# Patient Record
Sex: Male | Born: 1940 | Race: White | Hispanic: No | Marital: Married | Smoking: Former smoker
Health system: Southern US, Community
[De-identification: ages and names within clinical notes are randomized; demographics above are authoritative.]

## PROBLEM LIST (undated history)

## (undated) DIAGNOSIS — I2510 Atherosclerotic heart disease of native coronary artery without angina pectoris: Secondary | ICD-10-CM

## (undated) DIAGNOSIS — D801 Nonfamilial hypogammaglobulinemia: Secondary | ICD-10-CM

## (undated) DIAGNOSIS — Z8679 Personal history of other diseases of the circulatory system: Secondary | ICD-10-CM

## (undated) DIAGNOSIS — E785 Hyperlipidemia, unspecified: Secondary | ICD-10-CM

## (undated) DIAGNOSIS — I10 Essential (primary) hypertension: Secondary | ICD-10-CM

## (undated) DIAGNOSIS — K219 Gastro-esophageal reflux disease without esophagitis: Secondary | ICD-10-CM

## (undated) DIAGNOSIS — I719 Aortic aneurysm of unspecified site, without rupture: Secondary | ICD-10-CM

## (undated) DIAGNOSIS — C61 Malignant neoplasm of prostate: Secondary | ICD-10-CM

## (undated) DIAGNOSIS — I519 Heart disease, unspecified: Secondary | ICD-10-CM

## (undated) DIAGNOSIS — Z8551 Personal history of malignant neoplasm of bladder: Secondary | ICD-10-CM

## (undated) DIAGNOSIS — R0602 Shortness of breath: Secondary | ICD-10-CM

## (undated) DIAGNOSIS — C8590 Non-Hodgkin lymphoma, unspecified, unspecified site: Secondary | ICD-10-CM

## (undated) DIAGNOSIS — I672 Cerebral atherosclerosis: Secondary | ICD-10-CM

## (undated) DIAGNOSIS — N320 Bladder-neck obstruction: Secondary | ICD-10-CM

## (undated) DIAGNOSIS — J449 Chronic obstructive pulmonary disease, unspecified: Secondary | ICD-10-CM

## (undated) DIAGNOSIS — F329 Major depressive disorder, single episode, unspecified: Secondary | ICD-10-CM

## (undated) DIAGNOSIS — I509 Heart failure, unspecified: Secondary | ICD-10-CM

## (undated) DIAGNOSIS — R918 Other nonspecific abnormal finding of lung field: Secondary | ICD-10-CM

## (undated) DIAGNOSIS — Z9889 Other specified postprocedural states: Secondary | ICD-10-CM

## (undated) DIAGNOSIS — M1990 Unspecified osteoarthritis, unspecified site: Secondary | ICD-10-CM

## (undated) DIAGNOSIS — R112 Nausea with vomiting, unspecified: Secondary | ICD-10-CM

## (undated) DIAGNOSIS — T4145XA Adverse effect of unspecified anesthetic, initial encounter: Secondary | ICD-10-CM

## (undated) DIAGNOSIS — F32A Depression, unspecified: Secondary | ICD-10-CM

## (undated) DIAGNOSIS — N200 Calculus of kidney: Secondary | ICD-10-CM

## (undated) DIAGNOSIS — I739 Peripheral vascular disease, unspecified: Secondary | ICD-10-CM

## (undated) DIAGNOSIS — B029 Zoster without complications: Secondary | ICD-10-CM

## (undated) DIAGNOSIS — I219 Acute myocardial infarction, unspecified: Secondary | ICD-10-CM

## (undated) DIAGNOSIS — R011 Cardiac murmur, unspecified: Secondary | ICD-10-CM

## (undated) DIAGNOSIS — C801 Malignant (primary) neoplasm, unspecified: Secondary | ICD-10-CM

## (undated) DIAGNOSIS — D649 Anemia, unspecified: Secondary | ICD-10-CM

## (undated) DIAGNOSIS — N321 Vesicointestinal fistula: Secondary | ICD-10-CM

## (undated) DIAGNOSIS — T8859XA Other complications of anesthesia, initial encounter: Secondary | ICD-10-CM

## (undated) DIAGNOSIS — I480 Paroxysmal atrial fibrillation: Secondary | ICD-10-CM

## (undated) DIAGNOSIS — I639 Cerebral infarction, unspecified: Secondary | ICD-10-CM

## (undated) DIAGNOSIS — G4730 Sleep apnea, unspecified: Secondary | ICD-10-CM

## (undated) HISTORY — PX: COLON SURGERY: SHX602

## (undated) HISTORY — DX: Zoster without complications: B02.9

## (undated) HISTORY — DX: Gastro-esophageal reflux disease without esophagitis: K21.9

## (undated) HISTORY — DX: Chronic obstructive pulmonary disease, unspecified: J44.9

## (undated) HISTORY — DX: Aortic aneurysm of unspecified site, without rupture: I71.9

## (undated) HISTORY — DX: Calculus of kidney: N20.0

## (undated) HISTORY — DX: Paroxysmal atrial fibrillation: I48.0

## (undated) HISTORY — DX: Anemia, unspecified: D64.9

## (undated) HISTORY — DX: Non-Hodgkin lymphoma, unspecified, unspecified site: C85.90

## (undated) HISTORY — DX: Vesicointestinal fistula: N32.1

## (undated) HISTORY — DX: Unspecified osteoarthritis, unspecified site: M19.90

## (undated) HISTORY — DX: Hyperlipidemia, unspecified: E78.5

## (undated) HISTORY — DX: Personal history of other diseases of the circulatory system: Z86.79

## (undated) HISTORY — DX: Acute myocardial infarction, unspecified: I21.9

## (undated) HISTORY — DX: Bladder-neck obstruction: N32.0

## (undated) HISTORY — DX: Cerebral atherosclerosis: I67.2

## (undated) HISTORY — PX: OTHER SURGICAL HISTORY: SHX169

## (undated) HISTORY — DX: Heart failure, unspecified: I50.9

## (undated) HISTORY — PX: PROSTATE SURGERY: SHX751

## (undated) HISTORY — PX: ROTATOR CUFF REPAIR: SHX139

## (undated) HISTORY — DX: Essential (primary) hypertension: I10

## (undated) HISTORY — PX: INSERT / REPLACE / REMOVE PACEMAKER: SUR710

## (undated) HISTORY — DX: Other nonspecific abnormal finding of lung field: R91.8

## (undated) HISTORY — DX: Nonfamilial hypogammaglobulinemia: D80.1

## (undated) HISTORY — PX: WRIST SURGERY: SHX841

## (undated) HISTORY — DX: Peripheral vascular disease, unspecified: I73.9

## (undated) HISTORY — PX: BLADDER SURGERY: SHX569

## (undated) HISTORY — DX: Malignant neoplasm of prostate: C61

## (undated) HISTORY — DX: Heart disease, unspecified: I51.9

## (undated) HISTORY — DX: Cardiac murmur, unspecified: R01.1

## (undated) SURGERY — ECHOCARDIOGRAM, TRANSESOPHAGEAL
Anesthesia: Moderate Sedation

---

## 1997-11-11 HISTORY — PX: OTHER SURGICAL HISTORY: SHX169

## 1998-11-11 DIAGNOSIS — C61 Malignant neoplasm of prostate: Secondary | ICD-10-CM

## 1998-11-11 HISTORY — DX: Malignant neoplasm of prostate: C61

## 2000-06-24 ENCOUNTER — Encounter: Payer: Self-pay | Admitting: *Deleted

## 2000-06-24 ENCOUNTER — Inpatient Hospital Stay (HOSPITAL_COMMUNITY): Admission: RE | Admit: 2000-06-24 | Discharge: 2000-06-26 | Payer: Self-pay | Admitting: Cardiovascular Disease

## 2000-06-24 HISTORY — PX: CORONARY ANGIOPLASTY: SHX604

## 2001-04-23 ENCOUNTER — Other Ambulatory Visit: Admission: RE | Admit: 2001-04-23 | Discharge: 2001-04-23 | Payer: Self-pay | Admitting: Urology

## 2001-04-23 ENCOUNTER — Encounter (INDEPENDENT_AMBULATORY_CARE_PROVIDER_SITE_OTHER): Payer: Self-pay | Admitting: Specialist

## 2001-05-05 ENCOUNTER — Ambulatory Visit: Admission: RE | Admit: 2001-05-05 | Discharge: 2001-08-03 | Payer: Self-pay | Admitting: Radiation Oncology

## 2001-06-17 ENCOUNTER — Encounter: Payer: Self-pay | Admitting: Urology

## 2001-06-18 ENCOUNTER — Encounter: Payer: Self-pay | Admitting: Urology

## 2001-06-18 ENCOUNTER — Ambulatory Visit (HOSPITAL_COMMUNITY): Admission: RE | Admit: 2001-06-18 | Discharge: 2001-06-18 | Payer: Self-pay | Admitting: Urology

## 2001-06-24 ENCOUNTER — Encounter (INDEPENDENT_AMBULATORY_CARE_PROVIDER_SITE_OTHER): Payer: Self-pay | Admitting: Specialist

## 2001-06-24 ENCOUNTER — Inpatient Hospital Stay (HOSPITAL_COMMUNITY): Admission: RE | Admit: 2001-06-24 | Discharge: 2001-06-29 | Payer: Self-pay | Admitting: Urology

## 2003-12-30 ENCOUNTER — Ambulatory Visit (HOSPITAL_COMMUNITY): Admission: RE | Admit: 2003-12-30 | Discharge: 2003-12-30 | Payer: Self-pay | Admitting: Internal Medicine

## 2005-04-30 ENCOUNTER — Ambulatory Visit (HOSPITAL_COMMUNITY): Admission: RE | Admit: 2005-04-30 | Discharge: 2005-04-30 | Payer: Self-pay | Admitting: Family Medicine

## 2005-08-27 ENCOUNTER — Ambulatory Visit (HOSPITAL_COMMUNITY): Admission: RE | Admit: 2005-08-27 | Discharge: 2005-08-27 | Payer: Self-pay | Admitting: Family Medicine

## 2006-04-08 ENCOUNTER — Ambulatory Visit (HOSPITAL_COMMUNITY): Admission: RE | Admit: 2006-04-08 | Discharge: 2006-04-08 | Payer: Self-pay | Admitting: Cardiovascular Disease

## 2006-04-17 ENCOUNTER — Ambulatory Visit (HOSPITAL_COMMUNITY): Payer: Self-pay | Admitting: Oncology

## 2006-04-17 ENCOUNTER — Encounter (HOSPITAL_COMMUNITY): Admission: RE | Admit: 2006-04-17 | Discharge: 2006-05-17 | Payer: Self-pay | Admitting: Oncology

## 2006-04-17 ENCOUNTER — Encounter: Admission: RE | Admit: 2006-04-17 | Discharge: 2006-04-17 | Payer: Self-pay | Admitting: Oncology

## 2006-04-22 ENCOUNTER — Ambulatory Visit (HOSPITAL_COMMUNITY): Admission: RE | Admit: 2006-04-22 | Discharge: 2006-04-22 | Payer: Self-pay | Admitting: Oncology

## 2006-05-15 ENCOUNTER — Encounter (INDEPENDENT_AMBULATORY_CARE_PROVIDER_SITE_OTHER): Payer: Self-pay | Admitting: General Surgery

## 2006-05-15 ENCOUNTER — Ambulatory Visit (HOSPITAL_COMMUNITY): Admission: RE | Admit: 2006-05-15 | Discharge: 2006-05-15 | Payer: Self-pay | Admitting: General Surgery

## 2006-05-15 ENCOUNTER — Encounter (INDEPENDENT_AMBULATORY_CARE_PROVIDER_SITE_OTHER): Payer: Self-pay | Admitting: Specialist

## 2006-05-20 ENCOUNTER — Encounter: Admission: RE | Admit: 2006-05-20 | Discharge: 2006-05-20 | Payer: Self-pay | Admitting: Oncology

## 2006-05-20 ENCOUNTER — Encounter (HOSPITAL_COMMUNITY): Admission: RE | Admit: 2006-05-20 | Discharge: 2006-06-19 | Payer: Self-pay | Admitting: Oncology

## 2006-05-26 ENCOUNTER — Ambulatory Visit (HOSPITAL_COMMUNITY): Admission: RE | Admit: 2006-05-26 | Discharge: 2006-05-26 | Payer: Self-pay | Admitting: Oncology

## 2006-06-16 ENCOUNTER — Ambulatory Visit (HOSPITAL_COMMUNITY): Payer: Self-pay | Admitting: Oncology

## 2006-07-07 ENCOUNTER — Encounter (HOSPITAL_COMMUNITY): Admission: RE | Admit: 2006-07-07 | Discharge: 2006-08-06 | Payer: Self-pay | Admitting: Oncology

## 2006-07-22 ENCOUNTER — Ambulatory Visit (HOSPITAL_COMMUNITY): Admission: RE | Admit: 2006-07-22 | Discharge: 2006-07-22 | Payer: Self-pay | Admitting: Oncology

## 2006-08-18 ENCOUNTER — Encounter: Admission: RE | Admit: 2006-08-18 | Discharge: 2006-08-18 | Payer: Self-pay | Admitting: Oncology

## 2006-08-18 ENCOUNTER — Ambulatory Visit (HOSPITAL_COMMUNITY): Payer: Self-pay | Admitting: Oncology

## 2006-08-18 ENCOUNTER — Encounter (HOSPITAL_COMMUNITY): Admission: RE | Admit: 2006-08-18 | Discharge: 2006-09-17 | Payer: Self-pay | Admitting: Oncology

## 2006-09-22 ENCOUNTER — Ambulatory Visit (HOSPITAL_COMMUNITY): Admission: RE | Admit: 2006-09-22 | Discharge: 2006-09-22 | Payer: Self-pay | Admitting: Oncology

## 2006-09-23 ENCOUNTER — Encounter (HOSPITAL_COMMUNITY): Admission: RE | Admit: 2006-09-23 | Discharge: 2006-10-23 | Payer: Self-pay | Admitting: Oncology

## 2006-09-23 ENCOUNTER — Encounter: Admission: RE | Admit: 2006-09-23 | Discharge: 2006-09-23 | Payer: Self-pay | Admitting: Oncology

## 2006-10-13 ENCOUNTER — Encounter (INDEPENDENT_AMBULATORY_CARE_PROVIDER_SITE_OTHER): Payer: Self-pay | Admitting: *Deleted

## 2006-10-13 ENCOUNTER — Inpatient Hospital Stay (HOSPITAL_COMMUNITY): Admission: RE | Admit: 2006-10-13 | Discharge: 2006-10-24 | Payer: Self-pay | Admitting: General Surgery

## 2006-11-05 ENCOUNTER — Ambulatory Visit (HOSPITAL_COMMUNITY): Payer: Self-pay | Admitting: Oncology

## 2006-12-17 ENCOUNTER — Encounter (HOSPITAL_COMMUNITY): Admission: RE | Admit: 2006-12-17 | Discharge: 2007-01-16 | Payer: Self-pay | Admitting: Oncology

## 2006-12-29 ENCOUNTER — Ambulatory Visit (HOSPITAL_COMMUNITY): Payer: Self-pay | Admitting: Oncology

## 2006-12-29 ENCOUNTER — Ambulatory Visit (HOSPITAL_COMMUNITY): Admission: RE | Admit: 2006-12-29 | Discharge: 2006-12-29 | Payer: Self-pay | Admitting: Oncology

## 2007-03-09 ENCOUNTER — Ambulatory Visit (HOSPITAL_COMMUNITY): Payer: Self-pay | Admitting: Oncology

## 2007-03-09 ENCOUNTER — Encounter (HOSPITAL_COMMUNITY): Admission: RE | Admit: 2007-03-09 | Discharge: 2007-04-08 | Payer: Self-pay | Admitting: Oncology

## 2007-05-12 ENCOUNTER — Ambulatory Visit (HOSPITAL_COMMUNITY): Payer: Self-pay | Admitting: Oncology

## 2007-06-24 ENCOUNTER — Encounter (HOSPITAL_COMMUNITY): Admission: RE | Admit: 2007-06-24 | Discharge: 2007-07-24 | Payer: Self-pay | Admitting: Oncology

## 2007-06-29 ENCOUNTER — Ambulatory Visit (HOSPITAL_COMMUNITY): Payer: Self-pay | Admitting: Oncology

## 2007-07-22 ENCOUNTER — Ambulatory Visit (HOSPITAL_COMMUNITY): Admission: RE | Admit: 2007-07-22 | Discharge: 2007-07-22 | Payer: Self-pay | Admitting: Family Medicine

## 2007-09-10 ENCOUNTER — Ambulatory Visit (HOSPITAL_COMMUNITY): Admission: RE | Admit: 2007-09-10 | Discharge: 2007-09-10 | Payer: Self-pay | Admitting: Oncology

## 2007-09-11 ENCOUNTER — Ambulatory Visit (HOSPITAL_COMMUNITY): Payer: Self-pay | Admitting: Oncology

## 2007-09-11 ENCOUNTER — Encounter (HOSPITAL_COMMUNITY): Admission: RE | Admit: 2007-09-11 | Discharge: 2007-10-11 | Payer: Self-pay | Admitting: Oncology

## 2007-11-13 ENCOUNTER — Ambulatory Visit (HOSPITAL_COMMUNITY): Admission: RE | Admit: 2007-11-13 | Discharge: 2007-11-13 | Payer: Self-pay | Admitting: Family Medicine

## 2007-11-16 ENCOUNTER — Ambulatory Visit (HOSPITAL_COMMUNITY): Payer: Self-pay | Admitting: Oncology

## 2007-11-27 HISTORY — PX: NM MYOCAR PERF WALL MOTION: HXRAD629

## 2008-02-08 ENCOUNTER — Ambulatory Visit (HOSPITAL_COMMUNITY): Payer: Self-pay | Admitting: Oncology

## 2008-02-08 ENCOUNTER — Encounter (HOSPITAL_COMMUNITY): Admission: RE | Admit: 2008-02-08 | Discharge: 2008-03-09 | Payer: Self-pay | Admitting: Oncology

## 2008-03-10 ENCOUNTER — Ambulatory Visit (HOSPITAL_COMMUNITY): Admission: RE | Admit: 2008-03-10 | Discharge: 2008-03-10 | Payer: Self-pay | Admitting: Family Medicine

## 2008-03-28 ENCOUNTER — Ambulatory Visit (HOSPITAL_COMMUNITY): Payer: Self-pay | Admitting: Oncology

## 2008-05-16 ENCOUNTER — Ambulatory Visit (HOSPITAL_COMMUNITY): Payer: Self-pay | Admitting: Oncology

## 2008-08-08 ENCOUNTER — Encounter (HOSPITAL_COMMUNITY): Admission: RE | Admit: 2008-08-08 | Discharge: 2008-09-07 | Payer: Self-pay | Admitting: Oncology

## 2008-08-08 ENCOUNTER — Ambulatory Visit (HOSPITAL_COMMUNITY): Payer: Self-pay | Admitting: Oncology

## 2008-08-22 ENCOUNTER — Ambulatory Visit (HOSPITAL_COMMUNITY): Admission: RE | Admit: 2008-08-22 | Discharge: 2008-08-22 | Payer: Self-pay | Admitting: Oncology

## 2008-09-19 ENCOUNTER — Encounter (HOSPITAL_COMMUNITY): Admission: RE | Admit: 2008-09-19 | Discharge: 2008-10-19 | Payer: Self-pay | Admitting: Oncology

## 2008-10-03 ENCOUNTER — Ambulatory Visit: Payer: Self-pay | Admitting: Thoracic Surgery

## 2008-10-03 ENCOUNTER — Ambulatory Visit (HOSPITAL_COMMUNITY): Admission: RE | Admit: 2008-10-03 | Discharge: 2008-10-03 | Payer: Self-pay | Admitting: Thoracic Surgery

## 2008-10-04 ENCOUNTER — Ambulatory Visit: Payer: Self-pay | Admitting: Thoracic Surgery

## 2008-11-24 ENCOUNTER — Ambulatory Visit (HOSPITAL_COMMUNITY): Admission: RE | Admit: 2008-11-24 | Discharge: 2008-11-24 | Payer: Self-pay | Admitting: Family Medicine

## 2009-02-27 ENCOUNTER — Ambulatory Visit (HOSPITAL_COMMUNITY): Payer: Self-pay | Admitting: Oncology

## 2009-02-27 ENCOUNTER — Encounter (HOSPITAL_COMMUNITY): Admission: RE | Admit: 2009-02-27 | Discharge: 2009-03-29 | Payer: Self-pay | Admitting: Oncology

## 2009-03-07 ENCOUNTER — Encounter (HOSPITAL_COMMUNITY): Payer: Self-pay | Admitting: Oncology

## 2009-03-09 ENCOUNTER — Ambulatory Visit (HOSPITAL_COMMUNITY): Admission: RE | Admit: 2009-03-09 | Discharge: 2009-03-09 | Payer: Self-pay | Admitting: Oncology

## 2009-03-29 ENCOUNTER — Ambulatory Visit (HOSPITAL_COMMUNITY): Admission: RE | Admit: 2009-03-29 | Discharge: 2009-03-29 | Payer: Self-pay | Admitting: Oncology

## 2009-03-31 ENCOUNTER — Encounter (HOSPITAL_COMMUNITY): Admission: RE | Admit: 2009-03-31 | Discharge: 2009-04-30 | Payer: Self-pay | Admitting: Oncology

## 2009-04-17 ENCOUNTER — Ambulatory Visit (HOSPITAL_COMMUNITY): Payer: Self-pay | Admitting: Oncology

## 2009-05-01 ENCOUNTER — Encounter (HOSPITAL_COMMUNITY): Admission: RE | Admit: 2009-05-01 | Discharge: 2009-05-31 | Payer: Self-pay | Admitting: Oncology

## 2009-06-06 ENCOUNTER — Ambulatory Visit (HOSPITAL_COMMUNITY): Payer: Self-pay | Admitting: Oncology

## 2009-06-06 ENCOUNTER — Encounter (HOSPITAL_COMMUNITY): Admission: RE | Admit: 2009-06-06 | Discharge: 2009-07-06 | Payer: Self-pay | Admitting: Oncology

## 2009-07-10 ENCOUNTER — Encounter (HOSPITAL_COMMUNITY): Admission: RE | Admit: 2009-07-10 | Discharge: 2009-08-10 | Payer: Self-pay | Admitting: Oncology

## 2009-07-26 HISTORY — PX: PORTACATH PLACEMENT: SHX2246

## 2009-07-29 ENCOUNTER — Ambulatory Visit (HOSPITAL_COMMUNITY): Payer: Self-pay | Admitting: Oncology

## 2009-08-10 ENCOUNTER — Other Ambulatory Visit (HOSPITAL_COMMUNITY): Payer: Self-pay | Admitting: Oncology

## 2009-09-11 ENCOUNTER — Encounter (HOSPITAL_COMMUNITY): Admission: RE | Admit: 2009-09-11 | Discharge: 2009-10-11 | Payer: Self-pay | Admitting: Oncology

## 2009-09-18 ENCOUNTER — Ambulatory Visit (HOSPITAL_COMMUNITY): Payer: Self-pay | Admitting: Oncology

## 2009-11-11 HISTORY — PX: BONE MARROW TRANSPLANT: SHX200

## 2009-11-24 ENCOUNTER — Encounter (HOSPITAL_COMMUNITY): Admission: RE | Admit: 2009-11-24 | Discharge: 2009-12-24 | Payer: Self-pay | Admitting: Oncology

## 2009-11-24 ENCOUNTER — Ambulatory Visit (HOSPITAL_COMMUNITY): Payer: Self-pay | Admitting: Oncology

## 2009-12-28 ENCOUNTER — Encounter (HOSPITAL_COMMUNITY): Admission: RE | Admit: 2009-12-28 | Discharge: 2010-01-27 | Payer: Self-pay | Admitting: Oncology

## 2010-01-18 ENCOUNTER — Ambulatory Visit (HOSPITAL_COMMUNITY): Payer: Self-pay | Admitting: Oncology

## 2010-01-24 ENCOUNTER — Ambulatory Visit (HOSPITAL_COMMUNITY): Admission: RE | Admit: 2010-01-24 | Discharge: 2010-01-24 | Payer: Self-pay | Admitting: Family Medicine

## 2010-01-25 ENCOUNTER — Encounter (HOSPITAL_COMMUNITY): Admission: RE | Admit: 2010-01-25 | Discharge: 2010-02-24 | Payer: Self-pay | Admitting: Oncology

## 2010-02-28 ENCOUNTER — Encounter (HOSPITAL_COMMUNITY): Admission: RE | Admit: 2010-02-28 | Discharge: 2010-03-30 | Payer: Self-pay | Admitting: Oncology

## 2010-03-14 ENCOUNTER — Encounter: Payer: Self-pay | Admitting: Internal Medicine

## 2010-03-19 ENCOUNTER — Ambulatory Visit (HOSPITAL_COMMUNITY): Admission: RE | Admit: 2010-03-19 | Discharge: 2010-03-19 | Payer: Self-pay | Admitting: Family Medicine

## 2010-03-21 DIAGNOSIS — Z8572 Personal history of non-Hodgkin lymphomas: Secondary | ICD-10-CM | POA: Insufficient documentation

## 2010-03-21 DIAGNOSIS — R972 Elevated prostate specific antigen [PSA]: Secondary | ICD-10-CM | POA: Insufficient documentation

## 2010-03-21 DIAGNOSIS — F411 Generalized anxiety disorder: Secondary | ICD-10-CM

## 2010-03-21 DIAGNOSIS — I10 Essential (primary) hypertension: Secondary | ICD-10-CM | POA: Insufficient documentation

## 2010-03-21 DIAGNOSIS — I219 Acute myocardial infarction, unspecified: Secondary | ICD-10-CM | POA: Insufficient documentation

## 2010-03-21 DIAGNOSIS — Z9889 Other specified postprocedural states: Secondary | ICD-10-CM | POA: Insufficient documentation

## 2010-03-21 DIAGNOSIS — C9001 Multiple myeloma in remission: Secondary | ICD-10-CM

## 2010-03-21 DIAGNOSIS — I2510 Atherosclerotic heart disease of native coronary artery without angina pectoris: Secondary | ICD-10-CM | POA: Insufficient documentation

## 2010-03-21 DIAGNOSIS — N200 Calculus of kidney: Secondary | ICD-10-CM

## 2010-03-22 ENCOUNTER — Ambulatory Visit: Payer: Self-pay | Admitting: Internal Medicine

## 2010-03-22 DIAGNOSIS — R930 Abnormal findings on diagnostic imaging of skull and head, not elsewhere classified: Secondary | ICD-10-CM

## 2010-03-22 DIAGNOSIS — J45909 Unspecified asthma, uncomplicated: Secondary | ICD-10-CM | POA: Insufficient documentation

## 2010-03-23 ENCOUNTER — Ambulatory Visit (HOSPITAL_COMMUNITY): Payer: Self-pay | Admitting: Oncology

## 2010-03-26 ENCOUNTER — Ambulatory Visit (HOSPITAL_COMMUNITY): Admission: RE | Admit: 2010-03-26 | Discharge: 2010-03-26 | Payer: Self-pay | Admitting: Oncology

## 2010-04-03 ENCOUNTER — Encounter (HOSPITAL_COMMUNITY): Admission: RE | Admit: 2010-04-03 | Discharge: 2010-05-03 | Payer: Self-pay | Admitting: Oncology

## 2010-04-03 ENCOUNTER — Ambulatory Visit: Payer: Self-pay | Admitting: Internal Medicine

## 2010-04-03 ENCOUNTER — Encounter: Payer: Self-pay | Admitting: Internal Medicine

## 2010-04-03 ENCOUNTER — Telehealth (INDEPENDENT_AMBULATORY_CARE_PROVIDER_SITE_OTHER): Payer: Self-pay | Admitting: *Deleted

## 2010-04-03 DIAGNOSIS — B370 Candidal stomatitis: Secondary | ICD-10-CM

## 2010-04-10 ENCOUNTER — Ambulatory Visit: Payer: Self-pay | Admitting: Internal Medicine

## 2010-04-18 ENCOUNTER — Telehealth: Payer: Self-pay | Admitting: Adult Health

## 2010-04-19 ENCOUNTER — Ambulatory Visit: Payer: Self-pay | Admitting: Internal Medicine

## 2010-04-20 LAB — CONVERTED CEMR LAB
Pro B Natriuretic peptide (BNP): 119 pg/mL — ABNORMAL HIGH (ref 0.0–100.0)
Sed Rate: 59 mm/hr — ABNORMAL HIGH (ref 0–22)

## 2010-04-26 ENCOUNTER — Ambulatory Visit: Payer: Self-pay | Admitting: Internal Medicine

## 2010-04-26 ENCOUNTER — Ambulatory Visit: Payer: Self-pay | Admitting: Cardiology

## 2010-04-26 DIAGNOSIS — R05 Cough: Secondary | ICD-10-CM

## 2010-05-10 ENCOUNTER — Ambulatory Visit: Payer: Self-pay | Admitting: Internal Medicine

## 2010-05-10 LAB — CONVERTED CEMR LAB
Basophils Absolute: 0 10*3/uL (ref 0.0–0.1)
Eosinophils Absolute: 0 10*3/uL (ref 0.0–0.7)
Hemoglobin: 10.6 g/dL — ABNORMAL LOW (ref 13.0–17.0)
Lymphocytes Relative: 24.3 % (ref 12.0–46.0)
MCHC: 33.5 g/dL (ref 30.0–36.0)
Monocytes Relative: 8.2 % (ref 3.0–12.0)
Neutro Abs: 2.7 10*3/uL (ref 1.4–7.7)
Neutrophils Relative %: 66.5 % (ref 43.0–77.0)
RBC: 3.27 M/uL — ABNORMAL LOW (ref 4.22–5.81)
RDW: 19.2 % — ABNORMAL HIGH (ref 11.5–14.6)

## 2010-05-15 ENCOUNTER — Ambulatory Visit (HOSPITAL_COMMUNITY): Payer: Self-pay | Admitting: Oncology

## 2010-05-15 ENCOUNTER — Encounter (HOSPITAL_COMMUNITY): Admission: RE | Admit: 2010-05-15 | Discharge: 2010-06-14 | Payer: Self-pay | Admitting: Oncology

## 2010-05-15 LAB — CONVERTED CEMR LAB: A-1 Antitrypsin, Ser: 182 mg/dL (ref 83–200)

## 2010-05-17 ENCOUNTER — Encounter: Payer: Self-pay | Admitting: Internal Medicine

## 2010-05-23 ENCOUNTER — Telehealth (INDEPENDENT_AMBULATORY_CARE_PROVIDER_SITE_OTHER): Payer: Self-pay | Admitting: *Deleted

## 2010-05-24 ENCOUNTER — Ambulatory Visit: Payer: Self-pay | Admitting: Internal Medicine

## 2010-05-31 ENCOUNTER — Encounter: Payer: Self-pay | Admitting: Internal Medicine

## 2010-06-18 ENCOUNTER — Ambulatory Visit (HOSPITAL_COMMUNITY): Admission: RE | Admit: 2010-06-18 | Discharge: 2010-06-18 | Payer: Self-pay | Admitting: Family Medicine

## 2010-06-28 ENCOUNTER — Encounter (HOSPITAL_COMMUNITY): Admission: RE | Admit: 2010-06-28 | Discharge: 2010-07-28 | Payer: Self-pay | Admitting: Oncology

## 2010-08-02 ENCOUNTER — Ambulatory Visit (HOSPITAL_COMMUNITY): Payer: Self-pay | Admitting: Hematology and Oncology

## 2010-08-02 ENCOUNTER — Ambulatory Visit (HOSPITAL_COMMUNITY): Payer: Self-pay | Admitting: Oncology

## 2010-08-02 ENCOUNTER — Encounter (HOSPITAL_COMMUNITY): Admission: RE | Admit: 2010-08-02 | Discharge: 2010-08-10 | Payer: Self-pay | Admitting: Oncology

## 2010-08-14 ENCOUNTER — Encounter (HOSPITAL_COMMUNITY): Admission: RE | Admit: 2010-08-14 | Discharge: 2010-09-13 | Payer: Self-pay | Admitting: Oncology

## 2010-08-14 ENCOUNTER — Encounter: Payer: Self-pay | Admitting: Internal Medicine

## 2010-09-25 ENCOUNTER — Encounter (HOSPITAL_COMMUNITY)
Admission: RE | Admit: 2010-09-25 | Discharge: 2010-10-25 | Payer: Self-pay | Source: Home / Self Care | Attending: Oncology | Admitting: Oncology

## 2010-09-25 ENCOUNTER — Ambulatory Visit (HOSPITAL_COMMUNITY): Payer: Self-pay | Admitting: Oncology

## 2010-10-25 ENCOUNTER — Encounter (HOSPITAL_COMMUNITY)
Admission: RE | Admit: 2010-10-25 | Discharge: 2010-11-24 | Payer: Self-pay | Source: Home / Self Care | Attending: Oncology | Admitting: Oncology

## 2010-11-13 ENCOUNTER — Encounter: Payer: Self-pay | Admitting: Internal Medicine

## 2010-11-13 ENCOUNTER — Ambulatory Visit (HOSPITAL_COMMUNITY)
Admission: RE | Admit: 2010-11-13 | Discharge: 2010-12-11 | Payer: Self-pay | Source: Home / Self Care | Attending: Oncology | Admitting: Oncology

## 2010-11-26 LAB — CBC
HCT: 36.4 % — ABNORMAL LOW (ref 39.0–52.0)
Hemoglobin: 12.4 g/dL — ABNORMAL LOW (ref 13.0–17.0)
MCH: 30.7 pg (ref 26.0–34.0)
MCHC: 34.1 g/dL (ref 30.0–36.0)
MCV: 90.1 fL (ref 78.0–100.0)
Platelets: 125 10*3/uL — ABNORMAL LOW (ref 150–400)
RBC: 4.04 MIL/uL — ABNORMAL LOW (ref 4.22–5.81)
RDW: 14.9 % (ref 11.5–15.5)
WBC: 3.8 10*3/uL — ABNORMAL LOW (ref 4.0–10.5)

## 2010-11-26 LAB — DIFFERENTIAL
Basophils Absolute: 0.1 10*3/uL (ref 0.0–0.1)
Basophils Relative: 2 % — ABNORMAL HIGH (ref 0–1)
Eosinophils Absolute: 0.2 10*3/uL (ref 0.0–0.7)
Eosinophils Relative: 6 % — ABNORMAL HIGH (ref 0–5)
Lymphocytes Relative: 49 % — ABNORMAL HIGH (ref 12–46)
Lymphs Abs: 1.9 10*3/uL (ref 0.7–4.0)
Monocytes Absolute: 0.3 10*3/uL (ref 0.1–1.0)
Monocytes Relative: 8 % (ref 3–12)
Neutro Abs: 1.3 10*3/uL — ABNORMAL LOW (ref 1.7–7.7)
Neutrophils Relative %: 34 % — ABNORMAL LOW (ref 43–77)

## 2010-11-26 LAB — COMPREHENSIVE METABOLIC PANEL
ALT: 27 U/L (ref 0–53)
AST: 33 U/L (ref 0–37)
Albumin: 4 g/dL (ref 3.5–5.2)
Alkaline Phosphatase: 49 U/L (ref 39–117)
BUN: 10 mg/dL (ref 6–23)
CO2: 26 mEq/L (ref 19–32)
Calcium: 9.1 mg/dL (ref 8.4–10.5)
Chloride: 103 mEq/L (ref 96–112)
Creatinine, Ser: 1.03 mg/dL (ref 0.4–1.5)
GFR calc Af Amer: >60 mL/min (ref >60)
GFR calc non Af Amer: >60 mL/min (ref >60)
Glucose, Bld: 65 mg/dL — ABNORMAL LOW (ref 70–99)
Potassium: 4.3 mEq/L (ref 3.5–5.1)
Sodium: 135 mEq/L (ref 135–145)
Total Bilirubin: 0.7 mg/dL (ref 0.3–1.2)
Total Protein: 6.1 g/dL (ref 6.0–8.3)

## 2010-11-26 LAB — IMMUNOFIXATION ELECTROPHORESIS
IgA: 75 mg/dL (ref 68–378)
IgG (Immunoglobin G), Serum: 705 mg/dL (ref 694–1618)
IgM, Serum: 25 mg/dL — ABNORMAL LOW (ref 60–263)
Total Protein ELP: 6.6 g/dL (ref 6.0–8.3)

## 2010-11-26 LAB — PROTEIN ELECTROPHORESIS, SERUM
Albumin ELP: 63 % (ref 55.8–66.1)
Alpha-1-Globulin: 5.4 % — ABNORMAL HIGH (ref 2.9–4.9)
Alpha-2-Globulin: 10.2 % (ref 7.1–11.8)
Beta 2: 4.1 % (ref 3.2–6.5)
Beta Globulin: 6.7 % (ref 4.7–7.2)
Gamma Globulin: 10.6 % — ABNORMAL LOW (ref 11.1–18.8)
M-Spike, %: 0.28 g/dL
Total Protein ELP: 6.4 g/dL (ref 6.0–8.3)

## 2010-12-02 ENCOUNTER — Encounter (HOSPITAL_COMMUNITY): Payer: Self-pay | Admitting: Oncology

## 2010-12-13 NOTE — Progress Notes (Signed)
Summary: ? meds  Phone Note Call from Patient Call back at [phone]   Caller: Patient Call For: tammy P Reason for Call: Talk to Nurse Summary of Call: Should pt continue [facility]?  Please call and advise. Initial call taken by: Eugene Gavia,  Apr 03, 2010 1:15 PM  Follow-up for Phone Call        Continue on dulera , brush/rinse and gargle after  use  make sure they read my  pt instruction sheet -it says that on it.  they can stop delsym and use mucinex dm instead  they should be taking mucinex dm two times a day , tessalon three times a day w/ option of hydrocodone as needed   Follow-up by: Rubye Oaks NP,  Apr 03, 2010 1:26 PM  Additional Follow-up for Phone Call Additional follow up Details #1::        called and spoke with pt's wife.  informed pt's wife of the above information/ TP's recs.  also, wife stated she did have pt instruction sheet, which she did review again and  denied any further questions.Wife verbalized understanding and denied any questions.  Arman Filter LPN  Apr 03, 2010 2:24 PM

## 2010-12-13 NOTE — Assessment & Plan Note (Signed)
Summary: Pulmonary/ ext f/u ov rechallenge with cipro x 10 d    Copy to:  Dr. Lubertha South Primary Provider/Referring Provider:  Dr. Lubertha South  CC:  2 wk followup.  Pt still c/o wheezing.  He states that he is coughing less- mainly only has cough in the am and late evening.  Cough is still prod with yellow sputum.Jeremy Parrish  History of Present Illness: 70 yowm quit smoking in 1996 due to MI no resp symptoms at that point. 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  rx with levaquin now 5/10 and pred 5/10 did help fever but not cough.  on advair and albuterol (but did not initially disclose this) no better.  Apr 03, 2010--Presents for follow up and med review. Pt did not bring his meds with him today.  He was seen last visit for initial pulmoanry consult for cough since 3/11. Last visit  does c/o cough that occ produces yellow mucus, wheezing, increased SOB and occ x77months. Cough is mainly during daytime, does not bother him at night. Last visit changed from advair to dulera. and oil based vitamins stopped.  His cough is no better.   He has been taken off of Revlimid for now --by Oncology due to low blood count per pt (no records are available at this time). He has finshed Levaquin and steroid taper. Does feel some better while on abx and steroids but cough does not go entirely away but when finshed his cough and ntermittent fevers return.   Apr 10, 2010--new med calendar- pt brought all meds with him today.  still coughing though is mostly dry now, wheezing and having SOB - states slightly improved since last OV  April 19, 2010-Presents for acute office visit. Complains that cough no better, now  producing clear/yelllow mucus, still wheezing and  having SOB.  states is worse than at last OV. PET 03/2010, w/ no evidence of reoccurence of lymphoma. 4 wk followup with PFT's.    rec prednisone, allegra and chlortrimeton  April 26, 2010 ov cc same since last ov.  He is still wheezing and coughing up yellow sputum.  States that symptoms are no worse, but no better on max rx for AB.  rec trial off dulera  page 2 May 10, 2010 ov says even augmentin did not affect the color the mucus and spends up to an hour and using hc every 4 hours during the day but no noct flare but noisy breathing.  cipro has worked the best on changing mucus from yellow to white.  Pt denies any significant sore throat, dysphagia, itching, sneezing,  nasal congestion or excess secretions,  fever, chills, sweats, unintended wt loss, pleuritic or exertional cp, hempoptysis, change in activity tolerance  orthopnea pnd or leg swelling. Pt also denies any obvious fluctuation in symptoms with weather or environmental change or other alleviating or aggravating factors.       Current Medications (verified): 1)  Centrum Silver  Tabs (Multiple Vitamins-Minerals) .... Take 1 Tablet By Mouth Once A Day 2)  Caltrate 600+d Plus 600-400 Mg-Unit Tabs (Calcium Carbonate-Vit D-Min) .... Take 1 Tablet By Mouth Two Times A Day 3)  Vitamin B-6 Cr 200 Mg  Cr-Tabs (Pyridoxine Hcl) .Jeremy Parrish.. 1 Once Daily 4)  Klor-Con M20 20 Meq Cr-Tabs (Potassium Chloride Crys Cr) .Jeremy Parrish.. 1 Two Times A Day 5)  Simvastatin 20 Mg Tabs (Simvastatin) .Jeremy Parrish.. 1 Once Daily 6)  Plavix 75 Mg Tabs (Clopidogrel Bisulfate) .Jeremy Parrish.. 1 Once Daily 7)  Amlodipine Besylate 10 Mg Tabs (Amlodipine Besylate) .Jeremy Parrish.. 1 Once Daily 8)  Gabapentin 600 Mg Tabs (Gabapentin) .Jeremy Parrish.. 1 Tablet By Mouth Morning, Noon and Evening and 2 By Mouth At Bedtime 9)  Acyclovir 800 Mg Tabs (Acyclovir) .... Take 1 Tablet By Mouth Two Times A Day 10)  Aspirin 81 Mg Tbec (Aspirin) .... Take 1 Tablet By Mouth Once A Day 11)  Ferrous Sulfate 325 (65 Fe) Mg Tabs (Ferrous Sulfate)  .Jeremy Parrish.. 1 Once Daily 12)  Citalopram Hydrobromide 40 Mg Tabs (Citalopram Hydrobromide) .Jeremy Parrish.. 1 Once Daily 13)  Zantac 150 Mg Tabs (Ranitidine Hcl) .... 2 At Bedtime 14)  Spironolactone 50 Mg Tabs (Spironolactone) .Jeremy Parrish.. 1 Once Daily 15)  Chlor-Trimeton 4 Mg Tabs (Chlorpheniramine Maleate) .Jeremy Parrish.. 1 Four Times A Day 16)  Dexilant 60 Mg Cpdr (Dexlansoprazole) .... Take  One 30-60 Min Before First Meal of The Day 17)  Tylenol Extra Strength 500 Mg Tabs (Acetaminophen) .... Per Bottle 18)  Hydrocodone-Acetaminophen 5-325 Mg Tabs (Hydrocodone-Acetaminophen) .Jeremy Parrish.. 1-2  Every 4 Hrs As Needed For Cough 19)  Alprazolam 0.5 Mg Tabs (Alprazolam) .... 1/2 To 1 Tab Two Times A Day As Needed 20)  Zolpidem Tartrate 10 Mg Tabs (Zolpidem Tartrate) .Jeremy Parrish.. 1 At Bedtime As Needed 21)  Flutter Valve .... Use As Directed  Allergies (verified): 1)  ! 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 MULTIPLE  MYELOMA (ICD-203.00) 2009 s/p bone marrow transplant autologous [facility] Oct 2010 New rx March 82956 = Revlimid  Dr Ignacia Bayley  HYPERTENSION (ICD-401.9) ANXIETY (ICD-300.00) CAD (ICD-414.00) Asthma/ Chronic cough  onset  01/2010     - HFA 25-75% Mar 22, 2010 with coaching     - Sinus CT April 26, 2010 mild thickening only, no AF levels     - Allergy profile sent May 10, 2010   Vital Signs:  Patient profile:   70 year old male Weight:      197 pounds O2 Sat:      97 % on Room air Temp:     97.9 degrees F oral Pulse rate:   69 / minute BP sitting:   100 / 58  (left arm)  Vitals Entered By: Vernie Murders (May 10, 2010 10:11 AM)  O2 Flow:  Room air  Physical Exam  Additional Exam:  amb wm nad with classic pseudowheeze improves with purse lip maneuver  wt 183 Mar 22, 2010 >>183 April 19, 2010 >> 189 April 26, 2010 > 197 May 10, 2010  HEENT mild turbinate edema.  Oropharynx -post pharynx w/ few scattered  white patches. No JVD or cervical adenopathy. Mild accessory muscle hypertrophy. Trachea midline, nl thryroid. Chest was hyperinflated by percussion with diminished breath sounds and moderate increased exp time with upper airway insp > exp wheeze. , scattered rhonchi., psuedowheezing  worse on forced exp. Regular rate and rhythm without murmur gallop or rub or increase P2 or edema.  Abd: no hsm, nl excursion. Ext warm without cyanosis or clubbing.     Impression & Recommendations:  Problem # 1:  COUGH (ICD-786.2) The most common causes of chronic cough in immunocompetent adults include:  upper airway cough syndrome (UACS), previously referred to as postnasal drip syndrome,  caused by variety of rhinosinus conditions; (2) asthma; (3) GERD; (4) chronic bronchitis from cigarette smoking or other inhaled environmental irritants; (5) nonasthmatic eosinophilic bronchitis; and (6) bronchiectasis. These conditions, singly or in combination, have accounted for up to 94% of the causes of chronic cough in prospective studies. This fits best with bronchiectasis and given his h/o best response to cipro may have resistant organisms or MAI but try 10 days of cipro then ? fob   Orders: TLB-Alpha-1-Antitrypsin Total ( Home Kit) MISC (04540) T-Allergy Profile Region II-[place], [place], [place], [place], [place] (5484) TLB-CBC Platelet - w/Differential (85025-CBCD) Est. Patient Level IV (98119) Prescription Created Electronically [phone])  Problem # 2:  ASTHMA, UNSPECIFIED (ICD-493.90) doubt asthma since no worse off dulera   Each maintenance medication was reviewed in detail including most importantly the difference between maintenance and as needed and under what circumstances the prns are to be used. This was done in the context of a medication calendar review which provided the patient with a user-friendly unambiguous mechanism for medication administration and reconciliation and provides an action plan for all active problems. It is  critical that this be shown to every doctor  for modification during the office visit if necessary so the patient can use it as a working document.   Medications Added to Medication List This Visit: 1)  Cipro 750 Mg Tabs (Ciprofloxacin hcl) .... One by mouth twice daily 2)  Prednisone 10 Mg Tabs (Prednisone) .... 4 each am x 2days, 2x2days, 1x2days and stop  Patient Instructions: 1)  Bronchiectasis means tubes are scarred and don't bring up mucus the way they should 2)  Stop chlortab 3)  Use delsym first then add Hydrocodone if needed 4)  Use flutter valve as much as possible 5)  Cipro 750 twice daily x 10 days then return in 2 weeks for ov 6)  Prednisone 10 mg  4 each am x 2days, 2x2days, 1x2days and stop  Prescriptions: HYDROCODONE-ACETAMINOPHEN 5-325 MG TABS (HYDROCODONE-ACETAMINOPHEN) 1-2  every 4 hrs as needed for cough  #40 x 0   Entered by:   Vernie Murders   Authorized by:   Nyoka Cowden MD   Signed by:   Vernie Murders on 05/10/2010   Method used:   Print then Give to Patient   RxID:   [id number] PREDNISONE 10 MG  TABS (PREDNISONE) 4 each am x 2days, 2x2days, 1x2days and stop  #14 x 0   Entered and Authorized by:   Nyoka Cowden MD   Signed by:   Nyoka Cowden MD on 05/10/2010   Method used:   Electronically to        [facility] [address] [phone]* (retail)       [address]       [place], [place]  [address]       Ph: [phone] or [phone]       Fax: [phone]   RxID:   [id number] CIPRO 750 MG  TABS (CIPROFLOXACIN HCL) One by mouth twice daily  #20 x 0   Entered and Authorized by:   Nyoka Cowden MD   Signed by:   Nyoka Cowden MD on 05/10/2010   Method used:   [facility] to        [facility] [phone]* (retail)       [address]  [place], [place]  [address]       Ph: [phone] or [phone]       Fax: [phone]   RxID:   [id number]

## 2010-12-13 NOTE — Assessment & Plan Note (Signed)
Summary: NP follow up - med calendar   Copy to:  Dr. Lubertha South Primary Provider/Referring Provider:  Dr. Lubertha South  CC:  new med calendar - pt did not bring his meds with him today.  pt does c/o cough that occ produces yellow mucus, wheezing, and increased SOB and occ x106months.  History of Present Illness: 70 yowm quit smoking in 1996 due to MI no resp symptoms at that point. 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  rx with levaquin now 5/10 and pred 5/10 did help fever but not cough.  on advair and albuterol (but did not initially disclose this) no better.  Apr 03, 2010--Presents for follow up and med review. Pt did not bring his meds with him today.  He was seen last visit for initial pulmoanry consult for cough since 3/11. Last visit  does c/o cough that occ produces yellow mucus, wheezing, increased SOB and occ x69months. Cough is mainly during daytime, does not bother him at night. Last visit changed from advair to dulera. and oil based vitamins stopped.  His cough is no better. Denies chest pain, dyspnea, orthopnea, hemoptysis, fever, n/v/d, edema, headache, discolored mucus. He does have intermittent fevers. He has been taken off of Revlimid for now by Oncology due to low blood count per pt (no records are available at this time). He has finshed Levaquin and steroid taper. Does feel some better while on abx and steroids but cough does not go entirely away but when finshed his cough and ntermittent fevers return.   Medications Prior to Update: 1)  Klor-Con M20 20 Meq Cr-Tabs (Potassium Chloride Crys Cr) .Marland Kitchen.. 1 Two Times A Day 2)  Citalopram Hydrobromide 40 Mg Tabs (Citalopram Hydrobromide) .Marland Kitchen.. 1 Once Daily 3)  Gabapentin 600 Mg  Tabs (Gabapentin) .Marland Kitchen.. 1 Four Times A Day 4)  Caltrate 600+d Plus 600-400 Mg-Unit Tabs (Calcium Carbonate-Vit D-Min) .Marland Kitchen.. 1 Once Daily 5)  Revlimid 10 Mg Caps (Lenalidomide) .Marland Kitchen.. 1 Once Daily 6)  Spironolactone 50 Mg Tabs (Spironolactone) .Marland Kitchen.. 1 Once Daily 7)  Bayer Aspirin 325 Mg Tabs (Aspirin) .Marland Kitchen.. 1 Once Daily 8)  Vitamin B-6 Cr 200 Mg Cr-Tabs (Pyridoxine Hcl) .Marland Kitchen.. 1 Once Daily 9)  Amlodipine Besylate 10 Mg Tabs (Amlodipine Besylate) .Marland Kitchen.. 1 Once Daily 10)  Simvastatin 20 Mg Tabs (Simvastatin) .Marland Kitchen.. 1 Once Daily 11)  Plavix 75 Mg Tabs (Clopidogrel Bisulfate) .Marland Kitchen.. 1 Once Daily 12)  Folic Acid 1 Mg Tabs (Folic Acid) .Marland Kitchen.. 1 Once Daily 13)  Ferrous Sulfate 325 (65 Fe) Mg Tabs (Ferrous Sulfate) .Marland Kitchen.. 1 Once Daily 14)  Dulera 100-5 Mcg/act Aero (Mometasone Furo-Formoterol Fum) .... 2 Puffs First Thing  in Am and 2 Puffs Again in Pm About 12 Hours Later 15)  Hydrocodone-Acetaminophen 5-325 Mg Tabs (Hydrocodone-Acetaminophen) .Marland Kitchen.. 1 Every 4-6 Hrs As Needed 16)  Zolpidem Tartrate 10 Mg Tabs (Zolpidem Tartrate) .Marland Kitchen.. 1 At Bedtime As Needed 17)  Alprazolam 0.5 Mg Tabs (Alprazolam) .... 1/2 To 1 Tab Two Times A Day As Needed 18)  Lorazepam 1 Mg Tabs (Lorazepam) .Marland Kitchen.. 1 Every 4 Hrs As Needed  Current Medications (verified): 1)  Klor-Con M20 20 Meq Cr-Tabs (Potassium Chloride Crys Cr) .Marland Kitchen.. 1 Two Times A  Day 2)  Citalopram Hydrobromide 40 Mg Tabs (Citalopram Hydrobromide) .Marland Kitchen.. 1 Once Daily 3)  Gabapentin 600 Mg Tabs (Gabapentin) .Marland Kitchen.. 1 Four Times A Day 4)  Caltrate 600+d Plus 600-400 Mg-Unit Tabs (Calcium Carbonate-Vit D-Min) .Marland Kitchen.. 1 Once Daily 5)  Revlimid 10 Mg Caps (Lenalidomide) .Marland Kitchen.. 1 Once Daily 6)  Spironolactone 50 Mg Tabs (Spironolactone) .Marland Kitchen.. 1 Once Daily As Needed 7)  Bayer Aspirin 325 Mg Tabs (Aspirin) .Marland Kitchen.. 1 Once Daily 8)  Vitamin B-6 Cr 200 Mg Cr-Tabs (Pyridoxine Hcl) .Marland Kitchen.. 1 Once Daily 9)  Amlodipine Besylate 10 Mg Tabs (Amlodipine Besylate) .Marland Kitchen.. 1 Once Daily 10)  Simvastatin 20 Mg Tabs  (Simvastatin) .Marland Kitchen.. 1 Once Daily 11)  Plavix 75 Mg Tabs (Clopidogrel Bisulfate) .Marland Kitchen.. 1 Once Daily 12)  Ferrous Sulfate 325 (65 Fe) Mg Tabs (Ferrous Sulfate) .Marland Kitchen.. 1 Once Daily 13)  Dulera 100-5 Mcg/act Aero (Mometasone Furo-Formoterol Fum) .... 2 Puffs First Thing  in Am and 2 Puffs Again in Pm About 12 Hours Later 14)  Hydrocodone-Acetaminophen 5-325 Mg Tabs (Hydrocodone-Acetaminophen) .Marland Kitchen.. 1 Every 4-6 Hrs As Needed 15)  Zolpidem Tartrate 10 Mg Tabs (Zolpidem Tartrate) .Marland Kitchen.. 1 At Bedtime As Needed 16)  Alprazolam 0.5 Mg Tabs (Alprazolam) .... 1/2 To 1 Tab Two Times A Day As Needed 17)  Acyclovir 800 Mg Tabs (Acyclovir) .... Take 1 Tablet By Mouth Two Times A Day 18)  Delsym 30 Mg/68ml Lqcr (Dextromethorphan Polistirex) .... Per Bottle  Allergies (verified): 1)  ! 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  Quit in 1996.  Smoked off and on x 30 yrs approx up to 1 1/2 ppd No ETOH Retired [employer] architect  Review of Systems      See HPI  Vital Signs:  Patient profile:   70 year old male Height:      67 inches Weight:      183 pounds BMI:     28.77 O2 Sat:      97 % on Room air Pulse rate:   65 / minute BP sitting:   142 / 80   (left arm) Cuff size:   regular  Vitals Entered By: Boone Master CNA/MA (Apr 03, 2010 9:15 AM)  O2 Flow:  Room air CC: new med calendar - pt did not bring his meds with him today.  pt does c/o cough that occ produces yellow mucus, wheezing, increased SOB and occ x59months Is Patient Diabetic? No Comments Medications reviewed with patient Daytime contact number verified with patient. Boone Master CNA/MA  Apr 03, 2010 9:15 AM  Ambulatory Pulse Oximetry  Resting; HR__70___    02 Sat__95%RA___  Lap1 (185 feet)   HR_70____   02 Sat__96%RA___ Lap2 (185 feet)   HR_77____   02 Sat__95%RA___    Lap3 (185 feet)   HR_79____   02 Sat__96%RA___  ___Test  Completed without Difficulty ___Test Stopped due to:     Physical Exam  Additional Exam:  amb wm nad with classic pseudowheeze resolves with purse lip maneuver  wt 183 Mar 22, 2010  HEENT mild turbinate edema.  Oropharynx -post pharynx w/ few scattered white patches. No JVD or cervical adenopathy. Mild accessory muscle hypertrophy. Trachea midline, nl thryroid. Chest was hyperinflated by percussion with diminished breath sounds and moderate increased exp time with upper airway insp > exp wheeze. , scattered rhonchi. Regular rate and rhythm without murmur gallop or rub or increase P2 or edema.  Abd: no hsm, nl excursion. Ext warm without cyanosis or clubbing.     Impression & Recommendations:  Problem # 1:  ASTHMA, UNSPECIFIED (ICD-493.90)  Asthmatic bronchitic flare w/ upper airway cough syndrome.  pt is somewheat complicated w/ underlying hx of multiple myleoma s/p bone  marrow transplant recently on Revlimid. 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  Hx of smoking-quit in 1996 No known hx of underlying COPD  no desaturations w/ walking today  will aim tx at slow bronchitic cough aggravated by possible GERD/rhinitis  REC:  Prednisone taper over next week.  Mucinex DM two times a day  Tessalon 200mg  three times a day  If  still coughing may use hydrocodone 1 every 4-6 hr as needed for breakthrough coughing.  Add Zantac 300mg  two times a day- 1 in am and 1 at bedtime  (no PPI -he is on plavix s/p multiple stents. ) Add Zyrtec 10mg  at bedtime  Mycelex troches 5x day for thrush Brush/rinse and gargle after inhaler use.  Try to use sugarless candy, ice chips, water to help w/ cough, try to avoid coughing and throat clearing.  NO MINTS  follow up 1 week and as needed , we will check spirometry (lung function test on return) Increase fluids use tylenol as needed  Please contact office for sooner follow up if symptoms do not improve or worsen   Orders: Depo- Medrol 40mg  (J1030) Depo- Medrol 80mg  (J1040) Admin of Therapeutic Inj  intramuscular or subcutaneous (56213) Est. Patient Level IV (08657) Prescription Created Electronically ([id number])  Problem # 2:  CANDIDIASIS, ORAL (ICD-112.0)  mycelex x 7 days rinse gargle brush after inhalers.   Orders: Est. Patient Level IV (29528) Prescription Created Electronically [phone])  Medications Added to Medication List This Visit: 1)  Spironolactone 50 Mg Tabs (Spironolactone) .Marland Kitchen.. 1 once daily as needed 2)  Acyclovir 800 Mg Tabs (Acyclovir) .... Take 1 tablet by mouth two times a day 3)  Delsym 30 Mg/82ml Lqcr (Dextromethorphan polistirex) .... Per bottle 4)  Tessalon 200 Mg Caps (Benzonatate) .Marland Kitchen.. 1 by mouth three times a day as needed for cough 5)  Prednisone 10 Mg Tabs (Prednisone) .... 4 tabs for 2 days, then 3 tabs for 2 days, 2 tabs for 2 days, then 1 tab for 2 days, then stop 6)  Mycelex 10 Mg Troc (Clotrimazole) .Marland Kitchen.. 1 by mouth 5 x day for 7 days for thrush.  Complete Medication List: 1)  Klor-con M20 20 Meq Cr-tabs (Potassium chloride crys cr) .Marland Kitchen.. 1 two times a day 2)  Citalopram Hydrobromide 40 Mg Tabs (Citalopram hydrobromide) .Marland Kitchen.. 1 once daily 3)  Gabapentin 600 Mg Tabs (Gabapentin) .Marland Kitchen.. 1 four times a day 4)  Caltrate 600+d Plus 600-400 Mg-unit Tabs  (Calcium carbonate-vit d-min) .Marland Kitchen.. 1 once daily 5)  Revlimid 10 Mg Caps (Lenalidomide) .Marland Kitchen.. 1 once daily 6)  Spironolactone 50 Mg Tabs (Spironolactone) .Marland Kitchen.. 1 once daily  as needed 7)  Bayer Aspirin 325 Mg Tabs (Aspirin) .Marland Kitchen.. 1 once daily 8)  Vitamin B-6 Cr 200 Mg Cr-tabs (Pyridoxine hcl) .Marland Kitchen.. 1 once daily 9)  Amlodipine Besylate 10 Mg Tabs (Amlodipine besylate) .Marland Kitchen.. 1 once daily 10)  Simvastatin 20 Mg Tabs (Simvastatin) .Marland Kitchen.. 1 once daily 11)  Plavix 75 Mg Tabs (Clopidogrel bisulfate) .Marland Kitchen.. 1 once daily 12)  Ferrous Sulfate 325 (65 Fe) Mg Tabs (Ferrous sulfate) .Marland Kitchen.. 1 once daily 13)  Dulera 100-5 Mcg/act Aero (Mometasone furo-formoterol fum) .... 2 puffs first thing  in am and 2 puffs again in pm about 12 hours later 14)  Hydrocodone-acetaminophen 5-325 Mg Tabs (Hydrocodone-acetaminophen) .Marland Kitchen.. 1 every 4-6 hrs as needed 15)  Zolpidem Tartrate 10 Mg Tabs (Zolpidem tartrate) .Marland Kitchen.. 1 at bedtime as needed 16)  Alprazolam 0.5 Mg Tabs (Alprazolam) .... 1/2 to 1 tab two times a day as needed 17)  Acyclovir 800 Mg Tabs (Acyclovir) .... Take 1 tablet by mouth two times a day 18)  Delsym 30 Mg/26ml Lqcr (Dextromethorphan polistirex) .... Per bottle 19)  Tessalon 200 Mg Caps (Benzonatate) .Marland Kitchen.. 1 by mouth three times a day as needed for cough 20)  Prednisone 10 Mg Tabs (Prednisone) .... 4 tabs for 2 days, then 3 tabs for 2 days, 2 tabs for 2 days, then 1 tab for 2 days, then stop 21)  Mycelex 10 Mg Troc (Clotrimazole) .Marland Kitchen.. 1 by mouth 5 x day for 7 days for thrush.  Patient Instructions: 1)  Prednisone taper over next week.  2)  Mucinex DM two times a day  3)  Tessalon 200mg  three times a day  4)  If still coughing may use hydrocodone 1 every 4-6 hr as needed for breakthrough coughing.  5)  Add Zantac 300mg  two times a day- 1 in am and 1 at bedtime  6)  Add Zyrtec 10mg  at bedtime  7)  Mycelex troches 5x day for thrush 8)  Brush/rinse and gargle after inhaler use.  9)  Try to use sugarless candy, ice chips,  water to help w/ cough, try to avoid coughing and throat clearing.  10)  NO MINTS  11)  follow up 1 week and as needed , we will check spirometry (lung function test on return) 12)  Increase fluids use tylenol as needed  13)  Please contact office for sooner follow up if symptoms do not improve or worsen  Prescriptions: MYCELEX 10 MG TROC (CLOTRIMAZOLE) 1 by mouth 5 x day for 7 days for thrush.  #35 x 0   Entered and Authorized by:   Rubye Oaks NP   Signed by:   Aylene Acoff NP on 04/03/2010   Method used:   Print then Give to Patient   RxID:   [id number] PREDNISONE 10 MG TABS (PREDNISONE) 4 tabs for 2 days, then 3 tabs for 2 days, 2 tabs for 2 days, then 1 tab for 2 days, then stop  #20 x 0   Entered and Authorized by:   Rubye Oaks NP   Signed by:   Arabelle Bollig NP on 04/03/2010   Method used:   Print then Give to Patient   RxID:   [id number] TESSALON 200 MG CAPS (BENZONATATE) 1 by mouth three times a day as needed for cough  #30 x 0   Entered and Authorized by:   Rubye Oaks NP   Signed by:   Socorro Kanitz NP on 04/03/2010   Method used:   Print then Give to Patient  RxID:   [id number]    Immunization History:  Influenza Immunization History:    Influenza:  historical (02/09/2010)  Pneumovax Immunization History:    Pneumovax:  historical (02/09/2010)    Medication Administration  Injection # 1:    Medication: Depo- Medrol 80mg     Diagnosis: ASTHMA, UNSPECIFIED (ICD-493.90)    Route: IM    Site: LUOQ gluteus    Exp Date: 09/11/2012    Lot #: [id number]    Mfr: [employer]    Patient tolerated injection without complications    Given by: Denna Haggard, CMA (Apr 03, 2010 10:31 AM)  Injection # 2:    Medication: Depo- Medrol 40mg     Diagnosis: ASTHMA, UNSPECIFIED (ICD-493.90)    Route: IM    Site: LUOQ gluteus    Exp Date: 09/11/2012    Lot #: [id number]    Mfr: [employer]    Patient tolerated injection without complications    Given by:  Denna Haggard, CMA (Apr 03, 2010 10:32 AM)  Orders Added: 1)  Depo- Medrol 40mg  [J1030] 2)  Depo- Medrol 80mg  [J1040] 3)  Admin of Therapeutic Inj  intramuscular or subcutaneous [96372] 4)  Est. Patient Level IV [30865] 5)  Prescription Created Electronically [phone]

## 2010-12-13 NOTE — Assessment & Plan Note (Signed)
Summary: Acute NP office visit - cough, asthma   Copy to:  Dr. Lubertha South Primary Provider/Referring Provider:  Dr. Lubertha South  CC:  cough no better, no producing clear/yelllow mucus, and still wheezing and having SOB.  states is worse than at last OV.Marland Kitchen  History of Present Illness: 70 yowm quit smoking in 1996 due to MI no resp symptoms at that point. 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  rx with levaquin now 5/10 and pred 5/10 did help fever but not cough.  on advair and albuterol (but did not initially disclose this) no better.  Apr 03, 2010--Presents for follow up and med review. Pt did not bring his meds with him today.  He was seen last visit for initial pulmoanry consult for cough since 3/11. Last visit  does c/o cough that occ produces yellow mucus, wheezing, increased SOB and occ x22months. Cough is mainly during daytime, does not bother him at night. Last visit changed from advair to dulera. and oil based vitamins stopped.  His cough is no better.   He has been taken off of Revlimid for now --by Oncology due to low blood count per pt (no records are available at this time). He has finshed Levaquin and steroid taper. Does feel some better while on abx and steroids but cough does not go entirely away but when finshed his cough and ntermittent fevers return.   Apr 10, 2010--new med calendar- pt brought all meds with him today.  still coughing though is mostly dry now, wheezing and having SOB - states slightly improved since last OV  April 19, 2010-Presents for acute office visit. Complains that cough no better, now  producing clear/yelllow mucus, still wheezing and having SOB.  states is worse than at last OV. PET 03/2010, w/ no  evidence of reoccurence of lymphoma. He is using mucinex dm and tessalon around the clock without stopping cough. Does have nasal drainage/drip despite zyrtec once daily. Mucus is mainly yellow early in am, then clears. Denies chest pain,  orthopnea, hemoptysis, fever, n/v/d, headache.   Medications Prior to Update: 1)  Dulera 100-5 Mcg/act Aero (Mometasone Furo-Formoterol Fum) .... 2 Puffs First Thing  in Am and 2 Puffs Again in Pm About 12 Hours Later 2)  Centrum Silver  Tabs (Multiple Vitamins-Minerals) .... Take 1 Tablet By Mouth Once A Day 3)  Caltrate 600+d Plus 600-400 Mg-Unit Tabs (Calcium Carbonate-Vit D-Min) .... Take 1 Tablet By Mouth Two Times A Day 4)  Vitamin B-6 Cr 200 Mg Cr-Tabs (Pyridoxine Hcl) .Marland Kitchen.. 1 Once Daily 5)  Klor-Con M20 20 Meq Cr-Tabs (Potassium Chloride Crys Cr) .Marland Kitchen.. 1 Two Times A Day 6)  Simvastatin 20 Mg Tabs (Simvastatin) .Marland Kitchen.. 1 Once Daily 7)  Plavix 75 Mg Tabs (Clopidogrel Bisulfate) .Marland Kitchen.. 1 Once Daily 8)  Amlodipine Besylate 10 Mg Tabs (Amlodipine Besylate) .Marland Kitchen.. 1 Once Daily 9)  Gabapentin 600 Mg Tabs (Gabapentin) .Marland Kitchen.. 1 Four Times A Day 10)  Acyclovir 800 Mg Tabs (Acyclovir) .... Take 1 Tablet By Mouth Two Times A Day 11)  Aspirin 81 Mg Tbec (Aspirin) .... Take 1 Tablet By Mouth Once A Day 12)  Ferrous Sulfate 325 (65 Fe) Mg Tabs (Ferrous  Sulfate) .Marland Kitchen.. 1 Once Daily 13)  Citalopram Hydrobromide 40 Mg Tabs (Citalopram Hydrobromide) .Marland Kitchen.. 1 Once Daily 14)  Zantac 150 Mg Tabs (Ranitidine Hcl) .... Take 1 Tablet By Mouth Two Times A Day 15)  Spironolactone 50 Mg Tabs (Spironolactone) .Marland Kitchen.. 1 Once Daily 16)  Mucinex Dm 30-600 Mg Xr12h-Tab (Dextromethorphan-Guaifenesin) .... Take 2 Tablets Every 12 Hours As Needed 17)  Tessalon 200 Mg Caps (Benzonatate) .Marland Kitchen.. 1 By Mouth Every 8 Hours As Needed 18)  Zyrtec Allergy 10 Mg Tabs (Cetirizine Hcl) .... Take 1 Tab By Mouth At Bedtime As Needed 19)  Tylenol Extra Strength 500 Mg Tabs (Acetaminophen) .... Per Bottle 20)   Hydrocodone-Acetaminophen 5-325 Mg Tabs (Hydrocodone-Acetaminophen) .Marland Kitchen.. 1 Every 4 Hrs As Needed 21)  Alprazolam 0.5 Mg Tabs (Alprazolam) .... 1/2 To 1 Tab Two Times A Day As Needed 22)  Zolpidem Tartrate 10 Mg Tabs (Zolpidem Tartrate) .Marland Kitchen.. 1 At Bedtime As Needed  Current Medications (verified): 1)  Dulera 100-5 Mcg/act Aero (Mometasone Furo-Formoterol Fum) .... 2 Puffs First Thing  in Am and 2 Puffs Again in Pm About 12 Hours Later 2)  Centrum Silver  Tabs (Multiple Vitamins-Minerals) .... Take 1 Tablet By Mouth Once A Day 3)  Caltrate 600+d Plus 600-400 Mg-Unit Tabs (Calcium Carbonate-Vit D-Min) .... Take 1 Tablet By Mouth Two Times A Day 4)  Vitamin B-6 Cr 200 Mg Cr-Tabs (Pyridoxine Hcl) .Marland Kitchen.. 1 Once Daily 5)  Klor-Con M20 20 Meq Cr-Tabs (Potassium Chloride Crys Cr) .Marland Kitchen.. 1 Two Times A Day 6)  Simvastatin 20 Mg Tabs (Simvastatin) .Marland Kitchen.. 1 Once Daily 7)  Plavix 75 Mg Tabs (Clopidogrel Bisulfate) .Marland Kitchen.. 1 Once Daily 8)  Amlodipine Besylate 10 Mg Tabs (Amlodipine Besylate) .Marland Kitchen.. 1 Once Daily 9)  Gabapentin 600 Mg Tabs (Gabapentin) .Marland Kitchen.. 1 Tablet By Mouth Morning, Noon and Evening and 2 By Mouth At Bedtime 10)  Acyclovir 800 Mg Tabs (Acyclovir) .... Take 1 Tablet By Mouth Two Times A Day 11)  Aspirin 81 Mg Tbec (Aspirin) .... Take 1 Tablet By Mouth Once A Day 12)  Ferrous Sulfate 325 (65 Fe) Mg Tabs (Ferrous Sulfate) .Marland Kitchen.. 1 Once Daily 13)  Citalopram Hydrobromide 40 Mg Tabs (Citalopram Hydrobromide) .Marland Kitchen.. 1 Once Daily 14)  Zantac 150 Mg Tabs (Ranitidine Hcl) .... Take 1 Tablet By Mouth Two Times A Day 15)  Spironolactone 50 Mg Tabs (Spironolactone) .Marland Kitchen.. 1 Once Daily 16)  Mucinex Dm 30-600 Mg Xr12h-Tab (Dextromethorphan-Guaifenesin) .... Take 2 Tablets Every 12 Hours As Needed 17)  Tessalon 200 Mg Caps (Benzonatate) .Marland Kitchen.. 1 By Mouth Every 8 Hours As Needed 18)  Zyrtec Allergy 10 Mg Tabs (Cetirizine Hcl) .... Take 1 Tab By Mouth At Bedtime As Needed 19)  Tylenol Extra Strength 500 Mg Tabs (Acetaminophen)  .... Per Bottle 20)  Hydrocodone-Acetaminophen 5-325 Mg Tabs (Hydrocodone-Acetaminophen) .Marland Kitchen.. 1 Every 4 Hrs As Needed 21)  Alprazolam 0.5 Mg Tabs (Alprazolam) .... 1/2 To 1 Tab Two Times A Day As Needed 22)  Zolpidem Tartrate 10 Mg Tabs (Zolpidem Tartrate) .Marland Kitchen.. 1 At Bedtime As Needed  Allergies (verified): 1)  ! 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  Quit in 1996.  Smoked off and on x 30 yrs approx up to 1 1/2 ppd No ETOH Retired [employer] architect  Review of Systems      See HPI  Vital Signs:  Patient profile:   70 year old male Height:      67 inches Weight:      184 pounds BMI:     28.92 O2 Sat:      99 % on Room air Temp:     98.2 degrees F oral Pulse rate:   68 / minute BP sitting:   114 / 72  (left arm) Cuff size:   regular  Vitals Entered By: Boone Master CNA/MA (April 19, 2010 11:38 AM)  O2 Flow:  Room air CC: cough no better, no producing clear/yelllow mucus, still wheezing and having SOB.  states is worse than at last OV. Is Patient Diabetic? No Comments Medications reviewed with patient Daytime contact number verified with patient. Boone Master CNA/MA  April 19, 2010 11:38 AM    Physical  Exam  Additional Exam:  amb wm nad with classic pseudowheeze resolves with purse lip maneuver  wt 183 Mar 22, 2010 >>183 April 19, 2010  HEENT mild turbinate edema.  Oropharynx -post pharynx w/ few scattered white patches. No JVD or cervical adenopathy. Mild accessory muscle hypertrophy. Trachea midline, nl thryroid. Chest was hyperinflated by percussion with diminished breath sounds and moderate increased exp time with upper airway insp > exp wheeze. , scattered rhonchi., psuedowheezing on forced exp. Regular rate and rhythm without murmur gallop or rub or increase P2 or edema.  Abd: no hsm, nl excursion. Ext warm without cyanosis or clubbing.     Impression & Recommendations:  Problem # 1:  ASTHMA, UNSPECIFIED (ICD-493.90)  Slow to resolve Asthmatic bronchitic flare w/ upper airway cough syndrome.  pt is somewheat complicated w/ underlying hx of multiple myleoma s/p bone  marrow transplant recently on Revlimid. 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 Pet scan w/ no evidence of reoccurrence.  BC neg. sputum cx few weeks ago showed few candidia and strep pnuem -tx w/ mycelex and levaquin. no more fevers since finishing this.  Hx of smoking-quit in 1996 No known hx of underlying COPD  no desaturations w/ ambuliation .  will aim tx at GERD/rhinitis/cough prevention , can not use PPI d/t on plavix. al will change zyrtec to allegra, may add chlor tab at at bedtime.  also slow predniosne taper. may also use hydrocodone for breakthrough cough.  May need CT sinus in future-will hold for now, he has had alot of scans and finished abx courses that would cover sinusitis REC:  Prednisone taper over next week.  Change zyrtec to Allegra 180mg  once daily  May add chlor tabs 4mg  2 tabs at bedtime as needed drainage/throat clearing.  May add hydrocodone to cough prevention regimen.   Brush/rinse and gargle after inhaler use.  Try to use sugarless candy, ice chips, water to help w/ cough, try to  avoid coughing and throat clearing.  NO MINTS  Follow med calendar closely and bring back to each visit.  follow up Dr. Sherene Sires in 1 week  and as needed Please contact  office for sooner follow up if symptoms do not improve or worsen  will check bnp and esr     Orders: TLB-BNP (B-Natriuretic Peptide) (83880-BNPR) TLB-Sedimentation Rate (ESR) (85652-ESR) Est. Patient Level IV (16109)  Medications Added to Medication List This Visit: 1)  Gabapentin 600 Mg Tabs (Gabapentin) .Marland Kitchen.. 1 tablet by mouth morning, noon and evening and 2 by mouth at bedtime 2)  Prednisone 10 Mg Tabs (Prednisone) .... 4 tabs for 3 days, then 3 tabs for 3 days, 2 tabs for 3 days, then 1 tab for 3 days, then stop  Complete Medication List: 1)  Dulera 100-5 Mcg/act Aero (Mometasone furo-formoterol fum) .... 2 puffs first thing  in am and 2 puffs again in pm about 12 hours later 2)  Centrum Silver Tabs (Multiple vitamins-minerals) .... Take 1 tablet by mouth once a day 3)  Caltrate 600+d Plus 600-400 Mg-unit Tabs (Calcium carbonate-vit d-min) .... Take 1 tablet by mouth two times a day 4)  Vitamin B-6 Cr 200 Mg Cr-tabs (Pyridoxine hcl) .Marland Kitchen.. 1 once daily 5)  Klor-con M20 20 Meq Cr-tabs (Potassium chloride crys cr) .Marland Kitchen.. 1 two times a day 6)  Simvastatin 20 Mg Tabs (Simvastatin) .Marland Kitchen.. 1 once daily 7)  Plavix 75 Mg Tabs (Clopidogrel bisulfate) .Marland Kitchen.. 1 once daily 8)  Amlodipine Besylate 10 Mg Tabs (Amlodipine besylate) .Marland Kitchen.. 1 once daily 9)  Gabapentin 600 Mg Tabs (Gabapentin) .Marland Kitchen.. 1 tablet by mouth morning, noon and evening and 2 by mouth at bedtime 10)  Acyclovir 800 Mg Tabs (Acyclovir) .... Take 1 tablet by mouth two times a day 11)  Aspirin 81 Mg Tbec (Aspirin) .... Take 1 tablet by mouth once a day 12)  Ferrous Sulfate 325 (65 Fe) Mg Tabs (Ferrous sulfate) .Marland Kitchen.. 1 once daily 13)  Citalopram Hydrobromide 40 Mg Tabs (Citalopram hydrobromide) .Marland Kitchen.. 1 once daily 14)  Zantac 150 Mg Tabs (Ranitidine hcl) .... Take 1 tablet by mouth  two times a day 15)  Spironolactone 50 Mg Tabs (Spironolactone) .Marland Kitchen.. 1 once daily 16)  Mucinex Dm 30-600 Mg Xr12h-tab (Dextromethorphan-guaifenesin) .... Take 2 tablets every 12 hours as needed 17)  Tessalon 200 Mg Caps (Benzonatate) .Marland Kitchen.. 1 by mouth every 8 hours as needed 18)  Zyrtec Allergy 10 Mg Tabs (Cetirizine hcl) .... Take 1 tab by mouth at bedtime as needed 19)  Tylenol Extra Strength 500 Mg Tabs (Acetaminophen) .... Per bottle 20)  Hydrocodone-acetaminophen 5-325 Mg Tabs (Hydrocodone-acetaminophen) .Marland Kitchen.. 1 every 4 hrs as needed 21)  Alprazolam 0.5 Mg Tabs (Alprazolam) .... 1/2 to 1 tab two times a day as needed 22)  Zolpidem Tartrate 10 Mg Tabs (Zolpidem tartrate) .Marland Kitchen.. 1 at bedtime as needed 23)  Prednisone 10 Mg Tabs (Prednisone) .... 4 tabs for 3 days, then 3 tabs for 3 days, 2 tabs for 3 days, then 1 tab for 3 days, then stop  Patient Instructions: 1)  Prednisone taper over next week.  2)  Change zyrtec to Allegra 180mg  once daily  3)  May add chlor tabs 4mg  2 tabs at bedtime as needed drainage/throat clearing.  4)  May add hydrocodone to cough prevention regimen.  5)   Brush/rinse and gargle after inhaler use.  6)  Try to use sugarless candy, ice chips, water to help w/ cough, try to avoid coughing and throat clearing.  7)  NO MINTS  8)  Follow med calendar closely and bring back to each visit.  9)  follow up Dr. Sherene Sires in 1 week  and as needed 10)  Please contact office for sooner follow up if symptoms do not improve or worsen  Prescriptions: HYDROCODONE-ACETAMINOPHEN 5-325 MG TABS (HYDROCODONE-ACETAMINOPHEN) 1 every 4 hrs as needed  #45 x 0   Entered and Authorized by:   Rubye Oaks NP   Signed by:   Love Chowning NP on 04/19/2010   Method used:   Print then Give to Patient   RxID:   [id number] PREDNISONE 10 MG TABS (PREDNISONE) 4 tabs for 3 days, then 3 tabs for 3 days, 2 tabs for 3 days, then 1 tab for 3 days, then stop  #30 x 0   Entered and Authorized by:   Rubye Oaks NP   Signed by:   Paizleigh Wilds NP on 04/19/2010   Method used:   Electronically to        [facility] [phone]* (retail)       [address]       [place], [place]  [address]       Ph: [phone] or [phone]       Fax: [phone]   RxID:   [id number]   Appended Document: Acute NP office visit - cough, asthma     Medication Administration  Medication # 1:    Medication: Xopenex 1.25mg     Diagnosis: ASTHMA, UNSPECIFIED (ICD-493.90)    Dose: 1    Route: po    Exp Date: 04/19/2010    Lot #: [id number]    Mfr: [email or web address]    Patient tolerated medication without complications    Given by: Denna Haggard, CMA (April 19, 2010 12:25 PM)  Orders Added: 1)  Xopenex 1.25mg  [Y6063]   Appended Document: Acute NP office visit - cough, asthma Patient xopenex exp date was recorded as 04/19/10...Marland KitchenMarland Kitchenthe actual exp date should read September 2011  Appended Document: med calendar update    Clinical Lists Changes  Medications: Added new medication of ALLEGRA 180 MG TABS (FEXOFENADINE HCL) Take 1 tablet by mouth once a day Changed medication from TESSALON 200 MG CAPS (BENZONATATE) 1 by mouth every 8 hours as needed to BENZONATATE 100 MG CAPS (BENZONATATE) 2 every 8 hours as needed Changed medication from ZYRTEC ALLERGY 10 MG TABS (CETIRIZINE HCL) Take 1 tab by mouth at bedtime as needed to CHLOR-TRIMETON 4 MG TABS (CHLORPHENIRAMINE MALEATE) 2 tabs by mouth at as needed Removed medication of PREDNISONE 10 MG TABS (PREDNISONE) 4 tabs for 3 days, then 3 tabs for 3 days, 2 tabs for 3 days, then 1 tab for 3 days, then stop

## 2010-12-13 NOTE — Assessment & Plan Note (Signed)
Summary: NP follow up - med calendar   Copy to:  Dr. Lubertha South Primary Provider/Referring Provider:  Dr. Lubertha South  CC:  new med calendar- pt brought all meds with him today.  still coughing though is mostly dry now and wheezing and having SOB - states slightly improved since last OV.  History of Present Illness: 70 yowm quit smoking in 1996 due to MI no resp symptoms at that point. 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  rx with levaquin now 5/10 and pred 5/10 did help fever but not cough.  on advair and albuterol (but did not initially disclose this) no better.  Apr 03, 2010--Presents for follow up and med review. Pt did not bring his meds with him today.  He was seen last visit for initial pulmoanry consult for cough since 3/11. Last visit  does c/o cough that occ produces yellow mucus, wheezing, increased SOB and occ x52months. Cough is mainly during daytime, does not bother him at night. Last visit changed from advair to dulera. and oil based vitamins stopped.  His cough is no better.   He has been taken off of Revlimid for now --by Oncology due to low blood count per pt (no records are available at this time). He has finshed Levaquin and steroid taper. Does feel some better while on abx and steroids but cough does not go entirely away but when finshed his cough and ntermittent fevers return.   Apr 10, 2010-- new med calendar- pt brought all meds with him today.  still coughing though is mostly dry now, wheezing and having SOB - states slightly improved since last OV  Medications Prior to Update: 1)  Dulera 100-5 Mcg/act Aero (Mometasone Furo-Formoterol Fum) .... 2 Puffs First Thing  in Am and 2 Puffs Again in Pm About 12 Hours  Later 2)  Caltrate 600+d Plus 600-400 Mg-Unit Tabs (Calcium Carbonate-Vit D-Min) .Marland Kitchen.. 1 Once Daily 3)  Vitamin B-6 Cr 200 Mg Cr-Tabs (Pyridoxine Hcl) .Marland Kitchen.. 1 Once Daily 4)  Klor-Con M20 20 Meq Cr-Tabs (Potassium Chloride Crys Cr) .Marland Kitchen.. 1 Two Times A Day 5)  Simvastatin 20 Mg Tabs (Simvastatin) .Marland Kitchen.. 1 Once Daily 6)  Plavix 75 Mg Tabs (Clopidogrel Bisulfate) .Marland Kitchen.. 1 Once Daily 7)  Amlodipine Besylate 10 Mg Tabs (Amlodipine Besylate) .Marland Kitchen.. 1 Once Daily 8)  Gabapentin 600 Mg Tabs (Gabapentin) .Marland Kitchen.. 1 Four Times A Day 9)  Acyclovir 800 Mg Tabs (Acyclovir) .... Take 1 Tablet By Mouth Two Times A Day 10)  Bayer Aspirin 325 Mg Tabs (Aspirin) .Marland Kitchen.. 1 Once Daily 11)  Ferrous Sulfate 325 (65 Fe) Mg Tabs (Ferrous Sulfate) .Marland Kitchen.. 1 Once Daily 12)  Citalopram Hydrobromide 40 Mg Tabs (Citalopram Hydrobromide) .Marland Kitchen.. 1 Once Daily 13)  Spironolactone 50 Mg Tabs (Spironolactone) .Marland Kitchen.. 1 Once Daily As Needed 14)  Tessalon 200 Mg Caps (Benzonatate) .Marland Kitchen.. 1 By Mouth Three Times A Day As Needed For Cough 15)  Hydrocodone-Acetaminophen 5-325 Mg Tabs (Hydrocodone-Acetaminophen) .Marland Kitchen.. 1 Every 4-6 Hrs As Needed 16)  Alprazolam 0.5 Mg Tabs (Alprazolam) .... 1/2 To 1 Tab Two Times A Day As Needed 17)  Zolpidem Tartrate 10 Mg Tabs (Zolpidem Tartrate) .Marland Kitchen.. 1 At Bedtime As Needed 18)  Revlimid 10 Mg Caps (Lenalidomide) .Marland Kitchen.. 1 Once Daily 19)  Delsym 30 Mg/31ml Lqcr (Dextromethorphan Polistirex) .... Per Bottle 20)  Prednisone 10 Mg Tabs (Prednisone) .... 4 Tabs For 2 Days, Then 3 Tabs For 2 Days, 2 Tabs For 2 Days, Then 1 Tab For 2 Days, Then Stop 21)  Mycelex 10 Mg Troc (Clotrimazole) .Marland Kitchen.. 1 By Mouth 5 X Day For 7 Days For Thrush.  Current Medications (verified): 1)  Dulera 100-5 Mcg/act Aero (Mometasone Furo-Formoterol Fum) .... 2 Puffs First Thing  in Am and 2 Puffs Again in Pm About 12 Hours Later 2)  Centrum Silver  Tabs (Multiple Vitamins-Minerals) .... Take 1 Tablet By Mouth Once A Day 3)  Caltrate 600+d Plus 600-400 Mg-Unit Tabs  (Calcium Carbonate-Vit D-Min) .... Take 1 Tablet By Mouth Two Times A Day 4)  Vitamin B-6 Cr 200 Mg Cr-Tabs (Pyridoxine Hcl) .Marland Kitchen.. 1 Once Daily 5)  Klor-Con M20 20 Meq Cr-Tabs (Potassium Chloride Crys Cr) .Marland Kitchen.. 1 Two Times A Day 6)  Simvastatin 20 Mg Tabs (Simvastatin) .Marland Kitchen.. 1 Once Daily 7)  Plavix 75 Mg Tabs (Clopidogrel Bisulfate) .Marland Kitchen.. 1 Once Daily 8)  Amlodipine Besylate 10 Mg Tabs (Amlodipine Besylate) .Marland Kitchen.. 1 Once Daily 9)  Gabapentin 600 Mg Tabs (Gabapentin) .Marland Kitchen.. 1 Four Times A Day 10)  Acyclovir 800 Mg Tabs (Acyclovir) .... Take 1 Tablet By Mouth Two Times A Day 11)  Aspirin 81 Mg Tbec (Aspirin) .... Take 1 Tablet By Mouth Once A Day 12)  Ferrous Sulfate 325 (65 Fe) Mg Tabs (Ferrous Sulfate) .Marland Kitchen.. 1 Once Daily 13)  Citalopram Hydrobromide 40 Mg Tabs (Citalopram Hydrobromide) .Marland Kitchen.. 1 Once Daily 14)  Zantac 150 Mg Tabs (Ranitidine Hcl) .... Take 1 Tablet By Mouth Two Times A Day 15)  Spironolactone 50 Mg Tabs (Spironolactone) .Marland Kitchen.. 1 Once Daily 16)  Mucinex Dm 30-600 Mg Xr12h-Tab (Dextromethorphan-Guaifenesin) .... Take 2 Tablets Every 12 Hours As Needed 17)  Tessalon 200 Mg Caps (Benzonatate) .Marland Kitchen.. 1 By Mouth Every 8 Hours As Needed 18)  Zyrtec Allergy 10 Mg Tabs (Cetirizine Hcl) .... Take 1 Tab By Mouth At Bedtime As Needed 19)  Tylenol Extra Strength 500 Mg Tabs (Acetaminophen) .... Per Bottle 20)  Hydrocodone-Acetaminophen 5-325 Mg Tabs (Hydrocodone-Acetaminophen) .Marland Kitchen.. 1 Every 4 Hrs As Needed 21)  Alprazolam 0.5 Mg Tabs (Alprazolam) .... 1/2 To 1 Tab Two Times A Day As Needed 22)  Zolpidem Tartrate 10 Mg Tabs (Zolpidem Tartrate) .Marland Kitchen.. 1 At Bedtime As Needed  Allergies (verified): 1)  ! Benadryl  Past History:  Family History: Last updated: 03/22/2010 Heart dz- Father Negative for respiratory diseases or atopy   Social History: Last updated: 03/22/2010 Married Children Former smoker.  Quit in 1996.  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  still coughing though is mostly dry now, wheezing and having SOB -  states slightly improved since last OV Is Patient Diabetic? No Comments Medications reviewed with patient Daytime contact number verified with patient. Boone Master CNA/MA  Apr 10, 2010 2:06 PM     Pulmonary Function Test Date: 04/10/2010 2:12 PM Gender: Male  Pre-Spirometry FVC    Value: 2.88 L/min   % Pred: 71.50 % FEV1    Value: 2.03 L     Pred: 2.98 L     % Pred: 67.90 % FEV1/FVC  Value: 70.33 %     % Pred: 94.80 %  Impression & Recommendations:  Problem # 1:  CANDIDIASIS, ORAL (ICD-112.0)  Medications Added to Medication List This Visit: 1)  Centrum Silver Tabs (Multiple vitamins-minerals) .... Take 1 tablet by mouth once a day 2)  Caltrate 600+d Plus 600-400 Mg-unit Tabs (Calcium carbonate-vit d-min) .... Take 1 tablet by mouth two times a day 3)  Aspirin 81 Mg Tbec (Aspirin) .... Take 1 tablet by mouth once a day 4)  Zantac 150 Mg Tabs (Ranitidine hcl) .... Take 1 tablet by mouth two times a day 5)  Spironolactone 50 Mg Tabs (Spironolactone) .Marland Kitchen.. 1 once daily 6)  Mucinex Dm 30-600 Mg Xr12h-tab (Dextromethorphan-guaifenesin) .... Take 2 tablets every 12 hours as needed 7)  Tessalon 200 Mg Caps (Benzonatate) .Marland Kitchen.. 1 by mouth every 8 hours as needed 8)  Zyrtec Allergy 10 Mg Tabs (Cetirizine hcl) .... Take 1 tab by mouth at bedtime as needed 9)  Tylenol Extra Strength 500 Mg Tabs (Acetaminophen) .... Per bottle 10)  Hydrocodone-acetaminophen 5-325 Mg Tabs (Hydrocodone-acetaminophen) .Marland Kitchen.. 1 every 4 hrs  as needed  Other Orders: Est. Patient Level III (16109)  Patient Instructions: 1)  Finish mycelex.   2)  Brush/rinse and gargle after inhaler use.  3)  Try to use sugarless candy, ice chips, water to help w/ cough, try to avoid coughing and throat clearing.  4)  NO MINTS  5)  Follow med calendar closely and bring back to each visit.  6)  follow up Dr. 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

## 2010-12-13 NOTE — Letter (Signed)
Summary: [facility]   Imported By: Curtis Sites 04/26/2010 11:15:17  _____________________________________________________________________  External Attachment:    Type:   Image     Comment:   External Document

## 2010-12-13 NOTE — Progress Notes (Signed)
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  LMOMTCB  Initial call taken by: Vernie Murders,  May 23, 2010 12:45 PM  Follow-up for Phone Call        d/w pt at appt today Follow-up by: Vernie Murders,  May 24, 2010 11:20 AM

## 2010-12-13 NOTE — Letter (Signed)
Summary: [facility]  [facility]   Imported By: Sherian Rein 09/04/2010 09:55:28  _____________________________________________________________________  External Attachment:    Type:   Image     Comment:   External Document

## 2010-12-13 NOTE — Assessment & Plan Note (Signed)
Summary: Pulmonary/ ext ov - try off dulera, max gerd rx   Copy to:  Dr. Lubertha South Primary Provider/Referring Provider:  Dr. Lubertha South  CC:  1 wk followup.  Pt states that he is doing about the same since last ov.  He is still wheezing and coughing up yellow sputum.  States that symptoms are no worse.  Jeremy Parrish  History of Present Illness: 70 yowm quit smoking in 1996 due to MI no resp symptoms at that point. 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  rx with levaquin now 5/10 and pred 5/10 did help fever but not cough.  on advair and albuterol (but did not initially disclose this) no better.  Apr 03, 2010--Presents for follow up and med review. Pt did not bring his meds with him today.  He was seen last visit for initial pulmoanry consult for cough since 3/11. Last visit  does c/o cough that occ produces yellow mucus, wheezing, increased SOB and occ x16months. Cough is mainly during daytime, does not bother him at night. Last visit changed from advair to dulera. and oil based vitamins stopped.  His cough is no better.   He has been taken off of Revlimid for now --by Oncology due to low blood count per pt (no records are available at this time). He has finshed Levaquin and steroid taper. Does feel some better while on abx and steroids but cough does not go entirely away but when finshed his cough and ntermittent fevers return.   Apr 10, 2010--new med calendar- pt brought all meds with him today.  still coughing though is mostly dry now, wheezing and having SOB - states slightly improved since last OV  April 19, 2010-Presents for acute office visit. Complains that cough no better, now  producing clear/yelllow mucus, still wheezing and having SOB.   states is worse than at last OV. PET 03/2010, w/ no evidence of reoccurence of lymphoma. 4 wk followup with PFT's.    rec prednisone, allegra and chlortrimeton  April 26, 2010 ov cc same since last ov.  He is still wheezing and coughing up yellow sputum.  States that symptoms are no worse, but no better on max rx for AB.  Pt denies any significant sore throat, dysphagia, itching, sneezing  Current Medications (verified): 1)  Dulera 100-5 Mcg/act Aero (Mometasone Furo-Formoterol Fum) .... 2 Puffs First Thing  in Am and 2 Puffs Again in Pm About 12 Hours Later 2)  Centrum Silver  Tabs (Multiple Vitamins-Minerals) .... Take 1 Tablet By Mouth Once A Day 3)  Caltrate 600+d Plus 600-400 Mg-Unit Tabs (Calcium Carbonate-Vit D-Min) .... Take 1 Tablet By Mouth Two Times A Day 4)  Vitamin B-6 Cr 200 Mg Cr-Tabs (Pyridoxine Hcl) .Jeremy Parrish.. 1 Once Daily 5)  Klor-Con M20 20 Meq Cr-Tabs (Potassium Chloride Crys Cr) .Jeremy Parrish.. 1 Two Times A Day 6)  Simvastatin 20 Mg Tabs (Simvastatin) .Jeremy Parrish.. 1 Once Daily 7)  Plavix 75 Mg Tabs (Clopidogrel Bisulfate) .Jeremy Parrish.. 1 Once Daily 8)  Amlodipine Besylate 10 Mg Tabs (Amlodipine Besylate) .Jeremy Parrish.. 1 Once Daily 9)  Gabapentin 600 Mg Tabs (Gabapentin) .Jeremy Parrish.. 1 Tablet By Mouth Morning, Noon and Evening and 2 By Mouth At Bedtime  10)  Acyclovir 800 Mg Tabs (Acyclovir) .... Take 1 Tablet By Mouth Two Times A Day 11)  Aspirin 81 Mg Tbec (Aspirin) .... Take 1 Tablet By Mouth Once A Day 12)  Ferrous Sulfate 325 (65 Fe) Mg Tabs (Ferrous Sulfate) .Jeremy Parrish.. 1 Once Daily 13)  Citalopram Hydrobromide 40 Mg Tabs (Citalopram Hydrobromide) .Jeremy Parrish.. 1 Once Daily 14)  Zantac 150 Mg Tabs (Ranitidine Hcl) .... Take 1 Tablet By Mouth Two Times A Day 15)  Spironolactone 50 Mg Tabs (Spironolactone) .Jeremy Parrish.. 1 Once Daily 16)  Allegra 180 Mg Tabs (Fexofenadine Hcl) .... Take 1 Tablet By Mouth Once A Day 17)  Mucinex Dm 30-600 Mg Xr12h-Tab (Dextromethorphan-Guaifenesin) .... Take 2 Tablets Every 12 Hours As Needed 18)  Benzonatate 100 Mg  Caps (Benzonatate) .... 2 Every 8 Hours As Needed 19)  Chlor-Trimeton 4 Mg Tabs (Chlorpheniramine Maleate) .... 2 Tabs By Mouth At As Needed 20)  Tylenol Extra Strength 500 Mg Tabs (Acetaminophen) .... Per Bottle 21)  Hydrocodone-Acetaminophen 5-325 Mg Tabs (Hydrocodone-Acetaminophen) .Jeremy Parrish.. 1 Every 4 Hrs As Needed 22)  Alprazolam 0.5 Mg Tabs (Alprazolam) .... 1/2 To 1 Tab Two Times A Day As Needed 23)  Zolpidem Tartrate 10 Mg Tabs (Zolpidem Tartrate) .Jeremy Parrish.. 1 At Bedtime As Needed  Allergies (verified): 1)  ! 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 MULTIPLE  MYELOMA (ICD-203.00) 2009 s/p bone marrow transplant autologous [facility] Oct 2010 New rx March 11914 = Revlimid  Dr Ignacia Bayley  HYPERTENSION (ICD-401.9) ANXIETY (ICD-300.00) CAD (ICD-414.00) Asthma/ Chronic cough  onset  01/2010     - HFA 25-75% Mar 22, 2010 with coaching     - Sinus CT April 26, 2010 mild thickening only, no AF levels  Vital Signs:  Patient profile:   70 year old male Weight:      189 pounds O2 Sat:      95 % on Room air Temp:     97.9 degrees F oral Pulse rate:   71 / minute BP sitting:   104 / 62  (left arm)  Vitals Entered By: Vernie Murders (April 26, 2010 10:40 AM)  O2 Flow:  Room air  Physical Exam  Additional Exam:  amb wm nad with classic pseudowheeze resolves with purse lip maneuver  wt 183 Mar 22, 2010 >>183 April 19, 2010 >> 189 April 26, 2010  HEENT mild turbinate edema.  Oropharynx -post pharynx w/ few scattered white patches. No JVD or cervical adenopathy. Mild accessory muscle hypertrophy. Trachea midline, nl thryroid. Chest was hyperinflated by percussion with diminished breath sounds and moderate increased exp time with upper airway insp > exp wheeze. , scattered rhonchi., psuedowheezing on forced exp. Regular rate and rhythm without murmur gallop or rub or increase P2 or edema.  Abd:  no hsm, nl excursion. Ext warm without cyanosis or clubbing.     CXR  Procedure date:  04/26/2010  Findings:      No acute chest disease.  No interval change.  Impression & Recommendations:  Problem # 1:  COUGH (ICD-786.2) The most common causes of chronic cough in immunocompetent adults include: upper airway cough syndrome (UACS), previously referred to as postnasal drip syndrome,  caused by variety of rhinosinus conditions; (2) asthma; (3) GERD; (4) chronic bronchitis from cigarette smoking or other inhaled environmental irritants; (5) nonasthmatic eosinophilic bronchitis; and (6) bronchiectasis. These conditions, singly or in combination, have accounted for up to 94%  of the causes of chronic cough in prospective studies.   Max rx for asthma with dulera and prednisone no better so try off all asthma rx  ? sinusitis though sinus ct very unimpressive, since am sputum esp purulent try 10 days augmentin and f/u in 2 weeks  ? Acid reflux See instructions for specific recommendations   The standardized cough guidelines recently published in Chest are a 14 step process, not a single office visit,  and are intended  to address this problem logically,  with an alogrithm dependent on response to each progressive step  to determine a specific diagnosis with  minimal addtional testing needed. Therefore if compliance is an issue this empiric standardized approach simply won't work. Eye doctor analogy reviwed, seems to be doing better on med reconciliation concepts     Problem # 2:  ABNORMAL LUNG XRAY (ICD-793.1) cxr nl, no evidence of recurrent dz  Medications Added to Medication List This Visit: 1)  Zantac 150 Mg Tabs (Ranitidine hcl) .... 2 at bedtime 2)  Chlor-trimeton 4 Mg Tabs (Chlorpheniramine maleate) .Jeremy Parrish.. 1 four times a day 3)  Dexilant 60 Mg Cpdr (Dexlansoprazole) .... Take  one 30-60 min before first meal of the day 4)  Hydrocodone-acetaminophen 5-325 Mg Tabs (Hydrocodone-acetaminophen)  .Jeremy Parrish.. 1-2  every 4 hrs as needed for cough 5)  Augmentin 875-125 Mg Tabs (Amoxicillin-pot clavulanate) .... One twice daily with glass of water 6)  Flutter Valve  .... Use as directed  Other Orders: Misc. Referral (Misc. Ref) T-2 View CXR (71020TC) Prescription Created Electronically [phone]) Est. Patient Level IV (60454)  Patient Instructions: 1)  Start Dexilant 60 Take  one 30-60 min before first meal of the day and change zantac to 2 at bedtime 2)  augmentin 875 twice daily with meals x 10 days 3)  Change clortrimeton to 4 mg four times daily 4)  Stop dulera, delsym, tessilon 5)  Take delsym two tsp every 12 hours and add hyrodocone  up to 2every 4 hours to suppress the urge to cough. Swallowing water or using ice chips/non mint and menthol containing candies (such as lifesavers or sugarless jolly ranchers) are also effective.  6)  See Patient Care Coordinator before leaving for sinus ct 7)  Please schedule a follow-up appointment in 2 weeks, sooner if needed  8)  FLUTTER VALVE TRAINING Prescriptions: FLUTTER VALVE use as directed  #1 x 0   Entered by:   Vernie Murders   Authorized by:   Nyoka Cowden MD   Signed by:   Vernie Murders on 04/26/2010   Method used:   Print then Give to Patient   RxID:   [id number] AUGMENTIN 875-125 MG  TABS (AMOXICILLIN-POT CLAVULANATE) one twice daily with glass of water  #20 x 0   Entered and Authorized by:   Nyoka Cowden MD   Signed by:   Nyoka Cowden MD on 04/26/2010   Method used:   Electronically to        [facility] [address] [phone]* (retail)       [address]       [place], [place]  [address]       Ph: [phone] or [phone]       Fax: [phone]   RxID:   [id number]

## 2010-12-13 NOTE — Letter (Signed)
Summary: [facility]  [facility]   Imported By: Minna Merritts 05/31/2010 16:19:23  _____________________________________________________________________  External Attachment:    Type:   Image     Comment:   External Document

## 2010-12-13 NOTE — Assessment & Plan Note (Signed)
Summary: Pulmonary/ ext summary final f/u ov   Copy to:  Dr. Lubertha South Primary Provider/Referring Provider:  Dr. Lubertha South  CC:  2 wk followup.  Pt still c/o cough- prod with clear sputum.  Finished cipro on 05/23/10.  Marland Kitchen  History of Present Illness: 70 yowm quit smoking in 1996 due to MI no resp symptoms at that point. 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  rx with levaquin now 5/10 and pred 5/10 did help fever but not cough.  on advair and albuterol (but did not initially disclose this) no better.  Apr 03, 2010--Presents for follow up and med review. Pt did not bring his meds with him today.  He was seen last visit for initial pulmoanry consult for cough since 3/11. Last visit  does c/o cough that occ produces yellow mucus, wheezing, increased SOB and occ x57months. Cough is mainly during daytime, does not bother him at night. Last visit changed from advair to dulera. and oil based vitamins stopped.  His cough is no better.   He has been taken off of Revlimid for now --by Oncology due to low blood count per pt (no records are available at this time). He has finshed Levaquin and steroid taper. Does feel some better while on abx and steroids but cough does not go entirely away but when finshed his cough and ntermittent fevers return.   Apr 10, 2010--new med calendar- pt brought all meds with him today.  still coughing though is mostly dry now, wheezing and having SOB - states slightly improved since last OV  April 19, 2010-Presents for acute office visit. Complains that cough no better, now  producing clear/yelllow mucus, still wheezing and having SOB.  states is worse than at last OV. PET 03/2010,  w/ no evidence of reoccurence of lymphoma. 4 wk followup with PFT's.    rec prednisone, allegra and chlortrimeton  April 26, 2010 ov cc same since last ov.  He is still wheezing and coughing up yellow sputum.  States that symptoms are no worse, but no better on max rx for AB.  rec trial off dulera  page 2 May 10, 2010 ov says even augmentin did not affect the color the mucus and spends up to an hour and using hc every 4 hours during the day but no noct flare but noisy breathing.  cipro has worked the best on changing mucus from yellow to white.  rec Use delsym first then add Hydrocodone if needed Use flutter valve as much as possible Cipro 750 twice daily x 10 days then return in 2 weeks for ov Prednisone 10 mg  4 each am x 2days, 2x2days, 1x2days and stop  May 24, 2010 2 wk followup.  Pt still c/o cough- prod with clear sputum.  Finished cipro on 05/23/10 some better. no worse off dulera or after stopped prednisone.  Pt denies any significant sore throat, dysphagia, itching, sneezing,  nasal congestion or excess secretions,  fever, chills, sweats, unintended wt loss, pleuritic or exertional cp, hempoptysis, change in activity tolerance  orthopnea pnd or leg swelling   Current Medications (verified): 1)  Centrum Silver  Tabs (Multiple Vitamins-Minerals) .Marland KitchenMarland KitchenMarland Kitchen  Take 1 Tablet By Mouth Once A Day 2)  Caltrate 600+d Plus 600-400 Mg-Unit Tabs (Calcium Carbonate-Vit D-Min) .... Take 1 Tablet By Mouth Two Times A Day 3)  Vitamin B-6 Cr 200 Mg Cr-Tabs (Pyridoxine Hcl) .Marland Kitchen.. 1 Once Daily 4)  Klor-Con M20 20 Meq Cr-Tabs (Potassium Chloride Crys Cr) .Marland Kitchen.. 1 Two Times A Day 5)  Simvastatin 20 Mg Tabs (Simvastatin) .Marland Kitchen.. 1 Once Daily 6)  Plavix 75 Mg Tabs (Clopidogrel Bisulfate) .Marland Kitchen.. 1 Once Daily 7)  Amlodipine Besylate 10 Mg Tabs (Amlodipine Besylate) .Marland Kitchen.. 1 Once Daily 8)  Gabapentin 600 Mg Tabs (Gabapentin) .Marland Kitchen.. 1 Tablet By Mouth Morning, Noon and Evening and 2 By Mouth At Bedtime 9)  Acyclovir 800 Mg Tabs  (Acyclovir) .... Take 1 Tablet By Mouth Two Times A Day 10)  Aspirin 81 Mg Tbec (Aspirin) .... Take 1 Tablet By Mouth Once A Day 11)  Citalopram Hydrobromide 40 Mg Tabs (Citalopram Hydrobromide) .Marland Kitchen.. 1 Once Daily 12)  Zantac 150 Mg Tabs (Ranitidine Hcl) .... 2 At Bedtime 13)  Spironolactone 50 Mg Tabs (Spironolactone) .Marland Kitchen.. 1 Once Daily 14)  Dexilant 60 Mg Cpdr (Dexlansoprazole) .... Take  One 30-60 Min Before First Meal of The Day 15)  Tylenol Extra Strength 500 Mg Tabs (Acetaminophen) .... Per Bottle 16)  Hydrocodone-Acetaminophen 5-325 Mg Tabs (Hydrocodone-Acetaminophen) .Marland Kitchen.. 1-2  Every 4 Hrs As Needed For Cough 17)  Alprazolam 0.5 Mg Tabs (Alprazolam) .... 1/2 To 1 Tab Two Times A Day As Needed 18)  Zolpidem Tartrate 10 Mg Tabs (Zolpidem Tartrate) .Marland Kitchen.. 1 At Bedtime As Needed 19)  Flutter Valve .... Use As Directed 20)  Delsym 30 Mg/69ml Lqcr (Dextromethorphan Polistirex) .... Per Bottle Instructions As Needed  Allergies (verified): 1)  ! 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 MULTIPLE  MYELOMA (ICD-203.00) 2009 s/p bone marrow transplant autologous [facility] Oct 2010 New rx March 30865 = Revlimid  Dr Ignacia Bayley  HYPERTENSION (ICD-401.9) ANXIETY (ICD-300.00) CAD (ICD-414.00) Asthma/ Chronic cough  onset  01/2010      - CT chest 03/14/2010 pos bilateral LL bronchiectasis, mild without fibrosis     - HFA 25-75% Mar 22, 2010 with coaching     - Sinus CT April 26, 2010 mild thickening only, no AF levels     - Allergy profile sent May 10, 2010 > neg     - Alpha one screen neg 05/10/10  Vital Signs:  Patient profile:   70 year old male Weight:      201 pounds O2 Sat:      98 % on Room air Temp:     97.4 degrees F oral Pulse rate:   63 / minute BP sitting:   118 / 72  (left arm)  Vitals Entered By: Vernie Murders (May 24, 2010 11:15 AM)  O2 Flow:  Room air  Physical  Exam  Additional Exam:  amb wm nad with classic pseudowheeze improves with purse lip maneuver  wt 183 Mar 22, 2010 >>183 April 19, 2010 >> 189 April 26, 2010 > 197 May 10, 2010 > 201 May 24, 2010  HEENT mild turbinate edema.  Oropharynx -post pharynx w/ few scattered white patches. No JVD or cervical adenopathy. Mild accessory muscle hypertrophy. Trachea midline, nl thryroid. Chest was hyperinflated by percussion with diminished breath sounds and moderate increased exp time with upper airway insp > exp wheeze. , scattered rhonchi., psuedowheezing  worse on  forced exp. Regular rate and rhythm without murmur gallop or rub or increase P2 or edema.  Abd: no hsm, nl excursion. Ext warm without cyanosis or clubbing.     Impression & Recommendations:  Problem # 1:  COUGH (ICD-786.2)  The most common causes of chronic cough in immunocompetent adults include: upper airway cough syndrome (UACS), previously referred to as postnasal drip syndrome,  caused by variety of rhinosinus conditions; (2) asthma; (3) GERD; (4) chronic bronchitis from cigarette smoking or other inhaled environmental irritants; (5) nonasthmatic eosinophilic bronchitis; and (6) bronchiectasis. These conditions, singly or in combination, have accounted for up to 94% of the causes of chronic cough in prospective studies. This fits best with bronchiectasis which he has documented by Ct chest  and given his h/o best response to cipro may have resistant organisms or MAI but try 10 days of cipro cycles and if better to his satisfaction no further w/u, if not need either a good am sputum sample or fob  Other option is see one of the pulmonary docs at [facility] where he underwent his bm [facility]  Will try off ppi, the reverse of  a therapeutic trial, to see if he flares  Orders: Prescription Created Electronically [phone]) Est. Patient Level IV (29562)  Medications Added to Medication List This Visit: 1)  Delsym 30 Mg/72ml Lqcr (Dextromethorphan  polistirex) .... Per bottle instructions as needed 2)  Cipro 750 Mg Tabs (Ciprofloxacin hcl) .... One by mouth twice daily x 10 days  Patient Instructions: 1)   try off dexilant to see if cough worse and if so start back on prilosec in its place 2)  For nasty mucus go ahead and take cipro 750 twice daily and if not better need a mucus  specimen 72 hours off cipro and then consider bronchoscopy Prescriptions: CIPRO 750 MG  TABS (CIPROFLOXACIN HCL) One by mouth twice daily x 10 days  #20 x 11   Entered and Authorized by:   Nyoka Cowden MD   Signed by:   Nyoka Cowden MD on 05/24/2010   Method used:   Electronically to        [facility] [address] [phone]* (retail)       [address]       [place], [place]  [address]       Ph: [phone] or [phone]       Fax: [phone]   RxID:   [id number]

## 2010-12-13 NOTE — Letter (Signed)
Summary: [facility]  [facility]   Imported By: Lennie Odor 12/06/2010 15:20:52  _____________________________________________________________________  External Attachment:    Type:   Image     Comment:   External Document

## 2010-12-13 NOTE — Assessment & Plan Note (Signed)
Summary: Pulmonary new pt eval cough/wheeze > dulera 50%     Visit Type:  Initial Consult Copy to:  Dr. Lubertha South Primary Provider/Referring Provider:  Dr. Lubertha South  CC:  Cough.  History of Present Illness: 70 yowm quit smoking in 1996 due to MI no resp symptoms at that point.  Mar 22, 2010 cc cough onset March 2011 acute onset like uri with sore throat dry cough esp when feet hit the floor and movement makes it worse, also worse  lying down at night assoc with heart burn and nasal congestion and nose  running.  rx with levaquin now 5/10 and pred 5/10 did help fever but not cough.  on advair and albuterol (but did not initially disclose this) no better.  Presently Pt denies any significant sore throat, dysphagia, itching, sneezing,  nasal congestion or excess secretions,  fever, chills, sweats, unintended wt loss, pleuritic or exertional cp, hempoptysis, change in activity tolerance  orthopnea pnd or leg swelling Pt also denies any obvious fluctuation in symptoms with weather or environmental change or other alleviating or aggravating factors.       Current Medications (verified): 1)  Klor-Con M20 20 Meq Cr-Tabs (Potassium Chloride Crys Cr) .Marland Kitchen.. 1 Two Times A Day 2)  Citalopram Hydrobromide 40 Mg Tabs (Citalopram Hydrobromide) .Marland Kitchen.. 1 Once Daily 3)  Gabapentin 600 Mg Tabs (Gabapentin) .Marland Kitchen.. 1 Four Times A Day 4)  Caltrate 600+d Plus 600-400 Mg-Unit Tabs (Calcium Carbonate-Vit D-Min) .Marland Kitchen.. 1 Once Daily 5)  Revlimid 10 Mg Caps (Lenalidomide) .Marland Kitchen.. 1 Once Daily 6)  Spironolactone 50 Mg Tabs (Spironolactone) .Marland Kitchen.. 1 Once Daily 7)  Alprazolam 0.5 Mg Tabs (Alprazolam) .... 1/2 To 1 Tab Two Times A Day As Needed 8)  Lorazepam 1 Mg Tabs (Lorazepam) .Marland Kitchen.. 1 Every 4 Hrs As Needed 9)  Bayer Aspirin 325 Mg Tabs (Aspirin) .Marland Kitchen.. 1 Once Daily 10)  Vitamin B-6 Cr 200 Mg Cr-Tabs (Pyridoxine Hcl) .Marland Kitchen.. 1 Once Daily 11)  Vitamin E 400 Unit Caps (Vitamin E) .Marland Kitchen.. 1 Once Daily 12)  Zolpidem Tartrate 10 Mg  Tabs (Zolpidem Tartrate) .Marland Kitchen.. 1 At Bedtime As Needed 13)  Amlodipine Besylate 10 Mg Tabs (Amlodipine Besylate) .Marland Kitchen.. 1 Once Daily 14)  Simvastatin 20 Mg Tabs (Simvastatin) .Marland Kitchen.. 1 Once Daily 15)  Hydrocodone-Acetaminophen 5-325 Mg Tabs (Hydrocodone-Acetaminophen) .Marland Kitchen.. 1 Every 4-6 Hrs As Needed 16)  Plavix 75 Mg Tabs (Clopidogrel Bisulfate) .Marland Kitchen.. 1 Once Daily 17)  Folic Acid 1 Mg Tabs (Folic Acid) .Marland Kitchen.. 1 Once Daily 18)  Ferrous Sulfate 325 (65 Fe) Mg Tabs (Ferrous Sulfate) .Marland Kitchen.. 1 Once Daily  Allergies (verified): 1)  ! 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 MULTIPLE  MYELOMA (ICD-203.00) 2009 s/p bone marrow transplant autologous [facility] Oct 2010 New rx Mrach 20011 = Revlimid  Dr Ignacia Bayley HYPERTENSION (ICD-401.9) ANXIETY (ICD-300.00) CAD (ICD-414.00) Asthma/ Chronic cough  onset  01/2010     - HFA 25-75% Mar 22, 2010 with coaching  Past Surgical History: ROTATOR CUFF REPAIR, RIGHT, HX OF (ICD-V45.89) NEPHROLITHIASIS (ICD-592.0) TURP Stent x 5 between 1996 and 2007  Family History: Heart dz- Father Negative for respiratory diseases or atopy   Social History: Married Children Former smoker.  Quit in 1996.  Smoked off and on x 30 yrs approx up to 1 1/2 ppd No ETOH Retired [employer] architect  Review of Systems       The patient complains of shortness of  breath with activity, shortness of breath at rest, productive cough, non-productive cough, acid heartburn, indigestion, nasal congestion/difficulty breathing through nose, anxiety, and joint stiffness or pain.  The patient denies coughing up blood, chest pain, irregular heartbeats, loss of appetite, weight change, abdominal pain, difficulty swallowing, sore throat, tooth/dental problems, headaches, sneezing, itching, ear ache, depression, hand/feet swelling, rash, change in color of mucus, and fever.    Vital  Signs:  Patient profile:   70 year old male Height:      67 inches Weight:      183 pounds BMI:     28.77 O2 Sat:      94 % on Room air Temp:     97.3 degrees F oral Pulse rate:   64 / minute BP sitting:   112 / 62  (left arm)  Vitals Entered By: Vernie Murders (Mar 23, 2010 8:47 AM)  O2 Flow:  Room air  Physical Exam  Additional Exam:  amb wm nad with classic pseudowheeze resolves with purse lip maneuver  wt 183 Mar 22, 2010  HEENT mild turbinate edema.  Oropharynx no thrush or excess pnd or cobblestoning.  No JVD or cervical adenopathy. Mild accessory muscle hypertrophy. Trachea midline, nl thryroid. Chest was hyperinflated by percussion with diminished breath sounds and moderate increased exp time with upper airway insp > exp wheeze. Hoover sign positive at mid inspiration. Regular rate and rhythm without murmur gallop or rub or increase P2 or edema.  Abd: no hsm, nl excursion. Ext warm without cyanosis or clubbing.     CT of Chest  Procedure date:  03/13/2010  Findings:      No ild Mild bronchiectasis in bases  Impression & Recommendations:  Problem # 1:  ASTHMA, UNSPECIFIED (ICD-493.90) DDX of  difficult airways managment all start with A and  include Adherence, Ace Inhibitors, Acid Reflux, Active Sinus Disease, Alpha 1 Antitripsin deficiency, Anxiety masquerading as Airways dz,  ABPA,  allergy(esp in young), Aspiration (esp in elderly), Adverse effects of DPI,  Active smokers, plus one B  = Beta blocker use..    Adherence.  Major issue here and starts with failure to do accurate and complete med reconciliation - without this we will not be able to help him as an outpt.   To keep things simple, I have asked the patient to first separate medicines that are perceived as maintenance, that is to be taken daily "no matter what", from those medicines that are taken on only on an as-needed basis and I have given the patient examples of both, and then return to see our NP to generate a   detailed  medication calendar which should be followed until the next physician sees the patient and updates it.   Once we're sure that we're all reading from the same page in terms of medication admiistration, she needs to be scheduled to follow up with me - his wife appeared upset with this recommendation and I believe I hit a raw nerve - they are clearly in over their heads in terms of being able to follow such a complex regimen that may need to be tweaked considerably to help him, but if  we can't assure adherence there won't be anyway to work with them through this clinic.  I spent extra time with the patient today explaining optimal mdi  technique.  This improved from  25-50% but needs more work Try dulera 100 2 puffs first thing  in am and 2 puffs again in pm about  12 hours later  ? Adverse effect of DPI  try off advair ? Acid reflux diet only for now and avoid oil based vits, add ppi next ov if not improving.  Problem # 2:  ABNORMAL LUNG XRAY (ICD-793.1) Pt convinced he's having a rx to his chemo rx but no evidence of ILD by CT and his problem appears to be mostly airway related. No need to change rx from my perspective.  Medications Added to Medication List This Visit: 1)  Klor-con M20 20 Meq Cr-tabs (Potassium chloride crys cr) .Marland Kitchen.. 1 two times a day 2)  Citalopram Hydrobromide 40 Mg Tabs (Citalopram hydrobromide) .Marland Kitchen.. 1 once daily 3)  Gabapentin 600 Mg Tabs (Gabapentin) .Marland Kitchen.. 1 four times a day 4)  Caltrate 600+d Plus 600-400 Mg-unit Tabs (Calcium carbonate-vit d-min) .Marland Kitchen.. 1 once daily 5)  Revlimid 10 Mg Caps (Lenalidomide) .Marland Kitchen.. 1 once daily 6)  Spironolactone 50 Mg Tabs (Spironolactone) .Marland Kitchen.. 1 once daily 7)  Bayer Aspirin 325 Mg Tabs (Aspirin) .Marland Kitchen.. 1 once daily 8)  Vitamin B-6 Cr 200 Mg Cr-tabs (Pyridoxine hcl) .Marland Kitchen.. 1 once daily 9)  Vitamin E 400 Unit Caps (Vitamin e) .Marland Kitchen.. 1 once daily 10)  Amlodipine Besylate 10 Mg Tabs (Amlodipine besylate) .Marland Kitchen.. 1 once daily 11)  Simvastatin 20 Mg  Tabs (Simvastatin) .Marland Kitchen.. 1 once daily 12)  Plavix 75 Mg Tabs (Clopidogrel bisulfate) .Marland Kitchen.. 1 once daily 13)  Folic Acid 1 Mg Tabs (Folic acid) .Marland Kitchen.. 1 once daily 14)  Ferrous Sulfate 325 (65 Fe) Mg Tabs (Ferrous sulfate) .Marland Kitchen.. 1 once daily 15)  Dulera 100-5 Mcg/act Aero (Mometasone furo-formoterol fum) .... 2 puffs first thing  in am and 2 puffs again in pm about 12 hours later 16)  Hydrocodone-acetaminophen 5-325 Mg Tabs (Hydrocodone-acetaminophen) .Marland Kitchen.. 1 every 4-6 hrs as needed 17)  Zolpidem Tartrate 10 Mg Tabs (Zolpidem tartrate) .Marland Kitchen.. 1 at bedtime as needed 18)  Alprazolam 0.5 Mg Tabs (Alprazolam) .... 1/2 to 1 tab two times a day as needed 19)  Lorazepam 1 Mg Tabs (Lorazepam) .Marland Kitchen.. 1 every 4 hrs as needed  Other Orders: New Patient Level V (04540) HFA Instruction [phone])  Patient Instructions: 1)  Dulera 100 2 puffs first thing  in am and 2 puffs again in pm about 12 hours later  2)  Work on inhaler technique:  relax and blow all the way out then take a nice smooth deep breath back in, triggering the inhaler at same time you start breathing in and hold for a few seconds  3)  Stop advair and other inhalers  4)  For cough use delsym 2 tsp every 12 hours as needed supplement  hydrocodone as needed  5)  GERD (REFLUX)  is a common cause of respiratory symptoms. It commonly presents without heartburn and can be treated with medication, but also with lifestyle changes including avoidance of late meals, excessive alcohol, smoking cessation, and avoid fatty foods, chocolate, peppermint, colas, red wine, and acidic juices such as orange juice. NO MINT OR MENTHOL PRODUCTS SO NO COUGH DROPS  6)  USE SUGARLESS CANDY INSTEAD (jolley ranchers)  7)  NO OIL BASED VITAMINS (no vit E)  8)  See Tammy NP w/in 2 weeks with all your medications, even over the counter meds, separated in two separate bags, the ones you take no matter what vs the ones you stop once you feel better and take only as needed.  She will  generate for you a new user friendly medication calendar that will put [place]  all on the same page re: your medication use.  9)  Unlike when you get a prescription for eyeglasses, it's not possible to always walk out of this or any medical office with a perfect prescription that is immediately effective  based on any test that we offer here.  On the contrary, it may take several weeks for the full impact of changes recommened today - hopefully you will respond well.  If not, then we'll adjust your medication on your next visit accordingly, knowing more then than we can possibly know now.      CT of Chest  Procedure date:  03/13/2010  Findings:      No ild Mild bronchiectasis in bases

## 2010-12-13 NOTE — Progress Notes (Signed)
Summary: cough  Phone Note Call from Patient Call back at [phone]   Caller: Patient Call For: Avett Reineck Summary of Call: pt still coughing was told to call if no better Initial call taken by: Rickard Patience,  April 18, 2010 9:13 AM  Follow-up for Phone Call        Pt staets his dry cough is worse then when he saw TP on 04-10-10. he staets he is rinsing and gargling afer inhaler use and following other recs from TP. He had an appt on 04-24-10 to see TP for f/u but since not better moved up appt to tomorrow 04-19-10 at 11:30. pt aware.  Carron Curie CMA  April 18, 2010 9:25 AM

## 2010-12-19 ENCOUNTER — Other Ambulatory Visit (HOSPITAL_COMMUNITY): Payer: Self-pay | Admitting: Oncology

## 2010-12-19 ENCOUNTER — Encounter (HOSPITAL_COMMUNITY): Payer: Medicare Other | Attending: Oncology

## 2010-12-19 ENCOUNTER — Other Ambulatory Visit (HOSPITAL_COMMUNITY): Payer: Medicare Other

## 2010-12-19 DIAGNOSIS — Z79899 Other long term (current) drug therapy: Secondary | ICD-10-CM | POA: Insufficient documentation

## 2010-12-19 DIAGNOSIS — C9000 Multiple myeloma not having achieved remission: Secondary | ICD-10-CM | POA: Insufficient documentation

## 2010-12-19 DIAGNOSIS — C8589 Other specified types of non-Hodgkin lymphoma, extranodal and solid organ sites: Secondary | ICD-10-CM | POA: Insufficient documentation

## 2010-12-19 LAB — CBC
Hemoglobin: 12.8 g/dL — ABNORMAL LOW (ref 13.0–17.0)
Platelets: 119 10*3/uL — ABNORMAL LOW (ref 150–400)
RBC: 4.18 MIL/uL — ABNORMAL LOW (ref 4.22–5.81)
WBC: 5.1 10*3/uL (ref 4.0–10.5)

## 2010-12-19 LAB — COMPREHENSIVE METABOLIC PANEL
Albumin: 3.9 g/dL (ref 3.5–5.2)
CO2: 25 mEq/L (ref 19–32)
Chloride: 108 mEq/L (ref 96–112)
Creatinine, Ser: 0.99 mg/dL (ref 0.4–1.5)
GFR calc Af Amer: >60 mL/min (ref >60)
Potassium: 4.3 mEq/L (ref 3.5–5.1)
Sodium: 139 mEq/L (ref 135–145)

## 2010-12-19 LAB — DIFFERENTIAL
Basophils Absolute: 0.1 10*3/uL (ref 0.0–0.1)
Basophils Relative: 2 % — ABNORMAL HIGH (ref 0–1)
Eosinophils Absolute: 0.2 10*3/uL (ref 0.0–0.7)
Monocytes Relative: 14 % — ABNORMAL HIGH (ref 3–12)
Neutro Abs: 2 10*3/uL (ref 1.7–7.7)
Neutrophils Relative %: 40 % — ABNORMAL LOW (ref 43–77)

## 2010-12-20 ENCOUNTER — Ambulatory Visit (HOSPITAL_COMMUNITY): Payer: Medicare Other

## 2010-12-20 DIAGNOSIS — C9000 Multiple myeloma not having achieved remission: Secondary | ICD-10-CM

## 2010-12-20 DIAGNOSIS — Z87898 Personal history of other specified conditions: Secondary | ICD-10-CM

## 2010-12-21 LAB — IMMUNOFIXATION ELECTROPHORESIS
IgA: 110 mg/dL (ref 68–378)
IgG (Immunoglobin G), Serum: 849 mg/dL (ref 694–1618)
Total Protein ELP: 6.4 g/dL (ref 6.0–8.3)

## 2010-12-21 LAB — PROTEIN ELECTROPHORESIS, SERUM
Albumin ELP: 62.7 % (ref 55.8–66.1)
Alpha-1-Globulin: 5.1 % — ABNORMAL HIGH (ref 2.9–4.9)
Gamma Globulin: 11.9 % (ref 11.1–18.8)

## 2010-12-26 LAB — KAPPA/LAMBDA LIGHT CHAINS: Kappa free light chain: 2.42 mg/dL — ABNORMAL HIGH (ref 0.33–1.94)

## 2011-01-08 ENCOUNTER — Other Ambulatory Visit (HOSPITAL_COMMUNITY): Payer: Self-pay

## 2011-01-16 ENCOUNTER — Other Ambulatory Visit (HOSPITAL_COMMUNITY): Payer: Self-pay | Admitting: Family Medicine

## 2011-01-16 ENCOUNTER — Encounter (HOSPITAL_COMMUNITY): Payer: Medicare Other | Attending: Oncology

## 2011-01-16 ENCOUNTER — Other Ambulatory Visit (HOSPITAL_COMMUNITY): Payer: Medicare Other

## 2011-01-16 ENCOUNTER — Ambulatory Visit (HOSPITAL_COMMUNITY): Payer: Medicare Other | Admitting: Oncology

## 2011-01-16 ENCOUNTER — Ambulatory Visit (HOSPITAL_COMMUNITY)
Admission: RE | Admit: 2011-01-16 | Discharge: 2011-01-16 | Disposition: A | Discharge status: Home / Self Care | Payer: Medicare Other | Source: Ambulatory Visit | Attending: Family Medicine | Admitting: Family Medicine

## 2011-01-16 DIAGNOSIS — R918 Other nonspecific abnormal finding of lung field: Secondary | ICD-10-CM | POA: Insufficient documentation

## 2011-01-16 DIAGNOSIS — M5030 Other cervical disc degeneration, unspecified cervical region: Secondary | ICD-10-CM | POA: Insufficient documentation

## 2011-01-16 DIAGNOSIS — M549 Dorsalgia, unspecified: Secondary | ICD-10-CM

## 2011-01-16 DIAGNOSIS — C8589 Other specified types of non-Hodgkin lymphoma, extranodal and solid organ sites: Secondary | ICD-10-CM | POA: Insufficient documentation

## 2011-01-16 DIAGNOSIS — C9000 Multiple myeloma not having achieved remission: Secondary | ICD-10-CM

## 2011-01-16 DIAGNOSIS — Z79899 Other long term (current) drug therapy: Secondary | ICD-10-CM | POA: Insufficient documentation

## 2011-01-16 DIAGNOSIS — I10 Essential (primary) hypertension: Secondary | ICD-10-CM | POA: Insufficient documentation

## 2011-01-16 DIAGNOSIS — M546 Pain in thoracic spine: Secondary | ICD-10-CM | POA: Insufficient documentation

## 2011-01-16 DIAGNOSIS — M542 Cervicalgia: Secondary | ICD-10-CM | POA: Insufficient documentation

## 2011-01-17 ENCOUNTER — Ambulatory Visit (HOSPITAL_COMMUNITY): Payer: Medicare Other

## 2011-01-21 LAB — DIFFERENTIAL
Basophils Absolute: 0.1 10*3/uL (ref 0.0–0.1)
Basophils Relative: 1 % (ref 0–1)
Lymphocytes Relative: 42 % (ref 12–46)
Neutro Abs: 1.8 10*3/uL (ref 1.7–7.7)
Neutrophils Relative %: 39 % — ABNORMAL LOW (ref 43–77)

## 2011-01-21 LAB — CBC
HCT: 34.7 % — ABNORMAL LOW (ref 39.0–52.0)
MCH: 31 pg (ref 26.0–34.0)
MCV: 90.4 fL (ref 78.0–100.0)
Platelets: 120 10*3/uL — ABNORMAL LOW (ref 150–400)
RBC: 3.84 MIL/uL — ABNORMAL LOW (ref 4.22–5.81)

## 2011-01-21 LAB — PROTEIN ELECTROPH W RFLX QUANT IMMUNOGLOBULINS
Alpha-2-Globulin: 11.2 % (ref 7.1–11.8)
Beta 2: 4.3 % (ref 3.2–6.5)
Beta Globulin: 6.5 % (ref 4.7–7.2)
M-Spike, %: 0.26 g/dL

## 2011-01-21 LAB — COMPREHENSIVE METABOLIC PANEL
Albumin: 3.8 g/dL (ref 3.5–5.2)
BUN: 16 mg/dL (ref 6–23)
CO2: 25 mEq/L (ref 19–32)
Chloride: 105 mEq/L (ref 96–112)
Creatinine, Ser: 0.97 mg/dL (ref 0.4–1.5)
GFR calc non Af Amer: >60 mL/min (ref >60)
Glucose, Bld: 86 mg/dL (ref 70–99)
Total Bilirubin: 0.5 mg/dL (ref 0.3–1.2)

## 2011-01-21 LAB — IMMUNOFIXATION ADD-ON

## 2011-01-21 LAB — IGG, IGA, IGM
IgA: 71 mg/dL (ref 68–378)
IgG (Immunoglobin G), Serum: 677 mg/dL — ABNORMAL LOW (ref 694–1618)
IgM, Serum: 32 mg/dL — ABNORMAL LOW (ref 60–263)

## 2011-01-22 LAB — DIFFERENTIAL
Basophils Relative: 0 % (ref 0–1)
Lymphs Abs: 1.1 10*3/uL (ref 0.7–4.0)
Monocytes Absolute: 0.7 10*3/uL (ref 0.1–1.0)
Monocytes Relative: 10 % (ref 3–12)
Neutro Abs: 5.2 10*3/uL (ref 1.7–7.7)
Neutrophils Relative %: 72 % (ref 43–77)

## 2011-01-22 LAB — COMPREHENSIVE METABOLIC PANEL
ALT: 27 U/L (ref 0–53)
Albumin: 4.1 g/dL (ref 3.5–5.2)
Alkaline Phosphatase: 64 U/L (ref 39–117)
BUN: 12 mg/dL (ref 6–23)
Calcium: 9.6 mg/dL (ref 8.4–10.5)
Glucose, Bld: 52 mg/dL — ABNORMAL LOW (ref 70–99)
Potassium: 4.4 mEq/L (ref 3.5–5.1)
Sodium: 140 mEq/L (ref 135–145)
Total Protein: 5.9 g/dL — ABNORMAL LOW (ref 6.0–8.3)

## 2011-01-22 LAB — CBC
MCHC: 33.6 g/dL (ref 30.0–36.0)
Platelets: 127 10*3/uL — ABNORMAL LOW (ref 150–400)
RDW: 16 % — ABNORMAL HIGH (ref 11.5–15.5)
WBC: 7.2 10*3/uL (ref 4.0–10.5)

## 2011-01-22 LAB — KAPPA/LAMBDA LIGHT CHAINS: Lambda free light chains: <0.47 mg/dL — ABNORMAL LOW (ref 0.57–2.63)

## 2011-01-22 LAB — PROTEIN ELECTROPHORESIS, SERUM
Gamma Globulin: 8.3 % — ABNORMAL LOW (ref 11.1–18.8)
M-Spike, %: 0.12 g/dL
Total Protein ELP: 6.2 g/dL (ref 6.0–8.3)

## 2011-01-22 LAB — IMMUNOFIXATION ELECTROPHORESIS
IgA: 41 mg/dL — ABNORMAL LOW (ref 68–378)
IgG (Immunoglobin G), Serum: 595 mg/dL — ABNORMAL LOW (ref 694–1618)
IgM, Serum: 21 mg/dL — ABNORMAL LOW (ref 60–263)
Total Protein ELP: 6.2 g/dL (ref 6.0–8.3)

## 2011-01-23 LAB — COMPREHENSIVE METABOLIC PANEL
ALT: 25 U/L (ref 0–53)
AST: 28 U/L (ref 0–37)
Alkaline Phosphatase: 65 U/L (ref 39–117)
CO2: 24 mEq/L (ref 19–32)
Calcium: 9.1 mg/dL (ref 8.4–10.5)
Chloride: 110 mEq/L (ref 96–112)
GFR calc non Af Amer: >60 mL/min (ref >60)
Glucose, Bld: 99 mg/dL (ref 70–99)
Potassium: 4.4 mEq/L (ref 3.5–5.1)
Sodium: 139 mEq/L (ref 135–145)
Total Bilirubin: 0.3 mg/dL (ref 0.3–1.2)

## 2011-01-24 ENCOUNTER — Ambulatory Visit (HOSPITAL_COMMUNITY): Payer: Medicare Other

## 2011-01-24 DIAGNOSIS — C9000 Multiple myeloma not having achieved remission: Secondary | ICD-10-CM

## 2011-01-24 LAB — COMPREHENSIVE METABOLIC PANEL
ALT: 22 U/L (ref 0–53)
Albumin: 3.9 g/dL (ref 3.5–5.2)
Alkaline Phosphatase: 59 U/L (ref 39–117)
Calcium: 8.9 mg/dL (ref 8.4–10.5)
GFR calc Af Amer: >60 mL/min (ref >60)
Potassium: 4.1 mEq/L (ref 3.5–5.1)
Sodium: 137 mEq/L (ref 135–145)
Total Protein: 6.4 g/dL (ref 6.0–8.3)

## 2011-01-24 LAB — IGG, IGA, IGM
IgA: 34 mg/dL — ABNORMAL LOW (ref 68–378)
IgG (Immunoglobin G), Serum: 498 mg/dL — ABNORMAL LOW (ref 694–1618)
IgM, Serum: 14 mg/dL — ABNORMAL LOW (ref 60–263)

## 2011-01-24 LAB — CBC
MCHC: 34.1 g/dL (ref 30.0–36.0)
Platelets: 127 10*3/uL — ABNORMAL LOW (ref 150–400)
RDW: 18.1 % — ABNORMAL HIGH (ref 11.5–15.5)
WBC: 5.7 10*3/uL (ref 4.0–10.5)

## 2011-01-24 LAB — PROTEIN ELECTROPH W RFLX QUANT IMMUNOGLOBULINS
Alpha-2-Globulin: 12.9 % — ABNORMAL HIGH (ref 7.1–11.8)
Beta Globulin: 6.8 % (ref 4.7–7.2)
Gamma Globulin: 8.1 % — ABNORMAL LOW (ref 11.1–18.8)
M-Spike, %: 0.15 g/dL
Total Protein ELP: 5.9 g/dL — ABNORMAL LOW (ref 6.0–8.3)

## 2011-01-24 LAB — DIFFERENTIAL
Basophils Relative: 0 % (ref 0–1)
Eosinophils Absolute: 0.1 10*3/uL (ref 0.0–0.7)
Lymphs Abs: 1.4 10*3/uL (ref 0.7–4.0)
Monocytes Absolute: 0.6 10*3/uL (ref 0.1–1.0)
Monocytes Relative: 10 % (ref 3–12)
Neutrophils Relative %: 63 % (ref 43–77)

## 2011-01-24 LAB — KAPPA/LAMBDA LIGHT CHAINS: Lambda free light chains: <0.42 mg/dL — ABNORMAL LOW (ref 0.57–2.63)

## 2011-01-24 LAB — CULTURE, RESPIRATORY W GRAM STAIN

## 2011-01-25 LAB — COMPREHENSIVE METABOLIC PANEL
AST: 33 U/L (ref 0–37)
BUN: 17 mg/dL (ref 6–23)
CO2: 28 mEq/L (ref 19–32)
Calcium: 8.8 mg/dL (ref 8.4–10.5)
Chloride: 104 mEq/L (ref 96–112)
Creatinine, Ser: 0.91 mg/dL (ref 0.4–1.5)
GFR calc non Af Amer: >60 mL/min (ref >60)
Glucose, Bld: 154 mg/dL — ABNORMAL HIGH (ref 70–99)
Total Bilirubin: 0.5 mg/dL (ref 0.3–1.2)

## 2011-01-25 LAB — CBC
HCT: 30.4 % — ABNORMAL LOW (ref 39.0–52.0)
Hemoglobin: 10.3 g/dL — ABNORMAL LOW (ref 13.0–17.0)
MCH: 32.3 pg (ref 26.0–34.0)
MCHC: 33.7 g/dL (ref 30.0–36.0)
MCV: 95.9 fL (ref 78.0–100.0)
RBC: 3.17 MIL/uL — ABNORMAL LOW (ref 4.22–5.81)

## 2011-01-25 LAB — DIFFERENTIAL
Basophils Absolute: 0 10*3/uL (ref 0.0–0.1)
Eosinophils Relative: 2 % (ref 0–5)
Lymphocytes Relative: 16 % (ref 12–46)
Neutro Abs: 2.9 10*3/uL (ref 1.7–7.7)
Neutrophils Relative %: 72 % (ref 43–77)

## 2011-01-27 LAB — DIFFERENTIAL
Basophils Relative: 0 % (ref 0–1)
Basophils Relative: 0 % (ref 0–1)
Eosinophils Absolute: 0 10*3/uL (ref 0.0–0.7)
Eosinophils Absolute: 0.1 10*3/uL (ref 0.0–0.7)
Eosinophils Absolute: 0 10*3/uL (ref 0.0–0.7)
Eosinophils Relative: 0 % (ref 0–5)
Lymphs Abs: 1.6 10*3/uL (ref 0.7–4.0)
Lymphs Abs: 0.8 10*3/uL (ref 0.7–4.0)
Monocytes Absolute: 0.3 10*3/uL (ref 0.1–1.0)
Monocytes Absolute: 0.3 10*3/uL (ref 0.1–1.0)
Monocytes Relative: 8 % (ref 3–12)
Monocytes Relative: 12 % (ref 3–12)
Monocytes Relative: 5 % (ref 3–12)
Neutrophils Relative %: 72 % (ref 43–77)
Neutrophils Relative %: 53 % (ref 43–77)

## 2011-01-27 LAB — COMPREHENSIVE METABOLIC PANEL
ALT: 27 U/L (ref 0–53)
ALT: 18 U/L (ref 0–53)
Albumin: 3.6 g/dL (ref 3.5–5.2)
Alkaline Phosphatase: 55 U/L (ref 39–117)
BUN: 16 mg/dL (ref 6–23)
CO2: 27 mEq/L (ref 19–32)
Calcium: 9.3 mg/dL (ref 8.4–10.5)
Calcium: 9.4 mg/dL (ref 8.4–10.5)
Chloride: 109 mEq/L (ref 96–112)
Creatinine, Ser: 0.99 mg/dL (ref 0.4–1.5)
GFR calc Af Amer: >60 mL/min (ref >60)
GFR calc Af Amer: >60 mL/min (ref >60)
GFR calc non Af Amer: >60 mL/min (ref >60)
Glucose, Bld: 101 mg/dL — ABNORMAL HIGH (ref 70–99)
Glucose, Bld: 92 mg/dL (ref 70–99)
Glucose, Bld: 197 mg/dL — ABNORMAL HIGH (ref 70–99)
Potassium: 3.9 mEq/L (ref 3.5–5.1)
Sodium: 137 mEq/L (ref 135–145)
Sodium: 139 mEq/L (ref 135–145)
Total Protein: 6.1 g/dL (ref 6.0–8.3)
Total Protein: 5.9 g/dL — ABNORMAL LOW (ref 6.0–8.3)

## 2011-01-27 LAB — CBC
HCT: 31.6 % — ABNORMAL LOW (ref 39.0–52.0)
HCT: 34.3 % — ABNORMAL LOW (ref 39.0–52.0)
Hemoglobin: 12 g/dL — ABNORMAL LOW (ref 13.0–17.0)
Hemoglobin: 11.5 g/dL — ABNORMAL LOW (ref 13.0–17.0)
MCHC: 34.3 g/dL (ref 30.0–36.0)
MCHC: 33.5 g/dL (ref 30.0–36.0)
MCV: 95.2 fL (ref 78.0–100.0)
RBC: 3.28 MIL/uL — ABNORMAL LOW (ref 4.22–5.81)
RBC: 3.61 MIL/uL — ABNORMAL LOW (ref 4.22–5.81)
RDW: 19.3 % — ABNORMAL HIGH (ref 11.5–15.5)
RDW: 14.7 % (ref 11.5–15.5)
WBC: 4.2 10*3/uL (ref 4.0–10.5)

## 2011-01-27 LAB — PROTEIN ELECTROPH W RFLX QUANT IMMUNOGLOBULINS
Alpha-1-Globulin: 5.4 % — ABNORMAL HIGH (ref 2.9–4.9)
Alpha-2-Globulin: 11 % (ref 7.1–11.8)
Beta 2: 4.1 % (ref 3.2–6.5)
Gamma Globulin: 14.2 % (ref 11.1–18.8)

## 2011-01-27 LAB — PROTEIN ELECTROPHORESIS, SERUM
Alpha-2-Globulin: 10.6 % (ref 7.1–11.8)
Beta Globulin: 6.3 % (ref 4.7–7.2)
Gamma Globulin: 21.5 % — ABNORMAL HIGH (ref 11.1–18.8)
M-Spike, %: NOT DETECTED g/dL
Total Protein ELP: 6.7 g/dL (ref 6.0–8.3)

## 2011-01-27 LAB — IMMUNOFIXATION ELECTROPHORESIS
IgA: <7 mg/dL — ABNORMAL LOW (ref 68–378)
IgA: <7 mg/dL — ABNORMAL LOW (ref 68–378)
IgG (Immunoglobin G), Serum: 1570 mg/dL (ref 694–1618)
IgM, Serum: <4 mg/dL — ABNORMAL LOW (ref 60–263)
IgM, Serum: 5 mg/dL — ABNORMAL LOW (ref 60–263)

## 2011-01-27 LAB — IMMUNOFIXATION ADD-ON

## 2011-01-27 LAB — IGG, IGA, IGM: IgG (Immunoglobin G), Serum: 866 mg/dL (ref 694–1618)

## 2011-01-28 LAB — CBC
HCT: 32.5 % — ABNORMAL LOW (ref 39.0–52.0)
Hemoglobin: 11.2 g/dL — ABNORMAL LOW (ref 13.0–17.0)
MCHC: 34.4 g/dL (ref 30.0–36.0)
MCV: 96.9 fL (ref 78.0–100.0)
Platelets: 66 10*3/uL — ABNORMAL LOW (ref 150–400)
Platelets: 88 10*3/uL — ABNORMAL LOW (ref 150–400)
RBC: 3.35 MIL/uL — ABNORMAL LOW (ref 4.22–5.81)
RDW: 18.4 % — ABNORMAL HIGH (ref 11.5–15.5)
RDW: 18.5 % — ABNORMAL HIGH (ref 11.5–15.5)
WBC: 2.3 10*3/uL — ABNORMAL LOW (ref 4.0–10.5)
WBC: 2.5 10*3/uL — ABNORMAL LOW (ref 4.0–10.5)

## 2011-01-28 LAB — DIFFERENTIAL
Basophils Absolute: 0 10*3/uL (ref 0.0–0.1)
Basophils Relative: 0 % (ref 0–1)
Eosinophils Absolute: 0 10*3/uL (ref 0.0–0.7)
Lymphocytes Relative: 31 % (ref 12–46)
Lymphs Abs: 0.7 10*3/uL (ref 0.7–4.0)
Monocytes Relative: 11 % (ref 3–12)
Neutro Abs: 1.4 10*3/uL — ABNORMAL LOW (ref 1.7–7.7)
Neutro Abs: 1.5 10*3/uL — ABNORMAL LOW (ref 1.7–7.7)
Neutrophils Relative %: 58 % (ref 43–77)
Neutrophils Relative %: 60 % (ref 43–77)

## 2011-01-28 LAB — CULTURE, BLOOD (ROUTINE X 2)
Culture: NO GROWTH
Report Status: 5292011

## 2011-01-28 LAB — SAMPLE TO BLOOD BANK

## 2011-01-28 LAB — BASIC METABOLIC PANEL
CO2: 22 mEq/L (ref 19–32)
Chloride: 104 mEq/L (ref 96–112)
Creatinine, Ser: 0.92 mg/dL (ref 0.4–1.5)
GFR calc Af Amer: >60 mL/min (ref >60)
Potassium: 4.1 mEq/L (ref 3.5–5.1)
Sodium: 134 mEq/L — ABNORMAL LOW (ref 135–145)

## 2011-01-28 LAB — EXPECTORATED SPUTUM ASSESSMENT W GRAM STAIN, RFLX TO RESP C

## 2011-01-28 LAB — CULTURE, RESPIRATORY W GRAM STAIN

## 2011-01-28 LAB — GLUCOSE, CAPILLARY: Glucose-Capillary: 94 mg/dL (ref 70–99)

## 2011-01-29 LAB — PROTEIN ELECTROPH W RFLX QUANT IMMUNOGLOBULINS
Albumin ELP: 52.6 % — ABNORMAL LOW (ref 55.8–66.1)
Alpha-2-Globulin: 14 % — ABNORMAL HIGH (ref 7.1–11.8)
Beta Globulin: 6.6 % (ref 4.7–7.2)
Total Protein ELP: 5.6 g/dL — ABNORMAL LOW (ref 6.0–8.3)

## 2011-01-29 LAB — DIFFERENTIAL
Basophils Absolute: 0 10*3/uL (ref 0.0–0.1)
Lymphocytes Relative: 31 % (ref 12–46)
Lymphocytes Relative: 29 % (ref 12–46)
Lymphs Abs: 0.3 10*3/uL — ABNORMAL LOW (ref 0.7–4.0)
Monocytes Absolute: 0.6 10*3/uL (ref 0.1–1.0)
Neutro Abs: 1.4 10*3/uL — ABNORMAL LOW (ref 1.7–7.7)
Neutrophils Relative %: 58 % (ref 43–77)
Smear Review: DECREASED

## 2011-01-29 LAB — CBC
Hemoglobin: 10.9 g/dL — ABNORMAL LOW (ref 13.0–17.0)
MCHC: 35.4 g/dL (ref 30.0–36.0)
MCV: 94.9 fL (ref 78.0–100.0)
RBC: 2.5 MIL/uL — ABNORMAL LOW (ref 4.22–5.81)
RBC: 3.22 MIL/uL — ABNORMAL LOW (ref 4.22–5.81)
RDW: 17.5 % — ABNORMAL HIGH (ref 11.5–15.5)
RDW: 18.8 % — ABNORMAL HIGH (ref 11.5–15.5)

## 2011-01-29 LAB — COMPREHENSIVE METABOLIC PANEL
CO2: 29 mEq/L (ref 19–32)
Calcium: 9.2 mg/dL (ref 8.4–10.5)
Creatinine, Ser: 1.05 mg/dL (ref 0.4–1.5)
GFR calc Af Amer: >60 mL/min (ref >60)
GFR calc non Af Amer: >60 mL/min (ref >60)
Glucose, Bld: 118 mg/dL — ABNORMAL HIGH (ref 70–99)

## 2011-01-29 LAB — KAPPA/LAMBDA LIGHT CHAINS
Kappa free light chain: <0.54 mg/dL (ref 0.33–1.94)
Lambda free light chains: <0.42 mg/dL — ABNORMAL LOW (ref 0.57–2.63)

## 2011-01-29 LAB — IMMUNOFIXATION ELECTROPHORESIS
IgM, Serum: <4 mg/dL — ABNORMAL LOW (ref 60–263)
Total Protein ELP: 4.6 g/dL — ABNORMAL LOW (ref 6.0–8.3)

## 2011-01-30 LAB — COMPREHENSIVE METABOLIC PANEL
ALT: 31 U/L (ref 0–53)
AST: 28 U/L (ref 0–37)
Alkaline Phosphatase: 63 U/L (ref 39–117)
CO2: 22 mEq/L (ref 19–32)
Chloride: 107 mEq/L (ref 96–112)
GFR calc Af Amer: >60 mL/min (ref >60)
GFR calc non Af Amer: >60 mL/min (ref >60)
Potassium: 4.9 mEq/L (ref 3.5–5.1)
Sodium: 135 mEq/L (ref 135–145)
Total Bilirubin: 0.5 mg/dL (ref 0.3–1.2)

## 2011-01-30 LAB — DIFFERENTIAL
Basophils Absolute: 0 10*3/uL (ref 0.0–0.1)
Basophils Relative: 0 % (ref 0–1)
Basophils Relative: 0 % (ref 0–1)
Basophils Relative: 2 % — ABNORMAL HIGH (ref 0–1)
Eosinophils Absolute: 0 10*3/uL (ref 0.0–0.7)
Eosinophils Absolute: 0.1 10*3/uL (ref 0.0–0.7)
Eosinophils Absolute: 0.1 10*3/uL (ref 0.0–0.7)
Eosinophils Relative: 2 % (ref 0–5)
Eosinophils Relative: 2 % (ref 0–5)
Eosinophils Relative: 6 % — ABNORMAL HIGH (ref 0–5)
Lymphs Abs: 0.9 10*3/uL (ref 0.7–4.0)
Lymphs Abs: 1.8 10*3/uL (ref 0.7–4.0)
Monocytes Absolute: 0.2 10*3/uL (ref 0.1–1.0)
Monocytes Relative: 5 % (ref 3–12)
Neutro Abs: 0.8 10*3/uL — ABNORMAL LOW (ref 1.7–7.7)
Neutro Abs: 2.5 10*3/uL (ref 1.7–7.7)
Neutrophils Relative %: 56 % (ref 43–77)

## 2011-01-30 LAB — CBC
HCT: 37.9 % — ABNORMAL LOW (ref 39.0–52.0)
Hemoglobin: 12.4 g/dL — ABNORMAL LOW (ref 13.0–17.0)
Hemoglobin: 12.8 g/dL — ABNORMAL LOW (ref 13.0–17.0)
MCHC: 34 g/dL (ref 30.0–36.0)
MCHC: 34.3 g/dL (ref 30.0–36.0)
MCV: 94.9 fL (ref 78.0–100.0)
Platelets: 92 10*3/uL — ABNORMAL LOW (ref 150–400)
RBC: 3.56 MIL/uL — ABNORMAL LOW (ref 4.22–5.81)
RBC: 3.98 MIL/uL — ABNORMAL LOW (ref 4.22–5.81)
RDW: 14.4 % (ref 11.5–15.5)
RDW: 14.6 % (ref 11.5–15.5)
WBC: 1.3 10*3/uL — CL (ref 4.0–10.5)
WBC: 4.5 10*3/uL (ref 4.0–10.5)

## 2011-01-31 ENCOUNTER — Other Ambulatory Visit (HOSPITAL_COMMUNITY): Payer: Self-pay | Admitting: Family Medicine

## 2011-01-31 ENCOUNTER — Ambulatory Visit (HOSPITAL_COMMUNITY)
Admission: RE | Admit: 2011-01-31 | Discharge: 2011-01-31 | Disposition: A | Discharge status: Home / Self Care | Payer: Medicare Other | Source: Ambulatory Visit | Attending: Family Medicine | Admitting: Family Medicine

## 2011-01-31 DIAGNOSIS — R059 Cough, unspecified: Secondary | ICD-10-CM | POA: Insufficient documentation

## 2011-01-31 DIAGNOSIS — R918 Other nonspecific abnormal finding of lung field: Secondary | ICD-10-CM | POA: Insufficient documentation

## 2011-01-31 DIAGNOSIS — J189 Pneumonia, unspecified organism: Secondary | ICD-10-CM | POA: Insufficient documentation

## 2011-01-31 DIAGNOSIS — R05 Cough: Secondary | ICD-10-CM | POA: Insufficient documentation

## 2011-02-03 LAB — COMPREHENSIVE METABOLIC PANEL
ALT: 29 U/L (ref 0–53)
Alkaline Phosphatase: 58 U/L (ref 39–117)
BUN: 18 mg/dL (ref 6–23)
Chloride: 104 mEq/L (ref 96–112)
Glucose, Bld: 108 mg/dL — ABNORMAL HIGH (ref 70–99)
Potassium: 4.4 mEq/L (ref 3.5–5.1)
Total Bilirubin: 0.5 mg/dL (ref 0.3–1.2)

## 2011-02-03 LAB — CBC
HCT: 33.4 % — ABNORMAL LOW (ref 39.0–52.0)
HCT: 34.6 % — ABNORMAL LOW (ref 39.0–52.0)
Hemoglobin: 10.6 g/dL — ABNORMAL LOW (ref 13.0–17.0)
Hemoglobin: 11.8 g/dL — ABNORMAL LOW (ref 13.0–17.0)
Hemoglobin: 12.1 g/dL — ABNORMAL LOW (ref 13.0–17.0)
Hemoglobin: 12.5 g/dL — ABNORMAL LOW (ref 13.0–17.0)
Platelets: 94 10*3/uL — ABNORMAL LOW (ref 150–400)
RBC: 3.25 MIL/uL — ABNORMAL LOW (ref 4.22–5.81)
RBC: 3.63 MIL/uL — ABNORMAL LOW (ref 4.22–5.81)
RBC: 3.67 MIL/uL — ABNORMAL LOW (ref 4.22–5.81)
RDW: 15.9 % — ABNORMAL HIGH (ref 11.5–15.5)
RDW: 16.9 % — ABNORMAL HIGH (ref 11.5–15.5)
RDW: 17.1 % — ABNORMAL HIGH (ref 11.5–15.5)
WBC: 2.4 10*3/uL — ABNORMAL LOW (ref 4.0–10.5)
WBC: 2.5 10*3/uL — ABNORMAL LOW (ref 4.0–10.5)

## 2011-02-03 LAB — IGG, IGA, IGM
IgA: <7 mg/dL — ABNORMAL LOW (ref 68–378)
IgG (Immunoglobin G), Serum: 174 mg/dL — ABNORMAL LOW (ref 694–1618)
IgM, Serum: <4 mg/dL — ABNORMAL LOW (ref 60–263)

## 2011-02-03 LAB — DIFFERENTIAL
Basophils Absolute: 0 10*3/uL (ref 0.0–0.1)
Basophils Absolute: 0 10*3/uL (ref 0.0–0.1)
Basophils Absolute: 0 10*3/uL (ref 0.0–0.1)
Basophils Absolute: 0 10*3/uL (ref 0.0–0.1)
Basophils Relative: 0 % (ref 0–1)
Basophils Relative: 1 % (ref 0–1)
Eosinophils Absolute: 0 10*3/uL (ref 0.0–0.7)
Eosinophils Relative: 10 % — ABNORMAL HIGH (ref 0–5)
Lymphocytes Relative: 32 % (ref 12–46)
Lymphocytes Relative: 35 % (ref 12–46)
Lymphocytes Relative: 41 % (ref 12–46)
Monocytes Absolute: 0.2 10*3/uL (ref 0.1–1.0)
Monocytes Absolute: 0.4 10*3/uL (ref 0.1–1.0)
Monocytes Relative: 11 % (ref 3–12)
Neutro Abs: 0.8 10*3/uL — ABNORMAL LOW (ref 1.7–7.7)
Neutro Abs: 1.3 10*3/uL — ABNORMAL LOW (ref 1.7–7.7)
Neutro Abs: 1.5 10*3/uL — ABNORMAL LOW (ref 1.7–7.7)
Neutrophils Relative %: 37 % — ABNORMAL LOW (ref 43–77)
Neutrophils Relative %: 38 % — ABNORMAL LOW (ref 43–77)
Neutrophils Relative %: 46 % (ref 43–77)
Neutrophils Relative %: 52 % (ref 43–77)

## 2011-02-03 LAB — PROTEIN ELECTROPH W RFLX QUANT IMMUNOGLOBULINS
Beta 2: 4.2 % (ref 3.2–6.5)
Beta Globulin: 6.6 % (ref 4.7–7.2)
Gamma Globulin: 3.5 % — ABNORMAL LOW (ref 11.1–18.8)

## 2011-02-03 LAB — IMMUNOFIXATION ADD-ON

## 2011-02-03 LAB — IMMUNOFIXATION ELECTROPHORESIS
IgG (Immunoglobin G), Serum: 185 mg/dL — ABNORMAL LOW (ref 694–1618)
Total Protein ELP: 5.3 g/dL — ABNORMAL LOW (ref 6.0–8.3)

## 2011-02-13 LAB — BASIC METABOLIC PANEL
BUN: 6 mg/dL (ref 6–23)
BUN: 8 mg/dL (ref 6–23)
CO2: 28 mEq/L (ref 19–32)
Calcium: 9.1 mg/dL (ref 8.4–10.5)
Chloride: 106 mEq/L (ref 96–112)
Chloride: 106 mEq/L (ref 96–112)
Creatinine, Ser: 0.71 mg/dL (ref 0.4–1.5)
GFR calc Af Amer: >60 mL/min (ref >60)
GFR calc Af Amer: >60 mL/min (ref >60)
GFR calc Af Amer: >60 mL/min (ref >60)
GFR calc non Af Amer: 55 mL/min — ABNORMAL LOW (ref >60)
Glucose, Bld: 98 mg/dL (ref 70–99)
Potassium: 3.7 mEq/L (ref 3.5–5.1)
Potassium: 3.9 mEq/L (ref 3.5–5.1)
Sodium: 138 mEq/L (ref 135–145)

## 2011-02-13 LAB — CBC
HCT: 32 % — ABNORMAL LOW (ref 39.0–52.0)
HCT: 33.6 % — ABNORMAL LOW (ref 39.0–52.0)
HCT: 34.7 % — ABNORMAL LOW (ref 39.0–52.0)
Hemoglobin: 12 g/dL — ABNORMAL LOW (ref 13.0–17.0)
MCHC: 34.2 g/dL (ref 30.0–36.0)
MCHC: 34.7 g/dL (ref 30.0–36.0)
MCV: 93.1 fL (ref 78.0–100.0)
MCV: 91.1 fL (ref 78.0–100.0)
MCV: 92.4 fL (ref 78.0–100.0)
Platelets: 164 10*3/uL (ref 150–400)
RBC: 3.77 MIL/uL — ABNORMAL LOW (ref 4.22–5.81)
RBC: 3.46 MIL/uL — ABNORMAL LOW (ref 4.22–5.81)
RBC: 3.67 MIL/uL — ABNORMAL LOW (ref 4.22–5.81)
RBC: 3.81 MIL/uL — ABNORMAL LOW (ref 4.22–5.81)
RDW: 16.9 % — ABNORMAL HIGH (ref 11.5–15.5)
WBC: 6.4 10*3/uL (ref 4.0–10.5)
WBC: 6.7 10*3/uL (ref 4.0–10.5)
WBC: 7.5 10*3/uL (ref 4.0–10.5)

## 2011-02-13 LAB — COMPREHENSIVE METABOLIC PANEL
BUN: 6 mg/dL (ref 6–23)
CO2: 27 mEq/L (ref 19–32)
Calcium: 9.2 mg/dL (ref 8.4–10.5)
Chloride: 99 mEq/L (ref 96–112)
Creatinine, Ser: 0.72 mg/dL (ref 0.4–1.5)
GFR calc non Af Amer: >60 mL/min (ref >60)
Glucose, Bld: 156 mg/dL — ABNORMAL HIGH (ref 70–99)
Total Bilirubin: 0.7 mg/dL (ref 0.3–1.2)

## 2011-02-13 LAB — DIFFERENTIAL
Basophils Absolute: 0 10*3/uL (ref 0.0–0.1)
Basophils Absolute: 0 10*3/uL (ref 0.0–0.1)
Basophils Relative: 1 % (ref 0–1)
Eosinophils Absolute: 0.1 10*3/uL (ref 0.0–0.7)
Eosinophils Absolute: 0.1 10*3/uL (ref 0.0–0.7)
Eosinophils Relative: 0 % (ref 0–5)
Eosinophils Relative: 0 % (ref 0–5)
Eosinophils Relative: 1 % (ref 0–5)
Lymphocytes Relative: 47 % — ABNORMAL HIGH (ref 12–46)
Lymphocytes Relative: 55 % — ABNORMAL HIGH (ref 12–46)
Lymphs Abs: 3.5 10*3/uL (ref 0.7–4.0)
Lymphs Abs: 4.2 10*3/uL — ABNORMAL HIGH (ref 0.7–4.0)
Monocytes Absolute: 0.6 10*3/uL (ref 0.1–1.0)
Monocytes Relative: 10 % (ref 3–12)
Monocytes Relative: 13 % — ABNORMAL HIGH (ref 3–12)
Neutro Abs: 2.4 10*3/uL (ref 1.7–7.7)
Neutro Abs: 2.3 10*3/uL (ref 1.7–7.7)
Neutrophils Relative %: 42 % — ABNORMAL LOW (ref 43–77)
Neutrophils Relative %: 37 % — ABNORMAL LOW (ref 43–77)

## 2011-02-13 LAB — MAGNESIUM
Magnesium: 1.8 mg/dL (ref 1.5–2.5)
Magnesium: 2 mg/dL (ref 1.5–2.5)

## 2011-02-15 LAB — CBC
HCT: 27 % — ABNORMAL LOW (ref 39.0–52.0)
HCT: 29.3 % — ABNORMAL LOW (ref 39.0–52.0)
Hemoglobin: 10.4 g/dL — ABNORMAL LOW (ref 13.0–17.0)
MCHC: 33.5 g/dL (ref 30.0–36.0)
MCHC: 35.4 g/dL (ref 30.0–36.0)
MCV: 93.5 fL (ref 78.0–100.0)
MCV: 94.8 fL (ref 78.0–100.0)
MCV: 95.1 fL (ref 78.0–100.0)
MCV: 96.1 fL (ref 78.0–100.0)
Platelets: 28 10*3/uL — CL (ref 150–400)
Platelets: 399 10*3/uL (ref 150–400)
Platelets: 45 10*3/uL — CL (ref 150–400)
Platelets: 65 10*3/uL — ABNORMAL LOW (ref 150–400)
RBC: 2.84 MIL/uL — ABNORMAL LOW (ref 4.22–5.81)
RBC: 2.86 MIL/uL — ABNORMAL LOW (ref 4.22–5.81)
RDW: 14 % (ref 11.5–15.5)
RDW: 14.1 % (ref 11.5–15.5)
WBC: 0.6 10*3/uL — CL (ref 4.0–10.5)
WBC: 12.1 10*3/uL — ABNORMAL HIGH (ref 4.0–10.5)

## 2011-02-15 LAB — DIFFERENTIAL
Basophils Relative: 1 % (ref 0–1)
Eosinophils Absolute: 0 10*3/uL (ref 0.0–0.7)
Eosinophils Absolute: 0.3 10*3/uL (ref 0.0–0.7)
Eosinophils Relative: 5 % (ref 0–5)
Lymphocytes Relative: 3 % — ABNORMAL LOW (ref 12–46)
Lymphs Abs: 0.4 10*3/uL — ABNORMAL LOW (ref 0.7–4.0)
Monocytes Relative: 10 % (ref 3–12)
Monocytes Relative: 6 % (ref 3–12)
Neutrophils Relative %: 71 % (ref 43–77)
Neutrophils Relative %: 91 % — ABNORMAL HIGH (ref 43–77)
Smear Review: DECREASED
Smear Review: DECREASED

## 2011-02-15 LAB — COMPREHENSIVE METABOLIC PANEL
Albumin: 3.3 g/dL — ABNORMAL LOW (ref 3.5–5.2)
BUN: 13 mg/dL (ref 6–23)
Calcium: 9.4 mg/dL (ref 8.4–10.5)
Creatinine, Ser: 0.71 mg/dL (ref 0.4–1.5)
Glucose, Bld: 105 mg/dL — ABNORMAL HIGH (ref 70–99)
Total Protein: 5.1 g/dL — ABNORMAL LOW (ref 6.0–8.3)

## 2011-02-15 LAB — BASIC METABOLIC PANEL
BUN: 14 mg/dL (ref 6–23)
CO2: 28 mEq/L (ref 19–32)
Chloride: 106 mEq/L (ref 96–112)
Creatinine, Ser: 0.73 mg/dL (ref 0.4–1.5)

## 2011-02-15 LAB — APTT: aPTT: 29 seconds (ref 24–37)

## 2011-02-17 LAB — PROTEIN ELECTROPHORESIS, SERUM
Alpha-2-Globulin: 9.6 % (ref 7.1–11.8)
Alpha-2-Globulin: 13.1 % — ABNORMAL HIGH (ref 7.1–11.8)
Gamma Globulin: 8.6 % — ABNORMAL LOW (ref 11.1–18.8)
M-Spike, %: 0.36 g/dL
M-Spike, %: 0.34 g/dL
Total Protein ELP: 5.6 g/dL — ABNORMAL LOW (ref 6.0–8.3)
Total Protein ELP: 5.4 g/dL — ABNORMAL LOW (ref 6.0–8.3)

## 2011-02-17 LAB — IMMUNOFIXATION ELECTROPHORESIS
IgA: 14 mg/dL — ABNORMAL LOW (ref 68–378)
IgM, Serum: 13 mg/dL — ABNORMAL LOW (ref 60–263)
IgM, Serum: 16 mg/dL — ABNORMAL LOW (ref 60–263)
Total Protein ELP: 5.4 g/dL — ABNORMAL LOW (ref 6.0–8.3)

## 2011-02-17 LAB — DIFFERENTIAL
Basophils Absolute: 0 10*3/uL (ref 0.0–0.1)
Basophils Absolute: 0 10*3/uL (ref 0.0–0.1)
Basophils Relative: 0 % (ref 0–1)
Basophils Relative: 0 % (ref 0–1)
Eosinophils Absolute: 0 10*3/uL (ref 0.0–0.7)
Eosinophils Relative: 1 % (ref 0–5)
Lymphocytes Relative: 12 % (ref 12–46)
Monocytes Absolute: 0.7 10*3/uL (ref 0.1–1.0)
Monocytes Relative: 11 % (ref 3–12)
Neutro Abs: 7 10*3/uL (ref 1.7–7.7)
Neutrophils Relative %: 84 % — ABNORMAL HIGH (ref 43–77)

## 2011-02-17 LAB — COMPREHENSIVE METABOLIC PANEL
ALT: 48 U/L (ref 0–53)
AST: 28 U/L (ref 0–37)
Albumin: 3.3 g/dL — ABNORMAL LOW (ref 3.5–5.2)
Alkaline Phosphatase: 71 U/L (ref 39–117)
Alkaline Phosphatase: 73 U/L (ref 39–117)
BUN: 14 mg/dL (ref 6–23)
CO2: 28 mEq/L (ref 19–32)
Chloride: 106 mEq/L (ref 96–112)
Chloride: 106 mEq/L (ref 96–112)
GFR calc Af Amer: >60 mL/min (ref >60)
GFR calc non Af Amer: >60 mL/min (ref >60)
Glucose, Bld: 114 mg/dL — ABNORMAL HIGH (ref 70–99)
Potassium: 3.8 mEq/L (ref 3.5–5.1)
Potassium: 3.6 mEq/L (ref 3.5–5.1)
Sodium: 139 mEq/L (ref 135–145)
Total Bilirubin: 0.6 mg/dL (ref 0.3–1.2)
Total Bilirubin: 0.4 mg/dL (ref 0.3–1.2)

## 2011-02-17 LAB — CBC
HCT: 32.4 % — ABNORMAL LOW (ref 39.0–52.0)
Hemoglobin: 11.2 g/dL — ABNORMAL LOW (ref 13.0–17.0)
Platelets: 128 10*3/uL — ABNORMAL LOW (ref 150–400)
RBC: 3.44 MIL/uL — ABNORMAL LOW (ref 4.22–5.81)
RDW: 16.1 % — ABNORMAL HIGH (ref 11.5–15.5)
WBC: 5.9 10*3/uL (ref 4.0–10.5)

## 2011-02-18 LAB — DIFFERENTIAL
Basophils Absolute: 0 10*3/uL (ref 0.0–0.1)
Basophils Relative: 0 % (ref 0–1)
Eosinophils Absolute: 0 10*3/uL (ref 0.0–0.7)
Eosinophils Absolute: 0 10*3/uL (ref 0.0–0.7)
Eosinophils Relative: 0 % (ref 0–5)
Lymphs Abs: 0.4 10*3/uL — ABNORMAL LOW (ref 0.7–4.0)
Monocytes Absolute: 0.5 10*3/uL (ref 0.1–1.0)
Monocytes Absolute: 0.5 10*3/uL (ref 0.1–1.0)
Monocytes Relative: 10 % (ref 3–12)
Monocytes Relative: 9 % (ref 3–12)
Neutro Abs: 4.2 10*3/uL (ref 1.7–7.7)
Neutrophils Relative %: 80 % — ABNORMAL HIGH (ref 43–77)
Neutrophils Relative %: 84 % — ABNORMAL HIGH (ref 43–77)

## 2011-02-18 LAB — CBC
Hemoglobin: 12.8 g/dL — ABNORMAL LOW (ref 13.0–17.0)
MCHC: 35 g/dL (ref 30.0–36.0)
MCHC: 35.3 g/dL (ref 30.0–36.0)
MCV: 95.2 fL (ref 78.0–100.0)
RBC: 3.85 MIL/uL — ABNORMAL LOW (ref 4.22–5.81)
RDW: 15.5 % (ref 11.5–15.5)
RDW: 15.5 % (ref 11.5–15.5)

## 2011-02-18 LAB — COMPREHENSIVE METABOLIC PANEL
ALT: 111 U/L — ABNORMAL HIGH (ref 0–53)
AST: 36 U/L (ref 0–37)
Albumin: 2.7 g/dL — ABNORMAL LOW (ref 3.5–5.2)
Alkaline Phosphatase: 64 U/L (ref 39–117)
Calcium: 8.7 mg/dL (ref 8.4–10.5)
GFR calc Af Amer: >60 mL/min (ref >60)
Potassium: 3.5 mEq/L (ref 3.5–5.1)
Sodium: 136 mEq/L (ref 135–145)
Total Protein: 5.2 g/dL — ABNORMAL LOW (ref 6.0–8.3)

## 2011-02-18 LAB — PROTEIN ELECTROPH W RFLX QUANT IMMUNOGLOBULINS
Alpha-1-Globulin: 4.5 % (ref 2.9–4.9)
Gamma Globulin: 17 % (ref 11.1–18.8)
Total Protein ELP: 5.5 g/dL — ABNORMAL LOW (ref 6.0–8.3)

## 2011-02-18 LAB — IGG, IGA, IGM: IgG (Immunoglobin G), Serum: 1030 mg/dL (ref 694–1618)

## 2011-02-19 LAB — IMMUNOFIXATION ELECTROPHORESIS
IgA: 23 mg/dL — ABNORMAL LOW (ref 68–378)
IgG (Immunoglobin G), Serum: 3960 mg/dL — ABNORMAL HIGH (ref 694–1618)
IgM, Serum: 31 mg/dL — ABNORMAL LOW (ref 60–263)
Total Protein ELP: 8.5 g/dL — ABNORMAL HIGH (ref 6.0–8.3)

## 2011-02-19 LAB — CBC
HCT: 34.5 % — ABNORMAL LOW (ref 39.0–52.0)
Hemoglobin: 11.7 g/dL — ABNORMAL LOW (ref 13.0–17.0)
Hemoglobin: 11.5 g/dL — ABNORMAL LOW (ref 13.0–17.0)
MCHC: 34 g/dL (ref 30.0–36.0)
MCHC: 36 g/dL (ref 30.0–36.0)
MCV: 93.3 fL (ref 78.0–100.0)
RBC: 3.64 MIL/uL — ABNORMAL LOW (ref 4.22–5.81)
RBC: 3.44 MIL/uL — ABNORMAL LOW (ref 4.22–5.81)
RDW: 15.4 % (ref 11.5–15.5)

## 2011-02-19 LAB — PROTEIN ELECTROPH W RFLX QUANT IMMUNOGLOBULINS
Albumin ELP: 46.7 % — ABNORMAL LOW (ref 55.8–66.1)
Alpha-2-Globulin: 8 % (ref 7.1–11.8)
Beta Globulin: 5 % (ref 4.7–7.2)
Total Protein ELP: 8.6 g/dL — ABNORMAL HIGH (ref 6.0–8.3)

## 2011-02-19 LAB — COMPREHENSIVE METABOLIC PANEL
CO2: 26 mEq/L (ref 19–32)
Calcium: 9 mg/dL (ref 8.4–10.5)
Creatinine, Ser: 0.85 mg/dL (ref 0.4–1.5)
GFR calc Af Amer: >60 mL/min (ref >60)
GFR calc non Af Amer: >60 mL/min (ref >60)
Glucose, Bld: 96 mg/dL (ref 70–99)

## 2011-02-19 LAB — DIFFERENTIAL
Lymphocytes Relative: 28 % (ref 12–46)
Lymphs Abs: 0.8 10*3/uL (ref 0.7–4.0)
Neutrophils Relative %: 62 % (ref 43–77)

## 2011-02-20 LAB — DIFFERENTIAL
Basophils Absolute: 0 10*3/uL (ref 0.0–0.1)
Basophils Absolute: 0 10*3/uL (ref 0.0–0.1)
Basophils Relative: 0 % (ref 0–1)
Basophils Relative: 1 % (ref 0–1)
Eosinophils Absolute: 0.1 10*3/uL (ref 0.0–0.7)
Eosinophils Absolute: 0.1 10*3/uL (ref 0.0–0.7)
Eosinophils Relative: 2 % (ref 0–5)
Monocytes Absolute: 0.2 10*3/uL (ref 0.1–1.0)
Monocytes Absolute: 0.2 10*3/uL (ref 0.1–1.0)
Monocytes Relative: 8 % (ref 3–12)
Monocytes Relative: 9 % (ref 3–12)
Neutro Abs: 1.5 10*3/uL — ABNORMAL LOW (ref 1.7–7.7)
Neutro Abs: 1.5 10*3/uL — ABNORMAL LOW (ref 1.7–7.7)
Neutrophils Relative %: 52 % (ref 43–77)

## 2011-02-20 LAB — COMPREHENSIVE METABOLIC PANEL
ALT: 28 U/L (ref 0–53)
Albumin: 3.4 g/dL — ABNORMAL LOW (ref 3.5–5.2)
Alkaline Phosphatase: 79 U/L (ref 39–117)
BUN: 8 mg/dL (ref 6–23)
Chloride: 104 mEq/L (ref 96–112)
Potassium: 3.6 mEq/L (ref 3.5–5.1)
Sodium: 134 mEq/L — ABNORMAL LOW (ref 135–145)
Total Bilirubin: 0.4 mg/dL (ref 0.3–1.2)
Total Protein: 9.1 g/dL — ABNORMAL HIGH (ref 6.0–8.3)

## 2011-02-20 LAB — PROTEIN ELECTROPH W RFLX QUANT IMMUNOGLOBULINS
Albumin ELP: 44.5 % — ABNORMAL LOW (ref 55.8–66.1)
Alpha-1-Globulin: 5.4 % — ABNORMAL HIGH (ref 2.9–4.9)
Alpha-2-Globulin: 8.3 % (ref 7.1–11.8)
Gamma Globulin: 35.6 % — ABNORMAL HIGH (ref 11.1–18.8)
Total Protein ELP: 8.8 g/dL — ABNORMAL HIGH (ref 6.0–8.3)

## 2011-02-20 LAB — CBC
HCT: 34.4 % — ABNORMAL LOW (ref 39.0–52.0)
Hemoglobin: 11.9 g/dL — ABNORMAL LOW (ref 13.0–17.0)
MCHC: 34.9 g/dL (ref 30.0–36.0)
MCV: 92.8 fL (ref 78.0–100.0)
Platelets: 172 10*3/uL (ref 150–400)
RBC: 3.4 MIL/uL — ABNORMAL LOW (ref 4.22–5.81)
RDW: 15.1 % (ref 11.5–15.5)
WBC: 2.6 10*3/uL — ABNORMAL LOW (ref 4.0–10.5)

## 2011-02-20 LAB — IMMUNOFIXATION ELECTROPHORESIS
IgA: 24 mg/dL — ABNORMAL LOW (ref 68–378)
IgG (Immunoglobin G), Serum: 4240 mg/dL — ABNORMAL HIGH (ref 694–1618)
IgM, Serum: 32 mg/dL — ABNORMAL LOW (ref 60–263)

## 2011-02-20 LAB — BONE MARROW EXAM

## 2011-02-20 LAB — IGG, IGA, IGM: IgG (Immunoglobin G), Serum: 4120 mg/dL — ABNORMAL HIGH (ref 694–1618)

## 2011-02-20 LAB — CHROMOSOME ANALYSIS, BONE MARROW

## 2011-02-21 ENCOUNTER — Encounter (HOSPITAL_COMMUNITY): Payer: Medicare Other | Attending: Oncology

## 2011-02-21 DIAGNOSIS — C9000 Multiple myeloma not having achieved remission: Secondary | ICD-10-CM

## 2011-02-21 DIAGNOSIS — Z79899 Other long term (current) drug therapy: Secondary | ICD-10-CM | POA: Insufficient documentation

## 2011-02-21 DIAGNOSIS — C8589 Other specified types of non-Hodgkin lymphoma, extranodal and solid organ sites: Secondary | ICD-10-CM | POA: Insufficient documentation

## 2011-02-22 ENCOUNTER — Encounter (HOSPITAL_COMMUNITY): Payer: Medicare Other

## 2011-02-22 DIAGNOSIS — C9000 Multiple myeloma not having achieved remission: Secondary | ICD-10-CM

## 2011-03-20 ENCOUNTER — Other Ambulatory Visit (HOSPITAL_COMMUNITY): Payer: Self-pay | Admitting: Oncology

## 2011-03-20 ENCOUNTER — Encounter (HOSPITAL_COMMUNITY): Payer: Medicare Other | Attending: Oncology

## 2011-03-20 DIAGNOSIS — C9000 Multiple myeloma not having achieved remission: Secondary | ICD-10-CM

## 2011-03-20 DIAGNOSIS — Z79899 Other long term (current) drug therapy: Secondary | ICD-10-CM | POA: Insufficient documentation

## 2011-03-20 DIAGNOSIS — C8589 Other specified types of non-Hodgkin lymphoma, extranodal and solid organ sites: Secondary | ICD-10-CM | POA: Insufficient documentation

## 2011-03-20 LAB — CBC
MCHC: 32.9 g/dL (ref 30.0–36.0)
RDW: 16.4 % — ABNORMAL HIGH (ref 11.5–15.5)
WBC: 3.1 10*3/uL — ABNORMAL LOW (ref 4.0–10.5)

## 2011-03-20 LAB — COMPREHENSIVE METABOLIC PANEL
ALT: 22 U/L (ref 0–53)
AST: 25 U/L (ref 0–37)
Albumin: 3.4 g/dL — ABNORMAL LOW (ref 3.5–5.2)
Calcium: 8.9 mg/dL (ref 8.4–10.5)
GFR calc Af Amer: >60 mL/min (ref >60)
Potassium: 4.2 mEq/L (ref 3.5–5.1)
Sodium: 139 mEq/L (ref 135–145)
Total Protein: 5.9 g/dL — ABNORMAL LOW (ref 6.0–8.3)

## 2011-03-20 LAB — DIFFERENTIAL
Basophils Absolute: 0.1 10*3/uL (ref 0.0–0.1)
Basophils Relative: 2 % — ABNORMAL HIGH (ref 0–1)
Eosinophils Relative: 5 % (ref 0–5)
Monocytes Absolute: 0.4 10*3/uL (ref 0.1–1.0)
Neutro Abs: 0.9 10*3/uL — ABNORMAL LOW (ref 1.7–7.7)

## 2011-03-21 ENCOUNTER — Encounter (HOSPITAL_COMMUNITY): Payer: Medicare Other | Attending: Oncology

## 2011-03-21 DIAGNOSIS — C9000 Multiple myeloma not having achieved remission: Secondary | ICD-10-CM

## 2011-03-25 LAB — MULTIPLE MYELOMA PANEL, SERUM
Albumin ELP: 57.2 % (ref 55.8–66.1)
Alpha-1-Globulin: 5.5 % — ABNORMAL HIGH (ref 2.9–4.9)
Alpha-2-Globulin: 10.1 % (ref 7.1–11.8)
Beta 2: 4.3 % (ref 3.2–6.5)
Beta Globulin: 6.4 % (ref 4.7–7.2)
IgG (Immunoglobin G), Serum: 1050 mg/dL (ref 694–1618)

## 2011-03-26 NOTE — Assessment & Plan Note (Signed)
OFFICE VISIT   Jeremy Parrish, Jeremy Parrish  DOB:  September 10, 1941                                        October 04, 2008  CHART #:  [id number]   The patient returns today.  After his Port-A-Cath removal, his blood  pressure was 145/84, pulse 62, respirations 18, and sats were 96%.  His  incision was well healed and now we will see him back again if he has  any future problems.   Ines Bloomer, M.D.  Electronically Signed   DPB/MEDQ  D:  10/04/2008  T:  10/04/2008  Job:  [id number]

## 2011-03-26 NOTE — Op Note (Signed)
NAMEJAYMES, HANG               ACCOUNT NO.:  [id number]   MEDICAL RECORD NO.:  [id number]          PATIENT TYPE:  AMB   LOCATION:  SDS                          FACILITY:  [facility]   PHYSICIAN:  Ines Bloomer, M.D. DATE OF BIRTH:  July 06, 1941   DATE OF PROCEDURE:  DATE OF DISCHARGE:  10/03/2008                               OPERATIVE REPORT   PREOPERATIVE DIAGNOSIS:  Status post chemotherapy for non-Hodgkin  lymphoma.   POSTOPERATIVE DIAGNOSIS:  Status post chemotherapy for non-Hodgkin  lymphoma.   OPERATION PERFORMED:  Removal of right internal jugular Port-a-Cath.   SURGEON:  Ines Bloomer, MD   ANESTHESIA:  Xylocaine 1% with IV sedation.   DESCRIPTION OF PROCEDURE:  After prepping and draping, the right chest  area was infiltrated with 1% Xylocaine over the previous scar and a  transverse incision was made and dissection was carried down through the  Port-a-Cath.  The capsule of the Port-a-Cath was opened.  The stitch  holding the Port-a-Cath in place was divided with a knife and removed  and then the tubing was dissected out and removed and then the Port-a-  Cath was removed.  The wound was closed with 3-0 Vicryl and Dermabond  for the skin.      Ines Bloomer, M.D.  Electronically Signed     DPB/MEDQ  D:  10/03/2008  T:  10/03/2008  Job:  [id number]

## 2011-03-29 NOTE — Cardiovascular Report (Signed)
[facility]  Patient:    Jeremy Parrish, Jeremy Parrish                      MRN: [id number] Adm. Date:  [id number] Attending:  Berry, Jonathan [place]az[place] CC:         [facility]  Loran Senters, M.D., [facility], [place], [place]  [facility], [address] [place] [place], [place] [address]                        Cardiac Catheterization  PROCEDURE:  Cardiac catheterization/percutaneous transluminal coronary angioplasty and stent placement.  INDICATION:  Jeremy Parrish is a 70 year old male who has a history of circumflex and RCA stenting in 1996 and 1997.  He was re-catheterized last in 1998 and found to have patent stents.  His other problems include hyperlipidemia.  He has been complaining of progressive dyspnea, arm, back and chest pain. Cardiolite stress test revealed inferolateral scar with some moderate amount of superimposed ischemia.  He presents now for diagnostic coronary arteriography.  DESCRIPTION OF PROCEDURE:  Patient was brought to the second floor [facility] in the post-absorptive state.  He was premedicated with p.o. Valium and he received IV Versed and Nubain in the cath lab.  His right groin was shaved and prepped in the usual sterile fashion.  Xylocaine 1% was used for local anesthesia.  A 6-French sheath was inserted into the right femoral artery using standard Seldinger technique.  The 6-French right and left Judkins diagnostic catheters along with a 6-French pigtail catheter were used for selective coronary angiography, left ventriculography and distal abdominal aortography.  Omnipaque dye was used for the entirety of the diagnostic case. Retrograde aortic, left ventricular and pullback pressures were recorded.  HEMODYNAMICS 1. Aortic systolic pressure 124; diastolic pressure 69. 2. Left ventricular systolic pressure 126 and diastolic pressure 22.  SELECTIVE CORONARY  ANGIOGRAPHY 1. Left main:  Normal. 2. LAD:  The LAD had approximately 30-50% segmental proximal stenosis. 3. Left circumflex:  Left circumflex gave off a high marginal branch in the    ramus distribution which was moderate in size and normal.  The    mid-circumflex stents were widely patent, with a focal area in the    midportion which was at most 40-50% stenosed. 4. Right coronary artery:  This is a dominant vessel that is widely patent.    The previously placed proximal overlapping PS-1530 stents had approximately    40-50% in-stent diffuse restenosis.  There was a segmental area just distal    to the stents which was approximately 60% stenosed and hypodense.  LEFT VENTRICULOGRAPHY:  RAO left ventriculograms were performed using 20 cc of Omnipaque dye at 10 cc/sec.  The overall LVEF was estimated at approximately 50%.  DISTAL ABDOMINAL AORTOGRAPHY:  Distal abdominal aortogram was performed using 20 cc of Omnipaque dye at 20 cc/sec placed.  The renal arteries were widely patent.  The infrarenal abdominal aorta and iliac bifurcation appeared free of significant atherosclerotic changes.  IMPRESSION:  Jeremy Parrish has a suspicious area in his mid right coronary artery just distal to the stented segment, with some moderate in-stent restenosis. We will proceed with intravascular ultrasound-guided percutaneous intervention.  DESCRIPTION OF PROCEDURE:  The existing 6-French in the right femoral artery was upgraded to a 7-French sheath.  A 6-French sheath was then inserted into the right femoral vein.  A 7-French JR4 guiding  catheter with sideholes, along with an ______ support wire and a 3.2-French IVUS catheter were used.  Four thousand units of heparin were administered and Hexabrix dye was used for the intervention.  Retrograde aortic pressures were monitored during the case. The patient did become bradycardic and hypotensive, requiring 1.5 mg of atropine intravenously followed by  intravenous fluid resuscitation and dopamine at 5 mcg/kg per minute, which ultimately resulted in improvement in his clinical status.  IVUS catheter revealed a distal reference segment of 3.8 mm, with a culprit segment measuring less than 2 mm and a moderate amount of ______-intimal hyperplasia within the stented segment.  Patient received Integrilin double-bolus infusion.  He had been on aspirin and Plavix.  Pre-dilatation was performed with a 3.0 15 Quantum Ranger.  Stenting was performed with a 3.5 x 15.0 S7 at 12 atmospheres.  PTCA was performed with the Medtronic balloon within the previously stented segment, resulting in reduction of 60% segmental hypodense area and moderate in-stent restenosis to 0% residual.  The patient tolerated the procedure well.  The ending ACT was 260.  OVERALL IMPRESSION:  Successful intravascular ultrasound-guided percutaneous transluminal coronary angioplasty and stenting of dominant mid and proximal right coronary artery.  Sheaths were sewn securely in place.  The guide catheter wires were removed.  The patient left the lab in stable condition. Plans will be discontinue the heparin and remove the sheaths once the activated clotting time falls below 150.  Integrilin will be continued for 18 hours.  Patient will be discharged home in the morning on aspirin and Plavix, if he remains clinically stable overnight.  Dr. Lubertha South in [place] was notified of these results.  The patient left the lab in stable condition. DD:  06/24/00 TD:  06/24/00 Job: [id number]

## 2011-03-29 NOTE — Procedures (Signed)
NAME:  Jeremy Parrish, Jeremy Parrish NO.:  [id number]   MEDICAL RECORD NO.:  [id number]           PATIENT TYPE:  REC   LOCATION:                                FACILITY:  [facility]   PHYSICIAN:  Dani Gobble, MD       DATE OF BIRTH:  [birth year]   DATE OF PROCEDURE:  09/26/2006  DATE OF DISCHARGE:                                  ECHOCARDIOGRAM   REFERRING:  Ladona Horns. Mariel Sleet, MD and Nanetta Batty, M.D.   INDICATIONS:  A 70 year old gentleman is referred for echocardiogram, status  post chemotherapy.   FINDINGS:  1. The aorta measures normal at 3.7 cm.  2. The left atrium is moderate markedly dilated; it measured at 5.4 cm.  3. The intraventricular septum is thickened at 1.4 cm, while the posterior      wall is within normal limits at 1.1 cm.  4. The aortic valve is structurally normal with trace-to-mild aortic      insufficiency.  Doppler interrogation aortic valve is within normal      limits.  5. 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  There is moderate mitral regurgitation noted.  Doppler      interrogation of mitral valve is within normal limits.  6. The pulmonic valve reveals mild to moderate eccentric pulmonic      insufficiency.  7. Tricuspid valve reveals mild-to-moderate tricuspid regurgitation with      an estimated RVSP of approximately 40 mmHg.  8. The left ventricle measures normally with the LVIDD measured at 5.3 cm      and the LVIC measured at 4.1 cm.  Overall ejection fraction is normal.      There is severe posterior hypokinesis to akinesis.  9. The right atrium and right ventricle appear reasonably normal in size      and right ventricular systolic function appears normal.   IMPRESSION:  1. Moderate-to-marked left atrial enlargement.  2. Mild ASH.  3. Trace-to-mild aortic insufficiency.  4. 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  5.  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  6. Mild-to-moderate tricuspid regurgitation with mild pulmonary      hypertension.  7. Normal left ventricular size with normal.  Overall ejection fraction.      There is severe posterior is severe posterior hypokinesis to akinesis      as compared to prior study in October of this year.  The posterior wall      motion abnormality appears to be new, otherwise no significant change.           ______________________________  Dani Gobble, MD     AB/MEDQ  D:  09/30/2006  T:  10/01/2006  Job:  [id number]   cc:   Ladona Horns. Mariel Sleet, MD  Fax: [phone]   Nanetta Batty, M.D.  Fax: [phone]

## 2011-03-29 NOTE — Discharge Summary (Signed)
Jeremy Parrish, Jeremy Parrish NO.:  [id number]   MEDICAL RECORD NO.:  [id number]          PATIENT TYPE:  INP   LOCATION:  1607                         FACILITY:  [facility]   PHYSICIAN:  Sharlet Salina T. Hoxworth, M.D.DATE OF BIRTH:  01-22-1941   DATE OF ADMISSION:  10/13/2006  DATE OF DISCHARGE:  10/24/2006                               DISCHARGE SUMMARY   DISCHARGE DIAGNOSES:  1. Diverticulitis with colovesical fistula.  2. Postoperative ileus versus partial small-bowel obstruction.   OPERATIONS AND PROCEDURES:  Sigmoid colectomy with anastomosis and  repair of colovesical fistula on October 14, 2006 by Dr. Johna Sheriff and  Dr. Aldean Ast.   HISTORY OF PRESENT ILLNESS:  Jeremy Parrish is a 70 year old male followed  by Dr. 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  He is currently undergoing  chemotherapy with good response clinically.  Noted on routine scans in  follow-up for this condition was air in the bladder as well as a  localized area of sigmoid diverticulitis consistent with a colovesical  fistula.  On questioning, the patient has had pneumaturia although no  abdominal pain or change in bowel habits.  The patient has been  evaluated in the office and now is admitted for elective sigmoid  colectomy with repair of his colovesical fistula.   PAST MEDICAL HISTORY:  As above.  Has history of myocardial infarction.  History of prostate cancer.   SOCIAL HISTORY, FAMILY HISTORY, REVIEW OF SYSTEMS:  See detailed H&P.   PERTINENT PHYSICAL EXAMINATION:  Recently healthy-appearing male, normal  weight, no acute distress.  ABDOMEN:  Soft, nontender, no masses or organomegaly.   HOSPITAL COURSE:  Following mechanical antibiotic bowel prep at home,  the patient was admitted on October 13, 2006 and underwent an uneventful  sigmoid colectomy with anastomosis and bladder repair.  Postoperatively,  his initial course was  unremarkable.  He was put on a clear liquid diet  on the first postoperative day with a soft abdomen and no nausea.  By  the second postoperative day, however, he had significant nausea and  some mild abdominal distension.  Diet was restricted to sips of water  only.  He continued to, however, have evidence of ileus over the next  several days with persistent nausea, reflux and abdominal distension.  He did have several bowel movements on the fourth postoperative day and  felt significantly better, but again two days later developed bloating,  nausea and abdominal distension.  KUB at that time was consistent with a  persistent ileus versus partial small bowel obstruction.  More likely  appeared consistent with bowel obstruction.  At this point, NG tube was  placed with resolution of his nausea.  It did have high output.  By the  8th postoperative day, December 11, he was passing flatus, and abdomen  was less distended.  On the 9th postoperative day, KUB had resolved to  essentially normal appearance of his bowel, and he had a bowel movement,  and the NG tube was removed.  He did well from this point with continued  bowel movements.  No further nausea and was advanced to regular diet  without difficulty.  He was discharged on December 14.  At this time,  his Foley was removed just prior to discharge.  Also noted prior to  discharge was some dysuria, and urinalysis culture showed a UTI, and he  was started on Cipro.  This will be continued for one week at home.  Wound is healing primarily without infection.  Follow-up is to be my  office in one week.  Final pathology confirmed diverticulitis and  fistula tract.      Lorne Skeens. Hoxworth, M.D.  Electronically Signed     BTH/MEDQ  D:  11/17/2006  T:  11/17/2006  Job:  [id number]   cc:   Ladona Horns. Mariel Sleet, MD  Fax: [phone]   Courtney Paris, M.D.  Fax: [phone]

## 2011-03-29 NOTE — H&P (Signed)
[facility]  Patient:    Jeremy Parrish, Jeremy Parrish                      MRN: [id number] Adm. 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  He had a biopsy April 23, 2001 that showed 10% cancer on the right 3+3 Gleason score and 20% on the left Gleason 3+4.  He had had three previous biopsies in 1998, 1999, and 2001 all done by Dr. Rito Ehrlich in [place].  All were negative.  He subsequently had a TARGIS procedure July 2001 for obstructive uropathy but because of a persisted elevated PSA of 6. with 10% free PSA he had the biopsy in June.  He has a 20 g prostate.  He understands the increased risk of incontinence, impotence, rectal injury with surgery versus radiation but he auto donated 2 units of blood and had a mechanical and antibiotic bowel prep yesterday.  He had cardiac clearance by Dr. Allyson Sabal and enters now for surgery understanding complications including, but not limited to, incontinence, impotence, blood loss, deep vein thrombosis, pulmonary emboli, and death.  MEDICATIONS: 1. Norvasc 10 mg q.d. 2. Zocor 10 mg q.d. 3. He is off his Plavix and aspirin for over a week.  ALLERGIES:  None.  PROCEDURE:  He has had six cardiac stents three separate times.  He had two after his first MI in January 1996 and two stents after some more angina in August 1997 and two more stents August 2001.  He does not have angina.  He does not smoke.  No GI complaints.  He does have a history of kidney stone over 10 years not recovered or recurrent.  No UTI or prostatitis by history.  SOCIAL HISTORY:  He is married.  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  FAMILY HISTORY:  Strong family history for heart disease.  He had a brother who  had CABG and one sister who died of an MI.  He has another sister with Alzheimers.  He has two daughters and one son.  Negative family history for cancer of the prostate or diabetes.  PHYSICAL EXAMINATION  VITAL SIGNS:  Temperature afebrile, weight 187 pounds, pulse 69, blood pressure 123/75.  GENERAL:  He is a short, nervous white male with brown hair and a moustache.  HEENT:  Clear.  CHEST:  Benign without murmurs, gallops, or rubs.  ABDOMEN:  Soft, benign without masses or tenderness.  Liver, spleen are nonpalpable.  Bladder is not distended.  GENITOURINARY:  Circumcised penis with adequate meatus.  No lesions.  Normal scrotum.  Testes, epididymes are nontender.  RECTAL:  Prostate is about 20 g, slightly firm, but not fixed or indurated. SV is not palpable.  EXTREMITIES:  No edema.  Good distal pulses.  IMPRESSION: 1. T2 A Gleason 3+4 adenocarcinoma of the prostate bilateral. 2. Elevated PSA. 3. Coronary artery disease with myocardial infarction and history of stents x    6. 4. Previous TARGIS procedure (microwave) for obstructive uropathy.  PLAN:  Radical retropubic prostatectomy and bilateral lymph node dissection as discussed. DD:  06/24/01 TD:  06/24/01 Job: [id number]

## 2011-04-09 ENCOUNTER — Ambulatory Visit (HOSPITAL_COMMUNITY): Payer: Medicare Other | Admitting: Oncology

## 2011-04-10 ENCOUNTER — Other Ambulatory Visit (HOSPITAL_COMMUNITY): Payer: Self-pay | Admitting: Oncology

## 2011-04-10 ENCOUNTER — Encounter (HOSPITAL_COMMUNITY): Payer: Medicare Other | Admitting: Oncology

## 2011-04-10 DIAGNOSIS — C8589 Other specified types of non-Hodgkin lymphoma, extranodal and solid organ sites: Secondary | ICD-10-CM

## 2011-04-10 DIAGNOSIS — C9000 Multiple myeloma not having achieved remission: Secondary | ICD-10-CM

## 2011-04-10 LAB — DIFFERENTIAL
Basophils Relative: 1 % (ref 0–1)
Lymphocytes Relative: 53 % — ABNORMAL HIGH (ref 12–46)
Lymphs Abs: 1.9 10*3/uL (ref 0.7–4.0)
Monocytes Relative: 14 % — ABNORMAL HIGH (ref 3–12)
Neutro Abs: 1 10*3/uL — ABNORMAL LOW (ref 1.7–7.7)
Neutrophils Relative %: 29 % — ABNORMAL LOW (ref 43–77)

## 2011-04-10 LAB — COMPREHENSIVE METABOLIC PANEL
ALT: 24 U/L (ref 0–53)
AST: 33 U/L (ref 0–37)
CO2: 24 mEq/L (ref 19–32)
Chloride: 105 mEq/L (ref 96–112)
Creatinine, Ser: 0.89 mg/dL (ref 0.4–1.5)
GFR calc Af Amer: >60 mL/min (ref >60)
GFR calc non Af Amer: >60 mL/min (ref >60)
Total Bilirubin: 0.4 mg/dL (ref 0.3–1.2)

## 2011-04-10 LAB — CBC
Hemoglobin: 11.8 g/dL — ABNORMAL LOW (ref 13.0–17.0)
MCH: 30.3 pg (ref 26.0–34.0)
RBC: 3.9 MIL/uL — ABNORMAL LOW (ref 4.22–5.81)

## 2011-04-17 ENCOUNTER — Other Ambulatory Visit (HOSPITAL_COMMUNITY): Payer: Self-pay | Admitting: Oncology

## 2011-04-17 ENCOUNTER — Encounter (HOSPITAL_COMMUNITY): Payer: Medicare Other | Attending: Oncology

## 2011-04-17 DIAGNOSIS — C9000 Multiple myeloma not having achieved remission: Secondary | ICD-10-CM

## 2011-04-17 DIAGNOSIS — C8589 Other specified types of non-Hodgkin lymphoma, extranodal and solid organ sites: Secondary | ICD-10-CM | POA: Insufficient documentation

## 2011-04-17 DIAGNOSIS — Z79899 Other long term (current) drug therapy: Secondary | ICD-10-CM | POA: Insufficient documentation

## 2011-04-17 LAB — COMPREHENSIVE METABOLIC PANEL
ALT: 21 U/L (ref 0–53)
BUN: 16 mg/dL (ref 6–23)
CO2: 27 mEq/L (ref 19–32)
Calcium: 10.2 mg/dL (ref 8.4–10.5)
Creatinine, Ser: 0.94 mg/dL (ref 0.4–1.5)
GFR calc non Af Amer: >60 mL/min (ref >60)
Glucose, Bld: 79 mg/dL (ref 70–99)
Total Protein: 6.2 g/dL (ref 6.0–8.3)

## 2011-04-17 LAB — DIFFERENTIAL
Basophils Relative: 2 % — ABNORMAL HIGH (ref 0–1)
Eosinophils Absolute: 0.2 10*3/uL (ref 0.0–0.7)
Eosinophils Relative: 6 % — ABNORMAL HIGH (ref 0–5)
Lymphs Abs: 1.5 10*3/uL (ref 0.7–4.0)
Monocytes Absolute: 0.3 10*3/uL (ref 0.1–1.0)
Monocytes Relative: 8 % (ref 3–12)
Neutrophils Relative %: 37 % — ABNORMAL LOW (ref 43–77)

## 2011-04-17 LAB — CBC
HCT: 35.9 % — ABNORMAL LOW (ref 39.0–52.0)
Hemoglobin: 11.7 g/dL — ABNORMAL LOW (ref 13.0–17.0)
MCH: 29.8 pg (ref 26.0–34.0)
MCHC: 32.6 g/dL (ref 30.0–36.0)
MCV: 91.6 fL (ref 78.0–100.0)
RBC: 3.92 MIL/uL — ABNORMAL LOW (ref 4.22–5.81)

## 2011-04-18 ENCOUNTER — Encounter (HOSPITAL_COMMUNITY): Payer: Medicare Other

## 2011-04-18 ENCOUNTER — Other Ambulatory Visit (HOSPITAL_COMMUNITY): Payer: Self-pay | Admitting: Oncology

## 2011-04-18 DIAGNOSIS — C9000 Multiple myeloma not having achieved remission: Secondary | ICD-10-CM

## 2011-04-18 LAB — KAPPA/LAMBDA LIGHT CHAINS
Kappa free light chain: 3.71 mg/dL — ABNORMAL HIGH (ref 0.33–1.94)
Lambda free light chains: 3.11 mg/dL — ABNORMAL HIGH (ref 0.57–2.63)

## 2011-04-19 LAB — MULTIPLE MYELOMA PANEL, SERUM
Albumin ELP: 58.4 % (ref 55.8–66.1)
Beta Globulin: 6.4 % (ref 4.7–7.2)
IgA: 243 mg/dL (ref 68–379)
IgM, Serum: 22 mg/dL — ABNORMAL LOW (ref 41–251)
M-Spike, %: 0.71 g/dL
Total Protein: 6.7 g/dL (ref 6.0–8.3)

## 2011-04-25 ENCOUNTER — Other Ambulatory Visit (HOSPITAL_COMMUNITY): Payer: Self-pay | Admitting: Oncology

## 2011-04-25 ENCOUNTER — Encounter (HOSPITAL_COMMUNITY): Payer: Medicare Other

## 2011-04-25 DIAGNOSIS — C8589 Other specified types of non-Hodgkin lymphoma, extranodal and solid organ sites: Secondary | ICD-10-CM

## 2011-04-25 LAB — CBC
MCHC: 33.2 g/dL (ref 30.0–36.0)
RDW: 15.8 % — ABNORMAL HIGH (ref 11.5–15.5)

## 2011-04-25 LAB — DIFFERENTIAL
Basophils Absolute: 0.1 10*3/uL (ref 0.0–0.1)
Basophils Relative: 2 % — ABNORMAL HIGH (ref 0–1)
Eosinophils Absolute: 0 10*3/uL (ref 0.0–0.7)
Neutro Abs: 2.3 10*3/uL (ref 1.7–7.7)
Neutrophils Relative %: 44 % (ref 43–77)

## 2011-04-29 ENCOUNTER — Encounter (HOSPITAL_COMMUNITY): Payer: Self-pay

## 2011-04-29 ENCOUNTER — Encounter (HOSPITAL_COMMUNITY)
Admission: RE | Admit: 2011-04-29 | Discharge: 2011-04-29 | Disposition: A | Discharge status: Home / Self Care | Payer: Medicare Other | Source: Ambulatory Visit | Attending: Oncology | Admitting: Oncology

## 2011-04-29 DIAGNOSIS — C8589 Other specified types of non-Hodgkin lymphoma, extranodal and solid organ sites: Secondary | ICD-10-CM | POA: Insufficient documentation

## 2011-04-29 DIAGNOSIS — N281 Cyst of kidney, acquired: Secondary | ICD-10-CM | POA: Insufficient documentation

## 2011-04-29 DIAGNOSIS — C8590 Non-Hodgkin lymphoma, unspecified, unspecified site: Secondary | ICD-10-CM

## 2011-04-29 HISTORY — DX: Malignant (primary) neoplasm, unspecified: C80.1

## 2011-04-29 MED ORDER — FLUDEOXYGLUCOSE F - 18 (FDG) INJECTION
16.9000 | Freq: Once | INTRAVENOUS | Status: AC | PRN
  Administered 2011-04-29: 16.9 via INTRAVENOUS

## 2011-05-14 ENCOUNTER — Other Ambulatory Visit (HOSPITAL_COMMUNITY): Payer: Self-pay | Admitting: Oncology

## 2011-05-14 ENCOUNTER — Encounter (HOSPITAL_COMMUNITY): Payer: Medicare Other | Attending: Oncology

## 2011-05-14 DIAGNOSIS — C9000 Multiple myeloma not having achieved remission: Secondary | ICD-10-CM | POA: Insufficient documentation

## 2011-05-14 DIAGNOSIS — C8589 Other specified types of non-Hodgkin lymphoma, extranodal and solid organ sites: Secondary | ICD-10-CM | POA: Insufficient documentation

## 2011-05-14 DIAGNOSIS — Z79899 Other long term (current) drug therapy: Secondary | ICD-10-CM | POA: Insufficient documentation

## 2011-05-14 LAB — DIFFERENTIAL
Basophils Relative: 1 % (ref 0–1)
Eosinophils Absolute: 0.2 10*3/uL (ref 0.0–0.7)
Eosinophils Relative: 4 % (ref 0–5)
Lymphs Abs: 1.7 10*3/uL (ref 0.7–4.0)
Monocytes Absolute: 0.4 10*3/uL (ref 0.1–1.0)
Monocytes Relative: 10 % (ref 3–12)
Neutrophils Relative %: 45 % (ref 43–77)

## 2011-05-14 LAB — COMPREHENSIVE METABOLIC PANEL
AST: 28 U/L (ref 0–37)
CO2: 26 mEq/L (ref 19–32)
Calcium: 9.3 mg/dL (ref 8.4–10.5)
Creatinine, Ser: 1.01 mg/dL (ref 0.50–1.35)
GFR calc Af Amer: >60 mL/min (ref >60)
GFR calc non Af Amer: >60 mL/min (ref >60)
Sodium: 136 mEq/L (ref 135–145)
Total Protein: 7.3 g/dL (ref 6.0–8.3)

## 2011-05-14 LAB — CBC
MCH: 30.7 pg (ref 26.0–34.0)
MCHC: 33.3 g/dL (ref 30.0–36.0)
MCV: 92 fL (ref 78.0–100.0)
Platelets: 127 10*3/uL — ABNORMAL LOW (ref 150–400)
RBC: 4.01 MIL/uL — ABNORMAL LOW (ref 4.22–5.81)
RDW: 16.1 % — ABNORMAL HIGH (ref 11.5–15.5)
WBC: 4.2 10*3/uL (ref 4.0–10.5)

## 2011-05-16 ENCOUNTER — Encounter (HOSPITAL_COMMUNITY): Payer: Medicare Other

## 2011-05-16 DIAGNOSIS — C9000 Multiple myeloma not having achieved remission: Secondary | ICD-10-CM

## 2011-05-16 LAB — KAPPA/LAMBDA LIGHT CHAINS
Kappa, lambda light chain ratio: 1.19 (ref 0.26–1.65)
Lambda free light chains: 3.15 mg/dL — ABNORMAL HIGH (ref 0.57–2.63)

## 2011-05-20 ENCOUNTER — Other Ambulatory Visit (HOSPITAL_COMMUNITY): Payer: Self-pay | Admitting: *Deleted

## 2011-05-20 LAB — MULTIPLE MYELOMA PANEL, SERUM
Beta 2: 4.4 % (ref 3.2–6.5)
Gamma Globulin: 16.3 % (ref 11.1–18.8)
IgG (Immunoglobin G), Serum: 1150 mg/dL (ref 650–1600)
M-Spike, %: 0.71 g/dL

## 2011-05-20 NOTE — Telephone Encounter (Signed)
Need a refill on  Oxycodone/Apap

## 2011-05-24 ENCOUNTER — Other Ambulatory Visit (HOSPITAL_COMMUNITY): Payer: Self-pay | Admitting: Oncology

## 2011-05-24 ENCOUNTER — Encounter (HOSPITAL_BASED_OUTPATIENT_CLINIC_OR_DEPARTMENT_OTHER): Payer: Medicare Other

## 2011-05-24 DIAGNOSIS — C9000 Multiple myeloma not having achieved remission: Secondary | ICD-10-CM

## 2011-05-24 LAB — DIFFERENTIAL
Basophils Absolute: 0 10*3/uL (ref 0.0–0.1)
Basophils Relative: 1 % (ref 0–1)
Eosinophils Relative: 3 % (ref 0–5)
Monocytes Absolute: 0.4 10*3/uL (ref 0.1–1.0)
Monocytes Relative: 7 % (ref 3–12)

## 2011-05-24 LAB — CBC
HCT: 37.2 % — ABNORMAL LOW (ref 39.0–52.0)
Hemoglobin: 12.4 g/dL — ABNORMAL LOW (ref 13.0–17.0)
MCHC: 33.3 g/dL (ref 30.0–36.0)
MCV: 92.1 fL (ref 78.0–100.0)
RDW: 15.9 % — ABNORMAL HIGH (ref 11.5–15.5)

## 2011-05-24 NOTE — Progress Notes (Signed)
Addended by: Sterling Big on: 05/24/2011 05:17 PM   Modules accepted: Level of Service

## 2011-06-10 ENCOUNTER — Encounter (HOSPITAL_COMMUNITY): Payer: Medicare Other

## 2011-06-10 LAB — COMPREHENSIVE METABOLIC PANEL
ALT: 18 U/L (ref 0–53)
AST: 24 U/L (ref 0–37)
Alkaline Phosphatase: 57 U/L (ref 39–117)
CO2: 23 mEq/L (ref 19–32)
Chloride: 108 mEq/L (ref 96–112)
Creatinine, Ser: 0.92 mg/dL (ref 0.50–1.35)
GFR calc non Af Amer: >60 mL/min (ref >60)
Potassium: 4.2 mEq/L (ref 3.5–5.1)
Total Bilirubin: 0.5 mg/dL (ref 0.3–1.2)

## 2011-06-10 LAB — DIFFERENTIAL
Basophils Absolute: 0 10*3/uL (ref 0.0–0.1)
Basophils Relative: 1 % (ref 0–1)
Eosinophils Absolute: 0.1 10*3/uL (ref 0.0–0.7)
Eosinophils Relative: 3 % (ref 0–5)
Monocytes Absolute: 0.4 10*3/uL (ref 0.1–1.0)
Monocytes Relative: 10 % (ref 3–12)

## 2011-06-10 LAB — CBC
HCT: 35.9 % — ABNORMAL LOW (ref 39.0–52.0)
Hemoglobin: 11.6 g/dL — ABNORMAL LOW (ref 13.0–17.0)
MCH: 30.9 pg (ref 26.0–34.0)
MCHC: 32.3 g/dL (ref 30.0–36.0)
RDW: 15.5 % (ref 11.5–15.5)

## 2011-06-10 NOTE — Progress Notes (Signed)
Labs drawn today for cbcdff,cmp,kllc,mm panel

## 2011-06-11 ENCOUNTER — Other Ambulatory Visit (HOSPITAL_COMMUNITY): Payer: Self-pay | Admitting: Oncology

## 2011-06-11 ENCOUNTER — Telehealth (HOSPITAL_COMMUNITY): Payer: Self-pay | Admitting: *Deleted

## 2011-06-11 DIAGNOSIS — R197 Diarrhea, unspecified: Secondary | ICD-10-CM

## 2011-06-11 MED ORDER — DIPHENOXYLATE-ATROPINE 2.5-0.025 MG PO TABS: 1.0000 | ORAL_TABLET | Freq: Four times a day (QID) | ORAL | Status: DC | PRN

## 2011-06-11 NOTE — Telephone Encounter (Signed)
Call from pt. States he has had diarrhea for several days. Reports he was using an OTC to stop diarrhea but it did not help. Found an RX for Lomotil filled back in 2007 and took apx 5 pills yesterday and has used 1 pill before breakfast today. Reports no relief. Reports he is eating and drinking well. Denies any fever,nausea or vomiting. Uses [facility] for RX.

## 2011-06-12 LAB — MULTIPLE MYELOMA PANEL, SERUM
Alpha-1-Globulin: 4.6 % (ref 2.9–4.9)
Alpha-2-Globulin: 9.8 % (ref 7.1–11.8)
Beta Globulin: 6.6 % (ref 4.7–7.2)
Gamma Globulin: 16.1 % (ref 11.1–18.8)
M-Spike, %: 0.74 g/dL

## 2011-06-13 ENCOUNTER — Encounter (HOSPITAL_COMMUNITY): Payer: Medicare Other | Attending: Oncology

## 2011-06-13 VITALS — BP 128/65 | HR 65 | Temp 97.8°F

## 2011-06-13 DIAGNOSIS — C9000 Multiple myeloma not having achieved remission: Secondary | ICD-10-CM

## 2011-06-13 MED ORDER — ZOLEDRONIC ACID 4 MG/5ML IV CONC
4.0000 mg | Freq: Once | INTRAVENOUS | Status: AC
  Administered 2011-06-13: 4 mg via INTRAVENOUS
  Filled 2011-06-13: qty 5

## 2011-06-13 MED ORDER — HEPARIN SOD (PORK) LOCK FLUSH 100 UNIT/ML IV SOLN
INTRAVENOUS | Status: AC
  Filled 2011-06-13: qty 5

## 2011-06-13 NOTE — Progress Notes (Signed)
Tolerated infusion well. 

## 2011-06-19 HISTORY — PX: US ECHOCARDIOGRAPHY: HXRAD669

## 2011-07-08 ENCOUNTER — Ambulatory Visit (HOSPITAL_COMMUNITY): Payer: Medicare Other | Admitting: Oncology

## 2011-07-16 ENCOUNTER — Encounter (HOSPITAL_COMMUNITY): Payer: Self-pay | Admitting: Oncology

## 2011-07-16 ENCOUNTER — Encounter (HOSPITAL_COMMUNITY): Payer: Medicare Other | Attending: Oncology | Admitting: Oncology

## 2011-07-16 ENCOUNTER — Other Ambulatory Visit (HOSPITAL_COMMUNITY): Payer: Medicare Other

## 2011-07-16 VITALS — BP 125/79 | HR 46 | Temp 98.5°F | Wt 190.2 lb

## 2011-07-16 DIAGNOSIS — C9000 Multiple myeloma not having achieved remission: Secondary | ICD-10-CM | POA: Insufficient documentation

## 2011-07-16 DIAGNOSIS — C8299 Follicular lymphoma, unspecified, extranodal and solid organ sites: Secondary | ICD-10-CM

## 2011-07-16 LAB — COMPREHENSIVE METABOLIC PANEL
AST: 21 U/L (ref 0–37)
Albumin: 4 g/dL (ref 3.5–5.2)
Alkaline Phosphatase: 64 U/L (ref 39–117)
BUN: 17 mg/dL (ref 6–23)
Chloride: 101 mEq/L (ref 96–112)
Potassium: 4.4 mEq/L (ref 3.5–5.1)
Sodium: 137 mEq/L (ref 135–145)
Total Bilirubin: 0.5 mg/dL (ref 0.3–1.2)
Total Protein: 6.8 g/dL (ref 6.0–8.3)

## 2011-07-16 LAB — DIFFERENTIAL
Eosinophils Absolute: 0.2 10*3/uL (ref 0.0–0.7)
Lymphs Abs: 1.1 10*3/uL (ref 0.7–4.0)
Monocytes Absolute: 0.7 10*3/uL (ref 0.1–1.0)
Monocytes Relative: 17 % — ABNORMAL HIGH (ref 3–12)
Neutro Abs: 2 10*3/uL (ref 1.7–7.7)
Neutrophils Relative %: 49 % (ref 43–77)

## 2011-07-16 LAB — CBC
HCT: 40.5 % (ref 39.0–52.0)
MCHC: 32.8 g/dL (ref 30.0–36.0)
Platelets: 135 10*3/uL — ABNORMAL LOW (ref 150–400)
RDW: 16.5 % — ABNORMAL HIGH (ref 11.5–15.5)
WBC: 4 10*3/uL (ref 4.0–10.5)

## 2011-07-16 NOTE — Progress Notes (Signed)
CC:   Jaclyn Prime. Greggory Stallion, M.D. [name], MD Charlaine Dalton Sherene Sires, MD, FCCP Katy Fitch. Sypher, M.D.  DIAGNOSES: 1. IgG lambda multiple myeloma, status post 4 cycles of dexamethasone     and Velcade followed by autologous stem cell transplant, thus far     without recurrence.  He is now on Revlimid 15 mg 7 days on and 7     days off. 2. Non-Hodgkin lymphoma stage IV, low grade B-cell type, but he was     symptomatic at the time of presentation in July 2000, treated with     R-CHOP x6 cycles with an excellent response.  His PET scan in June     was negative for any recurrence. 3. Peripheral neuropathy with minimal discomfort in the distal half of     his feet bilaterally, but much improved compared to post-transplant     days. 4. Bronchiectasis, presently on an antibiotic but his lungs are very     clear today.  No wheezing, no rales, no rhonchi, and he feels     better, he states. 5. Chronic obstructive pulmonary disease secondary to longstanding     smoking history, quit in 1995. 6. Prostate cancer, status post radical prostatectomy in 2002 by Dr.     Vic Blackbird. 7. Coronary artery disease, status post 5 stents placed. 8. Sigmoid diverticular perforation with fistula formation of the     bladder with repair by Dr. Londell Moh and Dr. Jaclynn Guarneri. 9. Osteoporosis, on therapy. 10.Bilateral Dupuytren palmar fibromatosis, for which he is seeing Dr.     Laroy Apple.  [name] is looking better and better.  His labs show an IgG kappa protein spike which is stable.  His IgG levels and IgA levels are actually improving.  IgM level still low.  He has not been recently febrile but is on this antibiotic because of some wheezing and sputum production that was purulent last week.  He feels better, actually looks very good today.  His vital signs show he is stable.  His oncologic review of systems indicates that he is going to have a pacemaker placed next week.  His pulse is only about 46-50 today.   He is not aware of any chest pain.  He is not short of breath today.  He states that yesterday was not his as good a day because of the humidity and the heat and that he was wheezing and had to use his inhaler.  He has not used any today.  Bowel function is good.  His peripheral neuropathy is much, much improved compared to post-transplant days.  His medications really have not changed other than he is on the Revlimid 15 mg, and he takes it for 7 days on and then 7 days off, and we will see if Dr. Greggory Stallion wants to change that next time he is seen, which will be in October.  Other than this, he looks great.  PHYSICAL EXAMINATION:  Blood pressure 125/79.  He is afebrile.  His pulse, again, at 46-50 and regular, respirations 16-18 and unlabored today.  He is in no acute distress.  He is afebrile.  Skin:  Warm and dry to the touch.  Lungs:  Clear actually to auscultation and percussion today.  He has no adenopathy.  Heart:  This regular rhythm, slow rate. No S3 gallop or murmur.  Abdomen:  No hepatosplenomegaly or masses. Bowel sounds are normal.  HEENT:  Does show a lesion in the cleavage of the right  ear at the superior portion of the ear that has not healed for several weeks to a couple of months, he states.  He has an appointment with Dr. Reginia Forts assistant tomorrow, but I have asked him to make sure that she sees this as well.  Since this is a nonhealing ulcerated area, it may need a biopsy.  He will do that and will let me know if there is a problem.  At this time he has no arm edema, no leg edema as well.  We will see him back in 4 months.  Since he is going to [facility] in October, we will not do labs then, we will do them in November, and I suspect we can probably start doing labs every other month, I suspect, unless Dr. Greggory Stallion thinks differently.  He is very stable at this time.    ______________________________ Ladona Horns. Mariel Sleet, MD ESN/MEDQ  D:  07/16/2011  T:   07/16/2011  Job:  [id number]

## 2011-07-16 NOTE — Progress Notes (Signed)
This office note has been dictated.

## 2011-07-16 NOTE — Patient Instructions (Addendum)
[facility]  Discharge Instructions  RECOMMENDATIONS MADE BY THE CONSULTANT AND ANY TEST RESULTS WILL BE SENT TO YOUR REFERRING DOCTOR.   EXAM FINDINGS BY MD TODAY AND SIGNS AND SYMPTOMS TO REPORT TO CLINIC OR PRIMARY MD:   Return in November for labs and then to see Dr. Mariel Sleet in 4 months.  I acknowledge that I have been informed and understand all the instructions given to me and received a copy. I do not have any more questions at this time, but understand that I may call the Specialty Clinic at [facility] at [phone] during business hours should I have any further questions or need assistance in obtaining follow-up care.    __________________________________________  _____________  __________ Signature of Patient or Authorized Representative            Date                   Time    __________________________________________ Nurse's Signature

## 2011-07-18 ENCOUNTER — Encounter (HOSPITAL_BASED_OUTPATIENT_CLINIC_OR_DEPARTMENT_OTHER): Payer: Medicare Other

## 2011-07-18 DIAGNOSIS — C8589 Other specified types of non-Hodgkin lymphoma, extranodal and solid organ sites: Secondary | ICD-10-CM

## 2011-07-18 DIAGNOSIS — C9000 Multiple myeloma not having achieved remission: Secondary | ICD-10-CM

## 2011-07-18 MED ORDER — HEPARIN SOD (PORK) LOCK FLUSH 100 UNIT/ML IV SOLN
INTRAVENOUS | Status: AC
  Filled 2011-07-18: qty 5

## 2011-07-18 MED ORDER — SODIUM CHLORIDE 0.9 % IV SOLN
INTRAVENOUS | Status: DC
  Administered 2011-07-18: 500 mL via INTRAVENOUS

## 2011-07-18 MED ORDER — HEPARIN SOD (PORK) LOCK FLUSH 100 UNIT/ML IV SOLN
500.0000 [IU] | Freq: Once | INTRAVENOUS | Status: AC
  Administered 2011-07-18: 500 [IU] via INTRAVENOUS
  Filled 2011-07-18: qty 5

## 2011-07-18 MED ORDER — ZOLEDRONIC ACID 4 MG/5ML IV CONC
4.0000 mg | Freq: Once | INTRAVENOUS | Status: AC
  Administered 2011-07-18: 4 mg via INTRAVENOUS
  Filled 2011-07-18: qty 5

## 2011-07-18 MED ORDER — SODIUM CHLORIDE 0.9 % IJ SOLN
10.0000 mL | Freq: Once | INTRAMUSCULAR | Status: AC
  Administered 2011-07-18: 10 mL via INTRAVENOUS
  Filled 2011-07-18: qty 10

## 2011-07-18 NOTE — Progress Notes (Signed)
Tolerated zometa well. 

## 2011-07-19 ENCOUNTER — Telehealth (HOSPITAL_COMMUNITY): Payer: Self-pay

## 2011-07-19 LAB — MULTIPLE MYELOMA PANEL, SERUM
Albumin ELP: 59.5 % (ref 55.8–66.1)
Alpha-1-Globulin: 4.6 % (ref 2.9–4.9)
Alpha-2-Globulin: 9.9 % (ref 7.1–11.8)
IgG (Immunoglobin G), Serum: 1160 mg/dL (ref 650–1600)
IgM, Serum: 24 mg/dL — ABNORMAL LOW (ref 41–251)

## 2011-07-19 NOTE — Telephone Encounter (Signed)
All lab results from 07/16/11 faxed to Dr. Hezzie Bump office.

## 2011-07-22 ENCOUNTER — Ambulatory Visit (HOSPITAL_COMMUNITY)
Admission: RE | Admit: 2011-07-22 | Discharge: 2011-07-23 | Disposition: A | Discharge status: Home / Self Care | Payer: Medicare Other | Source: Ambulatory Visit | Attending: Cardiovascular Disease | Admitting: Cardiovascular Disease

## 2011-07-22 ENCOUNTER — Ambulatory Visit (HOSPITAL_COMMUNITY): Payer: Medicare Other

## 2011-07-22 DIAGNOSIS — Z9861 Coronary angioplasty status: Secondary | ICD-10-CM | POA: Insufficient documentation

## 2011-07-22 DIAGNOSIS — R0609 Other forms of dyspnea: Secondary | ICD-10-CM | POA: Insufficient documentation

## 2011-07-22 DIAGNOSIS — R197 Diarrhea, unspecified: Secondary | ICD-10-CM | POA: Insufficient documentation

## 2011-07-22 DIAGNOSIS — Z79899 Other long term (current) drug therapy: Secondary | ICD-10-CM | POA: Insufficient documentation

## 2011-07-22 DIAGNOSIS — J449 Chronic obstructive pulmonary disease, unspecified: Secondary | ICD-10-CM | POA: Insufficient documentation

## 2011-07-22 DIAGNOSIS — R5383 Other fatigue: Secondary | ICD-10-CM | POA: Insufficient documentation

## 2011-07-22 DIAGNOSIS — Z8551 Personal history of malignant neoplasm of bladder: Secondary | ICD-10-CM | POA: Insufficient documentation

## 2011-07-22 DIAGNOSIS — J4489 Other specified chronic obstructive pulmonary disease: Secondary | ICD-10-CM | POA: Insufficient documentation

## 2011-07-22 DIAGNOSIS — Z87898 Personal history of other specified conditions: Secondary | ICD-10-CM | POA: Insufficient documentation

## 2011-07-22 DIAGNOSIS — I2510 Atherosclerotic heart disease of native coronary artery without angina pectoris: Secondary | ICD-10-CM | POA: Insufficient documentation

## 2011-07-22 DIAGNOSIS — R5381 Other malaise: Secondary | ICD-10-CM | POA: Insufficient documentation

## 2011-07-22 DIAGNOSIS — R0989 Other specified symptoms and signs involving the circulatory and respiratory systems: Secondary | ICD-10-CM | POA: Insufficient documentation

## 2011-07-22 DIAGNOSIS — I495 Sick sinus syndrome: Secondary | ICD-10-CM | POA: Insufficient documentation

## 2011-07-22 HISTORY — PX: PACEMAKER INSERTION: SHX728

## 2011-07-22 LAB — KAPPA/LAMBDA LIGHT CHAINS
Kappa, lambda light chain ratio: 1.06 (ref 0.26–1.65)
Lambda free light chains: 1.71 mg/dL (ref 0.57–2.63)

## 2011-07-23 ENCOUNTER — Ambulatory Visit (HOSPITAL_COMMUNITY): Payer: Medicare Other

## 2011-07-31 NOTE — H&P (Signed)
NAME:  Jeremy Parrish, SPILLERS NO.:  [id number]  MEDICAL RECORD NO.:  [id number]  LOCATION:  [facility]                         FACILITY:  [facility]  PHYSICIAN:  Thurmon Fair, MD     DATE OF BIRTH:  [birth year]  DATE OF ADMISSION:  07/22/2011 DATE OF DISCHARGE:                             HISTORY & PHYSICAL   ADDENDUM  CHIEF COMPLAINT:  Fatigue secondary to slow heart rate.  HISTORY OF PRESENT ILLNESS:  A 70 year old white male with coronary artery disease, multiple interventions in the past, EF 45% with symptomatic bradycardia with fatigue secondary to chronotropic incompetence for permanent transvenous pacemaker.  He sees Dr. Allyson Sabal for coronary artery disease, but the patient had been having episodes of bradycardia with exertional fatigue, heart rates in the 40s and 50s.  He has a history of bladder cancer as well as lymphoma and myeloma.  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  He has talked to both Dr. Allyson Sabal and Dr. Royann Shivers at length concerning placement of pacemaker.  Dr. Allyson Sabal explained it will not prolong his life, but will give him better quality of life.  Past medical history includes: 1. Coronary artery disease dating back to 1996, with multiple PCIs,     last cath was in 2007.  He had patent stents in the mid right and     circumflex with 30% proximal LAD and EF 45%. 2. The patient has chronic dyspnea. 3. He has COPD on chest x-ray emphysema and bronchitic changes, left     basilar atelectasis.  He uses nebulizers at home.  OUTPATIENT MEDICATIONS: 1. Aspirin 81 mg daily. 2. Simvastatin 20 daily. 3. Citalopram 40 mg daily. 4. Ambien 10 mg at bedtime p.r.n. 5. Plavix 75 mg daily. 6. Amlodipine 10 mg daily. 7. Alprazolam 0.5 mg p.r.n. 8. Gabapentin 60 mg q.i.d. 9. Spironolactone 50 mg daily. 10.Revlimid 10 mg 1 daily for 7 days on all 7 days. 11.Zometa injection IV  monthly. 12.Oxycodone p.r.n. 13.Klor-Con 20 mEq b.i.d.  FAMILY HISTORY:  Not significant to this admission.  SOCIAL HISTORY:  Married, father of three.  REVIEW OF SYSTEMS:  GENERAL:  No colds or fevers.  NEUROLOGIC:  No syncope, just fatigue with his bradycardia.  PULMONARY:  Has cough and uses inhalers or nebulizers at home.  GI:  No blood in his stools or his urine.  GU:  Otherwise, no change in review of systems.  PHYSICAL EXAMINATION:  VITAL SIGNS:  Blood pressure 140/77, pulse 47, respiratory rate is 18, temperature 96.8, oxygen saturation 97%. GENERAL:  Alert and oriented white male in no acute distress, pleasant affect. SKIN:  Warm and dry.  Brisk capillary refill. HEENT:  Normocephalic.  Sclerae are clear. NECK:  Supple.  No JVD, no bruits. LUNGS:  Positive wheezes and rhonchi and cough with distant deep breath. HEART:  S1, S2.  Regular rate and rhythm. ABDOMEN:  Soft, nontender, positive bowel sounds. EXTREMITIES:  Without edema. NEUROLOGIC:  Alert and oriented x3.  LABORATORY DATA:  Stable from July 16, 2011.  The patient will proceed.  IMPRESSION: 1. Symptomatic bradycardia.  Plan for permanent  transvenous pacemaker     today. 2. Coronary artery disease with multiple interventions in the past. 3. History of lymphoma, but recent PET scan cancer free. 4. History of bladder cancer. 5. History of myeloma. 6. Chronic obstructive pulmonary disease.  PLAN:  Permanent transvenous pacemaker electively with Dr. Royann Shivers.     Darcella Gasman. Annie Paras, N.P.   ______________________________ Thurmon Fair, MD    LRI/MEDQ  D:  07/22/2011  T:  07/22/2011  Job:  [id number]  cc:   Nanetta Batty, M.D.  Electronically Signed by Nada Boozer N.P. on 07/22/2011 01:48:53 PM Electronically Signed by Thurmon Fair M.D. on 07/31/2011 11:26:34 AM

## 2011-07-31 NOTE — Op Note (Signed)
Jeremy Parrish, MIA NO.:  [id number]  MEDICAL RECORD NO.:  [id number]  LOCATION:  2504                         FACILITY:  [facility]  PHYSICIAN:  Thurmon Fair, MD     DATE OF BIRTH:  March 03, 1941  DATE OF PROCEDURE:  07/22/2011 DATE OF DISCHARGE:                              OPERATIVE REPORT   PROCEDURE PERFORMED: 1. Implantation of new dual-chamber permanent pacemaker. 2. Fluoroscopy. 3. Light sedation.  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  Jeremy Parrish is a 70 year old gentleman with chronic exertional fatigue and dyspnea and Holter monitor evidence of blunted circadian rhythm variation consistent with chronotropic incompetence.  Procedure performed by Thurmon Fair, MD  ESTIMATED BLOOD LOSS:  Less than 5 mL.  No complications.  MEDICATIONS:  Ancef 1 gram intravenously, Versed 4 mg intravenously, fentanyl 25 mcg intravenously, lidocaine 1% 30 mL locally.  DEVICE DETAILS:  Generator is a Medtronic Revo MRI safe pacemaker, model #RVDR01, serial number [id number].  The ventricular lead is a Medtronic 5086 MRI safe, lead 58 cm, serial number [id number].  The atrial lead is a Medtronic 5086 MRI safe - 52 cm, serial number [id number].  After risks and benefits of the procedure were described, the patient provided informed consent, was brought to the cardiac cath lab in a fasting state.  He was prepped and draped in the usual sterile fashion. Local anesthesia 1% lidocaine was administered to the left infraclavicular area.  A 6-cm horizontal incision was made parallel with the inferior border of the left clavicle, roughly 3 cm caudal to it.  Using electrocautery and blunt dissection, a prepectoral pocket was created down to the level of the pectoralis major fascia and carefully inspected for hemostasis and antibiotic-soaked sponge was placed in the pocket.  Under fluoroscopic guidance and using the  modified Seldinger technique, two separate venipunctures were performed and 2 separate J-tipped guidewires were placed in left subclavian vein.  This was suctioned exchanged for 8-French safe sheath.  Under fluoroscopic guidance, the ventricular lead was advanced to the level of the left ventricle apical septum.  The initial location chosen proved to have excessive variability and sensing and threshold parameters and a poor current of injury.  A second location also on the territory of the apical septum was found, which showed prominent current of injury and more satisfactory stability of electronic parameters.  The safe sheath was then peeled away and the lead was secured in place with 2-0 silk.  Stimulation at maximum device output did not produce any diaphragmatic/phrenic nerve stimulation.  In a similar fashion, the atrial lead was advanced to level of the right atrial appendage and active fixation helix was deployed.  There was prominent current of injury and satisfactory sensing and pacing values. Pacing at maximum device output did not produce any diaphragmatic/phrenic nerve stimulation.  The safe sheath was peeled away and the lead was secured in place using 2-0 silk.  The antibiotic-soaked sponge was removed from the pocket and the pocket was flushed with copious amounts of antibiotic solution, after which was reinspected for hemostasis, which was found to be excellent.  The generator was then attached to the leads.  The  ventricular lead was first attached and appropriate ventricular pacing was seen.  The atrial lead was then attached with appropriate atrial paced ventricular sensed rhythm being noted.  The generator was placed in the pocket with great care being taken that the leads be located deep to the generator.  The pocket was then closed in layers using 2 layers of 2-0 Vicryl and cutaneous staples, after which a sterile pressure dressing was applied.  No immediate  complications occurred.  At the end of the procedure, the following electronic parameters were encountered.  Right atrial lead sensed P-waves 3.6 mV, impedance 903 ohms, threshold 0.4 volts at 0.5 milliseconds pulse width.  Right ventricular lead sensed R-wave 7.8 mV, impedance 1486 ohms, threshold 0.8 volts at 0.5 milliseconds pulse width.     Thurmon Fair, MD     MC/MEDQ  D:  07/22/2011  T:  07/22/2011  Job:  [id number]  Electronically Signed by Thurmon Fair M.D. on 07/31/2011 11:26:38 AM

## 2011-08-04 NOTE — Discharge Summary (Signed)
  Jeremy Parrish, KOERBER NO.:  [id number]  MEDICAL RECORD NO.:  [id number]  LOCATION:  2504                         FACILITY:  [facility]  PHYSICIAN:  Thurmon Fair, MD     DATE OF BIRTH:  11-17-1940  DATE OF ADMISSION:  07/22/2011 DATE OF DISCHARGE:  07/23/2011                              DISCHARGE SUMMARY   DISCHARGE DIAGNOSES: 1. Bradycardia with chronotropic incompetence. 2. Medtronic pacemaker implant on July 22, 2011. 3. Known coronary artery disease with multiple percutaneous coronary     interventions in the past, last catheterization in 2007. 4. Mild left ventricular dysfunction with an ejection fraction of 45%. 5. Chronic obstructive pulmonary disease. 6. History of lymphoma, bladder cancer, and myeloma. 7. Diarrhea, chronic complaint secondary to medications.  HOSPITAL COURSE:  The patient is a 70 year old male with coronary artery disease as described above.  He has symptomatic bradycardia and fatigue. He was admitted electively for pacemaker implant.  Please see Dr. Erin Hearing office note for complete details.  The patient was admitted on July 22, 2011, and underwent implantation of a Medtronic device. He tolerated the procedure well.  Postoperatively, his chest x-ray shows COPD, but no pneumothorax.  During his hospitalization, he did have some diarrhea and a stool for C. difficile was sent.  The patient says that he has chronic diarrhea and he attributes it to his chemotherapy.  We feel he can be discharged later on July 23, 2011.  He will see Dr. Royann Shivers in [place] on July 29, 2011.  LABORATORY DATA:  Stool for C. difficile is pending.  Chest x-ray is as noted.  Telemetry shows paced rhythm.  Please see med rec for complete discharge medications.  DISPOSITION:  The patient will be discharged in stable condition and will follow up with Dr. Royann Shivers.     Abelino Derrick, P.A.   ______________________________ Thurmon Fair, MD    LKK/MEDQ  D:  07/23/2011  T:  07/23/2011  Job:  [id number]  Electronically Signed by Corine Shelter P.A. on 07/31/2011 04:54:09 PM Electronically Signed by Thurmon Fair M.D. on 08/04/2011 09:47:27 AM

## 2011-08-06 LAB — DIFFERENTIAL
Basophils Absolute: 0
Lymphocytes Relative: 11 — ABNORMAL LOW
Lymphs Abs: 0.7
Neutro Abs: 5.2
Neutrophils Relative %: 79 — ABNORMAL HIGH

## 2011-08-06 LAB — CBC
HCT: 30.3 — ABNORMAL LOW
MCHC: 34.4
MCV: 88.9
Platelets: 193
RBC: 3.41 — ABNORMAL LOW
WBC: 6.5

## 2011-08-06 LAB — COMPREHENSIVE METABOLIC PANEL
BUN: 12
CO2: 28
Calcium: 9
Chloride: 106
Creatinine, Ser: 0.76
GFR calc Af Amer: >60
GFR calc non Af Amer: >60
Total Bilirubin: 0.2 — ABNORMAL LOW

## 2011-08-06 LAB — LIPID PANEL
Cholesterol: 123
HDL: 26 — ABNORMAL LOW
LDL Cholesterol: 78
Triglycerides: 97

## 2011-08-06 LAB — LACTATE DEHYDROGENASE: LDH: 119

## 2011-08-12 LAB — COMPREHENSIVE METABOLIC PANEL
Albumin: 3.5
BUN: 11
Calcium: 8.8
Chloride: 106
Creatinine, Ser: 0.93
Total Bilirubin: 0.5

## 2011-08-12 LAB — CBC
HCT: 34.8 — ABNORMAL LOW
MCHC: 34.7
MCV: 92.9
Platelets: 177
RDW: 14.5
WBC: 3.5 — ABNORMAL LOW

## 2011-08-12 LAB — DIFFERENTIAL
Basophils Absolute: 0
Lymphocytes Relative: 27
Lymphs Abs: 0.9
Monocytes Absolute: 0.2
Neutro Abs: 2.2

## 2011-08-12 LAB — LACTATE DEHYDROGENASE: LDH: 127

## 2011-08-13 LAB — COMPREHENSIVE METABOLIC PANEL
ALT: 26
AST: 28
CO2: 26
Chloride: 109
GFR calc Af Amer: >60
GFR calc non Af Amer: >60
Sodium: 138
Total Bilirubin: 0.2 — ABNORMAL LOW

## 2011-08-13 LAB — CBC
MCV: 94.2
RBC: 3.64 — ABNORMAL LOW
WBC: 3.8 — ABNORMAL LOW

## 2011-08-14 ENCOUNTER — Other Ambulatory Visit (HOSPITAL_COMMUNITY): Payer: Self-pay | Admitting: Oncology

## 2011-08-14 ENCOUNTER — Encounter (HOSPITAL_COMMUNITY): Payer: Medicare Other | Attending: Oncology

## 2011-08-14 DIAGNOSIS — C8589 Other specified types of non-Hodgkin lymphoma, extranodal and solid organ sites: Secondary | ICD-10-CM | POA: Insufficient documentation

## 2011-08-14 DIAGNOSIS — C9000 Multiple myeloma not having achieved remission: Secondary | ICD-10-CM

## 2011-08-14 LAB — DIFFERENTIAL
Basophils Absolute: 0 10*3/uL (ref 0.0–0.1)
Basophils Relative: 1 % (ref 0–1)
Eosinophils Relative: 5 % (ref 0–5)
Lymphocytes Relative: 38 % (ref 12–46)
Monocytes Absolute: 0.3 10*3/uL (ref 0.1–1.0)

## 2011-08-14 LAB — COMPREHENSIVE METABOLIC PANEL
ALT: 19 U/L (ref 0–53)
AST: 21 U/L (ref 0–37)
Albumin: 3.9 g/dL (ref 3.5–5.2)
Alkaline Phosphatase: 59 U/L (ref 39–117)
GFR calc Af Amer: 87 mL/min — ABNORMAL LOW (ref >90)
Glucose, Bld: 127 mg/dL — ABNORMAL HIGH (ref 70–99)
Potassium: 3.8 mEq/L (ref 3.5–5.1)
Sodium: 137 mEq/L (ref 135–145)
Total Protein: 6.6 g/dL (ref 6.0–8.3)

## 2011-08-14 LAB — CBC
HCT: 37.9 % — ABNORMAL LOW (ref 39.0–52.0)
MCHC: 33.2 g/dL (ref 30.0–36.0)
MCV: 91.5 fL (ref 78.0–100.0)
RDW: 15.9 % — ABNORMAL HIGH (ref 11.5–15.5)

## 2011-08-14 NOTE — Progress Notes (Signed)
Labs drawn today for cbc,diff,kllc,mm,cmp

## 2011-08-15 ENCOUNTER — Encounter (HOSPITAL_BASED_OUTPATIENT_CLINIC_OR_DEPARTMENT_OTHER): Payer: Medicare Other

## 2011-08-15 DIAGNOSIS — C9000 Multiple myeloma not having achieved remission: Secondary | ICD-10-CM

## 2011-08-15 DIAGNOSIS — C8589 Other specified types of non-Hodgkin lymphoma, extranodal and solid organ sites: Secondary | ICD-10-CM

## 2011-08-15 MED ORDER — LORAZEPAM 2 MG/ML IJ SOLN
INTRAMUSCULAR | Status: AC
  Filled 2011-08-15: qty 1

## 2011-08-15 MED ORDER — HEPARIN SOD (PORK) LOCK FLUSH 100 UNIT/ML IV SOLN
INTRAVENOUS | Status: AC
  Administered 2011-08-15: 500 [IU] via INTRAVENOUS
  Filled 2011-08-15: qty 5

## 2011-08-15 MED ORDER — SODIUM CHLORIDE 0.9 % IV SOLN
INTRAVENOUS | Status: DC
  Administered 2011-08-15: 09:00:00 via INTRAVENOUS

## 2011-08-15 MED ORDER — ZOLEDRONIC ACID 4 MG/5ML IV CONC
4.0000 mg | Freq: Once | INTRAVENOUS | Status: AC
  Administered 2011-08-15: 4 mg via INTRAVENOUS
  Filled 2011-08-15: qty 5

## 2011-08-15 MED ORDER — HEPARIN SOD (PORK) LOCK FLUSH 100 UNIT/ML IV SOLN
500.0000 [IU] | Freq: Once | INTRAVENOUS | Status: AC
  Administered 2011-08-15: 500 [IU] via INTRAVENOUS
  Filled 2011-08-15: qty 5

## 2011-08-19 LAB — MULTIPLE MYELOMA PANEL, SERUM
Alpha-2-Globulin: 9.6 % (ref 7.1–11.8)
Beta Globulin: 6.9 % (ref 4.7–7.2)
IgA: 185 mg/dL (ref 68–379)
M-Spike, %: 0.44 g/dL
Total Protein: 6.7 g/dL (ref 6.0–8.3)

## 2011-08-21 LAB — CBC
HCT: 36.5 — ABNORMAL LOW
MCHC: 34.3
MCV: 89.9
Platelets: 176
RDW: 14.2 — ABNORMAL HIGH

## 2011-08-21 LAB — COMPREHENSIVE METABOLIC PANEL
Albumin: 3.8
BUN: 11
Calcium: 9.2
Creatinine, Ser: 0.86
Total Protein: 7.5

## 2011-08-21 LAB — DIFFERENTIAL
Basophils Relative: 1
Eosinophils Relative: 3
Monocytes Absolute: 0.3
Monocytes Relative: 8
Neutro Abs: 2.3

## 2011-08-26 LAB — COMPREHENSIVE METABOLIC PANEL
AST: 23
CO2: 26
Calcium: 9.1
Creatinine, Ser: 0.77
GFR calc Af Amer: >60
GFR calc non Af Amer: >60
Glucose, Bld: 86
Total Protein: 7.2

## 2011-08-26 LAB — CBC
MCHC: 34.4
MCV: 91
RBC: 4.24
RDW: 14.8 — ABNORMAL HIGH

## 2011-08-26 LAB — LACTATE DEHYDROGENASE: LDH: 134

## 2011-08-26 LAB — DIFFERENTIAL
Lymphocytes Relative: 22
Lymphs Abs: 1
Neutrophils Relative %: 65

## 2011-09-23 ENCOUNTER — Encounter (HOSPITAL_COMMUNITY): Payer: Medicare Other | Attending: Oncology

## 2011-09-23 DIAGNOSIS — C9000 Multiple myeloma not having achieved remission: Secondary | ICD-10-CM

## 2011-09-23 LAB — DIFFERENTIAL
Basophils Absolute: 0 10*3/uL (ref 0.0–0.1)
Lymphocytes Relative: 29 % (ref 12–46)
Lymphs Abs: 1.4 10*3/uL (ref 0.7–4.0)
Monocytes Absolute: 0.5 10*3/uL (ref 0.1–1.0)
Monocytes Relative: 10 % (ref 3–12)
Neutro Abs: 2.7 10*3/uL (ref 1.7–7.7)

## 2011-09-23 LAB — CBC
HCT: 35.4 % — ABNORMAL LOW (ref 39.0–52.0)
Hemoglobin: 11.5 g/dL — ABNORMAL LOW (ref 13.0–17.0)
WBC: 4.7 10*3/uL (ref 4.0–10.5)

## 2011-09-23 LAB — COMPREHENSIVE METABOLIC PANEL
ALT: 22 U/L (ref 0–53)
Calcium: 8.8 mg/dL (ref 8.4–10.5)
GFR calc Af Amer: >90 mL/min (ref >90)
Glucose, Bld: 95 mg/dL (ref 70–99)
Sodium: 137 mEq/L (ref 135–145)
Total Protein: 6.7 g/dL (ref 6.0–8.3)

## 2011-09-23 NOTE — Progress Notes (Signed)
Labs drawn today for mm panel,kllc,cmp,cbc,diff

## 2011-09-24 ENCOUNTER — Encounter (HOSPITAL_BASED_OUTPATIENT_CLINIC_OR_DEPARTMENT_OTHER): Payer: Medicare Other

## 2011-09-24 VITALS — BP 107/69 | HR 73 | Temp 98.3°F

## 2011-09-24 DIAGNOSIS — C9000 Multiple myeloma not having achieved remission: Secondary | ICD-10-CM

## 2011-09-24 LAB — KAPPA/LAMBDA LIGHT CHAINS: Kappa free light chain: 2.9 mg/dL — ABNORMAL HIGH (ref 0.33–1.94)

## 2011-09-24 MED ORDER — SODIUM CHLORIDE 0.9 % IV SOLN
INTRAVENOUS | Status: DC
  Administered 2011-09-24: 11:00:00 via INTRAVENOUS

## 2011-09-24 MED ORDER — HEPARIN SOD (PORK) LOCK FLUSH 100 UNIT/ML IV SOLN
500.0000 [IU] | Freq: Once | INTRAVENOUS | Status: AC
  Administered 2011-09-24: 500 [IU] via INTRAVENOUS
  Filled 2011-09-24: qty 5

## 2011-09-24 MED ORDER — HEPARIN SOD (PORK) LOCK FLUSH 100 UNIT/ML IV SOLN
INTRAVENOUS | Status: AC
  Filled 2011-09-24: qty 5

## 2011-09-24 MED ORDER — ZOLEDRONIC ACID 4 MG/5ML IV CONC
4.0000 mg | Freq: Once | INTRAVENOUS | Status: AC
  Administered 2011-09-24: 4 mg via INTRAVENOUS
  Filled 2011-09-24: qty 5

## 2011-09-26 LAB — MULTIPLE MYELOMA PANEL, SERUM
Alpha-1-Globulin: 6.7 % — ABNORMAL HIGH (ref 2.9–4.9)
Alpha-2-Globulin: 12.4 % — ABNORMAL HIGH (ref 7.1–11.8)
Beta 2: 5.4 % (ref 3.2–6.5)
Gamma Globulin: 13.2 % (ref 11.1–18.8)
IgG (Immunoglobin G), Serum: 829 mg/dL (ref 650–1600)

## 2011-10-07 ENCOUNTER — Ambulatory Visit (HOSPITAL_COMMUNITY)
Admission: RE | Admit: 2011-10-07 | Discharge: 2011-10-07 | Disposition: A | Discharge status: Home / Self Care | Payer: Medicare Other | Source: Ambulatory Visit | Attending: Family Medicine | Admitting: Family Medicine

## 2011-10-07 ENCOUNTER — Other Ambulatory Visit: Payer: Self-pay | Admitting: Family Medicine

## 2011-10-07 DIAGNOSIS — R059 Cough, unspecified: Secondary | ICD-10-CM | POA: Insufficient documentation

## 2011-10-07 DIAGNOSIS — R05 Cough: Secondary | ICD-10-CM | POA: Insufficient documentation

## 2011-10-07 DIAGNOSIS — J4489 Other specified chronic obstructive pulmonary disease: Secondary | ICD-10-CM | POA: Insufficient documentation

## 2011-10-07 DIAGNOSIS — R509 Fever, unspecified: Secondary | ICD-10-CM | POA: Insufficient documentation

## 2011-10-07 DIAGNOSIS — J449 Chronic obstructive pulmonary disease, unspecified: Secondary | ICD-10-CM | POA: Insufficient documentation

## 2011-10-18 ENCOUNTER — Other Ambulatory Visit: Payer: Self-pay | Admitting: Family Medicine

## 2011-10-18 ENCOUNTER — Ambulatory Visit (HOSPITAL_COMMUNITY)
Admission: RE | Admit: 2011-10-18 | Discharge: 2011-10-18 | Disposition: A | Discharge status: Home / Self Care | Payer: Medicare Other | Source: Ambulatory Visit | Attending: Family Medicine | Admitting: Family Medicine

## 2011-10-18 DIAGNOSIS — R059 Cough, unspecified: Secondary | ICD-10-CM | POA: Insufficient documentation

## 2011-10-18 DIAGNOSIS — R0602 Shortness of breath: Secondary | ICD-10-CM | POA: Insufficient documentation

## 2011-10-18 DIAGNOSIS — R05 Cough: Secondary | ICD-10-CM

## 2011-10-21 ENCOUNTER — Encounter (HOSPITAL_COMMUNITY): Payer: Self-pay | Admitting: Oncology

## 2011-10-21 ENCOUNTER — Encounter (HOSPITAL_BASED_OUTPATIENT_CLINIC_OR_DEPARTMENT_OTHER): Payer: Medicare Other

## 2011-10-21 ENCOUNTER — Encounter (HOSPITAL_BASED_OUTPATIENT_CLINIC_OR_DEPARTMENT_OTHER): Payer: Medicare Other | Admitting: Oncology

## 2011-10-21 ENCOUNTER — Encounter (HOSPITAL_COMMUNITY): Payer: Medicare Other | Attending: Oncology

## 2011-10-21 VITALS — BP 125/73 | HR 71 | Temp 98.6°F

## 2011-10-21 DIAGNOSIS — C9000 Multiple myeloma not having achieved remission: Secondary | ICD-10-CM

## 2011-10-21 DIAGNOSIS — J180 Bronchopneumonia, unspecified organism: Secondary | ICD-10-CM | POA: Insufficient documentation

## 2011-10-21 DIAGNOSIS — Z8546 Personal history of malignant neoplasm of prostate: Secondary | ICD-10-CM

## 2011-10-21 DIAGNOSIS — D849 Immunodeficiency, unspecified: Secondary | ICD-10-CM

## 2011-10-21 DIAGNOSIS — C8589 Other specified types of non-Hodgkin lymphoma, extranodal and solid organ sites: Secondary | ICD-10-CM | POA: Insufficient documentation

## 2011-10-21 LAB — DIFFERENTIAL
Basophils Relative: 0 % (ref 0–1)
Eosinophils Absolute: 0.1 10*3/uL (ref 0.0–0.7)
Eosinophils Relative: 3 % (ref 0–5)
Neutrophils Relative %: 65 % (ref 43–77)

## 2011-10-21 LAB — COMPREHENSIVE METABOLIC PANEL
Alkaline Phosphatase: 64 U/L (ref 39–117)
BUN: 10 mg/dL (ref 6–23)
CO2: 24 mEq/L (ref 19–32)
Chloride: 105 mEq/L (ref 96–112)
GFR calc Af Amer: 85 mL/min — ABNORMAL LOW (ref >90)
GFR calc non Af Amer: 73 mL/min — ABNORMAL LOW (ref >90)
Glucose, Bld: 127 mg/dL — ABNORMAL HIGH (ref 70–99)
Potassium: 3.3 mEq/L — ABNORMAL LOW (ref 3.5–5.1)
Total Bilirubin: 0.8 mg/dL (ref 0.3–1.2)

## 2011-10-21 LAB — CBC
HCT: 36 % — ABNORMAL LOW (ref 39.0–52.0)
Hemoglobin: 11.8 g/dL — ABNORMAL LOW (ref 13.0–17.0)
MCHC: 32.8 g/dL (ref 30.0–36.0)
RBC: 3.93 MIL/uL — ABNORMAL LOW (ref 4.22–5.81)
WBC: 2.7 10*3/uL — ABNORMAL LOW (ref 4.0–10.5)

## 2011-10-21 MED ORDER — SODIUM CHLORIDE 0.9 % IJ SOLN
10.0000 mL | INTRAMUSCULAR | Status: DC | PRN
  Filled 2011-10-21: qty 10

## 2011-10-21 MED ORDER — ALTEPLASE 2 MG IJ SOLR
INTRAMUSCULAR | Status: AC
  Filled 2011-10-21: qty 2

## 2011-10-21 MED ORDER — HEPARIN SOD (PORK) LOCK FLUSH 100 UNIT/ML IV SOLN
500.0000 [IU] | Freq: Once | INTRAVENOUS | Status: DC
  Filled 2011-10-21: qty 5

## 2011-10-21 MED ORDER — SODIUM CHLORIDE 0.9 % IV SOLN
INTRAVENOUS | Status: DC
  Administered 2011-10-21: 11:00:00 via INTRAVENOUS

## 2011-10-21 MED ORDER — ALTEPLASE 2 MG IJ SOLR
2.0000 mg | Freq: Once | INTRAMUSCULAR | Status: AC
  Administered 2011-10-21: 2 mg
  Filled 2011-10-21: qty 2

## 2011-10-21 MED ORDER — IMMUNE GLOBULIN (HUMAN) 20 GM/200ML IV SOLN
1.0000 g/kg | INTRAVENOUS | Status: DC
  Administered 2011-10-21: 85 g via INTRAVENOUS
  Filled 2011-10-21 (×2): qty 850

## 2011-10-21 NOTE — Progress Notes (Signed)
Tolerated well

## 2011-10-21 NOTE — Progress Notes (Signed)
This office note has been dictated.

## 2011-10-21 NOTE — Progress Notes (Signed)
Labs drawn today for mm panel,kllc,cmp,cbcdiff

## 2011-10-21 NOTE — Progress Notes (Signed)
Tolerated ivig well,  Alteplase 2mg /ml instilled to port,  right chest.

## 2011-10-21 NOTE — Progress Notes (Signed)
CC:   Jeremy A. Gerda Diss, MD  DIAGNOSES: 1. Bronchopneumonia. 2. 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 3. History of bone marrow transplant. 4. Emphysema secondary to longstanding smoking history. 5. History of recent pacemaker insertion September 2012 by Dr.     Royann Shivers. 6. History of non-Hodgkin lymphoma, stage IV, low-grade B-cell type,     but was symptomatic at the time of presentation in July 2000,     treated with R-CHOP x6 cycles with an excellent response and PET     scan in June of this year was negative for recurrence. 7. 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 8. Bronchiectasis. 9. Coronary artery disease, status post 5 stent placements in the     past. 10.Prostate cancer, status post radical prostatectomy in 2002 by Dr.     Vic Blackbird. 11.Bilateral Dupuytren palmar fibromatosis, for which he sees Dr. Laroy Apple. 12.Osteoporosis on therapy with Zometa, calcium, and vitamin D. 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 Vic Blackbird and Dr. Jaclynn Guarneri. Jeremy Parrish is here today with his wife for blood work and mentioned to the nurse that he has been sick for at least a month.  He is on his 3rd antibiotic according to him and he is running fevers as high as 102 degrees with some chills over the weekend.  He is on Avelox presently, which just got renewed on Friday.  He is not febrile today, no shaking chills this morning, but he looks chronically fatigued.  He is coughing, but not bringing up anything. His lower chest wall muscles and upper abdominal muscles, he states are sore from coughing.  PHYSICAL EXAMINATION:  Skin:  Warm and dry to the touch.  Lymph:  There is no obvious adenopathy in the cervical, supraclavicular, infraclavicular, or axillary areas.  Lungs:  Though show numerous wheezes, rhonchi, and I think a friction rub on the  left mid chest and there are rales at the bases bilaterally.  They do not clear with coughing.  Heart:  Shows a very regular rhythm and rate without an S3 gallop.  The Port-A-Cath is intact in the left upper chest.  Pacemaker is intact.  Extremities:  He has no ankle edema.  Abdomen:  Soft and nontender at this time except just underneath the rib cage, where it is slightly tender just to palpation.  I think his immune deficiency needs correction with IVIG.  He needs to finish the Avelox, push the fluids.  We need to make sure he is on an inhaler as well.  He has his albuterol at home and an Advair inhaler which he needs to use probably twice a day for 2 weeks.  We will see him back next week for the Zometa, but he will come tomorrow also for IVIG 1 g/kg both today and tomorrow.  I will see him in January as planned.    ______________________________ Ladona Horns. Mariel Sleet, MD ESN/MEDQ  D:  10/21/2011  T:  10/21/2011  Job:  [id number]

## 2011-10-22 ENCOUNTER — Encounter (HOSPITAL_BASED_OUTPATIENT_CLINIC_OR_DEPARTMENT_OTHER): Payer: Medicare Other

## 2011-10-22 ENCOUNTER — Other Ambulatory Visit (HOSPITAL_COMMUNITY): Payer: Self-pay

## 2011-10-22 ENCOUNTER — Ambulatory Visit (HOSPITAL_COMMUNITY): Payer: Medicare Other

## 2011-10-22 VITALS — BP 127/75 | HR 60 | Temp 97.6°F | Wt 191.2 lb

## 2011-10-22 DIAGNOSIS — J180 Bronchopneumonia, unspecified organism: Secondary | ICD-10-CM

## 2011-10-22 DIAGNOSIS — D849 Immunodeficiency, unspecified: Secondary | ICD-10-CM

## 2011-10-22 DIAGNOSIS — C9000 Multiple myeloma not having achieved remission: Secondary | ICD-10-CM

## 2011-10-22 DIAGNOSIS — C8589 Other specified types of non-Hodgkin lymphoma, extranodal and solid organ sites: Secondary | ICD-10-CM

## 2011-10-22 LAB — KAPPA/LAMBDA LIGHT CHAINS
Kappa free light chain: 2.04 mg/dL — ABNORMAL HIGH (ref 0.33–1.94)
Lambda free light chains: 2.07 mg/dL (ref 0.57–2.63)

## 2011-10-22 MED ORDER — HEPARIN SOD (PORK) LOCK FLUSH 100 UNIT/ML IV SOLN
INTRAVENOUS | Status: AC
  Filled 2011-10-22: qty 5

## 2011-10-22 MED ORDER — IMMUNE GLOBULIN (HUMAN) 20 GM/200ML IV SOLN
1.0000 g/kg | INTRAVENOUS | Status: DC
  Administered 2011-10-22: 85 g via INTRAVENOUS
  Filled 2011-10-22 (×2): qty 850

## 2011-10-22 MED ORDER — SODIUM CHLORIDE 0.9 % IJ SOLN
INTRAMUSCULAR | Status: AC
  Filled 2011-10-22: qty 10

## 2011-10-22 MED ORDER — SODIUM CHLORIDE 0.9 % IV SOLN
INTRAVENOUS | Status: DC
  Administered 2011-10-22: 14:00:00 via INTRAVENOUS

## 2011-10-22 MED ORDER — SODIUM CHLORIDE 0.9 % IJ SOLN
10.0000 mL | INTRAMUSCULAR | Status: DC | PRN
  Administered 2011-10-22: 10 mL via INTRAVENOUS
  Filled 2011-10-22: qty 10

## 2011-10-22 MED ORDER — HEPARIN SOD (PORK) LOCK FLUSH 100 UNIT/ML IV SOLN
500.0000 [IU] | Freq: Once | INTRAVENOUS | Status: AC
  Administered 2011-10-22: 500 [IU] via INTRAVENOUS
  Filled 2011-10-22: qty 5

## 2011-10-23 LAB — MULTIPLE MYELOMA PANEL, SERUM
Beta 2: 5.6 % (ref 3.2–6.5)
Beta Globulin: 6.2 % (ref 4.7–7.2)
Gamma Globulin: 10.2 % — ABNORMAL LOW (ref 11.1–18.8)

## 2011-10-28 ENCOUNTER — Encounter (HOSPITAL_BASED_OUTPATIENT_CLINIC_OR_DEPARTMENT_OTHER): Payer: Medicare Other

## 2011-10-28 DIAGNOSIS — C9000 Multiple myeloma not having achieved remission: Secondary | ICD-10-CM

## 2011-10-28 DIAGNOSIS — C8589 Other specified types of non-Hodgkin lymphoma, extranodal and solid organ sites: Secondary | ICD-10-CM

## 2011-10-28 MED ORDER — HEPARIN SOD (PORK) LOCK FLUSH 100 UNIT/ML IV SOLN
INTRAVENOUS | Status: AC
  Administered 2011-10-28: 500 [IU]
  Filled 2011-10-28: qty 5

## 2011-10-28 MED ORDER — ZOLEDRONIC ACID 4 MG/5ML IV CONC
4.0000 mg | Freq: Once | INTRAVENOUS | Status: AC
  Administered 2011-10-28: 4 mg via INTRAVENOUS
  Filled 2011-10-28: qty 5

## 2011-10-28 MED ORDER — SODIUM CHLORIDE 0.9 % IV SOLN
Freq: Once | INTRAVENOUS | Status: AC
  Administered 2011-10-28: 500 mL via INTRAVENOUS

## 2011-10-28 MED ORDER — HEPARIN SOD (PORK) LOCK FLUSH 100 UNIT/ML IV SOLN
500.0000 [IU] | Freq: Once | INTRAVENOUS | Status: AC | PRN
  Administered 2011-10-28: 500 [IU]
  Filled 2011-10-28: qty 5

## 2011-10-28 MED ORDER — SODIUM CHLORIDE 0.9 % IJ SOLN
10.0000 mL | INTRAMUSCULAR | Status: DC | PRN
  Administered 2011-10-28: 10 mL
  Filled 2011-10-28: qty 10

## 2011-10-29 ENCOUNTER — Other Ambulatory Visit (HOSPITAL_COMMUNITY): Payer: Self-pay | Admitting: Oncology

## 2011-10-29 DIAGNOSIS — R197 Diarrhea, unspecified: Secondary | ICD-10-CM

## 2011-10-29 MED ORDER — DIPHENOXYLATE-ATROPINE 2.5-0.025 MG PO TABS: 1.0000 | ORAL_TABLET | Freq: Four times a day (QID) | ORAL | Status: DC | PRN

## 2011-11-06 ENCOUNTER — Encounter: Payer: Self-pay | Admitting: Oncology

## 2011-11-12 DIAGNOSIS — I639 Cerebral infarction, unspecified: Secondary | ICD-10-CM

## 2011-11-12 HISTORY — DX: Cerebral infarction, unspecified: I63.9

## 2011-11-15 ENCOUNTER — Encounter (HOSPITAL_COMMUNITY): Payer: Medicare Other | Attending: Oncology | Admitting: Oncology

## 2011-11-15 ENCOUNTER — Other Ambulatory Visit (HOSPITAL_COMMUNITY): Payer: Self-pay | Admitting: Oncology

## 2011-11-15 ENCOUNTER — Encounter (HOSPITAL_COMMUNITY): Payer: Self-pay | Admitting: Oncology

## 2011-11-15 VITALS — BP 138/82 | HR 68 | Temp 97.8°F | Wt 187.2 lb

## 2011-11-15 DIAGNOSIS — C8589 Other specified types of non-Hodgkin lymphoma, extranodal and solid organ sites: Secondary | ICD-10-CM | POA: Diagnosis not present

## 2011-11-15 DIAGNOSIS — C44201 Unspecified malignant neoplasm of skin of unspecified ear and external auricular canal: Secondary | ICD-10-CM

## 2011-11-15 DIAGNOSIS — C9000 Multiple myeloma not having achieved remission: Secondary | ICD-10-CM | POA: Diagnosis not present

## 2011-11-15 DIAGNOSIS — Z8546 Personal history of malignant neoplasm of prostate: Secondary | ICD-10-CM

## 2011-11-15 NOTE — Patient Instructions (Signed)
[facility]  Discharge Instructions Jeremy Parrish  [id number] Oct 13, 1941   RECOMMENDATIONS MADE BY THE CONSULTANT AND ANY TEST RESULTS WILL BE SENT TO YOUR REFERRING DOCTOR.   EXAM FINDINGS BY MD TODAY AND SIGNS AND SYMPTOMS TO REPORT TO CLINIC OR PRIMARY MD: Exam findings good; follow-up in 3 months as discussed - labs to be drawn prior to that.  I acknowledge that I have been informed and understand all the instructions given to me and received a copy. I do not have any more questions at this time, but understand that I may call the Specialty Clinic at [facility] at [phone] during business hours should I have any further questions or need assistance in obtaining follow-up care.    __________________________________________  _____________  __________ Signature of Patient or Authorized Representative            Date                   Time    __________________________________________ Nurse's Signature

## 2011-11-15 NOTE — Progress Notes (Signed)
CC:   Jeremy A. Gerda Diss, MD Jaclyn Prime Greggory Stallion, M.D. Charlaine Dalton. Sherene Sires, MD, FCCP Katy Fitch. Sypher, M.D. Nanetta Batty, M.D.  DIAGNOSES: 1. IgG lambda multiple myeloma, status post 4 cycles of dexamethasone     and Velcade followed by autologous stem cell transplant; thus far,     without recurrence. 2. Non-Hodgkin's lymphoma stage IVB low-grade B-cell type symptomatic     at presentation in July 2002 with R-CHOP x6 cycles with an     excellent response and a PET scan, which was done most recently on     04/29/2011, that shows no evidence for recurrent disease. 3. 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     Park Liter. 4. History of prostate cancer, status post radical prostatectomy in     2002 by Dr. Vic Blackbird. 5. Chronic obstructive pulmonary disease with bronchiectasis, quitting     smoking in 1995 after many years of cigarette usage. 6. Peripheral neuropathy, much improved since his transplant with     minimal symptoms now. 7. Coronary artery disease, status post 5 stents placed in the past. 8. Osteoporosis and he is on therapy. 9. Recent bronchopneumonia in December 2012 requiring IVIG for     resolution due to his immune deficiency status. 10.Bilateral Dupuytren's palmar fibromatosis, for which he has seen     Dr. Laroy Apple.  INTERVAL HISTORY:  Jeremy Parrish is feeling like a different person.  He states no more yellow or green stuff coming up when he coughs.  He has a little bit of clear phlegm only.  No fever.  No chills, etc.  He is not febrile today.  His vital signs are very stable.  His labs are pending for the 17th.  He does not seen Dr. Greggory Stallion, I do not believe, until September or October 2013.  He did have a pulmonary consultation appointment with a doctor at [facility], he states.  He is going to postpone that since he has been ill and is recovering now for another 8 weeks probably.  I have encouraged him to continue that appointment followup since there  maybe things for his bronchiectasis he can help [place] with.  He has already seen Dr. Sherene Sires, I believe, which is how he got to [facility].  His vital signs today are all stable.  He is afebrile.  No complaints of pain, etc.  He is looking quite good.  I will see him in 3 months either way.    ______________________________ Ladona Horns. Mariel Sleet, MD ESN/MEDQ  D:  11/15/2011  T:  11/15/2011  Job:  [id number]

## 2011-11-15 NOTE — Progress Notes (Signed)
This office note has been dictated.

## 2011-11-18 DIAGNOSIS — H5200 Hypermetropia, unspecified eye: Secondary | ICD-10-CM | POA: Diagnosis not present

## 2011-11-18 DIAGNOSIS — H35379 Puckering of macula, unspecified eye: Secondary | ICD-10-CM | POA: Diagnosis not present

## 2011-11-18 DIAGNOSIS — H52229 Regular astigmatism, unspecified eye: Secondary | ICD-10-CM | POA: Diagnosis not present

## 2011-11-18 DIAGNOSIS — H43819 Vitreous degeneration, unspecified eye: Secondary | ICD-10-CM | POA: Diagnosis not present

## 2011-11-28 ENCOUNTER — Encounter (HOSPITAL_BASED_OUTPATIENT_CLINIC_OR_DEPARTMENT_OTHER): Payer: Medicare Other

## 2011-11-28 DIAGNOSIS — C8589 Other specified types of non-Hodgkin lymphoma, extranodal and solid organ sites: Secondary | ICD-10-CM | POA: Diagnosis not present

## 2011-11-28 DIAGNOSIS — C9000 Multiple myeloma not having achieved remission: Secondary | ICD-10-CM

## 2011-11-28 LAB — CBC
HCT: 39.4 % (ref 39.0–52.0)
Hemoglobin: 12.7 g/dL — ABNORMAL LOW (ref 13.0–17.0)
WBC: 3.3 10*3/uL — ABNORMAL LOW (ref 4.0–10.5)

## 2011-11-28 LAB — COMPREHENSIVE METABOLIC PANEL
AST: 31 U/L (ref 0–37)
Alkaline Phosphatase: 68 U/L (ref 39–117)
BUN: 12 mg/dL (ref 6–23)
CO2: 26 mEq/L (ref 19–32)
Chloride: 105 mEq/L (ref 96–112)
Creatinine, Ser: 0.84 mg/dL (ref 0.50–1.35)
GFR calc non Af Amer: 87 mL/min — ABNORMAL LOW (ref >90)
Potassium: 4 mEq/L (ref 3.5–5.1)
Total Bilirubin: 0.5 mg/dL (ref 0.3–1.2)

## 2011-11-28 LAB — DIFFERENTIAL
Basophils Absolute: 0 10*3/uL (ref 0.0–0.1)
Lymphocytes Relative: 45 % (ref 12–46)
Monocytes Absolute: 0.4 10*3/uL (ref 0.1–1.0)
Monocytes Relative: 11 % (ref 3–12)
Neutro Abs: 1.1 10*3/uL — ABNORMAL LOW (ref 1.7–7.7)
Neutrophils Relative %: 35 % — ABNORMAL LOW (ref 43–77)

## 2011-11-28 NOTE — Progress Notes (Signed)
Labs drawn today for cbc,diff,cmp,mm panel,kllc

## 2011-11-29 ENCOUNTER — Encounter (HOSPITAL_BASED_OUTPATIENT_CLINIC_OR_DEPARTMENT_OTHER): Payer: Medicare Other

## 2011-11-29 DIAGNOSIS — C9000 Multiple myeloma not having achieved remission: Secondary | ICD-10-CM | POA: Diagnosis not present

## 2011-11-29 DIAGNOSIS — C8589 Other specified types of non-Hodgkin lymphoma, extranodal and solid organ sites: Secondary | ICD-10-CM

## 2011-11-29 LAB — KAPPA/LAMBDA LIGHT CHAINS: Lambda free light chains: 1.93 mg/dL (ref 0.57–2.63)

## 2011-11-29 MED ORDER — ZOLEDRONIC ACID 4 MG/5ML IV CONC
4.0000 mg | Freq: Once | INTRAVENOUS | Status: AC
  Administered 2011-11-29: 4 mg via INTRAVENOUS
  Filled 2011-11-29: qty 5

## 2011-11-29 MED ORDER — SODIUM CHLORIDE 0.9 % IV SOLN
Freq: Once | INTRAVENOUS | Status: AC
  Administered 2011-11-29: 11:00:00 via INTRAVENOUS

## 2011-11-29 MED ORDER — SODIUM CHLORIDE 0.9 % IJ SOLN
10.0000 mL | INTRAMUSCULAR | Status: DC | PRN
  Administered 2011-11-29: 10 mL
  Filled 2011-11-29: qty 10

## 2011-11-29 MED ORDER — HEPARIN SOD (PORK) LOCK FLUSH 100 UNIT/ML IV SOLN
500.0000 [IU] | Freq: Once | INTRAVENOUS | Status: AC | PRN
  Administered 2011-11-29: 500 [IU]
  Filled 2011-11-29: qty 5

## 2011-11-29 MED ORDER — HEPARIN SOD (PORK) LOCK FLUSH 100 UNIT/ML IV SOLN
INTRAVENOUS | Status: AC
  Filled 2011-11-29: qty 5

## 2011-11-29 NOTE — Progress Notes (Signed)
Tolerated infusion well. 

## 2011-12-03 LAB — MULTIPLE MYELOMA PANEL, SERUM
Alpha-1-Globulin: 4.9 % (ref 2.9–4.9)
Beta 2: 5 % (ref 3.2–6.5)
Beta Globulin: 6.4 % (ref 4.7–7.2)
Gamma Globulin: 15.9 % (ref 11.1–18.8)
IgM, Serum: 24 mg/dL — ABNORMAL LOW (ref 41–251)

## 2011-12-05 DIAGNOSIS — J209 Acute bronchitis, unspecified: Secondary | ICD-10-CM | POA: Diagnosis not present

## 2011-12-17 ENCOUNTER — Telehealth (HOSPITAL_COMMUNITY): Payer: Self-pay

## 2011-12-17 NOTE — Telephone Encounter (Signed)
Message copied by Sterling Big on Tue Dec 17, 2011  1:01 PM ------      Message from: Mariel Sleet, ERIC S      Created: Tue Dec 17, 2011 12:43 PM       Just push fluids today and call us in am      His immunoglobulin levels might need to be checked again      Also who placed him on the sulfa?  Did we?

## 2011-12-17 NOTE — Telephone Encounter (Signed)
Notified to push fluids today and to call back tomorrow with update.  Was place on antibiotic by Dr. Lilyan Punt.

## 2011-12-17 NOTE — Telephone Encounter (Signed)
Call from patient with complaints of shortness of breath, head congestion, runny nose, productive cough (clear to yellowish) and  no fever x 1 month.  Had been on Sulfamethoxazole.  Was suppose to take it for 14 days but stopped after 12 days due to diarrhea.  Last dose was early last week.  Wants to know if needs to take IVIG or do something else?  Call back is [phone].

## 2011-12-20 ENCOUNTER — Other Ambulatory Visit (HOSPITAL_COMMUNITY): Payer: Self-pay | Admitting: Oncology

## 2011-12-20 DIAGNOSIS — Z8546 Personal history of malignant neoplasm of prostate: Secondary | ICD-10-CM | POA: Diagnosis not present

## 2011-12-25 ENCOUNTER — Encounter: Payer: Self-pay | Admitting: Oncology

## 2011-12-25 DIAGNOSIS — K5289 Other specified noninfective gastroenteritis and colitis: Secondary | ICD-10-CM | POA: Diagnosis not present

## 2011-12-26 ENCOUNTER — Other Ambulatory Visit (HOSPITAL_COMMUNITY): Payer: Medicare Other

## 2011-12-27 ENCOUNTER — Ambulatory Visit (HOSPITAL_COMMUNITY): Payer: Medicare Other

## 2011-12-27 DIAGNOSIS — R39198 Other difficulties with micturition: Secondary | ICD-10-CM | POA: Diagnosis not present

## 2011-12-27 DIAGNOSIS — C61 Malignant neoplasm of prostate: Secondary | ICD-10-CM | POA: Diagnosis not present

## 2011-12-27 DIAGNOSIS — N320 Bladder-neck obstruction: Secondary | ICD-10-CM | POA: Diagnosis not present

## 2011-12-30 ENCOUNTER — Encounter (HOSPITAL_COMMUNITY): Payer: Medicare Other | Attending: Oncology

## 2011-12-30 DIAGNOSIS — C8589 Other specified types of non-Hodgkin lymphoma, extranodal and solid organ sites: Secondary | ICD-10-CM

## 2011-12-30 DIAGNOSIS — C9000 Multiple myeloma not having achieved remission: Secondary | ICD-10-CM | POA: Diagnosis not present

## 2011-12-30 LAB — COMPREHENSIVE METABOLIC PANEL
ALT: 22 U/L (ref 0–53)
AST: 22 U/L (ref 0–37)
Alkaline Phosphatase: 82 U/L (ref 39–117)
CO2: 24 mEq/L (ref 19–32)
Chloride: 109 mEq/L (ref 96–112)
Creatinine, Ser: 0.85 mg/dL (ref 0.50–1.35)
GFR calc non Af Amer: 87 mL/min — ABNORMAL LOW (ref >90)
Potassium: 4 mEq/L (ref 3.5–5.1)
Total Bilirubin: 0.3 mg/dL (ref 0.3–1.2)

## 2011-12-30 LAB — CBC
Hemoglobin: 12.3 g/dL — ABNORMAL LOW (ref 13.0–17.0)
MCH: 29.6 pg (ref 26.0–34.0)
RBC: 4.15 MIL/uL — ABNORMAL LOW (ref 4.22–5.81)
WBC: 4.1 10*3/uL (ref 4.0–10.5)

## 2011-12-30 LAB — DIFFERENTIAL
Basophils Absolute: 0.1 10*3/uL (ref 0.0–0.1)
Basophils Relative: 1 % (ref 0–1)
Eosinophils Absolute: 0.2 10*3/uL (ref 0.0–0.7)
Eosinophils Relative: 5 % (ref 0–5)
Monocytes Absolute: 0.5 10*3/uL (ref 0.1–1.0)
Neutro Abs: 1.6 10*3/uL — ABNORMAL LOW (ref 1.7–7.7)

## 2011-12-30 MED ORDER — HYDROCODONE-ACETAMINOPHEN 5-325 MG PO TABS: 1.0000 | ORAL_TABLET | Freq: Three times a day (TID) | ORAL | Status: DC | PRN

## 2011-12-30 NOTE — Progress Notes (Signed)
Labs drawn today for cbc/diff,cmp,mm panel,kllc 

## 2011-12-31 ENCOUNTER — Encounter (HOSPITAL_BASED_OUTPATIENT_CLINIC_OR_DEPARTMENT_OTHER): Payer: Medicare Other

## 2011-12-31 DIAGNOSIS — C8589 Other specified types of non-Hodgkin lymphoma, extranodal and solid organ sites: Secondary | ICD-10-CM

## 2011-12-31 DIAGNOSIS — C9000 Multiple myeloma not having achieved remission: Secondary | ICD-10-CM | POA: Diagnosis not present

## 2011-12-31 MED ORDER — SODIUM CHLORIDE 0.9 % IV SOLN
Freq: Once | INTRAVENOUS | Status: AC
  Administered 2011-12-31: 10:00:00 via INTRAVENOUS

## 2011-12-31 MED ORDER — SODIUM CHLORIDE 0.9 % IJ SOLN
10.0000 mL | INTRAMUSCULAR | Status: DC | PRN
  Filled 2011-12-31: qty 10

## 2011-12-31 MED ORDER — HEPARIN SOD (PORK) LOCK FLUSH 100 UNIT/ML IV SOLN
500.0000 [IU] | Freq: Once | INTRAVENOUS | Status: AC | PRN
  Administered 2011-12-31: 500 [IU]
  Filled 2011-12-31: qty 5

## 2011-12-31 MED ORDER — ZOLEDRONIC ACID 4 MG/5ML IV CONC
4.0000 mg | Freq: Once | INTRAVENOUS | Status: AC
  Administered 2011-12-31: 4 mg via INTRAVENOUS
  Filled 2011-12-31: qty 5

## 2011-12-31 NOTE — Progress Notes (Signed)
[name] tolerated Zometa infusion well and without incident; verbalizes understanding for follow-up.  No distress noted at time of discharge and patient was discharged home with self.

## 2012-01-01 LAB — MULTIPLE MYELOMA PANEL, SERUM
Alpha-1-Globulin: 5.5 % — ABNORMAL HIGH (ref 2.9–4.9)
Alpha-2-Globulin: 11.4 % (ref 7.1–11.8)
Beta Globulin: 6.4 % (ref 4.7–7.2)
Gamma Globulin: 14 % (ref 11.1–18.8)
M-Spike, %: NOT DETECTED g/dL

## 2012-01-02 DIAGNOSIS — Z45018 Encounter for adjustment and management of other part of cardiac pacemaker: Secondary | ICD-10-CM | POA: Diagnosis not present

## 2012-01-02 DIAGNOSIS — I2510 Atherosclerotic heart disease of native coronary artery without angina pectoris: Secondary | ICD-10-CM | POA: Diagnosis not present

## 2012-01-02 DIAGNOSIS — I10 Essential (primary) hypertension: Secondary | ICD-10-CM | POA: Diagnosis not present

## 2012-01-02 DIAGNOSIS — E782 Mixed hyperlipidemia: Secondary | ICD-10-CM | POA: Diagnosis not present

## 2012-01-07 ENCOUNTER — Other Ambulatory Visit (HOSPITAL_COMMUNITY): Payer: Self-pay | Admitting: Oncology

## 2012-01-09 ENCOUNTER — Other Ambulatory Visit (HOSPITAL_COMMUNITY): Payer: Medicare Other

## 2012-01-13 DIAGNOSIS — I723 Aneurysm of iliac artery: Secondary | ICD-10-CM | POA: Diagnosis not present

## 2012-01-13 DIAGNOSIS — I70219 Atherosclerosis of native arteries of extremities with intermittent claudication, unspecified extremity: Secondary | ICD-10-CM | POA: Diagnosis not present

## 2012-01-20 ENCOUNTER — Encounter: Payer: Self-pay | Admitting: Oncology

## 2012-01-22 ENCOUNTER — Other Ambulatory Visit (HOSPITAL_COMMUNITY): Payer: Self-pay | Admitting: Oncology

## 2012-01-22 DIAGNOSIS — C9000 Multiple myeloma not having achieved remission: Secondary | ICD-10-CM

## 2012-01-22 MED ORDER — ALPRAZOLAM 0.5 MG PO TABS: 0.5000 mg | ORAL_TABLET | Freq: Every evening | ORAL | Status: DC | PRN

## 2012-02-03 DIAGNOSIS — J019 Acute sinusitis, unspecified: Secondary | ICD-10-CM | POA: Diagnosis not present

## 2012-02-03 DIAGNOSIS — J40 Bronchitis, not specified as acute or chronic: Secondary | ICD-10-CM | POA: Diagnosis not present

## 2012-02-04 ENCOUNTER — Encounter (HOSPITAL_COMMUNITY): Payer: Medicare Other | Attending: Oncology

## 2012-02-04 DIAGNOSIS — C9000 Multiple myeloma not having achieved remission: Secondary | ICD-10-CM | POA: Diagnosis not present

## 2012-02-04 LAB — COMPREHENSIVE METABOLIC PANEL
ALT: 35 U/L (ref 0–53)
BUN: 13 mg/dL (ref 6–23)
CO2: 22 mEq/L (ref 19–32)
Calcium: 8.9 mg/dL (ref 8.4–10.5)
Creatinine, Ser: 1.02 mg/dL (ref 0.50–1.35)
GFR calc Af Amer: 85 mL/min — ABNORMAL LOW (ref >90)
GFR calc non Af Amer: 73 mL/min — ABNORMAL LOW (ref >90)
Glucose, Bld: 92 mg/dL (ref 70–99)
Sodium: 131 mEq/L — ABNORMAL LOW (ref 135–145)
Total Protein: 7.1 g/dL (ref 6.0–8.3)

## 2012-02-04 LAB — CBC
HCT: 37.3 % — ABNORMAL LOW (ref 39.0–52.0)
MCH: 30.2 pg (ref 26.0–34.0)
MCV: 90.1 fL (ref 78.0–100.0)
Platelets: 85 10*3/uL — ABNORMAL LOW (ref 150–400)
RBC: 4.14 MIL/uL — ABNORMAL LOW (ref 4.22–5.81)

## 2012-02-04 LAB — DIFFERENTIAL
Eosinophils Absolute: 0 10*3/uL (ref 0.0–0.7)
Eosinophils Relative: 1 % (ref 0–5)
Lymphocytes Relative: 41 % (ref 12–46)
Lymphs Abs: 0.7 10*3/uL (ref 0.7–4.0)
Monocytes Absolute: 0.2 10*3/uL (ref 0.1–1.0)

## 2012-02-04 NOTE — Progress Notes (Signed)
[name] presented for labwork. Labs per MD order drawn via Peripheral Line 23 gauge needle inserted in right antecubital.  Good blood return present. Procedure without incident.  Needle removed intact. Patient tolerated procedure well.

## 2012-02-05 ENCOUNTER — Other Ambulatory Visit (HOSPITAL_COMMUNITY): Payer: Self-pay | Admitting: Oncology

## 2012-02-05 ENCOUNTER — Encounter (HOSPITAL_BASED_OUTPATIENT_CLINIC_OR_DEPARTMENT_OTHER): Payer: Medicare Other

## 2012-02-05 VITALS — BP 146/78 | HR 77 | Temp 98.4°F | Wt 191.0 lb

## 2012-02-05 DIAGNOSIS — C9000 Multiple myeloma not having achieved remission: Secondary | ICD-10-CM

## 2012-02-05 DIAGNOSIS — D849 Immunodeficiency, unspecified: Secondary | ICD-10-CM | POA: Diagnosis not present

## 2012-02-05 LAB — KAPPA/LAMBDA LIGHT CHAINS
Kappa free light chain: 3.9 mg/dL — ABNORMAL HIGH (ref 0.33–1.94)
Kappa, lambda light chain ratio: 1.34 (ref 0.26–1.65)
Lambda free light chains: 2.92 mg/dL — ABNORMAL HIGH (ref 0.57–2.63)

## 2012-02-05 MED ORDER — IMMUNE GLOBULIN (HUMAN) 20 GM/200ML IV SOLN
1.0000 g/kg | Freq: Once | INTRAVENOUS | Status: AC
  Administered 2012-02-05: 85 g via INTRAVENOUS
  Filled 2012-02-05: qty 850

## 2012-02-05 MED ORDER — IMMUNE GLOBULIN (HUMAN) 20 GM/200ML IV SOLN: 1.0000 g/kg | INTRAVENOUS | Status: DC

## 2012-02-05 MED ORDER — HEPARIN SOD (PORK) LOCK FLUSH 100 UNIT/ML IV SOLN
500.0000 [IU] | Freq: Once | INTRAVENOUS | Status: AC
  Administered 2012-02-05: 500 [IU] via INTRAVENOUS
  Filled 2012-02-05: qty 5

## 2012-02-05 MED ORDER — DEXTROSE 5 % IV SOLN
INTRAVENOUS | Status: DC
  Administered 2012-02-05: 50 mL via INTRAVENOUS
  Filled 2012-02-05 (×2): qty 1000

## 2012-02-05 MED ORDER — IMMUNE GLOBULIN (HUMAN) 20 GM/200ML IV SOLN
1.0000 g/kg | INTRAVENOUS | Status: DC
  Filled 2012-02-05 (×2): qty 850

## 2012-02-05 NOTE — Progress Notes (Signed)
1340 pt c/o feeling cold. He is SOB after getting up from recliner and going to rest room. States " I had to wrestle with Chair to get up".  IVIG slowed to 100 cc/hr. Vitals stable. O2 sat 97 %.   1400 resting more comfortably but still c/o feeling cold. Extra blankets given. 1500 no further c/o voiced. 1524 tolerated remainder of infusion without problems.

## 2012-02-06 ENCOUNTER — Encounter (HOSPITAL_BASED_OUTPATIENT_CLINIC_OR_DEPARTMENT_OTHER): Payer: Medicare Other

## 2012-02-06 VITALS — BP 118/73 | HR 61 | Temp 97.4°F

## 2012-02-06 DIAGNOSIS — D849 Immunodeficiency, unspecified: Secondary | ICD-10-CM

## 2012-02-06 DIAGNOSIS — C9000 Multiple myeloma not having achieved remission: Secondary | ICD-10-CM | POA: Diagnosis not present

## 2012-02-06 MED ORDER — SODIUM CHLORIDE 0.9 % IJ SOLN
10.0000 mL | INTRAMUSCULAR | Status: DC | PRN
  Administered 2012-02-06: 10 mL via INTRAVENOUS
  Filled 2012-02-06: qty 10

## 2012-02-06 MED ORDER — HEPARIN SOD (PORK) LOCK FLUSH 100 UNIT/ML IV SOLN
INTRAVENOUS | Status: AC
  Filled 2012-02-06: qty 5

## 2012-02-06 MED ORDER — IMMUNE GLOBULIN (HUMAN) 20 GM/200ML IV SOLN
1.0000 g/kg | Freq: Once | INTRAVENOUS | Status: AC
  Administered 2012-02-06: 85 g via INTRAVENOUS
  Filled 2012-02-06: qty 850

## 2012-02-06 MED ORDER — SODIUM CHLORIDE 0.9 % IJ SOLN
INTRAMUSCULAR | Status: AC
  Filled 2012-02-06: qty 10

## 2012-02-06 MED ORDER — HEPARIN SOD (PORK) LOCK FLUSH 100 UNIT/ML IV SOLN
500.0000 [IU] | Freq: Once | INTRAVENOUS | Status: AC
  Administered 2012-02-06: 500 [IU] via INTRAVENOUS
  Filled 2012-02-06: qty 5

## 2012-02-06 MED ORDER — SODIUM CHLORIDE 0.9 % IV SOLN
INTRAVENOUS | Status: DC
  Administered 2012-02-06: 250 mL via INTRAVENOUS

## 2012-02-06 NOTE — Progress Notes (Signed)
Jeremy Parrish tolerated infusion well and without incident, states he feels much better than he did yesterday.  Patient verbalizes understanding for follow-up.  No distress noted at time of discharge and patient was discharged home by himself.

## 2012-02-07 LAB — MULTIPLE MYELOMA PANEL, SERUM
Gamma Globulin: 12.4 % (ref 11.1–18.8)
IgA: 241 mg/dL (ref 68–379)
IgG (Immunoglobin G), Serum: 830 mg/dL (ref 650–1600)
M-Spike, %: NOT DETECTED g/dL
Total Protein: 6.7 g/dL (ref 6.0–8.3)

## 2012-02-11 ENCOUNTER — Other Ambulatory Visit (HOSPITAL_COMMUNITY): Payer: Medicare Other

## 2012-02-12 ENCOUNTER — Encounter (HOSPITAL_COMMUNITY): Payer: Medicare Other | Attending: Oncology

## 2012-02-12 VITALS — BP 132/69 | HR 69 | Temp 97.0°F

## 2012-02-12 DIAGNOSIS — R05 Cough: Secondary | ICD-10-CM | POA: Insufficient documentation

## 2012-02-12 DIAGNOSIS — C9000 Multiple myeloma not having achieved remission: Secondary | ICD-10-CM | POA: Insufficient documentation

## 2012-02-12 DIAGNOSIS — J479 Bronchiectasis, uncomplicated: Secondary | ICD-10-CM | POA: Insufficient documentation

## 2012-02-12 DIAGNOSIS — C8589 Other specified types of non-Hodgkin lymphoma, extranodal and solid organ sites: Secondary | ICD-10-CM | POA: Insufficient documentation

## 2012-02-12 DIAGNOSIS — R059 Cough, unspecified: Secondary | ICD-10-CM | POA: Insufficient documentation

## 2012-02-12 MED ORDER — HEPARIN SOD (PORK) LOCK FLUSH 100 UNIT/ML IV SOLN
INTRAVENOUS | Status: AC
  Filled 2012-02-12: qty 5

## 2012-02-12 MED ORDER — ZOLEDRONIC ACID 4 MG/5ML IV CONC
4.0000 mg | Freq: Once | INTRAVENOUS | Status: AC
  Administered 2012-02-12: 4 mg via INTRAVENOUS
  Filled 2012-02-12: qty 5

## 2012-02-12 MED ORDER — SODIUM CHLORIDE 0.9 % IV SOLN
Freq: Once | INTRAVENOUS | Status: AC
  Administered 2012-02-12: 500 mL via INTRAVENOUS

## 2012-02-12 MED ORDER — SODIUM CHLORIDE 0.9 % IJ SOLN
INTRAMUSCULAR | Status: AC
  Filled 2012-02-12: qty 10

## 2012-02-12 MED ORDER — SODIUM CHLORIDE 0.9 % IJ SOLN
10.0000 mL | INTRAMUSCULAR | Status: DC | PRN
  Administered 2012-02-12: 10 mL
  Filled 2012-02-12: qty 10

## 2012-02-12 MED ORDER — HEPARIN SOD (PORK) LOCK FLUSH 100 UNIT/ML IV SOLN
500.0000 [IU] | Freq: Once | INTRAVENOUS | Status: AC | PRN
  Administered 2012-02-12: 500 [IU]
  Filled 2012-02-12: qty 5

## 2012-02-12 NOTE — Progress Notes (Signed)
Jeremy Parrish tolerated infusion well and without incident; verbalizes understanding for follow-up.  No distress noted at time of discharge and patient was discharged home by himself.  

## 2012-02-14 ENCOUNTER — Encounter (HOSPITAL_BASED_OUTPATIENT_CLINIC_OR_DEPARTMENT_OTHER): Payer: Medicare Other | Admitting: Oncology

## 2012-02-14 VITALS — BP 135/73 | HR 84 | Temp 97.9°F | Wt 190.9 lb

## 2012-02-14 DIAGNOSIS — J479 Bronchiectasis, uncomplicated: Secondary | ICD-10-CM | POA: Diagnosis not present

## 2012-02-14 DIAGNOSIS — C8589 Other specified types of non-Hodgkin lymphoma, extranodal and solid organ sites: Secondary | ICD-10-CM | POA: Diagnosis not present

## 2012-02-14 DIAGNOSIS — C9001 Multiple myeloma in remission: Secondary | ICD-10-CM

## 2012-02-14 DIAGNOSIS — C9000 Multiple myeloma not having achieved remission: Secondary | ICD-10-CM

## 2012-02-14 NOTE — Progress Notes (Signed)
This office note has been dictated.

## 2012-02-14 NOTE — Progress Notes (Signed)
CC:   Jeremy A. Gerda Diss, MD Jeremy Carol, MD Jeremy Parrish. Jeremy Parrish, M.D. Jeremy Parrish, M.D. Jeremy Parrish, M.D.  DIAGNOSES: 1. IgG lambda multiple myeloma, status post 4 cycles of dexamethasone     and Velcade, followed by autologous stem cell transplant, thus far     without recurrence. 2. Non-Hodgkin lymphoma, stage IVB, low-grade B-cell type, symptomatic     at presentation July 2002, treated with R-CHOP x6 cycles with an     excellent response and PET scan in June 2012 was negative. 3. History of prostate cancer, status post radical prostatectomy in     2002 by Dr. Vic Blackbird. 4. Chronic obstructive pulmonary disease with what sounds like     bronchiectasis.  We diagnosed that in 2011.  He quit smoking in     1995, but smoked for many, many years. 5. Peripheral neuropathy, grade 1, much improved now. 6. Coronary artery disease, status post 5 stents placed in the past. 7. Osteoporosis on therapy. 8. Bronchopneumonia in December 2012, requiring intravenous     immunoglobulin, which we also repeated last week in light of this     bronchitis, which has been very difficult for him to get rid of.     We repeated it actually on 03/27 and 02/06/2012 at 1 g/kg of IVIG. 9. History of skin cancer of the right ear, status post resection by     Dr. Park Liter. 10.Bilateral Dupuytren palmar fibromatosis, for which he has seen Dr.     Laroy Apple. Jeremy Parrish can hardly sleep at night because of the coughing.  It is sometimes greenish-yellow.  He does not have fevers now, but he had fevers last week, low-grade.  He is on Avelox and is about to finish that in the next 2 or 3 days.  As I mentioned, we did give him IVIG at the beginning of this issue.  He really does not feel he is a lot better since receiving it.  I actually drew his labs just before starting the IVIG and interestingly his immunoglobulin levels showed an IgG of 830, an IgA level of 241, and his IgM level was 18, so that was  specifically very low at that time. The other 2 were in the normal range.  We had to give him IVIG, however, back in early to mid February as well for a bad infection, complicated by fever and the need for several antibiotics at that time.  PHYSICAL EXAMINATION:  Vital Signs:  He remains again as I mentioned afebrile today.  His weight is 190 pounds, blood pressure is 135/73 in left arm sitting position.  His pulse is 80 and regular, respirations 16 to 18 and unlabored at rest.  He is in no acute distress.  He is afebrile at 97.9 degrees orally.  Lymph Nodes:  Negative throughout. Abdomen:  He has no hepatosplenomegaly.  Lungs:  He has rales at the right lung base, which do not clear with coughing, both laterally and posteriorly.  The left lung base had a few rales which cleared with coughing.  His lungs show rhonchi bilaterally, however, more on the right than the left.  Heart:  Shows no S3 gallop or murmur.  Abdomen: Soft and nontender without hepatosplenomegaly.  Extremities:  He has no leg edema.  Skin:  Warm and dry to the touch.  Neurologic:  He is alert and oriented.  I am concerned that his bronchiectasis may be becoming more and more of an issue.  He is in  remission from both his myeloma and his non-Hodgkin lymphoma, but he does have an inadequate immune system and he has a very longstanding history of COPD.  So I am going to repeat a CT scan of the chest, which has not been done for couple of years to see if his bronchiectasis is worse and then probably let him revisit Dr. Odis Luster at [facility] to see if he has any other thoughts.  In the meantime, I will give him some Tussionex for his cough at night because he is not sleeping well at all.  We will see if that helps.    ______________________________ Ladona Horns. Mariel Sleet, MD ESN/MEDQ  D:  02/14/2012  T:  02/14/2012  Job:  [id number]

## 2012-02-14 NOTE — Patient Instructions (Signed)
Jeremy Parrish  [id number] Aug 23, 1941   [facility]  Discharge Instructions  RECOMMENDATIONS MADE BY THE CONSULTANT AND ANY TEST RESULTS WILL BE SENT TO YOUR REFERRING DOCTOR.   EXAM FINDINGS BY MD TODAY AND SIGNS AND SYMPTOMS TO REPORT TO CLINIC OR PRIMARY MD: MD thinks cough is related to your lungs.  Does not thing you have an infection.  MEDICATIONS PRESCRIBED: Tussionex:  Take 1 teaspoonful at bedtime as needed for cough. May cause drowsiness, do not operate machinery while taking this medication  INSTRUCTIONS GIVEN AND DISCUSSED: Other :  Report fever, chills, night sweats or other problems.  SPECIAL INSTRUCTIONS/FOLLOW-UP: Xray Studies Needed CT of chest next week and need follow-up with Dr. Odis Luster and Return to Clinic as scheduled.   I acknowledge that I have been informed and understand all the instructions given to me and received a copy. I do not have any more questions at this time, but understand that I may call the Specialty Clinic at [facility] at [phone] during business hours should I have any further questions or need assistance in obtaining follow-up care.    __________________________________________  _____________  __________ Signature of Patient or Authorized Representative            Date                   Time    __________________________________________ Nurse's Signature

## 2012-02-17 ENCOUNTER — Encounter (HOSPITAL_COMMUNITY): Payer: Self-pay

## 2012-02-17 ENCOUNTER — Telehealth (HOSPITAL_COMMUNITY): Payer: Self-pay | Admitting: Oncology

## 2012-02-17 ENCOUNTER — Ambulatory Visit (HOSPITAL_COMMUNITY)
Admission: RE | Admit: 2012-02-17 | Discharge: 2012-02-17 | Disposition: A | Discharge status: Home / Self Care | Payer: Medicare Other | Source: Ambulatory Visit | Attending: Oncology | Admitting: Oncology

## 2012-02-17 DIAGNOSIS — R918 Other nonspecific abnormal finding of lung field: Secondary | ICD-10-CM | POA: Insufficient documentation

## 2012-02-17 DIAGNOSIS — C9000 Multiple myeloma not having achieved remission: Secondary | ICD-10-CM | POA: Diagnosis not present

## 2012-02-17 DIAGNOSIS — J479 Bronchiectasis, uncomplicated: Secondary | ICD-10-CM | POA: Insufficient documentation

## 2012-02-17 DIAGNOSIS — C61 Malignant neoplasm of prostate: Secondary | ICD-10-CM | POA: Diagnosis not present

## 2012-02-20 ENCOUNTER — Other Ambulatory Visit (HOSPITAL_COMMUNITY): Payer: Medicare Other

## 2012-02-26 DIAGNOSIS — J479 Bronchiectasis, uncomplicated: Secondary | ICD-10-CM | POA: Diagnosis not present

## 2012-02-27 ENCOUNTER — Other Ambulatory Visit (HOSPITAL_COMMUNITY): Payer: Self-pay | Admitting: Oncology

## 2012-02-27 DIAGNOSIS — C9000 Multiple myeloma not having achieved remission: Secondary | ICD-10-CM

## 2012-02-27 MED ORDER — LENALIDOMIDE 10 MG PO CAPS: ORAL_CAPSULE | ORAL | Status: DC

## 2012-02-28 ENCOUNTER — Telehealth (HOSPITAL_COMMUNITY): Payer: Self-pay | Admitting: Oncology

## 2012-02-28 DIAGNOSIS — R05 Cough: Secondary | ICD-10-CM | POA: Diagnosis not present

## 2012-02-28 DIAGNOSIS — I10 Essential (primary) hypertension: Secondary | ICD-10-CM | POA: Diagnosis not present

## 2012-02-28 DIAGNOSIS — J479 Bronchiectasis, uncomplicated: Secondary | ICD-10-CM | POA: Diagnosis not present

## 2012-02-28 DIAGNOSIS — Z950 Presence of cardiac pacemaker: Secondary | ICD-10-CM | POA: Diagnosis not present

## 2012-02-28 DIAGNOSIS — I2510 Atherosclerotic heart disease of native coronary artery without angina pectoris: Secondary | ICD-10-CM | POA: Diagnosis not present

## 2012-02-28 DIAGNOSIS — R059 Cough, unspecified: Secondary | ICD-10-CM | POA: Diagnosis not present

## 2012-02-28 DIAGNOSIS — J449 Chronic obstructive pulmonary disease, unspecified: Secondary | ICD-10-CM | POA: Diagnosis not present

## 2012-02-28 DIAGNOSIS — E7800 Pure hypercholesterolemia, unspecified: Secondary | ICD-10-CM | POA: Diagnosis not present

## 2012-03-02 ENCOUNTER — Telehealth (HOSPITAL_COMMUNITY): Payer: Self-pay | Admitting: *Deleted

## 2012-03-09 ENCOUNTER — Other Ambulatory Visit (HOSPITAL_COMMUNITY): Payer: Self-pay | Admitting: Oncology

## 2012-03-09 ENCOUNTER — Encounter (HOSPITAL_BASED_OUTPATIENT_CLINIC_OR_DEPARTMENT_OTHER): Payer: Medicare Other

## 2012-03-09 DIAGNOSIS — C8590 Non-Hodgkin lymphoma, unspecified, unspecified site: Secondary | ICD-10-CM

## 2012-03-09 DIAGNOSIS — J479 Bronchiectasis, uncomplicated: Secondary | ICD-10-CM | POA: Diagnosis not present

## 2012-03-09 DIAGNOSIS — C9000 Multiple myeloma not having achieved remission: Secondary | ICD-10-CM

## 2012-03-09 DIAGNOSIS — C8589 Other specified types of non-Hodgkin lymphoma, extranodal and solid organ sites: Secondary | ICD-10-CM | POA: Diagnosis not present

## 2012-03-09 DIAGNOSIS — E876 Hypokalemia: Secondary | ICD-10-CM

## 2012-03-09 DIAGNOSIS — R05 Cough: Secondary | ICD-10-CM

## 2012-03-09 DIAGNOSIS — R059 Cough, unspecified: Secondary | ICD-10-CM | POA: Diagnosis not present

## 2012-03-09 LAB — COMPREHENSIVE METABOLIC PANEL
ALT: 21 U/L (ref 0–53)
Alkaline Phosphatase: 65 U/L (ref 39–117)
CO2: 27 mEq/L (ref 19–32)
Chloride: 104 mEq/L (ref 96–112)
GFR calc Af Amer: >90 mL/min (ref >90)
GFR calc non Af Amer: >90 mL/min (ref >90)
Glucose, Bld: 84 mg/dL (ref 70–99)
Potassium: 3.6 mEq/L (ref 3.5–5.1)
Sodium: 138 mEq/L (ref 135–145)
Total Bilirubin: 0.4 mg/dL (ref 0.3–1.2)

## 2012-03-09 LAB — DIFFERENTIAL
Eosinophils Relative: 7 % — ABNORMAL HIGH (ref 0–5)
Lymphocytes Relative: 35 % (ref 12–46)
Lymphs Abs: 1 10*3/uL (ref 0.7–4.0)
Neutro Abs: 1.4 10*3/uL — ABNORMAL LOW (ref 1.7–7.7)
Neutrophils Relative %: 48 % (ref 43–77)

## 2012-03-09 LAB — CBC
MCV: 90.2 fL (ref 78.0–100.0)
Platelets: 111 10*3/uL — ABNORMAL LOW (ref 150–400)
RBC: 4.19 MIL/uL — ABNORMAL LOW (ref 4.22–5.81)
WBC: 2.9 10*3/uL — ABNORMAL LOW (ref 4.0–10.5)

## 2012-03-09 MED ORDER — ETODOLAC 300 MG PO CAPS: 300.0000 mg | ORAL_CAPSULE | Freq: Three times a day (TID) | ORAL | Status: DC

## 2012-03-09 MED ORDER — POTASSIUM CHLORIDE CRYS ER 20 MEQ PO TBCR: 20.0000 meq | EXTENDED_RELEASE_TABLET | Freq: Two times a day (BID) | ORAL | Status: DC

## 2012-03-09 NOTE — Progress Notes (Signed)
Lab draw

## 2012-03-10 ENCOUNTER — Telehealth (HOSPITAL_COMMUNITY): Payer: Self-pay | Admitting: *Deleted

## 2012-03-10 ENCOUNTER — Telehealth (HOSPITAL_COMMUNITY): Payer: Self-pay | Admitting: Oncology

## 2012-03-10 ENCOUNTER — Other Ambulatory Visit (HOSPITAL_COMMUNITY): Payer: Self-pay | Admitting: Oncology

## 2012-03-10 NOTE — Telephone Encounter (Signed)
Ninety-day supply of Gabapentin 600mg  called in to [facility] for patient per T. Kefalas, PA-C.

## 2012-03-11 ENCOUNTER — Encounter (HOSPITAL_COMMUNITY): Payer: Medicare Other | Attending: Oncology

## 2012-03-11 VITALS — BP 114/74 | HR 61 | Temp 97.4°F

## 2012-03-11 DIAGNOSIS — C9000 Multiple myeloma not having achieved remission: Secondary | ICD-10-CM | POA: Insufficient documentation

## 2012-03-11 DIAGNOSIS — C8589 Other specified types of non-Hodgkin lymphoma, extranodal and solid organ sites: Secondary | ICD-10-CM | POA: Insufficient documentation

## 2012-03-11 LAB — MULTIPLE MYELOMA PANEL, SERUM
Alpha-1-Globulin: 4.5 % (ref 2.9–4.9)
Alpha-2-Globulin: 10.2 % (ref 7.1–11.8)
Beta Globulin: 6.4 % (ref 4.7–7.2)
Gamma Globulin: 19.1 % — ABNORMAL HIGH (ref 11.1–18.8)
M-Spike, %: NOT DETECTED g/dL

## 2012-03-11 MED ORDER — ZOLEDRONIC ACID 4 MG/5ML IV CONC
4.0000 mg | Freq: Once | INTRAVENOUS | Status: AC
  Administered 2012-03-11: 4 mg via INTRAVENOUS
  Filled 2012-03-11: qty 5

## 2012-03-11 MED ORDER — SODIUM CHLORIDE 0.9 % IJ SOLN
INTRAMUSCULAR | Status: AC
  Filled 2012-03-11: qty 10

## 2012-03-11 MED ORDER — SODIUM CHLORIDE 0.9 % IJ SOLN
10.0000 mL | Freq: Once | INTRAMUSCULAR | Status: AC
  Administered 2012-03-11: 10 mL via INTRAVENOUS
  Filled 2012-03-11: qty 10

## 2012-03-11 MED ORDER — HEPARIN SOD (PORK) LOCK FLUSH 100 UNIT/ML IV SOLN
500.0000 [IU] | Freq: Once | INTRAVENOUS | Status: AC | PRN
  Administered 2012-03-11: 500 [IU]
  Filled 2012-03-11: qty 5

## 2012-03-11 MED ORDER — HEPARIN SOD (PORK) LOCK FLUSH 100 UNIT/ML IV SOLN
INTRAVENOUS | Status: AC
  Filled 2012-03-11: qty 5

## 2012-03-11 MED ORDER — SODIUM CHLORIDE 0.9 % IV SOLN
Freq: Once | INTRAVENOUS | Status: AC
  Administered 2012-03-11: 250 mL via INTRAVENOUS

## 2012-03-11 MED ORDER — HYDROCODONE-ACETAMINOPHEN 5-325 MG PO TABS: 1.0000 | ORAL_TABLET | Freq: Three times a day (TID) | ORAL | Status: DC | PRN

## 2012-03-11 NOTE — Progress Notes (Signed)
Jeremy Parrish tolerated infusion well and without incident; verbalizes understanding for follow-up.  No distress noted at time of discharge and patient was discharged home by himself.  

## 2012-03-26 DIAGNOSIS — L570 Actinic keratosis: Secondary | ICD-10-CM | POA: Diagnosis not present

## 2012-03-26 DIAGNOSIS — D1801 Hemangioma of skin and subcutaneous tissue: Secondary | ICD-10-CM | POA: Diagnosis not present

## 2012-03-26 DIAGNOSIS — D485 Neoplasm of uncertain behavior of skin: Secondary | ICD-10-CM | POA: Diagnosis not present

## 2012-03-26 DIAGNOSIS — Z85828 Personal history of other malignant neoplasm of skin: Secondary | ICD-10-CM | POA: Diagnosis not present

## 2012-04-02 ENCOUNTER — Encounter: Payer: Self-pay | Admitting: Oncology

## 2012-04-03 LAB — FUNGUS CULTURE W SMEAR

## 2012-04-07 ENCOUNTER — Encounter (HOSPITAL_COMMUNITY): Payer: Medicare Other

## 2012-04-07 ENCOUNTER — Telehealth (HOSPITAL_COMMUNITY): Payer: Self-pay | Admitting: *Deleted

## 2012-04-07 ENCOUNTER — Encounter (HOSPITAL_COMMUNITY): Payer: Self-pay | Admitting: *Deleted

## 2012-04-07 DIAGNOSIS — C9000 Multiple myeloma not having achieved remission: Secondary | ICD-10-CM

## 2012-04-07 DIAGNOSIS — C8589 Other specified types of non-Hodgkin lymphoma, extranodal and solid organ sites: Secondary | ICD-10-CM | POA: Diagnosis not present

## 2012-04-07 LAB — COMPREHENSIVE METABOLIC PANEL
AST: 26 U/L (ref 0–37)
Albumin: 3.7 g/dL (ref 3.5–5.2)
Alkaline Phosphatase: 66 U/L (ref 39–117)
CO2: 22 mEq/L (ref 19–32)
Chloride: 106 mEq/L (ref 96–112)
Creatinine, Ser: 0.9 mg/dL (ref 0.50–1.35)
GFR calc non Af Amer: 85 mL/min — ABNORMAL LOW (ref >90)
Potassium: 3.8 mEq/L (ref 3.5–5.1)
Total Bilirubin: 0.7 mg/dL (ref 0.3–1.2)

## 2012-04-07 NOTE — Telephone Encounter (Signed)
Pt in to day for labs. States he had fever 100.3 last night. Having labs today. He had bottles with him requesting refill on cipro 750 mg or levaquin and refill on pain pill etodolac 300 mg 1 q 8hrs. States he knows fever/respiratory symptoms are starting again. Has not heard from sputum specimen from a week ago.   

## 2012-04-07 NOTE — Telephone Encounter (Signed)
Pt notified to call me in the morning with temps. He would like refill on his pain medicine (lodine)Take 1 capsule (300 mg total) by mouth every 8 (eight) hours.

## 2012-04-07 NOTE — Progress Notes (Signed)
See telephone encounter.

## 2012-04-07 NOTE — Progress Notes (Signed)
Pt in to day for labs. States he had fever 100.3 last night. Having labs today. He had bottles with him requesting refill on cipro 750 mg or levaquin and refill on pain pill etodolac 300 mg 1 q 8hrs. States he knows fever/respiratory symptoms are starting again. Has not heard from sputum specimen from a week ago.

## 2012-04-08 ENCOUNTER — Telehealth (HOSPITAL_COMMUNITY): Payer: Self-pay | Admitting: *Deleted

## 2012-04-08 ENCOUNTER — Encounter (HOSPITAL_BASED_OUTPATIENT_CLINIC_OR_DEPARTMENT_OTHER): Payer: Medicare Other

## 2012-04-08 ENCOUNTER — Other Ambulatory Visit (HOSPITAL_COMMUNITY): Payer: Self-pay | Admitting: *Deleted

## 2012-04-08 ENCOUNTER — Ambulatory Visit (HOSPITAL_COMMUNITY): Payer: Medicare Other

## 2012-04-08 DIAGNOSIS — C9000 Multiple myeloma not having achieved remission: Secondary | ICD-10-CM | POA: Diagnosis not present

## 2012-04-08 LAB — DIFFERENTIAL
Basophils Relative: 0 % (ref 0–1)
Eosinophils Absolute: 0.1 10*3/uL (ref 0.0–0.7)
Eosinophils Relative: 4 % (ref 0–5)
Lymphs Abs: 0.7 10*3/uL (ref 0.7–4.0)
Monocytes Relative: 6 % (ref 3–12)

## 2012-04-08 LAB — CBC
HCT: 34.5 % — ABNORMAL LOW (ref 39.0–52.0)
Hemoglobin: 11.4 g/dL — ABNORMAL LOW (ref 13.0–17.0)
MCHC: 33 g/dL (ref 30.0–36.0)
RBC: 3.8 MIL/uL — ABNORMAL LOW (ref 4.22–5.81)
WBC: 2.3 10*3/uL — ABNORMAL LOW (ref 4.0–10.5)

## 2012-04-08 NOTE — Telephone Encounter (Signed)
Pt scheduled for IVIG 5/30 and 5/31

## 2012-04-08 NOTE — Progress Notes (Signed)
Avelox 400 mg # 10 [facility] 

## 2012-04-08 NOTE — Progress Notes (Signed)
Labs drawn today for mm panel, kllc, cbc/diff

## 2012-04-09 ENCOUNTER — Ambulatory Visit (HOSPITAL_COMMUNITY): Payer: Medicare Other

## 2012-04-09 ENCOUNTER — Encounter (HOSPITAL_BASED_OUTPATIENT_CLINIC_OR_DEPARTMENT_OTHER): Payer: Medicare Other

## 2012-04-09 VITALS — BP 102/63 | HR 67 | Temp 97.7°F | Wt 193.2 lb

## 2012-04-09 DIAGNOSIS — D849 Immunodeficiency, unspecified: Secondary | ICD-10-CM

## 2012-04-09 DIAGNOSIS — C9000 Multiple myeloma not having achieved remission: Secondary | ICD-10-CM

## 2012-04-09 DIAGNOSIS — C8589 Other specified types of non-Hodgkin lymphoma, extranodal and solid organ sites: Secondary | ICD-10-CM

## 2012-04-09 LAB — KAPPA/LAMBDA LIGHT CHAINS
Kappa, lambda light chain ratio: 1.41 (ref 0.26–1.65)
Lambda free light chains: 1.76 mg/dL (ref 0.57–2.63)

## 2012-04-09 MED ORDER — ZOLEDRONIC ACID 4 MG/5ML IV CONC
4.0000 mg | Freq: Once | INTRAVENOUS | Status: AC
  Administered 2012-04-09: 4 mg via INTRAVENOUS
  Filled 2012-04-09: qty 5

## 2012-04-09 MED ORDER — SODIUM CHLORIDE 0.9 % IJ SOLN
INTRAMUSCULAR | Status: AC
  Filled 2012-04-09: qty 10

## 2012-04-09 MED ORDER — HEPARIN SOD (PORK) LOCK FLUSH 100 UNIT/ML IV SOLN
INTRAVENOUS | Status: AC
  Filled 2012-04-09: qty 5

## 2012-04-09 MED ORDER — SODIUM CHLORIDE 0.9 % IJ SOLN
10.0000 mL | INTRAMUSCULAR | Status: DC | PRN
  Filled 2012-04-09: qty 10

## 2012-04-09 MED ORDER — SODIUM CHLORIDE 0.9 % IV SOLN
Freq: Once | INTRAVENOUS | Status: AC
  Administered 2012-04-09: 500 mL via INTRAVENOUS

## 2012-04-09 MED ORDER — HEPARIN SOD (PORK) LOCK FLUSH 100 UNIT/ML IV SOLN
500.0000 [IU] | Freq: Once | INTRAVENOUS | Status: AC | PRN
  Administered 2012-04-09: 500 [IU]
  Filled 2012-04-09: qty 5

## 2012-04-09 MED ORDER — IMMUNE GLOBULIN (HUMAN) 10 GM/100ML IV SOLN: 1.0000 g/kg | Freq: Once | INTRAVENOUS | Status: DC

## 2012-04-09 MED ORDER — IMMUNE GLOBULIN (HUMAN) 20 GM/200ML IV SOLN
80.0000 g | Freq: Once | INTRAVENOUS | Status: DC
  Filled 2012-04-09: qty 800

## 2012-04-09 MED ORDER — IMMUNE GLOBULIN (HUMAN) 10 GM/100ML IV SOLN
10.0000 g | Freq: Once | INTRAVENOUS | Status: AC
  Administered 2012-04-09: 0.1 g via INTRAVENOUS
  Filled 2012-04-09: qty 100

## 2012-04-10 ENCOUNTER — Encounter (HOSPITAL_BASED_OUTPATIENT_CLINIC_OR_DEPARTMENT_OTHER): Payer: Medicare Other

## 2012-04-10 VITALS — BP 127/66 | HR 64 | Temp 98.0°F | Wt 193.0 lb

## 2012-04-10 DIAGNOSIS — C9000 Multiple myeloma not having achieved remission: Secondary | ICD-10-CM | POA: Diagnosis not present

## 2012-04-10 DIAGNOSIS — D849 Immunodeficiency, unspecified: Secondary | ICD-10-CM | POA: Diagnosis not present

## 2012-04-10 LAB — MULTIPLE MYELOMA PANEL, SERUM
Albumin ELP: 57.2 % (ref 55.8–66.1)
Beta 2: 4.9 % (ref 3.2–6.5)
Beta Globulin: 5.7 % (ref 4.7–7.2)
Gamma Globulin: 14.8 % (ref 11.1–18.8)
IgM, Serum: 18 mg/dL — ABNORMAL LOW (ref 41–251)
M-Spike, %: 0.35 g/dL

## 2012-04-10 MED ORDER — HEPARIN SOD (PORK) LOCK FLUSH 100 UNIT/ML IV SOLN
500.0000 [IU] | Freq: Once | INTRAVENOUS | Status: AC
  Administered 2012-04-10: 500 [IU] via INTRAVENOUS
  Filled 2012-04-10: qty 5

## 2012-04-10 MED ORDER — DEXTROSE 5 % IV SOLN
INTRAVENOUS | Status: DC
  Administered 2012-04-10: 08:00:00 via INTRAVENOUS
  Filled 2012-04-10 (×2): qty 1000

## 2012-04-10 MED ORDER — SODIUM CHLORIDE 0.9 % IJ SOLN
10.0000 mL | Freq: Once | INTRAMUSCULAR | Status: AC
  Administered 2012-04-10: 10 mL via INTRAVENOUS
  Filled 2012-04-10: qty 10

## 2012-04-10 MED ORDER — IMMUNE GLOBULIN (HUMAN) 10 GM/100ML IV SOLN
1.0000 g/kg | Freq: Once | INTRAVENOUS | Status: AC
  Administered 2012-04-10: 90 g via INTRAVENOUS
  Filled 2012-04-10: qty 900

## 2012-04-10 MED ORDER — HEPARIN SOD (PORK) LOCK FLUSH 100 UNIT/ML IV SOLN
INTRAVENOUS | Status: AC
  Filled 2012-04-10: qty 5

## 2012-04-10 NOTE — Progress Notes (Signed)
Tolerated infusion well. 

## 2012-04-13 ENCOUNTER — Telehealth (HOSPITAL_COMMUNITY): Payer: Self-pay | Admitting: *Deleted

## 2012-04-13 ENCOUNTER — Other Ambulatory Visit (HOSPITAL_COMMUNITY): Payer: Self-pay | Admitting: Oncology

## 2012-04-13 DIAGNOSIS — G4700 Insomnia, unspecified: Secondary | ICD-10-CM

## 2012-04-13 DIAGNOSIS — C9000 Multiple myeloma not having achieved remission: Secondary | ICD-10-CM

## 2012-04-13 MED ORDER — LENALIDOMIDE 10 MG PO CAPS: ORAL_CAPSULE | ORAL | Status: DC

## 2012-04-13 MED ORDER — TEMAZEPAM 30 MG PO CAPS: 30.0000 mg | ORAL_CAPSULE | Freq: Every evening | ORAL | Status: DC | PRN

## 2012-04-15 DIAGNOSIS — R0989 Other specified symptoms and signs involving the circulatory and respiratory systems: Secondary | ICD-10-CM | POA: Diagnosis not present

## 2012-04-15 DIAGNOSIS — J479 Bronchiectasis, uncomplicated: Secondary | ICD-10-CM | POA: Diagnosis not present

## 2012-04-17 DIAGNOSIS — R51 Headache: Secondary | ICD-10-CM | POA: Diagnosis not present

## 2012-04-17 DIAGNOSIS — M542 Cervicalgia: Secondary | ICD-10-CM | POA: Diagnosis not present

## 2012-04-21 LAB — AFB CULTURE WITH SMEAR (NOT AT ARMC)

## 2012-04-22 DIAGNOSIS — I10 Essential (primary) hypertension: Secondary | ICD-10-CM | POA: Diagnosis not present

## 2012-04-22 DIAGNOSIS — I2510 Atherosclerotic heart disease of native coronary artery without angina pectoris: Secondary | ICD-10-CM | POA: Diagnosis not present

## 2012-04-22 DIAGNOSIS — E782 Mixed hyperlipidemia: Secondary | ICD-10-CM | POA: Diagnosis not present

## 2012-04-27 DIAGNOSIS — R39198 Other difficulties with micturition: Secondary | ICD-10-CM | POA: Diagnosis not present

## 2012-05-01 DIAGNOSIS — Z09 Encounter for follow-up examination after completed treatment for conditions other than malignant neoplasm: Secondary | ICD-10-CM | POA: Diagnosis not present

## 2012-05-01 DIAGNOSIS — H43819 Vitreous degeneration, unspecified eye: Secondary | ICD-10-CM | POA: Diagnosis not present

## 2012-05-01 DIAGNOSIS — H4310 Vitreous hemorrhage, unspecified eye: Secondary | ICD-10-CM | POA: Diagnosis not present

## 2012-05-01 DIAGNOSIS — H33329 Round hole, unspecified eye: Secondary | ICD-10-CM | POA: Diagnosis not present

## 2012-05-06 ENCOUNTER — Encounter (HOSPITAL_COMMUNITY): Payer: Medicare Other | Attending: Oncology

## 2012-05-06 DIAGNOSIS — C9000 Multiple myeloma not having achieved remission: Secondary | ICD-10-CM | POA: Insufficient documentation

## 2012-05-06 DIAGNOSIS — E782 Mixed hyperlipidemia: Secondary | ICD-10-CM | POA: Diagnosis not present

## 2012-05-06 DIAGNOSIS — Z79899 Other long term (current) drug therapy: Secondary | ICD-10-CM | POA: Diagnosis not present

## 2012-05-06 DIAGNOSIS — C8589 Other specified types of non-Hodgkin lymphoma, extranodal and solid organ sites: Secondary | ICD-10-CM | POA: Diagnosis not present

## 2012-05-06 LAB — COMPREHENSIVE METABOLIC PANEL
Albumin: 3.6 g/dL (ref 3.5–5.2)
Alkaline Phosphatase: 65 U/L (ref 39–117)
BUN: 13 mg/dL (ref 6–23)
Chloride: 103 mEq/L (ref 96–112)
Creatinine, Ser: 0.87 mg/dL (ref 0.50–1.35)
GFR calc Af Amer: >90 mL/min (ref >90)
Glucose, Bld: 104 mg/dL — ABNORMAL HIGH (ref 70–99)
Total Bilirubin: 0.4 mg/dL (ref 0.3–1.2)
Total Protein: 7.7 g/dL (ref 6.0–8.3)

## 2012-05-06 LAB — DIFFERENTIAL
Basophils Absolute: 0 10*3/uL (ref 0.0–0.1)
Lymphocytes Relative: 34 % (ref 12–46)
Lymphs Abs: 0.9 10*3/uL (ref 0.7–4.0)
Neutro Abs: 1.1 10*3/uL — ABNORMAL LOW (ref 1.7–7.7)
Neutrophils Relative %: 43 % (ref 43–77)

## 2012-05-06 LAB — CBC
Platelets: 108 10*3/uL — ABNORMAL LOW (ref 150–400)
RBC: 4.11 MIL/uL — ABNORMAL LOW (ref 4.22–5.81)
WBC: 2.6 10*3/uL — ABNORMAL LOW (ref 4.0–10.5)

## 2012-05-07 ENCOUNTER — Encounter (HOSPITAL_BASED_OUTPATIENT_CLINIC_OR_DEPARTMENT_OTHER): Payer: Medicare Other

## 2012-05-07 VITALS — BP 122/71 | HR 59 | Temp 97.1°F

## 2012-05-07 DIAGNOSIS — R05 Cough: Secondary | ICD-10-CM

## 2012-05-07 DIAGNOSIS — C8589 Other specified types of non-Hodgkin lymphoma, extranodal and solid organ sites: Secondary | ICD-10-CM

## 2012-05-07 DIAGNOSIS — R059 Cough, unspecified: Secondary | ICD-10-CM | POA: Diagnosis not present

## 2012-05-07 DIAGNOSIS — C9000 Multiple myeloma not having achieved remission: Secondary | ICD-10-CM | POA: Diagnosis not present

## 2012-05-07 LAB — KAPPA/LAMBDA LIGHT CHAINS
Kappa free light chain: 3.16 mg/dL — ABNORMAL HIGH (ref 0.33–1.94)
Lambda free light chains: 2.86 mg/dL — ABNORMAL HIGH (ref 0.57–2.63)

## 2012-05-07 MED ORDER — SULFAMETHOXAZOLE-TRIMETHOPRIM 800-160 MG PO TABS: 1.0000 | ORAL_TABLET | Freq: Two times a day (BID) | ORAL | Status: DC

## 2012-05-07 MED ORDER — HEPARIN SOD (PORK) LOCK FLUSH 100 UNIT/ML IV SOLN
INTRAVENOUS | Status: AC
  Filled 2012-05-07: qty 5

## 2012-05-07 MED ORDER — ZOLEDRONIC ACID 4 MG/5ML IV CONC
4.0000 mg | Freq: Once | INTRAVENOUS | Status: AC
  Administered 2012-05-07: 4 mg via INTRAVENOUS
  Filled 2012-05-07: qty 5

## 2012-05-07 MED ORDER — HEPARIN SOD (PORK) LOCK FLUSH 100 UNIT/ML IV SOLN
500.0000 [IU] | Freq: Once | INTRAVENOUS | Status: AC | PRN
  Administered 2012-05-07: 500 [IU]
  Filled 2012-05-07: qty 5

## 2012-05-07 MED ORDER — SODIUM CHLORIDE 0.9 % IV SOLN
Freq: Once | INTRAVENOUS | Status: AC
  Administered 2012-05-07: 09:00:00 via INTRAVENOUS

## 2012-05-07 NOTE — Progress Notes (Signed)
Jeremy Parrish is here for his infusion.  He reports that he feels like he will be getting ill in the next few days.  He reports that he has started with a cough.  At this time it is non-productive.  He denies any fevers.  He explains that this is usually his pattern before he becomes sick and requires IVIg.  His last IVIg was on 5/30 and 04/10/2012.  It has been only 4 weeks since his last infusion.    I think it is reasonable to try an antibiotic prophylactically.  He denies any allergies to antibiotics.  So I will try Septra DS x 5 days and see if we can prevent this potential early infection. Rx for Septra DS #10 tablets was provided today.    [name]

## 2012-05-08 LAB — MULTIPLE MYELOMA PANEL, SERUM
Albumin ELP: 51.7 % — ABNORMAL LOW (ref 55.8–66.1)
IgA: 310 mg/dL (ref 68–379)
IgM, Serum: 22 mg/dL — ABNORMAL LOW (ref 41–251)
M-Spike, %: NOT DETECTED g/dL
Total Protein: 7 g/dL (ref 6.0–8.3)

## 2012-05-15 ENCOUNTER — Encounter (HOSPITAL_COMMUNITY): Payer: Self-pay | Admitting: Oncology

## 2012-05-15 ENCOUNTER — Encounter (HOSPITAL_COMMUNITY): Payer: Medicare Other | Attending: Oncology | Admitting: Oncology

## 2012-05-15 VITALS — BP 111/64 | HR 64 | Temp 97.7°F | Wt 191.8 lb

## 2012-05-15 DIAGNOSIS — C9000 Multiple myeloma not having achieved remission: Secondary | ICD-10-CM | POA: Diagnosis not present

## 2012-05-15 DIAGNOSIS — C8589 Other specified types of non-Hodgkin lymphoma, extranodal and solid organ sites: Secondary | ICD-10-CM

## 2012-05-15 DIAGNOSIS — D801 Nonfamilial hypogammaglobulinemia: Secondary | ICD-10-CM | POA: Diagnosis not present

## 2012-05-15 DIAGNOSIS — M810 Age-related osteoporosis without current pathological fracture: Secondary | ICD-10-CM

## 2012-05-15 NOTE — Patient Instructions (Addendum)
Jeremy Parrish  [id number] September 05, 1941 Dr. Glenford Peers   [facility]  Discharge Instructions  RECOMMENDATIONS MADE BY THE CONSULTANT AND ANY TEST RESULTS WILL BE SENT TO YOUR REFERRING DOCTOR.   EXAM FINDINGS BY MD TODAY AND SIGNS AND SYMPTOMS TO REPORT TO CLINIC OR PRIMARY MD: Exam findings are good as discussed by Dr. Mariel Sleet.  Please continue to return for Zometa infusions as scheduled; you will have labs done every 8 weeks (instead of every 4 weeks).    I acknowledge that I have been informed and understand all the instructions given to me and received a copy. I do not have any more questions at this time, but understand that I may call the Specialty Clinic at [facility] at [phone] during business hours should I have any further questions or need assistance in obtaining follow-up care.    __________________________________________  _____________  __________ Signature of Patient or Authorized Representative            Date                   Time    __________________________________________ Nurse's Signature

## 2012-05-15 NOTE — Progress Notes (Signed)
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 CHOP x6 with an excellent response and PET scan in June 2012 was negative.  Problem #4 peripheral neuropathy grade 1 much improved almost gone.  Problem #5 CAD status post stent placement x5 in the past  Problem #6 osteoporosis on therapy with Zometa calcium and vitamin D  Problem #7 hypogammaglobulinemia requiring IVIG intermittently.  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 Cruzita Lederer finished an antibiotic about 10 days ago. He is not coughing whatsoever he sleeping well during the night no fevers no chills. His lungs today are perfectly clear to auscultation and percussion. His vital signs are stable without any increase in temperature. Pulse 80 and regular his heart shows no S3 gallop or murmur his abdomen is soft and nontender without organomegaly and there is no peripheral lymphadenopathy in any location. He has no leg edema no arm edema.  His labs continue to show him in remission from the myeloma. His hemoglobin recently was 12 g white count was slightly better platelets were 100,000 also slightly better. We will change his labs except for the pre-Zometa labs to every other month. We will give him an appointment for 6 months sooner if need be.

## 2012-05-18 ENCOUNTER — Encounter: Payer: Self-pay | Admitting: Oncology

## 2012-05-19 ENCOUNTER — Encounter (HOSPITAL_COMMUNITY): Payer: Self-pay | Admitting: Oncology

## 2012-06-02 ENCOUNTER — Encounter (HOSPITAL_BASED_OUTPATIENT_CLINIC_OR_DEPARTMENT_OTHER): Payer: Medicare Other

## 2012-06-02 ENCOUNTER — Encounter (HOSPITAL_COMMUNITY): Payer: Self-pay

## 2012-06-02 ENCOUNTER — Telehealth (HOSPITAL_COMMUNITY): Payer: Self-pay

## 2012-06-02 DIAGNOSIS — C8589 Other specified types of non-Hodgkin lymphoma, extranodal and solid organ sites: Secondary | ICD-10-CM | POA: Diagnosis not present

## 2012-06-02 DIAGNOSIS — C9000 Multiple myeloma not having achieved remission: Secondary | ICD-10-CM

## 2012-06-02 LAB — COMPREHENSIVE METABOLIC PANEL
ALT: 18 U/L (ref 0–53)
AST: 24 U/L (ref 0–37)
CO2: 27 mEq/L (ref 19–32)
Calcium: 9.2 mg/dL (ref 8.4–10.5)
Chloride: 105 mEq/L (ref 96–112)
GFR calc non Af Amer: 82 mL/min — ABNORMAL LOW (ref >90)
Potassium: 4 mEq/L (ref 3.5–5.1)
Sodium: 139 mEq/L (ref 135–145)

## 2012-06-02 LAB — CBC
HCT: 36.3 % — ABNORMAL LOW (ref 39.0–52.0)
MCH: 30 pg (ref 26.0–34.0)
MCV: 89.9 fL (ref 78.0–100.0)
RDW: 15.9 % — ABNORMAL HIGH (ref 11.5–15.5)
WBC: 3.3 10*3/uL — ABNORMAL LOW (ref 4.0–10.5)

## 2012-06-02 LAB — DIFFERENTIAL
Eosinophils Relative: 7 % — ABNORMAL HIGH (ref 0–5)
Lymphocytes Relative: 27 % (ref 12–46)
Monocytes Absolute: 0.5 10*3/uL (ref 0.1–1.0)

## 2012-06-02 NOTE — Progress Notes (Signed)
Labs drawn today for cbc/diff,cmp,kllc,mm 

## 2012-06-02 NOTE — Progress Notes (Signed)
Patient in for blood work.  Requests refill for Sulfamethoxazole-TMP DS.  States " last month [facility] PA, gave me a prescription for this antibiotic because I was getting the cough again and he thought it would keep it from getting too bad, which it did.  I'm beginning to cough again and what's coming up is clear, but I'm feeling like I did before and think if I take the antibiotic again it will keep me from getting sick."

## 2012-06-02 NOTE — Telephone Encounter (Signed)
Patient notified that antibiotics would not be prescribed unless he had fever, coughing productively - yellow or greenish mucus.  Verbalized understanding.

## 2012-06-03 LAB — KAPPA/LAMBDA LIGHT CHAINS
Kappa free light chain: 2.74 mg/dL — ABNORMAL HIGH (ref 0.33–1.94)
Kappa, lambda light chain ratio: 1.22 (ref 0.26–1.65)
Lambda free light chains: 2.24 mg/dL (ref 0.57–2.63)

## 2012-06-04 ENCOUNTER — Encounter (HOSPITAL_BASED_OUTPATIENT_CLINIC_OR_DEPARTMENT_OTHER): Payer: Medicare Other

## 2012-06-04 DIAGNOSIS — C9000 Multiple myeloma not having achieved remission: Secondary | ICD-10-CM

## 2012-06-04 DIAGNOSIS — C8589 Other specified types of non-Hodgkin lymphoma, extranodal and solid organ sites: Secondary | ICD-10-CM

## 2012-06-04 LAB — MULTIPLE MYELOMA PANEL, SERUM
Alpha-1-Globulin: 4.8 % (ref 2.9–4.9)
Beta 2: 5.1 % (ref 3.2–6.5)
Gamma Globulin: 16.8 % (ref 11.1–18.8)
IgG (Immunoglobin G), Serum: 1110 mg/dL (ref 650–1600)
M-Spike, %: NOT DETECTED g/dL

## 2012-06-04 MED ORDER — HEPARIN SOD (PORK) LOCK FLUSH 100 UNIT/ML IV SOLN
INTRAVENOUS | Status: AC
  Filled 2012-06-04: qty 5

## 2012-06-04 MED ORDER — HEPARIN SOD (PORK) LOCK FLUSH 100 UNIT/ML IV SOLN
500.0000 [IU] | Freq: Once | INTRAVENOUS | Status: AC | PRN
  Administered 2012-06-04: 500 [IU]
  Filled 2012-06-04: qty 5

## 2012-06-04 MED ORDER — ZOLEDRONIC ACID 4 MG/5ML IV CONC
4.0000 mg | Freq: Once | INTRAVENOUS | Status: AC
  Administered 2012-06-04: 4 mg via INTRAVENOUS
  Filled 2012-06-04: qty 5

## 2012-06-04 MED ORDER — SODIUM CHLORIDE 0.9 % IV SOLN
Freq: Once | INTRAVENOUS | Status: AC
  Administered 2012-06-04: 10:00:00 via INTRAVENOUS

## 2012-06-04 MED ORDER — HYDROCODONE-ACETAMINOPHEN 5-325 MG PO TABS: 1.0000 | ORAL_TABLET | Freq: Three times a day (TID) | ORAL | Status: DC | PRN

## 2012-06-17 DIAGNOSIS — Z45018 Encounter for adjustment and management of other part of cardiac pacemaker: Secondary | ICD-10-CM | POA: Diagnosis not present

## 2012-06-17 DIAGNOSIS — I495 Sick sinus syndrome: Secondary | ICD-10-CM | POA: Diagnosis not present

## 2012-06-24 ENCOUNTER — Encounter: Payer: Self-pay | Admitting: Oncology

## 2012-06-24 DIAGNOSIS — J069 Acute upper respiratory infection, unspecified: Secondary | ICD-10-CM | POA: Diagnosis not present

## 2012-06-29 DIAGNOSIS — M25519 Pain in unspecified shoulder: Secondary | ICD-10-CM | POA: Diagnosis not present

## 2012-07-01 ENCOUNTER — Encounter (HOSPITAL_COMMUNITY): Payer: Medicare Other | Attending: Oncology

## 2012-07-01 DIAGNOSIS — C8589 Other specified types of non-Hodgkin lymphoma, extranodal and solid organ sites: Secondary | ICD-10-CM | POA: Diagnosis not present

## 2012-07-01 DIAGNOSIS — C9000 Multiple myeloma not having achieved remission: Secondary | ICD-10-CM

## 2012-07-01 LAB — COMPREHENSIVE METABOLIC PANEL
ALT: 17 U/L (ref 0–53)
AST: 20 U/L (ref 0–37)
CO2: 27 mEq/L (ref 19–32)
Calcium: 9.4 mg/dL (ref 8.4–10.5)
Creatinine, Ser: 0.88 mg/dL (ref 0.50–1.35)
GFR calc Af Amer: >90 mL/min (ref >90)
GFR calc non Af Amer: 86 mL/min — ABNORMAL LOW (ref >90)
Glucose, Bld: 103 mg/dL — ABNORMAL HIGH (ref 70–99)
Sodium: 139 mEq/L (ref 135–145)
Total Protein: 6.8 g/dL (ref 6.0–8.3)

## 2012-07-01 LAB — CBC
MCH: 30.5 pg (ref 26.0–34.0)
MCHC: 33.7 g/dL (ref 30.0–36.0)
MCV: 90.6 fL (ref 78.0–100.0)
Platelets: 117 10*3/uL — ABNORMAL LOW (ref 150–400)
RBC: 3.93 MIL/uL — ABNORMAL LOW (ref 4.22–5.81)

## 2012-07-01 LAB — DIFFERENTIAL
Basophils Absolute: 0 10*3/uL (ref 0.0–0.1)
Lymphocytes Relative: 14 % (ref 12–46)
Lymphs Abs: 0.6 10*3/uL — ABNORMAL LOW (ref 0.7–4.0)
Neutro Abs: 3.5 10*3/uL (ref 1.7–7.7)

## 2012-07-01 NOTE — Progress Notes (Signed)
Labs drawn today for cbc/diff,cmp,kllc,mm 

## 2012-07-02 ENCOUNTER — Encounter (HOSPITAL_BASED_OUTPATIENT_CLINIC_OR_DEPARTMENT_OTHER): Payer: Medicare Other

## 2012-07-02 VITALS — BP 128/81 | HR 62 | Temp 97.1°F | Resp 20

## 2012-07-02 DIAGNOSIS — C9000 Multiple myeloma not having achieved remission: Secondary | ICD-10-CM

## 2012-07-02 DIAGNOSIS — C8589 Other specified types of non-Hodgkin lymphoma, extranodal and solid organ sites: Secondary | ICD-10-CM

## 2012-07-02 LAB — KAPPA/LAMBDA LIGHT CHAINS
Kappa free light chain: 0.03 mg/dL — ABNORMAL LOW (ref 0.33–1.94)
Kappa, lambda light chain ratio: 0.04 — ABNORMAL LOW (ref 0.26–1.65)
Lambda free light chains: 0.84 mg/dL (ref 0.57–2.63)

## 2012-07-02 MED ORDER — SODIUM CHLORIDE 0.9 % IJ SOLN
10.0000 mL | INTRAMUSCULAR | Status: DC | PRN
  Administered 2012-07-02: 10 mL
  Filled 2012-07-02: qty 10

## 2012-07-02 MED ORDER — SODIUM CHLORIDE 0.9 % IV SOLN
Freq: Once | INTRAVENOUS | Status: AC
  Administered 2012-07-02: 10:00:00 via INTRAVENOUS

## 2012-07-02 MED ORDER — ZOLEDRONIC ACID 4 MG/5ML IV CONC
4.0000 mg | Freq: Once | INTRAVENOUS | Status: AC
  Administered 2012-07-02: 4 mg via INTRAVENOUS
  Filled 2012-07-02: qty 5

## 2012-07-02 MED ORDER — HEPARIN SOD (PORK) LOCK FLUSH 100 UNIT/ML IV SOLN
500.0000 [IU] | Freq: Once | INTRAVENOUS | Status: AC | PRN
  Administered 2012-07-02: 500 [IU]
  Filled 2012-07-02: qty 5

## 2012-07-02 MED ORDER — HEPARIN SOD (PORK) LOCK FLUSH 100 UNIT/ML IV SOLN
INTRAVENOUS | Status: AC
  Filled 2012-07-02: qty 5

## 2012-07-02 NOTE — Progress Notes (Signed)
[name] tolerated infusion well and without incident; verbalizes understanding for follow-up.  No distress noted at time of discharge and patient was discharged home by himself.

## 2012-07-03 LAB — MULTIPLE MYELOMA PANEL, SERUM
Albumin ELP: 60.6 % (ref 55.8–66.1)
IgA: 228 mg/dL (ref 68–379)
IgG (Immunoglobin G), Serum: 908 mg/dL (ref 650–1600)
IgM, Serum: 15 mg/dL — ABNORMAL LOW (ref 41–251)
M-Spike, %: NOT DETECTED g/dL
Total Protein: 6.4 g/dL (ref 6.0–8.3)

## 2012-07-14 DIAGNOSIS — J479 Bronchiectasis, uncomplicated: Secondary | ICD-10-CM | POA: Diagnosis not present

## 2012-07-16 ENCOUNTER — Other Ambulatory Visit (HOSPITAL_COMMUNITY): Payer: Self-pay | Admitting: Oncology

## 2012-07-16 DIAGNOSIS — C9000 Multiple myeloma not having achieved remission: Secondary | ICD-10-CM

## 2012-07-16 MED ORDER — LENALIDOMIDE 10 MG PO CAPS: ORAL_CAPSULE | ORAL | Status: DC

## 2012-07-17 ENCOUNTER — Other Ambulatory Visit (HOSPITAL_COMMUNITY): Payer: Self-pay | Admitting: Oncology

## 2012-07-17 ENCOUNTER — Telehealth (HOSPITAL_COMMUNITY): Payer: Self-pay

## 2012-07-17 ENCOUNTER — Other Ambulatory Visit (HOSPITAL_COMMUNITY): Payer: Self-pay

## 2012-07-17 DIAGNOSIS — C9000 Multiple myeloma not having achieved remission: Secondary | ICD-10-CM

## 2012-07-17 MED ORDER — LENALIDOMIDE 10 MG PO CAPS: ORAL_CAPSULE | ORAL | Status: DC

## 2012-07-17 NOTE — Telephone Encounter (Signed)
Call from [facility] for prescription clarification.  Per patient he is taking revlimid 10 mg bid 7 days on and 7 days off.  New prescription will be sent by Dellis Anes, PA.

## 2012-07-21 DIAGNOSIS — M19019 Primary osteoarthritis, unspecified shoulder: Secondary | ICD-10-CM | POA: Diagnosis not present

## 2012-07-23 ENCOUNTER — Encounter: Payer: Self-pay | Admitting: Oncology

## 2012-07-25 ENCOUNTER — Other Ambulatory Visit (HOSPITAL_COMMUNITY): Payer: Self-pay | Admitting: Oncology

## 2012-07-29 ENCOUNTER — Encounter (HOSPITAL_COMMUNITY): Payer: Medicare Other | Attending: Oncology

## 2012-07-29 DIAGNOSIS — C9000 Multiple myeloma not having achieved remission: Secondary | ICD-10-CM | POA: Diagnosis not present

## 2012-07-29 DIAGNOSIS — C8589 Other specified types of non-Hodgkin lymphoma, extranodal and solid organ sites: Secondary | ICD-10-CM | POA: Diagnosis not present

## 2012-07-29 LAB — CBC
HCT: 37.3 % — ABNORMAL LOW (ref 39.0–52.0)
MCH: 31.7 pg (ref 26.0–34.0)
MCHC: 34.6 g/dL (ref 30.0–36.0)
MCV: 91.6 fL (ref 78.0–100.0)
RDW: 16.3 % — ABNORMAL HIGH (ref 11.5–15.5)

## 2012-07-29 LAB — DIFFERENTIAL
Basophils Relative: 1 % (ref 0–1)
Eosinophils Absolute: 0.2 10*3/uL (ref 0.0–0.7)
Lymphs Abs: 0.7 10*3/uL (ref 0.7–4.0)
Monocytes Relative: 10 % (ref 3–12)
Neutro Abs: 1.5 10*3/uL — ABNORMAL LOW (ref 1.7–7.7)
Neutrophils Relative %: 55 % (ref 43–77)

## 2012-07-29 LAB — COMPREHENSIVE METABOLIC PANEL
Albumin: 3.6 g/dL (ref 3.5–5.2)
BUN: 11 mg/dL (ref 6–23)
Creatinine, Ser: 0.86 mg/dL (ref 0.50–1.35)
Total Bilirubin: 0.6 mg/dL (ref 0.3–1.2)
Total Protein: 6.4 g/dL (ref 6.0–8.3)

## 2012-07-29 NOTE — Progress Notes (Signed)
Labs drawn today for cbc/diff,cmp,mm,kllc,mg

## 2012-07-30 ENCOUNTER — Encounter (HOSPITAL_BASED_OUTPATIENT_CLINIC_OR_DEPARTMENT_OTHER): Payer: Medicare Other

## 2012-07-30 VITALS — BP 110/78 | HR 74 | Wt 191.0 lb

## 2012-07-30 DIAGNOSIS — C9000 Multiple myeloma not having achieved remission: Secondary | ICD-10-CM

## 2012-07-30 DIAGNOSIS — C8589 Other specified types of non-Hodgkin lymphoma, extranodal and solid organ sites: Secondary | ICD-10-CM

## 2012-07-30 LAB — KAPPA/LAMBDA LIGHT CHAINS
Kappa free light chain: 0.49 mg/dL (ref 0.33–1.94)
Kappa, lambda light chain ratio: 0.26 (ref 0.26–1.65)
Lambda free light chains: 1.88 mg/dL (ref 0.57–2.63)

## 2012-07-30 MED ORDER — SODIUM CHLORIDE 0.9 % IJ SOLN
10.0000 mL | INTRAMUSCULAR | Status: DC | PRN
Start: 2012-07-30 — End: 2012-07-30
  Filled 2012-07-30: qty 10

## 2012-07-30 MED ORDER — HEPARIN SOD (PORK) LOCK FLUSH 100 UNIT/ML IV SOLN
500.0000 [IU] | Freq: Once | INTRAVENOUS | Status: AC | PRN
  Administered 2012-07-30: 500 [IU]
  Filled 2012-07-30: qty 5

## 2012-07-30 MED ORDER — ZOLEDRONIC ACID 4 MG/5ML IV CONC
4.0000 mg | Freq: Once | INTRAVENOUS | Status: AC
  Administered 2012-07-30: 4 mg via INTRAVENOUS
  Filled 2012-07-30: qty 5

## 2012-07-30 MED ORDER — SODIUM CHLORIDE 0.9 % IV SOLN
Freq: Once | INTRAVENOUS | Status: AC
  Administered 2012-07-30: 10:00:00 via INTRAVENOUS

## 2012-07-30 MED ORDER — HEPARIN SOD (PORK) LOCK FLUSH 100 UNIT/ML IV SOLN
INTRAVENOUS | Status: AC
  Filled 2012-07-30: qty 5

## 2012-07-31 LAB — MULTIPLE MYELOMA PANEL, SERUM
Albumin ELP: 60.3 % (ref 55.8–66.1)
Beta 2: 5.3 % (ref 3.2–6.5)
Beta Globulin: 6.6 % (ref 4.7–7.2)
IgM, Serum: 17 mg/dL — ABNORMAL LOW (ref 41–251)
M-Spike, %: NOT DETECTED g/dL
Total Protein: 6.1 g/dL (ref 6.0–8.3)

## 2012-08-14 DIAGNOSIS — M1710 Unilateral primary osteoarthritis, unspecified knee: Secondary | ICD-10-CM | POA: Diagnosis not present

## 2012-08-21 ENCOUNTER — Other Ambulatory Visit (HOSPITAL_COMMUNITY): Payer: Self-pay | Admitting: Oncology

## 2012-08-21 DIAGNOSIS — C9000 Multiple myeloma not having achieved remission: Secondary | ICD-10-CM

## 2012-08-21 MED ORDER — LENALIDOMIDE 10 MG PO CAPS: ORAL_CAPSULE | ORAL | Status: DC

## 2012-08-26 ENCOUNTER — Telehealth (HOSPITAL_COMMUNITY): Payer: Self-pay

## 2012-08-26 ENCOUNTER — Encounter (HOSPITAL_COMMUNITY): Payer: Medicare Other | Attending: Oncology

## 2012-08-26 DIAGNOSIS — C9000 Multiple myeloma not having achieved remission: Secondary | ICD-10-CM | POA: Diagnosis not present

## 2012-08-26 DIAGNOSIS — C8589 Other specified types of non-Hodgkin lymphoma, extranodal and solid organ sites: Secondary | ICD-10-CM | POA: Diagnosis not present

## 2012-08-26 DIAGNOSIS — E876 Hypokalemia: Secondary | ICD-10-CM

## 2012-08-26 LAB — COMPREHENSIVE METABOLIC PANEL
ALT: 21 U/L (ref 0–53)
Albumin: 3.5 g/dL (ref 3.5–5.2)
Alkaline Phosphatase: 76 U/L (ref 39–117)
Chloride: 104 mEq/L (ref 96–112)
Glucose, Bld: 108 mg/dL — ABNORMAL HIGH (ref 70–99)
Potassium: 3.4 mEq/L — ABNORMAL LOW (ref 3.5–5.1)
Sodium: 139 mEq/L (ref 135–145)
Total Bilirubin: 0.7 mg/dL (ref 0.3–1.2)
Total Protein: 6.7 g/dL (ref 6.0–8.3)

## 2012-08-26 LAB — DIFFERENTIAL
Basophils Relative: 1 % (ref 0–1)
Eosinophils Absolute: 0.1 10*3/uL (ref 0.0–0.7)
Eosinophils Relative: 3 % (ref 0–5)
Lymphs Abs: 0.8 10*3/uL (ref 0.7–4.0)
Monocytes Relative: 8 % (ref 3–12)

## 2012-08-26 LAB — CBC
Hemoglobin: 13.1 g/dL (ref 13.0–17.0)
MCHC: 32.6 g/dL (ref 30.0–36.0)
RDW: 16.9 % — ABNORMAL HIGH (ref 11.5–15.5)
WBC: 3.9 10*3/uL — ABNORMAL LOW (ref 4.0–10.5)

## 2012-08-26 NOTE — Telephone Encounter (Signed)
Patient is taking Potassium 20 mEq  Twice daily.  Has been having muscle cramps more often recently.

## 2012-08-26 NOTE — Telephone Encounter (Signed)
Message copied by Evelena Leyden on Wed Aug 26, 2012  5:08 PM ------      Message from: Mariel Sleet, ERIC S      Created: Wed Aug 26, 2012 12:31 PM       Increase it to BID for 14 days and repeat K+

## 2012-08-26 NOTE — Progress Notes (Signed)
Labs drawn today for cbc/diff,cmp,kllc,mm panel 

## 2012-08-26 NOTE — Telephone Encounter (Signed)
Patient already taking potassium bid - discussed with MD and patient to take it TID for 14 days then return for repeat K+ level.  Verbalized understanding.

## 2012-08-26 NOTE — Telephone Encounter (Signed)
Message copied by Evelena Leyden on Wed Aug 26, 2012  5:19 PM ------      Message from: Mariel Sleet, ERIC S      Created: Wed Aug 26, 2012 12:31 PM       Increase it to BID for 14 days and repeat K+

## 2012-08-27 ENCOUNTER — Encounter (HOSPITAL_BASED_OUTPATIENT_CLINIC_OR_DEPARTMENT_OTHER): Payer: Medicare Other

## 2012-08-27 VITALS — BP 135/78 | HR 67 | Temp 98.2°F | Resp 18 | Wt 192.8 lb

## 2012-08-27 DIAGNOSIS — C9000 Multiple myeloma not having achieved remission: Secondary | ICD-10-CM | POA: Diagnosis not present

## 2012-08-27 DIAGNOSIS — Z23 Encounter for immunization: Secondary | ICD-10-CM | POA: Diagnosis not present

## 2012-08-27 DIAGNOSIS — C8589 Other specified types of non-Hodgkin lymphoma, extranodal and solid organ sites: Secondary | ICD-10-CM

## 2012-08-27 MED ORDER — HEPARIN SOD (PORK) LOCK FLUSH 100 UNIT/ML IV SOLN
INTRAVENOUS | Status: AC
  Filled 2012-08-27: qty 5

## 2012-08-27 MED ORDER — INFLUENZA VIRUS VACC SPLIT PF IM SUSP
0.5000 mL | INTRAMUSCULAR | Status: AC
  Administered 2012-08-27: 0.5 mL via INTRAMUSCULAR

## 2012-08-27 MED ORDER — SODIUM CHLORIDE 0.9 % IJ SOLN
INTRAMUSCULAR | Status: AC
  Filled 2012-08-27: qty 10

## 2012-08-27 MED ORDER — SODIUM CHLORIDE 0.9 % IJ SOLN
10.0000 mL | INTRAMUSCULAR | Status: DC | PRN
  Filled 2012-08-27: qty 10

## 2012-08-27 MED ORDER — HEPARIN SOD (PORK) LOCK FLUSH 100 UNIT/ML IV SOLN
500.0000 [IU] | Freq: Once | INTRAVENOUS | Status: AC | PRN
  Administered 2012-08-27: 500 [IU]
  Filled 2012-08-27: qty 5

## 2012-08-27 MED ORDER — INFLUENZA VIRUS VACC SPLIT PF IM SUSP
INTRAMUSCULAR | Status: AC
  Filled 2012-08-27: qty 0.5

## 2012-08-27 MED ORDER — SODIUM CHLORIDE 0.9 % IV SOLN
Freq: Once | INTRAVENOUS | Status: AC
  Administered 2012-08-27: 10:00:00 via INTRAVENOUS

## 2012-08-27 MED ORDER — ZOLEDRONIC ACID 4 MG/5ML IV CONC
4.0000 mg | Freq: Once | INTRAVENOUS | Status: AC
  Administered 2012-08-27: 4 mg via INTRAVENOUS
  Filled 2012-08-27: qty 5

## 2012-08-27 NOTE — Progress Notes (Signed)
[name] presents today for injection per MD orders. Flu Vaccine administered IM in left Upper Arm. Administration without incident. Patient tolerated well.

## 2012-08-28 LAB — MULTIPLE MYELOMA PANEL, SERUM
Albumin ELP: 58.1 % (ref 55.8–66.1)
Alpha-1-Globulin: 7.2 % — ABNORMAL HIGH (ref 2.9–4.9)
Alpha-2-Globulin: 13.6 % — ABNORMAL HIGH (ref 7.1–11.8)
Beta 2: 4.7 % (ref 3.2–6.5)
Beta Globulin: 6.9 % (ref 4.7–7.2)
IgM, Serum: 18 mg/dL — ABNORMAL LOW (ref 41–251)

## 2012-09-07 DIAGNOSIS — J479 Bronchiectasis, uncomplicated: Secondary | ICD-10-CM | POA: Diagnosis not present

## 2012-09-07 DIAGNOSIS — C9000 Multiple myeloma not having achieved remission: Secondary | ICD-10-CM | POA: Diagnosis not present

## 2012-09-07 DIAGNOSIS — Z9481 Bone marrow transplant status: Secondary | ICD-10-CM | POA: Diagnosis not present

## 2012-09-07 DIAGNOSIS — Z950 Presence of cardiac pacemaker: Secondary | ICD-10-CM | POA: Diagnosis not present

## 2012-09-07 DIAGNOSIS — I498 Other specified cardiac arrhythmias: Secondary | ICD-10-CM | POA: Diagnosis not present

## 2012-09-09 ENCOUNTER — Encounter (HOSPITAL_BASED_OUTPATIENT_CLINIC_OR_DEPARTMENT_OTHER): Payer: Medicare Other

## 2012-09-09 DIAGNOSIS — E876 Hypokalemia: Secondary | ICD-10-CM

## 2012-09-09 DIAGNOSIS — C8589 Other specified types of non-Hodgkin lymphoma, extranodal and solid organ sites: Secondary | ICD-10-CM | POA: Diagnosis not present

## 2012-09-09 DIAGNOSIS — C9000 Multiple myeloma not having achieved remission: Secondary | ICD-10-CM | POA: Diagnosis not present

## 2012-09-09 LAB — POTASSIUM: Potassium: 3.9 mEq/L (ref 3.5–5.1)

## 2012-09-09 NOTE — Progress Notes (Signed)
Labs drawn today for k+ 

## 2012-09-16 ENCOUNTER — Telehealth (HOSPITAL_COMMUNITY): Payer: Self-pay

## 2012-09-16 NOTE — Telephone Encounter (Signed)
Patient notified that Dr. Mariel Sleet agreed with Dr. Greggory Stallion regarding being off zometa for 2 months prior and 1 month after dental surger.

## 2012-09-22 ENCOUNTER — Encounter: Payer: Self-pay | Admitting: Oncology

## 2012-09-24 ENCOUNTER — Other Ambulatory Visit (HOSPITAL_COMMUNITY): Payer: Self-pay | Admitting: Oncology

## 2012-09-24 DIAGNOSIS — J329 Chronic sinusitis, unspecified: Secondary | ICD-10-CM

## 2012-09-24 DIAGNOSIS — C9000 Multiple myeloma not having achieved remission: Secondary | ICD-10-CM

## 2012-09-24 DIAGNOSIS — C8589 Other specified types of non-Hodgkin lymphoma, extranodal and solid organ sites: Secondary | ICD-10-CM

## 2012-09-24 MED ORDER — LEVOFLOXACIN 500 MG PO TABS: 500.0000 mg | ORAL_TABLET | Freq: Every day | ORAL | Status: AC

## 2012-09-25 ENCOUNTER — Other Ambulatory Visit (HOSPITAL_COMMUNITY): Payer: Self-pay | Admitting: Oncology

## 2012-09-25 DIAGNOSIS — C9000 Multiple myeloma not having achieved remission: Secondary | ICD-10-CM

## 2012-09-25 MED ORDER — LENALIDOMIDE 10 MG PO CAPS: ORAL_CAPSULE | ORAL | Status: DC

## 2012-09-28 ENCOUNTER — Encounter (HOSPITAL_COMMUNITY): Payer: Self-pay | Admitting: Oncology

## 2012-09-28 ENCOUNTER — Other Ambulatory Visit (HOSPITAL_COMMUNITY): Payer: Self-pay | Admitting: Oncology

## 2012-09-28 DIAGNOSIS — D801 Nonfamilial hypogammaglobulinemia: Secondary | ICD-10-CM

## 2012-09-28 HISTORY — DX: Nonfamilial hypogammaglobulinemia: D80.1

## 2012-09-29 ENCOUNTER — Encounter (HOSPITAL_COMMUNITY): Payer: Medicare Other | Attending: Oncology

## 2012-09-29 VITALS — BP 134/73 | HR 71 | Temp 97.9°F | Resp 98

## 2012-09-29 DIAGNOSIS — D801 Nonfamilial hypogammaglobulinemia: Secondary | ICD-10-CM | POA: Diagnosis not present

## 2012-09-29 MED ORDER — DEXTROSE 5 % IV SOLN
INTRAVENOUS | Status: DC
  Administered 2012-09-29: 09:00:00 via INTRAVENOUS

## 2012-09-29 MED ORDER — IMMUNE GLOBULIN (HUMAN) 10 GM/200ML IV SOLN
80.0000 g | Freq: Once | INTRAVENOUS | Status: AC
  Administered 2012-09-29: 80 g via INTRAVENOUS
  Filled 2012-09-29: qty 1600

## 2012-09-29 MED ORDER — HEPARIN SOD (PORK) LOCK FLUSH 100 UNIT/ML IV SOLN
INTRAVENOUS | Status: AC
  Filled 2012-09-29: qty 5

## 2012-09-29 MED ORDER — IMMUNE GLOBULIN (HUMAN) 10 GM/200ML IV SOLN: 1.0000 g/kg | Freq: Once | INTRAVENOUS | Status: DC

## 2012-09-29 MED ORDER — IMMUNE GLOBULIN (HUMAN) 10 GM/200ML IV SOLN
10.0000 g | Freq: Once | INTRAVENOUS | Status: AC
  Administered 2012-09-29: 10 g via INTRAVENOUS
  Filled 2012-09-29: qty 200

## 2012-09-29 NOTE — Progress Notes (Signed)
Tolerated well

## 2012-09-30 ENCOUNTER — Encounter (HOSPITAL_BASED_OUTPATIENT_CLINIC_OR_DEPARTMENT_OTHER): Payer: Medicare Other

## 2012-09-30 VITALS — BP 112/63 | HR 65 | Temp 100.0°F | Resp 20

## 2012-09-30 DIAGNOSIS — D801 Nonfamilial hypogammaglobulinemia: Secondary | ICD-10-CM

## 2012-09-30 MED ORDER — HEPARIN SOD (PORK) LOCK FLUSH 100 UNIT/ML IV SOLN
INTRAVENOUS | Status: AC
  Filled 2012-09-30: qty 5

## 2012-09-30 MED ORDER — IMMUNE GLOBULIN (HUMAN) 10 GM/200ML IV SOLN: 1.0000 g/kg | Freq: Once | INTRAVENOUS | Status: DC

## 2012-09-30 MED ORDER — IMMUNE GLOBULIN (HUMAN) 10 GM/200ML IV SOLN
10.0000 g | Freq: Once | INTRAVENOUS | Status: AC
  Administered 2012-09-30: 10 g via INTRAVENOUS
  Filled 2012-09-30: qty 200

## 2012-09-30 MED ORDER — IMMUNE GLOBULIN (HUMAN) 10 GM/200ML IV SOLN
80.0000 g | Freq: Once | INTRAVENOUS | Status: AC
  Administered 2012-09-30: 80 g via INTRAVENOUS
  Filled 2012-09-30: qty 1600

## 2012-09-30 MED ORDER — SODIUM CHLORIDE 0.9 % IJ SOLN
INTRAMUSCULAR | Status: AC
  Filled 2012-09-30: qty 10

## 2012-09-30 NOTE — Progress Notes (Signed)
Tolerated IVIG well.  Increased temp of 100 at d/c.  Advised to take Tylenol or Advil for fever.

## 2012-10-02 DIAGNOSIS — J45909 Unspecified asthma, uncomplicated: Secondary | ICD-10-CM | POA: Diagnosis not present

## 2012-10-02 DIAGNOSIS — J189 Pneumonia, unspecified organism: Secondary | ICD-10-CM | POA: Diagnosis not present

## 2012-10-02 DIAGNOSIS — J449 Chronic obstructive pulmonary disease, unspecified: Secondary | ICD-10-CM | POA: Diagnosis not present

## 2012-10-06 ENCOUNTER — Emergency Department (HOSPITAL_COMMUNITY): Payer: Medicare Other

## 2012-10-06 ENCOUNTER — Other Ambulatory Visit: Payer: Self-pay

## 2012-10-06 ENCOUNTER — Inpatient Hospital Stay (HOSPITAL_COMMUNITY): Payer: Medicare Other

## 2012-10-06 ENCOUNTER — Inpatient Hospital Stay (HOSPITAL_COMMUNITY)
Admission: EM | Admit: 2012-10-06 | Discharge: 2012-10-08 | DRG: 062 | Disposition: A | Discharge status: Home / Self Care | Payer: Medicare Other | Attending: Neurology | Admitting: Neurology

## 2012-10-06 ENCOUNTER — Encounter (HOSPITAL_COMMUNITY): Payer: Self-pay | Admitting: *Deleted

## 2012-10-06 DIAGNOSIS — C9001 Multiple myeloma in remission: Secondary | ICD-10-CM | POA: Diagnosis present

## 2012-10-06 DIAGNOSIS — G4700 Insomnia, unspecified: Secondary | ICD-10-CM

## 2012-10-06 DIAGNOSIS — I10 Essential (primary) hypertension: Secondary | ICD-10-CM | POA: Diagnosis present

## 2012-10-06 DIAGNOSIS — I639 Cerebral infarction, unspecified: Secondary | ICD-10-CM

## 2012-10-06 DIAGNOSIS — D649 Anemia, unspecified: Secondary | ICD-10-CM | POA: Diagnosis present

## 2012-10-06 DIAGNOSIS — I6350 Cerebral infarction due to unspecified occlusion or stenosis of unspecified cerebral artery: Secondary | ICD-10-CM | POA: Diagnosis not present

## 2012-10-06 DIAGNOSIS — Z87891 Personal history of nicotine dependence: Secondary | ICD-10-CM | POA: Diagnosis not present

## 2012-10-06 DIAGNOSIS — C9000 Multiple myeloma not having achieved remission: Secondary | ICD-10-CM

## 2012-10-06 DIAGNOSIS — Z8551 Personal history of malignant neoplasm of bladder: Secondary | ICD-10-CM

## 2012-10-06 DIAGNOSIS — E876 Hypokalemia: Secondary | ICD-10-CM | POA: Diagnosis present

## 2012-10-06 DIAGNOSIS — Z950 Presence of cardiac pacemaker: Secondary | ICD-10-CM | POA: Diagnosis not present

## 2012-10-06 DIAGNOSIS — R29810 Facial weakness: Secondary | ICD-10-CM | POA: Diagnosis not present

## 2012-10-06 DIAGNOSIS — R4701 Aphasia: Secondary | ICD-10-CM | POA: Diagnosis not present

## 2012-10-06 DIAGNOSIS — G8190 Hemiplegia, unspecified affecting unspecified side: Secondary | ICD-10-CM | POA: Diagnosis present

## 2012-10-06 DIAGNOSIS — I6340 Cerebral infarction due to embolism of unspecified cerebral artery: Principal | ICD-10-CM | POA: Diagnosis present

## 2012-10-06 DIAGNOSIS — I495 Sick sinus syndrome: Secondary | ICD-10-CM | POA: Diagnosis not present

## 2012-10-06 DIAGNOSIS — J329 Chronic sinusitis, unspecified: Secondary | ICD-10-CM

## 2012-10-06 DIAGNOSIS — E785 Hyperlipidemia, unspecified: Secondary | ICD-10-CM | POA: Diagnosis present

## 2012-10-06 DIAGNOSIS — G8191 Hemiplegia, unspecified affecting right dominant side: Secondary | ICD-10-CM | POA: Diagnosis present

## 2012-10-06 DIAGNOSIS — Z8572 Personal history of non-Hodgkin lymphomas: Secondary | ICD-10-CM | POA: Diagnosis present

## 2012-10-06 DIAGNOSIS — I2510 Atherosclerotic heart disease of native coronary artery without angina pectoris: Secondary | ICD-10-CM | POA: Diagnosis present

## 2012-10-06 DIAGNOSIS — I517 Cardiomegaly: Secondary | ICD-10-CM | POA: Diagnosis not present

## 2012-10-06 DIAGNOSIS — R29818 Other symptoms and signs involving the nervous system: Secondary | ICD-10-CM | POA: Diagnosis not present

## 2012-10-06 DIAGNOSIS — R7303 Prediabetes: Secondary | ICD-10-CM | POA: Diagnosis present

## 2012-10-06 DIAGNOSIS — R05 Cough: Secondary | ICD-10-CM | POA: Diagnosis not present

## 2012-10-06 DIAGNOSIS — Z7982 Long term (current) use of aspirin: Secondary | ICD-10-CM

## 2012-10-06 DIAGNOSIS — Z9861 Coronary angioplasty status: Secondary | ICD-10-CM

## 2012-10-06 DIAGNOSIS — R6889 Other general symptoms and signs: Secondary | ICD-10-CM | POA: Diagnosis not present

## 2012-10-06 DIAGNOSIS — I6789 Other cerebrovascular disease: Secondary | ICD-10-CM | POA: Diagnosis not present

## 2012-10-06 DIAGNOSIS — I708 Atherosclerosis of other arteries: Secondary | ICD-10-CM | POA: Diagnosis not present

## 2012-10-06 HISTORY — DX: Personal history of malignant neoplasm of bladder: Z85.51

## 2012-10-06 HISTORY — DX: Atherosclerotic heart disease of native coronary artery without angina pectoris: I25.10

## 2012-10-06 LAB — COMPREHENSIVE METABOLIC PANEL
AST: 42 U/L — ABNORMAL HIGH (ref 0–37)
Albumin: 2.8 g/dL — ABNORMAL LOW (ref 3.5–5.2)
BUN: 13 mg/dL (ref 6–23)
Calcium: 9.2 mg/dL (ref 8.4–10.5)
Creatinine, Ser: 0.82 mg/dL (ref 0.50–1.35)
Total Bilirubin: 0.2 mg/dL — ABNORMAL LOW (ref 0.3–1.2)

## 2012-10-06 LAB — POCT I-STAT TROPONIN I: Troponin i, poc: 0.01 ng/mL (ref 0.00–0.08)

## 2012-10-06 LAB — CBC
Hemoglobin: 11.8 g/dL — ABNORMAL LOW (ref 13.0–17.0)
MCH: 28.6 pg (ref 26.0–34.0)
Platelets: 160 10*3/uL (ref 150–400)
RBC: 4.12 MIL/uL — ABNORMAL LOW (ref 4.22–5.81)
WBC: 4.3 10*3/uL (ref 4.0–10.5)

## 2012-10-06 LAB — TROPONIN I: Troponin I: <0.3 ng/mL (ref <0.30)

## 2012-10-06 LAB — POCT I-STAT, CHEM 8
BUN: 13 mg/dL (ref 6–23)
Chloride: 103 mEq/L (ref 96–112)
Glucose, Bld: 144 mg/dL — ABNORMAL HIGH (ref 70–99)
HCT: 38 % — ABNORMAL LOW (ref 39.0–52.0)
Hemoglobin: 12.9 g/dL — ABNORMAL LOW (ref 13.0–17.0)
Sodium: 142 mEq/L (ref 135–145)

## 2012-10-06 LAB — RAPID URINE DRUG SCREEN, HOSP PERFORMED
Barbiturates: NOT DETECTED
Benzodiazepines: NOT DETECTED
Cocaine: NOT DETECTED

## 2012-10-06 LAB — PROTIME-INR
INR: 0.96 (ref 0.00–1.49)
Prothrombin Time: 12.7 seconds (ref 11.6–15.2)

## 2012-10-06 LAB — DIFFERENTIAL
Eosinophils Absolute: 0 10*3/uL (ref 0.0–0.7)
Lymphocytes Relative: 20 % (ref 12–46)
Lymphs Abs: 0.9 10*3/uL (ref 0.7–4.0)
Neutro Abs: 3.3 10*3/uL (ref 1.7–7.7)
Neutrophils Relative %: 76 % (ref 43–77)

## 2012-10-06 LAB — APTT: aPTT: 24 seconds (ref 24–37)

## 2012-10-06 MED ORDER — PANTOPRAZOLE SODIUM 40 MG IV SOLR
40.0000 mg | Freq: Every day | INTRAVENOUS | Status: DC
  Administered 2012-10-06: 40 mg via INTRAVENOUS
  Filled 2012-10-06 (×2): qty 40

## 2012-10-06 MED ORDER — ALTEPLASE (STROKE) FULL DOSE INFUSION: 0.9000 mg/kg | Freq: Once | INTRAVENOUS | Status: DC

## 2012-10-06 MED ORDER — POTASSIUM CHLORIDE 10 MEQ/100ML IV SOLN
10.0000 meq | INTRAVENOUS | Status: AC
  Administered 2012-10-06 (×2): 10 meq via INTRAVENOUS
  Filled 2012-10-06 (×2): qty 100

## 2012-10-06 MED ORDER — LABETALOL HCL 5 MG/ML IV SOLN: 10.0000 mg | INTRAVENOUS | Status: DC | PRN

## 2012-10-06 MED ORDER — SODIUM CHLORIDE 0.9 % IV SOLN
INTRAVENOUS | Status: DC
  Administered 2012-10-06: 1000 mL via INTRAVENOUS

## 2012-10-06 MED ORDER — ALTEPLASE (STROKE) FULL DOSE INFUSION
78.0000 mg | Freq: Once | INTRAVENOUS | Status: AC
  Administered 2012-10-06: 78 mg via INTRAVENOUS
  Filled 2012-10-06: qty 78

## 2012-10-06 MED ORDER — ACETAMINOPHEN 325 MG PO TABS: 650.0000 mg | ORAL_TABLET | ORAL | Status: DC | PRN

## 2012-10-06 MED ORDER — IOHEXOL 350 MG/ML SOLN
50.0000 mL | Freq: Once | INTRAVENOUS | Status: AC | PRN
  Administered 2012-10-06: 50 mL via INTRAVENOUS

## 2012-10-06 MED ORDER — ACETAMINOPHEN 650 MG RE SUPP: 650.0000 mg | RECTAL | Status: DC | PRN

## 2012-10-06 MED ORDER — SODIUM CHLORIDE 0.9 % IV BOLUS (SEPSIS)
500.0000 mL | Freq: Once | INTRAVENOUS | Status: AC
  Administered 2012-10-06: 17:00:00 via INTRAVENOUS

## 2012-10-06 MED ORDER — ONDANSETRON HCL 4 MG/2ML IJ SOLN: 4.0000 mg | Freq: Four times a day (QID) | INTRAMUSCULAR | Status: DC | PRN

## 2012-10-06 MED ORDER — SENNOSIDES-DOCUSATE SODIUM 8.6-50 MG PO TABS
1.0000 | ORAL_TABLET | Freq: Every evening | ORAL | Status: DC | PRN
  Filled 2012-10-06: qty 1

## 2012-10-06 MED ORDER — SODIUM CHLORIDE 0.9 % IV SOLN: 250.0000 mL | Freq: Once | INTRAVENOUS | Status: DC

## 2012-10-06 NOTE — ED Notes (Signed)
Checked patient cbg it was50 notified RN [facility] of blood sugar

## 2012-10-06 NOTE — ED Notes (Signed)
Silverio Lay, MD handed lab test results for Chem 8; Troponin still processing

## 2012-10-06 NOTE — ED Notes (Signed)
Patient transported to CT scan with stroke nurse.

## 2012-10-06 NOTE — Code Documentation (Signed)
Code Stroke called at 1557 Patient arrival 31 EDP exam 1617 Stroke Team arrival 1615 Last Seen Normal  1410 Pt arrival in CT 1620 Phlebotomist arrival 1614  Full dose TPA given at 1650  Patient arrived as code stroke with EMS.  Wife had seen patient normal before she left the house at 1410, when she returned he was unable to speak.  NIHSS 9  CT head done.  Post TPA initiated CTA head and neck done.  1830 pt transported to 3105 via bed with O2 and heart monitor.

## 2012-10-06 NOTE — ED Notes (Signed)
Dr. Reynolds at bedside.

## 2012-10-06 NOTE — ED Notes (Signed)
Silverio Lay, MD notified of Troponin results

## 2012-10-06 NOTE — ED Notes (Signed)
Code stroke - LSN 1410 by wife. Discovered symptoms at 1520. Pt unable to talk & with right facial droop.

## 2012-10-06 NOTE — H&P (Signed)
Admission H&P    Chief Complaint: Loss of speech HPI: Jeremy Parrish is an 71 y.o. male that was normal today.  His wife left to do some errands.  When she returned the patient was unable to speak.  EMS was called.  They felt he had a right facial droop and difficulty using his right side as well.  He was able to walk to the stretcher.  En route was felt to get his strength back on the right.  Patient was brought in as a code stroke still unable to speak.    LSN: [id number] tPA Given: Yes  Past Medical History  Diagnosis Date  . Hypertension   . Heart disease   . Kidney stones     history  . Lung mass   . Heart murmur   . Cancer   . Prostate cancer   . Multiple myeloma(203.0)   . 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 Prostate surgery   . Heart stents x 5   . Portacath placement   . Wrist surgery     right  . Left ear skin cancer removed   . Bone marrow transplant   . Pacemaker insertion     Family history: Both parents have died.  His father died of a MI.  Mother died after a stroke.  Has a sister that died with Alzheimers.  Another sister died of a MI.  He has a brother that is alive and well.    Social History:  reports that he quit smoking about 17 years ago. He has never used smokeless tobacco. He reports that he does not drink alcohol or use illicit drugs.  Allergies:  Allergies  Allergen Reactions  . Diphenhydramine Hcl     REACTION: "hyper"  . Morphine And Related Other (See Comments)    hallucinations   Medications: Current outpatient prescriptions:ALPRAZolam (XANAX) 0.5 MG tablet, Take 1 tablet (0.5 mg total) by mouth at bedtime as needed., Disp: 30 tablet, Rfl: 1;  amLODipine (NORVASC) 10 MG tablet, Take 10 mg by mouth daily.  , Disp: , Rfl: ;  aspirin 81 MG tablet, Take 81 mg by mouth daily.  , Disp: , Rfl: ;  citalopram (CELEXA) 40 MG tablet, Take 40 mg by  mouth daily.  , Disp: , Rfl:  clopidogrel (PLAVIX) 75 MG tablet, Take 75 mg by mouth daily.  , Disp: , Rfl: ;  diphenoxylate-atropine (LOMOTIL) 2.5-0.025 MG per tablet, Take 1 tablet by mouth 4 (four) times daily as needed for diarrhea or loose stools., Disp: 60 tablet, Rfl: 1;  Fluticasone-Salmeterol (ADVAIR) 250-50 MCG/DOSE AEPB, Inhale 1 puff into the lungs as needed.  , Disp: , Rfl:  gabapentin (NEURONTIN) 600 MG tablet, TAKE 1 TABLET BY MOUTH 3 TIMES A DAY AND TAKE 2 TABLETS AT BEDTIME, Disp: 150 tablet, Rfl: 5;  HYDROcodone-acetaminophen (NORCO/VICODIN) 5-325 MG per tablet, Take 1 tablet by mouth every 8 (eight) hours as needed for pain., Disp: 90 tablet, Rfl: 0;  ibuprofen (ADVIL,MOTRIN) 200 MG tablet, Take 200 mg by mouth every 4 (four) hours as needed.  , Disp: , Rfl:  KLOR-CON M20 20 MEQ tablet, TAKE 1 TABLET BY MOUTH TWICE A DAY, Disp: 60 each, Rfl: 3;  lansoprazole (PREVACID) 30 MG capsule, Take 30 mg by mouth daily.  , Disp: , Rfl: ;  lenalidomide (REVLIMID) 10 MG capsule, Take 1 capsule by mouth daily  for 7 days on followed by 7 days off, Disp: 14 capsule, Rfl: 3;  [EXPIRED] levofloxacin (LEVAQUIN) 500 MG tablet, Take 1 tablet (500 mg total) by mouth daily., Disp: 10 tablet, Rfl: 0 simvastatin (ZOCOR) 20 MG tablet, Take 20 mg by mouth at bedtime.  , Disp: , Rfl: ;  temazepam (RESTORIL) 30 MG capsule, Take 1 capsule (30 mg total) by mouth at bedtime as needed for sleep., Disp: 30 capsule, Rfl: 1;  Zoledronic Acid (ZOMETA) 4 MG/100ML IVPB, Inject 4 mg into the vein every 30 (thirty) days.  , Disp: , Rfl:   ROS: History obtained from wife  General ROS: negative for - chills, fatigue, fever, night sweats, weight gain or weight loss Psychological ROS: negative for - behavioral disorder, hallucinations, memory difficulties, mood swings or suicidal ideation Ophthalmic ROS: negative for - blurry vision, double vision, eye pain or loss of vision ENT ROS: negative for - epistaxis, nasal discharge,  oral lesions, sore throat, tinnitus or vertigo Allergy and Immunology ROS: negative for - hives or itchy/watery eyes Hematological and Lymphatic ROS: negative for - bleeding problems, bruising or swollen lymph nodes Endocrine ROS: negative for - galactorrhea, hair pattern changes, polydipsia/polyuria or temperature intolerance Respiratory ROS: negative for - cough, hemoptysis, shortness of breath or wheezing Cardiovascular ROS: negative for - chest pain, dyspnea on exertion, edema or irregular heartbeat Gastrointestinal ROS: negative for - abdominal pain, diarrhea, hematemesis, nausea/vomiting or stool incontinence Genito-Urinary ROS: negative for - dysuria, hematuria, incontinence or urinary frequency/urgency Musculoskeletal ROS: negative for - joint swelling or muscular weakness Neurological ROS: as noted in HPI Dermatological ROS: negative for rash and skin lesion changes  Physical Examination: BP-131/64                HR- 68                      RR- 15                 SpO2 98.00%.  General Examination: HEENT-  Normocephalic, no lesions, without obvious abnormality.  Normal external eye and conjunctiva.  Normal TM's bilaterally.  Normal auditory canals and external ears. Normal external nose, mucus membranes and septum.  Normal pharynx. Neck supple with no masses, nodes, nodules or enlargement. Cardiovascular - S1, S2 normal Lungs - chest clear, no wheezing, rales, normal symmetric air entry Abdomen - soft, non-tender; bowel sounds normal; no masses,  no organomegaly Extremities - no edema  Neurologic Examination: Mental Status: Alert.  Follows some commands but has difficulty with commands like finger to nose, etc.  Mute. Cranial Nerves: II: Discs flat bilaterally; RHH, pupils equal, round, reactive to light and accommodation III,IV, VI: ptosis not present, extra-ocular motions intact bilaterally V,VII: right facial droop, facial light touch sensation normal bilaterally VIII:  hearing normal bilaterally IX,X: gag reflex present XI: bilateral shoulder shrug XII: tongue deviation to the right Motor: Right : Upper extremity   5/5    Left:     Upper extremity   5/5  Lower extremity   5/5     Lower extremity   5/5 Tone and bulk:normal tone throughout; no atrophy noted Sensory: Pinprick and light touch intact throughout, bilaterally Deep Tendon Reflexes: 1+ and symmetric with absent AJ's bilaterally Plantars: Right: upgoing   Left: downgoing Cerebellar: normal finger-to-nose and normal heel-to-shin test Gait: not tested CV: pulses palpable throughout   Laboratory Studies:   Basic Metabolic Panel:  Lab 10/06/12 1478  NA 142  K 3.3*  CL 103  CO2 --  GLUCOSE 144*  BUN 13  CREATININE 0.90  CALCIUM --  MG --  PHOS --    Liver Function Tests: No results found for this basename: AST:5,ALT:5,ALKPHOS:5,BILITOT:5,PROT:5,ALBUMIN:5 in the last 168 hours No results found for this basename: LIPASE:5,AMYLASE:5 in the last 168 hours No results found for this basename: AMMONIA:3 in the last 168 hours  CBC:  Lab 10/06/12 1630 10/06/12 1620  WBC -- 4.3  NEUTROABS -- 3.3  HGB 12.9* 11.8*  HCT 38.0* 36.9*  MCV -- 89.6  PLT -- 160    Cardiac Enzymes: No results found for this basename: CKTOTAL:5,CKMB:5,CKMBINDEX:5,TROPONINI:5 in the last 168 hours  BNP: No components found with this basename: POCBNP:5  CBG: No results found for this basename: GLUCAP:5 in the last 168 hours  Microbiology: Results for orders placed in visit on 03/09/12  AFB CULTURE WITH SMEAR     Status: Normal   Collection Time   03/09/12 10:51 AM      Component Value Range Status Comment   Specimen Description SPUTUM EXPECTORATED   Final    Special Requests NONE   Final    ACID FAST SMEAR NO ACID FAST BACILLI SEEN   Final    Culture NO ACID FAST BACILLI ISOLATED IN 6 WEEKS   Final    Report Status 04/21/2012 FINAL   Final   FUNGUS CULTURE W SMEAR     Status: Normal   Collection  Time   03/09/12 10:51 AM      Component Value Range Status Comment   Specimen Description SPUTUM EXPECTORATED   Final    Special Requests NONE   Final    Fungal Smear NO YEAST OR FUNGAL ELEMENTS SEEN   Final    Culture YEAST CONSISTENT WITH CANDIDA SPECIES   Final    Report Status 04/03/2012 FINAL   Final     Coagulation Studies: No results found for this basename: LABPROT:5,INR:5 in the last 72 hours  Urinalysis: No results found for this basename: COLORURINE:2,APPERANCEUR:2,LABSPEC:2,PHURINE:2,GLUCOSEU:2,HGBUR:2,BILIRUBINUR:2,KETONESUR:2,PROTEINUR:2,UROBILINOGEN:2,NITRITE:2,LEUKOCYTESUR:2 in the last 168 hours  Lipid Panel:     Component Value Date/Time   CHOL  Value: 123        ATP III CLASSIFICATION:  <200     mg/dL   Desirable  440-102  mg/dL   Borderline High  >=725    mg/dL   High 3/66/4403 4742   TRIG 97 03/04/2008 0918   HDL 26* 03/04/2008 0918   CHOLHDL 4.7 03/04/2008 0918   VLDL 19 03/04/2008 0918   LDLCALC  Value: 78        Total Cholesterol/HDL:CHD Risk Coronary Heart Disease Risk Table                     Men   Women  1/2 Average Risk   3.4   3.3 03/04/2008 0918    HgbA1C:  No results found for this basename: HGBA1C    Urine Drug Screen:   No results found for this basename: labopia, cocainscrnur, labbenz, amphetmu, thcu, labbarb    Alcohol Level: No results found for this basename: ETH:2 in the last 168 hours  Imaging: Ct Head Wo Contrast  10/06/2012  *RADIOLOGY REPORT*  Clinical Data: right sided weakness and facial droop, aphasia, code stroke  CT HEAD WITHOUT CONTRAST  Technique:  Contiguous axial images were obtained from the base of the skull through the vertex without contrast.  Comparison: None.  Findings: There is no abnormal attenuation to suggest hemorrhage, infarct, or mass.  There is no hydrocephalus.  The calvarium is intact.     There is moderate diffuse atrophy, primarily in the frontal regions bilaterally.  IMPRESSION: No acute findings.  I discussed this  report with the patient's physician in the Emergency Department by phone at 1637.   Original Report Authenticated By: Esperanza Heir, M.D.     Assessment: 71 y.o. male presenting with loss of speech, mild difficulty following commands and right facial droop.  Head CT shows no acute findings.  Suspect a dominant hemisphere ischemic event.  Diagnosis discussed with family.  Treatment options discussed as well.  Risks and benefits of tPA discussed.  Verbal consent obtained from son and wife.  tPA administered once exclusion criteria reviewed.  Stroke Risk Factors - hypertension, CAD  Plan: 1. HgbA1c, fasting lipid panel 2. Repeat head CT in 24 hours 3. PT consult, OT consult, Speech consult 4. Echocardiogram 5. Carotid dopplers 6. Prophylactic therapy-None 7. Risk factor modification 8. Telemetry monitoring 9. Frequent neuro checks 10. CTA of the head  This patient is critically ill and at significant risk of neurological worsening, death and care requires constant monitoring of vital signs, hemodynamics,respiratory and cardiac monitoring, neurological assessment, discussion with family, other specialists and medical decision making of high complexity. I spent 90 minutes of neurocritical care time  in the care of  this patient.   Thana Farr, MD [facility] [phone] 10/06/2012, 4:42 PM

## 2012-10-06 NOTE — ED Provider Notes (Signed)
History     CSN: [id number]  Arrival date & time 10/06/12  1613   First MD Initiated Contact with Patient 10/06/12 1617      Chief Complaint  Patient presents with  . Code Stroke    (Consider location/radiation/quality/duration/timing/severity/associated sxs/prior treatment) The history is provided by a caregiver and the spouse. The history is limited by the condition of the patient.  Jeremy Parrish is a 71 y.o. male hx of CAD s/p stents, prostate Ca, HTN here with AMS. Around 2:10pm was his last seen normal. The wife left the house around that time and when she came back at 3:10 pm, he was unable to speak. He was on the couch and was unable to get up. He was moving his arms though. Unable to speak currently but understands commands.   Level V caveat- AMS    Past Medical History  Diagnosis Date  . Hypertension   . Heart disease   . Kidney stones     history  . Lung mass   . Heart murmur   . Cancer   . Prostate cancer   . Multiple myeloma(203.0)   . 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 Prostate surgery   . Heart stents x 5   . Portacath placement   . Wrist surgery     right  . Left ear skin cancer removed   . Bone marrow transplant   . Pacemaker insertion     No family history on file.  History  Substance Use Topics  . Smoking status: Former Smoker    Quit date: 11/14/1994  . Smokeless tobacco: Never Used  . Alcohol Use: No     Comment: previously drank but none for at least 15 years.      Review of Systems  Unable to perform ROS: Mental status change    Allergies  Diphenhydramine hcl and Morphine and related  Home Medications   Current Outpatient Rx  Name  Route  Sig  Dispense  Refill  . ALPRAZOLAM 0.5 MG PO TABS   Oral   Take 1 tablet (0.5 mg total) by mouth at bedtime as needed.   30 tablet   1   . AMLODIPINE BESYLATE 10 MG PO  TABS   Oral   Take 10 mg by mouth daily.           . ASPIRIN 81 MG PO TABS   Oral   Take 81 mg by mouth daily.           Marland Kitchen CITALOPRAM HYDROBROMIDE 40 MG PO TABS   Oral   Take 40 mg by mouth daily.           Marland Kitchen CLOPIDOGREL BISULFATE 75 MG PO TABS   Oral   Take 75 mg by mouth daily.           Marland Kitchen DIPHENOXYLATE-ATROPINE 2.5-0.025 MG PO TABS   Oral   Take 1 tablet by mouth 4 (four) times daily as needed for diarrhea or loose stools.   60 tablet   1   . FLUTICASONE-SALMETEROL 250-50 MCG/DOSE IN AEPB   Inhalation   Inhale 1 puff into the lungs as needed.           Marland Kitchen GABAPENTIN 600 MG PO TABS      TAKE 1 TABLET BY MOUTH 3 TIMES A DAY AND TAKE 2 TABLETS AT BEDTIME  150 tablet   5   . HYDROCODONE-ACETAMINOPHEN 5-325 MG PO TABS   Oral   Take 1 tablet by mouth every 8 (eight) hours as needed for pain.   90 tablet   0   . IBUPROFEN 200 MG PO TABS   Oral   Take 200 mg by mouth every 4 (four) hours as needed.           Marland Kitchen KLOR-CON M20 20 MEQ PO TBCR      TAKE 1 TABLET BY MOUTH TWICE A DAY   60 each   3   . LANSOPRAZOLE 30 MG PO CPDR   Oral   Take 30 mg by mouth daily.           Marland Kitchen LENALIDOMIDE 10 MG PO CAPS      Take 1 capsule by mouth daily for 7 days on followed by 7 days off   14 capsule   3   . LEVOFLOXACIN 500 MG PO TABS   Oral   Take 1 tablet (500 mg total) by mouth daily.   10 tablet   0   . SIMVASTATIN 20 MG PO TABS   Oral   Take 20 mg by mouth at bedtime.           Marland Kitchen TEMAZEPAM 30 MG PO CAPS   Oral   Take 1 capsule (30 mg total) by mouth at bedtime as needed for sleep.   30 capsule   1   . ZOLEDRONIC ACID 4 MG/100ML IV SOLN   Intravenous   Inject 4 mg into the vein every 30 (thirty) days.             BP 129/51  Pulse 63  Resp 19  Ht 5\' 9"  (1.753 m)  Wt 190 lb (86.183 kg)  BMI 28.06 kg/m2  SpO2 95%  Physical Exam  Nursing note and vitals reviewed. Constitutional: He appears well-developed and well-nourished.        Unable to speak   HENT:  Head: Normocephalic.  Mouth/Throat: Oropharynx is clear and moist.  Eyes: Conjunctivae normal are normal. Pupils are equal, round, and reactive to light.  Neck: Normal range of motion. Neck supple.  Cardiovascular: Normal rate, regular rhythm and normal heart sounds.   Pulmonary/Chest: Effort normal and breath sounds normal. No respiratory distress. He has no wheezes. He has no rales.  Abdominal: Soft. Bowel sounds are normal. He exhibits no distension. There is no tenderness. There is no rebound.  Musculoskeletal: Normal range of motion.  Neurological:       Expressive aphasia, unable to speak. + R facial droop. Has some dysmetria on exam. NL strength bilaterally. NL sensation bilaterally.   Skin: Skin is warm and dry.  Psychiatric:       Unable     ED Course  Procedures (including critical care time) CRITICAL CARE Performed by: Silverio Lay, Briyonna Omara   Total critical care time: 30 min   Critical care time was exclusive of separately billable procedures and treating other patients.  Critical care was necessary to treat or prevent imminent or life-threatening deterioration.  Critical care was time spent personally by me on the following activities: development of treatment plan with patient and/or surrogate as well as nursing, discussions with consultants, evaluation of patient's response to treatment, examination of patient, obtaining history from patient or surrogate, ordering and performing treatments and interventions, ordering and review of laboratory studies, ordering and review of radiographic studies, pulse oximetry and re-evaluation of patient's condition.  Labs Reviewed  CBC - Abnormal; Notable for the following:    RBC 4.12 (*)     Hemoglobin 11.8 (*)     HCT 36.9 (*)     All other components within normal limits  POCT I-STAT, CHEM 8 - Abnormal; Notable for the following:    Potassium 3.3 (*)     Glucose, Bld 144 (*)     Hemoglobin 12.9 (*)     HCT  38.0 (*)     All other components within normal limits  GLUCOSE, CAPILLARY - Abnormal; Notable for the following:    Glucose-Capillary 135 (*)     All other components within normal limits  APTT  DIFFERENTIAL  POCT I-STAT TROPONIN I  COMPREHENSIVE METABOLIC PANEL  TROPONIN I  PROTIME-INR  URINE RAPID DRUG SCREEN (HOSP PERFORMED)   Ct Head Wo Contrast  10/06/2012  *RADIOLOGY REPORT*  Clinical Data: right sided weakness and facial droop, aphasia, code stroke  CT HEAD WITHOUT CONTRAST  Technique:  Contiguous axial images were obtained from the base of the skull through the vertex without contrast.  Comparison: None.  Findings: There is no abnormal attenuation to suggest hemorrhage, infarct, or mass.  There is no hydrocephalus.  The calvarium is intact.     There is moderate diffuse atrophy, primarily in the frontal regions bilaterally.  IMPRESSION: No acute findings.  I discussed this report with the patient's physician in the Emergency Department by phone at 1637.   Original Report Authenticated By: Esperanza Heir, M.D.      1. Stroke      Date: 10/06/2012  Rate: 70  Rhythm: atrial paced  QRS Axis: normal  Intervals: normal  ST/T Wave abnormalities: normal  Conduction Disutrbances:none  Narrative Interpretation: + PVC  Old EKG Reviewed: unchanged    MDM  Jeremy Parrish is a 71 y.o. male here with expressive aphasia, R facial droop. Code stroke activated by EMS. Neuro at bedside. CT head showed no bleed. Neuro plans on giving him TPA. Will admit to neuro ICU.   4:53 PM TPA started, will admit to neuro ICU.          Richardean Canal, MD 10/06/12 [phone]

## 2012-10-07 ENCOUNTER — Inpatient Hospital Stay (HOSPITAL_COMMUNITY): Payer: Medicare Other

## 2012-10-07 ENCOUNTER — Encounter (HOSPITAL_COMMUNITY): Payer: Self-pay | Admitting: Cardiology

## 2012-10-07 DIAGNOSIS — Z8551 Personal history of malignant neoplasm of bladder: Secondary | ICD-10-CM

## 2012-10-07 DIAGNOSIS — I517 Cardiomegaly: Secondary | ICD-10-CM

## 2012-10-07 DIAGNOSIS — Z950 Presence of cardiac pacemaker: Secondary | ICD-10-CM

## 2012-10-07 DIAGNOSIS — I6350 Cerebral infarction due to unspecified occlusion or stenosis of unspecified cerebral artery: Secondary | ICD-10-CM

## 2012-10-07 HISTORY — DX: Personal history of malignant neoplasm of bladder: Z85.51

## 2012-10-07 LAB — LIPID PANEL
Cholesterol: 135 mg/dL (ref 0–200)
LDL Cholesterol: 74 mg/dL (ref 0–99)
VLDL: 36 mg/dL (ref 0–40)

## 2012-10-07 LAB — HEMOGLOBIN A1C: Hgb A1c MFr Bld: 5.9 % — ABNORMAL HIGH (ref <5.7)

## 2012-10-07 MED ORDER — IBUPROFEN 200 MG PO TABS
200.0000 mg | ORAL_TABLET | ORAL | Status: DC | PRN
  Filled 2012-10-07: qty 1

## 2012-10-07 MED ORDER — MOMETASONE FURO-FORMOTEROL FUM 100-5 MCG/ACT IN AERO
2.0000 | INHALATION_SPRAY | Freq: Two times a day (BID) | RESPIRATORY_TRACT | Status: DC
  Filled 2012-10-07 (×2): qty 8.8

## 2012-10-07 MED ORDER — AZITHROMYCIN 250 MG PO TABS
250.0000 mg | ORAL_TABLET | Freq: Every day | ORAL | Status: DC
  Administered 2012-10-07 – 2012-10-08 (×2): 250 mg via ORAL
  Filled 2012-10-07 (×2): qty 1

## 2012-10-07 MED ORDER — CLOPIDOGREL BISULFATE 75 MG PO TABS
75.0000 mg | ORAL_TABLET | Freq: Every day | ORAL | Status: DC
  Administered 2012-10-07 – 2012-10-08 (×2): 75 mg via ORAL
  Filled 2012-10-07 (×2): qty 1

## 2012-10-07 MED ORDER — PANTOPRAZOLE SODIUM 40 MG PO TBEC
40.0000 mg | DELAYED_RELEASE_TABLET | Freq: Every day | ORAL | Status: DC
  Administered 2012-10-07 – 2012-10-08 (×2): 40 mg via ORAL
  Filled 2012-10-07 (×2): qty 1

## 2012-10-07 MED ORDER — GABAPENTIN 400 MG PO CAPS
1200.0000 mg | ORAL_CAPSULE | Freq: Every day | ORAL | Status: DC
  Administered 2012-10-07: 1200 mg via ORAL
  Filled 2012-10-07 (×2): qty 3

## 2012-10-07 MED ORDER — SIMVASTATIN 20 MG PO TABS
20.0000 mg | ORAL_TABLET | Freq: Every day | ORAL | Status: DC
  Administered 2012-10-07: 20 mg via ORAL
  Filled 2012-10-07 (×3): qty 1

## 2012-10-07 MED ORDER — NITROGLYCERIN 0.4 MG SL SUBL: 0.4000 mg | SUBLINGUAL_TABLET | SUBLINGUAL | Status: DC | PRN

## 2012-10-07 MED ORDER — PREDNISONE 20 MG PO TABS: 20.0000 mg | ORAL_TABLET | Freq: Every day | ORAL | Status: DC

## 2012-10-07 MED ORDER — PREDNISONE 20 MG PO TABS
40.0000 mg | ORAL_TABLET | Freq: Every day | ORAL | Status: DC
  Administered 2012-10-07: 40 mg via ORAL
  Filled 2012-10-07 (×2): qty 2

## 2012-10-07 MED ORDER — ASPIRIN EC 81 MG PO TBEC
81.0000 mg | DELAYED_RELEASE_TABLET | Freq: Every day | ORAL | Status: DC
  Administered 2012-10-07 – 2012-10-08 (×2): 81 mg via ORAL
  Filled 2012-10-07 (×2): qty 1

## 2012-10-07 MED ORDER — POTASSIUM CHLORIDE CRYS ER 20 MEQ PO TBCR
20.0000 meq | EXTENDED_RELEASE_TABLET | Freq: Two times a day (BID) | ORAL | Status: DC
  Administered 2012-10-07 – 2012-10-08 (×2): 20 meq via ORAL
  Filled 2012-10-07 (×3): qty 1

## 2012-10-07 MED ORDER — GABAPENTIN 600 MG PO TABS: 600.0000 mg | ORAL_TABLET | Freq: Four times a day (QID) | ORAL | Status: DC

## 2012-10-07 MED ORDER — TEMAZEPAM 15 MG PO CAPS: 30.0000 mg | ORAL_CAPSULE | Freq: Every evening | ORAL | Status: DC | PRN

## 2012-10-07 MED ORDER — GABAPENTIN 300 MG PO CAPS
600.0000 mg | ORAL_CAPSULE | ORAL | Status: DC
  Administered 2012-10-08: 600 mg via ORAL
  Filled 2012-10-07 (×4): qty 2

## 2012-10-07 MED ORDER — LEVOFLOXACIN 500 MG PO TABS: 500.0000 mg | ORAL_TABLET | Freq: Every day | ORAL | Status: DC

## 2012-10-07 MED ORDER — LEVOFLOXACIN 750 MG PO TABS
750.0000 mg | ORAL_TABLET | Freq: Every day | ORAL | Status: DC
  Administered 2012-10-07 – 2012-10-08 (×2): 750 mg via ORAL
  Filled 2012-10-07 (×3): qty 1

## 2012-10-07 MED ORDER — CITALOPRAM HYDROBROMIDE 40 MG PO TABS
40.0000 mg | ORAL_TABLET | Freq: Every day | ORAL | Status: DC
  Administered 2012-10-07 – 2012-10-08 (×2): 40 mg via ORAL
  Filled 2012-10-07 (×2): qty 1

## 2012-10-07 MED ORDER — AMLODIPINE BESYLATE 10 MG PO TABS
10.0000 mg | ORAL_TABLET | Freq: Every day | ORAL | Status: DC
  Administered 2012-10-07: 10 mg via ORAL
  Filled 2012-10-07 (×2): qty 1

## 2012-10-07 NOTE — Consult Note (Signed)
Reason for Consult: Embolic CVA poss PAF as cause  Referring Physician:  Dr. Bishop [name] is an 71 y.o. male.    Chief Complaint:  Unable to speak on admit 10/06/12 due to CVA HPI: [name] is an 71 y.o. male that was normal 10/06/12. His wife left to do some errands. When she returned the patient was unable to speak. EMS was called. They felt he had a right facial droop and difficulty using his right side as well. He was able to walk to the stretcher. En route was felt to get his strength back on the right. Patient was brought in as a code stroke still unable to speak.  Pt did receive TPA.    Cardiac history does include mild ischemic CM with EF 45%, CAD with multiple interventions -stents to RCA and LCX.  Last Cath 2007 with patent stents and 30% pox. LAD stenosis.  Last nuc  Persantine 11/2007 was stable, no change from previous.  He has Medtronic PPM MRI compatible.  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  No history of Atrial fib.  Pt did not have chest pain or SOB.  Had been doing quite well until yesterday.  Currently no chest pain, no SOB.  Medically with history of bladder cancer, lymphoma and multiple myeloma and has received a bone marrow transplant.      Past Medical History  Diagnosis Date  . Hypertension   . Heart disease   . Kidney stones     history  . Lung mass   . Heart murmur   . Cancer   . Prostate cancer   . Multiple myeloma(203.0)   . 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 Coronary artery disease   . Hx of bladder cancer 10/07/2012    Past Surgical History  Procedure Date  . Prostate surgery   . Heart stents x 5   . Portacath placement   . Wrist surgery     right  . Left ear skin cancer removed   . Bone marrow transplant   . Pacemaker insertion   . Coronary angioplasty   . Insert / replace / remove pacemaker     History reviewed. No pertinent family history. Social  History:  reports that he quit smoking about 17 years ago. He has never used smokeless tobacco. He reports that he does not drink alcohol or use illicit drugs. married, father of 3   Allergies:  Allergies  Allergen Reactions  . Diphenhydramine Hcl     REACTION: "hyper"  . Morphine And Related Other (See Comments)    hallucinations    Medications Prior to Admission  Medication Sig Dispense Refill  . amLODipine (NORVASC) 10 MG tablet Take 10 mg by mouth daily.        Marland Kitchen aspirin 81 MG tablet Take 81 mg by mouth daily.        Marland Kitchen azithromycin (ZITHROMAX) 250 MG tablet Take 250 mg by mouth daily.      . citalopram (CELEXA) 40 MG tablet Take 40 mg by mouth daily.        . clopidogrel (PLAVIX) 75 MG tablet Take 75 mg by mouth daily.        . Fluticasone-Salmeterol (ADVAIR) 250-50 MCG/DOSE AEPB Inhale 1 puff into the lungs as needed.        . gabapentin (NEURONTIN) 600 MG tablet Take 600-1,200 mg by mouth 4 (four) times daily. Takes 1  tablet three times a day and 2 tablets at bedtime      . ibuprofen (ADVIL,MOTRIN) 200 MG tablet Take 200 mg by mouth every 4 (four) hours as needed.        Marland Kitchen lenalidomide (REVLIMID) 10 MG capsule Take 10 mg by mouth daily. Take 1 capsule by mouth daily for 7 days on followed by 7 days off      . moxifloxacin (AVELOX) 400 MG tablet Take 400 mg by mouth daily.      . nitroGLYCERIN (NITROSTAT) 0.4 MG SL tablet Place 0.4 mg under the tongue every 5 (five) minutes as needed. For chest pain      . omeprazole (PRILOSEC) 40 MG capsule Take 40 mg by mouth daily.      Marland Kitchen oxyCODONE-acetaminophen (PERCOCET/ROXICET) 5-325 MG per tablet Take 1 tablet by mouth every 4 (four) hours as needed. For pain      . potassium chloride SA (K-DUR,KLOR-CON) 20 MEQ tablet Take 20 mEq by mouth 2 (two) times daily.      . predniSONE (DELTASONE) 20 MG tablet Take 20-60 mg by mouth daily. Take 3 tabs daily for 3 days, 2 tabs daily for 3 days, then 1 tab for 2 days.      . simvastatin (ZOCOR) 20 MG  tablet Take 20 mg by mouth at bedtime.        Marland Kitchen zolpidem (AMBIEN) 5 MG tablet Take 5 mg by mouth at bedtime as needed. For sleep      . [DISCONTINUED] lenalidomide (REVLIMID) 10 MG capsule Take 1 capsule by mouth daily for 7 days on followed by 7 days off  14 capsule  3  . [EXPIRED] levofloxacin (LEVAQUIN) 500 MG tablet Take 1 tablet (500 mg total) by mouth daily.  10 tablet  0  . temazepam (RESTORIL) 30 MG capsule Take 1 capsule (30 mg total) by mouth at bedtime as needed for sleep.  30 capsule  1  . Zoledronic Acid (ZOMETA) 4 MG/100ML IVPB Inject 4 mg into the vein every 30 (thirty) days.          Results for orders placed during the hospital encounter of 10/06/12 (from the past 48 hour(s))  TROPONIN I     Status: Normal   Collection Time   10/06/12  4:19 PM      Component Value Range Comment   Troponin I <0.30  <0.30 ng/mL   APTT     Status: Normal   Collection Time   10/06/12  4:20 PM      Component Value Range Comment   aPTT 24  24 - 37 seconds   CBC     Status: Abnormal   Collection Time   10/06/12  4:20 PM      Component Value Range Comment   WBC 4.3  4.0 - 10.5 K/uL    RBC 4.12 (*) 4.22 - 5.81 MIL/uL    Hemoglobin 11.8 (*) 13.0 - 17.0 g/dL    HCT 16.1 (*) 09.6 - 52.0 %    MCV 89.6  78.0 - 100.0 fL    MCH 28.6  26.0 - 34.0 pg    MCHC 32.0  30.0 - 36.0 g/dL    RDW 04.5  40.9 - 81.1 %    Platelets 160  150 - 400 K/uL   DIFFERENTIAL     Status: Normal   Collection Time   10/06/12  4:20 PM      Component Value Range Comment   Neutrophils Relative 76  43 -  77 %    Neutro Abs 3.3  1.7 - 7.7 K/uL    Lymphocytes Relative 20  12 - 46 %    Lymphs Abs 0.9  0.7 - 4.0 K/uL    Monocytes Relative 4  3 - 12 %    Monocytes Absolute 0.2  0.1 - 1.0 K/uL    Eosinophils Relative 0  0 - 5 %    Eosinophils Absolute 0.0  0.0 - 0.7 K/uL    Basophils Relative 0  0 - 1 %    Basophils Absolute 0.0  0.0 - 0.1 K/uL   COMPREHENSIVE METABOLIC PANEL     Status: Abnormal   Collection Time    10/06/12  4:20 PM      Component Value Range Comment   Sodium 137  135 - 145 mEq/L    Potassium 3.1 (*) 3.5 - 5.1 mEq/L    Chloride 100  96 - 112 mEq/L    CO2 28  19 - 32 mEq/L    Glucose, Bld 151 (*) 70 - 99 mg/dL    BUN 13  6 - 23 mg/dL    Creatinine, Ser 4.09  0.50 - 1.35 mg/dL    Calcium 9.2  8.4 - 81.1 mg/dL    Total Protein 7.9  6.0 - 8.3 g/dL    Albumin 2.8 (*) 3.5 - 5.2 g/dL    AST 42 (*) 0 - 37 U/L    ALT 58 (*) 0 - 53 U/L    Alkaline Phosphatase 74  39 - 117 U/L    Total Bilirubin 0.2 (*) 0.3 - 1.2 mg/dL    GFR calc non Af Amer 88 (*) >90 mL/min    GFR calc Af Amer >90  >90 mL/min   PROTIME-INR     Status: Normal   Collection Time   10/06/12  4:20 PM      Component Value Range Comment   Prothrombin Time 12.7  11.6 - 15.2 seconds    INR 0.96  0.00 - 1.49   POCT I-STAT, CHEM 8     Status: Abnormal   Collection Time   10/06/12  4:30 PM      Component Value Range Comment   Sodium 142  135 - 145 mEq/L    Potassium 3.3 (*) 3.5 - 5.1 mEq/L    Chloride 103  96 - 112 mEq/L    BUN 13  6 - 23 mg/dL    Creatinine, Ser 9.14  0.50 - 1.35 mg/dL    Glucose, Bld 782 (*) 70 - 99 mg/dL    Calcium, Ion 9.56  2.13 - 1.30 mmol/L    TCO2 25  0 - 100 mmol/L    Hemoglobin 12.9 (*) 13.0 - 17.0 g/dL    HCT 08.6 (*) 57.8 - 52.0 %   POCT I-STAT TROPONIN I     Status: Normal   Collection Time   10/06/12  4:33 PM      Component Value Range Comment   Troponin i, poc 0.01  0.00 - 0.08 ng/mL    Comment 3            GLUCOSE, CAPILLARY     Status: Abnormal   Collection Time   10/06/12  4:41 PM      Component Value Range Comment   Glucose-Capillary 135 (*) 70 - 99 mg/dL   URINE RAPID DRUG SCREEN (HOSP PERFORMED)     Status: Normal   Collection Time   10/06/12  6:16 PM  Component Value Range Comment   Opiates NONE DETECTED  NONE DETECTED    Cocaine NONE DETECTED  NONE DETECTED    Benzodiazepines NONE DETECTED  NONE DETECTED    Amphetamines NONE DETECTED  NONE DETECTED     Tetrahydrocannabinol NONE DETECTED  NONE DETECTED    Barbiturates NONE DETECTED  NONE DETECTED   MRSA PCR SCREENING     Status: Normal   Collection Time   10/06/12  6:42 PM      Component Value Range Comment   MRSA by PCR NEGATIVE  NEGATIVE   HEMOGLOBIN A1C     Status: Abnormal   Collection Time   10/07/12  4:25 AM      Component Value Range Comment   Hemoglobin A1C 5.9 (*) <5.7 %    Mean Plasma Glucose 123 (*) <117 mg/dL   LIPID PANEL     Status: Abnormal   Collection Time   10/07/12  4:25 AM      Component Value Range Comment   Cholesterol 135  0 - 200 mg/dL    Triglycerides 161 (*) <150 mg/dL    HDL 25 (*) >09 mg/dL    Total CHOL/HDL Ratio 5.4      VLDL 36  0 - 40 mg/dL    LDL Cholesterol 74  0 - 99 mg/dL    Ct Angio Head W/cm &/or Wo Cm  10/06/2012  *RADIOLOGY REPORT*  Clinical Data:  71 year old male with right facial droop.  Last seen normal at 1410 hours.  History of lymphoma. Status post IV t- PA.  CT ANGIOGRAPHY HEAD AND NECK  Technique:  Multidetector CT imaging of the head and neck was performed using the standard protocol during bolus administration of intravenous contrast.  Multiplanar CT image reconstructions including MIPs were obtained to evaluate the vascular anatomy. Carotid stenosis measurements (when applicable) are obtained utilizing NASCET criteria, using the distal internal carotid diameter as the denominator.  Contrast: 50mL OMNIPAQUE IOHEXOL 350 MG/ML SOLN  Comparison:  Head CT without contrast 1619 hours same day.  CTA NECK  Findings:  Left chest cardiac pacemaker.  Dependent pulmonary atelectasis.  Mediastinal lipomatosis.  No superior mediastinal lymphadenopathy.  Sub centimeter left thyroid lobe nodule of doubtful significance.  Right chest Port-A-Cath.  Larynx, pharynx, parapharyngeal spaces, retropharyngeal space, sublingual space, submandibular glands, parotid glands, and orbit soft tissues are within normal limits.  Sphenoid sinus mucoperiosteal thickening  mild bubbly opacity.  Other Visualized paranasal sinuses and mastoids are clear.  No cervical lymphadenopathy.  Multilevel degenerative changes in the cervical spine. No acute osseous abnormality identified.  Vascular Findings: Three-vessel arch configuration.  No brachial cephalic artery origin stenosis.  Tortuous but otherwise normal right common carotid artery origin. Soft and calcified plaque at the right carotid bifurcation with no hemodynamically significant stenosis.  Otherwise normal cervical right ICA.  Calcified plaque at the right subclavian artery origin resulting in less than 50% stenosis.  Tortuous right SCA also with a kinked appearance.  Right vertebral artery origin is normal.  Normal cervical right vertebral artery.  Normal left common carotid artery origin.  Calcified plaque at the left carotid bifurcation with no significant stenosis.  Tortuous cervical left ICA otherwise is within normal limits.  Calcified plaque at the left subclavian artery origin with no significant stenosis.  Calcified plaque at the left vertebral artery origin with mild stenosis.  Calcified plaque in the left V1 segment with up to moderate stenosis.  Venous contrast in the left paravertebral veins.  No other left  vertebral artery plaque or stenosis in the neck.   Review of the MIP images confirms the above findings.  IMPRESSION: 1.  Atherosclerosis in the aortic arch and neck.  Proximal left vertebral artery stenosis is mild to moderate, but otherwise no significant stenosis in the neck. 2.   See intracranial findings are below. 3.  Advanced cervical spine degenerative changes.  CTA HEAD  Findings:  There does appear to be subtle loss of gray-white matter differentiation in the left frontal operculum (series 7 image 20) and extending cephalad along the left periRolandic cortex.  The vertex may be spared.  These changes are new / increased.  No mass effect or associated hemorrhage.  No ventriculomegaly. No abnormal  enhancement identified.  Vascular Findings: Major intracranial venous structures appear normally enhancing.  Mild calcified plaque in the distal left vertebral arteries inside the skull base with no significant stenosis.  Normal left PICA. Dominant right AICA.  Patent vertebrobasilar junction.  No basilar stenosis.  SCA and PCA origins are within normal limits.  Posterior communicating arteries are diminutive or absent.  Bilateral PCA branches are within normal limits.  Right ICA siphon is patent with moderate calcified atherosclerosis. Stenosis is less than 50 % with respect to the distal vessel. Right carotid terminus is patent.  There is a diminutive right posterior communicating artery within normal origin.  Right ophthalmic artery origin is within normal limits.  Left ICA siphon is patent with moderate calcified plaque. Subsequent stenosis less than 50 % with respect to the distal vessel.  Left ophthalmic and diminutive left posterior communicating artery origins are normal.  Patent carotid termini.  MCA and ACA origins are within normal limits.  Anterior communicating artery within normal limits. Tortuous proximal ACA branches.  ACA otherwise within normal limits.  Right MCA M1 segment within normal limits.  Right MCA branches within normal limits.  Left MCA M1 segment is patent and within normal limits.  Left MCA bifurcation is patent.  No left MCA branch stenosis or occlusion identified.   Review of the MIP images confirms the above findings.  IMPRESSION:  1.  Early CT changes the left MCA infarct.  No mass effect or hemorrhage. 2.  No left MCA stenosis or major branch occlusion identified. 3.  ICA siphon and distal vertebral artery atherosclerosis, but no significant intracranial stenosis and otherwise negative intracranial CTA.  Study discussed with Dr. Thana Farr at 1800 hours on 10/06/2012.   Original Report Authenticated By: Erskine Speed, M.D.    Ct Head Wo Contrast  10/06/2012  *RADIOLOGY  REPORT*  Clinical Data: right sided weakness and facial droop, aphasia, code stroke  CT HEAD WITHOUT CONTRAST  Technique:  Contiguous axial images were obtained from the base of the skull through the vertex without contrast.  Comparison: None.  Findings: There is no abnormal attenuation to suggest hemorrhage, infarct, or mass.  There is no hydrocephalus.  The calvarium is intact.     There is moderate diffuse atrophy, primarily in the frontal regions bilaterally.  IMPRESSION: No acute findings.  I discussed this report with the patient's physician in the Emergency Department by phone at 1637.   Original Report Authenticated By: Esperanza Heir, M.D.    Ct Angio Neck W/cm &/or Wo/cm  10/06/2012  *RADIOLOGY REPORT*  Clinical Data:  71 year old male with right facial droop.  Last seen normal at 1410 hours.  History of lymphoma. Status post IV t- PA.  CT ANGIOGRAPHY HEAD AND NECK  Technique:  Multidetector CT imaging  of the head and neck was performed using the standard protocol during bolus administration of intravenous contrast.  Multiplanar CT image reconstructions including MIPs were obtained to evaluate the vascular anatomy. Carotid stenosis measurements (when applicable) are obtained utilizing NASCET criteria, using the distal internal carotid diameter as the denominator.  Contrast: 50mL OMNIPAQUE IOHEXOL 350 MG/ML SOLN  Comparison:  Head CT without contrast 1619 hours same day.  CTA NECK  Findings:  Left chest cardiac pacemaker.  Dependent pulmonary atelectasis.  Mediastinal lipomatosis.  No superior mediastinal lymphadenopathy.  Sub centimeter left thyroid lobe nodule of doubtful significance.  Right chest Port-A-Cath.  Larynx, pharynx, parapharyngeal spaces, retropharyngeal space, sublingual space, submandibular glands, parotid glands, and orbit soft tissues are within normal limits.  Sphenoid sinus mucoperiosteal thickening mild bubbly opacity.  Other Visualized paranasal sinuses and mastoids are clear.  No  cervical lymphadenopathy.  Multilevel degenerative changes in the cervical spine. No acute osseous abnormality identified.  Vascular Findings: Three-vessel arch configuration.  No brachial cephalic artery origin stenosis.  Tortuous but otherwise normal right common carotid artery origin. Soft and calcified plaque at the right carotid bifurcation with no hemodynamically significant stenosis.  Otherwise normal cervical right ICA.  Calcified plaque at the right subclavian artery origin resulting in less than 50% stenosis.  Tortuous right SCA also with a kinked appearance.  Right vertebral artery origin is normal.  Normal cervical right vertebral artery.  Normal left common carotid artery origin.  Calcified plaque at the left carotid bifurcation with no significant stenosis.  Tortuous cervical left ICA otherwise is within normal limits.  Calcified plaque at the left subclavian artery origin with no significant stenosis.  Calcified plaque at the left vertebral artery origin with mild stenosis.  Calcified plaque in the left V1 segment with up to moderate stenosis.  Venous contrast in the left paravertebral veins.  No other left vertebral artery plaque or stenosis in the neck.   Review of the MIP images confirms the above findings.  IMPRESSION: 1.  Atherosclerosis in the aortic arch and neck.  Proximal left vertebral artery stenosis is mild to moderate, but otherwise no significant stenosis in the neck. 2.   See intracranial findings are below. 3.  Advanced cervical spine degenerative changes.  CTA HEAD  Findings:  There does appear to be subtle loss of gray-white matter differentiation in the left frontal operculum (series 7 image 20) and extending cephalad along the left periRolandic cortex.  The vertex may be spared.  These changes are new / increased.  No mass effect or associated hemorrhage.  No ventriculomegaly. No abnormal enhancement identified.  Vascular Findings: Major intracranial venous structures appear  normally enhancing.  Mild calcified plaque in the distal left vertebral arteries inside the skull base with no significant stenosis.  Normal left PICA. Dominant right AICA.  Patent vertebrobasilar junction.  No basilar stenosis.  SCA and PCA origins are within normal limits.  Posterior communicating arteries are diminutive or absent.  Bilateral PCA branches are within normal limits.  Right ICA siphon is patent with moderate calcified atherosclerosis. Stenosis is less than 50 % with respect to the distal vessel. Right carotid terminus is patent.  There is a diminutive right posterior communicating artery within normal origin.  Right ophthalmic artery origin is within normal limits.  Left ICA siphon is patent with moderate calcified plaque. Subsequent stenosis less than 50 % with respect to the distal vessel.  Left ophthalmic and diminutive left posterior communicating artery origins are normal.  Patent carotid termini.  MCA and ACA origins are within normal limits.  Anterior communicating artery within normal limits. Tortuous proximal ACA branches.  ACA otherwise within normal limits.  Right MCA M1 segment within normal limits.  Right MCA branches within normal limits.  Left MCA M1 segment is patent and within normal limits.  Left MCA bifurcation is patent.  No left MCA branch stenosis or occlusion identified.   Review of the MIP images confirms the above findings.  IMPRESSION:  1.  Early CT changes the left MCA infarct.  No mass effect or hemorrhage. 2.  No left MCA stenosis or major branch occlusion identified. 3.  ICA siphon and distal vertebral artery atherosclerosis, but no significant intracranial stenosis and otherwise negative intracranial CTA.  Study discussed with Dr. Thana Farr at 1800 hours on 10/06/2012.   Original Report Authenticated By: Erskine Speed, M.D.    Dg Chest Port 1 View  10/06/2012  *RADIOLOGY REPORT*  Clinical Data: Stroke.  Cough.  PORTABLE CHEST - 1 VIEW  Comparison: 10/18/2011   Findings: Right Port-A-Cath and left pacer remain in place, unchanged.  Studies AP lordotic in positioning, which accentuates heart size and basilar markings.  Suspect basilar scarring or atelectasis.  No confluent opacities otherwise.  No effusions or acute bony abnormality.  IMPRESSION: Bibasilar scarring or atelectasis.   Original Report Authenticated By: Charlett Nose, M.D.     ROS: General:recent coldsrec'd antibiotics, no weight changes Skin:no rashes or ulcers HEENT:no blurred vision, no congestion CV:see HPI PUL:see HPI GI:no diarrhea constipation or melena, no indigestion GU:no hematuria, no dysuria MS:no joint pain, no claudication Neuro:no syncope, no lightheadedness Endo:no diabetes, no thyroid disease   Blood pressure 127/66, pulse 69, temperature 97.7 F (36.5 C), temperature source Oral, resp. rate 20, height 5\' 8"  (1.727 m), weight 88 kg (194 lb 0.1 oz), SpO2 97.00%. PE: General:alert shakes head approp. To answer question.  NAD, unable to speak  Skin:warm and dry, brisk capillary refill HEENT:normocephlaic, sclera clear Neck:suppleno JVD no bruits Heart:S1S2 RRR, no obvious murmur, gallup or rub Lungs:clear without rales or rhonchi Abd:+ BS, soft, non tender Ext:no edema, MAE Neuro:unable to speak, MAE, follows commands    Assessment/Plan Principal Problem:  *Cerebral embolism with cerebral infarction Active Problems:  LYMPHOMA  MULTIPLE  MYELOMA  HYPERTENSION  CAD, dating to 1996 with multiple PCIs, Stents to RCA and LCX, Last cath 2007 patent stents,30% prox LAD lesion, EF 45%  [place]  H/O cardiac pacemaker, Medtronic REVO, MRI conditional device, 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  This is a MRI conditional device with medtronic MRI leads. Pt may undergo MRI but medtronic rep must be called by XRAY prior to beginning to adjust.     Pt rec'd TPA , unable to speak.   Echo pending. Will need  anticoagultion.  MD to see.  Addendum:  Pacemaker interrogated no tachycardia above 170 beats per min. High alarm rate decreased to 150.  1 episode on NSVT of 14 sec on the 21st of Nov.   No A. Fib noted.  Currently pt having long episodes of Junct. Rhythm.  Xhaiden Coombs R 10/07/2012, 12:05 PM

## 2012-10-07 NOTE — Progress Notes (Signed)
Utilization review completed.  P.J. Wes Lezotte,RN,BSN Case Manager [phone]  

## 2012-10-07 NOTE — Progress Notes (Signed)
Patient ID: Jeremy Parrish, male   DOB: 1940-12-07, 71 y.o.   MRN: [id number] [name], PA-C Physician Assistant Cosign Needed Neurology Progress Notes 10/07/2012 8:23 AM  HISTORY This 71 year old male presented to the [facility] emergency department with right-sided weakness and a right facial droop. He was unable to speak. He was last seen normal at 1410 hours. The patient was seen in consultation by Dr. Thad Ranger and received intravenous TPA.   SUBJECTIVE The patient's wife was at the bedside today when seen by Dr. Pearlean Brownie.   His condition appears to have improved significantly; however, he continues to have significant problems with expressive aphasia. The patient has a permanent pacemaker and is followed by Dr. York Ram. The pacemaker is reportedly MRI compatible.   OBJECTIVE Most recent Vital Signs: Filed Vitals:     10/07/12 0600  10/07/12 0700  10/07/12 0800  10/07/12 0811   BP:  141/65  139/79  132/83     Pulse:  64  62  77     Temp:        97.7 F (36.5 C)   TempSrc:        Oral   Resp:  18  17  20      Height:           Weight:           SpO2:  93%  98%  94%      CBG (last 3)  Basename  10/06/12 1641   GLUCAP  135*      IV Fluid Intake:      .  sodium chloride  1,000 mL (10/06/12 1900)      MEDICATIONS     .  sodium chloride   250 mL  Intravenous  Once   .  [COMPLETED] alteplase   78 mg  Intravenous  Once   .  pantoprazole (PROTONIX) IV   40 mg  Intravenous  QHS   .  [COMPLETED] potassium chloride   10 mEq  Intravenous  Q1 Hr x 2   .  [COMPLETED] sodium chloride   500 mL  Intravenous  Once   .  [DISCONTINUED] alteplase   0.9 mg/kg  Intravenous  Once    PRN:  acetaminophen, acetaminophen, [COMPLETED] iohexol, labetalol, ondansetron (ZOFRAN) IV, senna-docusate  Diet:  Cardiac with thin liquids Activity:  Bedrest DVT Prophylaxis:  SCD's   CLINICALLY SIGNIFICANT STUDIES Basic Metabolic Panel:  Lab  10/06/12 1630  10/06/12 1620   NA  142   137   K  3.3*  3.1*   CL  103  100   CO2  --  28   GLUCOSE  144*  151*   BUN  13  13   CREATININE  0.90  0.82   CALCIUM  --  9.2   MG  --  --   PHOS  --  --    Liver Function Tests:  Lab  10/06/12 1620   AST  42*   ALT  58*   ALKPHOS  74   BILITOT  0.2*   PROT  7.9   ALBUMIN  2.8*    CBC:  Lab  10/06/12 1630  10/06/12 1620   WBC  --  4.3   NEUTROABS  --  3.3   HGB  12.9*  11.8*   HCT  38.0*  36.9*   MCV  --  89.6   PLT  --  160    Coagulation:  Lab  10/06/12  1620   LABPROT  12.7   INR  0.96    Cardiac Enzymes:  Lab  10/06/12 1619   CKTOTAL  --   CKMB  --   CKMBINDEX  --   TROPONINI  <0.30    Urinalysis: No results found for this basename: COLORURINE:2,APPERANCEUR:2,LABSPEC:2,PHURINE:2,GLUCOSEU:2,HGBUR:2,BILIRUBINUR:2,KETONESUR:2,PROTEINUR:2,UROBILINOGEN:2,NITRITE:2,LEUKOCYTESUR:2 in the last 168 hours Lipid Panel    Component  Value  Date/Time     CHOL  135  10/07/2012 0425     TRIG  181*  10/07/2012 0425     HDL  25*  10/07/2012 0425     CHOLHDL  5.4  10/07/2012 0425     VLDL  36  10/07/2012 0425     LDLCALC  74  10/07/2012 0425    HgbA1C   No results found for this basename: HGBA1C       Urine Drug Screen:     Component  Value  Date/Time     LABOPIA  NONE DETECTED  10/06/2012 1816     COCAINSCRNUR  NONE DETECTED  10/06/2012 1816     LABBENZ  NONE DETECTED  10/06/2012 1816     AMPHETMU  NONE DETECTED  10/06/2012 1816     THCU  NONE DETECTED  10/06/2012 1816     LABBARB  NONE DETECTED  10/06/2012 1816      Alcohol Level: No results found for this basename: ETH:2 in the last 168 hours   Ct Angio Head W/cm &/or Wo Cm   10/06/2012 CT ANGIOGRAPHY HEAD AND NECK    IMPRESSION: 1.  Atherosclerosis in the aortic arch and neck.  Proximal left vertebral artery stenosis is mild to moderate, but otherwise no significant stenosis in the neck. 2.   See intracranial findings are below. 3.  Advanced cervical spine degenerative changes.  CTA HEAD  Findings:   There does appear to be subtle loss of gray-white matter differentiation in the left frontal operculum (s  IMPRESSION:  1.  Early CT changes the left MCA infarct.  No mass effect or hemorrhage. 2.  No left MCA stenosis or major branch occlusion identified. 3.  ICA siphon and distal vertebral artery atherosclerosis, but no significant intracranial stenosis and otherwise negative intracranial CTA.  Study discussed with Dr. Thana Farr at 1800 hours on 10/06/2012.   Original Report Authenticated By: Erskine Speed, M.D.       10/06/2012 CT HEAD WITHOUT CONTRAST   IMPRESSION: No acute findings.  I discussed this report with the patient's physician in the Emergency Department by phone at 1637.   Original Report Authenticated By: Esperanza Heir, M.D.       10/06/2012   .  CT ANGIOGRAPHY HEAD AND NECK   IMPRESSION:  1.  Early CT changes the left MCA infarct.  No mass effect or hemorrhage. 2.  No left MCA stenosis or major branch occlusion identified. 3.  ICA siphon and distal vertebral artery atherosclerosis, but no significant intracranial stenosis and otherwise negative intracranial CTA.     10/06/2012    PORTABLE CHEST -    IMPRESSION: Bibasilar scarring or atelectasis.       EKG  EKG reveals a paced rhythm at 70 beats per minute.   Therapy Recommendations -evaluation underway.   Physical Exam      ASSESSMENT [name] is a 71 y.o. male presenting with aphasia and right-sided weakness.. Status post IV t-PA  on 10/06/2012 at 1645.. 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 The infarct is believed secondary to  an embolus. Work up underway. Prior to admission the patient was on aspirin 81 mg daily as well as Plavix 75 mg daily. It is believed that he will require Coumadin therapy or perhaps one of the newer agents for secondary stroke prevention. His cardiologist is Dr. Nanetta Parrish and a cardiology consult has been requested in order to determine if the patient  has a history of atrial fibrillation and for assistance with recommendations for anticoagulation. The patient remains significantly aphasic with mild right sided weakness.    Hypertension Coronary artery disease Tobacco history Status post permanent pacemaker believed to be MRI compatible Hyperlipidemia Elevated liver function tests Mild anemia Early diabetes Hypokalemia   Hospital day # 1     TREATMENT/PLAN Continue aspirin and Plavix for secondary stroke prevention. Transfer out of the intensive care unit Continue therapies Cardiology consultation Risk factor modification   Delton See PA-C [facility] Pager [phone] 10/07/2012, 4:49 PM

## 2012-10-07 NOTE — Evaluation (Signed)
Speech Language Pathology Evaluation Patient Details Name: Jeremy Parrish MRN: [id number] DOB: May 17, 1941 Today's Date: 10/07/2012 Time: 4098-1191 SLP Time Calculation (min): 35 min  Problem List:  Patient Active Problem List  Diagnosis  . CANDIDIASIS, ORAL  . LYMPHOMA  . MULTIPLE  MYELOMA  . ANXIETY  . HYPERTENSION  . MYOCARDIAL INFARCTION  . CAD, dating to 1996 with multiple PCIs, Stents to RCA and LCX, Last cath 2007 patent stents,30% prox LAD lesion, EF 45%  . ASTHMA, UNSPECIFIED  . NEPHROLITHIASIS  . COUGH  . ELEVATED PROSTATE SPECIFIC ANTIGEN  . Nonspecific (abnormal) findings on radiological and other examination of body structure  . ROTATOR CUFF REPAIR, RIGHT, HX OF  . ABNORMAL LUNG XRAY  . Hypogammaglobulinemia  . Cerebral embolism with cerebral infarction  . [place]  . 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 Hx of bladder cancer   Past Medical History:  Past Medical History  Diagnosis Date  . Hypertension   . Heart disease   . Kidney stones     history  . Lung mass   . Heart murmur   . Cancer   . Prostate cancer   . Multiple myeloma(203.0)   . 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 Coronary artery disease   . Hx of bladder cancer 10/07/2012   Past Surgical History:  Past Surgical History  Procedure Date  . Prostate surgery   . Heart stents x 5   . Portacath placement   . Wrist surgery     right  . Left ear skin cancer removed   . Bone marrow transplant   . Pacemaker insertion   . Coronary angioplasty   . Insert / replace / remove pacemaker    HPI:  Pt is a 71 y.o. male admitted 10/06/12 after pt was unable to speak with a right facial droop. Patient transported by EMS and admitted as a code stroke and received TPA. Head CT shows no acute findings, however, MD suspects a dominant hemisphere ischemic event.     Assessment / Plan / Recommendation Clinical Impression  Pt presents with severe verbal apraxia, however, but is stimulable for bilabial sounds.  Pt's overall auditory and reading comprehension appeared intact for basic, functional information and commands. Pt able to utilize multimodal communication (gestures and communication board) to make basic wants/needs known. Pt and family educated about current speech diagnosis of apraxia and how to appropriately interact with pt to increase overall functional communication.     SLP Assessment  Patient needs continued Speech Lanaguage Pathology Services    Follow Up Recommendations   (TBD)    Frequency and Duration min 3x week  1 week   Pertinent Vitals/Pain No/Denies Pain   SLP Goals  SLP Goals Potential to Achieve Goals: Good Progress/Goals/Alternative treatment plan discussed with pt/caregiver and they: Agree SLP Goal #1: Pt will demonstrate vowel shaping with Max A multimodal cueing  SLP Goal #2: Pt will utilize augmentative communication to express basic wants/needs with supervision multimodal cueing   SLP Evaluation Prior Functioning  Cognitive/Linguistic Baseline: Within functional limits   Cognition  Arousal/Alertness: Awake/alert Orientation Level: Oriented X4 Attention: Selective Selective Attention: Appears intact ( selected attention with a large amount of family in room) Memory: Appears intact Awareness: Appears intact    Comprehension  Auditory Comprehension Yes/No Questions: Within Functional Limits Basic Biographical Questions: 76-100% accurate Basic Immediate Environment Questions: 75-100% accurate Commands: Within  Functional Limits One Step Basic Commands: 75-100% accurate EffectiveTechniques: Extra processing time Visual Recognition/Discrimination Discrimination: Exceptions to [facility] (wears glasses with mild right inattention ) Reading Comprehension Reading Status: Within funtional limits (at the word and  sentence level)    Expression Expression Primary Mode of Expression: Nonverbal - gestures Verbal Expression Overall Verbal Expression: Impaired Initiation: No impairment Repetition: Impaired Naming: Impairment Responsive: 0-25% accurate Confrontation: Impaired Verbal Errors: Aware of errors Pragmatics: Impairment Impairments: Eye contact (right inattention) Effective Techniques: Written cues (yes/no questions, visual aids) Non-Verbal Means of Communication: Gestures;Communication board Other Verbal Expression Comments: Pt demonstrates severe oral apraxia, however able to utilize gestures and communication board to express basic wants/needs  Written Expression Dominant Hand: Right Written Expression:  (able to write name and self-corrected errors)   Oral / Motor Oral Motor/Sensory Function Overall Oral Motor/Sensory Function: Appears within functional limits for tasks assessed Motor Speech Overall Motor Speech: Impaired Respiration: Within functional limits Phonation: Normal Resonance: Within functional limits Motor Planning: Impaired Level of Impairment: Word Motor Speech Errors: Aware   GO     Jeremy Parrish 10/07/2012, 3:58 PM

## 2012-10-07 NOTE — Progress Notes (Signed)
HISTORY This 71 year old male presented to the [facility] emergency department with right-sided weakness and a right facial droop. He was unable to speak. He was last seen normal at 1410 hours. The patient was seen in consultation by Dr. Thad Ranger and received intravenous TPA.  SUBJECTIVE The patient's wife was at the bedside today when seen by Dr. Pearlean Brownie.   His condition appears to have improved significantly; however, he continues to have significant problems with expressive aphasia. The patient has a permanent pacemaker and is followed by Dr. York Ram. The pacemaker is reportedly MRI compatible.  OBJECTIVE Most recent Vital Signs: Filed Vitals:   10/07/12 0600 10/07/12 0700 10/07/12 0800 10/07/12 0811  BP: 141/65 139/79 132/83   Pulse: 64 62 77   Temp:    97.7 F (36.5 C)  TempSrc:    Oral  Resp: 18 17 20    Height:      Weight:      SpO2: 93% 98% 94%    CBG (last 3)   Basename 10/06/12 1641  GLUCAP 135*    IV Fluid Intake:     . sodium chloride 1,000 mL (10/06/12 1900)    MEDICATIONS    . sodium chloride  250 mL Intravenous Once  . [COMPLETED] alteplase  78 mg Intravenous Once  . pantoprazole (PROTONIX) IV  40 mg Intravenous QHS  . [COMPLETED] potassium chloride  10 mEq Intravenous Q1 Hr x 2  . [COMPLETED] sodium chloride  500 mL Intravenous Once  . [DISCONTINUED] alteplase  0.9 mg/kg Intravenous Once   PRN:  acetaminophen, acetaminophen, [COMPLETED] iohexol, labetalol, ondansetron (ZOFRAN) IV, senna-docusate  Diet:  Cardiac with thin liquids Activity:  Bedrest DVT Prophylaxis:  SCD's  CLINICALLY SIGNIFICANT STUDIES Basic Metabolic Panel:  Lab 10/06/12 8295 10/06/12 1620  NA 142 137  K 3.3* 3.1*  CL 103 100  CO2 -- 28  GLUCOSE 144* 151*  BUN 13 13  CREATININE 0.90 0.82  CALCIUM -- 9.2  MG -- --  PHOS -- --   Liver Function Tests:  Lab 10/06/12 1620  AST 42*  ALT 58*  ALKPHOS 74  BILITOT 0.2*  PROT 7.9  ALBUMIN 2.8*   CBC:  Lab  10/06/12 1630 10/06/12 1620  WBC -- 4.3  NEUTROABS -- 3.3  HGB 12.9* 11.8*  HCT 38.0* 36.9*  MCV -- 89.6  PLT -- 160   Coagulation:  Lab 10/06/12 1620  LABPROT 12.7  INR 0.96   Cardiac Enzymes:  Lab 10/06/12 1619  CKTOTAL --  CKMB --  CKMBINDEX --  TROPONINI <0.30   Urinalysis: No results found for this basename: COLORURINE:2,APPERANCEUR:2,LABSPEC:2,PHURINE:2,GLUCOSEU:2,HGBUR:2,BILIRUBINUR:2,KETONESUR:2,PROTEINUR:2,UROBILINOGEN:2,NITRITE:2,LEUKOCYTESUR:2 in the last 168 hours Lipid Panel    Component Value Date/Time   CHOL 135 10/07/2012 0425   TRIG 181* 10/07/2012 0425   HDL 25* 10/07/2012 0425   CHOLHDL 5.4 10/07/2012 0425   VLDL 36 10/07/2012 0425   LDLCALC 74 10/07/2012 0425   HgbA1C  No results found for this basename: HGBA1C    Urine Drug Screen:     Component Value Date/Time   LABOPIA NONE DETECTED 10/06/2012 1816   COCAINSCRNUR NONE DETECTED 10/06/2012 1816   LABBENZ NONE DETECTED 10/06/2012 1816   AMPHETMU NONE DETECTED 10/06/2012 1816   THCU NONE DETECTED 10/06/2012 1816   LABBARB NONE DETECTED 10/06/2012 1816    Alcohol Level: No results found for this basename: ETH:2 in the last 168 hours  Ct Angio Head W/cm &/or Wo Cm  10/06/2012 CT ANGIOGRAPHY HEAD AND NECK    IMPRESSION: 1.  Atherosclerosis in the aortic arch and neck.  Proximal left vertebral artery stenosis is mild to moderate, but otherwise no significant stenosis in the neck. 2.   See intracranial findings are below. 3.  Advanced cervical spine degenerative changes.  CTA HEAD  Findings:  There does appear to be subtle loss of gray-white matter differentiation in the left frontal operculum (s  IMPRESSION:  1.  Early CT changes the left MCA infarct.  No mass effect or hemorrhage. 2.  No left MCA stenosis or major branch occlusion identified. 3.  ICA siphon and distal vertebral artery atherosclerosis, but no significant intracranial stenosis and otherwise negative intracranial CTA.  Study discussed with  Dr. Thana Farr at 1800 hours on 10/06/2012.   Original Report Authenticated By: Erskine Speed, M.D.     10/06/2012 CT HEAD WITHOUT CONTRAST   IMPRESSION: No acute findings.  I discussed this report with the patient's physician in the Emergency Department by phone at 1637.   Original Report Authenticated By: Esperanza Heir, M.D.     10/06/2012   .  CT ANGIOGRAPHY HEAD AND NECK   IMPRESSION:  1.  Early CT changes the left MCA infarct.  No mass effect or hemorrhage. 2.  No left MCA stenosis or major branch occlusion identified. 3.  ICA siphon and distal vertebral artery atherosclerosis, but no significant intracranial stenosis and otherwise negative intracranial CTA.    10/06/2012    PORTABLE CHEST -    IMPRESSION: Bibasilar scarring or atelectasis.      EKG  EKG reveals a paced rhythm at 70 beats per minute.  Therapy Recommendations -evaluation underway.  Physical Exam   Pleasant elderly Caucasian male currently not in distress.Awake alert. Afebrile. Head is nontraumatic. Neck is supple without bruit. Hearing is normal. Cardiac exam no murmur or gallop. Lungs are clear to auscultation. Distal pulses are well felt.  Neurological exam : Awake  Alert oriented x 3.Mild non-fluent speech with word finding difficulties. No paraphasias. Good comprehension. Difficulty with naming and repetition.[place] movements full without nystagmus. Face asymmetric with mild right lower facial weakness. Tongue midline. Normal strength, tone, reflexes and coordination.diminished fine finger movements on the right. Orbits left over right upper extremity. Right lower extended his strength is 4/5 proximally and ankle dorsiflexors. Normal sensation. Gait deferred.  ASSESSMENT Jeremy Parrish is a 71 y.o. male presenting with aphasia and right-sided weakness.. Status post IV t-PA  on 10/06/2012 at 1645.. 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 The infarct is believed secondary to an embolus. Work  up underway. Prior to admission the patient was on aspirin 81 mg daily as well as Plavix 75 mg daily. It is believed that he will require Coumadin therapy or perhaps one of the newer agents for secondary stroke prevention. His cardiologist is Dr. Nanetta Batty and a cardiology consult has been requested in order to determine if the patient has a history of atrial fibrillation and for assistance with recommendations for anticoagulation. The patient remains significantly aphasic with mild right sided weakness.   Hypertension  Coronary artery disease  Tobacco history  Status post permanent pacemaker believed to be MRI compatible  Hyperlipidemia  Elevated liver function tests  Mild anemia  Early diabetes  Hypokalemia  Hospital day # 1   TREATMENT/PLAN  Continue aspirin and Plavix for secondary stroke prevention.  Transfer out of the intensive care unit  Continue therapies  Cardiology consultation to confirm prior history of atrial fibrillation and need for long-term anticoagulation  Risk  factor modification  Hassel Neth [facility] Pager [phone] 10/07/2012, 4:49 PM I have personally examined this patient, reviewed pertinent data, developed plan of care and agree with above  Delia Heady, MD Medical Director [facility] Pager: [phone] 10/07/2012 5:09 PM

## 2012-10-07 NOTE — Progress Notes (Signed)
  Echocardiogram 2D Echocardiogram has been performed.  Norleen Xie 10/07/2012, 11:21 AM

## 2012-10-07 NOTE — Consult Note (Signed)
I seen and evaluated the patient this PM along with Ms. Ingold NP. I agree with her findings, examination as well as impression recommendations.  Mild ICM with multiple remote stents.  Has had relatively normal evaluations since cath in 2007.   P/w CVA - given [employer].  PPM is MRI compatible with Device rep standing by. No evidence of Afib on device interrogation, but 6 d ago did have NSVT.  PPM was for bradycardia, so baseline junctional rhythm is not unexpected.  Will follow up echo (not ready to read as yet) -- need to reassess EF with recent NSVT.   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  Has had brisk diuresis with no diuretic medication.    Will be available for ?s.   Is on Plavix @ home due to prior CAD.  Will defer +/- Anticoagulation regimen to Neurology.  Marykay Lex, M.D., M.S. [facility] [address] [place], [place]  [address]  [phone] Pager # [phone] 10/07/2012 5:10 PM

## 2012-10-07 NOTE — Progress Notes (Signed)
PT Cancellation Note  Patient Details Name: Jeremy Parrish MRN: [id number] DOB: September 18, 1941   Cancelled Treatment:    Reason Eval/Treat Not Completed: Medical issues which prohibited therapy (Pt currently on strict bedrest).  Will hold PT evaluation.  Please update activity order when appropriate.  Thanks!!   Bilan Tedesco 10/07/2012, 12:52 PM Jake Shark, PT DPT [phone]

## 2012-10-08 DIAGNOSIS — E785 Hyperlipidemia, unspecified: Secondary | ICD-10-CM | POA: Diagnosis present

## 2012-10-08 DIAGNOSIS — R7303 Prediabetes: Secondary | ICD-10-CM | POA: Diagnosis present

## 2012-10-08 DIAGNOSIS — E876 Hypokalemia: Secondary | ICD-10-CM | POA: Diagnosis present

## 2012-10-08 DIAGNOSIS — G8191 Hemiplegia, unspecified affecting right dominant side: Secondary | ICD-10-CM | POA: Diagnosis present

## 2012-10-08 DIAGNOSIS — R4701 Aphasia: Secondary | ICD-10-CM | POA: Diagnosis present

## 2012-10-08 NOTE — Progress Notes (Signed)
Stroke Team Progress Note  HISTORY Jeremy Parrish is an 71 y.o. male that was normal today 11/26/213. His wife left to do some errands. When she returned the patient was unable to speak. EMS was called. They felt he had a right facial droop and difficulty using his right side as well. He was able to walk to the stretcher. En route was felt to get his strength back on the right. Patient was brought in as a code stroke still unable to speak. He was given IV TPA and was admitted to the neuro ICU for further evaluation and treatment.  SUBJECTIVE His wife is at the bedside.  Overall he feels his condition is gradually improving. He still has not been able to say any words. He is getting up to the bathroom independently. His wife does report difficulties with pills and a cough, though the cough was present PTA from recent pneumonia.  OBJECTIVE Most recent Vital Signs: Filed Vitals:   10/07/12 1700 10/07/12 2125 10/08/12 0140 10/08/12 0528  BP: 134/79 129/60 127/66 106/60  Pulse: 55 62 65 65  Temp:  98.1 F (36.7 C) 97.7 F (36.5 C) 97.2 F (36.2 C)  TempSrc:  Oral Oral Oral  Resp: 20 20 20 20   Height:      Weight:      SpO2: 94% 98% 98% 95%   CBG (last 3)   Basename 10/06/12 1641  GLUCAP 135*    IV Fluid Intake:     . [DISCONTINUED] sodium chloride 1,000 mL (10/06/12 1900)    MEDICATIONS    . sodium chloride  250 mL Intravenous Once  . amLODipine  10 mg Oral Daily  . aspirin EC  81 mg Oral Daily  . azithromycin  250 mg Oral Daily  . citalopram  40 mg Oral Daily  . clopidogrel  75 mg Oral Daily  . gabapentin  1,200 mg Oral QHS  . gabapentin  600 mg Oral Custom  . levofloxacin  750 mg Oral Daily  . mometasone-formoterol  2 puff Inhalation BID  . pantoprazole  40 mg Oral Daily  . potassium chloride SA  20 mEq Oral BID  . predniSONE  40 mg Oral Q breakfast   Followed by  . predniSONE  20 mg Oral Q breakfast  . simvastatin  20 mg Oral QHS  . [DISCONTINUED] gabapentin   600-1,200 mg Oral QID  . [DISCONTINUED] levofloxacin  500 mg Oral Daily  . [DISCONTINUED] pantoprazole (PROTONIX) IV  40 mg Intravenous QHS  . [DISCONTINUED] predniSONE  20-60 mg Oral Daily   PRN:  ibuprofen, nitroGLYCERIN, senna-docusate, temazepam, [DISCONTINUED] acetaminophen, [DISCONTINUED] acetaminophen, [DISCONTINUED] labetalol, [DISCONTINUED] ondansetron (ZOFRAN) IV  Diet:  Cardiac thin liquids Activity:  As tolerated DVT Prophylaxis:  SCDs   CLINICALLY SIGNIFICANT STUDIES Basic Metabolic Panel:  Lab 10/06/12 1610 10/06/12 1620  NA 142 137  K 3.3* 3.1*  CL 103 100  CO2 -- 28  GLUCOSE 144* 151*  BUN 13 13  CREATININE 0.90 0.82  CALCIUM -- 9.2  MG -- --  PHOS -- --   Liver Function Tests:  Lab 10/06/12 1620  AST 42*  ALT 58*  ALKPHOS 74  BILITOT 0.2*  PROT 7.9  ALBUMIN 2.8*   CBC:  Lab 10/06/12 1630 10/06/12 1620  WBC -- 4.3  NEUTROABS -- 3.3  HGB 12.9* 11.8*  HCT 38.0* 36.9*  MCV -- 89.6  PLT -- 160   Coagulation:  Lab 10/06/12 1620  LABPROT 12.7  INR 0.96   Cardiac  Enzymes:  Lab 10/06/12 1619  CKTOTAL --  CKMB --  CKMBINDEX --  TROPONINI <0.30   Urinalysis: No results found for this basename: COLORURINE:2,APPERANCEUR:2,LABSPEC:2,PHURINE:2,GLUCOSEU:2,HGBUR:2,BILIRUBINUR:2,KETONESUR:2,PROTEINUR:2,UROBILINOGEN:2,NITRITE:2,LEUKOCYTESUR:2 in the last 168 hours Lipid Panel    Component Value Date/Time   CHOL 135 10/07/2012 0425   TRIG 181* 10/07/2012 0425   HDL 25* 10/07/2012 0425   CHOLHDL 5.4 10/07/2012 0425   VLDL 36 10/07/2012 0425   LDLCALC 74 10/07/2012 0425   HgbA1C  Lab Results  Component Value Date   HGBA1C 5.9* 10/07/2012    Urine Drug Screen:     Component Value Date/Time   LABOPIA NONE DETECTED 10/06/2012 1816   COCAINSCRNUR NONE DETECTED 10/06/2012 1816   LABBENZ NONE DETECTED 10/06/2012 1816   AMPHETMU NONE DETECTED 10/06/2012 1816   THCU NONE DETECTED 10/06/2012 1816   LABBARB NONE DETECTED 10/06/2012 1816    Alcohol  Level: No results found for this basename: ETH:2 in the last 168 hours  CT of the brain   10/07/2012   1.  Mild expansion of the cortical infarction in the territory of the left middle cerebral artery. 2.  No evidence of intracranial hemorrhage or mass effect.    10/06/2012  No acute findings.  CT Angio Neck 10/06/2012   1.  Atherosclerosis in the aortic arch and neck.  Proximal left vertebral artery stenosis is mild to moderate, but otherwise no significant stenosis in the neck. 2.     Advanced cervical spine degenerative changes.   CT Angio Head  10/06/2012  1.  Early CT changes the left MCA infarct.  No mass effect or hemorrhage. 2.  No left MCA stenosis or major branch occlusion identified. 3.  ICA siphon and distal vertebral artery atherosclerosis, but no significant intracranial stenosis and otherwise negative intracranial CTA.   MRI of the brain  pacer  MRA of the brain  pacer  2D Echocardiogram  EF 55-65% with no source of embolus. Moderate LVH  Carotid Doppler  See CTA of the neck  CXR  10/06/2012   Bibasilar scarring or atelectasis.   EKG  Paced rhythm at 70.   Therapy Recommendations pending  The patient is alert and cooperative.  Neurologic exam reveals full extraocular movements, the patient is mute. The tongue deviates to the right with protrusion. Visual fields are full.  Motor testing reveals good strength of all four extremities.  The patient has good finger-nose-finger and heel-to-shin bilaterally. Gait was not tested.  Deep tendon reflexes are symmetric and normal. Toes are down going bilaterally.   ASSESSMENT [name] is a 71 y.o. male presenting with aphasia and right hemiparesis . Status post IV t-PA 10/06/2012  at 1645. 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 Infarct felt to be embolic secondary to atrial fibrillation, however, cardiology has confirmed no history or afib and interrogated pacer does not reveal atrial fibrillation.  Work up  underway. On aspirin 81 mg orally every day and clopidogrel 75 mg orally every day prior to admission. Now on aspirin 81 mg orally every day and clopidogrel 75 mg orally every day for secondary stroke prevention. Patient with resultant right hemiparesis and severe expressive aphaisa.  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  Therapy evals, ST to assess swallow  OP ST language  TEE as OP next week  Discharge home  Annie Main, MSN, RN, ANVP-BC, ANP-BC, [facility] Pager: [phone] 10/08/2012 8:27 AM  Jeremy Parrish  I have personally examined this patient,reviewed pertinent data and developed plan of care. I agree with above

## 2012-10-08 NOTE — Progress Notes (Signed)
[facility]  DAILY PROGRESS NOTE   Subjective:  Dense expressive aphasia, otherwise feels and looks great  Objective:  Temp:  [97.2 F (36.2 C)-98.1 F (36.7 C)] 97.8 F (36.6 C) (11/28 1125) Pulse Rate:  [55-65] 59  (11/28 1125) Resp:  [18-23] 18  (11/28 1125) BP: (106-150)/(60-87) 140/87 mmHg (11/28 1125) SpO2:  [94 %-99 %] 99 % (11/28 1125) Weight change:   Intake/Output from previous day: 11/27 0701 - 11/28 0700 In: 500 [I.V.:500] Out: 1400 [Urine:1400]  Intake/Output from this shift:    Medications: Current Facility-Administered Medications  Medication Dose Route Frequency Provider Last Rate Last Dose  . amLODipine (NORVASC) tablet 10 mg  10 mg Oral Daily David L Rinehuls, PA-C   10 mg at 10/07/12 2022  . aspirin EC tablet 81 mg  81 mg Oral Daily Layne Benton, NP   81 mg at 10/08/12 1036  . azithromycin (ZITHROMAX) tablet 250 mg  250 mg Oral Daily David L Rinehuls, PA-C   250 mg at 10/08/12 1036  . citalopram (CELEXA) tablet 40 mg  40 mg Oral Daily David L Rinehuls, PA-C   40 mg at 10/08/12 1035  . clopidogrel (PLAVIX) tablet 75 mg  75 mg Oral Daily Layne Benton, NP   75 mg at 10/08/12 1036  . gabapentin (NEURONTIN) capsule 1,200 mg  1,200 mg Oral QHS Micki Riley, MD   1,200 mg at 10/07/12 2111  . gabapentin (NEURONTIN) capsule 600 mg  600 mg Oral Custom Micki Riley, MD   600 mg at 10/08/12 1038  . ibuprofen (ADVIL,MOTRIN) tablet 200 mg  200 mg Oral Q4H PRN David L Rinehuls, PA-C      . levofloxacin (LEVAQUIN) tablet 750 mg  750 mg Oral Daily David L Rinehuls, PA-C   750 mg at 10/08/12 1035  . mometasone-formoterol (DULERA) 100-5 MCG/ACT inhaler 2 puff  2 puff Inhalation BID David L Rinehuls, PA-C      . nitroGLYCERIN (NITROSTAT) SL tablet 0.4 mg  0.4 mg Sublingual Q5 min PRN David L Rinehuls, PA-C      . pantoprazole (PROTONIX) EC tablet 40 mg  40 mg Oral Daily David L Rinehuls, PA-C   40 mg at 10/08/12 1039  . potassium chloride SA  (K-DUR,KLOR-CON) CR tablet 20 mEq  20 mEq Oral BID David L Rinehuls, PA-C   20 mEq at 10/08/12 1035  . predniSONE (DELTASONE) tablet 40 mg  40 mg Oral Q breakfast Micki Riley, MD   40 mg at 10/07/12 2022   Followed by  . predniSONE (DELTASONE) tablet 20 mg  20 mg Oral Q breakfast Micki Riley, MD      . senna-docusate (Senokot-S) tablet 1 tablet  1 tablet Oral QHS PRN Thana Farr, MD      . simvastatin (ZOCOR) tablet 20 mg  20 mg Oral QHS David L Rinehuls, PA-C   20 mg at 10/07/12 2111  . temazepam (RESTORIL) capsule 30 mg  30 mg Oral QHS PRN David L Rinehuls, PA-C      . [DISCONTINUED] 0.9 %  sodium chloride infusion  250 mL Intravenous Once Thana Farr, MD      . [DISCONTINUED] 0.9 %  sodium chloride infusion   Intravenous Continuous Thana Farr, MD 50 mL/hr at 10/06/12 1900 1,000 mL at 10/06/12 1900  . [DISCONTINUED] acetaminophen (TYLENOL) suppository 650 mg  650 mg Rectal Q4H PRN Thana Farr, MD      . [DISCONTINUED] acetaminophen (TYLENOL) tablet 650  mg  650 mg Oral Q4H PRN Thana Farr, MD      . [DISCONTINUED] gabapentin (NEURONTIN) tablet 600-1,200 mg  600-1,200 mg Oral QID David L Rinehuls, PA-C      . [DISCONTINUED] labetalol (NORMODYNE,TRANDATE) injection 10 mg  10 mg Intravenous Q10 min PRN Thana Farr, MD      . [DISCONTINUED] levofloxacin [facility]) tablet 500 mg  500 mg Oral Daily David L Rinehuls, PA-C      . [DISCONTINUED] ondansetron (ZOFRAN) injection 4 mg  4 mg Intravenous Q6H PRN Thana Farr, MD      . [DISCONTINUED] pantoprazole (PROTONIX) injection 40 mg  40 mg Intravenous QHS Thana Farr, MD   40 mg at 10/06/12 2222  . [DISCONTINUED] predniSONE (DELTASONE) tablet 20-60 mg  20-60 mg Oral Daily Kinnie Scales Rinehuls, PA-C        Physical Exam: General appearance: alert, cooperative and no distress Neck: no adenopathy, no carotid bruit, no JVD, supple, symmetrical, trachea midline and thyroid not enlarged, symmetric, no  tenderness/mass/nodules Lungs: clear to auscultation bilaterally Heart: regular rate and rhythm, S1, S2 normal, no murmur, click, rub or gallop Abdomen: soft, non-tender; bowel sounds normal; no masses,  no organomegaly Extremities: extremities normal, atraumatic, no cyanosis or edema Pulses: 2+ and symmetric Skin: Skin color, texture, turgor normal. No rashes or lesions Neurologic: Grossly normal except aphasia, hemiparesis seems to have resolved  Lab Results: Results for orders placed during the hospital encounter of 10/06/12 (from the past 48 hour(s))  TROPONIN I     Status: Normal   Collection Time   10/06/12  4:19 PM      Component Value Range Comment   Troponin I <0.30  <0.30 ng/mL   APTT     Status: Normal   Collection Time   10/06/12  4:20 PM      Component Value Range Comment   aPTT 24  24 - 37 seconds   CBC     Status: Abnormal   Collection Time   10/06/12  4:20 PM      Component Value Range Comment   WBC 4.3  4.0 - 10.5 K/uL    RBC 4.12 (*) 4.22 - 5.81 MIL/uL    Hemoglobin 11.8 (*) 13.0 - 17.0 g/dL    HCT 40.9 (*) 81.1 - 52.0 %    MCV 89.6  78.0 - 100.0 fL    MCH 28.6  26.0 - 34.0 pg    MCHC 32.0  30.0 - 36.0 g/dL    RDW 91.4  78.2 - 95.6 %    Platelets 160  150 - 400 K/uL   DIFFERENTIAL     Status: Normal   Collection Time   10/06/12  4:20 PM      Component Value Range Comment   Neutrophils Relative 76  43 - 77 %    Neutro Abs 3.3  1.7 - 7.7 K/uL    Lymphocytes Relative 20  12 - 46 %    Lymphs Abs 0.9  0.7 - 4.0 K/uL    Monocytes Relative 4  3 - 12 %    Monocytes Absolute 0.2  0.1 - 1.0 K/uL    Eosinophils Relative 0  0 - 5 %    Eosinophils Absolute 0.0  0.0 - 0.7 K/uL    Basophils Relative 0  0 - 1 %    Basophils Absolute 0.0  0.0 - 0.1 K/uL   COMPREHENSIVE METABOLIC PANEL     Status: Abnormal   Collection Time   10/06/12  4:20 PM      Component Value Range Comment   Sodium 137  135 - 145 mEq/L    Potassium 3.1 (*) 3.5 - 5.1 mEq/L    Chloride 100  96  - 112 mEq/L    CO2 28  19 - 32 mEq/L    Glucose, Bld 151 (*) 70 - 99 mg/dL    BUN 13  6 - 23 mg/dL    Creatinine, Ser 5.78  0.50 - 1.35 mg/dL    Calcium 9.2  8.4 - 46.9 mg/dL    Total Protein 7.9  6.0 - 8.3 g/dL    Albumin 2.8 (*) 3.5 - 5.2 g/dL    AST 42 (*) 0 - 37 U/L    ALT 58 (*) 0 - 53 U/L    Alkaline Phosphatase 74  39 - 117 U/L    Total Bilirubin 0.2 (*) 0.3 - 1.2 mg/dL    GFR calc non Af Amer 88 (*) >90 mL/min    GFR calc Af Amer >90  >90 mL/min   PROTIME-INR     Status: Normal   Collection Time   10/06/12  4:20 PM      Component Value Range Comment   Prothrombin Time 12.7  11.6 - 15.2 seconds    INR 0.96  0.00 - 1.49   POCT I-STAT, CHEM 8     Status: Abnormal   Collection Time   10/06/12  4:30 PM      Component Value Range Comment   Sodium 142  135 - 145 mEq/L    Potassium 3.3 (*) 3.5 - 5.1 mEq/L    Chloride 103  96 - 112 mEq/L    BUN 13  6 - 23 mg/dL    Creatinine, Ser 6.29  0.50 - 1.35 mg/dL    Glucose, Bld 528 (*) 70 - 99 mg/dL    Calcium, Ion 4.13  2.44 - 1.30 mmol/L    TCO2 25  0 - 100 mmol/L    Hemoglobin 12.9 (*) 13.0 - 17.0 g/dL    HCT 01.0 (*) 27.2 - 52.0 %   POCT I-STAT TROPONIN I     Status: Normal   Collection Time   10/06/12  4:33 PM      Component Value Range Comment   Troponin i, poc 0.01  0.00 - 0.08 ng/mL    Comment 3            GLUCOSE, CAPILLARY     Status: Abnormal   Collection Time   10/06/12  4:41 PM      Component Value Range Comment   Glucose-Capillary 135 (*) 70 - 99 mg/dL   URINE RAPID DRUG SCREEN (HOSP PERFORMED)     Status: Normal   Collection Time   10/06/12  6:16 PM      Component Value Range Comment   Opiates NONE DETECTED  NONE DETECTED    Cocaine NONE DETECTED  NONE DETECTED    Benzodiazepines NONE DETECTED  NONE DETECTED    Amphetamines NONE DETECTED  NONE DETECTED    Tetrahydrocannabinol NONE DETECTED  NONE DETECTED    Barbiturates NONE DETECTED  NONE DETECTED   MRSA PCR SCREENING     Status: Normal   Collection Time    10/06/12  6:42 PM      Component Value Range Comment   MRSA by PCR NEGATIVE  NEGATIVE   HEMOGLOBIN A1C     Status: Abnormal   Collection Time   10/07/12  4:25 AM  Component Value Range Comment   Hemoglobin A1C 5.9 (*) <5.7 %    Mean Plasma Glucose 123 (*) <117 mg/dL   LIPID PANEL     Status: Abnormal   Collection Time   10/07/12  4:25 AM      Component Value Range Comment   Cholesterol 135  0 - 200 mg/dL    Triglycerides 161 (*) <150 mg/dL    HDL 25 (*) >09 mg/dL    Total CHOL/HDL Ratio 5.4      VLDL 36  0 - 40 mg/dL    LDL Cholesterol 74  0 - 99 mg/dL     Imaging: Ct Angio Head W/cm &/or Wo Cm  10/06/2012  *RADIOLOGY REPORT*  Clinical Data:  71 year old male with right facial droop.  Last seen normal at 1410 hours.  History of lymphoma. Status post IV t- PA.  CT ANGIOGRAPHY HEAD AND NECK  Technique:  Multidetector CT imaging of the head and neck was performed using the standard protocol during bolus administration of intravenous contrast.  Multiplanar CT image reconstructions including MIPs were obtained to evaluate the vascular anatomy. Carotid stenosis measurements (when applicable) are obtained utilizing NASCET criteria, using the distal internal carotid diameter as the denominator.  Contrast: 50mL OMNIPAQUE IOHEXOL 350 MG/ML SOLN  Comparison:  Head CT without contrast 1619 hours same day.  CTA NECK  Findings:  Left chest cardiac pacemaker.  Dependent pulmonary atelectasis.  Mediastinal lipomatosis.  No superior mediastinal lymphadenopathy.  Sub centimeter left thyroid lobe nodule of doubtful significance.  Right chest Port-A-Cath.  Larynx, pharynx, parapharyngeal spaces, retropharyngeal space, sublingual space, submandibular glands, parotid glands, and orbit soft tissues are within normal limits.  Sphenoid sinus mucoperiosteal thickening mild bubbly opacity.  Other Visualized paranasal sinuses and mastoids are clear.  No cervical lymphadenopathy.  Multilevel degenerative changes  in the cervical spine. No acute osseous abnormality identified.  Vascular Findings: Three-vessel arch configuration.  No brachial cephalic artery origin stenosis.  Tortuous but otherwise normal right common carotid artery origin. Soft and calcified plaque at the right carotid bifurcation with no hemodynamically significant stenosis.  Otherwise normal cervical right ICA.  Calcified plaque at the right subclavian artery origin resulting in less than 50% stenosis.  Tortuous right SCA also with a kinked appearance.  Right vertebral artery origin is normal.  Normal cervical right vertebral artery.  Normal left common carotid artery origin.  Calcified plaque at the left carotid bifurcation with no significant stenosis.  Tortuous cervical left ICA otherwise is within normal limits.  Calcified plaque at the left subclavian artery origin with no significant stenosis.  Calcified plaque at the left vertebral artery origin with mild stenosis.  Calcified plaque in the left V1 segment with up to moderate stenosis.  Venous contrast in the left paravertebral veins.  No other left vertebral artery plaque or stenosis in the neck.   Review of the MIP images confirms the above findings.  IMPRESSION: 1.  Atherosclerosis in the aortic arch and neck.  Proximal left vertebral artery stenosis is mild to moderate, but otherwise no significant stenosis in the neck. 2.   See intracranial findings are below. 3.  Advanced cervical spine degenerative changes.  CTA HEAD  Findings:  There does appear to be subtle loss of gray-white matter differentiation in the left frontal operculum (series 7 image 20) and extending cephalad along the left periRolandic cortex.  The vertex may be spared.  These changes are new / increased.  No mass effect or associated hemorrhage.  No  ventriculomegaly. No abnormal enhancement identified.  Vascular Findings: Major intracranial venous structures appear normally enhancing.  Mild calcified plaque in the distal left  vertebral arteries inside the skull base with no significant stenosis.  Normal left PICA. Dominant right AICA.  Patent vertebrobasilar junction.  No basilar stenosis.  SCA and PCA origins are within normal limits.  Posterior communicating arteries are diminutive or absent.  Bilateral PCA branches are within normal limits.  Right ICA siphon is patent with moderate calcified atherosclerosis. Stenosis is less than 50 % with respect to the distal vessel. Right carotid terminus is patent.  There is a diminutive right posterior communicating artery within normal origin.  Right ophthalmic artery origin is within normal limits.  Left ICA siphon is patent with moderate calcified plaque. Subsequent stenosis less than 50 % with respect to the distal vessel.  Left ophthalmic and diminutive left posterior communicating artery origins are normal.  Patent carotid termini.  MCA and ACA origins are within normal limits.  Anterior communicating artery within normal limits. Tortuous proximal ACA branches.  ACA otherwise within normal limits.  Right MCA M1 segment within normal limits.  Right MCA branches within normal limits.  Left MCA M1 segment is patent and within normal limits.  Left MCA bifurcation is patent.  No left MCA branch stenosis or occlusion identified.   Review of the MIP images confirms the above findings.  IMPRESSION:  1.  Early CT changes the left MCA infarct.  No mass effect or hemorrhage. 2.  No left MCA stenosis or major branch occlusion identified. 3.  ICA siphon and distal vertebral artery atherosclerosis, but no significant intracranial stenosis and otherwise negative intracranial CTA.  Study discussed with Dr. Thana Farr at 1800 hours on 10/06/2012.   Original Report Authenticated By: Erskine Speed, M.D.    Ct Head Wo Contrast  10/07/2012  *RADIOLOGY REPORT*  Clinical Data: Stroke, post t-PA at  CT HEAD WITHOUT CONTRAST  Technique:  Contiguous axial images were obtained from the base of the skull  through the vertex without contrast.  Comparison: CT 10/06/2012  Findings: There is a subtle cortical hypodensity in the left frontal temporal lobe which has progressed  towards the vertex (images 15-20).  There is no evidence of intracranial hemorrhage. No intraventricular hemorrhage or parenchymal hemorrhage.  The ventricles are normal volume.  There is periventricular and subcortical white matter hypodensities.  IMPRESSION:  1.  Mild expansion of the cortical infarction in the territory of the left middle cerebral artery. 2.  No evidence of intracranial hemorrhage or mass effect.   Original Report Authenticated By: Genevive Bi, M.D.    Ct Head Wo Contrast  10/06/2012  *RADIOLOGY REPORT*  Clinical Data: right sided weakness and facial droop, aphasia, code stroke  CT HEAD WITHOUT CONTRAST  Technique:  Contiguous axial images were obtained from the base of the skull through the vertex without contrast.  Comparison: None.  Findings: There is no abnormal attenuation to suggest hemorrhage, infarct, or mass.  There is no hydrocephalus.  The calvarium is intact.     There is moderate diffuse atrophy, primarily in the frontal regions bilaterally.  IMPRESSION: No acute findings.  I discussed this report with the patient's physician in the Emergency Department by phone at 1637.   Original Report Authenticated By: Esperanza Heir, M.D.    Ct Angio Neck W/cm &/or Wo/cm  10/06/2012  *RADIOLOGY REPORT*  Clinical Data:  71 year old male with right facial droop.  Last seen normal at 1410 hours.  History  of lymphoma. Status post IV t- PA.  CT ANGIOGRAPHY HEAD AND NECK  Technique:  Multidetector CT imaging of the head and neck was performed using the standard protocol during bolus administration of intravenous contrast.  Multiplanar CT image reconstructions including MIPs were obtained to evaluate the vascular anatomy. Carotid stenosis measurements (when applicable) are obtained utilizing NASCET criteria, using the  distal internal carotid diameter as the denominator.  Contrast: 50mL OMNIPAQUE IOHEXOL 350 MG/ML SOLN  Comparison:  Head CT without contrast 1619 hours same day.  CTA NECK  Findings:  Left chest cardiac pacemaker.  Dependent pulmonary atelectasis.  Mediastinal lipomatosis.  No superior mediastinal lymphadenopathy.  Sub centimeter left thyroid lobe nodule of doubtful significance.  Right chest Port-A-Cath.  Larynx, pharynx, parapharyngeal spaces, retropharyngeal space, sublingual space, submandibular glands, parotid glands, and orbit soft tissues are within normal limits.  Sphenoid sinus mucoperiosteal thickening mild bubbly opacity.  Other Visualized paranasal sinuses and mastoids are clear.  No cervical lymphadenopathy.  Multilevel degenerative changes in the cervical spine. No acute osseous abnormality identified.  Vascular Findings: Three-vessel arch configuration.  No brachial cephalic artery origin stenosis.  Tortuous but otherwise normal right common carotid artery origin. Soft and calcified plaque at the right carotid bifurcation with no hemodynamically significant stenosis.  Otherwise normal cervical right ICA.  Calcified plaque at the right subclavian artery origin resulting in less than 50% stenosis.  Tortuous right SCA also with a kinked appearance.  Right vertebral artery origin is normal.  Normal cervical right vertebral artery.  Normal left common carotid artery origin.  Calcified plaque at the left carotid bifurcation with no significant stenosis.  Tortuous cervical left ICA otherwise is within normal limits.  Calcified plaque at the left subclavian artery origin with no significant stenosis.  Calcified plaque at the left vertebral artery origin with mild stenosis.  Calcified plaque in the left V1 segment with up to moderate stenosis.  Venous contrast in the left paravertebral veins.  No other left vertebral artery plaque or stenosis in the neck.   Review of the MIP images confirms the above findings.   IMPRESSION: 1.  Atherosclerosis in the aortic arch and neck.  Proximal left vertebral artery stenosis is mild to moderate, but otherwise no significant stenosis in the neck. 2.   See intracranial findings are below. 3.  Advanced cervical spine degenerative changes.  CTA HEAD  Findings:  There does appear to be subtle loss of gray-white matter differentiation in the left frontal operculum (series 7 image 20) and extending cephalad along the left periRolandic cortex.  The vertex may be spared.  These changes are new / increased.  No mass effect or associated hemorrhage.  No ventriculomegaly. No abnormal enhancement identified.  Vascular Findings: Major intracranial venous structures appear normally enhancing.  Mild calcified plaque in the distal left vertebral arteries inside the skull base with no significant stenosis.  Normal left PICA. Dominant right AICA.  Patent vertebrobasilar junction.  No basilar stenosis.  SCA and PCA origins are within normal limits.  Posterior communicating arteries are diminutive or absent.  Bilateral PCA branches are within normal limits.  Right ICA siphon is patent with moderate calcified atherosclerosis. Stenosis is less than 50 % with respect to the distal vessel. Right carotid terminus is patent.  There is a diminutive right posterior communicating artery within normal origin.  Right ophthalmic artery origin is within normal limits.  Left ICA siphon is patent with moderate calcified plaque. Subsequent stenosis less than 50 % with respect to the  distal vessel.  Left ophthalmic and diminutive left posterior communicating artery origins are normal.  Patent carotid termini.  MCA and ACA origins are within normal limits.  Anterior communicating artery within normal limits. Tortuous proximal ACA branches.  ACA otherwise within normal limits.  Right MCA M1 segment within normal limits.  Right MCA branches within normal limits.  Left MCA M1 segment is patent and within normal limits.  Left  MCA bifurcation is patent.  No left MCA branch stenosis or occlusion identified.   Review of the MIP images confirms the above findings.  IMPRESSION:  1.  Early CT changes the left MCA infarct.  No mass effect or hemorrhage. 2.  No left MCA stenosis or major branch occlusion identified. 3.  ICA siphon and distal vertebral artery atherosclerosis, but no significant intracranial stenosis and otherwise negative intracranial CTA.  Study discussed with Dr. Thana Farr at 1800 hours on 10/06/2012.   Original Report Authenticated By: Erskine Speed, M.D.    Dg Chest Port 1 View  10/06/2012  *RADIOLOGY REPORT*  Clinical Data: Stroke.  Cough.  PORTABLE CHEST - 1 VIEW  Comparison: 10/18/2011  Findings: Right Port-A-Cath and left pacer remain in place, unchanged.  Studies AP lordotic in positioning, which accentuates heart size and basilar markings.  Suspect basilar scarring or atelectasis.  No confluent opacities otherwise.  No effusions or acute bony abnormality.  IMPRESSION: Bibasilar scarring or atelectasis.   Original Report Authenticated By: Charlett Nose, M.D.     Assessment:  1. Principal Problem: 2.  *Cerebral embolism with cerebral infarction 3. Active Problems: 4.  LYMPHOMA 5.  MULTIPLE  MYELOMA 6.  HYPERTENSION 7.  CAD, dating to 1996 with multiple PCIs, Stents to RCA and LCX, Last cath 2007 patent stents,30% prox LAD lesion, EF 45% 8.  [place] 9.  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  Hx of bladder cancer 11.  Hyperlipidemia LDL goal < 100 12.  Prediabetes 13.  Hypokalemia 14.  Expressive aphasia 15.  Hemiplegia affecting right dominant side 16.   Plan:  1. Scheduling for TEE on Tuesday December 3.  Already on statin, ASA and plavix. Also on Revlimid, with increased thrombotic risk.  Time Spent Directly with Patient:  30 minutes  Length of Stay:  LOS: 2 days    Jeremy Parrish 10/08/2012, 12:41 PM

## 2012-10-08 NOTE — Progress Notes (Signed)
Gave all discharge education and discussed about the next appointments. Requested the patient (wife) to call doctors office for the prescription to set up out patient SLP,  Our case manager requested prescription to se up outpatient SLP, paged the doctor but couldn't get in touch with him. No other concerns.

## 2012-10-08 NOTE — Discharge Summary (Signed)
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  Past Medical History  Diagnosis Date  . Hypertension   . Heart disease   . Kidney stones     history  . Lung mass   . Heart murmur   . Cancer   . Prostate cancer   . Multiple myeloma(203.0)   . 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 Coronary artery disease   . Hx of bladder cancer 10/07/2012   Past Surgical History  Procedure Date  . Prostate surgery   . Heart stents x 5   . Portacath placement   . Wrist surgery     right  . Left ear skin cancer removed   . Bone marrow transplant   . Pacemaker insertion   . Coronary angioplasty   . Insert / replace / remove pacemaker       Medication List     As of 10/08/2012 11:03 AM    STOP taking these medications         levofloxacin 500 MG tablet   Commonly known as: LEVAQUIN      TAKE these medications         amLODipine 10 MG tablet   Commonly known as: NORVASC   Take 10 mg by mouth daily.      aspirin 81 MG tablet   Take 81 mg by mouth daily.      azithromycin 250 MG tablet   Commonly known as: ZITHROMAX   Take 250 mg by mouth daily.      citalopram 40 MG tablet   Commonly known as: CELEXA   Take 40 mg by mouth daily.      clopidogrel 75 MG tablet   Commonly known as: PLAVIX   Take 75 mg by mouth daily.      Fluticasone-Salmeterol 250-50 MCG/DOSE Aepb   Commonly known as: ADVAIR   Inhale 1 puff into the lungs as needed.      gabapentin 600 MG tablet   Commonly known as: NEURONTIN   Take 600-1,200 mg by mouth 4 (four) times daily. Takes 1 tablet three times a day and 2 tablets at bedtime      ibuprofen 200 MG tablet   Commonly known as: ADVIL,MOTRIN   Take 200 mg by mouth every 4 (four) hours as needed.      lenalidomide 10 MG  capsule   Commonly known as: REVLIMID   Take 10 mg by mouth daily. Take 1 capsule by mouth daily for 7 days on followed by 7 days off      moxifloxacin 400 MG tablet   Commonly known as: AVELOX   Take 400 mg by mouth daily.      nitroGLYCERIN 0.4 MG SL tablet   Commonly known as: NITROSTAT   Place 0.4 mg under the tongue every 5 (five) minutes as needed. For chest pain      omeprazole 40 MG capsule   Commonly known as: PRILOSEC   Take 40 mg by mouth daily.      oxyCODONE-acetaminophen 5-325 MG per tablet   Commonly known as: PERCOCET/ROXICET   Take 1 tablet by mouth every 4 (four) hours as needed. For pain      potassium chloride SA 20 MEQ tablet   Commonly known as: K-DUR,KLOR-CON   Take 20 mEq by mouth 2 (two) times daily.      predniSONE 20 MG tablet   Commonly known as: DELTASONE   Take 20-60 mg by mouth daily. Take 3 tabs daily for 3 days, 2 tabs daily for 3 days, then 1 tab for 2 days.      simvastatin 20 MG tablet   Commonly known as: ZOCOR   Take 20 mg by mouth  at bedtime.      temazepam 30 MG capsule   Commonly known as: RESTORIL   Take 1 capsule (30 mg total) by mouth at bedtime as needed for sleep.      Zoledronic Acid 4 MG/100ML IVPB   Commonly known as: ZOMETA   Inject 4 mg into the vein every 30 (thirty) days.      zolpidem 5 MG tablet   Commonly known as: AMBIEN   Take 5 mg by mouth at bedtime as needed. For sleep         LABORATORY STUDIES CBC    Component Value Date/Time   WBC 4.3 10/06/2012 1620   RBC 4.12* 10/06/2012 1620   HGB 12.9* 10/06/2012 1630   HCT 38.0* 10/06/2012 1630   PLT 160 10/06/2012 1620   MCV 89.6 10/06/2012 1620   MCH 28.6 10/06/2012 1620   MCHC 32.0 10/06/2012 1620   RDW 15.3 10/06/2012 1620   LYMPHSABS 0.9 10/06/2012 1620   MONOABS 0.2 10/06/2012 1620   EOSABS 0.0 10/06/2012 1620   BASOSABS 0.0 10/06/2012 1620   CMP    Component Value Date/Time   NA 142 10/06/2012 1630   K 3.3* 10/06/2012 1630   CL 103 10/06/2012 1630   CO2 28 10/06/2012 1620   GLUCOSE 144* 10/06/2012 1630   BUN 13 10/06/2012 1630   CREATININE 0.90 10/06/2012 1630   CALCIUM 9.2 10/06/2012 1620   PROT 7.9 10/06/2012 1620   ALBUMIN 2.8* 10/06/2012 1620   AST 42* 10/06/2012 1620   ALT 58* 10/06/2012 1620   ALKPHOS 74 10/06/2012 1620   BILITOT 0.2* 10/06/2012 1620   GFRNONAA 88* 10/06/2012 1620   GFRAA >90 10/06/2012 1620   COAGS Lab Results  Component Value Date   INR 0.96 10/06/2012   INR 1.1 08/02/2009   INR 1.1 09/28/2008   Lipid Panel    Component Value Date/Time   CHOL 135 10/07/2012 0425   TRIG 181* 10/07/2012 0425   HDL 25* 10/07/2012 0425   CHOLHDL 5.4 10/07/2012 0425   VLDL 36 10/07/2012 0425   LDLCALC 74 10/07/2012 0425   HgbA1C  Lab Results  Component  Value Date   HGBA1C 5.9* 10/07/2012   Cardiac Panel (last 3 results)   Basename 10/06/12 1619  CKTOTAL --  CKMB --  TROPONINI <0.30  RELINDX --   Urinalysis No results found for this basename: colorurine,  appearanceur,  labspec,   phurine,  glucoseu,  hgbur,  bilirubinur,  ketonesur,  proteinur,  urobilinogen,  nitrite,  leukocytesur   Urine Drug Screen    Component Value Date/Time   LABOPIA NONE DETECTED 10/06/2012 1816   COCAINSCRNUR NONE DETECTED 10/06/2012 1816   LABBENZ NONE DETECTED 10/06/2012 1816   AMPHETMU NONE DETECTED 10/06/2012 1816   THCU NONE DETECTED 10/06/2012 1816   LABBARB NONE DETECTED 10/06/2012 1816    Alcohol Level No results found for this basename: eth    SIGNIFICANT DIAGNOSTIC STUDIES CT of the brain  10/07/2012 1. Mild expansion of the cortical infarction in the territory of the left middle cerebral artery. 2. No evidence of intracranial hemorrhage or mass effect.  10/06/2012 No acute findings.  CT Angio Neck 10/06/2012 1. Atherosclerosis in the aortic arch and neck. Proximal left vertebral artery stenosis is mild to moderate, but otherwise no significant stenosis in the neck. 2. Advanced cervical spine degenerative changes.  CT Angio Head 10/06/2012 1. Early CT changes the left MCA infarct. No mass effect or hemorrhage. 2. No left MCA stenosis or major branch occlusion identified. 3. ICA siphon and distal vertebral artery atherosclerosis, but no significant intracranial stenosis and otherwise negative intracranial CTA.  MRI of the brain pacer  (though it is MRI compatible)  MRA of the brain see CTA head 2D Echocardiogram EF 55-65% with no source of embolus. Moderate LVH  Carotid Doppler See CTA of the neck  CXR 10/06/2012 Bibasilar scarring or atelectasis.  EKG Paced rhythm at 70.     History of Present Illness Jeremy Parrish is an 71 y.o. male that was normal today 11/26/213. His wife left to do some errands. When she returned the patient was unable to speak. EMS was called. They felt he had a right facial droop and difficulty using his right side as well. He was able to walk to the stretcher. En route was felt to get his strength back on the right. Patient was brought in as a code  stroke still unable to speak. He was given IV TPA and was admitted to the neuro ICU for further evaluation and treatment.  Hospital Course Patient tolerated tPA without complication. Imaging at 24 hours shows no hemorrhage. MRI confirmed ischemic infarct in the left MCA cortical area. Stroke was felt to be embolic secondary to an unknown source. Questionable history of atrial fibrillation was refuted per cardiology history. They also interrogated her pacemaker which did not show atrial fibrillation. Recommend TEE to further evaluate for embolic source. As today is Thanksgiving and unable to arrange, will do as an OP next week. Annie Main, NP will call Monday to make arrangements and let the family know. Also recommend periodic pacemaker interrogation to assess for afib.  He was on aspirin 81 mg orally every day and clopidogrel 75 mg orally every day prior to admission given cardiac history with remote stents. He will be continued on aspirin 81 mg orally every day and clopidogrel 75 mg orally every day for secondary stroke prevention.  Patient with vascular risk factors of Hypertension, Coronary artery disease - Mild ICM with multiple remote stents. Has had relatively normal evaluations since cath in 2007 per cardiology consult, Tobacco history > 1 yr ago, permanent pacemaker,  Hyperlipidemia, LDL 74, on statin PTA, at goal LDL < 100, and pre-diabetes.  Patient with continued stroke symptoms of severe expressive aphasia and very mild right hemiparesis. Outpatient speech therapy recommended and arranged at time of discharge.   Discharge Exam  Blood pressure 106/60, pulse 65, temperature 97.2 F (36.2 C), temperature source Oral, resp. rate 20, height 5\' 8"  (1.727 m), weight 88 kg (194 lb 0.1 oz), SpO2 95.00%. The patient is alert and cooperative. Neurologic exam reveals full extraocular movements, the patient is mute. The tongue deviates to the right with protrusion. Visual fields are full.  Motor testing  reveals good strength of all four extremities. The patient has good finger-nose-finger and heel-to-shin bilaterally. Gait was not tested.  Deep tendon reflexes are symmetric and normal. Toes are down going bilaterally.  Discharge Diet   Cardiac thin liquids  Discharge Plan    Disposition: home with wife  Aspirin 81 and Plavix 75 mg daily for secondary stroke prevention.  Ongoing risk factor control by Primary Care Physician.  Ongoing risk factor control  Follow-up cardiology for period assessment of pacer to look for atrial fibrillation  Outpatient TEE. Annie Main, NP will schedule on Monday  Follow-up LUKING,SCOTT, MD in 1 month.  Follow-up with Dr. Delia Heady in 2 months.  Greater than 30 minutes were spent preparing discharge.  Signed Annie Main, AVNP, ANP-BC,    Stroke Center Nurse Practitioner 10/08/2012, 11:03 AM

## 2012-10-13 ENCOUNTER — Ambulatory Visit (HOSPITAL_COMMUNITY)
Admission: RE | Admit: 2012-10-13 | Discharge: 2012-10-13 | Disposition: A | Discharge status: Home / Self Care | Payer: Medicare Other | Source: Ambulatory Visit | Attending: Cardiovascular Disease | Admitting: Cardiovascular Disease

## 2012-10-13 ENCOUNTER — Encounter (HOSPITAL_COMMUNITY): Payer: Self-pay | Admitting: Gastroenterology

## 2012-10-13 ENCOUNTER — Encounter (HOSPITAL_COMMUNITY)
Admission: RE | Disposition: A | Discharge status: Home / Self Care | Payer: Self-pay | Source: Ambulatory Visit | Attending: Cardiovascular Disease

## 2012-10-13 DIAGNOSIS — I6340 Cerebral infarction due to embolism of unspecified cerebral artery: Secondary | ICD-10-CM | POA: Insufficient documentation

## 2012-10-13 DIAGNOSIS — R4701 Aphasia: Secondary | ICD-10-CM | POA: Insufficient documentation

## 2012-10-13 DIAGNOSIS — I6789 Other cerebrovascular disease: Secondary | ICD-10-CM | POA: Diagnosis not present

## 2012-10-13 HISTORY — DX: Shortness of breath: R06.02

## 2012-10-13 HISTORY — DX: Cerebral infarction, unspecified: I63.9

## 2012-10-13 HISTORY — DX: Major depressive disorder, single episode, unspecified: F32.9

## 2012-10-13 HISTORY — PX: TEE WITHOUT CARDIOVERSION: SHX5443

## 2012-10-13 HISTORY — DX: Depression, unspecified: F32.A

## 2012-10-13 SURGERY — ECHOCARDIOGRAM, TRANSESOPHAGEAL
Anesthesia: Moderate Sedation

## 2012-10-13 MED ORDER — SODIUM CHLORIDE 0.9 % IV SOLN
INTRAVENOUS | Status: DC
  Administered 2012-10-13: 500 mL via INTRAVENOUS

## 2012-10-13 MED ORDER — MIDAZOLAM HCL 10 MG/2ML IJ SOLN
INTRAMUSCULAR | Status: DC | PRN
  Administered 2012-10-13: 3 mg via INTRAVENOUS

## 2012-10-13 MED ORDER — BUTAMBEN-TETRACAINE-BENZOCAINE 2-2-14 % EX AERO
INHALATION_SPRAY | CUTANEOUS | Status: DC | PRN
  Administered 2012-10-13: 2 via TOPICAL

## 2012-10-13 MED ORDER — FENTANYL CITRATE 0.05 MG/ML IJ SOLN
INTRAMUSCULAR | Status: AC
  Filled 2012-10-13: qty 2

## 2012-10-13 MED ORDER — MIDAZOLAM HCL 5 MG/ML IJ SOLN
INTRAMUSCULAR | Status: AC
  Filled 2012-10-13: qty 2

## 2012-10-13 NOTE — Progress Notes (Signed)
  Echocardiogram Echocardiogram Transesophageal has been performed.  Margreta Journey 10/13/2012, 3:26 PM

## 2012-10-13 NOTE — CV Procedure (Signed)
INDICATIONS: Embolic stroke  PROCEDURE:   Informed consent was obtained prior to the procedure. The risks, benefits and alternatives for the procedure were discussed and the patient comprehended these risks.  Risks include, but are not limited to, cough, sore throat, vomiting, nausea, somnolence, esophageal and stomach trauma or perforation, bleeding, low blood pressure, aspiration, pneumonia, infection, trauma to the teeth and death.    After a procedural time-out, the oropharynx was anesthetized with 20% benzocaine spray. The patient was given 3 mg versed for moderate sedation.   The transesophageal probe was inserted in the esophagus and stomach without difficulty and multiple views were obtained.  The patient was kept under observation until the patient left the procedure room.  The patient left the procedure room in stable condition.   Agitated microbubble saline contrast was administered.  COMPLICATIONS:    There were no immediate complications.  FINDINGS:  Normal LV size and function. Mild to moderate MR due to mild bileaflet mitral valve prolapse. Mild aortic insufficiency. Normal left atrium, without clot, atrial septal defect and with excellent appendage emptying velocity. No evidence of atrial shunt by color Doppler or saline contrast even after provocative maneuvers. A tiny amount of saline contrast arrives in the left atrium late, consistent with pulmonary recirculation. Mild atherosclerosis in the aortic arch.  RECOMMENDATIONS:     Continue antiplatelet therapy.  Time Spent Directly with the Patient:  30 minutes   Wellington Winegarden 10/13/2012, 12:18 PM

## 2012-10-13 NOTE — H&P (Signed)
  Date of Initial H&P: 10/06/2012  History reviewed, patient examined, no change in status, stable for surgery. Recent unexplained embolic stroke, here for TEE. This procedure has been fully reviewed with the patient and his family and written informed consent has been obtained (the patient is aphasic).  Thurmon Fair, MD, [facility] and Vascular Center [phone] office [phone] pager

## 2012-10-13 NOTE — Progress Notes (Signed)
   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   (If response is "NO", the following Medicare IM given date fields will be blank) Date Medicare IM given:   Date Additional Medicare IM given:    Discharge Disposition:  HOME/SELF CARE  Per UR Regulation:    If discussed at Long Length of Stay Meetings, dates discussed:    Comments:  10/13/2012 1040 NCM contacted [facility] for SLP. Faxed orders to [phone]. They will call wife to schedule outpt SLP. NCM contacted pt's wife, cell [phone] with contact info for [facility]. Explained they will call and schedule his appt for SLP. Isidoro Donning RN CCM Case Mgmt phone [phone]  10/12/2012 1645 Reviewed d/c summary and orders. No NCM needs identified. Isidoro Donning RN CCM Case Mgmt phone [phone]

## 2012-10-14 ENCOUNTER — Encounter (HOSPITAL_COMMUNITY): Payer: Self-pay | Admitting: Cardiovascular Disease

## 2012-10-15 ENCOUNTER — Ambulatory Visit (HOSPITAL_COMMUNITY)
Admission: RE | Admit: 2012-10-15 | Discharge: 2012-10-15 | Disposition: A | Discharge status: Home / Self Care | Payer: Medicare Other | Source: Ambulatory Visit | Attending: Chiropractic Medicine | Admitting: Chiropractic Medicine

## 2012-10-15 ENCOUNTER — Encounter (HOSPITAL_COMMUNITY): Payer: Medicare Other | Attending: Oncology | Admitting: Oncology

## 2012-10-15 DIAGNOSIS — R599 Enlarged lymph nodes, unspecified: Secondary | ICD-10-CM

## 2012-10-15 DIAGNOSIS — C9000 Multiple myeloma not having achieved remission: Secondary | ICD-10-CM | POA: Diagnosis not present

## 2012-10-15 DIAGNOSIS — C8589 Other specified types of non-Hodgkin lymphoma, extranodal and solid organ sites: Secondary | ICD-10-CM | POA: Insufficient documentation

## 2012-10-15 DIAGNOSIS — IMO0001 Reserved for inherently not codable concepts without codable children: Secondary | ICD-10-CM | POA: Diagnosis not present

## 2012-10-15 DIAGNOSIS — I69920 Aphasia following unspecified cerebrovascular disease: Secondary | ICD-10-CM | POA: Diagnosis not present

## 2012-10-15 NOTE — Progress Notes (Signed)
Patient is seen as a walk-in today.  He reports he was eating breakfast this AM, he left the table to go to the restroom.  When he came back, he noted a large "nodule" on the radial side of his arm just proximal to the wrist.   He is aphasic due to a stroke.    He denies any trauma that he is aware of.  He reports he had an IV in that location recently with his stroke.   With the sudden onset and possibly a compromised vein in that area, I suspect he has a hematoma.  It is not warm.  There is some ecchymotic tissue covering this area so it may be secondary to minor trauma that is unknown to him.  He is not on coumadin, but he is on ASA daily.  I put his mind at ease that this is not likely lymphoma or multiple myeloma related due to its sudden onset of just minutes.  He denies any pain.  I have offered him a visit to the ED and he declines.  I suggested he go home and ice this area.  If it enlarges or becomes painful, he is to report to the ED.  There is no tightness of the skin.  It measures 4-5 cm (proximal-distal) by 3-4 cm (anterior-posterior).  He is to call if it worsens tomorrow or report to ED.  He is accompanied by his daughter.  Shneur [place]itten[place]

## 2012-10-16 NOTE — Evaluation (Signed)
Speech Language Pathology Evaluation Patient Details  Name: Jeremy Parrish MRN: [id number] Date of Birth: 03-07-1941  Today's Date: 10/15/2012 Time: 1300-1345 SLP Time Calculation (min): 45 min  Authorization: Medicare  Authorization Time Period: 10/15/2012-11/13/2011  Authorization Visit#:   of     Past Medical History:  Past Medical History  Diagnosis Date  . Hypertension   . Heart disease   . Kidney stones     history  . Lung mass   . Heart murmur   . Cancer   . Prostate cancer   . Multiple myeloma(203.0)   . 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 Coronary artery disease   . Hx of bladder cancer 10/07/2012  . Stroke   . Depression   . Shortness of breath    Past Surgical History:  Past Surgical History  Procedure Date  . Prostate surgery   . Heart stents x 5   . Portacath placement   . Wrist surgery     right  . Left ear skin cancer removed   . Bone marrow transplant   . Pacemaker insertion   . Coronary angioplasty   . Insert / replace / remove pacemaker   . Tee without cardioversion 10/13/2012    Procedure: TRANSESOPHAGEAL ECHOCARDIOGRAM (TEE);  Surgeon: Thurmon Fair, MD;  Location: [facility] ENDOSCOPY;  Service: Cardiovascular;  Laterality: N/A;  pat/kay/echo notified   HPI:  Symptoms/Limitations Symptoms: Pt with severe expressive aphasia vs. apraxia Special Tests: Portions of WAB and other informal measures Pain Assessment Currently in Pain?: No/denies Pain Score: 0-No pain  Prior Functional Status  Cognitive/Linguistic Baseline: Within functional limits Type of Home: House Lives With: Spouse Available Help at Discharge: Family Vocation: Retired (truck Hospital doctor)  Cognition  Overall Cognitive Status: Appears within functional limits for tasks assessed Arousal/Alertness: Awake/alert Orientation Level: Oriented X4 Selective Attention: Appears intact Memory: Appears intact Awareness: Appears  intact Problem Solving: Appears intact Safety/Judgment: Appears intact [place] [employer] Scales of Cognitive Functioning: Purposeful/appropriate  Comprehension  Auditory Comprehension Overall Auditory Comprehension: Impaired Yes/No Questions: Within Functional Limits Commands: Impaired Complex Commands: 75-100% accurate Interfering Components: Other (comment);Processing speed (TBD) EffectiveTechniques: Extra processing time;Repetition Visual Recognition/Discrimination Discrimination: Within Function Limits (wearing glasses) Reading Comprehension Reading Status: Impaired Word level: Within functional limits Sentence Level: Impaired Paragraph Level: Impaired Functional Environmental (signs, name badge): Within functional limits Effective Techniques: Verbal cueing  Expression  Expression Primary Mode of Expression: Verbal (currently primarily nonverbal) Verbal Expression Overall Verbal Expression: Impaired Initiation: No impairment Repetition: Impaired Level of Impairment: Word level Naming: Impairment Responsive: 0-25% accurate Confrontation: Impaired Convergent: 0-24% accurate Divergent: 0-24% accurate Other Naming Comments: Unable to verbalize answers, but write single words when naming with 80% acc Verbal Errors: Aware of errors Pragmatics: No impairment Effective Techniques: Semantic cues;Sentence completion;Phonemic cues;Written cues;Articulatory cues;Melodic intonation Non-Verbal Means of Communication: Gestures;Writing;Drawing;Communication board Other Verbal Expression Comments: Severe verbal apraxia Written Expression Dominant Hand: Right Written Expression: Exceptions to [facility] Self Formulation Ability: Phrase  Oral/Motor  Motor Speech Overall Motor Speech: Impaired Respiration: Within functional limits Phonation: Normal Resonance: Within functional limits Articulation: Impaired Level of Impairment: Word Intelligibility: Intelligibility reduced Word: 0-24%  accurate Motor Planning: Impaired Level of Impairment: Word Motor Speech Errors: Aware;Groping for words  SLP Goals  Home Exercise SLP Goal: Patient will Perform Home Exercise Program: with supervision, verbal cues required/provided SLP Short Term Goals SLP Short Term Goal 1: Pt will name common objects/pictures with 85% acc via written response and min  cues. SLP Short Term Goal 2: Pt will verbally name common objects/pictures with approximation of word when provided a model 70% of the time and max cues. SLP Short Term Goal 3: Pt will complete verbal single word sentence completion tasks (high frequency/automatic) with 80% acc and mod cues. SLP Short Term Goal 4: Pt will complete reading comprehension tasks (1-2 sentence level) with 80% acc and mod assist via multiple choice response. SLP Short Term Goal 5: Pt will be able to write basic personal/bio information with 100% acc and min cues. Additional SLP Short Term Goals?: Yes SLP Short Term Goal 6: Pt will complete repetitive apraxia drills (phoneme level) with 80% acc and mod/max assist. SLP Long Term Goals SLP Long Term Goal 1: Pt will increase verbal expression skills to [facility] for short phrases with use of compensatory strategies as needed. SLP Long Term Goal 2: Pt will increase reading comprehension skills to [facility] for short paragraph length material with use of strategies.  Assessment/Plan  Patient Active Problem List  Diagnosis  . CANDIDIASIS, ORAL  . LYMPHOMA  . MULTIPLE  MYELOMA  . ANXIETY  . HYPERTENSION  . MYOCARDIAL INFARCTION  . CAD, dating to 1996 with multiple PCIs, Stents to RCA and LCX, Last cath 2007 patent stents,30% prox LAD lesion, EF 45%  . ASTHMA, UNSPECIFIED  . NEPHROLITHIASIS  . COUGH  . ELEVATED PROSTATE SPECIFIC ANTIGEN  . Nonspecific (abnormal) findings on radiological and other examination of body structure  . ROTATOR CUFF REPAIR, RIGHT, HX OF  . ABNORMAL LUNG XRAY  . Hypogammaglobulinemia  . Cerebral  embolism with cerebral infarction  . Mute  . 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 Hx of bladder cancer  . Hyperlipidemia LDL goal < 100  . Prediabetes  . Hypokalemia  . Expressive aphasia  . 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 Expressive aphasia also evident due to difficulties with written tasks. Overall auditory comprehension appears intact for conversation and two -step commands. Reading comprehension is impaired at the sentence level. Family reports that pt has made a lot of progress since day of stroke, however he is primarily communicating via gesture at this time. A few real words were elicited today during the evaluation during automatic sentence completion tasks, but this is the first time family has heard real words spoken. Intensive speech/language therapy is strongly recommended to maximize recovery and increase independence with communication with family/community. Pt has excellent family support and appears motivated. Speech Therapy Frequency: min 3x week Duration: 4 weeks Treatment/Interventions: Patient/family education;SLP instruction and feedback;Compensatory strategies;Oral motor exercises;Multimodal communcation approach;Language facilitation Potential to Achieve Goals: Good Potential Considerations: Severity of impairments  GN Functional Assessment Tool Used: WAB Functional Limitations: Spoken language expressive Spoken Language Expression Current Status [phone]): At least 80 percent but less than 100 percent impaired, limited or restricted Spoken Language Expression Goal Status [phone]): At  least 20 percent but less than 40 percent impaired, limited or restricted  Thank you,  Havery Moros, CCC-SLP [phone]  PORTER,DABNEY 10/15/2012, 12:59 PM  Physician Documentation Your signature is required to indicate approval of the treatment plan as stated above.  Please sign and either send electronically or make a copy of this report for your files  and return this physician signed original.  Please mark one 1.__approve of plan  2. ___approve of plan with the following conditions.  Delia Heady ______________________________                                                          _____3/19/2014________________ Physician Signature                                                                                                             Date

## 2012-10-19 ENCOUNTER — Ambulatory Visit (HOSPITAL_COMMUNITY)
Admission: RE | Admit: 2012-10-19 | Discharge: 2012-10-19 | Disposition: A | Discharge status: Home / Self Care | Payer: Medicare Other | Source: Ambulatory Visit | Attending: Chiropractic Medicine | Admitting: Chiropractic Medicine

## 2012-10-19 NOTE — Progress Notes (Signed)
Speech Language Pathology Treatment Patient Details  Name: JAMEL HOLZMANN MRN: [id number] Date of Birth: 1941-05-05  Today's Date: 10/19/2012 Time: 4098-1191 SLP Time Calculation (min): 50 min  Authorization: Medicare  Authorization Time Period: 10/15/2012-11/13/2011  Authorization Visit#:  2 of 12    HPI:  Symptoms/Limitations Symptoms: "How are you?" -Mr. Pendergrass Pain Assessment Currently in Pain?: No/denies Pain Score: 0-No pain  Assessments Cognition [employer] Scales of Cognitive Functioning: Purposeful/appropriate  Treatment  Aphasia Therapy Oral Motor Exercises Patient/Family Education Home Exercise Program  SLP Goals  Home Exercise SLP Goal: Patient will Perform Home Exercise Program: with supervision, verbal cues required/provided SLP Goal: Perform Home Exercise Program - Progress: Progressing toward goal SLP Short Term Goals SLP Short Term Goal 1: Pt will name common objects/pictures with 85% acc via written response and min cues. SLP Short Term Goal 1 - Progress: Progressing toward goal SLP Short Term Goal 2: Pt will verbally name common objects/pictures with approximation of word when provided a model 70% of the time and max cues. SLP Short Term Goal 2 - Progress: Progressing toward goal SLP Short Term Goal 3: Pt will complete verbal single word sentence completion tasks (high frequency/automatic) with 80% acc and mod cues. SLP Short Term Goal 3 - Progress: Progressing toward goal SLP Short Term Goal 4: Pt will complete reading comprehension tasks (1-2 sentence level) with 80% acc and mod assist via multiple choice response. SLP Short Term Goal 4 - Progress: Progressing toward goal SLP Short Term Goal 5: Pt will be able to write basic personal/bio information with 100% acc and min cues. SLP Short Term Goal 5 - Progress: Progressing toward goal Additional SLP Short Term Goals?: Yes SLP Short Term Goal 6: Pt will complete repetitive apraxia drills (phoneme  level) with 80% acc and mod/max assist. SLP Short Term Goal 6 - Progress: Progressing toward goal SLP Long Term Goals SLP Long Term Goal 1: Pt will increase verbal expression skills to [facility] for short phrases with use of compensatory strategies as needed. SLP Long Term Goal 1 - Progress: Progressing toward goal SLP Long Term Goal 2: Pt will increase reading comprehension skills to [facility] for short paragraph length material with use of strategies. SLP Long Term Goal 2 - Progress: Progressing toward goal  Assessment/Plan  Patient Active Problem List  Diagnosis  . CANDIDIASIS, ORAL  . LYMPHOMA  . MULTIPLE  MYELOMA  . ANXIETY  . HYPERTENSION  . MYOCARDIAL INFARCTION  . CAD, dating to 1996 with multiple PCIs, Stents to RCA and LCX, Last cath 2007 patent stents,30% prox LAD lesion, EF 45%  . ASTHMA, UNSPECIFIED  . NEPHROLITHIASIS  . COUGH  . ELEVATED PROSTATE SPECIFIC ANTIGEN  . Nonspecific (abnormal) findings on radiological and other examination of body structure  . ROTATOR CUFF REPAIR, RIGHT, HX OF  . ABNORMAL LUNG XRAY  . Hypogammaglobulinemia  . Cerebral embolism with cerebral infarction  . Mute  . 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 Hx of bladder cancer  . Hyperlipidemia LDL goal < 100  . Prediabetes  . Hypokalemia  . Expressive aphasia  . Hemiplegia affecting right dominant side   SLP - End of Session Activity Tolerance: Patient tolerated treatment well General Behavior During Session: [facility] for tasks performed Cognition: [facility] for tasks performed  SLP Assessment/Plan Clinical Impression Statement: Mr. Fidalgo made tremendous progress over the week end. He completed all homework and practiced with his daughter for several hours a day.  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 During conversation, he was cued to write  responses if he could not verbalize or if the listener didn't understand. He tried doing that with moderate success. Apraxia drills were introduced and completed. He has more difficulty with "r, sh, l, z, y" sounds.  Speech Therapy Frequency: min 3x week Duration: 4 weeks Treatment/Interventions: Patient/family education;SLP instruction and feedback;Compensatory strategies;Oral motor exercises;Multimodal communcation approach;Language facilitation Potential to Achieve Goals: Good Potential Considerations: Severity of impairments    PORTER,DABNEY 10/19/2012, 2:44 PM

## 2012-10-20 ENCOUNTER — Ambulatory Visit (HOSPITAL_COMMUNITY)
Admission: RE | Admit: 2012-10-20 | Discharge: 2012-10-20 | Disposition: A | Discharge status: Home / Self Care | Payer: Medicare Other | Source: Ambulatory Visit | Attending: Chiropractic Medicine | Admitting: Chiropractic Medicine

## 2012-10-22 ENCOUNTER — Ambulatory Visit (HOSPITAL_COMMUNITY)
Admission: RE | Admit: 2012-10-22 | Discharge: 2012-10-22 | Disposition: A | Discharge status: Home / Self Care | Payer: Medicare Other | Source: Ambulatory Visit | Attending: Chiropractic Medicine | Admitting: Chiropractic Medicine

## 2012-10-22 NOTE — Progress Notes (Signed)
Speech Language Pathology Treatment Patient Details  Name: Jeremy Parrish MRN: [id number] Date of Birth: 29-Mar-1941  Today's Date: 10/22/2012 Time: 1301-1352 SLP Time Calculation (min): 51 min  Authorization: Medicare  Authorization Time Period: 10/15/2012-11/13/2011  Authorization Visit#:  3 of 12    HPI:  Symptoms/Limitations Symptoms: Doing well, accompanied by his wife. Pain Assessment Currently in Pain?: No/denies  Assessments Cognition [employer] Scales of Cognitive Functioning: Purposeful/appropriate  Treatment  Apraxia Therapy Aphasia Therapy Patient/Family Education Home Exercise Program  SLP Goals  Home Exercise SLP Goal: Patient will Perform Home Exercise Program: with supervision, verbal cues required/provided SLP Goal: Perform Home Exercise Program - Progress: Progressing toward goal SLP Short Term Goals SLP Short Term Goal 1: Pt will name common objects/pictures with 85% acc via written response and min cues. SLP Short Term Goal 1 - Progress: Met SLP Short Term Goal 2: Pt will verbally name common objects/pictures with approximation of word when provided a model 70% of the time and max cues. SLP Short Term Goal 2 - Progress: Met SLP Short Term Goal 3: Pt will complete verbal single word sentence completion tasks (high frequency/automatic) with 80% acc and mod cues. SLP Short Term Goal 3 - Progress: Met SLP Short Term Goal 4: Pt will complete reading comprehension tasks (1-2 sentence level) with 80% acc and mod assist via multiple choice response. SLP Short Term Goal 4 - Progress: Progressing toward goal SLP Short Term Goal 5: Pt will be able to write basic personal/bio information with 100% acc and min cues. SLP Short Term Goal 5 - Progress: Progressing toward goal Additional SLP Short Term Goals?: Yes SLP Short Term Goal 6: Pt will complete repetitive apraxia drills (phoneme level) with 80% acc and mod/max assist. SLP Short Term Goal 6 - Progress:  Progressing toward goal SLP Long Term Goals SLP Long Term Goal 1: Pt will increase verbal expression skills to [facility] for short phrases with use of compensatory strategies as needed. SLP Long Term Goal 1 - Progress: Progressing toward goal SLP Long Term Goal 2: Pt will increase reading comprehension skills to [facility] for short paragraph length material with use of strategies. SLP Long Term Goal 2 - Progress: Progressing toward goal  Assessment/Plan  Patient Active Problem List  Diagnosis  . CANDIDIASIS, ORAL  . LYMPHOMA  . MULTIPLE  MYELOMA  . ANXIETY  . HYPERTENSION  . MYOCARDIAL INFARCTION  . CAD, dating to 1996 with multiple PCIs, Stents to RCA and LCX, Last cath 2007 patent stents,30% prox LAD lesion, EF 45%  . ASTHMA, UNSPECIFIED  . NEPHROLITHIASIS  . COUGH  . ELEVATED PROSTATE SPECIFIC ANTIGEN  . Nonspecific (abnormal) findings on radiological and other examination of body structure  . ROTATOR CUFF REPAIR, RIGHT, HX OF  . ABNORMAL LUNG XRAY  . Hypogammaglobulinemia  . Cerebral embolism with cerebral infarction  . Mute  . 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 Hx of bladder cancer  . Hyperlipidemia LDL goal < 100  . Prediabetes  . Hypokalemia  . Expressive aphasia  . Hemiplegia affecting right dominant side   SLP - End of Session Activity Tolerance: Patient tolerated treatment well General Behavior During Session: [facility] for tasks performed Cognition: [facility] for tasks performed  SLP Assessment/Plan Clinical Impression Statement: Jeremy Parrish continues to make progress in verbal expression skills. During oral reading tasks (short sentences), he was cued to "see" one word at a time to prevent apraxic errors. He has difficulty  articulating multisyllabic words, blended sounds, and the plural form of words. He was able to come up with 10 items in a concrete category without assist (just needed extra time). He needed only  min cues to describe object by function (so that his wife could guess the word/object). His wife tends to jump in to help, but was receptive to refrain from cueing him to early.  Speech Therapy Frequency: min 3x week Duration: 4 weeks Treatment/Interventions: Patient/family education;SLP instruction and feedback;Compensatory strategies;Oral motor exercises;Multimodal communcation approach;Language facilitation Potential to Achieve Goals: Good Potential Considerations: Severity of impairments    Jeremy Parrish 10/22/2012, 2:53 PM

## 2012-10-26 ENCOUNTER — Other Ambulatory Visit (HOSPITAL_COMMUNITY): Payer: Self-pay | Admitting: Cardiovascular Disease

## 2012-10-26 ENCOUNTER — Ambulatory Visit (HOSPITAL_COMMUNITY)
Admission: RE | Admit: 2012-10-26 | Discharge: 2012-10-26 | Disposition: A | Discharge status: Home / Self Care | Payer: Medicare Other | Source: Ambulatory Visit | Attending: Chiropractic Medicine | Admitting: Chiropractic Medicine

## 2012-10-26 ENCOUNTER — Encounter (HOSPITAL_COMMUNITY): Payer: Medicare Other

## 2012-10-26 ENCOUNTER — Other Ambulatory Visit (HOSPITAL_COMMUNITY): Payer: Medicare Other

## 2012-10-26 DIAGNOSIS — C8589 Other specified types of non-Hodgkin lymphoma, extranodal and solid organ sites: Secondary | ICD-10-CM | POA: Diagnosis not present

## 2012-10-26 DIAGNOSIS — C9000 Multiple myeloma not having achieved remission: Secondary | ICD-10-CM

## 2012-10-26 DIAGNOSIS — I6529 Occlusion and stenosis of unspecified carotid artery: Secondary | ICD-10-CM

## 2012-10-26 LAB — COMPREHENSIVE METABOLIC PANEL
AST: 26 U/L (ref 0–37)
Albumin: 3.7 g/dL (ref 3.5–5.2)
Calcium: 10.1 mg/dL (ref 8.4–10.5)
Chloride: 103 mEq/L (ref 96–112)
Creatinine, Ser: 1 mg/dL (ref 0.50–1.35)
Total Bilirubin: 0.4 mg/dL (ref 0.3–1.2)

## 2012-10-26 LAB — CBC WITH DIFFERENTIAL/PLATELET
Basophils Absolute: 0.1 10*3/uL (ref 0.0–0.1)
Basophils Relative: 2 % — ABNORMAL HIGH (ref 0–1)
HCT: 39.8 % (ref 39.0–52.0)
MCHC: 33.2 g/dL (ref 30.0–36.0)
Monocytes Absolute: 0.7 10*3/uL (ref 0.1–1.0)
Neutro Abs: 1.7 10*3/uL (ref 1.7–7.7)
RDW: 14.8 % (ref 11.5–15.5)

## 2012-10-26 MED ORDER — VERAPAMIL HCL 2.5 MG/ML IV SOLN
INTRAVENOUS | Status: AC
  Filled 2012-10-26: qty 2

## 2012-10-26 NOTE — Progress Notes (Signed)
Speech Language Pathology Treatment Patient Details  Name: NEVAEH CASILLAS MRN: [id number] Date of Birth: 06/03/1941  Today's Date: 10/26/2012 Time: 4098-1191 SLP Time Calculation (min): 48 min  Authorization: Medicare  Authorization Time Period: 10/15/2012-11/13/2011  Authorization Visit#:  4 of 12    HPI:  Symptoms/Limitations Symptoms: Doing well Pain Assessment Currently in Pain?: No/denies Pain Score: 0-No pain  Assessments Cognition [employer] Scales of Cognitive Functioning: Purposeful/appropriate  Treatment  Aphasia Therapy Apraxia Therapy Patient/Family Education Home Exercise Program  SLP Goals  Home Exercise SLP Goal: Patient will Perform Home Exercise Program: with supervision, verbal cues required/provided SLP Goal: Perform Home Exercise Program - Progress: Progressing toward goal SLP Short Term Goals SLP Short Term Goal 1: Pt will name common objects/pictures with 85% acc via written response and min cues. SLP Short Term Goal 1 - Progress: Met SLP Short Term Goal 2: Pt will verbally name common objects/pictures with approximation of word when provided a model 70% of the time and max cues. SLP Short Term Goal 2 - Progress: Met SLP Short Term Goal 3: Pt will complete verbal single word sentence completion tasks (high frequency/automatic) with 80% acc and mod cues. SLP Short Term Goal 3 - Progress: Met SLP Short Term Goal 4: Pt will complete reading comprehension tasks (1-2 sentence level) with 80% acc and mod assist via multiple choice response. SLP Short Term Goal 4 - Progress: Progressing toward goal SLP Short Term Goal 5: Pt will be able to write basic personal/bio information with 100% acc and min cues. SLP Short Term Goal 5 - Progress: Progressing toward goal Additional SLP Short Term Goals?: Yes SLP Short Term Goal 6: Pt will complete repetitive apraxia drills (phoneme level) with 80% acc and mod/max assist. SLP Short Term Goal 6 - Progress:  Progressing toward goal SLP Long Term Goals SLP Long Term Goal 1: Pt will increase verbal expression skills to [facility] for short phrases with use of compensatory strategies as needed. SLP Long Term Goal 1 - Progress: Progressing toward goal SLP Long Term Goal 2: Pt will increase reading comprehension skills to [facility] for short paragraph length material with use of strategies. SLP Long Term Goal 2 - Progress: Progressing toward goal  Assessment/Plan  Patient Active Problem List  Diagnosis  . CANDIDIASIS, ORAL  . LYMPHOMA  . MULTIPLE  MYELOMA  . ANXIETY  . HYPERTENSION  . MYOCARDIAL INFARCTION  . CAD, dating to 1996 with multiple PCIs, Stents to RCA and LCX, Last cath 2007 patent stents,30% prox LAD lesion, EF 45%  . ASTHMA, UNSPECIFIED  . NEPHROLITHIASIS  . COUGH  . ELEVATED PROSTATE SPECIFIC ANTIGEN  . Nonspecific (abnormal) findings on radiological and other examination of body structure  . ROTATOR CUFF REPAIR, RIGHT, HX OF  . ABNORMAL LUNG XRAY  . Hypogammaglobulinemia  . Cerebral embolism with cerebral infarction  . Mute  . 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 Hx of bladder cancer  . Hyperlipidemia LDL goal < 100  . Prediabetes  . Hypokalemia  . Expressive aphasia  . Hemiplegia affecting right dominant side   SLP - End of Session Activity Tolerance: Patient tolerated treatment well General Behavior During Session: [facility] for tasks performed Cognition: [facility] for tasks performed  SLP Assessment/Plan Clinical Impression Statement: Mr. Yin continues to make good progress and his speech is more intelligible today (even to people in the waiting room). He completed apraxia drills with min assist. He still has the most  difficulty with "l, sh, ch, z, dg" but was more stimulable for these sounds today. Mr. Walther read 3-word sentences with ~75% acc and improved with repetition. He was given minimal pairs to practice for  homework. Speech Therapy Frequency: min 3x week Duration: 4 weeks Treatment/Interventions: Patient/family education;SLP instruction and feedback;Compensatory strategies;Oral motor exercises;Multimodal communcation approach;Language facilitation Potential to Achieve Goals: Good Potential Considerations: Severity of impairments    PORTER,DABNEY 10/26/2012, 3:20 PM

## 2012-10-27 ENCOUNTER — Encounter (HOSPITAL_BASED_OUTPATIENT_CLINIC_OR_DEPARTMENT_OTHER): Payer: Medicare Other

## 2012-10-27 ENCOUNTER — Ambulatory Visit (HOSPITAL_COMMUNITY)
Admission: RE | Admit: 2012-10-27 | Discharge: 2012-10-27 | Disposition: A | Discharge status: Home / Self Care | Payer: Medicare Other | Source: Ambulatory Visit | Attending: Chiropractic Medicine | Admitting: Chiropractic Medicine

## 2012-10-27 VITALS — BP 136/83 | HR 64 | Temp 98.0°F | Resp 16

## 2012-10-27 DIAGNOSIS — C9000 Multiple myeloma not having achieved remission: Secondary | ICD-10-CM | POA: Diagnosis not present

## 2012-10-27 DIAGNOSIS — C8589 Other specified types of non-Hodgkin lymphoma, extranodal and solid organ sites: Secondary | ICD-10-CM

## 2012-10-27 MED ORDER — ZOLEDRONIC ACID 4 MG/5ML IV CONC
4.0000 mg | Freq: Once | INTRAVENOUS | Status: AC
  Administered 2012-10-27: 4 mg via INTRAVENOUS
  Filled 2012-10-27: qty 5

## 2012-10-27 MED ORDER — HEPARIN SOD (PORK) LOCK FLUSH 100 UNIT/ML IV SOLN
INTRAVENOUS | Status: AC
  Filled 2012-10-27: qty 5

## 2012-10-27 MED ORDER — SODIUM CHLORIDE 0.9 % IV SOLN
Freq: Once | INTRAVENOUS | Status: AC
  Administered 2012-10-27: 250 mL via INTRAVENOUS

## 2012-10-27 MED ORDER — SODIUM CHLORIDE 0.9 % IJ SOLN
10.0000 mL | INTRAMUSCULAR | Status: DC | PRN
  Administered 2012-10-27: 10 mL
  Filled 2012-10-27: qty 10

## 2012-10-27 MED ORDER — HEPARIN SOD (PORK) LOCK FLUSH 100 UNIT/ML IV SOLN
500.0000 [IU] | Freq: Once | INTRAVENOUS | Status: AC | PRN
  Administered 2012-10-27: 500 [IU]
  Filled 2012-10-27: qty 5

## 2012-10-27 MED ORDER — HEPARIN SOD (PORK) LOCK FLUSH 100 UNIT/ML IV SOLN
500.0000 [IU] | Freq: Once | INTRAVENOUS | Status: AC
  Administered 2012-10-27: 500 [IU] via INTRAVENOUS
  Filled 2012-10-27: qty 5

## 2012-10-27 NOTE — Progress Notes (Signed)
Tolerated infusion well. 

## 2012-10-27 NOTE — Progress Notes (Signed)
Speech Language Pathology Treatment Patient Details  Name: DOMONIK LEVARIO MRN: [id number] Date of Birth: 12/24/1940  Today's Date: 10/27/2012 Time: 7846-9629 SLP Time Calculation (min): 47 min  Authorization: Medicare  Authorization Time Period: 10/15/2012-11/13/2011  Authorization Visit#:  5 of 12    HPI:  Symptoms/Limitations Symptoms: "Hey Girlfriend!" Pain Assessment Currently in Pain?: No/denies  Assessments Cognition [employer] Scales of Cognitive Functioning: Purposeful/appropriate  Treatment  Aphasia Therapy Apraxia Therapy Patient/Family Education Home Exercise Program  SLP Goals  Home Exercise SLP Goal: Patient will Perform Home Exercise Program: with supervision, verbal cues required/provided SLP Goal: Perform Home Exercise Program - Progress: Progressing toward goal SLP Short Term Goals SLP Short Term Goal 1: Pt will name common objects/pictures with 85% acc via written response and min cues. SLP Short Term Goal 1 - Progress: Met SLP Short Term Goal 2: Pt will verbally name common objects/pictures with approximation of word when provided a model 70% of the time and max cues. SLP Short Term Goal 2 - Progress: Met SLP Short Term Goal 3: Pt will complete verbal single word sentence completion tasks (high frequency/automatic) with 80% acc and mod cues. SLP Short Term Goal 3 - Progress: Met SLP Short Term Goal 4: Pt will complete reading comprehension tasks (1-2 sentence level) with 80% acc and mod assist via multiple choice response. SLP Short Term Goal 4 - Progress: Progressing toward goal SLP Short Term Goal 5: Pt will be able to write basic personal/bio information with 100% acc and min cues. SLP Short Term Goal 5 - Progress: Progressing toward goal Additional SLP Short Term Goals?: Yes SLP Short Term Goal 6: Pt will complete repetitive apraxia drills (phoneme level) with 80% acc and mod/max assist. SLP Short Term Goal 6 - Progress: Met SLP Long Term  Goals SLP Long Term Goal 1: Pt will increase verbal expression skills to [facility] for short phrases with use of compensatory strategies as needed. SLP Long Term Goal 1 - Progress: Progressing toward goal SLP Long Term Goal 2: Pt will increase reading comprehension skills to [facility] for short paragraph length material with use of strategies. SLP Long Term Goal 2 - Progress: Progressing toward goal  Assessment/Plan  Patient Active Problem List  Diagnosis  . CANDIDIASIS, ORAL  . LYMPHOMA  . MULTIPLE  MYELOMA  . ANXIETY  . HYPERTENSION  . MYOCARDIAL INFARCTION  . CAD, dating to 1996 with multiple PCIs, Stents to RCA and LCX, Last cath 2007 patent stents,30% prox LAD lesion, EF 45%  . ASTHMA, UNSPECIFIED  . NEPHROLITHIASIS  . COUGH  . ELEVATED PROSTATE SPECIFIC ANTIGEN  . Nonspecific (abnormal) findings on radiological and other examination of body structure  . ROTATOR CUFF REPAIR, RIGHT, HX OF  . ABNORMAL LUNG XRAY  . Hypogammaglobulinemia  . Cerebral embolism with cerebral infarction  . Mute  . 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 Hx of bladder cancer  . Hyperlipidemia LDL goal < 100  . Prediabetes  . Hypokalemia  . Expressive aphasia  . Hemiplegia affecting right dominant side   SLP - End of Session Activity Tolerance: Patient tolerated treatment well General Behavior During Session: [facility] for tasks performed Cognition: [facility] for tasks performed  SLP Assessment/Plan Clinical Impression Statement: Mr. Simkins was very talkative today and shared his work history and places he traveled for his job. I had to request clarification ~10x over 30 minutes. Many times he visibly groped for the words and attempted to self  correct. Multisyllabic words and words with blends and "z" were the most challenging. He reports that when he practiced at home by himself, he had a hard time. Wife says that when he read aloud the apraxia drill  with her, he did well. During apraxia picture drills for "l" he was 90% intelligible. Speech Therapy Frequency: min 3x week Duration: 4 weeks Treatment/Interventions: Patient/family education;SLP instruction and feedback;Compensatory strategies;Oral motor exercises;Multimodal communcation approach;Language facilitation Potential to Achieve Goals: Good Potential Considerations: Severity of impairments    PORTER,DABNEY 10/27/2012, 3:11 PM

## 2012-10-28 DIAGNOSIS — H02009 Unspecified entropion of unspecified eye, unspecified eyelid: Secondary | ICD-10-CM | POA: Diagnosis not present

## 2012-10-29 ENCOUNTER — Ambulatory Visit (HOSPITAL_COMMUNITY)
Admission: RE | Admit: 2012-10-29 | Discharge: 2012-10-29 | Disposition: A | Discharge status: Home / Self Care | Payer: Medicare Other | Source: Ambulatory Visit | Attending: Neurology | Admitting: Neurology

## 2012-10-29 NOTE — Progress Notes (Signed)
Speech Language Pathology Treatment Patient Details  Name: Jeremy Parrish MRN: [id number] Date of Birth: September 24, 1941  Today's Date: 10/29/2012 Time: 7829-5621 SLP Time Calculation (min): 50 min  Authorization: Medicare  Authorization Time Period: 10/15/2012-11/13/2011  Authorization Visit#:  6 of 12    HPI:  Symptoms/Limitations Symptoms: "I'm doing good!" Pain Assessment Currently in Pain?: No/denies  Assessments Cognition [employer] Scales of Cognitive Functioning: Purposeful/appropriate  Treatment  Aphasia Therapy Apraxia Therapy Patient/Family Education Home Exercise Program  SLP Goals  Home Exercise SLP Goal: Patient will Perform Home Exercise Program: with supervision, verbal cues required/provided SLP Goal: Perform Home Exercise Program - Progress: Progressing toward goal SLP Short Term Goals SLP Short Term Goal 1: Pt will name common objects/pictures with 85% acc via written response and min cues. SLP Short Term Goal 1 - Progress: Met SLP Short Term Goal 2: Pt will verbally name common objects/pictures with approximation of word when provided a model 70% of the time and max cues. SLP Short Term Goal 2 - Progress: Met SLP Short Term Goal 3: Pt will complete verbal single word sentence completion tasks (high frequency/automatic) with 80% acc and mod cues. SLP Short Term Goal 3 - Progress: Met SLP Short Term Goal 4: Pt will complete reading comprehension tasks (1-2 sentence level) with 80% acc and mod assist via multiple choice response. SLP Short Term Goal 4 - Progress: Progressing toward goal SLP Short Term Goal 5: Pt will be able to write basic personal/bio information with 100% acc and min cues. SLP Short Term Goal 5 - Progress: Met Additional SLP Short Term Goals?: Yes SLP Short Term Goal 6: Pt will complete repetitive apraxia drills (phoneme level) with 80% acc and mod/max assist. SLP Short Term Goal 6 - Progress: Met SLP Long Term Goals SLP Long Term  Goal 1: Pt will increase verbal expression skills to [facility] for short phrases with use of compensatory strategies as needed. SLP Long Term Goal 1 - Progress: Progressing toward goal SLP Long Term Goal 2: Pt will increase reading comprehension skills to [facility] for short paragraph length material with use of strategies. SLP Long Term Goal 2 - Progress: Progressing toward goal  Assessment/Plan  Patient Active Problem List  Diagnosis  . CANDIDIASIS, ORAL  . LYMPHOMA  . MULTIPLE  MYELOMA  . ANXIETY  . HYPERTENSION  . MYOCARDIAL INFARCTION  . CAD, dating to 1996 with multiple PCIs, Stents to RCA and LCX, Last cath 2007 patent stents,30% prox LAD lesion, EF 45%  . ASTHMA, UNSPECIFIED  . NEPHROLITHIASIS  . COUGH  . ELEVATED PROSTATE SPECIFIC ANTIGEN  . Nonspecific (abnormal) findings on radiological and other examination of body structure  . ROTATOR CUFF REPAIR, RIGHT, HX OF  . ABNORMAL LUNG XRAY  . Hypogammaglobulinemia  . Cerebral embolism with cerebral infarction  . Mute  . 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 Hx of bladder cancer  . Hyperlipidemia LDL goal < 100  . Prediabetes  . Hypokalemia  . Expressive aphasia  . Hemiplegia affecting right dominant side   SLP - End of Session Activity Tolerance: Patient tolerated treatment well General Behavior During Session: [facility] for tasks performed Cognition: [facility] for tasks performed  SLP Assessment/Plan Clinical Impression Statement: Jeremy Parrish continues to make excellent progress and talks more and more every day (with improved accuracy). He was able to write his name, address, and phone # leaving out only an "e" in his street name. He completed 3-sentence  reading comprehension task with 100% acc when given mi/mod cues for details. He finds it easier to read aloud rather than silently to himself. In conversation, intelligibility decreases when he is excited and speaking  quickly. Blends and /z/ words continue to be more challenging. He was given reading comprehension for homework. Speech Therapy Frequency: min 3x week Duration: 4 weeks Treatment/Interventions: Patient/family education;SLP instruction and feedback;Compensatory strategies;Oral motor exercises;Multimodal communcation approach;Language facilitation Potential to Achieve Goals: Good Potential Considerations: Severity of impairments    PORTER,DABNEY 10/29/2012, 11:12 AM

## 2012-10-30 DIAGNOSIS — H02009 Unspecified entropion of unspecified eye, unspecified eyelid: Secondary | ICD-10-CM | POA: Diagnosis not present

## 2012-11-02 ENCOUNTER — Ambulatory Visit (HOSPITAL_COMMUNITY)
Admission: RE | Admit: 2012-11-02 | Discharge: 2012-11-02 | Disposition: A | Discharge status: Home / Self Care | Payer: Medicare Other | Source: Ambulatory Visit | Attending: Chiropractic Medicine | Admitting: Chiropractic Medicine

## 2012-11-02 ENCOUNTER — Other Ambulatory Visit (HOSPITAL_COMMUNITY): Payer: Self-pay | Admitting: Oncology

## 2012-11-02 ENCOUNTER — Telehealth (HOSPITAL_COMMUNITY): Payer: Self-pay | Admitting: Oncology

## 2012-11-02 ENCOUNTER — Other Ambulatory Visit (HOSPITAL_COMMUNITY): Payer: Self-pay

## 2012-11-02 NOTE — Progress Notes (Signed)
Speech Language Pathology Treatment Patient Details  Name: JIYAN WALKOWSKI MRN: [id number] Date of Birth: 1941-05-02  Today's Date: 11/02/2012 Time: 1301-1400 SLP Time Calculation (min): 59 min  Authorization: Medicare  Authorization Time Period: 10/15/2012-11/13/2011  Authorization Visit#:  7 of 12    HPI:  Symptoms/Limitations Symptoms: Doing great. He had his eyelid repair procedure last week. Pain Assessment Currently in Pain?: No/denies  Assessments Cognition [place]ant Scales of Cognitive Functioning: Purposeful/appropriate  Treatment  Aphasia Therapy Apraxia Therapy Patient/Family Education Home Exercise Program  SLP Goals  Home Exercise SLP Goal: Patient will Perform Home Exercise Program: with supervision, verbal cues required/provided SLP Goal: Perform Home Exercise Program - Progress: Progressing toward goal SLP Short Term Goals SLP Short Term Goal 1: Pt will name common objects/pictures with 85% acc via written response and min cues. SLP Short Term Goal 1 - Progress: Met SLP Short Term Goal 2: Pt will verbally name common objects/pictures with approximation of word when provided a model 70% of the time and max cues. SLP Short Term Goal 2 - Progress: Met SLP Short Term Goal 3: Pt will complete verbal single word sentence completion tasks (high frequency/automatic) with 80% acc and mod cues. SLP Short Term Goal 3 - Progress: Met SLP Short Term Goal 4: Pt will complete reading comprehension tasks (1-2 sentence level) with 80% acc and mod assist via multiple choice response. SLP Short Term Goal 4 - Progress: Progressing toward goal SLP Short Term Goal 5: Pt will be able to write basic personal/bio information with 100% acc and min cues. SLP Short Term Goal 5 - Progress: Met Additional SLP Short Term Goals?: Yes SLP Short Term Goal 6: Pt will complete repetitive apraxia drills (phoneme level) with 80% acc and mod/max assist. SLP Short Term Goal 6 - Progress:  Met SLP Long Term Goals SLP Long Term Goal 1: Pt will increase verbal expression skills to [facility] for short phrases with use of compensatory strategies as needed. SLP Long Term Goal 1 - Progress: Progressing toward goal SLP Long Term Goal 2: Pt will increase reading comprehension skills to [facility] for short paragraph length material with use of strategies. SLP Long Term Goal 2 - Progress: Progressing toward goal  Assessment/Plan  Patient Active Problem List  Diagnosis  . CANDIDIASIS, ORAL  . LYMPHOMA  . MULTIPLE  MYELOMA  . ANXIETY  . HYPERTENSION  . MYOCARDIAL INFARCTION  . CAD, dating to 1996 with multiple PCIs, Stents to RCA and LCX, Last cath 2007 patent stents,30% prox LAD lesion, EF 45%  . ASTHMA, UNSPECIFIED  . NEPHROLITHIASIS  . COUGH  . ELEVATED PROSTATE SPECIFIC ANTIGEN  . Nonspecific (abnormal) findings on radiological and other examination of body structure  . ROTATOR CUFF REPAIR, RIGHT, HX OF  . ABNORMAL LUNG XRAY  . Hypogammaglobulinemia  . Cerebral embolism with cerebral infarction  . Mute  . 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 Hx of bladder cancer  . Hyperlipidemia LDL goal < 100  . Prediabetes  . Hypokalemia  . Expressive aphasia  . Hemiplegia affecting right dominant side   SLP - End of Session Activity Tolerance: Patient tolerated treatment well General Behavior During Session: [facility] for tasks performed Cognition: [facility] for tasks performed  SLP Assessment/Plan Clinical Impression Statement: Mr. Mohamed continues to make great progress. He had a hard time completing reading comprehension homework independently and his wife needed to read the sentences aloud. He was able to complete single word  sentence completion tasks with 100 % acc with multiple choice response. Oral reading is ~88% acc for single sentences. His wife is quick to jump in to help, but given time he can usually come up with  appropriate response. He did a great job orally reading/pronouncing 3-syllable words and needed mod cues for 4-5 syllable words and words with blends. Speech Therapy Frequency: min 3x week Duration: 4 weeks Treatment/Interventions: Patient/family education;SLP instruction and feedback;Compensatory strategies;Oral motor exercises;Multimodal communcation approach;Language facilitation Potential to Achieve Goals: Good Potential Considerations: Severity of impairments    PORTER,DABNEY 11/02/2012, 11:02 PM

## 2012-11-03 ENCOUNTER — Other Ambulatory Visit (HOSPITAL_COMMUNITY): Payer: Self-pay | Admitting: Oncology

## 2012-11-03 ENCOUNTER — Telehealth (HOSPITAL_COMMUNITY): Payer: Self-pay | Admitting: *Deleted

## 2012-11-05 ENCOUNTER — Ambulatory Visit (HOSPITAL_COMMUNITY)
Admission: RE | Admit: 2012-11-05 | Discharge: 2012-11-05 | Disposition: A | Discharge status: Home / Self Care | Payer: Medicare Other | Source: Ambulatory Visit | Attending: Chiropractic Medicine | Admitting: Chiropractic Medicine

## 2012-11-05 DIAGNOSIS — I10 Essential (primary) hypertension: Secondary | ICD-10-CM | POA: Diagnosis not present

## 2012-11-05 DIAGNOSIS — J42 Unspecified chronic bronchitis: Secondary | ICD-10-CM | POA: Diagnosis not present

## 2012-11-05 DIAGNOSIS — E785 Hyperlipidemia, unspecified: Secondary | ICD-10-CM | POA: Diagnosis not present

## 2012-11-05 DIAGNOSIS — I6789 Other cerebrovascular disease: Secondary | ICD-10-CM | POA: Diagnosis not present

## 2012-11-05 NOTE — Progress Notes (Signed)
Speech Language Pathology Treatment Patient Details  Name: BARTH TRELLA MRN: [id number] Date of Birth: 25-May-1941  Today's Date: 11/05/2012 Time: 5284-1324 SLP Time Calculation (min): 60 min  Authorization: Medicare  Authorization Time Period: 10/15/2012-11/13/2011  Authorization Visit#:  8 of 12    HPI:  Symptoms/Limitations Symptoms: Doing well, accompanied by his wife. Pain Assessment Currently in Pain?: No/denies  Assessments Cognition [place]ant Scales of Cognitive Functioning: Purposeful/appropriate  Treatment  Aphasia Therapy Apraxia Therapy Patient/Family Education Home Exercise Program  SLP Goals  Home Exercise SLP Goal: Patient will Perform Home Exercise Program: with supervision, verbal cues required/provided SLP Goal: Perform Home Exercise Program - Progress: Progressing toward goal SLP Short Term Goals SLP Short Term Goal 1: Pt will name common objects/pictures with 85% acc via written response and min cues. SLP Short Term Goal 1 - Progress: Met SLP Short Term Goal 2: Pt will verbally name common objects/pictures with approximation of word when provided a model 70% of the time and max cues. SLP Short Term Goal 2 - Progress: Met SLP Short Term Goal 3: Pt will complete verbal single word sentence completion tasks (high frequency/automatic) with 80% acc and mod cues. SLP Short Term Goal 3 - Progress: Met SLP Short Term Goal 4: Pt will complete reading comprehension tasks (1-2 sentence level) with 80% acc and mod assist via multiple choice response. SLP Short Term Goal 4 - Progress: Met SLP Short Term Goal 5: Pt will be able to write basic personal/bio information with 100% acc and min cues. SLP Short Term Goal 5 - Progress: Met Additional SLP Short Term Goals?: Yes SLP Short Term Goal 6: Pt will complete repetitive apraxia drills (phoneme level) with 80% acc and mod/max assist. SLP Short Term Goal 6 - Progress: Met SLP Long Term Goals SLP Long Term Goal  1: Pt will increase verbal expression skills to [facility] for short phrases with use of compensatory strategies as needed. SLP Long Term Goal 1 - Progress: Progressing toward goal SLP Long Term Goal 2: Pt will increase reading comprehension skills to [facility] for short paragraph length material with use of strategies. SLP Long Term Goal 2 - Progress: Progressing toward goal  Assessment/Plan  Patient Active Problem List  Diagnosis  . CANDIDIASIS, ORAL  . LYMPHOMA  . MULTIPLE  MYELOMA  . ANXIETY  . HYPERTENSION  . MYOCARDIAL INFARCTION  . CAD, dating to 1996 with multiple PCIs, Stents to RCA and LCX, Last cath 2007 patent stents,30% prox LAD lesion, EF 45%  . ASTHMA, UNSPECIFIED  . NEPHROLITHIASIS  . COUGH  . ELEVATED PROSTATE SPECIFIC ANTIGEN  . Nonspecific (abnormal) findings on radiological and other examination of body structure  . ROTATOR CUFF REPAIR, RIGHT, HX OF  . ABNORMAL LUNG XRAY  . Hypogammaglobulinemia  . Cerebral embolism with cerebral infarction  . Mute  . 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 Hx of bladder cancer  . Hyperlipidemia LDL goal < 100  . Prediabetes  . Hypokalemia  . Expressive aphasia  . Hemiplegia affecting right dominant side   SLP - End of Session Activity Tolerance: Patient tolerated treatment well General Behavior During Session: [facility] for tasks performed Cognition: [facility] for tasks performed  SLP Assessment/Plan Clinical Impression Statement: Mr. Quattrone has met all short term goals so goals will be updated on Monday. Intelligibility and accuracy of multisyllabic words improves with second reading of word. He was cued to try to write the word when he has  trouble pronouncing it in conversation, however he has a difficult time spelling it. Reading comprehension improves with verbal mediation. Speech Therapy Frequency: min 3x week Duration: 4 weeks Treatment/Interventions: Patient/family  education;SLP instruction and feedback;Compensatory strategies;Oral motor exercises;Multimodal communcation approach;Language facilitation Potential to Achieve Goals: Good Potential Considerations: Severity of impairments    Abundio Teuscher 11/05/2012, 5:40 PM

## 2012-11-06 ENCOUNTER — Other Ambulatory Visit (HOSPITAL_COMMUNITY): Payer: Self-pay | Admitting: Oncology

## 2012-11-06 DIAGNOSIS — C9000 Multiple myeloma not having achieved remission: Secondary | ICD-10-CM

## 2012-11-06 MED ORDER — LENALIDOMIDE 10 MG PO CAPS: 10.0000 mg | ORAL_CAPSULE | Freq: Every day | ORAL | Status: DC

## 2012-11-09 ENCOUNTER — Ambulatory Visit (HOSPITAL_COMMUNITY)
Admission: RE | Admit: 2012-11-09 | Discharge: 2012-11-09 | Disposition: A | Discharge status: Home / Self Care | Payer: Medicare Other | Source: Ambulatory Visit | Attending: Chiropractic Medicine | Admitting: Chiropractic Medicine

## 2012-11-09 NOTE — Progress Notes (Signed)
Speech Language Pathology Treatment Patient Details  Name: Jeremy Parrish MRN: [id number] Date of Birth: 03-Jun-1941  Today's Date: 11/09/2012 Time: 6962-9528 SLP Time Calculation (min): 50 min  Authorization: Medicare  Authorization Time Period: 10/15/2012-11/13/2011  Authorization Visit#:  9 of 12    HPI:  Symptoms/Limitations Symptoms: Jeremy Parrish is doing well, accompanied by his wife. Pain Assessment Currently in Pain?: No/denies  Assessments Cognition [place] [employer] Scales of Cognitive Functioning: Purposeful/appropriate  Treatment  Aphasia Therapy Apraxia Therapy Patient/Family Education Home Exercise Program  SLP Goals  Home Exercise SLP Goal: Patient will Perform Home Exercise Program: with supervision, verbal cues required/provided SLP Goal: Perform Home Exercise Program - Progress: Progressing toward goal SLP Short Term Goals SLP Short Term Goal 1: Pt will name common objects/pictures with 85% acc via written response and min cues. SLP Short Term Goal 1 - Progress: Met (change to 100% and min cues.) SLP Short Term Goal 2: Pt will verbally name common objects/pictures with approximation of word when provided a model 70% of the time and max cues. SLP Short Term Goal 2 - Progress: Met (change to 100% acc and min cues) SLP Short Term Goal 3: Pt will complete verbal single word sentence completion tasks (high frequency/automatic) with 80% acc and mod cues. SLP Short Term Goal 3 - Progress: Met SLP Short Term Goal 4: Pt will complete reading comprehension tasks (1-2 sentence level) with 80% acc and mod assist via multiple choice response. SLP Short Term Goal 4 - Progress: Met (change to RC of 4+ sentence story with 90% and min assist) SLP Short Term Goal 5: Pt will be able to write basic personal/bio information with 100% acc and min cues. SLP Short Term Goal 5 - Progress: Met (change to writing short grocery lists, phone message to 90% ) Additional SLP Short Term  Goals?: Yes SLP Short Term Goal 6: Pt will complete repetitive apraxia drills (phoneme level) with 80% acc and mod/max assist. SLP Short Term Goal 6 - Progress: Met (Change to apraxia drills with 90% acc and min assist) SLP Short Term Goal 7: NEW GOAL: Pt will complete mod level verbal expression tasks (object description, state function, provide short summary, etc) using short sentences with 90% acc and min a. SLP Long Term Goals SLP Long Term Goal 1: Pt will increase verbal expression skills to [facility] for short phrases with use of compensatory strategies as needed. SLP Long Term Goal 1 - Progress: Progressing toward goal SLP Long Term Goal 2: Pt will increase reading comprehension skills to [facility] for short paragraph length material with use of strategies. SLP Long Term Goal 2 - Progress: Progressing toward goal  Assessment/Plan  Patient Active Problem List  Diagnosis  . CANDIDIASIS, ORAL  . LYMPHOMA  . MULTIPLE  MYELOMA  . ANXIETY  . HYPERTENSION  . MYOCARDIAL INFARCTION  . CAD, dating to 1996 with multiple PCIs, Stents to RCA and LCX, Last cath 2007 patent stents,30% prox LAD lesion, EF 45%  . ASTHMA, UNSPECIFIED  . NEPHROLITHIASIS  . COUGH  . ELEVATED PROSTATE SPECIFIC ANTIGEN  . Nonspecific (abnormal) findings on radiological and other examination of body structure  . ROTATOR CUFF REPAIR, RIGHT, HX OF  . ABNORMAL LUNG XRAY  . Hypogammaglobulinemia  . Cerebral embolism with cerebral infarction  . Mute  . 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 Hx of bladder cancer  . Hyperlipidemia LDL goal < 100  . Prediabetes  . Hypokalemia  .  Expressive aphasia  . Hemiplegia affecting right dominant side   SLP - End of Session Activity Tolerance: Patient tolerated treatment well General Behavior During Session: [facility] for tasks performed Cognition: [facility] for tasks performed  SLP Assessment/Plan Clinical Impression Statement:  Jeremy Parrish has made tremendous progress in his verbal expression abilities. He was unable to say a single word during our evaluation and he is now able to speak at the conversation level with some delays and decreased naturalness in delivery. He is generally able to identify speech errors and attmepts to correct them independently. He of course, has more difficulty with multisyllabic words, but when cued to repeat the word a second time, intelligibility improves. He continues to complete all assignments at home and is very motivated to improve his speech further. Recommend continued speech therapy 2x/week for 4 more weeks with reassessment of need at that time.  Speech Therapy Frequency: min 2x/week Duration: 4 weeks Treatment/Interventions: Patient/family education;SLP instruction and feedback;Compensatory strategies;Multimodal communcation approach;Language facilitation Potential to Achieve Goals: Good  GN Functional Limitations: Spoken language expressive Spoken Language Expression Current Status [phone]): At least 40 percent but less than 60 percent impaired, limited or restricted Spoken Language Expression Goal Status [phone]): At least 1 percent but less than 20 percent impaired, limited or restricted   Thank you,  Havery Moros, CCC-SLP [phone]                                                     Physician Treatment Plan  Your signature is required to indicate approval of the treatment plan/progress as stated above. Please make a copy of this report for your files and return this physician signed original in the self-addressed envelope or fax to [phone]. COMMENTS/CHANGES:__________________________________________________________________________________________________________________________   ____________________________________                      ____________________ PHYSICIAN SIGNATURE                                                    DATE      Anabel Lykins 11/09/2012, 5:19 PM

## 2012-11-10 ENCOUNTER — Inpatient Hospital Stay (HOSPITAL_COMMUNITY): Admission: RE | Admit: 2012-11-10 | Payer: Medicare Other | Source: Ambulatory Visit | Admitting: Speech Pathology

## 2012-11-10 DIAGNOSIS — D485 Neoplasm of uncertain behavior of skin: Secondary | ICD-10-CM | POA: Diagnosis not present

## 2012-11-10 DIAGNOSIS — L570 Actinic keratosis: Secondary | ICD-10-CM | POA: Diagnosis not present

## 2012-11-10 DIAGNOSIS — Z85828 Personal history of other malignant neoplasm of skin: Secondary | ICD-10-CM | POA: Diagnosis not present

## 2012-11-10 DIAGNOSIS — C44621 Squamous cell carcinoma of skin of unspecified upper limb, including shoulder: Secondary | ICD-10-CM | POA: Diagnosis not present

## 2012-11-10 DIAGNOSIS — B078 Other viral warts: Secondary | ICD-10-CM | POA: Diagnosis not present

## 2012-11-12 ENCOUNTER — Ambulatory Visit (HOSPITAL_COMMUNITY)
Admission: RE | Admit: 2012-11-12 | Discharge: 2012-11-12 | Disposition: A | Discharge status: Home / Self Care | Payer: Medicare Other | Source: Ambulatory Visit | Attending: Neurology | Admitting: Neurology

## 2012-11-12 DIAGNOSIS — IMO0001 Reserved for inherently not codable concepts without codable children: Secondary | ICD-10-CM | POA: Diagnosis not present

## 2012-11-12 DIAGNOSIS — I69920 Aphasia following unspecified cerebrovascular disease: Secondary | ICD-10-CM | POA: Insufficient documentation

## 2012-11-12 NOTE — Progress Notes (Signed)
Speech Language Pathology Treatment Patient Details  Name: Jeremy Parrish MRN: [id number] Date of Birth: [birth year]  Today's Date: 11/12/2012 Time: 1345-1430 SLP Time Calculation (min): 45 min  Authorization: medicare  Authorization Time Period: 10/15/2012-11/13/2011   Authorization Visit#:   of     HPI:  Symptoms/Limitations Symptoms: Communicative and enthusiastic.  Wife and grandson present. Pain Assessment Currently in Pain?: No/denies Pain Score: 0-No pain      Treatment lang/speech facilitation for aphasia/apraxia  SLP Goals  Home Exercise SLP Goal: Patient will Perform Home Exercise Program: with supervision, verbal cues required/provided SLP Goal: Perform Home Exercise Program - Progress: Progressing toward goal SLP Short Term Goals SLP Short Term Goal 1: Pt will name common objects/pictures with 100% acc via written response and min cues SLP Short Term Goal 1 - Progress: Progressing toward goal SLP Short Term Goal 2: Pt will verbally name common objects/pictures with approximation of word when provided a model 100% of the time and min cues. SLP Short Term Goal 2 - Progress: Progressing toward goal SLP Short Term Goal 3: Pt will complete mod level verbal expression tasks (object description, state function, provide short summary, etc) using short sentences with 90% acc and min assist  SLP Short Term Goal 3 - Progress: Progressing toward goal SLP Short Term Goal 4: Pt will complete reading comprehension tasks (4+ sentence level) with 90% acc and min assist via multiple choice response SLP Short Term Goal 4 - Progress: Progressing toward goal SLP Short Term Goal 5: Pt will be able to write short grocery lists, phone messages with 90% acc and min cues SLP Short Term Goal 5 - Progress: Progressing toward goal SLP Short Term Goal 6: Pt will complete mod level apraxia drills with 90% acc and min assist SLP Short Term Goal 6 - Progress: Progressing toward goal SLP Short Term  Goal 7: Pt will complete mod level verbal expression tasks (object description, state function, provide short summary, etc) using short sentences with 90% acc and min a. SLP Long Term Goals SLP Long Term Goal 1: Pt will increase verbal expression skills to [facility] for short phrases with use of compensatory strategies as needed SLP Long Term Goal 1 - Progress: Progressing toward goal SLP Long Term Goal 2: Pt will increase reading comprehension skills to [facility] for short paragraph length material with use of strategies SLP Long Term Goal 2 - Progress: Progressing toward goal  Assessment/Plan  Patient Active Problem List  Diagnosis  . CANDIDIASIS, ORAL  . LYMPHOMA  . MULTIPLE  MYELOMA  . ANXIETY  . HYPERTENSION  . MYOCARDIAL INFARCTION  . CAD, dating to 1996 with multiple PCIs, Stents to RCA and LCX, Last cath 2007 patent stents,30% prox LAD lesion, EF 45%  . ASTHMA, UNSPECIFIED  . NEPHROLITHIASIS  . COUGH  . ELEVATED PROSTATE SPECIFIC ANTIGEN  . Nonspecific (abnormal) findings on radiological and other examination of body structure  . ROTATOR CUFF REPAIR, RIGHT, HX OF  . ABNORMAL LUNG XRAY  . Hypogammaglobulinemia  . Cerebral embolism with cerebral infarction  . Mute  . 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 Hx of bladder cancer  . Hyperlipidemia LDL goal < 100  . Prediabetes  . Hypokalemia  . Expressive aphasia  . Hemiplegia affecting right dominant side   SLP - End of Session Activity Tolerance: Patient tolerated treatment well General Behavior During Session: [name] for tasks performed Cognition: [facility] for tasks performed  SLP Assessment/Plan Clinical Impression  Statement: Pt accompanied by wife and grandson.  Communicated course of progress with improved use of supplementary communication (gesture, facial expression) to convey meaning. Demonstrated recognition of apraxic errors with attempts to self-correct.   Required verbal cues to decrease rate and and increase phonemic accuracy; to pause and return to word later when retrieval was difficult; and to to check clinician's understanding.  Oral reading of paragraph information revealed improved articulatory placement and clarity due to pt's slower phrasing and improved deliberation.  Pt appropriately tearful; extremely motivated by progress.      Speech Therapy Frequency: min 2x/week Duration: 4 weeks Treatment/Interventions: Patient/family education;SLP instruction and feedback;Compensatory strategies;Language facilitation Potential to Achieve Goals: Good  GN Functional Limitations: Spoken language expressive Spoken Language Expression Current Status [phone]): At least 40 percent but less than 60 percent impaired, limited or restricted Spoken Language Expression Goal Status [phone]): At least 1 percent but less than 20 percent impaired, limited or restricted  Blenda Mounts Laurice 11/12/2012, 3:08 PM

## 2012-11-16 ENCOUNTER — Encounter (HOSPITAL_COMMUNITY): Payer: Self-pay | Admitting: Oncology

## 2012-11-16 ENCOUNTER — Encounter (HOSPITAL_COMMUNITY): Payer: Medicare Other | Attending: Oncology | Admitting: Oncology

## 2012-11-16 ENCOUNTER — Encounter (HOSPITAL_COMMUNITY): Payer: Medicare Other

## 2012-11-16 VITALS — BP 151/80 | HR 82 | Temp 97.5°F | Resp 18 | Wt 194.3 lb

## 2012-11-16 DIAGNOSIS — Z8673 Personal history of transient ischemic attack (TIA), and cerebral infarction without residual deficits: Secondary | ICD-10-CM

## 2012-11-16 DIAGNOSIS — Z87898 Personal history of other specified conditions: Secondary | ICD-10-CM

## 2012-11-16 DIAGNOSIS — R197 Diarrhea, unspecified: Secondary | ICD-10-CM | POA: Diagnosis not present

## 2012-11-16 DIAGNOSIS — C8589 Other specified types of non-Hodgkin lymphoma, extranodal and solid organ sites: Secondary | ICD-10-CM | POA: Diagnosis not present

## 2012-11-16 DIAGNOSIS — C9000 Multiple myeloma not having achieved remission: Secondary | ICD-10-CM | POA: Insufficient documentation

## 2012-11-16 DIAGNOSIS — M719 Bursopathy, unspecified: Secondary | ICD-10-CM | POA: Diagnosis not present

## 2012-11-16 DIAGNOSIS — M7520 Bicipital tendinitis, unspecified shoulder: Secondary | ICD-10-CM | POA: Diagnosis not present

## 2012-11-16 DIAGNOSIS — M67919 Unspecified disorder of synovium and tendon, unspecified shoulder: Secondary | ICD-10-CM | POA: Diagnosis not present

## 2012-11-16 LAB — COMPREHENSIVE METABOLIC PANEL
BUN: 13 mg/dL (ref 6–23)
Calcium: 9.1 mg/dL (ref 8.4–10.5)
GFR calc Af Amer: >90 mL/min (ref >90)
GFR calc non Af Amer: 89 mL/min — ABNORMAL LOW (ref >90)
Glucose, Bld: 118 mg/dL — ABNORMAL HIGH (ref 70–99)
Total Protein: 7.1 g/dL (ref 6.0–8.3)

## 2012-11-16 LAB — CBC WITH DIFFERENTIAL/PLATELET
Eosinophils Absolute: 0 10*3/uL (ref 0.0–0.7)
Eosinophils Relative: 0 % (ref 0–5)
HCT: 38.8 % — ABNORMAL LOW (ref 39.0–52.0)
Hemoglobin: 12.8 g/dL — ABNORMAL LOW (ref 13.0–17.0)
Lymphs Abs: 0.8 10*3/uL (ref 0.7–4.0)
MCH: 29.8 pg (ref 26.0–34.0)
MCV: 90.2 fL (ref 78.0–100.0)
Monocytes Absolute: 0.2 10*3/uL (ref 0.1–1.0)
Monocytes Relative: 4 % (ref 3–12)
RBC: 4.3 MIL/uL (ref 4.22–5.81)

## 2012-11-16 NOTE — Progress Notes (Signed)
[name] presented for labwork. Labs per MD order drawn via Peripheral Line 23 gauge needle inserted in right AC   Good blood return present. Procedure without incident.  Needle removed intact. Patient tolerated procedure well.

## 2012-11-16 NOTE — Patient Instructions (Addendum)
[facility] Discharge Instructions  RECOMMENDATIONS MADE BY THE CONSULTANT AND ANY TEST RESULTS WILL BE SENT TO YOUR REFERRING PHYSICIAN.  EXAM FINDINGS BY THE PHYSICIAN TODAY AND SIGNS OR SYMPTOMS TO REPORT TO CLINIC OR PRIMARY PHYSICIAN: Exam and discussion by MD.  Your lungs are clear today.  Need to check a stool culture.  Bring it in within 30 minutes of obtaining the sample.  MEDICATIONS PRESCRIBED: none  INSTRUCTIONS GIVEN AND DISCUSSED: Report fevers, chills, recurring infections,etc.  SPECIAL INSTRUCTIONS/FOLLOW-UP: For zometa next week, blood work every 12 weeks and to be seen in follow-up in 3 months.  Thank you for choosing [facility] to provide your oncology and hematology care.  To afford each patient quality time with our providers, please arrive at least 15 minutes before your scheduled appointment time.  With your help, our goal is to use those 15 minutes to complete the necessary work-up to ensure our physicians have the information they need to help with your evaluation and healthcare recommendations.    Effective January 1st, 2014, we ask that you re-schedule your appointment with our physicians should you arrive 10 or more minutes late for your appointment.  We strive to give you quality time with our providers, and arriving late affects you and other patients whose appointments are after yours.    Again, thank you for choosing [facility].  Our hope is that these requests will decrease the amount of time that you wait before being seen by our physicians.       _____________________________________________________________  I acknowledge that I have been informed and understand all the instructions given to me and received a copy. I do not have any more questions at this time but understand that I may call the Cancer Center at [facility] at [phone] during business hours should I have any further questions or need  assistance in obtaining follow-up care.

## 2012-11-16 NOTE — Progress Notes (Signed)
Problem number 1 IgG lambda multiple myeloma status post 4 cycles of dexamethasone and Velcade followed by bone marrow transplant by Dr. 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 He still has very significant dysarthria but he can be understood the past the chart at the time. He has no obvious right leg weakness at this time. His right hand strength maybe slightly diminished. He is alert and oriented. He is accompanied by his wife. Problem #3 non-Hodgkin's lymphoma, stage IVb, low-grade B cell type symptomatic at presentation in July 2002 treated with 6 cycles of R. CHOP with complete response and PET scan in June 2012 showed no evidence recurrence. Problem #4 coronary artery disease status post stent placement x5 in the past Problem #5 COPD with bronchiectasis occasionally requiring antibiotics as well as support with IVIG Problem #6 hypogammaglobulinemia Problem #7 skin cancer the right ear status post surgery by Dr. Park Liter Problem #8 osteoporosis on therapy with zoledronic acid calcium and vitamin D Problem #9 peripheral neuropathy grade 1, much improved Problem #10 ongoing diarrhea and we will consult Dr. 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 Aldean Ast, etc. years ago. Problem #12 history of bladder cancer [name] has survived many many issues most recent is the stroke. His speech is difficult at times but he is doing better. He was totally aphasia get 1 point. His biggest complaint is loose stools. He will go 4-5 times a day and take Imodium or other medications that will stop for couple days only. He does not lose weight and has no blood in his stool he is aware of. He has no B. symptoms presently. His lungs are doing quite well. He is not coughing up yellow or Green phlegm presently.  BP 151/80  Pulse 82  Temp 97.5 F (36.4 C)  (Oral)  Resp 18  Wt 194 lb 4.8 oz (88.134 kg)  He is in no acute distress. He has no lymphadenopathy. Lungs are perfectly clear to auscultation and percussion today. Pacemaker in the left upper chest walls intact Port-A-Cath in the right upper chest wall is intact. Heart shows a regular rhythm at this time. There is no murmur rub or gallop. Abdomen is soft nontender without organomegaly. He has no leg edema. He follows all commands. He does have a scar on his right eye where he had 3 stitches placed because of inversion of the right lower eyelid.  Overall I think he has done great considering everything he has encountered. I hope the Revlimid has not contributed to his stroke but he has been on it for quite some time. He has numerous other risk factors however. So we will continue it for right now. He is time to check his labs. We will see him every 3 months

## 2012-11-17 ENCOUNTER — Ambulatory Visit (HOSPITAL_COMMUNITY)
Admission: RE | Admit: 2012-11-17 | Discharge: 2012-11-17 | Disposition: A | Discharge status: Home / Self Care | Payer: Medicare Other | Source: Ambulatory Visit | Attending: Chiropractic Medicine | Admitting: Chiropractic Medicine

## 2012-11-17 ENCOUNTER — Other Ambulatory Visit (HOSPITAL_COMMUNITY): Payer: Self-pay

## 2012-11-17 DIAGNOSIS — C9000 Multiple myeloma not having achieved remission: Secondary | ICD-10-CM | POA: Diagnosis not present

## 2012-11-17 DIAGNOSIS — C8589 Other specified types of non-Hodgkin lymphoma, extranodal and solid organ sites: Secondary | ICD-10-CM | POA: Diagnosis not present

## 2012-11-17 LAB — KAPPA/LAMBDA LIGHT CHAINS: Kappa free light chain: 1.47 mg/dL (ref 0.33–1.94)

## 2012-11-17 NOTE — Progress Notes (Signed)
Speech Language Pathology Treatment Patient Details  Name: Jeremy Parrish MRN: [id number] Date of Birth: April 16, 1941  Today's Date: 11/17/2012 Time: 1050-1135 SLP Time Calculation (min): 45 min  Authorization: medicare  Authorization Time Period: 11/12/2012-12/10/2012  Authorization Visit#:  11 of     HPI:  Symptoms/Limitations Symptoms: Excited for therapy Pain Assessment Currently in Pain?: No/denies   Treatment  Apraxia Therapy Aphasia Therapy Patient/Family Education Home Exercise Program  SLP Goals  Home Exercise SLP Goal: Patient will Perform Home Exercise Program: with supervision, verbal cues required/provided SLP Goal: Perform Home Exercise Program - Progress: Progressing toward goal SLP Short Term Goals SLP Short Term Goal 1: Pt will name common objects/pictures with 100% acc via written response and min cues SLP Short Term Goal 1 - Progress: Progressing toward goal SLP Short Term Goal 2: Pt will verbally name common objects/pictures with approximation of word when provided a model 100% of the time and min cues. SLP Short Term Goal 2 - Progress: Progressing toward goal SLP Short Term Goal 3: Pt will complete mod level verbal expression tasks (object description, state function, provide short summary, etc) using short sentences with 90% acc and min assist  SLP Short Term Goal 3 - Progress: Progressing toward goal SLP Short Term Goal 4: Pt will complete reading comprehension tasks (4+ sentence level) with 90% acc and min assist via multiple choice response SLP Short Term Goal 4 - Progress: Progressing toward goal SLP Short Term Goal 5: Pt will be able to write short grocery lists, phone messages with 90% acc and min cues SLP Short Term Goal 5 - Progress: Progressing toward goal SLP Short Term Goal 6: Pt will complete mod level apraxia drills with 90% acc and min assist SLP Short Term Goal 6 - Progress: Progressing toward goal SLP Long Term Goals SLP Long Term Goal 1: Pt  will increase verbal expression skills to [facility] for short phrases with use of compensatory strategies as needed SLP Long Term Goal 1 - Progress: Progressing toward goal SLP Long Term Goal 2: Pt will increase reading comprehension skills to [facility] for short paragraph length material with use of strategies SLP Long Term Goal 2 - Progress: Progressing toward goal  Assessment/Plan  Patient Active Problem List  Diagnosis  . CANDIDIASIS, ORAL  . LYMPHOMA  . MULTIPLE  MYELOMA  . ANXIETY  . HYPERTENSION  . MYOCARDIAL INFARCTION  . CAD, dating to 1996 with multiple PCIs, Stents to RCA and LCX, Last cath 2007 patent stents,30% prox LAD lesion, EF 45%  . ASTHMA, UNSPECIFIED  . NEPHROLITHIASIS  . COUGH  . ELEVATED PROSTATE SPECIFIC ANTIGEN  . Nonspecific (abnormal) findings on radiological and other examination of body structure  . ROTATOR CUFF REPAIR, RIGHT, HX OF  . ABNORMAL LUNG XRAY  . Hypogammaglobulinemia  . Cerebral embolism with cerebral infarction  . Mute  . 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 Hx of bladder cancer  . Hyperlipidemia LDL goal < 100  . Prediabetes  . Hypokalemia  . Expressive aphasia  . Hemiplegia affecting right dominant side   SLP - End of Session Activity Tolerance: Patient tolerated treatment well General Behavior During Session: [facility] for tasks performed Cognition: [facility] for tasks performed  SLP Assessment/Plan Clinical Impression Statement: Jeremy Parrish drove himself to therapy today and his wife accompanied him after his appointment. SLP notices subjective improvement in fluency and tone since last week. He identifies and self corrects (as able) apraxic errors in  conversation and practices words that are particularly challenging for him (therapy, football, challenge). Improved accuracy with repetition. His wife says that she only needed to give him min cues to complete reading comprehension  paragraph task. Jeremy Parrish continues to be highly motivated and is eager to have his follow up appointment with Dr. Pearlean Brownie in hopes he will release him for driving. Speech Therapy Frequency: min 2x/week Duration: 4 weeks Treatment/Interventions: Patient/family education;SLP instruction and feedback;Compensatory strategies;Language facilitation Potential to Achieve Goals: Good    Jeremy Parrish 11/17/2012, 6:41 PM

## 2012-11-18 DIAGNOSIS — H43819 Vitreous degeneration, unspecified eye: Secondary | ICD-10-CM | POA: Diagnosis not present

## 2012-11-18 DIAGNOSIS — H52229 Regular astigmatism, unspecified eye: Secondary | ICD-10-CM | POA: Diagnosis not present

## 2012-11-18 DIAGNOSIS — H5200 Hypermetropia, unspecified eye: Secondary | ICD-10-CM | POA: Diagnosis not present

## 2012-11-18 DIAGNOSIS — H524 Presbyopia: Secondary | ICD-10-CM | POA: Diagnosis not present

## 2012-11-19 ENCOUNTER — Ambulatory Visit (HOSPITAL_COMMUNITY)
Admission: RE | Admit: 2012-11-19 | Discharge: 2012-11-19 | Disposition: A | Discharge status: Home / Self Care | Payer: Medicare Other | Source: Ambulatory Visit | Attending: Chiropractic Medicine | Admitting: Chiropractic Medicine

## 2012-11-19 LAB — MULTIPLE MYELOMA PANEL, SERUM
Albumin ELP: 59.6 % (ref 55.8–66.1)
Alpha-1-Globulin: 5.1 % — ABNORMAL HIGH (ref 2.9–4.9)
Alpha-2-Globulin: 10.7 % (ref 7.1–11.8)
Beta 2: 5 % (ref 3.2–6.5)
Beta Globulin: 7 % (ref 4.7–7.2)
Gamma Globulin: 12.6 % (ref 11.1–18.8)
IgA: 214 mg/dL (ref 68–379)
IgG (Immunoglobin G), Serum: 844 mg/dL (ref 650–1600)
IgM, Serum: 23 mg/dL — ABNORMAL LOW (ref 41–251)
M-Spike, %: NOT DETECTED g/dL
Total Protein: 6.5 g/dL (ref 6.0–8.3)

## 2012-11-19 NOTE — Progress Notes (Signed)
Speech Language Pathology Treatment Patient Details  Name: SHAD LEDVINA MRN: [id number] Date of Birth: 26-Jul-1941  Today's Date: 11/19/2012 Time: 2725-3664 SLP Time Calculation (min): 45 min  Authorization: medicare  Authorization Time Period: 11/12/2012-12/10/2012  Authorization Visit#:  12 of     HPI:  Symptoms/Limitations Symptoms: Feeling good, accompanied by wife Sheppard Evens) Pain Assessment Currently in Pain?: No/denies   Treatment  Apraxia Therapy Aphasia Therapy Patient/Family Education Home Exercise Program  SLP Goals  Home Exercise SLP Goal: Patient will Perform Home Exercise Program: with supervision, verbal cues required/provided SLP Goal: Perform Home Exercise Program - Progress: Progressing toward goal SLP Short Term Goals SLP Short Term Goal 1: Pt will name common objects/pictures with 100% acc via written response and min cues SLP Short Term Goal 1 - Progress: Progressing toward goal SLP Short Term Goal 2: Pt will verbally name common objects/pictures with approximation of word when provided a model 100% of the time and min cues. SLP Short Term Goal 2 - Progress: Progressing toward goal SLP Short Term Goal 3: Pt will complete mod level verbal expression tasks (object description, state function, provide short summary, etc) using short sentences with 90% acc and min assist  SLP Short Term Goal 3 - Progress: Progressing toward goal SLP Short Term Goal 4: Pt will complete reading comprehension tasks (4+ sentence level) with 90% acc and min assist via multiple choice response SLP Short Term Goal 4 - Progress: Progressing toward goal SLP Short Term Goal 5: Pt will be able to write short grocery lists, phone messages with 90% acc and min cues SLP Short Term Goal 5 - Progress: Progressing toward goal SLP Short Term Goal 6: Pt will complete mod level apraxia drills with 90% acc and min assist SLP Short Term Goal 6 - Progress: Progressing toward goal SLP Long Term  Goals SLP Long Term Goal 1: Pt will increase verbal expression skills to [facility] for short phrases with use of compensatory strategies as needed SLP Long Term Goal 1 - Progress: Progressing toward goal SLP Long Term Goal 2: Pt will increase reading comprehension skills to [facility] for short paragraph length material with use of strategies SLP Long Term Goal 2 - Progress: Progressing toward goal  Assessment/Plan  Patient Active Problem List  Diagnosis  . CANDIDIASIS, ORAL  . LYMPHOMA  . MULTIPLE  MYELOMA  . ANXIETY  . HYPERTENSION  . MYOCARDIAL INFARCTION  . CAD, dating to 1996 with multiple PCIs, Stents to RCA and LCX, Last cath 2007 patent stents,30% prox LAD lesion, EF 45%  . ASTHMA, UNSPECIFIED  . NEPHROLITHIASIS  . COUGH  . ELEVATED PROSTATE SPECIFIC ANTIGEN  . Nonspecific (abnormal) findings on radiological and other examination of body structure  . ROTATOR CUFF REPAIR, RIGHT, HX OF  . ABNORMAL LUNG XRAY  . Hypogammaglobulinemia  . Cerebral embolism with cerebral infarction  . Mute  . 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 Hx of bladder cancer  . Hyperlipidemia LDL goal < 100  . Prediabetes  . Hypokalemia  . Expressive aphasia  . Hemiplegia affecting right dominant side   SLP - End of Session Activity Tolerance: Patient tolerated treatment well General Behavior During Session: [facility] for tasks performed Cognition: [facility] for tasks performed  SLP Assessment/Plan Clinical Impression Statement: Mr. Kasler completed homework as assigned and completed with min to no assist from his wife. Sequencing the scrambled sentences took him some extra time, but he felt it was good for him  to do. Apraxia drills via alveolar minimal pair words were completed in session and he did a GREAT job. He had a few errors and was cued to write/copy the word and sound it out but needed phonemic and/or SWSC cues to complete.  Speech Therapy  Frequency: min 2x/week Duration: 4 weeks Treatment/Interventions: Patient/family education;SLP instruction and feedback;Compensatory strategies;Language facilitation Potential to Achieve Goals: Good     PORTER,DABNEY 11/19/2012, 12:32 PM

## 2012-11-20 ENCOUNTER — Other Ambulatory Visit (HOSPITAL_COMMUNITY): Payer: Self-pay | Admitting: Oncology

## 2012-11-21 LAB — STOOL CULTURE

## 2012-11-23 ENCOUNTER — Ambulatory Visit (HOSPITAL_COMMUNITY)
Admission: RE | Admit: 2012-11-23 | Discharge: 2012-11-23 | Disposition: A | Discharge status: Home / Self Care | Payer: Medicare Other | Source: Ambulatory Visit | Attending: Chiropractic Medicine | Admitting: Chiropractic Medicine

## 2012-11-23 NOTE — Progress Notes (Signed)
Speech Language Pathology Treatment Patient Details  Name: XZANDER GILHAM MRN: [id number] Date of Birth: 1941/07/01  Today's Date: 11/23/2012 Time: 1115-1215 SLP Time Calculation (min): 60 min  Authorization: medicare  Authorization Time Period: 11/12/2012-12/10/2012  Authorization Visit#:  13 of     HPI:  Symptoms/Limitations Symptoms: "Doing pretty good." Pain Assessment Currently in Pain?: No/denies   Treatment  Apraxia Therapy Aphasia Therapy Patient/Family Education Home Exercise Program  SLP Goals  Home Exercise SLP Goal: Patient will Perform Home Exercise Program: with supervision, verbal cues required/provided SLP Goal: Perform Home Exercise Program - Progress: Progressing toward goal SLP Short Term Goals SLP Short Term Goal 1: Pt will name common objects/pictures with 100% acc via written response and min cues SLP Short Term Goal 1 - Progress: Progressing toward goal SLP Short Term Goal 2: Pt will verbally name common objects/pictures with approximation of word when provided a model 100% of the time and min cues. SLP Short Term Goal 2 - Progress: Progressing toward goal SLP Short Term Goal 3: Pt will complete mod level verbal expression tasks (object description, state function, provide short summary, etc) using short sentences with 90% acc and min assist  SLP Short Term Goal 3 - Progress: Progressing toward goal SLP Short Term Goal 4: Pt will complete reading comprehension tasks (4+ sentence level) with 90% acc and min assist via multiple choice response SLP Short Term Goal 4 - Progress: Progressing toward goal SLP Short Term Goal 5: Pt will be able to write short grocery lists, phone messages with 90% acc and min cues SLP Short Term Goal 5 - Progress: Progressing toward goal SLP Short Term Goal 6: Pt will complete mod level apraxia drills with 90% acc and min assist SLP Short Term Goal 6 - Progress: Progressing toward goal SLP Long Term Goals SLP Long Term Goal 1: Pt  will increase verbal expression skills to [facility] for short phrases with use of compensatory strategies as needed SLP Long Term Goal 1 - Progress: Progressing toward goal SLP Long Term Goal 2: Pt will increase reading comprehension skills to [facility] for short paragraph length material with use of strategies SLP Long Term Goal 2 - Progress: Progressing toward goal  Assessment/Plan  Patient Active Problem List  Diagnosis  . CANDIDIASIS, ORAL  . LYMPHOMA  . MULTIPLE  MYELOMA  . ANXIETY  . HYPERTENSION  . MYOCARDIAL INFARCTION  . CAD, dating to 1996 with multiple PCIs, Stents to RCA and LCX, Last cath 2007 patent stents,30% prox LAD lesion, EF 45%  . ASTHMA, UNSPECIFIED  . NEPHROLITHIASIS  . COUGH  . ELEVATED PROSTATE SPECIFIC ANTIGEN  . Nonspecific (abnormal) findings on radiological and other examination of body structure  . ROTATOR CUFF REPAIR, RIGHT, HX OF  . ABNORMAL LUNG XRAY  . Hypogammaglobulinemia  . Cerebral embolism with cerebral infarction  . Mute  . 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 Hx of bladder cancer  . Hyperlipidemia LDL goal < 100  . Prediabetes  . Hypokalemia  . Expressive aphasia  . Hemiplegia affecting right dominant side   SLP - End of Session Activity Tolerance: Patient tolerated treatment well General Behavior During Session: [facility] for tasks performed Cognition: [facility] for tasks performed  SLP Assessment/Plan Clinical Impression Statement: Mr. Sanchez was accompanied by his wife, Tristen Luce. They are some distressed that they have not been able to get a follow up appointment with Dr. Pearlean Brownie. I sent a message last week and will  send a fax today, but encouraged his wife to keep calling. Mr. Fawcett found homework to be very challenging, but he was persistant and was able to complete most of it with some assist from wife. He was able to complete word matrix deductive naming tasks, but did not  realize that each word needed to beging with a designated letter. Speech was mildly less intelligible today,however he is using a broader and more complex vocabulary. He benefits from decreasing rate of delivery with multisyllabic words. When making a 10-item grocery list I was able to read 90% of the list (not broccoli). He is very concerned with spelling and needs cues to just try to write the word as best as he can. Overall spelling was ~80% accurate. Speech Therapy Frequency: min 2x/week Duration: 4 weeks Treatment/Interventions: Patient/family education;SLP instruction and feedback;Compensatory strategies;Language facilitation Potential to Achieve Goals: Good   Thank you,  Havery Moros, CCC-SLP [phone]  Densel Kronick 11/23/2012, 1:53 PM

## 2012-11-24 ENCOUNTER — Encounter (HOSPITAL_BASED_OUTPATIENT_CLINIC_OR_DEPARTMENT_OTHER): Payer: Medicare Other

## 2012-11-24 VITALS — BP 147/81 | HR 66 | Temp 97.6°F | Resp 18

## 2012-11-24 DIAGNOSIS — C8589 Other specified types of non-Hodgkin lymphoma, extranodal and solid organ sites: Secondary | ICD-10-CM

## 2012-11-24 DIAGNOSIS — C9000 Multiple myeloma not having achieved remission: Secondary | ICD-10-CM | POA: Diagnosis not present

## 2012-11-24 MED ORDER — SODIUM CHLORIDE 0.9 % IV SOLN
Freq: Once | INTRAVENOUS | Status: AC
  Administered 2012-11-24: 14:00:00 via INTRAVENOUS

## 2012-11-24 MED ORDER — HEPARIN SOD (PORK) LOCK FLUSH 100 UNIT/ML IV SOLN
500.0000 [IU] | Freq: Once | INTRAVENOUS | Status: AC | PRN
  Administered 2012-11-24: 500 [IU]
  Filled 2012-11-24: qty 5

## 2012-11-24 MED ORDER — ZOLEDRONIC ACID 4 MG/5ML IV CONC
4.0000 mg | Freq: Once | INTRAVENOUS | Status: AC
  Administered 2012-11-24: 4 mg via INTRAVENOUS
  Filled 2012-11-24: qty 5

## 2012-11-24 MED ORDER — HEPARIN SOD (PORK) LOCK FLUSH 100 UNIT/ML IV SOLN
INTRAVENOUS | Status: AC
  Filled 2012-11-24: qty 5

## 2012-11-24 MED ORDER — SODIUM CHLORIDE 0.9 % IJ SOLN
10.0000 mL | INTRAMUSCULAR | Status: DC | PRN
  Administered 2012-11-24: 10 mL
  Filled 2012-11-24: qty 10

## 2012-11-24 NOTE — Progress Notes (Signed)
Tolerated infusion well. 

## 2012-11-26 ENCOUNTER — Ambulatory Visit (HOSPITAL_COMMUNITY)
Admission: RE | Admit: 2012-11-26 | Discharge: 2012-11-26 | Disposition: A | Discharge status: Home / Self Care | Payer: Medicare Other | Source: Ambulatory Visit | Attending: Neurology | Admitting: Neurology

## 2012-11-26 NOTE — Progress Notes (Signed)
Speech Language Pathology Treatment Patient Details  Name: Jeremy Parrish MRN: [id number] Date of Birth: 24-Jul-1941  Today's Date: 11/26/2012 Time: 1300-1400 SLP Time Calculation (min): 60 min  Authorization: medicare  Authorization Time Period: 11/12/2012-12/10/2012  Authorization Visit#:  14 of     HPI:  Symptoms/Limitations Symptoms: Doing well. Pain Assessment Currently in Pain?: No/denies   Treatment  Apraxia Therapy Aphasia Therapy Patient/Family Education Home Exercise Program  SLP Goals  Home Exercise SLP Goal: Patient will Perform Home Exercise Program: with supervision, verbal cues required/provided SLP Goal: Perform Home Exercise Program - Progress: Progressing toward goal SLP Short Term Goals SLP Short Term Goal 1: Pt will name common objects/pictures with 100% acc via written response and min cues SLP Short Term Goal 1 - Progress: Progressing toward goal SLP Short Term Goal 2: Pt will verbally name common objects/pictures with approximation of word when provided a model 100% of the time and min cues. SLP Short Term Goal 2 - Progress: Progressing toward goal SLP Short Term Goal 3: Pt will complete mod level verbal expression tasks (object description, state function, provide short summary, etc) using short sentences with 90% acc and min assist  SLP Short Term Goal 3 - Progress: Progressing toward goal SLP Short Term Goal 4: Pt will complete reading comprehension tasks (4+ sentence level) with 90% acc and min assist via multiple choice response SLP Short Term Goal 4 - Progress: Progressing toward goal SLP Short Term Goal 5: Pt will be able to write short grocery lists, phone messages with 90% acc and min cues SLP Short Term Goal 5 - Progress: Progressing toward goal SLP Short Term Goal 6: Pt will complete mod level apraxia drills with 90% acc and min assist SLP Short Term Goal 6 - Progress: Progressing toward goal SLP Long Term Goals SLP Long Term Goal 1: Pt will  increase verbal expression skills to [facility] for short phrases with use of compensatory strategies as needed SLP Long Term Goal 1 - Progress: Progressing toward goal SLP Long Term Goal 2: Pt will increase reading comprehension skills to [facility] for short paragraph length material with use of strategies SLP Long Term Goal 2 - Progress: Progressing toward goal  Assessment/Plan  Patient Active Problem List  Diagnosis  . CANDIDIASIS, ORAL  . LYMPHOMA  . MULTIPLE  MYELOMA  . ANXIETY  . HYPERTENSION  . MYOCARDIAL INFARCTION  . CAD, dating to 1996 with multiple PCIs, Stents to RCA and LCX, Last cath 2007 patent stents,30% prox LAD lesion, EF 45%  . ASTHMA, UNSPECIFIED  . NEPHROLITHIASIS  . COUGH  . ELEVATED PROSTATE SPECIFIC ANTIGEN  . Nonspecific (abnormal) findings on radiological and other examination of body structure  . ROTATOR CUFF REPAIR, RIGHT, HX OF  . ABNORMAL LUNG XRAY  . Hypogammaglobulinemia  . Cerebral embolism with cerebral infarction  . Mute  . 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 Hx of bladder cancer  . Hyperlipidemia LDL goal < 100  . Prediabetes  . Hypokalemia  . Expressive aphasia  . Hemiplegia affecting right dominant side   SLP - End of Session Activity Tolerance: Patient tolerated treatment well General Behavior During Session: [facility] for tasks performed Cognition: [facility] for tasks performed  SLP Assessment/Plan Clinical Impression Statement: [name] did a great job with his reading comprehension homework. He read aloud one paragraph over several attempts. He was cued to decrease rate, take a break, or move on when he gets stuck on a  word. He was able to provide an accurate summary of the story during retelling task. He needed moderate cues during moderate level verbal expression task (Taboo) and attempted to augment his speech with gestures. It was fairly difficult for him to describe a word beyond a  general description ("I have these." for headache). Speech Therapy Frequency: min 2x/week Duration: 4 weeks Treatment/Interventions: Patient/family education;SLP instruction and feedback;Compensatory strategies;Language facilitation Potential to Achieve Goals: Good    Jeremy Parrish,Jeremy Parrish 11/26/2012, 2:55 PM

## 2012-11-30 ENCOUNTER — Other Ambulatory Visit (HOSPITAL_COMMUNITY): Payer: Self-pay | Admitting: Oncology

## 2012-11-30 ENCOUNTER — Ambulatory Visit (HOSPITAL_COMMUNITY)
Admission: RE | Admit: 2012-11-30 | Discharge: 2012-11-30 | Disposition: A | Discharge status: Home / Self Care | Payer: Medicare Other | Source: Ambulatory Visit | Attending: Chiropractic Medicine | Admitting: Chiropractic Medicine

## 2012-11-30 DIAGNOSIS — J069 Acute upper respiratory infection, unspecified: Secondary | ICD-10-CM

## 2012-11-30 MED ORDER — SULFAMETHOXAZOLE-TRIMETHOPRIM 800-160 MG PO TABS: 1.0000 | ORAL_TABLET | Freq: Two times a day (BID) | ORAL | Status: DC

## 2012-11-30 NOTE — Progress Notes (Signed)
Speech Language Pathology Treatment Patient Details  Name: TIMOUTHY GILARDI MRN: [id number] Date of Birth: 12-09-1940  Today's Date: 11/30/2012 Time: 1122-1206 SLP Time Calculation (min): 44 min  Authorization: medicare  Authorization Time Period: 11/12/2012-12/10/2012  Authorization Visit#:  15 of     HPI:  Symptoms/Limitations Symptoms: Doing well, accompanied by his wife and grandson. He has a cough. Pain Assessment Currently in Pain?: No/denies   Treatment  Apraxia Therapy Aphasia Therapy Patient/Family Education Home Exercise Program  SLP Goals  Home Exercise SLP Goal: Patient will Perform Home Exercise Program: with supervision, verbal cues required/provided SLP Goal: Perform Home Exercise Program - Progress: Progressing toward goal SLP Short Term Goals SLP Short Term Goal 1: Pt will name common objects/pictures with 100% acc via written response and min cues SLP Short Term Goal 1 - Progress: Progressing toward goal SLP Short Term Goal 2: Pt will verbally name common objects/pictures with approximation of word when provided a model 100% of the time and min cues. SLP Short Term Goal 2 - Progress: Progressing toward goal SLP Short Term Goal 3: Pt will complete mod level verbal expression tasks (object description, state function, provide short summary, etc) using short sentences with 90% acc and min assist  SLP Short Term Goal 3 - Progress: Progressing toward goal SLP Short Term Goal 4: Pt will complete reading comprehension tasks (4+ sentence level) with 90% acc and min assist via multiple choice response SLP Short Term Goal 4 - Progress: Progressing toward goal SLP Short Term Goal 5: Pt will be able to write short grocery lists, phone messages with 90% acc and min cues SLP Short Term Goal 5 - Progress: Progressing toward goal SLP Short Term Goal 6: Pt will complete mod level apraxia drills with 90% acc and min assist SLP Short Term Goal 6 - Progress: Progressing toward  goal SLP Long Term Goals SLP Long Term Goal 1: Pt will increase verbal expression skills to [facility] for short phrases with use of compensatory strategies as needed SLP Long Term Goal 1 - Progress: Progressing toward goal SLP Long Term Goal 2: Pt will increase reading comprehension skills to [facility] for short paragraph length material with use of strategies SLP Long Term Goal 2 - Progress: Progressing toward goal  Assessment/Plan  Patient Active Problem List  Diagnosis  . CANDIDIASIS, ORAL  . LYMPHOMA  . MULTIPLE  MYELOMA  . ANXIETY  . HYPERTENSION  . MYOCARDIAL INFARCTION  . CAD, dating to 1996 with multiple PCIs, Stents to RCA and LCX, Last cath 2007 patent stents,30% prox LAD lesion, EF 45%  . ASTHMA, UNSPECIFIED  . NEPHROLITHIASIS  . COUGH  . ELEVATED PROSTATE SPECIFIC ANTIGEN  . Nonspecific (abnormal) findings on radiological and other examination of body structure  . ROTATOR CUFF REPAIR, RIGHT, HX OF  . ABNORMAL LUNG XRAY  . Hypogammaglobulinemia  . Cerebral embolism with cerebral infarction  . Mute  . 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 Hx of bladder cancer  . Hyperlipidemia LDL goal < 100  . Prediabetes  . Hypokalemia  . Expressive aphasia  . Hemiplegia affecting right dominant side   SLP - End of Session Activity Tolerance: Patient tolerated treatment well General Behavior During Session: [facility] for tasks performed Cognition: [facility] for tasks performed  SLP Assessment/Plan Clinical Impression Statement: Mr. Mazzola completed all homework and only needed min assist on one of the reading comprehension questions. He has a cough/wheeze and was excited in therapy because  his grandson was present. He seemed to be speaking faster and therefore was more unintelligibile with multisyllabic words. During item description tasks (barrier task with grandson), he was cued to state the function of the object rather than a  broad statement ("I have one of these"). He continues to make good progress toward goals. His appointment with Dr. Pearlean Brownie is unfortunately not until April, but he is on the cancellation list.  Speech Therapy Frequency: min 2x/week Duration: 4 weeks Treatment/Interventions: Patient/family education;SLP instruction and feedback;Compensatory strategies;Language facilitation Potential to Achieve Goals: Good    PORTER,DABNEY 11/30/2012, 3:31 PM

## 2012-12-01 ENCOUNTER — Ambulatory Visit (INDEPENDENT_AMBULATORY_CARE_PROVIDER_SITE_OTHER): Payer: Medicare Other | Admitting: Internal Medicine

## 2012-12-01 DIAGNOSIS — D485 Neoplasm of uncertain behavior of skin: Secondary | ICD-10-CM | POA: Diagnosis not present

## 2012-12-01 DIAGNOSIS — C44621 Squamous cell carcinoma of skin of unspecified upper limb, including shoulder: Secondary | ICD-10-CM | POA: Diagnosis not present

## 2012-12-01 DIAGNOSIS — C44320 Squamous cell carcinoma of skin of unspecified parts of face: Secondary | ICD-10-CM | POA: Diagnosis not present

## 2012-12-01 DIAGNOSIS — L905 Scar conditions and fibrosis of skin: Secondary | ICD-10-CM | POA: Diagnosis not present

## 2012-12-02 ENCOUNTER — Other Ambulatory Visit (HOSPITAL_COMMUNITY): Payer: Self-pay | Admitting: Oncology

## 2012-12-02 DIAGNOSIS — C9000 Multiple myeloma not having achieved remission: Secondary | ICD-10-CM

## 2012-12-02 MED ORDER — LENALIDOMIDE 10 MG PO CAPS: 10.0000 mg | ORAL_CAPSULE | Freq: Every day | ORAL | Status: DC

## 2012-12-03 ENCOUNTER — Ambulatory Visit (HOSPITAL_COMMUNITY): Payer: Medicare Other | Admitting: Speech Pathology

## 2012-12-03 ENCOUNTER — Telehealth (HOSPITAL_COMMUNITY): Payer: Self-pay | Admitting: *Deleted

## 2012-12-03 ENCOUNTER — Other Ambulatory Visit (HOSPITAL_COMMUNITY): Payer: Self-pay | Admitting: Oncology

## 2012-12-03 DIAGNOSIS — D801 Nonfamilial hypogammaglobulinemia: Secondary | ICD-10-CM

## 2012-12-03 MED ORDER — MOXIFLOXACIN HCL 400 MG PO TABS: 400.0000 mg | ORAL_TABLET | Freq: Every day | ORAL | Status: DC

## 2012-12-03 NOTE — Telephone Encounter (Signed)
Patient started on Septra the other day for cough.  Patient's wife calls today reporting nausea, vomiting, fever of 102F.   I have stopped the Septra in case this is partly an intolerance to this.  I recommended an ER visit, but the patient declined.  I will switch him to Avelox since he has had this in the past and tolerated well.   Patient was educated to report to ED for deterioration.  Patient IVIg in November 2013.  Zaynab Chipman

## 2012-12-03 NOTE — Telephone Encounter (Signed)
Pt's wife called [name] has fever 102 and chills and also nausea this am. Talked with Tom and he instructed pt to stop septra and start avelox. I told his wife that if he worsens to bring him to ED. She will try to get him in here tomorrow am for labs also.

## 2012-12-04 ENCOUNTER — Encounter (HOSPITAL_BASED_OUTPATIENT_CLINIC_OR_DEPARTMENT_OTHER): Payer: Medicare Other

## 2012-12-04 ENCOUNTER — Encounter: Payer: Self-pay | Admitting: Oncology

## 2012-12-04 DIAGNOSIS — C9000 Multiple myeloma not having achieved remission: Secondary | ICD-10-CM

## 2012-12-04 DIAGNOSIS — C8589 Other specified types of non-Hodgkin lymphoma, extranodal and solid organ sites: Secondary | ICD-10-CM | POA: Diagnosis not present

## 2012-12-04 LAB — CBC WITH DIFFERENTIAL/PLATELET
Eosinophils Relative: 0 % (ref 0–5)
HCT: 41.5 % (ref 39.0–52.0)
Hemoglobin: 13.6 g/dL (ref 13.0–17.0)
Lymphocytes Relative: 23 % (ref 12–46)
Lymphs Abs: 0.9 10*3/uL (ref 0.7–4.0)
MCV: 91.2 fL (ref 78.0–100.0)
Monocytes Absolute: 0.5 10*3/uL (ref 0.1–1.0)
Monocytes Relative: 14 % — ABNORMAL HIGH (ref 3–12)
Neutro Abs: 2.5 10*3/uL (ref 1.7–7.7)
RBC: 4.55 MIL/uL (ref 4.22–5.81)
WBC: 4 10*3/uL (ref 4.0–10.5)

## 2012-12-04 LAB — COMPREHENSIVE METABOLIC PANEL
ALT: 16 U/L (ref 0–53)
BUN: 15 mg/dL (ref 6–23)
CO2: 24 mEq/L (ref 19–32)
Calcium: 10.1 mg/dL (ref 8.4–10.5)
Creatinine, Ser: 1.16 mg/dL (ref 0.50–1.35)
GFR calc Af Amer: 72 mL/min — ABNORMAL LOW (ref >90)
GFR calc non Af Amer: 62 mL/min — ABNORMAL LOW (ref >90)
Glucose, Bld: 120 mg/dL — ABNORMAL HIGH (ref 70–99)

## 2012-12-04 LAB — LACTATE DEHYDROGENASE: LDH: 218 U/L (ref 94–250)

## 2012-12-04 NOTE — Progress Notes (Signed)
Labs drawn today for kllc,mm panel,cbc/diff,cmp,ldh

## 2012-12-07 ENCOUNTER — Telehealth (HOSPITAL_COMMUNITY): Payer: Self-pay | Admitting: *Deleted

## 2012-12-07 ENCOUNTER — Ambulatory Visit (HOSPITAL_COMMUNITY): Payer: Medicare Other | Admitting: Speech Pathology

## 2012-12-07 LAB — KAPPA/LAMBDA LIGHT CHAINS: Kappa free light chain: 1.63 mg/dL (ref 0.33–1.94)

## 2012-12-07 NOTE — Telephone Encounter (Signed)
Pt notified that IVIG levels are ok and will hold on infusion for now. Notified to call us with fever or worsening of condition

## 2012-12-07 NOTE — Progress Notes (Signed)
error 

## 2012-12-07 NOTE — Telephone Encounter (Signed)
Pt continues to have cough and feels "washed out". He has not had fever since Saturday evening. Has greenish sputum in the am, clearer as the day goes on. Igg levels should be back this evening or in am.

## 2012-12-08 ENCOUNTER — Ambulatory Visit (HOSPITAL_COMMUNITY): Payer: Medicare Other

## 2012-12-08 LAB — MULTIPLE MYELOMA PANEL, SERUM
Alpha-2-Globulin: 14.2 % — ABNORMAL HIGH (ref 7.1–11.8)
Beta 2: 5.5 % (ref 3.2–6.5)
Beta Globulin: 6.9 % (ref 4.7–7.2)
Gamma Globulin: 10.1 % — ABNORMAL LOW (ref 11.1–18.8)
IgG (Immunoglobin G), Serum: 747 mg/dL (ref 650–1600)
M-Spike, %: NOT DETECTED g/dL

## 2012-12-09 ENCOUNTER — Ambulatory Visit (HOSPITAL_COMMUNITY): Payer: Medicare Other

## 2012-12-10 ENCOUNTER — Other Ambulatory Visit (HOSPITAL_COMMUNITY): Payer: Self-pay | Admitting: *Deleted

## 2012-12-10 ENCOUNTER — Telehealth (HOSPITAL_COMMUNITY): Payer: Self-pay | Admitting: *Deleted

## 2012-12-10 ENCOUNTER — Ambulatory Visit (HOSPITAL_COMMUNITY)
Admission: RE | Admit: 2012-12-10 | Discharge: 2012-12-10 | Disposition: A | Discharge status: Home / Self Care | Payer: Medicare Other | Source: Ambulatory Visit | Attending: Chiropractic Medicine | Admitting: Chiropractic Medicine

## 2012-12-10 NOTE — Progress Notes (Signed)
tussionex called in to [facility] for Patient's c/o cough.

## 2012-12-10 NOTE — Progress Notes (Signed)
Speech Language Pathology Treatment Patient Details  Name: Jeremy Parrish MRN: [id number] Date of Birth: 17-Sep-1941  Today's Date: 12/10/2012 Time: 4098-1191 SLP Time Calculation (min): 43 min  Authorization: medicare  Authorization Time Period: 11/12/2012-12/10/2012  Authorization Visit#:  16 of     HPI:  Symptoms/Limitations Symptoms: "I was sick for a week!" Pain Assessment Currently in Pain?: No/denies  Treatment  Apraxia Therapy Aphasia Therapy Patient/Family Education Home Exercise Program   SLP Goals  Home Exercise SLP Goal: Patient will Perform Home Exercise Program: with supervision, verbal cues required/provided SLP Goal: Perform Home Exercise Program - Progress: Progressing toward goal SLP Short Term Goals SLP Short Term Goal 1: Pt will name common objects/pictures with 100% acc via written response and min cues SLP Short Term Goal 1 - Progress: Met SLP Short Term Goal 2: Pt will verbally name common objects/pictures with approximation of word when provided a model 100% of the time and min cues. SLP Short Term Goal 2 - Progress: Met SLP Short Term Goal 3: Pt will complete mod level verbal expression tasks (object description, state function, provide short summary, etc) using short sentences with 90% acc and min assist  SLP Short Term Goal 3 - Progress: Progressing toward goal SLP Short Term Goal 4: Pt will complete reading comprehension tasks (4+ sentence level) with 90% acc and min assist via multiple choice response SLP Short Term Goal 4 - Progress: Met SLP Short Term Goal 5: Pt will be able to write short grocery lists, phone messages with 90% acc and min cues SLP Short Term Goal 5 - Progress: Partly met SLP Short Term Goal 6: Pt will complete mod level apraxia drills with 90% acc and min assist SLP Short Term Goal 6 - Progress: Met SLP Long Term Goals SLP Long Term Goal 1: Pt will increase verbal expression skills to [facility] for short phrases with use of  compensatory strategies as needed SLP Long Term Goal 1 - Progress: Progressing toward goal SLP Long Term Goal 2: Pt will increase reading comprehension skills to [facility] for short paragraph length material with use of strategies SLP Long Term Goal 2 - Progress: Progressing toward goal  Assessment/Plan  Patient Active Problem List  Diagnosis  . CANDIDIASIS, ORAL  . LYMPHOMA  . MULTIPLE  MYELOMA  . ANXIETY  . HYPERTENSION  . MYOCARDIAL INFARCTION  . CAD, dating to 1996 with multiple PCIs, Stents to RCA and LCX, Last cath 2007 patent stents,30% prox LAD lesion, EF 45%  . ASTHMA, UNSPECIFIED  . NEPHROLITHIASIS  . COUGH  . ELEVATED PROSTATE SPECIFIC ANTIGEN  . Nonspecific (abnormal) findings on radiological and other examination of body structure  . ROTATOR CUFF REPAIR, RIGHT, HX OF  . ABNORMAL LUNG XRAY  . Hypogammaglobulinemia  . Cerebral embolism with cerebral infarction  . Mute  . 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 Hx of bladder cancer  . Hyperlipidemia LDL goal < 100  . Prediabetes  . Hypokalemia  . Expressive aphasia  . Hemiplegia affecting right dominant side   SLP - End of Session Activity Tolerance: Patient tolerated treatment well General Behavior During Session: [facility] for tasks performed Cognition: [facility] for tasks performed  SLP Assessment/Plan Clinical Impression Statement: Mr. Reckart was sick this past week and is finally well enough to come to therapy. He was accompanied by his wife and grandson. Speech was slightly less intelligible today likely due to fatigue from illness. He completed mod/complex naming to description  tasks with 80% acc and min assist. He was able to identify words missing one vowel with 95% acc and min assist. Overall he continues to make great progress in verbal expression goals and would benefit from another 4 weeks of individualized speech therapy to maximize gains. Goals over the  next four weeks will focus on high level word retrieval and descriptive naming tasks as well as written expression. Speech Therapy Frequency: min 2x/week Duration: 4 weeks Treatment/Interventions: Patient/family education;SLP instruction and feedback;Compensatory strategies;Language facilitation Potential to Achieve Goals: Good   Thank you,  Havery Moros, CCC-SLP [phone]  Candis Kabel 12/10/2012, 3:01 PM                                                     Physician Treatment Plan  Your signature is required to indicate approval of the treatment plan/progress as stated above. Please make a copy of this report for your files and return this physician signed original in the self-addressed envelope or fax to [phone]. COMMENTS/CHANGES:__________________________________________________________________________________________________________________________   ____________________________________                      ____________________ Benjamin Stain                                                    DATE

## 2012-12-10 NOTE — Telephone Encounter (Signed)
Patient is requesting something for cough. He still has no fever, one antibiotic left to take.

## 2012-12-14 ENCOUNTER — Other Ambulatory Visit (INDEPENDENT_AMBULATORY_CARE_PROVIDER_SITE_OTHER): Payer: Self-pay | Admitting: *Deleted

## 2012-12-14 ENCOUNTER — Encounter (INDEPENDENT_AMBULATORY_CARE_PROVIDER_SITE_OTHER): Payer: Self-pay | Admitting: Internal Medicine

## 2012-12-14 ENCOUNTER — Telehealth (INDEPENDENT_AMBULATORY_CARE_PROVIDER_SITE_OTHER): Payer: Self-pay | Admitting: *Deleted

## 2012-12-14 ENCOUNTER — Ambulatory Visit (INDEPENDENT_AMBULATORY_CARE_PROVIDER_SITE_OTHER): Payer: Medicare Other | Admitting: Internal Medicine

## 2012-12-14 VITALS — BP 90/58 | HR 64 | Temp 97.9°F | Ht 68.0 in | Wt 188.0 lb

## 2012-12-14 DIAGNOSIS — R197 Diarrhea, unspecified: Secondary | ICD-10-CM | POA: Insufficient documentation

## 2012-12-14 DIAGNOSIS — Z1211 Encounter for screening for malignant neoplasm of colon: Secondary | ICD-10-CM

## 2012-12-14 NOTE — Patient Instructions (Addendum)
Stool studies  Colonoscopy

## 2012-12-14 NOTE — Telephone Encounter (Signed)
Patient needs movi prep 

## 2012-12-14 NOTE — Progress Notes (Addendum)
Subjective:     Patient ID: Jeremy Parrish, male   DOB: 07/22/1941, 72 y.o.   MRN: [id number]  HPI Referred to our office by Dr. Mariel Sleet for diarrhea.  He has had off and on x 3 yrs. He tells me it is caused by the Revamid (one of the side effects). He tells me in 2008 he had 10 inches of his colon removed because of diverticulitis.  He is having anywhere from 0 stools to up to 10 stools a day.  If he takes the Imodium he will have one a day.  When he has the diarrhea, his stools are very loose. There has been no melena or bright red rectal bleeding. Appetite is good. No weight loss. No abdominal pain.  No dysphagia. Occasionally has acid reflux. Recent CVA in November. Recent antibiotics Avelox. He tells me while he was on the Avelox, his stools were not has loose.  Colonoscopy 2005 Screening: A few small diverticula at sigmoid colon. Two small polyps were ablated by cold biopsy.  One at the sigmoid and the other at the ascending colon Biopsy: Polyps: Mildly inflamed, fragments of colonic mucosa. No tumor seen.   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 Marlaine Hind at [facility] 2011.  Hx of non-Hodgkins lymphoma stage 1V, low grade B cell type. Pet scan in June of 2012 showed no evidence of recurrence. 12/18/20123: H and H 13.2 and 35.8, MCV 88.2, Platelet ct 148. Review of Systems see hpi Current Outpatient Prescriptions  Medication Sig Dispense Refill  . amLODipine (NORVASC) 10 MG tablet Take 10 mg by mouth daily.        Marland Kitchen aspirin 81 MG tablet Take 81 mg by mouth daily.        . citalopram (CELEXA) 40 MG tablet Take 40 mg by mouth daily.        . clopidogrel (PLAVIX) 75 MG tablet Take 75 mg by mouth daily.        . Fluticasone-Salmeterol (ADVAIR) 250-50 MCG/DOSE AEPB Inhale 1 puff into the lungs as needed.        . gabapentin (NEURONTIN) 600 MG tablet Take 600-1,200 mg by mouth 4 (four) times  daily. Takes 1 tablet three times a day      . ibuprofen (ADVIL,MOTRIN) 200 MG tablet Take 200 mg by mouth every 4 (four) hours as needed.        Marland Kitchen KLOR-CON M20 20 MEQ tablet TAKE 1 TABLET BY MOUTH TWICE A DAY  60 tablet  3  . lenalidomide (REVLIMID) 10 MG capsule Take 1 capsule (10 mg total) by mouth daily. Take 1 capsule by mouth daily for 7 days on followed by 7 days off  14 capsule  1  . loperamide (IMODIUM A-D) 2 MG tablet Take 2 mg by mouth 4 (four) times daily as needed.      . nitroGLYCERIN (NITROSTAT) 0.4 MG SL tablet Place 0.4 mg under the tongue every 5 (five) minutes as needed. For chest pain      . omeprazole (PRILOSEC) 40 MG capsule Take 40 mg by mouth daily.      Marland Kitchen oxyCODONE-acetaminophen (PERCOCET/ROXICET) 5-325 MG per tablet Take 1 tablet by mouth every 4 (four) hours as needed. For pain      . potassium chloride SA (KLOR-CON M20) 20 MEQ tablet       . simvastatin (ZOCOR) 20 MG tablet Take 20 mg by mouth  at bedtime.        . Zoledronic Acid (ZOMETA) 4 MG/100ML IVPB Inject 4 mg into the vein every 30 (thirty) days.        Marland Kitchen zolpidem (AMBIEN) 5 MG tablet Take 5 mg by mouth at bedtime as needed. For sleep      . chlorpheniramine-HYDROcodone (TUSSIONEX) 10-8 MG/5ML LQCR Take 5 mLs by mouth every 12 (twelve) hours.      . moxifloxacin (AVELOX) 400 MG tablet Take 1 tablet (400 mg total) by mouth daily.  10 tablet  0  . temazepam (RESTORIL) 30 MG capsule Take 1 capsule (30 mg total) by mouth at bedtime as needed for sleep.  30 capsule  1   Past Medical History  Diagnosis Date  . Hypertension   . Heart disease   . Kidney stones     history  . Lung mass   . Heart murmur   . Cancer   . Prostate cancer   . Multiple myeloma(203.0)   . 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 Coronary artery disease   . Hx of bladder cancer 10/07/2012  . Depression   . Shortness of breath   . Stroke 2013   Past Surgical History   Procedure Date  . Prostate surgery   . Heart stents x 5   . Portacath placement   . Wrist surgery     right  . Left ear skin cancer removed   . Bone marrow transplant   . Pacemaker insertion   . Coronary angioplasty   . Insert / replace / remove pacemaker   . Tee without cardioversion 10/13/2012    Procedure: TRANSESOPHAGEAL ECHOCARDIOGRAM (TEE);  Surgeon: Thurmon Fair, MD;  Location: [facility] ENDOSCOPY;  Service: Cardiovascular;  Laterality: N/A;  pat/kay/echo notified   Allergies  Allergen Reactions  . Diphenhydramine Hcl     REACTION: "hyper"  . Morphine And Related Other (See Comments)    hallucinations         Objective:   Physical Exam  Filed Vitals:   12/14/12 0957  BP: 90/58  Pulse: 64  Temp: 97.9 F (36.6 C)  Height: 5\' 8"  (1.727 m)  Weight: 188 lb (85.276 kg)  Alert and oriented. Skin warm and dry. Oral mucosa is moist.   . Sclera anicteric, conjunctivae is pink. Thyroid not enlarged. No cervical lymphadenopathy. Lungs clear. Heart regular rate and rhythm.  Abdomen is soft. Bowel sounds are positive. No hepatomegaly. No abdominal masses felt. No tenderness.  No edema to lower extremities.       Assessment:    Diarrhea, possible chemo induced. C-diff needs to be ruled out. Colonic neoplasm also needs to be ruled out.  Stool studies negative per patient. He thinks his last colonoscopy was greater than 5 yrs ( Dr Karilyn Cota Recent antibiotic use.      Plan:    Colonoscopy. The risks and benefits such as perforation, bleeding, and infection were reviewed with the patient and is agreeable.  C. Difficile and stool studies.

## 2012-12-15 ENCOUNTER — Ambulatory Visit (HOSPITAL_COMMUNITY)
Admission: RE | Admit: 2012-12-15 | Discharge: 2012-12-15 | Disposition: A | Discharge status: Home / Self Care | Payer: Medicare Other | Source: Ambulatory Visit | Attending: Family Medicine | Admitting: Family Medicine

## 2012-12-15 DIAGNOSIS — I69920 Aphasia following unspecified cerebrovascular disease: Secondary | ICD-10-CM | POA: Insufficient documentation

## 2012-12-15 DIAGNOSIS — IMO0001 Reserved for inherently not codable concepts without codable children: Secondary | ICD-10-CM | POA: Insufficient documentation

## 2012-12-15 MED ORDER — PEG-KCL-NACL-NASULF-NA ASC-C 100 G PO SOLR: 1.0000 | Freq: Once | ORAL | Status: DC

## 2012-12-15 NOTE — Progress Notes (Signed)
Speech Language Pathology Treatment Patient Details  Name: Jeremy Parrish MRN: [id number] Date of Birth: 23-Jul-1941  Today's Date: 12/15/2012 Time: 4098-1191 SLP Time Calculation (min): 52 min  Authorization: medicare  Authorization Time Period: 12/14/2012-01/11/13  Authorization Visit#:  17 of     HPI:  Symptoms/Limitations Symptoms: "I'm still not feeling great." Pain Assessment Currently in Pain?: No/denies   Treatment  Aphasia Therapy Apraxia Therapy Patient/Family Education Home Exercise Program  SLP Goals  Home Exercise SLP Goal: Patient will Perform Home Exercise Program: with supervision, verbal cues required/provided SLP Goal: Perform Home Exercise Program - Progress: Progressing toward goal SLP Short Term Goals SLP Short Term Goal 1: Pt will complete high-level word retrieval tasks with 95% acc and min assist. SLP Short Term Goal 1 - Progress: Progressing toward goal SLP Short Term Goal 2: Pt will implement speech intelligibility strategies during conversational speech (decrease rate, self correct errors, self monitoring, checking to see if listener understands) with min assist. SLP Short Term Goal 2 - Progress: Progressing toward goal SLP Short Term Goal 3: Pt will complete high level verbal expression tasks (object description, state function, provide short summary, etc) using short sentences with 90% acc and min assist  SLP Short Term Goal 3 - Progress: Progressing toward goal SLP Short Term Goal 4: Pt will complete reading comprehension tasks (multiple paragraphsl) with 90% acc and min assist via multiple choice response SLP Short Term Goal 4 - Progress: Progressing toward goal SLP Short Term Goal 5: Pt will be able to write short grocery lists, phone messages with 90% acc and min cues SLP Short Term Goal 5 - Progress: Progressing toward goal SLP Long Term Goals SLP Long Term Goal 1: Pt will increase verbal expression skills to [facility] for short phrases with use of  compensatory strategies as needed SLP Long Term Goal 1 - Progress: Progressing toward goal SLP Long Term Goal 2: Pt will increase reading comprehension skills to [facility] for short paragraph length material with use of strategies SLP Long Term Goal 2 - Progress: Progressing toward goal  Assessment/Plan  Patient Active Problem List  Diagnosis  . CANDIDIASIS, ORAL  . LYMPHOMA  . MULTIPLE  MYELOMA  . ANXIETY  . HYPERTENSION  . MYOCARDIAL INFARCTION  . CAD, dating to 1996 with multiple PCIs, Stents to RCA and LCX, Last cath 2007 patent stents,30% prox LAD lesion, EF 45%  . ASTHMA, UNSPECIFIED  . NEPHROLITHIASIS  . COUGH  . ELEVATED PROSTATE SPECIFIC ANTIGEN  . Nonspecific (abnormal) findings on radiological and other examination of body structure  . ROTATOR CUFF REPAIR, RIGHT, HX OF  . ABNORMAL LUNG XRAY  . Hypogammaglobulinemia  . Cerebral embolism with cerebral infarction  . Mute  . 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 Hx of bladder cancer  . Hyperlipidemia LDL goal < 100  . Prediabetes  . Hypokalemia  . Expressive aphasia  . Hemiplegia affecting right dominant side  . Diarrhea   SLP - End of Session Activity Tolerance: Patient tolerated treatment well General Behavior During Session: [facility] for tasks performed Cognition: [facility] for tasks performed  SLP Assessment/Plan Clinical Impression Statement: Jeremy Parrish is still not feeling completely well, but he is doing better. He is also communicating better than last week, however in conversation he needs cues to decrease rate and self monitor for intelligibility. In object description tasks, he had difficult time coming up with single words that are directly related to function/use. Speech Therapy Frequency:  min 2x/week Duration: 4 weeks Treatment/Interventions: Patient/family education;SLP instruction and feedback;Compensatory strategies;Language  facilitation Potential to Achieve Goals: Good    Jeremy Parrish 12/15/2012, 7:05 PM

## 2012-12-17 ENCOUNTER — Ambulatory Visit (HOSPITAL_COMMUNITY)
Admission: RE | Admit: 2012-12-17 | Discharge: 2012-12-17 | Disposition: A | Discharge status: Home / Self Care | Payer: Medicare Other | Source: Ambulatory Visit | Attending: Neurology | Admitting: Neurology

## 2012-12-17 NOTE — Progress Notes (Signed)
Speech Language Pathology Treatment Patient Details  Name: Jeremy Parrish MRN: [id number] Date of Birth: 31-Jan-1941  Today's Date: 12/17/2012 Time: 1030-1123 SLP Time Calculation (min): 53 min  Authorization: medicare  Authorization Time Period: 12/14/2012-01/11/13  Authorization Visit#:  18 of     HPI:  Symptoms/Limitations Symptoms: "I'm not feeling well today." Pain Assessment Currently in Pain?: No/denies   Treatment  Aphasia Therapy Apraxia Therapy Patient/Family Education Home Exercise Program  SLP Goals  Home Exercise SLP Goal: Patient will Perform Home Exercise Program: with supervision, verbal cues required/provided SLP Goal: Perform Home Exercise Program - Progress: Progressing toward goal SLP Short Term Goals SLP Short Term Goal 1: Pt will complete high-level word retrieval tasks with 95% acc and min assist. SLP Short Term Goal 1 - Progress: Progressing toward goal SLP Short Term Goal 2: Pt will implement speech intelligibility strategies during conversational speech (decrease rate, self correct errors, self monitoring, checking to see if listener understands) with min assist. SLP Short Term Goal 2 - Progress: Progressing toward goal SLP Short Term Goal 3: Pt will complete high level verbal expression tasks (object description, state function, provide short summary, etc) using short sentences with 90% acc and min assist  SLP Short Term Goal 3 - Progress: Progressing toward goal SLP Short Term Goal 4: Pt will complete reading comprehension tasks (multiple paragraphsl) with 90% acc and min assist via multiple choice response SLP Short Term Goal 4 - Progress: Progressing toward goal SLP Short Term Goal 5: Pt will be able to write short grocery lists, phone messages with 90% acc and min cues SLP Short Term Goal 5 - Progress: Progressing toward goal SLP Long Term Goals SLP Long Term Goal 1: Pt will increase verbal expression skills to [facility] for short phrases with use of  compensatory strategies as needed SLP Long Term Goal 1 - Progress: Progressing toward goal SLP Long Term Goal 2: Pt will increase reading comprehension skills to [facility] for short paragraph length material with use of strategies SLP Long Term Goal 2 - Progress: Progressing toward goal  Assessment/Plan  Patient Active Problem List  Diagnosis  . CANDIDIASIS, ORAL  . LYMPHOMA  . MULTIPLE  MYELOMA  . ANXIETY  . HYPERTENSION  . MYOCARDIAL INFARCTION  . CAD, dating to 1996 with multiple PCIs, Stents to RCA and LCX, Last cath 2007 patent stents,30% prox LAD lesion, EF 45%  . ASTHMA, UNSPECIFIED  . NEPHROLITHIASIS  . COUGH  . ELEVATED PROSTATE SPECIFIC ANTIGEN  . Nonspecific (abnormal) findings on radiological and other examination of body structure  . ROTATOR CUFF REPAIR, RIGHT, HX OF  . ABNORMAL LUNG XRAY  . Hypogammaglobulinemia  . Cerebral embolism with cerebral infarction  . Mute  . 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 Hx of bladder cancer  . Hyperlipidemia LDL goal < 100  . Prediabetes  . Hypokalemia  . Expressive aphasia  . Hemiplegia affecting right dominant side  . Diarrhea   SLP - End of Session Activity Tolerance: Patient tolerated treatment well General Behavior During Session: [facility] for tasks performed Cognition: [facility] for tasks performed  SLP Assessment/Plan Clinical Impression Statement: Jeremy Parrish is still short of breath from recent respiratory illness and finds he gets winded when talking. He is aware that he needs to decrease rate and he feels the tension in his larynx and jaw. We discussed strategies for increased fluency (light articulatory touch, controlled breathing, replenishing breath, and not speaking while running out of  air). He was able to complete abdominal breathing with clincian cue of hand on belly. When he gets stuck on a sound (not unlike stuttering), he was cued to drop his jaw/ease  tension rather than plow through it. Speech Therapy Frequency: min 2x/week Duration: 4 weeks Treatment/Interventions: Patient/family education;SLP instruction and feedback;Compensatory strategies;Language facilitation Potential to Achieve Goals: Good     PORTER,DABNEY 12/17/2012, 12:18 PM

## 2012-12-18 ENCOUNTER — Encounter (HOSPITAL_COMMUNITY): Payer: Self-pay | Admitting: Pharmacy Technician

## 2012-12-20 LAB — STOOL CULTURE

## 2012-12-21 ENCOUNTER — Ambulatory Visit (HOSPITAL_COMMUNITY)
Admission: RE | Admit: 2012-12-21 | Discharge: 2012-12-21 | Disposition: A | Discharge status: Home / Self Care | Payer: Medicare Other | Source: Ambulatory Visit | Attending: Neurology | Admitting: Neurology

## 2012-12-21 NOTE — Progress Notes (Signed)
Speech Language Pathology Treatment Patient Details  Name: MAHAMED ZALEWSKI MRN: [id number] Date of Birth: 09/12/1941  Today's Date: 12/21/2012 Time: 1300-1356 SLP Time Calculation (min): 56 min  Authorization: medicare  Authorization Time Period: 12/14/2012-01/11/13  Authorization Visit#:  19 of     HPI:  Symptoms/Limitations Symptoms: Doing well, accompanied by his wife, still has a cough Pain Assessment Currently in Pain?: No/denies   Treatment  Aphasia Therapy Patient/Family Education Home Exercise Program  SLP Goals  Home Exercise SLP Goal: Patient will Perform Home Exercise Program: with supervision, verbal cues required/provided SLP Goal: Perform Home Exercise Program - Progress: Progressing toward goal SLP Short Term Goals SLP Short Term Goal 1: Pt will complete high-level word retrieval tasks with 95% acc and min assist. SLP Short Term Goal 1 - Progress: Progressing toward goal SLP Short Term Goal 2: Pt will implement speech intelligibility strategies during conversational speech (decrease rate, self correct errors, self monitoring, checking to see if listener understands) with min assist. SLP Short Term Goal 2 - Progress: Progressing toward goal SLP Short Term Goal 3: Pt will complete high level verbal expression tasks (object description, state function, provide short summary, etc) using short sentences with 90% acc and min assist  SLP Short Term Goal 3 - Progress: Progressing toward goal SLP Short Term Goal 4: Pt will complete reading comprehension tasks (multiple paragraphsl) with 90% acc and min assist via multiple choice response SLP Short Term Goal 4 - Progress: Progressing toward goal SLP Short Term Goal 5: Pt will be able to write short grocery lists, phone messages with 90% acc and min cues SLP Short Term Goal 5 - Progress: Progressing toward goal SLP Long Term Goals SLP Long Term Goal 1: Pt will increase verbal expression skills to [facility] for short phrases with use  of compensatory strategies as needed SLP Long Term Goal 1 - Progress: Progressing toward goal SLP Long Term Goal 2: Pt will increase reading comprehension skills to [facility] for short paragraph length material with use of strategies SLP Long Term Goal 2 - Progress: Progressing toward goal  Assessment/Plan  Patient Active Problem List  Diagnosis  . CANDIDIASIS, ORAL  . LYMPHOMA  . MULTIPLE  MYELOMA  . ANXIETY  . HYPERTENSION  . MYOCARDIAL INFARCTION  . CAD, dating to 1996 with multiple PCIs, Stents to RCA and LCX, Last cath 2007 patent stents,30% prox LAD lesion, EF 45%  . ASTHMA, UNSPECIFIED  . NEPHROLITHIASIS  . COUGH  . ELEVATED PROSTATE SPECIFIC ANTIGEN  . Nonspecific (abnormal) findings on radiological and other examination of body structure  . ROTATOR CUFF REPAIR, RIGHT, HX OF  . ABNORMAL LUNG XRAY  . Hypogammaglobulinemia  . Cerebral embolism with cerebral infarction  . Mute  . 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 Hx of bladder cancer  . Hyperlipidemia LDL goal < 100  . Prediabetes  . Hypokalemia  . Expressive aphasia  . Hemiplegia affecting right dominant side  . Diarrhea   SLP - End of Session Activity Tolerance: Patient tolerated treatment well General Behavior During Session: [facility] for tasks performed Cognition: [facility] for tasks performed  SLP Assessment/Plan Clinical Impression Statement: Mr. Vantol acknowledges the importance of decreasing rate to increase intelligibility and fluency. He cues himself by squeezing his hands together. He continues to have difficulty with multisyllabic words (4+ syllables) and words with "s, r, l" letters. He was given sheet of multisyllabic words to practice at home as well as something to  practice writing. Speech Therapy Frequency: min 2x/week Duration: Other (comment) (3 weeks) Treatment/Interventions: Patient/family education;SLP instruction and feedback;Compensatory  strategies;Language facilitation Potential to Achieve Goals: Good  GN Functional Limitations: Spoken language expressive Spoken Language Expression Current Status [phone]): At least 20 percent but less than 40 percent impaired, limited or restricted Spoken Language Expression Goal Status [id number]): At least 1 percent but less than 20 percent impaired, limited or restricted  PORTER,DABNEY 12/21/2012, 2:10 PM

## 2012-12-22 ENCOUNTER — Ambulatory Visit (HOSPITAL_BASED_OUTPATIENT_CLINIC_OR_DEPARTMENT_OTHER): Payer: Medicare Other | Admitting: Oncology

## 2012-12-22 ENCOUNTER — Encounter (HOSPITAL_COMMUNITY): Payer: Medicare Other | Attending: Oncology

## 2012-12-22 ENCOUNTER — Telehealth (INDEPENDENT_AMBULATORY_CARE_PROVIDER_SITE_OTHER): Payer: Self-pay | Admitting: Internal Medicine

## 2012-12-22 VITALS — BP 127/70 | HR 63 | Temp 100.1°F | Resp 20

## 2012-12-22 DIAGNOSIS — R05 Cough: Secondary | ICD-10-CM

## 2012-12-22 DIAGNOSIS — R059 Cough, unspecified: Secondary | ICD-10-CM | POA: Diagnosis not present

## 2012-12-22 DIAGNOSIS — J180 Bronchopneumonia, unspecified organism: Secondary | ICD-10-CM

## 2012-12-22 DIAGNOSIS — R509 Fever, unspecified: Secondary | ICD-10-CM | POA: Diagnosis not present

## 2012-12-22 DIAGNOSIS — C9000 Multiple myeloma not having achieved remission: Secondary | ICD-10-CM | POA: Diagnosis not present

## 2012-12-22 DIAGNOSIS — C44320 Squamous cell carcinoma of skin of unspecified parts of face: Secondary | ICD-10-CM | POA: Diagnosis not present

## 2012-12-22 DIAGNOSIS — C8589 Other specified types of non-Hodgkin lymphoma, extranodal and solid organ sites: Secondary | ICD-10-CM | POA: Insufficient documentation

## 2012-12-22 DIAGNOSIS — D801 Nonfamilial hypogammaglobulinemia: Secondary | ICD-10-CM | POA: Diagnosis not present

## 2012-12-22 MED ORDER — SODIUM CHLORIDE 0.9 % IV SOLN
Freq: Once | INTRAVENOUS | Status: AC
  Administered 2012-12-22: 14:00:00 via INTRAVENOUS

## 2012-12-22 MED ORDER — HEPARIN SOD (PORK) LOCK FLUSH 100 UNIT/ML IV SOLN
INTRAVENOUS | Status: AC
  Filled 2012-12-22: qty 5

## 2012-12-22 MED ORDER — ZOLEDRONIC ACID 4 MG/5ML IV CONC
4.0000 mg | Freq: Once | INTRAVENOUS | Status: AC
  Administered 2012-12-22: 4 mg via INTRAVENOUS
  Filled 2012-12-22: qty 5

## 2012-12-22 MED ORDER — IMMUNE GLOBULIN (HUMAN) 20 GM/200ML IV SOLN
75.0000 g | Freq: Once | INTRAVENOUS | Status: DC
  Filled 2012-12-22: qty 750

## 2012-12-22 MED ORDER — IMMUNE GLOBULIN (HUMAN) 5 GM/100ML IV SOLN: 1.0000 g/kg | Freq: Once | INTRAVENOUS | Status: DC

## 2012-12-22 MED ORDER — HEPARIN SOD (PORK) LOCK FLUSH 100 UNIT/ML IV SOLN
500.0000 [IU] | Freq: Once | INTRAVENOUS | Status: AC | PRN
  Administered 2012-12-22: 500 [IU]
  Filled 2012-12-22: qty 5

## 2012-12-22 MED ORDER — SODIUM CHLORIDE 0.9 % IJ SOLN
10.0000 mL | INTRAMUSCULAR | Status: DC | PRN
  Administered 2012-12-22: 10 mL
  Filled 2012-12-22: qty 10

## 2012-12-22 MED ORDER — AMOXICILLIN-POT CLAVULANATE 875-125 MG PO TABS: 1.0000 | ORAL_TABLET | Freq: Two times a day (BID) | ORAL | Status: DC

## 2012-12-22 MED ORDER — IMMUNE GLOBULIN (HUMAN) 10 GM/100ML IV SOLN
10.0000 g | Freq: Once | INTRAVENOUS | Status: DC
  Filled 2012-12-22: qty 100

## 2012-12-22 NOTE — Addendum Note (Signed)
Addended by: Edythe Lynn A on: 12/22/2012 03:34 PM   Modules accepted: Orders

## 2012-12-22 NOTE — Telephone Encounter (Signed)
Results given to patient. Scheduled for a colonoscopy 

## 2012-12-22 NOTE — Progress Notes (Signed)
Discussed patient's continued cough and low grade fever with Dr. Mariel Sleet. Patient examined per Dr. Mariel Sleet and orders received. Pt tolerated infusion well.

## 2012-12-22 NOTE — Progress Notes (Signed)
Number 1 probable bronchopneumonia with productive cough discolored yellow phlegm, low-grade fever today, coughing to the point that he cannot sleep during the night. He just finished his Avelox antibiotic one week ago and is already getting worse. #2 hypogammaglobulinemia #3 IgG lambda multiple myeloma status post 4 cycles of dexamethasone and Velcade followed by bone marrow transplantation by Dr. Greggory Stallion at [facility] October 2011 #4 non-Hodgkin's lymphoma, stage IVb, low-grade B-cell type, symptomatic at presentation in July 2002 to 6 cycles of R. 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 Park Liter #9 recent CVA  He is a work in today because of the cough and yellow phlegm and fever. He just does not feel well. He looks chronically ill and has a bandage over his left lower face where he just had a skin cancer removed. His lungs show rales at both lung bases medially. He is wheezing bilaterally. He has rhonchi bilaterally. He is not in acute distress but looks chronically ill. Heart shows a regular rhythm and rate right around 96.  We will give him IVIG and start Augmentin. He will receive the IVIG tomorrow and Friday. Thursday  is expected to be bad weather.

## 2012-12-23 ENCOUNTER — Encounter (HOSPITAL_BASED_OUTPATIENT_CLINIC_OR_DEPARTMENT_OTHER): Payer: Medicare Other

## 2012-12-23 ENCOUNTER — Encounter (HOSPITAL_COMMUNITY): Payer: Self-pay

## 2012-12-23 VITALS — BP 117/57 | HR 66 | Temp 98.8°F | Resp 18 | Wt 187.8 lb

## 2012-12-23 DIAGNOSIS — D801 Nonfamilial hypogammaglobulinemia: Secondary | ICD-10-CM | POA: Diagnosis not present

## 2012-12-23 LAB — IGG, IGA, IGM
IgA: 214 mg/dL (ref 68–379)
IgG (Immunoglobin G), Serum: 637 mg/dL — ABNORMAL LOW (ref 650–1600)

## 2012-12-23 MED ORDER — DEXTROSE 5 % IV SOLN
INTRAVENOUS | Status: DC
  Administered 2012-12-23: 09:00:00 via INTRAVENOUS

## 2012-12-23 MED ORDER — IMMUNE GLOBULIN (HUMAN) 10 GM/100ML IV SOLN
1.0000 g/kg | Freq: Once | INTRAVENOUS | Status: AC
  Administered 2012-12-23: 85 g via INTRAVENOUS
  Filled 2012-12-23: qty 850

## 2012-12-23 MED ORDER — HEPARIN SOD (PORK) LOCK FLUSH 100 UNIT/ML IV SOLN
INTRAVENOUS | Status: AC
  Filled 2012-12-23: qty 5

## 2012-12-23 NOTE — Progress Notes (Signed)
Tolerated well

## 2012-12-24 ENCOUNTER — Ambulatory Visit (HOSPITAL_COMMUNITY): Payer: Medicare Other | Admitting: Speech Pathology

## 2012-12-25 ENCOUNTER — Other Ambulatory Visit (HOSPITAL_COMMUNITY): Payer: Self-pay | Admitting: Oncology

## 2012-12-25 ENCOUNTER — Encounter (HOSPITAL_BASED_OUTPATIENT_CLINIC_OR_DEPARTMENT_OTHER): Payer: Medicare Other

## 2012-12-25 VITALS — BP 137/72 | HR 66 | Temp 97.4°F | Resp 18 | Wt 187.4 lb

## 2012-12-25 DIAGNOSIS — C9000 Multiple myeloma not having achieved remission: Secondary | ICD-10-CM

## 2012-12-25 DIAGNOSIS — D801 Nonfamilial hypogammaglobulinemia: Secondary | ICD-10-CM

## 2012-12-25 DIAGNOSIS — R05 Cough: Secondary | ICD-10-CM

## 2012-12-25 DIAGNOSIS — C8589 Other specified types of non-Hodgkin lymphoma, extranodal and solid organ sites: Secondary | ICD-10-CM

## 2012-12-25 DIAGNOSIS — R059 Cough, unspecified: Secondary | ICD-10-CM

## 2012-12-25 MED ORDER — IMMUNE GLOBULIN (HUMAN) 10 GM/100ML IV SOLN
1.0000 g/kg | Freq: Once | INTRAVENOUS | Status: AC
  Administered 2012-12-25: 85 g via INTRAVENOUS
  Filled 2012-12-25: qty 850

## 2012-12-25 MED ORDER — DEXTROSE 5 % IV SOLN
INTRAVENOUS | Status: DC
  Administered 2012-12-25: 09:00:00 via INTRAVENOUS

## 2012-12-25 MED ORDER — HEPARIN SOD (PORK) LOCK FLUSH 100 UNIT/ML IV SOLN
INTRAVENOUS | Status: AC
  Filled 2012-12-25: qty 5

## 2012-12-25 MED ORDER — OXYCODONE-ACETAMINOPHEN 5-325 MG PO TABS: 1.0000 | ORAL_TABLET | ORAL | Status: DC | PRN

## 2012-12-25 MED ORDER — LENALIDOMIDE 10 MG PO CAPS: 10.0000 mg | ORAL_CAPSULE | Freq: Every day | ORAL | Status: DC

## 2012-12-25 NOTE — Progress Notes (Signed)
Is somewhat SOB with exertion.  Also has frequent dry cough.  Tolerated IVIG well.

## 2012-12-28 ENCOUNTER — Ambulatory Visit (HOSPITAL_COMMUNITY)
Admission: RE | Admit: 2012-12-28 | Discharge: 2012-12-28 | Disposition: A | Discharge status: Home / Self Care | Payer: Medicare Other | Source: Ambulatory Visit | Attending: Neurology | Admitting: Neurology

## 2012-12-28 NOTE — Progress Notes (Signed)
Speech Language Pathology Treatment Patient Details  Name: HARACE MCCLUNEY MRN: [id number] Date of Birth: Oct 17, 1941  Today's Date: 12/28/2012 Time: 1115-1200 SLP Time Calculation (min): 45 min  Authorization: medicare  Authorization Time Period: 12/14/2012-01/11/13  Authorization Visit#:  20 of     HPI:  Symptoms/Limitations Symptoms: Doing well, feeling a little bit better. Pain Assessment Currently in Pain?: No/denies   Treatment  Aphasia Therapy Apraxia Therapy Patient/Family Education Home Exercise Program  SLP Goals  Home Exercise SLP Goal: Patient will Perform Home Exercise Program: with supervision, verbal cues required/provided SLP Goal: Perform Home Exercise Program - Progress: Progressing toward goal SLP Short Term Goals SLP Short Term Goal 1: Pt will complete high-level word retrieval tasks with 95% acc and min assist. SLP Short Term Goal 1 - Progress: Progressing toward goal SLP Short Term Goal 2: Pt will implement speech intelligibility strategies during conversational speech (decrease rate, self correct errors, self monitoring, checking to see if listener understands) with min assist. SLP Short Term Goal 2 - Progress: Progressing toward goal SLP Short Term Goal 3: Pt will complete high level verbal expression tasks (object description, state function, provide short summary, etc) using short sentences with 90% acc and min assist  SLP Short Term Goal 3 - Progress: Progressing toward goal SLP Short Term Goal 4: Pt will complete reading comprehension tasks (multiple paragraphsl) with 90% acc and min assist via multiple choice response SLP Short Term Goal 4 - Progress: Progressing toward goal SLP Short Term Goal 5: Pt will be able to write short grocery lists, phone messages with 90% acc and min cues SLP Short Term Goal 5 - Progress: Progressing toward goal SLP Long Term Goals SLP Long Term Goal 1: Pt will increase verbal expression skills to [facility] for short phrases with  use of compensatory strategies as needed SLP Long Term Goal 1 - Progress: Progressing toward goal SLP Long Term Goal 2: Pt will increase reading comprehension skills to [facility] for short paragraph length material with use of strategies SLP Long Term Goal 2 - Progress: Progressing toward goal  Assessment/Plan  Patient Active Problem List  Diagnosis  . CANDIDIASIS, ORAL  . LYMPHOMA  . MULTIPLE  MYELOMA  . ANXIETY  . HYPERTENSION  . MYOCARDIAL INFARCTION  . CAD, dating to 1996 with multiple PCIs, Stents to RCA and LCX, Last cath 2007 patent stents,30% prox LAD lesion, EF 45%  . ASTHMA, UNSPECIFIED  . NEPHROLITHIASIS  . COUGH  . ELEVATED PROSTATE SPECIFIC ANTIGEN  . Nonspecific (abnormal) findings on radiological and other examination of body structure  . ROTATOR CUFF REPAIR, RIGHT, HX OF  . ABNORMAL LUNG XRAY  . Hypogammaglobulinemia  . Cerebral embolism with cerebral infarction  . Mute  . 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 Hx of bladder cancer  . Hyperlipidemia LDL goal < 100  . Prediabetes  . Hypokalemia  . Expressive aphasia  . Hemiplegia affecting right dominant side  . Diarrhea   SLP - End of Session Activity Tolerance: Patient tolerated treatment well General Behavior During Session: [facility] for tasks performed Cognition: [facility] for tasks performed  SLP Assessment/Plan Clinical Impression Statement: Mr. Grine is feeling a littel bit better, but not 100%. Today, I recorded him reading short paragraphs aloud and he was surprised how well he sounded. I was able to point out error patterns and strategies to improve fluency. At times, he leave off ending sounds (ed, s, ch) and jumps ahead to next  word before saying the word in front. I emphasized accuracy over speed for now and gave him sheets to practice at home. Speech Therapy Frequency: min 2x/week Duration: 2 weeks (3 weeks) Treatment/Interventions:  Patient/family education;SLP instruction and feedback;Compensatory strategies;Language facilitation Potential to Achieve Goals: Good     PORTER,DABNEY 12/28/2012, 12:16 PM

## 2012-12-29 DIAGNOSIS — R3129 Other microscopic hematuria: Secondary | ICD-10-CM | POA: Diagnosis not present

## 2012-12-31 ENCOUNTER — Encounter (INDEPENDENT_AMBULATORY_CARE_PROVIDER_SITE_OTHER): Payer: Self-pay

## 2012-12-31 ENCOUNTER — Ambulatory Visit (HOSPITAL_COMMUNITY): Payer: Medicare Other | Admitting: Speech Pathology

## 2012-12-31 MED ORDER — SODIUM CHLORIDE 0.45 % IV SOLN
INTRAVENOUS | Status: DC
  Administered 2013-01-01: 10:00:00 via INTRAVENOUS

## 2013-01-01 ENCOUNTER — Encounter (HOSPITAL_COMMUNITY)
Admission: RE | Disposition: A | Discharge status: Home / Self Care | Payer: Self-pay | Source: Ambulatory Visit | Attending: Internal Medicine

## 2013-01-01 ENCOUNTER — Ambulatory Visit (HOSPITAL_COMMUNITY)
Admission: RE | Admit: 2013-01-01 | Discharge: 2013-01-01 | Disposition: A | Discharge status: Home / Self Care | Payer: Medicare Other | Source: Ambulatory Visit | Attending: Internal Medicine | Admitting: Internal Medicine

## 2013-01-01 ENCOUNTER — Encounter (HOSPITAL_COMMUNITY): Payer: Self-pay | Admitting: *Deleted

## 2013-01-01 DIAGNOSIS — K644 Residual hemorrhoidal skin tags: Secondary | ICD-10-CM | POA: Diagnosis not present

## 2013-01-01 DIAGNOSIS — I10 Essential (primary) hypertension: Secondary | ICD-10-CM | POA: Diagnosis not present

## 2013-01-01 DIAGNOSIS — D126 Benign neoplasm of colon, unspecified: Secondary | ICD-10-CM | POA: Diagnosis not present

## 2013-01-01 DIAGNOSIS — K5730 Diverticulosis of large intestine without perforation or abscess without bleeding: Secondary | ICD-10-CM | POA: Insufficient documentation

## 2013-01-01 DIAGNOSIS — R197 Diarrhea, unspecified: Secondary | ICD-10-CM

## 2013-01-01 HISTORY — PX: COLONOSCOPY: SHX5424

## 2013-01-01 SURGERY — COLONOSCOPY
Anesthesia: Moderate Sedation

## 2013-01-01 MED ORDER — MIDAZOLAM HCL 5 MG/5ML IJ SOLN
INTRAMUSCULAR | Status: DC | PRN
  Administered 2013-01-01 (×2): 1 mg via INTRAVENOUS
  Administered 2013-01-01: 2 mg via INTRAVENOUS

## 2013-01-01 MED ORDER — STERILE WATER FOR IRRIGATION IR SOLN
Status: DC | PRN
  Administered 2013-01-01 (×2)

## 2013-01-01 MED ORDER — MEPERIDINE HCL 50 MG/ML IJ SOLN
INTRAMUSCULAR | Status: AC
  Filled 2013-01-01: qty 1

## 2013-01-01 MED ORDER — MIDAZOLAM HCL 5 MG/5ML IJ SOLN
INTRAMUSCULAR | Status: AC
  Filled 2013-01-01: qty 10

## 2013-01-01 MED ORDER — MEPERIDINE HCL 50 MG/ML IJ SOLN
INTRAMUSCULAR | Status: DC | PRN
  Administered 2013-01-01 (×2): 25 mg via INTRAVENOUS

## 2013-01-01 NOTE — H&P (Signed)
Jeremy Parrish is an 72 y.o. male.   Chief Complaint: Patient is here for colonoscopy. HPI: Patient is 72 year old Caucasian male who has chronic diarrhea. Rule studies been negative. He has good appetite and has not lost any weight. When he takes morning and he gets constipated. He denies melena or rectal bleeding. He is  not sure cardia started when he was begun on Revlimid. Since last colonoscopy was in 2005. He had 2 small polyps removed which were nonadenomatous. Family history is negative for colorectal carcinoma.  Past Medical History  Diagnosis Date  . Hypertension   . Heart disease   . Kidney stones     history  . Lung mass   . Heart murmur   . 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 Coronary artery disease   . Depression   . Shortness of breath   . Stroke 2013  . Cancer   . Prostate cancer 2000  . Multiple myeloma(203.0)   . Hx of bladder cancer 10/07/2012    Past Surgical History  Procedure Laterality Date  . Prostate surgery    . Heart stents x 5    . Portacath placement    . Wrist surgery      right  . Left ear skin cancer removed    . Bone marrow transplant    . Pacemaker insertion    . Coronary angioplasty    . Insert / replace / remove pacemaker    . Tee without cardioversion  10/13/2012    Procedure: TRANSESOPHAGEAL ECHOCARDIOGRAM (TEE);  Surgeon: Thurmon Fair, MD;  Location: [facility] ENDOSCOPY;  Service: Cardiovascular;  Laterality: N/A;  pat/kay/echo notified    Family History  Problem Relation Age of Onset  . Colon cancer Neg Hx   . Colon polyps Neg Hx    Social History:  reports that he quit smoking about 18 years ago. His smoking use included Cigarettes. He has a 20 pack-year smoking history. He has never used smokeless tobacco. He reports that he does not drink alcohol or use illicit drugs.  Allergies:  Allergies  Allergen Reactions  . Diphenhydramine Hcl     REACTION: "hyper"  .  Morphine And Related Other (See Comments)    hallucinations    Medications Prior to Admission  Medication Sig Dispense Refill  . amLODipine (NORVASC) 10 MG tablet Take 10 mg by mouth daily.        Marland Kitchen aspirin 81 MG tablet Take 81 mg by mouth daily.        . chlorpheniramine-HYDROcodone (TUSSIONEX) 10-8 MG/5ML LQCR Take 5 mLs by mouth every 12 (twelve) hours.      . citalopram (CELEXA) 40 MG tablet Take 40 mg by mouth daily.        . clopidogrel (PLAVIX) 75 MG tablet Take 75 mg by mouth daily.        . Fluticasone-Salmeterol (ADVAIR) 250-50 MCG/DOSE AEPB Inhale 1 puff into the lungs as needed. Wheezing/shortness of breath.      . gabapentin (NEURONTIN) 600 MG tablet Take 600-1,200 mg by mouth 4 (four) times daily. Takes 1 tablet three times a day      . ibuprofen (ADVIL,MOTRIN) 200 MG tablet Take 200 mg by mouth every 4 (four) hours as needed. Pain.      Marland Kitchen KLOR-CON M20 20 MEQ tablet TAKE 1 TABLET BY MOUTH TWICE A DAY  60 tablet  3  . lenalidomide (REVLIMID) 10 MG capsule Take 1 capsule (  10 mg total) by mouth daily. Take 1 capsule by mouth daily for 7 days on followed by 7 days off  14 capsule  1  . loperamide (IMODIUM A-D) 2 MG tablet Take 2 mg by mouth 4 (four) times daily as needed. Diarrhea.      . moxifloxacin (AVELOX) 400 MG tablet Take 1 tablet (400 mg total) by mouth daily.  10 tablet  0  . omeprazole (PRILOSEC) 40 MG capsule Take 40 mg by mouth daily.      . peg 3350 powder (MOVIPREP) 100 G SOLR Take 1 kit (100 g total) by mouth once.  1 kit  0  . Zoledronic Acid (ZOMETA) 4 MG/100ML IVPB Inject 4 mg into the vein every 30 (thirty) days.        Marland Kitchen zolpidem (AMBIEN) 5 MG tablet Take 5 mg by mouth at bedtime as needed. For sleep      . amoxicillin-clavulanate (AUGMENTIN) 875-125 MG per tablet Take 1 tablet by mouth 2 (two) times daily.  20 tablet  0  . nitroGLYCERIN (NITROSTAT) 0.4 MG SL tablet Place 0.4 mg under the tongue every 5 (five) minutes as needed. For chest pain      .  oxyCODONE-acetaminophen (PERCOCET/ROXICET) 5-325 MG per tablet Take 1 tablet by mouth every 4 (four) hours as needed. For pain  60 tablet  0  . simvastatin (ZOCOR) 20 MG tablet Take 20 mg by mouth at bedtime.        . temazepam (RESTORIL) 30 MG capsule Take 1 capsule (30 mg total) by mouth at bedtime as needed for sleep.  30 capsule  1    No results found for this or any previous visit (from the past 48 hour(s)). No results found.  ROS  Blood pressure 143/89, pulse 64, temperature 97.5 F (36.4 C), temperature source Oral, resp. rate 27, height 5\' 8"  (1.727 m), weight 190 lb (86.183 kg), SpO2 97.00%. Physical Exam  Constitutional: He appears well-developed and well-nourished.  HENT:  Mouth/Throat: Oropharynx is clear and moist.  Eyes: Conjunctivae are normal. No scleral icterus.  Neck: No thyromegaly present.  Cardiovascular: Normal rate, regular rhythm and normal heart sounds.   No murmur heard. Respiratory: Effort normal and breath sounds normal.  GI: He exhibits no distension and no mass. There is no tenderness.  Musculoskeletal: He exhibits no edema.  Lymphadenopathy:    He has no cervical adenopathy.  Neurological: He is alert.  Skin: Skin is warm and dry.     Assessment/Plan Chronic diarrhea negative stool studies. Diagnostic colonoscopy.  Jeremy Parrish U 01/01/2013, 10:10 AM

## 2013-01-01 NOTE — Op Note (Signed)
COLONOSCOPY PROCEDURE REPORT  PATIENT:  Jeremy Parrish  MR#:  [id number] Birthdate:  26-Apr-1941, 72 y.o., male Endoscopist:  Dr. Malissa Hippo, MD Referred By:  Dr. Glenford Peers, MD Procedure Date: 01/01/2013  Procedure:   Colonoscopy with snare polypectomy.  Indications: Patient is 72 year old Caucasian male with multiple medical problems who presents with chronic diarrhea. He has history of complicated sigmoid diverticulitis for which he had surgery in December 2007.  Informed Consent:  The procedure and risks were reviewed with the patient and informed consent was obtained.  Medications:  Demerol 50 mg IV Versed 4 mg IV  Description of procedure:  After a digital rectal exam was performed, that colonoscope was advanced from the anus through the rectum and colon to the area of the cecum, ileocecal valve and appendiceal orifice. The cecum was deeply intubated. These structures were well-seen and photographed for the record. From the level of the cecum and ileocecal valve, the scope was slowly and cautiously withdrawn. The mucosal surfaces were carefully surveyed utilizing scope tip to flexion to facilitate fold flattening as needed. The scope was pulled down into the rectum where a thorough exam including retroflexion was performed.  Findings:   Prep satisfactory. Three polyp snared from cecum measuring 5-7 mm. These polyps were submitted together. Two small polyps coagulated using snare tip. Is her located at ascending and descending colon. Angle diverticula meds cecum and few small ones at descending colon. Wide-open colonic anastomosis at 20 cm from anal margin. Normal rectal mucosa and small hemorrhoids below the dentate line.  Therapeutic/Diagnostic Maneuvers Performed:  See above  Complications:  None  Cecal Withdrawal Time:  27  minutes  Impression:  Examination performed to cecum. Three small polyps snared from cecum and submitted together. Two small polyps were  coagulated as above. Single diverticulum at cecum with few more at descending colon. Open colonic anastomosis at 20 cm from the anal margin. No evidence of endoscopic colitis.  Recommendations:  Standard instructions given. Imodium OTC 2 mg by mouth every morning. Metamucil 3-4 g by mouth daily. I will contact patient with biopsy results and further recommendations.  Anjuli Gemmill U  01/01/2013 10:57 AM  CC: Dr. Lilyan Punt, MD & Dr. Bonnetta Barry ref. provider found

## 2013-01-04 ENCOUNTER — Other Ambulatory Visit (HOSPITAL_COMMUNITY): Payer: Self-pay | Admitting: Oncology

## 2013-01-04 ENCOUNTER — Ambulatory Visit (HOSPITAL_COMMUNITY)
Admission: RE | Admit: 2013-01-04 | Discharge: 2013-01-04 | Disposition: A | Discharge status: Home / Self Care | Payer: Medicare Other | Source: Ambulatory Visit | Attending: Neurology | Admitting: Neurology

## 2013-01-04 ENCOUNTER — Encounter (HOSPITAL_COMMUNITY): Payer: Self-pay | Admitting: Internal Medicine

## 2013-01-04 DIAGNOSIS — C8589 Other specified types of non-Hodgkin lymphoma, extranodal and solid organ sites: Secondary | ICD-10-CM

## 2013-01-04 DIAGNOSIS — R05 Cough: Secondary | ICD-10-CM

## 2013-01-04 DIAGNOSIS — R059 Cough, unspecified: Secondary | ICD-10-CM

## 2013-01-04 DIAGNOSIS — D801 Nonfamilial hypogammaglobulinemia: Secondary | ICD-10-CM

## 2013-01-04 DIAGNOSIS — C9000 Multiple myeloma not having achieved remission: Secondary | ICD-10-CM

## 2013-01-04 MED ORDER — OXYCODONE-ACETAMINOPHEN 5-325 MG PO TABS: 1.0000 | ORAL_TABLET | ORAL | Status: DC | PRN

## 2013-01-04 NOTE — Progress Notes (Signed)
Speech Language Pathology Treatment Patient Details  Name: Jeremy Parrish MRN: [id number] Date of Birth: [birth year]  Today's Date: 01/04/2013 Time: 1114-1200 SLP Time Calculation (min): 46 min  Authorization: medicare  Authorization Time Period: 12/14/2012-01/11/13  Authorization Visit#:  21 of     HPI:  Symptoms/Limitations Symptoms: "I'm doing a whole lot better than I was on Friday." (had colonoscopy) Pain Assessment Currently in Pain?: No/denies   Treatment  Aphasia Therapy Patient/Family Education Home Exercise Program  SLP Goals  Home Exercise SLP Goal: Patient will Perform Home Exercise Program: with supervision, verbal cues required/provided SLP Goal: Perform Home Exercise Program - Progress: Progressing toward goal SLP Short Term Goals SLP Short Term Goal 1: Pt will complete high-level word retrieval tasks with 95% acc and min assist. SLP Short Term Goal 1 - Progress: Progressing toward goal SLP Short Term Goal 2: Pt will implement speech intelligibility strategies during conversational speech (decrease rate, self correct errors, self monitoring, checking to see if listener understands) with min assist. SLP Short Term Goal 2 - Progress: Progressing toward goal SLP Short Term Goal 3: Pt will complete high level verbal expression tasks (object description, state function, provide short summary, etc) using short sentences with 90% acc and min assist  SLP Short Term Goal 3 - Progress: Progressing toward goal SLP Short Term Goal 4: Pt will complete reading comprehension tasks (multiple paragraphsl) with 90% acc and min assist via multiple choice response SLP Short Term Goal 4 - Progress: Progressing toward goal SLP Short Term Goal 5: Pt will be able to write short grocery lists, phone messages with 90% acc and min cues SLP Short Term Goal 5 - Progress: Progressing toward goal SLP Long Term Goals SLP Long Term Goal 1: Pt will increase verbal expression skills to [facility] for short  phrases with use of compensatory strategies as needed SLP Long Term Goal 1 - Progress: Progressing toward goal SLP Long Term Goal 2: Pt will increase reading comprehension skills to [facility] for short paragraph length material with use of strategies SLP Long Term Goal 2 - Progress: Progressing toward goal  Assessment/Plan  Patient Active Problem List  Diagnosis  . CANDIDIASIS, ORAL  . LYMPHOMA  . MULTIPLE  MYELOMA  . ANXIETY  . HYPERTENSION  . MYOCARDIAL INFARCTION  . CAD, dating to 1996 with multiple PCIs, Stents to RCA and LCX, Last cath 2007 patent stents,30% prox LAD lesion, EF 45%  . ASTHMA, UNSPECIFIED  . NEPHROLITHIASIS  . COUGH  . ELEVATED PROSTATE SPECIFIC ANTIGEN  . Nonspecific (abnormal) findings on radiological and other examination of body structure  . ROTATOR CUFF REPAIR, RIGHT, HX OF  . ABNORMAL LUNG XRAY  . Hypogammaglobulinemia  . Cerebral embolism with cerebral infarction  . Mute  . 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 Hx of bladder cancer  . Hyperlipidemia LDL goal < 100  . Prediabetes  . Hypokalemia  . Expressive aphasia  . Hemiplegia affecting right dominant side  . Diarrhea   SLP - End of Session Activity Tolerance: Patient tolerated treatment well General Behavior During Session: [name] for tasks performed Cognition: [facility] for tasks performed  SLP Assessment/Plan Clinical Impression Statement: [name] was very talkative today and was excited to tell me that he made several phone calls this morning and had to write down an address to dictation and everyone complimented him on how well he was speaking. He did a great job with 3-syllable words, but had difficulty with  4-5 syllable words during reading and repetition tasks. He noted that when he slows down during oral reading, he is more fluent. Today he read aloud a 3-paragraph story and had increased difficulty with last paragraph due to  fatigue. He continues to make good progress and remains highly motivated. Speech Therapy Frequency: min 2x/week Duration: 1 week (3 weeks) Treatment/Interventions: Patient/family education;SLP instruction and feedback;Compensatory strategies;Language facilitation Potential to Achieve Goals: Good     Oma Alpert 01/04/2013, 12:30 PM

## 2013-01-05 ENCOUNTER — Encounter (INDEPENDENT_AMBULATORY_CARE_PROVIDER_SITE_OTHER): Payer: Self-pay | Admitting: *Deleted

## 2013-01-05 ENCOUNTER — Telehealth (HOSPITAL_COMMUNITY): Payer: Self-pay | Admitting: Oncology

## 2013-01-05 NOTE — Telephone Encounter (Signed)
He should have refills left of his K+.

## 2013-01-07 ENCOUNTER — Ambulatory Visit (HOSPITAL_COMMUNITY): Payer: Medicare Other | Admitting: Speech Pathology

## 2013-01-07 DIAGNOSIS — N281 Cyst of kidney, acquired: Secondary | ICD-10-CM | POA: Diagnosis not present

## 2013-01-07 DIAGNOSIS — R3129 Other microscopic hematuria: Secondary | ICD-10-CM | POA: Diagnosis not present

## 2013-01-08 ENCOUNTER — Encounter: Payer: Self-pay | Admitting: Vascular Surgery

## 2013-01-08 ENCOUNTER — Telehealth (HOSPITAL_COMMUNITY): Payer: Self-pay | Admitting: Oncology

## 2013-01-08 NOTE — Telephone Encounter (Signed)
Pt said he was told to take 3 potassium a day he will need 90 pills he has refills but only 60 pills

## 2013-01-11 ENCOUNTER — Ambulatory Visit (HOSPITAL_COMMUNITY)
Admission: RE | Admit: 2013-01-11 | Discharge: 2013-01-11 | Disposition: A | Discharge status: Home / Self Care | Payer: Medicare Other | Source: Ambulatory Visit | Attending: Neurology | Admitting: Neurology

## 2013-01-11 DIAGNOSIS — I69920 Aphasia following unspecified cerebrovascular disease: Secondary | ICD-10-CM | POA: Diagnosis not present

## 2013-01-11 DIAGNOSIS — IMO0001 Reserved for inherently not codable concepts without codable children: Secondary | ICD-10-CM | POA: Insufficient documentation

## 2013-01-11 NOTE — Progress Notes (Signed)
Speech Language Pathology Treatment Patient Details  Name: Jeremy Parrish MRN: [id number] Date of Birth: Aug 02, 1941  Today's Date: 01/11/2013 Time: 1115-1200 SLP Time Calculation (min): 45 min  Authorization: medicare  Authorization Time Period: 12/14/2012-01/11/13  Authorization Visit#:  22 of     HPI:  Symptoms/Limitations Symptoms: "How are you doing?" Pain Assessment Currently in Pain?: No/denies  Treatment  Aphasia Therapy Patient/Family Education Home Exercise Program  SLP Goals  Home Exercise SLP Goal: Patient will Perform Home Exercise Program: with supervision, verbal cues required/provided SLP Short Term Goals SLP Short Term Goal 1: Pt will complete high-level word retrieval tasks with 95% acc and min assist. SLP Short Term Goal 1 - Progress: Progressing toward goal SLP Short Term Goal 2: Pt will implement speech intelligibility strategies during conversational speech (decrease rate, self correct errors, self monitoring, checking to see if listener understands) with min assist. SLP Short Term Goal 2 - Progress: Progressing toward goal SLP Short Term Goal 3: Pt will complete high level verbal expression tasks (object description, state function, provide short summary, etc) using short sentences with 90% acc and min assist  SLP Short Term Goal 3 - Progress: Progressing toward goal SLP Short Term Goal 4: Pt will complete reading comprehension tasks (multiple paragraphsl) with 90% acc and min assist via multiple choice response SLP Short Term Goal 4 - Progress: Met SLP Short Term Goal 5: Pt will be able to write short grocery lists, phone messages with 90% acc and min cues SLP Short Term Goal 5 - Progress: Met SLP Long Term Goals SLP Long Term Goal 1: Pt will increase verbal expression skills to [facility] for short phrases with use of compensatory strategies as needed SLP Long Term Goal 1 - Progress: Progressing toward goal SLP Long Term Goal 2: Pt will increase reading  comprehension skills to [facility] for short paragraph length material with use of strategies SLP Long Term Goal 2 - Progress: Met  Assessment/Plan  Patient Active Problem List  Diagnosis  . CANDIDIASIS, ORAL  . LYMPHOMA  . MULTIPLE  MYELOMA  . ANXIETY  . HYPERTENSION  . MYOCARDIAL INFARCTION  . CAD, dating to 1996 with multiple PCIs, Stents to RCA and LCX, Last cath 2007 patent stents,30% prox LAD lesion, EF 45%  . ASTHMA, UNSPECIFIED  . NEPHROLITHIASIS  . COUGH  . ELEVATED PROSTATE SPECIFIC ANTIGEN  . Nonspecific (abnormal) findings on radiological and other examination of body structure  . ROTATOR CUFF REPAIR, RIGHT, HX OF  . ABNORMAL LUNG XRAY  . Hypogammaglobulinemia  . Cerebral embolism with cerebral infarction  . Mute  . 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 Hx of bladder cancer  . Hyperlipidemia LDL goal < 100  . Prediabetes  . Hypokalemia  . Expressive aphasia  . Hemiplegia affecting right dominant side  . Diarrhea   SLP - End of Session Activity Tolerance: Patient tolerated treatment well General Behavior During Session: [facility] for tasks performed Cognition: [facility] for tasks performed  SLP Assessment/Plan Clinical Impression Statement: Jeremy Parrish is pleased with his progress and acknowledges that his fluency improves when he decreases his rate. He tends to get very excited and speaks quickly with tension. He did an excellent job with oral reading task (4+ paragraphs) with ~80% intelligibility and min cues for help with multisyllabic words. We discussed his progress today and plans to discharge from speech therapy after next session. Speech Therapy Frequency: min 2x/week Duration: 1 week (3 weeks) Treatment/Interventions: Patient/family  education;SLP instruction and feedback;Compensatory strategies;Language facilitation Potential to Achieve Goals: Good     PORTER,DABNEY 01/11/2013, 1:20 PM

## 2013-01-12 ENCOUNTER — Other Ambulatory Visit (HOSPITAL_COMMUNITY): Payer: Self-pay | Admitting: Oncology

## 2013-01-12 DIAGNOSIS — E876 Hypokalemia: Secondary | ICD-10-CM

## 2013-01-12 MED ORDER — POTASSIUM CHLORIDE CRYS ER 20 MEQ PO TBCR: 20.0000 meq | EXTENDED_RELEASE_TABLET | Freq: Three times a day (TID) | ORAL | Status: DC

## 2013-01-14 ENCOUNTER — Encounter (HOSPITAL_COMMUNITY): Payer: Medicare Other | Attending: Oncology

## 2013-01-14 ENCOUNTER — Ambulatory Visit (HOSPITAL_COMMUNITY)
Admission: RE | Admit: 2013-01-14 | Discharge: 2013-01-14 | Disposition: A | Discharge status: Home / Self Care | Payer: Medicare Other | Source: Ambulatory Visit | Attending: Neurology | Admitting: Neurology

## 2013-01-14 DIAGNOSIS — C8589 Other specified types of non-Hodgkin lymphoma, extranodal and solid organ sites: Secondary | ICD-10-CM | POA: Insufficient documentation

## 2013-01-14 DIAGNOSIS — C9000 Multiple myeloma not having achieved remission: Secondary | ICD-10-CM | POA: Insufficient documentation

## 2013-01-14 DIAGNOSIS — IMO0001 Reserved for inherently not codable concepts without codable children: Secondary | ICD-10-CM | POA: Diagnosis not present

## 2013-01-14 DIAGNOSIS — I69920 Aphasia following unspecified cerebrovascular disease: Secondary | ICD-10-CM | POA: Diagnosis not present

## 2013-01-14 LAB — CBC WITH DIFFERENTIAL/PLATELET
Basophils Absolute: 0.1 10*3/uL (ref 0.0–0.1)
Basophils Relative: 1 % (ref 0–1)
Eosinophils Relative: 1 % (ref 0–5)
Lymphocytes Relative: 29 % (ref 12–46)
MCHC: 33.6 g/dL (ref 30.0–36.0)
MCV: 90.7 fL (ref 78.0–100.0)
Neutro Abs: 2.2 10*3/uL (ref 1.7–7.7)
Platelets: 155 10*3/uL (ref 150–400)
RDW: 16.2 % — ABNORMAL HIGH (ref 11.5–15.5)
WBC: 4.2 10*3/uL (ref 4.0–10.5)

## 2013-01-14 LAB — LACTATE DEHYDROGENASE: LDH: 276 U/L — ABNORMAL HIGH (ref 94–250)

## 2013-01-14 LAB — COMPREHENSIVE METABOLIC PANEL
ALT: 21 U/L (ref 0–53)
AST: 26 U/L (ref 0–37)
Albumin: 3.7 g/dL (ref 3.5–5.2)
CO2: 24 mEq/L (ref 19–32)
Calcium: 9.3 mg/dL (ref 8.4–10.5)
GFR calc non Af Amer: >90 mL/min (ref >90)
Sodium: 138 mEq/L (ref 135–145)

## 2013-01-14 NOTE — Progress Notes (Signed)
Speech Language Pathology Treatment Patient Details  Name: Jeremy Parrish MRN: [id number] Date of Birth: 13-Sep-1941  Today's Date: 01/14/2013 Time: 1191-4782 SLP Time Calculation (min): 62 min  Authorization: medicare  Authorization Time Period: 12/14/2012-01/11/13  Authorization Visit#:  23 of   23  HPI:  Symptoms/Limitations Symptoms: Accompanied by his wife today for final sesssion. Pain Assessment Currently in Pain?: No/denies    Treatment  Aphasia Therapy Patient/Family Education Home Exercise Program  SLP Goals  Home Exercise SLP Goal: Patient will Perform Home Exercise Program: with supervision, verbal cues required/provided SLP Goal: Perform Home Exercise Program - Progress: Met SLP Short Term Goals SLP Short Term Goal 1: Pt will complete high-level word retrieval tasks with 95% acc and min assist. SLP Short Term Goal 1 - Progress: Met SLP Short Term Goal 2: Pt will implement speech intelligibility strategies during conversational speech (decrease rate, self correct errors, self monitoring, checking to see if listener understands) with min assist. SLP Short Term Goal 2 - Progress: Met SLP Short Term Goal 3: Pt will complete high level verbal expression tasks (object description, state function, provide short summary, etc) using short sentences with 90% acc and min assist  SLP Short Term Goal 3 - Progress: Met SLP Short Term Goal 4: Pt will complete reading comprehension tasks (multiple paragraphsl) with 90% acc and min assist via multiple choice response SLP Short Term Goal 4 - Progress: Met SLP Short Term Goal 5: Pt will be able to write short grocery lists, phone messages with 90% acc and min cues SLP Short Term Goal 5 - Progress: Met SLP Long Term Goals SLP Long Term Goal 1: Pt will increase verbal expression skills to [facility] for short phrases with use of compensatory strategies as needed SLP Long Term Goal 1 - Progress: Met SLP Long Term Goal 2: Pt will increase reading  comprehension skills to [facility] for short paragraph length material with use of strategies SLP Long Term Goal 2 - Progress: Met  Assessment/Plan  Patient Active Problem List  Diagnosis  . CANDIDIASIS, ORAL  . LYMPHOMA  . MULTIPLE  MYELOMA  . ANXIETY  . HYPERTENSION  . MYOCARDIAL INFARCTION  . CAD, dating to 1996 with multiple PCIs, Stents to RCA and LCX, Last cath 2007 patent stents,30% prox LAD lesion, EF 45%  . ASTHMA, UNSPECIFIED  . NEPHROLITHIASIS  . COUGH  . ELEVATED PROSTATE SPECIFIC ANTIGEN  . Nonspecific (abnormal) findings on radiological and other examination of body structure  . ROTATOR CUFF REPAIR, RIGHT, HX OF  . ABNORMAL LUNG XRAY  . Hypogammaglobulinemia  . Cerebral embolism with cerebral infarction  . Mute  . 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 Hx of bladder cancer  . Hyperlipidemia LDL goal < 100  . Prediabetes  . Hypokalemia  . Expressive aphasia  . Hemiplegia affecting right dominant side  . Diarrhea   SLP - End of Session Activity Tolerance: Patient tolerated treatment well General Behavior During Session: [facility] for tasks performed Cognition: [facility] for tasks performed  SLP Assessment/Plan Clinical Impression Statement:  Mr. Eble has made tremendous progress in his speech/language abilities. When he first came to therapy he could not say a word due to aphasia and apraxia. He is now speaking at the conversation level with ~90% intelligibility. At times, he has difficulty with word retrieval, but he generally comes up with the word or a suitable substitute. He acknowledges that when he is excited, his rate increases which negatively  impacts his intelligibility. Mr. Lady is active in the community and he does not let his speech difficulties limit his interaction and communication with friends and family. I am hopeful that his naturalness of speech will continue to improve over time. No further  skilled SLP services indicated at this time.  GN Functional Limitations: Spoken language expressive Spoken Language Expression Current Status [phone]): At least 1 percent but less than 20 percent impaired, limited or restricted Spoken Language Expression Goal Status [phone]): At least 1 percent but less than 20 percent impaired, limited or restricted Spoken Language Expression Discharge Status [phone]): At least 1 percent but less than 20 percent impaired, limited or restricted  Thank you,  Havery Moros, CCC-SLP [phone]  Vasily Fedewa 01/14/2013, 3:26 PM

## 2013-01-14 NOTE — Progress Notes (Signed)
Labs drawn today for cbc/diff,cmp,mm panel,kllc

## 2013-01-15 ENCOUNTER — Other Ambulatory Visit (HOSPITAL_COMMUNITY): Payer: Medicare Other

## 2013-01-15 ENCOUNTER — Ambulatory Visit (HOSPITAL_COMMUNITY): Payer: Medicare Other

## 2013-01-20 ENCOUNTER — Encounter (HOSPITAL_BASED_OUTPATIENT_CLINIC_OR_DEPARTMENT_OTHER): Payer: Medicare Other

## 2013-01-20 DIAGNOSIS — C8589 Other specified types of non-Hodgkin lymphoma, extranodal and solid organ sites: Secondary | ICD-10-CM

## 2013-01-20 DIAGNOSIS — C9000 Multiple myeloma not having achieved remission: Secondary | ICD-10-CM | POA: Diagnosis not present

## 2013-01-20 MED ORDER — SODIUM CHLORIDE 0.9 % IV SOLN
Freq: Once | INTRAVENOUS | Status: AC
  Administered 2013-01-20: 13:00:00 via INTRAVENOUS

## 2013-01-20 MED ORDER — HEPARIN SOD (PORK) LOCK FLUSH 100 UNIT/ML IV SOLN
INTRAVENOUS | Status: AC
  Filled 2013-01-20: qty 5

## 2013-01-20 MED ORDER — HEPARIN SOD (PORK) LOCK FLUSH 100 UNIT/ML IV SOLN
500.0000 [IU] | Freq: Once | INTRAVENOUS | Status: AC | PRN
  Administered 2013-01-20: 500 [IU]
  Filled 2013-01-20: qty 5

## 2013-01-20 MED ORDER — SODIUM CHLORIDE 0.9 % IJ SOLN
10.0000 mL | INTRAMUSCULAR | Status: DC | PRN
  Administered 2013-01-20: 10 mL
  Filled 2013-01-20: qty 10

## 2013-01-20 MED ORDER — ZOLEDRONIC ACID 4 MG/5ML IV CONC
4.0000 mg | Freq: Once | INTRAVENOUS | Status: AC
  Administered 2013-01-20: 4 mg via INTRAVENOUS
  Filled 2013-01-20: qty 5

## 2013-01-20 NOTE — Progress Notes (Signed)
Tolerated well

## 2013-01-25 ENCOUNTER — Encounter: Payer: Self-pay | Admitting: Vascular Surgery

## 2013-01-26 ENCOUNTER — Ambulatory Visit (INDEPENDENT_AMBULATORY_CARE_PROVIDER_SITE_OTHER): Payer: Medicare Other | Admitting: Vascular Surgery

## 2013-01-26 ENCOUNTER — Encounter: Payer: Self-pay | Admitting: Vascular Surgery

## 2013-01-26 VITALS — BP 134/76 | HR 69 | Temp 98.1°F | Ht 68.0 in | Wt 191.1 lb

## 2013-01-26 DIAGNOSIS — I723 Aneurysm of iliac artery: Secondary | ICD-10-CM

## 2013-01-26 LAB — MULTIPLE MYELOMA PANEL, SERUM
Alpha-1-Globulin: 4.7 % (ref 2.9–4.9)
Alpha-2-Globulin: 10.2 % (ref 7.1–11.8)
IgA: 829 mg/dL — ABNORMAL HIGH (ref 68–379)
IgG (Immunoglobin G), Serum: 4940 mg/dL — ABNORMAL HIGH (ref 650–1600)
Total Protein: 9.2 g/dL — ABNORMAL HIGH (ref 6.0–8.3)

## 2013-01-26 NOTE — Progress Notes (Signed)
Subjective:     Patient ID: Jeremy Parrish, male   DOB: Apr 20, 1941, 72 y.o.   MRN: [id number]  HPIthis 72 year old male was referred by Dr. Natalia Leatherwood for evaluation of the left common iliac artery aneurysm. This was discovered on a recent CT scan performed at [facility]. Patient denies any abdominal or back symptoms. Patient does have a history of prostatectomy performed by Dr. Aldean Ast many years ago for prostate cancer. He also has a significant history of coronary artery disease with probably 4 or 5 stents placed in the past by Dr. Nanetta Batty. Patient denies any active cardiac symptoms at this time including chest pain, , PND, or orthopnea.  Past Medical History  Diagnosis Date  . Hypertension   . Heart disease   . Kidney stones     history  . Lung mass   . Heart murmur   . 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 Coronary artery disease   . Depression   . Shortness of breath   . Stroke 2013  . Peripheral arterial disease   . Bladder neck contracture   . Personal history of other diseases of circulatory system   . Aortic aneurysm of unspecified site without mention of rupture   . Arthritis   . Intestinovesical fistula   . Esophageal reflux   . Hyperlipidemia   . Anemia   . CHF (congestive heart failure)   . COPD (chronic obstructive pulmonary disease)   . Myocardial infarction   . Cancer   . Prostate cancer 2000  . Multiple myeloma(203.0)   . Hx of bladder cancer 10/07/2012  . Non Hodgkin's lymphoma     History  Substance Use Topics  . Smoking status: Former Smoker -- 1.00 packs/day for 20 years    Types: Cigarettes    Quit date: 11/14/1994  . Smokeless tobacco: Never Used  . Alcohol Use: No     Comment: previously drank but none for at least 15 years.    Family History  Problem Relation Age of Onset  . Colon cancer Neg Hx   . Colon polyps Neg Hx   . Cancer Father     bladder  .  Heart disease Father     before age 55  . Hypertension Mother   . Cancer Brother   . Heart disease Brother     before age 49  . Heart disease Sister     before age 64  . Hyperlipidemia Sister   . Hypertension Sister   . Heart attack Sister     Allergies  Allergen Reactions  . Diphenhydramine Hcl     REACTION: "hyper"  . Morphine And Related Other (See Comments)    hallucinations    Current outpatient prescriptions:amLODipine (NORVASC) 10 MG tablet, Take 10 mg by mouth daily.  , Disp: , Rfl: ;  aspirin 81 MG tablet, Take 81 mg by mouth daily., Disp: , Rfl: ;  citalopram (CELEXA) 40 MG tablet, Take 40 mg by mouth daily.  , Disp: , Rfl: ;  clopidogrel (PLAVIX) 75 MG tablet, , Disp: , Rfl: ;  gabapentin (NEURONTIN) 600 MG tablet, Take 600-1,200 mg by mouth 4 (four) times daily. Takes 1 tablet three times a day, Disp: , Rfl:  potassium chloride SA (KLOR-CON M20) 20 MEQ tablet, Take 1 tablet (20 mEq total) by mouth 3 (three) times daily., Disp: 90 tablet, Rfl: 1;  simvastatin (ZOCOR) 20 MG tablet, Take 20 mg by  mouth at bedtime.  , Disp: , Rfl: ;  amoxicillin-clavulanate (AUGMENTIN) 875-125 MG per tablet, Take 1 tablet by mouth 2 (two) times daily., Disp: 20 tablet, Rfl: 0 Fluticasone-Salmeterol (ADVAIR) 250-50 MCG/DOSE AEPB, Inhale 1 puff into the lungs as needed. Wheezing/shortness of breath., Disp: , Rfl: ;  ibuprofen (ADVIL,MOTRIN) 200 MG tablet, Take 200 mg by mouth every 4 (four) hours as needed. Pain., Disp: , Rfl: ;  lenalidomide (REVLIMID) 10 MG capsule, Take 40 mg by mouth daily. Take 1 capsule by mouth daily for 7 days on followed by 7 days off, Disp: , Rfl:  loperamide (IMODIUM A-D) 2 MG tablet, Take 2 mg by mouth 4 (four) times daily as needed. Diarrhea., Disp: , Rfl: ;  moxifloxacin (AVELOX) 400 MG tablet, Take 1 tablet (400 mg total) by mouth daily., Disp: 10 tablet, Rfl: 0;  nitroGLYCERIN (NITROSTAT) 0.4 MG SL tablet, Place 0.4 mg under the tongue every 5 (five) minutes as needed.  For chest pain, Disp: , Rfl: ;  omeprazole (PRILOSEC) 40 MG capsule, Take 40 mg by mouth daily., Disp: , Rfl:  oxyCODONE-acetaminophen (PERCOCET/ROXICET) 5-325 MG per tablet, Take 1 tablet by mouth every 4 (four) hours as needed. For pain, Disp: 60 tablet, Rfl: 0;  temazepam (RESTORIL) 30 MG capsule, Take 1 capsule (30 mg total) by mouth at bedtime as needed for sleep., Disp: 30 capsule, Rfl: 1;  Zoledronic Acid (ZOMETA) 4 MG/100ML IVPB, Inject 4 mg into the vein every 30 (thirty) days.  , Disp: , Rfl:  zolpidem (AMBIEN) 5 MG tablet, Take 5 mg by mouth at bedtime as needed. For sleep, Disp: , Rfl:   BP 134/76  Pulse 69  Temp(Src) 98.1 F (36.7 C) (Oral)  Ht 5\' 8"  (1.727 m)  Wt 191 lb 1.6 oz (86.682 kg)  BMI 29.06 kg/m2  SpO2 100%  Body mass index is 29.06 kg/(m^2).           Review of Systems Complains of occasional dyspnea on exertion, leg discomfort with walking, productive cough, wheezing, weakness in arms and legs, and history of stroke in November 2013 with slurred speech.. Also complains of occasional hematuria Patient has history of non-Hodgkin's lymphoma, myeloma which required bone marrow transplant-doing well from that standpoint      Objective:   Physical Exam blood pressure 134/76 heart rate 69 respirations 18 Gen.-alert and oriented x3 in no apparent distress HEENT normal for age Lungs no rhonchi or wheezing Cardiovascular regular rhythm no murmurs carotid pulses 3+ palpable no bruits audible Abdomen soft nontender no palpable masses Musculoskeletal free of  major deformities Skin clear -no rashes Neurologic   Intelligible but slightly slurred speech-good strength in extremities Lower extremities 3+ femoral and dorsalis pedis pulses palpable bilaterally with no edema  Today I reviewed the CT angiogram performed at [facility]. Infrarenal aorta appears relatively normal. No evidence of aortic aneurysm. He does have a focal 2.4 cm left common iliac artery  aneurysm which terminates a few centimeters proximal to the origin of the hypogastric. The right common iliac artery is of normal size.        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

## 2013-01-26 NOTE — Addendum Note (Signed)
Addended by: Adria Dill L on: 01/26/2013 10:59 AM   Modules accepted: Orders

## 2013-02-01 ENCOUNTER — Other Ambulatory Visit (HOSPITAL_COMMUNITY): Payer: Self-pay | Admitting: Oncology

## 2013-02-01 DIAGNOSIS — C9000 Multiple myeloma not having achieved remission: Secondary | ICD-10-CM

## 2013-02-01 MED ORDER — LENALIDOMIDE 10 MG PO CAPS: 10.0000 mg | ORAL_CAPSULE | Freq: Every day | ORAL | Status: DC

## 2013-02-08 ENCOUNTER — Encounter (HOSPITAL_BASED_OUTPATIENT_CLINIC_OR_DEPARTMENT_OTHER): Payer: Medicare Other

## 2013-02-08 DIAGNOSIS — C8589 Other specified types of non-Hodgkin lymphoma, extranodal and solid organ sites: Secondary | ICD-10-CM

## 2013-02-08 DIAGNOSIS — C9000 Multiple myeloma not having achieved remission: Secondary | ICD-10-CM

## 2013-02-08 LAB — LACTATE DEHYDROGENASE: LDH: 197 U/L (ref 94–250)

## 2013-02-08 NOTE — Progress Notes (Signed)
Labs drawn today for ldh, mm panel,kllc

## 2013-02-09 LAB — KAPPA/LAMBDA LIGHT CHAINS: Kappa, lambda light chain ratio: 0.77 (ref 0.26–1.65)

## 2013-02-10 ENCOUNTER — Encounter (HOSPITAL_COMMUNITY): Payer: Medicare Other | Attending: Oncology | Admitting: Oncology

## 2013-02-10 VITALS — BP 133/76 | HR 78 | Temp 97.8°F | Resp 18 | Wt 197.2 lb

## 2013-02-10 DIAGNOSIS — C9000 Multiple myeloma not having achieved remission: Secondary | ICD-10-CM | POA: Insufficient documentation

## 2013-02-10 DIAGNOSIS — C8589 Other specified types of non-Hodgkin lymphoma, extranodal and solid organ sites: Secondary | ICD-10-CM | POA: Insufficient documentation

## 2013-02-10 DIAGNOSIS — M810 Age-related osteoporosis without current pathological fracture: Secondary | ICD-10-CM | POA: Diagnosis not present

## 2013-02-10 DIAGNOSIS — D801 Nonfamilial hypogammaglobulinemia: Secondary | ICD-10-CM | POA: Diagnosis not present

## 2013-02-10 DIAGNOSIS — B009 Herpesviral infection, unspecified: Secondary | ICD-10-CM

## 2013-02-10 LAB — MULTIPLE MYELOMA PANEL, SERUM
Albumin ELP: 58 % (ref 55.8–66.1)
Beta 2: 4.6 % (ref 3.2–6.5)
Gamma Globulin: 13.8 % (ref 11.1–18.8)
M-Spike, %: NOT DETECTED g/dL

## 2013-02-10 LAB — COMPREHENSIVE METABOLIC PANEL
AST: 20 U/L (ref 0–37)
Albumin: 3.8 g/dL (ref 3.5–5.2)
Alkaline Phosphatase: 62 U/L (ref 39–117)
Chloride: 105 mEq/L (ref 96–112)
Potassium: 3.8 mEq/L (ref 3.5–5.1)
Sodium: 138 mEq/L (ref 135–145)
Total Bilirubin: 0.4 mg/dL (ref 0.3–1.2)

## 2013-02-10 MED ORDER — ACYCLOVIR 400 MG PO TABS: 400.0000 mg | ORAL_TABLET | Freq: Three times a day (TID) | ORAL | Status: DC

## 2013-02-10 NOTE — Patient Instructions (Addendum)
[facility] Discharge Instructions  RECOMMENDATIONS MADE BY THE CONSULTANT AND ANY TEST RESULTS WILL BE SENT TO YOUR REFERRING PHYSICIAN.  EXAM FINDINGS BY THE PHYSICIAN TODAY AND SIGNS OR SYMPTOMS TO REPORT TO CLINIC OR PRIMARY PHYSICIAN: Exam and discussion by PA.  You are doing well.  MEDICATIONS PRESCRIBED:  Acyclovir take three times daily for 7 days.  INSTRUCTIONS GIVEN AND DISCUSSED: Report fevers, sweats, or other problems.  SPECIAL INSTRUCTIONS/FOLLOW-UP: Follow-up as scheduled for zometa, labs and to be seen in 3 months.  Thank you for choosing [facility] to provide your oncology and hematology care.  To afford each patient quality time with our providers, please arrive at least 15 minutes before your scheduled appointment time.  With your help, our goal is to use those 15 minutes to complete the necessary work-up to ensure our physicians have the information they need to help with your evaluation and healthcare recommendations.    Effective January 1st, 2014, we ask that you re-schedule your appointment with our physicians should you arrive 10 or more minutes late for your appointment.  We strive to give you quality time with our providers, and arriving late affects you and other patients whose appointments are after yours.    Again, thank you for choosing [facility].  Our hope is that these requests will decrease the amount of time that you wait before being seen by our physicians.       _____________________________________________________________  Should you have questions after your visit to [facility], please contact our office at [phone] between the hours of 8:30 a.m. and 5:00 p.m.  Voicemails left after 4:30 p.m. will not be returned until the following business day.  For prescription refill requests, have your pharmacy contact our office with your prescription refill request.

## 2013-02-10 NOTE — Progress Notes (Signed)
Jeremy Asa, MD [address] [place] [address]  MULTIPLE  MYELOMA  LYMPHOMA  CURRENT THERAPY: Revlimid 10 mg 7 days on and 7 days off  INTERVAL HISTORY: Jeremy Parrish 72 y.o. male returns for  regular  visit for followup of  IgG lambda multiple myeloma status post 4 cycles of dexamethasone and Velcade followed by bone marrow transplantation by Dr. Greggory Stallion at [facility] October 2011.  Now on maintenance Revlimid which he is tolerating well.    Jeremy Parrish is doing very well.    He denies any pulmonary complaints.   He does note a right upper lip cold sore that arose a few days ago.  I can not get him to definitively tell me when it started.  I will treat with Acyclovir 400 mg TID x 7 days.   He also notes a right shoulder pain which he has been following orthopod for.  It is recommended, it sounds like, arthroscopy, but he does not want that does at this time.  I will defer to ortho.   He continues to take Revlimid 10 mg 7 days on and 7 days off maintenance.  He is tolerating well.   Hematologically, she denies any complaints and ROS questioning is negative.   Past Medical History  Diagnosis Date  . Hypertension   . Heart disease   . Kidney stones     history  . Lung mass   . Heart murmur   . 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 Coronary artery disease   . Depression   . Shortness of breath   . Stroke 2013  . Peripheral arterial disease   . Bladder neck contracture   . Personal history of other diseases of circulatory system   . Aortic aneurysm of unspecified site without mention of rupture   . Arthritis   . Intestinovesical fistula   . Esophageal reflux   . Hyperlipidemia   . Anemia   . CHF (congestive heart failure)   . COPD (chronic obstructive pulmonary disease)   . Myocardial infarction   . Cancer   . Prostate cancer 2000  . Multiple myeloma(203.0)   . Hx of bladder cancer 10/07/2012  . Non  Hodgkin's lymphoma     has CANDIDIASIS, ORAL; LYMPHOMA; MULTIPLE  MYELOMA; ANXIETY; HYPERTENSION; MYOCARDIAL INFARCTION; CAD, dating to 1996 with multiple PCIs, Stents to RCA and LCX, Last cath 2007 patent stents,30% prox LAD lesion, EF 45%; ASTHMA, UNSPECIFIED; NEPHROLITHIASIS; COUGH; ELEVATED PROSTATE SPECIFIC ANTIGEN; Nonspecific (abnormal) findings on radiological and other examination of body structure; ROTATOR CUFF REPAIR, RIGHT, HX OF; ABNORMAL LUNG XRAY; Hypogammaglobulinemia; Cerebral embolism with cerebral infarction; [place]; H/O cardiac pacemaker, [employer]tr[employer] REVO, MRI conditional device, 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     is allergic to diphenhydramine hcl and morphine and related.  Mr. Micke had no medications administered during this visit.  Past Surgical History  Procedure Laterality Date  . Prostate surgery    . Heart stents x 5    . Portacath placement    . Wrist surgery      right  . Left ear skin cancer removed    . Bone marrow transplant    . Pacemaker insertion    . Coronary angioplasty    . Insert / replace / remove  pacemaker    . Tee without cardioversion  10/13/2012    Procedure: TRANSESOPHAGEAL ECHOCARDIOGRAM (TEE);  Surgeon: Thurmon Fair, MD;  Location: [facility] ENDOSCOPY;  Service: Cardiovascular;  Laterality: N/A;  pat/kay/echo notified  . Colonoscopy N/A 01/01/2013    Procedure: COLONOSCOPY;  Surgeon: Malissa Hippo, MD;  Location: [facility] ENDO SUITE;  Service: Endoscopy;  Laterality: N/A;  825-moved to 940 Ann notified pt    Denies any headaches, dizziness, double vision, fevers, chills, night sweats, nausea, vomiting, diarrhea, constipation, chest pain, heart palpitations, shortness of breath, blood in stool, black tarry stool, urinary pain, urinary burning,  urinary frequency, hematuria.   PHYSICAL EXAMINATION  ECOG PERFORMANCE STATUS: 1 - Symptomatic but completely ambulatory  Filed Vitals:   02/10/13 1016  BP: 133/76  Pulse: 78  Temp: 97.8 F (36.6 C)  Resp: 18    GENERAL:alert, no distress, well nourished, well developed, comfortable, cooperative and smiling SKIN: skin color, texture, turgor are normal, no rashes or significant lesions HEAD: Normocephalic, No masses, lesions, tenderness or abnormalities.  Erythematous ulcer on right upper lip. EYES: normal, Conjunctiva are pink and non-injected EARS: External ears normal OROPHARYNX:mucous membranes are moist  NECK: supple, no adenopathy, thyroid normal size, non-tender, without nodularity, no stridor, non-tender, trachea midline LYMPH:  no palpable lymphadenopathy BREAST:not examined LUNGS: clear to auscultation and percussion HEART: regular rate & rhythm, no murmurs, no gallops, S1 normal and S2 normal ABDOMEN:abdomen soft, non-tender, obese and normal bowel sounds BACK: Back symmetric, no curvature., No CVA tenderness EXTREMITIES:less then 2 second capillary refill, no joint deformities, effusion, or inflammation, no edema, no skin discoloration, no clubbing, no cyanosis  NEURO: alert & oriented x 3 with dysarthric speech, no focal motor/sensory deficits, gait normal.  Strength is 5+ B/L    LABORATORY DATA: Results for ELLSWORTH, WALDSCHMIDT (MRN [id number]) as of 02/10/2013 10:27  Ref. Range 01/14/2013 10:33 01/14/2013 10:33  Sodium Latest Range: 135-145 mEq/L 138   Potassium Latest Range: 3.5-5.1 mEq/L 4.0   Chloride Latest Range: 96-112 mEq/L 104   CO2 Latest Range: 19-32 mEq/L 24   BUN Latest Range: 6-23 mg/dL 11   Creatinine Latest Range: 0.50-1.35 mg/dL 4.09   Calcium Latest Range: 8.4-10.5 mg/dL 9.3   GFR calc non Af Amer Latest Range: >90 mL/min >90   GFR calc Af Amer Latest Range: >90 mL/min >90   Glucose Latest Range: 70-99 mg/dL 86   Alkaline Phosphatase Latest Range:  39-117 U/L 67   Albumin Latest Range: 3.5-5.2 g/dL 3.7   AST Latest Range: 0-37 U/L 26   ALT Latest Range: 0-53 U/L 21   Total Protein Latest Range: 6.0-8.3 g/dL 7.8 9.2 (H)  Total Bilirubin Latest Range: 0.3-1.2 mg/dL 0.3   LDH Latest Range: 94-250 U/L 276 (H)   Albumin ELP Latest Range: 55.8-66.1 %  52.1 (L)  Alpha-1-Globulin Latest Range: 2.9-4.9 %  4.7  Alpha-2-Globulin Latest Range: 7.1-11.8 %  10.2  Beta Globulin Latest Range: 4.7-7.2 %  7.6 (H)  Beta 2 Latest Range: 3.2-6.5 %  3.6  Gamma Globulin Latest Range: 11.1-18.8 %  21.8 (H)  M-SPIKE, % No range found  NOT DETECTED  SPE Interp. No range found  (NOTE)  Comment No range found  (NOTE)  IgG (Immunoglobin G), Serum Latest Range: 224-657-9580 mg/dL  8119 (H)  IgA Latest Range: 68-379 mg/dL  147 (H)  IgM, Serum Latest Range: 41-251 mg/dL  91  Kappa free light chain Latest Range: 0.33-1.94 mg/dL 8.29   Lamda free light chains Latest Range: 0.57-2.63  mg/dL 1.19   Kappa, lamda light chain ratio Latest Range: 0.26-1.65  0.89   WBC Latest Range: 4.0-10.5 K/uL 4.2   RBC Latest Range: 4.22-5.81 MIL/uL 4.20 (L)   Hemoglobin Latest Range: 13.0-17.0 g/dL 14.7 (L)   HCT Latest Range: 39.0-52.0 % 38.1 (L)   MCV Latest Range: 78.0-100.0 fL 90.7   MCH Latest Range: 26.0-34.0 pg 30.5   MCHC Latest Range: 30.0-36.0 g/dL 82.9   RDW Latest Range: 11.5-15.5 % 16.2 (H)   Platelets Latest Range: 150-400 K/uL 155   Neutrophils Relative Latest Range: 43-77 % 52   Lymphocytes Relative Latest Range: 12-46 % 29   Monocytes Relative Latest Range: 3-12 % 17 (H)   Eosinophils Relative Latest Range: 0-5 % 1   Basophils Relative Latest Range: 0-1 % 1   NEUT# Latest Range: 1.7-7.7 K/uL 2.2   Lymphocytes Absolute Latest Range: 0.7-4.0 K/uL 1.2   Monocytes Absolute Latest Range: 0.1-1.0 K/uL 0.7   Eosinophils Absolute Latest Range: 0.0-0.7 K/uL 0.1   Basophils Absolute Latest Range: 0.0-0.1 K/uL 0.1       ASSESSMENT:  1.  IgG lambda multiple myeloma  status post 4 cycles of dexamethasone and Velcade followed by bone marrow transplantation by Dr. Greggory Stallion at [facility] October 2011.  Now on maintenance Revlimid which he is tolerating well.   2. Non-Hodgkin's lymphoma, stage IVb, low-grade B-cell type, symptomatic at presentation in July 2002 to 6 cycles of R. CHOP with a complete response and PET scan in June 2012 showed no evidence for recurrent disease  3. History of probable bronchopneumonia with productive cough discolored yellow phlegm, low-grade fever today, coughing  4. Hypogammaglobulinemia  5. 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 CAD status post stent placement x5 in the past  7. Colovesical fistula repair years ago  8. Skin cancer on the right ear status post surgery by Dr. Park Liter  9. Recent CVA of left middle cerebral artery, causing expressive aphasia and right hemiparesis.  Both are significantly improved.  10. Osteoporosis on therapy with zoledronic acid calcium and vitamin D 11. 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   PLAN:  1. I personally reviewed and went over laboratory results with the patient. 2. Labs in 3 months: LDH, MM panel CBC diff. 3. Labs monthly: CMET for Zometa 4. Continue with every 4 weeks Zometa. 5. Continue Revlimid maintenance 6. Acyclovir 400 mg TID x 7 days. 7. Return in 3 months for follow-up.   All questions were answered. The patient knows to call the clinic with any problems, questions or concerns. We can certainly see the patient much sooner if necessary.  The patient and plan discussed with Glenford Peers, MD and he is in agreement with the aforementioned.  Mihran Lebarron

## 2013-02-12 ENCOUNTER — Encounter (HOSPITAL_COMMUNITY): Payer: Medicare Other

## 2013-02-16 ENCOUNTER — Ambulatory Visit (HOSPITAL_COMMUNITY)
Admission: RE | Admit: 2013-02-16 | Discharge: 2013-02-16 | Disposition: A | Discharge status: Home / Self Care | Payer: Medicare Other | Source: Ambulatory Visit | Attending: Cardiovascular Disease | Admitting: Cardiovascular Disease

## 2013-02-16 DIAGNOSIS — I6529 Occlusion and stenosis of unspecified carotid artery: Secondary | ICD-10-CM | POA: Diagnosis not present

## 2013-02-16 DIAGNOSIS — R0989 Other specified symptoms and signs involving the circulatory and respiratory systems: Secondary | ICD-10-CM | POA: Diagnosis not present

## 2013-02-16 NOTE — Progress Notes (Signed)
Carotid artery duplex doppler exam was completed. Larene Pickett RVT

## 2013-02-17 ENCOUNTER — Ambulatory Visit (INDEPENDENT_AMBULATORY_CARE_PROVIDER_SITE_OTHER): Payer: Medicare Other | Admitting: Neurology

## 2013-02-17 ENCOUNTER — Encounter: Payer: Self-pay | Admitting: Neurology

## 2013-02-17 ENCOUNTER — Encounter (HOSPITAL_BASED_OUTPATIENT_CLINIC_OR_DEPARTMENT_OTHER): Payer: Medicare Other

## 2013-02-17 VITALS — BP 140/85 | HR 80 | Temp 96.9°F | Resp 18

## 2013-02-17 VITALS — BP 137/84 | HR 72 | Temp 98.1°F | Ht 67.5 in | Wt 197.0 lb

## 2013-02-17 DIAGNOSIS — R4701 Aphasia: Secondary | ICD-10-CM

## 2013-02-17 DIAGNOSIS — C8589 Other specified types of non-Hodgkin lymphoma, extranodal and solid organ sites: Secondary | ICD-10-CM

## 2013-02-17 DIAGNOSIS — C9000 Multiple myeloma not having achieved remission: Secondary | ICD-10-CM

## 2013-02-17 DIAGNOSIS — I6350 Cerebral infarction due to unspecified occlusion or stenosis of unspecified cerebral artery: Secondary | ICD-10-CM

## 2013-02-17 MED ORDER — HEPARIN SOD (PORK) LOCK FLUSH 100 UNIT/ML IV SOLN
500.0000 [IU] | Freq: Once | INTRAVENOUS | Status: AC | PRN
  Administered 2013-02-17: 500 [IU]
  Filled 2013-02-17: qty 5

## 2013-02-17 MED ORDER — SODIUM CHLORIDE 0.9 % IV SOLN
Freq: Once | INTRAVENOUS | Status: AC
  Administered 2013-02-17: 11:00:00 via INTRAVENOUS

## 2013-02-17 MED ORDER — HEPARIN SOD (PORK) LOCK FLUSH 100 UNIT/ML IV SOLN
INTRAVENOUS | Status: AC
  Filled 2013-02-17: qty 5

## 2013-02-17 MED ORDER — ZOLEDRONIC ACID 4 MG/5ML IV CONC
4.0000 mg | Freq: Once | INTRAVENOUS | Status: AC
  Administered 2013-02-17: 4 mg via INTRAVENOUS
  Filled 2013-02-17: qty 5

## 2013-02-17 MED ORDER — SODIUM CHLORIDE 0.9 % IJ SOLN
10.0000 mL | INTRAMUSCULAR | Status: DC | PRN
  Administered 2013-02-17: 10 mL
  Filled 2013-02-17: qty 10

## 2013-02-17 NOTE — Progress Notes (Signed)
[facility] [address] [place]. [place] [address] [phone]       OFFICE FOLLOW-UP NOTE  [name] Date of Birth:  19-Apr-1941 Medical Record Number:  [id number]   HPI: 72 year old male seen for first office followup visit for hospital admission for stroke on 10/06/12 . He presented with sudden onset of inability to speak with right facial droop and right-sided weakness. His symptoms started resolving en route to the hospital. He met criteria for and was given IV TPA uneventfully. 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 Patient had a questionable history of atrial fibrillation. Patient had a pacemaker and cardiology interrogated the pacemaker but did not document atrial fibrillation. TEE was done which did not show any patent foramen ovale or cardiac source of embolism. Transthoraxic echocardiogram showed normal ejection fraction. Lipid profile and HbA1c was normal. CT angiogram of the neck showed only minor atheromatous changes and CT angiogram of the brain showed no large vessel stenosis. Vascular risk factors identified included coronary artery disease, hypertension, hyperlipidemia and pre diabetes. He states his speech is much better he can speak sentences but struggles when he tries to speak fast. He has finished outpatient speech therapy. His right hand strength is completely back to normal. The patient did not have outpatient cardiac telemetry done as planned as he has a pacemaker which was interrogated by his cardiologist and did not show atrial fibrillation. ROS:   14 system review of systems is positive for speech difficulties and snoring only PMH:  Past Medical History  Diagnosis Date  . Hypertension   . Heart disease   . Kidney stones     history  . Lung mass   . Heart murmur   . 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 Coronary artery  disease   . Depression   . Shortness of breath   . Stroke 2013  . Peripheral arterial disease   . Bladder neck contracture   . Personal history of other diseases of circulatory system   . Aortic aneurysm of unspecified site without mention of rupture   . Arthritis   . Intestinovesical fistula   . Esophageal reflux   . Hyperlipidemia   . Anemia   . CHF (congestive heart failure)   . COPD (chronic obstructive pulmonary disease)   . Myocardial infarction   . Cancer   . Prostate cancer 2000  . Multiple myeloma(203.0)   . Hx of bladder cancer 10/07/2012  . Non Hodgkin's lymphoma     Social History:  History   Social History  . Marital Status: Married    Spouse Name: Jeremy Parrish    Number of Children: 3  . Years of Education: N/A   Occupational History  . retired     Social History Main Topics  . Smoking status: Former Smoker -- 1.00 packs/day for 20 years    Types: Cigarettes    Quit date: 11/14/1994  . Smokeless tobacco: Never Used  . Alcohol Use: No     Comment: previously drank but none for at least 15 years.  . Drug Use: No  . Sexually Active: Not on file   Other Topics Concern  . Not on file   Social History Narrative  . No narrative on file    Medications:   Current Outpatient Prescriptions on File Prior to Visit  Medication Sig Dispense Refill  . amLODipine (NORVASC) 10 MG tablet  Take 10 mg by mouth daily.        Marland Kitchen aspirin 81 MG tablet Take 81 mg by mouth daily.      . citalopram (CELEXA) 40 MG tablet Take 40 mg by mouth daily.        . clopidogrel (PLAVIX) 75 MG tablet Take 75 mg by mouth daily.       Marland Kitchen gabapentin (NEURONTIN) 600 MG tablet Take 600-1,200 mg by mouth 4 (four) times daily. Takes 1 tablet three times a day      . lenalidomide (REVLIMID) 10 MG capsule Take 1 capsule (10 mg total) by mouth daily. Take 1 capsule by mouth daily for 7 days on followed by 7 days off  14 capsule  1  . potassium chloride SA (KLOR-CON M20) 20 MEQ tablet Take 1 tablet  (20 mEq total) by mouth 3 (three) times daily.  90 tablet  1  . simvastatin (ZOCOR) 20 MG tablet Take 20 mg by mouth at bedtime.         Current Facility-Administered Medications on File Prior to Visit  Medication Dose Route Frequency Provider Last Rate Last Dose  . sodium chloride 0.9 % injection 10 mL  10 mL Intracatheter PRN Ellouise Newer, PA-C   10 mL at 02/17/13 1039    Allergies:   Allergies  Allergen Reactions  . Diphenhydramine Hcl     REACTION: "hyper"  . Morphine And Related Other (See Comments)    hallucinations    Physical Exam General: well developed, well nourished, seated, in no evident distress Head: head normocephalic and atraumatic. Orohparynx benign Neck: supple with no carotid or supraclavicular bruits Cardiovascular: regular rate and rhythm, no murmurs Musculoskeletal: no deformity Skin:  no rash/petichiae. Right ear lobe deformity from prior skin cancer surgery Vascular:  Normal pulses all extremities  Neurologic Exam Mental Status: Awake and fully alert. Oriented to place and time. Recent and remote memory intact. Attention span, concentration and fund of knowledge appropriate. Mood and affect appropriate. Mild expressive aphasia with nonfluent speech and word finding difficulties. Only occasional paraphasias. Able to name repeat and comprehend well Cranial Nerves: Fundoscopic exam reveals sharp disc margins. Pupils equal, briskly reactive to light. Extraocular movements full without nystagmus. Visual fields full to confrontation. Hearing intact. Facial sensation intact. Face, tongue, palate moves normally and symmetrically.  Motor: Normal bulk and tone. Normal strength in all tested extremity muscles. Sensory.: intact to tough and pinprick and vibratory.  Coordination: Rapid alternating movements normal in all extremities. Finger-to-nose and heel-to-shin performed accurately bilaterally. Gait and Station: Arises from chair without difficulty. Stance is  normal. Gait demonstrates normal stride length and balance . Able to heel, toe and tandem walk without difficulty.  Reflexes: 1+ and symmetric. Toes downgoing.     ASSESSMENT:   72 year old male with embolic left MCA branch infarct in November 2013 treated with IV TPA with mild residual expressive aphasia. Multiple vascular risk factors of hypertension, hyperlipidemia and coronary artery disease.  PLAN: Continue aspirin and Plavix for secondary stroke prevention given history of cardiac stents as well as strict control of hypertension with blood pressure goal below 130/90 and hyperlipidemia with LDL cholesterol goal below 100 mg percent. I encouraged him to speak slowly and deliberately not been a rash to minimize his expressive language difficulties. Return for followup in 4 months with nurse practitioner

## 2013-02-17 NOTE — Progress Notes (Signed)
Received zometa 4 mg iv infusion to port a cath. Tolerated well.

## 2013-02-17 NOTE — Patient Instructions (Signed)
Patient was advised to stay on aspirin and Plavix for stroke prevention given history of cardiac stents and maintain strict control of hypertension with blood pressure goal below 130/90 and lipids with LDL cholesterol goal below 100 mg percent. I advised him to speak slowly and deliberately. Return for followup in 4 months with nurse practitioner.

## 2013-02-22 ENCOUNTER — Other Ambulatory Visit (HOSPITAL_COMMUNITY): Payer: Self-pay | Admitting: Oncology

## 2013-02-22 DIAGNOSIS — C9000 Multiple myeloma not having achieved remission: Secondary | ICD-10-CM

## 2013-02-22 MED ORDER — LENALIDOMIDE 10 MG PO CAPS: 10.0000 mg | ORAL_CAPSULE | Freq: Every day | ORAL | Status: DC

## 2013-02-23 DIAGNOSIS — J479 Bronchiectasis, uncomplicated: Secondary | ICD-10-CM | POA: Diagnosis not present

## 2013-02-23 DIAGNOSIS — D801 Nonfamilial hypogammaglobulinemia: Secondary | ICD-10-CM | POA: Diagnosis not present

## 2013-02-24 ENCOUNTER — Encounter: Payer: Self-pay | Admitting: Oncology

## 2013-02-25 DIAGNOSIS — M719 Bursopathy, unspecified: Secondary | ICD-10-CM | POA: Diagnosis not present

## 2013-02-25 DIAGNOSIS — M7500 Adhesive capsulitis of unspecified shoulder: Secondary | ICD-10-CM | POA: Diagnosis not present

## 2013-03-01 ENCOUNTER — Encounter: Payer: Self-pay | Admitting: Family Medicine

## 2013-03-01 ENCOUNTER — Ambulatory Visit (INDEPENDENT_AMBULATORY_CARE_PROVIDER_SITE_OTHER): Payer: Medicare Other | Admitting: Family Medicine

## 2013-03-01 VITALS — BP 122/72 | Temp 98.8°F | Wt 196.2 lb

## 2013-03-01 DIAGNOSIS — J309 Allergic rhinitis, unspecified: Secondary | ICD-10-CM

## 2013-03-01 NOTE — Patient Instructions (Signed)
Take two aleave twice per day as needed for groin pain. Also, take claritin 10 mg daily for allergies

## 2013-03-01 NOTE — Progress Notes (Signed)
  Subjective:    Patient ID: STRATTON VILLWOCK, male    DOB: 28-May-1941, 72 y.o.   MRN: [id number]  HPI Patient arrives to the office with a couple of concerns. He notes right groin pain. Sharp in nature. Comes and goes. Worse with certain motions. He recalls no sudden injury Sore throat intermittently. Worse in the morning. No associated fever. No significant cough. Significant allergy symptoms with sneezing and drainage   Review of Systems    ROS otherwise negative Objective:   Physical Exam  Alert vitals reviewed. Lungs clear. Heart regular rate and rhythm. HEENT slight nasal congestion. Abdominal exam benign. Inguinal and scrotal exam benign.      Assessment & Plan:   impression 1 allergic rhinitis discussed. #2 groin strain discussed. Plan Claritin when necessary. Advil when necessary. Symptomatic care discussed.

## 2013-03-02 ENCOUNTER — Other Ambulatory Visit (HOSPITAL_COMMUNITY): Payer: Self-pay | Admitting: Oncology

## 2013-03-02 DIAGNOSIS — J302 Other seasonal allergic rhinitis: Secondary | ICD-10-CM

## 2013-03-02 MED ORDER — LORATADINE-PSEUDOEPHEDRINE ER 10-240 MG PO TB24: 1.0000 | ORAL_TABLET | Freq: Every day | ORAL | Status: DC

## 2013-03-08 ENCOUNTER — Other Ambulatory Visit (HOSPITAL_COMMUNITY): Payer: Medicare Other

## 2013-03-08 ENCOUNTER — Telehealth (HOSPITAL_COMMUNITY): Payer: Self-pay | Admitting: *Deleted

## 2013-03-08 DIAGNOSIS — C9000 Multiple myeloma not having achieved remission: Secondary | ICD-10-CM

## 2013-03-08 MED ORDER — AMOXICILLIN-POT CLAVULANATE 875-125 MG PO TABS: 1.0000 | ORAL_TABLET | Freq: Two times a day (BID) | ORAL | Status: DC

## 2013-03-08 NOTE — Telephone Encounter (Signed)
Patient called c/o fever over the weekend of 99.5 to 100.5. He has green/yellow mucous and has been taking claritin for a week. Last labs done 3/31. He is questioning need for IVIG again.

## 2013-03-08 NOTE — Telephone Encounter (Signed)
Patient notified to come in for labs and that augmentin has been e-scribed. He  Will record temp twice a day, if not better by Wednesday will call us.

## 2013-03-09 ENCOUNTER — Encounter (HOSPITAL_BASED_OUTPATIENT_CLINIC_OR_DEPARTMENT_OTHER): Payer: Medicare Other

## 2013-03-09 DIAGNOSIS — C9000 Multiple myeloma not having achieved remission: Secondary | ICD-10-CM | POA: Diagnosis not present

## 2013-03-09 LAB — CBC WITH DIFFERENTIAL/PLATELET
Basophils Absolute: 0 10*3/uL (ref 0.0–0.1)
Basophils Relative: 0 % (ref 0–1)
HCT: 35.3 % — ABNORMAL LOW (ref 39.0–52.0)
Hemoglobin: 11.9 g/dL — ABNORMAL LOW (ref 13.0–17.0)
Lymphocytes Relative: 15 % (ref 12–46)
MCHC: 33.7 g/dL (ref 30.0–36.0)
Monocytes Absolute: 0.5 10*3/uL (ref 0.1–1.0)
Monocytes Relative: 8 % (ref 3–12)
Neutro Abs: 4.2 10*3/uL (ref 1.7–7.7)
Neutrophils Relative %: 74 % (ref 43–77)
RDW: 15 % (ref 11.5–15.5)
WBC: 5.7 10*3/uL (ref 4.0–10.5)

## 2013-03-09 LAB — COMPREHENSIVE METABOLIC PANEL
ALT: 19 U/L (ref 0–53)
AST: 18 U/L (ref 0–37)
Alkaline Phosphatase: 73 U/L (ref 39–117)
CO2: 22 mEq/L (ref 19–32)
GFR calc Af Amer: >90 mL/min (ref >90)
GFR calc non Af Amer: 88 mL/min — ABNORMAL LOW (ref >90)
Glucose, Bld: 105 mg/dL — ABNORMAL HIGH (ref 70–99)
Potassium: 4.1 mEq/L (ref 3.5–5.1)
Sodium: 136 mEq/L (ref 135–145)
Total Protein: 6.5 g/dL (ref 6.0–8.3)

## 2013-03-09 NOTE — Progress Notes (Signed)
Labs drawn today for cbc/diff,cmp,kllc,ldh,mm panel

## 2013-03-10 LAB — KAPPA/LAMBDA LIGHT CHAINS: Kappa, lambda light chain ratio: 0.88 (ref 0.26–1.65)

## 2013-03-11 ENCOUNTER — Other Ambulatory Visit (HOSPITAL_COMMUNITY): Payer: Self-pay | Admitting: Oncology

## 2013-03-11 ENCOUNTER — Telehealth: Payer: Self-pay | Admitting: Family Medicine

## 2013-03-11 DIAGNOSIS — K219 Gastro-esophageal reflux disease without esophagitis: Secondary | ICD-10-CM

## 2013-03-11 LAB — MULTIPLE MYELOMA PANEL, SERUM
IgA: 176 mg/dL (ref 68–379)
IgG (Immunoglobin G), Serum: 698 mg/dL (ref 650–1600)
M-Spike, %: NOT DETECTED g/dL
Total Protein: 5.9 g/dL — ABNORMAL LOW (ref 6.0–8.3)

## 2013-03-11 MED ORDER — OMEPRAZOLE 20 MG PO CPDR: 20.0000 mg | DELAYED_RELEASE_CAPSULE | Freq: Every day | ORAL | Status: DC

## 2013-03-11 NOTE — Telephone Encounter (Signed)
Acid reflux med 

## 2013-03-11 NOTE — Telephone Encounter (Signed)
Patient needs a 90 day supply of acid reflux to [facility] [place]

## 2013-03-15 ENCOUNTER — Other Ambulatory Visit (HOSPITAL_COMMUNITY): Payer: Self-pay | Admitting: Oncology

## 2013-03-15 ENCOUNTER — Encounter (HOSPITAL_COMMUNITY): Payer: Medicare Other | Attending: Oncology

## 2013-03-15 ENCOUNTER — Encounter (HOSPITAL_BASED_OUTPATIENT_CLINIC_OR_DEPARTMENT_OTHER): Payer: Medicare Other | Admitting: Oncology

## 2013-03-15 DIAGNOSIS — C8589 Other specified types of non-Hodgkin lymphoma, extranodal and solid organ sites: Secondary | ICD-10-CM | POA: Diagnosis not present

## 2013-03-15 DIAGNOSIS — J069 Acute upper respiratory infection, unspecified: Secondary | ICD-10-CM

## 2013-03-15 DIAGNOSIS — R05 Cough: Secondary | ICD-10-CM

## 2013-03-15 DIAGNOSIS — D801 Nonfamilial hypogammaglobulinemia: Secondary | ICD-10-CM

## 2013-03-15 DIAGNOSIS — R059 Cough, unspecified: Secondary | ICD-10-CM

## 2013-03-15 DIAGNOSIS — C9000 Multiple myeloma not having achieved remission: Secondary | ICD-10-CM

## 2013-03-15 DIAGNOSIS — B999 Unspecified infectious disease: Secondary | ICD-10-CM

## 2013-03-15 DIAGNOSIS — R093 Abnormal sputum: Secondary | ICD-10-CM | POA: Diagnosis not present

## 2013-03-15 LAB — BASIC METABOLIC PANEL
CO2: 22 mEq/L (ref 19–32)
Chloride: 106 mEq/L (ref 96–112)
Creatinine, Ser: 0.9 mg/dL (ref 0.50–1.35)
GFR calc Af Amer: >90 mL/min (ref >90)
Potassium: 3.8 mEq/L (ref 3.5–5.1)

## 2013-03-15 LAB — CBC WITH DIFFERENTIAL/PLATELET
Basophils Absolute: 0 10*3/uL (ref 0.0–0.1)
Eosinophils Relative: 2 % (ref 0–5)
HCT: 37.1 % — ABNORMAL LOW (ref 39.0–52.0)
Hemoglobin: 12.4 g/dL — ABNORMAL LOW (ref 13.0–17.0)
Lymphocytes Relative: 13 % (ref 12–46)
Lymphs Abs: 1.1 10*3/uL (ref 0.7–4.0)
MCV: 91.2 fL (ref 78.0–100.0)
Monocytes Absolute: 0.5 10*3/uL (ref 0.1–1.0)
Monocytes Relative: 6 % (ref 3–12)
Neutro Abs: 6.2 10*3/uL (ref 1.7–7.7)
RBC: 4.07 MIL/uL — ABNORMAL LOW (ref 4.22–5.81)
WBC: 8 10*3/uL (ref 4.0–10.5)

## 2013-03-15 MED ORDER — OXYCODONE-ACETAMINOPHEN 5-325 MG PO TABS: 1.0000 | ORAL_TABLET | Freq: Four times a day (QID) | ORAL | Status: DC | PRN

## 2013-03-15 MED ORDER — AMOXICILLIN-POT CLAVULANATE 875-125 MG PO TABS: 1.0000 | ORAL_TABLET | Freq: Two times a day (BID) | ORAL | Status: DC

## 2013-03-15 NOTE — Progress Notes (Signed)
Labs drawn today for cbc/diff,bmp 

## 2013-03-16 ENCOUNTER — Encounter (HOSPITAL_BASED_OUTPATIENT_CLINIC_OR_DEPARTMENT_OTHER): Payer: Medicare Other

## 2013-03-16 VITALS — BP 142/79 | HR 66 | Temp 96.8°F | Resp 20 | Wt 191.0 lb

## 2013-03-16 DIAGNOSIS — J069 Acute upper respiratory infection, unspecified: Secondary | ICD-10-CM

## 2013-03-16 DIAGNOSIS — D801 Nonfamilial hypogammaglobulinemia: Secondary | ICD-10-CM

## 2013-03-16 LAB — MAGNESIUM: Magnesium: 1.8 mg/dL (ref 1.5–2.5)

## 2013-03-16 MED ORDER — HEPARIN SOD (PORK) LOCK FLUSH 100 UNIT/ML IV SOLN
500.0000 [IU] | Freq: Once | INTRAVENOUS | Status: AC
  Administered 2013-03-16: 500 [IU] via INTRAVENOUS
  Filled 2013-03-16: qty 5

## 2013-03-16 MED ORDER — HEPARIN SOD (PORK) LOCK FLUSH 100 UNIT/ML IV SOLN
INTRAVENOUS | Status: AC
  Filled 2013-03-16: qty 5

## 2013-03-16 MED ORDER — SODIUM CHLORIDE 0.9 % IV SOLN
Freq: Once | INTRAVENOUS | Status: AC
  Administered 2013-03-16: 09:00:00 via INTRAVENOUS

## 2013-03-16 MED ORDER — IMMUNE GLOBULIN (HUMAN) 10 GM/200ML IV SOLN
80.0000 g | Freq: Once | INTRAVENOUS | Status: AC
  Administered 2013-03-16: 80 g via INTRAVENOUS
  Filled 2013-03-16: qty 1600

## 2013-03-16 MED ORDER — IMMUNE GLOBULIN (HUMAN) 10 GM/200ML IV SOLN
10.0000 g | Freq: Once | INTRAVENOUS | Status: AC
  Administered 2013-03-16: 10 g via INTRAVENOUS
  Filled 2013-03-16: qty 200

## 2013-03-16 MED ORDER — IMMUNE GLOBULIN (HUMAN) 10 GM/200ML IV SOLN: 1.0000 g/kg | Freq: Once | INTRAVENOUS | Status: DC

## 2013-03-16 NOTE — Progress Notes (Signed)
Patient seen as a walk-in.  He reports a continued cough that is productive of yellow sputum.  His cough is well controlled throughout the day with anti-tussives, but at night, he has difficulty sleeping due to the cough.   He is on Augmentin daily and he finishes that on Wednesday,  Despite antibiotic, he reports that he does not feel well.   He denies any fevers or chills.   He is concerned with this infection evolving into a more serious infection.  I have ordered him an additional 4 days worth of antibiotics.  I have also ordered him IVIG 1 g/kg on Day 1 and 2.  He is agreeable to this plan.  We will see him tomorrow for his Day 1 infusion.  Jehad Bisono

## 2013-03-16 NOTE — Progress Notes (Signed)
Tolerated infusion without problems. 

## 2013-03-17 ENCOUNTER — Ambulatory Visit (HOSPITAL_COMMUNITY): Payer: Medicare Other

## 2013-03-17 ENCOUNTER — Encounter (HOSPITAL_COMMUNITY): Payer: Medicare Other

## 2013-03-17 ENCOUNTER — Encounter (HOSPITAL_BASED_OUTPATIENT_CLINIC_OR_DEPARTMENT_OTHER): Payer: Medicare Other

## 2013-03-17 VITALS — BP 149/76 | HR 61 | Temp 98.0°F | Resp 18

## 2013-03-17 DIAGNOSIS — J069 Acute upper respiratory infection, unspecified: Secondary | ICD-10-CM

## 2013-03-17 DIAGNOSIS — D801 Nonfamilial hypogammaglobulinemia: Secondary | ICD-10-CM

## 2013-03-17 DIAGNOSIS — C8589 Other specified types of non-Hodgkin lymphoma, extranodal and solid organ sites: Secondary | ICD-10-CM

## 2013-03-17 DIAGNOSIS — C9000 Multiple myeloma not having achieved remission: Secondary | ICD-10-CM | POA: Diagnosis not present

## 2013-03-17 MED ORDER — IMMUNE GLOBULIN (HUMAN) 10 GM/200ML IV SOLN
80.0000 g | INTRAVENOUS | Status: DC
  Administered 2013-03-17: 80 g via INTRAVENOUS
  Filled 2013-03-17 (×2): qty 1600

## 2013-03-17 MED ORDER — HEPARIN SOD (PORK) LOCK FLUSH 100 UNIT/ML IV SOLN
500.0000 [IU] | Freq: Once | INTRAVENOUS | Status: AC | PRN
  Administered 2013-03-17: 500 [IU]
  Filled 2013-03-17: qty 5

## 2013-03-17 MED ORDER — IMMUNE GLOBULIN (HUMAN) 10 GM/200ML IV SOLN
1.0000 g/kg | Freq: Once | INTRAVENOUS | Status: DC
  Filled 2013-03-17: qty 1800

## 2013-03-17 MED ORDER — HEPARIN SOD (PORK) LOCK FLUSH 100 UNIT/ML IV SOLN
INTRAVENOUS | Status: AC
  Filled 2013-03-17: qty 5

## 2013-03-17 MED ORDER — SODIUM CHLORIDE 0.9 % IJ SOLN
10.0000 mL | INTRAMUSCULAR | Status: DC | PRN
  Filled 2013-03-17: qty 10

## 2013-03-17 MED ORDER — SODIUM CHLORIDE 0.9 % IV SOLN
Freq: Once | INTRAVENOUS | Status: AC
  Administered 2013-03-17: 09:00:00 via INTRAVENOUS

## 2013-03-17 MED ORDER — ZOLEDRONIC ACID 4 MG/5ML IV CONC
4.0000 mg | Freq: Once | INTRAVENOUS | Status: AC
  Administered 2013-03-17: 4 mg via INTRAVENOUS
  Filled 2013-03-17: qty 5

## 2013-03-17 MED ORDER — IMMUNE GLOBULIN (HUMAN) 10 GM/200ML IV SOLN
10.0000 g | Freq: Once | INTRAVENOUS | Status: AC
  Administered 2013-03-17: 10 g via INTRAVENOUS
  Filled 2013-03-17: qty 200

## 2013-03-17 NOTE — Progress Notes (Signed)
Tolerated well. Patient is aware that 4 additional days of augmentin have been [facility].

## 2013-03-18 ENCOUNTER — Encounter (HOSPITAL_COMMUNITY): Payer: Medicare Other

## 2013-03-18 DIAGNOSIS — M19019 Primary osteoarthritis, unspecified shoulder: Secondary | ICD-10-CM | POA: Diagnosis not present

## 2013-03-18 DIAGNOSIS — M25559 Pain in unspecified hip: Secondary | ICD-10-CM | POA: Diagnosis not present

## 2013-03-22 ENCOUNTER — Telehealth: Payer: Self-pay | Admitting: Family Medicine

## 2013-03-22 ENCOUNTER — Other Ambulatory Visit: Payer: Self-pay | Admitting: *Deleted

## 2013-03-22 DIAGNOSIS — K219 Gastro-esophageal reflux disease without esophagitis: Secondary | ICD-10-CM

## 2013-03-22 MED ORDER — OMEPRAZOLE 20 MG PO CPDR: 20.0000 mg | DELAYED_RELEASE_CAPSULE | Freq: Every day | ORAL | Status: DC

## 2013-03-22 NOTE — Telephone Encounter (Signed)
Needs a prescription for Heart Burn 90 day supply.  [facility]  Call patient when ready

## 2013-03-22 NOTE — Telephone Encounter (Signed)
Omeprazole 20mg  #90 one refill sent to [facility]. Pt notified on voicemail

## 2013-03-24 ENCOUNTER — Other Ambulatory Visit (HOSPITAL_COMMUNITY): Payer: Self-pay | Admitting: Oncology

## 2013-03-24 DIAGNOSIS — C9000 Multiple myeloma not having achieved remission: Secondary | ICD-10-CM

## 2013-03-24 MED ORDER — LENALIDOMIDE 10 MG PO CAPS
10.0000 mg | ORAL_CAPSULE | Freq: Every day | ORAL | Status: DC
Start: 2013-03-24 — End: 2013-04-28

## 2013-03-27 ENCOUNTER — Encounter: Payer: Self-pay | Admitting: Oncology

## 2013-04-02 ENCOUNTER — Ambulatory Visit (HOSPITAL_COMMUNITY)
Admission: RE | Admit: 2013-04-02 | Discharge: 2013-04-02 | Disposition: A | Discharge status: Home / Self Care | Payer: Medicare Other | Source: Ambulatory Visit | Attending: Family Medicine | Admitting: Family Medicine

## 2013-04-02 ENCOUNTER — Ambulatory Visit (INDEPENDENT_AMBULATORY_CARE_PROVIDER_SITE_OTHER): Payer: Medicare Other | Admitting: Family Medicine

## 2013-04-02 ENCOUNTER — Encounter: Payer: Self-pay | Admitting: Family Medicine

## 2013-04-02 ENCOUNTER — Other Ambulatory Visit (HOSPITAL_COMMUNITY): Payer: Medicare Other

## 2013-04-02 ENCOUNTER — Other Ambulatory Visit (HOSPITAL_COMMUNITY): Payer: Self-pay | Admitting: Oncology

## 2013-04-02 VITALS — BP 148/90 | HR 70 | Wt 187.0 lb

## 2013-04-02 DIAGNOSIS — J449 Chronic obstructive pulmonary disease, unspecified: Secondary | ICD-10-CM | POA: Insufficient documentation

## 2013-04-02 DIAGNOSIS — J9819 Other pulmonary collapse: Secondary | ICD-10-CM | POA: Diagnosis not present

## 2013-04-02 DIAGNOSIS — E876 Hypokalemia: Secondary | ICD-10-CM

## 2013-04-02 DIAGNOSIS — Z8546 Personal history of malignant neoplasm of prostate: Secondary | ICD-10-CM | POA: Diagnosis not present

## 2013-04-02 DIAGNOSIS — R079 Chest pain, unspecified: Secondary | ICD-10-CM | POA: Insufficient documentation

## 2013-04-02 DIAGNOSIS — R7989 Other specified abnormal findings of blood chemistry: Secondary | ICD-10-CM | POA: Diagnosis not present

## 2013-04-02 DIAGNOSIS — I10 Essential (primary) hypertension: Secondary | ICD-10-CM | POA: Diagnosis not present

## 2013-04-02 DIAGNOSIS — R0602 Shortness of breath: Secondary | ICD-10-CM | POA: Insufficient documentation

## 2013-04-02 DIAGNOSIS — C9000 Multiple myeloma not having achieved remission: Secondary | ICD-10-CM

## 2013-04-02 DIAGNOSIS — J4489 Other specified chronic obstructive pulmonary disease: Secondary | ICD-10-CM | POA: Insufficient documentation

## 2013-04-02 LAB — BASIC METABOLIC PANEL
Chloride: 101 mEq/L (ref 96–112)
Creat: 0.77 mg/dL (ref 0.50–1.35)
Potassium: 4.1 mEq/L (ref 3.5–5.3)

## 2013-04-02 LAB — CBC WITH DIFFERENTIAL/PLATELET
Basophils Absolute: 0 10*3/uL (ref 0.0–0.1)
Lymphocytes Relative: 20 % (ref 12–46)
Neutro Abs: 3.4 10*3/uL (ref 1.7–7.7)
Neutrophils Relative %: 63 % (ref 43–77)
Platelets: 116 10*3/uL — ABNORMAL LOW (ref 150–400)
RDW: 14.9 % (ref 11.5–15.5)
WBC: 5.5 10*3/uL (ref 4.0–10.5)

## 2013-04-02 LAB — TROPONIN I: Troponin I: <0.3 ng/mL (ref <0.30)

## 2013-04-02 LAB — CK TOTAL AND CKMB (NOT AT ARMC): Total CK: 46 U/L (ref 7–232)

## 2013-04-02 MED ORDER — HEPARIN SOD (PORK) LOCK FLUSH 100 UNIT/ML IV SOLN
INTRAVENOUS | Status: AC
  Filled 2013-04-02: qty 5

## 2013-04-02 MED ORDER — POTASSIUM CHLORIDE CRYS ER 20 MEQ PO TBCR: 20.0000 meq | EXTENDED_RELEASE_TABLET | Freq: Three times a day (TID) | ORAL | Status: DC

## 2013-04-02 MED ORDER — CHLORZOXAZONE 500 MG PO TABS: 500.0000 mg | ORAL_TABLET | Freq: Four times a day (QID) | ORAL | Status: DC | PRN

## 2013-04-02 MED ORDER — NITROGLYCERIN 0.4 MG SL SUBL: 0.4000 mg | SUBLINGUAL_TABLET | SUBLINGUAL | Status: DC | PRN

## 2013-04-02 MED ORDER — IOHEXOL 350 MG/ML SOLN
100.0000 mL | Freq: Once | INTRAVENOUS | Status: AC | PRN
  Administered 2013-04-02: 100 mL via INTRAVENOUS

## 2013-04-02 NOTE — Progress Notes (Signed)
  Subjective:    Patient ID: Jeremy Parrish, male    DOB: 1941-04-07, 72 y.o.   MRN: [id number]  Chest Pain  This is a new problem. The current episode started in the past 7 days. The pain is at a severity of 5/10. The quality of the pain is described as sharp. The pain radiates to the mid back. Associated symptoms include back pain. He has tried nitroglycerin for the symptoms. The treatment provided no relief.   Patient with severe sharp pains on left side of his chest hurts with a deep breath he thinks he injured it. He isn't certain denies fever chills he is worried about possibility of heart attack. He does have a history of cancer. Past medical history was reviewed in detail Family history reviewed Social doesn't smoke   Review of Systems  Cardiovascular: Positive for chest pain.  Musculoskeletal: Positive for back pain.       Objective:   Physical Exam There is no obvious sign of any stroke going on the lungs are clear hearts regular pulse normal chest wall mild tenderness left side no rash noted extremities no edema skin warm dry       Assessment & Plan:  #1 chest pain-EKG looks normal. We will order chest x-ray were also reordered some cardiac enzymes I believe these will come back normal. I am concerned about a possibility of a blood clot we'll order a d-dimer 25 minutes spent with the patient not sure much more obese but following up all these labs. He understands all of this and will get it done.  It should be noted that his lab work came back overall looking good. There is no sign of heart attack. CAT scan did not show blood clot. Patient was talked to at length.

## 2013-04-02 NOTE — Progress Notes (Signed)
20G huber needle inserted into right side port per protocol. After procedure 10mL 0.9% NS and 5mL Heparin 100u/mL instilled into port and Huber needle removed pressure dressing applied

## 2013-04-08 ENCOUNTER — Encounter (INDEPENDENT_AMBULATORY_CARE_PROVIDER_SITE_OTHER): Payer: Self-pay | Admitting: General Surgery

## 2013-04-08 ENCOUNTER — Ambulatory Visit (INDEPENDENT_AMBULATORY_CARE_PROVIDER_SITE_OTHER): Payer: Medicare Other | Admitting: General Surgery

## 2013-04-08 VITALS — BP 128/76 | HR 74 | Temp 98.0°F | Resp 18 | Ht 69.0 in | Wt 186.0 lb

## 2013-04-08 DIAGNOSIS — R1031 Right lower quadrant pain: Secondary | ICD-10-CM | POA: Diagnosis not present

## 2013-04-08 NOTE — Progress Notes (Signed)
Subjective:   right groin pain  Patient ID: Jeremy Parrish, male   DOB: 1941/03/31, 72 y.o.   MRN: [id number]  HPI Patient is a very pleasant 72 year old male known to me from previous colectomy about 10 years ago due to diverticulitis with colovesical fistula. He has multiple medical problems as below. He is here today for evaluation for right groin pain and possible hernia. He states that for about one year he has had persistent intermittent pain in his right inguinal area. The pain is related to physical activity such as bending or twisting or lifting and is relieved by rest. It does not occur every day but is fairly frequent and has been persistent for a year. He has no associated GI symptoms. He has not noted a lump or bulge. No urinary symptoms. He has had multiple cardiac catheterizations in the right groin but no history of hernia repair or other surgery.  Past Medical History  Diagnosis Date  . Hypertension   . Heart disease   . Kidney stones     history  . Lung mass   . Heart murmur   . 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 Coronary artery disease   . Depression   . Shortness of breath   . Stroke 2013  . Peripheral arterial disease   . Bladder neck contracture   . Personal history of other diseases of circulatory system   . Aortic aneurysm of unspecified site without mention of rupture   . Arthritis   . Intestinovesical fistula   . Esophageal reflux   . Hyperlipidemia   . Anemia   . CHF (congestive heart failure)   . COPD (chronic obstructive pulmonary disease)   . Myocardial infarction   . Cancer   . Prostate cancer 2000  . Multiple myeloma   . Hx of bladder cancer 10/07/2012  . Non Hodgkin's lymphoma    Past Surgical History  Procedure Laterality Date  . Prostate surgery    . Heart stents x 5    . Portacath placement    . Wrist surgery      right  . Left ear skin cancer removed    . Bone marrow  transplant    . Pacemaker insertion    . Coronary angioplasty    . Insert / replace / remove pacemaker    . Tee without cardioversion  10/13/2012    Procedure: TRANSESOPHAGEAL ECHOCARDIOGRAM (TEE);  Surgeon: Thurmon Fair, MD;  Location: [facility] ENDOSCOPY;  Service: Cardiovascular;  Laterality: N/A;  pat/kay/echo notified  . Colonoscopy N/A 01/01/2013    Procedure: COLONOSCOPY;  Surgeon: Malissa Hippo, MD;  Location: [facility] ENDO SUITE;  Service: Endoscopy;  Laterality: N/A;  825-moved to 940 Ann notified pt   Current Outpatient Prescriptions  Medication Sig Dispense Refill  . amLODipine (NORVASC) 10 MG tablet Take 10 mg by mouth daily.        Marland Kitchen aspirin 81 MG tablet Take 81 mg by mouth daily.      . citalopram (CELEXA) 40 MG tablet Take 40 mg by mouth daily.        . clopidogrel (PLAVIX) 75 MG tablet Take 75 mg by mouth daily.       Marland Kitchen gabapentin (NEURONTIN) 600 MG tablet Take 600-1,200 mg by mouth 4 (four) times daily. Takes 1 tablet three times a day      . lenalidomide (REVLIMID) 10 MG capsule Take 1 capsule (10 mg total) by  mouth daily. Take 1 capsule by mouth daily for 7 days on followed by 7 days off  14 capsule  1  . nitroGLYCERIN (NITROSTAT) 0.4 MG SL tablet Place 1 tablet (0.4 mg total) under the tongue every 5 (five) minutes as needed for chest pain.  25 tablet  12  . omeprazole (PRILOSEC) 20 MG capsule Take 1 capsule (20 mg total) by mouth daily.  90 capsule  1  . oxyCODONE-acetaminophen (PERCOCET/ROXICET) 5-325 MG per tablet Take 1 tablet by mouth every 6 (six) hours as needed for pain.  60 tablet  0  . potassium chloride SA (KLOR-CON M20) 20 MEQ tablet Take 1 tablet (20 mEq total) by mouth 3 (three) times daily.  90 tablet  1  . simvastatin (ZOCOR) 20 MG tablet Take 20 mg by mouth at bedtime.        Marland Kitchen acyclovir (ZOVIRAX) 400 MG tablet       . chlorzoxazone (PARAFON) 500 MG tablet Take 1 tablet (500 mg total) by mouth 4 (four) times daily as needed for muscle spasms.  30 tablet  1  .  loratadine-pseudoephedrine (CLARITIN-D 24 HOUR) 10-240 MG per 24 hr tablet Take 1 tablet by mouth daily.  30 tablet  3   No current facility-administered medications for this visit.   Allergies  Allergen Reactions  . Diphenhydramine Hcl     REACTION: "hyper"  . Morphine And Related Other (See Comments)    hallucinations   History  Substance Use Topics  . Smoking status: Former Smoker -- 1.00 packs/day for 20 years    Types: Cigarettes    Quit date: 11/14/1994  . Smokeless tobacco: Never Used  . Alcohol Use: No     Comment: previously drank but none for at least 15 years.     Review of Systems  Constitutional: Negative for fever and chills.  Gastrointestinal: Negative.   Genitourinary: Negative.        Objective:   Physical Exam BP 128/76  Pulse 74  Temp(Src) 98 F (36.7 C)  Resp 18  Ht 5\' 9"  (1.753 m)  Wt 186 lb (84.369 kg)  BMI 27.45 kg/m2 General: Alert pleasant Caucasian male in no distress Skin: Warm and dry without rash infection Lungs: Clear equal breath sounds without increased work of breathing Cardiac: Regular rate and rhythm without murmurs Abdomen: Well-healed midline incision. Moderately obese. Soft and nontender. No incisional hernias. With very careful palpation of the right groin with the patient coughing and standing and doing Valsalva maneuvers both on the internal and external exam I cannot feel any evidence of an inguinal hernia. GU: Normal male no testicular masses or tenderness Extremities: No edema or deformity Neurologic: Alert and fully oriented. Dysarthric speech. No gross motor or sensory deficits.    Assessment:     Persistent right groin pain related to activity suggestive of a hernia but I cannot demonstrate a hernia on exam. His pain seems possibly a little bit low, down around the pubic tubercle, for typical hernia pain. He has had multiple cardiac catheterizations and I suppose it is possible he could have some scarring causing some  discomfort with activity. I discussed my findings with him. I offered the possibility of a CT scan which could determine some other diagnoses such as adenopathy or might to find a small hernia on able to feel on exam. Due to the chronic nature of the discomfort he is very concerned about I would like to go ahead with this study which I think is  reasonable. I will call him with the results.    Plan:     CT scan to rule out small undetectable hernia or other cause for right groin pain. If this is negative I do not have other recommendations at this time other than watchful waiting.

## 2013-04-12 ENCOUNTER — Encounter (HOSPITAL_COMMUNITY): Payer: Self-pay

## 2013-04-12 ENCOUNTER — Ambulatory Visit (HOSPITAL_COMMUNITY)
Admission: RE | Admit: 2013-04-12 | Discharge: 2013-04-12 | Disposition: A | Discharge status: Home / Self Care | Payer: Medicare Other | Source: Ambulatory Visit | Attending: General Surgery | Admitting: General Surgery

## 2013-04-12 DIAGNOSIS — Z9481 Bone marrow transplant status: Secondary | ICD-10-CM | POA: Diagnosis not present

## 2013-04-12 DIAGNOSIS — R1031 Right lower quadrant pain: Secondary | ICD-10-CM | POA: Diagnosis not present

## 2013-04-12 DIAGNOSIS — K5710 Diverticulosis of small intestine without perforation or abscess without bleeding: Secondary | ICD-10-CM | POA: Diagnosis not present

## 2013-04-12 DIAGNOSIS — C8589 Other specified types of non-Hodgkin lymphoma, extranodal and solid organ sites: Secondary | ICD-10-CM | POA: Insufficient documentation

## 2013-04-12 DIAGNOSIS — N281 Cyst of kidney, acquired: Secondary | ICD-10-CM | POA: Diagnosis not present

## 2013-04-12 DIAGNOSIS — Q619 Cystic kidney disease, unspecified: Secondary | ICD-10-CM | POA: Insufficient documentation

## 2013-04-12 DIAGNOSIS — I10 Essential (primary) hypertension: Secondary | ICD-10-CM | POA: Diagnosis not present

## 2013-04-12 MED ORDER — IOHEXOL 300 MG/ML  SOLN
100.0000 mL | Freq: Once | INTRAMUSCULAR | Status: AC | PRN
  Administered 2013-04-12: 100 mL via INTRAVENOUS

## 2013-04-12 MED ORDER — HEPARIN SOD (PORK) LOCK FLUSH 100 UNIT/ML IV SOLN
INTRAVENOUS | Status: AC
  Filled 2013-04-12: qty 5

## 2013-04-12 NOTE — Progress Notes (Signed)
19G Huber needle inserted into right side chest port       After procedure Normal Saline 0.9% 10mL and Heparin 500u-100u/mL instilled into port

## 2013-04-13 ENCOUNTER — Encounter (HOSPITAL_COMMUNITY): Payer: Medicare Other | Attending: Oncology

## 2013-04-13 DIAGNOSIS — C9000 Multiple myeloma not having achieved remission: Secondary | ICD-10-CM | POA: Diagnosis not present

## 2013-04-13 DIAGNOSIS — C8589 Other specified types of non-Hodgkin lymphoma, extranodal and solid organ sites: Secondary | ICD-10-CM | POA: Diagnosis not present

## 2013-04-13 LAB — COMPREHENSIVE METABOLIC PANEL
ALT: 19 U/L (ref 0–53)
AST: 25 U/L (ref 0–37)
Albumin: 3.4 g/dL — ABNORMAL LOW (ref 3.5–5.2)
CO2: 26 mEq/L (ref 19–32)
Calcium: 9.3 mg/dL (ref 8.4–10.5)
Sodium: 139 mEq/L (ref 135–145)
Total Protein: 7 g/dL (ref 6.0–8.3)

## 2013-04-13 NOTE — Progress Notes (Signed)
Labs drawn today for cmp 

## 2013-04-14 ENCOUNTER — Encounter (HOSPITAL_BASED_OUTPATIENT_CLINIC_OR_DEPARTMENT_OTHER): Payer: Medicare Other

## 2013-04-14 VITALS — BP 125/81 | HR 83 | Temp 97.2°F | Resp 18

## 2013-04-14 DIAGNOSIS — C9000 Multiple myeloma not having achieved remission: Secondary | ICD-10-CM | POA: Diagnosis not present

## 2013-04-14 DIAGNOSIS — C8589 Other specified types of non-Hodgkin lymphoma, extranodal and solid organ sites: Secondary | ICD-10-CM

## 2013-04-14 MED ORDER — ZOLEDRONIC ACID 4 MG/5ML IV CONC
4.0000 mg | Freq: Once | INTRAVENOUS | Status: AC
  Administered 2013-04-14: 4 mg via INTRAVENOUS
  Filled 2013-04-14: qty 5

## 2013-04-14 MED ORDER — HEPARIN SOD (PORK) LOCK FLUSH 100 UNIT/ML IV SOLN
500.0000 [IU] | Freq: Once | INTRAVENOUS | Status: AC | PRN
  Administered 2013-04-14: 500 [IU]
  Filled 2013-04-14: qty 5

## 2013-04-14 MED ORDER — SODIUM CHLORIDE 0.9 % IJ SOLN
10.0000 mL | INTRAMUSCULAR | Status: DC | PRN
  Administered 2013-04-14: 10 mL
  Filled 2013-04-14: qty 10

## 2013-04-14 MED ORDER — SODIUM CHLORIDE 0.9 % IV SOLN
Freq: Once | INTRAVENOUS | Status: AC
  Administered 2013-04-14: 10:00:00 via INTRAVENOUS

## 2013-04-14 MED ORDER — HEPARIN SOD (PORK) LOCK FLUSH 100 UNIT/ML IV SOLN
INTRAVENOUS | Status: AC
  Filled 2013-04-14: qty 5

## 2013-04-14 NOTE — Progress Notes (Signed)
Tolerated well

## 2013-04-19 DIAGNOSIS — M25519 Pain in unspecified shoulder: Secondary | ICD-10-CM | POA: Diagnosis not present

## 2013-04-19 DIAGNOSIS — M67919 Unspecified disorder of synovium and tendon, unspecified shoulder: Secondary | ICD-10-CM | POA: Diagnosis not present

## 2013-04-20 ENCOUNTER — Telehealth: Payer: Self-pay | Admitting: Cardiovascular Disease

## 2013-04-20 NOTE — Telephone Encounter (Signed)
Jeremy Parrish called for Dr. Royann Shivers who had put his pacemaker in. He is going to have a procedure and wants to know if it is okay to have an MRI and CT scan with his pacemaker.  Please call.  ST.

## 2013-04-20 NOTE — Telephone Encounter (Signed)
Jeremy Parrish called for Dr. Croitoru who had put his pacemaker in. He is going to have a procedure and wants to know if it is okay to have an MRI and CT scan with his pacemaker.  Please call.  ST.  °

## 2013-04-21 ENCOUNTER — Telehealth: Payer: Self-pay | Admitting: Cardiovascular Disease

## 2013-04-21 ENCOUNTER — Telehealth: Payer: Self-pay | Admitting: *Deleted

## 2013-04-21 NOTE — Telephone Encounter (Signed)
Wants to know if it is all right  For him to have a CT Scan since he has a pacemaker?

## 2013-04-21 NOTE — Telephone Encounter (Signed)
Jeremy Parrish called and wants to know what kind of pacemaker he has.  He has a procedure coming up and wants to know if he can have an MRI and CT scan.

## 2013-04-21 NOTE — Telephone Encounter (Signed)
Message has already been forwarded to S. Lelon Perla, CMA to review and contact pt.

## 2013-04-21 NOTE — Telephone Encounter (Signed)
Patient was informed that his Revo pacemaker is MRI compatible. Patient stated that he would get the Dr's ofc to contact us if they needed any further information about his device.

## 2013-04-21 NOTE — Telephone Encounter (Signed)
The following message was received by S. Trigloff in Medical Records:   Jeremy Parrish called and wants to know what kind of pacemaker he has. He has a procedure coming up and wants to know if he can have an MRI and CT scan.       Message forwarded to S. Lelon Perla, CMA r/t device/monitor concerns.  Last OV note on triage cart if needed.

## 2013-04-28 ENCOUNTER — Telehealth (INDEPENDENT_AMBULATORY_CARE_PROVIDER_SITE_OTHER): Payer: Self-pay

## 2013-04-28 ENCOUNTER — Other Ambulatory Visit (HOSPITAL_COMMUNITY): Payer: Self-pay | Admitting: Oncology

## 2013-04-28 DIAGNOSIS — C9000 Multiple myeloma not having achieved remission: Secondary | ICD-10-CM

## 2013-04-28 MED ORDER — LENALIDOMIDE 10 MG PO CAPS: 10.0000 mg | ORAL_CAPSULE | Freq: Every day | ORAL | Status: DC

## 2013-04-28 NOTE — Telephone Encounter (Signed)
Called patient with CT results.  Patient scheduled for office visit on 04/30/13 @ 2:15 pm w/Dr. Johna Sheriff.

## 2013-04-28 NOTE — Telephone Encounter (Signed)
Message copied by Maryan Puls on Wed Apr 28, 2013  9:35 AM ------      Message from: Glenna Fellows T      Created: Mon Apr 19, 2013  3:19 PM       Please call patient "CT scan did show a tiny inguinal hernia of questionable significance.  Should get back to the office for re examination and discussion" ------

## 2013-04-30 ENCOUNTER — Ambulatory Visit (INDEPENDENT_AMBULATORY_CARE_PROVIDER_SITE_OTHER): Payer: Medicare Other | Admitting: General Surgery

## 2013-04-30 ENCOUNTER — Encounter (INDEPENDENT_AMBULATORY_CARE_PROVIDER_SITE_OTHER): Payer: Self-pay | Admitting: General Surgery

## 2013-04-30 VITALS — BP 130/80 | HR 64 | Temp 97.9°F | Resp 15 | Ht 68.0 in | Wt 187.8 lb

## 2013-04-30 DIAGNOSIS — R1031 Right lower quadrant pain: Secondary | ICD-10-CM

## 2013-04-30 DIAGNOSIS — K4090 Unilateral inguinal hernia, without obstruction or gangrene, not specified as recurrent: Secondary | ICD-10-CM

## 2013-04-30 NOTE — Progress Notes (Signed)
Chief complaint: Followup right groin pain  History: Patient returns for followup of his right groin pain. We obtained a CT scan to evaluate as I was not able to feel a hernia. I reviewed the scan personally and interestingly this does show a small fat-containing right inguinal hernia. There are no other abnormalities to explain his pain on the CT scan.  Exam: Again examined the patient today with coughing and straining there is some tenderness in the right inguinal canal but I cannot feel a definite hernia  Assessment plan: Small right inguinal hernia by CT scan. I think is quite possibly explains his pain. I discussed this with him. I discussed options with him which include proceeding with repair with I think a pretty good chance of improving his pain or observation. At this point he seems happy to at least have a diagnosis and feels that the pain is only intermittent and not severe enough at this point that he would want to have an operation. As his hernia is very small and only seen by CT scan I think this is very reasonable. I encouraged him to call me at any point if the pain worsens or if he feels that he would like to have this repaired.

## 2013-05-10 ENCOUNTER — Telehealth: Payer: Self-pay

## 2013-05-10 DIAGNOSIS — M19019 Primary osteoarthritis, unspecified shoulder: Secondary | ICD-10-CM | POA: Diagnosis not present

## 2013-05-10 DIAGNOSIS — M75120 Complete rotator cuff tear or rupture of unspecified shoulder, not specified as traumatic: Secondary | ICD-10-CM | POA: Diagnosis not present

## 2013-05-10 NOTE — Telephone Encounter (Signed)
Patient came in to get advice and to be proactive in preventing another stroke. He has felt as though he has "been having small strokes" over the past three weeks. He feels okay right now. He does not have an appointment with Dr. Pearlean Brownie until August and would like to try to prevent another "major stroke." I reviewed his symptoms. He reports having increased slurring and increased difficulty in finding words and enunciation. He has not changes in fine or gross motor skills. He has no changes in his vision, per patient report. I advised him that if he thougt he was having a stroke or someone observed him having stroke like symptoms, he should go to the Emergency Room. I will see if we can get him in with Dr. Pearlean Brownie sooner than August. He does not want to see the Nurse Practitioner. Please advise. Marland Kitchen

## 2013-05-11 DIAGNOSIS — D485 Neoplasm of uncertain behavior of skin: Secondary | ICD-10-CM | POA: Diagnosis not present

## 2013-05-11 DIAGNOSIS — L570 Actinic keratosis: Secondary | ICD-10-CM | POA: Diagnosis not present

## 2013-05-11 DIAGNOSIS — C44621 Squamous cell carcinoma of skin of unspecified upper limb, including shoulder: Secondary | ICD-10-CM | POA: Diagnosis not present

## 2013-05-12 NOTE — Telephone Encounter (Signed)
Agree with sooner work in visit with me

## 2013-05-17 ENCOUNTER — Encounter (HOSPITAL_COMMUNITY): Payer: Medicare Other | Attending: Oncology

## 2013-05-17 DIAGNOSIS — C9000 Multiple myeloma not having achieved remission: Secondary | ICD-10-CM | POA: Insufficient documentation

## 2013-05-17 DIAGNOSIS — C8589 Other specified types of non-Hodgkin lymphoma, extranodal and solid organ sites: Secondary | ICD-10-CM | POA: Diagnosis not present

## 2013-05-17 LAB — CBC WITH DIFFERENTIAL/PLATELET
Basophils Relative: 1 % (ref 0–1)
Eosinophils Absolute: 0.2 10*3/uL (ref 0.0–0.7)
HCT: 37 % — ABNORMAL LOW (ref 39.0–52.0)
Hemoglobin: 12.3 g/dL — ABNORMAL LOW (ref 13.0–17.0)
Lymphs Abs: 1.7 10*3/uL (ref 0.7–4.0)
MCH: 30.6 pg (ref 26.0–34.0)
MCHC: 33.2 g/dL (ref 30.0–36.0)
MCV: 92 fL (ref 78.0–100.0)
Monocytes Absolute: 0.6 10*3/uL (ref 0.1–1.0)
Monocytes Relative: 15 % — ABNORMAL HIGH (ref 3–12)
RBC: 4.02 MIL/uL — ABNORMAL LOW (ref 4.22–5.81)

## 2013-05-17 LAB — COMPREHENSIVE METABOLIC PANEL
ALT: 12 U/L (ref 0–53)
Alkaline Phosphatase: 84 U/L (ref 39–117)
BUN: 10 mg/dL (ref 6–23)
CO2: 26 mEq/L (ref 19–32)
GFR calc Af Amer: >90 mL/min (ref >90)
GFR calc non Af Amer: 88 mL/min — ABNORMAL LOW (ref >90)
Glucose, Bld: 104 mg/dL — ABNORMAL HIGH (ref 70–99)
Potassium: 4.1 mEq/L (ref 3.5–5.1)
Sodium: 138 mEq/L (ref 135–145)

## 2013-05-17 NOTE — Progress Notes (Signed)
Labs drawn today for cbc/diff,cmp,kllc,ldh,mm panel

## 2013-05-18 LAB — KAPPA/LAMBDA LIGHT CHAINS: Kappa free light chain: 2.25 mg/dL — ABNORMAL HIGH (ref 0.33–1.94)

## 2013-05-18 NOTE — Telephone Encounter (Signed)
I had reviewed this with Dr. Marlis Edelson Medical Tech.. She looked for an appointment but said there were none available sooner. I called and notified patient. I let him know we would contact him if an opening occurred. He is to go to the hospital if he is having stroke like symptoms and continue to take his medications as ordered. Patient stated that he understood.

## 2013-05-18 NOTE — Telephone Encounter (Signed)
I called pt and made appt for him with Heide Guile, NP at 06-08-13 at 1400.  He verbalized understanding.

## 2013-05-19 ENCOUNTER — Other Ambulatory Visit (HOSPITAL_COMMUNITY): Payer: Self-pay | Admitting: Oncology

## 2013-05-19 ENCOUNTER — Encounter (HOSPITAL_BASED_OUTPATIENT_CLINIC_OR_DEPARTMENT_OTHER): Payer: Medicare Other

## 2013-05-19 ENCOUNTER — Other Ambulatory Visit (HOSPITAL_COMMUNITY): Payer: Self-pay | Admitting: Orthopedic Surgery

## 2013-05-19 VITALS — BP 139/77 | HR 75 | Temp 97.3°F | Resp 18 | Wt 188.0 lb

## 2013-05-19 DIAGNOSIS — C9000 Multiple myeloma not having achieved remission: Secondary | ICD-10-CM | POA: Diagnosis not present

## 2013-05-19 DIAGNOSIS — C8589 Other specified types of non-Hodgkin lymphoma, extranodal and solid organ sites: Secondary | ICD-10-CM

## 2013-05-19 DIAGNOSIS — M810 Age-related osteoporosis without current pathological fracture: Secondary | ICD-10-CM

## 2013-05-19 DIAGNOSIS — R52 Pain, unspecified: Secondary | ICD-10-CM

## 2013-05-19 DIAGNOSIS — R531 Weakness: Secondary | ICD-10-CM

## 2013-05-19 LAB — MULTIPLE MYELOMA PANEL, SERUM
Alpha-1-Globulin: 5.9 % — ABNORMAL HIGH (ref 2.9–4.9)
Alpha-2-Globulin: 12.3 % — ABNORMAL HIGH (ref 7.1–11.8)
Beta 2: 4.7 % (ref 3.2–6.5)
Beta Globulin: 6.8 % (ref 4.7–7.2)
Gamma Globulin: 11.2 % (ref 11.1–18.8)

## 2013-05-19 MED ORDER — ZOLEDRONIC ACID 4 MG/5ML IV CONC
4.0000 mg | Freq: Once | INTRAVENOUS | Status: AC
  Administered 2013-05-19: 4 mg via INTRAVENOUS
  Filled 2013-05-19: qty 5

## 2013-05-19 MED ORDER — LENALIDOMIDE 10 MG PO CAPS: 10.0000 mg | ORAL_CAPSULE | Freq: Every day | ORAL | Status: DC

## 2013-05-19 MED ORDER — SODIUM CHLORIDE 0.9 % IV SOLN
Freq: Once | INTRAVENOUS | Status: AC
  Administered 2013-05-19: 11:00:00 via INTRAVENOUS

## 2013-05-19 MED ORDER — SODIUM CHLORIDE 0.9 % IJ SOLN
10.0000 mL | INTRAMUSCULAR | Status: DC | PRN
  Filled 2013-05-19: qty 10

## 2013-05-19 MED ORDER — HEPARIN SOD (PORK) LOCK FLUSH 100 UNIT/ML IV SOLN
500.0000 [IU] | Freq: Once | INTRAVENOUS | Status: AC | PRN
  Administered 2013-05-19: 500 [IU]
  Filled 2013-05-19: qty 5

## 2013-05-19 MED ORDER — HEPARIN SOD (PORK) LOCK FLUSH 100 UNIT/ML IV SOLN
INTRAVENOUS | Status: AC
  Filled 2013-05-19: qty 5

## 2013-05-19 NOTE — Patient Instructions (Addendum)
.  [facility] Discharge Instructions  RECOMMENDATIONS MADE BY THE CONSULTANT AND ANY TEST RESULTS WILL BE SENT TO YOUR REFERRING PHYSICIAN.  EXAM FINDINGS BY THE PHYSICIAN TODAY AND SIGNS OR SYMPTOMS TO REPORT TO CLINIC OR PRIMARY PHYSICIAN: Exam good  MEDICATIONS PRESCRIBED:  Continue revalimid as you are doing  INSTRUCTIONS GIVEN AND DISCUSSED: zometa and labs monthly  SPECIAL INSTRUCTIONS/FOLLOW-UP: 3 months to see Tom  Thank you for choosing [facility] to provide your oncology and hematology care.  To afford each patient quality time with our providers, please arrive at least 15 minutes before your scheduled appointment time.  With your help, our goal is to use those 15 minutes to complete the necessary work-up to ensure our physicians have the information they need to help with your evaluation and healthcare recommendations.    Effective January 1st, 2014, we ask that you re-schedule your appointment with our physicians should you arrive 10 or more minutes late for your appointment.  We strive to give you quality time with our providers, and arriving late affects you and other patients whose appointments are after yours.    Again, thank you for choosing [facility].  Our hope is that these requests will decrease the amount of time that you wait before being seen by our physicians.       _____________________________________________________________  Should you have questions after your visit to [facility], please contact our office at [phone] between the hours of 8:30 a.m. and 5:00 p.m.  Voicemails left after 4:30 p.m. will not be returned until the following business day.  For prescription refill requests, have your pharmacy contact our office with your prescription refill request.

## 2013-05-21 NOTE — Progress Notes (Signed)
Jeremy Asa, MD [address] [place] [address]  LYMPHOMA - Plan: SCHEDULING COMMUNICATION, sodium chloride 0.9 % injection 10 mL, heparin lock flush 100 unit/mL, 0.9 %  sodium chloride infusion, zolendronic acid (ZOMETA) 4 mg in sodium chloride 0.9 % 100 mL IVPB, CBC with Differential  MULTIPLE  MYELOMA - Plan: SCHEDULING COMMUNICATION, sodium chloride 0.9 % injection 10 mL, heparin lock flush 100 unit/mL, 0.9 %  sodium chloride infusion, zolendronic acid (ZOMETA) 4 mg in sodium chloride 0.9 % 100 mL IVPB, CBC with Differential  CURRENT THERAPY: Revlimid 10 mg 7 days on and 7 days off  INTERVAL HISTORY: Jeremy Parrish 72 y.o. male returns for  regular  visit for followup of  IgG lambda multiple myeloma status post 4 cycles of dexamethasone and Velcade followed by bone marrow transplantation by Dr. Greggory Parrish at [facility] October 2011.  Now on maintenance Revlimid which he is tolerating well.    Jeremy Parrish is doing quite well. He tells me he has a good appetite, and is able to carry out his activities of daily living without difficulty. He does have some occasional loose stools but has not noticed any blood or other symptoms of concern. He reports that he had a colonoscopy about 2 months ago and was told that everything was good. He presents today for a scheduled followup visit and is due for his Zometa infusion today as well.   He denies any pulmonary complaints.   He continues to take Revlimid 10 mg 7 days on and 7 days off maintenance.  He is tolerating well.   Hematologically, he denies any complaints and ROS questioning is negative.   Past Medical History  Diagnosis Date  . Hypertension   . Heart disease   . Kidney stones     history  . Lung mass   . Heart murmur   . 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 Coronary artery disease   . Depression   . Shortness of breath   . Stroke 2013  . Peripheral arterial  disease   . Bladder neck contracture   . Personal history of other diseases of circulatory system   . Aortic aneurysm of unspecified site without mention of rupture   . Arthritis   . Intestinovesical fistula   . Esophageal reflux   . Hyperlipidemia   . Anemia   . CHF (congestive heart failure)   . COPD (chronic obstructive pulmonary disease)   . Myocardial infarction   . Cancer   . Prostate cancer 2000  . Multiple myeloma   . Hx of bladder cancer 10/07/2012  . Non Hodgkin's lymphoma     has CANDIDIASIS, ORAL; LYMPHOMA; MULTIPLE  MYELOMA; ANXIETY; HYPERTENSION; MYOCARDIAL INFARCTION; CAD, dating to 1996 with multiple PCIs, Stents to RCA and LCX, Last cath 2007 patent stents,30% prox LAD lesion, EF 45%; ASTHMA, UNSPECIFIED; NEPHROLITHIASIS; COUGH; ELEVATED PROSTATE SPECIFIC ANTIGEN; Nonspecific (abnormal) findings on radiological and other examination of body structure; ROTATOR CUFF REPAIR, RIGHT, HX OF; ABNORMAL LUNG XRAY; Hypogammaglobulinemia; Cerebral embolism with cerebral infarction; [place]; H/O cardiac pacemaker, [employer] REVO, MRI conditional device, 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     is allergic to diphenhydramine hcl and morphine and related.  Jeremy Parrish does not currently have medications on file.  Past Surgical History  Procedure Laterality Date  . Prostate surgery    . Heart stents x 5    . Portacath placement    . Wrist surgery      right  . Left ear skin cancer removed    . Bone marrow transplant    . Pacemaker insertion    . Coronary angioplasty    . Insert / replace / remove pacemaker    . Tee without cardioversion  10/13/2012    Procedure: TRANSESOPHAGEAL ECHOCARDIOGRAM (TEE);  Surgeon: Jeremy Fair, MD;  Location: [facility] ENDOSCOPY;  Service: Cardiovascular;   Laterality: N/A;  pat/kay/echo notified  . Colonoscopy N/A 01/01/2013    Procedure: COLONOSCOPY;  Surgeon: Jeremy Hippo, MD;  Location: [facility] ENDO SUITE;  Service: Endoscopy;  Laterality: N/A;  825-moved to 940 Ann notified pt    Denies any headaches, dizziness, double vision, fevers, chills, night sweats, nausea, vomiting, diarrhea, constipation, chest pain, heart palpitations, shortness of breath, blood in stool, black tarry stool, urinary pain, urinary burning, urinary frequency, hematuria.   PHYSICAL EXAMINATION  ECOG PERFORMANCE STATUS: 1 - Symptomatic but completely ambulatory  Filed Vitals:   05/19/13 1028  BP: 139/77  Pulse: 75  Temp: 97.3 F (36.3 C)  Resp: 18    GENERAL:alert, no distress, well nourished, well developed, comfortable, cooperative and smiling SKIN: skin color, texture, turgor are normal, no rashes or significant lesions HEAD: Normocephalic, No masses, lesions, tenderness or abnormalities.  EYES: normal, Conjunctiva are pink and non-injected EARS: External ears normal OROPHARYNX:mucous membranes are moist  NECK: supple, no adenopathy, thyroid normal size, non-tender, without nodularity, no stridor, non-tender, trachea midline LYMPH:  no palpable lymphadenopathy BREAST:not examined LUNGS: clear to auscultation and percussion HEART: regular rate & rhythm, no murmurs, no gallops, S1 normal and S2 normal ABDOMEN:abdomen soft, non-tender, obese and normal bowel sounds BACK: Back symmetric, no curvature., No CVA tenderness EXTREMITIES:less then 2 second capillary refill, no joint deformities, effusion, or inflammation, no edema, no skin discoloration, no clubbing, no cyanosis  NEURO: alert & oriented x 3 with dysarthric speech, no focal motor/sensory deficits, gait normal.  Strength is 5+ B/L    LABORATORY DATA:  Recent Results (from the past 2160 hour(s))  MULTIPLE MYELOMA PANEL, SERUM     Status: Abnormal   Collection Time    03/09/13  9:05 AM       Result Value Range   Total Protein 5.9 (*) 6.0 - 8.3 g/dL   Albumin ELP 84.6 (*) 55.8 - 66.1 %   Alpha-1-Globulin 7.3 (*) 2.9 - 4.9 %   Alpha-2-Globulin 16.2 (*) 7.1 - 11.8 %   Beta Globulin 6.2  4.7 - 7.2 %   Beta 2 6.1  3.2 - 6.5 %   Gamma Globulin 11.3  11.1 - 18.8 %   M-Spike, % NOT DETECTED     SPE Interp. (NOTE)     Comment: The possibility of a faint restricted band(s) cannot be completely     excluded in the gamma region.     Results are consistent with SPE performed on 02/09/13     Reviewed by Nolberto Hanlon C. Mammarappallil MD (Electronic Signature on File)   Comment (NOTE)     Comment: ---------------     Serum protein electrophoresis is a useful screening procedure in the     detection of various pathophysiologic states such as inflammation,     gammopathies, protein loss and other dysproteinemias.  Immunofixation     electrophoresis (IFE) is a more sensitive technique for the  identification of M-proteins found in patients with monoclonal     gammopathy of unknown significance (MGUS), amyloidosis, early or     treated myeloma or macroglobulinemia, solitary plasmacytoma or     extramedullary plasmacytoma.   IgG (Immunoglobin G), Serum 698  650 - 1600 mg/dL   IgA 782  68 - 956 mg/dL   IgM, Serum 18 (*) 41 - 251 mg/dL   Immunofix Electr Int (NOTE)     Comment: Slight area of restriction in LAMBDA lane without IgG, IgA, IgM,     IgD, or IgE heavy chain detected.  Weak monoclonal free Lambda light     chain is not excluded.  Suggest repeat in 6-8 months, if clinically     indicated.     Reviewed by Nehemiah Massed Mammarappallil MD (Electronic Signature on File)  LACTATE DEHYDROGENASE     Status: None   Collection Time    03/09/13  9:05 AM      Result Value Range   LDH 190  94 - 250 U/L  KAPPA/LAMBDA LIGHT CHAINS     Status: None   Collection Time    03/09/13  9:05 AM      Result Value Range   Kappa free light chain 1.76  0.33 - 1.94 mg/dL   Lamda free light chains 1.99  0.57 -  2.63 mg/dL   Kappa, lamda light chain ratio 0.88  0.26 - 1.65  COMPREHENSIVE METABOLIC PANEL     Status: Abnormal   Collection Time    03/09/13  9:05 AM      Result Value Range   Sodium 136  135 - 145 mEq/L   Potassium 4.1  3.5 - 5.1 mEq/L   Chloride 104  96 - 112 mEq/L   CO2 22  19 - 32 mEq/L   Glucose, Bld 105 (*) 70 - 99 mg/dL   BUN 21  6 - 23 mg/dL   Creatinine, Ser 2.13  0.50 - 1.35 mg/dL   Calcium 9.5  8.4 - 08.6 mg/dL   Total Protein 6.5  6.0 - 8.3 g/dL   Albumin 3.0 (*) 3.5 - 5.2 g/dL   AST 18  0 - 37 U/L   ALT 19  0 - 53 U/L   Alkaline Phosphatase 73  39 - 117 U/L   Total Bilirubin 0.4  0.3 - 1.2 mg/dL   GFR calc non Af Amer 88 (*) >90 mL/min   GFR calc Af Amer >90  >90 mL/min   Comment:            The eGFR has been calculated     using the CKD EPI equation.     This calculation has not been     validated in all clinical     situations.     eGFR's persistently     <90 mL/min signify     possible Chronic Kidney Disease.  CBC WITH DIFFERENTIAL     Status: Abnormal   Collection Time    03/09/13  9:05 AM      Result Value Range   WBC 5.7  4.0 - 10.5 K/uL   RBC 3.88 (*) 4.22 - 5.81 MIL/uL   Hemoglobin 11.9 (*) 13.0 - 17.0 g/dL   HCT 57.8 (*) 46.9 - 62.9 %   MCV 91.0  78.0 - 100.0 fL   MCH 30.7  26.0 - 34.0 pg   MCHC 33.7  30.0 - 36.0 g/dL   RDW 52.8  41.3 - 24.4 %   Platelets  114 (*) 150 - 400 K/uL   Neutrophils Relative % 74  43 - 77 %   Neutro Abs 4.2  1.7 - 7.7 K/uL   Lymphocytes Relative 15  12 - 46 %   Lymphs Abs 0.8  0.7 - 4.0 K/uL   Monocytes Relative 8  3 - 12 %   Monocytes Absolute 0.5  0.1 - 1.0 K/uL   Eosinophils Relative 3  0 - 5 %   Eosinophils Absolute 0.1  0.0 - 0.7 K/uL   Basophils Relative 0  0 - 1 %   Basophils Absolute 0.0  0.0 - 0.1 K/uL  BASIC METABOLIC PANEL     Status: Abnormal   Collection Time    03/15/13 11:12 AM      Result Value Range   Sodium 138  135 - 145 mEq/L   Potassium 3.8  3.5 - 5.1 mEq/L   Chloride 106  96 - 112  mEq/L   CO2 22  19 - 32 mEq/L   Glucose, Bld 91  70 - 99 mg/dL   BUN 15  6 - 23 mg/dL   Creatinine, Ser 6.96  0.50 - 1.35 mg/dL   Calcium 9.2  8.4 - 29.5 mg/dL   GFR calc non Af Amer 84 (*) >90 mL/min   GFR calc Af Amer >90  >90 mL/min   Comment:            The eGFR has been calculated     using the CKD EPI equation.     This calculation has not been     validated in all clinical     situations.     eGFR's persistently     <90 mL/min signify     possible Chronic Kidney Disease.  CBC WITH DIFFERENTIAL     Status: Abnormal   Collection Time    03/15/13 11:12 AM      Result Value Range   WBC 8.0  4.0 - 10.5 K/uL   RBC 4.07 (*) 4.22 - 5.81 MIL/uL   Hemoglobin 12.4 (*) 13.0 - 17.0 g/dL   HCT 28.4 (*) 13.2 - 44.0 %   MCV 91.2  78.0 - 100.0 fL   MCH 30.5  26.0 - 34.0 pg   MCHC 33.4  30.0 - 36.0 g/dL   RDW 10.2  72.5 - 36.6 %   Platelets 132 (*) 150 - 400 K/uL   Neutrophils Relative % 78 (*) 43 - 77 %   Neutro Abs 6.2  1.7 - 7.7 K/uL   Lymphocytes Relative 13  12 - 46 %   Lymphs Abs 1.1  0.7 - 4.0 K/uL   Monocytes Relative 6  3 - 12 %   Monocytes Absolute 0.5  0.1 - 1.0 K/uL   Eosinophils Relative 2  0 - 5 %   Eosinophils Absolute 0.2  0.0 - 0.7 K/uL   Basophils Relative 0  0 - 1 %   Basophils Absolute 0.0  0.0 - 0.1 K/uL  MAGNESIUM     Status: None   Collection Time    03/16/13  1:49 PM      Result Value Range   Magnesium 1.8  1.5 - 2.5 mg/dL  CBC WITH DIFFERENTIAL     Status: Abnormal   Collection Time    04/02/13 10:25 AM      Result Value Range   WBC 5.5  4.0 - 10.5 K/uL   RBC 4.16 (*) 4.22 - 5.81 MIL/uL   Hemoglobin 12.9 (*) 13.0 -  17.0 g/dL   HCT 95.6 (*) 21.3 - 08.6 %   MCV 89.2  78.0 - 100.0 fL   MCH 31.0  26.0 - 34.0 pg   MCHC 34.8  30.0 - 36.0 g/dL   RDW 57.8  46.9 - 62.9 %   Platelets 116 (*) 150 - 400 K/uL   Comment: Platelet count confirmed on smear     CHECKED FOR CLOT   Neutrophils Relative % 63  43 - 77 %   Neutro Abs 3.4  1.7 - 7.7 K/uL    Lymphocytes Relative 20  12 - 46 %   Lymphs Abs 1.1  0.7 - 4.0 K/uL   Monocytes Relative 15 (*) 3 - 12 %   Monocytes Absolute 0.8  0.1 - 1.0 K/uL   Eosinophils Relative 0  0 - 5 %   Eosinophils Absolute 0.0  0.0 - 0.7 K/uL   Basophils Relative 0  0 - 1 %   Basophils Absolute 0.0  0.0 - 0.1 K/uL   Smear Review Criteria for review not met    BASIC METABOLIC PANEL     Status: Abnormal   Collection Time    04/02/13 10:25 AM      Result Value Range   Sodium 136  135 - 145 mEq/L   Potassium 4.1  3.5 - 5.3 mEq/L   Chloride 101  96 - 112 mEq/L   CO2 26  19 - 32 mEq/L   Glucose, Bld 107 (*) 70 - 99 mg/dL   BUN 9  6 - 23 mg/dL   Creat 5.28  4.13 - 2.44 mg/dL   Calcium 9.5  8.4 - 01.0 mg/dL  D-DIMER, QUANTITATIVE     Status: Abnormal   Collection Time    04/02/13 10:25 AM      Result Value Range   D-Dimer, Quant 1.04 (*) 0.00 - 0.48 ug/mL-FEU   Comment: At the inhouse established cutoff value of 0.48 ug/mL FEU, this     methology has been documented in the literature to have a sensitivity     and negative predictive value of at least 98-99%.  The test result     should be correlated with an assessment of the clinical probability of     DVT/VTE.  TROPONIN I     Status: None   Collection Time    04/02/13 10:25 AM      Result Value Range   Troponin I <0.30  <0.30 ng/mL   Comment: No indication of myocardial injury.           Due to the release kinetics of cTnI, a negative result within the     first hours of the onset of symptoms does not rule out myocardial     infarction with certainty. If myocardial infarction is still suspected     repeat the test at appropriate intervals.  CK TOTAL AND CKMB     Status: None   Collection Time    04/02/13 10:25 AM      Result Value Range   Total CK 46  7 - 232 U/L   CK, MB 2.5  0.3 - 4.0 ng/mL   Relative Index NOT VALID  0.0 - 2.5   Comment: Relative Index not valid when CK is < 100  COMPREHENSIVE METABOLIC PANEL     Status: Abnormal    Collection Time    04/13/13 10:32 AM      Result Value Range   Sodium 139  135 - 145 mEq/L  Potassium 3.7  3.5 - 5.1 mEq/L   Chloride 104  96 - 112 mEq/L   CO2 26  19 - 32 mEq/L   Glucose, Bld 74  70 - 99 mg/dL   BUN 9  6 - 23 mg/dL   Creatinine, Ser 8.41  0.50 - 1.35 mg/dL   Calcium 9.3  8.4 - 32.4 mg/dL   Total Protein 7.0  6.0 - 8.3 g/dL   Albumin 3.4 (*) 3.5 - 5.2 g/dL   AST 25  0 - 37 U/L   ALT 19  0 - 53 U/L   Alkaline Phosphatase 56  39 - 117 U/L   Total Bilirubin 0.4  0.3 - 1.2 mg/dL   GFR calc non Af Amer 86 (*) >90 mL/min   GFR calc Af Amer >90  >90 mL/min   Comment:            The eGFR has been calculated     using the CKD EPI equation.     This calculation has not been     validated in all clinical     situations.     eGFR's persistently     <90 mL/min signify     possible Chronic Kidney Disease.  CBC WITH DIFFERENTIAL     Status: Abnormal   Collection Time    05/17/13  8:43 AM      Result Value Range   WBC 4.3  4.0 - 10.5 K/uL   RBC 4.02 (*) 4.22 - 5.81 MIL/uL   Hemoglobin 12.3 (*) 13.0 - 17.0 g/dL   HCT 40.1 (*) 02.7 - 25.3 %   MCV 92.0  78.0 - 100.0 fL   MCH 30.6  26.0 - 34.0 pg   MCHC 33.2  30.0 - 36.0 g/dL   RDW 66.4 (*) 40.3 - 47.4 %   Platelets 134 (*) 150 - 400 K/uL   Neutrophils Relative % 40 (*) 43 - 77 %   Neutro Abs 1.7  1.7 - 7.7 K/uL   Lymphocytes Relative 40  12 - 46 %   Lymphs Abs 1.7  0.7 - 4.0 K/uL   Monocytes Relative 15 (*) 3 - 12 %   Monocytes Absolute 0.6  0.1 - 1.0 K/uL   Eosinophils Relative 5  0 - 5 %   Eosinophils Absolute 0.2  0.0 - 0.7 K/uL   Basophils Relative 1  0 - 1 %   Basophils Absolute 0.0  0.0 - 0.1 K/uL  COMPREHENSIVE METABOLIC PANEL     Status: Abnormal   Collection Time    05/17/13  8:43 AM      Result Value Range   Sodium 138  135 - 145 mEq/L   Potassium 4.1  3.5 - 5.1 mEq/L   Chloride 104  96 - 112 mEq/L   CO2 26  19 - 32 mEq/L   Glucose, Bld 104 (*) 70 - 99 mg/dL   BUN 10  6 - 23 mg/dL   Creatinine,  Ser 2.59  0.50 - 1.35 mg/dL   Calcium 9.6  8.4 - 56.3 mg/dL   Total Protein 6.8  6.0 - 8.3 g/dL   Albumin 3.7  3.5 - 5.2 g/dL   AST 19  0 - 37 U/L   ALT 12  0 - 53 U/L   Alkaline Phosphatase 84  39 - 117 U/L   Total Bilirubin 0.5  0.3 - 1.2 mg/dL   GFR calc non Af Amer 88 (*) >90 mL/min   GFR calc Af  Amer >90  >90 mL/min   Comment:            The eGFR has been calculated     using the CKD EPI equation.     This calculation has not been     validated in all clinical     situations.     eGFR's persistently     <90 mL/min signify     possible Chronic Kidney Disease.  KAPPA/LAMBDA LIGHT CHAINS     Status: Abnormal   Collection Time    05/17/13  8:43 AM      Result Value Range   Kappa free light chain 2.25 (*) 0.33 - 1.94 mg/dL   Lamda free light chains 2.41  0.57 - 2.63 mg/dL   Kappa, lamda light chain ratio 0.93  0.26 - 1.65  LACTATE DEHYDROGENASE     Status: None   Collection Time    05/17/13  8:43 AM      Result Value Range   LDH 186  94 - 250 U/L  MULTIPLE MYELOMA PANEL, SERUM     Status: Abnormal   Collection Time    05/17/13  8:43 AM      Result Value Range   Total Protein 6.5  6.0 - 8.3 g/dL   Albumin ELP 16.1  09.6 - 66.1 %   Alpha-1-Globulin 5.9 (*) 2.9 - 4.9 %   Alpha-2-Globulin 12.3 (*) 7.1 - 11.8 %   Beta Globulin 6.8  4.7 - 7.2 %   Beta 2 4.7  3.2 - 6.5 %   Gamma Globulin 11.2  11.1 - 18.8 %   M-Spike, % NOT DETECTED     SPE Interp. (NOTE)     Comment: The possibility of a faint restricted band(s) cannot be completely     excluded in the gamma region.     Results are consistent with SPE performed on 03/10/13     Reviewed by Dallas Breeding, MD, PhD, FCAP (Electronic Signature on     File)   Comment (NOTE)     Comment: ---------------     Serum protein electrophoresis is a useful screening procedure in the     detection of various pathophysiologic states such as inflammation,     gammopathies, protein loss and other dysproteinemias.  Immunofixation      electrophoresis (IFE) is a more sensitive technique for the     identification of M-proteins found in patients with monoclonal     gammopathy of unknown significance (MGUS), amyloidosis, early or     treated myeloma or macroglobulinemia, solitary plasmacytoma or     extramedullary plasmacytoma.   IgG (Immunoglobin G), Serum 708  650 - 1600 mg/dL   IgA 045  68 - 409 mg/dL   IgM, Serum 16 (*) 41 - 251 mg/dL   Immunofix Electr Int (NOTE)     Comment: Area of slightly restricted mobility in the IgG and Kappa lanes.     Suggest repeat in 6-8 months, if clinically indicated.     Slight area of restriction in LAMBDAlane without IgG, IgA, IgM,     IgD, or IgE heavy chain detected.  Weak monoclonal free LAMBDAlight     chain is not excluded.  Suggest repeat in 6-8 months, if clinically     indicated.     Reviewed by Dallas Breeding, MD, PhD, FCAP (Electronic Signature on     File)     ASSESSMENT:  1.  IgG lambda multiple myeloma status post 4 cycles of  dexamethasone and Velcade followed by bone marrow transplantation by Dr. Greggory Parrish at [facility] October 2011.  Now on maintenance Revlimid which he is tolerating well.   2. Non-Hodgkin's lymphoma, stage IVb, low-grade B-cell type, symptomatic at presentation in July 2002 to 6 cycles of R. CHOP with a complete response and PET scan in June 2012 showed no evidence for recurrent disease  3. Hypogammaglobulinemia  4. 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 CAD status post stent placement x5 in the past  6. Colovesical fistula repair years ago  7. Skin cancer on the right ear status post surgery by Dr. Park Liter 8.  8. Recent CVA of left middle cerebral artery, causing expressive aphasia and right hemiparesis.  Both are significantly improved.  9. Osteoporosis on therapy with zoledronic acid calcium and vitamin D    PLAN:  1. I personally reviewed and went over laboratory results with the patient. 2. Labs in 3  months: LDH, MM panel CBC diff. 3. Labs monthly: CMET for Zometa 4. Continue with every 4 weeks Zometa. 5. Continue Revlimid maintenance 6. Return in 3 months for follow-up.   All questions were answered. The patient knows to call the clinic with any problems, questions or concerns. We can certainly see the patient much sooner if necessary.  The patient and plan discussed with Dr. Truett Perna and he is in agreement with the aforementioned.  Laural Benes, Clois Montavon E, PA-C

## 2013-05-24 ENCOUNTER — Ambulatory Visit (HOSPITAL_COMMUNITY)
Admission: RE | Admit: 2013-05-24 | Discharge: 2013-05-24 | Disposition: A | Discharge status: Home / Self Care | Payer: Medicare Other | Source: Ambulatory Visit | Attending: Orthopedic Surgery | Admitting: Orthopedic Surgery

## 2013-05-24 DIAGNOSIS — IMO0002 Reserved for concepts with insufficient information to code with codable children: Secondary | ICD-10-CM | POA: Diagnosis not present

## 2013-05-24 DIAGNOSIS — R52 Pain, unspecified: Secondary | ICD-10-CM

## 2013-05-24 DIAGNOSIS — M67919 Unspecified disorder of synovium and tendon, unspecified shoulder: Secondary | ICD-10-CM | POA: Diagnosis not present

## 2013-05-24 DIAGNOSIS — M19019 Primary osteoarthritis, unspecified shoulder: Secondary | ICD-10-CM | POA: Insufficient documentation

## 2013-05-24 DIAGNOSIS — M719 Bursopathy, unspecified: Secondary | ICD-10-CM | POA: Insufficient documentation

## 2013-05-24 DIAGNOSIS — R531 Weakness: Secondary | ICD-10-CM

## 2013-05-26 ENCOUNTER — Telehealth: Payer: Self-pay | Admitting: *Deleted

## 2013-05-26 NOTE — Telephone Encounter (Signed)
Spoke to [place] with [facility] about Mr. Spanos Revo ppm. Informed Paula Compton that pt's device is MRI compatible.

## 2013-05-27 DIAGNOSIS — M75120 Complete rotator cuff tear or rupture of unspecified shoulder, not specified as traumatic: Secondary | ICD-10-CM | POA: Diagnosis not present

## 2013-06-03 ENCOUNTER — Other Ambulatory Visit (HOSPITAL_COMMUNITY): Payer: Self-pay | Admitting: Oncology

## 2013-06-03 DIAGNOSIS — E876 Hypokalemia: Secondary | ICD-10-CM

## 2013-06-03 MED ORDER — POTASSIUM CHLORIDE CRYS ER 20 MEQ PO TBCR: 20.0000 meq | EXTENDED_RELEASE_TABLET | Freq: Three times a day (TID) | ORAL | Status: DC

## 2013-06-08 ENCOUNTER — Encounter: Payer: Self-pay | Admitting: Nurse Practitioner

## 2013-06-08 ENCOUNTER — Ambulatory Visit (INDEPENDENT_AMBULATORY_CARE_PROVIDER_SITE_OTHER): Payer: Medicare Other | Admitting: Nurse Practitioner

## 2013-06-08 VITALS — BP 121/67 | HR 64 | Ht 67.0 in | Wt 196.0 lb

## 2013-06-08 DIAGNOSIS — R4701 Aphasia: Secondary | ICD-10-CM

## 2013-06-08 DIAGNOSIS — I6350 Cerebral infarction due to unspecified occlusion or stenosis of unspecified cerebral artery: Secondary | ICD-10-CM

## 2013-06-08 NOTE — Patient Instructions (Signed)
  Continue aspirin and Plavix for secondary stroke prevention given history of cardiac stents as well as strict control of hypertension with blood pressure goal below 130/90 and hyperlipidemia with LDL cholesterol goal below 100 mg percent. I encouraged him to speak slowly and deliberately not been a rash to minimize his expressive language difficulties. Return for followup in 6 months.

## 2013-06-08 NOTE — Progress Notes (Signed)
[facility]  PATIENT: Jeremy Parrish DOB: 1941-09-15   HISTORY FROM: patient REASON FOR VISIT: routine stroke follow up  HISTORY OF PRESENT ILLNESS:  UPDATE 06/08/13 (LL):  Patient returns for 8 month follow up after stroke on 10/06/12.  Patient reports that he is doing well, has significant speech difficulty, says some days are better than others.  All other weakness has resolved.  He continues to take Plavix and aspirin daily with no side effects of bleeding or significant bruising.  No new neurovascular complaints.  02/17/13 (PS): 72 year old male seen for first office followup visit for hospital admission for stroke on 10/06/12 . He presented with sudden onset of inability to speak with right facial droop and right-sided weakness. His symptoms started resolving en route to the hospital. He met criteria for and was given IV TPA uneventfully. 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 Patient had a questionable history of atrial fibrillation. Patient had a pacemaker and cardiology interrogated the pacemaker but did not document atrial fibrillation. TEE was done which did not show any patent foramen ovale or cardiac source of embolism. Transthoraxic echocardiogram showed normal ejection fraction. Lipid profile and HbA1c was normal. CT angiogram of the neck showed only minor atheromatous changes and CT angiogram of the brain showed no large vessel stenosis. Vascular risk factors identified included coronary artery disease, hypertension, hyperlipidemia and pre diabetes. He states his speech is much better he can speak sentences but struggles when he tries to speak fast. He has finished outpatient speech therapy. His right hand strength is completely back to normal. The patient did not have outpatient cardiac telemetry done as planned as he has a pacemaker which was interrogated by his cardiologist and did not show atrial  fibrillation.  REVIEW OF SYSTEMS: Full 14 system review of systems performed and notable only for: constitutional: N/A  cardiovascular: N/A respiratory: Short of breath endocrine: N/A  ear/nose/throat: N/A  musculoskeletal: Cramps skin: N/A genitourinary: N/A Gastrointestinal: N/A allergy/immunology: Runny nose neurological: N/A sleep: N/A psychiatric: N/A   ALLERGIES: Allergies  Allergen Reactions  . Diphenhydramine Hcl     REACTION: "hyper"  . Morphine And Related Other (See Comments)    hallucinations    HOME MEDICATIONS: Outpatient Prescriptions Prior to Visit  Medication Sig Dispense Refill  . acyclovir (ZOVIRAX) 400 MG tablet Take 400 mg by mouth.       Marland Kitchen amLODipine (NORVASC) 10 MG tablet Take 10 mg by mouth daily.        Marland Kitchen aspirin 81 MG tablet Take 81 mg by mouth daily.      . chlorzoxazone (PARAFON) 500 MG tablet Take 1 tablet (500 mg total) by mouth 4 (four) times daily as needed for muscle spasms.  30 tablet  1  . citalopram (CELEXA) 40 MG tablet Take 40 mg by mouth daily.        . clopidogrel (PLAVIX) 75 MG tablet Take 75 mg by mouth daily.       Marland Kitchen gabapentin (NEURONTIN) 600 MG tablet Take 600-1,200 mg by mouth 4 (four) times daily. Takes 1 tablet three times a day      . lenalidomide (REVLIMID) 10 MG capsule Take 1 capsule (10 mg total) by mouth daily. Take 1 capsule by mouth daily for 7 days on followed by 7 days off  14 capsule  1  . loratadine-pseudoephedrine (CLARITIN-D 24 HOUR) 10-240 MG per 24 hr tablet Take 1 tablet by mouth daily.  30  tablet  3  . nitroGLYCERIN (NITROSTAT) 0.4 MG SL tablet Place 1 tablet (0.4 mg total) under the tongue every 5 (five) minutes as needed for chest pain.  25 tablet  12  . omeprazole (PRILOSEC) 20 MG capsule Take 1 capsule (20 mg total) by mouth daily.  90 capsule  1  . oxyCODONE-acetaminophen (PERCOCET/ROXICET) 5-325 MG per tablet Take 1 tablet by mouth every 6 (six) hours as needed for pain.  60 tablet  0  . potassium  chloride SA (KLOR-CON M20) 20 MEQ tablet Take 1 tablet (20 mEq total) by mouth 3 (three) times daily.  90 tablet  1  . simvastatin (ZOCOR) 20 MG tablet Take 20 mg by mouth at bedtime.         No facility-administered medications prior to visit.    PAST MEDICAL HISTORY: Past Medical History  Diagnosis Date  . Hypertension   . Heart disease   . Kidney stones     history  . Lung mass   . Heart murmur   . 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 Coronary artery disease   . Depression   . Shortness of breath   . Stroke 2013  . Peripheral arterial disease   . Bladder neck contracture   . Personal history of other diseases of circulatory system   . Aortic aneurysm of unspecified site without mention of rupture   . Arthritis   . Intestinovesical fistula   . Esophageal reflux   . Hyperlipidemia   . Anemia   . CHF (congestive heart failure)   . COPD (chronic obstructive pulmonary disease)   . Myocardial infarction   . Cancer   . Prostate cancer 2000  . Multiple myeloma   . Hx of bladder cancer 10/07/2012  . Non Hodgkin's lymphoma     PAST SURGICAL HISTORY: Past Surgical History  Procedure Laterality Date  . Prostate surgery    . Heart stents x 5    . Portacath placement    . Wrist surgery      right  . Left ear skin cancer removed    . Bone marrow transplant    . Pacemaker insertion    . Coronary angioplasty    . Insert / replace / remove pacemaker    . Tee without cardioversion  10/13/2012    Procedure: TRANSESOPHAGEAL ECHOCARDIOGRAM (TEE);  Surgeon: Thurmon Fair, MD;  Location: [facility] ENDOSCOPY;  Service: Cardiovascular;  Laterality: N/A;  pat/kay/echo notified  . Colonoscopy N/A 01/01/2013    Procedure: COLONOSCOPY;  Surgeon: Malissa Hippo, MD;  Location: [facility] ENDO SUITE;  Service: Endoscopy;  Laterality: N/A;  825-moved to 940 Ann notified pt    FAMILY HISTORY: Family History  Problem Relation Age of  Onset  . Colon cancer Neg Hx   . Colon polyps Neg Hx   . Cancer Father     bladder  . Heart disease Father     before age 6  . Hypertension Mother   . Cancer Brother   . Heart disease Brother     before age 59  . Heart disease Sister     before age 62  . Hyperlipidemia Sister   . Hypertension Sister   . Heart attack Sister     SOCIAL HISTORY: History   Social History  . Marital Status: Married    Spouse Name: Sheppard Evens    Number of Children: 3  . Years of Education: 9th   Occupational  History  . retired     Social History Main Topics  . Smoking status: Former Smoker -- 1.00 packs/day for 20 years    Types: Cigarettes    Quit date: 11/14/1994  . Smokeless tobacco: Never Used  . Alcohol Use: No     Comment: previously drank but none for at least 15 years.  . Drug Use: No  . Sexually Active: Not on file   Other Topics Concern  . Not on file   Social History Narrative   Patient lives at home spouse.   Caffeine Use: Occasionally     PHYSICAL EXAM  Filed Vitals:   06/08/13 1422  BP: 121/67  Pulse: 64  Height: 5\' 7"  (1.702 m)  Weight: 196 lb (88.905 kg)   Body mass index is 30.69 kg/(m^2).  Physical Exam  General: well developed, well nourished, seated, in no evident distress  Head: head normocephalic and atraumatic. Orohparynx benign  Neck: supple with no carotid or supraclavicular bruits  Cardiovascular: regular rate and rhythm, no murmurs  Musculoskeletal: no deformity  Skin: no rash/petichiae. Right ear lobe deformity from prior skin cancer surgery  Vascular: Normal pulses all extremities  Neurologic Exam  Mental Status: Awake and fully alert. Oriented to place and time. Recent and remote memory intact. Attention span, concentration and fund of knowledge appropriate. Mood and affect appropriate. Mild expressive aphasia with nonfluent speech and word finding difficulties. Only occasional paraphasias. Able to name repeat and comprehend well. Cranial  Nerves: Fundoscopic exam reveals sharp disc margins. Pupils equal, briskly reactive to light. Extraocular movements full without nystagmus. Visual fields full to confrontation. Hearing intact. Facial sensation intact. Face, tongue, palate moves normally and symmetrically.  Motor: Normal bulk and tone. Normal strength in all tested extremity muscles.  Sensory.: intact to touch, pinprick and vibratory.  Coordination: Rapid alternating movements normal in all extremities. Finger-to-nose and heel-to-shin performed accurately bilaterally.  Gait and Station: Arises from chair without difficulty. Stance is normal. Gait demonstrates normal stride length and balance . Able to heel, toe and tandem walk without difficulty.  Reflexes: 1+ and symmetric. Toes downgoing.    DIAGNOSTIC DATA (LABS, IMAGING, TESTING) - I reviewed patient records, labs, notes, testing and imaging myself where available.  Lab Results  Component Value Date   WBC 4.3 05/17/2013   HGB 12.3* 05/17/2013   HCT 37.0* 05/17/2013   MCV 92.0 05/17/2013   PLT 134* 05/17/2013      Component Value Date/Time   NA 138 05/17/2013 0843   K 4.1 05/17/2013 0843   CL 104 05/17/2013 0843   CO2 26 05/17/2013 0843   GLUCOSE 104* 05/17/2013 0843   BUN 10 05/17/2013 0843   CREATININE 0.81 05/17/2013 0843   CREATININE 0.77 04/02/2013 1025   CALCIUM 9.6 05/17/2013 0843   PROT 6.8 05/17/2013 0843   PROT 6.5 05/17/2013 0843   ALBUMIN 3.7 05/17/2013 0843   AST 19 05/17/2013 0843   ALT 12 05/17/2013 0843   ALKPHOS 84 05/17/2013 0843   BILITOT 0.5 05/17/2013 0843   GFRNONAA 88* 05/17/2013 0843   GFRAA >90 05/17/2013 0843   Lab Results  Component Value Date   CHOL 135 10/07/2012   HDL 25* 10/07/2012   LDLCALC 74 10/07/2012   TRIG 181* 10/07/2012   CHOLHDL 5.4 10/07/2012   Lab Results  Component Value Date   HGBA1C 5.9* 10/07/2012   No results found for this basename: VITAMINB12   No results found for this basename: TSH    Carotid Doppler study  02/14/2013: Bilateral  proximal ICAs demonstrated a mild amount of plaque without evidence of a significant diameter reduction, dissection or any other vascular abnormality. This is a mildly abnormal carotid duplex Doppler evaluation. It is recommended that this test be repeated in 24 months.  ASSESSMENT AND PLAN 72 year old male with embolic left MCA branch infarct in November 2013 treated with IV TPA with mild residual expressive aphasia. Multiple vascular risk factors of hypertension, hyperlipidemia and coronary artery disease.  PLAN:  Continue aspirin and Plavix for secondary stroke prevention given history of cardiac stents as well as strict control of hypertension with blood pressure goal below 130/90 and hyperlipidemia with LDL cholesterol goal below 100 mg percent. I encouraged him to speak slowly and deliberately not been a rash to minimize his expressive language difficulties.   Check lipid panel. Return for followup in 6 months.   LYNN LAM NP-C 06/08/2013, 2:36 PM  [facility] [address] [place], [place] [address] [phone]  I have personally examined this patient, reviewed pertinent data, developed plan of care and discussed with patient and agree with above.  Delia Heady, MD

## 2013-06-09 DIAGNOSIS — L905 Scar conditions and fibrosis of skin: Secondary | ICD-10-CM | POA: Diagnosis not present

## 2013-06-09 DIAGNOSIS — C44621 Squamous cell carcinoma of skin of unspecified upper limb, including shoulder: Secondary | ICD-10-CM | POA: Diagnosis not present

## 2013-06-10 ENCOUNTER — Other Ambulatory Visit (HOSPITAL_COMMUNITY): Payer: Self-pay | Admitting: Oncology

## 2013-06-10 DIAGNOSIS — C9000 Multiple myeloma not having achieved remission: Secondary | ICD-10-CM

## 2013-06-10 MED ORDER — LENALIDOMIDE 10 MG PO CAPS: 10.0000 mg | ORAL_CAPSULE | Freq: Every day | ORAL | Status: DC

## 2013-06-11 ENCOUNTER — Encounter: Payer: Self-pay | Admitting: Cardiovascular Disease

## 2013-06-11 ENCOUNTER — Ambulatory Visit (INDEPENDENT_AMBULATORY_CARE_PROVIDER_SITE_OTHER): Payer: Medicare Other | Admitting: Cardiovascular Disease

## 2013-06-11 VITALS — BP 118/78 | HR 77 | Ht 68.0 in | Wt 194.0 lb

## 2013-06-11 DIAGNOSIS — R079 Chest pain, unspecified: Secondary | ICD-10-CM

## 2013-06-11 DIAGNOSIS — Z8679 Personal history of other diseases of the circulatory system: Secondary | ICD-10-CM

## 2013-06-11 DIAGNOSIS — R001 Bradycardia, unspecified: Secondary | ICD-10-CM

## 2013-06-11 DIAGNOSIS — Z950 Presence of cardiac pacemaker: Secondary | ICD-10-CM

## 2013-06-11 DIAGNOSIS — E785 Hyperlipidemia, unspecified: Secondary | ICD-10-CM

## 2013-06-11 DIAGNOSIS — I2510 Atherosclerotic heart disease of native coronary artery without angina pectoris: Secondary | ICD-10-CM | POA: Diagnosis not present

## 2013-06-11 DIAGNOSIS — I498 Other specified cardiac arrhythmias: Secondary | ICD-10-CM

## 2013-06-11 DIAGNOSIS — Z01818 Encounter for other preprocedural examination: Secondary | ICD-10-CM

## 2013-06-11 NOTE — Assessment & Plan Note (Signed)
History of myocardial infarction back in 1996. He says stenting of his circumflex and RCA. The left and I placed 06/24/00 was a bare-metal stent to the mid RCA. EF at that time was 40%. He was recathed by myself 04/10/06 revealing patent stents with an EF of 45%. He does have chronic dyspnea but he says that he is more dyspneic on exertion and has occasional chest pain. He scheduled for a rotator cuff surgery in the near future. I am going to get a [employer]  a 2-D echo to further evaluate this.

## 2013-06-11 NOTE — Assessment & Plan Note (Signed)
This was implanted by Dr. Royann Shivers. It was interrogated today and functioning appropriately. He scheduled to see Dr.Croitoru  in 3 months

## 2013-06-11 NOTE — Progress Notes (Signed)
06/11/2013 Jeremy Parrish   1941-08-12  [id number]  Primary Physician Jeremy Asa, MD Primary Cardiologist: Jeremy Gess MD Jeremy Parrish   HPI:  The patient is a delightful 72 year old, mildly overweight, married Caucasian male, father of 3, grandfather to 4 grandchildren who I saw back in August of last year. He has a history of CAD dating back to 32. I intervened on him multiple times and have put stents in his RCA and circumflex coronary arteries. His last cath performed Apr 10, 2006, revealed patent stents with a 30% proximal LAD lesion and an EF of 45%. He does have chronic dyspnea but denies chest pain. His other problems include hypertension and hyperlipidemia. In addition, he does have bladder cancer as well as lymphoma, myeloma. He has had a partial colectomy. 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 Royann Parrish with improvement in his symptoms. He was last seen in the office for interrogation January 02, 2012 I saw him one year ago. He complains of increasing dyspnea and occasional chest pain. His last 2-D echo was performed 2 years ago and he had a hypokinetic lateral wall of his left Myoview stress test was performed 11/27/07. He is scheduled for right rotator cuff surgery in the near future. I'm going to get a 2-D echo and Myoview stress test to risk stratify him.    Current Outpatient Prescriptions  Medication Sig Dispense Refill  . amLODipine (NORVASC) 10 MG tablet Take 10 mg by mouth daily.        Marland Kitchen aspirin 81 MG tablet Take 81 mg by mouth daily.      . citalopram (CELEXA) 40 MG tablet Take 40 mg by mouth daily.        . clopidogrel (PLAVIX) 75 MG tablet Take 75 mg by mouth daily.       Marland Kitchen gabapentin (NEURONTIN) 600 MG tablet Take 600-1,200 mg by mouth 4 (four) times daily. Takes 1 tablet  three times a day      . lenalidomide (REVLIMID) 10 MG capsule Take 1 capsule (10 mg total) by mouth daily. Take 1 capsule by mouth daily for 7 days on followed by 7 days off  14 capsule  0  . loratadine-pseudoephedrine (CLARITIN-D 24 HOUR) 10-240 MG per 24 hr tablet Take 1 tablet by mouth daily.  30 tablet  3  . nitroGLYCERIN (NITROSTAT) 0.4 MG SL tablet Place 1 tablet (0.4 mg total) under the tongue every 5 (five) minutes as needed for chest pain.  25 tablet  12  . omeprazole (PRILOSEC) 20 MG capsule Take 1 capsule (20 mg total) by mouth daily.  90 capsule  1  . oxyCODONE-acetaminophen (PERCOCET/ROXICET) 5-325 MG per tablet Take 1 tablet by mouth every 6 (six) hours as needed for pain.  60 tablet  0  . potassium chloride SA (KLOR-CON M20) 20 MEQ tablet Take 1 tablet (20 mEq total) by mouth 3 (three) times daily.  90 tablet  1  . simvastatin (ZOCOR) 20 MG tablet Take 20 mg by mouth at bedtime.         No current facility-administered medications for this visit.    Allergies  Allergen Reactions  . Diphenhydramine Hcl     REACTION: "hyper"  . Morphine And Related Other (See Comments)    hallucinations    History   Social History  .  Marital Status: Married    Spouse Name: Jeremy Parrish    Number of Children: 3  . Years of Education: 9th   Occupational History  . retired     Social History Main Topics  . Smoking status: Former Smoker -- 1.00 packs/day for 20 years    Types: Cigarettes    Quit date: 11/14/1994  . Smokeless tobacco: Never Used  . Alcohol Use: No     Comment: previously drank but none for at least 15 years.  . Drug Use: No  . Sexually Active: Not on file   Other Topics Concern  . Not on file   Social History Narrative   Patient lives at home spouse.   Caffeine Use: Occasionally     Review of Systems: General: negative for chills, fever, night sweats or weight changes.  Cardiovascular: negative for chest pain, dyspnea on exertion, edema, orthopnea, palpitations,  paroxysmal nocturnal dyspnea or shortness of breath Dermatological: negative for rash Respiratory: negative for cough or wheezing Urologic: negative for hematuria Abdominal: negative for nausea, vomiting, diarrhea, bright red blood per rectum, melena, or hematemesis Neurologic: negative for visual changes, syncope, or dizziness All other systems reviewed and are otherwise negative except as noted above.    Blood pressure 118/78, pulse 77, height 5\' 8"  (1.727 m), weight 194 lb (87.998 kg).  General appearance: alert and no distress Neck: no adenopathy, no carotid bruit, no JVD, supple, symmetrical, trachea midline and thyroid not enlarged, symmetric, no tenderness/mass/nodules Lungs: clear to auscultation bilaterally Heart: regular rate and rhythm, S1, S2 normal, no murmur, click, rub or gallop Extremities: extremities normal, atraumatic, no cyanosis or edema  EKG paced rhythm with occasional PVCs  ASSESSMENT AND PLAN:   CAD, dating to 1996 with multiple PCIs, Stents to RCA and LCX, Last cath 2007 patent stents,30% prox LAD lesion, EF 45% History of myocardial infarction back in 1996. He says stenting of his circumflex and RCA. The left and I placed 06/24/00 was a bare-metal stent to the mid RCA. EF at that time was 40%. He was recathed by myself 04/10/06 revealing patent stents with an EF of 45%. He does have chronic dyspnea but he says that he is more dyspneic on exertion and has occasional chest pain. He scheduled for a rotator cuff surgery in the near future. I am going to get a [employer]  a 2-D echo to further evaluate this.  H/O cardiac pacemaker, Medtronic REVO, MRI conditional device, placed 07/2011 for sympyomatic bradycardia This was implanted by Dr. Royann Parrish. It was interrogated today and functioning appropriately. He scheduled to see Dr.Croitoru  in 3 months      Jeremy Gess MD [facility], [facility] 06/11/2013 5:24 PM

## 2013-06-11 NOTE — Progress Notes (Signed)
In clinic pacemaker interrogation. Normal device function. No changes made this session. 

## 2013-06-11 NOTE — Patient Instructions (Addendum)
Your physician recommends that you schedule a follow-up appointment in: 3 months with Dr.Croitoru    We will see you back in follow up after the tests  Dr Allyson Sabal has ordered a stress test and echocardiogram

## 2013-06-14 ENCOUNTER — Encounter (HOSPITAL_COMMUNITY): Payer: Medicare Other | Attending: Oncology

## 2013-06-14 DIAGNOSIS — C8589 Other specified types of non-Hodgkin lymphoma, extranodal and solid organ sites: Secondary | ICD-10-CM | POA: Diagnosis not present

## 2013-06-14 DIAGNOSIS — C9000 Multiple myeloma not having achieved remission: Secondary | ICD-10-CM | POA: Insufficient documentation

## 2013-06-14 LAB — LIPID PANEL
Cholesterol: 106 mg/dL (ref 0–200)
HDL: 25 mg/dL — ABNORMAL LOW (ref >39)
LDL Cholesterol: 53 mg/dL (ref 0–99)
Total CHOL/HDL Ratio: 4.2 Ratio
Triglycerides: 142 mg/dL (ref <150)
VLDL: 28 mg/dL (ref 0–40)

## 2013-06-14 LAB — COMPREHENSIVE METABOLIC PANEL
AST: 18 U/L (ref 0–37)
Albumin: 3.7 g/dL (ref 3.5–5.2)
BUN: 11 mg/dL (ref 6–23)
Calcium: 9.3 mg/dL (ref 8.4–10.5)
Chloride: 107 mEq/L (ref 96–112)
Creatinine, Ser: 0.88 mg/dL (ref 0.50–1.35)
Total Protein: 6.5 g/dL (ref 6.0–8.3)

## 2013-06-14 NOTE — Progress Notes (Signed)
Labs drawn today for cmp 

## 2013-06-15 ENCOUNTER — Ambulatory Visit (HOSPITAL_COMMUNITY)
Admission: RE | Admit: 2013-06-15 | Discharge: 2013-06-15 | Disposition: A | Discharge status: Home / Self Care | Payer: Medicare Other | Source: Ambulatory Visit | Attending: Cardiovascular Disease | Admitting: Cardiovascular Disease

## 2013-06-15 DIAGNOSIS — I359 Nonrheumatic aortic valve disorder, unspecified: Secondary | ICD-10-CM | POA: Diagnosis not present

## 2013-06-15 DIAGNOSIS — Z01818 Encounter for other preprocedural examination: Secondary | ICD-10-CM

## 2013-06-15 DIAGNOSIS — I059 Rheumatic mitral valve disease, unspecified: Secondary | ICD-10-CM | POA: Diagnosis not present

## 2013-06-15 DIAGNOSIS — I079 Rheumatic tricuspid valve disease, unspecified: Secondary | ICD-10-CM | POA: Insufficient documentation

## 2013-06-15 DIAGNOSIS — Q898 Other specified congenital malformations: Secondary | ICD-10-CM | POA: Insufficient documentation

## 2013-06-15 DIAGNOSIS — Z01810 Encounter for preprocedural cardiovascular examination: Secondary | ICD-10-CM

## 2013-06-15 DIAGNOSIS — I2510 Atherosclerotic heart disease of native coronary artery without angina pectoris: Secondary | ICD-10-CM | POA: Diagnosis not present

## 2013-06-15 NOTE — Progress Notes (Signed)
2D Echo Performed 06/15/2013    Madie Cahn, RCS  

## 2013-06-16 ENCOUNTER — Other Ambulatory Visit (HOSPITAL_COMMUNITY): Payer: Self-pay | Admitting: Oncology

## 2013-06-16 ENCOUNTER — Encounter (HOSPITAL_BASED_OUTPATIENT_CLINIC_OR_DEPARTMENT_OTHER): Payer: Medicare Other

## 2013-06-16 VITALS — BP 130/59 | HR 60 | Temp 98.2°F | Resp 20

## 2013-06-16 DIAGNOSIS — C8589 Other specified types of non-Hodgkin lymphoma, extranodal and solid organ sites: Secondary | ICD-10-CM

## 2013-06-16 DIAGNOSIS — C9000 Multiple myeloma not having achieved remission: Secondary | ICD-10-CM

## 2013-06-16 MED ORDER — HEPARIN SOD (PORK) LOCK FLUSH 100 UNIT/ML IV SOLN
INTRAVENOUS | Status: AC
  Filled 2013-06-16: qty 5

## 2013-06-16 MED ORDER — HYDROCODONE-ACETAMINOPHEN 5-325 MG PO TABS: 1.0000 | ORAL_TABLET | Freq: Four times a day (QID) | ORAL | Status: DC | PRN

## 2013-06-16 MED ORDER — ZOLEDRONIC ACID 4 MG/5ML IV CONC
4.0000 mg | Freq: Once | INTRAVENOUS | Status: AC
  Administered 2013-06-16: 4 mg via INTRAVENOUS
  Filled 2013-06-16: qty 5

## 2013-06-16 MED ORDER — HEPARIN SOD (PORK) LOCK FLUSH 100 UNIT/ML IV SOLN
500.0000 [IU] | Freq: Once | INTRAVENOUS | Status: AC | PRN
  Administered 2013-06-16: 500 [IU]
  Filled 2013-06-16: qty 5

## 2013-06-16 MED ORDER — SODIUM CHLORIDE 0.9 % IV SOLN
Freq: Once | INTRAVENOUS | Status: AC
  Administered 2013-06-16: 14:00:00 via INTRAVENOUS

## 2013-06-17 ENCOUNTER — Encounter: Payer: Self-pay | Admitting: *Deleted

## 2013-06-18 ENCOUNTER — Other Ambulatory Visit: Payer: Self-pay | Admitting: Family Medicine

## 2013-06-22 ENCOUNTER — Ambulatory Visit (HOSPITAL_COMMUNITY)
Admission: RE | Admit: 2013-06-22 | Discharge: 2013-06-22 | Disposition: A | Discharge status: Home / Self Care | Payer: Medicare Other | Source: Ambulatory Visit | Attending: Cardiovascular Disease | Admitting: Cardiovascular Disease

## 2013-06-22 ENCOUNTER — Ambulatory Visit: Payer: Medicare Other | Admitting: Nurse Practitioner

## 2013-06-22 DIAGNOSIS — R079 Chest pain, unspecified: Secondary | ICD-10-CM

## 2013-06-22 DIAGNOSIS — Z01818 Encounter for other preprocedural examination: Secondary | ICD-10-CM | POA: Insufficient documentation

## 2013-06-22 DIAGNOSIS — Z01810 Encounter for preprocedural cardiovascular examination: Secondary | ICD-10-CM

## 2013-06-22 MED ORDER — REGADENOSON 0.4 MG/5ML IV SOLN
0.4000 mg | Freq: Once | INTRAVENOUS | Status: AC
  Administered 2013-06-22: 0.4 mg via INTRAVENOUS

## 2013-06-22 MED ORDER — TECHNETIUM TC 99M SESTAMIBI GENERIC - CARDIOLITE
30.0000 | Freq: Once | INTRAVENOUS | Status: AC | PRN
  Administered 2013-06-22: 30 via INTRAVENOUS

## 2013-06-22 MED ORDER — AMINOPHYLLINE 25 MG/ML IV SOLN
75.0000 mg | Freq: Once | INTRAVENOUS | Status: AC
  Administered 2013-06-22: 75 mg via INTRAVENOUS

## 2013-06-22 MED ORDER — TECHNETIUM TC 99M SESTAMIBI GENERIC - CARDIOLITE
10.0000 | Freq: Once | INTRAVENOUS | Status: AC | PRN
  Administered 2013-06-22: 10 via INTRAVENOUS

## 2013-06-22 NOTE — Procedures (Addendum)
[facility] [address] [place] [address] [phone]  Cardiology Nuclear Med Study  Jeremy Parrish is a 72 y.o. male     MRN : [id number]     DOB: 17-Feb-1941  Procedure Date: 06/22/2013  Nuclear Med Background Indication for Stress Test:  Surgical Clearance and Stent Patency History:  COPD and CAD;MI--1996;STENT/PTCA--1996;PACER Cardiac Risk Factors: CVA, Family History - CAD, History of Smoking, Hypertension, Lipids and Obesity  Symptoms:  Chest Pain, Dizziness, DOE, Fatigue, Light-Headedness and SOB   Nuclear Pre-Procedure Caffeine/Decaff Intake:  1:00am NPO After: 11AM   IV Site: R Forearm  IV 0.9% NS with Angio Cath:  22g  Chest Size (in):  42"  IV Started by: Emmit Pomfret, RN  Height: 5\' 8"  (1.727 m)  Cup Size: n/a  BMI:  Body mass index is 29.5 kg/(m^2). Weight:  194 lb (87.998 kg)   Tech Comments:  PT HAS LEFT CHEST WALL PACER AND R CHEST WALL POWER PORT.     Nuclear Med Study 1 or 2 day study: 1 day  Stress Test Type:  Lexiscan  Order Authorizing Provider:  Nanetta Batty, MD   Resting Radionuclide: Technetium 69m Sestamibi  Resting Radionuclide Dose: 10.9 mCi   Stress Radionuclide:  Technetium 74m Sestamibi  Stress Radionuclide Dose: 32.0 mCi           Stress Protocol Rest HR:73 Stress HR:74  Rest BP: 139/74 Stress BP:147/85  Exercise Time (min): n/a METS: n/a          Dose of Adenosine (mg):  n/a Dose of Lexiscan: 0.4 mg  Dose of Atropine (mg): n/a Dose of Dobutamine: n/a mcg/kg/min (at max HR)  Stress Test Technologist: Ernestene Mention, CCT Nuclear Technologist: Koren Shiver, CNMT   Rest Procedure:  Myocardial perfusion imaging was performed at rest 45 minutes following the intravenous administration of Technetium 54m Sestamibi. Stress Procedure:  The patient received IV Lexiscan 0.4 mg over 15-seconds.  Technetium 42m Sestamibi injected at 30-seconds.  Due to patient's shortness of breath,  light-headedness and drop in blood pressure, he was given IV Aminophylline 75 mg. Symptoms were resolved during recovery. There were no significant changes with Lexiscan.  Quantitative spect images were obtained after a 45 minute delay.  Transient Ischemic Dilatation (Normal <1.22):  0.96 Lung/Heart Ratio (Normal <0.45):  0.41 QGS EDV:  n/a ml QGS ESV:  n/a ml LV Ejection Fraction: Study not gated  Signed by       Rest ECG: Bigiminal rhythm  Stress ECG: No significant change from baseline ECG  QPS Raw Data Images:  Patient motion noted. Stress Images:  Large in size and intensity defect from apex to base involving the inferior to lateral inferior wall Rest Images:  Comparison with the stress images reveals no significant change. Subtraction (SDS):  Large area of scar (exent 32%) with minimal peri-infarction inferior ischemia  Impression Exercise Capacity:  Lexiscan with no exercise. BP Response:  Hypotensive blood pressure response. Clinical Symptoms:  Mild shortness of breath ECG Impression:  Bigeminal rhythm without significant ST segment change suggestive of ischemia. Comparison with Prior Nuclear Study: No significant change from previous study  Overall Impression:  Low risk stress nuclear study demonstrating a large area of inferior to lateral inferior scar from apex to bases with minimal peri-infarct ischemia..  LV Wall Motion:  Not gated due to ectopy.   Lennette Bihari, MD  06/22/2013 7:18 PM

## 2013-07-01 ENCOUNTER — Encounter: Payer: Self-pay | Admitting: Oncology

## 2013-07-01 DIAGNOSIS — N320 Bladder-neck obstruction: Secondary | ICD-10-CM | POA: Diagnosis not present

## 2013-07-01 DIAGNOSIS — R39198 Other difficulties with micturition: Secondary | ICD-10-CM | POA: Diagnosis not present

## 2013-07-07 ENCOUNTER — Encounter: Payer: Self-pay | Admitting: Cardiovascular Disease

## 2013-07-07 ENCOUNTER — Other Ambulatory Visit: Payer: Self-pay | Admitting: Cardiovascular Disease

## 2013-07-07 ENCOUNTER — Ambulatory Visit (INDEPENDENT_AMBULATORY_CARE_PROVIDER_SITE_OTHER): Payer: Medicare Other | Admitting: Cardiovascular Disease

## 2013-07-07 VITALS — BP 108/72 | HR 38 | Ht 68.0 in | Wt 189.5 lb

## 2013-07-07 DIAGNOSIS — Z950 Presence of cardiac pacemaker: Secondary | ICD-10-CM | POA: Diagnosis not present

## 2013-07-07 DIAGNOSIS — R0602 Shortness of breath: Secondary | ICD-10-CM | POA: Diagnosis not present

## 2013-07-07 DIAGNOSIS — I498 Other specified cardiac arrhythmias: Secondary | ICD-10-CM | POA: Diagnosis not present

## 2013-07-07 DIAGNOSIS — Z8679 Personal history of other diseases of the circulatory system: Secondary | ICD-10-CM

## 2013-07-07 DIAGNOSIS — R001 Bradycardia, unspecified: Secondary | ICD-10-CM

## 2013-07-07 LAB — PACEMAKER DEVICE OBSERVATION
AL AMPLITUDE: 2 mv
AL IMPEDENCE PM: 392 Ohm
BATTERY VOLTAGE: 3 V
RV LEAD IMPEDENCE PM: 400 Ohm
RV LEAD THRESHOLD: 1 V
VENTRICULAR PACING PM: 16.6

## 2013-07-07 NOTE — Progress Notes (Signed)
07/07/2013 Jeremy Parrish   [birth year]  [id number]  Primary Physician [name], MD Primary Cardiologist: Runell Gess MD [name]   HPI:  The patient is a delightful 72 year old, mildly overweight, married Caucasian male, father of 3, grandfather to 4 grandchildren who I saw back in August of last year. He has a history of CAD dating back to 61. I intervened on him multiple times and have put stents in his RCA and circumflex coronary arteries. His last cath performed Apr 10, 2006, revealed patent stents with a 30% proximal LAD lesion and an EF of 45%. He does have chronic dyspnea but denies chest pain. His other problems include hypertension and hyperlipidemia. In addition, he does have bladder cancer as well as lymphoma, myeloma. He has had a partial colectomy. 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 Royann Shivers with improvement in his symptoms. He was last seen in the office for interrogation January 02, 2012  I saw him one year ago. He complains of increasing dyspnea and occasional chest pain. His last 2-D echo was performed 2 years ago and he had a hypokinetic lateral wall of his left Myoview stress test was performed 11/27/07. He is scheduled for right rotator cuff surgery in the near future. I obtained a Myoview stress test a 2-D echo revealing inferolateral scar unchanged from prior functional studies and moderately reduced LVEF of 35-40%. He again denies chest pain. I believe his shortness of breath is multifactorial. I'm going to clear him at moderately increased risk for his upcoming rotator cuff surgery given his LV dysfunction..    Current Outpatient Prescriptions  Medication Sig Dispense Refill  . ALPRAZolam (XANAX) 0.5 MG tablet Take 0.5 mg by mouth at bedtime as needed for sleep.      Marland Kitchen  amLODipine (NORVASC) 10 MG tablet Take 10 mg by mouth daily.        Marland Kitchen aspirin 81 MG tablet Take 81 mg by mouth daily.      . citalopram (CELEXA) 40 MG tablet Take 40 mg by mouth daily.        . clopidogrel (PLAVIX) 75 MG tablet Take 75 mg by mouth daily.       Marland Kitchen gabapentin (NEURONTIN) 600 MG tablet Take 600 mg by mouth 3 (three) times daily.       Marland Kitchen HYDROcodone-acetaminophen (NORCO/VICODIN) 5-325 MG per tablet Take 1 tablet by mouth every 6 (six) hours as needed for pain.  60 tablet  0  . lenalidomide (REVLIMID) 10 MG capsule Take 1 capsule (10 mg total) by mouth daily. Take 1 capsule by mouth daily for 7 days on followed by 7 days off  14 capsule  0  . nitroGLYCERIN (NITROSTAT) 0.4 MG SL tablet Place 1 tablet (0.4 mg total) under the tongue every 5 (five) minutes as needed for chest pain.  25 tablet  12  . omeprazole (PRILOSEC) 20 MG capsule Take 1 capsule (20 mg total) by mouth daily.  90 capsule  1  . potassium chloride SA (KLOR-CON M20) 20 MEQ tablet Take 1 tablet (20 mEq total) by mouth 3 (three) times daily.  90 tablet  1  . simvastatin (ZOCOR) 20 MG tablet Take 20 mg by mouth at bedtime.        . Zoledronic Acid (ZOMETA) 4 MG/100ML IVPB Inject 4 mg into the  vein every 30 (thirty) days.       No current facility-administered medications for this visit.    Allergies  Allergen Reactions  . Diphenhydramine Hcl     REACTION: "hyper"  . Morphine And Related Other (See Comments)    hallucinations    History   Social History  . Marital Status: Married    Spouse Name: Sheppard Evens    Number of Children: 3  . Years of Education: 9th   Occupational History  . retired     Social History Main Topics  . Smoking status: Former Smoker -- 1.00 packs/day for 20 years    Types: Cigarettes    Quit date: 11/14/1994  . Smokeless tobacco: Never Used  . Alcohol Use: No     Comment: previously drank but none for at least 15 years.  . Drug Use: No  . Sexual Activity: Not on file   Other Topics  Concern  . Not on file   Social History Narrative   Patient lives at home spouse.   Caffeine Use: Occasionally     Review of Systems: General: negative for chills, fever, night sweats or weight changes.  Cardiovascular: negative for chest pain, dyspnea on exertion, edema, orthopnea, palpitations, paroxysmal nocturnal dyspnea or shortness of breath Dermatological: negative for rash Respiratory: negative for cough or wheezing Urologic: negative for hematuria Abdominal: negative for nausea, vomiting, diarrhea, bright red blood per rectum, melena, or hematemesis Neurologic: negative for visual changes, syncope, or dizziness All other systems reviewed and are otherwise negative except as noted above.    Blood pressure 108/72, pulse 38, height 5\' 8"  (1.727 m), weight 189 lb 8 oz (85.957 kg).  General appearance: alert and no distress Neck: no adenopathy, no carotid bruit, no JVD, supple, symmetrical, trachea midline and thyroid not enlarged, symmetric, no tenderness/mass/nodules Lungs: clear to auscultation bilaterally Heart: regular rate and rhythm, S1, S2 normal, no murmur, click, rub or gallop Extremities: extremities normal, atraumatic, no cyanosis or edema  EKG not performed today  ASSESSMENT AND PLAN:   Shortness of breath The patient was seen in the office on 06/11/13 with rates of increasing shortness of breath. He also has what sounds like paroxysmal nocturnal dyspnea. He does have an history of CAD in the past and stenting of the circumflex and record artery. A Myoview stress test was performed and showed inferolateral scar unchanged from prior functional studies an echo that showed an EF of 35-40%. This is mildly reduced compared to prior assessment of LV function. He denies chest pain I do not think there is enough evidence to warrant proceeding with a more invasive strategy rather given his local comorbidities I prefer to continue to follow him medically. He'll be cleared for his  upcoming rotator cuff off surgical procedure at moderately increased risk because of moderate left ventricular dysfunction.  H/O cardiac pacemaker, Medtronic REVO, MRI conditional device, placed 07/2011 for sympyomatic bradycardia His pacemaker was interrogated today and he is pacing at a rate of 60 bigeminal PVCs.      Runell Gess MD FACP,FACC,FAHA, [facility] 07/07/2013 11:48 AM

## 2013-07-07 NOTE — Patient Instructions (Addendum)
Your physician wants you to follow-up in: 6 months with an extender and 1 year with Dr Berry. You will receive a reminder letter in the mail two months in advance. If you don't receive a letter, please call our office to schedule the follow-up appointment.  

## 2013-07-07 NOTE — Assessment & Plan Note (Signed)
His pacemaker was interrogated today and he is pacing at a rate of 60 bigeminal PVCs.

## 2013-07-07 NOTE — Assessment & Plan Note (Signed)
The patient was seen in the office on 06/11/13 with rates of increasing shortness of breath. He also has what sounds like paroxysmal nocturnal dyspnea. He does have an history of CAD in the past and stenting of the circumflex and record artery. A Myoview stress test was performed and showed inferolateral scar unchanged from prior functional studies an echo that showed an EF of 35-40%. This is mildly reduced compared to prior assessment of LV function. He denies chest pain I do not think there is enough evidence to warrant proceeding with a more invasive strategy rather given his local comorbidities I prefer to continue to follow him medically. He'll be cleared for his upcoming rotator cuff off surgical procedure at moderately increased risk because of moderate left ventricular dysfunction.

## 2013-07-13 ENCOUNTER — Encounter (HOSPITAL_COMMUNITY): Payer: Medicare Other | Attending: Oncology

## 2013-07-13 DIAGNOSIS — C8589 Other specified types of non-Hodgkin lymphoma, extranodal and solid organ sites: Secondary | ICD-10-CM

## 2013-07-13 DIAGNOSIS — C9000 Multiple myeloma not having achieved remission: Secondary | ICD-10-CM | POA: Insufficient documentation

## 2013-07-13 LAB — COMPREHENSIVE METABOLIC PANEL
BUN: 9 mg/dL (ref 6–23)
CO2: 23 mEq/L (ref 19–32)
Chloride: 107 mEq/L (ref 96–112)
Creatinine, Ser: 0.81 mg/dL (ref 0.50–1.35)
GFR calc Af Amer: >90 mL/min (ref >90)
GFR calc non Af Amer: 88 mL/min — ABNORMAL LOW (ref >90)
Glucose, Bld: 102 mg/dL — ABNORMAL HIGH (ref 70–99)
Total Bilirubin: 0.4 mg/dL (ref 0.3–1.2)

## 2013-07-13 NOTE — Progress Notes (Signed)
Jeremy Parrish's reason for visit today are for labs as scheduled per MD orders.  Venipuncture performed with a 23 gauge butterfly needle to R Antecubital.  [name] tolerated venipuncture well and without incident; questions were answered and patient was discharged.

## 2013-07-14 ENCOUNTER — Encounter (HOSPITAL_BASED_OUTPATIENT_CLINIC_OR_DEPARTMENT_OTHER): Payer: Medicare Other

## 2013-07-14 VITALS — BP 141/86 | HR 67 | Temp 97.6°F | Resp 18

## 2013-07-14 DIAGNOSIS — C9000 Multiple myeloma not having achieved remission: Secondary | ICD-10-CM

## 2013-07-14 DIAGNOSIS — C8589 Other specified types of non-Hodgkin lymphoma, extranodal and solid organ sites: Secondary | ICD-10-CM

## 2013-07-14 MED ORDER — ZOLEDRONIC ACID 4 MG/5ML IV CONC
4.0000 mg | Freq: Once | INTRAVENOUS | Status: AC
  Administered 2013-07-14: 4 mg via INTRAVENOUS
  Filled 2013-07-14: qty 5

## 2013-07-14 MED ORDER — SODIUM CHLORIDE 0.9 % IJ SOLN
10.0000 mL | INTRAMUSCULAR | Status: DC | PRN
  Administered 2013-07-14: 10 mL
  Filled 2013-07-14: qty 10

## 2013-07-14 MED ORDER — SODIUM CHLORIDE 0.9 % IV SOLN
Freq: Once | INTRAVENOUS | Status: AC
  Administered 2013-07-14: 14:00:00 via INTRAVENOUS

## 2013-07-14 MED ORDER — HEPARIN SOD (PORK) LOCK FLUSH 100 UNIT/ML IV SOLN
INTRAVENOUS | Status: AC
  Filled 2013-07-14: qty 5

## 2013-07-14 MED ORDER — HEPARIN SOD (PORK) LOCK FLUSH 100 UNIT/ML IV SOLN
500.0000 [IU] | Freq: Once | INTRAVENOUS | Status: AC | PRN
  Administered 2013-07-14: 500 [IU]
  Filled 2013-07-14: qty 5

## 2013-07-14 NOTE — Progress Notes (Signed)
Tolerated well

## 2013-07-16 ENCOUNTER — Other Ambulatory Visit (HOSPITAL_COMMUNITY): Payer: Self-pay | Admitting: Oncology

## 2013-07-16 DIAGNOSIS — C9000 Multiple myeloma not having achieved remission: Secondary | ICD-10-CM

## 2013-07-16 MED ORDER — LENALIDOMIDE 10 MG PO CAPS: 10.0000 mg | ORAL_CAPSULE | Freq: Every day | ORAL | Status: DC

## 2013-07-20 ENCOUNTER — Encounter (HOSPITAL_COMMUNITY): Payer: Self-pay | Admitting: *Deleted

## 2013-07-20 ENCOUNTER — Other Ambulatory Visit (HOSPITAL_COMMUNITY): Payer: Self-pay | Admitting: Oncology

## 2013-07-20 ENCOUNTER — Encounter (HOSPITAL_COMMUNITY): Payer: Self-pay | Admitting: Pharmacy Technician

## 2013-07-20 DIAGNOSIS — C9000 Multiple myeloma not having achieved remission: Secondary | ICD-10-CM

## 2013-07-20 MED ORDER — LENALIDOMIDE 10 MG PO CAPS: 10.0000 mg | ORAL_CAPSULE | Freq: Every day | ORAL | Status: DC

## 2013-07-20 NOTE — Progress Notes (Signed)
07/20/13 1838  OBSTRUCTIVE SLEEP APNEA  Have you ever been diagnosed with sleep apnea through a sleep study? No  Do you snore loudly (loud enough to be heard through closed doors)?  1  Do you often feel tired, fatigued, or sleepy during the daytime? 0  Has anyone observed you stop breathing during your sleep? 1  Do you have, or are you being treated for high blood pressure? 1  BMI more than 35 kg/m2? 0  Age over 71 years old? 1  Neck circumference greater than 40 cm/18 inches? 0 (17.5)  Gender: 1  Obstructive Sleep Apnea Score 5

## 2013-07-21 ENCOUNTER — Encounter (HOSPITAL_COMMUNITY): Payer: Self-pay | Admitting: Anesthesiology

## 2013-07-21 ENCOUNTER — Ambulatory Visit (HOSPITAL_COMMUNITY): Payer: Medicare Other

## 2013-07-21 ENCOUNTER — Ambulatory Visit (HOSPITAL_COMMUNITY)
Admission: RE | Admit: 2013-07-21 | Discharge: 2013-07-21 | Disposition: A | Discharge status: Home / Self Care | Payer: Medicare Other | Source: Ambulatory Visit | Attending: Orthopedic Surgery | Admitting: Orthopedic Surgery

## 2013-07-21 ENCOUNTER — Encounter (HOSPITAL_COMMUNITY)
Admission: RE | Disposition: A | Discharge status: Home / Self Care | Payer: Self-pay | Source: Ambulatory Visit | Attending: Orthopedic Surgery

## 2013-07-21 ENCOUNTER — Ambulatory Visit (HOSPITAL_COMMUNITY): Payer: Medicare Other | Admitting: Anesthesiology

## 2013-07-21 DIAGNOSIS — M25819 Other specified joint disorders, unspecified shoulder: Secondary | ICD-10-CM | POA: Insufficient documentation

## 2013-07-21 DIAGNOSIS — M12811 Other specific arthropathies, not elsewhere classified, right shoulder: Secondary | ICD-10-CM | POA: Diagnosis present

## 2013-07-21 DIAGNOSIS — M24819 Other specific joint derangements of unspecified shoulder, not elsewhere classified: Secondary | ICD-10-CM | POA: Diagnosis not present

## 2013-07-21 DIAGNOSIS — I10 Essential (primary) hypertension: Secondary | ICD-10-CM | POA: Diagnosis not present

## 2013-07-21 DIAGNOSIS — M25519 Pain in unspecified shoulder: Secondary | ICD-10-CM | POA: Diagnosis not present

## 2013-07-21 DIAGNOSIS — M12812 Other specific arthropathies, not elsewhere classified, left shoulder: Secondary | ICD-10-CM | POA: Diagnosis present

## 2013-07-21 DIAGNOSIS — I509 Heart failure, unspecified: Secondary | ICD-10-CM | POA: Diagnosis not present

## 2013-07-21 DIAGNOSIS — M75110 Incomplete rotator cuff tear or rupture of unspecified shoulder, not specified as traumatic: Secondary | ICD-10-CM | POA: Diagnosis not present

## 2013-07-21 DIAGNOSIS — Z01818 Encounter for other preprocedural examination: Secondary | ICD-10-CM | POA: Diagnosis not present

## 2013-07-21 DIAGNOSIS — M67919 Unspecified disorder of synovium and tendon, unspecified shoulder: Secondary | ICD-10-CM | POA: Diagnosis not present

## 2013-07-21 DIAGNOSIS — M719 Bursopathy, unspecified: Secondary | ICD-10-CM | POA: Insufficient documentation

## 2013-07-21 DIAGNOSIS — M795 Residual foreign body in soft tissue: Secondary | ICD-10-CM | POA: Diagnosis not present

## 2013-07-21 HISTORY — DX: Adverse effect of unspecified anesthetic, initial encounter: T41.45XA

## 2013-07-21 HISTORY — PX: SHOULDER ARTHROSCOPY WITH SUBACROMIAL DECOMPRESSION: SHX5684

## 2013-07-21 HISTORY — DX: Other complications of anesthesia, initial encounter: T88.59XA

## 2013-07-21 HISTORY — DX: Other specified postprocedural states: Z98.890

## 2013-07-21 HISTORY — DX: Other specified postprocedural states: R11.2

## 2013-07-21 LAB — BASIC METABOLIC PANEL
Calcium: 9.5 mg/dL (ref 8.4–10.5)
GFR calc Af Amer: >90 mL/min (ref >90)
GFR calc non Af Amer: 85 mL/min — ABNORMAL LOW (ref >90)
Potassium: 4.3 mEq/L (ref 3.5–5.1)
Sodium: 137 mEq/L (ref 135–145)

## 2013-07-21 LAB — CBC
Hemoglobin: 13.3 g/dL (ref 13.0–17.0)
Platelets: 124 10*3/uL — ABNORMAL LOW (ref 150–400)
RBC: 4.47 MIL/uL (ref 4.22–5.81)

## 2013-07-21 SURGERY — SHOULDER ARTHROSCOPY WITH SUBACROMIAL DECOMPRESSION
Anesthesia: General | Site: Shoulder | Laterality: Right | Wound class: Clean

## 2013-07-21 MED ORDER — BUPIVACAINE HCL (PF) 0.25 % IJ SOLN
INTRAMUSCULAR | Status: DC | PRN
  Administered 2013-07-21: 20 mL

## 2013-07-21 MED ORDER — PROPOFOL 10 MG/ML IV BOLUS
INTRAVENOUS | Status: DC | PRN
  Administered 2013-07-21: 160 mg via INTRAVENOUS

## 2013-07-21 MED ORDER — LIDOCAINE HCL (CARDIAC) 20 MG/ML IV SOLN
INTRAVENOUS | Status: DC | PRN
  Administered 2013-07-21: 50 mg via INTRAVENOUS

## 2013-07-21 MED ORDER — METOCLOPRAMIDE HCL 5 MG/ML IJ SOLN: 10.0000 mg | Freq: Once | INTRAMUSCULAR | Status: AC | PRN

## 2013-07-21 MED ORDER — EPINEPHRINE HCL 1 MG/ML IJ SOLN
INTRAMUSCULAR | Status: AC
  Filled 2013-07-21: qty 1

## 2013-07-21 MED ORDER — GLYCOPYRROLATE 0.2 MG/ML IJ SOLN
INTRAMUSCULAR | Status: DC | PRN
  Administered 2013-07-21: 0.6 mg via INTRAVENOUS

## 2013-07-21 MED ORDER — FENTANYL CITRATE 0.05 MG/ML IJ SOLN
INTRAMUSCULAR | Status: DC | PRN
  Administered 2013-07-21 (×5): 50 ug via INTRAVENOUS

## 2013-07-21 MED ORDER — METHYLPREDNISOLONE ACETATE 80 MG/ML IJ SUSP
INTRAMUSCULAR | Status: DC | PRN
  Administered 2013-07-21: 160 mg

## 2013-07-21 MED ORDER — CEFAZOLIN SODIUM-DEXTROSE 2-3 GM-% IV SOLR
2.0000 g | INTRAVENOUS | Status: AC
  Administered 2013-07-21: 2 g via INTRAVENOUS

## 2013-07-21 MED ORDER — LACTATED RINGERS IV SOLN
INTRAVENOUS | Status: DC | PRN
  Administered 2013-07-21 (×2): via INTRAVENOUS

## 2013-07-21 MED ORDER — NEOSTIGMINE METHYLSULFATE 1 MG/ML IJ SOLN
INTRAMUSCULAR | Status: DC | PRN
  Administered 2013-07-21: 4 mg via INTRAVENOUS

## 2013-07-21 MED ORDER — SODIUM CHLORIDE 0.9 % IR SOLN
Status: DC | PRN
  Administered 2013-07-21 (×2): 3000 mL

## 2013-07-21 MED ORDER — LACTATED RINGERS IV SOLN
INTRAVENOUS | Status: DC
  Administered 2013-07-21: 08:00:00 via INTRAVENOUS

## 2013-07-21 MED ORDER — FENTANYL CITRATE 0.05 MG/ML IJ SOLN: 25.0000 ug | INTRAMUSCULAR | Status: DC | PRN

## 2013-07-21 MED ORDER — CEFAZOLIN SODIUM-DEXTROSE 2-3 GM-% IV SOLR
INTRAVENOUS | Status: AC
  Filled 2013-07-21: qty 50

## 2013-07-21 MED ORDER — DEXAMETHASONE SODIUM PHOSPHATE 4 MG/ML IJ SOLN
INTRAMUSCULAR | Status: DC | PRN
  Administered 2013-07-21: 4 mg via INTRAVENOUS

## 2013-07-21 MED ORDER — ROCURONIUM BROMIDE 100 MG/10ML IV SOLN
INTRAVENOUS | Status: DC | PRN
  Administered 2013-07-21: 50 mg via INTRAVENOUS

## 2013-07-21 MED ORDER — EPHEDRINE SULFATE 50 MG/ML IJ SOLN
INTRAMUSCULAR | Status: DC | PRN
  Administered 2013-07-21: 10 mg via INTRAVENOUS
  Administered 2013-07-21: 15 mg via INTRAVENOUS

## 2013-07-21 MED ORDER — BUPIVACAINE HCL (PF) 0.25 % IJ SOLN
INTRAMUSCULAR | Status: AC
  Filled 2013-07-21: qty 30

## 2013-07-21 MED ORDER — CHLORHEXIDINE GLUCONATE 4 % EX LIQD: 60.0000 mL | Freq: Once | CUTANEOUS | Status: DC

## 2013-07-21 MED ORDER — MIDAZOLAM HCL 5 MG/5ML IJ SOLN
INTRAMUSCULAR | Status: DC | PRN
  Administered 2013-07-21: 1 mg via INTRAVENOUS

## 2013-07-21 MED ORDER — EPINEPHRINE HCL 1 MG/ML IJ SOLN
INTRAMUSCULAR | Status: DC | PRN
  Administered 2013-07-21 (×2): 1 mg

## 2013-07-21 MED ORDER — ONDANSETRON HCL 4 MG/2ML IJ SOLN
INTRAMUSCULAR | Status: DC | PRN
  Administered 2013-07-21: 4 mg via INTRAVENOUS

## 2013-07-21 MED ORDER — OXYCODONE-ACETAMINOPHEN 5-325 MG PO TABS: 1.0000 | ORAL_TABLET | Freq: Four times a day (QID) | ORAL | Status: DC | PRN

## 2013-07-21 SURGICAL SUPPLY — 57 items
APL SKNCLS STERI-STRIP NONHPOA (GAUZE/BANDAGES/DRESSINGS) ×1
BENZOIN TINCTURE PRP APPL 2/3 (GAUZE/BANDAGES/DRESSINGS) ×2 IMPLANT
BLADE CUTTER GATOR 3.5 (BLADE) ×1 IMPLANT
BLADE GREAT WHITE 4.2 (BLADE) ×2 IMPLANT
BLADE SURG 11 STRL SS (BLADE) ×2 IMPLANT
BOOTCOVER CLEANROOM LRG (PROTECTIVE WEAR) ×8 IMPLANT
BUR VERTEX HOODED 4.5 (BURR) ×1 IMPLANT
CLOTH BEACON ORANGE TIMEOUT ST (SAFETY) ×2 IMPLANT
COVER SURGICAL LIGHT HANDLE (MISCELLANEOUS) ×2 IMPLANT
DRAPE STERI 35X30 U-POUCH (DRAPES) ×2 IMPLANT
DRAPE SURG 17X23 STRL (DRAPES) ×2 IMPLANT
DRAPE U-SHAPE 47X51 STRL (DRAPES) ×6 IMPLANT
DRSG EMULSION OIL 3X3 NADH (GAUZE/BANDAGES/DRESSINGS) ×1 IMPLANT
DRSG PAD ABDOMINAL 8X10 ST (GAUZE/BANDAGES/DRESSINGS) ×4 IMPLANT
DURAPREP 26ML APPLICATOR (WOUND CARE) ×2 IMPLANT
ELECT MENISCUS 165MM 90D (ELECTRODE) IMPLANT
ELECT REM PT RETURN 9FT ADLT (ELECTROSURGICAL)
ELECTRODE REM PT RTRN 9FT ADLT (ELECTROSURGICAL) IMPLANT
FILTER STRAW FLUID ASPIR (MISCELLANEOUS) ×2 IMPLANT
GAUZE XEROFORM 1X8 LF (GAUZE/BANDAGES/DRESSINGS) ×2 IMPLANT
GLOVE BIOGEL M 7.0 STRL (GLOVE) ×1 IMPLANT
GLOVE BIOGEL M SZ8.5 STRL (GLOVE) ×1 IMPLANT
GLOVE BIOGEL PI IND STRL 6.5 (GLOVE) ×2 IMPLANT
GLOVE BIOGEL PI IND STRL 7.0 (GLOVE) IMPLANT
GLOVE BIOGEL PI IND STRL 8 (GLOVE) ×2 IMPLANT
GLOVE BIOGEL PI INDICATOR 6.5 (GLOVE) ×2
GLOVE BIOGEL PI INDICATOR 7.0 (GLOVE) ×2
GLOVE BIOGEL PI INDICATOR 8 (GLOVE) ×2
GLOVE ECLIPSE 7.5 STRL STRAW (GLOVE) ×4 IMPLANT
GOWN BRE IMP PREV XXLGXLNG (GOWN DISPOSABLE) ×1 IMPLANT
GOWN SRG XL XLNG 56XLVL 4 (GOWN DISPOSABLE) ×1 IMPLANT
GOWN STRL NON-REIN LRG LVL3 (GOWN DISPOSABLE) ×2 IMPLANT
GOWN STRL NON-REIN XL XLG LVL4 (GOWN DISPOSABLE) ×2
KIT BASIN OR (CUSTOM PROCEDURE TRAY) ×2 IMPLANT
KIT ROOM TURNOVER OR (KITS) ×2 IMPLANT
MANIFOLD NEPTUNE II (INSTRUMENTS) ×2 IMPLANT
NDL SPNL 18GX3.5 QUINCKE PK (NEEDLE) ×1 IMPLANT
NEEDLE 18GX1X1/2 (RX/OR ONLY) (NEEDLE) ×2 IMPLANT
NEEDLE 22X1 1/2 (OR ONLY) (NEEDLE) ×2 IMPLANT
NEEDLE SPNL 18GX3.5 QUINCKE PK (NEEDLE) ×2 IMPLANT
PACK SHOULDER (CUSTOM PROCEDURE TRAY) ×2 IMPLANT
PAD ARMBOARD 7.5X6 YLW CONV (MISCELLANEOUS) ×4 IMPLANT
PENCIL BUTTON HOLSTER BLD 10FT (ELECTRODE) IMPLANT
SET ARTHROSCOPY TUBING (MISCELLANEOUS) ×2
SET ARTHROSCOPY TUBING LN (MISCELLANEOUS) ×1 IMPLANT
SPONGE GAUZE 4X4 12PLY (GAUZE/BANDAGES/DRESSINGS) ×2 IMPLANT
SPONGE LAP 4X18 X RAY DECT (DISPOSABLE) ×2 IMPLANT
STRIP CLOSURE SKIN 1/2X4 (GAUZE/BANDAGES/DRESSINGS) ×2 IMPLANT
SUT ETHILON 4 0 PS 2 18 (SUTURE) ×2 IMPLANT
SYR 5ML LL (SYRINGE) ×2 IMPLANT
SYR CONTROL 10ML LL (SYRINGE) ×2 IMPLANT
TAPE CLOTH SURG 4X10 WHT LF (GAUZE/BANDAGES/DRESSINGS) ×1 IMPLANT
TOWEL OR 17X24 6PK STRL BLUE (TOWEL DISPOSABLE) ×2 IMPLANT
TOWEL OR 17X26 10 PK STRL BLUE (TOWEL DISPOSABLE) ×2 IMPLANT
TUBE CONNECTING 12X1/4 (SUCTIONS) ×2 IMPLANT
WAND 90 DEG TURBOVAC W/CORD (SURGICAL WAND) ×2 IMPLANT
WATER STERILE IRR 1000ML POUR (IV SOLUTION) ×2 IMPLANT

## 2013-07-21 NOTE — Preoperative (Signed)
Beta Blockers   Reason not to administer Beta Blockers:Not Applicable 

## 2013-07-21 NOTE — Anesthesia Procedure Notes (Signed)
Procedure Name: Intubation Date/Time: 07/21/2013 11:27 AM Performed by: Gayla Medicus Pre-anesthesia Checklist: Patient identified, Timeout performed, Emergency Drugs available, Suction available and Patient being monitored Patient Re-evaluated:Patient Re-evaluated prior to inductionOxygen Delivery Method: Circle system utilized Preoxygenation: Pre-oxygenation with 100% oxygen Intubation Type: IV induction Ventilation: Mask ventilation without difficulty and Oral airway inserted - appropriate to patient size Laryngoscope Size: Mac and 4 Grade View: Grade I Tube type: Oral Tube size: 7.5 mm Number of attempts: 1 Airway Equipment and Method: Stylet Placement Confirmation: ETT inserted through vocal cords under direct vision,  positive ETCO2 and breath sounds checked- equal and bilateral Secured at: 23 cm Tube secured with: Tape Dental Injury: Teeth and Oropharynx as per pre-operative assessment

## 2013-07-21 NOTE — Anesthesia Preprocedure Evaluation (Signed)
Anesthesia Evaluation  Patient identified by MRN, date of birth, ID band Patient awake    Reviewed: Allergy & Precautions, H&P , NPO status , Patient's Chart, lab work & pertinent test results, reviewed documented beta blocker date and time   History of Anesthesia Complications (+) PONV  Airway Mallampati: II TM Distance: >3 FB Neck ROM: full    Dental   Pulmonary neg pulmonary ROS, shortness of breath and at rest, asthma , COPD breath sounds clear to auscultation        Cardiovascular hypertension, On Medications + CAD, + Past MI, + Peripheral Vascular Disease and +CHF negative cardio ROS  + Valvular Problems/Murmurs Rhythm:regular     Neuro/Psych PSYCHIATRIC DISORDERS CVA, Residual Symptoms    GI/Hepatic Neg liver ROS, GERD-  Medicated and Controlled,  Endo/Other  negative endocrine ROS  Renal/GU negative Renal ROS  negative genitourinary   Musculoskeletal   Abdominal   Peds  Hematology  (+) anemia ,   Anesthesia Other Findings See surgeon's H&P   Reproductive/Obstetrics negative OB ROS                           Anesthesia Physical Anesthesia Plan  ASA: III  Anesthesia Plan: General   Post-op Pain Management:    Induction: Intravenous  Airway Management Planned: Oral ETT  Additional Equipment:   Intra-op Plan:   Post-operative Plan: Extubation in OR  Informed Consent: I have reviewed the patients History and Physical, chart, labs and discussed the procedure including the risks, benefits and alternatives for the proposed anesthesia with the patient or authorized representative who has indicated his/her understanding and acceptance.   Dental Advisory Given  Plan Discussed with: CRNA and Surgeon  Anesthesia Plan Comments:         Anesthesia Quick Evaluation

## 2013-07-21 NOTE — Brief Op Note (Signed)
07/21/2013  12:39 PM  PATIENT:  [name]  72 y.o. male  PRE-OPERATIVE DIAGNOSIS:  RIGHT SHOULDER PAIN WITH ROTATOR CUFF TEAR  POST-OPERATIVE DIAGNOSIS:  RIGHT SHOULDER PAIN WITH ROTATOR CUFF TEAR  PROCEDURE:  Procedure(s): RIGHT SHOULDER ARTHROSCOPY WITH SUBACROMIAL DECOMPRESSION AND DEBRIDEMENT & Injection of Left Shoulder (Right)  SURGEON:  Surgeon(s) and Role:    * Harvie Junior, MD - Primary  PHYSICIAN ASSISTANT:   ASSISTANTS: bethune   ANESTHESIA:   general  EBL:  Total I/O In: 1000 [I.V.:1000] Out: 50 [Blood:50]  BLOOD ADMINISTERED:none  DRAINS: none   LOCAL MEDICATIONS USED:  MARCAINE     SPECIMEN:  No Specimen  DISPOSITION OF SPECIMEN:  N/A  COUNTS:  YES  TOURNIQUET:  * No tourniquets in log *  DICTATION: .Other Dictation: Dictation Number [id number]  PLAN OF CARE: Discharge to home after PACU  PATIENT DISPOSITION:  PACU - hemodynamically stable.   Delay start of Pharmacological VTE agent (>24hrs) due to surgical blood loss or risk of bleeding: no

## 2013-07-21 NOTE — H&P (Signed)
PREOPERATIVE H&P  Chief Complaint: bilat shoulder pain  HPI: Jeremy Parrish is a 72 y.o. male who presents for evaluation of bilat shoulder pain with kmnown rct bilat. It has been present for 6 months and has been worsening. He has failed conservative measures. Pain is rated as severe.  Past Medical History  Diagnosis Date  . Hypertension   . Heart disease   . Kidney stones     history  . Lung mass   . Heart murmur   . 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 Coronary artery disease   . Depression   . Shortness of breath   . Peripheral arterial disease   . Bladder neck contracture   . Personal history of other diseases of circulatory system   . Aortic aneurysm of unspecified site without mention of rupture   . Arthritis   . Intestinovesical fistula   . Esophageal reflux   . Hyperlipidemia   . Anemia   . CHF (congestive heart failure)   . COPD (chronic obstructive pulmonary disease)   . Myocardial infarction   . Cerebral atherosclerosis     Carotid Doppler, 02/16/2013 - Bilateral Proximal ICAs,demonstrate mild plaque w/o evidence of significant diameter reduction, dissection, or any other vascular abnormality  . Complication of anesthesia   . PONV (postoperative nausea and vomiting)   . Stroke 2013    Speech.  . Cancer   . Prostate cancer 2000  . Multiple myeloma   . Hx of bladder cancer 10/07/2012  . Non Hodgkin's lymphoma    Past Surgical History  Procedure Laterality Date  . Prostate surgery    . Heart stents x 5  1999  . Portacath placement  07/26/2009  . Wrist surgery      right  . Left ear skin cancer removed    . Bone marrow transplant  2011  . Pacemaker insertion  2013  . Coronary angioplasty    . Insert / replace / remove pacemaker    . Tee without cardioversion  10/13/2012    Procedure: TRANSESOPHAGEAL ECHOCARDIOGRAM (TEE);  Surgeon: Thurmon Fair, MD;  Location: [facility] ENDOSCOPY;  Service:  Cardiovascular;  Laterality: N/A;  pat/kay/echo notified  . Colonoscopy N/A 01/01/2013    Procedure: COLONOSCOPY;  Surgeon: Malissa Hippo, MD;  Location: [facility] ENDO SUITE;  Service: Endoscopy;  Laterality: N/A;  825-moved to 940 Ann notified pt  . Cardiac catheterization  06/24/2000    RCA stented with a 3.5x15 stent resulting in a reduction of 60% segmental hypodense area with moderate in-stent restenosis to 0% residual  . Rotator    . Rotator cuff repair Right   . Colon surgery      colon resection  . Bladder surgery     History   Social History  . Marital Status: Married    Spouse Name: Sheppard Evens    Number of Children: 3  . Years of Education: 9th   Occupational History  . retired     Social History Main Topics  . Smoking status: Former Smoker -- 1.00 packs/day for 20 years    Types: Cigarettes    Quit date: 11/14/1994  . Smokeless tobacco: Never Used  . Alcohol Use: No     Comment: previously drank but none for at least 15 years.  . Drug Use: No  . Sexual Activity: None   Other Topics Concern  . None   Social History Narrative   Patient lives at home  spouse.   Caffeine Use: Occasionally   Family History  Problem Relation Age of Onset  . Colon cancer Neg Hx   . Colon polyps Neg Hx   . Cancer Father     bladder  . Heart disease Father     before age 70  . Hypertension Mother   . Cancer Brother   . Heart disease Brother     before age 70  . Heart disease Sister     before age 10  . Hyperlipidemia Sister   . Hypertension Sister   . Heart attack Sister    Allergies  Allergen Reactions  . Diphenhydramine Hcl     REACTION: "hyper"  . Morphine And Related Other (See Comments)    hallucinations   Prior to Admission medications   Medication Sig Start Date End Date Taking? Authorizing Provider  ALPRAZolam Prudy Feeler) 0.5 MG tablet Take 0.5 mg by mouth at bedtime as needed for sleep.   Yes Historical Provider, MD  amLODipine (NORVASC) 10 MG tablet Take 10 mg by mouth  daily.     Yes Historical Provider, MD  aspirin 81 MG tablet Take 81 mg by mouth daily.   Yes Historical Provider, MD  citalopram (CELEXA) 40 MG tablet Take 40 mg by mouth daily.    Yes Historical Provider, MD  gabapentin (NEURONTIN) 600 MG tablet Take 600 mg by mouth 3 (three) times daily.    Yes Historical Provider, MD  HYDROcodone-acetaminophen (NORCO/VICODIN) 5-325 MG per tablet Take 1 tablet by mouth every 6 (six) hours as needed for pain. 06/16/13  Yes Maurine Minister Kefalas, PA-C  lenalidomide (REVLIMID) 10 MG capsule Take 1 capsule (10 mg total) by mouth daily. Take 1 capsule by mouth daily for 7 days on followed by 7 days off 07/20/13  Yes Ellouise Newer, PA-C  nitroGLYCERIN (NITROSTAT) 0.4 MG SL tablet Place 1 tablet (0.4 mg total) under the tongue every 5 (five) minutes as needed for chest pain. 04/02/13  Yes Babs Sciara, MD  omeprazole (PRILOSEC) 20 MG capsule Take 1 capsule (20 mg total) by mouth daily. 03/22/13  Yes Merlyn Albert, MD  potassium chloride SA (KLOR-CON M20) 20 MEQ tablet Take 1 tablet (20 mEq total) by mouth 3 (three) times daily. 06/03/13  Yes Maurine Minister Kefalas, PA-C  simvastatin (ZOCOR) 20 MG tablet Take 20 mg by mouth at bedtime.     Yes Historical Provider, MD  Zoledronic Acid (ZOMETA) 4 MG/100ML IVPB Inject 4 mg into the vein every 30 (thirty) days.   Yes Historical Provider, MD  clopidogrel (PLAVIX) 75 MG tablet Take 75 mg by mouth daily.  12/02/12   Historical Provider, MD     Positive ROS: none  All other systems have been reviewed and were otherwise negative with the exception of those mentioned in the HPI and as above.  Physical Exam: Filed Vitals:   07/21/13 0940  BP: 145/79  Pulse: 62  Temp: 97 F (36.1 C)  Resp: 18    General: Alert, no acute distress Cardiovascular: No pedal edema Respiratory: No cyanosis, no use of accessory musculature GI: No organomegaly, abdomen is soft and non-tender Skin: No lesions in the area of chief complaint Neurologic:  Sensation intact distally Psychiatric: Patient is competent for consent with normal mood and affect Lymphatic: No axillary or cervical lymphadenopathy  MUSCULOSKELETAL: weakness and pain all rom bilat Recent Results (from the past 2160 hour(s))  CBC WITH DIFFERENTIAL     Status: Abnormal   Collection Time  05/17/13  8:43 AM      Result Value Range   WBC 4.3  4.0 - 10.5 K/uL   RBC 4.02 (*) 4.22 - 5.81 MIL/uL   Hemoglobin 12.3 (*) 13.0 - 17.0 g/dL   HCT 16.1 (*) 09.6 - 04.5 %   MCV 92.0  78.0 - 100.0 fL   MCH 30.6  26.0 - 34.0 pg   MCHC 33.2  30.0 - 36.0 g/dL   RDW 40.9 (*) 81.1 - 91.4 %   Platelets 134 (*) 150 - 400 K/uL   Neutrophils Relative % 40 (*) 43 - 77 %   Neutro Abs 1.7  1.7 - 7.7 K/uL   Lymphocytes Relative 40  12 - 46 %   Lymphs Abs 1.7  0.7 - 4.0 K/uL   Monocytes Relative 15 (*) 3 - 12 %   Monocytes Absolute 0.6  0.1 - 1.0 K/uL   Eosinophils Relative 5  0 - 5 %   Eosinophils Absolute 0.2  0.0 - 0.7 K/uL   Basophils Relative 1  0 - 1 %   Basophils Absolute 0.0  0.0 - 0.1 K/uL  COMPREHENSIVE METABOLIC PANEL     Status: Abnormal   Collection Time    05/17/13  8:43 AM      Result Value Range   Sodium 138  135 - 145 mEq/L   Potassium 4.1  3.5 - 5.1 mEq/L   Chloride 104  96 - 112 mEq/L   CO2 26  19 - 32 mEq/L   Glucose, Bld 104 (*) 70 - 99 mg/dL   BUN 10  6 - 23 mg/dL   Creatinine, Ser 7.82  0.50 - 1.35 mg/dL   Calcium 9.6  8.4 - 95.6 mg/dL   Total Protein 6.8  6.0 - 8.3 g/dL   Albumin 3.7  3.5 - 5.2 g/dL   AST 19  0 - 37 U/L   ALT 12  0 - 53 U/L   Alkaline Phosphatase 84  39 - 117 U/L   Total Bilirubin 0.5  0.3 - 1.2 mg/dL   GFR calc non Af Amer 88 (*) >90 mL/min   GFR calc Af Amer >90  >90 mL/min   Comment:            The eGFR has been calculated     using the CKD EPI equation.     This calculation has not been     validated in all clinical     situations.     eGFR's persistently     <90 mL/min signify     possible Chronic Kidney Disease.   KAPPA/LAMBDA LIGHT CHAINS     Status: Abnormal   Collection Time    05/17/13  8:43 AM      Result Value Range   Kappa free light chain 2.25 (*) 0.33 - 1.94 mg/dL   Lamda free light chains 2.41  0.57 - 2.63 mg/dL   Kappa, lamda light chain ratio 0.93  0.26 - 1.65  LACTATE DEHYDROGENASE     Status: None   Collection Time    05/17/13  8:43 AM      Result Value Range   LDH 186  94 - 250 U/L  MULTIPLE MYELOMA PANEL, SERUM     Status: Abnormal   Collection Time    05/17/13  8:43 AM      Result Value Range   Total Protein 6.5  6.0 - 8.3 g/dL   Albumin ELP 21.3  08.6 - 66.1 %  Alpha-1-Globulin 5.9 (*) 2.9 - 4.9 %   Alpha-2-Globulin 12.3 (*) 7.1 - 11.8 %   Beta Globulin 6.8  4.7 - 7.2 %   Beta 2 4.7  3.2 - 6.5 %   Gamma Globulin 11.2  11.1 - 18.8 %   M-Spike, % NOT DETECTED     SPE Interp. (NOTE)     Comment: The possibility of a faint restricted band(s) cannot be completely     excluded in the gamma region.     Results are consistent with SPE performed on 03/10/13     Reviewed by Dallas Breeding, MD, PhD, FCAP (Electronic Signature on     File)   Comment (NOTE)     Comment: ---------------     Serum protein electrophoresis is a useful screening procedure in the     detection of various pathophysiologic states such as inflammation,     gammopathies, protein loss and other dysproteinemias.  Immunofixation     electrophoresis (IFE) is a more sensitive technique for the     identification of M-proteins found in patients with monoclonal     gammopathy of unknown significance (MGUS), amyloidosis, early or     treated myeloma or macroglobulinemia, solitary plasmacytoma or     extramedullary plasmacytoma.   IgG (Immunoglobin G), Serum 708  650 - 1600 mg/dL   IgA 409  68 - 811 mg/dL   IgM, Serum 16 (*) 41 - 251 mg/dL   Immunofix Electr Int (NOTE)     Comment: Area of slightly restricted mobility in the IgG and Kappa lanes.     Suggest repeat in 6-8 months, if clinically indicated.      Slight area of restriction in LAMBDAlane without IgG, IgA, IgM,     IgD, or IgE heavy chain detected.  Weak monoclonal free LAMBDAlight     chain is not excluded.  Suggest repeat in 6-8 months, if clinically     indicated.     Reviewed by Dallas Breeding, MD, PhD, FCAP (Electronic Signature on     File)  LIPID PANEL     Status: Abnormal   Collection Time    06/14/13  9:10 AM      Result Value Range   Cholesterol 106  0 - 200 mg/dL   Comment: ATP III Classification:           < 200        mg/dL        Desirable          200 - 239     mg/dL        Borderline High          >= 240        mg/dL        High         Triglycerides 142  <150 mg/dL   HDL 25 (*) >91 mg/dL   Total CHOL/HDL Ratio 4.2     VLDL 28  0 - 40 mg/dL   LDL Cholesterol 53  0 - 99 mg/dL   Comment:       Total Cholesterol/HDL Ratio:CHD Risk                            Coronary Heart Disease Risk Table  Men       Women              1/2 Average Risk              3.4        3.3                  Average Risk              5.0        4.4               2X Average Risk              9.6        7.1               3X Average Risk             23.4       11.0     Use the calculated Patient Ratio above and the CHD Risk table      to determine the patient's CHD Risk.     ATP III Classification (LDL):           < 100        mg/dL         Optimal          100 - 129     mg/dL         Near or Above Optimal          130 - 159     mg/dL         Borderline High          160 - 189     mg/dL         High           > 190        mg/dL         Very High        COMPREHENSIVE METABOLIC PANEL     Status: Abnormal   Collection Time    06/14/13  9:12 AM      Result Value Range   Sodium 140  135 - 145 mEq/L   Potassium 3.8  3.5 - 5.1 mEq/L   Chloride 107  96 - 112 mEq/L   CO2 23  19 - 32 mEq/L   Glucose, Bld 102 (*) 70 - 99 mg/dL   BUN 11  6 - 23 mg/dL   Creatinine, Ser 1.61  0.50 - 1.35 mg/dL    Calcium 9.3  8.4 - 09.6 mg/dL   Total Protein 6.5  6.0 - 8.3 g/dL   Albumin 3.7  3.5 - 5.2 g/dL   AST 18  0 - 37 U/L   ALT 12  0 - 53 U/L   Alkaline Phosphatase 63  39 - 117 U/L   Total Bilirubin 0.7  0.3 - 1.2 mg/dL   GFR calc non Af Amer 85 (*) >90 mL/min   GFR calc Af Amer >90  >90 mL/min   Comment:            The eGFR has been calculated     using the CKD EPI equation.     This calculation has not been     validated in all clinical     situations.     eGFR's persistently     <90 mL/min signify     possible Chronic Kidney Disease.  PACEMAKER DEVICE  OBSERVATION     Status: None   Collection Time    07/07/13  1:36 PM      Result Value Range   DEVICE MODEL PM [id number]     [id number] Croitoru, Mihai   MD     [id number] Croitoru, Mihai   MD     [facility] NOTES PM       Value: Pacemaker check in clinic with Dr.Berry for "low HR". Normal device function. Thresholds, sensing, impedances consistent with previous measurements. Device programmed to maximize longevity. No mode switch or high ventricular rates noted. Patient has      chronic bigeminy , which was found to be the cause of the "low HR" seen on automated monitors. Device programmed at appropriate safety margins. Histogram distribution appropriate for patient activity level. Device programmed to optimize intrinsic      conduction.  Patient will [facility] in November 2014.   ATRIAL PACING PM 97.6     VENTRICULAR PACING PM 16.6     BATTERY VOLTAGE 3.00     AL IMPEDENCE PM 392     RV LEAD IMPEDENCE PM 400     AL AMPLITUDE 2     RV LEAD AMPLITUDE 4.4     AL THRESHOLD 1     RV LEAD THRESHOLD 1     BAMS-0001 400    COMPREHENSIVE METABOLIC PANEL     Status: Abnormal   Collection Time    07/13/13  9:27 AM      Result Value Range   Sodium 138  135 - 145 mEq/L   Potassium 4.1  3.5 - 5.1 mEq/L   Chloride 107  96 - 112 mEq/L   CO2 23  19 - 32 mEq/L   Glucose, Bld 102 (*) 70 - 99 mg/dL   BUN 9  6 - 23 mg/dL    Creatinine, Ser 1.30  0.50 - 1.35 mg/dL   Calcium 9.2  8.4 - 86.5 mg/dL   Total Protein 6.6  6.0 - 8.3 g/dL   Albumin 3.7  3.5 - 5.2 g/dL   AST 20  0 - 37 U/L   ALT 12  0 - 53 U/L   Alkaline Phosphatase 68  39 - 117 U/L   Total Bilirubin 0.4  0.3 - 1.2 mg/dL   GFR calc non Af Amer 88 (*) >90 mL/min   GFR calc Af Amer >90  >90 mL/min   Comment: (NOTE)     The eGFR has been calculated using the CKD EPI equation.     This calculation has not been validated in all clinical situations.     eGFR's persistently <90 mL/min signify possible Chronic Kidney     Disease.  BASIC METABOLIC PANEL     Status: Abnormal   Collection Time    07/21/13  8:08 AM      Result Value Range   Sodium 137  135 - 145 mEq/L   Potassium 4.3  3.5 - 5.1 mEq/L   Chloride 104  96 - 112 mEq/L   CO2 25  19 - 32 mEq/L   Glucose, Bld 96  70 - 99 mg/dL   BUN 10  6 - 23 mg/dL   Creatinine, Ser 7.84  0.50 - 1.35 mg/dL   Calcium 9.5  8.4 - 69.6 mg/dL   GFR calc non Af Amer 85 (*) >90 mL/min   GFR calc Af Amer >90  >90 mL/min   Comment: (NOTE)     The eGFR has  been calculated using the CKD EPI equation.     This calculation has not been validated in all clinical situations.     eGFR's persistently <90 mL/min signify possible Chronic Kidney     Disease.  CBC     Status: Abnormal   Collection Time    07/21/13  8:08 AM      Result Value Range   WBC 4.4  4.0 - 10.5 K/uL   RBC 4.47  4.22 - 5.81 MIL/uL   Hemoglobin 13.3  13.0 - 17.0 g/dL   HCT 16.1  09.6 - 04.5 %   MCV 88.6  78.0 - 100.0 fL   MCH 29.8  26.0 - 34.0 pg   MCHC 33.6  30.0 - 36.0 g/dL   RDW 40.9  81.1 - 91.4 %   Platelets 124 (*) 150 - 400 K/uL   Assessment/Plan: RIGHT SHOULDER PAIN WITH ROTATOR CUFF TEAR left shoulder pain and RCT Plan for Procedure(s): RIGHT SHOULDER ARTHROSCOPY WITH SUBACROMIAL DECOMPRESSION AND DEBRIDEMENT L shoulder injection  The risks benefits and alternatives were discussed with the patient including but not limited to the  risks of nonoperative treatment, versus surgical intervention including infection, bleeding, nerve injury, malunion, nonunion, hardware prominence, hardware failure, need for hardware removal, blood clots, cardiopulmonary complications, morbidity, mortality, among others, and they were willing to proceed.  Predicted outcome is good, although there will be at least a six to nine month expected recovery.  Antavion Bartoszek L, MD 07/21/2013 11:03 AM

## 2013-07-21 NOTE — Progress Notes (Signed)
Orthopedic Tech Progress Note Patient Details:  Jeremy Parrish 1940/12/03 [id number]  Ortho Devices Type of Ortho Device: Arm sling Ortho Device/Splint Interventions: Application   Cammer, Mickie Bail 07/21/2013, 1:24 PM

## 2013-07-21 NOTE — Transfer of Care (Signed)
Immediate Anesthesia Transfer of Care Note  Patient: Jeremy Parrish  Procedure(s) Performed: Procedure(s): RIGHT SHOULDER ARTHROSCOPY WITH SUBACROMIAL DECOMPRESSION AND DEBRIDEMENT & Injection of Left Shoulder (Right)  Patient Location: PACU  Anesthesia Type:General  Level of Consciousness: awake, alert  and oriented  Airway & Oxygen Therapy: Patient Spontanous Breathing and Patient connected to nasal cannula oxygen  Post-op Assessment: Report given to PACU RN and Post -op Vital signs reviewed and stable  Post vital signs: Reviewed and stable  Complications: No apparent anesthesia complications

## 2013-07-21 NOTE — Anesthesia Postprocedure Evaluation (Signed)
Anesthesia Post Note  Patient: Jeremy Parrish  Procedure(s) Performed: Procedure(s) (LRB): RIGHT SHOULDER ARTHROSCOPY WITH SUBACROMIAL DECOMPRESSION AND DEBRIDEMENT & Injection of Left Shoulder (Right)  Anesthesia type: General  Patient location: PACU  Post pain: Pain level controlled  Post assessment: Patient's Cardiovascular Status Stable  Last Vitals:  Filed Vitals:   07/21/13 1400  BP: 126/95  Pulse: 80  Temp:   Resp: 18    Post vital signs: Reviewed and stable  Level of consciousness: alert  Complications: No apparent anesthesia complications Surgeon will arrange consult with cardiology to ensure proper functioning of pacemaker before discharge.

## 2013-07-22 NOTE — Op Note (Signed)
NAME:  [name], [name] NO.:  [id number]  MEDICAL RECORD NO.:  [id number]  LOCATION:  [facility]                         FACILITY:  [facility]  PHYSICIAN:  Harvie Junior, M.D.   DATE OF BIRTH:  1941/03/05  DATE OF PROCEDURE:  07/21/2013 DATE OF DISCHARGE:                              OPERATIVE REPORT   PREOPERATIVE DIAGNOSES: 1. Impingement with known rotator cuff tear. 2. Left shoulder pain.  POSTOPERATIVE DIAGNOSES: 1. Impingement with known rotator cuff tear. 2. Left shoulder pain. 3. Retained foreign body.  PROCEDURE: 1. Subacromial decompression for lateral and posterior compartment. 2. Debridement of rotator cuff tear edges as well as removal of     foreign body. 3. Subacromial injection of left shoulder with 6 mL Marcaine and 2 mL     of 80 mg/mL Depo-Medrol.  BRIEF HISTORY:  [name] is a 72 year old male with a history of having had a previous rotator cuff repair.  He had continued to have pain.  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  We had injected him with good relief, but he continued to have complaints of pain.  We are concerned about the massive nature of the rotator cuff tear and felt that it was reasonable to try arthroscopy given that he was having such severe pain and inability to be controlled with injection therapy and our desire not to do a reverse total shoulder replacement on him, so after failure of conservative care, he was taken to the operating room for right shoulder arthroscopy.  PROCEDURE IN DETAIL:  The patient was taken to the operating room. After adequate anesthesia was achieved with general anesthetic, the patient was placed supine on the operating table.  He was then moved into the beach chair positioner and then sat up in the beach chair position.  Once this was done, his left shoulder was injected in the subacromial space with 6 mL of Marcaine and 2 mL at 80 mg/mL Depo- Medrol.  At this point,  attention was turned to the right shoulder, which was prepped and draped in usual sterile fashion.  Following this, routine arthroscopic examination of the shoulder revealed there was an obvious and massive rotator cuff tear.  There were duplicated edges and thickening over the lateral aspect and this remaining portion of the rotator cuff tear was debrided.  The suture bundles were then identified and then removed with a grasper and then a check was made laterally for any loosened fragment and pieces of sutured tissue, seeing none. Attention was turned towards the subacromial space, were an aggressive debridement of multiply thickened tissues was undertaken and then an acromioplasty was performed.  Once that was completed, attention was turned towards further debridement of the duplicated cuff edges.  The glenohumeral joint did not show significant signs of arthritic issues and at this point, the shoulder was copiously and thoroughly lavaged and suctioned dry.  The arthroscopic portals were closed with a bandage.  Sterile compressive dressing was applied.  The patient was taken to the recovery room and was noted to be in satisfactory condition.  Estimated blood loss for the procedure was minimal.     Mykalah Saari L.  Luiz Blare, M.D.     Ranae Plumber  D:  07/21/2013  T:  07/21/2013  Job:  [id number]

## 2013-07-23 ENCOUNTER — Encounter (HOSPITAL_COMMUNITY): Payer: Self-pay | Admitting: Orthopedic Surgery

## 2013-07-29 ENCOUNTER — Other Ambulatory Visit (HOSPITAL_COMMUNITY): Payer: Self-pay | Admitting: Oncology

## 2013-07-29 DIAGNOSIS — E876 Hypokalemia: Secondary | ICD-10-CM

## 2013-07-29 MED ORDER — POTASSIUM CHLORIDE CRYS ER 20 MEQ PO TBCR: 20.0000 meq | EXTENDED_RELEASE_TABLET | Freq: Three times a day (TID) | ORAL | Status: DC

## 2013-08-04 ENCOUNTER — Ambulatory Visit (HOSPITAL_COMMUNITY)
Admission: RE | Admit: 2013-08-04 | Discharge: 2013-08-04 | Disposition: A | Discharge status: Home / Self Care | Payer: Medicare Other | Source: Ambulatory Visit | Attending: Orthopedic Surgery | Admitting: Orthopedic Surgery

## 2013-08-04 DIAGNOSIS — Z9889 Other specified postprocedural states: Secondary | ICD-10-CM | POA: Insufficient documentation

## 2013-08-04 DIAGNOSIS — I10 Essential (primary) hypertension: Secondary | ICD-10-CM | POA: Insufficient documentation

## 2013-08-04 DIAGNOSIS — IMO0001 Reserved for inherently not codable concepts without codable children: Secondary | ICD-10-CM | POA: Diagnosis not present

## 2013-08-04 DIAGNOSIS — M6281 Muscle weakness (generalized): Secondary | ICD-10-CM | POA: Insufficient documentation

## 2013-08-04 DIAGNOSIS — M25519 Pain in unspecified shoulder: Secondary | ICD-10-CM | POA: Diagnosis not present

## 2013-08-04 NOTE — Evaluation (Signed)
Occupational Therapy Evaluation  Patient Details  Name: Jeremy Parrish MRN: [id number] Date of Birth: 1940/12/31  Today's Date: 08/04/2013 Time: 4098-1191 OT Time Calculation (min): 26 min OT eval [phone] 16' MFR 1010-1020 10'  Visit#: 1 of 12  Re-eval: 09/01/13     Authorization: Medicare  Authorization Time Period: before 10th visit  Authorization Visit#: 1 of 10   Past Medical History:  Past Medical History  Diagnosis Date  . Hypertension   . Heart disease   . Kidney stones     history  . Lung mass   . Heart murmur   . 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 Coronary artery disease   . Depression   . Shortness of breath   . Peripheral arterial disease   . Bladder neck contracture   . Personal history of other diseases of circulatory system   . Aortic aneurysm of unspecified site without mention of rupture   . Arthritis   . Intestinovesical fistula   . Esophageal reflux   . Hyperlipidemia   . Anemia   . CHF (congestive heart failure)   . COPD (chronic obstructive pulmonary disease)   . Myocardial infarction   . Cerebral atherosclerosis     Carotid Doppler, 02/16/2013 - Bilateral Proximal ICAs,demonstrate mild plaque w/o evidence of significant diameter reduction, dissection, or any other vascular abnormality  . Complication of anesthesia   . PONV (postoperative nausea and vomiting)   . Stroke 2013    Speech.  . Cancer   . Prostate cancer 2000  . Multiple myeloma   . Hx of bladder cancer 10/07/2012  . Non Hodgkin's lymphoma    Past Surgical History:  Past Surgical History  Procedure Laterality Date  . Prostate surgery    . Heart stents x 5  1999  . Portacath placement  07/26/2009  . Wrist surgery      right  . Left ear skin cancer removed    . Bone marrow transplant  2011  . Pacemaker insertion  2013  . Coronary angioplasty    . Insert / replace / remove pacemaker    . Tee without  cardioversion  10/13/2012    Procedure: TRANSESOPHAGEAL ECHOCARDIOGRAM (TEE);  Surgeon: Jeremy Fair, MD;  Location: [facility] ENDOSCOPY;  Service: Cardiovascular;  Laterality: N/A;  pat/kay/echo notified  . Colonoscopy N/A 01/01/2013    Procedure: COLONOSCOPY;  Surgeon: Jeremy Hippo, MD;  Location: [facility] ENDO SUITE;  Service: Endoscopy;  Laterality: N/A;  825-moved to 940 Ann notified pt  . Cardiac catheterization  06/24/2000    RCA stented with a 3.5x15 stent resulting in a reduction of 60% segmental hypodense area with moderate in-stent restenosis to 0% residual  . Rotator    . Rotator cuff repair Right   . Colon surgery      colon resection  . Bladder surgery    . Shoulder arthroscopy with subacromial decompression Right 07/21/2013    Procedure: RIGHT SHOULDER ARTHROSCOPY WITH SUBACROMIAL DECOMPRESSION AND DEBRIDEMENT & Injection of Left Shoulder;  Surgeon: Jeremy Junior, MD;  Location: [facility] OR;  Service: Orthopedics;  Laterality: Right;    Subjective Symptoms/Limitations Symptoms: S: I was never 100% before the first surgery.  Pertinent History: [name] is a 72 year old male with a history of having had a previous rotator cuff repair.  He had continued to have pain.  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 Patient underwent a righth shoulder arthroscopy  on 07/21/13.    Dr. Luiz Parrish has referred patient to occupational therapy for evaluation and treatment.  Patient Stated Goals: I use arm without pain.  Pain Assessment Currently in Pain?: Yes Pain Score: 5  Pain Location: Shoulder Pain Orientation: Right Pain Type: Acute pain  Precautions/Restrictions  Precautions Precautions: Shoulder Restrictions Weight Bearing Restrictions: No  Balance Screening Balance Screen Has the patient fallen in the past 6 months: No   Assessment  08/04/13 0900  Assessment  Diagnosis Right shoulder arthroscopy  Surgical Date 07/21/13  Precautions  Precautions Shoulder   Restrictions  Weight Bearing Restrictions No  Balance Screen  Has the patient fallen in the past 6 months No  Prior Function  Level of Independence Independent with basic ADLs;Independent with gait  Able to Take Stairs? Yes  Driving Yes  Vocation Retired  Equities trader (Comment)  Comments Used to like to fish and hunt. Does not do these activities any more. Enjoys attending Jeremy Parrish football games.  ADL  ADL Comments Difficulty getting a gallon of milk from the fridge, raising his arm up to reach into cabinets  Vision - History  Baseline Vision No visual deficits  Cognition  Overall Cognitive Status Within Functional Limits for tasks assessed  Arousal/Alertness Awake/alert  Orientation Level Oriented X4  RUE Assessment  RUE Assessment (Assessed standing.  IR/ER Abducted)  RUE AROM (degrees)  Right Shoulder Flexion 140 Degrees  Right Shoulder ABduction 140 Degrees  Right Shoulder Internal Rotation 25 Degrees  Right Shoulder External Rotation 75 Degrees  RUE Strength  Right Shoulder Flexion 3/5  Right Shoulder ABduction 3/5  Right Shoulder Internal Rotation 3-/5  Right Shoulder External Rotation 3/5  Palpation  Palpation Mod fascial restrictions in right upper arm, trapezius, and scapularis region.     Exercise/Treatments     Manual Therapy Manual Therapy: Myofascial release Myofascial Release: MFR to right upper arm, trapezius, and scapularis region to decrease fascial restrictions and increase joint mobility in a pain free zone.   Occupational Therapy Assessment and Plan OT Assessment and Plan Clinical Impression Statement: A: 72 year old male s/p Right shoulder arthroscopy presents with increased pain and fascial restrictions and decreased A/PROM and strength, causing decreased I with all B/IADLs and leisure activities.  Pt will benefit from skilled therapeutic intervention in order to improve on the following deficits: Increased fascial restricitons;Decreased  range of motion;Pain;Decreased strength Rehab Potential: Excellent OT Frequency: Min 2X/week OT Duration: 6 weeks OT Treatment/Interventions: Self-care/ADL training;Therapeutic activities;Therapeutic exercise;Manual therapy;Modalities;Patient/family education OT Plan:  P: Skilled OT intervention to decrease pain and fascial restrictions and increase pain free mobility in right shoulder in order to return to prior level of independence with all B/IADLs and leisure activities. Treatment Plan: Progress as tolerated. AROM in supine, seated elev, ext, row, ball stretches.   Goals Short Term Goals Time to Complete Short Term Goals: 3 weeks Short Term Goal 1: Patient will be educated on HEP. Short Term Goal 2: Patient will have 4-/5 strength in his RUE for increased ability to participate in daily activities.  Short Term Goal 3: Patient will decrease pain in his right arm to 3/10 when participting in daily activities.  Short Term Goal 4: Patient will have min-mod fascial restrictions in his left arm.  Long Term Goals Time to Complete Long Term Goals: 6 weeks Long Term Goal 1: Patient will return to highest level of independence with all leisure and daily activities.  Long Term Goal 2: Patient will have 4+/5 strength in his RUE for increased  ability to participate in daily activities.  Long Term Goal 3: Patient will decrease pain in his right arm to 1/10 when participting in daily activities. Long Term Goal 4: Patient will have trace-min fascial restrictions in his right arm.  Long Term Goal 5: Patient will increase AROM to WNL in his righ shoulder for increased independence with reaching into overhead cabinets.   Problem List Patient Active Problem List   Diagnosis Date Noted  . Muscle weakness (generalized) 08/04/2013  . Status post arthroscopy of shoulder 08/04/2013  . Pain in joint, shoulder region 08/04/2013  . Rotator cuff tear arthropathy of right shoulder 07/21/2013  . Left rotator cuff  tear arthropathy 07/21/2013  . Shortness of breath 07/07/2013  . 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 Diarrhea 12/14/2012  . Hyperlipidemia LDL goal < 100 10/08/2012  . Prediabetes 10/08/2012  . Hypokalemia 10/08/2012  . Expressive aphasia 10/08/2012  . Hemiplegia affecting right dominant side 10/08/2012  . 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 Hx of bladder cancer 10/07/2012  . Cerebral embolism with cerebral infarction 10/06/2012  . Mute 10/06/2012  . 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 COUGH 04/26/2010  . CANDIDIASIS, ORAL 04/03/2010  . ASTHMA, UNSPECIFIED 03/22/2010  . Nonspecific (abnormal) findings on radiological and other examination of body structure 03/22/2010  . ABNORMAL LUNG XRAY 03/22/2010  . LYMPHOMA 03/21/2010  . MULTIPLE  MYELOMA 03/21/2010  . ANXIETY 03/21/2010  . HYPERTENSION 03/21/2010  . MYOCARDIAL INFARCTION 03/21/2010  . CAD, dating to 1996 with multiple PCIs, Stents to RCA and LCX, Last cath 2007 patent stents,30% prox LAD lesion, EF 45% 03/21/2010  . NEPHROLITHIASIS 03/21/2010  . ELEVATED PROSTATE SPECIFIC ANTIGEN 03/21/2010  . ROTATOR CUFF REPAIR, RIGHT, HX OF 03/21/2010    End of Session Activity Tolerance: Patient tolerated treatment well General Behavior During Therapy: WFL for tasks assessed/performed OT Plan of Care OT Home Exercise Plan: shoulder stretches OT Patient Instructions: handout- scanned Consulted and Agree with Plan of Care: Patient  GO Functional Assessment Tool Used: DASH score of 9 with ideal score of 0 Functional Limitation: Carrying, moving and handling objects Carrying, Moving and Handling Objects Current Status (W0981): At least 1 percent but less than 20 percent impaired, limited or restricted Carrying, Moving and Handling Objects Goal Status [phone]): 0 percent impaired, limited or restricted  Limmie Patricia, OTR/L,CBIS   08/04/2013, 1:27  PM  Physician Documentation Your signature is required to indicate approval of the treatment plan as stated above.  Please sign and either send electronically or make a copy of this report for your files and return this physician signed original.  Please mark one 1.__approve of plan  2. ___approve of plan with the following conditions.   ______________________________                                                          _____________________ Physician Signature  Date  

## 2013-08-05 ENCOUNTER — Other Ambulatory Visit (HOSPITAL_COMMUNITY): Payer: Self-pay

## 2013-08-05 DIAGNOSIS — C8589 Other specified types of non-Hodgkin lymphoma, extranodal and solid organ sites: Secondary | ICD-10-CM

## 2013-08-05 DIAGNOSIS — C9000 Multiple myeloma not having achieved remission: Secondary | ICD-10-CM

## 2013-08-06 ENCOUNTER — Ambulatory Visit (HOSPITAL_COMMUNITY)
Admission: RE | Admit: 2013-08-06 | Discharge: 2013-08-06 | Disposition: A | Discharge status: Home / Self Care | Payer: Medicare Other | Source: Ambulatory Visit | Attending: Family Medicine | Admitting: Family Medicine

## 2013-08-06 DIAGNOSIS — Z9889 Other specified postprocedural states: Secondary | ICD-10-CM

## 2013-08-06 DIAGNOSIS — M6281 Muscle weakness (generalized): Secondary | ICD-10-CM

## 2013-08-06 DIAGNOSIS — M25511 Pain in right shoulder: Secondary | ICD-10-CM

## 2013-08-06 NOTE — Progress Notes (Signed)
Occupational Therapy Treatment Patient Details  Name: Jeremy Parrish MRN: [id number] Date of Birth: September 13, 1941  Today's Date: 08/06/2013 Time: 5784-6962 OT Time Calculation (min): 38 min Manual Therapy 1140-1156 16' Therapeutic exercises [phone] 22' Visit#: 2 of 12  Re-eval: 09/01/13    Authorization: Medicare  Authorization Time Period: before 10th visit  Authorization Visit#: 2 of 10  Subjective  S:  I have been doing my exercises, my incision is draining  a bit and the MD gave me some antibiotics Pain Assessment Currently in Pain?: Yes Pain Score: 3  Pain Location: Shoulder Pain Orientation: Right Pain Type: Acute pain  Precautions/Restrictions   progress as tolerated  Exercise/Treatments Supine Protraction: PROM;AAROM;10 reps Horizontal ABduction: PROM;AAROM;10 reps External Rotation: PROM;AAROM;10 reps Internal Rotation: PROM;AAROM;10 reps Flexion: PROM;AAROM;10 reps ABduction: PROM;AAROM;10 reps Seated Elevation: AROM;15 reps Extension: AROM;15 reps Row: AROM;15 reps Protraction: AAROM;10 reps Horizontal ABduction: AAROM;10 reps External Rotation: AAROM;10 reps Internal Rotation: AAROM;10 reps Flexion: AAROM;10 reps Abduction: AAROM;10 reps Therapy Ball Flexion: 20 reps ABduction: 20 reps Right/Left: 5 reps ROM / Strengthening / Isometric Strengthening Thumb Tacks: 1' Prot/Ret//Elev/Dep: 1'      Manual Therapy Manual Therapy: Myofascial release Myofascial Release: MFR to right upper arm, trapezius, and scapularis region to decrease fascial restrictions and increase joint mobility in a pain free zone.   Occupational Therapy Assessment and Plan OT Assessment and Plan Clinical Impression Statement: A:  Added AAROM in supine and seated, demonstrated full AAROM in both positions, and required vg to slow rate of completion - stressing quality vs quantity. OT Plan: P:  Increase I with prot/ret//elev/dep, and attempt AROM in supine.   Goals Short Term  Goals Short Term Goal 1: Patient will be educated on HEP. Short Term Goal 1 Progress: Progressing toward goal Short Term Goal 2: Patient will have 4-/5 strength in his RUE for increased ability to participate in daily activities.  Short Term Goal 2 Progress: Progressing toward goal Short Term Goal 3: Patient will decrease pain in his right arm to 3/10 when participting in daily activities.  Short Term Goal 3 Progress: Progressing toward goal Short Term Goal 4: Patient will have min-mod fascial restrictions in his left arm.  Short Term Goal 4 Progress: Progressing toward goal Long Term Goals Long Term Goal 1: Patient will return to highest level of independence with all leisure and daily activities.  Long Term Goal 1 Progress: Progressing toward goal Long Term Goal 2: Patient will have 4+/5 strength in his RUE for increased ability to participate in daily activities.  Long Term Goal 2 Progress: Progressing toward goal Long Term Goal 3: Patient will decrease pain in his right arm to 1/10 when participting in daily activities. Long Term Goal 3 Progress: Progressing toward goal Long Term Goal 4: Patient will have trace-min fascial restrictions in his right arm.  Long Term Goal 4 Progress: Progressing toward goal Long Term Goal 5: Patient will increase AROM to WNL in his righ shoulder for increased independence with reaching into overhead cabinets.  Long Term Goal 5 Progress: Progressing toward goal  Problem List Patient Active Problem List   Diagnosis Date Noted  . Muscle weakness (generalized) 08/04/2013  . Status post arthroscopy of shoulder 08/04/2013  . Pain in joint, shoulder region 08/04/2013  . Rotator cuff tear arthropathy of right shoulder 07/21/2013  . Left rotator cuff tear arthropathy 07/21/2013  . Shortness of breath 07/07/2013  . 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 Diarrhea 12/14/2012  . Hyperlipidemia LDL goal < 100  10/08/2012  . Prediabetes 10/08/2012  . Hypokalemia  10/08/2012  . Expressive aphasia 10/08/2012  . Hemiplegia affecting right dominant side 10/08/2012  . 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 Hx of bladder cancer 10/07/2012  . Cerebral embolism with cerebral infarction 10/06/2012  . Mute 10/06/2012  . 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 COUGH 04/26/2010  . CANDIDIASIS, ORAL 04/03/2010  . ASTHMA, UNSPECIFIED 03/22/2010  . Nonspecific (abnormal) findings on radiological and other examination of body structure 03/22/2010  . ABNORMAL LUNG XRAY 03/22/2010  . LYMPHOMA 03/21/2010  . MULTIPLE  MYELOMA 03/21/2010  . ANXIETY 03/21/2010  . HYPERTENSION 03/21/2010  . MYOCARDIAL INFARCTION 03/21/2010  . CAD, dating to 1996 with multiple PCIs, Stents to RCA and LCX, Last cath 2007 patent stents,30% prox LAD lesion, EF 45% 03/21/2010  . NEPHROLITHIASIS 03/21/2010  . ELEVATED PROSTATE SPECIFIC ANTIGEN 03/21/2010  . ROTATOR CUFF REPAIR, RIGHT, HX OF 03/21/2010    End of Session Activity Tolerance: Patient tolerated treatment well General Behavior During Therapy: [facility] for tasks assessed/performed  GO    Shirlean Mylar, OTR/L  08/06/2013, 12:22 PM

## 2013-08-09 ENCOUNTER — Encounter (HOSPITAL_BASED_OUTPATIENT_CLINIC_OR_DEPARTMENT_OTHER): Payer: Medicare Other

## 2013-08-09 ENCOUNTER — Ambulatory Visit (HOSPITAL_COMMUNITY)
Admission: RE | Admit: 2013-08-09 | Discharge: 2013-08-09 | Disposition: A | Discharge status: Home / Self Care | Payer: Medicare Other | Source: Ambulatory Visit | Attending: Orthopedic Surgery | Admitting: Orthopedic Surgery

## 2013-08-09 DIAGNOSIS — C9000 Multiple myeloma not having achieved remission: Secondary | ICD-10-CM | POA: Diagnosis not present

## 2013-08-09 DIAGNOSIS — C8589 Other specified types of non-Hodgkin lymphoma, extranodal and solid organ sites: Secondary | ICD-10-CM

## 2013-08-09 LAB — CBC WITH DIFFERENTIAL/PLATELET
Basophils Relative: 1 % (ref 0–1)
Eosinophils Absolute: 0.2 10*3/uL (ref 0.0–0.7)
Eosinophils Relative: 3 % (ref 0–5)
Hemoglobin: 13 g/dL (ref 13.0–17.0)
Lymphs Abs: 1.4 10*3/uL (ref 0.7–4.0)
MCH: 29.1 pg (ref 26.0–34.0)
MCHC: 32.9 g/dL (ref 30.0–36.0)
MCV: 88.6 fL (ref 78.0–100.0)
Monocytes Absolute: 0.5 10*3/uL (ref 0.1–1.0)
Monocytes Relative: 10 % (ref 3–12)
RBC: 4.46 MIL/uL (ref 4.22–5.81)

## 2013-08-09 LAB — COMPREHENSIVE METABOLIC PANEL
Alkaline Phosphatase: 77 U/L (ref 39–117)
BUN: 12 mg/dL (ref 6–23)
Calcium: 9.6 mg/dL (ref 8.4–10.5)
Creatinine, Ser: 0.84 mg/dL (ref 0.50–1.35)
GFR calc Af Amer: >90 mL/min (ref >90)
Glucose, Bld: 88 mg/dL (ref 70–99)
Total Protein: 6.5 g/dL (ref 6.0–8.3)

## 2013-08-09 LAB — LACTATE DEHYDROGENASE: LDH: 210 U/L (ref 94–250)

## 2013-08-09 NOTE — Progress Notes (Signed)
Occupational Therapy Treatment Patient Details  Name: Jeremy Parrish MRN: [id number] Date of Birth: 1941-10-30  Today's Date: 08/09/2013 Time: 1022-1057 OT Time Calculation (min): 35 min Manual therapy 3086-5784 9' Therapeutic exercises 1031-1057 26' Visit#: 3 of 12  Re-eval: 09/01/13    Authorization: Medicare  Authorization Time Period: before 10th visit  Authorization Visit#: 3 of 10  Subjective S:  Its not draining anymore.  I go back to MD for him to look at the incision tomorrow.  I mowed this weekend, I think it made it a little sore.   Limitations: progress as tolerated  Pain Assessment Currently in Pain?: Yes Pain Score: 5  Pain Location: Shoulder Pain Orientation: Right Pain Type: Acute pain  Precautions/Restrictions   progress as tolerated   Exercise/Treatments Supine Protraction: PROM;10 reps;AAROM;15 reps Horizontal ABduction: PROM;12 reps;AAROM;15 reps External Rotation: PROM;10 reps;AAROM;15 reps Internal Rotation: PROM;10 reps;AAROM;15 reps Flexion: PROM;10 reps;AAROM;15 reps ABduction: PROM;10 reps;AAROM;15 reps Seated Elevation: AROM;15 reps Protraction: AAROM;15 reps Horizontal ABduction: AAROM;15 reps External Rotation: AAROM;15 reps Internal Rotation: AAROM;15 reps Flexion: AAROM;15 reps Abduction: AAROM;15 reps Standing External Rotation: Theraband;10 reps Theraband Level (Shoulder External Rotation): Level 2 (Red) Internal Rotation: Theraband;10 reps Theraband Level (Shoulder Internal Rotation): Level 2 (Red) Extension: Theraband;10 reps Theraband Level (Shoulder Extension): Level 2 (Red) Row: Theraband;10 reps Theraband Level (Shoulder Row): Level 2 (Red) Therapy Ball Flexion: 20 reps ABduction: 20 reps Right/Left: 5 reps ROM / Strengthening / Isometric Strengthening Wall Wash: 2' Thumb Tacks: 1' Prot/Ret//Elev/Dep: 1'      Manual Therapy Manual Therapy: Myofascial release Myofascial Release: MFR to right upper arm, trapezius,  and scapularis region to decrease fascial restrictions and increase joint mobility in a pain free zone.   Occupational Therapy Assessment and Plan OT Assessment and Plan Clinical Impression Statement: A:  Patient with increased pain in his right shoulder possibly due to increased acitivty level ovefr the weekend, therefore completed AAROM vs planned AROM this date.  OT Plan: P:  Attempt AROM in supine if pain level is less than 4/10.   Goals Short Term Goals Short Term Goal 1: Patient will be educated on HEP. Short Term Goal 1 Progress: Progressing toward goal Short Term Goal 2: Patient will have 4-/5 strength in his RUE for increased ability to participate in daily activities.  Short Term Goal 2 Progress: Progressing toward goal Short Term Goal 3: Patient will decrease pain in his right arm to 3/10 when participting in daily activities.  Short Term Goal 3 Progress: Progressing toward goal Short Term Goal 4: Patient will have min-mod fascial restrictions in his left arm.  Short Term Goal 4 Progress: Progressing toward goal Long Term Goals Long Term Goal 1: Patient will return to highest level of independence with all leisure and daily activities.  Long Term Goal 1 Progress: Progressing toward goal Long Term Goal 2: Patient will have 4+/5 strength in his RUE for increased ability to participate in daily activities.  Long Term Goal 2 Progress: Progressing toward goal Long Term Goal 3: Patient will decrease pain in his right arm to 1/10 when participting in daily activities. Long Term Goal 3 Progress: Progressing toward goal Long Term Goal 4: Patient will have trace-min fascial restrictions in his right arm.  Long Term Goal 4 Progress: Progressing toward goal Long Term Goal 5: Patient will increase AROM to WNL in his righ shoulder for increased independence with reaching into overhead cabinets.  Long Term Goal 5 Progress: Progressing toward goal  Problem List Patient Active Problem List  Diagnosis Date Noted  . Muscle weakness (generalized) 08/04/2013  . Status post arthroscopy of shoulder 08/04/2013  . Pain in joint, shoulder region 08/04/2013  . Rotator cuff tear arthropathy of right shoulder 07/21/2013  . Left rotator cuff tear arthropathy 07/21/2013  . Shortness of breath 07/07/2013  . 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 Diarrhea 12/14/2012  . Hyperlipidemia LDL goal < 100 10/08/2012  . Prediabetes 10/08/2012  . Hypokalemia 10/08/2012  . Expressive aphasia 10/08/2012  . Hemiplegia affecting right dominant side 10/08/2012  . 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 Hx of bladder cancer 10/07/2012  . Cerebral embolism with cerebral infarction 10/06/2012  . Mute 10/06/2012  . 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 COUGH 04/26/2010  . CANDIDIASIS, ORAL 04/03/2010  . ASTHMA, UNSPECIFIED 03/22/2010  . Nonspecific (abnormal) findings on radiological and other examination of body structure 03/22/2010  . ABNORMAL LUNG XRAY 03/22/2010  . LYMPHOMA 03/21/2010  . MULTIPLE  MYELOMA 03/21/2010  . ANXIETY 03/21/2010  . HYPERTENSION 03/21/2010  . MYOCARDIAL INFARCTION 03/21/2010  . CAD, dating to 1996 with multiple PCIs, Stents to RCA and LCX, Last cath 2007 patent stents,30% prox LAD lesion, EF 45% 03/21/2010  . NEPHROLITHIASIS 03/21/2010  . ELEVATED PROSTATE SPECIFIC ANTIGEN 03/21/2010  . ROTATOR CUFF REPAIR, RIGHT, HX OF 03/21/2010    End of Session Activity Tolerance: Patient tolerated treatment well General Behavior During Therapy: [facility] for tasks assessed/performed  GO    Shirlean Mylar, OTR/L  08/09/2013, 10:58 AM

## 2013-08-09 NOTE — Progress Notes (Signed)
Labs drawn today for cbc/diff,cmp,ldh,mm panel,kllc

## 2013-08-10 ENCOUNTER — Other Ambulatory Visit (HOSPITAL_COMMUNITY): Payer: Medicare Other

## 2013-08-11 ENCOUNTER — Encounter (HOSPITAL_COMMUNITY): Payer: Medicare Other | Attending: Oncology

## 2013-08-11 ENCOUNTER — Ambulatory Visit (HOSPITAL_COMMUNITY): Payer: Medicare Other | Admitting: Oncology

## 2013-08-11 VITALS — BP 143/74 | HR 90 | Temp 97.9°F | Resp 14

## 2013-08-11 DIAGNOSIS — C8589 Other specified types of non-Hodgkin lymphoma, extranodal and solid organ sites: Secondary | ICD-10-CM | POA: Insufficient documentation

## 2013-08-11 DIAGNOSIS — M12519 Traumatic arthropathy, unspecified shoulder: Secondary | ICD-10-CM | POA: Insufficient documentation

## 2013-08-11 DIAGNOSIS — D801 Nonfamilial hypogammaglobulinemia: Secondary | ICD-10-CM | POA: Insufficient documentation

## 2013-08-11 DIAGNOSIS — C9000 Multiple myeloma not having achieved remission: Secondary | ICD-10-CM | POA: Diagnosis not present

## 2013-08-11 DIAGNOSIS — J069 Acute upper respiratory infection, unspecified: Secondary | ICD-10-CM | POA: Insufficient documentation

## 2013-08-11 DIAGNOSIS — Z0000 Encounter for general adult medical examination without abnormal findings: Secondary | ICD-10-CM | POA: Insufficient documentation

## 2013-08-11 MED ORDER — SODIUM CHLORIDE 0.9 % IV SOLN
Freq: Once | INTRAVENOUS | Status: AC
  Administered 2013-08-11: 12:00:00 via INTRAVENOUS

## 2013-08-11 MED ORDER — SODIUM CHLORIDE 0.9 % IJ SOLN
10.0000 mL | INTRAMUSCULAR | Status: DC | PRN
  Administered 2013-08-11: 10 mL

## 2013-08-11 MED ORDER — HEPARIN SOD (PORK) LOCK FLUSH 100 UNIT/ML IV SOLN
500.0000 [IU] | Freq: Once | INTRAVENOUS | Status: AC | PRN
  Administered 2013-08-11: 500 [IU]
  Filled 2013-08-11: qty 5

## 2013-08-11 MED ORDER — ZOLEDRONIC ACID 4 MG/5ML IV CONC
4.0000 mg | Freq: Once | INTRAVENOUS | Status: AC
  Administered 2013-08-11: 4 mg via INTRAVENOUS
  Filled 2013-08-11: qty 5

## 2013-08-12 ENCOUNTER — Other Ambulatory Visit (HOSPITAL_COMMUNITY): Payer: Self-pay | Admitting: Oncology

## 2013-08-12 ENCOUNTER — Ambulatory Visit (HOSPITAL_COMMUNITY)
Admission: RE | Admit: 2013-08-12 | Discharge: 2013-08-12 | Disposition: A | Discharge status: Home / Self Care | Payer: Medicare Other | Source: Ambulatory Visit | Attending: Family Medicine | Admitting: Family Medicine

## 2013-08-12 DIAGNOSIS — M25519 Pain in unspecified shoulder: Secondary | ICD-10-CM | POA: Diagnosis not present

## 2013-08-12 DIAGNOSIS — I10 Essential (primary) hypertension: Secondary | ICD-10-CM | POA: Insufficient documentation

## 2013-08-12 DIAGNOSIS — IMO0001 Reserved for inherently not codable concepts without codable children: Secondary | ICD-10-CM | POA: Insufficient documentation

## 2013-08-12 DIAGNOSIS — M6281 Muscle weakness (generalized): Secondary | ICD-10-CM | POA: Diagnosis not present

## 2013-08-12 DIAGNOSIS — C9000 Multiple myeloma not having achieved remission: Secondary | ICD-10-CM

## 2013-08-12 LAB — MULTIPLE MYELOMA PANEL, SERUM
Albumin ELP: 62.2 % (ref 55.8–66.1)
Alpha-1-Globulin: 5.3 % — ABNORMAL HIGH (ref 2.9–4.9)
Gamma Globulin: 9.6 % — ABNORMAL LOW (ref 11.1–18.8)
IgA: 174 mg/dL (ref 68–379)
IgG (Immunoglobin G), Serum: 586 mg/dL — ABNORMAL LOW (ref 650–1600)
Total Protein: 6.1 g/dL (ref 6.0–8.3)

## 2013-08-12 MED ORDER — LENALIDOMIDE 10 MG PO CAPS: 10.0000 mg | ORAL_CAPSULE | Freq: Every day | ORAL | Status: DC

## 2013-08-12 NOTE — Progress Notes (Signed)
Occupational Therapy Treatment Patient Details  Name: Jeremy Parrish MRN: [id number] Date of Birth: 01/06/1941  Today's Date: 08/12/2013 Time: 4098-1191 OT Time Calculation (min): 42 min MFR [phone] 9' Therex [phone] 33'  Visit#: 4 of 12  Re-eval: 09/01/13    Authorization: Medicare  Authorization Time Period: before 10th visit  Authorization Visit#: 4 of 10  Subjective Symptoms/Limitations Symptoms: S: I can reach across my body and reach my left shoulder now. It hurts a little bit.   Pain Assessment Currently in Pain?: Yes Pain Score: 4  Pain Location: Shoulder Pain Orientation: Right Pain Type: Acute pain  Precautions/Restrictions  Precautions Precautions: Shoulder  Exercise/Treatments Supine Protraction: PROM;10 reps;AROM;15 reps Horizontal ABduction: PROM;10 reps;AROM;15 reps External Rotation: PROM;10 reps;AROM;15 reps Internal Rotation: PROM;10 reps;AROM;15 reps Flexion: PROM;10 reps;AROM;15 reps ABduction: PROM;10 reps;AROM;15 reps Seated Protraction: AAROM;15 reps Horizontal ABduction: AAROM;15 reps External Rotation: AAROM;15 reps Internal Rotation: AAROM;15 reps Flexion: AAROM;15 reps Abduction: AAROM;15 reps Standing External Rotation: Theraband;10 reps Theraband Level (Shoulder External Rotation): Level 2 (Red) Internal Rotation: Theraband;10 reps Theraband Level (Shoulder Internal Rotation): Level 2 (Red) Extension: Theraband;10 reps Theraband Level (Shoulder Extension): Level 2 (Red) Row: Theraband;10 reps Theraband Level (Shoulder Row): Level 2 (Red) Therapy Ball Flexion: 20 reps ABduction: 20 reps Right/Left: 5 reps ROM / Strengthening / Isometric Strengthening Wall Wash: 2' Proximal Shoulder Strengthening, Supine: 10X        Manual Therapy Manual Therapy: Myofascial release Myofascial Release: MFR to right upper arm, trapezius, and scapularis region to decrease fascial restrictions and increase joint mobility in a pain free zone.    Occupational Therapy Assessment and Plan OT Assessment and Plan Clinical Impression Statement: A: Added AROM supine and proximal shoulder strengthening. Patient tolerated well.  OT Plan: P: Progress to UBE when able.    Goals Short Term Goals Short Term Goal 1: Patient will be educated on HEP. Short Term Goal 2: Patient will have 4-/5 strength in his RUE for increased ability to participate in daily activities.  Short Term Goal 3: Patient will decrease pain in his right arm to 3/10 when participting in daily activities.  Short Term Goal 4: Patient will have min-mod fascial restrictions in his left arm.  Long Term Goals Long Term Goal 1: Patient will return to highest level of independence with all leisure and daily activities.  Long Term Goal 2: Patient will have 4+/5 strength in his RUE for increased ability to participate in daily activities.  Long Term Goal 3: Patient will decrease pain in his right arm to 1/10 when participting in daily activities. Long Term Goal 4: Patient will have trace-min fascial restrictions in his right arm.  Long Term Goal 5: Patient will increase AROM to WNL in his righ shoulder for increased independence with reaching into overhead cabinets.   Problem List Patient Active Problem List   Diagnosis Date Noted  . Muscle weakness (generalized) 08/04/2013  . Status post arthroscopy of shoulder 08/04/2013  . Pain in joint, shoulder region 08/04/2013  . Rotator cuff tear arthropathy of right shoulder 07/21/2013  . Left rotator cuff tear arthropathy 07/21/2013  . Shortness of breath 07/07/2013  . 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 Diarrhea 12/14/2012  . Hyperlipidemia LDL goal < 100 10/08/2012  . Prediabetes 10/08/2012  . Hypokalemia 10/08/2012  . Expressive aphasia 10/08/2012  . Hemiplegia affecting right dominant side 10/08/2012  . 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  Hx of bladder cancer 10/07/2012  .  Cerebral embolism with cerebral infarction 10/06/2012  . Mute 10/06/2012  . 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 COUGH 04/26/2010  . CANDIDIASIS, ORAL 04/03/2010  . ASTHMA, UNSPECIFIED 03/22/2010  . Nonspecific (abnormal) findings on radiological and other examination of body structure 03/22/2010  . ABNORMAL LUNG XRAY 03/22/2010  . LYMPHOMA 03/21/2010  . MULTIPLE  MYELOMA 03/21/2010  . ANXIETY 03/21/2010  . HYPERTENSION 03/21/2010  . MYOCARDIAL INFARCTION 03/21/2010  . CAD, dating to 1996 with multiple PCIs, Stents to RCA and LCX, Last cath 2007 patent stents,30% prox LAD lesion, EF 45% 03/21/2010  . NEPHROLITHIASIS 03/21/2010  . ELEVATED PROSTATE SPECIFIC ANTIGEN 03/21/2010  . ROTATOR CUFF REPAIR, RIGHT, HX OF 03/21/2010    End of Session Activity Tolerance: Patient tolerated treatment well General Behavior During Therapy: [facility] for tasks assessed/performed   Limmie Patricia, OTR/L,CBIS   08/12/2013, 10:19 AM

## 2013-08-16 ENCOUNTER — Ambulatory Visit (HOSPITAL_COMMUNITY)
Admission: RE | Admit: 2013-08-16 | Discharge: 2013-08-16 | Disposition: A | Discharge status: Home / Self Care | Payer: Medicare Other | Source: Ambulatory Visit | Attending: Family Medicine | Admitting: Family Medicine

## 2013-08-16 DIAGNOSIS — I10 Essential (primary) hypertension: Secondary | ICD-10-CM | POA: Diagnosis not present

## 2013-08-16 DIAGNOSIS — IMO0001 Reserved for inherently not codable concepts without codable children: Secondary | ICD-10-CM | POA: Diagnosis not present

## 2013-08-16 DIAGNOSIS — M6281 Muscle weakness (generalized): Secondary | ICD-10-CM | POA: Diagnosis not present

## 2013-08-16 DIAGNOSIS — M25519 Pain in unspecified shoulder: Secondary | ICD-10-CM | POA: Diagnosis not present

## 2013-08-16 NOTE — Progress Notes (Signed)
Occupational Therapy Treatment Patient Details  Name: Jeremy Parrish MRN: [id number] Date of Birth: Nov 14, 1940  Today's Date: 08/16/2013 Time: 8657-8469 OT Time Calculation (min): 44 min Manual Therapy [phone] 14' Therapeutic exercises [phone] 30' Visit#: 5 of 12  Re-eval: 09/01/13    Authorization: Medicare  Authorization Time Period: before 10th visit  Authorization Visit#: 5 of 10  Subjective S:  Its a little sore Im not sure if its the change in weather or not. Limitations: progress as tolerated  Pain Assessment Currently in Pain?: Yes Pain Score: 5  Pain Location: Shoulder Pain Orientation: Right Pain Type: Acute pain  Precautions/Restrictions   progress as tolerated  Exercise/Treatments Supine Protraction: PROM;10 reps;AROM;15 reps Horizontal ABduction: PROM;10 reps;AROM;15 reps External Rotation: PROM;10 reps;AROM;15 reps Internal Rotation: PROM;10 reps;AROM;15 reps Flexion: PROM;10 reps;AROM;15 reps ABduction: PROM;10 reps;AROM;15 reps Seated Protraction: AROM;10 reps Horizontal ABduction: AROM;10 reps External Rotation: AROM;10 reps Internal Rotation: AROM;10 reps Flexion: AROM;10 reps Abduction: AROM;10 reps Standing External Rotation: Theraband;10 reps Theraband Level (Shoulder External Rotation): Level 2 (Red) Internal Rotation: Theraband;10 reps Theraband Level (Shoulder Internal Rotation): Level 2 (Red) Extension: Theraband;10 reps Theraband Level (Shoulder Extension): Level 2 (Red) Row: Theraband;10 reps Theraband Level (Shoulder Row): Level 2 (Red) Therapy Ball Right/Left: 5 reps ROM / Strengthening / Isometric Strengthening Wall Wash: 3' Proximal Shoulder Strengthening, Supine: 12X Proximal Shoulder Strengthening, Seated: 10X       Manual Therapy Manual Therapy: Myofascial release Myofascial Release: MFR to right upper arm, trapezius, and scapularis region to decrease fascial restrictions and increase joint mobility in a pain free  zone.   Occupational Therapy Assessment and Plan OT Assessment and Plan Clinical Impression Statement: A:  Added AROM in seated, patient demonstrated good range with exercises  OT Plan: P:  Add UBE and strengthening in supine.    Goals    Problem List Patient Active Problem List   Diagnosis Date Noted  . Muscle weakness (generalized) 08/04/2013  . Status post arthroscopy of shoulder 08/04/2013  . Pain in joint, shoulder region 08/04/2013  . Rotator cuff tear arthropathy of right shoulder 07/21/2013  . Left rotator cuff tear arthropathy 07/21/2013  . Shortness of breath 07/07/2013  . 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 Diarrhea 12/14/2012  . Hyperlipidemia LDL goal < 100 10/08/2012  . Prediabetes 10/08/2012  . Hypokalemia 10/08/2012  . Expressive aphasia 10/08/2012  . Hemiplegia affecting right dominant side 10/08/2012  . 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 Hx of bladder cancer 10/07/2012  . Cerebral embolism with cerebral infarction 10/06/2012  . Mute 10/06/2012  . 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 COUGH 04/26/2010  . CANDIDIASIS, ORAL 04/03/2010  . ASTHMA, UNSPECIFIED 03/22/2010  . Nonspecific (abnormal) findings on radiological and other examination of body structure 03/22/2010  . ABNORMAL LUNG XRAY 03/22/2010  . LYMPHOMA 03/21/2010  . MULTIPLE  MYELOMA 03/21/2010  . ANXIETY 03/21/2010  . HYPERTENSION 03/21/2010  . MYOCARDIAL INFARCTION 03/21/2010  . CAD, dating to 1996 with multiple PCIs, Stents to RCA and LCX, Last cath 2007 patent stents,30% prox LAD lesion, EF 45% 03/21/2010  . NEPHROLITHIASIS 03/21/2010  . ELEVATED PROSTATE SPECIFIC ANTIGEN 03/21/2010  . ROTATOR CUFF REPAIR, RIGHT, HX OF 03/21/2010    End of Session Activity Tolerance: Patient tolerated treatment well General Behavior During Therapy: [facility] for tasks assessed/performed  GO    Shirlean Mylar,  OTR/L  08/16/2013, 10:46 AM

## 2013-08-19 ENCOUNTER — Ambulatory Visit (HOSPITAL_COMMUNITY)
Admission: RE | Admit: 2013-08-19 | Discharge: 2013-08-19 | Disposition: A | Discharge status: Home / Self Care | Payer: Medicare Other | Source: Ambulatory Visit | Attending: Family Medicine | Admitting: Family Medicine

## 2013-08-19 DIAGNOSIS — IMO0001 Reserved for inherently not codable concepts without codable children: Secondary | ICD-10-CM | POA: Diagnosis not present

## 2013-08-19 DIAGNOSIS — I10 Essential (primary) hypertension: Secondary | ICD-10-CM | POA: Diagnosis not present

## 2013-08-19 DIAGNOSIS — M25519 Pain in unspecified shoulder: Secondary | ICD-10-CM | POA: Diagnosis not present

## 2013-08-19 DIAGNOSIS — M6281 Muscle weakness (generalized): Secondary | ICD-10-CM | POA: Diagnosis not present

## 2013-08-19 NOTE — Progress Notes (Signed)
Occupational Therapy Treatment Patient Details  Name: Jeremy Parrish MRN: [id number] Date of Birth: 01/13/1941  Today's Date: 08/19/2013 Time: 4696-2952 OT Time Calculation (min): 57 min Manual therapy 8413-2440 20 min  Ther Exercise [phone] 27 min  Visit#: 6 of 12  Re-eval: 09/01/13    Authorization: Medicare  Authorization Time Period: before 10th visit  Authorization Visit#: 6 of 10  Subjective Symptoms: S: Its been hurting alot more the last two days and expecially last night  Limitations: progress as tolerated  Pain Assessment Currently in Pain?: Yes Pain Score: 8  Pain Location: Shoulder Pain Orientation: Right Pain Type: Acute pain Multiple Pain Sites: No      Exercise/Treatments Supine Protraction: PROM;10 reps (; AROM 10 reps ) Horizontal ABduction: PROM;10 reps (; AROM 10reps ) External Rotation: PROM;10 reps (:AROM 10 reps ) Internal Rotation: PROM;10 reps (;AROM 10 reps ) Flexion: PROM;10 reps (; AROM 10 reps) ABduction: PROM;10 reps (; AROM 10 reps ) Seated Elevation: AROM;10 reps Protraction: AROM;10 reps Horizontal ABduction: AROM;10 reps External Rotation: AROM;10 reps Internal Rotation: AROM;10 reps Flexion: AROM;10 reps Abduction: AROM;10 reps        Standing External Rotation: 10 reps;Theraband Theraband Level (Shoulder External Rotation): Level 2 (Red) Internal Rotation: 10 reps;Theraband Theraband Level (Shoulder Internal Rotation): Level 2 (Red) Extension: 10 reps;Theraband Theraband Level (Shoulder Extension): Level 2 (Red) Row: 10 reps;Theraband Theraband Level (Shoulder Row): Level 2 (Red) Retraction: 10 reps;Theraband Theraband Level (Shoulder Retraction): Level 2 (Red)    Therapy Ball ABduction: 20 reps Right/Left: 5 reps ROM / Strengthening / Isometric Strengthening UBE (Upper Arm Bike): 3' back, 3' forward Thumb Tacks: 2'      Manual Therapy Manual Therapy: Myofascial release Myofascial Release: MFR to right  upper arm, trapezius, and scapularis region to decrease fascial restrictions and increase joint mobility in a pain free zone.  Occupational Therapy Assessment and Plan OT Assessment and Plan Clinical Impression Statement: A: Added UBE this date in which pt states he feels as though it allows his shoulder to relax.  Pt with c/o pain 5/10 at end of treatment session this date.  OT Plan: P: strengthening in supine   Goals Short Term Goals Short Term Goal 1: Patient will be educated on HEP. Short Term Goal 1 Progress: Progressing toward goal Short Term Goal 2: Patient will have 4-/5 strength in his RUE for increased ability to participate in daily activities.  Short Term Goal 2 Progress: Progressing toward goal Short Term Goal 3: Patient will decrease pain in his right arm to 3/10 when participting in daily activities.  Short Term Goal 3 Progress: Progressing toward goal Short Term Goal 4: Patient will have min-mod fascial restrictions in his left arm.  Short Term Goal 4 Progress: Progressing toward goal Long Term Goals Long Term Goal 1: Patient will return to highest level of independence with all leisure and daily activities.  Long Term Goal 1 Progress: Progressing toward goal Long Term Goal 2: Patient will have 4+/5 strength in his RUE for increased ability to participate in daily activities.  Long Term Goal 2 Progress: Progressing toward goal Long Term Goal 3: Patient will decrease pain in his right arm to 1/10 when participting in daily activities. Long Term Goal 3 Progress: Progressing toward goal Long Term Goal 4: Patient will have trace-min fascial restrictions in his right arm.  Long Term Goal 4 Progress: Progressing toward goal Long Term Goal 5: Patient will increase AROM to WNL in his righ shoulder for increased independence with  reaching into overhead cabinets.  Long Term Goal 5 Progress: Progressing toward goal  Problem List Patient Active Problem List   Diagnosis Date Noted   . Muscle weakness (generalized) 08/04/2013  . Status post arthroscopy of shoulder 08/04/2013  . Pain in joint, shoulder region 08/04/2013  . Rotator cuff tear arthropathy of right shoulder 07/21/2013  . Left rotator cuff tear arthropathy 07/21/2013  . Shortness of breath 07/07/2013  . 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 Diarrhea 12/14/2012  . Hyperlipidemia LDL goal < 100 10/08/2012  . Prediabetes 10/08/2012  . Hypokalemia 10/08/2012  . Expressive aphasia 10/08/2012  . Hemiplegia affecting right dominant side 10/08/2012  . 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 Hx of bladder cancer 10/07/2012  . Cerebral embolism with cerebral infarction 10/06/2012  . Mute 10/06/2012  . 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 COUGH 04/26/2010  . CANDIDIASIS, ORAL 04/03/2010  . ASTHMA, UNSPECIFIED 03/22/2010  . Nonspecific (abnormal) findings on radiological and other examination of body structure 03/22/2010  . ABNORMAL LUNG XRAY 03/22/2010  . LYMPHOMA 03/21/2010  . MULTIPLE  MYELOMA 03/21/2010  . ANXIETY 03/21/2010  . HYPERTENSION 03/21/2010  . MYOCARDIAL INFARCTION 03/21/2010  . CAD, dating to 1996 with multiple PCIs, Stents to RCA and LCX, Last cath 2007 patent stents,30% prox LAD lesion, EF 45% 03/21/2010  . NEPHROLITHIASIS 03/21/2010  . ELEVATED PROSTATE SPECIFIC ANTIGEN 03/21/2010  . ROTATOR CUFF REPAIR, RIGHT, HX OF 03/21/2010    End of Session Activity Tolerance: Patient tolerated treatment well General Behavior During Therapy: [facility] for tasks assessed/performed  GO    Velora Mediate, OTR/L  08/19/2013, 10:54 AM

## 2013-08-20 ENCOUNTER — Encounter (HOSPITAL_BASED_OUTPATIENT_CLINIC_OR_DEPARTMENT_OTHER): Payer: Medicare Other | Admitting: Oncology

## 2013-08-20 ENCOUNTER — Encounter (HOSPITAL_COMMUNITY): Payer: Self-pay | Admitting: Oncology

## 2013-08-20 VITALS — BP 129/69 | HR 64 | Temp 98.0°F | Resp 16 | Wt 191.1 lb

## 2013-08-20 DIAGNOSIS — Z23 Encounter for immunization: Secondary | ICD-10-CM

## 2013-08-20 DIAGNOSIS — M12811 Other specific arthropathies, not elsewhere classified, right shoulder: Secondary | ICD-10-CM

## 2013-08-20 DIAGNOSIS — Z0000 Encounter for general adult medical examination without abnormal findings: Secondary | ICD-10-CM

## 2013-08-20 DIAGNOSIS — Z87898 Personal history of other specified conditions: Secondary | ICD-10-CM

## 2013-08-20 DIAGNOSIS — M810 Age-related osteoporosis without current pathological fracture: Secondary | ICD-10-CM

## 2013-08-20 DIAGNOSIS — M75101 Unspecified rotator cuff tear or rupture of right shoulder, not specified as traumatic: Secondary | ICD-10-CM

## 2013-08-20 DIAGNOSIS — C9000 Multiple myeloma not having achieved remission: Secondary | ICD-10-CM | POA: Diagnosis not present

## 2013-08-20 DIAGNOSIS — C8589 Other specified types of non-Hodgkin lymphoma, extranodal and solid organ sites: Secondary | ICD-10-CM

## 2013-08-20 MED ORDER — OXYCODONE-ACETAMINOPHEN 5-325 MG PO TABS: 1.0000 | ORAL_TABLET | Freq: Four times a day (QID) | ORAL | Status: DC | PRN

## 2013-08-20 MED ORDER — INFLUENZA VAC SPLIT QUAD 0.5 ML IM SUSP
0.5000 mL | Freq: Once | INTRAMUSCULAR | Status: AC
  Administered 2013-08-20: 0.5 mL via INTRAMUSCULAR
  Filled 2013-08-20: qty 0.5

## 2013-08-20 NOTE — Progress Notes (Signed)
Jeremy Parrish presents today for injection per the provider's orders.  Flu vaccine administered administration without incident; see MAR for injection details.  Patient tolerated procedure well and without incident.  No questions or complaints noted at this time.

## 2013-08-20 NOTE — Patient Instructions (Signed)
[facility] Discharge Instructions  RECOMMENDATIONS MADE BY THE CONSULTANT AND ANY TEST RESULTS WILL BE SENT TO YOUR REFERRING PHYSICIAN.  EXAM FINDINGS BY THE PHYSICIAN TODAY AND SIGNS OR SYMPTOMS TO REPORT TO CLINIC OR PRIMARY PHYSICIAN: Exam and findings as discussed by T. Kefalas, PA-C.  INSTRUCTIONS/FOLLOW-UP: 1.  You received your flu vaccine today. 2.  Return every 8 weeks for labs as scheduled. 3.  [place] as scheduled. 4.  Return in 3 months for a follow-up office visit.  Thank you for choosing [facility] to provide your oncology and hematology care.  To afford each patient quality time with our providers, please arrive at least 15 minutes before your scheduled appointment time.  With your help, our goal is to use those 15 minutes to complete the necessary work-up to ensure our physicians have the information they need to help with your evaluation and healthcare recommendations.    Effective January 1st, 2014, we ask that you re-schedule your appointment with our physicians should you arrive 10 or more minutes late for your appointment.  We strive to give you quality time with our providers, and arriving late affects you and other patients whose appointments are after yours.    Again, thank you for choosing [facility].  Our hope is that these requests will decrease the amount of time that you wait before being seen by our physicians.       _____________________________________________________________  Should you have questions after your visit to [facility], please contact our office at [phone] between the hours of 8:30 a.m. and 5:00 p.m.  Voicemails left after 4:30 p.m. will not be returned until the following business day.  For prescription refill requests, have your pharmacy contact our office with your prescription refill request.

## 2013-08-20 NOTE — Progress Notes (Signed)
[name], MD [address] [place] [address]  MULTIPLE  MYELOMA  LYMPHOMA  Preventative health care - Plan: influenza vac split quadrivalent PF (FLUARIX) injection 0.5 mL  Rotator cuff tear arthropathy of right shoulder - Plan: oxyCODONE-acetaminophen (PERCOCET/ROXICET) 5-325 MG per tablet  CURRENT THERAPY: Revlimid 10 mg 7 days on and 7 days off  INTERVAL HISTORY: ARIUS Parrish 72 y.o. 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 Greggory Stallion at [facility] October 2011. Now on maintenance Revlimid which he is tolerating well.   Jeremy Parrish is doing well.  He reports that occasionally, particularly in the AM, he finds he has difficulty with his speech secondary to history of stroke.  Today, I thought he spoke well without any obvious changes outside of his baseline following his stroke.   He reports that he has plenty of stamina but finds he needs to take rests occasionally during difficult activities.  He asks about a flu shot which we will give today.  He was educated on solid hand hygiene practices.   I personally reviewed and went over laboratory results with the patient.  I personally reviewed and went over laboratory results with the patient.  Labs from 08/09/2013 shows a HGB and WBC WNL.  His platelet count is a little low at 107,000 which we will monitor.  His MM panel does not detect an M-Spike and his IgG is 586 with stable lambda and kappa light chain values.   He recently had arthroscopy surgery to his right shoulder and is undergoing physical therapy for this.  He plans on recovering and then proceeding with a similar procedure to his left shoulder.  Hematologically, he denies any complaints and ROS questioning is negative.  Past Medical History  Diagnosis Date  . Hypertension   . Heart disease   . Kidney stones     history  .  Lung mass   . Heart murmur   . 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 Coronary artery disease   . Depression   . Shortness of breath   . Peripheral arterial disease   . Bladder neck contracture   . Personal history of other diseases of circulatory system   . Aortic aneurysm of unspecified site without mention of rupture   . Arthritis   . Intestinovesical fistula   . Esophageal reflux   . Hyperlipidemia   . Anemia   . CHF (congestive heart failure)   . COPD (chronic obstructive pulmonary disease)   . Myocardial infarction   . Cerebral atherosclerosis     Carotid Doppler, 02/16/2013 - Bilateral Proximal ICAs,demonstrate mild plaque w/o evidence of significant diameter reduction, dissection, or any other vascular abnormality  . Complication of anesthesia   . PONV (postoperative nausea and vomiting)   . Stroke 2013    Speech.  . Cancer   . Prostate cancer 2000  . Multiple myeloma   . Hx of bladder cancer 10/07/2012  . Non Hodgkin's lymphoma     has LYMPHOMA; MULTIPLE  MYELOMA; ANXIETY; HYPERTENSION; MYOCARDIAL INFARCTION; CAD, dating to 1996 with multiple PCIs, Stents to RCA and LCX, Last cath 2007 patent stents,30% prox LAD lesion, EF 45%; ASTHMA, UNSPECIFIED; NEPHROLITHIASIS; ELEVATED PROSTATE SPECIFIC ANTIGEN; Nonspecific (abnormal) findings on radiological and other examination of body structure; ROTATOR CUFF REPAIR, RIGHT, HX OF;  Hypogammaglobulinemia; Cerebral embolism with cerebral infarction; H/O cardiac pacemaker, Medtronic REVO, MRI conditional device, placed 07/2011 for sympyomatic bradycardia; Hx of bladder cancer; Hyperlipidemia LDL goal < 100; Prediabetes; Expressive aphasia; Hemiplegia affecting right dominant side; Aneurysm of iliac artery; Shortness of breath; Rotator cuff tear arthropathy of right shoulder; Left rotator cuff tear arthropathy; Muscle weakness (generalized); Status post arthroscopy of shoulder;  and Pain in joint, shoulder region on his problem list.     is allergic to diphenhydramine hcl and morphine and related.  Mr. Redfield had no medications administered during this visit.  Past Surgical History  Procedure Laterality Date  . Prostate surgery    . Heart stents x 5  1999  . Portacath placement  07/26/2009  . Wrist surgery      right  . Left ear skin cancer removed    . Bone marrow transplant  2011  . Pacemaker insertion  2013  . Coronary angioplasty    . Insert / replace / remove pacemaker    . Tee without cardioversion  10/13/2012    Procedure: TRANSESOPHAGEAL ECHOCARDIOGRAM (TEE);  Surgeon: Thurmon Fair, MD;  Location: [facility] ENDOSCOPY;  Service: Cardiovascular;  Laterality: N/A;  pat/kay/echo notified  . Colonoscopy N/A 01/01/2013    Procedure: COLONOSCOPY;  Surgeon: Malissa Hippo, MD;  Location: [facility] ENDO SUITE;  Service: Endoscopy;  Laterality: N/A;  825-moved to 940 Ann notified pt  . Cardiac catheterization  06/24/2000    RCA stented with a 3.5x15 stent resulting in a reduction of 60% segmental hypodense area with moderate in-stent restenosis to 0% residual  . Rotator    . Rotator cuff repair Right   . Colon surgery      colon resection  . Bladder surgery    . Shoulder arthroscopy with subacromial decompression Right 07/21/2013    Procedure: RIGHT SHOULDER ARTHROSCOPY WITH SUBACROMIAL DECOMPRESSION AND DEBRIDEMENT & Injection of Left Shoulder;  Surgeon: Harvie Junior, MD;  Location: [facility] OR;  Service: Orthopedics;  Laterality: Right;    Denies any headaches, dizziness, double vision, fevers, chills, night sweats, nausea, vomiting, diarrhea, constipation, chest pain, heart palpitations, shortness of breath, blood in stool, black tarry stool, urinary pain, urinary burning, urinary frequency, hematuria.   PHYSICAL EXAMINATION  ECOG PERFORMANCE STATUS: 0 - Asymptomatic  Filed Vitals:   08/20/13 1005  BP: 129/69  Pulse: 64  Temp: 98 F (36.7 C)  Resp: 16     GENERAL:alert, no distress, well nourished, well developed, comfortable, cooperative and smiling  SKIN: skin color, texture, turgor are normal, no rashes or significant lesions  HEAD: Normocephalic, No masses, lesions, tenderness or abnormalities. EYES: normal, Conjunctiva are pink and non-injected  EARS: External ears normal  OROPHARYNX:mucous membranes are moist  NECK: supple, no adenopathy, thyroid normal size, non-tender, without nodularity, no stridor, non-tender, trachea midline  LYMPH: no palpable lymphadenopathy  BREAST:not examined  LUNGS: clear to auscultation and percussion  HEART: regular rate & rhythm, no murmurs, no gallops, S1 normal and S2 normal  ABDOMEN:abdomen soft, non-tender, obese and normal bowel sounds  BACK: Back symmetric, no curvature., No CVA tenderness  EXTREMITIES:less then 2 second capillary refill, no joint deformities, effusion, or inflammation, no edema, no skin discoloration, no clubbing, no cyanosis  NEURO: alert & oriented x 3 with dysarthric speech, no focal motor/sensory deficits, gait normal. Strength is 5+ B/L     LABORATORY DATA: CBC    Component Value Date/Time   WBC 4.7 08/09/2013 1104   RBC 4.46 08/09/2013 1104   HGB 13.0  08/09/2013 1104   HCT 39.5 08/09/2013 1104   PLT 107* 08/09/2013 1104   MCV 88.6 08/09/2013 1104   MCH 29.1 08/09/2013 1104   MCHC 32.9 08/09/2013 1104   RDW 15.1 08/09/2013 1104   LYMPHSABS 1.4 08/09/2013 1104   MONOABS 0.5 08/09/2013 1104   EOSABS 0.2 08/09/2013 1104   BASOSABS 0.0 08/09/2013 1104      Chemistry      Component Value Date/Time   NA 136 08/09/2013 1104   K 4.1 08/09/2013 1104   CL 103 08/09/2013 1104   CO2 24 08/09/2013 1104   BUN 12 08/09/2013 1104   CREATININE 0.84 08/09/2013 1104   CREATININE 0.77 04/02/2013 1025      Component Value Date/Time   CALCIUM 9.6 08/09/2013 1104   ALKPHOS 77 08/09/2013 1104   AST 23 08/09/2013 1104   ALT 19 08/09/2013 1104   BILITOT 0.4 08/09/2013 1104      Results for  KEMP, GOMES (MRN [id number]) as of 08/20/2013 09:38  Ref. Range 08/09/2013 11:04  Albumin ELP Latest Range: 55.8-66.1 % 62.2  Alpha-1-Globulin Latest Range: 2.9-4.9 % 5.3 (H)  Alpha-2-Globulin Latest Range: 7.1-11.8 % 11.7  Beta Globulin Latest Range: 4.7-7.2 % 6.9  Beta 2 Latest Range: 3.2-6.5 % 4.3  Gamma Globulin Latest Range: 11.1-18.8 % 9.6 (L)  M-SPIKE, % No range found NOT DETECTED  SPE Interp. No range found (NOTE)  Comment No range found (NOTE)  IgG (Immunoglobin G), Serum Latest Range: [phone] [phone] mg/dL 409 (L)  IgA Latest Range: 68-379 mg/dL 811  IgM, Serum Latest Range: 41-251 mg/dL 17 (L)  Kappa free light chain Latest Range: 0.33-1.94 mg/dL 9.14 (H)  Lamda free light chains Latest Range: 0.57-2.63 mg/dL 7.82  Kappa, lamda light chain ratio Latest Range: 0.26-1.65  0.84     RADIOGRAPHIC STUDIES:  07/21/2013  *RADIOLOGY REPORT*  Clinical Data: Preoperative evaluation for right shoulder surgery,  hypertension  CHEST - 2 VIEW  Comparison: 04/02/2013  Findings: Cardiac shadow is stable. A pacing device is again seen.  A right-sided central venous port is again seen as well. The lungs  are well-aerated. Mild scarring is seen in the left lung base. No  acute bony abnormality is noted.  IMPRESSION:  No acute abnormalities seen. No significant change from prior  exam.  Original Report Authenticated By: Alcide Clever, M.D.    ASSESSMENT:  1. IgG lambda multiple myeloma status post 4 cycles of dexamethasone and Velcade followed by bone marrow transplantation by Dr. Greggory Stallion at [facility] October 2011. Now on maintenance Revlimid which he is tolerating well.  2. Non-Hodgkin's lymphoma, stage IVb, low-grade B-cell type, symptomatic at presentation in July 2002 to 6 cycles of R. CHOP with a complete response and PET scan in June 2012 showed no evidence for recurrent disease  3. History of probable bronchopneumonia with productive cough discolored yellow phlegm, low-grade fever today,  coughing  4. Hypogammaglobulinemia  5. 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 CAD status post stent placement x5 in the past  7. Colovesical fistula repair years ago  8. Skin cancer on the right ear status post surgery by Dr. Park Liter  9. Recent CVA of left middle cerebral artery, causing expressive aphasia and right hemiparesis. Both are significantly improved.  10. Osteoporosis on therapy with zoledronic acid calcium and vitamin D  Patient Active Problem List   Diagnosis Date Noted  . Muscle weakness (generalized) 08/04/2013  . Status post arthroscopy of shoulder 08/04/2013  . Pain in  joint, shoulder region 08/04/2013  . Rotator cuff tear arthropathy of right shoulder 07/21/2013  . Left rotator cuff tear arthropathy 07/21/2013  . Shortness of breath 07/07/2013  . 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 Hyperlipidemia LDL goal < 100 10/08/2012  . Prediabetes 10/08/2012  . Expressive aphasia 10/08/2012  . Hemiplegia affecting right dominant side 10/08/2012  . 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 Hx of bladder cancer 10/07/2012  . Cerebral embolism with cerebral infarction 10/06/2012  . 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 ASTHMA, UNSPECIFIED 03/22/2010  . Nonspecific (abnormal) findings on radiological and other examination of body structure 03/22/2010  . LYMPHOMA 03/21/2010  . MULTIPLE  MYELOMA 03/21/2010  . ANXIETY 03/21/2010  . HYPERTENSION 03/21/2010  . MYOCARDIAL INFARCTION 03/21/2010  . CAD, dating to 1996 with multiple PCIs, Stents to RCA and LCX, Last cath 2007 patent stents,30% prox LAD lesion, EF 45% 03/21/2010  . NEPHROLITHIASIS 03/21/2010  . ELEVATED PROSTATE SPECIFIC ANTIGEN 03/21/2010  . ROTATOR CUFF REPAIR, RIGHT, HX OF 03/21/2010     PLAN:  1. I personally reviewed and went over laboratory results with the patient. 2. I personally  reviewed and went over radiographic studies with the patient. 3. Labs every 8 weeks: CBC diff, CMET, MM Panel, LDH 4. Continue Revlimid maintenance 5. Zometa every 4 weeks, last given on 08/11/2013. 6. Influenza vaccine today 7. Rx refill for Oxycodone 8. Return in 3 months for follow-up    THERAPY PLAN:  We will continue with Zometa supportive care and perform lab work to follow his MM along.  He is to continue with his Revlimid maintenance therapy.  All questions were answered. The patient knows to call the clinic with any problems, questions or concerns. We can certainly see the patient much sooner if necessary.  Patient and plan discussed with Dr. Alla German and he is in agreement with the aforementioned.   Kazmir Oki

## 2013-08-23 ENCOUNTER — Ambulatory Visit (HOSPITAL_COMMUNITY)
Admission: RE | Admit: 2013-08-23 | Discharge: 2013-08-23 | Disposition: A | Discharge status: Home / Self Care | Payer: Medicare Other | Source: Ambulatory Visit | Attending: Family Medicine | Admitting: Family Medicine

## 2013-08-23 DIAGNOSIS — M6281 Muscle weakness (generalized): Secondary | ICD-10-CM | POA: Diagnosis not present

## 2013-08-23 DIAGNOSIS — IMO0001 Reserved for inherently not codable concepts without codable children: Secondary | ICD-10-CM | POA: Diagnosis not present

## 2013-08-23 DIAGNOSIS — M25519 Pain in unspecified shoulder: Secondary | ICD-10-CM | POA: Diagnosis not present

## 2013-08-23 DIAGNOSIS — I10 Essential (primary) hypertension: Secondary | ICD-10-CM | POA: Diagnosis not present

## 2013-08-23 NOTE — Progress Notes (Signed)
Occupational Therapy Treatment Patient Details  Name: Jeremy Parrish MRN: [id number] Date of Birth: [birth year]  Today's Date: 08/23/2013 Time: 7829-5621 OT Time Calculation (min): 44 min Manual Therapy [phone] 11' Therapeutic exercises [id number] 74' Visit#: 7 of 12  Re-eval: 09/01/13    Authorization: Medicare  Authorization Time Period: before 10th visit  Authorization Visit#: 7 of 10  Subjective S:  It hurts on the side of my arm.  Pain Assessment Currently in Pain?: Yes Pain Score: 2  Pain Location: Shoulder Pain Orientation: Right Pain Type: Acute pain  Precautions/Restrictions    Progress as tolerated Exercise/Treatments Supine Protraction: PROM;Strengthening;10 reps Protraction Weight (lbs): 1 Horizontal ABduction: PROM;Strengthening;10 reps Horizontal ABduction Weight (lbs): 1 External Rotation: PROM;Strengthening;10 reps External Rotation Weight (lbs): 1 Internal Rotation: PROM;Strengthening;10 reps Internal Rotation Weight (lbs): 1 Flexion: PROM;Strengthening;10 reps Shoulder Flexion Weight (lbs): 1 ABduction: PROM;Strengthening;10 reps Shoulder ABduction Weight (lbs): 1 Seated Protraction: AROM;10 reps Horizontal ABduction: AROM;10 reps External Rotation: AROM;10 reps Internal Rotation: AROM;10 reps Flexion: AROM;10 reps Abduction: AROM;10 reps Standing External Rotation: Theraband;15 reps Theraband Level (Shoulder External Rotation): Level 2 (Red) Internal Rotation: 15 reps;Theraband Theraband Level (Shoulder Internal Rotation): Level 2 (Red) Extension: 15 reps;Theraband Theraband Level (Shoulder Extension): Level 2 (Red) Row: 15 reps;Theraband Theraband Level (Shoulder Row): Level 2 (Red) Retraction: 15 reps;Theraband Theraband Level (Shoulder Retraction): Level 2 (Red) ROM / Strengthening / Isometric Strengthening UBE (Upper Arm Bike): 3' forward and 3' reverse at 1/5 resistance Wall Wash: 3' "W" Arms: 5X and arm fatigued X to V Arms:  10X Proximal Shoulder Strengthening, Supine: 10X with 1# resistance  Proximal Shoulder Strengthening, Seated: 10X       Manual Therapy Manual Therapy: Myofascial release Myofascial Release: MFR to right upper arm, trapezius, and scapularis region to decrease fascial restrictions and increase joint mobility in a pain free zone  Occupational Therapy Assessment and Plan OT Assessment and Plan Clinical Impression Statement: A:  Added 1# resistance to supine exercises with good form.  Required min tactile cues with flexion for support on initial few repetitions.  OT Plan: P:  Complete all strengthening exercises independently.  Add 1# to wall wash.    Goals Short Term Goals Short Term Goal 1: Patient will be educated on HEP. Short Term Goal 1 Progress: Progressing toward goal Short Term Goal 2: Patient will have 4-/5 strength in his RUE for increased ability to participate in daily activities.  Short Term Goal 2 Progress: Progressing toward goal Short Term Goal 3: Patient will decrease pain in his right arm to 3/10 when participting in daily activities.  Short Term Goal 3 Progress: Progressing toward goal Short Term Goal 4: Patient will have min-mod fascial restrictions in his left arm.  Short Term Goal 4 Progress: Progressing toward goal Long Term Goals Long Term Goal 1: Patient will return to highest level of independence with all leisure and daily activities.  Long Term Goal 1 Progress: Progressing toward goal Long Term Goal 2: Patient will have 4+/5 strength in his RUE for increased ability to participate in daily activities.  Long Term Goal 2 Progress: Progressing toward goal Long Term Goal 3: Patient will decrease pain in his right arm to 1/10 when participting in daily activities. Long Term Goal 3 Progress: Progressing toward goal Long Term Goal 4: Patient will have trace-min fascial restrictions in his right arm.  Long Term Goal 4 Progress: Progressing toward goal Long Term Goal  5: Patient will increase AROM to WNL in his righ shoulder for increased independence with  reaching into overhead cabinets.  Long Term Goal 5 Progress: Progressing toward goal  Problem List Patient Active Problem List   Diagnosis Date Noted  . Muscle weakness (generalized) 08/04/2013  . Status post arthroscopy of shoulder 08/04/2013  . Pain in joint, shoulder region 08/04/2013  . Rotator cuff tear arthropathy of right shoulder 07/21/2013  . Left rotator cuff tear arthropathy 07/21/2013  . Shortness of breath 07/07/2013  . 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 Hyperlipidemia LDL goal < 100 10/08/2012  . Prediabetes 10/08/2012  . Expressive aphasia 10/08/2012  . Hemiplegia affecting right dominant side 10/08/2012  . 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 Hx of bladder cancer 10/07/2012  . Cerebral embolism with cerebral infarction 10/06/2012  . 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 ASTHMA, UNSPECIFIED 03/22/2010  . Nonspecific (abnormal) findings on radiological and other examination of body structure 03/22/2010  . LYMPHOMA 03/21/2010  . MULTIPLE  MYELOMA 03/21/2010  . ANXIETY 03/21/2010  . HYPERTENSION 03/21/2010  . MYOCARDIAL INFARCTION 03/21/2010  . CAD, dating to 1996 with multiple PCIs, Stents to RCA and LCX, Last cath 2007 patent stents,30% prox LAD lesion, EF 45% 03/21/2010  . NEPHROLITHIASIS 03/21/2010  . ELEVATED PROSTATE SPECIFIC ANTIGEN 03/21/2010  . ROTATOR CUFF REPAIR, RIGHT, HX OF 03/21/2010    End of Session Activity Tolerance: Patient tolerated treatment well General Behavior During Therapy: [facility] for tasks assessed/performed  GO    Shirlean Mylar, OTR/L  08/23/2013, 10:18 AM

## 2013-08-25 ENCOUNTER — Encounter: Payer: Self-pay | Admitting: Cardiovascular Disease

## 2013-08-26 ENCOUNTER — Encounter: Payer: Self-pay | Admitting: Cardiovascular Disease

## 2013-08-26 ENCOUNTER — Ambulatory Visit (HOSPITAL_COMMUNITY)
Admission: RE | Admit: 2013-08-26 | Discharge: 2013-08-26 | Disposition: A | Discharge status: Home / Self Care | Payer: Medicare Other | Source: Ambulatory Visit | Attending: Family Medicine | Admitting: Family Medicine

## 2013-08-26 DIAGNOSIS — M6281 Muscle weakness (generalized): Secondary | ICD-10-CM | POA: Diagnosis not present

## 2013-08-26 DIAGNOSIS — IMO0001 Reserved for inherently not codable concepts without codable children: Secondary | ICD-10-CM | POA: Diagnosis not present

## 2013-08-26 DIAGNOSIS — M25519 Pain in unspecified shoulder: Secondary | ICD-10-CM | POA: Diagnosis not present

## 2013-08-26 DIAGNOSIS — I10 Essential (primary) hypertension: Secondary | ICD-10-CM | POA: Diagnosis not present

## 2013-09-01 ENCOUNTER — Ambulatory Visit (HOSPITAL_COMMUNITY): Payer: Medicare Other | Admitting: Occupational Therapy

## 2013-09-01 NOTE — Progress Notes (Signed)
Occupational Therapy Treatment Patient Details  Name: Jeremy Parrish MRN: [id number] Date of Birth: 04/26/1941  Today's Date: 08/26/2013 Time: 4098-1191 OT Time Calculation (min): 45 min Manual 0935-0950 (15') TherExercise 0950-1020 (30')  Visit#: 8 of 12  Re-eval: 09/01/13    Authorization: Medicare  Authorization Time Period: before 10th visit  Authorization Visit#: 8 of 10  Subjective Symptoms/Limitations Symptoms: S: Both my arms hurt me today... I have been exercising them Pain Assessment Currently in Pain?: Yes Pain Score: 5  Pain Location: Arm Pain Orientation: Right;Left Pain Type: Acute pain Pain Frequency: Intermittent Multiple Pain Sites: No       Exercise/Treatments Supine Protraction: PROM;12 reps;AROM;Strengthening;10 reps Protraction Weight (lbs): 1 Horizontal ABduction: PROM;12 reps;AROM;Strengthening;10 reps Horizontal ABduction Weight (lbs): 1 External Rotation: PROM;12 reps;AROM;Strengthening;10 reps External Rotation Weight (lbs): 1 Internal Rotation: PROM;12 reps;AROM;Strengthening;10 reps Internal Rotation Weight (lbs): 1 Flexion: PROM;12 reps;AROM;Strengthening;10 reps Shoulder Flexion Weight (lbs): 1 ABduction: PROM;12 reps;AROM;Strengthening;10 reps Shoulder ABduction Weight (lbs): 1 Seated Elevation: AROM;15 reps Extension: AROM;15 reps Row: AROM;15 reps Protraction: AROM;10 reps Horizontal ABduction: AROM;10 reps External Rotation: AROM;10 reps Internal Rotation: AROM;10 reps Flexion: AROM;10 reps Abduction: AROM;10 reps Prone    Sidelying   Standing External Rotation: Theraband;15 reps Theraband Level (Shoulder External Rotation): Level 2 (Red) Internal Rotation: Theraband;15 reps Theraband Level (Shoulder Internal Rotation): Level 2 (Red) Extension: Theraband;15 reps Theraband Level (Shoulder Extension): Level 2 (Red) Row: Theraband;15 reps Theraband Level (Shoulder Row): Level 2 (Red) Retraction: Theraband;15  reps Theraband Level (Shoulder Retraction): Level 2 (Red)    ROM / Strengthening / Isometric Strengthening UBE (Upper Arm Bike): 3' forward and 3' reverse at 2 resistance Wall Wash: 3' (1# wrist weights)        Manual Therapy Manual Therapy: Myofascial release Myofascial Release: MFR to right upper arm, trapezius, and scapularis region to decrease fascial restrictions and increase joint mobility in a pain free zone  Occupational Therapy Assessment and Plan OT Assessment and Plan Clinical Impression Statement: A: increased resistance on UBE this date with good tolerance.  Good form with weights this date with verbal cues only  OT Plan: P: Complete strengthening exercises independently with decreased c/o pain .   Goals Short Term Goals Short Term Goal 1: Patient will be educated on HEP. Short Term Goal 1 Progress: Progressing toward goal Short Term Goal 2: Patient will have 4-/5 strength in his RUE for increased ability to participate in daily activities.  Short Term Goal 2 Progress: Progressing toward goal Short Term Goal 3: Patient will decrease pain in his right arm to 3/10 when participting in daily activities.  Short Term Goal 3 Progress: Progressing toward goal Short Term Goal 4: Patient will have min-mod fascial restrictions in his left arm.  Short Term Goal 4 Progress: Progressing toward goal Long Term Goals Long Term Goal 1: Patient will return to highest level of independence with all leisure and daily activities.  Long Term Goal 1 Progress: Progressing toward goal Long Term Goal 2: Patient will have 4+/5 strength in his RUE for increased ability to participate in daily activities.  Long Term Goal 2 Progress: Progressing toward goal Long Term Goal 3: Patient will decrease pain in his right arm to 1/10 when participting in daily activities. Long Term Goal 3 Progress: Progressing toward goal Long Term Goal 4: Patient will have trace-min fascial restrictions in his right  arm.  Long Term Goal 4 Progress: Progressing toward goal Long Term Goal 5: Patient will increase AROM to WNL in his righ shoulder  for increased independence with reaching into overhead cabinets.  Long Term Goal 5 Progress: Progressing toward goal  Problem List Patient Active Problem List   Diagnosis Date Noted  . Muscle weakness (generalized) 08/04/2013  . Status post arthroscopy of shoulder 08/04/2013  . Pain in joint, shoulder region 08/04/2013  . Rotator cuff tear arthropathy of right shoulder 07/21/2013  . Left rotator cuff tear arthropathy 07/21/2013  . Shortness of breath 07/07/2013  . 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 Hyperlipidemia LDL goal < 100 10/08/2012  . Prediabetes 10/08/2012  . Expressive aphasia 10/08/2012  . Hemiplegia affecting right dominant side 10/08/2012  . 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 Hx of bladder cancer 10/07/2012  . Cerebral embolism with cerebral infarction 10/06/2012  . 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 ASTHMA, UNSPECIFIED 03/22/2010  . Nonspecific (abnormal) findings on radiological and other examination of body structure 03/22/2010  . LYMPHOMA 03/21/2010  . MULTIPLE  MYELOMA 03/21/2010  . ANXIETY 03/21/2010  . HYPERTENSION 03/21/2010  . MYOCARDIAL INFARCTION 03/21/2010  . CAD, dating to 1996 with multiple PCIs, Stents to RCA and LCX, Last cath 2007 patent stents,30% prox LAD lesion, EF 45% 03/21/2010  . NEPHROLITHIASIS 03/21/2010  . ELEVATED PROSTATE SPECIFIC ANTIGEN 03/21/2010  . ROTATOR CUFF REPAIR, RIGHT, HX OF 03/21/2010    End of Session Activity Tolerance: Patient tolerated treatment well General Behavior During Therapy: [facility] for tasks assessed/performed  GO    Velora Mediate, OTR/L  08/26/2013, 9:47 PM

## 2013-09-03 ENCOUNTER — Ambulatory Visit (HOSPITAL_COMMUNITY)
Admission: RE | Admit: 2013-09-03 | Discharge: 2013-09-03 | Disposition: A | Discharge status: Home / Self Care | Payer: Medicare Other | Source: Ambulatory Visit | Attending: Family Medicine | Admitting: Family Medicine

## 2013-09-03 DIAGNOSIS — M25519 Pain in unspecified shoulder: Secondary | ICD-10-CM | POA: Diagnosis not present

## 2013-09-03 DIAGNOSIS — IMO0001 Reserved for inherently not codable concepts without codable children: Secondary | ICD-10-CM | POA: Diagnosis not present

## 2013-09-03 DIAGNOSIS — I10 Essential (primary) hypertension: Secondary | ICD-10-CM | POA: Diagnosis not present

## 2013-09-03 DIAGNOSIS — M6281 Muscle weakness (generalized): Secondary | ICD-10-CM | POA: Diagnosis not present

## 2013-09-06 ENCOUNTER — Encounter (HOSPITAL_BASED_OUTPATIENT_CLINIC_OR_DEPARTMENT_OTHER): Payer: Medicare Other

## 2013-09-06 DIAGNOSIS — C8589 Other specified types of non-Hodgkin lymphoma, extranodal and solid organ sites: Secondary | ICD-10-CM

## 2013-09-06 DIAGNOSIS — C9000 Multiple myeloma not having achieved remission: Secondary | ICD-10-CM | POA: Diagnosis not present

## 2013-09-06 DIAGNOSIS — D801 Nonfamilial hypogammaglobulinemia: Secondary | ICD-10-CM | POA: Diagnosis not present

## 2013-09-06 DIAGNOSIS — Z0000 Encounter for general adult medical examination without abnormal findings: Secondary | ICD-10-CM | POA: Diagnosis not present

## 2013-09-06 DIAGNOSIS — M12519 Traumatic arthropathy, unspecified shoulder: Secondary | ICD-10-CM | POA: Diagnosis not present

## 2013-09-06 DIAGNOSIS — J069 Acute upper respiratory infection, unspecified: Secondary | ICD-10-CM | POA: Diagnosis not present

## 2013-09-06 LAB — CBC WITH DIFFERENTIAL/PLATELET
Basophils Absolute: 0.1 10*3/uL (ref 0.0–0.1)
Eosinophils Relative: 3 % (ref 0–5)
HCT: 38.8 % — ABNORMAL LOW (ref 39.0–52.0)
Lymphocytes Relative: 36 % (ref 12–46)
Lymphs Abs: 1.4 10*3/uL (ref 0.7–4.0)
MCH: 29.8 pg (ref 26.0–34.0)
MCV: 91.1 fL (ref 78.0–100.0)
Monocytes Absolute: 0.5 10*3/uL (ref 0.1–1.0)
RDW: 16.5 % — ABNORMAL HIGH (ref 11.5–15.5)
WBC: 3.8 10*3/uL — ABNORMAL LOW (ref 4.0–10.5)

## 2013-09-06 LAB — COMPREHENSIVE METABOLIC PANEL
CO2: 27 mEq/L (ref 19–32)
Calcium: 9.4 mg/dL (ref 8.4–10.5)
Creatinine, Ser: 0.97 mg/dL (ref 0.50–1.35)
GFR calc Af Amer: >90 mL/min (ref >90)
GFR calc non Af Amer: 82 mL/min — ABNORMAL LOW (ref >90)
Glucose, Bld: 90 mg/dL (ref 70–99)

## 2013-09-06 NOTE — Progress Notes (Signed)
Labs drawn today for cbc/diff,cmp,ldh,kllc,mm panel

## 2013-09-07 ENCOUNTER — Ambulatory Visit (HOSPITAL_COMMUNITY): Payer: Medicare Other | Admitting: Occupational Therapy

## 2013-09-08 ENCOUNTER — Encounter (HOSPITAL_BASED_OUTPATIENT_CLINIC_OR_DEPARTMENT_OTHER): Payer: Medicare Other | Admitting: Oncology

## 2013-09-08 ENCOUNTER — Encounter (HOSPITAL_BASED_OUTPATIENT_CLINIC_OR_DEPARTMENT_OTHER): Payer: Medicare Other

## 2013-09-08 VITALS — BP 134/78 | HR 68 | Temp 98.0°F | Resp 18

## 2013-09-08 DIAGNOSIS — C8589 Other specified types of non-Hodgkin lymphoma, extranodal and solid organ sites: Secondary | ICD-10-CM

## 2013-09-08 DIAGNOSIS — R05 Cough: Secondary | ICD-10-CM

## 2013-09-08 DIAGNOSIS — J069 Acute upper respiratory infection, unspecified: Secondary | ICD-10-CM | POA: Diagnosis not present

## 2013-09-08 DIAGNOSIS — D801 Nonfamilial hypogammaglobulinemia: Secondary | ICD-10-CM

## 2013-09-08 DIAGNOSIS — C9000 Multiple myeloma not having achieved remission: Secondary | ICD-10-CM | POA: Diagnosis not present

## 2013-09-08 DIAGNOSIS — R058 Other specified cough: Secondary | ICD-10-CM

## 2013-09-08 LAB — MULTIPLE MYELOMA PANEL, SERUM
Beta 2: 5 % (ref 3.2–6.5)
Gamma Globulin: 10 % — ABNORMAL LOW (ref 11.1–18.8)

## 2013-09-08 LAB — KAPPA/LAMBDA LIGHT CHAINS
Kappa free light chain: 2.05 mg/dL — ABNORMAL HIGH (ref 0.33–1.94)
Kappa, lambda light chain ratio: 0.9 (ref 0.26–1.65)
Lambda free light chains: 2.27 mg/dL (ref 0.57–2.63)

## 2013-09-08 MED ORDER — SODIUM CHLORIDE 0.9 % IJ SOLN
10.0000 mL | INTRAMUSCULAR | Status: DC | PRN
  Administered 2013-09-08: 10 mL

## 2013-09-08 MED ORDER — AZITHROMYCIN 250 MG PO TABS: ORAL_TABLET | ORAL | Status: DC

## 2013-09-08 MED ORDER — HEPARIN SOD (PORK) LOCK FLUSH 100 UNIT/ML IV SOLN
500.0000 [IU] | Freq: Once | INTRAVENOUS | Status: AC | PRN
  Administered 2013-09-08: 500 [IU]
  Filled 2013-09-08: qty 5

## 2013-09-08 MED ORDER — ZOLEDRONIC ACID 4 MG/5ML IV CONC
4.0000 mg | Freq: Once | INTRAVENOUS | Status: AC
  Administered 2013-09-08: 4 mg via INTRAVENOUS
  Filled 2013-09-08: qty 5

## 2013-09-08 MED ORDER — SODIUM CHLORIDE 0.9 % IV SOLN
Freq: Once | INTRAVENOUS | Status: AC
  Administered 2013-09-08: 09:00:00 via INTRAVENOUS

## 2013-09-08 NOTE — Progress Notes (Signed)
Jeremy Parrish is here for Zometa infusion.  He asked nurse if I would see him today.  He reports a cough that is productive of "yucky" sputum.  He reports that it is green/yellow.  He denies any fevers or chills, but does feel like he is "coming down with something."    I will give him a Z-Pak to hopefully prevent a situation requiring IVIG.  He will contact us with any issues including fevers or chills.   KEFALAS,THOMAS

## 2013-09-08 NOTE — Progress Notes (Signed)
Tolerated well

## 2013-09-09 ENCOUNTER — Encounter: Payer: Self-pay | Admitting: Family Medicine

## 2013-09-09 ENCOUNTER — Ambulatory Visit (INDEPENDENT_AMBULATORY_CARE_PROVIDER_SITE_OTHER): Payer: Medicare Other | Admitting: Family Medicine

## 2013-09-09 VITALS — BP 118/78 | Temp 98.7°F | Ht 68.0 in | Wt 194.2 lb

## 2013-09-09 DIAGNOSIS — J209 Acute bronchitis, unspecified: Secondary | ICD-10-CM

## 2013-09-09 DIAGNOSIS — R05 Cough: Secondary | ICD-10-CM

## 2013-09-09 DIAGNOSIS — R509 Fever, unspecified: Secondary | ICD-10-CM

## 2013-09-09 MED ORDER — CEFTRIAXONE SODIUM 1 G IJ SOLR
1.0000 g | Freq: Once | INTRAMUSCULAR | Status: AC
  Administered 2013-09-09: 1 g via INTRAMUSCULAR

## 2013-09-09 NOTE — Progress Notes (Signed)
  Subjective:    Patient ID: Jeremy Parrish, male    DOB: [birth year], 72 y.o.   MRN: [id number]  Wheezing  This is a new problem. The current episode started 1 to 4 weeks ago. The problem occurs constantly. The problem has been gradually worsening. Associated symptoms include coughing. Nothing aggravates the symptoms. He has tried nothing for the symptoms. The treatment provided no relief.    Patient with some congestion coughing a little bit of wheezing he states fever yesterday but none today he is a cancer patient going through treatments he had recent blood work couple days ago. States he is eating drinking keep things down. Does not smoke. Has family support.  Review of Systems  Respiratory: Positive for cough and wheezing.    denies chest pressure tightness denies being short of breath     Objective:   Physical Exam Minimal wheeze noted coarse cough noted heart regular eardrums normal abdomen soft       Assessment & Plan:  Bronchitis possible early pneumonia Rocephin shot today followup tomorrow for recheck started on antibiotics that time hold off on x-rays lab work currently. He has albuterol at home he can use.

## 2013-09-09 NOTE — Evaluation (Signed)
Occupational Therapy Re-Evaluation  Patient Details  Name: PINCHOS TOPEL MRN: [id number] Date of Birth: [birth year]  Today's Date: 09/03/2013 Time: 1305-1350 OT Time Calculation (min): 45 min Manual 1305-1317 (12') Reassessment 1317-1332 (15') TherExercises 1332-1350 (18')   Visit#: 9 of 12  Re-eval: 09/29/13  Assessment Diagnosis: Right shoulder arthroscopy  Authorization: Medicare  Authorization Time Period: before 19th visit  Authorization Visit#: 9 of 10   Past Medical History:  Past Medical History  Diagnosis Date  . Hypertension   . Heart disease   . Kidney stones     history  . Lung mass   . Heart murmur   . 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 Coronary artery disease   . Depression   . Shortness of breath   . Peripheral arterial disease   . Bladder neck contracture   . Personal history of other diseases of circulatory system   . Aortic aneurysm of unspecified site without mention of rupture   . Arthritis   . Intestinovesical fistula   . Esophageal reflux   . Hyperlipidemia   . Anemia   . CHF (congestive heart failure)   . COPD (chronic obstructive pulmonary disease)   . Myocardial infarction   . Cerebral atherosclerosis     Carotid Doppler, 02/16/2013 - Bilateral Proximal ICAs,demonstrate mild plaque w/o evidence of significant diameter reduction, dissection, or any other vascular abnormality  . Complication of anesthesia   . PONV (postoperative nausea and vomiting)   . Stroke 2013    Speech.  . Cancer   . Prostate cancer 2000  . Multiple myeloma   . Hx of bladder cancer 10/07/2012  . Non Hodgkin's lymphoma    Past Surgical History:  Past Surgical History  Procedure Laterality Date  . Prostate surgery    . Heart stents x 5  1999  . Portacath placement  07/26/2009  . Wrist surgery      right  . Left ear skin cancer removed    . Bone marrow transplant  2011  . Pacemaker  insertion  2013  . Coronary angioplasty    . Insert / replace / remove pacemaker    . Tee without cardioversion  10/13/2012    Procedure: TRANSESOPHAGEAL ECHOCARDIOGRAM (TEE);  Surgeon: Thurmon Fair, MD;  Location: [facility] ENDOSCOPY;  Service: Cardiovascular;  Laterality: N/A;  pat/kay/echo notified  . Colonoscopy N/A 01/01/2013    Procedure: COLONOSCOPY;  Surgeon: Malissa Hippo, MD;  Location: [facility] ENDO SUITE;  Service: Endoscopy;  Laterality: N/A;  825-moved to 940 Ann notified pt  . Cardiac catheterization  06/24/2000    RCA stented with a 3.5x15 stent resulting in a reduction of 60% segmental hypodense area with moderate in-stent restenosis to 0% residual  . Rotator    . Rotator cuff repair Right   . Colon surgery      colon resection  . Bladder surgery    . Shoulder arthroscopy with subacromial decompression Right 07/21/2013    Procedure: RIGHT SHOULDER ARTHROSCOPY WITH SUBACROMIAL DECOMPRESSION AND DEBRIDEMENT & Injection of Left Shoulder;  Surgeon: Harvie Junior, MD;  Location: [facility] OR;  Service: Orthopedics;  Laterality: Right;    Subjective Symptoms/Limitations Symptoms: S: I washed my camper yesterday and I think I have done too much....  Pain Assessment Currently in Pain?: Yes Pain Score: 6  Pain Location: Arm Pain Orientation: Right;Left Pain Type: Acute pain Multiple Pain Sites: No  Precautions/Restrictions  Precautions Precautions: Shoulder Restrictions Weight Bearing  Restrictions: No  Assessment ADL/Vision/Perception ADL ADL Comments: Reports increasing ease to reach into cabinets for lighter weight items   Additional Assessments RUE AROM (degrees) Right Shoulder Flexion: 148 Degrees Right Shoulder ABduction: 152 Degrees Right Shoulder Internal Rotation: 70 Degrees Right Shoulder External Rotation: 82 Degrees RUE Strength Right Shoulder Flexion:  (4-/5) Right Shoulder ABduction:  (4-/5) Right Shoulder Internal Rotation: 4/5 Right Shoulder External Rotation:  3+/5 Palpation Palpation: Min fascial restrictions in right upper arm, trapezius, and scapularis region.      Exercise/Treatments Supine Horizontal ABduction: PROM;12 reps;AROM;Strengthening;10 reps Horizontal ABduction Weight (lbs): 2 External Rotation: PROM;12 reps;AROM;Strengthening;10 reps External Rotation Weight (lbs): 2 Internal Rotation: PROM;12 reps;AROM;Strengthening;10 reps Internal Rotation Weight (lbs): 2 Flexion: PROM;12 reps;AROM;Strengthening;10 reps Shoulder Flexion Weight (lbs): 2 ABduction: PROM;12 reps;AROM;Strengthening;10 reps Shoulder ABduction Weight (lbs): 2 Seated Extension: AROM;15 reps External Rotation: AROM;10 reps Internal Rotation: AROM;10 reps Flexion: AROM;10 reps Abduction: AROM;10 reps       Manual Therapy Manual Therapy: Myofascial release Myofascial Release: MFR to right upper arm, trapezius, and scapularis region to decrease fascial restrictions and increase joint mobility in a pain free zone  Occupational Therapy Assessment and Plan OT Assessment and Plan Clinical Impression Statement: A: Reassessment completed this date. Patient continue to progress towards established goals however he attempted to wash his campter down and is now with increased tightness and complanit of soreness/pain. Patient with excellent follow through with HEP and reports involuntary incorporation of UE in daily tasks. Patient has met 3/4 STG only missing pain STG and cont to progress towards LTGs . OT Plan: P: Complete strengthening exercises independently with decreased c/o pain . Continue skilled OT services 2x/week for 4 weeks to maximize functional independence/potential.    Goals Short Term Goals Short Term Goal 1: Patient will be educated on HEP. Short Term Goal 1 Progress: Met Short Term Goal 2: Patient will have 4-/5 strength in his RUE for increased ability to participate in daily activities.  Short Term Goal 2 Progress: Met Short Term Goal 3: Patient  will decrease pain in his right arm to 3/10 when participting in daily activities.  Short Term Goal 3 Progress: Progressing toward goal Short Term Goal 4: Patient will have min-mod fascial restrictions in his left arm.  Short Term Goal 4 Progress: Met Long Term Goals Long Term Goal 1: Patient will return to highest level of independence with all leisure and daily activities.  Long Term Goal 1 Progress: Progressing toward goal Long Term Goal 2: Patient will have 4+/5 strength in his RUE for increased ability to participate in daily activities.  Long Term Goal 2 Progress: Progressing toward goal Long Term Goal 3: Patient will decrease pain in his right arm to 1/10 when participting in daily activities. Long Term Goal 3 Progress: Progressing toward goal Long Term Goal 4: Patient will have trace-min fascial restrictions in his right arm.  Long Term Goal 4 Progress: Progressing toward goal Long Term Goal 5: Patient will increase AROM to WNL in his righ shoulder for increased independence with reaching into overhead cabinets.  Long Term Goal 5 Progress: Progressing toward goal  Problem List Patient Active Problem List   Diagnosis Date Noted  . Muscle weakness (generalized) 08/04/2013  . Status post arthroscopy of shoulder 08/04/2013  . Pain in joint, shoulder region 08/04/2013  . Rotator cuff tear arthropathy of right shoulder 07/21/2013  . Left rotator cuff tear arthropathy 07/21/2013  . Shortness of breath 07/07/2013  . 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 Hyperlipidemia  LDL goal < 100 10/08/2012  . Prediabetes 10/08/2012  . Expressive aphasia 10/08/2012  . Hemiplegia affecting right dominant side 10/08/2012  . 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 Hx of bladder cancer 10/07/2012  . Cerebral embolism with cerebral infarction 10/06/2012  . 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 ASTHMA, UNSPECIFIED 03/22/2010   . Nonspecific (abnormal) findings on radiological and other examination of body structure 03/22/2010  . LYMPHOMA 03/21/2010  . MULTIPLE  MYELOMA 03/21/2010  . ANXIETY 03/21/2010  . HYPERTENSION 03/21/2010  . MYOCARDIAL INFARCTION 03/21/2010  . CAD, dating to 1996 with multiple PCIs, Stents to RCA and LCX, Last cath 2007 patent stents,30% prox LAD lesion, EF 45% 03/21/2010  . NEPHROLITHIASIS 03/21/2010  . ELEVATED PROSTATE SPECIFIC ANTIGEN 03/21/2010  . ROTATOR CUFF REPAIR, RIGHT, HX OF 03/21/2010    End of Session Activity Tolerance: Patient tolerated treatment well General Behavior During Therapy: [facility] for tasks assessed/performed  GO Functional Limitation: Carrying, moving and handling objects Carrying, Moving and Handling Objects Current Status ([id number]): At least 1 percent but less than 20 percent impaired, limited or restricted Carrying, Moving and Handling Objects Goal Status [phone]): 0 percent impaired, limited or restricted  Velora Mediate, OTR/L  09/03/2013, 2:27 PM  Physician Documentation Your signature is required to indicate approval of the treatment plan as stated above.  Please sign and either send electronically or make a copy of this report for your files and return this physician signed original.  Please mark one 1.__approve of plan  2. ___approve of plan with the following conditions.   ______________________________                                                          _____________________ Physician Signature                                                                                                             Date

## 2013-09-10 ENCOUNTER — Other Ambulatory Visit: Payer: Self-pay | Admitting: *Deleted

## 2013-09-10 ENCOUNTER — Telehealth (HOSPITAL_COMMUNITY): Payer: Self-pay | Admitting: Hematology and Oncology

## 2013-09-10 ENCOUNTER — Ambulatory Visit (INDEPENDENT_AMBULATORY_CARE_PROVIDER_SITE_OTHER): Payer: Medicare Other | Admitting: Family Medicine

## 2013-09-10 ENCOUNTER — Encounter: Payer: Self-pay | Admitting: Family Medicine

## 2013-09-10 ENCOUNTER — Ambulatory Visit (HOSPITAL_COMMUNITY)
Admission: RE | Admit: 2013-09-10 | Discharge: 2013-09-10 | Disposition: A | Discharge status: Home / Self Care | Payer: Medicare Other | Source: Ambulatory Visit | Attending: Family Medicine | Admitting: Family Medicine

## 2013-09-10 ENCOUNTER — Other Ambulatory Visit: Payer: Self-pay | Admitting: Family Medicine

## 2013-09-10 VITALS — BP 120/70 | Temp 98.0°F | Ht 68.0 in | Wt 189.0 lb

## 2013-09-10 DIAGNOSIS — J209 Acute bronchitis, unspecified: Secondary | ICD-10-CM

## 2013-09-10 DIAGNOSIS — R05 Cough: Secondary | ICD-10-CM | POA: Diagnosis not present

## 2013-09-10 DIAGNOSIS — J438 Other emphysema: Secondary | ICD-10-CM | POA: Insufficient documentation

## 2013-09-10 DIAGNOSIS — R059 Cough, unspecified: Secondary | ICD-10-CM | POA: Insufficient documentation

## 2013-09-10 DIAGNOSIS — R0602 Shortness of breath: Secondary | ICD-10-CM | POA: Insufficient documentation

## 2013-09-10 LAB — CBC WITH DIFFERENTIAL/PLATELET
Basophils Relative: 1 % (ref 0–1)
HCT: 39 % (ref 39.0–52.0)
Hemoglobin: 12.8 g/dL — ABNORMAL LOW (ref 13.0–17.0)
MCH: 29.8 pg (ref 26.0–34.0)
MCHC: 32.8 g/dL (ref 30.0–36.0)
Monocytes Absolute: 0.9 10*3/uL (ref 0.1–1.0)
Monocytes Relative: 15 % — ABNORMAL HIGH (ref 3–12)
Neutro Abs: 3.9 10*3/uL (ref 1.7–7.7)

## 2013-09-10 MED ORDER — AMOXICILLIN-POT CLAVULANATE ER 1000-62.5 MG PO TB12: 2.0000 | ORAL_TABLET | Freq: Two times a day (BID) | ORAL | Status: DC

## 2013-09-10 NOTE — Progress Notes (Signed)
  Subjective:    Patient ID: Jeremy Parrish, male    DOB: May 13, 1941, 72 y.o.   MRN: [id number]  Cough This is a new problem. The current episode started in the past 7 days. The problem has been gradually improving. Treatments tried: antibiotic. The treatment provided moderate relief.  Patient is here for a recheck on cough and wheezing from yesterday's visit.  Low-grade fever last night. Was given a shot of Rocephin yesterday. No other particular problems. PMH benign  Review of Systems  Respiratory: Positive for cough.    low-grade fever yesterday none today not respiratory distress coarse cough noted with increased lung sounds in the base heart regular extremities no edema     Objective:   Physical Exam See physical exam above       Assessment & Plan:  I discussed the case with oncology at hospital. Patient is not on any medicines that would cause severe inability to fight infection. Chest x-ray CBC ordered these came back overall looking fairly good discussion held with the family the importance of going to the hospital if worse. Otherwise Augmentin XR thousand milligrams 2 tablets twice a day 10 days albuterol when necessary followup if problems

## 2013-09-10 NOTE — Telephone Encounter (Signed)
See telephone message

## 2013-09-12 ENCOUNTER — Encounter: Payer: Self-pay | Admitting: *Deleted

## 2013-09-13 ENCOUNTER — Encounter (HOSPITAL_COMMUNITY): Payer: Self-pay | Admitting: Emergency Medicine

## 2013-09-13 ENCOUNTER — Ambulatory Visit (INDEPENDENT_AMBULATORY_CARE_PROVIDER_SITE_OTHER): Payer: Medicare Other | Admitting: Family Medicine

## 2013-09-13 ENCOUNTER — Encounter: Payer: Self-pay | Admitting: Family Medicine

## 2013-09-13 ENCOUNTER — Emergency Department (HOSPITAL_COMMUNITY): Payer: Medicare Other

## 2013-09-13 ENCOUNTER — Inpatient Hospital Stay (HOSPITAL_COMMUNITY)
Admission: EM | Admit: 2013-09-13 | Discharge: 2013-09-16 | DRG: 194 | Disposition: A | Discharge status: Home / Self Care | Payer: Medicare Other | Attending: Internal Medicine | Admitting: Internal Medicine

## 2013-09-13 VITALS — BP 120/72 | Temp 98.5°F | Ht 68.0 in | Wt 188.6 lb

## 2013-09-13 DIAGNOSIS — Z8546 Personal history of malignant neoplasm of prostate: Secondary | ICD-10-CM

## 2013-09-13 DIAGNOSIS — D801 Nonfamilial hypogammaglobulinemia: Secondary | ICD-10-CM | POA: Diagnosis not present

## 2013-09-13 DIAGNOSIS — E785 Hyperlipidemia, unspecified: Secondary | ICD-10-CM | POA: Diagnosis present

## 2013-09-13 DIAGNOSIS — I509 Heart failure, unspecified: Secondary | ICD-10-CM | POA: Diagnosis present

## 2013-09-13 DIAGNOSIS — D61818 Other pancytopenia: Secondary | ICD-10-CM | POA: Diagnosis not present

## 2013-09-13 DIAGNOSIS — Z85828 Personal history of other malignant neoplasm of skin: Secondary | ICD-10-CM

## 2013-09-13 DIAGNOSIS — Z8249 Family history of ischemic heart disease and other diseases of the circulatory system: Secondary | ICD-10-CM | POA: Diagnosis not present

## 2013-09-13 DIAGNOSIS — C9001 Multiple myeloma in remission: Secondary | ICD-10-CM | POA: Diagnosis present

## 2013-09-13 DIAGNOSIS — I5022 Chronic systolic (congestive) heart failure: Secondary | ICD-10-CM | POA: Diagnosis present

## 2013-09-13 DIAGNOSIS — F3289 Other specified depressive episodes: Secondary | ICD-10-CM | POA: Diagnosis present

## 2013-09-13 DIAGNOSIS — Z8701 Personal history of pneumonia (recurrent): Secondary | ICD-10-CM

## 2013-09-13 DIAGNOSIS — J189 Pneumonia, unspecified organism: Secondary | ICD-10-CM

## 2013-09-13 DIAGNOSIS — I10 Essential (primary) hypertension: Secondary | ICD-10-CM | POA: Diagnosis not present

## 2013-09-13 DIAGNOSIS — I2510 Atherosclerotic heart disease of native coronary artery without angina pectoris: Secondary | ICD-10-CM | POA: Diagnosis present

## 2013-09-13 DIAGNOSIS — Z9861 Coronary angioplasty status: Secondary | ICD-10-CM | POA: Diagnosis not present

## 2013-09-13 DIAGNOSIS — Z8551 Personal history of malignant neoplasm of bladder: Secondary | ICD-10-CM | POA: Diagnosis not present

## 2013-09-13 DIAGNOSIS — I739 Peripheral vascular disease, unspecified: Secondary | ICD-10-CM | POA: Diagnosis present

## 2013-09-13 DIAGNOSIS — Z87898 Personal history of other specified conditions: Secondary | ICD-10-CM

## 2013-09-13 DIAGNOSIS — Z87442 Personal history of urinary calculi: Secondary | ICD-10-CM | POA: Diagnosis not present

## 2013-09-13 DIAGNOSIS — F329 Major depressive disorder, single episode, unspecified: Secondary | ICD-10-CM | POA: Diagnosis present

## 2013-09-13 DIAGNOSIS — C9000 Multiple myeloma not having achieved remission: Secondary | ICD-10-CM | POA: Diagnosis not present

## 2013-09-13 DIAGNOSIS — I252 Old myocardial infarction: Secondary | ICD-10-CM | POA: Diagnosis not present

## 2013-09-13 DIAGNOSIS — Z9481 Bone marrow transplant status: Secondary | ICD-10-CM

## 2013-09-13 DIAGNOSIS — J811 Chronic pulmonary edema: Secondary | ICD-10-CM | POA: Diagnosis not present

## 2013-09-13 DIAGNOSIS — J209 Acute bronchitis, unspecified: Secondary | ICD-10-CM | POA: Diagnosis present

## 2013-09-13 DIAGNOSIS — K219 Gastro-esophageal reflux disease without esophagitis: Secondary | ICD-10-CM | POA: Diagnosis present

## 2013-09-13 DIAGNOSIS — Z87891 Personal history of nicotine dependence: Secondary | ICD-10-CM | POA: Diagnosis not present

## 2013-09-13 DIAGNOSIS — J440 Chronic obstructive pulmonary disease with acute lower respiratory infection: Secondary | ICD-10-CM | POA: Diagnosis present

## 2013-09-13 DIAGNOSIS — I69922 Dysarthria following unspecified cerebrovascular disease: Secondary | ICD-10-CM

## 2013-09-13 DIAGNOSIS — M19019 Primary osteoarthritis, unspecified shoulder: Secondary | ICD-10-CM | POA: Diagnosis present

## 2013-09-13 DIAGNOSIS — R509 Fever, unspecified: Secondary | ICD-10-CM

## 2013-09-13 LAB — CBC WITH DIFFERENTIAL/PLATELET
Eosinophils Absolute: 0.1 10*3/uL (ref 0.0–0.7)
Eosinophils Relative: 1 % (ref 0–5)
HCT: 34.6 % — ABNORMAL LOW (ref 39.0–52.0)
Lymphs Abs: 1 10*3/uL (ref 0.7–4.0)
MCH: 29.4 pg (ref 26.0–34.0)
MCV: 90.1 fL (ref 78.0–100.0)
Monocytes Absolute: 0.5 10*3/uL (ref 0.1–1.0)
Monocytes Relative: 14 % — ABNORMAL HIGH (ref 3–12)
Platelets: 114 10*3/uL — ABNORMAL LOW (ref 150–400)
RBC: 3.84 MIL/uL — ABNORMAL LOW (ref 4.22–5.81)

## 2013-09-13 LAB — BASIC METABOLIC PANEL
CO2: 25 mEq/L (ref 19–32)
Calcium: 9.3 mg/dL (ref 8.4–10.5)
GFR calc Af Amer: 84 mL/min — ABNORMAL LOW (ref >90)
Glucose, Bld: 101 mg/dL — ABNORMAL HIGH (ref 70–99)
Sodium: 137 mEq/L (ref 135–145)

## 2013-09-13 MED ORDER — SODIUM CHLORIDE 0.9 % IV SOLN: 250.0000 mL | INTRAVENOUS | Status: DC | PRN

## 2013-09-13 MED ORDER — SODIUM CHLORIDE 0.9 % IJ SOLN: 3.0000 mL | INTRAMUSCULAR | Status: DC | PRN

## 2013-09-13 MED ORDER — OXYCODONE HCL 5 MG PO TABS
5.0000 mg | ORAL_TABLET | Freq: Four times a day (QID) | ORAL | Status: DC | PRN
  Administered 2013-09-13 – 2013-09-14 (×2): 5 mg via ORAL
  Filled 2013-09-13 (×2): qty 1

## 2013-09-13 MED ORDER — ALUM & MAG HYDROXIDE-SIMETH 200-200-20 MG/5ML PO SUSP: 30.0000 mL | Freq: Four times a day (QID) | ORAL | Status: DC | PRN

## 2013-09-13 MED ORDER — ONDANSETRON HCL 4 MG PO TABS: 4.0000 mg | ORAL_TABLET | Freq: Four times a day (QID) | ORAL | Status: DC | PRN

## 2013-09-13 MED ORDER — ACETAMINOPHEN 325 MG PO TABS: 650.0000 mg | ORAL_TABLET | Freq: Four times a day (QID) | ORAL | Status: DC | PRN

## 2013-09-13 MED ORDER — ALBUTEROL SULFATE (5 MG/ML) 0.5% IN NEBU: 2.5000 mg | INHALATION_SOLUTION | RESPIRATORY_TRACT | Status: DC | PRN

## 2013-09-13 MED ORDER — ACETAMINOPHEN 650 MG RE SUPP: 650.0000 mg | Freq: Four times a day (QID) | RECTAL | Status: DC | PRN

## 2013-09-13 MED ORDER — LEVOFLOXACIN IN D5W 750 MG/150ML IV SOLN
750.0000 mg | INTRAVENOUS | Status: DC
  Administered 2013-09-13 – 2013-09-15 (×3): 750 mg via INTRAVENOUS
  Filled 2013-09-13 (×3): qty 150

## 2013-09-13 MED ORDER — TRAMADOL HCL 50 MG PO TABS
50.0000 mg | ORAL_TABLET | Freq: Four times a day (QID) | ORAL | Status: DC | PRN
  Administered 2013-09-13: 50 mg via ORAL
  Filled 2013-09-13: qty 1

## 2013-09-13 MED ORDER — SODIUM CHLORIDE 0.9 % IJ SOLN
3.0000 mL | Freq: Two times a day (BID) | INTRAMUSCULAR | Status: DC
  Administered 2013-09-14 – 2013-09-15 (×3): 3 mL via INTRAVENOUS

## 2013-09-13 MED ORDER — ONDANSETRON HCL 4 MG/2ML IJ SOLN: 4.0000 mg | Freq: Four times a day (QID) | INTRAMUSCULAR | Status: DC | PRN

## 2013-09-13 MED ORDER — IPRATROPIUM BROMIDE 0.02 % IN SOLN
0.5000 mg | Freq: Four times a day (QID) | RESPIRATORY_TRACT | Status: DC
  Administered 2013-09-13 – 2013-09-16 (×12): 0.5 mg via RESPIRATORY_TRACT
  Filled 2013-09-13 (×13): qty 2.5

## 2013-09-13 MED ORDER — HYDROCODONE-ACETAMINOPHEN 5-325 MG PO TABS
1.0000 | ORAL_TABLET | Freq: Once | ORAL | Status: AC
  Administered 2013-09-13: 1 via ORAL
  Filled 2013-09-13: qty 1

## 2013-09-13 MED ORDER — ALBUTEROL SULFATE (5 MG/ML) 0.5% IN NEBU
2.5000 mg | INHALATION_SOLUTION | Freq: Four times a day (QID) | RESPIRATORY_TRACT | Status: DC
  Administered 2013-09-13 – 2013-09-16 (×12): 2.5 mg via RESPIRATORY_TRACT
  Filled 2013-09-13 (×13): qty 0.5

## 2013-09-13 NOTE — Progress Notes (Signed)
Sputum cup in room and pt understand sputum needed and to call nurse when obtained.

## 2013-09-13 NOTE — Progress Notes (Signed)
  Subjective:    Patient ID: Jeremy Parrish, male    DOB: 06/01/1941, 72 y.o.   MRN: [id number]  HPI Patient arrives for follow up on cough and wheezing. progessive symptoms Patient relates over the weekend fever sweats chills felt bad took antibiotics as directed. Had a rough night. Feeling short of breath and moving around wife is concerned. Has a history of multiple myeloma  I spoke with oncology on Friday at that time patient wasn't doing that bad so therefore was not admitted now he is doing worse  Review of Systems  Constitutional: Positive for fever, chills, activity change (low energy) and fatigue.  HENT: Positive for congestion.   Respiratory: Positive for cough, shortness of breath and wheezing.   Cardiovascular: Negative for chest pain.  Gastrointestinal: Negative for vomiting and abdominal pain.       Objective:   Physical Exam  Constitutional: He appears well-developed and well-nourished.  HENT:  Head: Normocephalic.  Right Ear: External ear normal.  Left Ear: External ear normal.  Neck: Normal range of motion.  Cardiovascular: Normal rate, regular rhythm and normal heart sounds.   Pulmonary/Chest: Effort normal and breath sounds normal. No respiratory distress. He exhibits no tenderness.  Abdominal: Soft.  Musculoskeletal: He exhibits edema (trace edema in legs).  Lymphadenopathy:    He has no cervical adenopathy.          Assessment & Plan:  Progressive febrile illness/developing pneumonia History Multiple Myloma- mayt well need IV IG and antibiotics I tried to give a run down to the ER Dr. they were busy I spoke with JJ in the ER and sent the patient over to the ER for further evaluation, admission, treatment Spoke with Oncology Failed outpatient management

## 2013-09-13 NOTE — ED Notes (Signed)
Pt c/o prod cough and fever x 1 week. Seen pcp and finished z pack, had a shot and no better. Denies pain. Yellow sputum with coughing. Nad. No resp distress. Pt has garbled speech which wife states has been the same since stroke, is no worse.

## 2013-09-13 NOTE — H&P (Signed)
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 Because of known immunocompromise state and suspected pneumonia, referred for admission for antibiotics and IVIG on recommendations of oncology.  History obtained from patient and wife at bedside. Symptoms began 10/28 with general malaise and fever up to 102. Because of his history he was started on Zithromax 10/29 by oncology team. Initial chest x-ray was unremarkable. He saw his primary care physician in the office 2 days later he received an injection of Rocephin and was started on amoxicillin. Patient completed Zithromax 11/2 and has been compliant with amoxicillin. Despite this he continues to have daily fever, congestion, cough, rhinorrhea and has failed to improve. He does has history, oncology recommended admission to the hospital for IVIG administration.  In the emergency department noted be afebrile with stable vital signs. No hypoxia. Chest x-ray was unremarkable. CBC consistent with known pancytopenia. Basic metabolic panel is unremarkable.  Review of Systems:  Negative for visual changes, sore throat, rash, new muscle aches, chest pain, SOB, dysuria, bleeding, n/v/abdominal pain.  Past Medical History  Diagnosis Date  . Hypertension   . Heart disease   . Kidney stones     history  . Lung mass   . Heart murmur   . 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 Coronary artery disease   . Depression   . Shortness of breath   . Peripheral arterial disease   . Bladder neck contracture   . Personal history of other diseases  of circulatory system   . Aortic aneurysm of unspecified site without mention of rupture   . Arthritis   . Intestinovesical fistula   . Esophageal reflux   . Hyperlipidemia   . Anemia   . CHF (congestive heart failure)   . COPD (chronic obstructive pulmonary disease)   . Myocardial infarction   . Cerebral atherosclerosis     Carotid Doppler, 02/16/2013 - Bilateral Proximal ICAs,demonstrate mild plaque w/o evidence of significant diameter reduction, dissection, or any other vascular abnormality  . Complication of anesthesia   . PONV (postoperative nausea and vomiting)   . Stroke 2013    Speech.  . Cancer   . Prostate cancer 2000  . Multiple myeloma   . Hx of bladder cancer 10/07/2012  . Non Hodgkin's lymphoma     Past Surgical History  Procedure Laterality Date  . Prostate surgery    . Heart stents x 5  1999  . Portacath placement  07/26/2009  . Wrist surgery      right  . Left ear skin cancer removed    . Bone marrow transplant  2011  . Pacemaker insertion  07/22/2011    Medtronic  . Coronary angioplasty  06/24/2000    PCI and stenting in mid & proximal RCA  . Insert / replace / remove pacemaker    . Tee without cardioversion  10/13/2012    Procedure: TRANSESOPHAGEAL ECHOCARDIOGRAM (TEE);  Surgeon: Thurmon Fair, MD;  Location: [facility] ENDOSCOPY;  Service: Cardiovascular;  Laterality: N/A;  pat/kay/echo notified  . Colonoscopy N/A 01/01/2013    Procedure: COLONOSCOPY;  Surgeon: Malissa Hippo, MD;  Location: [facility] ENDO SUITE;  Service: Endoscopy;  Laterality: N/A;  825-moved to 940 Ann notified pt  . Rotator    . Rotator cuff repair Right   . Colon surgery      colon resection  . Bladder surgery    . Shoulder arthroscopy with subacromial decompression Right 07/21/2013    Procedure: RIGHT SHOULDER ARTHROSCOPY WITH SUBACROMIAL DECOMPRESSION AND DEBRIDEMENT & Injection of Left Shoulder;  Surgeon: Harvie Junior, MD;  Location: [facility] OR;  Service: Orthopedics;  Laterality: Right;  . US  echocardiography  06/19/2011    RV mildly dilated,mild to mod. MR,mild AI,mild PI  . Nm myocar perf wall motion  11/27/2007    inferior scar    Social History:  reports that he quit smoking about 18 years ago. His smoking use included Cigarettes. He has a 20 pack-year smoking history. He has never used smokeless tobacco. He reports that he does not drink alcohol or use illicit drugs.  Allergies  Allergen Reactions  . Diphenhydramine Hcl     REACTION: "hyper"  . Morphine And Related Other (See Comments)    hallucinations    Family History  Problem Relation Age of Onset  . Colon cancer Neg Hx   . Colon polyps Neg Hx   . Cancer Father     bladder  . Heart disease Father     before age 77  . Hypertension Mother   . Cancer Brother   . Heart disease Brother     before age 32  . Heart disease Sister     before age 28  . Hyperlipidemia Sister   . Hypertension Sister   . Heart attack Sister      Prior to Admission medications   Medication Sig Start Date End Date Taking? Authorizing Provider  amLODipine (NORVASC) 10 MG tablet Take 10 mg by mouth daily.     Yes Historical Provider, MD  amoxicillin-clavulanate (AUGMENTIN XR) 1000-62.5 MG per tablet Take 2 tablets by mouth 2 (two) times daily. 09/10/13  Yes Babs Sciara, MD  citalopram (CELEXA) 40 MG tablet Take 40 mg by mouth daily.    Yes Historical Provider, MD  clopidogrel (PLAVIX) 75 MG tablet Take 75 mg by mouth daily.  12/02/12  Yes Historical Provider, MD  gabapentin (NEURONTIN) 600 MG tablet Take 600 mg by mouth 3 (three) times daily.    Yes Historical Provider, MD  lenalidomide (REVLIMID) 10 MG capsule Take 1 capsule (10 mg total) by mouth daily. Take 1 capsule by mouth daily for 7 days on followed by 7 days off 08/12/13  Yes Ellouise Newer, PA-C  omeprazole (PRILOSEC) 20 MG capsule Take 1 capsule (20 mg total) by mouth daily. 03/22/13  Yes Merlyn Albert, MD  oxyCODONE-acetaminophen (PERCOCET/ROXICET) 5-325 MG per tablet  Take 1 tablet by mouth every 6 (six) hours as needed for pain.   Yes Historical Provider, MD  potassium chloride SA (KLOR-CON M20) 20 MEQ tablet Take 1 tablet (20 mEq total) by mouth 3 (three) times daily. 07/29/13  Yes Maurine Minister Kefalas, PA-C  simvastatin (ZOCOR) 20 MG tablet Take 20 mg by mouth at bedtime.     Yes Historical Provider, MD  traMADol (ULTRAM) 50 MG tablet Take 50 mg by mouth every 6 (six) hours as needed. 09/07/13  Yes Historical Provider, MD  nitroGLYCERIN (NITROSTAT) 0.4 MG SL tablet Place 1 tablet (0.4 mg total) under the tongue every 5 (five) minutes as needed for chest pain. 04/02/13   Babs Sciara, MD  Zoledronic Acid (ZOMETA) 4 MG/100ML IVPB  Inject 4 mg into the vein every 30 (thirty) days.    Historical Provider, MD   Physical Exam: Filed Vitals:   09/13/13 1115 09/13/13 1328 09/13/13 1430  BP: 136/71 122/70 119/69  Pulse: 68 70 65  Temp: 97.9 F (36.6 C)  98.9 F (37.2 C)  TempSrc: Oral  Oral  Resp: 24 15 16   Height: 5\' 8"  (1.727 m)    Weight: 83.915 kg (185 lb)    SpO2: 93% 99% 92%   General: Examined in the emergency department. Appears calm and comfortable Eyes: PERRL, normal lids, irises ENT: grossly normal hearing, lips & tongue. Right ear performed. Neck: no LAD, masses or thyromegaly Cardiovascular: RRR, no m/r/g. No LE edema. Respiratory: CTA bilaterally, no w/r/r. Normal respiratory effort. Abdomen: soft, ntnd Skin: no rash or induration seen  Musculoskeletal: grossly normal tone BUE/BLE Psychiatric: grossly normal mood and affect, speech slightly dysarthric, easily understandable. Appropriate. Neurologic: grossly non-focal.  Wt Readings from Last 3 Encounters:  09/13/13 83.915 kg (185 lb)  09/13/13 85.548 kg (188 lb 9.6 oz)  09/10/13 85.73 kg (189 lb)    Labs on Admission:  Basic Metabolic Panel:  Recent Labs Lab 09/13/13 1241  NA 137  K 3.9  CL 102  CO2 25  GLUCOSE 101*  BUN 10  CREATININE 1.02  CALCIUM 9.3    CBC:  Recent  Labs Lab 09/10/13 1200 09/13/13 1241  WBC 6.4 3.5*  NEUTROABS 3.9 1.9  HGB 12.8* 11.3*  HCT 39.0 34.6*  MCV 90.7 90.1  PLT 126* 114*    Radiological Exams on Admission: Dg Chest 2 View  09/13/2013   CLINICAL DATA:  Cough and fever.  EXAM: CHEST  2 VIEW  COMPARISON:  09/10/2013  FINDINGS: Right-sided Port-A-Cath is in place, tip overlying the level of the superior vena cava. Left-sided transvenous pacemaker leads overlying the right atrium and right ventricle.  The heart is enlarged. There are no focal consolidations or pleural effusions. Mild perihilar bronchitic changes are present. Minimal left base atelectasis or scarring is noted.  IMPRESSION: 1. Cardiomegaly without pulmonary edema. 2. Left lower lobe atelectasis or scarring.   Electronically Signed   By: Rosalie Gums M.D.   On: 09/13/2013 14:30     Principal Problem:   Fever Active Problems:   Hypogammaglobulinemia   Pneumonia   Pancytopenia   Assessment/Plan 1. Fever 2. Suspected pneumonia: Acute bronchitis in the differential. 2 x-rays with no acute disease. Failed outpatient therapy including Zithromax.Given immunocompromised state, check blood cultures, empiric antibiotics. Given treatment with amoxicillin Rocephin, start Levaquin. Doubt MRSA, hold off on vancomycin. 3. Hypogammaglobulinemia: Associated with recurrent infections. Consult hematology, consider IVIG. 4. 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 Appears stable. 5. Hypertension: Stable. 6. Coronary artery disease: Stable. 7. Chronic systolic congestive heart failure, LVEF 35-40% by echocardiogram 06/15/2013. Appears well compensated. 8. History of lymphoma, multiple myeloma, prostate cancer, bladder cancer  Code Status: full code  DVT prophylaxis: SCDS Family Communication: discussed with wife at bedside Disposition Plan/Anticipated LOS: admit, 2-4 days  Time spent: 66 minutes  Brendia Sacks, MD  [facility] Pager  [phone] 09/13/2013, 2:59 PM

## 2013-09-13 NOTE — ED Provider Notes (Signed)
CSN: [id number]     Arrival date & time 09/13/13  1103 History  This chart was scribed for Gilda Crease, MD by Bennett Scrape, ED Scribe. This patient was seen in room APA01/APA01 and the patient's care was started at 1:01 PM.   Chief Complaint  Patient presents with  . Cough  . Fever    The history is provided by the patient. No language interpreter was used.    HPI Comments: Jeremy Parrish is a 72 y.o. male who immunocompromised with pancytopenia and multiple myeloma presents to the Emergency Department complaining of persistent cough with associated chest congestion. He was seen on 10/29 for cough and chest congestion, started on Zithromax and give an injection of Rocephin by his PMD.  On 10/30, he was seen again and relative states that the symptoms were worse. The patient was given a second injection of Rocephin. His symptoms have not improved and he states that he developed a fever of 100.6 last night. He states that he has required IVIG treatments when he has infections in the past. He denies using albuterol or nebulizer treatments at home. He denies CP, back pain or abdominal pain. He has a h/o CVA with slow, intermittent garbled speech deficits. Wife denies any changes    PCP is Dr. Ardyth Gal  Past Medical History  Diagnosis Date  . Hypertension   . Heart disease   . Kidney stones     history  . Lung mass   . Heart murmur   . 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 Coronary artery disease   . Depression   . Shortness of breath   . Peripheral arterial disease   . Bladder neck contracture   . Personal history of other diseases of circulatory system   . Aortic aneurysm of unspecified site without mention of rupture   . Arthritis   . Intestinovesical fistula   . Esophageal reflux   . Hyperlipidemia   . Anemia   . CHF (congestive heart failure)   . COPD (chronic obstructive pulmonary disease)    . Myocardial infarction   . Cerebral atherosclerosis     Carotid Doppler, 02/16/2013 - Bilateral Proximal ICAs,demonstrate mild plaque w/o evidence of significant diameter reduction, dissection, or any other vascular abnormality  . Complication of anesthesia   . PONV (postoperative nausea and vomiting)   . Stroke 2013    Speech.  . Cancer   . Prostate cancer 2000  . Multiple myeloma   . Hx of bladder cancer 10/07/2012  . Non Hodgkin's lymphoma    Past Surgical History  Procedure Laterality Date  . Prostate surgery    . Heart stents x 5  1999  . Portacath placement  07/26/2009  . Wrist surgery      right  . Left ear skin cancer removed    . Bone marrow transplant  2011  . Pacemaker insertion  07/22/2011    [employer]  . Coronary angioplasty  06/24/2000    PCI and stenting in mid & proximal RCA  . Insert / replace / remove pacemaker    . Tee without cardioversion  10/13/2012    Procedure: TRANSESOPHAGEAL ECHOCARDIOGRAM (TEE);  Surgeon: Thurmon Fair, MD;  Location: [facility] ENDOSCOPY;  Service: Cardiovascular;  Laterality: N/A;  pat/kay/echo notified  . Colonoscopy N/A 01/01/2013    Procedure: COLONOSCOPY;  Surgeon: Malissa Hippo, MD;  Location: [facility] ENDO SUITE;  Service: Endoscopy;  Laterality: N/A;  825-moved to 940 Ann notified pt  . Rotator    . Rotator cuff repair Right   . Colon surgery      colon resection  . Bladder surgery    . Shoulder arthroscopy with subacromial decompression Right 07/21/2013    Procedure: RIGHT SHOULDER ARTHROSCOPY WITH SUBACROMIAL DECOMPRESSION AND DEBRIDEMENT & Injection of Left Shoulder;  Surgeon: Harvie Junior, MD;  Location: [facility] OR;  Service: Orthopedics;  Laterality: Right;  . US echocardiography  06/19/2011    RV mildly dilated,mild to mod. MR,mild AI,mild PI  . Nm myocar perf wall motion  11/27/2007    inferior scar   Family History  Problem Relation Age of Onset  . Colon cancer Neg Hx   . Colon polyps Neg Hx   . Cancer Father     bladder  . Heart  disease Father     before age 71  . Hypertension Mother   . Cancer Brother   . Heart disease Brother     before age 21  . Heart disease Sister     before age 39  . Hyperlipidemia Sister   . Hypertension Sister   . Heart attack Sister    History  Substance Use Topics  . Smoking status: Former Smoker -- 1.00 packs/day for 20 years    Types: Cigarettes    Quit date: 11/14/1994  . Smokeless tobacco: Never Used  . Alcohol Use: No     Comment: previously drank but none for at least 15 years.    Review of Systems  Constitutional: Negative for fever.  All other systems reviewed and are negative.    Allergies  Diphenhydramine hcl and Morphine and related  Home Medications   Current Outpatient Rx  Name  Route  Sig  Dispense  Refill  . ALPRAZolam (XANAX) 0.5 MG tablet   Oral   Take 0.5 mg by mouth at bedtime as needed for sleep.         Marland Kitchen amLODipine (NORVASC) 10 MG tablet   Oral   Take 10 mg by mouth daily.           Marland Kitchen amoxicillin-clavulanate (AUGMENTIN XR) 1000-62.5 MG per tablet   Oral   Take 2 tablets by mouth 2 (two) times daily.   40 tablet   0   . aspirin 81 MG tablet   Oral   Take 81 mg by mouth daily.         . citalopram (CELEXA) 40 MG tablet   Oral   Take 40 mg by mouth daily.          . clopidogrel (PLAVIX) 75 MG tablet   Oral   Take 75 mg by mouth daily.          Marland Kitchen gabapentin (NEURONTIN) 600 MG tablet   Oral   Take 600 mg by mouth 3 (three) times daily.          Marland Kitchen lenalidomide (REVLIMID) 10 MG capsule   Oral   Take 1 capsule (10 mg total) by mouth daily. Take 1 capsule by mouth daily for 7 days on followed by 7 days off   14 capsule   0   . nitroGLYCERIN (NITROSTAT) 0.4 MG SL tablet   Sublingual   Place 1 tablet (0.4 mg total) under the tongue every 5 (five) minutes as needed for chest pain.   25 tablet   12   . omeprazole (PRILOSEC) 20 MG capsule   Oral   Take 1 capsule (20 mg  total) by mouth daily.   90 capsule   1   .  oxyCODONE-acetaminophen (PERCOCET/ROXICET) 5-325 MG per tablet   Oral   Take 1-2 tablets by mouth every 6 (six) hours as needed for pain.   40 tablet   0   . potassium chloride SA (KLOR-CON M20) 20 MEQ tablet   Oral   Take 1 tablet (20 mEq total) by mouth 3 (three) times daily.   90 tablet   1   . simvastatin (ZOCOR) 20 MG tablet   Oral   Take 20 mg by mouth at bedtime.           . Zoledronic Acid (ZOMETA) 4 MG/100ML IVPB   Intravenous   Inject 4 mg into the vein every 30 (thirty) days.          Triage vitals: BP 136/71  Pulse 68  Temp(Src) 97.9 F (36.6 C) (Oral)  Resp 24  Ht 5\' 8"  (1.727 m)  Wt 185 lb (83.915 kg)  BMI 28.14 kg/m2  SpO2 93%  Physical Exam  Nursing note and vitals reviewed. Constitutional: He is oriented to person, place, and time. He appears well-developed and well-nourished. No distress.  HENT:  Head: Normocephalic and atraumatic.  Eyes: EOM are normal.  Neck: Neck supple. No tracheal deviation present.  Cardiovascular: Normal rate.   Pulmonary/Chest: Effort normal. No respiratory distress. He has rhonchi. He has rales.  Musculoskeletal: Normal range of motion.  Neurological: He is alert and oriented to person, place, and time.  Skin: Skin is warm and dry.  Psychiatric: He has a normal mood and affect. His behavior is normal.    ED Course  Procedures (including critical care time)  DIAGNOSTIC STUDIES: Oxygen Saturation is 93% on room air, adequate by my interpretation.    COORDINATION OF CARE: 1:06 PM-Discussed treatment plan which includes CXR, CBC panel and BMP with pt at bedside and pt agreed to plan.   Labs Review Labs Reviewed  CBC WITH DIFFERENTIAL - Abnormal; Notable for the following:    WBC 3.5 (*)    RBC 3.84 (*)    Hemoglobin 11.3 (*)    HCT 34.6 (*)    RDW 16.9 (*)    Platelets 114 (*)    Monocytes Relative 14 (*)    All other components within normal limits  BASIC METABOLIC PANEL   Imaging Review No results  found.  EKG Interpretation   None       MDM  Diagnosis: 1. COPD 2. Bronchitis 3. Failure of Outpatient therapy 4. Multiple myeloma  Was seen on 10/29 for cough and chest congestion, started on Zithromax. On 10/30, was seen again and symptoms were worse. The patient was given Rocephin injection, followed up and next day with a second injection of Rocephin. Over the weekend, symptoms have not improved, still running fever and no breathing is worse. Please refer to the ER by primary care doctor for likely admission secondary to failed outpatient treatment. Patient is immunocompromised with pancytopenia secondary to his multiple myeloma. He has required IVIG treatments when he has infections in the past, likely will once again. Patient will therefore require hospitalization.   Gilda Crease, MD 09/13/13 [phone]

## 2013-09-14 ENCOUNTER — Ambulatory Visit (HOSPITAL_COMMUNITY): Payer: Medicare Other | Admitting: Specialist

## 2013-09-14 ENCOUNTER — Encounter: Payer: Self-pay | Admitting: Cardiovascular Disease

## 2013-09-14 ENCOUNTER — Ambulatory Visit: Payer: Medicare Other | Admitting: Family Medicine

## 2013-09-14 DIAGNOSIS — I10 Essential (primary) hypertension: Secondary | ICD-10-CM

## 2013-09-14 DIAGNOSIS — J189 Pneumonia, unspecified organism: Secondary | ICD-10-CM | POA: Diagnosis not present

## 2013-09-14 DIAGNOSIS — D801 Nonfamilial hypogammaglobulinemia: Secondary | ICD-10-CM | POA: Diagnosis not present

## 2013-09-14 DIAGNOSIS — D61818 Other pancytopenia: Secondary | ICD-10-CM | POA: Diagnosis not present

## 2013-09-14 DIAGNOSIS — C9001 Multiple myeloma in remission: Secondary | ICD-10-CM

## 2013-09-14 LAB — BASIC METABOLIC PANEL
BUN: 10 mg/dL (ref 6–23)
CO2: 23 mEq/L (ref 19–32)
Glucose, Bld: 121 mg/dL — ABNORMAL HIGH (ref 70–99)
Potassium: 3.4 mEq/L — ABNORMAL LOW (ref 3.5–5.1)
Sodium: 138 mEq/L (ref 135–145)

## 2013-09-14 LAB — CBC
HCT: 33 % — ABNORMAL LOW (ref 39.0–52.0)
Hemoglobin: 11 g/dL — ABNORMAL LOW (ref 13.0–17.0)
MCH: 29.9 pg (ref 26.0–34.0)
MCHC: 33.3 g/dL (ref 30.0–36.0)
MCV: 89.7 fL (ref 78.0–100.0)
RBC: 3.68 MIL/uL — ABNORMAL LOW (ref 4.22–5.81)

## 2013-09-14 LAB — EXPECTORATED SPUTUM ASSESSMENT W GRAM STAIN, RFLX TO RESP C

## 2013-09-14 MED ORDER — TRAMADOL HCL 50 MG PO TABS: 50.0000 mg | ORAL_TABLET | Freq: Four times a day (QID) | ORAL | Status: DC | PRN

## 2013-09-14 MED ORDER — OXYCODONE HCL 5 MG PO TABS
5.0000 mg | ORAL_TABLET | ORAL | Status: DC | PRN
  Administered 2013-09-14: 5 mg via ORAL
  Filled 2013-09-14: qty 1

## 2013-09-14 MED ORDER — SIMVASTATIN 20 MG PO TABS
20.0000 mg | ORAL_TABLET | Freq: Every day | ORAL | Status: DC
  Administered 2013-09-14 – 2013-09-15 (×2): 20 mg via ORAL
  Filled 2013-09-14 (×2): qty 1

## 2013-09-14 MED ORDER — PANTOPRAZOLE SODIUM 40 MG PO TBEC
40.0000 mg | DELAYED_RELEASE_TABLET | Freq: Every day | ORAL | Status: DC
  Administered 2013-09-14 – 2013-09-16 (×3): 40 mg via ORAL
  Filled 2013-09-14 (×4): qty 1

## 2013-09-14 MED ORDER — OXYCODONE-ACETAMINOPHEN 5-325 MG PO TABS
1.0000 | ORAL_TABLET | Freq: Four times a day (QID) | ORAL | Status: DC | PRN
  Administered 2013-09-14 – 2013-09-16 (×5): 1 via ORAL
  Filled 2013-09-14 (×5): qty 1

## 2013-09-14 MED ORDER — CLOPIDOGREL BISULFATE 75 MG PO TABS
75.0000 mg | ORAL_TABLET | Freq: Every day | ORAL | Status: DC
  Administered 2013-09-14 – 2013-09-16 (×3): 75 mg via ORAL
  Filled 2013-09-14 (×4): qty 1

## 2013-09-14 MED ORDER — IMMUNE GLOBULIN (HUMAN) 5 GM/100ML IV SOLN
400.0000 mg/kg | INTRAVENOUS | Status: AC
  Administered 2013-09-14 – 2013-09-16 (×3): 35 g via INTRAVENOUS
  Filled 2013-09-14 (×3): qty 100

## 2013-09-14 MED ORDER — NITROGLYCERIN 0.4 MG SL SUBL: 0.4000 mg | SUBLINGUAL_TABLET | SUBLINGUAL | Status: DC | PRN

## 2013-09-14 MED ORDER — CITALOPRAM HYDROBROMIDE 20 MG PO TABS
40.0000 mg | ORAL_TABLET | Freq: Every day | ORAL | Status: DC
  Administered 2013-09-14 – 2013-09-15 (×2): 40 mg via ORAL
  Filled 2013-09-14 (×2): qty 2

## 2013-09-14 MED ORDER — LOPERAMIDE HCL 2 MG PO CAPS: 2.0000 mg | ORAL_CAPSULE | ORAL | Status: DC | PRN

## 2013-09-14 MED ORDER — POTASSIUM CHLORIDE CRYS ER 20 MEQ PO TBCR
20.0000 meq | EXTENDED_RELEASE_TABLET | Freq: Three times a day (TID) | ORAL | Status: DC
  Administered 2013-09-14 – 2013-09-16 (×6): 20 meq via ORAL
  Filled 2013-09-14 (×7): qty 1

## 2013-09-14 MED ORDER — AMLODIPINE BESYLATE 5 MG PO TABS
10.0000 mg | ORAL_TABLET | Freq: Every day | ORAL | Status: DC
  Administered 2013-09-14 – 2013-09-16 (×3): 10 mg via ORAL
  Filled 2013-09-14 (×4): qty 2

## 2013-09-14 MED ORDER — IMMUNE GLOBULIN (HUMAN) 10 GM/100ML IV SOLN: 0.4000 g/kg | INTRAVENOUS | Status: DC

## 2013-09-14 MED ORDER — GABAPENTIN 300 MG PO CAPS
600.0000 mg | ORAL_CAPSULE | Freq: Three times a day (TID) | ORAL | Status: DC
  Administered 2013-09-14 – 2013-09-16 (×6): 600 mg via ORAL
  Filled 2013-09-14 (×8): qty 2

## 2013-09-14 NOTE — Progress Notes (Signed)
Utilization Review Complete  

## 2013-09-14 NOTE — Consult Note (Signed)
[facility] Consultation Oncology  Name: Jeremy Parrish      MRN: [id number]    Location: W295/A213-08  Date: 09/14/2013 Time:8:44 AM   REFERRING PHYSICIAN:  Brendia Sacks, MD  REASON FOR CONSULT:  Recurrent pneumonia   DIAGNOSIS:  Multiple myleoma, in remission with hypogammaglobulinemia and history of lymphoma  HISTORY OF PRESENT ILLNESS:   Jeremy Parrish is a 72 year old Caucasian man who is well-known to the [facility] where he is treated on a maintenance regimen of Lenalidomide 10 mg every day which is maintaining his remission following .  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   I saw Md as a quick work-in on 09/08/2013 when he was receiving Zometa.  At that time, he was afebrile, denied any fevers or chills, but reported green sputum production.  With his history of infections, he was started on a Z-Pak.  The following day, he was seen by his PCP, Dr. Gerda Diss, who provided him a Subq injection of Rocephin and a chest Xray.  The following day on 09/10/2013, and he was started on Augmentin XR.  On 09/13/2013, Dr. Gerda Diss called the clinic and reported that the patient's lung exam was impressive for worsening auscultation of lungs and deemed outpatient treatment a failure.  I agreed with him and we discussed hospitalization for IV antibiotics and IVIg administration.  This AM, Muadh reports that he is feeling better.  He explains that he is coughing up green sputum still.    [name] reports that he has PT today at 9:30 and asked if I would call and cancel that appointment for him.  Additionally, he requests his pain medication be changed to every 4 hours PRN.    I provided the patient education regarding IVIG infusion which he is familiar with as he has received it in the past before.   Literature review shows a study name "Effect of immunoglobulin therapy on the rate of infections in multiple  myeloma patients undergoing autologous stem cell transplantation or treated with immunomodulatory agents" published in the [place] Journal of Hematology and Infectious Disease.  This study confimrs that severe bacterial infections occur in those treated for multiple myeloma, including those in the ASCT group.  "Lenolidomide is an immunomodulating drug of the IMiD class which has a similar mechanism of action to thalidomide.  Lenolidomide also shows anti-inflammatory effects, with downregulation of TNF-a and pro-inflammatory cytokine production."  A recent safety study demonstrated that  4% of patients sufferred from a severe infection when one Lenolidomide single-agent.  In this patient, there may be a role for maintenance IVIG per this study which demonstrated a reduction in hospital admission, use of IV antibiotics/antivirals after commencement of monthly IVIG (p<0.001).  We will discuss this further with the patient in the outpatient setting.  PAST MEDICAL HISTORY:   Past Medical History  Diagnosis Date  . Hypertension   . Heart disease   . Kidney stones     history  . Lung mass   . Heart murmur   . 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 Coronary artery disease   . Depression   . Shortness of breath   . Peripheral arterial disease   . Bladder neck contracture   . Personal history of other diseases of circulatory system   . Aortic aneurysm of unspecified site without mention of rupture   . Arthritis   .  Intestinovesical fistula   . Esophageal reflux   . Hyperlipidemia   . Anemia   . CHF (congestive heart failure)   . COPD (chronic obstructive pulmonary disease)   . Myocardial infarction   . Cerebral atherosclerosis     Carotid Doppler, 02/16/2013 - Bilateral Proximal ICAs,demonstrate mild plaque w/o evidence of significant diameter reduction, dissection, or any other vascular abnormality  . Complication of  anesthesia   . PONV (postoperative nausea and vomiting)   . Stroke 2013    Speech.  . Cancer   . Prostate cancer 2000  . Multiple myeloma   . Hx of bladder cancer 10/07/2012  . Non Hodgkin's lymphoma     ALLERGIES: Allergies  Allergen Reactions  . Diphenhydramine Hcl     REACTION: "hyper"  . Morphine And Related Other (See Comments)    hallucinations      MEDICATIONS: I have reviewed the patient's current medications.     PAST SURGICAL HISTORY Past Surgical History  Procedure Laterality Date  . Prostate surgery    . Heart stents x 5  1999  . Portacath placement  07/26/2009  . Wrist surgery      right  . Left ear skin cancer removed    . Bone marrow transplant  2011  . Pacemaker insertion  07/22/2011    [employer]  . Coronary angioplasty  06/24/2000    PCI and stenting in mid & proximal RCA  . Insert / replace / remove pacemaker    . Tee without cardioversion  10/13/2012    Procedure: TRANSESOPHAGEAL ECHOCARDIOGRAM (TEE);  Surgeon: Thurmon Fair, MD;  Location: [facility] ENDOSCOPY;  Service: Cardiovascular;  Laterality: N/A;  pat/kay/echo notified  . Colonoscopy N/A 01/01/2013    Procedure: COLONOSCOPY;  Surgeon: Malissa Hippo, MD;  Location: [facility] ENDO SUITE;  Service: Endoscopy;  Laterality: N/A;  825-moved to 940 Ann notified pt  . Rotator    . Rotator cuff repair Right   . Colon surgery      colon resection  . Bladder surgery    . Shoulder arthroscopy with subacromial decompression Right 07/21/2013    Procedure: RIGHT SHOULDER ARTHROSCOPY WITH SUBACROMIAL DECOMPRESSION AND DEBRIDEMENT & Injection of Left Shoulder;  Surgeon: Harvie Junior, MD;  Location: [facility] OR;  Service: Orthopedics;  Laterality: Right;  . US echocardiography  06/19/2011    RV mildly dilated,mild to mod. MR,mild AI,mild PI  . Nm myocar perf wall motion  11/27/2007    inferior scar    FAMILY HISTORY: Family History  Problem Relation Age of Onset  . Colon cancer Neg Hx   . Colon polyps Neg Hx   . Cancer  Father     bladder  . Heart disease Father     before age 46  . Hypertension Mother   . Cancer Brother   . Heart disease Brother     before age 74  . Heart disease Sister     before age 29  . Hyperlipidemia Sister   . Hypertension Sister   . Heart attack Sister     SOCIAL HISTORY:  reports that he quit smoking about 18 years ago. His smoking use included Cigarettes. He has a 20 pack-year smoking history. He has never used smokeless tobacco. He reports that he does not drink alcohol or use illicit drugs.  PERFORMANCE STATUS: The patient's performance status is 1 - Symptomatic but completely ambulatory  PHYSICAL EXAM: Most Recent Vital Signs: Blood pressure 116/70, pulse 72, temperature 98 F (36.7 C), temperature source  Oral, resp. rate 18, height 5\' 8"  (1.727 m), weight 185 lb (83.915 kg), SpO2 92.00%. General appearance: alert, cooperative, appears stated age and no distress Head: Normocephalic, without obvious abnormality, atraumatic Eyes: conjunctivae/corneas clear. PERRL, EOM's intact. Fundi benign. Neck: no adenopathy and supple, symmetrical, trachea midline Back: symmetric, no curvature. ROM normal. No CVA tenderness. Lungs: rhonchi bibasilar Heart: regular rate and rhythm, S1, S2 normal, no murmur, click, rub or gallop Skin: Skin color, texture, turgor normal. No rashes or lesions Neurologic: Grossly normal with stuttered speech secondary to history of stroke.  LABORATORY DATA:  Results for orders placed during the hospital encounter of 09/13/13 (from the past 48 hour(s))  CBC WITH DIFFERENTIAL     Status: Abnormal   Collection Time    09/13/13 12:41 PM      Result Value Range   WBC 3.5 (*) 4.0 - 10.5 K/uL   RBC 3.84 (*) 4.22 - 5.81 MIL/uL   Hemoglobin 11.3 (*) 13.0 - 17.0 g/dL   HCT 21.3 (*) 08.6 - 57.8 %   MCV 90.1  78.0 - 100.0 fL   MCH 29.4  26.0 - 34.0 pg   MCHC 32.7  30.0 - 36.0 g/dL   RDW 46.9 (*) 62.9 - 52.8 %   Platelets 114 (*) 150 - 400 K/uL    Comment: SPECIMEN CHECKED FOR CLOTS   Neutrophils Relative % 55  43 - 77 %   Neutro Abs 1.9  1.7 - 7.7 K/uL   Lymphocytes Relative 29  12 - 46 %   Lymphs Abs 1.0  0.7 - 4.0 K/uL   Monocytes Relative 14 (*) 3 - 12 %   Monocytes Absolute 0.5  0.1 - 1.0 K/uL   Eosinophils Relative 1  0 - 5 %   Eosinophils Absolute 0.1  0.0 - 0.7 K/uL   Basophils Relative 1  0 - 1 %   Basophils Absolute 0.0  0.0 - 0.1 K/uL  BASIC METABOLIC PANEL     Status: Abnormal   Collection Time    09/13/13 12:41 PM      Result Value Range   Sodium 137  135 - 145 mEq/L   Potassium 3.9  3.5 - 5.1 mEq/L   Chloride 102  96 - 112 mEq/L   CO2 25  19 - 32 mEq/L   Glucose, Bld 101 (*) 70 - 99 mg/dL   BUN 10  6 - 23 mg/dL   Creatinine, Ser 4.13  0.50 - 1.35 mg/dL   Calcium 9.3  8.4 - 24.4 mg/dL   GFR calc non Af Amer 72 (*) >90 mL/min   GFR calc Af Amer 84 (*) >90 mL/min   Comment: (NOTE)     The eGFR has been calculated using the CKD EPI equation.     This calculation has not been validated in all clinical situations.     eGFR's persistently <90 mL/min signify possible Chronic Kidney     Disease.  BASIC METABOLIC PANEL     Status: Abnormal   Collection Time    09/14/13  5:56 AM      Result Value Range   Sodium 138  135 - 145 mEq/L   Potassium 3.4 (*) 3.5 - 5.1 mEq/L   Chloride 104  96 - 112 mEq/L   CO2 23  19 - 32 mEq/L   Glucose, Bld 121 (*) 70 - 99 mg/dL   BUN 10  6 - 23 mg/dL   Creatinine, Ser 0.10  0.50 - 1.35 mg/dL  Calcium 9.0  8.4 - 10.5 mg/dL   GFR calc non Af Amer 83 (*) >90 mL/min   GFR calc Af Amer >90  >90 mL/min   Comment: (NOTE)     The eGFR has been calculated using the CKD EPI equation.     This calculation has not been validated in all clinical situations.     eGFR's persistently <90 mL/min signify possible Chronic Kidney     Disease.  CBC     Status: Abnormal   Collection Time    09/14/13  5:56 AM      Result Value Range   WBC 3.9 (*) 4.0 - 10.5 K/uL   RBC 3.68 (*) 4.22 - 5.81 MIL/uL    Hemoglobin 11.0 (*) 13.0 - 17.0 g/dL   HCT 19.1 (*) 47.8 - 29.5 %   MCV 89.7  78.0 - 100.0 fL   MCH 29.9  26.0 - 34.0 pg   MCHC 33.3  30.0 - 36.0 g/dL   RDW 62.1 (*) 30.8 - 65.7 %   Platelets 109 (*) 150 - 400 K/uL   Comment: SPECIMEN CHECKED FOR CLOTS      RADIOGRAPHY: Dg Chest 2 View  09/13/2013   CLINICAL DATA:  Cough and fever.  EXAM: CHEST  2 VIEW  COMPARISON:  09/10/2013  FINDINGS: Right-sided Port-A-Cath is in place, tip overlying the level of the superior vena cava. Left-sided transvenous pacemaker leads overlying the right atrium and right ventricle.  The heart is enlarged. There are no focal consolidations or pleural effusions. Mild perihilar bronchitic changes are present. Minimal left base atelectasis or scarring is noted.  IMPRESSION: 1. Cardiomegaly without pulmonary edema. 2. Left lower lobe atelectasis or scarring.   Electronically Signed   By: Rosalie Gums M.D.   On: 09/13/2013 14:30       ASSESSMENT:  1. Suspected pneumonia with green sputum production and worsening lung physical examination.  Chest Xray demonstrates left lower lobe atelectasis. 2. Hypogammaglobulinemia, secondary to #3 and 4 3. Multiple myeloma, in remission, on maintenance Lenolidomide daily (10 mg) following bone marrow transplant. 4. History of Lymphoma, S/P treatment 5. History of stroke affecting speech 6. 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   PLAN:  1. I personally reviewed and went over laboratory results with the patient. 2. I personally reviewed and went over radiographic studies with the patient. 3. Will order IVIG at 0.4 g/kg x 3 days 4. Will change Oxycodone 5 mg every 4 hours PRN for pain control and as an antitussive 5. Cancelled PT appointment for today and tomorrow. 6. Discussed the potential for maintenance IVIG as an outpatient.   Will discuss further in the future. 7. Will follow along as an inpatient.  All questions were answered. The  patient knows to call the clinic with any problems, questions or concerns. We can certainly see the patient much sooner if necessary.  Patient and plan discussed with Dr. Alla German and he is in agreement with the aforementioned.    Clayburn Weekly

## 2013-09-14 NOTE — Progress Notes (Addendum)
INITIAL NUTRITION ASSESSMENT  DOCUMENTATION CODES Per approved criteria  -Not Applicable   INTERVENTION: Heart Healthy diet per MD  Food preferences will be obtained for meals to maximize po intake  NUTRITION DIAGNOSIS: None at this time  Goal: Pt to meet >/= 90% of their estimated nutrition needs   Monitor:  Po intake, labs and wt trends  Reason for Assessment: Malnutrition Screen Score = 2  72 y.o. male  Admitting Dx: Fever  ASSESSMENT: Pt awake and responding appropriately to questions. His spouse is also present. Hx of multiple medical problems but despite this has been able to maintain weight without significant change. He reports good appetite until a few days ago and refuses oral supplements at this time. He is at risk for malnutrition in light of his PMH and current acute illness.  Height: Ht Readings from Last 1 Encounters:  09/13/13 5\' 8"  (1.727 m)    Weight: Wt Readings from Last 1 Encounters:  09/13/13 185 lb (83.915 kg)    Ideal Body Weight: 154# (70 kg)  % Ideal Body Weight: 120%  Wt Readings from Last 10 Encounters:  09/13/13 185 lb (83.915 kg)  09/13/13 188 lb 9.6 oz (85.548 kg)  09/10/13 189 lb (85.73 kg)  09/09/13 194 lb 4 oz (88.111 kg)  08/20/13 191 lb 1.6 oz (86.682 kg)  07/07/13 189 lb 8 oz (85.957 kg)  06/22/13 194 lb (87.998 kg)  06/11/13 194 lb (87.998 kg)  06/08/13 196 lb (88.905 kg)  05/19/13 188 lb (85.276 kg)    Usual Body Weight: 190-195#  % Usual Body Weight: 97%  BMI:  Body mass index is 28.14 kg/(m^2).overweight  Estimated Nutritional Needs: Kcal: 1800-2100 Protein: 84-91 gr Fluid: 1.8-2.1 liters daily  Skin: intact  Diet Order: Cardiac (po 100% breakfast)  EDUCATION NEEDS: -Education needs addressed   Intake/Output Summary (Last 24 hours) at 09/14/13 1039 Last data filed at 09/14/13 0646  Gross per 24 hour  Intake      0 ml  Output    500 ml  Net   -500 ml    Last BM: PTA  Labs:   Recent Labs Lab  09/13/13 1241 09/14/13 0556  NA 137 138  K 3.9 3.4*  CL 102 104  CO2 25 23  BUN 10 10  CREATININE 1.02 0.92  CALCIUM 9.3 9.0  GLUCOSE 101* 121*    CBG (last 3)  No results found for this basename: GLUCAP,  in the last 72 hours  Scheduled Meds: . ipratropium  0.5 mg Nebulization Q6H   And  . albuterol  2.5 mg Nebulization Q6H  . Immune Globulin 5%  400 mg/kg Intravenous Q24 Hr x 5  . levofloxacin (LEVAQUIN) IV  750 mg Intravenous Q24H  . sodium chloride  3 mL Intravenous Q12H    Continuous Infusions:   Past Medical History  Diagnosis Date  . Hypertension   . Heart disease   . Kidney stones     history  . Lung mass   . Heart murmur   . 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 Coronary artery disease   . Depression   . Shortness of breath   . Peripheral arterial disease   . Bladder neck contracture   . Personal history of other diseases of circulatory system   . Aortic aneurysm of unspecified site without mention of rupture   . Arthritis   . Intestinovesical fistula   . Esophageal reflux   .  Hyperlipidemia   . Anemia   . CHF (congestive heart failure)   . COPD (chronic obstructive pulmonary disease)   . Myocardial infarction   . Cerebral atherosclerosis     Carotid Doppler, 02/16/2013 - Bilateral Proximal ICAs,demonstrate mild plaque w/o evidence of significant diameter reduction, dissection, or any other vascular abnormality  . Complication of anesthesia   . PONV (postoperative nausea and vomiting)   . Stroke 2013    Speech.  . Cancer   . Prostate cancer 2000  . Multiple myeloma   . Hx of bladder cancer 10/07/2012  . Non Hodgkin's lymphoma     Past Surgical History  Procedure Laterality Date  . Prostate surgery    . Heart stents x 5  1999  . Portacath placement  07/26/2009  . Wrist surgery      right  . Left ear skin cancer removed    . Bone marrow transplant  2011  . Pacemaker insertion   07/22/2011    [employer]  . Coronary angioplasty  06/24/2000    PCI and stenting in mid & proximal RCA  . Insert / replace / remove pacemaker    . Tee without cardioversion  10/13/2012    Procedure: TRANSESOPHAGEAL ECHOCARDIOGRAM (TEE);  Surgeon: Thurmon Fair, MD;  Location: [facility] ENDOSCOPY;  Service: Cardiovascular;  Laterality: N/A;  pat/kay/echo notified  . Colonoscopy N/A 01/01/2013    Procedure: COLONOSCOPY;  Surgeon: Malissa Hippo, MD;  Location: [facility] ENDO SUITE;  Service: Endoscopy;  Laterality: N/A;  825-moved to 940 Ann notified pt  . Rotator    . Rotator cuff repair Right   . Colon surgery      colon resection  . Bladder surgery    . Shoulder arthroscopy with subacromial decompression Right 07/21/2013    Procedure: RIGHT SHOULDER ARTHROSCOPY WITH SUBACROMIAL DECOMPRESSION AND DEBRIDEMENT & Injection of Left Shoulder;  Surgeon: Harvie Junior, MD;  Location: [facility] OR;  Service: Orthopedics;  Laterality: Right;  . US echocardiography  06/19/2011    RV mildly dilated,mild to mod. MR,mild AI,mild PI  . Nm myocar perf wall motion  11/27/2007    inferior scar    Royann Shivers MS,RD,CSG,LDN Office: [phone] Pager: [phone]

## 2013-09-14 NOTE — Care Management Note (Unsigned)
    Page 1 of 1   09/14/2013     1:37:45 PM   CARE MANAGEMENT NOTE 09/14/2013  Patient:  Jeremy Parrish, Jeremy Parrish   Account Number:  [id number]  Date Initiated:  09/14/2013  Documentation initiated by:  Rosemary Holms  Subjective/Objective Assessment:   Pt admitted from home where he lives with his wife. Seeing [facility]. No anticipated HH needs per pt and wife who is at bedside     Action/Plan:   return home   Anticipated DC Date:     Anticipated DC Plan:  HOME/SELF CARE      DC Planning Services  CM consult      Choice offered to / List presented to:             Status of service:  Completed, signed off Medicare Important Message given?   (If response is "NO", the following Medicare IM given date fields will be blank) Date Medicare IM given:   Date Additional Medicare IM given:    Discharge Disposition:    Per UR Regulation:    If discussed at Long Length of Stay Meetings, dates discussed:    Comments:  09/14/13 Rosemary Holms RN BSN CM

## 2013-09-14 NOTE — Progress Notes (Signed)
[facility] PROGRESS NOTE  Jeremy Parrish ZOX:[id number] DOB: Jan 07, 1941 DOA: 09/13/2013 PCP: [name], MD  Assessment/Plan: 1. Suspected pneumonia: Acute arthritis in differential. Clinical diagnosis, x-ray without acute findings. Failed outpatient therapy with Zithromax. Because of immunocompromise state, follow blood cultures and continue empiric antibiotics. IVIG per oncology. Doubt MRSA. 2. Hypogammaglobinemia: Associated with recurrent infections. IVIG per oncology. 3. Chronic pancytopenia: Secondary to multiple myeloma, history of lymphoma. 4. Hypertension: Stable. 5. Coronary artery disease: Stable. Continue Plavix. 6. Chronic systolic congestive heart failure, LVEF 35-40% by echocardiogram 06/15/2013. Appears well compensated. 7. History of lymphoma, multiple myeloma, prostate cancer, bladder cancer   Continue empiric antibiotics. IVIG per oncology.  Pending studies:   Legionella antigen, strep pneumonia antigen  Blood cultures  Code Status: full code DVT prophylaxis: SCDs Family Communication: Discussed with wife at bedside Disposition Plan: Home when improved  Brendia Sacks, MD  [facility]  Pager [phone] If 7PM-7AM, please contact night-coverage at [email or web address], password [facility] 09/14/2013, 12:51 PM  LOS: 1 day   Summary: 72 year old male with history of lymphoma, multiple myeloma, pancytopenia, hypogammaglobinemia resulting in recurrent infections presented to the emergency department for persistent fever, cough. Because of known immunocompromise state and suspected pneumonia, referred for admission for antibiotics and IVIG on recommendations of oncology.  Consultants:  Oncology   Procedures:    Antibiotics:  Levaquin 11/3 >>   HPI/Subjective: Still coughing but breathing better. No new issues.   Objective: Filed Vitals:   09/14/13 0117 09/14/13 0123 09/14/13 0645 09/14/13 0715  BP:   116/70   Pulse:   72   Temp:   98 F (36.7 C)    TempSrc:   Oral   Resp:   18   Height:      Weight:      SpO2: 91% 91% 95% 92%    Intake/Output Summary (Last 24 hours) at 09/14/13 1251 Last data filed at 09/14/13 0900  Gross per 24 hour  Intake    240 ml  Output    500 ml  Net   -260 ml     Filed Weights   09/13/13 1115  Weight: 83.915 kg (185 lb)    Exam:   Afebrile. Vitals stable. No hypoxia.  Cardiovascular: Regular rate and rhythm. No murmur, rub or gallop. No lower extremity edema.  Respiratory: Some few wheezes. No rhonchi or rales. Normal respiratory effort. Able to speak in full senses.  General: Appears calm and comfortable. Speech slightly dysarthric.  Data Reviewed:  Potassium 3.4, basic metabolic panel otherwise unremarkable  Pancytopenia without significant change  Scheduled Meds: . ipratropium  0.5 mg Nebulization Q6H   And  . albuterol  2.5 mg Nebulization Q6H  . amLODipine  10 mg Oral Daily  . citalopram  40 mg Oral Daily  . clopidogrel  75 mg Oral Daily  . gabapentin  600 mg Oral TID  . Immune Globulin 5%  400 mg/kg Intravenous Q24 Hr x 5  . levofloxacin (LEVAQUIN) IV  750 mg Intravenous Q24H  . pantoprazole  40 mg Oral Daily  . potassium chloride SA  20 mEq Oral TID  . simvastatin  20 mg Oral QHS  . sodium chloride  3 mL Intravenous Q12H   Continuous Infusions:   Principal Problem:   Fever Active Problems:   Hypogammaglobulinemia   Pneumonia   Pancytopenia   Time spent 20 minutes

## 2013-09-15 ENCOUNTER — Encounter: Payer: Medicare Other | Admitting: Cardiovascular Disease

## 2013-09-15 DIAGNOSIS — R509 Fever, unspecified: Secondary | ICD-10-CM | POA: Diagnosis not present

## 2013-09-15 DIAGNOSIS — J189 Pneumonia, unspecified organism: Secondary | ICD-10-CM | POA: Diagnosis not present

## 2013-09-15 DIAGNOSIS — D61818 Other pancytopenia: Secondary | ICD-10-CM | POA: Diagnosis not present

## 2013-09-15 DIAGNOSIS — I10 Essential (primary) hypertension: Secondary | ICD-10-CM | POA: Diagnosis not present

## 2013-09-15 LAB — LEGIONELLA ANTIGEN, URINE: Legionella Antigen, Urine: NEGATIVE

## 2013-09-15 MED ORDER — FLUCONAZOLE 100 MG PO TABS
100.0000 mg | ORAL_TABLET | Freq: Every day | ORAL | Status: DC
  Administered 2013-09-15 – 2013-09-16 (×2): 100 mg via ORAL
  Filled 2013-09-15 (×3): qty 1

## 2013-09-15 NOTE — Progress Notes (Signed)
[facility] PROGRESS NOTE  Jeremy Parrish GEX:[id number] DOB: 1941/04/30 DOA: 09/13/2013 PCP: [name], MD  Assessment/Plan: 1. Community-acquired pneumonia: Strep pneumonia antigen positive in urine Failed outpatient therapy with Zithromax. Because of immunocompromise state, continue current treatments. IVIG per oncology with last dose scheduled for tomorrow. Doubt MRSA. Anticipate possible discharge home tomorrow if cleared by hematology. 2. Hypogammaglobinemia: Associated with recurrent infections. IVIG per oncology. 3. Chronic pancytopenia: Secondary to multiple myeloma, history of lymphoma. 4. Hypertension: Stable. 5. Coronary artery disease: Stable. Continue Plavix. 6. Chronic systolic congestive heart failure, LVEF 35-40% by echocardiogram 06/15/2013. Appears well compensated. 7. History of lymphoma, multiple myeloma, prostate cancer, bladder cancer  Code Status: full code DVT prophylaxis: SCDs Family Communication: Discussed with wife at bedside Disposition Plan: Home when improved, hopefully tomorrow  Jeremy Blinks, MD  [facility]  Pager [phone] If 7PM-7AM, please contact night-coverage at [email or web address], password [facility] 09/15/2013, 6:51 PM  LOS: 2 days   Summary: 72 year old male with history of lymphoma, multiple myeloma, pancytopenia, hypogammaglobinemia resulting in recurrent infections presented to the emergency department for persistent fever, cough. Because of known immunocompromise state and suspected pneumonia, referred for admission for antibiotics and IVIG on recommendations of oncology.  Consultants:  Oncology   Procedures:    Antibiotics:  Levaquin 11/3 >>   HPI/Subjective: Feels breathing is about the same. Is asking about possibly going home. Continues to cough.   Objective: Filed Vitals:   09/15/13 1610 09/15/13 1641 09/15/13 1710 09/15/13 1732  BP: 151/70 153/76 151/76 160/54  Pulse: 78 71 71 84  Temp: 98.2 F (36.8 C) 98.2 F  (36.8 C) 98.1 F (36.7 C) 99 F (37.2 C)  TempSrc: Oral Oral Oral Oral  Resp: 18 18 18 20   Height:      Weight:      SpO2: 91% 91% 93% 93%    Intake/Output Summary (Last 24 hours) at 09/15/13 1851 Last data filed at 09/15/13 1200  Gross per 24 hour  Intake    960 ml  Output      0 ml  Net    960 ml     Filed Weights   09/13/13 1115  Weight: 83.915 kg (185 lb)    Exam:   Afebrile. Vitals stable. No hypoxia.  Cardiovascular: Regular rate and rhythm. No murmur, rub or gallop. No lower extremity edema.  Respiratory: Some few wheezes. No rhonchi or rales. Normal respiratory effort. Able to speak in full senses.  General: Appears calm and comfortable. Speech slightly dysarthric.  Data Reviewed:  Potassium 3.4, basic metabolic panel otherwise unremarkable  Pancytopenia without significant change  Scheduled Meds: . ipratropium  0.5 mg Nebulization Q6H   And  . albuterol  2.5 mg Nebulization Q6H  . amLODipine  10 mg Oral Daily  . citalopram  40 mg Oral Daily  . clopidogrel  75 mg Oral Daily  . fluconazole  100 mg Oral Daily  . gabapentin  600 mg Oral TID  . Immune Globulin 5%  400 mg/kg Intravenous Q24 Hr x 5  . levofloxacin (LEVAQUIN) IV  750 mg Intravenous Q24H  . pantoprazole  40 mg Oral Daily  . potassium chloride SA  20 mEq Oral TID  . simvastatin  20 mg Oral QHS  . sodium chloride  3 mL Intravenous Q12H   Continuous Infusions:   Principal Problem:   Fever Active Problems:   Hypogammaglobulinemia   Pneumonia   Pancytopenia   Time spent 20 minutes

## 2013-09-15 NOTE — Progress Notes (Signed)
Subjective: Patient seen sitting in bed.    He reports that he feels better.  He slept well last night.  He denies any increased pain.  Still has a cough.  Objective: Vital signs in last 24 hours: Temp:  [97.8 F (36.6 C)-99.7 F (37.6 C)] 99.7 F (37.6 C) (11/05 0546) Pulse Rate:  [67-85] 72 (11/05 0546) Resp:  [20-22] 20 (11/05 0546) BP: (110-160)/(65-80) 135/65 mmHg (11/05 0546) SpO2:  [2 %-98 %] 2 % (11/05 0720)  Intake/Output from previous day: 11/04 0800 - 11/05 0759 In: 1500 [P.O.:1200; IV Piggyback:300] Out: 700 [Urine:700] Intake/Output this shift:    General appearance: alert, appears stated age and speech affected from stroke Resp: rhonchi bibasilar and worse on right  Lab Results:   Recent Labs  09/13/13 1241 09/14/13 0556  WBC 3.5* 3.9*  HGB 11.3* 11.0*  HCT 34.6* 33.0*  PLT 114* 109*   BMET  Recent Labs  09/13/13 1241 09/14/13 0556  NA 137 138  K 3.9 3.4*  CL 102 104  CO2 25 23  GLUCOSE 101* 121*  BUN 10 10  CREATININE 1.02 0.92  CALCIUM 9.3 9.0    Studies/Results: Dg Chest 2 View  09/13/2013   CLINICAL DATA:  Cough and fever.  EXAM: CHEST  2 VIEW  COMPARISON:  09/10/2013  FINDINGS: Right-sided Port-A-Cath is in place, tip overlying the level of the superior vena cava. Left-sided transvenous pacemaker leads overlying the right atrium and right ventricle.  The heart is enlarged. There are no focal consolidations or pleural effusions. Mild perihilar bronchitic changes are present. Minimal left base atelectasis or scarring is noted.  IMPRESSION: 1. Cardiomegaly without pulmonary edema. 2. Left lower lobe atelectasis or scarring.   Electronically Signed   By: Rosalie Gums M.D.   On: 09/13/2013 14:30    Medications: I have reviewed the patient's current medications.  Assessment/Plan: 1. Suspected pneumonia, negative blood cultures.  Positive strep pneumoniae urinary antigen.  On IV antibiotics (Levaquin).  Started IVIG on 09/14/2013 and he will  continue for a total of three days.  Will add diflucan 100 mg PO daily prophylactically.  2. Hypogammaglobulinemia, secondary to #3 and 4  3. Multiple myeloma, in remission, on maintenance Lenolidomide daily (10 mg) following bone marrow transplant. Lenolidomide on hold while on antibiotics. 4. History of Lymphoma, S/P treatment  5. History of stroke affecting speech  6. 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    Patient and plan discussed with Dr. Alla German and he is in agreement with the aforementioned.    LOS: 2 days    Brix Brearley 09/15/2013

## 2013-09-16 ENCOUNTER — Ambulatory Visit (HOSPITAL_COMMUNITY): Payer: Medicare Other | Admitting: Occupational Therapy

## 2013-09-16 DIAGNOSIS — J189 Pneumonia, unspecified organism: Secondary | ICD-10-CM | POA: Diagnosis not present

## 2013-09-16 DIAGNOSIS — D61818 Other pancytopenia: Secondary | ICD-10-CM | POA: Diagnosis not present

## 2013-09-16 DIAGNOSIS — C9000 Multiple myeloma not having achieved remission: Secondary | ICD-10-CM

## 2013-09-16 DIAGNOSIS — J069 Acute upper respiratory infection, unspecified: Secondary | ICD-10-CM

## 2013-09-16 DIAGNOSIS — R509 Fever, unspecified: Secondary | ICD-10-CM | POA: Diagnosis not present

## 2013-09-16 MED ORDER — FLUCONAZOLE 100 MG PO TABS: 100.0000 mg | ORAL_TABLET | Freq: Every day | ORAL | Status: DC

## 2013-09-16 MED ORDER — LEVOFLOXACIN 500 MG PO TABS
500.0000 mg | ORAL_TABLET | Freq: Every day | ORAL | Status: DC
  Administered 2013-09-16: 500 mg via ORAL
  Filled 2013-09-16: qty 1

## 2013-09-16 MED ORDER — LEVOFLOXACIN 500 MG PO TABS: 500.0000 mg | ORAL_TABLET | Freq: Every day | ORAL | Status: DC

## 2013-09-16 MED ORDER — HEPARIN SOD (PORK) LOCK FLUSH 100 UNIT/ML IV SOLN
500.0000 [IU] | INTRAVENOUS | Status: DC | PRN
  Filled 2013-09-16: qty 5

## 2013-09-16 MED ORDER — CITALOPRAM HYDROBROMIDE 20 MG PO TABS
20.0000 mg | ORAL_TABLET | Freq: Every day | ORAL | Status: DC
  Administered 2013-09-16: 20 mg via ORAL
  Filled 2013-09-16 (×2): qty 1

## 2013-09-16 NOTE — Progress Notes (Signed)
Subjective: Still with cough but with less expectoration. Denies hemoptysis. Minimal headache at the beginning of IgG infusions but currently non-. Denies any chest pain, PND, orthopnea, palpitations, lower extremity swelling or redness, worsening peripheral paresthesias, diarrhea, constipation, melena, hematochezia, urinary hesitancy, or incontinence. Tolerating IV levofloxacin and oral Diflucan.  REVIEW OF SYSTEM: Other than that discussed above is noncontributory.  Objective: Vital signs in last 24 hours: Temp:  [98 F (36.7 C)-100.1 F (37.8 C)] 98 F (36.7 C) (11/06 0456) Pulse Rate:  [67-84] 67 (11/06 0456) Resp:  [18-20] 18 (11/06 0456) BP: (113-160)/(54-77) 130/67 mmHg (11/06 0456) SpO2:  [91 %-97 %] 95 % (11/06 0456) FiO2 (%):  [21 %] 21 % (11/05 1435)  PHYSICAL EXAMINATION: No alopecia or sclerotic is. Neck supple without adenopathy. Chest was normal AP diameter with pacemaker in place. Lungs clear consultation is and percussion. Heart regular with no S3. Abdomen soft no distention. Liver and spleen not large. Extremities no edema, cyanosis, clubbing, or dark osseous. Neurologic exam with decreased DTRs in both lower extremities.  Lab Results:   Recent Labs  09/13/13 1241 09/14/13 0556  WBC 3.5* 3.9*  HGB 11.3* 11.0*  HCT 34.6* 33.0*  PLT 114* 109*   BMET  Recent Labs  09/13/13 1241 09/14/13 0556  NA 137 138  K 3.9 3.4*  CL 102 104  CO2 25 23  GLUCOSE 101* 121*  BUN 10 10  CREATININE 1.02 0.92  CALCIUM 9.3 9.0    Studies/Results: No results found.   Assessment/Plan: #1. Acute bronchitis with negative infiltrate on chest x-ray, status post 3 days of intravenous gammaglobulin in conjunction with intravenous Levaquin plus oral Diflucan, improved #2. Multiple myeloma, in remission, currently on maintenance therapy with Revlimid one-week on one-week off, currently treatment being held in lieu of possible infectious complication. #3.  Hypogammaglobulinemia, status post 3 days of IV IgG is 0.4 mg per kilogram. #4. History of stroke with dysarthria. #5. Degenerative joint disease of the shoulder, currently undergoing physical therapy as an outpatient. #6. No contraindication hematologically for discharge. Hold Revlimid until seen next week in oncology clinic. #7. Continue Levaquin orally at 500 mg daily along with Diflucan 100 mg daily until cancer Center followup  Principal Problem:   Fever Active Problems:   Hypogammaglobulinemia   Pneumonia   Pancytopenia    LOS: 3 days   Maurilio Lovely, MD  @TODAY @ 7:32 AM

## 2013-09-16 NOTE — Progress Notes (Signed)
PHARMACIST - PHYSICIAN COMMUNICATION DR:   Kerry Hough CONCERNING: Antibiotic IV to Oral Route Change Policy  RECOMMENDATION: This patient is receiving Levaquin by the intravenous route.  Based on criteria approved by the Pharmacy and Therapeutics Committee, the antibiotic(s) is/are being converted to the equivalent oral dose form(s).   DESCRIPTION: These criteria include:  Patient being treated for a respiratory tract infection, urinary tract infection, cellulitis or clostridium difficile associated diarrhea if on metronidazole  The patient is not neutropenic and does not exhibit a GI malabsorption state  The patient is eating (either orally or via tube) and/or has been taking other orally administered medications for a least 24 hours  The patient is improving clinically and has a Tmax < 100.5  Changed to Levaquin 500mg  po daily per heme recommendations.  If you have questions about this conversion, please contact the Pharmacy Department  [x]   [phone] )  [facility] []   [phone] )  [facility]  []   [phone] )  [facility] []   [phone] )  [facility]   Junita Push, PharmD, BCPS

## 2013-09-16 NOTE — Discharge Summary (Signed)
Physician Discharge Summary  Jeremy Parrish ZOX:[id number] DOB: 1941-08-11 DOA: 09/13/2013  PCP: [name], MD  Admit date: 09/13/2013 Discharge date: 09/16/2013  Time spent:  Recommendations for Outpatient Follow-up:  1. Followup with oncology service next week for resumption of chemotherapy and followup of pneumonia.  Discharge Diagnoses:  Principal Problem:   Community acquired pneumonia Active Problems:   Hypogammaglobulinemia   Pneumonia   Pancytopenia History of lymphoma, multiple myeloma, prostate cancer, bladder cancer History of chronic systolic congestive heart failure, ejection fraction 35-40%. Coronary artery disease Hypertension  Discharge Condition: improved  Diet recommendation: low salt  Filed Weights   09/13/13 1115  Weight: 83.915 kg (185 lb)    History of present illness:  72 year old male with history of lymphoma, multiple myeloma, pancytopenia, hypogammaglobinemia resulting in recurrent infections presented to the emergency department for persistent fever, cough. Because of known immunocompromise state and suspected pneumonia, referred for admission for antibiotics and IVIG on recommendations of oncology.  History obtained from patient and wife at bedside. Symptoms began 10/28 with general malaise and fever up to 102. Because of his history he was started on Zithromax 10/29 by oncology team. Initial chest x-ray was unremarkable. He saw his primary care physician in the office 2 days later he received an injection of Rocephin and was started on amoxicillin. Patient completed Zithromax 11/2 and has been compliant with amoxicillin. Despite this he continues to have daily fever, congestion, cough, rhinorrhea and has failed to improve. He does has history, oncology recommended admission to the hospital for IVIG administration.  In the emergency department noted be afebrile with stable vital signs. No hypoxia. Chest x-ray was unremarkable. CBC consistent  with known pancytopenia. Basic metabolic panel is unremarkable.   Hospital Course:  This patient was admitted to the hospital with fever, concern for pneumonia. He has hypogammaglobulinemia and was admitted for further treatment with antibiotics and IVIG. He was seen and followed by oncology. With antibiotic treatment and IVIG, his fevers resolved. His respiratory status has improved and he is ambulating comfortably on room air. Urine antigen for strep pneumonia was positive. Recommendations are to continue on oral antibiotics as well as oral Diflucan until followup with oncology next week. Recommendations are also to hold Revlimid until followed up by oncology next week. Patient is currently stable for discharge.  Procedures:  none  Consultations:  oncology  Discharge Exam: Filed Vitals:   09/16/13 1451  BP: 137/66  Pulse: 69  Temp: 98.6 F (37 C)  Resp:     General: NAD  Cardiovascular: s1, s2, rrr Respiratory: scattered rhonchi  Discharge Instructions  Discharge Orders   Future Appointments Provider Department Dept Phone   10/04/2013 9:20 AM [facility] [phone]   10/06/2013 10:15 AM [facility] [place] [facility] [phone]   11/19/2013 10:00 AM [facility] Covering Provider [facility] [phone]   12/09/2013 1:30 PM Ronal Fear, NP [facility] [phone]   01/25/2014 11:00 AM [facility] [address] [place] [facility] AT [facility] [phone]   01/25/2014 12:30 PM Pryor Ochoa, MD [facility] [phone]   Future Orders Complete By Expires   Call MD for:  difficulty breathing, headache or visual disturbances  As directed    Call MD for:  temperature >100.4  As directed    Diet - low sodium heart healthy  As directed    Increase activity slowly  As directed        Medication  List    STOP taking these medications       amoxicillin-clavulanate 1000-62.5 MG per  tablet  Commonly known as:  AUGMENTIN XR     lenalidomide 10 MG capsule  Commonly known as:  REVLIMID      TAKE these medications       amLODipine 10 MG tablet  Commonly known as:  NORVASC  Take 10 mg by mouth daily.     citalopram 40 MG tablet  Commonly known as:  CELEXA  Take 40 mg by mouth daily.     clopidogrel 75 MG tablet  Commonly known as:  PLAVIX  Take 75 mg by mouth daily.     fluconazole 100 MG tablet  Commonly known as:  DIFLUCAN  Take 1 tablet (100 mg total) by mouth daily.     gabapentin 600 MG tablet  Commonly known as:  NEURONTIN  Take 600 mg by mouth 3 (three) times daily.     levofloxacin 500 MG tablet  Commonly known as:  LEVAQUIN  Take 1 tablet (500 mg total) by mouth daily.     nitroGLYCERIN 0.4 MG SL tablet  Commonly known as:  NITROSTAT  Place 1 tablet (0.4 mg total) under the tongue every 5 (five) minutes as needed for chest pain.     omeprazole 20 MG capsule  Commonly known as:  PRILOSEC  Take 1 capsule (20 mg total) by mouth daily.     oxyCODONE-acetaminophen 5-325 MG per tablet  Commonly known as:  PERCOCET/ROXICET  Take 1 tablet by mouth every 6 (six) hours as needed for pain.     potassium chloride SA 20 MEQ tablet  Commonly known as:  KLOR-CON M20  Take 1 tablet (20 mEq total) by mouth 3 (three) times daily.     simvastatin 20 MG tablet  Commonly known as:  ZOCOR  Take 20 mg by mouth at bedtime.     traMADol 50 MG tablet  Commonly known as:  ULTRAM  Take 50 mg by mouth every 6 (six) hours as needed.     ZOMETA 4 MG/100ML IVPB  Generic drug:  Zoledronic Acid  Inject 4 mg into the vein every 30 (thirty) days.       Allergies  Allergen Reactions  . Diphenhydramine Hcl     REACTION: "hyper"  . Morphine And Related Other (See Comments)    hallucinations      The results of significant diagnostics from this hospitalization (including imaging, microbiology, ancillary and laboratory) are listed below for reference.     Significant Diagnostic Studies: Dg Chest 2 View  09/13/2013   CLINICAL DATA:  Cough and fever.  EXAM: CHEST  2 VIEW  COMPARISON:  09/10/2013  FINDINGS: Right-sided Port-A-Cath is in place, tip overlying the level of the superior vena cava. Left-sided transvenous pacemaker leads overlying the right atrium and right ventricle.  The heart is enlarged. There are no focal consolidations or pleural effusions. Mild perihilar bronchitic changes are present. Minimal left base atelectasis or scarring is noted.  IMPRESSION: 1. Cardiomegaly without pulmonary edema. 2. Left lower lobe atelectasis or scarring.   Electronically Signed   By: Rosalie Gums M.D.   On: 09/13/2013 14:30   Dg Chest 2 View  09/10/2013   CLINICAL DATA:  Cough and shortness of breath.  EXAM: CHEST  2 VIEW  COMPARISON:  Two-view chest 07/21/2013  FINDINGS: The heart size is normal. A right IJ Port-A-Cath is stable. Mild prominence of the pulmonary arteries is stable. Emphysematous changes  are again noted. No focal airspace disease is evident. The visualized soft tissues and bony thorax are unremarkable.  IMPRESSION: 1. Emphysema. 2. No acute cardiopulmonary disease.   Electronically Signed   By: Gennette Pac M.D.   On: 09/10/2013 12:50    Microbiology: Recent Results (from the past 240 hour(s))  CULTURE, BLOOD (ROUTINE X 2)     Status: None   Collection Time    09/13/13  1:34 PM      Result Value Range Status   Specimen Description BLOOD RIGHT ANTECUBITAL   Final   Special Requests     Final   Value: BOTTLES DRAWN AEROBIC AND ANAEROBIC 12CC  IMMUNE:COMPROMISED   Culture NO GROWTH 3 DAYS   Final   Report Status PENDING   Incomplete  CULTURE, BLOOD (ROUTINE X 2)     Status: None   Collection Time    09/13/13  1:35 PM      Result Value Range Status   Specimen Description BLOOD LEFT ANTECUBITAL   Final   Special Requests     Final   Value: BOTTLES DRAWN AEROBIC AND ANAEROBIC 12CC  IMMUNE:COMPROMISED   Culture NO GROWTH 3 DAYS    Final   Report Status PENDING   Incomplete  CULTURE, EXPECTORATED SPUTUM-ASSESSMENT     Status: None   Collection Time    09/14/13  6:50 PM      Result Value Range Status   Specimen Description SPUTUM EXPECTORATED   Final   Special Requests NONE   Final   Sputum evaluation     Final   Value: THIS SPECIMEN IS ACCEPTABLE. RESPIRATORY CULTURE REPORT TO FOLLOW.     Performed at [facility]   Report Status 09/14/2013 FINAL   Final  CULTURE, RESPIRATORY (NON-EXPECTORATED)     Status: None   Collection Time    09/14/13  6:50 PM      Result Value Range Status   Specimen Description SPUTUM EXPECTORATED   Final   Special Requests NONE   Final   Gram Stain     Final   Value: FEW WBC PRESENT,BOTH PMN AND MONONUCLEAR     FEW SQUAMOUS EPITHELIAL CELLS PRESENT     MODERATE GRAM POSITIVE COCCI     IN PAIRS IN CHAINS FEW GRAM NEGATIVE RODS     Performed at [facility]   Culture     Final   Value: Culture reincubated for better growth     Performed at [facility]   Report Status PENDING   Incomplete     Labs: Basic Metabolic Panel:  Recent Labs Lab 09/13/13 1241 09/14/13 0556  NA 137 138  K 3.9 3.4*  CL 102 104  CO2 25 23  GLUCOSE 101* 121*  BUN 10 10  CREATININE 1.02 0.92  CALCIUM 9.3 9.0   Liver Function Tests: No results found for this basename: AST, ALT, ALKPHOS, BILITOT, PROT, ALBUMIN,  in the last 168 hours No results found for this basename: LIPASE, AMYLASE,  in the last 168 hours No results found for this basename: AMMONIA,  in the last 168 hours CBC:  Recent Labs Lab 09/10/13 1200 09/13/13 1241 09/14/13 0556  WBC 6.4 3.5* 3.9*  NEUTROABS 3.9 1.9  --   HGB 12.8* 11.3* 11.0*  HCT 39.0 34.6* 33.0*  MCV 90.7 90.1 89.7  PLT 126* 114* 109*   Cardiac Enzymes: No results found for this basename: CKTOTAL, CKMB, CKMBINDEX, TROPONINI,  in the last 168 hours BNP: BNP (  last 3 results) No results found for this basename: PROBNP,  in the last 8760  hours CBG: No results found for this basename: GLUCAP,  in the last 168 hours     Signed:  MEMON,JEHANZEB  [facility] 09/16/2013, 4:00 PM

## 2013-09-17 ENCOUNTER — Other Ambulatory Visit (HOSPITAL_COMMUNITY): Payer: Self-pay | Admitting: Oncology

## 2013-09-17 DIAGNOSIS — C9000 Multiple myeloma not having achieved remission: Secondary | ICD-10-CM

## 2013-09-17 MED ORDER — LENALIDOMIDE 10 MG PO CAPS: 10.0000 mg | ORAL_CAPSULE | Freq: Every day | ORAL | Status: DC

## 2013-09-18 LAB — CULTURE, RESPIRATORY W GRAM STAIN

## 2013-09-18 LAB — CULTURE, BLOOD (ROUTINE X 2): Culture: NO GROWTH

## 2013-09-18 LAB — CULTURE, RESPIRATORY

## 2013-09-22 DIAGNOSIS — Z7983 Long term (current) use of bisphosphonates: Secondary | ICD-10-CM | POA: Diagnosis not present

## 2013-09-22 DIAGNOSIS — C9000 Multiple myeloma not having achieved remission: Secondary | ICD-10-CM | POA: Diagnosis not present

## 2013-09-22 DIAGNOSIS — Z9481 Bone marrow transplant status: Secondary | ICD-10-CM | POA: Diagnosis not present

## 2013-09-23 ENCOUNTER — Other Ambulatory Visit (HOSPITAL_COMMUNITY): Payer: Self-pay | Admitting: Oncology

## 2013-09-23 DIAGNOSIS — M12812 Other specific arthropathies, not elsewhere classified, left shoulder: Secondary | ICD-10-CM

## 2013-09-23 DIAGNOSIS — M75101 Unspecified rotator cuff tear or rupture of right shoulder, not specified as traumatic: Secondary | ICD-10-CM

## 2013-09-23 DIAGNOSIS — C9000 Multiple myeloma not having achieved remission: Secondary | ICD-10-CM

## 2013-09-23 DIAGNOSIS — M12811 Other specific arthropathies, not elsewhere classified, right shoulder: Secondary | ICD-10-CM

## 2013-09-23 DIAGNOSIS — M75102 Unspecified rotator cuff tear or rupture of left shoulder, not specified as traumatic: Secondary | ICD-10-CM

## 2013-09-23 DIAGNOSIS — C8589 Other specified types of non-Hodgkin lymphoma, extranodal and solid organ sites: Secondary | ICD-10-CM

## 2013-09-23 MED ORDER — OXYCODONE-ACETAMINOPHEN 5-325 MG PO TABS: 1.0000 | ORAL_TABLET | Freq: Four times a day (QID) | ORAL | Status: DC | PRN

## 2013-09-24 ENCOUNTER — Ambulatory Visit (INDEPENDENT_AMBULATORY_CARE_PROVIDER_SITE_OTHER): Payer: Medicare Other | Admitting: Cardiovascular Disease

## 2013-09-24 ENCOUNTER — Encounter: Payer: Self-pay | Admitting: Cardiovascular Disease

## 2013-09-24 VITALS — BP 128/66 | HR 66 | Ht 68.0 in | Wt 190.0 lb

## 2013-09-24 DIAGNOSIS — I498 Other specified cardiac arrhythmias: Secondary | ICD-10-CM | POA: Diagnosis not present

## 2013-09-24 DIAGNOSIS — Z8679 Personal history of other diseases of the circulatory system: Secondary | ICD-10-CM

## 2013-09-24 DIAGNOSIS — E785 Hyperlipidemia, unspecified: Secondary | ICD-10-CM

## 2013-09-24 DIAGNOSIS — I2510 Atherosclerotic heart disease of native coronary artery without angina pectoris: Secondary | ICD-10-CM | POA: Diagnosis not present

## 2013-09-24 DIAGNOSIS — I723 Aneurysm of iliac artery: Secondary | ICD-10-CM

## 2013-09-24 DIAGNOSIS — R001 Bradycardia, unspecified: Secondary | ICD-10-CM

## 2013-09-24 DIAGNOSIS — Z950 Presence of cardiac pacemaker: Secondary | ICD-10-CM

## 2013-09-24 DIAGNOSIS — I10 Essential (primary) hypertension: Secondary | ICD-10-CM

## 2013-09-24 LAB — MDC_IDC_ENUM_SESS_TYPE_INCLINIC
Battery Voltage: 3 V
Brady Statistic RA Percent Paced: 82.3 %
Brady Statistic RV Percent Paced: 2.2 %
Lead Channel Impedance Value: 384 Ohm
Lead Channel Impedance Value: 384 Ohm
Lead Channel Impedance Value: 392 Ohm
Lead Channel Pacing Threshold Amplitude: 1 V
Lead Channel Pacing Threshold Pulse Width: 0.4 ms
Lead Channel Pacing Threshold Pulse Width: 0.6 ms
Lead Channel Sensing Intrinsic Amplitude: 1.3 mV
Lead Channel Setting Pacing Amplitude: 2 V
Lead Channel Setting Pacing Pulse Width: 0.6 ms

## 2013-09-24 LAB — PACEMAKER DEVICE OBSERVATION

## 2013-09-24 NOTE — Patient Instructions (Signed)
Remote monitoring is used to monitor your pacemaker from home. This monitoring reduces the number of office visits required to check your device to one time per year. It allows us to keep an eye on the functioning of your device to ensure it is working properly. You are scheduled for a device check from home on 12-27-2013. You may send your transmission at any time that day. If you have a wireless device, the transmission will be sent automatically. After your physician reviews your transmission, you will receive a postcard with your next transmission date.  Your physician recommends that you schedule a follow-up appointment in: 12 months     

## 2013-09-27 NOTE — Progress Notes (Signed)
Patient ID: Jeremy Parrish, male   DOB: 09-03-1941, 72 y.o.   MRN: [id number]     Reason for office visit Pacemaker followup, CAD, recent stroke with residual expressive aphasia  Jeremy Parrish was recently hospitalized at [facility] secondary to pneumococcal pneumonia and has just finished taking his course of antibiotics. He is getting better and has only a very mild residual cough. In November 2013, he had ischemic stroke with left cortical infarct treated with intravenous thrombolytics, manifesting primarily with severe expressive aphasia. He has made substantial progress and is able to communicate much better now. He also has a history of coronary artery disease that dates back almost 20 years. He has previously received stents to the right coronary artery and left circumflex coronary artery. He has mild ischemic retinopathy with left ventricular ejection fraction around 45-50%. In 2012 he developed symptomatic bradycardia/chronotropic incompetence and received a dual-chamber permanent pacemaker (Medtronic Revo, MRI conditional). His pacemaker has not recorded any atrial flutter or fibrillation to explain his stroke. 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 He has hypogammaglobulinemia and receives periodic IVIG administration. He has also had bladder cancer. He had rotator cuff surgery in September   Allergies  Allergen Reactions  . Diphenhydramine Hcl     REACTION: "hyper"  . Morphine And Related Other (See Comments)    hallucinations    Current Outpatient Prescriptions  Medication Sig Dispense Refill  . amLODipine (NORVASC) 10 MG tablet Take 10 mg by mouth daily.        . citalopram (CELEXA) 40 MG tablet Take 40 mg by mouth daily.       .  clopidogrel (PLAVIX) 75 MG tablet Take 75 mg by mouth daily.       . fluconazole (DIFLUCAN) 100 MG tablet Take 1 tablet (100 mg total) by mouth daily.  7 tablet  0  . gabapentin (NEURONTIN) 600 MG tablet Take 600 mg by mouth 3 (three) times daily.       Marland Kitchen lenalidomide (REVLIMID) 10 MG capsule Take 1 capsule (10 mg total) by mouth daily. Take 1 capsule by mouth daily for 7 days on followed by 7 days off  14 capsule  0  . nitroGLYCERIN (NITROSTAT) 0.4 MG SL tablet Place 1 tablet (0.4 mg total) under the tongue every 5 (five) minutes as needed for chest pain.  25 tablet  12  . omeprazole (PRILOSEC) 20 MG capsule Take 1 capsule (20 mg total) by mouth daily.  90 capsule  1  . oxyCODONE-acetaminophen (PERCOCET/ROXICET) 5-325 MG per tablet Take 1 tablet by mouth every 6 (six) hours as needed.  60 tablet  0  . potassium chloride SA (KLOR-CON M20) 20 MEQ tablet Take 1 tablet (20 mEq total) by mouth 3 (three) times daily.  90 tablet  1  . simvastatin (ZOCOR) 20 MG tablet Take 20 mg by mouth at bedtime.        . traMADol (ULTRAM) 50 MG tablet Take 50 mg by mouth every 6 (six) hours as needed.      . Zoledronic Acid (ZOMETA) 4 MG/100ML IVPB Inject 4 mg into the vein every 30 (thirty) days.       No current facility-administered medications for this visit.    Past Medical History  Diagnosis Date  . Hypertension   .  Heart disease   . Kidney stones     history  . Lung mass   . Heart murmur   . 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 Coronary artery disease   . Depression   . Shortness of breath   . Peripheral arterial disease   . Bladder neck contracture   . Personal history of other diseases of circulatory system   . Aortic aneurysm of unspecified site without mention of rupture   . Arthritis   . Intestinovesical fistula   . Esophageal reflux   . Hyperlipidemia   . Anemia   . CHF (congestive heart failure)   . COPD (chronic  obstructive pulmonary disease)   . Myocardial infarction   . Cerebral atherosclerosis     Carotid Doppler, 02/16/2013 - Bilateral Proximal ICAs,demonstrate mild plaque w/o evidence of significant diameter reduction, dissection, or any other vascular abnormality  . Complication of anesthesia   . PONV (postoperative nausea and vomiting)   . Stroke 2013    Speech.  . Cancer   . Prostate cancer 2000  . Multiple myeloma   . Hx of bladder cancer 10/07/2012  . Non Hodgkin's lymphoma     Past Surgical History  Procedure Laterality Date  . Prostate surgery    . Heart stents x 5  1999  . Portacath placement  07/26/2009  . Wrist surgery      right  . Left ear skin cancer removed    . Bone marrow transplant  2011  . Pacemaker insertion  07/22/2011    Medtronic  . Coronary angioplasty  06/24/2000    PCI and stenting in mid & proximal RCA  . Insert / replace / remove pacemaker    . Tee without cardioversion  10/13/2012    Procedure: TRANSESOPHAGEAL ECHOCARDIOGRAM (TEE);  Surgeon: Thurmon Fair, MD;  Location: [facility] ENDOSCOPY;  Service: Cardiovascular;  Laterality: N/A;  pat/kay/echo notified  . Colonoscopy N/A 01/01/2013    Procedure: COLONOSCOPY;  Surgeon: Malissa Hippo, MD;  Location: [facility] ENDO SUITE;  Service: Endoscopy;  Laterality: N/A;  825-moved to 940 Ann notified pt  . Rotator    . Rotator cuff repair Right   . Colon surgery      colon resection  . Bladder surgery    . Shoulder arthroscopy with subacromial decompression Right 07/21/2013    Procedure: RIGHT SHOULDER ARTHROSCOPY WITH SUBACROMIAL DECOMPRESSION AND DEBRIDEMENT & Injection of Left Shoulder;  Surgeon: Harvie Junior, MD;  Location: [facility] OR;  Service: Orthopedics;  Laterality: Right;  . US echocardiography  06/19/2011    RV mildly dilated,mild to mod. MR,mild AI,mild PI  . Nm myocar perf wall motion  11/27/2007    inferior scar    Family History  Problem Relation Age of Onset  . Colon cancer Neg Hx   . Colon polyps Neg Hx   .  Cancer Father     bladder  . Heart disease Father     before age 74  . Hypertension Mother   . Cancer Brother   . Heart disease Brother     before age 23  . Heart disease Sister     before age 52  . Hyperlipidemia Sister   . Hypertension Sister   . Heart attack Sister     History   Social History  . Marital Status: Married    Spouse Name: Sheppard Evens    Number of Children: 3  . Years of Education: 9th  Occupational History  . retired     Social History Main Topics  . Smoking status: Former Smoker -- 1.00 packs/day for 20 years    Types: Cigarettes    Quit date: 11/14/1994  . Smokeless tobacco: Never Used  . Alcohol Use: No     Comment: previously drank but none for at least 15 years.  . Drug Use: No  . Sexual Activity: Not on file   Other Topics Concern  . Not on file   Social History Narrative   Patient lives at home spouse.   Caffeine Use: Occasionally    Review of systems: Expressive aphasia. Mild residual cough after pneumonia The patient specifically denies any chest pain at rest or with exertion, dyspnea at rest or with exertion, orthopnea, paroxysmal nocturnal dyspnea, syncope, palpitations, focal neurological deficits, intermittent claudication, lower extremity edema, unexplained weight gain, hemoptysis or wheezing.  The patient also denies abdominal pain, nausea, vomiting, dysphagia, diarrhea, constipation, polyuria, polydipsia, dysuria, hematuria, frequency, urgency, abnormal bleeding or bruising, fever, chills, unexpected weight changes, mood swings, change in skin or hair texture, change in voice quality, auditory or visual problems, allergic reactions or rashes, new musculoskeletal complaints other than usual "aches and pains".   PHYSICAL EXAM BP 128/66  Pulse 66  Ht 5\' 8"  (1.727 m)  Wt 190 lb (86.183 kg)  BMI 28.90 kg/m2  General: Alert, oriented x3, no distress Head: no evidence of trauma, PERRL, EOMI, no exophtalmos or lid lag, no myxedema, no  xanthelasma; normal ears, nose and oropharynx Neck: normal jugular venous pulsations and no hepatojugular reflux; brisk carotid pulses without delay and no carotid bruits Chest: clear to auscultation, no signs of consolidation by percussion or palpation, normal fremitus, symmetrical and full respiratory excursions, subclavian pacemaker site appears healthy Cardiovascular: normal position and quality of the apical impulse, regular rhythm, normal first and second heart sounds, no murmurs, rubs or gallops Abdomen: no tenderness or distention, no masses by palpation, no abnormal pulsatility or arterial bruits, normal bowel sounds, no hepatosplenomegaly Extremities: no clubbing, cyanosis or edema; 2+ radial, ulnar and brachial pulses bilaterally; 2+ right femoral, posterior tibial and dorsalis pedis pulses; 2+ left femoral, posterior tibial and dorsalis pedis pulses; no subclavian or femoral bruits Neurological: grossly nonfocal   EKG: Atrial paced ventricular sensed left anterior fascicular block no acute ischemic changes  Lipid Panel     Component Value Date/Time   CHOL 106 06/14/2013 0910   TRIG 142 06/14/2013 0910   HDL 25* 06/14/2013 0910   CHOLHDL 4.2 06/14/2013 0910   VLDL 28 06/14/2013 0910   LDLCALC 53 06/14/2013 0910    BMET    Component Value Date/Time   NA 138 09/14/2013 0556   K 3.4* 09/14/2013 0556   CL 104 09/14/2013 0556   CO2 23 09/14/2013 0556   GLUCOSE 121* 09/14/2013 0556   BUN 10 09/14/2013 0556   CREATININE 0.92 09/14/2013 0556   CREATININE 0.77 04/02/2013 1025   CALCIUM 9.0 09/14/2013 0556   GFRNONAA 83* 09/14/2013 0556   GFRAA >90 09/14/2013 0556     ASSESSMENT AND PLAN H/O cardiac pacemaker, Medtronic REVO, MRI conditional device, placed 07/2011 for sympyomatic bradycardia Pacemaker check in clinic. Normal device function. Thresholds, sensing, impedances are consistent with previous measurements. Device programmed to maximize longevity. No mode switch or high ventricular rates  noted. Device programmed at appropriate safety margins. Histogram distribution appropriate for patient activity level. Device programmed to optimize intrinsic conduction. RV output was increased from 2V to 2.5V for increased safety  margin. PMOP was enabled. Patient will follow up remotely on 12-27-2013,     CAD, dating to 1996 with multiple PCIs, Stents to RCA and LCX, Last cath 2007 patent stents,30% prox LAD lesion, EF 45% Free of angina. Normal recent nuclear stress test.  Hyperlipidemia LDL goal < 100  Lipid parameters are all within the desirable range.  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 Good ABIs with three-vessel runoff on the left. Note occlusion of the right posterior tibial with good reconstitution via collaterals   Orders Placed This Encounter  Procedures  . Implantable device check  . EKG 12-Lead   Patient Instructions  Remote monitoring is used to monitor your pacemaker from home. This monitoring reduces the number of office visits required to check your device to one time per year. It allows [place] to keep an eye on the functioning of your device to ensure it is working properly. You are scheduled for a device check from home on 12-27-2013. You may send your transmission at any time that day. If you have a wireless device, the transmission will be sent automatically. After your physician reviews your transmission, you will receive a postcard with your next transmission date.  Your physician recommends that you schedule a follow-up appointment in: 12 months        Paisely Brick  Thurmon Fair, MD, [facility] [phone] office [phone] pager

## 2013-09-27 NOTE — Assessment & Plan Note (Signed)
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 Good ABIs with three-vessel runoff on the left. Note occlusion of the right posterior tibial with good reconstitution via collaterals

## 2013-09-27 NOTE — Assessment & Plan Note (Signed)
Free of angina. Normal recent nuclear stress test.

## 2013-09-27 NOTE — Assessment & Plan Note (Signed)
Pacemaker check in clinic. Normal device function. Thresholds, sensing, impedances are consistent with previous measurements. Device programmed to maximize longevity. No mode switch or high ventricular rates noted. Device programmed at appropriate safety margins. Histogram distribution appropriate for patient activity level. Device programmed to optimize intrinsic conduction. RV output was increased from 2V to 2.5V for increased safety margin. PMOP was enabled. Patient will follow up remotely on 12-27-2013,

## 2013-09-27 NOTE — Assessment & Plan Note (Signed)
Blood pressure control at goal

## 2013-09-27 NOTE — Assessment & Plan Note (Signed)
Lipid parameters are all within the desirable range.

## 2013-09-28 ENCOUNTER — Ambulatory Visit (HOSPITAL_COMMUNITY)
Admission: RE | Admit: 2013-09-28 | Discharge: 2013-09-28 | Disposition: A | Discharge status: Home / Self Care | Payer: Medicare Other | Source: Ambulatory Visit | Attending: Family Medicine | Admitting: Family Medicine

## 2013-09-28 DIAGNOSIS — M25519 Pain in unspecified shoulder: Secondary | ICD-10-CM | POA: Insufficient documentation

## 2013-09-28 DIAGNOSIS — IMO0001 Reserved for inherently not codable concepts without codable children: Secondary | ICD-10-CM | POA: Diagnosis not present

## 2013-09-28 DIAGNOSIS — I10 Essential (primary) hypertension: Secondary | ICD-10-CM | POA: Insufficient documentation

## 2013-09-28 DIAGNOSIS — M6281 Muscle weakness (generalized): Secondary | ICD-10-CM | POA: Diagnosis not present

## 2013-09-28 NOTE — Evaluation (Addendum)
Occupational Therapy Evaluation  Patient Details  Name: [facility] MRN: [id number] Date of Birth: [birth year]  Today's Date: 09/28/2013 Time: 8657-8469 OT Time Calculation (min): 44 min Re-evaluation 0803-0833 (30') TherExercise 6295-2841 (14')  Visit#: 10 of 20  Re-eval: 10/27/13  Assessment Diagnosis: s/p Right shoulder arthroscopic debridement of cuff tear;  Left shoulder rotator cuff tear  Surgical Date: 07/21/13 Prior Therapy: recently receiving therapy on the right shoulder now with orders for left shoulder   Authorization: Medicare  Authorization Time Period: before 19th visit  Authorization Visit#: 10 of 19   Past Medical History:  Past Medical History  Diagnosis Date  . Hypertension   . Heart disease   . Kidney stones     history  . Lung mass   . Heart murmur   . 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 Coronary artery disease   . Depression   . Shortness of breath   . Peripheral arterial disease   . Bladder neck contracture   . Personal history of other diseases of circulatory system   . Aortic aneurysm of unspecified site without mention of rupture   . Arthritis   . Intestinovesical fistula   . Esophageal reflux   . Hyperlipidemia   . Anemia   . CHF (congestive heart failure)   . COPD (chronic obstructive pulmonary disease)   . Myocardial infarction   . Cerebral atherosclerosis     Carotid Doppler, 02/16/2013 - Bilateral Proximal ICAs,demonstrate mild plaque w/o evidence of significant diameter reduction, dissection, Parrish any other vascular abnormality  . Complication of anesthesia   . PONV (postoperative nausea and vomiting)   . Stroke 2013    Speech.  . Cancer   . Prostate cancer 2000  . Multiple myeloma   . Hx of bladder cancer 10/07/2012  . Non Hodgkin's lymphoma    Past Surgical History:  Past Surgical History  Procedure Laterality Date  . Prostate surgery    . Heart stents x  5  1999  . Portacath placement  07/26/2009  . Wrist surgery      right  . Left ear skin cancer removed    . Bone marrow transplant  2011  . Pacemaker insertion  07/22/2011    Medtronic  . Coronary angioplasty  06/24/2000    PCI and stenting in mid & proximal RCA  . Insert / replace / remove pacemaker    . Tee without cardioversion  10/13/2012    Procedure: TRANSESOPHAGEAL ECHOCARDIOGRAM (TEE);  Surgeon: Jeremy Fair, MD;  Location: [facility];  Service: Cardiovascular;  Laterality: N/A;  pat/kay/echo notified  . Colonoscopy N/A 01/01/2013    Procedure: COLONOSCOPY;  Surgeon: Jeremy Hippo, MD;  Location: [facility];  Service: Endoscopy;  Laterality: N/A;  825-moved to 940 Ann notified pt  . Rotator    . Rotator cuff repair Right   . Colon surgery      colon resection  . Bladder surgery    . Shoulder arthroscopy with subacromial decompression Right 07/21/2013    Procedure: RIGHT SHOULDER ARTHROSCOPY WITH SUBACROMIAL DECOMPRESSION AND DEBRIDEMENT & Injection of Left Shoulder;  Surgeon: Jeremy Junior, MD;  Location: [facility];  Service: Orthopedics;  Laterality: Right;  . US echocardiography  06/19/2011    RV mildly dilated,mild to mod. MR,mild AI,mild PI  . Nm myocar perf wall motion  11/27/2007    inferior scar    Subjective Symptoms/Limitations Symptoms: S:  I havent had any  therapy for the last 2-3 weeks  Pertinent History: [name] is a 72 year old male with a history of having had a previous rotator cuff repair.  He had continued to have pain.  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 Patient underwent a righth shoulder arthroscopy on 07/21/13.    He returned to MD Dr. Luiz Parrish on 10/28 and received new script for continued therapy on his right shoulder and new order for evaluation and treatment of his left shoulder for rotator cuff tear as well.   Patient with recent hospitalization for pneumonia and returns this date for therapy services.  Limitations: progress as  tolerated  Patient Stated Goals: To use my arms without pain  Pain Assessment Currently in Pain?: Yes Pain Score: 4  Pain Location: Shoulder Pain Orientation: Right;Left Pain Type: Acute pain;Chronic pain Pain Frequency: Constant Multiple Pain Sites: No  Precautions/Restrictions  Restrictions Weight Bearing Restrictions: No   Prior Functioning  Home Living Family/patient expects to be discharged to:: Private residence Living Arrangements: Spouse/significant other Prior Function Level of Independence: Independent with basic ADLs;Independent with gait  Able to Take Stairs?: Yes Driving: Yes Vocation: Retired Leisure: Hobbies-yes (Comment) Comments: Used to like to fish and hunt. Does not do these activities any more. Enjoys attending [facility] football games.  Assessment ADL/Vision/Perception ADL ADL Comments: reports continued limited in attaining milk from fridge; Improvement in attaining cup/light objects from cabinets.  Dominant Hand: Right Vision - History Baseline Vision: Wears glasses all the time  Cognition/Observation Cognition Overall Cognitive Status: Within Functional Limits for tasks assessed Arousal/Alertness: Awake/alert Orientation Level: Oriented X4  Additional Assessments RUE AROM (degrees) Right Shoulder Flexion: 148 Degrees Right Shoulder ABduction: 154 Degrees Right Shoulder Internal Rotation: 68 Degrees Right Shoulder External Rotation: 80 Degrees RUE Strength Right Shoulder Flexion:  (4-/5  (4-/5)) Right Shoulder ABduction:  (4-/5 (4-/5)) Right Shoulder Internal Rotation: 4/5 ((4/5)) Right Shoulder External Rotation: 3+/5 (3+/5) LUE AROM (degrees) Left Shoulder Flexion: 136 Degrees Left Shoulder ABduction: 139 Degrees Left Shoulder Internal Rotation: 73 Degrees Left Shoulder External Rotation: 69 Degrees LUE Strength Left Shoulder Flexion:  (4-/5) Left Shoulder ABduction: 3+/5 Left Shoulder Internal Rotation: 3+/5 Left Shoulder  External Rotation: 3+/5 Palpation Palpation: Min fascial restrictions in bilateral upper arm, trapezius, and scapularis regions     Exercise/Treatments Pulleys Flexion: 2 minutes ABduction: 2 minutes   ROM / Strengthening / Isometric Strengthening UBE (Upper Arm Bike): 3' forward and 3' reverse at 1.0 resistance Wall Wash: 2'      Occupational Therapy Assessment and Plan OT Assessment and Plan Clinical Impression Statement: A:  Patient presents this date s/p recent hospitalization for pneumonia.   Patient continues with pain in bilateral shoulders, decreased strength, decreased range and decreased functional use of bilateral UE.  Patient with decreased activity tolerance/endurance this date wtih increased complaint of fatigue during exericises this date.   Pt will benefit from skilled therapeutic intervention in order to improve on the following deficits: Increased fascial restricitons;Decreased range of motion;Pain;Decreased strength Rehab Potential: Excellent OT Frequency: Min 2X/week OT Duration: 4 weeks OT Treatment/Interventions: Self-care/ADL training;Therapeutic activities;Therapeutic exercise;Manual therapy;Modalities;Patient/family education OT Plan: P: Complete FOTO.  Complete strengthening exercises independently with decreased c/o pain . UE activity tolerance to increased functional use in ADL tasks/activites.   Goals Home Exercise Program Pt/caregiver will Perform Home Exercise Program: For increased ROM;For increased strengthening PT Goal: Perform Home Exercise Program - Progress: Goal set today Short Term Goals Time to Complete Short Term Goals: 2 weeks Short Term  Goal 3: Patient will decrease pain in his bilateral shoulders/arms to </= 2/10 when participting in daily activities.  Short Term Goal 3 Progress: Other (comment) Short Term Goal 4: Patient will have min-mod fascial restrictions in bilateral  arms.  Short Term Goal 4 Progress: Other (comment) Long Term  Goals Time to Complete Long Term Goals: 4 weeks Long Term Goal 1: Patient will return to highest level of independence with all leisure and daily activities.  Long Term Goal 1 Progress: Progressing toward goal Long Term Goal 2: Patient will have 4+/5 strength Bilateral UE  for increased ability to participate in daily activities.  Long Term Goal 3: Patient will decrease pain in his bilateral arms to 1/10 when participting in daily activities. Long Term Goal 4: Patient will have trace-min fascial restrictions in bilateral  arms.  Long Term Goal 5: Patient will increase AROM to WNL in bilateral shoulder for increased independence with reaching into overhead cabinets.   Problem List Patient Active Problem List   Diagnosis Date Noted  . Fever 09/13/2013  . Pneumonia 09/13/2013  . Pancytopenia 09/13/2013  . Muscle weakness (generalized) 08/04/2013  . Status post arthroscopy of shoulder 08/04/2013  . Pain in joint, shoulder region 08/04/2013  . Rotator cuff tear arthropathy of right shoulder 07/21/2013  . Left rotator cuff tear arthropathy 07/21/2013  . Shortness of breath 07/07/2013  . 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 Hyperlipidemia LDL goal < 100 10/08/2012  . Prediabetes 10/08/2012  . Expressive aphasia 10/08/2012  . Hemiplegia affecting right dominant side 10/08/2012  . 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 Hx of bladder cancer 10/07/2012  . Cerebral embolism with cerebral infarction 10/06/2012  . 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 ASTHMA, UNSPECIFIED 03/22/2010  . Nonspecific (abnormal) findings on radiological and other examination of body structure 03/22/2010  . LYMPHOMA 03/21/2010  . MULTIPLE  MYELOMA 03/21/2010  . ANXIETY 03/21/2010  . HYPERTENSION 03/21/2010  . MYOCARDIAL INFARCTION 03/21/2010  . CAD, dating to 1996 with multiple PCIs, Stents to RCA and LCX, Last cath 2007 patent  stents,30% prox LAD lesion, EF 45% 03/21/2010  . NEPHROLITHIASIS 03/21/2010  . ELEVATED PROSTATE SPECIFIC ANTIGEN 03/21/2010  . ROTATOR CUFF REPAIR, RIGHT, HX OF 03/21/2010    End of Session Activity Tolerance: Patient tolerated treatment well General Behavior During Therapy: [facility] for tasks assessed/performed  GO Functional Limitation: Carrying, moving and handling objects Carrying, Moving and handling Objects Current Status (W0981): 1 to 20 Percent CI Carrying, Moving and Handling Objects Goal Status (X9147): 0 percent impaired, limited [facility] restricted  Velora Mediate, OTR/L 09/28/2013, 2:55 PM  Physician Documentation Your signature is required to indicate approval of the treatment plan as stated above.  Please sign and either send electronically [facility] make a copy of this report for your files and return this physician signed original.  Please mark one 1.__approve of plan  2. ___approve of plan with the following conditions.   ______________________________                                                          _____________________ Physician Signature  Date  

## 2013-09-30 ENCOUNTER — Ambulatory Visit (HOSPITAL_COMMUNITY)
Admission: RE | Admit: 2013-09-30 | Discharge: 2013-09-30 | Disposition: A | Discharge status: Home / Self Care | Payer: Medicare Other | Source: Ambulatory Visit | Attending: Family Medicine | Admitting: Family Medicine

## 2013-09-30 DIAGNOSIS — I10 Essential (primary) hypertension: Secondary | ICD-10-CM | POA: Diagnosis not present

## 2013-09-30 DIAGNOSIS — M25519 Pain in unspecified shoulder: Secondary | ICD-10-CM | POA: Diagnosis not present

## 2013-09-30 DIAGNOSIS — IMO0001 Reserved for inherently not codable concepts without codable children: Secondary | ICD-10-CM | POA: Diagnosis not present

## 2013-09-30 DIAGNOSIS — M6281 Muscle weakness (generalized): Secondary | ICD-10-CM | POA: Diagnosis not present

## 2013-09-30 NOTE — Progress Notes (Signed)
Jeremy Asa, MD [address] [place] [address]  MULTIPLE  MYELOMA - Plan: CBC with Differential, Comprehensive metabolic panel, Lactate dehydrogenase, Multiple myeloma panel, serum, Kappa/lambda light chains  LYMPHOMA - Plan: CBC with Differential, Comprehensive metabolic panel, Lactate dehydrogenase, Multiple myeloma panel, serum, Kappa/lambda light chains  Hypogammaglobulinemia  CURRENT THERAPY:Revlimid 10 mg 7 days on and 7 days off and Zometa every 4 weeks.  Also starting IVIG low-dose every 4 weeks as well.  INTERVAL HISTORY: [facility] 72 y.o. 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 Greggory [facility] at [facility] October 2011. Now on maintenance Revlimid which he is tolerating well.   [facility] was admitted to the [facility] on 09/13/2013- 09/16/2013 for failure of outpatient management of pneumonia.  He received IVIG while in the hospital.  He has recovered from that infection without any issues today.  He reports that he is feeling well today.  He denies any fevers and chills.  He does have a non-productive cough that he reports is minimal.  His physical exam is benign today.  He was educated on solid hand hygiene practices.   I personally reviewed and went over laboratory results with the patient.  We will repeat labs today in preparation for Zometa infusion on 11/26.  Literature review shows a study name "Effect of immunoglobulin therapy on the rate of infections in multiple myeloma patients undergoing autologous stem cell transplantation or treated with immunomodulatory agents" published in the [place] Journal of Hematology and Infectious Disease. This study confirms that severe bacterial infections occur in those treated for multiple myeloma, including those in the ASCT group. "Lenolidomide is an immunomodulating drug of the IMiD  class which has a similar mechanism of action to thalidomide. Lenolidomide also shows anti-inflammatory effects, with downregulation of TNF-a and pro-inflammatory cytokine production." A recent safety study demonstrated that 4% of patients sufferred from a severe infection when one Lenolidomide single-agent. In this patient, there may be a role for maintenance IVIG per this study which demonstrated a reduction in hospital admission, use of IV antibiotics/antivirals after commencement of monthly IVIG (p<0.001).  He is agreeable to a maintenance program of IVIG every 4 weeks.  This will hopefully decrease [facility]'s infection rate, particularly as we enter the winter months when he typically gets in trouble with infections.   He recently received a letter in the mail reporting that [facility] will no longer be able to provide him with his Revlimid due to a change in insurance.  [facility] is informed of this and she will research specialty pharmacy coverage for his next Revlimid Rx.  Hematologically, he denies any complaints and ROS questioning is negative.    Past Medical History  Diagnosis Date  . Hypertension   . Heart disease   . Kidney stones     history  . Lung mass   . Heart murmur   . 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 Coronary artery disease   . Depression   . Shortness of breath   . Peripheral arterial disease   . Bladder neck contracture   . Personal history of other diseases of circulatory system   . Aortic aneurysm of unspecified site without mention of rupture   . Arthritis   . Intestinovesical fistula   . Esophageal reflux   .  Hyperlipidemia   . Anemia   . CHF (congestive heart failure)   . COPD (chronic obstructive pulmonary disease)   . Myocardial infarction   . Cerebral atherosclerosis     Carotid Doppler, 02/16/2013 - Bilateral Proximal ICAs,demonstrate mild plaque w/o evidence of significant  diameter reduction, dissection, or any other vascular abnormality  . Complication of anesthesia   . PONV (postoperative nausea and vomiting)   . Stroke 2013    Speech.  . Cancer   . Prostate cancer 2000  . Multiple myeloma   . Hx of bladder cancer 10/07/2012  . Non Hodgkin's lymphoma     has LYMPHOMA; MULTIPLE  MYELOMA; ANXIETY; HYPERTENSION; MYOCARDIAL INFARCTION; CAD, dating to 1996 with multiple PCIs, Stents to RCA and LCX, Last cath 2007 patent stents,30% prox LAD lesion, EF 45%; ASTHMA, UNSPECIFIED; NEPHROLITHIASIS; ELEVATED PROSTATE SPECIFIC ANTIGEN; Nonspecific (abnormal) findings on radiological and other examination of body structure; ROTATOR CUFF REPAIR, RIGHT, HX OF; Hypogammaglobulinemia; Cerebral embolism with cerebral infarction; H/O cardiac pacemaker, Medtronic REVO, MRI conditional device, placed 07/2011 for sympyomatic bradycardia; Hx of bladder cancer; Hyperlipidemia LDL goal < 100; Prediabetes; Expressive aphasia; Hemiplegia affecting right dominant side; Aneurysm of iliac artery; Shortness of breath; Rotator cuff tear arthropathy of right shoulder; Left rotator cuff tear arthropathy; Muscle weakness (generalized); Status post arthroscopy of shoulder; Pain in joint, shoulder region; Fever; Pneumonia; and Pancytopenia on his problem list.     is allergic to diphenhydramine hcl and morphine and related.  [facility] does not currently have medications on file.  Past Surgical History  Procedure Laterality Date  . Prostate surgery    . Heart stents x 5  1999  . Portacath placement  07/26/2009  . Wrist surgery      right  . Left ear skin cancer removed    . Bone marrow transplant  2011  . Pacemaker insertion  07/22/2011    Medtronic  . Coronary angioplasty  06/24/2000    PCI and stenting in mid & proximal RCA  . Insert / replace / remove pacemaker    . Tee without cardioversion  10/13/2012    Procedure: TRANSESOPHAGEAL ECHOCARDIOGRAM (TEE);  Surgeon: Thurmon Fair, MD;   Location[facility] ENDOSCOPY;  Service: Cardiovascular;  Laterality: N/A;  pat/kay/echo notified  . Colonoscopy N/A 01/01/2013    Procedure: COLONOSCOPY;  Surgeon: Malissa Hippo, MD;  Location: [facility] ENDO SUITE;  Service: Endoscopy;  Laterality: N/A;  825-moved to 940 Ann notified pt  . Rotator    . Rotator cuff repair Right   . Colon surgery      colon resection  . Bladder surgery    . Shoulder arthroscopy with subacromial decompression Right 07/21/2013    Procedure: RIGHT SHOULDER ARTHROSCOPY WITH SUBACROMIAL DECOMPRESSION AND DEBRIDEMENT & Injection of Left Shoulder;  Surgeon: Harvie Junior, MD;  Location: [facility] OR;  Service: Orthopedics;  Laterality: Right;  . US echocardiography  06/19/2011    RV mildly dilated,mild to mod. MR,mild AI,mild PI  . Nm myocar perf wall motion  11/27/2007    inferior scar    Denies any headaches, dizziness, double vision, fevers, chills, night sweats, nausea, vomiting, diarrhea, constipation, chest pain, heart palpitations, shortness of breath, blood in stool, black tarry stool, urinary pain, urinary burning, urinary frequency, hematuria.   PHYSICAL EXAMINATION  ECOG PERFORMANCE STATUS: 1 - Symptomatic but completely ambulatory  Filed Vitals:   10/01/13 0927  BP: 108/60  Pulse: 56  Temp: 97.7 F (36.5 C)  Resp: 18    GENERAL:alert, no  distress, well nourished, well developed, comfortable, cooperative and smiling SKIN: skin color, texture, turgor are normal, no rashes or significant lesions HEAD: Normocephalic, No masses, lesions, tenderness or abnormalities EYES: normal, PERRLA, EOMI, Conjunctiva are pink and non-injected EARS: External ears normal OROPHARYNX:mucous membranes are moist  NECK: supple, no adenopathy, thyroid normal size, non-tender, without nodularity, no stridor, non-tender, trachea midline LYMPH:  no palpable lymphadenopathy BREAST:not examined LUNGS: clear to auscultation  HEART: regular rate & rhythm, no murmurs, no gallops, S1 normal and  S2 normal ABDOMEN:abdomen soft, non-tender and normal bowel sounds BACK: Back symmetric, no curvature. EXTREMITIES:less then 2 second capillary refill, no joint deformities, effusion, or inflammation, no skin discoloration  NEURO: alert & oriented x 3 with fluent speech, no focal motor/sensory deficits, gait normal   LABORATORY DATA: CBC    Component Value Date/Time   WBC 3.9* 09/14/2013 0556   RBC 3.68* 09/14/2013 0556   HGB 11.0* 09/14/2013 0556   HCT 33.0* 09/14/2013 0556   PLT 109* 09/14/2013 0556   MCV 89.7 09/14/2013 0556   MCH 29.9 09/14/2013 0556   MCHC 33.3 09/14/2013 0556   RDW 16.4* 09/14/2013 0556   LYMPHSABS 1.0 09/13/2013 1241   MONOABS 0.5 09/13/2013 1241   EOSABS 0.1 09/13/2013 1241   BASOSABS 0.0 09/13/2013 1241      Chemistry      Component Value Date/Time   NA 138 09/14/2013 0556   K 3.4* 09/14/2013 0556   CL 104 09/14/2013 0556   CO2 23 09/14/2013 0556   BUN 10 09/14/2013 0556   CREATININE 0.92 09/14/2013 0556   CREATININE 0.77 04/02/2013 1025      Component Value Date/Time   CALCIUM 9.0 09/14/2013 0556   ALKPHOS 70 09/06/2013 1053   AST 20 09/06/2013 1053   ALT 14 09/06/2013 1053   BILITOT 0.5 09/06/2013 1053       RADIOGRAPHIC STUDIES:  09/13/2013  CLINICAL DATA: Cough and fever.  EXAM:  CHEST 2 VIEW  COMPARISON: 09/10/2013  FINDINGS:  Right-sided Port-A-Cath is in place, tip overlying the level of the  superior vena cava. Left-sided transvenous pacemaker leads overlying  the right atrium and right ventricle.  The heart is enlarged. There are no focal consolidations or pleural  effusions. Mild perihilar bronchitic changes are present. Minimal  left base atelectasis or scarring is noted.  IMPRESSION:  1. Cardiomegaly without pulmonary edema.  2. Left lower lobe atelectasis or scarring.  Electronically Signed  By: Rosalie Gums M.D.  On: 09/13/2013 14:30    ASSESSMENT:  1. IgG lambda multiple myeloma status post 4 cycles of dexamethasone and Velcade  followed by bone marrow transplantation by Dr. Greggory [facility] at [facility] October 2011. Now on maintenance Revlimid which he is tolerating well.  2. Non-Hodgkin's lymphoma, stage IVb, low-grade B-cell type, symptomatic at presentation in July 2002 to 6 cycles of R. CHOP with a complete response and PET scan in June 2012 showed no evidence for recurrent disease  3. History of probable bronchopneumonia with productive cough discolored yellow phlegm, low-grade fever today, coughing  4. Hypogammaglobulinemia  5. 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 CAD status post stent placement x5 in the past  7. Colovesical fistula repair years ago  8. Skin cancer on the right ear status post surgery by Dr. Park Liter  9. Recent CVA of left middle cerebral artery, causing expressive aphasia and right hemiparesis. Both are significantly improved.  10. Osteoporosis on therapy with zoledronic acid calcium and vitamin D 11. Hypogammaglobulinemia, will  try low-dose IVIG every 4 weeks  Patient Active Problem List   Diagnosis Date Noted  . Fever 09/13/2013  . Pneumonia 09/13/2013  . Pancytopenia 09/13/2013  . Muscle weakness (generalized) 08/04/2013  . Status post arthroscopy of shoulder 08/04/2013  . Pain in joint, shoulder region 08/04/2013  . Rotator cuff tear arthropathy of right shoulder 07/21/2013  . Left rotator cuff tear arthropathy 07/21/2013  . Shortness of breath 07/07/2013  . 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 Hyperlipidemia LDL goal < 100 10/08/2012  . Prediabetes 10/08/2012  . Expressive aphasia 10/08/2012  . Hemiplegia affecting right dominant side 10/08/2012  . 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 Hx of bladder cancer 10/07/2012  . Cerebral embolism with cerebral infarction 10/06/2012  . 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 ASTHMA, UNSPECIFIED 03/22/2010  . Nonspecific  (abnormal) findings on radiological and other examination of body structure 03/22/2010  . LYMPHOMA 03/21/2010  . MULTIPLE  MYELOMA 03/21/2010  . ANXIETY 03/21/2010  . HYPERTENSION 03/21/2010  . MYOCARDIAL INFARCTION 03/21/2010  . CAD, dating to 1996 with multiple PCIs, Stents to RCA and LCX, Last cath 2007 patent stents,30% prox LAD lesion, EF 45% 03/21/2010  . NEPHROLITHIASIS 03/21/2010  . ELEVATED PROSTATE SPECIFIC ANTIGEN 03/21/2010  . ROTATOR CUFF REPAIR, RIGHT, HX OF 03/21/2010     PLAN:  1. I personally reviewed and went over laboratory results with the patient. 2. I personally reviewed and went over radiographic studies with the patient. 3. Patient education regarding hand hygiene 4. Zometa as scheduled on 10/06/2013 and every 4 weeks thereafter 5. Labs every 4 weeks: CBC diff, CMET, LDH, MM panel 6. IVIG every 4 weeks supportive therapy plan built.  400 mg/kg every 4 weeks.  7. Copy of recent mailing given to [employer].  This paper reports that [facility] will no longer be able to provide the patient's revlimid due to insurance change.  8. Return in 3 months for follow-up    THERAPY PLAN:  He is doing well with maintenance Revlimid.  We will continue and see him back with labs.  Additionally, we will place him on an maintenance regimen of IVIG.  All questions were answered. The patient knows to call the clinic with any problems, questions or concerns. We can certainly see the patient much sooner if necessary.  Patient and plan discussed with Dr. Alla German and he is in agreement with the aforementioned.   KEFALAS,THOMAS

## 2013-09-30 NOTE — Progress Notes (Signed)
Occupational Therapy Treatment Patient Details  Name: Jeremy Parrish MRN: [id number] Date of Birth: March 02, 1941  Today's Date: 09/30/2013 Time: 0930-1017 OT Time Calculation (min): 47 min Manual 0930-0950 (20') TherExercises 0950-1017 (27')  Visit#: 11 of 20  Re-eval: 10/27/13    Authorization: Medicare  Authorization Time Period: before 19th visit  Authorization Visit#: 11 of 19  Subjective Symptoms/Limitations Symptoms: S:  I dont know what i have done to irritate this arm but it hurts today (referring to left arm) Pain Assessment Currently in Pain?: Yes Pain Score: 4  Pain Location: Shoulder Pain Orientation: Right;Left Pain Type: Acute pain;Chronic pain Multiple Pain Sites: No  Exercise/Treatments Supine Horizontal ABduction: PROM;12 reps;AROM;10 reps (bar weight ) Horizontal ABduction Weight (lbs): 1 External Rotation: PROM;12 reps;AROM;10 reps (bar weight) External Rotation Weight (lbs): 1 Internal Rotation: PROM;12 reps;AROM;10 reps (bar weight) Internal Rotation Weight (lbs): 1 Flexion: PROM;12 reps;AROM;10 reps Shoulder Flexion Weight (lbs): 1 ABduction: PROM;12 reps;AROM;10 reps (bar weight) Shoulder ABduction Weight (lbs): 1 Seated Extension: AROM;10 reps Row: AROM;10 reps Horizontal ABduction: AROM;10 reps External Rotation: AROM;10 reps Internal Rotation: AROM;10 reps Flexion: AROM;10 reps Abduction: AROM;10 reps Pulleys Flexion: 2 minutes ABduction: 2 minutes Therapy Ball   ROM / Strengthening / Isometric Strengthening UBE (Upper Arm Bike): 3' forward and 3' reverse at 1.0 resistance Wall Wash: 2' (with both arms.) Proximal Shoulder Strengthening, Supine: 1'          Occupational Therapy Assessment and Plan OT Assessment and Plan Clinical Impression Statement: A: Patient wtih increased complaint of pain in left shoulder this date more than right.  He tolerated supine exercises and UBE  this date with min complaint of increased fatigue.   demos near progress to when he was attending prior to hospitalization.   OT Plan: P: Complete FOTO.  Complete strengthening exercises independently with decreased c/o pain . UE activity tolerance to increased functional use in ADL tasks/activites.   Goals Short Term Goals Short Term Goal 3: Patient will decrease pain in his bilateral shoulders/arms to </= 2/10 when participting in daily activities.  Short Term Goal 3 Progress: Progressing toward goal Short Term Goal 4: Patient will have min-mod fascial restrictions in bilateral  arms.  Short Term Goal 4 Progress: Progressing toward goal Long Term Goals Long Term Goal 1: Patient will return to highest level of independence with all leisure and daily activities.  Long Term Goal 1 Progress: Progressing toward goal Long Term Goal 2: Patient will have 4+/5 strength Bilateral UE  for increased ability to participate in daily activities.  Long Term Goal 2 Progress: Progressing toward goal Long Term Goal 3: Patient will decrease pain in his bilateral arms to 1/10 when participting in daily activities. Long Term Goal 3 Progress: Progressing toward goal Long Term Goal 4: Patient will have trace-min fascial restrictions in bilateral  arms.  Long Term Goal 4 Progress: Progressing toward goal Long Term Goal 5: Patient will increase AROM to WNL in bilateral shoulder for increased independence with reaching into overhead cabinets.  Long Term Goal 5 Progress: Progressing toward goal  Problem List Patient Active Problem List   Diagnosis Date Noted  . Fever 09/13/2013  . Pneumonia 09/13/2013  . Pancytopenia 09/13/2013  . Muscle weakness (generalized) 08/04/2013  . Status post arthroscopy of shoulder 08/04/2013  . Pain in joint, shoulder region 08/04/2013  . Rotator cuff tear arthropathy of right shoulder 07/21/2013  . Left rotator cuff tear arthropathy 07/21/2013  . Shortness of breath 07/07/2013  . Aneurysm of iliac artery  01/26/2013  .  Hyperlipidemia LDL goal < 100 10/08/2012  . Prediabetes 10/08/2012  . Expressive aphasia 10/08/2012  . Hemiplegia affecting right dominant side 10/08/2012  . 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 Hx of bladder cancer 10/07/2012  . Cerebral embolism with cerebral infarction 10/06/2012  . 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 ASTHMA, UNSPECIFIED 03/22/2010  . Nonspecific (abnormal) findings on radiological and other examination of body structure 03/22/2010  . LYMPHOMA 03/21/2010  . MULTIPLE  MYELOMA 03/21/2010  . ANXIETY 03/21/2010  . HYPERTENSION 03/21/2010  . MYOCARDIAL INFARCTION 03/21/2010  . CAD, dating to 1996 with multiple PCIs, Stents to RCA and LCX, Last cath 2007 patent stents,30% prox LAD lesion, EF 45% 03/21/2010  . NEPHROLITHIASIS 03/21/2010  . ELEVATED PROSTATE SPECIFIC ANTIGEN 03/21/2010  . ROTATOR CUFF REPAIR, RIGHT, HX OF 03/21/2010    End of Session Activity Tolerance: Patient tolerated treatment well General Behavior During Therapy: [facility] for tasks assessed/performed  GO    Velora Mediate, OTR/L  09/30/2013, 1:48 PM

## 2013-10-01 ENCOUNTER — Encounter (HOSPITAL_COMMUNITY): Payer: Self-pay | Admitting: Oncology

## 2013-10-01 ENCOUNTER — Encounter (HOSPITAL_COMMUNITY): Payer: Medicare Other | Attending: Oncology | Admitting: Oncology

## 2013-10-01 VITALS — BP 108/60 | HR 56 | Temp 97.7°F | Resp 18 | Wt 188.6 lb

## 2013-10-01 DIAGNOSIS — Z87898 Personal history of other specified conditions: Secondary | ICD-10-CM | POA: Diagnosis not present

## 2013-10-01 DIAGNOSIS — C9000 Multiple myeloma not having achieved remission: Secondary | ICD-10-CM | POA: Diagnosis not present

## 2013-10-01 DIAGNOSIS — D801 Nonfamilial hypogammaglobulinemia: Secondary | ICD-10-CM | POA: Diagnosis not present

## 2013-10-01 DIAGNOSIS — C8589 Other specified types of non-Hodgkin lymphoma, extranodal and solid organ sites: Secondary | ICD-10-CM | POA: Diagnosis not present

## 2013-10-01 LAB — COMPREHENSIVE METABOLIC PANEL
ALT: 14 U/L (ref 0–53)
AST: 22 U/L (ref 0–37)
Albumin: 3.2 g/dL — ABNORMAL LOW (ref 3.5–5.2)
Alkaline Phosphatase: 74 U/L (ref 39–117)
Calcium: 9.3 mg/dL (ref 8.4–10.5)
Chloride: 103 mEq/L (ref 96–112)
Creatinine, Ser: 0.87 mg/dL (ref 0.50–1.35)
GFR calc non Af Amer: 85 mL/min — ABNORMAL LOW (ref >90)
Sodium: 138 mEq/L (ref 135–145)
Total Bilirubin: 0.3 mg/dL (ref 0.3–1.2)
Total Protein: 6.9 g/dL (ref 6.0–8.3)

## 2013-10-01 LAB — CBC WITH DIFFERENTIAL/PLATELET
Basophils Absolute: 0.1 10*3/uL (ref 0.0–0.1)
Basophils Relative: 2 % — ABNORMAL HIGH (ref 0–1)
Eosinophils Absolute: 0.2 10*3/uL (ref 0.0–0.7)
Eosinophils Relative: 6 % — ABNORMAL HIGH (ref 0–5)
HCT: 37.5 % — ABNORMAL LOW (ref 39.0–52.0)
MCH: 28.6 pg (ref 26.0–34.0)
MCHC: 32 g/dL (ref 30.0–36.0)
MCV: 89.3 fL (ref 78.0–100.0)
Monocytes Absolute: 0.3 10*3/uL (ref 0.1–1.0)
Platelets: 157 10*3/uL (ref 150–400)
RDW: 16.3 % — ABNORMAL HIGH (ref 11.5–15.5)
WBC: 4 10*3/uL (ref 4.0–10.5)

## 2013-10-01 NOTE — Patient Instructions (Signed)
[facility] Discharge Instructions  RECOMMENDATIONS MADE BY THE CONSULTANT AND ANY TEST RESULTS WILL BE SENT TO YOUR REFERRING PHYSICIAN.  EXAM FINDINGS BY THE PHYSICIAN TODAY AND SIGNS OR SYMPTOMS TO REPORT TO CLINIC OR PRIMARY PHYSICIAN: Exam and findings as discussed by Dellis Anes, PA-C.  Will check your blood work today and will start every 4 week IVIG and will continue zometa every 4 weeks.  Report fevers, chills, night sweats, etc.  MEDICATIONS PRESCRIBED:  none  INSTRUCTIONS/FOLLOW-UP: Blood work today and every 4 weeks, IVIG and zometa every 4 weeks and follow-up in 3 months.  Thank you for choosing [facility] to provide your oncology and hematology care.  To afford each patient quality time with our providers, please arrive at least 15 minutes before your scheduled appointment time.  With your help, our goal is to use those 15 minutes to complete the necessary work-up to ensure our physicians have the information they need to help with your evaluation and healthcare recommendations.    Effective January 1st, 2014, we ask that you re-schedule your appointment with our physicians should you arrive 10 or more minutes late for your appointment.  We strive to give you quality time with our providers, and arriving late affects you and other patients whose appointments are after yours.    Again, thank you for choosing [facility].  Our hope is that these requests will decrease the amount of time that you wait before being seen by our physicians.       _____________________________________________________________  Should you have questions after your visit to [facility], please contact our office at [phone] between the hours of 8:30 a.m. and 5:00 p.m.  Voicemails left after 4:30 p.m. will not be returned until the following business day.  For prescription refill requests, have your pharmacy contact our office with your  prescription refill request.

## 2013-10-04 ENCOUNTER — Other Ambulatory Visit (HOSPITAL_COMMUNITY): Payer: Medicare Other

## 2013-10-04 LAB — KAPPA/LAMBDA LIGHT CHAINS
Kappa free light chain: 2.56 mg/dL — ABNORMAL HIGH (ref 0.33–1.94)
Kappa, lambda light chain ratio: 0.86 (ref 0.26–1.65)
Lambda free light chains: 2.97 mg/dL — ABNORMAL HIGH (ref 0.57–2.63)

## 2013-10-05 ENCOUNTER — Other Ambulatory Visit (HOSPITAL_COMMUNITY): Payer: Self-pay | Admitting: Oncology

## 2013-10-05 DIAGNOSIS — E876 Hypokalemia: Secondary | ICD-10-CM

## 2013-10-05 LAB — MULTIPLE MYELOMA PANEL, SERUM
Albumin ELP: 51.7 % — ABNORMAL LOW (ref 55.8–66.1)
Alpha-1-Globulin: 5.4 % — ABNORMAL HIGH (ref 2.9–4.9)
Alpha-2-Globulin: 11.4 % (ref 7.1–11.8)
Beta Globulin: 6.7 % (ref 4.7–7.2)
Gamma Globulin: 19.7 % — ABNORMAL HIGH (ref 11.1–18.8)
IgG (Immunoglobin G), Serum: 1230 mg/dL (ref 650–1600)
M-Spike, %: NOT DETECTED g/dL
Total Protein: 6.6 g/dL (ref 6.0–8.3)

## 2013-10-05 MED ORDER — POTASSIUM CHLORIDE CRYS ER 20 MEQ PO TBCR: 20.0000 meq | EXTENDED_RELEASE_TABLET | Freq: Three times a day (TID) | ORAL | Status: DC

## 2013-10-06 ENCOUNTER — Encounter (HOSPITAL_BASED_OUTPATIENT_CLINIC_OR_DEPARTMENT_OTHER): Payer: Medicare Other | Admitting: Oncology

## 2013-10-06 ENCOUNTER — Encounter (HOSPITAL_BASED_OUTPATIENT_CLINIC_OR_DEPARTMENT_OTHER): Payer: Medicare Other

## 2013-10-06 VITALS — BP 137/75 | HR 67 | Temp 97.5°F | Resp 18

## 2013-10-06 DIAGNOSIS — C9000 Multiple myeloma not having achieved remission: Secondary | ICD-10-CM

## 2013-10-06 DIAGNOSIS — C8589 Other specified types of non-Hodgkin lymphoma, extranodal and solid organ sites: Secondary | ICD-10-CM

## 2013-10-06 DIAGNOSIS — J029 Acute pharyngitis, unspecified: Secondary | ICD-10-CM | POA: Diagnosis not present

## 2013-10-06 DIAGNOSIS — D801 Nonfamilial hypogammaglobulinemia: Secondary | ICD-10-CM | POA: Diagnosis not present

## 2013-10-06 MED ORDER — IMMUNE GLOBULIN (HUMAN) 5 GM/100ML IV SOLN
25.0000 g | Freq: Once | INTRAVENOUS | Status: AC
  Administered 2013-10-06: 12:00:00 25 g via INTRAVENOUS
  Filled 2013-10-06: qty 100

## 2013-10-06 MED ORDER — SODIUM CHLORIDE 0.9 % IV SOLN
Freq: Once | INTRAVENOUS | Status: AC
  Administered 2013-10-06: 14:00:00 via INTRAVENOUS

## 2013-10-06 MED ORDER — HEPARIN SOD (PORK) LOCK FLUSH 100 UNIT/ML IV SOLN
500.0000 [IU] | Freq: Once | INTRAVENOUS | Status: AC | PRN
  Administered 2013-10-06: 500 [IU]
  Filled 2013-10-06: qty 5

## 2013-10-06 MED ORDER — IMMUNE GLOBULIN (HUMAN) 10 GM/200ML IV SOLN
10.0000 g | Freq: Once | INTRAVENOUS | Status: AC
  Administered 2013-10-06: 10 g via INTRAVENOUS
  Filled 2013-10-06: qty 200

## 2013-10-06 MED ORDER — ACETAMINOPHEN 325 MG PO TABS: 650.0000 mg | ORAL_TABLET | Freq: Four times a day (QID) | ORAL | Status: DC | PRN

## 2013-10-06 MED ORDER — IMMUNE GLOBULIN (HUMAN) 10 GM/100ML IV SOLN: 0.4000 g/kg | Freq: Once | INTRAVENOUS | Status: DC

## 2013-10-06 MED ORDER — IMMUNE GLOBULIN (HUMAN) 5 GM/100ML IV SOLN
400.0000 mg/kg | Freq: Once | INTRAVENOUS | Status: DC
  Filled 2013-10-06: qty 100

## 2013-10-06 MED ORDER — DEXTROSE 5 % IV SOLN
INTRAVENOUS | Status: DC
  Administered 2013-10-06: 10:00:00 via INTRAVENOUS

## 2013-10-06 MED ORDER — ZOLEDRONIC ACID 4 MG/5ML IV CONC
4.0000 mg | Freq: Once | INTRAVENOUS | Status: AC
  Administered 2013-10-06: 4 mg via INTRAVENOUS
  Filled 2013-10-06: qty 5

## 2013-10-06 MED ORDER — SODIUM CHLORIDE 0.9 % IJ SOLN
10.0000 mL | INTRAMUSCULAR | Status: DC | PRN
  Administered 2013-10-06: 10 mL

## 2013-10-06 NOTE — Progress Notes (Signed)
Jeremy Parrish is here for maintenance IVIG.  He asked the nurse if I can see him today.  Jeremy Parrish reports that he is having a sore throat and requests another Z-Pak.  He denies any fevers or chills.    I have provided him education regarding the overuse of antibiotics.  Antibiotics are very important during an active infection and we will use antibiotics when indicated.    He reports that he woke up today reporting a "scratchy throat."  He explains that it "just feels different."  Without any focal complaints, I do not think an antibiotic is indicated at this time with a lack of symptoms.   PE: Gen: Well-appearing. NAD Throat: No posterior pharynx erythema.  No halitosis. Neck: No lymphadenopathy Neuro: A and O x 3.  Speech affected from past stroke.  Assessment: 1. Sore throat 2. Hypogammaglobulinemia 3. MM  Plan: 1. Recommend OTC sore throat lozenges 2. Recommend rinsing and gargling with warm salt water 3. Recommend hot tea with honey 4. Patient education regarding antibiotics. 5. IVIG as scheduled today. 6. Update [place] on Monday 7. Return as scheduled.   Patient and plan discussed with Dr. Alla German and he is in agreement with the aforementioned.   More than 50% of the time spent with the patient was utilized for counseling and coordination of care.  Bibi Economos

## 2013-10-06 NOTE — Patient Instructions (Signed)
[facility] Discharge Instructions  RECOMMENDATIONS MADE BY THE CONSULTANT AND ANY TEST RESULTS WILL BE SENT TO YOUR REFERRING PHYSICIAN.  MEDICATIONS PRESCRIBED:  None  INSTRUCTIONS GIVEN AND DISCUSSED: Call Monday if sore throat is worse.  SPECIAL INSTRUCTIONS/FOLLOW-UP: Recommended OTC sore throat Lozenges.  Recommend rinsing mouth and gargling with warm salt water.  Recommend hot tea with honey as a sweetener.  Thank you for choosing [facility] to provide your oncology and hematology care.  To afford each patient quality time with our providers, please arrive at least 15 minutes before your scheduled appointment time.  With your help, our goal is to use those 15 minutes to complete the necessary work-up to ensure our physicians have the information they need to help with your evaluation and healthcare recommendations.    Effective January 1st, 2014, we ask that you re-schedule your appointment with our physicians should you arrive 10 or more minutes late for your appointment.  We strive to give you quality time with our providers, and arriving late affects you and other patients whose appointments are after yours.    Again, thank you for choosing [facility].  Our hope is that these requests will decrease the amount of time that you wait before being seen by our physicians.       _____________________________________________________________  Should you have questions after your visit to [facility], please contact our office at [phone] between the hours of 8:30 a.m. and 5:00 p.m.  Voicemails left after 4:30 p.m. will not be returned until the following business day.  For prescription refill requests, have your pharmacy contact our office with your prescription refill request.

## 2013-10-11 ENCOUNTER — Other Ambulatory Visit (HOSPITAL_COMMUNITY): Payer: Self-pay | Admitting: Oncology

## 2013-10-11 DIAGNOSIS — C9000 Multiple myeloma not having achieved remission: Secondary | ICD-10-CM

## 2013-10-11 MED ORDER — LENALIDOMIDE 10 MG PO CAPS: 10.0000 mg | ORAL_CAPSULE | Freq: Every day | ORAL | Status: DC

## 2013-10-12 ENCOUNTER — Ambulatory Visit (HOSPITAL_COMMUNITY)
Admission: RE | Admit: 2013-10-12 | Discharge: 2013-10-12 | Disposition: A | Discharge status: Home / Self Care | Payer: Medicare Other | Source: Ambulatory Visit | Attending: Family Medicine | Admitting: Family Medicine

## 2013-10-12 DIAGNOSIS — I10 Essential (primary) hypertension: Secondary | ICD-10-CM | POA: Insufficient documentation

## 2013-10-12 DIAGNOSIS — M6281 Muscle weakness (generalized): Secondary | ICD-10-CM | POA: Insufficient documentation

## 2013-10-12 DIAGNOSIS — M25519 Pain in unspecified shoulder: Secondary | ICD-10-CM | POA: Insufficient documentation

## 2013-10-12 DIAGNOSIS — IMO0001 Reserved for inherently not codable concepts without codable children: Secondary | ICD-10-CM | POA: Insufficient documentation

## 2013-10-12 NOTE — Progress Notes (Signed)
Occupational Therapy Treatment Patient Details  Name: Jeremy Parrish MRN: [id number] Date of Birth: 1941-05-13  Today's Date: 10/12/2013 Time: 1020-1108 OT Time Calculation (min): 48 min Manual 1020-1038 (18') TherExercises 1038-1108 (30')  Visit#: 12 of 20  Re-eval: 10/27/13    Authorization: Medicare  Authorization Time Period: before 19th visit  Authorization Visit#: 12 of 19  Subjective Symptoms/Limitations Symptoms: S:  they hurt... both of them . Pain Assessment Currently in Pain?: Yes Pain Score: 3  Pain Location: Shoulder Pain Orientation: Right;Left Pain Type: Chronic pain Multiple Pain Sites: No     Exercise/Treatments Supine Horizontal ABduction: PROM;10 reps;AAROM;15 reps Horizontal ABduction Weight (lbs): 2 External Rotation: PROM;10 reps;AAROM;15 reps External Rotation Weight (lbs): 2 Internal Rotation: PROM;10 reps;AAROM;15 reps Internal Rotation Weight (lbs): 2 Flexion: PROM;10 reps;AAROM;15 reps Shoulder Flexion Weight (lbs): 2 ABduction: PROM;10 reps;AAROM;15 reps Shoulder ABduction Weight (lbs): 2 Seated Row: AAROM;12 reps;Weights Row Weight (lbs): 2 Horizontal ABduction: AAROM;12 reps;Weights Horizontal ABduction Weight (lbs): 2 External Rotation: AAROM;12 reps;Weights External Rotation Weight (lbs): 2 Internal Rotation: AAROM;12 reps;Weights Internal Rotation Weight (lbs): 2 Flexion: AAROM;12 reps;Weights Flexion Weight (lbs): 2 Abduction: AAROM;12 reps;Weights ABduction Weight (lbs): 2  Standing External Rotation: Theraband;15 reps Theraband Level (Shoulder External Rotation): Level 2 (Red) Internal Rotation: Theraband;15 reps Theraband Level (Shoulder Internal Rotation): Level 2 (Red) Extension: Theraband;15 reps Theraband Level (Shoulder Extension): Level 2 (Red) Row: Theraband;15 reps Theraband Level (Shoulder Row): Level 2 (Red)   Therapy Ball Right/Left: 5 reps ROM / Strengthening / Isometric Strengthening UBE (Upper Arm  Bike): 3' forward and 3' reverse at 2.0 resistance Proximal Shoulder Strengthening, Supine: 1'        Manual Therapy Manual Therapy: Myofascial release Myofascial Release: MFR to right upper arm, trapezius, and scapularis region to decrease fascial restrictions and increase joint mobility in a pain free zone  Occupational Therapy Assessment and Plan OT Assessment and Plan Clinical Impression Statement: A: Patient with complaint of increased fatigue following tx this date. He demos good tolerance with weighted bar exercises this date.  Patient with improved AAROM with weighted dowel in sitting.  increased resistance on UBE with good tolerance.  OT Plan: P: Complete strengthening exercises independently with decreased c/o pain . UE activity tolerance to increased functional use in ADL tasks/activites.   Goals Short Term Goals Short Term Goal 1: Patient will be educated on HEP. Short Term Goal 1 Progress: Met Short Term Goal 2: Patient will have 4-/5 strength in his RUE for increased ability to participate in daily activities.  Short Term Goal 2 Progress: Met Short Term Goal 3: Patient will decrease pain in his bilateral shoulders/arms to </= 2/10 when participting in daily activities.  Short Term Goal 3 Progress: Progressing toward goal Short Term Goal 4: Patient will have min-mod fascial restrictions in bilateral  arms.  Short Term Goal 4 Progress: Progressing toward goal Long Term Goals Long Term Goal 1: Patient will return to highest level of independence with all leisure and daily activities.  Long Term Goal 1 Progress: Progressing toward goal Long Term Goal 2: Patient will have 4+/5 strength Bilateral UE  for increased ability to participate in daily activities.  Long Term Goal 3: Patient will decrease pain in his bilateral arms to 1/10 when participting in daily activities. Long Term Goal 3 Progress: Progressing toward goal Long Term Goal 4: Patient will have trace-min fascial  restrictions in bilateral  arms.  Long Term Goal 4 Progress: Progressing toward goal Long Term Goal 5: Patient will increase AROM to  WNL in bilateral shoulder for increased independence with reaching into overhead cabinets.  Long Term Goal 5 Progress: Progressing toward goal  Problem List Patient Active Problem List   Diagnosis Date Noted  . Fever 09/13/2013  . Pneumonia 09/13/2013  . Pancytopenia 09/13/2013  . Muscle weakness (generalized) 08/04/2013  . Status post arthroscopy of shoulder 08/04/2013  . Pain in joint, shoulder region 08/04/2013  . Rotator cuff tear arthropathy of right shoulder 07/21/2013  . Left rotator cuff tear arthropathy 07/21/2013  . Shortness of breath 07/07/2013  . 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 Hyperlipidemia LDL goal < 100 10/08/2012  . Prediabetes 10/08/2012  . Expressive aphasia 10/08/2012  . Hemiplegia affecting right dominant side 10/08/2012  . 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 Hx of bladder cancer 10/07/2012  . Cerebral embolism with cerebral infarction 10/06/2012  . 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 ASTHMA, UNSPECIFIED 03/22/2010  . Nonspecific (abnormal) findings on radiological and other examination of body structure 03/22/2010  . LYMPHOMA 03/21/2010  . MULTIPLE  MYELOMA 03/21/2010  . ANXIETY 03/21/2010  . HYPERTENSION 03/21/2010  . MYOCARDIAL INFARCTION 03/21/2010  . CAD, dating to 1996 with multiple PCIs, Stents to RCA and LCX, Last cath 2007 patent stents,30% prox LAD lesion, EF 45% 03/21/2010  . NEPHROLITHIASIS 03/21/2010  . ELEVATED PROSTATE SPECIFIC ANTIGEN 03/21/2010  . ROTATOR CUFF REPAIR, RIGHT, HX OF 03/21/2010    End of Session Activity Tolerance: Patient tolerated treatment well General Behavior During Therapy: [facility] for tasks assessed/performed  GO Functional Assessment Tool Used: FOTO 65/100  Velora Mediate, OTR/L  10/12/2013,  12:50 PM

## 2013-10-14 ENCOUNTER — Other Ambulatory Visit (HOSPITAL_COMMUNITY): Payer: Medicare Other

## 2013-10-14 ENCOUNTER — Ambulatory Visit (HOSPITAL_COMMUNITY)
Admission: RE | Admit: 2013-10-14 | Discharge: 2013-10-14 | Disposition: A | Discharge status: Home / Self Care | Payer: Medicare Other | Source: Ambulatory Visit | Attending: Family Medicine | Admitting: Family Medicine

## 2013-10-14 ENCOUNTER — Other Ambulatory Visit (HOSPITAL_COMMUNITY): Payer: Self-pay | Admitting: Oncology

## 2013-10-14 DIAGNOSIS — C8589 Other specified types of non-Hodgkin lymphoma, extranodal and solid organ sites: Secondary | ICD-10-CM

## 2013-10-14 DIAGNOSIS — M12812 Other specific arthropathies, not elsewhere classified, left shoulder: Secondary | ICD-10-CM

## 2013-10-14 DIAGNOSIS — M75101 Unspecified rotator cuff tear or rupture of right shoulder, not specified as traumatic: Secondary | ICD-10-CM

## 2013-10-14 DIAGNOSIS — M75102 Unspecified rotator cuff tear or rupture of left shoulder, not specified as traumatic: Secondary | ICD-10-CM

## 2013-10-14 DIAGNOSIS — C9000 Multiple myeloma not having achieved remission: Secondary | ICD-10-CM

## 2013-10-14 DIAGNOSIS — M12811 Other specific arthropathies, not elsewhere classified, right shoulder: Secondary | ICD-10-CM

## 2013-10-14 MED ORDER — OXYCODONE-ACETAMINOPHEN 5-325 MG PO TABS: 1.0000 | ORAL_TABLET | Freq: Four times a day (QID) | ORAL | Status: DC | PRN

## 2013-10-14 NOTE — Progress Notes (Signed)
Occupational Therapy Treatment Patient Details  Name: Jeremy Parrish MRN: [id number] Date of Birth: 04-01-1941  Today's Date: 10/14/2013 Time: 1021-1107 OT Time Calculation (min): 46 min Manual 1021-1033 (12') TherExercises 1033-1107 (34')  Visit#: 13 of 20  Re-eval: 10/27/13    Authorization: Medicare  Authorization Time Period: before 19th visit  Authorization Visit#: 13 of 19  Subjective Symptoms/Limitations Symptoms: S:  I wasnt looking forward to coming today after the other day.   Pain Assessment Pain Score: 2  Pain Location: Shoulder Pain Orientation: Right;Left Pain Type: Acute pain Multiple Pain Sites: No  Exercise/Treatments Supine Horizontal ABduction: AAROM;15 reps Horizontal ABduction Weight (lbs): 2 External Rotation: AAROM;15 reps External Rotation Weight (lbs): 2 Internal Rotation: AAROM;15 reps Internal Rotation Weight (lbs): 2 Flexion: AAROM;15 reps Shoulder Flexion Weight (lbs): 2 ABduction: AAROM;15 reps Shoulder ABduction Weight (lbs): 2 Seated Row: AAROM;12 reps;Weights Row Weight (lbs): 2 Horizontal ABduction: AAROM;12 reps;Weights Horizontal ABduction Weight (lbs): 2 External Rotation: AAROM;12 reps;Weights External Rotation Weight (lbs): 2 Internal Rotation: AAROM;12 reps;Weights Internal Rotation Weight (lbs): 2 Flexion: AAROM;12 reps;Weights Flexion Weight (lbs): 2 Abduction: AAROM;12 reps;Weights ABduction Weight (lbs): 2 Other Seated Exercises: over head press with 2# bar x 12 reps  Other Seated Exercises: power for seated reverse pull down x 12 reps     ROM / Strengthening / Isometric Strengthening Cybex Press: 2 plate ([id number] ) Cybex Row: 2 plate (12 reps ) Over Head Lace: 4:20' (with 2 breaks ) Proximal Shoulder Strengthening, Supine: 1' Other ROM/Strengthening Exercises: NuStep level 3 x         Manual Therapy Manual Therapy: Myofascial release Myofascial Release: MFR to right upper arm, trapezius, and  scapularis region to decrease fascial restrictions and increase joint mobility in a pain free zone  Occupational Therapy Assessment and Plan OT Assessment and Plan Clinical Impression Statement: A:  Patient completed Cybex press/rows this date with no complaints;  increased fatigue with overhead lacing this date however with no increasd complaint of pain.  Increased seated exercises this date with use of 2# bar weight.  Good tolerance with NuStep.  OT Plan: P: Complete strengthening exercises independently with decreased c/o pain . UE activity tolerance to increased functional use in ADL tasks/activites.   Goals Short Term Goals Short Term Goal 1: Patient will be educated on HEP. Short Term Goal 1 Progress: Met Short Term Goal 2: Patient will have 4-/5 strength in his RUE for increased ability to participate in daily activities.  Short Term Goal 2 Progress: Met Short Term Goal 3: Patient will decrease pain in his bilateral shoulders/arms to </= 2/10 when participting in daily activities.  Short Term Goal 3 Progress: Progressing toward goal Short Term Goal 4: Patient will have min-mod fascial restrictions in bilateral  arms.  Short Term Goal 4 Progress: Progressing toward goal Long Term Goals Long Term Goal 1: Patient will return to highest level of independence with all leisure and daily activities.  Long Term Goal 1 Progress: Progressing toward goal Long Term Goal 2: Patient will have 4+/5 strength Bilateral UE  for increased ability to participate in daily activities.  Long Term Goal 2 Progress: Progressing toward goal Long Term Goal 3: Patient will decrease pain in his bilateral arms to 1/10 when participting in daily activities. Long Term Goal 3 Progress: Progressing toward goal Long Term Goal 4: Patient will have trace-min fascial restrictions in bilateral  arms.  Long Term Goal 4 Progress: Progressing toward goal Long Term Goal 5: Patient will increase AROM to  WNL in bilateral  shoulder for increased independence with reaching into overhead cabinets.  Long Term Goal 5 Progress: Progressing toward goal  Problem List Patient Active Problem List   Diagnosis Date Noted  . Fever 09/13/2013  . Pneumonia 09/13/2013  . Pancytopenia 09/13/2013  . Muscle weakness (generalized) 08/04/2013  . Status post arthroscopy of shoulder 08/04/2013  . Pain in joint, shoulder region 08/04/2013  . Rotator cuff tear arthropathy of right shoulder 07/21/2013  . Left rotator cuff tear arthropathy 07/21/2013  . Shortness of breath 07/07/2013  . 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 Hyperlipidemia LDL goal < 100 10/08/2012  . Prediabetes 10/08/2012  . Expressive aphasia 10/08/2012  . Hemiplegia affecting right dominant side 10/08/2012  . 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 Hx of bladder cancer 10/07/2012  . Cerebral embolism with cerebral infarction 10/06/2012  . 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 ASTHMA, UNSPECIFIED 03/22/2010  . Nonspecific (abnormal) findings on radiological and other examination of body structure 03/22/2010  . LYMPHOMA 03/21/2010  . MULTIPLE  MYELOMA 03/21/2010  . ANXIETY 03/21/2010  . HYPERTENSION 03/21/2010  . MYOCARDIAL INFARCTION 03/21/2010  . CAD, dating to 1996 with multiple PCIs, Stents to RCA and LCX, Last cath 2007 patent stents,30% prox LAD lesion, EF 45% 03/21/2010  . NEPHROLITHIASIS 03/21/2010  . ELEVATED PROSTATE SPECIFIC ANTIGEN 03/21/2010  . ROTATOR CUFF REPAIR, RIGHT, HX OF 03/21/2010    End of Session Activity Tolerance: Patient tolerated treatment well General Behavior During Therapy: [facility] for tasks assessed/performed  GO    Velora Mediate, OTR/L  10/14/2013, 11:24 AM

## 2013-10-15 ENCOUNTER — Other Ambulatory Visit: Payer: Self-pay | Admitting: Cardiovascular Disease

## 2013-10-15 ENCOUNTER — Other Ambulatory Visit (HOSPITAL_COMMUNITY): Payer: Medicare Other

## 2013-10-15 NOTE — Telephone Encounter (Signed)
Rx was sent to pharmacy electronically. 

## 2013-10-19 ENCOUNTER — Ambulatory Visit (HOSPITAL_COMMUNITY)
Admission: RE | Admit: 2013-10-19 | Discharge: 2013-10-19 | Disposition: A | Discharge status: Home / Self Care | Payer: Medicare Other | Source: Ambulatory Visit | Attending: Family Medicine | Admitting: Family Medicine

## 2013-10-19 NOTE — Progress Notes (Signed)
Occupational Therapy Treatment Patient Details  Name: Jeremy Parrish MRN: [id number] Date of Birth: 1941/09/16  Today's Date: 10/19/2013 Time: 4098-1191 OT Time Calculation (min): 49 min Manual 4782-9562 (14') TherExercises 0950-1025 (35')   Visit#: 14 of 20  Re-eval: 10/27/13    Authorization: Medicare  Authorization Time Period: before 19th visit  Authorization Visit#: 14 of 19  Subjective Symptoms/Limitations Symptoms: S: I have not been taking my pain meds ... I am trying to come off of them but i am hurting bad.   Pain Assessment Currently in Pain?: Yes Pain Score: 6  Pain Location: Shoulder Pain Orientation: Right;Left Pain Type: Acute pain Multiple Pain Sites: No   Exercise/Treatments Supine Protraction: AAROM;15 reps;Weights Protraction Weight (lbs): 2 Horizontal ABduction: AAROM;15 reps Horizontal ABduction Weight (lbs): 2 External Rotation: AAROM;15 reps External Rotation Weight (lbs): 2 Internal Rotation: AAROM;15 reps Internal Rotation Weight (lbs): 2 Flexion: AAROM;15 reps Shoulder Flexion Weight (lbs): 2 ABduction: AAROM;15 reps Shoulder ABduction Weight (lbs): 2 Seated Retraction: AROM;15 reps Other Seated Exercises: over head press with 2# bar x 12 reps  Standing External Rotation: AAROM;12 reps;Weights;Theraband;15 reps Theraband Level (Shoulder External Rotation): Level 2 (Red) External Rotation Weight (lbs): 2 Internal Rotation: AAROM;12 reps;Weights;Theraband;15 reps Theraband Level (Shoulder Internal Rotation): Level 2 (Red) Internal Rotation Weight (lbs): 2 Flexion: AAROM;12 reps;Weights;Limitations Shoulder Flexion Weight (lbs): 2 Flexion Limitations: to 90 degrees with pa from OTR to maintain positioning ABduction: AAROM;Both;12 reps;Weights;Limitations Shoulder ABduction Weight (lbs): 2 ABduction Limitations: to 90 degrees each side  Extension: AAROM;12 reps;Weights;Theraband;15 reps Theraband Level (Shoulder Extension): Level 2  (Red) Extension Weight (lbs): 2 Row: AAROM;12 reps;Weights;Theraband;15 reps Theraband Level (Shoulder Row): Level 2 (Red) Row Weight (lbs): 2   ROM / Strengthening / Isometric Strengthening UBE (Upper Arm Bike): 3' forward and 3' reverse at 3.0 resistance Cybex Press: 2 plate;15 reps Cybex Row: 2 plate;15 reps Proximal Shoulder Strengthening, Supine: 1' Ball on Wall: 1' Other ROM/Strengthening Exercises: finger walk up ladder holes to top, remove from ladder and hold 10" x 5 reps each arm.       Manual Therapy Manual Therapy: Myofascial release Myofascial Release: MFR to right upper arm, trapezius, and scapularis region to decrease fascial restrictions and increase joint mobility in a pain free zone Weight Bearing Technique Weight Bearing Technique: No  Occupational Therapy Assessment and Plan OT Assessment and Plan Clinical Impression Statement: A: Initiated finger walk on ladder holes with hold in flexed position with good- tolerance this date. instructed patient to work on wall walk at home for increased strengthening, activity endurance.  Patient required mod cues for posture during standing weighted exercises against wall this date and therapist assistance for flexion  OT Plan: P: Complete strengthening exercises independently with decreased c/o pain . UE activity tolerance to increased functional use in ADL tasks/activites.   Goals Short Term Goals Short Term Goal 1: Patient will be educated on HEP. Short Term Goal 1 Progress: Met Short Term Goal 2: Patient will have 4-/5 strength in his RUE for increased ability to participate in daily activities.  Short Term Goal 2 Progress: Met Short Term Goal 3: Patient will decrease pain in his bilateral shoulders/arms to </= 2/10 when participting in daily activities.  Short Term Goal 3 Progress: Progressing toward goal Short Term Goal 4: Patient will have min-mod fascial restrictions in bilateral  arms.  Short Term Goal 4 Progress:  Progressing toward goal Long Term Goals Long Term Goal 1: Patient will return to highest level of independence with all leisure and  daily activities.  Long Term Goal 1 Progress: Progressing toward goal Long Term Goal 2: Patient will have 4+/5 strength Bilateral UE  for increased ability to participate in daily activities.  Long Term Goal 2 Progress: Progressing toward goal Long Term Goal 3: Patient will decrease pain in his bilateral arms to 1/10 when participting in daily activities. Long Term Goal 3 Progress: Progressing toward goal Long Term Goal 4: Patient will have trace-min fascial restrictions in bilateral  arms.  Long Term Goal 4 Progress: Progressing toward goal Long Term Goal 5: Patient will increase AROM to WNL in bilateral shoulder for increased independence with reaching into overhead cabinets.  Long Term Goal 5 Progress: Progressing toward goal  Problem List Patient Active Problem List   Diagnosis Date Noted  . Fever 09/13/2013  . Pneumonia 09/13/2013  . Pancytopenia 09/13/2013  . Muscle weakness (generalized) 08/04/2013  . Status post arthroscopy of shoulder 08/04/2013  . Pain in joint, shoulder region 08/04/2013  . Rotator cuff tear arthropathy of right shoulder 07/21/2013  . Left rotator cuff tear arthropathy 07/21/2013  . Shortness of breath 07/07/2013  . 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 Hyperlipidemia LDL goal < 100 10/08/2012  . Prediabetes 10/08/2012  . Expressive aphasia 10/08/2012  . Hemiplegia affecting right dominant side 10/08/2012  . 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 Hx of bladder cancer 10/07/2012  . Cerebral embolism with cerebral infarction 10/06/2012  . 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 ASTHMA, UNSPECIFIED 03/22/2010  . Nonspecific (abnormal) findings on radiological and other examination of body structure 03/22/2010  . LYMPHOMA 03/21/2010  . MULTIPLE   MYELOMA 03/21/2010  . ANXIETY 03/21/2010  . HYPERTENSION 03/21/2010  . MYOCARDIAL INFARCTION 03/21/2010  . CAD, dating to 1996 with multiple PCIs, Stents to RCA and LCX, Last cath 2007 patent stents,30% prox LAD lesion, EF 45% 03/21/2010  . NEPHROLITHIASIS 03/21/2010  . ELEVATED PROSTATE SPECIFIC ANTIGEN 03/21/2010  . ROTATOR CUFF REPAIR, RIGHT, HX OF 03/21/2010    End of Session Activity Tolerance: Patient tolerated treatment well General Behavior During Therapy: [facility] for tasks assessed/performed  GO    Velora Mediate, OTR/L  10/19/2013, 10:40 AM

## 2013-10-21 ENCOUNTER — Ambulatory Visit (HOSPITAL_COMMUNITY)
Admission: RE | Admit: 2013-10-21 | Discharge: 2013-10-21 | Disposition: A | Discharge status: Home / Self Care | Payer: Medicare Other | Source: Ambulatory Visit | Attending: Family Medicine | Admitting: Family Medicine

## 2013-10-21 NOTE — Progress Notes (Signed)
Occupational Therapy Treatment Patient Details  Name: Jeremy Parrish MRN: [id number] Date of Birth: 1941-07-22  Today's Date: 10/21/2013 Time: 0930-1016 OT Time Calculation (min): 46 min Manual [id number] (16') therExercises 3086-5784 (30')  Visit#: 15 of 20  Re-eval:      Authorization: Medicare  Authorization Time Period: before 19th visit  Authorization Visit#: 15 of 19  Subjective Symptoms/Limitations Symptoms: S:  I have been doing a second set of some of my exercises.   Pain Assessment Currently in Pain?: Yes Pain Score: 4  Pain Location: Shoulder Pain Orientation: Right;Left Pain Type: Acute pain Multiple Pain Sites: No   Exercise/Treatments Supine Protraction: AAROM;15 reps;Weights Protraction Weight (lbs): 3 Horizontal ABduction: AAROM;15 reps Horizontal ABduction Weight (lbs): 3 External Rotation: AAROM;15 reps External Rotation Weight (lbs): 3 Internal Rotation: AAROM;15 reps Internal Rotation Weight (lbs): 3 Flexion: AAROM;15 reps Shoulder Flexion Weight (lbs): 3 ABduction: AAROM;15 reps Shoulder ABduction Weight (lbs): 3 SStanding External Rotation: AROM;12 reps;Weights (standing back against wall ) External Rotation Weight (lbs): 2 (and weight ) Internal Rotation: AROM;12 reps;Weights Internal Rotation Weight (lbs): 2 (hand weight ) Flexion: AROM;12 reps Flexion Limitations:  (to 90 degrees with vc for maintaining elbow extension ) ABduction: AROM;12 reps ABduction Limitations: to 90 degrees with vc for maintaining elbow extension  Extension: AROM;12 reps;Weights Extension Weight (lbs): 2 (hand weights) Row: AROM;12 reps;Weights Row Weight (lbs): 2 (hand weights) Other Standing Exercises: overhead press with 2# hand weights with min difficulty    ROM / Strengthening / Isometric Strengthening UBE (Upper Arm Bike): 3' forward and 3' reverse at 3.0 resistance (increasing UE endurance/strengthening for functional use) Cybex Press: 2 plate;15  reps Cybex Row: 2 plate;15 reps Over Head Lace: 5' with one rest break         Manual Therapy Manual Therapy: Myofascial release Myofascial Release: MFR to right upper arm, trapezius, and scapularis region to decrease fascial restrictions and increase joint mobility in a pain free zone Weight Bearing Technique Weight Bearing Technique: No  Occupational Therapy Assessment and Plan OT Assessment and Plan Clinical Impression Statement: A:  Patient demos increased overhead time with BUE this date with complaint of soreness and fatigue at end of session.  Patient demos increased ability for AROM exercises in standing without physical assistance.  OT Plan: P:  increase AROM exercises in standing and overhead activities for incrased strengthening and endurance to improve functional use of BUE in daily activities    Goals Short Term Goals Short Term Goal 1: Patient will be educated on HEP. Short Term Goal 1 Progress: Met Short Term Goal 2: Patient will have 4-/5 strength in his RUE for increased ability to participate in daily activities.  Short Term Goal 2 Progress: Met Short Term Goal 3: Patient will decrease pain in his bilateral shoulders/arms to </= 2/10 when participting in daily activities.  Short Term Goal 3 Progress: Progressing toward goal Short Term Goal 4: Patient will have min-mod fascial restrictions in bilateral  arms.  Short Term Goal 4 Progress: Progressing toward goal Long Term Goals Long Term Goal 1: Patient will return to highest level of independence with all leisure and daily activities.  Long Term Goal 1 Progress: Progressing toward goal Long Term Goal 2: Patient will have 4+/5 strength Bilateral UE  for increased ability to participate in daily activities.  Long Term Goal 2 Progress: Progressing toward goal Long Term Goal 3: Patient will decrease pain in his bilateral arms to 1/10 when participting in daily activities. Long Term Goal 3 Progress:  Progressing toward  goal Long Term Goal 4: Patient will have trace-min fascial restrictions in bilateral  arms.  Long Term Goal 4 Progress: Progressing toward goal Long Term Goal 5: Patient will increase AROM to WNL in bilateral shoulder for increased independence with reaching into overhead cabinets.  Long Term Goal 5 Progress: Progressing toward goal  Problem List Patient Active Problem List   Diagnosis Date Noted  . Fever 09/13/2013  . Pneumonia 09/13/2013  . Pancytopenia 09/13/2013  . Muscle weakness (generalized) 08/04/2013  . Status post arthroscopy of shoulder 08/04/2013  . Pain in joint, shoulder region 08/04/2013  . Rotator cuff tear arthropathy of right shoulder 07/21/2013  . Left rotator cuff tear arthropathy 07/21/2013  . Shortness of breath 07/07/2013  . 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 Hyperlipidemia LDL goal < 100 10/08/2012  . Prediabetes 10/08/2012  . Expressive aphasia 10/08/2012  . Hemiplegia affecting right dominant side 10/08/2012  . 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 Hx of bladder cancer 10/07/2012  . Cerebral embolism with cerebral infarction 10/06/2012  . 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 ASTHMA, UNSPECIFIED 03/22/2010  . Nonspecific (abnormal) findings on radiological and other examination of body structure 03/22/2010  . LYMPHOMA 03/21/2010  . MULTIPLE  MYELOMA 03/21/2010  . ANXIETY 03/21/2010  . HYPERTENSION 03/21/2010  . MYOCARDIAL INFARCTION 03/21/2010  . CAD, dating to 1996 with multiple PCIs, Stents to RCA and LCX, Last cath 2007 patent stents,30% prox LAD lesion, EF 45% 03/21/2010  . NEPHROLITHIASIS 03/21/2010  . ELEVATED PROSTATE SPECIFIC ANTIGEN 03/21/2010  . ROTATOR CUFF REPAIR, RIGHT, HX OF 03/21/2010    End of Session Activity Tolerance: Patient tolerated treatment well General Behavior During Therapy: [facility] for tasks assessed/performed  GO    Velora Mediate, OTR/L   10/21/2013, 10:20 AM

## 2013-10-26 ENCOUNTER — Other Ambulatory Visit (HOSPITAL_COMMUNITY): Payer: Self-pay | Admitting: Oncology

## 2013-10-26 ENCOUNTER — Ambulatory Visit (HOSPITAL_COMMUNITY)
Admission: RE | Admit: 2013-10-26 | Discharge: 2013-10-26 | Disposition: A | Discharge status: Home / Self Care | Payer: Medicare Other | Source: Ambulatory Visit | Attending: Family Medicine | Admitting: Family Medicine

## 2013-10-26 DIAGNOSIS — G629 Polyneuropathy, unspecified: Secondary | ICD-10-CM

## 2013-10-26 MED ORDER — GABAPENTIN 600 MG PO TABS: 600.0000 mg | ORAL_TABLET | Freq: Three times a day (TID) | ORAL | Status: DC

## 2013-10-26 NOTE — Progress Notes (Signed)
Occupational Therapy Treatment Patient Details  Name: Jeremy Parrish MRN: [id number] Date of Birth: [birth year]  Today's Date: 10/26/2013 Time: 1191-4782 OT Time Calculation (min): 37 min Manual therapy 9562-1308 13' Therapeutic exercises 1204-1228 24'  Visit#: 16 of 20  Re-eval: 10/27/13    Authorization: Medicare  Authorization Time Period: before 19th visit  Authorization Visit#: 16 of 19  Subjective S:  I think it s helping and I still need to work on reaching across my body and overhead.  Pain Assessment Currently in Pain?: Yes Pain Score: 2  Pain Location: Shoulder Pain Orientation: Right;Left Pain Type: Acute pain  Precautions/Restrictions   progress as tolerated  Exercise/Treatments Supine Protraction: AROM;15 reps Horizontal ABduction: AROM;15 reps External Rotation: AROM;15 reps Internal Rotation: AROM;15 reps Flexion: AROM;15 reps ABduction: AROM;15 reps Seated Protraction: AROM;10 reps External Rotation: AROM;10 reps Internal Rotation: AROM;10 reps Flexion: AROM;10 reps Abduction: AROM;10 reps Standing Extension: Theraband;15 reps Theraband Level (Shoulder Extension): Level 2 (Red) Row: Theraband;15 reps Theraband Level (Shoulder Row): Level 3 (Green) Retraction: Theraband;15 reps Theraband Level (Shoulder Retraction): Level 3 (Green) ROM / Strengthening / Isometric Strengthening UBE (Upper Arm Bike): 3' and 3' 3.5  Cybex Press: 2 plate;15 reps Cybex Row: 2 plate;15 reps "W" Arms: standing in front of mirror  X to V Arms: 10 X standing in front of mirror  Proximal Shoulder Strengthening, Supine: 10 X each without resting       Manual Therapy Manual Therapy: Myofascial release Myofascial Release: MFR to bilaterat upper arm, trapezius, and scapularis region to decrease fascial restrictions and increase joint mobility in a pain free zone  MFR to cervical, SCM region to decrease pain and restrictions.   Occupational Therapy Assessment and  Plan OT Assessment and Plan Clinical Impression Statement: A:  Able to complete WFL AROM in seated and supine, grinding noted in right shoulder withtband exercises.  OT Plan: P:  Reassess for monthly progress note.    Goals Short Term Goals Short Term Goal 1: Patient will be educated on HEP. Short Term Goal 2: Patient will have 4-/5 strength in his RUE for increased ability to participate in daily activities.  Short Term Goal 3: Patient will decrease pain in his bilateral shoulders/arms to </= 2/10 when participting in daily activities.  Short Term Goal 4: Patient will have min-mod fascial restrictions in bilateral  arms.  Long Term Goals Long Term Goal 1: Patient will return to highest level of independence with all leisure and daily activities.  Long Term Goal 2: Patient will have 4+/5 strength Bilateral UE  for increased ability to participate in daily activities.  Long Term Goal 3: Patient will decrease pain in his bilateral arms to 1/10 when participting in daily activities. Long Term Goal 4: Patient will have trace-min fascial restrictions in bilateral  arms.  Long Term Goal 5: Patient will increase AROM to WNL in bilateral shoulder for increased independence with reaching into overhead cabinets.   Problem List Patient Active Problem List   Diagnosis Date Noted  . Fever 09/13/2013  . Pneumonia 09/13/2013  . Pancytopenia 09/13/2013  . Muscle weakness (generalized) 08/04/2013  . Status post arthroscopy of shoulder 08/04/2013  . Pain in joint, shoulder region 08/04/2013  . Rotator cuff tear arthropathy of right shoulder 07/21/2013  . Left rotator cuff tear arthropathy 07/21/2013  . Shortness of breath 07/07/2013  . 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 Hyperlipidemia LDL goal < 100 10/08/2012  . Prediabetes 10/08/2012  . Expressive aphasia 10/08/2012  . Hemiplegia affecting  right dominant side 10/08/2012  . H/O cardiac pacemaker, Medtronic REVO, MRI conditional device, placed  07/2011 for sympyomatic bradycardia 10/07/2012  . Hx of bladder cancer 10/07/2012  . Cerebral embolism with cerebral infarction 10/06/2012  . 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 ASTHMA, UNSPECIFIED 03/22/2010  . Nonspecific (abnormal) findings on radiological and other examination of body structure 03/22/2010  . LYMPHOMA 03/21/2010  . MULTIPLE  MYELOMA 03/21/2010  . ANXIETY 03/21/2010  . HYPERTENSION 03/21/2010  . MYOCARDIAL INFARCTION 03/21/2010  . CAD, dating to 1996 with multiple PCIs, Stents to RCA and LCX, Last cath 2007 patent stents,30% prox LAD lesion, EF 45% 03/21/2010  . NEPHROLITHIASIS 03/21/2010  . ELEVATED PROSTATE SPECIFIC ANTIGEN 03/21/2010  . ROTATOR CUFF REPAIR, RIGHT, HX OF 03/21/2010    End of Session Activity Tolerance: Patient tolerated treatment well General Behavior During Therapy: [facility] for tasks assessed/performed  GO    Shirlean Mylar, OTR/L  10/26/2013, 12:25 PM

## 2013-10-26 NOTE — Addendum Note (Signed)
Encounter addended by: Velora Mediate, OT on: 10/26/2013  7:57 AM<BR>     Documentation filed: Flowsheet VN, Clinical Notes

## 2013-10-27 ENCOUNTER — Other Ambulatory Visit (HOSPITAL_COMMUNITY): Payer: Self-pay | Admitting: Oncology

## 2013-10-27 DIAGNOSIS — G629 Polyneuropathy, unspecified: Secondary | ICD-10-CM

## 2013-10-27 MED ORDER — GABAPENTIN 600 MG PO TABS: 600.0000 mg | ORAL_TABLET | Freq: Every day | ORAL | Status: DC

## 2013-10-28 ENCOUNTER — Ambulatory Visit (HOSPITAL_COMMUNITY)
Admission: RE | Admit: 2013-10-28 | Discharge: 2013-10-28 | Disposition: A | Discharge status: Home / Self Care | Payer: Medicare Other | Source: Ambulatory Visit | Attending: Occupational Therapy | Admitting: Occupational Therapy

## 2013-10-28 NOTE — Evaluation (Signed)
Occupational Therapy Discharge  Patient Details  Name: Jeremy Parrish MRN: [id number] Date of Birth: 12/13/1940  Today's Date: 10/28/2013 Time: 0935-1020 OT Time Calculation (min): 45 min Manual 4098-1191 (12') Reassessment 0947-1000 (13') TherExercises 1000-1020 (20')  Visit#: 17 of 20  Re-eval:    Assessment Diagnosis: s/p Right shoulder arthroscopic debridement of cuff tear;  Left shoulder rotator cuff tear  Surgical Date: 07/21/13 Prior Therapy: recently receiving therapy on the right shoulder now with orders for left shoulder   Authorization: Medicare  Authorization Time Period:    Authorization Visit#: 17 of     Past Medical History:  Past Medical History  Diagnosis Date  . Hypertension   . Heart disease   . Kidney stones     history  . Lung mass   . Heart murmur   . 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 Coronary artery disease   . Depression   . Shortness of breath   . Peripheral arterial disease   . Bladder neck contracture   . Personal history of other diseases of circulatory system   . Aortic aneurysm of unspecified site without mention of rupture   . Arthritis   . Intestinovesical fistula   . Esophageal reflux   . Hyperlipidemia   . Anemia   . CHF (congestive heart failure)   . COPD (chronic obstructive pulmonary disease)   . Myocardial infarction   . Cerebral atherosclerosis     Carotid Doppler, 02/16/2013 - Bilateral Proximal ICAs,demonstrate mild plaque w/o evidence of significant diameter reduction, dissection, or any other vascular abnormality  . Complication of anesthesia   . PONV (postoperative nausea and vomiting)   . Stroke 2013    Speech.  . Cancer   . Prostate cancer 2000  . Multiple myeloma   . Hx of bladder cancer 10/07/2012  . Non Hodgkin's lymphoma    Past Surgical History:  Past Surgical History  Procedure Laterality Date  . Prostate surgery    . Heart stents x 5   1999  . Portacath placement  07/26/2009  . Wrist surgery      right  . Left ear skin cancer removed    . Bone marrow transplant  2011  . Pacemaker insertion  07/22/2011    Medtronic  . Coronary angioplasty  06/24/2000    PCI and stenting in mid & proximal RCA  . Insert / replace / remove pacemaker    . Tee without cardioversion  10/13/2012    Procedure: TRANSESOPHAGEAL ECHOCARDIOGRAM (TEE);  Surgeon: Thurmon Fair, MD;  Location: [facility] ENDOSCOPY;  Service: Cardiovascular;  Laterality: N/A;  pat/kay/echo notified  . Colonoscopy N/A 01/01/2013    Procedure: COLONOSCOPY;  Surgeon: Malissa Hippo, MD;  Location: [facility] ENDO SUITE;  Service: Endoscopy;  Laterality: N/A;  825-moved to [address] notified pt  . Rotator    . Rotator cuff repair Right   . Colon surgery      colon resection  . Bladder surgery    . Shoulder arthroscopy with subacromial decompression Right 07/21/2013    Procedure: RIGHT SHOULDER ARTHROSCOPY WITH SUBACROMIAL DECOMPRESSION AND DEBRIDEMENT & Injection of Left Shoulder;  Surgeon: Harvie Junior, MD;  Location: [facility] OR;  Service: Orthopedics;  Laterality: Right;  . US echocardiography  06/19/2011    RV mildly dilated,mild to mod. MR,mild AI,mild PI  . Nm myocar perf wall motion  11/27/2007    inferior scar    Subjective Symptoms/Limitations Symptoms: S:  I think I need a day off but I am doing pretty good.  Pain Assessment Currently in Pain?: Yes Pain Score: 4  Pain Location: Shoulder Pain Orientation: Right;Left Pain Type: Acute pain Multiple Pain Sites: No  Precautions/Restrictions  Precautions Precautions: Shoulder Restrictions Weight Bearing Restrictions: No     Prior Functioning  Home Living Family/patient expects to be discharged to:: Private residence Living Arrangements: Spouse/significant other Prior Function Level of Independence: Independent with basic ADLs;Independent with gait Comments: Used to like to fish and hunt. Does not do these activities any  more. Enjoys attending [place] football games.  Assessment ADL/Vision/Perception ADL ADL Comments: Patient treports getting milk out of refrigerator with two hands.  also reports getting lighter items out with one hand  and returning with two.  Dominant Hand: Right Vision - History Baseline Vision: Wears glasses all the time  Cognition/Observation Cognition Overall Cognitive Status: Within Functional Limits for tasks assessed   Additional Assessments RUE AROM (degrees) Right Shoulder Flexion: 162 Degrees (148) Right Shoulder ABduction: 160 Degrees (154) Right Shoulder Internal Rotation: 80 Degrees (68) Right Shoulder External Rotation: 82 Degrees (80) RUE Strength Right Shoulder Flexion:  (4-/5 (4-/5)) Right Shoulder ABduction:  (4-/5 (4-/5)) Right Shoulder Internal Rotation: 4/5 (4/5) Right Shoulder External Rotation: 3+/5 (3+/5) LUE AROM (degrees) Left Shoulder Flexion: 152 Degrees (136) Left Shoulder ABduction: 158 Degrees (139) Left Shoulder Internal Rotation: 88 Degrees (73) Left Shoulder External Rotation: 70 Degrees (69) LUE Strength Left Shoulder Flexion:  (4-/5 (4-/5)) Left Shoulder ABduction:  (4-/5 (3+/5)) Left Shoulder Internal Rotation:  (4-/5 (3+/5)) Left Shoulder External Rotation: 3+/5 (3+/5 ) Palpation Palpation: trace fascial restrictions in bilateral upper arms, trap and scapular regions.      Exercise/Treatments Supine Protraction: AROM;15 reps Horizontal ABduction: AROM;15 reps External Rotation: AROM;15 reps Internal Rotation: AROM;15 reps Flexion: AROM;15 reps ABduction: AROM;15 reps ROM / Strengthening / Isometric Strengthening UBE (Upper Arm Bike): 3' and 3' 3.5  Cybex Press: 2 plate;15 reps Cybex Row: 2 plate;15 reps      Manual Therapy Manual Therapy: Myofascial release Myofascial Release: MFR to bilaterat upper arm, trapezius, and scapularis region to decrease fascial restrictions and increase joint mobility in a pain free zone  MFR to cervical, SCM region to decrease pain and restrictions Weight Bearing Technique Weight Bearing Technique: No  Occupational Therapy Assessment and Plan OT Assessment and Plan Clinical Impression Statement: A:  Reassessed this date. Patient demos increased ROM throughout BUE however continues with increased complaint of pain in shoulders L>R.  He reports increased functional use in daily tasks and improved overall strength.  3/4 STG met; 3/5 LTG met.  Recommend continue wtih daily HEP for maintaining range and continuing strengthening.  OT Plan: P: Discharge from skilled OT servcises at this time with HEPs    Goals Home Exercise Program PT Goal: Perform Home Exercise Program - Progress: Met Short Term Goals Short Term Goal 1: Patient will be educated on HEP. Short Term Goal 1 Progress: Met Short Term Goal 2: Patient will have 4-/5 strength in his RUE for increased ability to participate in daily activities.  Short Term Goal 2 Progress: Met Short Term Goal 3: Patient will decrease pain in his bilateral shoulders/arms to </= 2/10 when participting in daily activities.  Short Term Goal 3 Progress: Not met Short Term Goal 4: Patient will have min-mod fascial restrictions in bilateral  arms.  Short Term Goal 4 Progress: Met Long Term Goals Long Term Goal 1: Patient will return to highest level of independence  with all leisure and daily activities.  Long Term Goal 1 Progress: Met Long Term Goal 2: Patient will have 4+/5 strength Bilateral UE  for increased ability to participate in daily activities.  Long Term Goal 2 Progress: Not met Long Term Goal 3: Patient will decrease pain in his bilateral arms to 1/10 when participting in daily activities. Long Term Goal 3 Progress: Not met Long Term Goal 4: Patient will have trace-min fascial restrictions in bilateral  arms.  Long Term Goal 4 Progress: Met Long Term Goal 5: Patient will increase AROM to WNL in bilateral shoulder for increased  independence with reaching into overhead cabinets.  Long Term Goal 5 Progress: Met  Problem List Patient Active Problem List   Diagnosis Date Noted  . Fever 09/13/2013  . Pneumonia 09/13/2013  . Pancytopenia 09/13/2013  . Muscle weakness (generalized) 08/04/2013  . Status post arthroscopy of shoulder 08/04/2013  . Pain in joint, shoulder region 08/04/2013  . Rotator cuff tear arthropathy of right shoulder 07/21/2013  . Left rotator cuff tear arthropathy 07/21/2013  . Shortness of breath 07/07/2013  . 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 Hyperlipidemia LDL goal < 100 10/08/2012  . Prediabetes 10/08/2012  . Expressive aphasia 10/08/2012  . Hemiplegia affecting right dominant side 10/08/2012  . 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 Hx of bladder cancer 10/07/2012  . Cerebral embolism with cerebral infarction 10/06/2012  . 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 ASTHMA, UNSPECIFIED 03/22/2010  . Nonspecific (abnormal) findings on radiological and other examination of body structure 03/22/2010  . LYMPHOMA 03/21/2010  . MULTIPLE  MYELOMA 03/21/2010  . ANXIETY 03/21/2010  . HYPERTENSION 03/21/2010  . MYOCARDIAL INFARCTION 03/21/2010  . CAD, dating to 1996 with multiple PCIs, Stents to RCA and LCX, Last cath 2007 patent stents,30% prox LAD lesion, EF 45% 03/21/2010  . NEPHROLITHIASIS 03/21/2010  . ELEVATED PROSTATE SPECIFIC ANTIGEN 03/21/2010  . ROTATOR CUFF REPAIR, RIGHT, HX OF 03/21/2010    End of Session Activity Tolerance: Patient tolerated treatment well General Behavior During Therapy: WFL for tasks assessed/performed  GO Functional Assessment Tool Used: FOTO  55/100  (65/100). clinical observation  Functional Limitation: Carrying, moving and handling objects Carrying, Moving and Handling Objects Goal Status ([id number]): 0 percent impaired, limited or restricted Carrying, Moving and Handling Objects  Discharge Status [phone]): At least 1 percent but less than 20 percent impaired, limited or restricted  Velora Mediate, OTR/L  10/28/2013, 10:45 AM  Physician Documentation Your signature is required to indicate approval of the treatment plan as stated above.  Please sign and either send electronically or make a copy of this report for your files and return this physician signed original.  Please mark one 1.__approve of plan  2. ___approve of plan with the following conditions.   ______________________________                                                          _____________________ Physician Signature  Date  

## 2013-10-29 ENCOUNTER — Encounter (HOSPITAL_COMMUNITY): Payer: Medicare Other | Attending: Oncology

## 2013-10-29 DIAGNOSIS — C8589 Other specified types of non-Hodgkin lymphoma, extranodal and solid organ sites: Secondary | ICD-10-CM | POA: Diagnosis not present

## 2013-10-29 DIAGNOSIS — C9000 Multiple myeloma not having achieved remission: Secondary | ICD-10-CM | POA: Insufficient documentation

## 2013-10-29 LAB — COMPREHENSIVE METABOLIC PANEL
ALT: 14 U/L (ref 0–53)
AST: 22 U/L (ref 0–37)
Alkaline Phosphatase: 73 U/L (ref 39–117)
BUN: 9 mg/dL (ref 6–23)
CO2: 26 mEq/L (ref 19–32)
Calcium: 8.9 mg/dL (ref 8.4–10.5)
Chloride: 104 mEq/L (ref 96–112)
GFR calc Af Amer: >90 mL/min (ref >90)
GFR calc non Af Amer: 84 mL/min — ABNORMAL LOW (ref >90)
Glucose, Bld: 113 mg/dL — ABNORMAL HIGH (ref 70–99)
Potassium: 4.1 mEq/L (ref 3.5–5.1)
Sodium: 139 mEq/L (ref 135–145)
Total Bilirubin: 0.6 mg/dL (ref 0.3–1.2)

## 2013-10-29 LAB — CBC WITH DIFFERENTIAL/PLATELET
Basophils Absolute: 0.1 10*3/uL (ref 0.0–0.1)
Eosinophils Relative: 6 % — ABNORMAL HIGH (ref 0–5)
HCT: 38.2 % — ABNORMAL LOW (ref 39.0–52.0)
Lymphocytes Relative: 36 % (ref 12–46)
Lymphs Abs: 1.2 10*3/uL (ref 0.7–4.0)
MCV: 91 fL (ref 78.0–100.0)
Neutro Abs: 1.5 10*3/uL — ABNORMAL LOW (ref 1.7–7.7)
Platelets: 113 10*3/uL — ABNORMAL LOW (ref 150–400)
RBC: 4.2 MIL/uL — ABNORMAL LOW (ref 4.22–5.81)
WBC: 3.3 10*3/uL — ABNORMAL LOW (ref 4.0–10.5)

## 2013-10-29 LAB — LACTATE DEHYDROGENASE: LDH: 182 U/L (ref 94–250)

## 2013-10-29 NOTE — Progress Notes (Signed)
[name] presented for labwork. Labs per MD order drawn via Peripheral Line 23 gauge needle inserted in RT AC  Good blood return present. Procedure without incident.  Needle removed intact. Patient tolerated procedure well.

## 2013-11-01 ENCOUNTER — Telehealth: Payer: Self-pay | Admitting: Family Medicine

## 2013-11-01 LAB — KAPPA/LAMBDA LIGHT CHAINS
Kappa free light chain: 2.81 mg/dL — ABNORMAL HIGH (ref 0.33–1.94)
Kappa, lambda light chain ratio: 1.11 (ref 0.26–1.65)
Lambda free light chains: 2.53 mg/dL (ref 0.57–2.63)

## 2013-11-01 NOTE — Telephone Encounter (Signed)
Patient needs handicap form filled out

## 2013-11-02 ENCOUNTER — Encounter (HOSPITAL_BASED_OUTPATIENT_CLINIC_OR_DEPARTMENT_OTHER): Payer: Medicare Other

## 2013-11-02 VITALS — BP 149/73 | HR 67 | Temp 97.6°F | Resp 20

## 2013-11-02 DIAGNOSIS — C9000 Multiple myeloma not having achieved remission: Secondary | ICD-10-CM | POA: Diagnosis not present

## 2013-11-02 DIAGNOSIS — D801 Nonfamilial hypogammaglobulinemia: Secondary | ICD-10-CM

## 2013-11-02 DIAGNOSIS — C8589 Other specified types of non-Hodgkin lymphoma, extranodal and solid organ sites: Secondary | ICD-10-CM

## 2013-11-02 LAB — MULTIPLE MYELOMA PANEL, SERUM
Beta 2: 4.2 % (ref 3.2–6.5)
Beta Globulin: 7.3 % — ABNORMAL HIGH (ref 4.7–7.2)
Gamma Globulin: 15.2 % (ref 11.1–18.8)
IgA: 236 mg/dL (ref 68–379)
M-Spike, %: NOT DETECTED g/dL

## 2013-11-02 MED ORDER — SODIUM CHLORIDE 0.9 % IV SOLN
Freq: Once | INTRAVENOUS | Status: AC
  Administered 2013-11-02: 09:00:00 via INTRAVENOUS

## 2013-11-02 MED ORDER — SODIUM CHLORIDE 0.9 % IV SOLN: Freq: Once | INTRAVENOUS | Status: DC

## 2013-11-02 MED ORDER — HEPARIN SOD (PORK) LOCK FLUSH 100 UNIT/ML IV SOLN
500.0000 [IU] | Freq: Once | INTRAVENOUS | Status: AC | PRN
  Administered 2013-11-02: 500 [IU]

## 2013-11-02 MED ORDER — IMMUNE GLOBULIN (HUMAN) 5 GM/100ML IV SOLN
25.0000 g | Freq: Once | INTRAVENOUS | Status: AC
  Administered 2013-11-02: 25 g via INTRAVENOUS
  Filled 2013-11-02: qty 100

## 2013-11-02 MED ORDER — IMMUNE GLOBULIN (HUMAN) 10 GM/200ML IV SOLN
10.0000 g | Freq: Once | INTRAVENOUS | Status: AC
  Administered 2013-11-02: 10 g via INTRAVENOUS
  Filled 2013-11-02: qty 200

## 2013-11-02 MED ORDER — SODIUM CHLORIDE 0.9 % IJ SOLN
10.0000 mL | INTRAMUSCULAR | Status: DC | PRN
  Administered 2013-11-02: 10 mL

## 2013-11-02 MED ORDER — IMMUNE GLOBULIN (HUMAN) 10 GM/100ML IV SOLN: 0.4000 g/kg | Freq: Once | INTRAVENOUS | Status: DC

## 2013-11-02 MED ORDER — ZOLEDRONIC ACID 4 MG/5ML IV CONC
4.0000 mg | Freq: Once | INTRAVENOUS | Status: AC
  Administered 2013-11-02: 4 mg via INTRAVENOUS
  Filled 2013-11-02: qty 5

## 2013-11-02 MED ORDER — ACETAMINOPHEN 325 MG PO TABS
650.0000 mg | ORAL_TABLET | Freq: Four times a day (QID) | ORAL | Status: DC | PRN
  Administered 2013-11-02: 650 mg via ORAL
  Filled 2013-11-02: qty 2

## 2013-11-02 MED ORDER — HEPARIN SOD (PORK) LOCK FLUSH 100 UNIT/ML IV SOLN
INTRAVENOUS | Status: AC
  Filled 2013-11-02: qty 5

## 2013-11-02 NOTE — Progress Notes (Signed)
Tolerated infusion well. 

## 2013-11-02 NOTE — Telephone Encounter (Signed)
Form completed. Patient notified.

## 2013-11-02 NOTE — Progress Notes (Signed)
Tolerated zometa 4 mg infusion and octagam 5% per protocol very well.  Home without c/o discomfort.

## 2013-11-08 ENCOUNTER — Other Ambulatory Visit (HOSPITAL_COMMUNITY): Payer: Self-pay | Admitting: Oncology

## 2013-11-08 DIAGNOSIS — C9000 Multiple myeloma not having achieved remission: Secondary | ICD-10-CM

## 2013-11-08 DIAGNOSIS — M75101 Unspecified rotator cuff tear or rupture of right shoulder, not specified as traumatic: Secondary | ICD-10-CM

## 2013-11-08 DIAGNOSIS — M12811 Other specific arthropathies, not elsewhere classified, right shoulder: Secondary | ICD-10-CM

## 2013-11-08 DIAGNOSIS — M12812 Other specific arthropathies, not elsewhere classified, left shoulder: Secondary | ICD-10-CM

## 2013-11-08 DIAGNOSIS — C8589 Other specified types of non-Hodgkin lymphoma, extranodal and solid organ sites: Secondary | ICD-10-CM

## 2013-11-08 DIAGNOSIS — M75102 Unspecified rotator cuff tear or rupture of left shoulder, not specified as traumatic: Secondary | ICD-10-CM

## 2013-11-08 MED ORDER — OXYCODONE-ACETAMINOPHEN 5-325 MG PO TABS: 1.0000 | ORAL_TABLET | Freq: Four times a day (QID) | ORAL | Status: DC | PRN

## 2013-11-09 ENCOUNTER — Encounter: Payer: Self-pay | Admitting: Oncology

## 2013-11-16 DIAGNOSIS — L820 Inflamed seborrheic keratosis: Secondary | ICD-10-CM | POA: Diagnosis not present

## 2013-11-16 DIAGNOSIS — Z85828 Personal history of other malignant neoplasm of skin: Secondary | ICD-10-CM | POA: Diagnosis not present

## 2013-11-16 DIAGNOSIS — L570 Actinic keratosis: Secondary | ICD-10-CM | POA: Diagnosis not present

## 2013-11-16 DIAGNOSIS — L821 Other seborrheic keratosis: Secondary | ICD-10-CM | POA: Diagnosis not present

## 2013-11-17 ENCOUNTER — Other Ambulatory Visit (HOSPITAL_COMMUNITY): Payer: Self-pay | Admitting: Oncology

## 2013-11-18 ENCOUNTER — Telehealth (HOSPITAL_COMMUNITY): Payer: Self-pay | Admitting: *Deleted

## 2013-11-19 ENCOUNTER — Ambulatory Visit (HOSPITAL_COMMUNITY): Payer: Medicare Other

## 2013-11-19 ENCOUNTER — Encounter (HOSPITAL_COMMUNITY): Payer: Self-pay | Admitting: Oncology

## 2013-11-19 ENCOUNTER — Encounter (HOSPITAL_COMMUNITY): Payer: Medicare Other | Attending: Oncology | Admitting: Oncology

## 2013-11-19 VITALS — BP 155/75 | HR 65 | Temp 97.5°F | Resp 20 | Wt 190.5 lb

## 2013-11-19 DIAGNOSIS — C8589 Other specified types of non-Hodgkin lymphoma, extranodal and solid organ sites: Secondary | ICD-10-CM

## 2013-11-19 DIAGNOSIS — M810 Age-related osteoporosis without current pathological fracture: Secondary | ICD-10-CM

## 2013-11-19 DIAGNOSIS — D801 Nonfamilial hypogammaglobulinemia: Secondary | ICD-10-CM

## 2013-11-19 DIAGNOSIS — M25519 Pain in unspecified shoulder: Secondary | ICD-10-CM

## 2013-11-19 DIAGNOSIS — C9000 Multiple myeloma not having achieved remission: Secondary | ICD-10-CM

## 2013-11-19 DIAGNOSIS — G4700 Insomnia, unspecified: Secondary | ICD-10-CM | POA: Diagnosis not present

## 2013-11-19 MED ORDER — TEMAZEPAM 15 MG PO CAPS: 15.0000 mg | ORAL_CAPSULE | Freq: Every evening | ORAL | Status: DC | PRN

## 2013-11-19 NOTE — Progress Notes (Signed)
Jeremy Battiest, MD [place] [address]  MULTIPLE  MYELOMA - Plan: CBC with Differential, Comprehensive metabolic panel, Lactate dehydrogenase, Beta 2 microglobuline, serum, C-reactive protein, Multiple myeloma panel, serum, Kappa/lambda light chains  Insomnia - Plan: ibuprofen (ADVIL,MOTRIN) 200 MG tablet, temazepam (RESTORIL) 15 MG capsule, DISCONTINUED: temazepam (RESTORIL) 15 MG capsule  LYMPHOMA - Plan: CBC with Differential, Comprehensive metabolic panel, Lactate dehydrogenase, Beta 2 microglobuline, serum, C-reactive protein, Multiple myeloma panel, serum, Kappa/lambda light chains  CURRENT THERAPY: Revlimid 10 mg 7 days on and 7 days off and Zometa every 4 weeks. Also IVIG low-dose every 4 weeks as well.   INTERVAL HISTORY: Jeremy Parrish 73 y.o. male returns for  regular  visit for followup of IgG lambda multiple myeloma status post 4 cycles of dexamethasone and Velcade followed by bone marrow transplantation by Jeremy Parrish at [facility] October 2011. Now on maintenance Revlimid which he is tolerating well.   Jeremy Parrish is doing well from an infection standpoint since we started him on low-dose IVIG every 4 weeks due to his hypogammaglobulinemia.  He has not required antibiotics in a few months.   I personally reviewed and went over laboratory results with the patient.  His lab results follow below and are stable.   Jeremy Parrish complains of B/L muscle discomfort on exertion.  This is no specific.  He denies joint aches.  He is on a statin which he has been on for a long time.  He denies any recent refills of this medication.  He remains on Revlimid which may be a culprit, but he is on Gabapentin to help with that.  I wound be resistant to changing multiple myeloma medications as his disease is extremely well controlled at this time.  This discomfort could be secondary to deconditioning as well.   He is using Ibuprofen to help with his shoulder pain.  His other complaint is  insomnia.  He admits that he has tried Tylenol PM without benefit.  He admits to 2 hours of sleep at night.  We will try 15-30 mg of Restoril at HS to help with sleep.  We will evaluate efficacy of this medication.  Hematologically, he denies any complaints and ROS questioning is negative.    Past Medical History  Diagnosis Date  . Hypertension   . Heart disease   . Kidney stones     history  . Lung mass   . Heart murmur   . 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 Coronary artery disease   . Depression   . Shortness of breath   . Peripheral arterial disease   . Bladder neck contracture   . Personal history of other diseases of circulatory system   . Aortic aneurysm of unspecified site without mention of rupture   . Arthritis   . Intestinovesical fistula   . Esophageal reflux   . Hyperlipidemia   . Anemia   . CHF (congestive heart failure)   . COPD (chronic obstructive pulmonary disease)   . Myocardial infarction   . Cerebral atherosclerosis     Carotid Doppler, 02/16/2013 - Bilateral Proximal ICAs,demonstrate mild plaque w/o evidence of significant diameter reduction, dissection, or any other vascular abnormality  . Complication of anesthesia   . PONV (postoperative nausea and vomiting)   . Stroke 2013    Speech.  . Cancer   . Prostate cancer 2000  . Multiple myeloma   . Hx  of bladder cancer 10/07/2012  . Non Hodgkin's lymphoma     has LYMPHOMA; MULTIPLE  MYELOMA; ANXIETY; HYPERTENSION; MYOCARDIAL INFARCTION; CAD, dating to 1996 with multiple PCIs, Stents to RCA and LCX, Last cath 2007 patent stents,30% prox LAD lesion, EF 45%; ASTHMA, UNSPECIFIED; NEPHROLITHIASIS; ELEVATED PROSTATE SPECIFIC ANTIGEN; Nonspecific (abnormal) findings on radiological and other examination of body structure; ROTATOR CUFF REPAIR, RIGHT, HX OF; Hypogammaglobulinemia; Cerebral embolism with cerebral infarction; H/O cardiac pacemaker,  Medtronic REVO, MRI conditional device, placed 07/2011 for sympyomatic bradycardia; Hx of bladder cancer; Hyperlipidemia LDL goal < 100; Prediabetes; Expressive aphasia; Hemiplegia affecting right dominant side; Aneurysm of iliac artery; Shortness of breath; Rotator cuff tear arthropathy of right shoulder; Left rotator cuff tear arthropathy; Muscle weakness (generalized); Status post arthroscopy of shoulder; Pain in joint, shoulder region; Fever; Pneumonia; and Pancytopenia on his problem list.     is allergic to diphenhydramine hcl and morphine and related.  Jeremy Parrish had no medications administered during this visit.  Past Surgical History  Procedure Laterality Date  . Prostate surgery    . Heart stents x 5  1999  . Portacath placement  07/26/2009  . Wrist surgery      right  . Left ear skin cancer removed    . Bone marrow transplant  2011  . Pacemaker insertion  07/22/2011    Medtronic  . Coronary angioplasty  06/24/2000    PCI and stenting in mid & proximal RCA  . Insert / replace / remove pacemaker    . Tee without cardioversion  10/13/2012    Procedure: TRANSESOPHAGEAL ECHOCARDIOGRAM (TEE);  Surgeon: Jeremy Klein, MD;  Location: [facility] ENDOSCOPY;  Service: Cardiovascular;  Laterality: N/A;  pat/kay/echo notified  . Colonoscopy N/A 01/01/2013    Procedure: COLONOSCOPY;  Surgeon: Jeremy Houston, MD;  Location: [facility] ENDO SUITE;  Service: Endoscopy;  Laterality: N/A;  825-moved to [place] notified pt  . Rotator    . Rotator cuff repair Right   . Colon surgery      colon resection  . Bladder surgery    . Shoulder arthroscopy with subacromial decompression Right 07/21/2013    Procedure: RIGHT SHOULDER ARTHROSCOPY WITH SUBACROMIAL DECOMPRESSION AND DEBRIDEMENT & Injection of Left Shoulder;  Surgeon: Jeremy Corning, MD;  Location: [place];  Service: Orthopedics;  Laterality: Right;  . US echocardiography  06/19/2011    RV mildly dilated,mild to mod. MR,mild AI,mild PI  . Nm myocar perf wall motion   11/27/2007    inferior scar    Denies any headaches, dizziness, double vision, fevers, chills, night sweats, nausea, vomiting, diarrhea, constipation, chest pain, heart palpitations, shortness of breath, blood in stool, black tarry stool, urinary pain, urinary burning, urinary frequency, hematuria.   PHYSICAL EXAMINATION  ECOG PERFORMANCE STATUS: 1 - Symptomatic but completely ambulatory  Filed Vitals:   11/19/13 1503  BP: 155/75  Pulse: 65  Temp: 97.5 F (36.4 C)  Resp: 20    GENERAL:alert, no distress, well nourished, well developed, comfortable, cooperative, obese and smiling SKIN: skin color, texture, turgor are normal, no rashes or significant lesions HEAD: Normocephalic, No masses, lesions, tenderness or abnormalities EYES: normal, PERRLA, EOMI, Conjunctiva are pink and non-injected EARS: External ears normal OROPHARYNX:mucous membranes are moist  NECK: supple, no adenopathy, thyroid normal size, non-tender, without nodularity, no stridor, non-tender, trachea midline LYMPH:  no palpable lymphadenopathy, no hepatosplenomegaly BREAST:not examined LUNGS: clear to auscultation and percussion HEART: regular rate & rhythm, no murmurs, no gallops, S1 normal and S2 normal ABDOMEN:abdomen soft,  non-tender, obese, normal bowel sounds, no masses or organomegaly and no hepatosplenomegaly BACK: Back symmetric, no curvature., No CVA tenderness EXTREMITIES:less then 2 second capillary refill, no joint deformities, effusion, or inflammation, no edema, no skin discoloration, no clubbing, no cyanosis  NEURO: alert & oriented x 3 with fluent speech, no focal motor/sensory deficits, gait normal   LABORATORY DATA: CBC    Component Value Date/Time   WBC 3.3* 10/29/2013 0910   RBC 4.20* 10/29/2013 0910   HGB 12.3* 10/29/2013 0910   HCT 38.2* 10/29/2013 0910   PLT 113* 10/29/2013 0910   MCV 91.0 10/29/2013 0910   MCH 29.3 10/29/2013 0910   MCHC 32.2 10/29/2013 0910   RDW 16.3* 10/29/2013  0910   LYMPHSABS 1.2 10/29/2013 0910   MONOABS 0.3 10/29/2013 0910   EOSABS 0.2 10/29/2013 0910   BASOSABS 0.1 10/29/2013 0910      Chemistry      Component Value Date/Time   NA 139 10/29/2013 0910   K 4.1 10/29/2013 0910   CL 104 10/29/2013 0910   CO2 26 10/29/2013 0910   BUN 9 10/29/2013 0910   CREATININE 0.88 10/29/2013 0910   CREATININE 0.77 04/02/2013 1025      Component Value Date/Time   CALCIUM 8.9 10/29/2013 0910   ALKPHOS 73 10/29/2013 0910   AST 22 10/29/2013 0910   ALT 14 10/29/2013 0910   BILITOT 0.6 10/29/2013 0910     Results for QUARON, DELACRUZ (MRN [id number]) as of 11/19/2013 15:51  Ref. Range 10/29/2013 09:10  Albumin ELP Latest Range: 55.8-66.1 % 56.5  Alpha-1-Globulin Latest Range: 2.9-4.9 % 6.2 (H)  Alpha-2-Globulin Latest Range: 7.1-11.8 % 10.6  Beta Globulin Latest Range: 4.7-7.2 % 7.3 (H)  Beta 2 Latest Range: 3.2-6.5 % 4.2  Gamma Globulin Latest Range: 11.1-18.8 % 15.2  M-SPIKE, % No range found NOT DETECTED  SPE Interp. No range found (NOTE)  Comment No range found (NOTE)  IgG (Immunoglobin G), Serum Latest Range: 319 602 0134 mg/dL 981  IgA Latest Range: 68-379 mg/dL 236  IgM, Serum Latest Range: 41-251 mg/dL 30 (L)  Kappa free light chain Latest Range: 0.33-1.94 mg/dL 2.81 (H)  Lamda free light chains Latest Range: 0.57-2.63 mg/dL 2.53  Kappa, lamda light chain ratio Latest Range: 0.26-1.65  1.11   Results for JAYDAN, MEIDINGER (MRN [id number]) as of 11/19/2013 15:51  Ref. Range 10/29/2013 09:10  LDH Latest Range: 94-250 U/L 182       ASSESSMENT: 1. IgG lambda multiple myeloma status post 4 cycles of dexamethasone and Velcade followed by bone marrow transplantation by Jeremy Parrish at [facility] October 2011. Now on maintenance Revlimid which he is tolerating well.  2. Non-Hodgkin's lymphoma, stage IVb, low-grade B-cell type, symptomatic at presentation in July 2002 to 6 cycles of R. CHOP with a complete response and PET scan in June 2012 showed no  evidence for recurrent disease  3. History of probable bronchopneumonia with productive cough discolored yellow phlegm, low-grade fever today, coughing  4. Hypogammaglobulinemia, on low-dose IVIG every 4 weeks with excellent response. 5. 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 CAD status post stent placement x5 in the past  7. Colovesical fistula repair years ago  81. Skin cancer on the right ear status post surgery by Dr. Link Snuffer  9. Recent CVA of left middle cerebral artery, causing expressive aphasia and right hemiparesis. Both are significantly improved.  10. Osteoporosis on therapy with zoledronic acid calcium and vitamin D  11. Insomina, will try Restoril 15-30  mg at HS 12. B/L LE myalgias with exertion, ?decondition, ?statin-induced, ?lenolidomide-induced  Patient Active Problem List   Diagnosis Date Noted  . Fever 09/13/2013  . Pneumonia 09/13/2013  . Pancytopenia 09/13/2013  . Muscle weakness (generalized) 08/04/2013  . Status post arthroscopy of shoulder 08/04/2013  . Pain in joint, shoulder region 08/04/2013  . Rotator cuff tear arthropathy of right shoulder 07/21/2013  . Left rotator cuff tear arthropathy 07/21/2013  . Shortness of breath 07/07/2013  . 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 Hyperlipidemia LDL goal < 100 10/08/2012  . Prediabetes 10/08/2012  . Expressive aphasia 10/08/2012  . Hemiplegia affecting right dominant side 10/08/2012  . 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 Hx of bladder cancer 10/07/2012  . Cerebral embolism with cerebral infarction 10/06/2012  . 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 ASTHMA, UNSPECIFIED 03/22/2010  . Nonspecific (abnormal) findings on radiological and other examination of body structure 03/22/2010  . LYMPHOMA 03/21/2010  . MULTIPLE  MYELOMA 03/21/2010  . ANXIETY 03/21/2010  . HYPERTENSION 03/21/2010  .  MYOCARDIAL INFARCTION 03/21/2010  . CAD, dating to 1996 with multiple PCIs, Stents to RCA and LCX, Last cath 2007 patent stents,30% prox LAD lesion, EF 45% 03/21/2010  . NEPHROLITHIASIS 03/21/2010  . ELEVATED PROSTATE SPECIFIC ANTIGEN 03/21/2010  . ROTATOR CUFF REPAIR, RIGHT, HX OF 03/21/2010    PLAN:  1. I personally reviewed and went over laboratory results with the patient. 2. Zometa as scheduled on 11/30/2013 and every 4 weeks thereafter 3. IVIG every 4 weeks and scheduled for 11/30/2013 4. Labs every 4 weeks: CBC diff, CMET, LDH, MM panel, CRP 5. Continue with maintenance Revlimid 6. Rx for Restoril 15-30 mg at HS #60 with 0 refills 7. Encouraged continue exercise 8. Return in 3 months for follow-up   THERAPY PLAN:  He is doing well with maintenance Revlimid.  He is receiving monthly Zometa.  Since starting low-dose IVIG supportive therapy plan, he has not required antibiotics and we will continue with this plan.   All questions were answered. The patient knows to call the clinic with any problems, questions or concerns. We can certainly see the patient much sooner if necessary.  Patient and plan discussed with Dr. Farrel Gobble and he is in agreement with the aforementioned.   Jolynne Spurgin

## 2013-11-19 NOTE — Patient Instructions (Signed)
.  [facility] Discharge Instructions  RECOMMENDATIONS MADE BY THE CONSULTANT AND ANY TEST RESULTS WILL BE SENT TO YOUR REFERRING PHYSICIAN.  EXAM FINDINGS BY THE PHYSICIAN TODAY AND SIGNS OR SYMPTOMS TO REPORT TO CLINIC OR PRIMARY PHYSICIAN:  MEDICATIONS PRESCRIBED:  Continue zometa and ivig monthly Labs monthly INSTRUCTIONS/FOLLOW-UP: 3 months  Thank you for choosing [facility] to provide your oncology and hematology care.  To afford each patient quality time with our providers, please arrive at least 15 minutes before your scheduled appointment time.  With your help, our goal is to use those 15 minutes to complete the necessary work-up to ensure our physicians have the information they need to help with your evaluation and healthcare recommendations.    Effective January 1st, 2014, we ask that you re-schedule your appointment with our physicians should you arrive 10 or more minutes late for your appointment.  We strive to give you quality time with our providers, and arriving late affects you and other patients whose appointments are after yours.    Again, thank you for choosing [facility].  Our hope is that these requests will decrease the amount of time that you wait before being seen by our physicians.       _____________________________________________________________  Should you have questions after your visit to [facility], please contact our office at [phone] between the hours of 8:30 a.m. and 5:00 p.m.  Voicemails left after 4:30 p.m. will not be returned until the following business day.  For prescription refill requests, have your pharmacy contact our office with your prescription refill request.

## 2013-11-23 ENCOUNTER — Other Ambulatory Visit (HOSPITAL_COMMUNITY): Payer: Self-pay | Admitting: *Deleted

## 2013-11-23 ENCOUNTER — Other Ambulatory Visit (HOSPITAL_COMMUNITY): Payer: Self-pay | Admitting: Cardiovascular Disease

## 2013-11-23 ENCOUNTER — Ambulatory Visit (HOSPITAL_COMMUNITY)
Admission: RE | Admit: 2013-11-23 | Discharge: 2013-11-23 | Disposition: A | Discharge status: Home / Self Care | Payer: Medicare Other | Source: Ambulatory Visit | Attending: Cardiovascular Disease | Admitting: Cardiovascular Disease

## 2013-11-23 DIAGNOSIS — I714 Abdominal aortic aneurysm, without rupture, unspecified: Secondary | ICD-10-CM

## 2013-11-23 NOTE — Progress Notes (Signed)
Abdominal Aortic Duplex Completed °Brianna L Mazza,RVT °

## 2013-11-24 ENCOUNTER — Other Ambulatory Visit (HOSPITAL_COMMUNITY): Payer: Self-pay | Admitting: Oncology

## 2013-11-24 DIAGNOSIS — C9000 Multiple myeloma not having achieved remission: Secondary | ICD-10-CM

## 2013-11-24 MED ORDER — LENALIDOMIDE 10 MG PO CAPS: 10.0000 mg | ORAL_CAPSULE | Freq: Every day | ORAL | Status: DC

## 2013-11-25 ENCOUNTER — Encounter: Payer: Self-pay | Admitting: Cardiology

## 2013-11-25 ENCOUNTER — Encounter (HOSPITAL_COMMUNITY): Payer: Self-pay | Admitting: *Deleted

## 2013-11-25 ENCOUNTER — Ambulatory Visit (INDEPENDENT_AMBULATORY_CARE_PROVIDER_SITE_OTHER): Payer: Medicare Other | Admitting: Cardiology

## 2013-11-25 VITALS — BP 130/82 | HR 70 | Ht 68.0 in | Wt 195.0 lb

## 2013-11-25 DIAGNOSIS — I6340 Cerebral infarction due to embolism of unspecified cerebral artery: Secondary | ICD-10-CM

## 2013-11-25 DIAGNOSIS — I739 Peripheral vascular disease, unspecified: Secondary | ICD-10-CM | POA: Diagnosis not present

## 2013-11-25 DIAGNOSIS — I2510 Atherosclerotic heart disease of native coronary artery without angina pectoris: Secondary | ICD-10-CM

## 2013-11-25 DIAGNOSIS — R079 Chest pain, unspecified: Secondary | ICD-10-CM | POA: Insufficient documentation

## 2013-11-25 DIAGNOSIS — Z8679 Personal history of other diseases of the circulatory system: Secondary | ICD-10-CM

## 2013-11-25 DIAGNOSIS — Z950 Presence of cardiac pacemaker: Secondary | ICD-10-CM

## 2013-11-25 DIAGNOSIS — C9000 Multiple myeloma not having achieved remission: Secondary | ICD-10-CM

## 2013-11-25 MED ORDER — ISOSORBIDE MONONITRATE ER 30 MG PO TB24: 30.0000 mg | ORAL_TABLET | Freq: Every day | ORAL | Status: DC

## 2013-11-25 NOTE — Patient Instructions (Signed)
  Your physician wants you to follow-up with extender Lurena Joiner) next week                      Your physician has recommended you make the following change in your medication: Start Imdur 30mg  daily   Your physician has ordered the following tests: lexiscan myoview (next week) and lower extremity doppler

## 2013-11-25 NOTE — Assessment & Plan Note (Signed)
He presented today with a history of SSCP with exertion, worrisome for new angina.

## 2013-11-25 NOTE — Assessment & Plan Note (Signed)
Aphasia improved from 2013

## 2013-11-25 NOTE — Assessment & Plan Note (Signed)
Last Myoview 2009

## 2013-11-25 NOTE — Progress Notes (Signed)
11/25/2013 Jeremy Parrish   1940/12/20  [id number]  Primary Physicia [name], MD Primary Cardiologist: Dr Sallyanne Kuster  HPI:  73 y/o with a history of CAD and CVA. He had RCA and CFX stenting in '96 with ISR-PCI CFX in '97. Last cath was '07 and last Myoview was '09. He has done well from a cardiac standpoint. He had a MDT pacemaker implanted in 2012. 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 Other medical problems include multiple myeloma, PVD with LCIA aneurysm,  And DJD, s/p rotator cuff repair Sept 2014.            He is in the office today with complaints of exertional chest pain hat is worrisome for new angina. He has had 3-4 episodes over the past 6 weeks associated with walking, relived with rest. He has not used NTG. He denies associated SOB, nausea, radiation, or diaphoresis.    Current Outpatient Prescriptions  Medication Sig Dispense Refill  . amLODipine (NORVASC) 10 MG tablet Take 10 mg by mouth daily.        Marland Kitchen aspirin 81 MG tablet Take 81 mg by mouth daily.      . citalopram (CELEXA) 40 MG tablet Take 40 mg by mouth daily.       . clopidogrel (PLAVIX) 75 MG tablet TAKE 1 TABLET BY MOUTH EVERY DAY  90 tablet  3  . gabapentin (NEURONTIN) 600 MG tablet Take 600 mg by mouth 3 (three) times daily.      Marland Kitchen ibuprofen (ADVIL,MOTRIN) 200 MG tablet Take 200 mg by mouth every 6 (six) hours as needed.      Marland Kitchen lenalidomide (REVLIMID) 10 MG capsule Take 1 capsule (10 mg total) by mouth daily. Take 1 capsule by mouth daily for 7 days on followed by 7 days off  14 capsule  0  . nitroGLYCERIN (NITROSTAT) 0.4 MG SL tablet Place 1 tablet (0.4 mg total) under the tongue every 5 (five) minutes as needed for chest pain.  25 tablet  12  . omeprazole (PRILOSEC) 20 MG capsule Take 1 capsule (20 mg total) by mouth daily.  90 capsule  1  . oxyCODONE-acetaminophen (PERCOCET/ROXICET) 5-325 MG per tablet Take 1 tablet by mouth every 6 (six) hours as  needed.  60 tablet  0  . potassium chloride SA (KLOR-CON M20) 20 MEQ tablet Take 1 tablet (20 mEq total) by mouth 3 (three) times daily.  90 tablet  1  . simvastatin (ZOCOR) 20 MG tablet Take 20 mg by mouth at bedtime.        . temazepam (RESTORIL) 15 MG capsule Take 1-2 capsules (15-30 mg total) by mouth at bedtime as needed for sleep.  60 capsule  0  . traMADol (ULTRAM) 50 MG tablet Take 50 mg by mouth every 6 (six) hours as needed.      . Zoledronic Acid (ZOMETA) 4 MG/100ML IVPB Inject 4 mg into the vein every 30 (thirty) days.      . isosorbide mononitrate (IMDUR) 30 MG 24 hr tablet Take 1 tablet (30 mg total) by mouth daily.  30 tablet  6   No current facility-administered medications for this visit.    Allergies  Allergen Reactions  . Diphenhydramine Hcl     REACTION: "hyper"  . Morphine And Related Other (See Comments)    hallucinations    History   Social History  . Marital Status: Married    Spouse Name: [name]  Number of Children: 3  . Years of Education: 9th   Occupational History  . retired     Social History Main Topics  . Smoking status: Former Smoker -- 1.00 packs/day for 20 years    Types: Cigarettes    Quit date: 11/14/1994  . Smokeless tobacco: Never Used  . Alcohol Use: No     Comment: previously drank but none for at least 15 years.  . Drug Use: No  . Sexual Activity: Not on file   Other Topics Concern  . Not on file   Social History Narrative   Patient lives at home spouse.   Caffeine Use: Occasionally     Review of Systems: General: negative for chills, fever, night sweats or weight changes.  Cardiovascular: negative for chest pain, dyspnea on exertion, edema, orthopnea, palpitations, paroxysmal nocturnal dyspnea or shortness of breath Dermatological: negative for rash Respiratory: negative for cough or wheezing Urologic: negative for hematuria Abdominal: negative for nausea, vomiting, diarrhea, bright red blood per rectum, melena, or  hematemesis Neurologic: negative for visual changes, syncope, or dizziness He complains of Rt leg pain with walking All other systems reviewed and are otherwise negative except as noted above.    Blood pressure 130/82, pulse 70, height _0  (1.727 m), weight 195 lb (88.451 kg).  General appearance: alert, cooperative and no distress Neck: no carotid bruit and no JVD Lungs: clear to auscultation bilaterally Heart: regular rate and rhythm Extremities: no edema, diminnished pulses  EKG NSR without acute changes.  ASSESSMENT AND PLAN:   Chest pain with moderate risk for cardiac etiology He presented today with a history of SSCP with exertion, worrisome for new angina.  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    PLAN  I added Imdur 30 mg and set him up for a Myoview next week. I also think he need LEA dopplers. He had aortic and iliac dopplers 11/23/13 that were normal but no ABIs  And he has documented popliteal disease on the Rt. We will see him back after his Myoview.   [facility] KPA-C 11/25/2013 4:56 PM

## 2013-11-25 NOTE — Assessment & Plan Note (Signed)
Bone marrow transplant 2011

## 2013-11-26 ENCOUNTER — Encounter (HOSPITAL_BASED_OUTPATIENT_CLINIC_OR_DEPARTMENT_OTHER): Payer: Medicare Other

## 2013-11-26 ENCOUNTER — Ambulatory Visit (HOSPITAL_COMMUNITY)
Admission: RE | Admit: 2013-11-26 | Discharge: 2013-11-26 | Disposition: A | Discharge status: Home / Self Care | Payer: Medicare Other | Source: Ambulatory Visit | Attending: Internal Medicine | Admitting: Internal Medicine

## 2013-11-26 DIAGNOSIS — I739 Peripheral vascular disease, unspecified: Secondary | ICD-10-CM | POA: Diagnosis not present

## 2013-11-26 DIAGNOSIS — I714 Abdominal aortic aneurysm, without rupture, unspecified: Secondary | ICD-10-CM | POA: Diagnosis not present

## 2013-11-26 DIAGNOSIS — C8589 Other specified types of non-Hodgkin lymphoma, extranodal and solid organ sites: Secondary | ICD-10-CM | POA: Diagnosis not present

## 2013-11-26 DIAGNOSIS — C9000 Multiple myeloma not having achieved remission: Secondary | ICD-10-CM | POA: Diagnosis not present

## 2013-11-26 DIAGNOSIS — I70219 Atherosclerosis of native arteries of extremities with intermittent claudication, unspecified extremity: Secondary | ICD-10-CM

## 2013-11-26 DIAGNOSIS — G4700 Insomnia, unspecified: Secondary | ICD-10-CM | POA: Diagnosis not present

## 2013-11-26 LAB — LACTATE DEHYDROGENASE: LDH: 162 U/L (ref 94–250)

## 2013-11-26 LAB — COMPREHENSIVE METABOLIC PANEL
ALK PHOS: 81 U/L (ref 39–117)
ALT: 11 U/L (ref 0–53)
AST: 18 U/L (ref 0–37)
Albumin: 3.6 g/dL (ref 3.5–5.2)
BUN: 19 mg/dL (ref 6–23)
CO2: 25 mEq/L (ref 19–32)
CREATININE: 0.95 mg/dL (ref 0.50–1.35)
Calcium: 9.7 mg/dL (ref 8.4–10.5)
Chloride: 103 mEq/L (ref 96–112)
GFR calc Af Amer: >90 mL/min (ref >90)
GFR calc non Af Amer: 82 mL/min — ABNORMAL LOW (ref >90)
Glucose, Bld: 88 mg/dL (ref 70–99)
POTASSIUM: 4.4 meq/L (ref 3.7–5.3)
Sodium: 140 mEq/L (ref 137–147)
TOTAL PROTEIN: 6.9 g/dL (ref 6.0–8.3)
Total Bilirubin: 0.5 mg/dL (ref 0.3–1.2)

## 2013-11-26 LAB — CBC WITH DIFFERENTIAL/PLATELET
BASOS PCT: 2 % — AB (ref 0–1)
Basophils Absolute: 0.1 10*3/uL (ref 0.0–0.1)
Eosinophils Absolute: 0.3 10*3/uL (ref 0.0–0.7)
Eosinophils Relative: 8 % — ABNORMAL HIGH (ref 0–5)
HCT: 36.9 % — ABNORMAL LOW (ref 39.0–52.0)
HEMOGLOBIN: 12.5 g/dL — AB (ref 13.0–17.0)
Lymphocytes Relative: 46 % (ref 12–46)
Lymphs Abs: 1.7 10*3/uL (ref 0.7–4.0)
MCH: 30.7 pg (ref 26.0–34.0)
MCHC: 33.9 g/dL (ref 30.0–36.0)
MCV: 90.7 fL (ref 78.0–100.0)
MONOS PCT: 11 % (ref 3–12)
Monocytes Absolute: 0.4 10*3/uL (ref 0.1–1.0)
NEUTROS ABS: 1.2 10*3/uL — AB (ref 1.7–7.7)
NEUTROS PCT: 34 % — AB (ref 43–77)
Platelets: 130 10*3/uL — ABNORMAL LOW (ref 150–400)
RBC: 4.07 MIL/uL — ABNORMAL LOW (ref 4.22–5.81)
RDW: 15.5 % (ref 11.5–15.5)
WBC: 3.6 10*3/uL — ABNORMAL LOW (ref 4.0–10.5)

## 2013-11-26 LAB — C-REACTIVE PROTEIN: CRP: <0.5 mg/dL — ABNORMAL LOW (ref <0.60)

## 2013-11-26 NOTE — Progress Notes (Signed)
Arterial Duplex Lower Ext. Completed. Rayshawn Visconti, BS, RDMS, RVT  

## 2013-11-26 NOTE — Progress Notes (Signed)
Labs drawn today for mm,kllc,crp,ldh,b53mic,cbc/diff,cmp

## 2013-11-29 ENCOUNTER — Other Ambulatory Visit (HOSPITAL_COMMUNITY): Payer: Self-pay | Admitting: Oncology

## 2013-11-29 DIAGNOSIS — E876 Hypokalemia: Secondary | ICD-10-CM

## 2013-11-29 LAB — BETA 2 MICROGLOBULIN, SERUM: BETA 2 MICROGLOBULIN: 2.18 mg/L — AB (ref 1.01–1.73)

## 2013-11-29 MED ORDER — POTASSIUM CHLORIDE CRYS ER 20 MEQ PO TBCR: 20.0000 meq | EXTENDED_RELEASE_TABLET | Freq: Three times a day (TID) | ORAL | Status: DC

## 2013-11-30 ENCOUNTER — Encounter: Payer: Self-pay | Admitting: *Deleted

## 2013-11-30 ENCOUNTER — Encounter (HOSPITAL_BASED_OUTPATIENT_CLINIC_OR_DEPARTMENT_OTHER): Payer: Medicare Other

## 2013-11-30 VITALS — BP 122/66 | HR 61 | Temp 98.6°F | Resp 18 | Wt 193.4 lb

## 2013-11-30 DIAGNOSIS — D801 Nonfamilial hypogammaglobulinemia: Secondary | ICD-10-CM | POA: Diagnosis not present

## 2013-11-30 DIAGNOSIS — C8589 Other specified types of non-Hodgkin lymphoma, extranodal and solid organ sites: Secondary | ICD-10-CM

## 2013-11-30 DIAGNOSIS — C9000 Multiple myeloma not having achieved remission: Secondary | ICD-10-CM | POA: Diagnosis not present

## 2013-11-30 LAB — KAPPA/LAMBDA LIGHT CHAINS
Kappa free light chain: 3.53 mg/dL — ABNORMAL HIGH (ref 0.33–1.94)
Kappa, lambda light chain ratio: 1.33 (ref 0.26–1.65)
Lambda free light chains: 2.65 mg/dL — ABNORMAL HIGH (ref 0.57–2.63)

## 2013-11-30 LAB — MULTIPLE MYELOMA PANEL, SERUM
ALPHA-1-GLOBULIN: 4.9 % (ref 2.9–4.9)
Albumin ELP: 58.5 % (ref 55.8–66.1)
Alpha-2-Globulin: 10.4 % (ref 7.1–11.8)
Beta 2: 4.4 % (ref 3.2–6.5)
Beta Globulin: 6.9 % (ref 4.7–7.2)
Gamma Globulin: 14.9 % (ref 11.1–18.8)
IGA: 224 mg/dL (ref 68–379)
IgG (Immunoglobin G), Serum: 925 mg/dL (ref 650–1600)
IgM, Serum: 23 mg/dL — ABNORMAL LOW (ref 41–251)
M-Spike, %: NOT DETECTED g/dL
TOTAL PROTEIN: 6.5 g/dL (ref 6.0–8.3)

## 2013-11-30 MED ORDER — DEXTROSE 5 % IV SOLN
INTRAVENOUS | Status: DC
  Administered 2013-11-30: 11:00:00 via INTRAVENOUS

## 2013-11-30 MED ORDER — ACETAMINOPHEN 325 MG PO TABS: 650.0000 mg | ORAL_TABLET | Freq: Four times a day (QID) | ORAL | Status: DC | PRN

## 2013-11-30 MED ORDER — ZOLEDRONIC ACID 4 MG/5ML IV CONC
4.0000 mg | Freq: Once | INTRAVENOUS | Status: AC
  Administered 2013-11-30: 4 mg via INTRAVENOUS
  Filled 2013-11-30: qty 5

## 2013-11-30 MED ORDER — IMMUNE GLOBULIN (HUMAN) 10 GM/200ML IV SOLN
10.0000 g | Freq: Once | INTRAVENOUS | Status: AC
  Administered 2013-11-30: 10 g via INTRAVENOUS
  Filled 2013-11-30: qty 200

## 2013-11-30 MED ORDER — IMMUNE GLOBULIN (HUMAN) 10 GM/100ML IV SOLN: 0.4000 g/kg | Freq: Once | INTRAVENOUS | Status: DC

## 2013-11-30 MED ORDER — SODIUM CHLORIDE 0.9 % IV SOLN: Freq: Once | INTRAVENOUS | Status: DC

## 2013-11-30 MED ORDER — HEPARIN SOD (PORK) LOCK FLUSH 100 UNIT/ML IV SOLN
500.0000 [IU] | Freq: Once | INTRAVENOUS | Status: AC | PRN
Start: 2013-11-30 — End: 2013-11-30
  Administered 2013-11-30: 500 [IU]
  Filled 2013-11-30: qty 5

## 2013-11-30 MED ORDER — IMMUNE GLOBULIN (HUMAN) 5 GM/100ML IV SOLN
25.0000 g | Freq: Once | INTRAVENOUS | Status: AC
  Administered 2013-11-30: 25 g via INTRAVENOUS
  Filled 2013-11-30: qty 100

## 2013-11-30 MED ORDER — SODIUM CHLORIDE 0.9 % IJ SOLN: 10.0000 mL | INTRAMUSCULAR | Status: DC | PRN

## 2013-11-30 NOTE — Progress Notes (Signed)
Tolerated well

## 2013-12-01 ENCOUNTER — Ambulatory Visit (INDEPENDENT_AMBULATORY_CARE_PROVIDER_SITE_OTHER): Payer: Medicare Other | Admitting: Cardiology

## 2013-12-01 ENCOUNTER — Ambulatory Visit (HOSPITAL_COMMUNITY)
Admission: RE | Admit: 2013-12-01 | Discharge: 2013-12-01 | Disposition: A | Discharge status: Home / Self Care | Payer: Medicare Other | Source: Ambulatory Visit | Attending: Cardiovascular Disease | Admitting: Cardiovascular Disease

## 2013-12-01 ENCOUNTER — Encounter: Payer: Self-pay | Admitting: Cardiology

## 2013-12-01 VITALS — BP 128/72 | HR 72 | Ht 68.0 in | Wt 193.1 lb

## 2013-12-01 DIAGNOSIS — R079 Chest pain, unspecified: Secondary | ICD-10-CM | POA: Diagnosis not present

## 2013-12-01 DIAGNOSIS — I2510 Atherosclerotic heart disease of native coronary artery without angina pectoris: Secondary | ICD-10-CM | POA: Diagnosis not present

## 2013-12-01 DIAGNOSIS — Z950 Presence of cardiac pacemaker: Secondary | ICD-10-CM

## 2013-12-01 DIAGNOSIS — Z8679 Personal history of other diseases of the circulatory system: Secondary | ICD-10-CM

## 2013-12-01 DIAGNOSIS — C9000 Multiple myeloma not having achieved remission: Secondary | ICD-10-CM

## 2013-12-01 DIAGNOSIS — I6340 Cerebral infarction due to embolism of unspecified cerebral artery: Secondary | ICD-10-CM

## 2013-12-01 DIAGNOSIS — I723 Aneurysm of iliac artery: Secondary | ICD-10-CM

## 2013-12-01 MED ORDER — TECHNETIUM TC 99M SESTAMIBI GENERIC - CARDIOLITE
30.0000 | Freq: Once | INTRAVENOUS | Status: AC | PRN
  Administered 2013-12-01: 30 via INTRAVENOUS

## 2013-12-01 MED ORDER — REGADENOSON 0.4 MG/5ML IV SOLN
0.4000 mg | Freq: Once | INTRAVENOUS | Status: AC
Start: 2013-12-01 — End: 2013-12-01
  Administered 2013-12-01: 0.4 mg via INTRAVENOUS

## 2013-12-01 MED ORDER — TECHNETIUM TC 99M SESTAMIBI GENERIC - CARDIOLITE
10.0000 | Freq: Once | INTRAVENOUS | Status: AC | PRN
  Administered 2013-12-01: 10 via INTRAVENOUS

## 2013-12-01 MED ORDER — AMINOPHYLLINE 25 MG/ML IV SOLN
75.0000 mg | Freq: Once | INTRAVENOUS | Status: AC
  Administered 2013-12-01: 75 mg via INTRAVENOUS

## 2013-12-01 NOTE — Assessment & Plan Note (Signed)
This has improved. He is now on Imdur. Myoview appeared unchanged from previous study.

## 2013-12-01 NOTE — Assessment & Plan Note (Signed)
Stage 4 B-cell non- Hodgkins Lymphoma

## 2013-12-01 NOTE — Assessment & Plan Note (Signed)
Left cortical infarct s/p IV tpa

## 2013-12-01 NOTE — Progress Notes (Signed)
12/01/2013 Jeremy Parrish   02-22-1941  [id number]  Primary Physicia [name], MD Primary Cardiologist: Dr Sallyanne Kuster  HPI:  73 y/o with a history of CAD and CVA. He had RCA and CFX stenting in '96 with ISR-PCI CFX in '97. Last cath was '07 and last Myoview was '09. He has done well from a cardiac standpoint. He had a MDT pacemaker implanted in 2012. 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 Other medical problems include multiple myeloma, PVD with a history of a LCIA aneurysm by CT scan in 2010, and DJD, s/p rotator cuff repair Sept 2014.      He was seen in the office 11/25/13 with complaints of exertional chest pain that was worrisome for new angina. He had had 3-4 episodes over the past 6 weeks associated with walking, relived with rest. He has not used NTG. He denied associated SOB, nausea, radiation, or diaphoresis.       I started him on Imdur 30 mg and made sure he had up to date NTG.  Myoview was done this am. It showed no apparent change from 2009 with inferior , lateral scar but no ischemia. He has not had recurrent chest pain. Aortic doppler done 11/23/13 showed no evidence of an iliac aneurysm. He will continue with the current Rx and see Dr Gwenlyn Found in 3 months. He knows to contact us if he has recurrent or unstable chest pain.     Current Outpatient Prescriptions  Medication Sig Dispense Refill  . amLODipine (NORVASC) 10 MG tablet Take 10 mg by mouth daily.        Marland Kitchen aspirin 81 MG tablet Take 81 mg by mouth daily.      . citalopram (CELEXA) 40 MG tablet Take 40 mg by mouth daily.       . clopidogrel (PLAVIX) 75 MG tablet TAKE 1 TABLET BY MOUTH EVERY DAY  90 tablet  3  . gabapentin (NEURONTIN) 600 MG tablet Take 600 mg by mouth 3 (three) times daily.      Marland Kitchen ibuprofen (ADVIL,MOTRIN) 200 MG tablet Take 200 mg by mouth every 6 (six) hours as needed.      . isosorbide mononitrate (IMDUR) 30 MG 24  hr tablet Take 1 tablet (30 mg total) by mouth daily.  30 tablet  6  . lenalidomide (REVLIMID) 10 MG capsule Take 1 capsule (10 mg total) by mouth daily. Take 1 capsule by mouth daily for 7 days on followed by 7 days off  14 capsule  0  . nitroGLYCERIN (NITROSTAT) 0.4 MG SL tablet Place 1 tablet (0.4 mg total) under the tongue every 5 (five) minutes as needed for chest pain.  25 tablet  12  . omeprazole (PRILOSEC) 20 MG capsule Take 1 capsule (20 mg total) by mouth daily.  90 capsule  1  . oxyCODONE-acetaminophen (PERCOCET/ROXICET) 5-325 MG per tablet Take 1 tablet by mouth every 6 (six) hours as needed.  60 tablet  0  . potassium chloride SA (KLOR-CON M20) 20 MEQ tablet Take 1 tablet (20 mEq total) by mouth 3 (three) times daily.  90 tablet  1  . simvastatin (ZOCOR) 20 MG tablet Take 20 mg by mouth at bedtime.        . temazepam (RESTORIL) 15 MG capsule Take 1-2 capsules (15-30 mg total) by mouth at bedtime as needed for sleep.  60 capsule  0  . traMADol (ULTRAM)  50 MG tablet Take 50 mg by mouth every 6 (six) hours as needed.      . Zoledronic Acid (ZOMETA) 4 MG/100ML IVPB Inject 4 mg into the vein every 30 (thirty) days.       No current facility-administered medications for this visit.    Allergies  Allergen Reactions  . Diphenhydramine Hcl     REACTION: "hyper"  . Morphine And Related Other (See Comments)    hallucinations    History   Social History  . Marital Status: Married    Spouse Name: [name]    Number of Children: 3  . Years of Education: 9th   Occupational History  . retired     Social History Main Topics  . Smoking status: Former Smoker -- 1.00 packs/day for 20 years    Types: Cigarettes    Quit date: 11/14/1994  . Smokeless tobacco: Never Used  . Alcohol Use: No     Comment: previously drank but none for at least 15 years.  . Drug Use: No  . Sexual Activity: Not on file   Other Topics Concern  . Not on file   Social History Narrative   Patient lives at  home spouse.   Caffeine Use: Occasionally     Review of Systems: General: negative for chills, fever, night sweats or weight changes.  Cardiovascular: negative for chest pain, dyspnea on exertion, edema, orthopnea, palpitations, paroxysmal nocturnal dyspnea or shortness of breath Dermatological: negative for rash Respiratory: negative for cough or wheezing Urologic: negative for hematuria Abdominal: negative for nausea, vomiting, diarrhea, bright red blood per rectum, melena, or hematemesis Neurologic: negative for visual changes, syncope, or dizziness All other systems reviewed and are otherwise negative except as noted above.    Blood pressure 128/72, pulse 72, height $RemoveBe'5\' 8"'bvwlRAtCB$  (1.727 m), weight 193 lb 1.6 oz (87.59 kg).  General appearance: alert, cooperative, no distress and expressive aphasia Lungs: clear to auscultation bilaterally Heart: regular rate and rhythm    ASSESSMENT AND PLAN:   Chest pain with moderate risk for cardiac etiology This has improved. He is now on Imdur. Myoview appeared unchanged from previous study.  CAD, dating to 1996 with multiple PCIs, Stents to RCA and LCX, Last cath 2007 patent stents,30% prox LAD lesion, EF 45% Myoview 2009 inferior lateral scar without ischemia.  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  MULTIPLE  MYELOMA Stage 4 B-cell non- Hodgkins Lymphoma  Aneurysm of iliac artery Not seen on Abd [place] 11/23/13   PLAN  Follow up with Dr Gwenlyn Found in 3 months, continue Imdur 30 mg.  Nihira Puello KPA-C 12/01/2013 2:36 PM

## 2013-12-01 NOTE — Assessment & Plan Note (Signed)
Myoview 2009 inferior lateral scar without ischemia.

## 2013-12-01 NOTE — Assessment & Plan Note (Signed)
Not seen on Abd [place] 11/23/13

## 2013-12-01 NOTE — Procedures (Addendum)
[facility] [address] [place] [address] [phone]  Cardiology Nuclear Med Study  Jeremy Parrish is a 73 y.o. male     MRN : [id number]     DOB: 03/16/1941  Procedure Date: 12/01/2013  Nuclear Med Background Indication for Stress Test:  Evaluation for Ischemia, Stent Patency and PTCA Patency History:  Asthma, COPD and CAD;MI-1996;STENT X5/PTCA--1996;Pacer;Cerebral Emboli w/infarct;CHf;aneuysm of the iliac artery;bladder CA;Lymphoma Cardiac Risk Factors: CVA, Family History - CAD, History of Smoking, Hypertension, Lipids, Overweight, PVD and AAA  Symptoms:  Chest Pain and SOB   Nuclear Pre-Procedure Caffeine/Decaff Intake:  7:00pm NPO After: 5:00am   IV Site: R Hand  IV 0.9% NS with Angio Cath:  22g  Chest Size (in):  42"  IV Started by: Azucena Cecil, RN  Height: 5\' 8"  (1.727 m)  Cup Size: n/a  BMI:  Body mass index is 29.5 kg/(m^2). Weight:  194 lb (87.998 kg)   Tech Comments:  n/a    Nuclear Med Study 1 or 2 day study: 1 day  Stress Test Type:  [place]  Order Authorizing Provider:  Quay Burow, MD   Resting Radionuclide: Technetium 77m Sestamibi  Resting Radionuclide Dose: 10.3 mCi   Stress Radionuclide:  Technetium 45m Sestamibi  Stress Radionuclide Dose: 30.2 mCi           Stress Protocol Rest HR: 62 Stress HR: 60  Rest BP: 148/93 Stress BP: 146/88  Exercise Time (min): n/a METS: n/a   Predicted Max HR: 149 bpm % Max HR: 41.61 bpm Rate Pressure Product: 9176  Dose of Adenosine (mg):  n/a Dose of Lexiscan: 0.4 mg  Dose of Atropine (mg): n/a Dose of Dobutamine: n/a mcg/kg/min (at max HR)  Stress Test Technologist: Leane Para, CCT Nuclear Technologist: Otho Perl, CNMT   Rest Procedure:  Myocardial perfusion imaging was performed at rest 45 minutes following the intravenous administration of Technetium 20m Sestamibi. Stress Procedure:  The patient received IV Lexiscan 0.4 mg over  15-seconds.  Technetium 23m Sestamibi injected at 30-seconds. The patient experienced Nausea and SOB; 75 mg of IV Aminophylline was administered with resolution of symptoms. There were no significant changes with Lexiscan.  Quantitative spect images were obtained after a 45 minute delay.  Transient Ischemic Dilatation (Normal <1.22):  0.98 Lung/Heart Ratio (Normal <0.45):  0.38 QGS EDV:  163 ml QGS ESV:  93 ml LV Ejection Fraction: 43%     Rest ECG: NSR, old lateral MI  Stress ECG: No significant change from baseline ECG, There are scattered PVCs. and There are scattered PACs.  QPS Raw Data Images:  Normal; no motion artifact; normal heart/lung ratio. Stress Images:  There is a severe and large defect in the entire inferior and inferolateral wall. The septum is spared. Rest Images:  Comparison with the stress images reveals no significant change. Subtraction (SDS):  There is a fixed defect that is most consistent with a previous infarction.  Impression Exercise Capacity:  Lexiscan with no exercise. BP Response:  Normal blood pressure response. Clinical Symptoms:  No significant symptoms noted. ECG Impression:  No significant ECG changes with Lexiscan. Comparison with Prior Nuclear Study: No significant change from previous study  Overall Impression:  Intermediate risk stress nuclear study with a large scar, but no significant reversibile ischemia.  LV Wall Motion:  Non gated study (ectopy)   Jeremy Erhard, MD  12/01/2013 12:19 PM

## 2013-12-01 NOTE — Patient Instructions (Signed)
See Dr Gwenlyn Found in 3 months

## 2013-12-09 ENCOUNTER — Ambulatory Visit (INDEPENDENT_AMBULATORY_CARE_PROVIDER_SITE_OTHER): Payer: Medicare Other | Admitting: Nurse Practitioner

## 2013-12-09 ENCOUNTER — Encounter: Payer: Self-pay | Admitting: Nurse Practitioner

## 2013-12-09 VITALS — BP 116/61 | HR 68 | Ht 68.0 in | Wt 195.0 lb

## 2013-12-09 DIAGNOSIS — I6350 Cerebral infarction due to unspecified occlusion or stenosis of unspecified cerebral artery: Secondary | ICD-10-CM

## 2013-12-09 DIAGNOSIS — R4701 Aphasia: Secondary | ICD-10-CM

## 2013-12-09 NOTE — Patient Instructions (Signed)
PLAN:  Continue aspirin and Plavix for secondary stroke prevention given history of cardiac stents as well as strict control of hypertension with blood pressure goal below 130/90 and hyperlipidemia with LDL cholesterol goal below 100 mg percent. I encouraged him to speak slowly and deliberately not been a rash to minimize his expressive language difficulties.  Return for followup in 6 months.

## 2013-12-09 NOTE — Progress Notes (Signed)
PATIENT: Jeremy Parrish DOB: October 01, 1941   REASON FOR VISIT: follow up for stroke HISTORY FROM: patient  HISTORY OF PRESENT ILLNESS: 02/17/13 (PS): 73 year old male seen for first office followup visit for hospital admission for stroke on 10/06/12 . He presented with sudden onset of inability to speak with right facial droop and right-sided weakness. His symptoms started resolving en route to the hospital. He met criteria for and was given IV TPA uneventfully. 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 Patient had a questionable history of atrial fibrillation. Patient had a pacemaker and cardiology interrogated the pacemaker but did not document atrial fibrillation. TEE was done which did not show any patent foramen ovale or cardiac source of embolism. Transthoraxic echocardiogram showed normal ejection fraction. Lipid profile and HbA1c was normal. CT angiogram of the neck showed only minor atheromatous changes and CT angiogram of the brain showed no large vessel stenosis. Vascular risk factors identified included coronary artery disease, hypertension, hyperlipidemia and pre diabetes. He states his speech is much better he can speak sentences but struggles when he tries to speak fast. He has finished outpatient speech therapy. His right hand strength is completely back to normal. The patient did not have outpatient cardiac telemetry done as planned as he has a pacemaker which was interrogated by his cardiologist and did not show atrial fibrillation.   UPDATE 06/08/13 (LL): Patient returns for 8 month follow up after stroke on 10/06/12. Patient reports that he is doing well, has significant speech difficulty, says some days are better than others. All other weakness has resolved. He continues to take Plavix and aspirin daily with no side effects of bleeding or significant bruising. No new neurovascular complaints.   UPDATE 12/09/13 (LL):   Jeremy Parrish  returns for stroke follow up.  He reports that he is doing well; except he had pneumonia and was hospitalized for 4 days in November and had chest pressure with a negative stress test earlier this month.  His blood pressure and cholesterol are well controlled.  BP in office today is 116/61.  He continues to take Plavix and aspirin daily with no side effects of bleeding or significant bruising. No new neurovascular complaints. He states that his cardiologists rechecks his carotid doppler study annually.  He is getting monthly infusions of Zometa and IVIG by oncology.  REVIEW OF SYSTEMS: Full 14 system review of systems performed and notable only for: drooling, cough, shortness of breath, choking, leg swelling, rectal bleeding, diarrhea, daytime sleepiness, snoring, frequency of urination, urgency, joint pain, aching muscles, speech difficulty, anxiety.   ALLERGIES: Allergies  Allergen Reactions  . Diphenhydramine Hcl     REACTION: "hyper"  . Morphine And Related Other (See Comments)    hallucinations    HOME MEDICATIONS: Outpatient Prescriptions Prior to Visit  Medication Sig Dispense Refill  . amLODipine (NORVASC) 10 MG tablet Take 10 mg by mouth daily.        Marland Kitchen aspirin 81 MG tablet Take 81 mg by mouth daily.      . citalopram (CELEXA) 40 MG tablet Take 40 mg by mouth daily.       . clopidogrel (PLAVIX) 75 MG tablet TAKE 1 TABLET BY MOUTH EVERY DAY  90 tablet  3  . gabapentin (NEURONTIN) 600 MG tablet Take 600 mg by mouth 3 (three) times daily.      Marland Kitchen ibuprofen (ADVIL,MOTRIN) 200 MG tablet Take 200 mg by mouth every 6 (six) hours as  needed.      . isosorbide mononitrate (IMDUR) 30 MG 24 hr tablet Take 1 tablet (30 mg total) by mouth daily.  30 tablet  6  . lenalidomide (REVLIMID) 10 MG capsule Take 1 capsule (10 mg total) by mouth daily. Take 1 capsule by mouth daily for 7 days on followed by 7 days off  14 capsule  0  . nitroGLYCERIN (NITROSTAT) 0.4 MG SL tablet Place 1 tablet (0.4 mg  total) under the tongue every 5 (five) minutes as needed for chest pain.  25 tablet  12  . omeprazole (PRILOSEC) 20 MG capsule Take 1 capsule (20 mg total) by mouth daily.  90 capsule  1  . oxyCODONE-acetaminophen (PERCOCET/ROXICET) 5-325 MG per tablet Take 1 tablet by mouth every 6 (six) hours as needed.  60 tablet  0  . potassium chloride SA (KLOR-CON M20) 20 MEQ tablet Take 1 tablet (20 mEq total) by mouth 3 (three) times daily.  90 tablet  1  . simvastatin (ZOCOR) 20 MG tablet Take 20 mg by mouth at bedtime.        . temazepam (RESTORIL) 15 MG capsule Take 1-2 capsules (15-30 mg total) by mouth at bedtime as needed for sleep.  60 capsule  0  . traMADol (ULTRAM) 50 MG tablet Take 50 mg by mouth every 6 (six) hours as needed.      . Zoledronic Acid (ZOMETA) 4 MG/100ML IVPB Inject 4 mg into the vein every 30 (thirty) days.       No facility-administered medications prior to visit.    PAST MEDICAL HISTORY: Past Medical History  Diagnosis Date  . Hypertension   . Heart disease   . Kidney stones     history  . Lung mass   . Heart murmur   . 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 Coronary artery disease   . Depression   . Shortness of breath   . Peripheral arterial disease   . Bladder neck contracture   . Personal history of other diseases of circulatory system   . Aortic aneurysm of unspecified site without mention of rupture   . Arthritis   . Intestinovesical fistula   . Esophageal reflux   . Hyperlipidemia   . Anemia   . CHF (congestive heart failure)   . COPD (chronic obstructive pulmonary disease)   . Myocardial infarction   . Cerebral atherosclerosis     Carotid Doppler, 02/16/2013 - Bilateral Proximal ICAs,demonstrate mild plaque w/o evidence of significant diameter reduction, dissection, or any other vascular abnormality  . Complication of anesthesia   . PONV (postoperative nausea and vomiting)   . Stroke  2013    Speech.  . Cancer   . Prostate cancer 2000  . Multiple myeloma   . Hx of bladder cancer 10/07/2012  . Non Hodgkin's lymphoma     PAST SURGICAL HISTORY: Past Surgical History  Procedure Laterality Date  . Prostate surgery    . Heart stents x 5  1999  . Portacath placement  07/26/2009  . Wrist surgery      right  . Left ear skin cancer removed    . Bone marrow transplant  2011  . Pacemaker insertion  07/22/2011    [employer]  . Coronary angioplasty  06/24/2000    PCI and stenting in mid & proximal RCA  . Insert / replace / remove pacemaker    . Tee without cardioversion  10/13/2012  Procedure: TRANSESOPHAGEAL ECHOCARDIOGRAM (TEE);  Surgeon: Sanda Klein, MD;  Location[facility] ENDOSCOPY;  Service: Cardiovascular;  Laterality: N/A;  pat/kay/echo notified  . Colonoscopy N/A 01/01/2013    Procedure: COLONOSCOPY;  Surgeon: Rogene Houston, MD;  Location: [facility] ENDO SUITE;  Service: Endoscopy;  Laterality: N/A;  825-moved to [place] notified pt  . Rotator    . Rotator cuff repair Right   . Colon surgery      colon resection  . Bladder surgery    . Shoulder arthroscopy with subacromial decompression Right 07/21/2013    Procedure: RIGHT SHOULDER ARTHROSCOPY WITH SUBACROMIAL DECOMPRESSION AND DEBRIDEMENT & Injection of Left Shoulder;  Surgeon: Alta Corning, MD;  Location: [facility];  Service: Orthopedics;  Laterality: Right;  . US echocardiography  06/19/2011    RV mildly dilated,mild to mod. MR,mild AI,mild PI  . Nm myocar perf wall motion  11/27/2007    inferior scar    FAMILY HISTORY: Family History  Problem Relation Age of Onset  . Colon cancer Neg Hx   . Colon polyps Neg Hx   . Cancer Father     bladder  . Heart disease Father     before age 78  . Hypertension Mother   . Cancer Brother   . Heart disease Brother     before age 9  . Heart disease Sister     before age 36  . Hyperlipidemia Sister   . Hypertension Sister   . Heart attack Sister     SOCIAL HISTORY: History    Social History  . Marital Status: Married    Spouse Name: Ivy     Number of Children: 3  . Years of Education: 9th   Occupational History  . retired     Social History Main Topics  . Smoking status: Former Smoker -- 1.00 packs/day for 20 years    Types: Cigarettes    Quit date: 11/14/1994  . Smokeless tobacco: Never Used  . Alcohol Use: No     Comment: previously drank but none for at least 15 years.  . Drug Use: No  . Sexual Activity: Not on file   Other Topics Concern  . Not on file   Social History Narrative   Patient lives at home spouse.   Caffeine Use: Occasionally     PHYSICAL EXAM  Filed Vitals:   12/09/13 1321  BP: 116/61  Pulse: 68  Height: _0  (1.727 m)  Weight: 195 lb (88.451 kg)   Body mass index is 29.66 kg/(m^2).  Physical Exam  General: well developed, well nourished, seated, in no evident distress  Head: head normocephalic and atraumatic. Orohparynx benign  Neck: supple with no carotid or supraclavicular bruits  Cardiovascular:  regular rate and rhythm, no murmurs  Musculoskeletal: no deformity  Skin: no rash/petichiae. Right ear lobe deformity from prior skin cancer surgery  Vascular: Normal pulses all extremities   Neurologic Exam  Mental Status: Awake and fully alert. Oriented to place and time. Recent and remote memory intact. Attention span, concentration and fund of knowledge appropriate. Mood and affect appropriate. Mild expressive aphasia with nonfluent speech and word finding difficulties. Only occasional paraphasias. Able to name repeat and comprehend well.  Cranial Nerves: Fundoscopic exam reveals sharp disc margins. Pupils equal, briskly reactive to light. Extraocular movements full without nystagmus. Visual fields full to confrontation. Hearing intact. Facial sensation intact. Face, tongue, palate moves normally and symmetrically.  Motor: Normal bulk and tone. Normal strength in all tested extremity muscles.  Sensory.:  intact  to touch, pinprick and vibratory.  Coordination: Rapid alternating movements normal in all extremities. Finger-to-nose and heel-to-shin performed accurately bilaterally.  Gait and Station: Arises from chair without difficulty. Stance is normal. Gait demonstrates normal stride length and balance . Able to heel, toe and tandem walk without difficulty.  Reflexes: 1+ and symmetric. Toes downgoing.   DIAGNOSTIC DATA (LABS, IMAGING, TESTING) - I reviewed patient records, labs, notes, testing and imaging myself where available.  Lab Results  Component Value Date   WBC 3.6* 11/26/2013   HGB 12.5* 11/26/2013   HCT 36.9* 11/26/2013   MCV 90.7 11/26/2013   PLT 130* 11/26/2013      Component Value Date/Time   NA 140 11/26/2013 1029   K 4.4 11/26/2013 1029   CL 103 11/26/2013 1029   CO2 25 11/26/2013 1029   GLUCOSE 88 11/26/2013 1029   BUN 19 11/26/2013 1029   CREATININE 0.95 11/26/2013 1029   CREATININE 0.77 04/02/2013 1025   CALCIUM 9.7 11/26/2013 1029   PROT 6.9 11/26/2013 1029   PROT 6.5 11/26/2013 1029   ALBUMIN 3.6 11/26/2013 1029   AST 18 11/26/2013 1029   ALT 11 11/26/2013 1029   ALKPHOS 81 11/26/2013 1029   BILITOT 0.5 11/26/2013 1029   GFRNONAA 82* 11/26/2013 1029   GFRAA >90 11/26/2013 1029   Carotid Doppler study 02/14/2013: Bilateral proximal ICAs demonstrated a mild amount of plaque without evidence of a significant diameter reduction, dissection or any other vascular abnormality. This is a mildly abnormal carotid duplex Doppler evaluation. It is recommended that this test be repeated in 24 months.  ASSESSMENT AND PLAN  72 year old male with embolic left MCA branch infarct in November 2013 treated with IV TPA with mild residual expressive aphasia. Multiple vascular risk factors of hypertension, hyperlipidemia and coronary artery disease.   PLAN:  Continue aspirin and Plavix for secondary stroke prevention given history of cardiac stents as well as strict control of hypertension with blood  pressure goal below 130/90 and hyperlipidemia with LDL cholesterol goal below 100 mg percent. I encouraged him to speak slowly and deliberately not been a rash to minimize his expressive language difficulties.  Return for followup in 6 months.   Philmore Pali, MSN, NP-C 12/09/2013, 1:30 PM [facility] [address] [place], [place] [address] [phone]  Note: This document was prepared with digital dictation and possible smart phrase technology. Any transcriptional errors that result from this process are unintentional.

## 2013-12-14 ENCOUNTER — Encounter: Payer: Self-pay | Admitting: *Deleted

## 2013-12-17 ENCOUNTER — Other Ambulatory Visit (HOSPITAL_COMMUNITY): Payer: Self-pay | Admitting: Oncology

## 2013-12-17 DIAGNOSIS — C9000 Multiple myeloma not having achieved remission: Secondary | ICD-10-CM

## 2013-12-17 MED ORDER — LENALIDOMIDE 10 MG PO CAPS: 10.0000 mg | ORAL_CAPSULE | Freq: Every day | ORAL | Status: DC

## 2013-12-27 ENCOUNTER — Encounter: Payer: Medicare Other | Admitting: *Deleted

## 2013-12-28 ENCOUNTER — Ambulatory Visit (HOSPITAL_COMMUNITY): Payer: Medicare Other

## 2013-12-29 ENCOUNTER — Encounter (HOSPITAL_COMMUNITY): Payer: Medicare Other | Attending: Oncology

## 2013-12-29 VITALS — BP 128/74 | HR 60 | Temp 97.1°F | Resp 20 | Wt 191.6 lb

## 2013-12-29 DIAGNOSIS — C9000 Multiple myeloma not having achieved remission: Secondary | ICD-10-CM | POA: Diagnosis not present

## 2013-12-29 DIAGNOSIS — C8589 Other specified types of non-Hodgkin lymphoma, extranodal and solid organ sites: Secondary | ICD-10-CM | POA: Diagnosis not present

## 2013-12-29 DIAGNOSIS — D801 Nonfamilial hypogammaglobulinemia: Secondary | ICD-10-CM

## 2013-12-29 LAB — CBC WITH DIFFERENTIAL/PLATELET
Basophils Absolute: 0.1 10*3/uL (ref 0.0–0.1)
Basophils Relative: 1 % (ref 0–1)
Eosinophils Absolute: 0.1 10*3/uL (ref 0.0–0.7)
Eosinophils Relative: 2 % (ref 0–5)
HEMATOCRIT: 38.1 % — AB (ref 39.0–52.0)
HEMOGLOBIN: 12.9 g/dL — AB (ref 13.0–17.0)
LYMPHS PCT: 50 % — AB (ref 12–46)
Lymphs Abs: 3.9 10*3/uL (ref 0.7–4.0)
MCH: 29.9 pg (ref 26.0–34.0)
MCHC: 33.9 g/dL (ref 30.0–36.0)
MCV: 88.4 fL (ref 78.0–100.0)
MONOS PCT: 11 % (ref 3–12)
Monocytes Absolute: 0.9 10*3/uL (ref 0.1–1.0)
NEUTROS ABS: 2.8 10*3/uL (ref 1.7–7.7)
Neutrophils Relative %: 37 % — ABNORMAL LOW (ref 43–77)
Platelets: 146 10*3/uL — ABNORMAL LOW (ref 150–400)
RBC: 4.31 MIL/uL (ref 4.22–5.81)
RDW: 14.9 % (ref 11.5–15.5)
WBC: 7.8 10*3/uL (ref 4.0–10.5)

## 2013-12-29 LAB — COMPREHENSIVE METABOLIC PANEL
ALT: 15 U/L (ref 0–53)
AST: 22 U/L (ref 0–37)
Albumin: 3.8 g/dL (ref 3.5–5.2)
Alkaline Phosphatase: 81 U/L (ref 39–117)
BILIRUBIN TOTAL: 0.5 mg/dL (ref 0.3–1.2)
BUN: 15 mg/dL (ref 6–23)
CALCIUM: 9.2 mg/dL (ref 8.4–10.5)
CHLORIDE: 102 meq/L (ref 96–112)
CO2: 27 meq/L (ref 19–32)
Creatinine, Ser: 1.08 mg/dL (ref 0.50–1.35)
GFR, EST AFRICAN AMERICAN: 78 mL/min — AB (ref >90)
GFR, EST NON AFRICAN AMERICAN: 67 mL/min — AB (ref >90)
Glucose, Bld: 80 mg/dL (ref 70–99)
Potassium: 3.9 mEq/L (ref 3.7–5.3)
Sodium: 139 mEq/L (ref 137–147)
Total Protein: 7.1 g/dL (ref 6.0–8.3)

## 2013-12-29 LAB — C-REACTIVE PROTEIN

## 2013-12-29 LAB — LACTATE DEHYDROGENASE: LDH: 168 U/L (ref 94–250)

## 2013-12-29 MED ORDER — IMMUNE GLOBULIN (HUMAN) 20 GM/200ML IV SOLN
35.0000 g | Freq: Once | INTRAVENOUS | Status: AC
  Administered 2013-12-29: 35 g via INTRAVENOUS
  Filled 2013-12-29: qty 350

## 2013-12-29 MED ORDER — ACETAMINOPHEN 325 MG PO TABS
650.0000 mg | ORAL_TABLET | Freq: Four times a day (QID) | ORAL | Status: DC | PRN
  Administered 2013-12-29: 650 mg via ORAL
  Filled 2013-12-29: qty 2

## 2013-12-29 MED ORDER — ZOLEDRONIC ACID 4 MG/5ML IV CONC
4.0000 mg | Freq: Once | INTRAVENOUS | Status: AC
  Administered 2013-12-29: 4 mg via INTRAVENOUS
  Filled 2013-12-29: qty 5

## 2013-12-29 MED ORDER — HEPARIN SOD (PORK) LOCK FLUSH 100 UNIT/ML IV SOLN
500.0000 [IU] | Freq: Once | INTRAVENOUS | Status: AC | PRN
  Administered 2013-12-29: 500 [IU]
  Filled 2013-12-29: qty 5

## 2013-12-29 MED ORDER — SODIUM CHLORIDE 0.9 % IJ SOLN: 10.0000 mL | INTRAMUSCULAR | Status: DC | PRN

## 2013-12-29 MED ORDER — SODIUM CHLORIDE 0.9 % IV SOLN
Freq: Once | INTRAVENOUS | Status: AC
  Administered 2013-12-29: 12:00:00 via INTRAVENOUS

## 2013-12-29 NOTE — Progress Notes (Signed)
Jeremy Parrish Tolerated IVIG and zometa well today.  Discharged with no complications

## 2013-12-30 LAB — BETA 2 MICROGLOBULIN, SERUM: BETA 2 MICROGLOBULIN: 1.96 mg/L — AB (ref 1.01–1.73)

## 2013-12-31 ENCOUNTER — Telehealth (HOSPITAL_COMMUNITY): Payer: Self-pay

## 2013-12-31 ENCOUNTER — Other Ambulatory Visit (HOSPITAL_COMMUNITY): Payer: Self-pay | Admitting: Oncology

## 2013-12-31 ENCOUNTER — Ambulatory Visit (HOSPITAL_COMMUNITY): Payer: Medicare Other

## 2013-12-31 ENCOUNTER — Telehealth (HOSPITAL_COMMUNITY): Payer: Self-pay | Admitting: Oncology

## 2013-12-31 DIAGNOSIS — M75102 Unspecified rotator cuff tear or rupture of left shoulder, not specified as traumatic: Secondary | ICD-10-CM

## 2013-12-31 DIAGNOSIS — M12811 Other specific arthropathies, not elsewhere classified, right shoulder: Secondary | ICD-10-CM

## 2013-12-31 DIAGNOSIS — M12812 Other specific arthropathies, not elsewhere classified, left shoulder: Secondary | ICD-10-CM

## 2013-12-31 DIAGNOSIS — M75101 Unspecified rotator cuff tear or rupture of right shoulder, not specified as traumatic: Secondary | ICD-10-CM

## 2013-12-31 DIAGNOSIS — C8589 Other specified types of non-Hodgkin lymphoma, extranodal and solid organ sites: Secondary | ICD-10-CM

## 2013-12-31 DIAGNOSIS — C9000 Multiple myeloma not having achieved remission: Secondary | ICD-10-CM

## 2013-12-31 LAB — MULTIPLE MYELOMA PANEL, SERUM
Albumin ELP: 58.6 % (ref 55.8–66.1)
Alpha-1-Globulin: 4.3 % (ref 2.9–4.9)
Alpha-2-Globulin: 10.7 % (ref 7.1–11.8)
BETA 2: 4.4 % (ref 3.2–6.5)
Beta Globulin: 7.5 % — ABNORMAL HIGH (ref 4.7–7.2)
GAMMA GLOBULIN: 14.5 % (ref 11.1–18.8)
IGG (IMMUNOGLOBIN G), SERUM: 952 mg/dL (ref 650–1600)
IgA: 250 mg/dL (ref 68–379)
IgM, Serum: 22 mg/dL — ABNORMAL LOW (ref 41–251)
M-SPIKE, %: NOT DETECTED g/dL
Total Protein: 6.4 g/dL (ref 6.0–8.3)

## 2013-12-31 LAB — KAPPA/LAMBDA LIGHT CHAINS
Kappa free light chain: 1.97 mg/dL — ABNORMAL HIGH (ref 0.33–1.94)
Kappa, lambda light chain ratio: 1.07 (ref 0.26–1.65)
Lambda free light chains: 1.84 mg/dL (ref 0.57–2.63)

## 2013-12-31 MED ORDER — OXYCODONE-ACETAMINOPHEN 5-325 MG PO TABS: 1.0000 | ORAL_TABLET | Freq: Four times a day (QID) | ORAL | Status: DC | PRN

## 2013-12-31 NOTE — Telephone Encounter (Signed)
Message copied by Mellissa Kohut on Fri Dec 31, 2013 11:40 AM ------      Message from: [facility]      Created: Fri Dec 31, 2013 10:53 AM       Rx is ready and in drawer                   Needs a refill on his oxycodone. ------

## 2013-12-31 NOTE — Telephone Encounter (Signed)
Patient notified that Rx was ready for pick up

## 2014-01-03 ENCOUNTER — Encounter: Payer: Self-pay | Admitting: *Deleted

## 2014-01-13 ENCOUNTER — Other Ambulatory Visit (HOSPITAL_COMMUNITY): Payer: Self-pay | Admitting: Oncology

## 2014-01-13 DIAGNOSIS — C9000 Multiple myeloma not having achieved remission: Secondary | ICD-10-CM

## 2014-01-13 MED ORDER — GABAPENTIN 600 MG PO TABS: 600.0000 mg | ORAL_TABLET | Freq: Three times a day (TID) | ORAL | Status: DC

## 2014-01-18 ENCOUNTER — Other Ambulatory Visit (HOSPITAL_COMMUNITY): Payer: Self-pay | Admitting: Oncology

## 2014-01-18 DIAGNOSIS — C9000 Multiple myeloma not having achieved remission: Secondary | ICD-10-CM

## 2014-01-18 MED ORDER — LENALIDOMIDE 10 MG PO CAPS: 10.0000 mg | ORAL_CAPSULE | Freq: Every day | ORAL | Status: DC

## 2014-01-24 DIAGNOSIS — H2510 Age-related nuclear cataract, unspecified eye: Secondary | ICD-10-CM | POA: Diagnosis not present

## 2014-01-24 DIAGNOSIS — H524 Presbyopia: Secondary | ICD-10-CM | POA: Diagnosis not present

## 2014-01-24 DIAGNOSIS — H52229 Regular astigmatism, unspecified eye: Secondary | ICD-10-CM | POA: Diagnosis not present

## 2014-01-24 DIAGNOSIS — H5200 Hypermetropia, unspecified eye: Secondary | ICD-10-CM | POA: Diagnosis not present

## 2014-01-25 ENCOUNTER — Ambulatory Visit: Payer: Medicare Other | Admitting: Vascular Surgery

## 2014-01-25 ENCOUNTER — Other Ambulatory Visit: Payer: Medicare Other

## 2014-01-26 ENCOUNTER — Encounter (HOSPITAL_COMMUNITY): Payer: Medicare Other | Attending: Oncology

## 2014-01-26 ENCOUNTER — Encounter (HOSPITAL_BASED_OUTPATIENT_CLINIC_OR_DEPARTMENT_OTHER): Payer: Medicare Other | Admitting: Oncology

## 2014-01-26 VITALS — BP 134/72 | HR 60 | Temp 97.4°F | Resp 18 | Wt 194.8 lb

## 2014-01-26 DIAGNOSIS — D649 Anemia, unspecified: Secondary | ICD-10-CM

## 2014-01-26 DIAGNOSIS — D801 Nonfamilial hypogammaglobulinemia: Secondary | ICD-10-CM

## 2014-01-26 DIAGNOSIS — R600 Localized edema: Secondary | ICD-10-CM

## 2014-01-26 DIAGNOSIS — C9000 Multiple myeloma not having achieved remission: Secondary | ICD-10-CM

## 2014-01-26 DIAGNOSIS — C8589 Other specified types of non-Hodgkin lymphoma, extranodal and solid organ sites: Secondary | ICD-10-CM | POA: Diagnosis not present

## 2014-01-26 DIAGNOSIS — R609 Edema, unspecified: Secondary | ICD-10-CM | POA: Diagnosis not present

## 2014-01-26 LAB — COMPREHENSIVE METABOLIC PANEL
ALT: 16 U/L (ref 0–53)
AST: 22 U/L (ref 0–37)
Albumin: 3.5 g/dL (ref 3.5–5.2)
Alkaline Phosphatase: 83 U/L (ref 39–117)
BILIRUBIN TOTAL: 0.4 mg/dL (ref 0.3–1.2)
BUN: 14 mg/dL (ref 6–23)
CHLORIDE: 105 meq/L (ref 96–112)
CO2: 27 meq/L (ref 19–32)
CREATININE: 0.9 mg/dL (ref 0.50–1.35)
Calcium: 9.2 mg/dL (ref 8.4–10.5)
GFR calc Af Amer: >90 mL/min (ref >90)
GFR calc non Af Amer: 84 mL/min — ABNORMAL LOW (ref >90)
Glucose, Bld: 104 mg/dL — ABNORMAL HIGH (ref 70–99)
Potassium: 4.2 mEq/L (ref 3.7–5.3)
Sodium: 140 mEq/L (ref 137–147)
Total Protein: 6.8 g/dL (ref 6.0–8.3)

## 2014-01-26 LAB — CBC WITH DIFFERENTIAL/PLATELET
Basophils Absolute: 0.1 10*3/uL (ref 0.0–0.1)
Basophils Relative: 1 % (ref 0–1)
Eosinophils Absolute: 0.1 10*3/uL (ref 0.0–0.7)
Eosinophils Relative: 2 % (ref 0–5)
HEMATOCRIT: 36.3 % — AB (ref 39.0–52.0)
Hemoglobin: 11.9 g/dL — ABNORMAL LOW (ref 13.0–17.0)
LYMPHS ABS: 1.8 10*3/uL (ref 0.7–4.0)
LYMPHS PCT: 39 % (ref 12–46)
MCH: 29.3 pg (ref 26.0–34.0)
MCHC: 32.8 g/dL (ref 30.0–36.0)
MCV: 89.4 fL (ref 78.0–100.0)
MONO ABS: 0.6 10*3/uL (ref 0.1–1.0)
Monocytes Relative: 13 % — ABNORMAL HIGH (ref 3–12)
Neutro Abs: 2 10*3/uL (ref 1.7–7.7)
Neutrophils Relative %: 44 % (ref 43–77)
Platelets: 146 10*3/uL — ABNORMAL LOW (ref 150–400)
RBC: 4.06 MIL/uL — AB (ref 4.22–5.81)
RDW: 15.2 % (ref 11.5–15.5)
WBC: 4.5 10*3/uL (ref 4.0–10.5)

## 2014-01-26 LAB — LACTATE DEHYDROGENASE: LDH: 157 U/L (ref 94–250)

## 2014-01-26 LAB — C-REACTIVE PROTEIN: CRP: <0.5 mg/dL — ABNORMAL LOW (ref <0.60)

## 2014-01-26 MED ORDER — SODIUM CHLORIDE 0.9 % IV SOLN: Freq: Once | INTRAVENOUS | Status: DC

## 2014-01-26 MED ORDER — HEPARIN SOD (PORK) LOCK FLUSH 100 UNIT/ML IV SOLN
500.0000 [IU] | Freq: Once | INTRAVENOUS | Status: AC | PRN
  Administered 2014-01-26: 500 [IU]
  Filled 2014-01-26: qty 5

## 2014-01-26 MED ORDER — IMMUNE GLOBULIN (HUMAN) 20 GM/200ML IV SOLN
35.0000 g | Freq: Once | INTRAVENOUS | Status: AC
  Administered 2014-01-26: 35 g via INTRAVENOUS
  Filled 2014-01-26: qty 200

## 2014-01-26 MED ORDER — IMMUNE GLOBULIN (HUMAN) 20 GM/200ML IV SOLN: 0.4000 g/kg | Freq: Once | INTRAVENOUS | Status: DC

## 2014-01-26 MED ORDER — ZOLEDRONIC ACID 4 MG/5ML IV CONC
4.0000 mg | Freq: Once | INTRAVENOUS | Status: AC
  Administered 2014-01-26: 4 mg via INTRAVENOUS
  Filled 2014-01-26: qty 5

## 2014-01-26 NOTE — Progress Notes (Signed)
Patient is seen as a work-in today.  He is here for IVIG infusion.  He notes B/L LE edema that on exam today are trace to 1 + pitting edema.  He denies any pain.  He notes that they are worse sometime.  I recommended LE elevation when resting.  I think the swelling is too minimal to be aggressive with and conservative treatment should suffice.  It is noted that Gabapentin can cause LE edema and we had a discussion about this.  He wishes to decrease his Gabapentin to 2 pills daily (compared to 3 daily) to see if that helps with his swelling.  I have discouraged stopping the medication abruptly.  [name] asks if he can have his iron level checked.  We have never checked a ferritin on his (at least since 2012 when [facility] was implemented).  His minimally anemic.  Iron deficiency is not on our differential diagnosis list for his anemia, as his multiple myeloma and its treatment, his history of lymphoma and its treatment, including bone marrow transplantation, are most likely the cause of his minimal-mild anemia.  However, we will order a Ferritin to make sure that it at a good level.  Patient and plan discussed with Dr. Farrel Gobble and he is in agreement with the aforementioned.    KEFALAS,THOMAS

## 2014-01-27 LAB — KAPPA/LAMBDA LIGHT CHAINS
KAPPA FREE LGHT CHN: 1.4 mg/dL (ref 0.33–1.94)
Kappa, lambda light chain ratio: 0.9 (ref 0.26–1.65)
LAMDA FREE LIGHT CHAINS: 1.55 mg/dL (ref 0.57–2.63)

## 2014-01-27 LAB — FERRITIN: FERRITIN: 34 ng/mL (ref 22–322)

## 2014-01-28 ENCOUNTER — Other Ambulatory Visit (HOSPITAL_COMMUNITY): Payer: Self-pay | Admitting: Oncology

## 2014-01-28 DIAGNOSIS — E876 Hypokalemia: Secondary | ICD-10-CM

## 2014-01-28 DIAGNOSIS — R790 Abnormal level of blood mineral: Secondary | ICD-10-CM

## 2014-01-28 LAB — MULTIPLE MYELOMA PANEL, SERUM
ALPHA-1-GLOBULIN: 4.8 % (ref 2.9–4.9)
ALPHA-2-GLOBULIN: 10.7 % (ref 7.1–11.8)
Albumin ELP: 56.1 % (ref 55.8–66.1)
Beta 2: 5.5 % (ref 3.2–6.5)
Beta Globulin: 6.9 % (ref 4.7–7.2)
Gamma Globulin: 16 % (ref 11.1–18.8)
IgA: 230 mg/dL (ref 68–379)
IgG (Immunoglobin G), Serum: 913 mg/dL (ref 650–1600)
IgM, Serum: 19 mg/dL — ABNORMAL LOW (ref 41–251)
M-Spike, %: NOT DETECTED g/dL
Total Protein: 6 g/dL (ref 6.0–8.3)

## 2014-01-28 LAB — BETA 2 MICROGLOBULIN, SERUM: Beta-2 Microglobulin: 2.58 mg/L — ABNORMAL HIGH (ref <=2.51)

## 2014-01-28 MED ORDER — POTASSIUM CHLORIDE CRYS ER 20 MEQ PO TBCR: 20.0000 meq | EXTENDED_RELEASE_TABLET | Freq: Three times a day (TID) | ORAL | Status: DC

## 2014-01-29 ENCOUNTER — Other Ambulatory Visit: Payer: Self-pay | Admitting: Family Medicine

## 2014-02-07 ENCOUNTER — Other Ambulatory Visit (HOSPITAL_COMMUNITY): Payer: Self-pay | Admitting: Oncology

## 2014-02-07 DIAGNOSIS — C9000 Multiple myeloma not having achieved remission: Secondary | ICD-10-CM

## 2014-02-07 MED ORDER — GABAPENTIN 600 MG PO TABS: 600.0000 mg | ORAL_TABLET | Freq: Three times a day (TID) | ORAL | Status: DC

## 2014-02-08 ENCOUNTER — Other Ambulatory Visit (HOSPITAL_COMMUNITY): Payer: Self-pay | Admitting: Oncology

## 2014-02-08 DIAGNOSIS — C9000 Multiple myeloma not having achieved remission: Secondary | ICD-10-CM

## 2014-02-08 MED ORDER — GABAPENTIN 600 MG PO TABS: 600.0000 mg | ORAL_TABLET | Freq: Three times a day (TID) | ORAL | Status: DC

## 2014-02-14 ENCOUNTER — Other Ambulatory Visit (HOSPITAL_COMMUNITY): Payer: Self-pay | Admitting: Oncology

## 2014-02-14 DIAGNOSIS — C9000 Multiple myeloma not having achieved remission: Secondary | ICD-10-CM

## 2014-02-14 MED ORDER — LENALIDOMIDE 10 MG PO CAPS: 10.0000 mg | ORAL_CAPSULE | Freq: Every day | ORAL | Status: DC

## 2014-02-15 DIAGNOSIS — M67919 Unspecified disorder of synovium and tendon, unspecified shoulder: Secondary | ICD-10-CM | POA: Diagnosis not present

## 2014-02-17 ENCOUNTER — Telehealth: Payer: Self-pay | Admitting: Cardiovascular Disease

## 2014-02-17 DIAGNOSIS — R351 Nocturia: Secondary | ICD-10-CM | POA: Diagnosis not present

## 2014-02-17 DIAGNOSIS — N320 Bladder-neck obstruction: Secondary | ICD-10-CM | POA: Diagnosis not present

## 2014-02-17 DIAGNOSIS — Z8546 Personal history of malignant neoplasm of prostate: Secondary | ICD-10-CM | POA: Diagnosis not present

## 2014-02-17 NOTE — Telephone Encounter (Signed)
[facility]  In [place] [address] is asking for a new prescription for Jeremy Parrish Isosorbide mononitrate(Imdur) in order to fill his prescription .Marland Kitchen Please call if you have any questions   Thanks

## 2014-02-17 NOTE — Telephone Encounter (Signed)
Returned call and pt verified x 2.  Pt informed message received and asked if he had contacted pharmacy.  Pt stated he did and was told they didn't have any record of the medication.  Pt informed script was sent on 1.15.15 w/ 6 refills and confirmation received.  Pt verbalized understanding and stated he will call them now.

## 2014-02-23 ENCOUNTER — Encounter (HOSPITAL_COMMUNITY): Payer: Self-pay

## 2014-02-23 ENCOUNTER — Encounter (HOSPITAL_BASED_OUTPATIENT_CLINIC_OR_DEPARTMENT_OTHER): Payer: Medicare Other

## 2014-02-23 ENCOUNTER — Encounter (HOSPITAL_COMMUNITY): Payer: Medicare Other | Attending: Oncology

## 2014-02-23 VITALS — BP 122/70 | HR 63 | Temp 97.0°F | Resp 18

## 2014-02-23 VITALS — BP 126/70 | HR 61 | Temp 97.4°F | Resp 18 | Wt 191.9 lb

## 2014-02-23 DIAGNOSIS — R093 Abnormal sputum: Secondary | ICD-10-CM | POA: Diagnosis not present

## 2014-02-23 DIAGNOSIS — G8190 Hemiplegia, unspecified affecting unspecified side: Secondary | ICD-10-CM | POA: Insufficient documentation

## 2014-02-23 DIAGNOSIS — C9001 Multiple myeloma in remission: Secondary | ICD-10-CM | POA: Diagnosis not present

## 2014-02-23 DIAGNOSIS — D801 Nonfamilial hypogammaglobulinemia: Secondary | ICD-10-CM | POA: Diagnosis not present

## 2014-02-23 DIAGNOSIS — Z8551 Personal history of malignant neoplasm of bladder: Secondary | ICD-10-CM | POA: Insufficient documentation

## 2014-02-23 DIAGNOSIS — C8589 Other specified types of non-Hodgkin lymphoma, extranodal and solid organ sites: Secondary | ICD-10-CM

## 2014-02-23 DIAGNOSIS — M810 Age-related osteoporosis without current pathological fracture: Secondary | ICD-10-CM | POA: Diagnosis not present

## 2014-02-23 DIAGNOSIS — R790 Abnormal level of blood mineral: Secondary | ICD-10-CM

## 2014-02-23 DIAGNOSIS — G8191 Hemiplegia, unspecified affecting right dominant side: Secondary | ICD-10-CM

## 2014-02-23 DIAGNOSIS — C9000 Multiple myeloma not having achieved remission: Secondary | ICD-10-CM

## 2014-02-23 LAB — COMPREHENSIVE METABOLIC PANEL
ALK PHOS: 89 U/L (ref 39–117)
ALT: 19 U/L (ref 0–53)
AST: 17 U/L (ref 0–37)
Albumin: 3.4 g/dL — ABNORMAL LOW (ref 3.5–5.2)
BILIRUBIN TOTAL: 0.4 mg/dL (ref 0.3–1.2)
BUN: 11 mg/dL (ref 6–23)
CHLORIDE: 104 meq/L (ref 96–112)
CO2: 28 mEq/L (ref 19–32)
Calcium: 9.5 mg/dL (ref 8.4–10.5)
Creatinine, Ser: 0.85 mg/dL (ref 0.50–1.35)
GFR calc Af Amer: >90 mL/min (ref >90)
GFR calc non Af Amer: 86 mL/min — ABNORMAL LOW (ref >90)
Glucose, Bld: 94 mg/dL (ref 70–99)
Potassium: 4.3 mEq/L (ref 3.7–5.3)
SODIUM: 141 meq/L (ref 137–147)
Total Protein: 6.6 g/dL (ref 6.0–8.3)

## 2014-02-23 LAB — CBC WITH DIFFERENTIAL/PLATELET
Basophils Absolute: 0 10*3/uL (ref 0.0–0.1)
Basophils Relative: 1 % (ref 0–1)
Eosinophils Absolute: 0.2 10*3/uL (ref 0.0–0.7)
Eosinophils Relative: 3 % (ref 0–5)
HCT: 38.4 % — ABNORMAL LOW (ref 39.0–52.0)
HEMOGLOBIN: 12.6 g/dL — AB (ref 13.0–17.0)
LYMPHS ABS: 1.4 10*3/uL (ref 0.7–4.0)
Lymphocytes Relative: 21 % (ref 12–46)
MCH: 29.3 pg (ref 26.0–34.0)
MCHC: 32.8 g/dL (ref 30.0–36.0)
MCV: 89.3 fL (ref 78.0–100.0)
MONOS PCT: 21 % — AB (ref 3–12)
Monocytes Absolute: 1.4 10*3/uL — ABNORMAL HIGH (ref 0.1–1.0)
NEUTROS ABS: 3.6 10*3/uL (ref 1.7–7.7)
NEUTROS PCT: 55 % (ref 43–77)
PLATELETS: 137 10*3/uL — AB (ref 150–400)
RBC: 4.3 MIL/uL (ref 4.22–5.81)
RDW: 15.4 % (ref 11.5–15.5)
WBC: 6.5 10*3/uL (ref 4.0–10.5)

## 2014-02-23 LAB — LACTATE DEHYDROGENASE: LDH: 143 U/L (ref 94–250)

## 2014-02-23 LAB — C-REACTIVE PROTEIN: CRP: <0.5 mg/dL — ABNORMAL LOW (ref <0.60)

## 2014-02-23 MED ORDER — HEPARIN SOD (PORK) LOCK FLUSH 100 UNIT/ML IV SOLN
500.0000 [IU] | Freq: Once | INTRAVENOUS | Status: AC | PRN
  Administered 2014-02-23: 500 [IU]

## 2014-02-23 MED ORDER — ALPRAZOLAM 0.5 MG PO TABS: 0.5000 mg | ORAL_TABLET | Freq: Every evening | ORAL | Status: DC | PRN

## 2014-02-23 MED ORDER — SODIUM CHLORIDE 0.9 % IV SOLN
Freq: Once | INTRAVENOUS | Status: AC
  Administered 2014-02-23: 09:00:00 via INTRAVENOUS

## 2014-02-23 MED ORDER — HEPARIN SOD (PORK) LOCK FLUSH 100 UNIT/ML IV SOLN
INTRAVENOUS | Status: AC
  Filled 2014-02-23: qty 5

## 2014-02-23 MED ORDER — SODIUM CHLORIDE 0.9 % IJ SOLN
10.0000 mL | INTRAMUSCULAR | Status: DC | PRN
  Administered 2014-02-23: 10 mL

## 2014-02-23 MED ORDER — ZOLEDRONIC ACID 4 MG/5ML IV CONC
4.0000 mg | Freq: Once | INTRAVENOUS | Status: AC
  Administered 2014-02-23: 4 mg via INTRAVENOUS
  Filled 2014-02-23: qty 5

## 2014-02-23 MED ORDER — IMMUNE GLOBULIN (HUMAN) 10 GM/100ML IV SOLN: 0.4000 g/kg | Freq: Once | INTRAVENOUS | Status: DC

## 2014-02-23 MED ORDER — IMMUNE GLOBULIN (HUMAN) 20 GM/200ML IV SOLN
35.0000 g | Freq: Once | INTRAVENOUS | Status: AC
  Administered 2014-02-23: 35 g via INTRAVENOUS
  Filled 2014-02-23: qty 200

## 2014-02-23 MED ORDER — ACETAMINOPHEN 325 MG PO TABS: 650.0000 mg | ORAL_TABLET | Freq: Four times a day (QID) | ORAL | Status: DC | PRN

## 2014-02-23 NOTE — Patient Instructions (Signed)
[facility] Discharge Instructions  RECOMMENDATIONS MADE BY THE CONSULTANT AND ANY TEST RESULTS WILL BE SENT TO YOUR REFERRING PHYSICIAN.  EXAM FINDINGS BY THE PHYSICIAN TODAY AND SIGNS OR SYMPTOMS TO REPORT TO CLINIC OR PRIMARY PHYSICIAN: Exam and findings as discussed by Dr. Barnet Glasgow.  Report fevers, chills, unexplained weight loss, and other concerns  MEDICATIONS PRESCRIBED:  Use Flonase daily Xanax - take as prescribed  INSTRUCTIONS/FOLLOW-UP: Labs, IVIG and zometa monthly MD visit in 12 weeks.  Thank you for choosing [facility] to provide your oncology and hematology care.  To afford each patient quality time with our providers, please arrive at least 15 minutes before your scheduled appointment time.  With your help, our goal is to use those 15 minutes to complete the necessary work-up to ensure our physicians have the information they need to help with your evaluation and healthcare recommendations.    Effective January 1st, 2014, we ask that you re-schedule your appointment with our physicians should you arrive 10 or more minutes late for your appointment.  We strive to give you quality time with our providers, and arriving late affects you and other patients whose appointments are after yours.    Again, thank you for choosing [facility].  Our hope is that these requests will decrease the amount of time that you wait before being seen by our physicians.       _____________________________________________________________  Should you have questions after your visit to [facility], please contact our office at [phone] between the hours of 8:30 a.m. and 5:00 p.m.  Voicemails left after 4:30 p.m. will not be returned until the following business day.  For prescription refill requests, have your pharmacy contact our office with your prescription refill request.

## 2014-02-23 NOTE — Progress Notes (Signed)
[place]  OFFICE PROGRESS NOTE  Jeremy Battiest, MD [place] [address]  DIAGNOSIS: Multiple myeloma in remission - Plan: ALPRAZolam (XANAX) 0.5 MG tablet, Soluble transferrin receptor, Soluble transferrin receptor  Hemiplegia affecting right dominant side - Plan: ALPRAZolam (XANAX) 0.5 MG tablet, Soluble transferrin receptor, Soluble transferrin receptor  Hx of bladder cancer - Plan: ALPRAZolam (XANAX) 0.5 MG tablet, Soluble transferrin receptor, Soluble transferrin receptor  LYMPHOMA - Plan: ALPRAZolam (XANAX) 0.5 MG tablet, Soluble transferrin receptor, Soluble transferrin receptor  Chief Complaint  Patient presents with  . 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  INTERVAL HISTORY: Jeremy Parrish 74 y.o. male returns for followup while taking maintenance therapy with Revlimid 10 mg daily for 7 days on and 7 days off after undergoing autologous peripheral blood stem cell transplant for IgG lambda myeloma in October of 2011 at [facility] under the care of Dr. Marcell Parrish. 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 In 2011 he was diagnosed with IgG lambda myeloma and treated with 4 cycles of Velcade and dexamethasone followed by autologous peripheral blood stem cell transplant and currently remains on maintenance Revlimid, and monthly IVIGg. 35 g plus Zometa 4 mg. He is not required antibiotics for the last 3 months. He had nasal drip with no sore throat, earache, cough, or wheezing. He denies any PND, orthopnea, palpitations, does have some residual  peripheral paresthesias but no difficulty in walking without a cane. He denies any fever, night sweats, easy satiety, diarrhea, constipation, melena, hematochezia, hematuria, urinary hesitancy, skin rash, headache, or seizures.  MEDICAL HISTORY: Past Medical History  Diagnosis Date  . Hypertension   . Heart disease   . Kidney stones     history  . Lung mass   . Heart murmur   . 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 Coronary artery disease   . Depression   . Shortness of breath   . Peripheral arterial disease   . Bladder neck contracture   . Personal history of other diseases of circulatory system   . Aortic aneurysm of unspecified site without mention of rupture   . Arthritis   . Intestinovesical fistula   . Esophageal reflux   . Hyperlipidemia   . Anemia   . CHF (congestive heart failure)   . COPD (chronic obstructive pulmonary disease)   . Myocardial infarction   . Cerebral atherosclerosis     Carotid Doppler, 02/16/2013 - Bilateral Proximal ICAs,demonstrate mild plaque w/o evidence of significant diameter reduction, dissection, or any other vascular abnormality  . Complication of anesthesia   . PONV (postoperative nausea and vomiting)   . Stroke 2013    Speech.  . Cancer   . Prostate cancer 2000  . Multiple myeloma   . Hx of bladder cancer 10/07/2012  . 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   IgG lambda multiple myeloma status post 4 cycles of dexamethasone and Velcade followed by bone marrow transplantation by Dr. Marcell Parrish at [facility] October 2011. Now on maintenance Revlimid,.  Jeremy Parrish oncologic history began in 2002 when he was diagnosed with prostate cancer.   He underwent radical prostatectomy shortly thereafter and then continued on cancer surveillance.   In July 2007, he was found to have a new mass lesion in his lung.   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   He received six cycles of R-Parrish with a good response   He once again entered surveillance staging and follow up scans revealed no evidence of disease recurrence.   In September 2009, it was noticed that Jeremy Parrish total protein was elevated at 8.4. This was followed up in 02/2009 with a total protein increased to 9.1 with an albumin of 3.4.   Of note, in October 2009, a PET CT was done, which was unremarkable for any disease recurrence.   In further defining Jeremy Parrish total protein and SPEP and immuno electrophoresis was obtained showing in an M-spike of 3.03.   Quantitative immunoglobulins revealed IgG of 4240 IgA of 24 IgM 32.   A bone survey showed lucent bone lesions in the calvarium proximal left and right humerus and proximal left and right femur.   A bone marrow biopsy and aspiration was then obtained revealing 40% plasma cell  infiltrate with several scattered CD3 positive lymphoid aggregates. Flow cytometry was done which did not show any evidence of abnormal B-cell population or abnormal T-cell phenotype.   TREATMENT HISTORY:   Six cycles of R-Parrish for marginal zone lymphoma with good response.   Four cycles of Velcade, revlimid, and dexamethasone with some peripheral neuropathy noted  Mobilization with Rituxan and Cytoxan (07/2009) followed by peripheral blood stem cell collection.   Autologous peripheral blood stem cell transplant   Preparative regimen: Melphalan 200 mg/m2  Day 0 = 08/24/09. He received 8.86 x 10(6) CD34+ cells/kg   Post transplant maintenance with Revlimid 10 mg 7 days on, 7 days off  Zometa monthly, given locally    ALLERGIES:  is allergic to diphenhydramine hcl; morphine and related; and tape.  MEDICATIONS: has a current medication list which includes the following prescription(s): alprazolam, amlodipine, aspirin, citalopram, clopidogrel, gabapentin, ibuprofen, isosorbide mononitrate, lenalidomide, nitroglycerin, omeprazole, oxycodone-acetaminophen, potassium chloride sa, simvastatin, zoledronic acid, temazepam, and tramadol, and the following Facility-Administered Medications: acetaminophen, heparin lock flush, sodium chloride, and zolendronic acid (ZOMETA) 4 mg in sodium chloride 0.9 % 100 mL IVPB.  SURGICAL HISTORY:  Past Surgical History  Procedure Laterality Date  . Prostate surgery    . Heart stents x 5  1999  . Portacath placement  07/26/2009  . Wrist surgery  right  . Left ear skin cancer removed    . Bone marrow transplant  2011  . Pacemaker insertion  07/22/2011    Medtronic  . Coronary angioplasty  06/24/2000    PCI and stenting in mid & proximal RCA  . Insert / replace / remove pacemaker    . Tee without cardioversion  10/13/2012    Procedure: TRANSESOPHAGEAL ECHOCARDIOGRAM (TEE);  Surgeon: Sanda Klein, MD;  Location: [facility] ENDOSCOPY;  Service: Cardiovascular;   Laterality: N/A;  pat/kay/echo notified  . Colonoscopy N/A 01/01/2013    Procedure: COLONOSCOPY;  Surgeon: Rogene Houston, MD;  Location: [facility] ENDO SUITE;  Service: Endoscopy;  Laterality: N/A;  825-moved to [place] notified pt  . Rotator    . Rotator cuff repair Right   . Colon surgery      colon resection  . Bladder surgery    . Shoulder arthroscopy with subacromial decompression Right 07/21/2013    Procedure: RIGHT SHOULDER ARTHROSCOPY WITH SUBACROMIAL DECOMPRESSION AND DEBRIDEMENT & Injection of Left Shoulder;  Surgeon: Alta Corning, MD;  Location: [place];  Service: Orthopedics;  Laterality: Right;  . US echocardiography  06/19/2011    RV mildly dilated,mild to mod. MR,mild AI,mild PI  . Nm myocar perf wall motion  11/27/2007    inferior scar    FAMILY HISTORY: family history includes Cancer in his brother and father; Heart attack in his sister; Heart disease in his brother, father, and sister; Hyperlipidemia in his sister; Hypertension in his mother and sister. There is no history of Colon cancer or Colon polyps.  SOCIAL HISTORY:  reports that he quit smoking about 19 years ago. His smoking use included Cigarettes. He has a 20 pack-year smoking history. He has never used smokeless tobacco. He reports that he does not drink alcohol or use illicit drugs.  REVIEW OF SYSTEMS:  Other than that discussed above is noncontributory.  PHYSICAL EXAMINATION: ECOG PERFORMANCE STATUS: 1 - Symptomatic but completely ambulatory  Blood pressure 126/70, pulse 61, temperature 97.4 F (36.3 C), temperature source Oral, resp. rate 18, weight 191 lb 14.4 oz (87.045 kg).  GENERAL:alert, no distress and comfortable. Right ear deformity from previous surgery SKIN: skin color, texture, turgor are normal, no rashes or significant lesions EYES: PERLA; Conjunctiva are pink and non-injected, sclera clear SINUSES: No redness or tenderness over maxillary or ethmoid sinuses OROPHARYNX:no exudate, no erythema on lips,  buccal mucosa, or tongue. NECK: supple, thyroid normal size, non-tender, without nodularity. No masses CHEST: Right anterior lifeport in place. Pacemaker in place. Slightly increased AP diameter. LYMPH:  no palpable lymphadenopathy in the cervical, axillary or inguinal LUNGS: clear to auscultation and percussion with normal breathing effort HEART: regular rate & rhythm and no murmurs. ABDOMEN:abdomen soft, non-tender and normal bowel sounds MUSCULOSKELETAL:no cyanosis of digits and no clubbing. Range of motion normal.  NEURO: alert & oriented x 3, a phasic speech with 4/5 strength on the right versus the left.   LABORATORY DATA: Infusion on 02/23/2014  Component Date Value Ref Range Status  . WBC 02/23/2014 6.5  4.0 - 10.5 K/uL Final  . RBC 02/23/2014 4.30  4.22 - 5.81 MIL/uL Final  . Hemoglobin 02/23/2014 12.6* 13.0 - 17.0 g/dL Final  . HCT 02/23/2014 38.4* 39.0 - 52.0 % Final  . MCV 02/23/2014 89.3  78.0 - 100.0 fL Final  . MCH 02/23/2014 29.3  26.0 - 34.0 pg Final  . MCHC 02/23/2014 32.8  30.0 - 36.0 g/dL Final  . RDW 02/23/2014 15.4  11.5 -  15.5 % Final  . Platelets 02/23/2014 137* 150 - 400 K/uL Final  . Neutrophils Relative % 02/23/2014 55  43 - 77 % Final  . Neutro Abs 02/23/2014 3.6  1.7 - 7.7 K/uL Final  . Lymphocytes Relative 02/23/2014 21  12 - 46 % Final  . Lymphs Abs 02/23/2014 1.4  0.7 - 4.0 K/uL Final  . Monocytes Relative 02/23/2014 21* 3 - 12 % Final  . Monocytes Absolute 02/23/2014 1.4* 0.1 - 1.0 K/uL Final  . Eosinophils Relative 02/23/2014 3  0 - 5 % Final  . Eosinophils Absolute 02/23/2014 0.2  0.0 - 0.7 K/uL Final  . Basophils Relative 02/23/2014 1  0 - 1 % Final  . Basophils Absolute 02/23/2014 0.0  0.0 - 0.1 K/uL Final  . Sodium 02/23/2014 141  137 - 147 mEq/L Final  . Potassium 02/23/2014 4.3  3.7 - 5.3 mEq/L Final  . Chloride 02/23/2014 104  96 - 112 mEq/L Final  . CO2 02/23/2014 28  19 - 32 mEq/L Final  . Glucose, Bld 02/23/2014 94  70 - 99 mg/dL Final    . BUN 02/23/2014 11  6 - 23 mg/dL Final  . Creatinine, Ser 02/23/2014 0.85  0.50 - 1.35 mg/dL Final  . Calcium 02/23/2014 9.5  8.4 - 10.5 mg/dL Final  . Total Protein 02/23/2014 6.6  6.0 - 8.3 g/dL Final  . Albumin 02/23/2014 3.4* 3.5 - 5.2 g/dL Final  . AST 02/23/2014 17  0 - 37 U/L Final  . ALT 02/23/2014 19  0 - 53 U/L Final  . Alkaline Phosphatase 02/23/2014 89  39 - 117 U/L Final  . Total Bilirubin 02/23/2014 0.4  0.3 - 1.2 mg/dL Final  . GFR calc non Af Amer 02/23/2014 86* >90 mL/min Final  . GFR calc Af Amer 02/23/2014 >90  >90 mL/min Final   Comment: (NOTE)                          The eGFR has been calculated using the CKD EPI equation.                          This calculation has not been validated in all clinical situations.                          eGFR's persistently <90 mL/min signify possible Chronic Kidney                          Disease.  Marland Kitchen LDH 02/23/2014 143  94 - 250 U/L Final  . Total Protein 02/23/2014 PENDING  6.0 - 8.3 g/dL Incomplete  . Albumin ELP 02/23/2014 PENDING  55.8 - 66.1 % Incomplete  . Alpha-1-Globulin 02/23/2014 PENDING  2.9 - 4.9 % Incomplete  . Alpha-2-Globulin 02/23/2014 PENDING  7.1 - 11.8 % Incomplete  . Beta Globulin 02/23/2014 PENDING  4.7 - 7.2 % Incomplete  . Beta 2 02/23/2014 PENDING  3.2 - 6.5 % Incomplete  . Gamma Globulin 02/23/2014 PENDING  11.1 - 18.8 % Incomplete  . M-Spike, % 02/23/2014 PENDING   Incomplete  . SPE Interp. 02/23/2014 PENDING   Incomplete  . Comment 02/23/2014 PENDING   Incomplete  . IgG (Immunoglobin G), Serum 02/23/2014 PENDING  694 - 1618 mg/dL Incomplete  . IgA 02/23/2014 PENDING  68 - 378 mg/dL Incomplete  . IgM, Serum 02/23/2014 PENDING  60 - 263 mg/dL Incomplete  . Immunofix Electr Int 02/23/2014 PENDING   Incomplete  Infusion on 01/26/2014  Component Date Value Ref Range Status  . WBC 01/26/2014 4.5  4.0 - 10.5 K/uL Final  . RBC 01/26/2014 4.06* 4.22 - 5.81 MIL/uL Final  . Hemoglobin 01/26/2014 11.9*  13.0 - 17.0 g/dL Final  . HCT 01/26/2014 36.3* 39.0 - 52.0 % Final  . MCV 01/26/2014 89.4  78.0 - 100.0 fL Final  . MCH 01/26/2014 29.3  26.0 - 34.0 pg Final  . MCHC 01/26/2014 32.8  30.0 - 36.0 g/dL Final  . RDW 01/26/2014 15.2  11.5 - 15.5 % Final  . Platelets 01/26/2014 146* 150 - 400 K/uL Final  . Neutrophils Relative % 01/26/2014 44  43 - 77 % Final  . Neutro Abs 01/26/2014 2.0  1.7 - 7.7 K/uL Final  . Lymphocytes Relative 01/26/2014 39  12 - 46 % Final  . Lymphs Abs 01/26/2014 1.8  0.7 - 4.0 K/uL Final  . Monocytes Relative 01/26/2014 13* 3 - 12 % Final  . Monocytes Absolute 01/26/2014 0.6  0.1 - 1.0 K/uL Final  . Eosinophils Relative 01/26/2014 2  0 - 5 % Final  . Eosinophils Absolute 01/26/2014 0.1  0.0 - 0.7 K/uL Final  . Basophils Relative 01/26/2014 1  0 - 1 % Final  . Basophils Absolute 01/26/2014 0.1  0.0 - 0.1 K/uL Final  . Sodium 01/26/2014 140  137 - 147 mEq/L Final  . Potassium 01/26/2014 4.2  3.7 - 5.3 mEq/L Final  . Chloride 01/26/2014 105  96 - 112 mEq/L Final  . CO2 01/26/2014 27  19 - 32 mEq/L Final  . Glucose, Bld 01/26/2014 104* 70 - 99 mg/dL Final  . BUN 01/26/2014 14  6 - 23 mg/dL Final  . Creatinine, Ser 01/26/2014 0.90  0.50 - 1.35 mg/dL Final  . Calcium 01/26/2014 9.2  8.4 - 10.5 mg/dL Final  . Total Protein 01/26/2014 6.8  6.0 - 8.3 g/dL Final  . Albumin 01/26/2014 3.5  3.5 - 5.2 g/dL Final  . AST 01/26/2014 22  0 - 37 U/L Final  . ALT 01/26/2014 16  0 - 53 U/L Final  . Alkaline Phosphatase 01/26/2014 83  39 - 117 U/L Final  . Total Bilirubin 01/26/2014 0.4  0.3 - 1.2 mg/dL Final  . GFR calc non Af Amer 01/26/2014 84* >90 mL/min Final  . GFR calc Af Amer 01/26/2014 >90  >90 mL/min Final   Comment: (NOTE)                          The eGFR has been calculated using the CKD EPI equation.                          This calculation has not been validated in all clinical situations.                          eGFR's persistently <90 mL/min signify possible  Chronic Kidney                          Disease.  Marland Kitchen LDH 01/26/2014 157  94 - 250 U/L Final  . Beta-2 Microglobulin 01/26/2014 2.58* <=2.51 mg/L Final   Performed at [facility]  . CRP 01/26/2014 <0.5* <0.60 mg/dL Final   Performed at [facility]  .  Total Protein 01/26/2014 6.0  6.0 - 8.3 g/dL Final  . Albumin ELP 01/26/2014 56.1  55.8 - 66.1 % Final  . Alpha-1-Globulin 01/26/2014 4.8  2.9 - 4.9 % Final  . Alpha-2-Globulin 01/26/2014 10.7  7.1 - 11.8 % Final  . Beta Globulin 01/26/2014 6.9  4.7 - 7.2 % Final  . Beta 2 01/26/2014 5.5  3.2 - 6.5 % Final  . Gamma Globulin 01/26/2014 16.0  11.1 - 18.8 % Final  . M-Spike, % 01/26/2014 NOT DETECTED   Final  . SPE Interp. 01/26/2014 (NOTE)   Final   Comment: The possibility of a faint restricted band(s) cannot be completely                          excluded in the gamma region.                          Results are consistent with SPE performed on 12/30/2013  Reviewed by                          Odis Hollingshead, MD, PhD, FCAP (Electronic Signature on File)  . Comment 01/26/2014 (NOTE)   Final   Comment: ---------------                          Serum protein electrophoresis is a useful screening procedure in the                          detection of various pathophysiologic states such as inflammation,                          gammopathies, protein loss and other dysproteinemias.  Immunofixation                          electrophoresis (IFE) is a more sensitive technique for the                          identification of M-proteins found in patients with monoclonal                          gammopathy of unknown significance (MGUS), amyloidosis, early or                          treated myeloma or macroglobulinemia, solitary plasmacytoma or                          extramedullary plasmacytoma.  . IgG (Immunoglobin G), Serum 01/26/2014 913  650 - 1600 mg/dL Final  . IgA 01/26/2014 230  68 - 379 mg/dL Final  . IgM, Serum  01/26/2014 19* 41 - 251 mg/dL Final  . Immunofix Electr Int 01/26/2014 (NOTE)   Final   Comment: No monoclonal protein identified.                          Reviewed by Odis Hollingshead, MD, PhD, FCAP (Electronic Signature on  File)                          Performed at [facility]  . Kappa free light chain 01/26/2014 1.40  0.33 - 1.94 mg/dL Final  . Lamda free light chains 01/26/2014 1.55  0.57 - 2.63 mg/dL Final  . Kappa, lamda light chain ratio 01/26/2014 0.90  0.26 - 1.65 Final   Performed at [facility]  . Ferritin 01/26/2014 34  22 - 322 ng/mL Final   Performed at [place]: No new pathology.  Urinalysis No results found for this basename: colorurine,  appearanceur,  labspec,  phurine,  glucoseu,  hgbur,  bilirubinur,  ketonesur,  proteinur,  urobilinogen,  nitrite,  leukocytesur    RADIOGRAPHIC STUDIES: No results found.  ASSESSMENT:  #1.IgG lambda multiple myeloma status post 4 cycles of dexamethasone and Velcade followed by bone marrow transplantation by Dr. Marcell Parrish at [facility] October 2011. Now on maintenance Revlimid which he is tolerating well, no evidence of disease..  2. Non-Hodgkin's lymphoma, stage IVb, low-grade B-cell type, symptomatic at presentation in July 2002 to 6 cycles of Jeremy Parrish with a complete response and PET scan in June 2012 showed no evidence for recurrent disease . #3. 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 Left middle cerebral artery thrombosis, manifesting expressive dysphagia and minimal right hemiparesis. #5. Coronary artery disease, status post stent placement x5 in the past, status post pacemaker for heart block. #6. Chronic obstructive pulmonary disease, no longer smoking. #7. Osteoporosis, on treatment with Zometa, calcium, and vitamin D. #8. Cancer involving the right ear, status post excision, no evidence of recurrence, surgery by Dr. Link Snuffer. #9. Episode of cold  vesicular fistula, status post repair with no evidence of recurrence. #10. History of bladder cancer, status post TURBT.    PLAN:  #1. Continue Revlimid 10 mg daily 7 days on and 7 days off. #2. Zometa 4 mg intravenously to be repeated monthly. #3. Intravenous gammaglobulin 35 g monthly. #4. Followup in 3 months with myeloma panel. #5. Followup at [name] with Dr. Marcell Parrish in October 2015.   All questions were answered. The patient knows to call the clinic with any problems, questions or concerns. We can certainly see the patient much sooner if necessary.   I spent 25 minutes counseling the patient face to face. The total time spent in the appointment was 30 minutes.    Farrel Gobble, MD 02/23/2014 10:11 AM

## 2014-02-24 LAB — KAPPA/LAMBDA LIGHT CHAINS
KAPPA FREE LGHT CHN: 1.91 mg/dL (ref 0.33–1.94)
Kappa, lambda light chain ratio: 0.98 (ref 0.26–1.65)
Lambda free light chains: 1.94 mg/dL (ref 0.57–2.63)

## 2014-02-25 LAB — MULTIPLE MYELOMA PANEL, SERUM
ALPHA-2-GLOBULIN: 12 % — AB (ref 7.1–11.8)
Albumin ELP: 58.8 % (ref 55.8–66.1)
Alpha-1-Globulin: 4.7 % (ref 2.9–4.9)
Beta 2: 3.8 % (ref 3.2–6.5)
Beta Globulin: 6.9 % (ref 4.7–7.2)
Gamma Globulin: 13.8 % (ref 11.1–18.8)
IGG (IMMUNOGLOBIN G), SERUM: 878 mg/dL (ref 650–1600)
IgA: 198 mg/dL (ref 68–379)
IgM, Serum: 25 mg/dL — ABNORMAL LOW (ref 41–251)
M-SPIKE, %: NOT DETECTED g/dL
TOTAL PROTEIN: 6.6 g/dL (ref 6.0–8.3)

## 2014-02-25 LAB — BETA 2 MICROGLOBULIN, SERUM: Beta-2 Microglobulin: 2.49 mg/L — ABNORMAL HIGH (ref <=2.51)

## 2014-02-28 ENCOUNTER — Ambulatory Visit (HOSPITAL_COMMUNITY): Payer: Medicare Other

## 2014-03-01 ENCOUNTER — Encounter (HOSPITAL_BASED_OUTPATIENT_CLINIC_OR_DEPARTMENT_OTHER): Payer: Medicare Other | Admitting: Oncology

## 2014-03-01 DIAGNOSIS — R093 Abnormal sputum: Secondary | ICD-10-CM | POA: Diagnosis not present

## 2014-03-01 DIAGNOSIS — R059 Cough, unspecified: Secondary | ICD-10-CM

## 2014-03-01 DIAGNOSIS — R05 Cough: Secondary | ICD-10-CM | POA: Diagnosis not present

## 2014-03-01 MED ORDER — AZITHROMYCIN 250 MG PO TABS: ORAL_TABLET | ORAL | Status: DC

## 2014-03-01 NOTE — Progress Notes (Signed)
Patient is seen as a walk-in  He asks for an antibiotic.  He denies any fevers or chills.  He notes green sputum with coughing however.  Given his sputum description, it is reasonable to give him a quick course of antibiotics.  I will give him a Z-Pak.  He is agreeable.  He looks well.   Patient and plan discussed with Dr. Farrel Gobble and he is in agreement with the aforementioned.   [facility] 03/01/2014

## 2014-03-02 LAB — SOLUBLE TRANSFERRIN RECEPTOR: Transferrin Receptor, Soluble: 1.76 mg/L (ref 0.76–1.76)

## 2014-03-03 ENCOUNTER — Encounter: Payer: Self-pay | Admitting: Cardiovascular Disease

## 2014-03-03 ENCOUNTER — Ambulatory Visit (INDEPENDENT_AMBULATORY_CARE_PROVIDER_SITE_OTHER): Payer: Medicare Other | Admitting: Cardiovascular Disease

## 2014-03-03 VITALS — BP 118/72 | HR 57 | Ht 67.5 in | Wt 192.0 lb

## 2014-03-03 DIAGNOSIS — I2510 Atherosclerotic heart disease of native coronary artery without angina pectoris: Secondary | ICD-10-CM | POA: Diagnosis not present

## 2014-03-03 DIAGNOSIS — R0609 Other forms of dyspnea: Secondary | ICD-10-CM

## 2014-03-03 DIAGNOSIS — R0989 Other specified symptoms and signs involving the circulatory and respiratory systems: Secondary | ICD-10-CM | POA: Diagnosis not present

## 2014-03-03 DIAGNOSIS — Z79899 Other long term (current) drug therapy: Secondary | ICD-10-CM

## 2014-03-03 DIAGNOSIS — E785 Hyperlipidemia, unspecified: Secondary | ICD-10-CM

## 2014-03-03 DIAGNOSIS — R0683 Snoring: Secondary | ICD-10-CM

## 2014-03-03 DIAGNOSIS — I10 Essential (primary) hypertension: Secondary | ICD-10-CM

## 2014-03-03 NOTE — Progress Notes (Addendum)
03/03/2014 Jeremy Parrish   Apr 29, 1941  [id number]  Primary Physician [name], MD Primary Cardiologist: Lorretta Harp MD [name]   HPI:  The patient is a delightful 73 year old, mildly overweight, married Caucasian male, father of 77, grandfather to 4 grandchildren who I saw back in August of last year. He has a history of CAD dating back to 69. I intervened on him multiple times and have put stents in his RCA and circumflex coronary arteries. His last cath performed Apr 10, 2006, revealed patent stents with a 30% proximal LAD lesion and an EF of 45%. He does have chronic dyspnea but denies chest pain. His other problems include hypertension and hyperlipidemia. In addition, he does have bladder cancer as well as lymphoma, myeloma. He has had a partial colectomy. 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 Sallyanne Kuster with improvement in his symptoms. He was last seen in the office for interrogation January 02, 2012  I saw him one year ago. He complains of increasing dyspnea and occasional chest pain. His last 2-D echo was performed 2 years ago and he had a hypokinetic lateral wall of his left Myoview stress test was performed 11/27/07. He is scheduled for right rotator cuff surgery in the near future. I obtained a Myoview stress test a 2-D echo revealing inferolateral scar unchanged from prior functional studies and moderately reduced LVEF of 35-40%.based on the results of his Myoview I cleared him for his rotator cuff surgery At  moderately increased risk. He saw Kerin Ransom back in the office in January complaining of chest pain. Again the Myoview was intermediate risk because of scar with moderate LV dysfunction. He was placed on by mouth Imdur resulting in improvement in his chest pain. Because of  symptoms of obstructive sleep apnea I am going to order a sleep study.  Current Outpatient Prescriptions  Medication Sig Dispense Refill  . ALPRAZolam (XANAX) 0.5 MG tablet Take 1 tablet (0.5 mg total) by mouth at bedtime as needed for anxiety.  30 tablet  5  . amLODipine (NORVASC) 10 MG tablet Take 10 mg by mouth daily.        Marland Kitchen aspirin 81 MG tablet Take 81 mg by mouth daily.      Marland Kitchen azithromycin (ZITHROMAX) 250 MG tablet As directed  6 each  0  . citalopram (CELEXA) 40 MG tablet TAKE 1 TABLET BY MOUTH EVERY DAY  90 tablet  0  . clopidogrel (PLAVIX) 75 MG tablet TAKE 1 TABLET BY MOUTH EVERY DAY  90 tablet  3  . gabapentin (NEURONTIN) 600 MG tablet Take 1 tablet (600 mg total) by mouth 3 (three) times daily.  90 tablet  2  . ibuprofen (ADVIL,MOTRIN) 200 MG tablet Take 200 mg by mouth every 6 (six) hours as needed.      . isosorbide mononitrate (IMDUR) 30 MG 24 hr tablet Take 1 tablet (30 mg total) by mouth daily.  30 tablet  6  . lenalidomide (REVLIMID) 10 MG capsule Take 1 capsule (10 mg total) by mouth daily. Take 1 capsule by mouth daily for 7 days on followed by 7 days off  14 capsule  0  . nitroGLYCERIN (NITROSTAT) 0.4 MG SL tablet Place 1 tablet (0.4 mg total) under the tongue every 5 (five) minutes as needed for chest pain.  25 tablet  12  . omeprazole (PRILOSEC) 20 MG capsule Take 1 capsule (20 mg total) by mouth daily.  90 capsule  1  . oxyCODONE-acetaminophen (PERCOCET/ROXICET) 5-325 MG per tablet Take 1 tablet by mouth every 6 (six) hours as needed.  60 tablet  0  . potassium chloride SA (KLOR-CON M20) 20 MEQ tablet Take 1 tablet (20 mEq total) by mouth 3 (three) times daily.  90 tablet  1  . simvastatin (ZOCOR) 20 MG tablet Take 20 mg by mouth at bedtime.        . temazepam (RESTORIL) 15 MG capsule Take 1-2 capsules (15-30 mg total) by mouth at bedtime as needed for sleep.  60 capsule  0  . traMADol (ULTRAM) 50 MG tablet Take 50 mg by mouth every 6 (six) hours as needed.      .  Zoledronic Acid (ZOMETA) 4 MG/100ML IVPB Inject 4 mg into the vein every 30 (thirty) days.       No current facility-administered medications for this visit.    Allergies  Allergen Reactions  . Diphenhydramine Hcl     REACTION: "hyper"  . Morphine And Related Other (See Comments)    hallucinations  . Tape Rash    Paper tape is ok    History   Social History  . Marital Status: Married    Spouse Name: [name]    Number of Children: 3  . Years of Education: 9th   Occupational History  . retired     Social History Main Topics  . Smoking status: Former Smoker -- 1.00 packs/day for 20 years    Types: Cigarettes    Quit date: 11/14/1994  . Smokeless tobacco: Never Used  . Alcohol Use: No     Comment: previously drank but none for at least 15 years.  . Drug Use: No  . Sexual Activity: Not on file   Other Topics Concern  . Not on file   Social History Narrative   Patient lives at home spouse.   Caffeine Use: Occasionally     Review of Systems: General: negative for chills, fever, night sweats or weight changes.  Cardiovascular: negative for chest pain, dyspnea on exertion, edema, orthopnea, palpitations, paroxysmal nocturnal dyspnea or shortness of breath Dermatological: negative for rash Respiratory: negative for cough or wheezing Urologic: negative for hematuria Abdominal: negative for nausea, vomiting, diarrhea, bright red blood per rectum, melena, or hematemesis Neurologic: negative for visual changes, syncope, or dizziness All other systems reviewed and are otherwise negative except as noted above.    Blood pressure 118/72, pulse 57, height 5' 7.5" (1.715 m), weight 192 lb (87.091 kg).  General appearance: alert and no distress Neck: no adenopathy, no carotid bruit, no JVD, supple, symmetrical, trachea midline and thyroid not enlarged, symmetric, no tenderness/mass/nodules Lungs: clear to auscultation bilaterally Heart: regular rate and rhythm, S1, S2 normal,  no murmur, click, rub or gallop Extremities: extremities normal, atraumatic, no cyanosis or edema  EKG atrially paced rhythm at 68 occasional ventricular paced beats  ASSESSMENT AND PLAN:   CAD, dating to 1996 with multiple PCIs, Stents to RCA and LCX, Last cath 2007 patent stents,30% prox LAD lesion, EF 45% Story of CAD status post stent dating back to 1996. I kept him 06/24/00 demonstrating a patent stent in his AV groove circumflex. He had a new stent placed in his mid dominant RCA with an EF of 50% at that time. He was recathed in 2007 revealing patent stents with an EF of 45%.  He has complained of some chest pain and shortness of breath. His most recent Myoview performed in January showed lateral scar with an EF in the 35-40% range. He was recently seen by Kerin Ransom PAC back in January and was placed on by mouth Imdur,and has only had 2 episodes of chest pain since.  H/O cardiac pacemaker, Medtronic REVO, MRI conditional device, placed 07/2011 for sympyomatic bradycardia Medtronic pacemaker placed 07/2011, followed by Dr. Sallyanne Kuster  HYPERTENSION Controlled on current medications  Hyperlipidemia LDL goal < 100 On statin therapy. We will recheck a lipid and liver profile      Lorretta Harp MD [facility], [facility] 03/03/2014 10:03 AM

## 2014-03-03 NOTE — Assessment & Plan Note (Signed)
On statin therapy. We will recheck a lipid and liver profile 

## 2014-03-03 NOTE — Assessment & Plan Note (Signed)
Controlled on current medications 

## 2014-03-03 NOTE — Patient Instructions (Signed)
  We will see you back in follow up in 6 months with Kerin Ransom and 1 year with Dr Gwenlyn Found.   Dr Gwenlyn Found has ordered : 1. Sleep study 2. Blood work to check your cholesterol

## 2014-03-03 NOTE — Assessment & Plan Note (Signed)
Medtronic pacemaker placed 07/2011, followed by Dr. Sallyanne Kuster

## 2014-03-03 NOTE — Assessment & Plan Note (Signed)
Story of CAD status post stent dating back to 1996. I kept him 06/24/00 demonstrating a patent stent in his AV groove circumflex. He had a new stent placed in his mid dominant RCA with an EF of 50% at that time. He was recathed in 2007 revealing patent stents with an EF of 45%. He has complained of some chest pain and shortness of breath. His most recent Myoview performed in January showed lateral scar with an EF in the 35-40% range. He was recently seen by Kerin Ransom PAC back in January and was placed on by mouth Imdur,and has only had 2 episodes of chest pain since.

## 2014-03-09 ENCOUNTER — Other Ambulatory Visit: Payer: Self-pay | Admitting: *Deleted

## 2014-03-09 DIAGNOSIS — R0683 Snoring: Secondary | ICD-10-CM

## 2014-03-09 DIAGNOSIS — M19019 Primary osteoarthritis, unspecified shoulder: Secondary | ICD-10-CM | POA: Diagnosis not present

## 2014-03-10 ENCOUNTER — Other Ambulatory Visit (HOSPITAL_COMMUNITY): Payer: Self-pay

## 2014-03-10 DIAGNOSIS — G4730 Sleep apnea, unspecified: Secondary | ICD-10-CM

## 2014-03-14 ENCOUNTER — Other Ambulatory Visit (HOSPITAL_COMMUNITY): Payer: Self-pay | Admitting: Oncology

## 2014-03-14 DIAGNOSIS — M12812 Other specific arthropathies, not elsewhere classified, left shoulder: Secondary | ICD-10-CM

## 2014-03-14 DIAGNOSIS — M75101 Unspecified rotator cuff tear or rupture of right shoulder, not specified as traumatic: Secondary | ICD-10-CM

## 2014-03-14 DIAGNOSIS — M12811 Other specific arthropathies, not elsewhere classified, right shoulder: Secondary | ICD-10-CM

## 2014-03-14 DIAGNOSIS — C9000 Multiple myeloma not having achieved remission: Secondary | ICD-10-CM

## 2014-03-14 DIAGNOSIS — M75102 Unspecified rotator cuff tear or rupture of left shoulder, not specified as traumatic: Secondary | ICD-10-CM

## 2014-03-14 DIAGNOSIS — C8589 Other specified types of non-Hodgkin lymphoma, extranodal and solid organ sites: Secondary | ICD-10-CM

## 2014-03-14 MED ORDER — OXYCODONE-ACETAMINOPHEN 5-325 MG PO TABS: 1.0000 | ORAL_TABLET | Freq: Four times a day (QID) | ORAL | Status: DC | PRN

## 2014-03-15 ENCOUNTER — Encounter: Payer: Self-pay | Admitting: Neurology

## 2014-03-15 ENCOUNTER — Ambulatory Visit: Payer: Medicare Other | Attending: Cardiovascular Disease | Admitting: Sleep Medicine

## 2014-03-15 VITALS — Ht 68.5 in | Wt 195.0 lb

## 2014-03-15 DIAGNOSIS — R0683 Snoring: Secondary | ICD-10-CM

## 2014-03-15 DIAGNOSIS — G4733 Obstructive sleep apnea (adult) (pediatric): Secondary | ICD-10-CM | POA: Insufficient documentation

## 2014-03-15 DIAGNOSIS — G4710 Hypersomnia, unspecified: Secondary | ICD-10-CM | POA: Diagnosis not present

## 2014-03-16 ENCOUNTER — Other Ambulatory Visit (HOSPITAL_COMMUNITY): Payer: Self-pay | Admitting: Oncology

## 2014-03-16 DIAGNOSIS — C9000 Multiple myeloma not having achieved remission: Secondary | ICD-10-CM

## 2014-03-16 MED ORDER — LENALIDOMIDE 10 MG PO CAPS: 10.0000 mg | ORAL_CAPSULE | Freq: Every day | ORAL | Status: DC

## 2014-03-16 NOTE — Sleep Study (Signed)
Pt met split night and both CPAP and BiLevel therapy were tried for pt

## 2014-03-23 ENCOUNTER — Encounter (HOSPITAL_BASED_OUTPATIENT_CLINIC_OR_DEPARTMENT_OTHER): Payer: Medicare Other

## 2014-03-23 ENCOUNTER — Encounter (HOSPITAL_COMMUNITY): Payer: Medicare Other | Attending: Oncology

## 2014-03-23 ENCOUNTER — Other Ambulatory Visit (HOSPITAL_COMMUNITY): Payer: Self-pay | Admitting: Oncology

## 2014-03-23 VITALS — BP 134/65 | HR 60 | Temp 98.1°F | Resp 16 | Wt 191.7 lb

## 2014-03-23 DIAGNOSIS — C8589 Other specified types of non-Hodgkin lymphoma, extranodal and solid organ sites: Secondary | ICD-10-CM | POA: Diagnosis not present

## 2014-03-23 DIAGNOSIS — C9000 Multiple myeloma not having achieved remission: Secondary | ICD-10-CM | POA: Diagnosis not present

## 2014-03-23 DIAGNOSIS — E785 Hyperlipidemia, unspecified: Secondary | ICD-10-CM | POA: Diagnosis not present

## 2014-03-23 DIAGNOSIS — D61818 Other pancytopenia: Secondary | ICD-10-CM | POA: Diagnosis not present

## 2014-03-23 DIAGNOSIS — D801 Nonfamilial hypogammaglobulinemia: Secondary | ICD-10-CM | POA: Diagnosis not present

## 2014-03-23 DIAGNOSIS — Z79899 Other long term (current) drug therapy: Secondary | ICD-10-CM | POA: Diagnosis not present

## 2014-03-23 LAB — LACTATE DEHYDROGENASE: LDH: 167 U/L (ref 94–250)

## 2014-03-23 LAB — COMPREHENSIVE METABOLIC PANEL
ALK PHOS: 76 U/L (ref 39–117)
ALT: 17 U/L (ref 0–53)
AST: 23 U/L (ref 0–37)
Albumin: 3.8 g/dL (ref 3.5–5.2)
BILIRUBIN TOTAL: 0.5 mg/dL (ref 0.3–1.2)
BUN: 17 mg/dL (ref 6–23)
CHLORIDE: 105 meq/L (ref 96–112)
CO2: 27 mEq/L (ref 19–32)
Calcium: 9.9 mg/dL (ref 8.4–10.5)
Creatinine, Ser: 0.97 mg/dL (ref 0.50–1.35)
GFR calc Af Amer: >90 mL/min (ref >90)
GFR calc non Af Amer: 81 mL/min — ABNORMAL LOW (ref >90)
Glucose, Bld: 98 mg/dL (ref 70–99)
POTASSIUM: 4.6 meq/L (ref 3.7–5.3)
SODIUM: 141 meq/L (ref 137–147)
TOTAL PROTEIN: 7 g/dL (ref 6.0–8.3)

## 2014-03-23 LAB — HEPATIC FUNCTION PANEL
ALBUMIN: 4.1 g/dL (ref 3.5–5.2)
ALT: 18 U/L (ref 0–53)
AST: 23 U/L (ref 0–37)
Alkaline Phosphatase: 67 U/L (ref 39–117)
BILIRUBIN DIRECT: 0.1 mg/dL (ref 0.0–0.3)
Indirect Bilirubin: 0.5 mg/dL (ref 0.2–1.2)
Total Bilirubin: 0.6 mg/dL (ref 0.2–1.2)
Total Protein: 6.7 g/dL (ref 6.0–8.3)

## 2014-03-23 LAB — CBC WITH DIFFERENTIAL/PLATELET
Basophils Absolute: 0 10*3/uL (ref 0.0–0.1)
Basophils Relative: 1 % (ref 0–1)
EOS ABS: 0.1 10*3/uL (ref 0.0–0.7)
Eosinophils Relative: 2 % (ref 0–5)
HCT: 37.8 % — ABNORMAL LOW (ref 39.0–52.0)
HEMOGLOBIN: 12.5 g/dL — AB (ref 13.0–17.0)
Lymphocytes Relative: 26 % (ref 12–46)
Lymphs Abs: 1.1 10*3/uL (ref 0.7–4.0)
MCH: 29.1 pg (ref 26.0–34.0)
MCHC: 33.1 g/dL (ref 30.0–36.0)
MCV: 87.9 fL (ref 78.0–100.0)
MONOS PCT: 16 % — AB (ref 3–12)
Monocytes Absolute: 0.7 10*3/uL (ref 0.1–1.0)
NEUTROS ABS: 2.3 10*3/uL (ref 1.7–7.7)
NEUTROS PCT: 55 % (ref 43–77)
PLATELETS: 136 10*3/uL — AB (ref 150–400)
RBC: 4.3 MIL/uL (ref 4.22–5.81)
RDW: 16 % — ABNORMAL HIGH (ref 11.5–15.5)
WBC: 4.1 10*3/uL (ref 4.0–10.5)

## 2014-03-23 LAB — LIPID PANEL
CHOL/HDL RATIO: 3.4 ratio
CHOLESTEROL: 113 mg/dL (ref 0–200)
HDL: 33 mg/dL — AB (ref >39)
LDL Cholesterol: 54 mg/dL (ref 0–99)
Triglycerides: 132 mg/dL (ref <150)
VLDL: 26 mg/dL (ref 0–40)

## 2014-03-23 LAB — C-REACTIVE PROTEIN: CRP: <0.5 mg/dL — ABNORMAL LOW (ref <0.60)

## 2014-03-23 LAB — FERRITIN: FERRITIN: 44 ng/mL (ref 22–322)

## 2014-03-23 MED ORDER — SODIUM CHLORIDE 0.9 % IV SOLN
Freq: Once | INTRAVENOUS | Status: AC
  Administered 2014-03-23: 13:00:00 via INTRAVENOUS

## 2014-03-23 MED ORDER — IMMUNE GLOBULIN (HUMAN) 20 GM/200ML IV SOLN
35.0000 g | Freq: Once | INTRAVENOUS | Status: AC
  Administered 2014-03-23: 35 g via INTRAVENOUS
  Filled 2014-03-23: qty 200

## 2014-03-23 MED ORDER — SODIUM CHLORIDE 0.9 % IJ SOLN
10.0000 mL | INTRAMUSCULAR | Status: DC | PRN
  Administered 2014-03-23: 10 mL

## 2014-03-23 MED ORDER — ACETAMINOPHEN 325 MG PO TABS: 650.0000 mg | ORAL_TABLET | Freq: Four times a day (QID) | ORAL | Status: DC | PRN

## 2014-03-23 MED ORDER — HEPARIN SOD (PORK) LOCK FLUSH 100 UNIT/ML IV SOLN
500.0000 [IU] | Freq: Once | INTRAVENOUS | Status: AC | PRN
  Administered 2014-03-23: 500 [IU]
  Filled 2014-03-23: qty 5

## 2014-03-23 MED ORDER — ZOLEDRONIC ACID 4 MG/5ML IV CONC
4.0000 mg | Freq: Once | INTRAVENOUS | Status: AC
  Administered 2014-03-23: 4 mg via INTRAVENOUS
  Filled 2014-03-23: qty 5

## 2014-03-23 NOTE — Progress Notes (Signed)
Tolerated well

## 2014-03-24 LAB — KAPPA/LAMBDA LIGHT CHAINS
KAPPA, LAMDA LIGHT CHAIN RATIO: 0.91 (ref 0.26–1.65)
Kappa free light chain: 1.67 mg/dL (ref 0.33–1.94)
Lambda free light chains: 1.84 mg/dL (ref 0.57–2.63)

## 2014-03-25 ENCOUNTER — Other Ambulatory Visit: Payer: Self-pay | Admitting: Family Medicine

## 2014-03-25 LAB — MULTIPLE MYELOMA PANEL, SERUM
ALBUMIN ELP: 59.2 % (ref 55.8–66.1)
ALPHA-2-GLOBULIN: 10.9 % (ref 7.1–11.8)
Alpha-1-Globulin: 5.2 % — ABNORMAL HIGH (ref 2.9–4.9)
BETA 2: 4 % (ref 3.2–6.5)
BETA GLOBULIN: 7.7 % — AB (ref 4.7–7.2)
Gamma Globulin: 13 % (ref 11.1–18.8)
IGA: 210 mg/dL (ref 68–379)
IGM, SERUM: 24 mg/dL — AB (ref 41–251)
IgG (Immunoglobin G), Serum: 867 mg/dL (ref 650–1600)
M-Spike, %: NOT DETECTED g/dL
TOTAL PROTEIN: 6.6 g/dL (ref 6.0–8.3)

## 2014-03-25 LAB — BETA 2 MICROGLOBULIN, SERUM: Beta-2 Microglobulin: 2.54 mg/L — ABNORMAL HIGH (ref <=2.51)

## 2014-03-25 NOTE — Sleep Study (Signed)
[place] A. Merlene Laughter, MD     [email or web address]        NOCTURNAL POLYSOMNOGRAM    LOCATION: SLEEP LAB FACILITY: [facility]   PHYSICIAN: Laresa Oshiro A. Merlene Laughter, M.D.   DATE OF STUDY: 03/15/2014.   REFERRING PHYSICIAN: Quay Burow.  INDICATIONS: This is a 73 year old man who presents with hypersomnia, snoring and a complaint of arrhythmia.  MEDICATIONS:  Prior to Admission medications   Medication Sig Start Date End Date Taking? Authorizing Provider  ALPRAZolam Duanne Moron) 0.5 MG tablet Take 1 tablet (0.5 mg total) by mouth at bedtime as needed for anxiety. 02/23/14   Farrel Gobble, MD  amLODipine (NORVASC) 10 MG tablet Take 10 mg by mouth daily.      Historical Provider, MD  aspirin 81 MG tablet Take 81 mg by mouth daily.    Historical Provider, MD  azithromycin (ZITHROMAX) 250 MG tablet As directed 03/01/14   Manon Hilding Kefalas, PA-C  citalopram (CELEXA) 40 MG tablet TAKE 1 TABLET BY MOUTH EVERY DAY    Mikey Kirschner, MD  clopidogrel (PLAVIX) 75 MG tablet TAKE 1 TABLET BY MOUTH EVERY DAY 10/15/13   Mihai Croitoru, MD  gabapentin (NEURONTIN) 600 MG tablet Take 1 tablet (600 mg total) by mouth 3 (three) times daily. 02/08/14   Baird Cancer, PA-C  ibuprofen (ADVIL,MOTRIN) 200 MG tablet Take 200 mg by mouth every 6 (six) hours as needed.    Historical Provider, MD  isosorbide mononitrate (IMDUR) 30 MG 24 hr tablet Take 1 tablet (30 mg total) by mouth daily. 11/25/13   Erlene Quan, PA-C  lenalidomide (REVLIMID) 10 MG capsule Take 1 capsule (10 mg total) by mouth daily. Take 1 capsule by mouth daily for 7 days on followed by 7 days off 03/16/14   Baird Cancer, PA-C  nitroGLYCERIN (NITROSTAT) 0.4 MG SL tablet Place 1 tablet (0.4 mg total) under the tongue every 5 (five) minutes as needed for chest pain. 04/02/13   Kathyrn Drown, MD  omeprazole (PRILOSEC) 20 MG capsule Take 1 capsule (20 mg total) by mouth daily. 03/22/13   Mikey Kirschner, MD  oxyCODONE-acetaminophen  (PERCOCET/ROXICET) 5-325 MG per tablet Take 1 tablet by mouth every 6 (six) hours as needed. 03/14/14   Baird Cancer, PA-C  potassium chloride SA (KLOR-CON M20) 20 MEQ tablet Take 1 tablet (20 mEq total) by mouth 3 (three) times daily. 01/28/14   Baird Cancer, PA-C  simvastatin (ZOCOR) 20 MG tablet Take 20 mg by mouth at bedtime.      Historical Provider, MD  temazepam (RESTORIL) 15 MG capsule Take 1-2 capsules (15-30 mg total) by mouth at bedtime as needed for sleep. 11/19/13   Baird Cancer, PA-C  traMADol (ULTRAM) 50 MG tablet Take 50 mg by mouth every 6 (six) hours as needed. 09/07/13   Historical Provider, MD  Zoledronic Acid (ZOMETA) 4 MG/100ML IVPB Inject 4 mg into the vein every 30 (thirty) days.    Historical Provider, MD      EPWORTH SLEEPINESS SCALE: 15.   BMI: 29.   ARCHITECTURAL SUMMARY: This is a split-night recording with initial portion been a diagnostic and the second portion saturation recording. Total recording time was 429 minutes. Sleep efficiency 72 %. Sleep latency 12 minutes. REM latency 188 minutes. Stage NI 16 %, N2 62 % and N3 3 % and REM sleep 18 %.    RESPIRATORY DATA:  Baseline oxygen saturation is 93 %. The lowest saturation is 88 %. The diagnostic  AHI is 16. The patient was placed on positive pressure is starting at 4 and increase to 7. He also was tried on bilevel pressure of 8/4. The optimal pressure is 6 has high pressures resulted in central apneas.  LIMB MOVEMENT SUMMARY: PLM index 2.   ELECTROCARDIOGRAM SUMMARY: Average heart rate is 62 with no significant dysrhythmias observed.   IMPRESSION:  1. Moderate obstructive sleep apnea syndrome which responds well to a CPAP of 6.  Thanks for this referral.  Casandra Dallaire A. Merlene Laughter, M.D. Diplomat, Tax adviser of Sleep Medicine.

## 2014-03-28 ENCOUNTER — Other Ambulatory Visit (HOSPITAL_COMMUNITY): Payer: Self-pay | Admitting: Oncology

## 2014-03-28 DIAGNOSIS — E876 Hypokalemia: Secondary | ICD-10-CM

## 2014-03-28 MED ORDER — POTASSIUM CHLORIDE CRYS ER 20 MEQ PO TBCR: 20.0000 meq | EXTENDED_RELEASE_TABLET | Freq: Three times a day (TID) | ORAL | Status: DC

## 2014-03-29 ENCOUNTER — Encounter: Payer: Self-pay | Admitting: *Deleted

## 2014-04-05 DIAGNOSIS — M19019 Primary osteoarthritis, unspecified shoulder: Secondary | ICD-10-CM | POA: Diagnosis not present

## 2014-04-07 ENCOUNTER — Encounter (INDEPENDENT_AMBULATORY_CARE_PROVIDER_SITE_OTHER): Payer: Self-pay

## 2014-04-07 ENCOUNTER — Ambulatory Visit (INDEPENDENT_AMBULATORY_CARE_PROVIDER_SITE_OTHER): Payer: Medicare Other | Admitting: General Surgery

## 2014-04-07 ENCOUNTER — Encounter (INDEPENDENT_AMBULATORY_CARE_PROVIDER_SITE_OTHER): Payer: Self-pay | Admitting: General Surgery

## 2014-04-07 VITALS — BP 132/80 | HR 90 | Temp 97.5°F | Ht 68.0 in | Wt 192.0 lb

## 2014-04-07 DIAGNOSIS — K4090 Unilateral inguinal hernia, without obstruction or gangrene, not specified as recurrent: Secondary | ICD-10-CM | POA: Diagnosis not present

## 2014-04-07 NOTE — Progress Notes (Signed)
Subjective:   worsening right groin pain  Patient ID: Jeremy Parrish, male   DOB: July 18, 1941, 73 y.o.   MRN: [id number]  HPI Patient returns to the office. I had seen him approximately one year ago some intermittent right groin pain. At that time his physical exam was not really remarkable in terms of an inguinal hernia but we did get a CT scan showing a small fat-containing right inguinal hernia. We felt that likely this was the source of his pain but at that point it was not bad enough that he wanted to go ahead with surgery. However over recent months he has been having increasing pain in his right groin with routine physical activity coughing etc. No GI symptoms. He's not noted any large bulge.  Past Medical History  Diagnosis Date  . Hypertension   . Heart disease   . Kidney stones     history  . Lung mass   . Heart murmur   . 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 Coronary artery disease   . Depression   . Shortness of breath   . Peripheral arterial disease   . Bladder neck contracture   . Personal history of other diseases of circulatory system   . Aortic aneurysm of unspecified site without mention of rupture   . Arthritis   . Intestinovesical fistula   . Esophageal reflux   . Hyperlipidemia   . Anemia   . CHF (congestive heart failure)   . COPD (chronic obstructive pulmonary disease)   . Myocardial infarction   . Cerebral atherosclerosis     Carotid Doppler, 02/16/2013 - Bilateral Proximal ICAs,demonstrate mild plaque w/o evidence of significant diameter reduction, dissection, or any other vascular abnormality  . Complication of anesthesia   . PONV (postoperative nausea and vomiting)   . Stroke 2013    Speech.  . Cancer   . Prostate cancer 2000  . Multiple myeloma   . Hx of bladder cancer 10/07/2012  . Non Hodgkin's lymphoma    Past Surgical History  Procedure Laterality Date  . Prostate surgery    .  Heart stents x 5  1999  . Portacath placement  07/26/2009  . Wrist surgery      right  . Left ear skin cancer removed    . Bone marrow transplant  2011  . Pacemaker insertion  07/22/2011    Medtronic  . Coronary angioplasty  06/24/2000    PCI and stenting in mid & proximal RCA  . Insert / replace / remove pacemaker    . Tee without cardioversion  10/13/2012    Procedure: TRANSESOPHAGEAL ECHOCARDIOGRAM (TEE);  Surgeon: Sanda Klein, MD;  Location: [facility] ENDOSCOPY;  Service: Cardiovascular;  Laterality: N/A;  pat/kay/echo notified  . Colonoscopy N/A 01/01/2013    Procedure: COLONOSCOPY;  Surgeon: Rogene Houston, MD;  Location: [facility];  Service: Endoscopy;  Laterality: N/A;  825-moved to [place] notified pt  . Rotator    . Rotator cuff repair Right   . Colon surgery      colon resection  . Bladder surgery    . Shoulder arthroscopy with subacromial decompression Right 07/21/2013    Procedure: RIGHT SHOULDER ARTHROSCOPY WITH SUBACROMIAL DECOMPRESSION AND DEBRIDEMENT & Injection of Left Shoulder;  Surgeon: Alta Corning, MD;  Location: [facility];  Service: Orthopedics;  Laterality: Right;  . US echocardiography  06/19/2011    RV mildly dilated,mild to mod. MR,mild AI,mild PI  .  Nm myocar perf wall motion  11/27/2007    inferior scar   Current Outpatient Prescriptions  Medication Sig Dispense Refill  . ALPRAZolam (XANAX) 0.5 MG tablet Take 1 tablet (0.5 mg total) by mouth at bedtime as needed for anxiety.  30 tablet  5  . amLODipine (NORVASC) 10 MG tablet Take 10 mg by mouth daily.        Marland Kitchen aspirin 81 MG tablet Take 81 mg by mouth daily.      Marland Kitchen azithromycin (ZITHROMAX) 250 MG tablet As directed  6 each  0  . citalopram (CELEXA) 40 MG tablet TAKE 1 TABLET BY MOUTH EVERY DAY  90 tablet  0  . clopidogrel (PLAVIX) 75 MG tablet TAKE 1 TABLET BY MOUTH EVERY DAY  90 tablet  3  . gabapentin (NEURONTIN) 600 MG tablet Take 1 tablet (600 mg total) by mouth 3 (three) times daily.  90 tablet  2  .  ibuprofen (ADVIL,MOTRIN) 200 MG tablet Take 200 mg by mouth every 6 (six) hours as needed.      . isosorbide mononitrate (IMDUR) 30 MG 24 hr tablet Take 1 tablet (30 mg total) by mouth daily.  30 tablet  6  . lenalidomide (REVLIMID) 10 MG capsule Take 1 capsule (10 mg total) by mouth daily. Take 1 capsule by mouth daily for 7 days on followed by 7 days off  14 capsule  0  . nitroGLYCERIN (NITROSTAT) 0.4 MG SL tablet Place 1 tablet (0.4 mg total) under the tongue every 5 (five) minutes as needed for chest pain.  25 tablet  12  . omeprazole (PRILOSEC) 20 MG capsule TAKE 1 CAPSULE EVERY DAY  90 capsule  0  . oxyCODONE-acetaminophen (PERCOCET/ROXICET) 5-325 MG per tablet Take 1 tablet by mouth every 6 (six) hours as needed.  60 tablet  0  . potassium chloride SA (KLOR-CON M20) 20 MEQ tablet Take 1 tablet (20 mEq total) by mouth 3 (three) times daily.  90 tablet  1  . simvastatin (ZOCOR) 20 MG tablet Take 20 mg by mouth at bedtime.        . temazepam (RESTORIL) 15 MG capsule Take 1-2 capsules (15-30 mg total) by mouth at bedtime as needed for sleep.  60 capsule  0  . traMADol (ULTRAM) 50 MG tablet Take 50 mg by mouth every 6 (six) hours as needed.      . Zoledronic Acid (ZOMETA) 4 MG/100ML IVPB Inject 4 mg into the vein every 30 (thirty) days.       No current facility-administered medications for this visit.   Allergies  Allergen Reactions  . Diphenhydramine Hcl     REACTION: "hyper"  . Morphine And Related Other (See Comments)    hallucinations  . Tape Rash    Paper tape is ok   History  Substance Use Topics  . Smoking status: Former Smoker -- 1.00 packs/day for 20 years    Types: Cigarettes    Quit date: 11/14/1994  . Smokeless tobacco: Never Used  . Alcohol Use: No     Comment: previously drank but none for at least 15 years.     Review of Systems  Constitutional: Negative for fever, chills and activity change.  Respiratory: Positive for shortness of breath. Negative for chest  tightness and wheezing.   Cardiovascular: Negative.   Gastrointestinal: Negative.   Neurological: Positive for speech difficulty.       Objective:   Physical Exam BP 132/80  Pulse 90  Temp(Src) 97.5 F (36.4  C)  Ht _0  (1.727 m)  Wt 192 lb (87.091 kg)  BMI 29.20 kg/m2 General: Alert, well-developed Caucasian male, dysarthric speech, in no distress Skin: Warm and dry without rash or infection. Solar damage on extremities and face HEENT: No palpable masses or thyromegaly. Sclera nonicteric. Pupils equal round and reactive. Oropharynx clear. Lymph nodes: there is moderate bilateral palpable axillary adenopathy. No cervical or inguinal adenopathy noted.. Lungs: Breath sounds clear and equal without increased work of breathing Cardiovascular: Regular rate and rhythm without murmur. No JVD or edema. Peripheral pulses intact. Abdomen: Nondistended. Soft and nontender. No masses palpable. No organomegaly .there is a small but definite and tender right inguinal hernia with the patient coughing or straining Extremities: No edema or joint swelling or deformity. No chronic venous stasis changes. Neurologic: Alert and fully oriented. Dysarthric speech. Gait normal. No focal deficits.     Assessment:     Worsening symptomatic right inguinal hernia. The patient has multiple medical problems but these appear to be relatively stable and he is quite active. The pain is a daily problem for him and interfering with his activity. A long discussion with the patient and his wife regarding pros and cons of repair. He was certainly be at some increased risk due to his multiple medical problems. We would need to obtain cardiac clearance. However he feels that his pain is interfering with his activity and a daily problem for him and therefore think it would be reasonable to proceed with repair of his right inguinal hernia. We discussed the nature of the surgery and expected recovery as well as complications and  anesthetic complications, cardiorespiratory complications, bleeding, infection, recurrence and chronic pain.     Plan:     Scheduled for open repair of right inguinal hernia with overnight hospitalization under general anesthesia after cardiac clearance.

## 2014-04-07 NOTE — Patient Instructions (Signed)
We will call to schedule your surgery after we get cardiac clearance

## 2014-04-08 ENCOUNTER — Other Ambulatory Visit (HOSPITAL_COMMUNITY): Payer: Self-pay | Admitting: Oncology

## 2014-04-08 DIAGNOSIS — C9000 Multiple myeloma not having achieved remission: Secondary | ICD-10-CM

## 2014-04-08 MED ORDER — LENALIDOMIDE 10 MG PO CAPS: 10.0000 mg | ORAL_CAPSULE | Freq: Every day | ORAL | Status: DC

## 2014-04-08 NOTE — Progress Notes (Signed)
Labs drawn

## 2014-04-14 DIAGNOSIS — L570 Actinic keratosis: Secondary | ICD-10-CM | POA: Diagnosis not present

## 2014-04-14 DIAGNOSIS — L819 Disorder of pigmentation, unspecified: Secondary | ICD-10-CM | POA: Diagnosis not present

## 2014-04-14 DIAGNOSIS — K031 Abrasion of teeth: Secondary | ICD-10-CM | POA: Diagnosis not present

## 2014-04-14 DIAGNOSIS — C44621 Squamous cell carcinoma of skin of unspecified upper limb, including shoulder: Secondary | ICD-10-CM | POA: Diagnosis not present

## 2014-04-14 DIAGNOSIS — Z85828 Personal history of other malignant neoplasm of skin: Secondary | ICD-10-CM | POA: Diagnosis not present

## 2014-04-14 DIAGNOSIS — D485 Neoplasm of uncertain behavior of skin: Secondary | ICD-10-CM | POA: Diagnosis not present

## 2014-04-20 ENCOUNTER — Encounter (HOSPITAL_COMMUNITY): Payer: Medicare Other | Admitting: Oncology

## 2014-04-20 ENCOUNTER — Telehealth: Payer: Self-pay | Admitting: Cardiovascular Disease

## 2014-04-20 ENCOUNTER — Encounter (HOSPITAL_COMMUNITY): Payer: Medicare Other | Attending: Oncology

## 2014-04-20 ENCOUNTER — Telehealth: Payer: Self-pay | Admitting: *Deleted

## 2014-04-20 ENCOUNTER — Encounter (HOSPITAL_BASED_OUTPATIENT_CLINIC_OR_DEPARTMENT_OTHER): Payer: Medicare Other

## 2014-04-20 VITALS — BP 135/53 | HR 96 | Temp 98.3°F | Resp 18 | Wt 191.8 lb

## 2014-04-20 DIAGNOSIS — F411 Generalized anxiety disorder: Secondary | ICD-10-CM | POA: Diagnosis not present

## 2014-04-20 DIAGNOSIS — C9000 Multiple myeloma not having achieved remission: Secondary | ICD-10-CM

## 2014-04-20 DIAGNOSIS — C9001 Multiple myeloma in remission: Secondary | ICD-10-CM

## 2014-04-20 DIAGNOSIS — D801 Nonfamilial hypogammaglobulinemia: Secondary | ICD-10-CM

## 2014-04-20 DIAGNOSIS — C8589 Other specified types of non-Hodgkin lymphoma, extranodal and solid organ sites: Secondary | ICD-10-CM

## 2014-04-20 DIAGNOSIS — F419 Anxiety disorder, unspecified: Secondary | ICD-10-CM

## 2014-04-20 DIAGNOSIS — Z8551 Personal history of malignant neoplasm of bladder: Secondary | ICD-10-CM

## 2014-04-20 DIAGNOSIS — G8191 Hemiplegia, unspecified affecting right dominant side: Secondary | ICD-10-CM

## 2014-04-20 LAB — CBC WITH DIFFERENTIAL/PLATELET
Basophils Absolute: 0.1 10*3/uL (ref 0.0–0.1)
Basophils Relative: 1 % (ref 0–1)
Eosinophils Absolute: 0.2 10*3/uL (ref 0.0–0.7)
Eosinophils Relative: 3 % (ref 0–5)
HEMATOCRIT: 39.2 % (ref 39.0–52.0)
HEMOGLOBIN: 13 g/dL (ref 13.0–17.0)
LYMPHS ABS: 1.4 10*3/uL (ref 0.7–4.0)
LYMPHS PCT: 26 % (ref 12–46)
MCH: 29.3 pg (ref 26.0–34.0)
MCHC: 33.2 g/dL (ref 30.0–36.0)
MCV: 88.5 fL (ref 78.0–100.0)
MONOS PCT: 12 % (ref 3–12)
Monocytes Absolute: 0.6 10*3/uL (ref 0.1–1.0)
Neutro Abs: 3.1 10*3/uL (ref 1.7–7.7)
Neutrophils Relative %: 58 % (ref 43–77)
Platelets: 138 10*3/uL — ABNORMAL LOW (ref 150–400)
RBC: 4.43 MIL/uL (ref 4.22–5.81)
RDW: 16.2 % — ABNORMAL HIGH (ref 11.5–15.5)
WBC: 5.3 10*3/uL (ref 4.0–10.5)

## 2014-04-20 LAB — COMPREHENSIVE METABOLIC PANEL
ALT: 17 U/L (ref 0–53)
AST: 21 U/L (ref 0–37)
Albumin: 3.7 g/dL (ref 3.5–5.2)
Alkaline Phosphatase: 77 U/L (ref 39–117)
BUN: 13 mg/dL (ref 6–23)
CALCIUM: 9.4 mg/dL (ref 8.4–10.5)
CO2: 25 meq/L (ref 19–32)
Chloride: 102 mEq/L (ref 96–112)
Creatinine, Ser: 0.96 mg/dL (ref 0.50–1.35)
GFR, EST NON AFRICAN AMERICAN: 81 mL/min — AB (ref >90)
GLUCOSE: 138 mg/dL — AB (ref 70–99)
Potassium: 4 mEq/L (ref 3.7–5.3)
SODIUM: 141 meq/L (ref 137–147)
Total Bilirubin: 0.4 mg/dL (ref 0.3–1.2)
Total Protein: 6.9 g/dL (ref 6.0–8.3)

## 2014-04-20 LAB — IRON AND TIBC
Iron: 99 ug/dL (ref 42–135)
SATURATION RATIOS: 27 % (ref 20–55)
TIBC: 365 ug/dL (ref 215–435)
UIBC: 266 ug/dL (ref 125–400)

## 2014-04-20 LAB — LACTATE DEHYDROGENASE: LDH: 170 U/L (ref 94–250)

## 2014-04-20 LAB — C-REACTIVE PROTEIN: CRP: <0.5 mg/dL — ABNORMAL LOW (ref <0.60)

## 2014-04-20 LAB — FERRITIN: Ferritin: 54 ng/mL (ref 22–322)

## 2014-04-20 MED ORDER — SODIUM CHLORIDE 0.9 % IJ SOLN
10.0000 mL | INTRAMUSCULAR | Status: DC | PRN
  Administered 2014-04-20: 10 mL

## 2014-04-20 MED ORDER — ACETAMINOPHEN 325 MG PO TABS: 650.0000 mg | ORAL_TABLET | Freq: Four times a day (QID) | ORAL | Status: DC | PRN

## 2014-04-20 MED ORDER — HEPARIN SOD (PORK) LOCK FLUSH 100 UNIT/ML IV SOLN: 500.0000 [IU] | Freq: Once | INTRAVENOUS | Status: AC | PRN

## 2014-04-20 MED ORDER — HEPARIN SOD (PORK) LOCK FLUSH 100 UNIT/ML IV SOLN
INTRAVENOUS | Status: AC
  Filled 2014-04-20: qty 5

## 2014-04-20 MED ORDER — SODIUM CHLORIDE 0.9 % IV SOLN
Freq: Once | INTRAVENOUS | Status: AC
  Administered 2014-04-20: 10:00:00 via INTRAVENOUS

## 2014-04-20 MED ORDER — SODIUM CHLORIDE 0.9 % IV SOLN: Freq: Once | INTRAVENOUS | Status: DC

## 2014-04-20 MED ORDER — ZOLEDRONIC ACID 4 MG/5ML IV CONC
4.0000 mg | Freq: Once | INTRAVENOUS | Status: AC
  Administered 2014-04-20: 4 mg via INTRAVENOUS
  Filled 2014-04-20: qty 5

## 2014-04-20 MED ORDER — ALPRAZOLAM 0.5 MG PO TABS: 0.5000 mg | ORAL_TABLET | Freq: Every evening | ORAL | Status: DC | PRN

## 2014-04-20 MED ORDER — IMMUNE GLOBULIN (HUMAN) 10 GM/100ML IV SOLN
0.4000 g/kg | Freq: Once | INTRAVENOUS | Status: AC
  Administered 2014-04-20: 35 g via INTRAVENOUS
  Filled 2014-04-20: qty 300

## 2014-04-20 NOTE — Telephone Encounter (Signed)
Dr Gwenlyn Found reviewed the chart and gave cardiac clearance for right inguinal hernia repair.  Cleared at moderate risk due to LV dysfunction.  Okay to hold plavix prior to procedure.  Letter drafted and faxed to [facility]

## 2014-04-20 NOTE — Telephone Encounter (Signed)
Patient calling to see if he needed to get CPAP supplies as he met criteria for sleep apnea. RN advised him there is a correlation between heart disease/heart problems and sleep apnea so her should get the supplies.   Patient also requested cardiac clearance be sent to Dr. Lear Ng office.   Jeremy Bears, RN aware of clearance.

## 2014-04-20 NOTE — Progress Notes (Signed)
Jeremy Parrish is here for IVIG infusion.  He requested an appointment to see me.  I saw him between clinic patients.  He requested a refill on his "nerve pill."  I reviewed his medication list.  He has Xanax on his med list.  It was filled by Dr. Barnet Glasgow on 4/15 with 5 refills.  I called [facility] and they report that they never received the prescription.  Jeremy Parrish is a very reliable patient and therefore, I suspect he never dropped thee RX off or lost the Rx.  I will call him in a new prescription.  Jeremy Parrish 04/20/2014

## 2014-04-20 NOTE — Progress Notes (Unsigned)
Jeremy Parrish presented for Portacath access and flush.  Proper placement of portacath confirmed by CXR.  Portacath located right chest wall accessed with  H 20 needle.  Good blood return present. Portacath flushed with 53ml NS and 500U/86ml Heparin and needle removed intact.  Procedure tolerated well and without incident.   Pt tolerated treatment well.

## 2014-04-20 NOTE — Telephone Encounter (Signed)
Wanting to know the results of the sleep study and also has some other questions .Jeremy Parrish Please call at either his home or cell number .Jeremy Parrish  Thanks

## 2014-04-20 NOTE — Progress Notes (Signed)
Labs drawn for cbc/diff, cmp, ldh, beta 2 microglobulin, c-reactive protein, multiple myeloma panel, kappa/lambda light chains, iron and tibc, ferr.

## 2014-04-21 ENCOUNTER — Telehealth (INDEPENDENT_AMBULATORY_CARE_PROVIDER_SITE_OTHER): Payer: Self-pay

## 2014-04-21 ENCOUNTER — Encounter (INDEPENDENT_AMBULATORY_CARE_PROVIDER_SITE_OTHER): Payer: Self-pay

## 2014-04-21 LAB — KAPPA/LAMBDA LIGHT CHAINS
Kappa free light chain: 1.3 mg/dL (ref 0.33–1.94)
Kappa, lambda light chain ratio: 0.83 (ref 0.26–1.65)
LAMDA FREE LIGHT CHAINS: 1.57 mg/dL (ref 0.57–2.63)

## 2014-04-21 NOTE — Telephone Encounter (Signed)
Called patient to make aware that we have received from Dr. Kennon Holter office.  Patient made aware that per Dr. Gwenlyn Found he needs to hold his Plavix 5-7 days prior to surgery.  Patient advised our surgery schedulers should contact him next week to schedule his Hernia Repair.  Written orders placed in INBOX for OR scheduling.

## 2014-04-22 LAB — MULTIPLE MYELOMA PANEL, SERUM
Albumin ELP: 58 % (ref 55.8–66.1)
Alpha-1-Globulin: 5.2 % — ABNORMAL HIGH (ref 2.9–4.9)
Alpha-2-Globulin: 11.6 % (ref 7.1–11.8)
BETA GLOBULIN: 7.3 % — AB (ref 4.7–7.2)
Beta 2: 5.1 % (ref 3.2–6.5)
GAMMA GLOBULIN: 12.8 % (ref 11.1–18.8)
IGA: 188 mg/dL (ref 68–379)
IgG (Immunoglobin G), Serum: 864 mg/dL (ref 650–1600)
IgM, Serum: 20 mg/dL — ABNORMAL LOW (ref 41–251)
M-SPIKE, %: NOT DETECTED g/dL
TOTAL PROTEIN: 6.6 g/dL (ref 6.0–8.3)

## 2014-04-22 LAB — BETA 2 MICROGLOBULIN, SERUM: Beta-2 Microglobulin: 3.37 mg/L — ABNORMAL HIGH (ref <=2.51)

## 2014-05-02 ENCOUNTER — Encounter (HOSPITAL_COMMUNITY): Payer: Self-pay | Admitting: Pharmacy Technician

## 2014-05-02 ENCOUNTER — Encounter (INDEPENDENT_AMBULATORY_CARE_PROVIDER_SITE_OTHER): Payer: Self-pay

## 2014-05-02 ENCOUNTER — Encounter (HOSPITAL_COMMUNITY)
Admission: RE | Admit: 2014-05-02 | Discharge: 2014-05-02 | Disposition: A | Discharge status: Home / Self Care | Payer: Medicare Other | Source: Ambulatory Visit | Attending: General Surgery | Admitting: General Surgery

## 2014-05-02 ENCOUNTER — Encounter (HOSPITAL_COMMUNITY): Payer: Self-pay

## 2014-05-02 DIAGNOSIS — Z87891 Personal history of nicotine dependence: Secondary | ICD-10-CM | POA: Diagnosis not present

## 2014-05-02 DIAGNOSIS — I10 Essential (primary) hypertension: Secondary | ICD-10-CM | POA: Diagnosis not present

## 2014-05-02 DIAGNOSIS — I739 Peripheral vascular disease, unspecified: Secondary | ICD-10-CM | POA: Diagnosis not present

## 2014-05-02 DIAGNOSIS — I2510 Atherosclerotic heart disease of native coronary artery without angina pectoris: Secondary | ICD-10-CM | POA: Diagnosis not present

## 2014-05-02 DIAGNOSIS — K4090 Unilateral inguinal hernia, without obstruction or gangrene, not specified as recurrent: Secondary | ICD-10-CM | POA: Diagnosis not present

## 2014-05-02 DIAGNOSIS — D839 Common variable immunodeficiency, unspecified: Secondary | ICD-10-CM | POA: Diagnosis not present

## 2014-05-02 DIAGNOSIS — I252 Old myocardial infarction: Secondary | ICD-10-CM | POA: Diagnosis not present

## 2014-05-02 DIAGNOSIS — E785 Hyperlipidemia, unspecified: Secondary | ICD-10-CM | POA: Diagnosis not present

## 2014-05-02 DIAGNOSIS — Z87898 Personal history of other specified conditions: Secondary | ICD-10-CM | POA: Diagnosis not present

## 2014-05-02 DIAGNOSIS — Z8546 Personal history of malignant neoplasm of prostate: Secondary | ICD-10-CM | POA: Diagnosis not present

## 2014-05-02 DIAGNOSIS — Z9481 Bone marrow transplant status: Secondary | ICD-10-CM | POA: Diagnosis not present

## 2014-05-02 DIAGNOSIS — I079 Rheumatic tricuspid valve disease, unspecified: Secondary | ICD-10-CM | POA: Diagnosis not present

## 2014-05-02 DIAGNOSIS — D176 Benign lipomatous neoplasm of spermatic cord: Secondary | ICD-10-CM | POA: Diagnosis not present

## 2014-05-02 DIAGNOSIS — I379 Nonrheumatic pulmonary valve disorder, unspecified: Secondary | ICD-10-CM | POA: Diagnosis not present

## 2014-05-02 DIAGNOSIS — I69928 Other speech and language deficits following unspecified cerebrovascular disease: Secondary | ICD-10-CM | POA: Diagnosis not present

## 2014-05-02 DIAGNOSIS — Z7902 Long term (current) use of antithrombotics/antiplatelets: Secondary | ICD-10-CM | POA: Diagnosis not present

## 2014-05-02 DIAGNOSIS — Z01812 Encounter for preprocedural laboratory examination: Secondary | ICD-10-CM | POA: Diagnosis not present

## 2014-05-02 DIAGNOSIS — J449 Chronic obstructive pulmonary disease, unspecified: Secondary | ICD-10-CM | POA: Diagnosis not present

## 2014-05-02 DIAGNOSIS — Z79899 Other long term (current) drug therapy: Secondary | ICD-10-CM | POA: Diagnosis not present

## 2014-05-02 DIAGNOSIS — Z950 Presence of cardiac pacemaker: Secondary | ICD-10-CM | POA: Diagnosis not present

## 2014-05-02 DIAGNOSIS — Z7982 Long term (current) use of aspirin: Secondary | ICD-10-CM | POA: Diagnosis not present

## 2014-05-02 HISTORY — DX: Sleep apnea, unspecified: G47.30

## 2014-05-02 NOTE — Patient Instructions (Addendum)
20 Jeremy Parrish  05/02/2014   Your procedure is scheduled on:  6-24 -2015  Enter through [facility] Entrance and follow signs to [facility]. Arrive at     0900   AM.  Call this number if you have problems the morning of surgery: [phone]  Or Presurgical Testing [phone]([place]) For Living Will and/or Health Care Power Attorney Forms: please provide copy for your medical record,may bring AM of surgery(Forms should be already notarized -we do not provide this service).05-02-14 (Yes, but prefers not to provide at this time.)  For Cpap use: Bring mask and tubing only.(if machine received by surgery day )   Do not eat food:After Midnight.    Take these medicines the morning of surgery with A SIP OF WATER: Amlodipine. Gabapentin. Isosorbide. Omeprazole. Use Plavix, Aspirin as per MD instructions.   Do not wear jewelry, make-up or nail polish.  Do not wear lotions, powders, or perfumes. You may wear deodorant.  Do not shave 48 hours(2 days) prior to first CHG shower(legs and under arms).(Shaving face and neck okay.)  Do not bring valuables to the hospital.(Hospital is not responsible for lost valuables).  Contacts, dentures or removable bridgework, body piercing, hair pins may not be worn into surgery.  Leave suitcase in the car. After surgery it may be brought to your room.  For patients admitted to the hospital, checkout time is 11:00 AM the day of discharge.(Restricted visitors-Any Persons displaying flu-like symptoms or illness).    Patients discharged the day of surgery will not be allowed to drive home. Must have responsible person with you x 24 hours once discharged.  Name and phone number of your driver: [name] -spouse [phone] cell  Special Instructions: CHG(Chlorhedine 4%-"Hibiclens","Betasept","Aplicare") Shower Use Special Wash: see special instructions.(avoid face and genitals)        ___________________    [facility] - Preparing for  Surgery Before surgery, you can play an important role.  Because skin is not sterile, your skin needs to be as free of germs as possible.  You can reduce the number of germs on your skin by washing with CHG (chlorahexidine gluconate) soap before surgery.  CHG is an antiseptic cleaner which kills germs and bonds with the skin to continue killing germs even after washing. Please DO NOT use if you have an allergy to CHG or antibacterial soaps.  If your skin becomes reddened/irritated stop using the CHG and inform your nurse when you arrive at Short Stay. Do not shave (including legs and underarms) for at least 48 hours prior to the first CHG shower.  You may shave your face/neck. Please follow these instructions carefully:  1.  Shower with CHG Soap the night before surgery and the  morning of Surgery.  2.  If you choose to wash your hair, wash your hair first as usual with your  normal  shampoo.  3.  After you shampoo, rinse your hair and body thoroughly to remove the  shampoo.                           4.  Use CHG as you would any other liquid soap.  You can apply chg directly  to the skin and wash                       Gently with a scrungie or clean washcloth.  5.  Apply the CHG Soap to your  body ONLY FROM THE NECK DOWN.   Do not use on face/ open                           Wound or open sores. Avoid contact with eyes, ears mouth and genitals (private parts).                       Wash face,  Genitals (private parts) with your normal soap.             6.  Wash thoroughly, paying special attention to the area where your surgery  will be performed.  7.  Thoroughly rinse your body with warm water from the neck down.  8.  DO NOT shower/wash with your normal soap after using and rinsing off  the CHG Soap.                9.  Pat yourself dry with a clean towel.            10.  Wear clean pajamas.            11.  Place clean sheets on your bed the night of your first shower and do not  sleep with pets. Day  of Surgery : Do not apply any lotions/deodorants the morning of surgery.  Please wear clean clothes to the hospital/surgery center.  FAILURE TO FOLLOW THESE INSTRUCTIONS MAY RESULT IN THE CANCELLATION OF YOUR SURGERY PATIENT SIGNATURE_________________________________  NURSE SIGNATURE__________________________________  ________________________________________________________________________

## 2014-05-02 NOTE — Pre-Procedure Instructions (Addendum)
05-02-14 CBC/d,CMP lab results in [facility]. EKG 4'15, Stress 1'15 Epic. CXR 11'14 Epic. 05-03-14 1330- MD orders entered 1008- Cardiac orders now signed with chart.

## 2014-05-02 NOTE — Progress Notes (Signed)
05-02-14 Preop appointment today for PAT -need MD order entry in [place].

## 2014-05-03 ENCOUNTER — Other Ambulatory Visit (INDEPENDENT_AMBULATORY_CARE_PROVIDER_SITE_OTHER): Payer: Self-pay | Admitting: General Surgery

## 2014-05-04 ENCOUNTER — Encounter (HOSPITAL_COMMUNITY): Payer: Self-pay | Admitting: *Deleted

## 2014-05-04 ENCOUNTER — Encounter (HOSPITAL_COMMUNITY)
Admission: RE | Disposition: A | Discharge status: Home / Self Care | Payer: Self-pay | Source: Ambulatory Visit | Attending: General Surgery

## 2014-05-04 ENCOUNTER — Ambulatory Visit (HOSPITAL_COMMUNITY)
Admission: RE | Admit: 2014-05-04 | Discharge: 2014-05-05 | Disposition: A | Discharge status: Home / Self Care | Payer: Medicare Other | Source: Ambulatory Visit | Attending: General Surgery | Admitting: General Surgery

## 2014-05-04 ENCOUNTER — Encounter (HOSPITAL_COMMUNITY): Payer: Medicare Other | Admitting: Anesthesiology

## 2014-05-04 ENCOUNTER — Ambulatory Visit (HOSPITAL_COMMUNITY): Payer: Medicare Other | Admitting: Anesthesiology

## 2014-05-04 DIAGNOSIS — J4489 Other specified chronic obstructive pulmonary disease: Secondary | ICD-10-CM | POA: Insufficient documentation

## 2014-05-04 DIAGNOSIS — Z9481 Bone marrow transplant status: Secondary | ICD-10-CM | POA: Insufficient documentation

## 2014-05-04 DIAGNOSIS — I252 Old myocardial infarction: Secondary | ICD-10-CM | POA: Insufficient documentation

## 2014-05-04 DIAGNOSIS — D176 Benign lipomatous neoplasm of spermatic cord: Secondary | ICD-10-CM | POA: Insufficient documentation

## 2014-05-04 DIAGNOSIS — Z87891 Personal history of nicotine dependence: Secondary | ICD-10-CM | POA: Insufficient documentation

## 2014-05-04 DIAGNOSIS — Z8546 Personal history of malignant neoplasm of prostate: Secondary | ICD-10-CM | POA: Insufficient documentation

## 2014-05-04 DIAGNOSIS — Z950 Presence of cardiac pacemaker: Secondary | ICD-10-CM | POA: Insufficient documentation

## 2014-05-04 DIAGNOSIS — I379 Nonrheumatic pulmonary valve disorder, unspecified: Secondary | ICD-10-CM | POA: Insufficient documentation

## 2014-05-04 DIAGNOSIS — Z7982 Long term (current) use of aspirin: Secondary | ICD-10-CM | POA: Insufficient documentation

## 2014-05-04 DIAGNOSIS — Z7902 Long term (current) use of antithrombotics/antiplatelets: Secondary | ICD-10-CM | POA: Insufficient documentation

## 2014-05-04 DIAGNOSIS — I10 Essential (primary) hypertension: Secondary | ICD-10-CM | POA: Diagnosis not present

## 2014-05-04 DIAGNOSIS — Z87898 Personal history of other specified conditions: Secondary | ICD-10-CM | POA: Insufficient documentation

## 2014-05-04 DIAGNOSIS — K4090 Unilateral inguinal hernia, without obstruction or gangrene, not specified as recurrent: Secondary | ICD-10-CM

## 2014-05-04 DIAGNOSIS — I2510 Atherosclerotic heart disease of native coronary artery without angina pectoris: Secondary | ICD-10-CM | POA: Insufficient documentation

## 2014-05-04 DIAGNOSIS — I69928 Other speech and language deficits following unspecified cerebrovascular disease: Secondary | ICD-10-CM | POA: Insufficient documentation

## 2014-05-04 DIAGNOSIS — I079 Rheumatic tricuspid valve disease, unspecified: Secondary | ICD-10-CM | POA: Insufficient documentation

## 2014-05-04 DIAGNOSIS — Z79899 Other long term (current) drug therapy: Secondary | ICD-10-CM | POA: Insufficient documentation

## 2014-05-04 DIAGNOSIS — E785 Hyperlipidemia, unspecified: Secondary | ICD-10-CM | POA: Insufficient documentation

## 2014-05-04 DIAGNOSIS — J449 Chronic obstructive pulmonary disease, unspecified: Secondary | ICD-10-CM | POA: Insufficient documentation

## 2014-05-04 DIAGNOSIS — D839 Common variable immunodeficiency, unspecified: Secondary | ICD-10-CM | POA: Insufficient documentation

## 2014-05-04 DIAGNOSIS — I739 Peripheral vascular disease, unspecified: Secondary | ICD-10-CM | POA: Insufficient documentation

## 2014-05-04 DIAGNOSIS — Z01812 Encounter for preprocedural laboratory examination: Secondary | ICD-10-CM | POA: Insufficient documentation

## 2014-05-04 HISTORY — PX: INGUINAL HERNIA REPAIR: SHX194

## 2014-05-04 SURGERY — REPAIR, HERNIA, INGUINAL, ADULT
Anesthesia: General | Laterality: Right

## 2014-05-04 MED ORDER — CEFAZOLIN SODIUM-DEXTROSE 2-3 GM-% IV SOLR
2.0000 g | INTRAVENOUS | Status: AC
  Administered 2014-05-04: 2 g via INTRAVENOUS

## 2014-05-04 MED ORDER — CEFAZOLIN SODIUM-DEXTROSE 2-3 GM-% IV SOLR
INTRAVENOUS | Status: AC
  Filled 2014-05-04: qty 50

## 2014-05-04 MED ORDER — NITROGLYCERIN 0.4 MG SL SUBL: 0.4000 mg | SUBLINGUAL_TABLET | SUBLINGUAL | Status: DC | PRN

## 2014-05-04 MED ORDER — DEXAMETHASONE SODIUM PHOSPHATE 10 MG/ML IJ SOLN
INTRAMUSCULAR | Status: DC | PRN
  Administered 2014-05-04: 10 mg via INTRAVENOUS

## 2014-05-04 MED ORDER — HEPARIN SODIUM (PORCINE) 5000 UNIT/ML IJ SOLN
5000.0000 [IU] | Freq: Three times a day (TID) | INTRAMUSCULAR | Status: DC
  Administered 2014-05-04 – 2014-05-05 (×2): 5000 [IU] via SUBCUTANEOUS
  Filled 2014-05-04 (×5): qty 1

## 2014-05-04 MED ORDER — PROPOFOL 10 MG/ML IV BOLUS
INTRAVENOUS | Status: AC
  Filled 2014-05-04: qty 20

## 2014-05-04 MED ORDER — ONDANSETRON HCL 4 MG/2ML IJ SOLN: 4.0000 mg | Freq: Four times a day (QID) | INTRAMUSCULAR | Status: DC | PRN

## 2014-05-04 MED ORDER — BUPIVACAINE-EPINEPHRINE (PF) 0.25% -1:200000 IJ SOLN
INTRAMUSCULAR | Status: AC
  Filled 2014-05-04: qty 30

## 2014-05-04 MED ORDER — ISOSORBIDE MONONITRATE ER 30 MG PO TB24
30.0000 mg | ORAL_TABLET | Freq: Every morning | ORAL | Status: DC
  Administered 2014-05-04: 30 mg via ORAL
  Filled 2014-05-04 (×2): qty 1

## 2014-05-04 MED ORDER — LACTATED RINGERS IV SOLN
INTRAVENOUS | Status: DC
  Administered 2014-05-04: 1000 mL via INTRAVENOUS

## 2014-05-04 MED ORDER — FENTANYL CITRATE 0.05 MG/ML IJ SOLN: 25.0000 ug | INTRAMUSCULAR | Status: DC | PRN

## 2014-05-04 MED ORDER — ONDANSETRON HCL 4 MG PO TABS: 4.0000 mg | ORAL_TABLET | Freq: Four times a day (QID) | ORAL | Status: DC | PRN

## 2014-05-04 MED ORDER — PANTOPRAZOLE SODIUM 40 MG PO TBEC
40.0000 mg | DELAYED_RELEASE_TABLET | Freq: Every day | ORAL | Status: DC
  Administered 2014-05-04: 40 mg via ORAL
  Filled 2014-05-04 (×2): qty 1

## 2014-05-04 MED ORDER — GABAPENTIN 600 MG PO TABS
600.0000 mg | ORAL_TABLET | Freq: Three times a day (TID) | ORAL | Status: DC
  Filled 2014-05-04 (×2): qty 1

## 2014-05-04 MED ORDER — GABAPENTIN 300 MG PO CAPS
600.0000 mg | ORAL_CAPSULE | Freq: Three times a day (TID) | ORAL | Status: DC
  Administered 2014-05-04 (×2): 600 mg via ORAL
  Filled 2014-05-04 (×5): qty 2

## 2014-05-04 MED ORDER — PROPOFOL 10 MG/ML IV BOLUS
INTRAVENOUS | Status: DC | PRN
  Administered 2014-05-04: 130 mg via INTRAVENOUS

## 2014-05-04 MED ORDER — CITALOPRAM HYDROBROMIDE 40 MG PO TABS
40.0000 mg | ORAL_TABLET | Freq: Every day | ORAL | Status: DC
  Administered 2014-05-04: 40 mg via ORAL
  Filled 2014-05-04 (×2): qty 1

## 2014-05-04 MED ORDER — OXYCODONE-ACETAMINOPHEN 5-325 MG PO TABS
1.0000 | ORAL_TABLET | ORAL | Status: DC | PRN
  Administered 2014-05-04 – 2014-05-05 (×3): 1 via ORAL
  Filled 2014-05-04 (×3): qty 1

## 2014-05-04 MED ORDER — AMLODIPINE BESYLATE 10 MG PO TABS
10.0000 mg | ORAL_TABLET | Freq: Every morning | ORAL | Status: DC
  Filled 2014-05-04: qty 1

## 2014-05-04 MED ORDER — LIDOCAINE HCL (CARDIAC) 20 MG/ML IV SOLN
INTRAVENOUS | Status: AC
  Filled 2014-05-04: qty 5

## 2014-05-04 MED ORDER — ALPRAZOLAM 0.5 MG PO TABS: 0.5000 mg | ORAL_TABLET | Freq: Every evening | ORAL | Status: DC | PRN

## 2014-05-04 MED ORDER — ONDANSETRON HCL 4 MG/2ML IJ SOLN
INTRAMUSCULAR | Status: AC
  Filled 2014-05-04: qty 2

## 2014-05-04 MED ORDER — POTASSIUM CHLORIDE CRYS ER 20 MEQ PO TBCR
20.0000 meq | EXTENDED_RELEASE_TABLET | Freq: Three times a day (TID) | ORAL | Status: DC
  Administered 2014-05-04 (×2): 20 meq via ORAL
  Filled 2014-05-04 (×6): qty 1

## 2014-05-04 MED ORDER — TEMAZEPAM 15 MG PO CAPS: 15.0000 mg | ORAL_CAPSULE | Freq: Every evening | ORAL | Status: DC | PRN

## 2014-05-04 MED ORDER — ROCURONIUM BROMIDE 100 MG/10ML IV SOLN
INTRAVENOUS | Status: AC
  Filled 2014-05-04: qty 1

## 2014-05-04 MED ORDER — DEXAMETHASONE SODIUM PHOSPHATE 10 MG/ML IJ SOLN
INTRAMUSCULAR | Status: AC
  Filled 2014-05-04: qty 1

## 2014-05-04 MED ORDER — FENTANYL CITRATE 0.05 MG/ML IJ SOLN
INTRAMUSCULAR | Status: AC
  Filled 2014-05-04: qty 2

## 2014-05-04 MED ORDER — SODIUM CHLORIDE 0.9 % IV SOLN: INTRAVENOUS | Status: DC

## 2014-05-04 MED ORDER — 0.9 % SODIUM CHLORIDE (POUR BTL) OPTIME
TOPICAL | Status: DC | PRN
  Administered 2014-05-04: 1000 mL

## 2014-05-04 MED ORDER — FENTANYL CITRATE 0.05 MG/ML IJ SOLN
INTRAMUSCULAR | Status: DC | PRN
  Administered 2014-05-04 (×4): 25 ug via INTRAVENOUS

## 2014-05-04 MED ORDER — BUPIVACAINE-EPINEPHRINE 0.25% -1:200000 IJ SOLN
INTRAMUSCULAR | Status: DC | PRN
  Administered 2014-05-04: 30 mL

## 2014-05-04 MED ORDER — CHLORHEXIDINE GLUCONATE 4 % EX LIQD: 1.0000 "application " | Freq: Once | CUTANEOUS | Status: DC

## 2014-05-04 MED ORDER — SIMVASTATIN 20 MG PO TABS
20.0000 mg | ORAL_TABLET | Freq: Every day | ORAL | Status: DC
  Administered 2014-05-04: 20 mg via ORAL
  Filled 2014-05-04 (×2): qty 1

## 2014-05-04 MED ORDER — LENALIDOMIDE 10 MG PO CAPS: 10.0000 mg | ORAL_CAPSULE | Freq: Every day | ORAL | Status: DC

## 2014-05-04 MED ORDER — LENALIDOMIDE 10 MG PO CAPS
10.0000 mg | ORAL_CAPSULE | Freq: Every day | ORAL | Status: DC
Start: 2014-05-04 — End: 2014-05-04

## 2014-05-04 MED ORDER — PROMETHAZINE HCL 25 MG/ML IJ SOLN: 6.2500 mg | INTRAMUSCULAR | Status: DC | PRN

## 2014-05-04 MED ORDER — KETOROLAC TROMETHAMINE 30 MG/ML IJ SOLN: 15.0000 mg | Freq: Once | INTRAMUSCULAR | Status: DC | PRN

## 2014-05-04 MED ORDER — TRAMADOL HCL 50 MG PO TABS: 50.0000 mg | ORAL_TABLET | Freq: Four times a day (QID) | ORAL | Status: DC | PRN

## 2014-05-04 SURGICAL SUPPLY — 49 items
ADH SKN CLS APL DERMABOND .7 (GAUZE/BANDAGES/DRESSINGS) ×1
BLADE HEX COATED 2.75 (ELECTRODE) ×3 IMPLANT
BLADE SURG 15 STRL LF DISP TIS (BLADE) ×1 IMPLANT
BLADE SURG 15 STRL SS (BLADE)
BLADE SURG SZ10 CARB STEEL (BLADE) ×3 IMPLANT
CANISTER SUCTION 2500CC (MISCELLANEOUS) ×3 IMPLANT
CHLORAPREP W/TINT 26ML (MISCELLANEOUS) ×2 IMPLANT
CLOSURE WOUND 1/2 X4 (GAUZE/BANDAGES/DRESSINGS)
DECANTER SPIKE VIAL GLASS SM (MISCELLANEOUS) IMPLANT
DERMABOND ADVANCED (GAUZE/BANDAGES/DRESSINGS) ×2
DERMABOND ADVANCED .7 DNX12 (GAUZE/BANDAGES/DRESSINGS) ×1 IMPLANT
DRAIN PENROSE 18X1/2 LTX STRL (DRAIN) ×3 IMPLANT
DRAPE INCISE IOBAN 66X45 STRL (DRAPES) ×3 IMPLANT
DRAPE LAPAROTOMY TRNSV 102X78 (DRAPE) ×3 IMPLANT
ELECT REM PT RETURN 9FT ADLT (ELECTROSURGICAL) ×3
ELECTRODE REM PT RTRN 9FT ADLT (ELECTROSURGICAL) ×1 IMPLANT
GAUZE SPONGE 4X4 12PLY STRL (GAUZE/BANDAGES/DRESSINGS) IMPLANT
GLOVE BIOGEL PI IND STRL 7.0 (GLOVE) ×1 IMPLANT
GLOVE BIOGEL PI IND STRL 7.5 (GLOVE) ×1 IMPLANT
GLOVE BIOGEL PI INDICATOR 7.0 (GLOVE) ×2
GLOVE BIOGEL PI INDICATOR 7.5 (GLOVE) ×2
GLOVE SS BIOGEL STRL SZ 7.5 (GLOVE) ×1 IMPLANT
GLOVE SUPERSENSE BIOGEL SZ 7.5 (GLOVE) ×2
GOWN STRL REUS W/TWL LRG LVL3 (GOWN DISPOSABLE) ×3 IMPLANT
GOWN STRL REUS W/TWL XL LVL3 (GOWN DISPOSABLE) ×8 IMPLANT
KIT BASIN OR (CUSTOM PROCEDURE TRAY) ×3 IMPLANT
MESH PROLENE 3X6 (Mesh General) ×2 IMPLANT
NDL HYPO 25X1 1.5 SAFETY (NEEDLE) ×1 IMPLANT
NEEDLE HYPO 22GX1.5 SAFETY (NEEDLE) IMPLANT
NEEDLE HYPO 25X1 1.5 SAFETY (NEEDLE) ×3 IMPLANT
NS IRRIG 1000ML POUR BTL (IV SOLUTION) ×3 IMPLANT
PACK BASIC VI WITH GOWN DISP (CUSTOM PROCEDURE TRAY) ×3 IMPLANT
PENCIL BUTTON HOLSTER BLD 10FT (ELECTRODE) ×3 IMPLANT
SPONGE LAP 4X18 X RAY DECT (DISPOSABLE) ×3 IMPLANT
STRIP CLOSURE SKIN 1/2X4 (GAUZE/BANDAGES/DRESSINGS) IMPLANT
SUT MNCRL AB 4-0 PS2 18 (SUTURE) ×3 IMPLANT
SUT PROLENE 2 0 CT2 30 (SUTURE) ×6 IMPLANT
SUT SILK 2 0 SH (SUTURE) ×3 IMPLANT
SUT VIC AB 3-0 54XBRD REEL (SUTURE) ×1 IMPLANT
SUT VIC AB 3-0 BRD 54 (SUTURE) ×3
SUT VIC AB 3-0 CT1 27 (SUTURE) ×3
SUT VIC AB 3-0 CT1 TAPERPNT 27 (SUTURE) ×1 IMPLANT
SUT VIC AB 3-0 SH 27 (SUTURE)
SUT VIC AB 3-0 SH 27XBRD (SUTURE) IMPLANT
SYR BULB IRRIGATION 50ML (SYRINGE) ×3 IMPLANT
SYR CONTROL 10ML LL (SYRINGE) ×3 IMPLANT
TOWEL OR 17X26 10 PK STRL BLUE (TOWEL DISPOSABLE) ×3 IMPLANT
TOWEL OR NON WOVEN STRL DISP B (DISPOSABLE) ×2 IMPLANT
YANKAUER SUCT BULB TIP 10FT TU (MISCELLANEOUS) ×1 IMPLANT

## 2014-05-04 NOTE — Anesthesia Postprocedure Evaluation (Signed)
  Anesthesia Post-op Note  Patient: Jeremy Parrish  Procedure(s) Performed: Procedure(s) (LRB): OPEN RIGHT INGUINAL HERNIA REPAIR with mesh (Right)  Patient Location: PACU  Anesthesia Type: General  Level of Consciousness: awake and alert   Airway and Oxygen Therapy: Patient Spontanous Breathing  Post-op Pain: mild  Post-op Assessment: Post-op Vital signs reviewed, Patient's Cardiovascular Status Stable, Respiratory Function Stable, Patent Airway and No signs of Nausea or vomiting  Last Vitals:  Filed Vitals:   05/04/14 1315  BP: 121/70  Pulse:   Temp: 36.7 C  Resp:     Post-op Vital Signs: stable   Complications: No apparent anesthesia complications

## 2014-05-04 NOTE — Interval H&P Note (Signed)
History and Physical Interval Note:  05/04/2014 10:43 AM  Jeremy Parrish  has presented today for surgery, with the diagnosis of right inguinal hernia  The various methods of treatment have been discussed with the patient and family. After consideration of risks, benefits and other options for treatment, the patient has consented to  Procedure(s): OPEN RIGHT INGUINAL HERNIA REPAIR (Right) as a surgical intervention .  The patient's history has been reviewed, patient examined, no change in status, stable for surgery.  I have reviewed the patient's chart and labs.  Questions were answered to the patient's satisfaction.     HOXWORTH,BENJAMIN T

## 2014-05-04 NOTE — Anesthesia Preprocedure Evaluation (Addendum)
Anesthesia Evaluation  Patient identified by MRN, date of birth, ID band Patient awake    Reviewed: Allergy & Precautions, H&P , NPO status , Patient's Chart, lab work & pertinent test results  Airway Mallampati: II TM Distance: >3 FB Neck ROM: Full    Dental no notable dental hx.    Pulmonary COPDformer smoker,  breath sounds clear to auscultation  Pulmonary exam normal       Cardiovascular hypertension, + CAD, + Past MI, + Cardiac Stents, + Peripheral Vascular Disease and +CHF + pacemaker + Valvular Problems/Murmurs Rhythm:Irregular Rate:Normal + Diastolic murmurs- Systolic murmurs Tricuspid valve: Moderate regurgitation. - Pulmonic valve: Moderate regurgitation   Left ventricle: The cavity size was mildly to moderately   dilated. There was mild focal basal hypertrophy of the   septum, with an appearance of severe eccentric   hypertrophy. Systolic function was moderately reduced. The   estimated ejection fraction was in the range of 35% to   40%. The study is not technically sufficient to allow   evaluation of LV diastolic function, due to significant   ectopy.    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 and their treatments    Anesthesia Other Findings   Reproductive/Obstetrics negative OB ROS                        Anesthesia Physical Anesthesia Plan  ASA: III  Anesthesia Plan: General   Post-op Pain Management:    Induction: Intravenous  Airway Management Planned: Oral ETT and LMA  Additional Equipment:   Intra-op Plan:   Post-operative  Plan: Extubation in OR  Informed Consent: I have reviewed the patients History and Physical, chart, labs and discussed the procedure including the risks, benefits and alternatives for the proposed anesthesia with the patient or authorized representative who has indicated his/her understanding and acceptance.   Dental advisory given  Plan Discussed with: CRNA and Surgeon  Anesthesia Plan Comments:         Anesthesia Quick Evaluation

## 2014-05-04 NOTE — H&P (View-Only) (Signed)
Subjective:   worsening right groin pain  Patient ID: Jeremy Parrish, male   DOB: 03/15/1941, 72 y.o.   MRN: [id number]  HPI Patient returns to the office. I had seen him approximately one year ago some intermittent right groin pain. At that time his physical exam was not really remarkable in terms of an inguinal hernia but we did get a CT scan showing a small fat-containing right inguinal hernia. We felt that likely this was the source of his pain but at that point it was not bad enough that he wanted to go ahead with surgery. However over recent months he has been having increasing pain in his right groin with routine physical activity coughing etc. No GI symptoms. He's not noted any large bulge.  Past Medical History  Diagnosis Date  . Hypertension   . Heart disease   . Kidney stones     history  . Lung mass   . Heart murmur   . 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 Coronary artery disease   . Depression   . Shortness of breath   . Peripheral arterial disease   . Bladder neck contracture   . Personal history of other diseases of circulatory system   . Aortic aneurysm of unspecified site without mention of rupture   . Arthritis   . Intestinovesical fistula   . Esophageal reflux   . Hyperlipidemia   . Anemia   . CHF (congestive heart failure)   . COPD (chronic obstructive pulmonary disease)   . Myocardial infarction   . Cerebral atherosclerosis     Carotid Doppler, 02/16/2013 - Bilateral Proximal ICAs,demonstrate mild plaque w/o evidence of significant diameter reduction, dissection, or any other vascular abnormality  . Complication of anesthesia   . PONV (postoperative nausea and vomiting)   . Stroke 2013    Speech.  . Cancer   . Prostate cancer 2000  . Multiple myeloma   . Hx of bladder cancer 10/07/2012  . Non Hodgkin's lymphoma    Past Surgical History  Procedure Laterality Date  . Prostate surgery    .  Heart stents x 5  1999  . Portacath placement  07/26/2009  . Wrist surgery      right  . Left ear skin cancer removed    . Bone marrow transplant  2011  . Pacemaker insertion  07/22/2011    [employer]  . Coronary angioplasty  06/24/2000    PCI and stenting in mid & proximal RCA  . Insert / replace / remove pacemaker    . Tee without cardioversion  10/13/2012    Procedure: TRANSESOPHAGEAL ECHOCARDIOGRAM (TEE);  Surgeon: Mihai Croitoru, MD;  Location: [facility] ENDOSCOPY;  Service: Cardiovascular;  Laterality: N/A;  pat/kay/echo notified  . Colonoscopy N/A 01/01/2013    Procedure: COLONOSCOPY;  Surgeon: Najeeb U [name], MD;  Location: [facility] ENDO SUITE;  Service: Endoscopy;  Laterality: N/A;  825-moved to 940 Ann notified pt  . Rotator    . Rotator cuff repair Right   . Colon surgery      colon resection  . Bladder surgery    . Shoulder arthroscopy with subacromial decompression Right 07/21/2013    Procedure: RIGHT SHOULDER ARTHROSCOPY WITH SUBACROMIAL DECOMPRESSION AND DEBRIDEMENT & Injection of Left Shoulder;  Surgeon: John L Graves, MD;  Location: [facility] OR;  Service: Orthopedics;  Laterality: Right;  . Us echocardiography  06/19/2011    RV mildly dilated,mild to mod. MR,mild AI,mild PI  .   Nm myocar perf wall motion  11/27/2007    inferior scar   Current Outpatient Prescriptions  Medication Sig Dispense Refill  . ALPRAZolam (XANAX) 0.5 MG tablet Take 1 tablet (0.5 mg total) by mouth at bedtime as needed for anxiety.  30 tablet  5  . amLODipine (NORVASC) 10 MG tablet Take 10 mg by mouth daily.        . aspirin 81 MG tablet Take 81 mg by mouth daily.      . azithromycin (ZITHROMAX) 250 MG tablet As directed  6 each  0  . citalopram (CELEXA) 40 MG tablet TAKE 1 TABLET BY MOUTH EVERY DAY  90 tablet  0  . clopidogrel (PLAVIX) 75 MG tablet TAKE 1 TABLET BY MOUTH EVERY DAY  90 tablet  3  . gabapentin (NEURONTIN) 600 MG tablet Take 1 tablet (600 mg total) by mouth 3 (three) times daily.  90 tablet  2  .  ibuprofen (ADVIL,MOTRIN) 200 MG tablet Take 200 mg by mouth every 6 (six) hours as needed.      . isosorbide mononitrate (IMDUR) 30 MG 24 hr tablet Take 1 tablet (30 mg total) by mouth daily.  30 tablet  6  . lenalidomide (REVLIMID) 10 MG capsule Take 1 capsule (10 mg total) by mouth daily. Take 1 capsule by mouth daily for 7 days on followed by 7 days off  14 capsule  0  . nitroGLYCERIN (NITROSTAT) 0.4 MG SL tablet Place 1 tablet (0.4 mg total) under the tongue every 5 (five) minutes as needed for chest pain.  25 tablet  12  . omeprazole (PRILOSEC) 20 MG capsule TAKE 1 CAPSULE EVERY DAY  90 capsule  0  . oxyCODONE-acetaminophen (PERCOCET/ROXICET) 5-325 MG per tablet Take 1 tablet by mouth every 6 (six) hours as needed.  60 tablet  0  . potassium chloride SA (KLOR-CON M20) 20 MEQ tablet Take 1 tablet (20 mEq total) by mouth 3 (three) times daily.  90 tablet  1  . simvastatin (ZOCOR) 20 MG tablet Take 20 mg by mouth at bedtime.        . temazepam (RESTORIL) 15 MG capsule Take 1-2 capsules (15-30 mg total) by mouth at bedtime as needed for sleep.  60 capsule  0  . traMADol (ULTRAM) 50 MG tablet Take 50 mg by mouth every 6 (six) hours as needed.      . Zoledronic Acid (ZOMETA) 4 MG/100ML IVPB Inject 4 mg into the vein every 30 (thirty) days.       No current facility-administered medications for this visit.   Allergies  Allergen Reactions  . Diphenhydramine Hcl     REACTION: "hyper"  . Morphine And Related Other (See Comments)    hallucinations  . Tape Rash    Paper tape is ok   History  Substance Use Topics  . Smoking status: Former Smoker -- 1.00 packs/day for 20 years    Types: Cigarettes    Quit date: 11/14/1994  . Smokeless tobacco: Never Used  . Alcohol Use: No     Comment: previously drank but none for at least 15 years.     Review of Systems  Constitutional: Negative for fever, chills and activity change.  Respiratory: Positive for shortness of breath. Negative for chest  tightness and wheezing.   Cardiovascular: Negative.   Gastrointestinal: Negative.   Neurological: Positive for speech difficulty.       Objective:   Physical Exam BP 132/80  Pulse 90  Temp(Src) 97.5 F (36.4   C)  Ht 5' 8" (1.727 m)  Wt 192 lb (87.091 kg)  BMI 29.20 kg/m2 General: Alert, well-developed Caucasian male, dysarthric speech, in no distress Skin: Warm and dry without rash or infection. Solar damage on extremities and face HEENT: No palpable masses or thyromegaly. Sclera nonicteric. Pupils equal round and reactive. Oropharynx clear. Lymph nodes: there is moderate bilateral palpable axillary adenopathy. No cervical or inguinal adenopathy noted.. Lungs: Breath sounds clear and equal without increased work of breathing Cardiovascular: Regular rate and rhythm without murmur. No JVD or edema. Peripheral pulses intact. Abdomen: Nondistended. Soft and nontender. No masses palpable. No organomegaly .there is a small but definite and tender right inguinal hernia with the patient coughing or straining Extremities: No edema or joint swelling or deformity. No chronic venous stasis changes. Neurologic: Alert and fully oriented. Dysarthric speech. Gait normal. No focal deficits.     Assessment:     Worsening symptomatic right inguinal hernia. The patient has multiple medical problems but these appear to be relatively stable and he is quite active. The pain is a daily problem for him and interfering with his activity. A long discussion with the patient and his wife regarding pros and cons of repair. He was certainly be at some increased risk due to his multiple medical problems. We would need to obtain cardiac clearance. However he feels that his pain is interfering with his activity and a daily problem for him and therefore think it would be reasonable to proceed with repair of his right inguinal hernia. We discussed the nature of the surgery and expected recovery as well as complications and  anesthetic complications, cardiorespiratory complications, bleeding, infection, recurrence and chronic pain.     Plan:     Scheduled for open repair of right inguinal hernia with overnight hospitalization under general anesthesia after cardiac clearance.       

## 2014-05-04 NOTE — Op Note (Signed)
Preoperative Diagnosis: Right inguinal hernia  Postoperative diagnosis: Same  Procedure: Open repair of right inguinal hernia with mesh  Surgeon: Excell Seltzer M.D.  Anesthesia: LMA general  Estimated blood loss: Minimal  Complications: None  Description of procedure: Patient was brought to the operating room, placed in supine position on the operating table. LMA general anesthesia was induced by anesthesiology. The groin was widely sterilely prepped and draped. The patient received preoperative IV antibiotics. Patient timeout was performed and correct procedure verified. An oblique incision was made in the groin and dissection carried down through the subcutaneous tissue using cautery. The external oblique was identified and cleared down to the inguinal ligament. The external oblique was divided along the lines of its fibers through the external ring. The cord was dissected up off the floor the pubic tubercle. Cremasteric fibers were divided bilaterally freeing the cord to the internal ring.  Ileoinguinal nerve was dissected and protected. The floor was intact. The internal ring was dilated. An cord lipoma was identified associated with the cord structures it was dissected completely free from cord structures using blunt and cautery dissection up to the internal ring and excised. No peritoneal sac was identified. A piece of Prolene mesh was chosen and trimmed to size to fit the floor of the inguinal canal with tails to go around the cord at the internal ring. The mesh was sutured initially to the pubic tubercle and then to the iliopubic tract and inguinal ligament working medial to lateral with 2-0 Prolene until the suture was lateral to the internal ring. Medially the mesh was sutured to the rectus sheath with interrupted 2-0 Prolene. The tails were then tacked together lateral to the internal ring creating a new internal ring snug to a fingertip. This provided nice broad coverage of the direct  and indirect spaces. The soft tissue was then infiltrated with local anesthesia. The cord was returned to its anatomic position. There was no bleeding. The external oblique was closed with running 3-0 Vicryl. Scarpa's fascia was closed with running 3-0 Vicryl. The skin was closed with subcuticular Monocryl and Dermabond. Sponge needle and instrument counts were correct. The patient was taken to recovery in good condition.  HOXWORTH,BENJAMIN T  05/04/2014

## 2014-05-04 NOTE — Transfer of Care (Signed)
Immediate Anesthesia Transfer of Care Note  Patient: Jeremy Parrish  Procedure(s) Performed: Procedure(s) (LRB): OPEN RIGHT INGUINAL HERNIA REPAIR with mesh (Right)  Patient Location: PACU  Anesthesia Type: General  Level of Consciousness: sedated, patient cooperative and responds to stimulation  Airway & Oxygen Therapy: Patient Spontanous Breathing and Patient connected to face mask oxgen  Post-op Assessment: Report given to PACU RN and Post -op Vital signs reviewed and stable  Post vital signs: Reviewed and stable  Complications: No apparent anesthesia complications

## 2014-05-05 ENCOUNTER — Encounter (HOSPITAL_COMMUNITY): Payer: Self-pay | Admitting: General Surgery

## 2014-05-05 MED ORDER — OXYCODONE-ACETAMINOPHEN 5-325 MG PO TABS: 1.0000 | ORAL_TABLET | ORAL | Status: DC | PRN

## 2014-05-05 NOTE — Progress Notes (Signed)
Pt d/ced home with wife 

## 2014-05-05 NOTE — Discharge Summary (Signed)
Patient ID: HORRACE HANAK [id number] 72 y.o. 1940/12/15  05/04/2014  Discharge date and time: 05/05/2014   Admitting Physician: Excell Seltzer T  Discharge Physician: Excell Seltzer T  Admission Diagnoses: right inguinal hernia  Discharge Diagnoses: same  Operations: Procedure(s): OPEN RIGHT INGUINAL HERNIA REPAIR with mesh  Admission Condition: good  Discharged Condition: good  Indication for Admission: patient is a 73 year old male with multiple medical problems who presents with a symptomatic right inguinal hernia confirmed on exam. He is electively admitted for overnight observation following repair due to his multiple medical problems.  Hospital Course: on the morning of admission he underwent an uneventful right inguinal hernia repair. Tolerated the procedure without complication. The following morning he has mild pain. Has been able to void. Tolerating a diet. Abdomen is nontender his wound is healing well. He is felt ready for discharge.  Disposition: Home  Patient Instructions:    Medication List         ALPRAZolam 0.5 MG tablet  Commonly known as:  XANAX  Take 1 tablet (0.5 mg total) by mouth at bedtime as needed for anxiety.     amLODipine 10 MG tablet  Commonly known as:  NORVASC  Take 10 mg by mouth every morning.     aspirin EC 81 MG tablet  Take 81 mg by mouth at bedtime.     citalopram 40 MG tablet  Commonly known as:  CELEXA  Take 40 mg by mouth at bedtime.     clopidogrel 75 MG tablet  Commonly known as:  PLAVIX  Take 75 mg by mouth every morning.     gabapentin 600 MG tablet  Commonly known as:  NEURONTIN  Take 1 tablet (600 mg total) by mouth 3 (three) times daily.     ibuprofen 200 MG tablet  Commonly known as:  ADVIL,MOTRIN  Take 200 mg by mouth every 6 (six) hours as needed (Pain).     isosorbide mononitrate 30 MG 24 hr tablet  Commonly known as:  IMDUR  Take 30 mg by mouth every morning.     lenalidomide 10 MG capsule   Commonly known as:  REVLIMID  Take 1 capsule (10 mg total) by mouth daily. Take 1 capsule by mouth daily for 7 days on followed by 7 days off     nitroGLYCERIN 0.4 MG SL tablet  Commonly known as:  NITROSTAT  Place 1 tablet (0.4 mg total) under the tongue every 5 (five) minutes as needed for chest pain.     omeprazole 20 MG capsule  Commonly known as:  PRILOSEC  Take 20 mg by mouth every morning.     oxyCODONE-acetaminophen 5-325 MG per tablet  Commonly known as:  PERCOCET/ROXICET  Take 1 tablet by mouth every 4 (four) hours as needed for severe pain.     potassium chloride SA 20 MEQ tablet  Commonly known as:  KLOR-CON M20  Take 1 tablet (20 mEq total) by mouth 3 (three) times daily.     simvastatin 20 MG tablet  Commonly known as:  ZOCOR  Take 20 mg by mouth at bedtime.     temazepam 15 MG capsule  Commonly known as:  RESTORIL  Take 1-2 capsules (15-30 mg total) by mouth at bedtime as needed for sleep.     traMADol 50 MG tablet  Commonly known as:  ULTRAM  Take 50 mg by mouth every 6 (six) hours as needed (Pain).     Vitamin D3 400 UNITS Caps  Take 400 Units by  mouth daily.     ZOMETA 4 MG/100ML IVPB  Generic drug:  Zoledronic Acid  Inject 4 mg into the vein every 30 (thirty) days.        Activity: no heavy lifting for 4 weeks Diet: regular diet Wound Care: none needed  Follow-up:  With Dr. Excell Seltzer in 3 weeks.  Signed: Edward Jolly MD, FACS  05/05/2014, 7:05 AM

## 2014-05-05 NOTE — Discharge Instructions (Signed)
[facility] _______[facility]  UMBILICAL OR INGUINAL HERNIA REPAIR: POST OP INSTRUCTIONS  Always review your discharge instruction sheet given to you by the facility where your surgery was performed. IF YOU HAVE DISABILITY OR FAMILY LEAVE FORMS, YOU MUST BRING THEM TO THE OFFICE FOR PROCESSING.   DO NOT GIVE THEM TO YOUR DOCTOR.  1. A  prescription for pain medication may be given to you upon discharge.  Take your pain medication as prescribed, if needed.  If narcotic pain medicine is not needed, then you may take acetaminophen (Tylenol) or ibuprofen (Advil) as needed. 2. Take your usually prescribed medications unless otherwise directed. 3. If you need a refill on your pain medication, please contact your pharmacy.  They will contact our office to request authorization. Prescriptions will not be filled after 5 pm or on week-ends. 4. You should follow a light diet the first 24 hours after arrival home, such as soup and crackers, etc.  Be sure to include lots of fluids daily.  Resume your normal diet the day after surgery. 5. Most patients will experience some swelling and bruising around the umbilicus or in the groin and scrotum.  Ice packs and reclining will help.  Swelling and bruising can take several days to resolve.  6. It is common to experience some constipation if taking pain medication after surgery.  Increasing fluid intake and taking a stool softener (such as Colace) will usually help or prevent this problem from occurring.  A mild laxative (Milk of Magnesia or Miralax) should be taken according to package directions if there are no bowel movements after 48 hours. 7. Unless discharge instructions indicate otherwise, you may remove your bandages 24-48 hours after surgery, and you may shower at that time.  You may have steri-strips (small skin tapes) in place directly over the incision.  These strips should be left on the skin for 7-10 days.  If your surgeon used skin glue on the  incision, you may shower in 24 hours.  The glue will flake off over the next 2-3 weeks.  Any sutures or staples will be removed at the office during your follow-up visit. 8. ACTIVITIES:  You may resume regular (light) daily activities beginning the next day--such as daily self-care, walking, climbing stairs--gradually increasing activities as tolerated.  You may have sexual intercourse when it is comfortable.  Refrain from any heavy lifting or straining until approved by your doctor. a. You may drive when you are no longer taking prescription pain medication, you can comfortably wear a seatbelt, and you can safely maneuver your car and apply brakes. b. RETURN TO WORK:  __________________________________________________________ 9. You should see your doctor in the office for a follow-up appointment approximately 2-3 weeks after your surgery.  Make sure that you call for this appointment within a day or two after you arrive home to insure a convenient appointment time. 10. OTHER INSTRUCTIONS:  __________________________________________________________________________________________________________________________________________________________________________________________  WHEN TO CALL YOUR DOCTOR: 1. Fever over 101.0 2. Inability to urinate 3. Nausea and/or vomiting 4. Extreme swelling or bruising 5. Continued bleeding from incision. 6. Increased pain, redness, or drainage from the incision  The clinic staff is available to answer your questions during regular business hours.  Please don't hesitate to call and ask to speak to one of the nurses for clinical concerns.  If you have a medical emergency, go to the nearest emergency room or call 911.  A surgeon from [facility] is always on call at the hospital     [address], [address], [place], [place]  [address] ?  [address] [address], [place], [place]   [address] [phone] ? [phone] ? FAX [phone] Web site:  [email or web address]  

## 2014-05-06 ENCOUNTER — Other Ambulatory Visit: Payer: Self-pay | Admitting: Family Medicine

## 2014-05-12 ENCOUNTER — Other Ambulatory Visit (HOSPITAL_COMMUNITY): Payer: Self-pay | Admitting: Oncology

## 2014-05-12 DIAGNOSIS — C9000 Multiple myeloma not having achieved remission: Secondary | ICD-10-CM

## 2014-05-12 MED ORDER — LENALIDOMIDE 10 MG PO CAPS: 10.0000 mg | ORAL_CAPSULE | Freq: Every day | ORAL | Status: DC

## 2014-05-16 ENCOUNTER — Other Ambulatory Visit: Payer: Self-pay

## 2014-05-16 ENCOUNTER — Other Ambulatory Visit: Payer: Self-pay | Admitting: *Deleted

## 2014-05-16 DIAGNOSIS — C9000 Multiple myeloma not having achieved remission: Secondary | ICD-10-CM

## 2014-05-16 MED ORDER — SIMVASTATIN 20 MG PO TABS: 20.0000 mg | ORAL_TABLET | Freq: Every day | ORAL | Status: DC

## 2014-05-16 NOTE — Telephone Encounter (Signed)
Rx was sent to pharmacy electronically. 

## 2014-05-17 ENCOUNTER — Telehealth: Payer: Self-pay | Admitting: Cardiovascular Disease

## 2014-05-17 NOTE — Telephone Encounter (Signed)
Patient states he had sleep study about 2 months ago Patient states he had study at [facility]. RN informed patient will call back with more details.  CALLED LEFT MESSAGE on voicemail - to call back ([phone] resp/sleep)-awaiting for call back

## 2014-05-17 NOTE — Telephone Encounter (Signed)
Lattie Haw at [facility] called and stated results are in [facility]  "UNDER NOTES TAB 03/25/14" Will discuss with a Dr Kennon Holter nurse and Dr Claiborne Billings 's CMA on the next step.

## 2014-05-17 NOTE — Telephone Encounter (Signed)
Pt wants you to call him,says he still have not heard from his sleep study.

## 2014-05-18 ENCOUNTER — Encounter (HOSPITAL_COMMUNITY): Payer: Self-pay

## 2014-05-18 ENCOUNTER — Encounter (HOSPITAL_BASED_OUTPATIENT_CLINIC_OR_DEPARTMENT_OTHER): Payer: Medicare Other

## 2014-05-18 ENCOUNTER — Encounter (HOSPITAL_COMMUNITY): Payer: Medicare Other | Attending: Oncology

## 2014-05-18 VITALS — BP 117/67 | HR 61 | Temp 97.4°F | Resp 20 | Wt 189.8 lb

## 2014-05-18 DIAGNOSIS — Z85528 Personal history of other malignant neoplasm of kidney: Secondary | ICD-10-CM

## 2014-05-18 DIAGNOSIS — C9000 Multiple myeloma not having achieved remission: Secondary | ICD-10-CM | POA: Insufficient documentation

## 2014-05-18 DIAGNOSIS — M810 Age-related osteoporosis without current pathological fracture: Secondary | ICD-10-CM

## 2014-05-18 DIAGNOSIS — C9001 Multiple myeloma in remission: Secondary | ICD-10-CM

## 2014-05-18 DIAGNOSIS — D801 Nonfamilial hypogammaglobulinemia: Secondary | ICD-10-CM

## 2014-05-18 DIAGNOSIS — K529 Noninfective gastroenteritis and colitis, unspecified: Secondary | ICD-10-CM

## 2014-05-18 DIAGNOSIS — Z85828 Personal history of other malignant neoplasm of skin: Secondary | ICD-10-CM | POA: Diagnosis not present

## 2014-05-18 DIAGNOSIS — C8589 Other specified types of non-Hodgkin lymphoma, extranodal and solid organ sites: Secondary | ICD-10-CM | POA: Diagnosis not present

## 2014-05-18 LAB — CBC WITH DIFFERENTIAL/PLATELET
BASOS ABS: 0 10*3/uL (ref 0.0–0.1)
BASOS PCT: 1 % (ref 0–1)
Eosinophils Absolute: 0.2 10*3/uL (ref 0.0–0.7)
Eosinophils Relative: 6 % — ABNORMAL HIGH (ref 0–5)
HCT: 37 % — ABNORMAL LOW (ref 39.0–52.0)
HEMOGLOBIN: 12.3 g/dL — AB (ref 13.0–17.0)
Lymphocytes Relative: 35 % (ref 12–46)
Lymphs Abs: 1.4 10*3/uL (ref 0.7–4.0)
MCH: 29.1 pg (ref 26.0–34.0)
MCHC: 33.2 g/dL (ref 30.0–36.0)
MCV: 87.7 fL (ref 78.0–100.0)
MONOS PCT: 7 % (ref 3–12)
Monocytes Absolute: 0.3 10*3/uL (ref 0.1–1.0)
NEUTROS PCT: 51 % (ref 43–77)
Neutro Abs: 2 10*3/uL (ref 1.7–7.7)
Platelets: 131 10*3/uL — ABNORMAL LOW (ref 150–400)
RBC: 4.22 MIL/uL (ref 4.22–5.81)
RDW: 15.9 % — AB (ref 11.5–15.5)
WBC: 3.9 10*3/uL — ABNORMAL LOW (ref 4.0–10.5)

## 2014-05-18 LAB — LACTATE DEHYDROGENASE: LDH: 179 U/L (ref 94–250)

## 2014-05-18 LAB — COMPREHENSIVE METABOLIC PANEL
ALBUMIN: 3.6 g/dL (ref 3.5–5.2)
ALK PHOS: 89 U/L (ref 39–117)
ALT: 16 U/L (ref 0–53)
AST: 18 U/L (ref 0–37)
Anion gap: 11 (ref 5–15)
BILIRUBIN TOTAL: 0.5 mg/dL (ref 0.3–1.2)
BUN: 10 mg/dL (ref 6–23)
CHLORIDE: 106 meq/L (ref 96–112)
CO2: 25 mEq/L (ref 19–32)
Calcium: 8.9 mg/dL (ref 8.4–10.5)
Creatinine, Ser: 0.87 mg/dL (ref 0.50–1.35)
GFR calc Af Amer: >90 mL/min (ref >90)
GFR calc non Af Amer: 85 mL/min — ABNORMAL LOW (ref >90)
Glucose, Bld: 118 mg/dL — ABNORMAL HIGH (ref 70–99)
Potassium: 3.9 mEq/L (ref 3.7–5.3)
SODIUM: 142 meq/L (ref 137–147)
Total Protein: 6.9 g/dL (ref 6.0–8.3)

## 2014-05-18 MED ORDER — SODIUM CHLORIDE 0.9 % IV SOLN
Freq: Once | INTRAVENOUS | Status: AC
  Administered 2014-05-18: 10:00:00 via INTRAVENOUS

## 2014-05-18 MED ORDER — ACETAMINOPHEN 325 MG PO TABS
ORAL_TABLET | ORAL | Status: AC
  Filled 2014-05-18: qty 2

## 2014-05-18 MED ORDER — ACETAMINOPHEN 325 MG PO TABS
650.0000 mg | ORAL_TABLET | Freq: Four times a day (QID) | ORAL | Status: DC | PRN
  Administered 2014-05-18: 650 mg via ORAL

## 2014-05-18 MED ORDER — SODIUM CHLORIDE 0.9 % IJ SOLN
10.0000 mL | INTRAMUSCULAR | Status: DC | PRN
  Administered 2014-05-18: 10 mL

## 2014-05-18 MED ORDER — ZOLEDRONIC ACID 4 MG/5ML IV CONC
4.0000 mg | Freq: Once | INTRAVENOUS | Status: AC
  Administered 2014-05-18: 4 mg via INTRAVENOUS
  Filled 2014-05-18: qty 5

## 2014-05-18 MED ORDER — HEPARIN SOD (PORK) LOCK FLUSH 100 UNIT/ML IV SOLN
500.0000 [IU] | Freq: Once | INTRAVENOUS | Status: AC | PRN
  Administered 2014-05-18: 500 [IU]
  Filled 2014-05-18: qty 5

## 2014-05-18 MED ORDER — IMMUNE GLOBULIN (HUMAN) 20 GM/200ML IV SOLN
35.0000 g | Freq: Once | INTRAVENOUS | Status: AC
  Administered 2014-05-18: 35 g via INTRAVENOUS
  Filled 2014-05-18: qty 300

## 2014-05-18 MED ORDER — IMMUNE GLOBULIN (HUMAN) 10 GM/100ML IV SOLN: 0.4000 g/kg | Freq: Once | INTRAVENOUS | Status: DC

## 2014-05-18 NOTE — Patient Instructions (Signed)
..  [facility] Discharge Instructions for Patients Receiving Chemotherapy  Today you received the following agent IVIG.  Return as scheduled for follow-up.   The clinic phone number is [phone]. Office hours are Monday-Friday 8:30am-5:00pm.  BELOW ARE SYMPTOMS THAT SHOULD BE REPORTED IMMEDIATELY:  *FEVER GREATER THAN 101.0 F  *CHILLS WITH OR WITHOUT FEVER  NAUSEA AND VOMITING THAT IS NOT CONTROLLED WITH YOUR NAUSEA MEDICATION  *UNUSUAL SHORTNESS OF BREATH  *UNUSUAL BRUISING OR BLEEDING  TENDERNESS IN MOUTH AND THROAT WITH OR WITHOUT PRESENCE OF ULCERS  *URINARY PROBLEMS  *BOWEL PROBLEMS  UNUSUAL RASH Items with * indicate a potential emergency and should be followed up as soon as possible. If you have an emergency after office hours please contact your primary care physician or go to the nearest emergency department.  Please call the clinic during office hours if you have any questions or concerns.   You may also contact the Patient Navigator at [phone] should you have any questions or need assistance in obtaining follow up care. _____________________________________________________________________ Have you asked about our STAR program?    STAR stands for Survivorship Training and Rehabilitation, and this is a nationally recognized cancer care program that focuses on survivorship and rehabilitation.  Cancer and cancer treatments may cause problems, such as, pain, making you feel tired and keeping you from doing the things that you need or want to do. Cancer rehabilitation can help. Our goal is to reduce these troubling effects and help you have the best quality of life possible.  You may receive a survey from a nurse that asks questions about your current state of health.  Based on the survey results, all eligible patients will be referred to the [facility] program for an evaluation so we can better serve you! A frequently asked questions sheet is available  upon request.

## 2014-05-18 NOTE — Progress Notes (Signed)
[facility]  OFFICE PROGRESS NOTE  [name], MD [address] [place] [address]  DIAGNOSIS: MULTIPLE  MYELOMA - Plan: CBC with Differential, Comprehensive metabolic panel, Lactate dehydrogenase, Beta 2 microglobulin, serum, Immunofixation electrophoresis, Multiple myeloma panel, serum, Kappa/lambda light chains  Gastroenteritis  Chief Complaint  Patient presents with  . Multiple myeloma in remission  . 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  INTERVAL HISTORY: Jeremy Parrish 73 y.o. male returns for followup while taking maintenance therapy with Revlimid 10 mg daily for 7 days on and 7 days off after undergoing autologous peripheral blood stem cell transplant for IgG lambda multiple myeloma in October of 2011 at [facility] under the care of Dr. Marcell Anger. 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 In 2011 he was diagnosed with IgG lambda multiple myeloma and treated with 4 cycles of Velcade and dexamethasone followed by autologous peripheral blood stem cell transplant and currently remains on maintenance Revlimid and also monthly intravenous IgG plus Zometa 4 mg intravenously monthly. Over the past 2 days has loose bowel movements with one episode of vomiting. He denies any fever, melena, hematochezia, or anyone else at home sick. He had undergone right inguinal herniorrhaphy 2 weeks ago with good healing of the wound. He denies any cough, wheezing, sore throat, skin rash, joint pain, headache, or seizures.   MEDICAL HISTORY: Past Medical History  Diagnosis  Date  . Hypertension   . Heart disease   . Kidney stones     history  . Lung mass   . Heart murmur   . 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 Coronary artery disease   . Depression   . Shortness of breath   . Peripheral arterial disease   . Bladder neck contracture   . Personal history of other diseases of circulatory system   . Aortic aneurysm of unspecified site without mention of rupture   . Arthritis   . Intestinovesical fistula   . Esophageal reflux   . Hyperlipidemia   . Anemia   . CHF (congestive heart failure)   . COPD (chronic obstructive pulmonary disease)   . Cerebral atherosclerosis     Carotid Doppler, 02/16/2013 - Bilateral Proximal ICAs,demonstrate mild plaque w/o evidence of significant diameter reduction, dissection, or any other vascular abnormality  . Complication of anesthesia   . PONV (postoperative nausea and vomiting)   . Stroke 2013    Speech.  . Hx of bladder cancer 10/07/2012  . Sleep apnea     05-02-14 cpap , not yet used- suggested settings 5  . Cancer   . Prostate cancer 2000  . Multiple myeloma   . Non Hodgkin's lymphoma   . Myocardial infarction     '96    INTERIM HISTORY: has LYMPHOMA; MULTIPLE  MYELOMA; ANXIETY; HYPERTENSION; MYOCARDIAL INFARCTION; CAD, dating to 1996 with multiple PCIs, Stents to RCA and LCX, Last cath 2007 patent stents,30% prox LAD lesion, EF 45%; ASTHMA, UNSPECIFIED; NEPHROLITHIASIS; ELEVATED PROSTATE SPECIFIC ANTIGEN; Nonspecific (abnormal) findings on radiological  and other examination of body structure; ROTATOR CUFF REPAIR, RIGHT, HX OF; Hypogammaglobulinemia; Lt CVA with expressive aphasia Nov 2013; H/O cardiac pacemaker, Medtronic REVO, MRI conditional device, placed 07/2011 for sympyomatic bradycardia; Hx of bladder cancer; Hyperlipidemia LDL goal < 100; Prediabetes; Expressive aphasia; Hemiplegia affecting right dominant side; Aneurysm of iliac artery;  Shortness of breath; Rotator cuff tear arthropathy of right shoulder; Left rotator cuff tear arthropathy; Muscle weakness (generalized); Status post arthroscopy of shoulder; Pain in joint, shoulder region; Fever; Pneumonia; Pancytopenia; Chest pain with moderate risk for cardiac etiology; and Inguinal hernia on his problem list.    ALLERGIES:  is allergic to diphenhydramine hcl; morphine and related; and tape.  MEDICATIONS: has a current medication list which includes the following prescription(s): alprazolam, amlodipine, aspirin ec, vitamin d3, citalopram, clopidogrel, gabapentin, ibuprofen, isosorbide mononitrate, lenalidomide, nitroglycerin, omeprazole, oxycodone-acetaminophen, potassium chloride sa, simvastatin, temazepam, tramadol, and zoledronic acid, and the following Facility-Administered Medications: sodium chloride, sodium chloride, acetaminophen, heparin lock flush, sodium chloride, sodium chloride, and zolendronic acid (ZOMETA) 4 mg in sodium chloride 0.9 % 100 mL IVPB.  SURGICAL HISTORY:  Past Surgical History  Procedure Laterality Date  . Prostate surgery    . Heart stents x 5  1999  . Portacath placement  07/26/2009    right chest  . Wrist surgery      right  . Left ear skin cancer removed    . Bone marrow transplant  2011  . Pacemaker insertion  07/22/2011    Medtronic  . Coronary angioplasty  06/24/2000    PCI and stenting in mid & proximal RCA  . Insert / replace / remove pacemaker    . Tee without cardioversion  10/13/2012    Procedure: TRANSESOPHAGEAL ECHOCARDIOGRAM (TEE);  Surgeon: Sanda Klein, MD;  Location: [facility] ENDOSCOPY;  Service: Cardiovascular;  Laterality: N/A;  pat/kay/echo notified  . Colonoscopy N/A 01/01/2013    Procedure: COLONOSCOPY;  Surgeon: Rogene Houston, MD;  Location: [facility] ENDO SUITE;  Service: Endoscopy;  Laterality: N/A;  825-moved to [place] notified pt  . Rotator    . Rotator cuff repair Right   . Colon surgery      colon resection  . Bladder  surgery    . Shoulder arthroscopy with subacromial decompression Right 07/21/2013    Procedure: RIGHT SHOULDER ARTHROSCOPY WITH SUBACROMIAL DECOMPRESSION AND DEBRIDEMENT & Injection of Left Shoulder;  Surgeon: Alta Corning, MD;  Location: [place];  Service: Orthopedics;  Laterality: Right;  . US echocardiography  06/19/2011    RV mildly dilated,mild to mod. MR,mild AI,mild PI  . Nm myocar perf wall motion  11/27/2007    inferior scar  . Inguinal hernia repair Right 05/04/2014    Procedure: OPEN RIGHT INGUINAL HERNIA REPAIR with mesh;  Surgeon: Edward Jolly, MD;  Location: [facility] ORS;  Service: General;  Laterality: Right;    FAMILY HISTORY: family history includes Cancer in his brother and father; Heart attack in his sister; Heart disease in his brother, father, and sister; Hyperlipidemia in his sister; Hypertension in his mother and sister. There is no history of Colon cancer or Colon polyps.  SOCIAL HISTORY:  reports that he quit smoking about 19 years ago. His smoking use included Cigarettes. He has a 20 pack-year smoking history. He has never used smokeless tobacco. He reports that he does not drink alcohol or use illicit drugs.  REVIEW OF SYSTEMS:  Other than that discussed above is noncontributory.  PHYSICAL EXAMINATION: ECOG PERFORMANCE STATUS: 1 - Symptomatic but completely ambulatory  There were no vitals taken for this visit.  GENERAL:alert, no distress and comfortable SKIN: skin color, texture, turgor are normal, no rashes or significant lesions. Right partial pinnectomy. EYES: PERLA; Conjunctiva are pink and non-injected, sclera clear SINUSES: No redness or tenderness over maxillary or ethmoid sinuses OROPHARYNX:no exudate, no erythema on lips, buccal mucosa, or tongue. NECK: supple, thyroid normal size, non-tender, without nodularity. No masses CHEST: Increased AP diameter with no breast masses. LYMPH:  no palpable lymphadenopathy in the cervical, axillary or inguinal LUNGS:  clear to auscultation and percussion with normal breathing effort HEART: regular rate & rhythm and no murmurs. ABDOMEN:abdomen soft, non-tender and normal bowel sounds. Right lower quadrant inguinal incision well healed with subcutaneous induration. MUSCULOSKELETAL:no cyanosis of digits and no clubbing. Range of motion normal.  NEURO: alert & oriented x 3 with fluent speech, no focal motor/sensory deficits. Hearing deficit.   LABORATORY DATA: Appointment on 05/18/2014  Component Date Value Ref Range Status  . WBC 05/18/2014 3.9* 4.0 - 10.5 K/uL Final  . RBC 05/18/2014 4.22  4.22 - 5.81 MIL/uL Final  . Hemoglobin 05/18/2014 12.3* 13.0 - 17.0 g/dL Final  . HCT 28/97/9150 37.0* 39.0 - 52.0 % Final  . MCV 05/18/2014 87.7  78.0 - 100.0 fL Final  . MCH 05/18/2014 29.1  26.0 - 34.0 pg Final  . MCHC 05/18/2014 33.2  30.0 - 36.0 g/dL Final  . RDW 41/36/4383 15.9* 11.5 - 15.5 % Final  . Platelets 05/18/2014 131* 150 - 400 K/uL Final  . Neutrophils Relative % 05/18/2014 51  43 - 77 % Final  . Neutro Abs 05/18/2014 2.0  1.7 - 7.7 K/uL Final  . Lymphocytes Relative 05/18/2014 35  12 - 46 % Final  . Lymphs Abs 05/18/2014 1.4  0.7 - 4.0 K/uL Final  . Monocytes Relative 05/18/2014 7  3 - 12 % Final  . Monocytes Absolute 05/18/2014 0.3  0.1 - 1.0 K/uL Final  . Eosinophils Relative 05/18/2014 6* 0 - 5 % Final  . Eosinophils Absolute 05/18/2014 0.2  0.0 - 0.7 K/uL Final  . Basophils Relative 05/18/2014 1  0 - 1 % Final  . Basophils Absolute 05/18/2014 0.0  0.0 - 0.1 K/uL Final  . Sodium 05/18/2014 142  137 - 147 mEq/L Final  . Potassium 05/18/2014 3.9  3.7 - 5.3 mEq/L Final  . Chloride 05/18/2014 106  96 - 112 mEq/L Final  . CO2 05/18/2014 25  19 - 32 mEq/L Final  . Glucose, Bld 05/18/2014 118* 70 - 99 mg/dL Final  . BUN 77/93/9688 10  6 - 23 mg/dL Final  . Creatinine, Ser 05/18/2014 0.87  0.50 - 1.35 mg/dL Final  . Calcium 64/84/7207 8.9  8.4 - 10.5 mg/dL Final  . Total Protein 05/18/2014 6.9  6.0  - 8.3 g/dL Final  . Albumin 21/82/8833 3.6  3.5 - 5.2 g/dL Final  . AST 74/45/1460 18  0 - 37 U/L Final  . ALT 05/18/2014 16  0 - 53 U/L Final  . Alkaline Phosphatase 05/18/2014 89  39 - 117 U/L Final  . Total Bilirubin 05/18/2014 0.5  0.3 - 1.2 mg/dL Final  . GFR calc non Af Amer 05/18/2014 85* >90 mL/min Final  . GFR calc Af Amer 05/18/2014 >90  >90 mL/min Final   Comment: (NOTE)                          The eGFR has been calculated using the CKD EPI equation.  This calculation has not been validated in all clinical situations.                          eGFR's persistently <90 mL/min signify possible Chronic Kidney                          Disease.  . Anion gap 05/18/2014 11  5 - 15 Final  . LDH 05/18/2014 179  94 - 250 U/L Final  . Total Protein 05/18/2014 PENDING  6.0 - 8.3 g/dL Incomplete  . Albumin ELP 05/18/2014 PENDING  55.8 - 66.1 % Incomplete  . Alpha-1-Globulin 05/18/2014 PENDING  2.9 - 4.9 % Incomplete  . Alpha-2-Globulin 05/18/2014 PENDING  7.1 - 11.8 % Incomplete  . Beta Globulin 05/18/2014 PENDING  4.7 - 7.2 % Incomplete  . Beta 2 05/18/2014 PENDING  3.2 - 6.5 % Incomplete  . Gamma Globulin 05/18/2014 PENDING  11.1 - 18.8 % Incomplete  . M-Spike, % 05/18/2014 PENDING   Incomplete  . SPE Interp. 05/18/2014 PENDING   Incomplete  . Comment 05/18/2014 PENDING   Incomplete  . IgG (Immunoglobin G), Serum 05/18/2014 PENDING  694 - 1618 mg/dL Incomplete  . IgA 05/18/2014 PENDING  68 - 378 mg/dL Incomplete  . IgM, Serum 05/18/2014 PENDING  60 - 263 mg/dL Incomplete  . Immunofix Electr Int 05/18/2014 PENDING   Incomplete  Infusion on 04/20/2014  Component Date Value Ref Range Status  . WBC 04/20/2014 5.3  4.0 - 10.5 K/uL Final  . RBC 04/20/2014 4.43  4.22 - 5.81 MIL/uL Final  . Hemoglobin 04/20/2014 13.0  13.0 - 17.0 g/dL Final  . HCT 04/20/2014 39.2  39.0 - 52.0 % Final  . MCV 04/20/2014 88.5  78.0 - 100.0 fL Final  . MCH 04/20/2014 29.3  26.0 - 34.0  pg Final  . MCHC 04/20/2014 33.2  30.0 - 36.0 g/dL Final  . RDW 04/20/2014 16.2* 11.5 - 15.5 % Final  . Platelets 04/20/2014 138* 150 - 400 K/uL Final  . Neutrophils Relative % 04/20/2014 58  43 - 77 % Final  . Neutro Abs 04/20/2014 3.1  1.7 - 7.7 K/uL Final  . Lymphocytes Relative 04/20/2014 26  12 - 46 % Final  . Lymphs Abs 04/20/2014 1.4  0.7 - 4.0 K/uL Final  . Monocytes Relative 04/20/2014 12  3 - 12 % Final  . Monocytes Absolute 04/20/2014 0.6  0.1 - 1.0 K/uL Final  . Eosinophils Relative 04/20/2014 3  0 - 5 % Final  . Eosinophils Absolute 04/20/2014 0.2  0.0 - 0.7 K/uL Final  . Basophils Relative 04/20/2014 1  0 - 1 % Final  . Basophils Absolute 04/20/2014 0.1  0.0 - 0.1 K/uL Final  . Sodium 04/20/2014 141  137 - 147 mEq/L Final  . Potassium 04/20/2014 4.0  3.7 - 5.3 mEq/L Final  . Chloride 04/20/2014 102  96 - 112 mEq/L Final  . CO2 04/20/2014 25  19 - 32 mEq/L Final  . Glucose, Bld 04/20/2014 138* 70 - 99 mg/dL Final  . BUN 04/20/2014 13  6 - 23 mg/dL Final  . Creatinine, Ser 04/20/2014 0.96  0.50 - 1.35 mg/dL Final  . Calcium 04/20/2014 9.4  8.4 - 10.5 mg/dL Final  . Total Protein 04/20/2014 6.9  6.0 - 8.3 g/dL Final  . Albumin 04/20/2014 3.7  3.5 - 5.2 g/dL Final  . AST 04/20/2014 21  0 - 37 U/L Final  .  ALT 04/20/2014 17  0 - 53 U/L Final  . Alkaline Phosphatase 04/20/2014 77  39 - 117 U/L Final  . Total Bilirubin 04/20/2014 0.4  0.3 - 1.2 mg/dL Final  . GFR calc non Af Amer 04/20/2014 81* >90 mL/min Final  . GFR calc Af Amer 04/20/2014 >90  >90 mL/min Final   Comment: (NOTE)                          The eGFR has been calculated using the CKD EPI equation.                          This calculation has not been validated in all clinical situations.                          eGFR's persistently <90 mL/min signify possible Chronic Kidney                          Disease.  Marland Kitchen LDH 04/20/2014 170  94 - 250 U/L Final  . Beta-2 Microglobulin 04/20/2014 3.37* <=2.51 mg/L Final    Performed at [facility]  . CRP 04/20/2014 <0.5* <0.60 mg/dL Final   Performed at [facility]  . Total Protein 04/20/2014 6.6  6.0 - 8.3 g/dL Final  . Albumin ELP 04/20/2014 58.0  55.8 - 66.1 % Final  . Alpha-1-Globulin 04/20/2014 5.2* 2.9 - 4.9 % Final  . Alpha-2-Globulin 04/20/2014 11.6  7.1 - 11.8 % Final  . Beta Globulin 04/20/2014 7.3* 4.7 - 7.2 % Final  . Beta 2 04/20/2014 5.1  3.2 - 6.5 % Final  . Gamma Globulin 04/20/2014 12.8  11.1 - 18.8 % Final  . M-Spike, % 04/20/2014 NOT DETECTED   Final  . SPE Interp. 04/20/2014 (NOTE)   Final   Comment: The possibility of a faint restricted band(s) cannot be completely                          excluded in the gamma region.                          Results are consistent with SPE performed on 03/24/2014  Reviewed by                          Odis Hollingshead, MD, PhD, FCAP (Electronic Signature on File)  . Comment 04/20/2014 (NOTE)   Final   Comment: ---------------                          Serum protein electrophoresis is a useful screening procedure in the                          detection of various pathophysiologic states such as inflammation,                          gammopathies, protein loss and other dysproteinemias.  Immunofixation                          electrophoresis (IFE) is a more sensitive technique for the  identification of M-proteins found in patients with monoclonal                          gammopathy of unknown significance (MGUS), amyloidosis, early or                          treated myeloma or macroglobulinemia, solitary plasmacytoma or                          extramedullary plasmacytoma.  . IgG (Immunoglobin G), Serum 04/20/2014 864  650 - 1600 mg/dL Final  . IgA 04/20/2014 188  68 - 379 mg/dL Final  . IgM, Serum 04/20/2014 20* 41 - 251 mg/dL Final  . Immunofix Electr Int 04/20/2014 (NOTE)   Final   Comment: Area of slightly restricted mobility in the IgG and Kappa  lanes.                          Suggest repeat in 6-8 months, if clinically indicated.                          Reviewed by Odis Hollingshead, MD, PhD, FCAP (Electronic Signature on                          File)                          Performed at [facility]  . Kappa free light chain 04/20/2014 1.30  0.33 - 1.94 mg/dL Final  . Lamda free light chains 04/20/2014 1.57  0.57 - 2.63 mg/dL Final  . Kappa, lamda light chain ratio 04/20/2014 0.83  0.26 - 1.65 Final   Performed at [facility]  . Iron 04/20/2014 99  42 - 135 ug/dL Final  . TIBC 04/20/2014 365  215 - 435 ug/dL Final  . Saturation Ratios 04/20/2014 27  20 - 55 % Final  . UIBC 04/20/2014 266  125 - 400 ug/dL Final   Performed at [facility]  . Ferritin 04/20/2014 54  22 - 322 ng/mL Final   Performed at [facility]: No new pathology.  Urinalysis No results found for this basename: colorurine,  appearanceur,  labspec,  phurine,  glucoseu,  hgbur,  bilirubinur,  ketonesur,  proteinur,  urobilinogen,  nitrite,  leukocytesur    RADIOGRAPHIC STUDIES: No results found.  ASSESSMENT:  #1.IgG lambda multiple myeloma status post 4 cycles of dexamethasone and Velcade followed by bone marrow transplantation by Dr. Marcell Anger at [facility] October 2011. Now on maintenance Revlimid which he is tolerating well, no evidence of disease..  2. Non-Hodgkin's lymphoma, stage IVb, low-grade B-cell type, symptomatic at presentation in July 2002 to 6 cycles of R. CHOP with a complete response and PET scan in June 2012 showed no evidence for recurrent disease .  #3. 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 Left middle cerebral artery thrombosis, manifesting expressive dysphagia and minimal right hemiparesis.  #5. Coronary artery disease, status post stent placement x5 in the past, status post pacemaker for heart block.  #6. Chronic obstructive pulmonary disease, no longer smoking.  #7. Osteoporosis,  on treatment with Zometa, calcium, and vitamin D.  #8. Cancer involving the right ear, status  post excision, no evidence of recurrence, surgery by Dr. Link Snuffer.  #9. Episode of cold vesicular fistula, status post repair with no evidence of recurrence.  #10. History of bladder cancer, status post TURBT.  #11. Probable viral gastroenteritis, improved.        PLAN:  #1. IV IgG 35 g intravenously and continue monthly. #2. Zometa 4 mg intravenously and continue monthly. #3. Continue Revlimid 10 mg daily for 7 days on and 7 days off, currently in a treatment week. #4. Office visit 3 months with myeloma panel. #5. Followup at [facility] with Dr. Marcell Anger in October 2015   All questions were answered. The patient knows to call the clinic with any problems, questions or concerns. We can certainly see the patient much sooner if necessary.   I spent 25 minutes counseling the patient face to face. The total time spent in the appointment was 30 minutes.    Doroteo Bradford, MD 05/18/2014 9:37 AM  DISCLAIMER:  This note was dictated with voice recognition software.  Similar sounding words can inadvertently be transcribed inaccurately and may not be corrected upon review.

## 2014-05-18 NOTE — Progress Notes (Signed)
LABS DRAWN FOR CBCD,CMP,LDH,B2M,SIFX,MM,KLLC,

## 2014-05-18 NOTE — Telephone Encounter (Signed)
Spoke with patient informing him that Dr. Claiborne Billings wasn't previously aware that he had the sleep study since it was done @ [facility].  A message has been sent to him to go into the patient's chart and review and advise. Patient informed that once Dr. Claiborne Billings has given orders the DME company will contact him for set up of his therapy. Patient voiced understanding and will wait for a return call from [place] or DME company.

## 2014-05-19 DIAGNOSIS — C44621 Squamous cell carcinoma of skin of unspecified upper limb, including shoulder: Secondary | ICD-10-CM | POA: Diagnosis not present

## 2014-05-19 DIAGNOSIS — L570 Actinic keratosis: Secondary | ICD-10-CM | POA: Diagnosis not present

## 2014-05-19 LAB — KAPPA/LAMBDA LIGHT CHAINS
Kappa free light chain: 2.12 mg/dL — ABNORMAL HIGH (ref 0.33–1.94)
Kappa, lambda light chain ratio: 1.2 (ref 0.26–1.65)
LAMDA FREE LIGHT CHAINS: 1.77 mg/dL (ref 0.57–2.63)

## 2014-05-20 ENCOUNTER — Ambulatory Visit (HOSPITAL_COMMUNITY): Payer: Medicare Other

## 2014-05-20 LAB — MULTIPLE MYELOMA PANEL, SERUM
Albumin ELP: 56.6 % (ref 55.8–66.1)
Alpha-1-Globulin: 7.9 % — ABNORMAL HIGH (ref 2.9–4.9)
Alpha-2-Globulin: 12.2 % — ABNORMAL HIGH (ref 7.1–11.8)
Beta 2: 4.1 % (ref 3.2–6.5)
Beta Globulin: 7.3 % — ABNORMAL HIGH (ref 4.7–7.2)
GAMMA GLOBULIN: 11.9 % (ref 11.1–18.8)
IGG (IMMUNOGLOBIN G), SERUM: 794 mg/dL (ref 650–1600)
IgA: 221 mg/dL (ref 68–379)
IgM, Serum: 20 mg/dL — ABNORMAL LOW (ref 41–251)
M-SPIKE, %: NOT DETECTED g/dL
Total Protein: 6.5 g/dL (ref 6.0–8.3)

## 2014-05-20 LAB — IMMUNOFIXATION ELECTROPHORESIS
IGA: 222 mg/dL (ref 68–379)
IGG (IMMUNOGLOBIN G), SERUM: 815 mg/dL (ref 650–1600)
IgM, Serum: 18 mg/dL — ABNORMAL LOW (ref 41–251)
Total Protein ELP: 6.4 g/dL (ref 6.0–8.3)

## 2014-05-20 LAB — BETA 2 MICROGLOBULIN, SERUM: Beta-2 Microglobulin: 2.66 mg/L — ABNORMAL HIGH (ref <=2.51)

## 2014-05-23 ENCOUNTER — Other Ambulatory Visit (HOSPITAL_COMMUNITY): Payer: Self-pay | Admitting: Oncology

## 2014-05-23 DIAGNOSIS — E876 Hypokalemia: Secondary | ICD-10-CM

## 2014-05-23 MED ORDER — POTASSIUM CHLORIDE CRYS ER 20 MEQ PO TBCR: 20.0000 meq | EXTENDED_RELEASE_TABLET | Freq: Three times a day (TID) | ORAL | Status: DC

## 2014-05-24 ENCOUNTER — Telehealth (INDEPENDENT_AMBULATORY_CARE_PROVIDER_SITE_OTHER): Payer: Self-pay

## 2014-05-24 NOTE — Telephone Encounter (Signed)
Pt s/p right inguinal hernia repair on 05/04/14. Pt was calling today to let Dr Excell Seltzer know that his incision at times is tender to touch. Pt denies any redness, swelling, fevers or chills. Pt has a f/u appt for 7/22. Advised pt to keep a eye on the incision for increased pain,redness or swelling. Advised pt that if this occurs to give our office a call back, otherwise Dr Excell Seltzer will look at the incision when he comes in for his office visit. Pt verbalized understanding and agrees with POC.

## 2014-05-25 ENCOUNTER — Ambulatory Visit (HOSPITAL_COMMUNITY): Payer: Medicare Other

## 2014-06-01 ENCOUNTER — Encounter (INDEPENDENT_AMBULATORY_CARE_PROVIDER_SITE_OTHER): Payer: Self-pay | Admitting: General Surgery

## 2014-06-01 ENCOUNTER — Ambulatory Visit (INDEPENDENT_AMBULATORY_CARE_PROVIDER_SITE_OTHER): Payer: Medicare Other | Admitting: General Surgery

## 2014-06-01 VITALS — BP 136/74 | HR 66 | Temp 97.0°F | Ht 66.0 in | Wt 193.0 lb

## 2014-06-01 DIAGNOSIS — Z09 Encounter for follow-up examination after completed treatment for conditions other than malignant neoplasm: Secondary | ICD-10-CM

## 2014-06-01 MED ORDER — OXYCODONE-ACETAMINOPHEN 5-325 MG PO TABS: 1.0000 | ORAL_TABLET | ORAL | Status: DC | PRN

## 2014-06-01 NOTE — Progress Notes (Signed)
History: Patient returns for routine followup approximately 3 weeks following open right inguinal hernia repair. He is complaining of a lot of soreness and tenderness at the incision. Particularly tenderness to touch.  Exam: BP 136/74  Pulse 66  Temp(Src) 97 F (36.1 C)  Ht 5\' 6"  (1.676 m)  Wt 193 lb (87.544 kg)  BMI 31.17 kg/m2 General: Appears wel Incision: Is nicely healed without swelling or erythema. The repair feels solid.  Assessment and plan: 3 weeks following right inguinal hernia repair. I do not see any evidence of complication. I think he just has typical postoperative discomfort that should resolve over the next few weeks. I refilled his pain medication. I asked him to call for return appointment in 4 weeks if he is not feeling significantly better. l

## 2014-06-06 ENCOUNTER — Telehealth: Payer: Self-pay | Admitting: Cardiovascular Disease

## 2014-06-06 NOTE — Telephone Encounter (Signed)
Please call, need to talk to somebody about getting his sleep machine,he have had all the tests.

## 2014-06-07 ENCOUNTER — Other Ambulatory Visit: Payer: Self-pay

## 2014-06-07 ENCOUNTER — Telehealth: Payer: Self-pay | Admitting: Cardiovascular Disease

## 2014-06-07 DIAGNOSIS — C9000 Multiple myeloma not having achieved remission: Secondary | ICD-10-CM

## 2014-06-07 MED ORDER — AMLODIPINE BESYLATE 10 MG PO TABS: 10.0000 mg | ORAL_TABLET | Freq: Every morning | ORAL | Status: DC

## 2014-06-07 NOTE — Telephone Encounter (Signed)
Rx was sent to pharmacy electronically. 

## 2014-06-07 NOTE — Telephone Encounter (Signed)
Patient states that he still has not received his CPAP machine.

## 2014-06-08 ENCOUNTER — Ambulatory Visit: Payer: Medicare Other | Admitting: Neurology

## 2014-06-08 NOTE — Telephone Encounter (Signed)
Called patient to inform him that I done his sleep referral to [facility]. They will be contacting him to get set up on his therapy. Apologized for this taking so long to get done.

## 2014-06-08 NOTE — Telephone Encounter (Signed)
Referral done to [facility] to have patient set up for OSA treatment. Patient notified.

## 2014-06-14 ENCOUNTER — Telehealth: Payer: Self-pay | Admitting: *Deleted

## 2014-06-14 DIAGNOSIS — M19019 Primary osteoarthritis, unspecified shoulder: Secondary | ICD-10-CM | POA: Diagnosis not present

## 2014-06-14 DIAGNOSIS — M25539 Pain in unspecified wrist: Secondary | ICD-10-CM | POA: Diagnosis not present

## 2014-06-14 NOTE — Telephone Encounter (Signed)
Anderson Malta called to say that she has been trying to set patient up on his CPAP therapy. He waited 10 min before his appointment to inform her that he needed to reschedule.  He has appointment in [place].She informs me that she was almost to his house in [place] when she received the call so she proceeded to go to his house. Once arrived he was very rude to her saying that she was late. She informs him that she told him she would be there between 11-12 noon. He then asked her how long the set up would take. She told him approx 16min. Patient states he could not stay for her to set him up. She offered to set the patient up the next day. Again he declined stating that he could not do it. Anderson Malta will try to work around the patient's schedule and get him set up on Thursday or Friday.

## 2014-06-16 ENCOUNTER — Encounter (HOSPITAL_COMMUNITY): Payer: Medicare Other | Attending: Oncology

## 2014-06-16 ENCOUNTER — Other Ambulatory Visit (HOSPITAL_COMMUNITY): Payer: Self-pay | Admitting: Oncology

## 2014-06-16 ENCOUNTER — Telehealth: Payer: Self-pay | Admitting: *Deleted

## 2014-06-16 ENCOUNTER — Encounter (HOSPITAL_COMMUNITY): Payer: Medicare Other

## 2014-06-16 VITALS — BP 128/64 | HR 61 | Temp 97.5°F | Resp 20 | Wt 192.0 lb

## 2014-06-16 DIAGNOSIS — C9000 Multiple myeloma not having achieved remission: Secondary | ICD-10-CM | POA: Diagnosis not present

## 2014-06-16 DIAGNOSIS — I6350 Cerebral infarction due to unspecified occlusion or stenosis of unspecified cerebral artery: Secondary | ICD-10-CM | POA: Diagnosis not present

## 2014-06-16 DIAGNOSIS — C8589 Other specified types of non-Hodgkin lymphoma, extranodal and solid organ sites: Secondary | ICD-10-CM | POA: Diagnosis not present

## 2014-06-16 DIAGNOSIS — M810 Age-related osteoporosis without current pathological fracture: Secondary | ICD-10-CM

## 2014-06-16 DIAGNOSIS — D801 Nonfamilial hypogammaglobulinemia: Secondary | ICD-10-CM

## 2014-06-16 LAB — COMPREHENSIVE METABOLIC PANEL
ALK PHOS: 69 U/L (ref 39–117)
ALT: 17 U/L (ref 0–53)
ANION GAP: 11 (ref 5–15)
AST: 19 U/L (ref 0–37)
Albumin: 3.7 g/dL (ref 3.5–5.2)
BUN: 20 mg/dL (ref 6–23)
CHLORIDE: 105 meq/L (ref 96–112)
CO2: 24 mEq/L (ref 19–32)
Calcium: 8.7 mg/dL (ref 8.4–10.5)
Creatinine, Ser: 0.88 mg/dL (ref 0.50–1.35)
GFR calc non Af Amer: 84 mL/min — ABNORMAL LOW (ref >90)
Glucose, Bld: 100 mg/dL — ABNORMAL HIGH (ref 70–99)
Potassium: 4.2 mEq/L (ref 3.7–5.3)
SODIUM: 140 meq/L (ref 137–147)
TOTAL PROTEIN: 6.6 g/dL (ref 6.0–8.3)
Total Bilirubin: 0.3 mg/dL (ref 0.3–1.2)

## 2014-06-16 LAB — CBC WITH DIFFERENTIAL/PLATELET
Basophils Absolute: 0 10*3/uL (ref 0.0–0.1)
Basophils Relative: 0 % (ref 0–1)
EOS ABS: 0 10*3/uL (ref 0.0–0.7)
Eosinophils Relative: 0 % (ref 0–5)
HCT: 35.2 % — ABNORMAL LOW (ref 39.0–52.0)
HEMOGLOBIN: 11.8 g/dL — AB (ref 13.0–17.0)
LYMPHS ABS: 1.2 10*3/uL (ref 0.7–4.0)
Lymphocytes Relative: 18 % (ref 12–46)
MCH: 29.6 pg (ref 26.0–34.0)
MCHC: 33.5 g/dL (ref 30.0–36.0)
MCV: 88.4 fL (ref 78.0–100.0)
MONOS PCT: 17 % — AB (ref 3–12)
Monocytes Absolute: 1.1 10*3/uL — ABNORMAL HIGH (ref 0.1–1.0)
NEUTROS PCT: 65 % (ref 43–77)
Neutro Abs: 4.1 10*3/uL (ref 1.7–7.7)
Platelets: 144 10*3/uL — ABNORMAL LOW (ref 150–400)
RBC: 3.98 MIL/uL — AB (ref 4.22–5.81)
RDW: 15.8 % — ABNORMAL HIGH (ref 11.5–15.5)
WBC: 6.3 10*3/uL (ref 4.0–10.5)

## 2014-06-16 LAB — LACTATE DEHYDROGENASE: LDH: 168 U/L (ref 94–250)

## 2014-06-16 MED ORDER — LENALIDOMIDE 10 MG PO CAPS: 10.0000 mg | ORAL_CAPSULE | Freq: Every day | ORAL | Status: DC

## 2014-06-16 MED ORDER — HEPARIN SOD (PORK) LOCK FLUSH 100 UNIT/ML IV SOLN
500.0000 [IU] | Freq: Once | INTRAVENOUS | Status: AC | PRN
  Administered 2014-06-16: 500 [IU]
  Filled 2014-06-16: qty 5

## 2014-06-16 MED ORDER — ACETAMINOPHEN 325 MG PO TABS: 650.0000 mg | ORAL_TABLET | Freq: Four times a day (QID) | ORAL | Status: DC | PRN

## 2014-06-16 MED ORDER — SODIUM CHLORIDE 0.9 % IV SOLN: Freq: Once | INTRAVENOUS | Status: DC

## 2014-06-16 MED ORDER — IMMUNE GLOBULIN (HUMAN) 20 GM/200ML IV SOLN
35.0000 g | Freq: Once | INTRAVENOUS | Status: AC
  Administered 2014-06-16: 35 g via INTRAVENOUS
  Filled 2014-06-16: qty 200

## 2014-06-16 MED ORDER — DEXTROSE 5 % IV SOLN
INTRAVENOUS | Status: DC
  Administered 2014-06-16: 09:00:00 via INTRAVENOUS

## 2014-06-16 MED ORDER — SODIUM CHLORIDE 0.9 % IV SOLN
4.0000 mg | Freq: Once | INTRAVENOUS | Status: AC
  Administered 2014-06-16: 4 mg via INTRAVENOUS
  Filled 2014-06-16: qty 5

## 2014-06-16 MED ORDER — SODIUM CHLORIDE 0.9 % IV SOLN
Freq: Once | INTRAVENOUS | Status: AC
  Administered 2014-06-16: 12:00:00 via INTRAVENOUS

## 2014-06-16 MED ORDER — SODIUM CHLORIDE 0.9 % IJ SOLN
10.0000 mL | INTRAMUSCULAR | Status: DC | PRN
  Administered 2014-06-16: 10 mL

## 2014-06-16 NOTE — Progress Notes (Signed)
Tolerated well

## 2014-06-16 NOTE — Progress Notes (Signed)
LABS DRAWN FOR CBCD,CMP,LDH  

## 2014-06-16 NOTE — Telephone Encounter (Signed)
Faxed CPAP set-up and supply order to [facility]

## 2014-06-24 ENCOUNTER — Other Ambulatory Visit: Payer: Self-pay | Admitting: Family Medicine

## 2014-06-24 ENCOUNTER — Encounter: Payer: Self-pay | Admitting: Cardiovascular Disease

## 2014-07-12 ENCOUNTER — Encounter: Payer: Self-pay | Admitting: Family Medicine

## 2014-07-12 ENCOUNTER — Ambulatory Visit (INDEPENDENT_AMBULATORY_CARE_PROVIDER_SITE_OTHER): Payer: Medicare Other | Admitting: Family Medicine

## 2014-07-12 VITALS — BP 130/88 | Ht 66.0 in | Wt 190.0 lb

## 2014-07-12 DIAGNOSIS — I2510 Atherosclerotic heart disease of native coronary artery without angina pectoris: Secondary | ICD-10-CM | POA: Diagnosis not present

## 2014-07-12 DIAGNOSIS — M19019 Primary osteoarthritis, unspecified shoulder: Secondary | ICD-10-CM | POA: Diagnosis not present

## 2014-07-12 DIAGNOSIS — J329 Chronic sinusitis, unspecified: Secondary | ICD-10-CM | POA: Diagnosis not present

## 2014-07-12 MED ORDER — CEFTRIAXONE SODIUM 1 G IJ SOLR
500.0000 mg | Freq: Once | INTRAMUSCULAR | Status: AC
  Administered 2014-07-12: 500 mg via INTRAMUSCULAR

## 2014-07-12 MED ORDER — CEFDINIR 300 MG PO CAPS: 300.0000 mg | ORAL_CAPSULE | Freq: Two times a day (BID) | ORAL | Status: DC

## 2014-07-12 NOTE — Progress Notes (Signed)
   Subjective:    Patient ID: Jeremy Parrish, male    DOB: Aug 03, 1941, 73 y.o.   MRN: [id number]  HPI Patient here today with what seems like sinus problems. Patient concerns: says he feel very sluggish & not having any energy.  No high fevers. Cough productive at times.  Some frontal headache.  No wheezing. No shortness of breath. Still good appetite.  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 Review of Systems No headache no chest pain no back pain no abdominal pain no change in bowel habits ROS otherwise negative    Objective:   Physical Exam  Alert no apparent distress. HEENT moderate nasal congestion. Lungs no crackles no wheezes intermittent bronchial cough heart rare in rhythm.      Assessment & Plan:  Impression rhinosinusitis/bronchitis plan antibiotics prescribed. Injection given. Warning signs discussed. WSL

## 2014-07-14 ENCOUNTER — Other Ambulatory Visit (HOSPITAL_COMMUNITY): Payer: Self-pay | Admitting: *Deleted

## 2014-07-14 ENCOUNTER — Telehealth (HOSPITAL_COMMUNITY): Payer: Self-pay | Admitting: *Deleted

## 2014-07-14 NOTE — Telephone Encounter (Signed)
Patient's son called reporting that Jeremy Parrish has had fever, chest tightness and coughing up green phlegm. He was seen by his primary care MD and he started him on antibiotic on 9/1. Jeremy Parrish is scheduled for IVIG 9/8 and all labs again in October. Their questions are 1. Should he try to receive the IVIG Friday(if available) and 2. Does he need his other labs checked again before October.

## 2014-07-15 ENCOUNTER — Encounter (HOSPITAL_BASED_OUTPATIENT_CLINIC_OR_DEPARTMENT_OTHER): Payer: Medicare Other

## 2014-07-15 ENCOUNTER — Encounter (HOSPITAL_COMMUNITY): Payer: Medicare Other | Attending: Oncology

## 2014-07-15 VITALS — BP 130/72 | HR 63 | Temp 97.6°F | Resp 18 | Wt 190.0 lb

## 2014-07-15 DIAGNOSIS — Z9484 Stem cells transplant status: Secondary | ICD-10-CM

## 2014-07-15 DIAGNOSIS — I6350 Cerebral infarction due to unspecified occlusion or stenosis of unspecified cerebral artery: Secondary | ICD-10-CM | POA: Insufficient documentation

## 2014-07-15 DIAGNOSIS — C9000 Multiple myeloma not having achieved remission: Secondary | ICD-10-CM | POA: Insufficient documentation

## 2014-07-15 DIAGNOSIS — M810 Age-related osteoporosis without current pathological fracture: Secondary | ICD-10-CM | POA: Diagnosis not present

## 2014-07-15 DIAGNOSIS — C8589 Other specified types of non-Hodgkin lymphoma, extranodal and solid organ sites: Secondary | ICD-10-CM

## 2014-07-15 DIAGNOSIS — D801 Nonfamilial hypogammaglobulinemia: Secondary | ICD-10-CM | POA: Diagnosis not present

## 2014-07-15 LAB — CBC WITH DIFFERENTIAL/PLATELET
Basophils Absolute: 0.1 10*3/uL (ref 0.0–0.1)
Basophils Relative: 2 % — ABNORMAL HIGH (ref 0–1)
EOS PCT: 9 % — AB (ref 0–5)
Eosinophils Absolute: 0.4 10*3/uL (ref 0.0–0.7)
HCT: 34.7 % — ABNORMAL LOW (ref 39.0–52.0)
HEMOGLOBIN: 11.5 g/dL — AB (ref 13.0–17.0)
LYMPHS ABS: 1 10*3/uL (ref 0.7–4.0)
LYMPHS PCT: 25 % (ref 12–46)
MCH: 29.5 pg (ref 26.0–34.0)
MCHC: 33.1 g/dL (ref 30.0–36.0)
MCV: 89 fL (ref 78.0–100.0)
MONO ABS: 0.6 10*3/uL (ref 0.1–1.0)
MONOS PCT: 16 % — AB (ref 3–12)
NEUTROS ABS: 1.9 10*3/uL (ref 1.7–7.7)
Neutrophils Relative %: 49 % (ref 43–77)
Platelets: 104 10*3/uL — ABNORMAL LOW (ref 150–400)
RBC: 3.9 MIL/uL — AB (ref 4.22–5.81)
RDW: 15.4 % (ref 11.5–15.5)
WBC: 3.9 10*3/uL — AB (ref 4.0–10.5)

## 2014-07-15 LAB — COMPREHENSIVE METABOLIC PANEL
ALT: 19 U/L (ref 0–53)
AST: 24 U/L (ref 0–37)
Albumin: 3.5 g/dL (ref 3.5–5.2)
Alkaline Phosphatase: 70 U/L (ref 39–117)
Anion gap: 12 (ref 5–15)
BUN: 16 mg/dL (ref 6–23)
CHLORIDE: 106 meq/L (ref 96–112)
CO2: 24 meq/L (ref 19–32)
CREATININE: 0.79 mg/dL (ref 0.50–1.35)
Calcium: 8.9 mg/dL (ref 8.4–10.5)
GFR, EST NON AFRICAN AMERICAN: 88 mL/min — AB (ref >90)
GLUCOSE: 122 mg/dL — AB (ref 70–99)
Potassium: 3.9 mEq/L (ref 3.7–5.3)
Sodium: 142 mEq/L (ref 137–147)
Total Bilirubin: 0.3 mg/dL (ref 0.3–1.2)
Total Protein: 6.3 g/dL (ref 6.0–8.3)

## 2014-07-15 LAB — LACTATE DEHYDROGENASE: LDH: 158 U/L (ref 94–250)

## 2014-07-15 MED ORDER — ZOLEDRONIC ACID 4 MG/5ML IV CONC
4.0000 mg | Freq: Once | INTRAVENOUS | Status: AC
  Administered 2014-07-15: 4 mg via INTRAVENOUS
  Filled 2014-07-15: qty 5

## 2014-07-15 MED ORDER — SODIUM CHLORIDE 0.9 % IV SOLN: Freq: Once | INTRAVENOUS | Status: DC

## 2014-07-15 MED ORDER — ACETAMINOPHEN 325 MG PO TABS
650.0000 mg | ORAL_TABLET | Freq: Four times a day (QID) | ORAL | Status: DC | PRN
  Administered 2014-07-15: 650 mg via ORAL
  Filled 2014-07-15: qty 2

## 2014-07-15 MED ORDER — HEPARIN SOD (PORK) LOCK FLUSH 100 UNIT/ML IV SOLN
500.0000 [IU] | Freq: Once | INTRAVENOUS | Status: AC | PRN
  Administered 2014-07-15: 500 [IU]
  Filled 2014-07-15: qty 5

## 2014-07-15 MED ORDER — SODIUM CHLORIDE 0.9 % IJ SOLN
10.0000 mL | INTRAMUSCULAR | Status: DC | PRN
  Administered 2014-07-15: 10 mL

## 2014-07-15 MED ORDER — IMMUNE GLOBULIN (HUMAN) 20 GM/200ML IV SOLN
35.0000 g | Freq: Once | INTRAVENOUS | Status: AC
  Administered 2014-07-15: 35 g via INTRAVENOUS
  Filled 2014-07-15: qty 200

## 2014-07-15 NOTE — Progress Notes (Signed)
Jeremy Parrish presented for labwork. Labs per MD order drawn via Peripheral Line 21 gauge needle inserted in rt ac.  Good blood return present. Procedure without incident.  Needle removed intact. Patient tolerated procedure well.

## 2014-07-19 ENCOUNTER — Other Ambulatory Visit (HOSPITAL_COMMUNITY): Payer: Medicare Other

## 2014-07-19 ENCOUNTER — Ambulatory Visit (HOSPITAL_COMMUNITY): Payer: Medicare Other

## 2014-07-19 ENCOUNTER — Telehealth: Payer: Self-pay | Admitting: Family Medicine

## 2014-07-19 LAB — BETA 2 MICROGLOBULIN, SERUM: Beta-2 Microglobulin: 3.04 mg/L — ABNORMAL HIGH (ref <=2.51)

## 2014-07-19 MED ORDER — AMOXICILLIN-POT CLAVULANATE 875-125 MG PO TABS: 1.0000 | ORAL_TABLET | Freq: Two times a day (BID) | ORAL | Status: DC

## 2014-07-19 NOTE — Telephone Encounter (Signed)
Aug 875 bid ten d 

## 2014-07-19 NOTE — Telephone Encounter (Signed)
Medication sent to pharmacy. Patient was notified.  

## 2014-07-19 NOTE — Telephone Encounter (Signed)
Pt seen 07/12/14  Symptoms, head congestion, productive cough, green mucous, chest congestion are  All still present   [facility] Friday morning to have his IVIG just fyi  [facility]  Call his cell if you can't reach him at home  [phone]

## 2014-07-20 ENCOUNTER — Other Ambulatory Visit (HOSPITAL_COMMUNITY): Payer: Self-pay | Admitting: Oncology

## 2014-07-20 DIAGNOSIS — C9000 Multiple myeloma not having achieved remission: Secondary | ICD-10-CM

## 2014-07-20 LAB — MULTIPLE MYELOMA PANEL, SERUM
Albumin ELP: 57 % (ref 55.8–66.1)
Alpha-1-Globulin: 6 % — ABNORMAL HIGH (ref 2.9–4.9)
Alpha-2-Globulin: 12.3 % — ABNORMAL HIGH (ref 7.1–11.8)
BETA 2: 4.8 % (ref 3.2–6.5)
Beta Globulin: 7.5 % — ABNORMAL HIGH (ref 4.7–7.2)
Gamma Globulin: 12.4 % (ref 11.1–18.8)
IGM, SERUM: 17 mg/dL — AB (ref 41–251)
IgA: 197 mg/dL (ref 68–379)
IgG (Immunoglobin G), Serum: 670 mg/dL — ABNORMAL LOW (ref 650–1600)
M-Spike, %: NOT DETECTED g/dL
Total Protein: 6.1 g/dL (ref 6.0–8.3)

## 2014-07-20 LAB — KAPPA/LAMBDA LIGHT CHAINS
Kappa free light chain: 1.22 mg/dL (ref 0.33–1.94)
Kappa, lambda light chain ratio: 0.96 (ref 0.26–1.65)
LAMDA FREE LIGHT CHAINS: 1.27 mg/dL (ref 0.57–2.63)

## 2014-07-20 LAB — IMMUNOFIXATION ELECTROPHORESIS
IGA: 192 mg/dL (ref 68–379)
IGG (IMMUNOGLOBIN G), SERUM: 673 mg/dL — AB (ref 650–1600)
IgM, Serum: 18 mg/dL — ABNORMAL LOW (ref 41–251)
Total Protein ELP: 5.9 g/dL — ABNORMAL LOW (ref 6.0–8.3)

## 2014-07-20 MED ORDER — LENALIDOMIDE 10 MG PO CAPS: 10.0000 mg | ORAL_CAPSULE | Freq: Every day | ORAL | Status: DC

## 2014-07-22 DIAGNOSIS — M25519 Pain in unspecified shoulder: Secondary | ICD-10-CM | POA: Diagnosis not present

## 2014-08-02 ENCOUNTER — Ambulatory Visit: Payer: Medicare Other | Admitting: Cardiovascular Disease

## 2014-08-03 ENCOUNTER — Ambulatory Visit (INDEPENDENT_AMBULATORY_CARE_PROVIDER_SITE_OTHER): Payer: Medicare Other | Admitting: Cardiovascular Disease

## 2014-08-03 VITALS — BP 132/81 | HR 78 | Ht 68.0 in | Wt 185.8 lb

## 2014-08-03 DIAGNOSIS — Z950 Presence of cardiac pacemaker: Secondary | ICD-10-CM

## 2014-08-03 DIAGNOSIS — I2510 Atherosclerotic heart disease of native coronary artery without angina pectoris: Secondary | ICD-10-CM | POA: Diagnosis not present

## 2014-08-03 DIAGNOSIS — G4733 Obstructive sleep apnea (adult) (pediatric): Secondary | ICD-10-CM | POA: Diagnosis not present

## 2014-08-03 DIAGNOSIS — G4730 Sleep apnea, unspecified: Secondary | ICD-10-CM

## 2014-08-03 DIAGNOSIS — G4731 Primary central sleep apnea: Secondary | ICD-10-CM

## 2014-08-03 DIAGNOSIS — Z8679 Personal history of other diseases of the circulatory system: Secondary | ICD-10-CM | POA: Diagnosis not present

## 2014-08-03 DIAGNOSIS — Z9989 Dependence on other enabling machines and devices: Principal | ICD-10-CM

## 2014-08-03 NOTE — Patient Instructions (Signed)
Your physician has recommended that you have a sleep study. This test records several body functions during sleep, including: brain activity, eye movement, oxygen and carbon dioxide blood levels, heart rate and rhythm, breathing rate and rhythm, the flow of air through your mouth and nose, snoring, body muscle movements, and chest and belly movement.   Your physician recommends that you schedule a follow-up appointment in:3-4 months with Dr. Claiborne Billings for sleep evaluation

## 2014-08-07 ENCOUNTER — Encounter: Payer: Self-pay | Admitting: Cardiovascular Disease

## 2014-08-07 DIAGNOSIS — G4733 Obstructive sleep apnea (adult) (pediatric): Secondary | ICD-10-CM | POA: Insufficient documentation

## 2014-08-07 DIAGNOSIS — G4731 Primary central sleep apnea: Secondary | ICD-10-CM | POA: Insufficient documentation

## 2014-08-07 NOTE — Progress Notes (Signed)
Patient ID: TIGHE GITTO, male   DOB: 12-May-1941, 73 y.o.   MRN: [id number]     HPI: [facility], is a 73 y.o. male who is followed by Dr. Gwenlyn Parrish for his cardiology care.  He is referred to sleep clinic following initiation of CPAP therapy for obstructive sleep apnea.  [facility] has a history of established coronary artery disease, disease, with prior stenting to his RCA, circumflex vessel, and with his most recent Myoview study in January 2015 revealing lateral scar with an ejection fraction of 35-40%.  He is status post a permanent pacemaker and has a history of hypertension, as well as hyperlipidemia.  He was referred for a polysomnogram, which was done in [facility] at [facility].  This was done in a split-night protocol and demonstrated moderate sleep apnea with an AHI of 21.8.  His RDI was 25.8.  On CPAP titration .  He was very sensitive to pressure.  She was only titrated between 4-7 cm.  Due to development of central events.  Apparently, there was a short trial of BiPAP at 8/4 he was quickly changed back to CPAP.  He has an AirSense 10 AutoSet unit and he was set at a minimum pressure of 6 and maximum pressure of 20.  It appears that he has rached amaximum average pressure of 18.8 cm in his 95th percentile pressure was 17.5..  He's been using a [facility] Simplus fullface mask, medium.  A download was obtained from 06/14/2014 through 07/31/2014.  He did meet Medicare compliance standards with usage days at 93%.  There was 70% of the time when usage was greater than 4 hours and he was averaging 5 hours and 30 minutes of use.  His AHI was increased at 18.6.  His apnea index was 16.2, and central apnea index 9.6, with obstructive index of 5.8  He still notes hypersomnolence.  He is unaware of breakthrough snoring.  An Epworth scale was calculated today, and this is compatible with continued excessive daytime sleepiness as noted below   Epworth Sleepiness Scale: Situation   Chance  of Dozing/Sleeping (0 = never , 1 = slight chance , 2 = moderate chance , 3 = high chance )   sitting and reading 3   watching TV 3   sitting inactive in a public place 3   being a passenger in a motor vehicle for an hour or more 2   lying down in the afternoon 3   sitting and talking to someone 0   sitting quietly after lunch (no alcohol) 2   while stopped for a few minutes in traffic as the driver 0   Total Score  16    Past Medical History  Diagnosis Date  . Hypertension   . Heart disease   . Kidney stones     history  . Lung mass   . Heart murmur   . 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 Coronary artery disease   . Depression   . Shortness of breath   . Peripheral arterial disease   . Bladder neck contracture   . Personal history of other diseases of circulatory system   . Aortic aneurysm of unspecified site without mention of rupture   . Arthritis   . Intestinovesical fistula   . Esophageal reflux   . Hyperlipidemia   . Anemia   . CHF (congestive heart failure)   . COPD (chronic obstructive pulmonary  disease)   . Cerebral atherosclerosis     Carotid Doppler, 02/16/2013 - Bilateral Proximal ICAs,demonstrate mild plaque w/o evidence of significant diameter reduction, dissection, or any other vascular abnormality  . Complication of anesthesia   . PONV (postoperative nausea and vomiting)   . Stroke 2013    Speech.  . Hx of bladder cancer 10/07/2012  . Sleep apnea     05-02-14 cpap , not yet used- suggested settings 5  . Cancer   . Prostate cancer 2000  . Multiple myeloma   . Non Hodgkin's lymphoma   . Myocardial infarction     '96    Past Surgical History  Procedure Laterality Date  . Prostate surgery    . Heart stents x 5  1999  . Portacath placement  07/26/2009    right chest  . Wrist surgery      right  . Left ear skin cancer removed    . Bone marrow transplant  2011  . Pacemaker insertion   07/22/2011    [facility]  . Coronary angioplasty  06/24/2000    PCI and stenting in mid & proximal RCA  . Insert / replace / remove pacemaker    . Tee without cardioversion  10/13/2012    Procedure: TRANSESOPHAGEAL ECHOCARDIOGRAM (TEE);  Surgeon: Jeremy Klein, MD;  Location: [facility];  Service: Cardiovascular;  Laterality: N/A;  pat/kay/echo notified  . Colonoscopy N/A 01/01/2013    Procedure: COLONOSCOPY;  Surgeon: Jeremy Houston, MD;  Location: [facility];  Service: Endoscopy;  Laterality: N/A;  825-moved to [facility] notified pt  . Rotator    . Rotator cuff repair Right   . Colon surgery      colon resection  . Bladder surgery    . Shoulder arthroscopy with subacromial decompression Right 07/21/2013    Procedure: RIGHT SHOULDER ARTHROSCOPY WITH SUBACROMIAL DECOMPRESSION AND DEBRIDEMENT & Injection of Left Shoulder;  Surgeon: Jeremy Corning, MD;  Location: [facility];  Service: Orthopedics;  Laterality: Right;  . US echocardiography  06/19/2011    RV mildly dilated,mild to mod. MR,mild AI,mild PI  . Nm myocar perf wall motion  11/27/2007    inferior scar  . Inguinal hernia repair Right 05/04/2014    Procedure: OPEN RIGHT INGUINAL HERNIA REPAIR with mesh;  Surgeon: Jeremy Jolly, MD;  Location: [facility];  Service: General;  Laterality: Right;    Allergies  Allergen Reactions  . Diphenhydramine Hcl     REACTION: "hyper"  . Morphine And Related Other (See Comments)    hallucinations  . Tape Rash    Paper tape is ok    Current Outpatient Prescriptions  Medication Sig Dispense Refill  . ALPRAZolam (XANAX) 0.5 MG tablet Take 1 tablet (0.5 mg total) by mouth at bedtime as needed for anxiety.  30 tablet  5  . amLODipine (NORVASC) 10 MG tablet Take 1 tablet (10 mg total) by mouth every morning.  90 tablet  2  . aspirin EC 81 MG tablet Take 81 mg by mouth at bedtime.      . cefdinir (OMNICEF) 300 MG capsule Take 1 capsule (300 mg total) by mouth 2 (two) times daily. For 10 days  20 capsule   0  . Cholecalciferol (VITAMIN D3) 400 UNITS CAPS Take 400 Units by mouth daily.      . citalopram (CELEXA) 40 MG tablet TAKE 1 TABLET BY MOUTH EVERY DAY  90 tablet  0  . clopidogrel (PLAVIX) 75 MG tablet Take 75 mg by  mouth every morning.      . gabapentin (NEURONTIN) 600 MG tablet Take 1 tablet (600 mg total) by mouth 3 (three) times daily.  90 tablet  2  . ibuprofen (ADVIL,MOTRIN) 200 MG tablet Take 200 mg by mouth every 6 (six) hours as needed (Pain).      . isosorbide mononitrate (IMDUR) 30 MG 24 hr tablet Take 30 mg by mouth every morning.      Marland Kitchen lenalidomide (REVLIMID) 10 MG capsule Take 1 capsule (10 mg total) by mouth daily. Take 1 capsule by mouth daily for 7 days on followed by 7 days off  14 capsule  0  . nitroGLYCERIN (NITROSTAT) 0.4 MG SL tablet Place 1 tablet (0.4 mg total) under the tongue every 5 (five) minutes as needed for chest pain.  25 tablet  12  . omeprazole (PRILOSEC) 20 MG capsule TAKE 1 CAPSULE EVERY DAY  90 capsule  0  . oxyCODONE-acetaminophen (PERCOCET/ROXICET) 5-325 MG per tablet Take 1 tablet by mouth every 4 (four) hours as needed for severe pain.  30 tablet  0  . potassium chloride SA (KLOR-CON M20) 20 MEQ tablet Take 1 tablet (20 mEq total) by mouth 3 (three) times daily.  90 tablet  3  . simvastatin (ZOCOR) 20 MG tablet Take 1 tablet (20 mg total) by mouth at bedtime.  90 tablet  2  . temazepam (RESTORIL) 15 MG capsule Take 1-2 capsules (15-30 mg total) by mouth at bedtime as needed for sleep.  60 capsule  0  . traMADol (ULTRAM) 50 MG tablet Take 50 mg by mouth every 6 (six) hours as needed (Pain).       . Zoledronic Acid (ZOMETA) 4 MG/100ML IVPB Inject 4 mg into the vein every 30 (thirty) days.       No current facility-administered medications for this visit.   Facility-Administered Medications Ordered in Other Visits  Medication Dose Route Frequency Provider Last Rate Last Dose  . 0.9 %  sodium chloride infusion   Intravenous Once Farrel Gobble, MD        . sodium chloride 0.9 % injection 10 mL  10 mL Intracatheter PRN Farrel Gobble, MD   10 mL at 04/20/14 1540    History   Social History  . Marital Status: Married    Spouse Name: [name]    Number of Children: 3  . Years of Education: 9th   Occupational History  . retired     Social History Main Topics  . Smoking status: Former Smoker -- 1.00 packs/day for 20 years    Types: Cigarettes    Quit date: 11/14/1994  . Smokeless tobacco: Never Used  . Alcohol Use: No     Comment: previously drank but none for at least 15 years.  . Drug Use: No  . Sexual Activity: Not on file   Other Topics Concern  . Not on file   Social History Narrative   Patient lives at home spouse.   Caffeine Use: Occasionally     ROS General: Negative; No fevers, chills, or night sweats HEENT: Negative; No changes in vision or hearing, sinus congestion, difficulty swallowing Pulmonary: Negative; No cough, wheezing, shortness of breath, hemoptysis Cardiovascular: Negative; No chest pain, presyncope, syncope, palpatations GI: Positive for GERD; No nausea, vomiting, diarrhea, or abdominal pain GU: Negative; No dysuria, hematuria, or difficulty voiding Musculoskeletal: Negative; no myalgias, joint pain, or weakness Hematologic: Negative; no easy bruising, bleeding Endocrine: Negative; no heat/cold intolerance Neuro: Negative; no changes in balance, headaches  Skin: Negative; No rashes or skin lesions Psychiatric: Negative; No behavioral problems, depression Sleep: Positive for obstructive sleep apnea with daytime sleepiness, hypersomnolence,  nobruxism, restless legs, hypnogognic hallucinations, no cataplexy   Physical Exam BP 132/81  Pulse 78  Ht 5' 8"  (1.727 m)  Wt 185 lb 12.8 oz (84.278 kg)  BMI 28.26 kg/m2  General: Alert, oriented, no distress.  Skin: normal turgor, no rashes HEENT: Normocephalic, atraumatic. Pupils round and reactive; sclera anicteric; extraocular muscles intact; Fundi  with mild arteriolar narrowing without hemorrhages or exudates Nose without nasal septal hypertrophy Mouth/Parynx benign; Mallinpatti scale 3  Neck: No JVD, no carotid briuts Lungs: clear to ausculatation and percussion; no wheezing or rales  Chest wall: No tenderness to palpation Heart: RRR, s1 s2 normal 1/6 systolic murmur.  No S3 gallop.  No diastolic murmur. Abdomen: soft, nontender; no hepatosplenomehaly, BS+; abdominal aorta nontender and not dilated by palpation. Back: No CVA tenderness Pulses 2+ Extremities: no clubbinbg cyanosis or edema, Homan's sign negative  Neurologic: grossly nonfocal; cranial nerves intact. Psychological: Normal affect and mood.   LABS:  BMET    Component Value Date/Time   NA 142 07/15/2014 0910   K 3.9 07/15/2014 0910   CL 106 07/15/2014 0910   CO2 24 07/15/2014 0910   GLUCOSE 122* 07/15/2014 0910   BUN 16 07/15/2014 0910   CREATININE 0.79 07/15/2014 0910   CREATININE 0.77 04/02/2013 1025   CALCIUM 8.9 07/15/2014 0910   GFRNONAA 88* 07/15/2014 0910   GFRAA >90 07/15/2014 0910     Hepatic Function Panel     Component Value Date/Time   PROT 6.3 07/15/2014 0910   PROT 6.1 07/15/2014 0910   ALBUMIN 3.5 07/15/2014 0910   AST 24 07/15/2014 0910   ALT 19 07/15/2014 0910   ALKPHOS 70 07/15/2014 0910   BILITOT 0.3 07/15/2014 0910   BILIDIR 0.1 03/23/2014 1020   IBILI 0.5 03/23/2014 1020     CBC    Component Value Date/Time   WBC 3.9* 07/15/2014 0910   RBC 3.90* 07/15/2014 0910   HGB 11.5* 07/15/2014 0910   HCT 34.7* 07/15/2014 0910   PLT 104* 07/15/2014 0910   MCV 89.0 07/15/2014 0910   MCH 29.5 07/15/2014 0910   MCHC 33.1 07/15/2014 0910   RDW 15.4 07/15/2014 0910   LYMPHSABS 1.0 07/15/2014 0910   MONOABS 0.6 07/15/2014 0910   EOSABS 0.4 07/15/2014 0910   BASOSABS 0.1 07/15/2014 0910     BNP    Component Value Date/Time   PROBNP 119.0* 04/19/2010 1158    Lipid Panel     Component Value Date/Time   CHOL 113 03/23/2014 1020   TRIG 132 03/23/2014 1020   HDL 33* 03/23/2014 1020    CHOLHDL 3.4 03/23/2014 1020   VLDL 26 03/23/2014 1020   LDLCALC 54 03/23/2014 1020     RADIOLOGY: No results [facility].    ASSESSMENT AND PLAN: Jeremy [facility]rish is a 73 year old male with a history of long-standing coronary obstructive disease dating back to 1996, hypertension, hyperlipidemia, and who is status post permanent pacemaker insertion.  A sleep study confirms at least moderate sleep apnea.  He apparently has had both obstructive and central events.  Presently, he has been maintained on a CPAP unit and has required high pressures with a 95th percentile pressure at 17.5.  He is having a central apnea index of 9.6 on therapy.  Presently, I am recommending that he undergo a complete BiPAP titration trial.  If significant central events continued to  develop on BiPAP therapy, adapt servoventilation may be necessary.  I had long discussion with him regarding the importance of treating his sleep apnea with reference to his cardiovascular comorbidities.  In addition, I discussed the importance of increased sleep duration, since he's currently sleeping for 5 hours and 31 minutes.  We discussed increased mortality associated with sleep deprivation, chronically less than 5 hours.  I will see him back in the office and following 8.  His BiPAP titration, and subsequent download and additional recommendations will be made at that time.  I answered all his questions regarding his current therapy.  Time spent: 25 minutes   Troy Sine, MD, [facility]  08/07/2014 9:20 AM

## 2014-08-09 ENCOUNTER — Other Ambulatory Visit (HOSPITAL_COMMUNITY): Payer: Self-pay | Admitting: Oncology

## 2014-08-09 ENCOUNTER — Telehealth: Payer: Self-pay | Admitting: Cardiovascular Disease

## 2014-08-09 NOTE — Telephone Encounter (Signed)
[facility]y [facility] was calling in to talk to [name] about the CPAP machine. He did not specify the issue, he would just like for you to return his call. Please call  Thanks

## 2014-08-11 NOTE — Telephone Encounter (Signed)
Spoke with pt, aware he can get equipment from [employer]

## 2014-08-11 NOTE — Telephone Encounter (Signed)
Spoke with pt, patient wants to know if there is somewhere local in [place] he can get his equipment from.

## 2014-08-15 ENCOUNTER — Telehealth: Payer: Self-pay | Admitting: Cardiovascular Disease

## 2014-08-15 ENCOUNTER — Telehealth: Payer: Self-pay | Admitting: *Deleted

## 2014-08-15 DIAGNOSIS — Z9481 Bone marrow transplant status: Secondary | ICD-10-CM | POA: Diagnosis not present

## 2014-08-15 DIAGNOSIS — R0600 Dyspnea, unspecified: Secondary | ICD-10-CM | POA: Diagnosis not present

## 2014-08-15 DIAGNOSIS — Z23 Encounter for immunization: Secondary | ICD-10-CM | POA: Diagnosis not present

## 2014-08-15 DIAGNOSIS — J479 Bronchiectasis, uncomplicated: Secondary | ICD-10-CM | POA: Diagnosis not present

## 2014-08-15 NOTE — Telephone Encounter (Signed)
Spoke with Terrie to inform her that the order note says CPAP titration.  This should be BIPAP titration please correct note. Terrie states that she will note order change.

## 2014-08-15 NOTE — Telephone Encounter (Signed)
Message sent to Dr.Kelly's nurse Wanda. 

## 2014-08-15 NOTE — Telephone Encounter (Signed)
Please have [name] to call him. He says he wants to talk to Dr Claiborne Billings about his sleep study.

## 2014-08-18 ENCOUNTER — Encounter (HOSPITAL_BASED_OUTPATIENT_CLINIC_OR_DEPARTMENT_OTHER): Payer: Medicare Other

## 2014-08-18 ENCOUNTER — Encounter (HOSPITAL_COMMUNITY): Payer: Self-pay

## 2014-08-18 ENCOUNTER — Encounter (HOSPITAL_COMMUNITY): Payer: Medicare Other | Attending: Oncology

## 2014-08-18 VITALS — BP 144/73 | HR 70 | Temp 98.0°F | Resp 18 | Wt 186.6 lb

## 2014-08-18 DIAGNOSIS — D801 Nonfamilial hypogammaglobulinemia: Secondary | ICD-10-CM

## 2014-08-18 DIAGNOSIS — C9000 Multiple myeloma not having achieved remission: Secondary | ICD-10-CM | POA: Diagnosis not present

## 2014-08-18 DIAGNOSIS — C9001 Multiple myeloma in remission: Secondary | ICD-10-CM

## 2014-08-18 DIAGNOSIS — Z8572 Personal history of non-Hodgkin lymphomas: Secondary | ICD-10-CM

## 2014-08-18 LAB — COMPREHENSIVE METABOLIC PANEL
ALT: 20 U/L (ref 0–53)
ANION GAP: 12 (ref 5–15)
AST: 25 U/L (ref 0–37)
Albumin: 3.8 g/dL (ref 3.5–5.2)
Alkaline Phosphatase: 70 U/L (ref 39–117)
BUN: 10 mg/dL (ref 6–23)
CALCIUM: 9.1 mg/dL (ref 8.4–10.5)
CO2: 27 mEq/L (ref 19–32)
Chloride: 103 mEq/L (ref 96–112)
Creatinine, Ser: 0.91 mg/dL (ref 0.50–1.35)
GFR, EST NON AFRICAN AMERICAN: 83 mL/min — AB (ref >90)
GLUCOSE: 138 mg/dL — AB (ref 70–99)
Potassium: 3 mEq/L — ABNORMAL LOW (ref 3.7–5.3)
Sodium: 142 mEq/L (ref 137–147)
TOTAL PROTEIN: 7.1 g/dL (ref 6.0–8.3)
Total Bilirubin: 0.6 mg/dL (ref 0.3–1.2)

## 2014-08-18 MED ORDER — IMMUNE GLOBULIN (HUMAN) 20 GM/200ML IV SOLN
35.0000 g | Freq: Once | INTRAVENOUS | Status: AC
  Administered 2014-08-18: 35 g via INTRAVENOUS
  Filled 2014-08-18: qty 350

## 2014-08-18 MED ORDER — HEPARIN SOD (PORK) LOCK FLUSH 100 UNIT/ML IV SOLN
INTRAVENOUS | Status: AC
  Filled 2014-08-18: qty 5

## 2014-08-18 MED ORDER — HEPARIN SOD (PORK) LOCK FLUSH 100 UNIT/ML IV SOLN
500.0000 [IU] | Freq: Once | INTRAVENOUS | Status: AC | PRN
  Administered 2014-08-18: 500 [IU]

## 2014-08-18 MED ORDER — ZOLEDRONIC ACID 4 MG/5ML IV CONC
4.0000 mg | Freq: Once | INTRAVENOUS | Status: AC
  Administered 2014-08-18: 4 mg via INTRAVENOUS
  Filled 2014-08-18: qty 5

## 2014-08-18 MED ORDER — SODIUM CHLORIDE 0.9 % IV SOLN
Freq: Once | INTRAVENOUS | Status: AC
  Administered 2014-08-18: 09:00:00 via INTRAVENOUS

## 2014-08-18 MED ORDER — SODIUM CHLORIDE 0.9 % IJ SOLN: 10.0000 mL | INTRAMUSCULAR | Status: DC | PRN

## 2014-08-18 MED ORDER — SODIUM CHLORIDE 0.9 % IV SOLN: Freq: Once | INTRAVENOUS | Status: DC

## 2014-08-18 NOTE — Progress Notes (Signed)
1230:  Tolerated infusions w/o difficulty. A&ox4, in no distress.

## 2014-08-18 NOTE — Progress Notes (Signed)
LABS FOR CMP, ADDED UPE24 RANDOM

## 2014-08-19 ENCOUNTER — Other Ambulatory Visit (HOSPITAL_COMMUNITY): Payer: Self-pay | Admitting: Oncology

## 2014-08-19 DIAGNOSIS — C9000 Multiple myeloma not having achieved remission: Secondary | ICD-10-CM

## 2014-08-19 MED ORDER — LENALIDOMIDE 10 MG PO CAPS: 10.0000 mg | ORAL_CAPSULE | Freq: Every day | ORAL | Status: DC

## 2014-08-21 NOTE — Progress Notes (Signed)
[facility] OFFICE PROGRESS NOTE  PCP [name], MD [place] [address]  DIAGNOSIS: IgG lamda Multiple Myeloma                        Hypogammaglobulinemia                        History of marginal zone lymphoma of the lung  CURRENT THERAPY:  Revlimid 21m. 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 Monthly Zometa and IVIG infusions  INTERVAL HEMATOLOGY/ONCOLOGY HX: [facility] a 73yo man who returns to the clinic for his monthly supportive care with infusion therapy. He denies bone pain, fevers, stomatitis, edema, or cough. He was previously evaluated 3 months ago and had a short term illness with one episode of vomitting and diarrhea which resolved.    MEDICAL HISTORY:  Past Medical History  Diagnosis Date  . Hypertension   . Heart disease   . Kidney stones     history  . Lung mass   . Heart murmur   . 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 Coronary artery disease   . Depression   . Shortness of breath   . Peripheral arterial disease   . Bladder neck contracture   . Personal history of other diseases of circulatory system   . Aortic aneurysm of unspecified site without mention of rupture   . Arthritis   . Intestinovesical fistula   . Esophageal reflux   . Hyperlipidemia   . Anemia   . CHF (congestive heart failure)   . COPD (chronic obstructive pulmonary disease)   . Cerebral atherosclerosis     Carotid Doppler, 02/16/2013 - Bilateral Proximal ICAs,demonstrate mild plaque w/o evidence of significant diameter reduction, dissection, or any other vascular abnormality  . Complication of anesthesia   . PONV (postoperative nausea and vomiting)   . Stroke 2013    Speech.  . Hx of bladder cancer 10/07/2012  . Sleep apnea     05-02-14 cpap , not yet used-  suggested settings 5  . Cancer   . Prostate cancer 2000  . Multiple myeloma   . Non Hodgkin's lymphoma   . 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    ALLERGIES:  is allergic to diphenhydramine hcl; morphine and related; and tape.  MEDICATIONS: has a current medication list which includes the  following prescription(s): alprazolam, amlodipine, aspirin ec, vitamin d3, citalopram, clopidogrel, gabapentin, ibuprofen, isosorbide mononitrate, lenalidomide, nitroglycerin, omeprazole, oxycodone-acetaminophen, potassium chloride sa, simvastatin, temazepam, tramadol, and zoledronic acid, and the following Facility-Administered Medications: sodium chloride and sodium chloride.  FAMILY HISTORY: family history includes Cancer in his brother and father; Heart attack in his sister; Heart disease in his brother, father, and sister;  Hyperlipidemia in his sister; Hypertension in his mother and sister. There is no history of Colon cancer or Colon polyps.  REVIEW OF SYSTEMS:  Recorded by the nurse and charted in EPIC    Other than that discussed above is noncontributory.    PHYSICAL EXAMINATION:  VS. Recorded by the oncology nurse and charted.     GENERAL:alert, no distress and comfortable EYES: PERRL, EOM intact. Conjunctiva are pink and sclera clear OROPHARYNX:no exudate, no erythema and lips, buccal mucosa, and tongue normal  NECK: supple without cervical or supraclavicular adenopathy. CHEST/BREASTS:  Deferred. LUNGS: clear to auscultation and percussion with normal breathing effort HEART: regular rate & rhythm and no murmurs, S3, or S4 ABDOMEN: soft, non-tender, normal bowel sounds;  Liver and spleen are nonpalpable. No inguinal adenopathy.  MUSCULOSKELETAL: Spine without localized tenderness. No peripheral edema   SKIN: no jaundice, bruises, or rashes. NEURO: alert & oriented x 3 with fluent speech.        LABORATORY DATA: Infusion on 08/18/2014  Component Date Value Ref Range Status  . Sodium 08/18/2014 142  137 - 147 mEq/L Final  . Potassium 08/18/2014 3.0* 3.7 - 5.3 mEq/L Final  . Chloride 08/18/2014 103  96 - 112 mEq/L Final  . CO2 08/18/2014 27  19 - 32 mEq/L Final  . Glucose, Bld 08/18/2014 138* 70 - 99 mg/dL Final  . BUN 08/18/2014 10  6 - 23 mg/dL Final  . Creatinine, Ser 08/18/2014 0.91  0.50 - 1.35 mg/dL Final  . Calcium 08/18/2014 9.1  8.4 - 10.5 mg/dL Final  . Total Protein 08/18/2014 7.1  6.0 - 8.3 g/dL Final  . Albumin 08/18/2014 3.8  3.5 - 5.2 g/dL Final  . AST 08/18/2014 25  0 - 37 U/L Final  . ALT 08/18/2014 20  0 - 53 U/L Final  . Alkaline Phosphatase 08/18/2014 70  39 - 117 U/L Final  . Total Bilirubin 08/18/2014 0.6  0.3 - 1.2 mg/dL Final  . GFR calc non Af Amer 08/18/2014 83* >90 mL/min Final  . GFR calc Af Amer 08/18/2014 >90  >90 mL/min Final   Comment: (NOTE)                           The eGFR has been calculated using the CKD EPI equation.                          This calculation has not been validated in all clinical situations.                          eGFR's persistently <90 mL/min signify possible Chronic Kidney                          Disease.  . Anion gap 08/18/2014 12  5 - 15 Final  . Time 08/18/2014 RANDOM   Final  . Volume, Urine 08/18/2014 RANDOM   Final  . Total Protein, Urine 08/18/2014 23  5 - 25 mg/dL Final  No established reference range.  . Total Protein, Urine-Ur/day 08/18/2014 NOT CALC  <150 mg/day Final   Comment: (NOTE)                          Total urinary protein is determined by adding the albumin and Kappa                          and/or Lambda light chains.  This value may not agree with the total                          protein as determined by chemical methods, which characteristically                          underestimate urinary light chains.                          Performed at [facility]  . Albumin, U 08/18/2014 PENDING   Incomplete  . Alpha 1, Urine 08/18/2014 PENDING   Incomplete  . Alpha 2, Urine 08/18/2014 PENDING   Incomplete  . Beta, Urine 08/18/2014 PENDING   Incomplete  . Gamma Globulin, Urine 08/18/2014 PENDING   Incomplete  . Immunofixation, Urine 08/18/2014 PENDING   Incomplete    RADIOGRAPHIC STUDIES: No results found.  ASSESSMENT:  1. IgG lamda multiple myeloma now on maintenance Revlimid which he is tolerating well. No evidence of disease 2. NHL Stage IV, low grade subtype, successfully treated in July 2002 with Rituxan/CHOP 3. 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 Multiple comorbidities     RECOMMENDATIONS/PLAN:  1. IVIG to maintain an IgG level above 730m. The patient has been receiving long term Zometa and would consider discontinuing these infusions    All questions were answered. The patient knows to call the clinic with any problems, questions or concerns.      Jeremy Dears MD 08/21/2014 9:19 PM

## 2014-08-22 LAB — UIFE/LIGHT CHAINS/TP QN, 24-HR UR
Albumin, U: DETECTED
Alpha 1, Urine: DETECTED — AB
Alpha 2, Urine: DETECTED — AB
Beta, Urine: DETECTED — AB
GAMMA UR: DETECTED — AB
TOTAL PROTEIN, URINE-UPE24: 23 mg/dL (ref 5–25)

## 2014-08-22 NOTE — Telephone Encounter (Signed)
Returned a call to patient regarding his sleep study. He requested for me to call the sleep lab to have him placed on the cancellation list. I did call the sleep lab and the receptionist informed me that he is already on the list, however there's 6- people on the list.

## 2014-08-25 DIAGNOSIS — L923 Foreign body granuloma of the skin and subcutaneous tissue: Secondary | ICD-10-CM | POA: Diagnosis not present

## 2014-08-25 DIAGNOSIS — Z85828 Personal history of other malignant neoplasm of skin: Secondary | ICD-10-CM | POA: Diagnosis not present

## 2014-08-25 DIAGNOSIS — L570 Actinic keratosis: Secondary | ICD-10-CM | POA: Diagnosis not present

## 2014-08-25 DIAGNOSIS — C44629 Squamous cell carcinoma of skin of left upper limb, including shoulder: Secondary | ICD-10-CM | POA: Diagnosis not present

## 2014-08-25 DIAGNOSIS — D492 Neoplasm of unspecified behavior of bone, soft tissue, and skin: Secondary | ICD-10-CM | POA: Diagnosis not present

## 2014-08-29 ENCOUNTER — Ambulatory Visit (HOSPITAL_BASED_OUTPATIENT_CLINIC_OR_DEPARTMENT_OTHER): Payer: Medicare Other | Attending: Cardiovascular Disease | Admitting: Radiology

## 2014-08-29 VITALS — Ht 68.0 in | Wt 185.0 lb

## 2014-08-29 DIAGNOSIS — G4733 Obstructive sleep apnea (adult) (pediatric): Secondary | ICD-10-CM | POA: Insufficient documentation

## 2014-08-29 DIAGNOSIS — Z9989 Dependence on other enabling machines and devices: Secondary | ICD-10-CM

## 2014-09-02 DIAGNOSIS — M75121 Complete rotator cuff tear or rupture of right shoulder, not specified as traumatic: Secondary | ICD-10-CM | POA: Diagnosis not present

## 2014-09-02 DIAGNOSIS — M75122 Complete rotator cuff tear or rupture of left shoulder, not specified as traumatic: Secondary | ICD-10-CM | POA: Diagnosis not present

## 2014-09-06 ENCOUNTER — Ambulatory Visit: Payer: Medicare Other | Admitting: Neurology

## 2014-09-09 ENCOUNTER — Other Ambulatory Visit (HOSPITAL_COMMUNITY): Payer: Self-pay | Admitting: Oncology

## 2014-09-09 ENCOUNTER — Other Ambulatory Visit: Payer: Self-pay | Admitting: Nurse Practitioner

## 2014-09-11 DIAGNOSIS — G4733 Obstructive sleep apnea (adult) (pediatric): Secondary | ICD-10-CM | POA: Diagnosis not present

## 2014-09-11 NOTE — Sleep Study (Signed)
NAME: Jeremy Parrish DATE OF BIRTH:  [birth year] MEDICAL RECORD NUMBER [id number]  LOCATION: [facility]  PHYSICIAN: KELLY,THOMAS A  DATE OF STUDY: 08/29/2014  SLEEP STUDY:  BiPAPTitration               REFERRING PHYSICIAN: Troy Sine, MD  INDICATION FOR STUDY: [name] is a 73 year old male with documented moderate obstructive sleep apnea with an AHI of 21.8 and RDI of 25.8.  The patient was initially prescribed CPAP therapy.  He has done poorly with CPAP therapy and a recent download on a CPAP auto unit revealed an AHI of 18.6, an apnea index of 16.2 with a central apnea index of 9.6,and obstructive index of 5.8 per hour. He is now referred for BiPAP titration.  EPWORTH SLEEPINESS SCORE:  16and is consistent with excessivedaytime sleepiness HEIGHT: 5\' 8"  (172.7 cm)  WEIGHT: 185 lb (83.915 kg)    Body mass index is 28.14 kg/(m^2).  NECK SIZE: 16 in.  MEDICATIONS:  Zoledronic Acid (ZOMETA) 4 MG/100ML IVPB 4 mg, Every 30 days  Note: Last dose 04/20/14 (Written 05/02/2014 1312)  traMADol (ULTRAM) 50 MG tablet 50 mg, Every 6 hours PRN temazepam (RESTORIL) 15 MG capsule 15-30 mg, At bedtime PRN sodium chloride 0.9 % injection 10 mL 10 mL, As needed simvastatin (ZOCOR) 20 MG tablet 20 mg, Daily at bedtime potassium chloride SA (KLOR-CON M20) 20 MEQ tablet 20 mEq, 3 times daily oxyCODONE-acetaminophen (PERCOCET/ROXICET) 5-325 MG per tablet 1 tablet, Every 4 hours PRN omeprazole (PRILOSEC) 20 MG capsule nitroGLYCERIN (NITROSTAT) 0.4 MG SL tablet 0.4 mg, Every 5 min PRN lenalidomide (REVLIMID) 10 MG capsule 10 mg, Daily isosorbide mononitrate (IMDUR) 30 MG 24 hr tablet 30 mg, Every morning - 10a ibuprofen (ADVIL,MOTRIN) 200 MG tablet 200 mg, Every 6 hours PRN gabapentin (NEURONTIN) 600 MG tablet 600 mg, 3 times daily  View all gabapentin notes  clopidogrel (PLAVIX) 75 MG tablet 75 mg, Every morning - 10a  Note: Stopped for procedure (Written 05/02/2014 1309)    citalopram (CELEXA) 40 MG tablet Cholecalciferol (VITAMIN D3) 400 UNITS CAPS 400 Units, Daily aspirin EC 81 MG tablet 81 mg, Daily at bedtime amLODipine (NORVASC) 10 MG tablet 10 mg, Every morning - 10a ALPRAZolam (XANAX) 0.5 MG tablet 0.5 mg, At bedtime PRN 0.9 %   SLEEP ARCHITECTURE: The recording began at 21:56:23 and ended at 4:53:53.  Total sleep time was 349.5 minutes.  Sleep onset was recorded at 18.5 minutes.  The patient spent 26.5 minutes in stage I (7.6%), 275 minutes in stage II (78.7%), 0 time in stage III, and 48 minutes REM sleep (13.7%).  Onset to REM sleep was 91.5 minutes.  Sleep architecture was abnormal with absence of stage III sleep.  There was normal time to REM onset.  Sleep duration was 399 minutes with a sleep efficiency of 83.7%.   RESPIRATORY DATA: BiPAP titration was started at 8 over 4 cm water pressure.  This was ultimately titrated to 15/10 cm water pressure.  At pressures above 11/7, there developed significant increased frequency of central apneic events.  The AHI at 11/7 was 2.3 with an oxygen nadir of 91% and with only 1 central apneic event.  At a pressure of 12/8.  There were 24, central events, with an AHI of 48.9, at 14/8.  There were 81 central events with an AHI of 42.2, and at 15/10, there were 18, central events with an AHI of 14.8.  Optimal pressure appears to be 11/7  cm water pressure.  OXYGEN DATA: The average oxygen's saturation was 93.9% with 94.1% during REM sleep and 93.9% with non-REM sleep.  The lowest oxygen saturation with non-REM sleep was 85% and with REM sleep was 87%.  Minutes with oxygen saturation less than 90% was 11.9 (2.9%) minutes less than 88% was 1.8 (0.45).   CARDIAC DATA: the patient has an underlying pacemaker.  His predominant rhythm was sinus rhythm.  There were occasional PVCs.  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  The index was 6.9 per hour with 0.2 per hour to arousal.  IMPRESSION/ RECOMMENDATION:   BiPAP therapy with a Bi-Flex/EPR of 3 at 11/7 cm water pressure   is the initial recommended pressure. At this pressure, the patient had good sleep efficiency without significant central events.  Recommend download in 30 days and sleep clinic follow-up evaluation.  [place], American Board of Sleep Medicine  ELECTRONICALLY SIGNED ON:  09/11/2014, 2:32 PM [place] PH: [phone]   FX: [phone] [place]

## 2014-09-16 ENCOUNTER — Other Ambulatory Visit: Payer: Self-pay | Admitting: Family Medicine

## 2014-09-19 ENCOUNTER — Other Ambulatory Visit (HOSPITAL_COMMUNITY): Payer: Medicare Other

## 2014-09-19 ENCOUNTER — Encounter (HOSPITAL_COMMUNITY): Payer: Medicare Other | Attending: Oncology | Admitting: Oncology

## 2014-09-19 ENCOUNTER — Ambulatory Visit (HOSPITAL_COMMUNITY): Payer: Medicare Other

## 2014-09-19 ENCOUNTER — Other Ambulatory Visit (HOSPITAL_COMMUNITY): Payer: Self-pay | Admitting: Oncology

## 2014-09-19 ENCOUNTER — Encounter (HOSPITAL_COMMUNITY): Payer: Self-pay | Admitting: Oncology

## 2014-09-19 ENCOUNTER — Other Ambulatory Visit: Payer: Self-pay | Admitting: Family Medicine

## 2014-09-19 VITALS — BP 145/78 | HR 79 | Temp 97.7°F | Resp 18 | Wt 186.5 lb

## 2014-09-19 DIAGNOSIS — M12811 Other specific arthropathies, not elsewhere classified, right shoulder: Secondary | ICD-10-CM

## 2014-09-19 DIAGNOSIS — M75102 Unspecified rotator cuff tear or rupture of left shoulder, not specified as traumatic: Secondary | ICD-10-CM

## 2014-09-19 DIAGNOSIS — D801 Nonfamilial hypogammaglobulinemia: Secondary | ICD-10-CM

## 2014-09-19 DIAGNOSIS — C9000 Multiple myeloma not having achieved remission: Secondary | ICD-10-CM | POA: Diagnosis not present

## 2014-09-19 DIAGNOSIS — M75101 Unspecified rotator cuff tear or rupture of right shoulder, not specified as traumatic: Secondary | ICD-10-CM

## 2014-09-19 DIAGNOSIS — F411 Generalized anxiety disorder: Secondary | ICD-10-CM

## 2014-09-19 DIAGNOSIS — F419 Anxiety disorder, unspecified: Secondary | ICD-10-CM

## 2014-09-19 DIAGNOSIS — C9001 Multiple myeloma in remission: Secondary | ICD-10-CM | POA: Insufficient documentation

## 2014-09-19 DIAGNOSIS — M12812 Other specific arthropathies, not elsewhere classified, left shoulder: Secondary | ICD-10-CM

## 2014-09-19 MED ORDER — OXYCODONE-ACETAMINOPHEN 5-325 MG PO TABS: 1.0000 | ORAL_TABLET | Freq: Three times a day (TID) | ORAL | Status: DC | PRN

## 2014-09-19 MED ORDER — ALPRAZOLAM 0.5 MG PO TABS: 0.5000 mg | ORAL_TABLET | Freq: Every evening | ORAL | Status: DC | PRN

## 2014-09-19 MED ORDER — TRAMADOL HCL 50 MG PO TABS: 50.0000 mg | ORAL_TABLET | Freq: Four times a day (QID) | ORAL | Status: DC | PRN

## 2014-09-19 NOTE — Progress Notes (Signed)
Jeremy Parrish is seen as a work-in today.  He had a few questions he wanted to ask me.  Jeremy Parrish was seen by Dr. Bubba Hales (Med Onc) who was a locum tenens helping [place] out at the [facility] for a short time.  His note is reviewed and he made the following recommendations: 1. D/C bisphosphonate therapy after Chriss Czar has been taking this therapy monthly x years 2. Change IVIG to every 8 weeks (instead of every 4 weeks)  His supportive therapy plan was altered to reflect the aforementioned changes.  Jeremy Parrish wanted to know if these changes were appropriate and whether I thought these were good ideas. I think in the setting of his panhypogammaglobulinemia and significant decrease in infections over the past 12 months, a trial of IVIG every 8 weeks is appropriate.  Maybe in the future, a further decrease in frequency can be performed depending on how well he does with every 8 week therapy.   With regards to bisphosphonate therapy (Zometa), there is no good clinical trials available to guide length of treatment.   The length of optimal treatment is not well established, but it is frequently continued for 5 years or more in patients with osteoporosis, but those with multiple myeloma, data is limited. Some trials continued IV bisphosphonate therapy for up to 1 year and then transition to oral bisphosphonate therapy for two years or beyond.  It is unclear if therapy beyond two years provides skeletal benefit.  With a lack of evidence, the common consensus is to administer treatment for 2 years and then the decision to continue therapy can be reassessed.  Some experts discontinue therapy after two years in patient who have attained a complete response, very good partial response, or stable plateau phase with no active bone disease. Bisphosphonate therapy can be discontinued in the absence of active bone disease or progressive multiple myeloma.  However, most experts agree that bisphosphonate therapy should be restarted upon  relapse of multiple myeloma (others reinitiate therapy with bone pain, progressive bone involvement at relapse or new skeletal complication).  Other treatment options regarding Zometa include decreasing frequency of infusions from monthly to every 3 months.  There is no clinical data to support scheduled other than monthly are effective.   I will defer the decision of Zometa to attending oncologist, but for the time being, discontinuation of bisphosphonate therapy is appropriate.   Jeremy Parrish also asks for refills on a few medications: Xanax Percocet Ultram  Jeremy Parrish will continue with IVIG every 8 weeks and hold Zometa for the time being.  He will return as scheduled for follow-up next month.  He reports that he has an upcoming appointment with Dr. Marcell Anger at [facility].  Patient and plan discussed with Dr. Farrel Gobble and he is in agreement with the aforementioned.   Amarianna Abplanalp 09/19/2014

## 2014-09-19 NOTE — Patient Instructions (Signed)
[facility] Discharge Instructions  RECOMMENDATIONS MADE BY THE CONSULTANT AND ANY TEST RESULTS WILL BE SENT TO YOUR REFERRING PHYSICIAN.  You may take an over-the-counter B vitamin.  I recommend a "B Vitamin Complex pill" as directed according to the label.  You may get this at your local pharmacy. You may take an over-the-counter Vitamin C 500 mg daily if you would like for you immune system. Refill on medication provided today: Xanax, Percocet, Ultram Return as planned for infusion and MD appointment  Thank you for choosing [facility] to provide your oncology and hematology care.  To afford each patient quality time with our providers, please arrive at least 15 minutes before your scheduled appointment time.  With your help, our goal is to use those 15 minutes to complete the necessary work-up to ensure our physicians have the information they need to help with your evaluation and healthcare recommendations.    Effective January 1st, 2014, we ask that you re-schedule your appointment with our physicians should you arrive 10 or more minutes late for your appointment.  We strive to give you quality time with our providers, and arriving late affects you and other patients whose appointments are after yours.    Again, thank you for choosing [facility].  Our hope is that these requests will decrease the amount of time that you wait before being seen by our physicians.       _____________________________________________________________  Should you have questions after your visit to [facility], please contact our office at [phone] between the hours of 8:30 a.m. and 5:00 p.m.  Voicemails left after 4:30 p.m. will not be returned until the following business day.  For prescription refill requests, have your pharmacy contact our office with your prescription refill request.

## 2014-09-21 ENCOUNTER — Other Ambulatory Visit (HOSPITAL_COMMUNITY): Payer: Self-pay | Admitting: Oncology

## 2014-09-21 DIAGNOSIS — C9000 Multiple myeloma not having achieved remission: Secondary | ICD-10-CM | POA: Diagnosis not present

## 2014-09-21 DIAGNOSIS — Z9481 Bone marrow transplant status: Secondary | ICD-10-CM | POA: Diagnosis not present

## 2014-09-21 DIAGNOSIS — Z8546 Personal history of malignant neoplasm of prostate: Secondary | ICD-10-CM | POA: Diagnosis not present

## 2014-09-21 DIAGNOSIS — D801 Nonfamilial hypogammaglobulinemia: Secondary | ICD-10-CM | POA: Diagnosis not present

## 2014-09-21 DIAGNOSIS — Z79899 Other long term (current) drug therapy: Secondary | ICD-10-CM | POA: Diagnosis not present

## 2014-09-21 MED ORDER — LENALIDOMIDE 10 MG PO CAPS: 10.0000 mg | ORAL_CAPSULE | Freq: Every day | ORAL | Status: DC

## 2014-10-05 ENCOUNTER — Encounter: Payer: Self-pay | Admitting: *Deleted

## 2014-10-13 DIAGNOSIS — D485 Neoplasm of uncertain behavior of skin: Secondary | ICD-10-CM | POA: Diagnosis not present

## 2014-10-13 DIAGNOSIS — L570 Actinic keratosis: Secondary | ICD-10-CM | POA: Diagnosis not present

## 2014-10-13 DIAGNOSIS — C44229 Squamous cell carcinoma of skin of left ear and external auricular canal: Secondary | ICD-10-CM | POA: Diagnosis not present

## 2014-10-13 DIAGNOSIS — C44629 Squamous cell carcinoma of skin of left upper limb, including shoulder: Secondary | ICD-10-CM | POA: Diagnosis not present

## 2014-10-14 ENCOUNTER — Encounter (HOSPITAL_COMMUNITY): Payer: Medicare Other | Attending: Oncology

## 2014-10-14 ENCOUNTER — Encounter (HOSPITAL_BASED_OUTPATIENT_CLINIC_OR_DEPARTMENT_OTHER): Payer: Medicare Other

## 2014-10-14 ENCOUNTER — Encounter (HOSPITAL_COMMUNITY): Payer: Self-pay

## 2014-10-14 DIAGNOSIS — C9001 Multiple myeloma in remission: Secondary | ICD-10-CM

## 2014-10-14 DIAGNOSIS — Z8551 Personal history of malignant neoplasm of bladder: Secondary | ICD-10-CM | POA: Diagnosis not present

## 2014-10-14 DIAGNOSIS — D801 Nonfamilial hypogammaglobulinemia: Secondary | ICD-10-CM

## 2014-10-14 DIAGNOSIS — Z87891 Personal history of nicotine dependence: Secondary | ICD-10-CM

## 2014-10-14 DIAGNOSIS — I2510 Atherosclerotic heart disease of native coronary artery without angina pectoris: Secondary | ICD-10-CM

## 2014-10-14 DIAGNOSIS — C8590 Non-Hodgkin lymphoma, unspecified, unspecified site: Secondary | ICD-10-CM

## 2014-10-14 DIAGNOSIS — J449 Chronic obstructive pulmonary disease, unspecified: Secondary | ICD-10-CM | POA: Diagnosis not present

## 2014-10-14 DIAGNOSIS — M810 Age-related osteoporosis without current pathological fracture: Secondary | ICD-10-CM | POA: Diagnosis not present

## 2014-10-14 DIAGNOSIS — C9000 Multiple myeloma not having achieved remission: Secondary | ICD-10-CM | POA: Diagnosis not present

## 2014-10-14 LAB — COMPREHENSIVE METABOLIC PANEL
ALT: 12 U/L (ref 0–53)
ANION GAP: 13 (ref 5–15)
AST: 19 U/L (ref 0–37)
Albumin: 3.5 g/dL (ref 3.5–5.2)
Alkaline Phosphatase: 82 U/L (ref 39–117)
BUN: 9 mg/dL (ref 6–23)
CO2: 26 meq/L (ref 19–32)
Calcium: 9.1 mg/dL (ref 8.4–10.5)
Chloride: 103 mEq/L (ref 96–112)
Creatinine, Ser: 0.91 mg/dL (ref 0.50–1.35)
GFR calc Af Amer: >90 mL/min (ref >90)
GFR, EST NON AFRICAN AMERICAN: 83 mL/min — AB (ref >90)
Glucose, Bld: 125 mg/dL — ABNORMAL HIGH (ref 70–99)
Potassium: 2.9 mEq/L — CL (ref 3.7–5.3)
Sodium: 142 mEq/L (ref 137–147)
TOTAL PROTEIN: 6.7 g/dL (ref 6.0–8.3)
Total Bilirubin: 0.4 mg/dL (ref 0.3–1.2)

## 2014-10-14 LAB — CBC WITH DIFFERENTIAL/PLATELET
Basophils Absolute: 0.1 10*3/uL (ref 0.0–0.1)
Basophils Relative: 2 % — ABNORMAL HIGH (ref 0–1)
EOS ABS: 0.2 10*3/uL (ref 0.0–0.7)
EOS PCT: 3 % (ref 0–5)
HCT: 35.4 % — ABNORMAL LOW (ref 39.0–52.0)
Hemoglobin: 12.2 g/dL — ABNORMAL LOW (ref 13.0–17.0)
LYMPHS ABS: 1.3 10*3/uL (ref 0.7–4.0)
LYMPHS PCT: 28 % (ref 12–46)
MCH: 29.7 pg (ref 26.0–34.0)
MCHC: 34.5 g/dL (ref 30.0–36.0)
MCV: 86.1 fL (ref 78.0–100.0)
MONOS PCT: 14 % — AB (ref 3–12)
Monocytes Absolute: 0.7 10*3/uL (ref 0.1–1.0)
Neutro Abs: 2.4 10*3/uL (ref 1.7–7.7)
Neutrophils Relative %: 53 % (ref 43–77)
Platelets: 168 10*3/uL (ref 150–400)
RBC: 4.11 MIL/uL — AB (ref 4.22–5.81)
RDW: 14.9 % (ref 11.5–15.5)
WBC: 4.7 10*3/uL (ref 4.0–10.5)

## 2014-10-14 LAB — LACTATE DEHYDROGENASE: LDH: 155 U/L (ref 94–250)

## 2014-10-14 MED ORDER — SODIUM CHLORIDE 0.9 % IV SOLN
Freq: Once | INTRAVENOUS | Status: AC
  Administered 2014-10-14: 09:00:00 via INTRAVENOUS

## 2014-10-14 MED ORDER — HEPARIN SOD (PORK) LOCK FLUSH 100 UNIT/ML IV SOLN
500.0000 [IU] | Freq: Once | INTRAVENOUS | Status: AC | PRN
  Administered 2014-10-14: 500 [IU]

## 2014-10-14 MED ORDER — ACETAMINOPHEN 325 MG PO TABS
650.0000 mg | ORAL_TABLET | Freq: Four times a day (QID) | ORAL | Status: DC | PRN
  Administered 2014-10-14: 650 mg via ORAL

## 2014-10-14 MED ORDER — SODIUM CHLORIDE 0.9 % IJ SOLN
10.0000 mL | INTRAMUSCULAR | Status: DC | PRN
  Administered 2014-10-14: 10 mL
  Filled 2014-10-14: qty 10

## 2014-10-14 MED ORDER — IMMUNE GLOBULIN (HUMAN) 20 GM/200ML IV SOLN
35.0000 g | Freq: Once | INTRAVENOUS | Status: AC
Start: 2014-10-14 — End: 2014-10-14
  Administered 2014-10-14: 35 g via INTRAVENOUS
  Filled 2014-10-14: qty 50

## 2014-10-14 NOTE — Progress Notes (Signed)
Tolerated IVIG well 

## 2014-10-14 NOTE — Progress Notes (Signed)
[name]  OFFICE PROGRESS NOTE  Jeremy Battiest, MD [place] [address]  DIAGNOSIS: Multiple myeloma in remission - Plan: CBC with Differential, Comprehensive metabolic panel, Lactate dehydrogenase, Immunofixation electrophoresis, Beta 2 microglobuline, serum, Multiple myeloma panel, serum, Kappa/lambda light chains  Hypogammaglobulinemia - Plan: Immunofixation electrophoresis  Chief Complaint  Patient presents with  . Multiple Myeloma  . 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 male returns for followup while taking maintenance therapy with Revlimid 10 mg daily for 7 days on and 7 days off after undergoing autologous peripheral blood stem cell transplant for IgG lambda multiple myeloma in October of 2011 at [facility] under the care of Dr. Marcell Parrish. 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 In 2011 he was diagnosed with IgG lambda multiple myeloma and treated with 4 cycles of Velcade and dexamethasone followed by autologous peripheral blood stem cell transplant and currently remains on maintenance Revlimid and also monthly intravenous IgG plus Zometa 4 mg intravenously monthly, last treatment on 08/18/2014 with Zometa. He had a tumor removed from the pinna of his left ear about a week ago and does have occasional bleeding when he lays on it. He also had cauterized lesions treated on the left upper extremity. Appetite is good with no nausea or vomiting. He denies any diarrhea, constipation, peripheral paresthesias, PND, orthopnea, palpitations, chest  pain, skin rash, headache, or seizures.  MEDICAL HISTORY: Past Medical History  Diagnosis Date  . Hypertension   . Heart disease   . Kidney stones     history  . Lung mass   . Heart murmur   . 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 Coronary artery disease   . Depression   . Shortness of breath   . Peripheral arterial disease   . Bladder neck contracture   . Personal history of other diseases of circulatory system   . Aortic aneurysm of unspecified site without mention of rupture   . Arthritis   . Intestinovesical fistula   . Esophageal reflux   . Hyperlipidemia   . Anemia   . CHF (congestive heart failure)   . COPD (chronic obstructive pulmonary disease)   . Cerebral atherosclerosis     Carotid Doppler, 02/16/2013 - Bilateral Proximal ICAs,demonstrate mild plaque w/o evidence of significant diameter reduction, dissection, or any other vascular abnormality  . Complication of anesthesia   . PONV (postoperative nausea and vomiting)   . Stroke 2013    Speech.  . Hx of bladder cancer 10/07/2012  . Sleep apnea     05-02-14 cpap , not yet used- suggested settings 5  . Cancer   . Prostate cancer 2000  . Multiple myeloma   . Non Hodgkin's lymphoma   . 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; H/O cardiac pacemaker, Medtronic REVO, MRI conditional device, placed 07/2011 for sympyomatic bradycardia; Hx of bladder cancer; Hyperlipidemia LDL goal <  100; Prediabetes; Expressive aphasia; Hemiplegia affecting right dominant side; Aneurysm of iliac artery; Shortness of breath; Rotator cuff tear arthropathy of right shoulder; Left rotator cuff tear arthropathy; Muscle weakness (generalized); Status post arthroscopy of shoulder; Pain in joint, shoulder region; Fever; Pneumonia; Pancytopenia; Chest pain with moderate risk for cardiac etiology; Inguinal hernia; Obstructive sleep apnea; and Central sleep apnea on his problem list.   [facility] note 09/21/2014 (Dr. Marcell Parrish)  Jeremy Parrish oncologic history began in 2002 when he was diagnosed with prostate cancer.   He underwent radical prostatectomy shortly thereafter and then continued on cancer surveillance.   In July 2007, he was found to have a new mass lesion in his lung.   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   He received six cycles of R-CHOP with a good response   He once again entered surveillance staging and follow up scans revealed no evidence of disease recurrence.   In September 2009, it was noticed that Jeremy Parrish total protein was elevated at 8.4. This was followed up in 02/2009 with a total protein increased to 9.1 with an albumin of 3.4.   Of note, in October 2009, a PET CT was done, which was unremarkable for any disease recurrence.   In further defining Jeremy Parrish total protein and SPEP and immuno electrophoresis was obtained showing in an M-spike of 3.03.   Quantitative immunoglobulins revealed IgG of 4240 IgA of 24 IgM 32.   A bone survey showed lucent bone lesions in the calvarium proximal left and right humerus and proximal left and right femur.   A bone marrow biopsy and aspiration was then obtained revealing 40% plasma cell infiltrate with several scattered CD3 positive lymphoid aggregates. Flow cytometry was done which did not show any evidence of abnormal B-cell population or abnormal T-cell phenotype.    TREATMENT HISTORY:   Six cycles of R-CHOP for marginal zone lymphoma with good response.   Four cycles of Velcade, revlimid, and dexamethasone with some peripheral neuropathy noted  Autologous peripheral blood stem cell transplant   Mobilization: Rituxan and cyclophosphamide - September 2010  Preparative regimen: Melphalan 200 mg/m2  Day 0 = 08/24/09. He received 8.86 x 10(6) CD34+ cells/kg   Post transplant maintenance with Revlimid 10 mg 7 days on, 7 days off  Zometa monthly, given locally  POSTTRANSPLANT COMPLICATIONS:  1. Severe mucositis.  2. Clostridium difficile negative diarrhea.  3. Ventricular ectopy thought secondary to hypokalemia.  4. Hypogammaglobulinemia for which he has been receiving IVIg replacement every other month.  POST TRANSPLANT EVALUATIONS: Date SPEP gm/dl IFIX IgG mg/dl IgA mg/dl IgM mg/dl Kappa mg/L Lambda mg/L  09/21/2014 Negative Polyclonal 789 220 32 25.8 23.1  09/22/2013 Negative Polyclonal 1638 257 50 22.6 23.7  09/07/2012 Negative Not Done 514 157 24 18.4 18.8  09/09/2011 0.27 Oligoclonal IgG Kappa & IgG Lambda 746 196 < $Rem'25 31 22  'MQsw$ 08/22/2010 Oligoclonal IgG Lambda x 3 473 33 < 25 7.9 3.0  02/21/2010 Negative Polyclonal 2089 < 6 < 25 3.1 < 1.9  08/22/2009 Pre-Transplant 0.11 IgG Lambda 270 12 < 25 2.3 5.6 Assessment and Plan: (09/21/2014 Dr. Marcell Parrish)   IgG lambda  multiple myeloma   S/p autologous PBSCT (5 years post transplant) His counts are overall stable.   No evidence of disease based on his markers.   At this time, we would recommend continuing maintenance Revlimid 10mg  daily for 7 days on and 7 days off as he has been doing  Bisphosphonate therapy.   No longer receiving Zometa. He has received approximately 5 years of monthly therapy.  Hypogammaglobulinemia  Continues on replacement therapy every other month  Could consider holding the therapy from April - September and give only during October - March (during the cold and flu  season). However should he experience significant summer time infections, then would need to return to schedule year round.   Marginal Zone lymphoma   no evidence of active disease at this time by physical exam or blood work  No indication for doing scans at this time.  Health maintenance.   Mr. Farney received the influenza vaccination on 08/15/2014 in the [facility] Clinic.   Pneumococcal vaccine is current (09/08/2010).  NOT a candidate for the zoster vaccine  ALLERGIES:  is allergic to diphenhydramine hcl; morphine and related; and tape.  MEDICATIONS: has a current medication list which includes the following prescription(s): alprazolam, amlodipine, aspirin ec, vitamin d3, citalopram, clopidogrel, gabapentin, ibuprofen, isosorbide mononitrate, lenalidomide, nitroglycerin, omeprazole, oxycodone-acetaminophen, potassium chloride sa, simvastatin, temazepam, tramadol, and zolendronic acid, and the following Facility-Administered Medications: acetaminophen, heparin lock flush, and sodium chloride.  SURGICAL HISTORY:  Past Surgical History  Procedure Laterality Date  . Prostate surgery    . Heart stents x 5  1999  . Portacath placement  07/26/2009    right chest  . Wrist surgery      right  . Left ear skin cancer removed    . Bone marrow transplant  2011  . Pacemaker insertion  07/22/2011    Medtronic  . Coronary angioplasty  06/24/2000    PCI and stenting in mid & proximal RCA  . Insert / replace / remove pacemaker    . Tee without cardioversion  10/13/2012    Procedure: TRANSESOPHAGEAL ECHOCARDIOGRAM (TEE);  Surgeon: Sanda Klein, MD;  Location: [facility] ENDOSCOPY;  Service: Cardiovascular;  Laterality: N/A;  pat/kay/echo notified  . Colonoscopy N/A 01/01/2013    Procedure: COLONOSCOPY;  Surgeon: Rogene Houston, MD;  Location: [facility] ENDO SUITE;  Service: Endoscopy;  Laterality: N/A;  825-moved to [place] notified pt  . Rotator    . Rotator cuff repair Right   . Colon surgery        colon resection  . Bladder surgery    . Shoulder arthroscopy with subacromial decompression Right 07/21/2013    Procedure: RIGHT SHOULDER ARTHROSCOPY WITH SUBACROMIAL DECOMPRESSION AND DEBRIDEMENT & Injection of Left Shoulder;  Surgeon: Alta Corning, MD;  Location: [facility];  Service: Orthopedics;  Laterality: Right;  . US echocardiography  06/19/2011    RV mildly dilated,mild to mod. MR,mild AI,mild PI  . Nm myocar perf wall motion  11/27/2007    inferior scar  . Inguinal hernia repair Right 05/04/2014    Procedure: OPEN RIGHT INGUINAL HERNIA REPAIR with mesh;  Surgeon: Edward Jolly, MD;  Location: [facility] ORS;  Service: General;  Laterality: Right;    FAMILY HISTORY: family history includes Cancer in his brother and father; Heart attack in his sister; Heart disease in his brother, father, and sister; Hyperlipidemia in his sister; Hypertension in his mother and sister. There is no history of Colon cancer or Colon polyps.  SOCIAL HISTORY:  reports that he quit smoking about 19 years ago. His smoking use included Cigarettes. He has a 20 pack-year smoking history. He has never used smokeless tobacco. He reports that he does not drink alcohol or use illicit drugs.  REVIEW OF SYSTEMS:  Other than that discussed above is noncontributory.  PHYSICAL EXAMINATION: ECOG PERFORMANCE STATUS: 1 - Symptomatic but completely ambulatory  There were no vitals taken for this visit.  GENERAL:alert, no distress and comfortable SKIN: skin color, texture, turgor are normal, no rashes or significant lesions EYES: PERLA; Conjunctiva are pink and non-injected, sclera clear EARS: Left upper pinna is bandaged. SINUSES: No redness or tenderness over maxillary or ethmoid sinuses OROPHARYNX:no exudate, no erythema on lips, buccal mucosa, or tongue. NECK: supple, thyroid normal size, non-tender, without nodularity. No masses CHEST: LifePort in place right anterior chest with no gynecomastia. LYMPH:  no palpable  lymphadenopathy in the cervical, axillary or inguinal LUNGS: clear to auscultation and percussion with normal breathing effort HEART: regular rate & rhythm and no murmurs. ABDOMEN:abdomen soft, non-tender and normal bowel sounds. Liver and spleen not enlarged. No ascites. MUSCULOSKELETAL:no cyanosis of digits and no clubbing. Range of motion normal.  NEURO: alert & oriented x 3 with expressive dysphasia, no focal motor/sensory deficits   LABORATORY DATA:  Results for PRESTON, GARABEDIAN (MRN [id number]) as of 10/14/2014 09:40  Ref. Range 05/18/2014 08:53 05/18/2014 08:54 07/15/2014 09:10 07/15/2014 09:10  IgG (Immunoglobin G), Serum Latest Range: [phone] mg/dL 815 794 670 (L) 673 (L)     Lab on 10/14/2014  Component Date Value Ref Range Status  . WBC 10/14/2014 4.7  4.0 - 10.5 K/uL Final  . RBC 10/14/2014 4.11* 4.22 - 5.81 MIL/uL Final  . Hemoglobin 10/14/2014 12.2* 13.0 - 17.0 g/dL Final  . HCT 10/14/2014 35.4* 39.0 - 52.0 % Final  . MCV 10/14/2014 86.1  78.0 - 100.0 fL Final  . MCH 10/14/2014 29.7  26.0 - 34.0 pg Final  . MCHC 10/14/2014 34.5  30.0 - 36.0 g/dL Final  . RDW 10/14/2014 14.9  11.5 - 15.5 % Final  . Platelets 10/14/2014 168  150 - 400 K/uL Final  . Neutrophils Relative % 10/14/2014 53  43 - 77 % Final  . Neutro Abs 10/14/2014 2.4  1.7 - 7.7 K/uL Final  . Lymphocytes Relative 10/14/2014 28  12 - 46 % Final  . Lymphs Abs 10/14/2014 1.3  0.7 - 4.0 K/uL Final  . Monocytes Relative 10/14/2014 14* 3 - 12 % Final  . Monocytes Absolute 10/14/2014 0.7  0.1 - 1.0 K/uL Final  . Eosinophils Relative 10/14/2014 3  0 - 5 % Final  . Eosinophils Absolute 10/14/2014 0.2  0.0 - 0.7 K/uL Final  . Basophils Relative 10/14/2014 2* 0 - 1 % Final  . Basophils Absolute 10/14/2014 0.1  0.0 - 0.1 K/uL Final  . Sodium 10/14/2014 142  137 - 147 mEq/L Final  . Potassium 10/14/2014 2.9* 3.7 - 5.3 mEq/L Final   Comment: CRITICAL RESULT CALLED TO, READ BACK BY AND VERIFIED WITH: DOYLE,J AT 9:30AM ON  10/14/14 BY FESTERMAN,C   . Chloride 10/14/2014 103  96 - 112 mEq/L Final  . CO2 10/14/2014 26  19 - 32 mEq/L Final  . Glucose, Bld 10/14/2014 125* 70 - 99 mg/dL Final  . BUN 10/14/2014 9  6 - 23 mg/dL Final  . Creatinine, Ser 10/14/2014 0.91  0.50 - 1.35 mg/dL Final  . Calcium 10/14/2014 9.1  8.4 - 10.5 mg/dL Final  . Total Protein 10/14/2014  6.7  6.0 - 8.3 g/dL Final  . Albumin 10/14/2014 3.5  3.5 - 5.2 g/dL Final  . AST 10/14/2014 19  0 - 37 U/L Final  . ALT 10/14/2014 12  0 - 53 U/L Final  . Alkaline Phosphatase 10/14/2014 82  39 - 117 U/L Final  . Total Bilirubin 10/14/2014 0.4  0.3 - 1.2 mg/dL Final  . GFR calc non Af Amer 10/14/2014 83* >90 mL/min Final  . GFR calc Af Amer 10/14/2014 >90  >90 mL/min Final   Comment: (NOTE) The eGFR has been calculated using the CKD EPI equation. This calculation has not been validated in all clinical situations. eGFR's persistently <90 mL/min signify possible Chronic Kidney Disease.   . Anion gap 10/14/2014 13  5 - 15 Final  . LDH 10/14/2014 155  94 - 250 U/L Final  . Total Protein 10/14/2014 PENDING  6.0 - 8.3 g/dL Incomplete  . Albumin ELP 10/14/2014 PENDING  55.8 - 66.1 % Incomplete  . Alpha-1-Globulin 10/14/2014 PENDING  2.9 - 4.9 % Incomplete  . Alpha-2-Globulin 10/14/2014 PENDING  7.1 - 11.8 % Incomplete  . Beta Globulin 10/14/2014 PENDING  4.7 - 7.2 % Incomplete  . Beta 2 10/14/2014 PENDING  3.2 - 6.5 % Incomplete  . Gamma Globulin 10/14/2014 PENDING  11.1 - 18.8 % Incomplete  . M-Spike, % 10/14/2014 PENDING   Incomplete  . SPE Interp. 10/14/2014 PENDING   Incomplete  . Comment 10/14/2014 PENDING   Incomplete  . IgG (Immunoglobin G), Serum 10/14/2014 PENDING  694 - 1618 mg/dL Incomplete  . IgA 10/14/2014 PENDING  68 - 378 mg/dL Incomplete  . IgM, Serum 10/14/2014 PENDING  60 - 263 mg/dL Incomplete  . Immunofix Electr Int 10/14/2014 PENDING   Incomplete    PATHOLOGY: No new pathology.  Urinalysis No results found for:  COLORURINE, APPEARANCEUR, LABSPEC, PHURINE, GLUCOSEU, HGBUR, BILIRUBINUR, KETONESUR, PROTEINUR, UROBILINOGEN, NITRITE, LEUKOCYTESUR  RADIOGRAPHIC STUDIES: No results found.  ASSESSMENT:  #1.IgG lambda multiple myeloma status post 4 cycles of dexamethasone and Velcade followed by bone marrow transplantation by Dr. Marcell Parrish at [facility] October 2011. Now on maintenance Revlimid which he is tolerating well, no evidence of disease..  2. Non-Hodgkin's lymphoma, stage IVb, low-grade B-cell type, symptomatic at presentation in July 2002 to 6 cycles of R. CHOP with a complete response and PET scan in June 2012 showed no evidence for recurrent disease .  #3. Hypogammaglobulinemia, now on bi-monthly IV IgG 35 g  #4. 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  #5. Coronary artery disease, status post stent placement x5 in the past, status post pacemaker for heart block.  #6. Status post left pinna tumor resection.  #6. Chronic obstructive pulmonary disease, no longer smoking.  #7. Osteoporosis, on treatment with Zometa, calcium, and vitamin D.  #8. Cancer involving the right ear, status post excision, no evidence of recurrence, surgery by Dr. Link Snuffer.  #9. Episode of cold vesicular fistula, status post repair with no evidence of recurrence.  #10. History of bladder cancer, status post TURBT.    PLAN:  #1. IV IgG 35 g intravenously. #2. CBC monthly. #3. Revlimid 10 mg daily for 7 days on and 7 days off. #4. Aspirin 81 mg daily. #5. Discontinue Zometa. #6. Follow-up in 2 months for additional IgG at which time CBC, chem profile, immunofixation, myeloma panel, and light chains will be done.   All questions were answered. The patient knows to call the clinic with any problems, questions or concerns. We  can certainly see the patient much sooner if necessary.   I spent 25 minutes counseling the patient face to face. The total time spent  in the appointment was 30 minutes.    Doroteo Bradford, MD 10/14/2014 10:16 AM  DISCLAIMER:  This note was dictated with voice recognition software.  Similar sounding words can inadvertently be transcribed inaccurately and may not be corrected upon review.

## 2014-10-14 NOTE — Progress Notes (Signed)
Labs for sifx,b47m,kllc,ldh,cbcd,cmp,mm

## 2014-10-14 NOTE — Progress Notes (Signed)
CRITICAL VALUE ALERT Critical value received:  Potassium 2.9 Date of notification:  10/14/2014 Time of notification: 0930 Critical value read back:  Yes.   Nurse who received aler:  Madaline Brilliant MD notified (1st page):  [phone] Dr Barnet Glasgow notified

## 2014-10-14 NOTE — Patient Instructions (Signed)
[place] [facility] Discharge Instructions  RECOMMENDATIONS MADE BY THE CONSULTANT AND ANY TEST RESULTS WILL BE SENT TO YOUR REFERRING PHYSICIAN.  EXAM FINDINGS BY THE PHYSICIAN TODAY AND SIGNS OR SYMPTOMS TO REPORT TO CLINIC OR PRIMARY PHYSICIAN: Exam and findings as discussed by Dr.Formanek.Marland Kitchen  MEDICATIONS PRESCRIBED:  IVIG infusion today.  INSTRUCTIONS/FOLLOW-UP: CBC monthly. IVIG infusion every 2 months. MD appointment with all lab work in 2 months as well.  Thank you for choosing [facility] to provide your oncology and hematology care.  To afford each patient quality time with our providers, please arrive at least 15 minutes before your scheduled appointment time.  With your help, our goal is to use those 15 minutes to complete the necessary work-up to ensure our physicians have the information they need to help with your evaluation and healthcare recommendations.    Effective January 1st, 2014, we ask that you re-schedule your appointment with our physicians should you arrive 10 or more minutes late for your appointment.  We strive to give you quality time with our providers, and arriving late affects you and other patients whose appointments are after yours.    Again, thank you for choosing [facility].  Our hope is that these requests will decrease the amount of time that you wait before being seen by our physicians.       _____________________________________________________________  Should you have questions after your visit to [facility], please contact our office at [phone] between the hours of 8:30 a.m. and 4:30 p.m.  Voicemails left after 4:30 p.m. will not be returned until the following business day.  For prescription refill requests, have your pharmacy contact our office with your prescription refill request.    _______________________________________________________________  We hope that we have given you very  good care.  You may receive a patient satisfaction survey in the mail, please complete it and return it as soon as possible.  We value your feedback!  _______________________________________________________________  Have you asked about our STAR program?  STAR stands for Survivorship Training and Rehabilitation, and this is a nationally recognized cancer care program that focuses on survivorship and rehabilitation.  Cancer and cancer treatments may cause problems, such as, pain, making you feel tired and keeping you from doing the things that you need or want to do. Cancer rehabilitation can help. Our goal is to reduce these troubling effects and help you have the best quality of life possible.  You may receive a survey from a nurse that asks questions about your current state of health.  Based on the survey results, all eligible patients will be referred to the [facility] program for an evaluation so we can better serve you!  A frequently asked questions sheet is available upon request.

## 2014-10-17 ENCOUNTER — Telehealth: Payer: Self-pay | Admitting: Nutrition

## 2014-10-17 LAB — KAPPA/LAMBDA LIGHT CHAINS
Kappa free light chain: 1.84 mg/dL (ref 0.33–1.94)
Kappa, lambda light chain ratio: 1.1 (ref 0.26–1.65)
Lambda free light chains: 1.67 mg/dL (ref 0.57–2.63)

## 2014-10-17 LAB — BETA 2 MICROGLOBULIN, SERUM: Beta-2 Microglobulin: 2.3 mg/L — ABNORMAL HIGH (ref <=2.51)

## 2014-10-17 NOTE — Telephone Encounter (Signed)
Patient was positive for a risk for malnutrition on the MST screening tool secondary to weight loss and poor appetite. Contacted patient by phone and left a message for him to call me if I can offer Nutrition assistance. Left my name and phone number for patient.

## 2014-10-18 LAB — MULTIPLE MYELOMA PANEL, SERUM
ALPHA-2-GLOBULIN: 13 % — AB (ref 7.1–11.8)
Albumin ELP: 56.5 % (ref 55.8–66.1)
Alpha-1-Globulin: 5.9 % — ABNORMAL HIGH (ref 2.9–4.9)
Beta 2: 5.6 % (ref 3.2–6.5)
Beta Globulin: 6.5 % (ref 4.7–7.2)
Gamma Globulin: 12.5 % (ref 11.1–18.8)
IGA: 239 mg/dL (ref 68–379)
IGG (IMMUNOGLOBIN G), SERUM: 733 mg/dL (ref 650–1600)
IgM, Serum: 25 mg/dL — ABNORMAL LOW (ref 41–251)
M-SPIKE, %: NOT DETECTED g/dL
TOTAL PROTEIN: 6.6 g/dL (ref 6.0–8.3)

## 2014-10-18 LAB — IMMUNOFIXATION ELECTROPHORESIS
IGM, SERUM: 27 mg/dL — AB (ref 41–251)
IgA: 227 mg/dL (ref 68–379)
IgG (Immunoglobin G), Serum: 688 mg/dL — ABNORMAL LOW (ref 650–1600)
Total Protein ELP: 6.3 g/dL (ref 6.0–8.3)

## 2014-10-19 ENCOUNTER — Encounter (HOSPITAL_BASED_OUTPATIENT_CLINIC_OR_DEPARTMENT_OTHER): Payer: Medicare Other

## 2014-10-19 ENCOUNTER — Other Ambulatory Visit (HOSPITAL_COMMUNITY): Payer: Self-pay | Admitting: Oncology

## 2014-10-19 DIAGNOSIS — C9000 Multiple myeloma not having achieved remission: Secondary | ICD-10-CM

## 2014-10-19 MED ORDER — LENALIDOMIDE 10 MG PO CAPS: 10.0000 mg | ORAL_CAPSULE | Freq: Every day | ORAL | Status: DC

## 2014-10-24 ENCOUNTER — Ambulatory Visit (INDEPENDENT_AMBULATORY_CARE_PROVIDER_SITE_OTHER): Payer: Medicare Other | Admitting: Family Medicine

## 2014-10-24 ENCOUNTER — Telehealth: Payer: Self-pay | Admitting: Cardiovascular Disease

## 2014-10-24 ENCOUNTER — Encounter: Payer: Self-pay | Admitting: Family Medicine

## 2014-10-24 ENCOUNTER — Telehealth: Payer: Self-pay | Admitting: Family Medicine

## 2014-10-24 VITALS — BP 136/80 | Ht 68.0 in | Wt 180.0 lb

## 2014-10-24 DIAGNOSIS — R110 Nausea: Secondary | ICD-10-CM | POA: Diagnosis not present

## 2014-10-24 DIAGNOSIS — I2510 Atherosclerotic heart disease of native coronary artery without angina pectoris: Secondary | ICD-10-CM

## 2014-10-24 MED ORDER — ONDANSETRON 4 MG PO TBDP
4.0000 mg | ORAL_TABLET | Freq: Three times a day (TID) | ORAL | Status: DC | PRN
Start: 2014-10-24 — End: 2014-10-25

## 2014-10-24 NOTE — Telephone Encounter (Signed)
Returned call to patient. He reports he cannot do device transmission from how - that it does not work. He states at his last OV he brought his home transmitter in and was told he could maybe get a new one, but never did. He is scheduled to see Dr. Claiborne Billings tomorrow @ 4pm. I called Tomi Bamberger, [employer] Rep, to see if someone could come check patient, as last device transmission in [facility] was from 09/2013. He will be remote transmitter with him to appmt as well. She will try to have someone come check patient. Will defer to [place] clinic to see if there is any further advice that can be given to patient about how to check his device from home.

## 2014-10-24 NOTE — Telephone Encounter (Signed)
Patient states that medication cost $117 and he can't afford it. Can a cheaper alternative be prescribed?

## 2014-10-24 NOTE — Telephone Encounter (Signed)
i wrote but didn't sign, now done

## 2014-10-24 NOTE — Progress Notes (Signed)
   Subjective:    Patient ID: Jeremy Parrish, male    DOB: 04/18/1941, 73 y.o.   MRN: [id number]  HPInausea off and on for the past couple of weeks. Pt states he has taken a pill for nausea and is unsure of name. He states he had it when he was taking chemo. Pt states the med helped.   Pt wants to know if he needs any bloodwork done.   No major fever no true vomiting. No headache no chest pain Review of Systems    no significant abdominal pain no rash no dysuria no diarrhea Objective:   Physical Exam Alert hydration good. Talkative no acute distress HEENT normal. Lungs clear. Heart regular in rhythm. Abdomen benign       Assessment & Plan:  Impression nausea very nonspecific and this point plan trial of oh dance to try before any further major workup. Rationale discussed with patient and he is willing. WSL recheck if persists

## 2014-10-24 NOTE — Telephone Encounter (Signed)
°

## 2014-10-24 NOTE — Telephone Encounter (Signed)
Patient said he was supposed to have a medication called in this morning, but pharmacy never received it. Please advise.   [facility] [place]

## 2014-10-24 NOTE — Telephone Encounter (Signed)
I called pt to trouble shoot his Carelink monitor. Pt has appt tomorrow, 10/25/14, w/ Dr. Claiborne Billings. Pt prefers to discuss home remotes at appt then. I let pt know it's not an appt w/ Dr. Sallyanne Kuster. Pt prefers to wait until tomorrow to further discuss.

## 2014-10-25 ENCOUNTER — Ambulatory Visit: Payer: Medicare Other | Admitting: *Deleted

## 2014-10-25 ENCOUNTER — Ambulatory Visit (INDEPENDENT_AMBULATORY_CARE_PROVIDER_SITE_OTHER): Payer: Medicare Other | Admitting: Cardiovascular Disease

## 2014-10-25 ENCOUNTER — Encounter: Payer: Self-pay | Admitting: Cardiovascular Disease

## 2014-10-25 VITALS — BP 128/86 | HR 74 | Ht 68.0 in | Wt 182.6 lb

## 2014-10-25 DIAGNOSIS — G4733 Obstructive sleep apnea (adult) (pediatric): Secondary | ICD-10-CM

## 2014-10-25 DIAGNOSIS — I10 Essential (primary) hypertension: Secondary | ICD-10-CM | POA: Diagnosis not present

## 2014-10-25 DIAGNOSIS — I2510 Atherosclerotic heart disease of native coronary artery without angina pectoris: Secondary | ICD-10-CM

## 2014-10-25 DIAGNOSIS — Z950 Presence of cardiac pacemaker: Secondary | ICD-10-CM

## 2014-10-25 DIAGNOSIS — Z8679 Personal history of other diseases of the circulatory system: Secondary | ICD-10-CM | POA: Diagnosis not present

## 2014-10-25 MED ORDER — ONDANSETRON HCL 4 MG PO TABS: 4.0000 mg | ORAL_TABLET | Freq: Three times a day (TID) | ORAL | Status: DC | PRN

## 2014-10-25 NOTE — Patient Instructions (Signed)
Your physician recommends that you schedule a follow-up appointment in:2-3 months in a sleep clinic. 

## 2014-10-25 NOTE — Telephone Encounter (Signed)
Change it to reg tabs, nondissovable

## 2014-10-25 NOTE — Telephone Encounter (Signed)
Left message on voicemail notifying patient that medication has been sent to pharmacy.

## 2014-10-26 ENCOUNTER — Encounter (HOSPITAL_COMMUNITY): Payer: Self-pay | Admitting: *Deleted

## 2014-10-26 ENCOUNTER — Emergency Department (HOSPITAL_COMMUNITY)
Admission: EM | Admit: 2014-10-26 | Discharge: 2014-10-26 | Disposition: A | Discharge status: Home / Self Care | Payer: Medicare Other | Attending: Emergency Medicine | Admitting: Emergency Medicine

## 2014-10-26 DIAGNOSIS — Z87442 Personal history of urinary calculi: Secondary | ICD-10-CM | POA: Insufficient documentation

## 2014-10-26 DIAGNOSIS — Z7902 Long term (current) use of antithrombotics/antiplatelets: Secondary | ICD-10-CM | POA: Insufficient documentation

## 2014-10-26 DIAGNOSIS — R011 Cardiac murmur, unspecified: Secondary | ICD-10-CM | POA: Diagnosis not present

## 2014-10-26 DIAGNOSIS — Z8551 Personal history of malignant neoplasm of bladder: Secondary | ICD-10-CM | POA: Diagnosis not present

## 2014-10-26 DIAGNOSIS — Z862 Personal history of diseases of the blood and blood-forming organs and certain disorders involving the immune mechanism: Secondary | ICD-10-CM | POA: Diagnosis not present

## 2014-10-26 DIAGNOSIS — M436 Torticollis: Secondary | ICD-10-CM | POA: Diagnosis not present

## 2014-10-26 DIAGNOSIS — Z87448 Personal history of other diseases of urinary system: Secondary | ICD-10-CM | POA: Insufficient documentation

## 2014-10-26 DIAGNOSIS — J449 Chronic obstructive pulmonary disease, unspecified: Secondary | ICD-10-CM | POA: Insufficient documentation

## 2014-10-26 DIAGNOSIS — Z87891 Personal history of nicotine dependence: Secondary | ICD-10-CM | POA: Insufficient documentation

## 2014-10-26 DIAGNOSIS — Z8546 Personal history of malignant neoplasm of prostate: Secondary | ICD-10-CM | POA: Insufficient documentation

## 2014-10-26 DIAGNOSIS — Z8572 Personal history of non-Hodgkin lymphomas: Secondary | ICD-10-CM | POA: Diagnosis not present

## 2014-10-26 DIAGNOSIS — K219 Gastro-esophageal reflux disease without esophagitis: Secondary | ICD-10-CM | POA: Insufficient documentation

## 2014-10-26 DIAGNOSIS — I252 Old myocardial infarction: Secondary | ICD-10-CM | POA: Insufficient documentation

## 2014-10-26 DIAGNOSIS — Z79899 Other long term (current) drug therapy: Secondary | ICD-10-CM | POA: Insufficient documentation

## 2014-10-26 DIAGNOSIS — I509 Heart failure, unspecified: Secondary | ICD-10-CM | POA: Diagnosis not present

## 2014-10-26 DIAGNOSIS — I2510 Atherosclerotic heart disease of native coronary artery without angina pectoris: Secondary | ICD-10-CM | POA: Insufficient documentation

## 2014-10-26 DIAGNOSIS — I739 Peripheral vascular disease, unspecified: Secondary | ICD-10-CM | POA: Diagnosis not present

## 2014-10-26 DIAGNOSIS — I10 Essential (primary) hypertension: Secondary | ICD-10-CM | POA: Insufficient documentation

## 2014-10-26 DIAGNOSIS — Z8673 Personal history of transient ischemic attack (TIA), and cerebral infarction without residual deficits: Secondary | ICD-10-CM | POA: Diagnosis not present

## 2014-10-26 DIAGNOSIS — E785 Hyperlipidemia, unspecified: Secondary | ICD-10-CM | POA: Insufficient documentation

## 2014-10-26 DIAGNOSIS — M1990 Unspecified osteoarthritis, unspecified site: Secondary | ICD-10-CM | POA: Diagnosis not present

## 2014-10-26 DIAGNOSIS — F329 Major depressive disorder, single episode, unspecified: Secondary | ICD-10-CM | POA: Insufficient documentation

## 2014-10-26 DIAGNOSIS — M542 Cervicalgia: Secondary | ICD-10-CM | POA: Diagnosis present

## 2014-10-26 DIAGNOSIS — Z7982 Long term (current) use of aspirin: Secondary | ICD-10-CM | POA: Insufficient documentation

## 2014-10-26 DIAGNOSIS — Z8579 Personal history of other malignant neoplasms of lymphoid, hematopoietic and related tissues: Secondary | ICD-10-CM | POA: Diagnosis not present

## 2014-10-26 MED ORDER — IBUPROFEN 800 MG PO TABS: 800.0000 mg | ORAL_TABLET | Freq: Three times a day (TID) | ORAL | Status: AC

## 2014-10-26 MED ORDER — DIAZEPAM 5 MG PO TABS
5.0000 mg | ORAL_TABLET | Freq: Once | ORAL | Status: AC
  Administered 2014-10-26: 5 mg via ORAL
  Filled 2014-10-26: qty 1

## 2014-10-26 MED ORDER — KETOROLAC TROMETHAMINE 30 MG/ML IJ SOLN
30.0000 mg | Freq: Once | INTRAMUSCULAR | Status: AC
  Administered 2014-10-26: 30 mg via INTRAMUSCULAR
  Filled 2014-10-26: qty 1

## 2014-10-26 MED ORDER — DIAZEPAM 5 MG PO TABS: 5.0000 mg | ORAL_TABLET | Freq: Two times a day (BID) | ORAL | Status: AC

## 2014-10-26 MED ORDER — DEXAMETHASONE SODIUM PHOSPHATE 10 MG/ML IJ SOLN
10.0000 mg | Freq: Once | INTRAMUSCULAR | Status: AC
  Administered 2014-10-26: 10 mg via INTRAMUSCULAR
  Filled 2014-10-26: qty 1

## 2014-10-26 NOTE — ED Provider Notes (Signed)
CSN: [id number]     Arrival date & time 10/26/14  1605 History   First MD Initiated Contact with Patient 10/26/14 1619     Chief Complaint  Patient presents with  . Neck Pain     (Consider location/radiation/quality/duration/timing/severity/associated sxs/prior Treatment) HPI Patient presents with neck pain. Patient awoke yesterday, approximately 36 hours ago with pain focally about the right lateral neck base.  Pain is sore, severe, worse with head rotation. No relief with home narcotics. No weakness or dysesthesia in the right arm, no headache, confusion, disorientation, visual loss.  Past Medical History  Diagnosis Date  . Hypertension   . Heart disease   . Kidney stones     history  . Lung mass   . Heart murmur   . 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 Coronary artery disease   . Depression   . Shortness of breath   . Peripheral arterial disease   . Bladder neck contracture   . Personal history of other diseases of circulatory system   . Aortic aneurysm of unspecified site without mention of rupture   . Arthritis   . Intestinovesical fistula   . Esophageal reflux   . Hyperlipidemia   . Anemia   . CHF (congestive heart failure)   . COPD (chronic obstructive pulmonary disease)   . Cerebral atherosclerosis     Carotid Doppler, 02/16/2013 - Bilateral Proximal ICAs,demonstrate mild plaque w/o evidence of significant diameter reduction, dissection, or any other vascular abnormality  . Complication of anesthesia   . PONV (postoperative nausea and vomiting)   . Stroke 2013    Speech.  . Hx of bladder cancer 10/07/2012  . Sleep apnea     05-02-14 cpap , not yet used- suggested settings 5  . Cancer   . Prostate cancer 2000  . Multiple myeloma   . Non Hodgkin's lymphoma   . Myocardial infarction     '96   Past Surgical History  Procedure Laterality Date  . Prostate surgery    . Heart stents x 5  1999    . Portacath placement  07/26/2009    right chest  . Wrist surgery      right  . Left ear skin cancer removed    . Bone marrow transplant  2011  . Pacemaker insertion  07/22/2011    [employer]tr[employer]  . Coronary angioplasty  06/24/2000    PCI and stenting in mid & proximal RCA  . Insert / replace / remove pacemaker    . Tee without cardioversion  10/13/2012    Procedure: TRANSESOPHAGEAL ECHOCARDIOGRAM (TEE);  Surgeon: Sanda Klein, MD;  Location: [facility] ENDOSCOPY;  Service: Cardiovascular;  Laterality: N/A;  pat/kay/echo notified  . Colonoscopy N/A 01/01/2013    Procedure: COLONOSCOPY;  Surgeon: Rogene Houston, MD;  Location: [facility] ENDO SU[facility];  Service: Endoscopy;  Laterality: N/A;  825-moved to [place] notified pt  . Rotator    . Rotator cuff repair Right   . Colon surgery      colon resection  . Bladder surgery    . Shoulder arthroscopy with subacromial decompression Right 07/21/2013    Procedure: RIGHT SHOULDER ARTHROSCOPY WITH SUBACROMIAL DECOMPRESSION AND DEBRIDEMENT & Injection of Left Shoulder;  Surgeon: Alta Corning, MD;  Location: [facility];  Service: Orthopedics;  Laterality: Right;  . US echocardiography  06/19/2011    RV mildly dilated,mild to mod. MR,mild AI,mild PI  . Nm myocar perf wall motion  11/27/2007    inferior scar  . Inguinal hernia repair Right 05/04/2014    Procedure: OPEN RIGHT INGUINAL HERNIA REPAIR with mesh;  Surgeon: Edward Jolly, MD;  Location: [facility] ORS;  Service: General;  Laterality: Right;   Family History  Problem Relation Age of Onset  . Colon cancer Neg Hx   . Colon polyps Neg Hx   . Cancer Father     bladder  . Heart disease Father     before age 32  . Hypertension Mother   . Cancer Brother   . Heart disease Brother     before age 42  . Heart disease Sister     before age 79  . Hyperlipidemia Sister   . Hypertension Sister   . Heart attack Sister    History  Substance Use Topics  . Smoking status: Former Smoker -- 1.00 packs/day for 20 years     Types: Cigarettes    Quit date: 11/14/1994  . Smokeless tobacco: Never Used  . Alcohol Use: No     Comment: previously drank but none for at least 15 years.    Review of Systems  Constitutional:       Per HPI, otherwise negative  HENT:       Per HPI, otherwise negative  Respiratory:       Per HPI, otherwise negative  Cardiovascular:       Per HPI, otherwise negative  Gastrointestinal: Negative for vomiting.  Endocrine:       Negative aside from HPI  Genitourinary:       Neg aside from HPI   Musculoskeletal:       Per HPI, otherwise negative  Skin: Negative.   Neurological: Negative for syncope.      Allergies  Diphenhydramine hcl; Morphine and related; and Tape  Home Medications   Prior to Admission medications   Medication Sig Start Date End Date Taking? Authorizing Provider  ALPRAZolam Duanne Moron) 0.5 MG tablet Take 1 tablet (0.5 mg total) by mouth at bedtime as needed for anxiety. 09/19/14  Yes Baird Cancer, PA-C  amLODipine (NORVASC) 10 MG tablet Take 1 tablet (10 mg total) by mouth every morning. 06/07/14  Yes Lorretta Harp, MD  aspirin EC 81 MG tablet Take 81 mg by mouth at bedtime.   Yes Historical Provider, MD  citalopram (CELEXA) 40 MG tablet TAKE 1 TABLET BY MOUTH EVERY DAY 09/19/14  Yes Mikey Kirschner, MD  clopidogrel (PLAVIX) 75 MG tablet Take 75 mg by mouth every morning.   Yes Historical Provider, MD  omeprazole (PRILOSEC) 20 MG capsule Take 1 capsule (20 mg total) by mouth daily. 09/20/14  Yes Mikey Kirschner, MD  oxyCODONE-acetaminophen (PERCOCET/ROXICET) 5-325 MG per tablet Take 1 tablet by mouth every 8 (eight) hours as needed for severe pain. 09/19/14  Yes Manon Hilding Kefalas, PA-C  simvastatin (ZOCOR) 20 MG tablet Take 1 tablet (20 mg total) by mouth at bedtime. 05/16/14  Yes Lorretta Harp, MD  ibuprofen (ADVIL,MOTRIN) 200 MG tablet Take 200 mg by mouth every 6 (six) hours as needed (Pain).    Historical Provider, MD  lenalidomide (REVLIMID) 10 MG  capsule Take 1 capsule (10 mg total) by mouth daily. Take 1 capsule by mouth daily for 7 days on followed by 7 days off 10/19/14   Baird Cancer, PA-C  nitroGLYCERIN (NITROSTAT) 0.4 MG SL tablet Place 1 tablet (0.4 mg total) under the tongue every 5 (five) minutes as needed for chest pain. 04/02/13  Kathyrn Drown, MD  ondansetron (ZOFRAN) 4 MG tablet Take 1 tablet (4 mg total) by mouth every 8 (eight) hours as needed for nausea or vomiting. 10/25/14   Mikey Kirschner, MD  traMADol (ULTRAM) 50 MG tablet Take 1 tablet by mouth every 8 (eight) hours as needed. 10/14/14   Historical Provider, MD  zolendronic acid (ZOMETA) 4 MG/5ML injection Inject 4 mg into the vein once.    Historical Provider, MD   BP 139/70 mmHg  Pulse 74  Temp(Src) 99.9 F (37.7 C) (Oral)  Resp 20  Ht $R'5\' 8"'pP$  (1.727 m)  Wt 180 lb (81.647 kg)  BMI 27.38 kg/m2  SpO2 95% Physical Exam  Constitutional: He is oriented to person, place, and time. He appears well-developed. No distress.  HENT:  Head: Normocephalic and atraumatic.  Eyes: Conjunctivae and EOM are normal.  Neck:    Cardiovascular: Normal rate and regular rhythm.   Pulmonary/Chest: Effort normal. No stridor. No respiratory distress.  Abdominal: He exhibits no distension.  Musculoskeletal: He exhibits no edema.       Right shoulder: Normal.  Neurological: He is alert and oriented to person, place, and time. He displays no atrophy and no tremor. No sensory deficit. He exhibits normal muscle tone. He displays no seizure activity.  Skin: Skin is warm and dry.  Psychiatric: He has a normal mood and affect.  Nursing note and vitals reviewed.   ED Course  Procedures (including critical care time)  7:09 PM Patient substantially better, sitting upright, standing, moving his arm freely, no neck pain. I discussed all findings, thoughts, return precautions, follow-up instructions with the patient and family members. MDM  Patient presents with new neck pain, has  findings consistent with torticollis, no neurologic deficiency, nor evidence for vascular compromise. Patient presents substantially here with medication cocktail.   Carmin Muskrat, MD 10/26/14 [phone]

## 2014-10-26 NOTE — ED Notes (Addendum)
Pt states he woke up yesterday with severe pain to right side of neck and hurts to turn his neck.

## 2014-10-26 NOTE — Discharge Instructions (Signed)
As discussed, it is important that you follow up as soon as possible with your physician for continued management of your condition. ° °If you develop any new, or concerning changes in your condition, please return to the emergency department immediately. ° °

## 2014-10-27 ENCOUNTER — Encounter: Payer: Self-pay | Admitting: Cardiovascular Disease

## 2014-10-27 NOTE — Progress Notes (Signed)
Patient ID: Jeremy Parrish, male   DOB: 1940-12-14, 73 y.o.   MRN: [id number]     HPI: Jeremy Parrish is a 73 y.o. male who is followed by Dr. Gwenlyn Found for his cardiology care and Dr.Croitoru for his pacemaker.  I had seen him in September 2015 in a sleep clinic following initiation of CPAP therapy.  [name] has a history of established  CAD with prior stenting to his RCA, circumflex vessel, and with his most recent Myoview study in January 2015 revealing lateral scar with an ejection fraction of 35-40%.  He is status post a permanent pacemaker and has a history of hypertension, as well as hyperlipidemia.  He was referred for a polysomnogram, which was done in [place] at [facility]  This was done in a split-night protocol and demonstrated moderate sleep apnea with an AHI of 21.8.  His RDI was 25.8.  On CPAP titration .  He was very sensitive to pressure.  He was only titrated between 4-7 cm due to development of central events.  Apparently, there was a short trial of BiPAP at 8/4 he was quickly changed back to CPAP.  He has an AirSense 10 AutoSet unit and he was set at a minimum pressure of 6 and maximum pressure of 20.  When I saw him in September.  A download revealed a maximum average pressure of 18.8 cm in his 95th percentile pressure was 17.5..  He's been using a Fisher Paykel Simplus fullface mask, medium.  A download was obtained from 06/14/2014 through 07/31/2014.  He did meet Medicare compliance standards with usage days at 93%.  There was 70% of the time when usage was greater than 4 hours and he was averaging 5 hours and 30 minutes of use.  His AHI was increased at 18.6.  His apnea index was 16.2, and central apnea index 9.6, with obstructive index of 5.8  He still notes hypersomnolence.  He is unaware of breakthrough snoring.  An Epworth scale was calculated today, and this is compatible with continued excessive daytime sleepiness as noted below   Epworth Sleepiness Scale: In  September was as below Situation   Chance of Dozing/Sleeping (0 = never , 1 = slight chance , 2 = moderate chance , 3 = high chance )   sitting and reading 3   watching TV 3   sitting inactive in a public place 3   being a passenger in a motor vehicle for an hour or more 2   lying down in the afternoon 3   sitting and talking to someone 0   sitting quietly after lunch (no alcohol) 2   while stopped for a few minutes in traffic as the driver 0   Total Score  16   With CPAP therapy, he was having central apneas with an index of 9.6 on therapy.  As result, I recommended he undergo a BiPAP titration.  His BiP[facility] titration was done [facility] on 08/29/2014.  He ultimately was titrated up to 15/10 water pressure.  At pressures above 11/7 he developed increased frequency of central apneic events.  The AHI at 11/7 was 2.3 with an oxygen nadir of 91% and with only 1 central apneic event.  Central apneas were significantly higher at a 12/8 pressure.  As result, he was recommended to initiate therapy with BiPAP with by Flex/APR 3 at 11/7 water pressure.  He was scheduled to see me for follow-up.  Apparently, the patient has  still not yet received his BiPAP machine from his [employer]  company and is scheduled to receive it this week.  While he was in the office, since he was due for his [employer] pacemaker check this was done electronically.   Past Medical History  Diagnosis Date  . Hypertension   . Heart disease   . Kidney stones     history  . Lung mass   . Heart murmur   . 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 Coronary artery disease   . Depression   . Shortness of breath   . Peripheral arterial disease   . Bladder neck contracture   . Personal history of other diseases of circulatory system   . Aortic aneurysm of unspecified site without mention of rupture   . Arthritis   . Intestinovesical fistula   . Esophageal reflux    . Hyperlipidemia   . Anemia   . CHF (congestive heart failure)   . COPD (chronic obstructive pulmonary disease)   . Cerebral atherosclerosis     Carotid Doppler, 02/16/2013 - Bilateral Proximal ICAs,demonstrate mild plaque w/o evidence of significant diameter reduction, dissection, or any other vascular abnormality  . Complication of anesthesia   . PONV (postoperative nausea and vomiting)   . Stroke 2013    Speech.  . Hx of bladder cancer 10/07/2012  . Sleep apnea     05-02-14 cpap , not yet used- suggested settings 5  . Cancer   . Prostate cancer 2000  . Multiple myeloma   . Non Hodgkin's lymphoma   . Myocardial infarction     '96    Past Surgical History  Procedure Laterality Date  . Prostate surgery    . Heart stents x 5  1999  . Portacath placement  07/26/2009    right chest  . Wrist surgery      right  . Left ear skin cancer removed    . Bone marrow transplant  2011  . Pacemaker insertion  07/22/2011    [employer]  . Coronary angioplasty  06/24/2000    PCI and stenting in mid & proximal RCA  . Insert / replace / remove pacemaker    . Tee without cardioversion  10/13/2012    Procedure: TRANSESOPHAGEAL ECHOCARDIOGRAM (TEE);  Surgeon: Sanda Klein, MD;  Location: [facility] ENDOSCOPY;  Service: Cardiovascular;  Laterality: N/A;  pat/kay/echo notified  . Colonoscopy N/A 01/01/2013    Procedure: COLONOSCOPY;  Surgeon: Rogene Houston, MD;  Location: [facility];  Service: Endoscopy;  Laterality: N/A;  825-moved to [place] notified pt  . Rotator    . Rotator cuff repair Right   . Colon surgery      colon resection  . Bladder surgery    . Shoulder arthroscopy with subacromial decompression Right 07/21/2013    Procedure: RIGHT SHOULDER ARTHROSCOPY WITH SUBACROMIAL DECOMPRESSION AND DEBRIDEMENT & Injection of Left Shoulder;  Surgeon: Alta Corning, MD;  Location: [place];  Service: Orthopedics;  Laterality: Right;  . US echocardiography  06/19/2011    RV mildly dilated,mild to mod.  MR,mild AI,mild PI  . Nm myocar perf wall motion  11/27/2007    inferior scar  . Inguinal hernia repair Right 05/04/2014    Procedure: OPEN RIGHT INGUINAL HERNIA REPAIR with mesh;  Surgeon: Edward Jolly, MD;  Location: [facility] OR[facility];  Service: General;  Laterality: Right;    Allergies  Allergen Reactions  . Diphenhydramine Hcl  REACTION: "hyper"  . Morphine And Related Other (See Comments)    hallucinations  . Tape Rash    Paper tape is ok    Current Outpatient Prescriptions  Medication Sig Dispense Refill  . ALPRAZolam (XANAX) 0.5 MG tablet Take 1 tablet (0.5 mg total) by mouth at bedtime as needed for anxiety. 90 tablet 1  . amLODipine (NORVASC) 10 MG tablet Take 1 tablet (10 mg total) by mouth every morning. 90 tablet 2  . aspirin EC 81 MG tablet Take 81 mg by mouth at bedtime.    . citalopram (CELEXA) 40 MG tablet TAKE 1 TABLET BY MOUTH EVERY DAY 45 tablet 0  . clopidogrel (PLAVIX) 75 MG tablet Take 75 mg by mouth every morning.    Marland Kitchen lenalidomide (REVLIMID) 10 MG capsule Take 1 capsule (10 mg total) by mouth daily. Take 1 capsule by mouth daily for 7 days on followed by 7 days off 14 capsule 0  . nitroGLYCERIN (NITROSTAT) 0.4 MG SL tablet Place 1 tablet (0.4 mg total) under the tongue every 5 (five) minutes as needed for chest pain. 25 tablet 12  . omeprazole (PRILOSEC) 20 MG capsule Take 1 capsule (20 mg total) by mouth daily. 90 capsule 1  . ondansetron (ZOFRAN) 4 MG tablet Take 1 tablet (4 mg total) by mouth every 8 (eight) hours as needed for nausea or vomiting. 30 tablet 2  . oxyCODONE-acetaminophen (PERCOCET/ROXICET) 5-325 MG per tablet Take 1 tablet by mouth every 8 (eight) hours as needed for severe pain. 90 tablet 0  . simvastatin (ZOCOR) 20 MG tablet Take 1 tablet (20 mg total) by mouth at bedtime. 90 tablet 2  . zolendronic acid (ZOMETA) 4 MG/5ML injection Inject 4 mg into the vein once.    . diazepam (VALIUM) 5 MG tablet Take 1 tablet (5 mg total) by mouth 2 (two)  times daily. 7 tablet 0  . ibuprofen (ADVIL,MOTRIN) 800 MG tablet Take 1 tablet (800 mg total) by mouth 3 (three) times daily. 10 tablet 0  . traMADol (ULTRAM) 50 MG tablet Take 1 tablet by mouth every 8 (eight) hours as needed.     No current facility-administered medications for this visit.    History   Social History  . Marital Status: Married    Spouse Name: [name]    Number of Children: 3  . Years of Education: 9th   Occupational History  . retired     Social History Main Topics  . Smoking status: Former Smoker -- 1.00 packs/day for 20 years    Types: Cigarettes    Quit date: 11/14/1994  . Smokeless tobacco: Never Used  . Alcohol Use: No     Comment: previously drank but none for at least 15 years.  . Drug Use: No  . Sexual Activity: Not on file   Other Topics Concern  . Not on file   Social History Narrative   Patient lives at home spouse.   Caffeine Use: Occasionally     ROS General: Negative; No fevers, chills, or night sweats HEENT: Negative; No changes in vision or hearing, sinus congestion, difficulty swallowing Pulmonary: Negative; No cough, wheezing, shortness of breath, hemoptysis Cardiovascular: Negative; No chest pain, presyncope, syncope, palpatations GI: Positive for GERD; No nausea, vomiting, diarrhea, or abdominal pain GU: Negative; No dysuria, hematuria, or difficulty voiding Musculoskeletal: Negative; no myalgias, joint pain, or weakness Hematologic: Negative; no easy bruising, bleeding Endocrine: Negative; no heat/cold intolerance Neuro: Negative; no changes in balance, headaches Skin: Negative; No rashes  or skin lesions Psychiatric: Negative; No behavioral problems, depression Sleep: Positive for obstructive sleep apnea with daytime sleepiness, hypersomnolence,  nobruxism, restless legs, hypnogognic hallucinations, no cataplexy   Physical Exam BP 128/86 mmHg  Pulse 74  Ht $R'5\' 8"'Gi$  (1.727 m)  Wt 182 lb 9.6 oz (82.827 kg)  BMI 27.77 kg/m2    General: Alert, oriented, no distress.  Skin: normal turgor, no rashes HEENT: Normocephalic, atraumatic. Pupils round and reactive; sclera anicteric; extraocular muscles intact; Fundi with mild arteriolar narrowing without hemorrhages or exudates Nose without nasal septal hypertrophy Mouth/Parynx benign; Mallinpatti scale 3  Neck: No JVD, no carotid briuts Lungs: clear to ausculatation and percussion; no wheezing or rales  Chest wall: No tenderness to palpation Heart: RRR, s1 s2 normal 1/6 systolic murmur.  No S3 gallop.  No diastolic murmur. Abdomen: soft, nontender; no hepatosplenomehaly, BS+; abdominal aorta nontender and not dilated by palpation. Back: No CVA tenderness Pulses 2+ Extremities: no clubbinbg cyanosis or edema, Homan's sign negative  Neurologic: grossly nonfocal; cranial nerves intact. Psychological: Normal affect and mood.   LABS:  BMET    Component Value Date/Time   NA 142 10/14/2014 0857   K 2.9* 10/14/2014 0857   CL 103 10/14/2014 0857   CO2 26 10/14/2014 0857   GLUCOSE 125* 10/14/2014 0857   BUN 9 10/14/2014 0857   CREATININE 0.91 10/14/2014 0857   CREATININE 0.77 04/02/2013 1025   CALCIUM 9.1 10/14/2014 0857   GFRNONAA 83* 10/14/2014 0857   GFRAA >90 10/14/2014 0857     Hepatic Function Panel     Component Value Date/Time   PROT 6.7 10/14/2014 0857   PROT 6.6 10/14/2014 0857   ALBUMIN 3.5 10/14/2014 0857   AST 19 10/14/2014 0857   ALT 12 10/14/2014 0857   ALKPHOS 82 10/14/2014 0857   BILITOT 0.4 10/14/2014 0857   BILIDIR 0.1 03/23/2014 1020   IBILI 0.5 03/23/2014 1020     CBC    Component Value Date/Time   WBC 4.7 10/14/2014 0857   RBC 4.11* 10/14/2014 0857   HGB 12.2* 10/14/2014 0857   HCT 35.4* 10/14/2014 0857   PLT 168 10/14/2014 0857   MCV 86.1 10/14/2014 0857   MCH 29.7 10/14/2014 0857   MCHC 34.5 10/14/2014 0857   RDW 14.9 10/14/2014 0857   LYMPHSABS 1.3 10/14/2014 0857   MONOABS 0.7 10/14/2014 0857   EOSABS 0.2  10/14/2014 0857   BASOSABS 0.1 10/14/2014 0857     BNP    Component Value Date/Time   PROBNP 119.0* 04/19/2010 1158    Lipid Panel     Component Value Date/Time   CHOL 113 03/23/2014 1020   TRIG 132 03/23/2014 1020   HDL 33* 03/23/2014 1020   CHOLHDL 3.4 03/23/2014 1020   VLDL 26 03/23/2014 1020   LDLCALC 54 03/23/2014 1020     RADIOLOGY: No results found.    ASSESSMENT AND PLAN: Mr. Takota Cahalan is a 73 year old male with a history of long-standing CAD dating back to 1996, hypertension, hyperlipidemia, and who is status post permanent pacemaker insertion.  His sleep study confirms at least moderate sleep apnea.  He had not tolerated CPAP therapy secondary to his complex sleep apnea including central events.  His most recent BiPAP study was reviewed with the patient in detail.  He will be initiating therapy this week at a pressure of 11/7.  At pressures above this, he again developed central events.  Subsequent, he will need to be seen in a sleep clinic following initiation of BiPAP  therapy to evaluate compliance and efficacy of treatment.  If continued central events develop.  He may require several ventilation.  Presently, he is without chest pain or angina.  His pacemaker was interrogated and his device function was normal.  2 brief episodes that were picked up with reference of nonsustained VT, one episode was in 2014 and on 10/02/2014.  He had a 10 beat run for which he was asymptomatic.  He will follow-up with Drs. Berry and Croitoru.  I will see him in a sleep clinic following initiation of BiPAP therapy.  Time spent: 25 minutes   Troy Sine, MD, [facility]  10/27/2014 7:31 PM

## 2014-11-02 ENCOUNTER — Telehealth: Payer: Self-pay | Admitting: Family Medicine

## 2014-11-02 NOTE — Telephone Encounter (Signed)
Rx prior auth APPROVED for pt's ondansetron (ZOFRAN) 4 MG tablet, auth good 08/03/14-11/01/15 through [employer], faxed approval to [facility] 11/01/14

## 2014-11-03 ENCOUNTER — Other Ambulatory Visit: Payer: Self-pay | Admitting: Cardiovascular Disease

## 2014-11-03 ENCOUNTER — Encounter: Payer: Self-pay | Admitting: Family Medicine

## 2014-11-03 ENCOUNTER — Emergency Department (HOSPITAL_COMMUNITY): Payer: Medicare Other

## 2014-11-03 ENCOUNTER — Observation Stay (HOSPITAL_COMMUNITY)
Admission: EM | Admit: 2014-11-03 | Discharge: 2014-11-04 | Disposition: A | Discharge status: Home / Self Care | Payer: Medicare Other | Attending: Internal Medicine | Admitting: Internal Medicine

## 2014-11-03 ENCOUNTER — Encounter (HOSPITAL_COMMUNITY): Payer: Self-pay | Admitting: Emergency Medicine

## 2014-11-03 ENCOUNTER — Ambulatory Visit (INDEPENDENT_AMBULATORY_CARE_PROVIDER_SITE_OTHER): Payer: Medicare Other | Admitting: Family Medicine

## 2014-11-03 VITALS — Temp 98.9°F | Ht 68.0 in | Wt 182.5 lb

## 2014-11-03 DIAGNOSIS — I509 Heart failure, unspecified: Secondary | ICD-10-CM | POA: Diagnosis not present

## 2014-11-03 DIAGNOSIS — I739 Peripheral vascular disease, unspecified: Secondary | ICD-10-CM | POA: Insufficient documentation

## 2014-11-03 DIAGNOSIS — Z9889 Other specified postprocedural states: Secondary | ICD-10-CM | POA: Insufficient documentation

## 2014-11-03 DIAGNOSIS — E785 Hyperlipidemia, unspecified: Secondary | ICD-10-CM | POA: Diagnosis not present

## 2014-11-03 DIAGNOSIS — I519 Heart disease, unspecified: Secondary | ICD-10-CM | POA: Insufficient documentation

## 2014-11-03 DIAGNOSIS — Z8546 Personal history of malignant neoplasm of prostate: Secondary | ICD-10-CM | POA: Diagnosis not present

## 2014-11-03 DIAGNOSIS — Z9981 Dependence on supplemental oxygen: Secondary | ICD-10-CM | POA: Insufficient documentation

## 2014-11-03 DIAGNOSIS — R531 Weakness: Secondary | ICD-10-CM

## 2014-11-03 DIAGNOSIS — D649 Anemia, unspecified: Secondary | ICD-10-CM | POA: Insufficient documentation

## 2014-11-03 DIAGNOSIS — Z7982 Long term (current) use of aspirin: Secondary | ICD-10-CM | POA: Insufficient documentation

## 2014-11-03 DIAGNOSIS — E876 Hypokalemia: Secondary | ICD-10-CM

## 2014-11-03 DIAGNOSIS — Z79899 Other long term (current) drug therapy: Secondary | ICD-10-CM | POA: Insufficient documentation

## 2014-11-03 DIAGNOSIS — K219 Gastro-esophageal reflux disease without esophagitis: Secondary | ICD-10-CM | POA: Insufficient documentation

## 2014-11-03 DIAGNOSIS — R110 Nausea: Secondary | ICD-10-CM | POA: Diagnosis not present

## 2014-11-03 DIAGNOSIS — F329 Major depressive disorder, single episode, unspecified: Secondary | ICD-10-CM | POA: Insufficient documentation

## 2014-11-03 DIAGNOSIS — J069 Acute upper respiratory infection, unspecified: Secondary | ICD-10-CM

## 2014-11-03 DIAGNOSIS — R42 Dizziness and giddiness: Secondary | ICD-10-CM | POA: Diagnosis not present

## 2014-11-03 DIAGNOSIS — I2510 Atherosclerotic heart disease of native coronary artery without angina pectoris: Secondary | ICD-10-CM | POA: Diagnosis not present

## 2014-11-03 DIAGNOSIS — R739 Hyperglycemia, unspecified: Secondary | ICD-10-CM | POA: Diagnosis not present

## 2014-11-03 DIAGNOSIS — J449 Chronic obstructive pulmonary disease, unspecified: Secondary | ICD-10-CM | POA: Insufficient documentation

## 2014-11-03 DIAGNOSIS — N321 Vesicointestinal fistula: Secondary | ICD-10-CM | POA: Insufficient documentation

## 2014-11-03 DIAGNOSIS — Z87891 Personal history of nicotine dependence: Secondary | ICD-10-CM | POA: Insufficient documentation

## 2014-11-03 DIAGNOSIS — Z8579 Personal history of other malignant neoplasms of lymphoid, hematopoietic and related tissues: Secondary | ICD-10-CM | POA: Diagnosis not present

## 2014-11-03 DIAGNOSIS — I10 Essential (primary) hypertension: Secondary | ICD-10-CM | POA: Insufficient documentation

## 2014-11-03 DIAGNOSIS — I672 Cerebral atherosclerosis: Secondary | ICD-10-CM | POA: Insufficient documentation

## 2014-11-03 DIAGNOSIS — J019 Acute sinusitis, unspecified: Secondary | ICD-10-CM | POA: Diagnosis not present

## 2014-11-03 DIAGNOSIS — N200 Calculus of kidney: Secondary | ICD-10-CM | POA: Diagnosis not present

## 2014-11-03 DIAGNOSIS — I719 Aortic aneurysm of unspecified site, without rupture: Secondary | ICD-10-CM | POA: Insufficient documentation

## 2014-11-03 DIAGNOSIS — R509 Fever, unspecified: Secondary | ICD-10-CM

## 2014-11-03 DIAGNOSIS — M1990 Unspecified osteoarthritis, unspecified site: Secondary | ICD-10-CM | POA: Insufficient documentation

## 2014-11-03 DIAGNOSIS — N320 Bladder-neck obstruction: Secondary | ICD-10-CM | POA: Insufficient documentation

## 2014-11-03 DIAGNOSIS — D801 Nonfamilial hypogammaglobulinemia: Secondary | ICD-10-CM | POA: Diagnosis not present

## 2014-11-03 DIAGNOSIS — R0602 Shortness of breath: Secondary | ICD-10-CM | POA: Diagnosis not present

## 2014-11-03 DIAGNOSIS — Z8673 Personal history of transient ischemic attack (TIA), and cerebral infarction without residual deficits: Secondary | ICD-10-CM | POA: Diagnosis not present

## 2014-11-03 DIAGNOSIS — Z8572 Personal history of non-Hodgkin lymphomas: Secondary | ICD-10-CM | POA: Diagnosis not present

## 2014-11-03 DIAGNOSIS — R011 Cardiac murmur, unspecified: Secondary | ICD-10-CM | POA: Diagnosis not present

## 2014-11-03 DIAGNOSIS — B9689 Other specified bacterial agents as the cause of diseases classified elsewhere: Secondary | ICD-10-CM

## 2014-11-03 DIAGNOSIS — G4730 Sleep apnea, unspecified: Secondary | ICD-10-CM | POA: Diagnosis not present

## 2014-11-03 DIAGNOSIS — Z8551 Personal history of malignant neoplasm of bladder: Secondary | ICD-10-CM | POA: Diagnosis not present

## 2014-11-03 DIAGNOSIS — R7989 Other specified abnormal findings of blood chemistry: Secondary | ICD-10-CM | POA: Diagnosis present

## 2014-11-03 DIAGNOSIS — I252 Old myocardial infarction: Secondary | ICD-10-CM | POA: Insufficient documentation

## 2014-11-03 DIAGNOSIS — Z7902 Long term (current) use of antithrombotics/antiplatelets: Secondary | ICD-10-CM | POA: Diagnosis not present

## 2014-11-03 LAB — CBC WITH DIFFERENTIAL/PLATELET
Basophils Absolute: 0 10*3/uL (ref 0.0–0.1)
Basophils Relative: 1 % (ref 0–1)
EOS ABS: 0.3 10*3/uL (ref 0.0–0.7)
Eosinophils Relative: 5 % (ref 0–5)
HCT: 34 % — ABNORMAL LOW (ref 39.0–52.0)
HEMOGLOBIN: 11.4 g/dL — AB (ref 13.0–17.0)
LYMPHS ABS: 1.4 10*3/uL (ref 0.7–4.0)
Lymphocytes Relative: 25 % (ref 12–46)
MCH: 28.9 pg (ref 26.0–34.0)
MCHC: 33.5 g/dL (ref 30.0–36.0)
MCV: 86.1 fL (ref 78.0–100.0)
MONO ABS: 0.4 10*3/uL (ref 0.1–1.0)
MONOS PCT: 7 % (ref 3–12)
NEUTROS PCT: 62 % (ref 43–77)
Neutro Abs: 3.4 10*3/uL (ref 1.7–7.7)
Platelets: 158 10*3/uL (ref 150–400)
RBC: 3.95 MIL/uL — AB (ref 4.22–5.81)
RDW: 15.6 % — ABNORMAL HIGH (ref 11.5–15.5)
WBC: 5.5 10*3/uL (ref 4.0–10.5)

## 2014-11-03 LAB — HEMOGLOBIN A1C
Hgb A1c MFr Bld: 5.6 % (ref <5.7)
MEAN PLASMA GLUCOSE: 114 mg/dL (ref <117)

## 2014-11-03 LAB — COMPREHENSIVE METABOLIC PANEL
ALT: 16 U/L (ref 0–53)
AST: 18 U/L (ref 0–37)
Albumin: 3.2 g/dL — ABNORMAL LOW (ref 3.5–5.2)
Alkaline Phosphatase: 74 U/L (ref 39–117)
Anion gap: 6 (ref 5–15)
BUN: 7 mg/dL (ref 6–23)
CALCIUM: 8.2 mg/dL — AB (ref 8.4–10.5)
CO2: 31 mmol/L (ref 19–32)
Chloride: 100 mEq/L (ref 96–112)
Creatinine, Ser: 0.89 mg/dL (ref 0.50–1.35)
GFR calc Af Amer: >90 mL/min (ref >90)
GFR calc non Af Amer: 83 mL/min — ABNORMAL LOW (ref >90)
GLUCOSE: 116 mg/dL — AB (ref 70–99)
Potassium: 2.2 mmol/L — CL (ref 3.5–5.1)
Sodium: 137 mmol/L (ref 135–145)
TOTAL PROTEIN: 6.4 g/dL (ref 6.0–8.3)
Total Bilirubin: 0.5 mg/dL (ref 0.3–1.2)

## 2014-11-03 LAB — BASIC METABOLIC PANEL
BUN: 7 mg/dL (ref 6–23)
CALCIUM: 8.7 mg/dL (ref 8.4–10.5)
CHLORIDE: 96 meq/L (ref 96–112)
CO2: 31 mEq/L (ref 19–32)
Creat: 0.91 mg/dL (ref 0.50–1.35)
Glucose, Bld: 83 mg/dL (ref 70–99)
Potassium: 2.6 mEq/L — CL (ref 3.5–5.3)
Sodium: 139 mEq/L (ref 135–145)

## 2014-11-03 LAB — MAGNESIUM
Magnesium: 1.7 mg/dL (ref 1.5–2.5)
Magnesium: 1.5 mg/dL (ref 1.5–2.5)

## 2014-11-03 MED ORDER — CEFPROZIL 500 MG PO TABS: 500.0000 mg | ORAL_TABLET | Freq: Two times a day (BID) | ORAL | Status: DC

## 2014-11-03 MED ORDER — DEXTROSE 5 % IV SOLN
1.0000 g | INTRAVENOUS | Status: DC
  Administered 2014-11-03: 1 g via INTRAVENOUS
  Filled 2014-11-03 (×4): qty 10

## 2014-11-03 MED ORDER — POTASSIUM CHLORIDE IN NACL 40-0.9 MEQ/L-% IV SOLN
INTRAVENOUS | Status: DC
  Administered 2014-11-03 – 2014-11-04 (×2): 75 mL/h via INTRAVENOUS
  Filled 2014-11-03 (×4): qty 1000

## 2014-11-03 MED ORDER — ONDANSETRON HCL 4 MG PO TABS: 4.0000 mg | ORAL_TABLET | Freq: Three times a day (TID) | ORAL | Status: DC | PRN

## 2014-11-03 MED ORDER — ASPIRIN EC 81 MG PO TBEC
81.0000 mg | DELAYED_RELEASE_TABLET | Freq: Every day | ORAL | Status: DC
  Administered 2014-11-03: 81 mg via ORAL
  Filled 2014-11-03 (×2): qty 1

## 2014-11-03 MED ORDER — HEPARIN SODIUM (PORCINE) 5000 UNIT/ML IJ SOLN
5000.0000 [IU] | Freq: Three times a day (TID) | INTRAMUSCULAR | Status: DC
  Administered 2014-11-03 – 2014-11-04 (×3): 5000 [IU] via SUBCUTANEOUS
  Filled 2014-11-03 (×4): qty 1

## 2014-11-03 MED ORDER — ALPRAZOLAM 0.5 MG PO TABS: 0.5000 mg | ORAL_TABLET | Freq: Every evening | ORAL | Status: DC | PRN

## 2014-11-03 MED ORDER — CITALOPRAM HYDROBROMIDE 20 MG PO TABS
40.0000 mg | ORAL_TABLET | Freq: Every day | ORAL | Status: DC
  Administered 2014-11-03 – 2014-11-04 (×2): 40 mg via ORAL
  Filled 2014-11-03: qty 2
  Filled 2014-11-03 (×3): qty 1
  Filled 2014-11-03: qty 2

## 2014-11-03 MED ORDER — AMLODIPINE BESYLATE 5 MG PO TABS
10.0000 mg | ORAL_TABLET | Freq: Every morning | ORAL | Status: DC
  Administered 2014-11-04: 10 mg via ORAL
  Filled 2014-11-03: qty 2

## 2014-11-03 MED ORDER — TRAMADOL HCL 50 MG PO TABS: 50.0000 mg | ORAL_TABLET | Freq: Three times a day (TID) | ORAL | Status: DC | PRN

## 2014-11-03 MED ORDER — OXYCODONE-ACETAMINOPHEN 5-325 MG PO TABS
1.0000 | ORAL_TABLET | Freq: Three times a day (TID) | ORAL | Status: DC | PRN
  Administered 2014-11-04: 1 via ORAL
  Filled 2014-11-03 (×2): qty 1

## 2014-11-03 MED ORDER — SIMVASTATIN 20 MG PO TABS
20.0000 mg | ORAL_TABLET | Freq: Every day | ORAL | Status: DC
  Administered 2014-11-03: 20 mg via ORAL
  Filled 2014-11-03: qty 1
  Filled 2014-11-03: qty 2

## 2014-11-03 MED ORDER — LENALIDOMIDE 10 MG PO CAPS: 10.0000 mg | ORAL_CAPSULE | Freq: Every day | ORAL | Status: DC

## 2014-11-03 MED ORDER — CLOPIDOGREL BISULFATE 75 MG PO TABS
75.0000 mg | ORAL_TABLET | Freq: Every day | ORAL | Status: DC
  Administered 2014-11-03 – 2014-11-04 (×2): 75 mg via ORAL
  Filled 2014-11-03 (×2): qty 1

## 2014-11-03 MED ORDER — POTASSIUM CHLORIDE CRYS ER 20 MEQ PO TBCR
40.0000 meq | EXTENDED_RELEASE_TABLET | Freq: Once | ORAL | Status: AC
  Administered 2014-11-03: 40 meq via ORAL
  Filled 2014-11-03: qty 2

## 2014-11-03 MED ORDER — DEXTROSE 5 % IV SOLN
500.0000 mg | INTRAVENOUS | Status: DC
  Administered 2014-11-03: 500 mg via INTRAVENOUS
  Filled 2014-11-03 (×4): qty 500

## 2014-11-03 MED ORDER — SODIUM CHLORIDE 0.9 % IJ SOLN
3.0000 mL | Freq: Two times a day (BID) | INTRAMUSCULAR | Status: DC
  Administered 2014-11-03 – 2014-11-04 (×2): 3 mL via INTRAVENOUS

## 2014-11-03 MED ORDER — POTASSIUM CHLORIDE 10 MEQ/100ML IV SOLN
10.0000 meq | Freq: Once | INTRAVENOUS | Status: AC
  Administered 2014-11-03: 10 meq via INTRAVENOUS
  Filled 2014-11-03: qty 100

## 2014-11-03 MED ORDER — PANTOPRAZOLE SODIUM 40 MG PO TBEC
40.0000 mg | DELAYED_RELEASE_TABLET | Freq: Every day | ORAL | Status: DC
  Administered 2014-11-03 – 2014-11-04 (×2): 40 mg via ORAL
  Filled 2014-11-03 (×2): qty 1

## 2014-11-03 MED ORDER — NITROGLYCERIN 0.4 MG SL SUBL: 0.4000 mg | SUBLINGUAL_TABLET | SUBLINGUAL | Status: DC | PRN

## 2014-11-03 NOTE — H&P (Signed)
Hospitalist Admission History and Physical  Patient name: Jeremy Parrish Medical record number: [id number] Date of birth: Jul 02, 1941 Age: 73 y.o. Gender: male  Primary Care Provider: [name], MD  Chief Complaint: URI, weakness, hypokalemia   History of Present Illness:This is a 73 y.o. year old male with significant past medical history of CAD, CHF, symptomatic bradycardia s/p pacemaker, HTN, hx/o lymphoma and multiple myeloma in remission on chronic immunosuppression, asthma, CVA w/ chronic dysarthria/expressive aphasia presenting with URI, weakness, hypokalemia. Pt reports 1-2 weeks of URI sxs including rhinorrhea, nasal congestion, cough. Reports subjective fever at home yesterday. Went to his PCP's office today because of sxs. Had some general bloodwork that showed K 2.8. Was redirected to ER for further evaluation. K 2.2 in ER. Afebrile. Hemodynamically stable. Oxygenating well on RA. CXR was negative for PNA. Head CT WNL. Reports some mild wheezing. No nausea or vomiting. No diarrhea. Weakness is diffuse. Denies any worsening of baseline deficits from prior stroke.   Assessment and Plan: Jeremy Parrish is a 73 y.o. year old male presenting with URI, weakness, hypokalemia   Active Problems:   Hypokalemia   URI (upper respiratory infection)   Weakness   1- URI  -likely viral process -check resp virus panel  -will place on CAP coverage as pt is on chronic immunosuppressant medication -blood culture -urine strep and legionella.  -prn home albuterol  -no significant wheezes on exam-defer steroids for now  -follow   2- Weakness -likely multifactorial in setting of above and hypokalemia -chronic weakness s/p CVA  -treat above  -replete K   3- Hypokalemia -replete  -check mag level  -no clear cut offending meds on list -follow -tele bed  4- CAD/HTN/CHF -no active CP  -fairly euvolemic on exam  -cont home regimen  -tele bed   5-hx/o lymphoma/multiple myeloma  -in  remission per pt  -on chronic immunosuppression  -R chest port CDI (family reports this is used periodically) -follow   FEN/GI: heart healthy diet  Prophylaxis: sub q heparin  Disposition: pending further evaluation  Code Status:Full Code    Patient Active Problem List   Diagnosis Date Noted  . Hypokalemia 11/03/2014  . URI (upper respiratory infection) 11/03/2014  . Weakness 11/03/2014  . Obstructive sleep apnea 08/07/2014  . Central sleep apnea 08/07/2014  . Inguinal hernia 05/04/2014  . Chest pain with moderate risk for cardiac etiology 11/25/2013  . Fever 09/13/2013  . Pneumonia 09/13/2013  . Pancytopenia 09/13/2013  . Muscle weakness (generalized) 08/04/2013  . Status post arthroscopy of shoulder 08/04/2013  . Pain in joint, shoulder region 08/04/2013  . Rotator cuff tear arthropathy of right shoulder 07/21/2013  . Left rotator cuff tear arthropathy 07/21/2013  . Shortness of breath 07/07/2013  . 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 Hyperlipidemia LDL goal < 100 10/08/2012  . Prediabetes 10/08/2012  . Expressive aphasia 10/08/2012  . Hemiplegia affecting right dominant side 10/08/2012  . 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 Hx of bladder cancer 10/07/2012  . 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 ASTHMA, UNSPECIFIED 03/22/2010  . Nonspecific (abnormal) findings on radiological and other examination of body structure 03/22/2010  . LYMPHOMA 03/21/2010  . Multiple myeloma in remission 03/21/2010  . Anxiety state 03/21/2010  . Essential hypertension 03/21/2010  . MYOCARDIAL INFARCTION 03/21/2010  . Coronary atherosclerosis 03/21/2010  . NEPHROLITHIASIS 03/21/2010  . ELEVATED PROSTATE SPECIFIC ANTIGEN 03/21/2010  .  ROTATOR CUFF REPAIR, RIGHT, HX OF 03/21/2010   Past Medical History: Past Medical History  Diagnosis Date  .  Hypertension   . Heart disease   . Kidney stones     history  . Lung mass   . Heart murmur   . 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 Coronary artery disease   . Depression   . Shortness of breath   . Peripheral arterial disease   . Bladder neck contracture   . Personal history of other diseases of circulatory system   . Aortic aneurysm of unspecified site without mention of rupture   . Arthritis   . Intestinovesical fistula   . Esophageal reflux   . Hyperlipidemia   . Anemia   . CHF (congestive heart failure)   . COPD (chronic obstructive pulmonary disease)   . Cerebral atherosclerosis     Carotid Doppler, 02/16/2013 - Bilateral Proximal ICAs,demonstrate mild plaque w/o evidence of significant diameter reduction, dissection, or any other vascular abnormality  . Complication of anesthesia   . PONV (postoperative nausea and vomiting)   . Stroke 2013    Speech.  . Hx of bladder cancer 10/07/2012  . Sleep apnea     05-02-14 cpap , not yet used- suggested settings 5  . Cancer   . Prostate cancer 2000  . Multiple myeloma   . Non Hodgkin's lymphoma   . Myocardial infarction     '96    Past Surgical History: Past Surgical History  Procedure Laterality Date  . Prostate surgery    . Heart stents x 5  1999  . Portacath placement  07/26/2009    right chest  . Wrist surgery      right  . Left ear skin cancer removed    . Bone marrow transplant  2011  . Pacemaker insertion  07/22/2011    Medtronic  . Coronary angioplasty  06/24/2000    PCI and stenting in mid & proximal RCA  . Insert / replace / remove pacemaker    . Tee without cardioversion  10/13/2012    Procedure: TRANSESOPHAGEAL ECHOCARDIOGRAM (TEE);  Surgeon: Sanda Klein, MD;  Location: [facility] ENDOSCOPY;  Service: Cardiovascular;  Laterality: N/A;  pat/kay/echo notified  . Colonoscopy N/A 01/01/2013    Procedure: COLONOSCOPY;  Surgeon: Rogene Houston, MD;   Location: [facility] ENDO SUITE;  Service: Endoscopy;  Laterality: N/A;  825-moved to [place] notified pt  . Rotator    . Rotator cuff repair Right   . Colon surgery      colon resection  . Bladder surgery    . Shoulder arthroscopy with subacromial decompression Right 07/21/2013    Procedure: RIGHT SHOULDER ARTHROSCOPY WITH SUBACROMIAL DECOMPRESSION AND DEBRIDEMENT & Injection of Left Shoulder;  Surgeon: Alta Corning, MD;  Location: [place];  Service: Orthopedics;  Laterality: Right;  . US echocardiography  06/19/2011    RV mildly dilated,mild to mod. MR,mild AI,mild PI  . Nm myocar perf wall motion  11/27/2007    inferior scar  . Inguinal hernia repair Right 05/04/2014    Procedure: OPEN RIGHT INGUINAL HERNIA REPAIR with mesh;  Surgeon: Edward Jolly, MD;  Location: [facility] ORS;  Service: General;  Laterality: Right;    Social History: History   Social History  . Marital Status: Married    Spouse Name: [name]    Number of Children: 3  . Years of Education: 9th   Occupational History  . retired  Social History Main Topics  . Smoking status: Former Smoker -- 1.00 packs/day for 20 years    Types: Cigarettes    Quit date: 11/14/1994  . Smokeless tobacco: Never Used  . Alcohol Use: No     Comment: previously drank but none for at least 15 years.  . Drug Use: No  . Sexual Activity: None   Other Topics Concern  . None   Social History Narrative   Patient lives at home spouse.   Caffeine Use: Occasionally    Family History: Family History  Problem Relation Age of Onset  . Colon cancer Neg Hx   . Colon polyps Neg Hx   . Cancer Father     bladder  . Heart disease Father     before age 5  . Hypertension Mother   . Cancer Brother   . Heart disease Brother     before age 63  . Heart disease Sister     before age 58  . Hyperlipidemia Sister   . Hypertension Sister   . Heart attack Sister     Allergies: Allergies  Allergen Reactions  . Diphenhydramine Hcl     REACTION:  "hyper"  . Morphine And Related Other (See Comments)    hallucinations  . Tape Rash    Paper tape is ok    Current Facility-Administered Medications  Medication Dose Route Frequency Provider Last Rate Last Dose  . 0.9 % NaCl with KCl 40 mEq / L  infusion   Intravenous Continuous Shanda Howells, MD      . ALPRAZolam Duanne Moron) tablet 0.5 mg  0.5 mg Oral QHS PRN Shanda Howells, MD      . Derrill Memo ON 11/04/2014] amLODipine (NORVASC) tablet 10 mg  10 mg Oral q morning - 10a Shanda Howells, MD      . aspirin EC tablet 81 mg  81 mg Oral QHS Shanda Howells, MD      . citalopram (CELEXA) tablet 40 mg  40 mg Oral Daily Shanda Howells, MD      . clopidogrel (PLAVIX) tablet 75 mg  75 mg Oral Daily Shanda Howells, MD      . heparin injection 5,000 Units  5,000 Units Subcutaneous 3 times per day Shanda Howells, MD      . lenalidomide (REVLIMID) capsule 10 mg  10 mg Oral Daily Shanda Howells, MD      . nitroGLYCERIN (NITROSTAT) SL tablet 0.4 mg  0.4 mg Sublingual Q5 min PRN Shanda Howells, MD      . ondansetron [facility]) tablet 4 mg  4 mg Oral Q8H PRN Shanda Howells, MD      . oxyCODONE-acetaminophen (PERCOCET/ROXICET) 5-325 MG per tablet 1 tablet  1 tablet Oral Q8H PRN Shanda Howells, MD      . pantoprazole (PROTONIX) EC tablet 40 mg  40 mg Oral Daily Shanda Howells, MD      . simvastatin (ZOCOR) tablet 20 mg  20 mg Oral QHS Shanda Howells, MD      . sodium chloride 0.9 % injection 3 mL  3 mL Intravenous Q12H Shanda Howells, MD       Current Outpatient Prescriptions  Medication Sig Dispense Refill  . ALPRAZolam (XANAX) 0.5 MG tablet Take 1 tablet (0.5 mg total) by mouth at bedtime as needed for anxiety. 90 tablet 1  . amLODipine (NORVASC) 10 MG tablet Take 1 tablet (10 mg total) by mouth every morning. 90 tablet 2  . aspirin EC 81 MG tablet Take 81 mg by mouth  at bedtime.    . cefPROZIL (CEFZIL) 500 MG tablet Take 1 tablet (500 mg total) by mouth 2 (two) times daily. 20 tablet 0  . citalopram (CELEXA) 40 MG tablet TAKE  1 TABLET BY MOUTH EVERY DAY 45 tablet 0  . clopidogrel (PLAVIX) 75 MG tablet TAKE 1 TABLET BY MOUTH EVERY DAY 90 tablet 3  . lenalidomide (REVLIMID) 10 MG capsule Take 1 capsule (10 mg total) by mouth daily. Take 1 capsule by mouth daily for 7 days on followed by 7 days off 14 capsule 0  . omeprazole (PRILOSEC) 20 MG capsule Take 1 capsule (20 mg total) by mouth daily. 90 capsule 1  . oxyCODONE-acetaminophen (PERCOCET/ROXICET) 5-325 MG per tablet Take 1 tablet by mouth every 8 (eight) hours as needed for severe pain. 90 tablet 0  . simvastatin (ZOCOR) 20 MG tablet Take 1 tablet (20 mg total) by mouth at bedtime. 90 tablet 2  . traMADol (ULTRAM) 50 MG tablet Take 1 tablet (50 mg total) by mouth every 8 (eight) hours as needed. (Patient taking differently: Take 50 mg by mouth every 8 (eight) hours as needed (pain). ) 60 tablet 2  . nitroGLYCERIN (NITROSTAT) 0.4 MG SL tablet Place 1 tablet (0.4 mg total) under the tongue every 5 (five) minutes as needed for chest pain. 25 tablet 12  . ondansetron (ZOFRAN) 4 MG tablet Take 1 tablet (4 mg total) by mouth every 8 (eight) hours as needed for nausea or vomiting. 30 tablet 2   Review Of Systems: 12 point ROS negative except as noted above in HPI.  Physical Exam: Filed Vitals:   11/03/14 1930  BP: 115/73  Pulse: 60  Temp:   Resp: 16    General: alert, cooperative and baseline dysarthria-chronic  HEENT: PERRLA and extra ocular movement intact Heart: S1, S2 normal, no murmur, rub or gallop, regular rate and rhythm Lungs: clear to auscultation, no wheezes or rales and unlabored breathing Abdomen: abdomen is soft without significant tenderness, masses, organomegaly or guarding Extremities: extremities normal, atraumatic, no cyanosis or edema Skin:no rashes, no ecchymoses Neurology: mild generalized weakness. Otherwise grossly normal   Labs and Imaging: Lab Results  Component Value Date/Time   NA 137 11/03/2014 04:21 PM   K 2.2* 11/03/2014 04:21  PM   CL 100 11/03/2014 04:21 PM   CO2 31 11/03/2014 04:21 PM   BUN 7 11/03/2014 04:21 PM   CREATININE 0.89 11/03/2014 04:21 PM   CREATININE 0.91 11/03/2014 09:22 AM   GLUCOSE 116* 11/03/2014 04:21 PM   Lab Results  Component Value Date   WBC 5.5 11/03/2014   HGB 11.4* 11/03/2014   HCT 34.0* 11/03/2014   MCV 86.1 11/03/2014   PLT 158 11/03/2014   Urinalysis No results found for: COLORURINE, APPEARANCEUR, LABSPEC, PHURINE, GLUCOSEU, HGBUR, BILIRUBINUR, KETONESUR, PROTEINUR, UROBILINOGEN, NITRITE, LEUKOCYTESUR     Ct Head Wo Contrast  11/03/2014   CLINICAL DATA:  ER patient with ahx of stroke c/o weakness and low potassium levels.  EXAM: CT HEAD WITHOUT CONTRAST  TECHNIQUE: Contiguous axial images were obtained from the base of the skull through the vertex without intravenous contrast.  COMPARISON:  10/07/2012  FINDINGS: Ventricles, cisterns and other CSF spaces are within normal. There is no mass, mass effect, shift of midline structures or acute hemorrhage. There is no evidence of acute infarction. There is an old small left frontal infarct. There is moderate opacification of the maxillary sinuses and ethmoid sinuses compatible with chronic inflammatory disease. Mastoid air cells are clear. Remaining  bony structures are within normal.  IMPRESSION: No acute intracranial findings.  Old small left frontal infarct.  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   Electronically Signed   By: Marin Olp M.D.   On: 11/03/2014 17:28   Dg Chest Portable 1 View  11/03/2014   CLINICAL DATA:  Nausea, lightheadedness, shortness of breath, hypokalemia, history Hodgkin's disease, pacemaker, coronary artery disease post stenting and MI, CHF, COPD, bladder cancer, prostate cancer, multiple myeloma  EXAM: PORTABLE CHEST - 1 VIEW  COMPARISON:  Portable exam 2493 hr compared to 09/13/2013  FINDINGS: LEFT subclavian pacemaker leads project over RIGHT atrium and  RIGHT ventricle.  RIGHT jugular Port-A-Cath tip projects over SVC.  Normal heart size.  Slightly prominent RIGHT pulmonary hilum likely related to prominent pulmonary vascular structures and unchanged.  Atherosclerotic calcification aorta.  Lungs clear.  No pleural effusion or pneumothorax.  IMPRESSION: No acute abnormalities.   Electronically Signed   By: Lavonia Dana M.D.   On: 11/03/2014 17:59           Shanda Howells MD  Pager: [phone]

## 2014-11-03 NOTE — Progress Notes (Signed)
Patient referred to [facility] 09/26/14.

## 2014-11-03 NOTE — Patient Instructions (Signed)
Because you are on Plavix avoid anti inflammatory  May use tramadol and oxycodone as needed  Weakness may be related to low potassium, please get your labs drawn  Start cefzil 500 one 2 times a day for 10 days for pain  I believe you also have a virus that is making you feel bad  If worse follow up

## 2014-11-03 NOTE — ED Provider Notes (Signed)
CSN: [id number]     Arrival date & time 11/03/14  1552 History   First MD Initiated Contact with Patient 11/03/14 1555     Chief Complaint  Patient presents with  . Abnormal Lab     (Consider location/radiation/quality/duration/timing/severity/associated sxs/prior Treatment) Patient is a 73 y.o. male presenting with cough. The history is provided by the patient (the pt complains of cough and weakness).  Cough Cough characteristics:  Non-productive Onset quality:  Gradual Timing:  Constant Chronicity:  New Smoker: no   Context: not animal exposure   Associated symptoms: no chest pain, no eye discharge, no headaches and no rash     Past Medical History  Diagnosis Date  . Hypertension   . Heart disease   . Kidney stones     history  . Lung mass   . Heart murmur   . 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 Coronary artery disease   . Depression   . Shortness of breath   . Peripheral arterial disease   . Bladder neck contracture   . Personal history of other diseases of circulatory system   . Aortic aneurysm of unspecified site without mention of rupture   . Arthritis   . Intestinovesical fistula   . Esophageal reflux   . Hyperlipidemia   . Anemia   . CHF (congestive heart failure)   . COPD (chronic obstructive pulmonary disease)   . Cerebral atherosclerosis     Carotid Doppler, 02/16/2013 - Bilateral Proximal ICAs,demonstrate mild plaque w/o evidence of significant diameter reduction, dissection, or any other vascular abnormality  . Complication of anesthesia   . PONV (postoperative nausea and vomiting)   . Stroke 2013    Speech.  . Hx of bladder cancer 10/07/2012  . Sleep apnea     05-02-14 cpap , not yet used- suggested settings 5  . Cancer   . Prostate cancer 2000  . Multiple myeloma   . Non Hodgkin's lymphoma   . Myocardial infarction     '96   Past Surgical History  Procedure Laterality Date  .  Prostate surgery    . Heart stents x 5  1999  . Portacath placement  07/26/2009    right chest  . Wrist surgery      right  . Left ear skin cancer removed    . Bone marrow transplant  2011  . Pacemaker insertion  07/22/2011    [employer]  . Coronary angioplasty  06/24/2000    PCI and stenting in mid & proximal RCA  . Insert / replace / remove pacemaker    . Tee without cardioversion  10/13/2012    Procedure: TRANSESOPHAGEAL ECHOCARDIOGRAM (TEE);  Surgeon: Sanda Klein, MD;  Location: [facility];  Service: Cardiovascular;  Laterality: N/A;  pat/kay/echo notified  . Colonoscopy N/A 01/01/2013    Procedure: COLONOSCOPY;  Surgeon: Rogene Houston, MD;  Location: [facility] SUITE;  Service: Endoscopy;  Laterality: N/A;  825-moved to [place] notified pt  . Rotator    . Rotator cuff repair Right   . Colon surgery      colon resection  . Bladder surgery    . Shoulder arthroscopy with subacromial decompression Right 07/21/2013    Procedure: RIGHT SHOULDER ARTHROSCOPY WITH SUBACROMIAL DECOMPRESSION AND DEBRIDEMENT & Injection of Left Shoulder;  Surgeon: Alta Corning, MD;  Location: [facility];  Service: Orthopedics;  Laterality: Right;  . US echocardiography  06/19/2011    RV mildly  dilated,mild to mod. MR,mild AI,mild PI  . Nm myocar perf wall motion  11/27/2007    inferior scar  . Inguinal hernia repair Right 05/04/2014    Procedure: OPEN RIGHT INGUINAL HERNIA REPAIR with mesh;  Surgeon: Edward Jolly, MD;  Location: [facility];  Service: General;  Laterality: Right;   Family History  Problem Relation Age of Onset  . Colon cancer Neg Hx   . Colon polyps Neg Hx   . Cancer Father     bladder  . Heart disease Father     before age 60  . Hypertension Mother   . Cancer Brother   . Heart disease Brother     before age 82  . Heart disease Sister     before age 74  . Hyperlipidemia Sister   . Hypertension Sister   . Heart attack Sister    History  Substance Use Topics  . Smoking status: Former  Smoker -- 1.00 packs/day for 20 years    Types: Cigarettes    Quit date: 11/14/1994  . Smokeless tobacco: Never Used  . Alcohol Use: No     Comment: previously drank but none for at least 15 years.    Review of Systems  Constitutional: Negative for appetite change and fatigue.  HENT: Negative for congestion, ear discharge and sinus pressure.   Eyes: Negative for discharge.  Respiratory: Positive for cough.   Cardiovascular: Negative for chest pain.  Gastrointestinal: Negative for abdominal pain and diarrhea.  Genitourinary: Negative for frequency and hematuria.  Musculoskeletal: Negative for back pain.  Skin: Negative for rash.  Neurological: Negative for seizures and headaches.       Slurred speech  Psychiatric/Behavioral: Negative for hallucinations.      Allergies  Diphenhydramine hcl; Morphine and related; and Tape  Home Medications   Prior to Admission medications   Medication Sig Start Date End Date Taking? Authorizing Provider  ALPRAZolam Duanne Moron) 0.5 MG tablet Take 1 tablet (0.5 mg total) by mouth at bedtime as needed for anxiety. 09/19/14  Yes Baird Cancer, PA-C  amLODipine (NORVASC) 10 MG tablet Take 1 tablet (10 mg total) by mouth every morning. 06/07/14  Yes Lorretta Harp, MD  aspirin EC 81 MG tablet Take 81 mg by mouth at bedtime.   Yes Historical Provider, MD  cefPROZIL (CEFZIL) 500 MG tablet Take 1 tablet (500 mg total) by mouth 2 (two) times daily. 11/03/14  Yes Kathyrn Drown, MD  citalopram (CELEXA) 40 MG tablet TAKE 1 TABLET BY MOUTH EVERY DAY 09/19/14  Yes Mikey Kirschner, MD  clopidogrel (PLAVIX) 75 MG tablet TAKE 1 TABLET BY MOUTH EVERY DAY 11/03/14  Yes Troy Sine, MD  lenalidomide (REVLIMID) 10 MG capsule Take 1 capsule (10 mg total) by mouth daily. Take 1 capsule by mouth daily for 7 days on followed by 7 days off 10/19/14  Yes Baird Cancer, PA-C  omeprazole (PRILOSEC) 20 MG capsule Take 1 capsule (20 mg total) by mouth daily. 09/20/14  Yes  Mikey Kirschner, MD  oxyCODONE-acetaminophen (PERCOCET/ROXICET) 5-325 MG per tablet Take 1 tablet by mouth every 8 (eight) hours as needed for severe pain. 09/19/14  Yes Manon Hilding Kefalas, PA-C  simvastatin (ZOCOR) 20 MG tablet Take 1 tablet (20 mg total) by mouth at bedtime. 05/16/14  Yes Lorretta Harp, MD  traMADol (ULTRAM) 50 MG tablet Take 1 tablet (50 mg total) by mouth every 8 (eight) hours as needed. Patient taking differently: Take 50 mg by mouth  every 8 (eight) hours as needed (pain).  11/03/14  Yes Kathyrn Drown, MD  nitroGLYCERIN (NITROSTAT) 0.4 MG SL tablet Place 1 tablet (0.4 mg total) under the tongue every 5 (five) minutes as needed for chest pain. 04/02/13   Kathyrn Drown, MD  ondansetron (ZOFRAN) 4 MG tablet Take 1 tablet (4 mg total) by mouth every 8 (eight) hours as needed for nausea or vomiting. 10/25/14   Mikey Kirschner, MD   BP 117/67 mmHg  Pulse 63  Temp(Src) 98.9 F (37.2 C) (Oral)  Resp 22  Ht $R'5\' 8"'BS$  (1.727 m)  Wt 182 lb (82.555 kg)  BMI 27.68 kg/m2  SpO2 96% Physical Exam  Constitutional: He is oriented to person, place, and time. He appears well-developed.  HENT:  Head: Normocephalic.  Eyes: Conjunctivae and EOM are normal. No scleral icterus.  Neck: Neck supple. No thyromegaly present.  Cardiovascular: Normal rate and regular rhythm.  Exam reveals no gallop and no friction rub.   No murmur heard. Pulmonary/Chest: No stridor. He has no wheezes. He has no rales. He exhibits no tenderness.  Abdominal: He exhibits no distension. There is no tenderness. There is no rebound.  Musculoskeletal: Normal range of motion. He exhibits no edema.  Lymphadenopathy:    He has no cervical adenopathy.  Neurological: He is oriented to person, place, and time. He exhibits normal muscle tone.  Slurred speech  Skin: No rash noted. No erythema.  Psychiatric: He has a normal mood and affect. His behavior is normal.    ED Course  Procedures (including critical care  time) Labs Review Labs Reviewed  CBC WITH DIFFERENTIAL - Abnormal; Notable for the following:    RBC 3.95 (*)    Hemoglobin 11.4 (*)    HCT 34.0 (*)    RDW 15.6 (*)    All other components within normal limits  COMPREHENSIVE METABOLIC PANEL - Abnormal; Notable for the following:    Potassium 2.2 (*)    Glucose, Bld 116 (*)    Calcium 8.2 (*)    Albumin 3.2 (*)    GFR calc non Af Amer 83 (*)    All other components within normal limits    Imaging Review Ct Head Wo Contrast  11/03/2014   CLINICAL DATA:  ER patient with ahx of stroke c/o weakness and low potassium levels.  EXAM: CT HEAD WITHOUT CONTRAST  TECHNIQUE: Contiguous axial images were obtained from the base of the skull through the vertex without intravenous contrast.  COMPARISON:  10/07/2012  FINDINGS: Ventricles, cisterns and other CSF spaces are within normal. There is no mass, mass effect, shift of midline structures or acute hemorrhage. There is no evidence of acute infarction. There is an old small left frontal infarct. There is moderate opacification of the maxillary sinuses and ethmoid sinuses compatible with chronic inflammatory disease. Mastoid air cells are clear. Remaining bony structures are within normal.  IMPRESSION: No acute intracranial findings.  Old small left frontal infarct.  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   Electronically Signed   By: Marin Olp M.D.   On: 11/03/2014 17:28   Dg Chest Portable 1 View  11/03/2014   CLINICAL DATA:  Nausea, lightheadedness, shortness of breath, hypokalemia, history Hodgkin's disease, pacemaker, coronary artery disease post stenting and MI, CHF, COPD, bladder cancer, prostate cancer, multiple myeloma  EXAM: PORTABLE CHEST - 1 VIEW  COMPARISON:  Portable exam 4196 hr compared to 09/13/2013  FINDINGS: LEFT subclavian pacemaker leads project over RIGHT  atrium and RIGHT ventricle.  RIGHT jugular Port-A-Cath tip projects over  SVC.  Normal heart size.  Slightly prominent RIGHT pulmonary hilum likely related to prominent pulmonary vascular structures and unchanged.  Atherosclerotic calcification aorta.  Lungs clear.  No pleural effusion or pneumothorax.  IMPRESSION: No acute abnormalities.   Electronically Signed   By: Lavonia Dana M.D.   On: 11/03/2014 17:59     EKG Interpretation   Date/Time:  Thursday November 03 2014 17:28:25 EST Ventricular Rate:  63 PR Interval:  176 QRS Duration: 131 QT Interval:  463 QTC Calculation: 474 R Axis:   -69 Text Interpretation:  Sinus rhythm Nonspecific IVCD with LAD Confirmed by  Lithzy Bernard  MD, Chason Mciver [id number]) on 11/03/2014 7:32:54 PM      MDM   Final diagnoses:  Weak  SOB (shortness of breath)  Hypokalemia    Admit hypokalemia   Maudry Diego, MD 11/03/14 [phone]

## 2014-11-03 NOTE — ED Notes (Addendum)
Pt reports was sent here from PCP office regarding low potassium. Pt alert. Pt denies any weakness,pain,n/v/d.

## 2014-11-03 NOTE — ED Notes (Signed)
CRITICAL VALUE ALERT  Critical value received: Potassium 2.2  Date of notification:  11/03/14  Time of notification:  1709  Critical value read back:Yes.    Nurse who received alert:  Parthenia Ames  MD notified (1st page):  Dr. Roderic Palau  Time of first page:  1709  MD notified (2nd page):  Time of second page:  Responding MD:  Dr. Roderic Palau  Time MD responded:  [phone]

## 2014-11-03 NOTE — Progress Notes (Signed)
73 yo M started on Rocephin & Zithromax for possible CAP. He has normal renal function.  Estimated CrCl ~ 75-80 ml/min.  Rocephin and Zithromax do not require renal dose adjustments.   Continue antibiotics as ordered by MD.   Pharmacy to sign off.  Please re-consult pharmacy if needed.   Thanks. Netta Cedars, PharmD, BCPS 11/03/2014@11 :18 PM

## 2014-11-03 NOTE — ED Notes (Signed)
Patient transported to CT 

## 2014-11-03 NOTE — Progress Notes (Signed)
   Subjective:    Patient ID: Jeremy Parrish, male    DOB: 1941-03-02, 73 y.o.   MRN: [id number]  Cough This is a new problem. The current episode started yesterday. Associated symptoms include a fever (low) and rhinorrhea. Pertinent negatives include no chest pain, ear pain or wheezing. Associated symptoms comments: Temp 99.4.   Viral like illness 5 days ago Past few days increased cough, sinus pressure,low grade fever Feels worse today Hx heart disease and active cancer treatment  Pt relates increased weakness for 3 weeks Met 7 checked 3 weeks ago with low potassium pt does not recall being put on K   Review of Systems  Constitutional: Positive for fever (low) and fatigue. Negative for activity change.  HENT: Positive for congestion and rhinorrhea. Negative for ear pain.   Eyes: Negative for discharge.  Respiratory: Positive for cough. Negative for wheezing.   Cardiovascular: Negative for chest pain.       Objective:   Physical Exam  Constitutional: He appears well-developed.  HENT:  Head: Normocephalic.  Mouth/Throat: Oropharynx is clear and moist. No oropharyngeal exudate.  Neck: Normal range of motion.  Cardiovascular: Normal rate, regular rhythm and normal heart sounds.   No murmur heard. Pulmonary/Chest: Effort normal and breath sounds normal. He has no wheezes.  Lymphadenopathy:    He has no cervical adenopathy.  Neurological: He exhibits normal muscle tone.  Skin: Skin is warm and dry.  Nursing note and vitals reviewed.         Assessment & Plan:  Viral uri Acute rhinosinusitis- antibiotics Weakness- check labs Warnings discussed  Addendum- met 7 returned critically low potassium sopke with pt and er doc Will need IV via pot a cath, will also need oral K After ER Tx then can follow up Met7 earlier next week

## 2014-11-03 NOTE — Progress Notes (Signed)
Patient/Family oriented to room. Information packet given to patient/family. Admission inpatient armband information verified with patient/family to include name and date of birth and placed on patient arm. Side rails up x 2, fall assessment and education completed with patient/family. Call light within reach. Patient/family able to voice and demonstrate understanding of unit orientation instructions  Daleyza Gadomski D, RN  

## 2014-11-03 NOTE — Telephone Encounter (Signed)
Rx refill sent to patient pharmacy   

## 2014-11-04 DIAGNOSIS — J069 Acute upper respiratory infection, unspecified: Secondary | ICD-10-CM | POA: Diagnosis not present

## 2014-11-04 DIAGNOSIS — E876 Hypokalemia: Secondary | ICD-10-CM

## 2014-11-04 DIAGNOSIS — R0602 Shortness of breath: Secondary | ICD-10-CM | POA: Diagnosis not present

## 2014-11-04 DIAGNOSIS — R531 Weakness: Secondary | ICD-10-CM

## 2014-11-04 LAB — CBC WITH DIFFERENTIAL/PLATELET
Basophils Absolute: 0 10*3/uL (ref 0.0–0.1)
Basophils Relative: 1 % (ref 0–1)
EOS PCT: 6 % — AB (ref 0–5)
Eosinophils Absolute: 0.3 10*3/uL (ref 0.0–0.7)
HEMATOCRIT: 32.1 % — AB (ref 39.0–52.0)
HEMOGLOBIN: 10.4 g/dL — AB (ref 13.0–17.0)
LYMPHS PCT: 20 % (ref 12–46)
Lymphs Abs: 0.9 10*3/uL (ref 0.7–4.0)
MCH: 28.4 pg (ref 26.0–34.0)
MCHC: 32.4 g/dL (ref 30.0–36.0)
MCV: 87.7 fL (ref 78.0–100.0)
MONO ABS: 0.4 10*3/uL (ref 0.1–1.0)
MONOS PCT: 8 % (ref 3–12)
Neutro Abs: 3 10*3/uL (ref 1.7–7.7)
Neutrophils Relative %: 65 % (ref 43–77)
Platelets: 154 10*3/uL (ref 150–400)
RBC: 3.66 MIL/uL — ABNORMAL LOW (ref 4.22–5.81)
RDW: 15.9 % — AB (ref 11.5–15.5)
WBC: 4.6 10*3/uL (ref 4.0–10.5)

## 2014-11-04 LAB — COMPREHENSIVE METABOLIC PANEL
ALT: 16 U/L (ref 0–53)
AST: 16 U/L (ref 0–37)
Albumin: 2.8 g/dL — ABNORMAL LOW (ref 3.5–5.2)
Alkaline Phosphatase: 74 U/L (ref 39–117)
Anion gap: 5 (ref 5–15)
BILIRUBIN TOTAL: 0.4 mg/dL (ref 0.3–1.2)
BUN: 8 mg/dL (ref 6–23)
CHLORIDE: 107 meq/L (ref 96–112)
CO2: 28 mmol/L (ref 19–32)
Calcium: 7.8 mg/dL — ABNORMAL LOW (ref 8.4–10.5)
Creatinine, Ser: 0.79 mg/dL (ref 0.50–1.35)
GFR calc Af Amer: >90 mL/min (ref >90)
GFR calc non Af Amer: 88 mL/min — ABNORMAL LOW (ref >90)
Glucose, Bld: 130 mg/dL — ABNORMAL HIGH (ref 70–99)
Potassium: 2.9 mmol/L — ABNORMAL LOW (ref 3.5–5.1)
Sodium: 140 mmol/L (ref 135–145)
Total Protein: 5.8 g/dL — ABNORMAL LOW (ref 6.0–8.3)

## 2014-11-04 LAB — HIV ANTIBODY (ROUTINE TESTING W REFLEX): HIV: NONREACTIVE

## 2014-11-04 LAB — STREP PNEUMONIAE URINARY ANTIGEN: Strep Pneumo Urinary Antigen: NEGATIVE

## 2014-11-04 LAB — POTASSIUM: POTASSIUM: 3.5 mmol/L (ref 3.5–5.1)

## 2014-11-04 MED ORDER — POTASSIUM CHLORIDE CRYS ER 20 MEQ PO TBCR
40.0000 meq | EXTENDED_RELEASE_TABLET | ORAL | Status: AC
  Administered 2014-11-04 (×3): 40 meq via ORAL
  Filled 2014-11-04 (×3): qty 2

## 2014-11-04 MED ORDER — ALBUTEROL SULFATE HFA 108 (90 BASE) MCG/ACT IN AERS: 2.0000 | INHALATION_SPRAY | Freq: Four times a day (QID) | RESPIRATORY_TRACT | Status: DC | PRN

## 2014-11-04 MED ORDER — LENALIDOMIDE 10 MG PO CAPS
10.0000 mg | ORAL_CAPSULE | Freq: Every day | ORAL | Status: DC
  Administered 2014-11-04: 10 mg via ORAL

## 2014-11-04 MED ORDER — POTASSIUM CHLORIDE ER 20 MEQ PO TBCR: 20.0000 meq | EXTENDED_RELEASE_TABLET | Freq: Every day | ORAL | Status: DC

## 2014-11-04 MED ORDER — LEVOFLOXACIN 500 MG PO TABS: 500.0000 mg | ORAL_TABLET | Freq: Every day | ORAL | Status: DC

## 2014-11-04 MED ORDER — MAGNESIUM SULFATE 4 GM/100ML IV SOLN
4.0000 g | Freq: Once | INTRAVENOUS | Status: AC
  Administered 2014-11-04: 4 g via INTRAVENOUS
  Filled 2014-11-04: qty 100

## 2014-11-04 NOTE — Discharge Summary (Signed)
Physician Discharge Summary  Jeremy Parrish VQM:[id number] DOB: [birth year] DOA: 11/03/2014  PCP: [name], MD  Admit date: 11/03/2014 Discharge date: 11/04/2014  Time spent: 40 minutes  Recommendations for Outpatient Follow-up:  1. Follow up with primary care physician next week for repeat potassium check  Discharge Diagnoses:  Active Problems:   Hypokalemia   URI (upper respiratory infection)   Weakness hypertension Multiple myeloma Anemia of chronic disease  Discharge Condition: improved  Diet recommendation: regular diet  Filed Weights   11/03/14 1602 11/04/14 0530  Weight: 82.555 kg (182 lb) 81.8 kg (180 lb 5.4 oz)    History of present illness:  This 73 year old patient with a history of myeloma, symptomatic bradycardia status post pacemaker presents to the emergency room with complaints of upper respiratory tract infection, generalized weakness and hypokalemia. His potassium in the emergency room was found to be low at 2.2. He felt somewhat short of breath and had subjective fevers at home. He complained of rhinorrhea, nasal congestion and cough. He was admitted to the hospital for further treatments.  Hospital Course:  Patient's potassium was aggressively repeated. It is now back in normal range at 3.5. His generalized weakness has resolved and he is feeling significantly better. He reports being on potassium supplementation the past but has not been on any supplements and several months since he felt that there were causing him to retain fluid. He was started on maintenance potassium supplementation. He will have a repeat potassium check with his primary care physician next week.  History shortness of breath/rhinorrhea/subjective fevers have resolved. He was started on a course of antibiotics and will complete out a course of levofloxacin. He does not have any evidence of pneumonia.  Patient is otherwise back to his baseline level of functioning and is ready for  discharge home.  Procedures:    Consultations:    Discharge Exam: Filed Vitals:   11/04/14 1547  BP: 132/56  Pulse: 82  Temp: 98.1 F (36.7 C)  Resp: 18    General: NAD Cardiovascular: S1, S2 RRR Respiratory: CTA B  Discharge Instructions   Discharge Instructions    Call MD for:  extreme fatigue    Complete by:  As directed      Call MD for:  persistant dizziness or light-headedness    Complete by:  As directed      Diet - low sodium heart healthy    Complete by:  As directed      Increase activity slowly    Complete by:  As directed           Current Discharge Medication List    START taking these medications   Details  albuterol (PROVENTIL HFA;VENTOLIN HFA) 108 (90 BASE) MCG/ACT inhaler Inhale 2 puffs into the lungs every 6 (six) hours as needed for wheezing or shortness of breath. Qty: 1 Inhaler, Refills: 2    levofloxacin (LEVAQUIN) 500 MG tablet Take 1 tablet (500 mg total) by mouth daily. Qty: 5 tablet, Refills: 0    potassium chloride 20 MEQ TBCR Take 20 mEq by mouth daily. Qty: 30 tablet, Refills: 0      CONTINUE these medications which have NOT CHANGED   Details  ALPRAZolam (XANAX) 0.5 MG tablet Take 1 tablet (0.5 mg total) by mouth at bedtime as needed for anxiety. Qty: 90 tablet, Refills: 1   Associated Diagnoses: Anxiety state    amLODipine (NORVASC) 10 MG tablet Take 1 tablet (10 mg total) by mouth every morning. Qty: 90 tablet,  Refills: 2   Associated Diagnoses: Multiple myeloma, without mention of having achieved remission    aspirin EC 81 MG tablet Take 81 mg by mouth at bedtime.    citalopram (CELEXA) 40 MG tablet TAKE 1 TABLET BY MOUTH EVERY DAY Qty: 45 tablet, Refills: 0    clopidogrel (PLAVIX) 75 MG tablet TAKE 1 TABLET BY MOUTH EVERY DAY Qty: 90 tablet, Refills: 3    lenalidomide (REVLIMID) 10 MG capsule Take 1 capsule (10 mg total) by mouth daily. Take 1 capsule by mouth daily for 7 days on followed by 7 days off Qty: 14  capsule, Refills: 0   Associated Diagnoses: Multiple myeloma    omeprazole (PRILOSEC) 20 MG capsule Take 1 capsule (20 mg total) by mouth daily. Qty: 90 capsule, Refills: 1    oxyCODONE-acetaminophen (PERCOCET/ROXICET) 5-325 MG per tablet Take 1 tablet by mouth every 8 (eight) hours as needed for severe pain. Qty: 90 tablet, Refills: 0   Associated Diagnoses: Multiple myeloma in remission; Rotator cuff tear arthropathy of right shoulder; Left rotator cuff tear arthropathy    simvastatin (ZOCOR) 20 MG tablet Take 1 tablet (20 mg total) by mouth at bedtime. Qty: 90 tablet, Refills: 2   Associated Diagnoses: Multiple myeloma, without mention of having achieved remission    traMADol (ULTRAM) 50 MG tablet Take 1 tablet (50 mg total) by mouth every 8 (eight) hours as needed. Qty: 60 tablet, Refills: 2    nitroGLYCERIN (NITROSTAT) 0.4 MG SL tablet Place 1 tablet (0.4 mg total) under the tongue every 5 (five) minutes as needed for chest pain. Qty: 25 tablet, Refills: 12    ondansetron (ZOFRAN) 4 MG tablet Take 1 tablet (4 mg total) by mouth every 8 (eight) hours as needed for nausea or vomiting. Qty: 30 tablet, Refills: 2      STOP taking these medications     cefPROZIL (CEFZIL) 500 MG tablet        Allergies  Allergen Reactions  . Diphenhydramine Hcl     REACTION: "hyper"  . Morphine And Related Other (See Comments)    hallucinations  . Tape Rash    Paper tape is ok   Follow-up Information    Follow up with [name], MD.   Specialty:  Family Medicine   Why:  follow up next week for repeat potassium level   Contact information:   [address] [place] [address] [phone]        The results of significant diagnostics from this hospitalization (including imaging, microbiology, ancillary and laboratory) are listed below for reference.    Significant Diagnostic Studies: Ct Head Wo Contrast  11/03/2014   CLINICAL DATA:  ER patient with ahx of stroke c/o  weakness and low potassium levels.  EXAM: CT HEAD WITHOUT CONTRAST  TECHNIQUE: Contiguous axial images were obtained from the base of the skull through the vertex without intravenous contrast.  COMPARISON:  10/07/2012  FINDINGS: Ventricles, cisterns and other CSF spaces are within normal. There is no mass, mass effect, shift of midline structures or acute hemorrhage. There is no evidence of acute infarction. There is an old small left frontal infarct. There is moderate opacification of the maxillary sinuses and ethmoid sinuses compatible with chronic inflammatory disease. Mastoid air cells are clear. Remaining bony structures are within normal.  IMPRESSION: No acute intracranial findings.  Old small left frontal infarct.  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   Electronically Signed   By: Marin Olp  M.D.   On: 11/03/2014 17:28   Dg Chest Portable 1 View  11/03/2014   CLINICAL DATA:  Nausea, lightheadedness, shortness of breath, hypokalemia, history Hodgkin's disease, pacemaker, coronary artery disease post stenting and MI, CHF, COPD, bladder cancer, prostate cancer, multiple myeloma  EXAM: PORTABLE CHEST - 1 VIEW  COMPARISON:  Portable exam 7425 hr compared to 09/13/2013  FINDINGS: LEFT subclavian pacemaker leads project over RIGHT atrium and RIGHT ventricle.  RIGHT jugular Port-A-Cath tip projects over SVC.  Normal heart size.  Slightly prominent RIGHT pulmonary hilum likely related to prominent pulmonary vascular structures and unchanged.  Atherosclerotic calcification aorta.  Lungs clear.  No pleural effusion or pneumothorax.  IMPRESSION: No acute abnormalities.   Electronically Signed   By: Lavonia Dana M.D.   On: 11/03/2014 17:59    Microbiology: Recent Results (from the past 240 hour(s))  Culture, blood (routine x 2) Call MD if unable to obtain prior to antibiotics being given     Status: None (Preliminary result)   Collection Time: 11/03/14   8:10 PM  Result Value Ref Range Status   Specimen Description BLOOD LEFT ARM  Final   Special Requests BOTTLES DRAWN AEROBIC AND ANAEROBIC [place]  Final   Culture PENDING  Incomplete   Report Status PENDING  Incomplete  Culture, blood (routine x 2) Call MD if unable to obtain prior to antibiotics being given     Status: None (Preliminary result)   Collection Time: 11/03/14  8:15 PM  Result Value Ref Range Status   Specimen Description BLOOD RIGHT ARM  Final   Special Requests BOTTLES DRAWN AEROBIC AND ANAEROBIC [place]  Final   Culture PENDING  Incomplete   Report Status PENDING  Incomplete     Labs: Basic Metabolic Panel:  Recent Labs Lab 11/03/14 0922 11/03/14 1621 11/03/14 1936 11/04/14 0610 11/04/14 1729  NA 139 137  --  140  --   K 2.6* 2.2*  --  2.9* 3.5  CL 96 100  --  107  --   CO2 31 31  --  28  --   GLUCOSE 83 116*  --  130*  --   BUN 7 7  --  8  --   CREATININE 0.91 0.89  --  0.79  --   CALCIUM 8.7 8.2*  --  7.8*  --   MG 1.7  --  1.5  --   --    Liver Function Tests:  Recent Labs Lab 11/03/14 1621 11/04/14 0610  AST 18 16  ALT 16 16  ALKPHOS 74 74  BILITOT 0.5 0.4  PROT 6.4 5.8*  ALBUMIN 3.2* 2.8*   No results for input(s): LIPASE, AMYLASE in the last 168 hours. No results for input(s): AMMONIA in the last 168 hours. CBC:  Recent Labs Lab 11/03/14 1621 11/04/14 0610  WBC 5.5 4.6  NEUTROABS 3.4 3.0  HGB 11.4* 10.4*  HCT 34.0* 32.1*  MCV 86.1 87.7  PLT 158 154   Cardiac Enzymes: No results for input(s): CKTOTAL, CKMB, CKMBINDEX, TROPONINI in the last 168 hours. BNP: BNP (last 3 results) No results for input(s): PROBNP in the last 8760 hours. CBG: No results for input(s): GLUCAP in the last 168 hours.     Signed:  MEMON,JEHANZEB  [facility] 11/04/2014, 6:26 PM

## 2014-11-07 ENCOUNTER — Telehealth: Payer: Self-pay | Admitting: Family Medicine

## 2014-11-07 DIAGNOSIS — E876 Hypokalemia: Secondary | ICD-10-CM | POA: Diagnosis not present

## 2014-11-07 LAB — BASIC METABOLIC PANEL
BUN: 7 mg/dL (ref 6–23)
CO2: 27 meq/L (ref 19–32)
Calcium: 9 mg/dL (ref 8.4–10.5)
Chloride: 104 mEq/L (ref 96–112)
Creat: 0.8 mg/dL (ref 0.50–1.35)
GLUCOSE: 146 mg/dL — AB (ref 70–99)
POTASSIUM: 3.8 meq/L (ref 3.5–5.3)
SODIUM: 141 meq/L (ref 135–145)

## 2014-11-07 NOTE — Telephone Encounter (Signed)
Patient notified that bloodwork has been ordered per Dr. Nicki Reaper. Patient verbalized understanding.

## 2014-11-07 NOTE — Telephone Encounter (Signed)
Pt was told by Dr Nicki Reaper after a stay in the ER over christmas to go to [facility]  Today to get some more blood work done due to his potassium being really low  Please have this sent so the pt can go get it done today   [phone] call this number to tell him its been sent

## 2014-11-08 ENCOUNTER — Encounter: Payer: Self-pay | Admitting: Cardiovascular Disease

## 2014-11-08 ENCOUNTER — Other Ambulatory Visit: Payer: Self-pay

## 2014-11-08 DIAGNOSIS — E876 Hypokalemia: Secondary | ICD-10-CM

## 2014-11-08 LAB — CULTURE, BLOOD (ROUTINE X 2)
Culture: NO GROWTH
Culture: NO GROWTH

## 2014-11-09 LAB — LEGIONELLA ANTIGEN, URINE

## 2014-11-14 ENCOUNTER — Other Ambulatory Visit (HOSPITAL_COMMUNITY): Payer: Medicare Other

## 2014-11-14 ENCOUNTER — Encounter (HOSPITAL_COMMUNITY): Payer: Medicare Other | Attending: Oncology

## 2014-11-14 DIAGNOSIS — C9001 Multiple myeloma in remission: Secondary | ICD-10-CM | POA: Diagnosis not present

## 2014-11-14 DIAGNOSIS — C9000 Multiple myeloma not having achieved remission: Secondary | ICD-10-CM | POA: Diagnosis not present

## 2014-11-14 LAB — CBC WITH DIFFERENTIAL/PLATELET
BASOS PCT: 1 % (ref 0–1)
Basophils Absolute: 0 10*3/uL (ref 0.0–0.1)
Eosinophils Absolute: 0.1 10*3/uL (ref 0.0–0.7)
Eosinophils Relative: 2 % (ref 0–5)
HEMATOCRIT: 34.9 % — AB (ref 39.0–52.0)
Hemoglobin: 11.6 g/dL — ABNORMAL LOW (ref 13.0–17.0)
LYMPHS ABS: 1.6 10*3/uL (ref 0.7–4.0)
Lymphocytes Relative: 30 % (ref 12–46)
MCH: 29.2 pg (ref 26.0–34.0)
MCHC: 33.2 g/dL (ref 30.0–36.0)
MCV: 87.9 fL (ref 78.0–100.0)
MONOS PCT: 11 % (ref 3–12)
Monocytes Absolute: 0.6 10*3/uL (ref 0.1–1.0)
NEUTROS ABS: 3 10*3/uL (ref 1.7–7.7)
NEUTROS PCT: 56 % (ref 43–77)
Platelets: 141 10*3/uL — ABNORMAL LOW (ref 150–400)
RBC: 3.97 MIL/uL — ABNORMAL LOW (ref 4.22–5.81)
RDW: 15.5 % (ref 11.5–15.5)
WBC: 5.3 10*3/uL (ref 4.0–10.5)

## 2014-11-14 NOTE — Progress Notes (Signed)
Labs for cbcd 

## 2014-11-15 ENCOUNTER — Telehealth: Payer: Self-pay | Admitting: *Deleted

## 2014-11-15 NOTE — Telephone Encounter (Signed)
Returned order to [facility] for CPAP machine.

## 2014-11-17 DIAGNOSIS — C44229 Squamous cell carcinoma of skin of left ear and external auricular canal: Secondary | ICD-10-CM | POA: Diagnosis not present

## 2014-11-17 DIAGNOSIS — N3000 Acute cystitis without hematuria: Secondary | ICD-10-CM | POA: Diagnosis not present

## 2014-11-17 DIAGNOSIS — N390 Urinary tract infection, site not specified: Secondary | ICD-10-CM | POA: Diagnosis not present

## 2014-11-17 DIAGNOSIS — J181 Lobar pneumonia, unspecified organism: Secondary | ICD-10-CM | POA: Diagnosis not present

## 2014-11-18 ENCOUNTER — Ambulatory Visit (HOSPITAL_COMMUNITY)
Admission: RE | Admit: 2014-11-18 | Discharge: 2014-11-18 | Disposition: A | Discharge status: Home / Self Care | Payer: Medicare Other | Source: Ambulatory Visit | Attending: Family Medicine | Admitting: Family Medicine

## 2014-11-18 ENCOUNTER — Ambulatory Visit (INDEPENDENT_AMBULATORY_CARE_PROVIDER_SITE_OTHER): Payer: Medicare Other | Admitting: Family Medicine

## 2014-11-18 ENCOUNTER — Encounter: Payer: Self-pay | Admitting: Family Medicine

## 2014-11-18 VITALS — BP 122/80 | Temp 98.5°F | Ht 68.0 in | Wt 178.0 lb

## 2014-11-18 DIAGNOSIS — W19XXXA Unspecified fall, initial encounter: Secondary | ICD-10-CM | POA: Diagnosis not present

## 2014-11-18 DIAGNOSIS — R0602 Shortness of breath: Secondary | ICD-10-CM | POA: Insufficient documentation

## 2014-11-18 DIAGNOSIS — R0781 Pleurodynia: Secondary | ICD-10-CM | POA: Insufficient documentation

## 2014-11-18 DIAGNOSIS — S299XXA Unspecified injury of thorax, initial encounter: Secondary | ICD-10-CM | POA: Diagnosis not present

## 2014-11-18 MED ORDER — OXYCODONE-ACETAMINOPHEN 10-325 MG PO TABS: ORAL_TABLET | ORAL | Status: DC

## 2014-11-18 NOTE — Progress Notes (Signed)
   Subjective:    Patient ID: Jeremy Parrish, male    DOB: August 14, 1941, 74 y.o.   MRN: [id number]  Back Pain This is a new problem. The current episode started in the past 7 days. The problem occurs constantly. The problem is unchanged. The pain is present in the lumbar spine. The pain is moderate. The pain is the same all the time. Stiffness is present all day. Associated symptoms include a fever. He has tried nothing for the symptoms. The treatment provided no relief.   Patient states he has no concerns at this time.  Patient states he took a fall 3 days ago going down some steps his ribs hit the hand rail  Review of Systems  Constitutional: Positive for fever.  Musculoskeletal: Positive for back pain.       Objective:   Physical Exam  Constitutional: He appears well-nourished. No distress.  Cardiovascular: Normal rate, regular rhythm and normal heart sounds.   No murmur heard. Pulmonary/Chest: Effort normal and breath sounds normal. No respiratory distress.  Musculoskeletal: He exhibits no edema.  Lymphadenopathy:    He has no cervical adenopathy.  Neurological: He is alert.  Psychiatric: His behavior is normal.  Vitals reviewed.         Assessment & Plan:  This patient has rib pain on the left side due to a fall that happened 3 days ago. He states pain discomfort with deep breath. X-rays ordered. X-rays were negative. This is straining muscles related to the fall. Patient was given prescription pain medicine he could use over the next few days then he is to resume his usual medicine he denies abusing medicine

## 2014-11-28 ENCOUNTER — Other Ambulatory Visit (HOSPITAL_COMMUNITY): Payer: Self-pay | Admitting: Oncology

## 2014-11-28 DIAGNOSIS — C9000 Multiple myeloma not having achieved remission: Secondary | ICD-10-CM

## 2014-11-28 MED ORDER — LENALIDOMIDE 10 MG PO CAPS: 10.0000 mg | ORAL_CAPSULE | Freq: Every day | ORAL | Status: DC

## 2014-12-01 LAB — MDC_IDC_ENUM_SESS_TYPE_REMOTE
Battery Voltage: 3 V
Brady Statistic AP VS Percent: 90.8 %
Brady Statistic AS VP Percent: 0.06 %
Brady Statistic AS VS Percent: 7.34 %
Brady Statistic RV Percent Paced: 1.85 %
Date Time Interrogation Session: 20151215223655
Lead Channel Impedance Value: 384 Ohm
Lead Channel Impedance Value: 424 Ohm
Lead Channel Sensing Intrinsic Amplitude: 2.5724
Lead Channel Sensing Intrinsic Amplitude: 5.0496
Lead Channel Setting Pacing Amplitude: 2.5 V
Lead Channel Setting Pacing Pulse Width: 0.6 ms
Lead Channel Setting Sensing Sensitivity: 0.9 mV
MDC IDC SET LEADCHNL RA PACING AMPLITUDE: 2 V
MDC IDC STAT BRADY AP VP PERCENT: 1.8 %
MDC IDC STAT BRADY RA PERCENT PACED: 92.6 %
Zone Setting Detection Interval: 150 ms
Zone Setting Detection Interval: 400 ms

## 2014-12-01 NOTE — Progress Notes (Unsigned)
[facility] express transmission w/Dr.Kelly (n/c). Device function reviewed. Impedance and sensing consistent with previous measurements. Histograms appropriate for patient and level of activity. All other diagnostic data reviewed and is appropriate and stable for patient. Real time EGM shows appropriate sensing and capture. No mode switches. 2 NSVT episodes---max dur. 10 bts, Max Avg V 169. Batt voltage 3.00V (ERI 2.81V). Plan to follow up with [facility] in 3 months.

## 2014-12-06 ENCOUNTER — Encounter: Payer: Self-pay | Admitting: Family Medicine

## 2014-12-06 ENCOUNTER — Ambulatory Visit (INDEPENDENT_AMBULATORY_CARE_PROVIDER_SITE_OTHER): Payer: Medicare Other | Admitting: Family Medicine

## 2014-12-06 ENCOUNTER — Telehealth: Payer: Self-pay | Admitting: Cardiology

## 2014-12-06 VITALS — BP 136/80 | Ht 68.0 in | Wt 176.0 lb

## 2014-12-06 DIAGNOSIS — E876 Hypokalemia: Secondary | ICD-10-CM | POA: Diagnosis not present

## 2014-12-06 DIAGNOSIS — G4733 Obstructive sleep apnea (adult) (pediatric): Secondary | ICD-10-CM

## 2014-12-06 DIAGNOSIS — E785 Hyperlipidemia, unspecified: Secondary | ICD-10-CM | POA: Diagnosis not present

## 2014-12-06 DIAGNOSIS — I10 Essential (primary) hypertension: Secondary | ICD-10-CM

## 2014-12-06 MED ORDER — CITALOPRAM HYDROBROMIDE 40 MG PO TABS: 40.0000 mg | ORAL_TABLET | Freq: Every day | ORAL | Status: DC

## 2014-12-06 NOTE — Progress Notes (Signed)
   Subjective:    Patient ID: Jeremy Parrish, male    DOB: 05-04-1941, 74 y.o.   MRN: [id number]  HPI Patient is here today for a check up.  He got BW done by [place] and by the hospital.  He was in the hospital for hypokalemia.   Pt said he feels a lot better now.   Patient claims compliance with his potassium medication. States he has more energy now. Did not get the follow-up blood work because he feels he does not needed.      Review of Systems No headache no chest pain no back pain    Objective:   Physical Exam  Alert blood pressure good. HEENT normal. Lungs clear. Heart regular in rhythm.      Assessment & Plan:  Impression 1 hypokalemia resolved #2 hypertension good control #3 hyperlipidemia discussed #4 depression patient on Celexa. Compliant with medications. Plan diet discussed. Exercise discussed compliance with medicines discussed meds refilled. WSL

## 2014-12-06 NOTE — Telephone Encounter (Signed)
Spoke w/ pt and pt informed me that he was not coming to office on 12-09-14 to upgrade home monitor.

## 2014-12-08 ENCOUNTER — Other Ambulatory Visit (HOSPITAL_COMMUNITY): Payer: Self-pay

## 2014-12-08 DIAGNOSIS — C9001 Multiple myeloma in remission: Secondary | ICD-10-CM

## 2014-12-08 DIAGNOSIS — Z8572 Personal history of non-Hodgkin lymphomas: Secondary | ICD-10-CM

## 2014-12-09 ENCOUNTER — Encounter (HOSPITAL_BASED_OUTPATIENT_CLINIC_OR_DEPARTMENT_OTHER): Payer: Medicare Other

## 2014-12-09 ENCOUNTER — Encounter (HOSPITAL_BASED_OUTPATIENT_CLINIC_OR_DEPARTMENT_OTHER): Payer: Medicare Other | Admitting: Hematology & Oncology

## 2014-12-09 ENCOUNTER — Encounter (HOSPITAL_COMMUNITY): Payer: Self-pay | Admitting: Hematology & Oncology

## 2014-12-09 ENCOUNTER — Other Ambulatory Visit (HOSPITAL_COMMUNITY): Payer: Medicare Other

## 2014-12-09 VITALS — BP 139/75 | HR 74 | Temp 97.9°F | Resp 18 | Wt 180.0 lb

## 2014-12-09 DIAGNOSIS — D801 Nonfamilial hypogammaglobulinemia: Secondary | ICD-10-CM | POA: Diagnosis not present

## 2014-12-09 DIAGNOSIS — C9001 Multiple myeloma in remission: Secondary | ICD-10-CM | POA: Diagnosis not present

## 2014-12-09 DIAGNOSIS — G8929 Other chronic pain: Secondary | ICD-10-CM

## 2014-12-09 DIAGNOSIS — Z859 Personal history of malignant neoplasm, unspecified: Secondary | ICD-10-CM | POA: Diagnosis not present

## 2014-12-09 DIAGNOSIS — Z8572 Personal history of non-Hodgkin lymphomas: Secondary | ICD-10-CM

## 2014-12-09 DIAGNOSIS — M12811 Other specific arthropathies, not elsewhere classified, right shoulder: Secondary | ICD-10-CM

## 2014-12-09 DIAGNOSIS — M75101 Unspecified rotator cuff tear or rupture of right shoulder, not specified as traumatic: Principal | ICD-10-CM

## 2014-12-09 DIAGNOSIS — C9000 Multiple myeloma not having achieved remission: Secondary | ICD-10-CM | POA: Diagnosis not present

## 2014-12-09 LAB — CBC WITH DIFFERENTIAL/PLATELET
BASOS ABS: 0.1 10*3/uL (ref 0.0–0.1)
Basophils Relative: 2 % — ABNORMAL HIGH (ref 0–1)
Eosinophils Absolute: 0.3 10*3/uL (ref 0.0–0.7)
Eosinophils Relative: 12 % — ABNORMAL HIGH (ref 0–5)
HCT: 35.6 % — ABNORMAL LOW (ref 39.0–52.0)
Hemoglobin: 11.2 g/dL — ABNORMAL LOW (ref 13.0–17.0)
LYMPHS ABS: 1.1 10*3/uL (ref 0.7–4.0)
LYMPHS PCT: 45 % (ref 12–46)
MCH: 27.9 pg (ref 26.0–34.0)
MCHC: 31.5 g/dL (ref 30.0–36.0)
MCV: 88.6 fL (ref 78.0–100.0)
Monocytes Absolute: 0.2 10*3/uL (ref 0.1–1.0)
Monocytes Relative: 8 % (ref 3–12)
NEUTROS PCT: 33 % — AB (ref 43–77)
Neutro Abs: 0.8 10*3/uL — ABNORMAL LOW (ref 1.7–7.7)
PLATELETS: 90 10*3/uL — AB (ref 150–400)
RBC: 4.02 MIL/uL — ABNORMAL LOW (ref 4.22–5.81)
RDW: 15.7 % — AB (ref 11.5–15.5)
WBC: 2.5 10*3/uL — ABNORMAL LOW (ref 4.0–10.5)

## 2014-12-09 LAB — COMPREHENSIVE METABOLIC PANEL
ALT: 14 U/L (ref 0–53)
AST: 20 U/L (ref 0–37)
Albumin: 3.6 g/dL (ref 3.5–5.2)
Alkaline Phosphatase: 78 U/L (ref 39–117)
Anion gap: 4 — ABNORMAL LOW (ref 5–15)
BUN: 8 mg/dL (ref 6–23)
CHLORIDE: 109 mmol/L (ref 96–112)
CO2: 25 mmol/L (ref 19–32)
Calcium: 8.7 mg/dL (ref 8.4–10.5)
Creatinine, Ser: 0.89 mg/dL (ref 0.50–1.35)
GFR, EST NON AFRICAN AMERICAN: 83 mL/min — AB (ref >90)
Glucose, Bld: 88 mg/dL (ref 70–99)
POTASSIUM: 3.7 mmol/L (ref 3.5–5.1)
SODIUM: 138 mmol/L (ref 135–145)
Total Bilirubin: 0.5 mg/dL (ref 0.3–1.2)
Total Protein: 6.7 g/dL (ref 6.0–8.3)

## 2014-12-09 MED ORDER — HEPARIN SOD (PORK) LOCK FLUSH 100 UNIT/ML IV SOLN
500.0000 [IU] | Freq: Once | INTRAVENOUS | Status: AC | PRN
  Administered 2014-12-09: 500 [IU]
  Filled 2014-12-09: qty 5

## 2014-12-09 MED ORDER — IMMUNE GLOBULIN (HUMAN) 10 GM/100ML IV SOLN
0.4000 g/kg | Freq: Once | INTRAVENOUS | Status: AC
  Administered 2014-12-09: 35 g via INTRAVENOUS
  Filled 2014-12-09: qty 200

## 2014-12-09 MED ORDER — ACETAMINOPHEN 325 MG PO TABS
ORAL_TABLET | ORAL | Status: AC
  Filled 2014-12-09: qty 2

## 2014-12-09 MED ORDER — SODIUM CHLORIDE 0.9 % IJ SOLN
10.0000 mL | INTRAMUSCULAR | Status: AC | PRN
  Administered 2014-12-09: 10 mL
  Filled 2014-12-09: qty 10

## 2014-12-09 MED ORDER — ACETAMINOPHEN 325 MG PO TABS
650.0000 mg | ORAL_TABLET | Freq: Four times a day (QID) | ORAL | Status: DC | PRN
  Administered 2014-12-09: 650 mg via ORAL

## 2014-12-09 MED ORDER — CELECOXIB 100 MG PO CAPS: 100.0000 mg | ORAL_CAPSULE | Freq: Two times a day (BID) | ORAL | Status: DC

## 2014-12-09 MED ORDER — SODIUM CHLORIDE 0.9 % IV SOLN
Freq: Once | INTRAVENOUS | Status: AC
  Administered 2014-12-09: 09:00:00 via INTRAVENOUS

## 2014-12-09 NOTE — Patient Instructions (Signed)
[place] at [facility]  Discharge Instructions: I would like to see you back in one month since we started a new medication CELEBREX Please call in the interim with any problems or concerns _______________________________________________________________  Thank you for choosing [place] at [facility] to provide your oncology and hematology care.  To afford each patient quality time with our providers, please arrive at least 15 minutes before your scheduled appointment.  You need to re-schedule your appointment if you arrive 10 or more minutes late.  We strive to give you quality time with our providers, and arriving late affects you and other patients whose appointments are after yours.  Also, if you no show three or more times for appointments you may be dismissed from the clinic.  Again, thank you for choosing [place] at [facility] hope is that these requests will allow you access to exceptional care and in a timely manner. _______________________________________________________________  If you have questions after your visit, please contact our office at [phone] between the hours of 8:30 a.m. and 5:00 p.m. Voicemails left after 4:30 p.m. will not be returned until the following business day. _______________________________________________________________  For prescription refill requests, have your pharmacy contact our office. _______________________________________________________________  Recommendations made by the consultant and any test results will be sent to your referring physician. _______________________________________________________________

## 2014-12-09 NOTE — Progress Notes (Signed)
LABS FOR CBCD,CMP,CRP,MM,KLLC,B2M

## 2014-12-09 NOTE — Progress Notes (Signed)
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 Marcell Anger. 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 In 2011 he was diagnosed with IgG lambda multiple myeloma and treated with 4 cycles of Velcade and dexamethasone followed by autologous peripheral blood stem cell transplant and currently remains on maintenance Revlimid and also monthly intravenous IgG plus Zometa 4 mg intravenously monthly, last treatment on 08/18/2014 with Zometa.   DIAGNOSIS: IgG lambda Myeloma   CURRENT THERAPY:  IVIG        Revlimid 10 mg 7 on/7 off        Zometa X 5 years, now discontinued  INTERVAL HISTORY: Jeremy Parrish 74 y.o. male returns for follow-up of his myeloma and hypogammagloulenemia.  His major complaint revolves around his rotator cuffs and chronic pain.  He has oxycodone but takes ibuprofen as well.  He is concerned about mixing medications.    He fell at church and broke a rib a few weeks ago, he did not have an x-ray but states he has broken a rib before and knows how it feels.   MEDICAL HISTORY: Past Medical History  Diagnosis Date  . Hypertension   . Heart disease   . Kidney stones     history  . Lung mass   . Heart murmur   . 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 Coronary artery disease   . Depression   . Shortness of breath   . Peripheral arterial disease   . Bladder neck contracture   . Personal history of other diseases of circulatory system   . Aortic aneurysm of unspecified site without mention of rupture   . Arthritis   . Intestinovesical fistula   . Esophageal reflux   . Hyperlipidemia   .  Anemia   . CHF (congestive heart failure)   . COPD (chronic obstructive pulmonary disease)   . Cerebral atherosclerosis     Carotid Doppler, 02/16/2013 - Bilateral Proximal ICAs,demonstrate mild plaque w/o evidence of significant diameter reduction, dissection, or any other vascular abnormality  . Complication of anesthesia   . PONV (postoperative nausea and vomiting)   . Stroke 2013    Speech.  . Hx of bladder cancer 10/07/2012  . Sleep apnea     05-02-14 cpap , not yet used- suggested settings 5  . Cancer   . Prostate cancer 2000  . Multiple myeloma   . Non Hodgkin's lymphoma   . 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; H/O cardiac pacemaker, Medtronic REVO, MRI conditional device, placed 07/2011 for sympyomatic bradycardia; Hx of bladder cancer; Hyperlipidemia with target LDL less than 100; Prediabetes; Expressive aphasia; Hemiplegia affecting right dominant side; Aneurysm of iliac artery; Shortness of breath; Rotator cuff tear arthropathy of right shoulder; Left rotator cuff tear arthropathy; Muscle weakness (generalized); Status post arthroscopy  of shoulder; Pain in joint, shoulder region; Fever; Pneumonia; Pancytopenia; Chest pain with moderate risk for cardiac etiology; Inguinal hernia; Obstructive sleep apnea; Central sleep apnea; Hypokalemia; URI (upper respiratory infection); and Weakness on his problem list.     is allergic to diphenhydramine hcl; morphine and related; and tape.  Jeremy Parrish does not currently have medications on file.  SURGICAL HISTORY: Past Surgical History  Procedure Laterality  Date  . Prostate surgery    . Heart stents x 5  1999  . Portacath placement  07/26/2009    right chest  . Wrist surgery      right  . Left ear skin cancer removed    . Bone marrow transplant  2011  . Pacemaker insertion  07/22/2011    Medtronic  . Coronary angioplasty  06/24/2000    PCI and stenting in mid & proximal RCA  . Insert / replace / remove pacemaker    . Tee without cardioversion  10/13/2012    Procedure: TRANSESOPHAGEAL ECHOCARDIOGRAM (TEE);  Surgeon: Sanda Klein, MD;  Location: [facility];  Service: Cardiovascular;  Laterality: N/A;  pat/kay/echo notified  . Colonoscopy N/A 01/01/2013    Procedure: COLONOSCOPY;  Surgeon: Rogene Houston, MD;  Location: [facility];  Service: Endoscopy;  Laterality: N/A;  825-moved to [place] notified pt  . Rotator    . Rotator cuff repair Right   . Colon surgery      colon resection  . Bladder surgery    . Shoulder arthroscopy with subacromial decompression Right 07/21/2013    Procedure: RIGHT SHOULDER ARTHROSCOPY WITH SUBACROMIAL DECOMPRESSION AND DEBRIDEMENT & Injection of Left Shoulder;  Surgeon: Alta Corning, MD;  Location: [facility];  Service: Orthopedics;  Laterality: Right;  . US echocardiography  06/19/2011    RV mildly dilated,mild to mod. MR,mild AI,mild PI  . Nm myocar perf wall motion  11/27/2007    inferior scar  . Inguinal hernia repair Right 05/04/2014    Procedure: OPEN RIGHT INGUINAL HERNIA REPAIR with mesh;  Surgeon: Edward Jolly, MD;  Location: [facility];  Service: General;  Laterality: Right;    SOCIAL HISTORY: History   Social History  . Marital Status: Married    Spouse Name: [name]  . Number of Children: 3  . Years of Education: 9th   Occupational History  . retired     Social History Main Topics  . Smoking status: Former Smoker -- 1.00 packs/day for 20 years    Types: Cigarettes    Quit date: 11/14/1994  . Smokeless tobacco: Never Used  . Alcohol Use: No     Comment: previously drank but none for  at least 15 years.  . Drug Use: No  . Sexual Activity: Not on file   Other Topics Concern  . Not on file   Social History Narrative   Patient lives at home spouse.   Caffeine Use: Occasionally    FAMILY HISTORY: Family History  Problem Relation Age of Onset  . Colon cancer Neg Hx   . Colon polyps Neg Hx   . Cancer Father     bladder  . Heart disease Father     before age 40  . Hypertension Mother   . Cancer Brother   . Heart disease Brother     before age 68  . Heart disease Sister     before age 65  . Hyperlipidemia Sister   . Hypertension Sister   . Heart attack Sister     Review  of Systems  Constitutional: Negative.   HENT: Negative.   Eyes: Negative.   Respiratory: Negative.   Cardiovascular: Negative.   Gastrointestinal: Negative.   Genitourinary: Negative.   Musculoskeletal: Positive for joint pain.  Skin: Negative.   Neurological: Positive for speech change. Negative for dizziness, tingling, tremors and sensory change.       History of CVA, with chronic change in his speech  Endo/Heme/Allergies: Negative.   Psychiatric/Behavioral: Negative.     PHYSICAL EXAMINATION  ECOG PERFORMANCE STATUS: 1 - Symptomatic but completely ambulatory  Filed Vitals:   12/09/14 0843  BP: 139/75  Pulse: 74  Temp: 97.9 F (36.6 C)  Resp: 18    Physical Exam  Constitutional: He is oriented to person, place, and time and well-developed, well-nourished, and in no distress.  HENT:  Head: Normocephalic and atraumatic.  Nose: Nose normal.  Mouth/Throat: Oropharynx is clear and moist. No oropharyngeal exudate.  Eyes: Conjunctivae and EOM are normal. Pupils are equal, round, and reactive to light. Right eye exhibits no discharge. Left eye exhibits no discharge. No scleral icterus.  Neck: Normal range of motion. Neck supple. No tracheal deviation present. No thyromegaly present.  Cardiovascular: Normal rate, regular rhythm and normal heart sounds.  Exam reveals no gallop  and no friction rub.   No murmur heard. Pulmonary/Chest: Effort normal and breath sounds normal. He has no wheezes. He has no rales.  Abdominal: Soft. Bowel sounds are normal. He exhibits no distension and no mass. There is no tenderness. There is no rebound and no guarding.  Musculoskeletal: Normal range of motion. He exhibits no edema.  Lymphadenopathy:    He has no cervical adenopathy.  Neurological: He is alert and oriented to person, place, and time. He has normal reflexes. No cranial nerve deficit. Gait normal. Coordination normal.  Skin: Skin is warm and dry. No rash noted.  Psychiatric: Mood, memory, affect and judgment normal.  Nursing note and vitals reviewed.   LABORATORY DATA:  CBC    Component Value Date/Time   WBC 2.5* 12/09/2014 0828   RBC 4.02* 12/09/2014 0828   HGB 11.2* 12/09/2014 0828   HCT 35.6* 12/09/2014 0828   PLT 90* 12/09/2014 0828   MCV 88.6 12/09/2014 0828   MCH 27.9 12/09/2014 0828   MCHC 31.5 12/09/2014 0828   RDW 15.7* 12/09/2014 0828   LYMPHSABS 1.1 12/09/2014 0828   MONOABS 0.2 12/09/2014 0828   EOSABS 0.3 12/09/2014 0828   BASOSABS 0.1 12/09/2014 0828   CMP     Component Value Date/Time   NA 138 12/09/2014 0828   K 3.7 12/09/2014 0828   CL 109 12/09/2014 0828   CO2 25 12/09/2014 0828   GLUCOSE 88 12/09/2014 0828   BUN 8 12/09/2014 0828   CREATININE 0.89 12/09/2014 0828   CREATININE 0.80 11/07/2014 1034   CALCIUM 8.7 12/09/2014 0828   PROT 6.7 12/09/2014 0828   ALBUMIN 3.6 12/09/2014 0828   AST 20 12/09/2014 0828   ALT 14 12/09/2014 0828   ALKPHOS 78 12/09/2014 0828   BILITOT 0.5 12/09/2014 0828   GFRNONAA 83* 12/09/2014 0828   GFRAA >90 12/09/2014 0828      ASSESSMENT and THERAPY PLAN:    Multiple myeloma in remission Present 74 year old male with a history of IgG lambda multiple myeloma status post stem cell transplant in October 2011. He has been on maintenance Revlimid 10 mg for 7 days on and 7 days off. Recent labs have  revealed some concern about potential recurrence, but need to be  monitored. CBC today shows worsening white count and platelet count which may only be secondary to ongoing Revlimid therapy but certainly recurrent disease has to be considered a possibility. We will see where his counts stand and his myeloma studies are at his next visit. If there is additional concern about relapse we will recommend proceeding with bone marrow biopsy if the patient is willing.   Hypogammaglobulinemia He is tolerating IVIG without any difficulty. He has known hypogammaglobulinemia. 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   Hx of lymphoma He has a history of marginal zone lymphoma dating back to 2007 and was treated with 6 cycles of R CHOP. I am currently not concerned about recurrent marginal zone lymphoma. We will continue with ongoing observation.    All questions were answered. The patient knows to call the clinic with any problems, questions or concerns. We can certainly see the patient much sooner if necessary.  Molli Hazard 12/24/2014

## 2014-12-09 NOTE — Progress Notes (Unsigned)
Tolerated IVIG infusion well.

## 2014-12-09 NOTE — Progress Notes (Unsigned)
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 Marcell Anger.  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 (Stage IVb) In 2011 he was diagnosed with IgG lambda multiple myeloma and treated with 4 cycles of Velcade and dexamethasone followed by autologous peripheral blood stem cell transplant and currently remains on maintenance Revlimid and also monthly intravenous IgG plus Zometa 4 mg intravenously monthly, last treatment on 08/18/2014 with Zometa.  No history exists.    CURRENT THERAPY: IVIG  INTERVAL HISTORY: RAJON BISIG 74 y.o. male returns for   MEDICAL HISTORY: Past Medical History  Diagnosis Date  . Hypertension   . Heart disease   . Kidney stones     history  . Lung mass   . Heart murmur   . 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 Coronary artery disease   . Depression   . Shortness of breath   . Peripheral arterial disease   . Bladder neck contracture   . Personal history of other diseases of circulatory system   . Aortic aneurysm of unspecified site without mention of rupture   . Arthritis   . Intestinovesical fistula   . Esophageal reflux   . Hyperlipidemia   . Anemia   . CHF (congestive heart failure)   . COPD (chronic obstructive pulmonary disease)   . Cerebral atherosclerosis     Carotid Doppler, 02/16/2013 - Bilateral Proximal ICAs,demonstrate mild plaque w/o evidence of significant diameter reduction, dissection, or any  other vascular abnormality  . Complication of anesthesia   . PONV (postoperative nausea and vomiting)   . Stroke 2013    Speech.  . Hx of bladder cancer 10/07/2012  . Sleep apnea     05-02-14 cpap , not yet used- suggested settings 5  . Cancer   . Prostate cancer 2000  . Multiple myeloma   . Non Hodgkin's lymphoma   . 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; H/O cardiac pacemaker, Medtronic REVO, MRI conditional device, placed 07/2011 for sympyomatic bradycardia; Hx of bladder cancer; Hyperlipidemia with target LDL less than 100; Prediabetes; Expressive aphasia; Hemiplegia affecting right dominant side; Aneurysm of iliac artery; Shortness of breath; Rotator cuff tear arthropathy of right shoulder; Left rotator cuff tear arthropathy; Muscle weakness (generalized); Status post arthroscopy of shoulder; Pain in joint, shoulder region; Fever; Pneumonia; Pancytopenia; Chest pain with moderate risk for cardiac etiology; Inguinal hernia; Obstructive sleep apnea; Central sleep apnea; Hypokalemia; URI (upper respiratory infection); and Weakness on his problem list.     is allergic to diphenhydramine hcl; morphine and related; and tape.  Mr. Premo does not currently have medications on file.  SURGICAL HISTORY:  Past Surgical History  Procedure Laterality Date  . Prostate surgery    . Heart stents x 5  1999  . Portacath placement  07/26/2009    right chest  . Wrist surgery      right  . Left ear skin cancer removed    . Bone marrow transplant  2011  . Pacemaker insertion  07/22/2011    Medtronic  . Coronary  angioplasty  06/24/2000    PCI and stenting in mid & proximal RCA  . Insert / replace / remove pacemaker    . Tee without cardioversion  10/13/2012    Procedure: TRANSESOPHAGEAL ECHOCARDIOGRAM (TEE);  Surgeon: Sanda Klein, MD;  Location: [facility];  Service: Cardiovascular;  Laterality: N/A;  pat/kay/echo notified  . Colonoscopy N/A 01/01/2013    Procedure: COLONOSCOPY;  Surgeon: Rogene Houston, MD;  [facility];  Service: Endoscopy;  Laterality: N/A;  825-moved to [place] notified pt  . Rotator    . Rotator cuff repair Right   . Colon surgery      colon resection  . Bladder surgery    . Shoulder arthroscopy with subacromial decompression Right 07/21/2013    Procedure: RIGHT SHOULDER ARTHROSCOPY WITH SUBACROMIAL DECOMPRESSION AND DEBRIDEMENT & Injection of Left Shoulder;  Surgeon: Alta Corning, MD;  Location: [facility];  Service: Orthopedics;  Laterality: Right;  . US echocardiography  06/19/2011    RV mildly dilated,mild to mod. MR,mild AI,mild PI  . Nm myocar perf wall motion  11/27/2007    inferior scar  . Inguinal hernia repair Right 05/04/2014    Procedure: OPEN RIGHT INGUINAL HERNIA REPAIR with mesh;  Surgeon: Edward Jolly, MD;  Location: [facility];  Service: General;  Laterality: Right;    SOCIAL HISTORY: History   Social History  . Marital Status: Married    Spouse Name: [name]    Number of Children: 3  . Years of Education: 9th   Occupational History  . retired     Social History Main Topics  . Smoking status: Former Smoker -- 1.00 packs/day for 20 years    Types: Cigarettes    Quit date: 11/14/1994  . Smokeless tobacco: Never Used  . Alcohol Use: No     Comment: previously drank but none for at least 15 years.  . Drug Use: No  . Sexual Activity: Not on file   Other Topics Concern  . Not on file   Social History Narrative   Patient lives at home spouse.   Caffeine Use: Occasionally    FAMILY HISTORY: Family History  Problem Relation Age of  Onset  . Colon cancer Neg Hx   . Colon polyps Neg Hx   . Cancer Father     bladder  . Heart disease Father     before age 62  . Hypertension Mother   . Cancer Brother   . Heart disease Brother     before age 65  . Heart disease Sister     before age 57  . Hyperlipidemia Sister   . Hypertension Sister   . Heart attack Sister     ROS  PHYSICAL EXAMINATION  ECOG PERFORMANCE STATUS: {CHL ONC ECOG ZO:[id number]}  There were no vitals filed for this visit.  Physical Exam  LABORATORY DATA:  CBC    Component Value Date/Time   WBC 5.3 11/14/2014 0854   RBC 3.97* 11/14/2014 0854   HGB 11.6* 11/14/2014 0854   HCT 34.9* 11/14/2014 0854   PLT 141* 11/14/2014  0854   MCV 87.9 11/14/2014 0854   MCH 29.2 11/14/2014 0854   MCHC 33.2 11/14/2014 0854   RDW 15.5 11/14/2014 0854   LYMPHSABS 1.6 11/14/2014 0854   MONOABS 0.6 11/14/2014 0854   EOSABS 0.1 11/14/2014 0854   BASOSABS 0.0 11/14/2014 0854   CMP     Component Value Date/Time   NA 141 11/07/2014 1034   K 3.8 11/07/2014 1034   CL 104 11/07/2014 1034   CO2 27 11/07/2014 1034   GLUCOSE 146* 11/07/2014 1034   BUN 7 11/07/2014 1034   CREATININE 0.80 11/07/2014 1034   CREATININE 0.79 11/04/2014 0610   CALCIUM 9.0 11/07/2014 1034   PROT 5.8* 11/04/2014 0610   ALBUMIN 2.8* 11/04/2014 0610   AST 16 11/04/2014 0610   ALT 16 11/04/2014 0610   ALKPHOS 74 11/04/2014 0610   BILITOT 0.4 11/04/2014 0610   GFRNONAA 88* 11/04/2014 0610   GFRAA >90 11/04/2014 0610     PENDING LABS:   RADIOGRAPHIC STUDIES:  No results found.   PATHOLOGY:     ASSESSMENT and THERAPY PLAN:    No problem-specific assessment & plan notes found for this encounter.   All questions were answered. The patient knows to call the clinic with any problems, questions or concerns. We can certainly see the patient much sooner if necessary.  I spent {CHL ONC TIME VISIT - QZESP:[id number]} counseling the patient face to face. The total time  spent in the appointment was {CHL ONC TIME VISIT - JFHLK:[id number]}.  Molli Hazard 12/09/2014

## 2014-12-09 NOTE — Patient Instructions (Signed)
[place] at [facility] Discharge Instructions  RECOMMENDATIONS MADE BY THE CONSULTANT AND ANY TEST RESULTS WILL BE SENT TO YOUR REFERRING PHYSICIAN.  Today you received an IVIG infusion. Please return as scheduled.  Thank you for choosing [place] at [facility] to provide your oncology and hematology care.  To afford each patient quality time with our provider, please arrive at least 15 minutes before your scheduled appointment time.    You need to re-schedule your appointment should you arrive 10 or more minutes late.  We strive to give you quality time with our providers, and arriving late affects you and other patients whose appointments are after yours.  Also, if you no show three or more times for appointments you may be dismissed from the clinic at the providers discretion.     Again, thank you for choosing [facility].  Our hope is that these requests will decrease the amount of time that you wait before being seen by our physicians.       _____________________________________________________________  Should you have questions after your visit to [facility] please contact our office at [phone] between the hours of 8:30 a.m. and 4:30 p.m.  Voicemails left after 4:30 p.m. will not be returned until the following business day.  For prescription refill requests, have your pharmacy contact our office.

## 2014-12-10 LAB — BETA 2 MICROGLOBULIN, SERUM: Beta-2 Microglobulin: 1.9 mg/L — ABNORMAL HIGH (ref 0.6–2.4)

## 2014-12-10 LAB — C-REACTIVE PROTEIN: CRP: 0.7 mg/dL — ABNORMAL HIGH (ref <0.60)

## 2014-12-13 LAB — KAPPA/LAMBDA LIGHT CHAINS
Kappa free light chain: 43.2 mg/L — ABNORMAL HIGH (ref 3.30–19.40)
Kappa, lambda light chain ratio: 1.46 (ref 0.26–1.65)
LAMDA FREE LIGHT CHAINS: 29.53 mg/L — AB (ref 5.71–26.30)

## 2014-12-14 ENCOUNTER — Encounter: Payer: Self-pay | Admitting: Cardiovascular Disease

## 2014-12-15 DIAGNOSIS — S01302A Unspecified open wound of left ear, initial encounter: Secondary | ICD-10-CM | POA: Diagnosis not present

## 2014-12-21 LAB — MULTIPLE MYELOMA PANEL, SERUM
ALPHA-1-GLOBULIN: 7.2 % — AB (ref 2.9–4.9)
ALPHA-2-GLOBULIN: 11.7 % (ref 7.1–11.8)
Albumin ELP: 54.5 % — ABNORMAL LOW (ref 55.8–66.1)
Beta 2: 5.9 % (ref 3.2–6.5)
Beta Globulin: 6.9 % (ref 4.7–7.2)
GAMMA GLOBULIN: 13.8 % (ref 11.1–18.8)
IGA: 402 mg/dL — AB (ref 68–379)
IgG (Immunoglobin G), Serum: 858 mg/dL (ref 650–1600)
IgM, Serum: 27 mg/dL — ABNORMAL LOW (ref 41–251)
M-SPIKE, %: NOT DETECTED g/dL
Total Protein: 6.2 g/dL (ref 6.0–8.3)

## 2014-12-23 ENCOUNTER — Telehealth: Payer: Self-pay | Admitting: Family Medicine

## 2014-12-23 ENCOUNTER — Other Ambulatory Visit: Payer: Self-pay | Admitting: *Deleted

## 2014-12-23 ENCOUNTER — Telehealth: Payer: Self-pay | Admitting: Cardiovascular Disease

## 2014-12-23 DIAGNOSIS — C9000 Multiple myeloma not having achieved remission: Secondary | ICD-10-CM

## 2014-12-23 MED ORDER — AMLODIPINE BESYLATE 10 MG PO TABS: 10.0000 mg | ORAL_TABLET | Freq: Every morning | ORAL | Status: DC

## 2014-12-23 NOTE — Telephone Encounter (Signed)
Med sent. Pt notified.  

## 2014-12-23 NOTE — Telephone Encounter (Signed)
Pt is requesting a refill on his norvasc pt states that he is completely out.  [facility]

## 2014-12-24 NOTE — Assessment & Plan Note (Signed)
He is tolerating IVIG without any difficulty. He has known hypogammaglobulinemia. 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

## 2014-12-24 NOTE — Assessment & Plan Note (Signed)
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 I am currently not concerned about recurrent marginal zone lymphoma. We will continue with ongoing observation.

## 2014-12-24 NOTE — Assessment & Plan Note (Signed)
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 He has been on maintenance Revlimid 10 mg for 7 days on and 7 days off. Recent labs have revealed some concern about potential recurrence, but need to be monitored. CBC today shows worsening white count and platelet count which may only be secondary to ongoing Revlimid therapy but certainly recurrent disease has to be considered a possibility. We will see where his counts stand and his myeloma studies are at his next visit. If there is additional concern about relapse we will recommend proceeding with bone marrow biopsy if the patient is willing.

## 2014-12-27 ENCOUNTER — Encounter: Payer: Self-pay | Admitting: Cardiovascular Disease

## 2014-12-27 ENCOUNTER — Ambulatory Visit (INDEPENDENT_AMBULATORY_CARE_PROVIDER_SITE_OTHER): Payer: Medicare Other | Admitting: Cardiovascular Disease

## 2014-12-27 VITALS — BP 123/73 | HR 69 | Resp 16 | Ht 68.0 in | Wt 186.8 lb

## 2014-12-27 DIAGNOSIS — Z8679 Personal history of other diseases of the circulatory system: Secondary | ICD-10-CM

## 2014-12-27 DIAGNOSIS — R001 Bradycardia, unspecified: Secondary | ICD-10-CM

## 2014-12-27 DIAGNOSIS — Z79899 Other long term (current) drug therapy: Secondary | ICD-10-CM

## 2014-12-27 DIAGNOSIS — I2510 Atherosclerotic heart disease of native coronary artery without angina pectoris: Secondary | ICD-10-CM | POA: Diagnosis not present

## 2014-12-27 DIAGNOSIS — Z950 Presence of cardiac pacemaker: Secondary | ICD-10-CM

## 2014-12-27 DIAGNOSIS — E785 Hyperlipidemia, unspecified: Secondary | ICD-10-CM | POA: Diagnosis not present

## 2014-12-27 NOTE — Progress Notes (Signed)
Patient ID: Jeremy Parrish, male   DOB: [birth year], 74 y.o.   MRN: [id number]     Reason for office visit Pacemaker follow up, SSS, CAD  He was admitted to the hospital around [name] with severe hypokalemia. He has occasional dizziness, denies syncope or other CV complaints.  Pacemaker interrogation shows normal device function, 91% atrial paced, 2% ventricular paced. Activity > 4 hours/day. Two episodes of nonsustained VT in November and December, each 10-11 beats around 160 bpm (asymptomatic). No atrial fibrillation seen.  In November 2013, he had ischemic stroke with left cortical infarct treated with intravenous thrombolytics, manifesting primarily with severe expressive aphasia. He has made substantial progress and is able to communicate much better now. He also has a history of coronary artery disease that dates back almost 20 years. He has previously received stents to the right coronary artery and left circumflex coronary artery. He has mild ischemic cardiomyopathy with left ventricular ejection fraction around 45-50%. Nuclear study in January 2015 showed a large inferolateral scar without ischemia.  In 2012 he developed symptomatic bradycardia/chronotropic incompetence and received a dual-chamber permanent pacemaker (Medtronic Revo, MRI conditional). His pacemaker has not recorded any atrial flutter or fibrillation to explain his stroke. 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 He has hypogammaglobulinemia and receives periodic IVIG administration. He has also had bladder cancer. He had rotator cuff surgery in September 2014.   Allergies  Allergen Reactions  . Diphenhydramine Hcl     REACTION: "hyper"  . Morphine And Related Other (See  Comments)    hallucinations  . Tape Rash    Paper tape is ok    Current Outpatient Prescriptions  Medication Sig Dispense Refill  . albuterol (PROVENTIL HFA;VENTOLIN HFA) 108 (90 BASE) MCG/ACT inhaler Inhale 2 puffs into the lungs every 6 (six) hours as needed for wheezing or shortness of breath. 1 Inhaler 2  . ALPRAZolam (XANAX) 0.5 MG tablet Take 1 tablet (0.5 mg total) by mouth at bedtime as needed for anxiety. 90 tablet 1  . amLODipine (NORVASC) 10 MG tablet Take 1 tablet (10 mg total) by mouth every morning. 90 tablet 1  . aspirin EC 81 MG tablet Take 81 mg by mouth at bedtime.    . celecoxib (CELEBREX) 100 MG capsule Take 1 capsule (100 mg total) by mouth 2 (two) times daily. 60 capsule 3  . citalopram (CELEXA) 40 MG tablet Take 1 tablet (40 mg total) by mouth daily. 90 tablet 3  . clopidogrel (PLAVIX) 75 MG tablet TAKE 1 TABLET BY MOUTH EVERY DAY 90 tablet 3  . lenalidomide (REVLIMID) 10 MG capsule Take 1 capsule (10 mg total) by mouth daily. Take 1 capsule by mouth daily for 7 days on followed by 7 days off 14 capsule 0  . nitroGLYCERIN (NITROSTAT) 0.4 MG SL tablet Place 1 tablet (0.4 mg total) under the tongue every 5 (five) minutes as needed for chest pain. 25 tablet 12  . omeprazole (PRILOSEC) 20 MG capsule Take 1 capsule (20 mg total) by mouth daily. 90 capsule 1  . ondansetron (ZOFRAN) 4 MG tablet Take 1 tablet (4 mg total) by mouth every 8 (eight) hours as needed for nausea or vomiting. 30 tablet 2  . oxyCODONE-acetaminophen (PERCOCET/ROXICET) 5-325 MG per tablet Take 1 tablet by mouth every 8 (  eight) hours as needed for severe pain. 90 tablet 0  . potassium chloride 20 MEQ TBCR Take 20 mEq by mouth daily. 30 tablet 0  . simvastatin (ZOCOR) 20 MG tablet Take 1 tablet (20 mg total) by mouth at bedtime. 90 tablet 2  . traMADol (ULTRAM) 50 MG tablet Take 1 tablet (50 mg total) by mouth every 8 (eight) hours as needed. (Patient taking differently: Take 50 mg by mouth every 8 (eight)  hours as needed (pain). ) 60 tablet 2   No current facility-administered medications for this visit.   Facility-Administered Medications Ordered in Other Visits  Medication Dose Route Frequency Provider Last Rate Last Dose  . acetaminophen (TYLENOL) tablet 650 mg  650 mg Oral Q6H PRN Baird Cancer, PA-C   650 mg at 12/09/14 0855  . sodium chloride 0.9 % injection 10 mL  10 mL Intracatheter PRN Baird Cancer, PA-C   10 mL at 12/09/14 9937    Past Medical History  Diagnosis Date  . Hypertension   . Heart disease   . Kidney stones     history  . Lung mass   . Heart murmur   . 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 Coronary artery disease   . Depression   . Shortness of breath   . Peripheral arterial disease   . Bladder neck contracture   . Personal history of other diseases of circulatory system   . Aortic aneurysm of unspecified site without mention of rupture   . Arthritis   . Intestinovesical fistula   . Esophageal reflux   . Hyperlipidemia   . Anemia   . CHF (congestive heart failure)   . COPD (chronic obstructive pulmonary disease)   . Cerebral atherosclerosis     Carotid Doppler, 02/16/2013 - Bilateral Proximal ICAs,demonstrate mild plaque w/o evidence of significant diameter reduction, dissection, or any other vascular abnormality  . Complication of anesthesia   . PONV (postoperative nausea and vomiting)   . Stroke 2013    Speech.  . Hx of bladder cancer 10/07/2012  . Sleep apnea     05-02-14 cpap , not yet used- suggested settings 5  . Cancer   . Prostate cancer 2000  . Multiple myeloma   . Non Hodgkin's lymphoma   . Myocardial infarction     '96    Past Surgical History  Procedure Laterality Date  . Prostate surgery    . Heart stents x 5  1999  . Portacath placement  07/26/2009    right chest  . Wrist surgery      right  . Left ear skin cancer removed    . Bone marrow transplant  2011    . Pacemaker insertion  07/22/2011    Medtronic  . Coronary angioplasty  06/24/2000    PCI and stenting in mid & proximal RCA  . Insert / replace / remove pacemaker    . Tee without cardioversion  10/13/2012    Procedure: TRANSESOPHAGEAL ECHOCARDIOGRAM (TEE);  Surgeon: Sanda Klein, MD;  Location: [name] ENDOSCOPY;  Service: Cardiovascular;  Laterality: N/A;  pat/kay/echo notified  . Colonoscopy N/A 01/01/2013    Procedure: COLONOSCOPY;  Surgeon: Rogene Houston, MD;  Location: [facility] ENDO SUITE;  Service: Endoscopy;  Laterality: N/A;  825-moved to [place] notified pt  . Rotator    . Rotator cuff repair Right   . Colon surgery      colon resection  . Bladder  surgery    . Shoulder arthroscopy with subacromial decompression Right 07/21/2013    Procedure: RIGHT SHOULDER ARTHROSCOPY WITH SUBACROMIAL DECOMPRESSION AND DEBRIDEMENT & Injection of Left Shoulder;  Surgeon: Alta Corning, MD;  Location: [facility];  Service: Orthopedics;  Laterality: Right;  . US echocardiography  06/19/2011    RV mildly dilated,mild to mod. MR,mild AI,mild PI  . Nm myocar perf wall motion  11/27/2007    inferior scar  . Inguinal hernia repair Right 05/04/2014    Procedure: OPEN RIGHT INGUINAL HERNIA REPAIR with mesh;  Surgeon: Edward Jolly, MD;  Location: [facility] ORS;  Service: General;  Laterality: Right;    Family History  Problem Relation Age of Onset  . Colon cancer Neg Hx   . Colon polyps Neg Hx   . Cancer Father     bladder  . Heart disease Father     before age 13  . Hypertension Mother   . Cancer Brother   . Heart disease Brother     before age 51  . Heart disease Sister     before age 48  . Hyperlipidemia Sister   . Hypertension Sister   . Heart attack Sister     History   Social History  . Marital Status: Married    Spouse Name: [name]  . Number of Children: 3  . Years of Education: 9th   Occupational History  . retired     Social History Main Topics  . Smoking status: Former Smoker -- 1.00  packs/day for 20 years    Types: Cigarettes    Quit date: 11/14/1994  . Smokeless tobacco: Never Used  . Alcohol Use: No     Comment: previously drank but none for at least 15 years.  . Drug Use: No  . Sexual Activity: Not on file   Other Topics Concern  . Not on file   Social History Narrative   Patient lives at home spouse.   Caffeine Use: Occasionally    Review of systems: The patient specifically denies any chest pain at rest or with exertion, dyspnea at rest or with exertion, orthopnea, paroxysmal nocturnal dyspnea, syncope, palpitations, focal neurological deficits, intermittent claudication, lower extremity edema, unexplained weight gain, cough, hemoptysis or wheezing.  The patient also denies abdominal pain, nausea, vomiting, dysphagia, diarrhea, constipation, polyuria, polydipsia, dysuria, hematuria, frequency, urgency, abnormal bleeding or bruising, fever, chills, unexpected weight changes, mood swings, change in skin or hair texture, change in voice quality, auditory or visual problems, allergic reactions or rashes, new musculoskeletal complaints other than usual "aches and pains".   PHYSICAL EXAM BP 123/73 mmHg  Pulse 69  Ht $R'5\' 8"'qF$  (1.727 m)  Wt 84.732 kg (186 lb 12.8 oz)  BMI 28.41 kg/m2 General: Alert, oriented x3, no distress Head: no evidence of trauma, PERRL, EOMI, no exophtalmos or lid lag, no myxedema, no xanthelasma; normal ears, nose and oropharynx Neck: normal jugular venous pulsations and no hepatojugular reflux; brisk carotid pulses without delay and no carotid bruits Chest: clear to auscultation, no signs of consolidation by percussion or palpation, normal fremitus, symmetrical and full respiratory excursions, subclavian pacemaker site appears healthy Cardiovascular: normal position and quality of the apical impulse, regular rhythm, normal first and second heart sounds, no murmurs, rubs or gallops Abdomen: no tenderness or distention, no masses by palpation,  no abnormal pulsatility or arterial bruits, normal bowel sounds, no hepatosplenomegaly Extremities: no clubbing, cyanosis or edema; 2+ radial, ulnar and brachial pulses bilaterally; 2+ right femoral, posterior tibial and  dorsalis pedis pulses; 2+ left femoral, posterior tibial and dorsalis pedis pulses; no subclavian or femoral bruits Neurological: grossly nonfocal   EKG: Atrial paced ventricular sensed left anterior fascicular block no acute ischemic changes   Lipid Panel     Component Value Date/Time   CHOL 113 03/23/2014 1020   TRIG 132 03/23/2014 1020   HDL 33* 03/23/2014 1020   CHOLHDL 3.4 03/23/2014 1020   VLDL 26 03/23/2014 1020   LDLCALC 54 03/23/2014 1020    BMET    Component Value Date/Time   NA 138 12/09/2014 0828   K 3.7 12/09/2014 0828   CL 109 12/09/2014 0828   CO2 25 12/09/2014 0828   GLUCOSE 88 12/09/2014 0828   BUN 8 12/09/2014 0828   CREATININE 0.89 12/09/2014 0828   CREATININE 0.80 11/07/2014 1034   CALCIUM 8.7 12/09/2014 0828   GFRNONAA 83* 12/09/2014 0828   GFRAA >90 12/09/2014 0828     ASSESSMENT AND PLAN  H/O cardiac pacemaker, Medtronic REVO, MRI conditional device, placed 07/2011 for sympyomatic bradycardia Pacemaker check in clinic. Normal device function.    CAD, dating to 1996 with multiple PCIs, Stents to RCA and LCX, Last cath 2007 patent stents,30% prox LAD lesion, EF 45% Free of angina. No ischemia on recent nuclear stress test.  Hyperlipidemia LDL goal < 100 Lipid parameters are all within the desirable range.  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 Good ABIs with three-vessel runoff on the left. Note occlusion of the right posterior tibial with good reconstitution via collaterals  Ischemic stroke with residual expressive aphasia, improved       Orders Placed This Encounter    Procedures  . Lipid panel  . Hepatic function panel   No orders of the defined types were placed in this encounter.    Holli Humbles, MD, [place] [phone] office [phone] pager

## 2014-12-27 NOTE — Patient Instructions (Addendum)
Your physician recommends that you return for lab work in: FASTING labs at [facility].   Remote monitoring is used to monitor your pacemaker from home. This monitoring reduces the number of office visits required to check your device to one time per year. It allows [place] to keep an eye on the functioning of your device to ensure it is working properly. You are scheduled for a device check from home on 03/28/2015. You may send your transmission at any time that day. If you have a wireless device, the transmission will be sent automatically. After your physician reviews your transmission, you will receive a postcard with your next transmission date.  Your physician recommends that you schedule a follow-up appointment in: 12 months with Dr.Croitoru

## 2014-12-28 LAB — LIPID PANEL
CHOL/HDL RATIO: 3 ratio
Cholesterol: 104 mg/dL (ref 0–200)
HDL: 35 mg/dL — AB (ref >39)
LDL CALC: 49 mg/dL (ref 0–99)
Triglycerides: 101 mg/dL (ref <150)
VLDL: 20 mg/dL (ref 0–40)

## 2015-01-02 NOTE — Telephone Encounter (Signed)
Closed encounter °

## 2015-01-04 ENCOUNTER — Other Ambulatory Visit (HOSPITAL_COMMUNITY): Payer: Self-pay | Admitting: Oncology

## 2015-01-04 DIAGNOSIS — C9000 Multiple myeloma not having achieved remission: Secondary | ICD-10-CM

## 2015-01-04 MED ORDER — LENALIDOMIDE 10 MG PO CAPS: 10.0000 mg | ORAL_CAPSULE | Freq: Every day | ORAL | Status: DC

## 2015-01-05 ENCOUNTER — Other Ambulatory Visit (HOSPITAL_COMMUNITY): Payer: Self-pay

## 2015-01-05 ENCOUNTER — Ambulatory Visit (INDEPENDENT_AMBULATORY_CARE_PROVIDER_SITE_OTHER): Payer: Medicare Other | Admitting: Cardiovascular Disease

## 2015-01-05 VITALS — BP 140/74 | HR 76 | Ht 68.0 in | Wt 189.9 lb

## 2015-01-05 DIAGNOSIS — I2510 Atherosclerotic heart disease of native coronary artery without angina pectoris: Secondary | ICD-10-CM | POA: Diagnosis not present

## 2015-01-05 DIAGNOSIS — G4733 Obstructive sleep apnea (adult) (pediatric): Secondary | ICD-10-CM | POA: Diagnosis not present

## 2015-01-05 DIAGNOSIS — G4731 Primary central sleep apnea: Secondary | ICD-10-CM | POA: Diagnosis not present

## 2015-01-05 MED ORDER — ZOLPIDEM TARTRATE ER 6.25 MG PO TBCR: 6.2500 mg | EXTENDED_RELEASE_TABLET | Freq: Every evening | ORAL | Status: DC | PRN

## 2015-01-05 NOTE — Patient Instructions (Signed)
Your physician has recommended you make the following change in your medication: start new prescription given today for Zolpidem.  Your physician recommends that you schedule a follow-up appointment in: April sleep clinic with Dr. Claiborne Billings.

## 2015-01-06 ENCOUNTER — Ambulatory Visit (HOSPITAL_COMMUNITY): Payer: Medicare Other

## 2015-01-06 ENCOUNTER — Encounter (HOSPITAL_BASED_OUTPATIENT_CLINIC_OR_DEPARTMENT_OTHER): Payer: Medicare Other

## 2015-01-06 ENCOUNTER — Encounter (HOSPITAL_COMMUNITY): Payer: Medicare Other | Attending: Oncology | Admitting: Hematology & Oncology

## 2015-01-06 ENCOUNTER — Encounter (HOSPITAL_COMMUNITY): Payer: Self-pay | Admitting: Hematology & Oncology

## 2015-01-06 ENCOUNTER — Telehealth: Payer: Self-pay | Admitting: *Deleted

## 2015-01-06 VITALS — BP 138/75 | HR 74 | Temp 97.9°F | Resp 18 | Wt 187.2 lb

## 2015-01-06 DIAGNOSIS — Z8572 Personal history of non-Hodgkin lymphomas: Secondary | ICD-10-CM

## 2015-01-06 DIAGNOSIS — G8929 Other chronic pain: Secondary | ICD-10-CM

## 2015-01-06 DIAGNOSIS — D72819 Decreased white blood cell count, unspecified: Secondary | ICD-10-CM | POA: Diagnosis not present

## 2015-01-06 DIAGNOSIS — C9001 Multiple myeloma in remission: Secondary | ICD-10-CM | POA: Diagnosis not present

## 2015-01-06 DIAGNOSIS — C9000 Multiple myeloma not having achieved remission: Secondary | ICD-10-CM | POA: Diagnosis not present

## 2015-01-06 DIAGNOSIS — F329 Major depressive disorder, single episode, unspecified: Secondary | ICD-10-CM

## 2015-01-06 DIAGNOSIS — M12812 Other specific arthropathies, not elsewhere classified, left shoulder: Secondary | ICD-10-CM

## 2015-01-06 DIAGNOSIS — M75102 Unspecified rotator cuff tear or rupture of left shoulder, not specified as traumatic: Secondary | ICD-10-CM

## 2015-01-06 DIAGNOSIS — M75101 Unspecified rotator cuff tear or rupture of right shoulder, not specified as traumatic: Secondary | ICD-10-CM

## 2015-01-06 DIAGNOSIS — D696 Thrombocytopenia, unspecified: Secondary | ICD-10-CM | POA: Diagnosis not present

## 2015-01-06 DIAGNOSIS — M12811 Other specific arthropathies, not elsewhere classified, right shoulder: Secondary | ICD-10-CM

## 2015-01-06 DIAGNOSIS — M2550 Pain in unspecified joint: Secondary | ICD-10-CM | POA: Diagnosis not present

## 2015-01-06 LAB — COMPREHENSIVE METABOLIC PANEL
ALBUMIN: 3.8 g/dL (ref 3.5–5.2)
ALT: 17 U/L (ref 0–53)
AST: 23 U/L (ref 0–37)
Alkaline Phosphatase: 100 U/L (ref 39–117)
Anion gap: 5 (ref 5–15)
BUN: 7 mg/dL (ref 6–23)
CHLORIDE: 108 mmol/L (ref 96–112)
CO2: 28 mmol/L (ref 19–32)
CREATININE: 0.7 mg/dL (ref 0.50–1.35)
Calcium: 8.9 mg/dL (ref 8.4–10.5)
GFR calc Af Amer: >90 mL/min (ref >90)
GFR calc non Af Amer: >90 mL/min (ref >90)
Glucose, Bld: 118 mg/dL — ABNORMAL HIGH (ref 70–99)
POTASSIUM: 3.5 mmol/L (ref 3.5–5.1)
Sodium: 141 mmol/L (ref 135–145)
TOTAL PROTEIN: 6.7 g/dL (ref 6.0–8.3)
Total Bilirubin: 0.7 mg/dL (ref 0.3–1.2)

## 2015-01-06 LAB — CBC WITH DIFFERENTIAL/PLATELET
BASOS ABS: 0 10*3/uL (ref 0.0–0.1)
Basophils Relative: 1 % (ref 0–1)
EOS ABS: 0.1 10*3/uL (ref 0.0–0.7)
EOS PCT: 6 % — AB (ref 0–5)
HCT: 36.9 % — ABNORMAL LOW (ref 39.0–52.0)
Hemoglobin: 12 g/dL — ABNORMAL LOW (ref 13.0–17.0)
LYMPHS ABS: 1 10*3/uL (ref 0.7–4.0)
Lymphocytes Relative: 43 % (ref 12–46)
MCH: 28.6 pg (ref 26.0–34.0)
MCHC: 32.5 g/dL (ref 30.0–36.0)
MCV: 87.9 fL (ref 78.0–100.0)
Monocytes Absolute: 0.2 10*3/uL (ref 0.1–1.0)
Monocytes Relative: 7 % (ref 3–12)
NEUTROS PCT: 43 % (ref 43–77)
Neutro Abs: 1 10*3/uL — ABNORMAL LOW (ref 1.7–7.7)
PLATELETS: 98 10*3/uL — AB (ref 150–400)
RBC: 4.2 MIL/uL — ABNORMAL LOW (ref 4.22–5.81)
RDW: 16.9 % — ABNORMAL HIGH (ref 11.5–15.5)
WBC: 2.4 10*3/uL — ABNORMAL LOW (ref 4.0–10.5)

## 2015-01-06 LAB — C-REACTIVE PROTEIN

## 2015-01-06 MED ORDER — OXYCODONE-ACETAMINOPHEN 5-325 MG PO TABS: 1.0000 | ORAL_TABLET | Freq: Three times a day (TID) | ORAL | Status: DC | PRN

## 2015-01-06 NOTE — Telephone Encounter (Signed)
Called [facility] to get PA for patient's Zolpidem. Was informed that they will inform me via fax of their decision. This could take up to 72 hours. Case # [id number].

## 2015-01-06 NOTE — Patient Instructions (Signed)
[place] at [facility] Discharge Instructions  RECOMMENDATIONS MADE BY THE CONSULTANT AND ANY TEST RESULTS WILL BE SENT TO YOUR REFERRING PHYSICIAN.  Exam and discussion by Dr. Whitney Muse Will switch your IVIG back to monthly and will start next Thursday.  Report fevers, night sweats, unexplained weight loss, etc. IVIG next Thursday Labs and office visit in 4 weeks.  Thank you for choosing [place] at [facility] to provide your oncology and hematology care.  To afford each patient quality time with our provider, please arrive at least 15 minutes before your scheduled appointment time.    You need to re-schedule your appointment should you arrive 10 or more minutes late.  We strive to give you quality time with our providers, and arriving late affects you and other patients whose appointments are after yours.  Also, if you no show three or more times for appointments you may be dismissed from the clinic at the providers discretion.     Again, thank you for choosing [facility].  Our hope is that these requests will decrease the amount of time that you wait before being seen by our physicians.       _____________________________________________________________  Should you have questions after your visit to [facility], please contact our office at [phone] between the hours of 8:30 a.m. and 4:30 p.m.  Voicemails left after 4:30 p.m. will not be returned until the following business day.  For prescription refill requests, have your pharmacy contact our office.

## 2015-01-06 NOTE — Progress Notes (Signed)
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 Marcell Parrish. 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 In 2011 he was diagnosed with IgG lambda multiple myeloma and treated with 4 cycles of Velcade and dexamethasone followed by autologous peripheral blood stem cell transplant and currently remains on maintenance Revlimid and also monthly intravenous IgG plus Zometa 4 mg intravenously monthly, last treatment on 08/18/2014 with Zometa.   DIAGNOSIS: IgG lambda Myeloma   CURRENT THERAPY:  IVIG        Revlimid 10 mg 7 on/7 off        Zometa X 5 years, now         discontinued  INTERVAL HISTORY: Jeremy Parrish 74 y.o. male returns for follow-up of his myeloma and hypogammagloulenemia.  His major complaint revolves around his rotator cuffs and chronic pain.  He has oxycodone but takes ibuprofen as well.  He is concerned about mixing medications.   A good friend just stop dialysis and passed away. That is been very difficult for him. He complains of feeling depressed over this for the past week. He complains of no energy which is chronic.   MEDICAL HISTORY: Past Medical History  Diagnosis Date  . Hypertension   . Heart disease   . Kidney stones     history  . Lung mass   . Heart murmur   . 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 Coronary artery disease   . Depression   . Shortness of breath   . Peripheral arterial disease   . Bladder neck contracture   . Personal history of other diseases of circulatory system   . Aortic aneurysm of unspecified site without mention of rupture   . Arthritis   .  Intestinovesical fistula   . Esophageal reflux   . Hyperlipidemia   . Anemia   . CHF (congestive heart failure)   . COPD (chronic obstructive pulmonary disease)   . Cerebral atherosclerosis     Carotid Doppler, 02/16/2013 - Bilateral Proximal ICAs,demonstrate mild plaque w/o evidence of significant diameter reduction, dissection, or any other vascular abnormality  . Complication of anesthesia   . PONV (postoperative nausea and vomiting)   . Stroke 2013    Speech.  . Hx of bladder cancer 10/07/2012  . Sleep apnea     05-02-14 cpap , not yet used- suggested settings 5  . Cancer   . Prostate cancer 2000  . Multiple myeloma   . Non Hodgkin's lymphoma   . 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; H/O cardiac pacemaker, Medtronic REVO, MRI conditional device, placed 07/2011 for sympyomatic bradycardia; Hx of bladder cancer; Hyperlipidemia with target LDL less than 100; Prediabetes; Expressive aphasia; Hemiplegia affecting right dominant side; Aneurysm of iliac artery; Shortness of breath; Rotator cuff tear arthropathy of right shoulder;  Left rotator cuff tear arthropathy; Muscle weakness (generalized); Status post arthroscopy of shoulder; Pain in joint, shoulder region; Fever; Pneumonia; Pancytopenia; Chest pain with moderate risk for cardiac etiology; Inguinal hernia; Obstructive sleep apnea; Central sleep apnea; Hypokalemia; URI (upper respiratory infection); and Weakness on his problem list.     is allergic to diphenhydramine hcl; morphine and related; and tape.  Jeremy Parrish does not currently have medications on  file.  SURGICAL HISTORY: Past Surgical History  Procedure Laterality Date  . Prostate surgery    . Heart stents x 5  1999  . Portacath placement  07/26/2009    right chest  . Wrist surgery      right  . Left ear skin cancer removed    . Bone marrow transplant  2011  . Pacemaker insertion  07/22/2011    Medtronic  . Coronary angioplasty  06/24/2000    PCI and stenting in mid & proximal RCA  . Insert / replace / remove pacemaker    . Tee without cardioversion  10/13/2012    Procedure: TRANSESOPHAGEAL ECHOCARDIOGRAM (TEE);  Surgeon: Jeremy Klein, MD;  Location: [facility] ENDOSCOPY;  Service: Cardiovascular;  Laterality: N/A;  pat/kay/echo notified  . Colonoscopy N/A 01/01/2013    Procedure: COLONOSCOPY;  Surgeon: Jeremy Houston, MD;  Location: [facility] ENDO SUITE;  Service: Endoscopy;  Laterality: N/A;  825-moved to [place] notified pt  . Rotator    . Rotator cuff repair Right   . Colon surgery      colon resection  . Bladder surgery    . Shoulder arthroscopy with subacromial decompression Right 07/21/2013    Procedure: RIGHT SHOULDER ARTHROSCOPY WITH SUBACROMIAL DECOMPRESSION AND DEBRIDEMENT & Injection of Left Shoulder;  Surgeon: Jeremy Corning, MD;  Location: [facility];  Service: Orthopedics;  Laterality: Right;  . US echocardiography  06/19/2011    RV mildly dilated,mild to mod. MR,mild AI,mild PI  . Nm myocar perf wall motion  11/27/2007    inferior scar  . Inguinal hernia repair Right 05/04/2014    Procedure: OPEN RIGHT INGUINAL HERNIA REPAIR with mesh;  Surgeon: Jeremy Jolly, MD;  Location: [facility] ORS;  Service: General;  Laterality: Right;    SOCIAL HISTORY: History   Social History  . Marital Status: Married    Spouse Name: Jeremy Parrish  . Number of Children: 3  . Years of Education: 9th   Occupational History  . retired     Social History Main Topics  . Smoking status: Former Smoker -- 1.00 packs/day for 20 years    Types: Cigarettes    Quit date: 11/14/1994  . Smokeless tobacco:  Never Used  . Alcohol Use: No     Comment: previously drank but none for at least 15 years.  . Drug Use: No  . Sexual Activity: Not on file   Other Topics Concern  . Not on file   Social History Narrative   Patient lives at home spouse.   Caffeine Use: Occasionally    FAMILY HISTORY: Family History  Problem Relation Age of Onset  . Colon cancer Neg Hx   . Colon polyps Neg Hx   . Cancer Father     bladder  . Heart disease Father     before age 77  . Hypertension Mother   . Cancer Brother   . Heart disease Brother     before age 46  . Heart disease Sister     before age 78  . Hyperlipidemia Sister   . Hypertension Sister   .  Heart attack Sister     Review of Systems  Constitutional: Positive for malaise/fatigue. Negative for fever, chills and weight loss.  HENT: Negative for congestion, hearing loss, nosebleeds, sore throat and tinnitus.   Eyes: Negative for blurred vision, double vision, pain and discharge.  Respiratory: Negative for cough, hemoptysis, sputum production, shortness of breath and wheezing.   Cardiovascular: Negative for chest pain, palpitations, claudication, leg swelling and PND.  Gastrointestinal: Negative for heartburn, nausea, vomiting, abdominal pain, diarrhea, constipation, blood in stool and melena.  Genitourinary: Negative for dysuria, urgency, frequency and hematuria.  Musculoskeletal: Positive for joint pain. Negative for myalgias and falls.  Skin: Negative for itching and rash.  Neurological: Positive for weakness. Negative for dizziness, tingling, tremors, sensory change, speech change, focal weakness, seizures, loss of consciousness and headaches.  Endo/Heme/Allergies: Does not bruise/bleed easily.  Psychiatric/Behavioral: Positive for depression. Negative for suicidal ideas, memory loss and substance abuse. The patient is nervous/anxious. The patient does not have insomnia.     PHYSICAL EXAMINATION  ECOG PERFORMANCE STATUS: 1 -  Symptomatic but completely ambulatory  Filed Vitals:   01/06/15 0900  BP: 138/75  Pulse: 74  Temp: 97.9 F (36.6 C)  Resp: 18    Physical Exam  Constitutional: He is oriented to person, place, and time and well-developed, well-nourished, and in no distress.  HENT:  Head: Normocephalic and atraumatic.  Nose: Nose normal.  Mouth/Throat: Oropharynx is clear and moist. No oropharyngeal exudate.  Eyes: Conjunctivae and EOM are normal. Pupils are equal, round, and reactive to light. Right eye exhibits no discharge. Left eye exhibits no discharge. No scleral icterus.  Neck: Normal range of motion. Neck supple. No tracheal deviation present. No thyromegaly present.  Cardiovascular: Normal rate, regular rhythm and normal heart sounds.  Exam reveals no gallop and no friction rub.   No murmur heard. Pulmonary/Chest: Effort normal and breath sounds normal. He has no wheezes. He has no rales.  Abdominal: Soft. Bowel sounds are normal. He exhibits no distension and no mass. There is no tenderness. There is no rebound and no guarding.  Musculoskeletal: Normal range of motion. He exhibits no edema.  Lymphadenopathy:    He has no cervical adenopathy.  Neurological: He is alert and oriented to person, place, and time. He has normal reflexes. No cranial nerve deficit. Gait normal. Coordination normal.  Skin: Skin is warm and dry. No rash noted.  Psychiatric: Mood, memory, affect and judgment normal.  Nursing note and vitals reviewed.   LABORATORY DATA:  CBC    Component Value Date/Time   WBC 2.4* 01/06/2015 0925   RBC 4.20* 01/06/2015 0925   HGB 12.0* 01/06/2015 0925   HCT 36.9* 01/06/2015 0925   PLT 98* 01/06/2015 0925   MCV 87.9 01/06/2015 0925   MCH 28.6 01/06/2015 0925   MCHC 32.5 01/06/2015 0925   RDW 16.9* 01/06/2015 0925   LYMPHSABS 1.0 01/06/2015 0925   MONOABS 0.2 01/06/2015 0925   EOSABS 0.1 01/06/2015 0925   BASOSABS 0.0 01/06/2015 0925   CMP     Component Value Date/Time    NA 138 12/09/2014 0828   K 3.7 12/09/2014 0828   CL 109 12/09/2014 0828   CO2 25 12/09/2014 0828   GLUCOSE 88 12/09/2014 0828   BUN 8 12/09/2014 0828   CREATININE 0.89 12/09/2014 0828   CREATININE 0.80 11/07/2014 1034   CALCIUM 8.7 12/09/2014 0828   PROT PENDING 01/06/2015 0924   ALBUMIN 3.6 12/09/2014 0828   AST 20 12/09/2014 0828   ALT  14 12/09/2014 0828   ALKPHOS 78 12/09/2014 0828   BILITOT 0.5 12/09/2014 0828   GFRNONAA 83* 12/09/2014 0828   GFRAA >90 12/09/2014 0828      ASSESSMENT and THERAPY PLAN:   IgG lambda multiple myeloma Hypogammaglobulinemia  He will continue his Revlimid. He has mild worsening of leukopenia and thrombocytopenia which may be secondary to his medication. His myeloma labs are otherwise stable. We will continue to monitor this and make additional recommendations moving forward pending his blood counts. Certainly if I see any change in his myeloma studies we may recommend bone marrow biopsy.  He is to continue IVIG for his hypogammaglobulinemia. He has no evidence of recurrent infection and currently is having no difficulty. We will continue with close observation and see him back again in the next month.  All questions were answered. The patient knows to call the clinic with any problems, questions or concerns. We can certainly see the patient much sooner if necessary.  Molli Hazard 01/06/2015

## 2015-01-06 NOTE — Progress Notes (Signed)
Labs for crp,b21m,mm,kllc,cmp,cbcd

## 2015-01-07 ENCOUNTER — Encounter: Payer: Self-pay | Admitting: Cardiovascular Disease

## 2015-01-07 LAB — BETA 2 MICROGLOBULIN, SERUM: Beta-2 Microglobulin: 1.9 mg/L (ref 0.6–2.4)

## 2015-01-07 NOTE — Progress Notes (Signed)
Patient ID: Jeremy Parrish, male   DOB: [birth year], 74 y.o.   MRN: [id number]     HPI: Jeremy Parrish is a 74 y.o. male who is followed by Dr. Gwenlyn Found for his cardiology care and Dr.Croitoru for his pacemaker.  I had seen him in September 2015 in a sleep clinic following initiation of CPAP therapy.  He presents for follow-up evaluation following a recent BiPAP titration.  Jeremy Parrish has a history of  CAD with prior stenting to his RCA, circumflex vessel, and with his most recent Myoview study in January 2015 revealing lateral scar with an ejection fraction of 35-40%.  He is status post a permanent pacemaker and has a history of hypertension, as well as hyperlipidemia.  He was referred for a polysomnogram, which was done in [place] at [facility].  This was done in a split-night protocol and demonstrated moderate sleep apnea with an AHI of 21.8.  His RDI was 25.8.  On CPAP titration . he was very sensitive to pressure.  He was only titrated between 4-7 cm due to development of central events. He has an AirSense 10 AutoSet unit and he was set at a minimum pressure of 6 and maximum pressure of 20.  When I saw him in September 2015 a download from 06/14/2014 through 07/31/2014 revealed a maximum average pressure of 18.8 cm in his 95th percentile pressure was 17.5.. He's been using a [employer] Simplus fullface mask, medium. He met  Medicare compliance standards with usage days at 93%.  There was 70% of the time when usage was greater than 4 hours and he was averaging 5 hours and 30 minutes of use.  His AHI was increased at 18.6.  His apnea index was 16.2, and central apnea index 9.6, with obstructive index of 5.8  I saw for follow-up evaluation he was still complaining ofypersomnolence.  He is unaware of breakthrough snoring.  An Epworth scale was calculated, and this is compatible with continued excessive daytime sleepiness as noted below   Epworth Sleepiness Scale: In September was as  below Situation   Chance of Dozing/Sleeping (0 = never , 1 = slight chance , 2 = moderate chance , 3 = high chance )   sitting and reading 3   watching TV 3   sitting inactive in a public place 3   being a passenger in a motor vehicle for an hour or more 2   lying down in the afternoon 3   sitting and talking to someone 0   sitting quietly after lunch (no alcohol) 2   while stopped for a few minutes in traffic as the driver 0   Total Score  16   With CPAP therapy, he was having central apneas with an index of 9.6 on therapy.  As result, I recommended he undergo a BiPAP titration.  His BiPAP titration was done at [facility] on 08/29/2014.  He ultimately was titrated up to 15/10 water pressure.  At pressures above 11/7 he developed increased frequency of central apneic events.  The AHI at 11/7 his AHI was 2.3 with an oxygen nadir of 91% and with only 1 central apneic event.  Central apneas were significantly higher at a 12/8 pressure.  As result, he was recommended to initiate therapy with BiPAP with Bi- Flex/EPR 3 at 11/7 water pressure.   He received his BiPAP unit since his evaluation in the office in December.  A new download from generally 19 2016 through  12/28/2014 shows 100% compliance with days used.  Usage greater than 4 hours was at 73% of the days.'s averaging 5 hours and 45 minutes.  He has had difficulty with sleep maintenance.  Despite initially falling asleep.  On BiPAP at it.  IPAP pressure of 11, and EPAP pressure of 7 index is 20.6 and I popped the index 2.0, giving him an AHI of 22.6.  Although the ResMed download does not differentiate between central versus obstructive events, my suspicion is that most of these apneic events may very well be central events.  He presents for evaluation.  A new Epworth Sleepiness Scale score was recalculated today in the office this endorsed at 14 which is compatible with excessive daytime sleepiness.  He had a high chance of dozing while  sitting and reading, sitting, inactive in a public place, and had moderate chance of dozing while watching TV, as a passenger in a car for an hour without a break, lying down to rest in the afternoon when circumstances permit, and while sitting quietly after lunch without alcohol.  Past Medical History  Diagnosis Date  . Hypertension   . Heart disease   . Kidney stones     history  . Lung mass   . Heart murmur   . 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 Coronary artery disease   . Depression   . Shortness of breath   . Peripheral arterial disease   . Bladder neck contracture   . Personal history of other diseases of circulatory system   . Aortic aneurysm of unspecified site without mention of rupture   . Arthritis   . Intestinovesical fistula   . Esophageal reflux   . Hyperlipidemia   . Anemia   . CHF (congestive heart failure)   . COPD (chronic obstructive pulmonary disease)   . Cerebral atherosclerosis     Carotid Doppler, 02/16/2013 - Bilateral Proximal ICAs,demonstrate mild plaque w/o evidence of significant diameter reduction, dissection, or any other vascular abnormality  . Complication of anesthesia   . PONV (postoperative nausea and vomiting)   . Stroke 2013    Speech.  . Hx of bladder cancer 10/07/2012  . Sleep apnea     05-02-14 cpap , not yet used- suggested settings 5  . Cancer   . Prostate cancer 2000  . Multiple myeloma   . Non Hodgkin's lymphoma   . Myocardial infarction     '96    Past Surgical History  Procedure Laterality Date  . Prostate surgery    . Heart stents x 5  1999  . Portacath placement  07/26/2009    right chest  . Wrist surgery      right  . Left ear skin cancer removed    . Bone marrow transplant  2011  . Pacemaker insertion  07/22/2011    Medtronic  . Coronary angioplasty  06/24/2000    PCI and stenting in mid & proximal RCA  . Insert / replace / remove pacemaker    . Tee  without cardioversion  10/13/2012    Procedure: TRANSESOPHAGEAL ECHOCARDIOGRAM (TEE);  Surgeon: Sanda Klein, MD;  Location: [facility] ENDOSCOPY;  Service: Cardiovascular;  Laterality: N/A;  pat/kay/echo notified  . Colonoscopy N/A 01/01/2013    Procedure: COLONOSCOPY;  Surgeon: Rogene Houston, MD;  Location: [facility] ENDO SUITE;  Service: Endoscopy;  Laterality: N/A;  825-moved to [place] notified pt  . Rotator    . Rotator cuff  repair Right   . Colon surgery      colon resection  . Bladder surgery    . Shoulder arthroscopy with subacromial decompression Right 07/21/2013    Procedure: RIGHT SHOULDER ARTHROSCOPY WITH SUBACROMIAL DECOMPRESSION AND DEBRIDEMENT & Injection of Left Shoulder;  Surgeon: Alta Corning, MD;  Location: [place];  Service: Orthopedics;  Laterality: Right;  . US echocardiography  06/19/2011    RV mildly dilated,mild to mod. MR,mild AI,mild PI  . Nm myocar perf wall motion  11/27/2007    inferior scar  . Inguinal hernia repair Right 05/04/2014    Procedure: OPEN RIGHT INGUINAL HERNIA REPAIR with mesh;  Surgeon: Edward Jolly, MD;  Location: [facility] ORS;  Service: General;  Laterality: Right;    Allergies  Allergen Reactions  . Diphenhydramine Hcl     REACTION: "hyper"  . Morphine And Related Other (See Comments)    hallucinations  . Tape Rash    Paper tape is ok    Current Outpatient Prescriptions  Medication Sig Dispense Refill  . albuterol (PROVENTIL HFA;VENTOLIN HFA) 108 (90 BASE) MCG/ACT inhaler Inhale 2 puffs into the lungs every 6 (six) hours as needed for wheezing or shortness of breath. 1 Inhaler 2  . ALPRAZolam (XANAX) 0.5 MG tablet Take 1 tablet (0.5 mg total) by mouth at bedtime as needed for anxiety. 90 tablet 1  . amLODipine (NORVASC) 10 MG tablet Take 1 tablet (10 mg total) by mouth every morning. 90 tablet 1  . aspirin EC 81 MG tablet Take 81 mg by mouth at bedtime.    . celecoxib (CELEBREX) 100 MG capsule Take 1 capsule (100 mg total) by mouth 2 (two) times  daily. (Patient not taking: Reported on 01/06/2015) 60 capsule 3  . citalopram (CELEXA) 40 MG tablet Take 1 tablet (40 mg total) by mouth daily. 90 tablet 3  . clopidogrel (PLAVIX) 75 MG tablet TAKE 1 TABLET BY MOUTH EVERY DAY 90 tablet 3  . lenalidomide (REVLIMID) 10 MG capsule Take 1 capsule (10 mg total) by mouth daily. Take 1 capsule by mouth daily for 7 days on followed by 7 days off 14 capsule 0  . nitroGLYCERIN (NITROSTAT) 0.4 MG SL tablet Place 1 tablet (0.4 mg total) under the tongue every 5 (five) minutes as needed for chest pain. 25 tablet 12  . omeprazole (PRILOSEC) 20 MG capsule Take 1 capsule (20 mg total) by mouth daily. 90 capsule 1  . ondansetron (ZOFRAN) 4 MG tablet Take 1 tablet (4 mg total) by mouth every 8 (eight) hours as needed for nausea or vomiting. 30 tablet 2  . potassium chloride 20 MEQ TBCR Take 20 mEq by mouth daily. 30 tablet 0  . simvastatin (ZOCOR) 20 MG tablet Take 1 tablet (20 mg total) by mouth at bedtime. 90 tablet 2  . traMADol (ULTRAM) 50 MG tablet Take 1 tablet (50 mg total) by mouth every 8 (eight) hours as needed. (Patient taking differently: Take 50 mg by mouth every 8 (eight) hours as needed (pain). ) 60 tablet 2  . oxyCODONE-acetaminophen (PERCOCET/ROXICET) 5-325 MG per tablet Take 1 tablet by mouth every 8 (eight) hours as needed for severe pain. 90 tablet 0  . zolpidem (AMBIEN CR) 6.25 MG CR tablet Take 1 tablet (6.25 mg total) by mouth at bedtime as needed for sleep. (Patient not taking: Reported on 01/06/2015) 30 tablet 0   No current facility-administered medications for this visit.   Facility-Administered Medications Ordered in Other Visits  Medication Dose Route Frequency  Provider Last Rate Last Dose  . acetaminophen (TYLENOL) tablet 650 mg  650 mg Oral Q6H PRN Baird Cancer, PA-C   650 mg at 12/09/14 0855  . sodium chloride 0.9 % injection 10 mL  10 mL Intracatheter PRN Baird Cancer, PA-C   10 mL at 12/09/14 7517    History   Social  History  . Marital Status: Married    Spouse Name: [name]  . Number of Children: 3  . Years of Education: 9th   Occupational History  . retired     Social History Main Topics  . Smoking status: Former Smoker -- 1.00 packs/day for 20 years    Types: Cigarettes    Quit date: 11/14/1994  . Smokeless tobacco: Never Used  . Alcohol Use: No     Comment: previously drank but none for at least 15 years.  . Drug Use: No  . Sexual Activity: Not on file   Other Topics Concern  . Not on file   Social History Narrative   Patient lives at home spouse.   Caffeine Use: Occasionally     ROS General: Negative; No fevers, chills, or night sweats HEENT: Negative; No changes in vision or hearing, sinus congestion, difficulty swallowing Pulmonary: Negative; No cough, wheezing, shortness of breath, hemoptysis Cardiovascular: Negative; No chest pain, presyncope, syncope, palpatations GI: Positive for GERD; No nausea, vomiting, diarrhea, or abdominal pain GU: Negative; No dysuria, hematuria, or difficulty voiding Musculoskeletal: Negative; no myalgias, joint pain, or weakness Hematologic: Negative; no easy bruising, bleeding Endocrine: Negative; no heat/cold intolerance Neuro: Negative; no changes in balance, headaches Skin: Negative; No rashes or skin lesions Psychiatric: Negative; No behavioral problems, depression Sleep: Positive for obstructive sleep apnea with daytime sleepiness, hypersomnolence,  nobruxism, restless legs, hypnogognic hallucinations, no cataplexy   Physical Exam BP 140/74 mmHg  Pulse 76  Ht _0  (1.727 m)  Wt 189 lb 14.4 oz (86.138 kg)  BMI 28.88 kg/m2  General: Alert, oriented, no distress.  Skin: normal turgor, no rashes HEENT: Normocephalic, atraumatic. Pupils round and reactive; sclera anicteric; extraocular muscles intact; Fundi with mild arteriolar narrowing without hemorrhages or exudates Nose without nasal septal hypertrophy Mouth/Parynx benign; Mallinpatti  scale 3  Neck: No JVD, no carotid bruits with normal carotid upstroke Lungs: clear to ausculatation and percussion; no wheezing or rales  Chest wall: No tenderness to palpation Heart: RRR, s1 s2 normal 2/6 systolic murmur loudest at the apex;  No S3 gallop.  No diastolic murmur.  No rubs thrills or heaves Abdomen: soft, nontender; no hepatosplenomehaly, BS+; abdominal aorta nontender and not dilated by palpation. Back: No CVA tenderness Pulses 2+ Extremities: no clubbinbg cyanosis or edema, Homan's sign negative  Neurologic: grossly nonfocal; cranial nerves intact. Psychological: Normal affect and mood.   LABS:  BMET  BMP Latest Ref Rng 01/06/2015 12/09/2014 11/07/2014  Glucose 70 - 99 mg/dL 118(H) 88 146(H)  BUN 6 - 23 mg/dL _1 Creatinine 0.50 - 1.35 mg/dL 0.70 0.89 0.80  Sodium 135 - 145 mmol/L 141 138 141  Potassium 3.5 - 5.1 mmol/L 3.5 3.7 3.8  Chloride 96 - 112 mmol/L 108 109 104  CO2 19 - 32 mmol/L _2 Calcium 8.4 - 10.5 mg/dL 8.9 8.7 9.0     Hepatic Function Panel   Hepatic Function Latest Ref Rng 01/06/2015 01/06/2015 12/09/2014  Total Protein 6.0 - 8.3 g/dL 6.7 PENDING 6.7  Albumin 3.5 - 5.2 g/dL 3.8 - 3.6  AST 0 - 37 U/L  23 - 20  ALT 0 - 53 U/L 17 - 14  Alk Phosphatase 39 - 117 U/L 100 - 78  Total Bilirubin 0.3 - 1.2 mg/dL 0.7 - 0.5  Bilirubin, Direct 0.0 - 0.3 mg/dL - - -     CBC  CBC Latest Ref Rng 01/06/2015 12/09/2014 11/14/2014  WBC 4.0 - 10.5 K/uL 2.4(L) 2.5(L) 5.3  Hemoglobin 13.0 - 17.0 g/dL 12.0(L) 11.2(L) 11.6(L)  Hematocrit 39.0 - 52.0 % 36.9(L) 35.6(L) 34.9(L)  Platelets 150 - 400 K/uL 98(L) 90(L) 141(L)     BNP    Component Value Date/Time   PROBNP 119.0* 04/19/2010 1158    Lipid Panel     Component Value Date/Time   CHOL 104 12/27/2014 0844   TRIG 101 12/27/2014 0844   HDL 35* 12/27/2014 0844   CHOLHDL 3.0 12/27/2014 0844   VLDL 20 12/27/2014 0844   LDLCALC 49 12/27/2014 0844     RADIOLOGY: No results  found.    ASSESSMENT AND PLAN: Mr. Alvy Alsop is a 74 year old male who has significant cardiac disease including a long-standing CAD dating back to 1996, hypertension, hyperlipidemia, and  is status post permanent pacemaker insertion.  His sleep study confirms at least moderate sleep apnea.  He had not tolerated CPAP therapy secondary to his complex sleep apnea including central events.  After receiving his new BiPAP unit, he has been using therapy 100% of the nights and is continuing to meet Medicare compliance standards.  His most recent download, however, still shows a high AHI.  I reviewed this with him in detail.  He has been set at an IPAP of 11 cm an EPAP of 7 cm.  AHI is 22.6.  He also has been having difficulty with sleep maintenance.  I am giving him a prescription for zolpidem extended release 6.25 mg, which may help with sleep maintenance.  For the next 30 days, I will give him a trial of changing his BiPAP unit which is currently set at a fixed pressure to a BiPAP auto unit with a pressure support of 4 to see if this can improve his events.  However, if he develops significant additional central events this may not be helpful.  A repeat download will be obtained in 30 days.  He may ultimately require adaptive servo ventilation if he is demonstrated to have a significantly elevated AHI despite auto therapy.   Time spent: 25 minutes   Troy Sine, MD, [facility]  01/07/2015 11:58 AM

## 2015-01-08 LAB — KAPPA/LAMBDA LIGHT CHAINS
KAPPA FREE LGHT CHN: 46.03 mg/L — AB (ref 3.30–19.40)
KAPPA, LAMDA LIGHT CHAIN RATIO: 1.55 (ref 0.26–1.65)
Lambda free light chains: 29.74 mg/L — ABNORMAL HIGH (ref 5.71–26.30)

## 2015-01-09 LAB — MDC_IDC_ENUM_SESS_TYPE_INCLINIC
Battery Voltage: 3 V
Brady Statistic AP VP Percent: 1.71 %
Brady Statistic AS VP Percent: 0.07 %
Brady Statistic AS VS Percent: 8.39 %
Brady Statistic RA Percent Paced: 91.55 %
Brady Statistic RV Percent Paced: 1.78 %
Date Time Interrogation Session: 20160216153124
Lead Channel Impedance Value: 400 Ohm
Lead Channel Pacing Threshold Amplitude: 0.5 V
Lead Channel Pacing Threshold Amplitude: 1 V
Lead Channel Sensing Intrinsic Amplitude: 2.1733
Lead Channel Setting Pacing Amplitude: 2.5 V
Lead Channel Setting Pacing Pulse Width: 0.6 ms
Lead Channel Setting Sensing Sensitivity: 0.9 mV
MDC IDC MSMT LEADCHNL RA PACING THRESHOLD PULSEWIDTH: 0.4 ms
MDC IDC MSMT LEADCHNL RV IMPEDANCE VALUE: 400 Ohm
MDC IDC MSMT LEADCHNL RV PACING THRESHOLD PULSEWIDTH: 0.6 ms
MDC IDC MSMT LEADCHNL RV SENSING INTR AMPL: 5.7229
MDC IDC SET LEADCHNL RA PACING AMPLITUDE: 2 V
MDC IDC SET ZONE DETECTION INTERVAL: 400 ms
MDC IDC STAT BRADY AP VS PERCENT: 89.84 %
Zone Setting Detection Interval: 150 ms

## 2015-01-10 LAB — MULTIPLE MYELOMA PANEL, SERUM
ALPHA-1-GLOBULIN: 5.1 % — AB (ref 2.9–4.9)
Albumin ELP: 56.9 % (ref 55.8–66.1)
Alpha-2-Globulin: 10.4 % (ref 7.1–11.8)
Beta 2: 5.5 % (ref 3.2–6.5)
Beta Globulin: 7.2 % (ref 4.7–7.2)
Gamma Globulin: 14.9 % (ref 11.1–18.8)
IGA: 327 mg/dL (ref 68–379)
IgG (Immunoglobin G), Serum: 950 mg/dL (ref 650–1600)
IgM, Serum: 22 mg/dL — ABNORMAL LOW (ref 41–251)
M-Spike, %: NOT DETECTED g/dL
TOTAL PROTEIN: 6.6 g/dL (ref 6.0–8.3)

## 2015-01-12 ENCOUNTER — Ambulatory Visit (HOSPITAL_COMMUNITY): Payer: Medicare Other

## 2015-01-12 ENCOUNTER — Encounter (HOSPITAL_COMMUNITY): Payer: Self-pay

## 2015-01-12 ENCOUNTER — Encounter (HOSPITAL_COMMUNITY): Payer: Medicare Other | Attending: Oncology

## 2015-01-12 DIAGNOSIS — C9000 Multiple myeloma not having achieved remission: Secondary | ICD-10-CM | POA: Insufficient documentation

## 2015-01-12 DIAGNOSIS — C9001 Multiple myeloma in remission: Secondary | ICD-10-CM | POA: Insufficient documentation

## 2015-01-12 DIAGNOSIS — D801 Nonfamilial hypogammaglobulinemia: Secondary | ICD-10-CM | POA: Diagnosis not present

## 2015-01-12 MED ORDER — ACETAMINOPHEN 325 MG PO TABS
650.0000 mg | ORAL_TABLET | Freq: Four times a day (QID) | ORAL | Status: DC | PRN
  Administered 2015-01-12: 650 mg via ORAL
  Filled 2015-01-12: qty 2

## 2015-01-12 MED ORDER — SODIUM CHLORIDE 0.9 % IV SOLN
Freq: Once | INTRAVENOUS | Status: AC
  Administered 2015-01-12: 09:00:00 via INTRAVENOUS

## 2015-01-12 MED ORDER — HEPARIN SOD (PORK) LOCK FLUSH 100 UNIT/ML IV SOLN
500.0000 [IU] | Freq: Once | INTRAVENOUS | Status: AC | PRN
  Administered 2015-01-12: 500 [IU]
  Filled 2015-01-12: qty 5

## 2015-01-12 MED ORDER — SODIUM CHLORIDE 0.9 % IJ SOLN: 10.0000 mL | INTRAMUSCULAR | Status: DC | PRN

## 2015-01-12 MED ORDER — IMMUNE GLOBULIN (HUMAN) 10 GM/100ML IV SOLN
0.4000 g/kg | Freq: Once | INTRAVENOUS | Status: AC
  Administered 2015-01-12: 35 g via INTRAVENOUS
  Filled 2015-01-12: qty 50

## 2015-01-12 NOTE — Patient Instructions (Signed)
[name] at [facility] Discharge Instructions  RECOMMENDATIONS MADE BY THE CONSULTANT AND ANY TEST RESULTS WILL BE SENT TO YOUR REFERRING PHYSICIAN.  Today you received IVIG.  Return as scheduled for for infusions, lab work, and office visits.  Thank you for choosing [place] at [facility] to provide your oncology and hematology care.  To afford each patient quality time with our provider, please arrive at least 15 minutes before your scheduled appointment time.    You need to re-schedule your appointment should you arrive 10 or more minutes late.  We strive to give you quality time with our providers, and arriving late affects you and other patients whose appointments are after yours.  Also, if you no show three or more times for appointments you may be dismissed from the clinic at the providers discretion.     Again, thank you for choosing [facility].  Our hope is that these requests will decrease the amount of time that you wait before being seen by our physicians.       _____________________________________________________________  Should you have questions after your visit to [facility], please contact our office at [phone] between the hours of 8:30 a.m. and 4:30 p.m.  Voicemails left after 4:30 p.m. will not be returned until the following business day.  For prescription refill requests, have your pharmacy contact our office.

## 2015-01-16 ENCOUNTER — Encounter: Payer: Self-pay | Admitting: Cardiovascular Disease

## 2015-01-16 ENCOUNTER — Telehealth (HOSPITAL_COMMUNITY): Payer: Self-pay | Admitting: *Deleted

## 2015-01-16 NOTE — Telephone Encounter (Signed)
Phone call to Jeremy Parrish and explained to him that labs are stable at present and that we are watching his multiple myeloma labs carefully for further changes and that this may someday initiate the need for another bone marrow biopsy, but not at this time. Explained that we are also monitoring his blood counts for low WBC, platelets, etc.  Changes further in these values could mean needing to hold revlimid to see if the drug is causing low blood counts. He verbalized understanding that there are NO changes we need to make at this time and that we just want him to be aware of these possibilities if thing change further in the future.

## 2015-01-27 ENCOUNTER — Other Ambulatory Visit (HOSPITAL_COMMUNITY): Payer: Self-pay | Admitting: Oncology

## 2015-01-27 ENCOUNTER — Telehealth (HOSPITAL_COMMUNITY): Payer: Self-pay | Admitting: *Deleted

## 2015-01-27 DIAGNOSIS — F411 Generalized anxiety disorder: Secondary | ICD-10-CM

## 2015-01-27 DIAGNOSIS — J069 Acute upper respiratory infection, unspecified: Secondary | ICD-10-CM

## 2015-01-27 MED ORDER — ALPRAZOLAM 0.5 MG PO TABS: 0.5000 mg | ORAL_TABLET | Freq: Every evening | ORAL | Status: DC | PRN

## 2015-01-27 MED ORDER — AZITHROMYCIN 250 MG PO TABS: ORAL_TABLET | ORAL | Status: DC

## 2015-01-30 ENCOUNTER — Ambulatory Visit (INDEPENDENT_AMBULATORY_CARE_PROVIDER_SITE_OTHER): Payer: Medicare Other | Admitting: Family Medicine

## 2015-01-30 ENCOUNTER — Encounter: Payer: Self-pay | Admitting: Family Medicine

## 2015-01-30 VITALS — BP 108/72 | Temp 98.5°F | Ht 68.0 in | Wt 180.0 lb

## 2015-01-30 DIAGNOSIS — I2510 Atherosclerotic heart disease of native coronary artery without angina pectoris: Secondary | ICD-10-CM | POA: Diagnosis not present

## 2015-01-30 DIAGNOSIS — J329 Chronic sinusitis, unspecified: Secondary | ICD-10-CM

## 2015-01-30 MED ORDER — FEXOFENADINE HCL 180 MG PO TABS: 180.0000 mg | ORAL_TABLET | Freq: Every day | ORAL | Status: DC

## 2015-01-30 MED ORDER — FLUTICASONE PROPIONATE 50 MCG/ACT NA SUSP: 2.0000 | Freq: Every day | NASAL | Status: DC

## 2015-01-30 NOTE — Progress Notes (Signed)
   Subjective:    Patient ID: Jeremy Parrish, male    DOB: 21-Jan-1941, 74 y.o.   MRN: [id number]  Cough This is a new problem. Episode onset: 4 days. The cough is productive of sputum. Associated symptoms include nasal congestion and a sore throat. Nothing aggravates the symptoms. He has tried OTC cough suppressant (z pak) for the symptoms. The treatment provided mild relief.   Sig cough and drainage and runny nose  Plenty of sneezing and not feeloing the best  Started on Zithromax through the cancer center  Notes very significant fatigue over time   Review of Systems  HENT: Positive for sore throat.   Respiratory: Positive for cough.    no vomiting no diarrhea no rash     Objective:   Physical Exam  Alert hydration good. HET moderate his congestion no frontal tenderness pharynx normal neck supple lungs mild wheezing is no tachypnea no inspiratory crackles. Heart regular in rhythm.      Assessment & Plan:  Impression viral syndrome versus allergy with potential element of sinusitis/bronchitis plan finish Zithromax. Add Allegra and Flonase rationale discussed. Use albuterol faithfully WSL

## 2015-02-02 ENCOUNTER — Other Ambulatory Visit (HOSPITAL_COMMUNITY): Payer: Self-pay | Admitting: Oncology

## 2015-02-02 ENCOUNTER — Telehealth (HOSPITAL_COMMUNITY): Payer: Self-pay | Admitting: *Deleted

## 2015-02-02 ENCOUNTER — Ambulatory Visit (HOSPITAL_COMMUNITY)
Admission: RE | Admit: 2015-02-02 | Discharge: 2015-02-02 | Disposition: A | Discharge status: Home / Self Care | Payer: Medicare Other | Source: Ambulatory Visit | Attending: Oncology | Admitting: Oncology

## 2015-02-02 DIAGNOSIS — R062 Wheezing: Secondary | ICD-10-CM | POA: Insufficient documentation

## 2015-02-02 DIAGNOSIS — J069 Acute upper respiratory infection, unspecified: Secondary | ICD-10-CM

## 2015-02-02 DIAGNOSIS — R0989 Other specified symptoms and signs involving the circulatory and respiratory systems: Secondary | ICD-10-CM | POA: Diagnosis not present

## 2015-02-02 DIAGNOSIS — C9000 Multiple myeloma not having achieved remission: Secondary | ICD-10-CM

## 2015-02-02 MED ORDER — AMOXICILLIN-POT CLAVULANATE 875-125 MG PO TABS: 1.0000 | ORAL_TABLET | Freq: Two times a day (BID) | ORAL | Status: AC

## 2015-02-02 MED ORDER — LENALIDOMIDE 10 MG PO CAPS: 10.0000 mg | ORAL_CAPSULE | Freq: Every day | ORAL | Status: DC

## 2015-02-02 NOTE — Telephone Encounter (Signed)
Patient's son called and reports congestion and fever most of 3 days after taking z-pac. Discussed with T. Kefalas PA and chest xray ordered and augmentin called in.  Talked with son at 65n and they have been in for x-ray and received augmentin and started that.

## 2015-02-06 ENCOUNTER — Encounter (HOSPITAL_BASED_OUTPATIENT_CLINIC_OR_DEPARTMENT_OTHER): Payer: Medicare Other

## 2015-02-06 DIAGNOSIS — C9000 Multiple myeloma not having achieved remission: Secondary | ICD-10-CM | POA: Diagnosis not present

## 2015-02-06 DIAGNOSIS — Z8572 Personal history of non-Hodgkin lymphomas: Secondary | ICD-10-CM

## 2015-02-06 DIAGNOSIS — C9001 Multiple myeloma in remission: Secondary | ICD-10-CM | POA: Diagnosis not present

## 2015-02-06 LAB — CBC WITH DIFFERENTIAL/PLATELET
BASOS ABS: 0 10*3/uL (ref 0.0–0.1)
Basophils Relative: 1 % (ref 0–1)
EOS PCT: 3 % (ref 0–5)
Eosinophils Absolute: 0.2 10*3/uL (ref 0.0–0.7)
HCT: 36.5 % — ABNORMAL LOW (ref 39.0–52.0)
Hemoglobin: 12 g/dL — ABNORMAL LOW (ref 13.0–17.0)
LYMPHS ABS: 1.4 10*3/uL (ref 0.7–4.0)
Lymphocytes Relative: 27 % (ref 12–46)
MCH: 28.6 pg (ref 26.0–34.0)
MCHC: 32.9 g/dL (ref 30.0–36.0)
MCV: 86.9 fL (ref 78.0–100.0)
Monocytes Absolute: 0.4 10*3/uL (ref 0.1–1.0)
Monocytes Relative: 9 % (ref 3–12)
Neutro Abs: 3.1 10*3/uL (ref 1.7–7.7)
Neutrophils Relative %: 60 % (ref 43–77)
PLATELETS: 136 10*3/uL — AB (ref 150–400)
RBC: 4.2 MIL/uL — ABNORMAL LOW (ref 4.22–5.81)
RDW: 15.7 % — AB (ref 11.5–15.5)
WBC: 5.1 10*3/uL (ref 4.0–10.5)

## 2015-02-06 LAB — COMPREHENSIVE METABOLIC PANEL
ALBUMIN: 3.5 g/dL (ref 3.5–5.2)
ALT: 18 U/L (ref 0–53)
AST: 26 U/L (ref 0–37)
Alkaline Phosphatase: 76 U/L (ref 39–117)
Anion gap: 8 (ref 5–15)
BILIRUBIN TOTAL: 0.6 mg/dL (ref 0.3–1.2)
BUN: 12 mg/dL (ref 6–23)
CHLORIDE: 104 mmol/L (ref 96–112)
CO2: 24 mmol/L (ref 19–32)
CREATININE: 1.02 mg/dL (ref 0.50–1.35)
Calcium: 8.7 mg/dL (ref 8.4–10.5)
GFR calc Af Amer: 83 mL/min — ABNORMAL LOW (ref >90)
GFR calc non Af Amer: 71 mL/min — ABNORMAL LOW (ref >90)
Glucose, Bld: 171 mg/dL — ABNORMAL HIGH (ref 70–99)
Potassium: 3.4 mmol/L — ABNORMAL LOW (ref 3.5–5.1)
Sodium: 136 mmol/L (ref 135–145)
Total Protein: 6.9 g/dL (ref 6.0–8.3)

## 2015-02-06 LAB — C-REACTIVE PROTEIN: CRP: 13.2 mg/dL — AB (ref <0.60)

## 2015-02-06 NOTE — Progress Notes (Signed)
Labs drawn

## 2015-02-07 LAB — MULTIPLE MYELOMA PANEL, SERUM
ALPHA 1: 0.4 g/dL (ref 0.1–0.4)
ALPHA2 GLOB SERPL ELPH-MCNC: 0.9 g/dL (ref 0.4–1.2)
Albumin SerPl Elph-Mcnc: 3.1 g/dL — ABNORMAL LOW (ref 3.2–5.6)
Albumin/Glob SerPl: 1 (ref 0.7–2.0)
B-Globulin SerPl Elph-Mcnc: 0.9 g/dL (ref 0.6–1.3)
Gamma Glob SerPl Elph-Mcnc: 1.1 g/dL (ref 0.5–1.6)
Globulin, Total: 3.3 g/dL (ref 2.0–4.5)
IGG (IMMUNOGLOBIN G), SERUM: 1022 mg/dL (ref 700–1600)
IGM, SERUM: 21 mg/dL (ref 15–143)
IgA: 302 mg/dL (ref 61–437)
Total Protein ELP: 6.4 g/dL (ref 6.0–8.5)

## 2015-02-07 LAB — KAPPA/LAMBDA LIGHT CHAINS
KAPPA FREE LGHT CHN: 53.16 mg/L — AB (ref 3.30–19.40)
KAPPA, LAMDA LIGHT CHAIN RATIO: 1.66 — AB (ref 0.26–1.65)
LAMDA FREE LIGHT CHAINS: 32.1 mg/L — AB (ref 5.71–26.30)

## 2015-02-07 LAB — BETA 2 MICROGLOBULIN, SERUM: BETA 2 MICROGLOBULIN: 2 mg/L (ref 0.6–2.4)

## 2015-02-09 ENCOUNTER — Encounter (HOSPITAL_BASED_OUTPATIENT_CLINIC_OR_DEPARTMENT_OTHER): Payer: Medicare Other

## 2015-02-09 ENCOUNTER — Encounter (HOSPITAL_BASED_OUTPATIENT_CLINIC_OR_DEPARTMENT_OTHER): Payer: Medicare Other | Admitting: Oncology

## 2015-02-09 VITALS — BP 144/80 | HR 65 | Temp 97.6°F | Resp 18 | Wt 178.0 lb

## 2015-02-09 DIAGNOSIS — D801 Nonfamilial hypogammaglobulinemia: Secondary | ICD-10-CM | POA: Diagnosis not present

## 2015-02-09 DIAGNOSIS — M12812 Other specific arthropathies, not elsewhere classified, left shoulder: Secondary | ICD-10-CM

## 2015-02-09 DIAGNOSIS — Z859 Personal history of malignant neoplasm, unspecified: Secondary | ICD-10-CM | POA: Diagnosis not present

## 2015-02-09 DIAGNOSIS — M75102 Unspecified rotator cuff tear or rupture of left shoulder, not specified as traumatic: Secondary | ICD-10-CM

## 2015-02-09 DIAGNOSIS — F411 Generalized anxiety disorder: Secondary | ICD-10-CM

## 2015-02-09 DIAGNOSIS — M75101 Unspecified rotator cuff tear or rupture of right shoulder, not specified as traumatic: Secondary | ICD-10-CM

## 2015-02-09 DIAGNOSIS — C9001 Multiple myeloma in remission: Secondary | ICD-10-CM

## 2015-02-09 DIAGNOSIS — J069 Acute upper respiratory infection, unspecified: Secondary | ICD-10-CM | POA: Diagnosis not present

## 2015-02-09 DIAGNOSIS — M12811 Other specific arthropathies, not elsewhere classified, right shoulder: Secondary | ICD-10-CM

## 2015-02-09 DIAGNOSIS — Z8572 Personal history of non-Hodgkin lymphomas: Secondary | ICD-10-CM

## 2015-02-09 MED ORDER — SODIUM CHLORIDE 0.9 % IJ SOLN: 10.0000 mL | INTRAMUSCULAR | Status: DC | PRN

## 2015-02-09 MED ORDER — ALPRAZOLAM 0.5 MG PO TABS: 0.5000 mg | ORAL_TABLET | Freq: Every evening | ORAL | Status: DC | PRN

## 2015-02-09 MED ORDER — HEPARIN SOD (PORK) LOCK FLUSH 100 UNIT/ML IV SOLN
500.0000 [IU] | Freq: Once | INTRAVENOUS | Status: AC | PRN
  Administered 2015-02-09: 500 [IU]

## 2015-02-09 MED ORDER — OXYCODONE-ACETAMINOPHEN 5-325 MG PO TABS: 1.0000 | ORAL_TABLET | Freq: Three times a day (TID) | ORAL | Status: DC | PRN

## 2015-02-09 MED ORDER — IMMUNE GLOBULIN (HUMAN) 10 GM/100ML IV SOLN
0.4000 g/kg | Freq: Once | INTRAVENOUS | Status: AC
  Administered 2015-02-09: 35 g via INTRAVENOUS
  Filled 2015-02-09: qty 200

## 2015-02-09 MED ORDER — ACETAMINOPHEN 325 MG PO TABS
650.0000 mg | ORAL_TABLET | Freq: Four times a day (QID) | ORAL | Status: DC | PRN
  Administered 2015-02-09: 650 mg via ORAL
  Filled 2015-02-09: qty 2

## 2015-02-09 MED ORDER — SODIUM CHLORIDE 0.9 % IV SOLN
Freq: Once | INTRAVENOUS | Status: AC
  Administered 2015-02-09: 11:00:00 via INTRAVENOUS

## 2015-02-09 NOTE — Progress Notes (Signed)
Tolerated well

## 2015-02-09 NOTE — Assessment & Plan Note (Signed)
Looks well and NAD.  Does not appear ill.    Temperatures in the clinic today during IVIG infusion: 97.6 F at 1006 hours 98.2 F at 1141 hours 97.8 F at 1210 hours.   Without any sign of fever, I will not change or add to the antibiotic regimen.  He is on Augmentin which I gave him on 3/24 for a 10 day supply.  He is to complete that course of antibiotic. Xray of chest on 3/24 was unimpressive as well.  He was seen by his primary care provider, [place], who started Jori Moll on Flonase and Allegra for possible seasonal allergies.   No change at this time as there is is not a clinical reason to do so.

## 2015-02-09 NOTE — Assessment & Plan Note (Signed)
Continue IVIg (low dose) monthly.  Supportive therapy plan reviewed.

## 2015-02-09 NOTE — Assessment & Plan Note (Addendum)
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 He has been on maintenance Revlimid 10 mg for 7 days on and 7 days off.  CBC from 3/28 is stable.   Continue maintenance Revlimid.  Labs in 8 weeks: CBC diff, CMET, CRP, B2M, MM panel.  Return in 4 weeks for follow-up.  He requests an increase in dosage in Percocet, but he is only taking one tablet in AM and PM and sometimes one during the day.  With this low need for pain medication, I decline increasing the dose, but I wrote a new Rx allowing him to take 1-2 tablets as needed for severe pain.  We may run in to problems in the future regarding prescribing pain medication due to future legislation prohibiting treatment by oncology to treat non-cancer related pain.  He also requests treatment for insomnia, but his sleeping issue is related to a poorly fitting CPAP mask.  I have given a new Rx for Xanax 1-2 tablets at HS as needed.

## 2015-02-09 NOTE — Progress Notes (Addendum)
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 under the care of Dr. Marcell Anger. 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 In 2011 he was diagnosed with IgG lambda multiple myeloma and treated with 4 cycles of Velcade and dexamethasone followed by autologous peripheral blood stem cell transplant and currently remains on maintenance Revlimid and also monthly intravenous IgG.  Zometa 4 mg intravenously monthly was discontinued with last treatment on 08/18/2014.  INTERVAL HISTORY: Jeremy Parrish 74 y.o. 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  I personally reviewed and went over laboratory results with the patient.  The results are noted within this dictation.  Jeremy Parrish reports no improvement in URI.  I started him on an antibiotic on 3/24, Augmentin, x 10 days.  He has 3 or so more days of antibiotics.  He was seen last week by his PCP, Dr. Wolfgang Phoenix, who started him on Allegra and Flonase.  Jeremy Parrish  reports fevers at home up to 99 F, but today during his IVIG infusion, his temperature has been WNL and stable.  He reports greens sputum production.  Clinically, he looks good.  As usually, [name] requests an increase in his pain medication.  He notes that he take 1 tablet of Percocet in the AM and PM and sometimes 1 tablet during the day.  With this infrequency of pain medication need, an increase in pain medication potency, as he is requesting 10 mg tablet, is unreasonable.  Additionally, this is not oncology related pain and future legislation will likely cause [place] to halt treating non-oncologic pain.  He also notes some issues with insomnia and he wants medication to help with this.  He reports that around 2 or 3 AM, his CPAP mask starts leaking around his nose and this causes him to wake up and remove the mask.  He then cannot fall asleep and removes the mask altogether.  He has an intolerance to Benadryl.  This sounds like a CPAP issue and not a primary insomnia problem.  Hematologically, he denies any complaints and ROS questioning is negative.  Past Medical History  Diagnosis Date  . Hypertension   . Heart disease   . Kidney stones     history  . Lung mass   . Heart murmur   . 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 Coronary artery disease   . Depression   . Shortness of breath   . Peripheral arterial disease   . Bladder neck contracture   . Personal history of other diseases of circulatory system   .  Aortic aneurysm of unspecified site without mention of rupture   . Arthritis   . Intestinovesical fistula   . Esophageal reflux   . Hyperlipidemia   . Anemia   . CHF (congestive heart failure)   . COPD (chronic obstructive pulmonary disease)   . Cerebral atherosclerosis     Carotid Doppler, 02/16/2013 - Bilateral Proximal ICAs,demonstrate mild plaque w/o evidence of significant diameter reduction, dissection, or any  other vascular abnormality  . Complication of anesthesia   . PONV (postoperative nausea and vomiting)   . Stroke 2013    Speech.  . Hx of bladder cancer 10/07/2012  . Sleep apnea     05-02-14 cpap , not yet used- suggested settings 5  . Cancer   . Prostate cancer 2000  . Multiple myeloma   . Non Hodgkin's lymphoma   . 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; H/O cardiac pacemaker, Medtronic REVO, MRI conditional device, placed 07/2011 for sympyomatic bradycardia; Hx of bladder cancer; Hyperlipidemia with target LDL less than 100; Prediabetes; Expressive aphasia; Hemiplegia affecting right dominant side; Aneurysm of iliac artery; Shortness of breath; Rotator cuff tear arthropathy of right shoulder; Left rotator cuff tear arthropathy; Muscle weakness (generalized); Status post arthroscopy of shoulder; Pain in joint, shoulder region; Fever; Pneumonia; Pancytopenia; Chest pain with moderate risk for cardiac etiology; Inguinal hernia; Obstructive sleep apnea; Central sleep apnea; Hypokalemia; URI (upper respiratory infection); and Weakness on his problem list.     is allergic to diphenhydramine hcl; morphine and related; and tape.  Mr. [name] had no medications administered during this visit.  Past Surgical History  Procedure Laterality Date  . Prostate surgery    . Heart stents x 5  1999  . Portacath placement  07/26/2009    right chest  . Wrist surgery      right  . Left ear skin cancer removed    . Bone marrow transplant  2011  . Pacemaker insertion  07/22/2011    Medtronic  . Coronary angioplasty   06/24/2000    PCI and stenting in mid & proximal RCA  . Insert / replace / remove pacemaker    . Tee without cardioversion  10/13/2012    Procedure: TRANSESOPHAGEAL ECHOCARDIOGRAM (TEE);  Surgeon: Sanda Klein, MD;  Location: [facility] ENDOSCOPY;  Service: Cardiovascular;  Laterality: N/A;  pat/kay/echo notified  . Colonoscopy N/A 01/01/2013    Procedure: COLONOSCOPY;  Surgeon: Rogene Houston, MD;  Location: [facility]O [facility];  Service: Endoscopy;  Laterality: N/A;  825-moved to [place] notified pt  . Rotator    . Rotator cuff repair Right   . Colon surgery      colon resection  . Bladder surgery    . Shoulder arthroscopy with subacromial decompression Right 07/21/2013    Procedure: RIGHT SHOULDER ARTHROSCOPY WITH SUBACROMIAL DECOMPRESSION AND DEBRIDEMENT & Injection of Left Shoulder;  Surgeon: Alta Corning, MD;  Location: [facility];  Service: Orthopedics;  Laterality: Right;  . US echocardiography  06/19/2011    RV mildly dilated,mild to mod. MR,mild AI,mild PI  . Nm myocar perf wall motion  11/27/2007    inferior scar  . Inguinal hernia repair Right 05/04/2014    Procedure: OPEN RIGHT INGUINAL  HERNIA REPAIR with mesh;  Surgeon: Edward Jolly, MD;  Location: [facility] ORS;  Service: General;  Laterality: Right;    Denies any headaches, dizziness, double vision, fevers, chills, night sweats, nausea, vomiting, diarrhea, constipation, chest pain, heart palpitations, shortness of breath, blood in stool, black tarry stool, urinary pain, urinary burning, urinary frequency, hematuria.   PHYSICAL EXAMINATION  ECOG PERFORMANCE STATUS: 1 - Symptomatic but completely ambulatory  Filed Vitals:   02/09/15 1006  BP: 144/80  Pulse: 65  Temp: 97.6 F (36.4 C)  Resp: 18    GENERAL:alert, no distress, well nourished, well developed, comfortable, cooperative, smiling and aphasia SKIN: skin color, texture, turgor are normal, no rashes or significant lesions HEAD: Normocephalic, No masses, lesions, tenderness or  abnormalities EYES: normal, PERRLA, EOMI, Conjunctiva are pink and non-injected EARS: External ears normal OROPHARYNX:mucous membranes are moist  NECK: supple, no adenopathy, trachea midline LYMPH:  not examined BREAST:not examined LUNGS: clear to auscultation  HEART: regular rate & rhythm ABDOMEN:abdomen soft and normal bowel sounds BACK: Back symmetric, no curvature. EXTREMITIES:less then 2 second capillary refill, no joint deformities, effusion, or inflammation, no skin discoloration, no clubbing, no cyanosis  NEURO: alert & oriented x 3 with fluent speech, no focal motor/sensory deficits, gait normal   LABORATORY DATA: CBC    Component Value Date/Time   WBC 5.1 02/06/2015 0953   RBC 4.20* 02/06/2015 0953   HGB 12.0* 02/06/2015 0953   HCT 36.5* 02/06/2015 0953   PLT 136* 02/06/2015 0953   MCV 86.9 02/06/2015 0953   MCH 28.6 02/06/2015 0953   MCHC 32.9 02/06/2015 0953   RDW 15.7* 02/06/2015 0953   LYMPHSABS 1.4 02/06/2015 0953   MONOABS 0.4 02/06/2015 0953   EOSABS 0.2 02/06/2015 0953   BASOSABS 0.0 02/06/2015 0953      Chemistry      Component Value Date/Time   NA 136 02/06/2015 0953   K 3.4* 02/06/2015 0953   CL 104 02/06/2015 0953   CO2 24 02/06/2015 0953   BUN 12 02/06/2015 0953   CREATININE 1.02 02/06/2015 0953   CREATININE 0.80 11/07/2014 1034      Component Value Date/Time   CALCIUM 8.7 02/06/2015 0953   ALKPHOS 76 02/06/2015 0953   AST 26 02/06/2015 0953   ALT 18 02/06/2015 0953   BILITOT 0.6 02/06/2015 0953     Lab Results  Component Value Date   PROT 6.9 02/06/2015   ALBUMINELP 56.9 01/06/2015   A1GS 5.1* 01/06/2015   A2GS 10.4 01/06/2015   BETS 7.2 01/06/2015   BETA2SER 5.5 01/06/2015   GAMS 14.9 01/06/2015   MSPIKE NOT DETECTED 01/06/2015   SPEI (NOTE) 01/06/2015   SPECOM (NOTE) 01/06/2015   IGGSERUM 1022 02/06/2015   IGA 302 02/06/2015   IGMSERUM 21 02/06/2015   IMMELINT (NOTE) 01/06/2015   KPAFRELGTCHN 53.16* 02/06/2015   LAMBDASER  32.10* 02/06/2015   KAPLAMBRATIO 1.66* 02/06/2015   Lab Results  Component Value Date   IRON 99 04/20/2014   TIBC 365 04/20/2014   FERRITIN 54 04/20/2014     RADIOGRAPHIC STUDIES:  Dg Chest 1 View  02/02/2015   CLINICAL DATA:  Wheezing and congestion.  EXAM: CHEST  1 VIEW  COMPARISON:  11/03/2014.  FINDINGS: Power port catheter noted with lead tip projected over right ventricle. Cardiac pacer with lead tips projected over the right atrium and right ventricle. Heart size is stable. Subsegmental atelectasis and/or scarring both lung bases. No pleural effusion or pneumothorax.  IMPRESSION: 1. Stable chest. Basilar pleural parenchymal scarring .  No acute cardiopulmonary disease. 2. Barbara catheter cardiac pacer in stable position.   Electronically Signed   By: Marcello Moores  Register   On: 02/02/2015 12:46      ASSESSMENT AND PLAN:  Multiple myeloma in remission Pleasant 74 year old male with a history of IgG lambda multiple myeloma status post stem cell transplant in October 2011. He has been on maintenance Revlimid 10 mg for 7 days on and 7 days off.  CBC from 3/28 is stable.   Continue maintenance Revlimid.  Labs in 8 weeks: CBC diff, CMET, CRP, B2M, MM panel.  Return in 4 weeks for follow-up.  He requests an increase in dosage in Percocet, but he is only taking one tablet in AM and PM and sometimes one during the day.  With this low need for pain medication, I decline increasing the dose, but I wrote a new Rx allowing him to take 1-2 tablets as needed for severe pain.  We may run in to problems in the future regarding prescribing pain medication due to future legislation prohibiting treatment by oncology to treat non-cancer related pain.  He also requests treatment for insomnia, but his sleeping issue is related to a poorly fitting CPAP mask.  I have given a new Rx for Xanax 1-2 tablets at HS as needed.    Hypogammaglobulinemia Continue IVIg (low dose) monthly.  Supportive therapy plan  reviewed.    Hx of lymphoma NED   URI (upper respiratory infection) Looks well and NAD.  Does not appear ill.    Temperatures in the clinic today during IVIG infusion: 97.6 F at 1006 hours 98.2 F at 1141 hours 97.8 F at 1210 hours.   Without any sign of fever, I will not change or add to the antibiotic regimen.  He is on Augmentin which I gave him on 3/24 for a 10 day supply.  He is to complete that course of antibiotic. Xray of chest on 3/24 was unimpressive as well.  He was seen by his primary care provider, [place], who started Jori Moll on Flonase and Allegra for possible seasonal allergies.   No change at this time as there is is not a clinical reason to do so.    THERAPY PLAN:  Continue with current therapy.  All questions were answered. The patient knows to call the clinic with any problems, questions or concerns. We can certainly see the patient much sooner if necessary.  Patient and plan discussed with Dr. Ancil Linsey and she is in agreement with the aforementioned.   This note is electronically signed by: Robynn Pane 02/09/2015 3:19 PM

## 2015-02-09 NOTE — Assessment & Plan Note (Signed)
NED

## 2015-02-09 NOTE — Patient Instructions (Signed)
[place] at [facility] Discharge Instructions  RECOMMENDATIONS MADE BY THE CONSULTANT AND ANY TEST RESULTS WILL BE SENT TO YOUR REFERRING PHYSICIAN.  Exam and discussion by Robynn Pane, PA-C Changing your pain med so that you can take 1 or 2 pills every 8 hours as needed for pain and your Xanax to 1 or 2 at bedtime as needed for sleep. Report night sweats, fevers, or other concerns  IVIG every 4 weeks Lab work every 8 weeks Office visit in 8 weeks.  Thank you for choosing [place] at [facility] to provide your oncology and hematology care.  To afford each patient quality time with our provider, please arrive at least 15 minutes before your scheduled appointment time.    You need to re-schedule your appointment should you arrive 10 or more minutes late.  We strive to give you quality time with our providers, and arriving late affects you and other patients whose appointments are after yours.  Also, if you no show three or more times for appointments you may be dismissed from the clinic at the providers discretion.     Again, thank you for choosing [facility].  Our hope is that these requests will decrease the amount of time that you wait before being seen by our physicians.       _____________________________________________________________  Should you have questions after your visit to [facility], please contact our office at [phone] between the hours of 8:30 a.m. and 4:30 p.m.  Voicemails left after 4:30 p.m. will not be returned until the following business day.  For prescription refill requests, have your pharmacy contact our office.

## 2015-02-20 DIAGNOSIS — N320 Bladder-neck obstruction: Secondary | ICD-10-CM | POA: Diagnosis not present

## 2015-02-20 DIAGNOSIS — C61 Malignant neoplasm of prostate: Secondary | ICD-10-CM | POA: Diagnosis not present

## 2015-02-21 ENCOUNTER — Telehealth: Payer: Self-pay | Admitting: Cardiovascular Disease

## 2015-02-22 NOTE — Telephone Encounter (Signed)
Closed encounter °

## 2015-02-23 ENCOUNTER — Ambulatory Visit (INDEPENDENT_AMBULATORY_CARE_PROVIDER_SITE_OTHER): Payer: Medicare Other | Admitting: Cardiovascular Disease

## 2015-02-23 ENCOUNTER — Encounter: Payer: Self-pay | Admitting: Cardiovascular Disease

## 2015-02-23 VITALS — BP 123/76 | HR 62 | Ht 74.0 in | Wt 175.9 lb

## 2015-02-23 DIAGNOSIS — I10 Essential (primary) hypertension: Secondary | ICD-10-CM | POA: Diagnosis not present

## 2015-02-23 DIAGNOSIS — Z950 Presence of cardiac pacemaker: Secondary | ICD-10-CM

## 2015-02-23 DIAGNOSIS — G4731 Primary central sleep apnea: Secondary | ICD-10-CM | POA: Diagnosis not present

## 2015-02-23 DIAGNOSIS — Z8679 Personal history of other diseases of the circulatory system: Secondary | ICD-10-CM

## 2015-02-23 DIAGNOSIS — G4733 Obstructive sleep apnea (adult) (pediatric): Secondary | ICD-10-CM

## 2015-02-23 NOTE — Patient Instructions (Signed)
Your physician wants you to follow-up in: 1 year with Dr. Claiborne Billings in sleep clinic. You will receive a reminder letter in the mail two months in advance. If you don't receive a letter, please call our office to schedule the follow-up appointment.

## 2015-02-25 ENCOUNTER — Encounter: Payer: Self-pay | Admitting: Cardiovascular Disease

## 2015-02-25 NOTE — Progress Notes (Signed)
Patient ID: Jeremy Parrish, male   DOB: [birth year], 74 y.o.   MRN: [id number]     HPI: Jeremy Parrish is a 74 y.o. male who is followed by Dr. Gwenlyn Found for his cardiology care and Dr.Croitoru for his pacemaker.  I had seen him in September 2015 in a sleep clinic following initiation of CPAP therapy.  He presents for follow-up evaluation following a recent BiPAP titration.  Jeremy Parrish has a history of  CAD with prior stenting to his RCA, circumflex vessel, and with his most recent Myoview study in January 2015 revealing lateral scar with an ejection fraction of 35-40%.  He is status post a permanent pacemaker and has a history of hypertension, as well as hyperlipidemia.  He was referred for a polysomnogram, which was done in [place] at [facility].  This was done in a split-night protocol and demonstrated moderate sleep apnea with an AHI of 21.8.  His RDI was 25.8.  On CPAP titration . he was very sensitive to pressure.  He was only titrated between 4-7 cm due to development of central events. He has an AirSense 10 AutoSet unit and he was set at a minimum pressure of 6 and maximum pressure of 20.  When I saw him in September 2015 a download from 06/14/2014 through 07/31/2014 revealed a maximum average pressure of 18.8 cm in his 95th percentile pressure was 17.5.. He's been using a Fisher Paykel Simplus fullface mask, medium. He met  Medicare compliance standards with usage days at 93%.  There was 70% of the time when usage was greater than 4 hours and he was averaging 5 hours and 30 minutes of use.  His AHI was increased at 18.6.  His apnea index was 16.2, and central apnea index 9.6, with obstructive index of 5.8  I saw for follow-up evaluation he was still complaining of hypersomnolence.  He is unaware of breakthrough snoring.  An Epworth scale was calculated, and this is compatible with continued excessive daytime sleepiness as noted below   Epworth Sleepiness Scale: In September  2015: Situation   Chance of Dozing/Sleeping (0 = never , 1 = slight chance , 2 = moderate chance , 3 = high chance )   sitting and reading 3   watching TV 3   sitting inactive in a public place 3   being a passenger in a motor vehicle for an hour or more 2   lying down in the afternoon 3   sitting and talking to someone 0   sitting quietly after lunch (no alcohol) 2   while stopped for a few minutes in traffic as the driver 0   Total Score  16   With CPAP therapy, he was having central apneas with an index of 9.6 on therapy.  As result, I recommended he undergo a BiPAP titration.  His BiPAP titration was done at [facility] on 08/29/2014.  He ultimately was titrated up to 15/10 water pressure.  At pressures above 11/7 he developed increased frequency of central apneic events.  The AHI at 11/7 his AHI was 2.3 with an oxygen nadir of 91% and with only 1 central apneic event.  Central apneas were significantly higher at a 12/8 pressure.  As result, he was recommended to initiate therapy with BiPAP with Bi- Flex/EPR 3 at 11/7 water pressure.   He received his BiPAP unit since his evaluation in the office in December 2015.  A download from November 29 2014 through 12/28/2014  shows 100% compliance with days used.  Usage greater than 4 hours was at 73% of the days.'s averaging 5 hours and 45 minutes.  He has had difficulty with sleep maintenance.  Despite initially falling asleep.  On BiPAP at it.  IPAP pressure of 11, and EPAP pressure of 7 index is 20.6 and I popped the index 2.0, giving him an AHI of 22.6.  Although the ResMed download does not differentiate between central versus obstructive events, my suspicion was that most of these apneic events may very well be central events.  When I saw him in February as an 16  anEpworth Sleepiness Scale score was recalculated today in the office this endorsed at 14 which is compatible with excessive daytime sleepiness.  He had a high chance of dozing  while sitting and reading, sitting, inactive in a public place, and had moderate chance of dozing while watching TV, as a passenger in a car for an hour without a break, lying down to rest in the afternoon when circumstances permit, and while sitting quietly after lunch without alcohol.  Since I last saw him, I changed him to a BiPAP auto unit with a pressure support of 4.  A new download was obtained from 01/21/2015 through 02/19/2015.  This was notable in that he had very severe leak which may account for high AHI of 17.6. However, this this is improved from his previous download. He had 4.1 obstructive apneas and 3.1 central apneas.  He feels improved with the BiPAP unit.    A new Epworth Sleepiness Scale today was calculated and this endorsed at 5 with only a slight chance of dozing while sitting, being inactive in a public place and has a passenger in a car for an hour.  He still has a high chance of dozing lying down to rest in the afternoon when circumstances permit.  Past Medical History  Diagnosis Date  . Hypertension   . Heart disease   . Kidney stones     history  . Lung mass   . Heart murmur   . 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 Coronary artery disease   . Depression   . Shortness of breath   . Peripheral arterial disease   . Bladder neck contracture   . Personal history of other diseases of circulatory system   . Aortic aneurysm of unspecified site without mention of rupture   . Arthritis   . Intestinovesical fistula   . Esophageal reflux   . Hyperlipidemia   . Anemia   . CHF (congestive heart failure)   . COPD (chronic obstructive pulmonary disease)   . Cerebral atherosclerosis     Carotid Doppler, 02/16/2013 - Bilateral Proximal ICAs,demonstrate mild plaque w/o evidence of significant diameter reduction, dissection, or any other vascular abnormality  . Complication of anesthesia   . PONV (postoperative  nausea and vomiting)   . Stroke 2013    Speech.  . Hx of bladder cancer 10/07/2012  . Sleep apnea     05-02-14 cpap , not yet used- suggested settings 5  . Cancer   . Prostate cancer 2000  . Multiple myeloma   . Non Hodgkin's lymphoma   . Myocardial infarction     '96    Past Surgical History  Procedure Laterality Date  . Prostate surgery    . Heart stents x 5  1999  . Portacath placement  07/26/2009    right chest  .  Wrist surgery      right  . Left ear skin cancer removed    . Bone marrow transplant  2011  . Pacemaker insertion  07/22/2011    [employer]  . Coronary angioplasty  06/24/2000    PCI and stenting in mid & proximal RCA  . Insert / replace / remove pacemaker    . Tee without cardioversion  10/13/2012    Procedure: TRANSESOPHAGEAL ECHOCARDIOGRAM (TEE);  Surgeon: Sanda Klein, MD;  Location: [facility] ENDOSCOPY;  Service: Cardiovascular;  Laterality: N/A;  pat/kay/echo notified  . Colonoscopy N/A 01/01/2013    Procedure: COLONOSCOPY;  Surgeon: Rogene Houston, MD;  Location: [facility] SUITE;  Service: Endoscopy;  Laterality: N/A;  825-moved to [place] notified pt  . Rotator    . Rotator cuff repair Right   . Colon surgery      colon resection  . Bladder surgery    . Shoulder arthroscopy with subacromial decompression Right 07/21/2013    Procedure: RIGHT SHOULDER ARTHROSCOPY WITH SUBACROMIAL DECOMPRESSION AND DEBRIDEMENT & Injection of Left Shoulder;  Surgeon: Alta Corning, MD;  Location: [facility];  Service: Orthopedics;  Laterality: Right;  . US echocardiography  06/19/2011    RV mildly dilated,mild to mod. MR,mild AI,mild PI  . Nm myocar perf wall motion  11/27/2007    inferior scar  . Inguinal hernia repair Right 05/04/2014    Procedure: OPEN RIGHT INGUINAL HERNIA REPAIR with mesh;  Surgeon: Edward Jolly, MD;  Location: [facility] ORS;  Service: General;  Laterality: Right;    Allergies  Allergen Reactions  . Diphenhydramine Hcl     REACTION: "hyper"  . Morphine And Related  Other (See Comments)    hallucinations  . Tape Rash    Paper tape is ok    Current Outpatient Prescriptions  Medication Sig Dispense Refill  . albuterol (PROVENTIL HFA;VENTOLIN HFA) 108 (90 BASE) MCG/ACT inhaler Inhale 2 puffs into the lungs every 6 (six) hours as needed for wheezing or shortness of breath. 1 Inhaler 2  . ALPRAZolam (XANAX) 0.5 MG tablet Take 1-2 tablets (0.5-1 mg total) by mouth at bedtime as needed for anxiety. 90 tablet 1  . amLODipine (NORVASC) 10 MG tablet Take 1 tablet (10 mg total) by mouth every morning. 90 tablet 1  . aspirin EC 81 MG tablet Take 81 mg by mouth at bedtime.    . celecoxib (CELEBREX) 100 MG capsule Take 1 capsule (100 mg total) by mouth 2 (two) times daily. 60 capsule 3  . citalopram (CELEXA) 40 MG tablet Take 1 tablet (40 mg total) by mouth daily. 90 tablet 3  . clopidogrel (PLAVIX) 75 MG tablet TAKE 1 TABLET BY MOUTH EVERY DAY 90 tablet 3  . fexofenadine (ALLEGRA ALLERGY) 180 MG tablet Take 1 tablet (180 mg total) by mouth daily. 30 tablet 5  . fluticasone (FLONASE) 50 MCG/ACT nasal spray Place 2 sprays into both nostrils daily. 16 g 6  . lenalidomide (REVLIMID) 10 MG capsule Take 1 capsule (10 mg total) by mouth daily. Take 1 capsule by mouth daily for 7 days on followed by 7 days off 14 capsule 0  . nitroGLYCERIN (NITROSTAT) 0.4 MG SL tablet Place 1 tablet (0.4 mg total) under the tongue every 5 (five) minutes as needed for chest pain. 25 tablet 12  . omeprazole (PRILOSEC) 20 MG capsule Take 1 capsule (20 mg total) by mouth daily. 90 capsule 1  . ondansetron (ZOFRAN) 4 MG tablet Take 1 tablet (4 mg total) by mouth  every 8 (eight) hours as needed for nausea or vomiting. 30 tablet 2  . oxyCODONE-acetaminophen (PERCOCET/ROXICET) 5-325 MG per tablet Take 1-2 tablets by mouth every 8 (eight) hours as needed for severe pain. 90 tablet 0  . potassium chloride 20 MEQ TBCR Take 20 mEq by mouth daily. 30 tablet 0  . simvastatin (ZOCOR) 20 MG tablet Take 1  tablet (20 mg total) by mouth at bedtime. 90 tablet 2  . traMADol (ULTRAM) 50 MG tablet Take 1 tablet (50 mg total) by mouth every 8 (eight) hours as needed. (Patient taking differently: Take 50 mg by mouth every 8 (eight) hours as needed (pain). ) 60 tablet 2  . zolpidem (AMBIEN CR) 6.25 MG CR tablet Take 1 tablet (6.25 mg total) by mouth at bedtime as needed for sleep. 30 tablet 0   No current facility-administered medications for this visit.   Facility-Administered Medications Ordered in Other Visits  Medication Dose Route Frequency Provider Last Rate Last Dose  . acetaminophen (TYLENOL) tablet 650 mg  650 mg Oral Q6H PRN Baird Cancer, PA-C   650 mg at 12/09/14 0855  . sodium chloride 0.9 % injection 10 mL  10 mL Intracatheter PRN Baird Cancer, PA-C   10 mL at 12/09/14 3235    History   Social History  . Marital Status: Married    Spouse Name: [name]  . Number of Children: 3  . Years of Education: 9th   Occupational History  . retired     Social History Main Topics  . Smoking status: Former Smoker -- 1.00 packs/day for 20 years    Types: Cigarettes    Quit date: 11/14/1994  . Smokeless tobacco: Never Used  . Alcohol Use: No     Comment: previously drank but none for at least 15 years.  . Drug Use: No  . Sexual Activity: Not on file   Other Topics Concern  . Not on file   Social History Narrative   Patient lives at home spouse.   Caffeine Use: Occasionally     ROS General: Negative; No fevers, chills, or night sweats HEENT: Negative; No changes in vision or hearing, sinus congestion, difficulty swallowing Pulmonary: Negative; No cough, wheezing, shortness of breath, hemoptysis Cardiovascular: Negative; No chest pain, presyncope, syncope, palpatations GI: Positive for GERD; No nausea, vomiting, diarrhea, or abdominal pain GU: Negative; No dysuria, hematuria, or difficulty voiding Musculoskeletal: Negative; no myalgias, joint pain, or weakness Hematologic:  Negative; no easy bruising, bleeding Endocrine: Negative; no heat/cold intolerance Neuro: Negative; no changes in balance, headaches Skin: Negative; No rashes or skin lesions Psychiatric: Negative; No behavioral problems, depression Sleep: Positive for obstructive sleep apnea with daytime sleepiness, hypersomnolence,  nobruxism, restless legs, hypnogognic hallucinations, no cataplexy   Physical Exam BP 123/76 mmHg  Pulse 62  Ht 6' 2"  (1.88 m)  Wt 175 lb 14.4 oz (79.788 kg)  BMI 22.57 kg/m2   Wt Readings from Last 3 Encounters:  02/23/15 175 lb 14.4 oz (79.788 kg)  02/09/15 178 lb (80.74 kg)  01/30/15 180 lb (81.647 kg)   General: Alert, oriented, no distress.  Skin: normal turgor, no rashes HEENT: Normocephalic, atraumatic. Pupils round and reactive; sclera anicteric; extraocular muscles intact; Fundi with mild arteriolar narrowing without hemorrhages or exudates Nose without nasal septal hypertrophy Mouth/Parynx benign; Mallinpatti scale 3  Neck: No JVD, no carotid bruits with normal carotid upstroke Lungs: clear to ausculatation and percussion; no wheezing or rales  Chest wall: No tenderness to palpation Heart: RRR, s1  s2 normal 2/6 systolic murmur loudest at the apex;  No S3 gallop.  No diastolic murmur.  No rubs thrills or heaves Abdomen: soft, nontender; no hepatosplenomehaly, BS+; abdominal aorta nontender and not dilated by palpation. Back: No CVA tenderness Pulses 2+ Extremities: no clubbinbg cyanosis or edema, Homan's sign negative  Neurologic: grossly nonfocal; cranial nerves intact. Psychological: Normal affect and mood.   LABS:  BMP Latest Ref Rng 02/06/2015 01/06/2015 12/09/2014  Glucose 70 - 99 mg/dL 171(H) 118(H) 88  BUN 6 - 23 mg/dL 12 7 8   Creatinine 0.50 - 1.35 mg/dL 1.02 0.70 0.89  Sodium 135 - 145 mmol/L 136 141 138  Potassium 3.5 - 5.1 mmol/L 3.4(L) 3.5 3.7  Chloride 96 - 112 mmol/L 104 108 109  CO2 19 - 32 mmol/L 24 28 25   Calcium 8.4 - 10.5 mg/dL 8.7  8.9 8.7    Hepatic Function Latest Ref Rng 02/06/2015 01/06/2015 01/06/2015  Total Protein 6.0 - 8.3 g/dL 6.9 6.7 6.6  Albumin 3.5 - 5.2 g/dL 3.5 3.8 -  AST 0 - 37 U/L 26 23 -  ALT 0 - 53 U/L 18 17 -  Alk Phosphatase 39 - 117 U/L 76 100 -  Total Bilirubin 0.3 - 1.2 mg/dL 0.6 0.7 -  Bilirubin, Direct 0.0 - 0.3 mg/dL - - -    CBC Latest Ref Rng 02/06/2015 01/06/2015 12/09/2014  WBC 4.0 - 10.5 K/uL 5.1 2.4(L) 2.5(L)  Hemoglobin 13.0 - 17.0 g/dL 12.0(L) 12.0(L) 11.2(L)  Hematocrit 39.0 - 52.0 % 36.5(L) 36.9(L) 35.6(L)  Platelets 150 - 400 K/uL 136(L) 98(L) 90(L)   No results found for: TSH  BNP    Component Value Date/Time   PROBNP 119.0* 04/19/2010 1158    Lipid Panel     Component Value Date/Time   CHOL 104 12/27/2014 0844   TRIG 101 12/27/2014 0844   HDL 35* 12/27/2014 0844   CHOLHDL 3.0 12/27/2014 0844   VLDL 20 12/27/2014 0844   LDLCALC 49 12/27/2014 0844     RADIOLOGY: No results found.    ASSESSMENT AND PLAN: Jeremy Parrish is a 74 year old male who has significant cardiac disease including a long-standing CAD dating back to 1996, hypertension, hyperlipidemia, and  is status post permanent pacemaker insertion.  He has complex sleep apnea and has derived benefit with BiPAP compared to CPAP therapy.  His recent download with BiPAP auto unit is improved, but I suspect a major component of his continued high AHI at 17.6/hr is a 95th percentile leak of 78.3 L/m with a maximum of 103.8.  I suspect he needs a new cushion to his mask, not providing adequate support.  I will also decrease his IPAP maximum pressure from 25-20.  He has lost 5 pounds since his last visit.  His blood pressure today is well controlled on his current therapy.  He is not having palpitations.  He denies dizziness.  A follow-up download will be obtained with his new equipment and further recommendations will be made at that time.   Time spent: 25 minutes   Troy Sine, MD, [facility]  02/25/2015 12:48  PM

## 2015-02-28 DIAGNOSIS — M546 Pain in thoracic spine: Secondary | ICD-10-CM | POA: Diagnosis not present

## 2015-02-28 DIAGNOSIS — M9902 Segmental and somatic dysfunction of thoracic region: Secondary | ICD-10-CM | POA: Diagnosis not present

## 2015-02-28 DIAGNOSIS — M75122 Complete rotator cuff tear or rupture of left shoulder, not specified as traumatic: Secondary | ICD-10-CM | POA: Diagnosis not present

## 2015-02-28 DIAGNOSIS — M9907 Segmental and somatic dysfunction of upper extremity: Secondary | ICD-10-CM | POA: Diagnosis not present

## 2015-03-01 DIAGNOSIS — M9907 Segmental and somatic dysfunction of upper extremity: Secondary | ICD-10-CM | POA: Diagnosis not present

## 2015-03-01 DIAGNOSIS — M9902 Segmental and somatic dysfunction of thoracic region: Secondary | ICD-10-CM | POA: Diagnosis not present

## 2015-03-01 DIAGNOSIS — M75122 Complete rotator cuff tear or rupture of left shoulder, not specified as traumatic: Secondary | ICD-10-CM | POA: Diagnosis not present

## 2015-03-01 DIAGNOSIS — M546 Pain in thoracic spine: Secondary | ICD-10-CM | POA: Diagnosis not present

## 2015-03-02 ENCOUNTER — Other Ambulatory Visit (HOSPITAL_COMMUNITY): Payer: Self-pay | Admitting: Oncology

## 2015-03-02 ENCOUNTER — Ambulatory Visit (HOSPITAL_COMMUNITY): Payer: Medicare Other

## 2015-03-02 DIAGNOSIS — C9000 Multiple myeloma not having achieved remission: Secondary | ICD-10-CM

## 2015-03-02 MED ORDER — LENALIDOMIDE 10 MG PO CAPS: 10.0000 mg | ORAL_CAPSULE | Freq: Every day | ORAL | Status: DC

## 2015-03-03 ENCOUNTER — Ambulatory Visit: Payer: Medicare Other | Admitting: Cardiovascular Disease

## 2015-03-03 DIAGNOSIS — M75122 Complete rotator cuff tear or rupture of left shoulder, not specified as traumatic: Secondary | ICD-10-CM | POA: Diagnosis not present

## 2015-03-03 DIAGNOSIS — M9907 Segmental and somatic dysfunction of upper extremity: Secondary | ICD-10-CM | POA: Diagnosis not present

## 2015-03-03 DIAGNOSIS — M9902 Segmental and somatic dysfunction of thoracic region: Secondary | ICD-10-CM | POA: Diagnosis not present

## 2015-03-03 DIAGNOSIS — M546 Pain in thoracic spine: Secondary | ICD-10-CM | POA: Diagnosis not present

## 2015-03-06 ENCOUNTER — Other Ambulatory Visit (HOSPITAL_COMMUNITY): Payer: Self-pay | Admitting: Oncology

## 2015-03-06 ENCOUNTER — Other Ambulatory Visit (HOSPITAL_COMMUNITY): Payer: Medicare Other

## 2015-03-06 DIAGNOSIS — C9000 Multiple myeloma not having achieved remission: Secondary | ICD-10-CM

## 2015-03-06 MED ORDER — LENALIDOMIDE 10 MG PO CAPS: 10.0000 mg | ORAL_CAPSULE | Freq: Every day | ORAL | Status: DC

## 2015-03-07 DIAGNOSIS — M9902 Segmental and somatic dysfunction of thoracic region: Secondary | ICD-10-CM | POA: Diagnosis not present

## 2015-03-07 DIAGNOSIS — M9907 Segmental and somatic dysfunction of upper extremity: Secondary | ICD-10-CM | POA: Diagnosis not present

## 2015-03-07 DIAGNOSIS — M546 Pain in thoracic spine: Secondary | ICD-10-CM | POA: Diagnosis not present

## 2015-03-07 DIAGNOSIS — M75122 Complete rotator cuff tear or rupture of left shoulder, not specified as traumatic: Secondary | ICD-10-CM | POA: Diagnosis not present

## 2015-03-08 DIAGNOSIS — M9902 Segmental and somatic dysfunction of thoracic region: Secondary | ICD-10-CM | POA: Diagnosis not present

## 2015-03-08 DIAGNOSIS — M9907 Segmental and somatic dysfunction of upper extremity: Secondary | ICD-10-CM | POA: Diagnosis not present

## 2015-03-08 DIAGNOSIS — M75122 Complete rotator cuff tear or rupture of left shoulder, not specified as traumatic: Secondary | ICD-10-CM | POA: Diagnosis not present

## 2015-03-08 DIAGNOSIS — M546 Pain in thoracic spine: Secondary | ICD-10-CM | POA: Diagnosis not present

## 2015-03-09 ENCOUNTER — Encounter (HOSPITAL_COMMUNITY): Payer: Medicare Other | Attending: Oncology

## 2015-03-09 VITALS — BP 120/65 | HR 60 | Temp 97.8°F | Resp 16 | Wt 176.8 lb

## 2015-03-09 DIAGNOSIS — D801 Nonfamilial hypogammaglobulinemia: Secondary | ICD-10-CM

## 2015-03-09 MED ORDER — HEPARIN SOD (PORK) LOCK FLUSH 100 UNIT/ML IV SOLN: 250.0000 [IU] | Freq: Once | INTRAVENOUS | Status: DC | PRN

## 2015-03-09 MED ORDER — SODIUM CHLORIDE 0.9 % IV SOLN
Freq: Once | INTRAVENOUS | Status: AC
  Administered 2015-03-09: 10:00:00 via INTRAVENOUS

## 2015-03-09 MED ORDER — SODIUM CHLORIDE 0.9 % IJ SOLN: 3.0000 mL | Freq: Once | INTRAMUSCULAR | Status: DC | PRN

## 2015-03-09 MED ORDER — ACETAMINOPHEN 325 MG PO TABS
650.0000 mg | ORAL_TABLET | Freq: Four times a day (QID) | ORAL | Status: DC | PRN
  Administered 2015-03-09: 650 mg via ORAL
  Filled 2015-03-09: qty 2

## 2015-03-09 MED ORDER — ALTEPLASE 2 MG IJ SOLR: 2.0000 mg | Freq: Once | INTRAMUSCULAR | Status: DC | PRN

## 2015-03-09 MED ORDER — HEPARIN SOD (PORK) LOCK FLUSH 100 UNIT/ML IV SOLN
500.0000 [IU] | Freq: Once | INTRAVENOUS | Status: AC | PRN
  Administered 2015-03-09: 500 [IU]
  Filled 2015-03-09: qty 5

## 2015-03-09 MED ORDER — IMMUNE GLOBULIN (HUMAN) 10 GM/100ML IV SOLN
0.4000 g/kg | Freq: Once | INTRAVENOUS | Status: AC
  Administered 2015-03-09: 30 g via INTRAVENOUS
  Filled 2015-03-09: qty 200

## 2015-03-09 MED ORDER — SODIUM CHLORIDE 0.9 % IJ SOLN
10.0000 mL | INTRAMUSCULAR | Status: DC | PRN
  Administered 2015-03-09: 10 mL
  Filled 2015-03-09: qty 10

## 2015-03-09 NOTE — Progress Notes (Signed)
Tolerated IVIG infusion without incident. D/C home ambulatory.

## 2015-03-09 NOTE — Patient Instructions (Signed)
[place] at [facility] Discharge Instructions  RECOMMENDATIONS MADE BY THE CONSULTANT AND ANY TEST RESULTS WILL BE SENT TO YOUR REFERRING PHYSICIAN.  IVIG given today as ordered. Return as scheduled.  Thank you for choosing [place] at [facility] to provide your oncology and hematology care.  To afford each patient quality time with our provider, please arrive at least 15 minutes before your scheduled appointment time.    You need to re-schedule your appointment should you arrive 10 or more minutes late.  We strive to give you quality time with our providers, and arriving late affects you and other patients whose appointments are after yours.  Also, if you no show three or more times for appointments you may be dismissed from the clinic at the providers discretion.     Again, thank you for choosing [facility].  Our hope is that these requests will decrease the amount of time that you wait before being seen by our physicians.       _____________________________________________________________  Should you have questions after your visit to [facility], please contact our office at [phone] between the hours of 8:30 a.m. and 4:30 p.m.  Voicemails left after 4:30 p.m. will not be returned until the following business day.  For prescription refill requests, have your pharmacy contact our office.

## 2015-03-13 ENCOUNTER — Encounter: Payer: Self-pay | Admitting: Cardiovascular Disease

## 2015-03-13 ENCOUNTER — Encounter: Payer: Medicare Other | Admitting: Cardiovascular Disease

## 2015-03-13 NOTE — Progress Notes (Signed)
This encounter was created in error - please disregard.

## 2015-03-14 ENCOUNTER — Other Ambulatory Visit: Payer: Self-pay | Admitting: Family Medicine

## 2015-03-14 DIAGNOSIS — M9907 Segmental and somatic dysfunction of upper extremity: Secondary | ICD-10-CM | POA: Diagnosis not present

## 2015-03-14 DIAGNOSIS — M75122 Complete rotator cuff tear or rupture of left shoulder, not specified as traumatic: Secondary | ICD-10-CM | POA: Diagnosis not present

## 2015-03-14 DIAGNOSIS — M546 Pain in thoracic spine: Secondary | ICD-10-CM | POA: Diagnosis not present

## 2015-03-14 DIAGNOSIS — M9902 Segmental and somatic dysfunction of thoracic region: Secondary | ICD-10-CM | POA: Diagnosis not present

## 2015-03-16 ENCOUNTER — Encounter: Payer: Self-pay | Admitting: Family Medicine

## 2015-03-16 ENCOUNTER — Other Ambulatory Visit: Payer: Self-pay | Admitting: Family Medicine

## 2015-03-16 ENCOUNTER — Ambulatory Visit (INDEPENDENT_AMBULATORY_CARE_PROVIDER_SITE_OTHER): Payer: Medicare Other | Admitting: Family Medicine

## 2015-03-16 VITALS — BP 128/70 | Temp 98.5°F | Ht 68.0 in | Wt 176.0 lb

## 2015-03-16 DIAGNOSIS — I2510 Atherosclerotic heart disease of native coronary artery without angina pectoris: Secondary | ICD-10-CM

## 2015-03-16 DIAGNOSIS — R55 Syncope and collapse: Secondary | ICD-10-CM

## 2015-03-16 DIAGNOSIS — M9902 Segmental and somatic dysfunction of thoracic region: Secondary | ICD-10-CM | POA: Diagnosis not present

## 2015-03-16 DIAGNOSIS — M75122 Complete rotator cuff tear or rupture of left shoulder, not specified as traumatic: Secondary | ICD-10-CM | POA: Diagnosis not present

## 2015-03-16 DIAGNOSIS — G4489 Other headache syndrome: Secondary | ICD-10-CM | POA: Diagnosis not present

## 2015-03-16 DIAGNOSIS — M546 Pain in thoracic spine: Secondary | ICD-10-CM | POA: Diagnosis not present

## 2015-03-16 DIAGNOSIS — M9907 Segmental and somatic dysfunction of upper extremity: Secondary | ICD-10-CM | POA: Diagnosis not present

## 2015-03-16 MED ORDER — TRAMADOL HCL 50 MG PO TABS: 50.0000 mg | ORAL_TABLET | Freq: Four times a day (QID) | ORAL | Status: DC | PRN

## 2015-03-16 NOTE — Progress Notes (Signed)
   Subjective:    Patient ID: Jeremy Parrish, male    DOB: 05-22-1941, 75 y.o.   MRN: [id number]  Headache  This is a new problem. Episode onset: 2 -3 days ago. He has tried acetaminophen and NSAIDs for the symptoms.   Pt states he passed out 2 weeks ago. He was sitting in chair when he stood he fainted and he hit his face. On further history patient thinks he blanked out just momentarily. Enough time to lose his balance. [place] forward immediately woke up. No headache at that time no chest pain no palpitations.  Patient does note when he stands up quickly that he becomes lightheaded.  Headache the last several days. Primarily the top of the head. No neck pain. Not particularly severe to segregating. Over-the-counter Tylenol not helping. No throat pain no fever no congestion no drainage no further loss of consciousness. Sleeps well at night   Review of Systems  Neurological: Positive for headaches.   No vomiting no diarrhea    Objective:   Physical Exam Alert pleasant no apparent distress blood pressure 1 3478 on repeat both arms HEENT normal neck supple. Neuro intact lungs clear. Heart regular in rhythm.       Assessment & Plan:  Impression #1 syncopal event on further discussion likely secondary #2 #2 orthostatic hypotension discussed at length #3 headache nonspecific. No accompanying warning signs at this time. Plan tramadol when necessary for headache. 1 signs discussed. Recheck if persists. We'll keep Norvasc same level due to risk factors discussed WSL

## 2015-03-20 DIAGNOSIS — M75122 Complete rotator cuff tear or rupture of left shoulder, not specified as traumatic: Secondary | ICD-10-CM | POA: Diagnosis not present

## 2015-03-20 DIAGNOSIS — M546 Pain in thoracic spine: Secondary | ICD-10-CM | POA: Diagnosis not present

## 2015-03-20 DIAGNOSIS — M9902 Segmental and somatic dysfunction of thoracic region: Secondary | ICD-10-CM | POA: Diagnosis not present

## 2015-03-20 DIAGNOSIS — M9907 Segmental and somatic dysfunction of upper extremity: Secondary | ICD-10-CM | POA: Diagnosis not present

## 2015-03-22 DIAGNOSIS — M9907 Segmental and somatic dysfunction of upper extremity: Secondary | ICD-10-CM | POA: Diagnosis not present

## 2015-03-22 DIAGNOSIS — M75122 Complete rotator cuff tear or rupture of left shoulder, not specified as traumatic: Secondary | ICD-10-CM | POA: Diagnosis not present

## 2015-03-22 DIAGNOSIS — M9902 Segmental and somatic dysfunction of thoracic region: Secondary | ICD-10-CM | POA: Diagnosis not present

## 2015-03-22 DIAGNOSIS — M546 Pain in thoracic spine: Secondary | ICD-10-CM | POA: Diagnosis not present

## 2015-03-28 ENCOUNTER — Telehealth: Payer: Self-pay | Admitting: Cardiovascular Disease

## 2015-03-28 ENCOUNTER — Encounter: Payer: Medicare Other | Admitting: *Deleted

## 2015-03-28 NOTE — Telephone Encounter (Signed)
Informed pt that transmission was received. Patient voiced understanding.

## 2015-03-28 NOTE — Telephone Encounter (Signed)
He wants to know if you received his transmission this morning?

## 2015-03-31 ENCOUNTER — Encounter: Payer: Self-pay | Admitting: Cardiology

## 2015-03-31 ENCOUNTER — Other Ambulatory Visit (HOSPITAL_COMMUNITY): Payer: Self-pay | Admitting: Oncology

## 2015-03-31 DIAGNOSIS — C9000 Multiple myeloma not having achieved remission: Secondary | ICD-10-CM

## 2015-03-31 MED ORDER — LENALIDOMIDE 10 MG PO CAPS: 10.0000 mg | ORAL_CAPSULE | Freq: Every day | ORAL | Status: DC

## 2015-04-03 ENCOUNTER — Encounter (HOSPITAL_COMMUNITY): Payer: Medicare Other | Attending: Oncology

## 2015-04-03 DIAGNOSIS — Z8572 Personal history of non-Hodgkin lymphomas: Secondary | ICD-10-CM

## 2015-04-03 DIAGNOSIS — Z859 Personal history of malignant neoplasm, unspecified: Secondary | ICD-10-CM

## 2015-04-03 DIAGNOSIS — C9000 Multiple myeloma not having achieved remission: Secondary | ICD-10-CM | POA: Insufficient documentation

## 2015-04-03 DIAGNOSIS — C9001 Multiple myeloma in remission: Secondary | ICD-10-CM

## 2015-04-03 LAB — COMPREHENSIVE METABOLIC PANEL
ALBUMIN: 3.9 g/dL (ref 3.5–5.0)
ALK PHOS: 67 U/L (ref 38–126)
ALT: 18 U/L (ref 17–63)
AST: 23 U/L (ref 15–41)
Anion gap: 7 (ref 5–15)
BUN: 10 mg/dL (ref 6–20)
CO2: 27 mmol/L (ref 22–32)
Calcium: 8.9 mg/dL (ref 8.9–10.3)
Chloride: 105 mmol/L (ref 101–111)
Creatinine, Ser: 0.91 mg/dL (ref 0.61–1.24)
GFR calc non Af Amer: >60 mL/min (ref >60)
Glucose, Bld: 93 mg/dL (ref 65–99)
POTASSIUM: 3.8 mmol/L (ref 3.5–5.1)
SODIUM: 139 mmol/L (ref 135–145)
Total Bilirubin: 0.7 mg/dL (ref 0.3–1.2)
Total Protein: 7 g/dL (ref 6.5–8.1)

## 2015-04-03 LAB — CBC WITH DIFFERENTIAL/PLATELET
Basophils Absolute: 0 10*3/uL (ref 0.0–0.1)
Basophils Relative: 1 % (ref 0–1)
EOS PCT: 3 % (ref 0–5)
Eosinophils Absolute: 0.1 10*3/uL (ref 0.0–0.7)
HCT: 38.7 % — ABNORMAL LOW (ref 39.0–52.0)
Hemoglobin: 12.5 g/dL — ABNORMAL LOW (ref 13.0–17.0)
Lymphocytes Relative: 42 % (ref 12–46)
Lymphs Abs: 1.5 10*3/uL (ref 0.7–4.0)
MCH: 28.5 pg (ref 26.0–34.0)
MCHC: 32.3 g/dL (ref 30.0–36.0)
MCV: 88.4 fL (ref 78.0–100.0)
Monocytes Absolute: 0.4 10*3/uL (ref 0.1–1.0)
Monocytes Relative: 12 % (ref 3–12)
NEUTROS ABS: 1.5 10*3/uL — AB (ref 1.7–7.7)
Neutrophils Relative %: 43 % (ref 43–77)
Platelets: 116 10*3/uL — ABNORMAL LOW (ref 150–400)
RBC: 4.38 MIL/uL (ref 4.22–5.81)
RDW: 15 % (ref 11.5–15.5)
WBC: 3.5 10*3/uL — ABNORMAL LOW (ref 4.0–10.5)

## 2015-04-03 LAB — C-REACTIVE PROTEIN: CRP: <0.5 mg/dL (ref <1.0)

## 2015-04-03 NOTE — Progress Notes (Signed)
Labs drawn

## 2015-04-04 ENCOUNTER — Telehealth: Payer: Self-pay | Admitting: Cardiovascular Disease

## 2015-04-04 LAB — MULTIPLE MYELOMA PANEL, SERUM
ALBUMIN SERPL ELPH-MCNC: 3.6 g/dL (ref 3.2–5.6)
Albumin/Glob SerPl: 1.3 (ref 0.7–2.0)
Alpha 1: 0.2 g/dL (ref 0.1–0.4)
Alpha2 Glob SerPl Elph-Mcnc: 0.7 g/dL (ref 0.4–1.2)
B-Globulin SerPl Elph-Mcnc: 1 g/dL (ref 0.6–1.3)
Gamma Glob SerPl Elph-Mcnc: 1.1 g/dL (ref 0.5–1.6)
Globulin, Total: 3 g/dL (ref 2.0–4.5)
IGA: 338 mg/dL (ref 61–437)
IGG (IMMUNOGLOBIN G), SERUM: 1009 mg/dL (ref 700–1600)
IGM, SERUM: 23 mg/dL (ref 15–143)
Total Protein ELP: 6.6 g/dL (ref 6.0–8.5)

## 2015-04-04 LAB — BETA 2 MICROGLOBULIN, SERUM: BETA 2 MICROGLOBULIN: 1.9 mg/L (ref 0.6–2.4)

## 2015-04-04 LAB — KAPPA/LAMBDA LIGHT CHAINS
Kappa free light chain: 34.14 mg/L — ABNORMAL HIGH (ref 3.30–19.40)
Kappa, lambda light chain ratio: 1.26 (ref 0.26–1.65)
LAMDA FREE LIGHT CHAINS: 27.05 mg/L — AB (ref 5.71–26.30)

## 2015-04-04 NOTE — Telephone Encounter (Signed)
Pt called in requesting that a new prescription for his CPAP machine be sent in to [place] . Please f/u with the pt   Thanks

## 2015-04-05 ENCOUNTER — Ambulatory Visit: Payer: Medicare Other | Admitting: Cardiovascular Disease

## 2015-04-05 NOTE — Telephone Encounter (Signed)
Patient is calling again about a new RX for his CPAP machine to be sent to [facility].

## 2015-04-06 ENCOUNTER — Encounter (HOSPITAL_BASED_OUTPATIENT_CLINIC_OR_DEPARTMENT_OTHER): Payer: Medicare Other | Admitting: Hematology & Oncology

## 2015-04-06 ENCOUNTER — Encounter (HOSPITAL_COMMUNITY): Payer: Self-pay | Admitting: Hematology & Oncology

## 2015-04-06 ENCOUNTER — Encounter (HOSPITAL_COMMUNITY): Payer: Medicare Other | Attending: Hematology & Oncology

## 2015-04-06 VITALS — BP 130/75 | HR 61 | Temp 97.7°F | Resp 16

## 2015-04-06 VITALS — BP 114/67 | HR 61 | Temp 98.0°F | Resp 18 | Wt 175.2 lb

## 2015-04-06 DIAGNOSIS — C8590 Non-Hodgkin lymphoma, unspecified, unspecified site: Secondary | ICD-10-CM | POA: Diagnosis not present

## 2015-04-06 DIAGNOSIS — D801 Nonfamilial hypogammaglobulinemia: Secondary | ICD-10-CM | POA: Diagnosis not present

## 2015-04-06 DIAGNOSIS — C9000 Multiple myeloma not having achieved remission: Secondary | ICD-10-CM

## 2015-04-06 MED ORDER — IMMUNE GLOBULIN (HUMAN) 10 GM/100ML IV SOLN
30.0000 g | Freq: Once | INTRAVENOUS | Status: AC
  Administered 2015-04-06: 30 g via INTRAVENOUS
  Filled 2015-04-06: qty 300

## 2015-04-06 MED ORDER — ACETAMINOPHEN 325 MG PO TABS
650.0000 mg | ORAL_TABLET | Freq: Four times a day (QID) | ORAL | Status: DC | PRN
  Administered 2015-04-06: 650 mg via ORAL
  Filled 2015-04-06: qty 2

## 2015-04-06 MED ORDER — SODIUM CHLORIDE 0.9 % IJ SOLN: 10.0000 mL | INTRAMUSCULAR | Status: DC | PRN

## 2015-04-06 MED ORDER — SODIUM CHLORIDE 0.9 % IV SOLN
Freq: Once | INTRAVENOUS | Status: AC
  Administered 2015-04-06: 10:00:00 via INTRAVENOUS

## 2015-04-06 MED ORDER — HEPARIN SOD (PORK) LOCK FLUSH 100 UNIT/ML IV SOLN
500.0000 [IU] | Freq: Once | INTRAVENOUS | Status: DC | PRN
  Filled 2015-04-06: qty 5

## 2015-04-06 NOTE — Patient Instructions (Signed)
[facility] Discharge Instructions for Patients Receiving Chemotherapy  Today you received the following chemotherapy agents IVIG Please follow up as scheduled Call the clinic if you have any questions or concerns  To help prevent nausea and vomiting after your treatment, we encourage you to take your nausea medication .   If you develop nausea and vomiting, or diarrhea that is not controlled by your medication, call the clinic.  The clinic phone number is [phone]. Office hours are Monday-Friday 8:30am-5:00pm.  BELOW ARE SYMPTOMS THAT SHOULD BE REPORTED IMMEDIATELY:  *FEVER GREATER THAN 101.0 F  *CHILLS WITH OR WITHOUT FEVER  NAUSEA AND VOMITING THAT IS NOT CONTROLLED WITH YOUR NAUSEA MEDICATION  *UNUSUAL SHORTNESS OF BREATH  *UNUSUAL BRUISING OR BLEEDING  TENDERNESS IN MOUTH AND THROAT WITH OR WITHOUT PRESENCE OF ULCERS  *URINARY PROBLEMS  *BOWEL PROBLEMS  UNUSUAL RASH Items with * indicate a potential emergency and should be followed up as soon as possible. If you have an emergency after office hours please contact your primary care physician or go to the nearest emergency department.  Please call the clinic during office hours if you have any questions or concerns.   You may also contact the Patient Navigator at [phone] should you have any questions or need assistance in obtaining follow up care. _____________________________________________________________________ Have you asked about our STAR program?    STAR stands for Survivorship Training and Rehabilitation, and this is a nationally recognized cancer care program that focuses on survivorship and rehabilitation.  Cancer and cancer treatments may cause problems, such as, pain, making you feel tired and keeping you from doing the things that you need or want to do. Cancer rehabilitation can help. Our goal is to reduce these troubling effects and help you have the best quality of life  possible.  You may receive a survey from a nurse that asks questions about your current state of health.  Based on the survey results, all eligible patients will be referred to the [facility] program for an evaluation so we can better serve you! A frequently asked questions sheet is available upon request.

## 2015-04-06 NOTE — Progress Notes (Signed)
Jeremy Parrish Tolerated chemotherapy well today Discharged ambulatory

## 2015-04-06 NOTE — Patient Instructions (Addendum)
[place] at [facility] Discharge Instructions  RECOMMENDATIONS MADE BY THE CONSULTANT AND ANY TEST RESULTS WILL BE SENT TO YOUR REFERRING PHYSICIAN.  Exam and discussion by Dr. Whitney Muse No changes with therapy. Call with any issues or concerns.  Follow-up in 4 weeks with labs and office visit.  Thank you for choosing [place] at [facility] to provide your oncology and hematology care.  To afford each patient quality time with our provider, please arrive at least 15 minutes before your scheduled appointment time.    You need to re-schedule your appointment should you arrive 10 or more minutes late.  We strive to give you quality time with our providers, and arriving late affects you and other patients whose appointments are after yours.  Also, if you no show three or more times for appointments you may be dismissed from the clinic at the providers discretion.     Again, thank you for choosing [facility].  Our hope is that these requests will decrease the amount of time that you wait before being seen by our physicians.       _____________________________________________________________  Should you have questions after your visit to [facility], please contact our office at [phone] between the hours of 8:30 a.m. and 4:30 p.m.  Voicemails left after 4:30 p.m. will not be returned until the following business day.  For prescription refill requests, have your pharmacy contact our office.

## 2015-04-06 NOTE — Progress Notes (Signed)
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 Marcell Anger. 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 In 2011 he was diagnosed with IgG lambda multiple myeloma and treated with 4 cycles of Velcade and dexamethasone followed by autologous peripheral blood stem cell transplant and currently remains on maintenance Revlimid and also monthly intravenous IgG.  Zometa 4 mg intravenously monthly was discontinued with last treatment on 08/18/2014.   DIAGNOSIS: IgG lambda Myeloma   CURRENT THERAPY: IVIG  Revlimid 10 mg 7 on/7 off  INTERVAL HISTORY: Jeremy Parrish 74 y.o. 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  Hematologically, he denies any complaints and ROS questioning is negative. //  He says his right thigh feels like it's bruised though there is no color change or rash. He fell about a month ago, when he passed out in his chair and he landed on his left side. He woke up when he fell, he believes it may have been blood pressure related because he was trying to stand up at the time. He denies any other syncopal episodes. He did not see his PCP nor go to the ED after this episode. He denies CP, SOB.   He is having no problems with his Revlimid.  Past Medical History  Diagnosis Date  . Hypertension   . Heart disease   . Kidney stones     history  . Lung mass   . Heart murmur   .  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 Coronary artery disease   . Depression   . Shortness of breath   . Peripheral arterial disease   . Bladder neck contracture   . Personal history of other diseases of circulatory system   . Aortic aneurysm of unspecified site without mention of rupture   . Arthritis   . Intestinovesical fistula   . Esophageal reflux   . Hyperlipidemia   . Anemia   . CHF (congestive heart failure)   . COPD (chronic obstructive pulmonary disease)   . Cerebral atherosclerosis     Carotid Doppler, 02/16/2013 - Bilateral Proximal ICAs,demonstrate mild plaque w/o evidence of significant diameter reduction, dissection, or any other vascular abnormality  . Complication of anesthesia   . PONV (postoperative nausea and vomiting)   . Stroke 2013    Speech.  . Hx of bladder cancer 10/07/2012  . Sleep apnea     05-02-14 cpap , not yet used- suggested settings 5  . Cancer   . Prostate cancer 2000  . Multiple myeloma   . Non Hodgkin's lymphoma   . 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;  H/O cardiac pacemaker, Medtronic REVO, MRI conditional device, placed 07/2011 for sympyomatic bradycardia; Hx of bladder cancer; Hyperlipidemia with target LDL less than 100; Prediabetes; Expressive aphasia; Hemiplegia affecting right dominant side; Aneurysm of iliac artery; Shortness of breath; Rotator cuff tear arthropathy of right shoulder; Left rotator cuff tear arthropathy; Muscle weakness (generalized); Status post arthroscopy of shoulder; Pain  in joint, shoulder region; Fever; Pneumonia; Pancytopenia; Chest pain with moderate risk for cardiac etiology; Inguinal hernia; Obstructive sleep apnea; Central sleep apnea; Hypokalemia; URI (upper respiratory infection); and Weakness on his problem list.     is allergic to diphenhydramine hcl; morphine and related; and tape.  Jeremy Parrish had no medications administered during this visit.  Past Surgical History  Procedure Laterality Date  . Prostate surgery    . Heart stents x 5  1999  . Portacath placement  07/26/2009    right chest  . Wrist surgery      right  . Left ear skin cancer removed    . Bone marrow transplant  2011  . Pacemaker insertion  07/22/2011    Medtronic  . Coronary angioplasty  06/24/2000    PCI and stenting in mid & proximal RCA  . Insert / replace / remove pacemaker    . Tee without cardioversion  10/13/2012    Procedure: TRANSESOPHAGEAL ECHOCARDIOGRAM (TEE);  Surgeon: Thurmon Fair, MD;  Location: [facility] ENDOSCOPY;  Service: Cardiovascular;  Laterality: N/A;  pat/kay/echo notified  . Colonoscopy N/A 01/01/2013    Procedure: COLONOSCOPY;  Surgeon: Malissa Hippo, MD;  Location: [facility] ENDO SUITE;  Service: Endoscopy;  Laterality: N/A;  825-moved to 940 Ann notified pt  . Rotator    . Rotator cuff repair Right   . Colon surgery      colon resection  . Bladder surgery    . Shoulder arthroscopy with subacromial decompression Right 07/21/2013    Procedure: RIGHT SHOULDER ARTHROSCOPY WITH SUBACROMIAL DECOMPRESSION AND DEBRIDEMENT & Injection of Left Shoulder;  Surgeon: Harvie Junior, MD;  Location: [facility] OR;  Service: Orthopedics;  Laterality: Right;  . US echocardiography  06/19/2011    RV mildly dilated,mild to mod. MR,mild AI,mild PI  . Nm myocar perf wall motion  11/27/2007    inferior scar  . Inguinal hernia repair Right 05/04/2014    Procedure: OPEN RIGHT INGUINAL HERNIA REPAIR with mesh;  Surgeon: Mariella Saa, MD;  Location: [facility] ORS;  Service: General;  Laterality:  Right;    Denies any headaches, dizziness, double vision, fevers, chills, night sweats, nausea, vomiting, diarrhea, constipation, chest pain, heart palpitations, shortness of breath, blood in stool, black tarry stool, urinary pain, urinary burning, urinary frequency, hematuria.   PHYSICAL EXAMINATION ECOG PERFORMANCE STATUS: 1 - Symptomatic but completely ambulatory  Filed Vitals:   04/06/15 0956  BP: 114/67  Pulse: 61  Temp:   Resp:     GENERAL:alert, no distress, well nourished, well developed, comfortable, cooperative, smiling and aphasia SKIN: skin color, texture, turgor are normal, no rashes or significant lesions HEAD: Normocephalic, No masses, lesions, tenderness or abnormalities EYES: normal, PERRLA, EOMI, Conjunctiva are pink and non-injected EARS: External ears normal OROPHARYNX:mucous membranes are moist  NECK: supple, no adenopathy, trachea midline LYMPH:  not examined BREAST:not examined LUNGS: clear to auscultation  HEART: regular rate & rhythm ABDOMEN:abdomen soft and normal bowel sounds No rebound, no guarding. No palpable masses BACK: Back symmetric, no curvature. EXTREMITIES:less then 2 second capillary refill, no joint deformities, effusion, or inflammation, no skin discoloration, no clubbing, no cyanosis  NEURO: alert & oriented x 3 , no focal motor/sensory deficits, gait normal   LABORATORY DATA: CBC    Component Value Date/Time   WBC 3.5* 04/03/2015 1012   RBC 4.38 04/03/2015 1012   HGB 12.5* 04/03/2015 1012   HCT 38.7* 04/03/2015 1012   PLT 116* 04/03/2015 1012   MCV 88.4 04/03/2015 1012   MCH 28.5 04/03/2015 1012   MCHC 32.3 04/03/2015 1012   RDW 15.0 04/03/2015 1012   LYMPHSABS 1.5 04/03/2015 1012   MONOABS 0.4 04/03/2015 1012   EOSABS 0.1 04/03/2015 1012   BASOSABS 0.0 04/03/2015 1012      Chemistry      Component Value Date/Time   NA 139 04/03/2015 1012   K 3.8 04/03/2015 1012   CL 105 04/03/2015 1012   CO2 27 04/03/2015 1012   BUN  10 04/03/2015 1012   CREATININE 0.91 04/03/2015 1012   CREATININE 0.80 11/07/2014 1034      Component Value Date/Time   CALCIUM 8.9 04/03/2015 1012   ALKPHOS 67 04/03/2015 1012   AST 23 04/03/2015 1012   ALT 18 04/03/2015 1012   BILITOT 0.7 04/03/2015 1012     Lab Results  Component Value Date   PROT 7.0 04/03/2015   ALBUMINELP 56.9 01/06/2015   A1GS 5.1* 01/06/2015   A2GS 10.4 01/06/2015   BETS 7.2 01/06/2015   BETA2SER 5.5 01/06/2015   GAMS 14.9 01/06/2015   MSPIKE NOT DETECTED 01/06/2015   SPEI (NOTE) 01/06/2015   SPECOM (NOTE) 01/06/2015   IGGSERUM 1009 04/03/2015   IGA 338 04/03/2015   IGMSERUM 23 04/03/2015   IMMELINT (NOTE) 01/06/2015   KPAFRELGTCHN 34.14* 04/03/2015   LAMBDASER 27.05* 04/03/2015   KAPLAMBRATIO 1.26 04/03/2015   Lab Results  Component Value Date   IRON 99 04/20/2014   TIBC 365 04/20/2014   FERRITIN 54 04/20/2014       ASSESSMENT AND PLAN:  IgG lambda myeloma on revlimid.  Marginal zone lymphoma Hypogammaglobulinemia  I reviewed his laboratory studies with him and advised him that things look good. He tolerates his Revlimid without difficulty and therefore we will just continue. He is on IVIG for hypogammaglobulinemia . He tolerates it very well and has had no recent problems with infection. We will continue with monthly therapy.  I advised him if his abdominal discomfort and thigh discomfort do not improve over the next several weeks to let us know. His exam is unremarkable.  I also advised the patient if he has another "syncopal" episodes that he is to go to the emergency department. He really feels it was blood pressure related because he was going from a sitting to standing position. I advised him to be cautious when going from sitting to standing or anything with rapid movement.  All questions were answered. The patient knows to call the clinic with any problems, questions or concerns. We can certainly see the patient much sooner if  necessary.  This document serves as a record of services personally performed by Ancil Linsey, MD. It was created on her behalf by Arlyce Harman, a trained medical scribe. The creation of this record is based on the scribe's personal observations and the provider's statements to them. This document has been checked and approved by the attending provider.  I have reviewed the above documentation for accuracy and completeness, and I agree with the above.    Molli Hazard MD 04/06/2015 2:15 PM

## 2015-04-12 NOTE — Telephone Encounter (Signed)
Pt is still waiting to hear something.

## 2015-04-14 ENCOUNTER — Ambulatory Visit (INDEPENDENT_AMBULATORY_CARE_PROVIDER_SITE_OTHER): Payer: Medicare Other | Admitting: Cardiovascular Disease

## 2015-04-14 ENCOUNTER — Encounter: Payer: Self-pay | Admitting: Cardiovascular Disease

## 2015-04-14 VITALS — BP 102/60 | HR 68 | Ht 67.0 in | Wt 175.4 lb

## 2015-04-14 DIAGNOSIS — I10 Essential (primary) hypertension: Secondary | ICD-10-CM | POA: Diagnosis not present

## 2015-04-14 DIAGNOSIS — E785 Hyperlipidemia, unspecified: Secondary | ICD-10-CM | POA: Diagnosis not present

## 2015-04-14 DIAGNOSIS — G4731 Primary central sleep apnea: Secondary | ICD-10-CM

## 2015-04-14 DIAGNOSIS — I2510 Atherosclerotic heart disease of native coronary artery without angina pectoris: Secondary | ICD-10-CM | POA: Diagnosis not present

## 2015-04-14 NOTE — Progress Notes (Signed)
04/14/2015 Jeremy Parrish   November 10, 1941  [id number]  Primary Physician [name], MD Primary Cardiologist: Lorretta Harp MD [name]   HPI:  The patient is a delightful 74 year old, mildly overweight, married Caucasian male, father of 49, grandfather to 4 grandchildren who I saw 03/02/14.Marland Kitchen He has a history of CAD dating back to 64. I intervened on him multiple times and have put stents in his RCA and circumflex coronary arteries. His last cath performed Apr 10, 2006, revealed patent stents with a 30% proximal LAD lesion and an EF of 45%. He does have chronic dyspnea but denies chest pain. His other problems include hypertension and hyperlipidemia. In addition, he does have bladder cancer as well as lymphoma, myeloma. He has had a partial colectomy. He has also had a bone marrow transplant at [facility] August 24, 2009, and has been cancer-free by PET scan. 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 Sallyanne Kuster with improvement in his symptoms. He was last seen in the office for interrogation January 02, 2012  I saw him one year ago. He complains of increasing dyspnea and occasional chest pain. His last 2-D echo was performed 2 years ago and he had a hypokinetic lateral wall of his left Myoview stress test was performed 11/27/07. He is scheduled for right rotator cuff surgery in the near future. I obtained a Myoview stress test a 2-D echo revealing inferolateral scar unchanged from prior functional studies and moderately reduced LVEF of 35-40%.based on the results of his Myoview I cleared him for his rotator cuff surgeryAt moderately increased risk. He saw Kerin Ransom back in the office in January 2015  complaining of chest pain. Again the Myoview was intermediate risk because of scar with moderate LV dysfunction. He was placed on by mouth Imdur resulting in improvement in his chest pain. Because of symptoms of  obstructive sleep apnea I obtain a sleep study which was abnormal. He now requires C Pap and is followed by Dr. Claiborne Billings.   Current Outpatient Prescriptions  Medication Sig Dispense Refill  . albuterol (PROVENTIL HFA;VENTOLIN HFA) 108 (90 BASE) MCG/ACT inhaler Inhale 2 puffs into the lungs every 6 (six) hours as needed for wheezing or shortness of breath. 1 Inhaler 2  . ALPRAZolam (XANAX) 0.5 MG tablet Take 1-2 tablets (0.5-1 mg total) by mouth at bedtime as needed for anxiety. 90 tablet 1  . amLODipine (NORVASC) 10 MG tablet Take 1 tablet (10 mg total) by mouth every morning. 90 tablet 1  . aspirin EC 81 MG tablet Take 81 mg by mouth at bedtime.    . celecoxib (CELEBREX) 100 MG capsule Take 1 capsule (100 mg total) by mouth 2 (two) times daily. 60 capsule 3  . citalopram (CELEXA) 40 MG tablet Take 1 tablet (40 mg total) by mouth daily. 90 tablet 3  . clopidogrel (PLAVIX) 75 MG tablet TAKE 1 TABLET BY MOUTH EVERY DAY 90 tablet 3  . fexofenadine (ALLEGRA ALLERGY) 180 MG tablet Take 1 tablet (180 mg total) by mouth daily. 30 tablet 5  . fluticasone (FLONASE) 50 MCG/ACT nasal spray Place 2 sprays into both nostrils daily. 16 g 6  . lenalidomide (REVLIMID) 10 MG capsule Take 1 capsule (10 mg total) by mouth daily. Take 1 capsule by mouth daily for 7 days on followed by 7 days off 14 capsule 0  . nitroGLYCERIN (NITROSTAT) 0.4 MG SL tablet Place 1 tablet (0.4 mg total) under the tongue  every 5 (five) minutes as needed for chest pain. 25 tablet 12  . omeprazole (PRILOSEC) 20 MG capsule TAKE 1 CAPSULE (20 MG TOTAL) BY MOUTH DAILY. 90 capsule 1  . ondansetron (ZOFRAN) 4 MG tablet Take 1 tablet (4 mg total) by mouth every 8 (eight) hours as needed for nausea or vomiting. 30 tablet 2  . oxyCODONE-acetaminophen (PERCOCET/ROXICET) 5-325 MG per tablet Take 1-2 tablets by mouth every 8 (eight) hours as needed for severe pain. 90 tablet 0  . potassium chloride 20 MEQ TBCR Take 20 mEq by mouth daily. (Patient taking  differently: Take 20 mEq by mouth 2 (two) times daily. ) 30 tablet 0  . simvastatin (ZOCOR) 20 MG tablet Take 1 tablet (20 mg total) by mouth at bedtime. 90 tablet 2  . traMADol (ULTRAM) 50 MG tablet Take 1 tablet (50 mg total) by mouth every 8 (eight) hours as needed. (Patient taking differently: Take 50 mg by mouth every 8 (eight) hours as needed (pain). ) 60 tablet 2  . zolpidem (AMBIEN CR) 6.25 MG CR tablet Take 1 tablet (6.25 mg total) by mouth at bedtime as needed for sleep. 30 tablet 0   No current facility-administered medications for this visit.   Facility-Administered Medications Ordered in Other Visits  Medication Dose Route Frequency Provider Last Rate Last Dose  . acetaminophen (TYLENOL) tablet 650 mg  650 mg Oral Q6H PRN Baird Cancer, PA-C   650 mg at 12/09/14 0855  . sodium chloride 0.9 % injection 10 mL  10 mL Intracatheter PRN Baird Cancer, PA-C   10 mL at 12/09/14 0856    Allergies  Allergen Reactions  . Diphenhydramine Hcl     REACTION: "hyper"  . Morphine And Related Other (See Comments)    hallucinations  . Tape Rash    Paper tape is ok    History   Social History  . Marital Status: Married    Spouse Name: [name]  . Number of Children: 3  . Years of Education: 9th   Occupational History  . retired     Social History Main Topics  . Smoking status: Former Smoker -- 1.00 packs/day for 20 years    Types: Cigarettes    Quit date: 11/14/1994  . Smokeless tobacco: Never Used  . Alcohol Use: No     Comment: previously drank but none for at least 15 years.  . Drug Use: No  . Sexual Activity: Not on file   Other Topics Concern  . Not on file   Social History Narrative   Patient lives at home spouse.   Caffeine Use: Occasionally     Review of Systems: General: negative for chills, fever, night sweats or weight changes.  Cardiovascular: negative for chest pain, dyspnea on exertion, edema, orthopnea, palpitations, paroxysmal nocturnal dyspnea or  shortness of breath Dermatological: negative for rash Respiratory: negative for cough or wheezing Urologic: negative for hematuria Abdominal: negative for nausea, vomiting, diarrhea, bright red blood per rectum, melena, or hematemesis Neurologic: negative for visual changes, syncope, or dizziness All other systems reviewed and are otherwise negative except as noted above.    Blood pressure 102/60, pulse 68, height 5\' 7"  (1.702 m), weight 175 lb 6.4 oz (79.561 kg).  General appearance: alert and no distress Neck: no adenopathy, no carotid bruit, no JVD, supple, symmetrical, trachea midline and thyroid not enlarged, symmetric, no tenderness/mass/nodules Lungs: clear to auscultation bilaterally Heart: regular rate and rhythm, S1, S2 normal, no murmur, click, rub or gallop Extremities:  extremities normal, atraumatic, no cyanosis or edema  EKG normal sinus rhythm at 66 without ST or T-wave changes. I personally reviewed this EKG  ASSESSMENT AND PLAN:   Central sleep apnea History of sleep apnea on C Pap followed by Dr. Claiborne Billings   Coronary atherosclerosis History of coronary artery disease dating back to 1996. I've intervened on him multiple times between stents in his RCA and circumflex coronary arteries. His last chronic catheterization performed 04/10/06 revealed 8 and stents with an EF of 45%. He had a Myoview stress test performed 12/01/13 revealing moderate scar without ischemia thought to be low risk. He denies chest pain or shortness of breath.   Essential hypertension History of hypertension blood pressure measured at 102/60. He is on amlodipine. Continue current meds at current dosing   Hyperlipidemia with target LDL less than 100 History of hyperlipidemia on some statin 20 mg a day. His most recent lipid profile performed 12/27/14 revealed total cholesterol 104 LDL 49 HDL of 35.       Lorretta Harp MD FACP,FACC,FAHA, [facility] 04/14/2015 11:38 AM

## 2015-04-14 NOTE — Patient Instructions (Signed)
Your physician wants you to follow-up in: 1 year with Dr Berry. You will receive a reminder letter in the mail two months in advance. If you don't receive a letter, please call our office to schedule the follow-up appointment.  

## 2015-04-14 NOTE — Assessment & Plan Note (Signed)
History of hyperlipidemia on some statin 20 mg a day. His most recent lipid profile performed 12/27/14 revealed total cholesterol 104 LDL 49 HDL of 35.

## 2015-04-14 NOTE — Assessment & Plan Note (Signed)
History of hypertension blood pressure measured at 102/60. He is on amlodipine. Continue current meds at current dosing

## 2015-04-14 NOTE — Assessment & Plan Note (Signed)
History of coronary artery disease dating back to 20. I've intervened on him multiple times between stents in his RCA and circumflex coronary arteries. His last chronic catheterization performed 04/10/06 revealed 8 and stents with an EF of 45%. He had a Myoview stress test performed 12/01/13 revealing moderate scar without ischemia thought to be low risk. He denies chest pain or shortness of breath.

## 2015-04-14 NOTE — Assessment & Plan Note (Signed)
History of sleep apnea on C Pap followed by Dr. Claiborne Billings

## 2015-04-14 NOTE — Telephone Encounter (Signed)
Spoke with patient when he was in seeing Dr. Gwenlyn Found. He states that [facility] no longer takes his insurance and requests to be sent to [facility] in [place]. Spoke with Ivin Booty at [facility]. She tells me that they did not win the bid for some of the counties and the patient's county was affected by this. She will contact patient on Monday to see if she can assist him with the transfer of his care.

## 2015-04-19 DIAGNOSIS — L814 Other melanin hyperpigmentation: Secondary | ICD-10-CM | POA: Diagnosis not present

## 2015-04-19 DIAGNOSIS — L570 Actinic keratosis: Secondary | ICD-10-CM | POA: Diagnosis not present

## 2015-04-19 DIAGNOSIS — D1801 Hemangioma of skin and subcutaneous tissue: Secondary | ICD-10-CM | POA: Diagnosis not present

## 2015-04-19 DIAGNOSIS — Z85828 Personal history of other malignant neoplasm of skin: Secondary | ICD-10-CM | POA: Diagnosis not present

## 2015-04-19 DIAGNOSIS — L821 Other seborrheic keratosis: Secondary | ICD-10-CM | POA: Diagnosis not present

## 2015-04-21 ENCOUNTER — Encounter: Payer: Self-pay | Admitting: Cardiovascular Disease

## 2015-04-24 DIAGNOSIS — M1711 Unilateral primary osteoarthritis, right knee: Secondary | ICD-10-CM | POA: Diagnosis not present

## 2015-04-24 DIAGNOSIS — M19012 Primary osteoarthritis, left shoulder: Secondary | ICD-10-CM | POA: Diagnosis not present

## 2015-04-24 DIAGNOSIS — M19011 Primary osteoarthritis, right shoulder: Secondary | ICD-10-CM | POA: Diagnosis not present

## 2015-04-24 DIAGNOSIS — M1712 Unilateral primary osteoarthritis, left knee: Secondary | ICD-10-CM | POA: Diagnosis not present

## 2015-04-25 ENCOUNTER — Other Ambulatory Visit (HOSPITAL_COMMUNITY): Payer: Self-pay | Admitting: Hematology & Oncology

## 2015-04-25 ENCOUNTER — Telehealth (HOSPITAL_COMMUNITY): Payer: Self-pay

## 2015-04-25 DIAGNOSIS — M25551 Pain in right hip: Secondary | ICD-10-CM

## 2015-04-25 NOTE — Telephone Encounter (Signed)
Complains with persistent pain in right thigh, that radiates into right groin and into right hip and lower back.  States "Dr. Whitney Muse said if it didn't get any better to let her know that she may want to get some xrays done. It's no better that it was when I saw her in May."

## 2015-04-25 NOTE — Telephone Encounter (Signed)
Notified that Dr. Whitney Muse has put in orders for xrays of pelvis and hip and that he can go to xray and have done at any time and they will be contacted if there are any concerns after results are received.

## 2015-04-26 ENCOUNTER — Telehealth: Payer: Self-pay | Admitting: Cardiovascular Disease

## 2015-04-26 ENCOUNTER — Telehealth: Payer: Self-pay | Admitting: *Deleted

## 2015-04-26 ENCOUNTER — Ambulatory Visit (HOSPITAL_COMMUNITY)
Admission: RE | Admit: 2015-04-26 | Discharge: 2015-04-26 | Disposition: A | Discharge status: Home / Self Care | Payer: Medicare Other | Source: Ambulatory Visit | Attending: Hematology & Oncology | Admitting: Hematology & Oncology

## 2015-04-26 DIAGNOSIS — M25551 Pain in right hip: Secondary | ICD-10-CM | POA: Diagnosis not present

## 2015-04-26 NOTE — Telephone Encounter (Signed)
Ivin Booty from [facility] called to inform me the patient called them to request for them to pick up his BIPAP machine. Per sharon he states that he is sick and doesn't want to use the machine any longer. They plan to pick up his BIPAP machine tomorrow. Note will be routed to Dr. Claiborne Billings for his review.

## 2015-04-26 NOTE — Telephone Encounter (Signed)
Acknowledged; not recommended, but his perogative

## 2015-04-26 NOTE — Telephone Encounter (Signed)
Please call,pt says to cancel his order for his C Pap.

## 2015-04-26 NOTE — Telephone Encounter (Signed)
Staff message sent to Jeremy Parrish to not order another C-PAP machine

## 2015-05-01 ENCOUNTER — Other Ambulatory Visit (HOSPITAL_COMMUNITY): Payer: Medicare Other

## 2015-05-02 ENCOUNTER — Other Ambulatory Visit (HOSPITAL_COMMUNITY): Payer: Self-pay | Admitting: Oncology

## 2015-05-02 ENCOUNTER — Encounter (HOSPITAL_COMMUNITY): Payer: Medicare Other | Attending: Oncology

## 2015-05-02 DIAGNOSIS — C9000 Multiple myeloma not having achieved remission: Secondary | ICD-10-CM | POA: Insufficient documentation

## 2015-05-02 DIAGNOSIS — C9001 Multiple myeloma in remission: Secondary | ICD-10-CM | POA: Insufficient documentation

## 2015-05-02 DIAGNOSIS — Z8572 Personal history of non-Hodgkin lymphomas: Secondary | ICD-10-CM

## 2015-05-02 LAB — CBC WITH DIFFERENTIAL/PLATELET
Basophils Absolute: 0 10*3/uL (ref 0.0–0.1)
Basophils Relative: 0 % (ref 0–1)
EOS ABS: 0.1 10*3/uL (ref 0.0–0.7)
Eosinophils Relative: 2 % (ref 0–5)
HEMATOCRIT: 40 % (ref 39.0–52.0)
HEMOGLOBIN: 13.2 g/dL (ref 13.0–17.0)
LYMPHS ABS: 1.2 10*3/uL (ref 0.7–4.0)
LYMPHS PCT: 24 % (ref 12–46)
MCH: 29.1 pg (ref 26.0–34.0)
MCHC: 33 g/dL (ref 30.0–36.0)
MCV: 88.1 fL (ref 78.0–100.0)
MONO ABS: 0.7 10*3/uL (ref 0.1–1.0)
MONOS PCT: 14 % — AB (ref 3–12)
NEUTROS ABS: 3.1 10*3/uL (ref 1.7–7.7)
Neutrophils Relative %: 60 % (ref 43–77)
Platelets: 110 10*3/uL — ABNORMAL LOW (ref 150–400)
RBC: 4.54 MIL/uL (ref 4.22–5.81)
RDW: 15.4 % (ref 11.5–15.5)
WBC: 5.2 10*3/uL (ref 4.0–10.5)

## 2015-05-02 LAB — COMPREHENSIVE METABOLIC PANEL
ALK PHOS: 69 U/L (ref 38–126)
ALT: 24 U/L (ref 17–63)
ANION GAP: 7 (ref 5–15)
AST: 23 U/L (ref 15–41)
Albumin: 3.8 g/dL (ref 3.5–5.0)
BUN: 16 mg/dL (ref 6–20)
CO2: 25 mmol/L (ref 22–32)
Calcium: 8.9 mg/dL (ref 8.9–10.3)
Chloride: 105 mmol/L (ref 101–111)
Creatinine, Ser: 0.89 mg/dL (ref 0.61–1.24)
GFR calc non Af Amer: >60 mL/min (ref >60)
GLUCOSE: 95 mg/dL (ref 65–99)
Potassium: 3.7 mmol/L (ref 3.5–5.1)
SODIUM: 137 mmol/L (ref 135–145)
TOTAL PROTEIN: 6.9 g/dL (ref 6.5–8.1)
Total Bilirubin: 0.8 mg/dL (ref 0.3–1.2)

## 2015-05-02 LAB — C-REACTIVE PROTEIN: CRP: <0.5 mg/dL (ref <1.0)

## 2015-05-02 MED ORDER — LENALIDOMIDE 10 MG PO CAPS: 10.0000 mg | ORAL_CAPSULE | Freq: Every day | ORAL | Status: DC

## 2015-05-02 NOTE — Progress Notes (Signed)
LABS DRAWN

## 2015-05-03 ENCOUNTER — Telehealth: Payer: Self-pay | Admitting: *Deleted

## 2015-05-03 LAB — KAPPA/LAMBDA LIGHT CHAINS
KAPPA FREE LGHT CHN: 20.37 mg/L — AB (ref 3.30–19.40)
KAPPA, LAMDA LIGHT CHAIN RATIO: 1.12 (ref 0.26–1.65)
LAMDA FREE LIGHT CHAINS: 18.26 mg/L (ref 5.71–26.30)

## 2015-05-03 LAB — BETA 2 MICROGLOBULIN, SERUM: BETA 2 MICROGLOBULIN: 1.5 mg/L (ref 0.6–2.4)

## 2015-05-03 NOTE — Telephone Encounter (Signed)
[facility] orders faxed

## 2015-05-04 ENCOUNTER — Encounter (HOSPITAL_COMMUNITY): Payer: Self-pay | Admitting: Oncology

## 2015-05-04 ENCOUNTER — Encounter (HOSPITAL_BASED_OUTPATIENT_CLINIC_OR_DEPARTMENT_OTHER): Payer: Medicare Other

## 2015-05-04 ENCOUNTER — Encounter (HOSPITAL_BASED_OUTPATIENT_CLINIC_OR_DEPARTMENT_OTHER): Payer: Medicare Other | Admitting: Oncology

## 2015-05-04 VITALS — BP 126/69 | HR 62 | Temp 97.6°F | Resp 16

## 2015-05-04 VITALS — BP 134/65 | HR 68 | Temp 98.2°F | Resp 20 | Wt 173.6 lb

## 2015-05-04 DIAGNOSIS — Z8572 Personal history of non-Hodgkin lymphomas: Secondary | ICD-10-CM | POA: Diagnosis not present

## 2015-05-04 DIAGNOSIS — D801 Nonfamilial hypogammaglobulinemia: Secondary | ICD-10-CM

## 2015-05-04 DIAGNOSIS — C9001 Multiple myeloma in remission: Secondary | ICD-10-CM

## 2015-05-04 LAB — MULTIPLE MYELOMA PANEL, SERUM
ALBUMIN/GLOB SERPL: 1.6 (ref 0.7–1.7)
Albumin SerPl Elph-Mcnc: 3.8 g/dL (ref 2.9–4.4)
Alpha 1: 0.2 g/dL (ref 0.0–0.4)
Alpha2 Glob SerPl Elph-Mcnc: 0.6 g/dL (ref 0.4–1.0)
B-Globulin SerPl Elph-Mcnc: 0.9 g/dL (ref 0.7–1.3)
Gamma Glob SerPl Elph-Mcnc: 0.8 g/dL (ref 0.4–1.8)
Globulin, Total: 2.4 g/dL (ref 2.2–3.9)
IGG (IMMUNOGLOBIN G), SERUM: 909 mg/dL (ref 700–1600)
IgA: 266 mg/dL (ref 61–437)
IgM, Serum: 24 mg/dL (ref 15–143)
Total Protein ELP: 6.2 g/dL (ref 6.0–8.5)

## 2015-05-04 MED ORDER — IMMUNE GLOBULIN (HUMAN) 10 GM/100ML IV SOLN
30.0000 g | Freq: Once | INTRAVENOUS | Status: AC
  Administered 2015-05-04: 30 g via INTRAVENOUS
  Filled 2015-05-04: qty 300

## 2015-05-04 MED ORDER — ACETAMINOPHEN 325 MG PO TABS
650.0000 mg | ORAL_TABLET | Freq: Four times a day (QID) | ORAL | Status: DC | PRN
  Administered 2015-05-04: 650 mg via ORAL

## 2015-05-04 MED ORDER — ACETAMINOPHEN 325 MG PO TABS
ORAL_TABLET | ORAL | Status: AC
  Filled 2015-05-04: qty 2

## 2015-05-04 MED ORDER — HEPARIN SOD (PORK) LOCK FLUSH 100 UNIT/ML IV SOLN
500.0000 [IU] | Freq: Once | INTRAVENOUS | Status: AC | PRN
  Administered 2015-05-04: 500 [IU]
  Filled 2015-05-04: qty 5

## 2015-05-04 MED ORDER — SODIUM CHLORIDE 0.9 % IJ SOLN
10.0000 mL | INTRAMUSCULAR | Status: DC | PRN
  Administered 2015-05-04: 10 mL
  Filled 2015-05-04: qty 10

## 2015-05-04 MED ORDER — SODIUM CHLORIDE 0.9 % IV SOLN
Freq: Once | INTRAVENOUS | Status: AC
  Administered 2015-05-04: 10:00:00 via INTRAVENOUS

## 2015-05-04 NOTE — Assessment & Plan Note (Signed)
Receiving IVIG every 4 weeks without any recent documented infections.  Tolerating well.

## 2015-05-04 NOTE — Progress Notes (Signed)
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 Marcell Anger. 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 In 2011 he was diagnosed with IgG lambda multiple myeloma and treated with 4 cycles of Velcade and dexamethasone followed by autologous peripheral blood stem cell transplant and currently remains on maintenance Revlimid and also monthly intravenous IgG.  Zometa 4 mg intravenously monthly was discontinued with last treatment on 08/18/2014.  INTERVAL HISTORY: Jeremy Parrish 74 y.o. 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  I personally reviewed and went over laboratory results with the patient.  The results are noted within this dictation.  He denies any recent infections.    He reports a lateral right chest pain that really is located on anterior medial shoulder.  He reports 3 episodes in 1 year.  He denies any correlation with activity.  He has taken Nitro, without benefit during these spells.  He thinks it is an old Rx.  The chest pain lasts 20-30 minutes.  Resolves with rest. Seen by cardiology recently with negative EKG.  He denies any dyspenia, SOB, diaphoresis, or other symptoms associated with the pain.  He asks for a refill on Voltaren Gel.   Past Medical History  Diagnosis Date  . Hypertension   . Heart disease   . Kidney stones     history   . Lung mass   . Heart murmur   . 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 Coronary artery disease   . Depression   . Shortness of breath   . Peripheral arterial disease   . Bladder neck contracture   . Personal history of other diseases of circulatory system   . Aortic aneurysm of unspecified site without mention of rupture   . Arthritis   . Intestinovesical fistula   . Esophageal reflux   . Hyperlipidemia   . Anemia   . CHF (congestive heart failure)   . COPD (chronic obstructive pulmonary disease)   . Cerebral atherosclerosis     Carotid Doppler, 02/16/2013 - Bilateral Proximal ICAs,demonstrate mild plaque w/o evidence of significant diameter reduction, dissection, or any other vascular abnormality  . Complication of anesthesia   . PONV (postoperative nausea and vomiting)   . Stroke 2013    Speech.  . Hx of bladder cancer 10/07/2012  . Sleep apnea     05-02-14 cpap , not yet used- suggested settings 5  . Cancer   . Prostate cancer 2000  . Multiple myeloma   . Non Hodgkin's lymphoma   . 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; Hx of bladder cancer; Hyperlipidemia with target LDL less than 100; Prediabetes; Expressive aphasia; Hemiplegia affecting right dominant side; Aneurysm of iliac artery; Shortness of breath; Rotator cuff tear arthropathy of right shoulder; Left rotator cuff tear arthropathy; Muscle weakness (generalized);  Status post arthroscopy of shoulder; Pain in joint, shoulder region; Fever; Pneumonia; Pancytopenia; Chest pain with moderate risk for cardiac etiology; Inguinal hernia; Obstructive sleep apnea; Central sleep apnea; Hypokalemia; URI (upper respiratory infection); and Weakness on his problem list.     is allergic to diphenhydramine hcl; morphine and related; and tape.  Jeremy Parrish does not currently have medications on file.  Past Surgical History  Procedure Laterality Date  . Prostate surgery    . Heart stents x 5  1999  . Portacath placement  07/26/2009    right chest  . Wrist surgery      right  . Left ear skin cancer removed    . Bone marrow transplant  2011  . Pacemaker insertion  07/22/2011    Medtronic  . Coronary angioplasty  06/24/2000    PCI and stenting in mid & proximal RCA  . Insert / replace / remove pacemaker    . Tee without cardioversion  10/13/2012    Procedure: TRANSESOPHAGEAL ECHOCARDIOGRAM (TEE);  Surgeon: Sanda Klein, MD;  Location: [facility] ENDOSCOPY;  Service: Cardiovascular;  Laterality: N/A;  pat/kay/echo notified  . Colonoscopy N/A 01/01/2013    Procedure: COLONOSCOPY;  Surgeon: Rogene Houston, MD;  Location: [facility] ENDO SUITE;  Service: Endoscopy;  Laterality: N/A;  825-moved to [place] notified pt  . Rotator    . Rotator cuff repair Right   . Colon surgery      colon resection  . Bladder surgery    . Shoulder arthroscopy with subacromial decompression Right 07/21/2013    Procedure: RIGHT SHOULDER ARTHROSCOPY WITH SUBACROMIAL DECOMPRESSION AND DEBRIDEMENT & Injection of Left Shoulder;  Surgeon: Alta Corning, MD;  Location: [place];  Service: Orthopedics;  Laterality: Right;  . US echocardiography  06/19/2011    RV mildly dilated,mild to mod. MR,mild AI,mild PI  . Nm myocar perf wall motion  11/27/2007    inferior scar  . Inguinal hernia repair Right 05/04/2014    Procedure: OPEN RIGHT INGUINAL HERNIA REPAIR with mesh;  Surgeon: Edward Jolly, MD;  Location: [facility] ORS;   Service: General;  Laterality: Right;    Denies any headaches, dizziness, double vision, fevers, chills, night sweats, nausea, vomiting, diarrhea, constipation, chest pain, heart palpitations, shortness of breath, blood in stool, black tarry stool, urinary pain, urinary burning, urinary frequency, hematuria.   PHYSICAL EXAMINATION  ECOG PERFORMANCE STATUS: 1 - Symptomatic but completely ambulatory  Filed Vitals:   05/04/15 0900  BP: 134/65  Pulse: 68  Temp: 98.2 F (36.8 C)  Resp: 20    GENERAL:alert, no distress, well nourished, well developed, comfortable, cooperative, smiling and aphasia SKIN: skin color, texture, turgor are normal, no rashes or significant lesions HEAD: Normocephalic, No masses, lesions, tenderness or abnormalities EYES: normal, PERRLA, EOMI, Conjunctiva are pink and non-injected EARS: External ears normal OROPHARYNX:mucous membranes are moist  NECK: supple, no adenopathy, trachea midline LYMPH:  No adenopathy noted. BREAST:not examined LUNGS: clear to auscultation  HEART: regular rate & rhythm ABDOMEN:abdomen soft and normal bowel sounds BACK: Back symmetric, no curvature. EXTREMITIES:less then 2 second capillary refill, no joint deformities, effusion, or inflammation, no skin discoloration, no clubbing, no  cyanosis  NEURO: alert & oriented x 3 with fluent speech, no focal motor/sensory deficits, gait normal   LABORATORY DATA: CBC    Component Value Date/Time   WBC 5.2 05/02/2015 0940   RBC 4.54 05/02/2015 0940   HGB 13.2 05/02/2015 0940   HCT 40.0 05/02/2015 0940   PLT 110* 05/02/2015 0940   MCV 88.1 05/02/2015 0940   MCH 29.1 05/02/2015 0940   MCHC 33.0 05/02/2015 0940   RDW 15.4 05/02/2015 0940   LYMPHSABS 1.2 05/02/2015 0940   MONOABS 0.7 05/02/2015 0940   EOSABS 0.1 05/02/2015 0940   BASOSABS 0.0 05/02/2015 0940      Chemistry      Component Value Date/Time   NA 137 05/02/2015 0940   K 3.7 05/02/2015 0940   CL 105 05/02/2015 0940     CO2 25 05/02/2015 0940   BUN 16 05/02/2015 0940   CREATININE 0.89 05/02/2015 0940   CREATININE 0.80 11/07/2014 1034      Component Value Date/Time   CALCIUM 8.9 05/02/2015 0940   ALKPHOS 69 05/02/2015 0940   AST 23 05/02/2015 0940   ALT 24 05/02/2015 0940   BILITOT 0.8 05/02/2015 0940     Lab Results  Component Value Date   PROT 6.9 05/02/2015   ALBUMINELP 56.9 01/06/2015   A1GS 5.1* 01/06/2015   A2GS 10.4 01/06/2015   BETS 7.2 01/06/2015   BETA2SER 5.5 01/06/2015   GAMS 14.9 01/06/2015   MSPIKE NOT DETECTED 01/06/2015   SPEI (NOTE) 01/06/2015   SPECOM (NOTE) 01/06/2015   IGGSERUM 1009 04/03/2015   IGA 338 04/03/2015   IGMSERUM 23 04/03/2015   IMMELINT (NOTE) 01/06/2015   KPAFRELGTCHN 20.37* 05/02/2015   LAMBDASER 18.26 05/02/2015   KAPLAMBRATIO 1.12 05/02/2015    Lab Results  Component Value Date   IRON 99 04/20/2014   TIBC 365 04/20/2014   FERRITIN 54 04/20/2014     RADIOGRAPHIC STUDIES:  Dg Hip Unilat With Pelvis 2-3 Views Right  04/26/2015   CLINICAL DATA:  Right hip pain  EXAM: RIGHT HIP (WITH PELVIS) 2-3 VIEWS  COMPARISON:  None.  FINDINGS: There is no evidence of hip fracture or dislocation. There is no evidence of arthropathy or other focal bone abnormality. Surgical clips in the pelvis bilaterally. Mild atherosclerotic calcification.  IMPRESSION: Negative.   Electronically Signed   By: Franchot Gallo M.D.   On: 04/26/2015 09:42      ASSESSMENT AND PLAN:  Multiple myeloma in remission Pleasant 74 year old male with a history of IgG lambda multiple myeloma status post stem cell transplant in October 2011. He has been on maintenance Revlimid 10 mg for 7 days on and 7 days off without any intolerances to date.  Continue maintenance Revlimid.  Labs in 8 weeks: CBC diff, CMET, CRP, B2M, MM panel.  Return in 8 weeks for follow-up.  Will defer Voltaren gel to primary care provider because it is not on our medication list.  I have asked him to restart  his ASA daily since he is on Revlimid.  He is agreeable to this.    His chest pain is atypical.  I have asked him to follow-up his primary care provider and/or his cardiologist regarding this.  He declines an EKG today, "I just had one a month ago with my cardiologist."  He knows to report to ED if needed.  Hypogammaglobulinemia Receiving IVIG every 4 weeks without any recent documented infections.  Tolerating well.   Hx of lymphoma NED   THERAPY PLAN:  Continue  with current therapy.  All questions were answered. The patient knows to call the clinic with any problems, questions or concerns. We can certainly see the patient much sooner if necessary.  Patient and plan discussed with Dr. Ancil Linsey and she is in agreement with the aforementioned.   This note is electronically signed by: Robynn Pane 05/04/2015 10:42 AM

## 2015-05-04 NOTE — Progress Notes (Signed)
Tolerated IVIG infusion without incident.

## 2015-05-04 NOTE — Assessment & Plan Note (Signed)
NED

## 2015-05-04 NOTE — Patient Instructions (Signed)
[place] at [facility] Discharge Instructions  RECOMMENDATIONS MADE BY THE CONSULTANT AND ANY TEST RESULTS WILL BE SENT TO YOUR REFERRING PHYSICIAN.  Exam completed by Kirby Crigler today IVIG today as planned. IVIG in 4 and 8 weeks.   Return in 8 weeks to see the doctor. Restart taking your aspirin. See your PCP about getting Voltaren gel.   Please call the clinic if you have any questions or concerns.  Thank you for choosing [place] at [facility] to provide your oncology and hematology care.  To afford each patient quality time with our provider, please arrive at least 15 minutes before your scheduled appointment time.    You need to re-schedule your appointment should you arrive 10 or more minutes late.  We strive to give you quality time with our providers, and arriving late affects you and other patients whose appointments are after yours.  Also, if you no show three or more times for appointments you may be dismissed from the clinic at the providers discretion.     Again, thank you for choosing [facility].  Our hope is that these requests will decrease the amount of time that you wait before being seen by our physicians.       _____________________________________________________________  Should you have questions after your visit to [facility], please contact our office at [phone] between the hours of 8:30 a.m. and 4:30 p.m.  Voicemails left after 4:30 p.m. will not be returned until the following business day.  For prescription refill requests, have your pharmacy contact our office.

## 2015-05-04 NOTE — Assessment & Plan Note (Addendum)
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 He has been on maintenance Revlimid 10 mg for 7 days on and 7 days off without any intolerances to date.  Continue maintenance Revlimid.  Labs in 8 weeks: CBC diff, CMET, CRP, B2M, MM panel.  Return in 8 weeks for follow-up.  Will defer Voltaren gel to primary care provider because it is not on our medication list.  I have asked him to restart his ASA daily since he is on Revlimid.  He is agreeable to this.    His chest pain is atypical.  I have asked him to follow-up his primary care provider and/or his cardiologist regarding this.  He declines an EKG today, "I just had one a month ago with my cardiologist."  He knows to report to ED if needed.

## 2015-05-04 NOTE — Patient Instructions (Signed)
[place] at [facility] Discharge Instructions  RECOMMENDATIONS MADE BY THE CONSULTANT AND ANY TEST RESULTS WILL BE SENT TO YOUR REFERRING PHYSICIAN.  Today you received IVIG infusion as ordered. Return as scheduled.  Thank you for choosing [place] at [facility] to provide your oncology and hematology care.  To afford each patient quality time with our provider, please arrive at least 15 minutes before your scheduled appointment time.    You need to re-schedule your appointment should you arrive 10 or more minutes late.  We strive to give you quality time with our providers, and arriving late affects you and other patients whose appointments are after yours.  Also, if you no show three or more times for appointments you may be dismissed from the clinic at the providers discretion.     Again, thank you for choosing [facility].  Our hope is that these requests will decrease the amount of time that you wait before being seen by our physicians.       _____________________________________________________________  Should you have questions after your visit to [facility], please contact our office at [phone] between the hours of 8:30 a.m. and 4:30 p.m.  Voicemails left after 4:30 p.m. will not be returned until the following business day.  For prescription refill requests, have your pharmacy contact our office.

## 2015-05-22 DIAGNOSIS — M1711 Unilateral primary osteoarthritis, right knee: Secondary | ICD-10-CM | POA: Diagnosis not present

## 2015-05-22 DIAGNOSIS — M19012 Primary osteoarthritis, left shoulder: Secondary | ICD-10-CM | POA: Diagnosis not present

## 2015-05-22 DIAGNOSIS — M1712 Unilateral primary osteoarthritis, left knee: Secondary | ICD-10-CM | POA: Diagnosis not present

## 2015-05-22 DIAGNOSIS — M19011 Primary osteoarthritis, right shoulder: Secondary | ICD-10-CM | POA: Diagnosis not present

## 2015-05-22 DIAGNOSIS — M67912 Unspecified disorder of synovium and tendon, left shoulder: Secondary | ICD-10-CM | POA: Diagnosis not present

## 2015-05-30 ENCOUNTER — Other Ambulatory Visit: Payer: Self-pay | Admitting: Family Medicine

## 2015-05-31 ENCOUNTER — Other Ambulatory Visit (HOSPITAL_COMMUNITY): Payer: Self-pay | Admitting: Oncology

## 2015-05-31 DIAGNOSIS — C9000 Multiple myeloma not having achieved remission: Secondary | ICD-10-CM

## 2015-05-31 MED ORDER — LENALIDOMIDE 10 MG PO CAPS: 10.0000 mg | ORAL_CAPSULE | Freq: Every day | ORAL | Status: DC

## 2015-06-01 ENCOUNTER — Encounter (HOSPITAL_COMMUNITY): Payer: Medicare Other

## 2015-06-05 ENCOUNTER — Other Ambulatory Visit: Payer: Self-pay | Admitting: *Deleted

## 2015-06-05 DIAGNOSIS — M170 Bilateral primary osteoarthritis of knee: Secondary | ICD-10-CM | POA: Diagnosis not present

## 2015-06-05 DIAGNOSIS — E785 Hyperlipidemia, unspecified: Secondary | ICD-10-CM

## 2015-06-05 MED ORDER — SIMVASTATIN 20 MG PO TABS: 20.0000 mg | ORAL_TABLET | Freq: Every day | ORAL | Status: DC

## 2015-06-05 NOTE — Telephone Encounter (Signed)
Rx(s) sent to pharmacy electronically.  

## 2015-06-06 ENCOUNTER — Encounter (HOSPITAL_COMMUNITY): Payer: Self-pay

## 2015-06-06 ENCOUNTER — Encounter (HOSPITAL_COMMUNITY): Payer: Medicare Other | Attending: Oncology

## 2015-06-06 VITALS — BP 130/64 | HR 60 | Temp 97.9°F | Resp 16 | Wt 177.0 lb

## 2015-06-06 DIAGNOSIS — Z8572 Personal history of non-Hodgkin lymphomas: Secondary | ICD-10-CM

## 2015-06-06 DIAGNOSIS — C9000 Multiple myeloma not having achieved remission: Secondary | ICD-10-CM | POA: Insufficient documentation

## 2015-06-06 DIAGNOSIS — C9001 Multiple myeloma in remission: Secondary | ICD-10-CM | POA: Diagnosis not present

## 2015-06-06 DIAGNOSIS — D801 Nonfamilial hypogammaglobulinemia: Secondary | ICD-10-CM | POA: Diagnosis present

## 2015-06-06 LAB — COMPREHENSIVE METABOLIC PANEL
ALBUMIN: 3.4 g/dL — AB (ref 3.5–5.0)
ALT: 19 U/L (ref 17–63)
AST: 21 U/L (ref 15–41)
Alkaline Phosphatase: 54 U/L (ref 38–126)
Anion gap: 8 (ref 5–15)
BILIRUBIN TOTAL: 0.6 mg/dL (ref 0.3–1.2)
BUN: 10 mg/dL (ref 6–20)
CALCIUM: 8.7 mg/dL — AB (ref 8.9–10.3)
CO2: 25 mmol/L (ref 22–32)
Chloride: 106 mmol/L (ref 101–111)
Creatinine, Ser: 0.93 mg/dL (ref 0.61–1.24)
GFR calc non Af Amer: >60 mL/min (ref >60)
GLUCOSE: 107 mg/dL — AB (ref 65–99)
POTASSIUM: 3.7 mmol/L (ref 3.5–5.1)
Sodium: 139 mmol/L (ref 135–145)
TOTAL PROTEIN: 6 g/dL — AB (ref 6.5–8.1)

## 2015-06-06 LAB — CBC WITH DIFFERENTIAL/PLATELET
Basophils Absolute: 0 10*3/uL (ref 0.0–0.1)
Basophils Relative: 1 % (ref 0–1)
EOS ABS: 0.2 10*3/uL (ref 0.0–0.7)
EOS PCT: 5 % (ref 0–5)
HCT: 34.6 % — ABNORMAL LOW (ref 39.0–52.0)
Hemoglobin: 11.4 g/dL — ABNORMAL LOW (ref 13.0–17.0)
Lymphocytes Relative: 29 % (ref 12–46)
Lymphs Abs: 0.8 10*3/uL (ref 0.7–4.0)
MCH: 29.6 pg (ref 26.0–34.0)
MCHC: 32.9 g/dL (ref 30.0–36.0)
MCV: 89.9 fL (ref 78.0–100.0)
MONOS PCT: 14 % — AB (ref 3–12)
Monocytes Absolute: 0.4 10*3/uL (ref 0.1–1.0)
Neutro Abs: 1.4 10*3/uL — ABNORMAL LOW (ref 1.7–7.7)
Neutrophils Relative %: 51 % (ref 43–77)
Platelets: 95 10*3/uL — ABNORMAL LOW (ref 150–400)
RBC: 3.85 MIL/uL — AB (ref 4.22–5.81)
RDW: 16 % — ABNORMAL HIGH (ref 11.5–15.5)
WBC: 2.8 10*3/uL — ABNORMAL LOW (ref 4.0–10.5)

## 2015-06-06 LAB — C-REACTIVE PROTEIN

## 2015-06-06 MED ORDER — SODIUM CHLORIDE 0.9 % IJ SOLN
10.0000 mL | INTRAMUSCULAR | Status: DC | PRN
  Administered 2015-06-06: 10 mL
  Filled 2015-06-06: qty 10

## 2015-06-06 MED ORDER — HEPARIN SOD (PORK) LOCK FLUSH 100 UNIT/ML IV SOLN
500.0000 [IU] | Freq: Once | INTRAVENOUS | Status: AC | PRN
  Administered 2015-06-06: 500 [IU]
  Filled 2015-06-06: qty 5

## 2015-06-06 MED ORDER — ACETAMINOPHEN 325 MG PO TABS
650.0000 mg | ORAL_TABLET | Freq: Four times a day (QID) | ORAL | Status: DC | PRN
  Administered 2015-06-06: 650 mg via ORAL
  Filled 2015-06-06: qty 2

## 2015-06-06 MED ORDER — IMMUNE GLOBULIN (HUMAN) 10 GM/100ML IV SOLN
30.0000 g | Freq: Once | INTRAVENOUS | Status: AC
  Administered 2015-06-06: 30 g via INTRAVENOUS
  Filled 2015-06-06: qty 300

## 2015-06-06 MED ORDER — SODIUM CHLORIDE 0.9 % IV SOLN
Freq: Once | INTRAVENOUS | Status: AC
  Administered 2015-06-06: 10:00:00 via INTRAVENOUS

## 2015-06-06 NOTE — Progress Notes (Signed)
1200:  Tolerated infusion w/o adverse reaction.  A&Ox4; VSS; discharged ambulatory.

## 2015-06-06 NOTE — Patient Instructions (Signed)
[place] at [facility] Discharge Instructions  RECOMMENDATIONS MADE BY THE CONSULTANT AND ANY TEST RESULTS WILL BE SENT TO YOUR REFERRING PHYSICIAN.  Lab work today. IVIG infusion today. Return as scheduled in 4 weeks for lab work and IVIG infusion.  Thank you for choosing [place] at [facility] to provide your oncology and hematology care.  To afford each patient quality time with our provider, please arrive at least 15 minutes before your scheduled appointment time.    You need to re-schedule your appointment should you arrive 10 or more minutes late.  We strive to give you quality time with our providers, and arriving late affects you and other patients whose appointments are after yours.  Also, if you no show three or more times for appointments you may be dismissed from the clinic at the providers discretion.     Again, thank you for choosing [facility].  Our hope is that these requests will decrease the amount of time that you wait before being seen by our physicians.       _____________________________________________________________  Should you have questions after your visit to [facility], please contact our office at [phone] between the hours of 8:30 a.m. and 4:30 p.m.  Voicemails left after 4:30 p.m. will not be returned until the following business day.  For prescription refill requests, have your pharmacy contact our office.

## 2015-06-07 LAB — KAPPA/LAMBDA LIGHT CHAINS
KAPPA FREE LGHT CHN: 21.07 mg/L — AB (ref 3.30–19.40)
Kappa, lambda light chain ratio: 1.02 (ref 0.26–1.65)
Lambda free light chains: 20.57 mg/L (ref 5.71–26.30)

## 2015-06-07 LAB — BETA 2 MICROGLOBULIN, SERUM: BETA 2 MICROGLOBULIN: 1.7 mg/L (ref 0.6–2.4)

## 2015-06-08 LAB — MULTIPLE MYELOMA PANEL, SERUM
ALBUMIN SERPL ELPH-MCNC: 2.7 g/dL — AB (ref 2.9–4.4)
ALBUMIN/GLOB SERPL: 1 (ref 0.7–1.7)
ALPHA2 GLOB SERPL ELPH-MCNC: 0.8 g/dL (ref 0.4–1.0)
Alpha 1: 0.3 g/dL (ref 0.0–0.4)
B-Globulin SerPl Elph-Mcnc: 1.2 g/dL (ref 0.7–1.3)
Gamma Glob SerPl Elph-Mcnc: 0.7 g/dL (ref 0.4–1.8)
Globulin, Total: 3 g/dL (ref 2.2–3.9)
IGA: 217 mg/dL (ref 61–437)
IGG (IMMUNOGLOBIN G), SERUM: 624 mg/dL — AB (ref 700–1600)
IGM, SERUM: 19 mg/dL (ref 15–143)
Total Protein ELP: 5.7 g/dL — ABNORMAL LOW (ref 6.0–8.5)

## 2015-06-12 DIAGNOSIS — M1712 Unilateral primary osteoarthritis, left knee: Secondary | ICD-10-CM | POA: Diagnosis not present

## 2015-06-12 DIAGNOSIS — M1711 Unilateral primary osteoarthritis, right knee: Secondary | ICD-10-CM | POA: Diagnosis not present

## 2015-06-19 DIAGNOSIS — M1711 Unilateral primary osteoarthritis, right knee: Secondary | ICD-10-CM | POA: Diagnosis not present

## 2015-06-19 DIAGNOSIS — M75121 Complete rotator cuff tear or rupture of right shoulder, not specified as traumatic: Secondary | ICD-10-CM | POA: Diagnosis not present

## 2015-06-19 DIAGNOSIS — M25511 Pain in right shoulder: Secondary | ICD-10-CM | POA: Diagnosis not present

## 2015-06-19 DIAGNOSIS — M1712 Unilateral primary osteoarthritis, left knee: Secondary | ICD-10-CM | POA: Diagnosis not present

## 2015-06-26 DIAGNOSIS — M170 Bilateral primary osteoarthritis of knee: Secondary | ICD-10-CM | POA: Diagnosis not present

## 2015-06-28 ENCOUNTER — Encounter: Payer: Self-pay | Admitting: Family Medicine

## 2015-06-28 ENCOUNTER — Ambulatory Visit (INDEPENDENT_AMBULATORY_CARE_PROVIDER_SITE_OTHER): Payer: Medicare Other | Admitting: Family Medicine

## 2015-06-28 VITALS — BP 120/74 | Temp 97.9°F | Ht 68.0 in | Wt 178.2 lb

## 2015-06-28 DIAGNOSIS — I10 Essential (primary) hypertension: Secondary | ICD-10-CM

## 2015-06-28 DIAGNOSIS — I2510 Atherosclerotic heart disease of native coronary artery without angina pectoris: Secondary | ICD-10-CM

## 2015-06-28 DIAGNOSIS — M25572 Pain in left ankle and joints of left foot: Secondary | ICD-10-CM | POA: Diagnosis not present

## 2015-06-28 DIAGNOSIS — J329 Chronic sinusitis, unspecified: Secondary | ICD-10-CM | POA: Diagnosis not present

## 2015-06-28 DIAGNOSIS — R21 Rash and other nonspecific skin eruption: Secondary | ICD-10-CM

## 2015-06-28 MED ORDER — AMOXICILLIN 500 MG PO CAPS: 500.0000 mg | ORAL_CAPSULE | Freq: Three times a day (TID) | ORAL | Status: DC

## 2015-06-28 NOTE — Progress Notes (Signed)
   Subjective:    Patient ID: Jeremy Parrish, male    DOB: 1941/10/09, 74 y.o.   MRN: [id number]  Ankle Pain  The incident occurred 12 to 24 hours ago. There was no injury mechanism. The pain is present in the left ankle. The quality of the pain is described as stabbing. The pain is at a severity of 5/10. The pain is moderate. He has tried ice for the symptoms. The treatment provided no relief.  Sinusitis This is a recurrent problem. The current episode started more than 1 month ago. The problem is unchanged. There has been no fever. Associated symptoms include sinus pressure. (Runny nose) Treatments tried: Flonase. The treatment provided no relief.  Rash This is a new problem. The current episode started 1 to 4 weeks ago. The problem has been gradually improving since onset. The affected locations include the right lowerleg and left lower leg. It is unknown if there was an exposure to a precipitant. Past treatments include nothing.   Ibuprofen, started yest and limping on it.  Leg rash bilat did not itch   No obvious cough or wheezing with sinus symptoms. Positive frontal headache congestion drainage and discharge. Next  Actually seen a dermatologist in a couple days.  Recalls no injury to ankle but definitely more uncomfortable with movement and motion   Review of Systems  HENT: Positive for sinus pressure.   Skin: Positive for rash.   no headache no chest pain no back pain     Objective:   Physical Exam  Alert vital stable HET moderate his congestion frontal tenderness pharynx normal neck supple lungs clear. Heart regular rhythm. Left ankle good range of motion no point tenderness leg some venous stasis dermatitis      Assessment & Plan:  Impression 1 rhinosinusitis #2 ankle pain nonspecific #3 rash discussed #4 patient also discussed recent intervention with specialist plan 25 minutes spent most in discussion. Symptom care discussed [place]. May use sterile cream pending  visit to dermatologist. Antibiotics prescribed. WSL

## 2015-06-29 ENCOUNTER — Encounter (HOSPITAL_BASED_OUTPATIENT_CLINIC_OR_DEPARTMENT_OTHER): Payer: Medicare Other | Admitting: Oncology

## 2015-06-29 ENCOUNTER — Encounter (HOSPITAL_COMMUNITY): Payer: Medicare Other | Attending: Oncology

## 2015-06-29 ENCOUNTER — Encounter (HOSPITAL_COMMUNITY): Payer: Self-pay | Admitting: Oncology

## 2015-06-29 ENCOUNTER — Telehealth: Payer: Self-pay | Admitting: Family Medicine

## 2015-06-29 VITALS — BP 134/66 | HR 57 | Temp 98.2°F | Resp 18

## 2015-06-29 DIAGNOSIS — C9000 Multiple myeloma not having achieved remission: Secondary | ICD-10-CM | POA: Diagnosis not present

## 2015-06-29 DIAGNOSIS — D801 Nonfamilial hypogammaglobulinemia: Secondary | ICD-10-CM

## 2015-06-29 DIAGNOSIS — C9001 Multiple myeloma in remission: Secondary | ICD-10-CM | POA: Diagnosis not present

## 2015-06-29 DIAGNOSIS — Z8572 Personal history of non-Hodgkin lymphomas: Secondary | ICD-10-CM

## 2015-06-29 LAB — COMPREHENSIVE METABOLIC PANEL WITH GFR
ALT: 19 U/L (ref 17–63)
AST: 21 U/L (ref 15–41)
Albumin: 3.4 g/dL — ABNORMAL LOW (ref 3.5–5.0)
Alkaline Phosphatase: 53 U/L (ref 38–126)
Anion gap: 4 — ABNORMAL LOW (ref 5–15)
BUN: 12 mg/dL (ref 6–20)
CO2: 24 mmol/L (ref 22–32)
Calcium: 8.4 mg/dL — ABNORMAL LOW (ref 8.9–10.3)
Chloride: 109 mmol/L (ref 101–111)
Creatinine, Ser: 0.89 mg/dL (ref 0.61–1.24)
GFR calc Af Amer: >60 mL/min
GFR calc non Af Amer: >60 mL/min
Glucose, Bld: 113 mg/dL — ABNORMAL HIGH (ref 65–99)
Potassium: 3.7 mmol/L (ref 3.5–5.1)
Sodium: 137 mmol/L (ref 135–145)
Total Bilirubin: 0.6 mg/dL (ref 0.3–1.2)
Total Protein: 6 g/dL — ABNORMAL LOW (ref 6.5–8.1)

## 2015-06-29 LAB — CBC WITH DIFFERENTIAL/PLATELET
BASOS ABS: 0.1 10*3/uL (ref 0.0–0.1)
Basophils Relative: 2 % — ABNORMAL HIGH (ref 0–1)
EOS ABS: 0.1 10*3/uL (ref 0.0–0.7)
EOS PCT: 3 % (ref 0–5)
HCT: 34.6 % — ABNORMAL LOW (ref 39.0–52.0)
Hemoglobin: 11.5 g/dL — ABNORMAL LOW (ref 13.0–17.0)
Lymphocytes Relative: 25 % (ref 12–46)
Lymphs Abs: 1 10*3/uL (ref 0.7–4.0)
MCH: 29.9 pg (ref 26.0–34.0)
MCHC: 33.2 g/dL (ref 30.0–36.0)
MCV: 90.1 fL (ref 78.0–100.0)
Monocytes Absolute: 0.5 10*3/uL (ref 0.1–1.0)
Monocytes Relative: 13 % — ABNORMAL HIGH (ref 3–12)
Neutro Abs: 2.3 10*3/uL (ref 1.7–7.7)
Neutrophils Relative %: 58 % (ref 43–77)
PLATELETS: 112 10*3/uL — AB (ref 150–400)
RBC: 3.84 MIL/uL — AB (ref 4.22–5.81)
RDW: 15.7 % — ABNORMAL HIGH (ref 11.5–15.5)
WBC: 4 10*3/uL (ref 4.0–10.5)

## 2015-06-29 LAB — LACTATE DEHYDROGENASE: LDH: 135 U/L (ref 98–192)

## 2015-06-29 LAB — C-REACTIVE PROTEIN: CRP: <0.5 mg/dL (ref <1.0)

## 2015-06-29 LAB — SEDIMENTATION RATE: Sed Rate: 16 mm/h (ref 0–16)

## 2015-06-29 MED ORDER — HEPARIN SOD (PORK) LOCK FLUSH 100 UNIT/ML IV SOLN
INTRAVENOUS | Status: AC
  Filled 2015-06-29: qty 5

## 2015-06-29 MED ORDER — SODIUM CHLORIDE 0.9 % IV SOLN
Freq: Once | INTRAVENOUS | Status: AC
  Administered 2015-06-29: 10:00:00 via INTRAVENOUS

## 2015-06-29 MED ORDER — FUROSEMIDE 20 MG PO TABS: ORAL_TABLET | ORAL | Status: DC

## 2015-06-29 MED ORDER — IMMUNE GLOBULIN (HUMAN) 20 GM/200ML IV SOLN
400.0000 mg/kg | Freq: Once | INTRAVENOUS | Status: AC
  Administered 2015-06-29: 30 g via INTRAVENOUS
  Filled 2015-06-29: qty 100

## 2015-06-29 MED ORDER — IMMUNE GLOBULIN (HUMAN) 10 GM/100ML IV SOLN: 0.4000 g/kg | Freq: Once | INTRAVENOUS | Status: DC

## 2015-06-29 MED ORDER — SODIUM CHLORIDE 0.9 % IJ SOLN: 10.0000 mL | INTRAMUSCULAR | Status: DC | PRN

## 2015-06-29 MED ORDER — HEPARIN SOD (PORK) LOCK FLUSH 100 UNIT/ML IV SOLN
500.0000 [IU] | Freq: Once | INTRAVENOUS | Status: AC | PRN
  Administered 2015-06-29: 500 [IU]

## 2015-06-29 MED ORDER — NITROGLYCERIN 0.4 MG SL SUBL: 0.4000 mg | SUBLINGUAL_TABLET | SUBLINGUAL | Status: DC | PRN

## 2015-06-29 MED ORDER — ACETAMINOPHEN 325 MG PO TABS: 650.0000 mg | ORAL_TABLET | Freq: Four times a day (QID) | ORAL | Status: DC | PRN

## 2015-06-29 NOTE — Telephone Encounter (Signed)
ntg ok sorry need to spk to pt re lasix? Who rxed? When does he use it? Strength?

## 2015-06-29 NOTE — Assessment & Plan Note (Signed)
Receiving IVIG every 4 weeks without any recent documented infections.  Tolerating well.

## 2015-06-29 NOTE — Patient Instructions (Addendum)
[place] Par[place] at [facility] Discharge Instructions  RECOMMENDATIONS MADE BY THE CONSULTANT AND ANY TEST RESULTS WILL BE SENT TO YOUR REFERRING PHYSICIAN.  Exam and discussion by Robynn Pane, PA-C No changes in therapy. Contact Dr. Wolfgang Phoenix for refills for your nitrogylcerine. Call with issues or concerns. IVIG monthly. Labs and office visit in 8 weeks  Thank you for choosing [place] at [facility] to provide your oncology and hematology care.  To afford each patient quality time with our provider, please arrive at least 15 minutes before your scheduled appointment time.    You need to re-schedule your appointment should you arrive 10 or more minutes late.  We strive to give you quality time with our providers, and arriving late affects you and other patients whose appointments are after yours.  Also, if you no show three or more times for appointments you may be dismissed from the clinic at the providers discretion.     Again, thank you for choosing [facility].  Our hope is that these requests will decrease the amount of time that you wait before being seen by our physicians.       _____________________________________________________________  Should you have questions after your visit to [facility], please contact our office at [phone] between the hours of 8:30 a.m. and 4:30 p.m.  Voicemails left after 4:30 p.m. will not be returned until the following business day.  For prescription refill requests, have your pharmacy contact our office.

## 2015-06-29 NOTE — Progress Notes (Signed)
[name], MD [address] [place]  [address]  Multiple myeloma in remission - Plan: CBC with Differential, Comprehensive metabolic panel, Lactate dehydrogenase, Sedimentation rate, C-reactive protein, Beta 2 microglobuline, serum, Multiple myeloma panel, serum, Kappa/lambda light chains  Hypogammaglobulinemia - Plan: Multiple myeloma panel, serum  Hx of lymphoma - Plan: CBC with Differential, Comprehensive metabolic panel, Lactate dehydrogenase, Sedimentation rate, C-reactive protein, Beta 2 microglobuline, serum  CURRENT THERAPY: Revlimid 10 mg daily for 7 days on and 7 days off after undergoing 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 Marcell Anger. 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 In 2011 he was diagnosed with IgG lambda multiple myeloma and treated with 4 cycles of Velcade and dexamethasone followed by autologous peripheral blood stem cell transplant and currently remains on maintenance Revlimid and also monthly intravenous IgG.  Zometa 4 mg intravenously monthly was discontinued with last treatment on 08/18/2014.  INTERVAL HISTORY: Jeremy Parrish 74 y.o. 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  I personally reviewed and went over laboratory results with the patient.  The results are noted within this dictation.  He denies any recent infections.    He reports a right sided "neuropathy" that is at his umbilicus on the right and radiates to his back.  It does radiate distally then.  He denies it crossing the midline.  Absolutely no rash  noted in the area.  I will defer to PCP at this time.  If rash develops he is to let PCP or [place] know immediately.  He denies any falls or difficulty walking.  He denies any interference with ADLs.  This may be a radicular pain and I will defer to primary care provider.  He requests a refill on SL Nitro and I will defer this to his primary care provider.  Past Medical History  Diagnosis Date  . Hypertension   . Heart disease   . Kidney stones     history  . Lung mass   . Heart murmur   . 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 Coronary artery disease   . Depression   . Shortness of breath   . Peripheral arterial disease   . Bladder neck contracture   . Personal history of other diseases of circulatory system   . Aortic aneurysm of unspecified site without mention of rupture   . Arthritis   . Intestinovesical fistula   . Esophageal reflux   . Hyperlipidemia   . Anemia   . CHF (congestive heart failure)   . COPD (chronic obstructive pulmonary disease)   . Cerebral atherosclerosis     Carotid Doppler, 02/16/2013 - Bilateral Proximal ICAs,demonstrate mild plaque w/o evidence of significant diameter reduction, dissection, or any other vascular abnormality  . Complication of anesthesia   . PONV (postoperative nausea and vomiting)   . Stroke 2013    Speech.  . Hx of bladder cancer 10/07/2012  . Sleep apnea     05-02-14 cpap , not yet used- suggested settings 5  . Cancer   . Prostate  cancer 2000  . Multiple myeloma   . Non Hodgkin's lymphoma   . 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; Hx of bladder cancer; Hyperlipidemia with target LDL less than 100; Prediabetes; Expressive Jeremy Parrish; Hemiplegia affecting right dominant side; Aneurysm of iliac artery; Shortness of breath; Rotator cuff tear arthropathy of right shoulder; Left rotator cuff tear arthropathy; Muscle weakness (generalized); Status post arthroscopy of shoulder; Pain in joint, shoulder region; Fever; Pneumonia; Pancytopenia; Chest pain with moderate risk for cardiac etiology; Inguinal hernia; Obstructive sleep apnea; Central sleep apnea; Hypokalemia; URI (upper respiratory infection); and Weakness on his problem list.     is allergic to diphenhydramine hcl; morphine and related; and tape.  Mr. Crombie does not currently have medications on file.  Past Surgical History  Procedure Laterality Date  . Prostate surgery    . Heart stents x 5  1999  . Portacath placement  07/26/2009    right chest  . Wrist surgery      right  . Left ear skin cancer removed    . Bone marrow transplant  2011  . Pacemaker insertion  07/22/2011    Medtronic  . Coronary angioplasty  06/24/2000    PCI and stenting in mid & proximal RCA  . Insert / replace / remove pacemaker    . Tee without cardioversion  10/13/2012    Procedure: TRANSESOPHAGEAL ECHOCARDIOGRAM (TEE);  Surgeon: Sanda Klein, MD;  Location: [facility] ENDOSCOPY;  Service: Cardiovascular;  Laterality: N/A;  pat/kay/echo notified  . Colonoscopy N/A 01/01/2013    Procedure: COLONOSCOPY;  Surgeon: Rogene Houston, MD;  Location: [facility];  Service: Endoscopy;  Laterality: N/A;  825-moved to [place] notified pt  . Rotator    . Rotator cuff repair Right   . Colon surgery      colon resection  . Bladder surgery    . Shoulder arthroscopy with subacromial decompression Right 07/21/2013    Procedure: RIGHT SHOULDER  ARTHROSCOPY WITH SUBACROMIAL DECOMPRESSION AND DEBRIDEMENT & Injection of Left Shoulder;  Surgeon: Alta Corning, MD;  Location: [facility];  Service: Orthopedics;  Laterality: Right;  . US echocardiography  06/19/2011    RV mildly dilated,mild to mod. MR,mild AI,mild PI  . Nm myocar perf wall motion  11/27/2007    inferior scar  . Inguinal hernia repair Right 05/04/2014    Procedure: OPEN RIGHT INGUINAL HERNIA REPAIR with mesh;  Surgeon: Edward Jolly, MD;  Location: [facility] ORS;  Service: General;  Laterality: Right;    Denies any headaches, dizziness, double vision, fevers, chills, night sweats, nausea, vomiting, diarrhea, constipation, chest pain, heart palpitations, shortness of breath, blood in stool, black tarry stool, urinary pain, urinary burning, urinary frequency, hematuria.   PHYSICAL EXAMINATION  ECOG PERFORMANCE STATUS: 1 - Symptomatic but completely ambulatory  There were no vitals filed for this visit.  GENERAL:alert, no distress, well nourished, well developed, comfortable, cooperative, smiling and Jeremy Parrish SKIN: skin color, texture, turgor are normal, no rashes or significant lesions, No rash HEAD: Normocephalic, No masses, lesions, tenderness or abnormalities EYES: normal, PERRLA, EOMI, Conjunctiva are pink and non-injected EARS: External ears normal OROPHARYNX:mucous  membranes are moist  NECK: supple, no adenopathy, trachea midline LYMPH:  No adenopathy noted. BREAST:not examined LUNGS: clear to auscultation  HEART: regular rate & rhythm ABDOMEN:abdomen soft and normal bowel sounds BACK: Back symmetric, no curvature. NO rash. EXTREMITIES:less then 2 second capillary refill, no joint deformities, effusion, or inflammation, no skin discoloration, no clubbing, no cyanosis  NEURO: alert & oriented x 3 with fluent speech, no focal motor/sensory deficits, gait normal   LABORATORY DATA: CBC    Component Value Date/Time   WBC 2.8* 06/06/2015 0929   RBC 3.85* 06/06/2015 0929     HGB 11.4* 06/06/2015 0929   HCT 34.6* 06/06/2015 0929   PLT 95* 06/06/2015 0929   MCV 89.9 06/06/2015 0929   MCH 29.6 06/06/2015 0929   MCHC 32.9 06/06/2015 0929   RDW 16.0* 06/06/2015 0929   LYMPHSABS 0.8 06/06/2015 0929   MONOABS 0.4 06/06/2015 0929   EOSABS 0.2 06/06/2015 0929   BASOSABS 0.0 06/06/2015 0929      Chemistry      Component Value Date/Time   NA 139 06/06/2015 0929   K 3.7 06/06/2015 0929   CL 106 06/06/2015 0929   CO2 25 06/06/2015 0929   BUN 10 06/06/2015 0929   CREATININE 0.93 06/06/2015 0929   CREATININE 0.80 11/07/2014 1034      Component Value Date/Time   CALCIUM 8.7* 06/06/2015 0929   ALKPHOS 54 06/06/2015 0929   AST 21 06/06/2015 0929   ALT 19 06/06/2015 0929   BILITOT 0.6 06/06/2015 0929     Lab Results  Component Value Date   PROT 6.0* 06/06/2015   ALBUMINELP 56.9 01/06/2015   A1GS 5.1* 01/06/2015   A2GS 10.4 01/06/2015   BETS 7.2 01/06/2015   BETA2SER 5.5 01/06/2015   GAMS 14.9 01/06/2015   MSPIKE NOT DETECTED 01/06/2015   SPEI (NOTE) 01/06/2015   SPECOM (NOTE) 01/06/2015   IGGSERUM 624* 06/06/2015   IGA 217 06/06/2015   IGMSERUM 19 06/06/2015   IMMELINT (NOTE) 01/06/2015   KPAFRELGTCHN 21.07* 06/06/2015   LAMBDASER 20.57 06/06/2015   KAPLAMBRATIO 1.02 06/06/2015    Lab Results  Component Value Date   IRON 99 04/20/2014   TIBC 365 04/20/2014   FERRITIN 54 04/20/2014     RADIOGRAPHIC STUDIES:  No results found.    ASSESSMENT AND PLAN:  Multiple myeloma in remission Pleasant 74 year old male with a history of IgG lambda multiple myeloma status post stem cell transplant in October 2011. He has been on maintenance Revlimid 10 mg for 7 days on and 7 days off without any intolerances to date.  Continue maintenance Revlimid.  Labs in 8 weeks: CBC diff, CMET, CRP, B2M, MM panel.  Return in 8 weeks for follow-up.  He is on ASA daily.  I will defer refilling of Nitro to his primary care provider.  His right sided  discomfort described above I will defer to primary care provider.  I do not see a rash and if one develops he is to report to [facility] or [place] immediately.  Otherwise, exam is benign.  This may require further work-up from primary care provider for possible radiculopathy since it is unilateral.  Peripheral neuropathy is less likely since it is unilateral.  Hypogammaglobulinemia Receiving IVIG every 4 weeks without any recent documented infections.  Tolerating well.   Hx of lymphoma NED   THERAPY PLAN:  Continue with current therapy.  All questions were answered. The patient knows to call the clinic with any problems, questions or concerns. We can certainly  see the patient much sooner if necessary.  Patient and plan discussed with Dr. Ancil Linsey and she is in agreement with the aforementioned.   This note is electronically signed by: Robynn Pane 06/29/2015 10:31 AM

## 2015-06-29 NOTE — Telephone Encounter (Signed)
Medications sent to the pharmacy. Patient was notified.

## 2015-06-29 NOTE — Assessment & Plan Note (Addendum)
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 He has been on maintenance Revlimid 10 mg for 7 days on and 7 days off without any intolerances to date.  Continue maintenance Revlimid.  Labs in 8 weeks: CBC diff, CMET, CRP, B2M, MM panel.  Return in 8 weeks for follow-up.  He is on ASA daily.  I will defer refilling of Nitro to his primary care provider.  His right sided discomfort described above I will defer to primary care provider.  I do not see a rash and if one develops he is to report to [facility] or [place] immediately.  Otherwise, exam is benign.  This may require further work-up from primary care provider for possible radiculopathy since it is unilateral.  Peripheral neuropathy is less likely since it is unilateral.

## 2015-06-29 NOTE — Telephone Encounter (Signed)
Lasix 20 mg, twenty one sparingly prn edema no more than one per wk

## 2015-06-29 NOTE — Telephone Encounter (Signed)
Patient states that Lasix was prescribed several years ago at the [facility] for him. He only uses it when needed. Patient states that he lost his old bottle but he knows that it was the lowest dosage that was prescribed.

## 2015-06-29 NOTE — Assessment & Plan Note (Signed)
NED

## 2015-06-29 NOTE — Telephone Encounter (Signed)
Pt is needing a refill on his nitroglycerin and his lasix.  [facility] [place]

## 2015-06-29 NOTE — Patient Instructions (Signed)
[place] at [facility] Discharge Instructions  RECOMMENDATIONS MADE BY THE CONSULTANT AND ANY TEST RESULTS WILL BE SENT TO YOUR REFERRING PHYSICIAN.  IVIG today Follow up as scheduled Please call the clinic if you have any questions or concerns  Thank you for choosing [place] at [facility] to provide your oncology and hematology care.  To afford each patient quality time with our provider, please arrive at least 15 minutes before your scheduled appointment time.    You need to re-schedule your appointment should you arrive 10 or more minutes late.  We strive to give you quality time with our providers, and arriving late affects you and other patients whose appointments are after yours.  Also, if you no show three or more times for appointments you may be dismissed from the clinic at the providers discretion.     Again, thank you for choosing [facility].  Our hope is that these requests will decrease the amount of time that you wait before being seen by our physicians.       _____________________________________________________________  Should you have questions after your visit to [facility], please contact our office at [phone] between the hours of 8:30 a.m. and 4:30 p.m.  Voicemails left after 4:30 p.m. will not be returned until the following business day.  For prescription refill requests, have your pharmacy contact our office.

## 2015-06-29 NOTE — Progress Notes (Signed)
Jeremy Parrish Tolerated IVIG well today Discharged ambulatory

## 2015-06-29 NOTE — Telephone Encounter (Signed)
Lasix is not on med list. Please advise.

## 2015-06-30 DIAGNOSIS — L720 Epidermal cyst: Secondary | ICD-10-CM | POA: Diagnosis not present

## 2015-06-30 DIAGNOSIS — D692 Other nonthrombocytopenic purpura: Secondary | ICD-10-CM | POA: Diagnosis not present

## 2015-06-30 DIAGNOSIS — B078 Other viral warts: Secondary | ICD-10-CM | POA: Diagnosis not present

## 2015-06-30 LAB — KAPPA/LAMBDA LIGHT CHAINS
Kappa free light chain: 16.55 mg/L (ref 3.30–19.40)
Kappa, lambda light chain ratio: 0.94 (ref 0.26–1.65)
Lambda free light chains: 17.62 mg/L (ref 5.71–26.30)

## 2015-06-30 LAB — BETA 2 MICROGLOBULIN, SERUM: BETA 2 MICROGLOBULIN: 1.9 mg/L (ref 0.6–2.4)

## 2015-07-03 LAB — MULTIPLE MYELOMA PANEL, SERUM
ALBUMIN SERPL ELPH-MCNC: 3.3 g/dL (ref 2.9–4.4)
ALPHA 1: 0.2 g/dL (ref 0.0–0.4)
ALPHA2 GLOB SERPL ELPH-MCNC: 0.6 g/dL (ref 0.4–1.0)
Albumin/Glob SerPl: 1.3 (ref 0.7–1.7)
B-GLOBULIN SERPL ELPH-MCNC: 1 g/dL (ref 0.7–1.3)
Gamma Glob SerPl Elph-Mcnc: 0.8 g/dL (ref 0.4–1.8)
Globulin, Total: 2.6 g/dL (ref 2.2–3.9)
IGG (IMMUNOGLOBIN G), SERUM: 707 mg/dL (ref 700–1600)
IgA: 217 mg/dL (ref 61–437)
IgM, Serum: 20 mg/dL (ref 15–143)
TOTAL PROTEIN ELP: 5.9 g/dL — AB (ref 6.0–8.5)

## 2015-07-12 ENCOUNTER — Other Ambulatory Visit (HOSPITAL_COMMUNITY): Payer: Self-pay | Admitting: Oncology

## 2015-07-12 DIAGNOSIS — C9000 Multiple myeloma not having achieved remission: Secondary | ICD-10-CM

## 2015-07-12 MED ORDER — LENALIDOMIDE 10 MG PO CAPS: 10.0000 mg | ORAL_CAPSULE | Freq: Every day | ORAL | Status: DC

## 2015-07-24 DIAGNOSIS — M170 Bilateral primary osteoarthritis of knee: Secondary | ICD-10-CM | POA: Diagnosis not present

## 2015-07-24 DIAGNOSIS — M25571 Pain in right ankle and joints of right foot: Secondary | ICD-10-CM | POA: Diagnosis not present

## 2015-07-24 DIAGNOSIS — M25572 Pain in left ankle and joints of left foot: Secondary | ICD-10-CM | POA: Diagnosis not present

## 2015-07-27 ENCOUNTER — Encounter (HOSPITAL_COMMUNITY): Payer: Self-pay

## 2015-07-27 ENCOUNTER — Encounter (HOSPITAL_COMMUNITY): Payer: Medicare Other | Attending: Oncology

## 2015-07-27 VITALS — BP 139/69 | HR 91 | Temp 98.0°F | Resp 18 | Wt 181.0 lb

## 2015-07-27 DIAGNOSIS — D801 Nonfamilial hypogammaglobulinemia: Secondary | ICD-10-CM

## 2015-07-27 DIAGNOSIS — C9000 Multiple myeloma not having achieved remission: Secondary | ICD-10-CM | POA: Insufficient documentation

## 2015-07-27 DIAGNOSIS — C9001 Multiple myeloma in remission: Secondary | ICD-10-CM | POA: Insufficient documentation

## 2015-07-27 MED ORDER — SODIUM CHLORIDE 0.9 % IJ SOLN: 10.0000 mL | INTRAMUSCULAR | Status: DC | PRN

## 2015-07-27 MED ORDER — SODIUM CHLORIDE 0.9 % IV SOLN
Freq: Once | INTRAVENOUS | Status: AC
  Administered 2015-07-27: 11:00:00 via INTRAVENOUS

## 2015-07-27 MED ORDER — IMMUNE GLOBULIN (HUMAN) 10 GM/100ML IV SOLN: 0.4000 g/kg | Freq: Once | INTRAVENOUS | Status: DC

## 2015-07-27 MED ORDER — IMMUNE GLOBULIN (HUMAN) 20 GM/200ML IV SOLN
30.0000 g | Freq: Once | INTRAVENOUS | Status: AC
  Administered 2015-07-27: 30 g via INTRAVENOUS
  Filled 2015-07-27: qty 100

## 2015-07-27 MED ORDER — HEPARIN SOD (PORK) LOCK FLUSH 100 UNIT/ML IV SOLN
500.0000 [IU] | Freq: Once | INTRAVENOUS | Status: AC | PRN
Start: 2015-07-27 — End: 2015-07-27
  Administered 2015-07-27: 500 [IU]
  Filled 2015-07-27: qty 5

## 2015-07-27 MED ORDER — ACETAMINOPHEN 325 MG PO TABS
650.0000 mg | ORAL_TABLET | Freq: Four times a day (QID) | ORAL | Status: DC | PRN
  Administered 2015-07-27: 650 mg via ORAL
  Filled 2015-07-27: qty 2

## 2015-07-27 NOTE — Patient Instructions (Signed)
[place] at [facility] Discharge Instructions  RECOMMENDATIONS MADE BY THE CONSULTANT AND ANY TEST RESULTS WILL BE SENT TO YOUR REFERRING PHYSICIAN.  IVIG today Return as scheduled in 1 month Please call the clinic if you have any questions   Thank you for choosing [place] at [facility] to provide your oncology and hematology care.  To afford each patient quality time with our provider, please arrive at least 15 minutes before your scheduled appointment time.    You need to re-schedule your appointment should you arrive 10 or more minutes late.  We strive to give you quality time with our providers, and arriving late affects you and other patients whose appointments are after yours.  Also, if you no show three or more times for appointments you may be dismissed from the clinic at the providers discretion.     Again, thank you for choosing [facility].  Our hope is that these requests will decrease the amount of time that you wait before being seen by our physicians.       _____________________________________________________________  Should you have questions after your visit to [facility], please contact our office at [phone] between the hours of 8:30 a.m. and 4:30 p.m.  Voicemails left after 4:30 p.m. will not be returned until the following business day.  For prescription refill requests, have your pharmacy contact our office.

## 2015-07-27 NOTE — Progress Notes (Signed)
Patient tolerated infusion well.  VSS post infusion.   

## 2015-08-03 ENCOUNTER — Encounter: Payer: Self-pay | Admitting: Nurse Practitioner

## 2015-08-03 ENCOUNTER — Ambulatory Visit (INDEPENDENT_AMBULATORY_CARE_PROVIDER_SITE_OTHER): Payer: Medicare Other | Admitting: Nurse Practitioner

## 2015-08-03 VITALS — BP 132/86 | Temp 98.0°F | Ht 68.0 in | Wt 182.4 lb

## 2015-08-03 DIAGNOSIS — B009 Herpesviral infection, unspecified: Secondary | ICD-10-CM

## 2015-08-03 DIAGNOSIS — Z23 Encounter for immunization: Secondary | ICD-10-CM

## 2015-08-03 DIAGNOSIS — I2510 Atherosclerotic heart disease of native coronary artery without angina pectoris: Secondary | ICD-10-CM | POA: Diagnosis not present

## 2015-08-03 DIAGNOSIS — L03211 Cellulitis of face: Secondary | ICD-10-CM

## 2015-08-03 MED ORDER — VALACYCLOVIR HCL 1 G PO TABS: ORAL_TABLET | ORAL | Status: DC

## 2015-08-03 MED ORDER — DOXYCYCLINE HYCLATE 100 MG PO TABS: 100.0000 mg | ORAL_TABLET | Freq: Two times a day (BID) | ORAL | Status: DC

## 2015-08-03 NOTE — Patient Instructions (Signed)
Zolpidem as directed for sleep.

## 2015-08-07 ENCOUNTER — Ambulatory Visit (HOSPITAL_COMMUNITY)
Admission: RE | Admit: 2015-08-07 | Discharge: 2015-08-07 | Disposition: A | Discharge status: Home / Self Care | Payer: Medicare Other | Source: Ambulatory Visit | Attending: Nurse Practitioner | Admitting: Nurse Practitioner

## 2015-08-07 ENCOUNTER — Encounter: Payer: Self-pay | Admitting: Nurse Practitioner

## 2015-08-07 ENCOUNTER — Ambulatory Visit (INDEPENDENT_AMBULATORY_CARE_PROVIDER_SITE_OTHER): Payer: Medicare Other | Admitting: Nurse Practitioner

## 2015-08-07 VITALS — BP 130/82 | Temp 98.3°F | Ht 68.0 in | Wt 180.0 lb

## 2015-08-07 DIAGNOSIS — M5441 Lumbago with sciatica, right side: Secondary | ICD-10-CM

## 2015-08-07 DIAGNOSIS — M4316 Spondylolisthesis, lumbar region: Secondary | ICD-10-CM | POA: Insufficient documentation

## 2015-08-07 DIAGNOSIS — M5136 Other intervertebral disc degeneration, lumbar region: Secondary | ICD-10-CM | POA: Diagnosis not present

## 2015-08-07 DIAGNOSIS — I2510 Atherosclerotic heart disease of native coronary artery without angina pectoris: Secondary | ICD-10-CM

## 2015-08-07 DIAGNOSIS — B029 Zoster without complications: Secondary | ICD-10-CM

## 2015-08-07 DIAGNOSIS — M545 Low back pain: Secondary | ICD-10-CM | POA: Diagnosis not present

## 2015-08-07 MED ORDER — CELECOXIB 100 MG PO CAPS: 100.0000 mg | ORAL_CAPSULE | Freq: Two times a day (BID) | ORAL | Status: DC

## 2015-08-07 MED ORDER — VALACYCLOVIR HCL 1 G PO TABS: 1000.0000 mg | ORAL_TABLET | Freq: Three times a day (TID) | ORAL | Status: DC

## 2015-08-07 NOTE — Patient Instructions (Addendum)
Start Celecoxib for back pain as directed Stop Doxycyline Start Valtrex (sent in) Get back xray After shingles has resolved, may get shingles vaccine   Shingles Shingles (herpes zoster) is an infection that is caused by the same virus that causes chickenpox (varicella). The infection causes a painful skin rash and fluid-filled blisters, which eventually break open, crust over, and heal. It may occur in any area of the body, but it usually affects only one side of the body or face. The pain of shingles usually lasts about 1 month. However, some people with shingles may develop long-term (chronic) pain in the affected area of the body. Shingles often occurs many years after the person had chickenpox. It is more common:  In people older than 50 years.  In people with weakened immune systems, such as those with HIV, AIDS, or cancer.  In people taking medicines that weaken the immune system, such as transplant medicines.  In people under great stress. CAUSES  Shingles is caused by the varicella zoster virus (VZV), which also causes chickenpox. After a person is infected with the virus, it can remain in the person's body for years in an inactive state (dormant). To cause shingles, the virus reactivates and breaks out as an infection in a nerve root. The virus can be spread from person to person (contagious) through contact with open blisters of the shingles rash. It will only spread to people who have not had chickenpox. When these people are exposed to the virus, they may develop chickenpox. They will not develop shingles. Once the blisters scab over, the person is no longer contagious and cannot spread the virus to others. SIGNS AND SYMPTOMS  Shingles shows up in stages. The initial symptoms may be pain, itching, and tingling in an area of the skin. This pain is usually described as burning, stabbing, or throbbing.In a few days or weeks, a painful red rash will appear in the area where the pain,  itching, and tingling were felt. The rash is usually on one side of the body in a band or belt-like pattern. Then, the rash usually turns into fluid-filled blisters. They will scab over and dry up in approximately 2-3 weeks. Flu-like symptoms may also occur with the initial symptoms, the rash, or the blisters. These may include:  Fever.  Chills.  Headache.  Upset stomach. DIAGNOSIS  Your health care provider will perform a skin exam to diagnose shingles. Skin scrapings or fluid samples may also be taken from the blisters. This sample will be examined under a microscope or sent to a lab for further testing. TREATMENT  There is no specific cure for shingles. Your health care provider will likely prescribe medicines to help you manage the pain, recover faster, and avoid long-term problems. This may include antiviral drugs, anti-inflammatory drugs, and pain medicines. HOME CARE INSTRUCTIONS   Take a cool bath or apply cool compresses to the area of the rash or blisters as directed. This may help with the pain and itching.   Take medicines only as directed by your health care provider.   Rest as directed by your health care provider.  Keep your rash and blisters clean with mild soap and cool water or as directed by your health care provider.  Do not pick your blisters or scratch your rash. Apply an anti-itch cream or numbing creams to the affected area as directed by your health care provider.  Keep your shingles rash covered with a loose bandage (dressing).  Avoid skin contact with:  Babies.   Pregnant women.   Children with eczema.   Elderly people with transplants.   People with chronic illnesses, such as leukemia or AIDS.   Wear loose-fitting clothing to help ease the pain of material rubbing against the rash.  Keep all follow-up visits as directed by your health care provider.If the area involved is on your face, you may receive a referral for a specialist, such as  an eye doctor (ophthalmologist) or an ear, nose, and throat (ENT) doctor. Keeping all follow-up visits will help you avoid eye problems, chronic pain, or disability.  SEEK IMMEDIATE MEDICAL CARE IF:   You have facial pain, pain around the eye area, or loss of feeling on one side of your face.  You have ear pain or ringing in your ear.  You have loss of taste.  Your pain is not relieved with prescribed medicines.   Your redness or swelling spreads.   You have more pain and swelling.  Your condition is worsening or has changed.   You have a fever. MAKE SURE YOU:  Understand these instructions.  Will watch your condition.  Will get help right away if you are not doing well or get worse. Document Released: 10/28/2005 Document Revised: 03/14/2014 Document Reviewed: 06/11/2012 [facility] Patient Information 2015 [place], [place]. This information is not intended to replace advice given to you by your health care provider. Make sure you discuss any questions you have with your health care provider.

## 2015-08-08 ENCOUNTER — Other Ambulatory Visit (HOSPITAL_COMMUNITY): Payer: Self-pay

## 2015-08-08 ENCOUNTER — Encounter: Payer: Self-pay | Admitting: Nurse Practitioner

## 2015-08-08 NOTE — Progress Notes (Signed)
Subjective:  Presents for recheck of the sore area on his right upper lip see previous note. Has not improved with current meds, also doxycycline is "messing up his stomach". No fever. No eye pain or visual changes. Also complaints of right low back pain over the past 4-5 months, occurs every day. Begins in the right low back area does down the leg to the foot. No weakness or numbness of the right foot. Has not identified any specific triggers. Has not tried anything to help the pain. No change in bowel or bladder habits.  Objective:   BP 130/82 mmHg  Temp(Src) 98.3 F (36.8 C) (Oral)  Ht 5\' 8"  (1.727 m)  Wt 180 lb (81.647 kg)  BMI 27.38 kg/m2 NAD. Alert, oriented. Continues to have a large moderately erythematous raised area between the margin of the right upper lip to the nose going almost to the corner of the mouth. Does not go past midline. No lesions noted on the end of the nose. Conjunctiva clear. A few faint pink papules are noted at the side of the face in a dermatomal line. Lungs clear. Heart regular rate rhythm. No tenderness noted with palpation of the lumbar area. SLR negative bilateral. Reflexes normal limit lower extremities. Gait normal limit.  Assessment: Herpes zoster  Right-sided low back pain with right-sided sciatica - Plan: DG Lumbar Spine Complete  Plan:  Meds ordered this encounter  Medications  . celecoxib (CELEBREX) 100 MG capsule    Sig: Take 1 capsule (100 mg total) by mouth 2 (two) times daily.    Dispense:  60 capsule    Refill:  2    Order Specific Question:  Supervising Provider    Answer:  Mikey Kirschner [[id number]]  . valACYclovir (VALTREX) 1000 MG tablet    Sig: Take 1 tablet (1,000 mg total) by mouth 3 (three) times daily.    Dispense:  21 tablet    Refill:  0    Order Specific Question:  Supervising Provider    Answer:  Mikey Kirschner [[id number]]   Stop doxycycline. Restart Celebrex as directed for back pain. Ice/heat applications. Back exercises as  tolerated. Call back if persists. Start valacyclovir as directed. Patient understands it may take time for this rash to resolve with possible continued discomfort along the nerve. Once symptoms have resolved, patient given prescription and information on Zostavax. Call back if any further problems.

## 2015-08-08 NOTE — Progress Notes (Signed)
Subjective:  Presents for complaints of a sore area of the right side of the face just above his lip. tender to palpation. No fever. No unusual fatigue. No other rash. Has a history of fever blisters, has been using Abreva. Has had a small rash in the area for a couple weeks which he calls fever blisters, much worse over the past day. No headache or visual changes.  Objective:   BP 132/86 mmHg  Temp(Src) 98 F (36.7 C) (Oral)  Ht 5\' 8"  (1.727 m)  Wt 182 lb 6.4 oz (82.736 kg)  BMI 27.74 kg/m2 NAD. Alert, oriented. Confluent erythematous slightly raised area noted between the right side the nose in the upper lip margin. In the center there is a tiny white pustule. Mildly tender to palpation. No other rash is noted.  Assessment: Herpes simplex type 1 infection  Cellulitis of face  Encounter for immunization  Plan: Meds ordered this encounter  Medications  . DISCONTD: valACYclovir (VALTREX) 1000 MG tablet    Sig: 2 po BID 12 hours apart x 2 doses    Dispense:  4 tablet    Refill:  2    Order Specific Question:  Supervising Provider    Answer:  Mikey Kirschner [[id number]]  . doxycycline (VIBRA-TABS) 100 MG tablet    Sig: Take 1 tablet (100 mg total) by mouth 2 (two) times daily.    Dispense:  14 tablet    Refill:  0    Order Specific Question:  Supervising Provider    Answer:  Mikey Kirschner [[id number]]   Given doxycycline to cover possible secondary infection. Warning signs reviewed. Call back next week if no improvement, sooner if worse.

## 2015-08-14 ENCOUNTER — Encounter (HOSPITAL_COMMUNITY): Payer: Medicare Other | Attending: Oncology

## 2015-08-14 ENCOUNTER — Telehealth: Payer: Self-pay | Admitting: *Deleted

## 2015-08-14 ENCOUNTER — Other Ambulatory Visit: Payer: Self-pay | Admitting: Nurse Practitioner

## 2015-08-14 DIAGNOSIS — C9000 Multiple myeloma not having achieved remission: Secondary | ICD-10-CM | POA: Insufficient documentation

## 2015-08-14 DIAGNOSIS — C9001 Multiple myeloma in remission: Secondary | ICD-10-CM | POA: Diagnosis present

## 2015-08-14 DIAGNOSIS — Z8572 Personal history of non-Hodgkin lymphomas: Secondary | ICD-10-CM

## 2015-08-14 DIAGNOSIS — D801 Nonfamilial hypogammaglobulinemia: Secondary | ICD-10-CM

## 2015-08-14 LAB — CBC WITH DIFFERENTIAL/PLATELET
BASOS ABS: 0 10*3/uL (ref 0.0–0.1)
Basophils Relative: 1 %
EOS PCT: 6 %
Eosinophils Absolute: 0.2 10*3/uL (ref 0.0–0.7)
HCT: 37.1 % — ABNORMAL LOW (ref 39.0–52.0)
HEMOGLOBIN: 12.2 g/dL — AB (ref 13.0–17.0)
LYMPHS PCT: 33 %
Lymphs Abs: 1.3 10*3/uL (ref 0.7–4.0)
MCH: 29.4 pg (ref 26.0–34.0)
MCHC: 32.9 g/dL (ref 30.0–36.0)
MCV: 89.4 fL (ref 78.0–100.0)
Monocytes Absolute: 0.4 10*3/uL (ref 0.1–1.0)
Monocytes Relative: 10 %
NEUTROS ABS: 1.9 10*3/uL (ref 1.7–7.7)
NEUTROS PCT: 50 %
PLATELETS: 133 10*3/uL — AB (ref 150–400)
RBC: 4.15 MIL/uL — AB (ref 4.22–5.81)
RDW: 14.4 % (ref 11.5–15.5)
WBC: 3.9 10*3/uL — AB (ref 4.0–10.5)

## 2015-08-14 LAB — COMPREHENSIVE METABOLIC PANEL
ALT: 19 U/L (ref 17–63)
AST: 24 U/L (ref 15–41)
Albumin: 3.6 g/dL (ref 3.5–5.0)
Alkaline Phosphatase: 85 U/L (ref 38–126)
Anion gap: 6 (ref 5–15)
BUN: 10 mg/dL (ref 6–20)
CHLORIDE: 107 mmol/L (ref 101–111)
CO2: 24 mmol/L (ref 22–32)
CREATININE: 0.77 mg/dL (ref 0.61–1.24)
Calcium: 8.3 mg/dL — ABNORMAL LOW (ref 8.9–10.3)
GFR calc Af Amer: >60 mL/min (ref >60)
Glucose, Bld: 77 mg/dL (ref 65–99)
Potassium: 3.8 mmol/L (ref 3.5–5.1)
Sodium: 137 mmol/L (ref 135–145)
Total Bilirubin: 0.5 mg/dL (ref 0.3–1.2)
Total Protein: 6.7 g/dL (ref 6.5–8.1)

## 2015-08-14 LAB — LACTATE DEHYDROGENASE: LDH: 140 U/L (ref 98–192)

## 2015-08-14 LAB — C-REACTIVE PROTEIN: CRP: <0.5 mg/dL (ref <1.0)

## 2015-08-14 LAB — SEDIMENTATION RATE: Sed Rate: 14 mm/hr (ref 0–16)

## 2015-08-14 MED ORDER — VALACYCLOVIR HCL 1 G PO TABS: 1000.0000 mg | ORAL_TABLET | Freq: Three times a day (TID) | ORAL | Status: DC

## 2015-08-14 NOTE — Telephone Encounter (Signed)
Pt.notified

## 2015-08-14 NOTE — Progress Notes (Signed)
Lab draw

## 2015-08-14 NOTE — Telephone Encounter (Signed)
Pt seen last week for shingles. Completed med. Pt states rash is better but not cleared all the way up. Pt wants to know if he needs another round. [facility] [place].

## 2015-08-14 NOTE — Telephone Encounter (Signed)
Will refill x 1 although it still make take time for the rash and pain to completely resolve

## 2015-08-15 ENCOUNTER — Other Ambulatory Visit (HOSPITAL_COMMUNITY): Payer: Self-pay | Admitting: Oncology

## 2015-08-15 ENCOUNTER — Other Ambulatory Visit (HOSPITAL_COMMUNITY): Payer: Medicare Other

## 2015-08-15 DIAGNOSIS — C9001 Multiple myeloma in remission: Secondary | ICD-10-CM

## 2015-08-15 LAB — KAPPA/LAMBDA LIGHT CHAINS
Kappa free light chain: 23.6 mg/L — ABNORMAL HIGH (ref 3.30–19.40)
Kappa, lambda light chain ratio: 1.02 (ref 0.26–1.65)
Lambda free light chains: 23.1 mg/L (ref 5.71–26.30)

## 2015-08-15 LAB — BETA 2 MICROGLOBULIN, SERUM: BETA 2 MICROGLOBULIN: 1.9 mg/L (ref 0.6–2.4)

## 2015-08-15 LAB — MULTIPLE MYELOMA PANEL, SERUM
Albumin SerPl Elph-Mcnc: 3.6 g/dL (ref 2.9–4.4)
Albumin/Glob SerPl: 1.3 (ref 0.7–1.7)
Alpha 1: 0.2 g/dL (ref 0.0–0.4)
Alpha2 Glob SerPl Elph-Mcnc: 0.7 g/dL (ref 0.4–1.0)
B-Globulin SerPl Elph-Mcnc: 1 g/dL (ref 0.7–1.3)
Gamma Glob SerPl Elph-Mcnc: 0.9 g/dL (ref 0.4–1.8)
Globulin, Total: 2.8 g/dL (ref 2.2–3.9)
IgA: 231 mg/dL (ref 61–437)
IgG (Immunoglobin G), Serum: 854 mg/dL (ref 700–1600)
IgM, Serum: 32 mg/dL (ref 15–143)
Total Protein ELP: 6.4 g/dL (ref 6.0–8.5)

## 2015-08-15 MED ORDER — LENALIDOMIDE 10 MG PO CAPS: 10.0000 mg | ORAL_CAPSULE | Freq: Every day | ORAL | Status: DC

## 2015-08-17 ENCOUNTER — Other Ambulatory Visit (HOSPITAL_COMMUNITY): Payer: Medicare Other

## 2015-08-17 ENCOUNTER — Other Ambulatory Visit (HOSPITAL_COMMUNITY): Payer: Self-pay | Admitting: *Deleted

## 2015-08-17 DIAGNOSIS — E876 Hypokalemia: Secondary | ICD-10-CM

## 2015-08-17 DIAGNOSIS — C9000 Multiple myeloma not having achieved remission: Secondary | ICD-10-CM

## 2015-08-17 MED ORDER — POTASSIUM CHLORIDE ER 20 MEQ PO TBCR: 20.0000 meq | EXTENDED_RELEASE_TABLET | Freq: Two times a day (BID) | ORAL | Status: DC

## 2015-08-17 NOTE — Progress Notes (Signed)
Message left with patient to verify how he is taking potassium

## 2015-08-17 NOTE — Progress Notes (Signed)
Per patient he takes potassium 20 meq twice a day

## 2015-08-21 DIAGNOSIS — J449 Chronic obstructive pulmonary disease, unspecified: Secondary | ICD-10-CM | POA: Diagnosis not present

## 2015-08-21 DIAGNOSIS — Z9481 Bone marrow transplant status: Secondary | ICD-10-CM | POA: Diagnosis not present

## 2015-08-21 DIAGNOSIS — J479 Bronchiectasis, uncomplicated: Secondary | ICD-10-CM | POA: Diagnosis not present

## 2015-08-21 DIAGNOSIS — I10 Essential (primary) hypertension: Secondary | ICD-10-CM | POA: Diagnosis not present

## 2015-08-21 DIAGNOSIS — G629 Polyneuropathy, unspecified: Secondary | ICD-10-CM | POA: Diagnosis not present

## 2015-08-21 DIAGNOSIS — Z87891 Personal history of nicotine dependence: Secondary | ICD-10-CM | POA: Diagnosis not present

## 2015-08-21 DIAGNOSIS — Z79899 Other long term (current) drug therapy: Secondary | ICD-10-CM | POA: Diagnosis not present

## 2015-08-23 ENCOUNTER — Encounter (HOSPITAL_BASED_OUTPATIENT_CLINIC_OR_DEPARTMENT_OTHER): Payer: Medicare Other

## 2015-08-23 ENCOUNTER — Encounter (HOSPITAL_COMMUNITY): Payer: Self-pay | Admitting: Hematology & Oncology

## 2015-08-23 ENCOUNTER — Encounter (HOSPITAL_BASED_OUTPATIENT_CLINIC_OR_DEPARTMENT_OTHER): Payer: Medicare Other | Admitting: Hematology & Oncology

## 2015-08-23 VITALS — BP 126/87 | HR 62 | Temp 97.7°F | Resp 18

## 2015-08-23 VITALS — BP 121/67 | HR 61 | Temp 97.8°F | Resp 16 | Wt 184.4 lb

## 2015-08-23 DIAGNOSIS — D801 Nonfamilial hypogammaglobulinemia: Secondary | ICD-10-CM

## 2015-08-23 DIAGNOSIS — Z8572 Personal history of non-Hodgkin lymphomas: Secondary | ICD-10-CM

## 2015-08-23 DIAGNOSIS — C9000 Multiple myeloma not having achieved remission: Secondary | ICD-10-CM

## 2015-08-23 DIAGNOSIS — I639 Cerebral infarction, unspecified: Secondary | ICD-10-CM

## 2015-08-23 MED ORDER — HEPARIN SOD (PORK) LOCK FLUSH 100 UNIT/ML IV SOLN
500.0000 [IU] | Freq: Once | INTRAVENOUS | Status: DC | PRN
  Filled 2015-08-23: qty 5

## 2015-08-23 MED ORDER — ACETAMINOPHEN 325 MG PO TABS
650.0000 mg | ORAL_TABLET | Freq: Four times a day (QID) | ORAL | Status: DC | PRN
  Administered 2015-08-23: 650 mg via ORAL
  Filled 2015-08-23: qty 2

## 2015-08-23 MED ORDER — SODIUM CHLORIDE 0.9 % IJ SOLN: 10.0000 mL | INTRAMUSCULAR | Status: DC | PRN

## 2015-08-23 MED ORDER — SODIUM CHLORIDE 0.9 % IV SOLN
Freq: Once | INTRAVENOUS | Status: AC
  Administered 2015-08-23: 10:00:00 via INTRAVENOUS

## 2015-08-23 MED ORDER — IMMUNE GLOBULIN (HUMAN) 10 GM/100ML IV SOLN
0.4000 g/kg | Freq: Once | INTRAVENOUS | Status: AC
  Administered 2015-08-23: 35 g via INTRAVENOUS
  Filled 2015-08-23: qty 50

## 2015-08-23 NOTE — Patient Instructions (Signed)
[facility] Discharge Instructions for Patients Receiving Chemotherapy  Today you received the following chemotherapy agents IVIG Referral to PT Return in one month to see the doctor and to have IVIG again.  Please call the clinic if you have any questions or concerns  To help prevent nausea and vomiting after your treatment, we encourage you to take your nausea medication  If you develop nausea and vomiting, or diarrhea that is not controlled by your medication, call the clinic.  The clinic phone number is [phone]. Office hours are Monday-Friday 8:30am-5:00pm.  BELOW ARE SYMPTOMS THAT SHOULD BE REPORTED IMMEDIATELY:  *FEVER GREATER THAN 101.0 F  *CHILLS WITH OR WITHOUT FEVER  NAUSEA AND VOMITING THAT IS NOT CONTROLLED WITH YOUR NAUSEA MEDICATION  *UNUSUAL SHORTNESS OF BREATH  *UNUSUAL BRUISING OR BLEEDING  TENDERNESS IN MOUTH AND THROAT WITH OR WITHOUT PRESENCE OF ULCERS  *URINARY PROBLEMS  *BOWEL PROBLEMS  UNUSUAL RASH Items with * indicate a potential emergency and should be followed up as soon as possible. If you have an emergency after office hours please contact your primary care physician or go to the nearest emergency department.  Please call the clinic during office hours if you have any questions or concerns.   You may also contact the Patient Navigator at [phone] should you have any questions or need assistance in obtaining follow up care. _____________________________________________________________________ Have you asked about our STAR program?    STAR stands for Survivorship Training and Rehabilitation, and this is a nationally recognized cancer care program that focuses on survivorship and rehabilitation.  Cancer and cancer treatments may cause problems, such as, pain, making you feel tired and keeping you from doing the things that you need or want to do. Cancer rehabilitation can help. Our goal is to reduce these troubling effects  and help you have the best quality of life possible.  You may receive a survey from a nurse that asks questions about your current state of health.  Based on the survey results, all eligible patients will be referred to the [facility] program for an evaluation so we can better serve you! A frequently asked questions sheet is available upon request.

## 2015-08-23 NOTE — Progress Notes (Signed)
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 Marcell Anger. 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 In 2011 he was diagnosed with IgG lambda multiple myeloma and treated with 4 cycles of Velcade and dexamethasone followed by autologous peripheral blood stem cell transplant and currently remains on maintenance Revlimid and also monthly intravenous IgG.  Zometa 4 mg intravenously monthly was discontinued with last treatment on 08/18/2014.   DIAGNOSIS: IgG lambda Myeloma   CURRENT THERAPY: IVIG  Revlimid 10 mg 7 on/7 off  INTERVAL HISTORY: Jeremy Parrish 74 y.o. 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  Hematologically, he denies any complaints and ROS questioning is negative.  Jeremy Parrish is here alone today, receiving IVIG. He is reclining in the bed. He recently had shingles, which he treated with Valtrex. When asked about his shingles, he confirms that they are almost gone. He denies any pain, other than just a little bit. He comments that Dr. Lance Sell PA treated him.  He mainly expressed some concerns about being unsteady on his feet, but says that he's trying to be careful.  He has had his flu shot. He confirms that he can afford things on his current insurance.  He wanted to know why he's been getting weaker, but says "I don't know," when asked if he wants physical therapy. He asked  what physical therapy consists of. He eventually stated "I'd like to try it." He confirms that he does not want to fall, and that he wants to be more stable.  He confirms that his heart skips around a bit. He says he goes to [facility] in [place] and has seen a cardiologist there.  He denies any pain in his belly.  Jeremy Parrish also reports that his wife is getting her hip replaced on Monday.   Past Medical History  Diagnosis Date  . Hypertension   . Heart disease   . Kidney stones     history  . Lung mass   . Heart murmur   . 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 Coronary artery disease   . Depression   . Shortness of breath   . Peripheral arterial disease ([place])   . Bladder neck contracture   . Personal history of other diseases of circulatory system   . Aortic aneurysm of unspecified site without mention of rupture   . Arthritis   . Intestinovesical fistula   . Esophageal reflux   . Hyperlipidemia   . Anemia   . CHF (congestive heart failure) ([place])   . COPD (chronic obstructive pulmonary disease) ([place])   . Cerebral atherosclerosis     Carotid Doppler, 02/16/2013 - Bilateral Proximal ICAs,demonstrate mild plaque w/o evidence of significant diameter reduction, dissection, or any other vascular abnormality  . Complication of anesthesia   . PONV (postoperative nausea and vomiting)   . Stroke [facility]) 2013    Speech.  Marland Kitchen  Hx of bladder cancer 10/07/2012  . Sleep apnea     05-02-14 cpap , not yet used- suggested settings 5  . Cancer ([place])   . Prostate cancer ([place]) 2000  . Multiple myeloma   . Non Hodgkin's lymphoma ([place])   . 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; Hx of bladder cancer; Hyperlipidemia with target LDL less than 100; Prediabetes; Expressive aphasia; Hemiplegia affecting right dominant side ([place]); Aneurysm of iliac artery ([place]); Shortness of breath; Rotator cuff tear arthropathy of right shoulder; Left rotator cuff tear arthropathy; Muscle weakness (generalized); Status post arthroscopy of shoulder; Pain in joint, shoulder region; Fever; Pneumonia; Pancytopenia ([place]); Chest pain with moderate risk for cardiac etiology; Inguinal hernia; Obstructive sleep apnea; Central sleep apnea; Hypokalemia; URI (upper respiratory infection); and Weakness on his problem list.     is allergic to diphenhydramine hcl; morphine and related; and tape.  Jeremy Parrish had no medications administered during this visit.  Past Surgical History  Procedure Laterality Date  . Prostate surgery    . Heart stents x 5  1999  . Portacath placement  07/26/2009    right chest  . Wrist surgery      right  . Left ear skin cancer removed    . Bone marrow transplant  2011  . Pacemaker insertion  07/22/2011    Medtronic  . Coronary angioplasty  06/24/2000    PCI and stenting in mid & proximal RCA  . Insert / replace / remove pacemaker    . Tee without cardioversion  10/13/2012    Procedure: TRANSESOPHAGEAL ECHOCARDIOGRAM (TEE);  Surgeon: Sanda Klein, MD;  Location: [facility] ENDOSCOPY;  Service: Cardiovascular;  Laterality: N/A;  pat/kay/echo notified  . Colonoscopy N/A 01/01/2013    Procedure: COLONOSCOPY;  Surgeon: Rogene Houston, MD;  Location: [facility] ENDO SUITE;  Service: Endoscopy;  Laterality: N/A;  825-moved to [place] notified pt  . Rotator    . Rotator cuff repair Right   . Colon surgery      colon resection    . Bladder surgery    . Shoulder arthroscopy with subacromial decompression Right 07/21/2013    Procedure: RIGHT SHOULDER ARTHROSCOPY WITH SUBACROMIAL DECOMPRESSION AND DEBRIDEMENT & Injection of Left Shoulder;  Surgeon: Alta Corning, MD;  Location: [facility];  Service: Orthopedics;  Laterality: Right;  . US echocardiography  06/19/2011    RV mildly dilated,mild to mod. MR,mild AI,mild PI  . Nm myocar perf wall motion  11/27/2007    inferior scar  . Inguinal hernia repair Right 05/04/2014    Procedure: OPEN RIGHT INGUINAL HERNIA REPAIR with mesh;  Surgeon: Edward Jolly, MD;  Location: [facility] ORS;  Service: General;  Laterality: Right;    Denies any headaches, dizziness, double vision, fevers, chills, night sweats, nausea, vomiting, diarrhea, constipation, chest pain, heart palpitations, shortness of breath, blood in stool, black tarry stool, urinary pain, urinary burning, urinary frequency, hematuria.  14 point review of systems was performed and is negative except as detailed under history of present illness and above   PHYSICAL EXAMINATION ECOG PERFORMANCE STATUS: 1 - Symptomatic but completely  ambulatory  Filed Vitals:   08/23/15 0945  BP:   Pulse: 62  Temp:   Resp:     GENERAL:alert, no distress, well nourished, well developed, comfortable, cooperative, smiling and aphasia SKIN: skin color, texture, turgor are normal, no rashes or significant lesions HEAD: Normocephalic, No masses, lesions, tenderness or abnormalities EYES: normal, PERRLA, EOMI, Conjunctiva are pink and non-injected EARS: External ears normal OROPHARYNX:mucous membranes are moist  NECK: supple, no adenopathy, trachea midline LYMPH:  not examined BREAST:not examined LUNGS: clear to auscultation  HEART: regular rate & rhythm ABDOMEN:abdomen soft and normal bowel sounds No rebound, no guarding. No palpable masses BACK: Back symmetric, no curvature. EXTREMITIES:less then 2 second capillary refill, no joint  deformities, effusion, or inflammation, no skin discoloration, no clubbing, no cyanosis  NEURO: alert & oriented x 3 , no focal motor/sensory deficits, gait normal   LABORATORY DATA: I have reviewed the data as listed  CBC    Component Value Date/Time   WBC 3.9* 08/14/2015 1050   RBC 4.15* 08/14/2015 1050   HGB 12.2* 08/14/2015 1050   HCT 37.1* 08/14/2015 1050   PLT 133* 08/14/2015 1050   MCV 89.4 08/14/2015 1050   MCH 29.4 08/14/2015 1050   MCHC 32.9 08/14/2015 1050   RDW 14.4 08/14/2015 1050   LYMPHSABS 1.3 08/14/2015 1050   MONOABS 0.4 08/14/2015 1050   EOSABS 0.2 08/14/2015 1050   BASOSABS 0.0 08/14/2015 1050      Chemistry      Component Value Date/Time   NA 137 08/14/2015 1050   K 3.8 08/14/2015 1050   CL 107 08/14/2015 1050   CO2 24 08/14/2015 1050   BUN 10 08/14/2015 1050   CREATININE 0.77 08/14/2015 1050   CREATININE 0.80 11/07/2014 1034      Component Value Date/Time   CALCIUM 8.3* 08/14/2015 1050   ALKPHOS 85 08/14/2015 1050   AST 24 08/14/2015 1050   ALT 19 08/14/2015 1050   BILITOT 0.5 08/14/2015 1050     Lab Results  Component Value Date   PROT 6.7 08/14/2015   ALBUMINELP 56.9 01/06/2015   A1GS 5.1* 01/06/2015   A2GS 10.4 01/06/2015   BETS 7.2 01/06/2015   BETA2SER 5.5 01/06/2015   GAMS 14.9 01/06/2015   MSPIKE NOT DETECTED 01/06/2015   SPEI (NOTE) 01/06/2015   SPECOM (NOTE) 01/06/2015   IGGSERUM 854 08/14/2015   IGA 231 08/14/2015   IGMSERUM 32 08/14/2015   IMMELINT (NOTE) 01/06/2015   KPAFRELGTCHN 23.60* 08/14/2015   LAMBDASER 23.10 08/14/2015   KAPLAMBRATIO 1.02 08/14/2015   Lab Results  Component Value Date   IRON 99 04/20/2014   TIBC 365 04/20/2014   FERRITIN 54 04/20/2014     ASSESSMENT AND PLAN:  IgG lambda myeloma on revlimid.  Marginal zone lymphoma Hypogammaglobulinemia CVA  I reviewed his laboratory studies with him and advised him that things look good. He tolerates his Revlimid without difficulty and therefore we  will just continue. He is on IVIG for hypogammaglobulinemia . He tolerates it very well and has had no recent problems with infection. We will continue with monthly therapy.  I'm going to refer him to physical therapy I believe this will help improve his balance and strength. He has a cane and I have encouraged him to use it.Marland Kitchen  He will return in one month with labs and ongoing follow-up. He has had his flu vaccination.  All questions were answered. The patient knows to call the clinic with any problems, questions or concerns. We can certainly  see the patient much sooner if necessary.  This document serves as a record of services personally performed by Ancil Linsey, MD. It was created on her behalf by Toni Amend, a trained medical scribe. The creation of this record is based on the scribe's personal observations and the provider's statements to them. This document has been checked and approved by the attending provider.  I have reviewed the above documentation for accuracy and completeness, and I agree with the above.  Molli Hazard, MD MD 08/23/2015 11:13 AM

## 2015-08-23 NOTE — Progress Notes (Signed)
Jeremy Parrish Tolerated IVIG today Discharged ambulatory

## 2015-08-23 NOTE — Patient Instructions (Signed)
..  [place] at [facility] Discharge Instructions  RECOMMENDATIONS MADE BY THE CONSULTANT AND ANY TEST RESULTS WILL BE SENT TO YOUR REFERRING PHYSICIAN.  Referral to Physical therapy for increasing balance issues and leg weakness  Return in 1 month to see Jeremy Parrish    Thank you for choosing [place] at [facility] to provide your oncology and hematology care.  To afford each patient quality time with our provider, please arrive at least 15 minutes before your scheduled appointment time.    You need to re-schedule your appointment should you arrive 10 or more minutes late.  We strive to give you quality time with our providers, and arriving late affects you and other patients whose appointments are after yours.  Also, if you no show three or more times for appointments you may be dismissed from the clinic at the providers discretion.     Again, thank you for choosing [facility].  Our hope is that these requests will decrease the amount of time that you wait before being seen by our physicians.       _____________________________________________________________  Should you have questions after your visit to [facility], please contact our office at [phone] between the hours of 8:30 a.m. and 4:30 p.m.  Voicemails left after 4:30 p.m. will not be returned until the following business day.  For prescription refill requests, have your pharmacy contact our office.

## 2015-08-24 ENCOUNTER — Ambulatory Visit (HOSPITAL_COMMUNITY): Payer: Medicare Other | Admitting: Hematology & Oncology

## 2015-08-24 ENCOUNTER — Encounter (HOSPITAL_COMMUNITY): Payer: Medicare Other

## 2015-09-01 ENCOUNTER — Other Ambulatory Visit: Payer: Self-pay | Admitting: *Deleted

## 2015-09-01 ENCOUNTER — Telehealth: Payer: Self-pay | Admitting: Family Medicine

## 2015-09-01 MED ORDER — VALACYCLOVIR HCL 1 G PO TABS: 1000.0000 mg | ORAL_TABLET | Freq: Three times a day (TID) | ORAL | Status: DC

## 2015-09-01 NOTE — Telephone Encounter (Signed)
ok 

## 2015-09-01 NOTE — Telephone Encounter (Signed)
Pt is needing a refill on the valtrex pt states his singles on his face is starting to come back.   [facility] [place]

## 2015-09-01 NOTE — Telephone Encounter (Signed)
Med sent to pharm. Pt notified.  

## 2015-09-05 ENCOUNTER — Ambulatory Visit (HOSPITAL_COMMUNITY): Payer: Medicare Other | Attending: Hematology & Oncology | Admitting: Physical Therapy

## 2015-09-05 DIAGNOSIS — R262 Difficulty in walking, not elsewhere classified: Secondary | ICD-10-CM | POA: Diagnosis not present

## 2015-09-05 DIAGNOSIS — R2681 Unsteadiness on feet: Secondary | ICD-10-CM | POA: Insufficient documentation

## 2015-09-05 DIAGNOSIS — M6281 Muscle weakness (generalized): Secondary | ICD-10-CM | POA: Diagnosis not present

## 2015-09-05 DIAGNOSIS — R6889 Other general symptoms and signs: Secondary | ICD-10-CM | POA: Insufficient documentation

## 2015-09-05 NOTE — Patient Instructions (Signed)
   HIP ABDUCTION - SIDELYING  While lying on your side, slowly raise up your top leg to the side. Keep your knee straight and maintain your toes pointed forward the entire time.   The bottom leg can be bent to stabilize your body.  Repeat 10 times each leg, twice a day.    BRIDGING  While lying on your back, tighten your lower abdominals, squeeze your buttocks and then raise your buttocks off the floor/bed as creating a "Bridge" with your body.  Repeat 10 times, twice a day.    TANDEM STANCE WITH SUPPORT  Stand in front of a chair, table or counter top for support. Then place the heel of one foot so that it is touching the toes of the other foot. Maintain your balance in this position.   Hold as long as you can, then switch your feet. Repeat 3 times each side, twice a day.

## 2015-09-05 NOTE — Therapy (Signed)
Jeremy Parrish, [place], [address] Phone: [phone]   Fax:  [phone]  Physical Therapy Evaluation  Patient Details  Name: Jeremy Parrish MRN: [id number] Date of Birth: 03/08/1941 Referring Provider: Patrici Ranks   Encounter Date: 09/05/2015      PT End of Session - 09/05/15 1110    Visit Number 1   Number of Visits 12   Date for PT Re-Evaluation 10/03/15   Authorization Type Medicare and BCBS    Authorization Time Period 09/05/15 to 11/05/15   Authorization - Visit Number 1   Authorization - Number of Visits 10   PT Start Time 9024   PT Stop Time 1101   PT Time Calculation (min) 46 min   Activity Tolerance Patient tolerated treatment well   Behavior During Therapy [facility] for tasks assessed/performed      Past Medical History  Diagnosis Date  . Hypertension   . Heart disease   . Kidney stones     history  . Lung mass   . Heart murmur   . 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 Coronary artery disease   . Depression   . Shortness of breath   . Peripheral arterial disease ([place])   . Bladder neck contracture   . Personal history of other diseases of circulatory system   . Aortic aneurysm of unspecified site without mention of rupture   . Arthritis   . Intestinovesical fistula   . Esophageal reflux   . Hyperlipidemia   . Anemia   . CHF (congestive heart failure) ([place])   . COPD (chronic obstructive pulmonary disease) ([place])   . Cerebral atherosclerosis     Carotid Doppler, 02/16/2013 - Bilateral Proximal ICAs,demonstrate mild plaque w/o evidence of significant diameter reduction, dissection, or any other vascular abnormality  . Complication of anesthesia   . PONV (postoperative nausea and vomiting)   . Stroke [facility]) 2013    Speech.  . Hx of bladder cancer 10/07/2012  . Sleep apnea     05-02-14 cpap , not yet used- suggested  settings 5  . Cancer ([place])   . Prostate cancer ([place]) 2000  . Multiple myeloma   . Non Hodgkin's lymphoma ([place])   . Myocardial infarction [facility])     '96    Past Surgical History  Procedure Laterality Date  . Prostate surgery    . Heart stents x 5  1999  . Portacath placement  07/26/2009    right chest  . Wrist surgery      right  . Left ear skin cancer removed    . Bone marrow transplant  2011  . Pacemaker insertion  07/22/2011    Medtronic  . Coronary angioplasty  06/24/2000    PCI and stenting in mid & proximal RCA  . Insert / replace / remove pacemaker    . Tee without cardioversion  10/13/2012    Procedure: TRANSESOPHAGEAL ECHOCARDIOGRAM (TEE);  Surgeon: Sanda Klein, MD;  Location: [facility] ENDOSCOPY;  Service: Cardiovascular;  Laterality: N/A;  pat/kay/echo notified  . Colonoscopy N/A 01/01/2013    Procedure: COLONOSCOPY;  Surgeon: Rogene Houston, MD;  [facility] ENDO [facility];  Service: Endoscopy;  Laterality: N/A;  825-moved to [place] notified pt  . Rotator    . Rotator cuff repair Right   . Colon surgery      colon resection  . Bladder surgery    .  Shoulder arthroscopy with subacromial decompression Right 07/21/2013    Procedure: RIGHT SHOULDER ARTHROSCOPY WITH SUBACROMIAL DECOMPRESSION AND DEBRIDEMENT & Injection of Left Shoulder;  Surgeon: Alta Corning, MD;  Location: [place];  Service: Orthopedics;  Laterality: Right;  . US echocardiography  06/19/2011    RV mildly dilated,mild to mod. MR,mild AI,mild PI  . Nm myocar perf wall motion  11/27/2007    inferior scar  . Inguinal hernia repair Right 05/04/2014    Procedure: OPEN RIGHT INGUINAL HERNIA REPAIR with mesh;  Surgeon: Edward Jolly, MD;  Location: [facility] ORS;  Service: General;  Laterality: Right;    There were no vitals filed for this visit.  Visit Diagnosis:  Difficulty walking - Plan: PT plan of care cert/re-cert  Unsteadiness - Plan: PT plan of care cert/re-cert  Muscle weakness - Plan: PT plan of care  cert/re-cert  Decreased functional activity tolerance - Plan: PT plan of care cert/re-cert      Subjective Assessment - 09/05/15 1019    Subjective Patient reports that he does feel tired and is concerned if it is a medicine he is taking that might be dragging him down. He reports that both of his shoulders still hurt.    Pertinent History Patient had a stroke in 2011-2012; he did receive therapy after this stroke, including PT and Speech. No falls recently, no real close calls. He does have a pacemaker and history of an aortic anuerysm. He states that he feels like there might be something different going on with his body.    Currently in Pain? No/denies            [name] PT Assessment - 09/05/15 0001    Assessment   Medical Diagnosis cva, balance issues, leg weakness    Referring Provider Kelby Fam [place]    Onset Date/Surgical Date --  chronic    Next MD Visit December with Dr. Whitney Muse    Precautions   Precautions Fall;ICD/Pacemaker;Other (comment)   Precaution Comments aortic aneurysm, expressive aphasia    Restrictions   Weight Bearing Restrictions No   Balance Screen   Has the patient fallen in the past 6 months No   Has the patient had a decrease in activity level because of a fear of falling?  Yes   Is the patient reluctant to leave their home because of a fear of falling?  Yes   Prior Function   Level of Independence Independent;Independent with basic ADLs;Independent with gait;Independent with transfers   Vocation Retired   Leisure outside work like gardening    Observation/Other Assessments   Focus on Therapeutic Outcomes (FOTO)  40% limited    Posture/Postural Control   Posture Comments flexed athips, forward head with bilateral IR shoulders, reduced spinal curves, varus knees, bilateral foot pronation    AROM   Right Ankle Dorsiflexion 9   Left Ankle Dorsiflexion 8   Strength   Right Hip Flexion 5/5   Right Hip Extension 4-/5   Right Hip ABduction 4-/5    Left Hip Flexion 5/5   Left Hip Extension 3+/5   Left Hip ABduction 3+/5   Right Knee Flexion 4+/5   Right Knee Extension 4+/5   Left Knee Flexion 4/5   Left Knee Extension 4+/5   Right Ankle Dorsiflexion 4+/5   Left Ankle Dorsiflexion 4+/5   Ambulation/Gait   Gait Comments Fatigue with extended gait, varus knees, reduced rotation of trunk and pelvis, slight flexion at hips   6 minute walk test results  Endurance additional comments 1064f in 6MWT, 0.942m   Berg Balance Test   Sit to Stand Able to stand without using hands and stabilize independently   Standing Unsupported Able to stand safely 2 minutes   Sitting with Back Unsupported but Feet Supported on Floor or Stool Able to sit safely and securely 2 minutes   Stand to Sit Sits safely with minimal use of hands   Transfers Able to transfer safely, minor use of hands   Standing Unsupported with Eyes Closed Able to stand 10 seconds with supervision   Standing Ubsupported with Feet Together Able to place feet together independently and stand 1 minute safely   From Standing, Reach Forward with Outstretched Arm Can reach forward >12 cm safely (5")   From Standing Position, Pick up Object from [place] pick up shoe safely and easily   From Standing Position, Turn to Look Behind Over each Shoulder Looks behind from both sides and weight shifts well   Turn 360 Degrees Able to turn 360 degrees safely in 4 seconds or less   Standing Unsupported, Alternately Place Feet on Step/Stool Able to stand independently and safely and complete 8 steps in 20 seconds   Standing Unsupported, One Foot in Front Able to take small step independently and hold 30 seconds   Standing on One Leg Tries to lift leg/unable to hold 3 seconds but remains standing independently   Total Score 49                           PT Education - 09/05/15 1110    Education provided Yes   Education Details HEP, prognosis, plan of care    Person(s)  Educated Patient   Methods Explanation;Demonstration;Handout   Comprehension Verbalized understanding          PT Short Term Goals - 09/05/15 1131    PT SHORT TERM GOAL #1   Title Patient will improve 6 minute walk to at least 120047fith no rest breaks and no noticeable decrease in speed/increase in fatigue towards latter part of gait distance    Time 3   Period Weeks   Status New   PT SHORT TERM GOAL #2   Title Patient to score at least 52 on BERG balance test to demonstrate reduced fall risk during functional activities    Time 3   Period Weeks   Status New   PT SHORT TERM GOAL #3   Title Patient will be independent in correctly and consistently performing appropriate HEP, to be updated PRN    Time 3   Period Weeks   Status New           PT Long Term Goals - 09/05/15 1151    PT LONG TERM GOAL #1   Title Patient will demonstrate 5/5 strength in all muslces tested in order to promote enhanced balance and functional endurance with reduced fall risk    Time 6   Period Weeks   Status New   PT LONG TERM GOAL #2   Title Patient will score at least a 54 on the BERG balance test in order to demonstrate significantly reduced fall risk during all functional situations    Time 6   Period Weeks   Status New   PT LONG TERM GOAL #3   Title Patient will be able to ambulate at least 1400f33friung the 6 minute walk test in order to demonstrate improved functional actvity tolerance for improved  performance in functional situations    Time 6   Period Weeks   Status New   PT LONG TERM GOAL #4   Title Patient to be performing at least 20 minutes of light-moderate intensity exercise at least 3 days per week in [place] to maintain functional gains and promote overall improved healthy lifestyle    Time 6   Period Weeks   Status New               Plan - 10/04/15 1113    Clinical Impression Statement Patient presents with lower extremity and proximal muscle weakness, balance  impairment as evidenced by reduced score on BERG, and reduced functional activity tolerance as evidenced by reduced gait distance and increased fatigue during 6 minute walk test. The patient also complains of pain inhis shoulders as well as difficulty in raising UEs overhead and general weakness in his arms. He also expresses fear of falling and is a moderate fall risk per BERG balance scale. At this time he will benefit from skilled PT services to improve his overall level of function and reduce overall fall risk.      Pt will benefit from skilled therapeutic intervention in order to improve on the following deficits Abnormal gait;Hypomobility;Cardiopulmonary status limiting activity;Decreased activity tolerance;Decreased strength;Impaired UE functional use;Decreased balance;Decreased mobility;Difficulty walking;Improper body mechanics;Decreased coordination;Impaired flexibility;Postural dysfunction   Rehab Potential Good   PT Frequency 2x / week   PT Duration 6 weeks   PT Treatment/Interventions ADLs/Self Care Home Management;Gait training;Therapeutic activities;Therapeutic exercise;Balance training;Neuromuscular re-education;Patient/family education;Manual techniques;Energy conservation   PT Next Visit Plan review HEP and goals; functional strengthening, gait and balance training, endurance training as tolerated. Follow up on OT referral    PT Home Exercise Plan given    Recommended Other Services OT referral    Consulted and Agree with Plan of Care Patient          G-Codes - 10/04/2015 1155    Functional Assessment Tool Used Based on skilled clinical assessment of gait, balance, functional activity tolerance, strength    Functional Limitation Mobility: Walking and moving around   Mobility: Walking and Moving Around Current Status (U2725) At least 40 percent but less than 60 percent impaired, limited or restricted   Mobility: Walking and Moving Around Goal Status [phone]) At least 20 percent but  less than 40 percent impaired, limited or restricted       Problem List Patient Active Problem List   Diagnosis Date Noted  . Hypokalemia 11/03/2014  . URI (upper respiratory infection) 11/03/2014  . Weakness 11/03/2014  . Obstructive sleep apnea 08/07/2014  . Central sleep apnea 08/07/2014  . Inguinal hernia 05/04/2014  . Chest pain with moderate risk for cardiac etiology 11/25/2013  . Fever 09/13/2013  . Pneumonia 09/13/2013  . Pancytopenia ([place]) 09/13/2013  . Muscle weakness (generalized) 08/04/2013  . Status post arthroscopy of shoulder 08/04/2013  . Pain in joint, shoulder region 08/04/2013  . Rotator cuff tear arthropathy of right shoulder 07/21/2013  . Left rotator cuff tear arthropathy 07/21/2013  . Shortness of breath 07/07/2013  . 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 Hyperlipidemia with target LDL less than 100 10/08/2012  . Prediabetes 10/08/2012  . Expressive aphasia 10/08/2012  . Hemiplegia affecting right dominant side ([place]) 10/08/2012  . 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 Hx of bladder cancer 10/07/2012  . 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  ASTHMA, UNSPECIFIED 03/22/2010  . Nonspecific (abnormal) findings on radiological and other examination of body structure 03/22/2010  . Hx of lymphoma 03/21/2010  . Multiple myeloma in remission ([place]) 03/21/2010  . Anxiety state 03/21/2010  . Essential hypertension 03/21/2010  . MYOCARDIAL INFARCTION 03/21/2010  . Coronary atherosclerosis 03/21/2010  . NEPHROLITHIASIS 03/21/2010  . ELEVATED PROSTATE SPECIFIC ANTIGEN 03/21/2010  . ROTATOR CUFF REPAIR, RIGHT, HX OF 03/21/2010    Deniece Ree PT, DPT [place] [address] [place], [place], [address] Phone: [phone]   Fax:  [phone]  Name: Jeremy Parrish MRN: [id number] Date of Birth: 1941/05/25

## 2015-09-08 ENCOUNTER — Other Ambulatory Visit (HOSPITAL_COMMUNITY): Payer: Self-pay | Admitting: Oncology

## 2015-09-08 ENCOUNTER — Ambulatory Visit (HOSPITAL_COMMUNITY): Payer: Medicare Other | Admitting: Physical Therapy

## 2015-09-08 DIAGNOSIS — C9001 Multiple myeloma in remission: Secondary | ICD-10-CM

## 2015-09-08 DIAGNOSIS — R2681 Unsteadiness on feet: Secondary | ICD-10-CM

## 2015-09-08 DIAGNOSIS — M6281 Muscle weakness (generalized): Secondary | ICD-10-CM | POA: Diagnosis not present

## 2015-09-08 DIAGNOSIS — R262 Difficulty in walking, not elsewhere classified: Secondary | ICD-10-CM | POA: Diagnosis not present

## 2015-09-08 DIAGNOSIS — R6889 Other general symptoms and signs: Secondary | ICD-10-CM | POA: Diagnosis not present

## 2015-09-08 MED ORDER — LENALIDOMIDE 10 MG PO CAPS: 10.0000 mg | ORAL_CAPSULE | Freq: Every day | ORAL | Status: DC

## 2015-09-08 NOTE — Therapy (Signed)
[place], [place], [address] Phone: [phone]   Fax:  [phone]  Physical Therapy Treatment  Patient Details  Name: Jeremy Parrish MRN: [id number] Date of Birth: 05/24/1941 Referring Provider: Patrici Ranks   Encounter Date: 09/08/2015      PT End of Session - 09/08/15 1515    Visit Number 2   Number of Visits 12   Date for PT Re-Evaluation 10/03/15   Authorization Type Medicare and BCBS    Authorization Time Period 09/05/15 to 11/05/15   Authorization - Visit Number 2   Authorization - Number of Visits 10   PT Start Time 1430   PT Stop Time 1519   PT Time Calculation (min) 49 min   Activity Tolerance Patient tolerated treatment well   Behavior During Therapy [facility] for tasks assessed/performed      Past Medical History  Diagnosis Date  . Hypertension   . Heart disease   . Kidney stones     history  . Lung mass   . Heart murmur   . 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 Coronary artery disease   . Depression   . Shortness of breath   . Peripheral arterial disease ([place])   . Bladder neck contracture   . Personal history of other diseases of circulatory system   . Aortic aneurysm of unspecified site without mention of rupture   . Arthritis   . Intestinovesical fistula   . Esophageal reflux   . Hyperlipidemia   . Anemia   . CHF (congestive heart failure) ([place])   . COPD (chronic obstructive pulmonary disease) ([place])   . Cerebral atherosclerosis     Carotid Doppler, 02/16/2013 - Bilateral Proximal ICAs,demonstrate mild plaque w/o evidence of significant diameter reduction, dissection, or any other vascular abnormality  . Complication of anesthesia   . PONV (postoperative nausea and vomiting)   . Stroke [facility]) 2013    Speech.  . Hx of bladder cancer 10/07/2012  . Sleep apnea     05-02-14 cpap , not yet used- suggested  settings 5  . Cancer ([place])   . Prostate cancer ([place]) 2000  . Multiple myeloma   . Non Hodgkin's lymphoma ([place])   . Myocardial infarction [facility])     '96    Past Surgical History  Procedure Laterality Date  . Prostate surgery    . Heart stents x 5  1999  . Portacath placement  07/26/2009    right chest  . Wrist surgery      right  . Left ear skin cancer removed    . Bone marrow transplant  2011  . Pacemaker insertion  07/22/2011    Medtronic  . Coronary angioplasty  06/24/2000    PCI and stenting in mid & proximal RCA  . Insert / replace / remove pacemaker    . Tee without cardioversion  10/13/2012    Procedure: TRANSESOPHAGEAL ECHOCARDIOGRAM (TEE);  Surgeon: Sanda Klein, MD;  Location: [facility] ENDOSCOPY;  Service: Cardiovascular;  Laterality: N/A;  pat/kay/echo notified  . Colonoscopy N/A 01/01/2013    Procedure: COLONOSCOPY;  Surgeon: Rogene Houston, MD;  [facility] ENDO [facility];  Service: Endoscopy;  Laterality: N/A;  825-moved to [place] notified pt  . Rotator    . Rotator cuff repair Right   . Colon surgery      colon resection  . Bladder surgery    .  Shoulder arthroscopy with subacromial decompression Right 07/21/2013    Procedure: RIGHT SHOULDER ARTHROSCOPY WITH SUBACROMIAL DECOMPRESSION AND DEBRIDEMENT & Injection of Left Shoulder;  Surgeon: Alta Corning, MD;  Location: [place];  Service: Orthopedics;  Laterality: Right;  . US echocardiography  06/19/2011    RV mildly dilated,mild to mod. MR,mild AI,mild PI  . Nm myocar perf wall motion  11/27/2007    inferior scar  . Inguinal hernia repair Right 05/04/2014    Procedure: OPEN RIGHT INGUINAL HERNIA REPAIR with mesh;  Surgeon: Edward Jolly, MD;  [facility] ORS;  Service: General;  Laterality: Right;    There were no vitals filed for this visit.  Visit Diagnosis:  Difficulty walking  Unsteadiness  Muscle weakness  Decreased functional activity tolerance      Subjective Assessment - 09/08/15 1436    Subjective Pt  reports that he feels pretty good today, but he feels like he doesn't have much energy and he feels "washed out."   Currently in Pain? No/denies   Pain Score 0-No pain                         [facility] Adult PT Treatment/Exercise - 09/08/15 0001    Exercises   Exercises Knee/Hip   Knee/Hip Exercises: Aerobic   Nustep hills 3 level 2 x 8'   Knee/Hip Exercises: Standing   Heel Raises 15 reps   Heel Raises Limitations heel and toe raises   Hip Abduction Both;15 reps   Rocker Board 2 minutes   Rocker Board Limitations R/L and A/P   Knee/Hip Exercises: Seated   Long Arc Quad Both;15 reps   Long Arc Quad Weight 3 lbs.   Sit to Sand 10 reps;without UE support   Knee/Hip Exercises: Supine   Bridges 10 reps   Straight Leg Raises 10 reps;Both   Knee/Hip Exercises: Sidelying   Hip ABduction Both;10 reps   Knee/Hip Exercises: Prone   Straight Leg Raises Both;10 reps             Balance Exercises - 09/08/15 1507    Balance Exercises: Standing   Tandem Stance 4 reps;30 secs   SLS 2 reps;15 secs;Eyes open;Intermittent upper extremity support   Standing, One Foot on a Step 6 inch;Foam/compliant surface;2 reps;30 secs           PT Education - 09/08/15 1515    Education provided Yes   Education Details HEP and goals reviewed   Person(s) Educated Patient   Methods Explanation   Comprehension Verbalized understanding          PT Short Term Goals - 09/05/15 1131    PT SHORT TERM GOAL #1   Title Patient will improve 6 minute walk to at least 1226ft with no rest breaks and no noticeable decrease in speed/increase in fatigue towards latter part of gait distance    Time 3   Period Weeks   Status New   PT SHORT TERM GOAL #2   Title Patient to score at least 52 on BERG balance test to demonstrate reduced fall risk during functional activities    Time 3   Period Weeks   Status New   PT SHORT TERM GOAL #3   Title Patient will be independent in correctly and  consistently performing appropriate HEP, to be updated PRN    Time 3   Period Weeks   Status New           PT Long Term  Goals - 09/05/15 1151    PT LONG TERM GOAL #1   Title Patient will demonstrate 5/5 strength in all muslces tested in order to promote enhanced balance and functional endurance with reduced fall risk    Time 6   Period Weeks   Status New   PT LONG TERM GOAL #2   Title Patient will score at least a 54 on the BERG balance test in order to demonstrate significantly reduced fall risk during all functional situations    Time 6   Period Weeks   Status New   PT LONG TERM GOAL #3   Title Patient will be able to ambulate at least 1434ft duriung the 6 minute walk test in order to demonstrate improved functional actvity tolerance for improved performance in functional situations    Time 6   Period Weeks   Status New   PT LONG TERM GOAL #4   Title Patient to be performing at least 20 minutes of light-moderate intensity exercise at least 3 days per week in [place] to maintain functional gains and promote overall improved healthy lifestyle    Time 6   Period Weeks   Status New               Plan - 09/08/15 1522    Clinical Impression Statement Treatment session focused on functional strengthening and balance training today. Pt required verbal and tactile cueing for proper form during standing hip abduction and extension to avoid compensation. Pt was able to complete all balance activities with no UE support except for SLS, which required intermittent UE support and CGA from PT. Nustep was added at end of treatment to improve functional activity tolerance. Pt denied any pain post treatment.    PT Next Visit Plan Continue with functional strengthening and balance training        Problem List Patient Active Problem List   Diagnosis Date Noted  . Hypokalemia 11/03/2014  . URI (upper respiratory infection) 11/03/2014  . Weakness 11/03/2014  . Obstructive sleep apnea  08/07/2014  . Central sleep apnea 08/07/2014  . Inguinal hernia 05/04/2014  . Chest pain with moderate risk for cardiac etiology 11/25/2013  . Fever 09/13/2013  . Pneumonia 09/13/2013  . Pancytopenia ([place]) 09/13/2013  . Muscle weakness (generalized) 08/04/2013  . Status post arthroscopy of shoulder 08/04/2013  . Pain in joint, shoulder region 08/04/2013  . Rotator cuff tear arthropathy of right shoulder 07/21/2013  . Left rotator cuff tear arthropathy 07/21/2013  . Shortness of breath 07/07/2013  . 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 Hyperlipidemia with target LDL less than 100 10/08/2012  . Prediabetes 10/08/2012  . Expressive aphasia 10/08/2012  . Hemiplegia affecting right dominant side ([place]) 10/08/2012  . 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 Hx of bladder cancer 10/07/2012  . 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 ASTHMA, UNSPECIFIED 03/22/2010  . Nonspecific (abnormal) findings on radiological and other examination of body structure 03/22/2010  . Hx of lymphoma 03/21/2010  . Multiple myeloma in remission ([place]) 03/21/2010  . Anxiety state 03/21/2010  . Essential hypertension 03/21/2010  . MYOCARDIAL INFARCTION 03/21/2010  . Coronary atherosclerosis 03/21/2010  . NEPHROLITHIASIS 03/21/2010  . ELEVATED PROSTATE SPECIFIC ANTIGEN 03/21/2010  . ROTATOR CUFF REPAIR, RIGHT, HX OF 03/21/2010    Hilma Favors, PT, DPT [phone] 09/08/2015, 3:25 PM  [place]  [place], [place], [address] Phone: [phone]   Fax:  [phone]  Name: Jeremy Parrish MRN: [id number] Date of Birth: 12-21-1940

## 2015-09-12 ENCOUNTER — Other Ambulatory Visit (HOSPITAL_COMMUNITY): Payer: Self-pay

## 2015-09-12 ENCOUNTER — Ambulatory Visit (HOSPITAL_COMMUNITY): Payer: Medicare Other | Attending: Hematology & Oncology | Admitting: Physical Therapy

## 2015-09-12 DIAGNOSIS — M25612 Stiffness of left shoulder, not elsewhere classified: Secondary | ICD-10-CM | POA: Insufficient documentation

## 2015-09-12 DIAGNOSIS — R2681 Unsteadiness on feet: Secondary | ICD-10-CM | POA: Insufficient documentation

## 2015-09-12 DIAGNOSIS — R6889 Other general symptoms and signs: Secondary | ICD-10-CM | POA: Diagnosis not present

## 2015-09-12 DIAGNOSIS — M6281 Muscle weakness (generalized): Secondary | ICD-10-CM | POA: Insufficient documentation

## 2015-09-12 DIAGNOSIS — M629 Disorder of muscle, unspecified: Secondary | ICD-10-CM | POA: Insufficient documentation

## 2015-09-12 DIAGNOSIS — R262 Difficulty in walking, not elsewhere classified: Secondary | ICD-10-CM | POA: Diagnosis not present

## 2015-09-12 DIAGNOSIS — M25511 Pain in right shoulder: Secondary | ICD-10-CM | POA: Diagnosis not present

## 2015-09-12 DIAGNOSIS — M25611 Stiffness of right shoulder, not elsewhere classified: Secondary | ICD-10-CM | POA: Diagnosis not present

## 2015-09-12 DIAGNOSIS — M25512 Pain in left shoulder: Secondary | ICD-10-CM | POA: Insufficient documentation

## 2015-09-12 NOTE — Therapy (Signed)
[place], [place], [address] Phone: [phone]   Fax:  [phone]  Physical Therapy Treatment  Patient Details  Name: Jeremy Parrish MRN: [id number] Date of Birth: [birth year] Referring Provider: Patrici Ranks   Encounter Date: 09/12/2015      PT End of Session - 09/12/15 1556    Visit Number 3   Number of Visits 12   Date for PT Re-Evaluation 10/03/15   Authorization Type Medicare and BCBS    Authorization Time Period 09/05/15 to 11/05/15   Authorization - Visit Number 3   Authorization - Number of Visits 10   PT Start Time 6834   PT Stop Time 1602   PT Time Calculation (min) 47 min   Activity Tolerance Patient tolerated treatment well   Behavior During Therapy [facility] for tasks assessed/performed      Past Medical History  Diagnosis Date  . Hypertension   . Heart disease   . Kidney stones     history  . Lung mass   . Heart murmur   . 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 Coronary artery disease   . Depression   . Shortness of breath   . Peripheral arterial disease ([place])   . Bladder neck contracture   . Personal history of other diseases of circulatory system   . Aortic aneurysm of unspecified site without mention of rupture   . Arthritis   . Intestinovesical fistula   . Esophageal reflux   . Hyperlipidemia   . Anemia   . CHF (congestive heart failure) ([place])   . COPD (chronic obstructive pulmonary disease) ([place])   . Cerebral atherosclerosis     Carotid Doppler, 02/16/2013 - Bilateral Proximal ICAs,demonstrate mild plaque w/o evidence of significant diameter reduction, dissection, or any other vascular abnormality  . Complication of anesthesia   . PONV (postoperative nausea and vomiting)   . Stroke [facility]) 2013    Speech.  . Hx of bladder cancer 10/07/2012  . Sleep apnea     05-02-14 cpap , not yet used- suggested  settings 5  . Cancer ([place])   . Prostate cancer ([place]) 2000  . Multiple myeloma   . Non Hodgkin's lymphoma ([place])   . Myocardial infarction [facility])     '96    Past Surgical History  Procedure Laterality Date  . Prostate surgery    . Heart stents x 5  1999  . Portacath placement  07/26/2009    right chest  . Wrist surgery      right  . Left ear skin cancer removed    . Bone marrow transplant  2011  . Pacemaker insertion  07/22/2011    Medtronic  . Coronary angioplasty  06/24/2000    PCI and stenting in mid & proximal RCA  . Insert / replace / remove pacemaker    . Tee without cardioversion  10/13/2012    Procedure: TRANSESOPHAGEAL ECHOCARDIOGRAM (TEE);  Surgeon: Sanda Klein, MD;  Location: [facility] ENDOSCOPY;  Service: Cardiovascular;  Laterality: N/A;  pat/kay/echo notified  . Colonoscopy N/A 01/01/2013    Procedure: COLONOSCOPY;  Surgeon: Rogene Houston, MD;  [facility]O [facility];  Service: Endoscopy;  Laterality: N/A;  825-moved to [place] notified pt  . Rotator    . Rotator cuff repair Right   . Colon surgery      colon resection  . Bladder surgery    .  Shoulder arthroscopy with subacromial decompression Right 07/21/2013    Procedure: RIGHT SHOULDER ARTHROSCOPY WITH SUBACROMIAL DECOMPRESSION AND DEBRIDEMENT & Injection of Left Shoulder;  Surgeon: Alta Corning, MD;  Location: [place];  Service: Orthopedics;  Laterality: Right;  . US echocardiography  06/19/2011    RV mildly dilated,mild to mod. MR,mild AI,mild PI  . Nm myocar perf wall motion  11/27/2007    inferior scar  . Inguinal hernia repair Right 05/04/2014    Procedure: OPEN RIGHT INGUINAL HERNIA REPAIR with mesh;  Surgeon: Edward Jolly, MD;  [facility] ORS;  Service: General;  Laterality: Right;    There were no vitals filed for this visit.  Visit Diagnosis:  Difficulty walking  Unsteadiness  Muscle weakness  Decreased functional activity tolerance      Subjective Assessment - 09/12/15 1523    Subjective Pt  reports that he feels good today, he tried to do his exercises last night but had a lot of leg cramping.    Currently in Pain? No/denies   Pain Score 0-No pain                [facility] Adult PT Treatment/Exercise - 09/12/15 0001    Knee/Hip Exercises: Aerobic   Nustep hills 3 level 3 x 8'   Knee/Hip Exercises: Standing   Hip Abduction Both;15 reps   Rocker Board 2 minutes   Rocker Board Limitations R/L and A/P   Knee/Hip Exercises: Seated   Long Arc Quad Both;15 reps   Long Arc Quad Weight 4 lbs.   Sit to Sand 15 reps;without UE support   Knee/Hip Exercises: Supine   Bridges 2 sets;15 reps   Straight Leg Raises 15 reps;Both   Knee/Hip Exercises: Sidelying   Hip ABduction Both;15 reps   Knee/Hip Exercises: Prone   Straight Leg Raises Both;10 reps             Balance Exercises - 09/12/15 1600    Balance Exercises: Standing   Tandem Stance 3 reps;30 secs   SLS 2 reps;15 secs;Intermittent upper extremity support   Standing, One Foot on a Step 8 inch;Foam/compliant surface;2 reps;30 secs   Step Ups --  tap ups at 8" step standing on foam             PT Short Term Goals - 09/05/15 1131    PT SHORT TERM GOAL #1   Title Patient will improve 6 minute walk to at least 1268f with no rest breaks and no noticeable decrease in speed/increase in fatigue towards latter part of gait distance    Time 3   Period Weeks   Status New   PT SHORT TERM GOAL #2   Title Patient to score at least 52 on BERG balance test to demonstrate reduced fall risk during functional activities    Time 3   Period Weeks   Status New   PT SHORT TERM GOAL #3   Title Patient will be independent in correctly and consistently performing appropriate HEP, to be updated PRN    Time 3   Period Weeks   Status New           PT Long Term Goals - 09/05/15 1151    PT LONG TERM GOAL #1   Title Patient will demonstrate 5/5 strength in all muslces tested in order to promote enhanced balance and  functional endurance with reduced fall risk    Time 6   Period Weeks   Status New   PT LONG TERM GOAL #  2   Title Patient will score at least a 54 on the BERG balance test in order to demonstrate significantly reduced fall risk during all functional situations    Time 6   Period Weeks   Status New   PT LONG TERM GOAL #3   Title Patient will be able to ambulate at least 1446f duriung the 6 minute walk test in order to demonstrate improved functional actvity tolerance for improved performance in functional situations    Time 6   Period Weeks   Status New   PT LONG TERM GOAL #4   Title Patient to be performing at least 20 minutes of light-moderate intensity exercise at least 3 days Jeremy week in [place] to maintain functional gains and promote overall improved healthy lifestyle    Time 6   Period Weeks   Status New               Plan - 09/12/15 1557    Clinical Impression Statement Added tap ups on foam today to challenge standing balance, pt required verbal cueing for proper form and intermittent UE support, however, was able to complete without LOB. Progressed to 4 pounds with LAQ to improve quad strength and improve functional mobility, pt reported fatigue after therex, but denied any pain.    PT Next Visit Plan Continue with functional strengthening and balance training        Problem List Patient Active Problem List   Diagnosis Date Noted  . Hypokalemia 11/03/2014  . URI (upper respiratory infection) 11/03/2014  . Weakness 11/03/2014  . Obstructive sleep apnea 08/07/2014  . Central sleep apnea 08/07/2014  . Inguinal hernia 05/04/2014  . Chest pain with moderate risk for cardiac etiology 11/25/2013  . Fever 09/13/2013  . Pneumonia 09/13/2013  . Pancytopenia ([place] 09/13/2013  . Muscle weakness (generalized) 08/04/2013  . Status post arthroscopy of shoulder 08/04/2013  . Pain in joint, shoulder region 08/04/2013  . Rotator cuff tear arthropathy of right shoulder  07/21/2013  . Left rotator cuff tear arthropathy 07/21/2013  . Shortness of breath 07/07/2013  . Aneurysm of iliac artery ([place] 01/26/2013  . Hyperlipidemia with target LDL less than 100 10/08/2012  . Prediabetes 10/08/2012  . Expressive aphasia 10/08/2012  . Hemiplegia affecting right dominant side ([place] 10/08/2012  . 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 Hx of bladder cancer 10/07/2012  . 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 Hypogammaglobulinemia ([place] 09/28/2012  . ASTHMA, UNSPECIFIED 03/22/2010  . Nonspecific (abnormal) findings on radiological and other examination of body structure 03/22/2010  . Hx of lymphoma 03/21/2010  . Multiple myeloma in remission ([place] 03/21/2010  . Anxiety state 03/21/2010  . Essential hypertension 03/21/2010  . MYOCARDIAL INFARCTION 03/21/2010  . Coronary atherosclerosis 03/21/2010  . NEPHROLITHIASIS 03/21/2010  . ELEVATED PROSTATE SPECIFIC ANTIGEN 03/21/2010  . ROTATOR CUFF REPAIR, RIGHT, HX OF 03/21/2010    LHilma Favors PT, DPT [id number]/11/2014, 4:00 PM  [id number]SNels[address] [place] [address]Phone: [phone]  Fax:  [phone] Name: RLAMARCO GUDIELMRN: [id number] of Birth: [birth year]

## 2015-09-14 ENCOUNTER — Ambulatory Visit (HOSPITAL_COMMUNITY): Payer: Medicare Other

## 2015-09-14 ENCOUNTER — Encounter (HOSPITAL_COMMUNITY): Payer: Medicare Other | Attending: Oncology

## 2015-09-14 DIAGNOSIS — C9001 Multiple myeloma in remission: Secondary | ICD-10-CM | POA: Insufficient documentation

## 2015-09-14 DIAGNOSIS — R262 Difficulty in walking, not elsewhere classified: Secondary | ICD-10-CM | POA: Diagnosis not present

## 2015-09-14 DIAGNOSIS — R6889 Other general symptoms and signs: Secondary | ICD-10-CM | POA: Diagnosis not present

## 2015-09-14 DIAGNOSIS — R2681 Unsteadiness on feet: Secondary | ICD-10-CM | POA: Diagnosis not present

## 2015-09-14 DIAGNOSIS — M25511 Pain in right shoulder: Secondary | ICD-10-CM | POA: Diagnosis not present

## 2015-09-14 DIAGNOSIS — M25512 Pain in left shoulder: Secondary | ICD-10-CM | POA: Diagnosis not present

## 2015-09-14 DIAGNOSIS — C9000 Multiple myeloma not having achieved remission: Secondary | ICD-10-CM | POA: Insufficient documentation

## 2015-09-14 DIAGNOSIS — D801 Nonfamilial hypogammaglobulinemia: Secondary | ICD-10-CM

## 2015-09-14 DIAGNOSIS — M6281 Muscle weakness (generalized): Secondary | ICD-10-CM | POA: Diagnosis not present

## 2015-09-14 DIAGNOSIS — Z8572 Personal history of non-Hodgkin lymphomas: Secondary | ICD-10-CM

## 2015-09-14 LAB — COMPREHENSIVE METABOLIC PANEL
ALBUMIN: 3.7 g/dL (ref 3.5–5.0)
ALK PHOS: 63 U/L (ref 38–126)
ALT: 19 U/L (ref 17–63)
ANION GAP: 7 (ref 5–15)
AST: 24 U/L (ref 15–41)
BILIRUBIN TOTAL: 0.5 mg/dL (ref 0.3–1.2)
BUN: 9 mg/dL (ref 6–20)
CALCIUM: 8.8 mg/dL — AB (ref 8.9–10.3)
CO2: 24 mmol/L (ref 22–32)
Chloride: 107 mmol/L (ref 101–111)
Creatinine, Ser: 0.83 mg/dL (ref 0.61–1.24)
GFR calc Af Amer: >60 mL/min (ref >60)
GLUCOSE: 122 mg/dL — AB (ref 65–99)
POTASSIUM: 3.4 mmol/L — AB (ref 3.5–5.1)
Sodium: 138 mmol/L (ref 135–145)
TOTAL PROTEIN: 6.6 g/dL (ref 6.5–8.1)

## 2015-09-14 LAB — CBC WITH DIFFERENTIAL/PLATELET
BASOS PCT: 2 %
Basophils Absolute: 0.1 10*3/uL (ref 0.0–0.1)
Eosinophils Absolute: 0.1 10*3/uL (ref 0.0–0.7)
Eosinophils Relative: 2 %
HEMATOCRIT: 37.2 % — AB (ref 39.0–52.0)
HEMOGLOBIN: 12.3 g/dL — AB (ref 13.0–17.0)
LYMPHS ABS: 1.3 10*3/uL (ref 0.7–4.0)
LYMPHS PCT: 35 %
MCH: 29.5 pg (ref 26.0–34.0)
MCHC: 33.1 g/dL (ref 30.0–36.0)
MCV: 89.2 fL (ref 78.0–100.0)
MONO ABS: 0.5 10*3/uL (ref 0.1–1.0)
MONOS PCT: 13 %
NEUTROS ABS: 1.8 10*3/uL (ref 1.7–7.7)
NEUTROS PCT: 48 %
Platelets: 142 10*3/uL — ABNORMAL LOW (ref 150–400)
RBC: 4.17 MIL/uL — ABNORMAL LOW (ref 4.22–5.81)
RDW: 14.9 % (ref 11.5–15.5)
WBC: 3.8 10*3/uL — ABNORMAL LOW (ref 4.0–10.5)

## 2015-09-14 LAB — C-REACTIVE PROTEIN

## 2015-09-14 LAB — SEDIMENTATION RATE: Sed Rate: 8 mm/hr (ref 0–16)

## 2015-09-14 LAB — LACTATE DEHYDROGENASE: LDH: 147 U/L (ref 98–192)

## 2015-09-14 NOTE — Therapy (Signed)
[place], [place], [address] Phone: [phone]   Fax:  [phone]  Physical Therapy Treatment  Patient Details  Name: Jeremy Parrish MRN: [id number] Date of Birth: August 08, 1941 Referring Provider: Patrici Ranks   Encounter Date: 09/14/2015      Jeremy Parrish End of Session - 09/14/15 1149    Visit Number 4   Number of Visits 12   Date for Jeremy Parrish Re-Evaluation 10/03/15   Authorization Type Medicare and BCBS    Authorization Time Period 09/05/15 to 11/05/15   Authorization - Visit Number 4   Authorization - Number of Visits 10   Jeremy Parrish Start Time 6967   Jeremy Parrish Stop Time 1143   Jeremy Parrish Time Calculation (min) 38 min   Equipment Utilized During Treatment Gait belt   Activity Tolerance Patient tolerated treatment well   Behavior During Therapy [facility] for tasks assessed/performed      Past Medical History  Diagnosis Date  . Hypertension   . Heart disease   . Kidney stones     history  . Lung mass   . Heart murmur   . 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 Coronary artery disease   . Depression   . Shortness of breath   . Peripheral arterial disease ([place])   . Bladder neck contracture   . Personal history of other diseases of circulatory system   . Aortic aneurysm of unspecified site without mention of rupture   . Arthritis   . Intestinovesical fistula   . Esophageal reflux   . Hyperlipidemia   . Anemia   . CHF (congestive heart failure) ([place])   . COPD (chronic obstructive pulmonary disease) ([place])   . Cerebral atherosclerosis     Carotid Doppler, 02/16/2013 - Bilateral Proximal ICAs,demonstrate mild plaque w/o evidence of significant diameter reduction, dissection, or any other vascular abnormality  . Complication of anesthesia   . PONV (postoperative nausea and vomiting)   . Stroke [facility]) 2013    Speech.  . Hx of bladder cancer 10/07/2012  . Sleep apnea      05-02-14 cpap , not yet used- suggested settings 5  . Cancer ([place])   . Prostate cancer ([place]) 2000  . Multiple myeloma   . Non Hodgkin's lymphoma ([place])   . Myocardial infarction [facility])     '96    Past Surgical History  Procedure Laterality Date  . Prostate surgery    . Heart stents x 5  1999  . Portacath placement  07/26/2009    right chest  . Wrist surgery      right  . Left ear skin cancer removed    . Bone marrow transplant  2011  . Pacemaker insertion  07/22/2011    Medtronic  . Coronary angioplasty  06/24/2000    PCI and stenting in mid & proximal RCA  . Insert / replace / remove pacemaker    . Tee without cardioversion  10/13/2012    Procedure: TRANSESOPHAGEAL ECHOCARDIOGRAM (TEE);  Surgeon: Sanda Klein, MD;  Location: [facility] ENDOSCOPY;  Service: Cardiovascular;  Laterality: N/A;  pat/kay/echo notified  . Colonoscopy N/A 01/01/2013    Procedure: COLONOSCOPY;  Surgeon: Rogene Houston, MD;  [facility];  Service: Endoscopy;  Laterality: N/A;  825-moved to [place] notified Jeremy Parrish  . Rotator    . Rotator cuff repair Right   . Colon surgery      colon resection  .  Bladder surgery    . Shoulder arthroscopy with subacromial decompression Right 07/21/2013    Procedure: RIGHT SHOULDER ARTHROSCOPY WITH SUBACROMIAL DECOMPRESSION AND DEBRIDEMENT & Injection of Left Shoulder;  Surgeon: Alta Corning, MD;  Location: [place];  Service: Orthopedics;  Laterality: Right;  . US echocardiography  06/19/2011    RV mildly dilated,mild to mod. MR,mild AI,mild PI  . Nm myocar perf wall motion  11/27/2007    inferior scar  . Inguinal hernia repair Right 05/04/2014    Procedure: OPEN RIGHT INGUINAL HERNIA REPAIR with mesh;  Surgeon: Edward Jolly, MD;  [facility];  Service: General;  Laterality: Right;    There were no vitals filed for this visit.  Visit Diagnosis:  Difficulty walking  Unsteadiness  Muscle weakness  Decreased functional activity tolerance      Subjective  Assessment - 09/14/15 1144    Subjective Jeremy Parrish reports he is doing about average today. No additional complaints at this time. Say HEP is going well.    Pertinent History Patient had a stroke in 2011-2012; he did receive therapy after this stroke, including Jeremy Parrish and Speech. No falls recently, no real close calls. He does have a pacemaker and history of an aortic anuerysm. He states that he feels like there might be something different going on with his body.    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    Currently in Pain? No/denies                         [facility] Adult Jeremy Parrish Treatment/Exercise - 09/14/15 0001    Ambulation/Gait   Ambulation Distance (Feet) 1073 Feet   Assistive device None   Gait velocity 1.54m.s   Gait Comments 1/10 chest pain toward end of session;  angina reported as WNL, resolves with rest.    Knee/Hip Exercises: Stretches   Passive Hamstring Stretch 2 reps;30 seconds;Both  stadning 14" block   Knee/Hip Exercises: Aerobic   Nustep hills 3 level 3 x 9'   Other Aerobic walking 16mwt   Knee/Hip Exercises: Standing   Heel Raises 15 reps   Heel Raises Limitations heel raises harrow stance flat  tow raise backwards on slant board Parrish BUE support 2x10   Hip Flexion Both;2 sets;15 reps;Knee bent;AROM  reciprocal    Rocker Board 2 minutes   Rocker Board Limitations R/L and A/P  62m ea direction, no UE support   Knee/Hip Exercises: Seated   Long Arc Quad 2 sets;10 reps;Both;Weights   Long Arc Quad Weight 5 lbs.   Marching Limitations 2x10 bilat  5#              Balance Exercises - 09/14/15 1139    Balance Exercises: Standing   Standing Eyes Opened Narrow base of support (BOS);Foam/compliant surface;5 reps;Other (comment)  Sinlg eUE over head reach; lateral rotation Parrish ball, PNF diag           Jeremy Parrish Education - 09/14/15 1149    Education provided No          Jeremy Parrish Short Term  Goals - 09/05/15 1131    Jeremy Parrish SHORT TERM GOAL #1   Title Patient will improve 6 minute walk to at least 1231ft with no rest breaks and no noticeable decrease in speed/increase in fatigue towards latter part of gait distance    Time 3   Period Weeks   Status New   Jeremy Parrish SHORT  TERM GOAL #2   Title Patient to score at least 52 on BERG balance test to demonstrate reduced fall risk during functional activities    Time 3   Period Weeks   Status New   Jeremy Parrish SHORT TERM GOAL #3   Title Patient will be independent in correctly and consistently performing appropriate HEP, to be updated PRN    Time 3   Period Weeks   Status New           Jeremy Parrish Long Term Goals - 09/05/15 1151    Jeremy Parrish LONG TERM GOAL #1   Title Patient will demonstrate 5/5 strength in all muslces tested in order to promote enhanced balance and functional endurance with reduced fall risk    Time 6   Period Weeks   Status New   Jeremy Parrish LONG TERM GOAL #2   Title Patient will score at least a 54 on the BERG balance test in order to demonstrate significantly reduced fall risk during all functional situations    Time 6   Period Weeks   Status New   Jeremy Parrish LONG TERM GOAL #3   Title Patient will be able to ambulate at least 143ft duriung the 6 minute walk test in order to demonstrate improved functional actvity tolerance for improved performance in functional situations    Time 6   Period Weeks   Status New   Jeremy Parrish LONG TERM GOAL #4   Title Patient to be performing at least 20 minutes of light-moderate intensity exercise at least 3 days per week in [place] to maintain functional gains and promote overall improved healthy lifestyle    Time 6   Period Weeks   Status New               Plan - 09/14/15 1150    Clinical Impression Statement Jeremy Parrish making progress toward goals. Continues to show imporved strength, activity tolerance, balance and fucntional mobility. Should continue to progress tolerance to higher exertion activities such as stairs  climibing and prolonged walking.    Jeremy Parrish will benefit from skilled therapeutic intervention in order to improve on the following deficits Abnormal gait;Hypomobility;Cardiopulmonary status limiting activity;Decreased activity tolerance;Decreased strength;Impaired UE functional use;Decreased balance;Decreased mobility;Difficulty walking;Improper body mechanics;Decreased coordination;Impaired flexibility;Postural dysfunction   Rehab Potential Good   Jeremy Parrish Frequency 2x / week   Jeremy Parrish Duration 6 weeks   Jeremy Parrish Treatment/Interventions ADLs/Self Care Home Management;Gait training;Therapeutic activities;Therapeutic exercise;Balance training;Neuromuscular re-education;Patient/family education;Manual techniques;Energy conservation   Jeremy Parrish Next Visit Plan Continue with functional strengthening and balance training   Jeremy Parrish Home Exercise Plan given    Consulted and Agree with Plan of Care Patient        Problem List Patient Active Problem List   Diagnosis Date Noted  . Hypokalemia 11/03/2014  . URI (upper respiratory infection) 11/03/2014  . Weakness 11/03/2014  . Obstructive sleep apnea 08/07/2014  . Central sleep apnea 08/07/2014  . Inguinal hernia 05/04/2014  . Chest pain with moderate risk for cardiac etiology 11/25/2013  . Fever 09/13/2013  . Pneumonia 09/13/2013  . Pancytopenia ([address]) 09/13/2013  . Muscle weakness (generalized) 08/04/2013  . Status post arthroscopy of shoulder 08/04/2013  . Pain in joint, shoulder region 08/04/2013  . Rotator cuff tear arthropathy of right shoulder 07/21/2013  . Left rotator cuff tear arthropathy 07/21/2013  . Shortness of breath 07/07/2013  . Aneurysm of iliac artery ([place]) 01/26/2013  . Hyperlipidemia with target LDL less than 100 10/08/2012  . Prediabetes 10/08/2012  . Expressive aphasia 10/08/2012  . Hemiplegia affecting  right dominant side ([place]) 10/08/2012  . H/O cardiac pacemaker, Medtronic REVO, MRI conditional device, placed 07/2011 for sympyomatic bradycardia  10/07/2012  . Hx of bladder cancer 10/07/2012  . 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 ASTHMA, UNSPECIFIED 03/22/2010  . Nonspecific (abnormal) findings on radiological and other examination of body structure 03/22/2010  . Hx of lymphoma 03/21/2010  . Multiple myeloma in remission ([place]) 03/21/2010  . Anxiety state 03/21/2010  . Essential hypertension 03/21/2010  . MYOCARDIAL INFARCTION 03/21/2010  . Coronary atherosclerosis 03/21/2010  . NEPHROLITHIASIS 03/21/2010  . ELEVATED PROSTATE SPECIFIC ANTIGEN 03/21/2010  . ROTATOR CUFF REPAIR, RIGHT, HX OF 03/21/2010    Jeremy Parrish 09/14/2015, 11:53 AM  11:54 AM  Jeremy Parrish, Jeremy Parrish, Jeremy Parrish [place] License # [id number]       [facility] [address] [place], [place], [address] Phone: [phone]   Fax:  [phone]  Name: Jeremy Parrish MRN: [id number] Date of Birth: 09-07-1941

## 2015-09-15 LAB — KAPPA/LAMBDA LIGHT CHAINS
KAPPA FREE LGHT CHN: 21.38 mg/L — AB (ref 3.30–19.40)
Kappa, lambda light chain ratio: 1.05 (ref 0.26–1.65)
LAMDA FREE LIGHT CHAINS: 20.4 mg/L (ref 5.71–26.30)

## 2015-09-15 LAB — BETA 2 MICROGLOBULIN, SERUM: BETA 2 MICROGLOBULIN: 1.6 mg/L (ref 0.6–2.4)

## 2015-09-15 NOTE — Progress Notes (Signed)
LABS DRAWN

## 2015-09-18 LAB — MULTIPLE MYELOMA PANEL, SERUM
ALBUMIN/GLOB SERPL: 1.4 (ref 0.7–1.7)
ALPHA2 GLOB SERPL ELPH-MCNC: 0.6 g/dL (ref 0.4–1.0)
Albumin SerPl Elph-Mcnc: 3.6 g/dL (ref 2.9–4.4)
Alpha 1: 0.2 g/dL (ref 0.0–0.4)
B-Globulin SerPl Elph-Mcnc: 0.9 g/dL (ref 0.7–1.3)
Gamma Glob SerPl Elph-Mcnc: 0.9 g/dL (ref 0.4–1.8)
Globulin, Total: 2.7 g/dL (ref 2.2–3.9)
IGG (IMMUNOGLOBIN G), SERUM: 836 mg/dL (ref 700–1600)
IGM, SERUM: 26 mg/dL (ref 15–143)
IgA: 255 mg/dL (ref 61–437)
Total Protein ELP: 6.3 g/dL (ref 6.0–8.5)

## 2015-09-19 ENCOUNTER — Ambulatory Visit (HOSPITAL_COMMUNITY): Payer: Medicare Other | Admitting: Physical Therapy

## 2015-09-19 DIAGNOSIS — R2681 Unsteadiness on feet: Secondary | ICD-10-CM

## 2015-09-19 DIAGNOSIS — R6889 Other general symptoms and signs: Secondary | ICD-10-CM | POA: Diagnosis not present

## 2015-09-19 DIAGNOSIS — R262 Difficulty in walking, not elsewhere classified: Secondary | ICD-10-CM | POA: Diagnosis not present

## 2015-09-19 DIAGNOSIS — M25512 Pain in left shoulder: Secondary | ICD-10-CM | POA: Diagnosis not present

## 2015-09-19 DIAGNOSIS — M6281 Muscle weakness (generalized): Secondary | ICD-10-CM | POA: Diagnosis not present

## 2015-09-19 DIAGNOSIS — M25511 Pain in right shoulder: Secondary | ICD-10-CM | POA: Diagnosis not present

## 2015-09-19 NOTE — Assessment & Plan Note (Addendum)
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 He has been on maintenance Revlimid 10 mg for 7 days on and 7 days off without any intolerances to date.  Continue maintenance Revlimid.  Labs in 4 weeks: CBC diff, CMET  Labs in 8 weeks: CBC diff, CMET, CRP, B2M, MM panel.  Rx refilled for Hydrocodone.  I will defer tx of zoster to primary care provider who has started him on Valtex.  Return in 8 weeks for follow-up.  He is on ASA daily.

## 2015-09-19 NOTE — Progress Notes (Signed)
Jeremy Hillier, MD [place] [address]  Multiple myeloma in remission [facility]) - Plan: CBC with Differential, Comprehensive metabolic panel  Hypogammaglobulinemia ([place]) - Plan: CBC with Differential, Comprehensive metabolic panel  Hx of lymphoma  Pain in joint of right shoulder - Plan: HYDROcodone-acetaminophen (NORCO/VICODIN) 5-325 MG tablet  CURRENT THERAPY: Revlimid 10 mg daily for 7 days on and 7 days off and monthly low-dose IVIG.  INTERVAL HISTORY: Jeremy Parrish 74 y.o. 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 Marcell Anger. 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 In 2011 he was diagnosed with IgG lambda multiple myeloma and treated with 4 cycles of Velcade and dexamethasone followed by autologous peripheral blood stem cell transplant and currently remains on maintenance Revlimid and also monthly intravenous IgG. Zometa 4 mg intravenously monthly was discontinued with last treatment on 08/18/2014.  AND Hypogammaglobulinemia with history of frequent and recurrent infections requiring antibiotics and high dose IVIG until monthly low-dose IVIG was instituted with an excellent response with minimal antibiotic requirements.  I personally reviewed and went over laboratory results with the patient.  The results are noted within this dictation.  He continues with physical therapy and reports that is going well.  "I get sore with them, buddy."  He requests a refill on Hydrocodone.  He reports that his wife is recovering from hip arthroplasty.  He notes that it was very painful recovery for her.  He denies any antibiotic needs and  hospitalizations.    He continues to tolerate Revlimid.  His WBCs historically have never been robust but tend to trend low or low-normal.    Past Medical History  Diagnosis Date  . Hypertension   . Heart disease   . Kidney stones     history  . Lung mass   . Heart murmur   . Hypogammaglobulinemia ([place]) 09/28/2012    Secondary to Lymphoma and Multiple Myeloma and their treatments  . Coronary artery disease   . Depression   . Shortness of breath   . Peripheral arterial disease ([place])   . Bladder neck contracture   . Personal history of other diseases of circulatory system   . Aortic aneurysm of unspecified site without mention of rupture   . Arthritis   . Intestinovesical fistula   . Esophageal reflux   . Hyperlipidemia   . Anemia   . CHF (congestive heart failure) ([place])   . COPD (chronic obstructive pulmonary disease) ([place])   . Cerebral atherosclerosis     Carotid Doppler, 02/16/2013 - Bilateral Proximal ICAs,demonstrate mild plaque w/o evidence of significant diameter reduction, dissection, or any other vascular abnormality  . Complication of anesthesia   . PONV (postoperative nausea and vomiting)   . Stroke [facility]) 2013    Speech.  . Hx of bladder cancer 10/07/2012  . Sleep apnea     05-02-14 cpap , not yet used- suggested settings 5  . Cancer ([place])   . Prostate cancer ([place]) 2000  . Multiple myeloma   . Non Hodgkin's lymphoma ([place])   . 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); Chest pain with moderate risk for cardiac etiology; Inguinal hernia; Obstructive sleep apnea; Central sleep apnea; Hypokalemia; URI (upper respiratory infection); and Weakness on his problem list.     is allergic to diphenhydramine hcl; morphine and related; and tape.  Current Outpatient Prescriptions on File Prior to Visit  Medication Sig Dispense Refill  . albuterol (PROVENTIL HFA;VENTOLIN HFA) 108 (90 BASE) MCG/ACT inhaler Inhale 2 puffs into the lungs every 6 (six) hours as needed for wheezing or shortness of breath. 1 Inhaler 2  . ALPRAZolam (XANAX) 0.5 MG tablet Take 1-2 tablets (0.5-1 mg total) by mouth at bedtime as needed for anxiety. 90 tablet 1  . amLODipine (NORVASC) 10 MG tablet TAKE 1 TABLET (10 MG TOTAL) BY MOUTH EVERY MORNING. 90 tablet 0  . aspirin EC 81 MG tablet Take 81 mg by mouth at bedtime.    . celecoxib (CELEBREX) 100 MG capsule Take 1 capsule (100 mg total) by mouth 2 (two) times daily. 60 capsule 2  . citalopram (CELEXA) 40 MG tablet Take 1 tablet (40 mg total) by mouth daily. 90 tablet 3  . clopidogrel (PLAVIX) 75 MG tablet TAKE 1 TABLET BY MOUTH EVERY DAY 90 tablet 3  . fexofenadine (ALLEGRA ALLERGY) 180 MG tablet Take 1 tablet (180 mg total) by mouth daily. 30 tablet 5  . fluticasone (FLONASE) 50 MCG/ACT nasal spray Place 2 sprays into both nostrils daily. 16 g 6  . furosemide (LASIX) 20 MG tablet Use sparingly prn edema no more than one per week 21 tablet 0  . lenalidomide (REVLIMID) 10 MG capsule Take 1 capsule (10 mg total) by mouth daily.  Take 1 capsule by mouth daily for 7 days on followed by 7 days off 14 capsule 0  . omeprazole (PRILOSEC) 20 MG capsule TAKE 1 CAPSULE (20 MG TOTAL) BY MOUTH DAILY. 90 capsule 1  . Potassium Chloride ER 20 MEQ TBCR Take 20 mEq by mouth 2 (two) times daily. 60 tablet 2  . simvastatin (ZOCOR) 20 MG tablet Take 1 tablet (20 mg total) by mouth at bedtime. 90 tablet 3  . doxycycline (VIBRA-TABS) 100 MG tablet Take 1 tablet (100 mg total) by mouth 2 (two) times daily. (Patient not taking: Reported on 08/23/2015) 14 tablet 0  . nitroGLYCERIN (NITROSTAT) 0.4 MG SL tablet Place 1 tablet (0.4 mg total) under the tongue every 5 (five) minutes as needed for chest pain. (Patient not taking: Reported on 08/23/2015) 25 tablet 0  . ondansetron (ZOFRAN) 4 MG tablet Take 1 tablet (4 mg total) by mouth every 8 (eight) hours as needed for nausea or vomiting. (Patient not taking: Reported on 08/23/2015) 30 tablet 2  . traMADol (ULTRAM) 50 MG tablet Take 1 tablet (50 mg total) by mouth every 8 (eight) hours as needed. (Patient not taking: Reported on 08/23/2015) 60  tablet 2  . valACYclovir (VALTREX) 1000 MG tablet Take 1 tablet (1,000 mg total) by mouth 3 (three) times daily. (Patient not taking: Reported on 09/20/2015) 21 tablet 0  . zolpidem (AMBIEN CR) 6.25 MG CR tablet Take 1 tablet (6.25 mg total) by mouth at bedtime as needed for sleep. (Patient not taking: Reported on 08/23/2015) 30 tablet 0   Current Facility-Administered Medications on File Prior to Visit  Medication Dose Route Frequency Provider Last Rate Last Dose  . acetaminophen (TYLENOL) tablet 650 mg  650 mg Oral Q6H PRN Baird Cancer, PA-C   650 mg at 12/09/14 0855  . sodium chloride 0.9 % injection 10 mL  10 mL Intracatheter PRN Baird Cancer, PA-C   10 mL at 12/09/14 5852    Past Surgical History  Procedure Laterality Date  . Prostate surgery    . Heart stents x 5  1999  . Portacath placement  07/26/2009    right chest  . Wrist surgery       right  . Left ear skin cancer removed    . Bone marrow transplant  2011  . Pacemaker insertion  07/22/2011    Medtronic  . Coronary angioplasty  06/24/2000    PCI and stenting in mid & proximal RCA  . Insert / replace / remove pacemaker    . Tee without cardioversion  10/13/2012    Procedure: TRANSESOPHAGEAL ECHOCARDIOGRAM (TEE);  Surgeon: Sanda Klein, MD;  Location: [facility] ENDOSCOPY;  Service: Cardiovascular;  Laterality: N/A;  pat/kay/echo notified  . Colonoscopy N/A 01/01/2013    Procedure: COLONOSCOPY;  Surgeon: Rogene Houston, MD;  [facility] ENDO SU[facility];  Service: Endoscopy;  Laterality: N/A;  825-moved to [place] notified pt  . Rotator    . Rotator cuff repair Right   . Colon surgery      colon resection  . Bladder surgery    . Shoulder arthroscopy with subacromial decompression Right 07/21/2013    Procedure: RIGHT SHOULDER ARTHROSCOPY WITH SUBACROMIAL DECOMPRESSION AND DEBRIDEMENT & Injection of Left Shoulder;  Surgeon: Alta Corning, MD;  Location: [place];  Service: Orthopedics;  Laterality: Right;  . US echocardiography  06/19/2011    RV mildly dilated,mild to mod. MR,mild AI,mild PI  . Nm myocar perf wall motion  11/27/2007    inferior scar  . Inguinal hernia repair Right 05/04/2014    Procedure: OPEN RIGHT INGUINAL HERNIA REPAIR with mesh;  Surgeon: Edward Jolly, MD;  Location: [facility] ORS;  Service: General;  Laterality: Right;    Denies any headaches, dizziness, double vision, fevers, chills, night sweats, nausea, vomiting, diarrhea, constipation, chest pain, heart palpitations, shortness of breath, blood in stool, black tarry stool, urinary pain, urinary burning, urinary frequency, hematuria.   PHYSICAL EXAMINATION  ECOG PERFORMANCE STATUS: 1 - Symptomatic but completely ambulatory  Filed Vitals:   09/20/15 1015  BP: 136/82  Pulse: 72  Temp: 97.7 F (36.5 C)  Resp: 20    GENERAL:alert, no distress, well nourished, well developed, comfortable, cooperative, smiling and  initially resting in a chemo-recliner, unaccompanied SKIN: skin color, texture, turgor are normal, no rashes or significant lesions HEAD: Normocephalic, No masses, lesions, tenderness or abnormalities EYES: normal, PERRLA, EOMI, Conjunctiva are pink and non-injected EARS: External ears normal OROPHARYNX:lips, buccal mucosa, and tongue normal and mucous membranes are moist  NECK: supple, thyroid normal size, non-tender, without nodularity, trachea midline LYMPH:  no palpable lymphadenopathy BREAST:not examined LUNGS: clear to auscultation and percussion HEART: regular rate & rhythm, no  murmurs, no gallops, S1 normal and S2 normal ABDOMEN:abdomen soft, non-tender and normal bowel sounds BACK: Back symmetric, no curvature., No CVA tenderness EXTREMITIES:less then 2 second capillary refill, no joint deformities, effusion, or inflammation, no skin discoloration, no cyanosis  NEURO: alert & oriented x 3 with fluent speech, no focal motor/sensory deficits, gait normal   LABORATORY DATA: CBC    Component Value Date/Time   WBC 3.8* 09/14/2015 1004   RBC 4.17* 09/14/2015 1004   HGB 12.3* 09/14/2015 1004   HCT 37.2* 09/14/2015 1004   PLT 142* 09/14/2015 1004   MCV 89.2 09/14/2015 1004   MCH 29.5 09/14/2015 1004   MCHC 33.1 09/14/2015 1004   RDW 14.9 09/14/2015 1004   LYMPHSABS 1.3 09/14/2015 1004   MONOABS 0.5 09/14/2015 1004   EOSABS 0.1 09/14/2015 1004   BASOSABS 0.1 09/14/2015 1004      Chemistry      Component Value Date/Time   NA 138 09/14/2015 1004   K 3.4* 09/14/2015 1004   CL 107 09/14/2015 1004   CO2 24 09/14/2015 1004   BUN 9 09/14/2015 1004   CREATININE 0.83 09/14/2015 1004   CREATININE 0.80 11/07/2014 1034      Component Value Date/Time   CALCIUM 8.8* 09/14/2015 1004   ALKPHOS 63 09/14/2015 1004   AST 24 09/14/2015 1004   ALT 19 09/14/2015 1004   BILITOT 0.5 09/14/2015 1004      Lab Results  Component Value Date   PROT 6.6 09/14/2015   ALBUMINELP 56.9  01/06/2015   A1GS 5.1* 01/06/2015   A2GS 10.4 01/06/2015   BETS 7.2 01/06/2015   BETA2SER 5.5 01/06/2015   GAMS 14.9 01/06/2015   MSPIKE NOT DETECTED 01/06/2015   SPEI (NOTE) 01/06/2015   SPECOM (NOTE) 01/06/2015   IGGSERUM 836 09/14/2015   IGA 255 09/14/2015   IGMSERUM 26 09/14/2015   IMMELINT (NOTE) 01/06/2015   KPAFRELGTCHN 21.38* 09/14/2015   LAMBDASER 20.40 09/14/2015   KAPLAMBRATIO 1.05 09/14/2015    PENDING LABS:   RADIOGRAPHIC STUDIES:  No results found.   PATHOLOGY:    ASSESSMENT AND PLAN:  Multiple myeloma in remission Pleasant 74 year old male with a history of IgG lambda multiple myeloma status post stem cell transplant in October 2011. He has been on maintenance Revlimid 10 mg for 7 days on and 7 days off without any intolerances to date.  Continue maintenance Revlimid.  Labs in 4 weeks: CBC diff, CMET  Labs in 8 weeks: CBC diff, CMET, CRP, B2M, MM panel.  Rx refilled for Hydrocodone.  I will defer tx of zoster to primary care provider who has started him on Valtex.  Return in 8 weeks for follow-up.  He is on ASA daily.  Hypogammaglobulinemia Receiving IVIG every 4 weeks without any recent documented infections.  Tolerating well.   IVIG today and repeated every 4 weeks.  Hx of lymphoma NED    THERAPY PLAN:  Continue with current treatment.  Continue monthly IVIG.  Continue Revlimid 7 days on and 7 days off.  We will monitor WBC.  All questions were answered. The patient knows to call the clinic with any problems, questions or concerns. We can certainly see the patient much sooner if necessary.  Patient and plan discussed with Dr. Ancil Linsey and she is in agreement with the aforementioned.   This note is electronically signed by: Robynn Pane, PA-C 09/20/2015 11:22 AM

## 2015-09-19 NOTE — Assessment & Plan Note (Signed)
NED

## 2015-09-19 NOTE — Therapy (Signed)
[place], [place], [address] Phone: [phone]   Fax:  [phone]  Physical Therapy Treatment  Patient Details  Name: Jeremy Parrish MRN: [id number] Date of Birth: Oct 07, 1941 Referring Provider: Patrici Ranks   Encounter Date: 09/19/2015      PT End of Session - 09/19/15 1146    Visit Number 5   Number of Visits 12   Date for PT Re-Evaluation 10/03/15   Authorization Type Medicare and BCBS    Authorization Time Period 09/05/15 to 11/05/15   Authorization - Visit Number 5   Authorization - Number of Visits 10   PT Start Time 1100   PT Stop Time 1154   PT Time Calculation (min) 54 min   Equipment Utilized During Treatment Gait belt   Activity Tolerance Patient tolerated treatment well      Past Medical History  Diagnosis Date  . Hypertension   . Heart disease   . Kidney stones     history  . Lung mass   . Heart murmur   . Hypogammaglobulinemia ([place]) 09/28/2012    Secondary to Lymphoma and Multiple Myeloma and their treatments  . Coronary artery disease   . Depression   . Shortness of breath   . Peripheral arterial disease ([place])   . Bladder neck contracture   . Personal history of other diseases of circulatory system   . Aortic aneurysm of unspecified site without mention of rupture   . Arthritis   . Intestinovesical fistula   . Esophageal reflux   . Hyperlipidemia   . Anemia   . CHF (congestive heart failure) ([place])   . COPD (chronic obstructive pulmonary disease) ([place])   . Cerebral atherosclerosis     Carotid Doppler, 02/16/2013 - Bilateral Proximal ICAs,demonstrate mild plaque w/o evidence of significant diameter reduction, dissection, or any other vascular abnormality  . Complication of anesthesia   . PONV (postoperative nausea and vomiting)   . Stroke [facility]) 2013    Speech.  . Hx of bladder cancer 10/07/2012  . Sleep apnea     05-02-14 cpap , not yet used- suggested settings 5  .  Cancer ([place])   . Prostate cancer ([place]) 2000  . Multiple myeloma   . Non Hodgkin's lymphoma ([place])   . Myocardial infarction [facility])     '96    Past Surgical History  Procedure Laterality Date  . Prostate surgery    . Heart stents x 5  1999  . Portacath placement  07/26/2009    right chest  . Wrist surgery      right  . Left ear skin cancer removed    . Bone marrow transplant  2011  . Pacemaker insertion  07/22/2011    Medtronic  . Coronary angioplasty  06/24/2000    PCI and stenting in mid & proximal RCA  . Insert / replace / remove pacemaker    . Tee without cardioversion  10/13/2012    Procedure: TRANSESOPHAGEAL ECHOCARDIOGRAM (TEE);  Surgeon: Sanda Klein, MD;  Location: [facility] ENDOSCOPY;  Service: Cardiovascular;  Laterality: N/A;  pat/kay/echo notified  . Colonoscopy N/A 01/01/2013    Procedure: COLONOSCOPY;  Surgeon: Rogene Houston, MD;  [facility] ENDO SU[facility];  Service: Endoscopy;  Laterality: N/A;  825-moved to [place] notified pt  . Rotator    . Rotator cuff repair Right   . Colon surgery      colon resection  . Bladder surgery    . Shoulder  arthroscopy with subacromial decompression Right 07/21/2013    Procedure: RIGHT SHOULDER ARTHROSCOPY WITH SUBACROMIAL DECOMPRESSION AND DEBRIDEMENT & Injection of Left Shoulder;  Surgeon: Alta Corning, MD;  Location: [place];  Service: Orthopedics;  Laterality: Right;  . US echocardiography  06/19/2011    RV mildly dilated,mild to mod. MR,mild AI,mild PI  . Nm myocar perf wall motion  11/27/2007    inferior scar  . Inguinal hernia repair Right 05/04/2014    Procedure: OPEN RIGHT INGUINAL HERNIA REPAIR with mesh;  Surgeon: Edward Jolly, MD;  Location: [facility] ORS;  Service: General;  Laterality: Right;    There were no vitals filed for this visit.  Visit Diagnosis:  Difficulty walking  Unsteadiness  Muscle weakness  Decreased functional activity tolerance      Subjective Assessment - 09/19/15 1058    Subjective Pt states that he  feels like he is getting sick.  Pt states that he is sore in his arms from the bike,(states he has had past rotator cuff tear).    Currently in Pain? No/denies                 [facility] Adult PT Treatment/Exercise - 09/19/15 0001    Knee/Hip Exercises: Aerobic   Nustep hills 3; level 2 x 6"   Knee/Hip Exercises: Standing   Heel Raises Both;15 reps   Heel Raises Limitations with functional squats    Forward Lunges Both;10 reps   Side Lunges Both;10 reps   Functional Squat 15 reps   Other Standing Knee Exercises marching x 10 RT;    Other Standing Knee Exercises sidestep with green t-band x 2 RT    Knee/Hip Exercises: Seated   Sit to Sand 20 reps  Rt in back x 10/Lt in back for 10              Balance Exercises - 09/19/15 1137    Balance Exercises: Standing   Standing, One Foot on a Step 8 inch;Foam/compliant surface;2 reps;30 secs             PT Short Term Goals - 09/05/15 1131    PT SHORT TERM GOAL #1   Title Patient will improve 6 minute walk to at least 1245f with no rest breaks and no noticeable decrease in speed/increase in fatigue towards latter part of gait distance    Time 3   Period Weeks   Status New   PT SHORT TERM GOAL #2   Title Patient to score at least 52 on BERG balance test to demonstrate reduced fall risk during functional activities    Time 3   Period Weeks   Status New   PT SHORT TERM GOAL #3   Title Patient will be independent in correctly and consistently performing appropriate HEP, to be updated PRN    Time 3   Period Weeks   Status New           PT Long Term Goals - 09/05/15 1151    PT LONG TERM GOAL #1   Title Patient will demonstrate 5/5 strength in all muslces tested in order to promote enhanced balance and functional endurance with reduced fall risk    Time 6   Period Weeks   Status New   PT LONG TERM GOAL #2   Title Patient will score at least a 54 on the BERG balance test in order to demonstrate significantly reduced  fall risk during all functional situations    Time 6   Period Weeks  Status New   PT LONG TERM GOAL #3   Title Patient will be able to ambulate at least 1486f duriung the 6 minute walk test in order to demonstrate improved functional actvity tolerance for improved performance in functional situations    Time 6   Period Weeks   Status New   PT LONG TERM GOAL #4   Title Patient to be performing at least 20 minutes of light-moderate intensity exercise at least 3 days per week in [place] to maintain functional gains and promote overall improved healthy lifestyle    Time 6   Period Weeks   Status New               Plan - 09/19/15 1147    Clinical Impression Statement Pt progressed in balance and strengthening activity with verbal and tactle cuing for safety.  Pt needed multiple rest breaks between actiities but did not need to rest long.  This might be due to pt not feeling well today.    PT Next Visit Plan add balance beam activities as well as hurdles.         Problem List Patient Active Problem List   Diagnosis Date Noted  . Hypokalemia 11/03/2014  . URI (upper respiratory infection) 11/03/2014  . Weakness 11/03/2014  . Obstructive sleep apnea 08/07/2014  . Central sleep apnea 08/07/2014  . Inguinal hernia 05/04/2014  . Chest pain with moderate risk for cardiac etiology 11/25/2013  . Fever 09/13/2013  . Pneumonia 09/13/2013  . Pancytopenia ([place] 09/13/2013  . Muscle weakness (generalized) 08/04/2013  . Status post arthroscopy of shoulder 08/04/2013  . Pain in joint, shoulder region 08/04/2013  . Rotator cuff tear arthropathy of right shoulder 07/21/2013  . Left rotator cuff tear arthropathy 07/21/2013  . Shortness of breath 07/07/2013  . Aneurysm of iliac artery ([place] 01/26/2013  . Hyperlipidemia with target LDL less than 100 10/08/2012  . Prediabetes 10/08/2012  . Expressive aphasia 10/08/2012  . Hemiplegia affecting right dominant side ([place] 10/08/2012  . H/O  cardiac pacemaker, Medtronic REVO, MRI conditional device, placed 07/2011 for sympyomatic bradycardia 10/07/2012  . Hx of bladder cancer 10/07/2012  . 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 Hypogammaglobulinemia ([place] 09/28/2012  . ASTHMA, UNSPECIFIED 03/22/2010  . Nonspecific (abnormal) findings on radiological and other examination of body structure 03/22/2010  . Hx of lymphoma 03/21/2010  . Multiple myeloma in remission ([place] 03/21/2010  . Anxiety state 03/21/2010  . Essential hypertension 03/21/2010  . MYOCARDIAL INFARCTION 03/21/2010  . Coronary atherosclerosis 03/21/2010  . NEPHROLITHIASIS 03/21/2010  . ELEVATED PROSTATE SPECIFIC ANTIGEN 03/21/2010  . ROTATOR CUFF REPAIR, RIGHT, HX OF 03/21/2010    CRayetta Humphrey PT CLT [phone]/06/2015, 11:49 AM  [place] [address]Phone: [phone]  Fax:  [phone] Name: RDIAR BERKELMRN: [id number] of Birth: [birth year]

## 2015-09-19 NOTE — Assessment & Plan Note (Addendum)
Receiving IVIG every 4 weeks without any recent documented infections.  Tolerating well.   IVIG today and repeated every 4 weeks.

## 2015-09-20 ENCOUNTER — Encounter (HOSPITAL_BASED_OUTPATIENT_CLINIC_OR_DEPARTMENT_OTHER): Payer: Medicare Other

## 2015-09-20 ENCOUNTER — Encounter (HOSPITAL_BASED_OUTPATIENT_CLINIC_OR_DEPARTMENT_OTHER): Payer: Medicare Other | Admitting: Oncology

## 2015-09-20 VITALS — BP 136/82 | HR 72 | Temp 97.7°F | Resp 20 | Wt 187.2 lb

## 2015-09-20 VITALS — BP 129/65 | HR 61 | Temp 97.6°F | Resp 18 | Wt 187.2 lb

## 2015-09-20 DIAGNOSIS — M25511 Pain in right shoulder: Secondary | ICD-10-CM

## 2015-09-20 DIAGNOSIS — Z8572 Personal history of non-Hodgkin lymphomas: Secondary | ICD-10-CM | POA: Diagnosis not present

## 2015-09-20 DIAGNOSIS — D801 Nonfamilial hypogammaglobulinemia: Secondary | ICD-10-CM

## 2015-09-20 DIAGNOSIS — C9001 Multiple myeloma in remission: Secondary | ICD-10-CM | POA: Diagnosis not present

## 2015-09-20 MED ORDER — HEPARIN SOD (PORK) LOCK FLUSH 100 UNIT/ML IV SOLN
250.0000 [IU] | Freq: Once | INTRAVENOUS | Status: DC | PRN
  Filled 2015-09-20: qty 5

## 2015-09-20 MED ORDER — SODIUM CHLORIDE 0.9 % IV SOLN
Freq: Once | INTRAVENOUS | Status: AC
  Administered 2015-09-20: 10:00:00 via INTRAVENOUS

## 2015-09-20 MED ORDER — IMMUNE GLOBULIN (HUMAN) 10 GM/100ML IV SOLN: 0.4000 g/kg | Freq: Once | INTRAVENOUS | Status: DC

## 2015-09-20 MED ORDER — ACETAMINOPHEN 325 MG PO TABS
650.0000 mg | ORAL_TABLET | Freq: Four times a day (QID) | ORAL | Status: DC | PRN
  Administered 2015-09-20: 650 mg via ORAL
  Filled 2015-09-20: qty 2

## 2015-09-20 MED ORDER — SODIUM CHLORIDE 0.9 % IJ SOLN
10.0000 mL | INTRAMUSCULAR | Status: DC | PRN
  Administered 2015-09-20: 10 mL
  Filled 2015-09-20: qty 10

## 2015-09-20 MED ORDER — HYDROCODONE-ACETAMINOPHEN 5-325 MG PO TABS: 1.0000 | ORAL_TABLET | Freq: Two times a day (BID) | ORAL | Status: DC | PRN

## 2015-09-20 MED ORDER — IMMUNE GLOBULIN (HUMAN) 5 GM/50ML IV SOLN
35.0000 g | Freq: Once | INTRAVENOUS | Status: AC
  Administered 2015-09-20: 35 g via INTRAVENOUS
  Filled 2015-09-20: qty 50

## 2015-09-20 MED ORDER — HEPARIN SOD (PORK) LOCK FLUSH 100 UNIT/ML IV SOLN
500.0000 [IU] | Freq: Once | INTRAVENOUS | Status: AC
  Administered 2015-09-20: 500 [IU] via INTRAVENOUS

## 2015-09-20 NOTE — Progress Notes (Signed)
1235:  Tolerated infusion w/o adverse reaction. VSS.  Discharged ambulatory.

## 2015-09-20 NOTE — Patient Instructions (Addendum)
[place] at [facility] Discharge Instructions  RECOMMENDATIONS MADE BY THE CONSULTANT AND ANY TEST RESULTS WILL BE SENT TO YOUR REFERRING PHYSICIAN.  Jeremy Parrish RX for hydrocodone given to patient. Complete the prescription of valtrex you have. Labs every 4 weeks. IVIG every 4 weeks. Continue physical therapy. MD appointment in 2 months. Return as scheduled.  Thank you for choosing [place] at [facility] to provide your oncology and hematology care.  To afford each patient quality time with our provider, please arrive at least 15 minutes before your scheduled appointment time.    You need to re-schedule your appointment should you arrive 10 or more minutes late.  We strive to give you quality time with our providers, and arriving late affects you and other patients whose appointments are after yours.  Also, if you no show three or more times for appointments you may be dismissed from the clinic at the providers discretion.     Again, thank you for choosing [facility].  Our hope is that these requests will decrease the amount of time that you wait before being seen by our physicians.       _____________________________________________________________  Should you have questions after your [facility] to [facility], please contact our office at [phone] between the hours of 8:30 a.m. and 4:30 p.m.  Voicemails left after 4:30 p.m. will not be returned until the following business day.  For prescription refill requests, have your pharmacy contact our office.

## 2015-09-21 ENCOUNTER — Ambulatory Visit (HOSPITAL_COMMUNITY): Payer: Medicare Other

## 2015-09-21 ENCOUNTER — Ambulatory Visit (HOSPITAL_COMMUNITY): Payer: Medicare Other | Admitting: Occupational Therapy

## 2015-09-21 ENCOUNTER — Encounter (HOSPITAL_COMMUNITY): Payer: Self-pay | Admitting: Occupational Therapy

## 2015-09-21 DIAGNOSIS — M25511 Pain in right shoulder: Secondary | ICD-10-CM

## 2015-09-21 DIAGNOSIS — R2681 Unsteadiness on feet: Secondary | ICD-10-CM | POA: Diagnosis not present

## 2015-09-21 DIAGNOSIS — M25611 Stiffness of right shoulder, not elsewhere classified: Secondary | ICD-10-CM

## 2015-09-21 DIAGNOSIS — R262 Difficulty in walking, not elsewhere classified: Secondary | ICD-10-CM

## 2015-09-21 DIAGNOSIS — M25512 Pain in left shoulder: Secondary | ICD-10-CM | POA: Diagnosis not present

## 2015-09-21 DIAGNOSIS — R6889 Other general symptoms and signs: Secondary | ICD-10-CM | POA: Diagnosis not present

## 2015-09-21 DIAGNOSIS — M629 Disorder of muscle, unspecified: Secondary | ICD-10-CM

## 2015-09-21 DIAGNOSIS — M6289 Other specified disorders of muscle: Secondary | ICD-10-CM

## 2015-09-21 DIAGNOSIS — M6281 Muscle weakness (generalized): Secondary | ICD-10-CM | POA: Diagnosis not present

## 2015-09-21 DIAGNOSIS — M25612 Stiffness of left shoulder, not elsewhere classified: Secondary | ICD-10-CM

## 2015-09-21 NOTE — Therapy (Signed)
[place], [place], [address] Phone: [phone]   Fax:  [phone]  Occupational Therapy Evaluation  Patient Details  Name: Jeremy Parrish MRN: [id number] Date of Birth: 08-Mar-1941 Referring Provider: Dr. Whitney Muse  Encounter Date: 09/21/2015      OT End of Session - 09/21/15 1256    Visit Number 1   Number of Visits 12   Date for OT Re-Evaluation 11/20/15  Mini reassessment 10/19/2015   Authorization Type Medicare   Authorization Time Period Before 10th visit   Authorization - Visit Number 1   Authorization - Number of Visits 10   OT Start Time 1102   OT Stop Time 1145   OT Time Calculation (min) 43 min   Activity Tolerance Patient tolerated treatment well   Behavior During Therapy [facility] for tasks assessed/performed      Past Medical History  Diagnosis Date  . Hypertension   . Heart disease   . Kidney stones     history  . Lung mass   . Heart murmur   . 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 Coronary artery disease   . Depression   . Shortness of breath   . Peripheral arterial disease ([place])   . Bladder neck contracture   . Personal history of other diseases of circulatory system   . Aortic aneurysm of unspecified site without mention of rupture   . Arthritis   . Intestinovesical fistula   . Esophageal reflux   . Hyperlipidemia   . Anemia   . CHF (congestive heart failure) ([place])   . COPD (chronic obstructive pulmonary disease) ([place])   . Cerebral atherosclerosis     Carotid Doppler, 02/16/2013 - Bilateral Proximal ICAs,demonstrate mild plaque w/o evidence of significant diameter reduction, dissection, or any other vascular abnormality  . Complication of anesthesia   . PONV (postoperative nausea and vomiting)   . Stroke [facility]) 2013    Speech.  . Hx of bladder cancer 10/07/2012  . Sleep apnea     05-02-14 cpap , not yet used-  suggested settings 5  . Cancer ([place])   . Prostate cancer ([place]) 2000  . Multiple myeloma   . Non Hodgkin's lymphoma ([place])   . Myocardial infarction [facility])     '96    Past Surgical History  Procedure Laterality Date  . Prostate surgery    . Heart stents x 5  1999  . Portacath placement  07/26/2009    right chest  . Wrist surgery      right  . Left ear skin cancer removed    . Bone marrow transplant  2011  . Pacemaker insertion  07/22/2011    Medtronic  . Coronary angioplasty  06/24/2000    PCI and stenting in mid & proximal RCA  . Insert / replace / remove pacemaker    . Tee without cardioversion  10/13/2012    Procedure: TRANSESOPHAGEAL ECHOCARDIOGRAM (TEE);  Surgeon: Sanda Klein, MD;  Location: [facility] ENDOSCOPY;  Service: Cardiovascular;  Laterality: N/A;  pat/kay/echo notified  . Colonoscopy N/A 01/01/2013    Procedure: COLONOSCOPY;  Surgeon: Rogene Houston, MD;  [facility]O [facility];  Service: Endoscopy;  Laterality: N/A;  825-moved to [place] notified pt  . Rotator    . Rotator cuff repair Right   . Colon surgery      colon resection  . Bladder surgery    . Shoulder  arthroscopy with subacromial decompression Right 07/21/2013    Procedure: RIGHT SHOULDER ARTHROSCOPY WITH SUBACROMIAL DECOMPRESSION AND DEBRIDEMENT & Injection of Left Shoulder;  Surgeon: Alta Corning, MD;  Location: [place];  Service: Orthopedics;  Laterality: Right;  . US echocardiography  06/19/2011    RV mildly dilated,mild to mod. MR,mild AI,mild PI  . Nm myocar perf wall motion  11/27/2007    inferior scar  . Inguinal hernia repair Right 05/04/2014    Procedure: OPEN RIGHT INGUINAL HERNIA REPAIR with mesh;  Surgeon: Edward Jolly, MD;  [facility] ORS;  Service: General;  Laterality: Right;    There were no vitals filed for this visit.  Visit Diagnosis:  Pain of both shoulder joints - Plan: Ot plan of care cert/re-cert  Stiffness of shoulder joint, left - Plan: Ot plan of care  cert/re-cert  Stiffness of shoulder joint, right - Plan: Ot plan of care cert/re-cert  Tight fascia - Plan: Ot plan of care cert/re-cert  Muscle weakness (generalized) - Plan: Ot plan of care cert/re-cert      Subjective Assessment - 09/21/15 1250    Subjective  S: My left hurts worse than my right shoulder.    Pertinent History Pt is a 74 y/o male s/p presenting with bilateral shoulder pain, onset after CVA several years ago. The pain in bilateral shoulders is limiting his participation in ADL tasks. Pt reports he uses ice and heat for pain management, ice helps more than heat. Dr. Whitney Muse referred pt to occupational therapy for evaluation and treatment.    Patient Stated Goals To be able to reach into higher cabinets.    Currently in Pain? Yes   Pain Score 4    Pain Location Shoulder   Pain Orientation Right;Left   Pain Descriptors / Indicators Aching   Pain Type Chronic pain           [facility] OT Assessment - 09/21/15 1106    Assessment   Diagnosis Hx of CVA-shoulder pain   Referring Provider Dr. Whitney Muse   Onset Date --  pain in both shoulders for several years ago   Prior Therapy therapy when had right RCR several years ago   Precautions   Precautions None   Balance Screen   Has the patient fallen in the past 6 months No   Has the patient had a decrease in activity level because of a fear of falling?  No   Is the patient reluctant to leave their home because of a fear of falling?  No   Home  Environment   Family/patient expects to be discharged to: Private residence   Prior Function   Level of Independence Independent with basic ADLs   Vocation Retired   Leisure gardening, helping wife, mowing grass   ADL   ADL comments Pt has difficulty with grooming tasks like washing hair, reaching into high cabinets, lifting lightweight objects.    Written Expression   Dominant Hand Right   Cognition   Overall Cognitive Status Within Functional Limits for tasks assessed   ROM /  Strength   AROM / PROM / Strength AROM;PROM;Strength   Palpation   Palpation comment Pt has moderate fascial restrictions in bilateral upper arm, trapezius, and serratus anterior regions.    AROM   Overall AROM Comments Assessed seated, ER/IR adducted    AROM Assessment Site Shoulder   Right/Left Shoulder Right;Left   Right Shoulder Flexion 145 Degrees   Right Shoulder ABduction 135 Degrees   Right Shoulder Internal Rotation  60 Degrees   Right Shoulder External Rotation 53 Degrees   Left Shoulder Flexion 120 Degrees   Left Shoulder ABduction 113 Degrees   Left Shoulder Internal Rotation 69 Degrees   Left Shoulder External Rotation 50 Degrees   PROM   Overall PROM Comments Assessed supine, ER/IR adducted    PROM Assessment Site Shoulder   Right/Left Shoulder Left;Right   Right Shoulder Flexion 149 Degrees   Right Shoulder ABduction 151 Degrees   Right Shoulder Internal Rotation 90 Degrees   Right Shoulder External Rotation 78 Degrees   Left Shoulder Flexion 125 Degrees   Left Shoulder ABduction 116 Degrees   Left Shoulder Internal Rotation 90 Degrees   Left Shoulder External Rotation 65 Degrees   Strength   Overall Strength Comments Assessed seated, ER/IR adducted    Strength Assessment Site Shoulder   Right/Left Shoulder Right;Left   Right Shoulder Flexion 3+/5   Right Shoulder ABduction 3+/5   Right Shoulder Internal Rotation 4/5   Right Shoulder External Rotation 4-/5   Left Shoulder Flexion 3+/5   Left Shoulder ABduction 3+/5   Left Shoulder Internal Rotation 4/5   Left Shoulder External Rotation 4-/5                                                OT Education - 09/21/15 1255    Education provided Yes   Education Details Shoulder stretches   Person(s) Educated Patient   Methods Explanation;Demonstration;Handout   Comprehension Verbalized understanding;Returned demonstration          OT Short Term Goals - 09/21/15 1556    OT SHORT TERM  GOAL #1   Title Pt will be educated on and independent in HEP.    Time 6   Period Weeks   Status New   OT SHORT TERM GOAL #2   Title Pt will decrease fascial restrictions from mod to min amounts in BUE to decrease pain and increase range of motion.   Time 6   Period Weeks   Status New   OT SHORT TERM GOAL #3   Title Pt will decrease pain to 3/10 or less in BUE to increase ability to use during daily tasks.    Time 6   Period Weeks   Status New   OT SHORT TERM GOAL #4   Title Pt will increase A/ROM to [facility] to increase ability to reach into high cabinets during daily tasks.    Time 6   Period Weeks   Status New   OT SHORT TERM GOAL #5   Title Pt will increase strength to 4+/5 to increase ability to complete gardening and household chores using BUE.    Time 6   Period Weeks   Status New                  Plan - 09/21/15 1257    Clinical Impression Statement A: Pt is a 74 y/o male with hx of CVA presenting with bilateral shoulder pain that has been ongoing for several years. Pt has increased fascial restrictions and pain, decreased range of motion and strength in BUE limiting his ability to complete daily tasks. Pt has greater pain and stiffness in LUE than RUE.    Pt will benefit from skilled therapeutic intervention in order to improve on the following deficits (Retired) Decreased strength;Pain;Impaired UE functional use;Increased fascial restricitons;Decreased  range of motion;Impaired flexibility   Rehab Potential Good   OT Frequency 2x / week   OT Duration 6 weeks   OT Treatment/Interventions Self-care/ADL training;Passive range of motion;Patient/family education;Cryotherapy;Electrical Stimulation;Moist Heat;Therapeutic exercise;Manual Therapy;Therapeutic activities   Plan P: Complete FOTO next session. Pt would benefit from skilled OT services to decrease pain and fascial restrictions, increase range of motion and strength, and increase functional use of BUEs. Treatment  plan: myofascial release, manual stretching, P/ROM, A/ROM, general strengthening, scapular stability and strengthening, proximal shoulder strengthening.    OT Home Exercise Plan shoulder stretches   Consulted and Agree with Plan of Care Patient          G-Codes - 10-19-2015 1559    Functional Assessment Tool Used clinical judgement   Functional Limitation Carrying, moving and handling objects   Carrying, Moving and Handling Objects Current Status [phone]) At least 40 percent but less than 60 percent impaired, limited or restricted   Carrying, Moving and Handling Objects Goal Status ([id number]) At least 20 percent but less than 40 percent impaired, limited or restricted      Problem List Patient Active Problem List   Diagnosis Date Noted  . Hypokalemia 11/03/2014  . URI (upper respiratory infection) 11/03/2014  . Weakness 11/03/2014  . Obstructive sleep apnea 08/07/2014  . Central sleep apnea 08/07/2014  . Inguinal hernia 05/04/2014  . Chest pain with moderate risk for cardiac etiology 11/25/2013  . Fever 09/13/2013  . Pneumonia 09/13/2013  . Pancytopenia ([place]) 09/13/2013  . Muscle weakness (generalized) 08/04/2013  . Status post arthroscopy of shoulder 08/04/2013  . Pain in joint, shoulder region 08/04/2013  . Rotator cuff tear arthropathy of right shoulder 07/21/2013  . Left rotator cuff tear arthropathy 07/21/2013  . Shortness of breath 07/07/2013  . 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 Hyperlipidemia with target LDL less than 100 10/08/2012  . Prediabetes 10/08/2012  . Expressive aphasia 10/08/2012  . Hemiplegia affecting right dominant side ([place]) 10/08/2012  . 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 Hx of bladder cancer 10/07/2012  . 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 ASTHMA, UNSPECIFIED 03/22/2010  . Nonspecific (abnormal)  findings on radiological and other examination of body structure 03/22/2010  . Hx of lymphoma 03/21/2010  . Multiple myeloma in remission ([place]) 03/21/2010  . Anxiety state 03/21/2010  . Essential hypertension 03/21/2010  . MYOCARDIAL INFARCTION 03/21/2010  . Coronary atherosclerosis 03/21/2010  . NEPHROLITHIASIS 03/21/2010  . ELEVATED PROSTATE SPECIFIC ANTIGEN 03/21/2010  . ROTATOR CUFF REPAIR, RIGHT, HX OF 03/21/2010    Guadelupe Sabin, OTR/L  [phone]  19-Oct-2015, 4:06 PM  [facility] [address] [place], [place], [address] Phone: [phone]   Fax:  [phone]  Name: Jeremy Parrish MRN: [id number] Date of Birth: 07/12/1941

## 2015-09-21 NOTE — Patient Instructions (Signed)
  1) Flexion Wall Stretch    Face wall, place affected handon wall in front of you. Slide hand up the wall  and lean body in towards the wall. Hold for 5 seconds. Repeat 10 times. 1-2 times/day.     2) Towel Stretch with Internal Rotation   Gently pull up your affected arm  behind your back with the assist of a towel            3) Corner Stretch    Stand at a corner of a wall, place your arms on the walls with elbows bent. Lean into the corner until a stretch is felt along the front of your chest and/or shoulders. Hold for 5 seconds. Repeat 10X, 1-2 times/day.    4) Posterior Capsule Stretch    Bring the involved arm across chest. Grasp elbow and pull toward chest until you feel a stretch in the back of the upper arm and shoulder. Hold 5 seconds. Repeat 10X. Complete 1-2 times/day.    5) Scapular Retraction    Tuck chin back as you pinch shoulder blades together.  Hold 5 seconds. Repeat 10X. Complete 1-2 times/day.   

## 2015-09-21 NOTE — Therapy (Signed)
[place], [place], [address] Phone: [phone]   Fax:  [phone]  Physical Therapy Treatment  Patient Details  Name: Jeremy Parrish MRN: [id number] Date of Birth: 09/11/1941 Referring Provider: Patrici Ranks   Encounter Date: 09/21/2015      PT End of Session - 09/21/15 1056    Visit Number 6   Number of Visits 12   Date for PT Re-Evaluation 10/03/15   Authorization Type Medicare and BCBS    Authorization Time Period 09/05/15 to 11/05/15   Authorization - Visit Number 6   Authorization - Number of Visits 10   PT Start Time 0263   PT Stop Time 1106   PT Time Calculation (min) 48 min   Equipment Utilized During Treatment Gait belt   Activity Tolerance Patient tolerated treatment well;No increased pain;Patient limited by fatigue   Behavior During Therapy [facility] for tasks assessed/performed      Past Medical History  Diagnosis Date  . Hypertension   . Heart disease   . Kidney stones     history  . Lung mass   . Heart murmur   . 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 Coronary artery disease   . Depression   . Shortness of breath   . Peripheral arterial disease ([place])   . Bladder neck contracture   . Personal history of other diseases of circulatory system   . Aortic aneurysm of unspecified site without mention of rupture   . Arthritis   . Intestinovesical fistula   . Esophageal reflux   . Hyperlipidemia   . Anemia   . CHF (congestive heart failure) ([place])   . COPD (chronic obstructive pulmonary disease) ([place])   . Cerebral atherosclerosis     Carotid Doppler, 02/16/2013 - Bilateral Proximal ICAs,demonstrate mild plaque w/o evidence of significant diameter reduction, dissection, or any other vascular abnormality  . Complication of anesthesia   . PONV (postoperative nausea and vomiting)   . Stroke [facility]) 2013    Speech.  . Hx  of bladder cancer 10/07/2012  . Sleep apnea     05-02-14 cpap , not yet used- suggested settings 5  . Cancer ([place])   . Prostate cancer ([place]) 2000  . Multiple myeloma   . Non Hodgkin's lymphoma ([place])   . Myocardial infarction [facility])     '96    Past Surgical History  Procedure Laterality Date  . Prostate surgery    . Heart stents x 5  1999  . Portacath placement  07/26/2009    right chest  . Wrist surgery      right  . Left ear skin cancer removed    . Bone marrow transplant  2011  . Pacemaker insertion  07/22/2011    Medtronic  . Coronary angioplasty  06/24/2000    PCI and stenting in mid & proximal RCA  . Insert / replace / remove pacemaker    . Tee without cardioversion  10/13/2012    Procedure: TRANSESOPHAGEAL ECHOCARDIOGRAM (TEE);  Surgeon: Sanda Klein, MD;  Location: [facility] ENDOSCOPY;  Service: Cardiovascular;  Laterality: N/A;  pat/kay/echo notified  . Colonoscopy N/A 01/01/2013    Procedure: COLONOSCOPY;  Surgeon: Rogene Houston, MD;  [facility]O [facility];  Service: Endoscopy;  Laterality: N/A;  825-moved to [place] notified pt  . Rotator    . Rotator cuff repair Right   . Colon surgery  colon resection  . Bladder surgery    . Shoulder arthroscopy with subacromial decompression Right 07/21/2013    Procedure: RIGHT SHOULDER ARTHROSCOPY WITH SUBACROMIAL DECOMPRESSION AND DEBRIDEMENT & Injection of Left Shoulder;  Surgeon: Alta Corning, MD;  Location: [place];  Service: Orthopedics;  Laterality: Right;  . US echocardiography  06/19/2011    RV mildly dilated,mild to mod. MR,mild AI,mild PI  . Nm myocar perf wall motion  11/27/2007    inferior scar  . Inguinal hernia repair Right 05/04/2014    Procedure: OPEN RIGHT INGUINAL HERNIA REPAIR with mesh;  Surgeon: Edward Jolly, MD;  [facility]S;  Service: General;  Laterality: Right;    There were no vitals filed for this visit.  Visit Diagnosis:  Difficulty walking  Unsteadiness  Muscle weakness  Decreased  functional activity tolerance      Subjective Assessment - 09/21/15 1019    Subjective Pt stated generalized pain.  Pt stated he has not completed a lot of HEP due to pain    Currently in Pain? Yes   Pain Score 5    Pain Location Generalized   Pain Descriptors / Indicators Aching           [facility] Adult PT Treatment/Exercise - 09/21/15 0001    Knee/Hip Exercises: Aerobic   Nustep hills 3; level 3 x 10"   Knee/Hip Exercises: Standing   Heel Raises Both;15 reps   Heel Raises Limitations with functional squats    Forward Lunges Both;15 reps   Side Lunges Both;10 reps   Functional Squat 15 reps   Other Standing Knee Exercises sidestep with green t-band x 2 RT              Balance Exercises - 09/21/15 1051    Balance Exercises: Standing   SLS 3 reps;Time  Rt 10" Lt 9"   Balance Beam Tandem, retro and sidestepping 2RT   Step Over Hurdles / Cones 6 and 12in hurdles 2RT             PT Short Term Goals - 09/05/15 1131    PT SHORT TERM GOAL #1   Title Patient will improve 6 minute walk to at least 1250f with no rest breaks and no noticeable decrease in speed/increase in fatigue towards latter part of gait distance    Time 3   Period Weeks   Status New   PT SHORT TERM GOAL #2   Title Patient to score at least 52 on BERG balance test to demonstrate reduced fall risk during functional activities    Time 3   Period Weeks   Status New   PT SHORT TERM GOAL #3   Title Patient will be independent in correctly and consistently performing appropriate HEP, to be updated PRN    Time 3   Period Weeks   Status New           PT Long Term Goals - 09/05/15 1151    PT LONG TERM GOAL #1   Title Patient will demonstrate 5/5 strength in all muslces tested in order to promote enhanced balance and functional endurance with reduced fall risk    Time 6   Period Weeks   Status New   PT LONG TERM GOAL #2   Title Patient will score at least a 54 on the BERG balance test in order  to demonstrate significantly reduced fall risk during all functional situations    Time 6   Period Weeks   Status New  PT LONG TERM GOAL #3   Title Patient will be able to ambulate at least 146f duriung the 6 minute walk test in order to demonstrate improved functional actvity tolerance for improved performance in functional situations    Time 6   Period Weeks   Status New   PT LONG TERM GOAL #4   Title Patient to be performing at least 20 minutes of light-moderate intensity exercise at least 3 days per week in [place] to maintain functional gains and promote overall improved healthy lifestyle    Time 6   Period Weeks   Status New               Plan - 09/21/15 1057    Clinical Impression Statement Progressed balance and functional strengthening activities this session with verbal and tactile cueing for proper form, technique and min assistance for safety.  Began balance activities on balance beam and hurdles with min assistance for LOB episodes.  Pt required several seated rest breaks due to fatigue, no reports of increased pain through session.     PT Next Visit Plan Continue with current PT POC for functional strenghtening and balance activities with balance beam and hurdles.          Problem List Patient Active Problem List   Diagnosis Date Noted  . Hypokalemia 11/03/2014  . URI (upper respiratory infection) 11/03/2014  . Weakness 11/03/2014  . Obstructive sleep apnea 08/07/2014  . Central sleep apnea 08/07/2014  . Inguinal hernia 05/04/2014  . Chest pain with moderate risk for cardiac etiology 11/25/2013  . Fever 09/13/2013  . Pneumonia 09/13/2013  . Pancytopenia ([place] 09/13/2013  . Muscle weakness (generalized) 08/04/2013  . Status post arthroscopy of shoulder 08/04/2013  . Pain in joint, shoulder region 08/04/2013  . Rotator cuff tear arthropathy of right shoulder 07/21/2013  . Left rotator cuff tear arthropathy 07/21/2013  . Shortness of breath 07/07/2013  .  Aneurysm of iliac artery ([place] 01/26/2013  . Hyperlipidemia with target LDL less than 100 10/08/2012  . Prediabetes 10/08/2012  . Expressive aphasia 10/08/2012  . Hemiplegia affecting right dominant side ([place] 10/08/2012  . 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 Hx of bladder cancer 10/07/2012  . 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 Hypogammaglobulinemia ([place] 09/28/2012  . ASTHMA, UNSPECIFIED 03/22/2010  . Nonspecific (abnormal) findings on radiological and other examination of body structure 03/22/2010  . Hx of lymphoma 03/21/2010  . Multiple myeloma in remission ([place] 03/21/2010  . Anxiety state 03/21/2010  . Essential hypertension 03/21/2010  . MYOCARDIAL INFARCTION 03/21/2010  . Coronary atherosclerosis 03/21/2010  . NEPHROLITHIASIS 03/21/2010  . ELEVATED PROSTATE SPECIFIC ANTIGEN 03/21/2010  . ROTATOR CUFF REPAIR, RIGHT, HX OF 03/21/2010   CIhor Austin LPTA; CBIS [phone] CAldona Lento11/08/2015, 11:03 AM  [place] [address]Phone: [phone]  Fax:  [phone] Name: RKLARK VANDERHOEFMRN: [id number] of Birth: [birth year]

## 2015-09-26 ENCOUNTER — Ambulatory Visit (HOSPITAL_COMMUNITY): Payer: Medicare Other | Admitting: Physical Therapy

## 2015-09-26 DIAGNOSIS — R2681 Unsteadiness on feet: Secondary | ICD-10-CM

## 2015-09-26 DIAGNOSIS — M25512 Pain in left shoulder: Secondary | ICD-10-CM | POA: Diagnosis not present

## 2015-09-26 DIAGNOSIS — R262 Difficulty in walking, not elsewhere classified: Secondary | ICD-10-CM

## 2015-09-26 DIAGNOSIS — R6889 Other general symptoms and signs: Secondary | ICD-10-CM | POA: Diagnosis not present

## 2015-09-26 DIAGNOSIS — M25511 Pain in right shoulder: Secondary | ICD-10-CM | POA: Diagnosis not present

## 2015-09-26 DIAGNOSIS — M6281 Muscle weakness (generalized): Secondary | ICD-10-CM

## 2015-09-26 NOTE — Therapy (Signed)
[facility] [address] [place], [place], [address] Phone: [phone]   Fax:  [phone]  Physical Therapy Treatment  Patient Details  Name: Jeremy Parrish MRN: [id number] Date of Birth: 03/06/1941 Referring Provider: Shannon K Penland   Encounter Date: 09/26/2015      PT End of Session - 09/26/15 1350    Visit Number 7   Number of Visits 12   Date for PT Re-Evaluation 10/03/15   Authorization Type Medicare and BCBS    Authorization Time Period 09/05/15 to 11/05/15   Authorization - Visit Number 7   Authorization - Number of Visits 10   PT Start Time 1016   PT Stop Time 1105   PT Time Calculation (min) 49 min   Equipment Utilized During Treatment Gait belt   Activity Tolerance Patient tolerated treatment well   Behavior During Therapy WFL for tasks assessed/performed      Past Medical History  Diagnosis Date  . Hypertension   . Heart disease   . Kidney stones     history  . Lung mass   . Heart murmur   . 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 Coronary artery disease   . Depression   . Shortness of breath   . Peripheral arterial disease (HCC)   . Bladder neck contracture   . Personal history of other diseases of circulatory system   . Aortic aneurysm of unspecified site without mention of rupture   . Arthritis   . Intestinovesical fistula   . Esophageal reflux   . Hyperlipidemia   . Anemia   . CHF (congestive heart failure) (HCC)   . COPD (chronic obstructive pulmonary disease) (HCC)   . Cerebral atherosclerosis     Carotid Doppler, 02/16/2013 - Bilateral Proximal ICAs,demonstrate mild plaque w/o evidence of significant diameter reduction, dissection, or any other vascular abnormality  . Complication of anesthesia   . PONV (postoperative nausea and vomiting)   . Stroke (HCC) 2013    Speech.  . Hx of bladder cancer 10/07/2012  . Sleep apnea      05-02-14 cpap , not yet used- suggested settings 5  . Cancer (HCC)   . Prostate cancer (HCC) 2000  . Multiple myeloma   . Non Hodgkin's lymphoma (HCC)   . Myocardial infarction (HCC)     '96    Past Surgical History  Procedure Laterality Date  . Prostate surgery    . Heart stents x 5  1999  . Portacath placement  07/26/2009    right chest  . Wrist surgery      right  . Left ear skin cancer removed    . Bone marrow transplant  2011  . Pacemaker insertion  07/22/2011    Medtronic  . Coronary angioplasty  06/24/2000    PCI and stenting in mid & proximal RCA  . Insert / replace / remove pacemaker    . Tee without cardioversion  10/13/2012    Procedure: TRANSESOPHAGEAL ECHOCARDIOGRAM (TEE);  Surgeon: Mihai Croitoru, MD;  [facility];  Service: Cardiovascular;  Laterality: N/A;  pat/kay/echo notified  . Colonoscopy N/A 01/01/2013    Procedure: COLONOSCOPY;  Surgeon: Najeeb U [name], MD;  [facility]: [facility];  Service: Endoscopy;  Laterality: N/A;  825-moved to 940 Ann notified pt  . Rotator    . Rotator cuff repair Right   . Colon surgery      colon resection  .   Bladder surgery    . Shoulder arthroscopy with subacromial decompression Right 07/21/2013    Procedure: RIGHT SHOULDER ARTHROSCOPY WITH SUBACROMIAL DECOMPRESSION AND DEBRIDEMENT & Injection of Left Shoulder;  Surgeon: John L Graves, MD;  Location: [facility] OR;  Service: Orthopedics;  Laterality: Right;  . Us echocardiography  06/19/2011    RV mildly dilated,mild to mod. MR,mild AI,mild PI  . Nm myocar perf wall motion  11/27/2007    inferior scar  . Inguinal hernia repair Right 05/04/2014    Procedure: OPEN RIGHT INGUINAL HERNIA REPAIR with mesh;  Surgeon: Benjamin T Hoxworth, MD;  Location: [facility] ORS;  Service: General;  Laterality: Right;    There were no vitals filed for this visit.  Visit Diagnosis:  Muscle weakness (generalized)  Difficulty walking  Unsteadiness  Decreased functional activity tolerance       Subjective Assessment - 09/26/15 1028    Subjective Pt reports that he is a little sore today, he has been having some trouble going up and down stairs.    Currently in Pain? Yes   Pain Score 4                  [facility] Adult PT Treatment/Exercise - 09/26/15 0001    Knee/Hip Exercises: Aerobic   Nustep hills 3; level 3 x 10"   Knee/Hip Exercises: Standing   Heel Raises Both;15 reps   Forward Lunges Both;15 reps   Forward Lunges Limitations 6" step   Side Lunges Both;10 reps   Side Lunges Limitations 6" step   Functional Squat 15 reps   Knee/Hip Exercises: Sidelying   Hip ABduction Both;15 reps             Balance Exercises - 09/26/15 1043    Balance Exercises: Standing   SLS with Vectors Solid surface;Foam/compliant surface;2 reps;Limitations  cone taps 2x5   Balance Beam Tandem, retro and sidestepping 2RT   Step Over Hurdles / Cones 6 and 12in hurdles 2RT             PT Short Term Goals - 09/05/15 1131    PT SHORT TERM GOAL #1   Title Patient will improve 6 minute walk to at least 1200ft with no rest breaks and no noticeable decrease in speed/increase in fatigue towards latter part of gait distance    Time 3   Period Weeks   Status New   PT SHORT TERM GOAL #2   Title Patient to score at least 52 on BERG balance test to demonstrate reduced fall risk during functional activities    Time 3   Period Weeks   Status New   PT SHORT TERM GOAL #3   Title Patient will be independent in correctly and consistently performing appropriate HEP, to be updated PRN    Time 3   Period Weeks   Status New           PT Long Term Goals - 09/05/15 1151    PT LONG TERM GOAL #1   Title Patient will demonstrate 5/5 strength in all muslces tested in order to promote enhanced balance and functional endurance with reduced fall risk    Time 6   Period Weeks   Status New   PT LONG TERM GOAL #2   Title Patient will score at least a 54 on the BERG balance test in order to  demonstrate significantly reduced fall risk during all functional situations    Time 6   Period Weeks   Status New   PT   LONG TERM GOAL #3   Title Patient will be able to ambulate at least 1400ft duriung the 6 minute walk test in order to demonstrate improved functional actvity tolerance for improved performance in functional situations    Time 6   Period Weeks   Status New   PT LONG TERM GOAL #4   Title Patient to be performing at least 20 minutes of light-moderate intensity exercise at least 3 days per week in roder to maintain functional gains and promote overall improved healthy lifestyle    Time 6   Period Weeks   Status New               Plan - 09/26/15 1432    Clinical Impression Statement Continued with strength and balance training today. Cone taps were added standing on solid surface and standing on airex to challenge dynamic balance, pt required verbal cueing for maintaining proper posture throughout the exercise. Pt required min-mod A when completing balance beam activities in order to prevent LOB, verbal cueing provided for foot position during balance training on beam.    PT Next Visit Plan Continue with balance and strength training, stair training        Problem List Patient Active Problem List   Diagnosis Date Noted  . Hypokalemia 11/03/2014  . URI (upper respiratory infection) 11/03/2014  . Weakness 11/03/2014  . Obstructive sleep apnea 08/07/2014  . Central sleep apnea 08/07/2014  . Inguinal hernia 05/04/2014  . Chest pain with moderate risk for cardiac etiology 11/25/2013  . Fever 09/13/2013  . Pneumonia 09/13/2013  . Pancytopenia (HCC) 09/13/2013  . Muscle weakness (generalized) 08/04/2013  . Status post arthroscopy of shoulder 08/04/2013  . Pain in joint, shoulder region 08/04/2013  . Rotator cuff tear arthropathy of right shoulder 07/21/2013  . Left rotator cuff tear arthropathy 07/21/2013  . Shortness of breath 07/07/2013  . Aneurysm of iliac  artery (HCC) 01/26/2013  . Hyperlipidemia with target LDL less than 100 10/08/2012  . Prediabetes 10/08/2012  . Expressive aphasia 10/08/2012  . Hemiplegia affecting right dominant side (HCC) 10/08/2012  . 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 Hx of bladder cancer 10/07/2012  . 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 ASTHMA, UNSPECIFIED 03/22/2010  . Nonspecific (abnormal) findings on radiological and other examination of body structure 03/22/2010  . Hx of lymphoma 03/21/2010  . Multiple myeloma in remission (HCC) 03/21/2010  . Anxiety state 03/21/2010  . Essential hypertension 03/21/2010  . MYOCARDIAL INFARCTION 03/21/2010  . Coronary atherosclerosis 03/21/2010  . NEPHROLITHIASIS 03/21/2010  . ELEVATED PROSTATE SPECIFIC ANTIGEN 03/21/2010  . ROTATOR CUFF REPAIR, RIGHT, HX OF 03/21/2010     , PT, DPT [phone] 09/26/2015, 2:36 PM  [facility] [address] [place], [place], [address] Phone: [phone]   Fax:  [phone]  Name: Soma R Mesch MRN: [id number] Date of Birth: 10/15/1941     

## 2015-09-27 DIAGNOSIS — Z9481 Bone marrow transplant status: Secondary | ICD-10-CM | POA: Diagnosis not present

## 2015-09-27 DIAGNOSIS — Z888 Allergy status to other drugs, medicaments and biological substances status: Secondary | ICD-10-CM | POA: Diagnosis not present

## 2015-09-27 DIAGNOSIS — Z9079 Acquired absence of other genital organ(s): Secondary | ICD-10-CM | POA: Diagnosis not present

## 2015-09-27 DIAGNOSIS — Z8546 Personal history of malignant neoplasm of prostate: Secondary | ICD-10-CM | POA: Diagnosis not present

## 2015-09-27 DIAGNOSIS — C9000 Multiple myeloma not having achieved remission: Secondary | ICD-10-CM | POA: Diagnosis not present

## 2015-09-27 DIAGNOSIS — D801 Nonfamilial hypogammaglobulinemia: Secondary | ICD-10-CM | POA: Diagnosis not present

## 2015-09-27 DIAGNOSIS — Z885 Allergy status to narcotic agent status: Secondary | ICD-10-CM | POA: Diagnosis not present

## 2015-09-27 DIAGNOSIS — C8590 Non-Hodgkin lymphoma, unspecified, unspecified site: Secondary | ICD-10-CM | POA: Diagnosis not present

## 2015-09-28 ENCOUNTER — Ambulatory Visit (HOSPITAL_COMMUNITY): Payer: Medicare Other | Admitting: Physical Therapy

## 2015-09-28 DIAGNOSIS — R2681 Unsteadiness on feet: Secondary | ICD-10-CM | POA: Diagnosis not present

## 2015-09-28 DIAGNOSIS — R262 Difficulty in walking, not elsewhere classified: Secondary | ICD-10-CM | POA: Diagnosis not present

## 2015-09-28 DIAGNOSIS — M25512 Pain in left shoulder: Secondary | ICD-10-CM

## 2015-09-28 DIAGNOSIS — M25611 Stiffness of right shoulder, not elsewhere classified: Secondary | ICD-10-CM

## 2015-09-28 DIAGNOSIS — M6289 Other specified disorders of muscle: Secondary | ICD-10-CM

## 2015-09-28 DIAGNOSIS — M25612 Stiffness of left shoulder, not elsewhere classified: Secondary | ICD-10-CM

## 2015-09-28 DIAGNOSIS — M629 Disorder of muscle, unspecified: Secondary | ICD-10-CM

## 2015-09-28 DIAGNOSIS — M25511 Pain in right shoulder: Secondary | ICD-10-CM

## 2015-09-28 DIAGNOSIS — M6281 Muscle weakness (generalized): Secondary | ICD-10-CM | POA: Diagnosis not present

## 2015-09-28 DIAGNOSIS — R6889 Other general symptoms and signs: Secondary | ICD-10-CM | POA: Diagnosis not present

## 2015-09-28 NOTE — Therapy (Signed)
[place], [place], [address] Phone: [phone]   Fax:  [phone]  Physical Therapy Treatment  Patient Details  Name: Jeremy Parrish MRN: [id number] Date of Birth: 1941-09-29 Referring Provider: Patrici Ranks   Encounter Date: 09/28/2015      PT End of Session - 09/28/15 1150    Visit Number 8   Number of Visits 12   Date for PT Re-Evaluation 10/03/15   Authorization Type Medicare and BCBS    Authorization Time Period 09/05/15 to 11/05/15   Authorization - Visit Number 8   Authorization - Number of Visits 10   PT Start Time 1102   PT Stop Time 1153   PT Time Calculation (min) 51 min   Equipment Utilized During Treatment Gait belt   Activity Tolerance Patient tolerated treatment well   Behavior During Therapy [facility] for tasks assessed/performed      Past Medical History  Diagnosis Date  . Hypertension   . Heart disease   . Kidney stones     history  . Lung mass   . Heart murmur   . Hypogammaglobulinemia ([place]) 09/28/2012    Secondary to Lymphoma and Multiple Myeloma and their treatments  . Coronary artery disease   . Depression   . Shortness of breath   . Peripheral arterial disease ([place])   . Bladder neck contracture   . Personal history of other diseases of circulatory system   . Aortic aneurysm of unspecified site without mention of rupture   . Arthritis   . Intestinovesical fistula   . Esophageal reflux   . Hyperlipidemia   . Anemia   . CHF (congestive heart failure) ([place])   . COPD (chronic obstructive pulmonary disease) ([place])   . Cerebral atherosclerosis     Carotid Doppler, 02/16/2013 - Bilateral Proximal ICAs,demonstrate mild plaque w/o evidence of significant diameter reduction, dissection, or any other vascular abnormality  . Complication of anesthesia   . PONV (postoperative nausea and vomiting)   . Stroke [facility]) 2013    Speech.  . Hx of bladder cancer 10/07/2012  . Sleep apnea      05-02-14 cpap , not yet used- suggested settings 5  . Cancer ([place])   . Prostate cancer ([place]) 2000  . Multiple myeloma   . Non Hodgkin's lymphoma ([place])   . Myocardial infarction [facility])     '96    Past Surgical History  Procedure Laterality Date  . Prostate surgery    . Heart stents x 5  1999  . Portacath placement  07/26/2009    right chest  . Wrist surgery      right  . Left ear skin cancer removed    . Bone marrow transplant  2011  . Pacemaker insertion  07/22/2011    Medtronic  . Coronary angioplasty  06/24/2000    PCI and stenting in mid & proximal RCA  . Insert / replace / remove pacemaker    . Tee without cardioversion  10/13/2012    Procedure: TRANSESOPHAGEAL ECHOCARDIOGRAM (TEE);  Surgeon: Sanda Klein, MD;  Location: [facility] ENDOSCOPY;  Service: Cardiovascular;  Laterality: N/A;  pat/kay/echo notified  . Colonoscopy N/A 01/01/2013    Procedure: COLONOSCOPY;  Surgeon: Rogene Houston, MD;  [facility];  Service: Endoscopy;  Laterality: N/A;  825-moved to [place] notified pt  . Rotator    . Rotator cuff repair Right   . Colon surgery      colon resection  .  Bladder surgery    . Shoulder arthroscopy with subacromial decompression Right 07/21/2013    Procedure: RIGHT SHOULDER ARTHROSCOPY WITH SUBACROMIAL DECOMPRESSION AND DEBRIDEMENT & Injection of Left Shoulder;  Surgeon: Alta Corning, MD;  Location: [place];  Service: Orthopedics;  Laterality: Right;  . US echocardiography  06/19/2011    RV mildly dilated,mild to mod. MR,mild AI,mild PI  . Nm myocar perf wall motion  11/27/2007    inferior scar  . Inguinal hernia repair Right 05/04/2014    Procedure: OPEN RIGHT INGUINAL HERNIA REPAIR with mesh;  Surgeon: Edward Jolly, MD;  [facility]: [facility];  Service: General;  Laterality: Right;    There were no vitals filed for this visit.  Visit Diagnosis:  Muscle weakness (generalized)  Difficulty walking  Unsteadiness  Decreased functional activity tolerance  Pain  of both shoulder joints  Stiffness of shoulder joint, left  Stiffness of shoulder joint, right  Tight fascia  Muscle weakness      Subjective Assessment - 09/28/15 1104    Subjective Pt states he returned to cancer MD yesterday in [facility] and good report of being cancer free and has been for past 6 years.  STates he did some garden tilling yesterday and is sore today in his shoulders and knees.    Currently in Pain? No/denies                         [facility] Adult PT Treatment/Exercise - 09/28/15 1106    Knee/Hip Exercises: Stretches   Active Hamstring Stretch Both;3 reps;30 seconds   Active Hamstring Stretch Limitations 12" step   Knee/Hip Exercises: Aerobic   Nustep hills 3; level 3 x 6"   Knee/Hip Exercises: Standing   Heel Raises 10 reps   Heel Raises Limitations with functional squats    Forward Lunges Both;15 reps   Forward Lunges Limitations 4" step   Side Lunges Both;10 reps   Side Lunges Limitations 4" step   Hip Abduction Both;10 reps   Hip Extension Both;10 reps   Lateral Step Up Both;10 reps;Step Height: 6";Hand Hold: 1   Lateral Step Up Limitations 6"   Forward Step Up Both;10 reps;Step Height: 6";Hand Hold: 1   Forward Step Up Limitations 6"   Rocker Board 2 minutes   Rocker Board Limitations R/L and A/P   Other Standing Knee Exercises sidestep with green t-band x 2 RT              Balance Exercises - 09/28/15 1138    Balance Exercises: Standing   Balance Beam Tandem, retro and sidestepping 2RT   Step Over Hurdles / Cones 6 and 12in hurdles 2RT             PT Short Term Goals - 09/05/15 1131    PT SHORT TERM GOAL #1   Title Patient will improve 6 minute walk to at least 1242ft with no rest breaks and no noticeable decrease in speed/increase in fatigue towards latter part of gait distance    Time 3   Period Weeks   Status New   PT SHORT TERM GOAL #2   Title Patient to score at least 52 on BERG balance test to  demonstrate reduced fall risk during functional activities    Time 3   Period Weeks   Status New   PT SHORT TERM GOAL #3   Title Patient will be independent in correctly and consistently performing appropriate HEP, to be updated PRN  Time 3   Period Weeks   Status New           PT Long Term Goals - 09/05/15 1151    PT LONG TERM GOAL #1   Title Patient will demonstrate 5/5 strength in all muslces tested in order to promote enhanced balance and functional endurance with reduced fall risk    Time 6   Period Weeks   Status New   PT LONG TERM GOAL #2   Title Patient will score at least a 54 on the BERG balance test in order to demonstrate significantly reduced fall risk during all functional situations    Time 6   Period Weeks   Status New   PT LONG TERM GOAL #3   Title Patient will be able to ambulate at least 1435ft duriung the 6 minute walk test in order to demonstrate improved functional actvity tolerance for improved performance in functional situations    Time 6   Period Weeks   Status New   PT LONG TERM GOAL #4   Title Patient to be performing at least 20 minutes of light-moderate intensity exercise at least 3 days per week in roder to maintain functional gains and promote overall improved healthy lifestyle    Time 6   Period Weeks   Status New               Plan - 09/28/15 1151    Clinical Impression Statement Continued focus on increasing LE strength and balance.  PRogressed to 4" step with lunges and added 6" forward and lateral step ups to plan of care.  PT with improved balance performance today wtih airex and negotiating hurdles.      PT Next Visit Plan Continue with balance and strength training, stair training.  Re-eval X 2 more sessions.        Problem List Patient Active Problem List   Diagnosis Date Noted  . Hypokalemia 11/03/2014  . URI (upper respiratory infection) 11/03/2014  . Weakness 11/03/2014  . Obstructive sleep apnea 08/07/2014  .  Central sleep apnea 08/07/2014  . Inguinal hernia 05/04/2014  . Chest pain with moderate risk for cardiac etiology 11/25/2013  . Fever 09/13/2013  . Pneumonia 09/13/2013  . Pancytopenia ([place]) 09/13/2013  . Muscle weakness (generalized) 08/04/2013  . Status post arthroscopy of shoulder 08/04/2013  . Pain in joint, shoulder region 08/04/2013  . Rotator cuff tear arthropathy of right shoulder 07/21/2013  . Left rotator cuff tear arthropathy 07/21/2013  . Shortness of breath 07/07/2013  . Aneurysm of iliac artery ([place]) 01/26/2013  . Hyperlipidemia with target LDL less than 100 10/08/2012  . Prediabetes 10/08/2012  . Expressive aphasia 10/08/2012  . Hemiplegia affecting right dominant side ([place]) 10/08/2012  . 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 Hx of bladder cancer 10/07/2012  . 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 Hypogammaglobulinemia ([place]) 09/28/2012  . ASTHMA, UNSPECIFIED 03/22/2010  . Nonspecific (abnormal) findings on radiological and other examination of body structure 03/22/2010  . Hx of lymphoma 03/21/2010  . Multiple myeloma in remission ([place]) 03/21/2010  . Anxiety state 03/21/2010  . Essential hypertension 03/21/2010  . MYOCARDIAL INFARCTION 03/21/2010  . Coronary atherosclerosis 03/21/2010  . NEPHROLITHIASIS 03/21/2010  . ELEVATED PROSTATE SPECIFIC ANTIGEN 03/21/2010  . ROTATOR CUFF REPAIR, RIGHT, HX OF 03/21/2010    Teena Irani, PTA/CLT [phone]  09/28/2015, 12:05 PM  [facility] [address] [place], [place], [address] Phone: [phone]   Fax:  [phone]  Name: Jeremy Parrish MRN: [id number] Date of Birth: 1941/09/24

## 2015-10-03 ENCOUNTER — Ambulatory Visit (HOSPITAL_COMMUNITY): Payer: Medicare Other | Admitting: Specialist

## 2015-10-03 ENCOUNTER — Ambulatory Visit (HOSPITAL_COMMUNITY): Payer: Medicare Other | Admitting: Physical Therapy

## 2015-10-03 ENCOUNTER — Encounter (HOSPITAL_COMMUNITY): Payer: Self-pay | Admitting: Occupational Therapy

## 2015-10-03 ENCOUNTER — Ambulatory Visit (HOSPITAL_COMMUNITY): Payer: Medicare Other | Admitting: Occupational Therapy

## 2015-10-03 DIAGNOSIS — M25612 Stiffness of left shoulder, not elsewhere classified: Secondary | ICD-10-CM

## 2015-10-03 DIAGNOSIS — R262 Difficulty in walking, not elsewhere classified: Secondary | ICD-10-CM | POA: Diagnosis not present

## 2015-10-03 DIAGNOSIS — M6281 Muscle weakness (generalized): Secondary | ICD-10-CM

## 2015-10-03 DIAGNOSIS — M25511 Pain in right shoulder: Secondary | ICD-10-CM

## 2015-10-03 DIAGNOSIS — M25512 Pain in left shoulder: Secondary | ICD-10-CM | POA: Diagnosis not present

## 2015-10-03 DIAGNOSIS — R6889 Other general symptoms and signs: Secondary | ICD-10-CM | POA: Diagnosis not present

## 2015-10-03 DIAGNOSIS — R2681 Unsteadiness on feet: Secondary | ICD-10-CM

## 2015-10-03 DIAGNOSIS — M25611 Stiffness of right shoulder, not elsewhere classified: Secondary | ICD-10-CM

## 2015-10-03 DIAGNOSIS — M629 Disorder of muscle, unspecified: Secondary | ICD-10-CM

## 2015-10-03 DIAGNOSIS — M6289 Other specified disorders of muscle: Secondary | ICD-10-CM

## 2015-10-03 NOTE — Therapy (Signed)
[place], [place], [address] Phone: [phone]   Fax:  [phone]  Physical Therapy Treatment (Re-Assessment)  Patient Details  Name: Jeremy Parrish MRN: [id number] Date of Birth: 07/11/1941 Referring Provider: Patrici Ranks   Encounter Date: 10/03/2015      PT End of Session - 10/03/15 1146    Visit Number 9   Number of Visits 13   Date for PT Re-Evaluation 10/31/15   Authorization Type Medicare and BCBS    Authorization Time Period 09/05/15 to 11/05/15   Authorization - Visit Number 9   Authorization - Number of Visits 19   PT Start Time 1100   PT Stop Time 1139   PT Time Calculation (min) 39 min   Activity Tolerance Patient tolerated treatment well   Behavior During Therapy [facility] for tasks assessed/performed      Past Medical History  Diagnosis Date  . Hypertension   . Heart disease   . Kidney stones     history  . Lung mass   . Heart murmur   . 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 Coronary artery disease   . Depression   . Shortness of breath   . Peripheral arterial disease ([place])   . Bladder neck contracture   . Personal history of other diseases of circulatory system   . Aortic aneurysm of unspecified site without mention of rupture   . Arthritis   . Intestinovesical fistula   . Esophageal reflux   . Hyperlipidemia   . Anemia   . CHF (congestive heart failure) ([place])   . COPD (chronic obstructive pulmonary disease) ([place])   . Cerebral atherosclerosis     Carotid Doppler, 02/16/2013 - Bilateral Proximal ICAs,demonstrate mild plaque w/o evidence of significant diameter reduction, dissection, or any other vascular abnormality  . Complication of anesthesia   . PONV (postoperative nausea and vomiting)   . Stroke [facility]) 2013    Speech.  . Hx of bladder cancer 10/07/2012  . Sleep apnea     05-02-14 cpap , not yet used-  suggested settings 5  . Cancer ([place])   . Prostate cancer ([place]) 2000  . Multiple myeloma   . Non Hodgkin's lymphoma ([place])   . Myocardial infarction [facility])     '96    Past Surgical History  Procedure Laterality Date  . Prostate surgery    . Heart stents x 5  1999  . Portacath placement  07/26/2009    right chest  . Wrist surgery      right  . Left ear skin cancer removed    . Bone marrow transplant  2011  . Pacemaker insertion  07/22/2011    Medtronic  . Coronary angioplasty  06/24/2000    PCI and stenting in mid & proximal RCA  . Insert / replace / remove pacemaker    . Tee without cardioversion  10/13/2012    Procedure: TRANSESOPHAGEAL ECHOCARDIOGRAM (TEE);  Surgeon: Sanda Klein, MD;  Location: [facility] ENDOSCOPY;  Service: Cardiovascular;  Laterality: N/A;  pat/kay/echo notified  . Colonoscopy N/A 01/01/2013    Procedure: COLONOSCOPY;  Surgeon: Rogene Houston, MD;  [facility] ENDO [facility];  Service: Endoscopy;  Laterality: N/A;  825-moved to [place] notified pt  . Rotator    . Rotator cuff repair Right   . Colon surgery      colon resection  . Bladder surgery    .  Shoulder arthroscopy with subacromial decompression Right 07/21/2013    Procedure: RIGHT SHOULDER ARTHROSCOPY WITH SUBACROMIAL DECOMPRESSION AND DEBRIDEMENT & Injection of Left Shoulder;  Surgeon: Alta Corning, MD;  Location: [place];  Service: Orthopedics;  Laterality: Right;  . US echocardiography  06/19/2011    RV mildly dilated,mild to mod. MR,mild AI,mild PI  . Nm myocar perf wall motion  11/27/2007    inferior scar  . Inguinal hernia repair Right 05/04/2014    Procedure: OPEN RIGHT INGUINAL HERNIA REPAIR with mesh;  Surgeon: Edward Jolly, MD;  Location: [facility] ORS;  Service: General;  Laterality: Right;    There were no vitals filed for this visit.  Visit Diagnosis:  Muscle weakness (generalized)  Difficulty walking  Unsteadiness  Decreased functional activity tolerance      Subjective Assessment -  10/03/15 1101    Subjective Patient reports that he is doing well, just sore this morning from raking so much   Pertinent History Patient had a stroke in 2011-2012; he did receive therapy after this stroke, including PT and Speech. No falls recently, no real close calls. He does have a pacemaker and history of an aortic anuerysm. He states that he feels like there might be something different going on with his body.    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    Currently in Pain? Yes   Pain Score 8    Pain Location Shoulder   Pain Orientation Right;Left            [facility] PT Assessment - 10/03/15 0001    AROM   Right Ankle Dorsiflexion 14   Left Ankle Dorsiflexion 12   Strength   Right Hip Flexion 5/5   Right Hip Extension 4-/5   Right Hip ABduction 4+/5   Left Hip Flexion 5/5   Left Hip Extension 4-/5   Left Hip ABduction 4-/5   Right Knee Flexion 5/5   Right Knee Extension 5/5   Left Knee Flexion 4+/5   Left Knee Extension 4+/5   Right Ankle Dorsiflexion 5/5   Left Ankle Dorsiflexion 5/5   6 minute walk test results    Aerobic Endurance Distance Walked 4818   Endurance additional comments gait speed 1.69m/s    Berg Balance Test   Sit to Stand Able to stand without using hands and stabilize independently   Standing Unsupported Able to stand safely 2 minutes   Sitting with Back Unsupported but Feet Supported on Floor or Stool Able to sit safely and securely 2 minutes   Stand to Sit Sits safely with minimal use of hands   Transfers Able to transfer safely, minor use of hands   Standing Unsupported with Eyes Closed Able to stand 10 seconds safely   Standing Ubsupported with Feet Together Able to place feet together independently and stand 1 minute safely   From Standing, Reach Forward with Outstretched Arm Can reach confidently >25 cm (10")   From Standing Position, Pick up Object from Floor Able to  pick up shoe safely and easily   From Standing Position, Turn to Look Behind Over each Shoulder Looks behind from both sides and weight shifts well   Turn 360 Degrees Able to turn 360 degrees safely in 4 seconds or less   Standing Unsupported, Alternately Place Feet on Step/Stool Able to stand independently and safely and complete 8 steps in 20 seconds   Standing Unsupported, One Foot in [place] to  plae foot ahead of the other independently and hold 30 seconds   Standing on One Leg Able to lift leg independently and hold equal to or more than 3 seconds   Total Score 53                     [facility] Adult PT Treatment/Exercise - 10/03/15 0001    Knee/Hip Exercises: Stretches   Active Hamstring Stretch Both;2 reps;30 seconds   Active Hamstring Stretch Limitations 12 inch step    Piriformis Stretch Both;2 reps;30 seconds   Piriformis Stretch Limitations seated   Gastroc Stretch 3 reps;30 seconds   Gastroc Stretch Limitations slantboard                 PT Education - 10/03/15 1145    Education provided Yes   Education Details progress with skilled PT services, plan of care moving forward    Person(s) Educated Patient   Methods Explanation   Comprehension Verbalized understanding          PT Short Term Goals - 10/03/15 1134    PT SHORT TERM GOAL #1   Title Patient will improve 6 minute walk to at least 1267ft with no rest breaks and no noticeable decrease in speed/increase in fatigue towards latter part of gait distance    Baseline 11/22- 1223ft with no decrease in speed    Time 3   Period Weeks   Status Achieved   PT SHORT TERM GOAL #2   Title Patient to score at least 52 on BERG balance test to demonstrate reduced fall risk during functional activities    Baseline 11/22- scored 53   Time 3   Period Weeks   Status Achieved   PT SHORT TERM GOAL #3   Title Patient will be independent in correctly and consistently performing appropriate HEP, to be updated PRN     Time 3   Period Weeks   Status Achieved           PT Long Term Goals - 10/03/15 1135    PT LONG TERM GOAL #1   Title Patient will demonstrate 5/5 strength in all muslces tested in order to promote enhanced balance and functional endurance with reduced fall risk    Time 6   Period Weeks   Status On-going   PT LONG TERM GOAL #2   Title Patient will score at least a 54 on the BERG balance test in order to demonstrate significantly reduced fall risk during all functional situations    Time 6   Period Weeks   Status On-going   PT LONG TERM GOAL #3   Title Patient will be able to ambulate at least 1423ft duriung the 6 minute walk test in order to demonstrate improved functional actvity tolerance for improved performance in functional situations    Time 6   Period Weeks   Status On-going   PT LONG TERM GOAL #4   Title Patient to be performing at least 20 minutes of light-moderate intensity exercise at least 3 days per week in [place] to maintain functional gains and promote overall improved healthy lifestyle    Time 6   Period Weeks   Status On-going               Plan - 10/03/15 1147    Clinical Impression Statement Re-assessment performed today. Patient shows improvement in functional balance, strength, and functional activity tolerance as evidenced by improvmeents in objective measures in all these areas. However, patietn  does state that he is concerned with his balance especially on stairs and also feels that his knees are a bit weak especially also on stairs, and would like to finish up his scheduled visits with skilled PT services to address these factors. At this time patient will benefit from a brief continuation of skilled PT services in order to finetune strength and functional balance before DC to advanced HEP.    Pt will benefit from skilled therapeutic intervention in order to improve on the following deficits Abnormal gait;Hypomobility;Cardiopulmonary status  limiting activity;Decreased activity tolerance;Decreased strength;Impaired UE functional use;Decreased balance;Decreased mobility;Difficulty walking;Improper body mechanics;Decreased coordination;Impaired flexibility;Postural dysfunction   Rehab Potential Good   PT Frequency Other (comment)  4 more visits    PT Duration Other (comment)  4 more visits    PT Treatment/Interventions ADLs/Self Care Home Management;Gait training;Therapeutic activities;Therapeutic exercise;Balance training;Neuromuscular re-education;Patient/family education;Manual techniques;Energy conservation   PT Next Visit Plan Focus on knee strength and general steadiness on stairs/uneven surfaces; DC to advanced HEP in 4 more sessions    PT Home Exercise Plan given    Consulted and Agree with Plan of Care Patient          G-Codes - 15-Oct-2015 1151    Functional Assessment Tool Used Based on skilled clinical assessment of gait, balance, functional activity tolerance, strength    Functional Limitation Mobility: Walking and moving around   Mobility: Walking and Moving Around Current Status (E0100) At least 20 percent but less than 40 percent impaired, limited or restricted   Mobility: Walking and Moving Around Goal Status [phone]) At least 20 percent but less than 40 percent impaired, limited or restricted      Problem List Patient Active Problem List   Diagnosis Date Noted  . Hypokalemia 11/03/2014  . URI (upper respiratory infection) 11/03/2014  . Weakness 11/03/2014  . Obstructive sleep apnea 08/07/2014  . Central sleep apnea 08/07/2014  . Inguinal hernia 05/04/2014  . Chest pain with moderate risk for cardiac etiology 11/25/2013  . Fever 09/13/2013  . Pneumonia 09/13/2013  . Pancytopenia ([place]) 09/13/2013  . Muscle weakness (generalized) 08/04/2013  . Status post arthroscopy of shoulder 08/04/2013  . Pain in joint, shoulder region 08/04/2013  . Rotator cuff tear arthropathy of right shoulder 07/21/2013  . Left  rotator cuff tear arthropathy 07/21/2013  . Shortness of breath 07/07/2013  . 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 Hyperlipidemia with target LDL less than 100 10/08/2012  . Prediabetes 10/08/2012  . Expressive aphasia 10/08/2012  . Hemiplegia affecting right dominant side ([place]) 10/08/2012  . 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 Hx of bladder cancer 10/07/2012  . 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 ASTHMA, UNSPECIFIED 03/22/2010  . Nonspecific (abnormal) findings on radiological and other examination of body structure 03/22/2010  . Hx of lymphoma 03/21/2010  . Multiple myeloma in remission ([place]) 03/21/2010  . Anxiety state 03/21/2010  . Essential hypertension 03/21/2010  . MYOCARDIAL INFARCTION 03/21/2010  . Coronary atherosclerosis 03/21/2010  . NEPHROLITHIASIS 03/21/2010  . ELEVATED PROSTATE SPECIFIC ANTIGEN 03/21/2010  . ROTATOR CUFF REPAIR, RIGHT, HX OF 03/21/2010    Physical Therapy Progress Note  Dates of Reporting Period: 09/05/15 to 2015-10-15  Objective Reports of Subjective Statement: see above   Objective Measurements: see above   Goal Update: see above   Plan: see above   Reason Skilled Services are Required: focus on steadiness on uneven  surfaces and stairs, knee strength, advanced HEP before DC in 4 sessions     Deniece Ree PT, DPT [place] [address] [place], [place], [address] Phone: [phone]   Fax:  [phone]  Name: Jeremy Parrish MRN: [id number] Date of Birth: 1941/10/30

## 2015-10-03 NOTE — Therapy (Signed)
[place], [place], [address] Phone: [phone]   Fax:  [phone]  Occupational Therapy Treatment  Patient Details  Name: Jeremy Parrish MRN: [id number] Date of Birth: July 23, 1941 Referring Provider: Dr. Whitney Muse  Encounter Date: 10/03/2015      OT End of Session - 10/03/15 1224    Visit Number 2   Number of Visits 12   Date for OT Re-Evaluation 11/20/15  Mini reassessment 10/19/2015   Authorization Type Medicare   Authorization Time Period Before 10th visit   Authorization - Visit Number 2   Authorization - Number of Visits 10   OT Start Time 1140   OT Stop Time 1221   OT Time Calculation (min) 41 min   Activity Tolerance Patient tolerated treatment well   Behavior During Therapy [facility] for tasks assessed/performed      Past Medical History  Diagnosis Date  . Hypertension   . Heart disease   . Kidney stones     history  . Lung mass   . Heart murmur   . 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 Coronary artery disease   . Depression   . Shortness of breath   . Peripheral arterial disease ([place])   . Bladder neck contracture   . Personal history of other diseases of circulatory system   . Aortic aneurysm of unspecified site without mention of rupture   . Arthritis   . Intestinovesical fistula   . Esophageal reflux   . Hyperlipidemia   . Anemia   . CHF (congestive heart failure) ([place])   . COPD (chronic obstructive pulmonary disease) ([place])   . Cerebral atherosclerosis     Carotid Doppler, 02/16/2013 - Bilateral Proximal ICAs,demonstrate mild plaque w/o evidence of significant diameter reduction, dissection, or any other vascular abnormality  . Complication of anesthesia   . PONV (postoperative nausea and vomiting)   . Stroke [facility]) 2013    Speech.  . Hx of bladder cancer 10/07/2012  . Sleep apnea     05-02-14 cpap , not yet used-  suggested settings 5  . Cancer ([place])   . Prostate cancer ([place]) 2000  . Multiple myeloma   . Non Hodgkin's lymphoma ([place])   . Myocardial infarction [facility])     '96    Past Surgical History  Procedure Laterality Date  . Prostate surgery    . Heart stents x 5  1999  . Portacath placement  07/26/2009    right chest  . Wrist surgery      right  . Left ear skin cancer removed    . Bone marrow transplant  2011  . Pacemaker insertion  07/22/2011    Medtronic  . Coronary angioplasty  06/24/2000    PCI and stenting in mid & proximal RCA  . Insert / replace / remove pacemaker    . Tee without cardioversion  10/13/2012    Procedure: TRANSESOPHAGEAL ECHOCARDIOGRAM (TEE);  Surgeon: Sanda Klein, MD;  Location: [facility] ENDOSCOPY;  Service: Cardiovascular;  Laterality: N/A;  pat/kay/echo notified  . Colonoscopy N/A 01/01/2013    Procedure: COLONOSCOPY;  Surgeon: Rogene Houston, MD;  [facility] ENDO [facility];  Service: Endoscopy;  Laterality: N/A;  825-moved to [place] notified pt  . Rotator    . Rotator cuff repair Right   . Colon surgery      colon resection  . Bladder surgery    . Shoulder  arthroscopy with subacromial decompression Right 07/21/2013    Procedure: RIGHT SHOULDER ARTHROSCOPY WITH SUBACROMIAL DECOMPRESSION AND DEBRIDEMENT & Injection of Left Shoulder;  Surgeon: Alta Corning, MD;  Location: [place];  Service: Orthopedics;  Laterality: Right;  . US echocardiography  06/19/2011    RV mildly dilated,mild to mod. MR,mild AI,mild PI  . Nm myocar perf wall motion  11/27/2007    inferior scar  . Inguinal hernia repair Right 05/04/2014    Procedure: OPEN RIGHT INGUINAL HERNIA REPAIR with mesh;  Surgeon: Edward Jolly, MD;  [facility] ORS;  Service: General;  Laterality: Right;    There were no vitals filed for this visit.  Visit Diagnosis:  Pain of both shoulder joints  Stiffness of shoulder joint, left  Stiffness of shoulder joint, right  Tight fascia  Muscle weakness       Subjective Assessment - 10/03/15 1141    Subjective  S: They've been hurting this morning, I took some Ibuprofen.    Currently in Pain? Yes   Pain Score 5    Pain Location Shoulder   Pain Orientation Right;Left   Pain Descriptors / Indicators Aching   Pain Type Chronic pain            [facility] OT Assessment - 10/03/15 1224    Assessment   Diagnosis Hx of CVA-shoulder pain   Precautions   Precautions None                  OT Treatments/Exercises (OP) - 10/03/15 1141    Exercises   Exercises Shoulder   Shoulder Exercises: Supine   Protraction PROM;AROM;10 reps;Both   Horizontal ABduction PROM;AROM;10 reps;Both   External Rotation PROM;AROM;10 reps;Both   Internal Rotation PROM;AROM;10 reps;Both   Flexion PROM;AROM;10 reps;Both   ABduction PROM;AROM;10 reps;Both   Shoulder Exercises: Standing   Other Standing Exercises PVC sliding exercise, BUE, 10X each, to work on scapular mobility and functional reaching   Shoulder Exercises: Pulleys   Flexion 1 minute   ABduction 1 minute   Shoulder Exercises: ROM/Strengthening   Proximal Shoulder Strengthening, Supine 10X each 1 rest break   Manual Therapy   Manual Therapy Myofascial release   Manual therapy comments Manual therapy completed prior to therapeutic exercises   Myofascial Release Myofascial release to bilateral upper arms, trapezius, and scapularis regions to decrease pain and fascial restrictions, increase joint mobility.                   OT Short Term Goals - 10/03/15 1227    OT SHORT TERM GOAL #1   Title Pt will be educated on and independent in HEP.    Time 6   Period Weeks   Status On-going   OT SHORT TERM GOAL #2   Title Pt will decrease fascial restrictions from mod to min amounts in BUE to decrease pain and increase range of motion.   Time 6   Period Weeks   Status On-going   OT SHORT TERM GOAL #3   Title Pt will decrease pain to 3/10 or less in BUE to increase ability to use during  daily tasks.    Time 6   Period Weeks   Status On-going   OT SHORT TERM GOAL #4   Title Pt will increase A/ROM to [facility] to increase ability to reach into high cabinets during daily tasks.    Time 6   Period Weeks   Status On-going   OT SHORT TERM GOAL #5   Title  Pt will increase strength to 4+/5 to increase ability to complete gardening and household chores using BUE.    Time 6   Period Weeks   Status On-going                  Plan - 10/03/15 1225    Clinical Impression Statement A: Initiated myofascial release, manual stretching, A/ROM exercises, and pulleys this session. Pt reports mild discomfort along bilateral upper arms during abduction. Pt required minimal verbal cuing for form and technique. Rest breaks provided as needed for fatigue.    Plan P: Complete FOTO. Add A/ROM exercises in standing.         Problem List Patient Active Problem List   Diagnosis Date Noted  . Hypokalemia 11/03/2014  . URI (upper respiratory infection) 11/03/2014  . Weakness 11/03/2014  . Obstructive sleep apnea 08/07/2014  . Central sleep apnea 08/07/2014  . Inguinal hernia 05/04/2014  . Chest pain with moderate risk for cardiac etiology 11/25/2013  . Fever 09/13/2013  . Pneumonia 09/13/2013  . Pancytopenia ([place]) 09/13/2013  . Muscle weakness (generalized) 08/04/2013  . Status post arthroscopy of shoulder 08/04/2013  . Pain in joint, shoulder region 08/04/2013  . Rotator cuff tear arthropathy of right shoulder 07/21/2013  . Left rotator cuff tear arthropathy 07/21/2013  . Shortness of breath 07/07/2013  . 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 Hyperlipidemia with target LDL less than 100 10/08/2012  . Prediabetes 10/08/2012  . Expressive aphasia 10/08/2012  . Hemiplegia affecting right dominant side ([place]) 10/08/2012  . 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 Hx of bladder cancer 10/07/2012  .  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 ASTHMA, UNSPECIFIED 03/22/2010  . Nonspecific (abnormal) findings on radiological and other examination of body structure 03/22/2010  . Hx of lymphoma 03/21/2010  . Multiple myeloma in remission ([place]) 03/21/2010  . Anxiety state 03/21/2010  . Essential hypertension 03/21/2010  . MYOCARDIAL INFARCTION 03/21/2010  . Coronary atherosclerosis 03/21/2010  . NEPHROLITHIASIS 03/21/2010  . ELEVATED PROSTATE SPECIFIC ANTIGEN 03/21/2010  . ROTATOR CUFF REPAIR, RIGHT, HX OF 03/21/2010   Guadelupe Sabin, OTR/L  [phone]  10/03/2015, 12:28 PM  [place] [address] [place], [place], [address] Phone: [phone]   Fax:  [phone]  Name: Jeremy Parrish MRN: [id number] Date of Birth: 11/19/1940

## 2015-10-04 ENCOUNTER — Other Ambulatory Visit (HOSPITAL_COMMUNITY): Payer: Self-pay | Admitting: Oncology

## 2015-10-04 DIAGNOSIS — C9001 Multiple myeloma in remission: Secondary | ICD-10-CM

## 2015-10-04 MED ORDER — LENALIDOMIDE 10 MG PO CAPS: 10.0000 mg | ORAL_CAPSULE | Freq: Every day | ORAL | Status: DC

## 2015-10-09 ENCOUNTER — Other Ambulatory Visit: Payer: Self-pay | Admitting: Nurse Practitioner

## 2015-10-09 MED ORDER — VALACYCLOVIR HCL 1 G PO TABS: 1000.0000 mg | ORAL_TABLET | Freq: Three times a day (TID) | ORAL | Status: DC

## 2015-10-11 ENCOUNTER — Ambulatory Visit (HOSPITAL_COMMUNITY): Payer: Medicare Other

## 2015-10-11 ENCOUNTER — Ambulatory Visit (HOSPITAL_COMMUNITY): Payer: Medicare Other | Admitting: Specialist

## 2015-10-11 DIAGNOSIS — M25511 Pain in right shoulder: Secondary | ICD-10-CM | POA: Diagnosis not present

## 2015-10-11 DIAGNOSIS — R6889 Other general symptoms and signs: Secondary | ICD-10-CM

## 2015-10-11 DIAGNOSIS — R262 Difficulty in walking, not elsewhere classified: Secondary | ICD-10-CM

## 2015-10-11 DIAGNOSIS — M6281 Muscle weakness (generalized): Secondary | ICD-10-CM | POA: Diagnosis not present

## 2015-10-11 DIAGNOSIS — M25512 Pain in left shoulder: Secondary | ICD-10-CM | POA: Diagnosis not present

## 2015-10-11 DIAGNOSIS — M25611 Stiffness of right shoulder, not elsewhere classified: Secondary | ICD-10-CM

## 2015-10-11 DIAGNOSIS — R2681 Unsteadiness on feet: Secondary | ICD-10-CM | POA: Diagnosis not present

## 2015-10-11 DIAGNOSIS — M25612 Stiffness of left shoulder, not elsewhere classified: Secondary | ICD-10-CM

## 2015-10-11 DIAGNOSIS — M629 Disorder of muscle, unspecified: Secondary | ICD-10-CM

## 2015-10-11 DIAGNOSIS — M6289 Other specified disorders of muscle: Secondary | ICD-10-CM

## 2015-10-11 NOTE — Therapy (Signed)
[place], [place], [address] Phone: [phone]   Fax:  [phone]  Occupational Therapy Treatment  Patient Details  Name: Jeremy Parrish MRN: [id number] Date of Birth: 06-11-1941 Referring Provider: Dr. Whitney Muse  Encounter Date: 10/11/2015      OT End of Session - 10/11/15 1124    Visit Number 3   Number of Visits 12   Date for OT Re-Evaluation 11/20/15  mini reassessment 10/19/15   Authorization Type Medicare   Authorization Time Period Before 10th visit   Authorization - Visit Number 3   Authorization - Number of Visits 10   OT Start Time 1025   OT Stop Time 1100   OT Time Calculation (min) 35 min   Activity Tolerance Patient tolerated treatment well   Behavior During Therapy [facility] for tasks assessed/performed      Past Medical History  Diagnosis Date  . Hypertension   . Heart disease   . Kidney stones     history  . Lung mass   . Heart murmur   . 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 Coronary artery disease   . Depression   . Shortness of breath   . Peripheral arterial disease ([place])   . Bladder neck contracture   . Personal history of other diseases of circulatory system   . Aortic aneurysm of unspecified site without mention of rupture   . Arthritis   . Intestinovesical fistula   . Esophageal reflux   . Hyperlipidemia   . Anemia   . CHF (congestive heart failure) ([place])   . COPD (chronic obstructive pulmonary disease) ([place])   . Cerebral atherosclerosis     Carotid Doppler, 02/16/2013 - Bilateral Proximal ICAs,demonstrate mild plaque w/o evidence of significant diameter reduction, dissection, or any other vascular abnormality  . Complication of anesthesia   . PONV (postoperative nausea and vomiting)   . Stroke [facility]) 2013    Speech.  . Hx of bladder cancer 10/07/2012  . Sleep apnea     05-02-14 cpap , not yet used-  suggested settings 5  . Cancer ([place])   . Prostate cancer ([place]) 2000  . Multiple myeloma   . Non Hodgkin's lymphoma ([place])   . Myocardial infarction [facility]     '96    Past Surgical History  Procedure Laterality Date  . Prostate surgery    . Heart stents x 5  1999  . Portacath placement  07/26/2009    right chest  . Wrist surgery      right  . Left ear skin cancer removed    . Bone marrow transplant  2011  . Pacemaker insertion  07/22/2011    Medtronic  . Coronary angioplasty  06/24/2000    PCI and stenting in mid & proximal RCA  . Insert / replace / remove pacemaker    . Tee without cardioversion  10/13/2012    Procedure: TRANSESOPHAGEAL ECHOCARDIOGRAM (TEE);  Surgeon: Sanda Klein, MD;  Location: [facility] ENDOSCOPY;  Service: Cardiovascular;  Laterality: N/A;  pat/kay/echo notified  . Colonoscopy N/A 01/01/2013    Procedure: COLONOSCOPY;  Surgeon: Rogene Houston, MD;  [facility]O [facility];  Service: Endoscopy;  Laterality: N/A;  825-moved to [place] notified pt  . Rotator    . Rotator cuff repair Right   . Colon surgery      colon resection  . Bladder surgery    . Shoulder  arthroscopy with subacromial decompression Right 07/21/2013    Procedure: RIGHT SHOULDER ARTHROSCOPY WITH SUBACROMIAL DECOMPRESSION AND DEBRIDEMENT & Injection of Left Shoulder;  Surgeon: Alta Corning, MD;  Location: [facility];  Service: Orthopedics;  Laterality: Right;  . US echocardiography  06/19/2011    RV mildly dilated,mild to mod. MR,mild AI,mild PI  . Nm myocar perf wall motion  11/27/2007    inferior scar  . Inguinal hernia repair Right 05/04/2014    Procedure: OPEN RIGHT INGUINAL HERNIA REPAIR with mesh;  Surgeon: Edward Jolly, MD;  Location: [facility] ORS;  Service: General;  Laterality: Right;    There were no vitals filed for this visit.  Visit Diagnosis:  Pain of both shoulder joints  Stiffness of shoulder joint, left  Stiffness of shoulder joint, right  Tight fascia  Muscle weakness       Subjective Assessment - 10/11/15 1027    Subjective  S:  The shoudlers are a little tired because of riding the bike.   Currently in Pain? Yes   Pain Score 4    Pain Location Shoulder   Pain Orientation Right;Left   Pain Descriptors / Indicators Aching   Pain Type Chronic pain            [facility] OT Assessment - 10/11/15 1027    Assessment   Diagnosis Hx of CVA-shoulder pain   Precautions   Precautions None                  OT Treatments/Exercises (OP) - 10/11/15 1049    Exercises   Exercises Shoulder   Shoulder Exercises: Supine   Protraction PROM;10 reps;AAROM;15 reps;Both   Horizontal ABduction PROM;10 reps;AROM;15 reps;Both   External Rotation 10 reps;PROM;AROM;15 reps;Both   Internal Rotation PROM;10 reps;AROM;15 reps;Both   Flexion PROM;10 reps;AROM;15 reps;Both   ABduction PROM;10 reps;AROM;15 reps;Both   Shoulder Exercises: ROM/Strengthening   Proximal Shoulder Strengthening, Supine 10 X each without resting   Other ROM/Strengthening Exercises flext to 90 holding dowel rod with both hands to 90 hold 10 seconds to 120 hold 10 seconds to 170 hold 10 seconds             Balance Exercises - 10/11/15 1103    Balance Exercises: Standing   SLS with Vectors Solid surface;3 reps  Lt 7", Rt 10" max of 3   Balance Beam Tandem, retro and sidestepping 2RT;  Tandem with 6 and 12in hurdles   Step Over Hurdles / Cones 6 and 12in hurdles 2RT             OT Short Term Goals - 10/03/15 1227    OT SHORT TERM GOAL #1   Title Pt will be educated on and independent in HEP.    Time 6   Period Weeks   Status On-going   OT SHORT TERM GOAL #2   Title Pt will decrease fascial restrictions from mod to min amounts in BUE to decrease pain and increase range of motion.   Time 6   Period Weeks   Status On-going   OT SHORT TERM GOAL #3   Title Pt will decrease pain to 3/10 or less in BUE to increase ability to use during daily tasks.    Time 6   Period Weeks    Status On-going   OT SHORT TERM GOAL #4   Title Pt will increase A/ROM to [facility] to increase ability to reach into high cabinets during daily tasks.    Time 6   Period Weeks  Status On-going   OT SHORT TERM GOAL #5   Title Pt will increase strength to 4+/5 to increase ability to complete gardening and household chores using BUE.    Time 6   Period Weeks   Status On-going                  Plan - 10/11/15 1125    Clinical Impression Statement A:  Patient has difficulty with full range with flexion and abduction above 90, completed A/AROM flexion in seated and then was able to complete 3 repetitions of A/ROM through full range.    Plan P:  complete DASH for quality outcomes, attempt A/ROM of flexion in seated, focus on proximal shoulder strengthening to improve abiltiy to complete overhead activities.         Problem List Patient Active Problem List   Diagnosis Date Noted  . Hypokalemia 11/03/2014  . URI (upper respiratory infection) 11/03/2014  . Weakness 11/03/2014  . Obstructive sleep apnea 08/07/2014  . Central sleep apnea 08/07/2014  . Inguinal hernia 05/04/2014  . Chest pain with moderate risk for cardiac etiology 11/25/2013  . Fever 09/13/2013  . Pneumonia 09/13/2013  . Pancytopenia ([place]) 09/13/2013  . Muscle weakness (generalized) 08/04/2013  . Status post arthroscopy of shoulder 08/04/2013  . Pain in joint, shoulder region 08/04/2013  . Rotator cuff tear arthropathy of right shoulder 07/21/2013  . Left rotator cuff tear arthropathy 07/21/2013  . Shortness of breath 07/07/2013  . 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 Hyperlipidemia with target LDL less than 100 10/08/2012  . Prediabetes 10/08/2012  . Expressive aphasia 10/08/2012  . Hemiplegia affecting right dominant side ([place]) 10/08/2012  . 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 Hx of bladder cancer 10/07/2012  . Lt  CVA with expressive aphasia Nov 2013 10/06/2012  . 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 ASTHMA, UNSPECIFIED 03/22/2010  . Nonspecific (abnormal) findings on radiological and other examination of body structure 03/22/2010  . Hx of lymphoma 03/21/2010  . Multiple myeloma in remission ([place]) 03/21/2010  . Anxiety state 03/21/2010  . Essential hypertension 03/21/2010  . MYOCARDIAL INFARCTION 03/21/2010  . Coronary atherosclerosis 03/21/2010  . NEPHROLITHIASIS 03/21/2010  . ELEVATED PROSTATE SPECIFIC ANTIGEN 03/21/2010  . ROTATOR CUFF REPAIR, RIGHT, HX OF 03/21/2010    Vangie Bicker, OTR/L [phone]  10/11/2015, 11:28 AM  [facility] [address] [place], [place], [address] Phone: [phone]   Fax:  [phone]  Name: Jeremy Parrish MRN: [id number] Date of Birth: [birth year]

## 2015-10-11 NOTE — Therapy (Signed)
[place], [place], [address] Phone: [phone]   Fax:  [phone]  Physical Therapy Treatment  Patient Details  Name: Jeremy Parrish MRN: [id number] Date of Birth: 26-Mar-1941 Referring Provider: Patrici Ranks   Encounter Date: 10/11/2015      PT End of Session - 10/11/15 0935    Visit Number 10   Number of Visits 13   Date for PT Re-Evaluation 10/31/15   Authorization Type Medicare and BCBS    Authorization Time Period 09/05/15 to 11/05/15   Authorization - Visit Number 10   Authorization - Number of Visits 19   PT Start Time 0934   PT Stop Time 1024   PT Time Calculation (min) 50 min   Equipment Utilized During Treatment Gait belt   Activity Tolerance Patient tolerated treatment well   Behavior During Therapy [facility] for tasks assessed/performed      Past Medical History  Diagnosis Date  . Hypertension   . Heart disease   . Kidney stones     history  . Lung mass   . Heart murmur   . 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 Coronary artery disease   . Depression   . Shortness of breath   . Peripheral arterial disease ([place])   . Bladder neck contracture   . Personal history of other diseases of circulatory system   . Aortic aneurysm of unspecified site without mention of rupture   . Arthritis   . Intestinovesical fistula   . Esophageal reflux   . Hyperlipidemia   . Anemia   . CHF (congestive heart failure) ([place])   . COPD (chronic obstructive pulmonary disease) ([place])   . Cerebral atherosclerosis     Carotid Doppler, 02/16/2013 - Bilateral Proximal ICAs,demonstrate mild plaque w/o evidence of significant diameter reduction, dissection, or any other vascular abnormality  . Complication of anesthesia   . PONV (postoperative nausea and vomiting)   . Stroke [facility]) 2013    Speech.  . Hx of bladder cancer 10/07/2012  . Sleep apnea      05-02-14 cpap , not yet used- suggested settings 5  . Cancer ([place])   . Prostate cancer ([place]) 2000  . Multiple myeloma   . Non Hodgkin's lymphoma ([place])   . Myocardial infarction [facility])     '96    Past Surgical History  Procedure Laterality Date  . Prostate surgery    . Heart stents x 5  1999  . Portacath placement  07/26/2009    right chest  . Wrist surgery      right  . Left ear skin cancer removed    . Bone marrow transplant  2011  . Pacemaker insertion  07/22/2011    Medtronic  . Coronary angioplasty  06/24/2000    PCI and stenting in mid & proximal RCA  . Insert / replace / remove pacemaker    . Tee without cardioversion  10/13/2012    Procedure: TRANSESOPHAGEAL ECHOCARDIOGRAM (TEE);  Surgeon: Sanda Klein, MD;  Location: [facility] ENDOSCOPY;  Service: Cardiovascular;  Laterality: N/A;  pat/kay/echo notified  . Colonoscopy N/A 01/01/2013    Procedure: COLONOSCOPY;  Surgeon: Rogene Houston, MD;  [facility]O [facility];  Service: Endoscopy;  Laterality: N/A;  825-moved to [place] notified pt  . Rotator    . Rotator cuff repair Right   . Colon surgery      colon resection  .  Bladder surgery    . Shoulder arthroscopy with subacromial decompression Right 07/21/2013    Procedure: RIGHT SHOULDER ARTHROSCOPY WITH SUBACROMIAL DECOMPRESSION AND DEBRIDEMENT & Injection of Left Shoulder;  Surgeon: Alta Corning, MD;  Location: [place];  Service: Orthopedics;  Laterality: Right;  . US echocardiography  06/19/2011    RV mildly dilated,mild to mod. MR,mild AI,mild PI  . Nm myocar perf wall motion  11/27/2007    inferior scar  . Inguinal hernia repair Right 05/04/2014    Procedure: OPEN RIGHT INGUINAL HERNIA REPAIR with mesh;  Surgeon: Edward Jolly, MD;  [facility] ORS;  Service: General;  Laterality: Right;    There were no vitals filed for this visit.  Visit Diagnosis:  Muscle weakness  Muscle weakness (generalized)  Difficulty walking  Unsteadiness  Decreased functional  activity tolerance      Subjective Assessment - 10/11/15 0924    Subjective Pt stated he is feeling good today, no reports of pain today   Currently in Pain? No/denies           [facility] Adult PT Treatment/Exercise - 10/11/15 0001    Knee/Hip Exercises: Aerobic   Nustep hill 3, level 3x 8'   Knee/Hip Exercises: Standing   Heel Raises 15 reps   Forward Lunges Both;15 reps   Forward Lunges Limitations 4" step   Side Lunges Both;10 reps   Side Lunges Limitations 4" step   Lateral Step Up Both;10 reps;Step Height: 6";Hand Hold: 1   Forward Step Up Both;15 reps;Hand Hold: 1;Step Height: 6"   Step Down Left;5 reps;Right;10 reps;Hand Hold: 1;Step Height: 4"   Step Down Limitations pain with Lt knee step down training   Functional Squat 15 reps   Rocker Board 2 minutes   Rocker Board Limitations R/L and A/P intermittent HHA   Other Standing Knee Exercises sidestep with green t-band x 2 RT              Balance Exercises - 10/11/15 1103    Balance Exercises: Standing   SLS with Vectors Solid surface;3 reps  Lt 7", Rt 10" max of 3   Balance Beam Tandem, retro and sidestepping 2RT;  Tandem with 6 and 12in hurdles   Step Over Hurdles / Cones 6 and 12in hurdles 2RT             PT Short Term Goals - 10/03/15 1134    PT SHORT TERM GOAL #1   Title Patient will improve 6 minute walk to at least 1235ft with no rest breaks and no noticeable decrease in speed/increase in fatigue towards latter part of gait distance    Baseline 11/22- 1264ft with no decrease in speed    Time 3   Period Weeks   Status Achieved   PT SHORT TERM GOAL #2   Title Patient to score at least 52 on BERG balance test to demonstrate reduced fall risk during functional activities    Baseline 11/22- scored 53   Time 3   Period Weeks   Status Achieved   PT SHORT TERM GOAL #3   Title Patient will be independent in correctly and consistently performing appropriate HEP, to be updated PRN    Time 3   Period  Weeks   Status Achieved           PT Long Term Goals - 10/03/15 1135    PT LONG TERM GOAL #1   Title Patient will demonstrate 5/5 strength in all muslces tested in order to promote  enhanced balance and functional endurance with reduced fall risk    Time 6   Period Weeks   Status On-going   PT LONG TERM GOAL #2   Title Patient will score at least a 54 on the BERG balance test in order to demonstrate significantly reduced fall risk during all functional situations    Time 6   Period Weeks   Status On-going   PT LONG TERM GOAL #3   Title Patient will be able to ambulate at least 1448ft duriung the 6 minute walk test in order to demonstrate improved functional actvity tolerance for improved performance in functional situations    Time 6   Period Weeks   Status On-going   PT LONG TERM GOAL #4   Title Patient to be performing at least 20 minutes of light-moderate intensity exercise at least 3 days per week in [place] to maintain functional gains and promote overall improved healthy lifestyle    Time 6   Period Weeks   Status On-going               Plan - 10/11/15 1258    Clinical Impression Statement Session focus on improving functional strengthening and dynamic balance training.  Began step down training for eccentric control, pt c/o pain anterior Lt knee and reps reduced folloiwing compliant.  Pt did require therapist facilitation for proper form and techniques with most exercises.Progressed balance activities with balance beam and hurdles combined, pt able to complete with min assistance.  SLS continues to be balance deficit.  Pt limited by fatigue, no reports of pain at end of session.     PT Next Visit Plan Focus on knee strength and general steadiness on stairs/uneven surfaces; DC to advanced HEP in 3 more sessions         Problem List Patient Active Problem List   Diagnosis Date Noted  . Hypokalemia 11/03/2014  . URI (upper respiratory infection) 11/03/2014  .  Weakness 11/03/2014  . Obstructive sleep apnea 08/07/2014  . Central sleep apnea 08/07/2014  . Inguinal hernia 05/04/2014  . Chest pain with moderate risk for cardiac etiology 11/25/2013  . Fever 09/13/2013  . Pneumonia 09/13/2013  . Pancytopenia ([place]) 09/13/2013  . Muscle weakness (generalized) 08/04/2013  . Status post arthroscopy of shoulder 08/04/2013  . Pain in joint, shoulder region 08/04/2013  . Rotator cuff tear arthropathy of right shoulder 07/21/2013  . Left rotator cuff tear arthropathy 07/21/2013  . Shortness of breath 07/07/2013  . 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 Hyperlipidemia with target LDL less than 100 10/08/2012  . Prediabetes 10/08/2012  . Expressive aphasia 10/08/2012  . Hemiplegia affecting right dominant side ([place]) 10/08/2012  . 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 Hx of bladder cancer 10/07/2012  . 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 ASTHMA, UNSPECIFIED 03/22/2010  . Nonspecific (abnormal) findings on radiological and other examination of body structure 03/22/2010  . Hx of lymphoma 03/21/2010  . Multiple myeloma in remission ([place]) 03/21/2010  . Anxiety state 03/21/2010  . Essential hypertension 03/21/2010  . MYOCARDIAL INFARCTION 03/21/2010  . Coronary atherosclerosis 03/21/2010  . NEPHROLITHIASIS 03/21/2010  . ELEVATED PROSTATE SPECIFIC ANTIGEN 03/21/2010  . ROTATOR CUFF REPAIR, RIGHT, HX OF 03/21/2010   Ihor Austin, LPTA; CBIS [phone]  Aldona Lento 10/11/2015, 6:44 PM  [facility] [address] [place], [place], [address]  Phone: [phone]   Fax:  [phone]  Name: Jeremy Parrish MRN: [id number] Date of Birth: February 11, 1941

## 2015-10-12 ENCOUNTER — Other Ambulatory Visit (HOSPITAL_COMMUNITY): Payer: Self-pay

## 2015-10-13 ENCOUNTER — Ambulatory Visit (HOSPITAL_COMMUNITY): Payer: Medicare Other | Admitting: Physical Therapy

## 2015-10-13 ENCOUNTER — Encounter: Payer: Self-pay | Admitting: Family Medicine

## 2015-10-13 ENCOUNTER — Ambulatory Visit (INDEPENDENT_AMBULATORY_CARE_PROVIDER_SITE_OTHER): Payer: Medicare Other | Admitting: Family Medicine

## 2015-10-13 ENCOUNTER — Encounter (HOSPITAL_COMMUNITY): Payer: Self-pay | Admitting: Occupational Therapy

## 2015-10-13 ENCOUNTER — Ambulatory Visit (HOSPITAL_COMMUNITY): Payer: Medicare Other | Attending: Hematology & Oncology | Admitting: Occupational Therapy

## 2015-10-13 VITALS — BP 122/72 | Temp 98.6°F | Ht 68.0 in | Wt 192.2 lb

## 2015-10-13 DIAGNOSIS — M25612 Stiffness of left shoulder, not elsewhere classified: Secondary | ICD-10-CM

## 2015-10-13 DIAGNOSIS — M25511 Pain in right shoulder: Secondary | ICD-10-CM | POA: Insufficient documentation

## 2015-10-13 DIAGNOSIS — R6889 Other general symptoms and signs: Secondary | ICD-10-CM

## 2015-10-13 DIAGNOSIS — R0600 Dyspnea, unspecified: Secondary | ICD-10-CM | POA: Diagnosis not present

## 2015-10-13 DIAGNOSIS — R262 Difficulty in walking, not elsewhere classified: Secondary | ICD-10-CM | POA: Insufficient documentation

## 2015-10-13 DIAGNOSIS — M6281 Muscle weakness (generalized): Secondary | ICD-10-CM | POA: Diagnosis not present

## 2015-10-13 DIAGNOSIS — I2510 Atherosclerotic heart disease of native coronary artery without angina pectoris: Secondary | ICD-10-CM | POA: Diagnosis not present

## 2015-10-13 DIAGNOSIS — M25512 Pain in left shoulder: Secondary | ICD-10-CM | POA: Insufficient documentation

## 2015-10-13 DIAGNOSIS — M25611 Stiffness of right shoulder, not elsewhere classified: Secondary | ICD-10-CM | POA: Diagnosis not present

## 2015-10-13 DIAGNOSIS — M629 Disorder of muscle, unspecified: Secondary | ICD-10-CM

## 2015-10-13 DIAGNOSIS — J019 Acute sinusitis, unspecified: Secondary | ICD-10-CM | POA: Diagnosis not present

## 2015-10-13 DIAGNOSIS — B9689 Other specified bacterial agents as the cause of diseases classified elsewhere: Secondary | ICD-10-CM

## 2015-10-13 DIAGNOSIS — R062 Wheezing: Secondary | ICD-10-CM

## 2015-10-13 DIAGNOSIS — R2681 Unsteadiness on feet: Secondary | ICD-10-CM | POA: Diagnosis not present

## 2015-10-13 DIAGNOSIS — M6289 Other specified disorders of muscle: Secondary | ICD-10-CM

## 2015-10-13 MED ORDER — CEFPROZIL 500 MG PO TABS: 500.0000 mg | ORAL_TABLET | Freq: Two times a day (BID) | ORAL | Status: DC

## 2015-10-13 MED ORDER — PREDNISONE 20 MG PO TABS: ORAL_TABLET | ORAL | Status: DC

## 2015-10-13 MED ORDER — BENZONATATE 100 MG PO CAPS: 100.0000 mg | ORAL_CAPSULE | Freq: Three times a day (TID) | ORAL | Status: DC | PRN

## 2015-10-13 MED ORDER — CEFTRIAXONE SODIUM 1 G IJ SOLR
500.0000 mg | Freq: Once | INTRAMUSCULAR | Status: AC
  Administered 2015-10-13: 500 mg via INTRAMUSCULAR

## 2015-10-13 MED ORDER — ALBUTEROL SULFATE (2.5 MG/3ML) 0.083% IN NEBU
2.5000 mg | INHALATION_SOLUTION | Freq: Once | RESPIRATORY_TRACT | Status: AC
  Administered 2015-10-13: 2.5 mg via RESPIRATORY_TRACT

## 2015-10-13 NOTE — Therapy (Signed)
[place], [place], [address] Phone: [phone]   Fax:  [phone]  Occupational Therapy Treatment  Patient Details  Name: Jeremy Parrish MRN: [id number] Date of Birth: 06/18/1941 Referring Provider: Dr. Whitney Muse  Encounter Date: 10/13/2015      OT End of Session - 10/13/15 1204    Visit Number 4   Number of Visits 12   Date for OT Re-Evaluation 11/20/15  mini reassessment 10/19/15   Authorization Type Medicare   Authorization Time Period Before 10th visit   Authorization - Visit Number 4   Authorization - Number of Visits 10   OT Start Time 1015   OT Stop Time 1100   OT Time Calculation (min) 45 min   Activity Tolerance Patient tolerated treatment well   Behavior During Therapy [facility] for tasks assessed/performed      Past Medical History  Diagnosis Date  . Hypertension   . Heart disease   . Kidney stones     history  . Lung mass   . Heart murmur   . 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 Coronary artery disease   . Depression   . Shortness of breath   . Peripheral arterial disease ([place])   . Bladder neck contracture   . Personal history of other diseases of circulatory system   . Aortic aneurysm of unspecified site without mention of rupture   . Arthritis   . Intestinovesical fistula   . Esophageal reflux   . Hyperlipidemia   . Anemia   . CHF (congestive heart failure) ([place])   . COPD (chronic obstructive pulmonary disease) ([place])   . Cerebral atherosclerosis     Carotid Doppler, 02/16/2013 - Bilateral Proximal ICAs,demonstrate mild plaque w/o evidence of significant diameter reduction, dissection, or any other vascular abnormality  . Complication of anesthesia   . PONV (postoperative nausea and vomiting)   . Stroke [facility]) 2013    Speech.  . Hx of bladder cancer 10/07/2012  . Sleep apnea     05-02-14 cpap , not yet used-  suggested settings 5  . Cancer ([place])   . Prostate cancer ([place]) 2000  . Multiple myeloma   . Non Hodgkin's lymphoma ([place])   . Myocardial infarction [facility])     '96    Past Surgical History  Procedure Laterality Date  . Prostate surgery    . Heart stents x 5  1999  . Portacath placement  07/26/2009    right chest  . Wrist surgery      right  . Left ear skin cancer removed    . Bone marrow transplant  2011  . Pacemaker insertion  07/22/2011    Medtronic  . Coronary angioplasty  06/24/2000    PCI and stenting in mid & proximal RCA  . Insert / replace / remove pacemaker    . Tee without cardioversion  10/13/2012    Procedure: TRANSESOPHAGEAL ECHOCARDIOGRAM (TEE);  Surgeon: Sanda Klein, MD;  Location[facility] ENDOSCOPY;  Service: Cardiovascular;  Laterality: N/A;  pat/kay/echo notified  . Colonoscopy N/A 01/01/2013    Procedure: COLONOSCOPY;  Surgeon: Rogene Houston, MD;  [facility]O [facility];  Service: Endoscopy;  Laterality: N/A;  825-moved to [place] notified pt  . Rotator    . Rotator cuff repair Right   . Colon surgery      colon resection  . Bladder surgery    . Shoulder  arthroscopy with subacromial decompression Right 07/21/2013    Procedure: RIGHT SHOULDER ARTHROSCOPY WITH SUBACROMIAL DECOMPRESSION AND DEBRIDEMENT & Injection of Left Shoulder;  Surgeon: Alta Corning, MD;  Location: [place];  Service: Orthopedics;  Laterality: Right;  . US echocardiography  06/19/2011    RV mildly dilated,mild to mod. MR,mild AI,mild PI  . Nm myocar perf wall motion  11/27/2007    inferior scar  . Inguinal hernia repair Right 05/04/2014    Procedure: OPEN RIGHT INGUINAL HERNIA REPAIR with mesh;  Surgeon: Edward Jolly, MD;  [facility]: [facility] ORS;  Service: General;  Laterality: Right;    There were no vitals filed for this visit.  Visit Diagnosis:  Muscle weakness  Pain of both shoulder joints  Stiffness of shoulder joint, left  Stiffness of shoulder joint, right  Tight fascia       Subjective Assessment - 10/13/15 1203    Subjective  S: The left shoulder is tighter than the right.    Special Tests DASH: 31.82   Currently in Pain? No/denies            [facility] OT Assessment - 10/13/15 1017    Assessment   Diagnosis Hx of CVA-shoulder pain   Precautions   Precautions None            Quick Dash - 10/13/15 1052    Open a tight or new jar Mild difficulty   Do heavy household chores (wash walls, wash floors) Mild difficulty   Carry a shopping bag or briefcase Mild difficulty   Wash your back Moderate difficulty   Use a knife to cut food No difficulty   Recreational activities in which you take some force or impact through your arm, shoulder, or hand (golf, hammering, tennis) Severe difficulty   During the past week, to what extent has your arm, shoulder or hand problem interfered with your normal social activities with family, friends, neighbors, or groups? Not at all   During the past week, to what extent has your arm, shoulder or hand problem limited your work or other regular daily activities Modererately   Arm, shoulder, or hand pain. Moderate   Tingling (pins and needles) in your arm, shoulder, or hand None   Difficulty Sleeping Moderate difficulty   DASH Score 31.82 %              OT Treatments/Exercises (OP) - 10/13/15 1018    Exercises   Exercises Shoulder   Shoulder Exercises: Supine   Protraction PROM;10 reps;AAROM;15 reps;Both   Horizontal ABduction PROM;10 reps;AAROM;15 reps;Both   External Rotation PROM;10 reps;AAROM;15 reps;Both   Internal Rotation PROM;5 reps;AAROM;15 reps;Both   Flexion PROM;5 reps;AAROM;15 reps;Both   ABduction PROM;5 reps;AAROM;15 reps;Both   Shoulder Exercises: Seated   Protraction Both;AROM;10 reps   Flexion Both;AROM;10 reps   Shoulder Exercises: ROM/Strengthening   Proximal Shoulder Strengthening, Supine 10 X each without resting   Other ROM/Strengthening Exercises flext to 90 holding dowel rod with both  hands to 90 hold 10 seconds to 120 hold 10 seconds to 170 hold 10 seconds   Manual Therapy   Manual Therapy Myofascial release   Manual therapy comments Manual therapy completed prior to therapeutic exercises   Myofascial Release Myofascial release to bilateral upper arms, trapezius, and scapularis regions to decrease pain and fascial restrictions, increase joint mobility.  OT Short Term Goals - 10/03/15 1227    OT SHORT TERM GOAL #1   Title Pt will be educated on and independent in HEP.    Time 6   Period Weeks   Status On-going   OT SHORT TERM GOAL #2   Title Pt will decrease fascial restrictions from mod to min amounts in BUE to decrease pain and increase range of motion.   Time 6   Period Weeks   Status On-going   OT SHORT TERM GOAL #3   Title Pt will decrease pain to 3/10 or less in BUE to increase ability to use during daily tasks.    Time 6   Period Weeks   Status On-going   OT SHORT TERM GOAL #4   Title Pt will increase A/ROM to [facility] to increase ability to reach into high cabinets during daily tasks.    Time 6   Period Weeks   Status On-going   OT SHORT TERM GOAL #5   Title Pt will increase strength to 4+/5 to increase ability to complete gardening and household chores using BUE.    Time 6   Period Weeks   Status On-going                  Plan - 10/13/15 1205    Clinical Impression Statement A: Pt continues to demonstrate difficulty with A/ROM exercises greater than 90 degrees in sitting. Unable to complete all seated exercises due to time constraints, pt required multiple rest breaks throughout session due to muscle fatigue.    Plan P: Resume A/ROM in supine & attempt to continue seated, provide A/ROM HEP for supine exercises.         Problem List Patient Active Problem List   Diagnosis Date Noted  . Hypokalemia 11/03/2014  . URI (upper respiratory infection) 11/03/2014  . Weakness  11/03/2014  . Obstructive sleep apnea 08/07/2014  . Central sleep apnea 08/07/2014  . Inguinal hernia 05/04/2014  . Chest pain with moderate risk for cardiac etiology 11/25/2013  . Fever 09/13/2013  . Pneumonia 09/13/2013  . Pancytopenia ([place]) 09/13/2013  . Muscle weakness (generalized) 08/04/2013  . Status post arthroscopy of shoulder 08/04/2013  . Pain in joint, shoulder region 08/04/2013  . Rotator cuff tear arthropathy of right shoulder 07/21/2013  . Left rotator cuff tear arthropathy 07/21/2013  . Shortness of breath 07/07/2013  . 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 Hyperlipidemia with target LDL less than 100 10/08/2012  . Prediabetes 10/08/2012  . Expressive aphasia 10/08/2012  . Hemiplegia affecting right dominant side ([place]) 10/08/2012  . 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 Hx of bladder cancer 10/07/2012  . 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 ASTHMA, UNSPECIFIED 03/22/2010  . Nonspecific (abnormal) findings on radiological and other examination of body structure 03/22/2010  . Hx of lymphoma 03/21/2010  . Multiple myeloma in remission ([place]) 03/21/2010  . Anxiety state 03/21/2010  . Essential hypertension 03/21/2010  . MYOCARDIAL INFARCTION 03/21/2010  . Coronary atherosclerosis 03/21/2010  . NEPHROLITHIASIS 03/21/2010  . ELEVATED PROSTATE SPECIFIC ANTIGEN 03/21/2010  . ROTATOR CUFF REPAIR, RIGHT, HX OF 03/21/2010    Guadelupe Sabin, OTR/L  [phone]  10/13/2015, 12:08 PM  [place] [address] [place], [place], [address] Phone: [phone]   Fax:  [phone]  Name: Jeremy Parrish MRN: [id number] Date of Birth: 08/14/1941

## 2015-10-13 NOTE — Therapy (Signed)
[place], [place], [address] Phone: [phone]   Fax:  [phone]  Physical Therapy Treatment  Patient Details  Name: Jeremy Parrish MRN: [id number] Date of Birth: Jun 06, 1941 Referring Provider: Patrici Ranks   Encounter Date: 10/13/2015      PT End of Session - 10/13/15 1011    Visit Number 11   Number of Visits 13   Date for PT Re-Evaluation 10/31/15   Authorization Type Medicare and BCBS    Authorization Time Period 09/05/15 to 11/05/15   Authorization - Visit Number 11   Authorization - Number of Visits 13   PT Start Time 0936   PT Stop Time 1015   PT Time Calculation (min) 39 min   Equipment Utilized During Treatment Gait belt   Activity Tolerance Patient tolerated treatment well      Past Medical History  Diagnosis Date  . Hypertension   . Heart disease   . Kidney stones     history  . Lung mass   . Heart murmur   . 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 Coronary artery disease   . Depression   . Shortness of breath   . Peripheral arterial disease ([place])   . Bladder neck contracture   . Personal history of other diseases of circulatory system   . Aortic aneurysm of unspecified site without mention of rupture   . Arthritis   . Intestinovesical fistula   . Esophageal reflux   . Hyperlipidemia   . Anemia   . CHF (congestive heart failure) ([place])   . COPD (chronic obstructive pulmonary disease) ([place])   . Cerebral atherosclerosis     Carotid Doppler, 02/16/2013 - Bilateral Proximal ICAs,demonstrate mild plaque w/o evidence of significant diameter reduction, dissection, or any other vascular abnormality  . Complication of anesthesia   . PONV (postoperative nausea and vomiting)   . Stroke [facility]) 2013    Speech.  . Hx of bladder cancer 10/07/2012  . Sleep apnea     05-02-14 cpap , not yet used- suggested settings 5  .  Cancer ([place])   . Prostate cancer ([place]) 2000  . Multiple myeloma   . Non Hodgkin's lymphoma ([place])   . Myocardial infarction [facility])     '96    Past Surgical History  Procedure Laterality Date  . Prostate surgery    . Heart stents x 5  1999  . Portacath placement  07/26/2009    right chest  . Wrist surgery      right  . Left ear skin cancer removed    . Bone marrow transplant  2011  . Pacemaker insertion  07/22/2011    Medtronic  . Coronary angioplasty  06/24/2000    PCI and stenting in mid & proximal RCA  . Insert / replace / remove pacemaker    . Tee without cardioversion  10/13/2012    Procedure: TRANSESOPHAGEAL ECHOCARDIOGRAM (TEE);  Surgeon: Sanda Klein, MD;  Location: [facility] ENDOSCOPY;  Service: Cardiovascular;  Laterality: N/A;  pat/kay/echo notified  . Colonoscopy N/A 01/01/2013    Procedure: COLONOSCOPY;  Surgeon: Rogene Houston, MD;  [facility];  Service: Endoscopy;  Laterality: N/A;  825-moved to [place] notified pt  . Rotator    . Rotator cuff repair Right   . Colon surgery      colon resection  . Bladder surgery    . Shoulder  arthroscopy with subacromial decompression Right 07/21/2013    Procedure: RIGHT SHOULDER ARTHROSCOPY WITH SUBACROMIAL DECOMPRESSION AND DEBRIDEMENT & Injection of Left Shoulder;  Surgeon: Alta Corning, MD;  Location: [place];  Service: Orthopedics;  Laterality: Right;  . US echocardiography  06/19/2011    RV mildly dilated,mild to mod. MR,mild AI,mild PI  . Nm myocar perf wall motion  11/27/2007    inferior scar  . Inguinal hernia repair Right 05/04/2014    Procedure: OPEN RIGHT INGUINAL HERNIA REPAIR with mesh;  Surgeon: Edward Jolly, MD;  [facility];  Service: General;  Laterality: Right;    There were no vitals filed for this visit.  Visit Diagnosis:  Muscle weakness  Muscle weakness (generalized)  Difficulty walking  Unsteadiness  Decreased functional activity tolerance      Subjective Assessment - 10/13/15 0939     Subjective Pt states he is sore.  Not as bad as yesterday but still sore.  No pain   Currently in Pain? No/denies              [facility] Adult PT Treatment/Exercise - 10/13/15 1004    Knee/Hip Exercises: Machines for Strengthening   Cybex Knee Extension 4 PL B x 10 reps    Cybex Knee Flexion 4 Pl B x 10 reps    Knee/Hip Exercises: Standing   Stairs x 2 RT              Balance Exercises - 10/13/15 0941    Balance Exercises: Standing   Balance Beam side stepping over hurdles x 2 RT    Step Over Hurdles / [place] 6 and 12in hurdles 2RT  on balance beam    [facility] Rotation Foam/compliant surface;Right turn;Left turn   Marching Limitations on foam x 10            PT Education - 10/13/15 1010    Education provided Yes   Education Details To slow down when ccompleting exercises.  The importance of keeping core tight during exercises.    Person(s) Educated Patient   Methods Explanation   Comprehension Verbalized understanding;Returned demonstration          PT Short Term Goals - 10/03/15 1134    PT SHORT TERM GOAL #1   Title Patient will improve 6 minute walk to at least 1235f with no rest breaks and no noticeable decrease in speed/increase in fatigue towards latter part of gait distance    Baseline 11/22- 1245fwith no decrease in speed    Time 3   Period Weeks   Status Achieved   PT SHORT TERM GOAL #2   Title Patient to score at least 52 on BERG balance test to demonstrate reduced fall risk during functional activities    Baseline 11/22- scored 53   Time 3   Period Weeks   Status Achieved   PT SHORT TERM GOAL #3   Title Patient will be independent in correctly and consistently performing appropriate HEP, to be updated PRN    Time 3   Period Weeks   Status Achieved           PT Long Term Goals - 10/03/15 1135    PT LONG TERM GOAL #1   Title Patient will demonstrate 5/5 strength in all muslces tested in order to promote enhanced balance and functional  endurance with reduced fall risk    Time 6   Period Weeks   Status On-going   PT LONG TERM GOAL #2   Title  Patient will score at least a 54 on the BERG balance test in order to demonstrate significantly reduced fall risk during all functional situations    Time 6   Period Weeks   Status On-going   PT LONG TERM GOAL #3   Title Patient will be able to ambulate at least 1453f duriung the 6 minute walk test in order to demonstrate improved functional actvity tolerance for improved performance in functional situations    Time 6   Period Weeks   Status On-going   PT LONG TERM GOAL #4   Title Patient to be performing at least 20 minutes of light-moderate intensity exercise at least 3 days per week in [place] to maintain functional gains and promote overall improved healthy lifestyle    Time 6   Period Weeks   Status On-going               Plan - 10/13/15 1011    Clinical Impression Statement Pt session focused on strengtening knees as well as balance.  Pt needed multiiple verbal cuings to keep core strong with exercises.  Pt had increased SOB today; pt stated that he took a mucinex prior to coming to therapy.  Noted congestion with breathing therapist urged pt to contact MD if SOB persists  Pt needed short rest breaks after each balance activity.    PT Next Visit Plan Focus on knee strength and general steadiness on stairs/uneven surfaces; DC to advanced HEP in 2 more sessions         Problem List Patient Active Problem List   Diagnosis Date Noted  . Hypokalemia 11/03/2014  . URI (upper respiratory infection) 11/03/2014  . Weakness 11/03/2014  . Obstructive sleep apnea 08/07/2014  . Central sleep apnea 08/07/2014  . Inguinal hernia 05/04/2014  . Chest pain with moderate risk for cardiac etiology 11/25/2013  . Fever 09/13/2013  . Pneumonia 09/13/2013  . [place]ancytopenia ([place] 09/13/2013  . Muscle weakness (generalized) 08/04/2013  . Status post arthroscopy of shoulder 08/04/2013   . Pain in joint, shoulder region 08/04/2013  . Rotator cuff tear arthropathy of right shoulder 07/21/2013  . Left rotator cuff tear arthropathy 07/21/2013  . Shortness of breath 07/07/2013  . Aneurysm of iliac artery ([place] 01/26/2013  . Hyperlipidemia with target LDL less than 100 10/08/2012  . Prediabetes 10/08/2012  . Expressive aphasia 10/08/2012  . Hemiplegia affecting right dominant side ([place] 10/08/2012  . 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 Hx of bladder cancer 10/07/2012  . 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 Hypogammaglobulinemia ([place] 09/28/2012  . ASTHMA, UNSPECIFIED 03/22/2010  . Nonspecific (abnormal) findings on radiological and other examination of body structure 03/22/2010  . Hx of lymphoma 03/21/2010  . Multiple myeloma in remission ([place] 03/21/2010  . Anxiety state 03/21/2010  . Essential hypertension 03/21/2010  . MYOCARDIAL INFARCTION 03/21/2010  . Coronary atherosclerosis 03/21/2010  . NEPHROLITHIASIS 03/21/2010  . ELEVATED PROSTATE SPECIFIC ANTIGEN 03/21/2010  . ROTATOR CUFF REPAIR, RIGHT, HX OF 03/21/2010  CRayetta Humphrey PT CLT [phone]/12/2014, 10:16 AM  [place] [address]Phone: [phone]  Fax:  [phone] Name: Jeremy GOOSTREEMRN: [id number] of Birth: 114-May-1942

## 2015-10-13 NOTE — Progress Notes (Signed)
   Subjective:    Patient ID: Jeremy Parrish, male    DOB: Dec 18, 1940, 74 y.o.   MRN: [id number]  Cough This is a new problem. The current episode started in the past 7 days. The problem has been unchanged. The cough is non-productive. Associated symptoms include myalgias and rhinorrhea. Pertinent negatives include no chest pain, ear pain, fever or wheezing. Treatments tried: Elissa Lovett, Mucinex. The treatment provided no relief.   Patient has no other concerns at this time.   patient with significant cough congestion sinus pressure not feeling good symptoms over the past several days  Review of Systems  Constitutional: Negative for fever and activity change.  HENT: Positive for congestion and rhinorrhea. Negative for ear pain.   Eyes: Negative for discharge.  Respiratory: Positive for cough. Negative for wheezing.   Cardiovascular: Negative for chest pain.  Musculoskeletal: Positive for myalgias.       Objective:   Physical Exam  Constitutional: He appears well-developed.  HENT:  Head: Normocephalic.  Mouth/Throat: Oropharynx is clear and moist. No oropharyngeal exudate.  Neck: Normal range of motion.  Cardiovascular: Normal rate, regular rhythm and normal heart sounds.   No murmur heard. Pulmonary/Chest: Effort normal and breath sounds normal. He has no wheezes.  Lymphadenopathy:    He has no cervical adenopathy.  Neurological: He exhibits normal muscle tone.  Skin: Skin is warm and dry.  Nursing note and vitals reviewed.         Assessment & Plan:   bronchitis  minimal reactive airway Sinusitis  patient at high risk of pneumonia I don't feel x-rays lab work indicated currently patient not toxic  Rocephin shot Prednisone taper Albuterol when necessary Nevus nebulizer treatment given with improvement  warning signs discussed follow-up if problems antibiotics prescribed

## 2015-10-16 ENCOUNTER — Encounter (HOSPITAL_COMMUNITY): Payer: Medicare Other | Attending: Oncology

## 2015-10-16 DIAGNOSIS — C9000 Multiple myeloma not having achieved remission: Secondary | ICD-10-CM | POA: Diagnosis not present

## 2015-10-16 DIAGNOSIS — C9001 Multiple myeloma in remission: Secondary | ICD-10-CM

## 2015-10-16 DIAGNOSIS — D801 Nonfamilial hypogammaglobulinemia: Secondary | ICD-10-CM

## 2015-10-16 LAB — CBC WITH DIFFERENTIAL/PLATELET
BASOS ABS: 0.1 10*3/uL (ref 0.0–0.1)
BASOS PCT: 2 %
Eosinophils Absolute: 0.1 10*3/uL (ref 0.0–0.7)
Eosinophils Relative: 3 %
HEMATOCRIT: 37.2 % — AB (ref 39.0–52.0)
HEMOGLOBIN: 12 g/dL — AB (ref 13.0–17.0)
Lymphocytes Relative: 29 %
Lymphs Abs: 1 10*3/uL (ref 0.7–4.0)
MCH: 29.1 pg (ref 26.0–34.0)
MCHC: 32.3 g/dL (ref 30.0–36.0)
MCV: 90.3 fL (ref 78.0–100.0)
Monocytes Absolute: 0.4 10*3/uL (ref 0.1–1.0)
Monocytes Relative: 12 %
NEUTROS ABS: 1.9 10*3/uL (ref 1.7–7.7)
NEUTROS PCT: 54 %
Platelets: 120 10*3/uL — ABNORMAL LOW (ref 150–400)
RBC: 4.12 MIL/uL — AB (ref 4.22–5.81)
RDW: 15.9 % — ABNORMAL HIGH (ref 11.5–15.5)
WBC: 3.4 10*3/uL — AB (ref 4.0–10.5)

## 2015-10-16 LAB — COMPREHENSIVE METABOLIC PANEL
ALBUMIN: 3.6 g/dL (ref 3.5–5.0)
ALK PHOS: 70 U/L (ref 38–126)
ALT: 67 U/L — ABNORMAL HIGH (ref 17–63)
AST: 61 U/L — AB (ref 15–41)
Anion gap: 7 (ref 5–15)
BILIRUBIN TOTAL: 0.6 mg/dL (ref 0.3–1.2)
BUN: 14 mg/dL (ref 6–20)
CALCIUM: 8.6 mg/dL — AB (ref 8.9–10.3)
CO2: 26 mmol/L (ref 22–32)
Chloride: 105 mmol/L (ref 101–111)
Creatinine, Ser: 0.9 mg/dL (ref 0.61–1.24)
GFR calc Af Amer: >60 mL/min (ref >60)
GFR calc non Af Amer: >60 mL/min (ref >60)
GLUCOSE: 113 mg/dL — AB (ref 65–99)
Potassium: 3.3 mmol/L — ABNORMAL LOW (ref 3.5–5.1)
SODIUM: 138 mmol/L (ref 135–145)
TOTAL PROTEIN: 6.8 g/dL (ref 6.5–8.1)

## 2015-10-16 NOTE — Progress Notes (Signed)
Labs drawn

## 2015-10-17 ENCOUNTER — Ambulatory Visit (HOSPITAL_COMMUNITY): Payer: Medicare Other | Admitting: Physical Therapy

## 2015-10-17 ENCOUNTER — Encounter (HOSPITAL_COMMUNITY): Payer: Self-pay | Admitting: Occupational Therapy

## 2015-10-17 ENCOUNTER — Ambulatory Visit (HOSPITAL_COMMUNITY): Payer: Medicare Other | Admitting: Occupational Therapy

## 2015-10-17 DIAGNOSIS — M6289 Other specified disorders of muscle: Secondary | ICD-10-CM

## 2015-10-17 DIAGNOSIS — M25611 Stiffness of right shoulder, not elsewhere classified: Secondary | ICD-10-CM

## 2015-10-17 DIAGNOSIS — M6281 Muscle weakness (generalized): Secondary | ICD-10-CM | POA: Diagnosis not present

## 2015-10-17 DIAGNOSIS — M629 Disorder of muscle, unspecified: Secondary | ICD-10-CM

## 2015-10-17 DIAGNOSIS — M25512 Pain in left shoulder: Secondary | ICD-10-CM | POA: Diagnosis not present

## 2015-10-17 DIAGNOSIS — M25612 Stiffness of left shoulder, not elsewhere classified: Secondary | ICD-10-CM | POA: Diagnosis not present

## 2015-10-17 DIAGNOSIS — M25511 Pain in right shoulder: Secondary | ICD-10-CM

## 2015-10-17 DIAGNOSIS — R262 Difficulty in walking, not elsewhere classified: Secondary | ICD-10-CM

## 2015-10-17 DIAGNOSIS — R2681 Unsteadiness on feet: Secondary | ICD-10-CM

## 2015-10-17 NOTE — Therapy (Signed)
[place], [place], [address] Phone: [phone]   Fax:  [phone]  Occupational Therapy Treatment  Patient Details  Name: Jeremy Parrish MRN: [id number] Date of Birth: 1941-04-17 Referring Provider: Dr. Whitney Muse  Encounter Date: 10/17/2015      OT End of Session - 10/17/15 1104    Visit Number 5   Number of Visits 12   Date for OT Re-Evaluation 11/20/15  mini reassessment 10/19/15   Authorization Type Medicare   Authorization Time Period Before 10th visit   Authorization - Visit Number 5   Authorization - Number of Visits 10   OT Start Time 1019   OT Stop Time 1102   OT Time Calculation (min) 43 min   Activity Tolerance Patient tolerated treatment well   Behavior During Therapy [facility] for tasks assessed/performed      Past Medical History  Diagnosis Date  . Hypertension   . Heart disease   . Kidney stones     history  . Lung mass   . Heart murmur   . 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 Coronary artery disease   . Depression   . Shortness of breath   . Peripheral arterial disease ([place])   . Bladder neck contracture   . Personal history of other diseases of circulatory system   . Aortic aneurysm of unspecified site without mention of rupture   . Arthritis   . Intestinovesical fistula   . Esophageal reflux   . Hyperlipidemia   . Anemia   . CHF (congestive heart failure) ([place])   . COPD (chronic obstructive pulmonary disease) ([place])   . Cerebral atherosclerosis     Carotid Doppler, 02/16/2013 - Bilateral Proximal ICAs,demonstrate mild plaque w/o evidence of significant diameter reduction, dissection, or any other vascular abnormality  . Complication of anesthesia   . PONV (postoperative nausea and vomiting)   . Stroke [facility]) 2013    Speech.  . Hx of bladder cancer 10/07/2012  . Sleep apnea     05-02-14 cpap , not yet used-  suggested settings 5  . Cancer ([place])   . Prostate cancer ([place]) 2000  . Multiple myeloma   . Non Hodgkin's lymphoma ([place])   . Myocardial infarction [facility])     '96    Past Surgical History  Procedure Laterality Date  . Prostate surgery    . Heart stents x 5  1999  . Portacath placement  07/26/2009    right chest  . Wrist surgery      right  . Left ear skin cancer removed    . Bone marrow transplant  2011  . Pacemaker insertion  07/22/2011    Medtronic  . Coronary angioplasty  06/24/2000    PCI and stenting in mid & proximal RCA  . Insert / replace / remove pacemaker    . Tee without cardioversion  10/13/2012    Procedure: TRANSESOPHAGEAL ECHOCARDIOGRAM (TEE);  Surgeon: Sanda Klein, MD;  Location: [facility] ENDOSCOPY;  Service: Cardiovascular;  Laterality: N/A;  pat/kay/echo notified  . Colonoscopy N/A 01/01/2013    Procedure: COLONOSCOPY;  Surgeon: Rogene Houston, MD;  [facility];  Service: Endoscopy;  Laterality: N/A;  825-moved to [place] notified pt  . Rotator    . Rotator cuff repair Right   . Colon surgery      colon resection  . Bladder surgery    . Shoulder  arthroscopy with subacromial decompression Right 07/21/2013    Procedure: RIGHT SHOULDER ARTHROSCOPY WITH SUBACROMIAL DECOMPRESSION AND DEBRIDEMENT & Injection of Left Shoulder;  Surgeon: Alta Corning, MD;  Location: [place];  Service: Orthopedics;  Laterality: Right;  . US echocardiography  06/19/2011    RV mildly dilated,mild to mod. MR,mild AI,mild PI  . Nm myocar perf wall motion  11/27/2007    inferior scar  . Inguinal hernia repair Right 05/04/2014    Procedure: OPEN RIGHT INGUINAL HERNIA REPAIR with mesh;  Surgeon: Edward Jolly, MD;  Location[facility] ORS;  Service: General;  Laterality: Right;    There were no vitals filed for this visit.  Visit Diagnosis:  Pain of both shoulder joints  Stiffness of shoulder joint, left  Stiffness of shoulder joint, right  Tight fascia      Subjective Assessment  - 10/17/15 1020    Subjective  S: My arms get tired when I do those exercises. (proximal shoulder strengthening)   Currently in Pain? Yes   Pain Score 2    Pain Location Shoulder   Pain Orientation Right;Left   Pain Descriptors / Indicators Aching   Pain Type Chronic pain                      OT Treatments/Exercises (OP) - 10/17/15 1022    Exercises   Exercises Shoulder   Shoulder Exercises: Supine   Protraction PROM;5 reps;AROM;15 reps   Horizontal ABduction PROM;5 reps;AROM;15 reps   External Rotation PROM;5 reps;AROM;15 reps   Internal Rotation PROM;5 reps;AROM;15 reps   Flexion PROM;5 reps;AROM;15 reps   ABduction PROM;5 reps;AROM;15 reps   Shoulder Exercises: Seated   Protraction Both;AROM;10 reps   Horizontal ABduction Limitations hold today due to fatigue   External Rotation Both;AROM;10 reps   Internal Rotation Both;AROM;10 reps   Flexion Both;AROM;10 reps   ABduction Limitations hold today due to fatigue   Shoulder Exercises: ROM/Strengthening   Wall Wash 1' each arm   Proximal Shoulder Strengthening, Supine 10 X each without resting   Manual Therapy   Manual Therapy Myofascial release   Manual therapy comments Manual therapy completed prior to therapeutic exercises   Myofascial Release Myofascial release to bilateral upper arms, trapezius, and scapularis regions to decrease pain and fascial restrictions, increase joint mobility.                 OT Education - 10/17/15 1103    Education provided Yes   Education Details A/ROM exercises supine & standing   Person(s) Educated Patient   Methods Explanation;Demonstration;Handout   Comprehension Verbalized understanding;Returned demonstration          OT Short Term Goals - 10/03/15 1227    OT SHORT TERM GOAL #1   Title Pt will be educated on and independent in [place].    Time 6   Period Weeks   Status On-going   OT SHORT TERM GOAL #2   Title Pt will decrease fascial restrictions from mod  to min amounts in BUE to decrease pain and increase range of motion.   Time 6   Period Weeks   Status On-going   OT SHORT TERM GOAL #3   Title Pt will decrease pain to 3/10 or less in BUE to increase ability to use during daily tasks.    Time 6   Period Weeks   Status On-going   OT SHORT TERM GOAL #4   Title Pt will increase A/ROM to [facility] to increase ability to  reach into high cabinets during daily tasks.    Time 6   Period Weeks   Status On-going   OT SHORT TERM GOAL #5   Title Pt will increase strength to 4+/5 to increase ability to complete gardening and household chores using BUE.    Time 6   Period Weeks   Status On-going                  Plan - 10/17/15 1104    Clinical Impression Statement A: Pt completed A/ROM exercises in supine & seated, with the exception of horizontal abduction & abduction in sitting due to muscle fatigue. Pt required cuing for form during exercises. Added wall wash this session, pt performed with minimal fatigue. Provided pt with A/ROM HEP, instructed to complete in supine and attempt seated.    Plan P: Mini-reassessment (pt has only been seen 5x). Continue to work on completing A/ROM in sitting as able to tolerate, focusing on correct form.         Problem List Patient Active Problem List   Diagnosis Date Noted  . Hypokalemia 11/03/2014  . URI (upper respiratory infection) 11/03/2014  . Weakness 11/03/2014  . Obstructive sleep apnea 08/07/2014  . Central sleep apnea 08/07/2014  . Inguinal hernia 05/04/2014  . Chest pain with moderate risk for cardiac etiology 11/25/2013  . Fever 09/13/2013  . Pneumonia 09/13/2013  . Pancytopenia ([place]) 09/13/2013  . Muscle weakness (generalized) 08/04/2013  . Status post arthroscopy of shoulder 08/04/2013  . Pain in joint, shoulder region 08/04/2013  . Rotator cuff tear arthropathy of right shoulder 07/21/2013  . Left rotator cuff tear arthropathy 07/21/2013  . Shortness of breath 07/07/2013  .  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 Hyperlipidemia with target LDL less than 100 10/08/2012  . Prediabetes 10/08/2012  . Expressive aphasia 10/08/2012  . Hemiplegia affecting right dominant side ([place]) 10/08/2012  . 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 Hx of bladder cancer 10/07/2012  . 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 ASTHMA, UNSPECIFIED 03/22/2010  . Nonspecific (abnormal) findings on radiological and other examination of body structure 03/22/2010  . Hx of lymphoma 03/21/2010  . Multiple myeloma in remission ([place]) 03/21/2010  . Anxiety state 03/21/2010  . Essential hypertension 03/21/2010  . MYOCARDIAL INFARCTION 03/21/2010  . Coronary atherosclerosis 03/21/2010  . NEPHROLITHIASIS 03/21/2010  . ELEVATED PROSTATE SPECIFIC ANTIGEN 03/21/2010  . ROTATOR CUFF REPAIR, RIGHT, HX OF 03/21/2010    Guadelupe Sabin, OTR/L  [phone]  10/17/2015, 11:08 AM  [place], [place], [address] Phone: [phone]   Fax:  [phone]  Name: Jeremy Parrish MRN: [id number] Date of Birth: [birth year]

## 2015-10-17 NOTE — Patient Instructions (Signed)
1) Shoulder Protraction    Begin with elbows by your side, slowly "punch" straight out in front of you keeping arms/elbows straight. Repeat __10-15_times, __2__set/day.     2) Shoulder Flexion  Supine:     Standing:         Begin with arms at your side with thumbs pointed up, slowly raise both arms up and forward towards overhead. Repeat_10-15 times, __2_set/day.               3) Horizontal abduction/adduction  Supine:   Standing:           Begin with arms straight out in front of you, bring out to the side in at "T" shape. Keep arms straight entire time. Repeat _10-15 times, 2 sets/day                 4) Internal & External Rotation    *No band* -Stand with elbows at the side and elbows bent 90 degrees. Move your forearms away from your body, then bring back inward toward the body.  Repeat _10-15 times_2_sets/day    5) Shoulder Abduction  Supine:     Standing:       Lying on your back begin with your arms flat on the table next to your side. Slowly move your arms out to the side so that they go overhead, in a jumping jack or snow angel movement. Repeat _10-15__times, _2__sets/day

## 2015-10-17 NOTE — Therapy (Signed)
[place], [place], [address] Phone: [phone]   Fax:  [phone]  Physical Therapy Treatment  Patient Details  Name: Jeremy Parrish MRN: [id number] Date of Birth: 1940/11/22 Referring Provider: Patrici Ranks   Encounter Date: 10/17/2015      PT End of Session - 10/17/15 1505    Visit Number 12   Number of Visits 13   Date for PT Re-Evaluation 10/31/15   Authorization Type Medicare and BCBS    Authorization Time Period 09/05/15 to 11/05/15   Authorization - Visit Number 12   Authorization - Number of Visits 13   PT Start Time 1100   PT Stop Time 1145   PT Time Calculation (min) 45 min   Equipment Utilized During Treatment Gait belt   Activity Tolerance Patient tolerated treatment well   Behavior During Therapy [facility] for tasks assessed/performed      Past Medical History  Diagnosis Date  . Hypertension   . Heart disease   . Kidney stones     history  . Lung mass   . Heart murmur   . 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 Coronary artery disease   . Depression   . Shortness of breath   . Peripheral arterial disease ([place])   . Bladder neck contracture   . Personal history of other diseases of circulatory system   . Aortic aneurysm of unspecified site without mention of rupture   . Arthritis   . Intestinovesical fistula   . Esophageal reflux   . Hyperlipidemia   . Anemia   . CHF (congestive heart failure) ([place])   . COPD (chronic obstructive pulmonary disease) ([place])   . Cerebral atherosclerosis     Carotid Doppler, 02/16/2013 - Bilateral Proximal ICAs,demonstrate mild plaque w/o evidence of significant diameter reduction, dissection, or any other vascular abnormality  . Complication of anesthesia   . PONV (postoperative nausea and vomiting)   . Stroke [facility]) 2013    Speech.  . Hx of bladder cancer 10/07/2012  . Sleep apnea      05-02-14 cpap , not yet used- suggested settings 5  . Cancer ([place])   . Prostate cancer ([place]) 2000  . Multiple myeloma   . Non Hodgkin's lymphoma ([place])   . Myocardial infarction [facility]     '96    Past Surgical History  Procedure Laterality Date  . Prostate surgery    . Heart stents x 5  1999  . Portacath placement  07/26/2009    right chest  . Wrist surgery      right  . Left ear skin cancer removed    . Bone marrow transplant  2011  . Pacemaker insertion  07/22/2011    Medtronic  . Coronary angioplasty  06/24/2000    PCI and stenting in mid & proximal RCA  . Insert / replace / remove pacemaker    . Tee without cardioversion  10/13/2012    Procedure: TRANSESOPHAGEAL ECHOCARDIOGRAM (TEE);  Surgeon: Sanda Klein, MD;  [facility] ENDOSCOPY;  Service: Cardiovascular;  Laterality: N/A;  pat/kay/echo notified  . Colonoscopy N/A 01/01/2013    Procedure: COLONOSCOPY;  Surgeon: Rogene Houston, MD;  [facility];  Service: Endoscopy;  Laterality: N/A;  825-moved to [place] notified pt  . Rotator    . Rotator cuff repair Right   . Colon surgery      colon resection  .  Bladder surgery    . Shoulder arthroscopy with subacromial decompression Right 07/21/2013    Procedure: RIGHT SHOULDER ARTHROSCOPY WITH SUBACROMIAL DECOMPRESSION AND DEBRIDEMENT & Injection of Left Shoulder;  Surgeon: Alta Corning, MD;  Location: [place];  Service: Orthopedics;  Laterality: Right;  . US echocardiography  06/19/2011    RV mildly dilated,mild to mod. MR,mild AI,mild PI  . Nm myocar perf wall motion  11/27/2007    inferior scar  . Inguinal hernia repair Right 05/04/2014    Procedure: OPEN RIGHT INGUINAL HERNIA REPAIR with mesh;  Surgeon: Edward Jolly, MD;  [facility];  Service: General;  Laterality: Right;    There were no vitals filed for this visit.  Visit Diagnosis:  Muscle weakness  Muscle weakness (generalized)  Difficulty walking  Unsteadiness      Subjective Assessment  - 10/17/15 1103    Subjective Pt reports that he feels ok today, but he is having some trouble breathing due to chest congestion.    Currently in Pain? Yes   Pain Score 4    Pain Location Knee   Pain Orientation Right;Left                  [facility] Adult PT Treatment/Exercise - 10/17/15 1108    Knee/Hip Exercises: Standing   Heel Raises 20 reps   Rocker Board 2 minutes   Rocker Board Limitations R/L and A/P intermittent HHA   Other Standing Knee Exercises sidestepping with red tband x 3RT             Balance Exercises - 10/17/15 1131    Balance Exercises: Standing   Balance Beam side stepping over hurdles x 2 RT    Tandem Gait 2 reps   Retro Gait 2 reps   Step Over Hurdles / [place] 6 and 12in hurdles 2RT  on balance beam    [facility] Rotation Foam/compliant surface;Right turn;Left turn   Marching Limitations on foam x 10              PT Short Term Goals - 10/03/15 1134    PT SHORT TERM GOAL #1   Title Patient will improve 6 minute walk to at least 1270ft with no rest breaks and no noticeable decrease in speed/increase in fatigue towards latter part of gait distance    Baseline 11/22- 1246ft with no decrease in speed    Time 3   Period Weeks   Status Achieved   PT SHORT TERM GOAL #2   Title Patient to score at least 52 on BERG balance test to demonstrate reduced fall risk during functional activities    Baseline 11/22- scored 53   Time 3   Period Weeks   Status Achieved   PT SHORT TERM GOAL #3   Title Patient will be independent in correctly and consistently performing appropriate HEP, to be updated PRN    Time 3   Period Weeks   Status Achieved           PT Long Term Goals - 10/03/15 1135    PT LONG TERM GOAL #1   Title Patient will demonstrate 5/5 strength in all muslces tested in order to promote enhanced balance and functional endurance with reduced fall risk    Time 6   Period Weeks   Status On-going   PT LONG TERM GOAL #2   Title Patient  will score at least a 54 on the BERG balance test in order to demonstrate significantly reduced fall risk during  all functional situations    Time 6   Period Weeks   Status On-going   PT LONG TERM GOAL #3   Title Patient will be able to ambulate at least 1456ft duriung the 6 minute walk test in order to demonstrate improved functional actvity tolerance for improved performance in functional situations    Time 6   Period Weeks   Status On-going   PT LONG TERM GOAL #4   Title Patient to be performing at least 20 minutes of light-moderate intensity exercise at least 3 days per week in [place] to maintain functional gains and promote overall improved healthy lifestyle    Time 6   Period Weeks   Status On-going               Plan - 10/17/15 1506    Clinical Impression Statement Pt presented to PT with reports of chest congestion that was causing him some SOB and that he may need more rest breaks today. Treatment session focused on functional strengthening and balance training. Pt had increased difficulty completing stepping over hurdles on balance beam today, requiring min A from PT 3 times to prevent LOB. Pt required frequent seated rest breaks today due to reports of increased fatigue, but was able to complete all therex without increased pain in BLE.    PT Next Visit Plan D/C next session per PT POC        Problem List Patient Active Problem List   Diagnosis Date Noted  . Hypokalemia 11/03/2014  . URI (upper respiratory infection) 11/03/2014  . Weakness 11/03/2014  . Obstructive sleep apnea 08/07/2014  . Central sleep apnea 08/07/2014  . Inguinal hernia 05/04/2014  . Chest pain with moderate risk for cardiac etiology 11/25/2013  . Fever 09/13/2013  . Pneumonia 09/13/2013  . Pancytopenia ([place]) 09/13/2013  . Muscle weakness (generalized) 08/04/2013  . Status post arthroscopy of shoulder 08/04/2013  . Pain in joint, shoulder region 08/04/2013  . Rotator cuff tear arthropathy  of right shoulder 07/21/2013  . Left rotator cuff tear arthropathy 07/21/2013  . Shortness of breath 07/07/2013  . 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 Hyperlipidemia with target LDL less than 100 10/08/2012  . Prediabetes 10/08/2012  . Expressive aphasia 10/08/2012  . Hemiplegia affecting right dominant side ([place]) 10/08/2012  . 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 Hx of bladder cancer 10/07/2012  . 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 ASTHMA, UNSPECIFIED 03/22/2010  . Nonspecific (abnormal) findings on radiological and other examination of body structure 03/22/2010  . Hx of lymphoma 03/21/2010  . Multiple myeloma in remission ([place]) 03/21/2010  . Anxiety state 03/21/2010  . Essential hypertension 03/21/2010  . MYOCARDIAL INFARCTION 03/21/2010  . Coronary atherosclerosis 03/21/2010  . NEPHROLITHIASIS 03/21/2010  . ELEVATED PROSTATE SPECIFIC ANTIGEN 03/21/2010  . ROTATOR CUFF REPAIR, RIGHT, HX OF 03/21/2010    Hilma Favors, PT, DPT [phone] 10/17/2015, 3:14 PM  [place], [place], [address] Phone: [phone]   Fax:  [phone]  Name: SHARIFF LASKY MRN: [id number] Date of Birth: 12/17/1940

## 2015-10-18 ENCOUNTER — Ambulatory Visit (HOSPITAL_COMMUNITY): Payer: Medicare Other

## 2015-10-18 DIAGNOSIS — D485 Neoplasm of uncertain behavior of skin: Secondary | ICD-10-CM | POA: Diagnosis not present

## 2015-10-18 DIAGNOSIS — B001 Herpesviral vesicular dermatitis: Secondary | ICD-10-CM | POA: Diagnosis not present

## 2015-10-18 DIAGNOSIS — L821 Other seborrheic keratosis: Secondary | ICD-10-CM | POA: Diagnosis not present

## 2015-10-18 DIAGNOSIS — D1801 Hemangioma of skin and subcutaneous tissue: Secondary | ICD-10-CM | POA: Diagnosis not present

## 2015-10-18 DIAGNOSIS — L570 Actinic keratosis: Secondary | ICD-10-CM | POA: Diagnosis not present

## 2015-10-18 DIAGNOSIS — Z85828 Personal history of other malignant neoplasm of skin: Secondary | ICD-10-CM | POA: Diagnosis not present

## 2015-10-18 DIAGNOSIS — L98494 Non-pressure chronic ulcer of skin of other sites with necrosis of bone: Secondary | ICD-10-CM | POA: Diagnosis not present

## 2015-10-19 ENCOUNTER — Ambulatory Visit (HOSPITAL_COMMUNITY): Payer: Medicare Other | Admitting: Physical Therapy

## 2015-10-19 ENCOUNTER — Ambulatory Visit (HOSPITAL_COMMUNITY): Payer: Medicare Other | Admitting: Occupational Therapy

## 2015-10-19 ENCOUNTER — Encounter (HOSPITAL_COMMUNITY): Payer: Self-pay | Admitting: Occupational Therapy

## 2015-10-19 DIAGNOSIS — M6281 Muscle weakness (generalized): Secondary | ICD-10-CM

## 2015-10-19 DIAGNOSIS — R2681 Unsteadiness on feet: Secondary | ICD-10-CM

## 2015-10-19 DIAGNOSIS — M25612 Stiffness of left shoulder, not elsewhere classified: Secondary | ICD-10-CM

## 2015-10-19 DIAGNOSIS — M629 Disorder of muscle, unspecified: Secondary | ICD-10-CM

## 2015-10-19 DIAGNOSIS — M25511 Pain in right shoulder: Secondary | ICD-10-CM

## 2015-10-19 DIAGNOSIS — M25512 Pain in left shoulder: Secondary | ICD-10-CM

## 2015-10-19 DIAGNOSIS — R262 Difficulty in walking, not elsewhere classified: Secondary | ICD-10-CM

## 2015-10-19 DIAGNOSIS — M25611 Stiffness of right shoulder, not elsewhere classified: Secondary | ICD-10-CM | POA: Diagnosis not present

## 2015-10-19 DIAGNOSIS — M6289 Other specified disorders of muscle: Secondary | ICD-10-CM

## 2015-10-19 NOTE — Therapy (Signed)
[facility] [address] [place], [place], [address] Phone: [phone]   Fax:  [phone]  Occupational Therapy Reassessment and Treatment  Patient Details  Name: Jeremy Parrish MRN: [id number] Date of Birth: 03-Apr-1941 Referring Provider: Dr. Whitney Muse  Encounter Date: 10/19/2015      OT End of Session - 10/19/15 1218    Visit Number 6   Number of Visits 12   Date for OT Re-Evaluation 11/20/15   Authorization Type Medicare   Authorization Time Period Before 10th visit   Authorization - Visit Number 6   Authorization - Number of Visits 10   OT Start Time 1017   OT Stop Time 1100   OT Time Calculation (min) 43 min   Activity Tolerance Patient tolerated treatment well   Behavior During Therapy [facility] for tasks assessed/performed      Past Medical History  Diagnosis Date  . Hypertension   . Heart disease   . Kidney stones     history  . Lung mass   . Heart murmur   . 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 Coronary artery disease   . Depression   . Shortness of breath   . Peripheral arterial disease ([place])   . Bladder neck contracture   . Personal history of other diseases of circulatory system   . Aortic aneurysm of unspecified site without mention of rupture   . Arthritis   . Intestinovesical fistula   . Esophageal reflux   . Hyperlipidemia   . Anemia   . CHF (congestive heart failure) ([place])   . COPD (chronic obstructive pulmonary disease) ([place])   . Cerebral atherosclerosis     Carotid Doppler, 02/16/2013 - Bilateral Proximal ICAs,demonstrate mild plaque w/o evidence of significant diameter reduction, dissection, or any other vascular abnormality  . Complication of anesthesia   . PONV (postoperative nausea and vomiting)   . Stroke [facility]) 2013    Speech.  . Hx of bladder cancer 10/07/2012  . Sleep apnea     05-02-14 cpap , not yet used- suggested  settings 5  . Cancer ([place])   . Prostate cancer ([place]) 2000  . Multiple myeloma   . Non Hodgkin's lymphoma ([place])   . Myocardial infarction [facility])     '96    Past Surgical History  Procedure Laterality Date  . Prostate surgery    . Heart stents x 5  1999  . Portacath placement  07/26/2009    right chest  . Wrist surgery      right  . Left ear skin cancer removed    . Bone marrow transplant  2011  . Pacemaker insertion  07/22/2011    Medtronic  . Coronary angioplasty  06/24/2000    PCI and stenting in mid & proximal RCA  . Insert / replace / remove pacemaker    . Tee without cardioversion  10/13/2012    Procedure: TRANSESOPHAGEAL ECHOCARDIOGRAM (TEE);  Surgeon: Sanda Klein, MD;  Location: [facility] ENDOSCOPY;  Service: Cardiovascular;  Laterality: N/A;  pat/kay/echo notified  . Colonoscopy N/A 01/01/2013    Procedure: COLONOSCOPY;  Surgeon: Rogene Houston, MD;  [facility];  Service: Endoscopy;  Laterality: N/A;  825-moved to [place] notified pt  . Rotator    . Rotator cuff repair Right   . Colon surgery      colon resection  . Bladder surgery    . Shoulder arthroscopy with  subacromial decompression Right 07/21/2013    Procedure: RIGHT SHOULDER ARTHROSCOPY WITH SUBACROMIAL DECOMPRESSION AND DEBRIDEMENT & Injection of Left Shoulder;  Surgeon: Alta Corning, MD;  Location: [facility];  Service: Orthopedics;  Laterality: Right;  . US echocardiography  06/19/2011    RV mildly dilated,mild to mod. MR,mild AI,mild PI  . Nm myocar perf wall motion  11/27/2007    inferior scar  . Inguinal hernia repair Right 05/04/2014    Procedure: OPEN RIGHT INGUINAL HERNIA REPAIR with mesh;  Surgeon: Edward Jolly, MD;  [facility] ORS;  Service: General;  Laterality: Right;    There were no vitals filed for this visit.  Visit Diagnosis:  Muscle weakness  Pain of both shoulder joints  Stiffness of shoulder joint, left  Stiffness of shoulder joint, right  Tight fascia      Subjective  Assessment - 10/19/15 1021    Subjective  S: My left arm is more sore than my right.    Currently in Pain? Yes   Pain Score 5    Pain Location Shoulder   Pain Orientation Right;Left   Pain Descriptors / Indicators Aching   Pain Type Chronic pain           [facility] OT Assessment - 10/19/15 1021    Assessment   Diagnosis Hx of CVA-shoulder pain   Precautions   Precautions None   AROM   Overall AROM Comments Assessed seated, ER/IR adducted    AROM Assessment Site Shoulder   Right/Left Shoulder Left;Right   Right Shoulder Flexion 150 Degrees  previous 145   Right Shoulder ABduction 160 Degrees  previous 135   Right Shoulder Internal Rotation 75 Degrees  previous 60   Right Shoulder External Rotation 70 Degrees  previous 53   Left Shoulder Flexion 130 Degrees  previous 120   Left Shoulder ABduction 120 Degrees  previous 113   Left Shoulder Internal Rotation 75 Degrees  previous 69   Left Shoulder External Rotation 60 Degrees  previous 50   PROM   Overall PROM Comments Assessed supine, ER/IR adducted    PROM Assessment Site Shoulder   Right/Left Shoulder Left;Right   Right Shoulder Flexion 156 Degrees  previous 149   Right Shoulder ABduction 165 Degrees  previous 151   Right Shoulder Internal Rotation 90 Degrees  same as previous   Right Shoulder External Rotation 85 Degrees  previous 78   Left Shoulder Flexion 155 Degrees  previous 125   Left Shoulder ABduction 151 Degrees  previous 116   Left Shoulder Internal Rotation 90 Degrees  same as previous   Left Shoulder External Rotation 69 Degrees  previous 65   Strength   Overall Strength Comments Assessed seated, ER/IR adducted    Strength Assessment Site Shoulder   Right/Left Shoulder Left;Right   Right Shoulder Flexion 4-/5  previous 3+/5   Right Shoulder ABduction 3+/5  same as previous   Right Shoulder Internal Rotation 4/5  same as previous   Right Shoulder External Rotation 4-/5  same as previous    Left Shoulder Flexion 4-/5  previous 3+/5   Left Shoulder ABduction 3+/5  same as previous   Left Shoulder Internal Rotation 4/5  same as previous   Left Shoulder External Rotation 4-/5  same as previous                  OT Treatments/Exercises (OP) - 10/19/15 1021    Exercises   Exercises Shoulder   Shoulder Exercises: Supine   Protraction  PROM;5 reps   Horizontal ABduction PROM;5 reps   External Rotation PROM;5 reps   Internal Rotation PROM;5 reps   Flexion PROM;5 reps   ABduction PROM;5 reps   Shoulder Exercises: Seated   Protraction Both;AROM;10 reps   Horizontal ABduction Both;AROM;10 reps   Horizontal ABduction Limitations 1 rest break   External Rotation Both;AROM;10 reps   Internal Rotation Both;AROM;10 reps   Flexion Both;AROM;10 reps   Abduction Both;AROM;10 reps   Shoulder Exercises: Pulleys   Flexion 1 minute   ABduction 1 minute   Shoulder Exercises: ROM/Strengthening   Wall Wash 1' each arm   Manual Therapy   Manual Therapy Myofascial release   Manual therapy comments Manual therapy completed prior to therapeutic exercises   Myofascial Release Myofascial release to bilateral upper arms, trapezius, and scapularis regions to decrease pain and fascial restrictions, increase joint mobility.                  OT Short Term Goals - 10/19/15 1218    OT SHORT TERM GOAL #1   Title Pt will be educated on and independent in HEP.    Time 6   Period Weeks   Status On-going   OT SHORT TERM GOAL #2   Title Pt will decrease fascial restrictions from mod to min amounts in BUE to decrease pain and increase range of motion.   Time 6   Period Weeks   Status On-going   OT SHORT TERM GOAL #3   Title Pt will decrease pain to 3/10 or less in BUE to increase ability to use during daily tasks.    Time 6   Period Weeks   Status On-going   OT SHORT TERM GOAL #4   Title Pt will increase A/ROM to [facility] to increase ability to reach into high cabinets during  daily tasks.    Time 6   Period Weeks   Status On-going   OT SHORT TERM GOAL #5   Title Pt will increase strength to 4+/5 to increase ability to complete gardening and household chores using BUE.    Time 6   Period Weeks   Status On-going                  Plan - 10/19/15 1219    Clinical Impression Statement A: Mini-reassessment completed this session, pt has not met any goals however is progressing towards goals. Pt has made improvements in range of motion and strength since beginning OT treatments. Pt has only been seen for 4 treatment sessions before today. Pt able to complete all seated A/ROM exercises with rest breaks provided duet to fatigue.    Plan P: Continue working on correct form and technique during A/ROM exercises, add scapular theraband to work on increased scapular stability during daily tasks.         Problem List Patient Active Problem List   Diagnosis Date Noted  . Hypokalemia 11/03/2014  . URI (upper respiratory infection) 11/03/2014  . Weakness 11/03/2014  . Obstructive sleep apnea 08/07/2014  . Central sleep apnea 08/07/2014  . Inguinal hernia 05/04/2014  . Chest pain with moderate risk for cardiac etiology 11/25/2013  . Fever 09/13/2013  . Pneumonia 09/13/2013  . Pancytopenia ([place]) 09/13/2013  . Muscle weakness (generalized) 08/04/2013  . Status post arthroscopy of shoulder 08/04/2013  . Pain in joint, shoulder region 08/04/2013  . Rotator cuff tear arthropathy of right shoulder 07/21/2013  . Left rotator cuff tear arthropathy 07/21/2013  . Shortness of breath 07/07/2013  .  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 Hyperlipidemia with target LDL less than 100 10/08/2012  . Prediabetes 10/08/2012  . Expressive aphasia 10/08/2012  . Hemiplegia affecting right dominant side ([place]) 10/08/2012  . 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 Hx of bladder cancer  10/07/2012  . 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 ASTHMA, UNSPECIFIED 03/22/2010  . Nonspecific (abnormal) findings on radiological and other examination of body structure 03/22/2010  . Hx of lymphoma 03/21/2010  . Multiple myeloma in remission ([place]) 03/21/2010  . Anxiety state 03/21/2010  . Essential hypertension 03/21/2010  . MYOCARDIAL INFARCTION 03/21/2010  . Coronary atherosclerosis 03/21/2010  . NEPHROLITHIASIS 03/21/2010  . ELEVATED PROSTATE SPECIFIC ANTIGEN 03/21/2010  . ROTATOR CUFF REPAIR, RIGHT, HX OF 03/21/2010    Guadelupe Sabin, OTR/L  [phone]  10/19/2015, 12:21 PM  [facility] [place], [place], [address] Phone: [phone]   Fax:  [phone]  Name: Jeremy Parrish MRN: [id number] Date of Birth: [birth year]

## 2015-10-19 NOTE — Patient Instructions (Signed)
Side Step Abduction With Exercise Band    Feet turned out slightly, squat. Side step ___ feet to each side. Do ___ times per day.  http://ss.exer.us/158   Copyright  VHI. All rights reserved.

## 2015-10-19 NOTE — Therapy (Signed)
[facility] [address] [place], [place], [address] Phone: [phone]   Fax:  [phone]  Physical Therapy Treatment  Patient Details  Name: Jeremy Parrish MRN: [id number] Date of Birth: Apr 10, 1941 Referring Provider: Patrici Ranks   Encounter Date: 10/19/2015      PT End of Session - 10/19/15 1205    Visit Number 13   Number of Visits 13   Authorization Type Medicare and BCBS    Authorization Time Period 09/05/15 to 11/05/15   PT Start Time 1100   PT Stop Time 1142   PT Time Calculation (min) 42 min   Activity Tolerance Patient tolerated treatment well   Behavior During Therapy [facility] for tasks assessed/performed      Past Medical History  Diagnosis Date  . Hypertension   . Heart disease   . Kidney stones     history  . Lung mass   . Heart murmur   . 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 Coronary artery disease   . Depression   . Shortness of breath   . Peripheral arterial disease ([place])   . Bladder neck contracture   . Personal history of other diseases of circulatory system   . Aortic aneurysm of unspecified site without mention of rupture   . Arthritis   . Intestinovesical fistula   . Esophageal reflux   . Hyperlipidemia   . Anemia   . CHF (congestive heart failure) ([place])   . COPD (chronic obstructive pulmonary disease) ([place])   . Cerebral atherosclerosis     Carotid Doppler, 02/16/2013 - Bilateral Proximal ICAs,demonstrate mild plaque w/o evidence of significant diameter reduction, dissection, or any other vascular abnormality  . Complication of anesthesia   . PONV (postoperative nausea and vomiting)   . Stroke [facility]) 2013    Speech.  . Hx of bladder cancer 10/07/2012  . Sleep apnea     05-02-14 cpap , not yet used- suggested settings 5  . Cancer ([place])   . Prostate cancer ([place]) 2000  . Multiple myeloma   . Non Hodgkin's lymphoma ([place])    . Myocardial infarction [facility])     '96    Past Surgical History  Procedure Laterality Date  . Prostate surgery    . Heart stents x 5  1999  . Portacath placement  07/26/2009    right chest  . Wrist surgery      right  . Left ear skin cancer removed    . Bone marrow transplant  2011  . Pacemaker insertion  07/22/2011    Medtronic  . Coronary angioplasty  06/24/2000    PCI and stenting in mid & proximal RCA  . Insert / replace / remove pacemaker    . Tee without cardioversion  10/13/2012    Procedure: TRANSESOPHAGEAL ECHOCARDIOGRAM (TEE);  Surgeon: Sanda Klein, MD;  Location: [facility] ENDOSCOPY;  Service: Cardiovascular;  Laterality: N/A;  pat/kay/echo notified  . Colonoscopy N/A 01/01/2013    Procedure: COLONOSCOPY;  Surgeon: Rogene Houston, MD;  [facility];  Service: Endoscopy;  Laterality: N/A;  825-moved to [place] notified pt  . Rotator    . Rotator cuff repair Right   . Colon surgery      colon resection  . Bladder surgery    . Shoulder arthroscopy with subacromial decompression Right 07/21/2013    Procedure: RIGHT SHOULDER ARTHROSCOPY WITH SUBACROMIAL DECOMPRESSION AND DEBRIDEMENT & Injection of  Left Shoulder;  Surgeon: Alta Corning, MD;  Location: [facility];  Service: Orthopedics;  Laterality: Right;  . US echocardiography  06/19/2011    RV mildly dilated,mild to mod. MR,mild AI,mild PI  . Nm myocar perf wall motion  11/27/2007    inferior scar  . Inguinal hernia repair Right 05/04/2014    Procedure: OPEN RIGHT INGUINAL HERNIA REPAIR with mesh;  Surgeon: Edward Jolly, MD;  [facility];  Service: General;  Laterality: Right;    There were no vitals filed for this visit.  Visit Diagnosis:  Muscle weakness  Difficulty walking  Unsteadiness      Subjective Assessment - 10/19/15 1103    Subjective Pt reports that he has noticed that many things have gotten easier for his since beginning PT. He thinks that his balance is improving, but today he feels a  little off because he is still sick. He reports that he isn't having difficulty with anything in particular.    Currently in Pain? No/denies   Pain Score 0-No pain            [facility] PT Assessment - 10/19/15 1111    Observation/Other Assessments   Focus on Therapeutic Outcomes (FOTO)  31% limited   Functional Tests   Functional tests Single leg stance   Single Leg Stance   Comments LLE 17"   Strength   Right Hip Flexion 5/5   Right Hip Extension 4/5   Right Hip ABduction 5/5   Left Hip Flexion 5/5   Left Hip Extension 4/5   Left Hip ABduction 4+/5   Right Knee Flexion 5/5   Right Knee Extension 5/5   Left Knee Flexion 5/5   Left Knee Extension 5/5   Right Ankle Dorsiflexion 5/5   Left Ankle Dorsiflexion 5/5   6 minute walk test results    Aerobic Endurance Distance Walked 1195   Berg Balance Test   Sit to Stand Able to stand without using hands and stabilize independently   Standing Unsupported Able to stand safely 2 minutes   Sitting with Back Unsupported but Feet Supported on Floor or Stool Able to sit safely and securely 2 minutes   Stand to Sit Sits safely with minimal use of hands   Transfers Able to transfer safely, minor use of hands   Standing Unsupported with Eyes Closed Able to stand 10 seconds safely   Standing Ubsupported with Feet Together Able to place feet together independently and stand 1 minute safely   From Standing, Reach Forward with Outstretched Arm Can reach confidently >25 cm (10")   From Standing Position, Pick up Object from Floor Able to pick up shoe safely and easily   From Standing Position, Turn to Look Behind Over each Shoulder Looks behind from both sides and weight shifts well   Turn 360 Degrees Able to turn 360 degrees safely in 4 seconds or less   Standing Unsupported, Alternately Place Feet on Step/Stool Able to stand independently and safely and complete 8 steps in 20 seconds   Standing Unsupported, One Foot in Front Able to place foot  tandem independently and hold 30 seconds   Standing on One Leg Able to lift leg independently and hold > 10 seconds   Total Score 56                     [facility] Adult PT Treatment/Exercise - 10/19/15 1206    Knee/Hip Exercises: Aerobic   Nustep hill 3, level 3x 8'  Knee/Hip Exercises: Standing   Other Standing Knee Exercises sidestepping with green tband x 3RT                PT Education - 10/19/15 1205    Education provided Yes   Education Details goals and HEP reviewed, HEP updated   Person(s) Educated Patient   Methods Explanation;Handout   Comprehension Verbalized understanding;Returned demonstration          PT Short Term Goals - 10/19/15 1209    PT SHORT TERM GOAL #1   Title Patient will improve 6 minute walk to at least 1250f with no rest breaks and no noticeable decrease in speed/increase in fatigue towards latter part of gait distance    Baseline 11/22- 12466fwith no decrease in speed    Time 3   Period Weeks   Status Achieved   PT SHORT TERM GOAL #2   Title Patient to score at least 52 on BERG balance test to demonstrate reduced fall risk during functional activities    Time 3   Period Weeks   Status Achieved   PT SHORT TERM GOAL #3   Title Patient will be independent in correctly and consistently performing appropriate HEP, to be updated PRN    Time 3   Period Weeks   Status Achieved           PT Long Term Goals - 10/19/15 1209    PT LONG TERM GOAL #1   Title Patient will demonstrate 5/5 strength in all muslces tested in order to promote enhanced balance and functional endurance with reduced fall risk    Time 6   Period Weeks   Status Partially Met   PT LONG TERM GOAL #2   Title Patient will score at least a 54 on the BERG balance test in order to demonstrate significantly reduced fall risk during all functional situations    Time 6   Period Weeks   Status Achieved   PT LONG TERM GOAL #3   Title Patient will be able to  ambulate at least 140037furiung the 6 minute walk test in order to demonstrate improved functional actvity tolerance for improved performance in functional situations    Time 6   Period Weeks   Status Not Met   PT LONG TERM GOAL #4   Title Patient to be performing at least 20 minutes of light-moderate intensity exercise at least 3 days per week in [place] to maintain functional gains and promote overall improved healthy lifestyle    Time 6   Period Weeks   Status Achieved               Plan - 10/19/15 1210    Clinical Impression Statement Reassessment completed today. Pt has made excellent progress in regards to BLE strength, balance, endurance, and functional activity tolerance. Pt did not meet LTG set for 6 minute walk test, but reported that he was having a harder time with increasing his gait speed today due to his chest congestion. Pt has met all other LTGs, and is appropriate for discharge at this time to continue independently with HEP.         Problem List Patient Active Problem List   Diagnosis Date Noted  . Hypokalemia 11/03/2014  . URI (upper respiratory infection) 11/03/2014  . Weakness 11/03/2014  . Obstructive sleep apnea 08/07/2014  . Central sleep apnea 08/07/2014  . Inguinal hernia 05/04/2014  . Chest pain with moderate risk for cardiac etiology 11/25/2013  . Fever  09/13/2013  . Pneumonia 09/13/2013  . Pancytopenia ([place]) 09/13/2013  . Muscle weakness (generalized) 08/04/2013  . Status post arthroscopy of shoulder 08/04/2013  . Pain in joint, shoulder region 08/04/2013  . Rotator cuff tear arthropathy of right shoulder 07/21/2013  . Left rotator cuff tear arthropathy 07/21/2013  . Shortness of breath 07/07/2013  . 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 Hyperlipidemia with target LDL less than 100 10/08/2012  . Prediabetes 10/08/2012  . Expressive aphasia 10/08/2012  . Hemiplegia affecting right dominant side ([place]) 10/08/2012  . H/O cardiac  pacemaker, Medtronic REVO, MRI conditional device, placed 07/2011 for sympyomatic bradycardia 10/07/2012  . Hx of bladder cancer 10/07/2012  . 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 ASTHMA, UNSPECIFIED 03/22/2010  . Nonspecific (abnormal) findings on radiological and other examination of body structure 03/22/2010  . Hx of lymphoma 03/21/2010  . Multiple myeloma in remission ([place]) 03/21/2010  . Anxiety state 03/21/2010  . Essential hypertension 03/21/2010  . MYOCARDIAL INFARCTION 03/21/2010  . Coronary atherosclerosis 03/21/2010  . NEPHROLITHIASIS 03/21/2010  . ELEVATED PROSTATE SPECIFIC ANTIGEN 03/21/2010  . ROTATOR CUFF REPAIR, RIGHT, HX OF 03/21/2010    PHYSICAL THERAPY DISCHARGE SUMMARY  Visits from Start of Care: 13  Current functional level related to goals / functional outcomes: Pt demonstrates improvements in strength, functional activity tolerance, and balance, and has met 3/4 LTGs.    Remaining deficits: Pt continues to demonstrate decreased gait speed evidenced by 6 minute walk test.    Education / Equipment: HEP  Plan: Patient agrees to discharge.  Patient goals were partially met. Patient is being discharged due to being pleased with the current functional level.  ?????       Hilma Favors, PT, DPT [phone] 10/19/2015, 12:12 PM  [facility] [address] [place], [place], [address] Phone: [phone]   Fax:  [phone]  Name: KENDRYCK LACROIX MRN: [id number] Date of Birth: May 04, 1941

## 2015-10-20 ENCOUNTER — Encounter (HOSPITAL_BASED_OUTPATIENT_CLINIC_OR_DEPARTMENT_OTHER): Payer: Medicare Other

## 2015-10-20 ENCOUNTER — Encounter (HOSPITAL_COMMUNITY): Payer: Self-pay

## 2015-10-20 ENCOUNTER — Other Ambulatory Visit (HOSPITAL_COMMUNITY): Payer: Self-pay | Admitting: Hematology & Oncology

## 2015-10-20 ENCOUNTER — Telehealth: Payer: Self-pay | Admitting: Family Medicine

## 2015-10-20 VITALS — BP 136/60 | HR 62 | Temp 97.8°F | Resp 20 | Wt 192.4 lb

## 2015-10-20 DIAGNOSIS — D801 Nonfamilial hypogammaglobulinemia: Secondary | ICD-10-CM | POA: Diagnosis not present

## 2015-10-20 MED ORDER — IMMUNE GLOBULIN (HUMAN) 10 GM/100ML IV SOLN: 0.4000 g/kg | Freq: Once | INTRAVENOUS | Status: DC

## 2015-10-20 MED ORDER — HEPARIN SOD (PORK) LOCK FLUSH 100 UNIT/ML IV SOLN
INTRAVENOUS | Status: AC
  Filled 2015-10-20: qty 5

## 2015-10-20 MED ORDER — HEPARIN SOD (PORK) LOCK FLUSH 100 UNIT/ML IV SOLN: 250.0000 [IU] | Freq: Once | INTRAVENOUS | Status: DC | PRN

## 2015-10-20 MED ORDER — ACETAMINOPHEN 325 MG PO TABS
ORAL_TABLET | ORAL | Status: AC
  Filled 2015-10-20: qty 2

## 2015-10-20 MED ORDER — HEPARIN SOD (PORK) LOCK FLUSH 100 UNIT/ML IV SOLN
500.0000 [IU] | Freq: Once | INTRAVENOUS | Status: AC | PRN
  Administered 2015-10-20: 500 [IU]

## 2015-10-20 MED ORDER — LEVOFLOXACIN 500 MG PO TABS: 500.0000 mg | ORAL_TABLET | Freq: Every day | ORAL | Status: AC

## 2015-10-20 MED ORDER — ACETAMINOPHEN 325 MG PO TABS
650.0000 mg | ORAL_TABLET | Freq: Four times a day (QID) | ORAL | Status: DC | PRN
  Administered 2015-10-20: 650 mg via ORAL

## 2015-10-20 MED ORDER — SODIUM CHLORIDE 0.9 % IJ SOLN
10.0000 mL | INTRAMUSCULAR | Status: DC | PRN
  Administered 2015-10-20: 10 mL
  Filled 2015-10-20: qty 10

## 2015-10-20 MED ORDER — SODIUM CHLORIDE 0.9 % IV SOLN
Freq: Once | INTRAVENOUS | Status: AC
  Administered 2015-10-20: 10:00:00 via INTRAVENOUS

## 2015-10-20 MED ORDER — IMMUNE GLOBULIN (HUMAN) 5 GM/50ML IV SOLN
400.0000 mg/kg | Freq: Once | INTRAVENOUS | Status: AC
  Administered 2015-10-20: 35 g via INTRAVENOUS
  Filled 2015-10-20: qty 50

## 2015-10-20 NOTE — Patient Instructions (Signed)
[place] at [facility] Discharge Instructions  RECOMMENDATIONS MADE BY THE CONSULTANT AND ANY TEST RESULTS WILL BE SENT TO YOUR REFERRING PHYSICIAN.  Today you received IVIG infusion. Return as scheduled for lab work and infusion. Return as scheduled for office visit.   Thank you for choosing [place] at [facility] to provide your oncology and hematology care.  To afford each patient quality time with our provider, please arrive at least 15 minutes before your scheduled appointment time.    You need to re-schedule your appointment should you arrive 10 or more minutes late.  We strive to give you quality time with our providers, and arriving late affects you and other patients whose appointments are after yours.  Also, if you no show three or more times for appointments you may be dismissed from the clinic at the providers discretion.     Again, thank you for choosing [facility].  Our hope is that these requests will decrease the amount of time that you wait before being seen by our physicians.       _____________________________________________________________  Should you have questions after your visit to [facility], please contact our office at [phone] between the hours of 8:30 a.m. and 4:30 p.m.  Voicemails left after 4:30 p.m. will not be returned until the following business day.  For prescription refill requests, have your pharmacy contact our office.

## 2015-10-20 NOTE — Telephone Encounter (Signed)
Pt came in stating that he is not getting any better and wants to either be re seen or something else to be called in.   [facility] [place]

## 2015-10-20 NOTE — Telephone Encounter (Signed)
Rx sent electronically to pharmacy. Patient notified. 

## 2015-10-20 NOTE — Telephone Encounter (Signed)
Lev 500 qd ten d 

## 2015-10-20 NOTE — Telephone Encounter (Signed)
Patient seen 10/13/15- Dx with bronchitis and sinus infection- given rocephin shot with rx of cefzil and prednisone taper

## 2015-10-20 NOTE — Progress Notes (Signed)
Tolerated infusion w/o adverse reaction.  A&Ox4, in no distress.  VSS.

## 2015-10-20 NOTE — Addendum Note (Signed)
Addended by: Dairl Ponder on: 10/20/2015 11:45 AM   Modules accepted: Orders

## 2015-10-23 ENCOUNTER — Other Ambulatory Visit (HOSPITAL_COMMUNITY): Payer: Self-pay | Admitting: Oncology

## 2015-10-23 ENCOUNTER — Ambulatory Visit (HOSPITAL_COMMUNITY): Payer: Medicare Other | Admitting: Specialist

## 2015-10-23 DIAGNOSIS — M6281 Muscle weakness (generalized): Secondary | ICD-10-CM | POA: Diagnosis not present

## 2015-10-23 DIAGNOSIS — M25511 Pain in right shoulder: Secondary | ICD-10-CM | POA: Diagnosis not present

## 2015-10-23 DIAGNOSIS — M25512 Pain in left shoulder: Secondary | ICD-10-CM | POA: Diagnosis not present

## 2015-10-23 DIAGNOSIS — M629 Disorder of muscle, unspecified: Secondary | ICD-10-CM | POA: Diagnosis not present

## 2015-10-23 DIAGNOSIS — M25612 Stiffness of left shoulder, not elsewhere classified: Secondary | ICD-10-CM | POA: Diagnosis not present

## 2015-10-23 DIAGNOSIS — M25611 Stiffness of right shoulder, not elsewhere classified: Secondary | ICD-10-CM | POA: Diagnosis not present

## 2015-10-23 DIAGNOSIS — R062 Wheezing: Secondary | ICD-10-CM

## 2015-10-23 MED ORDER — PREDNISONE 20 MG PO TABS: ORAL_TABLET | ORAL | Status: DC

## 2015-10-23 NOTE — Therapy (Signed)
[place], [place], [address] Phone: [phone]   Fax:  [phone]  Occupational Therapy Treatment  Patient Details  Name: Jeremy Parrish MRN: [id number] Date of Birth: 1941-08-06 Referring Provider: Dr. Whitney Muse  Encounter Date: 10/23/2015      OT End of Session - 10/23/15 1147    Visit Number 7   Number of Visits 12   Date for OT Re-Evaluation 11/20/15   Authorization Type Medicare   Authorization Time Period Before 10th visit   Authorization - Visit Number 7   Authorization - Number of Visits 10   OT Start Time 1106   OT Stop Time 1148   OT Time Calculation (min) 42 min   Activity Tolerance Patient tolerated treatment well   Behavior During Therapy [facility] for tasks assessed/performed      Past Medical History  Diagnosis Date  . Hypertension   . Heart disease   . Kidney stones     history  . Lung mass   . Heart murmur   . 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 Coronary artery disease   . Depression   . Shortness of breath   . Peripheral arterial disease ([place])   . Bladder neck contracture   . Personal history of other diseases of circulatory system   . Aortic aneurysm of unspecified site without mention of rupture   . Arthritis   . Intestinovesical fistula   . Esophageal reflux   . Hyperlipidemia   . Anemia   . CHF (congestive heart failure) ([place])   . COPD (chronic obstructive pulmonary disease) ([place])   . Cerebral atherosclerosis     Carotid Doppler, 02/16/2013 - Bilateral Proximal ICAs,demonstrate mild plaque w/o evidence of significant diameter reduction, dissection, or any other vascular abnormality  . Complication of anesthesia   . PONV (postoperative nausea and vomiting)   . Stroke [facility]) 2013    Speech.  . Hx of bladder cancer 10/07/2012  . Sleep apnea     05-02-14 cpap , not yet used- suggested settings 5  . Cancer  ([place])   . Prostate cancer ([place]) 2000  . Multiple myeloma   . Non Hodgkin's lymphoma ([place])   . Myocardial infarction [facility])     '96    Past Surgical History  Procedure Laterality Date  . Prostate surgery    . Heart stents x 5  1999  . Portacath placement  07/26/2009    right chest  . Wrist surgery      right  . Left ear skin cancer removed    . Bone marrow transplant  2011  . Pacemaker insertion  07/22/2011    Medtronic  . Coronary angioplasty  06/24/2000    PCI and stenting in mid & proximal RCA  . Insert / replace / remove pacemaker    . Tee without cardioversion  10/13/2012    Procedure: TRANSESOPHAGEAL ECHOCARDIOGRAM (TEE);  Surgeon: Sanda Klein, MD;  Location: [facility] ENDOSCOPY;  Service: Cardiovascular;  Laterality: N/A;  pat/kay/echo notified  . Colonoscopy N/A 01/01/2013    Procedure: COLONOSCOPY;  Surgeon: Rogene Houston, MD;  [facility];  Service: Endoscopy;  Laterality: N/A;  825-moved to [place] notified pt  . Rotator    . Rotator cuff repair Right   . Colon surgery      colon resection  . Bladder surgery    . Shoulder arthroscopy with subacromial decompression  Right 07/21/2013    Procedure: RIGHT SHOULDER ARTHROSCOPY WITH SUBACROMIAL DECOMPRESSION AND DEBRIDEMENT & Injection of Left Shoulder;  Surgeon: Alta Corning, MD;  Location: [place];  Service: Orthopedics;  Laterality: Right;  . US echocardiography  06/19/2011    RV mildly dilated,mild to mod. MR,mild AI,mild PI  . Nm myocar perf wall motion  11/27/2007    inferior scar  . Inguinal hernia repair Right 05/04/2014    Procedure: OPEN RIGHT INGUINAL HERNIA REPAIR with mesh;  Surgeon: Edward Jolly, MD;  [facility] ORS;  Service: General;  Laterality: Right;    There were no vitals filed for this visit.  Visit Diagnosis:  Muscle weakness  Pain of both shoulder joints  Stiffness of shoulder joint, left      Subjective Assessment - 10/23/15 1104    Subjective  S:  I think I am doing alot better.    Currently in Pain? No/denies   Pain Score 0-No pain            [facility] OT Assessment - 10/23/15 0001    Assessment   Diagnosis Hx of CVA-shoulder pain   Precautions   Precautions None                  OT Treatments/Exercises (OP) - 10/23/15 0001    Exercises   Exercises Shoulder   Shoulder Exercises: Supine   Protraction PROM;5 reps;Strengthening;10 reps   Protraction Weight (lbs) 1   Horizontal ABduction PROM;5 reps;Strengthening;10 reps   Horizontal ABduction Weight (lbs) 1   External Rotation PROM;5 reps;Strengthening;10 reps   External Rotation Weight (lbs) 1   Internal Rotation PROM;5 reps;Strengthening;10 reps   Internal Rotation Weight (lbs) 1   Flexion PROM;5 reps;Strengthening;10 reps   Shoulder Flexion Weight (lbs) 1   ABduction PROM;5 reps;Strengthening;10 reps   Shoulder ABduction Weight (lbs) 1   Shoulder Exercises: Seated   Protraction Both;AROM;10 reps   Horizontal ABduction Both;AROM;10 reps   Horizontal ABduction Limitations completed one arm at a time with therapist unweightning arm   External Rotation Both;AROM;10 reps   Internal Rotation Both;AROM;10 reps   Flexion Both;AROM;10 reps   Abduction Both;AROM;10 reps   ABduction Limitations completed one arm at a time   Shoulder Exercises: Standing   External Rotation Theraband;15 reps   Theraband Level (Shoulder External Rotation) Level 3 (Green)   Internal Rotation Theraband;15 reps   Theraband Level (Shoulder Internal Rotation) Level 3 (Green)   Extension Theraband;10 reps   Theraband Level (Shoulder Extension) Level 3 (Green)   Row [employer] reps   Theraband Level (Shoulder Row) Level 3 (Green)   Retraction Theraband;15 reps   Theraband Level (Shoulder Retraction) Level 3 (Green)   Shoulder Exercises: ROM/Strengthening   UBE (Upper Arm Bike) 3' forward and 3' reverse at 1.0   Wall Wash 2 minutes each without resting   Proximal Shoulder Strengthening, Supine 10 X each without  resting   Manual Therapy   Manual Therapy Myofascial release   Manual therapy comments Manual therapy completed prior to therapeutic exercises   Myofascial Release Myofascial release to bilateral upper arms, trapezius, and scapularis regions to decrease pain and fascial restrictions, increase joint mobility.                   OT Short Term Goals - 10/19/15 1218    OT SHORT TERM GOAL #1   Title Pt will be educated on and independent in HEP.    Time 6   Period Weeks   Status  On-going   OT SHORT TERM GOAL #2   Title Pt will decrease fascial restrictions from mod to min amounts in BUE to decrease pain and increase range of motion.   Time 6   Period Weeks   Status On-going   OT SHORT TERM GOAL #3   Title Pt will decrease pain to 3/10 or less in BUE to increase ability to use during daily tasks.    Time 6   Period Weeks   Status On-going   OT SHORT TERM GOAL #4   Title Pt will increase A/ROM to [facility] to increase ability to reach into high cabinets during daily tasks.    Time 6   Period Weeks   Status On-going   OT SHORT TERM GOAL #5   Title Pt will increase strength to 4+/5 to increase ability to complete gardening and household chores using BUE.    Time 6   Period Weeks   Status On-going                  Plan - 10/23/15 1147    Clinical Impression Statement A: Patient demonstrating increased A/ROM this date.  Initated strengthening in supine and added theraband in standing for proximal shoulder stablity.   Plan P:  Increase repetitions iwth strengthening, decrease amount of facilitation needed iwth seated A/ROM as proximal shoulder strength improves.         Problem List Patient Active Problem List   Diagnosis Date Noted  . Hypokalemia 11/03/2014  . URI (upper respiratory infection) 11/03/2014  . Weakness 11/03/2014  . Obstructive sleep apnea 08/07/2014  . Central sleep apnea 08/07/2014  . Inguinal hernia 05/04/2014  . Chest pain with moderate risk  for cardiac etiology 11/25/2013  . Fever 09/13/2013  . Pneumonia 09/13/2013  . Pancytopenia ([place]) 09/13/2013  . Muscle weakness (generalized) 08/04/2013  . Status post arthroscopy of shoulder 08/04/2013  . Pain in joint, shoulder region 08/04/2013  . Rotator cuff tear arthropathy of right shoulder 07/21/2013  . Left rotator cuff tear arthropathy 07/21/2013  . Shortness of breath 07/07/2013  . 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 Hyperlipidemia with target LDL less than 100 10/08/2012  . Prediabetes 10/08/2012  . Expressive aphasia 10/08/2012  . Hemiplegia affecting right dominant side ([place]) 10/08/2012  . 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 Hx of bladder cancer 10/07/2012  . 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 ASTHMA, UNSPECIFIED 03/22/2010  . Nonspecific (abnormal) findings on radiological and other examination of body structure 03/22/2010  . Hx of lymphoma 03/21/2010  . Multiple myeloma in remission ([place]) 03/21/2010  . Anxiety state 03/21/2010  . Essential hypertension 03/21/2010  . MYOCARDIAL INFARCTION 03/21/2010  . Coronary atherosclerosis 03/21/2010  . NEPHROLITHIASIS 03/21/2010  . ELEVATED PROSTATE SPECIFIC ANTIGEN 03/21/2010  . ROTATOR CUFF REPAIR, RIGHT, HX OF 03/21/2010    Vangie Bicker, OTR/L [phone]  10/23/2015, 11:50 AM  [facility] [address] [place], [place], [address] Phone: [phone]   Fax:  [phone]  Name: JIBRI SCHRIEFER MRN: [id number] Date of Birth: Jun 05, 1941

## 2015-10-24 ENCOUNTER — Ambulatory Visit (INDEPENDENT_AMBULATORY_CARE_PROVIDER_SITE_OTHER): Payer: Medicare Other | Admitting: Family Medicine

## 2015-10-24 VITALS — Temp 98.7°F | Ht 68.0 in | Wt 186.4 lb

## 2015-10-24 DIAGNOSIS — R062 Wheezing: Secondary | ICD-10-CM | POA: Diagnosis not present

## 2015-10-24 DIAGNOSIS — I2510 Atherosclerotic heart disease of native coronary artery without angina pectoris: Secondary | ICD-10-CM

## 2015-10-24 DIAGNOSIS — J209 Acute bronchitis, unspecified: Secondary | ICD-10-CM | POA: Diagnosis not present

## 2015-10-24 MED ORDER — CEFDINIR 300 MG PO CAPS: 300.0000 mg | ORAL_CAPSULE | Freq: Two times a day (BID) | ORAL | Status: DC

## 2015-10-24 NOTE — Progress Notes (Signed)
   Subjective:    Patient ID: Jeremy Parrish, male    DOB: February 18, 1941, 74 y.o.   MRN: [id number]  Cough This is a new problem. The current episode started 1 to 4 weeks ago. Associated symptoms include nasal congestion. Treatments tried: rocephin, cefzil, levaquin, tessalon    Patient just recently had an immunoglobulin infusion.  Continues to have productive cough.  Wheezes at times  No major fever or chills Review of Systems  Respiratory: Positive for cough.    no vomiting or diarrhea     Objective:   Physical Exam  Alert moderate malaise vital stable HEENT moderate his congestion lungs bilateral wheezes mild positive bronchial sounds no inspiratory crackles heart rare rhythm      Assessment & Plan:  Impression persistent bronchitis with reactive airways and immune system disorder plan antibiotics prescribed. Prednisone taper 1 skin albuterol when necessary 1 signs discussed WSL

## 2015-11-01 ENCOUNTER — Ambulatory Visit (HOSPITAL_COMMUNITY): Payer: Medicare Other

## 2015-11-01 ENCOUNTER — Encounter (HOSPITAL_COMMUNITY): Payer: Self-pay

## 2015-11-01 DIAGNOSIS — M25511 Pain in right shoulder: Secondary | ICD-10-CM | POA: Diagnosis not present

## 2015-11-01 DIAGNOSIS — M25612 Stiffness of left shoulder, not elsewhere classified: Secondary | ICD-10-CM | POA: Diagnosis not present

## 2015-11-01 DIAGNOSIS — M629 Disorder of muscle, unspecified: Secondary | ICD-10-CM | POA: Diagnosis not present

## 2015-11-01 DIAGNOSIS — M6281 Muscle weakness (generalized): Secondary | ICD-10-CM

## 2015-11-01 DIAGNOSIS — M25611 Stiffness of right shoulder, not elsewhere classified: Secondary | ICD-10-CM | POA: Diagnosis not present

## 2015-11-01 DIAGNOSIS — M25512 Pain in left shoulder: Secondary | ICD-10-CM | POA: Diagnosis not present

## 2015-11-01 NOTE — Therapy (Signed)
[place], [place], [address] Phone: [phone]   Fax:  [phone]  Occupational Therapy Treatment  Patient Details  Name: Jeremy Parrish MRN: [id number] Date of Birth: January 16, 1941 Referring Provider: Dr. Whitney Muse  Encounter Date: 11/01/2015      OT End of Session - 11/01/15 1522    Visit Number 8   Number of Visits 12   Date for OT Re-Evaluation 11/20/15   Authorization Type Medicare   Authorization Time Period Before 10th visit   Authorization - Visit Number 8   Authorization - Number of Visits 10   OT Start Time 1350   OT Stop Time 1430   OT Time Calculation (min) 40 min   Activity Tolerance Patient tolerated treatment well   Behavior During Therapy [facility] for tasks assessed/performed      Past Medical History  Diagnosis Date  . Hypertension   . Heart disease   . Kidney stones     history  . Lung mass   . Heart murmur   . 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 Coronary artery disease   . Depression   . Shortness of breath   . Peripheral arterial disease ([place])   . Bladder neck contracture   . Personal history of other diseases of circulatory system   . Aortic aneurysm of unspecified site without mention of rupture   . Arthritis   . Intestinovesical fistula   . Esophageal reflux   . Hyperlipidemia   . Anemia   . CHF (congestive heart failure) ([place])   . COPD (chronic obstructive pulmonary disease) ([place])   . Cerebral atherosclerosis     Carotid Doppler, 02/16/2013 - Bilateral Proximal ICAs,demonstrate mild plaque w/o evidence of significant diameter reduction, dissection, or any other vascular abnormality  . Complication of anesthesia   . PONV (postoperative nausea and vomiting)   . Stroke [facility]) 2013    Speech.  . Hx of bladder cancer 10/07/2012  . Sleep apnea     05-02-14 cpap , not yet used- suggested settings 5  . Cancer  ([place])   . Prostate cancer ([place]) 2000  . Multiple myeloma   . Non Hodgkin's lymphoma ([place])   . Myocardial infarction [facility])     '96    Past Surgical History  Procedure Laterality Date  . Prostate surgery    . Heart stents x 5  1999  . Portacath placement  07/26/2009    right chest  . Wrist surgery      right  . Left ear skin cancer removed    . Bone marrow transplant  2011  . Pacemaker insertion  07/22/2011    Medtronic  . Coronary angioplasty  06/24/2000    PCI and stenting in mid & proximal RCA  . Insert / replace / remove pacemaker    . Tee without cardioversion  10/13/2012    Procedure: TRANSESOPHAGEAL ECHOCARDIOGRAM (TEE);  Surgeon: Sanda Klein, MD;  Location: [facility] ENDOSCOPY;  Service: Cardiovascular;  Laterality: N/A;  pat/kay/echo notified  . Colonoscopy N/A 01/01/2013    Procedure: COLONOSCOPY;  Surgeon: Rogene Houston, MD;  [facility];  Service: Endoscopy;  Laterality: N/A;  825-moved to [place] notified pt  . Rotator    . Rotator cuff repair Right   . Colon surgery      colon resection  . Bladder surgery    . Shoulder arthroscopy with subacromial decompression  Right 07/21/2013    Procedure: RIGHT SHOULDER ARTHROSCOPY WITH SUBACROMIAL DECOMPRESSION AND DEBRIDEMENT & Injection of Left Shoulder;  Surgeon: Alta Corning, MD;  Location: [place];  Service: Orthopedics;  Laterality: Right;  . US echocardiography  06/19/2011    RV mildly dilated,mild to mod. MR,mild AI,mild PI  . Nm myocar perf wall motion  11/27/2007    inferior scar  . Inguinal hernia repair Right 05/04/2014    Procedure: OPEN RIGHT INGUINAL HERNIA REPAIR with mesh;  Surgeon: Edward Jolly, MD;  [facility];  Service: General;  Laterality: Right;    There were no vitals filed for this visit.  Visit Diagnosis:  Muscle weakness  Stiffness of shoulder joint, left      Subjective Assessment - 11/01/15 1412    Subjective  S: My arms feel a little stiff today.   Currently in Pain?  No/denies            [facility] OT Assessment - 11/01/15 1414    Assessment   Diagnosis Hx of CVA-shoulder pain   Precautions   Precautions None                  OT Treatments/Exercises (OP) - 11/01/15 1414    Exercises   Exercises Shoulder   Shoulder Exercises: Supine   Protraction PROM;5 reps;Strengthening;10 reps   Protraction Weight (lbs) 1   Horizontal ABduction PROM;5 reps;Strengthening;10 reps   Horizontal ABduction Weight (lbs) 1   External Rotation PROM;5 reps;Strengthening;10 reps   External Rotation Weight (lbs) 1   Internal Rotation PROM;5 reps;Strengthening;10 reps   Internal Rotation Weight (lbs) 1   Flexion PROM;5 reps;Strengthening;10 reps   Shoulder Flexion Weight (lbs) 1   ABduction PROM;5 reps;Strengthening;10 reps   Shoulder ABduction Weight (lbs) 1   Shoulder Exercises: Standing   Protraction AROM;12 reps   Horizontal ABduction AAROM;12 reps;Limitations  Pt required to break 12X into 2 sets to complete.   External Rotation AROM;12 reps   Internal Rotation AROM;12 reps   Flexion AROM;12 reps   ABduction AROM;12 reps;Limitations  Pt was able to complete 25% range with prior form.    Shoulder Exercises: ROM/Strengthening   UBE (Upper Arm Bike) 3' forward and 3' reverse at 1.0   Wall Wash 2 minutes each without resting   Proximal Shoulder Strengthening, Supine 10 X with 1# without resting   Manual Therapy   Manual Therapy Myofascial release   Manual therapy comments Manual therapy completed prior to therapeutic exercises   Myofascial Release Myofascial release to bilateral upper arms, trapezius, and scapularis regions to decrease pain and fascial restrictions, increase joint mobility.                   OT Short Term Goals - 10/19/15 1218    OT SHORT TERM GOAL #1   Title Pt will be educated on and independent in HEP.    Time 6   Period Weeks   Status On-going   OT SHORT TERM GOAL #2   Title Pt will decrease fascial restrictions  from mod to min amounts in BUE to decrease pain and increase range of motion.   Time 6   Period Weeks   Status On-going   OT SHORT TERM GOAL #3   Title Pt will decrease pain to 3/10 or less in BUE to increase ability to use during daily tasks.    Time 6   Period Weeks   Status On-going   OT SHORT TERM GOAL #4  Title Pt will increase A/ROM to [facility] to increase ability to reach into high cabinets during daily tasks.    Time 6   Period Weeks   Status On-going   OT SHORT TERM GOAL #5   Title Pt will increase strength to 4+/5 to increase ability to complete gardening and household chores using BUE.    Time 6   Period Weeks   Status On-going                  Plan - 11/01/15 1522    Clinical Impression Statement A: Increased repetitions with strengthening to 12X supine. Pt required increased VC for proper form and technique when standing and completing A/ROM. Frequently patient would use substitution movementsin UB to complete exercises and required cueing to stop and focus on form.   Plan P: Decrease amount of cueing needed to complete A/ROM standing exercises. Add scapular theraband exercises and ball on the wall to improve proximal shoulder strength.        Problem List Patient Active Problem List   Diagnosis Date Noted  . Hypokalemia 11/03/2014  . URI (upper respiratory infection) 11/03/2014  . Weakness 11/03/2014  . Obstructive sleep apnea 08/07/2014  . Central sleep apnea 08/07/2014  . Inguinal hernia 05/04/2014  . Chest pain with moderate risk for cardiac etiology 11/25/2013  . Fever 09/13/2013  . Pneumonia 09/13/2013  . Pancytopenia ([place]) 09/13/2013  . Muscle weakness (generalized) 08/04/2013  . Status post arthroscopy of shoulder 08/04/2013  . Pain in joint, shoulder region 08/04/2013  . Rotator cuff tear arthropathy of right shoulder 07/21/2013  . Left rotator cuff tear arthropathy 07/21/2013  . Shortness of breath 07/07/2013  . Aneurysm of iliac artery  ([place]) 01/26/2013  . Hyperlipidemia with target LDL less than 100 10/08/2012  . Prediabetes 10/08/2012  . Expressive aphasia 10/08/2012  . Hemiplegia affecting right dominant side ([place]) 10/08/2012  . 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 Hx of bladder cancer 10/07/2012  . 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 ASTHMA, UNSPECIFIED 03/22/2010  . Nonspecific (abnormal) findings on radiological and other examination of body structure 03/22/2010  . Hx of lymphoma 03/21/2010  . Multiple myeloma in remission ([place]) 03/21/2010  . Anxiety state 03/21/2010  . Essential hypertension 03/21/2010  . MYOCARDIAL INFARCTION 03/21/2010  . Coronary atherosclerosis 03/21/2010  . NEPHROLITHIASIS 03/21/2010  . ELEVATED PROSTATE SPECIFIC ANTIGEN 03/21/2010  . ROTATOR CUFF REPAIR, RIGHT, HX OF 03/21/2010    Ailene Ravel, OTR/L,CBIS  [phone]  11/01/2015, 3:29 PM  [facility] [address] [place], [place], [address] Phone: [phone]   Fax:  [phone]  Name: Jeremy Parrish MRN: [id number] Date of Birth: Oct 06, 1941

## 2015-11-03 ENCOUNTER — Ambulatory Visit (HOSPITAL_COMMUNITY): Payer: Medicare Other | Admitting: Specialist

## 2015-11-03 DIAGNOSIS — M25611 Stiffness of right shoulder, not elsewhere classified: Secondary | ICD-10-CM | POA: Diagnosis not present

## 2015-11-03 DIAGNOSIS — M25512 Pain in left shoulder: Secondary | ICD-10-CM | POA: Diagnosis not present

## 2015-11-03 DIAGNOSIS — M25612 Stiffness of left shoulder, not elsewhere classified: Secondary | ICD-10-CM | POA: Diagnosis not present

## 2015-11-03 DIAGNOSIS — M629 Disorder of muscle, unspecified: Secondary | ICD-10-CM | POA: Diagnosis not present

## 2015-11-03 DIAGNOSIS — M6281 Muscle weakness (generalized): Secondary | ICD-10-CM

## 2015-11-03 DIAGNOSIS — M25511 Pain in right shoulder: Secondary | ICD-10-CM | POA: Diagnosis not present

## 2015-11-03 NOTE — Therapy (Signed)
[place], [place], [address] Phone: [phone]   Fax:  [phone]  Occupational Therapy Treatment  Patient Details  Name: Jeremy Parrish MRN: [id number] Date of Birth: 09/10/1941 Referring Provider: Dr. Whitney Muse  Encounter Date: 11/03/2015      OT End of Session - 11/03/15 1059    Visit Number 9   Number of Visits 12   Date for OT Re-Evaluation 11/20/15   Authorization Type Medicare   Authorization Time Period Before 10th visit   Authorization - Visit Number 9   Authorization - Number of Visits 10   OT Start Time 1023   OT Stop Time 1107   OT Time Calculation (min) 44 min   Activity Tolerance Patient tolerated treatment well   Behavior During Therapy [facility] for tasks assessed/performed      Past Medical History  Diagnosis Date  . Hypertension   . Heart disease   . Kidney stones     history  . Lung mass   . Heart murmur   . 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 Coronary artery disease   . Depression   . Shortness of breath   . Peripheral arterial disease ([place])   . Bladder neck contracture   . Personal history of other diseases of circulatory system   . Aortic aneurysm of unspecified site without mention of rupture   . Arthritis   . Intestinovesical fistula   . Esophageal reflux   . Hyperlipidemia   . Anemia   . CHF (congestive heart failure) ([place])   . COPD (chronic obstructive pulmonary disease) ([place])   . Cerebral atherosclerosis     Carotid Doppler, 02/16/2013 - Bilateral Proximal ICAs,demonstrate mild plaque w/o evidence of significant diameter reduction, dissection, or any other vascular abnormality  . Complication of anesthesia   . PONV (postoperative nausea and vomiting)   . Stroke [facility]) 2013    Speech.  . Hx of bladder cancer 10/07/2012  . Sleep apnea     05-02-14 cpap , not yet used- suggested settings 5  . Cancer  ([place])   . Prostate cancer ([place]) 2000  . Multiple myeloma   . Non Hodgkin's lymphoma ([place])   . Myocardial infarction [facility])     '96    Past Surgical History  Procedure Laterality Date  . Prostate surgery    . Heart stents x 5  1999  . Portacath placement  07/26/2009    right chest  . Wrist surgery      right  . Left ear skin cancer removed    . Bone marrow transplant  2011  . Pacemaker insertion  07/22/2011    Medtronic  . Coronary angioplasty  06/24/2000    PCI and stenting in mid & proximal RCA  . Insert / replace / remove pacemaker    . Tee without cardioversion  10/13/2012    Procedure: TRANSESOPHAGEAL ECHOCARDIOGRAM (TEE);  Surgeon: Sanda Klein, MD;  Location: [facility] ENDOSCOPY;  Service: Cardiovascular;  Laterality: N/A;  pat/kay/echo notified  . Colonoscopy N/A 01/01/2013    Procedure: COLONOSCOPY;  Surgeon: Rogene Houston, MD;  [facility] ENDO [facility];  Service: Endoscopy;  Laterality: N/A;  825-moved to [place] notified pt  . Rotator    . Rotator cuff repair Right   . Colon surgery      colon resection  . Bladder surgery    . Shoulder arthroscopy with subacromial decompression  Right 07/21/2013    Procedure: RIGHT SHOULDER ARTHROSCOPY WITH SUBACROMIAL DECOMPRESSION AND DEBRIDEMENT & Injection of Left Shoulder;  Surgeon: Alta Corning, MD;  Location: [place];  Service: Orthopedics;  Laterality: Right;  . US echocardiography  06/19/2011    RV mildly dilated,mild to mod. MR,mild AI,mild PI  . Nm myocar perf wall motion  11/27/2007    inferior scar  . Inguinal hernia repair Right 05/04/2014    Procedure: OPEN RIGHT INGUINAL HERNIA REPAIR with mesh;  Surgeon: Edward Jolly, MD;  Location: [facility] ORS;  Service: General;  Laterality: Right;    There were no vitals filed for this visit.  Visit Diagnosis:  Muscle weakness  Stiffness of shoulder joint, left  Pain of both shoulder joints      Subjective Assessment - 11/03/15 1035    Subjective  S: I had a hard time with the  exercises the o ther day.    Currently in Pain? Yes   Pain Score 2    Pain Location Shoulder   Pain Orientation Left;Right   Pain Descriptors / Indicators Aching   Pain Type Chronic pain            [facility] OT Assessment - 11/03/15 0001    Assessment   Diagnosis Hx of CVA-shoulder pain   Precautions   Precautions None                  OT Treatments/Exercises (OP) - 11/03/15 0001    Exercises   Exercises Shoulder   Shoulder Exercises: Supine   Protraction PROM;10 reps;AROM;15 reps   Horizontal ABduction PROM;10 reps;AROM;15 reps   External Rotation PROM;10 reps;AROM;15 reps   Internal Rotation PROM;10 reps;AROM;15 reps   Flexion PROM;10 reps;AROM;15 reps   ABduction PROM;10 reps;AROM;15 reps   Other Supine Exercises serratus anterior punch 15 times A/ROM   Shoulder Exercises: Seated   Protraction AAROM;15 reps   Horizontal ABduction AAROM;15 reps   External Rotation AROM;15 reps   Internal Rotation AROM;15 reps   Flexion AAROM;15 reps   Abduction AAROM;15 reps   Shoulder Exercises: ROM/Strengthening   UBE (Upper Arm Bike) 3' forward and 3' reverse at 2.0   Other ROM/Strengthening Exercises graduated pinch tree placed with right arm and removed with left arm   Manual Therapy   Manual Therapy Myofascial release   Manual therapy comments Manual therapy completed prior to therapeutic exercises   Myofascial Release Myofascial release to bilateral upper arms, trapezius, and scapularis regions to decrease pain and fascial restrictions, increase joint mobility.                   OT Short Term Goals - 10/19/15 1218    OT SHORT TERM GOAL #1   Title Pt will be educated on and independent in HEP.    Time 6   Period Weeks   Status On-going   OT SHORT TERM GOAL #2   Title Pt will decrease fascial restrictions from mod to min amounts in BUE to decrease pain and increase range of motion.   Time 6   Period Weeks   Status On-going   OT SHORT TERM GOAL #3    Title Pt will decrease pain to 3/10 or less in BUE to increase ability to use during daily tasks.    Time 6   Period Weeks   Status On-going   OT SHORT TERM GOAL #4   Title Pt will increase A/ROM to [facility] to increase ability to reach into high cabinets during  daily tasks.    Time 6   Period Weeks   Status On-going   OT SHORT TERM GOAL #5   Title Pt will increase strength to 4+/5 to increase ability to complete gardening and household chores using BUE.    Time 6   Period Weeks   Status On-going                  Plan - 11/03/15 1059    Clinical Impression Statement A:  completed A/ROM in supine and AA/ROM in seated for better form vs strengthening and A/ROM.  paitent able to complete all exercises independently with rest breaks, except for horizontal abduction.   Plan P:  Update g code, add scapular theraband and improve ability to reach overhead with pinch tree task.         Problem List Patient Active Problem List   Diagnosis Date Noted  . Hypokalemia 11/03/2014  . URI (upper respiratory infection) 11/03/2014  . Weakness 11/03/2014  . Obstructive sleep apnea 08/07/2014  . Central sleep apnea 08/07/2014  . Inguinal hernia 05/04/2014  . Chest pain with moderate risk for cardiac etiology 11/25/2013  . Fever 09/13/2013  . Pneumonia 09/13/2013  . Pancytopenia ([place]) 09/13/2013  . Muscle weakness (generalized) 08/04/2013  . Status post arthroscopy of shoulder 08/04/2013  . Pain in joint, shoulder region 08/04/2013  . Rotator cuff tear arthropathy of right shoulder 07/21/2013  . Left rotator cuff tear arthropathy 07/21/2013  . Shortness of breath 07/07/2013  . 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 Hyperlipidemia with target LDL less than 100 10/08/2012  . Prediabetes 10/08/2012  . Expressive aphasia 10/08/2012  . Hemiplegia affecting right dominant side ([place]) 10/08/2012  . H/O cardiac pacemaker, Medtronic REVO, MRI conditional device, placed 07/2011 for  sympyomatic bradycardia 10/07/2012  . Hx of bladder cancer 10/07/2012  . 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 ASTHMA, UNSPECIFIED 03/22/2010  . Nonspecific (abnormal) findings on radiological and other examination of body structure 03/22/2010  . Hx of lymphoma 03/21/2010  . Multiple myeloma in remission ([place]) 03/21/2010  . Anxiety state 03/21/2010  . Essential hypertension 03/21/2010  . MYOCARDIAL INFARCTION 03/21/2010  . Coronary atherosclerosis 03/21/2010  . NEPHROLITHIASIS 03/21/2010  . ELEVATED PROSTATE SPECIFIC ANTIGEN 03/21/2010  . ROTATOR CUFF REPAIR, RIGHT, HX OF 03/21/2010    Vangie Bicker, OTR/L [phone]  11/03/2015, 11:03 AM  [place] [address] [place], [place], [address] Phone: [phone]   Fax:  [phone]  Name: Jeremy Parrish MRN: [id number] Date of Birth: 1941-10-16

## 2015-11-08 ENCOUNTER — Ambulatory Visit (HOSPITAL_COMMUNITY): Payer: Medicare Other | Admitting: Occupational Therapy

## 2015-11-08 ENCOUNTER — Encounter (HOSPITAL_COMMUNITY): Payer: Self-pay | Admitting: Occupational Therapy

## 2015-11-08 DIAGNOSIS — M629 Disorder of muscle, unspecified: Secondary | ICD-10-CM

## 2015-11-08 DIAGNOSIS — M25511 Pain in right shoulder: Secondary | ICD-10-CM | POA: Diagnosis not present

## 2015-11-08 DIAGNOSIS — M6281 Muscle weakness (generalized): Secondary | ICD-10-CM | POA: Diagnosis not present

## 2015-11-08 DIAGNOSIS — M25611 Stiffness of right shoulder, not elsewhere classified: Secondary | ICD-10-CM

## 2015-11-08 DIAGNOSIS — M6289 Other specified disorders of muscle: Secondary | ICD-10-CM

## 2015-11-08 DIAGNOSIS — M25512 Pain in left shoulder: Secondary | ICD-10-CM | POA: Diagnosis not present

## 2015-11-08 DIAGNOSIS — M25612 Stiffness of left shoulder, not elsewhere classified: Secondary | ICD-10-CM | POA: Diagnosis not present

## 2015-11-08 NOTE — Therapy (Signed)
[place], [place], [address] Phone: [phone]   Fax:  [phone]  Occupational Therapy Treatment  Patient Details  Name: Jeremy Parrish MRN: [id number] Date of Birth: 09-Sep-1941 Referring Provider: Dr. Whitney Muse  Encounter Date: 11/08/2015      OT End of Session - 11/08/15 1511    Visit Number 10   Number of Visits 12   Date for OT Re-Evaluation 11/20/15   Authorization Type Medicare   Authorization Time Period Before 20th visit   Authorization - Visit Number 10   Authorization - Number of Visits 10   OT Start Time 1432   OT Stop Time 1517   OT Time Calculation (min) 45 min   Activity Tolerance Patient tolerated treatment well   Behavior During Therapy [facility] for tasks assessed/performed      Past Medical History  Diagnosis Date  . Hypertension   . Heart disease   . Kidney stones     history  . Lung mass   . Heart murmur   . 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 Coronary artery disease   . Depression   . Shortness of breath   . Peripheral arterial disease ([place])   . Bladder neck contracture   . Personal history of other diseases of circulatory system   . Aortic aneurysm of unspecified site without mention of rupture   . Arthritis   . Intestinovesical fistula   . Esophageal reflux   . Hyperlipidemia   . Anemia   . CHF (congestive heart failure) ([place])   . COPD (chronic obstructive pulmonary disease) ([place])   . Cerebral atherosclerosis     Carotid Doppler, 02/16/2013 - Bilateral Proximal ICAs,demonstrate mild plaque w/o evidence of significant diameter reduction, dissection, or any other vascular abnormality  . Complication of anesthesia   . PONV (postoperative nausea and vomiting)   . Stroke [facility]) 2013    Speech.  . Hx of bladder cancer 10/07/2012  . Sleep apnea     05-02-14 cpap , not yet used- suggested settings 5  .  Cancer ([place])   . Prostate cancer ([place]) 2000  . Multiple myeloma   . Non Hodgkin's lymphoma ([place])   . Myocardial infarction [facility])     '96    Past Surgical History  Procedure Laterality Date  . Prostate surgery    . Heart stents x 5  1999  . Portacath placement  07/26/2009    right chest  . Wrist surgery      right  . Left ear skin cancer removed    . Bone marrow transplant  2011  . Pacemaker insertion  07/22/2011    Medtronic  . Coronary angioplasty  06/24/2000    PCI and stenting in mid & proximal RCA  . Insert / replace / remove pacemaker    . Tee without cardioversion  10/13/2012    Procedure: TRANSESOPHAGEAL ECHOCARDIOGRAM (TEE);  Surgeon: Sanda Klein, MD;  Location: [facility] ENDOSCOPY;  Service: Cardiovascular;  Laterality: N/A;  pat/kay/echo notified  . Colonoscopy N/A 01/01/2013    Procedure: COLONOSCOPY;  Surgeon: Rogene Houston, MD;  [facility];  Service: Endoscopy;  Laterality: N/A;  825-moved to [place] notified pt  . Rotator    . Rotator cuff repair Right   . Colon surgery      colon resection  . Bladder surgery    . Shoulder arthroscopy with subacromial decompression  Right 07/21/2013    Procedure: RIGHT SHOULDER ARTHROSCOPY WITH SUBACROMIAL DECOMPRESSION AND DEBRIDEMENT & Injection of Left Shoulder;  Surgeon: Alta Corning, MD;  Location: [place];  Service: Orthopedics;  Laterality: Right;  . US echocardiography  06/19/2011    RV mildly dilated,mild to mod. MR,mild AI,mild PI  . Nm myocar perf wall motion  11/27/2007    inferior scar  . Inguinal hernia repair Right 05/04/2014    Procedure: OPEN RIGHT INGUINAL HERNIA REPAIR with mesh;  Surgeon: Edward Jolly, MD;  [facility] ORS;  Service: General;  Laterality: Right;    There were no vitals filed for this visit.  Visit Diagnosis:  Muscle weakness  Stiffness of shoulder joint, left  Pain of both shoulder joints  Tight fascia  Stiffness of shoulder joint, right      Subjective Assessment -  11/08/15 1434    Subjective  S: I'm feeling pretty good today.    Special Tests DASH 18.18   Currently in Pain? Yes   Pain Score 2    Pain Location Shoulder   Pain Orientation Right;Left   Pain Descriptors / Indicators Aching   Pain Type Chronic pain            [facility] OT Assessment - 11/08/15 1431    Assessment   Diagnosis Hx of CVA-shoulder pain   Precautions   Precautions None            Katina Dung - 11/08/15 1515    Open a tight or new jar No difficulty   Do heavy household chores (wash walls, wash floors) Mild difficulty   Carry a shopping bag or briefcase Mild difficulty   Wash your back Mild difficulty   Use a knife to cut food No difficulty   Recreational activities in which you take some force or impact through your arm, shoulder, or hand (golf, hammering, tennis) Moderate difficulty   During the past week, to what extent has your arm, shoulder or hand problem interfered with your normal social activities with family, friends, neighbors, or groups? Not at all   During the past week, to what extent has your arm, shoulder or hand problem limited your work or other regular daily activities Slightly   Arm, shoulder, or hand pain. Mild   Tingling (pins and needles) in your arm, shoulder, or hand None   Difficulty Sleeping Mild difficulty   DASH Score 18.18 %              OT Treatments/Exercises (OP) - 11/08/15 1436    Exercises   Exercises Shoulder   Shoulder Exercises: Supine   Protraction PROM;5 reps   Horizontal ABduction PROM;5 reps   External Rotation PROM;5 reps   Internal Rotation PROM;5 reps   Flexion PROM;5 reps   ABduction PROM;5 reps   Shoulder Exercises: Seated   Protraction AAROM;15 reps   Horizontal ABduction AAROM;15 reps   External Rotation AROM;15 reps   Internal Rotation AROM;15 reps   Flexion AAROM;15 reps   Abduction AAROM;15 reps   Shoulder Exercises: Standing   Extension Theraband;10 reps   Theraband Level (Shoulder  Extension) Level 3 (Green)   Row [employer] reps   Theraband Level (Shoulder Row) Level 3 (Green)   Retraction Theraband;15 reps   Theraband Level (Shoulder Retraction) Level 3 (Green)   Shoulder Exercises: ROM/Strengthening   UBE (Upper Arm Bike) 3' forward and 3' reverse at 2.0  cuing to keep speed at 6.0-7.0   Other ROM/Strengthening Exercises graduated pinch tree placed  with left arm and removed with right arm. Pt able to place pins up to approximately 6 inches from top of vertical pole. Multiple rest breaks due to fatigue                  OT Short Term Goals - 10/19/15 1218    OT SHORT TERM GOAL #1   Title Pt will be educated on and independent in HEP.    Time 6   Period Weeks   Status On-going   OT SHORT TERM GOAL #2   Title Pt will decrease fascial restrictions from mod to min amounts in BUE to decrease pain and increase range of motion.   Time 6   Period Weeks   Status On-going   OT SHORT TERM GOAL #3   Title Pt will decrease pain to 3/10 or less in BUE to increase ability to use during daily tasks.    Time 6   Period Weeks   Status On-going   OT SHORT TERM GOAL #4   Title Pt will increase A/ROM to [facility] to increase ability to reach into high cabinets during daily tasks.    Time 6   Period Weeks   Status On-going   OT SHORT TERM GOAL #5   Title Pt will increase strength to 4+/5 to increase ability to complete gardening and household chores using BUE.    Time 6   Period Weeks   Status On-going                  Plan - 11/24/15 1605    Clinical Impression Statement A: Resumed scaplar theraband this session, pt required cuing for form and technique, completed pinch tree task pt with increased difficulty when using LUE. Pt required intermittent rest breaks during session due to fatigue.    Plan P: Continue working on independence in exercises, add overhead lacing for increased ability to reach overhead.           G-Codes - 11/24/15 1607     Functional Assessment Tool Used clinical judgement   Functional Limitation Carrying, moving and handling objects   Carrying, Moving and Handling Objects Current Status [phone]) At least 20 percent but less than 40 percent impaired, limited or restricted   Carrying, Moving and Handling Objects Goal Status (X1062) At least 1 percent but less than 20 percent impaired, limited or restricted      Problem List Patient Active Problem List   Diagnosis Date Noted  . Hypokalemia 11/03/2014  . URI (upper respiratory infection) 11/03/2014  . Weakness 11/03/2014  . Obstructive sleep apnea 08/07/2014  . Central sleep apnea 08/07/2014  . Inguinal hernia 05/04/2014  . Chest pain with moderate risk for cardiac etiology 11/25/2013  . Fever 09/13/2013  . Pneumonia 09/13/2013  . Pancytopenia ([place]) 09/13/2013  . Muscle weakness (generalized) 08/04/2013  . Status post arthroscopy of shoulder 08/04/2013  . Pain in joint, shoulder region 08/04/2013  . Rotator cuff tear arthropathy of right shoulder 07/21/2013  . Left rotator cuff tear arthropathy 07/21/2013  . Shortness of breath 07/07/2013  . 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 Hyperlipidemia with target LDL less than 100 10/08/2012  . Prediabetes 10/08/2012  . Expressive aphasia 10/08/2012  . Hemiplegia affecting right dominant side ([place]) 10/08/2012  . 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 Hx of bladder cancer 10/07/2012  . 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 Hypogammaglobulinemia ([place])  09/28/2012  . ASTHMA, UNSPECIFIED 03/22/2010  . Nonspecific (abnormal) findings on radiological and other examination of body structure 03/22/2010  . Hx of lymphoma 03/21/2010  . Multiple myeloma in remission ([place]) 03/21/2010  . Anxiety state 03/21/2010  . Essential hypertension 03/21/2010  . MYOCARDIAL INFARCTION 03/21/2010  . Coronary atherosclerosis  03/21/2010  . NEPHROLITHIASIS 03/21/2010  . ELEVATED PROSTATE SPECIFIC ANTIGEN 03/21/2010  . ROTATOR CUFF REPAIR, RIGHT, HX OF 03/21/2010    Guadelupe Sabin, OTR/L  [phone]  11/08/2015, 4:10 PM  [place], [place], [address] Phone: [phone]   Fax:  [phone]  Name: DMONTE MAHER MRN: [id number] Date of Birth: April 15, 1941

## 2015-11-10 ENCOUNTER — Encounter (HOSPITAL_COMMUNITY): Payer: Self-pay | Admitting: Occupational Therapy

## 2015-11-10 ENCOUNTER — Other Ambulatory Visit (HOSPITAL_COMMUNITY): Payer: Self-pay | Admitting: Oncology

## 2015-11-10 ENCOUNTER — Ambulatory Visit (HOSPITAL_COMMUNITY): Payer: Medicare Other | Admitting: Occupational Therapy

## 2015-11-10 DIAGNOSIS — M6289 Other specified disorders of muscle: Secondary | ICD-10-CM

## 2015-11-10 DIAGNOSIS — C9001 Multiple myeloma in remission: Secondary | ICD-10-CM

## 2015-11-10 DIAGNOSIS — M25512 Pain in left shoulder: Secondary | ICD-10-CM | POA: Diagnosis not present

## 2015-11-10 DIAGNOSIS — M629 Disorder of muscle, unspecified: Secondary | ICD-10-CM

## 2015-11-10 DIAGNOSIS — M6281 Muscle weakness (generalized): Secondary | ICD-10-CM

## 2015-11-10 DIAGNOSIS — M25611 Stiffness of right shoulder, not elsewhere classified: Secondary | ICD-10-CM | POA: Diagnosis not present

## 2015-11-10 DIAGNOSIS — M25511 Pain in right shoulder: Secondary | ICD-10-CM

## 2015-11-10 DIAGNOSIS — M25612 Stiffness of left shoulder, not elsewhere classified: Secondary | ICD-10-CM | POA: Diagnosis not present

## 2015-11-10 MED ORDER — LENALIDOMIDE 10 MG PO CAPS: 10.0000 mg | ORAL_CAPSULE | Freq: Every day | ORAL | Status: DC

## 2015-11-10 NOTE — Therapy (Signed)
[facility] [address] [place], [place], [address] Phone: [phone]   Fax:  [phone]  Occupational Therapy Treatment  Patient Details  Name: Jeremy Parrish MRN: [id number] Date of Birth: 09-12-1941 Referring Provider: Dr. Galen Manila  Encounter Date: 11/10/2015      OT End of Session - 11/10/15 1452    Visit Number 11   Number of Visits 12   Date for OT Re-Evaluation 11/20/15   Authorization Type Medicare   Authorization Time Period Before 20th visit   Authorization - Visit Number 11   Authorization - Number of Visits 10   OT Start Time 1346   OT Stop Time 1430   OT Time Calculation (min) 44 min   Activity Tolerance Patient tolerated treatment well   Behavior During Therapy [facility] for tasks assessed/performed      Past Medical History  Diagnosis Date  . Hypertension   . Heart disease   . Kidney stones     history  . Lung mass   . Heart murmur   . 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 Coronary artery disease   . Depression   . Shortness of breath   . Peripheral arterial disease (HCC)   . Bladder neck contracture   . Personal history of other diseases of circulatory system   . Aortic aneurysm of unspecified site without mention of rupture   . Arthritis   . Intestinovesical fistula   . Esophageal reflux   . Hyperlipidemia   . Anemia   . CHF (congestive heart failure) (HCC)   . COPD (chronic obstructive pulmonary disease) (HCC)   . Cerebral atherosclerosis     Carotid Doppler, 02/16/2013 - Bilateral Proximal ICAs,demonstrate mild plaque w/o evidence of significant diameter reduction, dissection, or any other vascular abnormality  . Complication of anesthesia   . PONV (postoperative nausea and vomiting)   . Stroke [facility]) 2013    Speech.  . Hx of bladder cancer 10/07/2012  . Sleep apnea     05-02-14 cpap , not yet used- suggested settings 5  .  Cancer (HCC)   . Prostate cancer (HCC) 2000  . Multiple myeloma   . Non Hodgkin's lymphoma (HCC)   . Myocardial infarction [facility])     '96    Past Surgical History  Procedure Laterality Date  . Prostate surgery    . Heart stents x 5  1999  . Portacath placement  07/26/2009    right chest  . Wrist surgery      right  . Left ear skin cancer removed    . Bone marrow transplant  2011  . Pacemaker insertion  07/22/2011    Medtronic  . Coronary angioplasty  06/24/2000    PCI and stenting in mid & proximal RCA  . Insert / replace / remove pacemaker    . Tee without cardioversion  10/13/2012    Procedure: TRANSESOPHAGEAL ECHOCARDIOGRAM (TEE);  Surgeon: Thurmon Fair, MD;  Location: [facility] ENDOSCOPY;  Service: Cardiovascular;  Laterality: N/A;  pat/kay/echo notified  . Colonoscopy N/A 01/01/2013    Procedure: COLONOSCOPY;  Surgeon: Malissa Hippo, MD;  Location: [facility] ENDO SUITE;  Service: Endoscopy;  Laterality: N/A;  825-moved to 940 Ann notified pt  . Rotator    . Rotator cuff repair Right   . Colon surgery      colon resection  . Bladder surgery    . Shoulder arthroscopy with subacromial decompression  Right 07/21/2013    Procedure: RIGHT SHOULDER ARTHROSCOPY WITH SUBACROMIAL DECOMPRESSION AND DEBRIDEMENT & Injection of Left Shoulder;  Surgeon: Harvie Junior, MD;  Location: [facility] OR;  Service: Orthopedics;  Laterality: Right;  . US echocardiography  06/19/2011    RV mildly dilated,mild to mod. MR,mild AI,mild PI  . Nm myocar perf wall motion  11/27/2007    inferior scar  . Inguinal hernia repair Right 05/04/2014    Procedure: OPEN RIGHT INGUINAL HERNIA REPAIR with mesh;  Surgeon: Mariella Saa, MD;  Location: [facility] ORS;  Service: General;  Laterality: Right;    There were no vitals filed for this visit.  Visit Diagnosis:  Muscle weakness  Stiffness of shoulder joint, left  Pain of both shoulder joints  Tight fascia  Stiffness of shoulder joint, right      Subjective Assessment -  11/10/15 1346    Subjective  S: My shoulders are ok today.    Currently in Pain? Yes   Pain Score 2    Pain Location Shoulder   Pain Orientation Right;Left   Pain Descriptors / Indicators Aching   Pain Type Chronic pain            [facility] OT Assessment - 11/10/15 1344    Assessment   Diagnosis Hx of CVA-shoulder pain   Precautions   Precautions None                  OT Treatments/Exercises (OP) - 11/10/15 1349    Exercises   Exercises Shoulder   Shoulder Exercises: Supine   Protraction PROM;5 reps   Horizontal ABduction PROM;5 reps   External Rotation PROM;5 reps   Internal Rotation PROM;5 reps   Flexion PROM;5 reps   ABduction PROM;5 reps   Shoulder Exercises: Seated   Protraction AAROM;15 reps   Horizontal ABduction AAROM;15 reps   External Rotation AROM;15 reps   Internal Rotation AROM;15 reps   Flexion AAROM;15 reps   Abduction AAROM;15 reps   Shoulder Exercises: Standing   Extension Theraband;15 reps   Theraband Level (Shoulder Extension) Level 3 (Green)   Row [employer] reps   Theraband Level (Shoulder Row) Level 3 (Green)   Retraction Theraband;15 reps   Theraband Level (Shoulder Retraction) Level 3 (Green)   Shoulder Exercises: Therapy Ball   Right/Left 5 reps   Right/Left Limitations each way   Shoulder Exercises: ROM/Strengthening   Wall Wash 1' each arm with 1# wrist weight   Over Head Lace 1' each arm   Manual Therapy   Manual Therapy Myofascial release   Manual therapy comments Manual therapy completed prior to therapeutic exercises   Myofascial Release Myofascial release to bilateral upper arms, trapezius, and scapularis regions to decrease pain and fascial restrictions, increase joint mobility.                   OT Short Term Goals - 10/19/15 1218    OT SHORT TERM GOAL #1   Title Pt will be educated on and independent in HEP.    Time 6   Period Weeks   Status On-going   OT SHORT TERM GOAL #2   Title Pt will decrease  fascial restrictions from mod to min amounts in BUE to decrease pain and increase range of motion.   Time 6   Period Weeks   Status On-going   OT SHORT TERM GOAL #3   Title Pt will decrease pain to 3/10 or less in BUE to increase ability to use during daily tasks.  Time 6   Period Weeks   Status On-going   OT SHORT TERM GOAL #4   Title Pt will increase A/ROM to [facility] to increase ability to reach into high cabinets during daily tasks.    Time 6   Period Weeks   Status On-going   OT SHORT TERM GOAL #5   Title Pt will increase strength to 4+/5 to increase ability to complete gardening and household chores using BUE.    Time 6   Period Weeks   Status On-going                  Plan - 11/10/15 1452    Clinical Impression Statement A: Added 1# wrist weight to wall wash, added overhead lacing and therapy ball circles. Pt required cuing to slow down during exercises, was able to complete with decreased verbal cuing for form. Pt required rest breaks throughout session due to fatigue.    Plan P: Reassess & determine if ready to discharge or needs to continue therapy.         Problem List Patient Active Problem List   Diagnosis Date Noted  . Hypokalemia 11/03/2014  . URI (upper respiratory infection) 11/03/2014  . Weakness 11/03/2014  . Obstructive sleep apnea 08/07/2014  . Central sleep apnea 08/07/2014  . Inguinal hernia 05/04/2014  . Chest pain with moderate risk for cardiac etiology 11/25/2013  . Fever 09/13/2013  . Pneumonia 09/13/2013  . Pancytopenia ([place]) 09/13/2013  . Muscle weakness (generalized) 08/04/2013  . Status post arthroscopy of shoulder 08/04/2013  . Pain in joint, shoulder region 08/04/2013  . Rotator cuff tear arthropathy of right shoulder 07/21/2013  . Left rotator cuff tear arthropathy 07/21/2013  . Shortness of breath 07/07/2013  . 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 Hyperlipidemia with target LDL less than 100 10/08/2012  . Prediabetes  10/08/2012  . Expressive aphasia 10/08/2012  . Hemiplegia affecting right dominant side ([place]) 10/08/2012  . 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 Hx of bladder cancer 10/07/2012  . 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 Hypogammaglobulinemia ([place]) 09/28/2012  . ASTHMA, UNSPECIFIED 03/22/2010  . Nonspecific (abnormal) findings on radiological and other examination of body structure 03/22/2010  . Hx of lymphoma 03/21/2010  . Multiple myeloma in remission ([place]) 03/21/2010  . Anxiety state 03/21/2010  . Essential hypertension 03/21/2010  . MYOCARDIAL INFARCTION 03/21/2010  . Coronary atherosclerosis 03/21/2010  . NEPHROLITHIASIS 03/21/2010  . ELEVATED PROSTATE SPECIFIC ANTIGEN 03/21/2010  . ROTATOR CUFF REPAIR, RIGHT, HX OF 03/21/2010    Guadelupe Sabin, OTR/L  [phone]  11/10/2015, 2:56 PM  [facility] [address] [place], [place], [address] Phone: [phone]   Fax:  [phone]  Name: Jeremy Parrish MRN: [id number] Date of Birth: Feb 09, 1941

## 2015-11-13 NOTE — Assessment & Plan Note (Signed)
NED

## 2015-11-13 NOTE — Assessment & Plan Note (Addendum)
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 He has been on maintenance Revlimid 10 mg for 7 days on and 7 days off without any intolerances to date.  Continue maintenance Revlimid. He is on ASA daily.  His leukopenia is noted and may be from his chronic Revlimid use.  He was seen at [facility] in 09/22/2015 and his provider's there were pleased with his status from a MM perspective.  His last bone marrow bx was in 2011.  He is not opposed to a bone marrow aspiration and biopsy at this time, but he is a little confused as the reasoning for one in light of his November appointment at [facility].  I've prepped him for the possibility of repeat bone marrow aspiration in the near future.  His biggest complaint is continued insomnia.  He has tried Xanax, Ambien, Temazepam.  It sounds like he does not sleep well due to episodes of urination throughout the night which is not truly insomnia.  He notes an upcoming appointment with his urologist and I have asked him to follow-up with urology regarding his frequent bathroom visits throughout the night prior to pursuing medical management of insomnia.  Labs every 8 weeks: CBC diff, CMET, CRP, B2M, SPEP + IFE, and light chain assay.  Return in 8 weeks for follow-up.

## 2015-11-13 NOTE — Assessment & Plan Note (Signed)
Receiving IVIG every 4 weeks without any recent documented infections.  Tolerating well.   IVIG today and repeated every 4 weeks. 

## 2015-11-13 NOTE — Progress Notes (Signed)
[name], MD [name] [address]  Multiple myeloma in remission [facility]) - Plan: Kappa/lambda light chains, IgG, IgA, IgM, Immunofixation electrophoresis, Protein electrophoresis, serum  Hypogammaglobulinemia (HCC)  Hx of lymphoma  CURRENT THERAPY: Revlimid 10 mg daily for 7 days on and 7 days off and monthly low-dose IVIG.  INTERVAL HISTORY: Jeremy Parrish 75 y.o. 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 Marcell Anger. 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 In 2011 he was diagnosed with IgG lambda multiple myeloma and treated with 4 cycles of Velcade and dexamethasone followed by autologous peripheral blood stem cell transplant and currently remains on maintenance Revlimid and also monthly intravenous IgG. Zometa 4 mg intravenously monthly was discontinued with last treatment on 08/18/2014.  AND Hypogammaglobulinemia with history of frequent and recurrent infections requiring antibiotics and high dose IVIG until monthly low-dose IVIG was instituted with an excellent response with minimal antibiotic requirements.  I personally reviewed and went over laboratory results with the patient.  The results are noted within this dictation.  He has a persistent pancytopenia, with a stable anemia and thrombocytopenia.  His WBC continues to decline slowly.  He continues of Revlimid.  I did review the patient's lab work with Dr. Whitney Muse and she recommended a bone marrow aspiration and biopsy.  I discussed this with the patient.  His last bone marrow bx was in 2011 at [facility].  He was seen by Dr. Debbrah Alar at [facility] on 09/22/2015 and I have reviewed the  documentation via [place].  Jeremy Parrish reports that his appointment went very well and they were pleased with his lab work.  Recent bronchitis treated by Dr. Wolfgang Phoenix with Rocephin and Prednisone taper.  This was followed by a 10 day course of Levaquin.  Today, he is due for IVIG.  He complains of insomnia.  He has tried Xanax, Ambien, and Restoril without benefit.  On further questioning, it sounds like he is up multiple times during the night to urinate.  Therefore, his issue with sleeping may be from BPH.  I have reviewed his medications and I do not see that he is on a medication for this.  Jeremy Parrish notes that he has an upcoming appointment with urology.    Past Medical History  Diagnosis Date  . Hypertension   . Heart disease   . Kidney stones     history  . Lung mass   . Heart murmur   . Hypogammaglobulinemia ([place]) 09/28/2012    Secondary to Lymphoma and Multiple Myeloma and their treatments  . Coronary artery disease   . Depression   . Shortness of breath   . Peripheral arterial disease ([place])   . Bladder neck contracture   . Personal history of other diseases of circulatory system   . Aortic aneurysm of unspecified site without mention of rupture   . Arthritis   . Intestinovesical fistula   . Esophageal reflux   . Hyperlipidemia   . Anemia   . CHF (congestive heart failure) ([place])   . COPD (chronic obstructive pulmonary disease) ([place])   . Cerebral atherosclerosis     Carotid Doppler, 02/16/2013 -  Bilateral Proximal ICAs,demonstrate mild plaque w/o evidence of significant diameter reduction, dissection, or any other vascular abnormality  . Complication of anesthesia   . PONV (postoperative nausea and vomiting)   . Stroke [facility]) 2013    Speech.  . Hx of bladder cancer 10/07/2012  . Sleep apnea     05-02-14 cpap , not yet used- suggested settings 5  . Cancer ([place])   . Prostate cancer ([place]) 2000  . Multiple myeloma   . Non Hodgkin's lymphoma ([place])   . Myocardial infarction [facility])       '96  . 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; H/O cardiac pacemaker, Medtronic REVO, MRI conditional device, placed 07/2011 for sympyomatic bradycardia; Hx of bladder cancer; Hyperlipidemia with target LDL less than 100; Prediabetes; Expressive aphasia; Hemiplegia affecting right dominant side ([place]); Aneurysm of iliac artery ([place]); Shortness of breath; Rotator cuff tear arthropathy of right shoulder; Left rotator cuff tear arthropathy; Muscle weakness (generalized); Status post arthroscopy of shoulder; Pain in joint, shoulder region; Fever; Pneumonia; Pancytopenia ([place]); Chest pain with moderate risk for cardiac etiology; Inguinal hernia; Obstructive sleep apnea; Central sleep apnea; Hypokalemia; URI (upper respiratory infection); and Weakness on his problem list.     is allergic to diphenhydramine hcl; morphine and related; and tape.  Current Outpatient Prescriptions on File Prior to Visit  Medication Sig Dispense Refill  . ALPRAZolam (XANAX) 0.5 MG tablet Take 1-2 tablets (0.5-1 mg total) by mouth at bedtime as needed for anxiety. 90 tablet 1  . amLODipine (NORVASC) 10 MG tablet TAKE 1 TABLET (10 MG TOTAL) BY MOUTH EVERY MORNING. 90 tablet 0  . aspirin EC 81 MG tablet Take 81 mg by mouth at bedtime.    . benzonatate (TESSALON PERLES) 100 MG capsule Take 1 capsule (100 mg total) by mouth 3 (three) times daily as needed for cough. 30 capsule 0  . citalopram (CELEXA) 40 MG tablet Take 1 tablet (40 mg total) by mouth daily. 90 tablet 3  . clopidogrel (PLAVIX) 75 MG tablet TAKE 1 TABLET BY MOUTH EVERY DAY 90 tablet 3  .  fluticasone (FLONASE) 50 MCG/ACT nasal spray Place 2 sprays into both nostrils daily. 16 g 6  . furosemide (LASIX) 20 MG tablet Use sparingly prn edema no more than one per week 21 tablet 0  . HYDROcodone-acetaminophen (NORCO/VICODIN) 5-325 MG tablet Take 1 tablet by mouth 2 (two) times daily as needed. 60 tablet 0  . KLOR-CON M20 20 MEQ tablet Take 20 mEq by mouth 2 (two) times daily. Reported on 11/15/2015    . lenalidomide (REVLIMID) 10 MG capsule Take 1 capsule (10 mg total) by mouth daily. Take 1 capsule by mouth daily for 7 days on followed by 7 days off 14 capsule 0  . nitroGLYCERIN (NITROSTAT) 0.4 MG SL tablet Place 1 tablet (0.4 mg total) under the tongue every 5 (five) minutes as needed for chest pain. 25 tablet 0  . omeprazole (PRILOSEC) 20 MG capsule TAKE 1 CAPSULE (20 MG TOTAL) BY MOUTH DAILY. 90 capsule 1  . Potassium Chloride ER 20 MEQ TBCR Take 20 mEq by mouth 2 (two) times daily. 60 tablet 2  . simvastatin (ZOCOR) 20 MG tablet Take 1 tablet (20 mg total)  by mouth at bedtime. 90 tablet 3  . albuterol (PROVENTIL HFA;VENTOLIN HFA) 108 (90 BASE) MCG/ACT inhaler Inhale 2 puffs into the lungs every 6 (six) hours as needed for wheezing or shortness of breath. (Patient not taking: Reported on 11/15/2015) 1 Inhaler 2  . celecoxib (CELEBREX) 100 MG capsule Take 1 capsule (100 mg total) by mouth 2 (two) times daily. (Patient not taking: Reported on 11/15/2015) 60 capsule 2  . fexofenadine (ALLEGRA ALLERGY) 180 MG tablet Take 1 tablet (180 mg total) by mouth daily. (Patient not taking: Reported on 11/15/2015) 30 tablet 5   Current Facility-Administered Medications on File Prior to Visit  Medication Dose Route Frequency Provider Last Rate Last Dose  . acetaminophen (TYLENOL) tablet 650 mg  650 mg Oral Q6H PRN Baird Cancer, PA-C   650 mg at 12/09/14 0855  . acetaminophen (TYLENOL) tablet 650 mg  650 mg Oral Q6H PRN Patrici Ranks, MD   650 mg at 11/15/15 1050  . sodium chloride 0.9 % injection 10  mL  10 mL Intracatheter PRN Baird Cancer, PA-C   10 mL at 12/09/14 0856  . sodium chloride 0.9 % injection 10 mL  10 mL Intracatheter PRN Patrici Ranks, MD   10 mL at 11/15/15 1051    Past Surgical History  Procedure Laterality Date  . Prostate surgery    . Heart stents x 5  1999  . Portacath placement  07/26/2009    right chest  . Wrist surgery      right  . Left ear skin cancer removed    . Bone marrow transplant  2011  . Pacemaker insertion  07/22/2011    Medtronic  . Coronary angioplasty  06/24/2000    PCI and stenting in mid & proximal RCA  . Insert / replace / remove pacemaker    . Tee without cardioversion  10/13/2012    Procedure: TRANSESOPHAGEAL ECHOCARDIOGRAM (TEE);  Surgeon: Sanda Klein, MD;  Location: [facility] ENDOSCOPY;  Service: Cardiovascular;  Laterality: N/A;  pat/kay/echo notified  . Colonoscopy N/A 01/01/2013    Procedure: COLONOSCOPY;  Surgeon: Rogene Houston, MD;  Location: [facility] ENDO SUITE;  Service: Endoscopy;  Laterality: N/A;  825-moved to [place] notified pt  . Rotator    . Rotator cuff repair Right   . Colon surgery      colon resection  . Bladder surgery    . Shoulder arthroscopy with subacromial decompression Right 07/21/2013    Procedure: RIGHT SHOULDER ARTHROSCOPY WITH SUBACROMIAL DECOMPRESSION AND DEBRIDEMENT & Injection of Left Shoulder;  Surgeon: Alta Corning, MD;  Location: [place];  Service: Orthopedics;  Laterality: Right;  . US echocardiography  06/19/2011    RV mildly dilated,mild to mod. MR,mild AI,mild PI  . Nm myocar perf wall motion  11/27/2007    inferior scar  . Inguinal hernia repair Right 05/04/2014    Procedure: OPEN RIGHT INGUINAL HERNIA REPAIR with mesh;  Surgeon: Edward Jolly, MD;  Location: [facility] ORS;  Service: General;  Laterality: Right;    Denies any headaches, dizziness, double vision, fevers, chills, night sweats, nausea, vomiting, diarrhea, constipation, chest pain, heart palpitations, shortness of breath, blood in stool,  black tarry stool, urinary pain, urinary burning, urinary frequency, hematuria.   PHYSICAL EXAMINATION  ECOG PERFORMANCE STATUS: 1 - Symptomatic but completely ambulatory  Filed Vitals:   11/15/15 1156 11/15/15 1327  BP: 121/67 126/73  Pulse: 57 62  Temp: 98.1 F (36.7 C) 97.9 F (36.6 C)  Resp: 17  40    GENERAL:alert, no distress, well nourished, well developed, comfortable, cooperative, smiling, unaccompanied SKIN: skin color, texture, turgor are normal, no rashes or significant lesions HEAD: Normocephalic, No masses, lesions, tenderness or abnormalities EYES: normal, PERRLA, EOMI, Conjunctiva are pink and non-injected EARS: External ears normal OROPHARYNX:lips, buccal mucosa, and tongue normal and mucous membranes are moist  NECK: supple, thyroid normal size, non-tender, without nodularity, trachea midline LYMPH:  no palpable lymphadenopathy BREAST:not examined LUNGS: clear to auscultation and percussion HEART: regular rate & rhythm, no murmurs, no gallops, S1 normal and S2 normal ABDOMEN:abdomen soft, non-tender and normal bowel sounds BACK: Back symmetric, no curvature., No CVA tenderness EXTREMITIES:less then 2 second capillary refill, no joint deformities, effusion, or inflammation, no skin discoloration, no cyanosis  NEURO: alert & oriented x 3 with expressive aphasia (Broca's aphasia), no focal motor/sensory deficits, gait normal   LABORATORY DATA: CBC    Component Value Date/Time   WBC 3.0* 11/14/2015 1114   RBC 4.33 11/14/2015 1114   HGB 12.7* 11/14/2015 1114   HCT 38.7* 11/14/2015 1114   PLT 141* 11/14/2015 1114   MCV 89.4 11/14/2015 1114   MCH 29.3 11/14/2015 1114   MCHC 32.8 11/14/2015 1114   RDW 15.6* 11/14/2015 1114   LYMPHSABS 1.0 11/14/2015 1114   MONOABS 0.3 11/14/2015 1114   EOSABS 0.2 11/14/2015 1114   BASOSABS 0.0 11/14/2015 1114      Chemistry      Component Value Date/Time   NA 139 11/14/2015 1114   K 4.0 11/14/2015 1114   CL 107  11/14/2015 1114   CO2 25 11/14/2015 1114   BUN 12 11/14/2015 1114   CREATININE 0.94 11/14/2015 1114   CREATININE 0.80 11/07/2014 1034      Component Value Date/Time   CALCIUM 9.3 11/14/2015 1114   ALKPHOS 65 11/14/2015 1114   AST 23 11/14/2015 1114   ALT 22 11/14/2015 1114   BILITOT 0.6 11/14/2015 1114      Lab Results  Component Value Date   PROT 6.9 11/14/2015   ALBUMINELP 3.4 11/14/2015   A1GS 0.2 11/14/2015   A2GS 0.7 11/14/2015   BETS 1.0 11/14/2015   BETA2SER 5.5 01/06/2015   GAMS 0.8 11/14/2015   MSPIKE Not Observed 11/14/2015   SPEI Comment 11/14/2015   SPECOM Comment 11/14/2015   IGGSERUM 731 11/14/2015   IGA 245 11/14/2015   IGMSERUM 24 11/14/2015   IMMELINT (NOTE) 01/06/2015   KPAFRELGTCHN 21.38* 09/14/2015   LAMBDASER 20.40 09/14/2015   KAPLAMBRATIO 1.05 09/14/2015    PENDING LABS:   RADIOGRAPHIC STUDIES:  No results found.   PATHOLOGY:    ASSESSMENT AND PLAN:  Multiple myeloma in remission Pleasant 75 year old male with a history of IgG lambda multiple myeloma status post stem cell transplant in October 2011. He has been on maintenance Revlimid 10 mg for 7 days on and 7 days off without any intolerances to date.  Continue maintenance Revlimid. He is on ASA daily.  His leukopenia is noted and may be from his chronic Revlimid use.  He was seen at [facility] in 09/22/2015 and his provider's there were pleased with his status from a MM perspective.  His last bone marrow bx was in 2011.  He is not opposed to a bone marrow aspiration and biopsy at this time, but he is a little confused as the reasoning for one in light of his November appointment at [facility].  I've prepped him for the possibility of repeat bone marrow aspiration in the near future.  His biggest  complaint is continued insomnia.  He has tried Xanax, Ambien, Temazepam.  It sounds like he does not sleep well due to episodes of urination throughout the night which is not truly insomnia.  He notes an  upcoming appointment with his urologist and I have asked him to follow-up with urology regarding his frequent bathroom visits throughout the night prior to pursuing medical management of insomnia.  Labs every 8 weeks: CBC diff, CMET, CRP, B2M, SPEP + IFE, and light chain assay.  Return in 8 weeks for follow-up.  Hypogammaglobulinemia Receiving IVIG every 4 weeks without any recent documented infections.  Tolerating well.   IVIG today and repeated every 4 weeks.  Hx of lymphoma NED    THERAPY PLAN:  Continue with current treatment.  Continue monthly IVIG.  Continue Revlimid 7 days on and 7 days off.  We will monitor WBC.  He may need a bone marrow aspiration and biopsy in the Spring 2017.  All questions were answered. The patient knows to call the clinic with any problems, questions or concerns. We can certainly see the patient much sooner if necessary.  Patient and plan discussed with Dr. Ancil Linsey and she is in agreement with the aforementioned.   This note is electronically signed by: Robynn Pane, PA-C 11/15/2015 4:20 PM

## 2015-11-14 ENCOUNTER — Ambulatory Visit (HOSPITAL_COMMUNITY): Payer: Medicare Other | Attending: Hematology & Oncology

## 2015-11-14 ENCOUNTER — Telehealth (HOSPITAL_COMMUNITY): Payer: Self-pay

## 2015-11-14 ENCOUNTER — Encounter (HOSPITAL_COMMUNITY): Payer: Medicare Other | Attending: Oncology

## 2015-11-14 DIAGNOSIS — M629 Disorder of muscle, unspecified: Secondary | ICD-10-CM | POA: Insufficient documentation

## 2015-11-14 DIAGNOSIS — M6281 Muscle weakness (generalized): Secondary | ICD-10-CM | POA: Insufficient documentation

## 2015-11-14 DIAGNOSIS — M25611 Stiffness of right shoulder, not elsewhere classified: Secondary | ICD-10-CM | POA: Insufficient documentation

## 2015-11-14 DIAGNOSIS — C9001 Multiple myeloma in remission: Secondary | ICD-10-CM | POA: Insufficient documentation

## 2015-11-14 DIAGNOSIS — Z8572 Personal history of non-Hodgkin lymphomas: Secondary | ICD-10-CM

## 2015-11-14 DIAGNOSIS — M25612 Stiffness of left shoulder, not elsewhere classified: Secondary | ICD-10-CM | POA: Insufficient documentation

## 2015-11-14 DIAGNOSIS — C9000 Multiple myeloma not having achieved remission: Secondary | ICD-10-CM | POA: Insufficient documentation

## 2015-11-14 LAB — COMPREHENSIVE METABOLIC PANEL
ALBUMIN: 3.8 g/dL (ref 3.5–5.0)
ALK PHOS: 65 U/L (ref 38–126)
ALT: 22 U/L (ref 17–63)
AST: 23 U/L (ref 15–41)
Anion gap: 7 (ref 5–15)
BUN: 12 mg/dL (ref 6–20)
CALCIUM: 9.3 mg/dL (ref 8.9–10.3)
CO2: 25 mmol/L (ref 22–32)
CREATININE: 0.94 mg/dL (ref 0.61–1.24)
Chloride: 107 mmol/L (ref 101–111)
GFR calc non Af Amer: >60 mL/min (ref >60)
GLUCOSE: 78 mg/dL (ref 65–99)
Potassium: 4 mmol/L (ref 3.5–5.1)
SODIUM: 139 mmol/L (ref 135–145)
TOTAL PROTEIN: 6.9 g/dL (ref 6.5–8.1)
Total Bilirubin: 0.6 mg/dL (ref 0.3–1.2)

## 2015-11-14 LAB — CBC WITH DIFFERENTIAL/PLATELET
BASOS PCT: 1 %
Basophils Absolute: 0 10*3/uL (ref 0.0–0.1)
Eosinophils Absolute: 0.2 10*3/uL (ref 0.0–0.7)
Eosinophils Relative: 6 %
HEMATOCRIT: 38.7 % — AB (ref 39.0–52.0)
Hemoglobin: 12.7 g/dL — ABNORMAL LOW (ref 13.0–17.0)
LYMPHS PCT: 32 %
Lymphs Abs: 1 10*3/uL (ref 0.7–4.0)
MCH: 29.3 pg (ref 26.0–34.0)
MCHC: 32.8 g/dL (ref 30.0–36.0)
MCV: 89.4 fL (ref 78.0–100.0)
MONO ABS: 0.3 10*3/uL (ref 0.1–1.0)
MONOS PCT: 9 %
NEUTROS ABS: 1.6 10*3/uL — AB (ref 1.7–7.7)
Neutrophils Relative %: 52 %
PLATELETS: 141 10*3/uL — AB (ref 150–400)
RBC: 4.33 MIL/uL (ref 4.22–5.81)
RDW: 15.6 % — ABNORMAL HIGH (ref 11.5–15.5)
WBC: 3 10*3/uL — ABNORMAL LOW (ref 4.0–10.5)

## 2015-11-14 LAB — LACTATE DEHYDROGENASE: LDH: 163 U/L (ref 98–192)

## 2015-11-14 LAB — SEDIMENTATION RATE: Sed Rate: 10 mm/hr (ref 0–16)

## 2015-11-14 LAB — C-REACTIVE PROTEIN

## 2015-11-14 NOTE — Telephone Encounter (Signed)
DATE: 11/14/15  Spoke with male patron that answered phone and left message about missed appointment. Reminder given of next scheduled appointment.   Ailene Ravel, OTR/L,CBIS  [phone]

## 2015-11-15 ENCOUNTER — Encounter (HOSPITAL_COMMUNITY): Payer: Medicare Other

## 2015-11-15 ENCOUNTER — Ambulatory Visit (HOSPITAL_COMMUNITY): Payer: Medicare Other | Admitting: Hematology & Oncology

## 2015-11-15 ENCOUNTER — Encounter (HOSPITAL_BASED_OUTPATIENT_CLINIC_OR_DEPARTMENT_OTHER): Payer: Medicare Other | Admitting: Oncology

## 2015-11-15 ENCOUNTER — Encounter (HOSPITAL_COMMUNITY): Payer: Self-pay | Admitting: Oncology

## 2015-11-15 ENCOUNTER — Telehealth (HOSPITAL_COMMUNITY): Payer: Self-pay

## 2015-11-15 VITALS — BP 126/73 | HR 62 | Temp 97.9°F | Resp 17 | Wt 187.2 lb

## 2015-11-15 VITALS — BP 131/66 | HR 61 | Temp 97.8°F | Resp 17

## 2015-11-15 DIAGNOSIS — C9001 Multiple myeloma in remission: Secondary | ICD-10-CM | POA: Diagnosis not present

## 2015-11-15 DIAGNOSIS — D649 Anemia, unspecified: Secondary | ICD-10-CM | POA: Diagnosis not present

## 2015-11-15 DIAGNOSIS — D801 Nonfamilial hypogammaglobulinemia: Secondary | ICD-10-CM

## 2015-11-15 DIAGNOSIS — Z8572 Personal history of non-Hodgkin lymphomas: Secondary | ICD-10-CM

## 2015-11-15 DIAGNOSIS — D696 Thrombocytopenia, unspecified: Secondary | ICD-10-CM | POA: Diagnosis not present

## 2015-11-15 DIAGNOSIS — G4700 Insomnia, unspecified: Secondary | ICD-10-CM | POA: Diagnosis not present

## 2015-11-15 DIAGNOSIS — D61818 Other pancytopenia: Secondary | ICD-10-CM | POA: Diagnosis not present

## 2015-11-15 DIAGNOSIS — D72819 Decreased white blood cell count, unspecified: Secondary | ICD-10-CM | POA: Diagnosis not present

## 2015-11-15 LAB — IGG, IGA, IGM
IgA: 250 mg/dL (ref 61–437)
IgG (Immunoglobin G), Serum: 752 mg/dL (ref 700–1600)
IgM, Serum: 25 mg/dL (ref 15–143)

## 2015-11-15 LAB — IMMUNOFIXATION ELECTROPHORESIS
IGA: 245 mg/dL (ref 61–437)
IGG (IMMUNOGLOBIN G), SERUM: 731 mg/dL (ref 700–1600)
IGM, SERUM: 24 mg/dL (ref 15–143)
Total Protein ELP: 6.3 g/dL (ref 6.0–8.5)

## 2015-11-15 LAB — KAPPA/LAMBDA LIGHT CHAINS
KAPPA FREE LGHT CHN: 24.99 mg/L — AB (ref 3.30–19.40)
Kappa, lambda light chain ratio: 1.1 (ref 0.26–1.65)
LAMDA FREE LIGHT CHAINS: 22.78 mg/L (ref 5.71–26.30)

## 2015-11-15 LAB — PROTEIN ELECTROPHORESIS, SERUM
A/G Ratio: 1.2 (ref 0.7–1.7)
Albumin ELP: 3.4 g/dL (ref 2.9–4.4)
Alpha-1-Globulin: 0.2 g/dL (ref 0.0–0.4)
Alpha-2-Globulin: 0.7 g/dL (ref 0.4–1.0)
Beta Globulin: 1 g/dL (ref 0.7–1.3)
GAMMA GLOBULIN: 0.8 g/dL (ref 0.4–1.8)
GLOBULIN, TOTAL: 2.8 g/dL (ref 2.2–3.9)
TOTAL PROTEIN ELP: 6.2 g/dL (ref 6.0–8.5)

## 2015-11-15 LAB — BETA 2 MICROGLOBULIN, SERUM: BETA 2 MICROGLOBULIN: 2 mg/L (ref 0.6–2.4)

## 2015-11-15 MED ORDER — SODIUM CHLORIDE 0.9 % IV SOLN
Freq: Once | INTRAVENOUS | Status: AC
  Administered 2015-11-15: 11:00:00 via INTRAVENOUS

## 2015-11-15 MED ORDER — HEPARIN SOD (PORK) LOCK FLUSH 100 UNIT/ML IV SOLN
500.0000 [IU] | Freq: Once | INTRAVENOUS | Status: AC | PRN
  Administered 2015-11-15: 500 [IU]
  Filled 2015-11-15: qty 5

## 2015-11-15 MED ORDER — IMMUNE GLOBULIN (HUMAN) 20 GM/200ML IV SOLN
35.0000 g | Freq: Once | INTRAVENOUS | Status: AC
  Administered 2015-11-15: 35 g via INTRAVENOUS
  Filled 2015-11-15: qty 200

## 2015-11-15 MED ORDER — ACETAMINOPHEN 325 MG PO TABS
650.0000 mg | ORAL_TABLET | Freq: Four times a day (QID) | ORAL | Status: DC | PRN
  Administered 2015-11-15: 650 mg via ORAL
  Filled 2015-11-15: qty 2

## 2015-11-15 MED ORDER — SODIUM CHLORIDE 0.9 % IJ SOLN
10.0000 mL | INTRAMUSCULAR | Status: DC | PRN
  Administered 2015-11-15: 10 mL
  Filled 2015-11-15: qty 10

## 2015-11-15 NOTE — Patient Instructions (Signed)
[place] at [facility] Discharge Instructions  RECOMMENDATIONS MADE BY THE CONSULTANT AND ANY TEST RESULTS WILL BE SENT TO YOUR REFERRING PHYSICIAN.  Exam and discussion by Robynn Pane, PA-C Will be in touch regarding treatment options related to inabilty to sleep and will discuss bone marrow as well. Call with fevers or other concerns Continue IVIG every weeks.  Follow-up: Labs and office visit in 8 weeks.  Thank you for choosing [place] at [facility] to provide your oncology and hematology care.  To afford each patient quality time with our provider, please arrive at least 15 minutes before your scheduled appointment time.    You need to re-schedule your appointment should you arrive 10 or more minutes late.  We strive to give you quality time with our providers, and arriving late affects you and other patients whose appointments are after yours.  Also, if you no show three or more times for appointments you may be dismissed from the clinic at the providers discretion.     Again, thank you for choosing [facility].  Our hope is that these requests will decrease the amount of time that you wait before being seen by our physicians.       _____________________________________________________________  Should you have questions after your visit to [facility], please contact our office at [phone] between the hours of 8:30 a.m. and 4:30 p.m.  Voicemails left after 4:30 p.m. will not be returned until the following business day.  For prescription refill requests, have your pharmacy contact our office.

## 2015-11-15 NOTE — Progress Notes (Signed)
Tolerated  IVIG infusion well. 

## 2015-11-15 NOTE — Telephone Encounter (Signed)
-----  Message from Baird Cancer, PA-C sent at 11/15/2015  4:22 PM EST ----- Please call patient.  We will monitor his counts.  If his WBC continues to decline, we will do a bone marrow biopsy.  We won't do one yet.  Regarding his insomnia.  He really is getting up through the night to pee.  He has an appt, he says, with his urologist.  Let's have his urination issue addressed before we start pushing forward with sleeping medication(s).  TK

## 2015-11-15 NOTE — Telephone Encounter (Signed)
Patient notified and verbalized understanding of instructions. 

## 2015-11-16 ENCOUNTER — Ambulatory Visit (HOSPITAL_COMMUNITY): Payer: Medicare Other

## 2015-11-16 ENCOUNTER — Encounter (HOSPITAL_COMMUNITY): Payer: Self-pay

## 2015-11-16 DIAGNOSIS — M629 Disorder of muscle, unspecified: Secondary | ICD-10-CM

## 2015-11-16 DIAGNOSIS — M25611 Stiffness of right shoulder, not elsewhere classified: Secondary | ICD-10-CM | POA: Diagnosis not present

## 2015-11-16 DIAGNOSIS — M6289 Other specified disorders of muscle: Secondary | ICD-10-CM

## 2015-11-16 DIAGNOSIS — M6281 Muscle weakness (generalized): Secondary | ICD-10-CM | POA: Diagnosis not present

## 2015-11-16 DIAGNOSIS — M25612 Stiffness of left shoulder, not elsewhere classified: Secondary | ICD-10-CM

## 2015-11-16 NOTE — Therapy (Signed)
[facility] [place], [place], [address] Phone: [phone]   Fax:  [phone]  Occupational Therapy Treatment  Patient Details  Name: Jeremy Parrish MRN: [id number] Date of Birth: April 26, 1941 Referring Provider: Dr. Whitney Muse  Encounter Date: 11/16/2015      OT End of Session - 11/16/15 1217    Visit Number 12   Number of Visits 12   Authorization Type Medicare   Authorization Time Period Before 20th visit   Authorization - Visit Number 12   Authorization - Number of Visits 20   OT Start Time 0930   OT Stop Time 1025   OT Time Calculation (min) 55 min   Activity Tolerance Patient tolerated treatment well   Behavior During Therapy [facility] for tasks assessed/performed      Past Medical History  Diagnosis Date  . Hypertension   . Heart disease   . Kidney stones     history  . Lung mass   . Heart murmur   . 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 Coronary artery disease   . Depression   . Shortness of breath   . Peripheral arterial disease ([place])   . Bladder neck contracture   . Personal history of other diseases of circulatory system   . Aortic aneurysm of unspecified site without mention of rupture   . Arthritis   . Intestinovesical fistula   . Esophageal reflux   . Hyperlipidemia   . Anemia   . CHF (congestive heart failure) ([place])   . COPD (chronic obstructive pulmonary disease) ([place])   . Cerebral atherosclerosis     Carotid Doppler, 02/16/2013 - Bilateral Proximal ICAs,demonstrate mild plaque w/o evidence of significant diameter reduction, dissection, or any other vascular abnormality  . Complication of anesthesia   . PONV (postoperative nausea and vomiting)   . Stroke [facility]) 2013    Speech.  . Hx of bladder cancer 10/07/2012  . Sleep apnea     05-02-14 cpap , not yet used- suggested settings 5  . Cancer ([place])   . Prostate cancer ([place]) 2000   . Multiple myeloma   . Non Hodgkin's lymphoma ([place])   . Myocardial infarction [facility])     '96  . Shingles     Past Surgical History  Procedure Laterality Date  . Prostate surgery    . Heart stents x 5  1999  . Portacath placement  07/26/2009    right chest  . Wrist surgery      right  . Left ear skin cancer removed    . Bone marrow transplant  2011  . Pacemaker insertion  07/22/2011    Medtronic  . Coronary angioplasty  06/24/2000    PCI and stenting in mid & proximal RCA  . Insert / replace / remove pacemaker    . Tee without cardioversion  10/13/2012    Procedure: TRANSESOPHAGEAL ECHOCARDIOGRAM (TEE);  Surgeon: Sanda Klein, MD;  Location: [facility] ENDOSCOPY;  Service: Cardiovascular;  Laterality: N/A;  pat/kay/echo notified  . Colonoscopy N/A 01/01/2013    Procedure: COLONOSCOPY;  Surgeon: Rogene Houston, MD;  Location: [facility] ENDO SUITE;  Service: Endoscopy;  Laterality: N/A;  825-moved to [place] notified pt  . Rotator    . Rotator cuff repair Right   . Colon surgery      colon resection  . Bladder surgery    . Shoulder arthroscopy with subacromial decompression Right 07/21/2013  Procedure: RIGHT SHOULDER ARTHROSCOPY WITH SUBACROMIAL DECOMPRESSION AND DEBRIDEMENT & Injection of Left Shoulder;  Surgeon: Alta Corning, MD;  Location: [facility];  Service: Orthopedics;  Laterality: Right;  . US echocardiography  06/19/2011    RV mildly dilated,mild to mod. MR,mild AI,mild PI  . Nm myocar perf wall motion  11/27/2007    inferior scar  . Inguinal hernia repair Right 05/04/2014    Procedure: OPEN RIGHT INGUINAL HERNIA REPAIR with mesh;  Surgeon: Edward Jolly, MD;  Location: [facility] ORS;  Service: General;  Laterality: Right;    There were no vitals filed for this visit.  Visit Diagnosis:  Muscle weakness  Stiffness of shoulder joint, left  Tight fascia  Stiffness of shoulder joint, right      Subjective Assessment - 11/16/15 1208    Subjective  S: I have a little pain in my arm but  it's nothing like it was used.    Currently in Pain? No/denies            [facility] OT Assessment - 11/16/15 0934    Assessment   Diagnosis Hx of CVA-shoulder pain   Precautions   Precautions None   AROM   Overall AROM Comments Assessed seated, er/IR adducted    AROM Assessment Site Shoulder   Right/Left Shoulder Right;Left   Right Shoulder Flexion 155 Degrees  previous: 155   Right Shoulder ABduction 165 Degrees  previous: 160   Right Shoulder Internal Rotation 90 Degrees  previous: 75   Right Shoulder External Rotation 90 Degrees  previous: 70   Left Shoulder Flexion 137 Degrees  previous: 130   Left Shoulder ABduction 122 Degrees  previous: 120   Left Shoulder Internal Rotation 90 Degrees  previous: 75   Left Shoulder External Rotation 70 Degrees  previous: 60   PROM   Overall PROM Comments Assessed supine, ER/ir adducted    PROM Assessment Site Shoulder   Right/Left Shoulder Right;Left   Right Shoulder Flexion 170 Degrees  previous: 156   Right Shoulder ABduction 180 Degrees  previous: 165   Right Shoulder Internal Rotation 90 Degrees  previous: 90   Right Shoulder External Rotation 90 Degrees  previous: 85   Left Shoulder Flexion 170 Degrees  previous: 155   Left Shoulder ABduction 180 Degrees  previous: 151   Left Shoulder Internal Rotation 90 Degrees  previous: 90   Left Shoulder External Rotation 88 Degrees  previous: 69   Strength   Overall Strength Comments Assessed seated, er/IR adducted    Strength Assessment Site Shoulder   Right/Left Shoulder Right;Left   Right Shoulder Flexion 3+/5  previous: 4-/5   Right Shoulder ABduction 3+/5  previous: 3+/5   Right Shoulder Internal Rotation 3+/5  previous: 4/5   Right Shoulder External Rotation 5/5  previous: 4-/5   Left Shoulder Flexion 3+/5  previous: 4-/5   Left Shoulder ABduction 4-/5  previous: 3+/5   Left Shoulder Internal Rotation 5/5  previous: 4/5   Left Shoulder External Rotation 3+/5   previous: 4-/5                  OT Treatments/Exercises (OP) - 11/16/15 1209    Exercises   Exercises Shoulder   Shoulder Exercises: Supine   Protraction PROM;5 reps   Horizontal ABduction PROM;5 reps   External Rotation PROM;5 reps   Internal Rotation PROM;5 reps   Flexion PROM;5 reps   ABduction PROM;5 reps   Shoulder Exercises: Standing   External Rotation Theraband;10 reps  Theraband Level (Shoulder External Rotation) Level 2 (Red)   Internal Rotation Theraband;10 reps   Theraband Level (Shoulder Internal Rotation) Level 2 (Red)   Extension Theraband;10 reps   Theraband Level (Shoulder Extension) Level 2 (Red)   Row Theraband;10 reps   Theraband Level (Shoulder Row) Level 2 (Red)   Retraction Theraband;10 reps   Theraband Level (Shoulder Retraction) Level 2 (Red)   Manual Therapy   Manual Therapy Myofascial release   Manual therapy comments Manual therapy completed prior to therapeutic exercises   Myofascial Release Myofascial release to bilateral upper arms, trapezius, and scapularis regions to decrease pain and fascial restrictions, increase joint mobility.                 OT Education - 2015-12-07 1217    Education provided Yes   Education Details Shoulder stretches and red theraband strengthening exercises   Person(s) Educated Patient   Methods Demonstration;Explanation;Handout;Verbal cues   Comprehension Returned demonstration;Verbalized understanding          OT Short Term Goals - 12/07/2015 1003    OT SHORT TERM GOAL #1   Title Pt will be educated on and independent in [place].    Time 6   Period Weeks   Status Achieved   OT SHORT TERM GOAL #2   Title Pt will decrease fascial restrictions from mod to min amounts in BUE to decrease pain and increase range of motion.   Time 6   Period Weeks   Status Achieved   OT SHORT TERM GOAL #3   Title Pt will decrease pain to 3/10 or less in BUE to increase ability to use during daily tasks.    Time 6    Period Weeks   Status Achieved   OT SHORT TERM GOAL #4   Title Pt will increase A/ROM to [facility] to increase ability to reach into high cabinets during daily tasks.    Time 6   Period Weeks   Status Achieved   OT SHORT TERM GOAL #5   Title Pt will increase strength to 4+/5 to increase ability to complete gardening and household chores using BUE.    Time 6   Period Weeks   Status Not Met                  Plan - 12-07-2015 1218    Clinical Impression Statement A: Reassessment completed this date. patient met 4/5 short term goals. Gains made in all areas with the exception of strength in which patient continues to have deficits. Pt reports that the pain he feels periodically in mainly his right arm is nothing like it used to be. patient feels comfident that he can contiue at home with a HEP.    Plan P: D/C from therapy with HEP.      OCCUPATIONAL THERAPY DISCHARGE SUMMARY  Visits from Start of Care: 12  Current functional level related to goals / functional outcomes: See above   Remaining deficits: See above   Education / Equipment: Red theraband; shoulder stretches, A/ROM exercises Plan: Patient agrees to discharge.  Patient goals were met. Patient is being discharged due to meeting the stated rehab goals.  ?????           G-Codes - 12/07/2015 1225    Functional Assessment Tool Used clinical judgement   Functional Limitation Carrying, moving and handling objects   Carrying, Moving and Handling Objects Goal Status (N4709) At least 20 percent but less than 40 percent impaired, limited or restricted  Carrying, Moving and Handling Objects Discharge Status [id number]) At least 20 percent but less than 40 percent impaired, limited or restricted      Problem List Patient Active Problem List   Diagnosis Date Noted  . Hypokalemia 11/03/2014  . URI (upper respiratory infection) 11/03/2014  . Weakness 11/03/2014  . Obstructive sleep apnea 08/07/2014  . Central sleep apnea  08/07/2014  . Inguinal hernia 05/04/2014  . Chest pain with moderate risk for cardiac etiology 11/25/2013  . Fever 09/13/2013  . Pneumonia 09/13/2013  . Pancytopenia ([place]) 09/13/2013  . Muscle weakness (generalized) 08/04/2013  . Status post arthroscopy of shoulder 08/04/2013  . Pain in joint, shoulder region 08/04/2013  . Rotator cuff tear arthropathy of right shoulder 07/21/2013  . Left rotator cuff tear arthropathy 07/21/2013  . Shortness of breath 07/07/2013  . 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 Hyperlipidemia with target LDL less than 100 10/08/2012  . Prediabetes 10/08/2012  . Expressive aphasia 10/08/2012  . Hemiplegia affecting right dominant side ([employer]) 10/08/2012  . 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 Hx of bladder cancer 10/07/2012  . 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 ASTHMA, UNSPECIFIED 03/22/2010  . Nonspecific (abnormal) findings on radiological and other examination of body structure 03/22/2010  . Hx of lymphoma 03/21/2010  . Multiple myeloma in remission ([place]) 03/21/2010  . Anxiety state 03/21/2010  . Essential hypertension 03/21/2010  . MYOCARDIAL INFARCTION 03/21/2010  . Coronary atherosclerosis 03/21/2010  . NEPHROLITHIASIS 03/21/2010  . ELEVATED PROSTATE SPECIFIC ANTIGEN 03/21/2010  . ROTATOR CUFF REPAIR, RIGHT, HX OF 03/21/2010    Ailene Ravel, OTR/L,CBIS  [phone]  11/16/2015, 12:28 PM  [facility] [address] [place], [place], [address] Phone: [phone]   Fax:  [phone]  Name: Jeremy Parrish MRN: [id number] Date of Birth: 04/11/1941

## 2015-11-16 NOTE — Progress Notes (Signed)
LABS DRAWN

## 2015-11-16 NOTE — Patient Instructions (Signed)
1) Flexion Wall Stretch    Face wall, place affected handon wall in front of you. Slide hand up the wall  and lean body in towards the wall. Hold for 10-15 seconds. Repeat 3 times. 1-2 times/day.   3) Corner Stretch    Stand at a corner of a wall, place your arms on the walls with elbows bent. Lean into the corner until a stretch is felt along the front of your chest and/or shoulders. Hold for 10-15 seconds. Repeat 3X, 1-2 times/day.    Internal Rotation Across Back  Grab the end of a towel with your affected side, palm facing backwards. Grab the towel with your unaffected side and pull your affected hand across your back until you feel a stretch in the front of your shoulder. If you feel pain, pull just to the pain, do not pull through the pain. Hold. Return your affected arm to your side. Try to keep your hand/arm close to your body during the entire movement.  Hold 10-15 seconds. Repeat 3 times.     (Home) Extension: Isometric / Bilateral Arm Retraction - Sitting   Facing anchor, hold hands and elbow at shoulder height, with elbow bent.  Pull arms back to squeeze shoulder blades together. Repeat 10-15 times.  Copyright  VHI. All rights reserved.   (Home) Retraction: Row - Bilateral (Anchor)   Facing anchor, arms reaching forward, pull hands toward stomach, keeping elbows bent and at your sides and pinching shoulder blades together. Repeat 10-15 times.  Copyright  VHI. All rights reserved.   (Clinic) Extension / Flexion (Assist)   Face anchor, pull arms back, keeping elbow straight, and squeze shoulder blades together. Repeat 10-15 times.   Copyright  VHI. All rights reserved.   ELASTIC BAND SHOULDER EXTERNAL ROTATION  While holding an elastic band at your side with your elbow bent, start with your hand near your stomach and then pull the band away. Keep your elbow at your side the entire time.  Repeat 10-15 times.

## 2015-11-20 ENCOUNTER — Telehealth (HOSPITAL_COMMUNITY): Payer: Self-pay | Admitting: Hematology & Oncology

## 2015-11-20 NOTE — Telephone Encounter (Signed)
$  10,000.00 [place] 10/17/15-10/15/16 HW ID# [id number]

## 2015-11-21 ENCOUNTER — Ambulatory Visit (HOSPITAL_COMMUNITY): Payer: Medicare Other | Admitting: Occupational Therapy

## 2015-11-23 ENCOUNTER — Encounter (HOSPITAL_COMMUNITY): Payer: Medicare Other

## 2015-11-24 ENCOUNTER — Other Ambulatory Visit: Payer: Self-pay | Admitting: Family Medicine

## 2015-11-27 ENCOUNTER — Encounter: Payer: Self-pay | Admitting: Nurse Practitioner

## 2015-11-27 ENCOUNTER — Ambulatory Visit (INDEPENDENT_AMBULATORY_CARE_PROVIDER_SITE_OTHER): Payer: Medicare Other | Admitting: Nurse Practitioner

## 2015-11-27 VITALS — BP 102/70 | Temp 98.2°F | Ht 68.0 in | Wt 187.0 lb

## 2015-11-27 DIAGNOSIS — Z23 Encounter for immunization: Secondary | ICD-10-CM | POA: Diagnosis not present

## 2015-11-27 DIAGNOSIS — J310 Chronic rhinitis: Secondary | ICD-10-CM

## 2015-11-27 DIAGNOSIS — J329 Chronic sinusitis, unspecified: Secondary | ICD-10-CM | POA: Diagnosis not present

## 2015-11-27 MED ORDER — AMOXICILLIN-POT CLAVULANATE 875-125 MG PO TABS: 1.0000 | ORAL_TABLET | Freq: Two times a day (BID) | ORAL | Status: DC

## 2015-11-27 NOTE — Progress Notes (Signed)
Subjective:  Presents for c/o runny nose, cough and scratchy throat that began 3 days ago. No fever. Deep cough worse at night. Possible wheezing; has not used inhaler. No headache or ear pain.   Objective:   BP 102/70 mmHg  Temp(Src) 98.2 F (36.8 C) (Oral)  Ht 5\' 8"  (1.727 m)  Wt 187 lb (84.823 kg)  BMI 28.44 kg/m2 NAD. Alert, oriented. TMs clear effusion. Pharynx injected with PND noted. Neck supple with mild anterior adenopathy. Lungs clear. Heart RRR.  Assessment: Rhinosinusitis  Need for vaccination - Plan: Pneumococcal conjugate vaccine 13-valent IM  Plan:  Meds ordered this encounter  Medications  . amoxicillin-clavulanate (AUGMENTIN) 875-125 MG tablet    Sig: Take 1 tablet by mouth 2 (two) times daily.    Dispense:  20 tablet    Refill:  0    Order Specific Question:  Supervising Provider    Answer:  Mikey Kirschner [[id number]]   OTC meds as directed. Call back if worsens or persists.

## 2015-12-07 ENCOUNTER — Encounter: Payer: Self-pay | Admitting: Family Medicine

## 2015-12-07 ENCOUNTER — Ambulatory Visit (INDEPENDENT_AMBULATORY_CARE_PROVIDER_SITE_OTHER): Payer: Medicare Other | Admitting: Family Medicine

## 2015-12-07 VITALS — BP 130/80 | Temp 98.2°F | Ht 68.0 in | Wt 188.0 lb

## 2015-12-07 DIAGNOSIS — M5489 Other dorsalgia: Secondary | ICD-10-CM

## 2015-12-07 MED ORDER — CHLORZOXAZONE 500 MG PO TABS: 500.0000 mg | ORAL_TABLET | Freq: Three times a day (TID) | ORAL | Status: DC | PRN

## 2015-12-07 NOTE — Patient Instructions (Signed)
Take three ibuprofen three times per day or aleave three tabs twice per day for the next four or five days and should help the pain

## 2015-12-07 NOTE — Progress Notes (Signed)
   Subjective:    Patient ID: Jeremy Parrish, male    DOB: [birth year], 75 y.o.   MRN: [id number]  Cough This is a new problem. The current episode started in the past 7 days. The problem has been unchanged. The cough is non-productive. Associated symptoms comments: Back pain, diarrhea. Nothing aggravates the symptoms. Treatments tried: pain medication, muscle relaxer. The treatment provided no relief.   Patient has no other concerns at this time.   patient notes chest pain. Sharp. Worse with inspiration. Worse with motion. Primarily right sided. Was wondering about pneumonia. No fever cough generally nonproductive  Do for immunoglobulin infusion next week  Review of Systems  Respiratory: Positive for cough.        Objective:   Physical Exam Alert talkative no acute distress. Vitals stable. Lungs completely clear no coughing exam some pain or thorax tenderness to deep palpation       Assessment & Plan:  Impression probable chest wall pain/muscle skeletal pain plan ibuprofen 3 3 times a day plus chlorzoxazone. Symptom care discussed would try to hold off on further antibiotics rationale discussed

## 2015-12-09 ENCOUNTER — Other Ambulatory Visit: Payer: Self-pay | Admitting: Family Medicine

## 2015-12-11 ENCOUNTER — Encounter (HOSPITAL_BASED_OUTPATIENT_CLINIC_OR_DEPARTMENT_OTHER): Payer: Medicare Other

## 2015-12-11 ENCOUNTER — Encounter (HOSPITAL_COMMUNITY): Payer: Self-pay

## 2015-12-11 VITALS — BP 134/60 | HR 60 | Temp 97.9°F | Resp 18 | Wt 190.0 lb

## 2015-12-11 DIAGNOSIS — D801 Nonfamilial hypogammaglobulinemia: Secondary | ICD-10-CM | POA: Diagnosis present

## 2015-12-11 MED ORDER — IMMUNE GLOBULIN (HUMAN) 10 GM/100ML IV SOLN
0.4000 g/kg | Freq: Once | INTRAVENOUS | Status: AC
  Administered 2015-12-11: 35 g via INTRAVENOUS
  Filled 2015-12-11: qty 100

## 2015-12-11 MED ORDER — SODIUM CHLORIDE 0.9 % IV SOLN
Freq: Once | INTRAVENOUS | Status: AC
  Administered 2015-12-11: 12:00:00 via INTRAVENOUS

## 2015-12-11 MED ORDER — ACETAMINOPHEN 325 MG PO TABS
650.0000 mg | ORAL_TABLET | Freq: Four times a day (QID) | ORAL | Status: DC | PRN
  Administered 2015-12-11: 650 mg via ORAL
  Filled 2015-12-11: qty 2

## 2015-12-11 MED ORDER — HEPARIN SOD (PORK) LOCK FLUSH 100 UNIT/ML IV SOLN
500.0000 [IU] | Freq: Once | INTRAVENOUS | Status: AC | PRN
  Administered 2015-12-11: 500 [IU]
  Filled 2015-12-11: qty 5

## 2015-12-11 MED ORDER — SODIUM CHLORIDE 0.9 % IJ SOLN: 10.0000 mL | INTRAMUSCULAR | Status: DC | PRN

## 2015-12-11 NOTE — Patient Instructions (Signed)
..  [facility] Discharge Instructions for Patients   Beginning January 23rd 2017 lab work for the [name] will be done in the  Main lab at [facility] on 1st floor. If you have a lab appointment with the [facility] please come in thru the  Main Entrance and check in at the main information desk   Today you received the following agents  IVIG  Return as scheduled The clinic phone number is [phone]. Office hours are Monday-Friday 8:30am-5:00pm.  BELOW ARE SYMPTOMS THAT SHOULD BE REPORTED IMMEDIATELY:  *FEVER GREATER THAN 101.0 F  *CHILLS WITH OR WITHOUT FEVER  NAUSEA AND VOMITING THAT IS NOT CONTROLLED WITH YOUR NAUSEA MEDICATION  *UNUSUAL SHORTNESS OF BREATH  *UNUSUAL BRUISING OR BLEEDING  TENDERNESS IN MOUTH AND THROAT WITH OR WITHOUT PRESENCE OF ULCERS  *URINARY PROBLEMS  *BOWEL PROBLEMS  UNUSUAL RASH Items with * indicate a potential emergency and should be followed up as soon as possible. If you have an emergency after office hours please contact your primary care physician or go to the nearest emergency department.  Please call the clinic during office hours if you have any questions or concerns.   You may also contact the Patient Navigator at [phone] should you have any questions or need assistance in obtaining follow up care.

## 2015-12-11 NOTE — Progress Notes (Signed)
Tolerated infusion well. 

## 2015-12-12 ENCOUNTER — Ambulatory Visit (HOSPITAL_COMMUNITY): Payer: Medicare Other

## 2015-12-13 ENCOUNTER — Ambulatory Visit (HOSPITAL_COMMUNITY): Payer: Medicare Other

## 2015-12-21 ENCOUNTER — Other Ambulatory Visit (HOSPITAL_COMMUNITY): Payer: Self-pay | Admitting: Oncology

## 2015-12-21 DIAGNOSIS — C9001 Multiple myeloma in remission: Secondary | ICD-10-CM

## 2015-12-21 MED ORDER — LENALIDOMIDE 10 MG PO CAPS: 10.0000 mg | ORAL_CAPSULE | Freq: Every day | ORAL | Status: DC

## 2016-01-02 ENCOUNTER — Ambulatory Visit (INDEPENDENT_AMBULATORY_CARE_PROVIDER_SITE_OTHER): Payer: Medicare Other | Admitting: Cardiovascular Disease

## 2016-01-02 ENCOUNTER — Encounter: Payer: Self-pay | Admitting: Cardiovascular Disease

## 2016-01-02 VITALS — BP 144/74 | HR 76 | Ht 68.0 in | Wt 189.2 lb

## 2016-01-02 DIAGNOSIS — E785 Hyperlipidemia, unspecified: Secondary | ICD-10-CM

## 2016-01-02 DIAGNOSIS — I480 Paroxysmal atrial fibrillation: Secondary | ICD-10-CM | POA: Diagnosis not present

## 2016-01-02 DIAGNOSIS — R4701 Aphasia: Secondary | ICD-10-CM

## 2016-01-02 DIAGNOSIS — I2510 Atherosclerotic heart disease of native coronary artery without angina pectoris: Secondary | ICD-10-CM | POA: Diagnosis not present

## 2016-01-02 DIAGNOSIS — Z8679 Personal history of other diseases of the circulatory system: Secondary | ICD-10-CM

## 2016-01-02 DIAGNOSIS — I10 Essential (primary) hypertension: Secondary | ICD-10-CM

## 2016-01-02 DIAGNOSIS — Z950 Presence of cardiac pacemaker: Secondary | ICD-10-CM

## 2016-01-02 HISTORY — DX: Paroxysmal atrial fibrillation: I48.0

## 2016-01-02 MED ORDER — APIXABAN 5 MG PO TABS: 5.0000 mg | ORAL_TABLET | Freq: Two times a day (BID) | ORAL | Status: DC

## 2016-01-02 NOTE — Patient Instructions (Signed)
Your physician has recommended you make the following change in your medication:   STOP ASPIRIN AND CELEBREX  START ELIQUIS 5 MG TAKE ONE TABLET TWICE DAILY.    Remote monitoring is used to monitor your Pacemaker from home. This monitoring reduces the number of office visits required to check your device to one time per year. It allows [place] to monitor the functioning of your device to ensure it is working properly. You are scheduled for a device check from home on Apr 01, 2016. You may send your transmission at any time that day. If you have a wireless device, the transmission will be sent automatically. After your physician reviews your transmission, you will receive a postcard with your next transmission date.  Dr. Sallyanne Kuster recommends that you schedule a follow-up appointment in: [place] ([email or web address]).

## 2016-01-02 NOTE — Progress Notes (Signed)
Patient ID: Jeremy Parrish, male   DOB: Jun 28, 1941, 75 y.o.   MRN: [id number]    Cardiology Office Note    Date:  01/02/2016   ID:  Jeremy Parrish, DOB 11-Mar-1941, MRN [id number]  PCP:  Jeremy Hillier, MD  Cardiologist:  Jeremy Parrish, M.D. Jeremy Klein, MD   Chief Complaint  Patient presents with  . Annual Exam    no chest pain, no shortness of breath, has little edema, has cramping in legs at night, occassional lightheadedness or dizziness    History of Present Illness:  Jeremy Parrish is a 75 y.o. male who returns for pacemaker follow-up. 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 He has had a stroke in Nov 4239, complicated by significant residual expressive aphasia. No atrial fibrillation was detected on his pacemaker after his stroke until this evaluation today.  He presents today for a pacemaker check and has no specific cardiovascular complaints. Even though he has substantial expressive aphasia he is generally able to communicate with reasonable precision. He has not been aware of any palpitations. He has not had syncope and denies exertional dyspnea or angina. He has been working in the yard with shears and clippers and has not had any limitations in activity. He denies any bleeding complications. He has not had any new focal neurological complaints.  Pacemaker interrogation shows normal device function but also new documentation of atrial fibrillation. He has had about 13 minutes of sustained paroxysmal atrial fibrillation with controlled ventricular response since his last device check. Otherwise he is 93% atrial paced ventricular sensed rhythm. Pacing in the ventricle occurs only 1.5% of the time. He remains very active, roughly 4 hours a day. Battery voltage is  at 2.99 V (ERI 2.81 V). His device is a Medtronic Revo MRI conditional device.  Past Medical History  Diagnosis Date  . Hypertension   . Heart disease   . Kidney stones     history  . Lung mass   . Heart murmur   . 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 Coronary artery disease   . Depression   . Shortness of breath   . Peripheral arterial disease ([place])   . Bladder neck contracture   . Personal history of other diseases of circulatory system   . Aortic aneurysm of unspecified site without mention of rupture   . Arthritis   . Intestinovesical fistula   . Esophageal reflux   . Hyperlipidemia   . Anemia   . CHF (congestive heart failure) ([place])   . COPD (chronic obstructive pulmonary disease) ([place])   . Cerebral atherosclerosis     Carotid Doppler, 02/16/2013 - Bilateral Proximal ICAs,demonstrate mild plaque w/o evidence of significant diameter reduction, dissection, or any other vascular abnormality  . Complication of anesthesia   . PONV (postoperative nausea and vomiting)   . Stroke [facility]) 2013    Speech.  . Hx of bladder cancer 10/07/2012  . Sleep apnea     05-02-14 cpap , not yet used- suggested settings 5  . Cancer ([place])   . Prostate cancer ([place]) 2000  . Multiple myeloma   . Non Hodgkin's lymphoma ([place])   . Myocardial infarction [facility])     '96  . Shingles     Past  Surgical History  Procedure Laterality Date  . Prostate surgery    . Heart stents x 5  1999  . Portacath placement  07/26/2009    right chest  . Wrist surgery      right  . Left ear skin cancer removed    . Bone marrow transplant  2011  . Pacemaker insertion  07/22/2011    Medtronic  . Coronary angioplasty  06/24/2000    PCI and stenting in mid & proximal RCA  . Insert / replace / remove pacemaker    . Tee without cardioversion  10/13/2012    Procedure: TRANSESOPHAGEAL ECHOCARDIOGRAM (TEE);  Surgeon: Jeremy Klein, MD;  Location: [facility]  ENDOSCOPY;  Service: Cardiovascular;  Laterality: N/A;  pat/kay/echo notified  . Colonoscopy N/A 01/01/2013    Procedure: COLONOSCOPY;  Surgeon: Rogene Houston, MD;  Location: [facility] ENDO SUITE;  Service: Endoscopy;  Laterality: N/A;  825-moved to [place] notified pt  . Rotator    . Rotator cuff repair Right   . Colon surgery      colon resection  . Bladder surgery    . Shoulder arthroscopy with subacromial decompression Right 07/21/2013    Procedure: RIGHT SHOULDER ARTHROSCOPY WITH SUBACROMIAL DECOMPRESSION AND DEBRIDEMENT & Injection of Left Shoulder;  Surgeon: Alta Corning, MD;  Location: [place];  Service: Orthopedics;  Laterality: Right;  . US echocardiography  06/19/2011    RV mildly dilated,mild to mod. MR,mild AI,mild PI  . Nm myocar perf wall motion  11/27/2007    inferior scar  . Inguinal hernia repair Right 05/04/2014    Procedure: OPEN RIGHT INGUINAL HERNIA REPAIR with mesh;  Surgeon: Edward Jolly, MD;  Location: [facility] ORS;  Service: General;  Laterality: Right;    Outpatient Prescriptions Prior to Visit  Medication Sig Dispense Refill  . albuterol (PROVENTIL HFA;VENTOLIN HFA) 108 (90 BASE) MCG/ACT inhaler Inhale 2 puffs into the lungs every 6 (six) hours as needed for wheezing or shortness of breath. 1 Inhaler 2  . ALPRAZolam (XANAX) 0.5 MG tablet Take 1-2 tablets (0.5-1 mg total) by mouth at bedtime as needed for anxiety. 90 tablet 1  . amLODipine (NORVASC) 10 MG tablet TAKE 1 TABLET (10 MG TOTAL) BY MOUTH EVERY MORNING. 90 tablet 1  . aspirin EC 81 MG tablet Take 81 mg by mouth at bedtime.    . celecoxib (CELEBREX) 100 MG capsule Take 1 capsule (100 mg total) by mouth 2 (two) times daily. 60 capsule 2  . chlorzoxazone (PARAFON) 500 MG tablet Take 1 tablet (500 mg total) by mouth 3 (three) times daily as needed for muscle spasms. 60 tablet 2  . citalopram (CELEXA) 40 MG tablet TAKE 1 TABLET (40 MG TOTAL) BY MOUTH DAILY. 90 tablet 0  . clopidogrel (PLAVIX) 75 MG tablet TAKE 1 TABLET  BY MOUTH EVERY DAY 90 tablet 3  . fexofenadine (ALLEGRA ALLERGY) 180 MG tablet Take 1 tablet (180 mg total) by mouth daily. 30 tablet 5  . fluticasone (FLONASE) 50 MCG/ACT nasal spray Place 2 sprays into both nostrils daily. 16 g 6  . furosemide (LASIX) 20 MG tablet Use sparingly prn edema no more than one per week 21 tablet 0  . HYDROcodone-acetaminophen (NORCO/VICODIN) 5-325 MG tablet Take 1 tablet by mouth 2 (two) times daily as needed. 60 tablet 0  . KLOR-CON M20 20 MEQ tablet Take 20 mEq by mouth 2 (two) times daily. Reported on 11/15/2015    . lenalidomide (REVLIMID) 10 MG capsule Take 1 capsule (10 mg  total) by mouth daily. Take 1 capsule by mouth daily for 7 days on followed by 7 days off 14 capsule 0  . nitroGLYCERIN (NITROSTAT) 0.4 MG SL tablet Place 1 tablet (0.4 mg total) under the tongue every 5 (five) minutes as needed for chest pain. 25 tablet 0  . omeprazole (PRILOSEC) 20 MG capsule TAKE 1 CAPSULE (20 MG TOTAL) BY MOUTH DAILY. 90 capsule 1  . Potassium Chloride ER 20 MEQ TBCR Take 20 mEq by mouth 2 (two) times daily. 60 tablet 2  . simvastatin (ZOCOR) 20 MG tablet Take 1 tablet (20 mg total) by mouth at bedtime. 90 tablet 3   Facility-Administered Medications Prior to Visit  Medication Dose Route Frequency Provider Last Rate Last Dose  . acetaminophen (TYLENOL) tablet 650 mg  650 mg Oral Q6H PRN Baird Cancer, PA-C   650 mg at 12/09/14 0855  . sodium chloride 0.9 % injection 10 mL  10 mL Intracatheter PRN Baird Cancer, PA-C   10 mL at 12/09/14 1610     Allergies:   Diphenhydramine hcl; Morphine and related; and Tape   Social History   Social History  . Marital Status: Married    Spouse Name: [name]  . Number of Children: 3  . Years of Education: 9th   Occupational History  . retired     Social History Main Topics  . Smoking status: Former Smoker -- 1.00 packs/day for 20 years    Types: Cigarettes    Quit date: 11/14/1994  . Smokeless tobacco: Never Used  .  Alcohol Use: No     Comment: previously drank but none for at least 15 years.  . Drug Use: No  . Sexual Activity: Not Asked   Other Topics Concern  . None   Social History Narrative   Patient lives at home spouse.   Caffeine Use: Occasionally     Family History:  The patient's family history includes Cancer in his brother and father; Heart attack in his sister; Heart disease in his brother, father, and sister; Hyperlipidemia in his sister; Hypertension in his mother and sister. There is no history of Colon cancer or Colon polyps.   ROS:   Please see the history of present illness.    ROS All other systems reviewed and are negative.   PHYSICAL EXAM:   VS:  BP 144/74 mmHg  Pulse 76  Ht 5' 8"  (1.727 m)  Wt 85.815 kg (189 lb 3 oz)  BMI 28.77 kg/m2   GEN: Well nourished, well developed, in no acute distress HEENT: normal Neck: no JVD, carotid bruits, or masses Cardiac: RRR; no murmurs, rubs, or gallops,no edema , healthy left subclavian pacemaker site Respiratory:  clear to auscultation bilaterally, normal work of breathing GI: soft, nontender, nondistended, + BS MS: no deformity or atrophy Skin: warm and dry, no rash Neuro:  Alert and Oriented x 3, Strength and sensation are intact Psych: euthymic mood, full affect  Wt Readings from Last 3 Encounters:  01/02/16 85.815 kg (189 lb 3 oz)  12/11/15 86.183 kg (190 lb)  12/07/15 85.276 kg (188 lb)      Studies/Labs Reviewed:   EKG:  EKG is ordered today.  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 Borderline QRS at 120 ms. QTC 459 ms  Recent Labs: 11/14/2015: ALT 22; BUN 12; Creatinine, Ser 0.94; Hemoglobin 12.7*;  Platelets 141*; Potassium 4.0; Sodium 139   Lipid Panel    Component Value Date/Time   CHOL 104  12/27/2014 0844   TRIG 101 12/27/2014 0844   HDL 35* 12/27/2014 0844   CHOLHDL 3.0 12/27/2014 0844   VLDL 20 12/27/2014 0844   LDLCALC 49 12/27/2014 0844     ASSESSMENT:    1. Paroxysmal atrial fibrillation (HCC)   2. H/O cardiac pacemaker, Medtronic REVO, MRI conditional device, placed 07/2011 for sympyomatic bradycardia   3. Atherosclerosis of native coronary artery of native heart without angina pectoris   4. Expressive aphasia   5. Hyperlipidemia with target LDL less than 100   6. Essential hypertension      PLAN:  In order of problems listed above:  1. PAF: This was not documented around the time of his stroke in 2013. However it does place him at substantial risk for recurrent stroke.CHADSVasc score 6 (age, CAD, LV dysfunction, HTN, previous CVA). Will start Eliquis, stop aspirin and Celebrex, continue clopidogrel. Specific antiarrhythmic therapy does not appear to be indicated 2. PPM: Normal pacemaker function 3. CAD: asymptomatic, no angina 4. S/P CVA 5. HLP: LDL at target 6. HTN borderline elevated systolic blood pressure, no changes made today.    Medication Adjustments/Labs and Tests Ordered: Current medicines are reviewed at length with the patient today.  Concerns regarding medicines are outlined above.  Medication changes, Labs and Tests ordered today are listed in the Patient Instructions below. There are no Patient Instructions on file for this visit.     Mikael Spray, MD  01/02/2016 9:15 AM    [address], [place], [place]  [address] Phone: [phone]; Fax: [phone]

## 2016-01-03 ENCOUNTER — Telehealth: Payer: Self-pay | Admitting: Cardiovascular Disease

## 2016-01-03 NOTE — Telephone Encounter (Signed)
°*  STAT* If patient is at the pharmacy, call can be transferred to refill team.   1. Which medications need to be refilled? (please list name of each medication and dose if known) Xarelto  2. Which pharmacy/location (including street and city if local pharmacy) is medication to be sent to?[phone] 3. Do they need a 30 day or 90 day supply? 90 and refills

## 2016-01-03 NOTE — Telephone Encounter (Signed)
Spoke with patient, Eliquis requires PA.  Pharmacy states sent PA last night.  Will forward to [place].

## 2016-01-04 ENCOUNTER — Telehealth: Payer: Self-pay | Admitting: *Deleted

## 2016-01-04 NOTE — Telephone Encounter (Signed)
PA for Eliquis 5 mg done approved through 01/03/2019 case # [id number].

## 2016-01-08 ENCOUNTER — Encounter (HOSPITAL_COMMUNITY): Payer: Medicare Other | Attending: Oncology

## 2016-01-08 DIAGNOSIS — Z859 Personal history of malignant neoplasm, unspecified: Secondary | ICD-10-CM | POA: Insufficient documentation

## 2016-01-08 DIAGNOSIS — C9001 Multiple myeloma in remission: Secondary | ICD-10-CM

## 2016-01-08 DIAGNOSIS — Z8572 Personal history of non-Hodgkin lymphomas: Secondary | ICD-10-CM

## 2016-01-08 LAB — CBC WITH DIFFERENTIAL/PLATELET
Basophils Absolute: 0.1 10*3/uL (ref 0.0–0.1)
Basophils Relative: 2 %
EOS PCT: 3 %
Eosinophils Absolute: 0.2 10*3/uL (ref 0.0–0.7)
HEMATOCRIT: 38.7 % — AB (ref 39.0–52.0)
HEMOGLOBIN: 12.8 g/dL — AB (ref 13.0–17.0)
LYMPHS ABS: 1.5 10*3/uL (ref 0.7–4.0)
LYMPHS PCT: 32 %
MCH: 29.5 pg (ref 26.0–34.0)
MCHC: 33.1 g/dL (ref 30.0–36.0)
MCV: 89.2 fL (ref 78.0–100.0)
Monocytes Absolute: 0.7 10*3/uL (ref 0.1–1.0)
Monocytes Relative: 15 %
NEUTROS PCT: 48 %
Neutro Abs: 2.3 10*3/uL (ref 1.7–7.7)
Platelets: 156 10*3/uL (ref 150–400)
RBC: 4.34 MIL/uL (ref 4.22–5.81)
RDW: 15.6 % — ABNORMAL HIGH (ref 11.5–15.5)
WBC: 4.8 10*3/uL (ref 4.0–10.5)

## 2016-01-08 LAB — COMPREHENSIVE METABOLIC PANEL
ALK PHOS: 74 U/L (ref 38–126)
ALT: 17 U/L (ref 17–63)
AST: 22 U/L (ref 15–41)
Albumin: 4 g/dL (ref 3.5–5.0)
Anion gap: 8 (ref 5–15)
BUN: 11 mg/dL (ref 6–20)
CALCIUM: 9.1 mg/dL (ref 8.9–10.3)
CO2: 26 mmol/L (ref 22–32)
CREATININE: 0.98 mg/dL (ref 0.61–1.24)
Chloride: 105 mmol/L (ref 101–111)
Glucose, Bld: 79 mg/dL (ref 65–99)
Potassium: 4 mmol/L (ref 3.5–5.1)
Sodium: 139 mmol/L (ref 135–145)
Total Bilirubin: 0.6 mg/dL (ref 0.3–1.2)
Total Protein: 7 g/dL (ref 6.5–8.1)

## 2016-01-08 LAB — LACTATE DEHYDROGENASE: LDH: 133 U/L (ref 98–192)

## 2016-01-08 LAB — SEDIMENTATION RATE: SED RATE: 6 mm/h (ref 0–16)

## 2016-01-08 LAB — C-REACTIVE PROTEIN: CRP: <0.5 mg/dL (ref <1.0)

## 2016-01-09 LAB — IMMUNOFIXATION ELECTROPHORESIS
IGM, SERUM: 35 mg/dL (ref 15–143)
IgA: 277 mg/dL (ref 61–437)
IgG (Immunoglobin G), Serum: 896 mg/dL (ref 700–1600)
TOTAL PROTEIN ELP: 6.7 g/dL (ref 6.0–8.5)

## 2016-01-09 LAB — PROTEIN ELECTROPHORESIS, SERUM
A/G RATIO SPE: 1.1 (ref 0.7–1.7)
ALBUMIN ELP: 3.5 g/dL (ref 2.9–4.4)
Alpha-1-Globulin: 0.2 g/dL (ref 0.0–0.4)
Alpha-2-Globulin: 0.8 g/dL (ref 0.4–1.0)
Beta Globulin: 1.1 g/dL (ref 0.7–1.3)
GLOBULIN, TOTAL: 3.3 g/dL (ref 2.2–3.9)
Gamma Globulin: 1.2 g/dL (ref 0.4–1.8)
TOTAL PROTEIN ELP: 6.8 g/dL (ref 6.0–8.5)

## 2016-01-09 LAB — IGG, IGA, IGM
IGG (IMMUNOGLOBIN G), SERUM: 934 mg/dL (ref 700–1600)
IgA: 287 mg/dL (ref 61–437)
IgM, Serum: 36 mg/dL (ref 15–143)

## 2016-01-09 LAB — KAPPA/LAMBDA LIGHT CHAINS
KAPPA FREE LGHT CHN: 29.39 mg/L — AB (ref 3.30–19.40)
KAPPA, LAMDA LIGHT CHAIN RATIO: 1.24 (ref 0.26–1.65)
LAMDA FREE LIGHT CHAINS: 23.75 mg/L (ref 5.71–26.30)

## 2016-01-09 LAB — BETA 2 MICROGLOBULIN, SERUM: BETA 2 MICROGLOBULIN: 2 mg/L (ref 0.6–2.4)

## 2016-01-09 NOTE — Telephone Encounter (Signed)
PA authorized through 12/2018

## 2016-01-10 NOTE — Assessment & Plan Note (Signed)
Receiving IVIG every 4 weeks without any recent documented infections.  Tolerating well.   IVIG today and repeated every 4 weeks. 

## 2016-01-10 NOTE — Assessment & Plan Note (Addendum)
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 He has been on maintenance Revlimid 10 mg for 7 days on and 7 days off without any intolerances to date.  Continue maintenance Revlimid. He was on ASA daily, but this was recently discontinued by his cardiologist. He is now on Eliquis for paroxysmal atrial fibrillation. He is continued on Plavix as well.  Medication list is updated accordingly.    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  He was seen at [facility] in 09/22/2015 and his providers there were pleased with his status from a MM perspective.  His last bone marrow bx was in 2011.  He is not opposed to a bone marrow aspiration and biopsy at this time, but he is a little confused as the reasoning for one in light of his November appointment at [facility].  I've prepped him for the possibility of repeat bone marrow aspiration in the future.  Reports a headache, over the past 2 days, it comes and goes. He denies being the worst headache use ever had. He does admit he does not have headaches often. He notes that it is well controlled at home with Tylenol. He has not taken any Tylenol today. He get 650 mg of Tylenol as a premedication for IVIG today. This will be administered accordingly. He notes that he should report to the emergency room if his headache progresses and becomes worse headache use ever experienced. Recently started Eliquis as mentioned above. He has no neurologic deficits.  Labs every 8 weeks: CBC diff, CMET, CRP, B2M, SPEP + IFE, and light chain assay.  Return in 4 months for follow-up.

## 2016-01-10 NOTE — Progress Notes (Signed)
Jeremy Hillier, MD [place] [address]  Multiple myeloma in remission [facility])  Hx of lymphoma  Hypogammaglobulinemia ([place])  CURRENT THERAPY: Revlimid 10 mg daily for 7 days on and 7 days off and monthly low-dose IVIG, on Eliquis for paroxysmal atrial fibrillation (managed by cardiology).  INTERVAL HISTORY: Jeremy Parrish 75 y.o. 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 Marcell Parrish. 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 In 2011 he was diagnosed with IgG lambda multiple myeloma and treated with 4 cycles of Velcade and dexamethasone followed by autologous peripheral blood stem cell transplant and currently remains on maintenance Revlimid and also monthly intravenous IgG. Zometa 4 mg intravenously monthly was discontinued with last treatment on 08/18/2014.  AND Hypogammaglobulinemia with history of frequent and recurrent infections requiring antibiotics and high dose IVIG until monthly low-dose IVIG was instituted with an excellent response with minimal antibiotic requirements.  personally reviewed and went over laboratory results with the patient.  The results are noted within this dictation.  Labs are very stable.  He notes a recent diagnosis of paroxysmal atrial fibrillation that his pacemaker captured. He's been seen by his cardiologist and started on Eliquis. Today, during exam, he is in normal sinus rhythm. He is tolerating Eliquis well. No bleeding.  He admits to an intermittent headache over the past 2 days. He denies a being the worst headache is ever had. He reports that over the past 2 days it's, and gone. He's been using  over-the-counter Tylenol which has been effective for this. He is not taking any today. He is due for IVIG today and he will receive premeds accordingly, including Tylenol.  No hospitalizations appreciated since his last office visit with [place]. No signs or symptoms of bacterial infection. Not on any antibiotic at this point time.      Past Medical History  Diagnosis Date  . Hypertension   . Heart disease   . Kidney stones     history  . Lung mass   . Heart murmur   . Hypogammaglobulinemia ([place]) 09/28/2012    Secondary to Lymphoma and Multiple Myeloma and their treatments  . Coronary artery disease   . Depression   . Shortness of breath   . Peripheral arterial disease ([place])   . Bladder neck contracture   . Personal history of other diseases of circulatory system   . Aortic aneurysm of unspecified site without mention of rupture   . Arthritis   . Intestinovesical fistula   . Esophageal reflux   . Hyperlipidemia   . Anemia   . CHF (congestive heart failure) ([place])   . COPD (chronic obstructive pulmonary disease) ([place])   . Cerebral atherosclerosis     Carotid Doppler, 02/16/2013 - Bilateral Proximal ICAs,demonstrate mild plaque w/o evidence of significant diameter reduction, dissection, or any other vascular abnormality  . Complication of anesthesia   . PONV (postoperative nausea and vomiting)   . Stroke [facility]) 2013    Speech.  . Hx of bladder cancer 10/07/2012  . Sleep apnea     05-02-14 cpap , not yet used- suggested settings 5  . Cancer (  [place])   . Prostate cancer ([place]) 2000  . Multiple myeloma   . Non Hodgkin's lymphoma ([place])   . Myocardial infarction [facility])     '96  . Shingles   . 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; H/O cardiac pacemaker, Medtronic REVO, MRI conditional device, placed 07/2011 for sympyomatic bradycardia; Hx of bladder cancer; Hyperlipidemia with target LDL less than 100; Prediabetes; Expressive aphasia; Hemiplegia affecting right dominant side ([place]); Aneurysm of iliac artery ([place]); Shortness of breath; Rotator cuff tear arthropathy of right shoulder; Left rotator cuff tear arthropathy; Muscle weakness (generalized); Status post arthroscopy of shoulder; Pain in joint, shoulder region; Fever; Pneumonia; Pancytopenia ([place]); Chest pain with moderate risk for cardiac etiology; Inguinal hernia; Obstructive sleep apnea; Central sleep apnea; Hypokalemia; URI (upper respiratory infection); Weakness; and Paroxysmal atrial fibrillation (HCC) on his problem list.     is allergic to diphenhydramine hcl; morphine and related; and tape.  Current Outpatient Prescriptions on File Prior to Visit  Medication Sig Dispense Refill  . amLODipine (NORVASC) 10 MG tablet TAKE 1 TABLET (10 MG TOTAL) BY MOUTH EVERY MORNING. 90 tablet 1  . apixaban (ELIQUIS) 5 MG TABS tablet Take 1 tablet (5 mg total) by mouth 2 (two) times daily. 60 tablet 5  . citalopram (CELEXA) 40 MG tablet TAKE 1 TABLET (40 MG TOTAL) BY MOUTH DAILY. 90 tablet 0  . clopidogrel (PLAVIX) 75 MG tablet TAKE 1 TABLET BY MOUTH EVERY DAY 90 tablet 3  . KLOR-CON M20 20 MEQ tablet Take 20 mEq by mouth 2 (two) times daily. Reported on 11/15/2015    . lenalidomide (REVLIMID) 10 MG capsule Take 1 capsule (10 mg total) by mouth daily. Take 1 capsule by mouth daily for 7 days on followed by 7 days off 14 capsule 0  . omeprazole (PRILOSEC) 20 MG capsule TAKE 1 CAPSULE (20 MG TOTAL) BY MOUTH DAILY. 90 capsule 1  . simvastatin (ZOCOR) 20 MG tablet Take 1 tablet (20 mg total) by mouth at bedtime. 90 tablet 3  .  albuterol (PROVENTIL HFA;VENTOLIN HFA) 108 (90 BASE) MCG/ACT inhaler Inhale 2 puffs into the lungs every 6 (six) hours as needed for wheezing or shortness of breath. (Patient not taking: Reported on 01/11/2016) 1 Inhaler 2  . ALPRAZolam (XANAX) 0.5 MG tablet Take 1-2 tablets (0.5-1 mg total) by mouth at bedtime as needed for anxiety. (Patient not taking: Reported on 01/11/2016) 90 tablet 1  . chlorzoxazone (PARAFON) 500 MG tablet Take 1 tablet (500 mg total) by mouth 3 (three) times daily as needed for muscle spasms. (Patient not taking: Reported on 01/11/2016) 60 tablet 2  . diphenhydrAMINE (BENADRYL) 25 MG tablet Take 25 mg by mouth at bedtime. Reported on 01/11/2016    . fexofenadine (ALLEGRA ALLERGY) 180 MG tablet Take 1 tablet (180 mg total) by mouth daily. (Patient not taking: Reported on 01/11/2016) 30 tablet 5  . fluticasone (FLONASE) 50 MCG/ACT nasal spray Place 2 sprays into both nostrils daily. (Patient not taking: Reported on 01/11/2016) 16 g 6  . furosemide (LASIX) 20 MG tablet Use sparingly prn edema no more than one per week (Patient not taking: Reported on 01/11/2016) 21 tablet 0  . HYDROcodone-acetaminophen (NORCO/VICODIN) 5-325 MG  tablet Take 1 tablet by mouth 2 (two) times daily as needed. (Patient not taking: Reported on 01/11/2016) 60 tablet 0  . nitroGLYCERIN (NITROSTAT) 0.4 MG SL tablet Place 1 tablet (0.4 mg total) under the tongue every 5 (five) minutes as needed for chest pain. (Patient not taking: Reported on 01/11/2016) 25 tablet 0   Current Facility-Administered Medications on File Prior to Visit  Medication Dose Route Frequency Provider Last Rate Last Dose  . 0.9 %  sodium chloride infusion   Intravenous Once Patrici Ranks, MD      . acetaminophen (TYLENOL) tablet 650 mg  650 mg Oral Q6H PRN Baird Cancer, PA-C   650 mg at 12/09/14 0855  . acetaminophen (TYLENOL) tablet 650 mg  650 mg Oral Q6H PRN Patrici Ranks, MD      . heparin lock flush 100 unit/mL  500 Units Intracatheter  Once PRN Patrici Ranks, MD      . Immune Globulin 10% (OCTAGAM) IV infusion 35 g  400 mg/kg Intravenous Once Patrici Ranks, MD      . sodium chloride 0.9 % injection 10 mL  10 mL Intracatheter PRN Baird Cancer, PA-C   10 mL at 12/09/14 0856  . sodium chloride 0.9 % injection 10 mL  10 mL Intracatheter PRN Patrici Ranks, MD        Past Surgical History  Procedure Laterality Date  . Prostate surgery    . Heart stents x 5  1999  . Portacath placement  07/26/2009    right chest  . Wrist surgery      right  . Left ear skin cancer removed    . Bone marrow transplant  2011  . Pacemaker insertion  07/22/2011    Medtronic  . Coronary angioplasty  06/24/2000    PCI and stenting in mid & proximal RCA  . Insert / replace / remove pacemaker    . Tee without cardioversion  10/13/2012    Procedure: TRANSESOPHAGEAL ECHOCARDIOGRAM (TEE);  Surgeon: Sanda Klein, MD;  Location: [facility] ENDOSCOPY;  Service: Cardiovascular;  Laterality: N/A;  pat/kay/echo notified  . Colonoscopy N/A 01/01/2013    Procedure: COLONOSCOPY;  Surgeon: Rogene Houston, MD;  Location: [facility] ENDO SUITE;  Service: Endoscopy;  Laterality: N/A;  825-moved to [place] notified pt  . Rotator    . Rotator cuff repair Right   . Colon surgery      colon resection  . Bladder surgery    . Shoulder arthroscopy with subacromial decompression Right 07/21/2013    Procedure: RIGHT SHOULDER ARTHROSCOPY WITH SUBACROMIAL DECOMPRESSION AND DEBRIDEMENT & Injection of Left Shoulder;  Surgeon: Alta Corning, MD;  Location: [place];  Service: Orthopedics;  Laterality: Right;  . US echocardiography  06/19/2011    RV mildly dilated,mild to mod. MR,mild AI,mild PI  . Nm myocar perf wall motion  11/27/2007    inferior scar  . Inguinal hernia repair Right 05/04/2014    Procedure: OPEN RIGHT INGUINAL HERNIA REPAIR with mesh;  Surgeon: Edward Jolly, MD;  Location: [facility] ORS;  Service: General;  Laterality: Right;    Denies any headaches, dizziness,  double vision, fevers, chills, night sweats, nausea, vomiting, diarrhea, constipation, chest pain, heart palpitations, shortness of breath, blood in stool, black tarry stool, urinary pain, urinary burning, urinary frequency, hematuria.   PHYSICAL EXAMINATION  ECOG PERFORMANCE STATUS: 1 - Symptomatic but completely ambulatory  Filed Vitals:   01/11/16 1020  BP: 140/71  Pulse: 66  Temp:  6 F (36.7 C)  Resp: 18    GENERAL:alert, no distress, well nourished, well developed, comfortable, cooperative, smiling, unaccompanied SKIN: skin color, texture, turgor are normal, no rashes or significant lesions HEAD: Normocephalic, No masses, lesions, tenderness or abnormalities EYES: normal, PERRLA, EOMI, Conjunctiva are pink and non-injected EARS: External ears normal OROPHARYNX:lips, buccal mucosa, and tongue normal and mucous membranes are moist  NECK: supple, thyroid normal size, non-tender, without nodularity, trachea midline LYMPH:  no palpable lymphadenopathy BREAST:not examined LUNGS: clear to auscultation and percussion without wheezes, rales, rhonchi. HEART: regular rate & rhythm, no murmurs, no gallops, S1 normal and S2 normal ABDOMEN:abdomen soft, non-tender and normal bowel sounds BACK: Back symmetric, no curvature., No CVA tenderness EXTREMITIES:less then 2 second capillary refill, no joint deformities, effusion, or inflammation, no skin discoloration, no cyanosis  NEURO: alert & oriented x 3 with expressive aphasia (Broca's aphasia), no focal motor/sensory deficits, gait normal   LABORATORY DATA: CBC    Component Value Date/Time   WBC 4.8 01/08/2016 1012   RBC 4.34 01/08/2016 1012   HGB 12.8* 01/08/2016 1012   HCT 38.7* 01/08/2016 1012   PLT 156 01/08/2016 1012   MCV 89.2 01/08/2016 1012   MCH 29.5 01/08/2016 1012   MCHC 33.1 01/08/2016 1012   RDW 15.6* 01/08/2016 1012   LYMPHSABS 1.5 01/08/2016 1012   MONOABS 0.7 01/08/2016 1012   EOSABS 0.2 01/08/2016 1012    BASOSABS 0.1 01/08/2016 1012      Chemistry      Component Value Date/Time   NA 139 01/08/2016 1012   K 4.0 01/08/2016 1012   CL 105 01/08/2016 1012   CO2 26 01/08/2016 1012   BUN 11 01/08/2016 1012   CREATININE 0.98 01/08/2016 1012   CREATININE 0.80 11/07/2014 1034      Component Value Date/Time   CALCIUM 9.1 01/08/2016 1012   ALKPHOS 74 01/08/2016 1012   AST 22 01/08/2016 1012   ALT 17 01/08/2016 1012   BILITOT 0.6 01/08/2016 1012      Lab Results  Component Value Date   PROT 7.0 01/08/2016   ALBUMINELP 3.5 01/08/2016   A1GS 0.2 01/08/2016   A2GS 0.8 01/08/2016   BETS 1.1 01/08/2016   BETA2SER 5.5 01/06/2015   GAMS 1.2 01/08/2016   MSPIKE Not Observed 01/08/2016   SPEI Comment 01/08/2016   SPECOM Comment 01/08/2016   IGGSERUM 896 01/08/2016   IGA 277 01/08/2016   IGMSERUM 35 01/08/2016   IMMELINT (NOTE) 01/06/2015   KPAFRELGTCHN 29.39* 01/08/2016   LAMBDASER 23.75 01/08/2016   KAPLAMBRATIO 1.24 01/08/2016    PENDING LABS:   RADIOGRAPHIC STUDIES:  No results found.   PATHOLOGY:    ASSESSMENT AND PLAN:  Multiple myeloma in remission Pleasant 75 year old male with a history of IgG lambda multiple myeloma status post stem cell transplant in October 2011. He has been on maintenance Revlimid 10 mg for 7 days on and 7 days off without any intolerances to date.  Continue maintenance Revlimid. He was on ASA daily, but this was recently discontinued by his cardiologist. He is now on Eliquis for paroxysmal atrial fibrillation. He is continued on Plavix as well.  Medication list is updated accordingly.    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  He was seen at [facility] in 09/22/2015 and his providers there were pleased with his status from a MM perspective.  His last bone marrow bx was in 2011.  He is not opposed to a bone marrow aspiration and  biopsy at this time, but he is a little confused as the reasoning for one in light of his November  appointment at [facility].  I've prepped him for the possibility of repeat bone marrow aspiration in the future.  Reports a headache, over the past 2 days, it comes and goes. He denies being the worst headache use ever had. He does admit he does not have headaches often. He notes that it is well controlled at home with Tylenol. He has not taken any Tylenol today. He get 650 mg of Tylenol as a premedication for IVIG today. This will be administered accordingly. He notes that he should report to the emergency room if his headache progresses and becomes worse headache use ever experienced. Recently started Eliquis as mentioned above. He has no neurologic deficits.  Labs every 8 weeks: CBC diff, CMET, CRP, B2M, SPEP + IFE, and light chain assay.  Return in 4 months for follow-up.  Hx of lymphoma NED  Hypogammaglobulinemia Receiving IVIG every 4 weeks without any recent documented infections.  Tolerating well.   IVIG today and repeated every 4 weeks.    THERAPY PLAN:  Continue with current treatment.  Continue monthly IVIG.  Continue Revlimid 7 days on and 7 days off.  We will monitor WBC.  He may need a bone marrow aspiration and biopsy in the future.  All questions were answered. The patient knows to call the clinic with any problems, questions or concerns. We can certainly see the patient much sooner if necessary.  Patient and plan discussed with Dr. Ancil Linsey and she is in agreement with the aforementioned.   This note is electronically signed by: Robynn Pane, PA-C 01/11/2016 11:09 AM

## 2016-01-10 NOTE — Assessment & Plan Note (Signed)
NED

## 2016-01-11 ENCOUNTER — Encounter (HOSPITAL_BASED_OUTPATIENT_CLINIC_OR_DEPARTMENT_OTHER): Payer: Medicare Other | Admitting: Oncology

## 2016-01-11 ENCOUNTER — Encounter (HOSPITAL_COMMUNITY): Payer: Self-pay | Admitting: Oncology

## 2016-01-11 ENCOUNTER — Encounter (HOSPITAL_COMMUNITY): Payer: Self-pay

## 2016-01-11 ENCOUNTER — Encounter (HOSPITAL_COMMUNITY): Payer: Medicare Other | Attending: Oncology

## 2016-01-11 ENCOUNTER — Ambulatory Visit (HOSPITAL_COMMUNITY): Payer: Medicare Other | Admitting: Oncology

## 2016-01-11 VITALS — BP 140/71 | HR 66 | Temp 98.0°F | Resp 18 | Wt 188.0 lb

## 2016-01-11 VITALS — BP 142/71 | HR 60 | Temp 97.9°F | Resp 20

## 2016-01-11 DIAGNOSIS — Z8572 Personal history of non-Hodgkin lymphomas: Secondary | ICD-10-CM

## 2016-01-11 DIAGNOSIS — Z859 Personal history of malignant neoplasm, unspecified: Secondary | ICD-10-CM

## 2016-01-11 DIAGNOSIS — R51 Headache: Secondary | ICD-10-CM

## 2016-01-11 DIAGNOSIS — C9001 Multiple myeloma in remission: Secondary | ICD-10-CM | POA: Insufficient documentation

## 2016-01-11 DIAGNOSIS — D801 Nonfamilial hypogammaglobulinemia: Secondary | ICD-10-CM | POA: Diagnosis not present

## 2016-01-11 DIAGNOSIS — C9000 Multiple myeloma not having achieved remission: Secondary | ICD-10-CM | POA: Insufficient documentation

## 2016-01-11 MED ORDER — ACETAMINOPHEN 325 MG PO TABS
ORAL_TABLET | ORAL | Status: AC
  Filled 2016-01-11: qty 2

## 2016-01-11 MED ORDER — ACETAMINOPHEN 325 MG PO TABS
650.0000 mg | ORAL_TABLET | Freq: Four times a day (QID) | ORAL | Status: DC | PRN
  Administered 2016-01-11: 650 mg via ORAL

## 2016-01-11 MED ORDER — DIPHENHYDRAMINE HCL 25 MG PO CAPS
ORAL_CAPSULE | ORAL | Status: AC
  Filled 2016-01-11: qty 1

## 2016-01-11 MED ORDER — HEPARIN SOD (PORK) LOCK FLUSH 100 UNIT/ML IV SOLN
500.0000 [IU] | Freq: Once | INTRAVENOUS | Status: AC | PRN
  Administered 2016-01-11: 500 [IU]

## 2016-01-11 MED ORDER — IMMUNE GLOBULIN (HUMAN) 5 GM/50ML IV SOLN
400.0000 mg/kg | Freq: Once | INTRAVENOUS | Status: AC
  Administered 2016-01-11: 35 g via INTRAVENOUS
  Filled 2016-01-11: qty 50

## 2016-01-11 MED ORDER — IMMUNE GLOBULIN (HUMAN) 10 GM/100ML IV SOLN: 0.4000 g/kg | Freq: Once | INTRAVENOUS | Status: DC

## 2016-01-11 MED ORDER — SODIUM CHLORIDE 0.9 % IV SOLN
Freq: Once | INTRAVENOUS | Status: AC
  Administered 2016-01-11: 11:00:00 via INTRAVENOUS

## 2016-01-11 MED ORDER — IMMUNE GLOBULIN (HUMAN) 20 GM/200ML IV SOLN
400.0000 mg/kg | INTRAVENOUS | Status: DC
  Filled 2016-01-11 (×2): qty 50

## 2016-01-11 MED ORDER — DIPHENHYDRAMINE HCL 25 MG PO CAPS
25.0000 mg | ORAL_CAPSULE | Freq: Four times a day (QID) | ORAL | Status: DC | PRN
  Administered 2016-01-11: 25 mg via ORAL

## 2016-01-11 MED ORDER — SODIUM CHLORIDE 0.9 % IJ SOLN
10.0000 mL | INTRAMUSCULAR | Status: DC | PRN
  Administered 2016-01-11: 10 mL
  Filled 2016-01-11: qty 10

## 2016-01-11 NOTE — Patient Instructions (Addendum)
[place] at [facility] Discharge Instructions  RECOMMENDATIONS MADE BY THE CONSULTANT AND ANY TEST RESULTS WILL BE SENT TO YOUR REFERRING PHYSICIAN.  Exam and discussion today with Kirby Crigler, PA. IVIG infusion today and every 4 weeks. Lab work every 8 weeks. Office visit in 4 months.   Thank you for choosing [place] at [facility] to provide your oncology and hematology care.  To afford each patient quality time with our provider, please arrive at least 15 minutes before your scheduled appointment time.   Beginning January 23rd 2017 lab work for the [name] will be done in the  Main lab at [facility] on 1st floor. If you have a lab appointment with the [facility] please come in thru the  Main Entrance and check in at the main information desk  You need to re-schedule your appointment should you arrive 10 or more minutes late.  We strive to give you quality time with our providers, and arriving late affects you and other patients whose appointments are after yours.  Also, if you no show three or more times for appointments you may be dismissed from the clinic at the providers discretion.     Again, thank you for choosing [facility].  Our hope is that these requests will decrease the amount of time that you wait before being seen by our physicians.       _____________________________________________________________  Should you have questions after your visit to [facility], please contact our office at [phone] between the hours of 8:30 a.m. and 4:30 p.m.  Voicemails left after 4:30 p.m. will not be returned until the following business day.  For prescription refill requests, have your pharmacy contact our office.         Resources For Cancer Patients and their Caregivers ? American Cancer Society: Can assist with transportation, wigs, general needs, runs Look Good Feel Better.         714-222-1147 ? Cancer Care: Provides financial assistance, online support groups, medication/co-pay assistance.  1-800-813-HOPE 603-400-5278) ? [place] Assists [place] cancer patients and their families through emotional , educational and financial support.  9413298624 ? [place] DSS Where to apply for food stamps, Medicaid and utility assistance. 613-260-2494 ? RCATS: Transportation to medical appointments. 978-434-2978 ? Social Security Administration: May apply for disability if have a Stage IV cancer. 334-202-2805 (520)123-5303 ? [employer], Disability and Transit Services: Assists with nutrition, care and transit needs. 2344357792

## 2016-01-11 NOTE — Patient Instructions (Signed)
[place] at [facility] Discharge Instructions  RECOMMENDATIONS MADE BY THE CONSULTANT AND ANY TEST RESULTS WILL BE SENT TO YOUR REFERRING PHYSICIAN.  You received IVIG today per orders.  Follow up as scheduled.  Thank you for choosing [place] at [facility] to provide your oncology and hematology care.  To afford each patient quality time with our provider, please arrive at least 15 minutes before your scheduled appointment time.   Beginning January 23rd 2017 lab work for the [name] will be done in the  Main lab at [facility] on 1st floor. If you have a lab appointment with the [facility] please come in thru the  Main Entrance and check in at the main information desk  You need to re-schedule your appointment should you arrive 10 or more minutes late.  We strive to give you quality time with our providers, and arriving late affects you and other patients whose appointments are after yours.  Also, if you no show three or more times for appointments you may be dismissed from the clinic at the providers discretion.     Again, thank you for choosing [facility].  Our hope is that these requests will decrease the amount of time that you wait before being seen by our physicians.       _____________________________________________________________  Should you have questions after your visit to [facility], please contact our office at [phone] between the hours of 8:30 a.m. and 4:30 p.m.  Voicemails left after 4:30 p.m. will not be returned until the following business day.  For prescription refill requests, have your pharmacy contact our office.         Resources For Cancer Patients and their Caregivers ? American Cancer Society: Can assist with transportation, wigs, general needs, runs Look Good Feel Better.        580-539-0995 ? Cancer Care: Provides financial assistance, online support groups,  medication/co-pay assistance.  1-800-813-HOPE 323-705-0176) ? [place] Assists [place] cancer patients and their families through emotional , educational and financial support.  210-083-2920 ? [place] DSS Where to apply for food stamps, Medicaid and utility assistance. (928) 476-6166 ? RCATS: Transportation to medical appointments. (203)751-4562 ? Social Security Administration: May apply for disability if have a Stage IV cancer. 630-067-0331 [phone])041-6401 ? [employer], Disability and Transit Services: Assists with nutrition, care and transit needs. 380-638-2015

## 2016-01-11 NOTE — Progress Notes (Signed)
Patient received IVIG today and tolerated it well .

## 2016-01-20 ENCOUNTER — Other Ambulatory Visit: Payer: Self-pay | Admitting: Cardiovascular Disease

## 2016-01-25 ENCOUNTER — Other Ambulatory Visit (HOSPITAL_COMMUNITY): Payer: Self-pay | Admitting: Oncology

## 2016-01-25 DIAGNOSIS — C9001 Multiple myeloma in remission: Secondary | ICD-10-CM

## 2016-01-25 MED ORDER — LENALIDOMIDE 10 MG PO CAPS: 10.0000 mg | ORAL_CAPSULE | Freq: Every day | ORAL | Status: DC

## 2016-02-07 ENCOUNTER — Ambulatory Visit (INDEPENDENT_AMBULATORY_CARE_PROVIDER_SITE_OTHER): Payer: Medicare Other | Admitting: Family Medicine

## 2016-02-07 ENCOUNTER — Encounter: Payer: Self-pay | Admitting: Family Medicine

## 2016-02-07 VITALS — BP 142/80 | Temp 99.1°F | Ht 68.0 in | Wt 190.0 lb

## 2016-02-07 DIAGNOSIS — R1030 Lower abdominal pain, unspecified: Secondary | ICD-10-CM | POA: Diagnosis not present

## 2016-02-07 DIAGNOSIS — J4520 Mild intermittent asthma, uncomplicated: Secondary | ICD-10-CM | POA: Diagnosis not present

## 2016-02-07 DIAGNOSIS — I2510 Atherosclerotic heart disease of native coronary artery without angina pectoris: Secondary | ICD-10-CM

## 2016-02-07 DIAGNOSIS — J019 Acute sinusitis, unspecified: Secondary | ICD-10-CM | POA: Diagnosis not present

## 2016-02-07 DIAGNOSIS — J209 Acute bronchitis, unspecified: Secondary | ICD-10-CM | POA: Diagnosis not present

## 2016-02-07 DIAGNOSIS — R1031 Right lower quadrant pain: Secondary | ICD-10-CM

## 2016-02-07 MED ORDER — ALBUTEROL SULFATE (2.5 MG/3ML) 0.083% IN NEBU
2.5000 mg | INHALATION_SOLUTION | Freq: Once | RESPIRATORY_TRACT | Status: AC
  Administered 2016-02-07: 2.5 mg via RESPIRATORY_TRACT

## 2016-02-07 MED ORDER — AMOXICILLIN-POT CLAVULANATE 875-125 MG PO TABS: 1.0000 | ORAL_TABLET | Freq: Two times a day (BID) | ORAL | Status: DC

## 2016-02-07 NOTE — Progress Notes (Signed)
   Subjective:    Patient ID: Jeremy Parrish, male    DOB: Aug 17, 1941, 75 y.o.   MRN: [id number]  Cough This is a new problem. Episode onset: 2 weeks ago. Associated symptoms include a fever, headaches, nasal congestion, a sore throat and wheezing. Associated symptoms comments: diarrhea. Treatments tried: mucinex, alka seltzer, tylenol, xyzal.   Rash on left arm. Came up a while ago. Not painful or itching.   Low abd pain on right side. Started a few months ago. Hurts when straining or bending over.    Review of Systems  Constitutional: Positive for fever.  HENT: Positive for sore throat.   Respiratory: Positive for cough and wheezing.   Neurological: Positive for headaches.       Objective:   Physical Exam  Scattered wheezes chest congestion noted not respiratory distress O2 sat good heart regular HEENT is benign  No hernia detected in the right lower abdomen. If this area persists he needs a follow-up. No tenderness on abdominal exam. No masses felt.    Assessment & Plan:  Viral syndrome Secondary rhinosinusitis Bronchitis Reactive airway Responded fairly well to albuterol No need for x-rays or lab work If high fever chills or worse follow-up Area of tenderness is in the right groin region but I do not find any evidence of hernia or mass or tenderness

## 2016-02-08 ENCOUNTER — Encounter (HOSPITAL_COMMUNITY): Payer: Self-pay

## 2016-02-08 ENCOUNTER — Encounter (HOSPITAL_BASED_OUTPATIENT_CLINIC_OR_DEPARTMENT_OTHER): Payer: Medicare Other

## 2016-02-08 VITALS — BP 139/87 | HR 63 | Temp 97.8°F | Resp 20 | Wt 193.2 lb

## 2016-02-08 DIAGNOSIS — D801 Nonfamilial hypogammaglobulinemia: Secondary | ICD-10-CM

## 2016-02-08 MED ORDER — ACETAMINOPHEN 325 MG PO TABS
ORAL_TABLET | ORAL | Status: AC
  Filled 2016-02-08: qty 2

## 2016-02-08 MED ORDER — IMMUNE GLOBULIN (HUMAN) 10 GM/100ML IV SOLN: 0.4000 g/kg | Freq: Once | INTRAVENOUS | Status: DC

## 2016-02-08 MED ORDER — SODIUM CHLORIDE 0.9 % IV SOLN
Freq: Once | INTRAVENOUS | Status: AC
  Administered 2016-02-08: 11:00:00 via INTRAVENOUS

## 2016-02-08 MED ORDER — ACETAMINOPHEN 325 MG PO TABS
650.0000 mg | ORAL_TABLET | Freq: Four times a day (QID) | ORAL | Status: DC | PRN
  Administered 2016-02-08: 650 mg via ORAL

## 2016-02-08 MED ORDER — IMMUNE GLOBULIN (HUMAN) 10 GM/100ML IV SOLN
0.4000 g/kg | Freq: Once | INTRAVENOUS | Status: DC
Start: 2016-02-08 — End: 2016-02-08

## 2016-02-08 MED ORDER — IMMUNE GLOBULIN (HUMAN) 5 GM/50ML IV SOLN
400.0000 mg/kg | Freq: Once | INTRAVENOUS | Status: AC
  Administered 2016-02-08: 35 g via INTRAVENOUS
  Filled 2016-02-08: qty 50

## 2016-02-08 MED ORDER — DIPHENHYDRAMINE HCL 25 MG PO CAPS
ORAL_CAPSULE | ORAL | Status: AC
  Filled 2016-02-08: qty 2

## 2016-02-08 MED ORDER — SODIUM CHLORIDE 0.9 % IJ SOLN: 10.0000 mL | INTRAMUSCULAR | Status: DC | PRN

## 2016-02-08 MED ORDER — HEPARIN SOD (PORK) LOCK FLUSH 100 UNIT/ML IV SOLN
500.0000 [IU] | Freq: Once | INTRAVENOUS | Status: AC | PRN
  Administered 2016-02-08: 500 [IU]
  Filled 2016-02-08: qty 5

## 2016-02-08 NOTE — Progress Notes (Signed)
Tolerated infusion well. Ambulatory on discharge home to self. 

## 2016-02-08 NOTE — Patient Instructions (Signed)
[place] at [facility] Discharge Instructions  RECOMMENDATIONS MADE BY THE CONSULTANT AND ANY TEST RESULTS WILL BE SENT TO YOUR REFERRING PHYSICIAN.  IVIG today  Thank you for choosing [place] at [facility] to provide your oncology and hematology care.  To afford each patient quality time with our provider, please arrive at least 15 minutes before your scheduled appointment time.   Beginning January 23rd 2017 lab work for the [name] will be done in the  Main lab at [facility] on 1st floor. If you have a lab appointment with the [facility] please come in thru the  Main Entrance and check in at the main information desk  You need to re-schedule your appointment should you arrive 10 or more minutes late.  We strive to give you quality time with our providers, and arriving late affects you and other patients whose appointments are after yours.  Also, if you no show three or more times for appointments you may be dismissed from the clinic at the providers discretion.     Again, thank you for choosing [facility].  Our hope is that these requests will decrease the amount of time that you wait before being seen by our physicians.       _____________________________________________________________  Should you have questions after your visit to [facility], please contact our office at [phone] between the hours of 8:30 a.m. and 4:30 p.m.  Voicemails left after 4:30 p.m. will not be returned until the following business day.  For prescription refill requests, have your pharmacy contact our office.         Resources For Cancer Patients and their Caregivers ? American Cancer Society: Can assist with transportation, wigs, general needs, runs Look Good Feel Better.        820-173-8979 ? Cancer Care: Provides financial assistance, online support groups, medication/co-pay assistance.  1-800-813-HOPE  917-419-2884) ? [place] Assists [place] cancer patients and their families through emotional , educational and financial support.  4341099268 ? [place] DSS Where to apply for food stamps, Medicaid and utility assistance. (848) 361-4422 ? RCATS: Transportation to medical appointments. 331-634-6228 ? Social Security Administration: May apply for disability if have a Stage IV cancer. 207-149-1324 [phone] ? [employer], Disability and Transit Services: Assists with nutrition, care and transit needs. [phone]

## 2016-02-19 ENCOUNTER — Other Ambulatory Visit: Payer: Self-pay | Admitting: Family Medicine

## 2016-02-19 DIAGNOSIS — C61 Malignant neoplasm of prostate: Secondary | ICD-10-CM | POA: Diagnosis not present

## 2016-02-21 DIAGNOSIS — B350 Tinea barbae and tinea capitis: Secondary | ICD-10-CM | POA: Diagnosis not present

## 2016-02-21 DIAGNOSIS — Z85828 Personal history of other malignant neoplasm of skin: Secondary | ICD-10-CM | POA: Diagnosis not present

## 2016-02-21 DIAGNOSIS — X32XXXD Exposure to sunlight, subsequent encounter: Secondary | ICD-10-CM | POA: Diagnosis not present

## 2016-02-21 DIAGNOSIS — L570 Actinic keratosis: Secondary | ICD-10-CM | POA: Diagnosis not present

## 2016-02-21 DIAGNOSIS — Z08 Encounter for follow-up examination after completed treatment for malignant neoplasm: Secondary | ICD-10-CM | POA: Diagnosis not present

## 2016-02-21 DIAGNOSIS — L308 Other specified dermatitis: Secondary | ICD-10-CM | POA: Diagnosis not present

## 2016-02-22 DIAGNOSIS — N320 Bladder-neck obstruction: Secondary | ICD-10-CM | POA: Diagnosis not present

## 2016-02-22 DIAGNOSIS — Z0000 Encounter for general adult medical examination without abnormal findings: Secondary | ICD-10-CM | POA: Diagnosis not present

## 2016-02-22 DIAGNOSIS — N281 Cyst of kidney, acquired: Secondary | ICD-10-CM | POA: Diagnosis not present

## 2016-02-22 DIAGNOSIS — N5201 Erectile dysfunction due to arterial insufficiency: Secondary | ICD-10-CM | POA: Diagnosis not present

## 2016-02-22 DIAGNOSIS — C61 Malignant neoplasm of prostate: Secondary | ICD-10-CM | POA: Diagnosis not present

## 2016-02-23 ENCOUNTER — Other Ambulatory Visit: Payer: Self-pay | Admitting: Family Medicine

## 2016-02-29 ENCOUNTER — Other Ambulatory Visit (HOSPITAL_COMMUNITY): Payer: Self-pay | Admitting: Oncology

## 2016-02-29 DIAGNOSIS — C9001 Multiple myeloma in remission: Secondary | ICD-10-CM

## 2016-02-29 MED ORDER — LENALIDOMIDE 10 MG PO CAPS: 10.0000 mg | ORAL_CAPSULE | Freq: Every day | ORAL | Status: DC

## 2016-03-04 ENCOUNTER — Encounter (HOSPITAL_COMMUNITY): Payer: Medicare Other | Attending: Oncology

## 2016-03-04 DIAGNOSIS — Z8572 Personal history of non-Hodgkin lymphomas: Secondary | ICD-10-CM

## 2016-03-04 DIAGNOSIS — C9001 Multiple myeloma in remission: Secondary | ICD-10-CM | POA: Diagnosis not present

## 2016-03-04 DIAGNOSIS — C9000 Multiple myeloma not having achieved remission: Secondary | ICD-10-CM | POA: Insufficient documentation

## 2016-03-04 LAB — CBC WITH DIFFERENTIAL/PLATELET
BASOS ABS: 0.1 10*3/uL (ref 0.0–0.1)
Basophils Relative: 2 %
Eosinophils Absolute: 0.2 10*3/uL (ref 0.0–0.7)
Eosinophils Relative: 4 %
HEMATOCRIT: 36.3 % — AB (ref 39.0–52.0)
Hemoglobin: 12.2 g/dL — ABNORMAL LOW (ref 13.0–17.0)
LYMPHS PCT: 41 %
Lymphs Abs: 1.6 10*3/uL (ref 0.7–4.0)
MCH: 29.3 pg (ref 26.0–34.0)
MCHC: 33.6 g/dL (ref 30.0–36.0)
MCV: 87.3 fL (ref 78.0–100.0)
MONO ABS: 0.4 10*3/uL (ref 0.1–1.0)
MONOS PCT: 9 %
Neutro Abs: 1.8 10*3/uL (ref 1.7–7.7)
Neutrophils Relative %: 45 %
PLATELETS: 114 10*3/uL — AB (ref 150–400)
RBC: 4.16 MIL/uL — ABNORMAL LOW (ref 4.22–5.81)
RDW: 15 % (ref 11.5–15.5)
WBC: 3.9 10*3/uL — ABNORMAL LOW (ref 4.0–10.5)

## 2016-03-05 LAB — IGG, IGA, IGM
IGG (IMMUNOGLOBIN G), SERUM: 942 mg/dL (ref 700–1600)
IgA: 337 mg/dL (ref 61–437)
IgM, Serum: 26 mg/dL (ref 15–143)

## 2016-03-05 LAB — KAPPA/LAMBDA LIGHT CHAINS
KAPPA, LAMDA LIGHT CHAIN RATIO: 1.39 (ref 0.26–1.65)
Kappa free light chain: 32.4 mg/L — ABNORMAL HIGH (ref 3.30–19.40)
LAMDA FREE LIGHT CHAINS: 23.35 mg/L (ref 5.71–26.30)

## 2016-03-06 LAB — PROTEIN ELECTROPHORESIS, SERUM
A/G Ratio: 1.3 (ref 0.7–1.7)
ALPHA-1-GLOBULIN: 0.2 g/dL (ref 0.0–0.4)
Albumin ELP: 3.5 g/dL (ref 2.9–4.4)
Alpha-2-Globulin: 0.6 g/dL (ref 0.4–1.0)
BETA GLOBULIN: 1 g/dL (ref 0.7–1.3)
GAMMA GLOBULIN: 0.9 g/dL (ref 0.4–1.8)
Globulin, Total: 2.7 g/dL (ref 2.2–3.9)
TOTAL PROTEIN ELP: 6.2 g/dL (ref 6.0–8.5)

## 2016-03-06 LAB — IMMUNOFIXATION ELECTROPHORESIS
IGA: 286 mg/dL (ref 61–437)
IgG (Immunoglobin G), Serum: 839 mg/dL (ref 700–1600)
IgM, Serum: 24 mg/dL (ref 15–143)
Total Protein ELP: 6.1 g/dL (ref 6.0–8.5)

## 2016-03-07 ENCOUNTER — Encounter (HOSPITAL_BASED_OUTPATIENT_CLINIC_OR_DEPARTMENT_OTHER): Payer: Medicare Other

## 2016-03-07 ENCOUNTER — Encounter (INDEPENDENT_AMBULATORY_CARE_PROVIDER_SITE_OTHER): Payer: Self-pay | Admitting: *Deleted

## 2016-03-07 VITALS — BP 136/68 | HR 60 | Temp 97.7°F | Resp 18 | Wt 189.8 lb

## 2016-03-07 DIAGNOSIS — D801 Nonfamilial hypogammaglobulinemia: Secondary | ICD-10-CM

## 2016-03-07 MED ORDER — HEPARIN SOD (PORK) LOCK FLUSH 100 UNIT/ML IV SOLN
500.0000 [IU] | Freq: Once | INTRAVENOUS | Status: AC | PRN
  Administered 2016-03-07: 500 [IU]
  Filled 2016-03-07: qty 5

## 2016-03-07 MED ORDER — SODIUM CHLORIDE 0.9 % IJ SOLN: 10.0000 mL | INTRAMUSCULAR | Status: DC | PRN

## 2016-03-07 MED ORDER — IMMUNE GLOBULIN (HUMAN) 5 GM/50ML IV SOLN
400.0000 mg/kg | Freq: Once | INTRAVENOUS | Status: AC
  Administered 2016-03-07: 35 g via INTRAVENOUS
  Filled 2016-03-07: qty 50

## 2016-03-07 MED ORDER — SODIUM CHLORIDE 0.9 % IV SOLN
Freq: Once | INTRAVENOUS | Status: AC
  Administered 2016-03-07: 11:00:00 via INTRAVENOUS

## 2016-03-07 MED ORDER — ACETAMINOPHEN 325 MG PO TABS
650.0000 mg | ORAL_TABLET | Freq: Four times a day (QID) | ORAL | Status: DC | PRN
  Administered 2016-03-07: 650 mg via ORAL
  Filled 2016-03-07: qty 2

## 2016-03-07 MED ORDER — IMMUNE GLOBULIN (HUMAN) 10 GM/100ML IV SOLN: 0.4000 g/kg | Freq: Once | INTRAVENOUS | Status: DC

## 2016-03-07 NOTE — Patient Instructions (Signed)
[place] at [facility] Discharge Instructions  RECOMMENDATIONS MADE BY THE CONSULTANT AND ANY TEST RESULTS WILL BE SENT TO YOUR REFERRING PHYSICIAN.  IVIG infusion given today as ordered. Return as scheduled.  Thank you for choosing [place] at [facility] to provide your oncology and hematology care.  To afford each patient quality time with our provider, please arrive at least 15 minutes before your scheduled appointment time.   Beginning January 23rd 2017 lab work for the Jeremy Parrish will be done in the  Main lab at [facility] on 1st floor. If you have a lab appointment with the [facility] please come in thru the  Main Entrance and check in at the main information desk  You need to re-schedule your appointment should you arrive 10 or more minutes late.  We strive to give you quality time with our providers, and arriving late affects you and other patients whose appointments are after yours.  Also, if you no show three or more times for appointments you may be dismissed from the clinic at the providers discretion.     Again, thank you for choosing [facility].  Our hope is that these requests will decrease the amount of time that you wait before being seen by our physicians.       _____________________________________________________________  Should you have questions after your visit to [facility], please contact our office at [phone] between the hours of 8:30 a.m. and 4:30 p.m.  Voicemails left after 4:30 p.m. will not be returned until the following business day.  For prescription refill requests, have your pharmacy contact our office.         Resources For Cancer Patients and their Caregivers ? American Cancer Society: Can assist with transportation, wigs, general needs, runs Look Good Feel Better.        713-399-0260 ? Cancer Care: Provides financial assistance, online support groups,  medication/co-pay assistance.  1-800-813-HOPE (925) 454-3396) ? [place] Assists [place] cancer patients and their families through emotional , educational and financial support.  8203221075 ? [place] DSS Where to apply for food stamps, Medicaid and utility assistance. 7262139941 ? RCATS: Transportation to medical appointments. (828) 340-0843 ? Social Security Administration: May apply for disability if have a Stage IV cancer. 225-788-0715 [phone] ? [employer], Disability and Transit Services: Assists with nutrition, care and transit needs. [phone]

## 2016-03-07 NOTE — Progress Notes (Signed)
Tolerated IVIG infusion well. Ambulatory on discharge home to self.

## 2016-03-08 ENCOUNTER — Other Ambulatory Visit (HOSPITAL_COMMUNITY): Payer: Self-pay | Admitting: Oncology

## 2016-03-08 DIAGNOSIS — M25511 Pain in right shoulder: Secondary | ICD-10-CM

## 2016-03-08 MED ORDER — HYDROCODONE-ACETAMINOPHEN 5-325 MG PO TABS: 1.0000 | ORAL_TABLET | Freq: Two times a day (BID) | ORAL | Status: DC | PRN

## 2016-03-19 ENCOUNTER — Encounter: Payer: Self-pay | Admitting: Family Medicine

## 2016-03-19 ENCOUNTER — Ambulatory Visit (HOSPITAL_COMMUNITY)
Admission: RE | Admit: 2016-03-19 | Discharge: 2016-03-19 | Disposition: A | Discharge status: Home / Self Care | Payer: Medicare Other | Source: Ambulatory Visit | Attending: Family Medicine | Admitting: Family Medicine

## 2016-03-19 ENCOUNTER — Ambulatory Visit (INDEPENDENT_AMBULATORY_CARE_PROVIDER_SITE_OTHER): Payer: Medicare Other | Admitting: Family Medicine

## 2016-03-19 VITALS — BP 130/74 | Temp 98.3°F | Ht 68.0 in | Wt 187.1 lb

## 2016-03-19 DIAGNOSIS — R05 Cough: Secondary | ICD-10-CM

## 2016-03-19 DIAGNOSIS — R059 Cough, unspecified: Secondary | ICD-10-CM

## 2016-03-19 DIAGNOSIS — I2510 Atherosclerotic heart disease of native coronary artery without angina pectoris: Secondary | ICD-10-CM | POA: Diagnosis not present

## 2016-03-19 DIAGNOSIS — R062 Wheezing: Secondary | ICD-10-CM

## 2016-03-19 DIAGNOSIS — J449 Chronic obstructive pulmonary disease, unspecified: Secondary | ICD-10-CM | POA: Insufficient documentation

## 2016-03-19 DIAGNOSIS — J019 Acute sinusitis, unspecified: Secondary | ICD-10-CM | POA: Diagnosis not present

## 2016-03-19 MED ORDER — PREDNISONE 20 MG PO TABS: ORAL_TABLET | ORAL | Status: DC

## 2016-03-19 MED ORDER — DOXYCYCLINE HYCLATE 100 MG PO CAPS: 100.0000 mg | ORAL_CAPSULE | Freq: Two times a day (BID) | ORAL | Status: DC

## 2016-03-19 NOTE — Patient Instructions (Signed)
Take the doxycycline, one 2 times a day for 10 days with a glass of water and a snack  Use the inhaler  Get the chest xray

## 2016-03-19 NOTE — Progress Notes (Signed)
   Subjective:    Patient ID: Jeremy Parrish, male    DOB: 1941-05-26, 75 y.o.   MRN: [id number]  Cough This is a new problem. The current episode started in the past 7 days. The cough is productive of sputum. Associated symptoms include wheezing. Associated symptoms comments: Diarrhea. Treatments tried: Alka Seltzer Plus.  patients had about 5 days of head congestion drainage coughing some chest congestion also some wheezing denies high fever chills nausea vomiting diarrhea.  Patient states no other concerns this visit.  Review of Systems  Respiratory: Positive for cough and wheezing.        Objective:   Physical Exam  Bilateral wheezing noted some chest congestion noted not rest or distress HEENT benign      Assessment & Plan:  Sinusitis possible secondary bronchitis antibiotics prednisone taper albuterol as directed x-rays indicated. Await the results.

## 2016-03-25 ENCOUNTER — Other Ambulatory Visit: Payer: Self-pay | Admitting: *Deleted

## 2016-03-25 ENCOUNTER — Telehealth: Payer: Self-pay | Admitting: Family Medicine

## 2016-03-25 MED ORDER — ALBUTEROL SULFATE HFA 108 (90 BASE) MCG/ACT IN AERS: 2.0000 | INHALATION_SPRAY | Freq: Four times a day (QID) | RESPIRATORY_TRACT | Status: DC | PRN

## 2016-03-25 NOTE — Telephone Encounter (Signed)
Med sent to pharm. Pt notified on voicemail.  

## 2016-03-25 NOTE — Telephone Encounter (Signed)
Pt is needing a refill on his albuterol (PROVENTIL HFA;VENTOLIN HFA) 108 (90 BASE) MCG/ACT inhaler.      [facility] [place]

## 2016-03-25 NOTE — Telephone Encounter (Signed)
Seen 03/19/16 for wheezing. Albuterol inhaler was last prescribed by Dr. Alvina Filbert on 11/04/2014

## 2016-03-25 NOTE — Telephone Encounter (Signed)
Ok plus three ref 

## 2016-03-26 ENCOUNTER — Encounter (INDEPENDENT_AMBULATORY_CARE_PROVIDER_SITE_OTHER): Payer: Self-pay | Admitting: Internal Medicine

## 2016-03-26 ENCOUNTER — Ambulatory Visit (INDEPENDENT_AMBULATORY_CARE_PROVIDER_SITE_OTHER): Payer: Medicare Other | Admitting: Internal Medicine

## 2016-03-26 VITALS — BP 108/50 | HR 64 | Temp 98.3°F | Ht 68.0 in | Wt 189.5 lb

## 2016-03-26 DIAGNOSIS — R197 Diarrhea, unspecified: Secondary | ICD-10-CM

## 2016-03-26 DIAGNOSIS — I2510 Atherosclerotic heart disease of native coronary artery without angina pectoris: Secondary | ICD-10-CM | POA: Diagnosis not present

## 2016-03-26 NOTE — Patient Instructions (Signed)
Imodium twice a day. Fiber 4 gms daily Fiber bars. GI pathogen

## 2016-03-26 NOTE — Progress Notes (Addendum)
Subjective:    Patient ID: Jeremy Parrish, male    DOB: 03/31/1941, 75 y.o.   MRN: [id number]  HPI Presents today with c/o diarrhea. He had diarrhea for years. Had been taking Imodium. He has to take 2-3 Imodium daily.  He has pain in his rectum. He is having 3-5 BMs a day.  Wife states 10 inches of his colon was removed due to diverticulitis. Takes IVIG  once a month to boost his immune system for multiple Myeloma.  Appetite is good. No weight loss.  Presently taking Amoxicillin for chest congeston. 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 Marcell Anger. 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 In 2011 he was diagnosed with IgG lambda multiple myeloma and treated with 4 cycles of Velcade and dexamethasone followed by autologous peripheral blood stem cell transplant and currently remains on maintenance Revlimid and    takes seven days on and 7 days off for his myeloma.   01/01/2013 Colonoscopy with snare polypectomy.  Indications: Patient is 76 year old Caucasian male with multiple medical problems who presents with chronic diarrhea. He has history of complicated sigmoid diverticulitis for which he had surgery in December 2007.  Impression:  Examination performed to cecum. Three small polyps snared from cecum and submitted together. Two small polyps were coagulated as above. Single diverticulum at cecum with few more at descending colon. Open colonic anastomosis at 20 cm from the anal margin. No evidence of endoscopic colitis. Review of Systems Past Medical History  Diagnosis Date  . Hypertension   . Heart disease   . Kidney stones     history  . Lung mass   . Heart murmur   . 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 Coronary artery disease   . Depression   . Shortness of breath   . Peripheral arterial disease ([place])   . Bladder neck contracture   . Personal history of other diseases of circulatory system   . Aortic aneurysm of unspecified site without mention of rupture   . Arthritis   . Intestinovesical fistula   . Esophageal reflux   . Hyperlipidemia   . Anemia   . CHF (congestive heart failure) ([place])   . COPD (chronic obstructive pulmonary disease) ([place])   . Cerebral atherosclerosis     Carotid Doppler, 02/16/2013 - Bilateral Proximal ICAs,demonstrate mild plaque w/o evidence of significant diameter reduction, dissection, or any other vascular abnormality  . Complication of anesthesia   . PONV (postoperative nausea and vomiting)   . Stroke [facility]) 2013    Speech.  . Hx of bladder cancer 10/07/2012  . Sleep apnea     05-02-14 cpap , not yet used- suggested settings 5  . Cancer ([place])   . Prostate cancer ([place]) 2000  . Multiple myeloma   . Non Hodgkin's lymphoma ([place])   . Myocardial infarction [facility])     '96  . Shingles   . Paroxysmal atrial fibrillation ([place]) 01/02/2016    Past Surgical History  Procedure Laterality Date  . Prostate surgery    . Heart stents x 5  1999  . Portacath placement  07/26/2009    right  chest  . Wrist surgery      right  . Left ear skin cancer removed    . Bone marrow transplant  2011  . Pacemaker insertion  07/22/2011    [employer]  . Coronary angioplasty  06/24/2000    PCI and stenting in mid & proximal RCA  . Insert / replace / remove pacemaker    . Tee without cardioversion  10/13/2012    Procedure: TRANSESOPHAGEAL ECHOCARDIOGRAM (TEE);  Surgeon: Sanda Klein, MD;  Location: [facility] ENDOSCOPY;  Service: Cardiovascular;  Laterality: N/A;  pat/kay/echo notified  . Colonoscopy N/A 01/01/2013    Procedure: COLONOSCOPY;  Surgeon: Rogene Houston, MD;  Location: [facility] ENDO SUITE;   Service: Endoscopy;  Laterality: N/A;  825-moved to [place] notified pt  . Rotator    . Rotator cuff repair Right   . Colon surgery      colon resection  . Bladder surgery    . Shoulder arthroscopy with subacromial decompression Right 07/21/2013    Procedure: RIGHT SHOULDER ARTHROSCOPY WITH SUBACROMIAL DECOMPRESSION AND DEBRIDEMENT & Injection of Left Shoulder;  Surgeon: Alta Corning, MD;  Location: [place];  Service: Orthopedics;  Laterality: Right;  . US echocardiography  06/19/2011    RV mildly dilated,mild to mod. MR,mild AI,mild PI  . Nm myocar perf wall motion  11/27/2007    inferior scar  . Inguinal hernia repair Right 05/04/2014    Procedure: OPEN RIGHT INGUINAL HERNIA REPAIR with mesh;  Surgeon: Edward Jolly, MD;  Location: [facility] ORS;  Service: General;  Laterality: Right;    Allergies  Allergen Reactions  . Morphine And Related Other (See Comments)    hallucinations  . Tape Rash    Paper tape is ok    Current Outpatient Prescriptions on File Prior to Visit  Medication Sig Dispense Refill  . albuterol (PROVENTIL HFA;VENTOLIN HFA) 108 (90 Base) MCG/ACT inhaler Inhale 2 puffs into the lungs every 6 (six) hours as needed for wheezing or shortness of breath. 1 Inhaler 3  . ALPRAZolam (XANAX) 0.5 MG tablet Take 1-2 tablets (0.5-1 mg total) by mouth at bedtime as needed for anxiety. 90 tablet 1  . amLODipine (NORVASC) 10 MG tablet TAKE 1 TABLET (10 MG TOTAL) BY MOUTH EVERY MORNING. 90 tablet 1  . apixaban (ELIQUIS) 5 MG TABS tablet Take 1 tablet (5 mg total) by mouth 2 (two) times daily. 60 tablet 5  . chlorzoxazone (PARAFON) 500 MG tablet Take 1 tablet (500 mg total) by mouth 3 (three) times daily as needed for muscle spasms. 60 tablet 2  . citalopram (CELEXA) 40 MG tablet TAKE 1 TABLET (40 MG TOTAL) BY MOUTH DAILY. 90 tablet 0  . clopidogrel (PLAVIX) 75 MG tablet TAKE 1 TABLET BY MOUTH EVERY DAY 90 tablet 3  . HYDROcodone-acetaminophen (NORCO/VICODIN) 5-325 MG tablet Take 1 tablet  by mouth 2 (two) times daily as needed. 60 tablet 0  . KLOR-CON M20 20 MEQ tablet Take 20 mEq by mouth 2 (two) times daily. Reported on 11/15/2015    . nitroGLYCERIN (NITROSTAT) 0.4 MG SL tablet Place 1 tablet (0.4 mg total) under the tongue every 5 (five) minutes as needed for chest pain. 25 tablet 0  . omeprazole (PRILOSEC) 20 MG capsule TAKE 1 CAPSULE (20 MG TOTAL) BY MOUTH DAILY. 90 capsule 1  . simvastatin (ZOCOR) 20 MG tablet Take 1 tablet (20 mg total) by mouth at bedtime. 90 tablet 3   Current Facility-Administered Medications on File Prior to Visit  Medication Dose Route  Frequency Provider Last Rate Last Dose  . acetaminophen (TYLENOL) tablet 650 mg  650 mg Oral Q6H PRN Baird Cancer, PA-C   650 mg at 12/09/14 0855  . sodium chloride 0.9 % injection 10 mL  10 mL Intracatheter PRN Baird Cancer, PA-C   10 mL at 12/09/14 0856        Objective:   Physical ExamBlood pressure 108/50, pulse 64, temperature 98.3 F (36.8 C), height _0  (1.727 m), weight 189 lb 8 oz (85.957 kg). Alert and oriented. Skin warm and dry. Oral mucosa is moist.   . Sclera anicteric, conjunctivae is pink. Thyroid not enlarged. No cervical lymphadenopathy. Bilateral wheezes. Heart regular rate and rhythm.  Abdomen is soft. Bowel sounds are positive. No hepatomegaly. No abdominal masses felt. No tenderness.  No edema to lower extremities.          Assessment & Plan:  Chronic diarrhea. Needs to take Imodium BID. Fiber tabs 4 gms daily. Stool studies.

## 2016-03-29 LAB — GASTROINTESTINAL PATHOGEN PANEL PCR
C. difficile Tox A/B, PCR: NOT DETECTED
CRYPTOSPORIDIUM, PCR: NOT DETECTED
Campylobacter, PCR: NOT DETECTED
E COLI (ETEC) LT/ST, PCR: NOT DETECTED
E COLI 0157, PCR: NOT DETECTED
E coli (STEC) stx1/stx2, PCR: NOT DETECTED
Giardia lamblia, PCR: NOT DETECTED
Norovirus, PCR: NOT DETECTED
Rotavirus A, PCR: NOT DETECTED
SALMONELLA, PCR: NOT DETECTED
Shigella, PCR: NOT DETECTED

## 2016-04-01 ENCOUNTER — Other Ambulatory Visit (HOSPITAL_COMMUNITY): Payer: Self-pay | Admitting: Oncology

## 2016-04-01 ENCOUNTER — Telehealth: Payer: Self-pay | Admitting: Cardiovascular Disease

## 2016-04-01 ENCOUNTER — Ambulatory Visit (INDEPENDENT_AMBULATORY_CARE_PROVIDER_SITE_OTHER): Payer: Medicare Other | Admitting: *Deleted

## 2016-04-01 DIAGNOSIS — R001 Bradycardia, unspecified: Secondary | ICD-10-CM

## 2016-04-01 DIAGNOSIS — C9001 Multiple myeloma in remission: Secondary | ICD-10-CM

## 2016-04-01 MED ORDER — LENALIDOMIDE 10 MG PO CAPS: 10.0000 mg | ORAL_CAPSULE | Freq: Every day | ORAL | Status: DC

## 2016-04-01 NOTE — Telephone Encounter (Signed)
Returned patient's call.  Advised that his transmission was received and that his device function is stable.  No alerts or abnormalities noted.  Advised patient that the next transmission date will be 07/01/16.  He verbalizes understanding and appreciation.  He denies additional questions or concerns at this time.

## 2016-04-01 NOTE — Telephone Encounter (Signed)
New message       1. Has your device fired? no  2. Is you device beeping? no  3. Are you experiencing draining or swelling at device site? no  4. Are you calling to see if we received your device transmission? no  5. Have you passed out? No   Per pt the device will download at times just started having problems with it today.

## 2016-04-01 NOTE — Telephone Encounter (Signed)
PM related inquiry sent to device pool.

## 2016-04-03 ENCOUNTER — Encounter (INDEPENDENT_AMBULATORY_CARE_PROVIDER_SITE_OTHER): Payer: Self-pay | Admitting: Internal Medicine

## 2016-04-03 NOTE — Progress Notes (Signed)
Remote pacemaker transmission.   

## 2016-04-03 NOTE — Progress Notes (Signed)
Patient was given an appointment for 05/28/16 at 3:45pm with Deberah Castle, NP

## 2016-04-04 ENCOUNTER — Encounter (HOSPITAL_COMMUNITY): Payer: Medicare Other | Attending: Oncology

## 2016-04-04 VITALS — BP 148/76 | HR 65 | Temp 97.7°F | Resp 18 | Wt 189.9 lb

## 2016-04-04 DIAGNOSIS — D801 Nonfamilial hypogammaglobulinemia: Secondary | ICD-10-CM | POA: Diagnosis present

## 2016-04-04 DIAGNOSIS — C9001 Multiple myeloma in remission: Secondary | ICD-10-CM | POA: Insufficient documentation

## 2016-04-04 DIAGNOSIS — C9000 Multiple myeloma not having achieved remission: Secondary | ICD-10-CM | POA: Insufficient documentation

## 2016-04-04 MED ORDER — HEPARIN SOD (PORK) LOCK FLUSH 100 UNIT/ML IV SOLN
500.0000 [IU] | Freq: Once | INTRAVENOUS | Status: AC
  Administered 2016-04-04: 500 [IU] via INTRAVENOUS

## 2016-04-04 MED ORDER — SODIUM CHLORIDE 0.9% FLUSH: 10.0000 mL | INTRAVENOUS | Status: DC | PRN

## 2016-04-04 MED ORDER — ACETAMINOPHEN 325 MG PO TABS
ORAL_TABLET | ORAL | Status: AC
  Filled 2016-04-04: qty 2

## 2016-04-04 MED ORDER — IMMUNE GLOBULIN (HUMAN) 5 GM/50ML IV SOLN
400.0000 mg/kg | Freq: Once | INTRAVENOUS | Status: AC
  Administered 2016-04-04: 35 g via INTRAVENOUS
  Filled 2016-04-04: qty 50

## 2016-04-04 MED ORDER — IMMUNE GLOBULIN (HUMAN) 10 GM/100ML IV SOLN
0.4000 g/kg | Freq: Once | INTRAVENOUS | Status: DC
  Filled 2016-04-04: qty 350

## 2016-04-04 MED ORDER — SODIUM CHLORIDE 0.9 % IV SOLN
Freq: Once | INTRAVENOUS | Status: AC
  Administered 2016-04-04: 12:00:00 via INTRAVENOUS

## 2016-04-04 MED ORDER — ACETAMINOPHEN 325 MG PO TABS
650.0000 mg | ORAL_TABLET | Freq: Four times a day (QID) | ORAL | Status: DC | PRN
  Administered 2016-04-04: 650 mg via ORAL

## 2016-04-04 NOTE — Patient Instructions (Signed)
[place] at [facility] Discharge Instructions  RECOMMENDATIONS MADE BY THE CONSULTANT AND ANY TEST RESULTS WILL BE SENT TO YOUR REFERRING PHYSICIAN.  IVIG today.    Thank you for choosing [place] at [facility] to provide your oncology and hematology care.  To afford each patient quality time with our provider, please arrive at least 15 minutes before your scheduled appointment time.   Beginning January 23rd 2017 lab work for the [name] will be done in the  Main lab at [facility] on 1st floor. If you have a lab appointment with the [facility] please come in thru the  Main Entrance and check in at the main information desk  You need to re-schedule your appointment should you arrive 10 or more minutes late.  We strive to give you quality time with our providers, and arriving late affects you and other patients whose appointments are after yours.  Also, if you no show three or more times for appointments you may be dismissed from the clinic at the providers discretion.     Again, thank you for choosing [facility].  Our hope is that these requests will decrease the amount of time that you wait before being seen by our physicians.       _____________________________________________________________  Should you have questions after your visit to [facility], please contact our office at [phone] between the hours of 8:30 a.m. and 4:30 p.m.  Voicemails left after 4:30 p.m. will not be returned until the following business day.  For prescription refill requests, have your pharmacy contact our office.         Resources For Cancer Patients and their Caregivers ? American Cancer Society: Can assist with transportation, wigs, general needs, runs Look Good Feel Better.        7602559319 ? Cancer Care: Provides financial assistance, online support groups, medication/co-pay assistance.  1-800-813-HOPE  978-144-0160) ? [place] Assists [place] cancer patients and their families through emotional , educational and financial support.  8050940946 ? [place] DSS Where to apply for food stamps, Medicaid and utility assistance. 404-789-8980 ? RCATS: Transportation to medical appointments. 4325014180 ? Social Security Administration: May apply for disability if have a Stage IV cancer. [phone] ? [employer], Disability and Transit Services: Assists with nutrition, care and transit needs. [place] Support Programs: @10RELATIVEDAYS @ > Cancer Support Group  2nd Tuesday of the month 1pm-2pm, Journey Room  > Creative Journey  3rd Tuesday of the month 1130am-1pm, Journey Room  > Look Good Feel Better  1st Wednesday of the month 10am-12 noon, Journey Room (Call [facility] to register [phone])

## 2016-04-04 NOTE — Progress Notes (Signed)
Patient tolerated infusion well.  VSS.   

## 2016-04-08 LAB — CUP PACEART INCLINIC DEVICE CHECK
Battery Voltage: 2.99 V
Brady Statistic AP VP Percent: 1.36 %
Brady Statistic RA Percent Paced: 94.51 %
Brady Statistic RV Percent Paced: 1.4 %
Date Time Interrogation Session: 20170221150024
Implantable Lead Location: 753859
Lead Channel Setting Pacing Amplitude: 2 V
Lead Channel Setting Pacing Pulse Width: 0.6 ms
Lead Channel Setting Sensing Sensitivity: 0.9 mV
MDC IDC LEAD IMPLANT DT: 20120910
MDC IDC LEAD IMPLANT DT: 20120910
MDC IDC LEAD LOCATION: 753860
MDC IDC LEAD MODEL: 5086
MDC IDC LEAD MODEL: 5086
MDC IDC MSMT LEADCHNL RA IMPEDANCE VALUE: 408 Ohm
MDC IDC MSMT LEADCHNL RA SENSING INTR AMPL: 2.04 mV
MDC IDC MSMT LEADCHNL RV IMPEDANCE VALUE: 416 Ohm
MDC IDC MSMT LEADCHNL RV SENSING INTR AMPL: 6.396 mV
MDC IDC SET LEADCHNL RV PACING AMPLITUDE: 2.5 V
MDC IDC STAT BRADY AP VS PERCENT: 93.15 %
MDC IDC STAT BRADY AS VP PERCENT: 0.04 %
MDC IDC STAT BRADY AS VS PERCENT: 5.46 %

## 2016-04-10 DIAGNOSIS — Z85828 Personal history of other malignant neoplasm of skin: Secondary | ICD-10-CM | POA: Diagnosis not present

## 2016-04-10 DIAGNOSIS — L570 Actinic keratosis: Secondary | ICD-10-CM | POA: Diagnosis not present

## 2016-04-10 DIAGNOSIS — L812 Freckles: Secondary | ICD-10-CM | POA: Diagnosis not present

## 2016-04-10 DIAGNOSIS — D485 Neoplasm of uncertain behavior of skin: Secondary | ICD-10-CM | POA: Diagnosis not present

## 2016-04-10 DIAGNOSIS — B009 Herpesviral infection, unspecified: Secondary | ICD-10-CM | POA: Diagnosis not present

## 2016-04-10 DIAGNOSIS — B078 Other viral warts: Secondary | ICD-10-CM | POA: Diagnosis not present

## 2016-04-10 DIAGNOSIS — D1801 Hemangioma of skin and subcutaneous tissue: Secondary | ICD-10-CM | POA: Diagnosis not present

## 2016-04-10 DIAGNOSIS — L821 Other seborrheic keratosis: Secondary | ICD-10-CM | POA: Diagnosis not present

## 2016-04-15 ENCOUNTER — Other Ambulatory Visit (HOSPITAL_COMMUNITY): Payer: Self-pay | Admitting: *Deleted

## 2016-04-15 DIAGNOSIS — E876 Hypokalemia: Secondary | ICD-10-CM

## 2016-04-15 MED ORDER — KLOR-CON M20 20 MEQ PO TBCR: 20.0000 meq | EXTENDED_RELEASE_TABLET | Freq: Two times a day (BID) | ORAL | Status: DC

## 2016-04-18 ENCOUNTER — Encounter: Payer: Self-pay | Admitting: Nurse Practitioner

## 2016-04-18 ENCOUNTER — Ambulatory Visit (INDEPENDENT_AMBULATORY_CARE_PROVIDER_SITE_OTHER): Payer: Medicare Other | Admitting: Nurse Practitioner

## 2016-04-18 VITALS — BP 128/80 | Temp 98.4°F | Ht 68.0 in | Wt 190.4 lb

## 2016-04-18 DIAGNOSIS — R062 Wheezing: Secondary | ICD-10-CM

## 2016-04-18 DIAGNOSIS — I2510 Atherosclerotic heart disease of native coronary artery without angina pectoris: Secondary | ICD-10-CM

## 2016-04-18 DIAGNOSIS — J449 Chronic obstructive pulmonary disease, unspecified: Secondary | ICD-10-CM | POA: Insufficient documentation

## 2016-04-18 DIAGNOSIS — J441 Chronic obstructive pulmonary disease with (acute) exacerbation: Secondary | ICD-10-CM | POA: Diagnosis not present

## 2016-04-18 MED ORDER — ALBUTEROL SULFATE (2.5 MG/3ML) 0.083% IN NEBU
INHALATION_SOLUTION | RESPIRATORY_TRACT | Status: DC
Start: 2016-04-18 — End: 2016-04-19

## 2016-04-18 MED ORDER — BUDESONIDE 0.5 MG/2ML IN SUSP
0.5000 mg | Freq: Two times a day (BID) | RESPIRATORY_TRACT | Status: DC
Start: 2016-04-18 — End: 2016-04-19

## 2016-04-18 MED ORDER — ALBUTEROL SULFATE (2.5 MG/3ML) 0.083% IN NEBU
2.5000 mg | INHALATION_SOLUTION | Freq: Once | RESPIRATORY_TRACT | Status: AC
  Administered 2016-04-18: 2.5 mg via RESPIRATORY_TRACT

## 2016-04-19 ENCOUNTER — Other Ambulatory Visit: Payer: Self-pay

## 2016-04-19 MED ORDER — BUDESONIDE 0.5 MG/2ML IN SUSP: 0.5000 mg | Freq: Two times a day (BID) | RESPIRATORY_TRACT | Status: DC

## 2016-04-19 MED ORDER — ALBUTEROL SULFATE (2.5 MG/3ML) 0.083% IN NEBU: INHALATION_SOLUTION | RESPIRATORY_TRACT | Status: DC

## 2016-04-20 ENCOUNTER — Encounter: Payer: Self-pay | Admitting: Nurse Practitioner

## 2016-04-20 NOTE — Progress Notes (Signed)
Subjective:  Presents for complaints of continued wheezing and shortness of breath see previous notes. Patient seen for reactive airways on 3/29 and 5/9. No relief with inhaler. Frequent cough producing clear mucus. Symptoms worse when he lays down at night. Occasional coughing to the point where he has dry heaves. No abdominal pain. Reflux is been stable. No fevers. No nausea. Has an appointment with cardiology for follow-up.  Objective:   BP 128/80 mmHg  Temp(Src) 98.4 F (36.9 C) (Oral)  Ht 5\' 8"  (1.727 m)  Wt 190 lb 6 oz (86.354 kg)  BMI 28.95 kg/m2  SpO2 95% NAD. Alert, oriented. TMs mild clear effusion, no erythema. Pharynx clear. Neck supple with minimal adenopathy. Lungs initially mildly diminished breath sounds with expiratory wheezes noted throughout lung fields. No tachypnea. Normal color. Given albuterol 2.5 mg nebulizer treatment. Airflow improved with occasional faint wheeze anterior only. Also subjective improvement of symptoms. Heart regular rate rhythm.  Assessment:  Problem List Items Addressed This Visit      Respiratory   COPD (chronic obstructive pulmonary disease) (HCC)   Relevant Medications   levocetirizine (XYZAL) 5 MG tablet   albuterol (PROVENTIL) (2.5 MG/3ML) 0.083% nebulizer solution 2.5 mg (Completed)    Other Visit Diagnoses    Wheezing    -  Primary    Relevant Medications    albuterol (PROVENTIL) (2.5 MG/3ML) 0.083% nebulizer solution 2.5 mg (Completed)      Plan:  Meds ordered this encounter  Medications  . levocetirizine (XYZAL) 5 MG tablet    Sig: Take 5 mg by mouth every evening.  Marland Kitchen albuterol (PROVENTIL) (2.5 MG/3ML) 0.083% nebulizer solution 2.5 mg    Sig:   . DISCONTD: albuterol (PROVENTIL) (2.5 MG/3ML) 0.083% nebulizer solution    Sig: Use via neb q 4 hours prn wheezing    Dispense:  75 vial    Refill:  2    Order Specific Question:  Supervising Provider    Answer:  Mikey Kirschner [[id number]]  . DISCONTD: budesonide (PULMICORT) 0.5 MG/2ML  nebulizer solution    Sig: Take 2 mLs (0.5 mg total) by nebulization 2 (two) times daily.    Dispense:  120 mL    Refill:  2    Order Specific Question:  Supervising Provider    Answer:  Mikey Kirschner [[id number]]   Because of recent exacerbations, patient started on nebulizer at home including daily Pulmicort to prevent wheezing. May alternate inhaler with nebulizer as directed. Warning signs reviewed. Call back in 2 weeks if no improvement, sooner if worse.

## 2016-04-21 LAB — CUP PACEART REMOTE DEVICE CHECK
Battery Voltage: 2.99 V
Brady Statistic AP VP Percent: 1.21 %
Brady Statistic AP VS Percent: 90.51 %
Brady Statistic AS VS Percent: 8.25 %
Brady Statistic RV Percent Paced: 1.24 %
Date Time Interrogation Session: 20170522195355
Implantable Lead Location: 753860
Implantable Lead Model: 5086
Lead Channel Impedance Value: 408 Ohm
Lead Channel Sensing Intrinsic Amplitude: 1.996 mV
Lead Channel Setting Pacing Amplitude: 2 V
Lead Channel Setting Pacing Pulse Width: 0.6 ms
MDC IDC LEAD IMPLANT DT: 20120910
MDC IDC LEAD IMPLANT DT: 20120910
MDC IDC LEAD LOCATION: 753859
MDC IDC LEAD MODEL: 5086
MDC IDC MSMT LEADCHNL RV IMPEDANCE VALUE: 368 Ohm
MDC IDC MSMT LEADCHNL RV SENSING INTR AMPL: 4.04 mV
MDC IDC SET LEADCHNL RV PACING AMPLITUDE: 2.5 V
MDC IDC SET LEADCHNL RV SENSING SENSITIVITY: 0.9 mV
MDC IDC STAT BRADY AS VP PERCENT: 0.03 %
MDC IDC STAT BRADY RA PERCENT PACED: 91.72 %

## 2016-04-23 ENCOUNTER — Telehealth: Payer: Self-pay | Admitting: Nurse Practitioner

## 2016-04-23 ENCOUNTER — Other Ambulatory Visit: Payer: Self-pay | Admitting: Nurse Practitioner

## 2016-04-23 MED ORDER — DOXYCYCLINE HYCLATE 100 MG PO TABS: 100.0000 mg | ORAL_TABLET | Freq: Two times a day (BID) | ORAL | Status: DC

## 2016-04-23 NOTE — Telephone Encounter (Signed)
Fever last night. Coughing up green mucus. Using budesonide bid. Albuterol inhaler bid, and neb treatments once to twice a day. Not any better.

## 2016-04-23 NOTE — Telephone Encounter (Signed)
Pt is calling to say he is a little bit better, is using meds as you  Told him to do and wants to know if you feel he is going in the right Direction. Please advise

## 2016-04-23 NOTE — Telephone Encounter (Signed)
E [facility] to get more info seen 04/18/16 for wheezing

## 2016-04-23 NOTE — Telephone Encounter (Signed)
Discussed with pt. Pt verbalized understanding.  °

## 2016-04-23 NOTE — Telephone Encounter (Signed)
Called in an antibiotic. Continue neb treatments. Call back by Friday if no improvement.

## 2016-04-24 ENCOUNTER — Emergency Department (HOSPITAL_COMMUNITY): Payer: Medicare Other

## 2016-04-24 ENCOUNTER — Encounter (HOSPITAL_COMMUNITY): Payer: Self-pay | Admitting: Emergency Medicine

## 2016-04-24 ENCOUNTER — Telehealth: Payer: Self-pay | Admitting: *Deleted

## 2016-04-24 ENCOUNTER — Emergency Department (HOSPITAL_COMMUNITY)
Admission: EM | Admit: 2016-04-24 | Discharge: 2016-04-24 | Disposition: A | Discharge status: Home / Self Care | Payer: Medicare Other | Attending: Emergency Medicine | Admitting: Emergency Medicine

## 2016-04-24 DIAGNOSIS — R509 Fever, unspecified: Secondary | ICD-10-CM | POA: Diagnosis not present

## 2016-04-24 DIAGNOSIS — F329 Major depressive disorder, single episode, unspecified: Secondary | ICD-10-CM | POA: Insufficient documentation

## 2016-04-24 DIAGNOSIS — Z8546 Personal history of malignant neoplasm of prostate: Secondary | ICD-10-CM | POA: Diagnosis not present

## 2016-04-24 DIAGNOSIS — Z8673 Personal history of transient ischemic attack (TIA), and cerebral infarction without residual deficits: Secondary | ICD-10-CM | POA: Diagnosis not present

## 2016-04-24 DIAGNOSIS — R0602 Shortness of breath: Secondary | ICD-10-CM | POA: Diagnosis not present

## 2016-04-24 DIAGNOSIS — Z79899 Other long term (current) drug therapy: Secondary | ICD-10-CM | POA: Diagnosis not present

## 2016-04-24 DIAGNOSIS — J441 Chronic obstructive pulmonary disease with (acute) exacerbation: Secondary | ICD-10-CM

## 2016-04-24 DIAGNOSIS — J449 Chronic obstructive pulmonary disease, unspecified: Secondary | ICD-10-CM | POA: Insufficient documentation

## 2016-04-24 DIAGNOSIS — E785 Hyperlipidemia, unspecified: Secondary | ICD-10-CM | POA: Insufficient documentation

## 2016-04-24 DIAGNOSIS — I2510 Atherosclerotic heart disease of native coronary artery without angina pectoris: Secondary | ICD-10-CM | POA: Insufficient documentation

## 2016-04-24 DIAGNOSIS — R05 Cough: Secondary | ICD-10-CM | POA: Diagnosis not present

## 2016-04-24 DIAGNOSIS — M1990 Unspecified osteoarthritis, unspecified site: Secondary | ICD-10-CM | POA: Insufficient documentation

## 2016-04-24 DIAGNOSIS — M791 Myalgia: Secondary | ICD-10-CM | POA: Insufficient documentation

## 2016-04-24 DIAGNOSIS — I110 Hypertensive heart disease with heart failure: Secondary | ICD-10-CM | POA: Diagnosis not present

## 2016-04-24 DIAGNOSIS — I509 Heart failure, unspecified: Secondary | ICD-10-CM | POA: Diagnosis not present

## 2016-04-24 DIAGNOSIS — Z87891 Personal history of nicotine dependence: Secondary | ICD-10-CM | POA: Diagnosis not present

## 2016-04-24 LAB — CBC WITH DIFFERENTIAL/PLATELET
BASOS PCT: 1 %
Basophils Absolute: 0 10*3/uL (ref 0.0–0.1)
Eosinophils Absolute: 0.1 10*3/uL (ref 0.0–0.7)
Eosinophils Relative: 2 %
HEMATOCRIT: 32.3 % — AB (ref 39.0–52.0)
HEMOGLOBIN: 10.8 g/dL — AB (ref 13.0–17.0)
LYMPHS ABS: 1.6 10*3/uL (ref 0.7–4.0)
LYMPHS PCT: 34 %
MCH: 29.5 pg (ref 26.0–34.0)
MCHC: 33.4 g/dL (ref 30.0–36.0)
MCV: 88.3 fL (ref 78.0–100.0)
MONOS PCT: 10 %
Monocytes Absolute: 0.5 10*3/uL (ref 0.1–1.0)
NEUTROS ABS: 2.5 10*3/uL (ref 1.7–7.7)
NEUTROS PCT: 54 %
Platelets: 102 10*3/uL — ABNORMAL LOW (ref 150–400)
RBC: 3.66 MIL/uL — ABNORMAL LOW (ref 4.22–5.81)
RDW: 16 % — ABNORMAL HIGH (ref 11.5–15.5)
WBC: 4.6 10*3/uL (ref 4.0–10.5)

## 2016-04-24 LAB — BASIC METABOLIC PANEL
Anion gap: 8 (ref 5–15)
BUN: 11 mg/dL (ref 6–20)
CHLORIDE: 106 mmol/L (ref 101–111)
CO2: 20 mmol/L — AB (ref 22–32)
CREATININE: 1.02 mg/dL (ref 0.61–1.24)
Calcium: 8.4 mg/dL — ABNORMAL LOW (ref 8.9–10.3)
GFR calc non Af Amer: >60 mL/min (ref >60)
GLUCOSE: 119 mg/dL — AB (ref 65–99)
Potassium: 3.7 mmol/L (ref 3.5–5.1)
Sodium: 134 mmol/L — ABNORMAL LOW (ref 135–145)

## 2016-04-24 LAB — TROPONIN I: Troponin I: <0.03 ng/mL (ref <0.031)

## 2016-04-24 MED ORDER — HEPARIN SOD (PORK) LOCK FLUSH 100 UNIT/ML IV SOLN
500.0000 [IU] | Freq: Once | INTRAVENOUS | Status: AC
  Administered 2016-04-24: 500 [IU] via INTRAVENOUS
  Filled 2016-04-24: qty 5

## 2016-04-24 MED ORDER — METHYLPREDNISOLONE SODIUM SUCC 125 MG IJ SOLR
80.0000 mg | Freq: Once | INTRAMUSCULAR | Status: AC
  Administered 2016-04-24: 80 mg via INTRAVENOUS
  Filled 2016-04-24: qty 2

## 2016-04-24 MED ORDER — ALBUTEROL (5 MG/ML) CONTINUOUS INHALATION SOLN
10.0000 mg/h | INHALATION_SOLUTION | RESPIRATORY_TRACT | Status: AC
  Administered 2016-04-24: 10 mg/h via RESPIRATORY_TRACT
  Filled 2016-04-24: qty 20

## 2016-04-24 MED ORDER — PREDNISONE 20 MG PO TABS: ORAL_TABLET | ORAL | Status: DC

## 2016-04-24 MED ORDER — IPRATROPIUM BROMIDE 0.02 % IN SOLN
0.5000 mg | Freq: Once | RESPIRATORY_TRACT | Status: AC
  Administered 2016-04-24: 0.5 mg via RESPIRATORY_TRACT
  Filled 2016-04-24: qty 2.5

## 2016-04-24 MED ORDER — SODIUM CHLORIDE 0.9 % IV BOLUS (SEPSIS)
1000.0000 mL | Freq: Once | INTRAVENOUS | Status: AC
  Administered 2016-04-24: 1000 mL via INTRAVENOUS

## 2016-04-24 MED ORDER — IOPAMIDOL (ISOVUE-370) INJECTION 76%
100.0000 mL | Freq: Once | INTRAVENOUS | Status: AC | PRN
  Administered 2016-04-24: 100 mL via INTRAVENOUS

## 2016-04-24 NOTE — ED Notes (Signed)
Pt reports shortness of breath, cough for last several weeks. Fever since last night. Pt reports productive cough and chest discomfort with coughing. nad noted.   Pt wife reports pt was seen at PCP office for same and was prescribed abx on Friday.

## 2016-04-24 NOTE — ED Provider Notes (Signed)
History  By signing my name below, I, Jeremy Parrish, attest that this documentation has been prepared under the direction and in the presence of Jeremy Morrison, MD. Electronically Signed: Bea Parrish, ED Scribe. 04/24/2016. 12:32 PM.  Chief Complaint  Patient presents with  . Shortness of Breath   The history is provided by the patient, medical records and a relative. No language interpreter was used.    HPI Comments:  Jeremy Parrish is a 75 y.o. male with PMHx of HTN, CAD, cancer, HLD, CHF, COPD, MI and PAF who presents to the Emergency Department complaining of SOB that began worsening in the past 5 days. Wife states he has been having difficulty breathing over the last several weeks. Wife reports associated fever (Tmax 100.5 degrees) and cough. Wife states he was seen by his PCP for this 5 days ago and was prescribed an antibiotic. He reports some chest wall soreness secondary to coughing. He has not done anything for treatment of his symptoms. He denies modifying factors. He denies CP, abdominal pain, back pain, nausea, vomiting, diarrhea. He has PMHx of non-Hodgkins lymphoma but is not currently on chemotherapy. Pt is a former smoker quitting 21 years ago. He denies any recent surgeries. He is on anticoagulant therapy of Plavix and Eliquis.  Past Medical History  Diagnosis Date  . Hypertension   . Heart disease   . Kidney stones     history  . Lung mass   . Heart murmur   . 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 Coronary artery disease   . Depression   . Shortness of breath   . Peripheral arterial disease ([place])   . Bladder neck contracture   . Personal history of other diseases of circulatory system   . Aortic aneurysm of unspecified site without mention of rupture   . Arthritis   . Intestinovesical fistula   . Esophageal reflux   . Hyperlipidemia   . Anemia   . CHF (congestive heart failure) ([place])    . COPD (chronic obstructive pulmonary disease) ([place])   . Cerebral atherosclerosis     Carotid Doppler, 02/16/2013 - Bilateral Proximal ICAs,demonstrate mild plaque w/o evidence of significant diameter reduction, dissection, or any other vascular abnormality  . Complication of anesthesia   . PONV (postoperative nausea and vomiting)   . Stroke [facility]) 2013    Speech.  . Hx of bladder cancer 10/07/2012  . Sleep apnea     05-02-14 cpap , not yet used- suggested settings 5  . Cancer ([place])   . Prostate cancer ([place]) 2000  . Multiple myeloma   . Non Hodgkin's lymphoma ([place])   . Myocardial infarction [facility])     '96  . Shingles   . Paroxysmal atrial fibrillation ([place]) 01/02/2016   Past Surgical History  Procedure Laterality Date  . Prostate surgery    . Heart stents x 5  1999  . Portacath placement  07/26/2009    right chest  . Wrist surgery      right  . Left ear skin cancer removed    . Bone marrow transplant  2011  . Pacemaker insertion  07/22/2011    [employer]  . Coronary angioplasty  06/24/2000    PCI and stenting in mid & proximal RCA  . Insert / replace / remove pacemaker    . Tee without cardioversion  10/13/2012    Procedure: TRANSESOPHAGEAL ECHOCARDIOGRAM (TEE);  Surgeon: Sanda Klein, MD;  Location: [facility];  Service: Cardiovascular;  Laterality: N/A;  pat/kay/echo notified  . Colonoscopy N/A 01/01/2013    Procedure: COLONOSCOPY;  Surgeon: Rogene Houston, MD;  Location: [facility];  Service: Endoscopy;  Laterality: N/A;  825-moved to [place] notified pt  . Rotator    . Rotator cuff repair Right   . Colon surgery      colon resection  . Bladder surgery    . Shoulder arthroscopy with subacromial decompression Right 07/21/2013    Procedure: RIGHT SHOULDER ARTHROSCOPY WITH SUBACROMIAL DECOMPRESSION AND DEBRIDEMENT & Injection of Left Shoulder;  Surgeon: Alta Corning, MD;  Location: [place];  Service: Orthopedics;  Laterality: Right;  . US echocardiography  06/19/2011    RV  mildly dilated,mild to mod. MR,mild AI,mild PI  . Nm myocar perf wall motion  11/27/2007    inferior scar  . Inguinal hernia repair Right 05/04/2014    Procedure: OPEN RIGHT INGUINAL HERNIA REPAIR with mesh;  Surgeon: Edward Jolly, MD;  Location: [facility] ORS;  Service: General;  Laterality: Right;   Family History  Problem Relation Age of Onset  . Colon cancer Neg Hx   . Colon polyps Neg Hx   . Cancer Father     bladder  . Heart disease Father     before age 92  . Hypertension Mother   . Cancer Brother   . Heart disease Brother     before age 25  . Heart disease Sister     before age 59  . Hyperlipidemia Sister   . Hypertension Sister   . Heart attack Sister    Social History  Substance Use Topics  . Smoking status: Former Smoker -- 1.00 packs/day for 20 years    Types: Cigarettes    Quit date: 11/14/1994  . Smokeless tobacco: Never Used  . Alcohol Use: No     Comment: previously drank but none for at least 15 years.    Review of Systems  Constitutional: Positive for fever.  Respiratory: Positive for cough and shortness of breath.   Musculoskeletal: Positive for myalgias (chest wall secondary to cough).  All other systems reviewed and are negative.   Allergies  Morphine and related and Tape  Home Medications   Prior to Admission medications   Medication Sig Start Date End Date Taking? Authorizing Provider  albuterol (PROVENTIL HFA;VENTOLIN HFA) 108 (90 Base) MCG/ACT inhaler Inhale 2 puffs into the lungs every 6 (six) hours as needed for wheezing or shortness of breath. 03/25/16  Yes Mikey Kirschner, MD  albuterol (PROVENTIL) (2.5 MG/3ML) 0.083% nebulizer solution Use via neb q 4 hours prn wheezing 04/19/16  Yes Nilda Simmer, NP  ALPRAZolam Duanne Moron) 0.5 MG tablet Take 1-2 tablets (0.5-1 mg total) by mouth at bedtime as needed for anxiety. 02/09/15  Yes Manon Hilding Kefalas, PA-C  amLODipine (NORVASC) 10 MG tablet TAKE 1 TABLET (10 MG TOTAL) BY MOUTH EVERY MORNING.  12/11/15  Yes Mikey Kirschner, MD  apixaban (ELIQUIS) 5 MG TABS tablet Take 1 tablet (5 mg total) by mouth 2 (two) times daily. 01/02/16  Yes Mihai Croitoru, MD  budesonide (PULMICORT) 0.5 MG/2ML nebulizer solution Take 2 mLs (0.5 mg total) by nebulization 2 (two) times daily. 04/19/16  Yes Nilda Simmer, NP  chlorzoxazone (PARAFON) 500 MG tablet Take 1 tablet (500 mg total) by mouth 3 (three) times daily as needed for muscle spasms. 12/07/15  Yes Mikey Kirschner, MD  citalopram (CELEXA) 40 MG tablet TAKE 1  TABLET (40 MG TOTAL) BY MOUTH DAILY. 02/19/16  Yes Mikey Kirschner, MD  clopidogrel (PLAVIX) 75 MG tablet TAKE 1 TABLET BY MOUTH EVERY DAY 01/22/16  Yes Mihai Croitoru, MD  doxycycline (VIBRA-TABS) 100 MG tablet Take 1 tablet (100 mg total) by mouth 2 (two) times daily. 04/23/16  Yes Nilda Simmer, NP  HYDROcodone-acetaminophen (NORCO/VICODIN) 5-325 MG tablet Take 1 tablet by mouth 2 (two) times daily as needed. 03/08/16  Yes Thomas S Kefalas, PA-C  KLOR-CON M20 20 MEQ tablet Take 1 tablet (20 mEq total) by mouth 2 (two) times daily. Reported on 11/15/2015 04/15/16  Yes Manon Hilding Kefalas, PA-C  lenalidomide (REVLIMID) 10 MG capsule Take 1 capsule (10 mg total) by mouth daily. Take 1 capsule by mouth daily for 7 days on followed by 7 days off 04/01/16  Yes Baird Cancer, PA-C  levocetirizine (XYZAL) 5 MG tablet Take 5 mg by mouth every evening.   Yes Historical Provider, MD  nitroGLYCERIN (NITROSTAT) 0.4 MG SL tablet Place 1 tablet (0.4 mg total) under the tongue every 5 (five) minutes as needed for chest pain. 06/29/15  Yes Mikey Kirschner, MD  omeprazole (PRILOSEC) 20 MG capsule TAKE 1 CAPSULE (20 MG TOTAL) BY MOUTH DAILY. 02/26/16  Yes Mikey Kirschner, MD  simvastatin (ZOCOR) 20 MG tablet Take 1 tablet (20 mg total) by mouth at bedtime. 06/05/15  Yes Lorretta Harp, MD  predniSONE (DELTASONE) 20 MG tablet 3 tabs po day one, then 2 tabs daily x 4 days 04/24/16   Jeremy Morrison, MD   Triage Vitals:  BP 128/54 mmHg  Pulse 54  Temp(Src) 98.6 F (37 C) (Oral)  Resp 18  Ht '5\' 8"'$  (1.727 m)  Wt 190 lb (86.183 kg)  BMI 28.90 kg/m2  SpO2 97% Physical Exam  Constitutional: He is oriented to person, place, and time. He appears well-developed and well-nourished.  HENT:  Head: Normocephalic and atraumatic.  Eyes: EOM are normal.  Neck: Normal range of motion.  Cardiovascular: Normal rate, regular rhythm and normal heart sounds.  Exam reveals no gallop and no friction rub.   No murmur heard. Pulmonary/Chest: Effort normal. He has wheezes (anterior expiratory wheezes).  Wheezes expiratory bilaterally.  Abdominal: Soft. He exhibits no distension and no mass. There is no tenderness. There is no rebound and no guarding.  Musculoskeletal: Normal range of motion. He exhibits edema.  Minimal swelling to BLE.  Neurological: He is alert and oriented to person, place, and time.  Skin: Skin is warm and dry.  Psychiatric: He has a normal mood and affect. His behavior is normal.  Nursing note and vitals reviewed.   ED Course  Procedures (including critical care time) DIAGNOSTIC STUDIES: Oxygen Saturation is 97% on RA, normal by my interpretation.   COORDINATION OF CARE: 11:53 AM- Will order CXR and labs. Pt verbalizes understanding and agrees to plan.  Medications  albuterol (PROVENTIL,VENTOLIN) solution continuous neb (0 mg/hr Nebulization Stopped 04/24/16 1715)  ipratropium (ATROVENT) nebulizer solution 0.5 mg (0.5 mg Nebulization Given 04/24/16 1247)  methylPREDNISolone sodium succinate (SOLU-MEDROL) 125 mg/2 mL injection 80 mg (80 mg Intravenous Given 04/24/16 1341)  sodium chloride 0.9 % bolus 1,000 mL (0 mLs Intravenous Stopped 04/24/16 1711)  iopamidol (ISOVUE-370) 76 % injection 100 mL (100 mLs Intravenous Contrast Given 04/24/16 1432)  heparin lock flush 100 unit/mL (500 Units Intravenous Given 04/24/16 1704)    Labs Review Labs Reviewed  CBC WITH DIFFERENTIAL/PLATELET - Abnormal;  Notable for the following:    RBC  3.66 (*)    Hemoglobin 10.8 (*)    HCT 32.3 (*)    RDW 16.0 (*)    Platelets 102 (*)    All other components within normal limits  BASIC METABOLIC PANEL - Abnormal; Notable for the following:    Sodium 134 (*)    CO2 20 (*)    Glucose, Bld 119 (*)    Calcium 8.4 (*)    All other components within normal limits  TROPONIN I    Imaging Review No results found. I have personally reviewed and evaluated these images and lab results as part of my medical decision-making.   EKG Interpretation   Date/Time:  Wednesday April 24 2016 11:04:45 EDT Ventricular Rate:  75 PR Interval:  176 QRS Duration: 172 QT Interval:  460 QTC Calculation: 513 R Axis:   -99 Text Interpretation:  Sinus bradycardia with marked sinus arrhythmia with  frequent AV dual-paced complexes Right bundle branch block Confirmed by  Zalaya Astarita MD, Cordie Beazley [phone]) on 04/24/2016 11:23:11 AM      MDM   Final diagnoses:  Acute exacerbation of chronic obstructive pulmonary disease (COPD) ([place])   Patient with shortness of breath and cough.  Clinically COPD and possible pneumonia.   CT no PE, no pneumonia, pt improved with cont neb and wants to try outpt with steroids.  Results and differential diagnosis were discussed with the patient/parent/guardian. Xrays were independently reviewed by myself.  Close follow up outpatient was discussed, comfortable with the plan.   Medications  albuterol (PROVENTIL,VENTOLIN) solution continuous neb (0 mg/hr Nebulization Stopped 04/24/16 1715)  ipratropium (ATROVENT) nebulizer solution 0.5 mg (0.5 mg Nebulization Given 04/24/16 1247)  methylPREDNISolone sodium succinate (SOLU-MEDROL) 125 mg/2 mL injection 80 mg (80 mg Intravenous Given 04/24/16 1341)  sodium chloride 0.9 % bolus 1,000 mL (0 mLs Intravenous Stopped 04/24/16 1711)  iopamidol (ISOVUE-370) 76 % injection 100 mL (100 mLs Intravenous Contrast Given 04/24/16 1432)  heparin lock flush 100 unit/mL  (500 Units Intravenous Given 04/24/16 1704)    Filed Vitals:   04/24/16 1513 04/24/16 1530 04/24/16 1600 04/24/16 1630  BP: 110/81 126/69 115/59 126/56  Pulse: 69 65 65 66  Temp: 98.5 F (36.9 C)     TempSrc: Oral     Resp: '20 20 17 19  '$ Height:      Weight:      SpO2: 93% 96% 95% 91%    Final diagnoses:  Acute exacerbation of chronic obstructive pulmonary disease (COPD) ([facility])     Jeremy Morrison, MD 04/26/16 1650

## 2016-04-24 NOTE — ED Notes (Signed)
Family at bedside. 

## 2016-04-24 NOTE — Discharge Instructions (Signed)
Use albuterol as needed. Follow up with your doctor for recheck or return to ER for worsening breathing or fevers.  If you were given medicines take as directed.  If you are on coumadin or contraceptives realize their levels and effectiveness is altered by many different medicines.  If you have any reaction (rash, tongues swelling, other) to the medicines stop taking and see a physician.    If your blood pressure was elevated in the ER make sure you follow up for management with a primary doctor or return for chest pain, shortness of breath or stroke symptoms.  Please follow up as directed and return to the ER or see a physician for new or worsening symptoms.  Thank you. Filed Vitals:   04/24/16 1130 04/24/16 1254 04/24/16 1300 04/24/16 1513  BP:   128/79 110/81  Pulse: 68  58 69  Temp:    98.5 F (36.9 C)  TempSrc:    Oral  Resp: 21  26 20   Height:      Weight:      SpO2: 95% 98% 96% 93%

## 2016-04-24 NOTE — Telephone Encounter (Signed)
Pt's wife called states pt is having difficulty breathing, at times gasping for air. Using two inhalers and neb treatments. On antibiotic. Advised to go directly to ED. Wife states she is going to take him to [facility] ED.

## 2016-04-24 NOTE — ED Notes (Signed)
Patient is resting comfortably. 

## 2016-04-25 ENCOUNTER — Encounter: Payer: Self-pay | Admitting: Cardiology

## 2016-04-26 ENCOUNTER — Other Ambulatory Visit (HOSPITAL_COMMUNITY): Payer: Self-pay | Admitting: Oncology

## 2016-04-26 DIAGNOSIS — C9001 Multiple myeloma in remission: Secondary | ICD-10-CM

## 2016-04-26 MED ORDER — LENALIDOMIDE 10 MG PO CAPS: 10.0000 mg | ORAL_CAPSULE | Freq: Every day | ORAL | Status: DC

## 2016-04-28 ENCOUNTER — Other Ambulatory Visit (HOSPITAL_COMMUNITY): Payer: Self-pay | Admitting: *Deleted

## 2016-04-28 DIAGNOSIS — C9001 Multiple myeloma in remission: Secondary | ICD-10-CM

## 2016-04-29 ENCOUNTER — Encounter (HOSPITAL_COMMUNITY): Payer: Medicare Other | Attending: Oncology

## 2016-04-29 DIAGNOSIS — C9000 Multiple myeloma not having achieved remission: Secondary | ICD-10-CM | POA: Insufficient documentation

## 2016-04-29 DIAGNOSIS — C9001 Multiple myeloma in remission: Secondary | ICD-10-CM | POA: Diagnosis not present

## 2016-04-29 DIAGNOSIS — Z8572 Personal history of non-Hodgkin lymphomas: Secondary | ICD-10-CM

## 2016-04-29 LAB — CBC WITH DIFFERENTIAL/PLATELET
BASOS ABS: 0 10*3/uL (ref 0.0–0.1)
BASOS PCT: 0 %
EOS ABS: 0.1 10*3/uL (ref 0.0–0.7)
Eosinophils Relative: 1 %
HEMATOCRIT: 37.4 % — AB (ref 39.0–52.0)
HEMOGLOBIN: 12.4 g/dL — AB (ref 13.0–17.0)
Lymphocytes Relative: 30 %
Lymphs Abs: 1.8 10*3/uL (ref 0.7–4.0)
MCH: 29.5 pg (ref 26.0–34.0)
MCHC: 33.2 g/dL (ref 30.0–36.0)
MCV: 89 fL (ref 78.0–100.0)
MONOS PCT: 14 %
Monocytes Absolute: 0.9 10*3/uL (ref 0.1–1.0)
NEUTROS ABS: 3.4 10*3/uL (ref 1.7–7.7)
NEUTROS PCT: 55 %
Platelets: 158 10*3/uL (ref 150–400)
RBC: 4.2 MIL/uL — ABNORMAL LOW (ref 4.22–5.81)
RDW: 16.2 % — ABNORMAL HIGH (ref 11.5–15.5)
WBC: 6.1 10*3/uL (ref 4.0–10.5)

## 2016-04-29 LAB — COMPREHENSIVE METABOLIC PANEL
ALT: 28 U/L (ref 17–63)
ANION GAP: 6 (ref 5–15)
AST: 23 U/L (ref 15–41)
Albumin: 3.3 g/dL — ABNORMAL LOW (ref 3.5–5.0)
Alkaline Phosphatase: 59 U/L (ref 38–126)
BUN: 14 mg/dL (ref 6–20)
CHLORIDE: 106 mmol/L (ref 101–111)
CO2: 24 mmol/L (ref 22–32)
Calcium: 8.5 mg/dL — ABNORMAL LOW (ref 8.9–10.3)
Creatinine, Ser: 0.98 mg/dL (ref 0.61–1.24)
Glucose, Bld: 138 mg/dL — ABNORMAL HIGH (ref 65–99)
POTASSIUM: 3.3 mmol/L — AB (ref 3.5–5.1)
Sodium: 136 mmol/L (ref 135–145)
TOTAL PROTEIN: 6.3 g/dL — AB (ref 6.5–8.1)
Total Bilirubin: 0.6 mg/dL (ref 0.3–1.2)

## 2016-04-29 LAB — LACTATE DEHYDROGENASE: LDH: 128 U/L (ref 98–192)

## 2016-04-29 LAB — C-REACTIVE PROTEIN: CRP: 0.7 mg/dL (ref <1.0)

## 2016-04-29 LAB — SEDIMENTATION RATE: SED RATE: 21 mm/h — AB (ref 0–16)

## 2016-04-30 LAB — PROTEIN ELECTROPHORESIS, SERUM
A/G Ratio: 1 (ref 0.7–1.7)
ALPHA-2-GLOBULIN: 0.8 g/dL (ref 0.4–1.0)
Albumin ELP: 3 g/dL (ref 2.9–4.4)
Alpha-1-Globulin: 0.2 g/dL (ref 0.0–0.4)
BETA GLOBULIN: 0.9 g/dL (ref 0.7–1.3)
GAMMA GLOBULIN: 1 g/dL (ref 0.4–1.8)
Globulin, Total: 2.9 g/dL (ref 2.2–3.9)
Total Protein ELP: 5.9 g/dL — ABNORMAL LOW (ref 6.0–8.5)

## 2016-04-30 LAB — IMMUNOFIXATION ELECTROPHORESIS
IGA: 287 mg/dL (ref 61–437)
IGG (IMMUNOGLOBIN G), SERUM: 881 mg/dL (ref 700–1600)
IGM, SERUM: 31 mg/dL (ref 15–143)
TOTAL PROTEIN ELP: 6 g/dL (ref 6.0–8.5)

## 2016-04-30 LAB — KAPPA/LAMBDA LIGHT CHAINS
KAPPA, LAMDA LIGHT CHAIN RATIO: 1.17 (ref 0.26–1.65)
Kappa free light chain: 26.2 mg/L — ABNORMAL HIGH (ref 3.3–19.4)
Lambda free light chains: 22.4 mg/L (ref 5.7–26.3)

## 2016-04-30 LAB — BETA 2 MICROGLOBULIN, SERUM: BETA 2 MICROGLOBULIN: 2.2 mg/L (ref 0.6–2.4)

## 2016-05-02 ENCOUNTER — Encounter (HOSPITAL_BASED_OUTPATIENT_CLINIC_OR_DEPARTMENT_OTHER): Payer: Medicare Other

## 2016-05-02 ENCOUNTER — Encounter (HOSPITAL_COMMUNITY): Payer: Self-pay

## 2016-05-02 VITALS — BP 131/87 | HR 59 | Temp 97.5°F | Resp 18 | Wt 188.0 lb

## 2016-05-02 DIAGNOSIS — D801 Nonfamilial hypogammaglobulinemia: Secondary | ICD-10-CM

## 2016-05-02 MED ORDER — SODIUM CHLORIDE 0.9 % IJ SOLN: 10.0000 mL | INTRAMUSCULAR | Status: DC | PRN

## 2016-05-02 MED ORDER — SODIUM CHLORIDE 0.9 % IV SOLN
INTRAVENOUS | Status: DC
  Administered 2016-05-02 (×2): via INTRAVENOUS

## 2016-05-02 MED ORDER — ACETAMINOPHEN 325 MG PO TABS
650.0000 mg | ORAL_TABLET | Freq: Four times a day (QID) | ORAL | Status: DC | PRN
  Administered 2016-05-02: 650 mg via ORAL

## 2016-05-02 MED ORDER — OCTAGAM 10 GM/100ML IV SOLN
400.0000 mg/kg | Freq: Once | INTRAVENOUS | Status: AC
  Administered 2016-05-02: 35 g via INTRAVENOUS
  Filled 2016-05-02: qty 200

## 2016-05-02 MED ORDER — IMMUNE GLOBULIN (HUMAN) 10 GM/100ML IV SOLN: 0.4000 g/kg | Freq: Once | INTRAVENOUS | Status: DC

## 2016-05-02 MED ORDER — HEPARIN SOD (PORK) LOCK FLUSH 100 UNIT/ML IV SOLN
500.0000 [IU] | Freq: Once | INTRAVENOUS | Status: AC | PRN
  Administered 2016-05-02: 500 [IU]

## 2016-05-02 MED ORDER — OCTAGAM 10 GM/100ML IV SOLN: 400.0000 mg/kg | Freq: Once | INTRAVENOUS | Status: DC

## 2016-05-02 NOTE — Patient Instructions (Signed)
[place] at [facility] Discharge Instructions  RECOMMENDATIONS MADE BY THE CONSULTANT AND ANY TEST RESULTS WILL BE SENT TO YOUR REFERRING PHYSICIAN.  IVIG infusion today. Return as scheduled for lab work. Return as scheduled for infusions. Return as scheduled for office visit.   Thank you for choosing [place] at [facility] to provide your oncology and hematology care.  To afford each patient quality time with our provider, please arrive at least 15 minutes before your scheduled appointment time.   Beginning January 23rd 2017 lab work for the [name] will be done in the  Main lab at [facility] on 1st floor. If you have a lab appointment with the [facility] please come in thru the  Main Entrance and check in at the main information desk  You need to re-schedule your appointment should you arrive 10 or more minutes late.  We strive to give you quality time with our providers, and arriving late affects you and other patients whose appointments are after yours.  Also, if you no show three or more times for appointments you may be dismissed from the clinic at the providers discretion.     Again, thank you for choosing [facility].  Our hope is that these requests will decrease the amount of time that you wait before being seen by our physicians.       _____________________________________________________________  Should you have questions after your visit to [facility], please contact our office at [phone] between the hours of 8:30 a.m. and 4:30 p.m.  Voicemails left after 4:30 p.m. will not be returned until the following business day.  For prescription refill requests, have your pharmacy contact our office.         Resources For Cancer Patients and their Caregivers ? American Cancer Society: Can assist with transportation, wigs, general needs, runs Look Good Feel Better.         660-670-3110 ? Cancer Care: Provides financial assistance, online support groups, medication/co-pay assistance.  1-800-813-HOPE 469 053 6050) ? [place] Assists [place] cancer patients and their families through emotional , educational and financial support.  442-713-3685 ? [place] DSS Where to apply for food stamps, Medicaid and utility assistance. 562-054-9187 ? [facility]: Transportation to medical appointments. 336-451-9635 ? Social Security Administration: May apply for disability if have a Stage IV cancer. 8143130475 (339)010-0377 ? [employer], Disability and Transit Services: Assists with nutrition, care and transit needs. [place] Support Programs: @10RELATIVEDAYS @ > Cancer Support Group  2nd Tuesday of the month 1pm-2pm, Journey Room  > Creative Journey  3rd Tuesday of the month 1130am-1pm, Journey Room  > Look Good Feel Better  1st Wednesday of the month 10am-12 noon, Journey Room (Call [place] to register [phone])

## 2016-05-02 NOTE — Progress Notes (Signed)
1315:  Tolerated infusion w/o adverse reaction.  A&Ox4, in no distress.  VSS.  Discharged ambulatory.

## 2016-05-03 ENCOUNTER — Ambulatory Visit (INDEPENDENT_AMBULATORY_CARE_PROVIDER_SITE_OTHER): Payer: Medicare Other | Admitting: Family Medicine

## 2016-05-03 ENCOUNTER — Encounter: Payer: Self-pay | Admitting: Family Medicine

## 2016-05-03 VITALS — BP 136/86 | Ht 68.0 in | Wt 186.6 lb

## 2016-05-03 DIAGNOSIS — I2510 Atherosclerotic heart disease of native coronary artery without angina pectoris: Secondary | ICD-10-CM

## 2016-05-03 DIAGNOSIS — L259 Unspecified contact dermatitis, unspecified cause: Secondary | ICD-10-CM

## 2016-05-03 DIAGNOSIS — B354 Tinea corporis: Secondary | ICD-10-CM

## 2016-05-03 MED ORDER — PREDNISONE 20 MG PO TABS: ORAL_TABLET | ORAL | Status: DC

## 2016-05-03 MED ORDER — KETOCONAZOLE 2 % EX CREA: 1.0000 "application " | TOPICAL_CREAM | Freq: Two times a day (BID) | CUTANEOUS | Status: DC

## 2016-05-03 NOTE — Progress Notes (Signed)
   Subjective:    Patient ID: Jeremy Parrish, male    DOB: [birth year], 75 y.o.   MRN: [id number]  HPI Patient denies any fever chills states she's had a rash over the past couple weeks seemingly worse over the past few days itches a lot not draining denies fever chills or sweats Patietn arrives with c/o of rash on arm. Patient does have history of cancer Review of Systems Denies pain discomfort relates itching is tried OTC measures without much success    Objective:   Physical Exam I see no evidence of infection what appears to be a small tinea on the left elbow region along with what appears to be a contact dermatitis also a couple areas that could be contact dermatitis versus bug bites on the right side of his lower back and also the right arm. No sign of infection       Assessment & Plan:  Tinea corporis use Nizoral cream twice a day over the course of next 2 weeks if doesn't clear up with this may need dermatology referral  Probable contact dermatitis prednisone taper should gradually get better

## 2016-05-16 ENCOUNTER — Other Ambulatory Visit: Payer: Self-pay | Admitting: Family Medicine

## 2016-05-17 ENCOUNTER — Other Ambulatory Visit: Payer: Self-pay

## 2016-05-17 DIAGNOSIS — E785 Hyperlipidemia, unspecified: Secondary | ICD-10-CM

## 2016-05-17 MED ORDER — SIMVASTATIN 20 MG PO TABS: 20.0000 mg | ORAL_TABLET | Freq: Every day | ORAL | Status: DC

## 2016-05-20 DIAGNOSIS — M25572 Pain in left ankle and joints of left foot: Secondary | ICD-10-CM | POA: Diagnosis not present

## 2016-05-20 DIAGNOSIS — M25562 Pain in left knee: Secondary | ICD-10-CM | POA: Diagnosis not present

## 2016-05-22 ENCOUNTER — Other Ambulatory Visit (HOSPITAL_COMMUNITY): Payer: Self-pay | Admitting: Oncology

## 2016-05-22 ENCOUNTER — Ambulatory Visit (INDEPENDENT_AMBULATORY_CARE_PROVIDER_SITE_OTHER): Payer: Medicare Other | Admitting: Internal Medicine

## 2016-05-22 DIAGNOSIS — H35372 Puckering of macula, left eye: Secondary | ICD-10-CM | POA: Diagnosis not present

## 2016-05-22 DIAGNOSIS — H524 Presbyopia: Secondary | ICD-10-CM | POA: Diagnosis not present

## 2016-05-22 DIAGNOSIS — H5212 Myopia, left eye: Secondary | ICD-10-CM | POA: Diagnosis not present

## 2016-05-22 DIAGNOSIS — H52223 Regular astigmatism, bilateral: Secondary | ICD-10-CM | POA: Diagnosis not present

## 2016-05-23 ENCOUNTER — Encounter (INDEPENDENT_AMBULATORY_CARE_PROVIDER_SITE_OTHER): Payer: Self-pay | Admitting: Internal Medicine

## 2016-05-23 ENCOUNTER — Ambulatory Visit (INDEPENDENT_AMBULATORY_CARE_PROVIDER_SITE_OTHER): Payer: Medicare Other | Admitting: Internal Medicine

## 2016-05-23 ENCOUNTER — Encounter: Payer: Self-pay | Admitting: Family Medicine

## 2016-05-23 ENCOUNTER — Ambulatory Visit (INDEPENDENT_AMBULATORY_CARE_PROVIDER_SITE_OTHER): Payer: Medicare Other | Admitting: Family Medicine

## 2016-05-23 VITALS — BP 128/70 | Temp 97.7°F | Ht 68.0 in | Wt 189.2 lb

## 2016-05-23 VITALS — BP 126/76 | HR 84 | Temp 97.5°F | Resp 20 | Ht 68.0 in | Wt 189.0 lb

## 2016-05-23 DIAGNOSIS — B029 Zoster without complications: Secondary | ICD-10-CM

## 2016-05-23 DIAGNOSIS — I2510 Atherosclerotic heart disease of native coronary artery without angina pectoris: Secondary | ICD-10-CM | POA: Diagnosis not present

## 2016-05-23 MED ORDER — VALACYCLOVIR HCL 1 G PO TABS: 1000.0000 mg | ORAL_TABLET | Freq: Three times a day (TID) | ORAL | Status: DC

## 2016-05-23 NOTE — Patient Instructions (Addendum)
OV in 1 year. Continue Imodium and fiber. Advised to follow up with Dr Wolfgang Phoenix.

## 2016-05-23 NOTE — Progress Notes (Signed)
   Subjective:    Patient ID: Jeremy Parrish, male    DOB: [birth year], 75 y.o.   MRN: [id number]  Rash This is a new problem. The current episode started in the past 7 days. The problem is unchanged. The affected locations include the left lower leg. The rash is characterized by redness. It is unknown if there was an exposure to a precipitant. Past treatments include nothing. The treatment provided no relief.    Patient has no other concerns at this time.   Review of Systems  Skin: Positive for rash.   No headache, no major weight loss or weight gain, no chest pain no back pain abdominal pain no change in bowel habits complete ROS otherwise negative     Objective:   Physical Exam Alert vitals stable, NAD. Blood pressure good on repeat. HEENT normal. Lungs clear. Heart regular rate and rhythm.  Rash is uncomfortable. To palpation. Multiple clusters of blisters left anterior leg      Assessment & Plan:  Impression probable shingles discussed plan Valtrex 1 g 3 times a day 7 days. Local measures discussed. Patient asked about Lyrica for neuropathy. Since under pain management treatment for neuropathy via oncologist I encouraged him to ask the oncologist [facility]

## 2016-05-23 NOTE — Progress Notes (Signed)
Subjective:    Patient ID: Jeremy Parrish, male    DOB: 04-Sep-1941, 75 y.o.   MRN: [id number]  HPI Here today for f/u of his chronic diarrhea. He was last seen in May of this year by me. Stool studies were negative. Hx of diverticulitis and has had 10 inches of his colon removed due to diverticulitis.  He was advised at last OV to take Fiber 4gms daily and Imodium twice a day. He does eat fiber bars prn.  He had improved when I called him concerning his GI pathogen which was negative. He tells me he is doing better.  He is taking Imodium in am and in pm.  He is having a BM every day. He has very little diarrhea now. Appetite is good.  No weight loss. He is eating two good meals a day. He is trying to exercise as long as weather permits.  Takes IVIG once a month to boost his immune system for multiple myeloma.  Patient also c/o rash to his left lower leg.    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 Marcell Anger. 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 In 2011 he was diagnosed with IgG lambda multiple myeloma and treated with 4 cycles of Velcade and dexamethasone followed by autologous peripheral blood stem cell transplant and currently remains on maintenance Revlimid and  takes seven days on and 7 days off for his myeloma.   01/01/2013 Colonoscopy with snare polypectomy.  Indications: Patient is 75 year old Caucasian male with multiple medical problems who presents with chronic diarrhea. He has history of complicated sigmoid diverticulitis for which he had surgery in December 2007.  Impression:  Examination performed to cecum. Three small polyps snared from cecum and submitted together. Two small polyps  were coagulated as above. Single diverticulum at cecum with few more at descending colon. Open colonic anastomosis at 20 cm from the anal margin. No evidence of endoscopic colitis.   Review of Systems     Past Medical History  Diagnosis Date  . Hypertension   . Heart disease   . Kidney stones     history  . Lung mass   . Heart murmur   . 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 Coronary artery disease   . Depression   . Shortness of breath   . Peripheral arterial disease ([place])   . Bladder neck contracture   . Personal history of other diseases of circulatory system   . Aortic aneurysm of unspecified site without mention of rupture   . Arthritis   . Intestinovesical fistula   . Esophageal reflux   . Hyperlipidemia   . Anemia   . CHF (congestive heart failure) ([place])   . COPD (chronic obstructive pulmonary disease) ([place])   . Cerebral atherosclerosis     Carotid Doppler, 02/16/2013 - Bilateral Proximal ICAs,demonstrate mild plaque w/o evidence of significant diameter reduction, dissection, or any other vascular abnormality  . Complication of anesthesia   . PONV (postoperative nausea and vomiting)   . Stroke [facility]) 2013    Speech.  . Hx of bladder cancer 10/07/2012  . Sleep apnea     05-02-14  cpap , not yet used- suggested settings 5  . Cancer ([place])   . Prostate cancer ([place]) 2000  . Multiple myeloma   . Non Hodgkin's lymphoma ([place])   . Myocardial infarction [facility])     '96  . Shingles   . Paroxysmal atrial fibrillation ([place]) 01/02/2016    Past Surgical History  Procedure Laterality Date  . Prostate surgery    . Heart stents x 5  1999  . Portacath placement  07/26/2009    right chest  . Wrist surgery      right  . Left ear skin cancer removed    . Bone marrow transplant  2011  . Pacemaker insertion  07/22/2011    Medtronic  . Coronary angioplasty  06/24/2000    PCI and stenting in mid & proximal RCA    . Insert / replace / remove pacemaker    . Tee without cardioversion  10/13/2012    Procedure: TRANSESOPHAGEAL ECHOCARDIOGRAM (TEE);  Surgeon: Sanda Klein, MD;  Location: [facility] ENDOSCOPY;  Service: Cardiovascular;  Laterality: N/A;  pat/kay/echo notified  . Colonoscopy N/A 01/01/2013    Procedure: COLONOSCOPY;  Surgeon: Rogene Houston, MD;  [facility] ENDO [facility];  Service: Endoscopy;  Laterality: N/A;  825-moved to [place] notified pt  . Rotator    . Rotator cuff repair Right   . Colon surgery      colon resection  . Bladder surgery    . Shoulder arthroscopy with subacromial decompression Right 07/21/2013    Procedure: RIGHT SHOULDER ARTHROSCOPY WITH SUBACROMIAL DECOMPRESSION AND DEBRIDEMENT & Injection of Left Shoulder;  Surgeon: Alta Corning, MD;  Location: [place];  Service: Orthopedics;  Laterality: Right;  . US echocardiography  06/19/2011    RV mildly dilated,mild to mod. MR,mild AI,mild PI  . Nm myocar perf wall motion  11/27/2007    inferior scar  . Inguinal hernia repair Right 05/04/2014    Procedure: OPEN RIGHT INGUINAL HERNIA REPAIR with mesh;  Surgeon: Edward Jolly, MD;  Location: [facility] ORS;  Service: General;  Laterality: Right;    Allergies  Allergen Reactions  . Morphine And Related Other (See Comments)    hallucinations  . Tape Rash    Paper tape is ok    Current Outpatient Prescriptions on File Prior to Visit  Medication Sig Dispense Refill  . albuterol (PROVENTIL HFA;VENTOLIN HFA) 108 (90 Base) MCG/ACT inhaler Inhale 2 puffs into the lungs every 6 (six) hours as needed for wheezing or shortness of breath. 1 Inhaler 3  . albuterol (PROVENTIL) (2.5 MG/3ML) 0.083% nebulizer solution Use via neb q 4 hours prn wheezing 75 vial 2  . ALPRAZolam (XANAX) 0.5 MG tablet Take 1-2 tablets (0.5-1 mg total) by mouth at bedtime as needed for anxiety. 90 tablet 1  . amLODipine (NORVASC) 10 MG tablet TAKE 1 TABLET (10 MG TOTAL) BY MOUTH EVERY MORNING. 90 tablet 1  . apixaban  (ELIQUIS) 5 MG TABS tablet Take 1 tablet (5 mg total) by mouth 2 (two) times daily. 60 tablet 5  . budesonide (PULMICORT) 0.5 MG/2ML nebulizer solution Take 2 mLs (0.5 mg total) by nebulization 2 (two) times daily. 120 mL 2  . chlorzoxazone (PARAFON) 500 MG tablet Take 1 tablet (500 mg total) by mouth 3 (three) times daily as needed for muscle spasms. 60 tablet 2  . citalopram (CELEXA) 40 MG tablet TAKE 1 TABLET (40 MG TOTAL) BY MOUTH DAILY. 90 tablet 0  . clopidogrel (PLAVIX) 75 MG tablet TAKE 1 TABLET BY  MOUTH EVERY DAY 90 tablet 3  . doxycycline (VIBRA-TABS) 100 MG tablet Take 1 tablet (100 mg total) by mouth 2 (two) times daily. 20 tablet 0  . HYDROcodone-acetaminophen (NORCO/VICODIN) 5-325 MG tablet Take 1 tablet by mouth 2 (two) times daily as needed. 60 tablet 0  . ketoconazole (NIZORAL) 2 % cream Apply 1 application topically 2 (two) times daily. 30 g 4  . KLOR-CON M20 20 MEQ tablet Take 1 tablet (20 mEq total) by mouth 2 (two) times daily. Reported on 11/15/2015 180 tablet 2  . levocetirizine (XYZAL) 5 MG tablet Take 5 mg by mouth every evening.    . nitroGLYCERIN (NITROSTAT) 0.4 MG SL tablet Place 1 tablet (0.4 mg total) under the tongue every 5 (five) minutes as needed for chest pain. 25 tablet 0  . omeprazole (PRILOSEC) 20 MG capsule TAKE 1 CAPSULE (20 MG TOTAL) BY MOUTH DAILY. 90 capsule 1  . predniSONE (DELTASONE) 20 MG tablet 3 tabs po qd for 2 then 2 tabs daily x 4 days then 1 qd for 2d 16 tablet 0  . REVLIMID 10 MG capsule TAKE 1 CAPSULE DAILY FOR 7 DAYS ON FOLLOWED BY 7 DAYS OFF, REPEAT CYCLE 14 capsule 0  . simvastatin (ZOCOR) 20 MG tablet Take 1 tablet (20 mg total) by mouth at bedtime. 90 tablet 1   Current Facility-Administered Medications on File Prior to Visit  Medication Dose Route Frequency Provider Last Rate Last Dose  . acetaminophen (TYLENOL) tablet 650 mg  650 mg Oral Q6H PRN Baird Cancer, PA-C   650 mg at 12/09/14 0855  . sodium chloride 0.9 % injection 10 mL  10  mL Intracatheter PRN Baird Cancer, PA-C   10 mL at 12/09/14 0856     Objective:   Physical ExamBlood pressure 126/76, pulse 84, temperature 97.5 F (36.4 C), resp. rate 20, height '5\' 8"'$  (1.727 m), weight 189 lb (85.73 kg), SpO2 97 %. Alert and oriented. Skin warm and dry. Oral mucosa is moist.   . Sclera anicteric, conjunctivae is pink. Thyroid not enlarged. No cervical lymphadenopathy. Lungs clear. Heart regular rate and rhythm.  Abdomen is soft. Bowel sounds are positive. No hepatomegaly. No abdominal masses felt. No tenderness.  No edema to lower extremities.  Pustule rash to left lower leg,  Redness noted to rash. ? Shingles.        Assessment & Plan:  Chronic diarrhea which is better now. Continue Imodium and Fiber Bars.  Rash to left lower leg below knee. ? Shingles.Marland Kitchen He was advised to f/u with Dr. Wolfgang Phoenix concerning this rash.  OV in 1 year.

## 2016-05-28 ENCOUNTER — Ambulatory Visit (INDEPENDENT_AMBULATORY_CARE_PROVIDER_SITE_OTHER): Payer: Medicare Other | Admitting: Internal Medicine

## 2016-05-29 NOTE — Progress Notes (Signed)
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 Marcell Anger. 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 In 2011 he was diagnosed with IgG lambda multiple myeloma and treated with 4 cycles of Velcade and dexamethasone followed by autologous peripheral blood stem cell transplant and currently remains on maintenance Revlimid and also monthly intravenous IgG.  Zometa 4 mg intravenously monthly was discontinued with last treatment on 08/18/2014.   DIAGNOSIS: IgG lambda Myeloma   CURRENT THERAPY: IVIG  Revlimid 10 mg 7 on/7 off  INTERVAL HISTORY: Jeremy Parrish 75 y.o. 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  Hematologically, he denies any complaints and ROS questioning is negative.  Jeremy Parrish is here alone today, receiving IVIG. He is reclining in the bed. He recently had shingles, which he treated with Valtrex. When asked about his shingles, he confirms that they are almost gone. He denies any pain, other than just a little bit. He comments that Dr. Lance Sell PA treated him.  He mainly expressed some concerns about being unsteady on his feet, but says that he's trying to be careful.  He has had his flu shot. He confirms that he can afford things on his current insurance.  He wanted to know why he's been getting weaker, but says "I don't know," when asked if he wants physical therapy. He asked  what physical therapy consists of. He eventually stated "I'd like to try it." He confirms that he does not want to fall, and that he wants to be more stable.  He confirms that his heart skips around a bit. He says he continues to follow closely with cardiology.  He denies any pain in his belly. Appetite is good. Energy is unchanged. Jeremy Parrish also reports that his wife is getting her hip replaced on Monday.   Past Medical History:  Diagnosis Date  . Anemia   . Aortic aneurysm of unspecified site without mention of rupture   . Arthritis   . Bladder neck contracture   . Cancer ([place])   . Cerebral atherosclerosis    Carotid Doppler, 02/16/2013 - Bilateral Proximal ICAs,demonstrate mild plaque w/o evidence of significant diameter reduction, dissection, or any other vascular abnormality  . CHF (congestive heart failure) ([place])   . Complication of anesthesia   . COPD (chronic obstructive pulmonary disease) ([place])   . Coronary artery disease   . Depression   . Esophageal reflux   . Heart disease   . Heart murmur   . Hx of bladder cancer 10/07/2012  . Hyperlipidemia   . Hypertension   . 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 Intestinovesical fistula   . Kidney stones    history  . Lung mass   . Multiple myeloma   . Myocardial infarction [facility])    '96  . Non Hodgkin's lymphoma ([place])   . Paroxysmal atrial fibrillation ([place]) 01/02/2016  . Peripheral arterial disease ([place])   .  Personal history of other diseases of circulatory system   . PONV (postoperative nausea and vomiting)   . Prostate cancer ([place]) 2000  . Shingles   . Shortness of breath   . Sleep apnea    05-02-14 cpap , not yet used- suggested settings 5  . Stroke [facility]) 2013   Speech.    has Hx of lymphoma; Multiple myeloma in remission ([place]); Anxiety state; Essential hypertension; MYOCARDIAL INFARCTION; Coronary atherosclerosis; ASTHMA, UNSPECIFIED;  NEPHROLITHIASIS; ELEVATED PROSTATE SPECIFIC ANTIGEN; Nonspecific (abnormal) findings on radiological and other examination of body structure; ROTATOR CUFF REPAIR, RIGHT, HX OF; Hypogammaglobulinemia ([place]); Lt CVA with expressive aphasia Nov 2013; H/O cardiac pacemaker, Medtronic REVO, MRI conditional device, placed 07/2011 for sympyomatic bradycardia; Hx of bladder cancer; Hyperlipidemia with target LDL less than 100; Prediabetes; Expressive aphasia; Hemiplegia affecting right dominant side ([place]); 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) on his problem list.     is allergic to morphine and related and tape.  Mr. Diel had no medications administered during this visit.  Past Surgical History:  Procedure Laterality Date  . BLADDER SURGERY    . BONE MARROW TRANSPLANT  2011  . COLON SURGERY     colon resection  . COLONOSCOPY N/A 01/01/2013   Procedure: COLONOSCOPY;  Surgeon: Rogene Houston, MD;  [facility] ENDO [facility];  Service: Endoscopy;  Laterality: N/A;  825-moved to [place] notified pt  . CORONARY ANGIOPLASTY  06/24/2000   PCI and stenting in mid & proximal RCA  . heart stents x 5  1999  . INGUINAL HERNIA REPAIR Right 05/04/2014   Procedure: OPEN RIGHT INGUINAL HERNIA REPAIR with mesh;  Surgeon: Edward Jolly, MD;  Location: [facility] ORS;  Service: General;  Laterality: Right;  . INSERT / REPLACE / REMOVE PACEMAKER    . left ear skin cancer removed    . NM MYOCAR PERF WALL MOTION  11/27/2007   inferior  scar  . PACEMAKER INSERTION  07/22/2011   Medtronic  . PORTACATH PLACEMENT  07/26/2009   right chest  . PROSTATE SURGERY    . Rotator    . ROTATOR CUFF REPAIR Right   . SHOULDER ARTHROSCOPY WITH SUBACROMIAL DECOMPRESSION Right 07/21/2013   Procedure: RIGHT SHOULDER ARTHROSCOPY WITH SUBACROMIAL DECOMPRESSION AND DEBRIDEMENT & Injection of Left Shoulder;  Surgeon: Alta Corning, MD;  Location: [place];  Service: Orthopedics;  Laterality: Right;  . TEE WITHOUT CARDIOVERSION  10/13/2012   Procedure: TRANSESOPHAGEAL ECHOCARDIOGRAM (TEE);  Surgeon: Sanda Klein, MD;  Location: [facility] ENDOSCOPY;  Service: Cardiovascular;  Laterality: N/A;  pat/kay/echo notified  . US ECHOCARDIOGRAPHY  06/19/2011   RV mildly dilated,mild to mod. MR,mild AI,mild PI  . WRIST SURGERY     right    Denies any headaches, dizziness, double vision, fevers, chills, night sweats, nausea, vomiting, diarrhea, constipation, chest pain, heart palpitations, shortness of breath, blood in stool, black tarry stool, urinary pain, urinary burning, urinary frequency, hematuria.  14 point review of systems was performed and is negative except as detailed under history of present illness and above   PHYSICAL EXAMINATION ECOG PERFORMANCE STATUS: 1 - Symptomatic but completely ambulatory Vitals -  1 value per visit 7/32/2025  SYSTOLIC 427  DIASTOLIC 65  Pulse 62  Temperature 98.1  Respirations 18    GENERAL:alert, no distress, well nourished, well developed, comfortable, cooperative, smiling and aphasia SKIN: skin color, texture, turgor are normal, no rashes or significant lesions HEAD: Normocephalic, No masses, lesions, tenderness or abnormalities EYES: normal, PERRLA, EOMI, Conjunctiva are pink and non-injected EARS: External ears normal OROPHARYNX:mucous membranes are moist  NECK: supple, no adenopathy, trachea midline LYMPH:  not examined BREAST:not examined LUNGS: clear to auscultation  HEART: regular rate &  rhythm ABDOMEN:abdomen soft and normal bowel sounds No rebound, no guarding. No palpable masses BACK: Back symmetric, no curvature. EXTREMITIES:less then 2 second capillary refill, no joint deformities, effusion, or inflammation, no skin discoloration, no clubbing, no cyanosis  NEURO: alert & oriented x 3 , no focal motor/sensory deficits, gait normal   LABORATORY DATA: I have reviewed the data as listed  CBC    Component Value Date/Time   WBC 6.1 04/29/2016 1021   RBC 4.20 (L) 04/29/2016 1021   HGB 12.4 (L) 04/29/2016 1021   HCT 37.4 (L) 04/29/2016 1021   PLT 158 04/29/2016 1021   MCV 89.0 04/29/2016 1021   MCH 29.5 04/29/2016 1021   MCHC 33.2 04/29/2016 1021   RDW 16.2 (H) 04/29/2016 1021   LYMPHSABS 1.8 04/29/2016 1021   MONOABS 0.9 04/29/2016 1021   EOSABS 0.1 04/29/2016 1021   BASOSABS 0.0 04/29/2016 1021      Chemistry      Component Value Date/Time   NA 136 04/29/2016 1021   K 3.3 (L) 04/29/2016 1021   CL 106 04/29/2016 1021   CO2 24 04/29/2016 1021   BUN 14 04/29/2016 1021   CREATININE 0.98 04/29/2016 1021   CREATININE 0.80 11/07/2014 1034      Component Value Date/Time   CALCIUM 8.5 (L) 04/29/2016 1021   ALKPHOS 59 04/29/2016 1021   AST 23 04/29/2016 1021   ALT 28 04/29/2016 1021   BILITOT 0.6 04/29/2016 1021     Lab Results  Component Value Date   PROT 6.3 (L) 04/29/2016   ALBUMINELP 3.0 04/29/2016   A1GS 0.2 04/29/2016   A2GS 0.8 04/29/2016   BETS 0.9 04/29/2016   BETA2SER 5.5 01/06/2015   GAMS 1.0 04/29/2016   MSPIKE Not Observed 04/29/2016   SPEI Comment 04/29/2016   SPECOM Comment 04/29/2016   IGGSERUM 881 04/29/2016   IGA 287 04/29/2016   IGMSERUM 31 04/29/2016   IMMELINT (NOTE) 01/06/2015   KPAFRELGTCHN 26.2 (H) 04/29/2016   LAMBDASER 22.4 04/29/2016   KAPLAMBRATIO 1.17 04/29/2016   Lab Results  Component Value Date   IRON 99 04/20/2014   TIBC 365 04/20/2014   FERRITIN 54 04/20/2014     ASSESSMENT AND PLAN:  IgG lambda myeloma  on maintenance revlimid since 2011. 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 He tolerates his Revlimid without difficulty ; he is clearly beyond the typical 2 year maintenance, but last seen in 09/2015 at [facility] and advised to continue. He is on IVIG for hypogammaglobulinemia . He tolerates it very well and has had no recent problems with infection. We will continue with monthly therapy.  I'm going to refer him to physical therapy I believe this will help improve his balance  and strength. He has a cane and I have encouraged him to use it.Marland Kitchen  He will return in one month with labs and ongoing follow-up. No evidence of recurrent lymphoma.   All questions were answered. The patient knows to call the clinic with any problems, questions or concerns. We can certainly see the patient much sooner if necessary.  This document serves as a record of services personally performed by Ancil Linsey, MD. It was created on her behalf by Arlyce Harman, a trained medical scribe. The creation of this record is based on the scribe's personal observations and the provider's statements to them. This document has been checked and approved by the attending provider.  I have reviewed the above documentation for accuracy and completeness, and I agree with the above.  Molli Hazard, MD 06/25/2016 11:59 AM

## 2016-05-30 ENCOUNTER — Ambulatory Visit (HOSPITAL_COMMUNITY): Payer: Medicare Other

## 2016-05-30 ENCOUNTER — Encounter (HOSPITAL_COMMUNITY): Payer: Medicare Other | Attending: Oncology | Admitting: Hematology & Oncology

## 2016-05-30 ENCOUNTER — Encounter (HOSPITAL_BASED_OUTPATIENT_CLINIC_OR_DEPARTMENT_OTHER): Payer: Medicare Other

## 2016-05-30 VITALS — BP 133/65 | HR 62 | Temp 98.1°F | Resp 18

## 2016-05-30 DIAGNOSIS — I255 Ischemic cardiomyopathy: Secondary | ICD-10-CM

## 2016-05-30 DIAGNOSIS — D801 Nonfamilial hypogammaglobulinemia: Secondary | ICD-10-CM

## 2016-05-30 DIAGNOSIS — C9000 Multiple myeloma not having achieved remission: Secondary | ICD-10-CM | POA: Diagnosis not present

## 2016-05-30 DIAGNOSIS — R4701 Aphasia: Secondary | ICD-10-CM

## 2016-05-30 DIAGNOSIS — I639 Cerebral infarction, unspecified: Secondary | ICD-10-CM

## 2016-05-30 DIAGNOSIS — M25511 Pain in right shoulder: Secondary | ICD-10-CM

## 2016-05-30 DIAGNOSIS — C9001 Multiple myeloma in remission: Secondary | ICD-10-CM | POA: Insufficient documentation

## 2016-05-30 DIAGNOSIS — Z8572 Personal history of non-Hodgkin lymphomas: Secondary | ICD-10-CM

## 2016-05-30 MED ORDER — IMMUNE GLOBULIN (HUMAN) 10 GM/100ML IV SOLN: 0.4000 g/kg | Freq: Once | INTRAVENOUS | Status: DC

## 2016-05-30 MED ORDER — SODIUM CHLORIDE 0.9 % IJ SOLN: 10.0000 mL | INTRAMUSCULAR | Status: DC | PRN

## 2016-05-30 MED ORDER — PREGABALIN 75 MG PO CAPS: ORAL_CAPSULE | ORAL | Status: DC

## 2016-05-30 MED ORDER — HYDROCODONE-ACETAMINOPHEN 5-325 MG PO TABS: 1.0000 | ORAL_TABLET | Freq: Two times a day (BID) | ORAL | Status: DC | PRN

## 2016-05-30 MED ORDER — HEPARIN SOD (PORK) LOCK FLUSH 100 UNIT/ML IV SOLN
500.0000 [IU] | Freq: Once | INTRAVENOUS | Status: AC | PRN
  Administered 2016-05-30: 500 [IU]

## 2016-05-30 MED ORDER — IMMUNE GLOBULIN (HUMAN) 20 GM/200ML IV SOLN
400.0000 mg/kg | Freq: Once | INTRAVENOUS | Status: AC
  Administered 2016-05-30: 35 g via INTRAVENOUS
  Filled 2016-05-30: qty 50

## 2016-05-30 MED ORDER — ACETAMINOPHEN 325 MG PO TABS
650.0000 mg | ORAL_TABLET | Freq: Four times a day (QID) | ORAL | Status: DC | PRN
  Administered 2016-05-30: 650 mg via ORAL

## 2016-05-30 MED ORDER — HYDROCODONE-ACETAMINOPHEN 10-325 MG PO TABS: 1.0000 | ORAL_TABLET | ORAL | Status: DC | PRN

## 2016-05-30 MED ORDER — ACETAMINOPHEN 325 MG PO TABS
ORAL_TABLET | ORAL | Status: AC
  Filled 2016-05-30: qty 2

## 2016-05-30 MED ORDER — DEXTROSE 5 % IV SOLN
INTRAVENOUS | Status: DC
  Administered 2016-05-30: 11:00:00 via INTRAVENOUS

## 2016-05-30 MED ORDER — SODIUM CHLORIDE 0.9 % IV SOLN
Freq: Once | INTRAVENOUS | Status: DC
Start: 2016-05-30 — End: 2016-05-30

## 2016-05-30 NOTE — Patient Instructions (Signed)
Jeremy Parrish at [facility] Discharge Instructions  RECOMMENDATIONS MADE BY THE CONSULTANT AND ANY TEST RESULTS WILL BE SENT TO YOUR REFERRING PHYSICIAN.  IVIG today.    Thank you for choosing [place] at [facility] to provide your oncology and hematology care.  To afford each patient quality time with our provider, please arrive at least 15 minutes before your scheduled appointment time.   Beginning January 23rd 2017 lab work for the [name] will be done in the  Main lab at [facility] on 1st floor. If you have a lab appointment with the [facility] please come in thru the  Main Entrance and check in at the main information desk  You need to re-schedule your appointment should you arrive 10 or more minutes late.  We strive to give you quality time with our providers, and arriving late affects you and other patients whose appointments are after yours.  Also, if you no show three or more times for appointments you may be dismissed from the clinic at the providers discretion.     Again, thank you for choosing [facility].  Our hope is that these requests will decrease the amount of time that you wait before being seen by our physicians.       _____________________________________________________________  Should you have questions after your visit to [facility], please contact our office at [phone] between the hours of 8:30 a.m. and 4:30 p.m.  Voicemails left after 4:30 p.m. will not be returned until the following business day.  For prescription refill requests, have your pharmacy contact our office.         Resources For Cancer Patients and their Caregivers ? American Cancer Society: Can assist with transportation, wigs, general needs, runs Look Good Feel Better.        (681)698-8147 ? Cancer Care: Provides financial assistance, online support groups, medication/co-pay assistance.  1-800-813-HOPE  (614)520-3506) ? [place] Assists [place] cancer patients and their families through emotional , educational and financial support.  409-019-4311 ? [place] DSS Where to apply for food stamps, Medicaid and utility assistance. 7851306665 ? [facility]: Transportation to medical appointments. (713)039-8229 ? Social Security Administration: May apply for disability if have a Stage IV cancer. 971-516-3069 442-662-3982 ? [employer], Disability and Transit Services: Assists with nutrition, care and transit needs. [place] Support Programs: @10RELATIVEDAYS @ > Cancer Support Group  2nd Tuesday of the month 1pm-2pm, Journey Room  > Creative Journey  3rd Tuesday of the month 1130am-1pm, Journey Room  > Look Good Feel Better  1st Wednesday of the month 10am-12 noon, Journey Room (Call Start to register [phone])

## 2016-05-30 NOTE — Progress Notes (Signed)
Patient tolerated infusion well.  VSS throughout.   

## 2016-05-30 NOTE — Patient Instructions (Signed)
[place] at [facility] Discharge Instructions  RECOMMENDATIONS MADE BY THE CONSULTANT AND ANY TEST RESULTS WILL BE SENT TO YOUR REFERRING PHYSICIAN.  You saw Dr. Whitney Muse today. Return to clinic for scheduled IVIG. Return to clinic in 2 months for labs and follow up visit.  Thank you for choosing [place] at [facility] to provide your oncology and hematology care.  To afford each patient quality time with our provider, please arrive at least 15 minutes before your scheduled appointment time.   Beginning January 23rd 2017 lab work for the [name] will be done in the  Main lab at [facility] on 1st floor. If you have a lab appointment with the [facility] please come in thru the  Main Entrance and check in at the main information desk  You need to re-schedule your appointment should you arrive 10 or more minutes late.  We strive to give you quality time with our providers, and arriving late affects you and other patients whose appointments are after yours.  Also, if you no show three or more times for appointments you may be dismissed from the clinic at the providers discretion.     Again, thank you for choosing [facility].  Our hope is that these requests will decrease the amount of time that you wait before being seen by our physicians.       _____________________________________________________________  Should you have questions after your visit to [facility], please contact our office at [phone] between the hours of 8:30 a.m. and 4:30 p.m.  Voicemails left after 4:30 p.m. will not be returned until the following business day.  For prescription refill requests, have your pharmacy contact our office.         Resources For Cancer Patients and their Caregivers ? American Cancer Society: Can assist with transportation, wigs, general needs, runs Look Good Feel Better.        628-256-8597 ? Cancer  Care: Provides financial assistance, online support groups, medication/co-pay assistance.  1-800-813-HOPE 817 251 6228) ? [place] Assists [place] cancer patients and their families through emotional , educational and financial support.  906-544-0922 ? [place] DSS Where to apply for food stamps, Medicaid and utility assistance. 216 042 9565 ? RCATS: Transportation to medical appointments. 631-354-4084 ? Social Security Administration: May apply for disability if have a Stage IV cancer. 410-841-3192 [phone] ? [employer], Disability and Transit Services: Assists with nutrition, care and transit needs. [place] Support Programs: @10RELATIVEDAYS @ > Cancer Support Group  2nd Tuesday of the month 1pm-2pm, Journey Room  > Creative Journey  3rd Tuesday of the month 1130am-1pm, Journey Room  > Look Good Feel Better  1st Wednesday of the month 10am-12 noon, Journey Room (Call [place] to register [phone])

## 2016-06-03 MED ORDER — DIPHENHYDRAMINE HCL 25 MG PO CAPS
ORAL_CAPSULE | ORAL | Status: AC
  Filled 2016-06-03: qty 1

## 2016-06-03 MED ORDER — DIPHENHYDRAMINE HCL 50 MG/ML IJ SOLN
INTRAMUSCULAR | Status: AC
  Filled 2016-06-03: qty 1

## 2016-06-03 MED ORDER — ACETAMINOPHEN 325 MG PO TABS
ORAL_TABLET | ORAL | Status: AC
  Filled 2016-06-03: qty 2

## 2016-06-06 ENCOUNTER — Other Ambulatory Visit: Payer: Self-pay | Admitting: Family Medicine

## 2016-06-10 ENCOUNTER — Ambulatory Visit (INDEPENDENT_AMBULATORY_CARE_PROVIDER_SITE_OTHER): Payer: Medicare Other | Admitting: Family Medicine

## 2016-06-10 ENCOUNTER — Encounter: Payer: Self-pay | Admitting: Family Medicine

## 2016-06-10 VITALS — BP 116/74 | Temp 98.1°F | Ht 68.0 in | Wt 188.0 lb

## 2016-06-10 DIAGNOSIS — H8110 Benign paroxysmal vertigo, unspecified ear: Secondary | ICD-10-CM

## 2016-06-10 DIAGNOSIS — I2510 Atherosclerotic heart disease of native coronary artery without angina pectoris: Secondary | ICD-10-CM | POA: Diagnosis not present

## 2016-06-10 MED ORDER — MECLIZINE HCL 25 MG PO TABS: 25.0000 mg | ORAL_TABLET | Freq: Three times a day (TID) | ORAL | 0 refills | Status: DC | PRN

## 2016-06-10 NOTE — Progress Notes (Signed)
   Subjective:    Patient ID: Jeremy Parrish, male    DOB: [birth year], 75 y.o.   MRN: [id number]  Dizziness  This is a new problem. Episode onset: 3 -4 days ago. The problem occurs intermittently. Associated symptoms include vertigo. Exacerbated by: movement. He has tried nothing for the symptoms.   No sis nuasea, no headache  No major hx  Hitting with change in position   No headache no loss of consciousness. Patient did drive today and feels he is safe to do so. Dizziness transient vertigo-like spinning occurs with sudden change of head and neck position mostly rising Review of Systems  Neurological: Positive for dizziness and vertigo.       Objective:   Physical Exam  Alert vital stable blood pressure good no acute distress baseline speech aphasia HEENT otherwise normal negative cerebellar findings neuro intact      Assessment & Plan:  Impression acute vertigo likely in her ear discussed plan Antivert 25 3 times a day when necessary. Use caution when driving ambulating etc. expect gradual resolution persists call back

## 2016-06-21 ENCOUNTER — Other Ambulatory Visit (HOSPITAL_COMMUNITY): Payer: Self-pay | Admitting: Oncology

## 2016-06-21 DIAGNOSIS — C9001 Multiple myeloma in remission: Secondary | ICD-10-CM

## 2016-06-21 MED ORDER — LENALIDOMIDE 10 MG PO CAPS: ORAL_CAPSULE | ORAL | 2 refills | Status: DC

## 2016-06-23 ENCOUNTER — Other Ambulatory Visit: Payer: Self-pay | Admitting: Cardiovascular Disease

## 2016-06-24 ENCOUNTER — Telehealth: Payer: Self-pay | Admitting: Cardiovascular Disease

## 2016-06-24 ENCOUNTER — Encounter (HOSPITAL_COMMUNITY): Payer: Self-pay | Admitting: Hematology & Oncology

## 2016-06-25 ENCOUNTER — Encounter (HOSPITAL_COMMUNITY): Payer: Self-pay | Admitting: Hematology & Oncology

## 2016-06-26 ENCOUNTER — Ambulatory Visit (INDEPENDENT_AMBULATORY_CARE_PROVIDER_SITE_OTHER): Payer: Medicare Other | Admitting: Cardiovascular Disease

## 2016-06-26 ENCOUNTER — Encounter: Payer: Self-pay | Admitting: Cardiovascular Disease

## 2016-06-26 VITALS — BP 110/50 | HR 62 | Ht 68.0 in | Wt 186.2 lb

## 2016-06-26 DIAGNOSIS — I480 Paroxysmal atrial fibrillation: Secondary | ICD-10-CM

## 2016-06-26 DIAGNOSIS — Z8679 Personal history of other diseases of the circulatory system: Secondary | ICD-10-CM | POA: Diagnosis not present

## 2016-06-26 DIAGNOSIS — Z79899 Other long term (current) drug therapy: Secondary | ICD-10-CM | POA: Diagnosis not present

## 2016-06-26 DIAGNOSIS — Z950 Presence of cardiac pacemaker: Secondary | ICD-10-CM

## 2016-06-26 DIAGNOSIS — I10 Essential (primary) hypertension: Secondary | ICD-10-CM

## 2016-06-26 DIAGNOSIS — E785 Hyperlipidemia, unspecified: Secondary | ICD-10-CM

## 2016-06-26 DIAGNOSIS — I2510 Atherosclerotic heart disease of native coronary artery without angina pectoris: Secondary | ICD-10-CM | POA: Diagnosis not present

## 2016-06-26 NOTE — Assessment & Plan Note (Signed)
History of hypertension blood pressure measured at 110/50. He is on amlodipine. Continue current meds at current dosing

## 2016-06-26 NOTE — Assessment & Plan Note (Signed)
History of hyperlipidemia apparently on simvastatin with most recent lipid profile performed 12/27/14 revealing total cholesterol 104, LDL 49 and HDL of 85. We will repeat a lipid and liver profile

## 2016-06-26 NOTE — Assessment & Plan Note (Signed)
History of coronary artery disease status post RCA and circumflex stenting by myself back in 1996. Performed 04/10/06 revealed patent stents with 30% proximal LAD lesion and an EF of 45%. He has chronic dyspnea on exertion but denies chest pain.

## 2016-06-26 NOTE — Assessment & Plan Note (Signed)
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 Sallyanne Kuster.

## 2016-06-26 NOTE — Patient Instructions (Signed)
Medication Instructions:  Your physician recommends that you continue on your current medications as directed. Please refer to the Current Medication list given to you today.   Labwork: Your physician recommends that you return for lab work at your [place] will need to be fasting. SO DO NOT EAT OR DRINK PAST MIDNIGHT THE MORNING YOU HAVE THE LAB WORK DONE.   Follow-Up: Your physician wants you to follow-up in: [place]. You will receive a reminder letter in the mail two months in advance. If you don't receive a letter, please call our office to schedule the follow-up appointment.  If you need a refill on your cardiac medications before your next appointment, please call your pharmacy.

## 2016-06-26 NOTE — Progress Notes (Signed)
06/26/2016 Jeremy Parrish   [birth year]  [id number]  Primary Physician [name], MD Primary Cardiologist: Lorretta Harp MD [name]  HPI:  The patient is a delightful 75 year old, mildly overweight, married Caucasian male, father of 29, grandfather to 4 grandchildren who I saw 04/14/15... He has a history of CAD dating back to 16. I intervened on him multiple times and have put stents in his RCA and circumflex coronary arteries. His last cath performed Apr 10, 2006, revealed patent stents with a 30% proximal LAD lesion and an EF of 45%. He does have chronic dyspnea but denies chest pain. His other problems include hypertension and hyperlipidemia. In addition, he does have bladder cancer as well as lymphoma, myeloma. He has had a partial colectomy. 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 Sallyanne Kuster with improvement in his symptoms. He was last seen in the office for interrogation January 02, 2012  I saw him one year ago. He complains of increasing dyspnea and occasional chest pain. His last 2-D echo was performed 2 years ago and he had a hypokinetic lateral wall of his left Myoview stress test was performed 11/27/07. He is scheduled for right rotator cuff surgery in the near future. I obtained a Myoview stress test a 2-D echo revealing inferolateral scar unchanged from prior functional studies and moderately reduced LVEF of 35-40%.based on the results of his Myoview I cleared him for his rotator cuff surgeryAt moderately increased risk. He saw Kerin Ransom back in the office in January 2015  complaining of chest pain. Again the Myoview was intermediate risk because of scar with moderate LV dysfunction. He was placed on by mouth Imdur resulting in improvement in his chest pain. Because of symptoms of  obstructive sleep apnea I obtain a sleep study which was abnormal. He was wearing C Pap however currently he has not. His major complaints are of chronic dyspnea which does not seem to have changed in severity and some orthostatic dizziness.   Current Outpatient Prescriptions  Medication Sig Dispense Refill  . albuterol (PROVENTIL HFA;VENTOLIN HFA) 108 (90 Base) MCG/ACT inhaler Inhale 2 puffs into the lungs every 6 (six) hours as needed for wheezing or shortness of breath. 1 Inhaler 3  . albuterol (PROVENTIL) (2.5 MG/3ML) 0.083% nebulizer solution Use via neb q 4 hours prn wheezing 75 vial 2  . ALPRAZolam (XANAX) 0.5 MG tablet Take 1-2 tablets (0.5-1 mg total) by mouth at bedtime as needed for anxiety. 90 tablet 1  . amLODipine (NORVASC) 10 MG tablet TAKE 1 TABLET (10 MG TOTAL) BY MOUTH EVERY MORNING. 90 tablet 0  . budesonide (PULMICORT) 0.5 MG/2ML nebulizer solution Take 2 mLs (0.5 mg total) by nebulization 2 (two) times daily. 120 mL 2  . chlorzoxazone (PARAFON) 500 MG tablet Take 1 tablet (500 mg total) by mouth 3 (three) times daily as needed for muscle spasms. 60 tablet 2  . citalopram (CELEXA) 40 MG tablet TAKE 1 TABLET (40 MG TOTAL) BY MOUTH DAILY. 90 tablet 0  . clopidogrel (PLAVIX) 75 MG tablet TAKE 1 TABLET BY MOUTH EVERY DAY 90 tablet 3  . ELIQUIS 5 MG TABS tablet TAKE 1 TABLET (5 MG TOTAL) BY MOUTH 2 (TWO) TIMES DAILY. 60 tablet 5  . HYDROcodone-acetaminophen (NORCO) 10-325 MG tablet Take 1 tablet by mouth every 4 (four)  hours as needed. 60 tablet 0  . HYDROcodone-acetaminophen (NORCO/VICODIN) 5-325 MG tablet Take 1 tablet by mouth 2 (two) times daily as needed. 60 tablet 0  . ketoconazole (NIZORAL) 2 % cream Apply 1 application topically 2 (two) times daily. 30 g 4  . KLOR-CON M20 20 MEQ tablet Take 1 tablet (20 mEq total) by mouth 2 (two) times daily. Reported on 11/15/2015 180 tablet 2  . lenalidomide (REVLIMID) 10 MG capsule TAKE 1 CAPSULE DAILY FOR 7 DAYS ON FOLLOWED BY 7 DAYS OFF,  REPEAT CYCLE 14 capsule 2  . levocetirizine (XYZAL) 5 MG tablet Take 5 mg by mouth every evening.    . meclizine (ANTIVERT) 25 MG tablet Take 1 tablet (25 mg total) by mouth 3 (three) times daily as needed for dizziness. 30 tablet 0  . nitroGLYCERIN (NITROSTAT) 0.4 MG SL tablet Place 1 tablet (0.4 mg total) under the tongue every 5 (five) minutes as needed for chest pain. 25 tablet 0  . omeprazole (PRILOSEC) 20 MG capsule TAKE 1 CAPSULE (20 MG TOTAL) BY MOUTH DAILY. 90 capsule 1  . simvastatin (ZOCOR) 20 MG tablet Take 1 tablet (20 mg total) by mouth at bedtime. 90 tablet 1   No current facility-administered medications for this visit.    Facility-Administered Medications Ordered in Other Visits  Medication Dose Route Frequency Provider Last Rate Last Dose  . acetaminophen (TYLENOL) tablet 650 mg  650 mg Oral Q6H PRN Baird Cancer, PA-C   650 mg at 12/09/14 0855  . sodium chloride 0.9 % injection 10 mL  10 mL Intracatheter PRN Baird Cancer, PA-C   10 mL at 12/09/14 0856    Allergies  Allergen Reactions  . Morphine And Related Other (See Comments)    hallucinations  . Tape Rash    Paper tape is ok    Social History   Social History  . Marital status: Married    Spouse name: [name]  . Number of children: 3  . Years of education: 9th   Occupational History  . retired     Social History Main Topics  . Smoking status: Former Smoker    Packs/day: 1.00    Years: 20.00    Types: Cigarettes    Quit date: 11/14/1994  . Smokeless tobacco: Never Used  . Alcohol use No     Comment: previously drank but none for at least 15 years.  . Drug use: No  . Sexual activity: Not on file   Other Topics Concern  . Not on file   Social History Narrative   Patient lives at home spouse.   Caffeine Use: Occasionally     Review of Systems: General: negative for chills, fever, night sweats or weight changes.  Cardiovascular: negative for chest pain, dyspnea on exertion, edema,  orthopnea, palpitations, paroxysmal nocturnal dyspnea or shortness of breath Dermatological: negative for rash Respiratory: negative for cough or wheezing Urologic: negative for hematuria Abdominal: negative for nausea, vomiting, diarrhea, bright red blood per rectum, melena, or hematemesis Neurologic: negative for visual changes, syncope, or dizziness All other systems reviewed and are otherwise negative except as noted above.    Blood pressure (!) 110/50, pulse 62, height 5\' 8"  (1.727 m), weight 186 lb 3.2 oz (84.5 kg).  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 Sallyanne Kuster.  Essential hypertension History of hypertension blood pressure measured at 110/50. He is on amlodipine. Continue current meds at current dosing  Coronary atherosclerosis History of coronary artery disease status post RCA and circumflex stenting by myself back in 1996. Performed 04/10/06 revealed patent stents with 30% proximal LAD lesion and an EF of 45%. He has chronic dyspnea on exertion but denies chest pain.  Hyperlipidemia with target LDL less than 100 History of hyperlipidemia apparently on simvastatin with most recent lipid profile performed 12/27/14 revealing total cholesterol 104, LDL 49 and HDL of 85. We will repeat a lipid and liver profile  Paroxysmal atrial fibrillation (HCC) History of proximal atrial fibrillation  status post remote stroke on Eliquis oral anticoagulation.      Lorretta Harp MD FACP,FACC,FAHA, [facility] 06/26/2016 3:23 PM

## 2016-06-26 NOTE — Assessment & Plan Note (Signed)
History of proximal atrial fibrillation status post remote stroke on Eliquis oral anticoagulation.

## 2016-06-27 ENCOUNTER — Encounter (HOSPITAL_COMMUNITY): Payer: Medicare Other | Attending: Oncology

## 2016-06-27 ENCOUNTER — Other Ambulatory Visit (HOSPITAL_COMMUNITY): Payer: Self-pay | Admitting: Oncology

## 2016-06-27 ENCOUNTER — Other Ambulatory Visit (HOSPITAL_COMMUNITY)
Admission: RE | Admit: 2016-06-27 | Discharge: 2016-06-27 | Disposition: A | Discharge status: Home / Self Care | Payer: Medicare Other | Source: Ambulatory Visit | Attending: Cardiovascular Disease | Admitting: Cardiovascular Disease

## 2016-06-27 VITALS — BP 133/70 | HR 63 | Temp 97.6°F | Resp 18 | Wt 186.4 lb

## 2016-06-27 DIAGNOSIS — E785 Hyperlipidemia, unspecified: Secondary | ICD-10-CM | POA: Diagnosis not present

## 2016-06-27 DIAGNOSIS — D801 Nonfamilial hypogammaglobulinemia: Secondary | ICD-10-CM | POA: Diagnosis present

## 2016-06-27 DIAGNOSIS — C9000 Multiple myeloma not having achieved remission: Secondary | ICD-10-CM | POA: Diagnosis not present

## 2016-06-27 DIAGNOSIS — Z79899 Other long term (current) drug therapy: Secondary | ICD-10-CM | POA: Insufficient documentation

## 2016-06-27 DIAGNOSIS — C9001 Multiple myeloma in remission: Secondary | ICD-10-CM | POA: Insufficient documentation

## 2016-06-27 DIAGNOSIS — E876 Hypokalemia: Secondary | ICD-10-CM

## 2016-06-27 LAB — COMPREHENSIVE METABOLIC PANEL
ALK PHOS: 52 U/L (ref 38–126)
ALT: 14 U/L — ABNORMAL LOW (ref 17–63)
ANION GAP: 5 (ref 5–15)
AST: 19 U/L (ref 15–41)
Albumin: 3.7 g/dL (ref 3.5–5.0)
BUN: 9 mg/dL (ref 6–20)
CHLORIDE: 106 mmol/L (ref 101–111)
CO2: 25 mmol/L (ref 22–32)
CREATININE: 1 mg/dL (ref 0.61–1.24)
Calcium: 8.6 mg/dL — ABNORMAL LOW (ref 8.9–10.3)
GFR calc non Af Amer: >60 mL/min (ref >60)
Glucose, Bld: 108 mg/dL — ABNORMAL HIGH (ref 65–99)
Potassium: 3.6 mmol/L (ref 3.5–5.1)
SODIUM: 136 mmol/L (ref 135–145)
TOTAL PROTEIN: 6.6 g/dL (ref 6.5–8.1)
Total Bilirubin: 0.9 mg/dL (ref 0.3–1.2)

## 2016-06-27 LAB — CBC WITH DIFFERENTIAL/PLATELET
Basophils Absolute: 0 10*3/uL (ref 0.0–0.1)
Basophils Relative: 1 %
EOS ABS: 0.2 10*3/uL (ref 0.0–0.7)
EOS PCT: 4 %
HCT: 37 % — ABNORMAL LOW (ref 39.0–52.0)
Hemoglobin: 12.1 g/dL — ABNORMAL LOW (ref 13.0–17.0)
LYMPHS ABS: 1.8 10*3/uL (ref 0.7–4.0)
Lymphocytes Relative: 43 %
MCH: 29.2 pg (ref 26.0–34.0)
MCHC: 32.7 g/dL (ref 30.0–36.0)
MCV: 89.4 fL (ref 78.0–100.0)
MONOS PCT: 10 %
Monocytes Absolute: 0.4 10*3/uL (ref 0.1–1.0)
Neutro Abs: 1.8 10*3/uL (ref 1.7–7.7)
Neutrophils Relative %: 42 %
PLATELETS: 132 10*3/uL — AB (ref 150–400)
RBC: 4.14 MIL/uL — ABNORMAL LOW (ref 4.22–5.81)
RDW: 16.4 % — ABNORMAL HIGH (ref 11.5–15.5)
WBC: 4.2 10*3/uL (ref 4.0–10.5)

## 2016-06-27 LAB — LIPID PANEL
Cholesterol: 93 mg/dL (ref 0–200)
HDL: 26 mg/dL — AB (ref >40)
LDL CALC: 45 mg/dL (ref 0–99)
TRIGLYCERIDES: 109 mg/dL (ref <150)
Total CHOL/HDL Ratio: 3.6 RATIO
VLDL: 22 mg/dL (ref 0–40)

## 2016-06-27 LAB — HEPATIC FUNCTION PANEL
ALBUMIN: 3.8 g/dL (ref 3.5–5.0)
ALT: 15 U/L — ABNORMAL LOW (ref 17–63)
AST: 20 U/L (ref 15–41)
Alkaline Phosphatase: 57 U/L (ref 38–126)
Bilirubin, Direct: 0.1 mg/dL (ref 0.1–0.5)
Indirect Bilirubin: 0.6 mg/dL (ref 0.3–0.9)
Total Bilirubin: 0.7 mg/dL (ref 0.3–1.2)
Total Protein: 6.8 g/dL (ref 6.5–8.1)

## 2016-06-27 MED ORDER — IMMUNE GLOBULIN (HUMAN) 10 GM/100ML IV SOLN
0.4000 g/kg | Freq: Once | INTRAVENOUS | Status: AC
  Administered 2016-06-27: 35 g via INTRAVENOUS
  Filled 2016-06-27: qty 50

## 2016-06-27 MED ORDER — DEXTROSE 5 % IV SOLN
INTRAVENOUS | Status: DC
  Administered 2016-06-27: 10:00:00 via INTRAVENOUS

## 2016-06-27 MED ORDER — HEPARIN SOD (PORK) LOCK FLUSH 100 UNIT/ML IV SOLN
500.0000 [IU] | Freq: Once | INTRAVENOUS | Status: AC | PRN
  Administered 2016-06-27: 500 [IU]
  Filled 2016-06-27: qty 5

## 2016-06-27 MED ORDER — SODIUM CHLORIDE 0.9 % IJ SOLN
10.0000 mL | INTRAMUSCULAR | Status: DC | PRN
  Administered 2016-06-27: 10 mL
  Filled 2016-06-27: qty 10

## 2016-06-27 MED ORDER — ACETAMINOPHEN 325 MG PO TABS
650.0000 mg | ORAL_TABLET | Freq: Four times a day (QID) | ORAL | Status: DC | PRN
  Administered 2016-06-27: 650 mg via ORAL
  Filled 2016-06-27: qty 2

## 2016-06-27 MED ORDER — SODIUM CHLORIDE 0.9 % IV SOLN
Freq: Once | INTRAVENOUS | Status: DC
Start: 2016-06-27 — End: 2016-06-27

## 2016-06-27 MED ORDER — KLOR-CON M20 20 MEQ PO TBCR: 20.0000 meq | EXTENDED_RELEASE_TABLET | Freq: Three times a day (TID) | ORAL | 2 refills | Status: DC

## 2016-06-27 NOTE — Patient Instructions (Signed)
[place] at [facility] Discharge Instructions  RECOMMENDATIONS MADE BY THE CONSULTANT AND ANY TEST RESULTS WILL BE SENT TO YOUR REFERRING PHYSICIAN.  IVIG given today. Follow-up as scheduled. Call clinic for any questions or concerns  Thank you for choosing [place] at [facility] to provide your oncology and hematology care.  To afford each patient quality time with our provider, please arrive at least 15 minutes before your scheduled appointment time.   Beginning January 23rd 2017 lab work for the [name] will be done in the  Main lab at [facility] on 1st floor. If you have a lab appointment with the [place] [facility] please come in thru the  Main Entrance and check in at the main information desk  You need to re-schedule your appointment should you arrive 10 or more minutes late.  We strive to give you quality time with our providers, and arriving late affects you and other patients whose appointments are after yours.  Also, if you no show three or more times for appointments you may be dismissed from the clinic at the providers discretion.     Again, thank you for choosing [facility].  Our hope is that these requests will decrease the amount of time that you wait before being seen by our physicians.       _____________________________________________________________  Should you have questions after your visit to [facility], please contact our office at [phone] between the hours of 8:30 a.m. and 4:30 p.m.  Voicemails left after 4:30 p.m. will not be returned until the following business day.  For prescription refill requests, have your pharmacy contact our office.         Resources For Cancer Patients and their Caregivers ? American Cancer Society: Can assist with transportation, wigs, general needs, runs Look Good Feel Better.        5513814048 ? Cancer Care: Provides financial assistance,  online support groups, medication/co-pay assistance.  1-800-813-HOPE 774 641 9795) ? [place] Assists [place] cancer patients and their families through emotional , educational and financial support.  (754)386-7392 ? [place] DSS Where to apply for food stamps, Medicaid and utility assistance. 548 073 3414 ? RCATS: Transportation to medical appointments. 252-468-4383 ? Social Security Administration: May apply for disability if have a Stage IV cancer. (913)187-9193 409-298-2714 ? [employer], Disability and Transit Services: Assists with nutrition, care and transit needs. [place] Support Programs: @10RELATIVEDAYS @ > Cancer Support Group  2nd Tuesday of the month 1pm-2pm, Journey Room  > Creative Journey  3rd Tuesday of the month 1130am-1pm, Journey Room  > Look Good Feel Better  1st Wednesday of the month 10am-12 noon, Journey Room (Call [place] to register [phone])

## 2016-06-27 NOTE — Progress Notes (Signed)
Jeremy Parrish tolerated IVIG well without complaints. Pt discharged self ambulatory in satisfactory condition. VSS

## 2016-06-28 LAB — KAPPA/LAMBDA LIGHT CHAINS
KAPPA FREE LGHT CHN: 28.7 mg/L — AB (ref 3.3–19.4)
KAPPA, LAMDA LIGHT CHAIN RATIO: 1.18 (ref 0.26–1.65)
LAMDA FREE LIGHT CHAINS: 24.4 mg/L (ref 5.7–26.3)

## 2016-06-28 LAB — IGG, IGA, IGM
IGM, SERUM: 30 mg/dL (ref 15–143)
IgA: 227 mg/dL (ref 61–437)
IgG (Immunoglobin G), Serum: 821 mg/dL (ref 700–1600)

## 2016-07-01 ENCOUNTER — Encounter: Payer: Self-pay | Admitting: Family Medicine

## 2016-07-01 ENCOUNTER — Encounter: Payer: Self-pay | Admitting: *Deleted

## 2016-07-01 ENCOUNTER — Ambulatory Visit (INDEPENDENT_AMBULATORY_CARE_PROVIDER_SITE_OTHER): Payer: Medicare Other | Admitting: Family Medicine

## 2016-07-01 ENCOUNTER — Telehealth: Payer: Self-pay | Admitting: Family Medicine

## 2016-07-01 VITALS — BP 132/86 | Ht 68.0 in | Wt 185.0 lb

## 2016-07-01 DIAGNOSIS — M7022 Olecranon bursitis, left elbow: Secondary | ICD-10-CM

## 2016-07-01 DIAGNOSIS — I2510 Atherosclerotic heart disease of native coronary artery without angina pectoris: Secondary | ICD-10-CM

## 2016-07-01 LAB — IMMUNOFIXATION ELECTROPHORESIS
IgA: 232 mg/dL (ref 61–437)
IgG (Immunoglobin G), Serum: 816 mg/dL (ref 700–1600)
IgM, Serum: 31 mg/dL (ref 15–143)
Total Protein ELP: 6.5 g/dL (ref 6.0–8.5)

## 2016-07-01 LAB — PROTEIN ELECTROPHORESIS, SERUM
A/G Ratio: 1.3 (ref 0.7–1.7)
ALPHA-1-GLOBULIN: 0.2 g/dL (ref 0.0–0.4)
ALPHA-2-GLOBULIN: 0.8 g/dL (ref 0.4–1.0)
Albumin ELP: 3.7 g/dL (ref 2.9–4.4)
BETA GLOBULIN: 0.8 g/dL (ref 0.7–1.3)
GAMMA GLOBULIN: 1 g/dL (ref 0.4–1.8)
GLOBULIN, TOTAL: 2.8 g/dL (ref 2.2–3.9)
M-SPIKE, %: 0.3 g/dL — AB
Total Protein ELP: 6.5 g/dL (ref 6.0–8.5)

## 2016-07-01 NOTE — Telephone Encounter (Signed)
Pt wants to know when can he get his shingles shot. Please advise.

## 2016-07-01 NOTE — Telephone Encounter (Signed)
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

## 2016-07-01 NOTE — Patient Instructions (Signed)
This is olecranon bursitis

## 2016-07-01 NOTE — Progress Notes (Signed)
   Subjective:    Patient ID: Jeremy Parrish, male    DOB: [birth year], 75 y.o.   MRN: [id number]  HPI  Patient arrives with c/o fluid on left elbow for 3-4 months.He recalls no injury. Not painful. No swelling other than at the site. No fever. No discharge. No trauma  Review of Systems No headache, no major weight loss or weight gain, no chest pain no back pain abdominal pain no change in bowel habits complete ROS otherwise negative     Objective:   Physical Exam Left elbow soft Alert vitals stable, NAD. Blood pressure good on repeat. HEENT normal. Lungs clear. Heart regular rate and rhythm.  palpable Jeremy Parrish bursitis       Assessment & Plan:  Impression Jeremy Parrish bursitis discussed at length multiple questions answered plan reassurance no testing no medications expect slow resolution

## 2016-07-02 NOTE — Telephone Encounter (Signed)
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 Patient verbalized understanding.

## 2016-07-03 ENCOUNTER — Ambulatory Visit (INDEPENDENT_AMBULATORY_CARE_PROVIDER_SITE_OTHER): Payer: Medicare Other | Admitting: *Deleted

## 2016-07-03 ENCOUNTER — Telehealth: Payer: Self-pay | Admitting: Cardiology

## 2016-07-03 DIAGNOSIS — R001 Bradycardia, unspecified: Secondary | ICD-10-CM

## 2016-07-03 NOTE — Telephone Encounter (Signed)
Spoke with pt and reminded pt of remote transmission that is due today. Pt verbalized understanding.   

## 2016-07-03 NOTE — Progress Notes (Signed)
Remote pacemaker transmission.   

## 2016-07-04 ENCOUNTER — Emergency Department (HOSPITAL_COMMUNITY): Payer: Medicare Other

## 2016-07-04 ENCOUNTER — Encounter (HOSPITAL_COMMUNITY): Payer: Self-pay | Admitting: Emergency Medicine

## 2016-07-04 ENCOUNTER — Emergency Department (HOSPITAL_COMMUNITY)
Admission: EM | Admit: 2016-07-04 | Discharge: 2016-07-05 | Disposition: A | Discharge status: Other Acute Inpatient Hospital | Payer: Medicare Other | Attending: Emergency Medicine | Admitting: Emergency Medicine

## 2016-07-04 DIAGNOSIS — W01198A Fall on same level from slipping, tripping and stumbling with subsequent striking against other object, initial encounter: Secondary | ICD-10-CM | POA: Insufficient documentation

## 2016-07-04 DIAGNOSIS — Z7901 Long term (current) use of anticoagulants: Secondary | ICD-10-CM | POA: Insufficient documentation

## 2016-07-04 DIAGNOSIS — S2232XA Fracture of one rib, left side, initial encounter for closed fracture: Secondary | ICD-10-CM | POA: Diagnosis not present

## 2016-07-04 DIAGNOSIS — S0232XA Fracture of orbital floor, left side, initial encounter for closed fracture: Secondary | ICD-10-CM | POA: Diagnosis not present

## 2016-07-04 DIAGNOSIS — Y92008 Other place in unspecified non-institutional (private) residence as the place of occurrence of the external cause: Secondary | ICD-10-CM | POA: Diagnosis not present

## 2016-07-04 DIAGNOSIS — S0083XA Contusion of other part of head, initial encounter: Secondary | ICD-10-CM

## 2016-07-04 DIAGNOSIS — S0012XA Contusion of left eyelid and periocular area, initial encounter: Secondary | ICD-10-CM | POA: Insufficient documentation

## 2016-07-04 DIAGNOSIS — S0990XA Unspecified injury of head, initial encounter: Secondary | ICD-10-CM | POA: Diagnosis not present

## 2016-07-04 DIAGNOSIS — Y939 Activity, unspecified: Secondary | ICD-10-CM | POA: Diagnosis not present

## 2016-07-04 DIAGNOSIS — S0230XA Fracture of orbital floor, unspecified side, initial encounter for closed fracture: Secondary | ICD-10-CM

## 2016-07-04 DIAGNOSIS — S299XXA Unspecified injury of thorax, initial encounter: Secondary | ICD-10-CM | POA: Diagnosis present

## 2016-07-04 DIAGNOSIS — I509 Heart failure, unspecified: Secondary | ICD-10-CM | POA: Insufficient documentation

## 2016-07-04 DIAGNOSIS — W19XXXA Unspecified fall, initial encounter: Secondary | ICD-10-CM

## 2016-07-04 DIAGNOSIS — Y998 Other external cause status: Secondary | ICD-10-CM | POA: Diagnosis not present

## 2016-07-04 DIAGNOSIS — I110 Hypertensive heart disease with heart failure: Secondary | ICD-10-CM | POA: Diagnosis not present

## 2016-07-04 DIAGNOSIS — Z87891 Personal history of nicotine dependence: Secondary | ICD-10-CM | POA: Diagnosis not present

## 2016-07-04 DIAGNOSIS — S2231XA Fracture of one rib, right side, initial encounter for closed fracture: Secondary | ICD-10-CM

## 2016-07-04 DIAGNOSIS — J449 Chronic obstructive pulmonary disease, unspecified: Secondary | ICD-10-CM | POA: Diagnosis not present

## 2016-07-04 MED ORDER — HYDROCODONE-ACETAMINOPHEN 5-325 MG PO TABS
1.0000 | ORAL_TABLET | Freq: Once | ORAL | Status: AC
  Administered 2016-07-05: 1 via ORAL
  Filled 2016-07-04: qty 1

## 2016-07-04 NOTE — Discharge Instructions (Signed)
Please continue your hydrocodone at home as prescribed as needed for pain.  You do have a right third rib fracture. Please use your incentive spirometer every 1-2 hours while awake for the next 2 weeks to help prevent development of pneumonia.  [facility]. When you arrive at the front desk you may tell them that you're sent from [facility] emergency department for a CT of your head and face which have already been ordered. Dr. Venora Maples in the emergency department is aware that you are coming to the ED.

## 2016-07-04 NOTE — ED Triage Notes (Signed)
Pt tripped over a shoelace and fell on deck landing on his face. Pt with large black eye to L. Pt with c/o pain to front of body.

## 2016-07-04 NOTE — ED Provider Notes (Addendum)
TIME SEEN: 11:21  CHIEF COMPLAINT: Rib pain  HPI: HPI Comments:  Jeremy Parrish is a 75 y.o. male with history of atrial fibrillation, CHF status post pacemaker placement, aortic aneurysm, hypertension, hyperlipidemia, multiple myeloma who presents to the Emergency Department s/p fall at 12:30pm complaining of ecchymoses to his left face and left ribs. Reports that he tripped over his shoe lace and landed on the deck. He took hydrocodone PTA and states that is helped.  He denies LOC and vision problems.  No numbness or focal weakness. No abdominal pain, neck or back pain. Denies any preceding chest pain or shortness of breath that led to his fall. No recent fevers, cough, vomiting or diarrhea. States that he was at his normal state of health, feeling well before the fall.  ROS: See HPI Constitutional: no fever  Eyes: no drainage  ENT: no runny nose   Cardiovascular:   chest pain  Resp: no SOB  GI: no vomiting GU: no dysuria Integumentary: no rash  Allergy: no hives  Musculoskeletal: Right ribs: positive for arthralgias and myalgia no leg swelling  Neurological: no slurred speech ROS otherwise negative  PAST MEDICAL HISTORY/PAST SURGICAL HISTORY:  Past Medical History:  Diagnosis Date  . Anemia   . Aortic aneurysm of unspecified site without mention of rupture   . Arthritis   . Bladder neck contracture   . Cancer ([place])   . Cerebral atherosclerosis    Carotid Doppler, 02/16/2013 - Bilateral Proximal ICAs,demonstrate mild plaque w/o evidence of significant diameter reduction, dissection, or any other vascular abnormality  . CHF (congestive heart failure) ([place])   . Complication of anesthesia   . COPD (chronic obstructive pulmonary disease) ([place])   . Coronary artery disease   . Depression   . Esophageal reflux   . Heart disease   . Heart murmur   . Hx of bladder cancer 10/07/2012  . Hyperlipidemia   . Hypertension   . 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 Intestinovesical fistula   . Kidney stones    history  . Lung mass   . Multiple myeloma   . Myocardial infarction [facility])    '96  . Non Hodgkin's lymphoma ([place])   . Paroxysmal atrial fibrillation ([place]) 01/02/2016  . Peripheral arterial disease ([place])   . Personal history of other diseases of circulatory system   . PONV (postoperative nausea and vomiting)   . Prostate cancer ([place]) 2000  . Shingles   . Shortness of breath   . Sleep apnea    05-02-14 cpap , not yet used- suggested settings 5  . Stroke [facility]) 2013   Speech.    MEDICATIONS:  Prior to Admission medications   Medication Sig Start Date End Date Taking? Authorizing Provider  albuterol (PROVENTIL HFA;VENTOLIN HFA) 108 (90 Base) MCG/ACT inhaler Inhale 2 puffs into the lungs every 6 (six) hours as needed for wheezing or shortness of breath. 03/25/16   Mikey Kirschner, MD  albuterol (PROVENTIL) (2.5 MG/3ML) 0.083% nebulizer solution Use via neb q 4 hours prn wheezing 04/19/16   Nilda Simmer, NP  ALPRAZolam Duanne Moron) 0.5 MG tablet Take 1-2 tablets (0.5-1 mg total) by mouth at bedtime as needed for anxiety. 02/09/15   Baird Cancer, PA-C  amLODipine (NORVASC) 10 MG tablet TAKE 1 TABLET (10 MG TOTAL) BY MOUTH EVERY MORNING. 06/06/16   Mikey Kirschner, MD  budesonide (PULMICORT) 0.5 MG/2ML nebulizer solution Take 2 mLs (0.5 mg  total) by nebulization 2 (two) times daily. 04/19/16   Nilda Simmer, NP  chlorzoxazone (PARAFON) 500 MG tablet Take 1 tablet (500 mg total) by mouth 3 (three) times daily as needed for muscle spasms. 12/07/15   Mikey Kirschner, MD  citalopram (CELEXA) 40 MG tablet TAKE 1 TABLET (40 MG TOTAL) BY MOUTH DAILY. 05/16/16   Mikey Kirschner, MD  clopidogrel (PLAVIX) 75 MG tablet TAKE 1 TABLET BY MOUTH EVERY DAY 01/22/16   Mihai Croitoru, MD  ELIQUIS 5 MG TABS tablet TAKE 1 TABLET (5 MG TOTAL) BY MOUTH 2 (TWO) TIMES DAILY. 06/24/16   Mihai Croitoru, MD   HYDROcodone-acetaminophen (NORCO) 10-325 MG tablet Take 1 tablet by mouth every 4 (four) hours as needed. 05/30/16   Patrici Ranks, MD  HYDROcodone-acetaminophen (NORCO/VICODIN) 5-325 MG tablet Take 1 tablet by mouth 2 (two) times daily as needed. 05/30/16   Patrici Ranks, MD  ketoconazole (NIZORAL) 2 % cream Apply 1 application topically 2 (two) times daily. 05/03/16   Kathyrn Drown, MD  KLOR-CON M20 20 MEQ tablet Take 1 tablet (20 mEq total) by mouth 3 (three) times daily. Reported on 11/15/2015 06/27/16   Manon Hilding Kefalas, PA-C  lenalidomide (REVLIMID) 10 MG capsule TAKE 1 CAPSULE DAILY FOR 7 DAYS ON FOLLOWED BY 7 DAYS OFF, REPEAT CYCLE 06/21/16   Baird Cancer, PA-C  levocetirizine (XYZAL) 5 MG tablet Take 5 mg by mouth every evening.    Historical Provider, MD  meclizine (ANTIVERT) 25 MG tablet Take 1 tablet (25 mg total) by mouth 3 (three) times daily as needed for dizziness. 06/10/16   Mikey Kirschner, MD  nitroGLYCERIN (NITROSTAT) 0.4 MG SL tablet Place 1 tablet (0.4 mg total) under the tongue every 5 (five) minutes as needed for chest pain. 06/29/15   Mikey Kirschner, MD  omeprazole (PRILOSEC) 20 MG capsule TAKE 1 CAPSULE (20 MG TOTAL) BY MOUTH DAILY. 02/26/16   Mikey Kirschner, MD  simvastatin (ZOCOR) 20 MG tablet Take 1 tablet (20 mg total) by mouth at bedtime. 05/17/16   Lorretta Harp, MD    ALLERGIES:  Allergies  Allergen Reactions  . Morphine And Related Other (See Comments)    hallucinations  . Tape Rash    Paper tape is ok    SOCIAL HISTORY:  Social History  Substance Use Topics  . Smoking status: Former Smoker    Packs/day: 1.00    Years: 20.00    Types: Cigarettes    Quit date: 11/14/1994  . Smokeless tobacco: Never Used  . Alcohol use No     Comment: previously drank but none for at least 15 years.    FAMILY HISTORY: Family History  Problem Relation Age of Onset  . Cancer Father     bladder  . Heart disease Father     before age 38  . Hypertension  Mother   . Cancer Brother   . Heart disease Brother     before age 53  . Heart disease Sister     before age 38  . Hyperlipidemia Sister   . Hypertension Sister   . Heart attack Sister   . Colon cancer Neg Hx   . Colon polyps Neg Hx     EXAM: BP 127/75 (BP Location: Left Arm)   Pulse 73   Temp 98.1 F (36.7 C) (Oral)   Resp 16   Ht 5' 8"  (1.727 m)   Wt 185 lb (83.9 kg)   SpO2 95%  BMI 28.13 kg/m  CONSTITUTIONAL: Alert and oriented x 3 and responds appropriately to questions. Well-appearing; well-nourished; GCS 15, Elderly, slow to answer questions which is baseline for patient HEAD: Normocephalic; no sign of skull fracture EYES: Conjunctivae clear, PERRL, EOMI, left-sided periorbital ecchymosis and swelling, normal visual fields ENT: normal nose; no rhinorrhea; moist mucous membranes; pharynx without lesions noted; no dental injury; no septal hematoma NECK: Supple, no meningismus, no LAD; no midline spinal tenderness, step-off or deformity CARD: RRR; S1 and S2 appreciated; no murmurs, no clicks, no rubs, no gallops RESP: Normal chest excursion without splinting or tachypnea; breath sounds clear and equal bilaterally; no wheezes, no rhonchi, no rales; no hypoxia or respiratory distress CHEST:  chest wall stable, no crepitus or ecchymosis or deformity, nontender to palpation ABD/GI: Normal bowel sounds; non-distended; soft, non-tender, no rebound, no guarding PELVIS:  stable, nontender to palpation BACK:  The back appears normal and is non-tender to palpation, there is no CVA tenderness; no midline spinal tenderness, step-off or deformity EXT: Normal ROM in all joints; non-tender to palpation; no edema; normal capillary refill; no cyanosis, no bony tenderness or bony deformity of patient's extremities, no joint effusion, no ecchymosis or lacerations    SKIN: Normal color for age and race; warm, 2 cm skin tear to the left dorsal hand around the MCP NEURO: Moves all extremities  equally, sensation to light touch intact diffusely, cranial nerves II through XII intact PSYCH: The patient's mood and manner are appropriate. Grooming and personal hygiene are appropriate.  MEDICAL DECISION MAKING: Patient here with mechanical fall. X-ray does show a nondisplaced left third rib fracture with no pneumothorax. He has no hypoxia. Patient has plenty of Vicodin at home per his wife for pain control. States he took one prior to arrival and it helped significantly.  We'll also discharge him with incentive spirometer. Given he is on both Plavix and Eliquis I feel he needs imaging of his head as well as his face given his left-sided periorbital ecchymosis and swelling. At this time unfortunately we are unable to obtain a CT scan at [facility] emergency department therefore patient will be transferred to the [facility] emergency department for these images. He is aware that if they are negative, he will be discharged home. Family would like to take in by private vehicle. I feel this is appropriate given he is at his neurologic baseline, hemodynamically stable and not fall occurred almost 12 hours ago. I have discussed this with Dr. Venora Maples in the [facility] emergency department who agrees 6 of the patient in transfer. Patient and family are aware that they should go straight to the emergency department for these images.  I reviewed all nursing notes, vitals, pertinent old records, EKGs, labs, imaging (as available).   I personally performed the services described in this documentation, which was scribed in my presence. The recorded information has been reviewed and is accurate.    [place], DO 07/05/16 [place], DO 07/05/16 [phone]

## 2016-07-05 ENCOUNTER — Emergency Department (HOSPITAL_COMMUNITY): Payer: Medicare Other

## 2016-07-05 DIAGNOSIS — S0232XA Fracture of orbital floor, left side, initial encounter for closed fracture: Secondary | ICD-10-CM | POA: Diagnosis not present

## 2016-07-05 DIAGNOSIS — S2232XA Fracture of one rib, left side, initial encounter for closed fracture: Secondary | ICD-10-CM | POA: Diagnosis not present

## 2016-07-05 DIAGNOSIS — H353121 Nonexudative age-related macular degeneration, left eye, early dry stage: Secondary | ICD-10-CM | POA: Diagnosis not present

## 2016-07-05 DIAGNOSIS — H353111 Nonexudative age-related macular degeneration, right eye, early dry stage: Secondary | ICD-10-CM | POA: Diagnosis not present

## 2016-07-05 DIAGNOSIS — H02105 Unspecified ectropion of left lower eyelid: Secondary | ICD-10-CM | POA: Diagnosis not present

## 2016-07-05 DIAGNOSIS — D3131 Benign neoplasm of right choroid: Secondary | ICD-10-CM | POA: Diagnosis not present

## 2016-07-08 DIAGNOSIS — S022XXA Fracture of nasal bones, initial encounter for closed fracture: Secondary | ICD-10-CM | POA: Diagnosis not present

## 2016-07-10 ENCOUNTER — Encounter: Payer: Self-pay | Admitting: Cardiology

## 2016-07-10 ENCOUNTER — Other Ambulatory Visit: Payer: Self-pay

## 2016-07-16 LAB — CUP PACEART REMOTE DEVICE CHECK
Brady Statistic AP VS Percent: 89.55 %
Brady Statistic RA Percent Paced: 91.24 %
Brady Statistic RV Percent Paced: 1.8 %
Date Time Interrogation Session: 20170823153701
Implantable Lead Implant Date: 20120910
Implantable Lead Location: 753860
Implantable Lead Model: 5086
Implantable Lead Model: 5086
Lead Channel Impedance Value: 376 Ohm
Lead Channel Setting Pacing Amplitude: 2 V
Lead Channel Setting Pacing Amplitude: 2.5 V
Lead Channel Setting Sensing Sensitivity: 0.9 mV
MDC IDC LEAD IMPLANT DT: 20120910
MDC IDC LEAD LOCATION: 753859
MDC IDC MSMT BATTERY VOLTAGE: 2.97 V
MDC IDC MSMT LEADCHNL RA IMPEDANCE VALUE: 408 Ohm
MDC IDC MSMT LEADCHNL RA SENSING INTR AMPL: 1.996 mV
MDC IDC MSMT LEADCHNL RV SENSING INTR AMPL: 4.376 mV
MDC IDC SET LEADCHNL RV PACING PULSEWIDTH: 0.6 ms
MDC IDC STAT BRADY AP VP PERCENT: 1.69 %
MDC IDC STAT BRADY AS VP PERCENT: 0.11 %
MDC IDC STAT BRADY AS VS PERCENT: 8.65 %

## 2016-07-25 ENCOUNTER — Encounter (HOSPITAL_COMMUNITY): Payer: Medicare Other

## 2016-07-25 ENCOUNTER — Encounter (HOSPITAL_COMMUNITY): Payer: Self-pay | Admitting: Oncology

## 2016-07-25 ENCOUNTER — Encounter (HOSPITAL_COMMUNITY): Payer: Medicare Other | Attending: Hematology & Oncology

## 2016-07-25 ENCOUNTER — Encounter (HOSPITAL_BASED_OUTPATIENT_CLINIC_OR_DEPARTMENT_OTHER): Payer: Medicare Other | Admitting: Oncology

## 2016-07-25 ENCOUNTER — Other Ambulatory Visit (HOSPITAL_COMMUNITY): Payer: Self-pay | Admitting: Oncology

## 2016-07-25 VITALS — BP 126/75 | HR 61 | Temp 97.7°F | Resp 16 | Wt 181.6 lb

## 2016-07-25 DIAGNOSIS — Z8572 Personal history of non-Hodgkin lymphomas: Secondary | ICD-10-CM | POA: Insufficient documentation

## 2016-07-25 DIAGNOSIS — C9001 Multiple myeloma in remission: Secondary | ICD-10-CM

## 2016-07-25 DIAGNOSIS — D801 Nonfamilial hypogammaglobulinemia: Secondary | ICD-10-CM | POA: Diagnosis not present

## 2016-07-25 DIAGNOSIS — Z23 Encounter for immunization: Secondary | ICD-10-CM

## 2016-07-25 LAB — COMPREHENSIVE METABOLIC PANEL
ALK PHOS: 92 U/L (ref 38–126)
ALT: 15 U/L — ABNORMAL LOW (ref 17–63)
ANION GAP: 10 (ref 5–15)
AST: 24 U/L (ref 15–41)
Albumin: 3.7 g/dL (ref 3.5–5.0)
BUN: 9 mg/dL (ref 6–20)
CALCIUM: 8.9 mg/dL (ref 8.9–10.3)
CO2: 22 mmol/L (ref 22–32)
Chloride: 106 mmol/L (ref 101–111)
Creatinine, Ser: 1.04 mg/dL (ref 0.61–1.24)
Glucose, Bld: 131 mg/dL — ABNORMAL HIGH (ref 65–99)
Potassium: 3.8 mmol/L (ref 3.5–5.1)
SODIUM: 138 mmol/L (ref 135–145)
Total Bilirubin: 0.7 mg/dL (ref 0.3–1.2)
Total Protein: 6.6 g/dL (ref 6.5–8.1)

## 2016-07-25 LAB — CBC WITH DIFFERENTIAL/PLATELET
Basophils Absolute: 0.1 10*3/uL (ref 0.0–0.1)
Basophils Relative: 3 %
EOS ABS: 0.2 10*3/uL (ref 0.0–0.7)
EOS PCT: 5 %
HCT: 37.2 % — ABNORMAL LOW (ref 39.0–52.0)
HEMOGLOBIN: 12.1 g/dL — AB (ref 13.0–17.0)
LYMPHS ABS: 1.6 10*3/uL (ref 0.7–4.0)
Lymphocytes Relative: 42 %
MCH: 28.7 pg (ref 26.0–34.0)
MCHC: 32.5 g/dL (ref 30.0–36.0)
MCV: 88.4 fL (ref 78.0–100.0)
MONOS PCT: 8 %
Monocytes Absolute: 0.3 10*3/uL (ref 0.1–1.0)
Neutro Abs: 1.6 10*3/uL — ABNORMAL LOW (ref 1.7–7.7)
Neutrophils Relative %: 42 %
PLATELETS: 126 10*3/uL — AB (ref 150–400)
RBC: 4.21 MIL/uL — ABNORMAL LOW (ref 4.22–5.81)
RDW: 15.5 % (ref 11.5–15.5)
WBC: 3.7 10*3/uL — ABNORMAL LOW (ref 4.0–10.5)

## 2016-07-25 MED ORDER — HYDROCODONE-ACETAMINOPHEN 10-325 MG PO TABS: 1.0000 | ORAL_TABLET | ORAL | 0 refills | Status: DC | PRN

## 2016-07-25 MED ORDER — ACETAMINOPHEN 325 MG PO TABS
ORAL_TABLET | ORAL | Status: AC
  Filled 2016-07-25: qty 2

## 2016-07-25 MED ORDER — DEXTROSE 5 % IV SOLN
INTRAVENOUS | Status: DC
  Administered 2016-07-25: 10:00:00 via INTRAVENOUS

## 2016-07-25 MED ORDER — SODIUM CHLORIDE 0.9 % IJ SOLN
10.0000 mL | INTRAMUSCULAR | Status: DC | PRN
  Administered 2016-07-25: 10 mL
  Filled 2016-07-25: qty 10

## 2016-07-25 MED ORDER — HEPARIN SOD (PORK) LOCK FLUSH 100 UNIT/ML IV SOLN
500.0000 [IU] | Freq: Once | INTRAVENOUS | Status: AC | PRN
  Administered 2016-07-25: 500 [IU]

## 2016-07-25 MED ORDER — IMMUNE GLOBULIN (HUMAN) 10 GM/100ML IV SOLN
0.4000 g/kg | Freq: Once | INTRAVENOUS | Status: AC
  Administered 2016-07-25: 35 g via INTRAVENOUS
  Filled 2016-07-25: qty 50

## 2016-07-25 MED ORDER — ACETAMINOPHEN 325 MG PO TABS
650.0000 mg | ORAL_TABLET | Freq: Four times a day (QID) | ORAL | Status: DC | PRN
  Administered 2016-07-25: 650 mg via ORAL

## 2016-07-25 MED ORDER — INFLUENZA VAC SPLIT QUAD 0.5 ML IM SUSY
0.5000 mL | PREFILLED_SYRINGE | Freq: Once | INTRAMUSCULAR | Status: AC
  Administered 2016-07-25: 0.5 mL via INTRAMUSCULAR

## 2016-07-25 MED ORDER — SODIUM CHLORIDE 0.9 % IV SOLN: Freq: Once | INTRAVENOUS | Status: DC

## 2016-07-25 MED ORDER — INFLUENZA VAC SPLIT QUAD 0.5 ML IM SUSY
PREFILLED_SYRINGE | INTRAMUSCULAR | Status: AC
Start: 2016-07-25 — End: 2016-07-25
  Filled 2016-07-25: qty 0.5

## 2016-07-25 NOTE — Progress Notes (Signed)
Jeremy Parrish presents today for injection per MD orders. Flu vaccination administered IM in right Upper Arm. Administration without incident. Patient tolerated well. Patient tolerated infusion well.  VSS throughout.  Patient ambulatory and stable upon discharge from the clinic.

## 2016-07-25 NOTE — Progress Notes (Signed)
[name], MD [place] [address]  Multiple myeloma in remission [facility]) - Plan: CBC with Differential, Comprehensive metabolic panel, IgG, IgA, IgM, Kappa/lambda light chains, Immunofixation electrophoresis, Protein electrophoresis, serum, HYDROcodone-acetaminophen (NORCO) 10-325 MG tablet, DG Bone Survey Met, dextrose 5 % solution  Hx of lymphoma - Plan: CBC with Differential, Comprehensive metabolic panel, IgG, IgA, IgM, Kappa/lambda light chains, Immunofixation electrophoresis, Protein electrophoresis, serum, HYDROcodone-acetaminophen (NORCO) 10-325 MG tablet, DG Bone Survey Met, dextrose 5 % solution  Hypogammaglobulinemia (HCC) - Plan: CBC with Differential, Comprehensive metabolic panel, IgG, IgA, IgM, Kappa/lambda light chains, Immunofixation electrophoresis, Protein electrophoresis, serum, HYDROcodone-acetaminophen (NORCO) 10-325 MG tablet, DG Bone Survey Met, dextrose 5 % solution  CURRENT THERAPY: Revlimid 10 mg daily for 7 days on and 7 days off and monthly low-dose IVIG, on Eliquis for paroxysmal atrial fibrillation (managed by cardiology).  INTERVAL HISTORY: BRANCH PACITTI 75 y.o. 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 Marcell Anger. 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 In 2011 he was diagnosed with IgG lambda multiple myeloma and treated with 4 cycles of Velcade and dexamethasone followed by autologous peripheral blood stem cell transplant and currently remains on maintenance Revlimid and also monthly intravenous IgG. Zometa 4 mg intravenously monthly was discontinued with last treatment on 08/18/2014.   AND Hypogammaglobulinemia with history of frequent and recurrent infections requiring antibiotics and high dose IVIG until monthly low-dose IVIG was instituted with an excellent response with minimal antibiotic requirements.  Notes a fall after tripping over his shoelaces approximately 3 weeks ago. This resulted in an emergency room visit. Chart is reviewed along with imaging studies as a result of this fall.  He notes left sided thorax pain. This is secondary to fractured rib. He is utilizing pain medication for control of this discomfort. He requests a refill medication.  He otherwise is doing well.  Review of Systems  Constitutional: Negative.  Negative for chills, fever and weight loss.  HENT: Negative.   Eyes: Negative.   Respiratory: Negative.  Negative for cough and sputum production.   Cardiovascular: Positive for chest pain.  Gastrointestinal: Negative.   Genitourinary: Negative.  Negative for dysuria.  Musculoskeletal: Positive for falls.  Skin: Negative.   Neurological: Negative.  Negative for weakness.  Endo/Heme/Allergies: Negative.   Psychiatric/Behavioral: Negative.      Past Medical History:  Diagnosis Date  . Anemia   . Aortic aneurysm of unspecified site without mention of rupture   . Arthritis   . Bladder neck contracture   . Cancer ([place])   . Cerebral atherosclerosis    Carotid Doppler, 02/16/2013 - Bilateral Proximal ICAs,demonstrate mild plaque w/o evidence of significant diameter reduction, dissection, or any other vascular abnormality  . CHF (congestive heart failure) ([place])   . Complication of anesthesia   . COPD (chronic obstructive pulmonary disease) ([place])   . Coronary artery disease   . Depression   . Esophageal reflux   . Heart disease   . Heart murmur   . Hx of bladder cancer 10/07/2012  . Hyperlipidemia   . Hypertension   . Hypogammaglobulinemia ([place]) 09/28/2012   Secondary to Lymphoma  and Multiple Myeloma and their treatments  .  Intestinovesical fistula   . Kidney stones    history  . Lung mass   . Multiple myeloma   . Myocardial infarction [facility])    '96  . Non Hodgkin's lymphoma ([place])   . Paroxysmal atrial fibrillation ([place]) 01/02/2016  . Peripheral arterial disease ([place])   . Personal history of other diseases of circulatory system   . PONV (postoperative nausea and vomiting)   . Prostate cancer ([place]) 2000  . Shingles   . Shortness of breath   . Sleep apnea    05-02-14 cpap , not yet used- suggested settings 5  . Stroke [facility]) 2013   Speech.    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; Hx of bladder cancer; Hyperlipidemia with target LDL less than 100; Prediabetes; Expressive aphasia; Hemiplegia affecting right dominant side ([place]); Aneurysm of iliac artery ([place]); Shortness of breath; Rotator cuff tear arthropathy of right shoulder; Left rotator cuff tear arthropathy; Muscle weakness (generalized); Status post arthroscopy of shoulder; Pain in joint, shoulder region; Fever; Pneumonia; Pancytopenia ([place]); Chest pain with moderate risk for cardiac etiology; Inguinal hernia; Obstructive sleep apnea; Central sleep apnea; Hypokalemia; URI (upper respiratory infection); Weakness; Paroxysmal atrial fibrillation (HCC); and COPD (chronic obstructive pulmonary disease) ([place]) on his problem list.     is allergic to morphine and related and tape.  Current Outpatient Prescriptions on File Prior to Visit  Medication Sig Dispense Refill  . albuterol (PROVENTIL  HFA;VENTOLIN HFA) 108 (90 Base) MCG/ACT inhaler Inhale 2 puffs into the lungs every 6 (six) hours as needed for wheezing or shortness of breath. 1 Inhaler 3  . albuterol (PROVENTIL) (2.5 MG/3ML) 0.083% nebulizer solution Use via neb q 4 hours prn wheezing (Patient taking differently: Take 2.5 mg by nebulization every 4 (four) hours as needed for wheezing or shortness of breath. ) 75 vial 2  . ALPRAZolam (XANAX) 0.5 MG tablet Take 1-2 tablets (0.5-1 mg total) by mouth at bedtime as needed for anxiety. 90 tablet 1  . amLODipine (NORVASC) 10 MG tablet TAKE 1 TABLET (10 MG TOTAL) BY MOUTH EVERY MORNING. 90 tablet 0  . budesonide (PULMICORT) 0.5 MG/2ML nebulizer solution Take 2 mLs (0.5 mg total) by nebulization 2 (two) times daily. (Patient taking differently: Take 0.5 mg by nebulization daily as needed (for shortness of breath). ) 120 mL 2  . chlorzoxazone (PARAFON) 500 MG tablet Take 1 tablet (500 mg total) by mouth 3 (three) times daily as needed for muscle spasms. 60 tablet 2  . citalopram (CELEXA) 40 MG tablet TAKE 1 TABLET (40 MG TOTAL) BY MOUTH DAILY. 90 tablet 0  . clopidogrel (PLAVIX) 75 MG tablet TAKE 1 TABLET BY MOUTH EVERY DAY 90 tablet 3  . ELIQUIS 5 MG TABS tablet TAKE 1 TABLET (5 MG TOTAL) BY MOUTH 2 (TWO) TIMES DAILY. 60 tablet 5  . ketoconazole (NIZORAL) 2 % cream APPLY 1 APPLICATION TOPICALLY 2 (TWO) TIMES DAILY.  4  . KLOR-CON M20 20 MEQ tablet Take 1 tablet (20 mEq total) by mouth 3 (three) times daily. Reported on 11/15/2015 270 tablet 2  . levocetirizine (XYZAL) 5 MG tablet Take 5 mg by  mouth daily as needed for allergies.     Marland Kitchen LYRICA 75 MG capsule     . meclizine (ANTIVERT) 25 MG tablet Take 1 tablet (25 mg total) by mouth 3 (three) times daily as needed for dizziness. 30 tablet 0  . nitroGLYCERIN (NITROSTAT) 0.4 MG SL tablet Place 1 tablet (0.4 mg total) under the tongue every 5 (five) minutes as needed for chest pain. 25 tablet 0  . omeprazole (PRILOSEC) 20 MG capsule TAKE 1  CAPSULE (20 MG TOTAL) BY MOUTH DAILY. 90 capsule 1  . simvastatin (ZOCOR) 20 MG tablet Take 1 tablet (20 mg total) by mouth at bedtime. 90 tablet 1   Current Facility-Administered Medications on File Prior to Visit  Medication Dose Route Frequency Provider Last Rate Last Dose  . acetaminophen (TYLENOL) tablet 650 mg  650 mg Oral Q6H PRN Baird Cancer, PA-C   650 mg at 12/09/14 0855  . sodium chloride 0.9 % injection 10 mL  10 mL Intracatheter PRN Baird Cancer, PA-C   10 mL at 12/09/14 4580    Past Surgical History:  Procedure Laterality Date  . BLADDER SURGERY    . BONE MARROW TRANSPLANT  2011  . COLON SURGERY     colon resection  . COLONOSCOPY N/A 01/01/2013   Procedure: COLONOSCOPY;  Surgeon: Rogene Houston, MD;  Location: [facility];  Service: Endoscopy;  Laterality: N/A;  825-moved to [place] notified pt  . CORONARY ANGIOPLASTY  06/24/2000   PCI and stenting in mid & proximal RCA  . heart stents x 5  1999  . INGUINAL HERNIA REPAIR Right 05/04/2014   Procedure: OPEN RIGHT INGUINAL HERNIA REPAIR with mesh;  Surgeon: Edward Jolly, MD;  Location: [facility] ORS;  Service: General;  Laterality: Right;  . INSERT / REPLACE / REMOVE PACEMAKER    . left ear skin cancer removed    . NM MYOCAR PERF WALL MOTION  11/27/2007   inferior scar  . PACEMAKER INSERTION  07/22/2011   Medtronic  . PORTACATH PLACEMENT  07/26/2009   right chest  . PROSTATE SURGERY    . Rotator    . ROTATOR CUFF REPAIR Right   . SHOULDER ARTHROSCOPY WITH SUBACROMIAL DECOMPRESSION Right 07/21/2013   Procedure: RIGHT SHOULDER ARTHROSCOPY WITH SUBACROMIAL DECOMPRESSION AND DEBRIDEMENT & Injection of Left Shoulder;  Surgeon: Alta Corning, MD;  Location: [place];  Service: Orthopedics;  Laterality: Right;  . TEE WITHOUT CARDIOVERSION  10/13/2012   Procedure: TRANSESOPHAGEAL ECHOCARDIOGRAM (TEE);  Surgeon: Sanda Klein, MD;  Location: [facility] ENDOSCOPY;  Service: Cardiovascular;  Laterality: N/A;  pat/kay/echo notified  . US  ECHOCARDIOGRAPHY  06/19/2011   RV mildly dilated,mild to mod. MR,mild AI,mild PI  . WRIST SURGERY     right    PHYSICAL EXAMINATION  ECOG PERFORMANCE STATUS: 1 - Symptomatic but completely ambulatory  There were no vitals filed for this visit.  BP 128/75 Pulse 64 R 18 T 98.1 O2 99%  GENERAL:alert, no distress, well nourished, well developed, comfortable, cooperative, smiling, unaccompanied SKIN: skin color, texture, turgor are normal, no rashes or significant lesions HEAD: Normocephalic, No masses, lesions, tenderness or abnormalities EYES: normal, PERRLA, EOMI, Conjunctiva are pink and non-injected.  Left inferior orbit/superior left maxilla ecchymosis EARS: External ears normal OROPHARYNX:lips, buccal mucosa, and tongue normal and mucous membranes are moist  NECK: supple, thyroid normal size, non-tender, without nodularity, trachea midline LYMPH:  no palpable lymphadenopathy BREAST:not examined LUNGS: clear to auscultation and percussion without wheezes, rales, rhonchi. HEART: regular  rate & rhythm, no murmurs, no gallops, S1 normal and S2 normal ABDOMEN:abdomen soft, non-tender and normal bowel sounds BACK: Back symmetric, no curvature., No CVA tenderness EXTREMITIES:less then 2 second capillary refill, no joint deformities, effusion, or inflammation, no skin discoloration, no cyanosis  NEURO: alert & oriented x 3 with expressive aphasia (Broca's aphasia), no focal motor/sensory deficits, gait normal   LABORATORY DATA: CBC    Component Value Date/Time   WBC 3.7 (L) 07/25/2016 0845   RBC 4.21 (L) 07/25/2016 0845   HGB 12.1 (L) 07/25/2016 0845   HCT 37.2 (L) 07/25/2016 0845   PLT 126 (L) 07/25/2016 0845   MCV 88.4 07/25/2016 0845   MCH 28.7 07/25/2016 0845   MCHC 32.5 07/25/2016 0845   RDW 15.5 07/25/2016 0845   LYMPHSABS 1.6 07/25/2016 0845   MONOABS 0.3 07/25/2016 0845   EOSABS 0.2 07/25/2016 0845   BASOSABS 0.1 07/25/2016 0845      Chemistry      Component  Value Date/Time   NA 138 07/25/2016 0845   K 3.8 07/25/2016 0845   CL 106 07/25/2016 0845   CO2 22 07/25/2016 0845   BUN 9 07/25/2016 0845   CREATININE 1.04 07/25/2016 0845   CREATININE 0.80 11/07/2014 1034      Component Value Date/Time   CALCIUM 8.9 07/25/2016 0845   ALKPHOS 92 07/25/2016 0845   AST 24 07/25/2016 0845   ALT 15 (L) 07/25/2016 0845   BILITOT 0.7 07/25/2016 0845      Lab Results  Component Value Date   PROT 6.6 07/25/2016   ALBUMINELP 3.7 06/27/2016   A1GS 0.2 06/27/2016   A2GS 0.8 06/27/2016   BETS 0.8 06/27/2016   BETA2SER 5.5 01/06/2015   GAMS 1.0 06/27/2016   MSPIKE 0.3 (H) 06/27/2016   SPEI Comment 06/27/2016   SPECOM Comment 06/27/2016   IGGSERUM 821 06/27/2016   IGGSERUM 816 06/27/2016   IGA 227 06/27/2016   IGA 232 06/27/2016   IGMSERUM 30 06/27/2016   IGMSERUM 31 06/27/2016   IMMELINT (NOTE) 01/06/2015   KPAFRELGTCHN 28.7 (H) 06/27/2016   LAMBDASER 24.4 06/27/2016   KAPLAMBRATIO 1.18 06/27/2016    PENDING LABS:   RADIOGRAPHIC STUDIES:  Dg Ribs Bilateral W/chest  Result Date: 07/04/2016 CLINICAL DATA:  Fall hitting head with right and left rib pain. Initial encounter. EXAM: BILATERAL RIBS AND CHEST - 4+ VIEW COMPARISON:  Chest CT 04/24/2016 FINDINGS: Cardiomegaly. Porta catheter on the right. Stable mediastinal contours. Bibasilar atelectasis. No evidence of hemothorax or pneumothorax. Nondisplaced anterior left third rib fracture. Anterior lower left rib deformities appear chronic and correlate with chest CT comparison. No acute right rib fracture identified. IMPRESSION: 1. Nondisplaced left anterior third rib fracture. 2. Bibasilar atelectasis.  No hemothorax or pneumothorax. Electronically Signed   By: Monte Fantasia M.D.   On: 07/04/2016 22:51   Ct Head Wo Contrast  Result Date: 07/05/2016 CLINICAL DATA:  Fall with bruising around the left eye. EXAM: CT HEAD WITHOUT CONTRAST CT MAXILLOFACIAL WITHOUT CONTRAST TECHNIQUE: Multidetector CT  imaging of the head and maxillofacial structures were performed using the standard protocol without intravenous contrast. Multiplanar CT image reconstructions of the maxillofacial structures were also generated. COMPARISON:  11/03/2014 head CT FINDINGS: CT HEAD FINDINGS Brain: No evidence of acute infarction, hemorrhage, hydrocephalus, or mass lesion/mass effect. Remote moderate-sized posterior left frontal cortically based infarct. Vascular: No hyperdense vessel .  Atherosclerotic calcifications. Skull: Negative for fracture or focal lesion. Sinuses/Orbits: See below CT MAXILLOFACIAL FINDINGS Left orbital floor fracture traversing the infraorbital  canal with mild herniation of orbital fat. Left maxillary hemosinus. No herniation or rounding of the left inferior rectus. No evidence of globe injury or postseptal hematoma. The mandible is located and intact. Upper cervical facet arthropathy with C3-4 anterolisthesis that is fixed by facet ankylosis. IMPRESSION: 1. No evidence of acute intracranial injury. 2. Left orbital floor blowout fracture with small volume orbital fat herniation. 3. Remote left posterior frontal infarct. Electronically Signed   By: Monte Fantasia M.D.   On: 07/05/2016 03:42   Ct Maxillofacial Wo Contrast  Result Date: 07/05/2016 CLINICAL DATA:  Fall with bruising around the left eye. EXAM: CT HEAD WITHOUT CONTRAST CT MAXILLOFACIAL WITHOUT CONTRAST TECHNIQUE: Multidetector CT imaging of the head and maxillofacial structures were performed using the standard protocol without intravenous contrast. Multiplanar CT image reconstructions of the maxillofacial structures were also generated. COMPARISON:  11/03/2014 head CT FINDINGS: CT HEAD FINDINGS Brain: No evidence of acute infarction, hemorrhage, hydrocephalus, or mass lesion/mass effect. Remote moderate-sized posterior left frontal cortically based infarct. Vascular: No hyperdense vessel .  Atherosclerotic calcifications. Skull: Negative for  fracture or focal lesion. Sinuses/Orbits: See below CT MAXILLOFACIAL FINDINGS Left orbital floor fracture traversing the infraorbital canal with mild herniation of orbital fat. Left maxillary hemosinus. No herniation or rounding of the left inferior rectus. No evidence of globe injury or postseptal hematoma. The mandible is located and intact. Upper cervical facet arthropathy with C3-4 anterolisthesis that is fixed by facet ankylosis. IMPRESSION: 1. No evidence of acute intracranial injury. 2. Left orbital floor blowout fracture with small volume orbital fat herniation. 3. Remote left posterior frontal infarct. Electronically Signed   By: Monte Fantasia M.D.   On: 07/05/2016 03:42     PATHOLOGY:    ASSESSMENT AND PLAN:  Multiple myeloma in remission IgG lambda multiple myeloma status post stem cell transplant in October 2011. He has been on maintenance Revlimid 10 mg for 7 days on and 7 days off without any intolerances to date.  He is past the typical 2 years worth of maintenance therapy, but he was seen at [facility] in 2011 and advised to continue Revlimid.  Labs today: CBC diff, CMET, SPEP+IFE, light chain assay.  I personally reviewed and went over laboratory results with the patient.  The results are noted within this dictation.  He will continue maintenance Revlimid. He was on ASA daily, but this was discontinued by his cardiologist. He is now on Eliquis for paroxysmal atrial fibrillation. He is continued on Plavix as well.    Labs in 8 weeks: CBC diff, CMET, SPEP + IFE, and light chain assay.  He fell 3 weeks ago after tripping over his shoe laces.  He reported to the ED and imaging demonstrates a left orbital floor blowout fracture with small volume orbital fat herniation and nondisplaced left anterior third rib fracture.  I will refill his pain medication, Hydrocodone/APAP 10/325 mg.  Order is placed for skeletal survey in ~ 3 months (given recent rib fracture).  Return in 2 months for  follow-up.  Hx of lymphoma NED  Hypogammaglobulinemia Hypogammaglobulinemia, receiving IVIG every 4 weeks without any recent documented infections.  Tolerating well.   IVIG today and repeated every 4 weeks.   THERAPY PLAN:  Continue with current treatment.  Continue monthly IVIG.  Continue Revlimid 7 days on and 7 days off.  We will monitor WBC.    All questions were answered. The patient knows to call the clinic with any problems, questions or concerns. We can certainly see the  patient much sooner if necessary.  Patient and plan discussed with Dr. Ancil Linsey and she is in agreement with the aforementioned.   This note is electronically signed by: Doy Mince 07/25/2016 10:49 PM

## 2016-07-25 NOTE — Patient Instructions (Signed)
[place] at [facility] Discharge Instructions  RECOMMENDATIONS MADE BY THE CONSULTANT AND ANY TEST RESULTS WILL BE SENT TO YOUR REFERRING PHYSICIAN.  IVIG today.    Thank you for choosing [place] at [facility] to provide your oncology and hematology care.  To afford each patient quality time with our provider, please arrive at least 15 minutes before your scheduled appointment time.   Beginning January 23rd 2017 lab work for the [name] will be done in the  Main lab at [facility] on 1st floor. If you have a lab appointment with the [place] please come in thru the  Main Entrance and check in at the main information desk  You need to re-schedule your appointment should you arrive 10 or more minutes late.  We strive to give you quality time with our providers, and arriving late affects you and other patients whose appointments are after yours.  Also, if you no show three or more times for appointments you may be dismissed from the clinic at the providers discretion.     Again, thank you for choosing [facility].  Our hope is that these requests will decrease the amount of time that you wait before being seen by our physicians.       _____________________________________________________________  Should you have questions after your visit to [facility], please contact our office at [phone] between the hours of 8:30 a.m. and 4:30 p.m.  Voicemails left after 4:30 p.m. will not be returned until the following business day.  For prescription refill requests, have your pharmacy contact our office.         Resources For Cancer Patients and their Caregivers ? American Cancer Society: Can assist with transportation, wigs, general needs, runs Look Good Feel Better.        914-090-0565 ? Cancer Care: Provides financial assistance, online support groups, medication/co-pay assistance.  1-800-813-HOPE  445-235-2205) ? [place] Assists [place] cancer patients and their families through emotional , educational and financial support.  934-137-1562 ? [place] DSS Where to apply for food stamps, Medicaid and utility assistance. 5316034609 ? [facility]: Transportation to medical appointments. 8563533647 ? Social Security Administration: May apply for disability if have a Stage IV cancer. 8303908704 (850)266-7098 ? [employer], Disability and Transit Services: Assists with nutrition, care and transit needs. [place] Support Programs: @10RELATIVEDAYS @ > Cancer Support Group  2nd Tuesday of the month 1pm-2pm, Journey Room  > Creative Journey  3rd Tuesday of the month 1130am-1pm, Journey Room  > Look Good Feel Better  1st Wednesday of the month 10am-12 noon, Journey Room (Call [place] to register [phone])

## 2016-07-25 NOTE — Assessment & Plan Note (Signed)
NED

## 2016-07-25 NOTE — Assessment & Plan Note (Signed)
Hypogammaglobulinemia, receiving IVIG every 4 weeks without any recent documented infections.  Tolerating well.   IVIG today and repeated every 4 weeks.

## 2016-07-25 NOTE — Patient Instructions (Signed)
[place] at [facility] Discharge Instructions  RECOMMENDATIONS MADE BY THE CONSULTANT AND ANY TEST RESULTS WILL BE SENT TO YOUR REFERRING PHYSICIAN.  You were seen by Gershon Mussel today Labs in 8 weeks  Bone survey in 3 months Prescription for Hydrocodone given Return in 8 weeks for follow up  Thank you for choosing [place] at [facility] to provide your oncology and hematology care.  To afford each patient quality time with our provider, please arrive at least 15 minutes before your scheduled appointment time.   Beginning January 23rd 2017 lab work for the [name] will be done in the  Main lab at [facility] on 1st floor. If you have a lab appointment with the [facility] please come in thru the  Main Entrance and check in at the main information desk  You need to re-schedule your appointment should you arrive 10 or more minutes late.  We strive to give you quality time with our providers, and arriving late affects you and other patients whose appointments are after yours.  Also, if you no show three or more times for appointments you may be dismissed from the clinic at the providers discretion.     Again, thank you for choosing [facility].  Our hope is that these requests will decrease the amount of time that you wait before being seen by our physicians.       _____________________________________________________________  Should you have questions after your visit to [facility], please contact our office at [phone] between the hours of 8:30 a.m. and 4:30 p.m.  Voicemails left after 4:30 p.m. will not be returned until the following business day.  For prescription refill requests, have your pharmacy contact our office.         Resources For Cancer Patients and their Caregivers ? American Cancer Society: Can assist with transportation, wigs, general needs, runs Look Good Feel Better.         812-559-2277 ? Cancer Care: Provides financial assistance, online support groups, medication/co-pay assistance.  1-800-813-HOPE (319) 667-8034) ? [place] Assists [place] cancer patients and their families through emotional , educational and financial support.  (317) 367-4319 ? [place] DSS Where to apply for food stamps, Medicaid and utility assistance. 308-622-3508 ? RCATS: Transportation to medical appointments. (725)859-2931 ? Social Security Administration: May apply for disability if have a Stage IV cancer. 770-401-1904 [phone] ? [employer], Disability and Transit Services: Assists with nutrition, care and transit needs. [place] Support Programs: @10RELATIVEDAYS @ > Cancer Support Group  2nd Tuesday of the month 1pm-2pm, Journey Room  > Creative Journey  3rd Tuesday of the month 1130am-1pm, Journey Room  > Look Good Feel Better  1st Wednesday of the month 10am-12 noon, Journey Room (Call [place] to register [phone])

## 2016-07-25 NOTE — Assessment & Plan Note (Addendum)
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 He has been on maintenance Revlimid 10 mg for 7 days on and 7 days off without any intolerances to date.  He is past the typical 2 years worth of maintenance therapy, but he was seen at [facility] in 2011 and advised to continue Revlimid.  Labs today: CBC diff, CMET, SPEP+IFE, light chain assay.  I personally reviewed and went over laboratory results with the patient.  The results are noted within this dictation.  He will continue maintenance Revlimid. He was on ASA daily, but this was discontinued by his cardiologist. He is now on Eliquis for paroxysmal atrial fibrillation. He is continued on Plavix as well.    Labs in 8 weeks: CBC diff, CMET, SPEP + IFE, and light chain assay.  He fell 3 weeks ago after tripping over his shoe laces.  He reported to the ED and imaging demonstrates a left orbital floor blowout fracture with small volume orbital fat herniation and nondisplaced left anterior third rib fracture.  I will refill his pain medication, Hydrocodone/APAP 10/325 mg.  Order is placed for skeletal survey in ~ 3 months (given recent rib fracture).  Return in 2 months for follow-up.

## 2016-07-26 LAB — PROTEIN ELECTROPHORESIS, SERUM
A/G RATIO SPE: 1.2 (ref 0.7–1.7)
Albumin ELP: 3.3 g/dL (ref 2.9–4.4)
Alpha-1-Globulin: 0.3 g/dL (ref 0.0–0.4)
Alpha-2-Globulin: 0.7 g/dL (ref 0.4–1.0)
BETA GLOBULIN: 0.9 g/dL (ref 0.7–1.3)
GLOBULIN, TOTAL: 2.8 g/dL (ref 2.2–3.9)
Gamma Globulin: 0.9 g/dL (ref 0.4–1.8)
TOTAL PROTEIN ELP: 6.1 g/dL (ref 6.0–8.5)

## 2016-07-26 LAB — KAPPA/LAMBDA LIGHT CHAINS
KAPPA, LAMDA LIGHT CHAIN RATIO: 1.19 (ref 0.26–1.65)
Kappa free light chain: 34.1 mg/L — ABNORMAL HIGH (ref 3.3–19.4)
LAMDA FREE LIGHT CHAINS: 28.7 mg/L — AB (ref 5.7–26.3)

## 2016-07-26 LAB — IGG, IGA, IGM
IgA: 294 mg/dL (ref 61–437)
IgG (Immunoglobin G), Serum: 818 mg/dL (ref 700–1600)
IgM, Serum: 25 mg/dL (ref 15–143)

## 2016-07-29 LAB — IMMUNOFIXATION ELECTROPHORESIS
IgA: 278 mg/dL (ref 61–437)
IgG (Immunoglobin G), Serum: 842 mg/dL (ref 700–1600)
IgM, Serum: 25 mg/dL (ref 15–143)
TOTAL PROTEIN ELP: 6.2 g/dL (ref 6.0–8.5)

## 2016-07-31 ENCOUNTER — Other Ambulatory Visit (HOSPITAL_COMMUNITY): Payer: Medicare Other

## 2016-07-31 ENCOUNTER — Other Ambulatory Visit (HOSPITAL_COMMUNITY): Payer: Self-pay | Admitting: Pharmacist

## 2016-07-31 ENCOUNTER — Ambulatory Visit (HOSPITAL_COMMUNITY): Payer: Medicare Other | Admitting: Oncology

## 2016-08-11 ENCOUNTER — Other Ambulatory Visit: Payer: Self-pay | Admitting: Family Medicine

## 2016-08-16 DIAGNOSIS — C8300 Small cell B-cell lymphoma, unspecified site: Secondary | ICD-10-CM | POA: Diagnosis not present

## 2016-08-16 DIAGNOSIS — Z87891 Personal history of nicotine dependence: Secondary | ICD-10-CM | POA: Diagnosis not present

## 2016-08-16 DIAGNOSIS — Z9481 Bone marrow transplant status: Secondary | ICD-10-CM | POA: Diagnosis not present

## 2016-08-16 DIAGNOSIS — D801 Nonfamilial hypogammaglobulinemia: Secondary | ICD-10-CM | POA: Diagnosis not present

## 2016-08-16 DIAGNOSIS — C9000 Multiple myeloma not having achieved remission: Secondary | ICD-10-CM | POA: Diagnosis not present

## 2016-08-16 DIAGNOSIS — Z7982 Long term (current) use of aspirin: Secondary | ICD-10-CM | POA: Diagnosis not present

## 2016-08-16 DIAGNOSIS — C8590 Non-Hodgkin lymphoma, unspecified, unspecified site: Secondary | ICD-10-CM | POA: Diagnosis not present

## 2016-08-17 ENCOUNTER — Other Ambulatory Visit: Payer: Self-pay | Admitting: Family Medicine

## 2016-08-19 ENCOUNTER — Other Ambulatory Visit (HOSPITAL_COMMUNITY): Payer: Self-pay | Admitting: Emergency Medicine

## 2016-08-19 DIAGNOSIS — C9001 Multiple myeloma in remission: Secondary | ICD-10-CM

## 2016-08-19 MED ORDER — LENALIDOMIDE 10 MG PO CAPS: ORAL_CAPSULE | ORAL | 0 refills | Status: DC

## 2016-08-19 NOTE — Progress Notes (Signed)
revlimid refilled

## 2016-08-22 ENCOUNTER — Encounter (HOSPITAL_COMMUNITY): Payer: Self-pay

## 2016-08-22 ENCOUNTER — Encounter (HOSPITAL_COMMUNITY): Payer: Medicare Other | Attending: Oncology

## 2016-08-22 ENCOUNTER — Other Ambulatory Visit (HOSPITAL_COMMUNITY): Payer: Self-pay | Admitting: Emergency Medicine

## 2016-08-22 VITALS — BP 135/75 | HR 64 | Temp 98.2°F | Resp 18 | Wt 181.8 lb

## 2016-08-22 DIAGNOSIS — C9000 Multiple myeloma not having achieved remission: Secondary | ICD-10-CM | POA: Insufficient documentation

## 2016-08-22 DIAGNOSIS — D801 Nonfamilial hypogammaglobulinemia: Secondary | ICD-10-CM

## 2016-08-22 DIAGNOSIS — C9001 Multiple myeloma in remission: Secondary | ICD-10-CM | POA: Insufficient documentation

## 2016-08-22 MED ORDER — SODIUM CHLORIDE 0.9 % IV SOLN: Freq: Once | INTRAVENOUS | Status: DC

## 2016-08-22 MED ORDER — LENALIDOMIDE 10 MG PO CAPS: ORAL_CAPSULE | ORAL | 0 refills | Status: DC

## 2016-08-22 MED ORDER — HEPARIN SOD (PORK) LOCK FLUSH 100 UNIT/ML IV SOLN
500.0000 [IU] | Freq: Once | INTRAVENOUS | Status: AC
  Administered 2016-08-22: 500 [IU] via INTRAVENOUS

## 2016-08-22 MED ORDER — IMMUNE GLOBULIN (HUMAN) 10 GM/100ML IV SOLN
0.4000 g/kg | Freq: Once | INTRAVENOUS | Status: AC
  Administered 2016-08-22: 35 g via INTRAVENOUS
  Filled 2016-08-22: qty 350

## 2016-08-22 MED ORDER — DEXTROSE 5 % IV SOLN
INTRAVENOUS | Status: DC
  Administered 2016-08-22: 10:00:00 via INTRAVENOUS

## 2016-08-22 MED ORDER — ACETAMINOPHEN 325 MG PO TABS
650.0000 mg | ORAL_TABLET | Freq: Four times a day (QID) | ORAL | Status: DC | PRN
  Administered 2016-08-22: 650 mg via ORAL

## 2016-08-22 NOTE — Progress Notes (Signed)
Tolerated infusion w/o adverse reaction.  Alert, in no distress.  VSS.  Discharged ambulatory.  

## 2016-08-22 NOTE — Progress Notes (Unsigned)
revlimid refilled

## 2016-08-22 NOTE — Patient Instructions (Signed)
[place] at [facility] Discharge Instructions  RECOMMENDATIONS MADE BY THE CONSULTANT AND ANY TEST RESULTS WILL BE SENT TO YOUR REFERRING PHYSICIAN.  IVIG infusion today. Return as scheduled for lab work and infusions. Return as scheduled for office visit.   Thank you for choosing [place] at [facility] to provide your oncology and hematology care.  To afford each patient quality time with our provider, please arrive at least 15 minutes before your scheduled appointment time.   Beginning January 23rd 2017 lab work for the [name] will be done in the  Main lab at [facility] on 1st floor. If you have a lab appointment with the [facility] please come in thru the  Main Entrance and check in at the main information desk  You need to re-schedule your appointment should you arrive 10 or more minutes late.  We strive to give you quality time with our providers, and arriving late affects you and other patients whose appointments are after yours.  Also, if you no show three or more times for appointments you may be dismissed from the clinic at the providers discretion.     Again, thank you for choosing [facility].  Our hope is that these requests will decrease the amount of time that you wait before being seen by our physicians.       _____________________________________________________________  Should you have questions after your visit to [facility], please contact our office at [phone] between the hours of 8:30 a.m. and 4:30 p.m.  Voicemails left after 4:30 p.m. will not be returned until the following business day.  For prescription refill requests, have your pharmacy contact our office.         Resources For Cancer Patients and their Caregivers ? American Cancer Society: Can assist with transportation, wigs, general needs, runs Look Good Feel Better.        ([phone] ? Cancer  Care: Provides financial assistance, online support groups, medication/co-pay assistance.  1-800-813-HOPE 806 660 6277) ? [place] Assists [place] cancer patients and their families through emotional , educational and financial support.  910-836-1140 ? [place] DSS Where to apply for food stamps, Medicaid and utility assistance. 9010254488 ? RCATS: Transportation to medical appointments. 307-484-7510 ? Social Security Administration: May apply for disability if have a Stage IV cancer. 4132273789 626-347-4117 ? [employer], Disability and Transit Services: Assists with nutrition, care and transit needs. [place] Support Programs: @10RELATIVEDAYS @ > Cancer Support Group  2nd Tuesday of the month 1pm-2pm, Journey Room  > Creative Journey  3rd Tuesday of the month 1130am-1pm, Journey Room  > Look Good Feel Better  1st Wednesday of the month 10am-12 noon, Journey Room (Call [place] to register [phone])

## 2016-09-03 DIAGNOSIS — M1712 Unilateral primary osteoarthritis, left knee: Secondary | ICD-10-CM | POA: Diagnosis not present

## 2016-09-03 DIAGNOSIS — M1711 Unilateral primary osteoarthritis, right knee: Secondary | ICD-10-CM | POA: Diagnosis not present

## 2016-09-12 ENCOUNTER — Other Ambulatory Visit: Payer: Self-pay | Admitting: Pharmacist Clinician (PhC)/ Clinical Pharmacy Specialist

## 2016-09-12 MED ORDER — APIXABAN 5 MG PO TABS: 5.0000 mg | ORAL_TABLET | Freq: Two times a day (BID) | ORAL | Status: DC

## 2016-09-19 ENCOUNTER — Encounter (HOSPITAL_COMMUNITY): Payer: Medicare Other

## 2016-09-19 ENCOUNTER — Encounter (HOSPITAL_COMMUNITY): Payer: Self-pay | Admitting: Hematology & Oncology

## 2016-09-19 ENCOUNTER — Encounter (HOSPITAL_COMMUNITY): Payer: Medicare Other | Attending: Oncology

## 2016-09-19 ENCOUNTER — Other Ambulatory Visit (HOSPITAL_COMMUNITY): Payer: Self-pay | Admitting: Hematology & Oncology

## 2016-09-19 ENCOUNTER — Encounter (HOSPITAL_BASED_OUTPATIENT_CLINIC_OR_DEPARTMENT_OTHER): Payer: Medicare Other | Admitting: Hematology & Oncology

## 2016-09-19 VITALS — BP 150/77 | HR 77 | Temp 98.4°F | Resp 18 | Wt 187.6 lb

## 2016-09-19 VITALS — BP 134/71 | HR 59 | Temp 97.4°F | Resp 18

## 2016-09-19 DIAGNOSIS — D801 Nonfamilial hypogammaglobulinemia: Secondary | ICD-10-CM

## 2016-09-19 DIAGNOSIS — Z8572 Personal history of non-Hodgkin lymphomas: Secondary | ICD-10-CM | POA: Diagnosis not present

## 2016-09-19 DIAGNOSIS — C9001 Multiple myeloma in remission: Secondary | ICD-10-CM | POA: Insufficient documentation

## 2016-09-19 DIAGNOSIS — C9000 Multiple myeloma not having achieved remission: Secondary | ICD-10-CM | POA: Insufficient documentation

## 2016-09-19 LAB — CBC WITH DIFFERENTIAL/PLATELET
BASOS ABS: 0.1 10*3/uL (ref 0.0–0.1)
BASOS PCT: 1 %
EOS ABS: 0.2 10*3/uL (ref 0.0–0.7)
Eosinophils Relative: 6 %
HEMATOCRIT: 37.5 % — AB (ref 39.0–52.0)
HEMOGLOBIN: 12 g/dL — AB (ref 13.0–17.0)
Lymphocytes Relative: 35 %
Lymphs Abs: 1.3 10*3/uL (ref 0.7–4.0)
MCH: 28.8 pg (ref 26.0–34.0)
MCHC: 32 g/dL (ref 30.0–36.0)
MCV: 89.9 fL (ref 78.0–100.0)
MONOS PCT: 8 %
Monocytes Absolute: 0.3 10*3/uL (ref 0.1–1.0)
NEUTROS ABS: 1.9 10*3/uL (ref 1.7–7.7)
NEUTROS PCT: 50 %
Platelets: 105 10*3/uL — ABNORMAL LOW (ref 150–400)
RBC: 4.17 MIL/uL — AB (ref 4.22–5.81)
RDW: 16.1 % — ABNORMAL HIGH (ref 11.5–15.5)
WBC: 3.6 10*3/uL — AB (ref 4.0–10.5)

## 2016-09-19 LAB — COMPREHENSIVE METABOLIC PANEL
ALBUMIN: 3.7 g/dL (ref 3.5–5.0)
ALK PHOS: 65 U/L (ref 38–126)
ALT: 19 U/L (ref 17–63)
ANION GAP: 6 (ref 5–15)
AST: 23 U/L (ref 15–41)
BUN: 11 mg/dL (ref 6–20)
CALCIUM: 9 mg/dL (ref 8.9–10.3)
CO2: 26 mmol/L (ref 22–32)
Chloride: 105 mmol/L (ref 101–111)
Creatinine, Ser: 0.85 mg/dL (ref 0.61–1.24)
GFR calc Af Amer: >60 mL/min (ref >60)
GFR calc non Af Amer: >60 mL/min (ref >60)
GLUCOSE: 84 mg/dL (ref 65–99)
Potassium: 3.8 mmol/L (ref 3.5–5.1)
SODIUM: 137 mmol/L (ref 135–145)
Total Bilirubin: 0.5 mg/dL (ref 0.3–1.2)
Total Protein: 6.7 g/dL (ref 6.5–8.1)

## 2016-09-19 MED ORDER — HEPARIN SOD (PORK) LOCK FLUSH 100 UNIT/ML IV SOLN
500.0000 [IU] | Freq: Once | INTRAVENOUS | Status: AC | PRN
  Administered 2016-09-19: 500 [IU]

## 2016-09-19 MED ORDER — IMMUNE GLOBULIN (HUMAN) 10 GM/100ML IV SOLN
0.4000 g/kg | Freq: Once | INTRAVENOUS | Status: AC
  Administered 2016-09-19: 35 g via INTRAVENOUS
  Filled 2016-09-19: qty 100

## 2016-09-19 MED ORDER — ACETAMINOPHEN 325 MG PO TABS
ORAL_TABLET | ORAL | Status: AC
  Filled 2016-09-19: qty 2

## 2016-09-19 MED ORDER — DEXTROSE 5 % IV SOLN
INTRAVENOUS | Status: DC
  Administered 2016-09-19: 12:00:00 via INTRAVENOUS

## 2016-09-19 MED ORDER — SODIUM CHLORIDE 0.9 % IJ SOLN
10.0000 mL | INTRAMUSCULAR | Status: DC | PRN
  Administered 2016-09-19: 10 mL
  Filled 2016-09-19: qty 10

## 2016-09-19 MED ORDER — HEPARIN SOD (PORK) LOCK FLUSH 100 UNIT/ML IV SOLN
INTRAVENOUS | Status: AC
Start: 2016-09-19 — End: 2016-09-19
  Filled 2016-09-19: qty 5

## 2016-09-19 MED ORDER — SODIUM CHLORIDE 0.9 % IJ SOLN: 3.0000 mL | Freq: Once | INTRAMUSCULAR | Status: DC | PRN

## 2016-09-19 MED ORDER — ACETAMINOPHEN 325 MG PO TABS
650.0000 mg | ORAL_TABLET | Freq: Four times a day (QID) | ORAL | Status: DC | PRN
  Administered 2016-09-19: 650 mg via ORAL

## 2016-09-19 MED ORDER — SODIUM CHLORIDE 0.9 % IV SOLN: Freq: Once | INTRAVENOUS | Status: DC

## 2016-09-19 NOTE — Progress Notes (Signed)
Patient received IVIG today per orders. Patient tolerated well without problems. Vitals stable and discharged from clinic amublatory.

## 2016-09-19 NOTE — Patient Instructions (Signed)
[name] at [name] Discharge Instructions  RECOMMENDATIONS MADE BY THE CONSULTANT AND ANY TEST RESULTS WILL BE SENT TO YOUR REFERRING PHYSICIAN.  IVIG given today per orders.  Follow up as scheduled.  Thank you for choosing [place] at [facility] to provide your oncology and hematology care.  To afford each patient quality time with our provider, please arrive at least 15 minutes before your scheduled appointment time.   Beginning January 23rd 2017 lab work for the [name] will be done in the  Main lab at [facility] on 1st floor. If you have a lab appointment with the [name] please come in thru the  Main Entrance and check in at the main information desk  You need to re-schedule your appointment should you arrive 10 or more minutes late.  We strive to give you quality time with our providers, and arriving late affects you and other patients whose appointments are after yours.  Also, if you no show three or more times for appointments you may be dismissed from the clinic at the providers discretion.     Again, thank you for choosing [name].  Our hope is that these requests will decrease the amount of time that you wait before being seen by our physicians.       _____________________________________________________________  Should you have questions after your visit to [name], please contact our office at [phone] between the hours of 8:30 a.m. and 4:30 p.m.  Voicemails left after 4:30 p.m. will not be returned until the following business day.  For prescription refill requests, have your pharmacy contact our office.         Resources For Cancer Patients and their Caregivers ? American Cancer Society: Can assist with transportation, wigs, general needs, runs Look Good Feel Better.        669-361-4891 ? Cancer Care: Provides financial assistance, online support groups,  medication/co-pay assistance.  1-800-813-HOPE 743-564-6062) ? [place] Assists [place] cancer patients and their families through emotional , educational and financial support.  (713) 102-9808 ? [place] DSS Where to apply for food stamps, Medicaid and utility assistance. (202)661-1977 ? RCATS: Transportation to medical appointments. 570-067-5199 ? Social Security Administration: May apply for disability if have a Stage IV cancer. ([phone] 356-0124 985-463-2243 ? [employer], Disability and Transit Services: Assists with nutrition, care and transit needs. [name] Support Programs: @10RELATIVEDAYS @ > Cancer Support Group  2nd Tuesday of the month 1pm-2pm, Journey Room  > Creative Journey  3rd Tuesday of the month 1130am-1pm, Journey Room  > Look Good Feel Better  1st Wednesday of the month 10am-12 noon, Journey Room (Call [place] to register [phone])

## 2016-09-19 NOTE — Patient Instructions (Signed)
[place] at [facility] Discharge Instructions  RECOMMENDATIONS MADE BY THE CONSULTANT AND ANY TEST RESULTS WILL BE SENT TO YOUR REFERRING PHYSICIAN.  You saw Dr.Penland today. Continue monthly IVIG. Follow up in 3 months with labs. See Amy at checkout for appointments.  Thank you for choosing [place] at [facility] to provide your oncology and hematology care.  To afford each patient quality time with our provider, please arrive at least 15 minutes before your scheduled appointment time.   Beginning January 23rd 2017 lab work for the [name] will be done in the  Main lab at [facility] on 1st floor. If you have a lab appointment with the [facility] please come in thru the  Main Entrance and check in at the main information desk  You need to re-schedule your appointment should you arrive 10 or more minutes late.  We strive to give you quality time with our providers, and arriving late affects you and other patients whose appointments are after yours.  Also, if you no show three or more times for appointments you may be dismissed from the clinic at the providers discretion.     Again, thank you for choosing [facility].  Our hope is that these requests will decrease the amount of time that you wait before being seen by our physicians.       _____________________________________________________________  Should you have questions after your visit to [facility], please contact our office at [phone] between the hours of 8:30 a.m. and 4:30 p.m.  Voicemails left after 4:30 p.m. will not be returned until the following business day.  For prescription refill requests, have your pharmacy contact our office.         Resources For Cancer Patients and their Caregivers ? American Cancer Society: Can assist with transportation, wigs, general needs, runs Look Good Feel Better.        [phone] ? Cancer  Care: Provides financial assistance, online support groups, medication/co-pay assistance.  1-800-813-HOPE 6143502978) ? [place] Assists [place] cancer patients and their families through emotional , educational and financial support.  (508) 044-6816 ? [place] DSS Where to apply for food stamps, Medicaid and utility assistance. [phone] ? RCATS: Transportation to medical appointments. 515-257-1404 ? Social Security Administration: May apply for disability if have a Stage IV cancer. [phone] 239-256-5758 ? [employer], Disability and Transit Services: Assists with nutrition, care and transit needs. [place] Support Programs: @10RELATIVEDAYS @ > Cancer Support Group  2nd Tuesday of the month 1pm-2pm, Journey Room  > Creative Journey  3rd Tuesday of the month 1130am-1pm, Journey Room  > Look Good Feel Better  1st Wednesday of the month 10am-12 noon, Journey Room (Call [place] to register [phone])

## 2016-09-19 NOTE — Progress Notes (Signed)
[name], MD [address] [place]  [address]  THERAPY: Revlimid 10 mg daily for 7 days on and 7 days off after undergoing autologous peripheral blood stem cell transplant for IgG lambda multiple myeloma in October of 2011 at [facility] under the care of Dr. Marcell Anger. 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 In 2011 he was diagnosed with IgG lambda multiple myeloma and treated with 4 cycles of Velcade and dexamethasone followed by autologous peripheral blood stem cell transplant and currently remains on maintenance Revlimid and also monthly intravenous IgG.  Zometa 4 mg intravenously monthly was discontinued with last treatment on 08/18/2014.   DIAGNOSIS: IgG lambda Myeloma   CURRENT THERAPY: IVIG  Revlimid 10 mg 7 on/7 off  INTERVAL HISTORY: Jeremy Parrish 75 y.o. 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  Patient is unaccompanied. He is here for IVIG today.  He is not able to have shingles vaccine, but has not had shingles. He had multiple questions about this today.  He reports normal appetite. No nausea. He continues on his Revlimid. Last seen at [facility] in October.  Patient states blood thinners are expensive and he is in the doughnut hole. He is inquiring about assistance. No fever or chills. No cough. No other major complaints or concerns.    Past Medical History:  Diagnosis Date  . Anemia   . Aortic aneurysm of unspecified site without mention of rupture   . Arthritis   . Bladder neck contracture   . Cancer ([place])   . Cerebral atherosclerosis    Carotid Doppler, 02/16/2013 - Bilateral  Proximal ICAs,demonstrate mild plaque w/o evidence of significant diameter reduction, dissection, or any other vascular abnormality  . CHF (congestive heart failure) ([place])   . Complication of anesthesia   . COPD (chronic obstructive pulmonary disease) ([place])   . Coronary artery disease   . Depression   . Esophageal reflux   . Heart disease   . Heart murmur   . Hx of bladder cancer 10/07/2012  . Hyperlipidemia   . Hypertension   . 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 Intestinovesical fistula   . Kidney stones    history  . Lung mass   . Multiple myeloma   . Myocardial infarction    '96  . Non Hodgkin's lymphoma ([place])   . Paroxysmal atrial fibrillation ([place]) 01/02/2016  . Peripheral arterial disease ([place])   . Personal history of other diseases of circulatory system   . PONV (postoperative nausea and vomiting)   . Prostate cancer ([place]) 2000  . Shingles   . Shortness of breath   . Sleep apnea    05-02-14 cpap , not yet used- suggested settings 5  . Stroke [facility]) 2013   Speech.    has Hx of lymphoma; Multiple myeloma in remission ([place]); Anxiety state; Essential hypertension; MYOCARDIAL INFARCTION; Coronary atherosclerosis; ASTHMA, UNSPECIFIED; NEPHROLITHIASIS; ELEVATED PROSTATE SPECIFIC ANTIGEN; Nonspecific (abnormal) findings on radiological and other examination of body structure; ROTATOR CUFF REPAIR, RIGHT, HX OF; Hypogammaglobulinemia ([place]); Lt CVA with expressive aphasia Nov 2013; H/O cardiac pacemaker, Medtronic REVO, MRI conditional device, placed 07/2011 for sympyomatic  bradycardia; Hx of bladder cancer; Hyperlipidemia with target LDL less than 100; Prediabetes; Expressive aphasia; Hemiplegia affecting right dominant side ([place]); Aneurysm of iliac artery ([place]); Shortness of breath; Rotator cuff tear arthropathy of right shoulder; Left rotator cuff tear arthropathy; Muscle weakness (generalized); Status post  arthroscopy of shoulder; Pain in joint, shoulder region; Fever; Pneumonia; Pancytopenia ([place]); Chest pain with moderate risk for cardiac etiology; Inguinal hernia; Obstructive sleep apnea; Central sleep apnea; Hypokalemia; URI (upper respiratory infection); Weakness; Paroxysmal atrial fibrillation (HCC); and COPD (chronic obstructive pulmonary disease) ([place]) on his problem list.     is allergic to morphine and related and tape.  Mr. Pullara had no medications administered during this visit.  Past Surgical History:  Procedure Laterality Date  . BLADDER SURGERY    . BONE MARROW TRANSPLANT  2011  . COLON SURGERY     colon resection  . COLONOSCOPY N/A 01/01/2013   Procedure: COLONOSCOPY;  Surgeon: Rogene Houston, MD;  Location: [facility];  Service: Endoscopy;  Laterality: N/A;  825-moved to [place] notified pt  . CORONARY ANGIOPLASTY  06/24/2000   PCI and stenting in mid & proximal RCA  . heart stents x 5  1999  . INGUINAL HERNIA REPAIR Right 05/04/2014   Procedure: OPEN RIGHT INGUINAL HERNIA REPAIR with mesh;  Surgeon: Edward Jolly, MD;  Location: [facility] ORS;  Service: General;  Laterality: Right;  . INSERT / REPLACE / REMOVE PACEMAKER    . left ear skin cancer removed    . NM MYOCAR PERF WALL MOTION  11/27/2007   inferior scar  . PACEMAKER INSERTION  07/22/2011   Medtronic  . PORTACATH PLACEMENT  07/26/2009   right chest  . PROSTATE SURGERY    . Rotator    . ROTATOR CUFF REPAIR Right   . SHOULDER ARTHROSCOPY WITH SUBACROMIAL DECOMPRESSION Right 07/21/2013   Procedure: RIGHT SHOULDER ARTHROSCOPY WITH SUBACROMIAL DECOMPRESSION AND DEBRIDEMENT & Injection of Left Shoulder;  Surgeon: Alta Corning, MD;  Location: [place];  Service: Orthopedics;  Laterality: Right;  . TEE WITHOUT CARDIOVERSION  10/13/2012   Procedure: TRANSESOPHAGEAL ECHOCARDIOGRAM (TEE);  Surgeon: Sanda Klein, MD;  Location: [facility] ENDOSCOPY;  Service: Cardiovascular;  Laterality: N/A;  pat/kay/echo notified  . US  ECHOCARDIOGRAPHY  06/19/2011   RV mildly dilated,mild to mod. MR,mild AI,mild PI  . WRIST SURGERY     right    Denies any headaches, dizziness, double vision, fevers, chills, night sweats, nausea, vomiting, diarrhea, constipation, chest pain, heart palpitations, shortness of breath, blood in stool, black tarry stool, urinary pain, urinary burning, urinary frequency, hematuria.  14 point review of systems was performed and is negative except as detailed under history of present illness and above  PHYSICAL EXAMINATION ECOG PERFORMANCE STATUS: 1 - Symptomatic but completely ambulatory  Vitals with BMI 09/19/2016  Height   Weight 187 lbs 10 oz  BMI   Systolic 163  Diastolic 77  Pulse 77  Respirations 18   GENERAL:alert, no distress, well nourished, well developed, comfortable, cooperative, smiling and aphasia SKIN: skin color, texture, turgor are normal, no rashes or significant lesions HEAD: Normocephalic, No masses, lesions, tenderness or abnormalities EYES: normal, PERRLA, EOMI, Conjunctiva are pink and non-injected EARS: External ears normal OROPHARYNX:mucous membranes are moist  NECK: supple, no adenopathy, trachea midline LYMPH:  not examined BREAST:not examined LUNGS: clear to auscultation  HEART: regular rate & rhythm ABDOMEN:abdomen soft and normal bowel sounds No rebound, no guarding. No palpable masses BACK: Back symmetric, no curvature. EXTREMITIES:less then 2  second capillary refill, no joint deformities, effusion, or inflammation, no skin discoloration, no clubbing, no cyanosis  NEURO: alert & oriented x 3 , no focal motor/sensory deficits, gait normal   LABORATORY DATA: I have reviewed the data as listed  Results for KAZDEN, LARGO (MRN [id number])   Ref. Range 09/19/2016 10:08  Sodium Latest Ref Range: 135 - 145 mmol/L 137  Potassium Latest Ref Range: 3.5 - 5.1 mmol/L 3.8  Chloride Latest Ref Range: 101 - 111 mmol/L 105  CO2 Latest Ref Range: 22 - 32 mmol/L 26    BUN Latest Ref Range: 6 - 20 mg/dL 11  Creatinine Latest Ref Range: 0.61 - 1.24 mg/dL 0.85  Calcium Latest Ref Range: 8.9 - 10.3 mg/dL 9.0  EGFR (Non-African Amer.) Latest Ref Range: >60 mL/min >60  EGFR (African American) Latest Ref Range: >60 mL/min >60  Glucose Latest Ref Range: 65 - 99 mg/dL 84  Anion gap Latest Ref Range: 5 - 15  6  Alkaline Phosphatase Latest Ref Range: 38 - 126 U/L 65  Albumin Latest Ref Range: 3.5 - 5.0 g/dL 3.7  AST Latest Ref Range: 15 - 41 U/L 23  ALT Latest Ref Range: 17 - 63 U/L 19  Total Protein Latest Ref Range: 6.5 - 8.1 g/dL 6.7  Total Bilirubin Latest Ref Range: 0.3 - 1.2 mg/dL 0.5  Total Protein ELP Latest Ref Range: 6.0 - 8.5 g/dL   IgG (Immunoglobin G), Serum Latest Ref Range: 700 - 1,600 mg/dL 731  IgA Latest Ref Range: 61 - 437 mg/dL 249  IgM, Serum Latest Ref Range: 15 - 143 mg/dL 26  Kappa free light chain Latest Ref Range: 3.3 - 19.4 mg/L 25.7 (H)  Lamda free light chains Latest Ref Range: 5.7 - 26.3 mg/L 21.8  Kappa, lamda light chain ratio Latest Ref Range: 0.26 - 1.65  1.18  WBC Latest Ref Range: 4.0 - 10.5 K/uL 3.6 (L)  RBC Latest Ref Range: 4.22 - 5.81 MIL/uL 4.17 (L)  Hemoglobin Latest Ref Range: 13.0 - 17.0 g/dL 12.0 (L)  HCT Latest Ref Range: 39.0 - 52.0 % 37.5 (L)  MCV Latest Ref Range: 78.0 - 100.0 fL 89.9  MCH Latest Ref Range: 26.0 - 34.0 pg 28.8  MCHC Latest Ref Range: 30.0 - 36.0 g/dL 32.0  RDW Latest Ref Range: 11.5 - 15.5 % 16.1 (H)  Platelets Latest Ref Range: 150 - 400 K/uL 105 (L)  Neutrophils Latest Units: % 50  Lymphocytes Latest Units: % 35  Monocytes Relative Latest Units: % 8  Eosinophil Latest Units: % 6  Basophil Latest Units: % 1  NEUT# Latest Ref Range: 1.7 - 7.7 K/uL 1.9  Lymphocyte # Latest Ref Range: 0.7 - 4.0 K/uL 1.3  Monocyte # Latest Ref Range: 0.1 - 1.0 K/uL 0.3  Eosinophils Absolute Latest Ref Range: 0.0 - 0.7 K/uL 0.2  Basophils Absolute Latest Ref Range: 0.0 - 0.1 K/uL 0.1   ASSESSMENT AND  PLAN:  IgG lambda myeloma on maintenance revlimid since 2011. 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 He tolerates his Revlimid without difficulty ; he is clearly beyond the typical 2 year maintenance, but last seen in 08/2016 at [facility] and advised to continue. He is on IVIG for hypogammaglobulinemia . He tolerates it very  well and has had no recent problems with infection. We will continue with monthly therapy.  He will return in one month with labs and ongoing follow-up. No evidence of recurrent lymphoma.    I will ask Angie if there is assistance for Eliquis.   All questions were answered. The patient knows to call the clinic with any problems, questions or concerns. We can certainly see the patient much sooner if necessary.  This document serves as a record of services personally performed by Ancil Linsey, MD. It was created on her behalf by Elmyra Ricks, a trained medical scribe. The creation of this record is based on the scribe's personal observations and the provider's statements to them. This document has been checked and approved by the attending provider.  I have reviewed the above documentation for accuracy and completeness, and I agree with the above. Molli Hazard, MD  09/19/2016 9:34 AM

## 2016-09-20 ENCOUNTER — Other Ambulatory Visit (HOSPITAL_COMMUNITY): Payer: Self-pay | Admitting: Emergency Medicine

## 2016-09-20 DIAGNOSIS — C9001 Multiple myeloma in remission: Secondary | ICD-10-CM

## 2016-09-20 LAB — IGG, IGA, IGM
IGA: 249 mg/dL (ref 61–437)
IGG (IMMUNOGLOBIN G), SERUM: 731 mg/dL (ref 700–1600)
IGM, SERUM: 26 mg/dL (ref 15–143)

## 2016-09-20 LAB — PROTEIN ELECTROPHORESIS, SERUM
A/G RATIO SPE: 1.2 (ref 0.7–1.7)
ALBUMIN ELP: 3.4 g/dL (ref 2.9–4.4)
ALPHA-2-GLOBULIN: 0.7 g/dL (ref 0.4–1.0)
Alpha-1-Globulin: 0.2 g/dL (ref 0.0–0.4)
BETA GLOBULIN: 1 g/dL (ref 0.7–1.3)
GAMMA GLOBULIN: 0.9 g/dL (ref 0.4–1.8)
Globulin, Total: 2.8 g/dL (ref 2.2–3.9)
Total Protein ELP: 6.2 g/dL (ref 6.0–8.5)

## 2016-09-20 LAB — KAPPA/LAMBDA LIGHT CHAINS
KAPPA, LAMDA LIGHT CHAIN RATIO: 1.18 (ref 0.26–1.65)
Kappa free light chain: 25.7 mg/L — ABNORMAL HIGH (ref 3.3–19.4)
LAMDA FREE LIGHT CHAINS: 21.8 mg/L (ref 5.7–26.3)

## 2016-09-20 MED ORDER — LENALIDOMIDE 10 MG PO CAPS: ORAL_CAPSULE | ORAL | 0 refills | Status: DC

## 2016-09-20 MED ORDER — ONDANSETRON HCL 4 MG PO TABS
ORAL_TABLET | ORAL | Status: AC
  Filled 2016-09-20: qty 1

## 2016-09-20 NOTE — Progress Notes (Signed)
revilimid refilled  

## 2016-09-21 ENCOUNTER — Other Ambulatory Visit: Payer: Self-pay | Admitting: Family Medicine

## 2016-09-23 ENCOUNTER — Telehealth: Payer: Self-pay | Admitting: Family Medicine

## 2016-09-23 DIAGNOSIS — Z125 Encounter for screening for malignant neoplasm of prostate: Secondary | ICD-10-CM

## 2016-09-23 LAB — IMMUNOFIXATION ELECTROPHORESIS
IGM, SERUM: 32 mg/dL (ref 15–143)
IgA: 246 mg/dL (ref 61–437)
IgG (Immunoglobin G), Serum: 763 mg/dL (ref 700–1600)
Total Protein ELP: 6.3 g/dL (ref 6.0–8.5)

## 2016-09-23 NOTE — Telephone Encounter (Signed)
psa if not done elsewhere everything else looks good

## 2016-09-23 NOTE — Telephone Encounter (Signed)
Spoke with patient and patient stated that he had PSA done 3 months ago with Dr.Byrd.

## 2016-09-23 NOTE — Telephone Encounter (Signed)
Pt is requesting lab orders to be sent over for an upcoming wellness visit. Pt has had labs done through the cancer center recently. Pt is needing any additional tests that the Dr may want sent over.

## 2016-09-23 NOTE — Telephone Encounter (Signed)
Left message return call (PSA ordered in Epic)

## 2016-10-02 ENCOUNTER — Ambulatory Visit (INDEPENDENT_AMBULATORY_CARE_PROVIDER_SITE_OTHER): Payer: Medicare Other | Admitting: *Deleted

## 2016-10-02 DIAGNOSIS — I480 Paroxysmal atrial fibrillation: Secondary | ICD-10-CM

## 2016-10-02 NOTE — Progress Notes (Signed)
Remote pacemaker transmission.   

## 2016-10-10 ENCOUNTER — Encounter: Payer: Self-pay | Admitting: Cardiology

## 2016-10-10 DIAGNOSIS — L814 Other melanin hyperpigmentation: Secondary | ICD-10-CM | POA: Diagnosis not present

## 2016-10-10 DIAGNOSIS — D225 Melanocytic nevi of trunk: Secondary | ICD-10-CM | POA: Diagnosis not present

## 2016-10-10 DIAGNOSIS — L821 Other seborrheic keratosis: Secondary | ICD-10-CM | POA: Diagnosis not present

## 2016-10-10 DIAGNOSIS — L570 Actinic keratosis: Secondary | ICD-10-CM | POA: Diagnosis not present

## 2016-10-10 DIAGNOSIS — D1801 Hemangioma of skin and subcutaneous tissue: Secondary | ICD-10-CM | POA: Diagnosis not present

## 2016-10-10 DIAGNOSIS — Z85828 Personal history of other malignant neoplasm of skin: Secondary | ICD-10-CM | POA: Diagnosis not present

## 2016-10-10 DIAGNOSIS — L400 Psoriasis vulgaris: Secondary | ICD-10-CM | POA: Diagnosis not present

## 2016-10-11 ENCOUNTER — Other Ambulatory Visit: Payer: Self-pay | Admitting: Family Medicine

## 2016-10-17 ENCOUNTER — Ambulatory Visit (HOSPITAL_COMMUNITY): Payer: Medicare Other

## 2016-10-17 ENCOUNTER — Other Ambulatory Visit (HOSPITAL_COMMUNITY): Payer: Self-pay | Admitting: Oncology

## 2016-10-17 DIAGNOSIS — C9001 Multiple myeloma in remission: Secondary | ICD-10-CM

## 2016-10-17 MED ORDER — LENALIDOMIDE 10 MG PO CAPS: ORAL_CAPSULE | ORAL | 0 refills | Status: DC

## 2016-10-18 ENCOUNTER — Other Ambulatory Visit (HOSPITAL_COMMUNITY): Payer: Self-pay | Admitting: Oncology

## 2016-10-18 ENCOUNTER — Encounter (HOSPITAL_COMMUNITY): Payer: Self-pay

## 2016-10-18 ENCOUNTER — Encounter (HOSPITAL_COMMUNITY): Payer: Medicare Other | Attending: Oncology

## 2016-10-18 VITALS — BP 145/76 | HR 61 | Temp 98.2°F | Resp 16 | Wt 189.8 lb

## 2016-10-18 DIAGNOSIS — D801 Nonfamilial hypogammaglobulinemia: Secondary | ICD-10-CM | POA: Diagnosis not present

## 2016-10-18 DIAGNOSIS — C9001 Multiple myeloma in remission: Secondary | ICD-10-CM | POA: Insufficient documentation

## 2016-10-18 DIAGNOSIS — C9000 Multiple myeloma not having achieved remission: Secondary | ICD-10-CM | POA: Insufficient documentation

## 2016-10-18 DIAGNOSIS — Z8572 Personal history of non-Hodgkin lymphomas: Secondary | ICD-10-CM

## 2016-10-18 MED ORDER — HEPARIN SOD (PORK) LOCK FLUSH 100 UNIT/ML IV SOLN
500.0000 [IU] | Freq: Once | INTRAVENOUS | Status: AC | PRN
  Administered 2016-10-18: 500 [IU]

## 2016-10-18 MED ORDER — SODIUM CHLORIDE 0.9 % IJ SOLN
10.0000 mL | INTRAMUSCULAR | Status: DC | PRN
  Administered 2016-10-18: 10 mL
  Filled 2016-10-18: qty 10

## 2016-10-18 MED ORDER — HYDROCODONE-ACETAMINOPHEN 10-325 MG PO TABS: 1.0000 | ORAL_TABLET | ORAL | 0 refills | Status: DC | PRN

## 2016-10-18 MED ORDER — SODIUM CHLORIDE 0.9 % IV SOLN: Freq: Once | INTRAVENOUS | Status: DC

## 2016-10-18 MED ORDER — DEXTROSE 5 % IV SOLN
INTRAVENOUS | Status: DC
  Administered 2016-10-18: 10:00:00 via INTRAVENOUS

## 2016-10-18 MED ORDER — IMMUNE GLOBULIN (HUMAN) 10 GM/100ML IV SOLN
0.4000 g/kg | Freq: Once | INTRAVENOUS | Status: AC
  Administered 2016-10-18: 35 g via INTRAVENOUS
  Filled 2016-10-18: qty 50

## 2016-10-18 MED ORDER — ACETAMINOPHEN 325 MG PO TABS
650.0000 mg | ORAL_TABLET | Freq: Four times a day (QID) | ORAL | Status: DC | PRN
  Administered 2016-10-18: 650 mg via ORAL
  Filled 2016-10-18: qty 2

## 2016-10-18 NOTE — Progress Notes (Signed)
IVIG given today per orders.Patient tolerated it well no problems. Vitals stable and discharged ambulatory from clinic.

## 2016-10-18 NOTE — Patient Instructions (Signed)
[place] at [facility] Discharge Instructions  RECOMMENDATIONS MADE BY THE CONSULTANT AND ANY TEST RESULTS WILL BE SENT TO YOUR REFERRING PHYSICIAN.  IVIG given today. Follow up as scheduled.  Thank you for choosing [place] at [facility] to provide your oncology and hematology care.  To afford each patient quality time with our provider, please arrive at least 15 minutes before your scheduled appointment time.   Beginning January 23rd 2017 lab work for the [name] will be done in the  Main lab at [facility] on 1st floor. If you have a lab appointment with the [facility] please come in thru the  Main Entrance and check in at the main information desk  You need to re-schedule your appointment should you arrive 10 or more minutes late.  We strive to give you quality time with our providers, and arriving late affects you and other patients whose appointments are after yours.  Also, if you no show three or more times for appointments you may be dismissed from the clinic at the providers discretion.     Again, thank you for choosing [facility].  Our hope is that these requests will decrease the amount of time that you wait before being seen by our physicians.       _____________________________________________________________  Should you have questions after your visit to [facility], please contact our office at [phone] between the hours of 8:30 a.m. and 4:30 p.m.  Voicemails left after 4:30 p.m. will not be returned until the following business day.  For prescription refill requests, have your pharmacy contact our office.         Resources For Cancer Patients and their Caregivers ? American Cancer Society: Can assist with transportation, wigs, general needs, runs Look Good Feel Better.        (385)662-6790 ? Cancer Care: Provides financial assistance, online support groups, medication/co-pay  assistance.  1-800-813-HOPE 973 334 5307) ? [facility] [employer] Assists [place] cancer patients and their families through emotional , educational and financial support.  267-289-3882 ? [place] DSS Where to apply for food stamps, Medicaid and utility assistance. (803) 358-6513 ? [facility]: Transportation to medical appointments. 952-760-9969 ? Social Security Administration: May apply for disability if have a Stage IV cancer. (332) 061-0898 (928) 614-9903 ? [employer], Disability and Transit Services: Assists with nutrition, care and transit needs. [place] Support Programs: @10RELATIVEDAYS @ > Cancer Support Group  2nd Tuesday of the month 1pm-2pm, Journey Room  > Creative Journey  3rd Tuesday of the month 1130am-1pm, Journey Room  > Look Good Feel Better  1st Wednesday of the month 10am-12 noon, Journey Room (Call [place] to register [phone])

## 2016-10-19 LAB — CUP PACEART REMOTE DEVICE CHECK
Brady Statistic AP VP Percent: 1.47 %
Brady Statistic AS VP Percent: 0.19 %
Brady Statistic AS VS Percent: 5.94 %
Brady Statistic RA Percent Paced: 93.87 %
Brady Statistic RV Percent Paced: 1.66 %
Date Time Interrogation Session: 20171121172608
Implantable Lead Implant Date: 20120910
Implantable Lead Location: 753860
Implantable Lead Model: 5086
Implantable Pulse Generator Implant Date: 20120910
Lead Channel Impedance Value: 416 Ohm
Lead Channel Sensing Intrinsic Amplitude: 5.386 mV
Lead Channel Setting Pacing Amplitude: 2 V
Lead Channel Setting Pacing Amplitude: 2.5 V
MDC IDC LEAD IMPLANT DT: 20120910
MDC IDC LEAD LOCATION: 753859
MDC IDC LEAD MODEL: 5086
MDC IDC MSMT BATTERY VOLTAGE: 2.97 V
MDC IDC MSMT LEADCHNL RA IMPEDANCE VALUE: 416 Ohm
MDC IDC MSMT LEADCHNL RA SENSING INTR AMPL: 1.996 mV
MDC IDC SET LEADCHNL RV PACING PULSEWIDTH: 0.6 ms
MDC IDC SET LEADCHNL RV SENSING SENSITIVITY: 0.9 mV
MDC IDC STAT BRADY AP VS PERCENT: 92.4 %

## 2016-10-30 ENCOUNTER — Other Ambulatory Visit: Payer: Self-pay | Admitting: Family Medicine

## 2016-10-31 ENCOUNTER — Encounter (HOSPITAL_COMMUNITY): Payer: Self-pay | Admitting: Hematology & Oncology

## 2016-10-31 ENCOUNTER — Encounter: Payer: Medicare Other | Admitting: Family Medicine

## 2016-11-05 ENCOUNTER — Encounter: Payer: Self-pay | Admitting: Family Medicine

## 2016-11-05 ENCOUNTER — Ambulatory Visit (INDEPENDENT_AMBULATORY_CARE_PROVIDER_SITE_OTHER): Payer: Medicare Other | Admitting: Family Medicine

## 2016-11-05 VITALS — BP 110/72 | Ht 67.0 in | Wt 190.2 lb

## 2016-11-05 DIAGNOSIS — Z79899 Other long term (current) drug therapy: Secondary | ICD-10-CM

## 2016-11-05 DIAGNOSIS — Z0000 Encounter for general adult medical examination without abnormal findings: Secondary | ICD-10-CM

## 2016-11-05 DIAGNOSIS — Z125 Encounter for screening for malignant neoplasm of prostate: Secondary | ICD-10-CM

## 2016-11-05 DIAGNOSIS — I2510 Atherosclerotic heart disease of native coronary artery without angina pectoris: Secondary | ICD-10-CM

## 2016-11-05 DIAGNOSIS — E785 Hyperlipidemia, unspecified: Secondary | ICD-10-CM

## 2016-11-05 DIAGNOSIS — I10 Essential (primary) hypertension: Secondary | ICD-10-CM

## 2016-11-05 MED ORDER — OMEPRAZOLE 20 MG PO CPDR: DELAYED_RELEASE_CAPSULE | ORAL | 1 refills | Status: DC

## 2016-11-05 MED ORDER — AMLODIPINE BESYLATE 10 MG PO TABS: ORAL_TABLET | ORAL | 1 refills | Status: DC

## 2016-11-05 MED ORDER — CITALOPRAM HYDROBROMIDE 40 MG PO TABS: ORAL_TABLET | ORAL | 1 refills | Status: DC

## 2016-11-05 NOTE — Progress Notes (Signed)
Subjective:    Patient ID: Jeremy Parrish, male    DOB: 1941-09-13, 75 y.o.   MRN: [id number]  HPI AWV- Annual Wellness Visit  The patient was seen for their annual wellness visit. The patient's past medical history, surgical history, and family history were reviewed. Pertinent vaccines were reviewed ( tetanus, pneumonia, shingles, flu) The patient's medication list was reviewed and updated.  The height and weight were entered. The patient's current BMI is:29.80  Cognitive screening was completed. Outcome of Mini - Cog: passed  Falls within the past 6 months: patient tripped over his shoes laces and fell back in August 2017  Current tobacco usage: non-smoker (All patients who use tobacco were given written and verbal information on quitting)  Recent listing of emergency department/hospitalizations over the past year were reviewed.  current specialist the patient sees on a regular basis:  Cancer doctor once monthly  Medicare annual wellness visit patient questionnaire was reviewed.  A written screening schedule for the patient for the next 5-10 years was given. Appropriate discussion of followup regarding next visit was discussed.  Patient thinks he may have a hernia in his groin area.   Patient continues to take lipid medication regularly. No obvious side effects from it. Generally does not miss a dose. Prior blood work results are reviewed with patient. Patient continues to work on fat intake in diet  Blood pressure medicine and blood pressure levels reviewed today with patient. Compliant with blood pressure medicine. States does not miss a dose. No obvious side effects. Blood pressure generally good when checked elsewhere. Watching salt intake.   Patient notes ongoing compliance with antidepressant medication. No obvious side effects. Reports does not miss a dose. Overall continues to help depression substantially. No thoughts of homicide or suicide. Would like to maintain  medication.  ivg treatments regular    Review of Systems  Constitutional: Negative for activity change, appetite change and fever.  HENT: Negative for congestion and rhinorrhea.   Eyes: Negative for discharge.  Respiratory: Negative for cough and wheezing.   Cardiovascular: Negative for chest pain.  Gastrointestinal: Negative for abdominal pain, blood in stool and vomiting.  Genitourinary: Negative for difficulty urinating and frequency.  Musculoskeletal: Negative for neck pain.  Skin: Negative for rash.  Allergic/Immunologic: Negative for environmental allergies and food allergies.  Neurological: Negative for weakness and headaches.  Psychiatric/Behavioral: Negative for agitation.  All other systems reviewed and are negative.      Objective:   Physical Exam  Constitutional: He appears well-developed and well-nourished.  HENT:  Head: Normocephalic and atraumatic.  Right Ear: External ear normal.  Left Ear: External ear normal.  Nose: Nose normal.  Mouth/Throat: Oropharynx is clear and moist.  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 Pupils are equal, round, and reactive to light.  Neck: Normal range of motion. Neck supple. No thyromegaly present.  Cardiovascular: Normal rate, regular rhythm and normal heart sounds.   No murmur heard. Pulmonary/Chest: Effort normal and breath sounds normal. No respiratory distress. He has no wheezes.  Abdominal: Soft. Bowel sounds are normal. He exhibits no distension and no mass. There is no tenderness.  Genitourinary: Penis normal.  Musculoskeletal: Normal range of motion. He exhibits no edema.  Lymphadenopathy:    He has no cervical adenopathy.  Neurological: He is alert. He exhibits normal muscle tone.  Skin: Skin is warm and dry. No erythema.  Psychiatric: He has a normal mood and affect. His behavior is normal.  Judgment normal.  Vitals reviewed.         Assessment & Plan:  Impression  1 well adult exam 2 hypertension good control discussed maintain same meds #3 depression good control discussed maintain same #4 hyperlipidemia status uncertain plan colonoscopy due in one and a half years. Diet exercise discussed. Up-to-date on immunizations. Chronic medicines refilled. Patient had expressed concern about potential right groin hernia, however none 8 evident on exam follow-up in 6 months appropriate blood work

## 2016-11-08 ENCOUNTER — Encounter: Payer: Self-pay | Admitting: Family Medicine

## 2016-11-08 ENCOUNTER — Ambulatory Visit (INDEPENDENT_AMBULATORY_CARE_PROVIDER_SITE_OTHER): Payer: Medicare Other | Admitting: Family Medicine

## 2016-11-08 VITALS — BP 122/78 | Temp 98.9°F | Ht 67.0 in | Wt 189.6 lb

## 2016-11-08 DIAGNOSIS — I2510 Atherosclerotic heart disease of native coronary artery without angina pectoris: Secondary | ICD-10-CM

## 2016-11-08 DIAGNOSIS — R05 Cough: Secondary | ICD-10-CM

## 2016-11-08 DIAGNOSIS — J181 Lobar pneumonia, unspecified organism: Secondary | ICD-10-CM

## 2016-11-08 DIAGNOSIS — J189 Pneumonia, unspecified organism: Secondary | ICD-10-CM

## 2016-11-08 DIAGNOSIS — J111 Influenza due to unidentified influenza virus with other respiratory manifestations: Secondary | ICD-10-CM | POA: Diagnosis not present

## 2016-11-08 DIAGNOSIS — R059 Cough, unspecified: Secondary | ICD-10-CM

## 2016-11-08 MED ORDER — CEFPROZIL 500 MG PO TABS: 500.0000 mg | ORAL_TABLET | Freq: Two times a day (BID) | ORAL | 0 refills | Status: DC

## 2016-11-08 MED ORDER — ALBUTEROL SULFATE (2.5 MG/3ML) 0.083% IN NEBU: 2.5000 mg | INHALATION_SOLUTION | Freq: Once | RESPIRATORY_TRACT | 1 refills | Status: DC

## 2016-11-08 MED ORDER — OSELTAMIVIR PHOSPHATE 75 MG PO CAPS: 75.0000 mg | ORAL_CAPSULE | Freq: Two times a day (BID) | ORAL | 0 refills | Status: DC

## 2016-11-08 MED ORDER — CEFTRIAXONE SODIUM 1 G IJ SOLR
500.0000 mg | Freq: Once | INTRAMUSCULAR | Status: AC
  Administered 2016-11-08: 500 mg via INTRAMUSCULAR

## 2016-11-08 MED ORDER — ALBUTEROL SULFATE (2.5 MG/3ML) 0.083% IN NEBU
2.5000 mg | INHALATION_SOLUTION | Freq: Once | RESPIRATORY_TRACT | Status: AC
  Administered 2016-11-08: 2.5 mg via RESPIRATORY_TRACT

## 2016-11-08 NOTE — Progress Notes (Signed)
   Subjective:    Patient ID: Jeremy Parrish, male    DOB: 1941-06-06, 75 y.o.   MRN: [id number]  Cough  This is a new problem. The current episode started yesterday. Associated symptoms include a fever, nasal congestion, rhinorrhea, a sore throat and wheezing. Pertinent negatives include no chest pain or ear pain.   Patient states it hit him all the sudden and feels like he has flu-was exposed to flu Patient relates body aches headaches not feeling good chest congestion wheezing denies high fevers today but had high fevers last night  Review of Systems  Constitutional: Positive for fever. Negative for activity change.  HENT: Positive for congestion, rhinorrhea and sore throat. Negative for ear pain.   Eyes: Negative for discharge.  Respiratory: Positive for cough and wheezing.   Cardiovascular: Negative for chest pain.       Objective:   Physical Exam  Constitutional: He appears well-developed.  HENT:  Head: Normocephalic.  Mouth/Throat: Oropharynx is clear and moist. No oropharyngeal exudate.  Neck: Normal range of motion.  Cardiovascular: Normal rate, regular rhythm and normal heart sounds.   No murmur heard. Pulmonary/Chest: Effort normal. He has wheezes.  Rales left base Wheezes cleared after treatment  Lymphadenopathy:    He has no cervical adenopathy.  Neurological: He exhibits normal muscle tone.  Skin: Skin is warm and dry.  Nursing note and vitals reviewed.         Assessment & Plan:  Viral syndrome Probable flu Possible secondary pneumonia Shot of antibiotics orally antibiotics recommended Albuterol treatment given with improvement Warning signs discussed follow-up if progressive troubles Tamiflu twice a day recommended.

## 2016-11-14 ENCOUNTER — Ambulatory Visit (HOSPITAL_COMMUNITY)
Admission: RE | Admit: 2016-11-14 | Discharge: 2016-11-14 | Disposition: A | Discharge status: Home / Self Care | Payer: Medicare Other | Source: Ambulatory Visit | Attending: Oncology | Admitting: Oncology

## 2016-11-14 ENCOUNTER — Encounter (HOSPITAL_BASED_OUTPATIENT_CLINIC_OR_DEPARTMENT_OTHER): Payer: Medicare Other | Admitting: Oncology

## 2016-11-14 ENCOUNTER — Encounter (HOSPITAL_COMMUNITY): Payer: Medicare Other | Attending: Oncology

## 2016-11-14 ENCOUNTER — Encounter (HOSPITAL_COMMUNITY): Payer: Medicare Other

## 2016-11-14 ENCOUNTER — Other Ambulatory Visit (HOSPITAL_COMMUNITY)
Admission: RE | Admit: 2016-11-14 | Discharge: 2016-11-14 | Disposition: A | Discharge status: Home / Self Care | Payer: Medicare Other | Source: Ambulatory Visit | Attending: Family Medicine | Admitting: Family Medicine

## 2016-11-14 VITALS — BP 149/72 | HR 67 | Temp 97.8°F | Resp 18 | Wt 186.0 lb

## 2016-11-14 VITALS — BP 135/65 | HR 67 | Temp 97.9°F | Resp 20

## 2016-11-14 DIAGNOSIS — Z125 Encounter for screening for malignant neoplasm of prostate: Secondary | ICD-10-CM | POA: Diagnosis not present

## 2016-11-14 DIAGNOSIS — C9001 Multiple myeloma in remission: Secondary | ICD-10-CM | POA: Diagnosis not present

## 2016-11-14 DIAGNOSIS — J984 Other disorders of lung: Secondary | ICD-10-CM | POA: Insufficient documentation

## 2016-11-14 DIAGNOSIS — R05 Cough: Secondary | ICD-10-CM

## 2016-11-14 DIAGNOSIS — D801 Nonfamilial hypogammaglobulinemia: Secondary | ICD-10-CM

## 2016-11-14 DIAGNOSIS — E785 Hyperlipidemia, unspecified: Secondary | ICD-10-CM | POA: Insufficient documentation

## 2016-11-14 DIAGNOSIS — Z8572 Personal history of non-Hodgkin lymphomas: Secondary | ICD-10-CM | POA: Insufficient documentation

## 2016-11-14 DIAGNOSIS — Z79899 Other long term (current) drug therapy: Secondary | ICD-10-CM | POA: Insufficient documentation

## 2016-11-14 DIAGNOSIS — C9000 Multiple myeloma not having achieved remission: Secondary | ICD-10-CM | POA: Insufficient documentation

## 2016-11-14 DIAGNOSIS — R059 Cough, unspecified: Secondary | ICD-10-CM

## 2016-11-14 LAB — CBC WITH DIFFERENTIAL/PLATELET
BASOS ABS: 0.1 10*3/uL (ref 0.0–0.1)
BASOS PCT: 2 %
Eosinophils Absolute: 0.1 10*3/uL (ref 0.0–0.7)
Eosinophils Relative: 2 %
HEMATOCRIT: 38.4 % — AB (ref 39.0–52.0)
HEMOGLOBIN: 12.5 g/dL — AB (ref 13.0–17.0)
Lymphocytes Relative: 35 %
Lymphs Abs: 1.6 10*3/uL (ref 0.7–4.0)
MCH: 29.5 pg (ref 26.0–34.0)
MCHC: 32.6 g/dL (ref 30.0–36.0)
MCV: 90.6 fL (ref 78.0–100.0)
MONO ABS: 0.6 10*3/uL (ref 0.1–1.0)
Monocytes Relative: 12 %
NEUTROS ABS: 2.3 10*3/uL (ref 1.7–7.7)
NEUTROS PCT: 49 %
Platelets: 137 10*3/uL — ABNORMAL LOW (ref 150–400)
RBC: 4.24 MIL/uL (ref 4.22–5.81)
RDW: 15.8 % — AB (ref 11.5–15.5)
WBC: 4.7 10*3/uL (ref 4.0–10.5)

## 2016-11-14 LAB — HEPATIC FUNCTION PANEL
ALBUMIN: 3.8 g/dL (ref 3.5–5.0)
ALT: 29 U/L (ref 17–63)
AST: 28 U/L (ref 15–41)
Alkaline Phosphatase: 72 U/L (ref 38–126)
Bilirubin, Direct: <0.1 mg/dL — ABNORMAL LOW (ref 0.1–0.5)
Total Bilirubin: 0.4 mg/dL (ref 0.3–1.2)
Total Protein: 7.3 g/dL (ref 6.5–8.1)

## 2016-11-14 LAB — COMPREHENSIVE METABOLIC PANEL
ALBUMIN: 3.6 g/dL (ref 3.5–5.0)
ALT: 28 U/L (ref 17–63)
AST: 26 U/L (ref 15–41)
Alkaline Phosphatase: 69 U/L (ref 38–126)
Anion gap: 4 — ABNORMAL LOW (ref 5–15)
BILIRUBIN TOTAL: 0.4 mg/dL (ref 0.3–1.2)
BUN: 11 mg/dL (ref 6–20)
CO2: 28 mmol/L (ref 22–32)
Calcium: 9.1 mg/dL (ref 8.9–10.3)
Chloride: 106 mmol/L (ref 101–111)
Creatinine, Ser: 0.99 mg/dL (ref 0.61–1.24)
GFR calc Af Amer: >60 mL/min (ref >60)
GFR calc non Af Amer: >60 mL/min (ref >60)
GLUCOSE: 106 mg/dL — AB (ref 65–99)
POTASSIUM: 4.1 mmol/L (ref 3.5–5.1)
Sodium: 138 mmol/L (ref 135–145)
TOTAL PROTEIN: 7.1 g/dL (ref 6.5–8.1)

## 2016-11-14 LAB — LIPID PANEL
CHOLESTEROL: 102 mg/dL (ref 0–200)
HDL: 27 mg/dL — ABNORMAL LOW (ref >40)
LDL CALC: 51 mg/dL (ref 0–99)
Total CHOL/HDL Ratio: 3.8 RATIO
Triglycerides: 118 mg/dL (ref <150)
VLDL: 24 mg/dL (ref 0–40)

## 2016-11-14 LAB — PSA: PSA: 0.01 ng/mL (ref 0.00–4.00)

## 2016-11-14 MED ORDER — IMMUNE GLOBULIN (HUMAN) 10 GM/100ML IV SOLN
0.4000 g/kg | Freq: Once | INTRAVENOUS | Status: AC
  Administered 2016-11-14: 35 g via INTRAVENOUS
  Filled 2016-11-14: qty 100

## 2016-11-14 MED ORDER — HEPARIN SOD (PORK) LOCK FLUSH 100 UNIT/ML IV SOLN
500.0000 [IU] | Freq: Once | INTRAVENOUS | Status: AC | PRN
  Administered 2016-11-14: 500 [IU]

## 2016-11-14 MED ORDER — DEXTROSE 5 % IV SOLN
INTRAVENOUS | Status: DC
  Administered 2016-11-14: 11:00:00 via INTRAVENOUS

## 2016-11-14 MED ORDER — SODIUM CHLORIDE 0.9 % IJ SOLN: 10.0000 mL | INTRAMUSCULAR | Status: DC | PRN

## 2016-11-14 MED ORDER — BENZONATATE 200 MG PO CAPS: 200.0000 mg | ORAL_CAPSULE | Freq: Three times a day (TID) | ORAL | 0 refills | Status: DC | PRN

## 2016-11-14 MED ORDER — SODIUM CHLORIDE 0.9 % IV SOLN: Freq: Once | INTRAVENOUS | Status: DC

## 2016-11-14 MED ORDER — HYDROCODONE-ACETAMINOPHEN 10-325 MG PO TABS: 1.0000 | ORAL_TABLET | ORAL | 0 refills | Status: DC | PRN

## 2016-11-14 MED ORDER — ACETAMINOPHEN 325 MG PO TABS
ORAL_TABLET | ORAL | Status: AC
  Filled 2016-11-14: qty 2

## 2016-11-14 MED ORDER — ACETAMINOPHEN 325 MG PO TABS
650.0000 mg | ORAL_TABLET | Freq: Four times a day (QID) | ORAL | Status: DC | PRN
  Administered 2016-11-14: 650 mg via ORAL

## 2016-11-14 NOTE — Assessment & Plan Note (Addendum)
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 He has been on maintenance Revlimid 10 mg for 7 days on and 7 days off without any intolerances to date.  He is past the typical 2 years worth of maintenance therapy, but he was seen at [facility] in 2011 and advised to continue Revlimid.  He is also followed annually at [facility] for survivorship, last being seen in 08/2016.    Labs today: CBC diff, CMET, SPEP+IFE, light chain assay.  I personally reviewed and went over laboratory results with the patient.  The results are noted within this dictation.  He will continue maintenance Revlimid. He was on ASA daily, but this was discontinued by his cardiologist. He is now on Eliquis for paroxysmal atrial fibrillation. He is continued on Plavix as well.    Labs in 8 weeks: CBC diff, CMET, SPEP + IFE, and light chain assay.  Order has been previously placed for skeletal survey.  This has not yet been done.  He is to have this completed today following IVIG treatment.  I have refilled his pain medication after reviewing the [facility] Controlled Substance Reporting System.  Return in 2 months for follow-up.  Addendum: Skeletal survey is completed today.  Results are negative for any osseous lesions.  However, there is concern for pulmonary nodule.  Since he is recovering from recent infection, order is placed for xray of chest with nipple markers in 1-2 weeks.

## 2016-11-14 NOTE — Patient Instructions (Addendum)
[place] at [facility] Discharge Instructions  RECOMMENDATIONS MADE BY THE CONSULTANT AND ANY TEST RESULTS WILL BE SENT TO YOUR REFERRING PHYSICIAN.  Prescriptions given for Hydrocodone and Tessalon   Labs today done  Labs in 8 weeks & see physician team in 8 weeks  IVIG today and monthly  Continue Revelimid 7 days on and 7 days off  Skeletal Survey today - get done before you leave     Thank you for choosing [place] at [facility] to provide your oncology and hematology care.  To afford each patient quality time with our provider, please arrive at least 15 minutes before your scheduled appointment time.    If you have a lab appointment with the [facility] please come in thru the  Main Entrance and check in at the main information desk  You need to re-schedule your appointment should you arrive 10 or more minutes late.  We strive to give you quality time with our providers, and arriving late affects you and other patients whose appointments are after yours.  Also, if you no show three or more times for appointments you may be dismissed from the clinic at the providers discretion.     Again, thank you for choosing [facility].  Our hope is that these requests will decrease the amount of time that you wait before being seen by our physicians.       _____________________________________________________________  Should you have questions after your visit to [facility], please contact our office at [phone] between the hours of 8:30 a.m. and 4:30 p.m.  Voicemails left after 4:30 p.m. will not be returned until the following business day.  For prescription refill requests, have your pharmacy contact our office.       Resources For Cancer Patients and their Caregivers ? American Cancer Society: Can assist with transportation, wigs, general needs, runs Look Good Feel Better.         450 636 8771 ? Cancer Care: Provides financial assistance, online support groups, medication/co-pay assistance.  1-800-813-HOPE (508)111-1647) ? [facility] Assists [place] cancer patients and their families through emotional , educational and financial support.  610-517-7297 ? [place] DSS Where to apply for food stamps, Medicaid and utility assistance. 214-636-1666 ? RCATS: Transportation to medical appointments. 601-278-1709 ? Social Security Administration: May apply for disability if have a Stage IV cancer. 825 845 5692 684-700-5290 ? [employer], Disability and Transit Services: Assists with nutrition, care and transit needs. [place] Support Programs: @10RELATIVEDAYS @ > Cancer Support Group  2nd Tuesday of the month 1pm-2pm, Journey Room  > Creative Journey  3rd Tuesday of the month 1130am-1pm, Journey Room  > Look Good Feel Better  1st Wednesday of the month 10am-12 noon, Journey Room (Call [place] to register [phone])

## 2016-11-14 NOTE — Progress Notes (Signed)
Patient tolerated infusion well.  VSS.  Patient ambulatory and stable upon discharge from the clinic.   

## 2016-11-14 NOTE — Assessment & Plan Note (Signed)
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  NED. 

## 2016-11-14 NOTE — Patient Instructions (Signed)
[facility] at [facility] Discharge Instructions  RECOMMENDATIONS MADE BY THE CONSULTANT AND ANY TEST RESULTS WILL BE SENT TO YOUR REFERRING PHYSICIAN.  IVIG today  Thank you for choosing [facility] at [facility] to provide your oncology and hematology care.  To afford each patient quality time with our provider, please arrive at least 15 minutes before your scheduled appointment time.    If you have a lab appointment with the Cancer Center please come in thru the  Main Entrance and check in at the main information desk  You need to re-schedule your appointment should you arrive 10 or more minutes late.  We strive to give you quality time with our providers, and arriving late affects you and other patients whose appointments are after yours.  Also, if you no show three or more times for appointments you may be dismissed from the clinic at the providers discretion.     Again, thank you for choosing [facility].  Our hope is that these requests will decrease the amount of time that you wait before being seen by our physicians.       _____________________________________________________________  Should you have questions after your visit to [facility], please contact our office at [phone] between the hours of 8:30 a.m. and 4:30 p.m.  Voicemails left after 4:30 p.m. will not be returned until the following business day.  For prescription refill requests, have your pharmacy contact our office.       Resources For Cancer Patients and their Caregivers ? American Cancer Society: Can assist with transportation, wigs, general needs, runs Look Good Feel Better.        1-888-227-6333 ? Cancer Care: Provides financial assistance, online support groups, medication/co-pay assistance.  1-800-813-HOPE (4673) ? [facility] Assists [place] cancer patients and their families through emotional ,  educational and financial support.  336-427-4357 ? [place] DSS Where to apply for food stamps, Medicaid and utility assistance. 336-342-1394 ? RCATS: Transportation to medical appointments. 336-347-2287 ? Social Security Administration: May apply for disability if have a Stage IV cancer. 336-342-7796 1-800-772-1213 ? [place] Aging, Disability and Transit Services: Assists with nutrition, care and transit needs. 336-349-2343  Cancer Center Support Programs: @10RELATIVEDAYS@ > Cancer Support Group  2nd Tuesday of the month 1pm-2pm, Journey Room  > Creative Journey  3rd Tuesday of the month 1130am-1pm, Journey Room  > Look Good Feel Better  1st Wednesday of the month 10am-12 noon, Journey Room (Call American Cancer Society to register 1-800-395-5775)    

## 2016-11-14 NOTE — Assessment & Plan Note (Addendum)
Hypogammaglobulinemia, receiving IVIG every 4 weeks without significant decreased in documented infections.  Tolerating well.   IVIG today and repeated every 4 weeks.  Supportive therapy plan reviewed.  Recently diagnosed with influenza and secondary pneumonia.  Managed and treated by Dr. Wolfgang Phoenix.  He continues with a cough and therefore, I will give Rx for Tessalon.

## 2016-11-14 NOTE — Progress Notes (Signed)
[name], MD [place] [address]  Multiple myeloma in remission [facility]) - Plan: CBC with Differential, Comprehensive metabolic panel, Lactate dehydrogenase, Kappa/lambda light chains, IgG, IgA, IgM, Immunofixation electrophoresis, Protein electrophoresis, serum, HYDROcodone-acetaminophen (NORCO) 10-325 MG tablet  Hx of lymphoma - Plan: CBC with Differential, Comprehensive metabolic panel, Lactate dehydrogenase  Hypogammaglobulinemia (HCC) - Plan: CBC with Differential, IgG, IgA, IgM  Cough - Plan: benzonatate (TESSALON) 200 MG capsule, DG Chest 1 View  CURRENT THERAPY: Revlimid 10 mg daily for 7 days on and 7 days off and monthly low-dose IVIG, on Eliquis for paroxysmal atrial fibrillation (managed by cardiology).  INTERVAL HISTORY: Jeremy Parrish 76 y.o. 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 Marcell Anger. Initially treated with Vd x 4 cycles followed by BMT.  S/P bisphosphonate therapy with last treatment on 08/18/2014.  Annual survivorship follow-up at [facility], last seen in 08/2016. 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  AND Hypogammaglobulinemia with history of frequent and recurrent infections requiring antibiotics and high dose IVIG until monthly low-dose IVIG was instituted with an excellent response with minimal antibiotic requirements.  He reports improvement in his recently diagnosed illness that was managed by Dr. Wolfgang Parrish, suspicious for influenza versus viral illness with possible superimposed pneumonia.  He notes diarrhea that has resolved.  He continues with a cough that is not productive.  It is keeping him up at  night.  He feels good.  He denies any new complaints.   He continues with Revlimid without intolerances to date.  IVIG has significantly improved his infection rate.  Review of Systems  Constitutional: Negative.  Negative for chills, fever and weight loss.  HENT: Negative.  Negative for sore throat.   Eyes: Negative.   Respiratory: Positive for cough. Negative for hemoptysis and sputum production.   Cardiovascular: Negative.  Negative for chest pain.  Gastrointestinal: Negative.  Negative for abdominal pain, constipation, diarrhea, nausea and vomiting.  Genitourinary: Negative.  Negative for dysuria.  Skin: Negative.  Negative for rash.  Neurological: Negative.  Negative for weakness.  Endo/Heme/Allergies: Negative.   Psychiatric/Behavioral: Negative.     Past Medical History:  Diagnosis Date  . Anemia   . Aortic aneurysm of unspecified site without mention of rupture   . Arthritis   . Bladder neck contracture   . Cancer ([facility])   . Cerebral atherosclerosis    Carotid Doppler, 02/16/2013 - Bilateral Proximal ICAs,demonstrate mild plaque w/o evidence of significant diameter reduction, dissection, or any other vascular abnormality  . CHF (congestive heart failure) ([place])   . Complication of anesthesia   . COPD (chronic obstructive pulmonary disease) ([place])   . Coronary artery disease   . Depression   . Esophageal reflux   . Heart disease   . Heart murmur   . Hx of bladder cancer 10/07/2012  . Hyperlipidemia   . Hypertension   . Hypogammaglobulinemia ([place]) 09/28/2012   Secondary to Lymphoma and Multiple Myeloma and their treatments  . Intestinovesical fistula   . Kidney stones    history  . Lung mass   . Multiple myeloma   . Myocardial infarction    '  96  . Non Hodgkin's lymphoma ([place])   . Paroxysmal atrial fibrillation ([place]) 01/02/2016  . Peripheral arterial disease ([place])   . Personal history of other diseases of circulatory system   . PONV (postoperative nausea and  vomiting)   . Prostate cancer ([place]) 2000  . Shingles   . Shortness of breath   . Sleep apnea    05-02-14 cpap , not yet used- suggested settings 5  . Stroke [facility]) 2013   Speech.    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; H/O cardiac pacemaker, Medtronic REVO, MRI conditional device, placed 07/2011 for sympyomatic bradycardia; Hx of bladder cancer; Hyperlipidemia with target LDL less than 100; Prediabetes; Expressive aphasia; Hemiplegia affecting right dominant side ([place]); Aneurysm of iliac artery ([place]); Shortness of breath; Rotator cuff tear arthropathy of right shoulder; Left rotator cuff tear arthropathy; Muscle weakness (generalized); Status post arthroscopy of shoulder; Pain in joint, shoulder region; Fever; Pneumonia; Pancytopenia ([place]); Chest pain with moderate risk for cardiac etiology; Inguinal hernia; Obstructive sleep apnea; Central sleep apnea; Hypokalemia; URI (upper respiratory infection); Weakness; Paroxysmal atrial fibrillation (HCC); and COPD (chronic obstructive pulmonary disease) ([place]) on his problem list.     is allergic to morphine and related and tape.  Current Outpatient Prescriptions on File Prior to Visit  Medication Sig Dispense Refill  . albuterol (PROVENTIL HFA;VENTOLIN HFA) 108 (90 Base) MCG/ACT inhaler Inhale 2 puffs into the lungs every 6 (six) hours as needed for wheezing or shortness of breath. 1 Inhaler 3  . albuterol (PROVENTIL) (2.5 MG/3ML) 0.083% nebulizer solution Use via neb q 4 hours prn wheezing (Patient taking differently: Take 2.5 mg by nebulization every 4 (four) hours as needed for wheezing  or shortness of breath. ) 75 vial 2  . ALPRAZolam (XANAX) 0.5 MG tablet Take 1-2 tablets (0.5-1 mg total) by mouth at bedtime as needed for anxiety. 90 tablet 1  . amLODipine (NORVASC) 10 MG tablet TAKE 1 TABLET (10 MG TOTAL) BY MOUTH EVERY MORNING. 90 tablet 1  . apixaban (ELIQUIS) 5 MG TABS tablet Take 1 tablet (5 mg total) by mouth 2 (two) times daily. 180 tablet 01  . budesonide (PULMICORT) 0.5 MG/2ML nebulizer solution Take 2 mLs (0.5 mg total) by nebulization 2 (two) times daily. (Patient taking differently: Take 0.5 mg by nebulization daily as needed (for shortness of breath). ) 120 mL 2  . cefPROZIL (CEFZIL) 500 MG tablet Take 1 tablet (500 mg total) by mouth 2 (two) times daily. 20 tablet 0  . chlorzoxazone (PARAFON) 500 MG tablet Take 1 tablet (500 mg total) by mouth 3 (three) times daily as needed for muscle spasms. 60 tablet 2  . citalopram (CELEXA) 40 MG tablet TAKE 1 TABLET (40 MG TOTAL) BY MOUTH DAILY. 90 tablet 1  . clopidogrel (PLAVIX) 75 MG tablet TAKE 1 TABLET BY MOUTH EVERY DAY 90 tablet 3  . furosemide (LASIX) 20 MG tablet USE SPARINGLY AS NEEDED FOR EDEMA NO MORE THAN ONE PER WEEK 21 tablet 0  . ketoconazole (NIZORAL) 2 % cream APPLY 1 APPLICATION TOPICALLY 2 (TWO) TIMES DAILY.  4  . KLOR-CON M20 20 MEQ tablet Take 1 tablet (20 mEq total) by mouth 3 (three) times daily. Reported on 11/15/2015 270 tablet 2  . lenalidomide (  REVLIMID) 10 MG capsule TAKE 1 CAPSULE BY MOUTH DAILY FOR 7 DAYS ON FOLLOWED BY 7 DAYS OFF, THEN REPEAT CYCLE. 14 capsule 0  . levocetirizine (XYZAL) 5 MG tablet Take 5 mg by mouth daily as needed for allergies.     Marland Kitchen LYRICA 75 MG capsule     . meclizine (ANTIVERT) 25 MG tablet Take 1 tablet (25 mg total) by mouth 3 (three) times daily as needed for dizziness. 30 tablet 0  . nitroGLYCERIN (NITROSTAT) 0.4 MG SL tablet Place 1 tablet (0.4 mg total) under the tongue every 5 (five) minutes as needed for chest pain. 25 tablet 0  . omeprazole (PRILOSEC) 20 MG capsule  TAKE 1 CAPSULE (20 MG TOTAL) BY MOUTH DAILY. 90 capsule 1  . oseltamivir (TAMIFLU) 75 MG capsule Take 1 capsule (75 mg total) by mouth 2 (two) times daily. 10 capsule 0  . simvastatin (ZOCOR) 20 MG tablet Take 1 tablet (20 mg total) by mouth at bedtime. 90 tablet 1   Current Facility-Administered Medications on File Prior to Visit  Medication Dose Route Frequency Provider Last Rate Last Dose  . 0.9 %  sodium chloride infusion   Intravenous Once Patrici Ranks, MD      . acetaminophen (TYLENOL) tablet 650 mg  650 mg Oral Q6H PRN Baird Cancer, PA-C   650 mg at 12/09/14 0855  . acetaminophen (TYLENOL) tablet 650 mg  650 mg Oral Q6H PRN Patrici Ranks, MD   650 mg at 11/14/16 1010  . dextrose 5 % solution   Intravenous Continuous Baird Cancer, PA-C   Stopped at 11/14/16 1334  . sodium chloride 0.9 % injection 10 mL  10 mL Intracatheter PRN Baird Cancer, PA-C   10 mL at 12/09/14 0856  . sodium chloride 0.9 % injection 10 mL  10 mL Intracatheter PRN Patrici Ranks, MD        Past Surgical History:  Procedure Laterality Date  . BLADDER SURGERY    . BONE MARROW TRANSPLANT  2011  . COLON SURGERY     colon resection  . COLONOSCOPY N/A 01/01/2013   Procedure: COLONOSCOPY;  Surgeon: Rogene [place], MD;  Location: [facility];  Service: Endoscopy;  Laterality: N/A;  825-moved to [place] notified pt  . CORONARY ANGIOPLASTY  06/24/2000   PCI and stenting in mid & proximal RCA  . heart stents x 5  1999  . INGUINAL HERNIA REPAIR Right 05/04/2014   Procedure: OPEN RIGHT INGUINAL HERNIA REPAIR with mesh;  Surgeon: Edward Jolly, MD;  Location: [facility] ORS;  Service: General;  Laterality: Right;  . INSERT / REPLACE / REMOVE PACEMAKER    . left ear skin cancer removed    . NM MYOCAR PERF WALL MOTION  11/27/2007   inferior scar  . PACEMAKER INSERTION  07/22/2011   Medtronic  . PORTACATH PLACEMENT  07/26/2009   right chest  . PROSTATE SURGERY    . Rotator    . ROTATOR CUFF REPAIR  Right   . SHOULDER ARTHROSCOPY WITH SUBACROMIAL DECOMPRESSION Right 07/21/2013   Procedure: RIGHT SHOULDER ARTHROSCOPY WITH SUBACROMIAL DECOMPRESSION AND DEBRIDEMENT & Injection of Left Shoulder;  Surgeon: Alta Corning, MD;  Location: [facility];  Service: Orthopedics;  Laterality: Right;  . TEE WITHOUT CARDIOVERSION  10/13/2012   Procedure: TRANSESOPHAGEAL ECHOCARDIOGRAM (TEE);  Surgeon: Sanda Klein, MD;  Location: [facility] ENDOSCOPY;  Service: Cardiovascular;  Laterality: N/A;  pat/kay/echo notified  . US ECHOCARDIOGRAPHY  06/19/2011   RV  mildly dilated,mild to mod. MR,mild AI,mild PI  . WRIST SURGERY     right    PHYSICAL EXAMINATION  ECOG PERFORMANCE STATUS: 1 - Symptomatic but completely ambulatory  Vitals:   11/14/16 1002  BP: (!) 149/72  Pulse: 67  Resp: 18  Temp: 97.8 F (36.6 C)     GENERAL:alert, no distress, well nourished, well developed, comfortable, cooperative, smiling, unaccompanied SKIN: skin color, texture, turgor are normal, no rashes or significant lesions HEAD: Normocephalic, No masses, lesions, tenderness or abnormalities EYES: normal, PERRLA, EOMI, Conjunctiva are pink and non-injected.  EARS: External ears normal OROPHARYNX:lips, buccal mucosa, and tongue normal and mucous membranes are moist  NECK: supple, thyroid normal size, non-tender, without nodularity, trachea midline LYMPH:  no palpable lymphadenopathy BREAST:not examined LUNGS: Bilateral inspiration and expiratory wheezing and rhonchi  HEART: regular rate & rhythm, no murmurs, no gallops, S1 normal and S2 normal ABDOMEN:abdomen soft, non-tender and normal bowel sounds BACK: Back symmetric, no curvature., No CVA tenderness EXTREMITIES:less then 2 second capillary refill, no joint deformities, effusion, or inflammation, no skin discoloration, no cyanosis  NEURO: alert & oriented x 3 with expressive aphasia (Broca's aphasia), no focal motor/sensory deficits, gait normal   LABORATORY DATA: CBC      Component Value Date/Time   WBC 4.7 11/14/2016 0939   RBC 4.24 11/14/2016 0939   HGB 12.5 (L) 11/14/2016 0939   HCT 38.4 (L) 11/14/2016 0939   PLT 137 (L) 11/14/2016 0939   MCV 90.6 11/14/2016 0939   MCH 29.5 11/14/2016 0939   MCHC 32.6 11/14/2016 0939   RDW 15.8 (H) 11/14/2016 0939   LYMPHSABS 1.6 11/14/2016 0939   MONOABS 0.6 11/14/2016 0939   EOSABS 0.1 11/14/2016 0939   BASOSABS 0.1 11/14/2016 0939      Chemistry      Component Value Date/Time   NA 138 11/14/2016 0939   K 4.1 11/14/2016 0939   CL 106 11/14/2016 0939   CO2 28 11/14/2016 0939   BUN 11 11/14/2016 0939   CREATININE 0.99 11/14/2016 0939   CREATININE 0.80 11/07/2014 1034      Component Value Date/Time   CALCIUM 9.1 11/14/2016 0939   ALKPHOS 72 11/14/2016 0945   AST 28 11/14/2016 0945   ALT 29 11/14/2016 0945   BILITOT 0.4 11/14/2016 0945      Lab Results  Component Value Date   PROT 7.3 11/14/2016   ALBUMINELP 3.4 09/19/2016   A1GS 0.2 09/19/2016   A2GS 0.7 09/19/2016   BETS 1.0 09/19/2016   BETA2SER 5.5 01/06/2015   GAMS 0.9 09/19/2016   MSPIKE Not Observed 09/19/2016   SPEI Comment 09/19/2016   SPECOM Comment 09/19/2016   IGGSERUM 731 09/19/2016   IGGSERUM 763 09/19/2016   IGA 249 09/19/2016   IGA 246 09/19/2016   IGMSERUM 26 09/19/2016   IGMSERUM 32 09/19/2016   IMMELINT (NOTE) 01/06/2015   KPAFRELGTCHN 25.7 (H) 09/19/2016   LAMBDASER 21.8 09/19/2016   KAPLAMBRATIO 1.18 09/19/2016    PENDING LABS:   RADIOGRAPHIC STUDIES:  Dg Bone Survey Met  Result Date: 11/14/2016 CLINICAL DATA:  Multiple myeloma, history prostate cancer, COPD, history lymphoma, hypogammaglobulinemia, former smoker, CHF, coronary artery disease post MI, history bladder cancer EXAM: METASTATIC BONE SURVEY COMPARISON:  CT head and face 07/05/2016, CT chest 04/24/2016 FINDINGS: Diffuse osseous demineralization. RIGHT subclavian Port-A-Cath with tip projecting over SVC. RIGHT subclavian pacemaker leads project over  RIGHT atrium and RIGHT ventricle. Normal heart size and mediastinal contours. Slight prominent central pulmonary arteries, cannot exclude pulmonary arterial  hypertension. Question LEFT nipple shadow versus LEFT lower lung nodular density. Atelectasis versus scarring at LEFT base. Aortic atherosclerosis. Question chronic RIGHT rotator cuff tear. Degenerative disc and facet disease changes at cervical, thoracic, and lumbar spine. Grade 1 spondylolisthesis L5-S1 due to BILATERAL spondylolysis L5. 4 mm anterolisthesis C3-C4. Prior ORIF distal RIGHT radius. No lytic bone lesions are identified to suggest multiple myeloma. No definite sclerotic lesions to suggest metastatic prostate or bladder cancer. IMPRESSION: No radiographic evidence of multiple myeloma or osseous metastatic disease. Osseous demineralization with degenerative changes as above. LEFT basilar atelectasis versus scarring with LEFT lung base nodule versus nipple shadow; repeat PA chest radiograph with nipple markers recommended to exclude pulmonary nodule. Cannot exclude pulmonary arterial hypertension. Electronically Signed   By: Lavonia Dana M.D.   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 He has been on maintenance Revlimid 10 mg for 7 days on and 7 days off without any intolerances to date.  He is past the typical 2 years worth of maintenance therapy, but he was seen at [facility] in 2011 and advised to continue Revlimid.  He is also followed [facility] at [facility] for survivorship, last being seen in 08/2016.    Labs today: CBC diff, CMET, SPEP+IFE, light chain assay.  I personally reviewed and went over laboratory results with the patient.  The results are noted within this dictation.  He will continue maintenance Revlimid. He was on ASA daily, but this was discontinued by his cardiologist. He is now on Eliquis for  paroxysmal atrial fibrillation. He is continued on Plavix as well.    Labs in 8 weeks: CBC diff, CMET, SPEP + IFE, and light chain assay.  Order has been previously placed for skeletal survey.  This has not yet been done.  He is to have this completed today following IVIG treatment.  I have refilled his pain medication after reviewing the [facility] Controlled Substance Reporting System.  Return in 2 months for follow-up.  Addendum: Skeletal survey is completed today.  Results are negative for any osseous lesions.  However, there is concern for pulmonary nodule.  Since he is recovering from recent infection, order is placed for xray of chest with nipple markers in 1-2 weeks.  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  NED.  Hypogammaglobulinemia Hypogammaglobulinemia, receiving IVIG every 4 weeks without significant decreased in documented infections.  Tolerating well.   IVIG today and repeated every 4 weeks.  Supportive therapy plan reviewed.  Recently diagnosed with influenza and secondary pneumonia.  Managed and treated by Dr. Wolfgang Parrish.  He continues with a cough and therefore, I will give Rx for Tessalon.   THERAPY PLAN:  Continue with current treatment.  Continue monthly IVIG.  Continue Revlimid 7 days on and 7 days off.  We will monitor WBC.    All questions were answered. The patient knows to call the clinic with any problems, questions or concerns. We can certainly see the patient much sooner if necessary.  Patient and plan discussed with Dr. Ancil Linsey and she is in agreement with the aforementioned.   This note is electronically signed by: Doy Mince 11/14/2016 3:35 PM

## 2016-11-15 ENCOUNTER — Telehealth: Payer: Self-pay | Admitting: Family Medicine

## 2016-11-15 LAB — IGG, IGA, IGM
IgA: 349 mg/dL (ref 61–437)
IgG (Immunoglobin G), Serum: 968 mg/dL (ref 700–1600)
IgM, Serum: 31 mg/dL (ref 15–143)

## 2016-11-15 LAB — PROTEIN ELECTROPHORESIS, SERUM
A/G Ratio: 1.1 (ref 0.7–1.7)
ALBUMIN ELP: 3.5 g/dL (ref 2.9–4.4)
ALPHA-2-GLOBULIN: 0.9 g/dL (ref 0.4–1.0)
Alpha-1-Globulin: 0.2 g/dL (ref 0.0–0.4)
BETA GLOBULIN: 1 g/dL (ref 0.7–1.3)
Gamma Globulin: 1.1 g/dL (ref 0.4–1.8)
Globulin, Total: 3.2 g/dL (ref 2.2–3.9)
PDF SPE: 0
Total Protein ELP: 6.7 g/dL (ref 6.0–8.5)

## 2016-11-15 LAB — KAPPA/LAMBDA LIGHT CHAINS
KAPPA FREE LGHT CHN: 38.7 mg/L — AB (ref 3.3–19.4)
Kappa, lambda light chain ratio: 1.4 (ref 0.26–1.65)
Lambda free light chains: 27.7 mg/L — ABNORMAL HIGH (ref 5.7–26.3)

## 2016-11-15 LAB — IMMUNOFIXATION ELECTROPHORESIS
IGG (IMMUNOGLOBIN G), SERUM: 961 mg/dL (ref 700–1600)
IgA: 346 mg/dL (ref 61–437)
IgM, Serum: 32 mg/dL (ref 15–143)
TOTAL PROTEIN ELP: 6.6 g/dL (ref 6.0–8.5)

## 2016-11-15 NOTE — Telephone Encounter (Signed)
Called [facility]  Explained that they need to bill Medicare Part B for pt's nebulizer solution

## 2016-11-17 ENCOUNTER — Encounter: Payer: Self-pay | Admitting: Family Medicine

## 2016-11-18 ENCOUNTER — Other Ambulatory Visit: Payer: Self-pay | Admitting: Cardiovascular Disease

## 2016-11-18 DIAGNOSIS — E785 Hyperlipidemia, unspecified: Secondary | ICD-10-CM

## 2016-11-18 NOTE — Telephone Encounter (Signed)
Rx has been sent to the pharmacy electronically. ° °

## 2016-11-19 ENCOUNTER — Encounter: Payer: Self-pay | Admitting: Family Medicine

## 2016-11-19 ENCOUNTER — Ambulatory Visit (INDEPENDENT_AMBULATORY_CARE_PROVIDER_SITE_OTHER): Payer: Medicare Other | Admitting: Family Medicine

## 2016-11-19 VITALS — BP 126/82 | Temp 98.0°F | Ht 67.0 in | Wt 187.0 lb

## 2016-11-19 DIAGNOSIS — J209 Acute bronchitis, unspecified: Secondary | ICD-10-CM

## 2016-11-19 DIAGNOSIS — J329 Chronic sinusitis, unspecified: Secondary | ICD-10-CM | POA: Diagnosis not present

## 2016-11-19 MED ORDER — PREDNISONE 10 MG PO TABS: ORAL_TABLET | ORAL | 0 refills | Status: DC

## 2016-11-19 MED ORDER — CEFDINIR 300 MG PO CAPS: 300.0000 mg | ORAL_CAPSULE | Freq: Two times a day (BID) | ORAL | 0 refills | Status: DC

## 2016-11-19 MED ORDER — HYDROCODONE-HOMATROPINE 5-1.5 MG/5ML PO SYRP: 5.0000 mL | ORAL_SOLUTION | Freq: Every evening | ORAL | 0 refills | Status: DC | PRN

## 2016-11-19 NOTE — Progress Notes (Signed)
   Subjective:    Patient ID: Jeremy Parrish, male    DOB: 11-03-1941, 76 y.o.   MRN: [id number]  HPI Patient arrives for a follow up on cough. Patient was seen 11/08/16 and diagnosed with the flu and given a shot of rocephin and tamiflu and cefzil but still has bad cough and wheezing.  persistent coug bad ,  Productive of yell and gunky phlegm  Dim enrgy  Using machine neb rx every six hrs or so Review of Systems No headache, no major weight loss or weight gain, no chest pain no back pain abdominal pain no change in bowel habits complete ROS otherwise negative     Objective:   Physical Exam Alert mild malaise H&T moderate his congestion pharynx normal lungs no tachypnea no wheezes except during cough heart regular rate and rhythm.       Assessment & Plan:  Impression persistent rhinosinusitis/bronchitis with element of reactive airways. Plan antibiotics prescribed. Steroid taper. Encouraged increased nebulizer use. Warning signs discussed expect slow resolution

## 2016-11-20 ENCOUNTER — Other Ambulatory Visit (HOSPITAL_COMMUNITY): Payer: Self-pay | Admitting: *Deleted

## 2016-11-20 DIAGNOSIS — E876 Hypokalemia: Secondary | ICD-10-CM

## 2016-11-20 MED ORDER — POTASSIUM CHLORIDE CRYS ER 20 MEQ PO TBCR: 20.0000 meq | EXTENDED_RELEASE_TABLET | Freq: Three times a day (TID) | ORAL | 4 refills | Status: DC

## 2016-11-20 MED ORDER — KLOR-CON M20 20 MEQ PO TBCR: 20.0000 meq | EXTENDED_RELEASE_TABLET | Freq: Three times a day (TID) | ORAL | 2 refills | Status: DC

## 2016-11-20 NOTE — Telephone Encounter (Signed)
Verbal order given to refill Klor-con. Refill sent to pt's pharmacy.

## 2016-11-25 ENCOUNTER — Other Ambulatory Visit (HOSPITAL_COMMUNITY): Payer: Self-pay | Admitting: Oncology

## 2016-11-25 DIAGNOSIS — C9001 Multiple myeloma in remission: Secondary | ICD-10-CM

## 2016-11-29 ENCOUNTER — Ambulatory Visit (HOSPITAL_COMMUNITY)
Admission: RE | Admit: 2016-11-29 | Discharge: 2016-11-29 | Disposition: A | Discharge status: Home / Self Care | Payer: Medicare Other | Source: Ambulatory Visit | Attending: Oncology | Admitting: Oncology

## 2016-11-29 DIAGNOSIS — R938 Abnormal findings on diagnostic imaging of other specified body structures: Secondary | ICD-10-CM | POA: Diagnosis not present

## 2016-11-29 DIAGNOSIS — J984 Other disorders of lung: Secondary | ICD-10-CM | POA: Diagnosis not present

## 2016-11-29 DIAGNOSIS — R059 Cough, unspecified: Secondary | ICD-10-CM

## 2016-11-29 DIAGNOSIS — R05 Cough: Secondary | ICD-10-CM | POA: Diagnosis not present

## 2016-12-16 ENCOUNTER — Encounter (HOSPITAL_COMMUNITY): Payer: Self-pay

## 2016-12-16 ENCOUNTER — Encounter (HOSPITAL_COMMUNITY): Payer: Medicare Other | Attending: Oncology

## 2016-12-16 VITALS — BP 117/64 | HR 59 | Temp 98.0°F | Resp 18 | Wt 189.4 lb

## 2016-12-16 DIAGNOSIS — D801 Nonfamilial hypogammaglobulinemia: Secondary | ICD-10-CM

## 2016-12-16 DIAGNOSIS — C9000 Multiple myeloma not having achieved remission: Secondary | ICD-10-CM | POA: Insufficient documentation

## 2016-12-16 DIAGNOSIS — C9001 Multiple myeloma in remission: Secondary | ICD-10-CM | POA: Insufficient documentation

## 2016-12-16 MED ORDER — ACETAMINOPHEN 325 MG PO TABS: 650.0000 mg | ORAL_TABLET | Freq: Four times a day (QID) | ORAL | Status: DC | PRN

## 2016-12-16 MED ORDER — IMMUNE GLOBULIN (HUMAN) 10 GM/100ML IV SOLN
0.4000 g/kg | Freq: Once | INTRAVENOUS | Status: DC
Start: 2016-12-16 — End: 2016-12-16

## 2016-12-16 MED ORDER — IMMUNE GLOBULIN (HUMAN) 10 GM/100ML IV SOLN
0.4000 g/kg | Freq: Once | INTRAVENOUS | Status: DC
  Filled 2016-12-16: qty 400

## 2016-12-16 MED ORDER — SODIUM CHLORIDE 0.9 % IJ SOLN: 10.0000 mL | INTRAMUSCULAR | Status: DC | PRN

## 2016-12-16 MED ORDER — DEXTROSE 5 % IV SOLN
INTRAVENOUS | Status: DC
  Administered 2016-12-16: 10:00:00 via INTRAVENOUS

## 2016-12-16 MED ORDER — HEPARIN SOD (PORK) LOCK FLUSH 100 UNIT/ML IV SOLN
500.0000 [IU] | Freq: Once | INTRAVENOUS | Status: AC | PRN
  Administered 2016-12-16: 500 [IU]

## 2016-12-16 MED ORDER — IMMUNE GLOBULIN (HUMAN) 20 GM/200ML IV SOLN
400.0000 mg/kg | Freq: Once | INTRAVENOUS | Status: AC
  Administered 2016-12-16: 35 g via INTRAVENOUS
  Filled 2016-12-16: qty 50

## 2016-12-16 NOTE — Progress Notes (Signed)
IVIG given today per orders. Patient tolerated it well. Vitals stable and discharged home ambulatory from clinic.follow up as scheduled.

## 2016-12-16 NOTE — Patient Instructions (Signed)
[facility] Discharge Instructions for Patients Receiving Chemotherapy   Beginning January 23rd 2017 lab work for the Cancer Center will be done in the  Main lab at [facility] on 1st floor. If you have a lab appointment with the Cancer Center please come in thru the  Main Entrance and check in at the main information desk   Today you received the following chemotherapy agents   To help prevent nausea and vomiting after your treatment, we encourage you to take your nausea medication     If you develop nausea and vomiting, or diarrhea that is not controlled by your medication, call the clinic.  The clinic phone number is [phone]. Office hours are Monday-Friday 8:30am-5:00pm.  BELOW ARE SYMPTOMS THAT SHOULD BE REPORTED IMMEDIATELY:  *FEVER GREATER THAN 101.0 F  *CHILLS WITH OR WITHOUT FEVER  NAUSEA AND VOMITING THAT IS NOT CONTROLLED WITH YOUR NAUSEA MEDICATION  *UNUSUAL SHORTNESS OF BREATH  *UNUSUAL BRUISING OR BLEEDING  TENDERNESS IN MOUTH AND THROAT WITH OR WITHOUT PRESENCE OF ULCERS  *URINARY PROBLEMS  *BOWEL PROBLEMS  UNUSUAL RASH Items with * indicate a potential emergency and should be followed up as soon as possible. If you have an emergency after office hours please contact your primary care physician or go to the nearest emergency department.  Please call the clinic during office hours if you have any questions or concerns.   You may also contact the Patient Navigator at [phone] should you have any questions or need assistance in obtaining follow up care.      Resources For Cancer Patients and their Caregivers ? American Cancer Society: Can assist with transportation, wigs, general needs, runs Look Good Feel Better.        1-888-227-6333 ? Cancer Care: Provides financial assistance, online support groups, medication/co-pay assistance.  1-800-813-HOPE (4673) ? [facility] Assists [place] cancer  patients and their families through emotional , educational and financial support.  336-427-4357 ? [place] DSS Where to apply for food stamps, Medicaid and utility assistance. 336-342-1394 ? [facility]: Transportation to medical appointments. 336-347-2287 ? Social Security Administration: May apply for disability if have a Stage IV cancer. 336-342-7796 1-800-772-1213 ? [place] Aging, Disability and Transit Services: Assists with nutrition, care and transit needs. 336-349-2343         

## 2016-12-19 ENCOUNTER — Other Ambulatory Visit (HOSPITAL_COMMUNITY): Payer: Self-pay | Admitting: Oncology

## 2016-12-19 DIAGNOSIS — C9001 Multiple myeloma in remission: Secondary | ICD-10-CM

## 2016-12-19 MED ORDER — LENALIDOMIDE 10 MG PO CAPS: ORAL_CAPSULE | ORAL | 0 refills | Status: DC

## 2016-12-25 ENCOUNTER — Ambulatory Visit (INDEPENDENT_AMBULATORY_CARE_PROVIDER_SITE_OTHER): Payer: Medicare Other | Admitting: Cardiovascular Disease

## 2016-12-25 ENCOUNTER — Encounter: Payer: Self-pay | Admitting: Cardiovascular Disease

## 2016-12-25 VITALS — BP 122/68 | HR 69 | Ht 67.0 in | Wt 193.8 lb

## 2016-12-25 DIAGNOSIS — I480 Paroxysmal atrial fibrillation: Secondary | ICD-10-CM

## 2016-12-25 DIAGNOSIS — E785 Hyperlipidemia, unspecified: Secondary | ICD-10-CM

## 2016-12-25 DIAGNOSIS — I2510 Atherosclerotic heart disease of native coronary artery without angina pectoris: Secondary | ICD-10-CM | POA: Diagnosis not present

## 2016-12-25 DIAGNOSIS — Z7901 Long term (current) use of anticoagulants: Secondary | ICD-10-CM

## 2016-12-25 DIAGNOSIS — D801 Nonfamilial hypogammaglobulinemia: Secondary | ICD-10-CM | POA: Diagnosis not present

## 2016-12-25 DIAGNOSIS — I10 Essential (primary) hypertension: Secondary | ICD-10-CM | POA: Diagnosis not present

## 2016-12-25 DIAGNOSIS — I495 Sick sinus syndrome: Secondary | ICD-10-CM

## 2016-12-25 DIAGNOSIS — R4701 Aphasia: Secondary | ICD-10-CM | POA: Diagnosis not present

## 2016-12-25 DIAGNOSIS — Z950 Presence of cardiac pacemaker: Secondary | ICD-10-CM

## 2016-12-25 LAB — CUP PACEART INCLINIC DEVICE CHECK
Brady Statistic AP VP Percent: 1.36 %
Brady Statistic AP VS Percent: 91.04 %
Brady Statistic AS VP Percent: 0.09 %
Brady Statistic AS VS Percent: 7.51 %
Brady Statistic RV Percent Paced: 1.44 %
Implantable Lead Implant Date: 20120910
Implantable Lead Implant Date: 20120910
Implantable Lead Location: 753859
Lead Channel Impedance Value: 392 Ohm
Lead Channel Pacing Threshold Amplitude: 1 V
Lead Channel Pacing Threshold Pulse Width: 0.4 ms
Lead Channel Pacing Threshold Pulse Width: 0.6 ms
Lead Channel Sensing Intrinsic Amplitude: 2.439 mV
Lead Channel Sensing Intrinsic Amplitude: 5.05 mV
Lead Channel Setting Pacing Amplitude: 2 V
MDC IDC LEAD LOCATION: 753860
MDC IDC MSMT BATTERY VOLTAGE: 2.97 V
MDC IDC MSMT LEADCHNL RA IMPEDANCE VALUE: 400 Ohm
MDC IDC MSMT LEADCHNL RV PACING THRESHOLD AMPLITUDE: 1 V
MDC IDC PG IMPLANT DT: 20120910
MDC IDC SESS DTM: 20180214154916
MDC IDC SET LEADCHNL RV PACING AMPLITUDE: 2.5 V
MDC IDC SET LEADCHNL RV PACING PULSEWIDTH: 0.6 ms
MDC IDC SET LEADCHNL RV SENSING SENSITIVITY: 0.9 mV
MDC IDC STAT BRADY RA PERCENT PACED: 88.64 %

## 2016-12-25 MED ORDER — CEFPROZIL 500 MG PO TABS: 500.0000 mg | ORAL_TABLET | Freq: Two times a day (BID) | ORAL | 0 refills | Status: AC

## 2016-12-25 NOTE — Patient Instructions (Signed)
Dr Sallyanne Kuster has recommended making the following medication changes: 1. TAKE Cefzil 500 mg twice daily for 7 days  Remote monitoring is used to monitor your Pacemaker of ICD from home. This monitoring reduces the number of office visits required to check your device to one time per year. It allows [place] to keep an eye on the functioning of your device to ensure it is working properly. You are scheduled for a device check from home on Wednesday, May 16th, 2018. You may send your transmission at any time that day. If you have a wireless device, the transmission will be sent automatically. After your physician reviews your transmission, you will receive a postcard with your next transmission date.  Dr Sallyanne Kuster recommends that you schedule a follow-up appointment in 12 months with a pacemaker check. You will receive a reminder letter in the mail two months in advance. If you don't receive a letter, please call our office to schedule the follow-up appointment.  If you need a refill on your cardiac medications before your next appointment, please call your pharmacy.

## 2016-12-25 NOTE — Progress Notes (Signed)
Patient ID: Jeremy Parrish, male   DOB: 1941-11-10, 76 y.o.   MRN: [id number]    Cardiology Office Note    Date:  12/25/2016   ID:  Jeremy Parrish, DOB [birth year], MRN [id number]  PCP:  [name], MD  Cardiologist:  Quay Burow, M.D. Sanda Klein, MD   Chief Complaint  Patient presents with  . Follow-up    History of Present Illness:  Jeremy Parrish is a 76 y.o. male who returns for pacemaker follow-up. 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   He had a stroke in Nov 3559, complicated by significant residual expressive aphasia. He was treated with clopidogrel for many years. No atrial fibrillation was detected on his pacemaker at the time of the stroke, but atrial fibrillation was subsequently detected in 2017 and oral anticoagulants were added to clopidogrel.   Since his last appointment he has not had new medical problems but has had a couple of non-ST falls. Once he tripped over his unbound shoelaces. Another time he tripped on the stairs from his back. One of these events, in August, was associated with head impact, a large black eye and he went to the emergency room. There was no evidence of intracranial bleeding on head CT. He does not appear to have any true balance problems but was simply clumsy on a couple of occasions. He denies loss of consciousness either before or after falling.  He has made improvements with his expressive aphasia and is actually able to speak in fairly lengthy and complex sentences, although often with poorly pronounced words. It is very easy to communicate with him despite the aphasia. Speech deteriorates when he is emotional.  He has not been aware of any palpitations. He has not had syncope and denies exertional dyspnea or  angina. He denies any bleeding complications. He has not had any new focal neurological complaints. 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  He has had a protracted course with rhinosinusitis. He has persistent head congestion and is now having thick yellow secretions. He had similar problems in late December, improved after cephalosporins. He does not have wheezing and denies dyspnea. Over-the-counter medications have not helped. He does have headaches. He requests an antibiotic.  Pacemaker interrogation shows normal device function. The burden of atrial fibrillation is very low. In the last year he has had one episode lasting 30 minutes. Otherwise he is 92% atrial paced ventricular sensed rhythm. Pacing in the ventricle occurs only 1.5% of the time. There are very rare episodes of nonsustained ventricular tachycardia, none since last November. He remains very active. Battery voltage is at 2.97 V (ERI 2.81 V). His device is a Medtronic Revo MRI conditional device.  Past Medical History:  Diagnosis Date  . Anemia   . Aortic aneurysm of unspecified site without mention of rupture   . Arthritis   . Bladder neck contracture   . Cancer ([place])   . Cerebral atherosclerosis    Carotid Doppler, 02/16/2013 - Bilateral Proximal ICAs,demonstrate mild plaque w/o evidence of significant diameter reduction, dissection, or any other vascular abnormality  . CHF (congestive heart failure) ([place])   . Complication of anesthesia   . COPD (chronic obstructive pulmonary disease) ([place])   . Coronary artery disease   .  Depression   . Esophageal reflux   . Heart disease   . Heart murmur   . Hx of bladder cancer 10/07/2012  . Hyperlipidemia   . Hypertension   . 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 Intestinovesical fistula   . Kidney stones    history  . Lung  mass   . Multiple myeloma   . Myocardial infarction    '96  . Non Hodgkin's lymphoma ([place])   . Paroxysmal atrial fibrillation ([place]) 01/02/2016  . Peripheral arterial disease ([place])   . Personal history of other diseases of circulatory system   . PONV (postoperative nausea and vomiting)   . Prostate cancer ([place]) 2000  . Shingles   . Shortness of breath   . Sleep apnea    05-02-14 cpap , not yet used- suggested settings 5  . Stroke [facility]) 2013   Speech.    Past Surgical History:  Procedure Laterality Date  . BLADDER SURGERY    . BONE MARROW TRANSPLANT  2011  . COLON SURGERY     colon resection  . COLONOSCOPY N/A 01/01/2013   Procedure: COLONOSCOPY;  Surgeon: Rogene Houston, MD;  Location: [facility] END[facility]ITE;  Service: Endoscopy;  Laterality: N/A;  825-moved to [place] notified pt  . CORONARY ANGIOPLASTY  06/24/2000   PCI and stenting in mid & proximal RCA  . heart stents x 5  1999  . INGUINAL HERNIA REPAIR Right 05/04/2014   Procedure: OPEN RIGHT INGUINAL HERNIA REPAIR with mesh;  Surgeon: Edward Jolly, MD;  Location: [facility] ORS;  Service: General;  Laterality: Right;  . INSERT / REPLACE / REMOVE PACEMAKER    . left ear skin cancer removed    . NM MYOCAR PERF WALL MOTION  11/27/2007   inferior scar  . PACEMAKER INSERTION  07/22/2011   Medtronic  . PORTACATH PLACEMENT  07/26/2009   right chest  . PROSTATE SURGERY    . Rotator    . ROTATOR CUFF REPAIR Right   . SHOULDER ARTHROSCOPY WITH SUBACROMIAL DECOMPRESSION Right 07/21/2013   Procedure: RIGHT SHOULDER ARTHROSCOPY WITH SUBACROMIAL DECOMPRESSION AND DEBRIDEMENT & Injection of Left Shoulder;  Surgeon: Alta Corning, MD;  Location: [facility];  Service: Orthopedics;  Laterality: Right;  . TEE WITHOUT CARDIOVERSION  10/13/2012   Procedure: TRANSESOPHAGEAL ECHOCARDIOGRAM (TEE);  Surgeon: Sanda Klein, MD;  Location: [facility];  Service: Cardiovascular;  Laterality: N/A;  pat/kay/echo notified  . US ECHOCARDIOGRAPHY  06/19/2011   RV mildly  dilated,mild to mod. MR,mild AI,mild PI  . WRIST SURGERY     right    Outpatient Medications Prior to Visit  Medication Sig Dispense Refill  . albuterol (PROVENTIL HFA;VENTOLIN HFA) 108 (90 Base) MCG/ACT inhaler Inhale 2 puffs into the lungs every 6 (six) hours as needed for wheezing or shortness of breath. 1 Inhaler 3  . albuterol (PROVENTIL) (2.5 MG/3ML) 0.083% nebulizer solution Use via neb q 4 hours prn wheezing (Patient taking differently: Take 2.5 mg by nebulization every 4 (four) hours as needed for wheezing or shortness of breath. ) 75 vial 2  . ALPRAZolam (XANAX) 0.5 MG tablet Take 1-2 tablets (0.5-1 mg total) by mouth at bedtime as needed for anxiety. 90 tablet 1  . amLODipine (NORVASC) 10 MG tablet TAKE 1 TABLET (10 MG TOTAL) BY MOUTH EVERY MORNING. 90 tablet 1  . apixaban (ELIQUIS) 5 MG TABS tablet Take 1 tablet (5 mg total) by mouth 2 (two) times daily.  180 tablet 01  . budesonide (PULMICORT) 0.5 MG/2ML nebulizer solution Take 2 mLs (0.5 mg total) by nebulization 2 (two) times daily. (Patient taking differently: Take 0.5 mg by nebulization daily as needed (for shortness of breath). ) 120 mL 2  . chlorzoxazone (PARAFON) 500 MG tablet Take 1 tablet (500 mg total) by mouth 3 (three) times daily as needed for muscle spasms. 60 tablet 2  . citalopram (CELEXA) 40 MG tablet TAKE 1 TABLET (40 MG TOTAL) BY MOUTH DAILY. 90 tablet 1  . clopidogrel (PLAVIX) 75 MG tablet TAKE 1 TABLET BY MOUTH EVERY DAY 90 tablet 3  . furosemide (LASIX) 20 MG tablet USE SPARINGLY AS NEEDED FOR EDEMA NO MORE THAN ONE PER WEEK 21 tablet 0  . HYDROcodone-acetaminophen (NORCO) 10-325 MG tablet Take 1 tablet by mouth every 4 (four) hours as needed. 60 tablet 0  . HYDROcodone-homatropine (HYCODAN) 5-1.5 MG/5ML syrup Take 5 mLs by mouth at bedtime as needed for cough. 90 mL 0  . ketoconazole (NIZORAL) 2 % cream APPLY 1 APPLICATION TOPICALLY 2 (TWO) TIMES DAILY.  4  . KLOR-CON M20 20 MEQ tablet Take 1 tablet (20 mEq  total) by mouth 3 (three) times daily. Reported on 11/15/2015 270 tablet 2  . lenalidomide (REVLIMID) 10 MG capsule TAKE 1 CAPSULE BY MOUTH DAILY FOR 7 DAYS ON FOLLOWED BY 7 DAYS OFF. THEN REPEAT CYCLE. 14 capsule 0  . levocetirizine (XYZAL) 5 MG tablet Take 5 mg by mouth daily as needed for allergies.     Marland Kitchen LYRICA 75 MG capsule     . meclizine (ANTIVERT) 25 MG tablet Take 1 tablet (25 mg total) by mouth 3 (three) times daily as needed for dizziness. 30 tablet 0  . nitroGLYCERIN (NITROSTAT) 0.4 MG SL tablet Place 1 tablet (0.4 mg total) under the tongue every 5 (five) minutes as needed for chest pain. 25 tablet 0  . omeprazole (PRILOSEC) 20 MG capsule TAKE 1 CAPSULE (20 MG TOTAL) BY MOUTH DAILY. 90 capsule 1  . potassium chloride SA (K-DUR,KLOR-CON) 20 MEQ tablet Take 1 tablet (20 mEq total) by mouth 3 (three) times daily. 270 tablet 4  . predniSONE (DELTASONE) 10 MG tablet 4 tablets daily for 3 days then 3 tablets daily for 3 days 21 tablet 0  . simvastatin (ZOCOR) 20 MG tablet TAKE 1 TABLET (20 MG TOTAL) BY MOUTH AT BEDTIME. 90 tablet 1  . cefdinir (OMNICEF) 300 MG capsule Take 1 capsule (300 mg total) by mouth 2 (two) times daily. 20 capsule 0  . cefPROZIL (CEFZIL) 500 MG tablet Take 1 tablet (500 mg total) by mouth 2 (two) times daily. 20 tablet 0  . oseltamivir (TAMIFLU) 75 MG capsule Take 1 capsule (75 mg total) by mouth 2 (two) times daily. 10 capsule 0  . benzonatate (TESSALON) 100 MG capsule TAKE ONE CAPSULE BY MOUTH 3 TIMES A DAY AS NEEDED FOR COUGH  0  . benzonatate (TESSALON) 200 MG capsule Take 1 capsule (200 mg total) by mouth 3 (three) times daily as needed for cough. (Patient not taking: Reported on 12/25/2016) 30 capsule 0   Facility-Administered Medications Prior to Visit  Medication Dose Route Frequency Provider Last Rate Last Dose  . acetaminophen (TYLENOL) tablet 650 mg  650 mg Oral Q6H PRN Baird Cancer, PA-C   650 mg at 12/09/14 0855  . sodium chloride 0.9 % injection 10 mL   10 mL Intracatheter PRN Baird Cancer, PA-C   10 mL at 12/09/14 [phone]  Allergies:   Morphine and related and Tape   Social History   Social History  . Marital status: Married    Spouse name: Jeremy Parrish  . Number of children: 3  . Years of education: 9th   Occupational History  . retired     Social History Main Topics  . Smoking status: Former Smoker    Packs/day: 1.00    Years: 20.00    Types: Cigarettes    Quit date: 11/14/1994  . Smokeless tobacco: Never Used  . Alcohol use No     Comment: previously drank but none for at least 15 years.  . Drug use: No  . Sexual activity: Not Asked   Other Topics Concern  . None   Social History Narrative   Patient lives at home spouse.   Caffeine Use: Occasionally     Family History:  The patient's family history includes Cancer in his brother and father; Heart attack in his sister; Heart disease in his brother, father, and sister; Hyperlipidemia in his sister; Hypertension in his mother and sister.   ROS:   Please see the history of present illness.    ROS All other systems reviewed and are negative.   PHYSICAL EXAM:   VS:  BP 122/68 (BP Location: Right Arm, Patient Position: Sitting, Cuff Size: Normal)   Pulse 69   Ht 5' 7" (1.702 m)   Wt 87.9 kg (193 lb 12.8 oz)   BMI 30.35 kg/m    GEN: Well nourished, well developed, in no acute distress  HEENT: normal  Neck: no JVD, carotid bruits, or masses Cardiac: RRR; no murmurs, rubs, or gallops,no edema , healthy left subclavian pacemaker site Respiratory:  clear to auscultation bilaterally, normal work of breathing GI: soft, nontender, nondistended, + BS MS: no deformity or atrophy  Skin: warm and dry, no rash Neuro:  Alert and Oriented x 3, Strength and sensation are intact Psych: euthymic mood, full affect  Wt Readings from Last 3 Encounters:  12/25/16 87.9 kg (193 lb 12.8 oz)  12/16/16 85.9 kg (189 lb 6.4 oz)  11/19/16 84.8 kg (187 lb)      Studies/Labs Reviewed:    EKG:  EKG is ordered today and shows atrial paced ventricular sensed rhythm with long AV delay 228 ms, nonspecific intraventricular conduction delay most closely resembling 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 Borderline QRS at 120 ms.  Recent Labs: 11/14/2016: ALT 29; BUN 11; Creatinine, Ser 0.99; Hemoglobin 12.5; Platelets 137; Potassium 4.1; Sodium 138   Lipid Panel    Component Value Date/Time   CHOL 102 11/14/2016 0945   TRIG 118 11/14/2016 0945   HDL 27 (L) 11/14/2016 0945   CHOLHDL 3.8 11/14/2016 0945   VLDL 24 11/14/2016 0945   LDLCALC 51 11/14/2016 0945     ASSESSMENT:    1. Paroxysmal atrial fibrillation (HCC)   2. SSS (sick sinus syndrome) ([place])   3. 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 Long term current use of anticoagulant   5. Atherosclerosis of native coronary artery of native heart without angina pectoris   6. Expressive aphasia   7. Hyperlipidemia with target LDL less than 100   8. Essential hypertension   9. Hypogammaglobulinemia ([place])      PLAN:  In order of problems listed above:  1. PAF: This was not documented around the time of his stroke in 2013. However  it does place him at substantial risk for recurrent stroke. CHADSVasc score 6 (age, CAD, LV dysfunction, HTN, previous CVA). Continue Eliquis and clopidogrel. The burden of atrial fibrillation is extremely low. Specific antiarrhythmic therapy does not appear to be indicated 2. SSS: Underlying rhythm is marked sinus bradycardia. Heart rate histogram distribution suggests appropriate sensor settings on his pacemaker. 3. PPM: Normal pacemaker function. 4. Eliquis: His 2 falls are quite concerning since he is taken combination anticoagulant and antiplatelet agents. They do appear to be a consequence of clumsiness and bad luck, without a clear  pattern of frailty or poor balance. Monitor carefully. We may have to reassess the wisdom of oral anticoagulants if he falls again. 5. CAD: asymptomatic, no angina 6. S/P CVA, improving 7. HLP: LDL at target 8. HTN: excellent systolic blood pressure, no changes made today. 9. Sinusitis: He is immunosuppressed due to his hematological condition and its treatment. I felt compelled to give him another short course of antibiotics.    Medication Adjustments/Labs and Tests Ordered: Current medicines are reviewed at length with the patient today.  Concerns regarding medicines are outlined above.  Medication changes, Labs and Tests ordered today are listed in the Patient Instructions below. Patient Instructions  Dr Sallyanne Kuster has recommended making the following medication changes: 1. TAKE Cefzil 500 mg twice daily for 7 days  Remote monitoring is used to monitor your Pacemaker of ICD from home. This monitoring reduces the number of office visits required to check your device to one time per year. It allows [place] to keep an eye on the functioning of your device to ensure it is working properly. You are scheduled for a device check from home on Wednesday, May 16th, 2018. You may send your transmission at any time that day. If you have a wireless device, the transmission will be sent automatically. After your physician reviews your transmission, you will receive a postcard with your next transmission date.  Dr Sallyanne Kuster recommends that you schedule a follow-up appointment in 12 months with a pacemaker check. You will receive a reminder letter in the mail two months in advance. If you don't receive a letter, please call our office to schedule the follow-up appointment.  If you need a refill on your cardiac medications before your next appointment, please call your pharmacy.      Signed, Sanda Klein, MD  12/25/2016 5:45 PM    [address], [place], [place]   [address] Phone: [phone]; Fax: [phone]

## 2017-01-06 ENCOUNTER — Other Ambulatory Visit: Payer: Self-pay | Admitting: Cardiovascular Disease

## 2017-01-10 ENCOUNTER — Ambulatory Visit (INDEPENDENT_AMBULATORY_CARE_PROVIDER_SITE_OTHER): Payer: Medicare Other | Admitting: Family Medicine

## 2017-01-10 ENCOUNTER — Encounter: Payer: Self-pay | Admitting: Family Medicine

## 2017-01-10 VITALS — BP 122/76 | Temp 98.2°F | Ht 67.0 in | Wt 194.0 lb

## 2017-01-10 DIAGNOSIS — I2510 Atherosclerotic heart disease of native coronary artery without angina pectoris: Secondary | ICD-10-CM | POA: Diagnosis not present

## 2017-01-10 DIAGNOSIS — B356 Tinea cruris: Secondary | ICD-10-CM

## 2017-01-10 MED ORDER — GRISEOFULVIN MICROSIZE 500 MG PO TABS: 500.0000 mg | ORAL_TABLET | Freq: Every day | ORAL | 0 refills | Status: DC

## 2017-01-10 MED ORDER — KETOCONAZOLE 2 % EX CREA: TOPICAL_CREAM | CUTANEOUS | 2 refills | Status: DC

## 2017-01-10 NOTE — Progress Notes (Signed)
   Subjective:    Patient ID: Jeremy Parrish, male    DOB: [birth year], 76 y.o.   MRN: [id number]  Rash  This is a new problem. Episode onset: one week. Location: groin area. Treatments tried: vaseline.  Patient relates a rash been gone on her past couple weeks denies any fever chills sweats denies nausea vomiting diarrhea states overall energy level is good  Rash itches Burns tried Vaseline on it nothing seemed to help  Review of Systems  Skin: Positive for rash.  Please see above     Objective:   Physical Exam lungs are clear hearts regular HEENT benign neck no masses      Assessment & Plan:  Rash-tinea-supportive measures discussed follow-up if progressive troubles follow-up if ongoing issues ketoconazole, griseofulvin for the next 2 weeks

## 2017-01-13 ENCOUNTER — Encounter (HOSPITAL_BASED_OUTPATIENT_CLINIC_OR_DEPARTMENT_OTHER): Payer: Medicare Other

## 2017-01-13 ENCOUNTER — Encounter (HOSPITAL_COMMUNITY): Payer: Medicare Other | Attending: Oncology | Admitting: Oncology

## 2017-01-13 ENCOUNTER — Encounter (HOSPITAL_COMMUNITY): Payer: Self-pay

## 2017-01-13 ENCOUNTER — Encounter (HOSPITAL_COMMUNITY): Payer: Medicare Other

## 2017-01-13 VITALS — BP 138/71 | HR 61 | Temp 98.2°F | Resp 18

## 2017-01-13 VITALS — BP 135/89 | HR 64 | Temp 97.8°F | Resp 18 | Wt 193.5 lb

## 2017-01-13 DIAGNOSIS — I255 Ischemic cardiomyopathy: Secondary | ICD-10-CM

## 2017-01-13 DIAGNOSIS — D801 Nonfamilial hypogammaglobulinemia: Secondary | ICD-10-CM

## 2017-01-13 DIAGNOSIS — C9001 Multiple myeloma in remission: Secondary | ICD-10-CM

## 2017-01-13 DIAGNOSIS — Z8572 Personal history of non-Hodgkin lymphomas: Secondary | ICD-10-CM

## 2017-01-13 DIAGNOSIS — C9000 Multiple myeloma not having achieved remission: Secondary | ICD-10-CM | POA: Insufficient documentation

## 2017-01-13 LAB — CBC WITH DIFFERENTIAL/PLATELET
BASOS ABS: 0 10*3/uL (ref 0.0–0.1)
BASOS PCT: 1 %
Eosinophils Absolute: 0.2 10*3/uL (ref 0.0–0.7)
Eosinophils Relative: 4 %
HEMATOCRIT: 36.4 % — AB (ref 39.0–52.0)
HEMOGLOBIN: 12.3 g/dL — AB (ref 13.0–17.0)
LYMPHS PCT: 36 %
Lymphs Abs: 1.4 10*3/uL (ref 0.7–4.0)
MCH: 29.6 pg (ref 26.0–34.0)
MCHC: 33.8 g/dL (ref 30.0–36.0)
MCV: 87.5 fL (ref 78.0–100.0)
Monocytes Absolute: 0.6 10*3/uL (ref 0.1–1.0)
Monocytes Relative: 15 %
NEUTROS ABS: 1.7 10*3/uL (ref 1.7–7.7)
NEUTROS PCT: 44 %
Platelets: ADEQUATE 10*3/uL (ref 150–400)
RBC: 4.16 MIL/uL — AB (ref 4.22–5.81)
RDW: 16.3 % — ABNORMAL HIGH (ref 11.5–15.5)
WBC: 3.9 10*3/uL — ABNORMAL LOW (ref 4.0–10.5)

## 2017-01-13 LAB — COMPREHENSIVE METABOLIC PANEL
ALT: 13 U/L — ABNORMAL LOW (ref 17–63)
AST: 26 U/L (ref 15–41)
Albumin: 3.5 g/dL (ref 3.5–5.0)
Alkaline Phosphatase: 62 U/L (ref 38–126)
Anion gap: 7 (ref 5–15)
BILIRUBIN TOTAL: 0.4 mg/dL (ref 0.3–1.2)
BUN: 7 mg/dL (ref 6–20)
CO2: 24 mmol/L (ref 22–32)
Calcium: 9 mg/dL (ref 8.9–10.3)
Chloride: 108 mmol/L (ref 101–111)
Creatinine, Ser: 0.91 mg/dL (ref 0.61–1.24)
GFR calc Af Amer: >60 mL/min (ref >60)
Glucose, Bld: 117 mg/dL — ABNORMAL HIGH (ref 65–99)
POTASSIUM: 3.3 mmol/L — AB (ref 3.5–5.1)
Sodium: 139 mmol/L (ref 135–145)
TOTAL PROTEIN: 6.7 g/dL (ref 6.5–8.1)

## 2017-01-13 LAB — VITAMIN B12: VITAMIN B 12: 147 pg/mL — AB (ref 180–914)

## 2017-01-13 LAB — LACTATE DEHYDROGENASE: LDH: 141 U/L (ref 98–192)

## 2017-01-13 MED ORDER — DEXTROSE 5 % IV SOLN
INTRAVENOUS | Status: DC
  Administered 2017-01-13: 10:00:00 via INTRAVENOUS

## 2017-01-13 MED ORDER — ACETAMINOPHEN 325 MG PO TABS
ORAL_TABLET | ORAL | Status: AC
  Filled 2017-01-13: qty 2

## 2017-01-13 MED ORDER — HEPARIN SOD (PORK) LOCK FLUSH 100 UNIT/ML IV SOLN
INTRAVENOUS | Status: AC
  Filled 2017-01-13: qty 5

## 2017-01-13 MED ORDER — ACETAMINOPHEN 325 MG PO TABS
650.0000 mg | ORAL_TABLET | Freq: Once | ORAL | Status: DC
Start: 2017-01-13 — End: 2017-01-13

## 2017-01-13 MED ORDER — IMMUNE GLOBULIN (HUMAN) 20 GM/200ML IV SOLN
400.0000 mg/kg | Freq: Once | INTRAVENOUS | Status: AC
  Administered 2017-01-13: 35 g via INTRAVENOUS
  Filled 2017-01-13: qty 200

## 2017-01-13 MED ORDER — HEPARIN SOD (PORK) LOCK FLUSH 100 UNIT/ML IV SOLN
500.0000 [IU] | Freq: Once | INTRAVENOUS | Status: AC
  Administered 2017-01-13: 500 [IU] via INTRAVENOUS

## 2017-01-13 NOTE — Patient Instructions (Signed)
[place] at [facility] Discharge Instructions  RECOMMENDATIONS MADE BY THE CONSULTANT AND ANY TEST RESULTS WILL BE SENT TO YOUR REFERRING PHYSICIAN.  You were seen today by Dr. Twana First You will get your IVIG infusion today Follow up in 4 week with next infusion and lab work See Amy up front for appointments   Thank you for choosing [place] at [facility] to provide your oncology and hematology care.  To afford each patient quality time with our provider, please arrive at least 15 minutes before your scheduled appointment time.    If you have a lab appointment with the [facility] please come in thru the  Main Entrance and check in at the main information desk  You need to re-schedule your appointment should you arrive 10 or more minutes late.  We strive to give you quality time with our providers, and arriving late affects you and other patients whose appointments are after yours.  Also, if you no show three or more times for appointments you may be dismissed from the clinic at the providers discretion.     Again, thank you for choosing [facility].  Our hope is that these requests will decrease the amount of time that you wait before being seen by our physicians.       _____________________________________________________________  Should you have questions after your visit to [facility], please contact our office at [phone] between the hours of 8:30 a.m. and 4:30 p.m.  Voicemails left after 4:30 p.m. will not be returned until the following business day.  For prescription refill requests, have your pharmacy contact our office.       Resources For Cancer Patients and their Caregivers ? American Cancer Society: Can assist with transportation, wigs, general needs, runs Look Good Feel Better.        ([phone] ? Cancer Care: Provides financial assistance, online support groups,  medication/co-pay assistance.  1-800-813-HOPE 551-327-8229) ? [place] Assists [place] cancer patients and their families through emotional , educational and financial support.  [phone] ? [place] DSS Where to apply for food stamps, Medicaid and utility assistance. (475)034-7863 ? RCATS: Transportation to medical appointments. (905)291-2213 ? Social Security Administration: May apply for disability if have a Stage IV cancer. 351-051-5753 [phone] ? [employer], Disability and Transit Services: Assists with nutrition, care and transit needs. [place] Support Programs: @10RELATIVEDAYS @ > Cancer Support Group  2nd Tuesday of the month 1pm-2pm, Journey Room  > Creative Journey  3rd Tuesday of the month 1130am-1pm, Journey Room  > Look Good Feel Better  1st Wednesday of the month 10am-12 noon, Journey Room (Call [place] to register [phone])

## 2017-01-13 NOTE — Patient Instructions (Signed)
[facility] at [facility] Discharge Instructions  RECOMMENDATIONS MADE BY THE CONSULTANT AND ANY TEST RESULTS WILL BE SENT TO YOUR REFERRING PHYSICIAN.  IVIG today  Thank you for choosing [facility] at [facility] to provide your oncology and hematology care.  To afford each patient quality time with our provider, please arrive at least 15 minutes before your scheduled appointment time.    If you have a lab appointment with the Cancer Center please come in thru the  Main Entrance and check in at the main information desk  You need to re-schedule your appointment should you arrive 10 or more minutes late.  We strive to give you quality time with our providers, and arriving late affects you and other patients whose appointments are after yours.  Also, if you no show three or more times for appointments you may be dismissed from the clinic at the providers discretion.     Again, thank you for choosing [facility].  Our hope is that these requests will decrease the amount of time that you wait before being seen by our physicians.       _____________________________________________________________  Should you have questions after your visit to [facility], please contact our office at [phone] between the hours of 8:30 a.m. and 4:30 p.m.  Voicemails left after 4:30 p.m. will not be returned until the following business day.  For prescription refill requests, have your pharmacy contact our office.       Resources For Cancer Patients and their Caregivers ? American Cancer Society: Can assist with transportation, wigs, general needs, runs Look Good Feel Better.        1-888-227-6333 ? Cancer Care: Provides financial assistance, online support groups, medication/co-pay assistance.  1-800-813-HOPE (4673) ? [facility] Assists [place] cancer patients and their families through emotional ,  educational and financial support.  336-427-4357 ? [place] DSS Where to apply for food stamps, Medicaid and utility assistance. 336-342-1394 ? RCATS: Transportation to medical appointments. 336-347-2287 ? Social Security Administration: May apply for disability if have a Stage IV cancer. 336-342-7796 1-800-772-1213 ? [place] Aging, Disability and Transit Services: Assists with nutrition, care and transit needs. 336-349-2343  Cancer Center Support Programs: @10RELATIVEDAYS@ > Cancer Support Group  2nd Tuesday of the month 1pm-2pm, Journey Room  > Creative Journey  3rd Tuesday of the month 1130am-1pm, Journey Room  > Look Good Feel Better  1st Wednesday of the month 10am-12 noon, Journey Room (Call American Cancer Society to register [phone])    

## 2017-01-13 NOTE — Progress Notes (Signed)
Patient tolerated infusion well.  VSS.  Patient ambulatory and stable upon discharge from clinic.   

## 2017-01-13 NOTE — Progress Notes (Signed)
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 Marcell Anger. 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 In 2011 he was diagnosed with IgG lambda multiple myeloma and treated with 4 cycles of Velcade and dexamethasone followed by autologous peripheral blood stem cell transplant and currently remains on maintenance Revlimid and also monthly intravenous IgG.  Zometa 4 mg intravenously monthly was discontinued with last treatment on 08/18/2014.   DIAGNOSIS: IgG lambda Myeloma   CURRENT THERAPY: IVIG  Revlimid 10 mg 7 on/7 off  INTERVAL HISTORY: [place] 76 y.o. 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  He has been doing well and takes his Revlimid 1 week on, 1 week off. He has some SOB occasionally, as well as some fatigue. Denies fever, chest pain, abdominal pain, loss of appetite, or any other concerns.   Past Medical History:  Diagnosis Date  . Anemia   . Aortic aneurysm of unspecified site without mention of rupture   . Arthritis   . Bladder neck contracture   . Cancer ([place])   . Cerebral atherosclerosis    Carotid Doppler, 02/16/2013 - Bilateral Proximal ICAs,demonstrate mild plaque w/o evidence of significant diameter reduction, dissection, or any other vascular abnormality  . CHF (congestive heart failure) ([place])   . Complication of anesthesia   . COPD (chronic obstructive pulmonary  disease) ([place])   . Coronary artery disease   . Depression   . Esophageal reflux   . Heart disease   . Heart murmur   . Hx of bladder cancer 10/07/2012  . Hyperlipidemia   . Hypertension   . Hypogammaglobulinemia ([place]) 09/28/2012   Secondary to Lymphoma and Multiple Myeloma and their treatments  . Intestinovesical fistula   . Kidney stones    history  . Lung mass   . Multiple myeloma   . Myocardial infarction    '96  . Non Hodgkin's lymphoma ([place])   . Paroxysmal atrial fibrillation ([place]) 01/02/2016  . Peripheral arterial disease ([place])   . Personal history of other diseases of circulatory system   . PONV (postoperative nausea and vomiting)   . Prostate cancer ([place]) 2000  . Shingles   . Shortness of breath   . Sleep apnea    05-02-14 cpap , not yet used- suggested settings 5  . Stroke [facility]) 2013   Speech.    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 2013; H/O cardiac pacemaker, Medtronic REVO, MRI conditional device, placed 07/2011 for sympyomatic bradycardia; Hx of bladder cancer; Hyperlipidemia with target LDL less than 100; Prediabetes; Expressive aphasia; Hemiplegia affecting right dominant side ([place]); Aneurysm of iliac artery ([place]); Shortness of breath; Rotator cuff tear arthropathy of right shoulder; Left rotator cuff tear arthropathy; Muscle  weakness (generalized); Status post arthroscopy of shoulder; Pain in joint, shoulder region; Fever; Pneumonia; Pancytopenia ([place]); Chest pain with moderate risk for cardiac etiology; Inguinal hernia; Obstructive sleep apnea; Central sleep apnea; Hypokalemia; URI (upper respiratory  infection); Weakness; Paroxysmal atrial fibrillation (HCC); COPD (chronic obstructive pulmonary disease) ([place]); and Long term current use of anticoagulant on his problem list.     is allergic to morphine and related and tape.  [name] had no medications administered during this visit.  Past Surgical History:  Procedure Laterality Date  . BLADDER SURGERY    . BONE MARROW TRANSPLANT  2011  . COLON SURGERY     colon resection  . COLONOSCOPY N/A 01/01/2013   Procedure: COLONOSCOPY;  Surgeon: Rogene Houston, MD;  Location[facility];  Service: Endoscopy;  Laterality: N/A;  825-moved to [place] notified pt  . CORONARY ANGIOPLASTY  06/24/2000   PCI and stenting in mid & proximal RCA  . heart stents x 5  1999  . INGUINAL HERNIA REPAIR Right 05/04/2014   Procedure: OPEN RIGHT INGUINAL HERNIA REPAIR with mesh;  Surgeon: Edward Jolly, MD;  Location: [facility] ORS;  Service: General;  Laterality: Right;  . INSERT / REPLACE / REMOVE PACEMAKER    . left ear skin cancer removed    . NM MYOCAR PERF WALL MOTION  11/27/2007   inferior scar  . PACEMAKER INSERTION  07/22/2011   Medtronic  . PORTACATH PLACEMENT  07/26/2009   right chest  . PROSTATE SURGERY    . Rotator    . ROTATOR CUFF REPAIR Right   . SHOULDER ARTHROSCOPY WITH SUBACROMIAL DECOMPRESSION Right 07/21/2013   Procedure: RIGHT SHOULDER ARTHROSCOPY WITH SUBACROMIAL DECOMPRESSION AND DEBRIDEMENT & Injection of Left Shoulder;  Surgeon: Alta Corning, MD;  Location: [facility];  Service: Orthopedics;  Laterality: Right;  . TEE WITHOUT CARDIOVERSION  10/13/2012   Procedure: TRANSESOPHAGEAL ECHOCARDIOGRAM (TEE);  Surgeon: Sanda Klein, MD;  Location: [facility];  Service: Cardiovascular;  Laterality: N/A;  pat/kay/echo notified  . US ECHOCARDIOGRAPHY  06/19/2011   RV mildly dilated,mild to mod. MR,mild AI,mild PI  . WRIST SURGERY     right    Review of Systems  Constitutional: Positive for malaise/fatigue. Negative for fever.       No loss  of appetite   HENT: Negative.   Eyes: Negative.   Respiratory: Positive for shortness of breath.   Cardiovascular: Negative.  Negative for chest pain.  Gastrointestinal: Negative.  Negative for abdominal pain.  Genitourinary: Negative.   Musculoskeletal: Negative.   Skin: Negative.   Neurological: Negative.   Endo/Heme/Allergies: Negative.   Psychiatric/Behavioral: Negative.   All other systems reviewed and are negative. 14 point review of systems was performed and is negative except as detailed under history of present illness and above  PHYSICAL EXAMINATION ECOG PERFORMANCE STATUS: 1 - Symptomatic but completely ambulatory  BP 135/89 (BP Location: Left Arm, Patient Position: Sitting)   Pulse 64   Temp 97.8 F (36.6 C) (Oral)   Resp 18   Wt 193 lb 8 oz (87.8 kg)   SpO2 99%   BMI 30.31 kg/m   Physical Exam  Constitutional: He is oriented to person, place, and time and well-developed, well-nourished, and in no distress.  HENT:  Head: Normocephalic and atraumatic.  Mouth/Throat: Oropharynx is clear and moist.  Eyes: Conjunctivae and EOM are normal. Pupils are equal, round, and reactive to light.  Neck: Normal range of motion. Neck supple.  Cardiovascular: Normal rate, regular rhythm and  normal heart sounds.   Pulmonary/Chest: Effort normal and breath sounds normal.  Abdominal: Soft. Bowel sounds are normal.  Musculoskeletal: Normal range of motion.  Neurological: He is alert and oriented to person, place, and time. Gait normal.  Skin: Skin is warm and dry.  Nursing note and vitals reviewed.  LABORATORY DATA: I have reviewed the data as listed  CBC Latest Ref Rng & Units 11/14/2016 09/19/2016 07/25/2016  WBC 4.0 - 10.5 K/uL 4.7 3.6(L) 3.7(L)  Hemoglobin 13.0 - 17.0 g/dL 12.5(L) 12.0(L) 12.1(L)  Hematocrit 39.0 - 52.0 % 38.4(L) 37.5(L) 37.2(L)  Platelets 150 - 400 K/uL 137(L) 105(L) 126(L)   CMP Latest Ref Rng & Units 11/14/2016 11/14/2016 09/19/2016  Glucose 65 - 99 mg/dL -  106(H) 84  BUN 6 - 20 mg/dL - 11 11  Creatinine 0.61 - 1.24 mg/dL - 0.99 0.85  Sodium 135 - 145 mmol/L - 138 137  Potassium 3.5 - 5.1 mmol/L - 4.1 3.8  Chloride 101 - 111 mmol/L - 106 105  CO2 22 - 32 mmol/L - 28 26  Calcium 8.9 - 10.3 mg/dL - 9.1 9.0  Total Protein 6.5 - 8.1 g/dL 7.3 7.1 6.7  Total Bilirubin 0.3 - 1.2 mg/dL 0.4 0.4 0.5  Alkaline Phos 38 - 126 U/L 72 69 65  AST 15 - 41 U/L _0 ALT 17 - 63 U/L _1 ASSESSMENT AND PLAN:  IgG lambda myeloma on maintenance revlimid since 2011. 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 He tolerates his Revlimid without difficulty ; he is clearly beyond the typical 2 year maintenance, but last seen in 08/2016 at [facility] and advised to continue. He is on IVIG for hypogammaglobulinemia . He tolerates it very well and has had no recent problems with infection. We will continue with monthly therapy.  He will return in one month with labs and ongoing follow-up. No evidence of recurrent lymphoma.    Clinically stable. No evidence of recurrence of his Mm. Continue maintenance revlimid.  Patient was asking about taking B12 for his energy levels. I will add B12 to his labs from this morning. I will call him with these results if he has a vitamin B12 deficiency.   Continue IVIG for hypogammaglobulinemia.   He will return for follow up in 4 weeks with CBC, CMP, free light chains, SPEP..   All questions were answered. The patient knows to call the clinic with any problems, questions or concerns. We can certainly see the patient much sooner if necessary.  This document serves as a record of services personally performed by Twana First, MD. It was  created on her behalf by Martinique Casey, a trained medical scribe. The creation of this record is based on the scribe's personal observations and the provider's statements to them. This document has been checked and approved by the attending provider.  I have reviewed the above documentation for accuracy and completeness, and I agree with the above.  Martinique M Casey  01/13/2017 9:25 AM

## 2017-01-14 ENCOUNTER — Other Ambulatory Visit (HOSPITAL_COMMUNITY): Payer: Self-pay | Admitting: Oncology

## 2017-01-14 DIAGNOSIS — C61 Malignant neoplasm of prostate: Secondary | ICD-10-CM | POA: Diagnosis not present

## 2017-01-14 LAB — KAPPA/LAMBDA LIGHT CHAINS
KAPPA FREE LGHT CHN: 37.8 mg/L — AB (ref 3.3–19.4)
KAPPA, LAMDA LIGHT CHAIN RATIO: 1.43 (ref 0.26–1.65)
LAMDA FREE LIGHT CHAINS: 26.5 mg/L — AB (ref 5.7–26.3)

## 2017-01-14 LAB — IMMUNOFIXATION ELECTROPHORESIS
IGA: 371 mg/dL (ref 61–437)
IGM, SERUM: 29 mg/dL (ref 15–143)
IgG (Immunoglobin G), Serum: 941 mg/dL (ref 700–1600)
Total Protein ELP: 6.2 g/dL (ref 6.0–8.5)

## 2017-01-14 LAB — PROTEIN ELECTROPHORESIS, SERUM
A/G Ratio: 1 (ref 0.7–1.7)
ALPHA-2-GLOBULIN: 0.8 g/dL (ref 0.4–1.0)
Albumin ELP: 3.2 g/dL (ref 2.9–4.4)
Alpha-1-Globulin: 0.3 g/dL (ref 0.0–0.4)
Beta Globulin: 1 g/dL (ref 0.7–1.3)
GLOBULIN, TOTAL: 3.2 g/dL (ref 2.2–3.9)
Gamma Globulin: 1.1 g/dL (ref 0.4–1.8)
Total Protein ELP: 6.4 g/dL (ref 6.0–8.5)

## 2017-01-14 LAB — IGG, IGA, IGM
IGM, SERUM: 32 mg/dL (ref 15–143)
IgA: 373 mg/dL (ref 61–437)
IgG (Immunoglobin G), Serum: 942 mg/dL (ref 700–1600)

## 2017-01-14 NOTE — Progress Notes (Signed)
Please let patient know that his vitamin B12 level is low and i've set him up for monthly vitamin B12 injections.

## 2017-01-20 ENCOUNTER — Encounter (HOSPITAL_BASED_OUTPATIENT_CLINIC_OR_DEPARTMENT_OTHER): Payer: Medicare Other

## 2017-01-20 ENCOUNTER — Encounter (HOSPITAL_COMMUNITY): Payer: Self-pay

## 2017-01-20 VITALS — BP 146/73 | HR 64 | Temp 97.9°F | Resp 20

## 2017-01-20 DIAGNOSIS — D801 Nonfamilial hypogammaglobulinemia: Secondary | ICD-10-CM

## 2017-01-20 DIAGNOSIS — C9001 Multiple myeloma in remission: Secondary | ICD-10-CM | POA: Diagnosis present

## 2017-01-20 MED ORDER — CYANOCOBALAMIN 1000 MCG/ML IJ SOLN
INTRAMUSCULAR | Status: AC
  Filled 2017-01-20: qty 1

## 2017-01-20 MED ORDER — CYANOCOBALAMIN 1000 MCG/ML IJ SOLN
1000.0000 ug | INTRAMUSCULAR | Status: DC
  Administered 2017-01-20: 1000 ug via INTRAMUSCULAR

## 2017-01-20 MED ORDER — HYDROCODONE-ACETAMINOPHEN 10-325 MG PO TABS: 1.0000 | ORAL_TABLET | ORAL | 0 refills | Status: DC | PRN

## 2017-01-20 NOTE — Progress Notes (Signed)
[facility] presents today for injection per MD orders. B12 1000 mcg administered IM in left deltoid. Administration without incident. Patient tolerated well. Patient discharged ambulatory from clinic.

## 2017-01-20 NOTE — Patient Instructions (Signed)
[place] at [facility] Discharge Instructions  RECOMMENDATIONS MADE BY THE CONSULTANT AND ANY TEST RESULTS WILL BE SENT TO YOUR REFERRING PHYSICIAN.  You got your B-12 injection today, you will get it monthly Follow up as previously scheduled  Thank you for choosing [place] at [facility] to provide your oncology and hematology care.  To afford each patient quality time with our provider, please arrive at least 15 minutes before your scheduled appointment time.    If you have a lab appointment with the [place] please come in thru the  Main Entrance and check in at the main information desk  You need to re-schedule your appointment should you arrive 10 or more minutes late.  We strive to give you quality time with our providers, and arriving late affects you and other patients whose appointments are after yours.  Also, if you no show three or more times for appointments you may be dismissed from the clinic at the providers discretion.     Again, thank you for choosing [facility].  Our hope is that these requests will decrease the amount of time that you wait before being seen by our physicians.       _____________________________________________________________  Should you have questions after your visit to [facility], please contact our office at [phone] between the hours of 8:30 a.m. and 4:30 p.m.  Voicemails left after 4:30 p.m. will not be returned until the following business day.  For prescription refill requests, have your pharmacy contact our office.       Resources For Cancer Patients and their Caregivers ? American Cancer Society: Can assist with transportation, wigs, general needs, runs Look Good Feel Better.        401-627-4561 ? Cancer Care: Provides financial assistance, online support groups, medication/co-pay assistance.  1-800-813-HOPE (775)638-9849) ? [place] Assists [place] cancer patients and their families through emotional , educational and financial support.  450-812-5844 ? [place] DSS Where to apply for food stamps, Medicaid and utility assistance. 947-180-4519 ? RCATS: Transportation to medical appointments. 269-264-9862 ? Social Security Administration: May apply for disability if have a Stage IV cancer. 6157299364 501-310-9578 ? [employer], Disability and Transit Services: Assists with nutrition, care and transit needs. [place] Support Programs: @10RELATIVEDAYS @ > Cancer Support Group  2nd Tuesday of the month 1pm-2pm, Journey Room  > Creative Journey  3rd Tuesday of the month 1130am-1pm, Journey Room  > Look Good Feel Better  1st Wednesday of the month 10am-12 noon, Journey Room (Call [place] to register [phone])

## 2017-01-21 ENCOUNTER — Other Ambulatory Visit (HOSPITAL_COMMUNITY): Payer: Self-pay | Admitting: Oncology

## 2017-01-21 ENCOUNTER — Ambulatory Visit (HOSPITAL_COMMUNITY): Payer: Medicare Other

## 2017-01-21 DIAGNOSIS — C9001 Multiple myeloma in remission: Secondary | ICD-10-CM

## 2017-01-21 MED ORDER — LENALIDOMIDE 10 MG PO CAPS: ORAL_CAPSULE | ORAL | 0 refills | Status: DC

## 2017-01-23 DIAGNOSIS — L821 Other seborrheic keratosis: Secondary | ICD-10-CM | POA: Diagnosis not present

## 2017-02-10 ENCOUNTER — Encounter (HOSPITAL_COMMUNITY): Payer: Medicare Other

## 2017-02-10 ENCOUNTER — Encounter (HOSPITAL_COMMUNITY): Payer: Self-pay | Admitting: Oncology

## 2017-02-10 ENCOUNTER — Encounter (HOSPITAL_COMMUNITY): Payer: Medicare Other | Attending: Oncology | Admitting: Oncology

## 2017-02-10 ENCOUNTER — Encounter (HOSPITAL_BASED_OUTPATIENT_CLINIC_OR_DEPARTMENT_OTHER): Payer: Medicare Other

## 2017-02-10 VITALS — BP 121/66 | HR 61 | Temp 97.8°F | Resp 16 | Wt 192.4 lb

## 2017-02-10 DIAGNOSIS — D801 Nonfamilial hypogammaglobulinemia: Secondary | ICD-10-CM | POA: Diagnosis not present

## 2017-02-10 DIAGNOSIS — C9000 Multiple myeloma not having achieved remission: Secondary | ICD-10-CM | POA: Insufficient documentation

## 2017-02-10 DIAGNOSIS — C9001 Multiple myeloma in remission: Secondary | ICD-10-CM | POA: Insufficient documentation

## 2017-02-10 DIAGNOSIS — Z8572 Personal history of non-Hodgkin lymphomas: Secondary | ICD-10-CM

## 2017-02-10 LAB — CBC WITH DIFFERENTIAL/PLATELET
BASOS ABS: 0.1 10*3/uL (ref 0.0–0.1)
BASOS PCT: 1 %
EOS ABS: 0.2 10*3/uL (ref 0.0–0.7)
Eosinophils Relative: 4 %
HCT: 37.4 % — ABNORMAL LOW (ref 39.0–52.0)
HEMOGLOBIN: 12.4 g/dL — AB (ref 13.0–17.0)
Lymphocytes Relative: 34 %
Lymphs Abs: 1.4 10*3/uL (ref 0.7–4.0)
MCH: 29.2 pg (ref 26.0–34.0)
MCHC: 33.2 g/dL (ref 30.0–36.0)
MCV: 88.2 fL (ref 78.0–100.0)
Monocytes Absolute: 0.5 10*3/uL (ref 0.1–1.0)
Monocytes Relative: 12 %
NEUTROS ABS: 2 10*3/uL (ref 1.7–7.7)
NEUTROS PCT: 49 %
Platelets: 112 10*3/uL — ABNORMAL LOW (ref 150–400)
RBC: 4.24 MIL/uL (ref 4.22–5.81)
RDW: 16.1 % — ABNORMAL HIGH (ref 11.5–15.5)
WBC: 4.2 10*3/uL (ref 4.0–10.5)

## 2017-02-10 LAB — COMPREHENSIVE METABOLIC PANEL
ALBUMIN: 3.6 g/dL (ref 3.5–5.0)
ALK PHOS: 73 U/L (ref 38–126)
ALT: 15 U/L — ABNORMAL LOW (ref 17–63)
ANION GAP: 7 (ref 5–15)
AST: 26 U/L (ref 15–41)
BUN: 11 mg/dL (ref 6–20)
CALCIUM: 9.2 mg/dL (ref 8.9–10.3)
CO2: 24 mmol/L (ref 22–32)
Chloride: 106 mmol/L (ref 101–111)
Creatinine, Ser: 1.01 mg/dL (ref 0.61–1.24)
GFR calc Af Amer: >60 mL/min (ref >60)
GFR calc non Af Amer: >60 mL/min (ref >60)
Glucose, Bld: 140 mg/dL — ABNORMAL HIGH (ref 65–99)
Potassium: 4 mmol/L (ref 3.5–5.1)
SODIUM: 137 mmol/L (ref 135–145)
Total Bilirubin: 0.8 mg/dL (ref 0.3–1.2)
Total Protein: 7 g/dL (ref 6.5–8.1)

## 2017-02-10 MED ORDER — SODIUM CHLORIDE 0.9% FLUSH
10.0000 mL | INTRAVENOUS | Status: DC | PRN
  Administered 2017-02-10: 10 mL via INTRAVENOUS
  Filled 2017-02-10: qty 10

## 2017-02-10 MED ORDER — IMMUNE GLOBULIN (HUMAN) 10 GM/100ML IV SOLN
400.0000 mg/kg | Freq: Once | INTRAVENOUS | Status: AC
  Administered 2017-02-10: 35 g via INTRAVENOUS
  Filled 2017-02-10: qty 200

## 2017-02-10 MED ORDER — ACETAMINOPHEN 325 MG PO TABS: 650.0000 mg | ORAL_TABLET | Freq: Once | ORAL | Status: DC

## 2017-02-10 MED ORDER — HEPARIN SOD (PORK) LOCK FLUSH 100 UNIT/ML IV SOLN
500.0000 [IU] | Freq: Once | INTRAVENOUS | Status: AC
  Administered 2017-02-10: 500 [IU] via INTRAVENOUS

## 2017-02-10 MED ORDER — HEPARIN SOD (PORK) LOCK FLUSH 100 UNIT/ML IV SOLN
INTRAVENOUS | Status: AC
  Filled 2017-02-10: qty 5

## 2017-02-10 MED ORDER — DEXTROSE 5 % IV SOLN
INTRAVENOUS | Status: DC
  Administered 2017-02-10: 10:00:00 via INTRAVENOUS

## 2017-02-10 NOTE — Patient Instructions (Signed)
[place] at [facility] Discharge Instructions  RECOMMENDATIONS MADE BY THE CONSULTANT AND ANY TEST RESULTS WILL BE SENT TO YOUR REFERRING PHYSICIAN.  You were seen today by Kirby Crigler PA-C. Continue daily Revlimid (7 days on 7 days off). Continue Eliquis. IVIG in 4 weeks. Return in 8 weeks for labs, follow up and IVIG.   Thank you for choosing [place] at [facility] to provide your oncology and hematology care.  To afford each patient quality time with our provider, please arrive at least 15 minutes before your scheduled appointment time.    If you have a lab appointment with the [place] please come in thru the  Main Entrance and check in at the main information desk  You need to re-schedule your appointment should you arrive 10 or more minutes late.  We strive to give you quality time with our providers, and arriving late affects you and other patients whose appointments are after yours.  Also, if you no show three or more times for appointments you may be dismissed from the clinic at the providers discretion.     Again, thank you for choosing [facility].  Our hope is that these requests will decrease the amount of time that you wait before being seen by our physicians.       _____________________________________________________________  Should you have questions after your visit to [facility], please contact our office at [phone] between the hours of 8:30 a.m. and 4:30 p.m.  Voicemails left after 4:30 p.m. will not be returned until the following business day.  For prescription refill requests, have your pharmacy contact our office.       Resources For Cancer Patients and their Caregivers ? American Cancer Society: Can assist with transportation, wigs, general needs, runs Look Good Feel Better.        ([phone] ? Cancer Care: Provides financial assistance, online support groups,  medication/co-pay assistance.  1-800-813-HOPE 775-715-2439) ? [place] Assists [place] cancer patients and their families through emotional , educational and financial support.  740-445-5232 ? [place] DSS Where to apply for food stamps, Medicaid and utility assistance. 701-072-4869 ? RCATS: Transportation to medical appointments. [phone] ? Social Security Administration: May apply for disability if have a Stage IV cancer. (743)603-8796 (336)216-2790 ? [employer], Disability and Transit Services: Assists with nutrition, care and transit needs. [place] Support Programs: @10RELATIVEDAYS @ > Cancer Support Group  2nd Tuesday of the month 1pm-2pm, Journey Room  > Creative Journey  3rd Tuesday of the month 1130am-1pm, Journey Room  > Look Good Feel Better  1st Wednesday of the month 10am-12 noon, Journey Room (Call [place] to register [phone])

## 2017-02-10 NOTE — Assessment & Plan Note (Signed)
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 He has been on maintenance Revlimid 10 mg for 7 days on and 7 days off without any intolerances to date.  He is past the typical 2 years worth of maintenance therapy, but he was seen at [facility] in 2011 and advised to continue Revlimid.  He is also followed annually at [facility] for survivorship, last being seen in 08/2016.    Labs today: CBC diff, CMET, SPEP+IFE, light chain assay.  I personally reviewed and went over laboratory results with the patient.  The results are noted within this dictation.  He will continue maintenance Revlimid. He was on ASA daily, but this was discontinued by his cardiologist. He is now on Eliquis for paroxysmal atrial fibrillation. He is continued on Plavix as well.    Labs in 8 weeks: CBC diff, CMET, SPEP + IFE, and light chain assay.  Bone density in Jan 2018 was negative for any osseous involvement of multiple myeloma.  He will be due for his next skeletal survey in jan 2019.  Return in 2 months for follow-up. 

## 2017-02-10 NOTE — Patient Instructions (Signed)
[facility] Discharge Instructions  RECOMMENDATIONS MADE BY THE CONSULTANT AND ANY TEST RESULTS WILL BE SENT TO YOUR REFERRING PHYSICIAN.  Received IVIG infusion today. Follow-up as scheduled. Call clinic for any questions or concerns  Thank you for choosing [facility] to provide your oncology and hematology care.  To afford each patient quality time with our provider, please arrive at least 15 minutes before your scheduled appointment time.    If you have a lab appointment with the Cancer Center please come in thru the  Main Entrance and check in at the main information desk  You need to re-schedule your appointment should you arrive 10 or more minutes late.  We strive to give you quality time with our providers, and arriving late affects you and other patients whose appointments are after yours.  Also, if you no show three or more times for appointments you may be dismissed from the clinic at the providers discretion.     Again, thank you for choosing [facility].  Our hope is that these requests will decrease the amount of time that you wait before being seen by our physicians.       _____________________________________________________________  Should you have questions after your visit to [facility], please contact our office at [phone] between the hours of 8:30 a.m. and 4:30 p.m.  Voicemails left after 4:30 p.m. will not be returned until the following business day.  For prescription refill requests, have your pharmacy contact our office.       Resources For Cancer Patients and their Caregivers ? American Cancer Society: Can assist with transportation, wigs, general needs, runs Look Good Feel Better.        1-888-227-6333 ? Cancer Care: Provides financial assistance, online support groups, medication/co-pay assistance.  1-800-813-HOPE (4673) ? [facility] Assists [place] cancer patients and their families through emotional , educational and financial support.  336-427-4357 ? [place] DSS Where to apply for food stamps, Medicaid and utility assistance. 336-342-1394 ? RCATS: Transportation to medical appointments. 336-347-2287 ? Social Security Administration: May apply for disability if have a Stage IV cancer. 336-342-7796 1-800-772-1213 ? [place] Aging, Disability and Transit Services: Assists with nutrition, care and transit needs. 336-349-2343  Cancer Center Support Programs: @10RELATIVEDAYS@ > Cancer Support Group  2nd Tuesday of the month 1pm-2pm, Journey Room  > Creative Journey  3rd Tuesday of the month 1130am-1pm, Journey Room  > Look Good Feel Better  1st Wednesday of the month 10am-12 noon, Journey Room (Call American Cancer Society to register 1-800-395-5775)   

## 2017-02-10 NOTE — Progress Notes (Signed)
Jeremy Hillier, MD [place] [address]  Multiple myeloma in remission [facility])  Hx of lymphoma  Hypogammaglobulinemia ([place])  CURRENT THERAPY: Revlimid 10 mg daily for 7 days on and 7 days off and monthly low-dose IVIG, on Eliquis for paroxysmal atrial fibrillation (managed by cardiology).  INTERVAL HISTORY: Jeremy Parrish 76 y.o. 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 Marcell Anger. Initially treated with Vd x 4 cycles followed by BMT.  S/P bisphosphonate therapy with last treatment on 08/18/2014.  Annual survivorship follow-up at [facility], last seen in 08/2016. 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  AND Hypogammaglobulinemia with history of frequent and recurrent infections requiring antibiotics and high dose IVIG until monthly low-dose IVIG was instituted with an excellent response with minimal antibiotic requirements.  From a hematology standpoint, patient doing well.  He is tolerating his Revlimid.  He reports compliance.  His laboratory counts today satisfy treatment parameters moving forward.  He denies any nausea or vomiting.  From a hypogammaglobulinemia standpoint, he is doing well.  He denies any cough or sputum production.  Denies any signs or symptoms of an upper respiratory tract infection.  Denies any fevers or chills.  His appetite is good.  Weight is stable.  He is expecting his first great grandchild in the near future.  He had a wonderful Easter.  Review of Systems  Constitutional: Negative.  Negative for chills, fever and weight loss.  HENT: Negative.   Eyes: Negative.   Respiratory: Negative.  Negative for  cough.   Cardiovascular: Negative.  Negative for chest pain.  Gastrointestinal: Negative.  Negative for blood in stool, constipation, diarrhea, melena, nausea and vomiting.  Genitourinary: Negative.   Musculoskeletal: Negative.   Skin: Negative.   Neurological: Negative.  Negative for weakness.  Endo/Heme/Allergies: Negative.   Psychiatric/Behavioral: Negative.     Past Medical History:  Diagnosis Date  . Anemia   . Aortic aneurysm of unspecified site without mention of rupture   . Arthritis   . Bladder neck contracture   . Cancer ([place])   . Cerebral atherosclerosis    Carotid Doppler, 02/16/2013 - Bilateral Proximal ICAs,demonstrate mild plaque w/o evidence of significant diameter reduction, dissection, or any other vascular abnormality  . CHF (congestive heart failure) ([place])   . Complication of anesthesia   . COPD (chronic obstructive pulmonary disease) ([place])   . Coronary artery disease   . Depression   . Esophageal reflux   . Heart disease   . Heart murmur   . Hx of bladder cancer 10/07/2012  . Hyperlipidemia   . Hypertension   . Hypogammaglobulinemia ([place]) 09/28/2012   Secondary to Lymphoma and Multiple Myeloma and their treatments  . Intestinovesical fistula   . Kidney stones    history  . Lung mass   . Multiple myeloma   . Myocardial infarction    '96  . Non Hodgkin's lymphoma ([place])   . Paroxysmal atrial fibrillation ([place]) 01/02/2016  . Peripheral arterial disease ([place])   . Personal history of other diseases of circulatory system   . PONV (postoperative nausea and vomiting)   . Prostate cancer (  [place]) 2000  . Shingles   . Shortness of breath   . Sleep apnea    05-02-14 cpap , not yet used- suggested settings 5  . Stroke [facility]) 2013   Speech.    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; H/O cardiac pacemaker, Medtronic REVO, MRI conditional device, placed 07/2011 for sympyomatic bradycardia; Hx of bladder cancer; Hyperlipidemia with target LDL less than 100; Prediabetes; Expressive aphasia; Hemiplegia affecting right dominant side ([place]); Aneurysm of iliac artery ([place]); Shortness of breath; Rotator cuff tear arthropathy of right shoulder; Left rotator cuff tear arthropathy; Muscle weakness (generalized); Status post arthroscopy of shoulder; Pain in joint, shoulder region; Fever; Pneumonia; Pancytopenia ([place]); Chest pain with moderate risk for cardiac etiology; Inguinal hernia; Obstructive sleep apnea; Central sleep apnea; Hypokalemia; URI (upper respiratory infection); Weakness; Paroxysmal atrial fibrillation (HCC); COPD (chronic obstructive pulmonary disease) ([place]); and Long term current use of anticoagulant on his problem list.     is allergic to morphine and related and tape.  Current Outpatient Prescriptions on File Prior to Visit  Medication Sig Dispense Refill  . albuterol (PROVENTIL HFA;VENTOLIN HFA) 108 (90 Base) MCG/ACT inhaler Inhale 2 puffs into the lungs every 6 (six) hours as needed for wheezing or shortness of breath. 1 Inhaler 3  . albuterol (PROVENTIL) (2.5 MG/3ML) 0.083% nebulizer solution Use via neb q 4 hours prn wheezing (Patient taking differently: Take 2.5 mg by nebulization every 4 (four) hours as needed for wheezing or shortness of breath. ) 75 vial 2  . ALPRAZolam (XANAX) 0.5 MG tablet Take 1-2 tablets (0.5-1 mg total) by mouth at bedtime as needed for anxiety. 90 tablet 1  . amLODipine (NORVASC) 10 MG tablet TAKE 1 TABLET (10 MG TOTAL) BY MOUTH EVERY MORNING. 90 tablet 1  . apixaban (ELIQUIS) 5 MG TABS tablet Take 1 tablet (5 mg total) by mouth 2 (two) times daily. 180  tablet 01  . budesonide (PULMICORT) 0.5 MG/2ML nebulizer solution Take 2 mLs (0.5 mg total) by nebulization 2 (two) times daily. (Patient taking differently: Take 0.5 mg by nebulization daily as needed (for shortness of breath). ) 120 mL 2  . chlorzoxazone (PARAFON) 500 MG tablet Take 1 tablet (500 mg total) by mouth 3 (three) times daily as needed for muscle spasms. 60 tablet 2  . citalopram (CELEXA) 40 MG tablet TAKE 1 TABLET (40 MG TOTAL) BY MOUTH DAILY. 90 tablet 1  . clopidogrel (PLAVIX) 75 MG tablet TAKE 1 TABLET BY MOUTH EVERY DAY 90 tablet 3  . furosemide (LASIX) 20 MG tablet USE SPARINGLY AS NEEDED FOR EDEMA NO MORE THAN ONE PER WEEK 21 tablet 0  . griseofulvin (GRIFULVIN V) 500 MG tablet Take 1 tablet (500 mg total) by mouth daily. 14 tablet 0  . HYDROcodone-acetaminophen (NORCO) 10-325 MG tablet Take 1 tablet by mouth every 4 (four) hours as needed. 60 tablet 0  . HYDROcodone-homatropine (HYCODAN) 5-1.5 MG/5ML syrup Take 5 mLs by mouth at bedtime as needed for cough. 90 mL 0  . ketoconazole (NIZORAL) 2 % cream APPLY 1 APPLICATION TOPICALLY 2 (TWO) TIMES DAILY. 30 g 2  . KLOR-CON M20 20 MEQ tablet Take 1 tablet (20 mEq total) by mouth 3 (three) times daily. Reported on 11/15/2015 270 tablet 2  .  lenalidomide (REVLIMID) 10 MG capsule TAKE 1 CAPSULE BY MOUTH DAILY FOR 7 DAYS ON FOLLOWED BY 7 DAYS OFF. THEN REPEAT CYCLE. 14 capsule 0  . levocetirizine (XYZAL) 5 MG tablet Take 5 mg by mouth daily as needed for allergies.     Marland Kitchen LYRICA 75 MG capsule     . nitroGLYCERIN (NITROSTAT) 0.4 MG SL tablet Place 1 tablet (0.4 mg total) under the tongue every 5 (five) minutes as needed for chest pain. 25 tablet 0  . omeprazole (PRILOSEC) 20 MG capsule TAKE 1 CAPSULE (20 MG TOTAL) BY MOUTH DAILY. 90 capsule 1  . potassium chloride SA (K-DUR,KLOR-CON) 20 MEQ tablet Take 1 tablet (20 mEq total) by mouth 3 (three) times daily. 270 tablet 4  . simvastatin (ZOCOR) 20 MG tablet TAKE 1 TABLET (20 MG TOTAL) BY MOUTH  AT BEDTIME. 90 tablet 1   Current Facility-Administered Medications on File Prior to Visit  Medication Dose Route Frequency Provider Last Rate Last Dose  . acetaminophen (TYLENOL) tablet 650 mg  650 mg Oral Q6H PRN Baird Cancer, PA-C   650 mg at 12/09/14 0855  . sodium chloride 0.9 % injection 10 mL  10 mL Intracatheter PRN Baird Cancer, PA-C   10 mL at 12/09/14 2353    Past Surgical History:  Procedure Laterality Date  . BLADDER SURGERY    . BONE MARROW TRANSPLANT  2011  . COLON SURGERY     colon resection  . COLONOSCOPY N/A 01/01/2013   Procedure: COLONOSCOPY;  Surgeon: Rogene Houston, MD;  Location: [facility];  Service: Endoscopy;  Laterality: N/A;  825-moved to [place] notified pt  . CORONARY ANGIOPLASTY  06/24/2000   PCI and stenting in mid & proximal RCA  . heart stents x 5  1999  . INGUINAL HERNIA REPAIR Right 05/04/2014   Procedure: OPEN RIGHT INGUINAL HERNIA REPAIR with mesh;  Surgeon: Edward Jolly, MD;  Location: [facility] ORS;  Service: General;  Laterality: Right;  . INSERT / REPLACE / REMOVE PACEMAKER    . left ear skin cancer removed    . NM MYOCAR PERF WALL MOTION  11/27/2007   inferior scar  . PACEMAKER INSERTION  07/22/2011   Medtronic  . PORTACATH PLACEMENT  07/26/2009   right chest  . PROSTATE SURGERY    . Rotator    . ROTATOR CUFF REPAIR Right   . SHOULDER ARTHROSCOPY WITH SUBACROMIAL DECOMPRESSION Right 07/21/2013   Procedure: RIGHT SHOULDER ARTHROSCOPY WITH SUBACROMIAL DECOMPRESSION AND DEBRIDEMENT & Injection of Left Shoulder;  Surgeon: Alta Corning, MD;  Location: [place];  Service: Orthopedics;  Laterality: Right;  . TEE WITHOUT CARDIOVERSION  10/13/2012   Procedure: TRANSESOPHAGEAL ECHOCARDIOGRAM (TEE);  Surgeon: Sanda Klein, MD;  Location: [facility] ENDOSCOPY;  Service: Cardiovascular;  Laterality: N/A;  pat/kay/echo notified  . US ECHOCARDIOGRAPHY  06/19/2011   RV mildly dilated,mild to mod. MR,mild AI,mild PI  . WRIST SURGERY     right    PHYSICAL  EXAMINATION  ECOG PERFORMANCE STATUS: 1 - Symptomatic but completely ambulatory  Vitals:   02/10/17 0930  BP: (!) 141/61  Pulse: 66  Resp: 20  Temp: 98.2 F (36.8 C)     GENERAL:alert, no distress, well nourished, well developed, comfortable, cooperative, smiling, unaccompanied, in chemo-recliner. SKIN: skin color, texture, turgor are normal, no rashes or significant lesions HEAD: Normocephalic, No masses, lesions, tenderness or abnormalities EYES: normal, PERRLA, EOMI, Conjunctiva are pink and non-injected.  EARS: External ears normal OROPHARYNX:lips, buccal mucosa, and tongue  normal and mucous membranes are moist  NECK: supple, thyroid normal size, non-tender, without nodularity, trachea midline LYMPH:  no palpable lymphadenopathy BREAST:not examined LUNGS: Clear to auscultation bilaterally without wheezes, rales, or rhonchi. HEART: regular rate & rhythm, no murmurs, no gallops, S1 normal and S2 normal ABDOMEN:abdomen soft, non-tender and normal bowel sounds BACK: Back symmetric, no curvature., No CVA tenderness EXTREMITIES:less then 2 second capillary refill, no joint deformities, effusion, or inflammation, no skin discoloration, no cyanosis  NEURO: alert & oriented x 3 with expressive aphasia (Broca's aphasia), no focal motor/sensory deficits, gait normal   LABORATORY DATA: CBC    Component Value Date/Time   WBC 4.2 02/10/2017 0833   RBC 4.24 02/10/2017 0833   HGB 12.4 (L) 02/10/2017 0833   HCT 37.4 (L) 02/10/2017 0833   PLT 112 (L) 02/10/2017 0833   MCV 88.2 02/10/2017 0833   MCH 29.2 02/10/2017 0833   MCHC 33.2 02/10/2017 0833   RDW 16.1 (H) 02/10/2017 0833   LYMPHSABS 1.4 02/10/2017 0833   MONOABS 0.5 02/10/2017 0833   EOSABS 0.2 02/10/2017 0833   BASOSABS 0.1 02/10/2017 0833      Chemistry      Component Value Date/Time   NA 137 02/10/2017 0833   K 4.0 02/10/2017 0833   CL 106 02/10/2017 0833   CO2 24 02/10/2017 0833   BUN 11 02/10/2017 0833    CREATININE 1.01 02/10/2017 0833   CREATININE 0.80 11/07/2014 1034      Component Value Date/Time   CALCIUM 9.2 02/10/2017 0833   ALKPHOS 73 02/10/2017 0833   AST 26 02/10/2017 0833   ALT 15 (L) 02/10/2017 0833   BILITOT 0.8 02/10/2017 0833      Lab Results  Component Value Date   PROT 7.0 02/10/2017   ALBUMINELP 3.2 01/13/2017   A1GS 0.3 01/13/2017   A2GS 0.8 01/13/2017   BETS 1.0 01/13/2017   BETA2SER 5.5 01/06/2015   GAMS 1.1 01/13/2017   MSPIKE Not Observed 01/13/2017   SPEI Comment 01/13/2017   SPECOM Comment 01/13/2017   IGGSERUM 942 01/13/2017   IGGSERUM 941 01/13/2017   IGA 373 01/13/2017   IGA 371 01/13/2017   IGMSERUM 32 01/13/2017   IGMSERUM 29 01/13/2017   IMMELINT (NOTE) 01/06/2015   KPAFRELGTCHN 37.8 (H) 01/13/2017   LAMBDASER 26.5 (H) 01/13/2017   KAPLAMBRATIO 1.43 01/13/2017    PENDING LABS:   RADIOGRAPHIC STUDIES:  No results found.   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 He has been on maintenance Revlimid 10 mg for 7 days on and 7 days off without any intolerances to date.  He is past the typical 2 years worth of maintenance therapy, but he was seen at [facility] in 2011 and advised to continue Revlimid.  He is also followed annually at [facility] for survivorship, last being seen in 08/2016.    Labs today: CBC diff, CMET, SPEP+IFE, light chain assay.  I personally reviewed and went over laboratory results with the patient.  The results are noted within this dictation.  He will continue maintenance Revlimid. He was on ASA daily, but this was discontinued by his cardiologist. He is now on Eliquis for paroxysmal atrial fibrillation. He is continued on Plavix as well.    Labs in 8 weeks: CBC diff, CMET, SPEP + IFE, and light chain assay.  Bone density in Jan 2018 was negative for any osseous involvement of multiple myeloma.  He will be due for his  next skeletal survey in jan 2019.  Return in 2 months for follow-up.  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  NED.  Hypogammaglobulinemia Hypogammaglobulinemia, receiving IVIG every 4 weeks without significant decreased in documented infections.  Tolerating well.   IVIG today and repeated every 4 weeks.  Supportive therapy plan reviewed.   THERAPY PLAN:  Continue with current treatment.  Continue monthly IVIG.  Continue Revlimid 7 days on and 7 days off.  We will monitor WBC.    All questions were answered. The patient knows to call the clinic with any problems, questions or concerns. We can certainly see the patient much sooner if necessary.  Patient and plan discussed with Dr. Twana First and she is in agreement with the aforementioned.   This note is electronically signed by: Doy Mince 02/10/2017 9:39 AM

## 2017-02-10 NOTE — Progress Notes (Signed)
Jeremy Parrish tolerated IVIG infusion well without complaints or incident. Pt also taking his Revlimid as prescribed without any issues.VSS upon discharge. Pt discharged self ambulatory in satisfactory condition

## 2017-02-10 NOTE — Assessment & Plan Note (Signed)
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  NED.

## 2017-02-10 NOTE — Assessment & Plan Note (Signed)
Hypogammaglobulinemia, receiving IVIG every 4 weeks without significant decreased in documented infections.  Tolerating well.   IVIG today and repeated every 4 weeks.  Supportive therapy plan reviewed.

## 2017-02-11 DIAGNOSIS — M25511 Pain in right shoulder: Secondary | ICD-10-CM | POA: Diagnosis not present

## 2017-02-11 DIAGNOSIS — M25512 Pain in left shoulder: Secondary | ICD-10-CM | POA: Diagnosis not present

## 2017-02-11 LAB — KAPPA/LAMBDA LIGHT CHAINS
KAPPA FREE LGHT CHN: 38.1 mg/L — AB (ref 3.3–19.4)
Kappa, lambda light chain ratio: 1.47 (ref 0.26–1.65)
Lambda free light chains: 26 mg/L (ref 5.7–26.3)

## 2017-02-11 LAB — IGG, IGA, IGM
IGG (IMMUNOGLOBIN G), SERUM: 920 mg/dL (ref 700–1600)
IgA: 363 mg/dL (ref 61–437)
IgM, Serum: 26 mg/dL (ref 15–143)

## 2017-02-11 MED ORDER — DARBEPOETIN ALFA 60 MCG/0.3ML IJ SOSY
PREFILLED_SYRINGE | INTRAMUSCULAR | Status: AC
  Filled 2017-02-11: qty 0.3

## 2017-02-12 LAB — PROTEIN ELECTROPHORESIS, SERUM
A/G Ratio: 1.3 (ref 0.7–1.7)
ALBUMIN ELP: 3.7 g/dL (ref 2.9–4.4)
ALPHA-1-GLOBULIN: 0.2 g/dL (ref 0.0–0.4)
Alpha-2-Globulin: 0.8 g/dL (ref 0.4–1.0)
Beta Globulin: 0.8 g/dL (ref 0.7–1.3)
GLOBULIN, TOTAL: 2.8 g/dL (ref 2.2–3.9)
Gamma Globulin: 1 g/dL (ref 0.4–1.8)
TOTAL PROTEIN ELP: 6.5 g/dL (ref 6.0–8.5)

## 2017-02-19 ENCOUNTER — Other Ambulatory Visit (HOSPITAL_COMMUNITY): Payer: Self-pay | Admitting: Oncology

## 2017-02-19 DIAGNOSIS — C9001 Multiple myeloma in remission: Secondary | ICD-10-CM

## 2017-02-19 MED ORDER — LENALIDOMIDE 10 MG PO CAPS: ORAL_CAPSULE | ORAL | 0 refills | Status: DC

## 2017-02-21 ENCOUNTER — Other Ambulatory Visit: Payer: Self-pay | Admitting: *Deleted

## 2017-02-21 ENCOUNTER — Telehealth: Payer: Self-pay | Admitting: Family Medicine

## 2017-02-21 MED ORDER — PREDNISONE 20 MG PO TABS: ORAL_TABLET | ORAL | 0 refills | Status: DC

## 2017-02-21 MED ORDER — HYDROCODONE-HOMATROPINE 5-1.5 MG/5ML PO SYRP: 5.0000 mL | ORAL_SOLUTION | Freq: Four times a day (QID) | ORAL | 0 refills | Status: DC | PRN

## 2017-02-21 NOTE — Telephone Encounter (Signed)
Discussed with pt. Patient may have 90 mL of Hycodan, 1 teaspoon every 6 hours when necessary cough caution drowsiness home use only, continue albuterol in his other medicines if his breathing issues get worse or discolored phlegm he will need to be seen, also prednisone 20 mg 2 daily for 3 days then 1 a day for 3 days should help with the wheezing- patient states that he is not feeling any type or respiratory distress. Pt to go to ED if having any respiratory distress. Pt verbalized understanding. Prednisone sent to pharm. Hycodan ready for pickup.

## 2017-02-21 NOTE — Telephone Encounter (Signed)
Pt requesting some cough syrup  cough been bothering him for a couple weeks   [facility] [place]   Please call pt when done

## 2017-02-21 NOTE — Telephone Encounter (Signed)
Patient may have 90 mL of Hycodan, 1 teaspoon every 6 hours when necessary cough caution drowsiness home use only, continue albuterol in his other medicines if his breathing issues get worse or discolored phlegm he will need to be seen, also prednisone 20 mg 2 daily for 3 days then 1 a day for 3 days should help with the wheezing-nurses please make sure that the patient states that he is not feeling in any type or respiratory distress please document accordingly-if any type or respiratory distress please let me know

## 2017-02-21 NOTE — Telephone Encounter (Signed)
Coughing up clear mucus, wheezing has inhaler helping some, no fever. Wants to get rx for hycodan cough syrup.

## 2017-02-24 DIAGNOSIS — N281 Cyst of kidney, acquired: Secondary | ICD-10-CM | POA: Diagnosis not present

## 2017-02-24 DIAGNOSIS — C61 Malignant neoplasm of prostate: Secondary | ICD-10-CM | POA: Diagnosis not present

## 2017-02-24 DIAGNOSIS — R350 Frequency of micturition: Secondary | ICD-10-CM | POA: Diagnosis not present

## 2017-02-24 DIAGNOSIS — N320 Bladder-neck obstruction: Secondary | ICD-10-CM | POA: Diagnosis not present

## 2017-02-27 DIAGNOSIS — S40012A Contusion of left shoulder, initial encounter: Secondary | ICD-10-CM | POA: Diagnosis not present

## 2017-02-27 DIAGNOSIS — S5002XA Contusion of left elbow, initial encounter: Secondary | ICD-10-CM | POA: Diagnosis not present

## 2017-03-11 ENCOUNTER — Other Ambulatory Visit (HOSPITAL_COMMUNITY): Payer: Self-pay | Admitting: *Deleted

## 2017-03-11 DIAGNOSIS — C9001 Multiple myeloma in remission: Secondary | ICD-10-CM

## 2017-03-12 ENCOUNTER — Other Ambulatory Visit (HOSPITAL_COMMUNITY): Payer: Self-pay | Admitting: Oncology

## 2017-03-12 ENCOUNTER — Encounter (HOSPITAL_COMMUNITY): Payer: Medicare Other

## 2017-03-12 ENCOUNTER — Encounter (HOSPITAL_COMMUNITY): Payer: Medicare Other | Attending: Oncology

## 2017-03-12 VITALS — BP 131/76 | HR 117 | Temp 98.0°F | Resp 18 | Wt 188.2 lb

## 2017-03-12 DIAGNOSIS — C9001 Multiple myeloma in remission: Secondary | ICD-10-CM

## 2017-03-12 DIAGNOSIS — D801 Nonfamilial hypogammaglobulinemia: Secondary | ICD-10-CM

## 2017-03-12 LAB — COMPREHENSIVE METABOLIC PANEL
ALBUMIN: 3.6 g/dL (ref 3.5–5.0)
ALK PHOS: 66 U/L (ref 38–126)
ALT: 18 U/L (ref 17–63)
ANION GAP: 7 (ref 5–15)
AST: 27 U/L (ref 15–41)
BUN: 10 mg/dL (ref 6–20)
CO2: 24 mmol/L (ref 22–32)
Calcium: 9.1 mg/dL (ref 8.9–10.3)
Chloride: 105 mmol/L (ref 101–111)
Creatinine, Ser: 1.1 mg/dL (ref 0.61–1.24)
GFR calc Af Amer: >60 mL/min (ref >60)
GFR calc non Af Amer: >60 mL/min (ref >60)
GLUCOSE: 104 mg/dL — AB (ref 65–99)
POTASSIUM: 3.9 mmol/L (ref 3.5–5.1)
SODIUM: 136 mmol/L (ref 135–145)
TOTAL PROTEIN: 6.6 g/dL (ref 6.5–8.1)
Total Bilirubin: 0.5 mg/dL (ref 0.3–1.2)

## 2017-03-12 LAB — CBC WITH DIFFERENTIAL/PLATELET
BASOS ABS: 0 10*3/uL (ref 0.0–0.1)
BASOS PCT: 1 %
EOS ABS: 0.2 10*3/uL (ref 0.0–0.7)
Eosinophils Relative: 6 %
HEMATOCRIT: 37.1 % — AB (ref 39.0–52.0)
HEMOGLOBIN: 12.3 g/dL — AB (ref 13.0–17.0)
Lymphocytes Relative: 38 %
Lymphs Abs: 1.1 10*3/uL (ref 0.7–4.0)
MCH: 29.4 pg (ref 26.0–34.0)
MCHC: 33.2 g/dL (ref 30.0–36.0)
MCV: 88.5 fL (ref 78.0–100.0)
Monocytes Absolute: 0.5 10*3/uL (ref 0.1–1.0)
Monocytes Relative: 16 %
NEUTROS ABS: 1.2 10*3/uL — AB (ref 1.7–7.7)
NEUTROS PCT: 39 %
Platelets: 108 10*3/uL — ABNORMAL LOW (ref 150–400)
RBC: 4.19 MIL/uL — ABNORMAL LOW (ref 4.22–5.81)
RDW: 16.3 % — AB (ref 11.5–15.5)
WBC: 3 10*3/uL — ABNORMAL LOW (ref 4.0–10.5)

## 2017-03-12 MED ORDER — SODIUM CHLORIDE 0.9 % IV SOLN: Freq: Once | INTRAVENOUS | Status: DC

## 2017-03-12 MED ORDER — CYANOCOBALAMIN 1000 MCG/ML IJ SOLN
1000.0000 ug | Freq: Once | INTRAMUSCULAR | Status: AC
  Administered 2017-03-12: 1000 ug via INTRAMUSCULAR
  Filled 2017-03-12: qty 1

## 2017-03-12 MED ORDER — IMMUNE GLOBULIN (HUMAN) 10 GM/100ML IV SOLN
400.0000 mg/kg | Freq: Once | INTRAVENOUS | Status: AC
  Administered 2017-03-12: 35 g via INTRAVENOUS
  Filled 2017-03-12: qty 100

## 2017-03-12 MED ORDER — ACETAMINOPHEN 325 MG PO TABS: 650.0000 mg | ORAL_TABLET | Freq: Once | ORAL | Status: DC

## 2017-03-12 MED ORDER — HEPARIN SOD (PORK) LOCK FLUSH 100 UNIT/ML IV SOLN
500.0000 [IU] | Freq: Once | INTRAVENOUS | Status: AC | PRN
Start: 2017-03-12 — End: 2017-03-12
  Administered 2017-03-12: 500 [IU]
  Filled 2017-03-12: qty 5

## 2017-03-12 NOTE — Patient Instructions (Signed)
[place] at [facility] Discharge Instructions  RECOMMENDATIONS MADE BY THE CONSULTANT AND ANY TEST RESULTS WILL BE SENT TO YOUR REFERRING PHYSICIAN.  Vitamin B12 1000 mcg injection given as ordered. IVIG infusion given as ordered. Return as scheduled.  Thank you for choosing [place] at [facility] to provide your oncology and hematology care.  To afford each patient quality time with our provider, please arrive at least 15 minutes before your scheduled appointment time.    If you have a lab appointment with the [facility] please come in thru the  Main Entrance and check in at the main information desk  You need to re-schedule your appointment should you arrive 10 or more minutes late.  We strive to give you quality time with our providers, and arriving late affects you and other patients whose appointments are after yours.  Also, if you no show three or more times for appointments you may be dismissed from the clinic at the providers discretion.     Again, thank you for choosing [facility].  Our hope is that these requests will decrease the amount of time that you wait before being seen by our physicians.       _____________________________________________________________  Should you have questions after your visit to [facility], please contact our office at [phone] between the hours of 8:30 a.m. and 4:30 p.m.  Voicemails left after 4:30 p.m. will not be returned until the following business day.  For prescription refill requests, have your pharmacy contact our office.       Resources For Cancer Patients and their Caregivers ? American Cancer Society: Can assist with transportation, wigs, general needs, runs Look Good Feel Better.        910-157-9786 ? Cancer Care: Provides financial assistance, online support groups, medication/co-pay assistance.  1-800-813-HOPE 316-854-5859) ? [place] Assists [place] cancer patients and their families through emotional , educational and financial support.  201-617-4785 ? [place] DSS Where to apply for food stamps, Medicaid and utility assistance. (343)702-1866 ? RCATS: Transportation to medical appointments. 404 661 6915 ? Social Security Administration: May apply for disability if have a Stage IV cancer. (878) 610-9326 (445)356-9815 ? [employer], Disability and Transit Services: Assists with nutrition, care and transit needs. [place] Support Programs: @10RELATIVEDAYS @ > Cancer Support Group  2nd Tuesday of the month 1pm-2pm, Journey Room  > Creative Journey  3rd Tuesday of the month 1130am-1pm, Journey Room  > Look Good Feel Better  1st Wednesday of the month 10am-12 noon, Journey Room (Call [place] to register [phone])

## 2017-03-12 NOTE — Progress Notes (Signed)
Jeremy Parrish presents today for injection per MD orders. B12 1000 mcg administered IM in left Upper Arm. Administration without incident. Patient tolerated well. Tolerated IVIG infusion well. Stable and ambulatory on discharge home to self.

## 2017-03-13 ENCOUNTER — Other Ambulatory Visit: Payer: Self-pay | Admitting: Cardiovascular Disease

## 2017-03-13 LAB — PROTEIN ELECTROPHORESIS, SERUM
A/G Ratio: 1.4 (ref 0.7–1.7)
Albumin ELP: 3.7 g/dL (ref 2.9–4.4)
Alpha-1-Globulin: 0.2 g/dL (ref 0.0–0.4)
Alpha-2-Globulin: 0.7 g/dL (ref 0.4–1.0)
BETA GLOBULIN: 0.9 g/dL (ref 0.7–1.3)
Gamma Globulin: 0.9 g/dL (ref 0.4–1.8)
Globulin, Total: 2.7 g/dL (ref 2.2–3.9)
Total Protein ELP: 6.4 g/dL (ref 6.0–8.5)

## 2017-03-13 LAB — KAPPA/LAMBDA LIGHT CHAINS
KAPPA FREE LGHT CHN: 36.8 mg/L — AB (ref 3.3–19.4)
Kappa, lambda light chain ratio: 1.39 (ref 0.26–1.65)
LAMDA FREE LIGHT CHAINS: 26.5 mg/L — AB (ref 5.7–26.3)

## 2017-03-13 LAB — IGG, IGA, IGM
IGA: 296 mg/dL (ref 61–437)
IGG (IMMUNOGLOBIN G), SERUM: 770 mg/dL (ref 700–1600)
IGM, SERUM: 27 mg/dL (ref 15–143)

## 2017-03-20 ENCOUNTER — Ambulatory Visit (INDEPENDENT_AMBULATORY_CARE_PROVIDER_SITE_OTHER): Payer: Medicare Other | Admitting: Family Medicine

## 2017-03-20 ENCOUNTER — Encounter: Payer: Self-pay | Admitting: Family Medicine

## 2017-03-20 ENCOUNTER — Ambulatory Visit (HOSPITAL_COMMUNITY)
Admission: RE | Admit: 2017-03-20 | Discharge: 2017-03-20 | Disposition: A | Discharge status: Home / Self Care | Payer: Medicare Other | Source: Ambulatory Visit | Attending: Family Medicine | Admitting: Family Medicine

## 2017-03-20 VITALS — BP 132/70 | Temp 98.5°F | Ht 67.0 in | Wt 187.4 lb

## 2017-03-20 DIAGNOSIS — J181 Lobar pneumonia, unspecified organism: Secondary | ICD-10-CM | POA: Diagnosis not present

## 2017-03-20 DIAGNOSIS — J440 Chronic obstructive pulmonary disease with acute lower respiratory infection: Secondary | ICD-10-CM | POA: Insufficient documentation

## 2017-03-20 DIAGNOSIS — I2510 Atherosclerotic heart disease of native coronary artery without angina pectoris: Secondary | ICD-10-CM | POA: Diagnosis not present

## 2017-03-20 DIAGNOSIS — J189 Pneumonia, unspecified organism: Secondary | ICD-10-CM | POA: Diagnosis not present

## 2017-03-20 DIAGNOSIS — R6889 Other general symptoms and signs: Secondary | ICD-10-CM

## 2017-03-20 DIAGNOSIS — R05 Cough: Secondary | ICD-10-CM | POA: Diagnosis not present

## 2017-03-20 MED ORDER — AMOXICILLIN-POT CLAVULANATE 875-125 MG PO TABS: ORAL_TABLET | ORAL | 0 refills | Status: DC

## 2017-03-20 MED ORDER — CEFTRIAXONE SODIUM 500 MG IJ SOLR
500.0000 mg | Freq: Once | INTRAMUSCULAR | Status: AC
  Administered 2017-03-20: 500 mg via INTRAMUSCULAR

## 2017-03-20 NOTE — Progress Notes (Addendum)
   Subjective:    Patient ID: Jeremy Parrish, male    DOB: August 19, 1941, 76 y.o.   MRN: [id number]  Influenza  This is a new problem. The current episode started yesterday. Associated symptoms include congestion, coughing, a fever and myalgias. Associated symptoms comments: Diarrhea, Wheezing . Treatments tried: Alka Seltzer Plus   The patient relates some body aches but he also relates several days of head congestion drainage some coughing denies any wheezing or difficulty breathing. Patient has concerns of dizziness. Onset 1 month ago.  Patient does have history of COPD which puts increased risk of pneumonia Review of Systems  Constitutional: Positive for fever.  HENT: Positive for congestion.   Respiratory: Positive for cough.   Musculoskeletal: Positive for myalgias.       Objective:   Physical Exam  Right lower lobe with some rails not respiratory distress HEENT benign extremities no edema      Assessment & Plan:  Viral like illness for several days Secondary coughing low-grade fever signs consistent with possible early pneumonia right base X-ray order Shot of Rocephin Antibiotics prescribed  If ongoing dizziness he will need a follow-up vital signs good

## 2017-03-26 ENCOUNTER — Telehealth: Payer: Self-pay | Admitting: Family Medicine

## 2017-03-26 ENCOUNTER — Ambulatory Visit (INDEPENDENT_AMBULATORY_CARE_PROVIDER_SITE_OTHER): Payer: Medicare Other | Admitting: *Deleted

## 2017-03-26 ENCOUNTER — Telehealth: Payer: Self-pay | Admitting: Cardiology

## 2017-03-26 DIAGNOSIS — I495 Sick sinus syndrome: Secondary | ICD-10-CM | POA: Diagnosis not present

## 2017-03-26 DIAGNOSIS — I480 Paroxysmal atrial fibrillation: Secondary | ICD-10-CM

## 2017-03-26 MED ORDER — HYDROCODONE-HOMATROPINE 5-1.5 MG/5ML PO SYRP: ORAL_SOLUTION | ORAL | 0 refills | Status: DC

## 2017-03-26 NOTE — Telephone Encounter (Signed)
Spoke with patient and informed him per Dr.Scott Luking- we have a prescription for Hycodan cough syrup. Do not take with Hydrocodone pain medicine. Patient verbalized understanding.

## 2017-03-26 NOTE — Telephone Encounter (Signed)
Spoke with pt and reminded pt of remote transmission that is due today. Pt verbalized understanding.   

## 2017-03-26 NOTE — Telephone Encounter (Signed)
Hycodan cough syrup, 90 mL's, 1 teaspoon every 8 hours when necessary cough, home use only, caution drowsiness, do not take with hydrocodone pain medicine

## 2017-03-26 NOTE — Telephone Encounter (Signed)
Pt is requesting cough syrup to be called in.     [facility]

## 2017-03-31 ENCOUNTER — Other Ambulatory Visit (HOSPITAL_COMMUNITY): Payer: Self-pay | Admitting: Oncology

## 2017-03-31 DIAGNOSIS — C9001 Multiple myeloma in remission: Secondary | ICD-10-CM

## 2017-03-31 MED ORDER — LENALIDOMIDE 10 MG PO CAPS: ORAL_CAPSULE | ORAL | 0 refills | Status: DC

## 2017-04-08 ENCOUNTER — Telehealth: Payer: Self-pay | Admitting: Family Medicine

## 2017-04-08 NOTE — Telephone Encounter (Signed)
Tonight abx oitment and dressing non stick pad if they have it, if not ok just gauze dressong

## 2017-04-08 NOTE — Telephone Encounter (Signed)
Pt fell out the door sat and tore all the skin off of his forearm. Wife is wanting to know what to put on it. Pt has an appt tomorrow with skin specialist. Please advise.

## 2017-04-08 NOTE — Telephone Encounter (Signed)
Left message to return call 

## 2017-04-09 ENCOUNTER — Other Ambulatory Visit (HOSPITAL_COMMUNITY): Payer: Medicare Other

## 2017-04-09 ENCOUNTER — Ambulatory Visit (HOSPITAL_COMMUNITY): Payer: Medicare Other

## 2017-04-09 DIAGNOSIS — L814 Other melanin hyperpigmentation: Secondary | ICD-10-CM | POA: Diagnosis not present

## 2017-04-09 DIAGNOSIS — C44222 Squamous cell carcinoma of skin of right ear and external auricular canal: Secondary | ICD-10-CM | POA: Diagnosis not present

## 2017-04-09 DIAGNOSIS — L570 Actinic keratosis: Secondary | ICD-10-CM | POA: Diagnosis not present

## 2017-04-09 DIAGNOSIS — Z85828 Personal history of other malignant neoplasm of skin: Secondary | ICD-10-CM | POA: Diagnosis not present

## 2017-04-09 DIAGNOSIS — L821 Other seborrheic keratosis: Secondary | ICD-10-CM | POA: Diagnosis not present

## 2017-04-09 DIAGNOSIS — D1801 Hemangioma of skin and subcutaneous tissue: Secondary | ICD-10-CM | POA: Diagnosis not present

## 2017-04-09 DIAGNOSIS — D485 Neoplasm of uncertain behavior of skin: Secondary | ICD-10-CM | POA: Diagnosis not present

## 2017-04-09 NOTE — Telephone Encounter (Signed)
Spoke with patient's wife and informed her per Dr.Steve Luking- Antibiotic oitment and dressing non stick pad if they have it, if not ok just gauze dressing. Patient's wife verbalized understanding.

## 2017-04-09 NOTE — Telephone Encounter (Signed)
Left message return call 04/09/2017

## 2017-04-14 ENCOUNTER — Other Ambulatory Visit (HOSPITAL_COMMUNITY): Payer: Self-pay | Admitting: *Deleted

## 2017-04-14 DIAGNOSIS — C9001 Multiple myeloma in remission: Secondary | ICD-10-CM

## 2017-04-14 NOTE — Progress Notes (Signed)
Remote PPM transmission.

## 2017-04-15 ENCOUNTER — Encounter (HOSPITAL_BASED_OUTPATIENT_CLINIC_OR_DEPARTMENT_OTHER): Payer: Medicare Other

## 2017-04-15 ENCOUNTER — Encounter (HOSPITAL_COMMUNITY): Payer: Self-pay | Admitting: Adult Health

## 2017-04-15 ENCOUNTER — Encounter (HOSPITAL_COMMUNITY): Payer: Medicare Other | Attending: Adult Health | Admitting: Adult Health

## 2017-04-15 ENCOUNTER — Telehealth: Payer: Self-pay | Admitting: Family Medicine

## 2017-04-15 ENCOUNTER — Encounter (HOSPITAL_COMMUNITY): Payer: Medicare Other

## 2017-04-15 VITALS — BP 133/68 | HR 69 | Temp 97.4°F | Resp 18 | Ht 67.0 in | Wt 186.0 lb

## 2017-04-15 VITALS — BP 120/62 | HR 60 | Temp 97.7°F | Resp 18

## 2017-04-15 DIAGNOSIS — E785 Hyperlipidemia, unspecified: Secondary | ICD-10-CM | POA: Diagnosis not present

## 2017-04-15 DIAGNOSIS — D801 Nonfamilial hypogammaglobulinemia: Secondary | ICD-10-CM

## 2017-04-15 DIAGNOSIS — G629 Polyneuropathy, unspecified: Secondary | ICD-10-CM | POA: Diagnosis not present

## 2017-04-15 DIAGNOSIS — Z8546 Personal history of malignant neoplasm of prostate: Secondary | ICD-10-CM | POA: Insufficient documentation

## 2017-04-15 DIAGNOSIS — M791 Myalgia: Secondary | ICD-10-CM | POA: Diagnosis not present

## 2017-04-15 DIAGNOSIS — Z87442 Personal history of urinary calculi: Secondary | ICD-10-CM | POA: Insufficient documentation

## 2017-04-15 DIAGNOSIS — I110 Hypertensive heart disease with heart failure: Secondary | ICD-10-CM | POA: Diagnosis not present

## 2017-04-15 DIAGNOSIS — N320 Bladder-neck obstruction: Secondary | ICD-10-CM | POA: Diagnosis not present

## 2017-04-15 DIAGNOSIS — E538 Deficiency of other specified B group vitamins: Secondary | ICD-10-CM

## 2017-04-15 DIAGNOSIS — I2510 Atherosclerotic heart disease of native coronary artery without angina pectoris: Secondary | ICD-10-CM | POA: Insufficient documentation

## 2017-04-15 DIAGNOSIS — Z800 Family history of malignant neoplasm of digestive organs: Secondary | ICD-10-CM | POA: Insufficient documentation

## 2017-04-15 DIAGNOSIS — M25512 Pain in left shoulder: Secondary | ICD-10-CM | POA: Insufficient documentation

## 2017-04-15 DIAGNOSIS — C9001 Multiple myeloma in remission: Secondary | ICD-10-CM | POA: Diagnosis not present

## 2017-04-15 DIAGNOSIS — I509 Heart failure, unspecified: Secondary | ICD-10-CM | POA: Diagnosis not present

## 2017-04-15 DIAGNOSIS — I252 Old myocardial infarction: Secondary | ICD-10-CM | POA: Diagnosis not present

## 2017-04-15 DIAGNOSIS — F329 Major depressive disorder, single episode, unspecified: Secondary | ICD-10-CM | POA: Diagnosis not present

## 2017-04-15 DIAGNOSIS — M62838 Other muscle spasm: Secondary | ICD-10-CM

## 2017-04-15 DIAGNOSIS — J449 Chronic obstructive pulmonary disease, unspecified: Secondary | ICD-10-CM | POA: Diagnosis not present

## 2017-04-15 DIAGNOSIS — Z8551 Personal history of malignant neoplasm of bladder: Secondary | ICD-10-CM | POA: Diagnosis not present

## 2017-04-15 DIAGNOSIS — Z8371 Family history of colonic polyps: Secondary | ICD-10-CM | POA: Insufficient documentation

## 2017-04-15 DIAGNOSIS — R42 Dizziness and giddiness: Secondary | ICD-10-CM | POA: Insufficient documentation

## 2017-04-15 DIAGNOSIS — Z8052 Family history of malignant neoplasm of bladder: Secondary | ICD-10-CM | POA: Insufficient documentation

## 2017-04-15 DIAGNOSIS — I739 Peripheral vascular disease, unspecified: Secondary | ICD-10-CM | POA: Diagnosis not present

## 2017-04-15 DIAGNOSIS — Z8572 Personal history of non-Hodgkin lymphomas: Secondary | ICD-10-CM

## 2017-04-15 DIAGNOSIS — Z9889 Other specified postprocedural states: Secondary | ICD-10-CM | POA: Insufficient documentation

## 2017-04-15 DIAGNOSIS — N321 Vesicointestinal fistula: Secondary | ICD-10-CM | POA: Diagnosis not present

## 2017-04-15 DIAGNOSIS — Z87891 Personal history of nicotine dependence: Secondary | ICD-10-CM | POA: Insufficient documentation

## 2017-04-15 DIAGNOSIS — K219 Gastro-esophageal reflux disease without esophagitis: Secondary | ICD-10-CM | POA: Insufficient documentation

## 2017-04-15 DIAGNOSIS — Z888 Allergy status to other drugs, medicaments and biological substances status: Secondary | ICD-10-CM | POA: Insufficient documentation

## 2017-04-15 DIAGNOSIS — Z8673 Personal history of transient ischemic attack (TIA), and cerebral infarction without residual deficits: Secondary | ICD-10-CM | POA: Insufficient documentation

## 2017-04-15 DIAGNOSIS — Z79899 Other long term (current) drug therapy: Secondary | ICD-10-CM | POA: Insufficient documentation

## 2017-04-15 DIAGNOSIS — I480 Paroxysmal atrial fibrillation: Secondary | ICD-10-CM | POA: Insufficient documentation

## 2017-04-15 DIAGNOSIS — Z8679 Personal history of other diseases of the circulatory system: Secondary | ICD-10-CM | POA: Insufficient documentation

## 2017-04-15 DIAGNOSIS — G4730 Sleep apnea, unspecified: Secondary | ICD-10-CM | POA: Diagnosis not present

## 2017-04-15 DIAGNOSIS — Z8249 Family history of ischemic heart disease and other diseases of the circulatory system: Secondary | ICD-10-CM | POA: Insufficient documentation

## 2017-04-15 LAB — CBC WITH DIFFERENTIAL/PLATELET
Basophils Absolute: 0 10*3/uL (ref 0.0–0.1)
Basophils Relative: 1 %
Eosinophils Absolute: 0.2 10*3/uL (ref 0.0–0.7)
Eosinophils Relative: 4 %
HEMATOCRIT: 35.4 % — AB (ref 39.0–52.0)
HEMOGLOBIN: 11.6 g/dL — AB (ref 13.0–17.0)
Lymphocytes Relative: 31 %
Lymphs Abs: 1.2 10*3/uL (ref 0.7–4.0)
MCH: 29.2 pg (ref 26.0–34.0)
MCHC: 32.8 g/dL (ref 30.0–36.0)
MCV: 89.2 fL (ref 78.0–100.0)
Monocytes Absolute: 0.6 10*3/uL (ref 0.1–1.0)
Monocytes Relative: 15 %
NEUTROS ABS: 1.9 10*3/uL (ref 1.7–7.7)
NEUTROS PCT: 49 %
Platelets: 119 10*3/uL — ABNORMAL LOW (ref 150–400)
RBC: 3.97 MIL/uL — ABNORMAL LOW (ref 4.22–5.81)
RDW: 15.3 % (ref 11.5–15.5)
WBC: 3.9 10*3/uL — ABNORMAL LOW (ref 4.0–10.5)

## 2017-04-15 LAB — COMPREHENSIVE METABOLIC PANEL
ALBUMIN: 3.6 g/dL (ref 3.5–5.0)
ALK PHOS: 67 U/L (ref 38–126)
ALT: 15 U/L — ABNORMAL LOW (ref 17–63)
ANION GAP: 7 (ref 5–15)
AST: 22 U/L (ref 15–41)
BILIRUBIN TOTAL: 0.5 mg/dL (ref 0.3–1.2)
BUN: 11 mg/dL (ref 6–20)
CALCIUM: 9 mg/dL (ref 8.9–10.3)
CHLORIDE: 106 mmol/L (ref 101–111)
CO2: 24 mmol/L (ref 22–32)
CREATININE: 0.96 mg/dL (ref 0.61–1.24)
Glucose, Bld: 116 mg/dL — ABNORMAL HIGH (ref 65–99)
Potassium: 3.7 mmol/L (ref 3.5–5.1)
Sodium: 137 mmol/L (ref 135–145)
Total Protein: 6.6 g/dL (ref 6.5–8.1)

## 2017-04-15 MED ORDER — CYANOCOBALAMIN 1000 MCG/ML IJ SOLN
1000.0000 ug | Freq: Once | INTRAMUSCULAR | Status: AC
  Administered 2017-04-15: 1000 ug via INTRAMUSCULAR
  Filled 2017-04-15: qty 1

## 2017-04-15 MED ORDER — CHLORZOXAZONE 500 MG PO TABS: 500.0000 mg | ORAL_TABLET | Freq: Three times a day (TID) | ORAL | 2 refills | Status: DC | PRN

## 2017-04-15 MED ORDER — SODIUM CHLORIDE 0.9 % IV SOLN: Freq: Once | INTRAVENOUS | Status: DC

## 2017-04-15 MED ORDER — IMMUNE GLOBULIN (HUMAN) 10 GM/100ML IV SOLN
400.0000 mg/kg | Freq: Once | INTRAVENOUS | Status: AC
  Administered 2017-04-15: 35 g via INTRAVENOUS
  Filled 2017-04-15: qty 50

## 2017-04-15 MED ORDER — HEPARIN SOD (PORK) LOCK FLUSH 100 UNIT/ML IV SOLN
500.0000 [IU] | Freq: Once | INTRAVENOUS | Status: AC | PRN
  Administered 2017-04-15: 500 [IU]
  Filled 2017-04-15: qty 5

## 2017-04-15 MED ORDER — ACETAMINOPHEN 325 MG PO TABS
650.0000 mg | ORAL_TABLET | Freq: Once | ORAL | Status: AC
  Administered 2017-04-15: 650 mg via ORAL
  Filled 2017-04-15: qty 2

## 2017-04-15 NOTE — Patient Instructions (Addendum)
[place] at [facility] Discharge Instructions  RECOMMENDATIONS MADE BY THE CONSULTANT AND ANY TEST RESULTS WILL BE SENT TO YOUR REFERRING PHYSICIAN.  You received IVIG infusion today Return as scheduled   Thank you for choosing [place] at [facility] to provide your oncology and hematology care.  To afford each patient quality time with our provider, please arrive at least 15 minutes before your scheduled appointment time.    If you have a lab appointment with the [facility] please come in thru the  Main Entrance and check in at the main information desk  You need to re-schedule your appointment should you arrive 10 or more minutes late.  We strive to give you quality time with our providers, and arriving late affects you and other patients whose appointments are after yours.  Also, if you no show three or more times for appointments you may be dismissed from the clinic at the providers discretion.     Again, thank you for choosing [facility].  Our hope is that these requests will decrease the amount of time that you wait before being seen by our physicians.       _____________________________________________________________  Should you have questions after your visit to [facility], please contact our office at [phone] between the hours of 8:30 a.m. and 4:30 p.m.  Voicemails left after 4:30 p.m. will not be returned until the following business day.  For prescription refill requests, have your pharmacy contact our office.       Resources For Cancer Patients and their Caregivers ? American Cancer Society: Can assist with transportation, wigs, general needs, runs Look Good Feel Better.        331-809-1584 ? Cancer Care: Provides financial assistance, online support groups, medication/co-pay assistance.  1-800-813-HOPE 410-790-2059) ? [place] Assists [place] cancer  patients and their families through emotional , educational and financial support.  320-711-3091 ? [place] DSS Where to apply for food stamps, Medicaid and utility assistance. (503)763-1779 ? RCATS: Transportation to medical appointments. (315)612-1008 ? Social Security Administration: May apply for disability if have a Stage IV cancer. 854-058-9471 (765) 201-2178 ? [employer], Disability and Transit Services: Assists with nutrition, care and transit needs. [place] Support Programs: @10RELATIVEDAYS @ > Cancer Support Group  2nd Tuesday of the month 1pm-2pm, Journey Room  > Creative Journey  3rd Tuesday of the month 1130am-1pm, Journey Room  > Look Good Feel Better  1st Wednesday of the month 10am-12 noon, Journey Room (Call [place] to register [phone])

## 2017-04-15 NOTE — Telephone Encounter (Signed)
Review dermatopathology report faxed from [facility] in results folder.

## 2017-04-15 NOTE — Patient Instructions (Addendum)
[place] at [facility] Discharge Instructions  RECOMMENDATIONS MADE BY THE CONSULTANT AND ANY TEST RESULTS WILL BE SENT TO YOUR REFERRING PHYSICIAN.  You were seen today by Mike Craze NP. Continue monthly IVIG. Return in 1 months for labs and follow up,   Thank you for choosing [place] at [facility] to provide your oncology and hematology care.  To afford each patient quality time with our provider, please arrive at least 15 minutes before your scheduled appointment time.    If you have a lab appointment with the [facility] please come in thru the  Main Entrance and check in at the main information desk  You need to re-schedule your appointment should you arrive 10 or more minutes late.  We strive to give you quality time with our providers, and arriving late affects you and other patients whose appointments are after yours.  Also, if you no show three or more times for appointments you may be dismissed from the clinic at the providers discretion.     Again, thank you for choosing [facility].  Our hope is that these requests will decrease the amount of time that you wait before being seen by our physicians.       _____________________________________________________________  Should you have questions after your visit to [facility], please contact our office at [phone] between the hours of 8:30 a.m. and 4:30 p.m.  Voicemails left after 4:30 p.m. will not be returned until the following business day.  For prescription refill requests, have your pharmacy contact our office.       Resources For Cancer Patients and their Caregivers ? American Cancer Society: Can assist with transportation, wigs, general needs, runs Look Good Feel Better.        603-453-4729 ? Cancer Care: Provides financial assistance, online support groups, medication/co-pay assistance.  1-800-813-HOPE 712-160-3855) ? [place] Assists [place] cancer patients and their families through emotional , educational and financial support.  (515)522-8795 ? [place] DSS Where to apply for food stamps, Medicaid and utility assistance. 8151947079 ? RCATS: Transportation to medical appointments. 209-390-8029 ? Social Security Administration: May apply for disability if have a Stage IV cancer. 985-721-7157 614-598-9041 ? [employer], Disability and Transit Services: Assists with nutrition, care and transit needs. [place] Support Programs: @10RELATIVEDAYS @ > Cancer Support Group  2nd Tuesday of the month 1pm-2pm, Journey Room  > Creative Journey  3rd Tuesday of the month 1130am-1pm, Journey Room  > Look Good Feel Better  1st Wednesday of the month 10am-12 noon, Journey Room (Call [place] to register [phone])

## 2017-04-15 NOTE — Progress Notes (Signed)
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 Hoh 76 y.o. male returns for follow-up for history of multiple myeloma and lymphoma, as well as hypogammaglobulinemia.   Overall, he tells me he has been feeling "pretty good."  His appetite is 100%; energy levels 75%. Denies any recent infections, fever, or chills. He has chronic sinus/chest congestion with periodic cough and shortness of breath.  Reports some leg swelling "at the end of the day when I've been on my feet, but it's not too bad."    Continues on Revlimid and tolerating well. Denies rash. Endorses occasional diarrhea for which he takes Imodium and relieves his symptoms.  Denies any dysuria or hematuria.  Endorses dizziness, particularly when going from a seated to standing position. He tries to drink plenty of water, but admittedly thinks he can do better.  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   His biggest complaint today is myalgias and recent falls.  States that his hands and inner thighs cramp, particularly first thing in the morning.  He has tried tonic water with quinine, mustard, and vinegar which only minimally improved his symptoms.  His PCP, Dr. Wolfgang Phoenix, gave him a prescription for chlorzoxazone 500 mg TIDprn, which did help his spasms. Mr. Moder is requesting a refill of this medication  today.  He had 1 fall since his last visit to see [place]. Denies syncope; reports that he "lost his balance."  He has a walker and a cane, but does not consistently use either. Endorses (L) shoulder pain with decreased mobility; this has been chronic and is ongoing.   He was recently diagnosed with a skin cancer to his right ear. States that he has an upcoming appointment with his dermatologist "so they can remove more of that area because there is cancer there."    REVIEW OF SYSTEMS:  Review of Systems  Constitutional: Negative.  Negative for chills, fatigue and fever.  HENT:  Negative.  Negative for lump/mass and nosebleeds.   Eyes: Negative.   Respiratory: Positive for cough and shortness of breath.        Chronic congestion   Cardiovascular: Positive for leg swelling. Negative for chest pain.  Gastrointestinal: Positive for diarrhea. Negative for abdominal pain, blood in stool, constipation, nausea and vomiting.  Endocrine: Negative.   Genitourinary: Negative.  Negative for dysuria and hematuria.   Musculoskeletal: Positive for myalgias. Negative for arthralgias.  Skin: Negative.  Negative for rash.  Neurological: Positive for dizziness and numbness. Negative for headaches.  Hematological: Negative.  Negative for adenopathy. Does not bruise/bleed easily.  Psychiatric/Behavioral: Negative.  Negative for depression and sleep disturbance. The patient is not nervous/anxious.      PAST MEDICAL/SURGICAL HISTORY:  Past Medical History:  Diagnosis Date  . Anemia   . Aortic aneurysm of unspecified site without mention of rupture   . Arthritis   .  Bladder neck contracture   . Cancer ([place])   . Cerebral atherosclerosis    Carotid Doppler, 02/16/2013 - Bilateral Proximal ICAs,demonstrate mild plaque w/o evidence of significant diameter reduction, dissection, or any other vascular abnormality  . CHF (congestive heart failure) ([place])   . Complication of anesthesia   . COPD (chronic obstructive  pulmonary disease) ([place])   . Coronary artery disease   . Depression   . Esophageal reflux   . Heart disease   . Heart murmur   . Hx of bladder cancer 10/07/2012  . Hyperlipidemia   . Hypertension   . Hypogammaglobulinemia ([place]) 09/28/2012   Secondary to Lymphoma and Multiple Myeloma and their treatments  . Intestinovesical fistula   . Kidney stones    history  . Lung mass   . Multiple myeloma   . Myocardial infarction [facility])    '96  . Non Hodgkin's lymphoma ([place])   . Paroxysmal atrial fibrillation ([place]) 01/02/2016  . Peripheral arterial disease ([place])   . Personal history of other diseases of circulatory system   . PONV (postoperative nausea and vomiting)   . Prostate cancer ([place]) 2000  . Shingles   . Shortness of breath   . Sleep apnea    05-02-14 cpap , not yet used- suggested settings 5  . Stroke [facility]) 2013   Speech.   Past Surgical History:  Procedure Laterality Date  . BLADDER SURGERY    . BONE MARROW TRANSPLANT  2011  . COLON SURGERY     colon resection  . COLONOSCOPY N/A 01/01/2013   Procedure: COLONOSCOPY;  Surgeon: Rogene Houston, MD;  Location: [facility] END[facility];  Service: Endoscopy;  Laterality: N/A;  825-moved to [place] notified pt  . CORONARY ANGIOPLASTY  06/24/2000   PCI and stenting in mid & proximal RCA  . heart stents x 5  1999  . INGUINAL HERNIA REPAIR Right 05/04/2014   Procedure: OPEN RIGHT INGUINAL HERNIA REPAIR with mesh;  Surgeon: Edward Jolly, MD;  Location: [facility] ORS;  Service: General;  Laterality: Right;  . INSERT / REPLACE / REMOVE PACEMAKER    . left ear skin cancer removed    . NM MYOCAR PERF WALL MOTION  11/27/2007   inferior scar  . PACEMAKER INSERTION  07/22/2011   [employer]  . PORTACATH PLACEMENT  07/26/2009   right chest  . PROSTATE SURGERY    . Rotator    . ROTATOR CUFF REPAIR Right   . SHOULDER ARTHROSCOPY WITH SUBACROMIAL DECOMPRESSION Right 07/21/2013   Procedure: RIGHT SHOULDER ARTHROSCOPY WITH SUBACROMIAL DECOMPRESSION AND  DEBRIDEMENT & Injection of Left Shoulder;  Surgeon: Alta Corning, MD;  Location: [place];  Service: Orthopedics;  Laterality: Right;  . TEE WITHOUT CARDIOVERSION  10/13/2012   Procedure: TRANSESOPHAGEAL ECHOCARDIOGRAM (TEE);  Surgeon: Sanda Klein, MD;  Location: [facility] ENDOSCOPY;  Service: Cardiovascular;  Laterality: N/A;  pat/kay/echo notified  . US ECHOCARDIOGRAPHY  06/19/2011   RV mildly dilated,mild to mod. MR,mild AI,mild PI  . WRIST SURGERY     right     SOCIAL HISTORY:  Social History   Social History  . Marital status: Married    Spouse name: [name]  . Number of children: 3  . Years of education: 9th   Occupational History  . retired     Social History Main Topics  . Smoking status: Former Smoker    Packs/day: 1.00    Years: 20.00    Types: Cigarettes    Quit date: 11/14/1994  . Smokeless tobacco:  Never Used  . Alcohol use No     Comment: previously drank but none for at least 15 years.  . Drug use: No  . Sexual activity: Not on file   Other Topics Concern  . Not on file   Social History Narrative   Patient lives at home spouse.   Caffeine Use: Occasionally    FAMILY HISTORY:  Family History  Problem Relation Age of Onset  . Cancer Father        bladder  . Heart disease Father        before age 97  . Hypertension Mother   . Cancer Brother   . Heart disease Brother        before age 59  . Heart disease Sister        before age 69  . Hyperlipidemia Sister   . Hypertension Sister   . Heart attack Sister   . Colon cancer Neg Hx   . Colon polyps Neg Hx     CURRENT MEDICATIONS:  Outpatient Encounter Prescriptions as of 04/15/2017  Medication Sig Note  . albuterol (PROVENTIL HFA;VENTOLIN HFA) 108 (90 Base) MCG/ACT inhaler Inhale 2 puffs into the lungs every 6 (six) hours as needed for wheezing or shortness of breath.   Marland Kitchen albuterol (PROVENTIL) (2.5 MG/3ML) 0.083% nebulizer solution Use via neb q 4 hours prn wheezing (Patient taking differently: Take 2.5 mg  by nebulization every 4 (four) hours as needed for wheezing or shortness of breath. )   . ALPRAZolam (XANAX) 0.5 MG tablet Take 1-2 tablets (0.5-1 mg total) by mouth at bedtime as needed for anxiety.   Marland Kitchen amLODipine (NORVASC) 10 MG tablet TAKE 1 TABLET (10 MG TOTAL) BY MOUTH EVERY MORNING.   Marland Kitchen amoxicillin-clavulanate (AUGMENTIN) 875-125 MG tablet Take 1 tablet by mouth twice daily for 10 days.   . budesonide (PULMICORT) 0.5 MG/2ML nebulizer solution Take 2 mLs (0.5 mg total) by nebulization 2 (two) times daily. (Patient taking differently: Take 0.5 mg by nebulization daily as needed (for shortness of breath). )   . chlorzoxazone (PARAFON) 500 MG tablet Take 1 tablet (500 mg total) by mouth 3 (three) times daily as needed for muscle spasms.   . citalopram (CELEXA) 40 MG tablet TAKE 1 TABLET (40 MG TOTAL) BY MOUTH DAILY.   Marland Kitchen clopidogrel (PLAVIX) 75 MG tablet TAKE 1 TABLET BY MOUTH EVERY DAY   . ELIQUIS 5 MG TABS tablet TAKE 1 TABLET BY MOUTH TWICE A DAY   . furosemide (LASIX) 20 MG tablet USE SPARINGLY AS NEEDED FOR EDEMA NO MORE THAN ONE PER WEEK   . griseofulvin (GRIFULVIN V) 500 MG tablet Take 1 tablet (500 mg total) by mouth daily.   Marland Kitchen HYDROcodone-acetaminophen (NORCO) 10-325 MG tablet Take 1 tablet by mouth every 4 (four) hours as needed.   Marland Kitchen HYDROcodone-homatropine (HYCODAN) 5-1.5 MG/5ML syrup Take 1 teaspoon by mouth every 8 hours as needed for cough. Caution Drowsiness. Do Not take with Hydrocodone   . ketoconazole (NIZORAL) 2 % cream APPLY 1 APPLICATION TOPICALLY 2 (TWO) TIMES DAILY.   Marland Kitchen KLOR-CON M20 20 MEQ tablet Take 1 tablet (20 mEq total) by mouth 3 (three) times daily. Reported on 11/15/2015   . lenalidomide (REVLIMID) 10 MG capsule TAKE 1 CAPSULE BY MOUTH DAILY FOR 7 DAYS ON FOLLOWED BY 7 DAYS OFF. THEN REPEAT CYCLE.   Marland Kitchen levocetirizine (XYZAL) 5 MG tablet Take 5 mg by mouth daily as needed for allergies.    Marland Kitchen LYRICA 75 MG capsule  07/04/2016: Received from: External Pharmacy  . MYRBETRIQ  25 MG TB24 tablet    . nitroGLYCERIN (NITROSTAT) 0.4 MG SL tablet Place 1 tablet (0.4 mg total) under the tongue every 5 (five) minutes as needed for chest pain.   Marland Kitchen omeprazole (PRILOSEC) 20 MG capsule TAKE 1 CAPSULE (20 MG TOTAL) BY MOUTH DAILY.   Marland Kitchen potassium chloride SA (K-DUR,KLOR-CON) 20 MEQ tablet Take 1 tablet (20 mEq total) by mouth 3 (three) times daily.   . predniSONE (DELTASONE) 20 MG tablet    . simvastatin (ZOCOR) 20 MG tablet TAKE 1 TABLET (20 MG TOTAL) BY MOUTH AT BEDTIME.   . [DISCONTINUED] chlorzoxazone (PARAFON) 500 MG tablet Take 1 tablet (500 mg total) by mouth 3 (three) times daily as needed for muscle spasms.    Facility-Administered Encounter Medications as of 04/15/2017  Medication  . acetaminophen (TYLENOL) tablet 650 mg  . sodium chloride 0.9 % injection 10 mL    ALLERGIES:  Allergies  Allergen Reactions  . Morphine And Related Other (See Comments)    hallucinations  . Tape Rash    Paper tape is ok     PHYSICAL EXAM:  ECOG Performance status: 1-2 - Symptomatic; requires occasional assistance, but is largely independent.   Vitals:   04/15/17 0931  BP: 133/68  Pulse: 69  Resp: 18  Temp: 97.4 F (36.3 C)   Filed Weights   04/15/17 0931  Weight: 186 lb (84.4 kg)    Physical Exam  Constitutional: He is oriented to person, place, and time and well-developed, well-nourished, and in no distress.  HENT:  Head: Normocephalic.  Mouth/Throat: Oropharynx is clear and moist. No oropharyngeal exudate.  (R) ear helix deformity; also scabbing noted to concha area of outer ear.   Eyes: Conjunctivae are normal. Pupils are equal, round, and reactive to light. No scleral icterus.  Neck: Normal range of motion. Neck supple.  Cardiovascular: Normal rate and regular rhythm.   Pulmonary/Chest: Effort normal and breath sounds normal. No respiratory distress. He has no wheezes.  Abdominal: Soft. Bowel sounds are normal. There is no tenderness. There is no rebound and no  guarding.  Musculoskeletal: He exhibits no edema.  -Decreased shoulder mobility bilaterally (left worse than right).  -Mild LE weakness noted (guarding as he got onto exam table).   Lymphadenopathy:    He has no cervical adenopathy.    He has no axillary adenopathy.       Right: No inguinal and no supraclavicular adenopathy present.       Left: No inguinal and no supraclavicular adenopathy present.  Neurological: He is alert and oriented to person, place, and time.  Stuttered and garbled speech at times   Skin: Skin is warm and dry. No rash noted.  Psychiatric: Mood, memory, affect and judgment normal.  Nursing note and vitals reviewed.    LABORATORY DATA:  I have reviewed the labs as listed.  CBC    Component Value Date/Time   WBC 3.9 (L) 04/15/2017 0835   RBC 3.97 (L) 04/15/2017 0835   HGB 11.6 (L) 04/15/2017 0835   HCT 35.4 (L) 04/15/2017 0835   PLT 119 (L) 04/15/2017 0835   MCV 89.2 04/15/2017 0835   MCH 29.2 04/15/2017 0835   MCHC 32.8 04/15/2017 0835   RDW 15.3 04/15/2017 0835   LYMPHSABS 1.2 04/15/2017 0835   MONOABS 0.6 04/15/2017 0835   EOSABS 0.2 04/15/2017 0835   BASOSABS 0.0 04/15/2017 0835   CMP Latest Ref Rng & Units 04/15/2017 03/12/2017 02/10/2017  Glucose 65 - 99 mg/dL 116(H) 104(H) 140(H)  BUN 6 - 20 mg/dL '11 10 11  '$ Creatinine 0.61 - 1.24 mg/dL 0.96 1.10 1.01  Sodium 135 - 145 mmol/L 137 136 137  Potassium 3.5 - 5.1 mmol/L 3.7 3.9 4.0  Chloride 101 - 111 mmol/L 106 105 106  CO2 22 - 32 mmol/L '24 24 24  '$ Calcium 8.9 - 10.3 mg/dL 9.0 9.1 9.2  Total Protein 6.5 - 8.1 g/dL 6.6 6.6 7.0  Total Bilirubin 0.3 - 1.2 mg/dL 0.5 0.5 0.8  Alkaline Phos 38 - 126 U/L 67 66 73  AST 15 - 41 U/L '22 27 26  '$ ALT 17 - 63 U/L 15(L) 18 15(L)    PENDING LABS:  Myeloma labs pending   DIAGNOSTIC IMAGING:    PATHOLOGY:     ASSESSMENT & PLAN:   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 Recommendations from [facility] to continue maintenance Revlimid beyond the typical 2 years of maintenance therapy.  Continues to see transplant team annually.  -Tolerating Revlimid well; continue as directed.  M-spike has remained undetectable, as expected.  Myeloma labs are pending for today. We will contact him with the results when they are available.  -Return to cancer center in 1 month  Marginal zone lymphoma: -Diagnosed in 05/2006 after lung biopsy; s/p R-CHOP chemotherapy x 6 cycles. Has been without evidence of disease since that time.  -No lymphadenopathy to the cervical, supraclavicular, infraclavicular, axillary, or inguinal lymph nodes on physical exam today. No B-symptoms. Will continue to monitor.   Hypogammaglobulinemia: -IVIG every 4 weeks for history of recurrent infections.  -Due for infusion today. Will proceed as scheduled.  -Return monthly for IVIG.   Vitamin B12 deficiency:  -Continue monthly B12 injections. He will get injection today as scheduled.  Myalgias:  -Previously prescribed Chlorzoxazone by his PCP. I am happy to refill this medication for him today, as it is helpful with his myalgias.  -[facility]   Dizziness/Recent falls/Shoulder pain:  -Reinforced falls precautions. Strongly recommended him to start using his walker and/or cane more consistently to help prevent falls.  -Offered to refer him for both OT (for shoulder pain), as well as PT, for likely LE weakness that may be contributing to his falls.  He wants to think about it and will let us know at his next follow-up visit.  -Force non-caffeinated/non-alcoholic fluids, as tolerated. I suspect some of his dizziness may be d/t orthostatic hypotension or dehydration. BP stable today. BUN/CRE also stable today. Encouraged him to follow-up with his PCP if his dizziness does not improve.   Skin lesions:  -Reportedly recently diagnosed with skin cancer to his right ear per patient.   -Continue follow-up with dermatology, as directed.       Dispo:  -Return to cancer center in 1 month for follow-up, labs, and next IVIG infusion.    All questions were answered to patient's stated satisfaction. Encouraged patient to call with any new concerns or questions before his next visit to the cancer center and we can certain see him sooner, if needed.    Plan of care discussed with Dr. Talbert Cage, who agrees with the above aforementioned.    Orders placed this encounter:  Orders Placed This Encounter  Procedures  . CBC with Differential/Platelet  . Comprehensive metabolic panel  . Immunofixation electrophoresis  . IgG, IgA, IgM  . Protein electrophoresis, serum  .  Kappa/lambda light chains  . Lactate dehydrogenase      Mike Craze, NP [place] [phone]

## 2017-04-15 NOTE — Progress Notes (Signed)
Jeremy Parrish presents today for injection per MD orders. B12 1000 mcg administered IM in right upper arm. Administration without incident. Patient tolerated well.  Patient tolerated infusion without adverse reaction Patient discharged from clinic ambulatory home to self.

## 2017-04-16 LAB — KAPPA/LAMBDA LIGHT CHAINS
KAPPA, LAMDA LIGHT CHAIN RATIO: 1.18 (ref 0.26–1.65)
Kappa free light chain: 38.3 mg/L — ABNORMAL HIGH (ref 3.3–19.4)
Lambda free light chains: 32.4 mg/L — ABNORMAL HIGH (ref 5.7–26.3)

## 2017-04-16 LAB — PROTEIN ELECTROPHORESIS, SERUM
A/G Ratio: 1.3 (ref 0.7–1.7)
ALPHA-2-GLOBULIN: 0.7 g/dL (ref 0.4–1.0)
Albumin ELP: 3.4 g/dL (ref 2.9–4.4)
Alpha-1-Globulin: 0.2 g/dL (ref 0.0–0.4)
Beta Globulin: 0.9 g/dL (ref 0.7–1.3)
GAMMA GLOBULIN: 0.9 g/dL (ref 0.4–1.8)
GLOBULIN, TOTAL: 2.7 g/dL (ref 2.2–3.9)
Total Protein ELP: 6.1 g/dL (ref 6.0–8.5)

## 2017-04-16 LAB — IGG, IGA, IGM
IGG (IMMUNOGLOBIN G), SERUM: 806 mg/dL (ref 700–1600)
IgA: 348 mg/dL (ref 61–437)
IgM, Serum: 27 mg/dL (ref 15–143)

## 2017-04-23 ENCOUNTER — Telehealth (HOSPITAL_COMMUNITY): Payer: Self-pay | Admitting: Oncology

## 2017-04-23 NOTE — Telephone Encounter (Signed)
[facility]  ID# [id number] 10/16/16-10/15/17 $10,000. GRANT

## 2017-04-24 ENCOUNTER — Other Ambulatory Visit (HOSPITAL_COMMUNITY): Payer: Self-pay | Admitting: Oncology

## 2017-04-24 DIAGNOSIS — C9001 Multiple myeloma in remission: Secondary | ICD-10-CM

## 2017-04-24 MED ORDER — LENALIDOMIDE 10 MG PO CAPS: ORAL_CAPSULE | ORAL | 0 refills | Status: DC

## 2017-05-01 ENCOUNTER — Encounter (INDEPENDENT_AMBULATORY_CARE_PROVIDER_SITE_OTHER): Payer: Self-pay | Admitting: Internal Medicine

## 2017-05-01 ENCOUNTER — Encounter (INDEPENDENT_AMBULATORY_CARE_PROVIDER_SITE_OTHER): Payer: Self-pay

## 2017-05-06 ENCOUNTER — Telehealth: Payer: Self-pay | Admitting: Family Medicine

## 2017-05-06 ENCOUNTER — Ambulatory Visit (INDEPENDENT_AMBULATORY_CARE_PROVIDER_SITE_OTHER): Payer: Medicare Other | Admitting: Family Medicine

## 2017-05-06 ENCOUNTER — Encounter: Payer: Self-pay | Admitting: Family Medicine

## 2017-05-06 VITALS — BP 122/68 | Ht 67.0 in | Wt 191.8 lb

## 2017-05-06 DIAGNOSIS — R531 Weakness: Secondary | ICD-10-CM | POA: Diagnosis not present

## 2017-05-06 DIAGNOSIS — R2681 Unsteadiness on feet: Secondary | ICD-10-CM

## 2017-05-06 DIAGNOSIS — I2510 Atherosclerotic heart disease of native coronary artery without angina pectoris: Secondary | ICD-10-CM | POA: Diagnosis not present

## 2017-05-06 DIAGNOSIS — E785 Hyperlipidemia, unspecified: Secondary | ICD-10-CM | POA: Diagnosis not present

## 2017-05-06 DIAGNOSIS — I10 Essential (primary) hypertension: Secondary | ICD-10-CM | POA: Diagnosis not present

## 2017-05-06 DIAGNOSIS — R7303 Prediabetes: Secondary | ICD-10-CM

## 2017-05-06 DIAGNOSIS — E876 Hypokalemia: Secondary | ICD-10-CM | POA: Diagnosis not present

## 2017-05-06 MED ORDER — OMEPRAZOLE 20 MG PO CPDR: DELAYED_RELEASE_CAPSULE | ORAL | 1 refills | Status: DC

## 2017-05-06 MED ORDER — ALBUTEROL SULFATE HFA 108 (90 BASE) MCG/ACT IN AERS: 2.0000 | INHALATION_SPRAY | Freq: Four times a day (QID) | RESPIRATORY_TRACT | 3 refills | Status: DC | PRN

## 2017-05-06 MED ORDER — KETOCONAZOLE 2 % EX CREA: TOPICAL_CREAM | CUTANEOUS | 2 refills | Status: DC

## 2017-05-06 MED ORDER — HYDROCODONE-ACETAMINOPHEN 5-325 MG PO TABS: 1.0000 | ORAL_TABLET | Freq: Four times a day (QID) | ORAL | 0 refills | Status: DC | PRN

## 2017-05-06 MED ORDER — AMLODIPINE BESYLATE 10 MG PO TABS: ORAL_TABLET | ORAL | 1 refills | Status: DC

## 2017-05-06 MED ORDER — CITALOPRAM HYDROBROMIDE 40 MG PO TABS: ORAL_TABLET | ORAL | 1 refills | Status: DC

## 2017-05-06 NOTE — Telephone Encounter (Signed)
Pt states pharmacy don't have his refills on the 3 meds that were ordered during [place] he needs 2 others  Pt needs:  Amlodipine, Citalopram, Omeprazole, Ketoconazole cream, & Albuterol Inhaler  [facility]   Please advise & call pt  [phone]

## 2017-05-06 NOTE — Telephone Encounter (Signed)
Spoke with patient and informed him that refills on requested medications [facility] [place]. Patient verbalized understanding.

## 2017-05-06 NOTE — Progress Notes (Signed)
   Subjective:    Patient ID: Jeremy Parrish, male    DOB: Jul 31, 1941, 76 y.o.   MRN: [id number]  Hypertension  This is a chronic problem. The current episode started more than 1 year ago. Risk factors for coronary artery disease include male gender, sedentary lifestyle and dyslipidemia. Treatments tried: norvasc. There are no compliance problems.    Patient having problems with balance and falling a lot, Spoke with his friends in physical therapy and would like to have another round of physical therapy  - Patient would also like refill of hydrocodone for rib pain since falling  Patient notes ongoing compliance with antidepressant medication. No obvious side effects. Reports does not miss a dose. Overall continues to help depression substantially. No thoughts of homicide or suicide. Would like to maintain medication.  Blood pressure medicine and blood pressure levels reviewed today with patient. Compliant with blood pressure medicine. States does not miss a dose. No obvious side effects. Blood pressure generally good when checked elsewhere. Watching salt intake.  Patient still experiencing right groin discomfort , vague hard to describe worse with certain motions concerned about it    Review of Systems No headache, no major weight loss or weight gain, no chest pain no back pain abdominal pain no change in bowel habits complete ROS otherwise negative     Objective:   Physical Exam  Alert and oriented, vitals reviewed and stable, NAD ENT-TM's and ext canals WNL bilat via otoscopic exam Soft palate, tonsils and post pharynx WNL via oropharyngeal exam Neck-symmetric, no masses; thyroid nonpalpable and nontender Pulmonary-no tachypnea or accessory muscle use; Clear without wheezes via auscultation Card--no abnrml murmurs, rhythm reg and rate WNL Carotid pulses symmetric, without bruits Gait a little unsteady particularly with turning or quick movements. Sufficient strength to stand out  of the chair without using arms.  Right groin no obvious masses no      Assessment & Plan:  Impression 1 hypertension good control discussed maintain same meds #2 depression clinically stable per patient will maintain same #3 groin discomfort patient reassured #4 gait abnormality physical therapy referral per patient request

## 2017-05-07 ENCOUNTER — Encounter: Payer: Self-pay | Admitting: Family Medicine

## 2017-05-15 ENCOUNTER — Other Ambulatory Visit (HOSPITAL_COMMUNITY): Payer: Self-pay | Admitting: Oncology

## 2017-05-15 ENCOUNTER — Encounter (HOSPITAL_COMMUNITY): Payer: Medicare Other | Attending: Adult Health

## 2017-05-15 ENCOUNTER — Encounter (HOSPITAL_BASED_OUTPATIENT_CLINIC_OR_DEPARTMENT_OTHER): Payer: Medicare Other

## 2017-05-15 ENCOUNTER — Encounter (HOSPITAL_BASED_OUTPATIENT_CLINIC_OR_DEPARTMENT_OTHER): Payer: Medicare Other | Admitting: Adult Health

## 2017-05-15 ENCOUNTER — Other Ambulatory Visit (HOSPITAL_COMMUNITY)
Admission: RE | Admit: 2017-05-15 | Discharge: 2017-05-15 | Disposition: A | Discharge status: Home / Self Care | Payer: Medicare Other | Source: Ambulatory Visit | Attending: Family Medicine | Admitting: Family Medicine

## 2017-05-15 ENCOUNTER — Encounter (HOSPITAL_COMMUNITY): Payer: Self-pay | Admitting: Adult Health

## 2017-05-15 VITALS — BP 147/80 | HR 60 | Temp 97.7°F | Resp 18 | Wt 192.0 lb

## 2017-05-15 DIAGNOSIS — I10 Essential (primary) hypertension: Secondary | ICD-10-CM | POA: Insufficient documentation

## 2017-05-15 DIAGNOSIS — I110 Hypertensive heart disease with heart failure: Secondary | ICD-10-CM | POA: Insufficient documentation

## 2017-05-15 DIAGNOSIS — D801 Nonfamilial hypogammaglobulinemia: Secondary | ICD-10-CM | POA: Diagnosis not present

## 2017-05-15 DIAGNOSIS — M62838 Other muscle spasm: Secondary | ICD-10-CM

## 2017-05-15 DIAGNOSIS — E785 Hyperlipidemia, unspecified: Secondary | ICD-10-CM | POA: Insufficient documentation

## 2017-05-15 DIAGNOSIS — Z8572 Personal history of non-Hodgkin lymphomas: Secondary | ICD-10-CM | POA: Diagnosis not present

## 2017-05-15 DIAGNOSIS — I2510 Atherosclerotic heart disease of native coronary artery without angina pectoris: Secondary | ICD-10-CM | POA: Diagnosis not present

## 2017-05-15 DIAGNOSIS — I252 Old myocardial infarction: Secondary | ICD-10-CM | POA: Insufficient documentation

## 2017-05-15 DIAGNOSIS — Z8546 Personal history of malignant neoplasm of prostate: Secondary | ICD-10-CM | POA: Insufficient documentation

## 2017-05-15 DIAGNOSIS — G629 Polyneuropathy, unspecified: Secondary | ICD-10-CM | POA: Diagnosis not present

## 2017-05-15 DIAGNOSIS — Z8052 Family history of malignant neoplasm of bladder: Secondary | ICD-10-CM | POA: Insufficient documentation

## 2017-05-15 DIAGNOSIS — Z8673 Personal history of transient ischemic attack (TIA), and cerebral infarction without residual deficits: Secondary | ICD-10-CM | POA: Insufficient documentation

## 2017-05-15 DIAGNOSIS — M25512 Pain in left shoulder: Secondary | ICD-10-CM | POA: Diagnosis not present

## 2017-05-15 DIAGNOSIS — Z800 Family history of malignant neoplasm of digestive organs: Secondary | ICD-10-CM | POA: Insufficient documentation

## 2017-05-15 DIAGNOSIS — C9001 Multiple myeloma in remission: Secondary | ICD-10-CM

## 2017-05-15 DIAGNOSIS — Z87442 Personal history of urinary calculi: Secondary | ICD-10-CM | POA: Diagnosis not present

## 2017-05-15 DIAGNOSIS — M791 Myalgia: Secondary | ICD-10-CM | POA: Diagnosis not present

## 2017-05-15 DIAGNOSIS — J449 Chronic obstructive pulmonary disease, unspecified: Secondary | ICD-10-CM | POA: Insufficient documentation

## 2017-05-15 DIAGNOSIS — N321 Vesicointestinal fistula: Secondary | ICD-10-CM | POA: Diagnosis not present

## 2017-05-15 DIAGNOSIS — F329 Major depressive disorder, single episode, unspecified: Secondary | ICD-10-CM | POA: Diagnosis not present

## 2017-05-15 DIAGNOSIS — I480 Paroxysmal atrial fibrillation: Secondary | ICD-10-CM | POA: Insufficient documentation

## 2017-05-15 DIAGNOSIS — Z8371 Family history of colonic polyps: Secondary | ICD-10-CM | POA: Insufficient documentation

## 2017-05-15 DIAGNOSIS — G4730 Sleep apnea, unspecified: Secondary | ICD-10-CM | POA: Diagnosis not present

## 2017-05-15 DIAGNOSIS — Z87891 Personal history of nicotine dependence: Secondary | ICD-10-CM | POA: Insufficient documentation

## 2017-05-15 DIAGNOSIS — Z9484 Stem cells transplant status: Secondary | ICD-10-CM

## 2017-05-15 DIAGNOSIS — R42 Dizziness and giddiness: Secondary | ICD-10-CM | POA: Diagnosis not present

## 2017-05-15 DIAGNOSIS — M2550 Pain in unspecified joint: Secondary | ICD-10-CM

## 2017-05-15 DIAGNOSIS — I739 Peripheral vascular disease, unspecified: Secondary | ICD-10-CM | POA: Insufficient documentation

## 2017-05-15 DIAGNOSIS — Z8679 Personal history of other diseases of the circulatory system: Secondary | ICD-10-CM | POA: Diagnosis not present

## 2017-05-15 DIAGNOSIS — E538 Deficiency of other specified B group vitamins: Secondary | ICD-10-CM | POA: Insufficient documentation

## 2017-05-15 DIAGNOSIS — Z9889 Other specified postprocedural states: Secondary | ICD-10-CM | POA: Insufficient documentation

## 2017-05-15 DIAGNOSIS — Z79899 Other long term (current) drug therapy: Secondary | ICD-10-CM | POA: Insufficient documentation

## 2017-05-15 DIAGNOSIS — N320 Bladder-neck obstruction: Secondary | ICD-10-CM | POA: Diagnosis not present

## 2017-05-15 DIAGNOSIS — I509 Heart failure, unspecified: Secondary | ICD-10-CM | POA: Insufficient documentation

## 2017-05-15 DIAGNOSIS — Z888 Allergy status to other drugs, medicaments and biological substances status: Secondary | ICD-10-CM | POA: Insufficient documentation

## 2017-05-15 DIAGNOSIS — Z8551 Personal history of malignant neoplasm of bladder: Secondary | ICD-10-CM | POA: Diagnosis not present

## 2017-05-15 DIAGNOSIS — K219 Gastro-esophageal reflux disease without esophagitis: Secondary | ICD-10-CM | POA: Diagnosis not present

## 2017-05-15 DIAGNOSIS — Z8249 Family history of ischemic heart disease and other diseases of the circulatory system: Secondary | ICD-10-CM | POA: Insufficient documentation

## 2017-05-15 LAB — LIPID PANEL
Cholesterol: 102 mg/dL (ref 0–200)
HDL: 33 mg/dL — ABNORMAL LOW (ref >40)
LDL CALC: 47 mg/dL (ref 0–99)
Total CHOL/HDL Ratio: 3.1 RATIO
Triglycerides: 108 mg/dL (ref <150)
VLDL: 22 mg/dL (ref 0–40)

## 2017-05-15 LAB — CBC WITH DIFFERENTIAL/PLATELET
Basophils Absolute: 0 10*3/uL (ref 0.0–0.1)
Basophils Relative: 1 %
Eosinophils Absolute: 0.2 10*3/uL (ref 0.0–0.7)
Eosinophils Relative: 6 %
HEMATOCRIT: 38.1 % — AB (ref 39.0–52.0)
Hemoglobin: 12.2 g/dL — ABNORMAL LOW (ref 13.0–17.0)
LYMPHS ABS: 1.3 10*3/uL (ref 0.7–4.0)
LYMPHS PCT: 39 %
MCH: 28.8 pg (ref 26.0–34.0)
MCHC: 32 g/dL (ref 30.0–36.0)
MCV: 89.9 fL (ref 78.0–100.0)
MONO ABS: 0.3 10*3/uL (ref 0.1–1.0)
MONOS PCT: 9 %
NEUTROS ABS: 1.5 10*3/uL — AB (ref 1.7–7.7)
Neutrophils Relative %: 45 %
Platelets: 105 10*3/uL — ABNORMAL LOW (ref 150–400)
RBC: 4.24 MIL/uL (ref 4.22–5.81)
RDW: 15.9 % — AB (ref 11.5–15.5)
WBC: 3.3 10*3/uL — ABNORMAL LOW (ref 4.0–10.5)

## 2017-05-15 LAB — HEPATIC FUNCTION PANEL
ALK PHOS: 84 U/L (ref 38–126)
ALT: 13 U/L — AB (ref 17–63)
AST: 20 U/L (ref 15–41)
Albumin: 3.9 g/dL (ref 3.5–5.0)
BILIRUBIN DIRECT: 0.1 mg/dL (ref 0.1–0.5)
Indirect Bilirubin: 0.7 mg/dL (ref 0.3–0.9)
TOTAL PROTEIN: 7.2 g/dL (ref 6.5–8.1)
Total Bilirubin: 0.8 mg/dL (ref 0.3–1.2)

## 2017-05-15 LAB — COMPREHENSIVE METABOLIC PANEL
ALT: 14 U/L — ABNORMAL LOW (ref 17–63)
ANION GAP: 7 (ref 5–15)
AST: 20 U/L (ref 15–41)
Albumin: 3.8 g/dL (ref 3.5–5.0)
Alkaline Phosphatase: 85 U/L (ref 38–126)
BILIRUBIN TOTAL: 0.7 mg/dL (ref 0.3–1.2)
BUN: 13 mg/dL (ref 6–20)
CALCIUM: 9.6 mg/dL (ref 8.9–10.3)
CO2: 26 mmol/L (ref 22–32)
Chloride: 103 mmol/L (ref 101–111)
Creatinine, Ser: 0.9 mg/dL (ref 0.61–1.24)
GFR calc Af Amer: >60 mL/min (ref >60)
Glucose, Bld: 115 mg/dL — ABNORMAL HIGH (ref 65–99)
POTASSIUM: 4.3 mmol/L (ref 3.5–5.1)
Sodium: 136 mmol/L (ref 135–145)
TOTAL PROTEIN: 7.1 g/dL (ref 6.5–8.1)

## 2017-05-15 LAB — LACTATE DEHYDROGENASE: LDH: 129 U/L (ref 98–192)

## 2017-05-15 MED ORDER — DEXTROSE 5 % IV SOLN
INTRAVENOUS | Status: DC
  Administered 2017-05-15: 10:00:00 via INTRAVENOUS

## 2017-05-15 MED ORDER — ACETAMINOPHEN 325 MG PO TABS
ORAL_TABLET | ORAL | Status: AC
  Filled 2017-05-15: qty 2

## 2017-05-15 MED ORDER — SODIUM CHLORIDE 0.9 % IV SOLN: Freq: Once | INTRAVENOUS | Status: DC

## 2017-05-15 MED ORDER — SODIUM CHLORIDE 0.9 % IJ SOLN
10.0000 mL | INTRAMUSCULAR | Status: DC | PRN
  Administered 2017-05-15 (×2): 10 mL
  Filled 2017-05-15 (×2): qty 10

## 2017-05-15 MED ORDER — CYANOCOBALAMIN 1000 MCG/ML IJ SOLN
1000.0000 ug | Freq: Once | INTRAMUSCULAR | Status: AC
  Administered 2017-05-15: 1000 ug via INTRAMUSCULAR

## 2017-05-15 MED ORDER — IMMUNE GLOBULIN (HUMAN) 10 GM/100ML IV SOLN
400.0000 mg/kg | Freq: Once | INTRAVENOUS | Status: AC
  Administered 2017-05-15: 35 g via INTRAVENOUS
  Filled 2017-05-15: qty 200

## 2017-05-15 MED ORDER — CYANOCOBALAMIN 1000 MCG/ML IJ SOLN
INTRAMUSCULAR | Status: AC
  Filled 2017-05-15: qty 1

## 2017-05-15 MED ORDER — ACETAMINOPHEN 325 MG PO TABS
650.0000 mg | ORAL_TABLET | Freq: Once | ORAL | Status: AC
  Administered 2017-05-15: 650 mg via ORAL

## 2017-05-15 MED ORDER — HEPARIN SOD (PORK) LOCK FLUSH 100 UNIT/ML IV SOLN
500.0000 [IU] | Freq: Once | INTRAVENOUS | Status: AC | PRN
  Administered 2017-05-15: 500 [IU]

## 2017-05-15 MED ORDER — DICLOFENAC SODIUM 1 % TD GEL: 2.0000 g | Freq: Four times a day (QID) | TRANSDERMAL | 3 refills | Status: DC | PRN

## 2017-05-15 NOTE — Progress Notes (Signed)
Patient tolerated infusion today without incidence. Patient discharged ambulatory and in stable condition from clinic to self. Patient to follow up as scheduled.   

## 2017-05-15 NOTE — Patient Instructions (Signed)
[place] at [facility] Discharge Instructions  RECOMMENDATIONS MADE BY THE CONSULTANT AND ANY TEST RESULTS WILL BE SENT TO YOUR REFERRING PHYSICIAN.  You received your IVIG today. You also received your B-12 shot Follow up in one month as scheduled.  Thank you for choosing [place] at [facility] to provide your oncology and hematology care.  To afford each patient quality time with our provider, please arrive at least 15 minutes before your scheduled appointment time.    If you have a lab appointment with the [facility] please come in thru the  Main Entrance and check in at the main information desk  You need to re-schedule your appointment should you arrive 10 or more minutes late.  We strive to give you quality time with our providers, and arriving late affects you and other patients whose appointments are after yours.  Also, if you no show three or more times for appointments you may be dismissed from the clinic at the providers discretion.     Again, thank you for choosing [facility].  Our hope is that these requests will decrease the amount of time that you wait before being seen by our physicians.       _____________________________________________________________  Should you have questions after your visit to [facility], please contact our office at [phone] between the hours of 8:30 a.m. and 4:30 p.m.  Voicemails left after 4:30 p.m. will not be returned until the following business day.  For prescription refill requests, have your pharmacy contact our office.       Resources For Cancer Patients and their Caregivers ? American Cancer Society: Can assist with transportation, wigs, general needs, runs Look Good Feel Better.        431-363-3608 ? Cancer Care: Provides financial assistance, online support groups, medication/co-pay assistance.  1-800-813-HOPE 416 351 9573) ? [place] Assists [place] cancer patients and their families through emotional , educational and financial support.  905-835-7698 ? [place] DSS Where to apply for food stamps, Medicaid and utility assistance. 539 888 5432 ? [employer]: Transportation to medical appointments. 253-148-7803 ? Social Security Administration: May apply for disability if have a Stage IV cancer. (769)819-3998 (438)479-4334 ? [employer], Disability and Transit Services: Assists with nutrition, care and transit needs. [place] Support Programs: @10RELATIVEDAYS @ > Cancer Support Group  2nd Tuesday of the month 1pm-2pm, Journey Room  > Creative Journey  3rd Tuesday of the month 1130am-1pm, Journey Room  > Look Good Feel Better  1st Wednesday of the month 10am-12 noon, Journey Room (Call [place] to register [phone])

## 2017-05-15 NOTE — Patient Instructions (Addendum)
[place] at [facility] Discharge Instructions  RECOMMENDATIONS MADE BY THE CONSULTANT AND ANY TEST RESULTS WILL BE SENT TO YOUR REFERRING PHYSICIAN.  You were seen today by Mike Craze NP. Continue monthly injection and infusions. Return in 2 months for follow up.   Thank you for choosing [place] at [facility] to provide your oncology and hematology care.  To afford each patient quality time with our provider, please arrive at least 15 minutes before your scheduled appointment time.    If you have a lab appointment with the [facility] please come in thru the  Main Entrance and check in at the main information desk  You need to re-schedule your appointment should you arrive 10 or more minutes late.  We strive to give you quality time with our providers, and arriving late affects you and other patients whose appointments are after yours.  Also, if you no show three or more times for appointments you may be dismissed from the clinic at the providers discretion.     Again, thank you for choosing [facility].  Our hope is that these requests will decrease the amount of time that you wait before being seen by our physicians.       _____________________________________________________________  Should you have questions after your visit to [facility], please contact our office at [phone] between the hours of 8:30 a.m. and 4:30 p.m.  Voicemails left after 4:30 p.m. will not be returned until the following business day.  For prescription refill requests, have your pharmacy contact our office.       Resources For Cancer Patients and their Caregivers ? American Cancer Society: Can assist with transportation, wigs, general needs, runs Look Good Feel Better.        305-185-8663 ? Cancer Care: Provides financial assistance, online support groups, medication/co-pay assistance.  1-800-813-HOPE  (202) 347-6071) ? [place] Assists [place] cancer patients and their families through emotional , educational and financial support.  9185360023 ? [place] DSS Where to apply for food stamps, Medicaid and utility assistance. 9868609380 ? [employer]: Transportation to medical appointments. 870-376-9865 ? Social Security Administration: May apply for disability if have a Stage IV cancer. 2126207086 747-507-1374 ? [employer], Disability and Transit Services: Assists with nutrition, care and transit needs. [place] Support Programs: @10RELATIVEDAYS @ > Cancer Support Group  2nd Tuesday of the month 1pm-2pm, Journey Room  > Creative Journey  3rd Tuesday of the month 1130am-1pm, Journey Room  > Look Good Feel Better  1st Wednesday of the month 10am-12 noon, Journey Room (Call [place] to register [phone])

## 2017-05-15 NOTE — Progress Notes (Signed)
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 Brostrom 75 y.o. male returns for follow-up for history of multiple myeloma and lymphoma, as well as hypogammaglobulinemia.   Overall, he has been feeling "pretty good."  Appetite and energy levels are both 75%.  Many of the symptoms he reports have been chronic and are largely unchanged including: fatigue, shortness of breath, diarrhea, weakness, night sweats, dizziness, peripheral neuropathy, and easy bleeding/bruising.    Night sweats occur 1-2 times/week and generally do not require pajama or linen change. Denies any fever or chills; denies any recent infections.  His weight remains stable.  Reports that he has pain "all over"; he has tried Tylenol which did not help very much.  He used some of his daughter-in-law's Voltaren gel and this did help his pain; he is requesting a prescription for Voltaren gel today.    His lower extremity weakness and decreased shoulder ROM/pain was addressed with his PCP. He tells me that he is set up to see OT/PT starting next week for therapy.    Continues on Revlimid with good tolerance.  He takes it as prescribed.    He is here today for his next dose of IVIG and vitamin B12 injection.      REVIEW OF SYSTEMS:  Review of Systems  Constitutional: Positive for fatigue.  Eyes: Negative.   Respiratory: Positive for shortness of breath. Negative for  cough.   Cardiovascular: Positive for leg swelling.  Gastrointestinal: Positive for diarrhea.  Endocrine: Negative.   Genitourinary: Negative.  Negative for dysuria and hematuria.   Musculoskeletal: Positive for arthralgias.  Skin: Negative for rash.  Neurological: Positive for dizziness, extremity weakness and numbness.  Hematological: Bruises/bleeds easily.  Psychiatric/Behavioral: Negative.      PAST MEDICAL/SURGICAL HISTORY:  Past Medical History:  Diagnosis Date  . Anemia   . Aortic aneurysm of unspecified site without mention of rupture   . Arthritis   . Bladder neck contracture   . Cancer ([place])   . Cerebral atherosclerosis    Carotid Doppler, 02/16/2013 - Bilateral Proximal ICAs,demonstrate mild plaque w/o evidence of significant diameter reduction, dissection, or any other vascular abnormality  . CHF (congestive heart failure) ([place])   . Complication of anesthesia   . COPD (chronic obstructive pulmonary disease) ([place])   . Coronary artery disease   . Depression   . Esophageal reflux   . Heart disease   . Heart murmur   . Hx of bladder cancer 10/07/2012  . Hyperlipidemia   . Hypertension   . Hypogammaglobulinemia ([place]) 09/28/2012   Secondary to Lymphoma and Multiple Myeloma and their treatments  . Intestinovesical fistula   . Kidney stones    history  . Lung mass   . Multiple myeloma   . Myocardial infarction [facility])    '96  . Non Hodgkin's lymphoma ([place])   . Paroxysmal atrial fibrillation ([place]) 01/02/2016  . Peripheral arterial disease ([place])   .  Personal history of other diseases of circulatory system   . PONV (postoperative nausea and vomiting)   . Prostate cancer ([place]) 2000  . Shingles   . Shortness of breath   . Sleep apnea    05-02-14 cpap , not yet used- suggested settings 5  . Stroke [facility]) 2013   Speech.   Past Surgical History:  Procedure Laterality Date  . BLADDER SURGERY    . BONE MARROW TRANSPLANT  2011  . COLON SURGERY     colon resection  .  COLONOSCOPY N/A 01/01/2013   Procedure: COLONOSCOPY;  Surgeon: Rogene Houston, MD;  Location: [facility] ENDO SUITE;  Service: Endoscopy;  Laterality: N/A;  825-moved to [place] notified pt  . CORONARY ANGIOPLASTY  06/24/2000   PCI and stenting in mid & proximal RCA  . heart stents x 5  1999  . INGUINAL HERNIA REPAIR Right 05/04/2014   Procedure: OPEN RIGHT INGUINAL HERNIA REPAIR with mesh;  Surgeon: Edward Jolly, MD;  Location: [facility] ORS;  Service: General;  Laterality: Right;  . INSERT / REPLACE / REMOVE PACEMAKER    . left ear skin cancer removed    . NM MYOCAR PERF WALL MOTION  11/27/2007   inferior scar  . PACEMAKER INSERTION  07/22/2011   [employer]  . PORTACATH PLACEMENT  07/26/2009   right chest  . PROSTATE SURGERY    . Rotator    . ROTATOR CUFF REPAIR Right   . SHOULDER ARTHROSCOPY WITH SUBACROMIAL DECOMPRESSION Right 07/21/2013   Procedure: RIGHT SHOULDER ARTHROSCOPY WITH SUBACROMIAL DECOMPRESSION AND DEBRIDEMENT & Injection of Left Shoulder;  Surgeon: Alta Corning, MD;  Location: [place];  Service: Orthopedics;  Laterality: Right;  . TEE WITHOUT CARDIOVERSION  10/13/2012   Procedure: TRANSESOPHAGEAL ECHOCARDIOGRAM (TEE);  Surgeon: Sanda Klein, MD;  Location: [facility] ENDOSCOPY;  Service: Cardiovascular;  Laterality: N/A;  pat/kay/echo notified  . US ECHOCARDIOGRAPHY  06/19/2011   RV mildly dilated,mild to mod. MR,mild AI,mild PI  . WRIST SURGERY     right     SOCIAL HISTORY:  Social History   Social History  . Marital status: Married    Spouse name: [name]  . Number of children: 3  . Years of education: 9th   Occupational History  . retired     Social History Main Topics  . Smoking status: Former Smoker    Packs/day: 1.00    Years: 20.00    Types: Cigarettes    Quit date: 11/14/1994  . Smokeless tobacco: Never Used  . Alcohol use No     Comment: previously drank but none for at least 15 years.  . Drug use: No  . Sexual activity: Not on file   Other Topics Concern  . Not  on file   Social History Narrative   Patient lives at home spouse.   Caffeine Use: Occasionally    FAMILY HISTORY:  Family History  Problem Relation Age of Onset  . Cancer Father        bladder  . Heart disease Father        before age 6  . Hypertension Mother   . Cancer Brother   . Heart disease Brother        before age 81  . Heart disease Sister        before age 71  . Hyperlipidemia Sister   . Hypertension Sister   . Heart attack Sister   . Colon cancer Neg Hx   . Colon polyps Neg Hx  CURRENT MEDICATIONS:  Outpatient Encounter Prescriptions as of 05/15/2017  Medication Sig Note  . albuterol (PROVENTIL HFA;VENTOLIN HFA) 108 (90 Base) MCG/ACT inhaler Inhale 2 puffs into the lungs every 6 (six) hours as needed for wheezing or shortness of breath.   Marland Kitchen albuterol (PROVENTIL) (2.5 MG/3ML) 0.083% nebulizer solution Use via neb q 4 hours prn wheezing (Patient taking differently: Take 2.5 mg by nebulization every 4 (four) hours as needed for wheezing or shortness of breath. )   . ALPRAZolam (XANAX) 0.5 MG tablet Take 1-2 tablets (0.5-1 mg total) by mouth at bedtime as needed for anxiety.   Marland Kitchen amLODipine (NORVASC) 10 MG tablet TAKE 1 TABLET (10 MG TOTAL) BY MOUTH EVERY MORNING.   . budesonide (PULMICORT) 0.5 MG/2ML nebulizer solution Take 2 mLs (0.5 mg total) by nebulization 2 (two) times daily. (Patient taking differently: Take 0.5 mg by nebulization daily as needed (for shortness of breath). )   . chlorzoxazone (PARAFON) 500 MG tablet Take 1 tablet (500 mg total) by mouth 3 (three) times daily as needed for muscle spasms.   . citalopram (CELEXA) 40 MG tablet TAKE 1 TABLET (40 MG TOTAL) BY MOUTH DAILY.   Marland Kitchen clopidogrel (PLAVIX) 75 MG tablet TAKE 1 TABLET BY MOUTH EVERY DAY   . ELIQUIS 5 MG TABS tablet TAKE 1 TABLET BY MOUTH TWICE A DAY   . furosemide (LASIX) 20 MG tablet USE SPARINGLY AS NEEDED FOR EDEMA NO MORE THAN ONE PER WEEK   . HYDROcodone-acetaminophen (NORCO/VICODIN) 5-325  MG tablet Take 1 tablet by mouth every 6 (six) hours as needed.   Marland Kitchen ketoconazole (NIZORAL) 2 % cream APPLY 1 APPLICATION TOPICALLY 2 (TWO) TIMES DAILY.   Marland Kitchen KLOR-CON M20 20 MEQ tablet Take 1 tablet (20 mEq total) by mouth 3 (three) times daily. Reported on 11/15/2015   . lenalidomide (REVLIMID) 10 MG capsule TAKE 1 CAPSULE BY MOUTH DAILY FOR 7 DAYS ON FOLLOWED BY 7 DAYS OFF. THEN REPEAT CYCLE.   Marland Kitchen levocetirizine (XYZAL) 5 MG tablet Take 5 mg by mouth daily as needed for allergies.    Marland Kitchen LYRICA 75 MG capsule  07/04/2016: Received from: External Pharmacy  . MYRBETRIQ 25 MG TB24 tablet    . nitroGLYCERIN (NITROSTAT) 0.4 MG SL tablet Place 1 tablet (0.4 mg total) under the tongue every 5 (five) minutes as needed for chest pain.   Marland Kitchen omeprazole (PRILOSEC) 20 MG capsule TAKE 1 CAPSULE (20 MG TOTAL) BY MOUTH DAILY.   Marland Kitchen potassium chloride SA (K-DUR,KLOR-CON) 20 MEQ tablet Take 1 tablet (20 mEq total) by mouth 3 (three) times daily.   . simvastatin (ZOCOR) 20 MG tablet TAKE 1 TABLET (20 MG TOTAL) BY MOUTH AT BEDTIME.   Marland Kitchen diclofenac sodium (VOLTAREN) 1 % GEL Apply 2 g topically 4 (four) times daily as needed.    Facility-Administered Encounter Medications as of 05/15/2017  Medication  . acetaminophen (TYLENOL) tablet 650 mg  . sodium chloride 0.9 % injection 10 mL    ALLERGIES:  Allergies  Allergen Reactions  . Morphine And Related Other (See Comments)    hallucinations  . Tape Rash    Paper tape is ok     PHYSICAL EXAM:  ECOG Performance status: 1-2 - Symptomatic; requires occasional assistance, but is largely independent.      Physical Exam  Constitutional: He is oriented to person, place, and time and well-developed, well-nourished, and in no distress.  HENT:  Head: Normocephalic.  Mouth/Throat: Oropharynx is clear and moist.  (R) ear helix deformity  Eyes: Conjunctivae are normal. No scleral icterus.  Neck: Normal range of motion. Neck supple.  Cardiovascular: Normal rate and regular  rhythm.   Pulmonary/Chest: Effort normal. He has wheezes (mild expiratory wheezes noted). He has no rales.  Abdominal: Soft. Bowel sounds are normal. There is no tenderness. There is no rebound and no guarding.  Musculoskeletal: He exhibits edema (Trace LE edema).  LE weakness bilat Decreased shoulder ROM bilat and decreased arm strength as well   Lymphadenopathy:    He has no cervical adenopathy.  Neurological: He is alert and oriented to person, place, and time.  Stuttered/garbled speech at times  Skin: Skin is warm and dry. No rash noted.  Psychiatric: Mood, memory, affect and judgment normal.  Nursing note and vitals reviewed.    LABORATORY DATA:  I have reviewed the labs as listed.  CBC    Component Value Date/Time   WBC 3.3 (L) 05/15/2017 0846   RBC 4.24 05/15/2017 0846   HGB 12.2 (L) 05/15/2017 0846   HCT 38.1 (L) 05/15/2017 0846   PLT 105 (L) 05/15/2017 0846   MCV 89.9 05/15/2017 0846   MCH 28.8 05/15/2017 0846   MCHC 32.0 05/15/2017 0846   RDW 15.9 (H) 05/15/2017 0846   LYMPHSABS 1.3 05/15/2017 0846   MONOABS 0.3 05/15/2017 0846   EOSABS 0.2 05/15/2017 0846   BASOSABS 0.0 05/15/2017 0846   CMP Latest Ref Rng & Units 05/15/2017 05/15/2017 04/15/2017  Glucose 65 - 99 mg/dL - 115(H) 116(H)  BUN 6 - 20 mg/dL - 13 11  Creatinine 0.61 - 1.24 mg/dL - 0.90 0.96  Sodium 135 - 145 mmol/L - 136 137  Potassium 3.5 - 5.1 mmol/L - 4.3 3.7  Chloride 101 - 111 mmol/L - 103 106  CO2 22 - 32 mmol/L - 26 24  Calcium 8.9 - 10.3 mg/dL - 9.6 9.0  Total Protein 6.5 - 8.1 g/dL 7.2 7.1 6.6  Total Bilirubin 0.3 - 1.2 mg/dL 0.8 0.7 0.5  Alkaline Phos 38 - 126 U/L 84 85 67  AST 15 - 41 U/L _0 ALT 17 - 63 U/L 13(L) 14(L) 15(L)   Results for XAINE, SANSOM (MRN [id number])   Ref. Range 04/15/2017 08:36  Total Protein ELP Latest Ref Range: 6.0 - 8.5 g/dL 6.1  Albumin ELP Latest Ref Range: 2.9 - 4.4 g/dL 3.4  Globulin, Total Latest Ref Range: 2.2 - 3.9 g/dL 2.7  A/G Ratio Latest Ref  Range: 0.7 - 1.7  1.3  Alpha-1-Globulin Latest Ref Range: 0.0 - 0.4 g/dL 0.2  Alpha-2-Globulin Latest Ref Range: 0.4 - 1.0 g/dL 0.7  Beta Globulin Latest Ref Range: 0.7 - 1.3 g/dL 0.9  Gamma Globulin Latest Ref Range: 0.4 - 1.8 g/dL 0.9  M-SPIKE, % Latest Ref Range: Not Observed g/dL Not Observed  Results for ALBEN, JEPSEN (MRN [id number])  Ref. Range 04/15/2017 08:35  IgG (Immunoglobin G), Serum Latest Ref Range: 700 - 1,600 mg/dL 806  IgA Latest Ref Range: 61 - 437 mg/dL 348  IgM, Serum Latest Ref Range: 15 - 143 mg/dL 27  Kappa free light chain Latest Ref Range: 3.3 - 19.4 mg/L 38.3 (H)  Lamda free light chains Latest Ref Range: 5.7 - 26.3 mg/L 32.4 (H)  Kappa, lamda light chain ratio Latest Ref Range: 0.26 - 1.65  1.18     PENDING LABS:  Myeloma labs pending   DIAGNOSTIC IMAGING:    PATHOLOGY:     ASSESSMENT & PLAN:   IgG lambda multiple myeloma  s/p stem cell transplant: -s/p stem cell transplant at [facility] in 08/2010. Recommendations from [facility] to continue maintenance Revlimid beyond the typical 2 years of maintenance therapy.  Continues to see transplant team annually.  -Tolerating Revlimid well; continue as directed.  M-spike has remained undetectable, as expected.  Myeloma labs are pending for today. We will contact him with the results when they are available.  -Return to cancer center for follow-up in 2 months with repeat myeloma labs.   Marginal zone lymphoma: -Diagnosed in 05/2006 after lung biopsy; s/p R-CHOP chemotherapy x 6 cycles. Has been without evidence of disease since that time.  -Periodic night sweats sound more like his sleeping environment being too warm than true night sweats.  No lymphadenopathy on physical exam. No reported fever/chills. Weight is stable.   Hypogammaglobulinemia: -IVIG every 4 weeks for history of recurrent infections.  -Due for infusion today. Will proceed as scheduled.  -Return monthly for IVIG.    Vitamin B12 deficiency:  -Continue monthly B12 injections. He will get injection today as scheduled.  Arthralgias/Myalgias/Decreased ROM and LE weakness:  -Taking Chlorzoxazone PRN.  -E-scribed Voltaren 1% gel to his pharmacy.  There are no reported contraindications for Voltaren with IVIG or Revlimid. -He has been referred to outpatient OT/PT, which is excellent.  I am hopeful he will have some clinical improvement in his symptoms with OT/PT.     Dizziness/Gait instability:  -Worse with position changes (from going to seated to standing position). Reports that he does drink a lot of Mountain Dew soda during the day "and I probably don't drink enough water."  Recommended he force non-caffeinated fluids as tolerated, as I suspect some of his dizziness may be d/t orthostatic hypotension/dehydration.  BP stable and kidney function remains stable as well.  -Encouraged him to maintain upcoming OT/PT appointments.  I hope this will provide for gait stability and help improve his leg weakness and decreased arm/shoulder ROM.          Dispo:  -Continue monthly IVIG and vitamin B12 injections.  -Return to cancer center in 2 months for follow-up visit with labs, infusion, and injection.     All questions were answered to patient's stated satisfaction. Encouraged patient to call with any new concerns or questions before his next visit to the cancer center and we can certain see him sooner, if needed.    Plan of care discussed with Dr. Talbert Cage, who agrees with the above aforementioned.    Orders placed this encounter:  Orders Placed This Encounter  Procedures  . CBC with Differential/Platelet  . Comprehensive metabolic panel  . Immunofixation electrophoresis  . IgG, IgA, IgM  . Kappa/lambda light chains  . Lactate dehydrogenase  . Protein electrophoresis, serum      Mike Craze, NP [place] [phone]

## 2017-05-15 NOTE — Progress Notes (Signed)
Jeremy Parrish presents today for injection per MD orders. B12 1000 mcg administered IM in right deltoid. Administration without incident. Patient tolerated well.

## 2017-05-16 LAB — PROTEIN ELECTROPHORESIS, SERUM
A/G RATIO SPE: 1.1 (ref 0.7–1.7)
ALPHA-1-GLOBULIN: 0.3 g/dL (ref 0.0–0.4)
ALPHA-2-GLOBULIN: 0.7 g/dL (ref 0.4–1.0)
Albumin ELP: 3.6 g/dL (ref 2.9–4.4)
BETA GLOBULIN: 1.1 g/dL (ref 0.7–1.3)
Gamma Globulin: 1.1 g/dL (ref 0.4–1.8)
Globulin, Total: 3.2 g/dL (ref 2.2–3.9)
Total Protein ELP: 6.8 g/dL (ref 6.0–8.5)

## 2017-05-16 LAB — IMMUNOFIXATION ELECTROPHORESIS
IGA: 362 mg/dL (ref 61–437)
IGM, SERUM: 28 mg/dL (ref 15–143)
IgG (Immunoglobin G), Serum: 894 mg/dL (ref 700–1600)
Total Protein ELP: 6.6 g/dL (ref 6.0–8.5)

## 2017-05-16 LAB — IGG, IGA, IGM
IGA: 386 mg/dL (ref 61–437)
IgG (Immunoglobin G), Serum: 953 mg/dL (ref 700–1600)
IgM, Serum: 30 mg/dL (ref 15–143)

## 2017-05-16 LAB — KAPPA/LAMBDA LIGHT CHAINS
KAPPA, LAMDA LIGHT CHAIN RATIO: 1.22 (ref 0.26–1.65)
Kappa free light chain: 39.5 mg/L — ABNORMAL HIGH (ref 3.3–19.4)
Lambda free light chains: 32.3 mg/L — ABNORMAL HIGH (ref 5.7–26.3)

## 2017-05-19 ENCOUNTER — Ambulatory Visit (HOSPITAL_COMMUNITY): Payer: Medicare Other | Attending: Family Medicine

## 2017-05-19 ENCOUNTER — Encounter (HOSPITAL_COMMUNITY): Payer: Self-pay

## 2017-05-19 DIAGNOSIS — R262 Difficulty in walking, not elsewhere classified: Secondary | ICD-10-CM | POA: Insufficient documentation

## 2017-05-19 DIAGNOSIS — M6281 Muscle weakness (generalized): Secondary | ICD-10-CM | POA: Insufficient documentation

## 2017-05-19 DIAGNOSIS — R29898 Other symptoms and signs involving the musculoskeletal system: Secondary | ICD-10-CM | POA: Diagnosis not present

## 2017-05-19 DIAGNOSIS — R2681 Unsteadiness on feet: Secondary | ICD-10-CM | POA: Insufficient documentation

## 2017-05-19 NOTE — Therapy (Signed)
[place], [place], [address] Phone: [phone]   Fax:  [phone]  Physical Therapy Evaluation  Patient Details  Name: Jeremy Parrish MRN: [id number] Date of Birth: [birth year] Referring Provider: Baltazar Apo, MD  Encounter Date: 05/19/2017      PT End of Session - 05/19/17 1222    Visit Number 1   Number of Visits 9   Date for PT Re-Evaluation 06/16/17   Authorization Type Medicare Part A and B (Secondary: BCBS Supplemental)   Authorization Time Period 05/19/17 to 06/16/17   Authorization - Visit Number 1   Authorization - Number of Visits 10   PT Start Time 0901   PT Stop Time 0946   PT Time Calculation (min) 45 min   Equipment Utilized During Treatment Gait belt   Activity Tolerance Patient tolerated treatment well;Patient limited by fatigue   Behavior During Therapy [facility] for tasks assessed/performed      Past Medical History:  Diagnosis Date  . Anemia   . Aortic aneurysm of unspecified site without mention of rupture   . Arthritis   . Bladder neck contracture   . Cancer ([place])   . Cerebral atherosclerosis    Carotid Doppler, 02/16/2013 - Bilateral Proximal ICAs,demonstrate mild plaque w/o evidence of significant diameter reduction, dissection, or any other vascular abnormality  . CHF (congestive heart failure) ([place])   . Complication of anesthesia   . COPD (chronic obstructive pulmonary disease) ([place])   . Coronary artery disease   . Depression   . Esophageal reflux   . Heart disease   . Heart murmur   . Hx of bladder cancer 10/07/2012  . Hyperlipidemia   . Hypertension   . 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 Intestinovesical fistula   . Kidney stones    history  . Lung mass   . Multiple myeloma   . Myocardial infarction [facility])    '96  . Non Hodgkin's lymphoma ([place])   . Paroxysmal atrial fibrillation ([place]) 01/02/2016  .  Peripheral arterial disease ([place])   . Personal history of other diseases of circulatory system   . PONV (postoperative nausea and vomiting)   . Prostate cancer ([place]) 2000  . Shingles   . Shortness of breath   . Sleep apnea    05-02-14 cpap , not yet used- suggested settings 5  . Stroke [facility]) 2013   Speech.    Past Surgical History:  Procedure Laterality Date  . BLADDER SURGERY    . BONE MARROW TRANSPLANT  2011  . COLON SURGERY     colon resection  . COLONOSCOPY N/A 01/01/2013   Procedure: COLONOSCOPY;  Surgeon: Rogene Houston, MD;  Location: [facility];  Service: Endoscopy;  Laterality: N/A;  825-moved to [place] notified pt  . CORONARY ANGIOPLASTY  06/24/2000   PCI and stenting in mid & proximal RCA  . heart stents x 5  1999  . INGUINAL HERNIA REPAIR Right 05/04/2014   Procedure: OPEN RIGHT INGUINAL HERNIA REPAIR with mesh;  Surgeon: Edward Jolly, MD;  Location: [facility] ORS;  Service: General;  Laterality: Right;  . INSERT / REPLACE / REMOVE PACEMAKER    . left ear skin cancer removed    . NM MYOCAR PERF WALL MOTION  11/27/2007   inferior scar  . PACEMAKER INSERTION  07/22/2011   Medtronic  . PORTACATH PLACEMENT  07/26/2009   right chest  .  PROSTATE SURGERY    . Rotator    . ROTATOR CUFF REPAIR Right   . SHOULDER ARTHROSCOPY WITH SUBACROMIAL DECOMPRESSION Right 07/21/2013   Procedure: RIGHT SHOULDER ARTHROSCOPY WITH SUBACROMIAL DECOMPRESSION AND DEBRIDEMENT & Injection of Left Shoulder;  Surgeon: Alta Corning, MD;  Location: [place];  Service: Orthopedics;  Laterality: Right;  . TEE WITHOUT CARDIOVERSION  10/13/2012   Procedure: TRANSESOPHAGEAL ECHOCARDIOGRAM (TEE);  Surgeon: Sanda Klein, MD;  Location: [facility] ENDOSCOPY;  Service: Cardiovascular;  Laterality: N/A;  pat/kay/echo notified  . US ECHOCARDIOGRAPHY  06/19/2011   RV mildly dilated,mild to mod. MR,mild AI,mild PI  . WRIST SURGERY     right    There were no vitals filed for this visit.       Subjective Assessment  - 05/19/17 0905    Subjective Pt states that his legs have progressively gotten weaker over the last year. He reports having 4 falls in the last 6 months. He states he fell when he tripped over his feet. The first fall he tripped over his shoe strings, fell and broke his jaw. He reports his last fall happened about 4-5 weeks ago when he turned around fast and fell; he is unsure of if he got dizzy or what because it happened so fast. 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 He states that walking and balance are his main issues which is what brings him to [facility] for therapy. He states that it takes him a while to get going. He denies feeling dizzy when he rolls over in bed, just light headed when he first stands up. He also reports just feeling fatgiued all the time.   Pertinent History COPD, CVA in 2013 (speech deficits)   Limitations Walking;House hold activities   How long can you stand comfortably? about 5 mins   How long can you walk comfortably? 5 mins   Patient Stated Goals get to feeling better, more strength   Currently in Pain? No/denies            [facility] PT Assessment - 05/19/17 0001      Assessment   Medical Diagnosis weakness, unstable gait   Referring Provider Baltazar Apo, MD   Onset Date/Surgical Date --  progressively worsened over the last year   Prior Therapy none for present issue     Precautions   Precautions Fall     Balance Screen   Has the patient fallen in the past 6 months Yes   How many times? 4   Has the patient had a decrease in activity level because of a fear of falling?  Yes   Is the patient reluctant to leave their home because of a fear of falling?  No     Observation/Other Assessments   Focus on Therapeutic Outcomes (FOTO)  49% limited     ROM / Strength   AROM / PROM / Strength Strength     Strength   Right Hip Flexion 5/5   Right Hip Extension 4/5   Right Hip ABduction 4+/5   Left Hip Flexion 5/5   Left Hip  Extension 4/5   Left Hip ABduction 4+/5   Right Knee Flexion 5/5   Right Knee Extension 5/5   Left Knee Flexion 5/5   Left Knee Extension 5/5   Right Ankle Dorsiflexion 5/5   Left Ankle Dorsiflexion 5/5     Ambulation/Gait   Ambulation/Gait Yes   Ambulation Distance (Feet) 904 Feet  6MWT  Assistive device None   Gait Pattern Step-through pattern;Decreased dorsiflexion - left;Trendelenburg  decreased push-off and decreased hip ext BLE     Balance   Balance Assessed Yes     Static Standing Balance   Static Standing - Balance Support No upper extremity supported   Static Standing Balance -  Activities  Single Leg Stance - Right Leg;Single Leg Stance - Left Leg   Static Standing - Comment/# of Minutes R: 8 sec L: 6 sec     Standardized Balance Assessment   Standardized Balance Assessment Five Times Sit to Stand;Dynamic Gait Index   Five times sit to stand comments  10.26 sec from chair, no UE support     Dynamic Gait Index   Level Surface Normal   Change in Gait Speed Normal   Gait with Horizontal Head Turns Mild Impairment   Gait with Vertical Head Turns Mild Impairment   Gait and Pivot Turn Normal   Step Over Obstacle Normal   Step Around Obstacles Normal   Steps Mild Impairment   Total Score 21         Objective measurements completed on examination: See above findings.           PT Short Term Goals - 05/19/17 1227      PT SHORT TERM GOAL #1   Title Pt will be independent and consistently perform HEP to maximize return to PLOF.   Time 2   Period Weeks   Status New     PT SHORT TERM GOAL #2   Title Pt will have improved BLE strength to 5/5 throughout in order to maximize his gait and decrease risk for falls.   Time 2   Period Weeks   Status New     PT SHORT TERM GOAL #3   Title Pt will report being able to was dishes for 20 mins or longer to demonstrate improved balance, endurance, and improved tolerance to standing to maximize function at home.    Time 2   Period Weeks   Status New           PT Long Term Goals - 05/19/17 1229      PT LONG TERM GOAL #1   Title Pt will have improved 6MWT to at least 1232f with no rest breaks during the test to demonstrate improved functional strength and overall endurance in order to maximize community participation.   Time 4   Period Weeks   Status New     PT LONG TERM GOAL #2   Title Pt will have improved SLS to at least 15 seconds on BLE to demonstrate improved balance and maximize gait on uneven ground.   Time 2   Period Weeks   Status New     PT LONG TERM GOAL #3   Title Pt will report participating in a regular walking program at least 3x/week to demonstrate improved enduarance and to maximize return to PLOF.   Time 4   Period Weeks   Status New     PT LONG TERM GOAL #4   Title Pt will have improved BERG balance score to at least 52/56 to demonstrate improved overall balance and decrease risk for falls.   Time 4   Period Weeks   Status New                Plan - 05/19/17 1223    Clinical Impression Statement Pt is pleasant [id number] who presents to OPPT with c/o progressive BLE weakness and  deficits in balance, gait, and endurance. Pt states that he has had 4 falls in last 6 months, mostly due to tripping over his feet. Examination reveals some deficits in MMT as his proximal hip musculature was 4 to 4+/5. Pt scored 21/24 on DGI indicating minimal to no risks for falls, but his SLS was <10 sec BLE and his functional strength is deficient as demo by his gait pattern. Pt also required 2 rest breaks during 6MWT and his gait deficits increased as his fatigue increased. Pt would benefit from skilled PT intervention to address functional weakness, gait, balance, and endurance deficits in order to maximize participation at home and in the community.   History and Personal Factors relevant to plan of care: h/o cancer (presently in remission but still taking meds 7 days on, 7 days off),  COPD, CVA (residual speech deficits, no residual LE or UE deficits), motivated to participate with PT   Clinical Presentation Stable   Clinical Presentation due to: MMT, functional strength, gait deviations, endurance, balance   Clinical Decision Making Low   Rehab Potential Good   PT Frequency 2x / week   PT Duration 4 weeks   PT Treatment/Interventions ADLs/Self Care Home Management;DME Instruction;Gait training;Stair training;Functional mobility training;Therapeutic activities;Therapeutic exercise;Balance training;Neuromuscular re-education;Patient/family education;Manual techniques;Passive range of motion;Energy conservation   PT Next Visit Plan Review goals, BERG, check orthostatic BP, check visual fields, review HEP, initiate functional strengthening and perform sustained gait training to work on endurance   PT Home Exercise Plan 7/9: bridging, STS   Consulted and Agree with Plan of Care Patient      Patient will benefit from skilled therapeutic intervention in order to improve the following deficits and impairments:  Abnormal gait, Cardiopulmonary status limiting activity, Decreased activity tolerance, Decreased balance, Decreased endurance, Decreased strength, Difficulty walking  Visit Diagnosis: Difficulty in walking, not elsewhere classified - Plan: PT plan of care cert/re-cert  Unsteadiness on feet - Plan: PT plan of care cert/re-cert  Muscle weakness (generalized) - Plan: PT plan of care cert/re-cert  Other symptoms and signs involving the musculoskeletal system - Plan: PT plan of care cert/re-cert      G-Codes - 00/93/81 1233    Functional Assessment Tool Used (Outpatient Only) clinical judgement, 3MWT, SLS   Functional Limitation Mobility: Walking and moving around   Mobility: Walking and Moving Around Current Status (W2993) At least 40 percent but less than 60 percent impaired, limited or restricted   Mobility: Walking and Moving Around Goal Status (Z1696) At least 1  percent but less than 20 percent impaired, limited or restricted       Problem List Patient Active Problem List   Diagnosis Date Noted  . Long term current use of anticoagulant 12/25/2016  . COPD (chronic obstructive pulmonary disease) ([place]) 04/18/2016  . Paroxysmal atrial fibrillation ([place]) 01/02/2016  . Hypokalemia 11/03/2014  . URI (upper respiratory infection) 11/03/2014  . Weakness 11/03/2014  . Obstructive sleep apnea 08/07/2014  . Central sleep apnea 08/07/2014  . Inguinal hernia 05/04/2014  . Chest pain with moderate risk for cardiac etiology 11/25/2013  . Fever 09/13/2013  . Pneumonia 09/13/2013  . Pancytopenia ([place]) 09/13/2013  . Muscle weakness (generalized) 08/04/2013  . Status post arthroscopy of shoulder 08/04/2013  . Pain in joint, shoulder region 08/04/2013  . Rotator cuff tear arthropathy of right shoulder 07/21/2013  . Left rotator cuff tear arthropathy 07/21/2013  . Shortness of breath 07/07/2013  . Aneurysm of iliac artery ([place]) 01/26/2013  . Hyperlipidemia with target LDL  less than 100 10/08/2012  . Prediabetes 10/08/2012  . Expressive aphasia 10/08/2012  . Hemiplegia affecting right dominant side ([place]) 10/08/2012  . 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 Hx of bladder cancer 10/07/2012  . 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 ASTHMA, UNSPECIFIED 03/22/2010  . Nonspecific (abnormal) findings on radiological and other examination of body structure 03/22/2010  . Hx of lymphoma 03/21/2010  . Multiple myeloma in remission ([place]) 03/21/2010  . Anxiety state 03/21/2010  . Essential hypertension 03/21/2010  . MYOCARDIAL INFARCTION 03/21/2010  . Coronary atherosclerosis 03/21/2010  . NEPHROLITHIASIS 03/21/2010  . ELEVATED PROSTATE SPECIFIC ANTIGEN 03/21/2010  . ROTATOR CUFF REPAIR, RIGHT, HX OF 03/21/2010     Geraldine Solar PT, DPT  [place] [address] [place], [place], [address] Phone: [phone]   Fax:  [phone]  Name: ARJUNA DOEDEN MRN: [id number] Date of Birth: [birth year]

## 2017-05-21 ENCOUNTER — Ambulatory Visit (HOSPITAL_COMMUNITY): Payer: Medicare Other | Admitting: Physical Therapy

## 2017-05-21 DIAGNOSIS — R2681 Unsteadiness on feet: Secondary | ICD-10-CM

## 2017-05-21 DIAGNOSIS — M6281 Muscle weakness (generalized): Secondary | ICD-10-CM | POA: Diagnosis not present

## 2017-05-21 DIAGNOSIS — R29898 Other symptoms and signs involving the musculoskeletal system: Secondary | ICD-10-CM | POA: Diagnosis not present

## 2017-05-21 DIAGNOSIS — R262 Difficulty in walking, not elsewhere classified: Secondary | ICD-10-CM

## 2017-05-21 NOTE — Therapy (Signed)
[place], [place], [address] Phone: [phone]   Fax:  [phone]  Physical Therapy Treatment  Patient Details  Name: Jeremy Parrish MRN: [id number] Date of Birth: 06/16/1941 Referring Provider: Baltazar Apo, MD  Encounter Date: 05/21/2017      PT End of Session - 05/21/17 1137    Visit Number 2   Number of Visits 9   Date for PT Re-Evaluation 06/16/17   Authorization Type Medicare Part A and B (Secondary: BCBS Supplemental)   Authorization Time Period 05/19/17 to 06/16/17   Authorization - Visit Number 2   Authorization - Number of Visits 10   PT Start Time 1034   PT Stop Time 1125   PT Time Calculation (min) 51 min   Equipment Utilized During Treatment Gait belt   Activity Tolerance Patient tolerated treatment well;Patient limited by fatigue   Behavior During Therapy [facility] for tasks assessed/performed      Past Medical History:  Diagnosis Date  . Anemia   . Aortic aneurysm of unspecified site without mention of rupture   . Arthritis   . Bladder neck contracture   . Cancer ([place])   . Cerebral atherosclerosis    Carotid Doppler, 02/16/2013 - Bilateral Proximal ICAs,demonstrate mild plaque w/o evidence of significant diameter reduction, dissection, or any other vascular abnormality  . CHF (congestive heart failure) ([place])   . Complication of anesthesia   . COPD (chronic obstructive pulmonary disease) ([place])   . Coronary artery disease   . Depression   . Esophageal reflux   . Heart disease   . Heart murmur   . Hx of bladder cancer 10/07/2012  . Hyperlipidemia   . Hypertension   . 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 Intestinovesical fistula   . Kidney stones    history  . Lung mass   . Multiple myeloma   . Myocardial infarction [facility])    '96  . Non Hodgkin's lymphoma ([place])   . Paroxysmal atrial fibrillation ([place]) 01/02/2016  .  Peripheral arterial disease ([place])   . Personal history of other diseases of circulatory system   . PONV (postoperative nausea and vomiting)   . Prostate cancer ([place]) 2000  . Shingles   . Shortness of breath   . Sleep apnea    05-02-14 cpap , not yet used- suggested settings 5  . Stroke [facility]) 2013   Speech.    Past Surgical History:  Procedure Laterality Date  . BLADDER SURGERY    . BONE MARROW TRANSPLANT  2011  . COLON SURGERY     colon resection  . COLONOSCOPY N/A 01/01/2013   Procedure: COLONOSCOPY;  Surgeon: Rogene Houston, MD;  Location: [facility] ENDO [facility]ITE;  Service: Endoscopy;  Laterality: N/A;  825-moved to [place] notified pt  . CORONARY ANGIOPLASTY  06/24/2000   PCI and stenting in mid & proximal RCA  . heart stents x 5  1999  . INGUINAL HERNIA REPAIR Right 05/04/2014   Procedure: OPEN RIGHT INGUINAL HERNIA REPAIR with mesh;  Surgeon: Edward Jolly, MD;  Location: [facility] ORS;  Service: General;  Laterality: Right;  . INSERT / REPLACE / REMOVE PACEMAKER    . left ear skin cancer removed    . NM MYOCAR PERF WALL MOTION  11/27/2007   inferior scar  . PACEMAKER INSERTION  07/22/2011   Medtronic  . PORTACATH PLACEMENT  07/26/2009   right chest  .  PROSTATE SURGERY    . Rotator    . ROTATOR CUFF REPAIR Right   . SHOULDER ARTHROSCOPY WITH SUBACROMIAL DECOMPRESSION Right 07/21/2013   Procedure: RIGHT SHOULDER ARTHROSCOPY WITH SUBACROMIAL DECOMPRESSION AND DEBRIDEMENT & Injection of Left Shoulder;  Surgeon: Alta Corning, MD;  Location: [facility];  Service: Orthopedics;  Laterality: Right;  . TEE WITHOUT CARDIOVERSION  10/13/2012   Procedure: TRANSESOPHAGEAL ECHOCARDIOGRAM (TEE);  Surgeon: Sanda Klein, MD;  Location: [facility] ENDOSCOPY;  Service: Cardiovascular;  Laterality: N/A;  pat/kay/echo notified  . US ECHOCARDIOGRAPHY  06/19/2011   RV mildly dilated,mild to mod. MR,mild AI,mild PI  . WRIST SURGERY     right    There were no vitals filed for this visit.      Subjective Assessment -  05/21/17 1132    Subjective Pt reports compliance with HEP, however states the bridge exercise is giving him cramps in his legs.  currently without pain.   Currently in Pain? No/denies            [facility] PT Assessment - 05/21/17 0001      Assessment   Medical Diagnosis weakness, unstable gait     Observation/Other Assessments   Observations orthostatic BP:  supine:145/80, sitting: 139/75 standing: 132/77     Berg Balance Test   Sit to Stand Able to stand without using hands and stabilize independently   Standing Unsupported Able to stand safely 2 minutes   Sitting with Back Unsupported but Feet Supported on Floor or Stool Able to sit safely and securely 2 minutes   Stand to Sit Sits safely with minimal use of hands   Transfers Able to transfer safely, minor use of hands   Standing Unsupported with Eyes Closed Able to stand 10 seconds safely   Standing Ubsupported with Feet Together Able to place feet together independently and stand 1 minute safely   From Standing, Reach Forward with Outstretched Arm Can reach forward >12 cm safely (5")   From Standing Position, Pick up Object from Floor Able to pick up shoe safely and easily   From Standing Position, Turn to Look Behind Over each Shoulder Turn sideways only but maintains balance   Turn 360 Degrees Able to turn 360 degrees safely but slowly   Standing Unsupported, Alternately Place Feet on Step/Stool Able to stand independently and safely and complete 8 steps in 20 seconds   Standing Unsupported, One Foot in Front Needs help to step but can hold 15 seconds   Standing on One Leg Tries to lift leg/unable to hold 3 seconds but remains standing independently   Total Score 45                     [facility] Adult PT Treatment/Exercise - 05/21/17 0001      Knee/Hip Exercises: Aerobic   Nustep 8 minutes level 2 hills 2 LE's only (not billable)     Knee/Hip Exercises: Standing   SLS 2" max bilaterally   Other Standing Knee  Exercises tandem stance 30" each     Knee/Hip Exercises: Seated   Sit to Sand 10 reps;without UE support     Knee/Hip Exercises: Supine   Bridges 10 reps   Bridges Limitations glute squeeze wtihout active movment     Knee/Hip Exercises: Sidelying   Hip ABduction Both;10 reps     Knee/Hip Exercises: Prone   Hip Extension Both;10 reps                PT Education - 05/21/17  54    Education provided Yes   Education Details reviewed intial evaluation including goals.  Modified bridge to complete isometrically and reviewed additional exercise given for HEP.   Person(s) Educated Patient   Methods Explanation;Demonstration;Tactile cues;Verbal cues;Handout   Comprehension Verbalized understanding;Returned demonstration;Verbal cues required;Tactile cues required          PT Short Term Goals - 05/19/17 1227      PT SHORT TERM GOAL #1   Title Pt will be independent and consistently perform HEP to maximize return to PLOF.   Time 2   Period Weeks   Status New     PT SHORT TERM GOAL #2   Title Pt will have improved BLE strength to 5/5 throughout in order to maximize his gait and decrease risk for falls.   Time 2   Period Weeks   Status New     PT SHORT TERM GOAL #3   Title Pt will report being able to was dishes for 20 mins or longer to demonstrate improved balance, endurance, and improved tolerance to standing to maximize function at home.   Time 2   Period Weeks   Status New           PT Long Term Goals - 05/19/17 1229      PT LONG TERM GOAL #1   Title Pt will have improved 6MWT to at least 1257f with no rest breaks during the test to demonstrate improved functional strength and overall endurance in order to maximize community participation.   Time 4   Period Weeks   Status New     PT LONG TERM GOAL #2   Title Pt will have improved SLS to at least 15 seconds on BLE to demonstrate improved balance and maximize gait on uneven ground.   Time 2   Period Weeks    Status New     PT LONG TERM GOAL #3   Title Pt will report participating in a regular walking program at least 3x/week to demonstrate improved enduarance and to maximize return to PLOF.   Time 4   Period Weeks   Status New     PT LONG TERM GOAL #4   Title Pt will have improved BERG balance score to at least 52/56 to demonstrate improved overall balance and decrease risk for falls.   Time 4   Period Weeks   Status New               Plan - 05/21/17 1137    Clinical Impression Statement Reviewed HEP and goals wtih patient for therapy.  pt with c/o cramping in LE's with bridge so modified to isometric glute contractions with improved tolerance. Completed orthostatic BP readings (in assessment) and reviewed with evaluating therapist.  Little change noted with these measures.  pt did complain of mild dizziness when transitioning to sitting from supine, but no major issue.  BERG completed this session with score of 45/56 resulting.  Added hip strengthening exercises in sidelying and prone but did not add to HEP.  Will need functional strength and balance progression next session.    Rehab Potential Good   PT Frequency 2x / week   PT Duration 4 weeks   PT Treatment/Interventions ADLs/Self Care Home Management;DME Instruction;Gait training;Stair training;Functional mobility training;Therapeutic activities;Therapeutic exercise;Balance training;Neuromuscular re-education;Patient/family education;Manual techniques;Passive range of motion;Energy conservation   PT Next Visit Plan Next session check visual fields.  Initiate functional strengthening and perform sustained gait training to work on endurance   PT Home  Exercise Plan 7/9: bridging, STS   Consulted and Agree with Plan of Care Patient      Patient will benefit from skilled therapeutic intervention in order to improve the following deficits and impairments:  Abnormal gait, Cardiopulmonary status limiting activity, Decreased activity  tolerance, Decreased balance, Decreased endurance, Decreased strength, Difficulty walking  Visit Diagnosis: Difficulty in walking, not elsewhere classified  Unsteadiness on feet  Muscle weakness (generalized)  Other symptoms and signs involving the musculoskeletal system     Problem List Patient Active Problem List   Diagnosis Date Noted  . Long term current use of anticoagulant 12/25/2016  . COPD (chronic obstructive pulmonary disease) ([place]) 04/18/2016  . Paroxysmal atrial fibrillation ([place]) 01/02/2016  . Hypokalemia 11/03/2014  . URI (upper respiratory infection) 11/03/2014  . Weakness 11/03/2014  . Obstructive sleep apnea 08/07/2014  . Central sleep apnea 08/07/2014  . Inguinal hernia 05/04/2014  . Chest pain with moderate risk for cardiac etiology 11/25/2013  . Fever 09/13/2013  . Pneumonia 09/13/2013  . Pancytopenia ([place]) 09/13/2013  . Muscle weakness (generalized) 08/04/2013  . Status post arthroscopy of shoulder 08/04/2013  . Pain in joint, shoulder region 08/04/2013  . Rotator cuff tear arthropathy of right shoulder 07/21/2013  . Left rotator cuff tear arthropathy 07/21/2013  . Shortness of breath 07/07/2013  . 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 Hyperlipidemia with target LDL less than 100 10/08/2012  . Prediabetes 10/08/2012  . Expressive aphasia 10/08/2012  . Hemiplegia affecting right dominant side ([place]) 10/08/2012  . 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 Hx of bladder cancer 10/07/2012  . 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 ASTHMA, UNSPECIFIED 03/22/2010  . Nonspecific (abnormal) findings on radiological and other examination of body structure 03/22/2010  . Hx of lymphoma 03/21/2010  . Multiple myeloma in remission ([place]) 03/21/2010  . Anxiety state 03/21/2010  . Essential hypertension 03/21/2010  .  MYOCARDIAL INFARCTION 03/21/2010  . Coronary atherosclerosis 03/21/2010  . NEPHROLITHIASIS 03/21/2010  . ELEVATED PROSTATE SPECIFIC ANTIGEN 03/21/2010  . ROTATOR CUFF REPAIR, RIGHT, HX OF 03/21/2010   Teena Irani, PTA/CLT [phone]  Teena Irani 05/21/2017, 11:43 AM  [facility] [address] [place], [place], [address] Phone: [phone]   Fax:  [phone]  Name: DENZIL MCEACHRON MRN: [id number] Date of Birth: [birth year]

## 2017-05-23 ENCOUNTER — Encounter (INDEPENDENT_AMBULATORY_CARE_PROVIDER_SITE_OTHER): Payer: Self-pay | Admitting: Internal Medicine

## 2017-05-23 ENCOUNTER — Ambulatory Visit (INDEPENDENT_AMBULATORY_CARE_PROVIDER_SITE_OTHER): Payer: Medicare Other | Admitting: Internal Medicine

## 2017-05-23 VITALS — BP 124/76 | HR 98 | Temp 97.6°F | Ht 68.0 in | Wt 192.5 lb

## 2017-05-23 DIAGNOSIS — I2510 Atherosclerotic heart disease of native coronary artery without angina pectoris: Secondary | ICD-10-CM | POA: Diagnosis not present

## 2017-05-23 DIAGNOSIS — K529 Noninfective gastroenteritis and colitis, unspecified: Secondary | ICD-10-CM | POA: Diagnosis not present

## 2017-05-23 NOTE — Progress Notes (Signed)
   Subjective:    Patient ID: Jeremy Parrish, male    DOB: Jun 04, 1941, 76 y.o.   MRN: [id number]  HPI Here today for f/u.   Last seen in July of 2017. Hx of chronic diarrhea, diverticulitis.   In the past, GI pathogen negative.  His appetite is good. He has gained 3 pounds since his last viisit. Usually has a BM daily. No melena or BRRB. States his stools are formed. Has included fiber in his diet. He exercises at the gym and [facility]     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 Marcell Anger. 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 In 2011 he was diagnosed with IgG lambda multiple myeloma and treated with 4 cycles of Velcade and dexamethasone followed by autologous peripheral blood stem cell transplant and currently remains on maintenance Revlimid and  takes seven days on and 7 days off for his myeloma.   01/01/2013 Colonoscopy with snare polypectomy.  Indications: Patient is 77 year old Caucasian male with multiple medical problems who presents with chronic diarrhea. He has history of complicated sigmoid diverticulitis for which he had surgery in December 2007.  Impression:  Examination performed to cecum. Three small polyps snared from cecum and submitted together. Two small polyps were coagulated as above. Single diverticulum at cecum with few more at descending colon. Open colonic anastomosis at 20 cm from the anal margin. No evidence of endoscopic colitis.     Review of Systems     Objective:   Physical Exam Blood pressure 124/76, pulse 98, temperature 97.6 F (36.4 C), height _0  (1.727 m), weight 192 lb 8 oz (87.3 kg).  Alert and oriented. Skin  warm and dry. Oral mucosa is moist.   . Sclera anicteric, conjunctivae is pink. Thyroid not enlarged. No cervical lymphadenopathy. Lungs clear. Heart regular rate and rhythm.  Abdomen is soft. Bowel sounds are positive. No hepatomegaly. No abdominal masses felt. No tenderness.  No edema to lower extremities. .       Assessment & Plan:  Chronic diarrhea. He is doing much better. Has no GI complaints.

## 2017-05-23 NOTE — Patient Instructions (Signed)
OV in 1 year.  

## 2017-05-25 ENCOUNTER — Encounter: Payer: Self-pay | Admitting: Family Medicine

## 2017-05-27 ENCOUNTER — Ambulatory Visit (HOSPITAL_COMMUNITY): Payer: Medicare Other | Admitting: Physical Therapy

## 2017-05-27 DIAGNOSIS — R29898 Other symptoms and signs involving the musculoskeletal system: Secondary | ICD-10-CM | POA: Diagnosis not present

## 2017-05-27 DIAGNOSIS — M6281 Muscle weakness (generalized): Secondary | ICD-10-CM | POA: Diagnosis not present

## 2017-05-27 DIAGNOSIS — H2513 Age-related nuclear cataract, bilateral: Secondary | ICD-10-CM | POA: Diagnosis not present

## 2017-05-27 DIAGNOSIS — H35372 Puckering of macula, left eye: Secondary | ICD-10-CM | POA: Diagnosis not present

## 2017-05-27 DIAGNOSIS — R2681 Unsteadiness on feet: Secondary | ICD-10-CM

## 2017-05-27 DIAGNOSIS — H43811 Vitreous degeneration, right eye: Secondary | ICD-10-CM | POA: Diagnosis not present

## 2017-05-27 DIAGNOSIS — H18413 Arcus senilis, bilateral: Secondary | ICD-10-CM | POA: Diagnosis not present

## 2017-05-27 DIAGNOSIS — R262 Difficulty in walking, not elsewhere classified: Secondary | ICD-10-CM

## 2017-05-27 NOTE — Therapy (Signed)
[place], [place], [address] Phone: [phone]   Fax:  [phone]  Physical Therapy Treatment  Patient Details  Name: Jeremy Parrish MRN: [id number] Date of Birth: Oct 11, 1941 Referring Provider: Baltazar Apo, MD  Encounter Date: 05/27/2017      PT End of Session - 05/27/17 1340    Visit Number 3   Number of Visits 9   Date for PT Re-Evaluation 06/16/17   Authorization Type Medicare Part A and B (Secondary: BCBS Supplemental)   Authorization Time Period 05/19/17 to 06/16/17   Authorization - Visit Number 3   Authorization - Number of Visits 10   PT Start Time 1301   PT Stop Time 1350   PT Time Calculation (min) 49 min   Equipment Utilized During Treatment Gait belt   Activity Tolerance Patient tolerated treatment well;Patient limited by fatigue   Behavior During Therapy [facility] for tasks assessed/performed      Past Medical History:  Diagnosis Date  . Anemia   . Aortic aneurysm of unspecified site without mention of rupture   . Arthritis   . Bladder neck contracture   . Cancer ([place])   . Cerebral atherosclerosis    Carotid Doppler, 02/16/2013 - Bilateral Proximal ICAs,demonstrate mild plaque w/o evidence of significant diameter reduction, dissection, or any other vascular abnormality  . CHF (congestive heart failure) ([place])   . Complication of anesthesia   . COPD (chronic obstructive pulmonary disease) ([place])   . Coronary artery disease   . Depression   . Esophageal reflux   . Heart disease   . Heart murmur   . Hx of bladder cancer 10/07/2012  . Hyperlipidemia   . Hypertension   . 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 Intestinovesical fistula   . Kidney stones    history  . Lung mass   . Multiple myeloma   . Myocardial infarction [facility])    '96  . Non Hodgkin's lymphoma ([place])   . Paroxysmal atrial fibrillation ([place]) 01/02/2016  .  Peripheral arterial disease ([place])   . Personal history of other diseases of circulatory system   . PONV (postoperative nausea and vomiting)   . Prostate cancer ([place]) 2000  . Shingles   . Shortness of breath   . Sleep apnea    05-02-14 cpap , not yet used- suggested settings 5  . Stroke [facility]) 2013   Speech.    Past Surgical History:  Procedure Laterality Date  . BLADDER SURGERY    . BONE MARROW TRANSPLANT  2011  . COLON SURGERY     colon resection  . COLONOSCOPY N/A 01/01/2013   Procedure: COLONOSCOPY;  Surgeon: Rogene Houston, MD;  [facility];  Service: Endoscopy;  Laterality: N/A;  825-moved to [place] notified pt  . CORONARY ANGIOPLASTY  06/24/2000   PCI and stenting in mid & proximal RCA  . heart stents x 5  1999  . INGUINAL HERNIA REPAIR Right 05/04/2014   Procedure: OPEN RIGHT INGUINAL HERNIA REPAIR with mesh;  Surgeon: Edward Jolly, MD;  Location: [facility];  Service: General;  Laterality: Right;  . INSERT / REPLACE / REMOVE PACEMAKER    . left ear skin cancer removed    . NM MYOCAR PERF WALL MOTION  11/27/2007   inferior scar  . PACEMAKER INSERTION  07/22/2011   [employer]tr[employer]  . PORTACATH PLACEMENT  07/26/2009   right chest  .  PROSTATE SURGERY    . Rotator    . ROTATOR CUFF REPAIR Right   . SHOULDER ARTHROSCOPY WITH SUBACROMIAL DECOMPRESSION Right 07/21/2013   Procedure: RIGHT SHOULDER ARTHROSCOPY WITH SUBACROMIAL DECOMPRESSION AND DEBRIDEMENT & Injection of Left Shoulder;  Surgeon: [place] Corning, MD;  Location: [place];  Service: Orthopedics;  Laterality: Right;  . TEE WITHOUT CARDIOVERSION  10/13/2012   Procedure: TRANSESOPHAGEAL ECHOCARDIOGRAM (TEE);  Surgeon: Sanda Klein, MD;  Location: [facility] ENDOSCOPY;  Service: Cardiovascular;  Laterality: N/A;  pat/kay/echo notified  . US ECHOCARDIOGRAPHY  06/19/2011   RV mildly dilated,mild to mod. MR,mild AI,mild PI  . WRIST SURGERY     right    There were no vitals filed for this visit.      Subjective Assessment -  05/27/17 1305    Subjective Pt reports that things are going well. He continues to work on his exercises.   Currently in Pain? No/denies                         [facility] Adult PT Treatment/Exercise - 05/27/17 0001      Knee/Hip Exercises: Aerobic   Nustep x10 min L1, SpO2 monitored during activity, remaining above 94%.      Knee/Hip Exercises: Standing   Heel Raises Both;3 sets;10 reps   Heel Raises Limitations toe raises 2x15 reps from incline    Forward Step Up Both;1 set;Hand Hold: 0;Step Height: 6";10 reps   Forward Step Up Limitations contralateral knee drive    Wall Squat 2 sets;15 reps             Balance Exercises - 05/27/17 1328      Balance Exercises: Standing   Standing Eyes Opened Narrow base of support (BOS);Other (comment)  trunk rotation Lt/Rt holding weighted blue ball, on foam    Tandem Stance 2 reps;20 secs;Eyes open  x2 reps on foam.   SLS Eyes open;2 reps;20 secs  ~5-7 sec each            PT Education - 05/27/17 1340    Education provided Yes   Education Details technique with therex   Person(s) Educated Patient   Methods Explanation;Verbal cues;Tactile cues   Comprehension Verbalized understanding;Returned demonstration          PT Short Term Goals - 05/19/17 1227      PT SHORT TERM GOAL #1   Title Pt will be independent and consistently perform HEP to maximize return to PLOF.   Time 2   Period Weeks   Status New     PT SHORT TERM GOAL #2   Title Pt will have improved BLE strength to 5/5 throughout in order to maximize his gait and decrease risk for falls.   Time 2   Period Weeks   Status New     PT SHORT TERM GOAL #3   Title Pt will report being able to was dishes for 20 mins or longer to demonstrate improved balance, endurance, and improved tolerance to standing to maximize function at home.   Time 2   Period Weeks   Status New           PT Long Term Goals - 05/19/17 1229      PT LONG TERM GOAL #1    Title Pt will have improved 6MWT to at least 1266f with no rest breaks during the test to demonstrate improved functional strength and overall endurance in order to maximize community participation.   Time 4  Period Weeks   Status New     PT LONG TERM GOAL #2   Title Pt will have improved SLS to at least 15 seconds on BLE to demonstrate improved balance and maximize gait on uneven ground.   Time 2   Period Weeks   Status New     PT LONG TERM GOAL #3   Title Pt will report participating in a regular walking program at least 3x/week to demonstrate improved enduarance and to maximize return to PLOF.   Time 4   Period Weeks   Status New     PT LONG TERM GOAL #4   Title Pt will have improved BERG balance score to at least 52/56 to demonstrate improved overall balance and decrease risk for falls.   Time 4   Period Weeks   Status New               Plan - 05/27/17 1343    Clinical Impression Statement Pt arrived today with minimal complaints and continued HEP adherence. Session focused initially on functional strengthening and exercises to improve ankle endurance. Pt requiring intermittent rest breaks due to fatigue throughout the session. Ended with balance activity, noting pt able to maintain single leg balance on each LE for atleast 5 sec without LOB, which appears to be an improvement from last session. Will continue with current POC.   Rehab Potential Good   PT Frequency 2x / week   PT Duration 4 weeks   PT Treatment/Interventions ADLs/Self Care Home Management;DME Instruction;Gait training;Stair training;Functional mobility training;Therapeutic activities;Therapeutic exercise;Balance training;Neuromuscular re-education;Patient/family education;Manual techniques;Passive range of motion;Energy conservation   PT Next Visit Plan Next session check visual fields. ankle strengthening, static balance unsteady surfaces; dynamic balance rotation/etc.   PT Home Exercise Plan 7/9:  bridging, STS   Consulted and Agree with Plan of Care Patient      Patient will benefit from skilled therapeutic intervention in order to improve the following deficits and impairments:  Abnormal gait, Cardiopulmonary status limiting activity, Decreased activity tolerance, Decreased balance, Decreased endurance, Decreased strength, Difficulty walking  Visit Diagnosis: Difficulty in walking, not elsewhere classified  Unsteadiness on feet  Muscle weakness (generalized)  Other symptoms and signs involving the musculoskeletal system     Problem List Patient Active Problem List   Diagnosis Date Noted  . Long term current use of anticoagulant 12/25/2016  . COPD (chronic obstructive pulmonary disease) ([place]) 04/18/2016  . Paroxysmal atrial fibrillation ([place]) 01/02/2016  . Hypokalemia 11/03/2014  . URI (upper respiratory infection) 11/03/2014  . Weakness 11/03/2014  . Obstructive sleep apnea 08/07/2014  . Central sleep apnea 08/07/2014  . Inguinal hernia 05/04/2014  . Chest pain with moderate risk for cardiac etiology 11/25/2013  . Fever 09/13/2013  . Pneumonia 09/13/2013  . Pancytopenia ([place]) 09/13/2013  . Muscle weakness (generalized) 08/04/2013  . Status post arthroscopy of shoulder 08/04/2013  . Pain in joint, shoulder region 08/04/2013  . Rotator cuff tear arthropathy of right shoulder 07/21/2013  . Left rotator cuff tear arthropathy 07/21/2013  . Shortness of breath 07/07/2013  . 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 Hyperlipidemia with target LDL less than 100 10/08/2012  . Prediabetes 10/08/2012  . Expressive aphasia 10/08/2012  . Hemiplegia affecting right dominant side ([place]) 10/08/2012  . 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 Hx of bladder cancer 10/07/2012  . 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 ASTHMA,  UNSPECIFIED  03/22/2010  . Nonspecific (abnormal) findings on radiological and other examination of body structure 03/22/2010  . Hx of lymphoma 03/21/2010  . Multiple myeloma in remission ([place]) 03/21/2010  . Anxiety state 03/21/2010  . Essential hypertension 03/21/2010  . MYOCARDIAL INFARCTION 03/21/2010  . Coronary atherosclerosis 03/21/2010  . NEPHROLITHIASIS 03/21/2010  . ELEVATED PROSTATE SPECIFIC ANTIGEN 03/21/2010  . ROTATOR CUFF REPAIR, RIGHT, HX OF 03/21/2010    3:37 PM,05/27/17 Elly Modena PT, DPT [facility] [place] [address] [place], [place], [address] Phone: [phone]   Fax:  [phone]  Name: Jeremy Parrish MRN: [id number] Date of Birth: 12/15/1940

## 2017-05-29 ENCOUNTER — Ambulatory Visit (HOSPITAL_COMMUNITY): Payer: Medicare Other | Admitting: Physical Therapy

## 2017-05-29 DIAGNOSIS — R2681 Unsteadiness on feet: Secondary | ICD-10-CM

## 2017-05-29 DIAGNOSIS — R29898 Other symptoms and signs involving the musculoskeletal system: Secondary | ICD-10-CM

## 2017-05-29 DIAGNOSIS — R262 Difficulty in walking, not elsewhere classified: Secondary | ICD-10-CM | POA: Diagnosis not present

## 2017-05-29 DIAGNOSIS — M6281 Muscle weakness (generalized): Secondary | ICD-10-CM

## 2017-05-29 NOTE — Therapy (Signed)
[place], [place], [address] Phone: [phone]   Fax:  [phone]  Physical Therapy Treatment  Patient Details  Name: Jeremy Parrish MRN: [id number] Date of Birth: [birth year] Referring Provider: Baltazar Apo, MD  Encounter Date: 05/29/2017      PT End of Session - 05/29/17 1307    Visit Number 4   Number of Visits 9   Date for PT Re-Evaluation 06/16/17   Authorization Type Medicare Part A and B (Secondary: BCBS Supplemental)   Authorization Time Period 05/19/17 to 06/16/17   Authorization - Visit Number 3   Authorization - Number of Visits 10   PT Start Time 1302   PT Stop Time 1346  5 min untimed    PT Time Calculation (min) 44 min   Equipment Utilized During Treatment Gait belt   Activity Tolerance Patient tolerated treatment well;Patient limited by fatigue   Behavior During Therapy [facility] for tasks assessed/performed      Past Medical History:  Diagnosis Date  . Anemia   . Aortic aneurysm of unspecified site without mention of rupture   . Arthritis   . Bladder neck contracture   . Cancer ([place])   . Cerebral atherosclerosis    Carotid Doppler, 02/16/2013 - Bilateral Proximal ICAs,demonstrate mild plaque w/o evidence of significant diameter reduction, dissection, or any other vascular abnormality  . CHF (congestive heart failure) ([place])   . Complication of anesthesia   . COPD (chronic obstructive pulmonary disease) ([place])   . Coronary artery disease   . Depression   . Esophageal reflux   . Heart disease   . Heart murmur   . Hx of bladder cancer 10/07/2012  . Hyperlipidemia   . Hypertension   . 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 Intestinovesical fistula   . Kidney stones    history  . Lung mass   . Multiple myeloma   . Myocardial infarction [facility])    '96  . Non Hodgkin's lymphoma ([place])   . Paroxysmal atrial fibrillation  ([place]) 01/02/2016  . Peripheral arterial disease ([place])   . Personal history of other diseases of circulatory system   . PONV (postoperative nausea and vomiting)   . Prostate cancer ([place]) 2000  . Shingles   . Shortness of breath   . Sleep apnea    05-02-14 cpap , not yet used- suggested settings 5  . Stroke [facility]) 2013   Speech.    Past Surgical History:  Procedure Laterality Date  . BLADDER SURGERY    . BONE MARROW TRANSPLANT  2011  . COLON SURGERY     colon resection  . COLONOSCOPY N/A 01/01/2013   Procedure: COLONOSCOPY;  Surgeon: Rogene Houston, MD;  [facility];  Service: Endoscopy;  Laterality: N/A;  825-moved to [place] notified pt  . CORONARY ANGIOPLASTY  06/24/2000   PCI and stenting in mid & proximal RCA  . heart stents x 5  1999  . INGUINAL HERNIA REPAIR Right 05/04/2014   Procedure: OPEN RIGHT INGUINAL HERNIA REPAIR with mesh;  Surgeon: Edward Jolly, MD;  [facility];  Service: General;  Laterality: Right;  . INSERT / REPLACE / REMOVE PACEMAKER    . left ear skin cancer removed    . NM MYOCAR PERF WALL MOTION  11/27/2007   inferior scar  . PACEMAKER INSERTION  07/22/2011   [employer]  . PORTACATH PLACEMENT  07/26/2009  right chest  . PROSTATE SURGERY    . Rotator    . ROTATOR CUFF REPAIR Right   . SHOULDER ARTHROSCOPY WITH SUBACROMIAL DECOMPRESSION Right 07/21/2013   Procedure: RIGHT SHOULDER ARTHROSCOPY WITH SUBACROMIAL DECOMPRESSION AND DEBRIDEMENT & Injection of Left Shoulder;  Surgeon: Alta Corning, MD;  Location: [place];  Service: Orthopedics;  Laterality: Right;  . TEE WITHOUT CARDIOVERSION  10/13/2012   Procedure: TRANSESOPHAGEAL ECHOCARDIOGRAM (TEE);  Surgeon: Sanda Klein, MD;  Location: [facility] ENDOSCOPY;  Service: Cardiovascular;  Laterality: N/A;  pat/kay/echo notified  . US ECHOCARDIOGRAPHY  06/19/2011   RV mildly dilated,mild to mod. MR,mild AI,mild PI  . WRIST SURGERY     right    There were no vitals filed for this visit.       Subjective Assessment - 05/29/17 1304    Subjective Pt reports that his arms are a little sore today. He has no other complaints.    Currently in Pain? No/denies                         [facility] Adult PT Treatment/Exercise - 05/29/17 0001      Knee/Hip Exercises: Standing   Heel Raises Both;3 sets;10 reps;Other (comment)  on incline   Heel Raises Limitations toe raises 2x15 reps from incline      Knee/Hip Exercises: Seated   Other Seated Knee/Hip Exercises Rt and Lt Ankle inversion/eversion x15 reps each with blue TB              Balance Exercises - 05/29/17 1333      Balance Exercises: Standing   Standing Eyes Opened Narrow base of support (BOS);Foam/compliant surface;Other (comment)  volleyball toss on trampoline x20 reps    Tandem Stance Eyes open;Foam/compliant surface;Other (comment)  each LE forward with plyotoss x20 reps each    SLS 2 reps;20 secs           PT Education - 05/29/17 1342    Education provided Yes   Education Details technique with therex    Person(s) Educated Patient   Methods Explanation   Comprehension Verbalized understanding;Returned demonstration          PT Short Term Goals - 05/19/17 1227      PT SHORT TERM GOAL #1   Title Pt will be independent and consistently perform HEP to maximize return to PLOF.   Time 2   Period Weeks   Status New     PT SHORT TERM GOAL #2   Title Pt will have improved BLE strength to 5/5 throughout in order to maximize his gait and decrease risk for falls.   Time 2   Period Weeks   Status New     PT SHORT TERM GOAL #3   Title Pt will report being able to was dishes for 20 mins or longer to demonstrate improved balance, endurance, and improved tolerance to standing to maximize function at home.   Time 2   Period Weeks   Status New           PT Long Term Goals - 05/19/17 1229      PT LONG TERM GOAL #1   Title Pt will have improved 6MWT to at least 1275f with no rest breaks  during the test to demonstrate improved functional strength and overall endurance in order to maximize community participation.   Time 4   Period Weeks   Status New     PT LONG TERM GOAL #2   Title  Pt will have improved SLS to at least 15 seconds on BLE to demonstrate improved balance and maximize gait on uneven ground.   Time 2   Period Weeks   Status New     PT LONG TERM GOAL #3   Title Pt will report participating in a regular walking program at least 3x/week to demonstrate improved enduarance and to maximize return to PLOF.   Time 4   Period Weeks   Status New     PT LONG TERM GOAL #4   Title Pt will have improved BERG balance score to at least 52/56 to demonstrate improved overall balance and decrease risk for falls.   Time 4   Period Weeks   Status New               Plan - 05/29/17 1343    Clinical Impression Statement Continued this session with ankle strengthening and balance activity this session. Pt was able to complete static balance activity with minimal difficulty. Pt complaining of dizziness with position changes during today's session. Per evaluating therapist, assessed pt's visual fields with no asymmetries or limitations noted. Also briefly assessed pt's horizontal saccades with over correction noted. Pt was able to complete higher level static balance activities with minimal difficulties, however single leg balance continues to be difficult for him. Will continue with current POC.    Rehab Potential Good   PT Frequency 2x / week   PT Duration 4 weeks   PT Treatment/Interventions ADLs/Self Care Home Management;DME Instruction;Gait training;Stair training;Functional mobility training;Therapeutic activities;Therapeutic exercise;Balance training;Neuromuscular re-education;Patient/family education;Manual techniques;Passive range of motion;Energy conservation   PT Next Visit Plan consider vestibular involement. Continue with ankle strengthening, dynamic balance  activity   PT Home Exercise Plan 7/9: bridging, STS   Consulted and Agree with Plan of Care Patient      Patient will benefit from skilled therapeutic intervention in order to improve the following deficits and impairments:  Abnormal gait, Cardiopulmonary status limiting activity, Decreased activity tolerance, Decreased balance, Decreased endurance, Decreased strength, Difficulty walking  Visit Diagnosis: Difficulty in walking, not elsewhere classified  Unsteadiness on feet  Muscle weakness (generalized)  Other symptoms and signs involving the musculoskeletal system     Problem List Patient Active Problem List   Diagnosis Date Noted  . Long term current use of anticoagulant 12/25/2016  . COPD (chronic obstructive pulmonary disease) ([place]) 04/18/2016  . Paroxysmal atrial fibrillation ([place]) 01/02/2016  . Hypokalemia 11/03/2014  . URI (upper respiratory infection) 11/03/2014  . Weakness 11/03/2014  . Obstructive sleep apnea 08/07/2014  . Central sleep apnea 08/07/2014  . Inguinal hernia 05/04/2014  . Chest pain with moderate risk for cardiac etiology 11/25/2013  . Fever 09/13/2013  . Pneumonia 09/13/2013  . Pancytopenia ([place]) 09/13/2013  . Muscle weakness (generalized) 08/04/2013  . Status post arthroscopy of shoulder 08/04/2013  . Pain in joint, shoulder region 08/04/2013  . Rotator cuff tear arthropathy of right shoulder 07/21/2013  . Left rotator cuff tear arthropathy 07/21/2013  . Shortness of breath 07/07/2013  . Aneurysm of iliac artery ([place]) 01/26/2013  . Hyperlipidemia with target LDL less than 100 10/08/2012  . Prediabetes 10/08/2012  . Expressive aphasia 10/08/2012  . Hemiplegia affecting right dominant side ([place]) 10/08/2012  . 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 Hx of bladder cancer 10/07/2012  . 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 Hypogammaglobulinemia ([place])  09/28/2012  . ASTHMA, UNSPECIFIED 03/22/2010  . Nonspecific (  abnormal) findings on radiological and other examination of body structure 03/22/2010  . Hx of lymphoma 03/21/2010  . Multiple myeloma in remission ([place]) 03/21/2010  . Anxiety state 03/21/2010  . Essential hypertension 03/21/2010  . MYOCARDIAL INFARCTION 03/21/2010  . Coronary atherosclerosis 03/21/2010  . NEPHROLITHIASIS 03/21/2010  . ELEVATED PROSTATE SPECIFIC ANTIGEN 03/21/2010  . ROTATOR CUFF REPAIR, RIGHT, HX OF 03/21/2010    2:15 PM,05/29/17 Elly Modena PT, DPT [facility] [place] [address] [place], [place], [address] Phone: [phone]   Fax:  [phone]  Name: Jeremy Parrish MRN: [id number] Date of Birth: Mar 27, 1941

## 2017-06-02 ENCOUNTER — Ambulatory Visit (HOSPITAL_COMMUNITY): Payer: Medicare Other

## 2017-06-02 ENCOUNTER — Encounter (HOSPITAL_COMMUNITY): Payer: Self-pay

## 2017-06-02 DIAGNOSIS — R2681 Unsteadiness on feet: Secondary | ICD-10-CM

## 2017-06-02 DIAGNOSIS — R29898 Other symptoms and signs involving the musculoskeletal system: Secondary | ICD-10-CM | POA: Diagnosis not present

## 2017-06-02 DIAGNOSIS — M6281 Muscle weakness (generalized): Secondary | ICD-10-CM

## 2017-06-02 DIAGNOSIS — R262 Difficulty in walking, not elsewhere classified: Secondary | ICD-10-CM | POA: Diagnosis not present

## 2017-06-02 NOTE — Therapy (Signed)
[place], [place], [address] Phone: [phone]   Fax:  [phone]  Physical Therapy Treatment  Patient Details  Name: Jeremy Parrish MRN: [id number] Date of Birth: 05-Nov-1941 Referring Provider: Baltazar Apo, MD  Encounter Date: 06/02/2017      PT End of Session - 06/02/17 1038    Visit Number 5   Number of Visits 9   Date for PT Re-Evaluation 06/16/17   Authorization Type Medicare Part A and B (Secondary: BCBS Supplemental)   Authorization Time Period 05/19/17 to 06/16/17   Authorization - Visit Number 5   Authorization - Number of Visits 10   PT Start Time 2119   PT Stop Time 1120   PT Time Calculation (min) 45 min   Equipment Utilized During Treatment Gait belt   Activity Tolerance Patient tolerated treatment well;Patient limited by fatigue   Behavior During Therapy [facility] for tasks assessed/performed      Past Medical History:  Diagnosis Date  . Anemia   . Aortic aneurysm of unspecified site without mention of rupture   . Arthritis   . Bladder neck contracture   . Cancer ([place])   . Cerebral atherosclerosis    Carotid Doppler, 02/16/2013 - Bilateral Proximal ICAs,demonstrate mild plaque w/o evidence of significant diameter reduction, dissection, or any other vascular abnormality  . CHF (congestive heart failure) ([place])   . Complication of anesthesia   . COPD (chronic obstructive pulmonary disease) ([place])   . Coronary artery disease   . Depression   . Esophageal reflux   . Heart disease   . Heart murmur   . Hx of bladder cancer 10/07/2012  . Hyperlipidemia   . Hypertension   . 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 Intestinovesical fistula   . Kidney stones    history  . Lung mass   . Multiple myeloma   . Myocardial infarction [facility])    '96  . Non Hodgkin's lymphoma ([place])   . Paroxysmal atrial fibrillation ([place]) 01/02/2016  .  Peripheral arterial disease ([place])   . Personal history of other diseases of circulatory system   . PONV (postoperative nausea and vomiting)   . Prostate cancer ([place]) 2000  . Shingles   . Shortness of breath   . Sleep apnea    05-02-14 cpap , not yet used- suggested settings 5  . Stroke [facility]) 2013   Speech.    Past Surgical History:  Procedure Laterality Date  . BLADDER SURGERY    . BONE MARROW TRANSPLANT  2011  . COLON SURGERY     colon resection  . COLONOSCOPY N/A 01/01/2013   Procedure: COLONOSCOPY;  Surgeon: Rogene Houston, MD;  [facility];  Service: Endoscopy;  Laterality: N/A;  825-moved to [place] notified pt  . CORONARY ANGIOPLASTY  06/24/2000   PCI and stenting in mid & proximal RCA  . heart stents x 5  1999  . INGUINAL HERNIA REPAIR Right 05/04/2014   Procedure: OPEN RIGHT INGUINAL HERNIA REPAIR with mesh;  Surgeon: Edward Jolly, MD;  [facility];  Service: General;  Laterality: Right;  . INSERT / REPLACE / REMOVE PACEMAKER    . left ear skin cancer removed    . NM MYOCAR PERF WALL MOTION  11/27/2007   inferior scar  . PACEMAKER INSERTION  07/22/2011   Medtronic  . PORTACATH PLACEMENT  07/26/2009   right chest  .  PROSTATE SURGERY    . Rotator    . ROTATOR CUFF REPAIR Right   . SHOULDER ARTHROSCOPY WITH SUBACROMIAL DECOMPRESSION Right 07/21/2013   Procedure: RIGHT SHOULDER ARTHROSCOPY WITH SUBACROMIAL DECOMPRESSION AND DEBRIDEMENT & Injection of Left Shoulder;  Surgeon: Alta Corning, MD;  Location: [place];  Service: Orthopedics;  Laterality: Right;  . TEE WITHOUT CARDIOVERSION  10/13/2012   Procedure: TRANSESOPHAGEAL ECHOCARDIOGRAM (TEE);  Surgeon: Sanda Klein, MD;  Location: [facility] ENDOSCOPY;  Service: Cardiovascular;  Laterality: N/A;  pat/kay/echo notified  . US ECHOCARDIOGRAPHY  06/19/2011   RV mildly dilated,mild to mod. MR,mild AI,mild PI  . WRIST SURGERY     right    There were no vitals filed for this visit.      Subjective Assessment -  06/02/17 1037    Subjective Pt states that he feels good today. He denies any falls or close calls.   Currently in Pain? No/denies                [facility] Adult PT Treatment/Exercise - 06/02/17 0001      Knee/Hip Exercises: Aerobic   Nustep x5 mins at EOS, L2, UE and LE (unbilled)     Knee/Hip Exercises: Standing   Heel Raises Both;2 sets;15 reps   Heel Raises Limitations toe raises 2x15 reps from incline      Knee/Hip Exercises: Seated   Other Seated Knee/Hip Exercises ankle 4-way with BTB x10 reps each (added to HEP)         Balance Exercises - 06/02/17 1051      Balance Exercises: Standing   Standing Eyes Closed Foam/compliant surface;10 secs;2 reps  attempted serial 7s but pt stopped counting   SLS Eyes open;Foam/compliant surface;Intermittent upper extremity support  10 reps x 5 sec BLE   Tandem Gait Forward;Intermittent upper extremity support;5 reps  EC in // bars   Marching Limitations cone taps on foam x 5 reps each; cone taps on foam and dual task thinking of male names A-S (switched legs half way through)              PT Education - 06/02/17 1117    Education provided Yes   Education Details exercise technique, updated HEP   Person(s) Educated Patient   Methods Explanation;Demonstration;Handout   Comprehension Verbalized understanding;Returned demonstration          PT Short Term Goals - 05/19/17 1227      PT SHORT TERM GOAL #1   Title Pt will be independent and consistently perform HEP to maximize return to PLOF.   Time 2   Period Weeks   Status New     PT SHORT TERM GOAL #2   Title Pt will have improved BLE strength to 5/5 throughout in order to maximize his gait and decrease risk for falls.   Time 2   Period Weeks   Status New     PT SHORT TERM GOAL #3   Title Pt will report being able to was dishes for 20 mins or longer to demonstrate improved balance, endurance, and improved tolerance to standing to maximize function at home.    Time 2   Period Weeks   Status New           PT Long Term Goals - 05/19/17 1229      PT LONG TERM GOAL #1   Title Pt will have improved 6MWT to at least 126f with no rest breaks during the test to demonstrate improved functional strength and overall endurance in  order to maximize community participation.   Time 4   Period Weeks   Status New     PT LONG TERM GOAL #2   Title Pt will have improved SLS to at least 15 seconds on BLE to demonstrate improved balance and maximize gait on uneven ground.   Time 2   Period Weeks   Status New     PT LONG TERM GOAL #3   Title Pt will report participating in a regular walking program at least 3x/week to demonstrate improved enduarance and to maximize return to PLOF.   Time 4   Period Weeks   Status New     PT LONG TERM GOAL #4   Title Pt will have improved BERG balance score to at least 52/56 to demonstrate improved overall balance and decrease risk for falls.   Time 4   Period Weeks   Status New               Plan - 06/02/17 1119    Clinical Impression Statement Session focused on challenging balance and introduced pt to exercises that incorporated his vestibular system. Pt did well throughout entire session but he was very challenged by [facility] tandem gait. Also introduced pt to dual task activities which he had some difficulty with. Updated HEP to include 4-way ankle with BTB. Continue POC as planned.   Rehab Potential Good   PT Frequency 2x / week   PT Duration 4 weeks   PT Treatment/Interventions ADLs/Self Care Home Management;DME Instruction;Gait training;Stair training;Functional mobility training;Therapeutic activities;Therapeutic exercise;Balance training;Neuromuscular re-education;Patient/family education;Manual techniques;Passive range of motion;Energy conservation   PT Next Visit Plan Continue with ankle strengthening, progress dynamic balance activity; continue activites with EC to challenge vestibular system   PT Home  Exercise Plan 7/9: bridging, STS; 7/23: 4-way ankle with BTB   Consulted and Agree with Plan of Care Patient      Patient will benefit from skilled therapeutic intervention in order to improve the following deficits and impairments:  Abnormal gait, Cardiopulmonary status limiting activity, Decreased activity tolerance, Decreased balance, Decreased endurance, Decreased strength, Difficulty walking  Visit Diagnosis: Difficulty in walking, not elsewhere classified  Unsteadiness on feet  Muscle weakness (generalized)  Other symptoms and signs involving the musculoskeletal system     Problem List Patient Active Problem List   Diagnosis Date Noted  . Long term current use of anticoagulant 12/25/2016  . COPD (chronic obstructive pulmonary disease) ([place]) 04/18/2016  . Paroxysmal atrial fibrillation ([place]) 01/02/2016  . Hypokalemia 11/03/2014  . URI (upper respiratory infection) 11/03/2014  . Weakness 11/03/2014  . Obstructive sleep apnea 08/07/2014  . Central sleep apnea 08/07/2014  . Inguinal hernia 05/04/2014  . Chest pain with moderate risk for cardiac etiology 11/25/2013  . Fever 09/13/2013  . Pneumonia 09/13/2013  . Pancytopenia ([place]) 09/13/2013  . Muscle weakness (generalized) 08/04/2013  . Status post arthroscopy of shoulder 08/04/2013  . Pain in joint, shoulder region 08/04/2013  . Rotator cuff tear arthropathy of right shoulder 07/21/2013  . Left rotator cuff tear arthropathy 07/21/2013  . Shortness of breath 07/07/2013  . 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 Hyperlipidemia with target LDL less than 100 10/08/2012  . Prediabetes 10/08/2012  . Expressive aphasia 10/08/2012  . Hemiplegia affecting right dominant side ([place]) 10/08/2012  . 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 Hx of bladder cancer 10/07/2012  . 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 Hypogammaglobulinemia  (  [place]) 09/28/2012  . ASTHMA, UNSPECIFIED 03/22/2010  . Nonspecific (abnormal) findings on radiological and other examination of body structure 03/22/2010  . Hx of lymphoma 03/21/2010  . Multiple myeloma in remission ([place]) 03/21/2010  . Anxiety state 03/21/2010  . Essential hypertension 03/21/2010  . MYOCARDIAL INFARCTION 03/21/2010  . Coronary atherosclerosis 03/21/2010  . NEPHROLITHIASIS 03/21/2010  . ELEVATED PROSTATE SPECIFIC ANTIGEN 03/21/2010  . ROTATOR CUFF REPAIR, RIGHT, HX OF 03/21/2010     Geraldine Solar PT, DPT  [facility] [address] [place], [place], [address] Phone: [phone]   Fax:  [phone]  Name: Jeremy Parrish MRN: [id number] Date of Birth: March 08, 1941

## 2017-06-02 NOTE — Patient Instructions (Signed)
  Ankle 4way with TB  All theraband exercise is slow and controlled. Do not let the band "bounce" back. A. Plantarflexion: "gas pedal." Keep knee straight.Band around "ball of foot" and press it away as far as possible and slowly return to neutral. Repeat. B. Dorsiflexion: start in neutral and pull theraband back toward you as far as possible. pause. return slowly. keep knee straight. C.Inversion: start neutral and bring band toward your midline without bending or twisting knee. D. Eversion: start neutral and press band out without bending or twisting knee. 

## 2017-06-03 DIAGNOSIS — C44222 Squamous cell carcinoma of skin of right ear and external auricular canal: Secondary | ICD-10-CM | POA: Diagnosis not present

## 2017-06-03 DIAGNOSIS — S01301A Unspecified open wound of right ear, initial encounter: Secondary | ICD-10-CM | POA: Diagnosis not present

## 2017-06-04 ENCOUNTER — Ambulatory Visit (HOSPITAL_COMMUNITY): Payer: Medicare Other

## 2017-06-04 ENCOUNTER — Encounter (HOSPITAL_COMMUNITY): Payer: Self-pay

## 2017-06-04 DIAGNOSIS — R2681 Unsteadiness on feet: Secondary | ICD-10-CM

## 2017-06-04 DIAGNOSIS — R262 Difficulty in walking, not elsewhere classified: Secondary | ICD-10-CM | POA: Diagnosis not present

## 2017-06-04 DIAGNOSIS — M6281 Muscle weakness (generalized): Secondary | ICD-10-CM | POA: Diagnosis not present

## 2017-06-04 DIAGNOSIS — R29898 Other symptoms and signs involving the musculoskeletal system: Secondary | ICD-10-CM

## 2017-06-04 NOTE — Therapy (Signed)
[place], [place], [address] Phone: [phone]   Fax:  [phone]  Physical Therapy Treatment  Patient Details  Name: Jeremy Parrish MRN: [id number] Date of Birth: Sep 16, 1941 Referring Provider: Baltazar Apo, MD  Encounter Date: 06/04/2017      PT End of Session - 06/04/17 1025    Visit Number 6   Number of Visits 9   Date for PT Re-Evaluation 06/16/17   Authorization Type Medicare Part A and B (Secondary: BCBS Supplemental)   Authorization Time Period 05/19/17 to 06/16/17   Authorization - Visit Number 6   Authorization - Number of Visits 10   PT Start Time 1025   PT Stop Time 1110   PT Time Calculation (min) 45 min   Equipment Utilized During Treatment Gait belt   Activity Tolerance Patient tolerated treatment well;Patient limited by fatigue   Behavior During Therapy [facility] for tasks assessed/performed      Past Medical History:  Diagnosis Date  . Anemia   . Aortic aneurysm of unspecified site without mention of rupture   . Arthritis   . Bladder neck contracture   . Cancer ([place])   . Cerebral atherosclerosis    Carotid Doppler, 02/16/2013 - Bilateral Proximal ICAs,demonstrate mild plaque w/o evidence of significant diameter reduction, dissection, or any other vascular abnormality  . CHF (congestive heart failure) ([place])   . Complication of anesthesia   . COPD (chronic obstructive pulmonary disease) ([place])   . Coronary artery disease   . Depression   . Esophageal reflux   . Heart disease   . Heart murmur   . Hx of bladder cancer 10/07/2012  . Hyperlipidemia   . Hypertension   . 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 Intestinovesical fistula   . Kidney stones    history  . Lung mass   . Multiple myeloma   . Myocardial infarction [facility])    '96  . Non Hodgkin's lymphoma ([place])   . Paroxysmal atrial fibrillation ([place]) 01/02/2016  .  Peripheral arterial disease ([place])   . Personal history of other diseases of circulatory system   . PONV (postoperative nausea and vomiting)   . Prostate cancer ([place]) 2000  . Shingles   . Shortness of breath   . Sleep apnea    05-02-14 cpap , not yet used- suggested settings 5  . Stroke [facility]) 2013   Speech.    Past Surgical History:  Procedure Laterality Date  . BLADDER SURGERY    . BONE MARROW TRANSPLANT  2011  . COLON SURGERY     colon resection  . COLONOSCOPY N/A 01/01/2013   Procedure: COLONOSCOPY;  Surgeon: Rogene Houston, MD;  [facility];  Service: Endoscopy;  Laterality: N/A;  825-moved to [place] notified pt  . CORONARY ANGIOPLASTY  06/24/2000   PCI and stenting in mid & proximal RCA  . heart stents x 5  1999  . INGUINAL HERNIA REPAIR Right 05/04/2014   Procedure: OPEN RIGHT INGUINAL HERNIA REPAIR with mesh;  Surgeon: Edward Jolly, MD;  [facility];  Service: General;  Laterality: Right;  . INSERT / REPLACE / REMOVE PACEMAKER    . left ear skin cancer removed    . NM MYOCAR PERF WALL MOTION  11/27/2007   inferior scar  . PACEMAKER INSERTION  07/22/2011   [employer]  . PORTACATH PLACEMENT  07/26/2009   right chest  .  PROSTATE SURGERY    . Rotator    . ROTATOR CUFF REPAIR Right   . SHOULDER ARTHROSCOPY WITH SUBACROMIAL DECOMPRESSION Right 07/21/2013   Procedure: RIGHT SHOULDER ARTHROSCOPY WITH SUBACROMIAL DECOMPRESSION AND DEBRIDEMENT & Injection of Left Shoulder;  Surgeon: Alta Corning, MD;  Location: [place];  Service: Orthopedics;  Laterality: Right;  . TEE WITHOUT CARDIOVERSION  10/13/2012   Procedure: TRANSESOPHAGEAL ECHOCARDIOGRAM (TEE);  Surgeon: Sanda Klein, MD;  Location: [facility] ENDOSCOPY;  Service: Cardiovascular;  Laterality: N/A;  pat/kay/echo notified  . US ECHOCARDIOGRAPHY  06/19/2011   RV mildly dilated,mild to mod. MR,mild AI,mild PI  . WRIST SURGERY     right    There were no vitals filed for this visit.      Subjective Assessment -  06/04/17 1025    Subjective Pt states that he feels so-so this morning because he didn't sleep well last night. His surgery for the mole on his ear went well and they got all of it.   Currently in Pain? No/denies              [facility] Adult PT Treatment/Exercise - 06/04/17 0001      Knee/Hip Exercises: Aerobic   Nustep x98mns at EOS, L2, UE and LE (unbilled)     Knee/Hip Exercises: Standing   Heel Raises Both;2 sets;15 reps   Heel Raises Limitations on incline for both; toe raises 2x15 reps   Forward Step Up Both;1 set;15 reps;Hand Hold: 0;Step Height: 6"   Forward Step Up Limitations contralateral knee drive          Balance Exercises - 06/04/17 1042      Balance Exercises: Standing   Tandem Stance Eyes open;Intermittent upper extremity support  BLE 2sets x 10 reps each with head turns   SLS Eyes open;Foam/compliant surface;Intermittent upper extremity support;5 reps  5 sec each   Rockerboard Lateral;EC;Other time (comment)  2 x 1 min   Tandem Gait Forward;Retro;Intermittent upper extremity support;5 reps  EC in // bars              PT Education - 06/04/17 1106    Education provided Yes   Education Details exercise technique, proper weight shift and attaining balance before moving   Person(s) Educated Patient   Methods Explanation;Demonstration   Comprehension Verbalized understanding;Returned demonstration          PT Short Term Goals - 05/19/17 1227      PT SHORT TERM GOAL #1   Title Pt will be independent and consistently perform HEP to maximize return to PLOF.   Time 2   Period Weeks   Status New     PT SHORT TERM GOAL #2   Title Pt will have improved BLE strength to 5/5 throughout in order to maximize his gait and decrease risk for falls.   Time 2   Period Weeks   Status New     PT SHORT TERM GOAL #3   Title Pt will report being able to was dishes for 20 mins or longer to demonstrate improved balance, endurance, and improved tolerance to  standing to maximize function at home.   Time 2   Period Weeks   Status New           PT Long Term Goals - 05/19/17 1229      PT LONG TERM GOAL #1   Title Pt will have improved 6MWT to at least 12067fwith no rest breaks during the test to demonstrate improved functional strength and overall  endurance in order to maximize community participation.   Time 4   Period Weeks   Status New     PT LONG TERM GOAL #2   Title Pt will have improved SLS to at least 15 seconds on BLE to demonstrate improved balance and maximize gait on uneven ground.   Time 2   Period Weeks   Status New     PT LONG TERM GOAL #3   Title Pt will report participating in a regular walking program at least 3x/week to demonstrate improved enduarance and to maximize return to PLOF.   Time 4   Period Weeks   Status New     PT LONG TERM GOAL #4   Title Pt will have improved BERG balance score to at least 52/56 to demonstrate improved overall balance and decrease risk for falls.   Time 4   Period Weeks   Status New               Plan - 06/04/17 1108    Clinical Impression Statement Session focused on improving function strength and balance with vestibular involvement. Pt requiring supervision to min A throughout session for steadiness. Pt required rest break in between each set, requiring one long seated rest break after step ups with knee drive. Pt continues to be very challenged during activities with EC. Continue POC as planned.    Rehab Potential Good   PT Frequency 2x / week   PT Duration 4 weeks   PT Treatment/Interventions ADLs/Self Care Home Management;DME Instruction;Gait training;Stair training;Functional mobility training;Therapeutic activities;Therapeutic exercise;Balance training;Neuromuscular re-education;Patient/family education;Manual techniques;Passive range of motion;Energy conservation   PT Next Visit Plan Continue with ankle strengthening, progress dynamic balance activity; continue  activites with EC to challenge vestibular system; add sidestepping on foam with EO/EC/over hurdles, continue dynamic gait, tandem stance on 1/2 foam roll with dynamic movements/EC; tandem gait EO with ball toss to self   PT Home Exercise Plan 7/9: bridging, STS; 7/23: 4-way ankle with BTB   Consulted and Agree with Plan of Care Patient      Patient will benefit from skilled therapeutic intervention in order to improve the following deficits and impairments:  Abnormal gait, Cardiopulmonary status limiting activity, Decreased activity tolerance, Decreased balance, Decreased endurance, Decreased strength, Difficulty walking  Visit Diagnosis: Difficulty in walking, not elsewhere classified  Unsteadiness on feet  Muscle weakness (generalized)  Other symptoms and signs involving the musculoskeletal system     Problem List Patient Active Problem List   Diagnosis Date Noted  . Long term current use of anticoagulant 12/25/2016  . COPD (chronic obstructive pulmonary disease) ([place]) 04/18/2016  . Paroxysmal atrial fibrillation ([place]) 01/02/2016  . Hypokalemia 11/03/2014  . URI (upper respiratory infection) 11/03/2014  . Weakness 11/03/2014  . Obstructive sleep apnea 08/07/2014  . Central sleep apnea 08/07/2014  . Inguinal hernia 05/04/2014  . Chest pain with moderate risk for cardiac etiology 11/25/2013  . Fever 09/13/2013  . Pneumonia 09/13/2013  . Pancytopenia ([place]) 09/13/2013  . Muscle weakness (generalized) 08/04/2013  . Status post arthroscopy of shoulder 08/04/2013  . Pain in joint, shoulder region 08/04/2013  . Rotator cuff tear arthropathy of right shoulder 07/21/2013  . Left rotator cuff tear arthropathy 07/21/2013  . Shortness of breath 07/07/2013  . Aneurysm of iliac artery ([place]) 01/26/2013  . Hyperlipidemia with target LDL less than 100 10/08/2012  . Prediabetes 10/08/2012  . Expressive aphasia 10/08/2012  . Hemiplegia affecting right dominant side ([place]) 10/08/2012  .  H/O cardiac pacemaker,  [employer]tronic REVO, MRI conditional device, placed 07/2011 for sympyomatic bradycardia 10/07/2012  . Hx of bladder cancer 10/07/2012  . 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 ASTHMA, UNSPECIFIED 03/22/2010  . Nonspecific (abnormal) findings on radiological and other examination of body structure 03/22/2010  . Hx of lymphoma 03/21/2010  . Multiple myeloma in remission ([place]) 03/21/2010  . Anxiety state 03/21/2010  . Essential hypertension 03/21/2010  . MYOCARDIAL INFARCTION 03/21/2010  . Coronary atherosclerosis 03/21/2010  . NEPHROLITHIASIS 03/21/2010  . ELEVATED PROSTATE SPECIFIC ANTIGEN 03/21/2010  . ROTATOR CUFF REPAIR, RIGHT, HX OF 03/21/2010      Geraldine Solar PT, DPT   [facility] [address] [place], [place], [address] Phone: [phone]   Fax:  [phone]  Name: Jeremy Parrish MRN: [id number] Date of Birth: Jul 28, 1941

## 2017-06-06 ENCOUNTER — Other Ambulatory Visit (HOSPITAL_COMMUNITY): Payer: Self-pay

## 2017-06-06 DIAGNOSIS — C9001 Multiple myeloma in remission: Secondary | ICD-10-CM

## 2017-06-06 MED ORDER — LENALIDOMIDE 10 MG PO CAPS: ORAL_CAPSULE | ORAL | 0 refills | Status: DC

## 2017-06-06 NOTE — Progress Notes (Signed)
Financial counselor needed prescription for patient for revlimid for speciality pharmacy.

## 2017-06-08 ENCOUNTER — Other Ambulatory Visit: Payer: Self-pay | Admitting: Cardiology

## 2017-06-08 DIAGNOSIS — E785 Hyperlipidemia, unspecified: Secondary | ICD-10-CM

## 2017-06-09 ENCOUNTER — Ambulatory Visit (HOSPITAL_COMMUNITY): Payer: Medicare Other | Admitting: Physical Therapy

## 2017-06-09 DIAGNOSIS — R29898 Other symptoms and signs involving the musculoskeletal system: Secondary | ICD-10-CM | POA: Diagnosis not present

## 2017-06-09 DIAGNOSIS — R262 Difficulty in walking, not elsewhere classified: Secondary | ICD-10-CM

## 2017-06-09 DIAGNOSIS — R2681 Unsteadiness on feet: Secondary | ICD-10-CM | POA: Diagnosis not present

## 2017-06-09 DIAGNOSIS — M6281 Muscle weakness (generalized): Secondary | ICD-10-CM | POA: Diagnosis not present

## 2017-06-09 NOTE — Telephone Encounter (Signed)
Rx has been sent to the pharmacy electronically. ° °

## 2017-06-09 NOTE — Therapy (Signed)
[place], [place], [address] Phone: [phone]   Fax:  [phone]  Physical Therapy Treatment  Patient Details  Name: Jeremy Parrish MRN: [id number] Date of Birth: 21-Apr-1941 Referring Provider: Baltazar Apo, MD  Encounter Date: 06/09/2017      PT End of Session - 06/09/17 1121    Visit Number 7   Number of Visits 9   Date for PT Re-Evaluation 06/16/17   Authorization Type Medicare Part A and B (Secondary: BCBS Supplemental)   Authorization Time Period 05/19/17 to 06/16/17   Authorization - Visit Number 7   Authorization - Number of Visits 10   PT Start Time 1033   PT Stop Time 1122   PT Time Calculation (min) 49 min   Equipment Utilized During Treatment Gait belt   Activity Tolerance Patient tolerated treatment well;Patient limited by fatigue   Behavior During Therapy [facility] for tasks assessed/performed      Past Medical History:  Diagnosis Date  . Anemia   . Aortic aneurysm of unspecified site without mention of rupture   . Arthritis   . Bladder neck contracture   . Cancer ([place])   . Cerebral atherosclerosis    Carotid Doppler, 02/16/2013 - Bilateral Proximal ICAs,demonstrate mild plaque w/o evidence of significant diameter reduction, dissection, or any other vascular abnormality  . CHF (congestive heart failure) ([place])   . Complication of anesthesia   . COPD (chronic obstructive pulmonary disease) ([place])   . Coronary artery disease   . Depression   . Esophageal reflux   . Heart disease   . Heart murmur   . Hx of bladder cancer 10/07/2012  . Hyperlipidemia   . Hypertension   . 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 Intestinovesical fistula   . Kidney stones    history  . Lung mass   . Multiple myeloma   . Myocardial infarction [facility])    '96  . Non Hodgkin's lymphoma ([place])   . Paroxysmal atrial fibrillation ([place]) 01/02/2016  .  Peripheral arterial disease ([place])   . Personal history of other diseases of circulatory system   . PONV (postoperative nausea and vomiting)   . Prostate cancer ([place]) 2000  . Shingles   . Shortness of breath   . Sleep apnea    05-02-14 cpap , not yet used- suggested settings 5  . Stroke [facility]) 2013   Speech.    Past Surgical History:  Procedure Laterality Date  . BLADDER SURGERY    . BONE MARROW TRANSPLANT  2011  . COLON SURGERY     colon resection  . COLONOSCOPY N/A 01/01/2013   Procedure: COLONOSCOPY;  Surgeon: Rogene Houston, MD;  [facility];  Service: Endoscopy;  Laterality: N/A;  825-moved to [place] notified pt  . CORONARY ANGIOPLASTY  06/24/2000   PCI and stenting in mid & proximal RCA  . heart stents x 5  1999  . INGUINAL HERNIA REPAIR Right 05/04/2014   Procedure: OPEN RIGHT INGUINAL HERNIA REPAIR with mesh;  Surgeon: [place] Jolly, MD;  [facility];  Service: General;  Laterality: Right;  . INSERT / REPLACE / REMOVE PACEMAKER    . left ear skin cancer removed    . NM MYOCAR PERF WALL MOTION  11/27/2007   inferior scar  . PACEMAKER INSERTION  07/22/2011   Medtronic  . PORTACATH PLACEMENT  07/26/2009   right chest  .  PROSTATE SURGERY    . Rotator    . ROTATOR CUFF REPAIR Right   . SHOULDER ARTHROSCOPY WITH SUBACROMIAL DECOMPRESSION Right 07/21/2013   Procedure: RIGHT SHOULDER ARTHROSCOPY WITH SUBACROMIAL DECOMPRESSION AND DEBRIDEMENT & Injection of Left Shoulder;  Surgeon: Alta Corning, MD;  Location: [place];  Service: Orthopedics;  Laterality: Right;  . TEE WITHOUT CARDIOVERSION  10/13/2012   Procedure: TRANSESOPHAGEAL ECHOCARDIOGRAM (TEE);  Surgeon: Sanda Klein, MD;  Location: [facility] ENDOSCOPY;  Service: Cardiovascular;  Laterality: N/A;  pat/kay/echo notified  . US ECHOCARDIOGRAPHY  06/19/2011   RV mildly dilated,mild to mod. MR,mild AI,mild PI  . WRIST SURGERY     right    There were no vitals filed for this visit.      Subjective Assessment -  06/09/17 1117    Subjective PT states his feet are sore from doing his HEP.  Otherwise, no real pain or difficulties.     Currently in Pain? No/denies                         [facility] Adult PT Treatment/Exercise - 06/09/17 0001      Knee/Hip Exercises: Aerobic   Nustep x29mns at EOS, L2, UE and LE (unbilled)     Knee/Hip Exercises: Standing   Heel Raises Both;2 sets;15 reps   Heel Raises Limitations on incline for both; toe raises 2x15 reps   Forward Step Up Both;1 set;15 reps;Hand Hold: 0;Step Height: 6"   Forward Step Up Limitations contralateral knee drive              Balance Exercises - 06/09/17 1057      Balance Exercises: Standing   Tandem Stance Eyes open;Foam/compliant surface;2 reps;20 secs   SLS Eyes open;Foam/compliant surface;25 secs   Step Over Hurdles / Cones on balance beam, 6" and 12" forward and side ways 2RT each           PT Education - 06/09/17 1115    Education provided Yes   Education Details energy conservation, pacing self and balance corrections   Person(s) Educated Patient   Methods Explanation;Demonstration;Tactile cues;Verbal cues   Comprehension Verbalized understanding;Returned demonstration          PT Short Term Goals - 05/19/17 1227      PT SHORT TERM GOAL #1   Title Pt will be independent and consistently perform HEP to maximize return to PLOF.   Time 2   Period Weeks   Status New     PT SHORT TERM GOAL #2   Title Pt will have improved BLE strength to 5/5 throughout in order to maximize his gait and decrease risk for falls.   Time 2   Period Weeks   Status New     PT SHORT TERM GOAL #3   Title Pt will report being able to was dishes for 20 mins or longer to demonstrate improved balance, endurance, and improved tolerance to standing to maximize function at home.   Time 2   Period Weeks   Status New           PT Long Term Goals - 05/19/17 1229      PT LONG TERM GOAL #1   Title Pt will have  improved 6MWT to at least 12057fwith no rest breaks during the test to demonstrate improved functional strength and overall endurance in order to maximize community participation.   Time 4   Period Weeks   Status New     PT  LONG TERM GOAL #2   Title Pt will have improved SLS to at least 15 seconds on BLE to demonstrate improved balance and maximize gait on uneven ground.   Time 2   Period Weeks   Status New     PT LONG TERM GOAL #3   Title Pt will report participating in a regular walking program at least 3x/week to demonstrate improved enduarance and to maximize return to PLOF.   Time 4   Period Weeks   Status New     PT LONG TERM GOAL #4   Title Pt will have improved BERG balance score to at least 52/56 to demonstrate improved overall balance and decrease risk for falls.   Time 4   Period Weeks   Status New               Plan - 06/09/17 1117    Clinical Impression Statement continued focus on improving strength and balance.  Pt with noted SOB during session and required cues to manage this.  2 short seated rest breaks taken during session .  Progressed hurdles on balance beam and able to navigate laterally and forward with intermittent use of UE to steady.  worked on complete stops and regaining balance prior to continued movement.  Overall good session.    Rehab Potential Good   PT Frequency 2x / week   PT Duration 4 weeks   PT Treatment/Interventions ADLs/Self Care Home Management;DME Instruction;Gait training;Stair training;Functional mobility training;Therapeutic activities;Therapeutic exercise;Balance training;Neuromuscular re-education;Patient/family education;Manual techniques;Passive range of motion;Energy conservation   PT Next Visit Plan Continue with ankle strengthening, progress dynamic balance activity.  Next session add tandem gait EO with ball toss to self   PT Home Exercise Plan 7/9: bridging, STS; 7/23: 4-way ankle with BTB   Consulted and Agree with Plan  of Care Patient      Patient will benefit from skilled therapeutic intervention in order to improve the following deficits and impairments:  Abnormal gait, Cardiopulmonary status limiting activity, Decreased activity tolerance, Decreased balance, Decreased endurance, Decreased strength, Difficulty walking  Visit Diagnosis: Difficulty in walking, not elsewhere classified  Unsteadiness on feet  Muscle weakness (generalized)  Other symptoms and signs involving the musculoskeletal system     Problem List Patient Active Problem List   Diagnosis Date Noted  . Long term current use of anticoagulant 12/25/2016  . COPD (chronic obstructive pulmonary disease) ([place]) 04/18/2016  . Paroxysmal atrial fibrillation ([place]) 01/02/2016  . Hypokalemia 11/03/2014  . URI (upper respiratory infection) 11/03/2014  . Weakness 11/03/2014  . Obstructive sleep apnea 08/07/2014  . Central sleep apnea 08/07/2014  . Inguinal hernia 05/04/2014  . Chest pain with moderate risk for cardiac etiology 11/25/2013  . Fever 09/13/2013  . Pneumonia 09/13/2013  . Pancytopenia ([place]) 09/13/2013  . Muscle weakness (generalized) 08/04/2013  . Status post arthroscopy of shoulder 08/04/2013  . Pain in joint, shoulder region 08/04/2013  . Rotator cuff tear arthropathy of right shoulder 07/21/2013  . Left rotator cuff tear arthropathy 07/21/2013  . Shortness of breath 07/07/2013  . 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 Hyperlipidemia with target LDL less than 100 10/08/2012  . Prediabetes 10/08/2012  . Expressive aphasia 10/08/2012  . Hemiplegia affecting right dominant side ([place]) 10/08/2012  . 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 Hx of bladder cancer 10/07/2012  . 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 ASTHMA, UNSPECIFIED  03/22/2010  . Nonspecific (abnormal) findings on  radiological and other examination of body structure 03/22/2010  . Hx of lymphoma 03/21/2010  . Multiple myeloma in remission ([place]) 03/21/2010  . Anxiety state 03/21/2010  . Essential hypertension 03/21/2010  . MYOCARDIAL INFARCTION 03/21/2010  . Coronary atherosclerosis 03/21/2010  . NEPHROLITHIASIS 03/21/2010  . ELEVATED PROSTATE SPECIFIC ANTIGEN 03/21/2010  . ROTATOR CUFF REPAIR, RIGHT, HX OF 03/21/2010    [name] B 06/09/2017, 11:21 AM  [facility] [address] [place], [place], [address] Phone: [phone]   Fax:  [phone]  Name: YOSSEF GILKISON MRN: [id number] Date of Birth: December 22, 1940

## 2017-06-11 ENCOUNTER — Other Ambulatory Visit (HOSPITAL_COMMUNITY): Payer: Self-pay | Admitting: *Deleted

## 2017-06-11 ENCOUNTER — Ambulatory Visit (HOSPITAL_COMMUNITY): Payer: Medicare Other | Attending: Family Medicine

## 2017-06-11 ENCOUNTER — Encounter (HOSPITAL_COMMUNITY): Payer: Self-pay

## 2017-06-11 DIAGNOSIS — M6281 Muscle weakness (generalized): Secondary | ICD-10-CM | POA: Insufficient documentation

## 2017-06-11 DIAGNOSIS — R2681 Unsteadiness on feet: Secondary | ICD-10-CM | POA: Diagnosis not present

## 2017-06-11 DIAGNOSIS — R262 Difficulty in walking, not elsewhere classified: Secondary | ICD-10-CM | POA: Diagnosis not present

## 2017-06-11 DIAGNOSIS — R29898 Other symptoms and signs involving the musculoskeletal system: Secondary | ICD-10-CM | POA: Insufficient documentation

## 2017-06-11 DIAGNOSIS — C9001 Multiple myeloma in remission: Secondary | ICD-10-CM

## 2017-06-11 NOTE — Therapy (Signed)
[facility] [address] [place], [place], [address] Phone: [phone]   Fax:  [phone]  Physical Therapy Treatment  Patient Details  Name: Jeremy Parrish MRN: [id number] Date of Birth: [birth year] Referring Provider: Ardyth Gal, MD  Encounter Date: 06/11/2017      PT End of Session - 06/11/17 1038    Visit Number 8   Number of Visits 9   Date for PT Re-Evaluation 06/16/17   Authorization Type Medicare Part A and B (Secondary: BCBS Supplemental)   Authorization Time Period 05/19/17 to 06/16/17   Authorization - Visit Number 8   Authorization - Number of Visits 10   PT Start Time 1035   PT Stop Time 1120   PT Time Calculation (min) 45 min   Equipment Utilized During Treatment Gait belt   Activity Tolerance Patient tolerated treatment well;Patient limited by fatigue   Behavior During Therapy [facility] for tasks assessed/performed      Past Medical History:  Diagnosis Date  . Anemia   . Aortic aneurysm of unspecified site without mention of rupture   . Arthritis   . Bladder neck contracture   . Cancer ([facility])   . Cerebral atherosclerosis    Carotid Doppler, 02/16/2013 - Bilateral Proximal ICAs,demonstrate mild plaque w/o evidence of significant diameter reduction, dissection, or any other vascular abnormality  . CHF (congestive heart failure) ([facility])   . Complication of anesthesia   . COPD (chronic obstructive pulmonary disease) ([facility])   . Coronary artery disease   . Depression   . Esophageal reflux   . Heart disease   . Heart murmur   . Hx of bladder cancer 10/07/2012  . Hyperlipidemia   . Hypertension   . 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 Intestinovesical fistula   . Kidney stones    history  . Lung mass   . Multiple myeloma   . Myocardial infarction [facility])    '96  . Non Hodgkin's lymphoma (H[facility])   . Paroxysmal atrial fibrillation (H[facility]) 01/02/2016  .  Peripheral arterial disease (H[facility])   . Personal history of other diseases of circulatory system   . PONV (postoperative nausea and vomiting)   . Prostate cancer ([facility]) 2000  . Shingles   . Shortness of breath   . Sleep apnea    05-02-14 cpap , not yet used- suggested settings 5  . Stroke [facility]) 2013   Speech.    Past Surgical History:  Procedure Laterality Date  . BLADDER SURGERY    . BONE MARROW TRANSPLANT  2011  . COLON SURGERY     colon resection  . COLONOSCOPY N/A 01/01/2013   Procedure: COLONOSCOPY;  Surgeon: Malissa Hippo, MD;  [facility];  Service: Endoscopy;  Laterality: N/A;  825-moved to [address] [place] notified pt  . CORONARY ANGIOPLASTY  06/24/2000   PCI and stenting in mid & proximal RCA  . heart stents x 5  1999  . INGUINAL HERNIA REPAIR Right 05/04/2014   Procedure: OPEN RIGHT INGUINAL HERNIA REPAIR with mesh;  Surgeon: Mariella Saa, MD;  [facility];  Service: General;  Laterality: Right;  . INSERT / REPLACE / REMOVE PACEMAKER    . left ear skin cancer removed    . NM MYOCAR PERF WALL MOTION  11/27/2007   inferior scar  . PACEMAKER INSERTION  07/22/2011   [employer]tr[employer]  . PORTACATH PLACEMENT  07/26/2009   right chest  .  PROSTATE SURGERY    . Rotator    . ROTATOR CUFF REPAIR Right   . SHOULDER ARTHROSCOPY WITH SUBACROMIAL DECOMPRESSION Right 07/21/2013   Procedure: RIGHT SHOULDER ARTHROSCOPY WITH SUBACROMIAL DECOMPRESSION AND DEBRIDEMENT & Injection of Left Shoulder;  Surgeon: Alta Corning, MD;  Location: [place];  Service: Orthopedics;  Laterality: Right;  . TEE WITHOUT CARDIOVERSION  10/13/2012   Procedure: TRANSESOPHAGEAL ECHOCARDIOGRAM (TEE);  Surgeon: Sanda Klein, MD;  Location: [facility] ENDOSCOPY;  Service: Cardiovascular;  Laterality: N/A;  pat/kay/echo notified  . US ECHOCARDIOGRAPHY  06/19/2011   RV mildly dilated,mild to mod. MR,mild AI,mild PI  . WRIST SURGERY     right    There were no vitals filed for this visit.      Subjective Assessment -  06/11/17 1037    Subjective Pt states that he doesn't feel too good today. He says that his feet are hurting him.   Currently in Pain? No/denies                [facility] Adult PT Treatment/Exercise - 06/11/17 0001      Knee/Hip Exercises: Stretches   Gastroc Stretch Both;3 reps;30 seconds   Gastroc Stretch Limitations on stairs     Knee/Hip Exercises: Aerobic   Nustep x6 mins at EOS, L3, UE and LE (unbilled)     Knee/Hip Exercises: Standing   Heel Raises Both;20 reps   Heel Raises Limitations on incline for both; toe raises x 20 reps   Wall Squat 2 sets;10 reps   Wall Squat Limitations wall slides     Knee/Hip Exercises: Seated   Sit to Sand 2 sets;10 reps;without UE support  bil 5# DB         Balance Exercises - 06/11/17 1054      Balance Exercises: Standing   SLS Eyes closed;Foam/compliant surface;Intermittent upper extremity support;5 reps;10 secs  incr difficulty with RLE, CGA - min A throughout   Tandem Gait Forward;Foam/compliant surface;1 rep  over 6" and 12" hurdles   Sidestepping Foam/compliant support;2 reps  over 6" and 12" hurdles              PT Education - 06/11/17 1115    Education provided Yes   Education Details exercise technique, continue walking, will reassess next session   Person(s) Educated Patient   Methods Explanation;Demonstration   Comprehension Verbalized understanding;Returned demonstration          PT Short Term Goals - 05/19/17 1227      PT SHORT TERM GOAL #1   Title Pt will be independent and consistently perform HEP to maximize return to PLOF.   Time 2   Period Weeks   Status New     PT SHORT TERM GOAL #2   Title Pt will have improved BLE strength to 5/5 throughout in order to maximize his gait and decrease risk for falls.   Time 2   Period Weeks   Status New     PT SHORT TERM GOAL #3   Title Pt will report being able to was dishes for 20 mins or longer to demonstrate improved balance, endurance, and improved  tolerance to standing to maximize function at home.   Time 2   Period Weeks   Status New           PT Long Term Goals - 05/19/17 1229      PT LONG TERM GOAL #1   Title Pt will have improved 6MWT to at least 1248f with no rest breaks during  the test to demonstrate improved functional strength and overall endurance in order to maximize community participation.   Time 4   Period Weeks   Status New     PT LONG TERM GOAL #2   Title Pt will have improved SLS to at least 15 seconds on BLE to demonstrate improved balance and maximize gait on uneven ground.   Time 2   Period Weeks   Status New     PT LONG TERM GOAL #3   Title Pt will report participating in a regular walking program at least 3x/week to demonstrate improved enduarance and to maximize return to PLOF.   Time 4   Period Weeks   Status New     PT LONG TERM GOAL #4   Title Pt will have improved BERG balance score to at least 52/56 to demonstrate improved overall balance and decrease risk for falls.   Time 4   Period Weeks   Status New               Plan - 06/11/17 1115    Clinical Impression Statement Pt presented today with c/o feeling under the weather and asked to take it easy during today's session. PT educated pt that we would exercise to his tolerance today and he verbalized understanding. Pt continues to c/o foot pain so addressed this with calf stretch on steps and he reported feeling better following. Rest of session focused on functional strengthening and dynamic balance. Pt had difficulty with SLS on foam with EC and tandem gait on foam stepping over hurdles. He required CGA - min A throughout NMR for balance. Pt due for reassessment next session.   Rehab Potential Good   PT Frequency 2x / week   PT Duration 4 weeks   PT Treatment/Interventions ADLs/Self Care Home Management;DME Instruction;Gait training;Stair training;Functional mobility training;Therapeutic activities;Therapeutic exercise;Balance  training;Neuromuscular re-education;Patient/family education;Manual techniques;Passive range of motion;Energy conservation   PT Next Visit Plan reassess   PT Home Exercise Plan 7/9: bridging, STS; 7/23: 4-way ankle with BTB   Consulted and Agree with Plan of Care Patient      Patient will benefit from skilled therapeutic intervention in order to improve the following deficits and impairments:  Abnormal gait, Cardiopulmonary status limiting activity, Decreased activity tolerance, Decreased balance, Decreased endurance, Decreased strength, Difficulty walking  Visit Diagnosis: Difficulty in walking, not elsewhere classified  Unsteadiness on feet  Muscle weakness (generalized)  Other symptoms and signs involving the musculoskeletal system     Problem List Patient Active Problem List   Diagnosis Date Noted  . Long term current use of anticoagulant 12/25/2016  . COPD (chronic obstructive pulmonary disease) ([place]) 04/18/2016  . Paroxysmal atrial fibrillation ([place]) 01/02/2016  . Hypokalemia 11/03/2014  . URI (upper respiratory infection) 11/03/2014  . Weakness 11/03/2014  . Obstructive sleep apnea 08/07/2014  . Central sleep apnea 08/07/2014  . Inguinal hernia 05/04/2014  . Chest pain with moderate risk for cardiac etiology 11/25/2013  . Fever 09/13/2013  . Pneumonia 09/13/2013  . Pancytopenia ([place]) 09/13/2013  . Muscle weakness (generalized) 08/04/2013  . Status post arthroscopy of shoulder 08/04/2013  . Pain in joint, shoulder region 08/04/2013  . Rotator cuff tear arthropathy of right shoulder 07/21/2013  . Left rotator cuff tear arthropathy 07/21/2013  . Shortness of breath 07/07/2013  . Aneurysm of iliac artery ([place]) 01/26/2013  . Hyperlipidemia with target LDL less than 100 10/08/2012  . Prediabetes 10/08/2012  . Expressive aphasia 10/08/2012  . Hemiplegia affecting right dominant side ([place])  10/08/2012  . H/O cardiac pacemaker, Medtronic REVO, MRI conditional device,  placed 07/2011 for sympyomatic bradycardia 10/07/2012  . Hx of bladder cancer 10/07/2012  . 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 ASTHMA, UNSPECIFIED 03/22/2010  . Nonspecific (abnormal) findings on radiological and other examination of body structure 03/22/2010  . Hx of lymphoma 03/21/2010  . Multiple myeloma in remission ([place]) 03/21/2010  . Anxiety state 03/21/2010  . Essential hypertension 03/21/2010  . MYOCARDIAL INFARCTION 03/21/2010  . Coronary atherosclerosis 03/21/2010  . NEPHROLITHIASIS 03/21/2010  . ELEVATED PROSTATE SPECIFIC ANTIGEN 03/21/2010  . ROTATOR CUFF REPAIR, RIGHT, HX OF 03/21/2010     Geraldine Solar PT, DPT  [facility] [address] [place], [place], [address] Phone: [phone]   Fax:  [phone]  Name: NAYAN PROCH MRN: [id number] Date of Birth: 02-17-1941

## 2017-06-12 ENCOUNTER — Encounter (HOSPITAL_COMMUNITY): Payer: Medicare Other | Attending: Oncology

## 2017-06-12 ENCOUNTER — Encounter (HOSPITAL_COMMUNITY): Payer: Self-pay

## 2017-06-12 ENCOUNTER — Ambulatory Visit (HOSPITAL_COMMUNITY): Payer: Medicare Other | Admitting: Adult Health

## 2017-06-12 VITALS — BP 131/78 | HR 59 | Temp 97.9°F | Resp 16 | Wt 192.6 lb

## 2017-06-12 DIAGNOSIS — Z85828 Personal history of other malignant neoplasm of skin: Secondary | ICD-10-CM | POA: Insufficient documentation

## 2017-06-12 DIAGNOSIS — E538 Deficiency of other specified B group vitamins: Secondary | ICD-10-CM | POA: Diagnosis not present

## 2017-06-12 DIAGNOSIS — D801 Nonfamilial hypogammaglobulinemia: Secondary | ICD-10-CM | POA: Insufficient documentation

## 2017-06-12 DIAGNOSIS — I480 Paroxysmal atrial fibrillation: Secondary | ICD-10-CM | POA: Insufficient documentation

## 2017-06-12 DIAGNOSIS — F329 Major depressive disorder, single episode, unspecified: Secondary | ICD-10-CM | POA: Insufficient documentation

## 2017-06-12 DIAGNOSIS — Z955 Presence of coronary angioplasty implant and graft: Secondary | ICD-10-CM | POA: Insufficient documentation

## 2017-06-12 DIAGNOSIS — J449 Chronic obstructive pulmonary disease, unspecified: Secondary | ICD-10-CM | POA: Diagnosis not present

## 2017-06-12 DIAGNOSIS — Z8679 Personal history of other diseases of the circulatory system: Secondary | ICD-10-CM | POA: Diagnosis not present

## 2017-06-12 DIAGNOSIS — E785 Hyperlipidemia, unspecified: Secondary | ICD-10-CM | POA: Diagnosis not present

## 2017-06-12 DIAGNOSIS — K219 Gastro-esophageal reflux disease without esophagitis: Secondary | ICD-10-CM | POA: Diagnosis not present

## 2017-06-12 DIAGNOSIS — Z950 Presence of cardiac pacemaker: Secondary | ICD-10-CM | POA: Insufficient documentation

## 2017-06-12 DIAGNOSIS — N321 Vesicointestinal fistula: Secondary | ICD-10-CM | POA: Diagnosis not present

## 2017-06-12 DIAGNOSIS — Z87442 Personal history of urinary calculi: Secondary | ICD-10-CM | POA: Diagnosis not present

## 2017-06-12 DIAGNOSIS — I739 Peripheral vascular disease, unspecified: Secondary | ICD-10-CM | POA: Insufficient documentation

## 2017-06-12 DIAGNOSIS — Z8249 Family history of ischemic heart disease and other diseases of the circulatory system: Secondary | ICD-10-CM | POA: Insufficient documentation

## 2017-06-12 DIAGNOSIS — Z79899 Other long term (current) drug therapy: Secondary | ICD-10-CM | POA: Diagnosis not present

## 2017-06-12 DIAGNOSIS — Z8371 Family history of colonic polyps: Secondary | ICD-10-CM | POA: Insufficient documentation

## 2017-06-12 DIAGNOSIS — Z8551 Personal history of malignant neoplasm of bladder: Secondary | ICD-10-CM | POA: Diagnosis not present

## 2017-06-12 DIAGNOSIS — Z8673 Personal history of transient ischemic attack (TIA), and cerebral infarction without residual deficits: Secondary | ICD-10-CM | POA: Insufficient documentation

## 2017-06-12 DIAGNOSIS — Z8546 Personal history of malignant neoplasm of prostate: Secondary | ICD-10-CM | POA: Diagnosis not present

## 2017-06-12 DIAGNOSIS — I252 Old myocardial infarction: Secondary | ICD-10-CM | POA: Diagnosis not present

## 2017-06-12 DIAGNOSIS — I2510 Atherosclerotic heart disease of native coronary artery without angina pectoris: Secondary | ICD-10-CM | POA: Insufficient documentation

## 2017-06-12 DIAGNOSIS — G4730 Sleep apnea, unspecified: Secondary | ICD-10-CM | POA: Insufficient documentation

## 2017-06-12 DIAGNOSIS — I110 Hypertensive heart disease with heart failure: Secondary | ICD-10-CM | POA: Diagnosis not present

## 2017-06-12 DIAGNOSIS — N320 Bladder-neck obstruction: Secondary | ICD-10-CM | POA: Insufficient documentation

## 2017-06-12 DIAGNOSIS — I509 Heart failure, unspecified: Secondary | ICD-10-CM | POA: Diagnosis not present

## 2017-06-12 DIAGNOSIS — Z8052 Family history of malignant neoplasm of bladder: Secondary | ICD-10-CM | POA: Insufficient documentation

## 2017-06-12 DIAGNOSIS — Z888 Allergy status to other drugs, medicaments and biological substances status: Secondary | ICD-10-CM | POA: Insufficient documentation

## 2017-06-12 DIAGNOSIS — C9001 Multiple myeloma in remission: Secondary | ICD-10-CM

## 2017-06-12 DIAGNOSIS — Z87891 Personal history of nicotine dependence: Secondary | ICD-10-CM | POA: Insufficient documentation

## 2017-06-12 LAB — COMPREHENSIVE METABOLIC PANEL
ALT: 14 U/L — ABNORMAL LOW (ref 17–63)
AST: 21 U/L (ref 15–41)
Albumin: 3.4 g/dL — ABNORMAL LOW (ref 3.5–5.0)
Alkaline Phosphatase: 70 U/L (ref 38–126)
Anion gap: 8 (ref 5–15)
BUN: 10 mg/dL (ref 6–20)
CHLORIDE: 105 mmol/L (ref 101–111)
CO2: 25 mmol/L (ref 22–32)
CREATININE: 1.03 mg/dL (ref 0.61–1.24)
Calcium: 8.9 mg/dL (ref 8.9–10.3)
Glucose, Bld: 81 mg/dL (ref 65–99)
POTASSIUM: 3.9 mmol/L (ref 3.5–5.1)
Sodium: 138 mmol/L (ref 135–145)
TOTAL PROTEIN: 6.6 g/dL (ref 6.5–8.1)
Total Bilirubin: 0.7 mg/dL (ref 0.3–1.2)

## 2017-06-12 LAB — CBC WITH DIFFERENTIAL/PLATELET
Basophils Absolute: 0 10*3/uL (ref 0.0–0.1)
Basophils Relative: 1 %
EOS PCT: 6 %
Eosinophils Absolute: 0.2 10*3/uL (ref 0.0–0.7)
HCT: 35.3 % — ABNORMAL LOW (ref 39.0–52.0)
Hemoglobin: 11.3 g/dL — ABNORMAL LOW (ref 13.0–17.0)
LYMPHS ABS: 1.4 10*3/uL (ref 0.7–4.0)
LYMPHS PCT: 44 %
MCH: 29.1 pg (ref 26.0–34.0)
MCHC: 32 g/dL (ref 30.0–36.0)
MCV: 91 fL (ref 78.0–100.0)
MONO ABS: 0.3 10*3/uL (ref 0.1–1.0)
MONOS PCT: 9 %
Neutro Abs: 1.2 10*3/uL — ABNORMAL LOW (ref 1.7–7.7)
Neutrophils Relative %: 39 %
PLATELETS: 118 10*3/uL — AB (ref 150–400)
RBC: 3.88 MIL/uL — ABNORMAL LOW (ref 4.22–5.81)
RDW: 15.3 % (ref 11.5–15.5)
WBC: 3.2 10*3/uL — ABNORMAL LOW (ref 4.0–10.5)

## 2017-06-12 MED ORDER — CYANOCOBALAMIN 1000 MCG/ML IJ SOLN
INTRAMUSCULAR | Status: AC
  Filled 2017-06-12: qty 1

## 2017-06-12 MED ORDER — CYANOCOBALAMIN 1000 MCG/ML IJ SOLN
1000.0000 ug | Freq: Once | INTRAMUSCULAR | Status: AC
  Administered 2017-06-12: 1000 ug via INTRAMUSCULAR

## 2017-06-12 MED ORDER — IMMUNE GLOBULIN (HUMAN) 5 GM/50ML IV SOLN
400.0000 mg/kg | INTRAVENOUS | Status: DC
  Administered 2017-06-12: 35 g via INTRAVENOUS
  Filled 2017-06-12 (×2): qty 50

## 2017-06-12 MED ORDER — IMMUNE GLOBULIN (HUMAN) 10 GM/100ML IV SOLN
400.0000 mg/kg | Freq: Once | INTRAVENOUS | Status: DC
  Filled 2017-06-12: qty 400

## 2017-06-12 MED ORDER — ACETAMINOPHEN 325 MG PO TABS
ORAL_TABLET | ORAL | Status: AC
  Filled 2017-06-12: qty 2

## 2017-06-12 MED ORDER — DEXTROSE 5 % IV SOLN
INTRAVENOUS | Status: DC
  Administered 2017-06-12: 10:00:00 via INTRAVENOUS

## 2017-06-12 MED ORDER — HEPARIN SOD (PORK) LOCK FLUSH 100 UNIT/ML IV SOLN
500.0000 [IU] | Freq: Once | INTRAVENOUS | Status: AC | PRN
  Administered 2017-06-12: 500 [IU]

## 2017-06-12 MED ORDER — ACETAMINOPHEN 325 MG PO TABS
650.0000 mg | ORAL_TABLET | Freq: Once | ORAL | Status: AC
  Administered 2017-06-12: 650 mg via ORAL

## 2017-06-12 NOTE — Progress Notes (Signed)
Patient tolerated infusion today without incidence. Patient discharged ambulatory and in stable condition from clinic to self. Patient to follow up as scheduled.   

## 2017-06-12 NOTE — Patient Instructions (Signed)
[place] at [facility] Discharge Instructions  RECOMMENDATIONS MADE BY THE CONSULTANT AND ANY TEST RESULTS WILL BE SENT TO YOUR REFERRING PHYSICIAN.  You had your IVIG infusion today. Follow up as scheduled.  Thank you for choosing [place] at [facility] to provide your oncology and hematology care.  To afford each patient quality time with our provider, please arrive at least 15 minutes before your scheduled appointment time.    If you have a lab appointment with the [facility] please come in thru the  Main Entrance and check in at the main information desk  You need to re-schedule your appointment should you arrive 10 or more minutes late.  We strive to give you quality time with our providers, and arriving late affects you and other patients whose appointments are after yours.  Also, if you no show three or more times for appointments you may be dismissed from the clinic at the providers discretion.     Again, thank you for choosing [facility].  Our hope is that these requests will decrease the amount of time that you wait before being seen by our physicians.       _____________________________________________________________  Should you have questions after your visit to [facility], please contact our office at [phone] between the hours of 8:30 a.m. and 4:30 p.m.  Voicemails left after 4:30 p.m. will not be returned until the following business day.  For prescription refill requests, have your pharmacy contact our office.       Resources For Cancer Patients and their Caregivers ? American Cancer Society: Can assist with transportation, wigs, general needs, runs Look Good Feel Better.        740-886-4817 ? Cancer Care: Provides financial assistance, online support groups, medication/co-pay assistance.  1-800-813-HOPE (310) 288-4204) ? [facility] Assists [place] cancer  patients and their families through emotional , educational and financial support.  315-873-9015 ? [place] DSS Where to apply for food stamps, Medicaid and utility assistance. 825-528-5384 ? [facility]: Transportation to medical appointments. 512-040-9374 ? Social Security Administration: May apply for disability if have a Stage IV cancer. (405) 712-8182 929-724-4112 ? [employer], Disability and Transit Services: Assists with nutrition, care and transit needs. [place] Support Programs: @10RELATIVEDAYS @ > Cancer Support Group  2nd Tuesday of the month 1pm-2pm, Journey Room  > Creative Journey  3rd Tuesday of the month 1130am-1pm, Journey Room  > Look Good Feel Better  1st Wednesday of the month 10am-12 noon, Journey Room (Call [place] to register [phone])

## 2017-06-12 NOTE — Progress Notes (Signed)
Patient taking Revlemid as prescribed. Has not missed any doses & reports no side effects.

## 2017-06-16 ENCOUNTER — Ambulatory Visit (HOSPITAL_COMMUNITY): Payer: Medicare Other

## 2017-06-16 ENCOUNTER — Encounter (HOSPITAL_COMMUNITY): Payer: Self-pay

## 2017-06-16 DIAGNOSIS — R2681 Unsteadiness on feet: Secondary | ICD-10-CM

## 2017-06-16 DIAGNOSIS — R29898 Other symptoms and signs involving the musculoskeletal system: Secondary | ICD-10-CM | POA: Diagnosis not present

## 2017-06-16 DIAGNOSIS — R262 Difficulty in walking, not elsewhere classified: Secondary | ICD-10-CM

## 2017-06-16 DIAGNOSIS — M6281 Muscle weakness (generalized): Secondary | ICD-10-CM

## 2017-06-16 NOTE — Therapy (Signed)
[facility] [address] [place], [place], [address] Phone: [phone]   Fax:  [phone]  Physical Therapy Treatment/Discharge Summary  Patient Details  Name: Jeremy Parrish MRN: [id number] Date of Birth: Apr 13, 1941 Referring Provider: Baltazar Apo, MD  Encounter Date: 06/16/2017      PT End of Session - 06/16/17 1301    Visit Number 9   Number of Visits 9   Date for PT Re-Evaluation 06/16/17   Authorization Type Medicare Part A and B (Secondary: BCBS Supplemental)   Authorization Time Period 05/19/17 to 06/16/17   Authorization - Visit Number 9   Authorization - Number of Visits 10   PT Start Time 1301   PT Stop Time 1348   PT Time Calculation (min) 47 min   Equipment Utilized During Treatment Gait belt   Activity Tolerance Patient tolerated treatment well;Patient limited by fatigue   Behavior During Therapy [facility] for tasks assessed/performed      Past Medical History:  Diagnosis Date  . Anemia   . Aortic aneurysm of unspecified site without mention of rupture   . Arthritis   . Bladder neck contracture   . Cancer ([place])   . Cerebral atherosclerosis    Carotid Doppler, 02/16/2013 - Bilateral Proximal ICAs,demonstrate mild plaque w/o evidence of significant diameter reduction, dissection, or any other vascular abnormality  . CHF (congestive heart failure) ([place])   . Complication of anesthesia   . COPD (chronic obstructive pulmonary disease) ([place])   . Coronary artery disease   . Depression   . Esophageal reflux   . Heart disease   . Heart murmur   . Hx of bladder cancer 10/07/2012  . Hyperlipidemia   . Hypertension   . 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 Intestinovesical fistula   . Kidney stones    history  . Lung mass   . Multiple myeloma   . Myocardial infarction [facility])    '96  . Non Hodgkin's lymphoma ([place])   . Paroxysmal atrial fibrillation  ([place]) 01/02/2016  . Peripheral arterial disease ([place])   . Personal history of other diseases of circulatory system   . PONV (postoperative nausea and vomiting)   . Prostate cancer ([place]) 2000  . Shingles   . Shortness of breath   . Sleep apnea    05-02-14 cpap , not yet used- suggested settings 5  . Stroke [facility]) 2013   Speech.    Past Surgical History:  Procedure Laterality Date  . BLADDER SURGERY    . BONE MARROW TRANSPLANT  2011  . COLON SURGERY     colon resection  . COLONOSCOPY N/A 01/01/2013   Procedure: COLONOSCOPY;  Surgeon: Rogene Houston, MD;  Location: [facility] ENDO SUITE;  Service: Endoscopy;  Laterality: N/A;  825-moved to [place] notified pt  . CORONARY ANGIOPLASTY  06/24/2000   PCI and stenting in mid & proximal RCA  . heart stents x 5  1999  . INGUINAL HERNIA REPAIR Right 05/04/2014   Procedure: OPEN RIGHT INGUINAL HERNIA REPAIR with mesh;  Surgeon: Edward Jolly, MD;  Location: [facility] ORS;  Service: General;  Laterality: Right;  . INSERT / REPLACE / REMOVE PACEMAKER    . left ear skin cancer removed    . NM MYOCAR PERF WALL MOTION  11/27/2007   inferior scar  . PACEMAKER INSERTION  07/22/2011   Medtronic  . PORTACATH PLACEMENT  07/26/2009   right chest  .  PROSTATE SURGERY    . Rotator    . ROTATOR CUFF REPAIR Right   . SHOULDER ARTHROSCOPY WITH SUBACROMIAL DECOMPRESSION Right 07/21/2013   Procedure: RIGHT SHOULDER ARTHROSCOPY WITH SUBACROMIAL DECOMPRESSION AND DEBRIDEMENT & Injection of Left Shoulder;  Surgeon: Alta Corning, MD;  Location: [facility];  Service: Orthopedics;  Laterality: Right;  . TEE WITHOUT CARDIOVERSION  10/13/2012   Procedure: TRANSESOPHAGEAL ECHOCARDIOGRAM (TEE);  Surgeon: Sanda Klein, MD;  Location: [facility] ENDOSCOPY;  Service: Cardiovascular;  Laterality: N/A;  pat/kay/echo notified  . US ECHOCARDIOGRAPHY  06/19/2011   RV mildly dilated,mild to mod. MR,mild AI,mild PI  . WRIST SURGERY     right    There were no vitals filed for this visit.       Subjective Assessment - 06/16/17 1301    Subjective Pt states that he is tired today and both of his knees hurt/aches. He denies any pain though.   How long can you stand comfortably? 20 mins   How long can you walk comfortably? 10 mins   Patient Stated Goals get to feeling better, more strength   Currently in Pain? No/denies            [facility] PT Assessment - 06/16/17 0001      Strength   Right Hip Extension 5/5  was 4   Right Hip ABduction 5/5  was 4+   Left Hip Extension 5/5  was 4   Left Hip ABduction 5/5  was 4+     Ambulation/Gait   Ambulation/Gait Yes   Ambulation Distance (Feet) 1130 Feet  6MWT   Assistive device None   Gait Pattern Within Functional Limits  increasing trendelenberg on L as time increased     Balance   Balance Assessed Yes     Static Standing Balance   Static Standing - Balance Support No upper extremity supported   Static Standing Balance -  Activities  Single Leg Stance - Right Leg;Single Leg Stance - Left Leg   Static Standing - Comment/# of Minutes R: 7 and 15 sec, L: 4 and 7 sec     Berg Balance Test   Sit to Stand Able to stand without using hands and stabilize independently   Standing Unsupported Able to stand safely 2 minutes   Sitting with Back Unsupported but Feet Supported on Floor or Stool Able to sit safely and securely 2 minutes   Stand to Sit Sits safely with minimal use of hands   Transfers Able to transfer safely, minor use of hands   Standing Unsupported with Eyes Closed Able to stand 10 seconds safely   Standing Ubsupported with Feet Together Able to place feet together independently and stand 1 minute safely   From Standing, Reach Forward with Outstretched Arm Can reach confidently >25 cm (10")   From Standing Position, Pick up Object from Floor Able to pick up shoe safely and easily   From Standing Position, Turn to Look Behind Over each Shoulder Looks behind from both sides and weight shifts well   Turn 360 Degrees Able  to turn 360 degrees safely but slowly   Standing Unsupported, Alternately Place Feet on Step/Stool Able to stand independently and safely and complete 8 steps in 20 seconds   Standing Unsupported, One Foot in Front Able to plae foot ahead of the other independently and hold 30 seconds   Standing on One Leg Able to lift leg independently and hold 5-10 seconds   Total Score 52  [facility] Adult PT Treatment/Exercise - 06/16/17 0001      Knee/Hip Exercises: Aerobic   Nustep x7 mins at EOS, L4, UE and LE (unbilled)              PT Education - 06/16/17 1341    Education provided Yes   Education Details discharge plans, updated HEP, importance of walking program   Person(s) Educated Patient   Methods Explanation;Demonstration;Handout   Comprehension Verbalized understanding;Returned demonstration          PT Short Term Goals - 06/16/17 1305      PT SHORT TERM GOAL #1   Title Pt will be independent and consistently perform HEP to maximize return to PLOF.   Baseline 8/6: he performs his exercises 2-3x/week   Time 2   Period Weeks   Status Partially Met     PT SHORT TERM GOAL #2   Title Pt will have improved BLE strength to 5/5 throughout in order to maximize his gait and decrease risk for falls.   Time 2   Period Weeks   Status Achieved     PT SHORT TERM GOAL #3   Title Pt will report being able to was dishes for 20 mins or longer to demonstrate improved balance, endurance, and improved tolerance to standing to maximize function at home.   Baseline 8/6: he feels like he could stand for 20 mins   Time 2   Period Weeks   Status Achieved           PT Long Term Goals - 06/16/17 1307      PT LONG TERM GOAL #1   Title Pt will have improved 6MWT to at least 1269f with no rest breaks during the test to demonstrate improved functional strength and overall endurance in order to maximize community participation.   Baseline 8/6: 1130 with 1 seated rest break    Time 4   Period Weeks   Status On-going     PT LONG TERM GOAL #2   Title Pt will have improved SLS to at least 15 seconds on BLE to demonstrate improved balance and maximize gait on uneven ground.   Baseline 8/6: R: 7 and 15 sec, L: 4 and 7 sec   Time 2   Period Weeks   Status Partially Met     PT LONG TERM GOAL #3   Title Pt will report participating in a regular walking program at least 3x/week to demonstrate improved enduarance and to maximize return to PLOF.   Baseline 8/6: he states that he has been aiming to start a walking program with his wife at the senior center but they just haven't done it yet.   Time 4   Period Weeks   Status On-going     PT LONG TERM GOAL #4   Title Pt will have improved BERG balance score to at least 52/56 to demonstrate improved overall balance and decrease risk for falls.   Time 4   Period Weeks   Status Achieved               Plan - 06/16/17 1350    Clinical Impression Statement PT reassessed pt's goals and outcome measures this date. He has met 3, partially met 2, while 2 are still on going. Pt's MMT and balance have significantly improved as demo by his 5/5 MMT and 52/56 score on the BERG. His main limiting factor at this point is his endurance and SLS balance as evidenced by his 6MWT and  SLS times. Pt verbalizes that he feels 65-70% improved since starting therapy, reporting that he can tell his overall function has improved. He states the remaining 30-35% is that he does not devote his time to performing his HEP as instructed/like he should. At this time, pt will be discharged to his HEP due to progress made. He was strongly encouraged to begin a walking program and educated on the benefits of regular exercises. PT provided pt with updated HEP and he verbalized understanding. Pt was educated that if he notices a significant decline in his function then he can return with a referral. Pt verbalized understanding and was agreeable to discharge.    Rehab Potential Good   PT Frequency 2x / week   PT Duration 4 weeks   PT Treatment/Interventions ADLs/Self Care Home Management;DME Instruction;Gait training;Stair training;Functional mobility training;Therapeutic activities;Therapeutic exercise;Balance training;Neuromuscular re-education;Patient/family education;Manual techniques;Passive range of motion;Energy conservation   PT Next Visit Plan reassess   PT Home Exercise Plan 7/9: bridging, STS; 7/23: 4-way ankle with BTB; 07-02-2023: walking program, SLS    Consulted and Agree with Plan of Care Patient      Patient will benefit from skilled therapeutic intervention in order to improve the following deficits and impairments:  Abnormal gait, Cardiopulmonary status limiting activity, Decreased activity tolerance, Decreased balance, Decreased endurance, Decreased strength, Difficulty walking  Visit Diagnosis: Difficulty in walking, not elsewhere classified  Unsteadiness on feet  Muscle weakness (generalized)  Other symptoms and signs involving the musculoskeletal system       G-Codes - 07-01-17 1354    Functional Assessment Tool Used (Outpatient Only) clinical judgement, 3MWT, SLS   Functional Limitation Mobility: Walking and moving around   Mobility: Walking and Moving Around Current Status (R0076) At least 1 percent but less than 20 percent impaired, limited or restricted   Mobility: Walking and Moving Around Discharge Status [phone]) At least 1 percent but less than 20 percent impaired, limited or restricted      Problem List Patient Active Problem List   Diagnosis Date Noted  . Long term current use of anticoagulant 12/25/2016  . COPD (chronic obstructive pulmonary disease) ([place]) 04/18/2016  . Paroxysmal atrial fibrillation ([place]) 01/02/2016  . Hypokalemia 11/03/2014  . URI (upper respiratory infection) 11/03/2014  . Weakness 11/03/2014  . Obstructive sleep apnea 08/07/2014  . Central sleep apnea 08/07/2014  . Inguinal hernia  05/04/2014  . Chest pain with moderate risk for cardiac etiology 11/25/2013  . Fever 09/13/2013  . Pneumonia 09/13/2013  . Pancytopenia ([place]) 09/13/2013  . Muscle weakness (generalized) 08/04/2013  . Status post arthroscopy of shoulder 08/04/2013  . Pain in joint, shoulder region 08/04/2013  . Rotator cuff tear arthropathy of right shoulder 07/21/2013  . Left rotator cuff tear arthropathy 07/21/2013  . Shortness of breath 07/07/2013  . 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 Hyperlipidemia with target LDL less than 100 10/08/2012  . Prediabetes 10/08/2012  . Expressive aphasia 10/08/2012  . Hemiplegia affecting right dominant side ([place]) 10/08/2012  . 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 Hx of bladder cancer 10/07/2012  . 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 ASTHMA, UNSPECIFIED 03/22/2010  . Nonspecific (abnormal) findings on radiological and other examination of body structure 03/22/2010  . Hx of lymphoma 03/21/2010  . Multiple myeloma in remission ([place]) 03/21/2010  . Anxiety state 03/21/2010  . Essential hypertension 03/21/2010  . MYOCARDIAL INFARCTION 03/21/2010  .  Coronary atherosclerosis 03/21/2010  . NEPHROLITHIASIS 03/21/2010  . ELEVATED PROSTATE SPECIFIC ANTIGEN 03/21/2010  . ROTATOR CUFF REPAIR, RIGHT, HX OF 03/21/2010      PHYSICAL THERAPY DISCHARGE SUMMARY  Visits from Start of Care: 9  Current functional level related to goals / functional outcomes: See clinical impression above   Remaining deficits: See clinical impression above   Education / Equipment: Walking program, SLS  Plan: Patient agrees to discharge.  Patient goals were partially met. Patient is being discharged due to meeting the stated rehab goals.  ?????       Geraldine Solar PT, [facility] [address] [place], [place], [address] Phone: [phone]   Fax:  [phone]  Name: Jeremy Parrish MRN: [id number] Date of Birth: [birth year]

## 2017-06-16 NOTE — Patient Instructions (Addendum)
  WALKING  Start a walking program. Just do what you can do to start -- if you can only do 5 minutes at first, then that's fine. Take a break and walk another 5 minutes and repeat that for as long as you feel like you can. Do that for a few days and then increase to trying to walk for 7 or 8 mins, etc. Just listen to your body as you do this. Steadily build up your endurance to where you can walk for 20-30 minutes straight.  Walk at least 3x/week, steadily building up your endurance.    SINGLE LEG STANCE - SLS  Stand on one leg and maintain your balance.  The ultimate goal is to be able to stand on one leg for 30 seconds, but at least get your left leg equal to the right.   Hold for as long as you can each attempt.  Perform this 1x/day, 1-2 sets of 10-15 reps on each leg.

## 2017-06-30 ENCOUNTER — Encounter: Payer: Medicare Other | Admitting: *Deleted

## 2017-06-30 ENCOUNTER — Other Ambulatory Visit (HOSPITAL_COMMUNITY): Payer: Self-pay | Admitting: *Deleted

## 2017-06-30 ENCOUNTER — Telehealth: Payer: Self-pay | Admitting: *Deleted

## 2017-06-30 DIAGNOSIS — C9001 Multiple myeloma in remission: Secondary | ICD-10-CM

## 2017-06-30 MED ORDER — LENALIDOMIDE 10 MG PO CAPS: ORAL_CAPSULE | ORAL | 0 refills | Status: DC

## 2017-06-30 NOTE — Telephone Encounter (Signed)
Following up    Did not get a call back from his message earlier please call

## 2017-06-30 NOTE — Telephone Encounter (Signed)
°  1. Has your device fired? no ° °2. Is you device beeping? no ° °3. Are you experiencing draining or swelling at device site? no ° °4. Are you calling to see if we received your device transmission? yes ° °5. Have you passed out? no ° °

## 2017-06-30 NOTE — Telephone Encounter (Signed)
LMOVM and LMOM (DPR) advising that transmission was not received.  Advised that if patient is still trying to send using landline phone, he will need to switch to his [place] adapter ordered in 11/2014 (per [employer] website).  [facility] phone number for additional assistance.

## 2017-06-30 NOTE — Telephone Encounter (Signed)
Spoke with pt, pt stated that he got cell service at this house, ordered pt a [id number] home monitor. Informed pt that it could take up to 14 business days to receive informed pt to call device clinic when re received the monitor.

## 2017-07-02 IMAGING — DX DG CHEST 2V
2 series · 2 of 2 positions shown · non-contrast
Comparison: March 19, 2016

CLINICAL DATA: Shortness of breath and cough for several weeks.
Fever for 1 day

EXAM:
CHEST  2 VIEW

[chest pa]
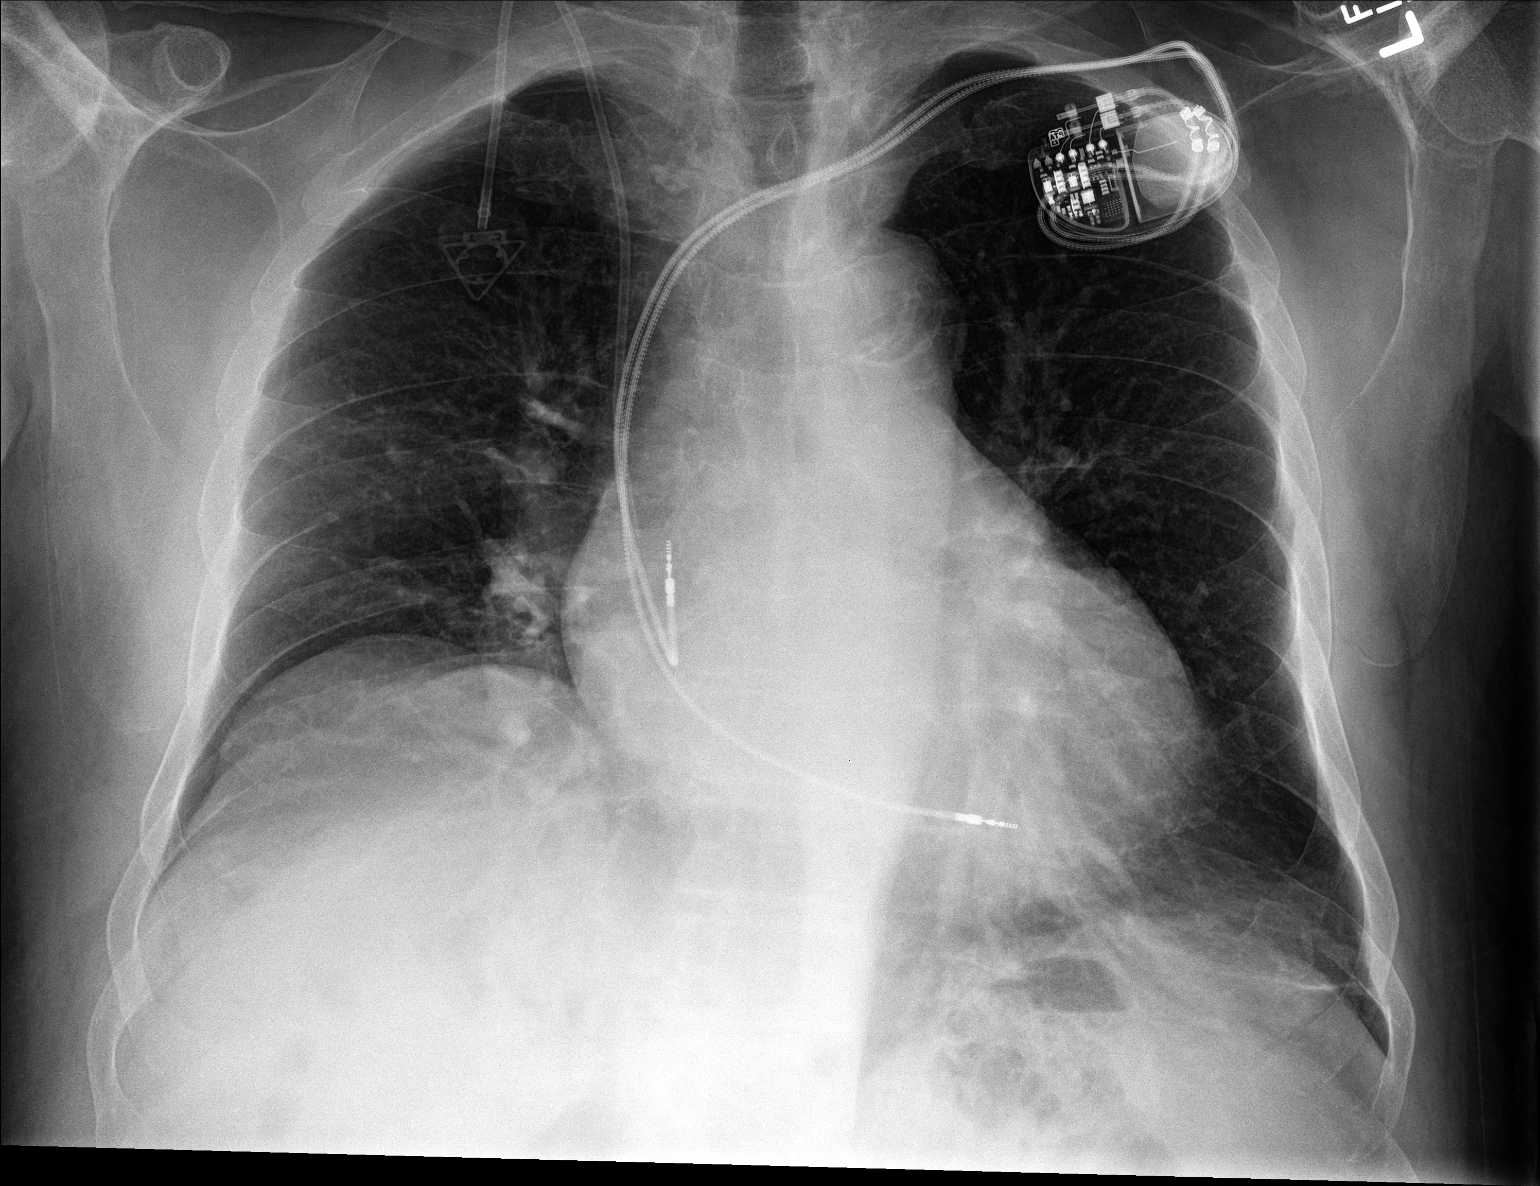

[chest lat]
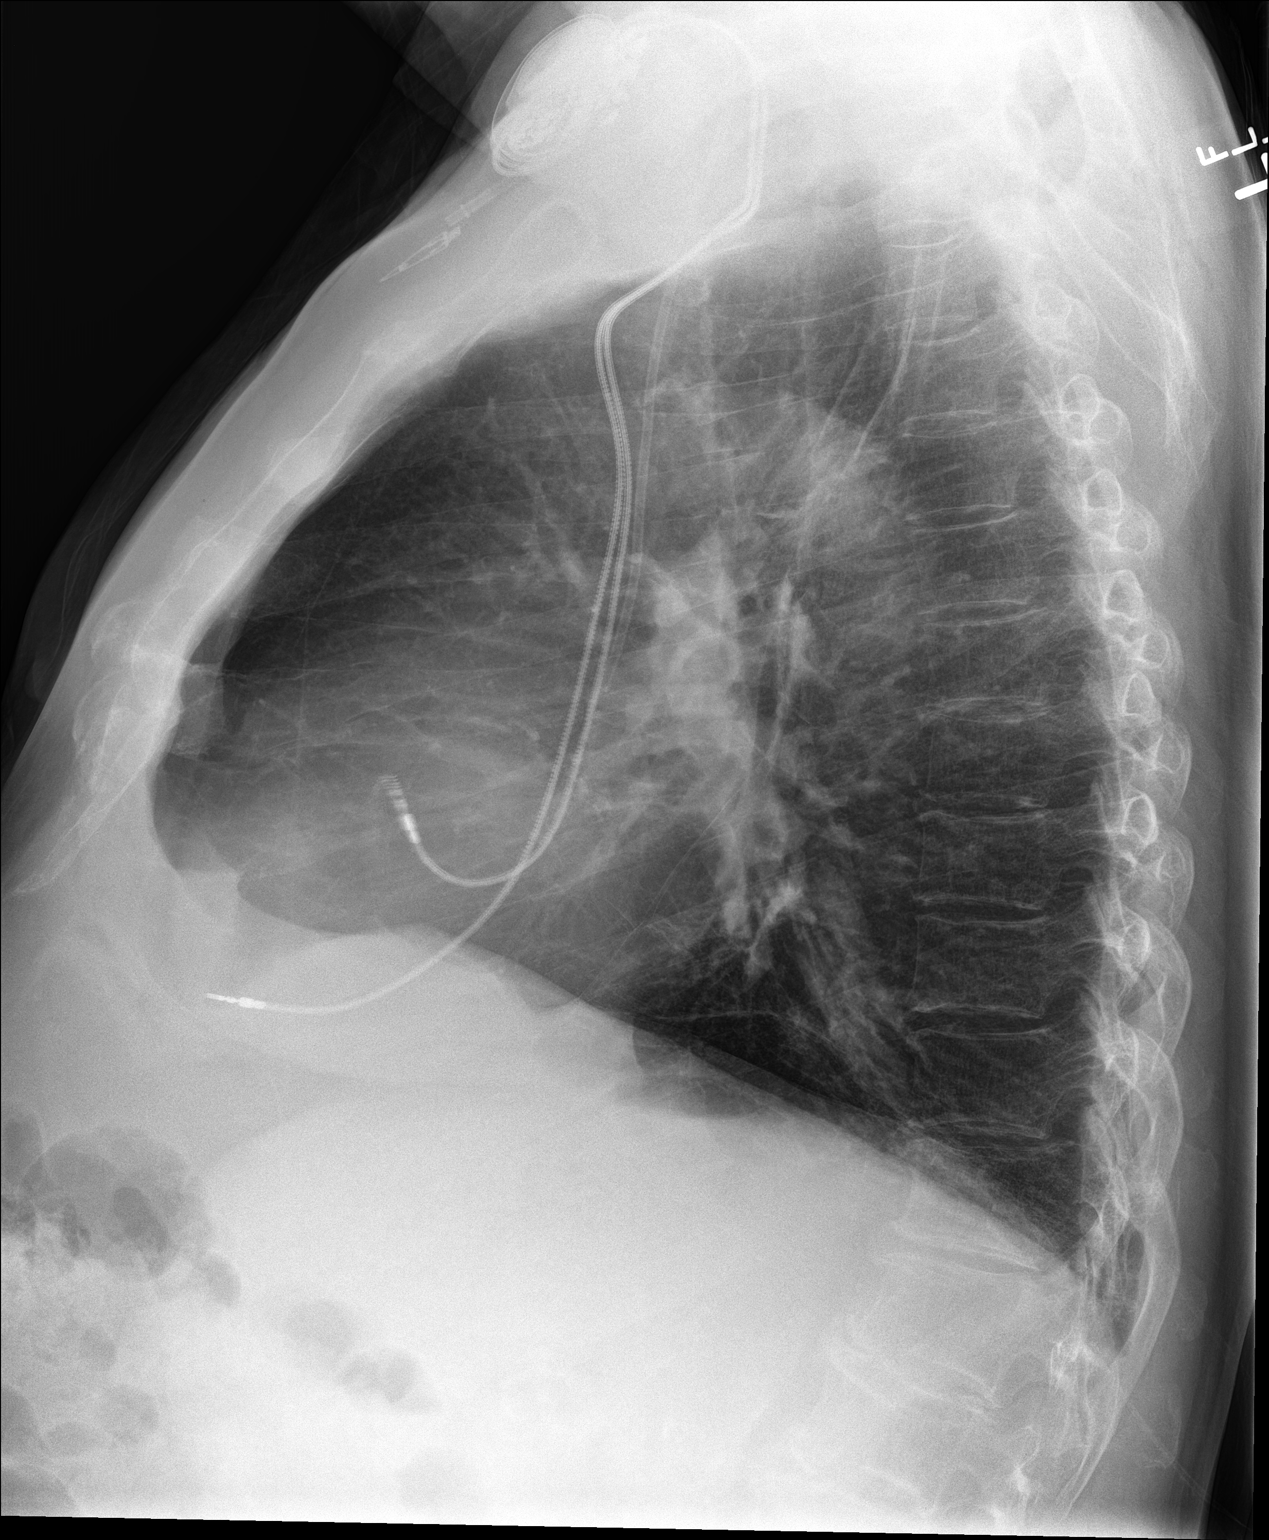

[2 of 2 positions shown; findings below may reference images not displayed]

FINDINGS: There is stable scarring in the left base. There is no edema or
consolidation. There is eventration of the right hemidiaphragm.
Heart is borderline enlarged with pulmonary vascularity within
normal limits. Pacemaker leads are attached to the right atrium and
right ventricle. No adenopathy. Port-A-Cath tip is in the superior
vena cava. No pneumothorax. There is degenerative change in the
lower thoracic and upper lumbar spine regions.
IMPRESSION: Scarring left base. No edema or consolidation. Heart borderline
prominent.

## 2017-07-02 IMAGING — CT CT ANGIO CHEST
2 of 6 series · 18 of 46 positions shown · IV contrast (ISOVUE)
Comparison: Chest radiograph earlier today.

CLINICAL DATA: Low grade fever with shortness of breath. History of
non-Hodgkin's lymphoma.

EXAM:
CT ANGIOGRAPHY CHEST WITH CONTRAST
TECHNIQUE: Multidetector CT imaging of the chest was performed using the
standard protocol during bolus administration of intravenous
contrast. Multiplanar CT image reconstructions and MIPs were
obtained to evaluate the vascular anatomy.
CONTRAST:  100 mL Isovue 370.

[Series 5: pe thins 1.0 · axial · 0.71mm/px · z∈[-258,-16]mm · 15 of 271 slices shown]
[im 14/271  lung]
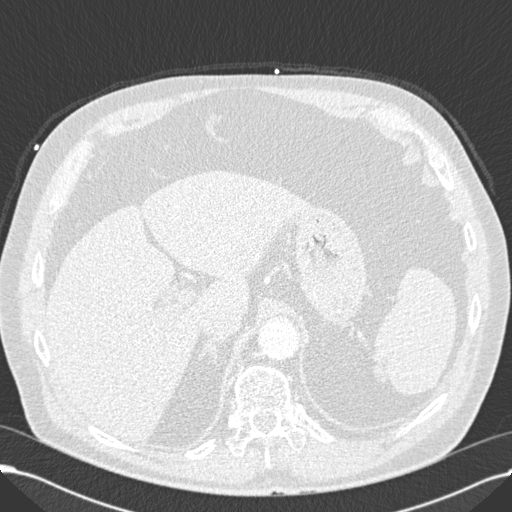
[im 28/271  soft-tissue]
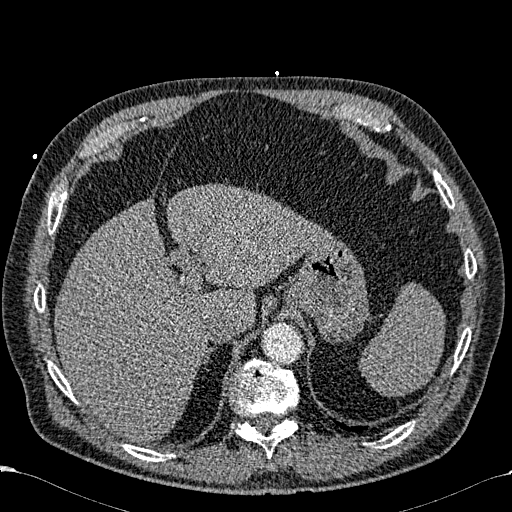
[im 55/271  lung]
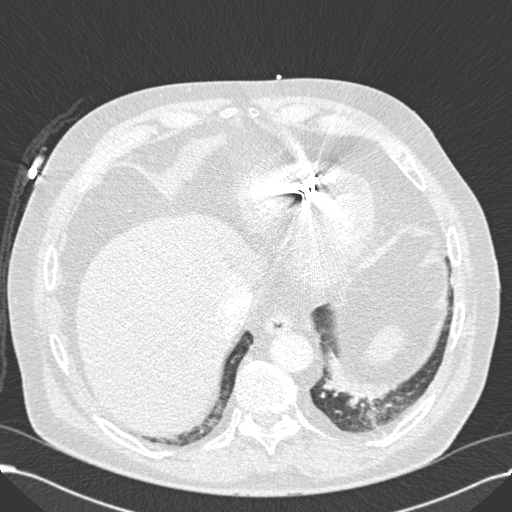
[im 68/271  soft-tissue]
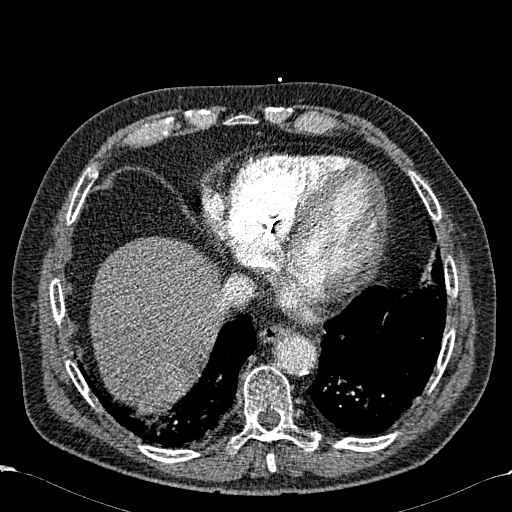
[im 82/271  lung]
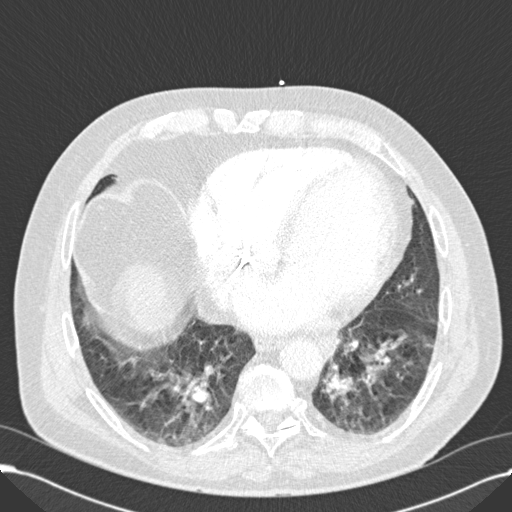
[im 95/271  soft-tissue]
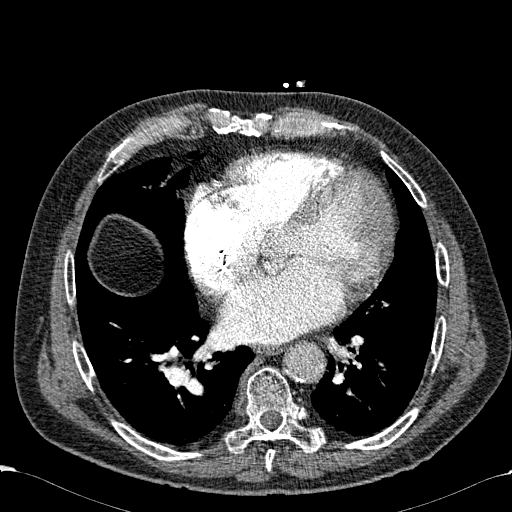
[im 122/271  lung]
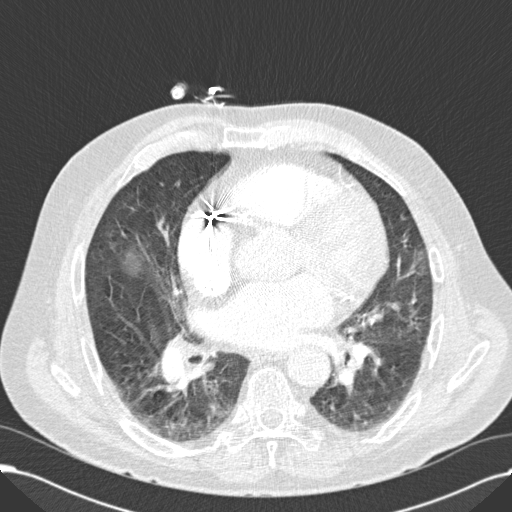
[im 136/271  soft-tissue]
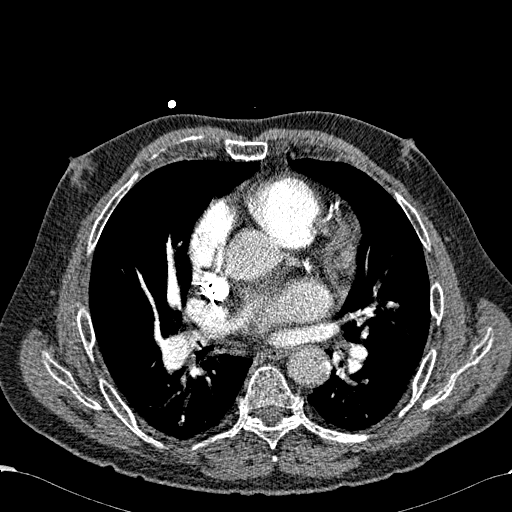
[im 149/271  lung]
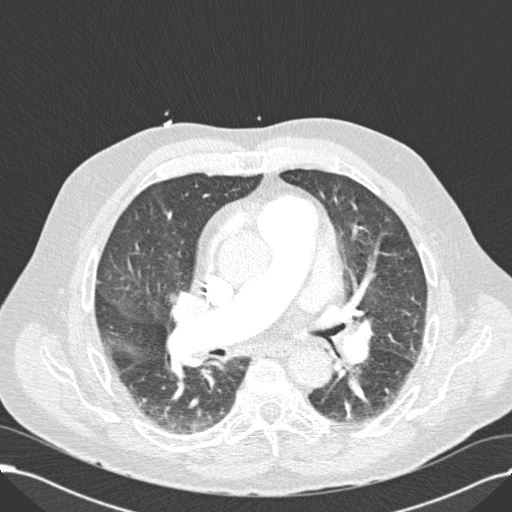
[im 176/271  soft-tissue]
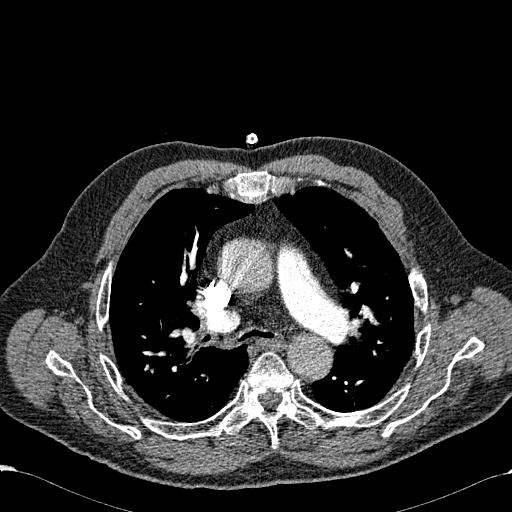
[im 190/271  lung]
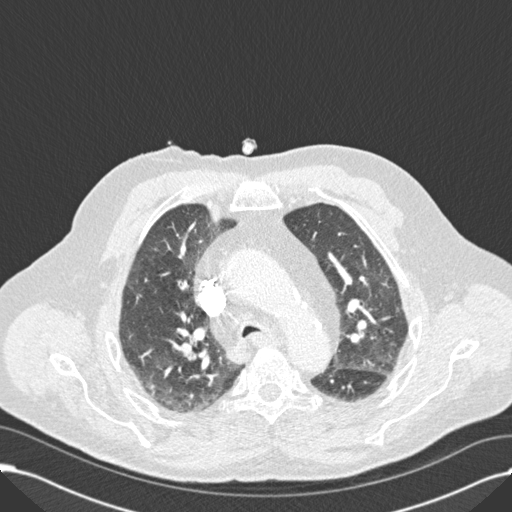
[im 203/271  soft-tissue]
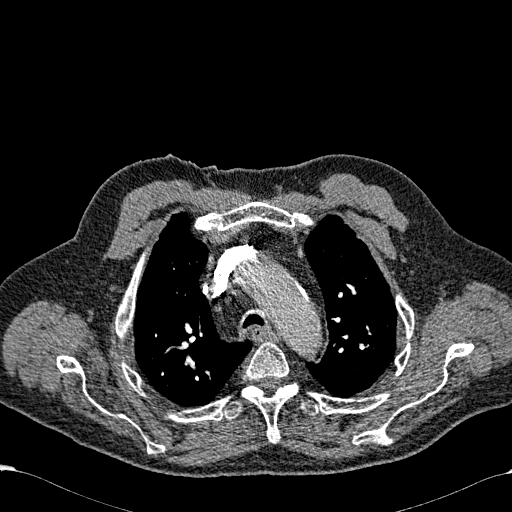
[im 217/271  lung]
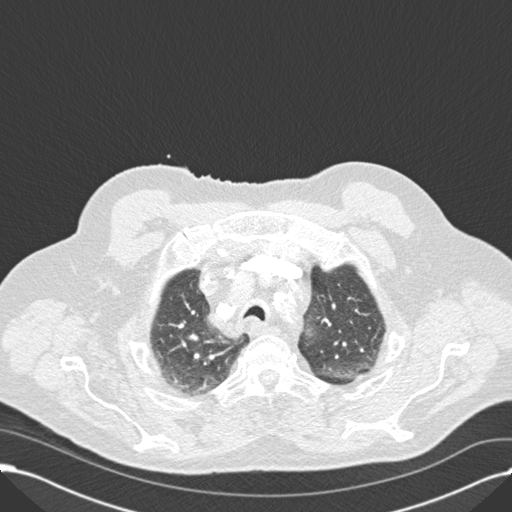
[im 244/271  soft-tissue]
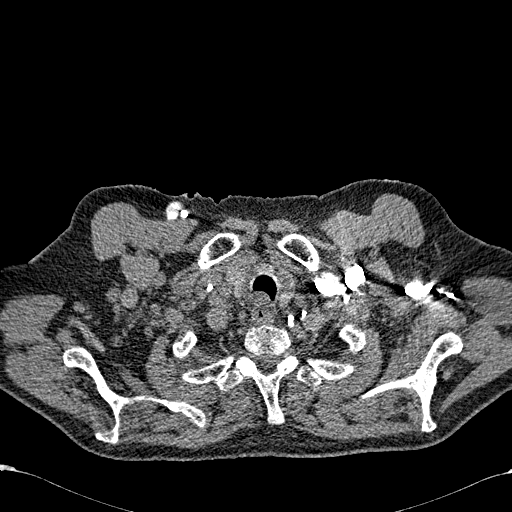
[im 257/271  lung]
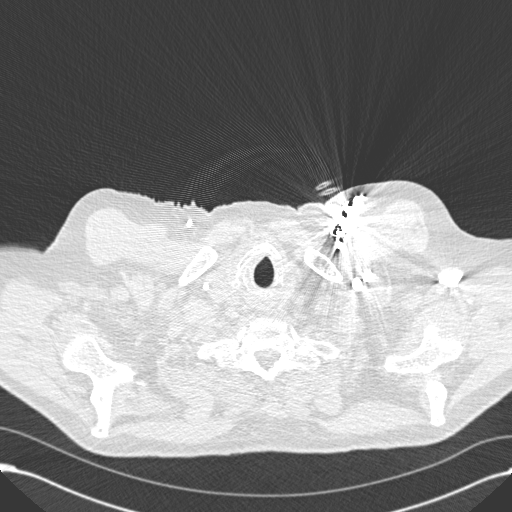

[Series 7: cor mpr 2.0 · coronal · 0.53mm/px · 3 of 157 slices shown]
[im 40/157  soft-tissue]
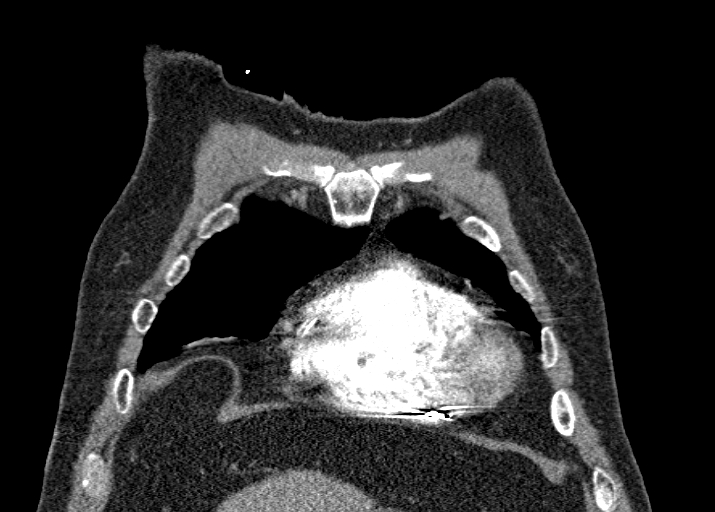
[im 79/157  soft-tissue]
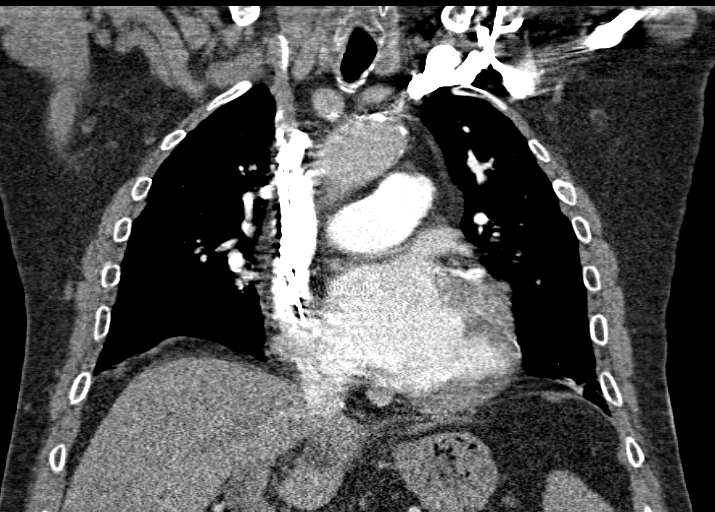
[im 118/157  soft-tissue]
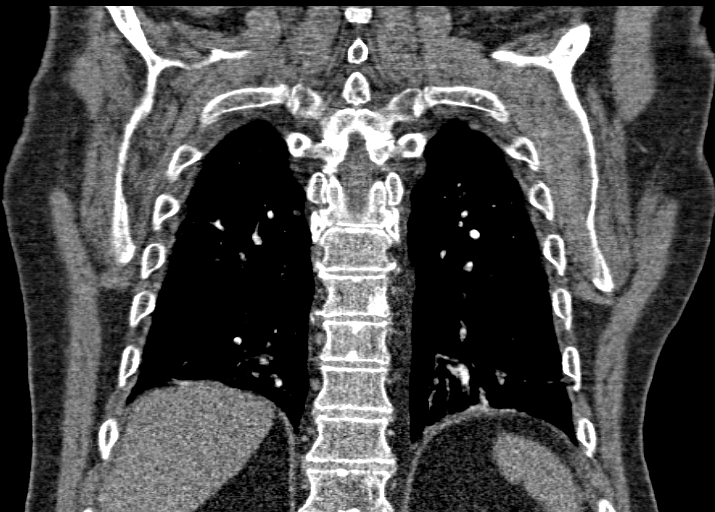

[18 of 46 positions shown; findings below may reference images not displayed]

FINDINGS: Mild degradation due to respiratory artifact.

Mediastinum/Lymph Nodes: No pulmonary emboli or thoracic aortic
dissection identified. No masses or pathologically enlarged lymph
nodes identified. Cardiomegaly. Three vessel coronary artery
calcification. Dual lead pacemaker. Unremarkable appearing
Port-A-Cath. No worrisome mediastinal or hilar adenopathy.

Lungs/Pleura: No pulmonary mass, infiltrate, or effusion. Mild
bibasilar scarring/subsegmental atelectasis.

Upper abdomen: No acute findings.

Musculoskeletal: No chest wall mass or suspicious bone lesions
identified.

Review of the MIP images confirms the above findings.
IMPRESSION: No evidence for pulmonary emboli.

Cardiomegaly with 3 vessel coronary artery calcification.

No pulmonary consolidation or significant effusion.

## 2017-07-03 ENCOUNTER — Encounter: Payer: Self-pay | Admitting: Cardiology

## 2017-07-07 ENCOUNTER — Telehealth: Payer: Self-pay | Admitting: Cardiovascular Disease

## 2017-07-07 ENCOUNTER — Ambulatory Visit (INDEPENDENT_AMBULATORY_CARE_PROVIDER_SITE_OTHER): Payer: Medicare Other | Admitting: *Deleted

## 2017-07-07 DIAGNOSIS — I495 Sick sinus syndrome: Secondary | ICD-10-CM | POA: Diagnosis not present

## 2017-07-07 NOTE — Telephone Encounter (Signed)
Walked pt through sending a manual transmission. Transmission successful.

## 2017-07-07 NOTE — Telephone Encounter (Signed)
F/u message  Pt call requesting to speak with RN. He states he is returning call from 8/20 about his new device he received  in the mail. Please call back to discuss

## 2017-07-08 DIAGNOSIS — S01301A Unspecified open wound of right ear, initial encounter: Secondary | ICD-10-CM | POA: Diagnosis not present

## 2017-07-08 LAB — CUP PACEART REMOTE DEVICE CHECK
Battery Voltage: 2.96 V
Brady Statistic AP VP Percent: 1.42 %
Brady Statistic AP VS Percent: 93.63 %
Brady Statistic AS VP Percent: 0.04 %
Brady Statistic AS VS Percent: 4.91 %
Brady Statistic RV Percent Paced: 1.55 %
Date Time Interrogation Session: 20180827174509
Implantable Lead Implant Date: 20120910
Implantable Lead Implant Date: 20120910
Implantable Lead Location: 753859
Lead Channel Impedance Value: 400 Ohm
Lead Channel Setting Sensing Sensitivity: 0.9 mV
MDC IDC LEAD LOCATION: 753860
MDC IDC MSMT LEADCHNL RA IMPEDANCE VALUE: 400 Ohm
MDC IDC MSMT LEADCHNL RA SENSING INTR AMPL: 1.641 mV
MDC IDC MSMT LEADCHNL RV SENSING INTR AMPL: 4.04 mV
MDC IDC PG IMPLANT DT: 20120910
MDC IDC SET LEADCHNL RA PACING AMPLITUDE: 2 V
MDC IDC SET LEADCHNL RV PACING AMPLITUDE: 2.5 V
MDC IDC SET LEADCHNL RV PACING PULSEWIDTH: 0.6 ms
MDC IDC STAT BRADY RA PERCENT PACED: 90.33 %

## 2017-07-08 NOTE — Progress Notes (Signed)
Remote pacemaker transmission.   

## 2017-07-10 ENCOUNTER — Ambulatory Visit (HOSPITAL_COMMUNITY): Payer: Medicare Other

## 2017-07-10 ENCOUNTER — Encounter (HOSPITAL_COMMUNITY): Payer: Self-pay

## 2017-07-10 ENCOUNTER — Other Ambulatory Visit (HOSPITAL_COMMUNITY): Payer: Medicare Other

## 2017-07-10 ENCOUNTER — Encounter (HOSPITAL_BASED_OUTPATIENT_CLINIC_OR_DEPARTMENT_OTHER): Payer: Medicare Other | Admitting: Adult Health

## 2017-07-10 ENCOUNTER — Encounter (HOSPITAL_BASED_OUTPATIENT_CLINIC_OR_DEPARTMENT_OTHER): Payer: Medicare Other

## 2017-07-10 ENCOUNTER — Encounter (HOSPITAL_COMMUNITY): Payer: Medicare Other

## 2017-07-10 VITALS — BP 126/66 | HR 58 | Temp 97.6°F | Resp 20

## 2017-07-10 VITALS — BP 145/86 | HR 73 | Temp 97.8°F | Resp 18 | Wt 195.4 lb

## 2017-07-10 DIAGNOSIS — E538 Deficiency of other specified B group vitamins: Secondary | ICD-10-CM | POA: Diagnosis not present

## 2017-07-10 DIAGNOSIS — J449 Chronic obstructive pulmonary disease, unspecified: Secondary | ICD-10-CM | POA: Diagnosis not present

## 2017-07-10 DIAGNOSIS — N320 Bladder-neck obstruction: Secondary | ICD-10-CM | POA: Diagnosis not present

## 2017-07-10 DIAGNOSIS — C9001 Multiple myeloma in remission: Secondary | ICD-10-CM | POA: Diagnosis not present

## 2017-07-10 DIAGNOSIS — Z8572 Personal history of non-Hodgkin lymphomas: Secondary | ICD-10-CM

## 2017-07-10 DIAGNOSIS — M2550 Pain in unspecified joint: Secondary | ICD-10-CM

## 2017-07-10 DIAGNOSIS — G629 Polyneuropathy, unspecified: Secondary | ICD-10-CM

## 2017-07-10 DIAGNOSIS — D801 Nonfamilial hypogammaglobulinemia: Secondary | ICD-10-CM | POA: Diagnosis not present

## 2017-07-10 DIAGNOSIS — Z9484 Stem cells transplant status: Secondary | ICD-10-CM

## 2017-07-10 DIAGNOSIS — I110 Hypertensive heart disease with heart failure: Secondary | ICD-10-CM | POA: Diagnosis not present

## 2017-07-10 DIAGNOSIS — I509 Heart failure, unspecified: Secondary | ICD-10-CM | POA: Diagnosis not present

## 2017-07-10 LAB — CBC WITH DIFFERENTIAL/PLATELET
BASOS ABS: 0 10*3/uL (ref 0.0–0.1)
BASOS PCT: 1 %
Eosinophils Absolute: 0.2 10*3/uL (ref 0.0–0.7)
Eosinophils Relative: 4 %
HEMATOCRIT: 35.3 % — AB (ref 39.0–52.0)
HEMOGLOBIN: 11.5 g/dL — AB (ref 13.0–17.0)
LYMPHS PCT: 46 %
Lymphs Abs: 1.7 10*3/uL (ref 0.7–4.0)
MCH: 29.1 pg (ref 26.0–34.0)
MCHC: 32.6 g/dL (ref 30.0–36.0)
MCV: 89.4 fL (ref 78.0–100.0)
MONO ABS: 0.4 10*3/uL (ref 0.1–1.0)
MONOS PCT: 10 %
NEUTROS ABS: 1.4 10*3/uL — AB (ref 1.7–7.7)
NEUTROS PCT: 39 %
Platelets: 111 10*3/uL — ABNORMAL LOW (ref 150–400)
RBC: 3.95 MIL/uL — ABNORMAL LOW (ref 4.22–5.81)
RDW: 15.7 % — AB (ref 11.5–15.5)
WBC: 3.6 10*3/uL — ABNORMAL LOW (ref 4.0–10.5)

## 2017-07-10 LAB — COMPREHENSIVE METABOLIC PANEL
ALBUMIN: 3.5 g/dL (ref 3.5–5.0)
ALK PHOS: 67 U/L (ref 38–126)
ALT: 16 U/L — ABNORMAL LOW (ref 17–63)
AST: 24 U/L (ref 15–41)
Anion gap: 9 (ref 5–15)
BILIRUBIN TOTAL: 0.6 mg/dL (ref 0.3–1.2)
BUN: 9 mg/dL (ref 6–20)
CALCIUM: 9 mg/dL (ref 8.9–10.3)
CO2: 24 mmol/L (ref 22–32)
CREATININE: 1.02 mg/dL (ref 0.61–1.24)
Chloride: 106 mmol/L (ref 101–111)
GFR calc Af Amer: >60 mL/min (ref >60)
GLUCOSE: 117 mg/dL — AB (ref 65–99)
Potassium: 3.9 mmol/L (ref 3.5–5.1)
Sodium: 139 mmol/L (ref 135–145)
TOTAL PROTEIN: 6.5 g/dL (ref 6.5–8.1)

## 2017-07-10 LAB — LACTATE DEHYDROGENASE: LDH: 129 U/L (ref 98–192)

## 2017-07-10 MED ORDER — CYANOCOBALAMIN 1000 MCG/ML IJ SOLN
INTRAMUSCULAR | Status: AC
  Filled 2017-07-10: qty 1

## 2017-07-10 MED ORDER — ACETAMINOPHEN 325 MG PO TABS
650.0000 mg | ORAL_TABLET | Freq: Once | ORAL | Status: AC
  Administered 2017-07-10: 650 mg via ORAL

## 2017-07-10 MED ORDER — ACETAMINOPHEN 325 MG PO TABS
ORAL_TABLET | ORAL | Status: AC
  Filled 2017-07-10: qty 2

## 2017-07-10 MED ORDER — SODIUM CHLORIDE 0.9 % IV SOLN: Freq: Once | INTRAVENOUS | Status: DC

## 2017-07-10 MED ORDER — CYANOCOBALAMIN 1000 MCG/ML IJ SOLN
1000.0000 ug | Freq: Once | INTRAMUSCULAR | Status: AC
  Administered 2017-07-10: 1000 ug via INTRAMUSCULAR

## 2017-07-10 MED ORDER — HYDROCODONE-ACETAMINOPHEN 5-325 MG PO TABS: 1.0000 | ORAL_TABLET | Freq: Four times a day (QID) | ORAL | 0 refills | Status: DC | PRN

## 2017-07-10 MED ORDER — SODIUM CHLORIDE 0.9 % IJ SOLN
10.0000 mL | INTRAMUSCULAR | Status: DC | PRN
  Administered 2017-07-10: 10 mL
  Filled 2017-07-10: qty 10

## 2017-07-10 MED ORDER — HEPARIN SOD (PORK) LOCK FLUSH 100 UNIT/ML IV SOLN
500.0000 [IU] | Freq: Once | INTRAVENOUS | Status: AC | PRN
  Administered 2017-07-10: 500 [IU]

## 2017-07-10 MED ORDER — DEXTROSE 5 % IV SOLN
INTRAVENOUS | Status: DC
Start: 2017-07-10 — End: 2017-07-10
  Administered 2017-07-10: 10:00:00 via INTRAVENOUS

## 2017-07-10 MED ORDER — IMMUNE GLOBULIN (HUMAN) 20 GM/200ML IV SOLN
400.0000 mg/kg | Freq: Once | INTRAVENOUS | Status: AC
  Administered 2017-07-10: 35 g via INTRAVENOUS
  Filled 2017-07-10: qty 200

## 2017-07-10 NOTE — Progress Notes (Signed)
Patient tolerated treatment without incidence. Patient discharged ambulatory and in stable condition from clinic.  Patient to follow up as scheduled. 

## 2017-07-10 NOTE — Patient Instructions (Signed)
[place] at [facility] Discharge Instructions  RECOMMENDATIONS MADE BY THE CONSULTANT AND ANY TEST RESULTS WILL BE SENT TO YOUR REFERRING PHYSICIAN.  You received your IVIG treatment today You also received your B-12 injection today, continue to get it monthly Follow up as scheduled.  Thank you for choosing [facility] at [facility] to provide your oncology and hematology care.  To afford each patient quality time with our provider, please arrive at least 15 minutes before your scheduled appointment time.    If you have a lab appointment with the [facility] please come in thru the  Main Entrance and check in at the main information desk  You need to re-schedule your appointment should you arrive 10 or more minutes late.  We strive to give you quality time with our providers, and arriving late affects you and other patients whose appointments are after yours.  Also, if you no show three or more times for appointments you may be dismissed from the clinic at the providers discretion.     Again, thank you for choosing [facility].  Our hope is that these requests will decrease the amount of time that you wait before being seen by our physicians.       _____________________________________________________________  Should you have questions after your visit to [facility], please contact our office at [phone] between the hours of 8:30 a.m. and 4:30 p.m.  Voicemails left after 4:30 p.m. will not be returned until the following business day.  For prescription refill requests, have your pharmacy contact our office.       Resources For Cancer Patients and their Caregivers ? American Cancer Society: Can assist with transportation, wigs, general needs, runs Look Good Feel Better.        234-783-6388 ? Cancer Care: Provides financial assistance, online support groups, medication/co-pay assistance.  1-800-813-HOPE  (825) 096-3294) ? [facility] Assists [place] cancer patients and their families through emotional , educational and financial support.  520-771-8357 ? [place] DSS Where to apply for food stamps, Medicaid and utility assistance. 9597469891 ? [facility]: Transportation to medical appointments. 808-847-4089 ? Social Security Administration: May apply for disability if have a Stage IV cancer. 209-230-8468 2767911857 ? [employer], Disability and Transit Services: Assists with nutrition, care and transit needs. [place] Support Programs: @10RELATIVEDAYS @ > Cancer Support Group  2nd Tuesday of the month 1pm-2pm, Journey Room  > Creative Journey  3rd Tuesday of the month 1130am-1pm, Journey Room  > Look Good Feel Better  1st Wednesday of the month 10am-12 noon, Journey Room (Call [facility] to register [phone])

## 2017-07-10 NOTE — Progress Notes (Signed)
Jeremy Parrish presents today for injection per MD orders. B12 1096mcg administered IM in left deltoid. Administration without incident. Patient tolerated well.

## 2017-07-10 NOTE — Progress Notes (Signed)
Patient is taking Revlimid and has not missed any doses and reports no side effects at this time.  

## 2017-07-10 NOTE — Progress Notes (Signed)
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 Colavito 76 y.o. male returns for follow-up for history of multiple myeloma and lymphoma, as well as hypogammaglobulinemia.   Overall, he tells me he has been feeling well.  His biggest complaint today is arthralgias, which have been a chronic issue for him. He is requesting a refill of Norco today.  He also reports struggling with numbness/shooting pains to his RLQ abdomen and down the side of his (R) thigh.   Appetite and energy levels have been good per his report; he has occasional fatigue. Denies any fever, chills, or recent infections.  States that his weight is stable "usually between 185-195 lbs."  Denies any night sweats, easy bruising, bleeding episodes, severe fatigue, or unintentional weight loss.  Denies blood in his stools, dark/tarry stools, hematuria, nosebleeds, or gingival bleeding.   Requests refill of his nitroglycerin; denies any recent episodes of chest pain and no current chest pain. Instructed him to call his cardiologist or PCP who prescribes his nitro; he agreed.   He has been working with outpatient PT/OT and feels like he is getting stronger.   He has a new great grandson named Media planner. He is excited to show me some photos of him today.   Continues on Revlimid with good tolerance.  He takes it as prescribed.    He is here today for his next dose of IVIG and  vitamin B12 injection.      REVIEW OF SYSTEMS:  Review of Systems  Constitutional: Positive for fatigue. Negative for chills, fever and unexpected weight change.  HENT:  Negative.  Negative for lump/mass and nosebleeds.   Eyes: Negative.   Respiratory: Negative.  Negative for cough and shortness of breath.   Cardiovascular: Negative.  Negative for chest pain.  Gastrointestinal: Negative.  Negative for blood in stool, constipation, diarrhea, nausea and vomiting.  Endocrine: Negative.   Genitourinary: Negative.  Negative for dysuria and hematuria.   Musculoskeletal: Positive for arthralgias. Negative for back pain.  Skin: Negative.  Negative for rash.  Neurological: Positive for numbness and speech difficulty (chronic expressive aphasia). Negative for dizziness and headaches.  Hematological: Negative.  Negative for adenopathy. Does not bruise/bleed easily.  Psychiatric/Behavioral: Negative.      PAST MEDICAL/SURGICAL HISTORY:  Past Medical History:  Diagnosis Date  . Anemia   . Aortic aneurysm of unspecified site without mention of rupture   . Arthritis   . Bladder neck contracture   . Cancer ([place])   . Cerebral atherosclerosis    Carotid Doppler, 02/16/2013 - Bilateral Proximal ICAs,demonstrate mild plaque w/o evidence of significant diameter reduction, dissection, or any other vascular abnormality  . CHF (congestive heart failure) ([place])   . Complication of anesthesia   . COPD (chronic obstructive pulmonary disease) ([place])   . Coronary artery disease   . Depression   . Esophageal reflux   . Heart disease   . Heart  murmur   . Hx of bladder cancer 10/07/2012  . Hyperlipidemia   . Hypertension   . Hypogammaglobulinemia ([place]) 09/28/2012   Secondary to Lymphoma and Multiple Myeloma and their treatments  . Intestinovesical fistula   . Kidney stones    history  . Lung mass   . Multiple myeloma   . Myocardial infarction [facility])    '96  . Non Hodgkin's lymphoma ([place])   .  Paroxysmal atrial fibrillation ([place]) 01/02/2016  . Peripheral arterial disease ([place])   . Personal history of other diseases of circulatory system   . PONV (postoperative nausea and vomiting)   . Prostate cancer ([place]) 2000  . Shingles   . Shortness of breath   . Sleep apnea    05-02-14 cpap , not yet used- suggested settings 5  . Stroke [facility]) 2013   Speech.   Past Surgical History:  Procedure Laterality Date  . BLADDER SURGERY    . BONE MARROW TRANSPLANT  2011  . COLON SURGERY     colon resection  . COLONOSCOPY N/A 01/01/2013   Procedure: COLONOSCOPY;  Surgeon: Rogene Houston, MD;  Location: [facility] ENDO SUITE;  Service: Endoscopy;  Laterality: N/A;  825-moved to [place] notified pt  . CORONARY ANGIOPLASTY  06/24/2000   PCI and stenting in mid & proximal RCA  . heart stents x 5  1999  . INGUINAL HERNIA REPAIR Right 05/04/2014   Procedure: OPEN RIGHT INGUINAL HERNIA REPAIR with mesh;  Surgeon: Edward Jolly, MD;  Location: [facility] ORS;  Service: General;  Laterality: Right;  . INSERT / REPLACE / REMOVE PACEMAKER    . left ear skin cancer removed    . NM MYOCAR PERF WALL MOTION  11/27/2007   inferior scar  . PACEMAKER INSERTION  07/22/2011   [employer]  . PORTACATH PLACEMENT  07/26/2009   right chest  . PROSTATE SURGERY    . Rotator    . ROTATOR CUFF REPAIR Right   . SHOULDER ARTHROSCOPY WITH SUBACROMIAL DECOMPRESSION Right 07/21/2013   Procedure: RIGHT SHOULDER ARTHROSCOPY WITH SUBACROMIAL DECOMPRESSION AND DEBRIDEMENT & Injection of Left Shoulder;  Surgeon: Alta Corning, MD;  Location: [facility];  Service: Orthopedics;  Laterality: Right;  . TEE WITHOUT CARDIOVERSION  10/13/2012   Procedure: TRANSESOPHAGEAL ECHOCARDIOGRAM (TEE);  Surgeon: Sanda Klein, MD;  Location: [facility] ENDOSCOPY;  Service: Cardiovascular;  Laterality: N/A;  pat/kay/echo notified  . US ECHOCARDIOGRAPHY  06/19/2011   RV mildly dilated,mild to mod. MR,mild AI,mild PI  . WRIST SURGERY     right     SOCIAL HISTORY:  Social  History   Social History  . Marital status: Married    Spouse name: Jeremy Parrish  . Number of children: 3  . Years of education: 9th   Occupational History  . retired     Social History Main Topics  . Smoking status: Former Smoker    Packs/day: 1.00    Years: 20.00    Types: Cigarettes    Quit date: 11/14/1994  . Smokeless tobacco: Never Used  . Alcohol use No     Comment: previously drank but none for at least 15 years.  . Drug use: No  . Sexual activity: Not on file   Other Topics Concern  . Not on file   Social History Narrative   Patient lives at home spouse.   Caffeine Use: Occasionally    FAMILY HISTORY:  Family History  Problem Relation Age of Onset  . Cancer Father  bladder  . Heart disease Father        before age 61  . Hypertension Mother   . Cancer Brother   . Heart disease Brother        before age 15  . Heart disease Sister        before age 32  . Hyperlipidemia Sister   . Hypertension Sister   . Heart attack Sister   . Colon cancer Neg Hx   . Colon polyps Neg Hx     CURRENT MEDICATIONS:  Outpatient Encounter Prescriptions as of 07/10/2017  Medication Sig Note  . albuterol (PROVENTIL HFA;VENTOLIN HFA) 108 (90 Base) MCG/ACT inhaler Inhale 2 puffs into the lungs every 6 (six) hours as needed for wheezing or shortness of breath.   Marland Kitchen albuterol (PROVENTIL) (2.5 MG/3ML) 0.083% nebulizer solution Use via neb q 4 hours prn wheezing (Patient taking differently: Take 2.5 mg by nebulization every 4 (four) hours as needed for wheezing or shortness of breath. )   . ALPRAZolam (XANAX) 0.5 MG tablet Take 1-2 tablets (0.5-1 mg total) by mouth at bedtime as needed for anxiety.   Marland Kitchen amLODipine (NORVASC) 10 MG tablet TAKE 1 TABLET (10 MG TOTAL) BY MOUTH EVERY MORNING.   . budesonide (PULMICORT) 0.5 MG/2ML nebulizer solution Take 2 mLs (0.5 mg total) by nebulization 2 (two) times daily. (Patient taking differently: Take 0.5 mg by nebulization daily as needed (for  shortness of breath). )   . chlorzoxazone (PARAFON) 500 MG tablet Take 1 tablet (500 mg total) by mouth 3 (three) times daily as needed for muscle spasms.   . citalopram (CELEXA) 40 MG tablet TAKE 1 TABLET (40 MG TOTAL) BY MOUTH DAILY.   Marland Kitchen clopidogrel (PLAVIX) 75 MG tablet TAKE 1 TABLET BY MOUTH EVERY DAY   . diclofenac sodium (VOLTAREN) 1 % GEL Apply 2 g topically 4 (four) times daily as needed.   Marland Kitchen ELIQUIS 5 MG TABS tablet TAKE 1 TABLET BY MOUTH TWICE A DAY   . furosemide (LASIX) 20 MG tablet USE SPARINGLY AS NEEDED FOR EDEMA NO MORE THAN ONE PER WEEK   . HYDROcodone-acetaminophen (NORCO/VICODIN) 5-325 MG tablet Take 1 tablet by mouth every 6 (six) hours as needed.   Marland Kitchen ketoconazole (NIZORAL) 2 % cream APPLY 1 APPLICATION TOPICALLY 2 (TWO) TIMES DAILY.   Marland Kitchen KLOR-CON M20 20 MEQ tablet Take 1 tablet (20 mEq total) by mouth 3 (three) times daily. Reported on 11/15/2015   . lenalidomide (REVLIMID) 10 MG capsule TAKE 1 CAPSULE BY MOUTH DAILY FOR 7 DAYS ON FOLLOWED BY 7 DAYS OFF. THEN REPEAT CYCLE.   Marland Kitchen levocetirizine (XYZAL) 5 MG tablet Take 5 mg by mouth daily as needed for allergies.    Marland Kitchen LYRICA 75 MG capsule  07/04/2016: Received from: External Pharmacy  . nitroGLYCERIN (NITROSTAT) 0.4 MG SL tablet Place 1 tablet (0.4 mg total) under the tongue every 5 (five) minutes as needed for chest pain.   Marland Kitchen omeprazole (PRILOSEC) 20 MG capsule TAKE 1 CAPSULE (20 MG TOTAL) BY MOUTH DAILY.   Marland Kitchen potassium chloride SA (K-DUR,KLOR-CON) 20 MEQ tablet Take 1 tablet (20 mEq total) by mouth 3 (three) times daily.   . simvastatin (ZOCOR) 20 MG tablet TAKE 1 TABLET (20 MG TOTAL) BY MOUTH AT BEDTIME.    Facility-Administered Encounter Medications as of 07/10/2017  Medication  . acetaminophen (TYLENOL) tablet 650 mg  . sodium chloride 0.9 % injection 10 mL    ALLERGIES:  Allergies  Allergen Reactions  . Morphine And Related  Other (See Comments)    hallucinations  . Tape Rash    Paper tape is ok     PHYSICAL EXAM:    ECOG Performance status: 1-2 - Symptomatic; requires occasional assistance, but is largely independent.      Physical Exam  Constitutional: He is oriented to person, place, and time and well-developed, well-nourished, and in no distress.  Seen in chemo bed in infusion area   HENT:  Head: Normocephalic.  Mouth/Throat: Oropharynx is clear and moist.  Eyes: Conjunctivae are normal. No scleral icterus.  Neck: Normal range of motion. Neck supple.  Cardiovascular: Normal rate and regular rhythm.   Pulmonary/Chest: He has wheezes (expiratory wheezes).  Abdominal: Soft. Bowel sounds are normal. There is no tenderness. There is no rebound.  Musculoskeletal: He exhibits no edema.  No focal low back pain on palpation  Lymphadenopathy:    He has no cervical adenopathy.  Neurological: He is alert and oriented to person, place, and time. Gait normal.  Stuttered/garbled speech (chronic history of expressive aphasia)  Skin: Skin is warm and dry. No rash noted.  Psychiatric: Mood, memory, affect and judgment normal.  Nursing note and vitals reviewed.    LABORATORY DATA:  I have reviewed the labs as listed.  CBC    Component Value Date/Time   WBC 3.2 (L) 06/12/2017 0902   RBC 3.88 (L) 06/12/2017 0902   HGB 11.3 (L) 06/12/2017 0902   HCT 35.3 (L) 06/12/2017 0902   PLT 118 (L) 06/12/2017 0902   MCV 91.0 06/12/2017 0902   MCH 29.1 06/12/2017 0902   MCHC 32.0 06/12/2017 0902   RDW 15.3 06/12/2017 0902   LYMPHSABS 1.4 06/12/2017 0902   MONOABS 0.3 06/12/2017 0902   EOSABS 0.2 06/12/2017 0902   BASOSABS 0.0 06/12/2017 0902   CMP Latest Ref Rng & Units 06/12/2017 05/15/2017 05/15/2017  Glucose 65 - 99 mg/dL 81 - 115(H)  BUN 6 - 20 mg/dL 10 - 13  Creatinine 0.61 - 1.24 mg/dL 1.03 - 0.90  Sodium 135 - 145 mmol/L 138 - 136  Potassium 3.5 - 5.1 mmol/L 3.9 - 4.3  Chloride 101 - 111 mmol/L 105 - 103  CO2 22 - 32 mmol/L 25 - 26  Calcium 8.9 - 10.3 mg/dL 8.9 - 9.6  Total Protein 6.5 - 8.1 g/dL 6.6  7.2 7.1  Total Bilirubin 0.3 - 1.2 mg/dL 0.7 0.8 0.7  Alkaline Phos 38 - 126 U/L 70 84 85  AST 15 - 41 U/L _0 ALT 17 - 63 U/L 14(L) 13(L) 14(L)   Results for JEAN, SKOW (MRN [id number])   Ref. Range 05/15/2017 08:46  Total Protein ELP Latest Ref Range: 6.0 - 8.5 g/dL 6.8  Albumin ELP Latest Ref Range: 2.9 - 4.4 g/dL 3.6  Globulin, Total Latest Ref Range: 2.2 - 3.9 g/dL 3.2  A/G Ratio Latest Ref Range: 0.7 - 1.7  1.1  Alpha-1-Globulin Latest Ref Range: 0.0 - 0.4 g/dL 0.3  Alpha-2-Globulin Latest Ref Range: 0.4 - 1.0 g/dL 0.7  Beta Globulin Latest Ref Range: 0.7 - 1.3 g/dL 1.1  Gamma Globulin Latest Ref Range: 0.4 - 1.8 g/dL 1.1  M-SPIKE, % Latest Ref Range: Not Observed g/dL Not Observed     Results for OBALOLUWA, DELATTE (MRN [id number])   Ref. 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 Recommendations from [facility] to continue maintenance Revlimid beyond the typical 2 years of maintenance therapy.  Continues to see transplant team annually.  -Tolerating Revlimid well and reports compliance; continue as directed.   -Myeloma labs are pending for today; M-spike has been undetectable as expected.  -Return to cancer center for follow-up in 3 months with repeat myeloma labs.   Marginal zone lymphoma: -Diagnosed in 05/2006 after lung biopsy; s/p R-CHOP chemotherapy x 6 cycles. Has been without evidence of  disease since that time.  -No lymphadenopathy on exam today; no reported B symptoms.  Will continue monitoring with physical exam at regular intervals. No role for imaging unless clinically indicated.   Hypogammaglobulinemia: -IVIG every 4 weeks for history of recurrent infections.  -Due for infusion today. Will proceed as scheduled.  -Return monthly for IVIG.   Vitamin B12 deficiency:  -Continue monthly B12 injections.   Arthralgias/Myalgias:  -Taking Chlorzoxazone PRN.  -[place] Controlled Substance Reporting System reviewed. Received small quantity Norco 5/325 from his PCP on 05/06/17 for rib pain after a fall.  He is requesting a refill of this medication today.  It looks like our office has prescribed Norco for him in the past (in 01/2017).  Refill is appropriate and he was given a paper prescription for Norco today.   -Continue Voltaren gel PRN as well for pain.  -Continue PT/OT as directed.   Neuropathy:  -Reports chronic concerns with neuropathy to his right lower abd and thigh area. No inguinal lymphadenopathy on exam. No pain on palpation.  He has been taking Lyrica 75 mg "for a long time, but I don't think it's doing any good."  States that he has taken gabapentin in the past, but it didn't help either.  He is not sure who has been giving him the prescription for the Lyrica (EPIC review only says "historical provider").   -Discussed referral to neurology for additional evaluation, as I do not think his hypogammaglobulinemia or h/o lymphoma are related to his current complaints.  He declined referral today. We could consider increasing his Lyrica dose, if the original prescription came from the cancer center; encouraged patient to call us after he reviews Rx bottle to let us know who the prescriber has been for the Lyrica.             Dispo:  -Continue monthly IVIG and vitamin B12 injections.  -Return to cancer center in 3 months for follow-up visit with labs, infusion, and  injection.     All questions were answered to patient's stated satisfaction. Encouraged patient to call with any new concerns or questions before his next visit to the cancer center and we can certain see him sooner, if needed.    Plan of care discussed with Dr. Talbert Cage, who agrees with the above aforementioned.    Orders placed this encounter:  No orders of the defined types were placed in this encounter.     Mike Craze, NP [place] [phone]

## 2017-07-10 NOTE — Patient Instructions (Signed)
[facility] at [facility] Discharge Instructions  RECOMMENDATIONS MADE BY THE CONSULTANT AND ANY TEST RESULTS WILL BE SENT TO YOUR REFERRING PHYSICIAN.  You were seen today by Gretchen Dawson, NP  See schedulers up front for appointments   Thank you for choosing [facility] at [facility] to provide your oncology and hematology care.  To afford each patient quality time with our provider, please arrive at least 15 minutes before your scheduled appointment time.    If you have a lab appointment with the Cancer Center please come in thru the  Main Entrance and check in at the main information desk  You need to re-schedule your appointment should you arrive 10 or more minutes late.  We strive to give you quality time with our providers, and arriving late affects you and other patients whose appointments are after yours.  Also, if you no show three or more times for appointments you may be dismissed from the clinic at the providers discretion.     Again, thank you for choosing [facility].  Our hope is that these requests will decrease the amount of time that you wait before being seen by our physicians.       _____________________________________________________________  Should you have questions after your visit to  Cancer Center, please contact our office at [phone] between the hours of 8:30 a.m. and 4:30 p.m.  Voicemails left after 4:30 p.m. will not be returned until the following business day.  For prescription refill requests, have your pharmacy contact our office.       Resources For Cancer Patients and their Caregivers ? American Cancer Society: Can assist with transportation, wigs, general needs, runs Look Good Feel Better.        1-888-227-6333 ? Cancer Care: Provides financial assistance, online support groups, medication/co-pay assistance.  1-800-813-HOPE (4673) ? [facility] Assists [place] cancer patients and their families through emotional , educational and financial support.  336-427-4357 ? [place] DSS Where to apply for food stamps, Medicaid and utility assistance. 336-342-1394 ? [facility]: Transportation to medical appointments. 336-347-2287 ? Social Security Administration: May apply for disability if have a Stage IV cancer. 336-342-7796 1-800-772-1213 ? [place] Aging, Disability and Transit Services: Assists with nutrition, care and transit needs. 336-349-2343  Cancer Center Support Programs: @10RELATIVEDAYS@ > Cancer Support Group  2nd Tuesday of the month 1pm-2pm, Journey Room  > Creative Journey  3rd Tuesday of the month 1130am-1pm, Journey Room  > Look Good Feel Better  1st Wednesday of the month 10am-12 noon, Journey Room (Call American Cancer Society to register 1-800-395-5775)    

## 2017-07-11 LAB — IGG, IGA, IGM
IGA: 377 mg/dL (ref 61–437)
IGG (IMMUNOGLOBIN G), SERUM: 931 mg/dL (ref 700–1600)
IgM (Immunoglobulin M), Srm: 25 mg/dL (ref 15–143)

## 2017-07-11 LAB — PROTEIN ELECTROPHORESIS, SERUM
A/G Ratio: 1.2 (ref 0.7–1.7)
ALBUMIN ELP: 3.3 g/dL (ref 2.9–4.4)
ALPHA-1-GLOBULIN: 0.2 g/dL (ref 0.0–0.4)
ALPHA-2-GLOBULIN: 0.6 g/dL (ref 0.4–1.0)
Beta Globulin: 0.8 g/dL (ref 0.7–1.3)
Gamma Globulin: 1 g/dL (ref 0.4–1.8)
Globulin, Total: 2.7 g/dL (ref 2.2–3.9)
Total Protein ELP: 6 g/dL (ref 6.0–8.5)

## 2017-07-11 LAB — KAPPA/LAMBDA LIGHT CHAINS
KAPPA FREE LGHT CHN: 45.3 mg/L — AB (ref 3.3–19.4)
Kappa, lambda light chain ratio: 1.37 (ref 0.26–1.65)
Lambda free light chains: 33.1 mg/L — ABNORMAL HIGH (ref 5.7–26.3)

## 2017-07-17 LAB — IMMUNOFIXATION ELECTROPHORESIS
IGA: 380 mg/dL (ref 61–437)
IGG (IMMUNOGLOBIN G), SERUM: 1160 mg/dL (ref 700–1600)
IgM (Immunoglobulin M), Srm: 39 mg/dL (ref 15–143)
TOTAL PROTEIN ELP: 6.3 g/dL (ref 6.0–8.5)

## 2017-07-18 ENCOUNTER — Encounter: Payer: Self-pay | Admitting: Cardiology

## 2017-07-24 ENCOUNTER — Other Ambulatory Visit (HOSPITAL_COMMUNITY): Payer: Self-pay | Admitting: Emergency Medicine

## 2017-07-24 DIAGNOSIS — C9001 Multiple myeloma in remission: Secondary | ICD-10-CM

## 2017-07-24 MED ORDER — LENALIDOMIDE 10 MG PO CAPS: ORAL_CAPSULE | ORAL | 0 refills | Status: DC

## 2017-07-24 NOTE — Progress Notes (Signed)
revlimid refilled

## 2017-07-29 ENCOUNTER — Ambulatory Visit (INDEPENDENT_AMBULATORY_CARE_PROVIDER_SITE_OTHER): Payer: Medicare Other | Admitting: Cardiovascular Disease

## 2017-07-29 ENCOUNTER — Encounter: Payer: Self-pay | Admitting: Cardiovascular Disease

## 2017-07-29 VITALS — BP 142/83 | HR 68 | Ht 67.0 in | Wt 197.2 lb

## 2017-07-29 DIAGNOSIS — I480 Paroxysmal atrial fibrillation: Secondary | ICD-10-CM | POA: Diagnosis not present

## 2017-07-29 DIAGNOSIS — E785 Hyperlipidemia, unspecified: Secondary | ICD-10-CM

## 2017-07-29 DIAGNOSIS — I10 Essential (primary) hypertension: Secondary | ICD-10-CM | POA: Diagnosis not present

## 2017-07-29 DIAGNOSIS — I2510 Atherosclerotic heart disease of native coronary artery without angina pectoris: Secondary | ICD-10-CM

## 2017-07-29 MED ORDER — NITROGLYCERIN 0.4 MG SL SUBL
0.4000 mg | SUBLINGUAL_TABLET | SUBLINGUAL | 4 refills | Status: DC | PRN
Start: 2017-07-29 — End: 2019-05-25

## 2017-07-29 MED ORDER — FUROSEMIDE 20 MG PO TABS: ORAL_TABLET | ORAL | 0 refills | Status: AC

## 2017-07-29 NOTE — Assessment & Plan Note (Signed)
History of permanent transvenous pacemaker placed by Dr. Sallyanne Kuster for chronic symptomatically bradycardia and fatigue. He follows remotely orally and with Dr. Sallyanne Kuster on annual basis.

## 2017-07-29 NOTE — Assessment & Plan Note (Signed)
History of paroxysmal atrial fibrillation on oral anticoagulation status post stroke in the past.

## 2017-07-29 NOTE — Assessment & Plan Note (Signed)
History of essential hypertension blood pressure measured at 142/83. He is on amlodipine. Continue current meds at current dosing

## 2017-07-29 NOTE — Progress Notes (Signed)
07/29/2017 Jeremy Parrish   July 22, 1941  [id number]  Primary Physician Mikey Kirschner, MD Primary Cardiologist: Lorretta Harp MD Garret Reddish, [place], [place]  HPI:  Jeremy Parrish is a 76 y.o. male mildly overweight, married Caucasian male, father of 39, grandfather to 4 grandchildren who I saw 06/26/16.Marland Kitchen He is accompanied by his wife today. He has a history of CAD dating back to 21. I intervened on him multiple times and have put stents in his RCA and circumflex coronary arteries. His last cath performed Apr 10, 2006, revealed patent stents with a 30% proximal LAD lesion and an EF of 45%. He does have chronic dyspnea but denies chest pain. His other problems include hypertension and hyperlipidemia. In addition, he does have bladder cancer as well as lymphoma, myeloma. He has had a partial colectomy. 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 Sallyanne Parrish with improvement in his symptoms. He was last seen in the office for interrogation January 02, 2012  I saw him one year ago. He complains of increasing dyspnea and occasional chest pain. His last 2-D echo was performed 2 years ago and he had a hypokinetic lateral wall of his left Myoview stress test was performed 11/27/07. He is scheduled for right rotator cuff surgery in the near future. I obtained a Myoview stress test a 2-D echo revealing inferolateral scar unchanged from prior functional studies and moderately reduced LVEF of 35-40%.based on the results of his Myoview I cleared him for his rotator cuff surgeryAt moderately increased risk. He saw Jeremy Parrish back in the office in January 2015 complaining of chest pain. Again the Myoview was intermediate risk because of scar with moderate LV dysfunction. He was placed on by mouth Imdur resulting in improvement in his chest  pain. Because of symptoms of obstructive sleep apnea I obtain a sleep study which was abnormal. He was wearing C Pap however currently he has not. His major complaints are of chronic dyspnea which does not seem to have changed in severity and some orthostatic dizziness. Since I saw me or go to drink clear liquids stable. He's had one episode of nitroglycerin responsive chest pain but otherwise no chest pain or shortness of breath. He does have mild lower extremity edema which were which he uses when necessary Lasix.  Current Meds  Medication Sig  . albuterol (PROVENTIL HFA;VENTOLIN HFA) 108 (90 Base) MCG/ACT inhaler Inhale 2 puffs into the lungs every 6 (six) hours as needed for wheezing or shortness of breath.  Marland Kitchen albuterol (PROVENTIL) (2.5 MG/3ML) 0.083% nebulizer solution Use via neb q 4 hours prn wheezing (Patient taking differently: Take 2.5 mg by nebulization every 4 (four) hours as needed for wheezing or shortness of breath. )  . ALPRAZolam (XANAX) 0.5 MG tablet Take 1-2 tablets (0.5-1 mg total) by mouth at bedtime as needed for anxiety.  Marland Kitchen amLODipine (NORVASC) 10 MG tablet TAKE 1 TABLET (10 MG TOTAL) BY MOUTH EVERY MORNING.  . budesonide (PULMICORT) 0.5 MG/2ML nebulizer solution Take 2 mLs (0.5 mg total) by nebulization 2 (two) times daily. (Patient taking differently: Take 0.5 mg by nebulization daily as needed (for shortness of breath). )  . chlorzoxazone (PARAFON) 500 MG tablet Take 1 tablet (500 mg total) by mouth 3 (three) times daily as needed for muscle spasms.  . citalopram (CELEXA) 40  MG tablet TAKE 1 TABLET (40 MG TOTAL) BY MOUTH DAILY.  Marland Kitchen clopidogrel (PLAVIX) 75 MG tablet TAKE 1 TABLET BY MOUTH EVERY DAY  . diclofenac sodium (VOLTAREN) 1 % GEL Apply 2 g topically 4 (four) times daily as needed.  Marland Kitchen ELIQUIS 5 MG TABS tablet TAKE 1 TABLET BY MOUTH TWICE A DAY  . furosemide (LASIX) 20 MG tablet Use sparingly as needed for edema. No more than one tablet by mouth per week.  Marland Kitchen  HYDROcodone-acetaminophen (NORCO/VICODIN) 5-325 MG tablet Take 1 tablet by mouth every 6 (six) hours as needed.  Marland Kitchen ketoconazole (NIZORAL) 2 % cream APPLY 1 APPLICATION TOPICALLY 2 (TWO) TIMES DAILY.  Marland Kitchen KLOR-CON M20 20 MEQ tablet Take 1 tablet (20 mEq total) by mouth 3 (three) times daily. Reported on 11/15/2015  . lenalidomide (REVLIMID) 10 MG capsule TAKE 1 CAPSULE BY MOUTH DAILY FOR 7 DAYS ON FOLLOWED BY 7 DAYS OFF. THEN REPEAT CYCLE.  Marland Kitchen levocetirizine (XYZAL) 5 MG tablet Take 5 mg by mouth daily as needed for allergies.   Marland Kitchen LYRICA 75 MG capsule   . nitroGLYCERIN (NITROSTAT) 0.4 MG SL tablet Place 1 tablet (0.4 mg total) under the tongue every 5 (five) minutes as needed for chest pain.  Marland Kitchen omeprazole (PRILOSEC) 20 MG capsule TAKE 1 CAPSULE (20 MG TOTAL) BY MOUTH DAILY.  Marland Kitchen potassium chloride SA (K-DUR,KLOR-CON) 20 MEQ tablet Take 1 tablet (20 mEq total) by mouth 3 (three) times daily.  . simvastatin (ZOCOR) 20 MG tablet TAKE 1 TABLET (20 MG TOTAL) BY MOUTH AT BEDTIME.  . [DISCONTINUED] furosemide (LASIX) 20 MG tablet USE SPARINGLY AS NEEDED FOR EDEMA NO MORE THAN ONE PER WEEK  . [DISCONTINUED] nitroGLYCERIN (NITROSTAT) 0.4 MG SL tablet Place 1 tablet (0.4 mg total) under the tongue every 5 (five) minutes as needed for chest pain.     Allergies  Allergen Reactions  . Morphine And Related Other (See Comments)    hallucinations  . Tape Rash    Paper tape is ok    Social History   Social History  . Marital status: Married    Spouse name: Jeremy Parrish  . Number of children: 3  . Years of education: 9th   Occupational History  . retired     Social History Main Topics  . Smoking status: Former Smoker    Packs/day: 1.00    Years: 20.00    Types: Cigarettes    Quit date: 11/14/1994  . Smokeless tobacco: Never Used  . Alcohol use No     Comment: previously drank but none for at least 15 years.  . Drug use: No  . Sexual activity: Not on file   Other Topics Concern  . Not on file   Social  History Narrative   Patient lives at home spouse.   Caffeine Use: Occasionally     Review of Systems: General: negative for chills, fever, night sweats or weight changes.  Cardiovascular: negative for chest pain, dyspnea on exertion, edema, orthopnea, palpitations, paroxysmal nocturnal dyspnea or shortness of breath Dermatological: negative for rash Respiratory: negative for cough or wheezing Urologic: negative for hematuria Abdominal: negative for nausea, vomiting, diarrhea, bright red blood per rectum, melena, or hematemesis Neurologic: negative for visual changes, syncope, or dizziness All other systems reviewed and are otherwise negative except as noted above.    Blood pressure (!) 142/83, pulse 68, height 5\' 7"  (1.702 m), weight 197 lb 3.2 oz (89.4 kg).  General appearance: alert and no distress Neck: no adenopathy, no carotid  bruit, no JVD, supple, symmetrical, trachea midline and thyroid not enlarged, symmetric, no tenderness/mass/nodules Lungs: clear to auscultation bilaterally Heart: regular rate and rhythm, S1, S2 normal, no murmur, click, rub or gallop Extremities: 1+ lower extremity edema Pulses: 2+ and symmetric Skin: Skin color, texture, turgor normal. No rashes or lesions Neurologic: Alert and oriented X 3, normal strength and tone. Normal symmetric reflexes. Normal coordination and gait  EKG initially paced rhythm at 68 with left anterior fascicular block and early R-wave transition. I personally reviewed this EKG.  ASSESSMENT AND PLAN:   H/O cardiac pacemaker, Medtronic REVO, MRI conditional device, placed 07/2011 for sympyomatic bradycardia History of permanent transvenous pacemaker placed by Dr. Sallyanne Parrish for chronic symptomatically bradycardia and fatigue. He follows remotely orally and with Dr. Sallyanne Parrish on annual basis.  Essential hypertension History of essential hypertension blood pressure measured at 142/83. He is on amlodipine. Continue current meds at  current dosing  Coronary atherosclerosis History of CAD dating back to 1996. I intervened on him multiple times with stents in his RCA and circumflex coronary arteries. His last cath 04/10/06 revealed patent stents with 30% proximal LAD and EF of 45%. His most recent Myoview performed 11/27/07 was low risk.  Hyperlipidemia with target LDL less than 100 History of hyperlipidemia on statin therapy with recent lipid profile performed 05/15/17 revealing a total cholesterol 102, LDL 47 and HDL of 33.  Paroxysmal atrial fibrillation (HCC) History of paroxysmal atrial fibrillation on oral anticoagulation status post stroke in the past.      Lorretta Harp MD [facility], [facility] 07/29/2017 10:44 AM

## 2017-07-29 NOTE — Assessment & Plan Note (Signed)
History of CAD dating back to 36. I intervened on him multiple times with stents in his RCA and circumflex coronary arteries. His last cath 04/10/06 revealed patent stents with 30% proximal LAD and EF of 45%. His most recent Myoview performed 11/27/07 was low risk.

## 2017-07-29 NOTE — Assessment & Plan Note (Signed)
History of hyperlipidemia on statin therapy with recent lipid profile performed 05/15/17 revealing a total cholesterol 102, LDL 47 and HDL of 33.

## 2017-07-29 NOTE — Patient Instructions (Signed)

## 2017-08-08 ENCOUNTER — Other Ambulatory Visit (HOSPITAL_COMMUNITY): Payer: Self-pay | Admitting: *Deleted

## 2017-08-08 DIAGNOSIS — C9001 Multiple myeloma in remission: Secondary | ICD-10-CM

## 2017-08-11 ENCOUNTER — Encounter (HOSPITAL_COMMUNITY): Payer: Self-pay

## 2017-08-11 ENCOUNTER — Encounter (HOSPITAL_COMMUNITY): Payer: Medicare Other | Attending: Oncology

## 2017-08-11 ENCOUNTER — Encounter (HOSPITAL_BASED_OUTPATIENT_CLINIC_OR_DEPARTMENT_OTHER): Payer: Medicare Other

## 2017-08-11 VITALS — BP 135/75 | HR 68 | Temp 98.0°F | Resp 18 | Wt 192.8 lb

## 2017-08-11 DIAGNOSIS — E538 Deficiency of other specified B group vitamins: Secondary | ICD-10-CM

## 2017-08-11 DIAGNOSIS — C9000 Multiple myeloma not having achieved remission: Secondary | ICD-10-CM | POA: Insufficient documentation

## 2017-08-11 DIAGNOSIS — D801 Nonfamilial hypogammaglobulinemia: Secondary | ICD-10-CM | POA: Diagnosis present

## 2017-08-11 DIAGNOSIS — C9001 Multiple myeloma in remission: Secondary | ICD-10-CM

## 2017-08-11 LAB — COMPREHENSIVE METABOLIC PANEL
ALK PHOS: 76 U/L (ref 38–126)
ALT: 15 U/L — AB (ref 17–63)
AST: 26 U/L (ref 15–41)
Albumin: 3.5 g/dL (ref 3.5–5.0)
Anion gap: 9 (ref 5–15)
BUN: 11 mg/dL (ref 6–20)
CALCIUM: 8.7 mg/dL — AB (ref 8.9–10.3)
CHLORIDE: 106 mmol/L (ref 101–111)
CO2: 22 mmol/L (ref 22–32)
CREATININE: 1.04 mg/dL (ref 0.61–1.24)
GFR calc non Af Amer: >60 mL/min (ref >60)
GLUCOSE: 173 mg/dL — AB (ref 65–99)
Potassium: 3.6 mmol/L (ref 3.5–5.1)
SODIUM: 137 mmol/L (ref 135–145)
Total Bilirubin: 0.6 mg/dL (ref 0.3–1.2)
Total Protein: 6.6 g/dL (ref 6.5–8.1)

## 2017-08-11 LAB — CBC WITH DIFFERENTIAL/PLATELET
Basophils Absolute: 0.1 10*3/uL (ref 0.0–0.1)
Basophils Relative: 1 %
Eosinophils Absolute: 0.1 10*3/uL (ref 0.0–0.7)
Eosinophils Relative: 3 %
HEMATOCRIT: 36.9 % — AB (ref 39.0–52.0)
HEMOGLOBIN: 12 g/dL — AB (ref 13.0–17.0)
LYMPHS ABS: 1.7 10*3/uL (ref 0.7–4.0)
LYMPHS PCT: 42 %
MCH: 28.4 pg (ref 26.0–34.0)
MCHC: 32.5 g/dL (ref 30.0–36.0)
MCV: 87.2 fL (ref 78.0–100.0)
MONO ABS: 0.3 10*3/uL (ref 0.1–1.0)
MONOS PCT: 7 %
NEUTROS ABS: 1.8 10*3/uL (ref 1.7–7.7)
NEUTROS PCT: 47 %
PLATELETS: 110 10*3/uL — AB (ref 150–400)
RBC: 4.23 MIL/uL (ref 4.22–5.81)
RDW: 15.1 % (ref 11.5–15.5)
WBC: 3.9 10*3/uL — ABNORMAL LOW (ref 4.0–10.5)

## 2017-08-11 LAB — LACTATE DEHYDROGENASE: LDH: 134 U/L (ref 98–192)

## 2017-08-11 MED ORDER — DEXTROSE 5 % IV SOLN
INTRAVENOUS | Status: DC
  Administered 2017-08-11: 10:00:00 via INTRAVENOUS

## 2017-08-11 MED ORDER — SODIUM CHLORIDE 0.9 % IV SOLN
Freq: Once | INTRAVENOUS | Status: DC
Start: 2017-08-11 — End: 2017-08-11

## 2017-08-11 MED ORDER — IMMUNE GLOBULIN (HUMAN) 20 GM/200ML IV SOLN
400.0000 mg/kg | Freq: Once | INTRAVENOUS | Status: AC
  Administered 2017-08-11: 35 g via INTRAVENOUS
  Filled 2017-08-11: qty 200

## 2017-08-11 MED ORDER — CYANOCOBALAMIN 1000 MCG/ML IJ SOLN
1000.0000 ug | Freq: Once | INTRAMUSCULAR | Status: AC
  Administered 2017-08-11: 1000 ug via INTRAMUSCULAR
  Filled 2017-08-11: qty 1

## 2017-08-11 MED ORDER — HEPARIN SOD (PORK) LOCK FLUSH 100 UNIT/ML IV SOLN
500.0000 [IU] | Freq: Once | INTRAVENOUS | Status: AC | PRN
  Administered 2017-08-11: 500 [IU]
  Filled 2017-08-11 (×2): qty 5

## 2017-08-11 MED ORDER — ACETAMINOPHEN 325 MG PO TABS
ORAL_TABLET | ORAL | Status: AC
  Filled 2017-08-11: qty 2

## 2017-08-11 MED ORDER — ACETAMINOPHEN 325 MG PO TABS
650.0000 mg | ORAL_TABLET | Freq: Once | ORAL | Status: AC
  Administered 2017-08-11: 650 mg via ORAL

## 2017-08-11 NOTE — Progress Notes (Signed)
Tolerated infusion w/o adverse reaction.  Alert, in no distress.  VSS.  Discharged ambulatory.  

## 2017-08-12 LAB — IGG, IGA, IGM
IGG (IMMUNOGLOBIN G), SERUM: 944 mg/dL (ref 700–1600)
IgA: 410 mg/dL (ref 61–437)
IgM (Immunoglobulin M), Srm: 29 mg/dL (ref 15–143)

## 2017-08-12 LAB — PROTEIN ELECTROPHORESIS, SERUM
A/G RATIO SPE: 1 (ref 0.7–1.7)
ALBUMIN ELP: 3.1 g/dL (ref 2.9–4.4)
ALPHA-1-GLOBULIN: 0.2 g/dL (ref 0.0–0.4)
Alpha-2-Globulin: 0.7 g/dL (ref 0.4–1.0)
Beta Globulin: 1 g/dL (ref 0.7–1.3)
GLOBULIN, TOTAL: 3.1 g/dL (ref 2.2–3.9)
Gamma Globulin: 1.1 g/dL (ref 0.4–1.8)
TOTAL PROTEIN ELP: 6.2 g/dL (ref 6.0–8.5)

## 2017-08-12 LAB — KAPPA/LAMBDA LIGHT CHAINS
Kappa free light chain: 40.1 mg/L — ABNORMAL HIGH (ref 3.3–19.4)
Kappa, lambda light chain ratio: 1.39 (ref 0.26–1.65)
Lambda free light chains: 28.8 mg/L — ABNORMAL HIGH (ref 5.7–26.3)

## 2017-08-13 ENCOUNTER — Other Ambulatory Visit (HOSPITAL_COMMUNITY): Payer: Self-pay | Admitting: Adult Health

## 2017-08-13 DIAGNOSIS — E538 Deficiency of other specified B group vitamins: Secondary | ICD-10-CM

## 2017-08-22 ENCOUNTER — Other Ambulatory Visit (HOSPITAL_COMMUNITY): Payer: Self-pay | Admitting: *Deleted

## 2017-08-22 DIAGNOSIS — C9001 Multiple myeloma in remission: Secondary | ICD-10-CM

## 2017-08-22 LAB — IMMUNOFIXATION ELECTROPHORESIS
IGA: 414 mg/dL (ref 61–437)
IGG (IMMUNOGLOBIN G), SERUM: 900 mg/dL (ref 700–1600)
IgM (Immunoglobulin M), Srm: 25 mg/dL (ref 15–143)
Total Protein ELP: 7.4 g/dL (ref 6.0–8.5)

## 2017-08-22 MED ORDER — LENALIDOMIDE 10 MG PO CAPS: ORAL_CAPSULE | ORAL | 0 refills | Status: DC

## 2017-08-27 ENCOUNTER — Encounter (HOSPITAL_COMMUNITY): Payer: Self-pay

## 2017-08-27 ENCOUNTER — Encounter (HOSPITAL_BASED_OUTPATIENT_CLINIC_OR_DEPARTMENT_OTHER): Payer: Medicare Other

## 2017-08-27 DIAGNOSIS — Z23 Encounter for immunization: Secondary | ICD-10-CM | POA: Diagnosis present

## 2017-08-27 MED ORDER — INFLUENZA VAC SPLIT QUAD 0.5 ML IM SUSY
PREFILLED_SYRINGE | INTRAMUSCULAR | Status: AC
  Filled 2017-08-27: qty 0.5

## 2017-08-27 MED ORDER — INFLUENZA VAC SPLIT QUAD 0.5 ML IM SUSY
0.5000 mL | PREFILLED_SYRINGE | Freq: Once | INTRAMUSCULAR | Status: AC
  Administered 2017-08-27: 0.5 mL via INTRAMUSCULAR

## 2017-08-27 NOTE — Patient Instructions (Signed)
[place] at [facility] Discharge Instructions  RECOMMENDATIONS MADE BY THE CONSULTANT AND ANY TEST RESULTS WILL BE SENT TO YOUR REFERRING PHYSICIAN.  You received your Flu shot today Follow up as scheduled.  Thank you for choosing [place] at [facility] to provide your oncology and hematology care.  To afford each patient quality time with our provider, please arrive at least 15 minutes before your scheduled appointment time.    If you have a lab appointment with the [facility] please come in thru the  Main Entrance and check in at the main information desk  You need to re-schedule your appointment should you arrive 10 or more minutes late.  We strive to give you quality time with our providers, and arriving late affects you and other patients whose appointments are after yours.  Also, if you no show three or more times for appointments you may be dismissed from the clinic at the providers discretion.     Again, thank you for choosing [facility].  Our hope is that these requests will decrease the amount of time that you wait before being seen by our physicians.       _____________________________________________________________  Should you have questions after your visit to [facility], please contact our office at [phone] between the hours of 8:30 a.m. and 4:30 p.m.  Voicemails left after 4:30 p.m. will not be returned until the following business day.  For prescription refill requests, have your pharmacy contact our office.       Resources For Cancer Patients and their Caregivers ? American Cancer Society: Can assist with transportation, wigs, general needs, runs Look Good Feel Better.        515 418 9564 ? Cancer Care: Provides financial assistance, online support groups, medication/co-pay assistance.  1-800-813-HOPE (956)349-2847) ? [facility] Assists [place] cancer  patients and their families through emotional , educational and financial support.  6093345321 ? [place] DSS Where to apply for food stamps, Medicaid and utility assistance. 774-444-1247 ? [facility]: Transportation to medical appointments. (715) 420-6378 ? Social Security Administration: May apply for disability if have a Stage IV cancer. 478-656-6340 470-141-0071 ? [employer], Disability and Transit Services: Assists with nutrition, care and transit needs. [place] Support Programs: @10RELATIVEDAYS @ > Cancer Support Group  2nd Tuesday of the month 1pm-2pm, Journey Room  > Creative Journey  3rd Tuesday of the month 1130am-1pm, Journey Room  > Look Good Feel Better  1st Wednesday of the month 10am-12 noon, Journey Room (Call [place] to register [phone])

## 2017-08-27 NOTE — Progress Notes (Signed)
Jeremy Parrish presents today for injection per MD orders. Fluarix administered IM in right deltoid. Administration without incident. Patient tolerated well. Patient tolerated treatment without incidence. Patient discharged ambulatory and in stable condition from clinic. Patient to follow up as scheduled.

## 2017-09-04 ENCOUNTER — Other Ambulatory Visit: Payer: Self-pay | Admitting: Cardiovascular Disease

## 2017-09-10 ENCOUNTER — Other Ambulatory Visit (HOSPITAL_COMMUNITY): Payer: Self-pay | Admitting: *Deleted

## 2017-09-10 DIAGNOSIS — D801 Nonfamilial hypogammaglobulinemia: Secondary | ICD-10-CM

## 2017-09-10 DIAGNOSIS — C9001 Multiple myeloma in remission: Secondary | ICD-10-CM

## 2017-09-11 ENCOUNTER — Encounter (HOSPITAL_COMMUNITY): Payer: Medicare Other | Attending: Oncology

## 2017-09-11 ENCOUNTER — Encounter (HOSPITAL_BASED_OUTPATIENT_CLINIC_OR_DEPARTMENT_OTHER): Payer: Medicare Other

## 2017-09-11 ENCOUNTER — Encounter (HOSPITAL_COMMUNITY): Payer: Self-pay

## 2017-09-11 VITALS — BP 142/77 | HR 60 | Temp 97.6°F | Resp 18 | Wt 193.6 lb

## 2017-09-11 DIAGNOSIS — D801 Nonfamilial hypogammaglobulinemia: Secondary | ICD-10-CM | POA: Diagnosis present

## 2017-09-11 DIAGNOSIS — E538 Deficiency of other specified B group vitamins: Secondary | ICD-10-CM | POA: Diagnosis not present

## 2017-09-11 DIAGNOSIS — C9001 Multiple myeloma in remission: Secondary | ICD-10-CM

## 2017-09-11 LAB — COMPREHENSIVE METABOLIC PANEL
ALK PHOS: 68 U/L (ref 38–126)
ALT: 13 U/L — AB (ref 17–63)
AST: 21 U/L (ref 15–41)
Albumin: 3.7 g/dL (ref 3.5–5.0)
Anion gap: 8 (ref 5–15)
BUN: 10 mg/dL (ref 6–20)
CALCIUM: 9.1 mg/dL (ref 8.9–10.3)
CO2: 26 mmol/L (ref 22–32)
CREATININE: 0.96 mg/dL (ref 0.61–1.24)
Chloride: 103 mmol/L (ref 101–111)
GFR calc non Af Amer: >60 mL/min (ref >60)
Glucose, Bld: 111 mg/dL — ABNORMAL HIGH (ref 65–99)
Potassium: 4.1 mmol/L (ref 3.5–5.1)
SODIUM: 137 mmol/L (ref 135–145)
Total Bilirubin: 0.6 mg/dL (ref 0.3–1.2)
Total Protein: 6.9 g/dL (ref 6.5–8.1)

## 2017-09-11 LAB — CBC WITH DIFFERENTIAL/PLATELET
BASOS ABS: 0.1 10*3/uL (ref 0.0–0.1)
Basophils Relative: 1 %
EOS ABS: 0.1 10*3/uL (ref 0.0–0.7)
Eosinophils Relative: 2 %
HCT: 37.8 % — ABNORMAL LOW (ref 39.0–52.0)
HEMOGLOBIN: 12.1 g/dL — AB (ref 13.0–17.0)
LYMPHS ABS: 1.6 10*3/uL (ref 0.7–4.0)
LYMPHS PCT: 35 %
MCH: 28.1 pg (ref 26.0–34.0)
MCHC: 32 g/dL (ref 30.0–36.0)
MCV: 87.9 fL (ref 78.0–100.0)
Monocytes Absolute: 0.5 10*3/uL (ref 0.1–1.0)
Monocytes Relative: 12 %
NEUTROS PCT: 50 %
Neutro Abs: 2.2 10*3/uL (ref 1.7–7.7)
Platelets: 116 10*3/uL — ABNORMAL LOW (ref 150–400)
RBC: 4.3 MIL/uL (ref 4.22–5.81)
RDW: 15.4 % (ref 11.5–15.5)
WBC: 4.5 10*3/uL (ref 4.0–10.5)

## 2017-09-11 LAB — LACTATE DEHYDROGENASE: LDH: 139 U/L (ref 98–192)

## 2017-09-11 MED ORDER — HEPARIN SOD (PORK) LOCK FLUSH 100 UNIT/ML IV SOLN
INTRAVENOUS | Status: AC
  Filled 2017-09-11: qty 5

## 2017-09-11 MED ORDER — ACETAMINOPHEN 325 MG PO TABS
650.0000 mg | ORAL_TABLET | Freq: Once | ORAL | Status: AC
  Administered 2017-09-11: 650 mg via ORAL

## 2017-09-11 MED ORDER — HEPARIN SOD (PORK) LOCK FLUSH 100 UNIT/ML IV SOLN
500.0000 [IU] | Freq: Once | INTRAVENOUS | Status: AC | PRN
  Administered 2017-09-11: 500 [IU]

## 2017-09-11 MED ORDER — CYANOCOBALAMIN 1000 MCG/ML IJ SOLN
1000.0000 ug | Freq: Once | INTRAMUSCULAR | Status: AC
  Administered 2017-09-11: 1000 ug via INTRAMUSCULAR
  Filled 2017-09-11: qty 1

## 2017-09-11 MED ORDER — SODIUM CHLORIDE 0.9 % IJ SOLN
10.0000 mL | INTRAMUSCULAR | Status: DC | PRN
  Administered 2017-09-11: 10 mL
  Filled 2017-09-11: qty 10

## 2017-09-11 MED ORDER — DEXTROSE 5 % IV SOLN
INTRAVENOUS | Status: DC
  Administered 2017-09-11: 09:00:00 via INTRAVENOUS

## 2017-09-11 MED ORDER — ACETAMINOPHEN 325 MG PO TABS
ORAL_TABLET | ORAL | Status: AC
  Filled 2017-09-11: qty 2

## 2017-09-11 MED ORDER — IMMUNE GLOBULIN (HUMAN) 20 GM/200ML IV SOLN
400.0000 mg/kg | Freq: Once | INTRAVENOUS | Status: AC
  Administered 2017-09-11: 35 g via INTRAVENOUS
  Filled 2017-09-11: qty 100

## 2017-09-11 NOTE — Progress Notes (Signed)
To treatment area for IVIG and monthly B12 shot today.  No complaints voiced today.    Patient tolerated IVIG and B12 shot with no complaints voiced.  Port site clean and dry with no bruising or swelling noted at site.  Band aid applied.  B12 injection site clean and dry with band aid.  VSS with discharge and left ambulatory with no s/s of distress noted.

## 2017-09-11 NOTE — Patient Instructions (Signed)
[place] at [facility]  Discharge Instructions:  You received IVIG today and a Vitamin b12 shot today.  _______________________________________________________________  Thank you for choosing [place] at [facility] to provide your oncology and hematology care.  To afford each patient quality time with our providers, please arrive at least 15 minutes before your scheduled appointment.  You need to re-schedule your appointment if you arrive 10 or more minutes late.  We strive to give you quality time with our providers, and arriving late affects you and other patients whose appointments are after yours.  Also, if you no show three or more times for appointments you may be dismissed from the clinic.  Again, thank you for choosing [place] at [place] hope is that these requests will allow you access to exceptional care and in a timely manner. _______________________________________________________________  If you have questions after your visit, please contact our office at [phone] between the hours of 8:30 a.m. and 5:00 p.m. Voicemails left after 4:30 p.m. will not be returned until the following business day. _______________________________________________________________  For prescription refill requests, have your pharmacy contact our office. _______________________________________________________________  Recommendations made by the consultant and any test results will be sent to your referring physician. _______________________________________________________________

## 2017-09-12 LAB — IGG, IGA, IGM
IGG (IMMUNOGLOBIN G), SERUM: 1026 mg/dL (ref 700–1600)
IgA: 425 mg/dL (ref 61–437)
IgM (Immunoglobulin M), Srm: 35 mg/dL (ref 15–143)

## 2017-09-12 LAB — KAPPA/LAMBDA LIGHT CHAINS
KAPPA, LAMDA LIGHT CHAIN RATIO: 1.34 (ref 0.26–1.65)
Kappa free light chain: 38.9 mg/L — ABNORMAL HIGH (ref 3.3–19.4)
Lambda free light chains: 29 mg/L — ABNORMAL HIGH (ref 5.7–26.3)

## 2017-09-12 LAB — PROTEIN ELECTROPHORESIS, SERUM
A/G Ratio: 1.1 (ref 0.7–1.7)
ALPHA-1-GLOBULIN: 0.2 g/dL (ref 0.0–0.4)
ALPHA-2-GLOBULIN: 0.7 g/dL (ref 0.4–1.0)
Albumin ELP: 3.3 g/dL (ref 2.9–4.4)
Beta Globulin: 1 g/dL (ref 0.7–1.3)
GAMMA GLOBULIN: 1.2 g/dL (ref 0.4–1.8)
Globulin, Total: 3.1 g/dL (ref 2.2–3.9)
Total Protein ELP: 6.4 g/dL (ref 6.0–8.5)

## 2017-09-15 LAB — IMMUNOFIXATION ELECTROPHORESIS
IGG (IMMUNOGLOBIN G), SERUM: 1021 mg/dL (ref 700–1600)
IgA: 405 mg/dL (ref 61–437)
IgM (Immunoglobulin M), Srm: 33 mg/dL (ref 15–143)
TOTAL PROTEIN ELP: 6.6 g/dL (ref 6.0–8.5)

## 2017-09-18 ENCOUNTER — Other Ambulatory Visit (HOSPITAL_COMMUNITY): Payer: Self-pay

## 2017-09-18 DIAGNOSIS — C9001 Multiple myeloma in remission: Secondary | ICD-10-CM

## 2017-09-18 MED ORDER — LENALIDOMIDE 10 MG PO CAPS: ORAL_CAPSULE | ORAL | 0 refills | Status: DC

## 2017-09-18 NOTE — Telephone Encounter (Signed)
Received refill request form financial counselor, who handles the oral chemo drug refills, that patient needed refill on revelimid. Reviewed with provider, prescription printed and given to financial counselor.

## 2017-10-06 ENCOUNTER — Telehealth: Payer: Self-pay | Admitting: Cardiology

## 2017-10-06 ENCOUNTER — Ambulatory Visit (INDEPENDENT_AMBULATORY_CARE_PROVIDER_SITE_OTHER): Payer: Medicare Other | Admitting: *Deleted

## 2017-10-06 DIAGNOSIS — I495 Sick sinus syndrome: Secondary | ICD-10-CM | POA: Diagnosis not present

## 2017-10-06 NOTE — Progress Notes (Signed)
Remote pacemaker transmission.   

## 2017-10-06 NOTE — Telephone Encounter (Signed)
Spoke with pt and reminded pt of remote transmission that is due today. Pt verbalized understanding.   

## 2017-10-08 ENCOUNTER — Other Ambulatory Visit (HOSPITAL_COMMUNITY): Payer: Self-pay | Admitting: Emergency Medicine

## 2017-10-08 DIAGNOSIS — C9001 Multiple myeloma in remission: Secondary | ICD-10-CM

## 2017-10-08 MED ORDER — LENALIDOMIDE 10 MG PO CAPS: ORAL_CAPSULE | ORAL | 0 refills | Status: DC

## 2017-10-09 LAB — CUP PACEART REMOTE DEVICE CHECK
Battery Voltage: 2.96 V
Date Time Interrogation Session: 20181126181359
Implantable Lead Implant Date: 20120910
Implantable Lead Location: 753859
Implantable Pulse Generator Implant Date: 20120910
Lead Channel Impedance Value: 372 Ohm
Lead Channel Sensing Intrinsic Amplitude: 3.366 mV
Lead Channel Setting Pacing Pulse Width: 0.6 ms
MDC IDC LEAD IMPLANT DT: 20120910
MDC IDC LEAD LOCATION: 753860
MDC IDC MSMT LEADCHNL RA IMPEDANCE VALUE: 408 Ohm
MDC IDC MSMT LEADCHNL RA SENSING INTR AMPL: 1.508 mV
MDC IDC SET LEADCHNL RA PACING AMPLITUDE: 2 V
MDC IDC SET LEADCHNL RV PACING AMPLITUDE: 2.5 V
MDC IDC SET LEADCHNL RV SENSING SENSITIVITY: 0.9 mV
MDC IDC STAT BRADY AP VP PERCENT: 0.39 %
MDC IDC STAT BRADY AP VS PERCENT: 94.85 %
MDC IDC STAT BRADY AS VP PERCENT: 0.02 %
MDC IDC STAT BRADY AS VS PERCENT: 4.73 %
MDC IDC STAT BRADY RA PERCENT PACED: 94.21 %
MDC IDC STAT BRADY RV PERCENT PACED: 0.43 %

## 2017-10-10 ENCOUNTER — Encounter: Payer: Self-pay | Admitting: Cardiology

## 2017-10-13 ENCOUNTER — Encounter (HOSPITAL_COMMUNITY): Payer: Medicare Other | Attending: Oncology

## 2017-10-13 ENCOUNTER — Encounter (HOSPITAL_BASED_OUTPATIENT_CLINIC_OR_DEPARTMENT_OTHER): Payer: Medicare Other

## 2017-10-13 ENCOUNTER — Encounter (HOSPITAL_COMMUNITY): Payer: Self-pay | Admitting: Oncology

## 2017-10-13 ENCOUNTER — Encounter (HOSPITAL_COMMUNITY): Payer: Self-pay

## 2017-10-13 ENCOUNTER — Other Ambulatory Visit: Payer: Self-pay

## 2017-10-13 ENCOUNTER — Encounter (HOSPITAL_BASED_OUTPATIENT_CLINIC_OR_DEPARTMENT_OTHER): Payer: Medicare Other | Admitting: Oncology

## 2017-10-13 VITALS — BP 134/65 | HR 58 | Temp 98.0°F | Resp 20 | Wt 193.6 lb

## 2017-10-13 DIAGNOSIS — I255 Ischemic cardiomyopathy: Secondary | ICD-10-CM | POA: Diagnosis not present

## 2017-10-13 DIAGNOSIS — Z8572 Personal history of non-Hodgkin lymphomas: Secondary | ICD-10-CM

## 2017-10-13 DIAGNOSIS — Z8551 Personal history of malignant neoplasm of bladder: Secondary | ICD-10-CM | POA: Insufficient documentation

## 2017-10-13 DIAGNOSIS — D801 Nonfamilial hypogammaglobulinemia: Secondary | ICD-10-CM

## 2017-10-13 DIAGNOSIS — Z79899 Other long term (current) drug therapy: Secondary | ICD-10-CM | POA: Insufficient documentation

## 2017-10-13 DIAGNOSIS — Z950 Presence of cardiac pacemaker: Secondary | ICD-10-CM | POA: Insufficient documentation

## 2017-10-13 DIAGNOSIS — C9001 Multiple myeloma in remission: Secondary | ICD-10-CM | POA: Insufficient documentation

## 2017-10-13 DIAGNOSIS — E538 Deficiency of other specified B group vitamins: Secondary | ICD-10-CM | POA: Diagnosis not present

## 2017-10-13 DIAGNOSIS — J449 Chronic obstructive pulmonary disease, unspecified: Secondary | ICD-10-CM | POA: Diagnosis not present

## 2017-10-13 DIAGNOSIS — Z9484 Stem cells transplant status: Secondary | ICD-10-CM

## 2017-10-13 DIAGNOSIS — M2550 Pain in unspecified joint: Secondary | ICD-10-CM

## 2017-10-13 DIAGNOSIS — I110 Hypertensive heart disease with heart failure: Secondary | ICD-10-CM | POA: Diagnosis not present

## 2017-10-13 DIAGNOSIS — I509 Heart failure, unspecified: Secondary | ICD-10-CM | POA: Diagnosis not present

## 2017-10-13 LAB — COMPREHENSIVE METABOLIC PANEL
ALT: 15 U/L — ABNORMAL LOW (ref 17–63)
ANION GAP: 8 (ref 5–15)
AST: 24 U/L (ref 15–41)
Albumin: 3.6 g/dL (ref 3.5–5.0)
Alkaline Phosphatase: 70 U/L (ref 38–126)
BUN: 9 mg/dL (ref 6–20)
CALCIUM: 8.8 mg/dL — AB (ref 8.9–10.3)
CO2: 22 mmol/L (ref 22–32)
CREATININE: 1.03 mg/dL (ref 0.61–1.24)
Chloride: 106 mmol/L (ref 101–111)
GFR calc non Af Amer: >60 mL/min (ref >60)
GLUCOSE: 123 mg/dL — AB (ref 65–99)
POTASSIUM: 3.7 mmol/L (ref 3.5–5.1)
SODIUM: 136 mmol/L (ref 135–145)
Total Bilirubin: 0.7 mg/dL (ref 0.3–1.2)
Total Protein: 6.6 g/dL (ref 6.5–8.1)

## 2017-10-13 LAB — CBC WITH DIFFERENTIAL/PLATELET
Basophils Absolute: 0 10*3/uL (ref 0.0–0.1)
Basophils Relative: 1 %
EOS ABS: 0.1 10*3/uL (ref 0.0–0.7)
EOS PCT: 4 %
HCT: 36.5 % — ABNORMAL LOW (ref 39.0–52.0)
Hemoglobin: 11.6 g/dL — ABNORMAL LOW (ref 13.0–17.0)
LYMPHS ABS: 1.4 10*3/uL (ref 0.7–4.0)
Lymphocytes Relative: 38 %
MCH: 28.4 pg (ref 26.0–34.0)
MCHC: 31.8 g/dL (ref 30.0–36.0)
MCV: 89.2 fL (ref 78.0–100.0)
MONOS PCT: 12 %
Monocytes Absolute: 0.4 10*3/uL (ref 0.1–1.0)
Neutro Abs: 1.6 10*3/uL — ABNORMAL LOW (ref 1.7–7.7)
Neutrophils Relative %: 45 %
PLATELETS: 104 10*3/uL — AB (ref 150–400)
RBC: 4.09 MIL/uL — ABNORMAL LOW (ref 4.22–5.81)
RDW: 16.1 % — ABNORMAL HIGH (ref 11.5–15.5)
WBC: 3.6 10*3/uL — AB (ref 4.0–10.5)

## 2017-10-13 LAB — LACTATE DEHYDROGENASE: LDH: 138 U/L (ref 98–192)

## 2017-10-13 MED ORDER — HEPARIN SOD (PORK) LOCK FLUSH 100 UNIT/ML IV SOLN
500.0000 [IU] | Freq: Once | INTRAVENOUS | Status: AC | PRN
  Administered 2017-10-13: 500 [IU]
  Filled 2017-10-13: qty 5

## 2017-10-13 MED ORDER — CYANOCOBALAMIN 1000 MCG/ML IJ SOLN
1000.0000 ug | Freq: Once | INTRAMUSCULAR | Status: AC
  Administered 2017-10-13: 1000 ug via INTRAMUSCULAR
  Filled 2017-10-13: qty 1

## 2017-10-13 MED ORDER — DEXTROSE 5 % IV SOLN
INTRAVENOUS | Status: DC
  Administered 2017-10-13: 10:00:00 via INTRAVENOUS

## 2017-10-13 MED ORDER — ACETAMINOPHEN 325 MG PO TABS
650.0000 mg | ORAL_TABLET | Freq: Once | ORAL | Status: AC
  Administered 2017-10-13: 650 mg via ORAL
  Filled 2017-10-13: qty 2

## 2017-10-13 MED ORDER — SODIUM CHLORIDE 0.9 % IJ SOLN
10.0000 mL | INTRAMUSCULAR | Status: DC | PRN
  Administered 2017-10-13: 10 mL
  Filled 2017-10-13: qty 10

## 2017-10-13 MED ORDER — IMMUNE GLOBULIN (HUMAN) 20 GM/200ML IV SOLN
400.0000 mg/kg | Freq: Once | INTRAVENOUS | Status: AC
  Administered 2017-10-13: 35 g via INTRAVENOUS
  Filled 2017-10-13: qty 100

## 2017-10-13 MED ORDER — HYDROCODONE-ACETAMINOPHEN 5-325 MG PO TABS: 1.0000 | ORAL_TABLET | Freq: Four times a day (QID) | ORAL | 0 refills | Status: DC | PRN

## 2017-10-13 NOTE — Progress Notes (Signed)
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 Marcell Parrish. 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 In 2011 he was diagnosed with IgG lambda multiple myeloma and treated with 4 cycles of Velcade and dexamethasone followed by autologous peripheral blood stem cell transplant and currently remains on maintenance Revlimid and also monthly intravenous IgG.  Zometa 4 mg intravenously monthly was discontinued with last treatment on 08/18/2014.   DIAGNOSIS: IgG lambda Myeloma   CURRENT THERAPY: IVIG  Revlimid 10 mg 7 on/7 off  INTERVAL HISTORY: Jeremy Parrish 76 y.o. 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  Overall he has been doing about the same. He continues to takes his Revlimid 1 week on, 1 week off. He has some SOB occasionally, as well as fatigue. Denies fever, chest pain, abdominal pain, loss of appetite. He has pain in his bilateral shoulders. He has neuropathy which is chronic in his fingers and toes. He has occasional leg swelling. He denies any recent infections.  Past Medical History:  Diagnosis Date  . Anemia   . Aortic aneurysm of unspecified site without mention of rupture   . Arthritis   . Bladder neck contracture   . Cancer ([place])   . Cerebral atherosclerosis    Carotid Doppler, 02/16/2013 - Bilateral Proximal ICAs,demonstrate mild plaque w/o evidence of significant diameter  reduction, dissection, or any other vascular abnormality  . CHF (congestive heart failure) ([place])   . Complication of anesthesia   . COPD (chronic obstructive pulmonary disease) ([place])   . Coronary artery disease   . Depression   . Esophageal reflux   . Heart disease   . Heart murmur   . Hx of bladder cancer 10/07/2012  . Hyperlipidemia   . Hypertension   . Hypogammaglobulinemia ([place]) 09/28/2012   Secondary to Lymphoma and Multiple Myeloma and their treatments  . Intestinovesical fistula   . Kidney stones    history  . Lung mass   . Multiple myeloma   . Myocardial infarction [facility])    '96  . Non Hodgkin's lymphoma ([place])   . Paroxysmal atrial fibrillation ([place]) 01/02/2016  . Peripheral arterial disease ([place])   . Personal history of other diseases of circulatory system   . PONV (postoperative nausea and vomiting)   . Prostate cancer ([place]) 2000  . Shingles   . Shortness of breath   . Sleep apnea    05-02-14 cpap , not yet used- suggested settings 5  . Stroke [facility]) 2013   Speech.    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; H/O cardiac pacemaker, Medtronic REVO, MRI conditional device, placed 07/2011 for sympyomatic bradycardia; Hx of bladder cancer; Hyperlipidemia with target LDL less than 100; Prediabetes;  Expressive aphasia; Hemiplegia affecting right dominant side ([place]); Aneurysm of iliac artery ([place]); Shortness of breath; Rotator cuff tear arthropathy of right shoulder; Left rotator cuff tear arthropathy; Muscle weakness (generalized); Status post arthroscopy of shoulder; Pain in joint, shoulder region; Fever; Pneumonia;  Pancytopenia ([place]); Chest pain with moderate risk for cardiac etiology; Inguinal hernia; Obstructive sleep apnea; Central sleep apnea; Hypokalemia; URI (upper respiratory infection); Weakness; Paroxysmal atrial fibrillation (HCC); COPD (chronic obstructive pulmonary disease) ([place]); Long term current use of anticoagulant; and Vitamin B12 deficiency on their problem list.     is allergic to morphine and related and tape.  Jeremy Parrish had no medications administered during this visit.  Past Surgical History:  Procedure Laterality Date  . BLADDER SURGERY    . BONE MARROW TRANSPLANT  2011  . COLON SURGERY     colon resection  . COLONOSCOPY N/A 01/01/2013   Procedure: COLONOSCOPY;  Surgeon: Rogene Houston, MD;  Location[facility];  Service: Endoscopy;  Laterality: N/A;  825-moved to [place] notified pt  . CORONARY ANGIOPLASTY  06/24/2000   PCI and stenting in mid & proximal RCA  . heart stents x 5  1999  . INGUINAL HERNIA REPAIR Right 05/04/2014   Procedure: OPEN RIGHT INGUINAL HERNIA REPAIR with mesh;  Surgeon: Edward Jolly, MD;  Location: [facility] ORS;  Service: General;  Laterality: Right;  . INSERT / REPLACE / REMOVE PACEMAKER    . left ear skin cancer removed    . NM MYOCAR PERF WALL MOTION  11/27/2007   inferior scar  . PACEMAKER INSERTION  07/22/2011   Medtronic  . PORTACATH PLACEMENT  07/26/2009   right chest  . PROSTATE SURGERY    . Rotator    . ROTATOR CUFF REPAIR Right   . SHOULDER ARTHROSCOPY WITH SUBACROMIAL DECOMPRESSION Right 07/21/2013   Procedure: RIGHT SHOULDER ARTHROSCOPY WITH SUBACROMIAL DECOMPRESSION AND DEBRIDEMENT & Injection of Left Shoulder;  Surgeon: Alta Corning, MD;  Location: [facility];  Service: Orthopedics;  Laterality: Right;  . TEE WITHOUT CARDIOVERSION  10/13/2012   Procedure: TRANSESOPHAGEAL ECHOCARDIOGRAM (TEE);  Surgeon: Sanda Klein, MD;  Location: [facility];  Service: Cardiovascular;  Laterality: N/A;  pat/kay/echo notified  . US  ECHOCARDIOGRAPHY  06/19/2011   RV mildly dilated,mild to mod. MR,mild AI,mild PI  . WRIST SURGERY     right    Review of Systems  Constitutional: Positive for malaise/fatigue. Negative for fever.       No loss of appetite   HENT: Negative.   Eyes: Negative.   Respiratory: Positive for shortness of breath.   Cardiovascular: Negative.  Negative for chest pain.  Gastrointestinal: Negative.  Negative for abdominal pain.  Genitourinary: Negative.   Musculoskeletal: Negative.   Skin: Negative.   Neurological: Negative.   Endo/Heme/Allergies: Negative.   Psychiatric/Behavioral: Negative.   All other systems reviewed and are negative. 14 point review of systems was performed and is negative except as detailed under history of present illness and above  PHYSICAL EXAMINATION ECOG PERFORMANCE STATUS: 1 - Symptomatic but completely ambulatory  BP127/86, P 63, RR 18, T 98.1, O2 sat 99%.  Physical Exam  Constitutional: He is oriented to person, place, and time and well-developed, well-nourished, and in no distress.  HENT:  Head: Normocephalic and atraumatic.  Mouth/Throat: Oropharynx is clear and moist.  Eyes: Conjunctivae and EOM are normal. Pupils are equal, round, and reactive to light.  Neck: Normal range of motion. Neck supple.  Cardiovascular: Normal rate, regular rhythm and normal  heart sounds.  Pulmonary/Chest: Effort normal and breath sounds normal.  Abdominal: Soft. Bowel sounds are normal.  Musculoskeletal: Normal range of motion.  Neurological: He is alert and oriented to person, place, and time. Gait normal.  Skin: Skin is warm and dry.  Nursing note and vitals reviewed.  LABORATORY DATA: I have reviewed the data as listed  CBC Latest Ref Rng & Units 10/13/2017 09/11/2017 08/11/2017  WBC 4.0 - 10.5 K/uL 3.6(L) 4.5 3.9(L)  Hemoglobin 13.0 - 17.0 g/dL 11.6(L) 12.1(L) 12.0(L)  Hematocrit 39.0 - 52.0 % 36.5(L) 37.8(L) 36.9(L)  Platelets 150 - 400 K/uL PENDING 116(L) 110(L)    CMP Latest Ref Rng & Units 10/13/2017 09/11/2017 08/11/2017  Glucose 65 - 99 mg/dL 123(H) 111(H) 173(H)  BUN 6 - 20 mg/dL _0 Creatinine 0.61 - 1.24 mg/dL 1.03 0.96 1.04  Sodium 135 - 145 mmol/L 136 137 137  Potassium 3.5 - 5.1 mmol/L 3.7 4.1 3.6  Chloride 101 - 111 mmol/L 106 103 106  CO2 22 - 32 mmol/L _1 Calcium 8.9 - 10.3 mg/dL 8.8(L) 9.1 8.7(L)  Total Protein 6.5 - 8.1 g/dL 6.6 6.9 6.6  Total Bilirubin 0.3 - 1.2 mg/dL 0.7 0.6 0.6  Alkaline Phos 38 - 126 U/L 70 68 76  AST 15 - 41 U/L _2 ALT 17 - 63 U/L 15(L) 13(L) 15(L)   ASSESSMENT AND PLAN:  IgG lambda myeloma on maintenance revlimid since 2011. 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 No evidence of recurrence of his MM. Continue maintenance revlimid. Last SPEP was negative for monoclonal paraprotein on 09/11/17.   Continue monthly low dose IVIG for hypogammaglobulinemia and monthly B12 injections.   He will return for follow up in 3 months for follow up withh CBC, CMP, free light chains, SPEP, B12.    Refilled his pain meds today.  Orders Placed This Encounter  Procedures  . CBC with Differential    Standing Status:   Future    Standing Expiration Date:   10/13/2018  . Comprehensive metabolic panel    Standing Status:   Future    Standing Expiration Date:   10/13/2018  . Multiple Myeloma Panel (SPEP&IFE w/QIG)    Standing Status:   Future    Standing Expiration Date:   10/13/2018  . Kappa/lambda light chains    Standing Status:   Future    Standing Expiration Date:   10/13/2018  . Vitamin B12    Standing Status:   Future    Standing Expiration Date:   10/13/2018    All questions were answered. The patient knows to call the clinic with any problems, questions or  concerns. We can certainly see the patient much sooner if necessary.   Twana First, MD  10/13/2017 9:45 AM

## 2017-10-13 NOTE — Progress Notes (Signed)
Labs reviewed with Dr. Talbert Cage and ok to treat today. Seen by oncologist for follow up visit.    Patient tolerated IVIG with no complaints voiced.  Port site clean and dry with no bruising or swelling noted at site.  Band aid applied.  VSS with discharge and left ambulatory with no s/s of distress noted.

## 2017-10-13 NOTE — Patient Instructions (Signed)
[facility] at [facility] Discharge Instructions  RECOMMENDATIONS MADE BY THE CONSULTANT AND ANY TEST RESULTS WILL BE SENT TO YOUR REFERRING PHYSICIAN.  You were seen today by Dr. Louise Zhou    Thank you for choosing [facility] at [facility] to provide your oncology and hematology care.  To afford each patient quality time with our provider, please arrive at least 15 minutes before your scheduled appointment time.    If you have a lab appointment with the Cancer Center please come in thru the  Main Entrance and check in at the main information desk  You need to re-schedule your appointment should you arrive 10 or more minutes late.  We strive to give you quality time with our providers, and arriving late affects you and other patients whose appointments are after yours.  Also, if you no show three or more times for appointments you may be dismissed from the clinic at the providers discretion.     Again, thank you for choosing [facility].  Our hope is that these requests will decrease the amount of time that you wait before being seen by our physicians.       _____________________________________________________________  Should you have questions after your visit to [facility], please contact our office at [phone] between the hours of 8:30 a.m. and 4:30 p.m.  Voicemails left after 4:30 p.m. will not be returned until the following business day.  For prescription refill requests, have your pharmacy contact our office.       Resources For Cancer Patients and their Caregivers ? American Cancer Society: Can assist with transportation, wigs, general needs, runs Look Good Feel Better.        1-888-227-6333 ? Cancer Care: Provides financial assistance, online support groups, medication/co-pay assistance.  1-800-813-HOPE (4673) ? [facility] Assists [place] cancer patients and their  families through emotional , educational and financial support.  336-427-4357 ? [place] DSS Where to apply for food stamps, Medicaid and utility assistance. 336-342-1394 ? RCATS: Transportation to medical appointments. 336-347-2287 ? Social Security Administration: May apply for disability if have a Stage IV cancer. 336-342-7796 1-800-772-1213 ? [place] Aging, Disability and Transit Services: Assists with nutrition, care and transit needs. 336-349-2343  Cancer Center Support Programs: @10RELATIVEDAYS@ > Cancer Support Group  2nd Tuesday of the month 1pm-2pm, Journey Room  > Creative Journey  3rd Tuesday of the month 1130am-1pm, Journey Room  > Look Good Feel Better  1st Wednesday of the month 10am-12 noon, Journey Room (Call American Cancer [employer] to register 1-800-395-5775)    

## 2017-10-13 NOTE — Patient Instructions (Signed)
[facility] at [facility]  Discharge Instructions:  You received IVIG today and a b12 shot.  _______________________________________________________________  Thank you for choosing [facility] at [facility] to provide your oncology and hematology care.  To afford each patient quality time with our providers, please arrive at least 15 minutes before your scheduled appointment.  You need to re-schedule your appointment if you arrive 10 or more minutes late.  We strive to give you quality time with our providers, and arriving late affects you and other patients whose appointments are after yours.  Also, if you no show three or more times for appointments you may be dismissed from the clinic.  Again, thank you for choosing [facility] at [facility]. Our hope is that these requests will allow you access to exceptional care and in a timely manner. _______________________________________________________________  If you have questions after your visit, please contact our office at [phone] between the hours of 8:30 a.m. and 5:00 p.m. Voicemails left after 4:30 p.m. will not be returned until the following business day. _______________________________________________________________  For prescription refill requests, have your pharmacy contact our office. _______________________________________________________________  Recommendations made by the consultant and any test results will be sent to your referring physician. _______________________________________________________________ 

## 2017-10-14 DIAGNOSIS — D485 Neoplasm of uncertain behavior of skin: Secondary | ICD-10-CM | POA: Diagnosis not present

## 2017-10-14 DIAGNOSIS — L821 Other seborrheic keratosis: Secondary | ICD-10-CM | POA: Diagnosis not present

## 2017-10-14 DIAGNOSIS — C44629 Squamous cell carcinoma of skin of left upper limb, including shoulder: Secondary | ICD-10-CM | POA: Diagnosis not present

## 2017-10-14 DIAGNOSIS — L570 Actinic keratosis: Secondary | ICD-10-CM | POA: Diagnosis not present

## 2017-10-14 DIAGNOSIS — C44319 Basal cell carcinoma of skin of other parts of face: Secondary | ICD-10-CM | POA: Diagnosis not present

## 2017-10-14 DIAGNOSIS — Z85828 Personal history of other malignant neoplasm of skin: Secondary | ICD-10-CM | POA: Diagnosis not present

## 2017-10-14 LAB — PROTEIN ELECTROPHORESIS, SERUM
A/G Ratio: 1.1 (ref 0.7–1.7)
ALBUMIN ELP: 3.2 g/dL (ref 2.9–4.4)
ALPHA-1-GLOBULIN: 0.2 g/dL (ref 0.0–0.4)
Alpha-2-Globulin: 0.6 g/dL (ref 0.4–1.0)
BETA GLOBULIN: 1 g/dL (ref 0.7–1.3)
GAMMA GLOBULIN: 1.1 g/dL (ref 0.4–1.8)
Globulin, Total: 2.9 g/dL (ref 2.2–3.9)
TOTAL PROTEIN ELP: 6.1 g/dL (ref 6.0–8.5)

## 2017-10-14 LAB — KAPPA/LAMBDA LIGHT CHAINS
KAPPA FREE LGHT CHN: 42.6 mg/L — AB (ref 3.3–19.4)
KAPPA, LAMDA LIGHT CHAIN RATIO: 1.39 (ref 0.26–1.65)
LAMDA FREE LIGHT CHAINS: 30.7 mg/L — AB (ref 5.7–26.3)

## 2017-10-14 LAB — IGG, IGA, IGM
IgA: 415 mg/dL (ref 61–437)
IgG (Immunoglobin G), Serum: 954 mg/dL (ref 700–1600)
IgM (Immunoglobulin M), Srm: 29 mg/dL (ref 15–143)

## 2017-10-14 MED ORDER — OCTREOTIDE ACETATE 30 MG IM KIT
PACK | INTRAMUSCULAR | Status: AC
  Filled 2017-10-14: qty 1

## 2017-10-16 LAB — IMMUNOFIXATION ELECTROPHORESIS
IGG (IMMUNOGLOBIN G), SERUM: 948 mg/dL (ref 700–1600)
IgA: 417 mg/dL (ref 61–437)
IgM (Immunoglobulin M), Srm: 30 mg/dL (ref 15–143)
Total Protein ELP: 6.2 g/dL (ref 6.0–8.5)

## 2017-10-17 DIAGNOSIS — C44629 Squamous cell carcinoma of skin of left upper limb, including shoulder: Secondary | ICD-10-CM | POA: Diagnosis not present

## 2017-10-17 DIAGNOSIS — C44319 Basal cell carcinoma of skin of other parts of face: Secondary | ICD-10-CM | POA: Diagnosis not present

## 2017-10-29 ENCOUNTER — Other Ambulatory Visit (HOSPITAL_COMMUNITY): Payer: Self-pay | Admitting: Emergency Medicine

## 2017-10-29 DIAGNOSIS — C9001 Multiple myeloma in remission: Secondary | ICD-10-CM

## 2017-10-29 MED ORDER — LENALIDOMIDE 10 MG PO CAPS: ORAL_CAPSULE | ORAL | 0 refills | Status: DC

## 2017-11-05 ENCOUNTER — Encounter: Payer: Self-pay | Admitting: Family Medicine

## 2017-11-05 ENCOUNTER — Ambulatory Visit (INDEPENDENT_AMBULATORY_CARE_PROVIDER_SITE_OTHER): Payer: Medicare Other | Admitting: Family Medicine

## 2017-11-05 VITALS — BP 128/80 | Ht 67.0 in | Wt 195.0 lb

## 2017-11-05 DIAGNOSIS — I2510 Atherosclerotic heart disease of native coronary artery without angina pectoris: Secondary | ICD-10-CM

## 2017-11-05 DIAGNOSIS — Z0000 Encounter for general adult medical examination without abnormal findings: Secondary | ICD-10-CM

## 2017-11-05 DIAGNOSIS — R1310 Dysphagia, unspecified: Secondary | ICD-10-CM | POA: Diagnosis not present

## 2017-11-05 DIAGNOSIS — D801 Nonfamilial hypogammaglobulinemia: Secondary | ICD-10-CM | POA: Diagnosis not present

## 2017-11-05 DIAGNOSIS — C9001 Multiple myeloma in remission: Secondary | ICD-10-CM | POA: Diagnosis not present

## 2017-11-05 DIAGNOSIS — M2550 Pain in unspecified joint: Secondary | ICD-10-CM

## 2017-11-05 DIAGNOSIS — I10 Essential (primary) hypertension: Secondary | ICD-10-CM

## 2017-11-05 DIAGNOSIS — Z8572 Personal history of non-Hodgkin lymphomas: Secondary | ICD-10-CM

## 2017-11-05 DIAGNOSIS — Z0001 Encounter for general adult medical examination with abnormal findings: Secondary | ICD-10-CM | POA: Diagnosis not present

## 2017-11-05 MED ORDER — AMLODIPINE BESYLATE 10 MG PO TABS: ORAL_TABLET | ORAL | 1 refills | Status: DC

## 2017-11-05 MED ORDER — DICLOFENAC SODIUM 1 % TD GEL: 2.0000 g | Freq: Four times a day (QID) | TRANSDERMAL | 11 refills | Status: DC | PRN

## 2017-11-05 MED ORDER — OMEPRAZOLE 20 MG PO CPDR: DELAYED_RELEASE_CAPSULE | ORAL | 1 refills | Status: DC

## 2017-11-05 MED ORDER — CITALOPRAM HYDROBROMIDE 40 MG PO TABS: ORAL_TABLET | ORAL | 1 refills | Status: DC

## 2017-11-05 MED ORDER — HYDROCODONE-HOMATROPINE 5-1.5 MG/5ML PO SYRP: ORAL_SOLUTION | ORAL | 0 refills | Status: DC

## 2017-11-05 NOTE — Progress Notes (Signed)
Subjective:    Patient ID: Jeremy Parrish, male    DOB: [birth year], 76 y.o.   MRN: [id number] Patient arrives for wellness plus numerous concerns HPI  Patient is here today to follow up on Htn. He is currently on Norvasc 10 mg one daily. He states he eats more than he should eat,and gets some exercise. Would like voltaren refilled. The patient comes in today for a wellness visit.  Blood pressure medicine and blood pressure levels reviewed today with patient. Compliant with blood pressure medicine. States does not miss a dose. No obvious side effects. Blood pressure generally good when checked elsewhere. Watching salt intake.   Recent cough and cong, worse at night time   A review of their health history was completed.  A review of medications was also completed.  Any needed refills; update all meds  Eating habits: goes to gyn two or three times per day   Falls/  MVA accidents in past few months: none  Regular exercise: none  Specialist pt sees on regular basis: none  Preventative health issues were discussed.   Mini cog pass.    patient notes swallowing difficulty.  Dribbles particularly fluids.  This has gone on since his stroke back in 2013.  Patient noticed getting worse.  Blood pressure medicine and blood pressure levels reviewed today with patient. Compliant with blood pressure medicine. States does not miss a dose. No obvious side effects. Blood pressure generally good when checked elsewhere. Watching salt intake.  Patient also notes arthritis pain Review of Systems  Constitutional: Negative for activity change, appetite change and fever.  HENT: Negative for congestion and rhinorrhea.   Eyes: Negative for discharge.  Respiratory: Negative for cough and wheezing.   Cardiovascular: Negative for chest pain.  Gastrointestinal: Negative for abdominal pain, blood in stool and vomiting.  Genitourinary: Negative for difficulty urinating and frequency.  Musculoskeletal:  Negative for neck pain.  Skin: Negative for rash.  Allergic/Immunologic: Negative for environmental allergies and food allergies.  Neurological: Negative for weakness and headaches.  Psychiatric/Behavioral: Negative for agitation.  All other systems reviewed and are negative.      Objective:   Physical Exam  Constitutional: He appears well-developed and well-nourished.  HENT:  Head: Normocephalic and atraumatic.  Right Ear: External ear normal.  Left Ear: External ear normal.  Nose: Nose normal.  Mouth/Throat: Oropharynx is clear and moist.  Eyes: EOM are normal. Pupils are equal, round, and reactive to light.  Neck: Normal range of motion. Neck supple. No thyromegaly present.  Cardiovascular: Normal rate, regular rhythm and normal heart sounds.  No murmur heard. Pulmonary/Chest: Effort normal and breath sounds normal. No respiratory distress. He has no wheezes.  Abdominal: Soft. Bowel sounds are normal. He exhibits no distension and no mass. There is no tenderness.  Genitourinary: Penis normal.  Musculoskeletal: Normal range of motion. He exhibits no edema.  Lymphadenopathy:    He has no cervical adenopathy.  Neurological: He is alert. He exhibits normal muscle tone.  Skin: Skin is warm and dry. No erythema.  Psychiatric: He has a normal mood and affect. His behavior is normal. Judgment normal.  Vitals reviewed.         Assessment & Plan:  Impression wellness exam.  Diet discussed.  Patient still exercising regularly.  Followed by cancer specialists with multiple myeloma  #2 hypertension good control discussed to maintain same meds  3.  Progressive difficulty swallowing.  Related to stroke.  May also be additional elements with progressive worsening.  Discussed at great length.  Patient would like to press on with intervention.  We will do a swallowing study.  Plus a speech referral rationale discussed.  4.  Chronic arthritis with ongoing pain.  I advised patient to  utilize Tylenol.  I am going to hold off on chronic narcotics on this patient.  5.  Recent nighttime cough.  Generally nonproductive.  Patient requests stronger cough medicine

## 2017-11-07 ENCOUNTER — Ambulatory Visit (HOSPITAL_COMMUNITY)
Admission: RE | Admit: 2017-11-07 | Discharge: 2017-11-07 | Disposition: A | Discharge status: Home / Self Care | Payer: Medicare Other | Source: Ambulatory Visit | Attending: Family Medicine | Admitting: Family Medicine

## 2017-11-07 DIAGNOSIS — R1310 Dysphagia, unspecified: Secondary | ICD-10-CM | POA: Diagnosis not present

## 2017-11-07 DIAGNOSIS — K224 Dyskinesia of esophagus: Secondary | ICD-10-CM | POA: Diagnosis not present

## 2017-11-07 DIAGNOSIS — K449 Diaphragmatic hernia without obstruction or gangrene: Secondary | ICD-10-CM | POA: Diagnosis not present

## 2017-11-09 DIAGNOSIS — J441 Chronic obstructive pulmonary disease with (acute) exacerbation: Secondary | ICD-10-CM | POA: Diagnosis not present

## 2017-11-10 ENCOUNTER — Encounter: Payer: Self-pay | Admitting: Family Medicine

## 2017-11-10 NOTE — Addendum Note (Signed)
Addended by: Karle Barr on: 11/10/2017 09:14 AM   Modules accepted: Orders

## 2017-11-12 ENCOUNTER — Other Ambulatory Visit (HOSPITAL_COMMUNITY): Payer: Self-pay | Admitting: Specialist

## 2017-11-12 DIAGNOSIS — R1319 Other dysphagia: Secondary | ICD-10-CM

## 2017-11-13 ENCOUNTER — Inpatient Hospital Stay (HOSPITAL_COMMUNITY): Payer: Medicare Other | Attending: Oncology

## 2017-11-13 ENCOUNTER — Encounter (HOSPITAL_COMMUNITY): Payer: Self-pay

## 2017-11-13 ENCOUNTER — Inpatient Hospital Stay (HOSPITAL_BASED_OUTPATIENT_CLINIC_OR_DEPARTMENT_OTHER): Payer: Medicare Other

## 2017-11-13 ENCOUNTER — Other Ambulatory Visit: Payer: Self-pay

## 2017-11-13 VITALS — BP 132/73 | HR 59 | Temp 97.9°F | Resp 18 | Wt 198.0 lb

## 2017-11-13 DIAGNOSIS — C9001 Multiple myeloma in remission: Secondary | ICD-10-CM

## 2017-11-13 DIAGNOSIS — D801 Nonfamilial hypogammaglobulinemia: Secondary | ICD-10-CM | POA: Diagnosis present

## 2017-11-13 DIAGNOSIS — E538 Deficiency of other specified B group vitamins: Secondary | ICD-10-CM | POA: Insufficient documentation

## 2017-11-13 LAB — CBC WITH DIFFERENTIAL/PLATELET
Basophils Absolute: 0.1 10*3/uL (ref 0.0–0.1)
Basophils Relative: 1 %
Eosinophils Absolute: 0.2 10*3/uL (ref 0.0–0.7)
Eosinophils Relative: 4 %
HCT: 37.8 % — ABNORMAL LOW (ref 39.0–52.0)
HEMOGLOBIN: 11.9 g/dL — AB (ref 13.0–17.0)
LYMPHS ABS: 3.4 10*3/uL (ref 0.7–4.0)
Lymphocytes Relative: 64 %
MCH: 28.3 pg (ref 26.0–34.0)
MCHC: 31.5 g/dL (ref 30.0–36.0)
MCV: 89.8 fL (ref 78.0–100.0)
MONOS PCT: 6 %
Monocytes Absolute: 0.3 10*3/uL (ref 0.1–1.0)
NEUTROS ABS: 1.4 10*3/uL — AB (ref 1.7–7.7)
NEUTROS PCT: 25 %
Platelets: 98 10*3/uL — ABNORMAL LOW (ref 150–400)
RBC: 4.21 MIL/uL — ABNORMAL LOW (ref 4.22–5.81)
RDW: 16.6 % — ABNORMAL HIGH (ref 11.5–15.5)
WBC: 5.4 10*3/uL (ref 4.0–10.5)

## 2017-11-13 LAB — COMPREHENSIVE METABOLIC PANEL
ALK PHOS: 77 U/L (ref 38–126)
ALT: 17 U/L (ref 17–63)
AST: 24 U/L (ref 15–41)
Albumin: 3.7 g/dL (ref 3.5–5.0)
Anion gap: 12 (ref 5–15)
BUN: 9 mg/dL (ref 6–20)
CALCIUM: 8.9 mg/dL (ref 8.9–10.3)
CO2: 22 mmol/L (ref 22–32)
CREATININE: 0.94 mg/dL (ref 0.61–1.24)
Chloride: 105 mmol/L (ref 101–111)
GFR calc non Af Amer: >60 mL/min (ref >60)
Glucose, Bld: 89 mg/dL (ref 65–99)
Potassium: 3.6 mmol/L (ref 3.5–5.1)
SODIUM: 139 mmol/L (ref 135–145)
Total Bilirubin: 0.3 mg/dL (ref 0.3–1.2)
Total Protein: 7 g/dL (ref 6.5–8.1)

## 2017-11-13 LAB — LACTATE DEHYDROGENASE: LDH: 142 U/L (ref 98–192)

## 2017-11-13 MED ORDER — ACETAMINOPHEN 325 MG PO TABS
650.0000 mg | ORAL_TABLET | Freq: Once | ORAL | Status: AC
  Administered 2017-11-13: 650 mg via ORAL

## 2017-11-13 MED ORDER — SODIUM CHLORIDE 0.9 % IJ SOLN
10.0000 mL | INTRAMUSCULAR | Status: DC | PRN
  Administered 2017-11-13: 10 mL
  Filled 2017-11-13: qty 10

## 2017-11-13 MED ORDER — HEPARIN SOD (PORK) LOCK FLUSH 100 UNIT/ML IV SOLN
500.0000 [IU] | Freq: Once | INTRAVENOUS | Status: AC | PRN
  Administered 2017-11-13: 500 [IU]

## 2017-11-13 MED ORDER — IMMUNE GLOBULIN (HUMAN) 20 GM/200ML IV SOLN
400.0000 mg/kg | Freq: Once | INTRAVENOUS | Status: AC
  Administered 2017-11-13: 35 g via INTRAVENOUS
  Filled 2017-11-13: qty 50

## 2017-11-13 MED ORDER — CYANOCOBALAMIN 1000 MCG/ML IJ SOLN
1000.0000 ug | Freq: Once | INTRAMUSCULAR | Status: AC
  Administered 2017-11-13: 1000 ug via INTRAMUSCULAR

## 2017-11-13 MED ORDER — DEXTROSE 5 % IV SOLN
INTRAVENOUS | Status: DC
  Administered 2017-11-13: 10:00:00 via INTRAVENOUS

## 2017-11-13 NOTE — Progress Notes (Signed)
Jeremy Parrish presents today for injection per MD orders. B12 1,000 mcg administered IM  in right Upper Arm. Administration without incident. Patient tolerated well.

## 2017-11-13 NOTE — Progress Notes (Signed)
Labs obtained through port   Treatment given per orders. Patient tolerated it well without problems. Vitals stable and discharged home from clinic ambulatory. Follow up as scheduled.

## 2017-11-13 NOTE — Patient Instructions (Signed)
[facility] at [facility] Discharge Instructions  RECOMMENDATIONS MADE BY THE CONSULTANT AND ANY TEST RESULTS WILL BE SENT TO YOUR REFERRING PHYSICIAN.  IVIG given today. B12  Injection today. Follow up as scheduled.  Thank you for choosing [place] at [facility] to provide your oncology and hematology care.  To afford each patient quality time with our provider, please arrive at least 15 minutes before your scheduled appointment time.    If you have a lab appointment with the [facility] please come in thru the  Main Entrance and check in at the main information desk  You need to re-schedule your appointment should you arrive 10 or more minutes late.  We strive to give you quality time with our providers, and arriving late affects you and other patients whose appointments are after yours.  Also, if you no show three or more times for appointments you may be dismissed from the clinic at the providers discretion.     Again, thank you for choosing [facility].  Our hope is that these requests will decrease the amount of time that you wait before being seen by our physicians.       _____________________________________________________________  Should you have questions after your visit to [facility], please contact our office at [phone] between the hours of 8:30 a.m. and 4:30 p.m.  Voicemails left after 4:30 p.m. will not be returned until the following business day.  For prescription refill requests, have your pharmacy contact our office.       Resources For Cancer Patients and their Caregivers ? American Cancer Society: Can assist with transportation, wigs, general needs, runs Look Good Feel Better.        856-060-9719 ? Cancer Care: Provides financial assistance, online support groups, medication/co-pay assistance.  1-800-813-HOPE (318)838-5684) ? [facility] Assists [place]  cancer patients and their families through emotional , educational and financial support.  (930) 863-6385 ? [place] DSS Where to apply for food stamps, Medicaid and utility assistance. (352) 091-3431 ? RCATS: Transportation to medical appointments. (843)052-8192 ? Social Security Administration: May apply for disability if have a Stage IV cancer. 315-619-2852 805-800-1369 ? [employer], Disability and Transit Services: Assists with nutrition, care and transit needs. [place] Support Programs: @10RELATIVEDAYS @ > Cancer Support Group  2nd Tuesday of the month 1pm-2pm, Journey Room  > Creative Journey  3rd Tuesday of the month 1130am-1pm, Journey Room  > Look Good Feel Better  1st Wednesday of the month 10am-12 noon, Journey Room (Call [place] to register [phone])

## 2017-11-14 ENCOUNTER — Other Ambulatory Visit (HOSPITAL_COMMUNITY): Payer: Self-pay | Admitting: Oncology

## 2017-11-14 DIAGNOSIS — C9001 Multiple myeloma in remission: Secondary | ICD-10-CM

## 2017-11-14 LAB — IMMUNOFIXATION ELECTROPHORESIS
IGM (IMMUNOGLOBULIN M), SRM: 30 mg/dL (ref 15–143)
IgA: 416 mg/dL (ref 61–437)
IgG (Immunoglobin G), Serum: 964 mg/dL (ref 700–1600)
Total Protein ELP: 6.6 g/dL (ref 6.0–8.5)

## 2017-11-14 LAB — KAPPA/LAMBDA LIGHT CHAINS
Kappa free light chain: 39 mg/L — ABNORMAL HIGH (ref 3.3–19.4)
Kappa, lambda light chain ratio: 1.53 (ref 0.26–1.65)
Lambda free light chains: 25.5 mg/L (ref 5.7–26.3)

## 2017-11-14 LAB — PROTEIN ELECTROPHORESIS, SERUM
A/G Ratio: 1 (ref 0.7–1.7)
ALBUMIN ELP: 3.3 g/dL (ref 2.9–4.4)
ALPHA-1-GLOBULIN: 0.2 g/dL (ref 0.0–0.4)
Alpha-2-Globulin: 0.6 g/dL (ref 0.4–1.0)
Beta Globulin: 1 g/dL (ref 0.7–1.3)
GAMMA GLOBULIN: 1.5 g/dL (ref 0.4–1.8)
Globulin, Total: 3.3 g/dL (ref 2.2–3.9)
M-Spike, %: 0.4 g/dL — ABNORMAL HIGH
Total Protein ELP: 6.6 g/dL (ref 6.0–8.5)

## 2017-11-14 LAB — IGG, IGA, IGM
IGA: 400 mg/dL (ref 61–437)
IGM (IMMUNOGLOBULIN M), SRM: 29 mg/dL (ref 15–143)
IgG (Immunoglobin G), Serum: 1005 mg/dL (ref 700–1600)

## 2017-11-14 MED ORDER — LENALIDOMIDE 10 MG PO CAPS: ORAL_CAPSULE | ORAL | 0 refills | Status: DC

## 2017-11-17 ENCOUNTER — Ambulatory Visit (INDEPENDENT_AMBULATORY_CARE_PROVIDER_SITE_OTHER): Payer: Medicare Other | Admitting: Family Medicine

## 2017-11-17 ENCOUNTER — Encounter: Payer: Self-pay | Admitting: Family Medicine

## 2017-11-17 VITALS — BP 110/74 | Temp 98.1°F | Wt 195.0 lb

## 2017-11-17 DIAGNOSIS — R059 Cough, unspecified: Secondary | ICD-10-CM

## 2017-11-17 DIAGNOSIS — J019 Acute sinusitis, unspecified: Secondary | ICD-10-CM | POA: Diagnosis not present

## 2017-11-17 DIAGNOSIS — R05 Cough: Secondary | ICD-10-CM

## 2017-11-17 DIAGNOSIS — R0602 Shortness of breath: Secondary | ICD-10-CM | POA: Diagnosis not present

## 2017-11-17 DIAGNOSIS — J209 Acute bronchitis, unspecified: Secondary | ICD-10-CM

## 2017-11-17 MED ORDER — PREDNISONE 20 MG PO TABS: ORAL_TABLET | ORAL | 0 refills | Status: DC

## 2017-11-17 MED ORDER — ALBUTEROL SULFATE HFA 108 (90 BASE) MCG/ACT IN AERS: 2.0000 | INHALATION_SPRAY | Freq: Four times a day (QID) | RESPIRATORY_TRACT | 3 refills | Status: AC | PRN

## 2017-11-17 MED ORDER — ALBUTEROL SULFATE (2.5 MG/3ML) 0.083% IN NEBU: INHALATION_SOLUTION | RESPIRATORY_TRACT | 2 refills | Status: DC

## 2017-11-17 MED ORDER — AMOXICILLIN-POT CLAVULANATE 875-125 MG PO TABS: ORAL_TABLET | ORAL | 0 refills | Status: DC

## 2017-11-17 MED ORDER — CEFTRIAXONE SODIUM 1 G IJ SOLR
500.0000 mg | Freq: Once | INTRAMUSCULAR | Status: AC
  Administered 2017-11-17: 500 mg via INTRAMUSCULAR

## 2017-11-17 NOTE — Progress Notes (Signed)
   Subjective:    Patient ID: Jeremy Parrish, male    DOB: 06/06/1941, 77 y.o.   MRN: [id number]  Sinusitis  This is a new problem. Episode onset: 4 -5 weeks. (Sob, cough, congestion, diarrhea, headache, light headed) Treatments tried: inhaler.   Needs refill on ventolin inhaler and meds for neb machine.  Cough and congestion, now coughing up green stuff, some headache bi temporal    No fever or chills.  Positive known COPD  Review of Systems No vomiting or    Objective:   Physical Exam Alert active mild malaise HEENT some congestion pharynx normal lungs rare wheeze with deep breath intermittent bronchial cough no inspiratory crackles       Assessment & Plan:  Impression subacute rhinosinusitis/bronchitis with reactive airways patient requests injection will give Rocephin.  Plus symptom care discussed albuterol use to continue

## 2017-11-19 ENCOUNTER — Ambulatory Visit (HOSPITAL_COMMUNITY)
Admission: RE | Admit: 2017-11-19 | Discharge: 2017-11-19 | Disposition: A | Discharge status: Home / Self Care | Payer: Medicare Other | Source: Ambulatory Visit | Attending: Family Medicine | Admitting: Family Medicine

## 2017-11-19 ENCOUNTER — Ambulatory Visit (HOSPITAL_COMMUNITY): Payer: Medicare Other | Attending: Family Medicine | Admitting: Speech Pathology

## 2017-11-19 DIAGNOSIS — I480 Paroxysmal atrial fibrillation: Secondary | ICD-10-CM | POA: Diagnosis not present

## 2017-11-19 DIAGNOSIS — Z6831 Body mass index (BMI) 31.0-31.9, adult: Secondary | ICD-10-CM | POA: Diagnosis not present

## 2017-11-19 DIAGNOSIS — R7303 Prediabetes: Secondary | ICD-10-CM | POA: Diagnosis not present

## 2017-11-19 DIAGNOSIS — E785 Hyperlipidemia, unspecified: Secondary | ICD-10-CM | POA: Insufficient documentation

## 2017-11-19 DIAGNOSIS — F329 Major depressive disorder, single episode, unspecified: Secondary | ICD-10-CM | POA: Diagnosis not present

## 2017-11-19 DIAGNOSIS — T17308A Unspecified foreign body in larynx causing other injury, initial encounter: Secondary | ICD-10-CM | POA: Diagnosis not present

## 2017-11-19 DIAGNOSIS — I2510 Atherosclerotic heart disease of native coronary artery without angina pectoris: Secondary | ICD-10-CM | POA: Insufficient documentation

## 2017-11-19 DIAGNOSIS — J449 Chronic obstructive pulmonary disease, unspecified: Secondary | ICD-10-CM | POA: Insufficient documentation

## 2017-11-19 DIAGNOSIS — I110 Hypertensive heart disease with heart failure: Secondary | ICD-10-CM | POA: Insufficient documentation

## 2017-11-19 DIAGNOSIS — I509 Heart failure, unspecified: Secondary | ICD-10-CM | POA: Insufficient documentation

## 2017-11-19 DIAGNOSIS — Z950 Presence of cardiac pacemaker: Secondary | ICD-10-CM | POA: Diagnosis not present

## 2017-11-19 DIAGNOSIS — R1310 Dysphagia, unspecified: Secondary | ICD-10-CM | POA: Diagnosis not present

## 2017-11-19 DIAGNOSIS — Z8673 Personal history of transient ischemic attack (TIA), and cerebral infarction without residual deficits: Secondary | ICD-10-CM | POA: Insufficient documentation

## 2017-11-19 DIAGNOSIS — R1319 Other dysphagia: Secondary | ICD-10-CM

## 2017-11-19 DIAGNOSIS — I252 Old myocardial infarction: Secondary | ICD-10-CM | POA: Insufficient documentation

## 2017-11-19 DIAGNOSIS — F411 Generalized anxiety disorder: Secondary | ICD-10-CM | POA: Insufficient documentation

## 2017-11-19 DIAGNOSIS — I10 Essential (primary) hypertension: Secondary | ICD-10-CM | POA: Diagnosis not present

## 2017-11-19 DIAGNOSIS — R1312 Dysphagia, oropharyngeal phase: Secondary | ICD-10-CM | POA: Insufficient documentation

## 2017-11-19 DIAGNOSIS — Z7901 Long term (current) use of anticoagulants: Secondary | ICD-10-CM | POA: Diagnosis not present

## 2017-11-19 DIAGNOSIS — G4733 Obstructive sleep apnea (adult) (pediatric): Secondary | ICD-10-CM | POA: Diagnosis not present

## 2017-11-19 DIAGNOSIS — K219 Gastro-esophageal reflux disease without esophagitis: Secondary | ICD-10-CM | POA: Insufficient documentation

## 2017-11-19 DIAGNOSIS — E538 Deficiency of other specified B group vitamins: Secondary | ICD-10-CM | POA: Insufficient documentation

## 2017-11-19 DIAGNOSIS — I739 Peripheral vascular disease, unspecified: Secondary | ICD-10-CM | POA: Diagnosis not present

## 2017-11-19 DIAGNOSIS — E782 Mixed hyperlipidemia: Secondary | ICD-10-CM | POA: Diagnosis not present

## 2017-11-20 ENCOUNTER — Other Ambulatory Visit: Payer: Self-pay

## 2017-11-20 ENCOUNTER — Encounter (HOSPITAL_COMMUNITY): Payer: Self-pay | Admitting: Speech Pathology

## 2017-11-20 NOTE — Therapy (Signed)
[place], [place], [address] Phone: [phone]   Fax:  [phone]  Modified Barium Swallow  Patient Details  Name: Jeremy Parrish MRN: [id number] Date of Birth: 1941-01-31 No Data Recorded  Encounter Date: 11/19/2017  End of Session - 11/19/17 1024    Visit Number  1    Number of Visits  1    Authorization Type  Medicare    SLP Start Time  6734    SLP Stop Time   1420    SLP Time Calculation (min)  36 min    Activity Tolerance  Patient tolerated treatment well       Past Medical History:  Diagnosis Date  . Anemia   . Aortic aneurysm of unspecified site without mention of rupture   . Arthritis   . Bladder neck contracture   . Cancer ([place])   . Cerebral atherosclerosis    Carotid Doppler, 02/16/2013 - Bilateral Proximal ICAs,demonstrate mild plaque w/o evidence of significant diameter reduction, dissection, or any other vascular abnormality  . CHF (congestive heart failure) ([place])   . Complication of anesthesia   . COPD (chronic obstructive pulmonary disease) ([place])   . Coronary artery disease   . Depression   . Esophageal reflux   . Heart disease   . Heart murmur   . Hx of bladder cancer 10/07/2012  . Hyperlipidemia   . Hypertension   . 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 Intestinovesical fistula   . Kidney stones    history  . Lung mass   . Multiple myeloma   . Myocardial infarction [facility])    '96  . Non Hodgkin's lymphoma ([place])   . Paroxysmal atrial fibrillation ([place]) 01/02/2016  . Peripheral arterial disease ([place])   . Personal history of other diseases of circulatory system   . PONV (postoperative nausea and vomiting)   . Prostate cancer ([place]) 2000  . Shingles   . Shortness of breath   . Sleep apnea    05-02-14 cpap , not yet used- suggested settings 5  . Stroke [facility]) 2013   Speech.    Past Surgical History:  Procedure  Laterality Date  . BLADDER SURGERY    . BONE MARROW TRANSPLANT  2011  . COLON SURGERY     colon resection  . COLONOSCOPY N/A 01/01/2013   Procedure: COLONOSCOPY;  Surgeon: Rogene Houston, MD;  Location: [facility];  Service: Endoscopy;  Laterality: N/A;  825-moved to [place] notified Jeremy Parrish  . CORONARY ANGIOPLASTY  06/24/2000   PCI and stenting in mid & proximal RCA  . heart stents x 5  1999  . INGUINAL HERNIA REPAIR Right 05/04/2014   Procedure: OPEN RIGHT INGUINAL HERNIA REPAIR with mesh;  Surgeon: Edward Jolly, MD;  Location: [facility] ORS;  Service: General;  Laterality: Right;  . INSERT / REPLACE / REMOVE PACEMAKER    . left ear skin cancer removed    . NM MYOCAR PERF WALL MOTION  11/27/2007   inferior scar  . PACEMAKER INSERTION  07/22/2011   Medtronic  . PORTACATH PLACEMENT  07/26/2009   right chest  . PROSTATE SURGERY    . Rotator    . ROTATOR CUFF REPAIR Right   . SHOULDER ARTHROSCOPY WITH SUBACROMIAL DECOMPRESSION Right 07/21/2013   Procedure: RIGHT SHOULDER ARTHROSCOPY WITH SUBACROMIAL DECOMPRESSION AND DEBRIDEMENT & Injection of Left Shoulder;  Surgeon: Alta Corning, MD;  Location:  [facility] OR;  Service: Orthopedics;  Laterality: Right;  . TEE WITHOUT CARDIOVERSION  10/13/2012   Procedure: TRANSESOPHAGEAL ECHOCARDIOGRAM (TEE);  Surgeon: Sanda Klein, MD;  Location: [facility] ENDOSCOPY;  Service: Cardiovascular;  Laterality: N/A;  pat/kay/echo notified  . US ECHOCARDIOGRAPHY  06/19/2011   RV mildly dilated,mild to mod. MR,mild AI,mild PI  . WRIST SURGERY     right    There were no vitals filed for this visit.  Subjective Assessment - 11/20/17 1000    Subjective  "I am doing better now, but I was getting choked on pieces of corn."    Special Tests  MBSS    Currently in Pain?  No/denies          General - 11/20/17 1003      General Information   Date of Onset  11/07/17    HPI  [name] is a 77 yo male who was referred for MBSS by Dr. Grace Bushy. Luking due to observed  penetration during barium swallow on 11/06/2017. Jeremy Parrish reported difficulty swallowing small pieces of food (corn). Jeremy Parrish tells SLP that it has improved in the last month since he has started slowing down and taking his time. 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     Type of Study  MBS-Modified Barium Swallow Study    Previous Swallow Assessment  Barium Swallow 11/06/2017: Laryngeal penetration with contrast extending onto the vocal cords. No spontaneous cough reflex identified. Contrast was cleared by instructed coughing. Mild residuals within the vallecula and piriform sinuses.    Diet Prior to this Study  Regular;Thin liquids    Temperature Spikes Noted  No    Respiratory Status  Room air    History of Recent Intubation  No    Behavior/Cognition  Alert;Cooperative;Pleasant mood    Oral Cavity Assessment  Within Functional Limits    Oral Care Completed by SLP  No    Oral Cavity - Dentition  Adequate natural dentition    Vision  Functional for self feeding    Self-Feeding Abilities  Able to feed self    Patient Positioning  Upright in chair    Baseline Vocal Quality  Normal    Volitional Cough  Strong    Volitional Swallow  Able to elicit    Anatomy  Within functional limits    Pharyngeal Secretions  Not observed secondary MBS       Adult Oral Care Protocol - 11/20/17 1020      Oral Assessment (Complete on admission/transfer/change in patient condition)   Does patient have any of the following "high risk" factors?  None of the above    Does patient have any of the following "at risk" factors?  None of the above       Oral Preparation/Oral Phase - 11/20/17 1021      Oral Preparation/Oral Phase   Oral Phase  Impaired      Oral - Solids   Oral - Regular  Decreased bolus cohesion      Electrical stimulation - Oral Phase   Was Electrical Stimulation Used  No       Pharyngeal Phase - 11/20/17  1021      Pharyngeal Phase   Pharyngeal Phase  Impaired      Pharyngeal - Thin   Pharyngeal- Thin Cup  Swallow initiation at vallecula;Swallow initiation at pyriform sinus;Delayed swallow initiation;Pharyngeal residue - valleculae;Pharyngeal residue - pyriform;Compensatory strategies  attempted (with notebox) mild residuals post swallow cleared with cued dry swallow    Pharyngeal- Thin Straw  Swallow initiation at pyriform sinus;Pharyngeal residue - valleculae;Pharyngeal residue - pyriform      Pharyngeal - Solids   Pharyngeal- Puree  Within functional limits;Swallow initiation at vallecula    Pharyngeal- Regular  Within functional limits;Delayed swallow initiation-vallecula starts to spill to pyriforms    Pharyngeal- Pill  Within functional limits      Electrical Stimulation - Pharyngeal Phase   Was Electrical Stimulation Used  No       Cricopharyngeal Phase - 11/20/17 1023      Cervical Esophageal Phase   Cervical Esophageal Phase  Within functional limits       Plan - 11/20/17 1024    Clinical Impression Statement  Jeremy Parrish presents with mild oropharyngeal phase dysphagia characterized by reduced bolus cohesiveness orally with solids resulting in small amounts of bolus spilling to valleculae and lateral channels prior to swallow trigger; swallow trigger is generally at the level of the valleculae across consistencies/textures. Jeremy Parrish had a single episode of flash laryngeal penetration with thins during the swallow when taking barium tablet with straw sips of thin barium. Penetration immediately removed and no aspiration observed. Jeremy Parrish with mild vallecular and pyriform residuals post swallow with liquids and he required verbal cues to repeat/dry swallow to clear (effective). The barium tablet was briefly delayed near GE junction and passed into stomach after Jeremy Parrish presented with a bite of applesauce and liquid wash to clear. No significant backup of bolus/barium noted in esophagus during esophageal  sweep.   Jeremy Parrish subjectively reported that his swallowing "was good today" and he can sense immediately when he is going to have trouble with something. He states that he thinks it is when he is excited, eating quickly, and talking when eating. The MBSS imaging was reviewed with Jeremy Parrish and written strategies were provided which included: swallow two times for every one sip of liquid, take small bites/sips, chew thoroughly, and avoid conversation during meals. Jeremy Parrish appreciative of recommendations and advised to notify MD/SLP if he continues to have trouble. No further SLP services at this time.     Consulted and Agree with Plan of Care  Patient       Patient will benefit from skilled therapeutic intervention in order to improve the following deficits and impairments:   Dysphagia, oropharyngeal phase    Recommendations/Treatment - 11/20/17 1023      Swallow Evaluation Recommendations   SLP Diet Recommendations  Age appropriate regular;Thin    Liquid Administration via  Cup;Straw    Medication Administration  Whole meds with liquid    Supervision  Patient able to self feed    Compensations  Slow rate;Small sips/bites;Multiple dry swallows after each bite/sip    Postural Changes  Seated upright at 90 degrees;Remain upright for at least 30 minutes after feeds/meals       Prognosis - 11/20/17 1023      Prognosis   Prognosis for Safe Diet Advancement  Good      Individuals Consulted   Consulted and Agree with Results and Recommendations  Patient    Report Sent to   Referring physician       Problem List Patient Active Problem List   Diagnosis Date Noted  . Vitamin B12 deficiency   . Long term current use of anticoagulant 12/25/2016  . COPD (chronic obstructive pulmonary disease) ([place]) 04/18/2016  . Paroxysmal atrial fibrillation ([place]) 01/02/2016  . Hypokalemia 11/03/2014  . URI (  upper respiratory infection) 11/03/2014  . Weakness 11/03/2014  . Obstructive sleep apnea 08/07/2014  .  Central sleep apnea 08/07/2014  . Inguinal hernia 05/04/2014  . Chest pain with moderate risk for cardiac etiology 11/25/2013  . Fever 09/13/2013  . Pneumonia 09/13/2013  . Pancytopenia ([place]) 09/13/2013  . Muscle weakness (generalized) 08/04/2013  . Status post arthroscopy of shoulder 08/04/2013  . Pain in joint, shoulder region 08/04/2013  . Rotator cuff tear arthropathy of right shoulder 07/21/2013  . Left rotator cuff tear arthropathy 07/21/2013  . Shortness of breath 07/07/2013  . 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 Hyperlipidemia with target LDL less than 100 10/08/2012  . Prediabetes 10/08/2012  . Expressive aphasia 10/08/2012  . Hemiplegia affecting right dominant side ([place]) 10/08/2012  . 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 Hx of bladder cancer 10/07/2012  . 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 ASTHMA, UNSPECIFIED 03/22/2010  . Nonspecific (abnormal) findings on radiological and other examination of body structure 03/22/2010  . Hx of lymphoma 03/21/2010  . Multiple myeloma in remission ([place]) 03/21/2010  . Anxiety state 03/21/2010  . Essential hypertension 03/21/2010  . MYOCARDIAL INFARCTION 03/21/2010  . Coronary atherosclerosis 03/21/2010  . NEPHROLITHIASIS 03/21/2010  . ELEVATED PROSTATE SPECIFIC ANTIGEN 03/21/2010  . ROTATOR CUFF REPAIR, RIGHT, HX OF 03/21/2010   Thank you,  Genene Churn, [place]  [facility] 11/19/2017, 10:34 PM  [place] [address] [place], [place], [address] Phone: [phone]   Fax:  [phone]  Name: ABE SCHOOLS MRN: [id number] Date of Birth: 03/03/1941

## 2017-12-02 ENCOUNTER — Other Ambulatory Visit (HOSPITAL_COMMUNITY): Payer: Self-pay | Admitting: Emergency Medicine

## 2017-12-02 DIAGNOSIS — C44629 Squamous cell carcinoma of skin of left upper limb, including shoulder: Secondary | ICD-10-CM | POA: Diagnosis not present

## 2017-12-02 DIAGNOSIS — C9001 Multiple myeloma in remission: Secondary | ICD-10-CM

## 2017-12-02 DIAGNOSIS — C44319 Basal cell carcinoma of skin of other parts of face: Secondary | ICD-10-CM | POA: Diagnosis not present

## 2017-12-02 MED ORDER — LENALIDOMIDE 10 MG PO CAPS: ORAL_CAPSULE | ORAL | 0 refills | Status: DC

## 2017-12-02 NOTE — Progress Notes (Signed)
revlimid refilled

## 2017-12-04 ENCOUNTER — Ambulatory Visit (HOSPITAL_COMMUNITY)
Admission: RE | Admit: 2017-12-04 | Discharge: 2017-12-04 | Disposition: A | Discharge status: Home / Self Care | Payer: Medicare Other | Source: Ambulatory Visit | Attending: Internal Medicine | Admitting: Internal Medicine

## 2017-12-04 ENCOUNTER — Other Ambulatory Visit: Payer: Self-pay | Admitting: Internal Medicine

## 2017-12-04 DIAGNOSIS — J984 Other disorders of lung: Secondary | ICD-10-CM | POA: Diagnosis not present

## 2017-12-04 DIAGNOSIS — R1010 Upper abdominal pain, unspecified: Secondary | ICD-10-CM

## 2017-12-04 DIAGNOSIS — W19XXXA Unspecified fall, initial encounter: Secondary | ICD-10-CM

## 2017-12-04 DIAGNOSIS — R1012 Left upper quadrant pain: Secondary | ICD-10-CM | POA: Insufficient documentation

## 2017-12-04 DIAGNOSIS — R52 Pain, unspecified: Secondary | ICD-10-CM | POA: Diagnosis not present

## 2017-12-04 DIAGNOSIS — R0602 Shortness of breath: Secondary | ICD-10-CM | POA: Insufficient documentation

## 2017-12-04 DIAGNOSIS — R161 Splenomegaly, not elsewhere classified: Secondary | ICD-10-CM | POA: Diagnosis not present

## 2017-12-04 DIAGNOSIS — R1030 Lower abdominal pain, unspecified: Secondary | ICD-10-CM | POA: Diagnosis not present

## 2017-12-04 DIAGNOSIS — I517 Cardiomegaly: Secondary | ICD-10-CM | POA: Diagnosis not present

## 2017-12-04 DIAGNOSIS — R935 Abnormal findings on diagnostic imaging of other abdominal regions, including retroperitoneum: Secondary | ICD-10-CM | POA: Diagnosis not present

## 2017-12-04 DIAGNOSIS — R079 Chest pain, unspecified: Secondary | ICD-10-CM | POA: Diagnosis not present

## 2017-12-04 DIAGNOSIS — K760 Fatty (change of) liver, not elsewhere classified: Secondary | ICD-10-CM | POA: Diagnosis not present

## 2017-12-05 ENCOUNTER — Other Ambulatory Visit (HOSPITAL_COMMUNITY): Payer: Self-pay | Admitting: Internal Medicine

## 2017-12-05 ENCOUNTER — Ambulatory Visit (HOSPITAL_COMMUNITY)
Admission: RE | Admit: 2017-12-05 | Discharge: 2017-12-05 | Disposition: A | Discharge status: Home / Self Care | Payer: Medicare Other | Source: Ambulatory Visit | Attending: Internal Medicine | Admitting: Internal Medicine

## 2017-12-05 DIAGNOSIS — R935 Abnormal findings on diagnostic imaging of other abdominal regions, including retroperitoneum: Secondary | ICD-10-CM

## 2017-12-05 DIAGNOSIS — R1012 Left upper quadrant pain: Secondary | ICD-10-CM

## 2017-12-05 DIAGNOSIS — R918 Other nonspecific abnormal finding of lung field: Secondary | ICD-10-CM | POA: Insufficient documentation

## 2017-12-05 DIAGNOSIS — R161 Splenomegaly, not elsewhere classified: Secondary | ICD-10-CM

## 2017-12-05 DIAGNOSIS — M48061 Spinal stenosis, lumbar region without neurogenic claudication: Secondary | ICD-10-CM | POA: Insufficient documentation

## 2017-12-05 DIAGNOSIS — J90 Pleural effusion, not elsewhere classified: Secondary | ICD-10-CM | POA: Diagnosis not present

## 2017-12-05 DIAGNOSIS — I700 Atherosclerosis of aorta: Secondary | ICD-10-CM | POA: Diagnosis not present

## 2017-12-05 LAB — POCT I-STAT CREATININE: CREATININE: 0.7 mg/dL (ref 0.61–1.24)

## 2017-12-05 MED ORDER — IOPAMIDOL (ISOVUE-300) INJECTION 61%
100.0000 mL | Freq: Once | INTRAVENOUS | Status: AC | PRN
  Administered 2017-12-05: 100 mL via INTRAVENOUS

## 2017-12-06 ENCOUNTER — Other Ambulatory Visit: Payer: Self-pay | Admitting: Cardiology

## 2017-12-06 DIAGNOSIS — E785 Hyperlipidemia, unspecified: Secondary | ICD-10-CM

## 2017-12-09 DIAGNOSIS — J90 Pleural effusion, not elsewhere classified: Secondary | ICD-10-CM | POA: Diagnosis not present

## 2017-12-09 DIAGNOSIS — R05 Cough: Secondary | ICD-10-CM | POA: Diagnosis not present

## 2017-12-12 DIAGNOSIS — R0782 Intercostal pain: Secondary | ICD-10-CM | POA: Diagnosis not present

## 2017-12-15 ENCOUNTER — Encounter (HOSPITAL_COMMUNITY): Payer: Self-pay

## 2017-12-15 ENCOUNTER — Inpatient Hospital Stay (HOSPITAL_COMMUNITY): Payer: Medicare Other | Attending: Oncology

## 2017-12-15 ENCOUNTER — Other Ambulatory Visit (HOSPITAL_COMMUNITY): Payer: Medicare Other

## 2017-12-15 ENCOUNTER — Inpatient Hospital Stay (HOSPITAL_COMMUNITY): Payer: Medicare Other

## 2017-12-15 ENCOUNTER — Other Ambulatory Visit (HOSPITAL_COMMUNITY)
Admission: RE | Admit: 2017-12-15 | Discharge: 2017-12-15 | Disposition: A | Discharge status: Home / Self Care | Payer: Medicare Other | Source: Ambulatory Visit | Attending: Internal Medicine | Admitting: Internal Medicine

## 2017-12-15 ENCOUNTER — Ambulatory Visit (HOSPITAL_COMMUNITY): Payer: Medicare Other

## 2017-12-15 VITALS — BP 125/61 | HR 68 | Temp 97.8°F | Resp 18 | Wt 188.8 lb

## 2017-12-15 DIAGNOSIS — C9001 Multiple myeloma in remission: Secondary | ICD-10-CM

## 2017-12-15 DIAGNOSIS — E782 Mixed hyperlipidemia: Secondary | ICD-10-CM | POA: Diagnosis present

## 2017-12-15 DIAGNOSIS — I10 Essential (primary) hypertension: Secondary | ICD-10-CM | POA: Insufficient documentation

## 2017-12-15 DIAGNOSIS — Z6831 Body mass index (BMI) 31.0-31.9, adult: Secondary | ICD-10-CM | POA: Insufficient documentation

## 2017-12-15 DIAGNOSIS — C9000 Multiple myeloma not having achieved remission: Secondary | ICD-10-CM | POA: Insufficient documentation

## 2017-12-15 DIAGNOSIS — D801 Nonfamilial hypogammaglobulinemia: Secondary | ICD-10-CM | POA: Insufficient documentation

## 2017-12-15 DIAGNOSIS — E538 Deficiency of other specified B group vitamins: Secondary | ICD-10-CM

## 2017-12-15 LAB — COMPREHENSIVE METABOLIC PANEL
ALBUMIN: 3.5 g/dL (ref 3.5–5.0)
ALT: 20 U/L (ref 17–63)
ANION GAP: 11 (ref 5–15)
AST: 27 U/L (ref 15–41)
Alkaline Phosphatase: 90 U/L (ref 38–126)
BILIRUBIN TOTAL: 0.6 mg/dL (ref 0.3–1.2)
BUN: 15 mg/dL (ref 6–20)
CHLORIDE: 102 mmol/L (ref 101–111)
CO2: 24 mmol/L (ref 22–32)
Calcium: 9 mg/dL (ref 8.9–10.3)
Creatinine, Ser: 1.15 mg/dL (ref 0.61–1.24)
GFR calc Af Amer: >60 mL/min (ref >60)
GFR calc non Af Amer: >60 mL/min (ref >60)
Glucose, Bld: 107 mg/dL — ABNORMAL HIGH (ref 65–99)
POTASSIUM: 4.4 mmol/L (ref 3.5–5.1)
Sodium: 137 mmol/L (ref 135–145)
Total Protein: 7.1 g/dL (ref 6.5–8.1)

## 2017-12-15 LAB — CBC WITH DIFFERENTIAL/PLATELET
BASOS ABS: 0 10*3/uL (ref 0.0–0.1)
Basophils Relative: 0 %
EOS PCT: 2 %
Eosinophils Absolute: 0.1 10*3/uL (ref 0.0–0.7)
HEMATOCRIT: 41.5 % (ref 39.0–52.0)
Hemoglobin: 12.6 g/dL — ABNORMAL LOW (ref 13.0–17.0)
Lymphocytes Relative: 64 %
Lymphs Abs: 5.3 10*3/uL — ABNORMAL HIGH (ref 0.7–4.0)
MCH: 27.2 pg (ref 26.0–34.0)
MCHC: 30.4 g/dL (ref 30.0–36.0)
MCV: 89.6 fL (ref 78.0–100.0)
MONO ABS: 0.7 10*3/uL (ref 0.1–1.0)
MONOS PCT: 9 %
NEUTROS ABS: 2.1 10*3/uL (ref 1.7–7.7)
Neutrophils Relative %: 25 %
PLATELETS: 136 10*3/uL — AB (ref 150–400)
RBC: 4.63 MIL/uL (ref 4.22–5.81)
RDW: 16.3 % — AB (ref 11.5–15.5)
WBC: 8.3 10*3/uL (ref 4.0–10.5)

## 2017-12-15 LAB — LIPID PANEL
CHOLESTEROL: 108 mg/dL (ref 0–200)
HDL: 27 mg/dL — AB (ref >40)
LDL Cholesterol: 35 mg/dL (ref 0–99)
TRIGLYCERIDES: 231 mg/dL — AB (ref <150)
Total CHOL/HDL Ratio: 4 RATIO
VLDL: 46 mg/dL — ABNORMAL HIGH (ref 0–40)

## 2017-12-15 LAB — HEMOGLOBIN A1C
HEMOGLOBIN A1C: 5.3 % (ref 4.8–5.6)
Mean Plasma Glucose: 105.41 mg/dL

## 2017-12-15 MED ORDER — IMMUNE GLOBULIN (HUMAN) 10 GM/100ML IV SOLN
400.0000 mg/kg | Freq: Once | INTRAVENOUS | Status: AC
  Administered 2017-12-15: 35 g via INTRAVENOUS
  Filled 2017-12-15: qty 100

## 2017-12-15 MED ORDER — ACETAMINOPHEN 325 MG PO TABS
650.0000 mg | ORAL_TABLET | Freq: Once | ORAL | Status: AC
  Administered 2017-12-15: 650 mg via ORAL

## 2017-12-15 MED ORDER — SODIUM CHLORIDE 0.9 % IJ SOLN
10.0000 mL | INTRAMUSCULAR | Status: DC | PRN
  Administered 2017-12-15: 10 mL
  Filled 2017-12-15: qty 10

## 2017-12-15 MED ORDER — ACETAMINOPHEN 325 MG PO TABS
ORAL_TABLET | ORAL | Status: AC
  Filled 2017-12-15: qty 2

## 2017-12-15 MED ORDER — HEPARIN SOD (PORK) LOCK FLUSH 100 UNIT/ML IV SOLN
500.0000 [IU] | Freq: Once | INTRAVENOUS | Status: AC | PRN
  Administered 2017-12-15: 500 [IU]
  Filled 2017-12-15: qty 5

## 2017-12-15 MED ORDER — DEXTROSE 5 % IV SOLN
INTRAVENOUS | Status: DC
  Administered 2017-12-15: 09:00:00 via INTRAVENOUS

## 2017-12-15 MED ORDER — CYANOCOBALAMIN 1000 MCG/ML IJ SOLN
1000.0000 ug | Freq: Once | INTRAMUSCULAR | Status: AC
  Administered 2017-12-15: 1000 ug via INTRAMUSCULAR
  Filled 2017-12-15: qty 1

## 2017-12-15 MED ORDER — CYANOCOBALAMIN 1000 MCG/ML IJ SOLN
INTRAMUSCULAR | Status: AC
  Filled 2017-12-15: qty 1

## 2017-12-15 NOTE — Progress Notes (Signed)
To treatment area for IVIG.  Patient stated fall last Tuesday.  Went to the emergency room for xrays with no broken ribs.  Complains of tenderness, soreness, and dull throbbing.  Using pain medications at home for relief.  Patient stated he has a good appetite and has been trying to lose weight.    Patient tolerated therapy with no complaints voiced.  Port site clean and dry with no bruising or swelling noted at site.  Band aid applied.  VSS with discharge and left ambulatory with no s/s of distress noted.

## 2017-12-15 NOTE — Patient Instructions (Signed)
[place] at [facility]  Discharge Instructions:  You received  IVIG today.  Call for any questions or concerns.  _______________________________________________________________  Thank you for choosing [place] at [facility] to provide your oncology and hematology care.  To afford each patient quality time with our providers, please arrive at least 15 minutes before your scheduled appointment.  You need to re-schedule your appointment if you arrive 10 or more minutes late.  We strive to give you quality time with our providers, and arriving late affects you and other patients whose appointments are after yours.  Also, if you no show three or more times for appointments you may be dismissed from the clinic.  Again, thank you for choosing [place] [facility] hope is that these requests will allow you access to exceptional care and in a timely manner. _______________________________________________________________  If you have questions after your visit, please contact our office at [phone] between the hours of 8:30 a.m. and 5:00 p.m. Voicemails left after 4:30 p.m. will not be returned until the following business day. _______________________________________________________________  For prescription refill requests, have your pharmacy contact our office. _______________________________________________________________  Recommendations made by the consultant and any test results will be sent to your referring physician. _______________________________________________________________

## 2017-12-30 ENCOUNTER — Other Ambulatory Visit (HOSPITAL_COMMUNITY): Payer: Self-pay | Admitting: Oncology

## 2017-12-30 DIAGNOSIS — R0602 Shortness of breath: Secondary | ICD-10-CM | POA: Diagnosis not present

## 2017-12-30 DIAGNOSIS — Z6829 Body mass index (BMI) 29.0-29.9, adult: Secondary | ICD-10-CM | POA: Diagnosis not present

## 2018-01-05 ENCOUNTER — Telehealth: Payer: Self-pay | Admitting: Cardiology

## 2018-01-05 ENCOUNTER — Ambulatory Visit (INDEPENDENT_AMBULATORY_CARE_PROVIDER_SITE_OTHER): Payer: Medicare Other | Admitting: *Deleted

## 2018-01-05 DIAGNOSIS — I495 Sick sinus syndrome: Secondary | ICD-10-CM

## 2018-01-05 NOTE — Telephone Encounter (Signed)
Spoke with pt and reminded pt of remote transmission that is due today. Pt verbalized understanding.   

## 2018-01-05 NOTE — Progress Notes (Signed)
Remote pacemaker transmission.   

## 2018-01-06 ENCOUNTER — Encounter (INDEPENDENT_AMBULATORY_CARE_PROVIDER_SITE_OTHER): Payer: Self-pay | Admitting: *Deleted

## 2018-01-06 ENCOUNTER — Other Ambulatory Visit: Payer: Self-pay | Admitting: Internal Medicine

## 2018-01-06 ENCOUNTER — Ambulatory Visit (HOSPITAL_COMMUNITY)
Admission: RE | Admit: 2018-01-06 | Discharge: 2018-01-06 | Disposition: A | Discharge status: Home / Self Care | Payer: Medicare Other | Source: Ambulatory Visit | Attending: Internal Medicine | Admitting: Internal Medicine

## 2018-01-06 DIAGNOSIS — Z950 Presence of cardiac pacemaker: Secondary | ICD-10-CM | POA: Diagnosis not present

## 2018-01-06 DIAGNOSIS — R0602 Shortness of breath: Secondary | ICD-10-CM

## 2018-01-06 DIAGNOSIS — I517 Cardiomegaly: Secondary | ICD-10-CM | POA: Insufficient documentation

## 2018-01-06 DIAGNOSIS — R079 Chest pain, unspecified: Secondary | ICD-10-CM | POA: Diagnosis not present

## 2018-01-08 ENCOUNTER — Other Ambulatory Visit (HOSPITAL_COMMUNITY): Payer: Self-pay | Admitting: Emergency Medicine

## 2018-01-08 ENCOUNTER — Encounter: Payer: Self-pay | Admitting: Cardiology

## 2018-01-08 DIAGNOSIS — C9001 Multiple myeloma in remission: Secondary | ICD-10-CM

## 2018-01-08 MED ORDER — LENALIDOMIDE 10 MG PO CAPS: ORAL_CAPSULE | ORAL | 0 refills | Status: DC

## 2018-01-08 NOTE — Progress Notes (Signed)
revlimid refilled

## 2018-01-10 ENCOUNTER — Other Ambulatory Visit: Payer: Self-pay | Admitting: Cardiovascular Disease

## 2018-01-12 ENCOUNTER — Inpatient Hospital Stay (HOSPITAL_COMMUNITY): Payer: Medicare Other | Attending: Oncology

## 2018-01-12 ENCOUNTER — Inpatient Hospital Stay (HOSPITAL_COMMUNITY): Payer: Medicare Other

## 2018-01-12 ENCOUNTER — Inpatient Hospital Stay (HOSPITAL_BASED_OUTPATIENT_CLINIC_OR_DEPARTMENT_OTHER): Payer: Medicare Other | Admitting: Internal Medicine

## 2018-01-12 ENCOUNTER — Ambulatory Visit (HOSPITAL_COMMUNITY): Payer: Medicare Other

## 2018-01-12 ENCOUNTER — Encounter (HOSPITAL_COMMUNITY): Payer: Self-pay

## 2018-01-12 ENCOUNTER — Ambulatory Visit (HOSPITAL_COMMUNITY)
Admission: RE | Admit: 2018-01-12 | Discharge: 2018-01-12 | Disposition: A | Discharge status: Home / Self Care | Payer: Medicare Other | Source: Ambulatory Visit | Attending: Internal Medicine | Admitting: Internal Medicine

## 2018-01-12 VITALS — BP 144/73 | HR 63 | Temp 98.4°F | Resp 18 | Wt 189.8 lb

## 2018-01-12 DIAGNOSIS — E538 Deficiency of other specified B group vitamins: Secondary | ICD-10-CM

## 2018-01-12 DIAGNOSIS — D801 Nonfamilial hypogammaglobulinemia: Secondary | ICD-10-CM

## 2018-01-12 DIAGNOSIS — M898X9 Other specified disorders of bone, unspecified site: Secondary | ICD-10-CM | POA: Diagnosis not present

## 2018-01-12 DIAGNOSIS — Z8579 Personal history of other malignant neoplasms of lymphoid, hematopoietic and related tissues: Secondary | ICD-10-CM | POA: Diagnosis not present

## 2018-01-12 DIAGNOSIS — C9001 Multiple myeloma in remission: Secondary | ICD-10-CM

## 2018-01-12 DIAGNOSIS — Z8572 Personal history of non-Hodgkin lymphomas: Secondary | ICD-10-CM

## 2018-01-12 DIAGNOSIS — C9000 Multiple myeloma not having achieved remission: Secondary | ICD-10-CM | POA: Insufficient documentation

## 2018-01-12 LAB — LACTATE DEHYDROGENASE: LDH: 145 U/L (ref 98–192)

## 2018-01-12 LAB — CBC WITH DIFFERENTIAL/PLATELET
BASOS PCT: 0 %
Basophils Absolute: 0 10*3/uL (ref 0.0–0.1)
Eosinophils Absolute: 0.1 10*3/uL (ref 0.0–0.7)
Eosinophils Relative: 2 %
HCT: 37 % — ABNORMAL LOW (ref 39.0–52.0)
HEMOGLOBIN: 11.6 g/dL — AB (ref 13.0–17.0)
Lymphocytes Relative: 64 %
Lymphs Abs: 4.3 10*3/uL — ABNORMAL HIGH (ref 0.7–4.0)
MCH: 27.8 pg (ref 26.0–34.0)
MCHC: 31.4 g/dL (ref 30.0–36.0)
MCV: 88.7 fL (ref 78.0–100.0)
MONO ABS: 0.6 10*3/uL (ref 0.1–1.0)
MONOS PCT: 9 %
NEUTROS PCT: 25 %
Neutro Abs: 1.7 10*3/uL (ref 1.7–7.7)
PLATELETS: 108 10*3/uL — AB (ref 150–400)
RBC: 4.17 MIL/uL — ABNORMAL LOW (ref 4.22–5.81)
RDW: 16.5 % — AB (ref 11.5–15.5)
WBC: 6.8 10*3/uL (ref 4.0–10.5)

## 2018-01-12 LAB — COMPREHENSIVE METABOLIC PANEL
ALBUMIN: 3.4 g/dL — AB (ref 3.5–5.0)
ALT: 12 U/L — ABNORMAL LOW (ref 17–63)
ANION GAP: 9 (ref 5–15)
AST: 23 U/L (ref 15–41)
Alkaline Phosphatase: 84 U/L (ref 38–126)
BUN: 10 mg/dL (ref 6–20)
CHLORIDE: 109 mmol/L (ref 101–111)
CO2: 23 mmol/L (ref 22–32)
Calcium: 9.1 mg/dL (ref 8.9–10.3)
Creatinine, Ser: 0.83 mg/dL (ref 0.61–1.24)
GFR calc non Af Amer: >60 mL/min (ref >60)
GLUCOSE: 102 mg/dL — AB (ref 65–99)
Potassium: 4.2 mmol/L (ref 3.5–5.1)
SODIUM: 141 mmol/L (ref 135–145)
Total Bilirubin: 0.3 mg/dL (ref 0.3–1.2)
Total Protein: 6.7 g/dL (ref 6.5–8.1)

## 2018-01-12 MED ORDER — IMMUNE GLOBULIN (HUMAN) 10 GM/100ML IV SOLN
400.0000 mg/kg | Freq: Once | INTRAVENOUS | Status: AC
  Administered 2018-01-12: 35 g via INTRAVENOUS
  Filled 2018-01-12: qty 100

## 2018-01-12 MED ORDER — HEPARIN SOD (PORK) LOCK FLUSH 100 UNIT/ML IV SOLN
500.0000 [IU] | Freq: Once | INTRAVENOUS | Status: AC | PRN
  Administered 2018-01-12: 500 [IU]

## 2018-01-12 MED ORDER — ACETAMINOPHEN 325 MG PO TABS
ORAL_TABLET | ORAL | Status: AC
  Filled 2018-01-12: qty 2

## 2018-01-12 MED ORDER — ACETAMINOPHEN 325 MG PO TABS
650.0000 mg | ORAL_TABLET | Freq: Once | ORAL | Status: AC
  Administered 2018-01-12: 650 mg via ORAL

## 2018-01-12 MED ORDER — DEXTROSE 5 % IV SOLN
INTRAVENOUS | Status: DC
  Administered 2018-01-12: 10:00:00 via INTRAVENOUS

## 2018-01-12 MED ORDER — CYANOCOBALAMIN 1000 MCG/ML IJ SOLN
1000.0000 ug | Freq: Once | INTRAMUSCULAR | Status: AC
  Administered 2018-01-12: 1000 ug via INTRAMUSCULAR

## 2018-01-12 MED ORDER — SODIUM CHLORIDE 0.9 % IJ SOLN
10.0000 mL | INTRAMUSCULAR | Status: DC | PRN
  Administered 2018-01-12: 10 mL
  Filled 2018-01-12: qty 10

## 2018-01-12 MED ORDER — CYANOCOBALAMIN 1000 MCG/ML IJ SOLN
INTRAMUSCULAR | Status: AC
  Filled 2018-01-12: qty 1

## 2018-01-12 NOTE — Progress Notes (Signed)
1000 Labs reviewed with and pt seen by Dr. Walden Field and pt approved for IVIG infusion per MD                       Jeremy Parrish tolerated IVIIG infusion and Vit B12 injection well without complaints or incident. VSS upon discharge. Pt discharged self ambulatory in satisfactory condition

## 2018-01-12 NOTE — Patient Instructions (Signed)
[facility] at [facility] Discharge Instructions  Received IVIG infusion and Vit B12 injection today. Follow-up as scheduled. Call clinic for any questions or concerns   Thank you for choosing [facility] at [facility] to provide your oncology and hematology care.  To afford each patient quality time with our provider, please arrive at least 15 minutes before your scheduled appointment time.   If you have a lab appointment with the Cancer Center please come in thru the  Main Entrance and check in at the main information desk  You need to re-schedule your appointment should you arrive 10 or more minutes late.  We strive to give you quality time with our providers, and arriving late affects you and other patients whose appointments are after yours.  Also, if you no show three or more times for appointments you may be dismissed from the clinic at the providers discretion.     Again, thank you for choosing [facility].  Our hope is that these requests will decrease the amount of time that you wait before being seen by our physicians.       _____________________________________________________________  Should you have questions after your visit to [facility], please contact our office at [phone] between the hours of 8:30 a.m. and 4:30 p.m.  Voicemails left after 4:30 p.m. will not be returned until the following business day.  For prescription refill requests, have your pharmacy contact our office.       Resources For Cancer Patients and their Caregivers ? American Cancer Society: Can assist with transportation, wigs, general needs, runs Look Good Feel Better.        1-888-227-6333 ? Cancer Care: Provides financial assistance, online support groups, medication/co-pay assistance.  1-800-813-HOPE (4673) ? [facility] Assists [place] cancer patients and their families through emotional ,  educational and financial support.  336-427-4357 ? [place] DSS Where to apply for food stamps, Medicaid and utility assistance. 336-342-1394 ? RCATS: Transportation to medical appointments. 336-347-2287 ? Social Security Administration: May apply for disability if have a Stage IV cancer. 336-342-7796 1-800-772-1213 ? [place] Aging, Disability and Transit Services: Assists with nutrition, care and transit needs. 336-349-2343  Cancer Center Support Programs:   > Cancer Support Group  2nd Tuesday of the month 1pm-2pm, Journey Room   > Creative Journey  3rd Tuesday of the month 1130am-1pm, Journey Room    

## 2018-01-12 NOTE — Patient Instructions (Addendum)
[place] at [facility] Discharge Instructions   You were seen today by Dr. Zoila Shutter You will get your IVIG today and your B-12 injection We will set you up for a bone survey and this will help [place] look at your ribs also. Follow up in April    Thank you for choosing [place] at [facility] to provide your oncology and hematology care.  To afford each patient quality time with our provider, please arrive at least 15 minutes before your scheduled appointment time.    If you have a lab appointment with the [facility] please come in thru the  Main Entrance and check in at the main information desk  You need to re-schedule your appointment should you arrive 10 or more minutes late.  We strive to give you quality time with our providers, and arriving late affects you and other patients whose appointments are after yours.  Also, if you no show three or more times for appointments you may be dismissed from the clinic at the providers discretion.     Again, thank you for choosing [facility].  Our hope is that these requests will decrease the amount of time that you wait before being seen by our physicians.       _____________________________________________________________  Should you have questions after your visit to [facility], please contact our office at [phone] between the hours of 8:30 a.m. and 4:30 p.m.  Voicemails left after 4:30 p.m. will not be returned until the following business day.  For prescription refill requests, have your pharmacy contact our office.       Resources For Cancer Patients and their Caregivers ? American Cancer Society: Can assist with transportation, wigs, general needs, runs Look Good Feel Better.        (931)140-5171 ? Cancer Care: Provides financial assistance, online support groups, medication/co-pay assistance.  1-800-813-HOPE 479-502-0686) ? [facility] Assists [place] cancer patients and their families through emotional , educational and financial support.  360-240-3728 ? [place] DSS Where to apply for food stamps, Medicaid and utility assistance. 559-027-5903 ? RCATS: Transportation to medical appointments. 512 170 4996 ? Social Security Administration: May apply for disability if have a Stage IV cancer. (620) 085-4519 321-871-2076 ? [employer], Disability and Transit Services: Assists with nutrition, care and transit needs. [place] Support Programs:   > Cancer Support Group  2nd Tuesday of the month 1pm-2pm, Journey Room   > Creative Journey  3rd Tuesday of the month 1130am-1pm, Journey Room

## 2018-01-13 LAB — PROTEIN ELECTROPHORESIS, SERUM
A/G RATIO SPE: 1.1 (ref 0.7–1.7)
Albumin ELP: 3.4 g/dL (ref 2.9–4.4)
Alpha-1-Globulin: 0.2 g/dL (ref 0.0–0.4)
Alpha-2-Globulin: 0.7 g/dL (ref 0.4–1.0)
BETA GLOBULIN: 1 g/dL (ref 0.7–1.3)
GLOBULIN, TOTAL: 3 g/dL (ref 2.2–3.9)
Gamma Globulin: 1.1 g/dL (ref 0.4–1.8)
TOTAL PROTEIN ELP: 6.4 g/dL (ref 6.0–8.5)

## 2018-01-13 LAB — KAPPA/LAMBDA LIGHT CHAINS
Kappa free light chain: 56.5 mg/L — ABNORMAL HIGH (ref 3.3–19.4)
Kappa, lambda light chain ratio: 1.45 (ref 0.26–1.65)
Lambda free light chains: 39 mg/L — ABNORMAL HIGH (ref 5.7–26.3)

## 2018-01-13 LAB — IGG, IGA, IGM
IGA: 434 mg/dL (ref 61–437)
IGM (IMMUNOGLOBULIN M), SRM: 45 mg/dL (ref 15–143)
IgG (Immunoglobin G), Serum: 1090 mg/dL (ref 700–1600)

## 2018-01-13 MED ORDER — OCTREOTIDE ACETATE 30 MG IM KIT
PACK | INTRAMUSCULAR | Status: AC
  Filled 2018-01-13: qty 1

## 2018-01-14 LAB — IMMUNOFIXATION ELECTROPHORESIS
IGA: 428 mg/dL (ref 61–437)
IGG (IMMUNOGLOBIN G), SERUM: 1081 mg/dL (ref 700–1600)
IgM (Immunoglobulin M), Srm: 42 mg/dL (ref 15–143)
Total Protein ELP: 6.5 g/dL (ref 6.0–8.5)

## 2018-01-20 LAB — CUP PACEART REMOTE DEVICE CHECK
Brady Statistic AP VS Percent: 89.03 %
Brady Statistic RA Percent Paced: 87.92 %
Date Time Interrogation Session: 20190225193808
Implantable Lead Implant Date: 20120910
Implantable Lead Location: 753859
Lead Channel Impedance Value: 384 Ohm
Lead Channel Sensing Intrinsic Amplitude: 4.376 mV
Lead Channel Setting Sensing Sensitivity: 0.9 mV
MDC IDC LEAD IMPLANT DT: 20120910
MDC IDC LEAD LOCATION: 753860
MDC IDC MSMT BATTERY VOLTAGE: 2.96 V
MDC IDC MSMT LEADCHNL RA SENSING INTR AMPL: 1.863 mV
MDC IDC MSMT LEADCHNL RV IMPEDANCE VALUE: 384 Ohm
MDC IDC PG IMPLANT DT: 20120910
MDC IDC SET LEADCHNL RA PACING AMPLITUDE: 2 V
MDC IDC SET LEADCHNL RV PACING AMPLITUDE: 2.5 V
MDC IDC SET LEADCHNL RV PACING PULSEWIDTH: 0.6 ms
MDC IDC STAT BRADY AP VP PERCENT: 0.8 %
MDC IDC STAT BRADY AS VP PERCENT: 0.02 %
MDC IDC STAT BRADY AS VS PERCENT: 10.15 %
MDC IDC STAT BRADY RV PERCENT PACED: 0.87 %

## 2018-01-28 ENCOUNTER — Encounter: Payer: Self-pay | Admitting: Cardiovascular Disease

## 2018-01-28 ENCOUNTER — Ambulatory Visit (INDEPENDENT_AMBULATORY_CARE_PROVIDER_SITE_OTHER): Payer: Medicare Other | Admitting: Cardiovascular Disease

## 2018-01-28 VITALS — BP 132/74 | HR 67 | Ht 67.0 in | Wt 194.4 lb

## 2018-01-28 DIAGNOSIS — I10 Essential (primary) hypertension: Secondary | ICD-10-CM | POA: Diagnosis not present

## 2018-01-28 DIAGNOSIS — R4701 Aphasia: Secondary | ICD-10-CM | POA: Diagnosis not present

## 2018-01-28 DIAGNOSIS — I2510 Atherosclerotic heart disease of native coronary artery without angina pectoris: Secondary | ICD-10-CM | POA: Diagnosis not present

## 2018-01-28 DIAGNOSIS — E7800 Pure hypercholesterolemia, unspecified: Secondary | ICD-10-CM

## 2018-01-28 DIAGNOSIS — Z7901 Long term (current) use of anticoagulants: Secondary | ICD-10-CM | POA: Diagnosis not present

## 2018-01-28 DIAGNOSIS — K521 Toxic gastroenteritis and colitis: Secondary | ICD-10-CM | POA: Diagnosis not present

## 2018-01-28 DIAGNOSIS — I480 Paroxysmal atrial fibrillation: Secondary | ICD-10-CM

## 2018-01-28 DIAGNOSIS — Z950 Presence of cardiac pacemaker: Secondary | ICD-10-CM | POA: Diagnosis not present

## 2018-01-28 DIAGNOSIS — I495 Sick sinus syndrome: Secondary | ICD-10-CM

## 2018-01-28 NOTE — Patient Instructions (Signed)
Dr Sallyanne Kuster has recommended making the following medication changes: 1. STOP Prilosec  OKAY to take EITHER Ranitidine 150 mg OR famotidine 20 mg for acid reflux  PLEASE ASK YOUR ONCOLOGIST about using Cholestyramine for diarrhea  Remote monitoring is used to monitor your Pacemaker or ICD from home. This monitoring reduces the number of office visits required to check your device to one time per year. It allows [place] to keep an eye on the functioning of your device to ensure it is working properly. You are scheduled for a device check from home on Tuesday, May 28th, 2019. You may send your transmission at any time that day. If you have a wireless device, the transmission will be sent automatically. After your physician reviews your transmission, you will receive a notification with your next transmission date.  Dr Sallyanne Kuster recommends that you schedule a follow-up appointment in 12 months with a pacemaker check. You will receive a reminder letter in the mail two months in advance. If you don't receive a letter, please call our office to schedule the follow-up appointment.  If you need a refill on your cardiac medications before your next appointment, please call your pharmacy.

## 2018-01-28 NOTE — Progress Notes (Signed)
Patient ID: OSMAN CALZADILLA, male   DOB: 11-15-1940, 77 y.o.   MRN: [id number]    Cardiology Office Note    Date:  01/28/2018   ID:  Jeremy Parrish, DOB 03-20-1941, MRN [id number]  PCP:  Celene Squibb, MD  Cardiologist:  Quay Burow, M.D. Sanda Klein, MD   Chief Complaint  Patient presents with  . Atrial Fibrillation  . Pacemaker Check    History of Present Illness:  ZYIR GASSERT is a 77 y.o. male who returns for pacemaker follow-up. 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   He had a stroke in Nov 7253, complicated by significant residual expressive aphasia. He was treated with clopidogrel for many years. No atrial fibrillation was detected on his pacemaker at the time of the stroke, but atrial fibrillation was subsequently detected in 2017 and oral anticoagulants were added to clopidogrel.   He has had a couple more falls.  Occurred when he was throwing a heavy tree limb and lost balance.  Another episode occurred next to the commode, after he had been throwing up he slipped sideways.  Neither episode was associated with severe injury head impact or bleeding problems or even any serious bruising.  Did have some sore ribs after the first fall.  Pacemaker interrogation shows a single significant episode of atrial fibrillation since his last check.  Roughly 5 minutes atrial fibrillation trolled ventricular rate device is also recorded a couple of brief episodes of nonsustained VT that were asymptomatic.  He has gained weight and is less physically active.  His biggest complaint is incessant diarrhea, sometimes 6 times a day.  Always worse on the weeks that he takes Revlimid  Pacemaker interrogation shows normal device function. The burden of atrial fibrillation  is very low. He is 93% atrial paced ventricular sensed rhythm. Pacing in the ventricle occurs only 1.2% of the time.  Battery voltage is at 2.95 V (ERI 2.81 V). His device is a Medtronic Revo MRI conditional device.  Past Medical History:  Diagnosis Date  . Anemia   . Aortic aneurysm of unspecified site without mention of rupture   . Arthritis   . Bladder neck contracture   . Cancer ([place])   . Cerebral atherosclerosis    Carotid Doppler, 02/16/2013 - Bilateral Proximal ICAs,demonstrate mild plaque w/o evidence of significant diameter reduction, dissection, or any other vascular abnormality  . CHF (congestive heart failure) ([place])   . Complication of anesthesia   . COPD (chronic obstructive pulmonary disease) ([place])   . Coronary artery disease   . Depression   . Esophageal reflux   . Heart disease   . Heart murmur   . Hx of bladder cancer 10/07/2012  . Hyperlipidemia   . Hypertension   . 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 Intestinovesical fistula   . Kidney stones    history  . Lung mass   . Multiple myeloma   . Myocardial infarction [employer])    '96  . Non Hodgkin's lymphoma ([place])   . Paroxysmal atrial fibrillation ([place]) 01/02/2016  . Peripheral arterial disease ([place])   . Personal history of other diseases of circulatory system   . PONV (postoperative nausea and vomiting)   . Prostate cancer ([place]) 2000  . Shingles   .  Shortness of breath   . Sleep apnea    05-02-14 cpap , not yet used- suggested settings 5  . Stroke [facility]) 2013   Speech.    Past Surgical History:  Procedure Laterality Date  . BLADDER SURGERY    . BONE MARROW TRANSPLANT  2011  . COLON SURGERY     colon resection  . COLONOSCOPY N/A 01/01/2013   Procedure: COLONOSCOPY;  Surgeon: Rogene Houston, MD;  Location: [facility] END[facility];  Service: Endoscopy;  Laterality: N/A;  825-moved to [place] notified pt  . CORONARY ANGIOPLASTY  06/24/2000   PCI and  stenting in mid & proximal RCA  . heart stents x 5  1999  . INGUINAL HERNIA REPAIR Right 05/04/2014   Procedure: OPEN RIGHT INGUINAL HERNIA REPAIR with mesh;  Surgeon: Edward Jolly, MD;  Location: [facility] ORS;  Service: General;  Laterality: Right;  . INSERT / REPLACE / REMOVE PACEMAKER    . left ear skin cancer removed    . NM MYOCAR PERF WALL MOTION  11/27/2007   inferior scar  . PACEMAKER INSERTION  07/22/2011   Medtronic  . PORTACATH PLACEMENT  07/26/2009   right chest  . PROSTATE SURGERY    . Rotator    . ROTATOR CUFF REPAIR Right   . SHOULDER ARTHROSCOPY WITH SUBACROMIAL DECOMPRESSION Right 07/21/2013   Procedure: RIGHT SHOULDER ARTHROSCOPY WITH SUBACROMIAL DECOMPRESSION AND DEBRIDEMENT & Injection of Left Shoulder;  Surgeon: Alta Corning, MD;  Location: [facility];  Service: Orthopedics;  Laterality: Right;  . TEE WITHOUT CARDIOVERSION  10/13/2012   Procedure: TRANSESOPHAGEAL ECHOCARDIOGRAM (TEE);  Surgeon: Sanda Klein, MD;  Location: [facility] ENDOSCOPY;  Service: Cardiovascular;  Laterality: N/A;  pat/kay/echo notified  . US ECHOCARDIOGRAPHY  06/19/2011   RV mildly dilated,mild to mod. MR,mild AI,mild PI  . WRIST SURGERY     right    Outpatient Medications Prior to Visit  Medication Sig Dispense Refill  . albuterol (PROVENTIL HFA;VENTOLIN HFA) 108 (90 Base) MCG/ACT inhaler Inhale 2 puffs into the lungs every 6 (six) hours as needed for wheezing or shortness of breath. 1 Inhaler 3  . albuterol (PROVENTIL) (2.5 MG/3ML) 0.083% nebulizer solution Use via neb q 4 hours prn wheezing 75 vial 2  . ALPRAZolam (XANAX) 0.5 MG tablet Take 1-2 tablets (0.5-1 mg total) by mouth at bedtime as needed for anxiety. 90 tablet 1  . amLODipine (NORVASC) 10 MG tablet TAKE 1 TABLET (10 MG TOTAL) BY MOUTH EVERY MORNING. 90 tablet 1  . budesonide (PULMICORT) 0.5 MG/2ML nebulizer solution Take 2 mLs (0.5 mg total) by nebulization 2 (two) times daily. (Patient taking differently: Take 0.5 mg by nebulization daily as  needed (for shortness of breath). ) 120 mL 2  . citalopram (CELEXA) 40 MG tablet TAKE 1 TABLET (40 MG TOTAL) BY MOUTH DAILY. 90 tablet 1  . clopidogrel (PLAVIX) 75 MG tablet TAKE 1 TABLET BY MOUTH EVERY DAY 90 tablet 2  . diclofenac sodium (VOLTAREN) 1 % GEL Apply 2 g topically 4 (four) times daily as needed. 1 Tube 11  . ELIQUIS 5 MG TABS tablet TAKE 1 TABLET BY MOUTH TWICE A DAY 180 tablet 1  . furosemide (LASIX) 20 MG tablet Use sparingly as needed for edema. No more than one tablet by mouth per week. 21 tablet 0  . HYDROcodone-acetaminophen (NORCO/VICODIN) 5-325 MG tablet Take 1 tablet by mouth every 6 (six) hours as needed. 60 tablet 0  . HYDROcodone-homatropine (HYCODAN) 5-1.5 MG/5ML syrup Take one tsp qhs prn  cough 90 mL 0  . KLOR-CON M20 20 MEQ tablet TAKE 1 TABLET BY MOUTH 3 TIMES A DAY 270 tablet 3  . lenalidomide (REVLIMID) 10 MG capsule TAKE 1 CAPSULE BY MOUTH DAILY FOR 7 DAYS ON FOLLOWED BY 7 DAYS OFF. THEN REPEAT CYCLE. 14 capsule 0  . levocetirizine (XYZAL) 5 MG tablet Take 5 mg by mouth daily as needed for allergies.     Marland Kitchen LYRICA 75 MG capsule     . nitroGLYCERIN (NITROSTAT) 0.4 MG SL tablet Place 1 tablet (0.4 mg total) under the tongue every 5 (five) minutes as needed for chest pain. 25 tablet 4  . simvastatin (ZOCOR) 20 MG tablet TAKE 1 TABLET BY MOUTH EVERYDAY AT BEDTIME 90 tablet 1  . KLOR-CON M20 20 MEQ tablet Take 1 tablet (20 mEq total) by mouth 3 (three) times daily. Reported on 11/15/2015 270 tablet 2  . omeprazole (PRILOSEC) 20 MG capsule TAKE 1 CAPSULE (20 MG TOTAL) BY MOUTH DAILY. 90 capsule 1  . amoxicillin-clavulanate (AUGMENTIN) 875-125 MG tablet One bid ten d 20 tablet 0  . chlorzoxazone (PARAFON) 500 MG tablet Take 1 tablet (500 mg total) by mouth 3 (three) times daily as needed for muscle spasms. 60 tablet 2  . ketoconazole (NIZORAL) 2 % cream APPLY 1 APPLICATION TOPICALLY 2 (TWO) TIMES DAILY. 30 g 2  . predniSONE (DELTASONE) 10 MG tablet TAKE 4 TABS ON DAY 1, 3  TABS ON DAY 2, TAKE 2 TABS DAY 3, AND 1 TAB DAY 4  0  . predniSONE (DELTASONE) 20 MG tablet Three qd for three d. Two d for three d, one qd for two days 17 tablet 0   Facility-Administered Medications Prior to Visit  Medication Dose Route Frequency Provider Last Rate Last Dose  . acetaminophen (TYLENOL) tablet 650 mg  650 mg Oral Q6H PRN Baird Cancer, PA-C   650 mg at 12/09/14 0855  . sodium chloride 0.9 % injection 10 mL  10 mL Intracatheter PRN Baird Cancer, PA-C   10 mL at 12/09/14 9485     Allergies:   Morphine and related and Tape   Social History   Socioeconomic History  . Marital status: Married    Spouse name: [name]  . Number of children: 3  . Years of education: 9th  . Highest education level: None  Social Needs  . Financial resource strain: None  . Food insecurity - worry: None  . Food insecurity - inability: None  . Transportation needs - medical: None  . Transportation needs - non-medical: None  Occupational History  . Occupation: retired   Tobacco Use  . Smoking status: Former Smoker    Packs/day: 1.00    Years: 20.00    Pack years: 20.00    Types: Cigarettes    Last attempt to quit: 11/14/1994    Years since quitting: 23.2  . Smokeless tobacco: Never Used  Substance and Sexual Activity  . Alcohol use: No    Alcohol/week: 0.0 oz    Comment: previously drank but none for at least 15 years.  . Drug use: No  . Sexual activity: None  Other Topics Concern  . None  Social History Narrative   Patient lives at home spouse.   Caffeine Use: Occasionally     Family History:  The patient's family history includes Cancer in his brother and father; Heart attack in his sister; Heart disease in his brother, father, and sister; Hyperlipidemia in his sister; Hypertension in his mother and sister.  ROS:   Please see the history of present illness.    ROS All other systems reviewed and are negative.   PHYSICAL EXAM:   VS:  BP 132/74   Pulse 67   Ht 5'  7" (1.702 m)   Wt 194 lb 6.4 oz (88.2 kg)   BMI 30.45 kg/m     General: Alert, oriented x3, no distress, mildly obese Head: no evidence of trauma, PERRL, EOMI, no exophtalmos or lid lag, no myxedema, no xanthelasma; normal ears, nose and oropharynx Neck: normal jugular venous pulsations and no hepatojugular reflux; brisk carotid pulses without delay and no carotid bruits Chest: clear to auscultation, no signs of consolidation by percussion or palpation, normal fremitus, symmetrical and full respiratory excursions Cardiovascular: normal position and quality of the apical impulse, regular rhythm, normal first and second heart sounds, no murmurs, rubs or gallops, healthy left subclavian pacemaker site Abdomen: no tenderness or distention, no masses by palpation, no abnormal pulsatility or arterial bruits, normal bowel sounds, no hepatosplenomegaly Extremities: no clubbing, cyanosis or edema; 2+ radial, ulnar and brachial pulses bilaterally; 2+ right femoral, posterior tibial and dorsalis pedis pulses; 2+ left femoral, posterior tibial and dorsalis pedis pulses; no subclavian or femoral bruits Neurological: grossly nonfocal except for expressive a aphasia, which worsens if he becomes emotional Psych: Normal mood and affect   Wt Readings from Last 3 Encounters:  01/28/18 194 lb 6.4 oz (88.2 kg)  01/12/18 189 lb 12.8 oz (86.1 kg)  12/15/17 188 lb 12.8 oz (85.6 kg)      Studies/Labs Reviewed:   EKG:  EKG is ordered today.  Shows atrial paced ventricular sensed rhythm with a long AV delay of 222 ms, right bundle branch block, left anterior fascicular block, QTC 460 ms without ischemic repolarization abnormalities  Recent Labs: 01/12/2018: ALT 12; BUN 10; Creatinine, Ser 0.83; Hemoglobin 11.6; Platelets 108; Potassium 4.2; Sodium 141   Lipid Panel    Component Value Date/Time   CHOL 108 12/15/2017 0757   TRIG 231 (H) 12/15/2017 0757   HDL 27 (L) 12/15/2017 0757   CHOLHDL 4.0 12/15/2017 0757     VLDL 46 (H) 12/15/2017 0757   LDLCALC 35 12/15/2017 0757     ASSESSMENT:    1. Paroxysmal atrial fibrillation (HCC)   2. SSS (sick sinus syndrome) ([place])   3. 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 Long term (current) use of anticoagulants   5. Atherosclerosis of native coronary artery of native heart without angina pectoris   6. Expressive aphasia   7. Hypercholesteremia   8. Essential hypertension   9. Diarrhea due to drug      PLAN:  In order of problems listed above:  1. PAF: Infrequent, asymptomatic when it occurs. CHADSVasc score 6 (age, CAD, LV dysfunction, HTN, previous CVA). On Eliquis and clopidogrel. Specific antiarrhythmic therapy does not appear to be indicated 2. SSS: Underlying rhythm is sinus bradycardia, he is not pacemaker dependent, heart rate histogram distribution suggests appropriate sensor settings.. 3. PPM: Normal pacemaker function.  Remote downloads every 3 months and yearly office visit 4. Eliquis: Remain very concerned about the risk of injury while on anticoagulation, due to his falls.  I am not sure why he is so prone to falling.  May have some more subtle residual deficit from his stroke.  Encouraged him to maintain strong legs and exercise regularly (he used to use a stationary bike on a regular basis).  Risk of  recurrent stroke still appears to be higher than risk of bleeding 5. CAD: asymptomatic, no angina 6. S/P CVA:  residual expressive aphasia 7. HLP: LDL at target 8. HTN: Usually well controlled 9. Diarrhea: I suggested that he stop the omeprazole and try taking an H2 blocker instead.  He should ask his oncologist whether he could take cholestyramine, 12 hours separated from the dose of Revlimid.    Medication Adjustments/Labs and Tests Ordered: Current medicines are reviewed at length with the patient today.  Concerns regarding medicines are outlined above.  Medication  changes, Labs and Tests ordered today are listed in the Patient Instructions below. Patient Instructions  Dr Sallyanne Kuster has recommended making the following medication changes: 1. STOP Prilosec  OKAY to take EITHER Ranitidine 150 mg OR famotidine 20 mg for acid reflux  PLEASE ASK YOUR ONCOLOGIST about using Cholestyramine for diarrhea  Remote monitoring is used to monitor your Pacemaker or ICD from home. This monitoring reduces the number of office visits required to check your device to one time per year. It allows [place] to keep an eye on the functioning of your device to ensure it is working properly. You are scheduled for a device check from home on Tuesday, May 28th, 2019. You may send your transmission at any time that day. If you have a wireless device, the transmission will be sent automatically. After your physician reviews your transmission, you will receive a notification with your next transmission date.  Dr Sallyanne Kuster recommends that you schedule a follow-up appointment in 12 months with a pacemaker check. You will receive a reminder letter in the mail two months in advance. If you don't receive a letter, please call our office to schedule the follow-up appointment.  If you need a refill on your cardiac medications before your next appointment, please call your pharmacy.      Signed, Sanda Klein, MD  01/28/2018 2:46 PM    [address], [place], [place]  [address] Phone: [phone]; Fax: [phone]

## 2018-01-30 DIAGNOSIS — C61 Malignant neoplasm of prostate: Secondary | ICD-10-CM | POA: Diagnosis not present

## 2018-02-01 NOTE — Progress Notes (Signed)
Diagnosis Multiple myeloma, remission status unspecified ([place]) - Plan: CBC with Differential/Platelet, Comprehensive metabolic panel, Lactate dehydrogenase, Protein electrophoresis, serum, DG Bone Survey Met  Staging Cancer Staging No matching staging information was found for the patient.  Assessment and Plan: 1.  IgG lambda myeloma on maintenance revlimid since 2011. S/P autologous transplant at [facility] 08/24/2009.  He will be set up for CBC, CMP, LDH, SPEP, quant IG, Free light chains and Skeletal survey for follow-up.  He will RTC in 02/2018 to go over results.    2.  Hypogammaglobulinemia with recurrent infections.  Pt is here today for  IVIG which he is receiving monthly.    3.  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  Labs done 01/12/2018 WBC 6.8 HB 11.6 plts 106,000.  LDH 145.  CT abdomen done 11/2017 shows no findings of disease, spleen is normal.    4.  CVA with residual expressive aphasia.  Continue to follow-up with PCP.    5.  Ischemic cardiomyopathy/Sinus node dysfunction with dual chamber pacemaker.  Follow-up with PCP as directed.    6.  B12 deficiency.  He is here for monthly b12.  HB 11.6.     Current Status:  Pt is seen today for follow-up prior to IVIG and B12.   Problem List Patient Active Problem List   Diagnosis Date Noted  . Vitamin B12 deficiency [E53.8]   . Long term current use of anticoagulant [Z79.01] 12/25/2016  . COPD (chronic obstructive pulmonary disease) ([place]) [J44.9] 04/18/2016  . Paroxysmal atrial fibrillation ([place]) [I48.0] 01/02/2016  . Hypokalemia [E87.6] 11/03/2014  . URI (upper respiratory infection) [J06.9] 11/03/2014  . Weakness [R53.1] 11/03/2014  . Obstructive sleep apnea [G47.33] 08/07/2014  . Central sleep apnea [G47.31] 08/07/2014  . Inguinal hernia [K40.90] 05/04/2014  . Chest pain with moderate risk for cardiac etiology [R07.9] 11/25/2013  . Fever [R50.9] 09/13/2013  . Pneumonia [J18.9] 09/13/2013  . Pancytopenia ([place])  [E52.778] 09/13/2013  . Muscle weakness (generalized) [M62.81] 08/04/2013  . Status post arthroscopy of shoulder [Z98.890] 08/04/2013  . Pain in joint, shoulder region [M25.519] 08/04/2013  . Rotator cuff tear arthropathy of right shoulder [M75.101, M12.811] 07/21/2013  . Left rotator cuff tear arthropathy [M75.102, M12.812] 07/21/2013  . Shortness of breath [R06.02] 07/07/2013  . 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 Hyperlipidemia with target LDL less than 100 [E78.5] 10/08/2012  . Prediabetes [R73.03] 10/08/2012  . Expressive aphasia [R47.01] 10/08/2012  . Hemiplegia affecting right dominant side ([place]) [G81.91] 10/08/2012  . H/O cardiac pacemaker, Medtronic REVO, MRI conditional device, placed 07/2011 for sympyomatic bradycardia [Z95.0] 10/07/2012  . Hx of bladder cancer [Z85.51] 10/07/2012  . Lt CVA with expressive aphasia Nov 2013 [I63.40] 10/06/2012  . Hypogammaglobulinemia ([place]) [D80.1] 09/28/2012  . ASTHMA, UNSPECIFIED [J45.909] 03/22/2010  . Nonspecific (abnormal) findings on radiological and other examination of body structure [793] 03/22/2010  . Hx of lymphoma [Z85.72] 03/21/2010  . Multiple myeloma in remission ([place]) [C90.01] 03/21/2010  . Anxiety state [F41.1] 03/21/2010  . Essential hypertension [I10] 03/21/2010  . MYOCARDIAL INFARCTION [I21.9] 03/21/2010  . Coronary atherosclerosis [I25.10] 03/21/2010  . NEPHROLITHIASIS [N20.0] 03/21/2010  . ELEVATED PROSTATE SPECIFIC ANTIGEN [R97.20] 03/21/2010  . ROTATOR CUFF REPAIR, RIGHT, HX OF [Z98.89] 03/21/2010    Past Medical History Past Medical History:  Diagnosis Date  . Anemia   . Aortic aneurysm of unspecified site without mention of rupture   . Arthritis   . Bladder neck contracture   . Cancer ([place])   .  Cerebral atherosclerosis    Carotid Doppler, 02/16/2013 - Bilateral Proximal ICAs,demonstrate mild plaque w/o evidence of significant diameter reduction, dissection, or any other vascular abnormality  .  CHF (congestive heart failure) ([place])   . Complication of anesthesia   . COPD (chronic obstructive pulmonary disease) ([place])   . Coronary artery disease   . Depression   . Esophageal reflux   . Heart disease   . Heart murmur   . Hx of bladder cancer 10/07/2012  . Hyperlipidemia   . Hypertension   . Hypogammaglobulinemia ([place]) 09/28/2012   Secondary to Lymphoma and Multiple Myeloma and their treatments  . Intestinovesical fistula   . Kidney stones    history  . Lung mass   . Multiple myeloma   . Myocardial infarction [facility])    '96  . Non Hodgkin's lymphoma ([place])   . Paroxysmal atrial fibrillation ([place]) 01/02/2016  . Peripheral arterial disease ([place])   . Personal history of other diseases of circulatory system   . PONV (postoperative nausea and vomiting)   . Prostate cancer ([place]) 2000  . Shingles   . Shortness of breath   . Sleep apnea    05-02-14 cpap , not yet used- suggested settings 5  . Stroke [facility]) 2013   Speech.    Past Surgical History Past Surgical History:  Procedure Laterality Date  . BLADDER SURGERY    . BONE MARROW TRANSPLANT  2011  . COLON SURGERY     colon resection  . COLONOSCOPY N/A 01/01/2013   Procedure: COLONOSCOPY;  Surgeon: Rogene Houston, MD;  Location: [facility] ENDO SUITE;  Service: Endoscopy;  Laterality: N/A;  825-moved to [place] notified pt  . CORONARY ANGIOPLASTY  06/24/2000   PCI and stenting in mid & proximal RCA  . heart stents x 5  1999  . INGUINAL HERNIA REPAIR Right 05/04/2014   Procedure: OPEN RIGHT INGUINAL HERNIA REPAIR with mesh;  Surgeon: Edward Jolly, MD;  Location: [facility] ORS;  Service: General;  Laterality: Right;  . INSERT / REPLACE / REMOVE PACEMAKER    . left ear skin cancer removed    . NM MYOCAR PERF WALL MOTION  11/27/2007   inferior scar  . PACEMAKER INSERTION  07/22/2011   Medtronic  . PORTACATH PLACEMENT  07/26/2009   right chest  . PROSTATE SURGERY    . Rotator    . ROTATOR CUFF REPAIR Right   . SHOULDER ARTHROSCOPY WITH  SUBACROMIAL DECOMPRESSION Right 07/21/2013   Procedure: RIGHT SHOULDER ARTHROSCOPY WITH SUBACROMIAL DECOMPRESSION AND DEBRIDEMENT & Injection of Left Shoulder;  Surgeon: Alta Corning, MD;  Location: [place];  Service: Orthopedics;  Laterality: Right;  . TEE WITHOUT CARDIOVERSION  10/13/2012   Procedure: TRANSESOPHAGEAL ECHOCARDIOGRAM (TEE);  Surgeon: Sanda Klein, MD;  Location: [facility] ENDOSCOPY;  Service: Cardiovascular;  Laterality: N/A;  pat/kay/echo notified  . US ECHOCARDIOGRAPHY  06/19/2011   RV mildly dilated,mild to mod. MR,mild AI,mild PI  . WRIST SURGERY     right    Family History Family History  Problem Relation Age of Onset  . Cancer Father        bladder  . Heart disease Father        before age 38  . Hypertension Mother   . Cancer Brother   . Heart disease Brother        before age 44  . Heart disease Sister        before age 60  . Hyperlipidemia Sister   . Hypertension Sister   .  Heart attack Sister   . Colon cancer Neg Hx   . Colon polyps Neg Hx      Social History  reports that he quit smoking about 23 years ago. His smoking use included cigarettes. He has a 20.00 pack-year smoking history. He has never used smokeless tobacco. He reports that he does not drink alcohol or use drugs.  Medications  Current Outpatient Medications:  .  albuterol (PROVENTIL HFA;VENTOLIN HFA) 108 (90 Base) MCG/ACT inhaler, Inhale 2 puffs into the lungs every 6 (six) hours as needed for wheezing or shortness of breath., Disp: 1 Inhaler, Rfl: 3 .  albuterol (PROVENTIL) (2.5 MG/3ML) 0.083% nebulizer solution, Use via neb q 4 hours prn wheezing, Disp: 75 vial, Rfl: 2 .  ALPRAZolam (XANAX) 0.5 MG tablet, Take 1-2 tablets (0.5-1 mg total) by mouth at bedtime as needed for anxiety., Disp: 90 tablet, Rfl: 1 .  amLODipine (NORVASC) 10 MG tablet, TAKE 1 TABLET (10 MG TOTAL) BY MOUTH EVERY MORNING., Disp: 90 tablet, Rfl: 1 .  budesonide (PULMICORT) 0.5 MG/2ML nebulizer solution, Take 2 mLs (0.5 mg  total) by nebulization 2 (two) times daily. (Patient taking differently: Take 0.5 mg by nebulization daily as needed (for shortness of breath). ), Disp: 120 mL, Rfl: 2 .  citalopram (CELEXA) 40 MG tablet, TAKE 1 TABLET (40 MG TOTAL) BY MOUTH DAILY., Disp: 90 tablet, Rfl: 1 .  clopidogrel (PLAVIX) 75 MG tablet, TAKE 1 TABLET BY MOUTH EVERY DAY, Disp: 90 tablet, Rfl: 2 .  diclofenac sodium (VOLTAREN) 1 % GEL, Apply 2 g topically 4 (four) times daily as needed., Disp: 1 Tube, Rfl: 11 .  ELIQUIS 5 MG TABS tablet, TAKE 1 TABLET BY MOUTH TWICE A DAY, Disp: 180 tablet, Rfl: 1 .  furosemide (LASIX) 20 MG tablet, Use sparingly as needed for edema. No more than one tablet by mouth per week., Disp: 21 tablet, Rfl: 0 .  HYDROcodone-acetaminophen (NORCO/VICODIN) 5-325 MG tablet, Take 1 tablet by mouth every 6 (six) hours as needed., Disp: 60 tablet, Rfl: 0 .  HYDROcodone-homatropine (HYCODAN) 5-1.5 MG/5ML syrup, Take one tsp qhs prn cough, Disp: 90 mL, Rfl: 0 .  KLOR-CON M20 20 MEQ tablet, TAKE 1 TABLET BY MOUTH 3 TIMES A DAY, Disp: 270 tablet, Rfl: 3 .  lenalidomide (REVLIMID) 10 MG capsule, TAKE 1 CAPSULE BY MOUTH DAILY FOR 7 DAYS ON FOLLOWED BY 7 DAYS OFF. THEN REPEAT CYCLE., Disp: 14 capsule, Rfl: 0 .  levocetirizine (XYZAL) 5 MG tablet, Take 5 mg by mouth daily as needed for allergies. , Disp: , Rfl:  .  LYRICA 75 MG capsule, , Disp: , Rfl:  .  nitroGLYCERIN (NITROSTAT) 0.4 MG SL tablet, Place 1 tablet (0.4 mg total) under the tongue every 5 (five) minutes as needed for chest pain., Disp: 25 tablet, Rfl: 4 .  simvastatin (ZOCOR) 20 MG tablet, TAKE 1 TABLET BY MOUTH EVERYDAY AT BEDTIME, Disp: 90 tablet, Rfl: 1 No current facility-administered medications for this visit.   Facility-Administered Medications Ordered in Other Visits:  .  acetaminophen (TYLENOL) tablet 650 mg, 650 mg, Oral, Q6H PRN, Baird Cancer, PA-C, 650 mg at 12/09/14 0855 .  sodium chloride 0.9 % injection 10 mL, 10 mL, Intracatheter,  PRN, Baird Cancer, PA-C, 10 mL at 12/09/14 0856  Allergies Morphine and related and Tape  Review of Systems Review of Systems - Oncology ROS as per HPI otherwise 12 point ROS is negative.   Physical Exam  Vitals Wt Readings from Last 3 Encounters:  01/28/18 194 lb 6.4 oz (88.2 kg)  01/12/18 189 lb 12.8 oz (86.1 kg)  12/15/17 188 lb 12.8 oz (85.6 kg)   Temp Readings from Last 3 Encounters:  01/12/18 98.4 F (36.9 C) (Oral)  12/15/17 97.8 F (36.6 C) (Oral)  11/17/17 98.1 F (36.7 C) (Oral)   BP Readings from Last 3 Encounters:  01/28/18 132/74  01/12/18 (!) 144/73  12/15/17 125/61   Pulse Readings from Last 3 Encounters:  01/28/18 67  01/12/18 63  12/15/17 68   Constitutional: Well-developed, well-nourished, and in no distress.   HENT: Head: Normocephalic and atraumatic.  Mouth/Throat: No oropharyngeal exudate. Mucosa moist. Eyes: Pupils are equal, round, and reactive to light. Conjunctivae are normal. No scleral icterus.  Neck: Normal range of motion. Neck supple. No JVD present.  Cardiovascular: Normal rate, regular rhythm and normal heart sounds.  Exam reveals no gallop and no friction rub.   No murmur heard. Pulmonary/Chest: Effort normal and breath sounds normal. No respiratory distress. No wheezes.No rales.  Abdominal: Soft. Bowel sounds are normal. No distension. There is no tenderness. There is no guarding.  Musculoskeletal: No edema or tenderness.  Lymphadenopathy: No cervical,axillary or supraclavicular adenopathy.  Neurological: Alert.   Skin: Skin is warm and dry. No rash noted. No erythema. No pallor.  Psychiatric: Affect and judgment normal.   Labs Appointment on 01/12/2018  Component Date Value Ref Range Status  . WBC 01/12/2018 6.8  4.0 - 10.5 K/uL Final  . RBC 01/12/2018 4.17* 4.22 - 5.81 MIL/uL Final  . Hemoglobin 01/12/2018 11.6* 13.0 - 17.0 g/dL Final  . HCT 01/12/2018 37.0* 39.0 - 52.0 % Final  . MCV 01/12/2018 88.7  78.0 - 100.0 fL  Final  . MCH 01/12/2018 27.8  26.0 - 34.0 pg Final  . MCHC 01/12/2018 31.4  30.0 - 36.0 g/dL Final  . RDW 01/12/2018 16.5* 11.5 - 15.5 % Final  . Platelets 01/12/2018 108* 150 - 400 K/uL Final   Comment: PLATELET COUNT CONFIRMED BY SMEAR SPECIMEN CHECKED FOR CLOTS   . Neutrophils Relative % 01/12/2018 25  % Final  . Neutro Abs 01/12/2018 1.7  1.7 - 7.7 K/uL Final  . Lymphocytes Relative 01/12/2018 64  % Final  . Lymphs Abs 01/12/2018 4.3* 0.7 - 4.0 K/uL Final  . Monocytes Relative 01/12/2018 9  % Final  . Monocytes Absolute 01/12/2018 0.6  0.1 - 1.0 K/uL Final  . Eosinophils Relative 01/12/2018 2  % Final  . Eosinophils Absolute 01/12/2018 0.1  0.0 - 0.7 K/uL Final  . Basophils Relative 01/12/2018 0  % Final  . Basophils Absolute 01/12/2018 0.0  0.0 - 0.1 K/uL Final   Performed at [facility], [address] [place], [place] [address]  . Sodium 01/12/2018 141  135 - 145 mmol/L Final  . Potassium 01/12/2018 4.2  3.5 - 5.1 mmol/L Final  . Chloride 01/12/2018 109  101 - 111 mmol/L Final  . CO2 01/12/2018 23  22 - 32 mmol/L Final  . Glucose, Bld 01/12/2018 102* 65 - 99 mg/dL Final  . BUN 01/12/2018 10  6 - 20 mg/dL Final  . Creatinine, Ser 01/12/2018 0.83  0.61 - 1.24 mg/dL Final  . Calcium 01/12/2018 9.1  8.9 - 10.3 mg/dL Final  . Total Protein 01/12/2018 6.7  6.5 - 8.1 g/dL Final  . Albumin 01/12/2018 3.4* 3.5 - 5.0 g/dL Final  . AST 01/12/2018 23  15 - 41 U/L Final  . ALT 01/12/2018 12* 17 - 63 U/L Final  .  Alkaline Phosphatase 01/12/2018 84  38 - 126 U/L Final  . Total Bilirubin 01/12/2018 0.3  0.3 - 1.2 mg/dL Final  . GFR calc non Af Amer 01/12/2018 >60  >60 mL/min Final  . GFR calc Af Amer 01/12/2018 >60  >60 mL/min Final   Comment: (NOTE) The eGFR has been calculated using the CKD EPI equation. This calculation has not been validated in all clinical situations. eGFR's persistently <60 mL/min signify possible Chronic Kidney Disease.   Georgiann Hahn gap 01/12/2018 9  5 - 15 Final    Performed at [facility], [address], [place], [place] [address]  . Kappa free light chain 01/12/2018 56.5* 3.3 - 19.4 mg/L Final  . Lamda free light chains 01/12/2018 39.0* 5.7 - 26.3 mg/L Final  . Kappa, lamda light chain ratio 01/12/2018 1.45  0.26 - 1.65 Final   Comment: (NOTE) Performed At: [facility] [place], [place] [id number] Rush Farmer MD [id number] Performed at [facility], [address], [place], [place] [address]   . IgG (Immunoglobin G), Serum 01/12/2018 1,090  700 - 1,600 mg/dL Final  . IgA 01/12/2018 434  61 - 437 mg/dL Final  . IgM (Immunoglobulin M), Srm 01/12/2018 45  15 - 143 mg/dL Final   Comment: (NOTE) Performed At: [facility] [place], [place] [id number] Rush Farmer MD [id number] Performed at [facility], [address], [place], [place] [address]   . Total Protein ELP 01/12/2018 6.5  6.0 - 8.5 g/dL Final  . IgG (Immunoglobin G), Serum 01/12/2018 1,081  700 - 1,600 mg/dL Final  . IgA 01/12/2018 428  61 - 437 mg/dL Final  . IgM (Immunoglobulin M), Srm 01/12/2018 42  15 - 143 mg/dL Final   Comment: (NOTE) Performed At: [facility] [place], [place] [id number] Rush Farmer MD OF:[id number]   . Immunofixation Result, Serum 01/12/2018 Comment   Corrected   Comment: (NOTE) An apparent polyclonal gammopathy: IgA. Kappa and lambda typing appear increased. Performed at [facility], [address] [place], [place] [address]   . Total Protein ELP 01/12/2018 6.4  6.0 - 8.5 g/dL Final  . Albumin ELP 01/12/2018 3.4  2.9 - 4.4 g/dL Final  . Alpha-1-Globulin 01/12/2018 0.2  0.0 - 0.4 g/dL Final  . Alpha-2-Globulin 01/12/2018 0.7  0.4 - 1.0 g/dL Final  . Beta Globulin 01/12/2018 1.0  0.7 - 1.3 g/dL Final  . Gamma Globulin 01/12/2018 1.1  0.4 - 1.8 g/dL Final  . M-Spike, % 01/12/2018 Not Observed  Not Observed g/dL Final  . SPE Interp. 01/12/2018 Comment   Final   Comment:  (NOTE) The SPE pattern appears essentially unremarkable. Evidence of monoclonal protein is not apparent. Performed At: [facility] [place], [place] [id number] Rush Farmer MD [id number]   . Comment 01/12/2018 Comment   Final   Comment: (NOTE) Protein electrophoresis scan will follow via computer, mail, or courier delivery.   Marland Kitchen GLOBULIN, TOTAL 01/12/2018 3.0  2.2 - 3.9 g/dL Corrected  . A/G Ratio 01/12/2018 1.1  0.7 - 1.7 Corrected   Performed at [facility], [address] [place], [place] [address]  . LDH 01/12/2018 145  98 - 192 U/L Final   Performed at [facility], [address] [place], [place] [address]     Pathology Orders Placed This Encounter  Procedures  . DG Bone Survey Met    Standing Status:   Future    Number of Occurrences:   1  Standing Expiration Date:   03/15/2019    Order Specific Question:   Reason for Exam (SYMPTOM  OR DIAGNOSIS REQUIRED)    Answer:   myeloma, bone pain    Order Specific Question:   Preferred imaging location?    Answer:   [facility]    Order Specific Question:   Radiology Contrast Protocol - do NOT remove file path    Answer:   [email or web address]  . CBC with Differential/Platelet    Standing Status:   Future    Standing Expiration Date:   01/13/2019  . Comprehensive metabolic panel    Standing Status:   Future    Standing Expiration Date:   01/13/2019  . Lactate dehydrogenase    Standing Status:   Future    Standing Expiration Date:   01/13/2019  . Protein electrophoresis, serum    Standing Status:   Future    Standing Expiration Date:   01/13/2019       Zoila Shutter MD

## 2018-02-06 ENCOUNTER — Other Ambulatory Visit (HOSPITAL_COMMUNITY): Payer: Self-pay | Admitting: *Deleted

## 2018-02-06 DIAGNOSIS — C9001 Multiple myeloma in remission: Secondary | ICD-10-CM

## 2018-02-06 MED ORDER — LENALIDOMIDE 10 MG PO CAPS: ORAL_CAPSULE | ORAL | 0 refills | Status: DC

## 2018-02-09 ENCOUNTER — Inpatient Hospital Stay (HOSPITAL_COMMUNITY): Payer: Medicare Other

## 2018-02-09 ENCOUNTER — Inpatient Hospital Stay (HOSPITAL_COMMUNITY): Payer: Medicare Other | Attending: Hematology | Admitting: Hematology

## 2018-02-09 ENCOUNTER — Encounter (HOSPITAL_COMMUNITY): Payer: Self-pay | Admitting: Hematology

## 2018-02-09 ENCOUNTER — Other Ambulatory Visit: Payer: Self-pay

## 2018-02-09 VITALS — BP 138/71 | HR 59 | Temp 97.8°F | Resp 18

## 2018-02-09 VITALS — BP 135/71 | HR 65 | Temp 97.7°F | Resp 20 | Wt 189.0 lb

## 2018-02-09 DIAGNOSIS — E538 Deficiency of other specified B group vitamins: Secondary | ICD-10-CM

## 2018-02-09 DIAGNOSIS — C9001 Multiple myeloma in remission: Secondary | ICD-10-CM | POA: Diagnosis not present

## 2018-02-09 DIAGNOSIS — D801 Nonfamilial hypogammaglobulinemia: Secondary | ICD-10-CM | POA: Insufficient documentation

## 2018-02-09 DIAGNOSIS — Z8572 Personal history of non-Hodgkin lymphomas: Secondary | ICD-10-CM

## 2018-02-09 LAB — COMPREHENSIVE METABOLIC PANEL
ALBUMIN: 3.5 g/dL (ref 3.5–5.0)
ALT: 17 U/L (ref 17–63)
ANION GAP: 7 (ref 5–15)
AST: 26 U/L (ref 15–41)
Alkaline Phosphatase: 80 U/L (ref 38–126)
BUN: 7 mg/dL (ref 6–20)
CHLORIDE: 108 mmol/L (ref 101–111)
CO2: 23 mmol/L (ref 22–32)
Calcium: 9.1 mg/dL (ref 8.9–10.3)
Creatinine, Ser: 0.9 mg/dL (ref 0.61–1.24)
GFR calc Af Amer: >60 mL/min (ref >60)
Glucose, Bld: 100 mg/dL — ABNORMAL HIGH (ref 65–99)
POTASSIUM: 3.9 mmol/L (ref 3.5–5.1)
Sodium: 138 mmol/L (ref 135–145)
TOTAL PROTEIN: 6.7 g/dL (ref 6.5–8.1)
Total Bilirubin: 0.6 mg/dL (ref 0.3–1.2)

## 2018-02-09 LAB — CBC WITH DIFFERENTIAL/PLATELET
BASOS ABS: 0 10*3/uL (ref 0.0–0.1)
BASOS PCT: 0 %
EOS PCT: 1 %
Eosinophils Absolute: 0.1 10*3/uL (ref 0.0–0.7)
HCT: 36.9 % — ABNORMAL LOW (ref 39.0–52.0)
Hemoglobin: 11.6 g/dL — ABNORMAL LOW (ref 13.0–17.0)
Lymphocytes Relative: 68 %
Lymphs Abs: 3.4 10*3/uL (ref 0.7–4.0)
MCH: 27.8 pg (ref 26.0–34.0)
MCHC: 31.4 g/dL (ref 30.0–36.0)
MCV: 88.3 fL (ref 78.0–100.0)
MONOS PCT: 7 %
Monocytes Absolute: 0.4 10*3/uL (ref 0.1–1.0)
Neutro Abs: 1.2 10*3/uL — ABNORMAL LOW (ref 1.7–7.7)
Neutrophils Relative %: 24 %
PLATELETS: 106 10*3/uL — AB (ref 150–400)
RBC: 4.18 MIL/uL — ABNORMAL LOW (ref 4.22–5.81)
RDW: 16 % — AB (ref 11.5–15.5)
WBC: 5 10*3/uL (ref 4.0–10.5)

## 2018-02-09 LAB — LACTATE DEHYDROGENASE: LDH: 142 U/L (ref 98–192)

## 2018-02-09 MED ORDER — SODIUM CHLORIDE 0.9 % IJ SOLN: 3.0000 mL | Freq: Once | INTRAMUSCULAR | Status: DC | PRN

## 2018-02-09 MED ORDER — IMMUNE GLOBULIN (HUMAN) 10 GM/100ML IV SOLN
400.0000 mg/kg | Freq: Once | INTRAVENOUS | Status: AC
  Administered 2018-02-09: 35 g via INTRAVENOUS
  Filled 2018-02-09: qty 200

## 2018-02-09 MED ORDER — SODIUM CHLORIDE 0.9 % IV SOLN
Freq: Once | INTRAVENOUS | Status: DC
Start: 2018-02-09 — End: 2018-02-09

## 2018-02-09 MED ORDER — HEPARIN SOD (PORK) LOCK FLUSH 100 UNIT/ML IV SOLN
500.0000 [IU] | Freq: Once | INTRAVENOUS | Status: AC | PRN
  Administered 2018-02-09: 500 [IU]

## 2018-02-09 MED ORDER — ACETAMINOPHEN 325 MG PO TABS
650.0000 mg | ORAL_TABLET | Freq: Once | ORAL | Status: AC
  Administered 2018-02-09: 650 mg via ORAL

## 2018-02-09 MED ORDER — ACETAMINOPHEN 325 MG PO TABS
ORAL_TABLET | ORAL | Status: AC
  Filled 2018-02-09: qty 2

## 2018-02-09 MED ORDER — ALTEPLASE 2 MG IJ SOLR: 2.0000 mg | Freq: Once | INTRAMUSCULAR | Status: DC | PRN

## 2018-02-09 MED ORDER — DEXTROSE 5 % IV SOLN
INTRAVENOUS | Status: DC
  Administered 2018-02-09: 10:00:00 via INTRAVENOUS

## 2018-02-09 MED ORDER — CYANOCOBALAMIN 1000 MCG/ML IJ SOLN
INTRAMUSCULAR | Status: AC
  Filled 2018-02-09: qty 1

## 2018-02-09 MED ORDER — CYANOCOBALAMIN 1000 MCG/ML IJ SOLN
1000.0000 ug | Freq: Once | INTRAMUSCULAR | Status: AC
  Administered 2018-02-09: 1000 ug via INTRAMUSCULAR

## 2018-02-09 MED ORDER — HEPARIN SOD (PORK) LOCK FLUSH 100 UNIT/ML IV SOLN
250.0000 [IU] | Freq: Once | INTRAVENOUS | Status: DC | PRN
  Filled 2018-02-09: qty 5

## 2018-02-09 MED ORDER — SODIUM CHLORIDE 0.9 % IJ SOLN: 10.0000 mL | INTRAMUSCULAR | Status: DC | PRN

## 2018-02-09 NOTE — Assessment & Plan Note (Signed)
1.  IgG lambda multiple myeloma: He continues to be in remission at this time.  He complains of feeling more tired in the last 1-2 months. -Status post auto stem cell transplant at [facility] on 08/24/2009 -On Revlimid maintenance 10 mg 7 days on/7 days off since 2011, well-tolerated except some diarrhea.  He reports that he is taking up to 6 antidiarrhea pills a day.  We discussed the option of cutting back on Revlimid dose to 5 mg 7 days on 7 days of for his tiredness.  He does not want to cut it back at this time. - His SPEP, immunofixation and light chain ratio are within normal limits, skeletal survey on 01/12/2018 negative for lytic lesions  2.  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  His trough immunoglobulin level was 1081.  He does not have any infections or hospitalizations in the last 6 months.  He will continue monthly infusions.  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  His physical examination did not reveal any adenopathy or splenomegaly.  LDH is 142.  4.  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

## 2018-02-09 NOTE — Progress Notes (Signed)
Treatment given per orders. Patient tolerated it well without problems. Vitals stable and discharged home from clinic ambulatory. Follow up as scheduled.  

## 2018-02-09 NOTE — Progress Notes (Signed)
Jeremy Parrish, [place] [id number]   CLINIC:  Medical Oncology/Hematology  PCP:  Celene Squibb, MD [place] [address] [phone]   REASON FOR VISIT:  Follow-up for hypogammaglobulinemia and multiple myeloma  CURRENT THERAPY: IVIG monthly, Revlimid 7 days on 7 days off   INTERVAL HISTORY:  [name] 77 y.o. male returns for routine follow-up of his hypogammaglobulinemia and multiple myeloma.  He is taking Revlimid 10 mg 7 days on, 7 days off.  He complains of worsening tiredness in the last 1-2 months.  He is also complaining of diarrhea and takes about 6 antidiarrheal pills.  He does not know whether there Imodium or Lomotil.  The diarrhea has been chronic.  The consistency is watery to soft.  The pills are helping.  He denies any fevers or infections or hospitalizations in the last 6 months.  No weight loss was reported.  No peripheral neuropathy symptoms.  No new onset pains were reported.  REVIEW OF SYSTEMS:  Review of Systems  Constitutional: Negative.   HENT:  Negative.   Respiratory: Positive for shortness of breath.   Cardiovascular: Negative.   Gastrointestinal: Positive for diarrhea.  Genitourinary: Negative.    Musculoskeletal: Negative.   Skin: Negative.   Neurological: Negative.   Hematological: Negative.   Psychiatric/Behavioral: Negative.      PAST MEDICAL/SURGICAL HISTORY:  Past Medical History:  Diagnosis Date  . Anemia   . Aortic aneurysm of unspecified site without mention of rupture   . Arthritis   . Bladder neck contracture   . Cancer ([place])   . Cerebral atherosclerosis    Carotid Doppler, 02/16/2013 - Bilateral Proximal ICAs,demonstrate mild plaque w/o evidence of significant diameter reduction, dissection, or any other vascular abnormality  . CHF (congestive heart failure) ([place])   . Complication of anesthesia   . COPD (chronic obstructive pulmonary disease) ([place])   . Coronary artery disease     . Depression   . Esophageal reflux   . Heart disease   . Heart murmur   . Hx of bladder cancer 10/07/2012  . Hyperlipidemia   . Hypertension   . Hypogammaglobulinemia ([place]) 09/28/2012   Secondary to Lymphoma and Multiple Myeloma and their treatments  . Intestinovesical fistula   . Kidney stones    history  . Lung mass   . Multiple myeloma   . Myocardial infarction [facility])    '96  . Non Hodgkin's lymphoma ([place])   . Paroxysmal atrial fibrillation ([place]) 01/02/2016  . Peripheral arterial disease ([place])   . Personal history of other diseases of circulatory system   . PONV (postoperative nausea and vomiting)   . Prostate cancer ([place]) 2000  . Shingles   . Shortness of breath   . Sleep apnea    05-02-14 cpap , not yet used- suggested settings 5  . Stroke [facility]) 2013   Speech.   Past Surgical History:  Procedure Laterality Date  . BLADDER SURGERY    . BONE MARROW TRANSPLANT  2011  . COLON SURGERY     colon resection  . COLONOSCOPY N/A 01/01/2013   Procedure: COLONOSCOPY;  Surgeon: Rogene Houston, MD;  Location: [facility] ENDO SUITE;  Service: Endoscopy;  Laterality: N/A;  825-moved to [place] notified pt  . CORONARY ANGIOPLASTY  06/24/2000   PCI and stenting in mid & proximal RCA  . heart stents x 5  1999  . INGUINAL HERNIA REPAIR Right 05/04/2014   Procedure: OPEN  RIGHT INGUINAL HERNIA REPAIR with mesh;  Surgeon: Edward Jolly, MD;  Location: [facility] ORS;  Service: General;  Laterality: Right;  . INSERT / REPLACE / REMOVE PACEMAKER    . left ear skin cancer removed    . NM MYOCAR PERF WALL MOTION  11/27/2007   inferior scar  . PACEMAKER INSERTION  07/22/2011   [employer]  . PORTACATH PLACEMENT  07/26/2009   right chest  . PROSTATE SURGERY    . Rotator    . ROTATOR CUFF REPAIR Right   . SHOULDER ARTHROSCOPY WITH SUBACROMIAL DECOMPRESSION Right 07/21/2013   Procedure: RIGHT SHOULDER ARTHROSCOPY WITH SUBACROMIAL DECOMPRESSION AND DEBRIDEMENT & Injection of Left Shoulder;  Surgeon: Alta Corning, MD;  Location: [place];  Service: Orthopedics;  Laterality: Right;  . TEE WITHOUT CARDIOVERSION  10/13/2012   Procedure: TRANSESOPHAGEAL ECHOCARDIOGRAM (TEE);  Surgeon: Sanda Klein, MD;  Location: [facility] ENDOSCOPY;  Service: Cardiovascular;  Laterality: N/A;  pat/kay/echo notified  . US ECHOCARDIOGRAPHY  06/19/2011   RV mildly dilated,mild to mod. MR,mild AI,mild PI  . WRIST SURGERY     right     SOCIAL HISTORY:  Social History   Socioeconomic History  . Marital status: Married    Spouse name: [name]  . Number of children: 3  . Years of education: 9th  . Highest education level: Not on file  Occupational History  . Occupation: retired   Scientific laboratory technician  . Financial resource strain: Not on file  . Food insecurity:    Worry: Not on file    Inability: Not on file  . Transportation needs:    Medical: Not on file    Non-medical: Not on file  Tobacco Use  . Smoking status: Former Smoker    Packs/day: 1.00    Years: 20.00    Pack years: 20.00    Types: Cigarettes    Last attempt to quit: 11/14/1994    Years since quitting: 23.2  . Smokeless tobacco: Never Used  Substance and Sexual Activity  . Alcohol use: No    Alcohol/week: 0.0 oz    Comment: previously drank but none for at least 15 years.  . Drug use: No  . Sexual activity: Not on file  Lifestyle  . Physical activity:    Days per week: Not on file    Minutes per session: Not on file  . Stress: Not on file  Relationships  . Social connections:    Talks on phone: Not on file    Gets together: Not on file    Attends religious service: Not on file    Active member of club or organization: Not on file    Attends meetings of clubs or organizations: Not on file    Relationship status: Not on file  . Intimate partner violence:    Fear of current or ex partner: Not on file    Emotionally abused: Not on file    Physically abused: Not on file    Forced sexual activity: Not on file  Other Topics Concern  . Not on file    Social History Narrative   Patient lives at home spouse.   Caffeine Use: Occasionally    FAMILY HISTORY:  Family History  Problem Relation Age of Onset  . Cancer Father        bladder  . Heart disease Father        before age 105  . Hypertension Mother   . Cancer Brother   . Heart disease Brother  before age 37  . Heart disease Sister        before age 24  . Hyperlipidemia Sister   . Hypertension Sister   . Heart attack Sister   . Colon cancer Neg Hx   . Colon polyps Neg Hx     CURRENT MEDICATIONS:  Outpatient Encounter Medications as of 02/09/2018  Medication Sig Note  . albuterol (PROVENTIL HFA;VENTOLIN HFA) 108 (90 Base) MCG/ACT inhaler Inhale 2 puffs into the lungs every 6 (six) hours as needed for wheezing or shortness of breath.   Marland Kitchen albuterol (PROVENTIL) (2.5 MG/3ML) 0.083% nebulizer solution Use via neb q 4 hours prn wheezing   . ALPRAZolam (XANAX) 0.5 MG tablet Take 1-2 tablets (0.5-1 mg total) by mouth at bedtime as needed for anxiety.   Marland Kitchen amLODipine (NORVASC) 10 MG tablet TAKE 1 TABLET (10 MG TOTAL) BY MOUTH EVERY MORNING.   . budesonide (PULMICORT) 0.5 MG/2ML nebulizer solution Take 2 mLs (0.5 mg total) by nebulization 2 (two) times daily. (Patient taking differently: Take 0.5 mg by nebulization daily as needed (for shortness of breath). )   . citalopram (CELEXA) 40 MG tablet TAKE 1 TABLET (40 MG TOTAL) BY MOUTH DAILY.   Marland Kitchen clopidogrel (PLAVIX) 75 MG tablet TAKE 1 TABLET BY MOUTH EVERY DAY   . diclofenac sodium (VOLTAREN) 1 % GEL Apply 2 g topically 4 (four) times daily as needed.   Marland Kitchen ELIQUIS 5 MG TABS tablet TAKE 1 TABLET BY MOUTH TWICE A DAY   . furosemide (LASIX) 20 MG tablet Use sparingly as needed for edema. No more than one tablet by mouth per week.   Marland Kitchen HYDROcodone-acetaminophen (NORCO/VICODIN) 5-325 MG tablet Take 1 tablet by mouth every 6 (six) hours as needed.   Marland Kitchen HYDROcodone-homatropine (HYCODAN) 5-1.5 MG/5ML syrup Take one tsp qhs prn cough   .  KLOR-CON M20 20 MEQ tablet TAKE 1 TABLET BY MOUTH 3 TIMES A DAY   . lenalidomide (REVLIMID) 10 MG capsule TAKE 1 CAPSULE BY MOUTH DAILY FOR 7 DAYS ON FOLLOWED BY 7 DAYS OFF. THEN REPEAT CYCLE.   Marland Kitchen levocetirizine (XYZAL) 5 MG tablet Take 5 mg by mouth daily as needed for allergies.    Marland Kitchen LYRICA 75 MG capsule  07/04/2016: Received from: External Pharmacy  . nitroGLYCERIN (NITROSTAT) 0.4 MG SL tablet Place 1 tablet (0.4 mg total) under the tongue every 5 (five) minutes as needed for chest pain.   . simvastatin (ZOCOR) 20 MG tablet TAKE 1 TABLET BY MOUTH EVERYDAY AT BEDTIME    Facility-Administered Encounter Medications as of 02/09/2018  Medication  . acetaminophen (TYLENOL) tablet 650 mg  . sodium chloride 0.9 % injection 10 mL    ALLERGIES:  Allergies  Allergen Reactions  . Morphine And Related Other (See Comments)    hallucinations  . Tape Rash    Paper tape is ok     PHYSICAL EXAM:  ECOG Performance status: 1  Vitals:   02/09/18 0850  BP: 135/71  Pulse: 65  Resp: 20  Temp: 97.7 F (36.5 C)  SpO2: 99%   Filed Weights   02/09/18 0850  Weight: 189 lb (85.7 kg)    Physical Exam  Constitutional: He is well-developed, well-nourished, and in no distress.  Cardiovascular: Normal rate, regular rhythm and normal heart sounds.  Pulmonary/Chest: Effort normal and breath sounds normal.  Abdominal: Soft. Bowel sounds are normal. He exhibits no mass. There is no tenderness.  Psychiatric: Mood, memory, affect and judgment normal.     LABORATORY DATA:  I have reviewed the labs as listed.  CBC    Component Value Date/Time   WBC 5.0 02/09/2018 0817   RBC 4.18 (L) 02/09/2018 0817   HGB 11.6 (L) 02/09/2018 0817   HCT 36.9 (L) 02/09/2018 0817   PLT 106 (L) 02/09/2018 0817   MCV 88.3 02/09/2018 0817   MCH 27.8 02/09/2018 0817   MCHC 31.4 02/09/2018 0817   RDW 16.0 (H) 02/09/2018 0817   LYMPHSABS 3.4 02/09/2018 0817   MONOABS 0.4 02/09/2018 0817   EOSABS 0.1 02/09/2018 0817    BASOSABS 0.0 02/09/2018 0817   CMP Latest Ref Rng & Units 02/09/2018 01/12/2018 12/15/2017  Glucose 65 - 99 mg/dL 100(H) 102(H) 107(H)  BUN 6 - 20 mg/dL _0 Creatinine 0.61 - 1.24 mg/dL 0.90 0.83 1.15  Sodium 135 - 145 mmol/L 138 141 137  Potassium 3.5 - 5.1 mmol/L 3.9 4.2 4.4  Chloride 101 - 111 mmol/L 108 109 102  CO2 22 - 32 mmol/L _1 Calcium 8.9 - 10.3 mg/dL 9.1 9.1 9.0  Total Protein 6.5 - 8.1 g/dL 6.7 6.7 7.1  Total Bilirubin 0.3 - 1.2 mg/dL 0.6 0.3 0.6  Alkaline Phos 38 - 126 U/L 80 84 90  AST 15 - 41 U/L _2 ALT 17 - 63 U/L 17 12(L) 20       DIAGNOSTIC IMAGING:  I have personally reviewed his metastatic bone survey dated 01/12/2018 which was negative for any lytic lesions.    ASSESSMENT & PLAN:   Multiple myeloma in remission 1.  IgG lambda multiple myeloma: He continues to be in remission at this time.  He complains of feeling more tired in the last 1-2 months. 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  He reports that he is taking up to 6 antidiarrhea pills a day.  We discussed the option of cutting back on Revlimid dose to 5 mg 7 days on 7 days of for his tiredness.  He does not want to cut it back at this time. - His SPEP, immunofixation and light chain ratio are within normal limits, skeletal survey on 01/12/2018 negative for lytic lesions  2.  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  His trough immunoglobulin level was 1081.  He does not have any infections or hospitalizations in the last 6 months.  He will continue monthly infusions.  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  His physical examination did not reveal any adenopathy or splenomegaly.  LDH is 142.  4.  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      Orders placed this encounter:  No orders  of the defined types were placed in this encounter.     Derek Jack, MD [place] [phone]

## 2018-02-10 DIAGNOSIS — J449 Chronic obstructive pulmonary disease, unspecified: Secondary | ICD-10-CM | POA: Diagnosis not present

## 2018-02-10 DIAGNOSIS — Z6829 Body mass index (BMI) 29.0-29.9, adult: Secondary | ICD-10-CM | POA: Diagnosis not present

## 2018-02-10 LAB — PROTEIN ELECTROPHORESIS, SERUM
A/G RATIO SPE: 1.1 (ref 0.7–1.7)
ALBUMIN ELP: 3.3 g/dL (ref 2.9–4.4)
ALPHA-1-GLOBULIN: 0.2 g/dL (ref 0.0–0.4)
ALPHA-2-GLOBULIN: 0.7 g/dL (ref 0.4–1.0)
Beta Globulin: 0.9 g/dL (ref 0.7–1.3)
GLOBULIN, TOTAL: 2.9 g/dL (ref 2.2–3.9)
Gamma Globulin: 1.1 g/dL (ref 0.4–1.8)
Total Protein ELP: 6.2 g/dL (ref 6.0–8.5)

## 2018-02-10 LAB — IGG, IGA, IGM
IGA: 418 mg/dL (ref 61–437)
IGG (IMMUNOGLOBIN G), SERUM: 1085 mg/dL (ref 700–1600)
IGM (IMMUNOGLOBULIN M), SRM: 36 mg/dL (ref 15–143)

## 2018-02-10 LAB — KAPPA/LAMBDA LIGHT CHAINS
KAPPA, LAMDA LIGHT CHAIN RATIO: 1.55 (ref 0.26–1.65)
Kappa free light chain: 56.4 mg/L — ABNORMAL HIGH (ref 3.3–19.4)
Lambda free light chains: 36.3 mg/L — ABNORMAL HIGH (ref 5.7–26.3)

## 2018-02-10 LAB — IMMUNOFIXATION ELECTROPHORESIS
IgA: 428 mg/dL (ref 61–437)
IgG (Immunoglobin G), Serum: 1151 mg/dL (ref 700–1600)
IgM (Immunoglobulin M), Srm: 32 mg/dL (ref 15–143)
Total Protein ELP: 6.5 g/dL (ref 6.0–8.5)

## 2018-02-27 ENCOUNTER — Other Ambulatory Visit: Payer: Self-pay | Admitting: Cardiovascular Disease

## 2018-03-07 DIAGNOSIS — J302 Other seasonal allergic rhinitis: Secondary | ICD-10-CM | POA: Diagnosis not present

## 2018-03-07 DIAGNOSIS — E782 Mixed hyperlipidemia: Secondary | ICD-10-CM | POA: Diagnosis not present

## 2018-03-07 DIAGNOSIS — W19XXXA Unspecified fall, initial encounter: Secondary | ICD-10-CM | POA: Diagnosis not present

## 2018-03-07 DIAGNOSIS — R0602 Shortness of breath: Secondary | ICD-10-CM | POA: Diagnosis not present

## 2018-03-07 DIAGNOSIS — J449 Chronic obstructive pulmonary disease, unspecified: Secondary | ICD-10-CM | POA: Diagnosis not present

## 2018-03-07 DIAGNOSIS — I10 Essential (primary) hypertension: Secondary | ICD-10-CM | POA: Diagnosis not present

## 2018-03-07 DIAGNOSIS — Z8673 Personal history of transient ischemic attack (TIA), and cerebral infarction without residual deficits: Secondary | ICD-10-CM | POA: Diagnosis not present

## 2018-03-07 DIAGNOSIS — Z7901 Long term (current) use of anticoagulants: Secondary | ICD-10-CM | POA: Diagnosis not present

## 2018-03-07 DIAGNOSIS — R0782 Intercostal pain: Secondary | ICD-10-CM | POA: Diagnosis not present

## 2018-03-07 DIAGNOSIS — Z6831 Body mass index (BMI) 31.0-31.9, adult: Secondary | ICD-10-CM | POA: Diagnosis not present

## 2018-03-07 DIAGNOSIS — Z6830 Body mass index (BMI) 30.0-30.9, adult: Secondary | ICD-10-CM | POA: Diagnosis not present

## 2018-03-07 DIAGNOSIS — Z6829 Body mass index (BMI) 29.0-29.9, adult: Secondary | ICD-10-CM | POA: Diagnosis not present

## 2018-03-09 DIAGNOSIS — N281 Cyst of kidney, acquired: Secondary | ICD-10-CM | POA: Diagnosis not present

## 2018-03-09 DIAGNOSIS — C61 Malignant neoplasm of prostate: Secondary | ICD-10-CM | POA: Diagnosis not present

## 2018-03-09 DIAGNOSIS — N320 Bladder-neck obstruction: Secondary | ICD-10-CM | POA: Diagnosis not present

## 2018-03-10 DIAGNOSIS — J441 Chronic obstructive pulmonary disease with (acute) exacerbation: Secondary | ICD-10-CM | POA: Diagnosis not present

## 2018-03-10 DIAGNOSIS — Z6829 Body mass index (BMI) 29.0-29.9, adult: Secondary | ICD-10-CM | POA: Diagnosis not present

## 2018-03-11 ENCOUNTER — Other Ambulatory Visit (HOSPITAL_COMMUNITY): Payer: Self-pay | Admitting: Adult Health Nurse Practitioner

## 2018-03-11 ENCOUNTER — Other Ambulatory Visit: Payer: Self-pay

## 2018-03-11 ENCOUNTER — Emergency Department (HOSPITAL_COMMUNITY): Payer: Medicare Other

## 2018-03-11 ENCOUNTER — Ambulatory Visit (HOSPITAL_COMMUNITY)
Admission: RE | Admit: 2018-03-11 | Discharge: 2018-03-11 | Disposition: A | Discharge status: Home / Self Care | Payer: Medicare Other | Source: Ambulatory Visit | Attending: Adult Health Nurse Practitioner | Admitting: Adult Health Nurse Practitioner

## 2018-03-11 ENCOUNTER — Inpatient Hospital Stay (HOSPITAL_COMMUNITY): Payer: Medicare Other

## 2018-03-11 ENCOUNTER — Encounter (HOSPITAL_COMMUNITY): Payer: Self-pay

## 2018-03-11 ENCOUNTER — Encounter (HOSPITAL_COMMUNITY): Payer: Self-pay | Admitting: Emergency Medicine

## 2018-03-11 ENCOUNTER — Inpatient Hospital Stay (HOSPITAL_COMMUNITY)
Admission: EM | Admit: 2018-03-11 | Discharge: 2018-03-12 | DRG: 194 | Disposition: A | Discharge status: Home / Self Care | Payer: Medicare Other | Attending: Internal Medicine | Admitting: Internal Medicine

## 2018-03-11 ENCOUNTER — Inpatient Hospital Stay (HOSPITAL_COMMUNITY): Payer: Medicare Other | Attending: Hematology

## 2018-03-11 DIAGNOSIS — Z8673 Personal history of transient ischemic attack (TIA), and cerebral infarction without residual deficits: Secondary | ICD-10-CM | POA: Diagnosis not present

## 2018-03-11 DIAGNOSIS — Z8551 Personal history of malignant neoplasm of bladder: Secondary | ICD-10-CM

## 2018-03-11 DIAGNOSIS — F329 Major depressive disorder, single episode, unspecified: Secondary | ICD-10-CM | POA: Diagnosis present

## 2018-03-11 DIAGNOSIS — Z9481 Bone marrow transplant status: Secondary | ICD-10-CM | POA: Diagnosis not present

## 2018-03-11 DIAGNOSIS — D801 Nonfamilial hypogammaglobulinemia: Secondary | ICD-10-CM | POA: Insufficient documentation

## 2018-03-11 DIAGNOSIS — I509 Heart failure, unspecified: Secondary | ICD-10-CM | POA: Diagnosis present

## 2018-03-11 DIAGNOSIS — J441 Chronic obstructive pulmonary disease with (acute) exacerbation: Secondary | ICD-10-CM

## 2018-03-11 DIAGNOSIS — Z8052 Family history of malignant neoplasm of bladder: Secondary | ICD-10-CM

## 2018-03-11 DIAGNOSIS — J440 Chronic obstructive pulmonary disease with acute lower respiratory infection: Secondary | ICD-10-CM | POA: Diagnosis present

## 2018-03-11 DIAGNOSIS — Z7902 Long term (current) use of antithrombotics/antiplatelets: Secondary | ICD-10-CM

## 2018-03-11 DIAGNOSIS — I110 Hypertensive heart disease with heart failure: Secondary | ICD-10-CM | POA: Diagnosis present

## 2018-03-11 DIAGNOSIS — Z950 Presence of cardiac pacemaker: Secondary | ICD-10-CM

## 2018-03-11 DIAGNOSIS — Z8546 Personal history of malignant neoplasm of prostate: Secondary | ICD-10-CM | POA: Diagnosis not present

## 2018-03-11 DIAGNOSIS — Z8249 Family history of ischemic heart disease and other diseases of the circulatory system: Secondary | ICD-10-CM

## 2018-03-11 DIAGNOSIS — Z955 Presence of coronary angioplasty implant and graft: Secondary | ICD-10-CM | POA: Diagnosis not present

## 2018-03-11 DIAGNOSIS — J189 Pneumonia, unspecified organism: Secondary | ICD-10-CM | POA: Diagnosis not present

## 2018-03-11 DIAGNOSIS — Z7901 Long term (current) use of anticoagulants: Secondary | ICD-10-CM | POA: Diagnosis not present

## 2018-03-11 DIAGNOSIS — I252 Old myocardial infarction: Secondary | ICD-10-CM | POA: Diagnosis not present

## 2018-03-11 DIAGNOSIS — Z87891 Personal history of nicotine dependence: Secondary | ICD-10-CM

## 2018-03-11 DIAGNOSIS — C9001 Multiple myeloma in remission: Secondary | ICD-10-CM | POA: Diagnosis present

## 2018-03-11 DIAGNOSIS — Z8679 Personal history of other diseases of the circulatory system: Secondary | ICD-10-CM

## 2018-03-11 DIAGNOSIS — R509 Fever, unspecified: Secondary | ICD-10-CM

## 2018-03-11 DIAGNOSIS — I10 Essential (primary) hypertension: Secondary | ICD-10-CM | POA: Diagnosis present

## 2018-03-11 DIAGNOSIS — Z87442 Personal history of urinary calculi: Secondary | ICD-10-CM

## 2018-03-11 DIAGNOSIS — I495 Sick sinus syndrome: Secondary | ICD-10-CM | POA: Diagnosis present

## 2018-03-11 DIAGNOSIS — K219 Gastro-esophageal reflux disease without esophagitis: Secondary | ICD-10-CM | POA: Diagnosis present

## 2018-03-11 DIAGNOSIS — R0602 Shortness of breath: Secondary | ICD-10-CM | POA: Diagnosis not present

## 2018-03-11 DIAGNOSIS — Z8349 Family history of other endocrine, nutritional and metabolic diseases: Secondary | ICD-10-CM

## 2018-03-11 DIAGNOSIS — I480 Paroxysmal atrial fibrillation: Secondary | ICD-10-CM | POA: Diagnosis present

## 2018-03-11 DIAGNOSIS — Z8572 Personal history of non-Hodgkin lymphomas: Secondary | ICD-10-CM

## 2018-03-11 DIAGNOSIS — I2510 Atherosclerotic heart disease of native coronary artery without angina pectoris: Secondary | ICD-10-CM | POA: Diagnosis present

## 2018-03-11 DIAGNOSIS — E785 Hyperlipidemia, unspecified: Secondary | ICD-10-CM | POA: Diagnosis present

## 2018-03-11 DIAGNOSIS — R05 Cough: Secondary | ICD-10-CM | POA: Diagnosis not present

## 2018-03-11 LAB — CBC WITH DIFFERENTIAL/PLATELET
BASOS ABS: 0 10*3/uL (ref 0.0–0.1)
BASOS PCT: 0 %
Basophils Absolute: 0 10*3/uL (ref 0.0–0.1)
Basophils Relative: 0 %
EOS ABS: 0.1 10*3/uL (ref 0.0–0.7)
EOS PCT: 1 %
Eosinophils Absolute: 0 10*3/uL (ref 0.0–0.7)
Eosinophils Relative: 0 %
HCT: 36.7 % — ABNORMAL LOW (ref 39.0–52.0)
HCT: 36.5 % — ABNORMAL LOW (ref 39.0–52.0)
Hemoglobin: 11.5 g/dL — ABNORMAL LOW (ref 13.0–17.0)
Hemoglobin: 11.7 g/dL — ABNORMAL LOW (ref 13.0–17.0)
Lymphocytes Relative: 48 %
Lymphocytes Relative: 46 %
Lymphs Abs: 4.1 10*3/uL — ABNORMAL HIGH (ref 0.7–4.0)
Lymphs Abs: 4.1 10*3/uL — ABNORMAL HIGH (ref 0.7–4.0)
MCH: 27.4 pg (ref 26.0–34.0)
MCH: 27.9 pg (ref 26.0–34.0)
MCHC: 31.3 g/dL (ref 30.0–36.0)
MCHC: 32.1 g/dL (ref 30.0–36.0)
MCV: 87.4 fL (ref 78.0–100.0)
MCV: 86.9 fL (ref 78.0–100.0)
Monocytes Absolute: 0.7 10*3/uL (ref 0.1–1.0)
Monocytes Absolute: 0.8 10*3/uL (ref 0.1–1.0)
Monocytes Relative: 8 %
Monocytes Relative: 9 %
Neutro Abs: 3.6 10*3/uL (ref 1.7–7.7)
Neutro Abs: 4 10*3/uL (ref 1.7–7.7)
Neutrophils Relative %: 43 %
Neutrophils Relative %: 45 %
PLATELETS: 120 10*3/uL — AB (ref 150–400)
Platelets: 133 10*3/uL — ABNORMAL LOW (ref 150–400)
RBC: 4.2 MIL/uL — ABNORMAL LOW (ref 4.22–5.81)
RBC: 4.2 MIL/uL — ABNORMAL LOW (ref 4.22–5.81)
RDW: 16.2 % — ABNORMAL HIGH (ref 11.5–15.5)
RDW: 16.2 % — ABNORMAL HIGH (ref 11.5–15.5)
WBC: 8.5 10*3/uL (ref 4.0–10.5)
WBC: 8.9 10*3/uL (ref 4.0–10.5)

## 2018-03-11 LAB — BASIC METABOLIC PANEL
Anion gap: 11 (ref 5–15)
BUN: 8 mg/dL (ref 6–20)
CO2: 20 mmol/L — ABNORMAL LOW (ref 22–32)
Calcium: 9.1 mg/dL (ref 8.9–10.3)
Chloride: 104 mmol/L (ref 101–111)
Creatinine, Ser: 1.09 mg/dL (ref 0.61–1.24)
GFR calc Af Amer: >60 mL/min (ref >60)
GFR calc non Af Amer: >60 mL/min (ref >60)
Glucose, Bld: 109 mg/dL — ABNORMAL HIGH (ref 65–99)
Potassium: 4 mmol/L (ref 3.5–5.1)
Sodium: 135 mmol/L (ref 135–145)

## 2018-03-11 LAB — COMPREHENSIVE METABOLIC PANEL
ALBUMIN: 3.4 g/dL — AB (ref 3.5–5.0)
ALT: 19 U/L (ref 17–63)
AST: 32 U/L (ref 15–41)
Alkaline Phosphatase: 82 U/L (ref 38–126)
Anion gap: 11 (ref 5–15)
BUN: 8 mg/dL (ref 6–20)
CHLORIDE: 105 mmol/L (ref 101–111)
CO2: 19 mmol/L — ABNORMAL LOW (ref 22–32)
CREATININE: 1.02 mg/dL (ref 0.61–1.24)
Calcium: 8.9 mg/dL (ref 8.9–10.3)
GFR calc Af Amer: >60 mL/min (ref >60)
GLUCOSE: 151 mg/dL — AB (ref 65–99)
Potassium: 3.7 mmol/L (ref 3.5–5.1)
Sodium: 135 mmol/L (ref 135–145)
Total Bilirubin: 0.6 mg/dL (ref 0.3–1.2)
Total Protein: 7.2 g/dL (ref 6.5–8.1)

## 2018-03-11 LAB — LACTATE DEHYDROGENASE: LDH: 159 U/L (ref 98–192)

## 2018-03-11 LAB — INFLUENZA PANEL BY PCR (TYPE A & B)
INFLBPCR: NEGATIVE
Influenza A By PCR: NEGATIVE

## 2018-03-11 MED ORDER — SODIUM CHLORIDE 0.9% FLUSH
3.0000 mL | Freq: Two times a day (BID) | INTRAVENOUS | Status: DC
  Administered 2018-03-11 – 2018-03-12 (×2): 3 mL via INTRAVENOUS

## 2018-03-11 MED ORDER — AZITHROMYCIN 250 MG PO TABS
500.0000 mg | ORAL_TABLET | Freq: Once | ORAL | Status: AC
  Administered 2018-03-11: 500 mg via ORAL
  Filled 2018-03-11: qty 2

## 2018-03-11 MED ORDER — SODIUM CHLORIDE 0.9 % IV SOLN
1.0000 g | INTRAVENOUS | Status: DC
  Filled 2018-03-11: qty 10

## 2018-03-11 MED ORDER — FUROSEMIDE 20 MG PO TABS
20.0000 mg | ORAL_TABLET | Freq: Every day | ORAL | Status: DC
  Administered 2018-03-12: 20 mg via ORAL
  Filled 2018-03-11: qty 1

## 2018-03-11 MED ORDER — APIXABAN 5 MG PO TABS
5.0000 mg | ORAL_TABLET | Freq: Two times a day (BID) | ORAL | Status: DC
  Administered 2018-03-11 – 2018-03-12 (×2): 5 mg via ORAL
  Filled 2018-03-11 (×2): qty 1

## 2018-03-11 MED ORDER — ALBUTEROL (5 MG/ML) CONTINUOUS INHALATION SOLN
15.0000 mg/h | INHALATION_SOLUTION | RESPIRATORY_TRACT | Status: DC
  Administered 2018-03-11: 15 mg/h via RESPIRATORY_TRACT
  Filled 2018-03-11: qty 20

## 2018-03-11 MED ORDER — IPRATROPIUM-ALBUTEROL 0.5-2.5 (3) MG/3ML IN SOLN
3.0000 mL | Freq: Four times a day (QID) | RESPIRATORY_TRACT | Status: DC
  Administered 2018-03-11 – 2018-03-12 (×3): 3 mL via RESPIRATORY_TRACT
  Filled 2018-03-11 (×3): qty 3

## 2018-03-11 MED ORDER — SODIUM CHLORIDE 0.9 % IV SOLN
1.0000 g | Freq: Once | INTRAVENOUS | Status: AC
  Administered 2018-03-11: 1 g via INTRAVENOUS
  Filled 2018-03-11: qty 10

## 2018-03-11 MED ORDER — IPRATROPIUM BROMIDE 0.02 % IN SOLN
0.5000 mg | Freq: Once | RESPIRATORY_TRACT | Status: AC
  Administered 2018-03-11: 0.5 mg via RESPIRATORY_TRACT
  Filled 2018-03-11: qty 2.5

## 2018-03-11 MED ORDER — ALBUTEROL (5 MG/ML) CONTINUOUS INHALATION SOLN
INHALATION_SOLUTION | RESPIRATORY_TRACT | Status: AC
  Administered 2018-03-11: 17:00:00
  Filled 2018-03-11: qty 20

## 2018-03-11 MED ORDER — IPRATROPIUM BROMIDE 0.02 % IN SOLN
RESPIRATORY_TRACT | Status: AC
  Administered 2018-03-11: 0.5 mg
  Filled 2018-03-11: qty 2.5

## 2018-03-11 MED ORDER — METHYLPREDNISOLONE SODIUM SUCC 40 MG IJ SOLR
80.0000 mg | Freq: Once | INTRAMUSCULAR | Status: AC
  Administered 2018-03-11: 80 mg via INTRAVENOUS
  Filled 2018-03-11: qty 2

## 2018-03-11 MED ORDER — SODIUM CHLORIDE 0.9 % IV SOLN
500.0000 mg | INTRAVENOUS | Status: DC
  Filled 2018-03-11: qty 500

## 2018-03-11 MED ORDER — SODIUM CHLORIDE 0.9% FLUSH: 3.0000 mL | INTRAVENOUS | Status: DC | PRN

## 2018-03-11 MED ORDER — ALPRAZOLAM 0.5 MG PO TABS: 0.5000 mg | ORAL_TABLET | Freq: Every evening | ORAL | Status: DC | PRN

## 2018-03-11 MED ORDER — ALBUTEROL SULFATE (2.5 MG/3ML) 0.083% IN NEBU
5.0000 mg | INHALATION_SOLUTION | Freq: Once | RESPIRATORY_TRACT | Status: AC
  Administered 2018-03-11: 5 mg via RESPIRATORY_TRACT
  Filled 2018-03-11: qty 6

## 2018-03-11 MED ORDER — SODIUM CHLORIDE 0.9 % IV SOLN: 250.0000 mL | INTRAVENOUS | Status: DC | PRN

## 2018-03-11 MED ORDER — METHYLPREDNISOLONE SODIUM SUCC 40 MG IJ SOLR
INTRAMUSCULAR | Status: AC
  Filled 2018-03-11: qty 1

## 2018-03-11 MED ORDER — METHYLPREDNISOLONE SODIUM SUCC 125 MG IJ SOLR
80.0000 mg | Freq: Two times a day (BID) | INTRAMUSCULAR | Status: DC
  Administered 2018-03-12: 80 mg via INTRAVENOUS
  Filled 2018-03-11: qty 2

## 2018-03-11 MED ORDER — CLOPIDOGREL BISULFATE 75 MG PO TABS
75.0000 mg | ORAL_TABLET | Freq: Every day | ORAL | Status: DC
  Administered 2018-03-12: 75 mg via ORAL
  Filled 2018-03-11: qty 1

## 2018-03-11 MED ORDER — ALBUTEROL SULFATE (2.5 MG/3ML) 0.083% IN NEBU: 2.5000 mg | INHALATION_SOLUTION | RESPIRATORY_TRACT | Status: DC | PRN

## 2018-03-11 NOTE — H&P (Signed)
History and Physical    Jeremy Parrish GOT:[id number] DOB: December 20, 1940 DOA: 03/11/2018  PCP: Celene Squibb, MD  Patient coming from: Home  Chief Complaint: Coughing fever and shortness of breath  HPI: Jeremy Parrish is a 77 y.o. male with medical history significant of COPD, ex-smoker, CHF comes in with several days of coughing fever and not feeling well.  Patient went to see his primary care physician yesterday who started him on amoxicillin.  Patient continued to spike fever so came emergency department today.  Patient found to have bilateral pneumonia.  His O2 sats are normal.  He is slightly tachypneic.  Patient referred for admission for pneumonia with COPD exacerbation.  He has been wheezing a lot also over the last several days.  Review of Systems: As per HPI otherwise 10 point review of systems negative.   Past Medical History:  Diagnosis Date  . Anemia   . Aortic aneurysm of unspecified site without mention of rupture   . Arthritis   . Bladder neck contracture   . Cancer ([place])   . Cerebral atherosclerosis    Carotid Doppler, 02/16/2013 - Bilateral Proximal ICAs,demonstrate mild plaque w/o evidence of significant diameter reduction, dissection, or any other vascular abnormality  . CHF (congestive heart failure) ([place])   . Complication of anesthesia   . COPD (chronic obstructive pulmonary disease) ([place])   . Coronary artery disease   . Depression   . Esophageal reflux   . Heart disease   . Heart murmur   . Hx of bladder cancer 10/07/2012  . Hyperlipidemia   . Hypertension   . 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 Intestinovesical fistula   . Kidney stones    history  . Lung mass   . Multiple myeloma   . Myocardial infarction [facility])    '96  . Non Hodgkin's lymphoma ([place])   . Paroxysmal atrial fibrillation ([place]) 01/02/2016  . Peripheral arterial disease ([place])   . Personal history of other diseases of  circulatory system   . PONV (postoperative nausea and vomiting)   . Prostate cancer ([place]) 2000  . Shingles   . Shortness of breath   . Sleep apnea    05-02-14 cpap , not yet used- suggested settings 5  . Stroke [facility]) 2013   Speech.    Past Surgical History:  Procedure Laterality Date  . BLADDER SURGERY    . BONE MARROW TRANSPLANT  2011  . COLON SURGERY     colon resection  . COLONOSCOPY N/A 01/01/2013   Procedure: COLONOSCOPY;  Surgeon: Rogene Houston, MD;  Location: [facility] ENDO SUITE;  Service: Endoscopy;  Laterality: N/A;  825-moved to [place] notified pt  . CORONARY ANGIOPLASTY  06/24/2000   PCI and stenting in mid & proximal RCA  . heart stents x 5  1999  . INGUINAL HERNIA REPAIR Right 05/04/2014   Procedure: OPEN RIGHT INGUINAL HERNIA REPAIR with mesh;  Surgeon: Edward Jolly, MD;  Location: [facility] ORS;  Service: General;  Laterality: Right;  . INSERT / REPLACE / REMOVE PACEMAKER    . left ear skin cancer removed    . NM MYOCAR PERF WALL MOTION  11/27/2007   inferior scar  . PACEMAKER INSERTION  07/22/2011   Medtronic  . PORTACATH PLACEMENT  07/26/2009   right chest  . PROSTATE SURGERY    . Rotator    . ROTATOR CUFF REPAIR Right   . SHOULDER ARTHROSCOPY  WITH SUBACROMIAL DECOMPRESSION Right 07/21/2013   Procedure: RIGHT SHOULDER ARTHROSCOPY WITH SUBACROMIAL DECOMPRESSION AND DEBRIDEMENT & Injection of Left Shoulder;  Surgeon: Alta Corning, MD;  Location: [facility];  Service: Orthopedics;  Laterality: Right;  . TEE WITHOUT CARDIOVERSION  10/13/2012   Procedure: TRANSESOPHAGEAL ECHOCARDIOGRAM (TEE);  Surgeon: Sanda Klein, MD;  Location: [facility] ENDOSCOPY;  Service: Cardiovascular;  Laterality: N/A;  pat/kay/echo notified  . US ECHOCARDIOGRAPHY  06/19/2011   RV mildly dilated,mild to mod. MR,mild AI,mild PI  . WRIST SURGERY     right     reports that he quit smoking about 23 years ago. His smoking use included cigarettes. He has a 20.00 pack-year smoking history. He has never used  smokeless tobacco. He reports that he does not drink alcohol or use drugs.  Allergies  Allergen Reactions  . Morphine And Related Other (See Comments)    hallucinations  . Tape Rash    Paper tape is ok    Family History  Problem Relation Age of Onset  . Cancer Father        bladder  . Heart disease Father        before age 85  . Hypertension Mother   . Cancer Brother   . Heart disease Brother        before age 78  . Heart disease Sister        before age 60  . Hyperlipidemia Sister   . Hypertension Sister   . Heart attack Sister   . Colon cancer Neg Hx   . Colon polyps Neg Hx     Prior to Admission medications   Medication Sig Start Date End Date Taking? Authorizing Provider  albuterol (PROVENTIL HFA;VENTOLIN HFA) 108 (90 Base) MCG/ACT inhaler Inhale 2 puffs into the lungs every 6 (six) hours as needed for wheezing or shortness of breath. 11/17/17  Yes Mikey Kirschner, MD  albuterol (PROVENTIL) (2.5 MG/3ML) 0.083% nebulizer solution Use via neb q 4 hours prn wheezing 11/17/17  Yes Mikey Kirschner, MD  amLODipine (NORVASC) 10 MG tablet TAKE 1 TABLET (10 MG TOTAL) BY MOUTH EVERY MORNING. 11/05/17  Yes Mikey Kirschner, MD  azithromycin (ZITHROMAX) 500 MG tablet Take 500 mg by mouth daily. [place] ON 03/10/2018 03/10/18  Yes [provider]  citalopram (CELEXA) 40 MG tablet TAKE 1 TABLET (40 MG TOTAL) BY MOUTH DAILY. 11/05/17  Yes Mikey Kirschner, MD  clopidogrel (PLAVIX) 75 MG tablet TAKE 1 TABLET BY MOUTH EVERY DAY 01/12/18  Yes Croitoru, Mihai, MD  diclofenac sodium (VOLTAREN) 1 % GEL Apply 2 g topically 4 (four) times daily as needed. 11/05/17  Yes Mikey Kirschner, MD  ELIQUIS 5 MG TABS tablet TAKE 1 TABLET BY MOUTH TWICE A DAY 02/27/18  Yes Croitoru, Mihai, MD  furosemide (LASIX) 20 MG tablet Use sparingly as needed for edema. No more than one tablet by mouth per week. Patient taking differently: Take 20 mg by mouth daily.  07/29/17  Yes Lorretta Harp, MD  HYDROcodone-acetaminophen (NORCO) 7.5-325 MG tablet Take 1 tablet by mouth every 6 (six) hours as needed. for pain 02/10/18  Yes [provider]  KLOR-CON M20 20 MEQ tablet TAKE 1 TABLET BY MOUTH 3 TIMES A DAY 12/31/17  Yes Holley Bouche, NP  levocetirizine (XYZAL) 5 MG tablet Take 5 mg by mouth every evening.    Yes [provider]  mometasone (NASONEX) 50 MCG/ACT nasal spray SPRAY 1 SPRAY INTO EACH NOSTRIL TWICE  A DAY 03/07/18  Yes [provider]  nitroGLYCERIN (NITROSTAT) 0.4 MG SL tablet Place 1 tablet (0.4 mg total) under the tongue every 5 (five) minutes as needed for chest pain. 07/29/17  Yes Lorretta Harp, MD  omeprazole (PRILOSEC) 20 MG capsule Take 20 mg by mouth daily.   Yes [provider]  predniSONE (STERAPRED UNI-PAK 21 TAB) 10 MG (21) TBPK tablet Take 10-60 mg by mouth See admin instructions. TAKE AS DIRECTED PER PACKAGE INSTRUCTIONS (6,5,4,3,2,1) 03/10/18  Yes [provider]  simvastatin (ZOCOR) 20 MG tablet TAKE 1 TABLET BY MOUTH EVERYDAY AT BEDTIME 12/08/17  Yes Hochrein, Jeneen Rinks, MD  ALPRAZolam Duanne Moron) 0.5 MG tablet Take 1-2 tablets (0.5-1 mg total) by mouth at bedtime as needed for anxiety. 02/09/15   Baird Cancer, PA-C  lenalidomide (REVLIMID) 10 MG capsule TAKE 1 CAPSULE BY MOUTH DAILY FOR 7 DAYS ON FOLLOWED BY 7 DAYS OFF. THEN REPEAT CYCLE. 02/06/18   Zoila Shutter, MD    Physical Exam: Vitals:   03/11/18 1627 03/11/18 1647 03/11/18 1740 03/11/18 1834  BP:  (!) 118/95 (!) 118/95 95/60  Pulse: 82 81 98 86  Resp:  (!) 25 (!) 24 (!) 23  Temp:      TempSrc:      SpO2:  97% 96% 93%      Constitutional: NAD, calm, comfortable can speak in full sentences Vitals:   03/11/18 1627 03/11/18 1647 03/11/18 1740 03/11/18 1834  BP:  (!) 118/95 (!) 118/95 95/60  Pulse: 82 81 98 86  Resp:  (!) 25 (!) 24 (!) 23  Temp:      TempSrc:      SpO2:  97% 96% 93%   Eyes: PERRL, lids and conjunctivae normal ENMT: Mucous  membranes are moist. Posterior pharynx clear of any exudate or lesions.Normal dentition.  Neck: normal, supple, no masses, no thyromegaly Respiratory:bilateral wheezing, no crackles. Normal respiratory effort. No accessory muscle use.  Cardiovascular: Regular rate and rhythm, no murmurs / rubs / gallops. No extremity edema. 2+ pedal pulses. No carotid bruits.  Abdomen: no tenderness, no masses palpated. No hepatosplenomegaly. Bowel sounds positive.  Musculoskeletal: no clubbing / cyanosis. No joint deformity upper and lower extremities. Good ROM, no contractures. Normal muscle tone.  Skin: no rashes, lesions, ulcers. No induration Neurologic: CN 2-12 grossly intact. Sensation intact, DTR normal. Strength 5/5 in all 4.  Psychiatric: Normal judgment and insight. Alert and oriented x 3. Normal mood.    Labs on Admission: I have personally reviewed following labs and imaging studies  CBC: Recent Labs  Lab 03/11/18 0903 03/11/18 1534  WBC 8.5 8.9  NEUTROABS 3.6 4.0  HGB 11.5* 11.7*  HCT 36.7* 36.5*  MCV 87.4 86.9  PLT 120* 277*   Basic Metabolic Panel: Recent Labs  Lab 03/11/18 0903 03/11/18 1534  NA 135 135  K 3.7 4.0  CL 105 104  CO2 19* 20*  GLUCOSE 151* 109*  BUN 8 8  CREATININE 1.02 1.09  CALCIUM 8.9 9.1   GFR: Estimated Creatinine Clearance: 60.8 mL/min (by C-G formula based on SCr of 1.09 mg/dL). Liver Function Tests: Recent Labs  Lab 03/11/18 0903  AST 32  ALT 19  ALKPHOS 82  BILITOT 0.6  PROT 7.2  ALBUMIN 3.4*   No results for input(s): LIPASE, AMYLASE in the last 168 hours. No results for input(s): AMMONIA in the last 168 hours. Coagulation Profile: No results for input(s): INR, PROTIME in the last 168 hours. Cardiac Enzymes: No results for input(s):  CKTOTAL, CKMB, CKMBINDEX, TROPONINI in the last 168 hours. BNP (last 3 results) No results for input(s): PROBNP in the last 8760 hours. HbA1C: No results for input(s): HGBA1C in the last 72  hours. CBG: No results for input(s): GLUCAP in the last 168 hours. Lipid Profile: No results for input(s): CHOL, HDL, LDLCALC, TRIG, CHOLHDL, LDLDIRECT in the last 72 hours. Thyroid Function Tests: No results for input(s): TSH, T4TOTAL, FREET4, T3FREE, THYROIDAB in the last 72 hours. Anemia Panel: No results for input(s): VITAMINB12, FOLATE, FERRITIN, TIBC, IRON, RETICCTPCT in the last 72 hours. Urine analysis: No results found for: COLORURINE, APPEARANCEUR, LABSPEC, PHURINE, GLUCOSEU, HGBUR, BILIRUBINUR, KETONESUR, PROTEINUR, UROBILINOGEN, NITRITE, LEUKOCYTESUR Sepsis Labs: !!!!!!!!!!!!!!!!!!!!!!!!!!!!!!!!!!!!!!!!!!!! @LABRCNTIP (procalcitonin:4,lacticidven:4) )No results found for this or any previous visit (from the past 240 hour(s)).   Radiological Exams on Admission: Dg Chest 2 View  Result Date: 03/11/2018 CLINICAL DATA:  Cough, congestion and fever. History of prostate cancer, COPD and congestive heart failure. EXAM: CHEST - 2 VIEW COMPARISON:  Radiographs 01/12/2018 and 01/06/2018.  CT 04/24/2016. FINDINGS: The heart size and mediastinal contours are stable. There is aortic atherosclerosis, a left subclavian AV sequential pacemaker and a right IJ Port-A-Cath, the latter terminating at the superior cavoatrial junction. Coronary artery calcifications are present. There is central airway thickening with new patchy airspace opacities at both lung bases, best seen on the frontal examination. Peripheral densities overlying the left chest on the PA view are largely attributed to old rib fractures. No acute osseous findings or significant pleural effusion. IMPRESSION: New patchy bibasilar airspace opacities suspicious for early pneumonia. Followup PA and lateral chest X-ray is recommended in 3-4 weeks following trial of antibiotic therapy to ensure resolution and exclude underlying malignancy. Electronically Signed   By: Richardean Sale M.D.   On: 03/11/2018 16:16    Old chart reviewed  Chest  x-ray reviewed bilateral patchy infiltrates  Case discussed with EDP  Assessment/Plan 78 year old male with several days of increasing shortness of breath, wheezing, fever, cough found to have community acquired  pneumonia bilaterally  Principal Problem:   CAP (community acquired pneumonia)-placed on IV Rocephin and azithromycin.  Obtain blood cultures and sputum cultures.  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

## 2018-03-11 NOTE — ED Notes (Signed)
Notified Dr. Wilson Singer that patient looked like he was working harder to breathe. MD assessed pt and stated he does, but we are going to keep treating him the same.

## 2018-03-11 NOTE — ED Triage Notes (Signed)
Pt c/o sob x 4 days. Seen at urgent care Saturday and was given meds. Pt has some audible wheezing. Pt able to finished sentences. Sob noted. Denies pain. Clear prod cough

## 2018-03-11 NOTE — ED Notes (Signed)
Have paged respiratory  

## 2018-03-11 NOTE — ED Notes (Signed)
Pt dumped out medication in breathing treatment. Have paged respiratory

## 2018-03-11 NOTE — ED Provider Notes (Signed)
[facility] EMERGENCY DEPARTMENT Provider Note   CSN: [id number] Arrival date & time: 03/11/18  1523     History   Chief Complaint Chief Complaint  Patient presents with  . Shortness of Breath    HPI ABHIRAM CRIADO is a 77 y.o. male.  HPI   77 year old man with shortness of breath.  Symptom onset about 5 days ago.  He was seen in an urgent care facility as prescribed some medications taken without much improvement.  Seen by PCP yesterday prescribed amoxicillin.  Symptoms have progressed and today developed a fever.  Occasional nonproductive cough.  No unusual swelling.  Past Medical History:  Diagnosis Date  . Anemia   . Aortic aneurysm of unspecified site without mention of rupture   . Arthritis   . Bladder neck contracture   . Cancer ([place])   . Cerebral atherosclerosis    Carotid Doppler, 02/16/2013 - Bilateral Proximal ICAs,demonstrate mild plaque w/o evidence of significant diameter reduction, dissection, or any other vascular abnormality  . CHF (congestive heart failure) ([place])   . Complication of anesthesia   . COPD (chronic obstructive pulmonary disease) ([place])   . Coronary artery disease   . Depression   . Esophageal reflux   . Heart disease   . Heart murmur   . Hx of bladder cancer 10/07/2012  . Hyperlipidemia   . Hypertension   . 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 Intestinovesical fistula   . Kidney stones    history  . Lung mass   . Multiple myeloma   . Myocardial infarction [name])    '96  . Non Hodgkin's lymphoma ([place])   . Paroxysmal atrial fibrillation ([place]) 01/02/2016  . Peripheral arterial disease ([place])   . Personal history of other diseases of circulatory system   . PONV (postoperative nausea and vomiting)   . Prostate cancer ([place]) 2000  . Shingles   . Shortness of breath   . Sleep apnea    05-02-14 cpap , not yet used- suggested settings 5  . Stroke [facility]) 2013   Speech.     Patient Active Problem List   Diagnosis Date Noted  . Vitamin B12 deficiency   . Long term current use of anticoagulant 12/25/2016  . COPD (chronic obstructive pulmonary disease) ([place]) 04/18/2016  . Paroxysmal atrial fibrillation ([place]) 01/02/2016  . Hypokalemia 11/03/2014  . URI (upper respiratory infection) 11/03/2014  . Weakness 11/03/2014  . Obstructive sleep apnea 08/07/2014  . Central sleep apnea 08/07/2014  . Inguinal hernia 05/04/2014  . Chest pain with moderate risk for cardiac etiology 11/25/2013  . Fever 09/13/2013  . Pneumonia 09/13/2013  . Pancytopenia ([place]) 09/13/2013  . Muscle weakness (generalized) 08/04/2013  . Status post arthroscopy of shoulder 08/04/2013  . Pain in joint, shoulder region 08/04/2013  . Rotator cuff tear arthropathy of right shoulder 07/21/2013  . Left rotator cuff tear arthropathy 07/21/2013  . Shortness of breath 07/07/2013  . 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 Hyperlipidemia with target LDL less than 100 10/08/2012  . Prediabetes 10/08/2012  . Expressive aphasia 10/08/2012  . Hemiplegia affecting right dominant side ([place]) 10/08/2012  . 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 Hx of bladder cancer 10/07/2012  . 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 ASTHMA, UNSPECIFIED 03/22/2010  . Nonspecific (abnormal) findings on radiological and other examination of  body structure 03/22/2010  . Hx of lymphoma 03/21/2010  . Multiple myeloma in remission ([place]) 03/21/2010  . Anxiety state 03/21/2010  . Essential hypertension 03/21/2010  . MYOCARDIAL INFARCTION 03/21/2010  . Coronary atherosclerosis 03/21/2010  . NEPHROLITHIASIS 03/21/2010  . ELEVATED PROSTATE SPECIFIC ANTIGEN 03/21/2010  . ROTATOR CUFF REPAIR, RIGHT, HX OF 03/21/2010    Past Surgical History:  Procedure Laterality Date  . BLADDER  SURGERY    . BONE MARROW TRANSPLANT  2011  . COLON SURGERY     colon resection  . COLONOSCOPY N/A 01/01/2013   Procedure: COLONOSCOPY;  Surgeon: Rogene Houston, MD;  Location: [facility] ENDO SUITE;  Service: Endoscopy;  Laterality: N/A;  825-moved to [place] notified pt  . CORONARY ANGIOPLASTY  06/24/2000   PCI and stenting in mid & proximal RCA  . heart stents x 5  1999  . INGUINAL HERNIA REPAIR Right 05/04/2014   Procedure: OPEN RIGHT INGUINAL HERNIA REPAIR with mesh;  Surgeon: Edward Jolly, MD;  Location: [facility] ORS;  Service: General;  Laterality: Right;  . INSERT / REPLACE / REMOVE PACEMAKER    . left ear skin cancer removed    . NM MYOCAR PERF WALL MOTION  11/27/2007   inferior scar  . PACEMAKER INSERTION  07/22/2011   Medtronic  . PORTACATH PLACEMENT  07/26/2009   right chest  . PROSTATE SURGERY    . Rotator    . ROTATOR CUFF REPAIR Right   . SHOULDER ARTHROSCOPY WITH SUBACROMIAL DECOMPRESSION Right 07/21/2013   Procedure: RIGHT SHOULDER ARTHROSCOPY WITH SUBACROMIAL DECOMPRESSION AND DEBRIDEMENT & Injection of Left Shoulder;  Surgeon: Alta Corning, MD;  Location: [place];  Service: Orthopedics;  Laterality: Right;  . TEE WITHOUT CARDIOVERSION  10/13/2012   Procedure: TRANSESOPHAGEAL ECHOCARDIOGRAM (TEE);  Surgeon: Sanda Klein, MD;  Location: [facility] ENDOSCOPY;  Service: Cardiovascular;  Laterality: N/A;  pat/kay/echo notified  . US ECHOCARDIOGRAPHY  06/19/2011   RV mildly dilated,mild to mod. MR,mild AI,mild PI  . WRIST SURGERY     right        Home Medications    Prior to Admission medications   Medication Sig Start Date End Date Taking? Authorizing Provider  albuterol (PROVENTIL HFA;VENTOLIN HFA) 108 (90 Base) MCG/ACT inhaler Inhale 2 puffs into the lungs every 6 (six) hours as needed for wheezing or shortness of breath. 11/17/17   Mikey Kirschner, MD  albuterol (PROVENTIL HFA;VENTOLIN HFA) 108 (90 Base) MCG/ACT inhaler Inhale into the lungs. 03/25/16   [provider]   albuterol (PROVENTIL) (2.5 MG/3ML) 0.083% nebulizer solution Use via neb q 4 hours prn wheezing 11/17/17   Mikey Kirschner, MD  ALPRAZolam Duanne Moron) 0.5 MG tablet Take 1-2 tablets (0.5-1 mg total) by mouth at bedtime as needed for anxiety. 02/09/15   Baird Cancer, PA-C  amLODipine (NORVASC) 10 MG tablet TAKE 1 TABLET (10 MG TOTAL) BY MOUTH EVERY MORNING. 11/05/17   Mikey Kirschner, MD  azithromycin (ZITHROMAX) 500 MG tablet  03/10/18   [provider]  budesonide (PULMICORT) 0.5 MG/2ML nebulizer solution Take 2 mLs (0.5 mg total) by nebulization 2 (two) times daily. Patient taking differently: Take 0.5 mg by nebulization daily as needed (for shortness of breath).  04/19/16   Nilda Simmer, NP  citalopram (CELEXA) 40 MG tablet TAKE 1 TABLET (40 MG TOTAL) BY MOUTH DAILY. 11/05/17   Mikey Kirschner, MD  clopidogrel (PLAVIX) 75 MG tablet TAKE 1 TABLET BY MOUTH EVERY DAY 01/12/18   Croitoru, Dani Gobble, MD  diclofenac sodium (VOLTAREN) 1 % GEL Apply 2 g topically 4 (four) times daily as needed. 11/05/17   Mikey Kirschner, MD  ELIQUIS 5 MG TABS tablet TAKE 1 TABLET BY MOUTH TWICE A DAY 02/27/18   Croitoru, Mihai, MD  furosemide (LASIX) 20 MG tablet Use sparingly as needed for edema. No more than one tablet by mouth per week. 07/29/17   Lorretta Harp, MD  HYDROcodone-acetaminophen (NORCO) 7.5-325 MG tablet Take 1 tablet by mouth every 6 (six) hours as needed. for pain 02/10/18   [provider]  HYDROcodone-acetaminophen (NORCO/VICODIN) 5-325 MG tablet Take 1 tablet by mouth every 6 (six) hours as needed. 10/13/17   Twana First, MD  HYDROcodone-homatropine [facility]) 5-1.5 MG/5ML syrup Take one tsp qhs prn cough 11/05/17   Mikey Kirschner, MD  KLOR-CON M20 20 MEQ tablet TAKE 1 TABLET BY MOUTH 3 TIMES A DAY 12/31/17   Holley Bouche, NP  lenalidomide (REVLIMID) 10 MG capsule TAKE 1 CAPSULE BY MOUTH DAILY FOR 7 DAYS ON FOLLOWED BY 7 DAYS OFF. THEN REPEAT CYCLE. 02/06/18   [place],  Mathis Dad, MD  levocetirizine (XYZAL) 5 MG tablet Take 5 mg by mouth daily as needed for allergies.     [provider]  LYRICA 75 MG capsule  05/30/16   [provider]  mometasone (NASONEX) 50 MCG/ACT nasal spray SPRAY 1 SPRAY INTO EACH NOSTRIL TWICE A DAY 03/07/18   [provider]  nitroGLYCERIN (NITROSTAT) 0.4 MG SL tablet Place 1 tablet (0.4 mg total) under the tongue every 5 (five) minutes as needed for chest pain. 07/29/17   Lorretta Harp, MD  predniSONE (STERAPRED UNI-PAK 21 TAB) 10 MG (21) TBPK tablet  03/10/18   [provider]  simvastatin (ZOCOR) 20 MG tablet TAKE 1 TABLET BY MOUTH EVERYDAY AT BEDTIME 12/08/17   Minus Breeding, MD    Family History Family History  Problem Relation Age of Onset  . Cancer Father        bladder  . Heart disease Father        before age 24  . Hypertension Mother   . Cancer Brother   . Heart disease Brother        before age 43  . Heart disease Sister        before age 65  . Hyperlipidemia Sister   . Hypertension Sister   . Heart attack Sister   . Colon cancer Neg Hx   . Colon polyps Neg Hx     Social History Social History   Tobacco Use  . Smoking status: Former Smoker    Packs/day: 1.00    Years: 20.00    Pack years: 20.00    Types: Cigarettes    Last attempt to quit: 11/14/1994    Years since quitting: 23.3  . Smokeless tobacco: Never Used  Substance Use Topics  . Alcohol use: No    Alcohol/week: 0.0 oz    Comment: previously drank but none for at least 15 years.  . Drug use: No     Allergies   Morphine and related and Tape   Review of Systems Review of Systems All systems reviewed and negative, other than as noted in HPI.   Physical Exam Updated Vital Signs BP 134/71   Pulse 85   Temp 98.4 F (36.9 C) (Oral)   Resp (!) 24 Comment: 18-24  SpO2 91%   Physical Exam  Constitutional: He appears well-developed and well-nourished. No distress.  HENT:  Head: Normocephalic  and  atraumatic.  Eyes: Conjunctivae are normal. Right eye exhibits no discharge. Left eye exhibits no discharge.  Neck: Neck supple.  Cardiovascular: Normal rate, regular rhythm and normal heart sounds. Exam reveals no gallop and no friction rub.  No murmur heard. Pulmonary/Chest: He has wheezes.  Mild to moderate respiratory distress.  Respiratory rate is 30s.  Bilateral wheezing  Abdominal: Soft. He exhibits no distension. There is no tenderness.  Musculoskeletal: He exhibits no edema or tenderness.  Neurological: He is alert.  Skin: Skin is warm and dry.  Psychiatric: He has a normal mood and affect. His behavior is normal. Thought content normal.  Nursing note and vitals reviewed.    ED Treatments / Results  Labs (all labs ordered are listed, but only abnormal results are displayed) Labs Reviewed  CBC WITH DIFFERENTIAL/PLATELET - Abnormal; Notable for the following components:      Result Value   RBC 4.20 (*)    Hemoglobin 11.7 (*)    HCT 36.5 (*)    RDW 16.2 (*)    Platelets 133 (*)    Lymphs Abs 4.1 (*)    All other components within normal limits  BASIC METABOLIC PANEL - Abnormal; Notable for the following components:   CO2 20 (*)    Glucose, Bld 109 (*)    All other components within normal limits    EKG EKG Interpretation  Date/Time:  Wednesday Mar 11 2018 15:25:53 EDT Ventricular Rate:  83 PR Interval:    QRS Duration: 112 QT Interval:  400 QTC Calculation: 470 R Axis:   -34 Text Interpretation:  Accelerated Junctional rhythm Left axis deviation Inferior infarct , age undetermined Abnormal ECG No significant change since last tracing Confirmed by Orlie Dakin [phone]) on 03/11/2018 3:33:17 PM   Radiology Dg Chest 2 View  Result Date: 03/11/2018 CLINICAL DATA:  Cough, congestion and fever. History of prostate cancer, COPD and congestive heart failure. EXAM: CHEST - 2 VIEW COMPARISON:  Radiographs 01/12/2018 and 01/06/2018.  CT 04/24/2016. FINDINGS: The heart  size and mediastinal contours are stable. There is aortic atherosclerosis, a left subclavian AV sequential pacemaker and a right IJ Port-A-Cath, the latter terminating at the superior cavoatrial junction. Coronary artery calcifications are present. There is central airway thickening with new patchy airspace opacities at both lung bases, best seen on the frontal examination. Peripheral densities overlying the left chest on the PA view are largely attributed to old rib fractures. No acute osseous findings or significant pleural effusion. IMPRESSION: New patchy bibasilar airspace opacities suspicious for early pneumonia. Followup PA and lateral chest X-ray is recommended in 3-4 weeks following trial of antibiotic therapy to ensure resolution and exclude underlying malignancy. Electronically Signed   By: Richardean Sale M.D.   On: 03/11/2018 16:16    Procedures Procedures (including critical care time)  Medications Ordered in ED Medications  albuterol (PROVENTIL,VENTOLIN) solution continuous neb (has no administration in time range)  ipratropium (ATROVENT) nebulizer solution 0.5 mg (has no administration in time range)  albuterol (PROVENTIL) (2.5 MG/3ML) 0.083% nebulizer solution 5 mg (5 mg Nebulization Given 03/11/18 1542)     Initial Impression / Assessment and Plan / ED Course  I have reviewed the triage vital signs and the nursing notes.  Pertinent labs & imaging results that were available during my care of the patient were reviewed by me and considered in my medical decision making (see chart for details).     77 year old male with worsening dyspnea since Saturday.  Reportedly febrile today although  he is a febrile in the emergency room.  Chest x-ray with possible pneumonia.  Oxygen saturations are adequate but he has no respiratory rate in 30s after several nebs.  Will admit for ongoing treatment.   Final Clinical Impressions(s) / ED Diagnoses   Final diagnoses:  Community acquired  pneumonia, unspecified laterality    ED Discharge Orders    None       Virgel Manifold, MD 03/12/18 2323

## 2018-03-11 NOTE — Progress Notes (Signed)
To treatment room for IVIG.  Patient stated he has been running chills with increased SOB for several days.  Was checked out by his PCP yesterday and was given medications with a CXR to be scheduled for tomorrow.  Reviewed symptoms with Dr. Delton Coombes and low grade temp today with verbal orders to reschedule for next week.  Reviewed with the patient and if his symptoms worsen to report to the emergency room with understanding verbalized.

## 2018-03-12 ENCOUNTER — Other Ambulatory Visit: Payer: Self-pay

## 2018-03-12 ENCOUNTER — Encounter (HOSPITAL_COMMUNITY): Payer: Self-pay | Admitting: Adult Health

## 2018-03-12 DIAGNOSIS — D801 Nonfamilial hypogammaglobulinemia: Secondary | ICD-10-CM | POA: Diagnosis not present

## 2018-03-12 DIAGNOSIS — J189 Pneumonia, unspecified organism: Secondary | ICD-10-CM | POA: Diagnosis not present

## 2018-03-12 DIAGNOSIS — I480 Paroxysmal atrial fibrillation: Secondary | ICD-10-CM | POA: Diagnosis not present

## 2018-03-12 DIAGNOSIS — I10 Essential (primary) hypertension: Secondary | ICD-10-CM

## 2018-03-12 DIAGNOSIS — Z8572 Personal history of non-Hodgkin lymphomas: Secondary | ICD-10-CM | POA: Diagnosis not present

## 2018-03-12 DIAGNOSIS — J441 Chronic obstructive pulmonary disease with (acute) exacerbation: Secondary | ICD-10-CM

## 2018-03-12 DIAGNOSIS — Z9481 Bone marrow transplant status: Secondary | ICD-10-CM | POA: Diagnosis not present

## 2018-03-12 DIAGNOSIS — J440 Chronic obstructive pulmonary disease with acute lower respiratory infection: Secondary | ICD-10-CM | POA: Diagnosis not present

## 2018-03-12 DIAGNOSIS — C9001 Multiple myeloma in remission: Secondary | ICD-10-CM | POA: Diagnosis not present

## 2018-03-12 LAB — PROTEIN ELECTROPHORESIS, SERUM
A/G Ratio: 1 (ref 0.7–1.7)
ALBUMIN ELP: 3.3 g/dL (ref 2.9–4.4)
Alpha-1-Globulin: 0.3 g/dL (ref 0.0–0.4)
Alpha-2-Globulin: 0.9 g/dL (ref 0.4–1.0)
Beta Globulin: 1.1 g/dL (ref 0.7–1.3)
GAMMA GLOBULIN: 1.1 g/dL (ref 0.4–1.8)
Globulin, Total: 3.4 g/dL (ref 2.2–3.9)
Total Protein ELP: 6.7 g/dL (ref 6.0–8.5)

## 2018-03-12 LAB — BASIC METABOLIC PANEL
Anion gap: 10 (ref 5–15)
BUN: 18 mg/dL (ref 6–20)
CHLORIDE: 103 mmol/L (ref 101–111)
CO2: 21 mmol/L — AB (ref 22–32)
Calcium: 8.7 mg/dL — ABNORMAL LOW (ref 8.9–10.3)
Creatinine, Ser: 1.18 mg/dL (ref 0.61–1.24)
GFR calc non Af Amer: 59 mL/min — ABNORMAL LOW (ref >60)
Glucose, Bld: 139 mg/dL — ABNORMAL HIGH (ref 65–99)
POTASSIUM: 4 mmol/L (ref 3.5–5.1)
SODIUM: 134 mmol/L — AB (ref 135–145)

## 2018-03-12 LAB — CBC WITH DIFFERENTIAL/PLATELET
Basophils Absolute: 0 10*3/uL (ref 0.0–0.1)
Basophils Relative: 0 %
Eosinophils Absolute: 0 10*3/uL (ref 0.0–0.7)
Eosinophils Relative: 0 %
HCT: 35.1 % — ABNORMAL LOW (ref 39.0–52.0)
HEMOGLOBIN: 11.2 g/dL — AB (ref 13.0–17.0)
LYMPHS ABS: 2.6 10*3/uL (ref 0.7–4.0)
LYMPHS PCT: 30 %
MCH: 27.9 pg (ref 26.0–34.0)
MCHC: 31.9 g/dL (ref 30.0–36.0)
MCV: 87.3 fL (ref 78.0–100.0)
MONOS PCT: 6 %
Monocytes Absolute: 0.5 10*3/uL (ref 0.1–1.0)
NEUTROS PCT: 64 %
Neutro Abs: 5.3 10*3/uL (ref 1.7–7.7)
Platelets: 140 10*3/uL — ABNORMAL LOW (ref 150–400)
RBC: 4.02 MIL/uL — AB (ref 4.22–5.81)
RDW: 16.2 % — ABNORMAL HIGH (ref 11.5–15.5)
WBC: 8.4 10*3/uL (ref 4.0–10.5)

## 2018-03-12 LAB — KAPPA/LAMBDA LIGHT CHAINS
KAPPA, LAMDA LIGHT CHAIN RATIO: 1.64 (ref 0.26–1.65)
Kappa free light chain: 64.7 mg/L — ABNORMAL HIGH (ref 3.3–19.4)
Lambda free light chains: 39.4 mg/L — ABNORMAL HIGH (ref 5.7–26.3)

## 2018-03-12 LAB — IGG, IGA, IGM
IGA: 445 mg/dL — AB (ref 61–437)
IGG (IMMUNOGLOBIN G), SERUM: 1149 mg/dL (ref 700–1600)
IGM (IMMUNOGLOBULIN M), SRM: 36 mg/dL (ref 15–143)

## 2018-03-12 MED ORDER — FULVESTRANT 250 MG/5ML IM SOLN
INTRAMUSCULAR | Status: AC
  Filled 2018-03-12: qty 5

## 2018-03-12 MED ORDER — AMOXICILLIN-POT CLAVULANATE 875-125 MG PO TABS: 1.0000 | ORAL_TABLET | Freq: Two times a day (BID) | ORAL | 0 refills | Status: DC

## 2018-03-12 MED ORDER — CITALOPRAM HYDROBROMIDE 20 MG PO TABS
40.0000 mg | ORAL_TABLET | Freq: Every day | ORAL | Status: DC
  Administered 2018-03-12: 40 mg via ORAL
  Filled 2018-03-12: qty 2

## 2018-03-12 MED ORDER — BUDESONIDE 0.5 MG/2ML IN SUSP
0.5000 mg | Freq: Two times a day (BID) | RESPIRATORY_TRACT | Status: DC
  Administered 2018-03-12: 0.5 mg via RESPIRATORY_TRACT
  Filled 2018-03-12: qty 2

## 2018-03-12 MED ORDER — AMLODIPINE BESYLATE 5 MG PO TABS
5.0000 mg | ORAL_TABLET | Freq: Every day | ORAL | Status: DC
  Administered 2018-03-12: 5 mg via ORAL
  Filled 2018-03-12: qty 1

## 2018-03-12 MED ORDER — AMOXICILLIN-POT CLAVULANATE 875-125 MG PO TABS: 1.0000 | ORAL_TABLET | Freq: Two times a day (BID) | ORAL | Status: DC

## 2018-03-12 MED ORDER — POTASSIUM CHLORIDE CRYS ER 20 MEQ PO TBCR: 20.0000 meq | EXTENDED_RELEASE_TABLET | Freq: Every day | ORAL | Status: DC

## 2018-03-12 MED ORDER — PREDNISONE 20 MG PO TABS: ORAL_TABLET | ORAL | 0 refills | Status: DC

## 2018-03-12 NOTE — Plan of Care (Signed)
progressing 

## 2018-03-12 NOTE — Discharge Summary (Signed)
Physician Discharge Summary  Jeremy Parrish BZJ:[id number] DOB: 03-22-1941 DOA: 03/11/2018  PCP: Celene Squibb, MD  Admit date: 03/11/2018 Discharge date: 03/12/2018  Time spent: 35 minutes  Recommendations for Outpatient Follow-up:  1. Repeat basic metabolic panel to follow electrolytes and renal function 2. Repeat chest x-ray in 4 to 6 weeks to follow complete resolution of infiltrates.  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  Discharge home with instruction to follow-up with PCP in 10 days.  Patient will also follow-up at oncology office as instructed.  Diet recommendation: Heart healthy diet.  History of present illness:  As per H&P by Dr. Shanon Brow on 03/11/18 77 y.o. male with medical history significant of COPD, ex-smoker, CHF comes in with several days of coughing fever and not feeling well.  Patient went to see his primary care physician yesterday who started him on amoxicillin.  Patient continued to spike fever so came emergency department today.  Patient found to have bilateral pneumonia.  His O2 sats are normal.  He is slightly tachypneic.  Patient referred for admission for pneumonia with COPD exacerbation.  He has been wheezing a lot also over the last several days  Hospital Course:  1-COPD exacerbation and CAP -improved and feeling close to his baseline -still with wheezing and rhonchi on exam -will discharge on steroids tapering and augmentin  -patient instructed to follow up with PCP -no using accessory muscles, with good O2 sat, no fever and with normal WBC's  2-hx of Lymphoma -stable -continue lenalidomide  -continue outpatient follow up with  oncology service -patient due for IVIG infusion; oncology service will arrange visit after discharge  3-attention: -Stable and well-controlled -Continue home antihypertensive regimen -Encouraged to follow heart healthy diet.  4-history of Tachy-bradycardia: -s/p pacemaker implantation -HR stable and well controlled  5-PAF -rate controlled -continue eliquis  6-depression -continue citalopram  7-GERD -continue PPI  Procedures:  See below for x-ray reports   Consultations:  None   Discharge Exam: Vitals:   03/12/18 0421 03/12/18 1022  BP: 117/70   Pulse: 69   Resp:    Temp: (!) 97.5 F (36.4 C)   SpO2: 98% 98%    General: Afebrile, no chest pain, reports his breathing is pretty close to baseline.  Still having some wheezing and shortness of breath, but in no distress. Cardiovascular: S1 and S2, no rubs, no gallops, no JVD Respiratory: No using accessory muscles, good oxygen saturation on room air, mild expiratory wheezing and scattered rhonchi. Abd: soft, no tenderness, no guarding, no distension. Extremities: no edema, no cyanosis, no clubbing   Discharge Instructions   Discharge Instructions    Diet - low sodium heart healthy   Complete by:  As directed    Discharge instructions   Complete by:  As directed    Take medications as prescribed. Keep yourself well-hydrated. Follow-up with PCP and oncology as an outpatient.     Allergies as of 03/12/2018      Reactions   Morphine And Related Other (See Comments)   hallucinations   Tape Rash   Paper tape is ok      Medication List    STOP taking these medications   predniSONE 10 MG (21)  Tbpk tablet Commonly known as:  STERAPRED UNI-PAK 21 TAB Replaced by:  predniSONE 20 MG tablet     TAKE these medications   albuterol 108 (90 Base) MCG/ACT inhaler Commonly known as:  PROVENTIL HFA;VENTOLIN HFA Inhale 2 puffs into the lungs every 6 (six) hours as needed for wheezing or shortness of breath.    albuterol (2.5 MG/3ML) 0.083% nebulizer solution Commonly known as:  PROVENTIL Use via neb q 4 hours prn wheezing   ALPRAZolam 0.5 MG tablet Commonly known as:  XANAX Take 1-2 tablets (0.5-1 mg total) by mouth at bedtime as needed for anxiety.   amLODipine 10 MG tablet Commonly known as:  NORVASC TAKE 1 TABLET (10 MG TOTAL) BY MOUTH EVERY MORNING.   amoxicillin-clavulanate 875-125 MG tablet Commonly known as:  AUGMENTIN Take 1 tablet by mouth every 12 (twelve) hours.   citalopram 40 MG tablet Commonly known as:  CELEXA TAKE 1 TABLET (40 MG TOTAL) BY MOUTH DAILY.   clopidogrel 75 MG tablet Commonly known as:  PLAVIX TAKE 1 TABLET BY MOUTH EVERY DAY   diclofenac sodium 1 % Gel Commonly known as:  VOLTAREN Apply 2 g topically 4 (four) times daily as needed.   ELIQUIS 5 MG Tabs tablet Generic drug:  apixaban TAKE 1 TABLET BY MOUTH TWICE A DAY   furosemide 20 MG tablet Commonly known as:  LASIX Use sparingly as needed for edema. No more than one tablet by mouth per week. What changed:    how much to take  how to take this  when to take this  additional instructions   HYDROcodone-acetaminophen 7.5-325 MG tablet Commonly known as:  NORCO Take 1 tablet by mouth every 6 (six) hours as needed. for pain   KLOR-CON M20 20 MEQ tablet Generic drug:  potassium chloride SA TAKE 1 TABLET BY MOUTH 3 TIMES A DAY   lenalidomide 10 MG capsule Commonly known as:  REVLIMID TAKE 1 CAPSULE BY MOUTH DAILY FOR 7 DAYS ON FOLLOWED BY 7 DAYS OFF. THEN REPEAT CYCLE.   levocetirizine 5 MG tablet Commonly known as:  XYZAL Take 5 mg by mouth every evening.   mometasone 50 MCG/ACT nasal spray Commonly known as:  NASONEX SPRAY 1 SPRAY INTO EACH NOSTRIL TWICE A DAY   nitroGLYCERIN 0.4 MG SL tablet Commonly known as:  NITROSTAT Place 1 tablet (0.4 mg total) under the tongue every 5 (five) minutes as needed for chest pain.   omeprazole 20 MG capsule Commonly known as:  PRILOSEC Take  20 mg by mouth daily.   predniSONE 20 MG tablet Commonly known as:  DELTASONE Take 3 tablets by mouth daily X 1 day; then 2 tablets by mouth daily X 2 days; then 1 tablet by mouth daily X 2 days; then 1/2 tablet by mouth daily x 3 days and then stop prednisone. Replaces:  predniSONE 10 MG (21) Tbpk tablet   simvastatin 20 MG tablet Commonly known as:  ZOCOR TAKE 1 TABLET BY MOUTH EVERYDAY AT BEDTIME      Allergies  Allergen Reactions  . Morphine And Related Other (See Comments)    hallucinations  . Tape Rash    Paper tape is ok   Follow-up Information    Celene Squibb, MD. Schedule an appointment as soon as possible for a visit in 10 day(s).   Specialty:  Internal Medicine Contact information: [place] [address] [phone]           The results of significant diagnostics  from this hospitalization (including imaging, microbiology, ancillary and laboratory) are listed below for reference.    Significant Diagnostic Studies: Dg Chest 2 View  Result Date: 03/11/2018 CLINICAL DATA:  Cough, congestion and fever. History of prostate cancer, COPD and congestive heart failure. EXAM: CHEST - 2 VIEW COMPARISON:  Radiographs 01/12/2018 and 01/06/2018.  CT 04/24/2016. FINDINGS: The heart size and mediastinal contours are stable. There is aortic atherosclerosis, a left subclavian AV sequential pacemaker and a right IJ Port-A-Cath, the latter terminating at the superior cavoatrial junction. Coronary artery calcifications are present. There is central airway thickening with new patchy airspace opacities at both lung bases, best seen on the frontal examination. Peripheral densities overlying the left chest on the PA view are largely attributed to old rib fractures. No acute osseous findings or significant pleural effusion. IMPRESSION: New patchy bibasilar airspace opacities suspicious for early pneumonia. Followup PA and lateral chest X-ray is recommended in 3-4 weeks  following trial of antibiotic therapy to ensure resolution and exclude underlying malignancy. Electronically Signed   By: Richardean Sale M.D.   On: 03/11/2018 16:16    Microbiology: Recent Results (from the past 240 hour(s))  Culture, blood (routine x 2) Call MD if unable to obtain prior to antibiotics being given     Status: None (Preliminary result)   Collection Time: 03/11/18  7:36 PM  Result Value Ref Range Status   Specimen Description RIGHT ANTECUBITAL  Final   Special Requests   Final    BOTTLES DRAWN AEROBIC AND ANAEROBIC Blood Culture adequate volume   Culture   Final    NO GROWTH < 12 HOURS Performed at [facility], [address], [place], [place] [address]    Report Status PENDING  Incomplete  Culture, blood (routine x 2) Call MD if unable to obtain prior to antibiotics being given     Status: None (Preliminary result)   Collection Time: 03/11/18  7:36 PM  Result Value Ref Range Status   Specimen Description BLOOD RIGHT WRIST  Final   Special Requests   Final    BOTTLES DRAWN AEROBIC AND ANAEROBIC Blood Culture adequate volume   Culture   Final    NO GROWTH < 12 HOURS Performed at [facility], [address], [place], [place] [address]    Report Status PENDING  Incomplete     Labs: Basic Metabolic Panel: Recent Labs  Lab 03/11/18 0903 03/11/18 1534 03/12/18 0609  NA 135 135 134*  K 3.7 4.0 4.0  CL 105 104 103  CO2 19* 20* 21*  GLUCOSE 151* 109* 139*  BUN 8 8 18   CREATININE 1.02 1.09 1.18  CALCIUM 8.9 9.1 8.7*   Liver Function Tests: Recent Labs  Lab 03/11/18 0903  AST 32  ALT 19  ALKPHOS 82  BILITOT 0.6  PROT 7.2  ALBUMIN 3.4*   CBC: Recent Labs  Lab 03/11/18 0903 03/11/18 1534 03/12/18 0609  WBC 8.5 8.9 8.4  NEUTROABS 3.6 4.0 5.3  HGB 11.5* 11.7* 11.2*  HCT 36.7* 36.5* 35.1*  MCV 87.4 86.9 87.3  PLT 120* 133* 140*    Signed:  Barton Dubois MD.  [facility] 03/12/2018, 3:05 PM

## 2018-03-12 NOTE — Progress Notes (Signed)
Patient currently hospitalized with COPD exacerbation/pneumonia.  Discussed pt's case with Dr. Dyann Kief, [facility] hospitalist.  Jeremy Parrish expressed concerns to Dr. Dyann Kief re: missing his recent IVIG infusion and when this will be rescheduled.    Dr. Dyann Kief shared with me that he plans to discharge Jeremy Parrish soon with 7 days of oral antibiotics.  Therefore, we will make arrangements for patient to be seen by Dr. Delton Coombes and have his IVIG infusion rescheduled to that time.  Dr. Dyann Kief is in agreement.   Scheduling message request sent to our cancer center schedulers to help make these appts for him before discharge from the hospital so the new appts will show up on his discharge summary.     Mike Craze, NP [place] [phone]

## 2018-03-12 NOTE — Care Management Note (Signed)
Case Management Note  Patient Details  Name: Jeremy Parrish MRN: [id number] Date of Birth: 15-Mar-1941  Subjective/Objective:  Adm with CAP, COPD exacerbation. From home with wife. Ind with ADL's. Walks with cane at times. Has PCP-Dr. Nevada Crane. Still drives. Has neb machine at home. No home health pta. Has insurance with prescription coverage. Get prescriptions filled at [facility].                 Action/Plan: DC home with self care. No CM needs known.    Expected Discharge Date:                  Expected Discharge Plan:  Home/Self Care  In-House Referral:     Discharge planning Services  CM Consult  Post Acute Care Choice:    Choice offered to:     DME Arranged:    DME Agency:     HH Arranged:    [place] Agency:     Status of Service:  Completed, signed off  If discussed at [employer] of Stay Meetings, dates discussed:    Additional Comments:  Edward Trevino, [place]un[place], RN 03/12/2018, 11:34 AM

## 2018-03-12 NOTE — Plan of Care (Addendum)
Patient discharged. Patient alert and oriented. His vitals are stable. Reviewed discharge paper work with patient and his wife. Patient advised about follow-up appointments including calling and scheduling an appointment with Dr. Nevada Crane. Discussed patient medications including the start of Augmentin and Prednisone. Patient and wife verbalized understanding. All questions were answered. Patient's iv was removed. Patient was then escorted out by nursing staff via wheelchair.

## 2018-03-12 NOTE — Progress Notes (Signed)
Removed IV-clean, dry, intact. Sheena reviewed d/c paperwork with pt and wife.  Stable pt was wheeled to main entrance.

## 2018-03-13 LAB — BLOOD CULTURE ID PANEL (REFLEXED)
Acinetobacter baumannii: NOT DETECTED
CANDIDA ALBICANS: NOT DETECTED
Candida glabrata: NOT DETECTED
Candida krusei: NOT DETECTED
Candida parapsilosis: NOT DETECTED
Candida tropicalis: NOT DETECTED
ENTEROBACTER CLOACAE COMPLEX: NOT DETECTED
Enterobacteriaceae species: NOT DETECTED
Enterococcus species: NOT DETECTED
Escherichia coli: NOT DETECTED
HAEMOPHILUS INFLUENZAE: NOT DETECTED
KLEBSIELLA OXYTOCA: NOT DETECTED
Klebsiella pneumoniae: NOT DETECTED
Listeria monocytogenes: NOT DETECTED
METHICILLIN RESISTANCE: DETECTED — AB
NEISSERIA MENINGITIDIS: NOT DETECTED
Proteus species: NOT DETECTED
Pseudomonas aeruginosa: NOT DETECTED
STREPTOCOCCUS PNEUMONIAE: NOT DETECTED
STREPTOCOCCUS PYOGENES: NOT DETECTED
STREPTOCOCCUS SPECIES: NOT DETECTED
Serratia marcescens: NOT DETECTED
Staphylococcus aureus (BCID): NOT DETECTED
Staphylococcus species: DETECTED — AB
Streptococcus agalactiae: NOT DETECTED

## 2018-03-13 NOTE — Progress Notes (Signed)
Positive blood cultures-Gram + cocci in aerobic bottle drawn 03/11/18 @ 1936. Will send to [facility] for susceptibility. Dr. Darrick Meigs notified.

## 2018-03-14 LAB — CULTURE, BLOOD (ROUTINE X 2): SPECIAL REQUESTS: ADEQUATE

## 2018-03-15 LAB — CULTURE, BLOOD (ROUTINE X 2): SPECIAL REQUESTS: ADEQUATE

## 2018-03-16 ENCOUNTER — Other Ambulatory Visit: Payer: Self-pay

## 2018-03-16 ENCOUNTER — Other Ambulatory Visit (HOSPITAL_COMMUNITY): Payer: Self-pay | Admitting: Emergency Medicine

## 2018-03-16 DIAGNOSIS — C9001 Multiple myeloma in remission: Secondary | ICD-10-CM

## 2018-03-16 LAB — IMMUNOFIXATION ELECTROPHORESIS
IGA: 442 mg/dL — AB (ref 61–437)
IGM (IMMUNOGLOBULIN M), SRM: 35 mg/dL (ref 15–143)
IgG (Immunoglobin G), Serum: 1145 mg/dL (ref 700–1600)
Total Protein ELP: 6.6 g/dL (ref 6.0–8.5)

## 2018-03-16 MED ORDER — LENALIDOMIDE 10 MG PO CAPS: ORAL_CAPSULE | ORAL | 0 refills | Status: DC

## 2018-03-16 NOTE — Progress Notes (Signed)
revlimid refilled

## 2018-03-16 NOTE — Patient Outreach (Signed)
[facility]) Care Management  03/16/2018  Jeremy Parrish April 18, 1941 [id number]     EMMI-General Discharge RED ON EMMI ALERT Day # 1 Date: 03/15/18 Red Alert Reason: " Scheduled a follow up appt? No"   Outreach attempt # 1 to patient. Spouse answered the phone and idenntifed herself(ROI on file). She freely voiced that patient is not feeling well today and that they have contacted MD office. Patient has an appt to be seen on Thursday. Spouse advised to seek medical attention if patient's symptoms worsen and/or are unresolved. She voiced understanding.         Plan: RN CM will close case at this time.  Jeremy Montgomery, RN,BSN,CCM [facility] Telephonic Care Management Coordinator Direct Phone: [phone] Toll Free: [phone] Fax: [phone]

## 2018-03-18 ENCOUNTER — Other Ambulatory Visit (HOSPITAL_COMMUNITY): Payer: Self-pay

## 2018-03-18 DIAGNOSIS — B379 Candidiasis, unspecified: Secondary | ICD-10-CM | POA: Diagnosis not present

## 2018-03-18 DIAGNOSIS — C9001 Multiple myeloma in remission: Secondary | ICD-10-CM

## 2018-03-18 DIAGNOSIS — J189 Pneumonia, unspecified organism: Secondary | ICD-10-CM | POA: Diagnosis not present

## 2018-03-18 DIAGNOSIS — J441 Chronic obstructive pulmonary disease with (acute) exacerbation: Secondary | ICD-10-CM | POA: Diagnosis not present

## 2018-03-18 DIAGNOSIS — D801 Nonfamilial hypogammaglobulinemia: Secondary | ICD-10-CM

## 2018-03-18 DIAGNOSIS — Z6829 Body mass index (BMI) 29.0-29.9, adult: Secondary | ICD-10-CM | POA: Diagnosis not present

## 2018-03-19 ENCOUNTER — Inpatient Hospital Stay (HOSPITAL_COMMUNITY): Payer: Medicare Other

## 2018-03-19 VITALS — BP 143/69 | HR 75 | Temp 98.0°F | Resp 20 | Wt 186.2 lb

## 2018-03-19 DIAGNOSIS — C9001 Multiple myeloma in remission: Secondary | ICD-10-CM | POA: Diagnosis not present

## 2018-03-19 DIAGNOSIS — E538 Deficiency of other specified B group vitamins: Secondary | ICD-10-CM

## 2018-03-19 DIAGNOSIS — D801 Nonfamilial hypogammaglobulinemia: Secondary | ICD-10-CM | POA: Diagnosis not present

## 2018-03-19 LAB — COMPREHENSIVE METABOLIC PANEL
ALK PHOS: 79 U/L (ref 38–126)
ALT: 55 U/L (ref 17–63)
AST: 42 U/L — AB (ref 15–41)
Albumin: 3.1 g/dL — ABNORMAL LOW (ref 3.5–5.0)
Anion gap: 9 (ref 5–15)
BUN: 17 mg/dL (ref 6–20)
CO2: 23 mmol/L (ref 22–32)
CREATININE: 0.94 mg/dL (ref 0.61–1.24)
Calcium: 9 mg/dL (ref 8.9–10.3)
Chloride: 106 mmol/L (ref 101–111)
GFR calc Af Amer: >60 mL/min (ref >60)
GFR calc non Af Amer: >60 mL/min (ref >60)
GLUCOSE: 164 mg/dL — AB (ref 65–99)
Potassium: 3.8 mmol/L (ref 3.5–5.1)
Sodium: 138 mmol/L (ref 135–145)
Total Bilirubin: 0.8 mg/dL (ref 0.3–1.2)
Total Protein: 6.7 g/dL (ref 6.5–8.1)

## 2018-03-19 LAB — CBC WITH DIFFERENTIAL/PLATELET
BASOS ABS: 0 10*3/uL (ref 0.0–0.1)
Basophils Relative: 0 %
EOS ABS: 0.2 10*3/uL (ref 0.0–0.7)
EOS PCT: 4 %
HCT: 37.5 % — ABNORMAL LOW (ref 39.0–52.0)
HEMOGLOBIN: 11.8 g/dL — AB (ref 13.0–17.0)
LYMPHS PCT: 37 %
Lymphs Abs: 2.4 10*3/uL (ref 0.7–4.0)
MCH: 27.4 pg (ref 26.0–34.0)
MCHC: 31.5 g/dL (ref 30.0–36.0)
MCV: 87 fL (ref 78.0–100.0)
Monocytes Absolute: 0.5 10*3/uL (ref 0.1–1.0)
Monocytes Relative: 7 %
NEUTROS PCT: 52 %
Neutro Abs: 3.3 10*3/uL (ref 1.7–7.7)
Platelets: 147 10*3/uL — ABNORMAL LOW (ref 150–400)
RBC: 4.31 MIL/uL (ref 4.22–5.81)
RDW: 15.9 % — ABNORMAL HIGH (ref 11.5–15.5)
WBC: 6.4 10*3/uL (ref 4.0–10.5)

## 2018-03-19 MED ORDER — ACETAMINOPHEN 325 MG PO TABS
650.0000 mg | ORAL_TABLET | Freq: Once | ORAL | Status: AC
  Administered 2018-03-19: 650 mg via ORAL

## 2018-03-19 MED ORDER — HEPARIN SOD (PORK) LOCK FLUSH 100 UNIT/ML IV SOLN: 250.0000 [IU] | Freq: Once | INTRAVENOUS | Status: DC | PRN

## 2018-03-19 MED ORDER — ALTEPLASE 2 MG IJ SOLR: 2.0000 mg | Freq: Once | INTRAMUSCULAR | Status: DC | PRN

## 2018-03-19 MED ORDER — SODIUM CHLORIDE 0.9 % IJ SOLN
10.0000 mL | INTRAMUSCULAR | Status: DC | PRN
  Administered 2018-03-19: 10 mL
  Filled 2018-03-19: qty 10

## 2018-03-19 MED ORDER — CYANOCOBALAMIN 1000 MCG/ML IJ SOLN
1000.0000 ug | Freq: Once | INTRAMUSCULAR | Status: AC
  Administered 2018-03-19: 1000 ug via INTRAMUSCULAR
  Filled 2018-03-19: qty 1

## 2018-03-19 MED ORDER — HEPARIN SOD (PORK) LOCK FLUSH 100 UNIT/ML IV SOLN
500.0000 [IU] | Freq: Once | INTRAVENOUS | Status: AC | PRN
  Administered 2018-03-19: 500 [IU]
  Filled 2018-03-19: qty 5

## 2018-03-19 MED ORDER — IMMUNE GLOBULIN (HUMAN) 10 GM/100ML IV SOLN
400.0000 mg/kg | Freq: Once | INTRAVENOUS | Status: AC
  Administered 2018-03-19: 35 g via INTRAVENOUS
  Filled 2018-03-19: qty 200

## 2018-03-19 MED ORDER — SODIUM CHLORIDE 0.9 % IJ SOLN: 3.0000 mL | Freq: Once | INTRAMUSCULAR | Status: DC | PRN

## 2018-03-19 MED ORDER — DEXTROSE 5 % IV SOLN
INTRAVENOUS | Status: DC
  Administered 2018-03-19: 09:00:00 via INTRAVENOUS

## 2018-03-19 MED ORDER — ACETAMINOPHEN 325 MG PO TABS
ORAL_TABLET | ORAL | Status: AC
  Filled 2018-03-19: qty 2

## 2018-03-19 MED ORDER — SODIUM CHLORIDE 0.9 % IV SOLN: Freq: Once | INTRAVENOUS | Status: DC

## 2018-03-19 NOTE — Patient Instructions (Signed)
[place] at [facility]  Discharge Instructions:  You received your vitamin b12 shot today and your Privigen.  _______________________________________________________________  Thank you for choosing [place] at [facility] to provide your oncology and hematology care.  To afford each patient quality time with our providers, please arrive at least 15 minutes before your scheduled appointment.  You need to re-schedule your appointment if you arrive 10 or more minutes late.  We strive to give you quality time with our providers, and arriving late affects you and other patients whose appointments are after yours.  Also, if you no show three or more times for appointments you may be dismissed from the clinic.  Again, thank you for choosing [place] at [place] hope is that these requests will allow you access to exceptional care and in a timely manner. _______________________________________________________________  If you have questions after your visit, please contact our office at [phone] between the hours of 8:30 a.m. and 5:00 p.m. Voicemails left after 4:30 p.m. will not be returned until the following business day. _______________________________________________________________  For prescription refill requests, have your pharmacy contact our office. _______________________________________________________________  Recommendations made by the consultant and any test results will be sent to your referring physician. _______________________________________________________________

## 2018-03-19 NOTE — Progress Notes (Signed)
Patient to treatment room for IVIG.  Patient with recent admission of pneumonia last week.  Now taking two antibiotics and prednisone.  Was seen by PCP yesterday.  Patient denied fevers and chills.  Cough with white phlegm.  No worsening of fatigue or SOB.  Reviewed with Dr. Delton Coombes with ok to treat today.   Patient tolerated IVIG with no complaints voiced.  Port site clean and dry with no bruising or swelling noted at site.  No complaints of pain with port flush.  Band aid applied.  Vitamin b12 injection tolerated with no bruising or swelling noted.  Band aid applied.  VSs with discharge and left ambulatory with no s/s of distress noted.

## 2018-04-07 ENCOUNTER — Ambulatory Visit (INDEPENDENT_AMBULATORY_CARE_PROVIDER_SITE_OTHER): Payer: Medicare Other | Admitting: *Deleted

## 2018-04-07 ENCOUNTER — Telehealth: Payer: Self-pay

## 2018-04-07 ENCOUNTER — Telehealth: Payer: Self-pay | Admitting: Cardiovascular Disease

## 2018-04-07 DIAGNOSIS — I495 Sick sinus syndrome: Secondary | ICD-10-CM

## 2018-04-07 NOTE — Telephone Encounter (Signed)
New Message ° ° ° °1. Has your device fired? no ° °2. Is you device beeping? no ° °3. Are you experiencing draining or swelling at device site? no ° °4. Are you calling to see if we received your device transmission? yes ° °5. Have you passed out? no ° ° ° °Please route to Device Clinic Pool ° °

## 2018-04-07 NOTE — Telephone Encounter (Signed)
Transmission received and reviewed. Normal device function. Pt made aware. Direct number given to [facility] per request.

## 2018-04-07 NOTE — Telephone Encounter (Signed)
I spoke with the patient about this.

## 2018-04-08 DIAGNOSIS — Z6828 Body mass index (BMI) 28.0-28.9, adult: Secondary | ICD-10-CM | POA: Diagnosis not present

## 2018-04-08 DIAGNOSIS — R110 Nausea: Secondary | ICD-10-CM | POA: Diagnosis not present

## 2018-04-08 DIAGNOSIS — Z8572 Personal history of non-Hodgkin lymphomas: Secondary | ICD-10-CM | POA: Diagnosis not present

## 2018-04-08 DIAGNOSIS — J441 Chronic obstructive pulmonary disease with (acute) exacerbation: Secondary | ICD-10-CM | POA: Diagnosis not present

## 2018-04-08 DIAGNOSIS — J189 Pneumonia, unspecified organism: Secondary | ICD-10-CM | POA: Diagnosis not present

## 2018-04-08 NOTE — Progress Notes (Signed)
Remote pacemaker transmission.   

## 2018-04-09 LAB — CUP PACEART REMOTE DEVICE CHECK
Battery Voltage: 2.95 V
Brady Statistic RA Percent Paced: 84.2 %
Brady Statistic RV Percent Paced: 0.75 %
Date Time Interrogation Session: 20190528145022
Implantable Lead Implant Date: 20120910
Implantable Lead Implant Date: 20120910
Implantable Lead Location: 753859
Implantable Lead Location: 753860
Implantable Pulse Generator Implant Date: 20120910
Lead Channel Sensing Intrinsic Amplitude: 5.723 mV
Lead Channel Setting Pacing Amplitude: 2 V
Lead Channel Setting Sensing Sensitivity: 0.9 mV
MDC IDC MSMT LEADCHNL RA IMPEDANCE VALUE: 400 Ohm
MDC IDC MSMT LEADCHNL RA SENSING INTR AMPL: 1.907 mV
MDC IDC MSMT LEADCHNL RV IMPEDANCE VALUE: 408 Ohm
MDC IDC SET LEADCHNL RV PACING AMPLITUDE: 2.5 V
MDC IDC SET LEADCHNL RV PACING PULSEWIDTH: 0.6 ms
MDC IDC STAT BRADY AP VP PERCENT: 0.69 %
MDC IDC STAT BRADY AP VS PERCENT: 84.83 %
MDC IDC STAT BRADY AS VP PERCENT: 0.03 %
MDC IDC STAT BRADY AS VS PERCENT: 14.45 %

## 2018-04-10 ENCOUNTER — Encounter: Payer: Self-pay | Admitting: Cardiology

## 2018-04-13 ENCOUNTER — Ambulatory Visit (HOSPITAL_COMMUNITY): Payer: Medicare Other

## 2018-04-13 ENCOUNTER — Inpatient Hospital Stay (HOSPITAL_COMMUNITY): Payer: Medicare Other | Attending: Hematology

## 2018-04-13 ENCOUNTER — Other Ambulatory Visit (HOSPITAL_COMMUNITY): Payer: Medicare Other

## 2018-04-13 ENCOUNTER — Ambulatory Visit (HOSPITAL_COMMUNITY): Payer: Medicare Other | Admitting: Hematology

## 2018-04-13 DIAGNOSIS — C9001 Multiple myeloma in remission: Secondary | ICD-10-CM

## 2018-04-13 DIAGNOSIS — Z8572 Personal history of non-Hodgkin lymphomas: Secondary | ICD-10-CM | POA: Diagnosis not present

## 2018-04-13 DIAGNOSIS — D801 Nonfamilial hypogammaglobulinemia: Secondary | ICD-10-CM | POA: Diagnosis not present

## 2018-04-13 LAB — LACTATE DEHYDROGENASE: LDH: 148 U/L (ref 98–192)

## 2018-04-14 LAB — IGG, IGA, IGM
IGA: 427 mg/dL (ref 61–437)
IGM (IMMUNOGLOBULIN M), SRM: 30 mg/dL (ref 15–143)
IgG (Immunoglobin G), Serum: 1024 mg/dL (ref 700–1600)

## 2018-04-14 LAB — KAPPA/LAMBDA LIGHT CHAINS
Kappa free light chain: 50.1 mg/L — ABNORMAL HIGH (ref 3.3–19.4)
Kappa, lambda light chain ratio: 1.5 (ref 0.26–1.65)
LAMDA FREE LIGHT CHAINS: 33.3 mg/L — AB (ref 5.7–26.3)

## 2018-04-15 LAB — IMMUNOFIXATION ELECTROPHORESIS
IGG (IMMUNOGLOBIN G), SERUM: 1053 mg/dL (ref 700–1600)
IgA: 435 mg/dL (ref 61–437)
IgM (Immunoglobulin M), Srm: 34 mg/dL (ref 15–143)
Total Protein ELP: 6.3 g/dL (ref 6.0–8.5)

## 2018-04-15 LAB — PROTEIN ELECTROPHORESIS, SERUM
A/G Ratio: 1.1 (ref 0.7–1.7)
ALBUMIN ELP: 3.4 g/dL (ref 2.9–4.4)
Alpha-1-Globulin: 0.2 g/dL (ref 0.0–0.4)
Alpha-2-Globulin: 0.7 g/dL (ref 0.4–1.0)
Beta Globulin: 1 g/dL (ref 0.7–1.3)
GAMMA GLOBULIN: 1 g/dL (ref 0.4–1.8)
Globulin, Total: 3 g/dL (ref 2.2–3.9)
Total Protein ELP: 6.4 g/dL (ref 6.0–8.5)

## 2018-04-16 ENCOUNTER — Other Ambulatory Visit: Payer: Self-pay

## 2018-04-16 ENCOUNTER — Encounter (HOSPITAL_COMMUNITY): Payer: Self-pay

## 2018-04-16 ENCOUNTER — Inpatient Hospital Stay (HOSPITAL_BASED_OUTPATIENT_CLINIC_OR_DEPARTMENT_OTHER): Payer: Medicare Other | Admitting: Hematology

## 2018-04-16 ENCOUNTER — Encounter (HOSPITAL_COMMUNITY): Payer: Self-pay | Admitting: Hematology

## 2018-04-16 ENCOUNTER — Inpatient Hospital Stay (HOSPITAL_COMMUNITY): Payer: Medicare Other

## 2018-04-16 ENCOUNTER — Other Ambulatory Visit (HOSPITAL_COMMUNITY): Payer: Medicare Other

## 2018-04-16 VITALS — BP 130/76 | HR 75 | Temp 98.4°F | Resp 18 | Wt 186.0 lb

## 2018-04-16 VITALS — BP 136/82 | HR 68 | Temp 97.9°F | Resp 20

## 2018-04-16 DIAGNOSIS — Z8572 Personal history of non-Hodgkin lymphomas: Secondary | ICD-10-CM

## 2018-04-16 DIAGNOSIS — D801 Nonfamilial hypogammaglobulinemia: Secondary | ICD-10-CM

## 2018-04-16 DIAGNOSIS — E538 Deficiency of other specified B group vitamins: Secondary | ICD-10-CM

## 2018-04-16 DIAGNOSIS — C9001 Multiple myeloma in remission: Secondary | ICD-10-CM

## 2018-04-16 MED ORDER — CYANOCOBALAMIN 1000 MCG/ML IJ SOLN
1000.0000 ug | Freq: Once | INTRAMUSCULAR | Status: AC
  Administered 2018-04-16: 1000 ug via INTRAMUSCULAR
  Filled 2018-04-16: qty 1

## 2018-04-16 MED ORDER — ACETAMINOPHEN 325 MG PO TABS
650.0000 mg | ORAL_TABLET | Freq: Once | ORAL | Status: AC
  Administered 2018-04-16: 650 mg via ORAL
  Filled 2018-04-16: qty 2

## 2018-04-16 MED ORDER — IMMUNE GLOBULIN (HUMAN) 10 GM/100ML IV SOLN
400.0000 mg/kg | Freq: Once | INTRAVENOUS | Status: AC
  Administered 2018-04-16: 35 g via INTRAVENOUS
  Filled 2018-04-16: qty 100

## 2018-04-16 MED ORDER — DEXTROSE 5 % IV SOLN
INTRAVENOUS | Status: DC
  Administered 2018-04-16: 10:00:00 via INTRAVENOUS

## 2018-04-16 MED ORDER — HEPARIN SOD (PORK) LOCK FLUSH 100 UNIT/ML IV SOLN
500.0000 [IU] | Freq: Once | INTRAVENOUS | Status: AC | PRN
  Administered 2018-04-16: 500 [IU]
  Filled 2018-04-16: qty 5

## 2018-04-16 NOTE — Progress Notes (Signed)
[place] [address], [place] [address]   CLINIC:  Medical Oncology/Hematology  PCP:  Celene Squibb, MD [place] [address] [phone]   REASON FOR VISIT:  Follow-up for multiple myeloma and hypogammaglobulinemia.  CURRENT THERAPY: Revlimid 7 days on/7 days off   INTERVAL HISTORY:  Jeremy Parrish 77 y.o. male returns for follow-up of multiple myeloma and hypogammaglobulinemia.  He had a recent admission for pneumonia on 5/1 through 03/12/2018, discharged on Augmentin.  He no longer has any cough or shortness of breath.  He is taking Revlimid 1 week on, one-week off.  He has diarrhea when he takes Revlimid.  He takes up to 6 diarrhea pills per day.  Denies any new onset pains.  Denies any fevers, night sweats or weight loss.  No other infections were reported.    REVIEW OF SYSTEMS:  Review of Systems  Constitutional: Positive for fatigue.  Neurological: Positive for numbness.  Hematological: Bruises/bleeds easily.  All other systems reviewed and are negative.    PAST MEDICAL/SURGICAL HISTORY:  Past Medical History:  Diagnosis Date  . Anemia   . Aortic aneurysm of unspecified site without mention of rupture   . Arthritis   . Bladder neck contracture   . Cancer ([place])   . Cerebral atherosclerosis    Carotid Doppler, 02/16/2013 - Bilateral Proximal ICAs,demonstrate mild plaque w/o evidence of significant diameter reduction, dissection, or any other vascular abnormality  . CHF (congestive heart failure) ([place])   . Complication of anesthesia   . COPD (chronic obstructive pulmonary disease) ([place])   . Coronary artery disease   . Depression   . Esophageal reflux   . Heart disease   . Heart murmur   . Hx of bladder cancer 10/07/2012  . Hyperlipidemia   . Hypertension   . 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 Intestinovesical fistula   . Kidney stones    history    . Lung mass   . Multiple myeloma   . Myocardial infarction [facility])    '96  . Non Hodgkin's lymphoma ([place])   . Paroxysmal atrial fibrillation ([place]) 01/02/2016  . Peripheral arterial disease ([place])   . Personal history of other diseases of circulatory system   . PONV (postoperative nausea and vomiting)   . Prostate cancer ([place]) 2000  . Shingles   . Shortness of breath   . Sleep apnea    05-02-14 cpap , not yet used- suggested settings 5  . Stroke [facility]) 2013   Speech.   Past Surgical History:  Procedure Laterality Date  . BLADDER SURGERY    . BONE MARROW TRANSPLANT  2011  . COLON SURGERY     colon resection  . COLONOSCOPY N/A 01/01/2013   Procedure: COLONOSCOPY;  Surgeon: Rogene Houston, MD;  Location: [facility] ENDO SUITE;  Service: Endoscopy;  Laterality: N/A;  825-moved to [place] notified pt  . CORONARY ANGIOPLASTY  06/24/2000   PCI and stenting in mid & proximal RCA  . heart stents x 5  1999  . INGUINAL HERNIA REPAIR Right 05/04/2014   Procedure: OPEN RIGHT INGUINAL HERNIA REPAIR with mesh;  Surgeon: Edward Jolly, MD;  Location: [facility] ORS;  Service: General;  Laterality: Right;  . INSERT / REPLACE / REMOVE PACEMAKER    . left ear skin cancer removed    . NM MYOCAR PERF WALL MOTION  11/27/2007   inferior scar  .  PACEMAKER INSERTION  07/22/2011   [employer]  . PORTACATH PLACEMENT  07/26/2009   right chest  . PROSTATE SURGERY    . Rotator    . ROTATOR CUFF REPAIR Right   . SHOULDER ARTHROSCOPY WITH SUBACROMIAL DECOMPRESSION Right 07/21/2013   Procedure: RIGHT SHOULDER ARTHROSCOPY WITH SUBACROMIAL DECOMPRESSION AND DEBRIDEMENT & Injection of Left Shoulder;  Surgeon: Alta Corning, MD;  Location: [facility];  Service: Orthopedics;  Laterality: Right;  . TEE WITHOUT CARDIOVERSION  10/13/2012   Procedure: TRANSESOPHAGEAL ECHOCARDIOGRAM (TEE);  Surgeon: Sanda Klein, MD;  Location: [facility] ENDOSCOPY;  Service: Cardiovascular;  Laterality: N/A;  pat/kay/echo notified  . US ECHOCARDIOGRAPHY  06/19/2011    RV mildly dilated,mild to mod. MR,mild AI,mild PI  . WRIST SURGERY     right     SOCIAL HISTORY:  Social History   Socioeconomic History  . Marital status: Married    Spouse name: [name]  . Number of children: 3  . Years of education: 9th  . Highest education level: Not on file  Occupational History  . Occupation: retired   Scientific laboratory technician  . Financial resource strain: Not on file  . Food insecurity:    Worry: Not on file    Inability: Not on file  . Transportation needs:    Medical: Not on file    Non-medical: Not on file  Tobacco Use  . Smoking status: Former Smoker    Packs/day: 1.00    Years: 20.00    Pack years: 20.00    Types: Cigarettes    Last attempt to quit: 11/14/1994    Years since quitting: 23.4  . Smokeless tobacco: Never Used  Substance and Sexual Activity  . Alcohol use: No    Alcohol/week: 0.0 oz    Comment: previously drank but none for at least 15 years.  . Drug use: No  . Sexual activity: Not on file  Lifestyle  . Physical activity:    Days per week: Not on file    Minutes per session: Not on file  . Stress: Not on file  Relationships  . Social connections:    Talks on phone: Not on file    Gets together: Not on file    Attends religious service: Not on file    Active member of club or organization: Not on file    Attends meetings of clubs or organizations: Not on file    Relationship status: Not on file  . Intimate partner violence:    Fear of current or ex partner: Not on file    Emotionally abused: Not on file    Physically abused: Not on file    Forced sexual activity: Not on file  Other Topics Concern  . Not on file  Social History Narrative   Patient lives at home spouse.   Caffeine Use: Occasionally    FAMILY HISTORY:  Family History  Problem Relation Age of Onset  . Cancer Father        bladder  . Heart disease Father        before age 44  . Hypertension Mother   . Cancer Brother   . Heart disease Brother         before age 15  . Heart disease Sister        before age 27  . Hyperlipidemia Sister   . Hypertension Sister   . Heart attack Sister   . Colon cancer Neg Hx   . Colon polyps Neg Hx  CURRENT MEDICATIONS:  Outpatient Encounter Medications as of 04/16/2018  Medication Sig Note  . albuterol (PROVENTIL HFA;VENTOLIN HFA) 108 (90 Base) MCG/ACT inhaler Inhale 2 puffs into the lungs every 6 (six) hours as needed for wheezing or shortness of breath.   Marland Kitchen albuterol (PROVENTIL) (2.5 MG/3ML) 0.083% nebulizer solution Use via neb q 4 hours prn wheezing   . ALPRAZolam (XANAX) 0.5 MG tablet Take 1-2 tablets (0.5-1 mg total) by mouth at bedtime as needed for anxiety. 03/11/2018: Not provided via pharmacy records  . amLODipine (NORVASC) 10 MG tablet TAKE 1 TABLET (10 MG TOTAL) BY MOUTH EVERY MORNING.   Marland Kitchen amoxicillin-clavulanate (AUGMENTIN) 875-125 MG tablet Take 1 tablet by mouth every 12 (twelve) hours.   . citalopram (CELEXA) 40 MG tablet TAKE 1 TABLET (40 MG TOTAL) BY MOUTH DAILY.   Marland Kitchen clopidogrel (PLAVIX) 75 MG tablet TAKE 1 TABLET BY MOUTH EVERY DAY   . diclofenac sodium (VOLTAREN) 1 % GEL Apply 2 g topically 4 (four) times daily as needed.   . doxycycline (VIBRA-TABS) 100 MG tablet TAKE 1 TABLET BY MOUTH TWICE A DAY FOR 7 DAYS   . doxycycline (VIBRAMYCIN) 100 MG capsule    . ELIQUIS 5 MG TABS tablet TAKE 1 TABLET BY MOUTH TWICE A DAY   . furosemide (LASIX) 20 MG tablet Use sparingly as needed for edema. No more than one tablet by mouth per week. (Patient taking differently: Take 20 mg by mouth daily. )   . HYDROcodone-acetaminophen (NORCO) 7.5-325 MG tablet Take 1 tablet by mouth every 6 (six) hours as needed. for pain   . KLOR-CON M20 20 MEQ tablet TAKE 1 TABLET BY MOUTH 3 TIMES A DAY   . lenalidomide (REVLIMID) 10 MG capsule TAKE 1 CAPSULE BY MOUTH DAILY FOR 7 DAYS ON FOLLOWED BY 7 DAYS OFF. THEN REPEAT CYCLE.   Marland Kitchen levocetirizine (XYZAL) 5 MG tablet Take 5 mg by mouth every evening.    . mometasone  (NASONEX) 50 MCG/ACT nasal spray SPRAY 1 SPRAY INTO EACH NOSTRIL TWICE A DAY   . nitroGLYCERIN (NITROSTAT) 0.4 MG SL tablet Place 1 tablet (0.4 mg total) under the tongue every 5 (five) minutes as needed for chest pain.   Marland Kitchen nystatin cream (MYCOSTATIN)    . omeprazole (PRILOSEC) 20 MG capsule Take 20 mg by mouth daily.   . ondansetron (ZOFRAN) 4 MG tablet TAKE 1 TABLET BY MOUTH EVERY 8 HOURS AS NEEDED FOR SEVERE NAUSEA   . predniSONE (DELTASONE) 20 MG tablet Take 3 tablets by mouth daily X 1 day; then 2 tablets by mouth daily X 2 days; then 1 tablet by mouth daily X 2 days; then 1/2 tablet by mouth daily x 3 days and then stop prednisone.   . simvastatin (ZOCOR) 20 MG tablet TAKE 1 TABLET BY MOUTH EVERYDAY AT BEDTIME    Facility-Administered Encounter Medications as of 04/16/2018  Medication  . acetaminophen (TYLENOL) tablet 650 mg  . sodium chloride 0.9 % injection 10 mL    ALLERGIES:  Allergies  Allergen Reactions  . Morphine And Related Other (See Comments)    hallucinations  . Tape Rash    Paper tape is ok     PHYSICAL EXAM:  ECOG Performance status: 1  Vitals:   04/16/18 0928  BP: 130/76  Pulse: 75  Resp: 18  Temp: 98.4 F (36.9 C)  SpO2: 99%   Filed Weights   04/16/18 0928  Weight: 186 lb (84.4 kg)    Physical Exam Deferred.  LABORATORY DATA:  I have reviewed the labs as listed.  CBC    Component Value Date/Time   WBC 6.4 03/19/2018 0811   RBC 4.31 03/19/2018 0811   HGB 11.8 (L) 03/19/2018 0811   HCT 37.5 (L) 03/19/2018 0811   PLT 147 (L) 03/19/2018 0811   MCV 87.0 03/19/2018 0811   MCH 27.4 03/19/2018 0811   MCHC 31.5 03/19/2018 0811   RDW 15.9 (H) 03/19/2018 0811   LYMPHSABS 2.4 03/19/2018 0811   MONOABS 0.5 03/19/2018 0811   EOSABS 0.2 03/19/2018 0811   BASOSABS 0.0 03/19/2018 0811   CMP Latest Ref Rng & Units 03/19/2018 03/12/2018 03/11/2018  Glucose 65 - 99 mg/dL 164(H) 139(H) 109(H)  BUN 6 - 20 mg/dL '17 18 8  '$ Creatinine 0.61 - 1.24 mg/dL 0.94 1.18  1.09  Sodium 135 - 145 mmol/L 138 134(L) 135  Potassium 3.5 - 5.1 mmol/L 3.8 4.0 4.0  Chloride 101 - 111 mmol/L 106 103 104  CO2 22 - 32 mmol/L 23 21(L) 20(L)  Calcium 8.9 - 10.3 mg/dL 9.0 8.7(L) 9.1  Total Protein 6.5 - 8.1 g/dL 6.7 - -  Total Bilirubin 0.3 - 1.2 mg/dL 0.8 - -  Alkaline Phos 38 - 126 U/L 79 - -  AST 15 - 41 U/L 42(H) - -  ALT 17 - 63 U/L 55 - -        ASSESSMENT & PLAN:   Multiple myeloma in remission 1.  IgG lambda multiple myeloma:  -Status post auto stem cell transplant at [facility] on 08/24/2009 -On Revlimid maintenance 10 mg 7 days on/7 days off since 2011, well-tolerated except some diarrhea.  He is requiring 6 antidiarrhea pills every day.  We discussed the option of cutting back on Revlimid dose to 5 mg 7 days on 7 days of for his tiredness.  He does not want to cut it back at this time. - I discussed the results of SPEP dated 04/13/2018 which did not show M spike.  Immunofixation showed polyclonal gammopathy.  Kappa to lambda ratio is 1.5 with kappa light chains of 50 and lambda light chains of 33.  Skeletal survey on 01/12/2018 negative for lytic lesions.  He will continue Revlimid at the same dose level.  I will see him back in 2 months with repeat blood work.  2.  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  His immunoglobulin IgG trough level was 1053.  He was admitted for 1 day on 03/11/2018 for community-acquired pneumonia and discharged home on Augmentin and steroids.  His breathing is better at this time.  He will continue monthly IVIG.  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  His physical examination did not reveal any adenopathy or splenomegaly.  LDH was normal.  4.  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      Orders placed this encounter:  Orders Placed This Encounter  Procedures  . Protein electrophoresis, serum  . Immunofixation electrophoresis  . Kappa/lambda light  chains  . IgG, IgA, IgM  . Comprehensive metabolic panel  . CBC with Differential  . Lactate dehydrogenase      Derek Jack, MD [place] [phone]

## 2018-04-16 NOTE — Progress Notes (Signed)
Tolerated infusion w/o adverse reaction.  Alert, in no distress.  VSS.  Discharged ambulatory.  

## 2018-04-16 NOTE — Assessment & Plan Note (Signed)
1.  IgG lambda multiple myeloma:  -Status post auto stem cell transplant at [facility] on 08/24/2009 -On Revlimid maintenance 10 mg 7 days on/7 days off since 2011, well-tolerated except some diarrhea.  He is requiring 6 antidiarrhea pills every day.  We discussed the option of cutting back on Revlimid dose to 5 mg 7 days on 7 days of for his tiredness.  He does not want to cut it back at this time. - I discussed the results of SPEP dated 04/13/2018 which did not show M spike.  Immunofixation showed polyclonal gammopathy.  Kappa to lambda ratio is 1.5 with kappa light chains of 50 and lambda light chains of 33.  Skeletal survey on 01/12/2018 negative for lytic lesions.  He will continue Revlimid at the same dose level.  I will see him back in 2 months with repeat blood work.  2.  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  His immunoglobulin IgG trough level was 1053.  He was admitted for 1 day on 03/11/2018 for community-acquired pneumonia and discharged home on Augmentin and steroids.  His breathing is better at this time.  He will continue monthly IVIG.  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  His physical examination did not reveal any adenopathy or splenomegaly.  LDH was normal.  4.  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

## 2018-04-22 LAB — CUP PACEART INCLINIC DEVICE CHECK
Implantable Lead Location: 753860
Lead Channel Setting Pacing Amplitude: 2.5 V
Lead Channel Setting Pacing Pulse Width: 0.6 ms
MDC IDC LEAD IMPLANT DT: 20120910
MDC IDC LEAD IMPLANT DT: 20120910
MDC IDC LEAD LOCATION: 753859
MDC IDC PG IMPLANT DT: 20120910
MDC IDC SESS DTM: 20190612171847
MDC IDC SET LEADCHNL RA PACING AMPLITUDE: 2 V
MDC IDC SET LEADCHNL RV SENSING SENSITIVITY: 0.9 mV

## 2018-04-30 ENCOUNTER — Other Ambulatory Visit (HOSPITAL_COMMUNITY): Payer: Self-pay | Admitting: Respiratory Therapy

## 2018-04-30 DIAGNOSIS — R0602 Shortness of breath: Secondary | ICD-10-CM

## 2018-05-01 ENCOUNTER — Other Ambulatory Visit (HOSPITAL_COMMUNITY): Payer: Self-pay

## 2018-05-01 DIAGNOSIS — C9001 Multiple myeloma in remission: Secondary | ICD-10-CM

## 2018-05-01 MED ORDER — LENALIDOMIDE 10 MG PO CAPS: ORAL_CAPSULE | ORAL | 0 refills | Status: DC

## 2018-05-01 NOTE — Telephone Encounter (Signed)
Received refill request from patients pharmacy for Revlimid. Reviewed with provider, chart checked and refilled.  

## 2018-05-05 ENCOUNTER — Ambulatory Visit: Payer: Medicare Other | Admitting: Family Medicine

## 2018-05-12 ENCOUNTER — Ambulatory Visit (HOSPITAL_COMMUNITY)
Admission: RE | Admit: 2018-05-12 | Discharge: 2018-05-12 | Disposition: A | Discharge status: Home / Self Care | Payer: Medicare Other | Source: Ambulatory Visit | Attending: Internal Medicine | Admitting: Internal Medicine

## 2018-05-12 DIAGNOSIS — Z87891 Personal history of nicotine dependence: Secondary | ICD-10-CM | POA: Insufficient documentation

## 2018-05-12 DIAGNOSIS — R0602 Shortness of breath: Secondary | ICD-10-CM | POA: Diagnosis not present

## 2018-05-12 LAB — PULMONARY FUNCTION TEST
DL/VA % pred: 67 %
DL/VA: 3.01 ml/min/mmHg/L
DLCO unc % pred: 54 %
DLCO unc: 16.23 ml/min/mmHg
FEF 25-75 Pre: 1.37 L/sec
FEF2575-%PRED-PRE: 68 %
FEV1-%PRED-PRE: 77 %
FEV1-Pre: 2.15 L
FEV1FVC-%Pred-Pre: 100 %
FEV6-%Pred-Pre: 81 %
FEV6-PRE: 2.96 L
FEV6FVC-%PRED-PRE: 105 %
FVC-%PRED-PRE: 76 %
FVC-Pre: 2.98 L
Pre FEV1/FVC ratio: 72 %
Pre FEV6/FVC Ratio: 99 %
RV % pred: 101 %
RV: 2.5 L
TLC % PRED: 87 %
TLC: 5.79 L

## 2018-05-15 ENCOUNTER — Ambulatory Visit (HOSPITAL_COMMUNITY): Payer: Medicare Other

## 2018-05-15 ENCOUNTER — Inpatient Hospital Stay (HOSPITAL_COMMUNITY): Payer: Medicare Other | Attending: Hematology

## 2018-05-15 DIAGNOSIS — D801 Nonfamilial hypogammaglobulinemia: Secondary | ICD-10-CM | POA: Diagnosis not present

## 2018-05-15 DIAGNOSIS — C9001 Multiple myeloma in remission: Secondary | ICD-10-CM | POA: Diagnosis not present

## 2018-05-15 LAB — CBC WITH DIFFERENTIAL/PLATELET
Basophils Absolute: 0 10*3/uL (ref 0.0–0.1)
Basophils Relative: 1 %
EOS PCT: 3 %
Eosinophils Absolute: 0.1 10*3/uL (ref 0.0–0.7)
HCT: 37.4 % — ABNORMAL LOW (ref 39.0–52.0)
Hemoglobin: 11.5 g/dL — ABNORMAL LOW (ref 13.0–17.0)
LYMPHS ABS: 2.6 10*3/uL (ref 0.7–4.0)
LYMPHS PCT: 63 %
MCH: 27.3 pg (ref 26.0–34.0)
MCHC: 30.7 g/dL (ref 30.0–36.0)
MCV: 88.6 fL (ref 78.0–100.0)
Monocytes Absolute: 0.3 10*3/uL (ref 0.1–1.0)
Monocytes Relative: 7 %
Neutro Abs: 1.1 10*3/uL — ABNORMAL LOW (ref 1.7–7.7)
Neutrophils Relative %: 26 %
PLATELETS: 86 10*3/uL — AB (ref 150–400)
RBC: 4.22 MIL/uL (ref 4.22–5.81)
RDW: 16.8 % — AB (ref 11.5–15.5)
WBC: 4.1 10*3/uL (ref 4.0–10.5)

## 2018-05-15 LAB — COMPREHENSIVE METABOLIC PANEL
ALT: 16 U/L (ref 0–44)
ANION GAP: 6 (ref 5–15)
AST: 19 U/L (ref 15–41)
Albumin: 3.5 g/dL (ref 3.5–5.0)
Alkaline Phosphatase: 68 U/L (ref 38–126)
BUN: 13 mg/dL (ref 8–23)
CHLORIDE: 108 mmol/L (ref 98–111)
CO2: 26 mmol/L (ref 22–32)
CREATININE: 0.93 mg/dL (ref 0.61–1.24)
Calcium: 8.7 mg/dL — ABNORMAL LOW (ref 8.9–10.3)
Glucose, Bld: 105 mg/dL — ABNORMAL HIGH (ref 70–99)
Potassium: 4.1 mmol/L (ref 3.5–5.1)
SODIUM: 140 mmol/L (ref 135–145)
Total Bilirubin: 0.8 mg/dL (ref 0.3–1.2)
Total Protein: 6.8 g/dL (ref 6.5–8.1)

## 2018-05-15 LAB — LACTATE DEHYDROGENASE: LDH: 124 U/L (ref 98–192)

## 2018-05-16 LAB — IGG, IGA, IGM
IGM (IMMUNOGLOBULIN M), SRM: 29 mg/dL (ref 15–143)
IgA: 439 mg/dL — ABNORMAL HIGH (ref 61–437)
IgG (Immunoglobin G), Serum: 1126 mg/dL (ref 700–1600)

## 2018-05-18 ENCOUNTER — Encounter (HOSPITAL_COMMUNITY): Payer: Self-pay

## 2018-05-18 ENCOUNTER — Inpatient Hospital Stay (HOSPITAL_COMMUNITY): Payer: Medicare Other

## 2018-05-18 VITALS — BP 135/79 | HR 60 | Temp 98.5°F | Resp 18 | Wt 189.7 lb

## 2018-05-18 DIAGNOSIS — E538 Deficiency of other specified B group vitamins: Secondary | ICD-10-CM

## 2018-05-18 DIAGNOSIS — R112 Nausea with vomiting, unspecified: Secondary | ICD-10-CM

## 2018-05-18 DIAGNOSIS — D801 Nonfamilial hypogammaglobulinemia: Secondary | ICD-10-CM

## 2018-05-18 DIAGNOSIS — C9001 Multiple myeloma in remission: Secondary | ICD-10-CM | POA: Diagnosis not present

## 2018-05-18 LAB — IMMUNOFIXATION ELECTROPHORESIS
IGA: 414 mg/dL (ref 61–437)
IGG (IMMUNOGLOBIN G), SERUM: 1069 mg/dL (ref 700–1600)
IGM (IMMUNOGLOBULIN M), SRM: 29 mg/dL (ref 15–143)
Total Protein ELP: 6.4 g/dL (ref 6.0–8.5)

## 2018-05-18 LAB — PROTEIN ELECTROPHORESIS, SERUM
A/G Ratio: 1.1 (ref 0.7–1.7)
Albumin ELP: 3.4 g/dL (ref 2.9–4.4)
Alpha-1-Globulin: 0.2 g/dL (ref 0.0–0.4)
Alpha-2-Globulin: 0.7 g/dL (ref 0.4–1.0)
BETA GLOBULIN: 1 g/dL (ref 0.7–1.3)
GAMMA GLOBULIN: 1.1 g/dL (ref 0.4–1.8)
GLOBULIN, TOTAL: 3 g/dL (ref 2.2–3.9)
Total Protein ELP: 6.4 g/dL (ref 6.0–8.5)

## 2018-05-18 LAB — KAPPA/LAMBDA LIGHT CHAINS
KAPPA, LAMDA LIGHT CHAIN RATIO: 1.76 — AB (ref 0.26–1.65)
Kappa free light chain: 70.5 mg/L — ABNORMAL HIGH (ref 3.3–19.4)
Lambda free light chains: 40 mg/L — ABNORMAL HIGH (ref 5.7–26.3)

## 2018-05-18 MED ORDER — ACETAMINOPHEN 325 MG PO TABS
ORAL_TABLET | ORAL | Status: AC
  Filled 2018-05-18: qty 2

## 2018-05-18 MED ORDER — SODIUM CHLORIDE 0.9 % IJ SOLN
10.0000 mL | INTRAMUSCULAR | Status: DC | PRN
  Administered 2018-05-18: 10 mL
  Filled 2018-05-18: qty 10

## 2018-05-18 MED ORDER — CYANOCOBALAMIN 1000 MCG/ML IJ SOLN
1000.0000 ug | Freq: Once | INTRAMUSCULAR | Status: AC
  Administered 2018-05-18: 1000 ug via INTRAMUSCULAR
  Filled 2018-05-18: qty 1

## 2018-05-18 MED ORDER — ONDANSETRON HCL 4 MG/2ML IJ SOLN
4.0000 mg | Freq: Once | INTRAMUSCULAR | Status: AC
  Administered 2018-05-18: 4 mg via INTRAVENOUS

## 2018-05-18 MED ORDER — ONDANSETRON HCL 4 MG/2ML IJ SOLN
INTRAMUSCULAR | Status: AC
  Filled 2018-05-18: qty 2

## 2018-05-18 MED ORDER — HEPARIN SOD (PORK) LOCK FLUSH 100 UNIT/ML IV SOLN
500.0000 [IU] | Freq: Once | INTRAVENOUS | Status: AC | PRN
  Administered 2018-05-18: 500 [IU]
  Filled 2018-05-18: qty 5

## 2018-05-18 MED ORDER — IMMUNE GLOBULIN (HUMAN) 10 GM/100ML IV SOLN
400.0000 mg/kg | Freq: Once | INTRAVENOUS | Status: AC
Start: 2018-05-18 — End: 2018-05-18
  Administered 2018-05-18: 30 g via INTRAVENOUS
  Filled 2018-05-18: qty 300

## 2018-05-18 MED ORDER — ACETAMINOPHEN 325 MG PO TABS
650.0000 mg | ORAL_TABLET | Freq: Once | ORAL | Status: AC
  Administered 2018-05-18: 650 mg via ORAL

## 2018-05-18 MED ORDER — DEXTROSE 5 % IV SOLN
INTRAVENOUS | Status: DC
  Administered 2018-05-18: 10:00:00 via INTRAVENOUS

## 2018-05-18 MED ORDER — SODIUM CHLORIDE 0.9 % IV SOLN
INTRAVENOUS | Status: DC
  Administered 2018-05-18: 10:00:00 via INTRAVENOUS

## 2018-05-18 NOTE — Patient Instructions (Signed)
[place] at [facility]  Discharge Instructions:  You received a b12 shot today and IVIG today.  _______________________________________________________________  Thank you for choosing [place] at [facility] to provide your oncology and hematology care.  To afford each patient quality time with our providers, please arrive at least 15 minutes before your scheduled appointment.  You need to re-schedule your appointment if you arrive 10 or more minutes late.  We strive to give you quality time with our providers, and arriving late affects you and other patients whose appointments are after yours.  Also, if you no show three or more times for appointments you may be dismissed from the clinic.  Again, thank you for choosing [place] at [place] hope is that these requests will allow you access to exceptional care and in a timely manner. _______________________________________________________________  If you have questions after your visit, please contact our office at [phone] between the hours of 8:30 a.m. and 5:00 p.m. Voicemails left after 4:30 p.m. will not be returned until the following business day. _______________________________________________________________  For prescription refill requests, have your pharmacy contact our office. _______________________________________________________________  Recommendations made by the consultant and any test results will be sent to your referring physician. _______________________________________________________________

## 2018-05-18 NOTE — Progress Notes (Signed)
0945-Patient for IVIG and B12 today. Patient only complains of some dizziness.  Patient warm and dry.  Drove himself to the [facility] for treatment. History of dizziness but does not remember the name of medication he uses at home. Resting comfortable with no s/s of distress noted.   1010-patient sitting up in bed with nausea and vomiting.  Uses Zofran at home for nausea.  Reviewed with Dr. Delton Coombes with verbal order Zofran 4 mg IV for nausea.   1015-patient switched to NACL for Zofran 4 mg IV verbal order Dr. Delton Coombes.  D5W paused.  MAR would not let this reflect for documentation.   1050-Patient stated he felt better and would take his Tylenol premed.  Patient resting with no s/s of distress noted.  Warm and dry.    1121-Patient resting with eyes closed.  Stated he felt better. Warm and dry with no s/s of distress noted.    1124-D5W restarted.  Nacl stopped.    1156-patient stated he was feeling better.  Ambulatory to the bathroom with no complaints voiced.    Patient tolerated IVIG with no complaints voiced.  Stated nausea and dizziness was gone.  Instructed the patient if he started feeling bad again to stop and call for a family member to pick him up with understanding verbalized.  Port site clean and dry with no bruising or swelling noted at site. Good blood return noted before and after administration of therapy.  Band aid applied.  Patient tolerated b12 shot with no complaints voiced.  VSS with discharge and left ambulatory with no s/s of distress noted.

## 2018-05-19 MED ORDER — OCTREOTIDE ACETATE 30 MG IM KIT
PACK | INTRAMUSCULAR | Status: AC
  Filled 2018-05-19: qty 1

## 2018-05-21 DIAGNOSIS — M19011 Primary osteoarthritis, right shoulder: Secondary | ICD-10-CM | POA: Diagnosis not present

## 2018-05-21 DIAGNOSIS — M75121 Complete rotator cuff tear or rupture of right shoulder, not specified as traumatic: Secondary | ICD-10-CM | POA: Diagnosis not present

## 2018-05-21 DIAGNOSIS — M1711 Unilateral primary osteoarthritis, right knee: Secondary | ICD-10-CM | POA: Diagnosis not present

## 2018-05-21 DIAGNOSIS — M1712 Unilateral primary osteoarthritis, left knee: Secondary | ICD-10-CM | POA: Diagnosis not present

## 2018-05-24 DIAGNOSIS — R7301 Impaired fasting glucose: Secondary | ICD-10-CM | POA: Diagnosis not present

## 2018-05-24 DIAGNOSIS — J441 Chronic obstructive pulmonary disease with (acute) exacerbation: Secondary | ICD-10-CM | POA: Diagnosis not present

## 2018-05-24 DIAGNOSIS — Z8572 Personal history of non-Hodgkin lymphomas: Secondary | ICD-10-CM | POA: Diagnosis not present

## 2018-05-24 DIAGNOSIS — Z7901 Long term (current) use of anticoagulants: Secondary | ICD-10-CM | POA: Diagnosis not present

## 2018-05-24 DIAGNOSIS — I10 Essential (primary) hypertension: Secondary | ICD-10-CM | POA: Diagnosis not present

## 2018-05-24 DIAGNOSIS — E782 Mixed hyperlipidemia: Secondary | ICD-10-CM | POA: Diagnosis not present

## 2018-05-25 ENCOUNTER — Encounter (INDEPENDENT_AMBULATORY_CARE_PROVIDER_SITE_OTHER): Payer: Self-pay | Admitting: *Deleted

## 2018-05-25 ENCOUNTER — Encounter (INDEPENDENT_AMBULATORY_CARE_PROVIDER_SITE_OTHER): Payer: Self-pay | Admitting: Internal Medicine

## 2018-05-25 ENCOUNTER — Telehealth (INDEPENDENT_AMBULATORY_CARE_PROVIDER_SITE_OTHER): Payer: Self-pay | Admitting: *Deleted

## 2018-05-25 ENCOUNTER — Ambulatory Visit (INDEPENDENT_AMBULATORY_CARE_PROVIDER_SITE_OTHER): Payer: Medicare Other | Admitting: Internal Medicine

## 2018-05-25 VITALS — BP 150/82 | HR 76 | Temp 97.9°F | Ht 68.0 in | Wt 187.6 lb

## 2018-05-25 DIAGNOSIS — R197 Diarrhea, unspecified: Secondary | ICD-10-CM

## 2018-05-25 DIAGNOSIS — Z860100 Personal history of colon polyps, unspecified: Secondary | ICD-10-CM

## 2018-05-25 DIAGNOSIS — I2510 Atherosclerotic heart disease of native coronary artery without angina pectoris: Secondary | ICD-10-CM | POA: Diagnosis not present

## 2018-05-25 MED ORDER — PEG 3350-KCL-NA BICARB-NACL 420 G PO SOLR: 4000.0000 mL | Freq: Once | ORAL | 0 refills | Status: AC

## 2018-05-25 NOTE — Patient Instructions (Addendum)
Will see back in one year.  The risks of bleeding, perforation and infection were reviewed with patient.

## 2018-05-25 NOTE — Progress Notes (Signed)
Subjective:    Patient ID: Jeremy Parrish, male    DOB: 12-Feb-1941, 77 y.o.   MRN: [id number] Did not bring medications in.  HPI Here today for f/u. Last seen  Last seen in July of 2018. Hx of chronic diarrhea and diverticulitis States he is having 1-2 stools a day. Stools are fool.  Sometimes he has to take a laxative. Appetite is good. Has lost about 5 pounds in a year. (Intentional). No GI complaints. Denies melena or BRRB.  Followed at [facility] once a month.   Hx of atrial fib.  Hx significant for CAD with cardiac stents and maintained on Plavix and Eliquis Hx of CVA and has weakness on rt sided weakness. Walks with a cane. 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 Marcell Anger. 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 In 2011 he was diagnosed with IgG lambda multiple myeloma and treated with 4 cycles of Velcade and dexamethasone followed by autologous peripheral blood stem cell transplant and currently remains on maintenance Revlimid and  takes seven days on and 7 days off for his myeloma.   Hx of colon polyps. Due for colonoscopy    01/01/2013 Colonoscopy with snare polypectomy.  Indications:Patient is 77 year old Caucasian male with multiple medical problems who presents with chronic diarrhea. He has history of complicated sigmoid diverticulitis for which he had surgery in December 2007.  Impression:  Examination performed to cecum. Three small polyps snared from cecum and submitted together. Two small polyps were coagulated as above. Single diverticulum at cecum with few more at descending colon.  Open colonic anastomosis at 20 cm from the anal margin. No evidence of endoscopic colitis.  All 3 cecal polyps are tubular adenomas. 2 other small polyps are  coagulated. Biopsy results reviewed with patient. next colonoscopy in 5 years  Married. 3 children. Retired from [employer].  Review of Systems  Past Medical History:  Diagnosis Date  . Anemia   . Aortic aneurysm of unspecified site without mention of rupture   . Arthritis   . Bladder neck contracture   . Cancer ([place])   . Cerebral atherosclerosis    Carotid Doppler, 02/16/2013 - Bilateral Proximal ICAs,demonstrate mild plaque w/o evidence of significant diameter reduction, dissection, or any other vascular abnormality  . CHF (congestive heart failure) ([place])   . Complication of anesthesia   . COPD (chronic obstructive pulmonary disease) ([place])   . Coronary artery disease   . Depression   . Esophageal reflux   . Heart disease   . Heart murmur   . Hx of bladder cancer 10/07/2012  . Hyperlipidemia   . Hypertension   . 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 Intestinovesical fistula   . Kidney stones    history  . Lung mass   . Multiple myeloma   . Myocardial infarction [facility])    '96  . Non Hodgkin's lymphoma ([place])   . Paroxysmal atrial fibrillation ([place]) 01/02/2016  . Peripheral arterial disease ([place])   . Personal history of other diseases of circulatory system   . PONV (postoperative nausea and vomiting)   . Prostate cancer ([place]) 2000  . Shingles   . Shortness of breath   . Sleep apnea    05-02-14  cpap , not yet used- suggested settings 5  . Stroke [facility]) 2013   Speech.    Past Surgical History:  Procedure Laterality Date  . BLADDER SURGERY    . BONE MARROW TRANSPLANT  2011  . COLON SURGERY     colon resection  . COLONOSCOPY N/A 01/01/2013   Procedure: COLONOSCOPY;  Surgeon: Rogene Houston, MD;  [facility];  Service: Endoscopy;  Laterality: N/A;  825-moved to [place] notified pt  . CORONARY ANGIOPLASTY  06/24/2000   PCI and stenting in mid & proximal RCA  . heart stents x 5  1999   . INGUINAL HERNIA REPAIR Right 05/04/2014   Procedure: OPEN RIGHT INGUINAL HERNIA REPAIR with mesh;  Surgeon: Edward Jolly, MD;  Location: [facility] ORS;  Service: General;  Laterality: Right;  . INSERT / REPLACE / REMOVE PACEMAKER    . left ear skin cancer removed    . NM MYOCAR PERF WALL MOTION  11/27/2007   inferior scar  . PACEMAKER INSERTION  07/22/2011   [employer]tr[employer]  . PORTACATH PLACEMENT  07/26/2009   right chest  . PROSTATE SURGERY    . Rotator    . ROTATOR CUFF REPAIR Right   . SHOULDER ARTHROSCOPY WITH SUBACROMIAL DECOMPRESSION Right 07/21/2013   Procedure: RIGHT SHOULDER ARTHROSCOPY WITH SUBACROMIAL DECOMPRESSION AND DEBRIDEMENT & Injection of Left Shoulder;  Surgeon: Alta [place], MD;  Location: [place];  Service: Orthopedics;  Laterality: Right;  . TEE WITHOUT CARDIOVERSION  10/13/2012   Procedure: TRANSESOPHAGEAL ECHOCARDIOGRAM (TEE);  Surgeon: Sanda Klein, MD;  Location: [facility] ENDOSCOPY;  Service: Cardiovascular;  Laterality: N/A;  pat/kay/echo notified  . US ECHOCARDIOGRAPHY  06/19/2011   RV mildly dilated,mild to mod. MR,mild AI,mild PI  . WRIST SURGERY     right    Allergies  Allergen Reactions  . Morphine And Related Other (See Comments)    hallucinations  . Tape Rash    Paper tape is ok    Current Outpatient Medications on File Prior to Visit  Medication Sig Dispense Refill  . albuterol (PROVENTIL HFA;VENTOLIN HFA) 108 (90 Base) MCG/ACT inhaler Inhale 2 puffs into the lungs every 6 (six) hours as needed for wheezing or shortness of breath. 1 Inhaler 3  . albuterol (PROVENTIL) (2.5 MG/3ML) 0.083% nebulizer solution Use via neb q 4 hours prn wheezing 75 vial 2  . ALPRAZolam (XANAX) 0.5 MG tablet Take 1-2 tablets (0.5-1 mg total) by mouth at bedtime as needed for anxiety. 90 tablet 1  . amLODipine (NORVASC) 10 MG tablet TAKE 1 TABLET (10 MG TOTAL) BY MOUTH EVERY MORNING. 90 tablet 1  . citalopram (CELEXA) 40 MG tablet TAKE 1 TABLET (40 MG TOTAL) BY MOUTH DAILY. 90 tablet  1  . clopidogrel (PLAVIX) 75 MG tablet TAKE 1 TABLET BY MOUTH EVERY DAY 90 tablet 2  . ELIQUIS 5 MG TABS tablet TAKE 1 TABLET BY MOUTH TWICE A DAY (Patient taking differently: TAKE 1 TABLEt daily) 180 tablet 1  . furosemide (LASIX) 20 MG tablet Use sparingly as needed for edema. No more than one tablet by mouth per week. (Patient taking differently: Take 20 mg by mouth as needed. ) 21 tablet 0  . HYDROcodone-acetaminophen (NORCO) 7.5-325 MG tablet Take 1 tablet by mouth every 6 (six) hours as needed. for pain  0  . KLOR-CON M20 20 MEQ tablet TAKE 1 TABLET BY MOUTH 3 TIMES A DAY 270 tablet 3  . lenalidomide (REVLIMID) 10 MG capsule TAKE 1 CAPSULE BY MOUTH DAILY  FOR 7 DAYS ON FOLLOWED BY 7 DAYS OFF. THEN REPEAT CYCLE. 14 capsule 0  . levocetirizine (XYZAL) 5 MG tablet Take 5 mg by mouth every evening.     . nitroGLYCERIN (NITROSTAT) 0.4 MG SL tablet Place 1 tablet (0.4 mg total) under the tongue every 5 (five) minutes as needed for chest pain. 25 tablet 4  . omeprazole (PRILOSEC) 20 MG capsule Take 20 mg by mouth daily.    . ondansetron (ZOFRAN) 4 MG tablet TAKE 1 TABLET BY MOUTH EVERY 8 HOURS AS NEEDED FOR SEVERE NAUSEA  1  . simvastatin (ZOCOR) 20 MG tablet TAKE 1 TABLET BY MOUTH EVERYDAY AT BEDTIME 90 tablet 1   Current Facility-Administered Medications on File Prior to Visit  Medication Dose Route Frequency Provider Last Rate Last Dose  . acetaminophen (TYLENOL) tablet 650 mg  650 mg Oral Q6H PRN Baird Cancer, PA-C   650 mg at 12/09/14 0855  . sodium chloride 0.9 % injection 10 mL  10 mL Intracatheter PRN Baird Cancer, PA-C   10 mL at 12/09/14 0856                Objective:   Physical Exam Blood pressure (!) 150/82, pulse 76, temperature 97.9 F (36.6 C), height 5' 8"  (1.727 m), weight 187 lb 9.6 oz (85.1 kg). Alert and oriented. Skin warm and dry. Oral mucosa is moist.   . Sclera anicteric, conjunctivae is pink. Thyroid not enlarged. No cervical lymphadenopathy. Lungs  clear. Heart regular rate and rhythm.  Abdomen is soft. Bowel sounds are positive. No hepatomegaly. No abdominal masses felt. No tenderness.  No edema to lower extremities.           Assessment & Plan:  Diarrhea. He is doing well. Stools are formed. No GI complaints Hx of colon polyps. Last colonoscopy 2014. Will schedule a colonoscopy. Hx of colon polyps Will see back in one year.

## 2018-05-25 NOTE — Telephone Encounter (Signed)
Patient needs trilyte 

## 2018-05-28 ENCOUNTER — Other Ambulatory Visit: Payer: Self-pay | Admitting: Cardiology

## 2018-05-28 ENCOUNTER — Other Ambulatory Visit: Payer: Self-pay | Admitting: Family Medicine

## 2018-05-28 DIAGNOSIS — E785 Hyperlipidemia, unspecified: Secondary | ICD-10-CM

## 2018-05-28 NOTE — Telephone Encounter (Signed)
Rx sent to pharmacy   

## 2018-06-01 ENCOUNTER — Other Ambulatory Visit (HOSPITAL_COMMUNITY): Payer: Self-pay | Admitting: *Deleted

## 2018-06-01 DIAGNOSIS — C9001 Multiple myeloma in remission: Secondary | ICD-10-CM

## 2018-06-01 MED ORDER — LENALIDOMIDE 10 MG PO CAPS: ORAL_CAPSULE | ORAL | 0 refills | Status: DC

## 2018-06-01 NOTE — Telephone Encounter (Signed)
Chart reviewed, per Dr. Tomie China last office note, revlimid refilled.

## 2018-06-08 ENCOUNTER — Other Ambulatory Visit (HOSPITAL_COMMUNITY): Payer: Medicare Other

## 2018-06-08 DIAGNOSIS — D509 Iron deficiency anemia, unspecified: Secondary | ICD-10-CM | POA: Diagnosis not present

## 2018-06-08 DIAGNOSIS — J441 Chronic obstructive pulmonary disease with (acute) exacerbation: Secondary | ICD-10-CM | POA: Diagnosis not present

## 2018-06-08 DIAGNOSIS — I2510 Atherosclerotic heart disease of native coronary artery without angina pectoris: Secondary | ICD-10-CM | POA: Diagnosis not present

## 2018-06-08 DIAGNOSIS — Z8673 Personal history of transient ischemic attack (TIA), and cerebral infarction without residual deficits: Secondary | ICD-10-CM | POA: Diagnosis not present

## 2018-06-08 DIAGNOSIS — Z950 Presence of cardiac pacemaker: Secondary | ICD-10-CM | POA: Diagnosis not present

## 2018-06-08 DIAGNOSIS — Z8572 Personal history of non-Hodgkin lymphomas: Secondary | ICD-10-CM | POA: Diagnosis not present

## 2018-06-08 DIAGNOSIS — R7301 Impaired fasting glucose: Secondary | ICD-10-CM | POA: Diagnosis not present

## 2018-06-08 DIAGNOSIS — M170 Bilateral primary osteoarthritis of knee: Secondary | ICD-10-CM | POA: Diagnosis not present

## 2018-06-08 DIAGNOSIS — R110 Nausea: Secondary | ICD-10-CM | POA: Diagnosis not present

## 2018-06-08 DIAGNOSIS — E782 Mixed hyperlipidemia: Secondary | ICD-10-CM | POA: Diagnosis not present

## 2018-06-08 DIAGNOSIS — R42 Dizziness and giddiness: Secondary | ICD-10-CM | POA: Diagnosis not present

## 2018-06-08 DIAGNOSIS — M19019 Primary osteoarthritis, unspecified shoulder: Secondary | ICD-10-CM | POA: Diagnosis not present

## 2018-06-12 ENCOUNTER — Other Ambulatory Visit: Payer: Self-pay | Admitting: Family Medicine

## 2018-06-12 ENCOUNTER — Inpatient Hospital Stay (HOSPITAL_COMMUNITY): Payer: Medicare Other | Attending: Hematology

## 2018-06-12 ENCOUNTER — Ambulatory Visit (HOSPITAL_COMMUNITY): Payer: Medicare Other | Admitting: Hematology

## 2018-06-12 ENCOUNTER — Ambulatory Visit (HOSPITAL_COMMUNITY): Payer: Medicare Other

## 2018-06-12 DIAGNOSIS — C9001 Multiple myeloma in remission: Secondary | ICD-10-CM | POA: Insufficient documentation

## 2018-06-12 DIAGNOSIS — D801 Nonfamilial hypogammaglobulinemia: Secondary | ICD-10-CM | POA: Diagnosis not present

## 2018-06-12 DIAGNOSIS — R197 Diarrhea, unspecified: Secondary | ICD-10-CM | POA: Diagnosis not present

## 2018-06-12 LAB — COMPREHENSIVE METABOLIC PANEL
ALBUMIN: 3.6 g/dL (ref 3.5–5.0)
ALT: 16 U/L (ref 0–44)
ANION GAP: 5 (ref 5–15)
AST: 18 U/L (ref 15–41)
Alkaline Phosphatase: 57 U/L (ref 38–126)
BUN: 8 mg/dL (ref 8–23)
CO2: 26 mmol/L (ref 22–32)
Calcium: 8.8 mg/dL — ABNORMAL LOW (ref 8.9–10.3)
Chloride: 107 mmol/L (ref 98–111)
Creatinine, Ser: 1 mg/dL (ref 0.61–1.24)
GFR calc Af Amer: >60 mL/min (ref >60)
GFR calc non Af Amer: >60 mL/min (ref >60)
GLUCOSE: 81 mg/dL (ref 70–99)
Potassium: 3.8 mmol/L (ref 3.5–5.1)
SODIUM: 138 mmol/L (ref 135–145)
TOTAL PROTEIN: 6.7 g/dL (ref 6.5–8.1)
Total Bilirubin: 0.8 mg/dL (ref 0.3–1.2)

## 2018-06-12 LAB — CBC WITH DIFFERENTIAL/PLATELET
BASOS ABS: 0 10*3/uL (ref 0.0–0.1)
Basophils Relative: 0 %
Eosinophils Absolute: 0.1 10*3/uL (ref 0.0–0.7)
Eosinophils Relative: 3 %
HCT: 35.7 % — ABNORMAL LOW (ref 39.0–52.0)
HEMOGLOBIN: 11.3 g/dL — AB (ref 13.0–17.0)
Lymphocytes Relative: 58 %
Lymphs Abs: 2.1 10*3/uL (ref 0.7–4.0)
MCH: 28.1 pg (ref 26.0–34.0)
MCHC: 31.7 g/dL (ref 30.0–36.0)
MCV: 88.8 fL (ref 78.0–100.0)
MONO ABS: 0.3 10*3/uL (ref 0.1–1.0)
MONOS PCT: 7 %
NEUTROS ABS: 1.1 10*3/uL — AB (ref 1.7–7.7)
Neutrophils Relative %: 32 %
Platelets: 93 10*3/uL — ABNORMAL LOW (ref 150–400)
RBC: 4.02 MIL/uL — ABNORMAL LOW (ref 4.22–5.81)
RDW: 17.2 % — AB (ref 11.5–15.5)
WBC: 3.5 10*3/uL — ABNORMAL LOW (ref 4.0–10.5)

## 2018-06-12 LAB — LACTATE DEHYDROGENASE: LDH: 133 U/L (ref 98–192)

## 2018-06-13 LAB — IGG, IGA, IGM
IgA: 349 mg/dL (ref 61–437)
IgG (Immunoglobin G), Serum: 989 mg/dL (ref 700–1600)
IgM (Immunoglobulin M), Srm: 29 mg/dL (ref 15–143)

## 2018-06-15 ENCOUNTER — Encounter (HOSPITAL_COMMUNITY): Payer: Self-pay | Admitting: Hematology

## 2018-06-15 ENCOUNTER — Inpatient Hospital Stay (HOSPITAL_COMMUNITY): Payer: Medicare Other | Attending: Hematology

## 2018-06-15 ENCOUNTER — Inpatient Hospital Stay (HOSPITAL_BASED_OUTPATIENT_CLINIC_OR_DEPARTMENT_OTHER): Payer: Medicare Other | Admitting: Hematology

## 2018-06-15 ENCOUNTER — Other Ambulatory Visit: Payer: Self-pay

## 2018-06-15 VITALS — BP 117/66 | HR 61 | Temp 97.8°F | Resp 18 | Wt 188.0 lb

## 2018-06-15 DIAGNOSIS — I69320 Aphasia following cerebral infarction: Secondary | ICD-10-CM

## 2018-06-15 DIAGNOSIS — D801 Nonfamilial hypogammaglobulinemia: Secondary | ICD-10-CM | POA: Diagnosis not present

## 2018-06-15 DIAGNOSIS — R197 Diarrhea, unspecified: Secondary | ICD-10-CM | POA: Diagnosis not present

## 2018-06-15 DIAGNOSIS — C9000 Multiple myeloma not having achieved remission: Secondary | ICD-10-CM

## 2018-06-15 DIAGNOSIS — Z79899 Other long term (current) drug therapy: Secondary | ICD-10-CM | POA: Diagnosis not present

## 2018-06-15 DIAGNOSIS — E538 Deficiency of other specified B group vitamins: Secondary | ICD-10-CM

## 2018-06-15 DIAGNOSIS — Z8572 Personal history of non-Hodgkin lymphomas: Secondary | ICD-10-CM

## 2018-06-15 DIAGNOSIS — C9001 Multiple myeloma in remission: Secondary | ICD-10-CM

## 2018-06-15 DIAGNOSIS — Z9484 Stem cells transplant status: Secondary | ICD-10-CM | POA: Diagnosis not present

## 2018-06-15 LAB — PROTEIN ELECTROPHORESIS, SERUM
A/G Ratio: 1 (ref 0.7–1.7)
ALPHA-1-GLOBULIN: 0.3 g/dL (ref 0.0–0.4)
ALPHA-2-GLOBULIN: 0.8 g/dL (ref 0.4–1.0)
Albumin ELP: 3.2 g/dL (ref 2.9–4.4)
Beta Globulin: 1.1 g/dL (ref 0.7–1.3)
GAMMA GLOBULIN: 1.1 g/dL (ref 0.4–1.8)
Globulin, Total: 3.3 g/dL (ref 2.2–3.9)
TOTAL PROTEIN ELP: 6.5 g/dL (ref 6.0–8.5)

## 2018-06-15 LAB — IMMUNOFIXATION ELECTROPHORESIS
IgA: 360 mg/dL (ref 61–437)
IgG (Immunoglobin G), Serum: 1015 mg/dL (ref 700–1600)
IgM (Immunoglobulin M), Srm: 27 mg/dL (ref 15–143)
Total Protein ELP: 6.5 g/dL (ref 6.0–8.5)

## 2018-06-15 LAB — KAPPA/LAMBDA LIGHT CHAINS
KAPPA FREE LGHT CHN: 49 mg/L — AB (ref 3.3–19.4)
KAPPA, LAMDA LIGHT CHAIN RATIO: 1.61 (ref 0.26–1.65)
Lambda free light chains: 30.4 mg/L — ABNORMAL HIGH (ref 5.7–26.3)

## 2018-06-15 MED ORDER — ACETAMINOPHEN 325 MG PO TABS
ORAL_TABLET | ORAL | Status: AC
  Filled 2018-06-15: qty 2

## 2018-06-15 MED ORDER — HEPARIN SOD (PORK) LOCK FLUSH 100 UNIT/ML IV SOLN
500.0000 [IU] | Freq: Once | INTRAVENOUS | Status: AC | PRN
  Administered 2018-06-15: 500 [IU]
  Filled 2018-06-15: qty 5

## 2018-06-15 MED ORDER — SODIUM CHLORIDE 0.9 % IJ SOLN
10.0000 mL | INTRAMUSCULAR | Status: DC | PRN
  Administered 2018-06-15: 10 mL
  Filled 2018-06-15: qty 10

## 2018-06-15 MED ORDER — CYANOCOBALAMIN 1000 MCG/ML IJ SOLN
1000.0000 ug | Freq: Once | INTRAMUSCULAR | Status: AC
  Administered 2018-06-15: 1000 ug via INTRAMUSCULAR
  Filled 2018-06-15: qty 1

## 2018-06-15 MED ORDER — IMMUNE GLOBULIN (HUMAN) 10 GM/100ML IV SOLN
400.0000 mg/kg | Freq: Once | INTRAVENOUS | Status: AC
  Administered 2018-06-15: 35 g via INTRAVENOUS
  Filled 2018-06-15: qty 200

## 2018-06-15 MED ORDER — ONDANSETRON 8 MG PO TBDP
8.0000 mg | ORAL_TABLET | Freq: Once | ORAL | Status: AC
  Administered 2018-06-15: 8 mg via ORAL
  Filled 2018-06-15: qty 1

## 2018-06-15 MED ORDER — ACETAMINOPHEN 325 MG PO TABS
650.0000 mg | ORAL_TABLET | Freq: Once | ORAL | Status: AC
  Administered 2018-06-15: 650 mg via ORAL

## 2018-06-15 MED ORDER — DEXTROSE 5 % IV SOLN
INTRAVENOUS | Status: DC
  Administered 2018-06-15: 10:00:00 via INTRAVENOUS

## 2018-06-15 NOTE — Assessment & Plan Note (Signed)
1.  IgG lambda multiple myeloma:  -Status post auto stem cell transplant at [facility] on 08/24/2009 -On Revlimid maintenance 10 mg 7 days on/7 days off since 2011, well-tolerated except some diarrhea.  He is requiring anywhere between 2 to 6 Imodium every day.  - On 05/15/2018 M spike was undetectable.  Free light chain ratio was 1.76.  This was previously 1.5.  Lambda light chains were 40.  I also reviewed his CBC which showed white count of 3.5.  Platelet count was 93 and hemoglobin 11.3.  He will continue Revlimid maintenance 10 mg 7 days on/7 days off.  I will see him back in 2 months with repeat myeloma panel.  2.  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  His immunoglobulin trough levels are satisfactory.  He was admitted for 1 day on 03/11/2018 for community-acquired pneumonia and discharged home on Augmentin and steroids.  He does not report any further infections in the last 2 months. 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  His physical examination did not reveal any adenopathy or splenomegaly.  LDH was normal.  4.  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

## 2018-06-15 NOTE — Patient Instructions (Signed)
[facility] at [facility]  Discharge Instructions:  You received IVIG today.  _______________________________________________________________  Thank you for choosing [facility] at [facility] to provide your oncology and hematology care.  To afford each patient quality time with our providers, please arrive at least 15 minutes before your scheduled appointment.  You need to re-schedule your appointment if you arrive 10 or more minutes late.  We strive to give you quality time with our providers, and arriving late affects you and other patients whose appointments are after yours.  Also, if you no show three or more times for appointments you may be dismissed from the clinic.  Again, thank you for choosing [facility] at [facility]. Our hope is that these requests will allow you access to exceptional care and in a timely manner. _______________________________________________________________  If you have questions after your visit, please contact our office at [phone] between the hours of 8:30 a.m. and 5:00 p.m. Voicemails left after 4:30 p.m. will not be returned until the following business day. _______________________________________________________________  For prescription refill requests, have your pharmacy contact our office. _______________________________________________________________  Recommendations made by the consultant and any test results will be sent to your referring physician. _______________________________________________________________ 

## 2018-06-15 NOTE — Progress Notes (Signed)
[place], [place] [address]   CLINIC:  Medical Oncology/Hematology  PCP:  Celene Squibb, MD [place] [address] [phone]   REASON FOR VISIT:  Follow-up for multiple myeloma and hypogammaglobulinanemia   CURRENT THERAPY: Revlimid 7days on 7 days off   INTERVAL HISTORY:  Jeremy Parrish 77 y.o. male returns for routine follow-up for his multiple myeloma and hypogammaglobulinamenia. Patient is here today with complaints of numbness and tingling in his hands and feet. He states it is stable at this time and mostly only bothers him at night time. Patient states he is having issues with diarrhea and has about 4-5 bowl movements daily. He is taking OTC imodium and it is helping but is needing up to 6 pills a day. He does have leg swelling and has lasix he can use Prn for the swelling. Patient denies any new pains. Denies any SOB. Denies any mouth sores. Denies any fever, night sweats, or chills.     REVIEW OF SYSTEMS:  Review of Systems  Constitutional: Positive for fatigue.  HENT:   Positive for trouble swallowing.   Eyes: Negative.   Respiratory: Negative.   Cardiovascular: Positive for leg swelling.  Gastrointestinal: Positive for diarrhea.  Endocrine: Negative.   Genitourinary: Negative.    Musculoskeletal: Negative.   Skin: Positive for itching.  Neurological: Positive for dizziness and numbness.  Hematological: Bruises/bleeds easily.  Psychiatric/Behavioral: Negative.      PAST MEDICAL/SURGICAL HISTORY:  Past Medical History:  Diagnosis Date  . Anemia   . Aortic aneurysm of unspecified site without mention of rupture   . Arthritis   . Bladder neck contracture   . Cancer ([place])   . Cerebral atherosclerosis    Carotid Doppler, 02/16/2013 - Bilateral Proximal ICAs,demonstrate mild plaque w/o evidence of significant diameter reduction, dissection, or any other vascular abnormality  . CHF (congestive heart failure)  ([place])   . Complication of anesthesia   . COPD (chronic obstructive pulmonary disease) ([place])   . Coronary artery disease   . Depression   . Esophageal reflux   . Heart disease   . Heart murmur   . Hx of bladder cancer 10/07/2012  . Hyperlipidemia   . Hypertension   . 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 Intestinovesical fistula   . Kidney stones    history  . Lung mass   . Multiple myeloma   . Myocardial infarction [facility])    '96  . Non Hodgkin's lymphoma ([place])   . Paroxysmal atrial fibrillation ([place]) 01/02/2016  . Peripheral arterial disease ([place])   . Personal history of other diseases of circulatory system   . PONV (postoperative nausea and vomiting)   . Prostate cancer ([place]) 2000  . Shingles   . Shortness of breath   . Sleep apnea    05-02-14 cpap , not yet used- suggested settings 5  . Stroke [facility]) 2013   Speech.   Past Surgical History:  Procedure Laterality Date  . BLADDER SURGERY    . BONE MARROW TRANSPLANT  2011  . COLON SURGERY     colon resection  . COLONOSCOPY N/A 01/01/2013   Procedure: COLONOSCOPY;  Surgeon: Rogene Houston, MD;  [facility];  Service: Endoscopy;  Laterality: N/A;  825-moved to [place] notified pt  . CORONARY ANGIOPLASTY  06/24/2000   PCI and stenting in mid & proximal RCA  . heart  stents x 5  1999  . INGUINAL HERNIA REPAIR Right 05/04/2014   Procedure: OPEN RIGHT INGUINAL HERNIA REPAIR with mesh;  Surgeon: Edward Jolly, MD;  Location[facility];  Service: General;  Laterality: Right;  . INSERT / REPLACE / REMOVE PACEMAKER    . left ear skin cancer removed    . NM MYOCAR PERF WALL MOTION  11/27/2007   inferior scar  . PACEMAKER INSERTION  07/22/2011   [employer]  . PORTACATH PLACEMENT  07/26/2009   right chest  . PROSTATE SURGERY    . Rotator    . ROTATOR CUFF REPAIR Right   . SHOULDER ARTHROSCOPY WITH SUBACROMIAL DECOMPRESSION Right 07/21/2013   Procedure:  RIGHT SHOULDER ARTHROSCOPY WITH SUBACROMIAL DECOMPRESSION AND DEBRIDEMENT & Injection of Left Shoulder;  Surgeon: Alta [place], MD;  Location: [place];  Service: Orthopedics;  Laterality: Right;  . TEE WITHOUT CARDIOVERSION  10/13/2012   Procedure: TRANSESOPHAGEAL ECHOCARDIOGRAM (TEE);  Surgeon: Sanda Klein, MD;  Location: [facility] ENDOSCOPY;  Service: Cardiovascular;  Laterality: N/A;  pat/kay/echo notified  . US ECHOCARDIOGRAPHY  06/19/2011   RV mildly dilated,mild to mod. MR,mild AI,mild PI  . WRIST SURGERY     right     SOCIAL HISTORY:  Social History   Socioeconomic History  . Marital status: Married    Spouse name: [name]  . Number of children: 3  . Years of education: 9th  . Highest education level: Not on file  Occupational History  . Occupation: retired   Scientific laboratory technician  . Financial resource strain: Not on file  . Food insecurity:    Worry: Not on file    Inability: Not on file  . Transportation needs:    Medical: Not on file    Non-medical: Not on file  Tobacco Use  . Smoking status: Former Smoker    Packs/day: 1.00    Years: 20.00    Pack years: 20.00    Types: Cigarettes    Last attempt to quit: 11/14/1994    Years since quitting: 23.6  . Smokeless tobacco: Never Used  Substance and Sexual Activity  . Alcohol use: No    Alcohol/week: 0.0 oz    Comment: previously drank but none for at least 15 years.  . Drug use: No  . Sexual activity: Not on file  Lifestyle  . Physical activity:    Days per week: Not on file    Minutes per session: Not on file  . Stress: Not on file  Relationships  . Social connections:    Talks on phone: Not on file    Gets together: Not on file    Attends religious service: Not on file    Active member of club or organization: Not on file    Attends meetings of clubs or organizations: Not on file    Relationship status: Not on file  . Intimate partner violence:    Fear of current or ex partner: Not on file    Emotionally abused: Not on  file    Physically abused: Not on file    Forced sexual activity: Not on file  Other Topics Concern  . Not on file  Social History Narrative   Patient lives at home spouse.   Caffeine Use: Occasionally    FAMILY HISTORY:  Family History  Problem Relation Age of Onset  . Cancer Father        bladder  . Heart disease Father        before age 68  .  Hypertension Mother   . Cancer Brother   . Heart disease Brother        before age 39  . Heart disease Sister        before age 61  . Hyperlipidemia Sister   . Hypertension Sister   . Heart attack Sister   . Colon cancer Neg Hx   . Colon polyps Neg Hx     CURRENT MEDICATIONS:  Outpatient Encounter Medications as of 06/15/2018  Medication Sig Note  . albuterol (PROVENTIL HFA;VENTOLIN HFA) 108 (90 Base) MCG/ACT inhaler Inhale 2 puffs into the lungs every 6 (six) hours as needed for wheezing or shortness of breath.   Marland Kitchen albuterol (PROVENTIL) (2.5 MG/3ML) 0.083% nebulizer solution Use via neb q 4 hours prn wheezing   . ALPRAZolam (XANAX) 0.5 MG tablet Take 1-2 tablets (0.5-1 mg total) by mouth at bedtime as needed for anxiety. 03/11/2018: Not provided via pharmacy records  . amLODipine (NORVASC) 10 MG tablet TAKE 1 TABLET BY MOUTH EVERY DAY IN THE MORNING   . ANORO ELLIPTA 62.5-25 MCG/INH AEPB TAKE 1 PUFF BY MOUTH EVERY DAY   . citalopram (CELEXA) 40 MG tablet TAKE 1 TABLET (40 MG TOTAL) BY MOUTH DAILY.   Marland Kitchen clopidogrel (PLAVIX) 75 MG tablet TAKE 1 TABLET BY MOUTH EVERY DAY   . ELIQUIS 5 MG TABS tablet TAKE 1 TABLET BY MOUTH TWICE A DAY (Patient taking differently: TAKE 1 TABLEt daily)   . furosemide (LASIX) 20 MG tablet Use sparingly as needed for edema. No more than one tablet by mouth per week. (Patient taking differently: Take 20 mg by mouth as needed. )   . HYDROcodone-acetaminophen (NORCO/VICODIN) 5-325 MG tablet Take 1 tablet by mouth 2 (two) times daily as needed. for pain   . KLOR-CON M20 20 MEQ tablet TAKE 1 TABLET BY MOUTH 3 TIMES  A DAY   . lenalidomide (REVLIMID) 10 MG capsule TAKE 1 CAPSULE BY MOUTH DAILY FOR 7 DAYS ON FOLLOWED BY 7 DAYS OFF. THEN REPEAT CYCLE.   Marland Kitchen levocetirizine (XYZAL) 5 MG tablet Take 5 mg by mouth every evening.    . mometasone (NASONEX) 50 MCG/ACT nasal spray SPRAY 1 SPRAY INTO EACH NOSTRIL TWICE A DAY   . nitroGLYCERIN (NITROSTAT) 0.4 MG SL tablet Place 1 tablet (0.4 mg total) under the tongue every 5 (five) minutes as needed for chest pain.   Marland Kitchen omeprazole (PRILOSEC) 20 MG capsule Take 20 mg by mouth daily.   . ondansetron (ZOFRAN) 4 MG tablet TAKE 1 TABLET BY MOUTH EVERY 8 HOURS AS NEEDED FOR SEVERE NAUSEA   . simvastatin (ZOCOR) 20 MG tablet TAKE 1 TABLET BY MOUTH EVERYDAY AT BEDTIME   . [DISCONTINUED] HYDROcodone-acetaminophen (NORCO) 7.5-325 MG tablet Take 1 tablet by mouth every 6 (six) hours as needed. for pain    Facility-Administered Encounter Medications as of 06/15/2018  Medication  . acetaminophen (TYLENOL) tablet 650 mg  . sodium chloride 0.9 % injection 10 mL    ALLERGIES:  Allergies  Allergen Reactions  . Morphine And Related Other (See Comments)    hallucinations  . Tape Rash    Paper tape is ok     PHYSICAL EXAM:  ECOG Performance status: 1  VITAL SIGNS: BP: 140/63, P: 72, R:18, TEMP: 97.8, SATS 98%  Physical Exam  Constitutional: He is oriented to person, place, and time.  Cardiovascular: Normal rate, regular rhythm and normal heart sounds.  Pulmonary/Chest: Effort normal and breath sounds normal.  Neurological: He is alert and oriented to  person, place, and time.  Skin: Skin is warm and dry.     LABORATORY DATA:  I have reviewed the labs as listed.  CBC    Component Value Date/Time   WBC 3.5 (L) 06/12/2018 0941   RBC 4.02 (L) 06/12/2018 0941   HGB 11.3 (L) 06/12/2018 0941   HCT 35.7 (L) 06/12/2018 0941   PLT 93 (L) 06/12/2018 0941   MCV 88.8 06/12/2018 0941   MCH 28.1 06/12/2018 0941   MCHC 31.7 06/12/2018 0941   RDW 17.2 (H) 06/12/2018 0941    LYMPHSABS 2.1 06/12/2018 0941   MONOABS 0.3 06/12/2018 0941   EOSABS 0.1 06/12/2018 0941   BASOSABS 0.0 06/12/2018 0941   CMP Latest Ref Rng & Units 06/12/2018 05/15/2018 03/19/2018  Glucose 70 - 99 mg/dL 81 105(H) 164(H)  BUN 8 - 23 mg/dL _0 Creatinine 0.61 - 1.24 mg/dL 1.00 0.93 0.94  Sodium 135 - 145 mmol/L 138 140 138  Potassium 3.5 - 5.1 mmol/L 3.8 4.1 3.8  Chloride 98 - 111 mmol/L 107 108 106  CO2 22 - 32 mmol/L _1 Calcium 8.9 - 10.3 mg/dL 8.8(L) 8.7(L) 9.0  Total Protein 6.5 - 8.1 g/dL 6.7 6.8 6.7  Total Bilirubin 0.3 - 1.2 mg/dL 0.8 0.8 0.8  Alkaline Phos 38 - 126 U/L 57 68 79  AST 15 - 41 U/L 18 19 42(H)  ALT 0 - 44 U/L 16 16 55        ASSESSMENT & PLAN:   Multiple myeloma in remission 1.  IgG lambda multiple myeloma:  -Status post auto stem cell transplant at [facility] on 08/24/2009 -On Revlimid maintenance 10 mg 7 days on/7 days off since 2011, well-tolerated except some diarrhea.  He is requiring anywhere between 2 to 6 Imodium every day.  - On 05/15/2018 M spike was undetectable.  Free light chain ratio was 1.76.  This was previously 1.5.  Lambda light chains were 40.  I also reviewed his CBC which showed white count of 3.5.  Platelet count was 93 and hemoglobin 11.3.  He will continue Revlimid maintenance 10 mg 7 days on/7 days off.  I will see him back in 2 months with repeat myeloma panel.  2.  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  His immunoglobulin trough levels are satisfactory.  He was admitted for 1 day on 03/11/2018 for community-acquired pneumonia and discharged home on Augmentin and steroids.  He does not report any further infections in the last 2 months. 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  His physical examination did not reveal any adenopathy or splenomegaly.  LDH was normal.  4.  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      Orders placed this encounter:    Orders Placed This Encounter  Procedures  . Protein electrophoresis, serum  . Lactate dehydrogenase, isoenzymes  . Immunofixation electrophoresis  . Kappa/lambda light chains  . CBC with Differential/Platelet  . Comprehensive metabolic panel      Derek Jack, MD [place] [phone]

## 2018-06-15 NOTE — Patient Instructions (Addendum)
[place] at [facility] Discharge Instructions  Follow up in 2 months. Please have labs drawn a few days before your visit.    Thank you for choosing [place] at [facility] to provide your oncology and hematology care.  To afford each patient quality time with our provider, please arrive at least 15 minutes before your scheduled appointment time.   If you have a lab appointment with the [facility] please come in thru the  Main Entrance and check in at the main information desk  You need to re-schedule your appointment should you arrive 10 or more minutes late.  We strive to give you quality time with our providers, and arriving late affects you and other patients whose appointments are after yours.  Also, if you no show three or more times for appointments you may be dismissed from the clinic at the providers discretion.     Again, thank you for choosing [facility].  Our hope is that these requests will decrease the amount of time that you wait before being seen by our physicians.       _____________________________________________________________  Should you have questions after your visit to [facility], please contact our office at [phone] between the hours of 8:00 a.m. and 4:30 p.m.  Voicemails left after 4:00 p.m. will not be returned until the following business day.  For prescription refill requests, have your pharmacy contact our office and allow 72 hours.    Cancer Center Support Programs:   > Cancer Support Group  2nd Tuesday of the month 1pm-2pm, Journey Room

## 2018-06-15 NOTE — Progress Notes (Signed)
06/15/18 @ 1025  Patient with nausea during first dose of IVIG, requested order for Zofran ODT 8 mg.  Spoke with Dr Delton Coombes and received telephone order to add Zofran ODT 8 mg to each treatment.  T.O. Dr. Rhys Martini, PharmD

## 2018-06-15 NOTE — Progress Notes (Signed)
Patient to treatment area after oncology follow up visit. Ok to treat today verbal order Dr. Delton Coombes.   Patient tolerated IVIG with no complaints voiced.  Port site clean and dry with no bruising or swelling noted at site.  Good blood return noted before and after administration of therapy.  Band aid applied.  VSS with discharge and left ambulatory with no s/s of distress noted.

## 2018-06-17 ENCOUNTER — Encounter (INDEPENDENT_AMBULATORY_CARE_PROVIDER_SITE_OTHER): Payer: Self-pay | Admitting: *Deleted

## 2018-06-17 NOTE — Telephone Encounter (Signed)
This encounter was created in error - please disregard.

## 2018-06-17 NOTE — Progress Notes (Signed)
Patient aware.

## 2018-06-28 ENCOUNTER — Other Ambulatory Visit: Payer: Self-pay | Admitting: Family Medicine

## 2018-06-30 ENCOUNTER — Other Ambulatory Visit (HOSPITAL_COMMUNITY): Payer: Self-pay | Admitting: *Deleted

## 2018-06-30 DIAGNOSIS — C9001 Multiple myeloma in remission: Secondary | ICD-10-CM

## 2018-06-30 MED ORDER — LENALIDOMIDE 10 MG PO CAPS: ORAL_CAPSULE | ORAL | 0 refills | Status: DC

## 2018-07-03 ENCOUNTER — Other Ambulatory Visit: Payer: Self-pay | Admitting: Family Medicine

## 2018-07-07 ENCOUNTER — Ambulatory Visit (INDEPENDENT_AMBULATORY_CARE_PROVIDER_SITE_OTHER): Payer: Medicare Other | Admitting: *Deleted

## 2018-07-07 DIAGNOSIS — I495 Sick sinus syndrome: Secondary | ICD-10-CM | POA: Diagnosis not present

## 2018-07-07 NOTE — Progress Notes (Signed)
Remote pacemaker transmission.   

## 2018-07-09 ENCOUNTER — Encounter: Payer: Self-pay | Admitting: Cardiology

## 2018-07-10 DIAGNOSIS — G9009 Other idiopathic peripheral autonomic neuropathy: Secondary | ICD-10-CM | POA: Diagnosis not present

## 2018-07-10 DIAGNOSIS — Z6829 Body mass index (BMI) 29.0-29.9, adult: Secondary | ICD-10-CM | POA: Diagnosis not present

## 2018-07-14 ENCOUNTER — Other Ambulatory Visit (HOSPITAL_COMMUNITY): Payer: Medicare Other

## 2018-07-15 ENCOUNTER — Other Ambulatory Visit (HOSPITAL_COMMUNITY): Payer: Self-pay

## 2018-07-15 DIAGNOSIS — D801 Nonfamilial hypogammaglobulinemia: Secondary | ICD-10-CM

## 2018-07-15 DIAGNOSIS — C9000 Multiple myeloma not having achieved remission: Secondary | ICD-10-CM

## 2018-07-16 ENCOUNTER — Ambulatory Visit (HOSPITAL_COMMUNITY)
Admission: RE | Admit: 2018-07-16 | Discharge: 2018-07-16 | Disposition: A | Discharge status: Home / Self Care | Payer: Medicare Other | Source: Ambulatory Visit | Attending: Internal Medicine | Admitting: Internal Medicine

## 2018-07-16 ENCOUNTER — Encounter (HOSPITAL_COMMUNITY): Payer: Self-pay | Admitting: *Deleted

## 2018-07-16 ENCOUNTER — Other Ambulatory Visit: Payer: Self-pay

## 2018-07-16 ENCOUNTER — Encounter (HOSPITAL_COMMUNITY)
Admission: RE | Disposition: A | Discharge status: Home / Self Care | Payer: Self-pay | Source: Ambulatory Visit | Attending: Internal Medicine

## 2018-07-16 DIAGNOSIS — E785 Hyperlipidemia, unspecified: Secondary | ICD-10-CM | POA: Diagnosis not present

## 2018-07-16 DIAGNOSIS — Z860100 Personal history of colon polyps, unspecified: Secondary | ICD-10-CM | POA: Insufficient documentation

## 2018-07-16 DIAGNOSIS — K219 Gastro-esophageal reflux disease without esophagitis: Secondary | ICD-10-CM | POA: Insufficient documentation

## 2018-07-16 DIAGNOSIS — Z7902 Long term (current) use of antithrombotics/antiplatelets: Secondary | ICD-10-CM | POA: Insufficient documentation

## 2018-07-16 DIAGNOSIS — Z8673 Personal history of transient ischemic attack (TIA), and cerebral infarction without residual deficits: Secondary | ICD-10-CM | POA: Insufficient documentation

## 2018-07-16 DIAGNOSIS — I6523 Occlusion and stenosis of bilateral carotid arteries: Secondary | ICD-10-CM | POA: Diagnosis not present

## 2018-07-16 DIAGNOSIS — Z8546 Personal history of malignant neoplasm of prostate: Secondary | ICD-10-CM | POA: Insufficient documentation

## 2018-07-16 DIAGNOSIS — K644 Residual hemorrhoidal skin tags: Secondary | ICD-10-CM | POA: Diagnosis not present

## 2018-07-16 DIAGNOSIS — G4730 Sleep apnea, unspecified: Secondary | ICD-10-CM | POA: Insufficient documentation

## 2018-07-16 DIAGNOSIS — J449 Chronic obstructive pulmonary disease, unspecified: Secondary | ICD-10-CM | POA: Insufficient documentation

## 2018-07-16 DIAGNOSIS — I2510 Atherosclerotic heart disease of native coronary artery without angina pectoris: Secondary | ICD-10-CM | POA: Insufficient documentation

## 2018-07-16 DIAGNOSIS — I252 Old myocardial infarction: Secondary | ICD-10-CM | POA: Insufficient documentation

## 2018-07-16 DIAGNOSIS — Z79899 Other long term (current) drug therapy: Secondary | ICD-10-CM | POA: Insufficient documentation

## 2018-07-16 DIAGNOSIS — D123 Benign neoplasm of transverse colon: Secondary | ICD-10-CM | POA: Insufficient documentation

## 2018-07-16 DIAGNOSIS — I480 Paroxysmal atrial fibrillation: Secondary | ICD-10-CM | POA: Insufficient documentation

## 2018-07-16 DIAGNOSIS — K5730 Diverticulosis of large intestine without perforation or abscess without bleeding: Secondary | ICD-10-CM | POA: Diagnosis not present

## 2018-07-16 DIAGNOSIS — M1990 Unspecified osteoarthritis, unspecified site: Secondary | ICD-10-CM | POA: Diagnosis not present

## 2018-07-16 DIAGNOSIS — Z87891 Personal history of nicotine dependence: Secondary | ICD-10-CM | POA: Insufficient documentation

## 2018-07-16 DIAGNOSIS — Z8551 Personal history of malignant neoplasm of bladder: Secondary | ICD-10-CM | POA: Insufficient documentation

## 2018-07-16 DIAGNOSIS — Z1211 Encounter for screening for malignant neoplasm of colon: Secondary | ICD-10-CM | POA: Insufficient documentation

## 2018-07-16 DIAGNOSIS — Z8249 Family history of ischemic heart disease and other diseases of the circulatory system: Secondary | ICD-10-CM | POA: Insufficient documentation

## 2018-07-16 DIAGNOSIS — R011 Cardiac murmur, unspecified: Secondary | ICD-10-CM | POA: Insufficient documentation

## 2018-07-16 DIAGNOSIS — I110 Hypertensive heart disease with heart failure: Secondary | ICD-10-CM | POA: Diagnosis not present

## 2018-07-16 DIAGNOSIS — Z950 Presence of cardiac pacemaker: Secondary | ICD-10-CM | POA: Insufficient documentation

## 2018-07-16 DIAGNOSIS — I739 Peripheral vascular disease, unspecified: Secondary | ICD-10-CM | POA: Diagnosis not present

## 2018-07-16 DIAGNOSIS — Z980 Intestinal bypass and anastomosis status: Secondary | ICD-10-CM | POA: Insufficient documentation

## 2018-07-16 DIAGNOSIS — Z8572 Personal history of non-Hodgkin lymphomas: Secondary | ICD-10-CM | POA: Insufficient documentation

## 2018-07-16 DIAGNOSIS — Z888 Allergy status to other drugs, medicaments and biological substances status: Secondary | ICD-10-CM | POA: Insufficient documentation

## 2018-07-16 DIAGNOSIS — D125 Benign neoplasm of sigmoid colon: Secondary | ICD-10-CM | POA: Diagnosis not present

## 2018-07-16 DIAGNOSIS — I509 Heart failure, unspecified: Secondary | ICD-10-CM | POA: Insufficient documentation

## 2018-07-16 DIAGNOSIS — Z09 Encounter for follow-up examination after completed treatment for conditions other than malignant neoplasm: Secondary | ICD-10-CM | POA: Diagnosis not present

## 2018-07-16 DIAGNOSIS — Z885 Allergy status to narcotic agent status: Secondary | ICD-10-CM | POA: Insufficient documentation

## 2018-07-16 HISTORY — PX: COLONOSCOPY: SHX5424

## 2018-07-16 HISTORY — PX: POLYPECTOMY: SHX5525

## 2018-07-16 SURGERY — COLONOSCOPY
Anesthesia: Moderate Sedation

## 2018-07-16 MED ORDER — MIDAZOLAM HCL 5 MG/5ML IJ SOLN
INTRAMUSCULAR | Status: AC
  Filled 2018-07-16: qty 10

## 2018-07-16 MED ORDER — MIDAZOLAM HCL 5 MG/5ML IJ SOLN
INTRAMUSCULAR | Status: DC | PRN
  Administered 2018-07-16: 2 mg via INTRAVENOUS
  Administered 2018-07-16: 1 mg via INTRAVENOUS

## 2018-07-16 MED ORDER — SODIUM CHLORIDE 0.9 % IV SOLN
INTRAVENOUS | Status: DC
  Administered 2018-07-16: 12:00:00 via INTRAVENOUS

## 2018-07-16 MED ORDER — STERILE WATER FOR IRRIGATION IR SOLN
Status: DC | PRN
  Administered 2018-07-16: 13:00:00

## 2018-07-16 MED ORDER — MEPERIDINE HCL 50 MG/ML IJ SOLN
INTRAMUSCULAR | Status: DC | PRN
  Administered 2018-07-16 (×2): 25 mg via INTRAVENOUS

## 2018-07-16 MED ORDER — MEPERIDINE HCL 50 MG/ML IJ SOLN
INTRAMUSCULAR | Status: AC
  Filled 2018-07-16: qty 1

## 2018-07-16 NOTE — Discharge Instructions (Signed)
Resume clopidogrel on 07/19/2018 and apixaban on 07/30/2018. Resume other medications as before. Not take aspirin Aleve ibuprofen or similar medications. Resume usual diet. No driving for 24 hours. Physician will call with biopsy results.    Colonoscopy, Adult, Care After This sheet gives you information about how to care for yourself after your procedure. Your doctor may also give you more specific instructions. If you have problems or questions, call your doctor. Follow these instructions at home: General instructions   For the first 24 hours after the procedure: ? Do not drive or use machinery. ? Do not sign important documents. ? Do not drink alcohol. ? Do your daily activities more slowly than normal. ? Eat foods that are soft and easy to digest. ? Rest often.  Take over-the-counter or prescription medicines only as told by your doctor.  It is up to you to get the results of your procedure. Ask your doctor, or the department performing the procedure, when your results will be ready. To help cramping and bloating:  Try walking around.  Put heat on your belly (abdomen) as told by your doctor. Use a heat source that your doctor recommends, such as a moist heat pack or a heating pad. ? Put a towel between your skin and the heat source. ? Leave the heat on for 20-30 minutes. ? Remove the heat if your skin turns bright red. This is especially important if you cannot feel pain, heat, or cold. You can get burned. Eating and drinking  Drink enough fluid to keep your pee (urine) clear or pale yellow.  Return to your normal diet as told by your doctor. Avoid heavy or fried foods that are hard to digest.  Avoid drinking alcohol for as long as told by your doctor. Contact a doctor if:  You have blood in your poop (stool) 2-3 days after the procedure. Get help right away if:  You have more than a small amount of blood in your poop.  You see large clumps of tissue (blood clots) in  your poop.  Your belly is swollen.  You feel sick to your stomach (nauseous).  You throw up (vomit).  You have a fever.  You have belly pain that gets worse, and medicine does not help your pain. This information is not intended to replace advice given to you by your health care provider. Make sure you discuss any questions you have with your health care provider. Document Released: 11/30/2010 Document Revised: 07/22/2016 Document Reviewed: 07/22/2016 Elsevier Interactive Patient Education  2017 [place].    Colon Polyps Polyps are tissue growths inside the body. Polyps can grow in many places, including the large intestine (colon). A polyp may be a round bump or a mushroom-shaped growth. You could have one polyp or several. Most colon polyps are noncancerous (benign). However, some colon polyps can become cancerous over time. What are the causes? The exact cause of colon polyps is not known. What increases the risk? This condition is more likely to develop in people who:  Have a family history of colon cancer or colon polyps.  Are older than 62 or older than 45 if they are African American.  Have inflammatory bowel disease, such as ulcerative colitis or Crohn disease.  Are overweight.  Smoke cigarettes.  Do not get enough exercise.  Drink too much alcohol.  Eat a diet that is: ? High in fat and red meat. ? Low in fiber.  Had childhood cancer that was treated with abdominal radiation.  What are the signs or symptoms? Most polyps do not cause symptoms. If you have symptoms, they may include:  Blood coming from your rectum when having a bowel movement.  Blood in your stool.The stool may look dark red or black.  A change in bowel habits, such as constipation or diarrhea.  How is this diagnosed? This condition is diagnosed with a colonoscopy. This is a procedure that uses a lighted, flexible scope to look at the inside of your colon. How is this  treated? Treatment for this condition involves removing any polyps that are found. Those polyps will then be tested for cancer. If cancer is found, your health care provider will talk to you about options for colon cancer treatment. Follow these instructions at home: Diet  Eat plenty of fiber, such as fruits, vegetables, and whole grains.  Eat foods that are high in calcium and vitamin D, such as milk, cheese, yogurt, eggs, liver, fish, and broccoli.  Limit foods high in fat, red meats, and processed meats, such as hot dogs, sausage, bacon, and lunch meats.  Maintain a healthy weight, or lose weight if recommended by your health care provider. General instructions  Do not smoke cigarettes.  Do not drink alcohol excessively.  Keep all follow-up visits as told by your health care provider. This is important. This includes keeping regularly scheduled colonoscopies. Talk to your health care provider about when you need a colonoscopy.  Exercise every day or as told by your health care provider. Contact a health care provider if:  You have new or worsening bleeding during a bowel movement.  You have new or increased blood in your stool.  You have a change in bowel habits.  You unexpectedly lose weight. This information is not intended to replace advice given to you by your health care provider. Make sure you discuss any questions you have with your health care provider. Document Released: 07/24/2004 Document Revised: 04/04/2016 Document Reviewed: 09/18/2015 Elsevier Interactive Patient Education  [employer].    Diverticulosis Diverticulosis is a condition that develops when small pouches (diverticula) form in the wall of the large intestine (colon). The colon is where water is absorbed and stool is formed. The pouches form when the inside layer of the colon pushes through weak spots in the outer layers of the colon. You may have a few pouches or many of them. What are the  causes? The cause of this condition is not known. What increases the risk? The following factors may make you more likely to develop this condition:  Being older than age 23. Your risk for this condition increases with age. Diverticulosis is rare among people younger than age 2. By age 15, many people have it.  Eating a low-fiber diet.  Having frequent constipation.  Being overweight.  Not getting enough exercise.  Smoking.  Taking over-the-counter pain medicines, like aspirin and ibuprofen.  Having a family history of diverticulosis.  What are the signs or symptoms? In most people, there are no symptoms of this condition. If you do have symptoms, they may include:  Bloating.  Cramps in the abdomen.  Constipation or diarrhea.  Pain in the lower left side of the abdomen.  How is this diagnosed? This condition is most often diagnosed during an exam for other colon problems. Because diverticulosis usually has no symptoms, it often cannot be diagnosed independently. This condition may be diagnosed by:  Using a flexible scope to examine the colon (colonoscopy).  Taking an X-ray of the  colon after dye has been put into the colon (barium enema).  Doing a CT scan.  How is this treated? You may not need treatment for this condition if you have never developed an infection related to diverticulosis. If you have had an infection before, treatment may include:  Eating a high-fiber diet. This may include eating more fruits, vegetables, and grains.  Taking a fiber supplement.  Taking a live bacteria supplement (probiotic).  Taking medicine to relax your colon.  Taking antibiotic medicines.  Follow these instructions at home:  Drink 6-8 glasses of water or more each day to prevent constipation.  Try not to strain when you have a bowel movement.  If you have had an infection before: ? Eat more fiber as directed by your health care provider or your diet and nutrition  specialist (dietitian). ? Take a fiber supplement or probiotic, if your health care provider approves.  Take over-the-counter and prescription medicines only as told by your health care provider.  If you were prescribed an antibiotic, take it as told by your health care provider. Do not stop taking the antibiotic even if you start to feel better.  Keep all follow-up visits as told by your health care provider. This is important. Contact a health care provider if:  You have pain in your abdomen.  You have bloating.  You have cramps.  You have not had a bowel movement in 3 days. Get help right away if:  Your pain gets worse.  Your bloating becomes very bad.  You have a fever or chills, and your symptoms suddenly get worse.  You vomit.  You have bowel movements that are bloody or black.  You have bleeding from your rectum. Summary  Diverticulosis is a condition that develops when small pouches (diverticula) form in the wall of the large intestine (colon).  You may have a few pouches or many of them.  This condition is most often diagnosed during an exam for other colon problems.  If you have had an infection related to diverticulosis, treatment may include increasing the fiber in your diet, taking supplements, or taking medicines. This information is not intended to replace advice given to you by your health care provider. Make sure you discuss any questions you have with your health care provider. Document Released: 07/25/2004 Document Revised: 09/16/2016 Document Reviewed: 09/16/2016 Elsevier Interactive Patient Education  2017 [employer].

## 2018-07-16 NOTE — Op Note (Signed)
[facility] Patient Name: Jeremy Parrish Procedure Date: 07/16/2018 12:01 PM MRN: [id number] Date of Birth: 09/01/1941 Attending MD: Hildred Laser , MD CSN: [id number] Age: 77 Admit Type: Outpatient Procedure:                Colonoscopy Indications:              High risk colon cancer surveillance: Personal                            history of colonic polyps Providers:                Hildred Laser, MD, Otis Peak B. Sharon Seller, RN, Aram Candela Referring MD:             Delphina Cahill, MD Medicines:                Meperidine 50 mg IV, Midazolam 3 mg IV Complications:            No immediate complications. Estimated Blood Loss:     Estimated blood loss was minimal. Procedure:                Pre-Anesthesia Assessment:                           - Prior to the procedure, a History and Physical                            was performed, and patient medications and                            allergies were reviewed. The patient's tolerance of                            previous anesthesia was also reviewed. The risks                            and benefits of the procedure and the sedation                            options and risks were discussed with the patient.                            All questions were answered, and informed consent                            was obtained. Prior Anticoagulants: The patient                            last took Eliquis (apixaban) 3 days and Plavix                            (clopidogrel) 5 days prior to the procedure. ASA  Grade Assessment: III - A patient with severe                            systemic disease. After reviewing the risks and                            benefits, the patient was deemed in satisfactory                            condition to undergo the procedure.                           After obtaining informed consent, the colonoscope                            was passed under direct  vision. Throughout the                            procedure, the patient's blood pressure, pulse, and                            oxygen saturations were monitored continuously. The                            PCF-H190DL ([id number]) scope was introduced through                            the anus and advanced to the the cecum, identified                            by appendiceal orifice and ileocecal valve. The                            colonoscopy was performed without difficulty. The                            patient tolerated the procedure well. The quality                            of the bowel preparation was good. The ileocecal                            valve, appendiceal orifice, and rectum were                            photographed. Scope In: 1:19:53 PM Scope Out: 1:49:12 PM Scope Withdrawal Time: 0 hours 26 minutes 44 seconds  Total Procedure Duration: 0 hours 29 minutes 19 seconds  Findings:      The perianal and digital rectal examinations were normal.      A 12 mm polyp was found in the hepatic flexure. The polyp was flat. The       polyp was removed with a piecemeal technique using a hot snare.       Resection and retrieval were complete. The pathology  specimen was placed       into Bottle Number 1.      Three sessile polyps were found in the sigmoid colon and transverse       colon. The polyps were small in size. These polyps were removed with a       cold snare. Resection and retrieval were complete. The pathology       specimen was placed into Bottle Number 2.      A few small-mouthed diverticula were found in the proximal sigmoid colon.      There was evidence of a prior end-to-end colo-colonic anastomosis at 25       cm proximal to the anus. This was patent.      External hemorrhoids were found during retroflexion. The hemorrhoids       were medium-sized. Impression:               - One 12 mm polyp at the hepatic flexure, removed                            piecemeal  using a hot snare. Resected and retrieved.                           - Three small polyps in the sigmoid colon and in                            the transverse colon, removed with a cold snare.                            Resected and retrieved.                           - Diverticulosis in the proximal sigmoid colon.                           - Patent end-to-end colo-colonic anastomosis.                           - External hemorrhoids. Moderate Sedation:      Moderate (conscious) sedation was administered by the endoscopy nurse       and supervised by the endoscopist. The following parameters were       monitored: oxygen saturation, heart rate, blood pressure, CO2       capnography and response to care. Total physician intraservice time was       36 minutes. Recommendation:           - Patient has a contact number available for                            emergencies. The signs and symptoms of potential                            delayed complications were discussed with the                            patient. Return to normal activities tomorrow.  Written discharge instructions were provided to the                            patient.                           - Resume previous diet today.                           - Continue present medications.                           - Resume Eliquis (apixaban) in 4 days and Plavix                            (clopidogrel) in 3 days at prior doses.                           - Await pathology results.                           - Repeat colonoscopy in 5 years for surveillance. Procedure Code(s):        --- Professional ---                           [phone], Colonoscopy, flexible; with removal of                            tumor(s), polyp(s), or other lesion(s) by snare                            technique                           G0500, Moderate sedation services provided by the                            same physician or  other qualified health care                            professional performing a gastrointestinal                            endoscopic service that sedation supports,                            requiring the presence of an independent trained                            observer to assist in the monitoring of the                            patient's level of consciousness and physiological                            status; initial 15 minutes of  intra-service time;                            patient age 63 years or older (additional time may                            be reported with [phone], as appropriate)                           [phone], Moderate sedation services provided by the                            same physician or other qualified health care                            professional performing the diagnostic or                            therapeutic service that the sedation supports,                            requiring the presence of an independent trained                            observer to assist in the monitoring of the                            patient's level of consciousness and physiological                            status; each additional 15 minutes intraservice                            time (List separately in addition to code for                            primary service) Diagnosis Code(s):        --- Professional ---                           Z86.010, Personal history of colonic polyps                           D12.3, Benign neoplasm of transverse colon (hepatic                            flexure or splenic flexure)                           D12.5, Benign neoplasm of sigmoid colon                           K64.4, Residual hemorrhoidal skin tags                           Z98.0,  Intestinal bypass and anastomosis status                           K57.30, Diverticulosis of large intestine without                            perforation or abscess without  bleeding CPT copyright 2017 [employer]. All rights reserved. The codes documented in this report are preliminary and upon coder review may  be revised to meet current compliance requirements. Hildred Laser, MD Hildred Laser, MD 07/16/2018 2:01:35 PM This report has been signed electronically. Number of Addenda: 0

## 2018-07-16 NOTE — H&P (Signed)
Jeremy Parrish is an 77 y.o. male.   Chief Complaint: Patient is here for colonoscopy. HPI: Patient is 77 year old Caucasian male with multiple medical problems also has history of colonic adenomas and is here for surveillance colonoscopy.  Last exam was in February 2014 with removal of 3 cecal lips and these were tubular adenomas.  Patient denies rectal bleeding.  He has chronic diarrhea for which she takes up to 6 tablets of loperamide daily.  Diarrhea is felt to be due to Revlimid.  He is not losing weight. He has been off Plavix for 5 days and last Eliquis dose was 3 days ago.  Past Medical History:  Diagnosis Date  . Anemia   .    Marland Kitchen Arthritis   . Bladder neck contracture   . Cancer ([place])   . Cerebral atherosclerosis    Carotid Doppler, 02/16/2013 - Bilateral Proximal ICAs,demonstrate mild plaque w/o evidence of significant diameter reduction, dissection, or any other vascular abnormality  . CHF (congestive heart failure) ([place])   . Complication of anesthesia   . COPD (chronic obstructive pulmonary disease) ([place])   . Coronary artery disease   . Depression   . Esophageal reflux   . Heart disease   . Heart murmur   . Hx of bladder cancer 10/07/2012  . Hyperlipidemia   . Hypertension   . 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 Intestinovesical fistula   . Kidney stones    history  . Lung mass   . Multiple myeloma   . Myocardial infarction [facility])    '96  . Non Hodgkin's lymphoma ([place])   . Paroxysmal atrial fibrillation ([place]) 01/02/2016  . Peripheral arterial disease ([place])   . Personal history of other diseases of circulatory system   . PONV (postoperative nausea and vomiting)   . Prostate cancer ([place]) 2000  . Shingles   . Shortness of breath   . Sleep apnea    05-02-14 cpap , not yet used- suggested settings 5  . Stroke [facility]) 2013   Speech.    Past Surgical History:  Procedure Laterality Date  . BLADDER  SURGERY    . BONE MARROW TRANSPLANT  2011  . COLON SURGERY     colon resection  . COLONOSCOPY N/A 01/01/2013   Procedure: COLONOSCOPY;  Surgeon: Rogene Houston, MD;  Location: [facility] ENDO SUITE;  Service: Endoscopy;  Laterality: N/A;  825-moved to [place] notified pt  . CORONARY ANGIOPLASTY  06/24/2000   PCI and stenting in mid & proximal RCA  . heart stents x 5  1999  . INGUINAL HERNIA REPAIR Right 05/04/2014   Procedure: OPEN RIGHT INGUINAL HERNIA REPAIR with mesh;  Surgeon: Edward Jolly, MD;  Location: [facility] ORS;  Service: General;  Laterality: Right;  . INSERT / REPLACE / REMOVE PACEMAKER    . left ear skin cancer removed    . NM MYOCAR PERF WALL MOTION  11/27/2007   inferior scar  . PACEMAKER INSERTION  07/22/2011   [employer]  . PORTACATH PLACEMENT  07/26/2009   right chest  . PROSTATE SURGERY    . Rotator    . ROTATOR CUFF REPAIR Right   . SHOULDER ARTHROSCOPY WITH SUBACROMIAL DECOMPRESSION Right 07/21/2013   Procedure: RIGHT SHOULDER ARTHROSCOPY WITH SUBACROMIAL DECOMPRESSION AND DEBRIDEMENT & Injection of Left Shoulder;  Surgeon: Alta Corning, MD;  Location: [place];  Service: Orthopedics;  Laterality: Right;  . TEE WITHOUT CARDIOVERSION  10/13/2012  Procedure: TRANSESOPHAGEAL ECHOCARDIOGRAM (TEE);  Surgeon: Sanda Klein, MD;  Location: [facility] ENDOSCOPY;  Service: Cardiovascular;  Laterality: N/A;  pat/kay/echo notified  . US ECHOCARDIOGRAPHY  06/19/2011   RV mildly dilated,mild to mod. MR,mild AI,mild PI  . WRIST SURGERY     right    Family History  Problem Relation Age of Onset  . Cancer Father        bladder  . Heart disease Father        before age 35  . Hypertension Mother   . Cancer Brother   . Heart disease Brother        before age 66  . Heart disease Sister        before age 78  . Hyperlipidemia Sister   . Hypertension Sister   . Heart attack Sister   . Colon cancer Neg Hx   . Colon polyps Neg Hx    Social History:  reports that he quit smoking about 23 years  ago. His smoking use included cigarettes. He has a 20.00 pack-year smoking history. He has never used smokeless tobacco. He reports that he does not drink alcohol or use drugs.  Allergies:  Allergies  Allergen Reactions  . Morphine And Related Other (See Comments)    hallucinations  . Tape Rash    Paper tape is ok    Medications Prior to Admission  Medication Sig Dispense Refill  . amLODipine (NORVASC) 10 MG tablet TAKE 1 TABLET BY MOUTH EVERY DAY IN THE MORNING 90 tablet 0  . citalopram (CELEXA) 40 MG tablet TAKE 1 TABLET (40 MG TOTAL) BY MOUTH DAILY. 90 tablet 1  . furosemide (LASIX) 20 MG tablet Use sparingly as needed for edema. No more than one tablet by mouth per week. (Patient taking differently: Take 20 mg by mouth as needed. ) 21 tablet 0  . KLOR-CON M20 20 MEQ tablet TAKE 1 TABLET BY MOUTH 3 TIMES A DAY 270 tablet 3  . lenalidomide (REVLIMID) 10 MG capsule TAKE 1 CAPSULE BY MOUTH DAILY FOR 7 DAYS ON FOLLOWED BY 7 DAYS OFF. THEN REPEAT CYCLE. 14 capsule 0  . levocetirizine (XYZAL) 5 MG tablet Take 5 mg by mouth every evening.     Marland Kitchen omeprazole (PRILOSEC) 20 MG capsule Take 20 mg by mouth daily.    Marland Kitchen omeprazole (PRILOSEC) 20 MG capsule TAKE 1 CAPSULE BY MOUTH EVERY DAY. Needs to have a visit for further refills. 30 capsule 1  . simvastatin (ZOCOR) 20 MG tablet TAKE 1 TABLET BY MOUTH EVERYDAY AT BEDTIME 90 tablet 1  . albuterol (PROVENTIL HFA;VENTOLIN HFA) 108 (90 Base) MCG/ACT inhaler Inhale 2 puffs into the lungs every 6 (six) hours as needed for wheezing or shortness of breath. 1 Inhaler 3  . albuterol (PROVENTIL) (2.5 MG/3ML) 0.083% nebulizer solution Use via neb q 4 hours prn wheezing 75 vial 2  . ALPRAZolam (XANAX) 0.5 MG tablet Take 1-2 tablets (0.5-1 mg total) by mouth at bedtime as needed for anxiety. 90 tablet 1  . ANORO ELLIPTA 62.5-25 MCG/INH AEPB TAKE 1 PUFF BY MOUTH EVERY DAY  5  . clopidogrel (PLAVIX) 75 MG tablet TAKE 1 TABLET BY MOUTH EVERY DAY 90 tablet 2  . ELIQUIS  5 MG TABS tablet TAKE 1 TABLET BY MOUTH TWICE A DAY (Patient taking differently: TAKE 1 TABLEt daily) 180 tablet 1  . HYDROcodone-acetaminophen (NORCO/VICODIN) 5-325 MG tablet Take 1 tablet by mouth 2 (two) times daily as needed. for pain  0  . mometasone (NASONEX) 50  MCG/ACT nasal spray SPRAY 1 SPRAY INTO EACH NOSTRIL TWICE A DAY  1  . nitroGLYCERIN (NITROSTAT) 0.4 MG SL tablet Place 1 tablet (0.4 mg total) under the tongue every 5 (five) minutes as needed for chest pain. 25 tablet 4  . ondansetron (ZOFRAN) 4 MG tablet TAKE 1 TABLET BY MOUTH EVERY 8 HOURS AS NEEDED FOR SEVERE NAUSEA  1    No results found. However, due to the size of the patient record, not all encounters were searched. Please check Results Review for a complete set of results. No results found.  ROS  Blood pressure 133/77, pulse 61, temperature 97.8 F (36.6 C), temperature source Oral, resp. rate 16, height 5' 8"  (1.727 m), weight 85.1 kg, SpO2 98 %. Physical Exam  Constitutional: He appears well-developed and well-nourished.  HENT:  Mouth/Throat: Oropharynx is clear and moist.  Eyes: Conjunctivae are normal. No scleral icterus.  Neck: No thyromegaly present.  Cardiovascular: Normal rate, regular rhythm and normal heart sounds.  No murmur heard. Respiratory: Effort normal and breath sounds normal.  He has PowerPort in the right pectoral region and pacemaker on the left side.  GI:  Abdomen is full with lower midline scar.  Soft and nontender with organomegaly or masses.  Musculoskeletal: He exhibits no edema.  Lymphadenopathy:    He has no cervical adenopathy.  Neurological: He is alert.  Skin: Skin is warm and dry.     Assessment/Plan History of colonic adenomas. Surveillance colonoscopy.  Hildred Laser, MD 07/16/2018, 1:06 PM

## 2018-07-17 ENCOUNTER — Ambulatory Visit (HOSPITAL_COMMUNITY): Payer: Medicare Other

## 2018-07-20 ENCOUNTER — Inpatient Hospital Stay (HOSPITAL_COMMUNITY): Payer: Medicare Other

## 2018-07-20 ENCOUNTER — Inpatient Hospital Stay (HOSPITAL_COMMUNITY): Payer: Medicare Other | Attending: Internal Medicine

## 2018-07-20 VITALS — BP 125/73 | HR 63 | Temp 97.7°F | Resp 18 | Wt 187.0 lb

## 2018-07-20 DIAGNOSIS — D801 Nonfamilial hypogammaglobulinemia: Secondary | ICD-10-CM

## 2018-07-20 DIAGNOSIS — C9002 Multiple myeloma in relapse: Secondary | ICD-10-CM | POA: Diagnosis not present

## 2018-07-20 DIAGNOSIS — E538 Deficiency of other specified B group vitamins: Secondary | ICD-10-CM

## 2018-07-20 DIAGNOSIS — C9000 Multiple myeloma not having achieved remission: Secondary | ICD-10-CM

## 2018-07-20 LAB — CBC WITH DIFFERENTIAL/PLATELET
Basophils Absolute: 0.1 10*3/uL (ref 0.0–0.1)
Basophils Relative: 1 %
Eosinophils Absolute: 0.1 10*3/uL (ref 0.0–0.7)
Eosinophils Relative: 4 %
HEMATOCRIT: 36.6 % — AB (ref 39.0–52.0)
HEMOGLOBIN: 11.7 g/dL — AB (ref 13.0–17.0)
LYMPHS ABS: 2.2 10*3/uL (ref 0.7–4.0)
LYMPHS PCT: 58 %
MCH: 28.3 pg (ref 26.0–34.0)
MCHC: 32 g/dL (ref 30.0–36.0)
MCV: 88.4 fL (ref 78.0–100.0)
Monocytes Absolute: 0.3 10*3/uL (ref 0.1–1.0)
Monocytes Relative: 8 %
NEUTROS ABS: 1.1 10*3/uL — AB (ref 1.7–7.7)
NEUTROS PCT: 29 %
Platelets: 98 10*3/uL — ABNORMAL LOW (ref 150–400)
RBC: 4.14 MIL/uL — AB (ref 4.22–5.81)
RDW: 16 % — ABNORMAL HIGH (ref 11.5–15.5)
WBC: 3.7 10*3/uL — AB (ref 4.0–10.5)

## 2018-07-20 LAB — COMPREHENSIVE METABOLIC PANEL
ALBUMIN: 3.6 g/dL (ref 3.5–5.0)
ALK PHOS: 56 U/L (ref 38–126)
ALT: 15 U/L (ref 0–44)
ANION GAP: 6 (ref 5–15)
AST: 21 U/L (ref 15–41)
BILIRUBIN TOTAL: 0.7 mg/dL (ref 0.3–1.2)
BUN: 13 mg/dL (ref 8–23)
CALCIUM: 9 mg/dL (ref 8.9–10.3)
CO2: 26 mmol/L (ref 22–32)
CREATININE: 1 mg/dL (ref 0.61–1.24)
Chloride: 108 mmol/L (ref 98–111)
GFR calc non Af Amer: >60 mL/min (ref >60)
Glucose, Bld: 125 mg/dL — ABNORMAL HIGH (ref 70–99)
Potassium: 4 mmol/L (ref 3.5–5.1)
SODIUM: 140 mmol/L (ref 135–145)
TOTAL PROTEIN: 6.9 g/dL (ref 6.5–8.1)

## 2018-07-20 LAB — LACTATE DEHYDROGENASE: LDH: 127 U/L (ref 98–192)

## 2018-07-20 MED ORDER — SODIUM CHLORIDE 0.9 % IJ SOLN
10.0000 mL | INTRAMUSCULAR | Status: DC | PRN
  Administered 2018-07-20: 10 mL
  Filled 2018-07-20: qty 10

## 2018-07-20 MED ORDER — SODIUM CHLORIDE 0.9 % IV SOLN: Freq: Once | INTRAVENOUS | Status: DC

## 2018-07-20 MED ORDER — ONDANSETRON 8 MG PO TBDP
8.0000 mg | ORAL_TABLET | Freq: Once | ORAL | Status: DC
  Administered 2018-07-20: 8 mg via ORAL

## 2018-07-20 MED ORDER — CYANOCOBALAMIN 1000 MCG/ML IJ SOLN
1000.0000 ug | Freq: Once | INTRAMUSCULAR | Status: AC
  Administered 2018-07-20: 1000 ug via INTRAMUSCULAR
  Filled 2018-07-20: qty 1

## 2018-07-20 MED ORDER — OCTREOTIDE ACETATE 30 MG IM KIT
PACK | INTRAMUSCULAR | Status: AC
  Filled 2018-07-20: qty 1

## 2018-07-20 MED ORDER — DEXTROSE 5 % IV SOLN
INTRAVENOUS | Status: DC
  Administered 2018-07-20: 09:00:00 via INTRAVENOUS

## 2018-07-20 MED ORDER — ACETAMINOPHEN 325 MG PO TABS
ORAL_TABLET | ORAL | Status: AC
  Filled 2018-07-20: qty 2

## 2018-07-20 MED ORDER — ONDANSETRON 8 MG PO TBDP
ORAL_TABLET | ORAL | Status: AC
  Filled 2018-07-20: qty 1

## 2018-07-20 MED ORDER — ACETAMINOPHEN 325 MG PO TABS
650.0000 mg | ORAL_TABLET | Freq: Once | ORAL | Status: AC
  Administered 2018-07-20: 650 mg via ORAL

## 2018-07-20 MED ORDER — HEPARIN SOD (PORK) LOCK FLUSH 100 UNIT/ML IV SOLN
500.0000 [IU] | Freq: Once | INTRAVENOUS | Status: AC | PRN
  Administered 2018-07-20: 500 [IU]

## 2018-07-20 MED ORDER — IMMUNE GLOBULIN (HUMAN) 10 GM/100ML IV SOLN
400.0000 mg/kg | Freq: Once | INTRAVENOUS | Status: AC
  Administered 2018-07-20: 35 g via INTRAVENOUS
  Filled 2018-07-20: qty 200

## 2018-07-20 NOTE — Progress Notes (Signed)
Jeremy Parrish tolerated IVIG and B12 without incident or complaint. VSS upon completion of treatment. Discharged self ambulatory in satisfactory condition.

## 2018-07-20 NOTE — Patient Instructions (Signed)
[place] at [facility]  Discharge Instructions:  You received IVIG today. Follow up as scheduled. Call clinic for any questions or concerns. _______________________________________________________________  Thank you for choosing [place] at [facility] to provide your oncology and hematology care.  To afford each patient quality time with our providers, please arrive at least 15 minutes before your scheduled appointment.  You need to re-schedule your appointment if you arrive 10 or more minutes late.  We strive to give you quality time with our providers, and arriving late affects you and other patients whose appointments are after yours.  Also, if you no show three or more times for appointments you may be dismissed from the clinic.  Again, thank you for choosing [place] at [place] hope is that these requests will allow you access to exceptional care and in a timely manner. _______________________________________________________________  If you have questions after your visit, please contact our office at [phone] between the hours of 8:30 a.m. and 5:00 p.m. Voicemails left after 4:30 p.m. will not be returned until the following business day. _______________________________________________________________  For prescription refill requests, have your pharmacy contact our office. _______________________________________________________________  Recommendations made by the consultant and any test results will be sent to your referring physician. _______________________________________________________________

## 2018-07-21 ENCOUNTER — Encounter (HOSPITAL_COMMUNITY): Payer: Self-pay | Admitting: Internal Medicine

## 2018-07-21 LAB — PROTEIN ELECTROPHORESIS, SERUM
A/G Ratio: 1.2 (ref 0.7–1.7)
ALBUMIN ELP: 3.4 g/dL (ref 2.9–4.4)
ALPHA-1-GLOBULIN: 0.2 g/dL (ref 0.0–0.4)
ALPHA-2-GLOBULIN: 0.7 g/dL (ref 0.4–1.0)
BETA GLOBULIN: 0.9 g/dL (ref 0.7–1.3)
GAMMA GLOBULIN: 1 g/dL (ref 0.4–1.8)
Globulin, Total: 2.9 g/dL (ref 2.2–3.9)
Total Protein ELP: 6.3 g/dL (ref 6.0–8.5)

## 2018-07-21 LAB — IMMUNOFIXATION ELECTROPHORESIS
IGM (IMMUNOGLOBULIN M), SRM: 26 mg/dL (ref 15–143)
IgA: 380 mg/dL (ref 61–437)
IgG (Immunoglobin G), Serum: 1095 mg/dL (ref 700–1600)
Total Protein ELP: 6.3 g/dL (ref 6.0–8.5)

## 2018-07-22 LAB — KAPPA/LAMBDA LIGHT CHAINS
KAPPA, LAMDA LIGHT CHAIN RATIO: 1.66 — AB (ref 0.26–1.65)
Kappa free light chain: 53.3 mg/L — ABNORMAL HIGH (ref 3.3–19.4)
Lambda free light chains: 32.1 mg/L — ABNORMAL HIGH (ref 5.7–26.3)

## 2018-07-24 DIAGNOSIS — Z6829 Body mass index (BMI) 29.0-29.9, adult: Secondary | ICD-10-CM | POA: Diagnosis not present

## 2018-07-24 DIAGNOSIS — G9009 Other idiopathic peripheral autonomic neuropathy: Secondary | ICD-10-CM | POA: Diagnosis not present

## 2018-07-27 ENCOUNTER — Other Ambulatory Visit (HOSPITAL_COMMUNITY): Payer: Self-pay | Admitting: *Deleted

## 2018-07-27 DIAGNOSIS — C9001 Multiple myeloma in remission: Secondary | ICD-10-CM

## 2018-07-27 MED ORDER — LENALIDOMIDE 10 MG PO CAPS: ORAL_CAPSULE | ORAL | 0 refills | Status: DC

## 2018-07-27 NOTE — Telephone Encounter (Signed)
Chart reviewed, revlimid refilled. 

## 2018-07-28 LAB — CUP PACEART REMOTE DEVICE CHECK
Brady Statistic AP VP Percent: 0.83 %
Brady Statistic AP VS Percent: 94.64 %
Brady Statistic AS VS Percent: 4.52 %
Brady Statistic RA Percent Paced: 93.63 %
Date Time Interrogation Session: 20190827153213
Implantable Lead Implant Date: 20120910
Implantable Lead Location: 753859
Lead Channel Impedance Value: 400 Ohm
Lead Channel Impedance Value: 400 Ohm
Lead Channel Sensing Intrinsic Amplitude: 2.306 mV
Lead Channel Sensing Intrinsic Amplitude: 5.723 mV
Lead Channel Setting Sensing Sensitivity: 0.9 mV
MDC IDC LEAD IMPLANT DT: 20120910
MDC IDC LEAD LOCATION: 753860
MDC IDC MSMT BATTERY VOLTAGE: 2.95 V
MDC IDC PG IMPLANT DT: 20120910
MDC IDC SET LEADCHNL RA PACING AMPLITUDE: 2 V
MDC IDC SET LEADCHNL RV PACING AMPLITUDE: 2.5 V
MDC IDC SET LEADCHNL RV PACING PULSEWIDTH: 0.6 ms
MDC IDC STAT BRADY AS VP PERCENT: 0.01 %
MDC IDC STAT BRADY RV PERCENT PACED: 0.91 %

## 2018-08-12 ENCOUNTER — Inpatient Hospital Stay (HOSPITAL_COMMUNITY): Payer: Medicare Other | Attending: Hematology

## 2018-08-12 DIAGNOSIS — E538 Deficiency of other specified B group vitamins: Secondary | ICD-10-CM | POA: Insufficient documentation

## 2018-08-12 DIAGNOSIS — C9001 Multiple myeloma in remission: Secondary | ICD-10-CM | POA: Diagnosis not present

## 2018-08-12 DIAGNOSIS — D801 Nonfamilial hypogammaglobulinemia: Secondary | ICD-10-CM | POA: Diagnosis not present

## 2018-08-12 DIAGNOSIS — C9000 Multiple myeloma not having achieved remission: Secondary | ICD-10-CM

## 2018-08-12 LAB — COMPREHENSIVE METABOLIC PANEL
ALT: 14 U/L (ref 0–44)
AST: 19 U/L (ref 15–41)
Albumin: 3.5 g/dL (ref 3.5–5.0)
Alkaline Phosphatase: 75 U/L (ref 38–126)
Anion gap: 8 (ref 5–15)
BUN: 10 mg/dL (ref 8–23)
CALCIUM: 9.2 mg/dL (ref 8.9–10.3)
CHLORIDE: 107 mmol/L (ref 98–111)
CO2: 24 mmol/L (ref 22–32)
CREATININE: 1.18 mg/dL (ref 0.61–1.24)
GFR, EST NON AFRICAN AMERICAN: 59 mL/min — AB (ref >60)
Glucose, Bld: 75 mg/dL (ref 70–99)
Potassium: 4.2 mmol/L (ref 3.5–5.1)
Sodium: 139 mmol/L (ref 135–145)
Total Bilirubin: 0.5 mg/dL (ref 0.3–1.2)
Total Protein: 7.2 g/dL (ref 6.5–8.1)

## 2018-08-12 LAB — CBC WITH DIFFERENTIAL/PLATELET
BASOS ABS: 0.1 10*3/uL (ref 0.0–0.1)
BASOS PCT: 1 %
EOS ABS: 0.2 10*3/uL (ref 0.0–0.7)
Eosinophils Relative: 3 %
HEMATOCRIT: 38.2 % — AB (ref 39.0–52.0)
HEMOGLOBIN: 12.2 g/dL — AB (ref 13.0–17.0)
Lymphocytes Relative: 59 %
Lymphs Abs: 3.2 10*3/uL (ref 0.7–4.0)
MCH: 27.9 pg (ref 26.0–34.0)
MCHC: 31.9 g/dL (ref 30.0–36.0)
MCV: 87.4 fL (ref 78.0–100.0)
Monocytes Absolute: 0.5 10*3/uL (ref 0.1–1.0)
Monocytes Relative: 10 %
NEUTROS ABS: 1.4 10*3/uL — AB (ref 1.7–7.7)
Neutrophils Relative %: 27 %
Platelets: 132 10*3/uL — ABNORMAL LOW (ref 150–400)
RBC: 4.37 MIL/uL (ref 4.22–5.81)
RDW: 15.8 % — ABNORMAL HIGH (ref 11.5–15.5)
WBC: 5.3 10*3/uL (ref 4.0–10.5)

## 2018-08-13 LAB — PROTEIN ELECTROPHORESIS, SERUM
A/G Ratio: 1.1 (ref 0.7–1.7)
Albumin ELP: 3.4 g/dL (ref 2.9–4.4)
Alpha-1-Globulin: 0.2 g/dL (ref 0.0–0.4)
Alpha-2-Globulin: 0.8 g/dL (ref 0.4–1.0)
Beta Globulin: 1 g/dL (ref 0.7–1.3)
Gamma Globulin: 1.2 g/dL (ref 0.4–1.8)
Globulin, Total: 3.2 g/dL (ref 2.2–3.9)
Total Protein ELP: 6.6 g/dL (ref 6.0–8.5)

## 2018-08-13 LAB — KAPPA/LAMBDA LIGHT CHAINS
Kappa free light chain: 69.7 mg/L — ABNORMAL HIGH (ref 3.3–19.4)
Kappa, lambda light chain ratio: 1.68 — ABNORMAL HIGH (ref 0.26–1.65)
Lambda free light chains: 41.6 mg/L — ABNORMAL HIGH (ref 5.7–26.3)

## 2018-08-15 LAB — IMMUNOFIXATION ELECTROPHORESIS
IGA: 445 mg/dL — AB (ref 61–437)
IgG (Immunoglobin G), Serum: 1211 mg/dL (ref 700–1600)
IgM (Immunoglobulin M), Srm: 32 mg/dL (ref 15–143)
Total Protein ELP: 6.9 g/dL (ref 6.0–8.5)

## 2018-08-17 ENCOUNTER — Encounter (HOSPITAL_COMMUNITY): Payer: Self-pay | Admitting: Hematology

## 2018-08-17 ENCOUNTER — Inpatient Hospital Stay (HOSPITAL_COMMUNITY): Payer: Medicare Other

## 2018-08-17 ENCOUNTER — Inpatient Hospital Stay (HOSPITAL_BASED_OUTPATIENT_CLINIC_OR_DEPARTMENT_OTHER): Payer: Medicare Other | Admitting: Hematology

## 2018-08-17 VITALS — BP 116/60 | HR 60 | Temp 97.6°F | Resp 16 | Wt 191.6 lb

## 2018-08-17 DIAGNOSIS — D801 Nonfamilial hypogammaglobulinemia: Secondary | ICD-10-CM | POA: Diagnosis not present

## 2018-08-17 DIAGNOSIS — C9001 Multiple myeloma in remission: Secondary | ICD-10-CM | POA: Diagnosis not present

## 2018-08-17 DIAGNOSIS — E538 Deficiency of other specified B group vitamins: Secondary | ICD-10-CM | POA: Diagnosis not present

## 2018-08-17 MED ORDER — HEPARIN SOD (PORK) LOCK FLUSH 100 UNIT/ML IV SOLN
500.0000 [IU] | Freq: Once | INTRAVENOUS | Status: AC | PRN
  Administered 2018-08-17: 500 [IU]

## 2018-08-17 MED ORDER — DEXTROSE 5 % IV SOLN
INTRAVENOUS | Status: DC
  Administered 2018-08-17: 11:00:00 via INTRAVENOUS

## 2018-08-17 MED ORDER — CYANOCOBALAMIN 1000 MCG/ML IJ SOLN
INTRAMUSCULAR | Status: AC
  Filled 2018-08-17: qty 1

## 2018-08-17 MED ORDER — SODIUM CHLORIDE 0.9 % IV SOLN
Freq: Once | INTRAVENOUS | Status: AC
  Administered 2018-08-17: 11:00:00 via INTRAVENOUS
  Filled 2018-08-17: qty 4

## 2018-08-17 MED ORDER — CYANOCOBALAMIN 1000 MCG/ML IJ SOLN
1000.0000 ug | Freq: Once | INTRAMUSCULAR | Status: AC
  Administered 2018-08-17: 1000 ug via INTRAMUSCULAR

## 2018-08-17 MED ORDER — ACETAMINOPHEN 325 MG PO TABS
650.0000 mg | ORAL_TABLET | Freq: Once | ORAL | Status: AC
  Administered 2018-08-17: 650 mg via ORAL

## 2018-08-17 MED ORDER — IMMUNE GLOBULIN (HUMAN) 10 GM/100ML IV SOLN
400.0000 mg/kg | Freq: Once | INTRAVENOUS | Status: AC
  Administered 2018-08-17: 35 g via INTRAVENOUS
  Filled 2018-08-17: qty 50

## 2018-08-17 MED ORDER — ACETAMINOPHEN 325 MG PO TABS
ORAL_TABLET | ORAL | Status: AC
  Filled 2018-08-17: qty 2

## 2018-08-17 MED ORDER — SODIUM CHLORIDE 0.9 % IJ SOLN
10.0000 mL | INTRAMUSCULAR | Status: DC | PRN
  Administered 2018-08-17: 10 mL
  Filled 2018-08-17: qty 10

## 2018-08-17 NOTE — Patient Instructions (Signed)
[facility] at [facility] Discharge Instructions     Thank you for choosing [facility] at [facility] to provide your oncology and hematology care.  To afford each patient quality time with our provider, please arrive at least 15 minutes before your scheduled appointment time.   If you have a lab appointment with the Cancer Center please come in thru the  Main Entrance and check in at the main information desk  You need to re-schedule your appointment should you arrive 10 or more minutes late.  We strive to give you quality time with our providers, and arriving late affects you and other patients whose appointments are after yours.  Also, if you no show three or more times for appointments you may be dismissed from the clinic at the providers discretion.     Again, thank you for choosing [facility].  Our hope is that these requests will decrease the amount of time that you wait before being seen by our physicians.       _____________________________________________________________  Should you have questions after your visit to [facility], please contact our office at [phone] between the hours of 8:00 a.m. and 4:30 p.m.  Voicemails left after 4:00 p.m. will not be returned until the following business day.  For prescription refill requests, have your pharmacy contact our office and allow 72 hours.    Cancer Center Support Programs:   > Cancer Support Group  2nd Tuesday of the month 1pm-2pm, Journey Room    

## 2018-08-17 NOTE — Patient Instructions (Signed)
[facility] Discharge Instructions for Patients Receiving Chemotherapy   Beginning January 23rd 2017 lab work for the [facility] will be done in the  Main lab at [facility] on 1st floor. If you have a lab appointment with the [place] please come in thru the  Main Entrance and check in at the main information desk   Today you received the following: IVIG. Follow up as scheduled. Call clinic for any questions or concerns.   To help prevent nausea and vomiting after your treatment, we encourage you to take your nausea medication   If you develop nausea and vomiting, or diarrhea that is not controlled by your medication, call the clinic.  The clinic phone number is [phone]. Office hours are Monday-Friday 8:30am-5:00pm.  BELOW ARE SYMPTOMS THAT SHOULD BE REPORTED IMMEDIATELY:  *FEVER GREATER THAN 101.0 F  *CHILLS WITH OR WITHOUT FEVER  NAUSEA AND VOMITING THAT IS NOT CONTROLLED WITH YOUR NAUSEA MEDICATION  *UNUSUAL SHORTNESS OF BREATH  *UNUSUAL BRUISING OR BLEEDING  TENDERNESS IN MOUTH AND THROAT WITH OR WITHOUT PRESENCE OF ULCERS  *URINARY PROBLEMS  *BOWEL PROBLEMS  UNUSUAL RASH Items with * indicate a potential emergency and should be followed up as soon as possible. If you have an emergency after office hours please contact your primary care physician or go to the nearest emergency department.  Please call the clinic during office hours if you have any questions or concerns.   You may also contact the Patient Navigator at [phone] should you have any questions or need assistance in obtaining follow up care.      Resources For Cancer Patients and their Caregivers ? American Cancer Society: Can assist with transportation, wigs, general needs, runs Look Good Feel Better.        608-695-5223 ? Cancer Care: Provides financial assistance, online support groups, medication/co-pay assistance.  1-800-813-HOPE 531 644 8664) ? [place] Assists [place] cancer patients and their families through emotional , educational and financial support.  580 393 5525 ? [place] DSS Where to apply for food stamps, Medicaid and utility assistance. 334-550-9387 ? [facility]: Transportation to medical appointments. 807-394-2077 ? Social Security Administration: May apply for disability if have a Stage IV cancer. 6037943127 760-129-5946 ? [employer], Disability and Transit Services: Assists with nutrition, care and transit needs. 979-557-2437

## 2018-08-17 NOTE — Assessment & Plan Note (Signed)
1.  IgG lambda multiple myeloma:  -Status post auto stem cell transplant at [facility] on 08/24/2009 -On Revlimid maintenance 10 mg 7 days on/7 days off since 2011, well-tolerated except some diarrhea.  He is requiring anywhere between 2 to 6 Imodium every day.  - He is tolerating Revlimid very well.  I discussed the results of the SPEP, immunofixation on 08/12/2018 which were negative.  Free light chain ratio is stable at 1.68.  Free lambda light chains are 41.6, kappa light chains are 69.7.  He received Zometa until October 2015. -She will come back in 2 months for follow-up.  2.  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  Immunoglobulin trough levels are satisfactory.  He was last admitted for 1 day on 03/11/2018 for community-acquired pneumonia and discharged home on antibiotics.  He does not report any further infections since then.  I will consider spreading out his IVIG every 6 weeks if he continues to not have infections at next visit.  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  His physical examination did not reveal any adenopathy or splenomegaly.  LDH was normal.  4.  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   5.  Vitamin B12 deficiency: - He is receiving B12 injections monthly.

## 2018-08-17 NOTE — Progress Notes (Signed)
1025 Labs reviewed and patient seen by Dr. Delton Coombes who approved patient for IVIG and B12 today.   Jeremy Parrish tolerated IVIG and B12 without incident or complaint. VSS upon completion of treatment. Discharged self ambulatory in satisfactory condition.

## 2018-08-17 NOTE — Progress Notes (Signed)
[facility], [place] [address]   CLINIC:  Medical Oncology/Hematology  PCP:  Celene Squibb, MD [place] [address] [phone]   REASON FOR VISIT: Follow-up for multiple myeloma and hypogammaglobulinemia   CURRENT THERAPY: Revlimid 7 days on 7 days off   INTERVAL HISTORY:  Jeremy Parrish 77 y.o. male returns for routine follow-up for multiple myeloma and hypogammaglobulinemia. Patient is here doing well with treatment. Patient is experiencing fatigue throughout the day. He has numbness and tingling in his feet that is stable at this time. He reports his appetite at 100% and is maintaining his weight. His energy is 50%. He denies any new pains. Denies any nausea, vomiting or diarrhea. Denies any headaches or visions changes.   REVIEW OF SYSTEMS:  Review of Systems  Constitutional: Positive for fatigue.  Neurological: Positive for numbness.  All other systems reviewed and are negative.    PAST MEDICAL/SURGICAL HISTORY:  Past Medical History:  Diagnosis Date  . Anemia   . Aortic aneurysm of unspecified site without mention of rupture   . Arthritis   . Bladder neck contracture   . Cancer ([place])   . Cerebral atherosclerosis    Carotid Doppler, 02/16/2013 - Bilateral Proximal ICAs,demonstrate mild plaque w/o evidence of significant diameter reduction, dissection, or any other vascular abnormality  . CHF (congestive heart failure) ([place])   . Complication of anesthesia   . COPD (chronic obstructive pulmonary disease) ([place])   . Coronary artery disease   . Depression   . Esophageal reflux   . Heart disease   . Heart murmur   . Hx of bladder cancer 10/07/2012  . Hyperlipidemia   . Hypertension   . 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 Intestinovesical fistula   . Kidney stones    history  . Lung mass   . Multiple myeloma   . Myocardial infarction [facility])     '96  . Non Hodgkin's lymphoma ([place])   . Paroxysmal atrial fibrillation ([place]) 01/02/2016  . Peripheral arterial disease ([place])   . Personal history of other diseases of circulatory system   . PONV (postoperative nausea and vomiting)   . Prostate cancer ([place]) 2000  . Shingles   . Shortness of breath   . Sleep apnea    05-02-14 cpap , not yet used- suggested settings 5  . Stroke [facility]) 2013   Speech.   Past Surgical History:  Procedure Laterality Date  . BLADDER SURGERY    . BONE MARROW TRANSPLANT  2011  . COLON SURGERY     colon resection  . COLONOSCOPY N/A 01/01/2013   Procedure: COLONOSCOPY;  Surgeon: Rogene Houston, MD;  Location: [facility] END[facility];  Service: Endoscopy;  Laterality: N/A;  825-moved to [place] notified pt  . COLONOSCOPY N/A 07/16/2018   Procedure: COLONOSCOPY;  Surgeon: Rogene Houston, MD;  Location: [facility] END[facility] SUITE;  Service: Endoscopy;  Laterality: N/A;  1:25  . CORONARY ANGIOPLASTY  06/24/2000   PCI and stenting in mid & proximal RCA  . heart stents x 5  1999  . INGUINAL HERNIA REPAIR Right 05/04/2014   Procedure: OPEN RIGHT INGUINAL HERNIA REPAIR with mesh;  Surgeon: Edward Jolly, MD;  Location: [facility] ORS;  Service: General;  Laterality: Right;  . INSERT / REPLACE / REMOVE PACEMAKER    . left ear skin cancer removed    . NM MYOCAR PERF  WALL MOTION  11/27/2007   inferior scar  . PACEMAKER INSERTION  07/22/2011   [employer]  . POLYPECTOMY  07/16/2018   Procedure: POLYPECTOMY;  Surgeon: Rogene Houston, MD;  Location: [facility];  Service: Endoscopy;;  colon  . PORTACATH PLACEMENT  07/26/2009   right chest  . PROSTATE SURGERY    . Rotator    . ROTATOR CUFF REPAIR Right   . SHOULDER ARTHROSCOPY WITH SUBACROMIAL DECOMPRESSION Right 07/21/2013   Procedure: RIGHT SHOULDER ARTHROSCOPY WITH SUBACROMIAL DECOMPRESSION AND DEBRIDEMENT & Injection of Left Shoulder;  Surgeon: Alta Corning, MD;  Location: [facility];  Service: Orthopedics;  Laterality: Right;  . TEE WITHOUT  CARDIOVERSION  10/13/2012   Procedure: TRANSESOPHAGEAL ECHOCARDIOGRAM (TEE);  Surgeon: Sanda Klein, MD;  Location: [facility] ENDOSCOPY;  Service: Cardiovascular;  Laterality: N/A;  pat/kay/echo notified  . US ECHOCARDIOGRAPHY  06/19/2011   RV mildly dilated,mild to mod. MR,mild AI,mild PI  . WRIST SURGERY     right     SOCIAL HISTORY:  Social History   Socioeconomic History  . Marital status: Married    Spouse name: [name]  . Number of children: 3  . Years of education: 9th  . Highest education level: Not on file  Occupational History  . Occupation: retired   Scientific laboratory technician  . Financial resource strain: Not on file  . Food insecurity:    Worry: Not on file    Inability: Not on file  . Transportation needs:    Medical: Not on file    Non-medical: Not on file  Tobacco Use  . Smoking status: Former Smoker    Packs/day: 1.00    Years: 20.00    Pack years: 20.00    Types: Cigarettes    Last attempt to quit: 11/14/1994    Years since quitting: 23.7  . Smokeless tobacco: Never Used  Substance and Sexual Activity  . Alcohol use: No    Alcohol/week: 0.0 standard drinks    Comment: previously drank but none for at least 15 years.  . Drug use: No  . Sexual activity: Not on file  Lifestyle  . Physical activity:    Days per week: Not on file    Minutes per session: Not on file  . Stress: Not on file  Relationships  . Social connections:    Talks on phone: Not on file    Gets together: Not on file    Attends religious service: Not on file    Active member of club or organization: Not on file    Attends meetings of clubs or organizations: Not on file    Relationship status: Not on file  . Intimate partner violence:    Fear of current or ex partner: Not on file    Emotionally abused: Not on file    Physically abused: Not on file    Forced sexual activity: Not on file  Other Topics Concern  . Not on file  Social History Narrative   Patient lives at home spouse.   Caffeine Use:  Occasionally    FAMILY HISTORY:  Family History  Problem Relation Age of Onset  . Cancer Father        bladder  . Heart disease Father        before age 1  . Hypertension Mother   . Cancer Brother   . Heart disease Brother        before age 19  . Heart disease Sister  before age 42  . Hyperlipidemia Sister   . Hypertension Sister   . Heart attack Sister   . Colon cancer Neg Hx   . Colon polyps Neg Hx     CURRENT MEDICATIONS:  Outpatient Encounter Medications as of 08/17/2018  Medication Sig Note  . albuterol (PROVENTIL HFA;VENTOLIN HFA) 108 (90 Base) MCG/ACT inhaler Inhale 2 puffs into the lungs every 6 (six) hours as needed for wheezing or shortness of breath.   Marland Kitchen albuterol (PROVENTIL) (2.5 MG/3ML) 0.083% nebulizer solution Use via neb q 4 hours prn wheezing   . amLODipine (NORVASC) 10 MG tablet TAKE 1 TABLET BY MOUTH EVERY DAY IN THE MORNING   . apixaban (ELIQUIS) 5 MG TABS tablet Take by mouth.   . citalopram (CELEXA) 40 MG tablet TAKE 1 TABLET (40 MG TOTAL) BY MOUTH DAILY.   Marland Kitchen clopidogrel (PLAVIX) 75 MG tablet Take 1 tablet (75 mg total) by mouth daily.   . furosemide (LASIX) 20 MG tablet Use sparingly as needed for edema. No more than one tablet by mouth per week. (Patient taking differently: Take 20 mg by mouth as needed. )   . HYDROcodone-acetaminophen (NORCO/VICODIN) 5-325 MG tablet Take 1 tablet by mouth 2 (two) times daily as needed. for pain   . KLOR-CON M20 20 MEQ tablet TAKE 1 TABLET BY MOUTH 3 TIMES A DAY   . lenalidomide (REVLIMID) 10 MG capsule TAKE 1 CAPSULE BY MOUTH DAILY FOR 7 DAYS ON FOLLOWED BY 7 DAYS OFF. THEN REPEAT CYCLE.   Marland Kitchen levocetirizine (XYZAL) 5 MG tablet Take 5 mg by mouth every evening.    . mometasone (NASONEX) 50 MCG/ACT nasal spray SPRAY 1 SPRAY INTO EACH NOSTRIL TWICE A DAY   . nitroGLYCERIN (NITROSTAT) 0.4 MG SL tablet Place 1 tablet (0.4 mg total) under the tongue every 5 (five) minutes as needed for chest pain.   Marland Kitchen omeprazole  (PRILOSEC) 20 MG capsule Take 20 mg by mouth daily.   . ondansetron (ZOFRAN) 4 MG tablet TAKE 1 TABLET BY MOUTH EVERY 8 HOURS AS NEEDED FOR SEVERE NAUSEA   . simvastatin (ZOCOR) 20 MG tablet TAKE 1 TABLET BY MOUTH EVERYDAY AT BEDTIME   . [DISCONTINUED] albuterol (PROVENTIL) (2.5 MG/3ML) 0.083% nebulizer solution Use via neb q 4 hours prn wheezing   . [DISCONTINUED] ALPRAZolam (XANAX) 0.5 MG tablet Take 1-2 tablets (0.5-1 mg total) by mouth at bedtime as needed for anxiety. 03/11/2018: Not provided via pharmacy records  . [DISCONTINUED] ANORO ELLIPTA 62.5-25 MCG/INH AEPB TAKE 1 PUFF BY MOUTH EVERY DAY   . [DISCONTINUED] apixaban (ELIQUIS) 5 MG TABS tablet Take 1 tablet (5 mg total) by mouth 2 (two) times daily.   . [DISCONTINUED] budesonide (PULMICORT) 0.5 MG/2ML nebulizer solution Take 2 mLs (0.5 mg total) by nebulization 2 (two) times daily.   . [DISCONTINUED] celecoxib (CELEBREX) 100 MG capsule Take by mouth.   . [DISCONTINUED] furosemide (LASIX) 20 MG tablet Use sparingly prn edema no more than one per week   . [DISCONTINUED] nitroGLYCERIN (NITROSTAT) 0.4 MG SL tablet Place under the tongue.   . [DISCONTINUED] oxyCODONE-acetaminophen (PERCOCET/ROXICET) 5-325 MG tablet Take by mouth.   . [DISCONTINUED] potassium chloride SA (K-DUR,KLOR-CON) 20 MEQ tablet Take by mouth.    Facility-Administered Encounter Medications as of 08/17/2018  Medication  . acetaminophen (TYLENOL) tablet 650 mg  . sodium chloride 0.9 % injection 10 mL    ALLERGIES:  Allergies  Allergen Reactions  . Morphine And Related Other (See Comments)    hallucinations  . Tape  Rash    Paper tape is ok     PHYSICAL EXAM:  ECOG Performance status: 1  VITAL SIGNS:BP:134/74, P:80, R:16, T:97.9, SATS:98% Weight:191.6  Physical Exam  Constitutional: He is oriented to person, place, and time. He appears well-developed and well-nourished.  Cardiovascular: Normal rate, regular rhythm and normal heart sounds.  Pulmonary/Chest:  Effort normal and breath sounds normal.  Musculoskeletal: Normal range of motion.  Neurological: He is oriented to person, place, and time.  Skin: Skin is warm and dry.  Psychiatric: He has a normal mood and affect. His behavior is normal. Judgment and thought content normal.     LABORATORY DATA:  I have reviewed the labs as listed.  CBC    Component Value Date/Time   WBC 5.3 08/12/2018 1042   RBC 4.37 08/12/2018 1042   HGB 12.2 (L) 08/12/2018 1042   HCT 38.2 (L) 08/12/2018 1042   PLT 132 (L) 08/12/2018 1042   MCV 87.4 08/12/2018 1042   MCH 27.9 08/12/2018 1042   MCHC 31.9 08/12/2018 1042   RDW 15.8 (H) 08/12/2018 1042   LYMPHSABS 3.2 08/12/2018 1042   MONOABS 0.5 08/12/2018 1042   EOSABS 0.2 08/12/2018 1042   BASOSABS 0.1 08/12/2018 1042   CMP Latest Ref Rng & Units 08/12/2018 07/20/2018 06/12/2018  Glucose 70 - 99 mg/dL 75 125(H) 81  BUN 8 - 23 mg/dL 10 13 8   Creatinine 0.61 - 1.24 mg/dL 1.18 1.00 1.00  Sodium 135 - 145 mmol/L 139 140 138  Potassium 3.5 - 5.1 mmol/L 4.2 4.0 3.8  Chloride 98 - 111 mmol/L 107 108 107  CO2 22 - 32 mmol/L 24 26 26   Calcium 8.9 - 10.3 mg/dL 9.2 9.0 8.8(L)  Total Protein 6.5 - 8.1 g/dL 7.2 6.9 6.7  Total Bilirubin 0.3 - 1.2 mg/dL 0.5 0.7 0.8  Alkaline Phos 38 - 126 U/L 75 56 57  AST 15 - 41 U/L 19 21 18   ALT 0 - 44 U/L 14 15 16          ASSESSMENT & PLAN:   Multiple myeloma in remission 1.  IgG lambda multiple myeloma:  -Status post auto stem cell transplant at [facility] on 08/24/2009 -On Revlimid maintenance 10 mg 7 days on/7 days off since 2011, well-tolerated except some diarrhea.  He is requiring anywhere between 2 to 6 Imodium every day.  - He is tolerating Revlimid very well.  I discussed the results of the SPEP, immunofixation on 08/12/2018 which were negative.  Free light chain ratio is stable at 1.68.  Free lambda light chains are 41.6, kappa light chains are 69.7.  He received Zometa until October 2015. -She will  come back in 2 months for follow-up.  2.  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  Immunoglobulin trough levels are satisfactory.  He was last admitted for 1 day on 03/11/2018 for community-acquired pneumonia and discharged home on antibiotics.  He does not report any further infections since then.  I will consider spreading out his IVIG every 6 weeks if he continues to not have infections at next visit.  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  His physical examination did not reveal any adenopathy or splenomegaly.  LDH was normal.  4.  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   5.  Vitamin B12 deficiency: - He is receiving B12 injections monthly.      Orders placed this encounter:  Orders Placed This Encounter  Procedures  . Lactate dehydrogenase  . Protein electrophoresis, serum  . Immunofixation electrophoresis  . Kappa/lambda light chains  . CBC with Differential/Platelet  . Comprehensive metabolic panel  . IgG, IgA, IgM      Derek Jack, MD [place] [phone]

## 2018-08-18 LAB — LACTATE DEHYDROGENASE, ISOENZYMES
LDH 1: 24 % (ref 17–32)
LDH 2: 39 % (ref 25–40)
LDH 3: 24 % (ref 17–27)
LDH 4: 7 % (ref 5–13)
LDH 5: 6 % (ref 4–20)
LDH ISOENZYMES, TOTAL: 167 IU/L (ref 121–224)

## 2018-08-20 DIAGNOSIS — Z23 Encounter for immunization: Secondary | ICD-10-CM | POA: Diagnosis not present

## 2018-08-31 ENCOUNTER — Other Ambulatory Visit: Payer: Self-pay | Admitting: Cardiovascular Disease

## 2018-08-31 ENCOUNTER — Other Ambulatory Visit (HOSPITAL_COMMUNITY): Payer: Self-pay | Admitting: *Deleted

## 2018-08-31 DIAGNOSIS — C9001 Multiple myeloma in remission: Secondary | ICD-10-CM

## 2018-08-31 MED ORDER — LENALIDOMIDE 10 MG PO CAPS: ORAL_CAPSULE | ORAL | 0 refills | Status: DC

## 2018-08-31 NOTE — Telephone Encounter (Signed)
Chart reviewed, revlimid refilled. 

## 2018-09-01 ENCOUNTER — Other Ambulatory Visit: Payer: Self-pay | Admitting: Family Medicine

## 2018-09-08 DIAGNOSIS — Z6829 Body mass index (BMI) 29.0-29.9, adult: Secondary | ICD-10-CM | POA: Diagnosis not present

## 2018-09-08 DIAGNOSIS — J441 Chronic obstructive pulmonary disease with (acute) exacerbation: Secondary | ICD-10-CM | POA: Diagnosis not present

## 2018-09-08 DIAGNOSIS — R531 Weakness: Secondary | ICD-10-CM | POA: Diagnosis not present

## 2018-09-08 DIAGNOSIS — R0602 Shortness of breath: Secondary | ICD-10-CM | POA: Diagnosis not present

## 2018-09-08 DIAGNOSIS — R05 Cough: Secondary | ICD-10-CM | POA: Diagnosis not present

## 2018-09-14 ENCOUNTER — Other Ambulatory Visit (HOSPITAL_COMMUNITY): Payer: Self-pay | Admitting: *Deleted

## 2018-09-14 DIAGNOSIS — C9000 Multiple myeloma not having achieved remission: Secondary | ICD-10-CM

## 2018-09-15 ENCOUNTER — Inpatient Hospital Stay (HOSPITAL_COMMUNITY): Payer: Medicare Other | Attending: Hematology

## 2018-09-15 DIAGNOSIS — J441 Chronic obstructive pulmonary disease with (acute) exacerbation: Secondary | ICD-10-CM | POA: Diagnosis not present

## 2018-09-15 DIAGNOSIS — E538 Deficiency of other specified B group vitamins: Secondary | ICD-10-CM | POA: Insufficient documentation

## 2018-09-15 DIAGNOSIS — C9000 Multiple myeloma not having achieved remission: Secondary | ICD-10-CM

## 2018-09-15 DIAGNOSIS — D801 Nonfamilial hypogammaglobulinemia: Secondary | ICD-10-CM | POA: Diagnosis not present

## 2018-09-15 DIAGNOSIS — R0602 Shortness of breath: Secondary | ICD-10-CM | POA: Diagnosis not present

## 2018-09-15 LAB — COMPREHENSIVE METABOLIC PANEL
ALK PHOS: 67 U/L (ref 38–126)
ALT: 24 U/L (ref 0–44)
ANION GAP: 7 (ref 5–15)
AST: 18 U/L (ref 15–41)
Albumin: 3.4 g/dL — ABNORMAL LOW (ref 3.5–5.0)
BUN: 18 mg/dL (ref 8–23)
CALCIUM: 9 mg/dL (ref 8.9–10.3)
CHLORIDE: 104 mmol/L (ref 98–111)
CO2: 25 mmol/L (ref 22–32)
Creatinine, Ser: 0.96 mg/dL (ref 0.61–1.24)
GFR calc non Af Amer: >60 mL/min (ref >60)
Glucose, Bld: 134 mg/dL — ABNORMAL HIGH (ref 70–99)
Potassium: 4.1 mmol/L (ref 3.5–5.1)
SODIUM: 136 mmol/L (ref 135–145)
Total Bilirubin: 0.6 mg/dL (ref 0.3–1.2)
Total Protein: 6.7 g/dL (ref 6.5–8.1)

## 2018-09-15 LAB — CBC WITH DIFFERENTIAL/PLATELET
Abs Immature Granulocytes: 0.05 10*3/uL (ref 0.00–0.07)
BASOS PCT: 0 %
Basophils Absolute: 0 10*3/uL (ref 0.0–0.1)
EOS ABS: 0.1 10*3/uL (ref 0.0–0.5)
EOS PCT: 1 %
HCT: 39.6 % (ref 39.0–52.0)
Hemoglobin: 12.2 g/dL — ABNORMAL LOW (ref 13.0–17.0)
Immature Granulocytes: 1 %
Lymphocytes Relative: 41 %
Lymphs Abs: 2.5 10*3/uL (ref 0.7–4.0)
MCH: 27.4 pg (ref 26.0–34.0)
MCHC: 30.8 g/dL (ref 30.0–36.0)
MCV: 88.8 fL (ref 80.0–100.0)
MONO ABS: 0.9 10*3/uL (ref 0.1–1.0)
Monocytes Relative: 16 %
Neutro Abs: 2.5 10*3/uL (ref 1.7–7.7)
Neutrophils Relative %: 41 %
PLATELETS: 155 10*3/uL (ref 150–400)
RBC: 4.46 MIL/uL (ref 4.22–5.81)
RDW: 16.7 % — AB (ref 11.5–15.5)
WBC: 6.1 10*3/uL (ref 4.0–10.5)
nRBC: 0 % (ref 0.0–0.2)

## 2018-09-17 ENCOUNTER — Inpatient Hospital Stay (HOSPITAL_COMMUNITY): Payer: Medicare Other

## 2018-09-17 VITALS — BP 128/57 | HR 60 | Temp 97.9°F | Resp 18 | Wt 187.0 lb

## 2018-09-17 DIAGNOSIS — D801 Nonfamilial hypogammaglobulinemia: Secondary | ICD-10-CM | POA: Diagnosis not present

## 2018-09-17 DIAGNOSIS — E538 Deficiency of other specified B group vitamins: Secondary | ICD-10-CM | POA: Diagnosis not present

## 2018-09-17 MED ORDER — ACETAMINOPHEN 325 MG PO TABS
650.0000 mg | ORAL_TABLET | Freq: Once | ORAL | Status: AC
  Administered 2018-09-17: 650 mg via ORAL

## 2018-09-17 MED ORDER — SODIUM CHLORIDE 0.9 % IV SOLN
Freq: Once | INTRAVENOUS | Status: AC
  Administered 2018-09-17: 11:00:00 via INTRAVENOUS
  Filled 2018-09-17: qty 4

## 2018-09-17 MED ORDER — CYANOCOBALAMIN 1000 MCG/ML IJ SOLN
1000.0000 ug | Freq: Once | INTRAMUSCULAR | Status: AC
  Administered 2018-09-17: 1000 ug via INTRAMUSCULAR

## 2018-09-17 MED ORDER — DEXTROSE 5 % IV SOLN
INTRAVENOUS | Status: DC
  Administered 2018-09-17: 10:00:00 via INTRAVENOUS

## 2018-09-17 MED ORDER — HEPARIN SOD (PORK) LOCK FLUSH 100 UNIT/ML IV SOLN
500.0000 [IU] | Freq: Once | INTRAVENOUS | Status: AC | PRN
  Administered 2018-09-17: 500 [IU]

## 2018-09-17 MED ORDER — IMMUNE GLOBULIN (HUMAN) 10 GM/100ML IV SOLN
400.0000 mg/kg | Freq: Once | INTRAVENOUS | Status: AC
  Administered 2018-09-17: 35 g via INTRAVENOUS
  Filled 2018-09-17: qty 50

## 2018-09-17 MED ORDER — SODIUM CHLORIDE 0.9 % IJ SOLN
10.0000 mL | INTRAMUSCULAR | Status: DC | PRN
  Filled 2018-09-17: qty 10

## 2018-09-17 NOTE — Patient Instructions (Signed)
[facility] Discharge Instructions for Patients Receiving Chemotherapy   Beginning January 23rd 2017 lab work for the [facility] will be done in the  Main lab at [facility] on 1st floor. If you have a lab appointment with the [facility] please come in thru the  Main Entrance and check in at the main information desk   Today you received the following chemotherapy agents IVIG and B12  To help prevent nausea and vomiting after your treatment, we encourage you to take your nausea medication   If you develop nausea and vomiting, or diarrhea that is not controlled by your medication, call the clinic.  The clinic phone number is [phone]. Office hours are Monday-Friday 8:30am-5:00pm.  BELOW ARE SYMPTOMS THAT SHOULD BE REPORTED IMMEDIATELY:  *FEVER GREATER THAN 101.0 F  *CHILLS WITH OR WITHOUT FEVER  NAUSEA AND VOMITING THAT IS NOT CONTROLLED WITH YOUR NAUSEA MEDICATION  *UNUSUAL SHORTNESS OF BREATH  *UNUSUAL BRUISING OR BLEEDING  TENDERNESS IN MOUTH AND THROAT WITH OR WITHOUT PRESENCE OF ULCERS  *URINARY PROBLEMS  *BOWEL PROBLEMS  UNUSUAL RASH Items with * indicate a potential emergency and should be followed up as soon as possible. If you have an emergency after office hours please contact your primary care physician or go to the nearest emergency department.  Please call the clinic during office hours if you have any questions or concerns.   You may also contact the Patient Navigator at [phone] should you have any questions or need assistance in obtaining follow up care.      Resources For Cancer Patients and their Caregivers ? American Cancer Society: Can assist with transportation, wigs, general needs, runs Look Good Feel Better.        (519) 539-4215 ? Cancer Care: Provides financial assistance, online support groups, medication/co-pay assistance.  1-800-813-HOPE 650 828 8064) ? [place] Assists [place]  cancer patients and their families through emotional , educational and financial support.  9175802606 ? [place] DSS Where to apply for food stamps, Medicaid and utility assistance. (208)613-3443 ? [employer]: Transportation to medical appointments. (970) 159-1894 ? Social Security Administration: May apply for disability if have a Stage IV cancer. (418) 339-5824 (216) 073-0015 ? [employer], Disability and Transit Services: Assists with nutrition, care and transit needs. 803-422-5683

## 2018-09-17 NOTE — Progress Notes (Signed)
Jeremy Parrish tolereted IVIG and B12 injection without incident or complaint. VSS throughout treatment. Discharged self ambulatory in satisfactory condition.

## 2018-09-28 ENCOUNTER — Other Ambulatory Visit (HOSPITAL_COMMUNITY): Payer: Self-pay | Admitting: *Deleted

## 2018-09-28 DIAGNOSIS — C9001 Multiple myeloma in remission: Secondary | ICD-10-CM

## 2018-09-28 MED ORDER — LENALIDOMIDE 10 MG PO CAPS: ORAL_CAPSULE | ORAL | 0 refills | Status: DC

## 2018-09-28 NOTE — Telephone Encounter (Signed)
Chart reviewed, revlimid refilled. 

## 2018-10-05 DIAGNOSIS — B356 Tinea cruris: Secondary | ICD-10-CM | POA: Diagnosis not present

## 2018-10-05 DIAGNOSIS — J441 Chronic obstructive pulmonary disease with (acute) exacerbation: Secondary | ICD-10-CM | POA: Diagnosis not present

## 2018-10-06 ENCOUNTER — Ambulatory Visit (INDEPENDENT_AMBULATORY_CARE_PROVIDER_SITE_OTHER): Payer: Medicare Other

## 2018-10-06 DIAGNOSIS — R001 Bradycardia, unspecified: Secondary | ICD-10-CM

## 2018-10-06 NOTE — Progress Notes (Signed)
Remote pacemaker transmission.   

## 2018-10-09 ENCOUNTER — Other Ambulatory Visit: Payer: Self-pay | Admitting: Cardiovascular Disease

## 2018-10-12 ENCOUNTER — Other Ambulatory Visit (HOSPITAL_COMMUNITY): Payer: Self-pay | Admitting: Adult Health Nurse Practitioner

## 2018-10-12 ENCOUNTER — Ambulatory Visit (HOSPITAL_COMMUNITY)
Admission: RE | Admit: 2018-10-12 | Discharge: 2018-10-12 | Disposition: A | Discharge status: Home / Self Care | Payer: Medicare Other | Source: Ambulatory Visit | Attending: Adult Health Nurse Practitioner | Admitting: Adult Health Nurse Practitioner

## 2018-10-12 DIAGNOSIS — R05 Cough: Secondary | ICD-10-CM | POA: Insufficient documentation

## 2018-10-12 DIAGNOSIS — R059 Cough, unspecified: Secondary | ICD-10-CM

## 2018-10-12 DIAGNOSIS — R0602 Shortness of breath: Secondary | ICD-10-CM | POA: Insufficient documentation

## 2018-10-16 ENCOUNTER — Inpatient Hospital Stay (HOSPITAL_COMMUNITY): Payer: Medicare Other | Attending: Hematology

## 2018-10-16 DIAGNOSIS — E538 Deficiency of other specified B group vitamins: Secondary | ICD-10-CM | POA: Diagnosis not present

## 2018-10-16 DIAGNOSIS — C9001 Multiple myeloma in remission: Secondary | ICD-10-CM | POA: Insufficient documentation

## 2018-10-16 DIAGNOSIS — I69320 Aphasia following cerebral infarction: Secondary | ICD-10-CM | POA: Insufficient documentation

## 2018-10-16 DIAGNOSIS — D801 Nonfamilial hypogammaglobulinemia: Secondary | ICD-10-CM | POA: Diagnosis not present

## 2018-10-16 DIAGNOSIS — Z8572 Personal history of non-Hodgkin lymphomas: Secondary | ICD-10-CM | POA: Insufficient documentation

## 2018-10-16 LAB — CBC WITH DIFFERENTIAL/PLATELET
Abs Immature Granulocytes: 0.02 10*3/uL (ref 0.00–0.07)
BASOS PCT: 1 %
Basophils Absolute: 0.1 10*3/uL (ref 0.0–0.1)
Eosinophils Absolute: 0.2 10*3/uL (ref 0.0–0.5)
Eosinophils Relative: 3 %
HCT: 36.7 % — ABNORMAL LOW (ref 39.0–52.0)
Hemoglobin: 11.3 g/dL — ABNORMAL LOW (ref 13.0–17.0)
Immature Granulocytes: 0 %
Lymphocytes Relative: 46 %
Lymphs Abs: 2.4 10*3/uL (ref 0.7–4.0)
MCH: 27.8 pg (ref 26.0–34.0)
MCHC: 30.8 g/dL (ref 30.0–36.0)
MCV: 90.2 fL (ref 80.0–100.0)
Monocytes Absolute: 0.6 10*3/uL (ref 0.1–1.0)
Monocytes Relative: 11 %
NEUTROS PCT: 39 %
Neutro Abs: 2.1 10*3/uL (ref 1.7–7.7)
PLATELETS: 121 10*3/uL — AB (ref 150–400)
RBC: 4.07 MIL/uL — ABNORMAL LOW (ref 4.22–5.81)
RDW: 17.2 % — ABNORMAL HIGH (ref 11.5–15.5)
WBC: 5.2 10*3/uL (ref 4.0–10.5)
nRBC: 0 % (ref 0.0–0.2)

## 2018-10-16 LAB — COMPREHENSIVE METABOLIC PANEL
ALT: 15 U/L (ref 0–44)
AST: 18 U/L (ref 15–41)
Albumin: 3.5 g/dL (ref 3.5–5.0)
Alkaline Phosphatase: 60 U/L (ref 38–126)
Anion gap: 4 — ABNORMAL LOW (ref 5–15)
BUN: 11 mg/dL (ref 8–23)
CHLORIDE: 109 mmol/L (ref 98–111)
CO2: 26 mmol/L (ref 22–32)
Calcium: 8.4 mg/dL — ABNORMAL LOW (ref 8.9–10.3)
Creatinine, Ser: 1.08 mg/dL (ref 0.61–1.24)
GFR calc Af Amer: >60 mL/min (ref >60)
GFR calc non Af Amer: >60 mL/min (ref >60)
Glucose, Bld: 90 mg/dL (ref 70–99)
Potassium: 3.9 mmol/L (ref 3.5–5.1)
Sodium: 139 mmol/L (ref 135–145)
Total Bilirubin: 0.4 mg/dL (ref 0.3–1.2)
Total Protein: 6.8 g/dL (ref 6.5–8.1)

## 2018-10-16 LAB — LACTATE DEHYDROGENASE: LDH: 123 U/L (ref 98–192)

## 2018-10-17 LAB — IGG, IGA, IGM
IgA: 430 mg/dL (ref 61–437)
IgG (Immunoglobin G), Serum: 1045 mg/dL (ref 700–1600)
IgM (Immunoglobulin M), Srm: 33 mg/dL (ref 15–143)

## 2018-10-19 ENCOUNTER — Encounter (HOSPITAL_COMMUNITY): Payer: Self-pay | Admitting: Hematology

## 2018-10-19 ENCOUNTER — Inpatient Hospital Stay (HOSPITAL_COMMUNITY): Payer: Medicare Other

## 2018-10-19 ENCOUNTER — Inpatient Hospital Stay (HOSPITAL_BASED_OUTPATIENT_CLINIC_OR_DEPARTMENT_OTHER): Payer: Medicare Other | Admitting: Hematology

## 2018-10-19 ENCOUNTER — Encounter (HOSPITAL_COMMUNITY): Payer: Self-pay

## 2018-10-19 VITALS — BP 134/71 | HR 67 | Temp 97.9°F | Resp 18 | Wt 186.2 lb

## 2018-10-19 VITALS — BP 131/71 | HR 65 | Temp 97.8°F | Resp 18

## 2018-10-19 DIAGNOSIS — C9001 Multiple myeloma in remission: Secondary | ICD-10-CM

## 2018-10-19 DIAGNOSIS — I69320 Aphasia following cerebral infarction: Secondary | ICD-10-CM | POA: Diagnosis not present

## 2018-10-19 DIAGNOSIS — D801 Nonfamilial hypogammaglobulinemia: Secondary | ICD-10-CM

## 2018-10-19 DIAGNOSIS — Z8572 Personal history of non-Hodgkin lymphomas: Secondary | ICD-10-CM

## 2018-10-19 DIAGNOSIS — C9000 Multiple myeloma not having achieved remission: Secondary | ICD-10-CM

## 2018-10-19 DIAGNOSIS — E538 Deficiency of other specified B group vitamins: Secondary | ICD-10-CM

## 2018-10-19 LAB — KAPPA/LAMBDA LIGHT CHAINS
Kappa free light chain: 52.3 mg/L — ABNORMAL HIGH (ref 3.3–19.4)
Kappa, lambda light chain ratio: 1.37 (ref 0.26–1.65)
Lambda free light chains: 38.1 mg/L — ABNORMAL HIGH (ref 5.7–26.3)

## 2018-10-19 LAB — PROTEIN ELECTROPHORESIS, SERUM
A/G Ratio: 1.1 (ref 0.7–1.7)
Albumin ELP: 3.3 g/dL (ref 2.9–4.4)
Alpha-1-Globulin: 0.3 g/dL (ref 0.0–0.4)
Alpha-2-Globulin: 0.7 g/dL (ref 0.4–1.0)
Beta Globulin: 1 g/dL (ref 0.7–1.3)
Gamma Globulin: 1 g/dL (ref 0.4–1.8)
Globulin, Total: 3 g/dL (ref 2.2–3.9)
Total Protein ELP: 6.3 g/dL (ref 6.0–8.5)

## 2018-10-19 MED ORDER — DEXTROSE 5 % IV SOLN
INTRAVENOUS | Status: DC
  Administered 2018-10-19: 10:00:00 via INTRAVENOUS

## 2018-10-19 MED ORDER — ACETAMINOPHEN 325 MG PO TABS
650.0000 mg | ORAL_TABLET | Freq: Once | ORAL | Status: AC
  Administered 2018-10-19: 650 mg via ORAL
  Filled 2018-10-19: qty 2

## 2018-10-19 MED ORDER — HEPARIN SOD (PORK) LOCK FLUSH 100 UNIT/ML IV SOLN
500.0000 [IU] | Freq: Once | INTRAVENOUS | Status: AC | PRN
  Administered 2018-10-19: 500 [IU]

## 2018-10-19 MED ORDER — HYDROMORPHONE HCL 1 MG/ML IJ SOLN
INTRAMUSCULAR | Status: AC
  Filled 2018-10-19: qty 1

## 2018-10-19 MED ORDER — SODIUM CHLORIDE 0.9 % IV SOLN
Freq: Once | INTRAVENOUS | Status: AC
  Administered 2018-10-19: 11:00:00 via INTRAVENOUS
  Filled 2018-10-19: qty 4

## 2018-10-19 MED ORDER — SODIUM CHLORIDE 0.9 % IJ SOLN
10.0000 mL | INTRAMUSCULAR | Status: DC | PRN
  Filled 2018-10-19: qty 10

## 2018-10-19 MED ORDER — CYANOCOBALAMIN 1000 MCG/ML IJ SOLN
1000.0000 ug | Freq: Once | INTRAMUSCULAR | Status: AC
  Administered 2018-10-19: 1000 ug via INTRAMUSCULAR
  Filled 2018-10-19: qty 1

## 2018-10-19 MED ORDER — SODIUM CHLORIDE 0.9% FLUSH
10.0000 mL | INTRAVENOUS | Status: DC | PRN
  Administered 2018-10-19: 10 mL via INTRAVENOUS
  Filled 2018-10-19: qty 10

## 2018-10-19 MED ORDER — IMMUNE GLOBULIN (HUMAN) 10 GM/100ML IV SOLN
400.0000 mg/kg | Freq: Once | INTRAVENOUS | Status: AC
  Administered 2018-10-19: 35 g via INTRAVENOUS
  Filled 2018-10-19: qty 200

## 2018-10-19 MED ORDER — ONDANSETRON HCL 4 MG/2ML IJ SOLN
INTRAMUSCULAR | Status: AC
  Filled 2018-10-19: qty 4

## 2018-10-19 NOTE — Progress Notes (Signed)
1040 Labs reviewed with and pt seen by Dr. Delton Coombes and pt approved for IVIG infusion today per MD                                                        Jeremy Parrish tolerated IVIG infusion and Vit B12 injection well without complaints or incident. VSS upon discharge. Pt discharged self ambulatory in satisfactory condition

## 2018-10-19 NOTE — Patient Instructions (Signed)
[place] at [facility] Discharge Instructions  Follow up with [place] in 6 weeks with labs. Stay on your normal treatment cycle.   Thank you for choosing [place] at [facility] to provide your oncology and hematology care.  To afford each patient quality time with our provider, please arrive at least 15 minutes before your scheduled appointment time.   If you have a lab appointment with the [facility] please come in thru the  Main Entrance and check in at the main information desk  You need to re-schedule your appointment should you arrive 10 or more minutes late.  We strive to give you quality time with our providers, and arriving late affects you and other patients whose appointments are after yours.  Also, if you no show three or more times for appointments you may be dismissed from the clinic at the providers discretion.     Again, thank you for choosing [facility].  Our hope is that these requests will decrease the amount of time that you wait before being seen by our physicians.       _____________________________________________________________  Should you have questions after your visit to [facility], please contact our office at [phone] between the hours of 8:00 a.m. and 4:30 p.m.  Voicemails left after 4:00 p.m. will not be returned until the following business day.  For prescription refill requests, have your pharmacy contact our office and allow 72 hours.    Cancer Center Support Programs:   > Cancer Support Group  2nd Tuesday of the month 1pm-2pm, Journey Room

## 2018-10-19 NOTE — Progress Notes (Signed)
[place], [place] [address]   CLINIC:  Medical Oncology/Hematology  PCP:  Celene Squibb, MD [place] [address] [phone]   REASON FOR VISIT: Follow-up for multiple myeloma and hypogammaglobulinemia  CURRENT THERAPY: Revlimid 7 days on 7 days off   INTERVAL HISTORY:  Mr. Oldaker 77 y.o. male returns for routine follow-up for multiple myeloma and hypogammaglobulinemia. He is here today for treatment and doing well. He is not having any problems taking the revlimid at home. He has mild diarrhea and takes about 4 imodium daily. He has numbness in his right hand. He needs another prescription. He denies any fevers or recent infections. He denies any nausea or vomiting. He reports his appetite is 100%. His energy level is 25%. His is having no issues maintaining his weight.     REVIEW OF SYSTEMS:  Review of Systems  All other systems reviewed and are negative.    PAST MEDICAL/SURGICAL HISTORY:  Past Medical History:  Diagnosis Date  . Anemia   . Aortic aneurysm of unspecified site without mention of rupture   . Arthritis   . Bladder neck contracture   . Cancer ([place])   . Cerebral atherosclerosis    Carotid Doppler, 02/16/2013 - Bilateral Proximal ICAs,demonstrate mild plaque w/o evidence of significant diameter reduction, dissection, or any other vascular abnormality  . CHF (congestive heart failure) ([place])   . Complication of anesthesia   . COPD (chronic obstructive pulmonary disease) ([place])   . Coronary artery disease   . Depression   . Esophageal reflux   . Heart disease   . Heart murmur   . Hx of bladder cancer 10/07/2012  . Hyperlipidemia   . Hypertension   . 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 Intestinovesical fistula   . Kidney stones    history  . Lung mass   . Multiple myeloma   . Myocardial infarction [facility])    '96  . Non  Hodgkin's lymphoma ([place])   . Paroxysmal atrial fibrillation ([place]) 01/02/2016  . Peripheral arterial disease ([place])   . Personal history of other diseases of circulatory system   . PONV (postoperative nausea and vomiting)   . Prostate cancer ([place]) 2000  . Shingles   . Shortness of breath   . Sleep apnea    05-02-14 cpap , not yet used- suggested settings 5  . Stroke [facility]) 2013   Speech.   Past Surgical History:  Procedure Laterality Date  . BLADDER SURGERY    . BONE MARROW TRANSPLANT  2011  . COLON SURGERY     colon resection  . COLONOSCOPY N/A 01/01/2013   Procedure: COLONOSCOPY;  Surgeon: Rogene Houston, MD;  Location: [facility];  Service: Endoscopy;  Laterality: N/A;  825-moved to [place] notified pt  . COLONOSCOPY N/A 07/16/2018   Procedure: COLONOSCOPY;  Surgeon: Rogene Houston, MD;  Location: [facility] SUITE;  Service: Endoscopy;  Laterality: N/A;  1:25  . CORONARY ANGIOPLASTY  06/24/2000   PCI and stenting in mid & proximal RCA  . heart stents x 5  1999  . INGUINAL HERNIA REPAIR Right 05/04/2014   Procedure: OPEN RIGHT INGUINAL HERNIA REPAIR with mesh;  Surgeon: Edward Jolly, MD;  Location: [facility] ORS;  Service: General;  Laterality: Right;  . INSERT / REPLACE / REMOVE PACEMAKER    . left ear skin cancer removed    .  NM MYOCAR PERF WALL MOTION  11/27/2007   inferior scar  . PACEMAKER INSERTION  07/22/2011   [employer]  . POLYPECTOMY  07/16/2018   Procedure: POLYPECTOMY;  Surgeon: Rogene Houston, MD;  Location: [facility];  Service: Endoscopy;;  colon  . PORTACATH PLACEMENT  07/26/2009   right chest  . PROSTATE SURGERY    . Rotator    . ROTATOR CUFF REPAIR Right   . SHOULDER ARTHROSCOPY WITH SUBACROMIAL DECOMPRESSION Right 07/21/2013   Procedure: RIGHT SHOULDER ARTHROSCOPY WITH SUBACROMIAL DECOMPRESSION AND DEBRIDEMENT & Injection of Left Shoulder;  Surgeon: Alta Corning, MD;  Location: [place];  Service: Orthopedics;  Laterality: Right;  . TEE WITHOUT CARDIOVERSION   10/13/2012   Procedure: TRANSESOPHAGEAL ECHOCARDIOGRAM (TEE);  Surgeon: Sanda Klein, MD;  Location: [facility] ENDOSCOPY;  Service: Cardiovascular;  Laterality: N/A;  pat/kay/echo notified  . US ECHOCARDIOGRAPHY  06/19/2011   RV mildly dilated,mild to mod. MR,mild AI,mild PI  . WRIST SURGERY     right     SOCIAL HISTORY:  Social History   Socioeconomic History  . Marital status: Married    Spouse name: [name]  . Number of children: 3  . Years of education: 9th  . Highest education level: Not on file  Occupational History  . Occupation: retired   Scientific laboratory technician  . Financial resource strain: Not on file  . Food insecurity:    Worry: Not on file    Inability: Not on file  . Transportation needs:    Medical: Not on file    Non-medical: Not on file  Tobacco Use  . Smoking status: Former Smoker    Packs/day: 1.00    Years: 20.00    Pack years: 20.00    Types: Cigarettes    Last attempt to quit: 11/14/1994    Years since quitting: 23.9  . Smokeless tobacco: Never Used  Substance and Sexual Activity  . Alcohol use: No    Alcohol/week: 0.0 standard drinks    Comment: previously drank but none for at least 15 years.  . Drug use: No  . Sexual activity: Not on file  Lifestyle  . Physical activity:    Days per week: Not on file    Minutes per session: Not on file  . Stress: Not on file  Relationships  . Social connections:    Talks on phone: Not on file    Gets together: Not on file    Attends religious service: Not on file    Active member of club or organization: Not on file    Attends meetings of clubs or organizations: Not on file    Relationship status: Not on file  . Intimate partner violence:    Fear of current or ex partner: Not on file    Emotionally abused: Not on file    Physically abused: Not on file    Forced sexual activity: Not on file  Other Topics Concern  . Not on file  Social History Narrative   Patient lives at home spouse.   Caffeine Use: Occasionally     FAMILY HISTORY:  Family History  Problem Relation Age of Onset  . Cancer Father        bladder  . Heart disease Father        before age 36  . Hypertension Mother   . Cancer Brother   . Heart disease Brother        before age 59  . Heart disease Sister  before age 69  . Hyperlipidemia Sister   . Hypertension Sister   . Heart attack Sister   . Colon cancer Neg Hx   . Colon polyps Neg Hx     CURRENT MEDICATIONS:  Outpatient Encounter Medications as of 10/19/2018  Medication Sig  . albuterol (PROVENTIL HFA;VENTOLIN HFA) 108 (90 Base) MCG/ACT inhaler Inhale 2 puffs into the lungs every 6 (six) hours as needed for wheezing or shortness of breath.  Marland Kitchen albuterol (PROVENTIL) (2.5 MG/3ML) 0.083% nebulizer solution Use via neb q 4 hours prn wheezing  . amLODipine (NORVASC) 10 MG tablet TAKE 1 TABLET BY MOUTH EVERY DAY IN THE MORNING  . apixaban (ELIQUIS) 5 MG TABS tablet Take by mouth.  . citalopram (CELEXA) 40 MG tablet TAKE 1 TABLET (40 MG TOTAL) BY MOUTH DAILY.  Marland Kitchen clopidogrel (PLAVIX) 75 MG tablet Take 1 tablet (75 mg total) by mouth daily.  . clopidogrel (PLAVIX) 75 MG tablet TAKE 1 TABLET BY MOUTH EVERY DAY  . ELIQUIS 5 MG TABS tablet TAKE 1 TABLET BY MOUTH TWICE A DAY  . furosemide (LASIX) 20 MG tablet Use sparingly as needed for edema. No more than one tablet by mouth per week. (Patient taking differently: Take 20 mg by mouth as needed. )  . HYDROcodone-acetaminophen (NORCO/VICODIN) 5-325 MG tablet Take 1 tablet by mouth 2 (two) times daily as needed. for pain  . HYDROcodone-homatropine (HYCODAN) 5-1.5 MG/5ML syrup TAKE 5 MLS BY MOUTH EVERY 6 HOURS AS NEEDED SEVERE COUGH  . KLOR-CON M20 20 MEQ tablet TAKE 1 TABLET BY MOUTH 3 TIMES A DAY  . lenalidomide (REVLIMID) 10 MG capsule TAKE 1 CAPSULE BY MOUTH DAILY FOR 7 DAYS ON FOLLOWED BY 7 DAYS OFF. THEN REPEAT CYCLE.  Marland Kitchen levocetirizine (XYZAL) 5 MG tablet Take 5 mg by mouth every evening.   . lidocaine (XYLOCAINE) 2 % solution    . mometasone (NASONEX) 50 MCG/ACT nasal spray SPRAY 1 SPRAY INTO EACH NOSTRIL TWICE A DAY  . nitroGLYCERIN (NITROSTAT) 0.4 MG SL tablet Place 1 tablet (0.4 mg total) under the tongue every 5 (five) minutes as needed for chest pain.  . nortriptyline (PAMELOR) 25 MG capsule   . omeprazole (PRILOSEC) 20 MG capsule Take 20 mg by mouth daily.  . ondansetron (ZOFRAN) 4 MG tablet TAKE 1 TABLET BY MOUTH EVERY 8 HOURS AS NEEDED FOR SEVERE NAUSEA  . predniSONE (DELTASONE) 10 MG tablet PLEASE SEE ATTACHED FOR DETAILED DIRECTIONS  . simvastatin (ZOCOR) 20 MG tablet TAKE 1 TABLET BY MOUTH EVERYDAY AT BEDTIME   Facility-Administered Encounter Medications as of 10/19/2018  Medication  . acetaminophen (TYLENOL) tablet 650 mg  . sodium chloride 0.9 % injection 10 mL    ALLERGIES:  Allergies  Allergen Reactions  . Morphine And Related Other (See Comments)    hallucinations  . Tape Rash    Paper tape is ok     PHYSICAL EXAM:  ECOG Performance status: 1  Vitals:   10/19/18 1005  BP: 134/71  Pulse: 67  Resp: 18  Temp: 97.9 F (36.6 C)  SpO2: 96%   Filed Weights   10/19/18 1005  Weight: 186 lb 3.2 oz (84.5 kg)    Physical Exam  Constitutional: He is oriented to person, place, and time. He appears well-developed and well-nourished.  Cardiovascular: Normal rate, regular rhythm and normal heart sounds.  Pulmonary/Chest: Effort normal and breath sounds normal.  Musculoskeletal: Normal range of motion.  Neurological: He is alert and oriented to person, place, and time.  Skin: Skin  is warm and dry.  Psychiatric: He has a normal mood and affect. His behavior is normal. Judgment and thought content normal.     LABORATORY DATA:  I have reviewed the labs as listed.  CBC    Component Value Date/Time   WBC 5.2 10/16/2018 1048   RBC 4.07 (L) 10/16/2018 1048   HGB 11.3 (L) 10/16/2018 1048   HCT 36.7 (L) 10/16/2018 1048   PLT 121 (L) 10/16/2018 1048   MCV 90.2 10/16/2018 1048   MCH 27.8  10/16/2018 1048   MCHC 30.8 10/16/2018 1048   RDW 17.2 (H) 10/16/2018 1048   LYMPHSABS 2.4 10/16/2018 1048   MONOABS 0.6 10/16/2018 1048   EOSABS 0.2 10/16/2018 1048   BASOSABS 0.1 10/16/2018 1048   CMP Latest Ref Rng & Units 10/16/2018 09/15/2018 08/12/2018  Glucose 70 - 99 mg/dL 90 134(H) 75  BUN 8 - 23 mg/dL '11 18 10  '$ Creatinine 0.61 - 1.24 mg/dL 1.08 0.96 1.18  Sodium 135 - 145 mmol/L 139 136 139  Potassium 3.5 - 5.1 mmol/L 3.9 4.1 4.2  Chloride 98 - 111 mmol/L 109 104 107  CO2 22 - 32 mmol/L '26 25 24  '$ Calcium 8.9 - 10.3 mg/dL 8.4(L) 9.0 9.2  Total Protein 6.5 - 8.1 g/dL 6.8 6.7 7.2  Total Bilirubin 0.3 - 1.2 mg/dL 0.4 0.6 0.5  Alkaline Phos 38 - 126 U/L 60 67 75  AST 15 - 41 U/L '18 18 19  '$ ALT 0 - 44 U/L '15 24 14      '$ I have reviewed Francene Finders, NP's note and agree with the documentation.  I personally performed a face-to-face visit, made revisions and my assessment and plan is as follows.       ASSESSMENT & PLAN:   Multiple myeloma in remission 1.  IgG lambda multiple myeloma:  -Status post auto stem cell transplant at [facility] on 08/24/2009 -On Revlimid maintenance 10 mg 7 days on/7 days off since 2011, well-tolerated except some diarrhea.  He is requiring anywhere between 2 to 6 Imodium every day.  On an average he takes 4 tablets. - We talked about the myeloma panel from 08/12/2018.  This was negative.  The panel was sent 3 days ago which is pending. - He will continue Revlimid maintenance, as he is continuing to be in remission. -I will see him back in 12 weeks for follow-up.  We will repeat myeloma panel prior to next visit.  2.  Hypogammaglobulinemia: -He is receiving IVIG monthly at 400 mg/kg.  Last infection was on 03/11/2018 when he had community-acquired pneumonia. - His most recent IgG trough level on 10/16/2018 was 1045.  As he is not having any recurrent infections, I have recommended to spread out his IVIG to every 6 weeks. -I might consider  spreading it out every 8 weeks in the future.  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  His physical examination did not reveal any adenopathy or splenomegaly.  LDH was normal.  4.  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   5.  Vitamin B12 deficiency: - He is receiving B12 injections monthly.      Orders placed this encounter:  Orders Placed This Encounter  Procedures  . IgG, IgA, IgM  . Lactate dehydrogenase  . Protein electrophoresis, serum  . Immunofixation electrophoresis  . Kappa/lambda light chains  . CBC with Differential/Platelet  . Comprehensive metabolic panel      Derek Jack,  MD [place] [phone]

## 2018-10-19 NOTE — Patient Instructions (Signed)
[facility] at [facility] Discharge Instructions  Received IVIG infusion today. Follow-up as scheduled. Call clinic for any questions or concerns   Thank you for choosing [facility] at [facility] to provide your oncology and hematology care.  To afford each patient quality time with our provider, please arrive at least 15 minutes before your scheduled appointment time.   If you have a lab appointment with the Cancer Center please come in thru the  Main Entrance and check in at the main information desk  You need to re-schedule your appointment should you arrive 10 or more minutes late.  We strive to give you quality time with our providers, and arriving late affects you and other patients whose appointments are after yours.  Also, if you no show three or more times for appointments you may be dismissed from the clinic at the providers discretion.     Again, thank you for choosing  Cancer Center.  Our hope is that these requests will decrease the amount of time that you wait before being seen by our physicians.       _____________________________________________________________  Should you have questions after your visit to  Cancer Center, please contact our office at [phone] between the hours of 8:00 a.m. and 4:30 p.m.  Voicemails left after 4:00 p.m. will not be returned until the following business day.  For prescription refill requests, have your pharmacy contact our office and allow 72 hours.    Cancer Center Support Programs:   > Cancer Support Group  2nd Tuesday of the month 1pm-2pm, Journey Room   

## 2018-10-19 NOTE — Assessment & Plan Note (Signed)
1.  IgG lambda multiple myeloma:  -Status post auto stem cell transplant at [facility] on 08/24/2009 -On Revlimid maintenance 10 mg 7 days on/7 days off since 2011, well-tolerated except some diarrhea.  He is requiring anywhere between 2 to 6 Imodium every day.  On an average he takes 4 tablets. - We talked about the myeloma panel from 08/12/2018.  This was negative.  The panel was sent 3 days ago which is pending. - He will continue Revlimid maintenance, as he is continuing to be in remission. -I will see him back in 12 weeks for follow-up.  We will repeat myeloma panel prior to next visit.  2.  Hypogammaglobulinemia: -He is receiving IVIG monthly at 400 mg/kg.  Last infection was on 03/11/2018 when he had community-acquired pneumonia. - His most recent IgG trough level on 10/16/2018 was 1045.  As he is not having any recurrent infections, I have recommended to spread out his IVIG to every 6 weeks. -I might consider spreading it out every 8 weeks in the future.  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  His physical examination did not reveal any adenopathy or splenomegaly.  LDH was normal.  4.  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   5.  Vitamin B12 deficiency: - He is receiving B12 injections monthly.

## 2018-10-20 LAB — IMMUNOFIXATION ELECTROPHORESIS
IGG (IMMUNOGLOBIN G), SERUM: 1081 mg/dL (ref 700–1600)
IgA: 427 mg/dL (ref 61–437)
IgM (Immunoglobulin M), Srm: 39 mg/dL (ref 15–143)
Total Protein ELP: 6.4 g/dL (ref 6.0–8.5)

## 2018-10-23 ENCOUNTER — Other Ambulatory Visit (HOSPITAL_COMMUNITY): Payer: Self-pay | Admitting: *Deleted

## 2018-10-23 DIAGNOSIS — C9001 Multiple myeloma in remission: Secondary | ICD-10-CM

## 2018-10-23 MED ORDER — LENALIDOMIDE 10 MG PO CAPS: ORAL_CAPSULE | ORAL | 0 refills | Status: DC

## 2018-10-23 NOTE — Telephone Encounter (Signed)
Chart reviewed, revlimid refilled. 

## 2018-11-09 ENCOUNTER — Other Ambulatory Visit (HOSPITAL_COMMUNITY): Payer: Self-pay | Admitting: Internal Medicine

## 2018-11-09 DIAGNOSIS — J441 Chronic obstructive pulmonary disease with (acute) exacerbation: Secondary | ICD-10-CM | POA: Diagnosis not present

## 2018-11-10 ENCOUNTER — Other Ambulatory Visit (HOSPITAL_COMMUNITY): Payer: Self-pay | Admitting: Internal Medicine

## 2018-11-10 DIAGNOSIS — J449 Chronic obstructive pulmonary disease, unspecified: Secondary | ICD-10-CM

## 2018-11-10 DIAGNOSIS — R0602 Shortness of breath: Secondary | ICD-10-CM

## 2018-11-13 ENCOUNTER — Ambulatory Visit (HOSPITAL_COMMUNITY)
Admission: RE | Admit: 2018-11-13 | Discharge: 2018-11-13 | Disposition: A | Discharge status: Home / Self Care | Payer: Medicare Other | Source: Ambulatory Visit | Attending: Internal Medicine | Admitting: Internal Medicine

## 2018-11-13 DIAGNOSIS — R0602 Shortness of breath: Secondary | ICD-10-CM | POA: Insufficient documentation

## 2018-11-13 DIAGNOSIS — J441 Chronic obstructive pulmonary disease with (acute) exacerbation: Secondary | ICD-10-CM | POA: Diagnosis not present

## 2018-11-13 NOTE — Progress Notes (Signed)
*  PRELIMINARY RESULTS* Echocardiogram 2D Echocardiogram has been performed.  Jeremy Parrish 11/13/2018, 10:55 AM

## 2018-11-16 ENCOUNTER — Inpatient Hospital Stay (HOSPITAL_COMMUNITY): Payer: Medicare Other | Attending: Hematology

## 2018-11-16 ENCOUNTER — Encounter (HOSPITAL_COMMUNITY): Payer: Self-pay

## 2018-11-16 VITALS — BP 135/77 | HR 79 | Temp 97.5°F | Resp 18

## 2018-11-16 DIAGNOSIS — C9001 Multiple myeloma in remission: Secondary | ICD-10-CM | POA: Diagnosis not present

## 2018-11-16 DIAGNOSIS — E538 Deficiency of other specified B group vitamins: Secondary | ICD-10-CM | POA: Diagnosis not present

## 2018-11-16 DIAGNOSIS — D801 Nonfamilial hypogammaglobulinemia: Secondary | ICD-10-CM

## 2018-11-16 MED ORDER — CYANOCOBALAMIN 1000 MCG/ML IJ SOLN
1000.0000 ug | Freq: Once | INTRAMUSCULAR | Status: AC
  Administered 2018-11-16: 1000 ug via INTRAMUSCULAR

## 2018-11-16 NOTE — Progress Notes (Signed)
Jeremy Parrish tolerated Vit B12 injection well without complaints or incident. VSS Pt discharged self ambulatory in satisfactory condition

## 2018-11-16 NOTE — Patient Instructions (Signed)
[facility] at [facility] Discharge Instructions  Received Vit B12 injection today. Follow-up as scheduled. Call clinic for any questions or concerns   Thank you for choosing [facility] at [facility] to provide your oncology and hematology care.  To afford each patient quality time with our provider, please arrive at least 15 minutes before your scheduled appointment time.   If you have a lab appointment with the Cancer Center please come in thru the  Main Entrance and check in at the main information desk  You need to re-schedule your appointment should you arrive 10 or more minutes late.  We strive to give you quality time with our providers, and arriving late affects you and other patients whose appointments are after yours.  Also, if you no show three or more times for appointments you may be dismissed from the clinic at the providers discretion.     Again, thank you for choosing [facility].  Our hope is that these requests will decrease the amount of time that you wait before being seen by our physicians.       _____________________________________________________________  Should you have questions after your visit to [facility], please contact our office at [phone] between the hours of 8:00 a.m. and 4:30 p.m.  Voicemails left after 4:00 p.m. will not be returned until the following business day.  For prescription refill requests, have your pharmacy contact our office and allow 72 hours.    Cancer Center Support Programs:   > Cancer Support Group  2nd Tuesday of the month 1pm-2pm, Journey Room   

## 2018-11-18 DIAGNOSIS — I5032 Chronic diastolic (congestive) heart failure: Secondary | ICD-10-CM | POA: Diagnosis not present

## 2018-11-18 DIAGNOSIS — J441 Chronic obstructive pulmonary disease with (acute) exacerbation: Secondary | ICD-10-CM | POA: Diagnosis not present

## 2018-11-25 ENCOUNTER — Other Ambulatory Visit: Payer: Self-pay | Admitting: Cardiovascular Disease

## 2018-11-25 DIAGNOSIS — E785 Hyperlipidemia, unspecified: Secondary | ICD-10-CM

## 2018-11-25 NOTE — Telephone Encounter (Signed)
Rx request sent to pharmacy.  

## 2018-11-27 ENCOUNTER — Inpatient Hospital Stay (HOSPITAL_COMMUNITY): Payer: Medicare Other

## 2018-11-27 DIAGNOSIS — D801 Nonfamilial hypogammaglobulinemia: Secondary | ICD-10-CM | POA: Diagnosis not present

## 2018-11-27 DIAGNOSIS — E538 Deficiency of other specified B group vitamins: Secondary | ICD-10-CM | POA: Diagnosis not present

## 2018-11-27 DIAGNOSIS — C9000 Multiple myeloma not having achieved remission: Secondary | ICD-10-CM

## 2018-11-27 DIAGNOSIS — C9001 Multiple myeloma in remission: Secondary | ICD-10-CM | POA: Diagnosis not present

## 2018-11-27 LAB — CUP PACEART REMOTE DEVICE CHECK
Brady Statistic AP VP Percent: 1.87 %
Brady Statistic AP VS Percent: 77.61 %
Brady Statistic AS VP Percent: 0.07 %
Brady Statistic AS VS Percent: 20.44 %
Brady Statistic RA Percent Paced: 75.21 %
Date Time Interrogation Session: 20191126162713
Implantable Lead Implant Date: 20120910
Implantable Lead Location: 753859
Implantable Lead Location: 753860
Lead Channel Impedance Value: 408 Ohm
Lead Channel Impedance Value: 416 Ohm
Lead Channel Sensing Intrinsic Amplitude: 2.927 mV
Lead Channel Sensing Intrinsic Amplitude: 5.723 mV
Lead Channel Setting Pacing Amplitude: 2.5 V
Lead Channel Setting Pacing Pulse Width: 0.6 ms
Lead Channel Setting Sensing Sensitivity: 0.9 mV
MDC IDC LEAD IMPLANT DT: 20120910
MDC IDC MSMT BATTERY VOLTAGE: 2.93 V
MDC IDC PG IMPLANT DT: 20120910
MDC IDC SET LEADCHNL RA PACING AMPLITUDE: 2 V
MDC IDC STAT BRADY RV PERCENT PACED: 1.96 %

## 2018-11-27 LAB — CBC WITH DIFFERENTIAL/PLATELET
Abs Immature Granulocytes: 0.01 10*3/uL (ref 0.00–0.07)
Basophils Absolute: 0.1 10*3/uL (ref 0.0–0.1)
Basophils Relative: 1 %
Eosinophils Absolute: 0.2 10*3/uL (ref 0.0–0.5)
Eosinophils Relative: 3 %
HCT: 39.2 % (ref 39.0–52.0)
Hemoglobin: 12.1 g/dL — ABNORMAL LOW (ref 13.0–17.0)
Immature Granulocytes: 0 %
LYMPHS PCT: 62 %
Lymphs Abs: 2.9 10*3/uL (ref 0.7–4.0)
MCH: 27.9 pg (ref 26.0–34.0)
MCHC: 30.9 g/dL (ref 30.0–36.0)
MCV: 90.3 fL (ref 80.0–100.0)
Monocytes Absolute: 0.4 10*3/uL (ref 0.1–1.0)
Monocytes Relative: 8 %
Neutro Abs: 1.2 10*3/uL — ABNORMAL LOW (ref 1.7–7.7)
Neutrophils Relative %: 26 %
Platelets: 116 10*3/uL — ABNORMAL LOW (ref 150–400)
RBC: 4.34 MIL/uL (ref 4.22–5.81)
RDW: 16.2 % — ABNORMAL HIGH (ref 11.5–15.5)
WBC: 4.7 10*3/uL (ref 4.0–10.5)
nRBC: 0 % (ref 0.0–0.2)

## 2018-11-27 LAB — COMPREHENSIVE METABOLIC PANEL
ALBUMIN: 3.9 g/dL (ref 3.5–5.0)
ALT: 18 U/L (ref 0–44)
AST: 23 U/L (ref 15–41)
Alkaline Phosphatase: 60 U/L (ref 38–126)
Anion gap: 9 (ref 5–15)
BUN: 14 mg/dL (ref 8–23)
CHLORIDE: 105 mmol/L (ref 98–111)
CO2: 26 mmol/L (ref 22–32)
Calcium: 8.5 mg/dL — ABNORMAL LOW (ref 8.9–10.3)
Creatinine, Ser: 1.23 mg/dL (ref 0.61–1.24)
GFR calc Af Amer: >60 mL/min (ref >60)
GFR calc non Af Amer: 57 mL/min — ABNORMAL LOW (ref >60)
Glucose, Bld: 105 mg/dL — ABNORMAL HIGH (ref 70–99)
Potassium: 4.2 mmol/L (ref 3.5–5.1)
SODIUM: 140 mmol/L (ref 135–145)
Total Bilirubin: 0.7 mg/dL (ref 0.3–1.2)
Total Protein: 7.3 g/dL (ref 6.5–8.1)

## 2018-11-30 ENCOUNTER — Inpatient Hospital Stay (HOSPITAL_COMMUNITY): Payer: Medicare Other

## 2018-12-01 ENCOUNTER — Inpatient Hospital Stay (HOSPITAL_COMMUNITY): Payer: Medicare Other

## 2018-12-01 ENCOUNTER — Encounter (HOSPITAL_COMMUNITY): Payer: Self-pay

## 2018-12-01 VITALS — BP 131/67 | HR 62 | Temp 97.9°F | Resp 18 | Wt 186.2 lb

## 2018-12-01 DIAGNOSIS — C9001 Multiple myeloma in remission: Secondary | ICD-10-CM | POA: Diagnosis not present

## 2018-12-01 DIAGNOSIS — E538 Deficiency of other specified B group vitamins: Secondary | ICD-10-CM | POA: Diagnosis not present

## 2018-12-01 DIAGNOSIS — D801 Nonfamilial hypogammaglobulinemia: Secondary | ICD-10-CM

## 2018-12-01 MED ORDER — SODIUM CHLORIDE 0.9 % IJ SOLN
10.0000 mL | INTRAMUSCULAR | Status: DC | PRN
  Filled 2018-12-01: qty 10

## 2018-12-01 MED ORDER — IMMUNE GLOBULIN (HUMAN) 10 GM/100ML IV SOLN
400.0000 mg/kg | Freq: Once | INTRAVENOUS | Status: AC
  Administered 2018-12-01: 35 g via INTRAVENOUS
  Filled 2018-12-01: qty 200

## 2018-12-01 MED ORDER — DEXTROSE 5 % IV SOLN
INTRAVENOUS | Status: DC
  Administered 2018-12-01: 09:00:00 via INTRAVENOUS

## 2018-12-01 MED ORDER — ACETAMINOPHEN 325 MG PO TABS
650.0000 mg | ORAL_TABLET | Freq: Once | ORAL | Status: AC
  Administered 2018-12-01: 650 mg via ORAL
  Filled 2018-12-01: qty 2

## 2018-12-01 MED ORDER — SODIUM CHLORIDE 0.9 % IV SOLN
Freq: Once | INTRAVENOUS | Status: AC
  Administered 2018-12-01: 09:00:00 via INTRAVENOUS
  Filled 2018-12-01: qty 4

## 2018-12-01 MED ORDER — HEPARIN SOD (PORK) LOCK FLUSH 100 UNIT/ML IV SOLN
500.0000 [IU] | Freq: Once | INTRAVENOUS | Status: AC | PRN
  Administered 2018-12-01: 500 [IU]
  Filled 2018-12-01: qty 5

## 2018-12-01 MED ORDER — SODIUM CHLORIDE 0.9% FLUSH
10.0000 mL | INTRAVENOUS | Status: DC | PRN
  Administered 2018-12-01: 10 mL via INTRAVENOUS
  Filled 2018-12-01: qty 10

## 2018-12-01 NOTE — Progress Notes (Signed)
Jeremy Parrish tolerated IVIG infusion well without complaints or incident. VSS upon discharge. Pt discharged self ambulatory in satisfactory condition

## 2018-12-01 NOTE — Patient Instructions (Signed)
[facility] at [facility] Discharge Instructions  Received IVIG infusion today. Follow-up as scheduled. Call clinic for any questions or concerns   Thank you for choosing [facility] at [facility] to provide your oncology and hematology care.  To afford each patient quality time with our provider, please arrive at least 15 minutes before your scheduled appointment time.   If you have a lab appointment with the Cancer Center please come in thru the  Main Entrance and check in at the main information desk  You need to re-schedule your appointment should you arrive 10 or more minutes late.  We strive to give you quality time with our providers, and arriving late affects you and other patients whose appointments are after yours.  Also, if you no show three or more times for appointments you may be dismissed from the clinic at the providers discretion.     Again, thank you for choosing [facility].  Our hope is that these requests will decrease the amount of time that you wait before being seen by our physicians.       _____________________________________________________________  Should you have questions after your visit to [facility], please contact our office at [phone] between the hours of 8:00 a.m. and 4:30 p.m.  Voicemails left after 4:00 p.m. will not be returned until the following business day.  For prescription refill requests, have your pharmacy contact our office and allow 72 hours.    Cancer Center Support Programs:   > Cancer Support Group  2nd Tuesday of the month 1pm-2pm, Journey Room   

## 2018-12-02 ENCOUNTER — Other Ambulatory Visit (HOSPITAL_COMMUNITY): Payer: Self-pay | Admitting: *Deleted

## 2018-12-02 DIAGNOSIS — C9001 Multiple myeloma in remission: Secondary | ICD-10-CM

## 2018-12-02 MED ORDER — LENALIDOMIDE 10 MG PO CAPS: ORAL_CAPSULE | ORAL | 0 refills | Status: DC

## 2018-12-02 NOTE — Telephone Encounter (Signed)
Chart reviewed, revlimid refilled. 

## 2018-12-03 ENCOUNTER — Other Ambulatory Visit: Payer: Self-pay | Admitting: Family Medicine

## 2018-12-10 ENCOUNTER — Other Ambulatory Visit: Payer: Self-pay | Admitting: *Deleted

## 2018-12-14 ENCOUNTER — Inpatient Hospital Stay (HOSPITAL_COMMUNITY): Payer: Medicare Other | Attending: Hematology

## 2018-12-14 ENCOUNTER — Encounter (HOSPITAL_COMMUNITY): Payer: Self-pay

## 2018-12-14 VITALS — BP 135/67 | HR 72 | Temp 98.6°F | Resp 16

## 2018-12-14 DIAGNOSIS — D801 Nonfamilial hypogammaglobulinemia: Secondary | ICD-10-CM

## 2018-12-14 DIAGNOSIS — C9000 Multiple myeloma not having achieved remission: Secondary | ICD-10-CM | POA: Diagnosis not present

## 2018-12-14 DIAGNOSIS — C9001 Multiple myeloma in remission: Secondary | ICD-10-CM | POA: Insufficient documentation

## 2018-12-14 DIAGNOSIS — E538 Deficiency of other specified B group vitamins: Secondary | ICD-10-CM | POA: Diagnosis not present

## 2018-12-14 MED ORDER — CYANOCOBALAMIN 1000 MCG/ML IJ SOLN
1000.0000 ug | Freq: Once | INTRAMUSCULAR | Status: AC
  Administered 2018-12-14: 1000 ug via INTRAMUSCULAR
  Filled 2018-12-14: qty 1

## 2018-12-14 NOTE — Patient Instructions (Signed)
[facility] at [facility]  Discharge Instructions:   _______________________________________________________________  Thank you for choosing [facility] at [facility] to provide your oncology and hematology care.  To afford each patient quality time with our providers, please arrive at least 15 minutes before your scheduled appointment.  You need to re-schedule your appointment if you arrive 10 or more minutes late.  We strive to give you quality time with our providers, and arriving late affects you and other patients whose appointments are after yours.  Also, if you no show three or more times for appointments you may be dismissed from the clinic.  Again, thank you for choosing [facility] at [facility]. Our hope is that these requests will allow you access to exceptional care and in a timely manner. _______________________________________________________________  If you have questions after your visit, please contact our office at [phone] between the hours of 8:30 a.m. and 5:00 p.m. Voicemails left after 4:30 p.m. will not be returned until the following business day. _______________________________________________________________  For prescription refill requests, have your pharmacy contact our office. _______________________________________________________________  Recommendations made by the consultant and any test results will be sent to your referring physician. _______________________________________________________________ 

## 2018-12-14 NOTE — Progress Notes (Signed)
Jeremy Parrish presents today for injection per MD orders. B12 1,000 mcg administered SQ in right Upper Arm. Administration without incident. Patient tolerated well.  Vitals stable and discharged home from clinic ambulatory. Follow up as scheduled.

## 2018-12-18 DIAGNOSIS — I2510 Atherosclerotic heart disease of native coronary artery without angina pectoris: Secondary | ICD-10-CM | POA: Diagnosis not present

## 2018-12-18 DIAGNOSIS — C8590 Non-Hodgkin lymphoma, unspecified, unspecified site: Secondary | ICD-10-CM | POA: Diagnosis not present

## 2018-12-18 DIAGNOSIS — D696 Thrombocytopenia, unspecified: Secondary | ICD-10-CM | POA: Diagnosis not present

## 2018-12-18 DIAGNOSIS — I5032 Chronic diastolic (congestive) heart failure: Secondary | ICD-10-CM | POA: Diagnosis not present

## 2018-12-18 DIAGNOSIS — J449 Chronic obstructive pulmonary disease, unspecified: Secondary | ICD-10-CM | POA: Diagnosis not present

## 2018-12-18 DIAGNOSIS — D631 Anemia in chronic kidney disease: Secondary | ICD-10-CM | POA: Diagnosis not present

## 2018-12-18 DIAGNOSIS — I4891 Unspecified atrial fibrillation: Secondary | ICD-10-CM | POA: Diagnosis not present

## 2018-12-18 DIAGNOSIS — M170 Bilateral primary osteoarthritis of knee: Secondary | ICD-10-CM | POA: Diagnosis not present

## 2018-12-18 DIAGNOSIS — I639 Cerebral infarction, unspecified: Secondary | ICD-10-CM | POA: Diagnosis not present

## 2018-12-18 DIAGNOSIS — R7301 Impaired fasting glucose: Secondary | ICD-10-CM | POA: Diagnosis not present

## 2018-12-18 DIAGNOSIS — I129 Hypertensive chronic kidney disease with stage 1 through stage 4 chronic kidney disease, or unspecified chronic kidney disease: Secondary | ICD-10-CM | POA: Diagnosis not present

## 2018-12-18 DIAGNOSIS — N183 Chronic kidney disease, stage 3 (moderate): Secondary | ICD-10-CM | POA: Diagnosis not present

## 2018-12-24 DIAGNOSIS — J441 Chronic obstructive pulmonary disease with (acute) exacerbation: Secondary | ICD-10-CM | POA: Diagnosis not present

## 2018-12-28 ENCOUNTER — Other Ambulatory Visit (HOSPITAL_COMMUNITY): Payer: Self-pay | Admitting: *Deleted

## 2018-12-28 MED ORDER — POTASSIUM CHLORIDE CRYS ER 20 MEQ PO TBCR: 20.0000 meq | EXTENDED_RELEASE_TABLET | Freq: Three times a day (TID) | ORAL | 3 refills | Status: DC

## 2018-12-29 ENCOUNTER — Other Ambulatory Visit (HOSPITAL_COMMUNITY): Payer: Self-pay | Admitting: *Deleted

## 2018-12-29 DIAGNOSIS — C9001 Multiple myeloma in remission: Secondary | ICD-10-CM

## 2018-12-29 MED ORDER — LENALIDOMIDE 10 MG PO CAPS: ORAL_CAPSULE | ORAL | 0 refills | Status: DC

## 2018-12-29 NOTE — Telephone Encounter (Signed)
Chart reviewed, revlimid refilled. 

## 2018-12-30 ENCOUNTER — Other Ambulatory Visit (HOSPITAL_COMMUNITY): Payer: Self-pay | Admitting: Nurse Practitioner

## 2018-12-30 DIAGNOSIS — J441 Chronic obstructive pulmonary disease with (acute) exacerbation: Secondary | ICD-10-CM | POA: Diagnosis not present

## 2019-01-05 ENCOUNTER — Ambulatory Visit (INDEPENDENT_AMBULATORY_CARE_PROVIDER_SITE_OTHER): Payer: Medicare Other | Admitting: *Deleted

## 2019-01-05 DIAGNOSIS — I495 Sick sinus syndrome: Secondary | ICD-10-CM

## 2019-01-05 LAB — CUP PACEART REMOTE DEVICE CHECK
Battery Voltage: 2.93 V
Brady Statistic AP VP Percent: 1.76 %
Brady Statistic AP VS Percent: 77.43 %
Brady Statistic AS VP Percent: 0.22 %
Brady Statistic RA Percent Paced: 75.13 %
Brady Statistic RV Percent Paced: 1.98 %
Date Time Interrogation Session: 20200224174540
Implantable Lead Implant Date: 20120910
Implantable Lead Implant Date: 20120910
Implantable Lead Location: 753859
Implantable Lead Location: 753860
Implantable Pulse Generator Implant Date: 20120910
Lead Channel Impedance Value: 400 Ohm
Lead Channel Impedance Value: 408 Ohm
Lead Channel Sensing Intrinsic Amplitude: 5.386 mV
Lead Channel Setting Pacing Amplitude: 2 V
Lead Channel Setting Pacing Amplitude: 2.5 V
Lead Channel Setting Pacing Pulse Width: 0.6 ms
Lead Channel Setting Sensing Sensitivity: 0.9 mV
MDC IDC MSMT LEADCHNL RA SENSING INTR AMPL: 2.572 mV
MDC IDC STAT BRADY AS VS PERCENT: 20.58 %

## 2019-01-08 ENCOUNTER — Inpatient Hospital Stay (HOSPITAL_COMMUNITY): Payer: Medicare Other

## 2019-01-08 ENCOUNTER — Other Ambulatory Visit (HOSPITAL_COMMUNITY)
Admission: RE | Admit: 2019-01-08 | Discharge: 2019-01-08 | Disposition: A | Discharge status: Home / Self Care | Payer: Medicare Other | Source: Ambulatory Visit | Attending: Adult Health Nurse Practitioner | Admitting: Adult Health Nurse Practitioner

## 2019-01-08 DIAGNOSIS — R42 Dizziness and giddiness: Secondary | ICD-10-CM | POA: Diagnosis not present

## 2019-01-08 DIAGNOSIS — I2510 Atherosclerotic heart disease of native coronary artery without angina pectoris: Secondary | ICD-10-CM | POA: Insufficient documentation

## 2019-01-08 DIAGNOSIS — M170 Bilateral primary osteoarthritis of knee: Secondary | ICD-10-CM | POA: Diagnosis not present

## 2019-01-08 DIAGNOSIS — J449 Chronic obstructive pulmonary disease, unspecified: Secondary | ICD-10-CM | POA: Insufficient documentation

## 2019-01-08 DIAGNOSIS — I5032 Chronic diastolic (congestive) heart failure: Secondary | ICD-10-CM | POA: Diagnosis not present

## 2019-01-08 DIAGNOSIS — W19XXXA Unspecified fall, initial encounter: Secondary | ICD-10-CM | POA: Diagnosis not present

## 2019-01-08 DIAGNOSIS — R0782 Intercostal pain: Secondary | ICD-10-CM | POA: Diagnosis not present

## 2019-01-08 DIAGNOSIS — R0981 Nasal congestion: Secondary | ICD-10-CM | POA: Insufficient documentation

## 2019-01-08 DIAGNOSIS — Z23 Encounter for immunization: Secondary | ICD-10-CM | POA: Insufficient documentation

## 2019-01-08 DIAGNOSIS — D801 Nonfamilial hypogammaglobulinemia: Secondary | ICD-10-CM | POA: Diagnosis not present

## 2019-01-08 DIAGNOSIS — E782 Mixed hyperlipidemia: Secondary | ICD-10-CM | POA: Insufficient documentation

## 2019-01-08 DIAGNOSIS — J90 Pleural effusion, not elsewhere classified: Secondary | ICD-10-CM | POA: Insufficient documentation

## 2019-01-08 DIAGNOSIS — Z950 Presence of cardiac pacemaker: Secondary | ICD-10-CM | POA: Diagnosis not present

## 2019-01-08 DIAGNOSIS — E538 Deficiency of other specified B group vitamins: Secondary | ICD-10-CM | POA: Diagnosis not present

## 2019-01-08 DIAGNOSIS — Z8673 Personal history of transient ischemic attack (TIA), and cerebral infarction without residual deficits: Secondary | ICD-10-CM | POA: Insufficient documentation

## 2019-01-08 DIAGNOSIS — R7301 Impaired fasting glucose: Secondary | ICD-10-CM | POA: Diagnosis not present

## 2019-01-08 DIAGNOSIS — Z7901 Long term (current) use of anticoagulants: Secondary | ICD-10-CM | POA: Insufficient documentation

## 2019-01-08 DIAGNOSIS — M19019 Primary osteoarthritis, unspecified shoulder: Secondary | ICD-10-CM | POA: Insufficient documentation

## 2019-01-08 DIAGNOSIS — D509 Iron deficiency anemia, unspecified: Secondary | ICD-10-CM | POA: Insufficient documentation

## 2019-01-08 DIAGNOSIS — C9001 Multiple myeloma in remission: Secondary | ICD-10-CM | POA: Diagnosis not present

## 2019-01-08 DIAGNOSIS — J441 Chronic obstructive pulmonary disease with (acute) exacerbation: Secondary | ICD-10-CM | POA: Insufficient documentation

## 2019-01-08 DIAGNOSIS — Z8572 Personal history of non-Hodgkin lymphomas: Secondary | ICD-10-CM | POA: Insufficient documentation

## 2019-01-08 DIAGNOSIS — G9009 Other idiopathic peripheral autonomic neuropathy: Secondary | ICD-10-CM | POA: Diagnosis not present

## 2019-01-08 DIAGNOSIS — J302 Other seasonal allergic rhinitis: Secondary | ICD-10-CM | POA: Insufficient documentation

## 2019-01-08 DIAGNOSIS — B379 Candidiasis, unspecified: Secondary | ICD-10-CM | POA: Insufficient documentation

## 2019-01-08 DIAGNOSIS — R110 Nausea: Secondary | ICD-10-CM | POA: Insufficient documentation

## 2019-01-08 DIAGNOSIS — R1030 Lower abdominal pain, unspecified: Secondary | ICD-10-CM | POA: Diagnosis not present

## 2019-01-08 DIAGNOSIS — J189 Pneumonia, unspecified organism: Secondary | ICD-10-CM | POA: Insufficient documentation

## 2019-01-08 DIAGNOSIS — R0602 Shortness of breath: Secondary | ICD-10-CM | POA: Diagnosis not present

## 2019-01-08 DIAGNOSIS — C9000 Multiple myeloma not having achieved remission: Secondary | ICD-10-CM

## 2019-01-08 DIAGNOSIS — B356 Tinea cruris: Secondary | ICD-10-CM | POA: Insufficient documentation

## 2019-01-08 LAB — LIPID PANEL
Cholesterol: 112 mg/dL (ref 0–200)
HDL: 28 mg/dL — ABNORMAL LOW (ref >40)
LDL Cholesterol: 60 mg/dL (ref 0–99)
Total CHOL/HDL Ratio: 4 RATIO
Triglycerides: 118 mg/dL (ref <150)
VLDL: 24 mg/dL (ref 0–40)

## 2019-01-08 LAB — COMPREHENSIVE METABOLIC PANEL
ALT: 35 U/L (ref 0–44)
AST: 27 U/L (ref 15–41)
Albumin: 3.4 g/dL — ABNORMAL LOW (ref 3.5–5.0)
Alkaline Phosphatase: 88 U/L (ref 38–126)
Anion gap: 8 (ref 5–15)
BUN: 16 mg/dL (ref 8–23)
CO2: 26 mmol/L (ref 22–32)
Calcium: 9 mg/dL (ref 8.9–10.3)
Chloride: 103 mmol/L (ref 98–111)
Creatinine, Ser: 1.09 mg/dL (ref 0.61–1.24)
GFR calc Af Amer: >60 mL/min (ref >60)
GFR calc non Af Amer: >60 mL/min (ref >60)
Glucose, Bld: 108 mg/dL — ABNORMAL HIGH (ref 70–99)
Potassium: 4.3 mmol/L (ref 3.5–5.1)
Sodium: 137 mmol/L (ref 135–145)
Total Bilirubin: 0.5 mg/dL (ref 0.3–1.2)
Total Protein: 7.5 g/dL (ref 6.5–8.1)

## 2019-01-08 LAB — CBC WITH DIFFERENTIAL/PLATELET
Abs Immature Granulocytes: 0.04 10*3/uL (ref 0.00–0.07)
Basophils Absolute: 0.1 10*3/uL (ref 0.0–0.1)
Basophils Relative: 1 %
Eosinophils Absolute: 0.1 10*3/uL (ref 0.0–0.5)
Eosinophils Relative: 1 %
HCT: 38.1 % — ABNORMAL LOW (ref 39.0–52.0)
Hemoglobin: 11.7 g/dL — ABNORMAL LOW (ref 13.0–17.0)
Immature Granulocytes: 1 %
Lymphocytes Relative: 43 %
Lymphs Abs: 1.9 10*3/uL (ref 0.7–4.0)
MCH: 28.4 pg (ref 26.0–34.0)
MCHC: 30.7 g/dL (ref 30.0–36.0)
MCV: 92.5 fL (ref 80.0–100.0)
MONO ABS: 0.4 10*3/uL (ref 0.1–1.0)
MONOS PCT: 8 %
Neutro Abs: 2 10*3/uL (ref 1.7–7.7)
Neutrophils Relative %: 46 %
Platelets: 197 10*3/uL (ref 150–400)
RBC: 4.12 MIL/uL — ABNORMAL LOW (ref 4.22–5.81)
RDW: 15.8 % — ABNORMAL HIGH (ref 11.5–15.5)
WBC: 4.4 10*3/uL (ref 4.0–10.5)
nRBC: 0 % (ref 0.0–0.2)

## 2019-01-08 LAB — LACTATE DEHYDROGENASE: LDH: 125 U/L (ref 98–192)

## 2019-01-08 LAB — PSA: Prostatic Specific Antigen: 0.01 ng/mL (ref 0.00–4.00)

## 2019-01-08 LAB — HEMOGLOBIN A1C
Hgb A1c MFr Bld: 5.6 % (ref 4.8–5.6)
Mean Plasma Glucose: 114.02 mg/dL

## 2019-01-09 LAB — IGG, IGA, IGM
IgA: 541 mg/dL — ABNORMAL HIGH (ref 61–437)
IgG (Immunoglobin G), Serum: 1248 mg/dL (ref 700–1600)
IgM (Immunoglobulin M), Srm: 52 mg/dL (ref 15–143)

## 2019-01-11 ENCOUNTER — Inpatient Hospital Stay (HOSPITAL_COMMUNITY): Payer: Medicare Other | Attending: Internal Medicine

## 2019-01-11 ENCOUNTER — Ambulatory Visit (HOSPITAL_COMMUNITY): Payer: Medicare Other

## 2019-01-11 ENCOUNTER — Inpatient Hospital Stay (HOSPITAL_BASED_OUTPATIENT_CLINIC_OR_DEPARTMENT_OTHER): Payer: Medicare Other | Admitting: Internal Medicine

## 2019-01-11 ENCOUNTER — Other Ambulatory Visit: Payer: Self-pay

## 2019-01-11 ENCOUNTER — Encounter (HOSPITAL_COMMUNITY): Payer: Self-pay | Admitting: Internal Medicine

## 2019-01-11 ENCOUNTER — Ambulatory Visit (HOSPITAL_COMMUNITY): Payer: Medicare Other | Admitting: Hematology

## 2019-01-11 VITALS — BP 116/61 | HR 60 | Temp 98.0°F | Resp 18

## 2019-01-11 VITALS — BP 134/65 | HR 63 | Temp 98.1°F | Resp 18 | Wt 177.5 lb

## 2019-01-11 DIAGNOSIS — I69320 Aphasia following cerebral infarction: Secondary | ICD-10-CM | POA: Insufficient documentation

## 2019-01-11 DIAGNOSIS — D801 Nonfamilial hypogammaglobulinemia: Secondary | ICD-10-CM

## 2019-01-11 DIAGNOSIS — C9001 Multiple myeloma in remission: Secondary | ICD-10-CM

## 2019-01-11 DIAGNOSIS — E538 Deficiency of other specified B group vitamins: Secondary | ICD-10-CM

## 2019-01-11 LAB — IMMUNOFIXATION ELECTROPHORESIS
IgA: 572 mg/dL — ABNORMAL HIGH (ref 61–437)
IgG (Immunoglobin G), Serum: 1341 mg/dL (ref 700–1600)
IgM (Immunoglobulin M), Srm: 49 mg/dL (ref 15–143)
Total Protein ELP: 7 g/dL (ref 6.0–8.5)

## 2019-01-11 LAB — PROTEIN ELECTROPHORESIS, SERUM
A/G Ratio: 0.8 (ref 0.7–1.7)
Albumin ELP: 3 g/dL (ref 2.9–4.4)
Alpha-1-Globulin: 0.3 g/dL (ref 0.0–0.4)
Alpha-2-Globulin: 1.1 g/dL — ABNORMAL HIGH (ref 0.4–1.0)
Beta Globulin: 1.1 g/dL (ref 0.7–1.3)
Gamma Globulin: 1.3 g/dL (ref 0.4–1.8)
Globulin, Total: 3.9 g/dL (ref 2.2–3.9)
Total Protein ELP: 6.9 g/dL (ref 6.0–8.5)

## 2019-01-11 LAB — KAPPA/LAMBDA LIGHT CHAINS
Kappa free light chain: 60.7 mg/L — ABNORMAL HIGH (ref 3.3–19.4)
Kappa, lambda light chain ratio: 1.6 (ref 0.26–1.65)
Lambda free light chains: 38 mg/L — ABNORMAL HIGH (ref 5.7–26.3)

## 2019-01-11 MED ORDER — HEPARIN SOD (PORK) LOCK FLUSH 100 UNIT/ML IV SOLN
500.0000 [IU] | Freq: Once | INTRAVENOUS | Status: AC | PRN
  Administered 2019-01-11: 500 [IU]

## 2019-01-11 MED ORDER — SODIUM CHLORIDE 0.9% FLUSH
10.0000 mL | INTRAVENOUS | Status: DC | PRN
  Administered 2019-01-11: 10 mL via INTRAVENOUS
  Filled 2019-01-11: qty 10

## 2019-01-11 MED ORDER — IMMUNE GLOBULIN (HUMAN) 10 GM/100ML IV SOLN
400.0000 mg/kg | Freq: Once | INTRAVENOUS | Status: AC
  Administered 2019-01-11: 30 g via INTRAVENOUS
  Filled 2019-01-11: qty 100

## 2019-01-11 MED ORDER — SODIUM CHLORIDE 0.9 % IV SOLN
Freq: Once | INTRAVENOUS | Status: AC
  Administered 2019-01-11: 11:00:00 via INTRAVENOUS
  Filled 2019-01-11: qty 4

## 2019-01-11 MED ORDER — SODIUM CHLORIDE 0.9 % IJ SOLN
10.0000 mL | INTRAMUSCULAR | Status: DC | PRN
  Filled 2019-01-11: qty 10

## 2019-01-11 MED ORDER — CYANOCOBALAMIN 1000 MCG/ML IJ SOLN
1000.0000 ug | Freq: Once | INTRAMUSCULAR | Status: AC
  Administered 2019-01-11: 1000 ug via INTRAMUSCULAR
  Filled 2019-01-11: qty 1

## 2019-01-11 MED ORDER — DEXTROSE 5 % IV SOLN
INTRAVENOUS | Status: DC
  Administered 2019-01-11: 10:00:00 via INTRAVENOUS

## 2019-01-11 MED ORDER — ACETAMINOPHEN 325 MG PO TABS
650.0000 mg | ORAL_TABLET | Freq: Once | ORAL | Status: AC
  Administered 2019-01-11: 650 mg via ORAL
  Filled 2019-01-11: qty 2

## 2019-01-11 NOTE — Progress Notes (Signed)
Jeremy Parrish tolerated IVIG infusion and Vit B12 injection well without complaints or incident. VSS upon discharge. Pt discharged self ambulatory in satisfactory condition

## 2019-01-11 NOTE — Progress Notes (Signed)
Diagnosis Hypogammaglobulinemia ([place]) - Plan: CBC with Differential, Comprehensive metabolic panel, Lactate dehydrogenase  Staging Cancer Staging No matching staging information was found for the patient.  Assessment and Plan: 1.  IgG lambda multiple myeloma.  Pt is followed by Dr. Delton Coombes.   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  Pt on imodium.    Labs done 01/08/2019 reviewed and showed WBC 4.4 HB 11.7 plts 197,000.  Chemistries WNL with K+ 4.3 Cr 1 and normal LFTs. SPEP pending.  SPEP in 10/2018 showed no M-spike.    Pt will continue Revlimid maintenance, as he is continuing to be in remission.  Pt will follow-up with Dr. Delton Coombes in 1 month prior to next IVIG.    2.  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  Last infection was on 03/11/2018 when he had community-acquired pneumonia.  Pt was recommended by Dr. Delton Coombes to receive IVIG every 6 weeks.  Pt reports he is concerned about not getting IVIG every month.  IGG level 1248.  Pt will proceed with IVIG today and Will follow-up with Dr. Delton Coombes in 1 month to discuss.    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  His physical examination did not reveal any adenopathy or splenomegaly.  LDH normal at 125.  4.  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   5.  Vitamin B12 deficiency: - He is receiving B12 injections monthly.  25 minutes spent with more than 50% spent in review of records, counseling and coordination of care.     Current Status:  Pt is seen today for follow-up.  He reports he is concerned about not getting IVIG monthly and would like to go back to old schedule.    Problem List Patient Active Problem List   Diagnosis Date Noted  . History of colonic polyps [Z86.010] 05/25/2018  . CAP (community  acquired pneumonia) [J18.9] 03/11/2018  . COPD with acute exacerbation ([place]) [J44.1] 03/11/2018  . Vitamin B12 deficiency [E53.8]   . Long term current use of anticoagulant [Z79.01] 12/25/2016  . COPD (chronic obstructive pulmonary disease) ([place]) [J44.9] 04/18/2016  . Paroxysmal atrial fibrillation ([place]) [I48.0] 01/02/2016  . Hypokalemia [E87.6] 11/03/2014  . URI (upper respiratory infection) [J06.9] 11/03/2014  . Weakness [R53.1] 11/03/2014  . Obstructive sleep apnea [G47.33] 08/07/2014  . Central sleep apnea [G47.31] 08/07/2014  . Inguinal hernia [K40.90] 05/04/2014  . Chest pain with moderate risk for cardiac etiology [R07.9] 11/25/2013  . Fever [R50.9] 09/13/2013  . Pneumonia [J18.9] 09/13/2013  . Pancytopenia ([place]) [L39.030] 09/13/2013  . Muscle weakness (generalized) [M62.81] 08/04/2013  . Status post arthroscopy of shoulder [Z98.890] 08/04/2013  . Pain in joint, shoulder region [M25.519] 08/04/2013  . Rotator cuff tear arthropathy of right shoulder [M75.101, M12.811] 07/21/2013  . Left rotator cuff tear arthropathy [M75.102, M12.812] 07/21/2013  . Shortness of breath [R06.02] 07/07/2013  . 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 Hyperlipidemia with target LDL less than 100 [E78.5] 10/08/2012  . Prediabetes [R73.03] 10/08/2012  . Expressive aphasia [R47.01] 10/08/2012  . Hemiplegia affecting right dominant side ([place]) [G81.91] 10/08/2012  . H/O cardiac pacemaker, Medtronic REVO, MRI conditional device, placed 07/2011 for sympyomatic bradycardia [Z95.0] 10/07/2012  . Hx of bladder cancer [Z85.51] 10/07/2012  . Lt CVA with expressive aphasia Nov 2013 [I63.40] 10/06/2012  . Hypogammaglobulinemia ([place]) [D80.1]  09/28/2012  . ASTHMA, UNSPECIFIED [J45.909] 03/22/2010  . Nonspecific (abnormal) findings on radiological and other examination of body structure [793] 03/22/2010  . Hx of lymphoma [Z85.72] 03/21/2010  . Multiple myeloma in remission ([place]) [C90.01] 03/21/2010  .  Anxiety state [F41.1] 03/21/2010  . Essential hypertension [I10] 03/21/2010  . MYOCARDIAL INFARCTION [I21.9] 03/21/2010  . Coronary atherosclerosis [I25.10] 03/21/2010  . NEPHROLITHIASIS [N20.0] 03/21/2010  . ELEVATED PROSTATE SPECIFIC ANTIGEN [R97.20] 03/21/2010  . ROTATOR CUFF REPAIR, RIGHT, HX OF [Z98.89] 03/21/2010    Past Medical History Past Medical History:  Diagnosis Date  . Anemia   . Aortic aneurysm of unspecified site without mention of rupture   . Arthritis   . Bladder neck contracture   . Cancer ([place])   . Cerebral atherosclerosis    Carotid Doppler, 02/16/2013 - Bilateral Proximal ICAs,demonstrate mild plaque w/o evidence of significant diameter reduction, dissection, or any other vascular abnormality  . CHF (congestive heart failure) ([place])   . Complication of anesthesia   . COPD (chronic obstructive pulmonary disease) ([place])   . Coronary artery disease   . Depression   . Esophageal reflux   . Heart disease   . Heart murmur   . Hx of bladder cancer 10/07/2012  . Hyperlipidemia   . Hypertension   . Hypogammaglobulinemia ([place]) 09/28/2012   Secondary to Lymphoma and Multiple Myeloma and their treatments  . Intestinovesical fistula   . Kidney stones    history  . Lung mass   . Multiple myeloma   . Myocardial infarction [facility])    '96  . Non Hodgkin's lymphoma ([place])   . Paroxysmal atrial fibrillation ([place]) 01/02/2016  . Peripheral arterial disease ([place])   . Personal history of other diseases of circulatory system   . PONV (postoperative nausea and vomiting)   . Prostate cancer ([place]) 2000  . Shingles   . Shortness of breath   . Sleep apnea    05-02-14 cpap , not yet used- suggested settings 5  . Stroke [facility]) 2013   Speech.    Past Surgical History Past Surgical History:  Procedure Laterality Date  . BLADDER SURGERY    . BONE MARROW TRANSPLANT  2011  . COLON SURGERY     colon resection  . COLONOSCOPY N/A 01/01/2013   Procedure: COLONOSCOPY;  Surgeon: Rogene Houston, MD;  [facility];  Service: Endoscopy;  Laterality: N/A;  825-moved to [place] notified pt  . COLONOSCOPY N/A 07/16/2018   Procedure: COLONOSCOPY;  Surgeon: Rogene Houston, MD;  Location: [facility];  Service: Endoscopy;  Laterality: N/A;  1:25  . CORONARY ANGIOPLASTY  06/24/2000   PCI and stenting in mid & proximal RCA  . heart stents x 5  1999  . INGUINAL HERNIA REPAIR Right 05/04/2014   Procedure: OPEN RIGHT INGUINAL HERNIA REPAIR with mesh;  Surgeon: Edward Jolly, MD;  Location: [facility] OR[facility];  Service: General;  Laterality: Right;  . INSERT / REPLACE / REMOVE PACEMAKER    . left ear skin cancer removed    . NM MYOCAR PERF WALL MOTION  11/27/2007   inferior scar  . PACEMAKER INSERTION  07/22/2011   Medtronic  . POLYPECTOMY  07/16/2018   Procedure: POLYPECTOMY;  Surgeon: Rogene Houston, MD;  [facility];  Service: Endoscopy;;  colon  . PORTACATH PLACEMENT  07/26/2009   right chest  . PROSTATE SURGERY    . Rotator    . ROTATOR CUFF REPAIR Right   . SHOULDER  ARTHROSCOPY WITH SUBACROMIAL DECOMPRESSION Right 07/21/2013   Procedure: RIGHT SHOULDER ARTHROSCOPY WITH SUBACROMIAL DECOMPRESSION AND DEBRIDEMENT & Injection of Left Shoulder;  Surgeon: Alta Corning, MD;  Location: [place];  Service: Orthopedics;  Laterality: Right;  . TEE WITHOUT CARDIOVERSION  10/13/2012   Procedure: TRANSESOPHAGEAL ECHOCARDIOGRAM (TEE);  Surgeon: Sanda Klein, MD;  Location: [facility] ENDOSCOPY;  Service: Cardiovascular;  Laterality: N/A;  pat/kay/echo notified  . US ECHOCARDIOGRAPHY  06/19/2011   RV mildly dilated,mild to mod. MR,mild AI,mild PI  . WRIST SURGERY     right    Family History Family History  Problem Relation Age of Onset  . Cancer Father        bladder  . Heart disease Father        before age 75  . Hypertension Mother   . Cancer Brother   . Heart disease Brother        before age 8  . Heart disease Sister        before age 7  . Hyperlipidemia Sister   .  Hypertension Sister   . Heart attack Sister   . Colon cancer Neg Hx   . Colon polyps Neg Hx      Social History  reports that he quit smoking about 24 years ago. His smoking use included cigarettes. He has a 20.00 pack-year smoking history. He has never used smokeless tobacco. He reports that he does not drink alcohol or use drugs.  Medications  Current Outpatient Medications:  .  albuterol (PROVENTIL HFA;VENTOLIN HFA) 108 (90 Base) MCG/ACT inhaler, Inhale 2 puffs into the lungs every 6 (six) hours as needed for wheezing or shortness of breath., Disp: 1 Inhaler, Rfl: 3 .  albuterol (PROVENTIL) (2.5 MG/3ML) 0.083% nebulizer solution, Use via neb q 4 hours prn wheezing, Disp: , Rfl:  .  amLODipine (NORVASC) 10 MG tablet, TAKE 1 TABLET BY MOUTH EVERY DAY IN THE MORNING, Disp: 90 tablet, Rfl: 0 .  apixaban (ELIQUIS) 5 MG TABS tablet, Take by mouth., Disp: , Rfl:  .  citalopram (CELEXA) 40 MG tablet, TAKE 1 TABLET (40 MG TOTAL) BY MOUTH DAILY., Disp: 90 tablet, Rfl: 1 .  clopidogrel (PLAVIX) 75 MG tablet, TAKE 1 TABLET BY MOUTH EVERY DAY, Disp: 90 tablet, Rfl: 2 .  furosemide (LASIX) 20 MG tablet, Use sparingly as needed for edema. No more than one tablet by mouth per week. (Patient taking differently: Take 20 mg by mouth as needed. ), Disp: 21 tablet, Rfl: 0 .  lenalidomide (REVLIMID) 10 MG capsule, TAKE 1 CAPSULE BY MOUTH DAILY FOR 7 DAYS ON FOLLOWED BY 7 DAYS OFF. THEN REPEAT CYCLE., Disp: 14 capsule, Rfl: 0 .  lidocaine (XYLOCAINE) 2 % solution, , Disp: , Rfl:  .  mometasone (NASONEX) 50 MCG/ACT nasal spray, SPRAY 1 SPRAY INTO EACH NOSTRIL TWICE A DAY, Disp: , Rfl: 1 .  omeprazole (PRILOSEC) 20 MG capsule, Take 20 mg by mouth daily., Disp: , Rfl:  .  potassium chloride SA (KLOR-CON M20) 20 MEQ tablet, Take 1 tablet (20 mEq total) by mouth 3 (three) times daily., Disp: 270 tablet, Rfl: 3 .  simvastatin (ZOCOR) 20 MG tablet, TAKE 1 TABLET BY MOUTH EVERYDAY AT BEDTIME, Disp: 90 tablet, Rfl: 1 .   HYDROcodone-acetaminophen (NORCO/VICODIN) 5-325 MG tablet, Take 1 tablet by mouth 2 (two) times daily as needed. for pain, Disp: , Rfl: 0 .  levocetirizine (XYZAL) 5 MG tablet, Take 5 mg by mouth every evening. , Disp: , Rfl:  .  nitroGLYCERIN (NITROSTAT) 0.4 MG SL tablet, Place 1 tablet (0.4 mg total) under the tongue every 5 (five) minutes as needed for chest pain. (Patient not taking: Reported on 01/11/2019), Disp: 25 tablet, Rfl: 4 .  ondansetron (ZOFRAN) 4 MG tablet, TAKE 1 TABLET BY MOUTH EVERY 8 HOURS AS NEEDED FOR SEVERE NAUSEA, Disp: , Rfl: 1 No current facility-administered medications for this visit.   Facility-Administered Medications Ordered in Other Visits:  .  acetaminophen (TYLENOL) tablet 650 mg, 650 mg, Oral, Q6H PRN, Baird Cancer, PA-C, 650 mg at 12/09/14 0855 .  cyanocobalamin ((VITAMIN B-12)) injection 1,000 mcg, 1,000 mcg, Intramuscular, Once, Derek Jack, MD .  dextrose 5 % solution, , Intravenous, Continuous, Derek Jack, MD, Last Rate: 10 mL/hr at 01/11/19 1029 .  heparin lock flush 100 unit/mL, 500 Units, Intracatheter, Once PRN, Derek Jack, MD .  sodium chloride 0.9 % injection 10 mL, 10 mL, Intracatheter, PRN, Baird Cancer, PA-C, 10 mL at 12/09/14 0856 .  sodium chloride 0.9 % injection 10 mL, 10 mL, Intracatheter, PRN, Derek Jack, MD .  sodium chloride flush (NS) 0.9 % injection 10 mL, 10 mL, Intravenous, PRN, Phylliss Strege, MD, 10 mL at 01/11/19 1028  Allergies Morphine and related and Tape  Review of Systems Review of Systems - Oncology ROS negative   Physical Exam  Vitals Wt Readings from Last 3 Encounters:  01/11/19 177 lb 8 oz (80.5 kg)  12/01/18 186 lb 3.2 oz (84.5 kg)  10/19/18 186 lb 3.2 oz (84.5 kg)   Temp Readings from Last 3 Encounters:  01/11/19 98.4 F (36.9 C) (Oral)  01/11/19 98.1 F (36.7 C) (Oral)  12/14/18 98.6 F (37 C) (Oral)   BP Readings from Last 3 Encounters:  01/11/19 (!) 111/59   01/11/19 134/65  12/14/18 135/67   Pulse Readings from Last 3 Encounters:  01/11/19 60  01/11/19 63  12/14/18 72    Constitutional: Well-developed, well-nourished, and in no distress.   HENT: Head: Normocephalic and atraumatic.  Mouth/Throat: No oropharyngeal exudate. Mucosa moist. Eyes: Pupils are equal, round, and reactive to light. Conjunctivae are normal. No scleral icterus.  Neck: Normal range of motion. Neck supple. No JVD present.  Cardiovascular: Normal rate, regular rhythm and normal heart sounds.  Exam reveals no gallop and no friction rub.   No murmur heard. Pulmonary/Chest: Effort normal and breath sounds normal. No respiratory distress. No wheezes.No rales.  Abdominal: Soft. Bowel sounds are normal. No distension. There is no tenderness. There is no guarding.  Musculoskeletal: No edema or tenderness.  Lymphadenopathy: No cervical, axillary or supraclavicular adenopathy.  Neurological: Alert and oriented.  Evidence of prior stroke with expressive aphasia.   Skin: Skin is warm and dry. No rash noted. No erythema. No pallor.  Psychiatric: Affect and judgment normal.   Labs No visits with results within 3 Day(s) from this visit.  Latest known visit with results is:  Hospital Outpatient Visit on 01/08/2019  Component Date Value Ref Range Status  . Hgb A1c MFr Bld 01/08/2019 5.6  4.8 - 5.6 % Final   Comment: (NOTE) Pre diabetes:          5.7%-6.4% Diabetes:              >6.4% Glycemic control for   <7.0% adults with diabetes   . Mean Plasma Glucose 01/08/2019 114.02  mg/dL Final   Performed at [facility] [address], [place], [place] [address]  . Cholesterol 01/08/2019 112  0 - 200 mg/dL  Final  . Triglycerides 01/08/2019 118  <150 mg/dL Final  . HDL 01/08/2019 28* >40 mg/dL Final  . Total CHOL/HDL Ratio 01/08/2019 4.0  RATIO Final  . VLDL 01/08/2019 24  0 - 40 mg/dL Final  . LDL Cholesterol 01/08/2019 60  0 - 99 mg/dL Final   Comment:        Total  Cholesterol/HDL:CHD Risk Coronary Heart Disease Risk Table                     Men   Women  1/2 Average Risk   3.4   3.3  Average Risk       5.0   4.4  2 X Average Risk   9.6   7.1  3 X Average Risk  23.4   11.0        Use the calculated Patient Ratio above and the CHD Risk Table to determine the patient's CHD Risk.        ATP III CLASSIFICATION (LDL):  <100     mg/dL   Optimal  100-129  mg/dL   Near or Above                    Optimal  130-159  mg/dL   Borderline  160-189  mg/dL   High  >190     mg/dL   Very High Performed at [facility], [address], [place], [place] [address]   . Prostatic Specific Antigen 01/08/2019 0.01  0.00 - 4.00 ng/mL Final   Comment: (NOTE) While PSA levels of <=4.0 ng/ml are reported as reference range, some men with levels below 4.0 ng/ml can have prostate cancer and many men with PSA above 4.0 ng/ml do not have prostate cancer.  Other tests such as free PSA, age specific reference ranges, PSA velocity and PSA doubling time may be helpful especially in men less than 1 years old. Performed at [facility], [place] [address], [place], [place] [address]      Pathology Orders Placed This Encounter  Procedures  . CBC with Differential    Standing Status:   Future    Standing Expiration Date:   01/11/2020  . Comprehensive metabolic panel    Standing Status:   Future    Standing Expiration Date:   01/11/2020  . Lactate dehydrogenase    Standing Status:   Future    Standing Expiration Date:   01/11/2020       Zoila Shutter MD

## 2019-01-11 NOTE — Patient Instructions (Addendum)
[place] at [facility] Discharge Instructions  Received IVIG infusion and Vit B12 injection today. Follow-up as scheduled. Call clinic for any questions or concerns   Thank you for choosing [place] at [facility] to provide your oncology and hematology care.  To afford each patient quality time with our provider, please arrive at least 15 minutes before your scheduled appointment time.   If you have a lab appointment with the [facility] please come in thru the  Main Entrance and check in at the main information desk  You need to re-schedule your appointment should you arrive 10 or more minutes late.  We strive to give you quality time with our providers, and arriving late affects you and other patients whose appointments are after yours.  Also, if you no show three or more times for appointments you may be dismissed from the clinic at the providers discretion.     Again, thank you for choosing [facility].  Our hope is that these requests will decrease the amount of time that you wait before being seen by our physicians.       _____________________________________________________________  Should you have questions after your visit to [facility], please contact our office at [phone] between the hours of 8:00 a.m. and 4:30 p.m.  Voicemails left after 4:00 p.m. will not be returned until the following business day.  For prescription refill requests, have your pharmacy contact our office and allow 72 hours.    Cancer Center Support Programs:   > Cancer Support Group  2nd Tuesday of the month 1pm-2pm, Journey Room

## 2019-01-12 ENCOUNTER — Encounter: Payer: Self-pay | Admitting: Cardiology

## 2019-01-12 NOTE — Progress Notes (Signed)
Remote pacemaker transmission.   

## 2019-01-25 ENCOUNTER — Other Ambulatory Visit (HOSPITAL_COMMUNITY): Payer: Self-pay | Admitting: *Deleted

## 2019-01-25 DIAGNOSIS — C9001 Multiple myeloma in remission: Secondary | ICD-10-CM

## 2019-01-25 MED ORDER — LENALIDOMIDE 10 MG PO CAPS: ORAL_CAPSULE | ORAL | 0 refills | Status: DC

## 2019-01-25 NOTE — Telephone Encounter (Signed)
Chart reviewed, per Dr. Walden Field last note, revlimid refilled under Dr. Delton Coombes.

## 2019-01-27 DIAGNOSIS — J449 Chronic obstructive pulmonary disease, unspecified: Secondary | ICD-10-CM | POA: Diagnosis not present

## 2019-01-27 DIAGNOSIS — R040 Epistaxis: Secondary | ICD-10-CM | POA: Diagnosis not present

## 2019-01-27 DIAGNOSIS — G4700 Insomnia, unspecified: Secondary | ICD-10-CM | POA: Diagnosis not present

## 2019-01-27 DIAGNOSIS — R0782 Intercostal pain: Secondary | ICD-10-CM | POA: Diagnosis not present

## 2019-02-05 ENCOUNTER — Other Ambulatory Visit: Payer: Self-pay

## 2019-02-05 ENCOUNTER — Inpatient Hospital Stay (HOSPITAL_COMMUNITY): Payer: Medicare Other

## 2019-02-05 DIAGNOSIS — D801 Nonfamilial hypogammaglobulinemia: Secondary | ICD-10-CM

## 2019-02-05 DIAGNOSIS — C9001 Multiple myeloma in remission: Secondary | ICD-10-CM | POA: Diagnosis not present

## 2019-02-05 DIAGNOSIS — I69320 Aphasia following cerebral infarction: Secondary | ICD-10-CM | POA: Diagnosis not present

## 2019-02-05 DIAGNOSIS — E538 Deficiency of other specified B group vitamins: Secondary | ICD-10-CM | POA: Diagnosis not present

## 2019-02-05 LAB — CBC WITH DIFFERENTIAL/PLATELET
Abs Immature Granulocytes: 0.01 10*3/uL (ref 0.00–0.07)
Basophils Absolute: 0.1 10*3/uL (ref 0.0–0.1)
Basophils Relative: 1 %
Eosinophils Absolute: 0.1 10*3/uL (ref 0.0–0.5)
Eosinophils Relative: 1 %
HCT: 34.8 % — ABNORMAL LOW (ref 39.0–52.0)
Hemoglobin: 10.9 g/dL — ABNORMAL LOW (ref 13.0–17.0)
Immature Granulocytes: 0 %
LYMPHS ABS: 2.2 10*3/uL (ref 0.7–4.0)
Lymphocytes Relative: 41 %
MCH: 28.9 pg (ref 26.0–34.0)
MCHC: 31.3 g/dL (ref 30.0–36.0)
MCV: 92.3 fL (ref 80.0–100.0)
MONO ABS: 0.5 10*3/uL (ref 0.1–1.0)
MONOS PCT: 10 %
Neutro Abs: 2.5 10*3/uL (ref 1.7–7.7)
Neutrophils Relative %: 47 %
Platelets: 122 10*3/uL — ABNORMAL LOW (ref 150–400)
RBC: 3.77 MIL/uL — ABNORMAL LOW (ref 4.22–5.81)
RDW: 16.2 % — ABNORMAL HIGH (ref 11.5–15.5)
WBC: 5.3 10*3/uL (ref 4.0–10.5)
nRBC: 0 % (ref 0.0–0.2)

## 2019-02-05 LAB — COMPREHENSIVE METABOLIC PANEL
ALT: 19 U/L (ref 0–44)
AST: 18 U/L (ref 15–41)
Albumin: 3.4 g/dL — ABNORMAL LOW (ref 3.5–5.0)
Alkaline Phosphatase: 69 U/L (ref 38–126)
Anion gap: 4 — ABNORMAL LOW (ref 5–15)
BUN: 9 mg/dL (ref 8–23)
CO2: 25 mmol/L (ref 22–32)
CREATININE: 0.91 mg/dL (ref 0.61–1.24)
Calcium: 8.6 mg/dL — ABNORMAL LOW (ref 8.9–10.3)
Chloride: 110 mmol/L (ref 98–111)
GFR calc non Af Amer: >60 mL/min (ref >60)
Glucose, Bld: 84 mg/dL (ref 70–99)
Potassium: 3.9 mmol/L (ref 3.5–5.1)
Sodium: 139 mmol/L (ref 135–145)
Total Bilirubin: 0.5 mg/dL (ref 0.3–1.2)
Total Protein: 6.9 g/dL (ref 6.5–8.1)

## 2019-02-05 LAB — LACTATE DEHYDROGENASE: LDH: 117 U/L (ref 98–192)

## 2019-02-08 ENCOUNTER — Other Ambulatory Visit: Payer: Self-pay

## 2019-02-08 ENCOUNTER — Inpatient Hospital Stay (HOSPITAL_COMMUNITY): Payer: Medicare Other

## 2019-02-08 ENCOUNTER — Encounter (HOSPITAL_COMMUNITY): Payer: Self-pay | Admitting: Nurse Practitioner

## 2019-02-08 ENCOUNTER — Inpatient Hospital Stay (HOSPITAL_BASED_OUTPATIENT_CLINIC_OR_DEPARTMENT_OTHER): Payer: Medicare Other | Admitting: Nurse Practitioner

## 2019-02-08 VITALS — BP 122/60 | HR 61 | Temp 97.7°F | Resp 18

## 2019-02-08 VITALS — BP 131/62 | HR 62 | Temp 98.0°F | Resp 17 | Wt 182.7 lb

## 2019-02-08 DIAGNOSIS — D801 Nonfamilial hypogammaglobulinemia: Secondary | ICD-10-CM

## 2019-02-08 DIAGNOSIS — E538 Deficiency of other specified B group vitamins: Secondary | ICD-10-CM

## 2019-02-08 DIAGNOSIS — C9001 Multiple myeloma in remission: Secondary | ICD-10-CM

## 2019-02-08 DIAGNOSIS — I69320 Aphasia following cerebral infarction: Secondary | ICD-10-CM | POA: Diagnosis not present

## 2019-02-08 MED ORDER — SODIUM CHLORIDE 0.9% FLUSH
10.0000 mL | INTRAVENOUS | Status: DC | PRN
  Administered 2019-02-08: 10 mL via INTRAVENOUS
  Filled 2019-02-08 (×2): qty 10

## 2019-02-08 MED ORDER — CYANOCOBALAMIN 1000 MCG/ML IJ SOLN
1000.0000 ug | Freq: Once | INTRAMUSCULAR | Status: AC
  Administered 2019-02-08: 1000 ug via INTRAMUSCULAR
  Filled 2019-02-08: qty 1

## 2019-02-08 MED ORDER — SODIUM CHLORIDE 0.9 % IJ SOLN
10.0000 mL | INTRAMUSCULAR | Status: DC | PRN
  Filled 2019-02-08: qty 10

## 2019-02-08 MED ORDER — DEXTROSE 5 % IV SOLN
INTRAVENOUS | Status: DC
  Administered 2019-02-08: 09:00:00 via INTRAVENOUS

## 2019-02-08 MED ORDER — SODIUM CHLORIDE 0.9 % IV SOLN
Freq: Once | INTRAVENOUS | Status: AC
  Administered 2019-02-08: 10:00:00 via INTRAVENOUS
  Filled 2019-02-08: qty 4

## 2019-02-08 MED ORDER — ACETAMINOPHEN 325 MG PO TABS
650.0000 mg | ORAL_TABLET | Freq: Once | ORAL | Status: AC
  Administered 2019-02-08: 650 mg via ORAL
  Filled 2019-02-08: qty 2

## 2019-02-08 MED ORDER — HEPARIN SOD (PORK) LOCK FLUSH 100 UNIT/ML IV SOLN
500.0000 [IU] | Freq: Once | INTRAVENOUS | Status: AC | PRN
  Administered 2019-02-08: 500 [IU]

## 2019-02-08 MED ORDER — IMMUNE GLOBULIN (HUMAN) 10 GM/100ML IV SOLN
400.0000 mg/kg | Freq: Once | INTRAVENOUS | Status: AC
  Administered 2019-02-08: 35 g via INTRAVENOUS
  Filled 2019-02-08: qty 200

## 2019-02-08 NOTE — Progress Notes (Signed)
[place], [place] [id number]   CLINIC:  Medical Oncology/Hematology  PCP:  Celene Squibb, MD [place] [address] [phone]   REASON FOR VISIT: Follow-up for Multiple myeloma AND hypogammaglobulinemia AND vitamin B12 deficiency  CURRENT THERAPY: Observation AND every 6 weeks IVIG infusions AND monthly B12 injections   INTERVAL HISTORY:  Jeremy Parrish 78 y.o. male returns for routine follow-up multiple myeloma, hypogammaglobulinemia, and vitamin B12 deficiency.  He is doing very well today.  He reports he did have one sinus infection in January of this year and received a round of antibiotics.  He has not had any further infections.  He does complain about some shortness of breath with exertion due to his COPD.  Otherwise he is doing well with no other complaints. Denies any nausea, vomiting, or diarrhea. Denies any new pains. Had not noticed any recent bleeding such as epistaxis, hematuria or hematochezia. Denies recent chest pain on exertion, shortness of breath on minimal exertion, pre-syncopal episodes, or palpitations. Denies any numbness or tingling in hands or feet. Denies any recent fevers, infections, or recent hospitalizations. Patient reports appetite at 100% and energy level at 50%.  He is eating well and maintaining his weight at this time.   REVIEW OF SYSTEMS:  Review of Systems  Respiratory: Positive for shortness of breath (with exertion from COPD).   All other systems reviewed and are negative.    PAST MEDICAL/SURGICAL HISTORY:  Past Medical History:  Diagnosis Date  . Anemia   . Aortic aneurysm of unspecified site without mention of rupture   . Arthritis   . Bladder neck contracture   . Cancer ([place])   . Cerebral atherosclerosis    Carotid Doppler, 02/16/2013 - Bilateral Proximal ICAs,demonstrate mild plaque w/o evidence of significant diameter reduction, dissection, or any other vascular abnormality  . CHF  (congestive heart failure) ([place])   . Complication of anesthesia   . COPD (chronic obstructive pulmonary disease) ([place])   . Coronary artery disease   . Depression   . Esophageal reflux   . Heart disease   . Heart murmur   . Hx of bladder cancer 10/07/2012  . Hyperlipidemia   . Hypertension   . 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 Intestinovesical fistula   . Kidney stones    history  . Lung mass   . Multiple myeloma   . Myocardial infarction [facility])    '96  . Non Hodgkin's lymphoma ([place])   . Paroxysmal atrial fibrillation ([place]) 01/02/2016  . Peripheral arterial disease ([place])   . Personal history of other diseases of circulatory system   . PONV (postoperative nausea and vomiting)   . Prostate cancer ([place]) 2000  . Shingles   . Shortness of breath   . Sleep apnea    05-02-14 cpap , not yet used- suggested settings 5  . Stroke [facility]) 2013   Speech.   Past Surgical History:  Procedure Laterality Date  . BLADDER SURGERY    . BONE MARROW TRANSPLANT  2011  . COLON SURGERY     colon resection  . COLONOSCOPY N/A 01/01/2013   Procedure: COLONOSCOPY;  Surgeon: Rogene Houston, MD;  [facility];  Service: Endoscopy;  Laterality: N/A;  825-moved to [place] notified pt  . COLONOSCOPY N/A 07/16/2018   Procedure: COLONOSCOPY;  Surgeon: Rogene Houston, MD;  Location: [facility] ENDO SUITE;  Service: Endoscopy;  Laterality: N/A;  1:25  . CORONARY ANGIOPLASTY  06/24/2000   PCI and stenting in mid & proximal RCA  . heart stents x 5  1999  . INGUINAL HERNIA REPAIR Right 05/04/2014   Procedure: OPEN RIGHT INGUINAL HERNIA REPAIR with mesh;  Surgeon: Edward Jolly, MD;  Location: [facility] ORS;  Service: General;  Laterality: Right;  . INSERT / REPLACE / REMOVE PACEMAKER    . left ear skin cancer removed    . NM MYOCAR PERF WALL MOTION  11/27/2007   inferior scar  . PACEMAKER INSERTION  07/22/2011   [employer]  . POLYPECTOMY   07/16/2018   Procedure: POLYPECTOMY;  Surgeon: Rogene Houston, MD;  Location: [facility];  Service: Endoscopy;;  colon  . PORTACATH PLACEMENT  07/26/2009   right chest  . PROSTATE SURGERY    . Rotator    . ROTATOR CUFF REPAIR Right   . SHOULDER ARTHROSCOPY WITH SUBACROMIAL DECOMPRESSION Right 07/21/2013   Procedure: RIGHT SHOULDER ARTHROSCOPY WITH SUBACROMIAL DECOMPRESSION AND DEBRIDEMENT & Injection of Left Shoulder;  Surgeon: Alta Corning, MD;  Location: [place];  Service: Orthopedics;  Laterality: Right;  . TEE WITHOUT CARDIOVERSION  10/13/2012   Procedure: TRANSESOPHAGEAL ECHOCARDIOGRAM (TEE);  Surgeon: Sanda Klein, MD;  Location: [facility] ENDOSCOPY;  Service: Cardiovascular;  Laterality: N/A;  pat/kay/echo notified  . US ECHOCARDIOGRAPHY  06/19/2011   RV mildly dilated,mild to mod. MR,mild AI,mild PI  . WRIST SURGERY     right     SOCIAL HISTORY:  Social History   Socioeconomic History  . Marital status: Married    Spouse name: [name]  . Number of children: 3  . Years of education: 9th  . Highest education level: Not on file  Occupational History  . Occupation: retired   Scientific laboratory technician  . Financial resource strain: Not on file  . Food insecurity:    Worry: Not on file    Inability: Not on file  . Transportation needs:    Medical: Not on file    Non-medical: Not on file  Tobacco Use  . Smoking status: Former Smoker    Packs/day: 1.00    Years: 20.00    Pack years: 20.00    Types: Cigarettes    Last attempt to quit: 11/14/1994    Years since quitting: 24.2  . Smokeless tobacco: Never Used  Substance and Sexual Activity  . Alcohol use: No    Alcohol/week: 0.0 standard drinks    Comment: previously drank but none for at least 15 years.  . Drug use: No  . Sexual activity: Not on file  Lifestyle  . Physical activity:    Days per week: Not on file    Minutes per session: Not on file  . Stress: Not on file  Relationships  . Social connections:    Talks on phone: Not on  file    Gets together: Not on file    Attends religious service: Not on file    Active member of club or organization: Not on file    Attends meetings of clubs or organizations: Not on file    Relationship status: Not on file  . Intimate partner violence:    Fear of current or ex partner: Not on file    Emotionally abused: Not on file    Physically abused: Not on file    Forced sexual activity: Not on file  Other Topics Concern  . Not on file  Social History Narrative  Patient lives at home spouse.   Caffeine Use: Occasionally    FAMILY HISTORY:  Family History  Problem Relation Age of Onset  . Cancer Father        bladder  . Heart disease Father        before age 18  . Hypertension Mother   . Cancer Brother   . Heart disease Brother        before age 63  . Heart disease Sister        before age 5  . Hyperlipidemia Sister   . Hypertension Sister   . Heart attack Sister   . Colon cancer Neg Hx   . Colon polyps Neg Hx     CURRENT MEDICATIONS:  Outpatient Encounter Medications as of 02/08/2019  Medication Sig  . amLODipine (NORVASC) 10 MG tablet TAKE 1 TABLET BY MOUTH EVERY DAY IN THE MORNING  . apixaban (ELIQUIS) 5 MG TABS tablet Take by mouth.  . clopidogrel (PLAVIX) 75 MG tablet TAKE 1 TABLET BY MOUTH EVERY DAY  . furosemide (LASIX) 20 MG tablet Use sparingly as needed for edema. No more than one tablet by mouth per week. (Patient taking differently: Take 20 mg by mouth as needed. )  . lenalidomide (REVLIMID) 10 MG capsule TAKE 1 CAPSULE BY MOUTH DAILY FOR 7 DAYS ON FOLLOWED BY 7 DAYS OFF. THEN REPEAT CYCLE.  Marland Kitchen levocetirizine (XYZAL) 5 MG tablet Take 5 mg by mouth every evening.   . lidocaine (XYLOCAINE) 2 % solution   . mometasone (NASONEX) 50 MCG/ACT nasal spray SPRAY 1 SPRAY INTO EACH NOSTRIL TWICE A DAY  . omeprazole (PRILOSEC) 20 MG capsule Take 20 mg by mouth daily.  . potassium chloride SA (KLOR-CON M20) 20 MEQ tablet Take 1 tablet (20 mEq total) by mouth 3  (three) times daily.  . simvastatin (ZOCOR) 20 MG tablet TAKE 1 TABLET BY MOUTH EVERYDAY AT BEDTIME  . albuterol (PROVENTIL HFA;VENTOLIN HFA) 108 (90 Base) MCG/ACT inhaler Inhale 2 puffs into the lungs every 6 (six) hours as needed for wheezing or shortness of breath. (Patient not taking: Reported on 02/08/2019)  . albuterol (PROVENTIL) (2.5 MG/3ML) 0.083% nebulizer solution Use via neb q 4 hours prn wheezing  . citalopram (CELEXA) 40 MG tablet TAKE 1 TABLET (40 MG TOTAL) BY MOUTH DAILY. (Patient not taking: Reported on 02/08/2019)  . HYDROcodone-acetaminophen (NORCO/VICODIN) 5-325 MG tablet Take 1 tablet by mouth 2 (two) times daily as needed. for pain  . nitroGLYCERIN (NITROSTAT) 0.4 MG SL tablet Place 1 tablet (0.4 mg total) under the tongue every 5 (five) minutes as needed for chest pain. (Patient not taking: Reported on 02/08/2019)  . ondansetron (ZOFRAN) 4 MG tablet TAKE 1 TABLET BY MOUTH EVERY 8 HOURS AS NEEDED FOR SEVERE NAUSEA   Facility-Administered Encounter Medications as of 02/08/2019  Medication  . acetaminophen (TYLENOL) tablet 650 mg  . sodium chloride 0.9 % injection 10 mL    ALLERGIES:  Allergies  Allergen Reactions  . Morphine And Related Other (See Comments)    hallucinations  . Tape Rash    Paper tape is ok     PHYSICAL EXAM:  ECOG Performance status: 1  Vitals:   02/08/19 0835  BP: 131/62  Pulse: 62  Resp: 17  Temp: 98 F (36.7 C)  SpO2: 99%   Filed Weights   02/08/19 0835  Weight: 182 lb 11.2 oz (82.9 kg)    Physical Exam Constitutional:      Appearance: Normal appearance. He is normal weight.  Cardiovascular:     Rate and Rhythm: Normal rate and regular rhythm.     Heart sounds: Normal heart sounds.  Pulmonary:     Effort: Pulmonary effort is normal.     Breath sounds: Normal breath sounds.  Abdominal:     General: Bowel sounds are normal.     Palpations: Abdomen is soft.  Musculoskeletal: Normal range of motion.  Skin:    General: Skin is  warm and dry.  Neurological:     Mental Status: He is alert and oriented to person, place, and time. Mental status is at baseline.  Psychiatric:        Mood and Affect: Mood normal.        Behavior: Behavior normal.        Thought Content: Thought content normal.        Judgment: Judgment normal.      LABORATORY DATA:  I have reviewed the labs as listed.  CBC    Component Value Date/Time   WBC 5.3 02/05/2019 1055   RBC 3.77 (L) 02/05/2019 1055   HGB 10.9 (L) 02/05/2019 1055   HCT 34.8 (L) 02/05/2019 1055   PLT 122 (L) 02/05/2019 1055   MCV 92.3 02/05/2019 1055   MCH 28.9 02/05/2019 1055   MCHC 31.3 02/05/2019 1055   RDW 16.2 (H) 02/05/2019 1055   LYMPHSABS 2.2 02/05/2019 1055   MONOABS 0.5 02/05/2019 1055   EOSABS 0.1 02/05/2019 1055   BASOSABS 0.1 02/05/2019 1055   CMP Latest Ref Rng & Units 02/05/2019 01/08/2019 11/27/2018  Glucose 70 - 99 mg/dL 84 108(H) 105(H)  BUN 8 - 23 mg/dL _0 Creatinine 0.61 - 1.24 mg/dL 0.91 1.09 1.23  Sodium 135 - 145 mmol/L 139 137 140  Potassium 3.5 - 5.1 mmol/L 3.9 4.3 4.2  Chloride 98 - 111 mmol/L 110 103 105  CO2 22 - 32 mmol/L _1 Calcium 8.9 - 10.3 mg/dL 8.6(L) 9.0 8.5(L)  Total Protein 6.5 - 8.1 g/dL 6.9 7.5 7.3  Total Bilirubin 0.3 - 1.2 mg/dL 0.5 0.5 0.7  Alkaline Phos 38 - 126 U/L 69 88 60  AST 15 - 41 U/L _2 ALT 0 - 44 U/L 19 35 18     I personally performed a face-to-face visit.    ASSESSMENT & PLAN:   Multiple myeloma in remission 1.  IgG lambda multiple myeloma: - Status post auto stem cell transplant at [place] on 08/24/2009. -On Revlimid maintenance 10 mg 7 days on 7 days off since 2011, well-tolerated except for occasional diarrhea. -Patient will continue Revlimid maintenance, as he is continuing to be in remission. - Follow-up in 3 months with repeat myeloma labs 1 week prior.  2.  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 -His last infection was on  03/11/2018 when he had community-acquired pneumonia. -He was recently changed to every 6 weeks for his IVIG infusions due to very few infections and IgG level normal. - He will continue getting IVIG every 6 weeks. - He will follow-up in 3 months with repeat labs.  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 - Upon physical examination he did not have any adenopathy or splenomegaly. - Labs on 02/05/2019 showed LDH at 117.  4. 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 This is stable at this time.   5.  Vitamin B12 deficiency: - He is receiving  monthly B12 injections. -We will recheck his levels on his next visit. - We will see him back in 3 months with labs.  6.  Maintenance: - Last colonoscopy 07/16/2018, which showed a 12 mm polyp in the hepatic flexure.  Also found 3 polyps in the sigmoid and transverse colon.  All which were removed and came back benign.  He also had diverticulosis in the sigmoid colon.      Orders placed this encounter:  Orders Placed This Encounter  Procedures  . Lactate dehydrogenase  . Protein electrophoresis, serum  . Kappa/lambda light chains  . CBC with Differential/Platelet  . Comprehensive metabolic panel  . Ferritin  . Iron and TIBC  . Vitamin B12  . VITAMIN D 25 Hydroxy (Vit-D Deficiency, Fractures)  . Folate  . IgG, IgA, IgM      Francene Finders FNP-C [facility] [phone]

## 2019-02-08 NOTE — Assessment & Plan Note (Addendum)
1.  IgG lambda multiple myeloma: - Status post auto stem cell transplant at [place] on 08/24/2009. -On Revlimid maintenance 10 mg 7 days on 7 days off since 2011, well-tolerated except for occasional diarrhea. -Patient will continue Revlimid maintenance, as he is continuing to be in remission. - Follow-up in 3 months with repeat myeloma labs 1 week prior.  2.  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 -His last infection was on 03/11/2018 when he had community-acquired pneumonia. -He was recently changed to every 6 weeks for his IVIG infusions due to very few infections and IgG level normal. - He will continue getting IVIG every 6 weeks. - He will follow-up in 3 months with repeat labs.  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 - Upon physical examination he did not have any adenopathy or splenomegaly. - Labs on 02/05/2019 showed LDH at 117.  4. 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 This is stable at this time.   5.  Vitamin B12 deficiency: - He is receiving monthly B12 injections. -We will recheck his levels on his next visit. - We will see him back in 3 months with labs.  6.  Maintenance: - Last colonoscopy 07/16/2018, which showed a 12 mm polyp in the hepatic flexure.  Also found 3 polyps in the sigmoid and transverse colon.  All which were removed and came back benign.  He also had diverticulosis in the sigmoid colon.

## 2019-02-08 NOTE — Progress Notes (Signed)
0915 Labs reviewed with and pt seen by Francene Finders NP today and pt approved for IVIG infusion per NP                                                                               [name] tolerated IVIG infusion and Vit B12 injection well without complaints or incident. VSS upon discharge. Pt discharged self ambulatory in satisfactory condition

## 2019-02-08 NOTE — Patient Instructions (Signed)
[place] at [facility] Discharge Instructions  [place] at [facility] Discharge Instructions  Received IVIG infusion and Vit B12 injection today. Follow-up as scheduled. Call clinic for any questions or concerns   Thank you for choosing [place] at [facility] to provide your oncology and hematology care.  To afford each patient quality time with our provider, please arrive at least 15 minutes before your scheduled appointment time.   If you have a lab appointment with the [facility] please come in thru the  Main Entrance and check in at the main information desk  You need to re-schedule your appointment should you arrive 10 or more minutes late.  We strive to give you quality time with our providers, and arriving late affects you and other patients whose appointments are after yours.  Also, if you no show three or more times for appointments you may be dismissed from the clinic at the providers discretion.     Again, thank you for choosing [facility].  Our hope is that these requests will decrease the amount of time that you wait before being seen by our physicians.       _____________________________________________________________  Should you have questions after your visit to [facility], please contact our office at [phone] between the hours of 8:00 a.m. and 4:30 p.m.  Voicemails left after 4:00 p.m. will not be returned until the following business day.  For prescription refill requests, have your pharmacy contact our office and allow 72 hours.    Cancer Center Support Programs:   > Cancer Support Group  2nd Tuesday of the month 1pm-2pm, Journey Room     Thank you for choosing [place] at [facility] to provide your oncology and hematology care.  To afford each patient quality time with our provider, please arrive at least 15 minutes before your  scheduled appointment time.   If you have a lab appointment with the [facility] please come in thru the  Main Entrance and check in at the main information desk  You need to re-schedule your appointment should you arrive 10 or more minutes late.  We strive to give you quality time with our providers, and arriving late affects you and other patients whose appointments are after yours.  Also, if you no show three or more times for appointments you may be dismissed from the clinic at the providers discretion.     Again, thank you for choosing [facility].  Our hope is that these requests will decrease the amount of time that you wait before being seen by our physicians.       _____________________________________________________________  Should you have questions after your visit to [facility], please contact our office at [phone] between the hours of 8:00 a.m. and 4:30 p.m.  Voicemails left after 4:00 p.m. will not be returned until the following business day.  For prescription refill requests, have your pharmacy contact our office and allow 72 hours.    Cancer Center Support Programs:   > Cancer Support Group  2nd Tuesday of the month 1pm-2pm, Journey Room

## 2019-02-08 NOTE — Patient Instructions (Signed)
[place] at [facility] Discharge Instructions    Follow up in 6 weeks for office visit. Please have labs drawn 1 week prior to your scheduled appointment. Continue your monthly B12 injections. Continue your every 6 week IVIG infusions.     Thank you for choosing [place] at [facility] to provide your oncology and hematology care.  To afford each patient quality time with our provider, please arrive at least 15 minutes before your scheduled appointment time.   If you have a lab appointment with the [facility] please come in thru the  Main Entrance and check in at the main information desk  You need to re-schedule your appointment should you arrive 10 or more minutes late.  We strive to give you quality time with our providers, and arriving late affects you and other patients whose appointments are after yours.  Also, if you no show three or more times for appointments you may be dismissed from the clinic at the providers discretion.     Again, thank you for choosing [facility].  Our hope is that these requests will decrease the amount of time that you wait before being seen by our physicians.       _____________________________________________________________  Should you have questions after your visit to [facility], please contact our office at [phone] between the hours of 8:00 a.m. and 4:30 p.m.  Voicemails left after 4:00 p.m. will not be returned until the following business day.  For prescription refill requests, have your pharmacy contact our office and allow 72 hours.    Cancer Center Support Programs:   > Cancer Support Group  2nd Tuesday of the month 1pm-2pm, Journey Room

## 2019-02-15 ENCOUNTER — Telehealth: Payer: Self-pay

## 2019-02-15 NOTE — Telephone Encounter (Signed)
   Primary Cardiologist:  No primary care provider on file.   Patient contacted.  History reviewed.  No symptoms to suggest any unstable cardiac conditions.  Based on discussion, with current pandemic situation, we will be postponing this appointment for Jeremy Parrish with a plan for f/u in >12 wks or sooner if feasible/necessary.  If symptoms change, he has been instructed to contact our office.   Routing to C19 CANCEL pool for tracking (P CV DIV CV19 CANCEL - reason for visit "other.") and assigning priority (1 = 4-6 wks, 2 = 6-12 wks, 3 = >12 wks).   Jeremy Parrish, [place]  02/15/2019 11:26 AM

## 2019-02-15 NOTE — Telephone Encounter (Signed)
   Cardiac Questionnaire:    Since your last visit or hospitalization:    1. Have you been having new or worsening chest pain? NO   2. Have you been having new or worsening shortness of breath? NO 3. Have you been having new or worsening leg swelling, wt gain, or increase in abdominal girth (pants fitting more tightly)? NO   4. Have you had any passing out spells? NO     

## 2019-02-17 ENCOUNTER — Encounter: Payer: Medicare Other | Admitting: Cardiovascular Disease

## 2019-02-22 ENCOUNTER — Other Ambulatory Visit (HOSPITAL_COMMUNITY): Payer: Self-pay | Admitting: *Deleted

## 2019-02-22 DIAGNOSIS — C9001 Multiple myeloma in remission: Secondary | ICD-10-CM

## 2019-02-22 MED ORDER — LENALIDOMIDE 10 MG PO CAPS: ORAL_CAPSULE | ORAL | 0 refills | Status: DC

## 2019-03-05 ENCOUNTER — Other Ambulatory Visit (HOSPITAL_COMMUNITY): Payer: Medicare Other

## 2019-03-08 ENCOUNTER — Ambulatory Visit (HOSPITAL_COMMUNITY): Payer: Medicare Other

## 2019-03-11 ENCOUNTER — Ambulatory Visit (HOSPITAL_COMMUNITY): Payer: Medicare Other

## 2019-03-12 DIAGNOSIS — D0461 Carcinoma in situ of skin of right upper limb, including shoulder: Secondary | ICD-10-CM | POA: Diagnosis not present

## 2019-03-12 DIAGNOSIS — L820 Inflamed seborrheic keratosis: Secondary | ICD-10-CM | POA: Diagnosis not present

## 2019-03-12 DIAGNOSIS — D485 Neoplasm of uncertain behavior of skin: Secondary | ICD-10-CM | POA: Diagnosis not present

## 2019-03-12 DIAGNOSIS — D692 Other nonthrombocytopenic purpura: Secondary | ICD-10-CM | POA: Diagnosis not present

## 2019-03-12 DIAGNOSIS — C44629 Squamous cell carcinoma of skin of left upper limb, including shoulder: Secondary | ICD-10-CM | POA: Diagnosis not present

## 2019-03-12 DIAGNOSIS — L570 Actinic keratosis: Secondary | ICD-10-CM | POA: Diagnosis not present

## 2019-03-12 DIAGNOSIS — L821 Other seborrheic keratosis: Secondary | ICD-10-CM | POA: Diagnosis not present

## 2019-03-12 DIAGNOSIS — L814 Other melanin hyperpigmentation: Secondary | ICD-10-CM | POA: Diagnosis not present

## 2019-03-15 ENCOUNTER — Other Ambulatory Visit (HOSPITAL_COMMUNITY): Payer: Self-pay | Admitting: *Deleted

## 2019-03-15 DIAGNOSIS — C9001 Multiple myeloma in remission: Secondary | ICD-10-CM

## 2019-03-15 DIAGNOSIS — R350 Frequency of micturition: Secondary | ICD-10-CM | POA: Diagnosis not present

## 2019-03-15 DIAGNOSIS — C61 Malignant neoplasm of prostate: Secondary | ICD-10-CM | POA: Diagnosis not present

## 2019-03-15 DIAGNOSIS — N320 Bladder-neck obstruction: Secondary | ICD-10-CM | POA: Diagnosis not present

## 2019-03-15 MED ORDER — LENALIDOMIDE 10 MG PO CAPS: ORAL_CAPSULE | ORAL | 0 refills | Status: DC

## 2019-03-19 ENCOUNTER — Inpatient Hospital Stay (HOSPITAL_COMMUNITY): Payer: Medicare Other | Attending: Hematology

## 2019-03-19 ENCOUNTER — Other Ambulatory Visit: Payer: Self-pay

## 2019-03-19 DIAGNOSIS — C9001 Multiple myeloma in remission: Secondary | ICD-10-CM | POA: Insufficient documentation

## 2019-03-19 DIAGNOSIS — C9000 Multiple myeloma not having achieved remission: Secondary | ICD-10-CM

## 2019-03-19 DIAGNOSIS — D801 Nonfamilial hypogammaglobulinemia: Secondary | ICD-10-CM | POA: Diagnosis not present

## 2019-03-19 DIAGNOSIS — E538 Deficiency of other specified B group vitamins: Secondary | ICD-10-CM | POA: Insufficient documentation

## 2019-03-19 LAB — CBC WITH DIFFERENTIAL/PLATELET
Abs Immature Granulocytes: 0.01 10*3/uL (ref 0.00–0.07)
Basophils Absolute: 0.1 10*3/uL (ref 0.0–0.1)
Basophils Relative: 2 %
Eosinophils Absolute: 0.1 10*3/uL (ref 0.0–0.5)
Eosinophils Relative: 2 %
HCT: 34.9 % — ABNORMAL LOW (ref 39.0–52.0)
Hemoglobin: 10.9 g/dL — ABNORMAL LOW (ref 13.0–17.0)
Immature Granulocytes: 0 %
Lymphocytes Relative: 51 %
Lymphs Abs: 2.3 10*3/uL (ref 0.7–4.0)
MCH: 29.4 pg (ref 26.0–34.0)
MCHC: 31.2 g/dL (ref 30.0–36.0)
MCV: 94.1 fL (ref 80.0–100.0)
Monocytes Absolute: 0.4 10*3/uL (ref 0.1–1.0)
Monocytes Relative: 9 %
Neutro Abs: 1.7 10*3/uL (ref 1.7–7.7)
Neutrophils Relative %: 36 %
Platelets: 138 10*3/uL — ABNORMAL LOW (ref 150–400)
RBC: 3.71 MIL/uL — ABNORMAL LOW (ref 4.22–5.81)
RDW: 16.5 % — ABNORMAL HIGH (ref 11.5–15.5)
WBC: 4.6 10*3/uL (ref 4.0–10.5)
nRBC: 0 % (ref 0.0–0.2)

## 2019-03-19 LAB — COMPREHENSIVE METABOLIC PANEL
ALT: 13 U/L (ref 0–44)
AST: 20 U/L (ref 15–41)
Albumin: 3.6 g/dL (ref 3.5–5.0)
Alkaline Phosphatase: 66 U/L (ref 38–126)
Anion gap: 7 (ref 5–15)
BUN: 15 mg/dL (ref 8–23)
CO2: 25 mmol/L (ref 22–32)
Calcium: 8.7 mg/dL — ABNORMAL LOW (ref 8.9–10.3)
Chloride: 108 mmol/L (ref 98–111)
Creatinine, Ser: 1.06 mg/dL (ref 0.61–1.24)
GFR calc Af Amer: >60 mL/min (ref >60)
GFR calc non Af Amer: >60 mL/min (ref >60)
Glucose, Bld: 132 mg/dL — ABNORMAL HIGH (ref 70–99)
Potassium: 3.7 mmol/L (ref 3.5–5.1)
Sodium: 140 mmol/L (ref 135–145)
Total Bilirubin: 0.8 mg/dL (ref 0.3–1.2)
Total Protein: 7.1 g/dL (ref 6.5–8.1)

## 2019-03-22 ENCOUNTER — Other Ambulatory Visit: Payer: Self-pay

## 2019-03-22 ENCOUNTER — Encounter (HOSPITAL_COMMUNITY): Payer: Self-pay

## 2019-03-22 ENCOUNTER — Ambulatory Visit (HOSPITAL_COMMUNITY): Payer: Medicare Other

## 2019-03-22 ENCOUNTER — Inpatient Hospital Stay (HOSPITAL_COMMUNITY): Payer: Medicare Other

## 2019-03-22 VITALS — BP 122/67 | HR 66 | Temp 97.4°F | Resp 18 | Wt 184.8 lb

## 2019-03-22 DIAGNOSIS — E538 Deficiency of other specified B group vitamins: Secondary | ICD-10-CM

## 2019-03-22 DIAGNOSIS — D801 Nonfamilial hypogammaglobulinemia: Secondary | ICD-10-CM

## 2019-03-22 DIAGNOSIS — C9001 Multiple myeloma in remission: Secondary | ICD-10-CM | POA: Diagnosis not present

## 2019-03-22 MED ORDER — SODIUM CHLORIDE 0.9 % IJ SOLN: 3.0000 mL | Freq: Once | INTRAMUSCULAR | Status: DC | PRN

## 2019-03-22 MED ORDER — DEXTROSE 5 % IV SOLN
INTRAVENOUS | Status: DC
  Administered 2019-03-22: 09:00:00 via INTRAVENOUS

## 2019-03-22 MED ORDER — ACETAMINOPHEN 325 MG PO TABS
650.0000 mg | ORAL_TABLET | Freq: Once | ORAL | Status: AC
  Administered 2019-03-22: 650 mg via ORAL
  Filled 2019-03-22: qty 2

## 2019-03-22 MED ORDER — ALTEPLASE 2 MG IJ SOLR: 2.0000 mg | Freq: Once | INTRAMUSCULAR | Status: DC | PRN

## 2019-03-22 MED ORDER — OCTREOTIDE ACETATE 30 MG IM KIT
PACK | INTRAMUSCULAR | Status: AC
  Filled 2019-03-22: qty 1

## 2019-03-22 MED ORDER — SODIUM CHLORIDE 0.9 % IJ SOLN: 10.0000 mL | INTRAMUSCULAR | Status: DC | PRN

## 2019-03-22 MED ORDER — SODIUM CHLORIDE 0.9% FLUSH
10.0000 mL | Freq: Once | INTRAVENOUS | Status: AC
  Administered 2019-03-22: 10 mL via INTRAVENOUS

## 2019-03-22 MED ORDER — CYANOCOBALAMIN 1000 MCG/ML IJ SOLN
1000.0000 ug | Freq: Once | INTRAMUSCULAR | Status: AC
  Administered 2019-03-22: 1000 ug via INTRAMUSCULAR
  Filled 2019-03-22: qty 1

## 2019-03-22 MED ORDER — DENOSUMAB 120 MG/1.7ML ~~LOC~~ SOLN
SUBCUTANEOUS | Status: AC
  Filled 2019-03-22: qty 1.7

## 2019-03-22 MED ORDER — FULVESTRANT 250 MG/5ML IM SOLN
INTRAMUSCULAR | Status: AC
  Filled 2019-03-22: qty 5

## 2019-03-22 MED ORDER — DARBEPOETIN ALFA 500 MCG/ML IJ SOSY
PREFILLED_SYRINGE | INTRAMUSCULAR | Status: AC
  Filled 2019-03-22: qty 1

## 2019-03-22 MED ORDER — SODIUM CHLORIDE 0.9 % IV SOLN
Freq: Once | INTRAVENOUS | Status: AC
  Administered 2019-03-22: 09:00:00 via INTRAVENOUS
  Filled 2019-03-22: qty 4

## 2019-03-22 MED ORDER — HEPARIN SOD (PORK) LOCK FLUSH 100 UNIT/ML IV SOLN
500.0000 [IU] | Freq: Once | INTRAVENOUS | Status: AC | PRN
  Administered 2019-03-22: 500 [IU]

## 2019-03-22 MED ORDER — IMMUNE GLOBULIN (HUMAN) 10 GM/100ML IV SOLN
400.0000 mg/kg | Freq: Once | INTRAVENOUS | Status: AC
  Administered 2019-03-22: 10:00:00 35 g via INTRAVENOUS
  Filled 2019-03-22: qty 100

## 2019-03-22 MED ORDER — HEPARIN SOD (PORK) LOCK FLUSH 100 UNIT/ML IV SOLN: 250.0000 [IU] | Freq: Once | INTRAVENOUS | Status: DC | PRN

## 2019-03-22 NOTE — Progress Notes (Signed)
Pt presents today for IVIG and B12 injection. VSS. Pt has no complaints of any pain today. Pt states, " I feel a little dizzy this morning." Pt did not take any medications this morning prior to visit. MAR reviewed and updated.   IVIG and B12 given today per MD orders. Tolerated infusion without adverse affects. Vital signs stable. No complaints at this time. Discharged from clinic ambulatory. F/U with [facility] as scheduled.

## 2019-03-22 NOTE — Patient Instructions (Signed)
[facility] at [facility]  Discharge Instructions:   _______________________________________________________________  Thank you for choosing [facility] at [facility] to provide your oncology and hematology care.  To afford each patient quality time with our providers, please arrive at least 15 minutes before your scheduled appointment.  You need to re-schedule your appointment if you arrive 10 or more minutes late.  We strive to give you quality time with our providers, and arriving late affects you and other patients whose appointments are after yours.  Also, if you no show three or more times for appointments you may be dismissed from the clinic.  Again, thank you for choosing [facility] at [facility]. Our hope is that these requests will allow you access to exceptional care and in a timely manner. _______________________________________________________________  If you have questions after your visit, please contact our office at [phone] between the hours of 8:30 a.m. and 5:00 p.m. Voicemails left after 4:30 p.m. will not be returned until the following business day. _______________________________________________________________  For prescription refill requests, have your pharmacy contact our office. _______________________________________________________________  Recommendations made by the consultant and any test results will be sent to your referring physician. _______________________________________________________________ 

## 2019-03-30 DIAGNOSIS — L905 Scar conditions and fibrosis of skin: Secondary | ICD-10-CM | POA: Diagnosis not present

## 2019-03-30 DIAGNOSIS — D0461 Carcinoma in situ of skin of right upper limb, including shoulder: Secondary | ICD-10-CM | POA: Diagnosis not present

## 2019-03-30 DIAGNOSIS — C44629 Squamous cell carcinoma of skin of left upper limb, including shoulder: Secondary | ICD-10-CM | POA: Diagnosis not present

## 2019-04-06 ENCOUNTER — Ambulatory Visit (INDEPENDENT_AMBULATORY_CARE_PROVIDER_SITE_OTHER): Payer: Medicare Other | Admitting: *Deleted

## 2019-04-06 DIAGNOSIS — I495 Sick sinus syndrome: Secondary | ICD-10-CM

## 2019-04-06 DIAGNOSIS — I480 Paroxysmal atrial fibrillation: Secondary | ICD-10-CM

## 2019-04-06 LAB — CUP PACEART REMOTE DEVICE CHECK
Battery Voltage: 2.92 V
Brady Statistic AP VP Percent: 1 %
Brady Statistic AP VS Percent: 87.87 %
Brady Statistic AS VP Percent: 0.04 %
Brady Statistic AS VS Percent: 11.09 %
Brady Statistic RA Percent Paced: 86.77 %
Brady Statistic RV Percent Paced: 1.07 %
Date Time Interrogation Session: 20200525142333
Implantable Lead Implant Date: 20120910
Implantable Lead Implant Date: 20120910
Implantable Lead Location: 753859
Implantable Lead Location: 753860
Implantable Pulse Generator Implant Date: 20120910
Lead Channel Impedance Value: 376 Ohm
Lead Channel Impedance Value: 392 Ohm
Lead Channel Sensing Intrinsic Amplitude: 1.996 mV
Lead Channel Sensing Intrinsic Amplitude: 3.366 mV
Lead Channel Setting Pacing Amplitude: 2 V
Lead Channel Setting Pacing Amplitude: 2.5 V
Lead Channel Setting Pacing Pulse Width: 0.6 ms
Lead Channel Setting Sensing Sensitivity: 0.9 mV

## 2019-04-08 ENCOUNTER — Other Ambulatory Visit (HOSPITAL_COMMUNITY): Payer: Self-pay | Admitting: *Deleted

## 2019-04-08 DIAGNOSIS — C9001 Multiple myeloma in remission: Secondary | ICD-10-CM

## 2019-04-08 MED ORDER — LENALIDOMIDE 10 MG PO CAPS: ORAL_CAPSULE | ORAL | 0 refills | Status: DC

## 2019-04-08 NOTE — Telephone Encounter (Signed)
Chart reviewed, revlimid refilled. 

## 2019-04-13 ENCOUNTER — Other Ambulatory Visit: Payer: Self-pay | Admitting: Cardiovascular Disease

## 2019-04-14 NOTE — Progress Notes (Signed)
Remote pacemaker transmission.   

## 2019-04-26 ENCOUNTER — Ambulatory Visit (HOSPITAL_COMMUNITY): Payer: Medicare Other

## 2019-04-26 ENCOUNTER — Other Ambulatory Visit: Payer: Self-pay

## 2019-04-26 ENCOUNTER — Other Ambulatory Visit (HOSPITAL_COMMUNITY): Payer: Medicare Other

## 2019-04-27 ENCOUNTER — Inpatient Hospital Stay (HOSPITAL_COMMUNITY): Payer: Medicare Other

## 2019-04-27 ENCOUNTER — Inpatient Hospital Stay (HOSPITAL_COMMUNITY): Payer: Medicare Other | Attending: Hematology

## 2019-04-27 ENCOUNTER — Other Ambulatory Visit: Payer: Self-pay

## 2019-04-27 ENCOUNTER — Inpatient Hospital Stay (HOSPITAL_BASED_OUTPATIENT_CLINIC_OR_DEPARTMENT_OTHER): Payer: Medicare Other | Admitting: Hematology

## 2019-04-27 ENCOUNTER — Encounter (HOSPITAL_COMMUNITY): Payer: Self-pay | Admitting: Hematology

## 2019-04-27 DIAGNOSIS — E538 Deficiency of other specified B group vitamins: Secondary | ICD-10-CM | POA: Diagnosis not present

## 2019-04-27 DIAGNOSIS — D801 Nonfamilial hypogammaglobulinemia: Secondary | ICD-10-CM | POA: Diagnosis not present

## 2019-04-27 DIAGNOSIS — K5730 Diverticulosis of large intestine without perforation or abscess without bleeding: Secondary | ICD-10-CM

## 2019-04-27 DIAGNOSIS — C9001 Multiple myeloma in remission: Secondary | ICD-10-CM | POA: Diagnosis not present

## 2019-04-27 DIAGNOSIS — Z8673 Personal history of transient ischemic attack (TIA), and cerebral infarction without residual deficits: Secondary | ICD-10-CM

## 2019-04-27 LAB — FOLATE: Folate: 12.8 ng/mL (ref >5.9)

## 2019-04-27 LAB — COMPREHENSIVE METABOLIC PANEL
ALT: 16 U/L (ref 0–44)
AST: 19 U/L (ref 15–41)
Albumin: 3.6 g/dL (ref 3.5–5.0)
Alkaline Phosphatase: 72 U/L (ref 38–126)
Anion gap: 9 (ref 5–15)
BUN: 11 mg/dL (ref 8–23)
CO2: 25 mmol/L (ref 22–32)
Calcium: 9 mg/dL (ref 8.9–10.3)
Chloride: 106 mmol/L (ref 98–111)
Creatinine, Ser: 1.11 mg/dL (ref 0.61–1.24)
GFR calc Af Amer: >60 mL/min (ref >60)
GFR calc non Af Amer: >60 mL/min (ref >60)
Glucose, Bld: 86 mg/dL (ref 70–99)
Potassium: 3.4 mmol/L — ABNORMAL LOW (ref 3.5–5.1)
Sodium: 140 mmol/L (ref 135–145)
Total Bilirubin: 0.5 mg/dL (ref 0.3–1.2)
Total Protein: 7.4 g/dL (ref 6.5–8.1)

## 2019-04-27 LAB — CBC WITH DIFFERENTIAL/PLATELET
Abs Immature Granulocytes: 0.02 10*3/uL (ref 0.00–0.07)
Basophils Absolute: 0.1 10*3/uL (ref 0.0–0.1)
Basophils Relative: 1 %
Eosinophils Absolute: 0.3 10*3/uL (ref 0.0–0.5)
Eosinophils Relative: 5 %
HCT: 36.5 % — ABNORMAL LOW (ref 39.0–52.0)
Hemoglobin: 11.4 g/dL — ABNORMAL LOW (ref 13.0–17.0)
Immature Granulocytes: 0 %
Lymphocytes Relative: 61 %
Lymphs Abs: 3 10*3/uL (ref 0.7–4.0)
MCH: 28.5 pg (ref 26.0–34.0)
MCHC: 31.2 g/dL (ref 30.0–36.0)
MCV: 91.3 fL (ref 80.0–100.0)
Monocytes Absolute: 0.5 10*3/uL (ref 0.1–1.0)
Monocytes Relative: 9 %
Neutro Abs: 1.2 10*3/uL — ABNORMAL LOW (ref 1.7–7.7)
Neutrophils Relative %: 24 %
Platelets: 118 10*3/uL — ABNORMAL LOW (ref 150–400)
RBC: 4 MIL/uL — ABNORMAL LOW (ref 4.22–5.81)
RDW: 14.7 % (ref 11.5–15.5)
WBC: 5 10*3/uL (ref 4.0–10.5)
nRBC: 0 % (ref 0.0–0.2)

## 2019-04-27 LAB — IRON AND TIBC
Iron: 65 ug/dL (ref 45–182)
Saturation Ratios: 18 % (ref 17.9–39.5)
TIBC: 356 ug/dL (ref 250–450)
UIBC: 291 ug/dL

## 2019-04-27 LAB — LACTATE DEHYDROGENASE: LDH: 120 U/L (ref 98–192)

## 2019-04-27 LAB — VITAMIN B12: Vitamin B-12: 301 pg/mL (ref 180–914)

## 2019-04-27 LAB — FERRITIN: Ferritin: 44 ng/mL (ref 24–336)

## 2019-04-27 MED ORDER — CYANOCOBALAMIN 1000 MCG/ML IJ SOLN
1000.0000 ug | Freq: Once | INTRAMUSCULAR | Status: AC
  Administered 2019-04-27: 1000 ug via INTRAMUSCULAR
  Filled 2019-04-27: qty 1

## 2019-04-27 NOTE — Progress Notes (Signed)
Patient tolerated injection with no complaints voiced.  Site clean and dry with no bruising or swelling noted at site.  Band aid applied.  Vss with discharge and left ambulatory with no s/s of distress noted.  

## 2019-04-27 NOTE — Assessment & Plan Note (Signed)
1.  IgG lambda multiple myeloma: - Status post auto stem cell transplant at [place] on 08/24/2009. -On Revlimid maintenance 5 mg 7 days on 7 days off since 2011, well-tolerated except for occasional diarrhea. -Patient will continue Revlimid maintenance, as he is continuing to be in remission. MM labs pending today.  - Follow-up in 3 months with repeat myeloma labs 1 week prior.  2.  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 -His last infection was on 03/11/2018 when he had community-acquired pneumonia. -He was recently changed to every 6 weeks for his IVIG infusions due to very few infections and IgG level normal. - He will continue getting IVIG every 6 weeks.   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 - Upon physical examination he did not have any adenopathy or splenomegaly. - LDH : 120 today  4. 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 This is stable at this time.   5.  Vitamin B12 deficiency: - He is receiving monthly B12 injections. Vb12 remains low normal at 301. We will continue with Vb12 injections.    6.  Maintenance: - Last colonoscopy 07/16/2018, which showed a 12 mm polyp in the hepatic flexure.  Also found 3 polyps in the sigmoid and transverse colon.  All which were removed and came back benign.  He also had diverticulosis in the sigmoid colon.

## 2019-04-27 NOTE — Progress Notes (Signed)
Jeremy Parrish, [place] [address]   CLINIC:  Medical Oncology/Hematology  PCP:  Celene Squibb, MD [place] [address] [phone]   REASON FOR VISIT:  Follow-up for Hypogammaglobulinemia  CURRENT THERAPY: IVIG    INTERVAL HISTORY:  Jeremy Parrish 78 y.o. male today for follow-up.  He reports overall doing well.  He denies any recent or recurrent infections.  Denies any fevers, chills, night sweats.  He remains on Revlimid maintenance 5 mg 7 days on 7 days off.  He continues with reports of diarrhea this is controlled with as needed Imodium.  He denies any lymphadenopathy.  Denies any abdominal pain, change in bowel habits, or appetite loss.  Overall is doing well.   REVIEW OF SYSTEMS:  Review of Systems  Constitutional: Negative.   HENT:  Negative.   Eyes: Negative.   Respiratory: Negative.   Cardiovascular: Negative.   Gastrointestinal: Negative.   Endocrine: Negative.   Genitourinary: Negative.    Musculoskeletal: Negative.   Skin: Negative.   Neurological: Negative.   Hematological: Negative.   Psychiatric/Behavioral: Negative.      PAST MEDICAL/SURGICAL HISTORY:  Past Medical History:  Diagnosis Date  . Anemia   . Aortic aneurysm of unspecified site without mention of rupture   . Arthritis   . Bladder neck contracture   . Cancer ([place])   . Cerebral atherosclerosis    Carotid Doppler, 02/16/2013 - Bilateral Proximal ICAs,demonstrate mild plaque w/o evidence of significant diameter reduction, dissection, or any other vascular abnormality  . CHF (congestive heart failure) ([place])   . Complication of anesthesia   . COPD (chronic obstructive pulmonary disease) ([place])   . Coronary artery disease   . Depression   . Esophageal reflux   . Heart disease   . Heart murmur   . Hx of bladder cancer 10/07/2012  . Hyperlipidemia   . Hypertension   . Hypogammaglobulinemia ([place]) 09/28/2012   Secondary to Lymphoma and  Multiple Myeloma and their treatments  . Intestinovesical fistula   . Kidney stones    history  . Lung mass   . Multiple myeloma   . Myocardial infarction [facility])    '96  . Non Hodgkin's lymphoma ([place])   . Paroxysmal atrial fibrillation ([place]) 01/02/2016  . Peripheral arterial disease ([place])   . Personal history of other diseases of circulatory system   . PONV (postoperative nausea and vomiting)   . Prostate cancer ([place]) 2000  . Shingles   . Shortness of breath   . Sleep apnea    05-02-14 cpap , not yet used- suggested settings 5  . Stroke [facility]) 2013   Speech.   Past Surgical History:  Procedure Laterality Date  . BLADDER SURGERY    . BONE MARROW TRANSPLANT  2011  . COLON SURGERY     colon resection  . COLONOSCOPY N/A 01/01/2013   Procedure: COLONOSCOPY;  Surgeon: Rogene Houston, MD;  Location: [facility] ENDO [facility];  Service: Endoscopy;  Laterality: N/A;  825-moved to [place] notified pt  . COLONOSCOPY N/A 07/16/2018   Procedure: COLONOSCOPY;  Surgeon: Rogene Houston, MD;  Location: [facility] ENDO SUITE;  Service: Endoscopy;  Laterality: N/A;  1:25  . CORONARY ANGIOPLASTY  06/24/2000   PCI and stenting in mid & proximal RCA  . heart stents x 5  1999  . INGUINAL HERNIA REPAIR Right 05/04/2014   Procedure: OPEN RIGHT INGUINAL HERNIA REPAIR with mesh;  Surgeon: Edward Jolly, MD;  Location: [facility] ORS;  Service: General;  Laterality: Right;  . INSERT / REPLACE / REMOVE PACEMAKER    . left ear skin cancer removed    . NM MYOCAR PERF WALL MOTION  11/27/2007   inferior scar  . PACEMAKER INSERTION  07/22/2011   [employer]  . POLYPECTOMY  07/16/2018   Procedure: POLYPECTOMY;  Surgeon: Rogene Houston, MD;  Location: [facility];  Service: Endoscopy;;  colon  . PORTACATH PLACEMENT  07/26/2009   right chest  . PROSTATE SURGERY    . Rotator    . ROTATOR CUFF REPAIR Right   . SHOULDER ARTHROSCOPY WITH SUBACROMIAL DECOMPRESSION Right 07/21/2013   Procedure: RIGHT SHOULDER ARTHROSCOPY WITH SUBACROMIAL  DECOMPRESSION AND DEBRIDEMENT & Injection of Left Shoulder;  Surgeon: Alta Corning, MD;  Location: [facility];  Service: Orthopedics;  Laterality: Right;  . TEE WITHOUT CARDIOVERSION  10/13/2012   Procedure: TRANSESOPHAGEAL ECHOCARDIOGRAM (TEE);  Surgeon: Sanda Klein, MD;  Location: [facility] ENDOSCOPY;  Service: Cardiovascular;  Laterality: N/A;  pat/kay/echo notified  . US ECHOCARDIOGRAPHY  06/19/2011   RV mildly dilated,mild to mod. MR,mild AI,mild PI  . WRIST SURGERY     right     SOCIAL HISTORY:  Social History   Socioeconomic History  . Marital status: Married    Spouse name: [name]  . Number of children: 3  . Years of education: 9th  . Highest education level: Not on file  Occupational History  . Occupation: retired   Scientific laboratory technician  . Financial resource strain: Not on file  . Food insecurity    Worry: Not on file    Inability: Not on file  . Transportation needs    Medical: Not on file    Non-medical: Not on file  Tobacco Use  . Smoking status: Former Smoker    Packs/day: 1.00    Years: 20.00    Pack years: 20.00    Types: Cigarettes    Quit date: 11/14/1994    Years since quitting: 24.4  . Smokeless tobacco: Never Used  Substance and Sexual Activity  . Alcohol use: No    Alcohol/week: 0.0 standard drinks    Comment: previously drank but none for at least 15 years.  . Drug use: No  . Sexual activity: Not on file  Lifestyle  . Physical activity    Days per week: Not on file    Minutes per session: Not on file  . Stress: Not on file  Relationships  . Social Herbalist on phone: Not on file    Gets together: Not on file    Attends religious service: Not on file    Active member of club or organization: Not on file    Attends meetings of clubs or organizations: Not on file    Relationship status: Not on file  . Intimate partner violence    Fear of current or ex partner: Not on file    Emotionally abused: Not on file    Physically abused: Not on file     Forced sexual activity: Not on file  Other Topics Concern  . Not on file  Social History Narrative   Patient lives at home spouse.   Caffeine Use: Occasionally    FAMILY HISTORY:  Family History  Problem Relation Age of Onset  . Cancer Father        bladder  . Heart disease Father        before age 77  . Hypertension Mother   .  Cancer Brother   . Heart disease Brother        before age 46  . Heart disease Sister        before age 47  . Hyperlipidemia Sister   . Hypertension Sister   . Heart attack Sister   . Colon cancer Neg Hx   . Colon polyps Neg Hx     CURRENT MEDICATIONS:  Outpatient Encounter Medications as of 04/27/2019  Medication Sig  . albuterol (PROVENTIL HFA;VENTOLIN HFA) 108 (90 Base) MCG/ACT inhaler Inhale 2 puffs into the lungs every 6 (six) hours as needed for wheezing or shortness of breath.  Marland Kitchen albuterol (PROVENTIL) (2.5 MG/3ML) 0.083% nebulizer solution Use via neb q 4 hours prn wheezing  . amLODipine (NORVASC) 10 MG tablet TAKE 1 TABLET BY MOUTH EVERY DAY IN THE MORNING  . clopidogrel (PLAVIX) 75 MG tablet TAKE 1 TABLET BY MOUTH EVERY DAY  . ELIQUIS 5 MG TABS tablet TAKE 1 TABLET BY MOUTH TWICE A DAY  . lenalidomide (REVLIMID) 10 MG capsule TAKE 1 CAPSULE BY MOUTH DAILY FOR 7 DAYS ON FOLLOWED BY 7 DAYS OFF. THEN REPEAT CYCLE.  Marland Kitchen omeprazole (PRILOSEC) 20 MG capsule Take 20 mg by mouth daily.  . potassium chloride SA (KLOR-CON M20) 20 MEQ tablet Take 1 tablet (20 mEq total) by mouth 3 (three) times daily.  . simvastatin (ZOCOR) 20 MG tablet TAKE 1 TABLET BY MOUTH EVERYDAY AT BEDTIME  . furosemide (LASIX) 20 MG tablet Use sparingly as needed for edema. No more than one tablet by mouth per week. (Patient not taking: Reported on 04/27/2019)  . HYDROcodone-acetaminophen (NORCO/VICODIN) 5-325 MG tablet Take 1 tablet by mouth 2 (two) times daily as needed. for pain  . ipratropium-albuterol (DUONEB) 0.5-2.5 (3) MG/3ML SOLN USE 1 VIAL IN NEBULIZER EVERY 6 HOURS AS  NEEDED FOR WHEEZING/SHORTNESS OF BREATH (NOT COVERED)  . levocetirizine (XYZAL) 5 MG tablet Take 5 mg by mouth every evening.   . methocarbamol (ROBAXIN) 750 MG tablet TAKE 1 TO 2 TABLETS BY MOUTH 3 TIMES A DAY AS NEEDED FOR MUSCLE SPASMS  . mometasone (NASONEX) 50 MCG/ACT nasal spray SPRAY 1 SPRAY INTO EACH NOSTRIL TWICE A DAY  . nitroGLYCERIN (NITROSTAT) 0.4 MG SL tablet Place 1 tablet (0.4 mg total) under the tongue every 5 (five) minutes as needed for chest pain. (Patient not taking: Reported on 02/08/2019)  . ondansetron (ZOFRAN) 4 MG tablet TAKE 1 TABLET BY MOUTH EVERY 8 HOURS AS NEEDED FOR SEVERE NAUSEA  . [DISCONTINUED] citalopram (CELEXA) 40 MG tablet TAKE 1 TABLET (40 MG TOTAL) BY MOUTH DAILY. (Patient not taking: Reported on 02/08/2019)  . [DISCONTINUED] lidocaine (XYLOCAINE) 2 % solution    Facility-Administered Encounter Medications as of 04/27/2019  Medication  . acetaminophen (TYLENOL) tablet 650 mg  . [COMPLETED] cyanocobalamin ((VITAMIN B-12)) injection 1,000 mcg  . sodium chloride 0.9 % injection 10 mL    ALLERGIES:  Allergies  Allergen Reactions  . Morphine And Related Other (See Comments)    hallucinations  . Tape Rash    Paper tape is ok     PHYSICAL EXAM:  ECOG Performance status: 2  Vitals:   04/27/19 1300  BP: 136/72  Pulse: 75  Resp: 16  Temp: 98 F (36.7 C)  SpO2: 98%   Filed Weights   04/27/19 1300  Weight: 181 lb 7 oz (82.3 kg)    Physical Exam Constitutional:      Appearance: Normal appearance. He is obese.  HENT:     Head: Normocephalic.  Nose: Nose normal.     Mouth/Throat:     Mouth: Mucous membranes are moist.     Pharynx: Oropharynx is clear.  Eyes:     Extraocular Movements: Extraocular movements intact.     Conjunctiva/sclera: Conjunctivae normal.  Neck:     Musculoskeletal: Normal range of motion.  Cardiovascular:     Rate and Rhythm: Normal rate and regular rhythm.     Pulses: Normal pulses.     Heart sounds: Normal  heart sounds.  Pulmonary:     Effort: Pulmonary effort is normal.     Breath sounds: Normal breath sounds.  Abdominal:     General: Bowel sounds are normal.     Palpations: Abdomen is soft.  Musculoskeletal: Normal range of motion.  Skin:    General: Skin is warm and dry.  Neurological:     General: No focal deficit present.     Mental Status: He is alert and oriented to person, place, and time.  Psychiatric:        Mood and Affect: Mood normal.        Behavior: Behavior normal.        Thought Content: Thought content normal.        Judgment: Judgment normal.      LABORATORY DATA:  I have reviewed the labs as listed.  CBC    Component Value Date/Time   WBC 5.0 04/27/2019 1240   RBC 4.00 (L) 04/27/2019 1240   HGB 11.4 (L) 04/27/2019 1240   HCT 36.5 (L) 04/27/2019 1240   PLT 118 (L) 04/27/2019 1240   MCV 91.3 04/27/2019 1240   MCH 28.5 04/27/2019 1240   MCHC 31.2 04/27/2019 1240   RDW 14.7 04/27/2019 1240   LYMPHSABS 3.0 04/27/2019 1240   MONOABS 0.5 04/27/2019 1240   EOSABS 0.3 04/27/2019 1240   BASOSABS 0.1 04/27/2019 1240   CMP Latest Ref Rng & Units 04/27/2019 03/19/2019 02/05/2019  Glucose 70 - 99 mg/dL 86 132(H) 84  BUN 8 - 23 mg/dL 11 15 9   Creatinine 0.61 - 1.24 mg/dL 1.11 1.06 0.91  Sodium 135 - 145 mmol/L 140 140 139  Potassium 3.5 - 5.1 mmol/L 3.4(L) 3.7 3.9  Chloride 98 - 111 mmol/L 106 108 110  CO2 22 - 32 mmol/L 25 25 25   Calcium 8.9 - 10.3 mg/dL 9.0 8.7(L) 8.6(L)  Total Protein 6.5 - 8.1 g/dL 7.4 7.1 6.9  Total Bilirubin 0.3 - 1.2 mg/dL 0.5 0.8 0.5  Alkaline Phos 38 - 126 U/L 72 66 69  AST 15 - 41 U/L 19 20 18   ALT 0 - 44 U/L 16 13 19        ASSESSMENT & PLAN:   Multiple myeloma in remission 1.  IgG lambda multiple myeloma: - Status post auto stem cell transplant at [place] on 08/24/2009. -On Revlimid maintenance 5 mg 7 days on 7 days off since 2011, well-tolerated except for occasional diarrhea. -Patient will continue Revlimid  maintenance, as he is continuing to be in remission. MM labs pending today.  - Follow-up in 3 months with repeat myeloma labs 1 week prior.  2.  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 -His last infection was on 03/11/2018 when he had community-acquired pneumonia. -He was recently changed to every 6 weeks for his IVIG infusions due to very few infections and IgG level normal. - He will continue getting IVIG every 6 weeks.   3.  Marginal zone lymphoma: - He underwent 6 cycles of  R-CHOP in 2007. - Upon physical examination he did not have any adenopathy or splenomegaly. - LDH : 120 today  4. 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 This is stable at this time.   5.  Vitamin B12 deficiency: - He is receiving monthly B12 injections. Vb12 remains low normal at 301. We will continue with Vb12 injections.    6.  Maintenance: - Last colonoscopy 07/16/2018, which showed a 12 mm polyp in the hepatic flexure.  Also found 3 polyps in the sigmoid and transverse colon.  All which were removed and came back benign.  He also had diverticulosis in the sigmoid colon.      Orders placed this encounter:  Orders Placed This Encounter  Procedures  . CBC with Differential  . Comprehensive metabolic panel  . Lactate dehydrogenase  . Multiple Myeloma Panel (SPEP&IFE w/QIG)  . Kappa/lambda light chains  . IgG, IgA, IgM      Roger Shelter, [place] [phone]

## 2019-04-28 LAB — PROTEIN ELECTROPHORESIS, SERUM
A/G Ratio: 1 (ref 0.7–1.7)
Albumin ELP: 3.3 g/dL (ref 2.9–4.4)
Alpha-1-Globulin: 0.2 g/dL (ref 0.0–0.4)
Alpha-2-Globulin: 0.8 g/dL (ref 0.4–1.0)
Beta Globulin: 1.2 g/dL (ref 0.7–1.3)
Gamma Globulin: 1.2 g/dL (ref 0.4–1.8)
Globulin, Total: 3.4 g/dL (ref 2.2–3.9)
Total Protein ELP: 6.7 g/dL (ref 6.0–8.5)

## 2019-04-28 LAB — IGG, IGA, IGM
IgA: 620 mg/dL — ABNORMAL HIGH (ref 61–437)
IgG (Immunoglobin G), Serum: 1280 mg/dL (ref 603–1613)
IgM (Immunoglobulin M), Srm: 38 mg/dL (ref 15–143)

## 2019-04-28 LAB — KAPPA/LAMBDA LIGHT CHAINS
Kappa free light chain: 108.8 mg/L — ABNORMAL HIGH (ref 3.3–19.4)
Kappa, lambda light chain ratio: 1.69 — ABNORMAL HIGH (ref 0.26–1.65)
Lambda free light chains: 64.3 mg/L — ABNORMAL HIGH (ref 5.7–26.3)

## 2019-04-28 LAB — VITAMIN D 25 HYDROXY (VIT D DEFICIENCY, FRACTURES): Vit D, 25-Hydroxy: 17.1 ng/mL — ABNORMAL LOW (ref 30.0–100.0)

## 2019-04-29 ENCOUNTER — Other Ambulatory Visit (HOSPITAL_COMMUNITY): Payer: Self-pay | Admitting: *Deleted

## 2019-04-29 DIAGNOSIS — C9001 Multiple myeloma in remission: Secondary | ICD-10-CM

## 2019-04-29 MED ORDER — LENALIDOMIDE 10 MG PO CAPS: ORAL_CAPSULE | ORAL | 0 refills | Status: DC

## 2019-05-03 ENCOUNTER — Ambulatory Visit (HOSPITAL_COMMUNITY): Payer: Medicare Other

## 2019-05-03 ENCOUNTER — Ambulatory Visit (HOSPITAL_COMMUNITY): Payer: Medicare Other | Admitting: Hematology

## 2019-05-04 ENCOUNTER — Other Ambulatory Visit: Payer: Self-pay

## 2019-05-04 ENCOUNTER — Inpatient Hospital Stay (HOSPITAL_COMMUNITY): Payer: Medicare Other

## 2019-05-04 VITALS — BP 128/61 | HR 62 | Temp 97.8°F | Resp 18 | Wt 184.8 lb

## 2019-05-04 DIAGNOSIS — R0782 Intercostal pain: Secondary | ICD-10-CM | POA: Diagnosis not present

## 2019-05-04 DIAGNOSIS — D801 Nonfamilial hypogammaglobulinemia: Secondary | ICD-10-CM

## 2019-05-04 DIAGNOSIS — E538 Deficiency of other specified B group vitamins: Secondary | ICD-10-CM | POA: Diagnosis not present

## 2019-05-04 DIAGNOSIS — G629 Polyneuropathy, unspecified: Secondary | ICD-10-CM | POA: Diagnosis not present

## 2019-05-04 DIAGNOSIS — C9001 Multiple myeloma in remission: Secondary | ICD-10-CM | POA: Diagnosis not present

## 2019-05-04 DIAGNOSIS — Z8673 Personal history of transient ischemic attack (TIA), and cerebral infarction without residual deficits: Secondary | ICD-10-CM | POA: Diagnosis not present

## 2019-05-04 DIAGNOSIS — K5730 Diverticulosis of large intestine without perforation or abscess without bleeding: Secondary | ICD-10-CM | POA: Diagnosis not present

## 2019-05-04 MED ORDER — HEPARIN SOD (PORK) LOCK FLUSH 100 UNIT/ML IV SOLN
500.0000 [IU] | Freq: Once | INTRAVENOUS | Status: AC | PRN
  Administered 2019-05-04: 500 [IU]

## 2019-05-04 MED ORDER — SODIUM CHLORIDE 0.9 % IV SOLN
Freq: Once | INTRAVENOUS | Status: AC
  Administered 2019-05-04: 09:00:00 via INTRAVENOUS
  Filled 2019-05-04: qty 4

## 2019-05-04 MED ORDER — SODIUM CHLORIDE 0.9% FLUSH
10.0000 mL | Freq: Once | INTRAVENOUS | Status: AC
  Administered 2019-05-04: 10 mL via INTRAVENOUS

## 2019-05-04 MED ORDER — ACETAMINOPHEN 325 MG PO TABS
650.0000 mg | ORAL_TABLET | Freq: Once | ORAL | Status: AC
  Administered 2019-05-04: 650 mg via ORAL
  Filled 2019-05-04: qty 2

## 2019-05-04 MED ORDER — DEXTROSE 5 % IV SOLN
INTRAVENOUS | Status: DC
  Administered 2019-05-04: 08:00:00 via INTRAVENOUS

## 2019-05-04 MED ORDER — SODIUM CHLORIDE 0.9 % IJ SOLN: 10.0000 mL | INTRAMUSCULAR | Status: DC | PRN

## 2019-05-04 MED ORDER — IMMUNE GLOBULIN (HUMAN) 10 GM/100ML IV SOLN
400.0000 mg/kg | Freq: Once | INTRAVENOUS | Status: AC
  Administered 2019-05-04: 35 g via INTRAVENOUS
  Filled 2019-05-04: qty 200

## 2019-05-04 NOTE — Patient Instructions (Signed)
[facility] _______________________________________________________________  Thank you for choosing [facility] to provide your oncology and hematology care.  To afford each patient quality time with our providers, please arrive at least 15 minutes before your scheduled appointment.  You need to re-schedule your appointment if you arrive 10 or more minutes late.  We strive to give you quality time with our providers, and arriving late affects you and other patients whose appointments are after yours.  Also, if you no show three or more times for appointments you may be dismissed from the clinic.  Again, thank you for choosing [facility]. Our hope is that these requests will allow you access to exceptional care and in a timely manner. _______________________________________________________________  If you have questions after your visit, please contact our office at [phone] between the hours of 8:30 a.m. and 5:00 p.m. Voicemails left after 4:30 p.m. will not be returned until the following business day. _______________________________________________________________  For prescription refill requests, have your pharmacy contact our office. _______________________________________________________________  Recommendations made by the consultant and any test results will be sent to your referring physician. _______________________________________________________________ 

## 2019-05-04 NOTE — Progress Notes (Signed)
4712 lab work reviewed from 6/16 and patient approved for IVIG today.  Jeremy Parrish tolerated IVIG without incident or complaint. VSS upon completion of treatment. Discharged self ambulatory in satisfactory condition.

## 2019-05-05 DIAGNOSIS — M25511 Pain in right shoulder: Secondary | ICD-10-CM | POA: Diagnosis not present

## 2019-05-05 DIAGNOSIS — M25512 Pain in left shoulder: Secondary | ICD-10-CM | POA: Diagnosis not present

## 2019-05-10 ENCOUNTER — Telehealth: Payer: Self-pay | Admitting: Cardiovascular Disease

## 2019-05-10 NOTE — Telephone Encounter (Signed)
Returned call to pt he states that he did a download yesterday and was wondering if he could have the results. I do not see that he was due for download until next week. Will check will check with monitors to see if they have anything and will CB pt.   Message sent to monitor staff

## 2019-05-10 NOTE — Telephone Encounter (Signed)
New Message  Patient calling in with concerns about his heart monitor and wants to speak with Dr. Loletha Grayer about it. Please give patient a call.

## 2019-05-10 NOTE — Telephone Encounter (Signed)
° ° °  Routed to NL Triage °

## 2019-05-11 ENCOUNTER — Telehealth: Payer: Self-pay

## 2019-05-11 NOTE — Telephone Encounter (Signed)
Spoke to pt regarding transmission from 6/26, informed him that there were no events or episodes. Confirmed upcoming appt w/ Dr. Sallyanne Kuster on 05/19/19 @ 0900. Pt had no further questions.

## 2019-05-18 DIAGNOSIS — W57XXXA Bitten or stung by nonvenomous insect and other nonvenomous arthropods, initial encounter: Secondary | ICD-10-CM | POA: Diagnosis not present

## 2019-05-18 DIAGNOSIS — R202 Paresthesia of skin: Secondary | ICD-10-CM | POA: Diagnosis not present

## 2019-05-18 DIAGNOSIS — M79601 Pain in right arm: Secondary | ICD-10-CM | POA: Diagnosis not present

## 2019-05-18 DIAGNOSIS — L2389 Allergic contact dermatitis due to other agents: Secondary | ICD-10-CM | POA: Diagnosis not present

## 2019-05-19 ENCOUNTER — Encounter: Payer: Medicare Other | Admitting: Cardiovascular Disease

## 2019-05-22 DIAGNOSIS — M25511 Pain in right shoulder: Secondary | ICD-10-CM | POA: Diagnosis not present

## 2019-05-24 ENCOUNTER — Telehealth: Payer: Self-pay | Admitting: *Deleted

## 2019-05-24 NOTE — Telephone Encounter (Signed)

## 2019-05-25 ENCOUNTER — Ambulatory Visit (HOSPITAL_COMMUNITY): Payer: Medicare Other

## 2019-05-25 ENCOUNTER — Ambulatory Visit (INDEPENDENT_AMBULATORY_CARE_PROVIDER_SITE_OTHER): Payer: Medicare Other | Admitting: Cardiovascular Disease

## 2019-05-25 ENCOUNTER — Encounter: Payer: Self-pay | Admitting: Cardiovascular Disease

## 2019-05-25 ENCOUNTER — Other Ambulatory Visit: Payer: Self-pay | Admitting: *Deleted

## 2019-05-25 ENCOUNTER — Other Ambulatory Visit: Payer: Self-pay

## 2019-05-25 VITALS — BP 132/74 | HR 87 | Temp 97.9°F | Ht 68.0 in | Wt 181.0 lb

## 2019-05-25 DIAGNOSIS — I480 Paroxysmal atrial fibrillation: Secondary | ICD-10-CM

## 2019-05-25 DIAGNOSIS — Z01818 Encounter for other preprocedural examination: Secondary | ICD-10-CM | POA: Diagnosis not present

## 2019-05-25 MED ORDER — DILTIAZEM HCL ER COATED BEADS 120 MG PO CP24: 120.0000 mg | ORAL_CAPSULE | Freq: Every day | ORAL | 6 refills | Status: DC

## 2019-05-25 MED ORDER — SODIUM CHLORIDE 0.9% FLUSH: 3.0000 mL | Freq: Two times a day (BID) | INTRAVENOUS | Status: DC

## 2019-05-25 MED ORDER — NITROGLYCERIN 0.4 MG SL SUBL: 0.4000 mg | SUBLINGUAL_TABLET | SUBLINGUAL | 4 refills | Status: DC | PRN

## 2019-05-25 NOTE — Progress Notes (Signed)
Patient ID: JEP DYAS, male   DOB: [birth year], 78 y.o.   MRN: [id number]    Cardiology Office Note    Date:  05/25/2019   ID:  Jeremy Parrish, DOB 24-Jul-1941, MRN [id number]  PCP:  Celene Squibb, MD  Cardiologist:  Quay Burow, M.D. Sanda Klein, MD   Chief Complaint  Patient presents with   Fatigue   Shortness of Breath   Chest Pain    History of Present Illness:  Jeremy Parrish is a 78 y.o. male who returns for pacemaker follow-up. 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   He had a stroke in Nov 6144, complicated by significant residual expressive aphasia. He was treated with clopidogrel for many years. No atrial fibrillation was detected on his pacemaker at the time of the stroke, but atrial fibrillation was subsequently detected in 2017 and oral anticoagulants were added to clopidogrel.   He woke up yesterday morning with vague chest discomfort for the first time in years.  He took a couple of sublingual nitroglycerin without relief.  He thinks they were outdated/expired.  He eventually took a "pain pill" and the symptoms resolved.  He has been feeling weak ever since.  Chronically, he has exertional dyspnea NYHA functional class III which he attributes to COPD.  He uses inhalers regularly.  His device is a Medtronic Revo MRI conditional device.  Pacemaker interrogation shows that he has been in atrial fibrillation since yesterday morning at about 7:30 AM.  Although the device describes multiple back-to-back episodes, it seems like the arrhythmic event has been uninterrupted since then, with brief periods of undersensing of the atrial 80s.  The average ventricular  rate and atrial fibrillation seems to be in the 80s-90s, with occasional episodes of rapid ventricular response to the 130s.  Otherwise he has normal pacemaker function.  Battery is 2.90 V (RRT 2.81 V).  When he is not in atrial fibrillation he has over 85% atrial paced rhythm and only paces the ventricle 1.3% of the time.  Lead parameters are normal (cannot check atrial threshold).  Activity has been pretty steady at about 3 hours a day for most of the year with a recent increase last week.  He has not had recent falls or injuries or bleeding complications and is compliant with both clopidogrel and Eliquis.  Continues to have problems with diarrhea, sometimes 6 times a day.  Always worse on the weeks that he takes Revlimid    Past Medical History:  Diagnosis Date   Anemia    Aortic aneurysm of unspecified site without mention of rupture    Arthritis    Bladder neck contracture    Cancer (HCC)    Cerebral atherosclerosis    Carotid Doppler, 02/16/2013 - Bilateral Proximal ICAs,demonstrate mild plaque w/o evidence of significant diameter reduction, dissection, or any other vascular abnormality   CHF (congestive heart failure) (HCC)    Complication of anesthesia    COPD (chronic obstructive pulmonary disease) (HCC)    Coronary artery disease    Depression    Esophageal reflux    Heart disease    Heart murmur    Hx of bladder cancer 10/07/2012   Hyperlipidemia    Hypertension    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    Past Surgical History:  Procedure Laterality Date   BLADDER SURGERY     BONE MARROW TRANSPLANT  2011   COLON SURGERY     colon resection   COLONOSCOPY N/A 01/01/2013   Procedure: COLONOSCOPY;  Surgeon: Rogene Houston, MD;  [facility];  Service: Endoscopy;  Laterality: N/A;  825-moved to [place] notified pt   COLONOSCOPY N/A 07/16/2018   Procedure: COLONOSCOPY;  Surgeon: Rogene Houston, MD;  Location: [facility];  Service: Endoscopy;  Laterality: N/A;  1:25   CORONARY ANGIOPLASTY  06/24/2000   PCI and stenting in mid & proximal RCA   heart stents x 5  1999   INGUINAL HERNIA REPAIR Right 05/04/2014   Procedure: OPEN RIGHT INGUINAL HERNIA REPAIR with mesh;  Surgeon: Edward Jolly, MD;  Location: [facility] ORS;  Service: General;  Laterality: Right;   INSERT / REPLACE / REMOVE PACEMAKER     left ear skin cancer removed     NM MYOCAR PERF WALL MOTION  11/27/2007   inferior scar   PACEMAKER INSERTION  07/22/2011   Medtronic   POLYPECTOMY  07/16/2018   Procedure: POLYPECTOMY;  Surgeon: Rogene Houston, MD;  [facility]: [facility];  Service: Endoscopy;;  colon   PORTACATH PLACEMENT  07/26/2009   right chest   PROSTATE SURGERY     Rotator     ROTATOR CUFF REPAIR Right    SHOULDER ARTHROSCOPY WITH SUBACROMIAL DECOMPRESSION Right 07/21/2013   Procedure: RIGHT SHOULDER ARTHROSCOPY WITH SUBACROMIAL DECOMPRESSION AND DEBRIDEMENT & Injection of Left Shoulder;  Surgeon: Alta Corning, MD;  Location: [place];  Service: Orthopedics;  Laterality: Right;   TEE WITHOUT CARDIOVERSION  10/13/2012   Procedure: TRANSESOPHAGEAL ECHOCARDIOGRAM (TEE);  Surgeon: Sanda Klein, MD;  Location: [facility] ENDOSCOPY;  Service: Cardiovascular;  Laterality: N/A;  pat/kay/echo notified   us ECHOCARDIOGRAPHY  06/19/2011   RV mildly dilated,mild to mod.  MR,mild AI,mild PI   WRIST SURGERY     right    Outpatient Medications Prior to Visit  Medication Sig Dispense Refill   albuterol (PROVENTIL HFA;VENTOLIN HFA) 108 (90 Base) MCG/ACT inhaler Inhale 2 puffs into the lungs every 6 (six) hours as needed for wheezing or shortness of breath. 1 Inhaler 3   albuterol (PROVENTIL) (2.5 MG/3ML) 0.083% nebulizer solution Use via neb q 4 hours prn wheezing     amLODipine (NORVASC) 10 MG tablet TAKE 1 TABLET BY MOUTH EVERY DAY IN THE MORNING 90 tablet 0   clopidogrel (PLAVIX) 75 MG tablet TAKE 1 TABLET BY MOUTH EVERY DAY 90 tablet 2   ELIQUIS 5 MG TABS tablet TAKE 1 TABLET BY MOUTH TWICE A DAY 180 tablet 1   furosemide (LASIX) 20 MG tablet Use sparingly as needed for edema. No more than  one tablet by mouth per week. 21 tablet 0   HYDROcodone-acetaminophen (NORCO/VICODIN) 5-325 MG tablet Take 1 tablet by mouth 2 (two) times daily as needed. for pain  0   ipratropium-albuterol (DUONEB) 0.5-2.5 (3) MG/3ML SOLN USE 1 VIAL IN NEBULIZER EVERY 6 HOURS AS NEEDED FOR WHEEZING/SHORTNESS OF BREATH (NOT COVERED)     lenalidomide (REVLIMID) 10 MG capsule TAKE 1 CAPSULE BY MOUTH DAILY FOR 7 DAYS ON FOLLOWED BY 7 DAYS OFF. THEN REPEAT CYCLE. 14 capsule 0   levocetirizine (XYZAL) 5 MG tablet Take 5 mg by mouth every evening.      methocarbamol (ROBAXIN) 750 MG tablet TAKE 1 TO 2 TABLETS BY MOUTH 3 TIMES A DAY AS NEEDED FOR MUSCLE SPASMS     mometasone (NASONEX) 50 MCG/ACT nasal spray SPRAY 1 SPRAY INTO EACH NOSTRIL TWICE A DAY  1   omeprazole (PRILOSEC) 20 MG capsule Take 20 mg by mouth daily.     ondansetron (ZOFRAN) 4 MG tablet TAKE 1 TABLET BY MOUTH EVERY 8 HOURS AS NEEDED FOR SEVERE NAUSEA  1   potassium chloride SA (KLOR-CON M20) 20 MEQ tablet Take 1 tablet (20 mEq total) by mouth 3 (three) times daily. 270 tablet 3   simvastatin (ZOCOR) 20 MG tablet TAKE 1 TABLET BY MOUTH EVERYDAY AT BEDTIME 90 tablet 1   nitroGLYCERIN (NITROSTAT) 0.4 MG SL tablet  Place 1 tablet (0.4 mg total) under the tongue every 5 (five) minutes as needed for chest pain. 25 tablet 4   Facility-Administered Medications Prior to Visit  Medication Dose Route Frequency Provider Last Rate Last Dose   acetaminophen (TYLENOL) tablet 650 mg  650 mg Oral Q6H PRN Baird Cancer, PA-C   650 mg at 12/09/14 9417   sodium chloride 0.9 % injection 10 mL  10 mL Intracatheter PRN Baird Cancer, PA-C   10 mL at 12/09/14 4081     Allergies:   Morphine and related and Tape   Social History   Socioeconomic History   Marital status: Married    Spouse name: [name]   Number of children: 3   Years of education: 9th   Highest education level: Not on file  Occupational History   Occupation: retired   Scientist, product/process development strain: Not on file   Food insecurity    Worry: Not on file    Inability: Not on Lexicographer needs    Medical: Not on file    Non-medical: Not on file  Tobacco Use   Smoking status: Former Smoker    Packs/day: 1.00    Years: 20.00    Pack years: 20.00    Types: Cigarettes    Quit date: 11/14/1994    Years since quitting: 24.5   Smokeless tobacco: Never Used  Substance and Sexual Activity   Alcohol use: No    Alcohol/week: 0.0 standard drinks    Comment: previously drank but none for at least 15 years.   Drug use: No   Sexual activity: Not on file  Lifestyle   Physical activity    Days per week: Not on file    Minutes per session: Not on file   Stress: Not on file  Relationships   Social connections    Talks on phone: Not on file    Gets together: Not on file    Attends religious service: Not on file    Active member of club or organization: Not on file    Attends meetings of clubs  or organizations: Not on file    Relationship status: Not on file  Other Topics Concern   Not on file  Social History Narrative   Patient lives at home spouse.   Caffeine Use: Occasionally     Family History:   The patient's family history includes Cancer in his brother and father; Heart attack in his sister; Heart disease in his brother, father, and sister; Hyperlipidemia in his sister; Hypertension in his mother and sister.   ROS:   Please see the history of present illness.    ROS All other systems reviewed and are negative.   PHYSICAL EXAM:   VS:  BP 132/74    Pulse 87    Temp 97.9 F (36.6 C) (Temporal)    Ht _0  (1.727 m)    Wt 181 lb (82.1 kg)    SpO2 97%    BMI 27.52 kg/m     General: Alert, oriented x3, no distress, mildly obese Head: no evidence of trauma, PERRL, EOMI, no exophtalmos or lid lag, no myxedema, no xanthelasma; normal ears, nose and oropharynx Neck: normal jugular venous pulsations and no hepatojugular reflux; brisk carotid pulses without delay and no carotid bruits Chest: clear to auscultation but with diminished breath sounds throughout, no signs of consolidation by percussion or palpation, normal fremitus, symmetrical and full respiratory excursions Cardiovascular: normal position and quality of the apical impulse, irregular rhythm, normal first and second heart sounds, no murmurs, rubs or gallops, healthy left subclavian pacemaker site Abdomen: no tenderness or distention, no masses by palpation, no abnormal pulsatility or arterial bruits, normal bowel sounds, no hepatosplenomegaly Extremities: no clubbing, cyanosis or edema; 2+ radial, ulnar and brachial pulses bilaterally; 2+ right femoral, posterior tibial and dorsalis pedis pulses; 2+ left femoral, posterior tibial and dorsalis pedis pulses; no subclavian or femoral bruits Neurological: grossly nonfocal except for expressive a aphasia, which worsens if he becomes emotional Psych: Normal mood and affect   Wt Readings from Last 3 Encounters:  05/25/19 181 lb (82.1 kg)  05/04/19 184 lb 12.8 oz (83.8 kg)  04/27/19 181 lb 7 oz (82.3 kg)      Studies/Labs Reviewed:   EKG:  EKG is ordered today.  Shows atrial paced  ventricular sensed rhythm with a long AV delay of 222 ms, right bundle branch block, left anterior fascicular block, QTC 460 ms without ischemic repolarization abnormalities  Recent Labs: 04/27/2019: ALT 16; BUN 11; Creatinine, Ser 1.11; Hemoglobin 11.4; Platelets 118; Potassium 3.4; Sodium 140   Lipid Panel    Component Value Date/Time   CHOL 112 01/08/2019 1057   TRIG 118 01/08/2019 1057   HDL 28 (L) 01/08/2019 1057   CHOLHDL 4.0 01/08/2019 1057   VLDL 24 01/08/2019 1057   LDLCALC 60 01/08/2019 1057     ASSESSMENT:    1. Paroxysmal atrial fibrillation (HCC)   2. Pre-op testing      PLAN:  In order of problems listed above:  1. PAF: Previous episodes have been asymptomatic, possibly because they were so brief.  He now has weakness and appears to have had some brief angina pectoris.  The episode of atrial fibrillation has been sustained for about 36 hours and may be persistent.  We will tentatively schedule him for cardioversion next week.  We will ask him to do a pacemaker download Sunday night to make sure he is still in atrial fibrillation, then perform the necessary coronavirus testing and other labs on Monday, with plans for cardioversion Wednesday, July 22 at  12:15 PM.  He has been compliant with anticoagulation without interruption.. CHADSVasc score 6 (age, CAD, LV dysfunction, HTN, previous CVA). On Eliquis and clopidogrel. Specific antiarrhythmic therapy does not appear to be indicated at this point, but will have to be considered if he has early recurrence of atrial fibrillation. 2. SSS: Underlying rhythm is sinus bradycardia usually; heart rate histograms show that his sensor settings are appropriate. 3. PPM: We will recheck a pacemaker download about a month after his cardioversion.  Otherwise continue with remote downloads every 3 months thereafter and yearly office visit. 4. Eliquis: Fewer falls than in the past.  No recent serious injuries. 5. CAD: Symptom onset appears  to coincide with atrial fibrillation, possibly due to RVR.  If he still has chest discomfort after cardioversion we will need to initiate ischemic work-up.  Start diltiazem sustained release 120 mg once daily.  Unlikely to tolerate beta-blockers due to COPD.  We will give him a fresh prescription for sublingual nitroglycerin. 6. COPD: Functional class III dyspnea. 7. S/P CVA:  residual expressive aphasia, but today he was able to communicate very well. 8. HLP: LDL at target <70 on labs from February, but HDL remains severely decreased. 9. HTN: Well-controlled  The cardioversion with sedation procedure has been fully reviewed with the patient and his wife written informed consent has been obtained.    Medication Adjustments/Labs and Tests Ordered: Current medicines are reviewed at length with the patient today.  Concerns regarding medicines are outlined above.  Medication changes, Labs and Tests ordered today are listed in the Patient Instructions below. Patient Instructions  Medication Instructions:  START Diltiazem 120 mg once daily  If you need a refill on your cardiac medications before your next appointment, please call your pharmacy.   Testing/Procedures:  Please do a download Sunday night (05/30/2019)  You are scheduled for a  Cardioversion on 06/02/2019 with Dr. Sallyanne Kuster.  Please arrive at the [facility] (Main Entrance A) at [facility]: [address] [place], [place] [address] at 12:15 pm. (1 hour prior to procedure)  DIET: Nothing to eat or drink after midnight except a sip of water with medications (see medication instructions below)  Medication Instructions: Hold Furosemide the morning of the procedure.  Continue your anticoagulant: Eliquis You will need to continue your anticoagulant after your procedure until you  are told by your provider that it is safe to stop   Labs: Your provider would like for you to return on Monday 05/31/2019 to have the following labs  drawn: CBC and BMET. You do not need an appointment for the lab. Once in our office lobby there is a podium where you can sign in and ring the doorbell to alert [place] that you are here. The lab is open from 8:00 am to 4:30 pm; closed for lunch from 12:45pm-1:45pm.  You will need to have the coronavirus test completed prior to your procedure. An appointment has been made at 11:50 on 05/31/2019. This will be completed at [place]. This is a drive thru test only. Please make sure to have all other labs completed before this test because you will need to stay quarantined until your procedure.  You must have a responsible person to drive you home and stay in the waiting area during your procedure. Failure to do so could result in cancellation.  Bring your insurance cards.  *Special Note: Every effort is made to have your procedure done on time. Occasionally there are emergencies that  occur at the hospital that may cause delays. Please be patient if a delay does occur.    Follow-Up: At [facility], you and your health needs are our priority.  As part of our continuing mission to provide you with exceptional heart care, we have created designated Provider Care Teams.  These Care Teams include your primary Cardiologist (physician) and Advanced Practice Providers (APPs -  Physician Assistants and Nurse Practitioners) who all work together to provide you with the care you need, when you need it. You will need a follow up appointment in 6 months.  Please call our office 2 months in advance to schedule this appointment.  You may see Sanda Klein, MD or one of the following Advanced Practice Providers on your designated Care Team: Almyra Deforest, PA-C Fabian Sharp, [place]           Signed, Sanda Klein, MD  05/25/2019 1:53 PM    [facility] [place], [place], [place]  [address] Phone: [phone]; Fax: [phone]

## 2019-05-25 NOTE — Patient Instructions (Addendum)
Medication Instructions:  START Diltiazem 120 mg once daily  If you need a refill on your cardiac medications before your next appointment, please call your pharmacy.   Testing/Procedures:  Please do a download Sunday night (05/30/2019)  You are scheduled for a  Cardioversion on 06/02/2019 with Dr. Sallyanne Kuster.  Please arrive at the [facility] (Main Entrance A) at [facility]: [address] [place], [place] [address] at 11:15 pm. (1 hour prior to procedure)  DIET: Nothing to eat or drink after midnight except a sip of water with medications (see medication instructions below)  Medication Instructions: Hold Furosemide the morning of the procedure.  Continue your anticoagulant: Eliquis You will need to continue your anticoagulant after your procedure until you  are told by your provider that it is safe to stop   Labs: Your provider would like for you to return on Monday 05/31/2019 to have the following labs drawn: CBC and BMET. You do not need an appointment for the lab. Once in our office lobby there is a podium where you can sign in and ring the doorbell to alert [place] that you are here. The lab is open from 8:00 am to 4:30 pm; closed for lunch from 12:45pm-1:45pm.  You will need to have the coronavirus test completed prior to your procedure. An appointment has been made at 11:50 on 05/31/2019. This will be completed at [place]. This is a drive thru test only. Please make sure to have all other labs completed before this test because you will need to stay quarantined until your procedure.  You must have a responsible person to drive you home and stay in the waiting area during your procedure. Failure to do so could result in cancellation.  Bring your insurance cards.  *Special Note: Every effort is made to have your procedure done on time. Occasionally there are emergencies that occur at the hospital that may cause delays. Please be patient if a delay does  occur.    Follow-Up: At [facility], you and your health needs are our priority.  As part of our continuing mission to provide you with exceptional heart care, we have created designated Provider Care Teams.  These Care Teams include your primary Cardiologist (physician) and Advanced Practice Providers (APPs -  Physician Assistants and Nurse Practitioners) who all work together to provide you with the care you need, when you need it. You will need a follow up appointment in 6 months.  Please call our office 2 months in advance to schedule this appointment.  You may see Sanda Klein, MD or one of the following Advanced Practice Providers on your designated Care Team: Almyra Deforest, PA-C Fabian Sharp, [place]

## 2019-05-25 NOTE — H&P (View-Only) (Signed)
Patient ID: Jeremy Parrish, male   DOB: January 03, 1941, 79 y.o.   MRN: [id number]    Cardiology Office Note    Date:  05/25/2019   ID:  Jeremy Parrish, DOB 1941-05-10, MRN [id number]  PCP:  Celene Squibb, MD  Cardiologist:  Quay Burow, M.D. Sanda Klein, MD   Chief Complaint  Patient presents with   Fatigue   Shortness of Breath   Chest Pain    History of Present Illness:  Jeremy Parrish is a 78 y.o. male who returns for pacemaker follow-up. 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   He had a stroke in Nov 4403, complicated by significant residual expressive aphasia. He was treated with clopidogrel for many years. No atrial fibrillation was detected on his pacemaker at the time of the stroke, but atrial fibrillation was subsequently detected in 2017 and oral anticoagulants were added to clopidogrel.   He woke up yesterday morning with vague chest discomfort for the first time in years.  He took a couple of sublingual nitroglycerin without relief.  He thinks they were outdated/expired.  He eventually took a "pain pill" and the symptoms resolved.  He has been feeling weak ever since.  Chronically, he has exertional dyspnea NYHA functional class III which he attributes to COPD.  He uses inhalers regularly.  His device is a Medtronic Revo MRI conditional device.  Pacemaker interrogation shows that he has been in atrial fibrillation since yesterday morning at about 7:30 AM.  Although the device describes multiple back-to-back episodes, it seems like the arrhythmic event has been uninterrupted since then, with brief periods of undersensing of the atrial 80s.  The average ventricular  rate and atrial fibrillation seems to be in the 80s-90s, with occasional episodes of rapid ventricular response to the 130s.  Otherwise he has normal pacemaker function.  Battery is 2.90 V (RRT 2.81 V).  When he is not in atrial fibrillation he has over 85% atrial paced rhythm and only paces the ventricle 1.3% of the time.  Lead parameters are normal (cannot check atrial threshold).  Activity has been pretty steady at about 3 hours a day for most of the year with a recent increase last week.  He has not had recent falls or injuries or bleeding complications and is compliant with both clopidogrel and Eliquis.  Continues to have problems with diarrhea, sometimes 6 times a day.  Always worse on the weeks that he takes Revlimid    Past Medical History:  Diagnosis Date   Anemia    Aortic aneurysm of unspecified site without mention of rupture    Arthritis    Bladder neck contracture    Cancer (HCC)    Cerebral atherosclerosis    Carotid Doppler, 02/16/2013 - Bilateral Proximal ICAs,demonstrate mild plaque w/o evidence of significant diameter reduction, dissection, or any other vascular abnormality   CHF (congestive heart failure) (HCC)    Complication of anesthesia    COPD (chronic obstructive pulmonary disease) (HCC)    Coronary artery disease    Depression    Esophageal reflux    Heart disease    Heart murmur    Hx of bladder cancer 10/07/2012   Hyperlipidemia    Hypertension    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    Past Surgical History:  Procedure Laterality Date   BLADDER SURGERY     BONE MARROW TRANSPLANT  2011   COLON SURGERY     colon resection   COLONOSCOPY N/A 01/01/2013   Procedure: COLONOSCOPY;  Surgeon: Rogene Houston, MD;  Location: [facility];  Service: Endoscopy;  Laterality: N/A;  825-moved to [place] notified pt   COLONOSCOPY N/A 07/16/2018   Procedure: COLONOSCOPY;  Surgeon: Rogene Houston, MD;  Location: [facility];  Service: Endoscopy;  Laterality: N/A;  1:25   CORONARY ANGIOPLASTY  06/24/2000   PCI and stenting in mid & proximal RCA   heart stents x 5  1999   INGUINAL HERNIA REPAIR Right 05/04/2014   Procedure: OPEN RIGHT INGUINAL HERNIA REPAIR with mesh;  Surgeon: Edward Jolly, MD;  Location: [facility] ORS;  Service: General;  Laterality: Right;   INSERT / REPLACE / REMOVE PACEMAKER     left ear skin cancer removed     NM MYOCAR PERF WALL MOTION  11/27/2007   inferior scar   PACEMAKER INSERTION  07/22/2011   Medtronic   POLYPECTOMY  07/16/2018   Procedure: POLYPECTOMY;  Surgeon: Rogene Houston, MD;  [facility]: [facility];  Service: Endoscopy;;  colon   PORTACATH PLACEMENT  07/26/2009   right chest   PROSTATE SURGERY     Rotator     ROTATOR CUFF REPAIR Right    SHOULDER ARTHROSCOPY WITH SUBACROMIAL DECOMPRESSION Right 07/21/2013   Procedure: RIGHT SHOULDER ARTHROSCOPY WITH SUBACROMIAL DECOMPRESSION AND DEBRIDEMENT & Injection of Left Shoulder;  Surgeon: Alta Corning, MD;  Location: [place];  Service: Orthopedics;  Laterality: Right;   TEE WITHOUT CARDIOVERSION  10/13/2012   Procedure: TRANSESOPHAGEAL ECHOCARDIOGRAM (TEE);  Surgeon: Sanda Klein, MD;  Location: [facility] ENDOSCOPY;  Service: Cardiovascular;  Laterality: N/A;  pat/kay/echo notified   us ECHOCARDIOGRAPHY  06/19/2011   RV mildly dilated,mild to mod.  MR,mild AI,mild PI   WRIST SURGERY     right    Outpatient Medications Prior to Visit  Medication Sig Dispense Refill   albuterol (PROVENTIL HFA;VENTOLIN HFA) 108 (90 Base) MCG/ACT inhaler Inhale 2 puffs into the lungs every 6 (six) hours as needed for wheezing or shortness of breath. 1 Inhaler 3   albuterol (PROVENTIL) (2.5 MG/3ML) 0.083% nebulizer solution Use via neb q 4 hours prn wheezing     amLODipine (NORVASC) 10 MG tablet TAKE 1 TABLET BY MOUTH EVERY DAY IN THE MORNING 90 tablet 0   clopidogrel (PLAVIX) 75 MG tablet TAKE 1 TABLET BY MOUTH EVERY DAY 90 tablet 2   ELIQUIS 5 MG TABS tablet TAKE 1 TABLET BY MOUTH TWICE A DAY 180 tablet 1   furosemide (LASIX) 20 MG tablet Use sparingly as needed for edema. No more than  one tablet by mouth per week. 21 tablet 0   HYDROcodone-acetaminophen (NORCO/VICODIN) 5-325 MG tablet Take 1 tablet by mouth 2 (two) times daily as needed. for pain  0   ipratropium-albuterol (DUONEB) 0.5-2.5 (3) MG/3ML SOLN USE 1 VIAL IN NEBULIZER EVERY 6 HOURS AS NEEDED FOR WHEEZING/SHORTNESS OF BREATH (NOT COVERED)     lenalidomide (REVLIMID) 10 MG capsule TAKE 1 CAPSULE BY MOUTH DAILY FOR 7 DAYS ON FOLLOWED BY 7 DAYS OFF. THEN REPEAT CYCLE. 14 capsule 0   levocetirizine (XYZAL) 5 MG tablet Take 5 mg by mouth every evening.      methocarbamol (ROBAXIN) 750 MG tablet TAKE 1 TO 2 TABLETS BY MOUTH 3 TIMES A DAY AS NEEDED FOR MUSCLE SPASMS     mometasone (NASONEX) 50 MCG/ACT nasal spray SPRAY 1 SPRAY INTO EACH NOSTRIL TWICE A DAY  1   omeprazole (PRILOSEC) 20 MG capsule Take 20 mg by mouth daily.     ondansetron (ZOFRAN) 4 MG tablet TAKE 1 TABLET BY MOUTH EVERY 8 HOURS AS NEEDED FOR SEVERE NAUSEA  1   potassium chloride SA (KLOR-CON M20) 20 MEQ tablet Take 1 tablet (20 mEq total) by mouth 3 (three) times daily. 270 tablet 3   simvastatin (ZOCOR) 20 MG tablet TAKE 1 TABLET BY MOUTH EVERYDAY AT BEDTIME 90 tablet 1   nitroGLYCERIN (NITROSTAT) 0.4 MG SL tablet  Place 1 tablet (0.4 mg total) under the tongue every 5 (five) minutes as needed for chest pain. 25 tablet 4   Facility-Administered Medications Prior to Visit  Medication Dose Route Frequency Provider Last Rate Last Dose   acetaminophen (TYLENOL) tablet 650 mg  650 mg Oral Q6H PRN Baird Cancer, PA-C   650 mg at 12/09/14 9417   sodium chloride 0.9 % injection 10 mL  10 mL Intracatheter PRN Baird Cancer, PA-C   10 mL at 12/09/14 4081     Allergies:   Morphine and related and Tape   Social History   Socioeconomic History   Marital status: Married    Spouse name: [name]   Number of children: 3   Years of education: 9th   Highest education level: Not on file  Occupational History   Occupation: retired   Scientist, product/process development strain: Not on file   Food insecurity    Worry: Not on file    Inability: Not on Lexicographer needs    Medical: Not on file    Non-medical: Not on file  Tobacco Use   Smoking status: Former Smoker    Packs/day: 1.00    Years: 20.00    Pack years: 20.00    Types: Cigarettes    Quit date: 11/14/1994    Years since quitting: 24.5   Smokeless tobacco: Never Used  Substance and Sexual Activity   Alcohol use: No    Alcohol/week: 0.0 standard drinks    Comment: previously drank but none for at least 15 years.   Drug use: No   Sexual activity: Not on file  Lifestyle   Physical activity    Days per week: Not on file    Minutes per session: Not on file   Stress: Not on file  Relationships   Social connections    Talks on phone: Not on file    Gets together: Not on file    Attends religious service: Not on file    Active member of club or organization: Not on file    Attends meetings of clubs  or organizations: Not on file    Relationship status: Not on file  Other Topics Concern   Not on file  Social History Narrative   Patient lives at home spouse.   Caffeine Use: Occasionally     Family History:   The patient's family history includes Cancer in his brother and father; Heart attack in his sister; Heart disease in his brother, father, and sister; Hyperlipidemia in his sister; Hypertension in his mother and sister.   ROS:   Please see the history of present illness.    ROS All other systems reviewed and are negative.   PHYSICAL EXAM:   VS:  BP 132/74    Pulse 87    Temp 97.9 F (36.6 C) (Temporal)    Ht _0  (1.727 m)    Wt 181 lb (82.1 kg)    SpO2 97%    BMI 27.52 kg/m     General: Alert, oriented x3, no distress, mildly obese Head: no evidence of trauma, PERRL, EOMI, no exophtalmos or lid lag, no myxedema, no xanthelasma; normal ears, nose and oropharynx Neck: normal jugular venous pulsations and no hepatojugular reflux; brisk carotid pulses without delay and no carotid bruits Chest: clear to auscultation but with diminished breath sounds throughout, no signs of consolidation by percussion or palpation, normal fremitus, symmetrical and full respiratory excursions Cardiovascular: normal position and quality of the apical impulse, irregular rhythm, normal first and second heart sounds, no murmurs, rubs or gallops, healthy left subclavian pacemaker site Abdomen: no tenderness or distention, no masses by palpation, no abnormal pulsatility or arterial bruits, normal bowel sounds, no hepatosplenomegaly Extremities: no clubbing, cyanosis or edema; 2+ radial, ulnar and brachial pulses bilaterally; 2+ right femoral, posterior tibial and dorsalis pedis pulses; 2+ left femoral, posterior tibial and dorsalis pedis pulses; no subclavian or femoral bruits Neurological: grossly nonfocal except for expressive a aphasia, which worsens if he becomes emotional Psych: Normal mood and affect   Wt Readings from Last 3 Encounters:  05/25/19 181 lb (82.1 kg)  05/04/19 184 lb 12.8 oz (83.8 kg)  04/27/19 181 lb 7 oz (82.3 kg)      Studies/Labs Reviewed:   EKG:  EKG is ordered today.  Shows atrial paced  ventricular sensed rhythm with a long AV delay of 222 ms, right bundle branch block, left anterior fascicular block, QTC 460 ms without ischemic repolarization abnormalities  Recent Labs: 04/27/2019: ALT 16; BUN 11; Creatinine, Ser 1.11; Hemoglobin 11.4; Platelets 118; Potassium 3.4; Sodium 140   Lipid Panel    Component Value Date/Time   CHOL 112 01/08/2019 1057   TRIG 118 01/08/2019 1057   HDL 28 (L) 01/08/2019 1057   CHOLHDL 4.0 01/08/2019 1057   VLDL 24 01/08/2019 1057   LDLCALC 60 01/08/2019 1057     ASSESSMENT:    1. Paroxysmal atrial fibrillation (HCC)   2. Pre-op testing      PLAN:  In order of problems listed above:  1. PAF: Previous episodes have been asymptomatic, possibly because they were so brief.  He now has weakness and appears to have had some brief angina pectoris.  The episode of atrial fibrillation has been sustained for about 36 hours and may be persistent.  We will tentatively schedule him for cardioversion next week.  We will ask him to do a pacemaker download Sunday night to make sure he is still in atrial fibrillation, then perform the necessary coronavirus testing and other labs on Monday, with plans for cardioversion Wednesday, July 22 at  12:15 PM.  He has been compliant with anticoagulation without interruption.. CHADSVasc score 6 (age, CAD, LV dysfunction, HTN, previous CVA). On Eliquis and clopidogrel. Specific antiarrhythmic therapy does not appear to be indicated at this point, but will have to be considered if he has early recurrence of atrial fibrillation. 2. SSS: Underlying rhythm is sinus bradycardia usually; heart rate histograms show that his sensor settings are appropriate. 3. PPM: We will recheck a pacemaker download about a month after his cardioversion.  Otherwise continue with remote downloads every 3 months thereafter and yearly office visit. 4. Eliquis: Fewer falls than in the past.  No recent serious injuries. 5. CAD: Symptom onset appears  to coincide with atrial fibrillation, possibly due to RVR.  If he still has chest discomfort after cardioversion we will need to initiate ischemic work-up.  Start diltiazem sustained release 120 mg once daily.  Unlikely to tolerate beta-blockers due to COPD.  We will give him a fresh prescription for sublingual nitroglycerin. 6. COPD: Functional class III dyspnea. 7. S/P CVA:  residual expressive aphasia, but today he was able to communicate very well. 8. HLP: LDL at target <70 on labs from February, but HDL remains severely decreased. 9. HTN: Well-controlled  The cardioversion with sedation procedure has been fully reviewed with the patient and his wife written informed consent has been obtained.    Medication Adjustments/Labs and Tests Ordered: Current medicines are reviewed at length with the patient today.  Concerns regarding medicines are outlined above.  Medication changes, Labs and Tests ordered today are listed in the Patient Instructions below. Patient Instructions  Medication Instructions:  START Diltiazem 120 mg once daily  If you need a refill on your cardiac medications before your next appointment, please call your pharmacy.   Testing/Procedures:  Please do a download Sunday night (05/30/2019)  You are scheduled for a  Cardioversion on 06/02/2019 with Dr. Sallyanne Kuster.  Please arrive at the [facility] (Main Entrance A) at [facility]: [address] [place], [place] [address] at 12:15 pm. (1 hour prior to procedure)  DIET: Nothing to eat or drink after midnight except a sip of water with medications (see medication instructions below)  Medication Instructions: Hold Furosemide the morning of the procedure.  Continue your anticoagulant: Eliquis You will need to continue your anticoagulant after your procedure until you  are told by your provider that it is safe to stop   Labs: Your provider would like for you to return on Monday 05/31/2019 to have the following labs  drawn: CBC and BMET. You do not need an appointment for the lab. Once in our office lobby there is a podium where you can sign in and ring the doorbell to alert [place] that you are here. The lab is open from 8:00 am to 4:30 pm; closed for lunch from 12:45pm-1:45pm.  You will need to have the coronavirus test completed prior to your procedure. An appointment has been made at 11:50 on 05/31/2019. This will be completed at [place]. This is a drive thru test only. Please make sure to have all other labs completed before this test because you will need to stay quarantined until your procedure.  You must have a responsible person to drive you home and stay in the waiting area during your procedure. Failure to do so could result in cancellation.  Bring your insurance cards.  *Special Note: Every effort is made to have your procedure done on time. Occasionally there are emergencies that  occur at the hospital that may cause delays. Please be patient if a delay does occur.    Follow-Up: At [facility], you and your health needs are our priority.  As part of our continuing mission to provide you with exceptional heart care, we have created designated Provider Care Teams.  These Care Teams include your primary Cardiologist (physician) and Advanced Practice Providers (APPs -  Physician Assistants and Nurse Practitioners) who all work together to provide you with the care you need, when you need it. You will need a follow up appointment in 6 months.  Please call our office 2 months in advance to schedule this appointment.  You may see Sanda Klein, MD or one of the following Advanced Practice Providers on your designated Care Team: Almyra Deforest, PA-C Fabian Sharp, [place]           Signed, Sanda Klein, MD  05/25/2019 1:53 PM    [address] [place], [place], [place]  [address] Phone: [phone]; Fax: [phone]

## 2019-05-26 ENCOUNTER — Other Ambulatory Visit: Payer: Self-pay | Admitting: Cardiovascular Disease

## 2019-05-26 DIAGNOSIS — E785 Hyperlipidemia, unspecified: Secondary | ICD-10-CM

## 2019-05-27 ENCOUNTER — Other Ambulatory Visit (HOSPITAL_COMMUNITY): Payer: Self-pay | Admitting: *Deleted

## 2019-05-27 DIAGNOSIS — C9001 Multiple myeloma in remission: Secondary | ICD-10-CM

## 2019-05-27 MED ORDER — LENALIDOMIDE 10 MG PO CAPS: ORAL_CAPSULE | ORAL | 0 refills | Status: DC

## 2019-05-31 ENCOUNTER — Other Ambulatory Visit (HOSPITAL_COMMUNITY)
Admission: RE | Admit: 2019-05-31 | Discharge: 2019-05-31 | Disposition: A | Discharge status: Home / Self Care | Payer: Medicare Other | Source: Ambulatory Visit | Attending: Cardiovascular Disease | Admitting: Cardiovascular Disease

## 2019-05-31 ENCOUNTER — Ambulatory Visit (HOSPITAL_COMMUNITY): Payer: Medicare Other

## 2019-05-31 DIAGNOSIS — Z1159 Encounter for screening for other viral diseases: Secondary | ICD-10-CM | POA: Diagnosis not present

## 2019-05-31 DIAGNOSIS — Z01818 Encounter for other preprocedural examination: Secondary | ICD-10-CM | POA: Diagnosis not present

## 2019-05-31 DIAGNOSIS — I480 Paroxysmal atrial fibrillation: Secondary | ICD-10-CM | POA: Diagnosis not present

## 2019-05-31 LAB — SARS CORONAVIRUS 2 (TAT 6-24 HRS): SARS Coronavirus 2: NEGATIVE

## 2019-05-31 LAB — CBC
Hematocrit: 34.7 % — ABNORMAL LOW (ref 37.5–51.0)
Hemoglobin: 11.2 g/dL — ABNORMAL LOW (ref 13.0–17.7)
MCH: 28.6 pg (ref 26.6–33.0)
MCHC: 32.3 g/dL (ref 31.5–35.7)
MCV: 89 fL (ref 79–97)
Platelets: 123 10*3/uL — ABNORMAL LOW (ref 150–450)
RBC: 3.92 x10E6/uL — ABNORMAL LOW (ref 4.14–5.80)
RDW: 15.3 % (ref 11.6–15.4)
WBC: 6 10*3/uL (ref 3.4–10.8)

## 2019-05-31 LAB — BASIC METABOLIC PANEL
BUN/Creatinine Ratio: 13 (ref 10–24)
BUN: 13 mg/dL (ref 8–27)
CO2: 21 mmol/L (ref 20–29)
Calcium: 9.1 mg/dL (ref 8.6–10.2)
Chloride: 106 mmol/L (ref 96–106)
Creatinine, Ser: 1.01 mg/dL (ref 0.76–1.27)
GFR calc Af Amer: 83 mL/min/{1.73_m2} (ref >59)
GFR calc non Af Amer: 72 mL/min/{1.73_m2} (ref >59)
Glucose: 85 mg/dL (ref 65–99)
Potassium: 4.6 mmol/L (ref 3.5–5.2)
Sodium: 140 mmol/L (ref 134–144)

## 2019-06-01 ENCOUNTER — Ambulatory Visit (HOSPITAL_COMMUNITY): Payer: Medicare Other

## 2019-06-01 NOTE — Progress Notes (Signed)
Attempted to call patient to discuss endo procedure for 7/22, he did not answer and he had an automated voicemail, so I did not leave message.

## 2019-06-02 ENCOUNTER — Ambulatory Visit (HOSPITAL_COMMUNITY): Payer: Medicare Other | Admitting: Certified Registered"

## 2019-06-02 ENCOUNTER — Ambulatory Visit (HOSPITAL_COMMUNITY)
Admission: RE | Admit: 2019-06-02 | Discharge: 2019-06-02 | Disposition: A | Discharge status: Home / Self Care | Payer: Medicare Other | Attending: Cardiovascular Disease | Admitting: Cardiovascular Disease

## 2019-06-02 ENCOUNTER — Encounter (HOSPITAL_COMMUNITY): Payer: Self-pay

## 2019-06-02 ENCOUNTER — Encounter (HOSPITAL_COMMUNITY)
Admission: RE | Disposition: A | Discharge status: Home / Self Care | Payer: Medicare Other | Source: Home / Self Care | Attending: Cardiovascular Disease

## 2019-06-02 ENCOUNTER — Other Ambulatory Visit: Payer: Self-pay

## 2019-06-02 ENCOUNTER — Other Ambulatory Visit: Payer: Self-pay | Admitting: Cardiovascular Disease

## 2019-06-02 DIAGNOSIS — F418 Other specified anxiety disorders: Secondary | ICD-10-CM | POA: Diagnosis not present

## 2019-06-02 DIAGNOSIS — I2510 Atherosclerotic heart disease of native coronary artery without angina pectoris: Secondary | ICD-10-CM | POA: Diagnosis not present

## 2019-06-02 DIAGNOSIS — I4891 Unspecified atrial fibrillation: Secondary | ICD-10-CM | POA: Diagnosis not present

## 2019-06-02 DIAGNOSIS — D801 Nonfamilial hypogammaglobulinemia: Secondary | ICD-10-CM | POA: Insufficient documentation

## 2019-06-02 DIAGNOSIS — Z7901 Long term (current) use of anticoagulants: Secondary | ICD-10-CM | POA: Insufficient documentation

## 2019-06-02 DIAGNOSIS — M1990 Unspecified osteoarthritis, unspecified site: Secondary | ICD-10-CM | POA: Insufficient documentation

## 2019-06-02 DIAGNOSIS — I252 Old myocardial infarction: Secondary | ICD-10-CM | POA: Diagnosis not present

## 2019-06-02 DIAGNOSIS — Z9481 Bone marrow transplant status: Secondary | ICD-10-CM | POA: Diagnosis not present

## 2019-06-02 DIAGNOSIS — I110 Hypertensive heart disease with heart failure: Secondary | ICD-10-CM | POA: Insufficient documentation

## 2019-06-02 DIAGNOSIS — Z79899 Other long term (current) drug therapy: Secondary | ICD-10-CM | POA: Diagnosis not present

## 2019-06-02 DIAGNOSIS — G4730 Sleep apnea, unspecified: Secondary | ICD-10-CM | POA: Insufficient documentation

## 2019-06-02 DIAGNOSIS — I739 Peripheral vascular disease, unspecified: Secondary | ICD-10-CM | POA: Insufficient documentation

## 2019-06-02 DIAGNOSIS — Z7902 Long term (current) use of antithrombotics/antiplatelets: Secondary | ICD-10-CM | POA: Insufficient documentation

## 2019-06-02 DIAGNOSIS — I719 Aortic aneurysm of unspecified site, without rupture: Secondary | ICD-10-CM | POA: Diagnosis not present

## 2019-06-02 DIAGNOSIS — Z955 Presence of coronary angioplasty implant and graft: Secondary | ICD-10-CM | POA: Insufficient documentation

## 2019-06-02 DIAGNOSIS — I480 Paroxysmal atrial fibrillation: Secondary | ICD-10-CM | POA: Diagnosis not present

## 2019-06-02 DIAGNOSIS — Z8249 Family history of ischemic heart disease and other diseases of the circulatory system: Secondary | ICD-10-CM | POA: Diagnosis not present

## 2019-06-02 DIAGNOSIS — Z885 Allergy status to narcotic agent status: Secondary | ICD-10-CM | POA: Insufficient documentation

## 2019-06-02 DIAGNOSIS — I495 Sick sinus syndrome: Secondary | ICD-10-CM | POA: Diagnosis not present

## 2019-06-02 DIAGNOSIS — E785 Hyperlipidemia, unspecified: Secondary | ICD-10-CM | POA: Insufficient documentation

## 2019-06-02 DIAGNOSIS — K219 Gastro-esophageal reflux disease without esophagitis: Secondary | ICD-10-CM | POA: Insufficient documentation

## 2019-06-02 DIAGNOSIS — Z950 Presence of cardiac pacemaker: Secondary | ICD-10-CM | POA: Insufficient documentation

## 2019-06-02 DIAGNOSIS — J449 Chronic obstructive pulmonary disease, unspecified: Secondary | ICD-10-CM | POA: Insufficient documentation

## 2019-06-02 DIAGNOSIS — I255 Ischemic cardiomyopathy: Secondary | ICD-10-CM | POA: Diagnosis not present

## 2019-06-02 DIAGNOSIS — Z87891 Personal history of nicotine dependence: Secondary | ICD-10-CM | POA: Insufficient documentation

## 2019-06-02 DIAGNOSIS — I4819 Other persistent atrial fibrillation: Secondary | ICD-10-CM | POA: Diagnosis not present

## 2019-06-02 DIAGNOSIS — I509 Heart failure, unspecified: Secondary | ICD-10-CM | POA: Diagnosis not present

## 2019-06-02 HISTORY — PX: CARDIOVERSION: SHX1299

## 2019-06-02 SURGERY — CARDIOVERSION
Anesthesia: General

## 2019-06-02 MED ORDER — SODIUM CHLORIDE 0.9% FLUSH: 3.0000 mL | INTRAVENOUS | Status: DC | PRN

## 2019-06-02 MED ORDER — SODIUM CHLORIDE 0.9 % IV SOLN
250.0000 mL | INTRAVENOUS | Status: DC
  Administered 2019-06-02: 250 mL via INTRAVENOUS

## 2019-06-02 MED ORDER — PROPOFOL 10 MG/ML IV BOLUS
INTRAVENOUS | Status: DC | PRN
  Administered 2019-06-02: 50 mg via INTRAVENOUS

## 2019-06-02 MED ORDER — LIDOCAINE 2% (20 MG/ML) 5 ML SYRINGE
INTRAMUSCULAR | Status: DC | PRN
  Administered 2019-06-02: 50 mg via INTRAVENOUS

## 2019-06-02 MED ORDER — SODIUM CHLORIDE 0.9 % IV SOLN
INTRAVENOUS | Status: DC | PRN
  Administered 2019-06-02: 12:00:00 via INTRAVENOUS

## 2019-06-02 NOTE — Interval H&P Note (Signed)
History and Physical Interval Note:  06/02/2019 12:06 PM  STEPHONE GUM  has presented today for surgery, with the diagnosis of afib.  The various methods of treatment have been discussed with the patient and family. After consideration of risks, benefits and other options for treatment, the patient has consented to  Procedure(s): CARDIOVERSION (N/A) as a surgical intervention.  The patient's history has been reviewed, patient examined, no change in status, stable for surgery.  I have reviewed the patient's chart and labs.  Questions were answered to the patient's satisfaction.     Jeremy Parrish

## 2019-06-02 NOTE — Transfer of Care (Signed)
Immediate Anesthesia Transfer of Care Note  Patient: Jeremy Parrish  Procedure(s) Performed: CARDIOVERSION (N/A )  Patient Location: Endoscopy Unit  Anesthesia Type:General  Level of Consciousness: drowsy  Airway & Oxygen Therapy: Patient Spontanous Breathing and Patient connected to face mask oxygen  Post-op Assessment: Report given to RN and Post -op Vital signs reviewed and stable  Post vital signs: Reviewed and stable  Last Vitals:  Vitals Value Taken Time  BP    Temp    Pulse    Resp    SpO2      Last Pain:  Vitals:   06/02/19 1128  TempSrc: Temporal  PainSc: 0-No pain         Complications: No apparent anesthesia complications

## 2019-06-02 NOTE — Anesthesia Procedure Notes (Signed)
Procedure Name: General with mask airway Date/Time: 06/02/2019 12:12 PM Performed by: Orlie Dakin, CRNA Pre-anesthesia Checklist: Patient identified, Suction available, Emergency Drugs available and Patient being monitored Patient Re-evaluated:Patient Re-evaluated prior to induction Oxygen Delivery Method: Ambu bag Preoxygenation: Pre-oxygenation with 100% oxygen Induction Type: IV induction

## 2019-06-02 NOTE — Anesthesia Preprocedure Evaluation (Addendum)
Anesthesia Evaluation  Patient identified by MRN, date of birth, ID band Patient awake    Reviewed: Allergy & Precautions, NPO status , Patient's Chart, lab work & pertinent test results  History of Anesthesia Complications (+) PONV and history of anesthetic complications  Airway Mallampati: I  TM Distance: >3 FB Neck ROM: Full    Dental no notable dental hx. (+) Teeth Intact, Dental Advisory Given   Pulmonary sleep apnea , COPD, former smoker,    Pulmonary exam normal breath sounds clear to auscultation       Cardiovascular hypertension, + CAD, + Past MI, + Peripheral Vascular Disease and +CHF  Normal cardiovascular exam+ dysrhythmias Atrial Fibrillation + pacemaker  Rhythm:Regular Rate:Normal  TTE 2020 EF 45%, mild AI, mild MR  Device check 03/2019 Not pacemaker dependent.  Battery status is good.  Lead measurements are stable.  Heart rate histogram is favorable.  No clinically significant episodes of high ventricular rate or atrial mode switch noted.    Neuro/Psych PSYCHIATRIC DISORDERS Anxiety Depression CVA (expressive aphasia), Residual Symptoms    GI/Hepatic Neg liver ROS, GERD  ,  Endo/Other  negative endocrine ROS  Renal/GU negative Renal ROS  negative genitourinary   Musculoskeletal  (+) Arthritis ,   Abdominal   Peds  Hematology  (+) Blood dyscrasia (on plavix and eliquis), ,   Anesthesia Other Findings   Reproductive/Obstetrics                           Anesthesia Physical Anesthesia Plan  ASA: III  Anesthesia Plan: General   Post-op Pain Management:    Induction: Intravenous  PONV Risk Score and Plan: 3 and Propofol infusion and Treatment may vary due to age or medical condition  Airway Management Planned: Natural Airway and Mask  Additional Equipment:   Intra-op Plan:   Post-operative Plan:   Informed Consent: I have reviewed the patients History and  Physical, chart, labs and discussed the procedure including the risks, benefits and alternatives for the proposed anesthesia with the patient or authorized representative who has indicated his/her understanding and acceptance.     Dental advisory given  Plan Discussed with: CRNA  Anesthesia Plan Comments:         Anesthesia Quick Evaluation

## 2019-06-02 NOTE — Discharge Instructions (Signed)

## 2019-06-02 NOTE — Op Note (Signed)
Procedure: Electrical Cardioversion Indications:  Atrial Fibrillation  Procedure Details:  Consent: Risks of procedure as well as the alternatives and risks of each were explained to the (patient/caregiver).  Consent for procedure obtained.  Time Out: Verified patient identification, verified procedure, site/side was marked, verified correct patient position, special equipment/implants available, medications/allergies/relevent history reviewed, required imaging and test results available.  Performed  Patient placed on cardiac monitor, pulse oximetry, supplemental oxygen as necessary.  Sedation given: propofol 50 mg LV, Dr. Lanetta Inch Pacer pads placed anterior and posterior chest.  Cardioverted 1 time(s).  Cardioversion with synchronized biphasic 120J shock.  Evaluation: Findings: Post procedure EKG shows: NSR Complications: None Patient did tolerate procedure well. Comprehensive pacemaker generator and lead testing after shock was normal.  Time Spent Directly with the Patient:  30 minutes   Samayah Novinger 06/02/2019, 12:33 PM

## 2019-06-03 ENCOUNTER — Encounter (HOSPITAL_COMMUNITY): Payer: Self-pay | Admitting: Cardiovascular Disease

## 2019-06-04 NOTE — Anesthesia Postprocedure Evaluation (Signed)
Anesthesia Post Note  Patient: Jeremy Parrish  Procedure(s) Performed: CARDIOVERSION (N/A )     Patient location during evaluation: Endoscopy Anesthesia Type: General Level of consciousness: awake and alert Pain management: pain level controlled Vital Signs Assessment: post-procedure vital signs reviewed and stable Respiratory status: spontaneous breathing, nonlabored ventilation, respiratory function stable and patient connected to nasal cannula oxygen Cardiovascular status: blood pressure returned to baseline and stable Postop Assessment: no apparent nausea or vomiting Anesthetic complications: no    Last Vitals:  Vitals:   06/02/19 1240 06/02/19 1245  BP: 137/75 134/73  Pulse: 83 86  Resp: (!) 26 16  Temp:    SpO2: 99% 100%    Last Pain:  Vitals:   06/02/19 1245  TempSrc:   PainSc: 0-No pain   Pain Goal:                   Chelsey L Woodrum

## 2019-06-09 DIAGNOSIS — H353131 Nonexudative age-related macular degeneration, bilateral, early dry stage: Secondary | ICD-10-CM | POA: Diagnosis not present

## 2019-06-14 ENCOUNTER — Inpatient Hospital Stay (HOSPITAL_COMMUNITY): Payer: Medicare Other | Attending: Hematology

## 2019-06-14 ENCOUNTER — Other Ambulatory Visit: Payer: Self-pay

## 2019-06-14 DIAGNOSIS — D801 Nonfamilial hypogammaglobulinemia: Secondary | ICD-10-CM | POA: Diagnosis not present

## 2019-06-14 DIAGNOSIS — M25512 Pain in left shoulder: Secondary | ICD-10-CM | POA: Diagnosis not present

## 2019-06-14 DIAGNOSIS — G4700 Insomnia, unspecified: Secondary | ICD-10-CM | POA: Diagnosis not present

## 2019-06-14 DIAGNOSIS — M25511 Pain in right shoulder: Secondary | ICD-10-CM | POA: Diagnosis not present

## 2019-06-14 DIAGNOSIS — E538 Deficiency of other specified B group vitamins: Secondary | ICD-10-CM | POA: Diagnosis not present

## 2019-06-14 DIAGNOSIS — R197 Diarrhea, unspecified: Secondary | ICD-10-CM | POA: Diagnosis not present

## 2019-06-14 DIAGNOSIS — C9001 Multiple myeloma in remission: Secondary | ICD-10-CM

## 2019-06-14 LAB — CBC WITH DIFFERENTIAL/PLATELET
Abs Immature Granulocytes: 0.02 10*3/uL (ref 0.00–0.07)
Basophils Absolute: 0 10*3/uL (ref 0.0–0.1)
Basophils Relative: 1 %
Eosinophils Absolute: 0.2 10*3/uL (ref 0.0–0.5)
Eosinophils Relative: 3 %
HCT: 37.6 % — ABNORMAL LOW (ref 39.0–52.0)
Hemoglobin: 11.4 g/dL — ABNORMAL LOW (ref 13.0–17.0)
Immature Granulocytes: 0 %
Lymphocytes Relative: 43 %
Lymphs Abs: 2.1 10*3/uL (ref 0.7–4.0)
MCH: 28.5 pg (ref 26.0–34.0)
MCHC: 30.3 g/dL (ref 30.0–36.0)
MCV: 94 fL (ref 80.0–100.0)
Monocytes Absolute: 0.6 10*3/uL (ref 0.1–1.0)
Monocytes Relative: 11 %
Neutro Abs: 2.1 10*3/uL (ref 1.7–7.7)
Neutrophils Relative %: 42 %
Platelets: 188 10*3/uL (ref 150–400)
RBC: 4 MIL/uL — ABNORMAL LOW (ref 4.22–5.81)
RDW: 16.3 % — ABNORMAL HIGH (ref 11.5–15.5)
WBC: 5 10*3/uL (ref 4.0–10.5)
nRBC: 0 % (ref 0.0–0.2)

## 2019-06-14 LAB — COMPREHENSIVE METABOLIC PANEL
ALT: 18 U/L (ref 0–44)
AST: 17 U/L (ref 15–41)
Albumin: 3.1 g/dL — ABNORMAL LOW (ref 3.5–5.0)
Alkaline Phosphatase: 69 U/L (ref 38–126)
Anion gap: 9 (ref 5–15)
BUN: 10 mg/dL (ref 8–23)
CO2: 24 mmol/L (ref 22–32)
Calcium: 9 mg/dL (ref 8.9–10.3)
Chloride: 109 mmol/L (ref 98–111)
Creatinine, Ser: 1.13 mg/dL (ref 0.61–1.24)
GFR calc Af Amer: >60 mL/min (ref >60)
GFR calc non Af Amer: >60 mL/min (ref >60)
Glucose, Bld: 96 mg/dL (ref 70–99)
Potassium: 4.6 mmol/L (ref 3.5–5.1)
Sodium: 142 mmol/L (ref 135–145)
Total Bilirubin: 0.7 mg/dL (ref 0.3–1.2)
Total Protein: 6.7 g/dL (ref 6.5–8.1)

## 2019-06-14 LAB — LACTATE DEHYDROGENASE: LDH: 145 U/L (ref 98–192)

## 2019-06-14 MED ORDER — OCTREOTIDE ACETATE 30 MG IM KIT
PACK | INTRAMUSCULAR | Status: AC
  Filled 2019-06-14: qty 1

## 2019-06-15 ENCOUNTER — Inpatient Hospital Stay (HOSPITAL_COMMUNITY): Payer: Medicare Other

## 2019-06-15 VITALS — BP 108/73 | HR 75 | Temp 97.7°F | Resp 18 | Wt 179.0 lb

## 2019-06-15 DIAGNOSIS — E538 Deficiency of other specified B group vitamins: Secondary | ICD-10-CM | POA: Diagnosis not present

## 2019-06-15 DIAGNOSIS — D801 Nonfamilial hypogammaglobulinemia: Secondary | ICD-10-CM | POA: Diagnosis not present

## 2019-06-15 LAB — MULTIPLE MYELOMA PANEL, SERUM
Albumin SerPl Elph-Mcnc: 3 g/dL (ref 2.9–4.4)
Albumin/Glob SerPl: 1 (ref 0.7–1.7)
Alpha 1: 0.3 g/dL (ref 0.0–0.4)
Alpha2 Glob SerPl Elph-Mcnc: 0.9 g/dL (ref 0.4–1.0)
B-Globulin SerPl Elph-Mcnc: 1.1 g/dL (ref 0.7–1.3)
Gamma Glob SerPl Elph-Mcnc: 0.8 g/dL (ref 0.4–1.8)
Globulin, Total: 3.1 g/dL (ref 2.2–3.9)
IgA: 539 mg/dL — ABNORMAL HIGH (ref 61–437)
IgG (Immunoglobin G), Serum: 1026 mg/dL (ref 603–1613)
IgM (Immunoglobulin M), Srm: 34 mg/dL (ref 15–143)
Total Protein ELP: 6.1 g/dL (ref 6.0–8.5)

## 2019-06-15 LAB — IGG, IGA, IGM
IgA: 516 mg/dL — ABNORMAL HIGH (ref 61–437)
IgG (Immunoglobin G), Serum: 981 mg/dL (ref 603–1613)
IgM (Immunoglobulin M), Srm: 36 mg/dL (ref 15–143)

## 2019-06-15 LAB — KAPPA/LAMBDA LIGHT CHAINS
Kappa free light chain: 80.2 mg/L — ABNORMAL HIGH (ref 3.3–19.4)
Kappa, lambda light chain ratio: 1.6 (ref 0.26–1.65)
Lambda free light chains: 50.2 mg/L — ABNORMAL HIGH (ref 5.7–26.3)

## 2019-06-15 MED ORDER — CYANOCOBALAMIN 1000 MCG/ML IJ SOLN
1000.0000 ug | Freq: Once | INTRAMUSCULAR | Status: AC
  Administered 2019-06-15: 1000 ug via INTRAMUSCULAR
  Filled 2019-06-15: qty 1

## 2019-06-15 MED ORDER — SODIUM CHLORIDE 0.9% FLUSH
10.0000 mL | Freq: Once | INTRAVENOUS | Status: AC
  Administered 2019-06-15: 10 mL via INTRAVENOUS

## 2019-06-15 MED ORDER — DEXTROSE 5 % IV SOLN
INTRAVENOUS | Status: DC
  Administered 2019-06-15: 09:00:00 via INTRAVENOUS

## 2019-06-15 MED ORDER — HEPARIN SOD (PORK) LOCK FLUSH 100 UNIT/ML IV SOLN
500.0000 [IU] | Freq: Once | INTRAVENOUS | Status: AC | PRN
  Administered 2019-06-15: 500 [IU]

## 2019-06-15 MED ORDER — SODIUM CHLORIDE 0.9 % IJ SOLN: 10.0000 mL | INTRAMUSCULAR | Status: DC | PRN

## 2019-06-15 MED ORDER — ACETAMINOPHEN 325 MG PO TABS
650.0000 mg | ORAL_TABLET | Freq: Once | ORAL | Status: AC
  Administered 2019-06-15: 650 mg via ORAL
  Filled 2019-06-15: qty 2

## 2019-06-15 MED ORDER — IMMUNE GLOBULIN (HUMAN) 10 GM/100ML IV SOLN
400.0000 mg/kg | Freq: Once | INTRAVENOUS | Status: AC
  Administered 2019-06-15: 30 g via INTRAVENOUS
  Filled 2019-06-15: qty 200

## 2019-06-15 MED ORDER — SODIUM CHLORIDE 0.9 % IV SOLN
Freq: Once | INTRAVENOUS | Status: AC
  Administered 2019-06-15: 09:00:00 via INTRAVENOUS
  Filled 2019-06-15: qty 4

## 2019-06-15 NOTE — Progress Notes (Signed)
Jeremy Parrish tolerated IVIG infusion without incident or complaint. VSS. Discharged self ambulatory in satisfactory condition.

## 2019-06-15 NOTE — Patient Instructions (Signed)
[facility] Discharge Instructions for Patients Receiving Chemotherapy   Beginning January 23rd 2017 lab work for the [facility] will be done in the  Main lab at [facility] on 1st floor. If you have a lab appointment with the [facility] please come in thru the  Main Entrance and check in at the main information desk   Today you received the following chemotherapy agents IVIG  To help prevent nausea and vomiting after your treatment, we encourage you to take your nausea medication   If you develop nausea and vomiting, or diarrhea that is not controlled by your medication, call the clinic.  The clinic phone number is [phone]. Office hours are Monday-Friday 8:30am-5:00pm.  BELOW ARE SYMPTOMS THAT SHOULD BE REPORTED IMMEDIATELY:  *FEVER GREATER THAN 101.0 F  *CHILLS WITH OR WITHOUT FEVER  NAUSEA AND VOMITING THAT IS NOT CONTROLLED WITH YOUR NAUSEA MEDICATION  *UNUSUAL SHORTNESS OF BREATH  *UNUSUAL BRUISING OR BLEEDING  TENDERNESS IN MOUTH AND THROAT WITH OR WITHOUT PRESENCE OF ULCERS  *URINARY PROBLEMS  *BOWEL PROBLEMS  UNUSUAL RASH Items with * indicate a potential emergency and should be followed up as soon as possible. If you have an emergency after office hours please contact your primary care physician or go to the nearest emergency department.  Please call the clinic during office hours if you have any questions or concerns.   You may also contact the Patient Navigator at [phone] should you have any questions or need assistance in obtaining follow up care.      Resources For Cancer Patients and their Caregivers ? American Cancer Society: Can assist with transportation, wigs, general needs, runs Look Good Feel Better.        878-412-0456 ? Cancer Care: Provides financial assistance, online support groups, medication/co-pay assistance.  1-800-813-HOPE 782-004-7533) ? [place] Assists [place] cancer  patients and their families through emotional , educational and financial support.  386-690-3834 ? [place] DSS Where to apply for food stamps, Medicaid and utility assistance. 534 344 8449 ? [facility]: Transportation to medical appointments. 250-320-9803 ? Social Security Administration: May apply for disability if have a Stage IV cancer. 709-251-2319 707-209-0615 ? [employer], Disability and Transit Services: Assists with nutrition, care and transit needs. [phone]

## 2019-06-19 DIAGNOSIS — M25531 Pain in right wrist: Secondary | ICD-10-CM | POA: Diagnosis not present

## 2019-06-19 DIAGNOSIS — M67911 Unspecified disorder of synovium and tendon, right shoulder: Secondary | ICD-10-CM | POA: Diagnosis not present

## 2019-06-22 ENCOUNTER — Other Ambulatory Visit: Payer: Self-pay

## 2019-06-22 ENCOUNTER — Encounter (INDEPENDENT_AMBULATORY_CARE_PROVIDER_SITE_OTHER): Payer: Self-pay | Admitting: Nurse Practitioner

## 2019-06-22 ENCOUNTER — Other Ambulatory Visit (INDEPENDENT_AMBULATORY_CARE_PROVIDER_SITE_OTHER): Payer: Self-pay | Admitting: *Deleted

## 2019-06-22 ENCOUNTER — Ambulatory Visit (HOSPITAL_COMMUNITY): Payer: Medicare Other

## 2019-06-22 ENCOUNTER — Ambulatory Visit (INDEPENDENT_AMBULATORY_CARE_PROVIDER_SITE_OTHER): Payer: Medicare Other | Admitting: Nurse Practitioner

## 2019-06-22 VITALS — BP 117/71 | HR 98 | Temp 98.1°F | Ht 67.0 in | Wt 179.8 lb

## 2019-06-22 DIAGNOSIS — R197 Diarrhea, unspecified: Secondary | ICD-10-CM

## 2019-06-22 DIAGNOSIS — R140 Abdominal distension (gaseous): Secondary | ICD-10-CM

## 2019-06-22 NOTE — Patient Instructions (Signed)
1. Complete the ordered stool test  2. Continue Loperamide 2mg  one tab by mouth twice daily  3. We discussed starting Sandostatin LAR Depot if ok by your oncologist (if your stool culture is negative)  4. Follow up in our office in 4 weeks, call our office if your diarrhea or abdominal bloat worsens

## 2019-06-22 NOTE — Progress Notes (Signed)
Subjective:    Patient ID: Jeremy Parrish, male    DOB: 09-18-1941, 78 y.o.   MRN: [id number]  HPI  Mr. 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  He presents today with complaints of chronic diarrhea for the past 3 years. His wife is present. His diarrhea has increased over the past 6 months with associated abdominal bloat. No abdominal pain. He is having 2 to 5 watery diarrhea episodes daily. No bloody diarrhea. No rectal bleeding or melena. No specific food triggers.He takes Imodium 1 tab 2 to 3 times daily for years. No recent antibiotics. His GERD is well controlled on Omeprazole 21m once daily.  History of multiple myeloma followed by oncologist, Dr. NMilinda Hirschfeld He is on Revlimid 582mx 7 days then off x 7 days since 2011. He receives IVIG every 6 months, last injection was 06/15/2019.  He last saw his cardiologist, Dr. MiDani Gobbleon 05/25/2019. He was started on Diltiazem 12081mnce daily at that time. S/P cardioversion 06/02/2019.  His most recent colonoscopy was 07/16/2018 which identified a 102m30mbular adenomatous polyp at the hepatic flexure, 3 small tubular adenomatous polyps at the sigmoid and transverse colon, diverticulosis to the proximal sigmoid colon, patent end to end colonic anastomosis and external hemorrhoids.  Past Medical History:  Diagnosis Date  . Anemia   . Aortic aneurysm of unspecified site without mention of rupture   . Arthritis   .  Bladder neck contracture   . Cancer (HCC)[place]. Cerebral atherosclerosis    Carotid Doppler, 02/16/2013 - Bilateral Proximal ICAs,demonstrate mild plaque w/o evidence of significant diameter reduction, dissection, or any other vascular abnormality  . CHF (congestive heart failure) (HCC)[place]. Complication of anesthesia   . COPD (chronic obstructive pulmonary disease) (HCC)[place]. Coronary artery disease   . Depression   . Esophageal reflux   . Heart disease   . Heart murmur   . Hx of bladder cancer 10/07/2012  . Hyperlipidemia   . Hypertension   . 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 Intestinovesical fistula   . Kidney stones    history  . Lung mass   . Multiple myeloma   [facility]  . Non Hodgkin's lymphoma (HCC)[place]. Paroxysmal atrial fibrillation (HCC)[place]21/2017  . Peripheral arterial disease (HCC)[place]. Personal history of other diseases of circulatory system   . PONV (postoperative nausea and vomiting)   . Prostate cancer (HCC)[place]00  . Shingles   . Shortness of breath   . Sleep apnea    05-02-14 cpap , not yet used- suggested settings 5  . [facility]   Speech.   Past Surgical History:  Procedure Laterality Date  . BLADDER SURGERY    . BONE MARROW TRANSPLANT  2011  . CARDIOVERSION N/A 06/02/2019   Procedure: CARDIOVERSION;  Surgeon: CroiSanda Klein;  Location: [facility] END[facility];  Service: Cardiovascular;  Laterality: N/A;  .  COLON SURGERY     colon resection  . COLONOSCOPY N/A 01/01/2013   Procedure: COLONOSCOPY;  Surgeon: Rogene Houston, MD;  Location: [facility] END[facility] SUITE;  Service: Endoscopy;  Laterality: N/A;  825-moved to [place] notified pt  . COLONOSCOPY N/A 07/16/2018   Procedure: COLONOSCOPY;  Surgeon: Rogene Houston, MD;  Location: [facility] END[facility] SU[facility];  Service: Endoscopy;  Laterality: N/A;  1:25  . CORONARY ANGIOPLASTY  06/24/2000   PCI and stenting in mid & proximal RCA  .  heart stents x 5  1999  . INGUINAL HERNIA REPAIR Right 05/04/2014   Procedure: OPEN RIGHT INGUINAL HERNIA REPAIR with mesh;  Surgeon: Edward Jolly, MD;  Location: [facility] ORS;  Service: General;  Laterality: Right;  . INSERT / REPLACE / REMOVE PACEMAKER    . left ear skin cancer removed    . NM MYOCAR PERF WALL MOTION  11/27/2007   inferior scar  . PACEMAKER INSERTION  07/22/2011   [employer]  . POLYPECTOMY  07/16/2018   Procedure: POLYPECTOMY;  Surgeon: Rogene Houston, MD;  Location: [facility];  Service: Endoscopy;;  colon  . PORTACATH PLACEMENT  07/26/2009   right chest  . PROSTATE SURGERY    . Rotator    . ROTATOR CUFF REPAIR Right   . SHOULDER ARTHROSCOPY WITH SUBACROMIAL DECOMPRESSION Right 07/21/2013   Procedure: RIGHT SHOULDER ARTHROSCOPY WITH SUBACROMIAL DECOMPRESSION AND DEBRIDEMENT & Injection of Left Shoulder;  Surgeon: Alta Corning, MD;  Location: [facility];  Service: Orthopedics;  Laterality: Right;  . TEE WITHOUT CARDIOVERSION  10/13/2012   Procedure: TRANSESOPHAGEAL ECHOCARDIOGRAM (TEE);  Surgeon: Sanda Klein, MD;  Location: [facility] ENDOSCOPY;  Service: Cardiovascular;  Laterality: N/A;  pat/kay/echo notified  . US ECHOCARDIOGRAPHY  06/19/2011   RV mildly dilated,mild to mod. MR,mild AI,mild PI  . WRIST SURGERY     right   Current Outpatient Medications on File Prior to Visit  Medication Sig Dispense Refill  . albuterol (PROVENTIL HFA;VENTOLIN HFA) 108 (90 Base) MCG/ACT inhaler Inhale 2 puffs into the lungs every 6 (six) hours as needed for wheezing or shortness of breath. 1 Inhaler 3  . albuterol (PROVENTIL) (2.5 MG/3ML) 0.083% nebulizer solution Take 2.5 mg by nebulization every 4 (four) hours as needed for wheezing or shortness of breath.     . ALPRAZolam (XANAX) 0.25 MG tablet     . amLODipine (NORVASC) 5 MG tablet Take 5 mg by mouth daily.    . clopidogrel (PLAVIX) 75 MG tablet TAKE 1 TABLET BY MOUTH EVERY DAY 90 tablet 2  . diltiazem (CARDIZEM CD) 120 MG 24 hr capsule  Take 1 capsule (120 mg total) by mouth daily. 30 capsule 6  . ELIQUIS 5 MG TABS tablet TAKE 1 TABLET BY MOUTH TWICE A DAY 180 tablet 1  . furosemide (LASIX) 20 MG tablet Use sparingly as needed for edema. No more than one tablet by mouth per week. (Patient taking differently: Take 20 mg by mouth daily. ) 21 tablet 0  . gabapentin (NEURONTIN) 100 MG capsule     . HYDROcodone-acetaminophen (NORCO) 7.5-325 MG tablet     . HYDROcodone-acetaminophen (NORCO/VICODIN) 5-325 MG tablet Take 1 tablet by mouth 2 (two) times daily as needed. for pain  0  . ipratropium-albuterol (DUONEB) 0.5-2.5 (3) MG/3ML SOLN Take 3 mLs by nebulization every 6 (six) hours as needed (shortness of breath).     Marland Kitchen lenalidomide (REVLIMID) 10 MG capsule TAKE 1 CAPSULE BY MOUTH DAILY FOR 7 DAYS ON FOLLOWED BY 7 DAYS  OFF. THEN REPEAT CYCLE. 14 capsule 0  . levocetirizine (XYZAL) 5 MG tablet Take 5 mg by mouth every evening.     . methocarbamol (ROBAXIN) 750 MG tablet Take 750 mg by mouth every 8 (eight) hours as needed for muscle spasms.     . mometasone (NASONEX) 50 MCG/ACT nasal spray Place 2 sprays into the nose daily as needed (allergies).   1  . nitroGLYCERIN (NITROSTAT) 0.4 MG SL tablet Place 1 tablet (0.4 mg total) under the tongue every 5 (five) minutes as needed for chest pain. 25 tablet 4  . omeprazole (PRILOSEC) 20 MG capsule Take 20 mg by mouth daily.    . ondansetron (ZOFRAN) 4 MG tablet Take 4 mg by mouth every 8 (eight) hours as needed for nausea or vomiting.   1  . potassium chloride SA (KLOR-CON M20) 20 MEQ tablet Take 1 tablet (20 mEq total) by mouth 3 (three) times daily. 270 tablet 3  . simvastatin (ZOCOR) 20 MG tablet TAKE 1 TABLET BY MOUTH EVERYDAY AT BEDTIME (Patient taking differently: Take 40 mg by mouth at bedtime. ) 90 tablet 1   Current Facility-Administered Medications on File Prior to Visit  Medication Dose Route Frequency Provider Last Rate Last Dose  . acetaminophen (TYLENOL) tablet 650 mg  650 mg Oral  Q6H PRN Baird Cancer, PA-C   650 mg at 12/09/14 0855  . sodium chloride 0.9 % injection 10 mL  10 mL Intracatheter PRN Baird Cancer, PA-C   10 mL at 12/09/14 0856  . sodium chloride flush (NS) 0.9 % injection 3 mL  3 mL Intravenous Q12H Croitoru, Mihai, MD        Allergies  Allergen Reactions  . Morphine And Related Other (See Comments)    hallucinations  . Tape Rash    Paper tape is ok       Chemistry      Component Value Date/Time   NA 142 06/14/2019 1207   NA 140 05/31/2019 1020   K 4.6 06/14/2019 1207   CL 109 06/14/2019 1207   CO2 24 06/14/2019 1207   BUN 10 06/14/2019 1207   BUN 13 05/31/2019 1020   CREATININE 1.13 06/14/2019 1207   CREATININE 0.80 11/07/2014 1034      Component Value Date/Time   CALCIUM 9.0 06/14/2019 1207   ALKPHOS 69 06/14/2019 1207   AST 17 06/14/2019 1207   ALT 18 06/14/2019 1207   BILITOT 0.7 06/14/2019 1207     CBC    Component Value Date/Time   WBC 5.0 06/14/2019 1207   RBC 4.00 (L) 06/14/2019 1207   HGB 11.4 (L) 06/14/2019 1207   HGB 11.2 (L) 05/31/2019 1020   HCT 37.6 (L) 06/14/2019 1207   HCT 34.7 (L) 05/31/2019 1020   PLT 188 06/14/2019 1207   PLT 123 (L) 05/31/2019 1020   MCV 94.0 06/14/2019 1207   MCV 89 05/31/2019 1020   MCH 28.5 06/14/2019 1207   MCHC 30.3 06/14/2019 1207   RDW 16.3 (H) 06/14/2019 1207   RDW 15.3 05/31/2019 1020   LYMPHSABS 2.1 06/14/2019 1207   MONOABS 0.6 06/14/2019 1207   EOSABS 0.2 06/14/2019 1207   BASOSABS 0.0 06/14/2019 1207      Review of Systems  See HPI, all other systems reviewed and are negative     Objective:   Physical Exam  BP 117/71   Pulse 98   Temp 98.1 F (36.7 C)   Ht _0  (1.702 m)   Wt 179  lb 12.8 oz (81.6 kg)   BMI 28.16 kg/m   General: 78 y.o. male with expressive aphasia but communicates effectively in NAD Eyes: Sclera nonicteric, conjunctiva pink Mouth: Dentition intact, mucosa is dry Neck: Supple, no lymphadenopathy or thyromegaly Heart: Regular  rate and rhythm, no murmur Lungs: Clear throughout. Abdomen: Soft, nontender, no masses or organomegaly. Abdominal scar intact. Extremities: No edema. Neuro: Alert and oriented x4, moves all extremities, expressive aphasia secondary to past stroke    Assessment & Plan:  59.  79 year old male with multiple myeloma and hypergammaglobulinemia on Revlimid and IVIG presents with chronic diarrhea which has worsened over the past 6 months. I suspect his diarrhea is secondary to Revlimid, his prior small bowel surgery secondary to having an intestinovesicular fistula is most likely contributing to his chronic diarrhea. Colonoscopy 2019 did not show any evidence of colitis, he did have TA polyps.  -check GI panel including C.diff PCR to rule out infectious etiology  -discussed potential trial with Sandostatin LAR Depot if GI panel negative, I will discuss this treatment further with Dr. Laural Golden and patient's oncologist, Dr. Milinda Hirschfeld. -Loperamide 62m 1 tab 2 to 3 times daily as needed

## 2019-06-22 NOTE — Progress Notes (Signed)
GI

## 2019-06-25 LAB — GASTROINTESTINAL PATHOGEN PANEL PCR
C. difficile Tox A/B, PCR: NOT DETECTED
Campylobacter, PCR: NOT DETECTED
Cryptosporidium, PCR: NOT DETECTED
E coli (ETEC) LT/ST PCR: NOT DETECTED
E coli (STEC) stx1/stx2, PCR: NOT DETECTED
E coli 0157, PCR: NOT DETECTED
Giardia lamblia, PCR: NOT DETECTED
Norovirus, PCR: NOT DETECTED
Rotavirus A, PCR: NOT DETECTED
Salmonella, PCR: NOT DETECTED
Shigella, PCR: NOT DETECTED

## 2019-06-28 ENCOUNTER — Telehealth (INDEPENDENT_AMBULATORY_CARE_PROVIDER_SITE_OTHER): Payer: Self-pay | Admitting: Nurse Practitioner

## 2019-06-28 ENCOUNTER — Other Ambulatory Visit (HOSPITAL_COMMUNITY): Payer: Self-pay | Admitting: *Deleted

## 2019-06-28 ENCOUNTER — Ambulatory Visit (HOSPITAL_COMMUNITY): Payer: Medicare Other

## 2019-06-28 ENCOUNTER — Other Ambulatory Visit (INDEPENDENT_AMBULATORY_CARE_PROVIDER_SITE_OTHER): Payer: Self-pay | Admitting: Nurse Practitioner

## 2019-06-28 DIAGNOSIS — C9001 Multiple myeloma in remission: Secondary | ICD-10-CM

## 2019-06-28 DIAGNOSIS — R197 Diarrhea, unspecified: Secondary | ICD-10-CM

## 2019-06-28 MED ORDER — SANDOSTATIN LAR DEPOT 10 MG IM KIT: 10.0000 mg | PACK | INTRAMUSCULAR | 0 refills | Status: AC

## 2019-06-28 MED ORDER — LENALIDOMIDE 10 MG PO CAPS: ORAL_CAPSULE | ORAL | 0 refills | Status: DC

## 2019-06-28 NOTE — Telephone Encounter (Signed)
I spoke to the patient and wife, discussed GI panel was negative. I consulted with Dr. Laural Golden, he advised checking a pancreatic elastase stool test and celiac panel, ok to initiate Sandostatin LAR Depot 10mg  IM Q month. Wife will pick up new lab order from office within the week.

## 2019-06-28 NOTE — Telephone Encounter (Signed)
Spouse left voice mail message about patients test results - would like a call back at [phone]

## 2019-06-28 NOTE — Telephone Encounter (Signed)
Mitzie, Please have lab order ready for patient to pick up at front desk, I will bring lab order to you.

## 2019-06-30 DIAGNOSIS — R197 Diarrhea, unspecified: Secondary | ICD-10-CM | POA: Diagnosis not present

## 2019-07-06 DIAGNOSIS — D509 Iron deficiency anemia, unspecified: Secondary | ICD-10-CM | POA: Diagnosis not present

## 2019-07-06 DIAGNOSIS — R7301 Impaired fasting glucose: Secondary | ICD-10-CM | POA: Diagnosis not present

## 2019-07-06 DIAGNOSIS — I10 Essential (primary) hypertension: Secondary | ICD-10-CM | POA: Diagnosis not present

## 2019-07-06 DIAGNOSIS — I2510 Atherosclerotic heart disease of native coronary artery without angina pectoris: Secondary | ICD-10-CM | POA: Diagnosis not present

## 2019-07-06 DIAGNOSIS — E782 Mixed hyperlipidemia: Secondary | ICD-10-CM | POA: Diagnosis not present

## 2019-07-06 LAB — CELIAC DISEASE PANEL
(tTG) Ab, IgA: 1 U/mL
(tTG) Ab, IgG: 2 U/mL
Gliadin IgA: 11 Units
Gliadin IgG: 2 Units
Immunoglobulin A: 529 mg/dL — ABNORMAL HIGH (ref 70–320)

## 2019-07-06 LAB — C-REACTIVE PROTEIN: CRP: 7.5 mg/L (ref <8.0)

## 2019-07-06 LAB — PANCREATIC ELASTASE, FECAL: Pancreatic Elastase-1, Stool: 499 mcg/g

## 2019-07-07 ENCOUNTER — Other Ambulatory Visit: Payer: Self-pay | Admitting: Cardiovascular Disease

## 2019-07-07 ENCOUNTER — Encounter: Payer: Medicare Other | Admitting: *Deleted

## 2019-07-14 DIAGNOSIS — C8590 Non-Hodgkin lymphoma, unspecified, unspecified site: Secondary | ICD-10-CM | POA: Diagnosis not present

## 2019-07-14 DIAGNOSIS — R7301 Impaired fasting glucose: Secondary | ICD-10-CM | POA: Diagnosis not present

## 2019-07-14 DIAGNOSIS — I5032 Chronic diastolic (congestive) heart failure: Secondary | ICD-10-CM | POA: Diagnosis not present

## 2019-07-14 DIAGNOSIS — I129 Hypertensive chronic kidney disease with stage 1 through stage 4 chronic kidney disease, or unspecified chronic kidney disease: Secondary | ICD-10-CM | POA: Diagnosis not present

## 2019-07-14 DIAGNOSIS — K219 Gastro-esophageal reflux disease without esophagitis: Secondary | ICD-10-CM | POA: Diagnosis not present

## 2019-07-14 DIAGNOSIS — D631 Anemia in chronic kidney disease: Secondary | ICD-10-CM | POA: Diagnosis not present

## 2019-07-14 DIAGNOSIS — J449 Chronic obstructive pulmonary disease, unspecified: Secondary | ICD-10-CM | POA: Diagnosis not present

## 2019-07-14 DIAGNOSIS — N183 Chronic kidney disease, stage 3 (moderate): Secondary | ICD-10-CM | POA: Diagnosis not present

## 2019-07-14 DIAGNOSIS — M19019 Primary osteoarthritis, unspecified shoulder: Secondary | ICD-10-CM | POA: Diagnosis not present

## 2019-07-14 DIAGNOSIS — D696 Thrombocytopenia, unspecified: Secondary | ICD-10-CM | POA: Diagnosis not present

## 2019-07-14 DIAGNOSIS — I2510 Atherosclerotic heart disease of native coronary artery without angina pectoris: Secondary | ICD-10-CM | POA: Diagnosis not present

## 2019-07-14 DIAGNOSIS — I4891 Unspecified atrial fibrillation: Secondary | ICD-10-CM | POA: Diagnosis not present

## 2019-07-15 ENCOUNTER — Ambulatory Visit (HOSPITAL_COMMUNITY): Payer: Medicare Other

## 2019-07-15 ENCOUNTER — Encounter: Payer: Self-pay | Admitting: Cardiology

## 2019-07-16 ENCOUNTER — Other Ambulatory Visit (HOSPITAL_COMMUNITY): Payer: Self-pay | Admitting: Internal Medicine

## 2019-07-16 DIAGNOSIS — R110 Nausea: Secondary | ICD-10-CM

## 2019-07-16 DIAGNOSIS — R197 Diarrhea, unspecified: Secondary | ICD-10-CM

## 2019-07-17 NOTE — Progress Notes (Signed)
   Subjective:    Patient ID: Jeremy Parrish, male    DOB: 03-Nov-1941, 78 y.o.   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  He was seen in office on 06/22/2019 with increased episodes of diarrhea. GI pathogen was negative. Sandostatin LAR Depot was ordered, awaiting insurance authorization.   He is having diarrhea 2 ot 3 hours after he eats. Some days he has diarrhea 5 to 6 episodes. He has occasional good days describes as having only 4 episodes of diarrhea. He has night night time diarrhea once weekly. No blood diarrhea. No abdominal pain. He occasionally has a soft formed stool that falls apart in the toilet water. No fever. Weight is stable. On Revlimid 28m bid on 7 day, off 7 days, last took one week ago. His wife is present.      Objective:   Physical Exam BP 118/78   Pulse 73   Temp 97.7 F (36.5 C)   Ht _0  (1.727 m)   Wt 176 lb 8 oz (80.1 kg)   BMI 26.84 kg/m   General: 78year old male with mild expressive aphasia ambulates slowly with assistance of a cane in no acute distress Eyes: Sclera nonicteric, conjunctiva pink Heart: Irregular rhythm, no murmurs Lungs: Breath sounds clear throughout Abdomen: Soft, nondistended, no masses or organomegaly, midline lower abdominal scar intact Extremities: No edema, brawny discoloration to distal lower extremities bilaterally Neuro: Alert and oriented x4    Assessment &  Plan:   1.  Chronic diarrhea, most likely due to Revlimid.  GI panel was negative. -Patient to schedule nurse appointment for Sandostatin LAR depot 10 mg IM injection once received from pharmacy -Loperamide 2 mg 2 tabs p.o. twice daily, reduced dose if patient develops solid stool, stop if no bowel movement in 24 hours -Follow-up in office in 3 months and as needed  2.  GERD, stable -Continue omeprazole 20 mg p.o. daily  3. Multiple myeloma and hypergammaglobulinemia on Revlimid and IVIG

## 2019-07-20 ENCOUNTER — Other Ambulatory Visit: Payer: Self-pay

## 2019-07-20 ENCOUNTER — Encounter (INDEPENDENT_AMBULATORY_CARE_PROVIDER_SITE_OTHER): Payer: Self-pay | Admitting: Nurse Practitioner

## 2019-07-20 ENCOUNTER — Telehealth (INDEPENDENT_AMBULATORY_CARE_PROVIDER_SITE_OTHER): Payer: Self-pay | Admitting: Nurse Practitioner

## 2019-07-20 ENCOUNTER — Ambulatory Visit (INDEPENDENT_AMBULATORY_CARE_PROVIDER_SITE_OTHER): Payer: Medicare Other | Admitting: Nurse Practitioner

## 2019-07-20 ENCOUNTER — Ambulatory Visit (HOSPITAL_COMMUNITY): Payer: Medicare Other

## 2019-07-20 VITALS — BP 118/78 | HR 73 | Temp 97.7°F | Ht 68.0 in | Wt 176.5 lb

## 2019-07-20 DIAGNOSIS — R197 Diarrhea, unspecified: Secondary | ICD-10-CM | POA: Diagnosis not present

## 2019-07-20 MED ORDER — LOPERAMIDE HCL 2 MG PO CAPS: 4.0000 mg | ORAL_CAPSULE | Freq: Two times a day (BID) | ORAL | 0 refills | Status: DC | PRN

## 2019-07-20 NOTE — Telephone Encounter (Signed)
Spouse called stated she wants to talk to you about a medication patient is being put on - please call [phone]

## 2019-07-20 NOTE — Patient Instructions (Addendum)
1. Continue Imodium (Loperamide) 2mg  two tabs by mouth twice daily, reduce dose if stool becomes solid and stop if no bowel movement in 24 hours  2. Contact your pharmacy to check on the status of the Sandostatin LAR Depot injection. When received, call our office and schedule a nurse appointment with Tammy who will administer the Sandostatin injection.   3. Call our office if your diarrhea worsens   4. Follow up in office in 3 months

## 2019-07-21 ENCOUNTER — Other Ambulatory Visit (HOSPITAL_COMMUNITY): Payer: Self-pay | Admitting: *Deleted

## 2019-07-21 DIAGNOSIS — C9001 Multiple myeloma in remission: Secondary | ICD-10-CM

## 2019-07-21 MED ORDER — LENALIDOMIDE 10 MG PO CAPS: ORAL_CAPSULE | ORAL | 0 refills | Status: DC

## 2019-07-26 ENCOUNTER — Encounter (HOSPITAL_COMMUNITY): Payer: Self-pay | Admitting: Hematology

## 2019-07-26 ENCOUNTER — Inpatient Hospital Stay (HOSPITAL_COMMUNITY): Payer: Medicare Other | Attending: Hematology

## 2019-07-26 ENCOUNTER — Inpatient Hospital Stay (HOSPITAL_BASED_OUTPATIENT_CLINIC_OR_DEPARTMENT_OTHER): Payer: Medicare Other | Admitting: Hematology

## 2019-07-26 ENCOUNTER — Other Ambulatory Visit: Payer: Self-pay

## 2019-07-26 VITALS — BP 119/83 | HR 96 | Temp 96.9°F | Resp 18 | Wt 178.0 lb

## 2019-07-26 DIAGNOSIS — E538 Deficiency of other specified B group vitamins: Secondary | ICD-10-CM | POA: Diagnosis not present

## 2019-07-26 DIAGNOSIS — Z8572 Personal history of non-Hodgkin lymphomas: Secondary | ICD-10-CM | POA: Insufficient documentation

## 2019-07-26 DIAGNOSIS — C9000 Multiple myeloma not having achieved remission: Secondary | ICD-10-CM

## 2019-07-26 DIAGNOSIS — C9001 Multiple myeloma in remission: Secondary | ICD-10-CM

## 2019-07-26 DIAGNOSIS — R197 Diarrhea, unspecified: Secondary | ICD-10-CM | POA: Diagnosis not present

## 2019-07-26 DIAGNOSIS — D801 Nonfamilial hypogammaglobulinemia: Secondary | ICD-10-CM | POA: Diagnosis not present

## 2019-07-26 LAB — COMPREHENSIVE METABOLIC PANEL
ALT: 11 U/L (ref 0–44)
AST: 17 U/L (ref 15–41)
Albumin: 3.3 g/dL — ABNORMAL LOW (ref 3.5–5.0)
Alkaline Phosphatase: 55 U/L (ref 38–126)
Anion gap: 7 (ref 5–15)
BUN: 8 mg/dL (ref 8–23)
CO2: 22 mmol/L (ref 22–32)
Calcium: 8.5 mg/dL — ABNORMAL LOW (ref 8.9–10.3)
Chloride: 107 mmol/L (ref 98–111)
Creatinine, Ser: 1.07 mg/dL (ref 0.61–1.24)
GFR calc Af Amer: >60 mL/min (ref >60)
GFR calc non Af Amer: >60 mL/min (ref >60)
Glucose, Bld: 131 mg/dL — ABNORMAL HIGH (ref 70–99)
Potassium: 3.3 mmol/L — ABNORMAL LOW (ref 3.5–5.1)
Sodium: 136 mmol/L (ref 135–145)
Total Bilirubin: 0.7 mg/dL (ref 0.3–1.2)
Total Protein: 6.5 g/dL (ref 6.5–8.1)

## 2019-07-26 LAB — CBC WITH DIFFERENTIAL/PLATELET
Abs Immature Granulocytes: 0.01 10*3/uL (ref 0.00–0.07)
Basophils Absolute: 0.1 10*3/uL (ref 0.0–0.1)
Basophils Relative: 2 %
Eosinophils Absolute: 0.1 10*3/uL (ref 0.0–0.5)
Eosinophils Relative: 4 %
HCT: 35.1 % — ABNORMAL LOW (ref 39.0–52.0)
Hemoglobin: 10.9 g/dL — ABNORMAL LOW (ref 13.0–17.0)
Immature Granulocytes: 0 %
Lymphocytes Relative: 59 %
Lymphs Abs: 2.2 10*3/uL (ref 0.7–4.0)
MCH: 28.6 pg (ref 26.0–34.0)
MCHC: 31.1 g/dL (ref 30.0–36.0)
MCV: 92.1 fL (ref 80.0–100.0)
Monocytes Absolute: 0.3 10*3/uL (ref 0.1–1.0)
Monocytes Relative: 8 %
Neutro Abs: 1 10*3/uL — ABNORMAL LOW (ref 1.7–7.7)
Neutrophils Relative %: 27 %
Platelets: 123 10*3/uL — ABNORMAL LOW (ref 150–400)
RBC: 3.81 MIL/uL — ABNORMAL LOW (ref 4.22–5.81)
RDW: 16.5 % — ABNORMAL HIGH (ref 11.5–15.5)
WBC: 3.7 10*3/uL — ABNORMAL LOW (ref 4.0–10.5)
nRBC: 0 % (ref 0.0–0.2)

## 2019-07-26 NOTE — Progress Notes (Signed)
[facility] [address], [place] [address]   CLINIC:  Medical Oncology/Hematology  PCP:  Celene Squibb, MD [place] [address] [phone]   REASON FOR VISIT: Follow-up for multiple myeloma and hypogammaglobulinemia  CURRENT THERAPY: Revlimid 7 days on 7 days off   INTERVAL HISTORY:  Jeremy Parrish 78 y.o. male seen for follow-up of multiple myeloma, hypogammaglobulinemia, B12 deficiency.  He denies any recurrent infections.  He receives IVIG every 6 weeks.  He was started on Xanax 0.25 mg at bedtime for sleep.  He has only noticed mild improvement.  He also reported occasional pain at the port site, sharp in nature lasting few minutes.  Denies any fevers or chills.  Tolerating Revlimid 7 days on 7 days off very well.  Chronic diarrhea is stable.  No ER visits or hospitalizations.  Denies any nausea vomiting or constipation.   REVIEW OF SYSTEMS:  Review of Systems  Gastrointestinal: Positive for diarrhea.  Neurological: Positive for numbness.  Psychiatric/Behavioral: Positive for sleep disturbance.  All other systems reviewed and are negative.    PAST MEDICAL/SURGICAL HISTORY:  Past Medical History:  Diagnosis Date  . Anemia   . Aortic aneurysm of unspecified site without mention of rupture   . Arthritis   . Bladder neck contracture   . Cancer ([place])   . Cerebral atherosclerosis    Carotid Doppler, 02/16/2013 - Bilateral Proximal ICAs,demonstrate mild plaque w/o evidence of significant diameter reduction, dissection, or any other vascular abnormality  . CHF (congestive heart failure) ([place])   . Complication of anesthesia   . COPD (chronic obstructive pulmonary disease) ([place])   . Coronary artery disease   . Depression   . Esophageal reflux   . Heart disease   . Heart murmur   . Hx of bladder cancer 10/07/2012  . Hyperlipidemia   . Hypertension   . 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 Intestinovesical fistula   . Kidney stones    history  . Lung mass   . Multiple myeloma   . Myocardial infarction [facility])    '96  . Non Hodgkin's lymphoma ([place])   . Paroxysmal atrial fibrillation ([place]) 01/02/2016  . Peripheral arterial disease ([place])   . Personal history of other diseases of circulatory system   . PONV (postoperative nausea and vomiting)   . Prostate cancer ([place]) 2000  . Shingles   . Shortness of breath   . Sleep apnea    05-02-14 cpap , not yet used- suggested settings 5  . Stroke [facility]) 2013   Speech.   Past Surgical History:  Procedure Laterality Date  . BLADDER SURGERY    . BONE MARROW TRANSPLANT  2011  . CARDIOVERSION N/A 06/02/2019   Procedure: CARDIOVERSION;  Surgeon: Sanda Klein, MD;  [facility] END[facility];  Service: Cardiovascular;  Laterality: N/A;  . COLON SURGERY     colon resection  . COLONOSCOPY N/A 01/01/2013   Procedure: COLONOSCOPY;  Surgeon: Rogene Houston, MD;  Location: [facility] ENDO SUITE;  Service: Endoscopy;  Laterality: N/A;  825-moved to [place] notified pt  . COLONOSCOPY N/A 07/16/2018   Procedure: COLONOSCOPY;  Surgeon: Rogene Houston, MD;  Location: [facility] ENDO SUITE;  Service: Endoscopy;  Laterality: N/A;  1:25  . CORONARY ANGIOPLASTY  06/24/2000   PCI and stenting in mid & proximal RCA  . heart stents x 5  1999  . INGUINAL HERNIA REPAIR  Right 05/04/2014   Procedure: OPEN RIGHT INGUINAL HERNIA REPAIR with mesh;  Surgeon: Edward Jolly, MD;  Location: [facility] ORS;  Service: General;  Laterality: Right;  . INSERT / REPLACE / REMOVE PACEMAKER    . left ear skin cancer removed    . NM MYOCAR PERF WALL MOTION  11/27/2007   inferior scar  . PACEMAKER INSERTION  07/22/2011   [employer]  . POLYPECTOMY  07/16/2018   Procedure: POLYPECTOMY;  Surgeon: Rogene Houston, MD;  Location: [facility] END[facility] SU[facility];  Service: Endoscopy;;  colon  . PORTACATH PLACEMENT  07/26/2009   right chest  . PROSTATE SURGERY    . Rotator    . ROTATOR  CUFF REPAIR Right   . SHOULDER ARTHROSCOPY WITH SUBACROMIAL DECOMPRESSION Right 07/21/2013   Procedure: RIGHT SHOULDER ARTHROSCOPY WITH SUBACROMIAL DECOMPRESSION AND DEBRIDEMENT & Injection of Left Shoulder;  Surgeon: Alta Corning, MD;  Location: [place];  Service: Orthopedics;  Laterality: Right;  . TEE WITHOUT CARDIOVERSION  10/13/2012   Procedure: TRANSESOPHAGEAL ECHOCARDIOGRAM (TEE);  Surgeon: Sanda Klein, MD;  Location: [facility] ENDOSCOPY;  Service: Cardiovascular;  Laterality: N/A;  pat/kay/echo notified  . US ECHOCARDIOGRAPHY  06/19/2011   RV mildly dilated,mild to mod. MR,mild AI,mild PI  . WRIST SURGERY     right     SOCIAL HISTORY:  Social History   Socioeconomic History  . Marital status: Married    Spouse name: [name]  . Number of children: 3  . Years of education: 9th  . Highest education level: Not on file  Occupational History  . Occupation: retired   Scientific laboratory technician  . Financial resource strain: Not on file  . Food insecurity    Worry: Not on file    Inability: Not on file  . Transportation needs    Medical: Not on file    Non-medical: Not on file  Tobacco Use  . Smoking status: Former Smoker    Packs/day: 1.00    Years: 20.00    Pack years: 20.00    Types: Cigarettes    Quit date: 11/14/1994    Years since quitting: 24.7  . Smokeless tobacco: Never Used  Substance and Sexual Activity  . Alcohol use: No    Alcohol/week: 0.0 standard drinks    Comment: previously drank but none for at least 15 years.  . Drug use: No  . Sexual activity: Not on file  Lifestyle  . Physical activity    Days per week: Not on file    Minutes per session: Not on file  . Stress: Not on file  Relationships  . Social Herbalist on phone: Not on file    Gets together: Not on file    Attends religious service: Not on file    Active member of club or organization: Not on file    Attends meetings of clubs or organizations: Not on file    Relationship status: Not on file  .  Intimate partner violence    Fear of current or ex partner: Not on file    Emotionally abused: Not on file    Physically abused: Not on file    Forced sexual activity: Not on file  Other Topics Concern  . Not on file  Social History Narrative   Patient lives at home spouse.   Caffeine Use: Occasionally    FAMILY HISTORY:  Family History  Problem Relation Age of Onset  . Cancer Father        bladder  .  Heart disease Father        before age 54  . Hypertension Mother   . Cancer Brother   . Heart disease Brother        before age 45  . Heart disease Sister        before age 87  . Hyperlipidemia Sister   . Hypertension Sister   . Heart attack Sister   . Colon cancer Neg Hx   . Colon polyps Neg Hx     CURRENT MEDICATIONS:  Outpatient Encounter Medications as of 07/26/2019  Medication Sig  . amLODipine (NORVASC) 5 MG tablet Take 5 mg by mouth daily.  . clopidogrel (PLAVIX) 75 MG tablet TAKE 1 TABLET BY MOUTH EVERY DAY  . diltiazem (CARDIZEM CD) 120 MG 24 hr capsule Take 1 capsule (120 mg total) by mouth daily.  [place] Kitchen ELIQUIS 5 MG TABS tablet TAKE 1 TABLET BY MOUTH TWICE A DAY  . lenalidomide (REVLIMID) 10 MG capsule TAKE 1 CAPSULE BY MOUTH DAILY FOR 7 DAYS ON FOLLOWED BY 7 DAYS OFF. THEN REPEAT CYCLE.  Mar[place] Kitchen levocetirizine (XYZAL) 5 MG tablet Take 5 mg by mouth every evening.   . loperamide (IMODIUM) 2 MG capsule Take 2 capsules (4 mg total) by mouth 2 (two) times daily as needed for diarrhea or loose stools.  Marland Kitchen octreotide (SANDOSTATIN LAR DEPOT) 10 MG injection Inject 10 mg into the muscle every 28 (twenty-eight) days.  Marland Kitchen omeprazole (PRILOSEC) 20 MG capsule Take 20 mg by mouth daily.  . potassium chloride SA (KLOR-CON M20) 20 MEQ tablet Take 1 tablet (20 mEq total) by mouth 3 (three) times daily.  . simvastatin (ZOCOR) 20 MG tablet TAKE 1 TABLET BY MOUTH EVERYDAY AT BEDTIME (Patient taking differently: Take 40 mg by mouth at bedtime. )  . albuterol (PROVENTIL HFA;VENTOLIN HFA) 108 (90  Base) MCG/ACT inhaler Inhale 2 puffs into the lungs every 6 (six) hours as needed for wheezing or shortness of breath. (Patient not taking: Reported on 07/26/2019)  . albuterol (PROVENTIL) (2.5 MG/3ML) 0.083% nebulizer solution Take 2.5 mg by nebulization every 4 (four) hours as needed for wheezing or shortness of breath.   . ALPRAZolam (XANAX) 0.25 MG tablet   . furosemide (LASIX) 20 MG tablet Use sparingly as needed for edema. No more than one tablet by mouth per week. (Patient not taking: Reported on 07/26/2019)  . HYDROcodone-acetaminophen (NORCO) 7.5-325 MG tablet   . HYDROcodone-acetaminophen (NORCO/VICODIN) 5-325 MG tablet Take 1 tablet by mouth 2 (two) times daily as needed. for pain  . ipratropium-albuterol (DUONEB) 0.5-2.5 (3) MG/3ML SOLN Take 3 mLs by nebulization every 6 (six) hours as needed (shortness of breath).   . methocarbamol (ROBAXIN) 750 MG tablet Take 750 mg by mouth every 8 (eight) hours as needed for muscle spasms.   . mometasone (NASONEX) 50 MCG/ACT nasal spray Place 2 sprays into the nose daily as needed (allergies).   . nitroGLYCERIN (NITROSTAT) 0.4 MG SL tablet Place 1 tablet (0.4 mg total) under the tongue every 5 (five) minutes as needed for chest pain. (Patient not taking: Reported on 07/26/2019)  . ondansetron (ZOFRAN) 4 MG tablet Take 4 mg by mouth every 8 (eight) hours as needed for nausea or vomiting.   . [DISCONTINUED] gabapentin (NEURONTIN) 100 MG capsule    Facility-Administered Encounter Medications as of 07/26/2019  Medication  . acetaminophen (TYLENOL) tablet 650 mg  . sodium chloride 0.9 % injection 10 mL  . sodium chloride flush (NS) 0.9 % injection 3 mL  ALLERGIES:  Allergies  Allergen Reactions  . Morphine And Related Other (See Comments)    hallucinations  . Tape Rash    Paper tape is ok     PHYSICAL EXAM:  ECOG Performance status: 1  Vitals:   07/26/19 1449  BP: 119/83  Pulse: 96  Resp: 18  Temp: (!) 96.9 F (36.1 C)  SpO2: 97%    Filed Weights   07/26/19 1449  Weight: 178 lb (80.7 kg)    Physical Exam Constitutional:      Appearance: He is well-developed.  Cardiovascular:     Rate and Rhythm: Normal rate and regular rhythm.     Heart sounds: Normal heart sounds.  Pulmonary:     Effort: Pulmonary effort is normal.     Breath sounds: Normal breath sounds.  Abdominal:     General: There is no distension.     Palpations: Abdomen is soft. There is no mass.  Musculoskeletal: Normal range of motion.  Lymphadenopathy:     Cervical: No cervical adenopathy.  Skin:    General: Skin is warm.  Neurological:     Mental Status: He is alert and oriented to person, place, and time.  Psychiatric:        Behavior: Behavior normal.        Thought Content: Thought content normal.        Judgment: Judgment normal.      LABORATORY DATA:  I have reviewed the labs as listed.  CBC    Component Value Date/Time   WBC 3.7 (L) 07/26/2019 1409   RBC 3.81 (L) 07/26/2019 1409   HGB 10.9 (L) 07/26/2019 1409   HGB 11.2 (L) 05/31/2019 1020   HCT 35.1 (L) 07/26/2019 1409   HCT 34.7 (L) 05/31/2019 1020   PLT 123 (L) 07/26/2019 1409   PLT 123 (L) 05/31/2019 1020   MCV 92.1 07/26/2019 1409   MCV 89 05/31/2019 1020   MCH 28.6 07/26/2019 1409   MCHC 31.1 07/26/2019 1409   RDW 16.5 (H) 07/26/2019 1409   RDW 15.3 05/31/2019 1020   LYMPHSABS 2.2 07/26/2019 1409   MONOABS 0.3 07/26/2019 1409   EOSABS 0.1 07/26/2019 1409   BASOSABS 0.1 07/26/2019 1409   CMP Latest Ref Rng & Units 07/26/2019 06/14/2019 05/31/2019  Glucose 70 - 99 mg/dL 131(H) 96 85  BUN 8 - 23 mg/dL 8 10 13   Creatinine 0.61 - 1.24 mg/dL 1.07 1.13 1.01  Sodium 135 - 145 mmol/L 136 142 140  Potassium 3.5 - 5.1 mmol/L 3.3(L) 4.6 4.6  Chloride 98 - 111 mmol/L 107 109 106  CO2 22 - 32 mmol/L 22 24 21   Calcium 8.9 - 10.3 mg/dL 8.5(L) 9.0 9.1  Total Protein 6.5 - 8.1 g/dL 6.5 6.7 -  Total Bilirubin 0.3 - 1.2 mg/dL 0.7 0.7 -  Alkaline Phos 38 - 126 U/L 55 69 -  AST  15 - 41 U/L 17 17 -  ALT 0 - 44 U/L 11 18 -    I have independently reviewed the scans and discussed with the patient.     ASSESSMENT & PLAN:   Multiple myeloma in remission 1.  IgG lambda multiple myeloma: -Status post auto stem cell transplant at [facility] on 08/25/2019 -Revlimid maintenance 5 mg 7 days on/7 days off since 2011, well-tolerated except occasional diarrhea - We reviewed myeloma labs from 06/14/2019 which shows no M spike and polyclonal gammopathy on immunofixation.  Free light chain ratio was 1.6.  Kappa light chains were 80.2  and lambda light chains 50.2. - I plan to repeat his myeloma labs in 2 months.  2.  Hypogammaglobulinemia: - He is receiving IVIG at 400 mg/kg every 6 weeks. - IgG trough level on 06/14/2019 was 1026.  He does not have any recurrent infections.  3.  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  Physical exam today did not reveal any adenopathy or splenomegaly.  4.  Sleep problems: - He has used Xanax 0.25 mg at bedtime which helped partially.  I have told him to increase it to 0.5 mg at bedtime.  If it helps we will give a prescription for 0.5 mg.  5.  Vitamin B12 deficiency: -He has been receiving monthly B12 injections for years.  We will discontinue it and check his B12 level next visit.  6.  Port pain: -He reports occasional pain at the port site, sharp stabbing type lasting few minutes. -His port is giving a good blood return.  He does not have any signs of infection over the port today. - If there is any problems, will consider portogram or x-ray.   Total time spent is 25 minutes with more than 50% of the time spent face-to-face discussing lab results, treatment plan, counseling and coordination of care.  Orders placed this encounter:  Orders Placed This Encounter  Procedures  . CBC with Differential/Platelet  . Comprehensive metabolic panel  . Protein electrophoresis, serum  . Kappa/lambda light  chains  . Lactate dehydrogenase  . IgG, IgA, IgM  . Vitamin B12      Derek Jack, [place] [phone]

## 2019-07-26 NOTE — Assessment & Plan Note (Signed)
1.  IgG lambda multiple myeloma: -Status post auto stem cell transplant at [facility] on 08/25/2019 -Revlimid maintenance 5 mg 7 days on/7 days off since 2011, well-tolerated except occasional diarrhea - We reviewed myeloma labs from 06/14/2019 which shows no M spike and polyclonal gammopathy on immunofixation.  Free light chain ratio was 1.6.  Kappa light chains were 80.2 and lambda light chains 50.2. - I plan to repeat his myeloma labs in 2 months.  2.  Hypogammaglobulinemia: - He is receiving IVIG at 400 mg/kg every 6 weeks. - IgG trough level on 06/14/2019 was 1026.  He does not have any recurrent infections.  3.  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  Physical exam today did not reveal any adenopathy or splenomegaly.  4.  Sleep problems: - He has used Xanax 0.25 mg at bedtime which helped partially.  I have told him to increase it to 0.5 mg at bedtime.  If it helps we will give a prescription for 0.5 mg.  5.  Vitamin B12 deficiency: -He has been receiving monthly B12 injections for years.  We will discontinue it and check his B12 level next visit.  6.  Port pain: -He reports occasional pain at the port site, sharp stabbing type lasting few minutes. -His port is giving a good blood return.  He does not have any signs of infection over the port today. - If there is any problems, will consider portogram or x-ray.

## 2019-07-26 NOTE — Patient Instructions (Addendum)
[place] at [facility] Discharge Instructions  You were seen today by Dr. Delton Coombes. He went over your recent lab results. You can stop getting the B12 injections. Caontinue taking your treatments every 6 weeks. He will see you back in 2 months for labs and follow up.  Try taking 2 tablets of Xanax at night to help you sleep. Let us know if that helps.  Thank you for choosing [place] at [facility] to provide your oncology and hematology care.  To afford each patient quality time with our provider, please arrive at least 15 minutes before your scheduled appointment time.   If you have a lab appointment with the [facility] please come in thru the  Main Entrance and check in at the main information desk  You need to re-schedule your appointment should you arrive 10 or more minutes late.  We strive to give you quality time with our providers, and arriving late affects you and other patients whose appointments are after yours.  Also, if you no show three or more times for appointments you may be dismissed from the clinic at the providers discretion.     Again, thank you for choosing [facility].  Our hope is that these requests will decrease the amount of time that you wait before being seen by our physicians.       _____________________________________________________________  Should you have questions after your visit to [facility], please contact our office at [phone] between the hours of 8:00 a.m. and 4:30 p.m.  Voicemails left after 4:00 p.m. will not be returned until the following business day.  For prescription refill requests, have your pharmacy contact our office and allow 72 hours.    Cancer Center Support Programs:   > Cancer Support Group  2nd Tuesday of the month 1pm-2pm, Journey Room

## 2019-07-26 NOTE — Telephone Encounter (Signed)
I called wife, left msg for her to call me with sandostatin update

## 2019-07-27 ENCOUNTER — Encounter (HOSPITAL_COMMUNITY): Payer: Self-pay

## 2019-07-27 ENCOUNTER — Inpatient Hospital Stay (HOSPITAL_COMMUNITY): Payer: Medicare Other

## 2019-07-27 VITALS — BP 128/73 | HR 85 | Temp 98.0°F | Resp 18 | Wt 177.0 lb

## 2019-07-27 DIAGNOSIS — E538 Deficiency of other specified B group vitamins: Secondary | ICD-10-CM

## 2019-07-27 DIAGNOSIS — R197 Diarrhea, unspecified: Secondary | ICD-10-CM | POA: Diagnosis not present

## 2019-07-27 DIAGNOSIS — D801 Nonfamilial hypogammaglobulinemia: Secondary | ICD-10-CM | POA: Diagnosis not present

## 2019-07-27 DIAGNOSIS — Z8572 Personal history of non-Hodgkin lymphomas: Secondary | ICD-10-CM | POA: Diagnosis not present

## 2019-07-27 DIAGNOSIS — C9001 Multiple myeloma in remission: Secondary | ICD-10-CM | POA: Diagnosis not present

## 2019-07-27 MED ORDER — HEPARIN SOD (PORK) LOCK FLUSH 100 UNIT/ML IV SOLN: 500.0000 [IU] | Freq: Once | INTRAVENOUS | Status: DC

## 2019-07-27 MED ORDER — SODIUM CHLORIDE 0.9% FLUSH
10.0000 mL | INTRAVENOUS | Status: DC | PRN
  Administered 2019-07-27: 10 mL via INTRAVENOUS
  Filled 2019-07-27: qty 10

## 2019-07-27 MED ORDER — IMMUNE GLOBULIN (HUMAN) 10 GM/100ML IV SOLN
400.0000 mg/kg | Freq: Once | INTRAVENOUS | Status: AC
  Administered 2019-07-27: 30 g via INTRAVENOUS
  Filled 2019-07-27: qty 100

## 2019-07-27 MED ORDER — SODIUM CHLORIDE 0.9 % IV SOLN
Freq: Once | INTRAVENOUS | Status: AC
  Administered 2019-07-27: 09:00:00 via INTRAVENOUS
  Filled 2019-07-27: qty 4

## 2019-07-27 MED ORDER — ACETAMINOPHEN 325 MG PO TABS
650.0000 mg | ORAL_TABLET | Freq: Once | ORAL | Status: AC
  Administered 2019-07-27: 650 mg via ORAL
  Filled 2019-07-27: qty 2

## 2019-07-27 MED ORDER — SODIUM CHLORIDE 0.9 % IJ SOLN
10.0000 mL | INTRAMUSCULAR | Status: DC | PRN
  Filled 2019-07-27: qty 10

## 2019-07-27 MED ORDER — HEPARIN SOD (PORK) LOCK FLUSH 100 UNIT/ML IV SOLN
500.0000 [IU] | Freq: Once | INTRAVENOUS | Status: AC | PRN
  Administered 2019-07-27: 500 [IU]

## 2019-07-27 MED ORDER — DEXTROSE 5 % IV SOLN
INTRAVENOUS | Status: DC
  Administered 2019-07-27: 08:00:00 via INTRAVENOUS

## 2019-07-27 NOTE — Progress Notes (Signed)
Jeremy Parrish tolerated IVIG infusion well without complaints or incident. VSS upon discharge. Pt discharged self ambulatory using his cane in satisfactory condition

## 2019-07-27 NOTE — Patient Instructions (Signed)
[facility] at [facility] Discharge Instructions Received IVIG infusion today. Follow-up as scheduled. Call clinic for any questions or concerns   Thank you for choosing [facility] at [facility] to provide your oncology and hematology care.  To afford each patient quality time with our provider, please arrive at least 15 minutes before your scheduled appointment time.   If you have a lab appointment with the Cancer Center please come in thru the Main Entrance and check in at the main information desk.  You need to re-schedule your appointment should you arrive 10 or more minutes late.  We strive to give you quality time with our providers, and arriving late affects you and other patients whose appointments are after yours.  Also, if you no show three or more times for appointments you may be dismissed from the clinic at the providers discretion.     Again, thank you for choosing [facility].  Our hope is that these requests will decrease the amount of time that you wait before being seen by our physicians.       _____________________________________________________________  Should you have questions after your visit to [facility], please contact our office at [phone] between the hours of 8:00 a.m. and 4:30 p.m.  Voicemails left after 4:00 p.m. will not be returned until the following business day.  For prescription refill requests, have your pharmacy contact our office and allow 72 hours.    Due to Covid, you will need to wear a mask upon entering the hospital. If you do not have a mask, a mask will be given to you at the Main Entrance upon arrival. For doctor visits, patients may have 1 support person with them. For treatment visits, patients can not have anyone with them due to social distancing guidelines and our immunocompromised population.     

## 2019-07-28 ENCOUNTER — Ambulatory Visit (INDEPENDENT_AMBULATORY_CARE_PROVIDER_SITE_OTHER): Payer: Medicare Other | Admitting: *Deleted

## 2019-07-28 DIAGNOSIS — I495 Sick sinus syndrome: Secondary | ICD-10-CM | POA: Diagnosis not present

## 2019-07-29 ENCOUNTER — Telehealth: Payer: Self-pay | Admitting: *Deleted

## 2019-07-29 LAB — CUP PACEART REMOTE DEVICE CHECK
Battery Voltage: 2.89 V
Brady Statistic AP VP Percent: 0.87 %
Brady Statistic AP VS Percent: 0.16 %
Brady Statistic AS VP Percent: 13.15 %
Brady Statistic AS VS Percent: 85.82 %
Brady Statistic RA Percent Paced: 0.68 %
Brady Statistic RV Percent Paced: 14.27 %
Date Time Interrogation Session: 20200916172657
Implantable Lead Implant Date: 20120910
Implantable Lead Implant Date: 20120910
Implantable Lead Location: 753859
Implantable Lead Location: 753860
Implantable Pulse Generator Implant Date: 20120910
Lead Channel Impedance Value: 364 Ohm
Lead Channel Impedance Value: 372 Ohm
Lead Channel Sensing Intrinsic Amplitude: 0.843 mV
Lead Channel Setting Pacing Amplitude: 2 V
Lead Channel Setting Pacing Amplitude: 2.5 V
Lead Channel Setting Pacing Pulse Width: 0.6 ms
Lead Channel Setting Sensing Sensitivity: 0.9 mV

## 2019-07-29 NOTE — Telephone Encounter (Signed)
LMOM requesting call back to DC. Direct number given.  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 S/P DCCV in 05/2019. Will confirm pt taking Eliquis, discuss any symptoms.

## 2019-08-03 ENCOUNTER — Encounter: Payer: Self-pay | Admitting: Cardiology

## 2019-08-03 NOTE — Progress Notes (Signed)
Remote pacemaker transmission.   

## 2019-08-04 DIAGNOSIS — R42 Dizziness and giddiness: Secondary | ICD-10-CM | POA: Diagnosis not present

## 2019-08-04 DIAGNOSIS — I129 Hypertensive chronic kidney disease with stage 1 through stage 4 chronic kidney disease, or unspecified chronic kidney disease: Secondary | ICD-10-CM | POA: Diagnosis not present

## 2019-08-04 DIAGNOSIS — N183 Chronic kidney disease, stage 3 (moderate): Secondary | ICD-10-CM | POA: Diagnosis not present

## 2019-08-05 DIAGNOSIS — M67911 Unspecified disorder of synovium and tendon, right shoulder: Secondary | ICD-10-CM | POA: Diagnosis not present

## 2019-08-05 DIAGNOSIS — M19011 Primary osteoarthritis, right shoulder: Secondary | ICD-10-CM | POA: Diagnosis not present

## 2019-08-05 NOTE — Telephone Encounter (Signed)
Spoke with patient's wife (DPR). She confirmed patient's compliance with Eliquis. Pt has not reported any worsened symptoms since his DCCV--SOB is at baseline, no palpitations or chest discomfort. Marvina aware to call back if patient develops any symptoms. No questions at this time. Per result note from 07/29/19 PPM transmission, Dr. Sallyanne Kuster is aware of persistent AF.

## 2019-08-06 ENCOUNTER — Telehealth: Payer: Self-pay | Admitting: Cardiovascular Disease

## 2019-08-06 NOTE — Telephone Encounter (Signed)
Sent to MD

## 2019-08-06 NOTE — Telephone Encounter (Signed)
It will not make his heart race.

## 2019-08-06 NOTE — Telephone Encounter (Signed)
Patient called & made aware of MD advice

## 2019-08-06 NOTE — Telephone Encounter (Signed)
New Message     Pt c/o medication issue:  1. Name of Medication: Alprazolam 0.25mg   2. How are you currently taking this medication (dosage and times per day)? n/a  3. Are you having a reaction (difficulty breathing--STAT)? no  4. What is your medication issue?  Patient wants to know if taking 2 Alprazolam's at night will make is heart race.  Please call patient to discuss.

## 2019-08-17 ENCOUNTER — Other Ambulatory Visit (HOSPITAL_COMMUNITY): Payer: Self-pay | Admitting: Hematology

## 2019-08-17 DIAGNOSIS — C9001 Multiple myeloma in remission: Secondary | ICD-10-CM

## 2019-08-18 DIAGNOSIS — R42 Dizziness and giddiness: Secondary | ICD-10-CM | POA: Diagnosis not present

## 2019-08-18 DIAGNOSIS — R519 Headache, unspecified: Secondary | ICD-10-CM | POA: Diagnosis not present

## 2019-08-18 DIAGNOSIS — R634 Abnormal weight loss: Secondary | ICD-10-CM | POA: Diagnosis not present

## 2019-08-24 ENCOUNTER — Encounter (HOSPITAL_COMMUNITY)
Admission: RE | Admit: 2019-08-24 | Discharge: 2019-08-24 | Disposition: A | Discharge status: Home / Self Care | Payer: Medicare Other | Source: Ambulatory Visit | Attending: Internal Medicine | Admitting: Internal Medicine

## 2019-08-24 ENCOUNTER — Encounter (HOSPITAL_COMMUNITY): Payer: Self-pay

## 2019-08-24 ENCOUNTER — Other Ambulatory Visit: Payer: Self-pay

## 2019-08-24 ENCOUNTER — Encounter (INDEPENDENT_AMBULATORY_CARE_PROVIDER_SITE_OTHER): Payer: Self-pay | Admitting: *Deleted

## 2019-08-24 DIAGNOSIS — R110 Nausea: Secondary | ICD-10-CM | POA: Diagnosis not present

## 2019-08-24 DIAGNOSIS — R197 Diarrhea, unspecified: Secondary | ICD-10-CM | POA: Diagnosis not present

## 2019-08-24 DIAGNOSIS — R109 Unspecified abdominal pain: Secondary | ICD-10-CM | POA: Diagnosis not present

## 2019-08-24 MED ORDER — TECHNETIUM TC 99M MEBROFENIN IV KIT
5.0000 | PACK | Freq: Once | INTRAVENOUS | Status: AC | PRN
  Administered 2019-08-24: 5.5 via INTRAVENOUS

## 2019-08-26 ENCOUNTER — Ambulatory Visit (HOSPITAL_COMMUNITY): Payer: Medicare Other

## 2019-08-27 ENCOUNTER — Other Ambulatory Visit: Payer: Self-pay | Admitting: Cardiovascular Disease

## 2019-09-06 ENCOUNTER — Other Ambulatory Visit: Payer: Self-pay

## 2019-09-06 ENCOUNTER — Other Ambulatory Visit (HOSPITAL_COMMUNITY): Payer: Self-pay

## 2019-09-06 DIAGNOSIS — E538 Deficiency of other specified B group vitamins: Secondary | ICD-10-CM

## 2019-09-06 DIAGNOSIS — C9001 Multiple myeloma in remission: Secondary | ICD-10-CM

## 2019-09-06 DIAGNOSIS — D801 Nonfamilial hypogammaglobulinemia: Secondary | ICD-10-CM

## 2019-09-07 ENCOUNTER — Inpatient Hospital Stay (HOSPITAL_COMMUNITY): Payer: Medicare Other

## 2019-09-07 ENCOUNTER — Encounter (HOSPITAL_COMMUNITY): Payer: Self-pay

## 2019-09-07 ENCOUNTER — Inpatient Hospital Stay (HOSPITAL_COMMUNITY): Payer: Medicare Other | Attending: Hematology

## 2019-09-07 VITALS — BP 125/89 | HR 86 | Temp 96.8°F | Resp 18 | Wt 174.6 lb

## 2019-09-07 DIAGNOSIS — D801 Nonfamilial hypogammaglobulinemia: Secondary | ICD-10-CM

## 2019-09-07 DIAGNOSIS — C9001 Multiple myeloma in remission: Secondary | ICD-10-CM

## 2019-09-07 DIAGNOSIS — E538 Deficiency of other specified B group vitamins: Secondary | ICD-10-CM

## 2019-09-07 LAB — COMPREHENSIVE METABOLIC PANEL
ALT: 10 U/L (ref 0–44)
AST: 15 U/L (ref 15–41)
Albumin: 3.4 g/dL — ABNORMAL LOW (ref 3.5–5.0)
Alkaline Phosphatase: 60 U/L (ref 38–126)
Anion gap: 6 (ref 5–15)
BUN: 8 mg/dL (ref 8–23)
CO2: 24 mmol/L (ref 22–32)
Calcium: 8.4 mg/dL — ABNORMAL LOW (ref 8.9–10.3)
Chloride: 111 mmol/L (ref 98–111)
Creatinine, Ser: 1.02 mg/dL (ref 0.61–1.24)
GFR calc Af Amer: >60 mL/min (ref >60)
GFR calc non Af Amer: >60 mL/min (ref >60)
Glucose, Bld: 108 mg/dL — ABNORMAL HIGH (ref 70–99)
Potassium: 4.1 mmol/L (ref 3.5–5.1)
Sodium: 141 mmol/L (ref 135–145)
Total Bilirubin: 0.6 mg/dL (ref 0.3–1.2)
Total Protein: 6.4 g/dL — ABNORMAL LOW (ref 6.5–8.1)

## 2019-09-07 LAB — CBC WITH DIFFERENTIAL/PLATELET
Abs Immature Granulocytes: 0.01 10*3/uL (ref 0.00–0.07)
Basophils Absolute: 0.1 10*3/uL (ref 0.0–0.1)
Basophils Relative: 2 %
Eosinophils Absolute: 0.1 10*3/uL (ref 0.0–0.5)
Eosinophils Relative: 3 %
HCT: 38 % — ABNORMAL LOW (ref 39.0–52.0)
Hemoglobin: 11.3 g/dL — ABNORMAL LOW (ref 13.0–17.0)
Immature Granulocytes: 0 %
Lymphocytes Relative: 51 %
Lymphs Abs: 1.9 10*3/uL (ref 0.7–4.0)
MCH: 28.1 pg (ref 26.0–34.0)
MCHC: 29.7 g/dL — ABNORMAL LOW (ref 30.0–36.0)
MCV: 94.5 fL (ref 80.0–100.0)
Monocytes Absolute: 0.5 10*3/uL (ref 0.1–1.0)
Monocytes Relative: 13 %
Neutro Abs: 1.2 10*3/uL — ABNORMAL LOW (ref 1.7–7.7)
Neutrophils Relative %: 31 %
Platelets: 92 10*3/uL — ABNORMAL LOW (ref 150–400)
RBC: 4.02 MIL/uL — ABNORMAL LOW (ref 4.22–5.81)
RDW: 16.4 % — ABNORMAL HIGH (ref 11.5–15.5)
WBC: 3.8 10*3/uL — ABNORMAL LOW (ref 4.0–10.5)
nRBC: 0 % (ref 0.0–0.2)

## 2019-09-07 MED ORDER — IMMUNE GLOBULIN (HUMAN) 10 GM/100ML IV SOLN
400.0000 mg/kg | Freq: Once | INTRAVENOUS | Status: AC
  Administered 2019-09-07: 30 g via INTRAVENOUS
  Filled 2019-09-07: qty 100

## 2019-09-07 MED ORDER — DEXTROSE 5 % IV SOLN
INTRAVENOUS | Status: DC
  Administered 2019-09-07: 09:00:00 via INTRAVENOUS

## 2019-09-07 MED ORDER — HEPARIN SOD (PORK) LOCK FLUSH 100 UNIT/ML IV SOLN
500.0000 [IU] | Freq: Once | INTRAVENOUS | Status: AC | PRN
  Administered 2019-09-07: 500 [IU]

## 2019-09-07 MED ORDER — SODIUM CHLORIDE 0.9% FLUSH
10.0000 mL | Freq: Once | INTRAVENOUS | Status: AC
  Administered 2019-09-07: 10 mL via INTRAVENOUS

## 2019-09-07 MED ORDER — ACETAMINOPHEN 325 MG PO TABS
650.0000 mg | ORAL_TABLET | Freq: Once | ORAL | Status: AC
  Administered 2019-09-07: 650 mg via ORAL
  Filled 2019-09-07: qty 2

## 2019-09-07 MED ORDER — SODIUM CHLORIDE 0.9 % IV SOLN
Freq: Once | INTRAVENOUS | Status: AC
  Administered 2019-09-07: 09:00:00 via INTRAVENOUS
  Filled 2019-09-07: qty 4

## 2019-09-07 NOTE — Progress Notes (Signed)
Patient tolerated IVIG with no complaints voiced.  Port site clean and dry with no bruising or swelling noted at site.  Good blood return noted before and after administration of therapy.  Band aid applied.  Patient left ambulatory with VSS and no s/s of distress noted.

## 2019-09-09 LAB — IGG, IGA, IGM
IgA: 480 mg/dL — ABNORMAL HIGH (ref 61–437)
IgG (Immunoglobin G), Serum: 934 mg/dL (ref 603–1613)
IgM (Immunoglobulin M), Srm: 31 mg/dL (ref 15–143)

## 2019-09-10 DIAGNOSIS — Z23 Encounter for immunization: Secondary | ICD-10-CM | POA: Diagnosis not present

## 2019-09-15 ENCOUNTER — Other Ambulatory Visit (HOSPITAL_COMMUNITY): Payer: Self-pay | Admitting: Hematology

## 2019-09-15 DIAGNOSIS — C9001 Multiple myeloma in remission: Secondary | ICD-10-CM

## 2019-09-20 ENCOUNTER — Ambulatory Visit (INDEPENDENT_AMBULATORY_CARE_PROVIDER_SITE_OTHER): Payer: Medicare Other | Admitting: Internal Medicine

## 2019-09-27 DIAGNOSIS — R519 Headache, unspecified: Secondary | ICD-10-CM | POA: Diagnosis not present

## 2019-09-27 DIAGNOSIS — F419 Anxiety disorder, unspecified: Secondary | ICD-10-CM | POA: Diagnosis not present

## 2019-09-28 ENCOUNTER — Telehealth: Payer: Self-pay | Admitting: *Deleted

## 2019-09-28 ENCOUNTER — Other Ambulatory Visit: Payer: Self-pay | Admitting: Internal Medicine

## 2019-09-28 ENCOUNTER — Other Ambulatory Visit (HOSPITAL_COMMUNITY): Payer: Self-pay | Admitting: Internal Medicine

## 2019-09-28 DIAGNOSIS — R519 Headache, unspecified: Secondary | ICD-10-CM

## 2019-09-28 DIAGNOSIS — H539 Unspecified visual disturbance: Secondary | ICD-10-CM

## 2019-09-29 DIAGNOSIS — Z85828 Personal history of other malignant neoplasm of skin: Secondary | ICD-10-CM | POA: Diagnosis not present

## 2019-09-29 DIAGNOSIS — L578 Other skin changes due to chronic exposure to nonionizing radiation: Secondary | ICD-10-CM | POA: Diagnosis not present

## 2019-09-29 DIAGNOSIS — D0462 Carcinoma in situ of skin of left upper limb, including shoulder: Secondary | ICD-10-CM | POA: Diagnosis not present

## 2019-09-29 DIAGNOSIS — D485 Neoplasm of uncertain behavior of skin: Secondary | ICD-10-CM | POA: Diagnosis not present

## 2019-09-29 DIAGNOSIS — L570 Actinic keratosis: Secondary | ICD-10-CM | POA: Diagnosis not present

## 2019-10-04 DIAGNOSIS — R197 Diarrhea, unspecified: Secondary | ICD-10-CM | POA: Diagnosis not present

## 2019-10-04 DIAGNOSIS — R110 Nausea: Secondary | ICD-10-CM | POA: Diagnosis not present

## 2019-10-05 ENCOUNTER — Ambulatory Visit (HOSPITAL_COMMUNITY)
Admission: RE | Admit: 2019-10-05 | Discharge: 2019-10-05 | Disposition: A | Discharge status: Home / Self Care | Payer: Medicare Other | Source: Ambulatory Visit | Attending: Internal Medicine | Admitting: Internal Medicine

## 2019-10-05 ENCOUNTER — Other Ambulatory Visit: Payer: Self-pay

## 2019-10-05 DIAGNOSIS — H539 Unspecified visual disturbance: Secondary | ICD-10-CM | POA: Insufficient documentation

## 2019-10-05 DIAGNOSIS — R519 Headache, unspecified: Secondary | ICD-10-CM | POA: Insufficient documentation

## 2019-10-06 ENCOUNTER — Other Ambulatory Visit: Payer: Self-pay

## 2019-10-12 DIAGNOSIS — D0462 Carcinoma in situ of skin of left upper limb, including shoulder: Secondary | ICD-10-CM | POA: Diagnosis not present

## 2019-10-14 ENCOUNTER — Inpatient Hospital Stay (HOSPITAL_COMMUNITY): Payer: Medicare Other | Attending: Hematology

## 2019-10-14 ENCOUNTER — Other Ambulatory Visit: Payer: Self-pay

## 2019-10-14 DIAGNOSIS — Z8572 Personal history of non-Hodgkin lymphomas: Secondary | ICD-10-CM | POA: Insufficient documentation

## 2019-10-14 DIAGNOSIS — D801 Nonfamilial hypogammaglobulinemia: Secondary | ICD-10-CM | POA: Insufficient documentation

## 2019-10-14 DIAGNOSIS — E538 Deficiency of other specified B group vitamins: Secondary | ICD-10-CM | POA: Diagnosis not present

## 2019-10-14 DIAGNOSIS — C9001 Multiple myeloma in remission: Secondary | ICD-10-CM | POA: Insufficient documentation

## 2019-10-14 LAB — CBC WITH DIFFERENTIAL/PLATELET
Abs Immature Granulocytes: 0.01 10*3/uL (ref 0.00–0.07)
Basophils Absolute: 0.1 10*3/uL (ref 0.0–0.1)
Basophils Relative: 2 %
Eosinophils Absolute: 0.1 10*3/uL (ref 0.0–0.5)
Eosinophils Relative: 2 %
HCT: 37.4 % — ABNORMAL LOW (ref 39.0–52.0)
Hemoglobin: 11.2 g/dL — ABNORMAL LOW (ref 13.0–17.0)
Immature Granulocytes: 0 %
Lymphocytes Relative: 49 %
Lymphs Abs: 2.5 10*3/uL (ref 0.7–4.0)
MCH: 27.1 pg (ref 26.0–34.0)
MCHC: 29.9 g/dL — ABNORMAL LOW (ref 30.0–36.0)
MCV: 90.3 fL (ref 80.0–100.0)
Monocytes Absolute: 0.5 10*3/uL (ref 0.1–1.0)
Monocytes Relative: 10 %
Neutro Abs: 1.9 10*3/uL (ref 1.7–7.7)
Neutrophils Relative %: 37 %
Platelets: 107 10*3/uL — ABNORMAL LOW (ref 150–400)
RBC: 4.14 MIL/uL — ABNORMAL LOW (ref 4.22–5.81)
RDW: 16.5 % — ABNORMAL HIGH (ref 11.5–15.5)
WBC: 5.1 10*3/uL (ref 4.0–10.5)
nRBC: 0 % (ref 0.0–0.2)

## 2019-10-14 LAB — COMPREHENSIVE METABOLIC PANEL
ALT: 8 U/L (ref 0–44)
AST: 13 U/L — ABNORMAL LOW (ref 15–41)
Albumin: 3.3 g/dL — ABNORMAL LOW (ref 3.5–5.0)
Alkaline Phosphatase: 65 U/L (ref 38–126)
Anion gap: 7 (ref 5–15)
BUN: 9 mg/dL (ref 8–23)
CO2: 27 mmol/L (ref 22–32)
Calcium: 8.3 mg/dL — ABNORMAL LOW (ref 8.9–10.3)
Chloride: 105 mmol/L (ref 98–111)
Creatinine, Ser: 1.06 mg/dL (ref 0.61–1.24)
GFR calc Af Amer: >60 mL/min (ref >60)
GFR calc non Af Amer: >60 mL/min (ref >60)
Glucose, Bld: 93 mg/dL (ref 70–99)
Potassium: 3.3 mmol/L — ABNORMAL LOW (ref 3.5–5.1)
Sodium: 139 mmol/L (ref 135–145)
Total Bilirubin: 0.7 mg/dL (ref 0.3–1.2)
Total Protein: 6.5 g/dL (ref 6.5–8.1)

## 2019-10-14 LAB — LACTATE DEHYDROGENASE: LDH: 127 U/L (ref 98–192)

## 2019-10-14 LAB — VITAMIN B12: Vitamin B-12: 206 pg/mL (ref 180–914)

## 2019-10-15 ENCOUNTER — Other Ambulatory Visit (HOSPITAL_COMMUNITY): Payer: Self-pay | Admitting: Nurse Practitioner

## 2019-10-15 DIAGNOSIS — E876 Hypokalemia: Secondary | ICD-10-CM

## 2019-10-15 LAB — KAPPA/LAMBDA LIGHT CHAINS
Kappa free light chain: 61.9 mg/L — ABNORMAL HIGH (ref 3.3–19.4)
Kappa, lambda light chain ratio: 1.4 (ref 0.26–1.65)
Lambda free light chains: 44.3 mg/L — ABNORMAL HIGH (ref 5.7–26.3)

## 2019-10-15 LAB — IGG, IGA, IGM
IgA: 532 mg/dL — ABNORMAL HIGH (ref 61–437)
IgG (Immunoglobin G), Serum: 963 mg/dL (ref 603–1613)
IgM (Immunoglobulin M), Srm: 38 mg/dL (ref 15–143)

## 2019-10-15 LAB — PROTEIN ELECTROPHORESIS, SERUM
A/G Ratio: 1.1 (ref 0.7–1.7)
Albumin ELP: 3.3 g/dL (ref 2.9–4.4)
Alpha-1-Globulin: 0.2 g/dL (ref 0.0–0.4)
Alpha-2-Globulin: 0.7 g/dL (ref 0.4–1.0)
Beta Globulin: 1.1 g/dL (ref 0.7–1.3)
Gamma Globulin: 0.9 g/dL (ref 0.4–1.8)
Globulin, Total: 2.9 g/dL (ref 2.2–3.9)
Total Protein ELP: 6.2 g/dL (ref 6.0–8.5)

## 2019-10-15 MED ORDER — POTASSIUM CHLORIDE CRYS ER 20 MEQ PO TBCR: 20.0000 meq | EXTENDED_RELEASE_TABLET | Freq: Three times a day (TID) | ORAL | 3 refills | Status: DC

## 2019-10-15 NOTE — Progress Notes (Signed)
Patient was called to inform him his K was low at 3.3. He reports only taking his potassium 2 times a day instead of three. He was informed to start taking it three times a day again. Also a new prescriptions was called in for him incase he needed it.

## 2019-10-19 ENCOUNTER — Ambulatory Visit (INDEPENDENT_AMBULATORY_CARE_PROVIDER_SITE_OTHER): Payer: Medicare Other | Admitting: Nurse Practitioner

## 2019-10-19 ENCOUNTER — Other Ambulatory Visit (HOSPITAL_COMMUNITY): Payer: Self-pay | Admitting: Nurse Practitioner

## 2019-10-19 DIAGNOSIS — C9001 Multiple myeloma in remission: Secondary | ICD-10-CM

## 2019-10-20 ENCOUNTER — Other Ambulatory Visit: Payer: Self-pay | Admitting: Cardiovascular Disease

## 2019-10-20 ENCOUNTER — Other Ambulatory Visit (HOSPITAL_COMMUNITY): Payer: Self-pay | Admitting: *Deleted

## 2019-10-20 ENCOUNTER — Inpatient Hospital Stay (HOSPITAL_BASED_OUTPATIENT_CLINIC_OR_DEPARTMENT_OTHER): Payer: Medicare Other | Admitting: Hematology

## 2019-10-20 ENCOUNTER — Other Ambulatory Visit: Payer: Self-pay

## 2019-10-20 ENCOUNTER — Inpatient Hospital Stay (HOSPITAL_COMMUNITY): Payer: Medicare Other

## 2019-10-20 VITALS — BP 110/67 | HR 93 | Temp 96.4°F | Resp 18 | Wt 171.6 lb

## 2019-10-20 VITALS — BP 92/67 | HR 60 | Temp 97.0°F | Resp 18

## 2019-10-20 DIAGNOSIS — E538 Deficiency of other specified B group vitamins: Secondary | ICD-10-CM

## 2019-10-20 DIAGNOSIS — D801 Nonfamilial hypogammaglobulinemia: Secondary | ICD-10-CM

## 2019-10-20 DIAGNOSIS — C9001 Multiple myeloma in remission: Secondary | ICD-10-CM

## 2019-10-20 MED ORDER — DIPHENHYDRAMINE HCL 25 MG PO CAPS
ORAL_CAPSULE | ORAL | Status: AC
  Filled 2019-10-20: qty 2

## 2019-10-20 MED ORDER — SODIUM CHLORIDE 0.9 % IJ SOLN: 10.0000 mL | INTRAMUSCULAR | Status: DC | PRN

## 2019-10-20 MED ORDER — HEPARIN SOD (PORK) LOCK FLUSH 100 UNIT/ML IV SOLN
500.0000 [IU] | Freq: Once | INTRAVENOUS | Status: AC | PRN
  Administered 2019-10-20: 500 [IU]

## 2019-10-20 MED ORDER — IMMUNE GLOBULIN (HUMAN) 10 GM/100ML IV SOLN
400.0000 mg/kg | Freq: Once | INTRAVENOUS | Status: AC
  Administered 2019-10-20: 30 g via INTRAVENOUS
  Filled 2019-10-20: qty 100

## 2019-10-20 MED ORDER — DEXTROSE 5 % IV SOLN
INTRAVENOUS | Status: DC
  Administered 2019-10-20: 09:00:00 via INTRAVENOUS

## 2019-10-20 MED ORDER — POTASSIUM CHLORIDE CRYS ER 20 MEQ PO TBCR
20.0000 meq | EXTENDED_RELEASE_TABLET | Freq: Once | ORAL | Status: AC
  Administered 2019-10-20: 20 meq via ORAL
  Filled 2019-10-20: qty 1

## 2019-10-20 MED ORDER — SODIUM CHLORIDE 0.9% FLUSH
10.0000 mL | Freq: Once | INTRAVENOUS | Status: AC
  Administered 2019-10-20: 09:00:00 10 mL via INTRAVENOUS

## 2019-10-20 MED ORDER — LENALIDOMIDE 10 MG PO CAPS: ORAL_CAPSULE | ORAL | 0 refills | Status: DC

## 2019-10-20 MED ORDER — ACETAMINOPHEN 325 MG PO TABS
650.0000 mg | ORAL_TABLET | Freq: Once | ORAL | Status: AC
  Administered 2019-10-20: 650 mg via ORAL

## 2019-10-20 MED ORDER — ACETAMINOPHEN 325 MG PO TABS
ORAL_TABLET | ORAL | Status: AC
  Filled 2019-10-20: qty 2

## 2019-10-20 MED ORDER — SODIUM CHLORIDE 0.9 % IV SOLN
Freq: Once | INTRAVENOUS | Status: AC
  Administered 2019-10-20: 10:00:00 via INTRAVENOUS
  Filled 2019-10-20: qty 4

## 2019-10-20 NOTE — Patient Instructions (Addendum)
[place] at [facility] Discharge Instructions  You were seen today by Dr. Delton Coombes. He went over your recent lab results. He will see you back in 12 weeks for labs, treatment and follow up.   Thank you for choosing [place] at [facility] to provide your oncology and hematology care.  To afford each patient quality time with our provider, please arrive at least 15 minutes before your scheduled appointment time.   If you have a lab appointment with the [facility] please come in thru the  Main Entrance and check in at the main information desk  You need to re-schedule your appointment should you arrive 10 or more minutes late.  We strive to give you quality time with our providers, and arriving late affects you and other patients whose appointments are after yours.  Also, if you no show three or more times for appointments you may be dismissed from the clinic at the providers discretion.     Again, thank you for choosing [facility].  Our hope is that these requests will decrease the amount of time that you wait before being seen by our physicians.       _____________________________________________________________  Should you have questions after your visit to [facility], please contact our office at [phone] between the hours of 8:00 a.m. and 4:30 p.m.  Voicemails left after 4:00 p.m. will not be returned until the following business day.  For prescription refill requests, have your pharmacy contact our office and allow 72 hours.    Cancer Center Support Programs:   > Cancer Support Group  2nd Tuesday of the month 1pm-2pm, Journey Room

## 2019-10-20 NOTE — Progress Notes (Signed)
Jeremy Parrish, [place] [id number]   CLINIC:  Medical Oncology/Hematology  PCP:  Celene Squibb, MD [place] [address] [phone]   REASON FOR VISIT: Follow-up for multiple myeloma and hypogammaglobulinemia  CURRENT THERAPY: Revlimid 7 days on 7 days off   INTERVAL HISTORY:  [name] 78 y.o. male seen for follow-up of multiple myeloma, hypogammaglobulinemia and history of marginal zone lymphoma.  Appetite  is 100%.  Energy levels are 50%.  Denies any fevers, night sweats or weight loss.  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  It is well controlled with Imodium.  Has some sleep problems.  Reported pain slightly lateral to the port site twice in the last 2 months.  Reportedly pain improved after taking acid reflux medication.   REVIEW OF SYSTEMS:  Review of Systems  Gastrointestinal: Positive for diarrhea.  Neurological: Positive for numbness.  Psychiatric/Behavioral: Positive for sleep disturbance.  All other systems reviewed and are negative.    PAST MEDICAL/SURGICAL HISTORY:  Past Medical History:  Diagnosis Date  . Anemia   . Aortic aneurysm of unspecified site without mention of rupture   . Arthritis   . Bladder neck contracture   . Cancer ([place])   . Cerebral atherosclerosis    Carotid Doppler, 02/16/2013 - Bilateral Proximal ICAs,demonstrate mild plaque w/o evidence of significant diameter reduction, dissection, or any other vascular abnormality  . CHF (congestive heart failure) ([place])   . Complication of anesthesia   . COPD (chronic obstructive pulmonary disease) ([place])   . Coronary artery disease   . Depression   . Esophageal reflux   . Heart disease   . Heart murmur   . Hx of bladder cancer 10/07/2012  . Hyperlipidemia   . Hypertension   . 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 Intestinovesical fistula   . Kidney stones    history  . Lung mass   . Multiple myeloma   . Myocardial infarction [facility])    '96  . Non Hodgkin's lymphoma ([place])   . Paroxysmal atrial fibrillation ([place]) 01/02/2016  . Peripheral arterial disease ([place])   . Personal history of other diseases of circulatory system   . PONV (postoperative nausea and vomiting)   . Prostate cancer ([place]) 2000  . Shingles   . Shortness of breath   . Sleep apnea    05-02-14 cpap , not yet used- suggested settings 5  . Stroke [facility]) 2013   Speech.   Past Surgical History:  Procedure Laterality Date  . BLADDER SURGERY    . BONE MARROW TRANSPLANT  2011  . CARDIOVERSION N/A 06/02/2019   Procedure: CARDIOVERSION;  Surgeon: Sanda Klein, MD;  Location: [facility] ENDOSCOPY;  Service: Cardiovascular;  Laterality: N/A;  . COLON SURGERY     colon resection  . COLONOSCOPY N/A 01/01/2013   Procedure: COLONOSCOPY;  Surgeon: Rogene Houston, MD;  Location: [facility] ENDO SUITE;  Service: Endoscopy;  Laterality: N/A;  825-moved to [place] notified pt  . COLONOSCOPY N/A 07/16/2018   Procedure: COLONOSCOPY;  Surgeon: Rogene Houston, MD;  Location: [facility] ENDO SUITE;  Service: Endoscopy;  Laterality: N/A;  1:25  . CORONARY ANGIOPLASTY  06/24/2000   PCI and stenting in mid & proximal RCA  . heart stents x 5  1999  . INGUINAL HERNIA REPAIR Right 05/04/2014   Procedure: OPEN RIGHT INGUINAL HERNIA REPAIR with mesh;  Surgeon: Edward Jolly,  MD;  Location: [facility] ORS;  Service: General;  Laterality: Right;  . INSERT / REPLACE / REMOVE PACEMAKER    . left ear skin cancer removed    . NM MYOCAR PERF WALL MOTION  11/27/2007   inferior scar  . PACEMAKER INSERTION  07/22/2011   [employer]  . POLYPECTOMY  07/16/2018   Procedure: POLYPECTOMY;  Surgeon: Rogene Houston, MD;  Location: [facility] ENDO SUITE;  Service: Endoscopy;;  colon  . PORTACATH PLACEMENT  07/26/2009   right chest  . PROSTATE SURGERY    . Rotator    . ROTATOR CUFF REPAIR Right   . SHOULDER ARTHROSCOPY WITH SUBACROMIAL DECOMPRESSION Right  07/21/2013   Procedure: RIGHT SHOULDER ARTHROSCOPY WITH SUBACROMIAL DECOMPRESSION AND DEBRIDEMENT & Injection of Left Shoulder;  Surgeon: Alta Corning, MD;  Location: [facility];  Service: Orthopedics;  Laterality: Right;  . TEE WITHOUT CARDIOVERSION  10/13/2012   Procedure: TRANSESOPHAGEAL ECHOCARDIOGRAM (TEE);  Surgeon: Sanda Klein, MD;  Location: [facility] ENDOSCOPY;  Service: Cardiovascular;  Laterality: N/A;  pat/kay/echo notified  . US ECHOCARDIOGRAPHY  06/19/2011   RV mildly dilated,mild to mod. MR,mild AI,mild PI  . WRIST SURGERY     right     SOCIAL HISTORY:  Social History   Socioeconomic History  . Marital status: Married    Spouse name: [name]  . Number of children: 3  . Years of education: 9th  . Highest education level: Not on file  Occupational History  . Occupation: retired   Tobacco Use  . Smoking status: Former Smoker    Packs/day: 1.00    Years: 20.00    Pack years: 20.00    Types: Cigarettes    Quit date: 11/14/1994    Years since quitting: 24.9  . Smokeless tobacco: Never Used  Substance and Sexual Activity  . Alcohol use: No    Alcohol/week: 0.0 standard drinks    Comment: previously drank but none for at least 15 years.  . Drug use: No  . Sexual activity: Not on file  Other Topics Concern  . Not on file  Social History Narrative   Patient lives at home spouse.   Caffeine Use: Occasionally   Social Determinants of Health   Financial Resource Strain:   . Difficulty of Paying Living Expenses: Not on file  Food Insecurity:   . Worried About Charity fundraiser in the Last Year: Not on file  . Ran Out of Food in the Last Year: Not on file  Transportation Needs:   . Lack of Transportation (Medical): Not on file  . Lack of Transportation (Non-Medical): Not on file  Physical Activity:   . Days of Exercise per Week: Not on file  . Minutes of Exercise per Session: Not on file  Stress:   . Feeling of Stress : Not on file  Social Connections:   . Frequency of  Communication with Friends and Family: Not on file  . Frequency of Social Gatherings with Friends and Family: Not on file  . Attends Religious Services: Not on file  . Active Member of Clubs or Organizations: Not on file  . Attends Archivist Meetings: Not on file  . Marital Status: Not on file  Intimate Partner Violence:   . Fear of Current or Ex-Partner: Not on file  . Emotionally Abused: Not on file  . Physically Abused: Not on file  . Sexually Abused: Not on file    FAMILY HISTORY:  Family History  Problem Relation Age of Onset  .  Cancer Father        bladder  . Heart disease Father        before age 13  . Hypertension Mother   . Cancer Brother   . Heart disease Brother        before age 4  . Heart disease Sister        before age 68  . Hyperlipidemia Sister   . Hypertension Sister   . Heart attack Sister   . Colon cancer Neg Hx   . Colon polyps Neg Hx     CURRENT MEDICATIONS:  Outpatient Encounter Medications as of 10/20/2019  Medication Sig  . amLODipine (NORVASC) 5 MG tablet Take 5 mg by mouth daily.  . clopidogrel (PLAVIX) 75 MG tablet TAKE 1 TABLET BY MOUTH EVERY DAY  . diltiazem (CARDIZEM CD) 120 MG 24 hr capsule Take 1 capsule (120 mg total) by mouth daily.  Marland Kitchen levocetirizine (XYZAL) 5 MG tablet Take 5 mg by mouth every evening.   Marland Kitchen octreotide (SANDOSTATIN LAR DEPOT) 10 MG injection Inject 10 mg into the muscle every 28 (twenty-eight) days.  Marland Kitchen omeprazole (PRILOSEC) 20 MG capsule Take 20 mg by mouth daily.  . potassium chloride SA (KLOR-CON M20) 20 MEQ tablet Take 1 tablet (20 mEq total) by mouth 3 (three) times daily.  . simvastatin (ZOCOR) 20 MG tablet TAKE 1 TABLET BY MOUTH EVERYDAY AT BEDTIME (Patient taking differently: Take 40 mg by mouth at bedtime. )  . [DISCONTINUED] ELIQUIS 5 MG TABS tablet TAKE 1 TABLET BY MOUTH TWICE A DAY  . [DISCONTINUED] lenalidomide (REVLIMID) 10 MG capsule TAKE 1 CAPSULE BY MOUTH  DAILY FOR 7 DAYS ON THEN 7  DAYS OFF  THEN REPEAT CYCLE  . albuterol (PROVENTIL HFA;VENTOLIN HFA) 108 (90 Base) MCG/ACT inhaler Inhale 2 puffs into the lungs every 6 (six) hours as needed for wheezing or shortness of breath. (Patient not taking: Reported on 10/20/2019)  . albuterol (PROVENTIL) (2.5 MG/3ML) 0.083% nebulizer solution Take 2.5 mg by nebulization every 4 (four) hours as needed for wheezing or shortness of breath.   . ALPRAZolam (XANAX) 0.25 MG tablet Take 0.25 mg by mouth as needed.   . furosemide (LASIX) 20 MG tablet Use sparingly as needed for edema. No more than one tablet by mouth per week. (Patient not taking: Reported on 10/20/2019)  . HYDROcodone-acetaminophen (NORCO/VICODIN) 5-325 MG tablet Take 1 tablet by mouth 2 (two) times daily as needed. for pain  . ipratropium-albuterol (DUONEB) 0.5-2.5 (3) MG/3ML SOLN Take 3 mLs by nebulization every 6 (six) hours as needed (shortness of breath).   . methocarbamol (ROBAXIN) 750 MG tablet Take 750 mg by mouth every 8 (eight) hours as needed for muscle spasms.   . mometasone (NASONEX) 50 MCG/ACT nasal spray Place 2 sprays into the nose daily as needed (allergies).   . nitroGLYCERIN (NITROSTAT) 0.4 MG SL tablet PLACE 1 TABLET UNDER THE TONGUE EVERY 5 MINUTES AS NEEDED FOR CHEST PAIN. (Patient not taking: Reported on 10/20/2019)  . ondansetron (ZOFRAN) 4 MG tablet Take 4 mg by mouth every 8 (eight) hours as needed for nausea or vomiting.   . [DISCONTINUED] HYDROcodone-acetaminophen (NORCO) 7.5-325 MG tablet Take 1 tablet by mouth every 6 (six) hours as needed.   . [DISCONTINUED] loperamide (IMODIUM) 2 MG capsule Take 2 capsules (4 mg total) by mouth 2 (two) times daily as needed for diarrhea or loose stools. (Patient not taking: Reported on 10/20/2019)   Facility-Administered Encounter Medications as of 10/20/2019  Medication  .  acetaminophen (TYLENOL) tablet 650 mg  . sodium chloride 0.9 % injection 10 mL  . sodium chloride flush (NS) 0.9 % injection 3 mL    ALLERGIES:   Allergies  Allergen Reactions  . Morphine And Related Other (See Comments)    hallucinations  . Tape Rash    Paper tape is ok     PHYSICAL EXAM:  ECOG Performance status: 1  Vitals:   10/20/19 0832  BP: 110/67  Pulse: 93  Resp: 18  Temp: (!) 96.4 F (35.8 C)  SpO2: 99%   Filed Weights   10/20/19 0832  Weight: 171 lb 9.6 oz (77.8 kg)    Physical Exam Constitutional:      Appearance: He is well-developed.  Cardiovascular:     Rate and Rhythm: Normal rate and regular rhythm.     Heart sounds: Normal heart sounds.  Pulmonary:     Effort: Pulmonary effort is normal.     Breath sounds: Normal breath sounds.  Abdominal:     General: There is no distension.     Palpations: Abdomen is soft. There is no mass.  Musculoskeletal: Normal range of motion.  Lymphadenopathy:     Cervical: No cervical adenopathy.  Skin:    General: Skin is warm.  Neurological:     Mental Status: He is alert and oriented to person, place, and time.  Psychiatric:        Behavior: Behavior normal.        Thought Content: Thought content normal.        Judgment: Judgment normal.      LABORATORY DATA:  I have reviewed the labs as listed.  CBC    Component Value Date/Time   WBC 5.1 10/14/2019 1227   RBC 4.14 (L) 10/14/2019 1227   HGB 11.2 (L) 10/14/2019 1227   HGB 11.2 (L) 05/31/2019 1020   HCT 37.4 (L) 10/14/2019 1227   HCT 34.7 (L) 05/31/2019 1020   PLT 107 (L) 10/14/2019 1227   PLT 123 (L) 05/31/2019 1020   MCV 90.3 10/14/2019 1227   MCV 89 05/31/2019 1020   MCH 27.1 10/14/2019 1227   MCHC 29.9 (L) 10/14/2019 1227   RDW 16.5 (H) 10/14/2019 1227   RDW 15.3 05/31/2019 1020   LYMPHSABS 2.5 10/14/2019 1227   MONOABS 0.5 10/14/2019 1227   EOSABS 0.1 10/14/2019 1227   BASOSABS 0.1 10/14/2019 1227   CMP Latest Ref Rng & Units 10/14/2019 09/07/2019 07/26/2019  Glucose 70 - 99 mg/dL 93 108(H) 131(H)  BUN 8 - 23 mg/dL '9 8 8  '$ Creatinine 0.61 - 1.24 mg/dL 1.06 1.02 1.07  Sodium 135 -  145 mmol/L 139 141 136  Potassium 3.5 - 5.1 mmol/L 3.3(L) 4.1 3.3(L)  Chloride 98 - 111 mmol/L 105 111 107  CO2 22 - 32 mmol/L '27 24 22  '$ Calcium 8.9 - 10.3 mg/dL 8.3(L) 8.4(L) 8.5(L)  Total Protein 6.5 - 8.1 g/dL 6.5 6.4(L) 6.5  Total Bilirubin 0.3 - 1.2 mg/dL 0.7 0.6 0.7  Alkaline Phos 38 - 126 U/L 65 60 55  AST 15 - 41 U/L 13(L) 15 17  ALT 0 - 44 U/L '8 10 11    '$ I have independently reviewed the scans and discussed with the patient.     ASSESSMENT & PLAN:   Multiple myeloma in remission 1.  IgG lambda multiple myeloma: -Status post auto stem cell transplant at [facility] on 08/25/2019 -Revlimid maintenance 5 mg 7 days on/7 days off since 2011, well-tolerated except occasional diarrhea -  Myeloma labs from 10/14/2019 reviewed by me shows SPEP with no M spike.  Kappa light chains have improved to 61.9, previously 80.2.  Light chain ratio improved to 1.4, previously 1.6.  Lambda light chains were 44.3, previously 50. -He is continuing to tolerate Revlimid maintenance.  He has chronic diarrhea which is controlled with Imodium.  2.  Hypogammaglobulinemia: - He is receiving IVIG at 400 mg/kg every 6 weeks. -He does not report any recurrent infections.  3.  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  Physical exam today did not reveal any adenopathy or splenomegaly.  4.  Sleep problems: - He has used Xanax 0.25 mg at bedtime which helped partially.  I have told him to increase it to 0.5 mg at bedtime.  If it helps we will give a prescription for 0.5 mg.  5.  Vitamin B12 deficiency: -He has been receiving monthly B12 injections for years.  We will discontinue it and check his B12 level next visit.  6.  Port pain: -He reported pain in the port area twice in the last couple of months. -Apparently the pain got better after taking acid reflux pill. -Port site is within normal limits with no erythema or sign of infection.  It is nontender to palpation.     Orders placed this encounter:  Orders Placed This Encounter  Procedures  . CBC with Differential/Platelet  . Comprehensive metabolic panel  . CBC with Differential/Platelet  . Comprehensive metabolic panel  . Protein electrophoresis, serum  . Kappa/lambda light chains  . Lactate dehydrogenase  . Immunofixation electrophoresis      Derek Jack, MD [place] [phone]

## 2019-10-20 NOTE — Progress Notes (Signed)
Labs reviewed today with MD. Proceed with treatment as planned.   Treatment given per orders. Patient tolerated it well without problems. Vitals stable and discharged home from clinic ambulatory. Follow up as scheduled.

## 2019-10-21 ENCOUNTER — Other Ambulatory Visit (HOSPITAL_COMMUNITY): Payer: Self-pay | Admitting: *Deleted

## 2019-10-22 ENCOUNTER — Other Ambulatory Visit (INDEPENDENT_AMBULATORY_CARE_PROVIDER_SITE_OTHER): Payer: Self-pay | Admitting: Nurse Practitioner

## 2019-10-22 DIAGNOSIS — R197 Diarrhea, unspecified: Secondary | ICD-10-CM

## 2019-10-23 ENCOUNTER — Encounter (HOSPITAL_COMMUNITY): Payer: Self-pay | Admitting: Hematology

## 2019-10-23 NOTE — Assessment & Plan Note (Signed)
1.  IgG lambda multiple myeloma: -Status post auto stem cell transplant at [facility] on 08/25/2019 -Revlimid maintenance 5 mg 7 days on/7 days off since 2011, well-tolerated except occasional diarrhea -Myeloma labs from 10/14/2019 reviewed by me shows SPEP with no M spike.  Kappa light chains have improved to 61.9, previously 80.2.  Light chain ratio improved to 1.4, previously 1.6.  Lambda light chains were 44.3, previously 50. -He is continuing to tolerate Revlimid maintenance.  He has chronic diarrhea which is controlled with Imodium.  2.  Hypogammaglobulinemia: - He is receiving IVIG at 400 mg/kg every 6 weeks. -He does not report any recurrent infections.  3.  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  Physical exam today did not reveal any adenopathy or splenomegaly.  4.  Sleep problems: - He has used Xanax 0.25 mg at bedtime which helped partially.  I have told him to increase it to 0.5 mg at bedtime.  If it helps we will give a prescription for 0.5 mg.  5.  Vitamin B12 deficiency: -He has been receiving monthly B12 injections for years.  We will discontinue it and check his B12 level next visit.  6.  Port pain: -He reported pain in the port area twice in the last couple of months. -Apparently the pain got better after taking acid reflux pill. -Port site is within normal limits with no erythema or sign of infection.  It is nontender to palpation.

## 2019-10-27 ENCOUNTER — Ambulatory Visit (INDEPENDENT_AMBULATORY_CARE_PROVIDER_SITE_OTHER): Payer: Medicare Other | Admitting: *Deleted

## 2019-10-27 DIAGNOSIS — Z950 Presence of cardiac pacemaker: Secondary | ICD-10-CM | POA: Diagnosis not present

## 2019-10-27 LAB — CUP PACEART REMOTE DEVICE CHECK
Battery Voltage: 2.88 V
Brady Statistic AP VP Percent: 9.95 %
Brady Statistic AP VS Percent: 2.93 %
Brady Statistic AS VP Percent: 46.73 %
Brady Statistic AS VS Percent: 40.4 %
Brady Statistic RA Percent Paced: 10.74 %
Brady Statistic RV Percent Paced: 54.11 %
Date Time Interrogation Session: 20201216103934
Implantable Lead Implant Date: 20120910
Implantable Lead Implant Date: 20120910
Implantable Lead Location: 753859
Implantable Lead Location: 753860
Implantable Pulse Generator Implant Date: 20120910
Lead Channel Impedance Value: 368 Ohm
Lead Channel Impedance Value: 384 Ohm
Lead Channel Sensing Intrinsic Amplitude: 0.843 mV
Lead Channel Setting Pacing Amplitude: 2 V
Lead Channel Setting Pacing Amplitude: 2.5 V
Lead Channel Setting Pacing Pulse Width: 0.6 ms
Lead Channel Setting Sensing Sensitivity: 0.9 mV

## 2019-10-28 ENCOUNTER — Encounter (INDEPENDENT_AMBULATORY_CARE_PROVIDER_SITE_OTHER): Payer: Self-pay | Admitting: *Deleted

## 2019-10-28 ENCOUNTER — Ambulatory Visit (INDEPENDENT_AMBULATORY_CARE_PROVIDER_SITE_OTHER): Payer: Medicare Other | Admitting: Nurse Practitioner

## 2019-10-28 ENCOUNTER — Other Ambulatory Visit: Payer: Self-pay

## 2019-10-28 ENCOUNTER — Encounter (INDEPENDENT_AMBULATORY_CARE_PROVIDER_SITE_OTHER): Payer: Self-pay | Admitting: Nurse Practitioner

## 2019-10-28 DIAGNOSIS — R1084 Generalized abdominal pain: Secondary | ICD-10-CM

## 2019-10-28 DIAGNOSIS — R197 Diarrhea, unspecified: Secondary | ICD-10-CM

## 2019-10-28 NOTE — Progress Notes (Signed)
Subjective:    Patient ID: Jeremy Parrish, male    DOB: Oct 04, 1941, 78 y.o.   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  He pesents today for follow-up regarding chronic diarrhea presumed to be related to Revlimid.  He was started on Sandostatin LAR Depo 10 mg IM every 28 days x 2 doses.  He stated his PCP stopped it because his diarrhea did not improve.  I am not sure that he truly failed Sandostatin LAR depot as the dose potentially could be increased to 30 mg IM q. 28 days.  He passes diarrhea within 1 to 2 hours after eating.  His appetite has decreased. He eats only 1 meal daily. He complains of generalized abdominal discomfort that is fairly constant.  He has intermittent nausea.  No vomiting. He is taking loperamide 2 mg 2 tabs p.o. twice daily.  Has lost 7 pounds which is concerning.  No fever, sweats or chills.   His most recent colonoscopy was 07/16/2018 which identified a 50m tubular adenomatous polyp at the hepatic flexure, 3 small tubular adenomatous polyps at the sigmoid and transverse colon, diverticulosis to the proximal sigmoid colon, patent end to end colonic anastomosis and external hemorrhoids  CBC Latest Ref Rng & Units 10/14/2019 09/07/2019 07/26/2019  WBC 4.0 - 10.5 K/uL 5.1 3.8(L) 3.7(L)  Hemoglobin 13.0 - 17.0 g/dL 11.2(L) 11.3(L) 10.9(L)  Hematocrit 39.0 - 52.0 % 37.4(L) 38.0(L) 35.1(L)  Platelets 150 - 400 K/uL  107(L) 92(L) 123(L)   CMP Latest Ref Rng & Units 10/14/2019 09/07/2019 07/26/2019  Glucose 70 - 99 mg/dL 93 108(H) 131(H)  BUN 8 - 23 mg/dL '9 8 8  '$ Creatinine 0.61 - 1.24 mg/dL 1.06 1.02 1.07  Sodium 135 - 145 mmol/L 139 141 136  Potassium 3.5 - 5.1 mmol/L 3.3(L) 4.1 3.3(L)  Chloride 98 - 111 mmol/L 105 111 107  CO2 22 - 32 mmol/L '27 24 22  '$ Calcium 8.9 - 10.3 mg/dL 8.3(L) 8.4(L) 8.5(L)  Total Protein 6.5 - 8.1 g/dL 6.5 6.4(L) 6.5  Total Bilirubin 0.3 - 1.2 mg/dL 0.7 0.6 0.7  Alkaline Phos 38 - 126 U/L 65 60 55  AST 15 - 41 U/L 13(L) 15 17  ALT 0 - 44 U/L '8 10 11    '$ Current Outpatient Medications on File Prior to Visit  Medication Sig Dispense Refill  . albuterol (PROVENTIL HFA;VENTOLIN HFA) 108 (90 Base) MCG/ACT inhaler Inhale 2 puffs into the lungs every 6 (six) hours as needed for wheezing or shortness of breath. 1 Inhaler 3  . albuterol (PROVENTIL) (2.5 MG/3ML) 0.083% nebulizer solution Take 2.5 mg by nebulization every 4 (four) hours as needed for wheezing or shortness of breath.     . ALPRAZolam (XANAX) 0.25 MG tablet Take 0.25 mg by mouth as needed.     .Marland KitchenamLODipine (NORVASC) 5 MG tablet Take 5 mg by mouth daily.    . clopidogrel (PLAVIX) 75 MG tablet TAKE 1 TABLET BY MOUTH EVERY DAY 90 tablet 2  . diltiazem (CARDIZEM CD) 120  MG 24 hr capsule Take 1 capsule (120 mg total) by mouth daily. 30 capsule 6  . ELIQUIS 5 MG TABS tablet TAKE 1 TABLET BY MOUTH TWICE A DAY 180 tablet 2  . furosemide (LASIX) 20 MG tablet Use sparingly as needed for edema. No more than one tablet by mouth per week. 21 tablet 0  . HYDROcodone-acetaminophen (NORCO/VICODIN) 5-325 MG tablet Take 1 tablet by mouth 2 (two) times daily as needed. for pain  0  . ipratropium-albuterol (DUONEB) 0.5-2.5 (3) MG/3ML SOLN Take 3 mLs by nebulization every 6 (six) hours as needed (shortness of breath).     Marland Kitchen lenalidomide (REVLIMID) 10 MG capsule TAKE 1 CAPSULE BY MOUTH  DAILY FOR 7 DAYS ON THEN 7  DAYS OFF THEN REPEAT CYCLE 14  capsule 0  . levocetirizine (XYZAL) 5 MG tablet Take 5 mg by mouth every evening.     . methocarbamol (ROBAXIN) 750 MG tablet Take 750 mg by mouth every 8 (eight) hours as needed for muscle spasms.     . mometasone (NASONEX) 50 MCG/ACT nasal spray Place 2 sprays into the nose daily as needed (allergies).   1  . nitroGLYCERIN (NITROSTAT) 0.4 MG SL tablet PLACE 1 TABLET UNDER THE TONGUE EVERY 5 MINUTES AS NEEDED FOR CHEST PAIN. 25 tablet 4  . octreotide (SANDOSTATIN LAR DEPOT) 10 MG injection Inject 10 mg into the muscle every 28 (twenty-eight) days. 1 each 0  . omeprazole (PRILOSEC) 20 MG capsule Take 20 mg by mouth daily.    . ondansetron (ZOFRAN) 4 MG tablet Take 4 mg by mouth every 8 (eight) hours as needed for nausea or vomiting.   1  . potassium chloride SA (KLOR-CON M20) 20 MEQ tablet Take 1 tablet (20 mEq total) by mouth 3 (three) times daily. 270 tablet 3  . simvastatin (ZOCOR) 20 MG tablet TAKE 1 TABLET BY MOUTH EVERYDAY AT BEDTIME (Patient taking differently: Take 40 mg by mouth at bedtime. ) 90 tablet 1  . loperamide (IMODIUM) 2 MG capsule TAKE 2 CAPSULES BY MOUTH 2 TIMES DAILY AS NEEDED FOR DIARRHEA OR LOOSE STOOLS. (Patient not taking: Reported on 10/28/2019) 90 capsule 0   Current Facility-Administered Medications on File Prior to Visit  Medication Dose Route Frequency Provider Last Rate Last Admin  . acetaminophen (TYLENOL) tablet 650 mg  650 mg Oral Q6H PRN Baird Cancer, PA-C   650 mg at 12/09/14 0855  . sodium chloride 0.9 % injection 10 mL  10 mL Intracatheter PRN Baird Cancer, PA-C   10 mL at 12/09/14 0856  . sodium chloride flush (NS) 0.9 % injection 3 mL  3 mL Intravenous Q12H Croitoru, Mihai, MD       Allergies  Allergen Reactions  . Morphine And Related Other (See Comments)    hallucinations  . Tape Rash    Paper tape is ok    Review of Systems the HPI, all other systems reviewed and are negative     Objective:   Physical Exam  BP 109/76 (BP Location:  Right Arm, Patient Position: Sitting, Cuff Size: Normal)   Pulse 82   Temp (!) 97.2 F (36.2 C) (Oral)   Ht '5\' 8"'$  (1.727 m)   Wt 169 lb 6.4 oz (76.8 kg)   BMI 25.76 kg/m  General: 78 year old male in no acute distress Eyes: Sclera nonicteric, conjunctiva pink Neck: Supple Heart: Irregular rhythm, no murmurs Lungs: Breath sounds clear throughout Abdomen: Soft, tenderness at the umbilicus and throughout the lower abdomen >  to the central lower abdomen  Remedies: No edema Neuro: Alert and oriented x 4, expressive aphasia      Assessment & Plan:   1.  Chronic diarrhea -Check pancreatic elastase -Consider higher dose of Sandostatin LAR depot i.e. 30 mg IM q. 28 days -Florastor probiotic 1 p.o. twice daily -Loperamine '2mg'$  two tabs po bid. No Pepto as he is on Eliquis.   2. Central abdominal pain -Abdominal/pelvic CAT scan with oral and IV contrast  2. Weight loss -See plan in #2 -Consider ttrial of Mirtazapine, would appreciate Dr. Gentry Fitz input regarding this medication to stimulate the patient's appetite and to increase his weight  3.  History of multiple myeloma and hypergammaglobulinemia Revlimid and IVIG  4. History of  Colon polyps -Next colonoscopy due 07/2023  5. Afib on Eliquis

## 2019-10-28 NOTE — Patient Instructions (Signed)
1.  Florastor probiotic 1 capsule by mouth twice daily  2.  Continue loperamide 2 tablets by mouth twice daily  3.  I will consult with Dr. Dorien Chihuahua regarding further diarrhea treatment  4.  Proceed with an abdominal/pelvic CAT scan due to having abdominal pain and weight loss

## 2019-11-01 ENCOUNTER — Other Ambulatory Visit (HOSPITAL_COMMUNITY): Payer: Self-pay | Admitting: Nurse Practitioner

## 2019-11-01 DIAGNOSIS — C9001 Multiple myeloma in remission: Secondary | ICD-10-CM

## 2019-11-09 LAB — PANCREATIC ELASTASE, FECAL: Pancreatic Elastase-1, Stool: >500 mcg/g

## 2019-11-17 ENCOUNTER — Other Ambulatory Visit (HOSPITAL_COMMUNITY): Payer: Self-pay | Admitting: Nurse Practitioner

## 2019-11-17 DIAGNOSIS — C9001 Multiple myeloma in remission: Secondary | ICD-10-CM

## 2019-11-17 NOTE — Progress Notes (Signed)
PPM Remote  

## 2019-11-18 ENCOUNTER — Ambulatory Visit (HOSPITAL_COMMUNITY)
Admission: RE | Admit: 2019-11-18 | Discharge: 2019-11-18 | Disposition: A | Discharge status: Home / Self Care | Payer: Medicare Other | Source: Ambulatory Visit | Attending: Nurse Practitioner | Admitting: Nurse Practitioner

## 2019-11-18 ENCOUNTER — Other Ambulatory Visit: Payer: Self-pay

## 2019-11-18 DIAGNOSIS — R1084 Generalized abdominal pain: Secondary | ICD-10-CM | POA: Insufficient documentation

## 2019-11-18 MED ORDER — IOHEXOL 300 MG/ML  SOLN
100.0000 mL | Freq: Once | INTRAMUSCULAR | Status: AC | PRN
  Administered 2019-11-18: 100 mL via INTRAVENOUS

## 2019-11-19 ENCOUNTER — Other Ambulatory Visit (HOSPITAL_COMMUNITY): Payer: Self-pay | Admitting: *Deleted

## 2019-11-19 DIAGNOSIS — C9001 Multiple myeloma in remission: Secondary | ICD-10-CM

## 2019-11-19 MED ORDER — LENALIDOMIDE 10 MG PO CAPS: ORAL_CAPSULE | ORAL | 0 refills | Status: DC

## 2019-11-23 DIAGNOSIS — Z23 Encounter for immunization: Secondary | ICD-10-CM | POA: Diagnosis not present

## 2019-11-24 ENCOUNTER — Other Ambulatory Visit: Payer: Self-pay | Admitting: Cardiovascular Disease

## 2019-11-24 DIAGNOSIS — E785 Hyperlipidemia, unspecified: Secondary | ICD-10-CM

## 2019-11-29 DIAGNOSIS — G4700 Insomnia, unspecified: Secondary | ICD-10-CM | POA: Diagnosis not present

## 2019-11-29 DIAGNOSIS — F321 Major depressive disorder, single episode, moderate: Secondary | ICD-10-CM | POA: Diagnosis not present

## 2019-11-30 ENCOUNTER — Other Ambulatory Visit (HOSPITAL_COMMUNITY): Payer: Self-pay | Admitting: *Deleted

## 2019-11-30 DIAGNOSIS — E538 Deficiency of other specified B group vitamins: Secondary | ICD-10-CM

## 2019-11-30 DIAGNOSIS — D801 Nonfamilial hypogammaglobulinemia: Secondary | ICD-10-CM

## 2019-11-30 DIAGNOSIS — C9001 Multiple myeloma in remission: Secondary | ICD-10-CM

## 2019-12-01 ENCOUNTER — Other Ambulatory Visit: Payer: Self-pay

## 2019-12-01 ENCOUNTER — Inpatient Hospital Stay (HOSPITAL_COMMUNITY): Payer: Medicare Other | Attending: Hematology

## 2019-12-01 ENCOUNTER — Inpatient Hospital Stay (HOSPITAL_COMMUNITY): Payer: Medicare Other

## 2019-12-01 VITALS — BP 141/80 | HR 96 | Temp 97.1°F | Resp 18 | Wt 167.8 lb

## 2019-12-01 DIAGNOSIS — E538 Deficiency of other specified B group vitamins: Secondary | ICD-10-CM

## 2019-12-01 DIAGNOSIS — D801 Nonfamilial hypogammaglobulinemia: Secondary | ICD-10-CM | POA: Diagnosis not present

## 2019-12-01 DIAGNOSIS — C9001 Multiple myeloma in remission: Secondary | ICD-10-CM | POA: Diagnosis not present

## 2019-12-01 LAB — CBC WITH DIFFERENTIAL/PLATELET
Abs Immature Granulocytes: 0.01 10*3/uL (ref 0.00–0.07)
Basophils Absolute: 0.1 10*3/uL (ref 0.0–0.1)
Basophils Relative: 2 %
Eosinophils Absolute: 0.1 10*3/uL (ref 0.0–0.5)
Eosinophils Relative: 2 %
HCT: 38.2 % — ABNORMAL LOW (ref 39.0–52.0)
Hemoglobin: 11.7 g/dL — ABNORMAL LOW (ref 13.0–17.0)
Immature Granulocytes: 0 %
Lymphocytes Relative: 46 %
Lymphs Abs: 2 10*3/uL (ref 0.7–4.0)
MCH: 27.7 pg (ref 26.0–34.0)
MCHC: 30.6 g/dL (ref 30.0–36.0)
MCV: 90.3 fL (ref 80.0–100.0)
Monocytes Absolute: 0.4 10*3/uL (ref 0.1–1.0)
Monocytes Relative: 10 %
Neutro Abs: 1.7 10*3/uL (ref 1.7–7.7)
Neutrophils Relative %: 40 %
Platelets: 112 10*3/uL — ABNORMAL LOW (ref 150–400)
RBC: 4.23 MIL/uL (ref 4.22–5.81)
RDW: 17.5 % — ABNORMAL HIGH (ref 11.5–15.5)
WBC: 4.4 10*3/uL (ref 4.0–10.5)
nRBC: 0 % (ref 0.0–0.2)

## 2019-12-01 LAB — COMPREHENSIVE METABOLIC PANEL
ALT: 12 U/L (ref 0–44)
AST: 15 U/L (ref 15–41)
Albumin: 3.5 g/dL (ref 3.5–5.0)
Alkaline Phosphatase: 82 U/L (ref 38–126)
Anion gap: 11 (ref 5–15)
BUN: 14 mg/dL (ref 8–23)
CO2: 26 mmol/L (ref 22–32)
Calcium: 9.7 mg/dL (ref 8.9–10.3)
Chloride: 106 mmol/L (ref 98–111)
Creatinine, Ser: 1.09 mg/dL (ref 0.61–1.24)
GFR calc Af Amer: >60 mL/min (ref >60)
GFR calc non Af Amer: >60 mL/min (ref >60)
Glucose, Bld: 104 mg/dL — ABNORMAL HIGH (ref 70–99)
Potassium: 3.4 mmol/L — ABNORMAL LOW (ref 3.5–5.1)
Sodium: 143 mmol/L (ref 135–145)
Total Bilirubin: 0.7 mg/dL (ref 0.3–1.2)
Total Protein: 7 g/dL (ref 6.5–8.1)

## 2019-12-01 MED ORDER — SODIUM CHLORIDE 0.9 % IV SOLN
Freq: Once | INTRAVENOUS | Status: AC
  Filled 2019-12-01: qty 4

## 2019-12-01 MED ORDER — DEXTROSE 5 % IV SOLN: INTRAVENOUS | Status: DC

## 2019-12-01 MED ORDER — SODIUM CHLORIDE 0.9% FLUSH
10.0000 mL | Freq: Once | INTRAVENOUS | Status: AC
  Administered 2019-12-01: 09:00:00 10 mL via INTRAVENOUS

## 2019-12-01 MED ORDER — IMMUNE GLOBULIN (HUMAN) 10 GM/100ML IV SOLN
400.0000 mg/kg | Freq: Once | INTRAVENOUS | Status: AC
  Administered 2019-12-01: 30 g via INTRAVENOUS
  Filled 2019-12-01: qty 300

## 2019-12-01 MED ORDER — ACETAMINOPHEN 325 MG PO TABS
650.0000 mg | ORAL_TABLET | Freq: Once | ORAL | Status: AC
  Administered 2019-12-01: 650 mg via ORAL

## 2019-12-01 MED ORDER — SODIUM CHLORIDE 0.9 % IJ SOLN: 10.0000 mL | INTRAMUSCULAR | Status: DC | PRN

## 2019-12-01 MED ORDER — ACETAMINOPHEN 325 MG PO TABS
ORAL_TABLET | ORAL | Status: AC
  Filled 2019-12-01: qty 2

## 2019-12-01 MED ORDER — HEPARIN SOD (PORK) LOCK FLUSH 100 UNIT/ML IV SOLN
500.0000 [IU] | Freq: Once | INTRAVENOUS | Status: AC | PRN
  Administered 2019-12-01: 500 [IU]

## 2019-12-01 NOTE — Progress Notes (Signed)
Patient presents today for IVIG. MAR reviewed and updated. Patient has no complaints of any changes since the last visit.   IVIG given today per MD orders. Tolerated infusion without adverse affects. Vital signs stable. No complaints at this time. Discharged from clinic via wheel chair. F/U with [facility] as scheduled.

## 2019-12-01 NOTE — Patient Instructions (Signed)
[facility] at [facility]  Discharge Instructions:   _______________________________________________________________  Thank you for choosing [facility] at [facility] to provide your oncology and hematology care.  To afford each patient quality time with our providers, please arrive at least 15 minutes before your scheduled appointment.  You need to re-schedule your appointment if you arrive 10 or more minutes late.  We strive to give you quality time with our providers, and arriving late affects you and other patients whose appointments are after yours.  Also, if you no show three or more times for appointments you may be dismissed from the clinic.  Again, thank you for choosing [facility] at [facility]. Our hope is that these requests will allow you access to exceptional care and in a timely manner. _______________________________________________________________  If you have questions after your visit, please contact our office at [phone] between the hours of 8:30 a.m. and 5:00 p.m. Voicemails left after 4:30 p.m. will not be returned until the following business day. _______________________________________________________________  For prescription refill requests, have your pharmacy contact our office. _______________________________________________________________  Recommendations made by the consultant and any test results will be sent to your referring physician. _______________________________________________________________ 

## 2019-12-02 LAB — IGG, IGA, IGM
IgA: 545 mg/dL — ABNORMAL HIGH (ref 61–437)
IgG (Immunoglobin G), Serum: 1033 mg/dL (ref 603–1613)
IgM (Immunoglobulin M), Srm: 38 mg/dL (ref 15–143)

## 2019-12-06 ENCOUNTER — Other Ambulatory Visit: Payer: Self-pay

## 2019-12-06 ENCOUNTER — Other Ambulatory Visit (HOSPITAL_COMMUNITY): Payer: Self-pay | Admitting: Adult Health Nurse Practitioner

## 2019-12-06 ENCOUNTER — Ambulatory Visit (INDEPENDENT_AMBULATORY_CARE_PROVIDER_SITE_OTHER): Payer: Medicare Other | Admitting: Gastroenterology

## 2019-12-06 ENCOUNTER — Ambulatory Visit (HOSPITAL_COMMUNITY)
Admission: RE | Admit: 2019-12-06 | Discharge: 2019-12-06 | Disposition: A | Discharge status: Home / Self Care | Payer: Medicare Other | Source: Ambulatory Visit | Attending: Adult Health Nurse Practitioner | Admitting: Adult Health Nurse Practitioner

## 2019-12-06 DIAGNOSIS — Z20822 Contact with and (suspected) exposure to covid-19: Secondary | ICD-10-CM | POA: Diagnosis not present

## 2019-12-06 DIAGNOSIS — R0602 Shortness of breath: Secondary | ICD-10-CM

## 2019-12-06 DIAGNOSIS — Z515 Encounter for palliative care: Secondary | ICD-10-CM | POA: Diagnosis not present

## 2019-12-06 DIAGNOSIS — I4891 Unspecified atrial fibrillation: Secondary | ICD-10-CM | POA: Diagnosis not present

## 2019-12-06 DIAGNOSIS — R570 Cardiogenic shock: Secondary | ICD-10-CM | POA: Diagnosis not present

## 2019-12-06 DIAGNOSIS — J156 Pneumonia due to other aerobic Gram-negative bacteria: Secondary | ICD-10-CM | POA: Diagnosis not present

## 2019-12-06 DIAGNOSIS — A4159 Other Gram-negative sepsis: Secondary | ICD-10-CM | POA: Diagnosis not present

## 2019-12-06 DIAGNOSIS — R0600 Dyspnea, unspecified: Secondary | ICD-10-CM | POA: Diagnosis not present

## 2019-12-06 DIAGNOSIS — Z66 Do not resuscitate: Secondary | ICD-10-CM | POA: Diagnosis not present

## 2019-12-08 ENCOUNTER — Telehealth: Payer: Self-pay | Admitting: Physician Assistant

## 2019-12-08 ENCOUNTER — Telehealth: Payer: Self-pay | Admitting: Cardiovascular Disease

## 2019-12-08 NOTE — Telephone Encounter (Signed)
Pt c/o of Chest Pain: STAT if CP now or developed within 24 hours  1. Are you having CP right now? no  2. Are you experiencing any other symptoms (ex. SOB, nausea, vomiting, sweating)? When he have chest pains- he have shortness of breath  3. How long have you been experiencing CP? Started last Friday(12-03-19)  4. Is your CP continuous or coming and going?  Coming and going   5. Have you taken Nitroglycerin?  Yes- pt wanted an appt - made appt for 12-13-19 with Roby Lofts- Please call to evaluate?

## 2019-12-08 NOTE — Telephone Encounter (Signed)
LM for pts wife, [name] on [place], to call back.

## 2019-12-08 NOTE — Telephone Encounter (Signed)
Follow up  ° ° °Patients wife is returning call.  °

## 2019-12-08 NOTE — Telephone Encounter (Signed)
Spoke with pt's wife and pt has a list of complaints SOB (just walking around the house ) , no appetite,not sleeping,diarrhea, feet swollen ,and over the last couple of months has taken several ntg. Appt made with Fabian Sharp PA for tomorrow at 8:00 and instructed if gets worsening S/S to go to ED for eval and tx .

## 2019-12-08 NOTE — Telephone Encounter (Signed)
Left detailed message on pt wife number--OK per DPR--informing pt that in the event we have a delayed opening tomorrow morning due to winter weather, Angie would like the pt to come in at 10:30 AM. If we do not have a delay, I will call the pt tomorrow morning before 8 to let him know. Asked pt to call our office back to let us know if he is able to do that.

## 2019-12-09 ENCOUNTER — Inpatient Hospital Stay: Payer: Self-pay

## 2019-12-09 ENCOUNTER — Inpatient Hospital Stay (HOSPITAL_COMMUNITY): Payer: Medicare Other

## 2019-12-09 ENCOUNTER — Other Ambulatory Visit: Payer: Self-pay

## 2019-12-09 ENCOUNTER — Inpatient Hospital Stay (HOSPITAL_COMMUNITY)
Admission: EM | Admit: 2019-12-09 | Discharge: 2019-12-27 | DRG: 870 | Disposition: A | Discharge status: Hospice | Payer: Medicare Other | Attending: Internal Medicine | Admitting: Internal Medicine

## 2019-12-09 ENCOUNTER — Ambulatory Visit: Payer: Medicare Other | Admitting: Physician Assistant

## 2019-12-09 ENCOUNTER — Inpatient Hospital Stay (HOSPITAL_COMMUNITY): Payer: Medicare Other | Admitting: Anesthesiology

## 2019-12-09 ENCOUNTER — Other Ambulatory Visit (HOSPITAL_COMMUNITY): Payer: Self-pay

## 2019-12-09 ENCOUNTER — Encounter (HOSPITAL_COMMUNITY): Payer: Self-pay | Admitting: Emergency Medicine

## 2019-12-09 ENCOUNTER — Emergency Department (HOSPITAL_COMMUNITY): Payer: Medicare Other

## 2019-12-09 DIAGNOSIS — I248 Other forms of acute ischemic heart disease: Secondary | ICD-10-CM | POA: Diagnosis present

## 2019-12-09 DIAGNOSIS — E785 Hyperlipidemia, unspecified: Secondary | ICD-10-CM | POA: Diagnosis not present

## 2019-12-09 DIAGNOSIS — N183 Chronic kidney disease, stage 3 unspecified: Secondary | ICD-10-CM | POA: Diagnosis not present

## 2019-12-09 DIAGNOSIS — J156 Pneumonia due to other aerobic Gram-negative bacteria: Secondary | ICD-10-CM | POA: Diagnosis present

## 2019-12-09 DIAGNOSIS — Z452 Encounter for adjustment and management of vascular access device: Secondary | ICD-10-CM | POA: Diagnosis not present

## 2019-12-09 DIAGNOSIS — L89151 Pressure ulcer of sacral region, stage 1: Secondary | ICD-10-CM | POA: Diagnosis not present

## 2019-12-09 DIAGNOSIS — R6521 Severe sepsis with septic shock: Secondary | ICD-10-CM | POA: Diagnosis present

## 2019-12-09 DIAGNOSIS — Z7189 Other specified counseling: Secondary | ICD-10-CM

## 2019-12-09 DIAGNOSIS — I6310 Cerebral infarction due to embolism of unspecified precerebral artery: Secondary | ICD-10-CM

## 2019-12-09 DIAGNOSIS — D6959 Other secondary thrombocytopenia: Secondary | ICD-10-CM | POA: Diagnosis present

## 2019-12-09 DIAGNOSIS — E1122 Type 2 diabetes mellitus with diabetic chronic kidney disease: Secondary | ICD-10-CM | POA: Diagnosis present

## 2019-12-09 DIAGNOSIS — I4819 Other persistent atrial fibrillation: Secondary | ICD-10-CM | POA: Diagnosis present

## 2019-12-09 DIAGNOSIS — R570 Cardiogenic shock: Secondary | ICD-10-CM | POA: Diagnosis not present

## 2019-12-09 DIAGNOSIS — J9600 Acute respiratory failure, unspecified whether with hypoxia or hypercapnia: Secondary | ICD-10-CM

## 2019-12-09 DIAGNOSIS — G4733 Obstructive sleep apnea (adult) (pediatric): Secondary | ICD-10-CM | POA: Diagnosis present

## 2019-12-09 DIAGNOSIS — M1990 Unspecified osteoarthritis, unspecified site: Secondary | ICD-10-CM | POA: Diagnosis present

## 2019-12-09 DIAGNOSIS — Z85828 Personal history of other malignant neoplasm of skin: Secondary | ICD-10-CM

## 2019-12-09 DIAGNOSIS — Z8679 Personal history of other diseases of the circulatory system: Secondary | ICD-10-CM

## 2019-12-09 DIAGNOSIS — E870 Hyperosmolality and hypernatremia: Secondary | ICD-10-CM | POA: Diagnosis not present

## 2019-12-09 DIAGNOSIS — F411 Generalized anxiety disorder: Secondary | ICD-10-CM | POA: Diagnosis not present

## 2019-12-09 DIAGNOSIS — D801 Nonfamilial hypogammaglobulinemia: Secondary | ICD-10-CM | POA: Diagnosis present

## 2019-12-09 DIAGNOSIS — Z9911 Dependence on respirator [ventilator] status: Secondary | ICD-10-CM | POA: Diagnosis not present

## 2019-12-09 DIAGNOSIS — Z66 Do not resuscitate: Secondary | ICD-10-CM | POA: Diagnosis not present

## 2019-12-09 DIAGNOSIS — K219 Gastro-esophageal reflux disease without esophagitis: Secondary | ICD-10-CM | POA: Diagnosis present

## 2019-12-09 DIAGNOSIS — I25118 Atherosclerotic heart disease of native coronary artery with other forms of angina pectoris: Secondary | ICD-10-CM | POA: Diagnosis not present

## 2019-12-09 DIAGNOSIS — Z9109 Other allergy status, other than to drugs and biological substances: Secondary | ICD-10-CM

## 2019-12-09 DIAGNOSIS — J9690 Respiratory failure, unspecified, unspecified whether with hypoxia or hypercapnia: Secondary | ICD-10-CM

## 2019-12-09 DIAGNOSIS — N1832 Chronic kidney disease, stage 3b: Secondary | ICD-10-CM | POA: Diagnosis present

## 2019-12-09 DIAGNOSIS — I340 Nonrheumatic mitral (valve) insufficiency: Secondary | ICD-10-CM | POA: Diagnosis not present

## 2019-12-09 DIAGNOSIS — E874 Mixed disorder of acid-base balance: Secondary | ICD-10-CM | POA: Diagnosis not present

## 2019-12-09 DIAGNOSIS — Z96649 Presence of unspecified artificial hip joint: Secondary | ICD-10-CM | POA: Diagnosis present

## 2019-12-09 DIAGNOSIS — N170 Acute kidney failure with tubular necrosis: Secondary | ICD-10-CM | POA: Diagnosis not present

## 2019-12-09 DIAGNOSIS — Z4659 Encounter for fitting and adjustment of other gastrointestinal appliance and device: Secondary | ICD-10-CM

## 2019-12-09 DIAGNOSIS — R0600 Dyspnea, unspecified: Secondary | ICD-10-CM | POA: Diagnosis not present

## 2019-12-09 DIAGNOSIS — Z20822 Contact with and (suspected) exposure to covid-19: Secondary | ICD-10-CM | POA: Diagnosis present

## 2019-12-09 DIAGNOSIS — R52 Pain, unspecified: Secondary | ICD-10-CM | POA: Diagnosis not present

## 2019-12-09 DIAGNOSIS — I5023 Acute on chronic systolic (congestive) heart failure: Secondary | ICD-10-CM | POA: Diagnosis present

## 2019-12-09 DIAGNOSIS — Z9481 Bone marrow transplant status: Secondary | ICD-10-CM | POA: Diagnosis not present

## 2019-12-09 DIAGNOSIS — Z8249 Family history of ischemic heart disease and other diseases of the circulatory system: Secondary | ICD-10-CM

## 2019-12-09 DIAGNOSIS — I69320 Aphasia following cerebral infarction: Secondary | ICD-10-CM

## 2019-12-09 DIAGNOSIS — Z955 Presence of coronary angioplasty implant and graft: Secondary | ICD-10-CM

## 2019-12-09 DIAGNOSIS — K529 Noninfective gastroenteritis and colitis, unspecified: Secondary | ICD-10-CM | POA: Diagnosis present

## 2019-12-09 DIAGNOSIS — L89301 Pressure ulcer of unspecified buttock, stage 1: Secondary | ICD-10-CM | POA: Diagnosis not present

## 2019-12-09 DIAGNOSIS — J449 Chronic obstructive pulmonary disease, unspecified: Secondary | ICD-10-CM | POA: Diagnosis not present

## 2019-12-09 DIAGNOSIS — Z7902 Long term (current) use of antithrombotics/antiplatelets: Secondary | ICD-10-CM

## 2019-12-09 DIAGNOSIS — R0602 Shortness of breath: Secondary | ICD-10-CM

## 2019-12-09 DIAGNOSIS — R011 Cardiac murmur, unspecified: Secondary | ICD-10-CM | POA: Diagnosis present

## 2019-12-09 DIAGNOSIS — I480 Paroxysmal atrial fibrillation: Secondary | ICD-10-CM | POA: Diagnosis not present

## 2019-12-09 DIAGNOSIS — I4891 Unspecified atrial fibrillation: Secondary | ICD-10-CM

## 2019-12-09 DIAGNOSIS — I361 Nonrheumatic tricuspid (valve) insufficiency: Secondary | ICD-10-CM

## 2019-12-09 DIAGNOSIS — Z950 Presence of cardiac pacemaker: Secondary | ICD-10-CM | POA: Diagnosis not present

## 2019-12-09 DIAGNOSIS — I429 Cardiomyopathy, unspecified: Secondary | ICD-10-CM | POA: Diagnosis present

## 2019-12-09 DIAGNOSIS — Z87891 Personal history of nicotine dependence: Secondary | ICD-10-CM

## 2019-12-09 DIAGNOSIS — A4159 Other Gram-negative sepsis: Principal | ICD-10-CM | POA: Diagnosis present

## 2019-12-09 DIAGNOSIS — R0603 Acute respiratory distress: Secondary | ICD-10-CM | POA: Diagnosis not present

## 2019-12-09 DIAGNOSIS — I10 Essential (primary) hypertension: Secondary | ICD-10-CM | POA: Diagnosis not present

## 2019-12-09 DIAGNOSIS — Z515 Encounter for palliative care: Secondary | ICD-10-CM | POA: Diagnosis present

## 2019-12-09 DIAGNOSIS — F0390 Unspecified dementia without behavioral disturbance: Secondary | ICD-10-CM | POA: Diagnosis present

## 2019-12-09 DIAGNOSIS — F329 Major depressive disorder, single episode, unspecified: Secondary | ICD-10-CM | POA: Diagnosis present

## 2019-12-09 DIAGNOSIS — I495 Sick sinus syndrome: Secondary | ICD-10-CM | POA: Diagnosis present

## 2019-12-09 DIAGNOSIS — C9001 Multiple myeloma in remission: Secondary | ICD-10-CM | POA: Diagnosis present

## 2019-12-09 DIAGNOSIS — N281 Cyst of kidney, acquired: Secondary | ICD-10-CM | POA: Diagnosis not present

## 2019-12-09 DIAGNOSIS — E46 Unspecified protein-calorie malnutrition: Secondary | ICD-10-CM | POA: Diagnosis present

## 2019-12-09 DIAGNOSIS — I2510 Atherosclerotic heart disease of native coronary artery without angina pectoris: Secondary | ICD-10-CM | POA: Diagnosis not present

## 2019-12-09 DIAGNOSIS — D849 Immunodeficiency, unspecified: Secondary | ICD-10-CM | POA: Diagnosis present

## 2019-12-09 DIAGNOSIS — J9601 Acute respiratory failure with hypoxia: Secondary | ICD-10-CM | POA: Diagnosis not present

## 2019-12-09 DIAGNOSIS — Z8572 Personal history of non-Hodgkin lymphomas: Secondary | ICD-10-CM

## 2019-12-09 DIAGNOSIS — N179 Acute kidney failure, unspecified: Secondary | ICD-10-CM

## 2019-12-09 DIAGNOSIS — Z978 Presence of other specified devices: Secondary | ICD-10-CM

## 2019-12-09 DIAGNOSIS — R7303 Prediabetes: Secondary | ICD-10-CM | POA: Diagnosis present

## 2019-12-09 DIAGNOSIS — E1151 Type 2 diabetes mellitus with diabetic peripheral angiopathy without gangrene: Secondary | ICD-10-CM | POA: Diagnosis present

## 2019-12-09 DIAGNOSIS — E8770 Fluid overload, unspecified: Secondary | ICD-10-CM | POA: Diagnosis not present

## 2019-12-09 DIAGNOSIS — R579 Shock, unspecified: Secondary | ICD-10-CM

## 2019-12-09 DIAGNOSIS — I472 Ventricular tachycardia: Secondary | ICD-10-CM

## 2019-12-09 DIAGNOSIS — I130 Hypertensive heart and chronic kidney disease with heart failure and stage 1 through stage 4 chronic kidney disease, or unspecified chronic kidney disease: Secondary | ICD-10-CM | POA: Diagnosis present

## 2019-12-09 DIAGNOSIS — K7200 Acute and subacute hepatic failure without coma: Secondary | ICD-10-CM | POA: Diagnosis present

## 2019-12-09 DIAGNOSIS — J9811 Atelectasis: Secondary | ICD-10-CM | POA: Diagnosis present

## 2019-12-09 DIAGNOSIS — Z7901 Long term (current) use of anticoagulants: Secondary | ICD-10-CM

## 2019-12-09 DIAGNOSIS — E876 Hypokalemia: Secondary | ICD-10-CM | POA: Diagnosis present

## 2019-12-09 DIAGNOSIS — R4182 Altered mental status, unspecified: Secondary | ICD-10-CM | POA: Diagnosis not present

## 2019-12-09 DIAGNOSIS — Z8551 Personal history of malignant neoplasm of bladder: Secondary | ICD-10-CM

## 2019-12-09 DIAGNOSIS — Z885 Allergy status to narcotic agent status: Secondary | ICD-10-CM

## 2019-12-09 DIAGNOSIS — I088 Other rheumatic multiple valve diseases: Secondary | ICD-10-CM | POA: Diagnosis present

## 2019-12-09 DIAGNOSIS — I719 Aortic aneurysm of unspecified site, without rupture: Secondary | ICD-10-CM | POA: Diagnosis present

## 2019-12-09 DIAGNOSIS — R32 Unspecified urinary incontinence: Secondary | ICD-10-CM | POA: Diagnosis present

## 2019-12-09 DIAGNOSIS — Z4682 Encounter for fitting and adjustment of non-vascular catheter: Secondary | ICD-10-CM | POA: Diagnosis not present

## 2019-12-09 DIAGNOSIS — M2550 Pain in unspecified joint: Secondary | ICD-10-CM | POA: Diagnosis not present

## 2019-12-09 DIAGNOSIS — Z79899 Other long term (current) drug therapy: Secondary | ICD-10-CM

## 2019-12-09 DIAGNOSIS — J441 Chronic obstructive pulmonary disease with (acute) exacerbation: Secondary | ICD-10-CM | POA: Diagnosis not present

## 2019-12-09 DIAGNOSIS — I471 Supraventricular tachycardia: Secondary | ICD-10-CM | POA: Diagnosis present

## 2019-12-09 DIAGNOSIS — G9341 Metabolic encephalopathy: Secondary | ICD-10-CM | POA: Diagnosis present

## 2019-12-09 DIAGNOSIS — L8990 Pressure ulcer of unspecified site, unspecified stage: Secondary | ICD-10-CM | POA: Insufficient documentation

## 2019-12-09 DIAGNOSIS — Z8546 Personal history of malignant neoplasm of prostate: Secondary | ICD-10-CM

## 2019-12-09 DIAGNOSIS — Z79890 Hormone replacement therapy (postmenopausal): Secondary | ICD-10-CM

## 2019-12-09 DIAGNOSIS — Z8619 Personal history of other infectious and parasitic diseases: Secondary | ICD-10-CM

## 2019-12-09 DIAGNOSIS — E875 Hyperkalemia: Secondary | ICD-10-CM | POA: Diagnosis present

## 2019-12-09 DIAGNOSIS — Z6827 Body mass index (BMI) 27.0-27.9, adult: Secondary | ICD-10-CM

## 2019-12-09 DIAGNOSIS — E1165 Type 2 diabetes mellitus with hyperglycemia: Secondary | ICD-10-CM | POA: Diagnosis present

## 2019-12-09 DIAGNOSIS — N189 Chronic kidney disease, unspecified: Secondary | ICD-10-CM | POA: Diagnosis not present

## 2019-12-09 DIAGNOSIS — I6340 Cerebral infarction due to embolism of unspecified cerebral artery: Secondary | ICD-10-CM | POA: Diagnosis present

## 2019-12-09 DIAGNOSIS — G8191 Hemiplegia, unspecified affecting right dominant side: Secondary | ICD-10-CM | POA: Diagnosis present

## 2019-12-09 DIAGNOSIS — Z87442 Personal history of urinary calculi: Secondary | ICD-10-CM

## 2019-12-09 DIAGNOSIS — I509 Heart failure, unspecified: Secondary | ICD-10-CM

## 2019-12-09 DIAGNOSIS — I252 Old myocardial infarction: Secondary | ICD-10-CM

## 2019-12-09 DIAGNOSIS — Z01818 Encounter for other preprocedural examination: Secondary | ICD-10-CM

## 2019-12-09 DIAGNOSIS — J440 Chronic obstructive pulmonary disease with acute lower respiratory infection: Secondary | ICD-10-CM | POA: Diagnosis present

## 2019-12-09 DIAGNOSIS — I519 Heart disease, unspecified: Secondary | ICD-10-CM | POA: Diagnosis not present

## 2019-12-09 DIAGNOSIS — Z7401 Bed confinement status: Secondary | ICD-10-CM | POA: Diagnosis not present

## 2019-12-09 DIAGNOSIS — I5021 Acute systolic (congestive) heart failure: Secondary | ICD-10-CM | POA: Diagnosis not present

## 2019-12-09 DIAGNOSIS — Z8349 Family history of other endocrine, nutritional and metabolic diseases: Secondary | ICD-10-CM

## 2019-12-09 DIAGNOSIS — I672 Cerebral atherosclerosis: Secondary | ICD-10-CM | POA: Diagnosis present

## 2019-12-09 LAB — CBC WITH DIFFERENTIAL/PLATELET
Abs Immature Granulocytes: 0.03 10*3/uL (ref 0.00–0.07)
Basophils Absolute: 0 10*3/uL (ref 0.0–0.1)
Basophils Relative: 0 %
Eosinophils Absolute: 0 10*3/uL (ref 0.0–0.5)
Eosinophils Relative: 0 %
HCT: 39.4 % (ref 39.0–52.0)
Hemoglobin: 11.9 g/dL — ABNORMAL LOW (ref 13.0–17.0)
Immature Granulocytes: 0 %
Lymphocytes Relative: 18 %
Lymphs Abs: 1.3 10*3/uL (ref 0.7–4.0)
MCH: 27.6 pg (ref 26.0–34.0)
MCHC: 30.2 g/dL (ref 30.0–36.0)
MCV: 91.4 fL (ref 80.0–100.0)
Monocytes Absolute: 0.6 10*3/uL (ref 0.1–1.0)
Monocytes Relative: 8 %
Neutro Abs: 5.2 10*3/uL (ref 1.7–7.7)
Neutrophils Relative %: 74 %
Platelets: 133 10*3/uL — ABNORMAL LOW (ref 150–400)
RBC: 4.31 MIL/uL (ref 4.22–5.81)
RDW: 18.7 % — ABNORMAL HIGH (ref 11.5–15.5)
WBC: 7.2 10*3/uL (ref 4.0–10.5)
nRBC: 0.3 % — ABNORMAL HIGH (ref 0.0–0.2)

## 2019-12-09 LAB — COMPREHENSIVE METABOLIC PANEL
ALT: 70 U/L — ABNORMAL HIGH (ref 0–44)
ALT: 694 U/L — ABNORMAL HIGH (ref 0–44)
AST: 78 U/L — ABNORMAL HIGH (ref 15–41)
AST: 796 U/L — ABNORMAL HIGH (ref 15–41)
Albumin: 3.8 g/dL (ref 3.5–5.0)
Albumin: 3.3 g/dL — ABNORMAL LOW (ref 3.5–5.0)
Alkaline Phosphatase: 83 U/L (ref 38–126)
Alkaline Phosphatase: 75 U/L (ref 38–126)
Anion gap: 11 (ref 5–15)
Anion gap: 25 — ABNORMAL HIGH (ref 5–15)
BUN: 36 mg/dL — ABNORMAL HIGH (ref 8–23)
BUN: 40 mg/dL — ABNORMAL HIGH (ref 8–23)
CO2: 22 mmol/L (ref 22–32)
CO2: 10 mmol/L — ABNORMAL LOW (ref 22–32)
Calcium: 9.6 mg/dL (ref 8.9–10.3)
Calcium: 9.2 mg/dL (ref 8.9–10.3)
Chloride: 102 mmol/L (ref 98–111)
Chloride: 101 mmol/L (ref 98–111)
Creatinine, Ser: 1.35 mg/dL — ABNORMAL HIGH (ref 0.61–1.24)
Creatinine, Ser: 2.14 mg/dL — ABNORMAL HIGH (ref 0.61–1.24)
GFR calc Af Amer: 58 mL/min — ABNORMAL LOW (ref >60)
GFR calc Af Amer: 33 mL/min — ABNORMAL LOW (ref >60)
GFR calc non Af Amer: 50 mL/min — ABNORMAL LOW (ref >60)
GFR calc non Af Amer: 29 mL/min — ABNORMAL LOW (ref >60)
Glucose, Bld: 128 mg/dL — ABNORMAL HIGH (ref 70–99)
Glucose, Bld: 115 mg/dL — ABNORMAL HIGH (ref 70–99)
Potassium: 4.4 mmol/L (ref 3.5–5.1)
Potassium: 5.8 mmol/L — ABNORMAL HIGH (ref 3.5–5.1)
Sodium: 135 mmol/L (ref 135–145)
Sodium: 136 mmol/L (ref 135–145)
Total Bilirubin: 0.9 mg/dL (ref 0.3–1.2)
Total Bilirubin: 2.2 mg/dL — ABNORMAL HIGH (ref 0.3–1.2)
Total Protein: 7.6 g/dL (ref 6.5–8.1)
Total Protein: 7 g/dL (ref 6.5–8.1)

## 2019-12-09 LAB — BASIC METABOLIC PANEL
Anion gap: 14 (ref 5–15)
BUN: 38 mg/dL — ABNORMAL HIGH (ref 8–23)
CO2: 19 mmol/L — ABNORMAL LOW (ref 22–32)
Calcium: 9.3 mg/dL (ref 8.9–10.3)
Chloride: 101 mmol/L (ref 98–111)
Creatinine, Ser: 1.48 mg/dL — ABNORMAL HIGH (ref 0.61–1.24)
GFR calc Af Amer: 52 mL/min — ABNORMAL LOW (ref >60)
GFR calc non Af Amer: 45 mL/min — ABNORMAL LOW (ref >60)
Glucose, Bld: 150 mg/dL — ABNORMAL HIGH (ref 70–99)
Potassium: 4.2 mmol/L (ref 3.5–5.1)
Sodium: 134 mmol/L — ABNORMAL LOW (ref 135–145)

## 2019-12-09 LAB — BLOOD GAS, ARTERIAL
Acid-base deficit: 18.5 mmol/L — ABNORMAL HIGH (ref 0.0–2.0)
Bicarbonate: 8.1 mmol/L — ABNORMAL LOW (ref 20.0–28.0)
Drawn by: 560021
FIO2: 50
O2 Saturation: 98.1 %
Patient temperature: 36.9
pCO2 arterial: 21.7 mmHg — ABNORMAL LOW (ref 32.0–48.0)
pH, Arterial: 7.197 — CL (ref 7.350–7.450)
pO2, Arterial: 169 mmHg — ABNORMAL HIGH (ref 83.0–108.0)

## 2019-12-09 LAB — TROPONIN I (HIGH SENSITIVITY)
Troponin I (High Sensitivity): 37 ng/L — ABNORMAL HIGH (ref <18)
Troponin I (High Sensitivity): 30 ng/L — ABNORMAL HIGH (ref <18)
Troponin I (High Sensitivity): 43 ng/L — ABNORMAL HIGH (ref <18)
Troponin I (High Sensitivity): 43 ng/L — ABNORMAL HIGH (ref <18)

## 2019-12-09 LAB — RESPIRATORY PANEL BY RT PCR (FLU A&B, COVID)
Influenza A by PCR: NEGATIVE
Influenza B by PCR: NEGATIVE
SARS Coronavirus 2 by RT PCR: NEGATIVE

## 2019-12-09 LAB — BRAIN NATRIURETIC PEPTIDE: B Natriuretic Peptide: 2803 pg/mL — ABNORMAL HIGH (ref 0.0–100.0)

## 2019-12-09 LAB — APTT: aPTT: 30 seconds (ref 24–36)

## 2019-12-09 LAB — MAGNESIUM: Magnesium: 2.5 mg/dL — ABNORMAL HIGH (ref 1.7–2.4)

## 2019-12-09 LAB — ECHOCARDIOGRAM COMPLETE
Height: 67 in
Weight: 2793.67 oz

## 2019-12-09 LAB — PROTIME-INR
INR: 1.7 — ABNORMAL HIGH (ref 0.8–1.2)
Prothrombin Time: 19.9 seconds — ABNORMAL HIGH (ref 11.4–15.2)

## 2019-12-09 LAB — LACTIC ACID, PLASMA: Lactic Acid, Venous: >11 mmol/L (ref 0.5–1.9)

## 2019-12-09 LAB — HEPARIN LEVEL (UNFRACTIONATED): Heparin Unfractionated: 0.96 IU/mL — ABNORMAL HIGH (ref 0.30–0.70)

## 2019-12-09 MED ORDER — APIXABAN 5 MG PO TABS: 5.0000 mg | ORAL_TABLET | Freq: Two times a day (BID) | ORAL | Status: DC

## 2019-12-09 MED ORDER — MAGNESIUM SULFATE 2 GM/50ML IV SOLN
2.0000 g | Freq: Once | INTRAVENOUS | Status: AC
  Administered 2019-12-09: 2 g via INTRAVENOUS
  Filled 2019-12-09: qty 50

## 2019-12-09 MED ORDER — LORAZEPAM 2 MG/ML IJ SOLN
0.5000 mg | Freq: Once | INTRAMUSCULAR | Status: AC
  Administered 2019-12-09: 0.5 mg via INTRAVENOUS
  Filled 2019-12-09: qty 1

## 2019-12-09 MED ORDER — LENALIDOMIDE 10 MG PO CAPS: 10.0000 mg | ORAL_CAPSULE | Freq: Every day | ORAL | Status: DC

## 2019-12-09 MED ORDER — SIMVASTATIN 10 MG PO TABS: 20.0000 mg | ORAL_TABLET | Freq: Every day | ORAL | Status: DC

## 2019-12-09 MED ORDER — FUROSEMIDE 10 MG/ML IJ SOLN
40.0000 mg | Freq: Once | INTRAMUSCULAR | Status: AC
  Administered 2019-12-09: 40 mg via INTRAVENOUS
  Filled 2019-12-09: qty 4

## 2019-12-09 MED ORDER — NITROGLYCERIN 0.4 MG SL SUBL: 0.4000 mg | SUBLINGUAL_TABLET | SUBLINGUAL | Status: DC | PRN

## 2019-12-09 MED ORDER — ATORVASTATIN CALCIUM 10 MG PO TABS
10.0000 mg | ORAL_TABLET | Freq: Every day | ORAL | Status: DC
  Administered 2019-12-09 – 2019-12-26 (×16): 10 mg via ORAL
  Filled 2019-12-09 (×16): qty 1

## 2019-12-09 MED ORDER — MIDAZOLAM HCL 2 MG/2ML IJ SOLN
INTRAMUSCULAR | Status: AC
  Filled 2019-12-09: qty 2

## 2019-12-09 MED ORDER — SODIUM CHLORIDE 0.9 % IV SOLN
250.0000 mL | INTRAVENOUS | Status: DC | PRN
  Administered 2019-12-10 – 2019-12-19 (×5): 250 mL via INTRAVENOUS

## 2019-12-09 MED ORDER — MILRINONE LACTATE IN DEXTROSE 20-5 MG/100ML-% IV SOLN: 0.1250 ug/kg/min | INTRAVENOUS | Status: DC

## 2019-12-09 MED ORDER — NOREPINEPHRINE 4 MG/250ML-% IV SOLN
2.0000 ug/min | INTRAVENOUS | Status: DC
  Filled 2019-12-09: qty 250

## 2019-12-09 MED ORDER — METHOCARBAMOL 500 MG PO TABS: 750.0000 mg | ORAL_TABLET | Freq: Three times a day (TID) | ORAL | Status: DC | PRN

## 2019-12-09 MED ORDER — PANTOPRAZOLE SODIUM 40 MG PO TBEC
40.0000 mg | DELAYED_RELEASE_TABLET | Freq: Every day | ORAL | Status: DC
  Administered 2019-12-09: 40 mg via ORAL
  Filled 2019-12-09: qty 1

## 2019-12-09 MED ORDER — NOREPINEPHRINE 4 MG/250ML-% IV SOLN
INTRAVENOUS | Status: AC
  Administered 2019-12-09: 2 mg
  Filled 2019-12-09: qty 250

## 2019-12-09 MED ORDER — SODIUM CHLORIDE 0.9% FLUSH: 3.0000 mL | INTRAVENOUS | Status: DC | PRN

## 2019-12-09 MED ORDER — LOPERAMIDE HCL 2 MG PO CAPS
2.0000 mg | ORAL_CAPSULE | ORAL | Status: DC | PRN
  Filled 2019-12-09: qty 1

## 2019-12-09 MED ORDER — AMIODARONE LOAD VIA INFUSION
150.0000 mg | Freq: Once | INTRAVENOUS | Status: AC
  Administered 2019-12-09: 150 mg via INTRAVENOUS
  Filled 2019-12-09: qty 83.34

## 2019-12-09 MED ORDER — CLOPIDOGREL BISULFATE 75 MG PO TABS
75.0000 mg | ORAL_TABLET | Freq: Every day | ORAL | Status: DC
  Administered 2019-12-09: 75 mg via ORAL
  Filled 2019-12-09: qty 1

## 2019-12-09 MED ORDER — NOREPINEPHRINE 4 MG/250ML-% IV SOLN
0.0000 ug/min | INTRAVENOUS | Status: DC
  Administered 2019-12-09: 4 ug/min via INTRAVENOUS
  Administered 2019-12-10 (×3): 40 ug/min via INTRAVENOUS
  Administered 2019-12-10: 30 ug/min via INTRAVENOUS
  Filled 2019-12-09 (×5): qty 250

## 2019-12-09 MED ORDER — POTASSIUM CHLORIDE CRYS ER 20 MEQ PO TBCR
20.0000 meq | EXTENDED_RELEASE_TABLET | Freq: Two times a day (BID) | ORAL | Status: DC
  Administered 2019-12-09: 20 meq via ORAL
  Filled 2019-12-09 (×2): qty 1

## 2019-12-09 MED ORDER — DILTIAZEM LOAD VIA INFUSION
15.0000 mg | Freq: Once | INTRAVENOUS | Status: AC
  Administered 2019-12-09: 15 mg via INTRAVENOUS
  Filled 2019-12-09: qty 15

## 2019-12-09 MED ORDER — ALPRAZOLAM 0.25 MG PO TABS
0.2500 mg | ORAL_TABLET | Freq: Once | ORAL | Status: AC
  Administered 2019-12-09: 0.25 mg via ORAL
  Filled 2019-12-09: qty 1

## 2019-12-09 MED ORDER — DILTIAZEM HCL-DEXTROSE 125-5 MG/125ML-% IV SOLN (PREMIX)
5.0000 mg/h | INTRAVENOUS | Status: DC
  Administered 2019-12-09: 5 mg/h via INTRAVENOUS
  Filled 2019-12-09: qty 125

## 2019-12-09 MED ORDER — FUROSEMIDE 10 MG/ML IJ SOLN
40.0000 mg | Freq: Every day | INTRAMUSCULAR | Status: DC
  Administered 2019-12-09 – 2019-12-10 (×2): 40 mg via INTRAVENOUS
  Filled 2019-12-09 (×2): qty 4

## 2019-12-09 MED ORDER — METHYLPREDNISOLONE SODIUM SUCC 40 MG IJ SOLR
40.0000 mg | Freq: Two times a day (BID) | INTRAMUSCULAR | Status: DC
  Administered 2019-12-09 (×2): 40 mg via INTRAVENOUS
  Filled 2019-12-09 (×2): qty 1

## 2019-12-09 MED ORDER — METOPROLOL TARTRATE 12.5 MG HALF TABLET
12.5000 mg | ORAL_TABLET | Freq: Two times a day (BID) | ORAL | Status: DC
  Administered 2019-12-09: 12.5 mg via ORAL
  Filled 2019-12-09 (×2): qty 1

## 2019-12-09 MED ORDER — FENTANYL CITRATE (PF) 100 MCG/2ML IJ SOLN
INTRAMUSCULAR | Status: AC
  Filled 2019-12-09: qty 2

## 2019-12-09 MED ORDER — ALBUTEROL SULFATE (2.5 MG/3ML) 0.083% IN NEBU
2.5000 mg | INHALATION_SOLUTION | Freq: Four times a day (QID) | RESPIRATORY_TRACT | Status: DC | PRN
  Administered 2019-12-09: 2.5 mg via RESPIRATORY_TRACT
  Filled 2019-12-09: qty 3

## 2019-12-09 MED ORDER — ONDANSETRON HCL 4 MG PO TABS: 4.0000 mg | ORAL_TABLET | Freq: Four times a day (QID) | ORAL | Status: DC | PRN

## 2019-12-09 MED ORDER — NITROGLYCERIN IN D5W 200-5 MCG/ML-% IV SOLN: 5.0000 ug/min | INTRAVENOUS | Status: DC

## 2019-12-09 MED ORDER — FUROSEMIDE 10 MG/ML IJ SOLN
80.0000 mg | Freq: Once | INTRAMUSCULAR | Status: AC
  Administered 2019-12-09: 80 mg via INTRAVENOUS
  Filled 2019-12-09: qty 8

## 2019-12-09 MED ORDER — SIMVASTATIN 20 MG PO TABS: 10.0000 mg | ORAL_TABLET | Freq: Every day | ORAL | Status: DC

## 2019-12-09 MED ORDER — DOBUTAMINE IN D5W 4-5 MG/ML-% IV SOLN
2.5000 ug/kg/min | INTRAVENOUS | Status: DC
  Administered 2019-12-09: 2.5 ug/kg/min via INTRAVENOUS

## 2019-12-09 MED ORDER — BISACODYL 10 MG RE SUPP: 10.0000 mg | Freq: Every day | RECTAL | Status: DC | PRN

## 2019-12-09 MED ORDER — ORAL CARE MOUTH RINSE
15.0000 mL | Freq: Two times a day (BID) | OROMUCOSAL | Status: DC
  Administered 2019-12-10 (×2): 15 mL via OROMUCOSAL

## 2019-12-09 MED ORDER — ALBUTEROL SULFATE HFA 108 (90 BASE) MCG/ACT IN AERS: 2.0000 | INHALATION_SPRAY | Freq: Four times a day (QID) | RESPIRATORY_TRACT | Status: DC | PRN

## 2019-12-09 MED ORDER — ALPRAZOLAM 0.25 MG PO TABS
0.2500 mg | ORAL_TABLET | Freq: Two times a day (BID) | ORAL | Status: DC | PRN
  Administered 2019-12-09: 0.25 mg via ORAL
  Filled 2019-12-09: qty 1

## 2019-12-09 MED ORDER — ONDANSETRON HCL 4 MG/2ML IJ SOLN: 4.0000 mg | Freq: Four times a day (QID) | INTRAMUSCULAR | Status: DC | PRN

## 2019-12-09 MED ORDER — POTASSIUM CHLORIDE CRYS ER 20 MEQ PO TBCR: 20.0000 meq | EXTENDED_RELEASE_TABLET | Freq: Three times a day (TID) | ORAL | Status: DC

## 2019-12-09 MED ORDER — AMIODARONE IV BOLUS ONLY 150 MG/100ML
150.0000 mg | Freq: Once | INTRAVENOUS | Status: AC
  Administered 2019-12-09: 150 mg via INTRAVENOUS
  Filled 2019-12-09: qty 100

## 2019-12-09 MED ORDER — MILRINONE LACTATE IN DEXTROSE 20-5 MG/100ML-% IV SOLN: 0.2500 ug/kg/min | INTRAVENOUS | Status: DC

## 2019-12-09 MED ORDER — CHLORHEXIDINE GLUCONATE CLOTH 2 % EX PADS
6.0000 | MEDICATED_PAD | Freq: Every day | CUTANEOUS | Status: DC
  Administered 2019-12-09 – 2019-12-26 (×16): 6 via TOPICAL

## 2019-12-09 MED ORDER — SODIUM CHLORIDE 0.9% FLUSH
3.0000 mL | Freq: Two times a day (BID) | INTRAVENOUS | Status: DC
  Administered 2019-12-09 – 2019-12-27 (×34): 3 mL via INTRAVENOUS

## 2019-12-09 MED ORDER — FUROSEMIDE 10 MG/ML IJ SOLN
20.0000 mg | Freq: Once | INTRAMUSCULAR | Status: AC
  Administered 2019-12-09: 20 mg via INTRAVENOUS
  Filled 2019-12-09: qty 2

## 2019-12-09 MED ORDER — DOBUTAMINE IN D5W 4-5 MG/ML-% IV SOLN
2.5000 ug/kg/min | INTRAVENOUS | Status: DC
  Filled 2019-12-09: qty 250

## 2019-12-09 MED ORDER — HEPARIN (PORCINE) 25000 UT/250ML-% IV SOLN
1200.0000 [IU]/h | INTRAVENOUS | Status: DC
  Administered 2019-12-09: 1200 [IU]/h via INTRAVENOUS
  Filled 2019-12-09: qty 250

## 2019-12-09 MED ORDER — ACETAMINOPHEN 650 MG RE SUPP: 650.0000 mg | Freq: Four times a day (QID) | RECTAL | Status: DC | PRN

## 2019-12-09 MED ORDER — AMIODARONE LOAD VIA INFUSION
150.0000 mg | Freq: Once | INTRAVENOUS | Status: AC | PRN
  Administered 2019-12-10: 150 mg via INTRAVENOUS
  Filled 2019-12-09: qty 83.34

## 2019-12-09 MED ORDER — AMIODARONE HCL IN DEXTROSE 360-4.14 MG/200ML-% IV SOLN
30.0000 mg/h | INTRAVENOUS | Status: DC
  Administered 2019-12-09 – 2019-12-13 (×9): 30 mg/h via INTRAVENOUS
  Filled 2019-12-09 (×6): qty 200

## 2019-12-09 MED ORDER — ACETAMINOPHEN 325 MG PO TABS
650.0000 mg | ORAL_TABLET | Freq: Four times a day (QID) | ORAL | Status: DC | PRN
  Filled 2019-12-09: qty 2

## 2019-12-09 MED ORDER — TRAMADOL HCL 50 MG PO TABS
50.0000 mg | ORAL_TABLET | Freq: Three times a day (TID) | ORAL | Status: DC | PRN
  Administered 2019-12-09: 50 mg via ORAL
  Filled 2019-12-09: qty 1

## 2019-12-09 MED ORDER — LEVOCETIRIZINE DIHYDROCHLORIDE 5 MG PO TABS: 5.0000 mg | ORAL_TABLET | Freq: Every evening | ORAL | Status: DC

## 2019-12-09 MED ORDER — AMIODARONE HCL IN DEXTROSE 360-4.14 MG/200ML-% IV SOLN
60.0000 mg/h | INTRAVENOUS | Status: AC
  Administered 2019-12-09 (×2): 60 mg/h via INTRAVENOUS
  Filled 2019-12-09 (×2): qty 200

## 2019-12-09 MED ORDER — LENALIDOMIDE 10 MG PO CAPS
10.0000 mg | ORAL_CAPSULE | Freq: Every day | ORAL | Status: DC
  Administered 2019-12-09: 10 mg via ORAL
  Filled 2019-12-09 (×3): qty 1

## 2019-12-09 MED ORDER — POLYETHYLENE GLYCOL 3350 17 G PO PACK: 17.0000 g | PACK | Freq: Every day | ORAL | Status: DC | PRN

## 2019-12-09 MED ORDER — LORATADINE 10 MG PO TABS
10.0000 mg | ORAL_TABLET | Freq: Every day | ORAL | Status: DC
  Administered 2019-12-09: 10 mg via ORAL
  Filled 2019-12-09: qty 1

## 2019-12-09 NOTE — ED Triage Notes (Addendum)
Pt placed on prednisone yesterday for feeling "ill". Per pt's wife, since pt started taking prednisone; he has been agitated, cannot sleep, and appears sob. Per pt's wife; pt received 1st dose of Covid vaccine on the 12th.

## 2019-12-09 NOTE — Progress Notes (Signed)
Patient had a rectal temperature of 95.2 F.  His extremities were cool to the touch and was slightly cyanotic. Hard to get an accurate oxymetry result because of his low temperature. Tried placing warm blankets on the patient but he just threw them off and started becoming agitated.  Informed the on call cardiologist, and they were going to come up and assess him. Will continue to monitor.  Lupita Dawn, RN

## 2019-12-09 NOTE — H&P (Signed)
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  Has been placed on Cardizem drip. -Continue Cardizem infusion with titration for now and transition to amiodarone as below  -Continue Eliquis -Echocardiogram has been ordered -Cardiology consult   Other Active Problems: Shortness of breath, possibly secondary to CHF versus COPD exacerbation Patient has had increased dyspnea on exertion, cough, mild wheezing, bilateral lower extremity swelling.  Thought to have possible COPD exacerbation as outpatient and had been given steroids.  Noted to have significantly elevated BNP as well.  Dyspnea appears to be multifactorial, likely combination of COPD and CHF. -We will place on IV Lasix -Nebulizers and steroids  Elevated troponins Likely demand ischemia in the setting of A. fib with RVR and underlying history of coronary artery disease.  Nonsustained ventricular tachycardia Patient noted to have frequent runs  of V. tach.  Etiology is unclear.   Potassium level is normal.  Magnesium level is pending. -We will empirically give 2 g of IV magnesium -Amiodarone 150 mg IV bolus has been ordered followed by amiodarone infusion. Will transition from cardizem to amiodarone infusion -Repeat echocardiogram in a.m. -Cardiology consulted as above  COPD with possible acute exacerbation: -We will place on nebulizers -Empirically give short course of steroids  Sick sinus syndrome s/p pacemaker placement: -Plan for interrogation of pacemaker with cardiology  Coronary artery disease: History of stent placement x5 as per patient -Continue Plavix  Obstructive sleep apnea: -Continue CPAP at night  Hyperlipidemia: -Continue simvastatin  History of CVA with expressive aphasia Stable, at baseline.  No motor deficits. 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 fib with RVR, shortness of breath.  Noted to have nonsustained V. tach.  Placed on amiodarone infusion.  Cardiology consult in a.m.  Echocardiogram has been ordered. Consults called: N/A Admission status: Inpatient status    HPI:     HPI: Jeremy Parrish is a 79 y.o. male with medical history significant of coronary artery disease, paroxysmal atrial fibrillation, sick sinus syndrome status post pacer placement, multiple myeloma, chronic diarrhea, marginal zone lymphoma, COPD, hypertension, hyperlipidemia who presented to the  ER with worsening shortness of breath.  Patient has been having increased dyspnea  over the last 3 days.  Was placed on steroids as outpatient and as per family has been having increased agitation since then.  Also has had bilateral lower extremity swelling.  Noted orthopnea and dyspnea on exertion.  Reports nonspecific chest discomfort associated with shortness of breath.  No obvious chest pain.  No fever, chills reported.  No loss of sense of taste or smell.  No nausea, vomiting, abdominal pain.  Has history of chronic diarrhea. ED Course:  Vital Signs reviewed on presentation, significant for temperature 97.6, heart rate 122, blood pressure 134/87, saturation 99% on 2 L nasal cannula. Labs reviewed, significant for sodium 135, potassium 4.4, BUN 36, creatinine 1.35, LFTs within normal meds, BNP 2803, troponin 37, WBC count 7.2, hemoglobin 11.8, hematocrit 39, platelets 133.  SARS Covid RT-PCR is negative. Imaging personally Reviewed, chest x-ray shows cardiomegaly, bibasilar scarring or atelectasis. EKG personally reviewed, shows atrial fibrillation, multiple PVCs, left axis deviation,    Review of systems:    Review of Systems: As per HPI otherwise 10 point review of systems negative.  All other review of systems is negative except the ones noted above in the HPI.    Past Medical and Surgical History:  Reviewed by me  Past Medical History:  Diagnosis Date  . Anemia   . Aortic aneurysm of unspecified site without mention of rupture   . Arthritis   . Bladder neck contracture   . Cancer ([place])   . Cerebral atherosclerosis    Carotid Doppler, 02/16/2013 - Bilateral Proximal ICAs,demonstrate mild plaque w/o evidence of significant diameter reduction, dissection, or any other vascular abnormality  . CHF (congestive heart failure) ([place])   . Complication of anesthesia   . COPD (chronic obstructive pulmonary disease) ([place])   . Coronary artery disease   . Depression   . Esophageal reflux   . Heart disease   . Heart murmur   . Hx of bladder cancer 10/07/2012  .  Hyperlipidemia   . Hypertension   . 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 Intestinovesical fistula   . Kidney stones    history  . Lung mass   . Multiple myeloma   . Myocardial infarction [facility])    '96  . Non Hodgkin's lymphoma ([place])   . Paroxysmal atrial fibrillation ([place]) 01/02/2016  . Peripheral arterial disease ([place])   . Personal history of other diseases of circulatory system   . PONV (postoperative nausea and vomiting)   . Prostate cancer ([place]) 2000  . Shingles   . Shortness of breath   . Sleep apnea    05-02-14 cpap , not yet used- suggested settings 5  . Stroke [facility]) 2013   Speech.    Past Surgical History:  Procedure Laterality Date  . BLADDER SURGERY    . BONE MARROW TRANSPLANT  2011  . CARDIOVERSION N/A 06/02/2019   Procedure: CARDIOVERSION;  Surgeon: Sanda Klein, MD;  Location: [facility] ENDOSCOPY;  Service: Cardiovascular;  Laterality: N/A;  . COLON SURGERY     colon resection  . COLONOSCOPY N/A 01/01/2013   Procedure: COLONOSCOPY;  Surgeon: Rogene Houston, MD;  Location: [facility] ENDO SUITE;  Service: Endoscopy;  Laterality: N/A;  825-moved to [place] notified pt  . COLONOSCOPY N/A 07/16/2018   Procedure: COLONOSCOPY;  Surgeon: Rogene Houston, MD;  Location: [facility] ENDO SUITE;  Service: Endoscopy;  Laterality: N/A;  1:25  . CORONARY ANGIOPLASTY  06/24/2000   PCI and stenting in mid & proximal RCA  . heart stents x 5  1999  . INGUINAL HERNIA REPAIR Right 05/04/2014   Procedure: OPEN RIGHT INGUINAL HERNIA REPAIR with mesh;  Surgeon: Edward Jolly, MD;  Location: [facility] ORS;  Service: General;  Laterality: Right;  . INSERT / REPLACE / REMOVE PACEMAKER    . left ear skin cancer removed    . NM MYOCAR PERF WALL MOTION  11/27/2007   inferior scar  . PACEMAKER INSERTION  07/22/2011   Medtronic  . POLYPECTOMY  07/16/2018   Procedure: POLYPECTOMY;  Surgeon: Rogene Houston, MD;  Location: [facility] ENDO [facility];  Service:  Endoscopy;;  colon  . PORTACATH PLACEMENT  07/26/2009   right chest  . PROSTATE SURGERY    . Rotator    . ROTATOR CUFF REPAIR Right   . SHOULDER ARTHROSCOPY WITH SUBACROMIAL DECOMPRESSION Right 07/21/2013   Procedure: RIGHT SHOULDER ARTHROSCOPY WITH SUBACROMIAL DECOMPRESSION AND DEBRIDEMENT & Injection of Left Shoulder;  Surgeon: Alta Corning, MD;  Location: [facility];  Service: Orthopedics;  Laterality: Right;  . TEE WITHOUT CARDIOVERSION  10/13/2012   Procedure: TRANSESOPHAGEAL ECHOCARDIOGRAM (TEE);  Surgeon: Sanda Klein, MD;  Location: [facility] ENDOSCOPY;  Service: Cardiovascular;  Laterality: N/A;  pat/kay/echo notified  . US ECHOCARDIOGRAPHY  06/19/2011   RV mildly dilated,mild to mod. MR,mild AI,mild PI  . WRIST SURGERY     right     Social History:  Reviewed by me   reports that he quit smoking about 25 years ago. His smoking use included cigarettes. He has a 20.00 pack-year smoking history. He has never used smokeless tobacco. He reports that he does not drink alcohol or use drugs.  Allergies:    Allergies  Allergen Reactions  . Morphine And Related Other (See Comments)    hallucinations  . Tape Rash    Paper tape is ok    Family History :   Family History  Problem Relation Age of Onset  . Cancer Father        bladder  . Heart disease Father        before age 7  . Hypertension Mother   . Cancer Brother   . Heart disease Brother        before age 39  . Heart disease Sister        before age 80  . Hyperlipidemia Sister   . Hypertension Sister   . Heart attack Sister   . Colon cancer Neg Hx   . Colon polyps Neg Hx    Family history reviewed, noted as above, not pertinent to current presentation.   Home Medications:    Prior to Admission medications   Medication Sig Start Date End Date Taking? Authorizing Provider  albuterol (PROVENTIL HFA;VENTOLIN HFA) 108 (90 Base) MCG/ACT inhaler Inhale 2 puffs into the lungs every 6 (six) hours as needed for wheezing or  shortness of breath. 11/17/17   Mikey Kirschner, MD  albuterol (PROVENTIL) (2.5 MG/3ML) 0.083% nebulizer solution Take 2.5 mg by nebulization every 4 (four) hours as needed for wheezing or shortness of breath.  04/19/16   [provider]  ALPRAZolam Duanne Moron) 0.25 MG tablet Take 0.25 mg by mouth as needed.  06/14/19   [provider]  amLODipine (NORVASC) 5 MG tablet Take 5 mg by mouth daily.    [provider]  clopidogrel (PLAVIX) 75 MG tablet TAKE 1 TABLET BY MOUTH EVERY DAY 07/07/19  Croitoru, Mihai, MD  diltiazem (CARDIZEM CD) 120 MG 24 hr capsule Take 1 capsule (120 mg total) by mouth daily. 05/25/19   Croitoru, Mihai, MD  ELIQUIS 5 MG TABS tablet TAKE 1 TABLET BY MOUTH TWICE A DAY 10/20/19   Croitoru, Mihai, MD  furosemide (LASIX) 20 MG tablet Use sparingly as needed for edema. No more than one tablet by mouth per week. 07/29/17   Lorretta Harp, MD  HYDROcodone-acetaminophen (NORCO/VICODIN) 5-325 MG tablet Take 1 tablet by mouth 2 (two) times daily as needed. for pain 05/21/18   [provider]  ipratropium-albuterol (DUONEB) 0.5-2.5 (3) MG/3ML SOLN Take 3 mLs by nebulization every 6 (six) hours as needed (shortness of breath).  01/27/19   [provider]  lenalidomide (REVLIMID) 10 MG capsule TAKE 1 CAPSULE BY MOUTH  DAILY FOR 7 DAYS ON THEN 7  DAYS OFF THEN REPEAT CYCLE 11/19/19   Derek Jack, MD  levocetirizine (XYZAL) 5 MG tablet Take 5 mg by mouth every evening.     [provider]  loperamide (IMODIUM) 2 MG capsule TAKE 2 CAPSULES BY MOUTH 2 TIMES DAILY AS NEEDED FOR DIARRHEA OR LOOSE STOOLS. Patient not taking: Reported on 10/28/2019 10/22/19   Noralyn Pick, NP  methocarbamol (ROBAXIN) 750 MG tablet Take 750 mg by mouth every 8 (eight) hours as needed for muscle spasms.  02/06/19   [provider]  mometasone (NASONEX) 50 MCG/ACT nasal spray Place 2 sprays into the nose daily as needed (allergies).  06/10/18    [provider]  nitroGLYCERIN (NITROSTAT) 0.4 MG SL tablet PLACE 1 TABLET UNDER THE TONGUE EVERY 5 MINUTES AS NEEDED FOR CHEST PAIN. 08/27/19   Croitoru, Mihai, MD  octreotide (SANDOSTATIN LAR DEPOT) 10 MG injection Inject 10 mg into the muscle every 28 (twenty-eight) days. 06/28/19   Noralyn Pick, NP  omeprazole (PRILOSEC) 20 MG capsule Take 20 mg by mouth daily.    [provider]  ondansetron (ZOFRAN) 4 MG tablet Take 4 mg by mouth every 8 (eight) hours as needed for nausea or vomiting.  04/08/18   [provider]  potassium chloride SA (KLOR-CON M20) 20 MEQ tablet Take 1 tablet (20 mEq total) by mouth 3 (three) times daily. 10/15/19   Lockamy, Theresia Lo, NP-C  simvastatin (ZOCOR) 20 MG tablet Take 1 tablet (20 mg total) by mouth daily at 6 PM. 11/24/19   Croitoru, Dani Gobble, MD    Physical Exam:    Physical Exam: Vitals:   12/09/19 0051 12/09/19 0052  BP: (!) 136/103   Pulse: (!) 128   Resp: (!) 22   Temp: 97.6 F (36.4 C)   TempSrc: Oral   SpO2: 100%   Weight:  81.6 kg  Height:  '5\' 7"'$  (1.702 m)    Constitutional: NAD, calm, comfortable Vitals:   12/09/19 0051 12/09/19 0052  BP: (!) 136/103   Pulse: (!) 128   Resp: (!) 22   Temp: 97.6 F (36.4 C)   TempSrc: Oral   SpO2: 100%   Weight:  81.6 kg  Height:  '5\' 7"'$  (1.702 m)   Eyes: PERRL, lids and conjunctivae normal ENMT: Mucous membranes are moist. Posterior pharynx clear of any exudate or lesions.Normal dentition.  Neck: normal, supple, no masses, no thyromegaly Respiratory: clear to auscultation bilaterally, no wheezing, scattered bilateral basal crepitations.  Patient appears to be in mild to moderate respiratory distress Cardiovascular: Irregular rhythm, tachycardia, pacemaker in left chest wall, no murmurs / rubs / gallops.  Bilateral 2+  pedal edema. 2+ pedal pulses. No carotid bruits.  Abdomen: no tenderness, no masses palpated. No hepatosplenomegaly. Bowel sounds positive.    Musculoskeletal: no clubbing / cyanosis. No joint deformity upper and lower extremities. Good ROM, no contractures. Normal muscle tone.  Skin: no rashes, lesions, ulcers. No induration Neurologic: CN 2-12 grossly intact. Sensation intact, DTR normal. Strength 5/5 in all 4.  Psychiatric: Normal judgment and insight. Alert and oriented x 3. Normal mood.    Decubitus Ulcers: Not present on admission Catheters and tubes: None  Data Review:    Labs on Admission: I have personally reviewed following labs and imaging studies  CBC: Recent Labs  Lab 12/09/19 0130  WBC 7.2  NEUTROABS 5.2  HGB 11.9*  HCT 39.4  MCV 91.4  PLT 125*   Basic Metabolic Panel: Recent Labs  Lab 12/09/19 0130  NA 135  K 4.4  CL 102  CO2 22  GLUCOSE 128*  BUN 36*  CREATININE 1.35*  CALCIUM 9.6   GFR: Estimated Creatinine Clearance: 46.9 mL/min (A) (by C-G formula based on SCr of 1.35 mg/dL (H)). Liver Function Tests: Recent Labs  Lab 12/09/19 0130  AST 78*  ALT 70*  ALKPHOS 83  BILITOT 0.9  PROT 7.6  ALBUMIN 3.8   No results for input(s): LIPASE, AMYLASE in the last 168 hours. No results for input(s): AMMONIA in the last 168 hours. Coagulation Profile: No results for input(s): INR, PROTIME in the last 168 hours. Cardiac Enzymes: No results for input(s): CKTOTAL, CKMB, CKMBINDEX, TROPONINI in the last 168 hours. BNP (last 3 results) No results for input(s): PROBNP in the last 8760 hours. HbA1C: No results for input(s): HGBA1C in the last 72 hours. CBG: No results for input(s): GLUCAP in the last 168 hours. Lipid Profile: No results for input(s): CHOL, HDL, LDLCALC, TRIG, CHOLHDL, LDLDIRECT in the last 72 hours. Thyroid Function Tests: No results for input(s): TSH, T4TOTAL, FREET4, T3FREE, THYROIDAB in the last 72 hours. Anemia Panel: No results for input(s): VITAMINB12, FOLATE, FERRITIN, TIBC, IRON, RETICCTPCT in the last 72 hours. Urine analysis: No results found for: COLORURINE,  AP[place]ARANCEUR, [place], [place], [place], [place], [place], [place], PROTEINUR, [place], NITRITE, LEUKOCYTESUR   Imaging Results:      Radiological Exams on Admission: DG Chest Portable 1 View  Result Date: 12/09/2019 CLINICAL DATA:  Shortness of breath EXAM: PORTABLE CHEST 1 VIEW COMPARISON:  12/06/2019 FINDINGS: Cardiomegaly. Right Port-A-Cath and left pacer remain in place, unchanged. Bibasilar scarring or atelectasis. No confluent opacities, effusions or edema. IMPRESSION: Cardiomegaly.  Bibasilar scarring or atelectasis. Electronically Signed   By: Rolm Baptise M.D.   On: 12/09/2019 01:57      Kermit Arnette Ginette Otto MD [facility]  If 7PM-7AM, please contact night-coverage   12/09/2019, 4:14 AM

## 2019-12-09 NOTE — Significant Event (Addendum)
Rapid Response Event Note  Overview: Cardiogenic Shock  Initial Focused Assessment: Upon arrival, Dr. Marletta Lor was at the bedside. Jeremy Parrish is a seriously ill man in shock just placed on BIPAP.  ABG obtained by Tiffany RT. [name] is tachypneic with a RR 35-40 and difficult to obtain accurate pulse oximetry due to cyanotic extremities. He was oriented to self but disoriented to place and time. BBS Coarse rhonchi with accessory muscle use. Temp 95.2 F rectal, EKG shows paced rhythm at 63, BP 88/48 (60) in left arm .  Skin is pale, cool and mottled with cyanotic extremities. Cap refill > 3 secs.   ABG 7.19/22/163/8 prior to BIPAP with sats 98%  Interventions: -Stat ABG -BIPAP -Tx to 2H05 for Dobutamine and poss Norepi   Event Summary: While on dept went to see pt ([id number]) Call ended 2300  Madelynn Done

## 2019-12-09 NOTE — Progress Notes (Signed)
Patient having very frequent runs of vtach, only going into afib briefly before returning to [place]. Patient denies chest pain. Heart rate 95-125. MD aware. Orders placed and followed.

## 2019-12-09 NOTE — ED Provider Notes (Signed)
[facility] Provider Note   CSN: [id number] Arrival date & time: 12/09/19  0025     History Chief Complaint  Patient presents with  . Allergic Reaction    Jeremy Parrish is a 79 y.o. male.  HPI     Past Medical History:  Diagnosis Date  . Anemia   . Aortic aneurysm of unspecified site without mention of rupture   . Arthritis   . Bladder neck contracture   . Cancer ([place])   . Cerebral atherosclerosis    Carotid Doppler, 02/16/2013 - Bilateral Proximal ICAs,demonstrate mild plaque w/o evidence of significant diameter reduction, dissection, or any other vascular abnormality  . CHF (congestive heart failure) ([place])   . Complication of anesthesia   . COPD (chronic obstructive pulmonary disease) ([place])   . Coronary artery disease   . Depression   . Esophageal reflux   . Heart disease   . Heart murmur   . Hx of bladder cancer 10/07/2012  . Hyperlipidemia   . Hypertension   . 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 Intestinovesical fistula   . Kidney stones    history  . Lung mass   . Multiple myeloma   . Myocardial infarction [facility])    '96  . Non Hodgkin's lymphoma ([place])   . Paroxysmal atrial fibrillation ([place]) 01/02/2016  . Peripheral arterial disease ([place])   . Personal history of other diseases of circulatory system   . PONV (postoperative nausea and vomiting)   . Prostate cancer ([place]) 2000  . Shingles   . Shortness of breath   . Sleep apnea    05-02-14 cpap , not yet used- suggested settings 5  . Stroke [facility]) 2013   Speech.    Patient Active Problem List   Diagnosis Date Noted  . Acute exacerbation of CHF (congestive heart failure) ([place]) 12/09/2019  . Atrial fibrillation with RVR ([place]) 12/09/2019  . Abdominal bloating 06/22/2019  . Other persistent atrial fibrillation ([place])   . History of colonic polyps 05/25/2018  . CAP (community acquired pneumonia) 03/11/2018  . COPD  with acute exacerbation ([place]) 03/11/2018  . Vitamin B12 deficiency   . Long term current use of anticoagulant 12/25/2016  . COPD (chronic obstructive pulmonary disease) ([place]) 04/18/2016  . Paroxysmal atrial fibrillation ([place]) 01/02/2016  . Hypokalemia 11/03/2014  . URI (upper respiratory infection) 11/03/2014  . Weakness 11/03/2014  . Obstructive sleep apnea 08/07/2014  . Central sleep apnea 08/07/2014  . Inguinal hernia 05/04/2014  . Chest pain with moderate risk for cardiac etiology 11/25/2013  . Fever 09/13/2013  . Pneumonia 09/13/2013  . Pancytopenia ([place]) 09/13/2013  . Muscle weakness (generalized) 08/04/2013  . Status post arthroscopy of shoulder 08/04/2013  . Pain in joint, shoulder region 08/04/2013  . Rotator cuff tear arthropathy of right shoulder 07/21/2013  . Left rotator cuff tear arthropathy 07/21/2013  . Shortness of breath 07/07/2013  . 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 Diarrhea 12/14/2012  . Hyperlipidemia with target LDL less than 100 10/08/2012  . Prediabetes 10/08/2012  . Expressive aphasia 10/08/2012  . Hemiplegia affecting right dominant side ([place]) 10/08/2012  . 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 Hx of bladder cancer 10/07/2012  . 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 ASTHMA, UNSPECIFIED 03/22/2010  . Nonspecific (abnormal) findings on radiological and other examination of body  structure 03/22/2010  . Hx of lymphoma 03/21/2010  . Multiple myeloma in remission ([employer]) 03/21/2010  . Anxiety state 03/21/2010  . Essential hypertension 03/21/2010  . MYOCARDIAL INFARCTION 03/21/2010  . Coronary atherosclerosis 03/21/2010  . NEPHROLITHIASIS 03/21/2010  . ELEVATED PROSTATE SPECIFIC ANTIGEN 03/21/2010  . ROTATOR CUFF REPAIR, RIGHT, HX OF 03/21/2010    Past Surgical History:  Procedure Laterality Date  .  BLADDER SURGERY    . BONE MARROW TRANSPLANT  2011  . CARDIOVERSION N/A 06/02/2019   Procedure: CARDIOVERSION;  Surgeon: Sanda Klein, MD;  Location: [facility] ENDOSCOPY;  Service: Cardiovascular;  Laterality: N/A;  . COLON SURGERY     colon resection  . COLONOSCOPY N/A 01/01/2013   Procedure: COLONOSCOPY;  Surgeon: Rogene Houston, MD;  Location: [facility] ENDO SUITE;  Service: Endoscopy;  Laterality: N/A;  825-moved to [place] notified pt  . COLONOSCOPY N/A 07/16/2018   Procedure: COLONOSCOPY;  Surgeon: Rogene Houston, MD;  Location: [facility] ENDO SUITE;  Service: Endoscopy;  Laterality: N/A;  1:25  . CORONARY ANGIOPLASTY  06/24/2000   PCI and stenting in mid & proximal RCA  . heart stents x 5  1999  . INGUINAL HERNIA REPAIR Right 05/04/2014   Procedure: OPEN RIGHT INGUINAL HERNIA REPAIR with mesh;  Surgeon: Edward Jolly, MD;  Location: [facility] ORS;  Service: General;  Laterality: Right;  . INSERT / REPLACE / REMOVE PACEMAKER    . left ear skin cancer removed    . NM MYOCAR PERF WALL MOTION  11/27/2007   inferior scar  . PACEMAKER INSERTION  07/22/2011   Medtronic  . POLYPECTOMY  07/16/2018   Procedure: POLYPECTOMY;  Surgeon: Rogene Houston, MD;  Location: [facility] ENDO SUITE;  Service: Endoscopy;;  colon  . PORTACATH PLACEMENT  07/26/2009   right chest  . PROSTATE SURGERY    . Rotator    . ROTATOR CUFF REPAIR Right   . SHOULDER ARTHROSCOPY WITH SUBACROMIAL DECOMPRESSION Right 07/21/2013   Procedure: RIGHT SHOULDER ARTHROSCOPY WITH SUBACROMIAL DECOMPRESSION AND DEBRIDEMENT & Injection of Left Shoulder;  Surgeon: Alta Corning, MD;  Location: [facility];  Service: Orthopedics;  Laterality: Right;  . TEE WITHOUT CARDIOVERSION  10/13/2012   Procedure: TRANSESOPHAGEAL ECHOCARDIOGRAM (TEE);  Surgeon: Sanda Klein, MD;  Location: [facility] ENDOSCOPY;  Service: Cardiovascular;  Laterality: N/A;  pat/kay/echo notified  . US ECHOCARDIOGRAPHY  06/19/2011   RV mildly dilated,mild to mod. MR,mild AI,mild PI  . WRIST SURGERY     right        Family History  Problem Relation Age of Onset  . Cancer Father        bladder  . Heart disease Father        before age 70  . Hypertension Mother   . Cancer Brother   . Heart disease Brother        before age 75  . Heart disease Sister        before age 59  . Hyperlipidemia Sister   . Hypertension Sister   . Heart attack Sister   . Colon cancer Neg Hx   . Colon polyps Neg Hx     Social History   Tobacco Use  . Smoking status: Former Smoker    Packs/day: 1.00    Years: 20.00    Pack years: 20.00    Types: Cigarettes    Quit date: 11/14/1994    Years since quitting: 25.0  . Smokeless tobacco: Never Used  Substance Use Topics  . Alcohol use: No  Alcohol/week: 0.0 standard drinks    Comment: previously drank but none for at least 15 years.  . Drug use: No    Home Medications Prior to Admission medications   Medication Sig Start Date End Date Taking? Authorizing Provider  albuterol (PROVENTIL HFA;VENTOLIN HFA) 108 (90 Base) MCG/ACT inhaler Inhale 2 puffs into the lungs every 6 (six) hours as needed for wheezing or shortness of breath. 11/17/17   Mikey Kirschner, MD  albuterol (PROVENTIL) (2.5 MG/3ML) 0.083% nebulizer solution Take 2.5 mg by nebulization every 4 (four) hours as needed for wheezing or shortness of breath.  04/19/16   [provider]  ALPRAZolam Duanne Moron) 0.25 MG tablet Take 0.25 mg by mouth as needed.  06/14/19   [provider]  amLODipine (NORVASC) 5 MG tablet Take 5 mg by mouth daily.    [provider]  clopidogrel (PLAVIX) 75 MG tablet TAKE 1 TABLET BY MOUTH EVERY DAY 07/07/19   Croitoru, Mihai, MD  diltiazem (CARDIZEM CD) 120 MG 24 hr capsule Take 1 capsule (120 mg total) by mouth daily. 05/25/19   Croitoru, Mihai, MD  ELIQUIS 5 MG TABS tablet TAKE 1 TABLET BY MOUTH TWICE A DAY 10/20/19   Croitoru, Mihai, MD  furosemide (LASIX) 20 MG tablet Use sparingly as needed for edema. No more than one tablet by mouth per week. 07/29/17    Lorretta Harp, MD  HYDROcodone-acetaminophen (NORCO/VICODIN) 5-325 MG tablet Take 1 tablet by mouth 2 (two) times daily as needed. for pain 05/21/18   [provider]  ipratropium-albuterol (DUONEB) 0.5-2.5 (3) MG/3ML SOLN Take 3 mLs by nebulization every 6 (six) hours as needed (shortness of breath).  01/27/19   [provider]  lenalidomide (REVLIMID) 10 MG capsule TAKE 1 CAPSULE BY MOUTH  DAILY FOR 7 DAYS ON THEN 7  DAYS OFF THEN REPEAT CYCLE 11/19/19   Derek Jack, MD  levocetirizine (XYZAL) 5 MG tablet Take 5 mg by mouth every evening.     [provider]  loperamide (IMODIUM) 2 MG capsule TAKE 2 CAPSULES BY MOUTH 2 TIMES DAILY AS NEEDED FOR DIARRHEA OR LOOSE STOOLS. Patient not taking: Reported on 10/28/2019 10/22/19   Noralyn Pick, NP  methocarbamol (ROBAXIN) 750 MG tablet Take 750 mg by mouth every 8 (eight) hours as needed for muscle spasms.  02/06/19   [provider]  mometasone (NASONEX) 50 MCG/ACT nasal spray Place 2 sprays into the nose daily as needed (allergies).  06/10/18   [provider]  nitroGLYCERIN (NITROSTAT) 0.4 MG SL tablet PLACE 1 TABLET UNDER THE TONGUE EVERY 5 MINUTES AS NEEDED FOR CHEST PAIN. 08/27/19   Croitoru, Mihai, MD  octreotide (SANDOSTATIN LAR DEPOT) 10 MG injection Inject 10 mg into the muscle every 28 (twenty-eight) days. 06/28/19   Noralyn Pick, NP  omeprazole (PRILOSEC) 20 MG capsule Take 20 mg by mouth daily.    [provider]  ondansetron (ZOFRAN) 4 MG tablet Take 4 mg by mouth every 8 (eight) hours as needed for nausea or vomiting.  04/08/18   [provider]  potassium chloride SA (KLOR-CON M20) 20 MEQ tablet Take 1 tablet (20 mEq total) by mouth 3 (three) times daily. 10/15/19   Lockamy, Randi L, NP-C  simvastatin (ZOCOR) 20 MG tablet Take 1 tablet (20 mg total) by mouth daily at 6 PM. 11/24/19   Croitoru, Mihai, MD    Allergies    Morphine and related and Tape  Creatinine 1.3 in 36 Review of Systems  Review of Systems  Physical Exam Updated Vital Signs BP (!) 148/98   Pulse 99   Temp 98 F (36.7 C) (Oral)   Resp (!) 35   Ht '5\' 7"'$  (1.702 m)   Wt 79.2 kg   SpO2 96%   BMI 27.35 kg/m   Physical Exam  ED Results / Procedures / Treatments   Labs (all labs ordered are listed, but only abnormal results are displayed) Labs Reviewed  COMPREHENSIVE METABOLIC PANEL - Abnormal; Notable for the following components:      Result Value   Glucose, Bld 128 (*)    BUN 36 (*)    Creatinine, Ser 1.35 (*)    AST 78 (*)    ALT 70 (*)    GFR calc non Af Amer 50 (*)    GFR calc Af Amer 58 (*)    All other components within normal limits  BRAIN NATRIURETIC PEPTIDE - Abnormal; Notable for the following components:   B Natriuretic Peptide 2,803.0 (*)    All other components within normal limits  CBC WITH DIFFERENTIAL/PLATELET - Abnormal; Notable for the following components:   Hemoglobin 11.9 (*)    RDW 18.7 (*)    Platelets 133 (*)    nRBC 0.3 (*)    All other components within normal limits  TROPONIN I (HIGH SENSITIVITY) - Abnormal; Notable for the following components:   Troponin I (High Sensitivity) 37 (*)    All other components within normal limits  TROPONIN I (HIGH SENSITIVITY) - Abnormal; Notable for the following components:   Troponin I (High Sensitivity) 30 (*)    All other components within normal limits  RESPIRATORY PANEL BY RT PCR (FLU A&B, COVID)  MRSA PCR SCREENING    EKG None  Radiology DG Chest Portable 1 View  Result Date: 12/09/2019 CLINICAL DATA:  Shortness of breath EXAM: PORTABLE CHEST 1 VIEW COMPARISON:  12/06/2019 FINDINGS: Cardiomegaly. Right Port-A-Cath and left pacer remain in place, unchanged. Bibasilar scarring or atelectasis. No confluent opacities, effusions or edema. IMPRESSION: Cardiomegaly.  Bibasilar scarring or atelectasis. Electronically Signed   By: Rolm Baptise M.D.   On: 12/09/2019 01:57     Procedures .Critical Care Performed by: [name], MD Authorized by: [name], MD   Critical care provider statement:    Critical care time (minutes):  40   Critical care start time:  12/09/2019 1:20 AM   Critical care end time:  12/09/2019 2:00 AM   Critical care time was exclusive of:  Separately billable procedures and treating other patients and teaching time   Critical care was necessary to treat or prevent imminent or life-threatening deterioration of the following conditions:  Cardiac failure   Critical care was time spent personally by me on the following activities:  Discussions with consultants, evaluation of patient's response to treatment, examination of patient, ordering and performing treatments and interventions, ordering and review of laboratory studies, ordering and review of radiographic studies, pulse oximetry, re-evaluation of patient's condition and review of old charts Comments:     afib rvr   (including critical care time)  Medications Ordered in ED Medications  diltiazem (CARDIZEM) 1 mg/mL load via infusion 15 mg (15 mg Intravenous Bolus from Bag 12/09/19 0257)    And  diltiazem (CARDIZEM) 125 mg in dextrose 5% 125 mL (1 mg/mL) infusion (10 mg/hr Intravenous Rate/Dose Change 12/09/19 0331)  nitroGLYCERIN (NITROSTAT) SL tablet 0.4 mg (has no administration in time range)  pantoprazole (PROTONIX) EC tablet 40 mg (has no administration in  time range)  loperamide (IMODIUM) capsule 2 mg (has no administration in time range)  clopidogrel (PLAVIX) tablet 75 mg (has no administration in time range)  apixaban (ELIQUIS) tablet 5 mg (has no administration in time range)  lenalidomide (REVLIMID) capsule 10 mg (has no administration in time range)  methocarbamol (ROBAXIN) tablet 750 mg (has no administration in time range)  sodium chloride flush (NS) 0.9 % injection 3 mL (has no administration in time range)  sodium chloride flush (NS) 0.9 % injection 3 mL (has no  administration in time range)  0.9 %  sodium chloride infusion (has no administration in time range)  acetaminophen (TYLENOL) tablet 650 mg (has no administration in time range)    Or  acetaminophen (TYLENOL) suppository 650 mg (has no administration in time range)  traMADol (ULTRAM) tablet 50 mg (has no administration in time range)  polyethylene glycol (MIRALAX / GLYCOLAX) packet 17 g (has no administration in time range)  bisacodyl (DULCOLAX) suppository 10 mg (has no administration in time range)  ondansetron (ZOFRAN) tablet 4 mg (has no administration in time range)    Or  ondansetron (ZOFRAN) injection 4 mg (has no administration in time range)  simvastatin (ZOCOR) tablet 10 mg (has no administration in time range)  albuterol (PROVENTIL) (2.5 MG/3ML) 0.083% nebulizer solution 2.5 mg (has no administration in time range)  loratadine (CLARITIN) tablet 10 mg (has no administration in time range)  potassium chloride SA (KLOR-CON) CR tablet 20 mEq (has no administration in time range)  amiodarone (NEXTERONE) 1.8 mg/mL load via infusion 150 mg (has no administration in time range)  amiodarone (NEXTERONE PREMIX) 360-4.14 MG/200ML-% (1.8 mg/mL) IV infusion (60 mg/hr Intravenous New Bag/Given 12/09/19 0635)    Followed by  amiodarone (NEXTERONE PREMIX) 360-4.14 MG/200ML-% (1.8 mg/mL) IV infusion (has no administration in time range)  furosemide (LASIX) injection 40 mg (has no administration in time range)  LORazepam (ATIVAN) injection 0.5 mg (0.5 mg Intravenous Given 12/09/19 0253)  furosemide (LASIX) injection 20 mg (20 mg Intravenous Given 12/09/19 0429)  magnesium sulfate IVPB 2 g 50 mL (2 g Intravenous New Bag/Given 12/09/19 0616)  amiodarone (NEXTERONE) 1.8 mg/mL load via infusion 150 mg (150 mg Intravenous Bolus from Bag 12/09/19 5732)    ED Course  I have reviewed the triage vital signs and the nursing notes.  Pertinent labs & imaging results that were available during my care of the  patient were reviewed by me and considered in my medical decision making (see chart for details).    MDM Rules/Calculators/A&P                     Patient recently put on steroids presents with worsening shortness of breath, tachycardia and not feeling well.  Clinically concern for atrial fibrillation RVR as cause of symptoms and possible mild CHF with mild leg edema and history of heart failure.  Recent echo reviewed from January 3 showing ejection fraction 45% and mild regurgitation of aortic and mitral valve.  Last pacemaker check was December 16 and battery was normal.  Plan for blood work, cardiac screen, diltiazem bolus and drip, Covid testing with mild cough and shortness of breath and likely admission to the hospital.  IV Lasix dose ordered.  Blood work reviewed mild elevated troponin, elevated BNP, chest x-ray minimal findings reviewed.  Patient's heart rate improved to low 100s on reassessment with diltiazem drip.  Creatinine elevated 1.3, BUN 36.  With patient's increased work of breathing, clinical congestive heart failure exacerbation,  elevated troponin and atrial fibrillation RVR plan for admission.  CHA2DS2/VAS Stroke Risk Points  Current as of 5 minutes ago     7 >= 2 Points: High Risk  1 - 1.99 Points: Medium Risk  0 Points: Low Risk    The patient's score has not changed in the past year.: No Change     Details    This score determines the patient's risk of having a stroke if the  patient has atrial fibrillation.       Points Metrics  1 Has Congestive Heart Failure:  Yes    Current as of 5 minutes ago  1 Has Vascular Disease:  Yes    Current as of 5 minutes ago  1 Has Hypertension:  Yes    Current as of 5 minutes ago  2 Age:  81    Current as of 5 minutes ago  0 Has Diabetes:  No    Current as of 5 minutes ago  2 Had Stroke:  Yes  Had TIA:  No  Had thromboembolism:  No    Current as of 5 minutes ago  0 Male:  No    Current as of 5 minutes ago     Pt on eliquis.          Final Clinical Impression(s) / ED Diagnoses Final diagnoses:  Acute dyspnea  Atrial fibrillation with RVR ([place])  History of chronic CHF    Rx / DC Orders ED Discharge Orders    None       [name], MD 12/09/19 0740

## 2019-12-09 NOTE — Progress Notes (Signed)
ANTICOAGULATION CONSULT NOTE - Initial Consult  Pharmacy Consult for heparin IV Indication: atrial fibrillation  Allergies  Allergen Reactions  . Morphine And Related Other (See Comments)    hallucinations  . Tape Rash    Paper tape is ok    Patient Measurements: Height: _0  (170.2 cm) Weight: 174 lb 9.7 oz (79.2 kg) IBW/kg (Calculated) : 66.1 Heparin Dosing Weight: 79 kg  Vital Signs: Temp: 98.3 F (36.8 C) (01/28 0742) Temp Source: Oral (01/28 0742) BP: 107/86 (01/28 0730) Pulse Rate: 121 (01/28 0730)  Labs: Recent Labs    12/09/19 0130 12/09/19 0355  HGB 11.9*  --   HCT 39.4  --   PLT 133*  --   CREATININE 1.35*  --   TROPONINIHS 37* 30*    Estimated Creatinine Clearance: 42.8 mL/min (A) (by C-G formula based on SCr of 1.35 mg/dL (H)).   Medical History: Past Medical History:  Diagnosis Date  . Anemia   . Aortic aneurysm of unspecified site without mention of rupture   . Arthritis   . Bladder neck contracture   . Cancer ([place])   . Cerebral atherosclerosis    Carotid Doppler, 02/16/2013 - Bilateral Proximal ICAs,demonstrate mild plaque w/o evidence of significant diameter reduction, dissection, or any other vascular abnormality  . CHF (congestive heart failure) ([place])   . Complication of anesthesia   . COPD (chronic obstructive pulmonary disease) ([place])   . Coronary artery disease   . Depression   . Esophageal reflux   . Heart disease   . Heart murmur   . Hx of bladder cancer 10/07/2012  . Hyperlipidemia   . Hypertension   . 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 Intestinovesical fistula   . Kidney stones    history  . Lung mass   . Multiple myeloma   . Myocardial infarction [facility])    '96  . Non Hodgkin's lymphoma ([place])   . Paroxysmal atrial fibrillation ([place]) 01/02/2016  . Peripheral arterial disease ([place])   . Personal history of other diseases of circulatory system   . PONV  (postoperative nausea and vomiting)   . Prostate cancer ([place]) 2000  . Shingles   . Shortness of breath   . Sleep apnea    05-02-14 cpap , not yet used- suggested settings 5  . Stroke [facility]) 2013   Speech.    Medications:  Facility-Administered Medications Prior to Admission  Medication Dose Route Frequency Provider Last Rate Last Admin  . sodium chloride flush (NS) 0.9 % injection 3 mL  3 mL Intravenous Q12H Croitoru, Mihai, MD       Medications Prior to Admission  Medication Sig Dispense Refill Last Dose  . albuterol (PROVENTIL HFA;VENTOLIN HFA) 108 (90 Base) MCG/ACT inhaler Inhale 2 puffs into the lungs every 6 (six) hours as needed for wheezing or shortness of breath. 1 Inhaler 3   . albuterol (PROVENTIL) (2.5 MG/3ML) 0.083% nebulizer solution Take 2.5 mg by nebulization every 4 (four) hours as needed for wheezing or shortness of breath.      . ALPRAZolam (XANAX) 0.25 MG tablet Take 0.25 mg by mouth as needed.      Marland Kitchen amLODipine (NORVASC) 5 MG tablet Take 5 mg by mouth daily.     . clopidogrel (PLAVIX) 75 MG tablet TAKE 1 TABLET BY MOUTH EVERY DAY 90 tablet 2   . diltiazem (CARDIZEM CD) 120 MG 24 hr capsule Take 1 capsule (120 mg total) by  mouth daily. 30 capsule 6   . ELIQUIS 5 MG TABS tablet TAKE 1 TABLET BY MOUTH TWICE A DAY 180 tablet 2   . furosemide (LASIX) 20 MG tablet Use sparingly as needed for edema. No more than one tablet by mouth per week. 21 tablet 0   . HYDROcodone-acetaminophen (NORCO/VICODIN) 5-325 MG tablet Take 1 tablet by mouth 2 (two) times daily as needed. for pain  0   . ipratropium-albuterol (DUONEB) 0.5-2.5 (3) MG/3ML SOLN Take 3 mLs by nebulization every 6 (six) hours as needed (shortness of breath).      Marland Kitchen lenalidomide (REVLIMID) 10 MG capsule TAKE 1 CAPSULE BY MOUTH  DAILY FOR 7 DAYS ON THEN 7  DAYS OFF THEN REPEAT CYCLE 14 capsule 0   . levocetirizine (XYZAL) 5 MG tablet Take 5 mg by mouth every evening.      . loperamide (IMODIUM) 2 MG capsule TAKE 2  CAPSULES BY MOUTH 2 TIMES DAILY AS NEEDED FOR DIARRHEA OR LOOSE STOOLS. (Patient not taking: Reported on 10/28/2019) 90 capsule 0   . methocarbamol (ROBAXIN) 750 MG tablet Take 750 mg by mouth every 8 (eight) hours as needed for muscle spasms.      . mometasone (NASONEX) 50 MCG/ACT nasal spray Place 2 sprays into the nose daily as needed (allergies).   1   . nitroGLYCERIN (NITROSTAT) 0.4 MG SL tablet PLACE 1 TABLET UNDER THE TONGUE EVERY 5 MINUTES AS NEEDED FOR CHEST PAIN. 25 tablet 4   . octreotide (SANDOSTATIN LAR DEPOT) 10 MG injection Inject 10 mg into the muscle every 28 (twenty-eight) days. 1 each 0   . omeprazole (PRILOSEC) 20 MG capsule Take 20 mg by mouth daily.     . ondansetron (ZOFRAN) 4 MG tablet Take 4 mg by mouth every 8 (eight) hours as needed for nausea or vomiting.   1   . potassium chloride SA (KLOR-CON M20) 20 MEQ tablet Take 1 tablet (20 mEq total) by mouth 3 (three) times daily. 270 tablet 3   . simvastatin (ZOCOR) 20 MG tablet Take 1 tablet (20 mg total) by mouth daily at 6 PM. 90 tablet 1     Assessment: Pharmacy consulted to dose heparin in patient with atrial fibrillation.  Patient is on apixaban prior to admission so will need to monitor based on aPTT until correlation with heparin levels. Med rec not done yet so unsure of last dose.  Goal of Therapy:  Heparin level 0.3-0.7 units/ml aPTT 66-102 seconds Monitor platelets by anticoagulation protocol: Yes   Plan:  Start heparin infusion at 1200 units/hr Check anti-Xa level in 8 hours and daily while on heparin Continue to monitor H&H and platelets  Margot Ables, PharmD Clinical Pharmacist 12/09/2019 8:44 AM

## 2019-12-09 NOTE — Progress Notes (Signed)
Per HPI: Jeremy [place]rish is a 79 y.o. male with medical history significant of coronary artery disease, paroxysmal atrial fibrillation, sick sinus syndrome status post pacer placement, multiple myeloma, chronic diarrhea, marginal zone lymphoma, COPD, hypertension, hyperlipidemia who presented to the ER with worsening shortness of breath.  Patient has been having increased dyspnea over the last 3 days.  Was placed on steroids as outpatient and as per family has been having increased agitation since then.  Also has had bilateral lower extremity swelling.  Noted orthopnea and dyspnea on exertion.  Reports nonspecific chest discomfort associated with shortness of breath.  No obvious chest pain.  No fever, chills reported.  No loss of sense of taste or smell.  No nausea, vomiting, abdominal pain.  Has history of chronic diarrhea. ED Course:  Vital Signs reviewed on presentation, significant for temperature 97.6, heart rate 122, blood pressure 134/87, saturation 99% on 2 L nasal cannula. Labs reviewed, significant for sodium 135, potassium 4.4, BUN 36, creatinine 1.35, LFTs within normal meds, BNP 2803, troponin 37, WBC count 7.2, hemoglobin 11.8, hematocrit 39, platelets 133.  SARS Covid RT-PCR is negative. Imaging personally Reviewed, chest x-ray shows cardiomegaly, bibasilar scarring or atelectasis. EKG personally reviewed, shows atrial fibrillation, multiple PVCs, left axis deviation  Patient was admitted with atrial fibrillation with RVR and was placed on Cardizem and amiodarone infusion.  He was also noted to have CHF exacerbation related to his arrhythmia.  I have seen and evaluated him at bedside and he currently denies any palpitations, chest pain, shortness of breath.  He has been seen by cardiology and has been changed to metoprolol for heart rate control given his low ejection fraction and Cardizem has been discontinued.  He remains on amiodarone infusion with ongoing episodes of V. tach.  He will have  interrogation of his pacemaker by [employer] as well.  Eliquis has been discontinued and IV heparin started for anticipated catheterization in a.m.    Repeat 2D echocardiogram reveals LVEF 30-35%.  Plans are for transfer to [facility] for cardiac catheterization.  He will remain on cardiology service upon transfer.  Total care time: 30 minutes.

## 2019-12-09 NOTE — Progress Notes (Signed)
Pt has increased work of breathing. Pt states he is anxious. Cardiology made aware, new orders received. Will continue to monitor.

## 2019-12-09 NOTE — Progress Notes (Signed)
  Echocardiogram 2D Echocardiogram has been performed.  [name] 12/09/2019, 9:35 AM

## 2019-12-09 NOTE — Progress Notes (Signed)
Report given to [facility] on 4 E. Wife at bedside made aware. Awaiting transport.

## 2019-12-09 NOTE — ED Notes (Signed)
. ED TO INPATIENT HANDOFF REPORT  ED Nurse Name and Phone #: Fabio Neighbors RN [phone]  S Name/Age/Gender Jeremy [place]rish 79 y.o. male Room/Bed: APA03/APA03  Code Status   Code Status: Full Code  Home/SNF/Other Home Patient oriented to: self, place, time and situation Is this baseline? Yes   Triage Complete: Triage complete  Chief Complaint Atrial fibrillation with RVR ([place]) [I48.91]  Triage Note Pt placed on prednisone yesterday for feeling "ill". Per pt's wife, since pt started taking prednisone; he has been agitated, cannot sleep, and appears sob. Per pt's wife; pt received 1st dose of Covid vaccine on the 12th.    Allergies Allergies  Allergen Reactions  . Morphine And Related Other (See Comments)    hallucinations  . Tape Rash    Paper tape is ok    Level of Care/Admitting Diagnosis ED Disposition    ED Disposition Condition [place] [facility] Area: [facility] [[id number]]  Level of Care: Stepdown [14]  Covid Evaluation: Asymptomatic Screening Protocol (No Symptoms)  Diagnosis: Atrial fibrillation with RVR [facility]) [[id number]]  Admitting Physician: Lynetta Mare [[id number]]  Attending Physician: Lynetta Mare [[id number]]  Estimated length of stay: 3 - 4 days  Certification:: I certify this patient will need inpatient services for at least 2 midnights       B Medical/Surgery History Past Medical History:  Diagnosis Date  . Anemia   . Aortic aneurysm of unspecified site without mention of rupture   . Arthritis   . Bladder neck contracture   . Cancer ([place])   . Cerebral atherosclerosis    Carotid Doppler, 02/16/2013 - Bilateral Proximal ICAs,demonstrate mild plaque w/o evidence of significant diameter reduction, dissection, or any other vascular abnormality  . CHF (congestive heart failure) ([place])   . Complication of anesthesia   . COPD (chronic obstructive pulmonary disease) ([place])   . Coronary artery disease   . Depression   . Esophageal  reflux   . Heart disease   . Heart murmur   . Hx of bladder cancer 10/07/2012  . Hyperlipidemia   . Hypertension   . 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 Intestinovesical fistula   . Kidney stones    history  . Lung mass   . Multiple myeloma   . Myocardial infarction [facility])    '96  . Non Hodgkin's lymphoma ([place])   . Paroxysmal atrial fibrillation ([place]) 01/02/2016  . Peripheral arterial disease ([place])   . Personal history of other diseases of circulatory system   . PONV (postoperative nausea and vomiting)   . Prostate cancer ([place]) 2000  . Shingles   . Shortness of breath   . Sleep apnea    05-02-14 cpap , not yet used- suggested settings 5  . Stroke [facility]) 2013   Speech.   Past Surgical History:  Procedure Laterality Date  . BLADDER SURGERY    . BONE MARROW TRANSPLANT  2011  . CARDIOVERSION N/A 06/02/2019   Procedure: CARDIOVERSION;  Surgeon: Sanda Klein, MD;  Location: [facility] ENDOSCOPY;  Service: Cardiovascular;  Laterality: N/A;  . COLON SURGERY     colon resection  . COLONOSCOPY N/A 01/01/2013   Procedure: COLONOSCOPY;  Surgeon: Rogene Houston, MD;  Location: [facility] ENDO SUITE;  Service: Endoscopy;  Laterality: N/A;  825-moved to [place] notified pt  . COLONOSCOPY N/A 07/16/2018   Procedure: COLONOSCOPY;  Surgeon: Rogene Houston, MD;  Location: [facility] ENDO SUITE;  Service: Endoscopy;  Laterality: N/A;  1:25  . CORONARY ANGIOPLASTY  06/24/2000   PCI and stenting in mid & proximal RCA  . heart stents x 5  1999  . INGUINAL HERNIA REPAIR Right 05/04/2014   Procedure: OPEN RIGHT INGUINAL HERNIA REPAIR with mesh;  Surgeon: Edward Jolly, MD;  Location: [facility] ORS;  Service: General;  Laterality: Right;  . INSERT / REPLACE / REMOVE PACEMAKER    . left ear skin cancer removed    . NM MYOCAR PERF WALL MOTION  11/27/2007   inferior scar  . PACEMAKER INSERTION  07/22/2011   [employer]  . POLYPECTOMY  07/16/2018    Procedure: POLYPECTOMY;  Surgeon: Rogene Houston, MD;  Location: [facility] END[facility];  Service: Endoscopy;;  colon  . PORTACATH PLACEMENT  07/26/2009   right chest  . PROSTATE SURGERY    . Rotator    . ROTATOR CUFF REPAIR Right   . SHOULDER ARTHROSCOPY WITH SUBACROMIAL DECOMPRESSION Right 07/21/2013   Procedure: RIGHT SHOULDER ARTHROSCOPY WITH SUBACROMIAL DECOMPRESSION AND DEBRIDEMENT & Injection of Left Shoulder;  Surgeon: Alta Corning, MD;  Location: [place];  Service: Orthopedics;  Laterality: Right;  . TEE WITHOUT CARDIOVERSION  10/13/2012   Procedure: TRANSESOPHAGEAL ECHOCARDIOGRAM (TEE);  Surgeon: Sanda Klein, MD;  Location: [facility] ENDOSCOPY;  Service: Cardiovascular;  Laterality: N/A;  pat/kay/echo notified  . US ECHOCARDIOGRAPHY  06/19/2011   RV mildly dilated,mild to mod. MR,mild AI,mild PI  . WRIST SURGERY     right     A IV Location/Drains/Wounds Patient Lines/Drains/Airways Status   Active Line/Drains/Airways    Name:   Placement date:   Placement time:   Site:   Days:   Implanted Port Right Chest   --    --    Chest      Peripheral IV 12/09/19 Right Antecubital   12/09/19    0144    Antecubital   less than 1   Incision (Closed) 05/04/14 Abdomen Right   05/04/14    1136     2045   Incision (Closed) 05/04/14 Perineum Other (Comment)   05/04/14    1136     2045          Intake/Output Last 24 hours No intake or output data in the 24 hours ending 12/09/19 0542  Labs/Imaging Results for orders placed or performed during the hospital encounter of 12/09/19 (from the past 48 hour(s))  Comprehensive metabolic panel     Status: Abnormal   Collection Time: 12/09/19  1:30 AM  Result Value Ref Range   Sodium 135 135 - 145 mmol/L   Potassium 4.4 3.5 - 5.1 mmol/L   Chloride 102 98 - 111 mmol/L   CO2 22 22 - 32 mmol/L   Glucose, Bld 128 (H) 70 - 99 mg/dL   BUN 36 (H) 8 - 23 mg/dL   Creatinine, Ser 1.35 (H) 0.61 - 1.24 mg/dL   Calcium 9.6 8.9 - 10.3 mg/dL   Total Protein 7.6 6.5 - 8.1  g/dL   Albumin 3.8 3.5 - 5.0 g/dL   AST 78 (H) 15 - 41 U/L   ALT 70 (H) 0 - 44 U/L   Alkaline Phosphatase 83 38 - 126 U/L   Total Bilirubin 0.9 0.3 - 1.2 mg/dL   GFR calc non Af Amer 50 (L) >60 mL/min   GFR calc Af Amer 58 (L) >60 mL/min   Anion gap 11 5 - 15    Comment: Performed at [facility], [place]  [address] [place], [place] [address]  Brain natriuretic peptide     Status: Abnormal   Collection Time: 12/09/19  1:30 AM  Result Value Ref Range   B Natriuretic Peptide 2,803.0 (H) 0.0 - 100.0 pg/mL    Comment: Performed at [facility], [address] [place], [place] [address]  Troponin I (High Sensitivity)     Status: Abnormal   Collection Time: 12/09/19  1:30 AM  Result Value Ref Range   Troponin I (High Sensitivity) 37 (H) <18 ng/L    Comment: (NOTE) Elevated high sensitivity troponin I (hsTnI) values and significant  changes across serial measurements may suggest ACS but many other  chronic and acute conditions are known to elevate hsTnI results.  Refer to the "Links" section for chest pain algorithms and additional  guidance. Performed at [facility], [address] [place], [place] [address]   CBC with Differential     Status: Abnormal   Collection Time: 12/09/19  1:30 AM  Result Value Ref Range   WBC 7.2 4.0 - 10.5 K/uL   RBC 4.31 4.22 - 5.81 MIL/uL   Hemoglobin 11.9 (L) 13.0 - 17.0 g/dL   HCT 39.4 39.0 - 52.0 %   MCV 91.4 80.0 - 100.0 fL   MCH 27.6 26.0 - 34.0 pg   MCHC 30.2 30.0 - 36.0 g/dL   RDW 18.7 (H) 11.5 - 15.5 %   Platelets 133 (L) 150 - 400 K/uL   nRBC 0.3 (H) 0.0 - 0.2 %   Neutrophils Relative % 74 %   Neutro Abs 5.2 1.7 - 7.7 K/uL   Lymphocytes Relative 18 %   Lymphs Abs 1.3 0.7 - 4.0 K/uL   Monocytes Relative 8 %   Monocytes Absolute 0.6 0.1 - 1.0 K/uL   Eosinophils Relative 0 %   Eosinophils Absolute 0.0 0.0 - 0.5 K/uL   Basophils Relative 0 %   Basophils Absolute 0.0 0.0 - 0.1 K/uL   Immature Granulocytes 0 %   Abs Immature Granulocytes 0.03  0.00 - 0.07 K/uL    Comment: Performed at [facility], [address] [place], [place] [address]  Respiratory Panel by RT PCR (Flu A&B, Covid) - Nasopharyngeal Swab     Status: None   Collection Time: 12/09/19  2:45 AM   Specimen: Nasopharyngeal Swab  Result Value Ref Range   SARS Coronavirus 2 by RT PCR NEGATIVE NEGATIVE    Comment: (NOTE) SARS-CoV-2 target nucleic acids are NOT DETECTED. The SARS-CoV-2 RNA is generally detectable in upper respiratoy specimens during the acute phase of infection. The lowest concentration of SARS-CoV-2 viral copies this assay can detect is 131 copies/mL. A negative result does not preclude SARS-Cov-2 infection and should not be used as the sole basis for treatment or other patient management decisions. A negative result may occur with  improper specimen collection/handling, submission of specimen other than nasopharyngeal swab, presence of viral mutation(s) within the areas targeted by this assay, and inadequate number of viral copies (<131 copies/mL). A negative result must be combined with clinical observations, patient history, and epidemiological information. The expected result is Negative. Fact Sheet for Patients:  PinkCheek.be Fact Sheet for Healthcare Providers:  GravelBags.it This test is not yet ap proved or cleared by the [place] FDA and  has been authorized for detection and/or diagnosis of SARS-CoV-2 by FDA under an Emergency Use Authorization (EUA). This EUA will remain  in effect (meaning this test can be used) for the duration of the COVID-19 declaration under Section 564(b)(1)  of the Act, 21 U.S.C. section 360bbb-3(b)(1), unless the authorization is terminated or revoked sooner.    Influenza A by PCR NEGATIVE NEGATIVE   Influenza B by PCR NEGATIVE NEGATIVE    Comment: (NOTE) The Xpert Xpress SARS-CoV-2/FLU/RSV assay is intended as an aid in  the diagnosis of  influenza from Nasopharyngeal swab specimens and  should not be used as a sole basis for treatment. Nasal washings and  aspirates are unacceptable for Xpert Xpress SARS-CoV-2/FLU/RSV  testing. Fact Sheet for Patients: PinkCheek.be Fact Sheet for Healthcare Providers: GravelBags.it This test is not yet approved or cleared by the [place] FDA and  has been authorized for detection and/or diagnosis of SARS-CoV-2 by  FDA under an Emergency Use Authorization (EUA). This EUA will remain  in effect (meaning this test can be used) for the duration of the  Covid-19 declaration under Section 564(b)(1) of the Act, 21  U.S.C. section 360bbb-3(b)(1), unless the authorization is  terminated or revoked. Performed at [facility], [address] [place], [place] [address]   Troponin I (High Sensitivity)     Status: Abnormal   Collection Time: 12/09/19  3:55 AM  Result Value Ref Range   Troponin I (High Sensitivity) 30 (H) <18 ng/L    Comment: (NOTE) Elevated high sensitivity troponin I (hsTnI) values and significant  changes across serial measurements may suggest ACS but many other  chronic and acute conditions are known to elevate hsTnI results.  Refer to the "Links" section for chest pain algorithms and additional  guidance. Performed at [facility], [address], [place], [place] [address]    *Note: Due to a large number of results and/or encounters for the requested time period, some results have not been displayed. A complete set of results can be found in Results Review.   DG Chest Portable 1 View  Result Date: 12/09/2019 CLINICAL DATA:  Shortness of breath EXAM: PORTABLE CHEST 1 VIEW COMPARISON:  12/06/2019 FINDINGS: Cardiomegaly. Right Port-A-Cath and left pacer remain in place, unchanged. Bibasilar scarring or atelectasis. No confluent opacities, effusions or edema. IMPRESSION: Cardiomegaly.  Bibasilar scarring or atelectasis.  Electronically Signed   By: Rolm Baptise M.D.   On: 12/09/2019 01:57    Pending Labs Unresulted Labs (From admission, onward)    Start     Ordered   12/10/19 8416  Basic metabolic panel  Tomorrow morning,   R     12/09/19 0531   12/10/19 0500  CBC  Tomorrow morning,   R     12/09/19 0531          Vitals/Pain Today's Vitals   12/09/19 0052 12/09/19 0300 12/09/19 0330 12/09/19 0500  BP:  134/87 105/80 (!) 148/98  Pulse:  (!) 122 (!) 46 99  Resp:  (!) 35 (!) 32 (!) 35  Temp:      TempSrc:      SpO2:  100% 99% 96%  Weight: 81.6 kg     Height: 5' 7"  (1.702 m)     PainSc: 0-No pain       Isolation Precautions Airborne and Contact precautions  Medications Medications  diltiazem (CARDIZEM) 1 mg/mL load via infusion 15 mg (15 mg Intravenous Bolus from Bag 12/09/19 0257)    And  diltiazem (CARDIZEM) 125 mg in dextrose 5% 125 mL (1 mg/mL) infusion (10 mg/hr Intravenous Rate/Dose Change 12/09/19 0331)  nitroGLYCERIN (NITROSTAT) SL tablet 0.4 mg (has no administration in time range)  pantoprazole (PROTONIX) EC tablet 40 mg (has no administration in time  range)  loperamide (IMODIUM) capsule 2 mg (has no administration in time range)  clopidogrel (PLAVIX) tablet 75 mg (has no administration in time range)  apixaban (ELIQUIS) tablet 5 mg (has no administration in time range)  lenalidomide (REVLIMID) capsule 10 mg (has no administration in time range)  methocarbamol (ROBAXIN) tablet 750 mg (has no administration in time range)  potassium chloride SA (KLOR-CON) CR tablet 20 mEq (has no administration in time range)  albuterol (VENTOLIN HFA) 108 (90 Base) MCG/ACT inhaler 2 puff (has no administration in time range)  levocetirizine (XYZAL) tablet 5 mg (has no administration in time range)  sodium chloride flush (NS) 0.9 % injection 3 mL (has no administration in time range)  sodium chloride flush (NS) 0.9 % injection 3 mL (has no administration in time range)  0.9 %  sodium chloride  infusion (has no administration in time range)  acetaminophen (TYLENOL) tablet 650 mg (has no administration in time range)    Or  acetaminophen (TYLENOL) suppository 650 mg (has no administration in time range)  traMADol (ULTRAM) tablet 50 mg (has no administration in time range)  polyethylene glycol (MIRALAX / GLYCOLAX) packet 17 g (has no administration in time range)  bisacodyl (DULCOLAX) suppository 10 mg (has no administration in time range)  ondansetron (ZOFRAN) tablet 4 mg (has no administration in time range)    Or  ondansetron (ZOFRAN) injection 4 mg (has no administration in time range)  magnesium sulfate IVPB 2 g 50 mL (has no administration in time range)  simvastatin (ZOCOR) tablet 10 mg (has no administration in time range)  LORazepam (ATIVAN) injection 0.5 mg (0.5 mg Intravenous Given 12/09/19 0253)  furosemide (LASIX) injection 20 mg (20 mg Intravenous Given 12/09/19 0429)    Mobility walks with person assist High fall risk   Focused Assessments    R Recommendations: See Admitting Provider Note  Report given to: Nira Conn RN  Additional Notes:

## 2019-12-09 NOTE — Progress Notes (Addendum)
    Repeat echocardiogram shows his EF is now 30-35% and Dr. Bronson Ing recommends proceeding with cardiac catheterization tomorrow. Risks and benefits reviewed with patient's wife and she consents to the procedure. Hospitalist will arrange transfer.   Signed, Erma Heritage, PA-C 12/09/2019, 11:14 AM Pager: [phone]   Made aware by Dr. Bronson Ing the patient will be on the Cardiology service upon transfer. Card Master updated and [facility] Link aware of need for transfer. Orders entered.   Signed, Erma Heritage, PA-C 12/09/2019, 12:06 PM Pager: [phone]

## 2019-12-09 NOTE — Progress Notes (Addendum)
Pt arrived from [facility] PT C/A/Ox4. Pt given chg bath. Telebox applied/ccmd notified. temp of 95.5 rectal R 30 shallow. Pt has audible congestion. Warm blankets applied, Pt oriented to room and call bell within reach.  Will continue to monitor.  Jerald Kief, RN

## 2019-12-09 NOTE — Consult Note (Addendum)
Cardiology Consult    Patient ID: Jeremy Parrish; [id number]; 1941/08/20   Admit date: 12/09/2019 Date of Consult: 12/09/2019  Primary Care Provider: Celene Squibb, MD Primary Cardiologist: Sanda Klein, MD    Patient Profile    Jeremy Parrish is a 79 y.o. male with past medical history of CAD (s/p prior PCI's by review of notes but no details listed in chart), SSS (s/p PPM placement in 2012), paroxysmal atrial fibrillation (s/p DCCV in 05/2019 - recurrence within 24 hours and rate-control strategy pursued), HTN, HLD, multiple myeloma (followed by Oncology at [facility]), dementia and prior CVA who is being seen today for the evaluation of atrial fibrillation with RVR and elevated troponin values at the request of Dr. Scherrie November.   History of Present Illness    Jeremy Parrish was last examined by Dr. Sallyanne Kuster in 05/2019 and reported having woke up the day prior with a vague episode of chest discomfort.  He reported having baseline dyspnea on exertion in the setting of COPD but denied any acute changes in this.  His device was interrogated and showed that he had been in atrial fibrillation since the following day. He ultimately underwent outpatient DCCV on 06/02/2019 with return to normal sinus rhythm. By review of records, he has canceled follow-up visits since. Most recent device interrogation in 10/2019 showed a 31% AF burden and 10 beats NSVT.  He presented to [facility] ED early this morning for evaluation of worsening shortness of breath. The patient reports having shortness of breath over the past month. He does have dementia at baseline and I spoke with his wife over the phone who reports that he has experienced intermittent episodes of chest pain over the past 2 months which is unusual for him. He has utilized sublingual nitroglycerin with improvement in his symptoms. She reports that he had previously been told he was back in atrial fibrillation but did not report any associated  palpitations.  Several weeks ago, he was started on steroids by his PCP for his worsening dyspnea and the patient's wife reports he has experienced worsening confusion with this. No reports of orthopnea, PND or edema.   Initial labs show WBC 7.2, Hgb 11.9, platelets 133, Na+ 135, K+ 4.4 and creatinine 1.35 (baseline 1.0 - 1.1). BNP 2803. Negative for COVID. Initial HS Troponin 37 with repeat of 30. CXR showed cardiomegaly with bibasilar scarring or possible atelectasis. EKG shows atrial fibrillation with RVR, HR 121 with PVC's and nonspecific IVCD.   He was admitted for atrial fibrillation with RVR and was placed on IV Cardizem.  He was having runs of nonsustained VT while in the ED and received an Amiodarone bolus along with being started on an infusion.  By review of telemetry, he has been having frequent episodes of NSVT up to 14 beats.  Underlying rhythm appears to be atrial fibrillation and rate has improved since admission and is currently in the 60's to 70's.  Past Medical History:  Diagnosis Date  . Anemia   . Aortic aneurysm of unspecified site without mention of rupture   . Arthritis   . Bladder neck contracture   . Cancer ([place])   . Cerebral atherosclerosis    Carotid Doppler, 02/16/2013 - Bilateral Proximal ICAs,demonstrate mild plaque w/o evidence of significant diameter reduction, dissection, or any other vascular abnormality  . CHF (congestive heart failure) ([place])   . Complication of anesthesia   . COPD (chronic obstructive pulmonary disease) ([place])   . Coronary artery  disease   . Depression   . Esophageal reflux   . Heart disease   . Heart murmur   . Hx of bladder cancer 10/07/2012  . Hyperlipidemia   . Hypertension   . 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 Intestinovesical fistula   . Kidney stones    history  . Lung mass   . Multiple myeloma   . Myocardial infarction [facility])    '96  . Non Hodgkin's  lymphoma ([place])   . Paroxysmal atrial fibrillation ([place]) 01/02/2016  . Peripheral arterial disease ([place])   . Personal history of other diseases of circulatory system   . PONV (postoperative nausea and vomiting)   . Prostate cancer ([place]) 2000  . Shingles   . Shortness of breath   . Sleep apnea    05-02-14 cpap , not yet used- suggested settings 5  . Stroke [facility]) 2013   Speech.    Past Surgical History:  Procedure Laterality Date  . BLADDER SURGERY    . BONE MARROW TRANSPLANT  2011  . CARDIOVERSION N/A 06/02/2019   Procedure: CARDIOVERSION;  Surgeon: Sanda Klein, MD;  Location: [facility] ENDOSCOPY;  Service: Cardiovascular;  Laterality: N/A;  . COLON SURGERY     colon resection  . COLONOSCOPY N/A 01/01/2013   Procedure: COLONOSCOPY;  Surgeon: Rogene Houston, MD;  Location: [facility] ENDO SUITE;  Service: Endoscopy;  Laterality: N/A;  825-moved to [place] notified pt  . COLONOSCOPY N/A 07/16/2018   Procedure: COLONOSCOPY;  Surgeon: Rogene Houston, MD;  Location: [facility] ENDO SUITE;  Service: Endoscopy;  Laterality: N/A;  1:25  . CORONARY ANGIOPLASTY  06/24/2000   PCI and stenting in mid & proximal RCA  . heart stents x 5  1999  . INGUINAL HERNIA REPAIR Right 05/04/2014   Procedure: OPEN RIGHT INGUINAL HERNIA REPAIR with mesh;  Surgeon: Edward Jolly, MD;  Location: [facility] ORS;  Service: General;  Laterality: Right;  . INSERT / REPLACE / REMOVE PACEMAKER    . left ear skin cancer removed    . NM MYOCAR PERF WALL MOTION  11/27/2007   inferior scar  . PACEMAKER INSERTION  07/22/2011   [employer]  . POLYPECTOMY  07/16/2018   Procedure: POLYPECTOMY;  Surgeon: Rogene Houston, MD;  Location: [facility] ENDO SUITE;  Service: Endoscopy;;  colon  . PORTACATH PLACEMENT  07/26/2009   right chest  . PROSTATE SURGERY    . Rotator    . ROTATOR CUFF REPAIR Right   . SHOULDER ARTHROSCOPY WITH SUBACROMIAL DECOMPRESSION Right 07/21/2013   Procedure: RIGHT SHOULDER ARTHROSCOPY WITH SUBACROMIAL DECOMPRESSION AND DEBRIDEMENT &  Injection of Left Shoulder;  Surgeon: Alta Corning, MD;  Location: [place];  Service: Orthopedics;  Laterality: Right;  . TEE WITHOUT CARDIOVERSION  10/13/2012   Procedure: TRANSESOPHAGEAL ECHOCARDIOGRAM (TEE);  Surgeon: Sanda Klein, MD;  Location: [facility] ENDOSCOPY;  Service: Cardiovascular;  Laterality: N/A;  pat/kay/echo notified  . US ECHOCARDIOGRAPHY  06/19/2011   RV mildly dilated,mild to mod. MR,mild AI,mild PI  . WRIST SURGERY     right     Home Medications:  Prior to Admission medications   Medication Sig Start Date End Date Taking? Authorizing Provider  albuterol (PROVENTIL HFA;VENTOLIN HFA) 108 (90 Base) MCG/ACT inhaler Inhale 2 puffs into the lungs every 6 (six) hours as needed for wheezing or shortness of breath. 11/17/17   Mikey Kirschner, MD  albuterol (PROVENTIL) (2.5 MG/3ML) 0.083% nebulizer solution Take 2.5 mg  by nebulization every 4 (four) hours as needed for wheezing or shortness of breath.  04/19/16   [provider]  ALPRAZolam Duanne Moron) 0.25 MG tablet Take 0.25 mg by mouth as needed.  06/14/19   [provider]  amLODipine (NORVASC) 5 MG tablet Take 5 mg by mouth daily.    [provider]  clopidogrel (PLAVIX) 75 MG tablet TAKE 1 TABLET BY MOUTH EVERY DAY 07/07/19   Croitoru, Mihai, MD  diltiazem (CARDIZEM CD) 120 MG 24 hr capsule Take 1 capsule (120 mg total) by mouth daily. 05/25/19   Croitoru, Mihai, MD  ELIQUIS 5 MG TABS tablet TAKE 1 TABLET BY MOUTH TWICE A DAY 10/20/19   Croitoru, Mihai, MD  furosemide (LASIX) 20 MG tablet Use sparingly as needed for edema. No more than one tablet by mouth per week. 07/29/17   Lorretta Harp, MD  HYDROcodone-acetaminophen (NORCO/VICODIN) 5-325 MG tablet Take 1 tablet by mouth 2 (two) times daily as needed. for pain 05/21/18   [provider]  ipratropium-albuterol (DUONEB) 0.5-2.5 (3) MG/3ML SOLN Take 3 mLs by nebulization every 6 (six) hours as needed (shortness of breath).  01/27/19   [provider]    lenalidomide (REVLIMID) 10 MG capsule TAKE 1 CAPSULE BY MOUTH  DAILY FOR 7 DAYS ON THEN 7  DAYS OFF THEN REPEAT CYCLE 11/19/19   Derek Jack, MD  levocetirizine (XYZAL) 5 MG tablet Take 5 mg by mouth every evening.     [provider]  loperamide (IMODIUM) 2 MG capsule TAKE 2 CAPSULES BY MOUTH 2 TIMES DAILY AS NEEDED FOR DIARRHEA OR LOOSE STOOLS. Patient not taking: Reported on 10/28/2019 10/22/19   Noralyn Pick, NP  methocarbamol (ROBAXIN) 750 MG tablet Take 750 mg by mouth every 8 (eight) hours as needed for muscle spasms.  02/06/19   [provider]  mometasone (NASONEX) 50 MCG/ACT nasal spray Place 2 sprays into the nose daily as needed (allergies).  06/10/18   [provider]  nitroGLYCERIN (NITROSTAT) 0.4 MG SL tablet PLACE 1 TABLET UNDER THE TONGUE EVERY 5 MINUTES AS NEEDED FOR CHEST PAIN. 08/27/19   Croitoru, Mihai, MD  octreotide (SANDOSTATIN LAR DEPOT) 10 MG injection Inject 10 mg into the muscle every 28 (twenty-eight) days. 06/28/19   Noralyn Pick, NP  omeprazole (PRILOSEC) 20 MG capsule Take 20 mg by mouth daily.    [provider]  ondansetron (ZOFRAN) 4 MG tablet Take 4 mg by mouth every 8 (eight) hours as needed for nausea or vomiting.  04/08/18   [provider]  potassium chloride SA (KLOR-CON M20) 20 MEQ tablet Take 1 tablet (20 mEq total) by mouth 3 (three) times daily. 10/15/19   Lockamy, Randi L, NP-C  simvastatin (ZOCOR) 20 MG tablet Take 1 tablet (20 mg total) by mouth daily at 6 PM. 11/24/19   Croitoru, Mihai, MD    Inpatient Medications: Scheduled Meds: . apixaban  5 mg Oral BID  . clopidogrel  75 mg Oral Daily  . furosemide  40 mg Intravenous Daily  . lenalidomide  10 mg Oral Daily  . loratadine  10 mg Oral Daily  . methylPREDNISolone (SOLU-MEDROL) injection  40 mg Intravenous Q12H  . pantoprazole  40 mg Oral Daily  . potassium chloride SA  20 mEq Oral BID  . simvastatin  10 mg Oral q1800  .  sodium chloride flush  3 mL Intravenous Q12H   Continuous Infusions: . sodium chloride    . amiodarone 60 mg/hr (12/09/19 0741)  Followed by  . amiodarone    . diltiazem (CARDIZEM) infusion 15 mg/hr (12/09/19 0741)   PRN Meds: sodium chloride, acetaminophen **OR** acetaminophen, albuterol, amiodarone, bisacodyl, loperamide, methocarbamol, nitroGLYCERIN, ondansetron **OR** ondansetron (ZOFRAN) IV, polyethylene glycol, sodium chloride flush, traMADol  Allergies:    Allergies  Allergen Reactions  . Morphine And Related Other (See Comments)    hallucinations  . Tape Rash    Paper tape is ok    Social History:   Social History   Socioeconomic History  . Marital status: Married    Spouse name: Jeremy Parrish  . Number of children: 3  . Years of education: 9th  . Highest education level: Not on file  Occupational History  . Occupation: retired   Tobacco Use  . Smoking status: Former Smoker    Packs/day: 1.00    Years: 20.00    Pack years: 20.00    Types: Cigarettes    Quit date: 11/14/1994    Years since quitting: 25.0  . Smokeless tobacco: Never Used  Substance and Sexual Activity  . Alcohol use: No    Alcohol/week: 0.0 standard drinks    Comment: previously drank but none for at least 15 years.  . Drug use: No  . Sexual activity: Not on file  Other Topics Concern  . Not on file  Social History Narrative   Patient lives at home spouse.   Caffeine Use: Occasionally   Social Determinants of Health   Financial Resource Strain:   . Difficulty of Paying Living Expenses: Not on file  Food Insecurity:   . Worried About Charity fundraiser in the Last Year: Not on file  . Ran Out of Food in the Last Year: Not on file  Transportation Needs:   . Lack of Transportation (Medical): Not on file  . Lack of Transportation (Non-Medical): Not on file  Physical Activity:   . Days of Exercise per Week: Not on file  . Minutes of Exercise per Session: Not on file  Stress:   . Feeling  of Stress : Not on file  Social Connections:   . Frequency of Communication with Friends and Family: Not on file  . Frequency of Social Gatherings with Friends and Family: Not on file  . Attends Religious Services: Not on file  . Active Member of Clubs or Organizations: Not on file  . Attends Archivist Meetings: Not on file  . Marital Status: Not on file  Intimate Partner Violence:   . Fear of Current or Ex-Partner: Not on file  . Emotionally Abused: Not on file  . Physically Abused: Not on file  . Sexually Abused: Not on file     Family History:    Family History  Problem Relation Age of Onset  . Cancer Father        bladder  . Heart disease Father        before age 67  . Hypertension Mother   . Cancer Brother   . Heart disease Brother        before age 24  . Heart disease Sister        before age 69  . Hyperlipidemia Sister   . Hypertension Sister   . Heart attack Sister   . Colon cancer Neg Hx   . Colon polyps Neg Hx       Review of Systems    General:  No chills, fever, night sweats or weight changes.  Cardiovascular:  No edema, orthopnea, palpitations, paroxysmal nocturnal  dyspnea. Positive for chest pain and dyspnea on exertion.  Dermatological: No rash, lesions/masses Respiratory: No cough, dyspnea Urologic: No hematuria, dysuria Abdominal:   No nausea, vomiting, diarrhea, bright red blood per rectum, melena, or hematemesis Neurologic:  No visual changes, wkns, changes in mental status. All other systems reviewed and are otherwise negative except as noted above.  Physical Exam/Data    Vitals:   12/09/19 0700 12/09/19 0715 12/09/19 0730 12/09/19 0742  BP: 111/76 98/74 107/86   Pulse: 61 68 (!) 121   Resp: (!) 41 (!) 29 (!) 34   Temp:    98.3 F (36.8 C)  TempSrc:    Oral  SpO2: 97% 95% 93%   Weight:      Height:        Intake/Output Summary (Last 24 hours) at 12/09/2019 0747 Last data filed at 12/09/2019 0741 Gross per 24 hour  Intake  240.44 ml  Output --  Net 240.44 ml   Filed Weights   12/09/19 0052 12/09/19 0545  Weight: 81.6 kg 79.2 kg   Body mass index is 27.35 kg/m.   General: Pleasant male appearing in NAD Psych: Normal affect. Neuro: Alert and oriented X 2 (person and place). Moves all extremities spontaneously. HEENT: Normal  Neck: Supple without bruits or JVD. Lungs:  Resp regular and unlabored, CTA without wheezing or rales. Heart: Irregularly irregular. no s3, s4, or murmurs. Abdomen: Soft, non-tender, non-distended, BS + x 4.  Extremities: No clubbing, cyanosis or edema. DP/PT/Radials 2+ and equal bilaterally.   EKG:  The EKG was personally reviewed and demonstrates: Atrial fibrillation with RVR, HR 121 with PVC's and nonspecific IVCD.    Labs/Studies     Relevant CV Studies:  Echocardiogram: 11/2018 Study Conclusions  - Left ventricle: LVEF is approximately 45%. The cavity size was   normal. Wall thickness was increased in a pattern of mild LVH.   Doppler parameters are consistent with abnormal left ventricular   relaxation (grade 1 diastolic dysfunction). - Aortic valve: There was mild regurgitation. - Mitral valve: There was mild regurgitation. - Left atrium: The atrium was moderately dilated. - Pulmonary arteries: PA peak pressure: 45 mm Hg (S).   Laboratory Data:  Chemistry Recent Labs  Lab 12/09/19 0130  NA 135  K 4.4  CL 102  CO2 22  GLUCOSE 128*  BUN 36*  CREATININE 1.35*  CALCIUM 9.6  GFRNONAA 50*  GFRAA 58*  ANIONGAP 11    Recent Labs  Lab 12/09/19 0130  PROT 7.6  ALBUMIN 3.8  AST 78*  ALT 70*  ALKPHOS 83  BILITOT 0.9   Hematology Recent Labs  Lab 12/09/19 0130  WBC 7.2  RBC 4.31  HGB 11.9*  HCT 39.4  MCV 91.4  MCH 27.6  MCHC 30.2  RDW 18.7*  PLT 133*   Cardiac EnzymesNo results for input(s): TROPONINI in the last 168 hours. No results for input(s): TROPIPOC in the last 168 hours.  BNP Recent Labs  Lab 12/09/19 0130  BNP 2,803.0*     DDimer No results for input(s): DDIMER in the last 168 hours.  Radiology/Studies:  DG Chest 2 View  Result Date: 12/06/2019 CLINICAL DATA:  79 year old male with progressive shortness of breath over the past few days. EXAM: CHEST - 2 VIEW COMPARISON:  Prior chest x-ray 10/12/2018 FINDINGS: Right IJ approach single-lumen power injectable port catheter. Catheter tip overlies the superior cavoatrial junction. Left subclavian approach cardiac rhythm maintenance device. Leads remain in unchanged position projecting over the right atrium and  right ventricle. The appearance of cardiomegaly likely exaggerated secondary to frontal technique. Atherosclerotic calcifications are visible in the transverse aorta. Linear atelectasis versus scarring in the lingula. Chronic bronchitic changes remain stable. Mild hyperinflation. No pleural effusion, pneumothorax or focal airspace consolidation. Bilateral glenohumeral joint osteoarthritis. IMPRESSION: 1. Stable chest x-ray without evidence of acute cardiopulmonary process. 2. Well-positioned right IJ approach single-lumen power injectable port catheter. 3. Stable positioning of cardiac rhythm maintenance device. Electronically Signed   By: Jacqulynn Cadet M.D.   On: 12/06/2019 10:13   DG Chest Portable 1 View  Result Date: 12/09/2019 CLINICAL DATA:  Shortness of breath EXAM: PORTABLE CHEST 1 VIEW COMPARISON:  12/06/2019 FINDINGS: Cardiomegaly. Right Port-A-Cath and left pacer remain in place, unchanged. Bibasilar scarring or atelectasis. No confluent opacities, effusions or edema. IMPRESSION: Cardiomegaly.  Bibasilar scarring or atelectasis. Electronically Signed   By: Rolm Baptise M.D.   On: 12/09/2019 01:57     Assessment & Plan    1. Wide-Complex Tachycardia/Acute CHF Exacerbation (echo pending to determine systolic vs diastolic dysfunction) - This has been occurring intermittently by review of telemetry up to 14 beats at a time. He was started on IV Amiodarone  while in the ED with improvement in his ectopy. In reviewing his strips with Dr. Bronson Ing, there are runs of NSVT and also possible runs of atrial fibrillation with aberrancy with intermittent pacing also seen.  Will arrange for device interrogation by [employer] later today. If it is felt to be most consistent with VT, I have discussed a cardiac catheterization with the wife and she is in agreement for him to proceed with this and will need to provide consent given his underlying dementia. Will check STAT BMET and Mg. Keep K+ ~ 4.0 and Mg ~ 2.0. Echocardiogram has been ordered by the admitting team and is pending. Will re-bolus IV Amiodarone and continue infusion.  - BNP was elevated to 2803 on admission and he has been started on IV Lasix 6m daily. Follow I&O's along with daily weights.   2. Atrial Fibrillation with RVR -Rates initially elevated in the 140's but have improved into the 70's to 80's.  He is currently on IV Cardizem and was on Cardizem CD 1267mdaily prior to admission. Will stop Cardizem at this time given improved rate and unknown EF at this time (previously 45% in 11/2018). Will start BB therapy with Lopressor 12.37m72mID (cardioselective given underlying COPD).  - will stop Eliquis incase a cardiac catheterization is pursued later this admission. Will bridge with Heparin. Patient's wife unsure of his last dose of Eliquis (either yesterday morning or the day prior).   3. History of CAD - s/p prior PCI's by review of notes but no details listed in chart. He denies any chest pain at this time but his wife reports he has reported intermittent episodes over the past 2 months. Echocardiogram pending to assess LV function and wall motion. Would anticipate a cardiac catheterization later this admission pending device interrogation. - continue statin and Plavix. Not on ASA given the need for anticoagulation.   4. SSS - s/p PPM placement in 2012. Followed by Dr. CroSallyanne Kuster an outpatient.  Device interrogation in 10/2019 showed normal device function. Repeat interrogation has been requested.   5. HTN - BP soft at 105/80 - 148/98 since admission. Hold PTA Amlodipine. He should not have been on dual CCB therapy prior to admission as he was listed as being on Amlodipine and Cardizem.    For questions or updates, please  contact [place] Please consult [email or web address] for contact info under Cardiology/STEMI.  Signed, Erma Heritage, PA-C 12/09/2019, 7:47 AM Pager: [phone]   The patient was seen and examined, and I agree with the history, physical exam, assessment and plan as documented above, with modifications as noted below. I have also personally reviewed all relevant documentation, old records, labs, and both radiographic and cardiovascular studies. I have also independently interpreted old and new ECG's.  Briefly, this is a 79 year old male with complex cardiac history as detailed above with previous percutaneous coronary mention, sick sinus syndrome with pacemaker, paroxysmal atrial fibrillation, multiple myeloma, prior CVA, and dementia.  EMR review indicates patient underwent direct-current cardioversion on 06/02/2019 for symptomatic atrial fibrillation with complaints of chest discomfort at that time.  He presented to the ED today with worsening shortness of breath over the past month.  He is also had chest pain alleviated with nitroglycerin.  Labs reviewed above with elevated BNP and insignificantly elevated troponins.  ECG which appears review demonstrates rapid atrial fibrillation with PVCs and nonspecific IVCD.  He was started on IV diltiazem by internal medicine.  He has been having runs of nonsustained ventricular tachycardia as well as possible atrial fibrillation with aberrant conduction and intermittent pacing noted.  At the time my evaluation he was in rate controlled atrial fibrillation with heart rate in the 70 bpm range.  An echocardiogram has  been ordered and is pending.  Currently on IV amiodarone.  Given reduced LV systolic function based upon prior cardiogram, will stop and start low-dose beta-blocker with metoprolol tartrate 12.5 mg twice daily.  Given possible need for cardiac catheterization we will stop Eliquis and start IV heparin.  Magnesium and potassium levels are being checked and are pending.  We have requested [employer] to interrogate pacemaker.  Given multiple runs of nonsustained ventricular tachycardia and complaints of chest pain, he may require cardiac catheterization.  Consent has been obtained from his wife who is his power of attorney.   Kate Sable, MD, [facility]  Time spent: 45 minutes  12/09/2019 9:54 AM

## 2019-12-10 ENCOUNTER — Inpatient Hospital Stay (HOSPITAL_COMMUNITY): Payer: Medicare Other

## 2019-12-10 ENCOUNTER — Encounter (HOSPITAL_COMMUNITY)
Admission: EM | Disposition: A | Discharge status: Home / Self Care | Payer: Self-pay | Source: Home / Self Care | Attending: Pulmonary Disease

## 2019-12-10 DIAGNOSIS — R570 Cardiogenic shock: Secondary | ICD-10-CM

## 2019-12-10 DIAGNOSIS — J9601 Acute respiratory failure with hypoxia: Secondary | ICD-10-CM

## 2019-12-10 LAB — POCT I-STAT 7, (LYTES, BLD GAS, ICA,H+H)
Acid-Base Excess: 3 mmol/L — ABNORMAL HIGH (ref 0.0–2.0)
Acid-base deficit: 22 mmol/L — ABNORMAL HIGH (ref 0.0–2.0)
Acid-base deficit: 19 mmol/L — ABNORMAL HIGH (ref 0.0–2.0)
Acid-base deficit: 19 mmol/L — ABNORMAL HIGH (ref 0.0–2.0)
Bicarbonate: 7.4 mmol/L — ABNORMAL LOW (ref 20.0–28.0)
Bicarbonate: 8.2 mmol/L — ABNORMAL LOW (ref 20.0–28.0)
Bicarbonate: 8.2 mmol/L — ABNORMAL LOW (ref 20.0–28.0)
Bicarbonate: 26.8 mmol/L (ref 20.0–28.0)
Calcium, Ion: 1 mmol/L — ABNORMAL LOW (ref 1.15–1.40)
Calcium, Ion: 0.95 mmol/L — ABNORMAL LOW (ref 1.15–1.40)
Calcium, Ion: 0.93 mmol/L — ABNORMAL LOW (ref 1.15–1.40)
Calcium, Ion: 0.91 mmol/L — ABNORMAL LOW (ref 1.15–1.40)
HCT: 38 % — ABNORMAL LOW (ref 39.0–52.0)
HCT: 34 % — ABNORMAL LOW (ref 39.0–52.0)
HCT: 33 % — ABNORMAL LOW (ref 39.0–52.0)
HCT: 35 % — ABNORMAL LOW (ref 39.0–52.0)
Hemoglobin: 12.9 g/dL — ABNORMAL LOW (ref 13.0–17.0)
Hemoglobin: 11.6 g/dL — ABNORMAL LOW (ref 13.0–17.0)
Hemoglobin: 11.2 g/dL — ABNORMAL LOW (ref 13.0–17.0)
Hemoglobin: 11.9 g/dL — ABNORMAL LOW (ref 13.0–17.0)
O2 Saturation: 100 %
O2 Saturation: 100 %
O2 Saturation: 99 %
O2 Saturation: 95 %
Patient temperature: 94
Patient temperature: 93.4
Patient temperature: 35.4
Patient temperature: 37.1
Potassium: 5.2 mmol/L — ABNORMAL HIGH (ref 3.5–5.1)
Potassium: 4.5 mmol/L (ref 3.5–5.1)
Potassium: 3.7 mmol/L (ref 3.5–5.1)
Potassium: 3.4 mmol/L — ABNORMAL LOW (ref 3.5–5.1)
Sodium: 135 mmol/L (ref 135–145)
Sodium: 138 mmol/L (ref 135–145)
Sodium: 140 mmol/L (ref 135–145)
Sodium: 138 mmol/L (ref 135–145)
TCO2: 8 mmol/L — ABNORMAL LOW (ref 22–32)
TCO2: 9 mmol/L — ABNORMAL LOW (ref 22–32)
TCO2: 9 mmol/L — ABNORMAL LOW (ref 22–32)
TCO2: 28 mmol/L (ref 22–32)
pCO2 arterial: 23.1 mmHg — ABNORMAL LOW (ref 32.0–48.0)
pCO2 arterial: 20.6 mmHg — ABNORMAL LOW (ref 32.0–48.0)
pCO2 arterial: 21.7 mmHg — ABNORMAL LOW (ref 32.0–48.0)
pCO2 arterial: 35.7 mmHg (ref 32.0–48.0)
pH, Arterial: 7.094 — CL (ref 7.350–7.450)
pH, Arterial: 7.193 — CL (ref 7.350–7.450)
pH, Arterial: 7.176 — CL (ref 7.350–7.450)
pH, Arterial: 7.485 — ABNORMAL HIGH (ref 7.350–7.450)
pO2, Arterial: 463 mmHg — ABNORMAL HIGH (ref 83.0–108.0)
pO2, Arterial: 240 mmHg — ABNORMAL HIGH (ref 83.0–108.0)
pO2, Arterial: 149 mmHg — ABNORMAL HIGH (ref 83.0–108.0)
pO2, Arterial: 69 mmHg — ABNORMAL LOW (ref 83.0–108.0)

## 2019-12-10 LAB — BASIC METABOLIC PANEL
Anion gap: 34 — ABNORMAL HIGH (ref 5–15)
BUN: 44 mg/dL — ABNORMAL HIGH (ref 8–23)
CO2: 9 mmol/L — ABNORMAL LOW (ref 22–32)
Calcium: 7.7 mg/dL — ABNORMAL LOW (ref 8.9–10.3)
Chloride: 97 mmol/L — ABNORMAL LOW (ref 98–111)
Creatinine, Ser: 2.7 mg/dL — ABNORMAL HIGH (ref 0.61–1.24)
GFR calc Af Amer: 25 mL/min — ABNORMAL LOW (ref >60)
GFR calc non Af Amer: 22 mL/min — ABNORMAL LOW (ref >60)
Glucose, Bld: 206 mg/dL — ABNORMAL HIGH (ref 70–99)
Potassium: 3.9 mmol/L (ref 3.5–5.1)
Sodium: 140 mmol/L (ref 135–145)

## 2019-12-10 LAB — COOXEMETRY PANEL
Carboxyhemoglobin: 0 % — ABNORMAL LOW (ref 0.5–1.5)
Carboxyhemoglobin: 0.4 % — ABNORMAL LOW (ref 0.5–1.5)
Methemoglobin: 0.8 % (ref 0.0–1.5)
Methemoglobin: 0.7 % (ref 0.0–1.5)
O2 Saturation: 78.1 %
O2 Saturation: 87.2 %
Total hemoglobin: 12.1 g/dL (ref 12.0–16.0)
Total hemoglobin: 10.4 g/dL — ABNORMAL LOW (ref 12.0–16.0)

## 2019-12-10 LAB — MRSA PCR SCREENING
MRSA by PCR: NEGATIVE
MRSA by PCR: NEGATIVE

## 2019-12-10 LAB — APTT: aPTT: 43 seconds — ABNORMAL HIGH (ref 24–36)

## 2019-12-10 LAB — URINALYSIS, ROUTINE W REFLEX MICROSCOPIC
Bilirubin Urine: NEGATIVE
Glucose, UA: >=500 mg/dL — AB
Ketones, ur: NEGATIVE mg/dL
Leukocytes,Ua: NEGATIVE
Nitrite: NEGATIVE
Protein, ur: NEGATIVE mg/dL
Specific Gravity, Urine: 1.005 (ref 1.005–1.030)
pH: 8 (ref 5.0–8.0)

## 2019-12-10 LAB — LACTIC ACID, PLASMA
Lactic Acid, Venous: >11 mmol/L (ref 0.5–1.9)
Lactic Acid, Venous: >11 mmol/L (ref 0.5–1.9)
Lactic Acid, Venous: 9.8 mmol/L (ref 0.5–1.9)

## 2019-12-10 LAB — PHOSPHORUS
Phosphorus: 4.7 mg/dL — ABNORMAL HIGH (ref 2.5–4.6)
Phosphorus: 4.5 mg/dL (ref 2.5–4.6)

## 2019-12-10 LAB — MAGNESIUM
Magnesium: 2.2 mg/dL (ref 1.7–2.4)
Magnesium: 2.1 mg/dL (ref 1.7–2.4)
Magnesium: 2.1 mg/dL (ref 1.7–2.4)

## 2019-12-10 LAB — HEPATIC FUNCTION PANEL
ALT: 2140 U/L — ABNORMAL HIGH (ref 0–44)
AST: 3286 U/L — ABNORMAL HIGH (ref 15–41)
Albumin: 2.6 g/dL — ABNORMAL LOW (ref 3.5–5.0)
Alkaline Phosphatase: 77 U/L (ref 38–126)
Bilirubin, Direct: 1.1 mg/dL — ABNORMAL HIGH (ref 0.0–0.2)
Indirect Bilirubin: 1.4 mg/dL — ABNORMAL HIGH (ref 0.3–0.9)
Total Bilirubin: 2.5 mg/dL — ABNORMAL HIGH (ref 0.3–1.2)
Total Protein: 5.2 g/dL — ABNORMAL LOW (ref 6.5–8.1)

## 2019-12-10 LAB — HEMOGLOBIN A1C
Hgb A1c MFr Bld: 5.6 % (ref 4.8–5.6)
Mean Plasma Glucose: 114.02 mg/dL

## 2019-12-10 LAB — HEPARIN LEVEL (UNFRACTIONATED): Heparin Unfractionated: 0.44 IU/mL (ref 0.30–0.70)

## 2019-12-10 LAB — GLUCOSE, CAPILLARY: Glucose-Capillary: 313 mg/dL — ABNORMAL HIGH (ref 70–99)

## 2019-12-10 SURGERY — RIGHT/LEFT HEART CATH AND CORONARY ANGIOGRAPHY
Anesthesia: LOCAL

## 2019-12-10 MED ORDER — FENTANYL BOLUS VIA INFUSION
25.0000 ug | INTRAVENOUS | Status: DC | PRN
  Filled 2019-12-10: qty 25

## 2019-12-10 MED ORDER — MIDAZOLAM 50MG/50ML (1MG/ML) PREMIX INFUSION
0.5000 mg/h | INTRAVENOUS | Status: DC
  Administered 2019-12-10 (×2): 0.5 mg/h via INTRAVENOUS
  Filled 2019-12-10 (×2): qty 50

## 2019-12-10 MED ORDER — AMIODARONE HCL IN DEXTROSE 360-4.14 MG/200ML-% IV SOLN
INTRAVENOUS | Status: AC
  Filled 2019-12-10: qty 200

## 2019-12-10 MED ORDER — SODIUM BICARBONATE-DEXTROSE 150-5 MEQ/L-% IV SOLN
150.0000 meq | INTRAVENOUS | Status: DC
  Administered 2019-12-10 – 2019-12-11 (×5): 150 meq via INTRAVENOUS
  Filled 2019-12-10 (×5): qty 1000

## 2019-12-10 MED ORDER — PANTOPRAZOLE SODIUM 40 MG IV SOLR
40.0000 mg | Freq: Two times a day (BID) | INTRAVENOUS | Status: DC
  Administered 2019-12-10 – 2019-12-14 (×10): 40 mg via INTRAVENOUS
  Filled 2019-12-10 (×10): qty 40

## 2019-12-10 MED ORDER — CALCIUM GLUCONATE-NACL 1-0.675 GM/50ML-% IV SOLN
1.0000 g | Freq: Once | INTRAVENOUS | Status: AC
  Administered 2019-12-10: 1000 mg via INTRAVENOUS
  Filled 2019-12-10: qty 50

## 2019-12-10 MED ORDER — SODIUM BICARBONATE 8.4 % IV SOLN
INTRAVENOUS | Status: AC
  Administered 2019-12-10: 100 meq via INTRAVENOUS
  Filled 2019-12-10: qty 100

## 2019-12-10 MED ORDER — SODIUM CHLORIDE 0.9% FLUSH
10.0000 mL | INTRAVENOUS | Status: DC | PRN
  Administered 2019-12-13: 10 mL

## 2019-12-10 MED ORDER — MIDAZOLAM BOLUS VIA INFUSION
1.0000 mg | INTRAVENOUS | Status: DC | PRN
  Filled 2019-12-10: qty 1

## 2019-12-10 MED ORDER — HEPARIN SOD (PORK) LOCK FLUSH 100 UNIT/ML IV SOLN
500.0000 [IU] | INTRAVENOUS | Status: AC | PRN
  Administered 2019-12-10: 500 [IU]
  Filled 2019-12-10: qty 5

## 2019-12-10 MED ORDER — VANCOMYCIN HCL 1500 MG/300ML IV SOLN
1500.0000 mg | Freq: Once | INTRAVENOUS | Status: AC
  Administered 2019-12-10: 1500 mg via INTRAVENOUS
  Filled 2019-12-10: qty 300

## 2019-12-10 MED ORDER — PANTOPRAZOLE SODIUM 40 MG IV SOLR
40.0000 mg | INTRAVENOUS | Status: DC
  Administered 2019-12-10: 40 mg via INTRAVENOUS
  Filled 2019-12-10: qty 40

## 2019-12-10 MED ORDER — ETOMIDATE 2 MG/ML IV SOLN
INTRAVENOUS | Status: DC | PRN
  Administered 2019-12-10: 8 mg via INTRAVENOUS

## 2019-12-10 MED ORDER — AMIODARONE IV BOLUS ONLY 150 MG/100ML
150.0000 mg | Freq: Once | INTRAVENOUS | Status: AC
  Administered 2019-12-10: 150 mg via INTRAVENOUS
  Filled 2019-12-10: qty 100

## 2019-12-10 MED ORDER — SODIUM BICARBONATE 8.4 % IV SOLN: INTRAVENOUS | Status: DC

## 2019-12-10 MED ORDER — SODIUM CHLORIDE 0.9% FLUSH: 10.0000 mL | Freq: Two times a day (BID) | INTRAVENOUS | Status: DC

## 2019-12-10 MED ORDER — SODIUM CHLORIDE 0.9 % IV SOLN
2.0000 g | INTRAVENOUS | Status: DC
  Administered 2019-12-10 – 2019-12-12 (×3): 2 g via INTRAVENOUS
  Filled 2019-12-10 (×3): qty 2

## 2019-12-10 MED ORDER — SODIUM BICARBONATE 8.4 % IV SOLN
100.0000 meq | Freq: Once | INTRAVENOUS | Status: AC
  Filled 2019-12-10: qty 100

## 2019-12-10 MED ORDER — DEXTROSE 50 % IV SOLN
50.0000 mL | Freq: Once | INTRAVENOUS | Status: DC
  Filled 2019-12-10: qty 50

## 2019-12-10 MED ORDER — CHLORHEXIDINE GLUCONATE 0.12% ORAL RINSE (MEDLINE KIT)
15.0000 mL | Freq: Two times a day (BID) | OROMUCOSAL | Status: DC
  Administered 2019-12-10 – 2019-12-27 (×32): 15 mL via OROMUCOSAL

## 2019-12-10 MED ORDER — VANCOMYCIN VARIABLE DOSE PER UNSTABLE RENAL FUNCTION (PHARMACIST DOSING): Status: DC

## 2019-12-10 MED ORDER — FENTANYL 2500MCG IN NS 250ML (10MCG/ML) PREMIX INFUSION
0.0000 ug/h | INTRAVENOUS | Status: DC
  Administered 2019-12-10: 25 ug/h via INTRAVENOUS
  Filled 2019-12-10 (×2): qty 250

## 2019-12-10 MED ORDER — HYDROCORTISONE NA SUCCINATE PF 100 MG IJ SOLR
50.0000 mg | Freq: Four times a day (QID) | INTRAMUSCULAR | Status: AC
  Administered 2019-12-10 – 2019-12-15 (×20): 50 mg via INTRAVENOUS
  Filled 2019-12-10 (×20): qty 2

## 2019-12-10 MED ORDER — SODIUM BICARBONATE 8.4 % IV SOLN: 100.0000 meq | Freq: Once | INTRAVENOUS | Status: AC

## 2019-12-10 MED ORDER — EPHEDRINE SULFATE 50 MG/ML IJ SOLN
INTRAMUSCULAR | Status: DC | PRN
  Administered 2019-12-10: 5 mg via INTRAVENOUS

## 2019-12-10 MED ORDER — HEPARIN SODIUM (PORCINE) 5000 UNIT/ML IJ SOLN
5000.0000 [IU] | Freq: Three times a day (TID) | INTRAMUSCULAR | Status: DC
  Administered 2019-12-10 – 2019-12-17 (×21): 5000 [IU] via SUBCUTANEOUS
  Filled 2019-12-10 (×21): qty 1

## 2019-12-10 MED ORDER — EPINEPHRINE 1 MG/10ML IJ SOSY: 1.0000 mg | PREFILLED_SYRINGE | Freq: Once | INTRAMUSCULAR | Status: DC

## 2019-12-10 MED ORDER — SODIUM BICARBONATE-DEXTROSE 150-5 MEQ/L-% IV SOLN
150.0000 meq | INTRAVENOUS | Status: DC
  Administered 2019-12-10: 150 meq via INTRAVENOUS
  Filled 2019-12-10: qty 1000

## 2019-12-10 MED ORDER — VASOPRESSIN 20 UNIT/ML IV SOLN
0.0100 [IU]/min | INTRAVENOUS | Status: DC
  Administered 2019-12-10 – 2019-12-11 (×3): 0.04 [IU]/min via INTRAVENOUS
  Filled 2019-12-10 (×5): qty 2

## 2019-12-10 MED ORDER — VITAL AF 1.2 CAL PO LIQD
1000.0000 mL | ORAL | Status: DC
  Administered 2019-12-10 – 2019-12-13 (×3): 1000 mL

## 2019-12-10 MED ORDER — VANCOMYCIN HCL 1500 MG/300ML IV SOLN: 1500.0000 mg | INTRAVENOUS | Status: DC

## 2019-12-10 MED ORDER — NOREPINEPHRINE 16 MG/250ML-% IV SOLN
0.0000 ug/min | INTRAVENOUS | Status: DC
  Administered 2019-12-10: 25 ug/min via INTRAVENOUS
  Administered 2019-12-11: 7 ug/min via INTRAVENOUS
  Filled 2019-12-10 (×3): qty 250

## 2019-12-10 MED ORDER — FENTANYL CITRATE (PF) 100 MCG/2ML IJ SOLN: 25.0000 ug | INTRAMUSCULAR | Status: DC | PRN

## 2019-12-10 MED ORDER — PRO-STAT SUGAR FREE PO LIQD
30.0000 mL | Freq: Two times a day (BID) | ORAL | Status: DC
  Administered 2019-12-10 – 2019-12-16 (×13): 30 mL
  Filled 2019-12-10 (×13): qty 30

## 2019-12-10 MED ORDER — HEPARIN (PORCINE) 25000 UT/250ML-% IV SOLN: 1000.0000 [IU]/h | INTRAVENOUS | Status: DC

## 2019-12-10 MED ORDER — EPINEPHRINE PF 1 MG/ML IJ SOLN
1.0000 ug/min | INTRAVENOUS | Status: DC
  Administered 2019-12-10 (×2): 1 ug/min via INTRAVENOUS
  Filled 2019-12-10 (×2): qty 4

## 2019-12-10 MED ORDER — INSULIN ASPART 100 UNIT/ML ~~LOC~~ SOLN
0.0000 [IU] | SUBCUTANEOUS | Status: DC
  Administered 2019-12-10: 11 [IU] via SUBCUTANEOUS
  Administered 2019-12-11 (×4): 3 [IU] via SUBCUTANEOUS
  Administered 2019-12-11: 5 [IU] via SUBCUTANEOUS
  Administered 2019-12-11 – 2019-12-12 (×3): 3 [IU] via SUBCUTANEOUS
  Administered 2019-12-12: 5 [IU] via SUBCUTANEOUS
  Administered 2019-12-12: 3 [IU] via SUBCUTANEOUS
  Administered 2019-12-12: 2 [IU] via SUBCUTANEOUS
  Administered 2019-12-12: 3 [IU] via SUBCUTANEOUS
  Administered 2019-12-13: 2 [IU] via SUBCUTANEOUS
  Administered 2019-12-13 (×2): 3 [IU] via SUBCUTANEOUS
  Administered 2019-12-13: 04:00:00 2 [IU] via SUBCUTANEOUS
  Administered 2019-12-13 – 2019-12-14 (×2): 3 [IU] via SUBCUTANEOUS
  Administered 2019-12-14: 5 [IU] via SUBCUTANEOUS
  Administered 2019-12-14: 21:00:00 3 [IU] via SUBCUTANEOUS
  Administered 2019-12-14: 01:00:00 5 [IU] via SUBCUTANEOUS
  Administered 2019-12-14 – 2019-12-15 (×6): 3 [IU] via SUBCUTANEOUS
  Administered 2019-12-15: 5 [IU] via SUBCUTANEOUS
  Administered 2019-12-16: 2 [IU] via SUBCUTANEOUS
  Administered 2019-12-16 (×3): 3 [IU] via SUBCUTANEOUS
  Administered 2019-12-17: 04:00:00 2 [IU] via SUBCUTANEOUS
  Administered 2019-12-17: 3 [IU] via SUBCUTANEOUS
  Administered 2019-12-17: 2 [IU] via SUBCUTANEOUS
  Administered 2019-12-17 – 2019-12-18 (×3): 3 [IU] via SUBCUTANEOUS
  Administered 2019-12-18: 15:00:00 8 [IU] via SUBCUTANEOUS
  Administered 2019-12-18: 12:00:00 2 [IU] via SUBCUTANEOUS
  Administered 2019-12-18 – 2019-12-19 (×3): 3 [IU] via SUBCUTANEOUS

## 2019-12-10 MED ORDER — ORAL CARE MOUTH RINSE
15.0000 mL | OROMUCOSAL | Status: DC
  Administered 2019-12-10 – 2019-12-18 (×77): 15 mL via OROMUCOSAL

## 2019-12-10 MED ORDER — SODIUM BICARBONATE 8.4 % IV SOLN
100.0000 meq | Freq: Once | INTRAVENOUS | Status: AC
  Administered 2019-12-10: 100 meq via INTRAVENOUS

## 2019-12-10 MED ORDER — SUCCINYLCHOLINE CHLORIDE 20 MG/ML IJ SOLN
INTRAMUSCULAR | Status: DC | PRN
  Administered 2019-12-10: 120 mg via INTRAVENOUS

## 2019-12-10 NOTE — Progress Notes (Signed)
Pharmacy Antibiotic Note  Jeremy Parrish is a 79 y.o. male admitted on 12/09/2019 with respiratory failure.  Pharmacy has been consulted for Vancomycin/Cefepime dosing for r/o sepsis. WBC ok, renal function worsening.   Plan: Vancomycin 1500 mg IV q48h >>Estimated AUC: 491 Cefepime 2g IV q24h Trend WBC, temp, renal function  F/U infectious work-up Drug levels as indicated   Height: 5\' 7"  (170.2 cm) Weight: 174 lb 9.7 oz (79.2 kg) IBW/kg (Calculated) : 66.1  Temp (24hrs), Avg:96 F (35.6 C), Min:93.2 F (34 C), Max:98.3 F (36.8 C)  Recent Labs  Lab 12/09/19 0130 12/09/19 0930 12/09/19 2150 12/09/19 2151 12/10/19 0103  WBC 7.2  --   --   --   --   CREATININE 1.35* 1.48* 2.14*  --   --   LATICACIDVEN  --   --   --  >11.0* >11.0*    Estimated Creatinine Clearance: 27 mL/min (A) (by C-G formula based on SCr of 2.14 mg/dL (H)).    Allergies  Allergen Reactions  . Morphine And Related Other (See Comments)    hallucinations  . Tape Rash    Paper tape is ok   Narda Bonds, PharmD, [place] Clinical Pharmacist Phone: [phone]

## 2019-12-10 NOTE — Consult Note (Signed)
NAME:  Jeremy Parrish, MRN:  [id number], DOB:  10/29/1941, LOS: 1 ADMISSION DATE:  12/09/2019, CONSULTATION DATE:  12/09/18 REFERRING MD:  Bronson Ing  CHIEF COMPLAINT:  Vent Management   Brief History   Jeremy Parrish is a 79 y.o. male who was transferred from [facility] 1/28 with A.Fib RVR.  Later that night, developed shock and respiratory failure requiring intubation.  Cards has started him on levo and dobutamine.  Planning for cath possibly 1/29 if stabilized.  History of present illness   Pt is encephelopathic; therefore, this HPI is obtained from chart review. MADEX Parrish is a 79 y.o. male who has a PMH including but not limited to CAD, SSS, PAF, HTN, HLD, MM (followed by oncology at [facility]), dementia, prior CVA (see "past medical history" for rest).  He presented to [facility] on 1/28 with dyspnea and  A.fib RVR.  He was started on cardizem as well as amio bolus and infusion due to runs of NSVT.  He had echo which showed new EF of 30-35%.  Cardiology was consulted and recommended transfer to [facility] for cardiac cath the following day.  After arrival to [facility], he unfortunately deteriorated and developed cardiogenic shock and respiratory failure.  He was intubated by anesthesia.  He was started on dobutamine and levophed by cards and PCCM was asked to see in consultation for vent management.   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; Hx of bladder cancer; Hyperlipidemia with target LDL less than 100; Prediabetes;  Expressive aphasia; Hemiplegia affecting right dominant side ([place]); Diarrhea; Aneurysm of iliac artery ([place]); Shortness of breath; Rotator cuff tear arthropathy of right shoulder; Left rotator cuff tear arthropathy; Muscle weakness (generalized); Status post arthroscopy of shoulder; Pain in joint, shoulder region; Fever; Pneumonia; Pancytopenia ([place]); Chest pain with moderate risk for cardiac etiology; Inguinal hernia; Obstructive sleep apnea; Central sleep apnea; Hypokalemia; URI (upper respiratory infection); Weakness; Paroxysmal atrial fibrillation (HCC); COPD (chronic obstructive pulmonary disease) ([place]); Long term current use of anticoagulant; Vitamin B12 deficiency; CAP (community acquired pneumonia); COPD with acute exacerbation ([place]); History of colonic polyps; Other persistent atrial fibrillation ([place]); Abdominal bloating; Acute exacerbation of CHF (congestive heart failure) ([place]); and Atrial fibrillation with RVR ([place]) on their problem list.  Significant Hospital Events   1/28 > admit. 1/29 > intubated, cardiogenic shock.  Consults:  PCCM.  Procedures:  ETT 1/29 >  R radial art line 1/29 >  CVL pending 1/29 >   Significant Diagnostic Tests:  Echo 1/28 > EF 30-35%, mild LVH, mildly reduced RV systolic function.  Severely dilated LA, mod dilated RA.  Micro Data:  SARS CoV2 1/28 > neg. Flu 1/28 > neg.  Antimicrobials:  None.   Interim history/subjective:  Just intubated by anesthesia.  Objective:  Blood pressure 92/76, pulse (!) 44, temperature (!) 95.2 F (35.1 C), temperature source Rectal, resp. rate (!) 23, height 5' 7" (1.702 m), weight 79.2 kg, SpO2 (!) 75 %.        Intake/Output Summary (Last 24 hours) at 12/10/2019 0004 Last data filed at 12/09/2019 1700 Gross per 24 hour  Intake 623.94 ml  Output 500 ml  Net 123.94  ml   Filed Weights   12/09/19 0052 12/09/19 0545  Weight: 81.6 kg 79.2 kg    Examination: General: Adult male, critically ill. Neuro: Sedated,  not responsive. HEENT: [place]/AT. Sclerae anicteric.  ETT in place. Cardiovascular: IRIR, no M/R/G.  Lungs: Respirations even and unlabored.  CTA bilaterally, No W/R/R.  Abdomen: BS x 4, soft, NT/ND.  Musculoskeletal: No gross deformities, no edema.  Skin:Mottled LE's, skin cool.  Assessment & Plan:   Respiratory insufficieny - s/p intubation by anesthesia 1/29. - Full vent support. - Assess ABG and wean as able. - Bronchial hygiene. - Follow CXR.  Cardiogenic shock - Echo with new EF 30-35%. - Continue levophed and dobutamine per cards. - Follow Co-ox, CVP's.  A.fib with RVR (on eliquis at baseline). NSVT. CHF exacerbation. Hx CAD, SSS (s/p PPM 2012), HTN. - Continue amiodarone, heparin. - Cards following / managing - planning for cardiac cath possibly 1/29.  Metabolic acidosis. Mild hyperkalemia - presumed 2/2 acidosis. - 2 amps HCO3.  - Follow BMP.  Transaminitis - presumed 2/2 shock. - Trend LFT's.  Hx MM - followed at [facility]. - Follow up with [facility].  Best Practice:  Diet: NPO. Pain/Anxiety/Delirium protocol (if indicated): fentanyl gtt / midazolam gtt.  RASS goal -1. VAP protocol (if indicated): In place. DVT prophylaxis: SCD's / Heparin gtt. GI prophylaxis: PPI. Glucose control: SSI if glucose consistently > 180. Mobility: Bedrest. Code Status: Full. Family Communication: Per primary. Disposition: ICU.  Labs   CBC: Recent Labs  Lab 12/09/19 0130  WBC 7.2  NEUTROABS 5.2  HGB 11.9*  HCT 39.4  MCV 91.4  PLT 678*   Basic Metabolic Panel: Recent Labs  Lab 12/09/19 0130 12/09/19 0930 12/09/19 2150  NA 135 134* 136  K 4.4 4.2 5.8*  CL 102 101 101  CO2 22 19* 10*  GLUCOSE 128* 150* 115*  BUN 36* 38* 40*  CREATININE 1.35* 1.48* 2.14*  CALCIUM 9.6 9.3 9.2  MG  --  2.5*  --    GFR: Estimated Creatinine Clearance: 27 mL/min (A) (by C-G formula based on SCr of 2.14 mg/dL (H)). Recent Labs  Lab 12/09/19 0130 12/09/19 2151  WBC 7.2  --     LATICACIDVEN  --  >11.0*   Liver Function Tests: Recent Labs  Lab 12/09/19 0130 12/09/19 2150  AST 78* 796*  ALT 70* 694*  ALKPHOS 83 75  BILITOT 0.9 2.2*  PROT 7.6 7.0  ALBUMIN 3.8 3.3*   No results for input(s): LIPASE, AMYLASE in the last 168 hours. No results for input(s): AMMONIA in the last 168 hours. ABG    Component Value Date/Time   PHART 7.197 (LL) 12/09/2019 2203   PCO2ART 21.7 (L) 12/09/2019 2203   PO2ART 169 (H) 12/09/2019 2203   HCO3 8.1 (L) 12/09/2019 2203   TCO2 25 10/06/2012 1630   ACIDBASEDEF 18.5 (H) 12/09/2019 2203   O2SAT 98.1 12/09/2019 2203    Coagulation Profile: Recent Labs  Lab 12/09/19 0930  INR 1.7*   Cardiac Enzymes: No results for input(s): CKTOTAL, CKMB, CKMBINDEX, TROPONINI in the last 168 hours. HbA1C: Hgb A1c MFr Bld  Date/Time Value Ref Range Status  01/08/2019 10:57 AM 5.6 4.8 - 5.6 % Final    Comment:    (NOTE) Pre diabetes:          5.7%-6.4% Diabetes:              >6.4% Glycemic control for   <7.0% adults with diabetes   12/15/2017 07:57 AM 5.3 4.8 -  5.6 % Final    Comment:    (NOTE) Pre diabetes:          5.7%-6.4% Diabetes:              >6.4% Glycemic control for   <7.0% adults with diabetes    CBG: No results for input(s): GLUCAP in the last 168 hours.  Review of Systems:   Unable to obtain as pt is encephalopathic.  Past medical history  He,  has a past medical history of Anemia, Aortic aneurysm of unspecified site without mention of rupture, Arthritis, Bladder neck contracture, Cancer ([place]), Cerebral atherosclerosis, CHF (congestive heart failure) ([place]), Complication of anesthesia, COPD (chronic obstructive pulmonary disease) ([place]), Coronary artery disease, Depression, Esophageal reflux, Heart disease, Heart murmur, bladder cancer (10/07/2012), Hyperlipidemia, Hypertension, Hypogammaglobulinemia ([place]) (09/28/2012), Intestinovesical fistula, Kidney stones, Lung mass, Multiple myeloma, Myocardial infarction  ([place]), Non Hodgkin's lymphoma ([place]), Paroxysmal atrial fibrillation ([place]) (01/02/2016), Peripheral arterial disease ([place]), Personal history of other diseases of circulatory system, PONV (postoperative nausea and vomiting), Prostate cancer ([place]) (2000), Shingles, Shortness of breath, Sleep apnea, and Stroke ([place]) (2013).   Surgical History    Past Surgical History:  Procedure Laterality Date  . BLADDER SURGERY    . BONE MARROW TRANSPLANT  2011  . CARDIOVERSION N/A 06/02/2019   Procedure: CARDIOVERSION;  Surgeon: Sanda Klein, MD;  Location: [facility] ENDOSCOPY;  Service: Cardiovascular;  Laterality: N/A;  . COLON SURGERY     colon resection  . COLONOSCOPY N/A 01/01/2013   Procedure: COLONOSCOPY;  Surgeon: Rogene Houston, MD;  Location: [facility] ENDO SUITE;  Service: Endoscopy;  Laterality: N/A;  825-moved to [place] notified pt  . COLONOSCOPY N/A 07/16/2018   Procedure: COLONOSCOPY;  Surgeon: Rogene Houston, MD;  Location: [facility] ENDO SUITE;  Service: Endoscopy;  Laterality: N/A;  1:25  . CORONARY ANGIOPLASTY  06/24/2000   PCI and stenting in mid & proximal RCA  . heart stents x 5  1999  . INGUINAL HERNIA REPAIR Right 05/04/2014   Procedure: OPEN RIGHT INGUINAL HERNIA REPAIR with mesh;  Surgeon: Edward Jolly, MD;  Location: [facility] ORS;  Service: General;  Laterality: Right;  . INSERT / REPLACE / REMOVE PACEMAKER    . left ear skin cancer removed    . NM MYOCAR PERF WALL MOTION  11/27/2007   inferior scar  . PACEMAKER INSERTION  07/22/2011   Medtronic  . POLYPECTOMY  07/16/2018   Procedure: POLYPECTOMY;  Surgeon: Rogene Houston, MD;  Location: [facility];  Service: Endoscopy;;  colon  . PORTACATH PLACEMENT  07/26/2009   right chest  . PROSTATE SURGERY    . Rotator    . ROTATOR CUFF REPAIR Right   . SHOULDER ARTHROSCOPY WITH SUBACROMIAL DECOMPRESSION Right 07/21/2013   Procedure: RIGHT SHOULDER ARTHROSCOPY WITH SUBACROMIAL DECOMPRESSION AND DEBRIDEMENT & Injection of Left Shoulder;  Surgeon: Alta Corning, MD;  Location: [place];  Service: Orthopedics;  Laterality: Right;  . TEE WITHOUT CARDIOVERSION  10/13/2012   Procedure: TRANSESOPHAGEAL ECHOCARDIOGRAM (TEE);  Surgeon: Sanda Klein, MD;  Location: [facility] ENDOSCOPY;  Service: Cardiovascular;  Laterality: N/A;  pat/kay/echo notified  . US ECHOCARDIOGRAPHY  06/19/2011   RV mildly dilated,mild to mod. MR,mild AI,mild PI  . WRIST SURGERY     right     Social History   reports that he quit smoking about 25 years ago. His smoking use included cigarettes. He has a 20.00 pack-year smoking history. He has never used smokeless tobacco. He reports that he does  not drink alcohol or use drugs.   Family history   His family history includes Cancer in his brother and father; Heart attack in his sister; Heart disease in his brother, father, and sister; Hyperlipidemia in his sister; Hypertension in his mother and sister. There is no history of Colon cancer or Colon polyps.   Allergies Allergies  Allergen Reactions  . Morphine And Related Other (See Comments)    hallucinations  . Tape Rash    Paper tape is ok     Home meds  Prior to Admission medications   Medication Sig Start Date End Date Taking? Authorizing Provider  albuterol (PROVENTIL HFA;VENTOLIN HFA) 108 (90 Base) MCG/ACT inhaler Inhale 2 puffs into the lungs every 6 (six) hours as needed for wheezing or shortness of breath. 11/17/17  Yes Mikey Kirschner, MD  albuterol (PROVENTIL) (2.5 MG/3ML) 0.083% nebulizer solution Take 2.5 mg by nebulization every 4 (four) hours as needed for wheezing or shortness of breath.  04/19/16  Yes [provider]  ALPRAZolam (XANAX) 0.25 MG tablet Take 0.25 mg by mouth as needed.  06/14/19  Yes [provider]  amLODipine (NORVASC) 5 MG tablet Take 10 mg by mouth daily.    Yes [provider]  clopidogrel (PLAVIX) 75 MG tablet TAKE 1 TABLET BY MOUTH EVERY DAY 07/07/19  Yes Croitoru, Mihai, MD  ELIQUIS 5 MG TABS tablet TAKE 1 TABLET BY MOUTH  TWICE A DAY 10/20/19  Yes Croitoru, Mihai, MD  furosemide (LASIX) 20 MG tablet Use sparingly as needed for edema. No more than one tablet by mouth per week. 07/29/17  Yes Lorretta Harp, MD  HYDROcodone-acetaminophen (NORCO/VICODIN) 5-325 MG tablet Take 1 tablet by mouth 2 (two) times daily as needed. for pain 05/21/18  Yes [provider]  ipratropium-albuterol (DUONEB) 0.5-2.5 (3) MG/3ML SOLN Take 3 mLs by nebulization every 6 (six) hours as needed (shortness of breath).  01/27/19  Yes [provider]  lenalidomide (REVLIMID) 10 MG capsule TAKE 1 CAPSULE BY MOUTH  DAILY FOR 7 DAYS ON THEN 7  DAYS OFF THEN REPEAT CYCLE 11/19/19  Yes Derek Jack, MD  levocetirizine (XYZAL) 5 MG tablet Take 5 mg by mouth every evening.    Yes [provider]  methocarbamol (ROBAXIN) 750 MG tablet Take 750 mg by mouth every 8 (eight) hours as needed for muscle spasms.  02/06/19  Yes [provider]  mometasone (NASONEX) 50 MCG/ACT nasal spray Place 2 sprays into the nose daily as needed (allergies).  06/10/18  Yes [provider]  nitroGLYCERIN (NITROSTAT) 0.4 MG SL tablet PLACE 1 TABLET UNDER THE TONGUE EVERY 5 MINUTES AS NEEDED FOR CHEST PAIN. 08/27/19  Yes Croitoru, Mihai, MD  ondansetron (ZOFRAN) 4 MG tablet Take 4 mg by mouth every 8 (eight) hours as needed for nausea or vomiting.  04/08/18  Yes [provider]  pantoprazole (PROTONIX) 40 MG tablet Take 40 mg by mouth daily.   Yes [provider]  potassium chloride SA (KLOR-CON M20) 20 MEQ tablet Take 1 tablet (20 mEq total) by mouth 3 (three) times daily. 10/15/19  Yes Lockamy, Randi L, NP-C  simvastatin (ZOCOR) 20 MG tablet Take 1 tablet (20 mg total) by mouth daily at 6 PM. 11/24/19  Yes Croitoru, Mihai, MD  diltiazem (CARDIZEM CD) 120 MG 24 hr capsule Take 1 capsule (120 mg total) by mouth daily. Patient not taking: Reported on 12/09/2019 05/25/19   Croitoru, Mihai, MD  loperamide (IMODIUM) 2 MG  capsule TAKE 2  CAPSULES BY MOUTH 2 TIMES DAILY AS NEEDED FOR DIARRHEA OR LOOSE STOOLS. Patient not taking: Reported on 10/28/2019 10/22/19   Noralyn Pick, NP  octreotide (SANDOSTATIN LAR DEPOT) 10 MG injection Inject 10 mg into the muscle every 28 (twenty-eight) days. 06/28/19   Noralyn Pick, NP  omeprazole (PRILOSEC) 20 MG capsule Take 20 mg by mouth daily.    [provider]    Critical care time: 40 min.    Montey Hora, [facility] 12/10/2019, 12:04 AM

## 2019-12-10 NOTE — Progress Notes (Signed)
 Initial Nutrition Assessment  DOCUMENTATION CODES:   Not applicable  INTERVENTION:   Tube Feeding:  Initiate trickle TF today per MD Start Vital AF 1.2 at 20 ml/hr, goal rate of 55 ml/hr Pro-Stat 30 mL BID Goal rate provides 129 g of protein, 1784 kcals, 1069 mL of free water Meets 100% estimated protein and calorie needs  NUTRITION DIAGNOSIS:   Inadequate oral intake related to acute illness as evidenced by NPO status.  GOAL:   Patient will meet greater than or equal to 90% of their needs  MONITOR:   TF tolerance, Vent status, Labs, Weight trends  REASON FOR ASSESSMENT:   Consult, Ventilator Enteral/tube feeding initiation and management  ASSESSMENT:   79 yo male admitted with a/fib with RVR and developed shock with multiple organ failure, acute respiratory failure requiring intubation. PMH includes CHF, COPD, HTN, CAD, HLD, hx of multiple myeloma   RD working remotely.  Patient is currently intubated on ventilator support, fentanyl and versed for sedation; requiring levophed, vasopressin; off dobutatmine MV: 10.9 L/min Temp (24hrs), Avg:96 F (35.6 C), Min:91.6 F (33.1 C), Max:98.8 F (37.1 C)  Discussed poc with CCM team; plan to start trickle TF today  Per chest xray, OG tube enters the stomach  Unable to obtain diet and weight history from patient at this time  Admit wt 79.2 kg; current wt 82.8 kg  Labs: Creatinine 2.20, BUN 44 Meds: lasix, sodium bicarb drip  Diet Order:   Diet Order            Diet NPO time specified  Diet effective now              EDUCATION NEEDS:   Not appropriate for education at this time  Skin:  Skin Assessment: Reviewed RN Assessment  Last BM:  no documented BM  Height:   Ht Readings from Last 1 Encounters:  12/09/19 '5\' 7"'$  (1.702 m)    Weight:   Wt Readings from Last 1 Encounters:  12/10/19 82.8 kg    BMI:  Body mass index is 28.59 kg/m.  Estimated Nutritional Needs:   Kcal:  1727  kcals  Protein:  115-145 g  Fluid:  >/= 1.7 L     Luma Clopper MS, RDN, LDN, CNSC [phone] Pager  [phone] Weekend/On-Call Pager

## 2019-12-10 NOTE — Progress Notes (Signed)
Advanced Heart Failure Rounding Note   Subjective:    Remains intubated. On NE 39, DBA 2.5. SBP now > 100. Co-ox 87%  LFTs and creatinine up. Lactate > 11. Had VT overnight and again this am broke spontaneously   Last ABG 7.17/22/149/99%  Objective:   Weight Range:  Vital Signs:   Temp:  [91.6 F (33.1 C)-98.1 F (36.7 C)] 97.2 F (36.2 C) (01/29 0747) Pulse Rate:  [27-124] 84 (01/29 0747) Resp:  [14-39] 26 (01/29 0747) BP: (85-223)/(36-184) 102/59 (01/29 0747) SpO2:  [70 %-100 %] 100 % (01/29 0747) FiO2 (%):  [50 %-100 %] 50 % (01/29 0747) Weight:  [82.8 kg] 82.8 kg (01/29 0630) Last BM Date: (PTA)  Weight change: Filed Weights   12/09/19 0052 12/09/19 0545 12/10/19 0630  Weight: 81.6 kg 79.2 kg 82.8 kg    Intake/Output:   Intake/Output Summary (Last 24 hours) at 12/10/2019 0829 Last data filed at 12/10/2019 0630 Gross per 24 hour  Intake 3581.23 ml  Output 1550 ml  Net 2031.23 ml     Physical Exam: General:  Intubated. Sedated  HEENT: normal Neck: supple. JVP . Carotids 2+ bilat; no bruits. No lymphadenopathy or thryomegaly appreciated. Cor: PMI nondisplaced. Regular rate & rhythm. No rubs, gallops or murmurs. Lungs: clear Abdomen: soft, nontender, nondistended. No hepatosplenomegaly. No bruits or masses. Good bowel sounds. Extremities: no cyanosis, clubbing, rash, edema Neuro: alert & orientedx3, cranial nerves grossly intact. moves all 4 extremities w/o difficulty. Affect pleasant  Telemetry: AF with vpacing + VT  Personally reviewed   Labs: Basic Metabolic Panel: Recent Labs  Lab 12/09/19 0130 12/09/19 0130 12/09/19 0930 12/09/19 0930 12/09/19 2150 12/10/19 0058 12/10/19 0139 12/10/19 0419 12/10/19 0457  NA 135   < > 134*   < > 136 135 138 140 140  K 4.4   < > 4.2   < > 5.8* 5.2* 4.5 3.7 3.9  CL 102  --  101  --  101  --   --   --  97*  CO2 22  --  19*  --  10*  --   --   --  9*  GLUCOSE 128*  --  150*  --  115*  --   --   --  206*    BUN 36*  --  38*  --  40*  --   --   --  44*  CREATININE 1.35*  --  1.48*  --  2.14*  --   --   --  2.70*  CALCIUM 9.6   < > 9.3  --  9.2  --   --   --  7.7*  MG  --   --  2.5*  --   --   --   --   --  2.2   < > = values in this interval not displayed.    Liver Function Tests: Recent Labs  Lab 12/09/19 0130 12/09/19 2150 12/10/19 0457  AST 78* 796* 3,286*  ALT 70* 694* 2,140*  ALKPHOS 83 75 77  BILITOT 0.9 2.2* 2.5*  PROT 7.6 7.0 5.2*  ALBUMIN 3.8 3.3* 2.6*   No results for input(s): LIPASE, AMYLASE in the last 168 hours. No results for input(s): AMMONIA in the last 168 hours.  CBC: Recent Labs  Lab 12/09/19 0130 12/10/19 0058 12/10/19 0139 12/10/19 0419  WBC 7.2  --   --   --   NEUTROABS 5.2  --   --   --  HGB 11.9* 12.9* 11.6* 11.2*  HCT 39.4 38.0* 34.0* 33.0*  MCV 91.4  --   --   --   PLT 133*  --   --   --     Cardiac Enzymes: No results for input(s): CKTOTAL, CKMB, CKMBINDEX, TROPONINI in the last 168 hours.  BNP: BNP (last 3 results) Recent Labs    12/09/19 0130  BNP 2,803.0*    ProBNP (last 3 results) No results for input(s): PROBNP in the last 8760 hours.    Other results:  Imaging: Portable Chest xray  Result Date: 12/10/2019 CLINICAL DATA:  Respiratory failure EXAM: PORTABLE CHEST 1 VIEW COMPARISON:  Earlier today FINDINGS: Endotracheal tube tip is just below the clavicular heads. The enteric tube reaches the stomach. Right IJ line and right porta catheter with tips at the upper cavoatrial junction. Dual-chamber pacer leads from the left. Hazy and streaky density at the lung bases. No Kerley lines or pneumothorax. Mild cardiomegaly is stable. IMPRESSION: 1. Stable hardware positioning. 2. Lower lobe atelectasis or infiltrates. Electronically Signed   By: Monte Fantasia M.D.   On: 12/10/2019 05:34   DG CHEST PORT 1 VIEW  Result Date: 12/10/2019 CLINICAL DATA:  Central line, OG placement EXAM: PORTABLE CHEST 1 VIEW COMPARISON:  12/09/2019  FINDINGS: Endotracheal tube is 5 cm above the carina. Right Port-A-Cath is unchanged. Interval placement of right central line with the tip in the SVC. No pneumothorax. NG tube is in the stomach. Cardiomegaly with vascular congestion. No confluent opacities or edema. No acute bony abnormality. IMPRESSION: Support devices in expected position as above. Cardiomegaly, vascular congestion. Electronically Signed   By: Rolm Baptise M.D.   On: 12/10/2019 01:18   DG CHEST PORT 1 VIEW  Result Date: 12/09/2019 CLINICAL DATA:  Shortness of breath EXAM: PORTABLE CHEST 1 VIEW COMPARISON:  Chest radiograph dated 12/09/2019 FINDINGS: A left subclavian approach cardiac device is redemonstrated. A right internal jugular central venous port catheter is unchanged in position, however the tip is obscured. The heart remains enlarged. Vascular calcifications are seen in the aortic arch. Mild bibasilar atelectasis is unchanged. No pleural effusion or pneumothorax. IMPRESSION: Unchanged cardiomegaly and mild bibasilar atelectasis. Electronically Signed   By: Zerita Boers M.D.   On: 12/09/2019 21:33   DG Chest Portable 1 View  Result Date: 12/09/2019 CLINICAL DATA:  Shortness of breath EXAM: PORTABLE CHEST 1 VIEW COMPARISON:  12/06/2019 FINDINGS: Cardiomegaly. Right Port-A-Cath and left pacer remain in place, unchanged. Bibasilar scarring or atelectasis. No confluent opacities, effusions or edema. IMPRESSION: Cardiomegaly.  Bibasilar scarring or atelectasis. Electronically Signed   By: Rolm Baptise M.D.   On: 12/09/2019 01:57   ECHOCARDIOGRAM COMPLETE  Result Date: 12/09/2019   ECHOCARDIOGRAM REPORT   Patient Name:   RAYSHARD SCHIRTZINGER Date of Exam: 12/09/2019 Medical Rec #:  [id number]       Height:       67.0 in Accession #:    [id number]      Weight:       174.6 lb Date of Birth:  [birth year]       BSA:          1.91 m Patient Age:    79 years        BP:           120/79 mmHg Patient Gender: M               HR:           98 bpm.  Exam Location:  [facility] Procedure: 2D Echo, Cardiac Doppler and Color Doppler Indications:    I48.0 Paroxysmal atrial fibrillation  History:        Patient has prior history of Echocardiogram examinations, most                 recent 11/13/2018. CHF, CAD and Previous Myocardial Infarction,                 Stroke and COPD, Arrythmias:Atrial Fibrillation,                 Signs/Symptoms:Dyspnea and Murmur; Risk Factors:Hypertension,                 Dyslipidemia and Sleep Apnea. Aortic Aneurysm.  Sonographer:    Tiffany Dance Referring Phys: [id number] [place]  1. Left ventricular ejection fraction, by visual estimation, is 30 to 35%. The left ventricle has severely decreased function. There is mildly increased left ventricular hypertrophy.  2. Left ventricular diastolic function could not be evaluated.  3. The left ventricle demonstrates global hypokinesis.  4. Global right ventricle has mildly reduced systolic function.The right ventricular size is moderately enlarged. No increase in right ventricular wall thickness.  5. Left atrial size was severely dilated.  6. Right atrial size was moderately dilated.  7. The mitral valve is grossly normal. Moderate mitral valve regurgitation.  8. The tricuspid valve is grossly normal.  9. The tricuspid valve is grossly normal. Tricuspid valve regurgitation is moderate. 10. The aortic valve is tricuspid. Aortic valve regurgitation is mild. No evidence of aortic valve sclerosis or stenosis. 11. Pulmonic regurgitation is mild. 12. The pulmonic valve was grossly normal. Pulmonic valve regurgitation is mild. 13. Mildly elevated pulmonary artery systolic pressure. 14. A pacer wire is visualized. 15. The inferior vena cava is dilated in size with <50% respiratory variability, suggesting right atrial pressure of 15 mmHg. 16. The interatrial septum was not well visualized. FINDINGS  Left Ventricle: Left ventricular ejection fraction, by visual estimation, is 30 to 35%.  The left ventricle has severely decreased function. The left ventricle demonstrates global hypokinesis. There is mildly increased left ventricular hypertrophy. Septal left ventricular hypertrophy. The left ventricular diastology could not be evaluated due to atrial fibrillation. Left ventricular diastolic function could not be evaluated. Right Ventricle: The right ventricular size is moderately enlarged. No increase in right ventricular wall thickness. Global RV systolic function is has mildly reduced systolic function. The tricuspid regurgitant velocity is 2.49 m/s, and with an assumed right atrial pressure of 15 mmHg, the estimated right ventricular systolic pressure is mildly elevated at 39.7 mmHg. Left Atrium: Left atrial size was severely dilated. Right Atrium: Right atrial size was moderately dilated Pericardium: There is no evidence of pericardial effusion. Mitral Valve: The mitral valve is grossly normal. There is mild thickening of the mitral valve leaflet(s). Moderate mitral valve regurgitation. Tricuspid Valve: The tricuspid valve is grossly normal. Tricuspid valve regurgitation is moderate. Aortic Valve: The aortic valve is tricuspid. Aortic valve regurgitation is mild. The aortic valve is structurally normal, with no evidence of sclerosis or stenosis. Mild aortic valve annular calcification. Pulmonic Valve: The pulmonic valve was grossly normal. Pulmonic valve regurgitation is mild. Pulmonic regurgitation is mild. Aorta: The aortic root is normal in size and structure. Venous: The inferior vena cava is dilated in size with less than 50% respiratory variability, suggesting right atrial pressure of 15 mmHg. IAS/Shunts: The interatrial septum was not well visualized. Additional Comments: A pacer wire is  visualized.  LEFT VENTRICLE PLAX 2D LVIDd:         7.22 cm LVIDs:         6.14 cm LV PW:         0.99 cm LV IVS:        1.11 cm LVOT diam:     2.30 cm LV SV:         84 ml LV SV Index:   43.10 LVOT Area:      4.15 cm  RIGHT VENTRICLE            IVC RV Basal diam:  4.13 cm    IVC diam: 2.53 cm RV Mid diam:    2.20 cm RV S prime:     9.79 cm/s TAPSE (M-mode): 2.1 cm LEFT ATRIUM              Index       RIGHT ATRIUM           Index LA diam:        6.40 cm  3.35 cm/m  RA Area:     32.90 cm LA Vol (A2C):   170.0 ml 89.06 ml/m RA Volume:   121.50 ml 63.65 ml/m LA Vol (A4C):   134.0 ml 70.20 ml/m LA Biplane Vol: 152.0 ml 79.63 ml/m  AORTIC VALVE LVOT Vmax:   46.70 cm/s LVOT Vmean:  28.100 cm/s LVOT VTI:    0.063 m  AORTA Ao Root diam: 3.60 cm Ao Asc diam:  3.50 cm MITRAL VALVE                       TRICUSPID VALVE MV Area (PHT): 3.91 cm            TR Peak grad:   24.7 mmHg MV PHT:        56.26 msec          TR Vmax:        257.00 cm/s MV Decel Time: 194 msec MV E velocity: 66.45 cm/s 103 cm/s SHUNTS                                    Systemic VTI:  0.06 m                                    Systemic Diam: 2.30 cm  Kate Sable MD Electronically signed by Kate Sable MD Signature Date/Time: 12/09/2019/10:32:42 AM    Final    [place] EKG SITE RITE  Result Date: 12/09/2019 If Site Rite image not attached, placement could not be confirmed due to current cardiac rhythm.     Medications:     Scheduled Medications: . atorvastatin  10 mg Oral q1800  . Chlorhexidine Gluconate Cloth  6 each Topical Daily  . dextrose  50 mL Intravenous Once  . furosemide  40 mg Intravenous Daily  . lenalidomide  10 mg Oral Daily  . [START ON 12/22/2019] lenalidomide  10 mg Oral Daily  . mouth rinse  15 mL Mouth Rinse BID  . pantoprazole (PROTONIX) IV  40 mg Intravenous Q12H  . sodium chloride flush  10-40 mL Intracatheter Q12H  . sodium chloride flush  3 mL Intravenous Q12H     Infusions: . sodium chloride Stopped (12/10/19 0549)  . amiodarone 30 mg/hr (12/10/19 0630)  .  amiodarone    . ceFEPime (MAXIPIME) IV Stopped (12/10/19 0437)  . DOBUTamine 5 mcg/kg/min (12/10/19 0630)  . epinephrine 2.5 mcg/min (12/10/19  0630)  . fentaNYL infusion INTRAVENOUS 25 mcg/hr (12/10/19 0630)  . heparin Stopped (12/10/19 0421)  . heparin    . midazolam 3 mg/hr (12/10/19 0630)  . norepinephrine (LEVOPHED) Adult infusion 40 mcg/min (12/10/19 0630)  . sodium bicarbonate 150 mEq in dextrose 5% 1000 mL 150 mL/hr at 12/10/19 0630  . [START ON 12/12/2019] vancomycin       PRN Medications:  sodium chloride, acetaminophen **OR** acetaminophen, albuterol, amiodarone, bisacodyl, fentaNYL, fentaNYL (SUBLIMAZE) injection, loperamide, midazolam, [DISCONTINUED] ondansetron **OR** ondansetron (ZOFRAN) IV, sodium chloride flush, sodium chloride flush   Assessment/Plan:   1. Shock - Initially felt to be cardiogenic but with low SVR and high co-ox likely septic  - Now with MSOF  - BCx pending. Cefipime and vanc started early this am - He is immunocompromised in setting of myeloma/chemo. Gets routine IVIG.  - Lactate persistently  > 11 - Continue NE and epi. Add vasopressin. Stop DBA - May need stress dose steroids.  - Continue bicarb gtt - Prognosis extremely guarded. He is now limited code. Would not want CPR or defib (cardioversion fo VT or AF ok). No CVVHD - Will keep pushing for 48 hours and see if he can turn the corner - CCM will assume primary  2. Acute systolic HF - EF 21-11% by echo (newly down). In setting of sepsis - continue hemodynamic support  3. Acute hypoxic respiratory failure - intubated - CCM on board   4. CAD - s/p previous PCIs - hs trop 30 -> 43 -> 43. Not c/w ACS  5. AKI - Creatinine 1.3-> 2.1 -> 2.7 - Due to ATN from shock - Suspect will continue to get worse for a bit - Continue hemodynamic support - Would not want CVVHD  6. Shock liver - supportive care  7. PAF/VT - IV amio. - heparin - Interrogate MDT pacer  8. Multiple myeloma - [facility] at [facility]  9. Dementia - unclear how severe this is  10. Limited code  - Prognosis extremely guarded. He is now limited code.  Would not want CPR or defib (cardioversion fo VT or AF ok). No CVVHD - Will keep pushing for 48 hours and see if he can turn the corner  CRITICAL CARE Performed by: Glori Bickers  Total critical care time: 45 minutes  Critical care time was exclusive of separately billable procedures and treating other patients.  Critical care was necessary to treat or prevent imminent or life-threatening deterioration.  Critical care was time spent personally by me (independent of midlevel providers or residents) on the following activities: development of treatment plan with patient and/or surrogate as well as nursing, discussions with consultants, evaluation of patient's response to treatment, examination of patient, obtaining history from patient or surrogate, ordering and performing treatments and interventions, ordering and review of laboratory studies, ordering and review of radiographic studies, pulse oximetry and re-evaluation of patient's condition.    Length of Stay: 1   Glori Bickers 12/10/2019, 8:29 AM  Advanced Heart Failure Team Pager [phone] (M-F; 7a - 4p)  Please contact [facility] for night-coverage after hours (4p -7a ) and weekends on [email or web address]

## 2019-12-10 NOTE — Progress Notes (Signed)
NAME:  Jeremy Parrish, MRN:  [id number], DOB:  11/01/1941, LOS: 1 ADMISSION DATE:  12/09/2019, CONSULTATION DATE:  12/09/18 REFERRING MD:  Bronson Ing  CHIEF COMPLAINT:  Vent Management   Brief History   Jeremy Parrish is a 79 y/o male, with a PMH of aortic aneurysm, depression, bladder cancer, multiple myeloma, who presented from [facility] on 1/28 with A. Fib with RVR. On 1/29 he developed shock and was intubated and placed levophed and dobutamine. ICU was consulted for vent management. Past Medical History   Past Medical History:  Diagnosis Date  . Anemia   . Aortic aneurysm of unspecified site without mention of rupture   . Arthritis   . Bladder neck contracture   . Cancer ([place])   . Cerebral atherosclerosis    Carotid Doppler, 02/16/2013 - Bilateral Proximal ICAs,demonstrate mild plaque w/o evidence of significant diameter reduction, dissection, or any other vascular abnormality  . CHF (congestive heart failure) ([place])   . Complication of anesthesia   . COPD (chronic obstructive pulmonary disease) ([place])   . Coronary artery disease   . Depression   . Esophageal reflux   . Heart disease   . Heart murmur   . Hx of bladder cancer 10/07/2012  . Hyperlipidemia   . Hypertension   . 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 Intestinovesical fistula   . Kidney stones    history  . Lung mass   . Multiple myeloma   . Myocardial infarction [facility])    '96  . Non Hodgkin's lymphoma ([place])   . Paroxysmal atrial fibrillation ([place]) 01/02/2016  . Peripheral arterial disease ([place])   . Personal history of other diseases of circulatory system   . PONV (postoperative nausea and vomiting)   . Prostate cancer ([place]) 2000  . Shingles   . Shortness of breath   . Sleep apnea    05-02-14 cpap , not yet used- suggested settings 5  . Stroke [facility]) 2013   Speech.     Significant Hospital Events   1/28 > Admission 1/29 > O/N Intubated 2/2 to  cardiac shock. Pressures stabilizing.   Consults:  PCCM.  Procedures:  ETT 1/29 >  R radial art line 1/29 >  CVL pending 1/29 >   Significant Diagnostic Tests:  Echo 1/28 > EF 30-35%, mild LVH, mildly reduced RV systolic function.  Severely dilated LA, mod dilated RA.  Micro Data:  SARS CoV2 1/28 > neg. Flu 1/28 > neg.  Antimicrobials:  None.   Interim history/subjective:  O/N: Intubated and sedated.  S: Patient sedated and intubated.  Objective:  Blood pressure (!) 106/53, pulse 83, temperature (!) 97.3 F (36.3 C), resp. rate (!) 28, height _0  (1.702 m), weight 82.8 kg, SpO2 100 %. CVP:  [14 mmHg-18 mmHg] 15 mmHg  Vent Mode: PRVC FiO2 (%):  [50 %-100 %] 50 % Set Rate:  [20 bmp] 20 bmp Vt Set:  [520 mL] 520 mL PEEP:  [8 cmH20-10 cmH20] 10 cmH20 Plateau Pressure:  [17 cmH20] 17 cmH20   Intake/Output Summary (Last 24 hours) at 12/10/2019 0718 Last data filed at 12/10/2019 0630 Gross per 24 hour  Intake 3821.67 ml  Output 1550 ml  Net 2271.67 ml   Filed Weights   12/09/19 0052 12/09/19 0545 12/10/19 0630  Weight: 81.6 kg 79.2 kg 82.8 kg    Examination: General: intubated, sedated, ill appearing, diaphoretic  Neuro: intubated and sedated HEENT: normocephalic, atraumatic Cardiovascular: RRR, Parrish  murmurs, rubs, gallops Lungs: CTAB, Parrish wheezes, rales, rhonchi Abdomen: non distended, BS +, non tender Musculoskeletal: Parrish pitting edema in the lower extremities bilaterally Skin: well perfused, dry andcool  Assessment & Plan:   Acute HypoxicRespiratory Failure:  Patient with newfound LVEF of 30-35% with RV dysfunction from 40-45% (11/2018), and 2/2 to cardiogenic shock requires full vent support. - Continue mechanical ventilation.  - Continue to monitor ABGs - ABG 7.17/21/O2 149/bicarb 8.2  Shock with Multi Organ Failure:  Patient with new LVEF of 30-35% with RV dysfunction. Upon arrival he was intubated and required pressors and ventilation. Patient with  hypothermia and tachypneic, Patient with shock liver and decreased renal function. Underlying etiology is cardiogenic vs. Septic shock. - Continuing dobutamine per HF - Continuing epinephrine per HF - Continue Levophed.  - Continue monitoring CVPs co-ox - Transaminitis likely to poor perfusion ast/alt 3000/2000<796/694<70/83 - BC: Pending - Continue vanc and cefepime  - Tracheal aspirate ordered.   A. Fib with RVR: Patient with PMH of A Fib - Holding home dose of Eliquis  - Continue IV amio  - Continue Heparin  - Cardiology continuing to manage with potential for R/L Heart Cath 1/29.   AG Metabolic Acidosis:  - Explained by Lactic Acid Level of >11 2/2 to poor perfusion from cardiogenic shock. Has received multiple sodium bicarbonate injections.  - Currently on sodium bicarbonate drip. - Continue to maximize vent settings  Multiple Myeloma:  - Continuing lenalidomide   Acute Kidney Injury:  Patient with increasing Cr. On admission Cr of 1.35 to 2.14, likely prerenal in etiology secondary to poor perfusion from cardiogenic shock.  - Continue to monitor BMPs   Best Practice:  Diet: NPO. Pain/Anxiety/Delirium protocol (if indicated): fentanyl gtt / midazolam gtt.  RASS goal -1. VAP protocol (if indicated): In place. DVT prophylaxis: SCD's / Heparin gtt. GI prophylaxis: PPI. Glucose control: SSI if glucose consistently > 180. Mobility: Bedrest. Code Status: Full. Family Communication: Per primary. Disposition: ICU.  Labs   CBC: Recent Labs  Lab 12/09/19 0130 12/10/19 0058 12/10/19 0139 12/10/19 0419  WBC 7.2  --   --   --   NEUTROABS 5.2  --   --   --   HGB 11.9* 12.9* 11.6* 11.2*  HCT 39.4 38.0* 34.0* 33.0*  MCV 91.4  --   --   --   PLT 133*  --   --   --    Basic Metabolic Panel: Recent Labs  Lab 12/09/19 0130 12/09/19 0130 12/09/19 0930 12/09/19 0930 12/09/19 2150 12/10/19 0058 12/10/19 0139 12/10/19 0419 12/10/19 0457  NA 135   < > 134*   < > 136  135 138 140 140  K 4.4   < > 4.2   < > 5.8* 5.2* 4.5 3.7 3.9  CL 102  --  101  --  101  --   --   --  97*  CO2 22  --  19*  --  10*  --   --   --  9*  GLUCOSE 128*  --  150*  --  115*  --   --   --  206*  BUN 36*  --  38*  --  40*  --   --   --  44*  CREATININE 1.35*  --  1.48*  --  2.14*  --   --   --  2.70*  CALCIUM 9.6  --  9.3  --  9.2  --   --   --  7.7*  MG  --   --  2.5*  --   --   --   --   --   --    < > = values in this interval not displayed.   GFR: Estimated Creatinine Clearance: 23.6 mL/min (A) (by C-G formula based on SCr of 2.7 mg/dL (H)). Recent Labs  Lab 12/09/19 0130 12/09/19 2151 12/10/19 0103 12/10/19 0457  WBC 7.2  --   --   --   LATICACIDVEN  --  >11.0* >11.0* >11.0*   Liver Function Tests: Recent Labs  Lab 12/09/19 0130 12/09/19 2150 12/10/19 0457  AST 78* 796* 3,286*  ALT 70* 694* 2,140*  ALKPHOS 83 75 77  BILITOT 0.9 2.2* 2.5*  PROT 7.6 7.0 5.2*  ALBUMIN 3.8 3.3* 2.6*   Parrish results for input(s): LIPASE, AMYLASE in the last 168 hours. Parrish results for input(s): AMMONIA in the last 168 hours. ABG    Component Value Date/Time   PHART 7.176 (LL) 12/10/2019 0419   PCO2ART 21.7 (L) 12/10/2019 0419   PO2ART 149.0 (H) 12/10/2019 0419   HCO3 8.2 (L) 12/10/2019 0419   TCO2 9 (L) 12/10/2019 0419   ACIDBASEDEF 19.0 (H) 12/10/2019 0419   O2SAT 87.2 12/10/2019 0505    Coagulation Profile: Recent Labs  Lab 12/09/19 0930  INR 1.7*   Cardiac Enzymes: Parrish results for input(s): CKTOTAL, CKMB, CKMBINDEX, TROPONINI in the last 168 hours. HbA1C: Hgb A1c MFr Bld  Date/Time Value Ref Range Status  01/08/2019 10:57 AM 5.6 4.8 - 5.6 % Final    Comment:    (NOTE) Pre diabetes:          5.7%-6.4% Diabetes:              >6.4% Glycemic control for   <7.0% adults with diabetes   12/15/2017 07:57 AM 5.3 4.8 - 5.6 % Final    Comment:    (NOTE) Pre diabetes:          5.7%-6.4% Diabetes:              >6.4% Glycemic control for   <7.0% adults with diabetes      CBG: Parrish results for input(s): GLUCAP in the last 168 hours.  Review of Systems:   Unable to obtain as pt is encephalopathic.  Surgical History    Past Surgical History:  Procedure Laterality Date  . BLADDER SURGERY    . BONE MARROW TRANSPLANT  2011  . CARDIOVERSION N/A 06/02/2019   Procedure: CARDIOVERSION;  Surgeon: Sanda Klein, MD;  Location: [facility] ENDOSCOPY;  Service: Cardiovascular;  Laterality: N/A;  . COLON SURGERY     colon resection  . COLONOSCOPY N/A 01/01/2013   Procedure: COLONOSCOPY;  Surgeon: Rogene Houston, MD;  Location: [facility] ENDO SUITE;  Service: Endoscopy;  Laterality: N/A;  825-moved to [place] notified pt  . COLONOSCOPY N/A 07/16/2018   Procedure: COLONOSCOPY;  Surgeon: Rogene Houston, MD;  Location: [facility] ENDO SUITE;  Service: Endoscopy;  Laterality: N/A;  1:25  . CORONARY ANGIOPLASTY  06/24/2000   PCI and stenting in mid & proximal RCA  . heart stents x 5  1999  . INGUINAL HERNIA REPAIR Right 05/04/2014   Procedure: OPEN RIGHT INGUINAL HERNIA REPAIR with mesh;  Surgeon: Edward Jolly, MD;  Location: [facility] ORS;  Service: General;  Laterality: Right;  . INSERT / REPLACE / REMOVE PACEMAKER    . left ear skin cancer removed    . NM MYOCAR PERF WALL MOTION  11/27/2007  inferior scar  . PACEMAKER INSERTION  07/22/2011   [employer]  . POLYPECTOMY  07/16/2018   Procedure: POLYPECTOMY;  Surgeon: Rogene Houston, MD;  Location: [facility] ENDO SUITE;  Service: Endoscopy;;  colon  . PORTACATH PLACEMENT  07/26/2009   right chest  . PROSTATE SURGERY    . Rotator    . ROTATOR CUFF REPAIR Right   . SHOULDER ARTHROSCOPY WITH SUBACROMIAL DECOMPRESSION Right 07/21/2013   Procedure: RIGHT SHOULDER ARTHROSCOPY WITH SUBACROMIAL DECOMPRESSION AND DEBRIDEMENT & Injection of Left Shoulder;  Surgeon: Alta Corning, MD;  Location: [facility];  Service: Orthopedics;  Laterality: Right;  . TEE WITHOUT CARDIOVERSION  10/13/2012   Procedure: TRANSESOPHAGEAL ECHOCARDIOGRAM (TEE);  Surgeon: Sanda Klein, MD;  Location: [facility] ENDOSCOPY;  Service: Cardiovascular;  Laterality: N/A;  pat/kay/echo notified  . US ECHOCARDIOGRAPHY  06/19/2011   RV mildly dilated,mild to mod. MR,mild AI,mild PI  . WRIST SURGERY     right    Allergies Allergies  Allergen Reactions  . Morphine And Related Other (See Comments)    hallucinations  . Tape Rash    Paper tape is ok     Home meds  Prior to Admission medications   Medication Sig Start Date End Date Taking? Authorizing Provider  albuterol (PROVENTIL HFA;VENTOLIN HFA) 108 (90 Base) MCG/ACT inhaler Inhale 2 puffs into the lungs every 6 (six) hours as needed for wheezing or shortness of breath. 11/17/17  Yes Mikey Kirschner, MD  albuterol (PROVENTIL) (2.5 MG/3ML) 0.083% nebulizer solution Take 2.5 mg by nebulization every 4 (four) hours as needed for wheezing or shortness of breath.  04/19/16  Yes [provider]  ALPRAZolam (XANAX) 0.25 MG tablet Take 0.25 mg by mouth as needed.  06/14/19  Yes [provider]  amLODipine (NORVASC) 5 MG tablet Take 10 mg by mouth daily.    Yes [provider]  clopidogrel (PLAVIX) 75 MG tablet TAKE 1 TABLET BY MOUTH EVERY DAY 07/07/19  Yes Croitoru, Mihai, MD  ELIQUIS 5 MG TABS tablet TAKE 1 TABLET BY MOUTH TWICE A DAY 10/20/19  Yes Croitoru, Mihai, MD  furosemide (LASIX) 20 MG tablet Use sparingly as needed for edema. Parrish more than one tablet by mouth per week. 07/29/17  Yes Lorretta Harp, MD  HYDROcodone-acetaminophen (NORCO/VICODIN) 5-325 MG tablet Take 1 tablet by mouth 2 (two) times daily as needed. for pain 05/21/18  Yes [provider]  ipratropium-albuterol (DUONEB) 0.5-2.5 (3) MG/3ML SOLN Take 3 mLs by nebulization every 6 (six) hours as needed (shortness of breath).  01/27/19  Yes [provider]  lenalidomide (REVLIMID) 10 MG capsule TAKE 1 CAPSULE BY MOUTH  DAILY FOR 7 DAYS ON THEN 7  DAYS OFF THEN REPEAT CYCLE 11/19/19  Yes Derek Jack, MD  levocetirizine (XYZAL)  5 MG tablet Take 5 mg by mouth every evening.    Yes [provider]  methocarbamol (ROBAXIN) 750 MG tablet Take 750 mg by mouth every 8 (eight) hours as needed for muscle spasms.  02/06/19  Yes [provider]  mometasone (NASONEX) 50 MCG/ACT nasal spray Place 2 sprays into the nose daily as needed (allergies).  06/10/18  Yes [provider]  nitroGLYCERIN (NITROSTAT) 0.4 MG SL tablet PLACE 1 TABLET UNDER THE TONGUE EVERY 5 MINUTES AS NEEDED FOR CHEST PAIN. 08/27/19  Yes Croitoru, Mihai, MD  ondansetron (ZOFRAN) 4 MG tablet Take 4 mg by mouth every 8 (eight) hours as needed for nausea or vomiting.  04/08/18  Yes [provider]  pantoprazole (PROTONIX) 40 MG tablet Take 40 mg by mouth daily.   Yes [provider]  potassium chloride SA (KLOR-CON M20) 20 MEQ tablet Take 1 tablet (20 mEq total) by mouth 3 (three) times daily. 10/15/19  Yes Lockamy, Randi L, NP-C  simvastatin (ZOCOR) 20 MG tablet Take 1 tablet (20 mg total) by mouth daily at 6 PM. 11/24/19  Yes Croitoru, Mihai, MD  diltiazem (CARDIZEM CD) 120 MG 24 hr capsule Take 1 capsule (120 mg total) by mouth daily. Patient not taking: Reported on 12/09/2019 05/25/19   Croitoru, Mihai, MD  loperamide (IMODIUM) 2 MG capsule TAKE 2 CAPSULES BY MOUTH 2 TIMES DAILY AS NEEDED FOR DIARRHEA OR LOOSE STOOLS. Patient not taking: Reported on 10/28/2019 10/22/19   Noralyn Pick, NP  octreotide (SANDOSTATIN LAR DEPOT) 10 MG injection Inject 10 mg into the muscle every 28 (twenty-eight) days. 06/28/19   Noralyn Pick, NP  omeprazole (PRILOSEC) 20 MG capsule Take 20 mg by mouth daily.    [provider]    Critical care time: 40 min.    Maudie Mercury, MD IMTS, PGY-1 Pager: [phone] 12/10/2019,7:18 AM

## 2019-12-10 NOTE — CV Procedure (Signed)
Central Venous Catheter Insertion Procedure Note Jeremy Parrish OE:[id number] 1941-09-23    Procedure: Insertion of Central Venous Catheter Indications: Drug and/or fluid administration   Procedure Details Consent: Risks of procedure as well as the alternatives and risks of each were explained to the (patient/caregiver).  Consent for procedure obtained. Time Out: Verified patient identification, verified procedure, site/side was marked, verified correct patient position, special equipment/implants available, medications/allergies/relevent history reviewed, required imaging and test results available.  Performed   Maximum sterile technique was used including antiseptics, cap, gloves, gown, hand hygiene, mask and sheet. Skin prep: Chlorhexidine; local anesthetic administered A antimicrobial bonded/coated triple lumen catheter was placed in the right internal jugular vein using the Seldinger technique and u/s guidance.   Evaluation Blood flow good Complications: No apparent complications Patient did tolerate procedure well. Chest X-ray ordered to verify placement.  CXR: ok   Glori Bickers, MD  1:15 AM

## 2019-12-10 NOTE — CV Procedure (Signed)
Radial arterial line placement.    Emergent consent assumed. The right wrist was prepped and draped in the routine sterile fashion a single lumen radial arterial catheter was placed in the right radial artery using a modified Seldinger technique and u/s guidance. Good blood flow and wave forms. A dressing was placed.     Glori Bickers, MD  1:14 AM

## 2019-12-10 NOTE — Progress Notes (Addendum)
Advanced Heart Failure Team Consult Note   Primary Physician: Celene Squibb, MD PCP-Cardiologist:  Sanda Klein, MD   Requesting: Dr. Jacinta Shoe   Reason for Consultation: cardiogenic shock  HPI:    Jeremy Parrish is a 79 y.o. male with past medical history of CAD (s/p prior PCI's by review of notes but no details listed in chart), SSS (s/p PPM placement in 2012), paroxysmal atrial fibrillation (s/p DCCV in 05/2019 - recurrence within 24 hours and rate-control strategy pursued), HTN, HLD, multiple myeloma (followed by Oncology at [facility]), dementia and prior CVA   Last seen by Dr. Sallyanne Kuster in 05/2019 and reported having woke up the day prior with a vague episode of chest discomfort.  He reported having baseline dyspnea on exertion in the setting of COPD but denied any  acute changes in this.  His device was interrogated and showed that he had been in atrial fibrillation since the following day. He ultimately underwent outpatient DCCV on 06/02/2019 with return to normal sinus rhythm. By review of records, he has canceled follow-up visits since. Most recent device interrogation in 10/2019 showed a 31% AF burden and 10 beats NSVT.  Presented to [facility] today  With progressive dyspnea and episodes of CP. In ER, found to have AF with RVR at 121. Creatinine 1.35 (baseline 1.0 - 1.1). BNP 2803. Negative for COVID. Initial HS Troponin 37 with repeat of 30. CXR showed cardiomegaly with bibasilar scarring or possible atelectasis. EKG shows atrial fibrillation with RVR, HR 121 with PVC's and nonspecific IVCD.   Treated initially with IV diltiazem and dropped BP. Echo showed EF 30-35% (newly down) and RV dysfunction. Switched to IV amio.   Transferred to [facility]. Developed profound shock and respiratory distress with hypothermia. Moved to ICU. Lactate > 11. ABG 7.19/22/169/98%  On my arrival he was on BIPAP breathing 50x per minute and was mottled with AMS. NE and dobutamine started.   Anesthesia  consulted and he was emergently intubated.     Review of Systems: Not available due to shock     Home Medications Prior to Admission medications   Medication Sig Start Date End Date Taking? Authorizing Provider  albuterol (PROVENTIL HFA;VENTOLIN HFA) 108 (90 Base) MCG/ACT inhaler Inhale 2 puffs into the lungs every 6 (six) hours as needed for wheezing or shortness of breath. 11/17/17  Yes Mikey Kirschner, MD  albuterol (PROVENTIL) (2.5 MG/3ML) 0.083% nebulizer solution Take 2.5 mg by nebulization every 4 (four) hours as needed for wheezing or shortness of breath.  04/19/16  Yes [provider]  ALPRAZolam (XANAX) 0.25 MG tablet Take 0.25 mg by mouth as needed.  06/14/19  Yes [provider]  amLODipine (NORVASC) 5 MG tablet Take 10 mg by mouth daily.    Yes [provider]  clopidogrel (PLAVIX) 75 MG tablet TAKE 1 TABLET BY MOUTH EVERY DAY 07/07/19  Yes Croitoru, Mihai, MD  ELIQUIS 5 MG TABS tablet TAKE 1 TABLET BY MOUTH TWICE A DAY 10/20/19  Yes Croitoru, Mihai, MD  furosemide (LASIX) 20 MG tablet Use sparingly as needed for edema. No more than one tablet by mouth per week. 07/29/17  Yes Lorretta Harp, MD  HYDROcodone-acetaminophen (NORCO/VICODIN) 5-325 MG tablet Take 1 tablet by mouth 2 (two) times daily as needed. for pain 05/21/18  Yes [provider]  ipratropium-albuterol (DUONEB) 0.5-2.5 (3) MG/3ML SOLN Take 3 mLs by nebulization every 6 (six) hours as needed (shortness of breath).  01/27/19  Yes [provider]  lenalidomide (REVLIMID) 10 MG capsule TAKE 1 CAPSULE BY MOUTH  DAILY FOR 7 DAYS ON THEN 7  DAYS OFF THEN REPEAT CYCLE 11/19/19  Yes Derek Jack, MD  levocetirizine (XYZAL) 5 MG tablet Take 5 mg by mouth every evening.    Yes [provider]  methocarbamol (ROBAXIN) 750 MG tablet Take 750 mg by mouth every 8 (eight) hours as needed for muscle spasms.  02/06/19  Yes [provider]  mometasone (NASONEX) 50 MCG/ACT  nasal spray Place 2 sprays into the nose daily as needed (allergies).  06/10/18  Yes [provider]  nitroGLYCERIN (NITROSTAT) 0.4 MG SL tablet PLACE 1 TABLET UNDER THE TONGUE EVERY 5 MINUTES AS NEEDED FOR CHEST PAIN. 08/27/19  Yes Croitoru, Mihai, MD  ondansetron (ZOFRAN) 4 MG tablet Take 4 mg by mouth every 8 (eight) hours as needed for nausea or vomiting.  04/08/18  Yes [provider]  pantoprazole (PROTONIX) 40 MG tablet Take 40 mg by mouth daily.   Yes [provider]  potassium chloride SA (KLOR-CON M20) 20 MEQ tablet Take 1 tablet (20 mEq total) by mouth 3 (three) times daily. 10/15/19  Yes Lockamy, Randi L, NP-C  simvastatin (ZOCOR) 20 MG tablet Take 1 tablet (20 mg total) by mouth daily at 6 PM. 11/24/19  Yes Croitoru, Mihai, MD  diltiazem (CARDIZEM CD) 120 MG 24 hr capsule Take 1 capsule (120 mg total) by mouth daily. Patient not taking: Reported on 12/09/2019 05/25/19   Croitoru, Mihai, MD  loperamide (IMODIUM) 2 MG capsule TAKE 2 CAPSULES BY MOUTH 2 TIMES DAILY AS NEEDED FOR DIARRHEA OR LOOSE STOOLS. Patient not taking: Reported on 10/28/2019 10/22/19   Noralyn Pick, NP  octreotide (SANDOSTATIN LAR DEPOT) 10 MG injection Inject 10 mg into the muscle every 28 (twenty-eight) days. 06/28/19   Noralyn Pick, NP  omeprazole (PRILOSEC) 20 MG capsule Take 20 mg by mouth daily.    [provider]    Past Medical History: Past Medical History:  Diagnosis Date  . Anemia   . Aortic aneurysm of unspecified site without mention of rupture   . Arthritis   . Bladder neck contracture   . Cancer ([place])   . Cerebral atherosclerosis    Carotid Doppler, 02/16/2013 - Bilateral Proximal ICAs,demonstrate mild plaque w/o evidence of significant diameter reduction, dissection, or any other vascular abnormality  . CHF (congestive heart failure) ([place])   . Complication of anesthesia   . COPD (chronic obstructive pulmonary disease) ([place])   . Coronary  artery disease   . Depression   . Esophageal reflux   . Heart disease   . Heart murmur   . Hx of bladder cancer 10/07/2012  . Hyperlipidemia   . Hypertension   . 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 Intestinovesical fistula   . Kidney stones    history  . Lung mass   . Multiple myeloma   . Myocardial infarction [facility])    '96  . Non Hodgkin's lymphoma ([place])   . Paroxysmal atrial fibrillation ([place]) 01/02/2016  . Peripheral arterial disease ([place])   . Personal history of other diseases of circulatory system   . PONV (postoperative nausea and vomiting)   . Prostate cancer ([place]) 2000  . Shingles   . Shortness of breath   . Sleep apnea    05-02-14 cpap , not yet used- suggested settings 5  . Stroke [facility]) 2013   Speech.  Past Surgical History: Past Surgical History:  Procedure Laterality Date  . BLADDER SURGERY    . BONE MARROW TRANSPLANT  2011  . CARDIOVERSION N/A 06/02/2019   Procedure: CARDIOVERSION;  Surgeon: Sanda Klein, MD;  Location: [facility] ENDOSCOPY;  Service: Cardiovascular;  Laterality: N/A;  . COLON SURGERY     colon resection  . COLONOSCOPY N/A 01/01/2013   Procedure: COLONOSCOPY;  Surgeon: Rogene Houston, MD;  Location: [facility] ENDO SUITE;  Service: Endoscopy;  Laterality: N/A;  825-moved to [place] notified pt  . COLONOSCOPY N/A 07/16/2018   Procedure: COLONOSCOPY;  Surgeon: Rogene Houston, MD;  Location: [facility] ENDO SUITE;  Service: Endoscopy;  Laterality: N/A;  1:25  . CORONARY ANGIOPLASTY  06/24/2000   PCI and stenting in mid & proximal RCA  . heart stents x 5  1999  . INGUINAL HERNIA REPAIR Right 05/04/2014   Procedure: OPEN RIGHT INGUINAL HERNIA REPAIR with mesh;  Surgeon: Edward Jolly, MD;  Location: [facility] ORS;  Service: General;  Laterality: Right;  . INSERT / REPLACE / REMOVE PACEMAKER    . left ear skin cancer removed    . NM MYOCAR PERF WALL MOTION  11/27/2007   inferior scar  . PACEMAKER  INSERTION  07/22/2011   [employer]  . POLYPECTOMY  07/16/2018   Procedure: POLYPECTOMY;  Surgeon: Rogene Houston, MD;  Location: [facility] ENDO SUITE;  Service: Endoscopy;;  colon  . PORTACATH PLACEMENT  07/26/2009   right chest  . PROSTATE SURGERY    . Rotator    . ROTATOR CUFF REPAIR Right   . SHOULDER ARTHROSCOPY WITH SUBACROMIAL DECOMPRESSION Right 07/21/2013   Procedure: RIGHT SHOULDER ARTHROSCOPY WITH SUBACROMIAL DECOMPRESSION AND DEBRIDEMENT & Injection of Left Shoulder;  Surgeon: Alta Corning, MD;  Location: [place];  Service: Orthopedics;  Laterality: Right;  . TEE WITHOUT CARDIOVERSION  10/13/2012   Procedure: TRANSESOPHAGEAL ECHOCARDIOGRAM (TEE);  Surgeon: Sanda Klein, MD;  Location: [facility] ENDOSCOPY;  Service: Cardiovascular;  Laterality: N/A;  pat/kay/echo notified  . US ECHOCARDIOGRAPHY  06/19/2011   RV mildly dilated,mild to mod. MR,mild AI,mild PI  . WRIST SURGERY     right    Family History: Family History  Problem Relation Age of Onset  . Cancer Father        bladder  . Heart disease Father        before age 18  . Hypertension Mother   . Cancer Brother   . Heart disease Brother        before age 63  . Heart disease Sister        before age 64  . Hyperlipidemia Sister   . Hypertension Sister   . Heart attack Sister   . Colon cancer Neg Hx   . Colon polyps Neg Hx     Social History: Social History   Socioeconomic History  . Marital status: Married    Spouse name: [name]  . Number of children: 3  . Years of education: 9th  . Highest education level: Not on file  Occupational History  . Occupation: retired   Tobacco Use  . Smoking status: Former Smoker    Packs/day: 1.00    Years: 20.00    Pack years: 20.00    Types: Cigarettes    Quit date: 11/14/1994    Years since quitting: 25.0  . Smokeless tobacco: Never Used  Substance and Sexual Activity  . Alcohol use: No    Alcohol/week: 0.0 standard drinks    Comment: previously drank but  none for at least 15  years.  . Drug use: No  . Sexual activity: Not on file  Other Topics Concern  . Not on file  Social History Narrative   Patient lives at home spouse.   Caffeine Use: Occasionally   Social Determinants of Health   Financial Resource Strain:   . Difficulty of Paying Living Expenses: Not on file  Food Insecurity:   . Worried About Charity fundraiser in the Last Year: Not on file  . Ran Out of Food in the Last Year: Not on file  Transportation Needs:   . Lack of Transportation (Medical): Not on file  . Lack of Transportation (Non-Medical): Not on file  Physical Activity:   . Days of Exercise per Week: Not on file  . Minutes of Exercise per Session: Not on file  Stress:   . Feeling of Stress : Not on file  Social Connections:   . Frequency of Communication with Friends and Family: Not on file  . Frequency of Social Gatherings with Friends and Family: Not on file  . Attends Religious Services: Not on file  . Active Member of Clubs or Organizations: Not on file  . Attends Archivist Meetings: Not on file  . Marital Status: Not on file    Allergies:  Allergies  Allergen Reactions  . Morphine And Related Other (See Comments)    hallucinations  . Tape Rash    Paper tape is ok    Objective:    Vital Signs:   Temp:  [95.2 F (35.1 C)-98.3 F (36.8 C)] 95.2 F (35.1 C) (01/28 2100) Pulse Rate:  [27-124] 44 (01/28 2220) Resp:  [14-41] 23 (01/28 2220) BP: (88-148)/(48-113) 92/76 (01/28 2220) SpO2:  [72 %-100 %] 75 % (01/28 2220) FiO2 (%):  [60 %-100 %] 60 % (01/29 0101) Weight:  [79.2 kg] 79.2 kg (01/28 0545) Last BM Date: (PTA)  Weight change: Filed Weights   12/09/19 0052 12/09/19 0545  Weight: 81.6 kg 79.2 kg    Intake/Output:   Intake/Output Summary (Last 24 hours) at 12/10/2019 0101 Last data filed at 12/09/2019 1700 Gross per 24 hour  Intake 623.94 ml  Output 500 ml  Net 123.94 ml      Physical Exam    General:  Critically-ill appearing and  mottled. On vent HEENT: normal + ETT Neck: supple. JVP to jaw . Carotids 2+ bilat; no bruits. No lymphadenopathy or thyromegaly appreciated. Cor: PMI laterally displaced. Regular rate & rhythm. +s3 Lungs: tachypneic + crackles Abdomen: soft, nontender, mildly distended. No hepatosplenomegaly. No bruits or masses. Good bowel sounds. Extremities: mottled. Cyanotic. Cold trace edema Neuro: intubated sedated   Telemetry   AF with v-pacing 70s Personally reviewed   EKG    AF with RVR 121 Personally reviewed   Labs   Basic Metabolic Panel: Recent Labs  Lab 12/09/19 0130 12/09/19 0930 12/09/19 2150 12/10/19 0058  NA 135 134* 136 135  K 4.4 4.2 5.8* 5.2*  CL 102 101 101  --   CO2 22 19* 10*  --   GLUCOSE 128* 150* 115*  --   BUN 36* 38* 40*  --   CREATININE 1.35* 1.48* 2.14*  --   CALCIUM 9.6 9.3 9.2  --   MG  --  2.5*  --   --     Liver Function Tests: Recent Labs  Lab 12/09/19 0130 12/09/19 2150  AST 78* 796*  ALT 70* 694*  ALKPHOS 83 75  BILITOT 0.9  2.2*  PROT 7.6 7.0  ALBUMIN 3.8 3.3*   No results for input(s): LIPASE, AMYLASE in the last 168 hours. No results for input(s): AMMONIA in the last 168 hours.  CBC: Recent Labs  Lab 12/09/19 0130 12/10/19 0058  WBC 7.2  --   NEUTROABS 5.2  --   HGB 11.9* 12.9*  HCT 39.4 38.0*  MCV 91.4  --   PLT 133*  --     Cardiac Enzymes: No results for input(s): CKTOTAL, CKMB, CKMBINDEX, TROPONINI in the last 168 hours.  BNP: BNP (last 3 results) Recent Labs    12/09/19 0130  BNP 2,803.0*    ProBNP (last 3 results) No results for input(s): PROBNP in the last 8760 hours.   CBG: No results for input(s): GLUCAP in the last 168 hours.  Coagulation Studies: Recent Labs    12/09/19 0930  LABPROT 19.9*  INR 1.7*     Imaging   DG CHEST PORT 1 VIEW  Result Date: 12/09/2019 CLINICAL DATA:  Shortness of breath EXAM: PORTABLE CHEST 1 VIEW COMPARISON:  Chest radiograph dated 12/09/2019 FINDINGS: A left  subclavian approach cardiac device is redemonstrated. A right internal jugular central venous port catheter is unchanged in position, however the tip is obscured. The heart remains enlarged. Vascular calcifications are seen in the aortic arch. Mild bibasilar atelectasis is unchanged. No pleural effusion or pneumothorax. IMPRESSION: Unchanged cardiomegaly and mild bibasilar atelectasis. Electronically Signed   By: Zerita Boers M.D.   On: 12/09/2019 21:33   DG Chest Portable 1 View  Result Date: 12/09/2019 CLINICAL DATA:  Shortness of breath EXAM: PORTABLE CHEST 1 VIEW COMPARISON:  12/06/2019 FINDINGS: Cardiomegaly. Right Port-A-Cath and left pacer remain in place, unchanged. Bibasilar scarring or atelectasis. No confluent opacities, effusions or edema. IMPRESSION: Cardiomegaly.  Bibasilar scarring or atelectasis. Electronically Signed   By: Rolm Baptise M.D.   On: 12/09/2019 01:57   ECHOCARDIOGRAM COMPLETE  Result Date: 12/09/2019   ECHOCARDIOGRAM REPORT   Patient Name:   Jeremy Parrish Date of Exam: 12/09/2019 Medical Rec #:  [id number]       Height:       67.0 in Accession #:    [id number]      Weight:       174.6 lb Date of Birth:  Dec 14, 1940       BSA:          1.91 m Patient Age:    82 years        BP:           120/79 mmHg Patient Gender: M               HR:           98 bpm. Exam Location:  [facility] Procedure: 2D Echo, Cardiac Doppler and Color Doppler Indications:    I48.0 Paroxysmal atrial fibrillation  History:        Patient has prior history of Echocardiogram examinations, most                 recent 11/13/2018. CHF, CAD and Previous Myocardial Infarction,                 Stroke and COPD, Arrythmias:Atrial Fibrillation,                 Signs/Symptoms:Dyspnea and Murmur; Risk Factors:Hypertension,                 Dyslipidemia and Sleep Apnea. Aortic Aneurysm.  Sonographer:  [place] Referring Phys: [id number] [place]  1. Left ventricular ejection fraction, by visual  estimation, is 30 to 35%. The left ventricle has severely decreased function. There is mildly increased left ventricular hypertrophy.  2. Left ventricular diastolic function could not be evaluated.  3. The left ventricle demonstrates global hypokinesis.  4. Global right ventricle has mildly reduced systolic function.The right ventricular size is moderately enlarged. No increase in right ventricular wall thickness.  5. Left atrial size was severely dilated.  6. Right atrial size was moderately dilated.  7. The mitral valve is grossly normal. Moderate mitral valve regurgitation.  8. The tricuspid valve is grossly normal.  9. The tricuspid valve is grossly normal. Tricuspid valve regurgitation is moderate. 10. The aortic valve is tricuspid. Aortic valve regurgitation is mild. No evidence of aortic valve sclerosis or stenosis. 11. Pulmonic regurgitation is mild. 12. The pulmonic valve was grossly normal. Pulmonic valve regurgitation is mild. 13. Mildly elevated pulmonary artery systolic pressure. 14. A pacer wire is visualized. 15. The inferior vena cava is dilated in size with <50% respiratory variability, suggesting right atrial pressure of 15 mmHg. 16. The interatrial septum was not well visualized. FINDINGS  Left Ventricle: Left ventricular ejection fraction, by visual estimation, is 30 to 35%. The left ventricle has severely decreased function. The left ventricle demonstrates global hypokinesis. There is mildly increased left ventricular hypertrophy. Septal left ventricular hypertrophy. The left ventricular diastology could not be evaluated due to atrial fibrillation. Left ventricular diastolic function could not be evaluated. Right Ventricle: The right ventricular size is moderately enlarged. No increase in right ventricular wall thickness. Global RV systolic function is has mildly reduced systolic function. The tricuspid regurgitant velocity is 2.49 m/s, and with an assumed right atrial pressure of 15 mmHg, the  estimated right ventricular systolic pressure is mildly elevated at 39.7 mmHg. Left Atrium: Left atrial size was severely dilated. Right Atrium: Right atrial size was moderately dilated Pericardium: There is no evidence of pericardial effusion. Mitral Valve: The mitral valve is grossly normal. There is mild thickening of the mitral valve leaflet(s). Moderate mitral valve regurgitation. Tricuspid Valve: The tricuspid valve is grossly normal. Tricuspid valve regurgitation is moderate. Aortic Valve: The aortic valve is tricuspid. Aortic valve regurgitation is mild. The aortic valve is structurally normal, with no evidence of sclerosis or stenosis. Mild aortic valve annular calcification. Pulmonic Valve: The pulmonic valve was grossly normal. Pulmonic valve regurgitation is mild. Pulmonic regurgitation is mild. Aorta: The aortic root is normal in size and structure. Venous: The inferior vena cava is dilated in size with less than 50% respiratory variability, suggesting right atrial pressure of 15 mmHg. IAS/Shunts: The interatrial septum was not well visualized. Additional Comments: A pacer wire is visualized.  LEFT VENTRICLE PLAX 2D LVIDd:         7.22 cm LVIDs:         6.14 cm LV PW:         0.99 cm LV IVS:        1.11 cm LVOT diam:     2.30 cm LV SV:         84 ml LV SV Index:   43.10 LVOT Area:     4.15 cm  RIGHT VENTRICLE            IVC RV Basal diam:  4.13 cm    IVC diam: 2.53 cm RV Mid diam:    2.20 cm RV S prime:     9.79 cm/s  TAPSE (M-mode): 2.1 cm LEFT ATRIUM              Index       RIGHT ATRIUM           Index LA diam:        6.40 cm  3.35 cm/m  RA Area:     32.90 cm LA Vol (A2C):   170.0 ml 89.06 ml/m RA Volume:   121.50 ml 63.65 ml/m LA Vol (A4C):   134.0 ml 70.20 ml/m LA Biplane Vol: 152.0 ml 79.63 ml/m  AORTIC VALVE LVOT Vmax:   46.70 cm/s LVOT Vmean:  28.100 cm/s LVOT VTI:    0.063 m  AORTA Ao Root diam: 3.60 cm Ao Asc diam:  3.50 cm MITRAL VALVE                       TRICUSPID VALVE MV Area  (PHT): 3.91 cm            TR Peak grad:   24.7 mmHg MV PHT:        56.26 msec          TR Vmax:        257.00 cm/s MV Decel Time: 194 msec MV E velocity: 66.45 cm/s 103 cm/s SHUNTS                                    Systemic VTI:  0.06 m                                    Systemic Diam: 2.30 cm  Kate Sable MD Electronically signed by Kate Sable MD Signature Date/Time: 12/09/2019/10:32:42 AM    Final    [place] EKG SITE RITE  Result Date: 12/09/2019 If Site Rite image not attached, placement could not be confirmed due to current cardiac rhythm.     Medications:     Current Medications: . atorvastatin  10 mg Oral q1800  . Chlorhexidine Gluconate Cloth  6 each Topical Daily  . clopidogrel  75 mg Oral Daily  . dextrose  50 mL Intravenous Once  . EPINEPHrine  1 mg Intravenous Once  . fentaNYL      . furosemide  40 mg Intravenous Daily  . lenalidomide  10 mg Oral Daily  . [START ON 12/22/2019] lenalidomide  10 mg Oral Daily  . mouth rinse  15 mL Mouth Rinse BID  . metoprolol tartrate  12.5 mg Oral BID  . midazolam      . pantoprazole (PROTONIX) IV  40 mg Intravenous Q24H  . sodium bicarbonate  100 mEq Intravenous Once  . sodium bicarbonate  100 mEq Intravenous Once  . sodium chloride flush  3 mL Intravenous Q12H     Infusions: . sodium chloride    . amiodarone 30 mg/hr (12/09/19 2031)  . DOBUTamine 2.5 mcg/kg/min (12/09/19 2330)  . epinephrine    . fentaNYL infusion INTRAVENOUS    . heparin 1,200 Units/hr (12/09/19 1700)  . midazolam    . norepinephrine (LEVOPHED) Adult infusion 4 mcg/min (12/09/19 2323)  .  sodium bicarbonate  infusion 1000 mL       Assessment/Plan   1. Cardiogenic shock - EF 30-35% by echo (newly down) - Unclear etiology ? AF vs ischemia.  Compounded by IV diltiazem  - Lactate > 11 -  Co-ox now improved on NE & DBA. Will continue - Follow co-ox and CVPs - start bicarb gtt - Prognosis guarded  2. Acute systolic HF - management as above - if  recovers will need R/L cath  3. Acute hypoxic respiratory failure - intubated - CCM on board   4. CAD - s/p previous PCIs - hs trop 30 -> 43 -> 43. Not c/w ACS  5. AKI - Creatinine 1.3-> 2.1 - Due to ATN from shock - Suspect will continue to get worse for a bit - Continue hemodynamic support  6. PAF - IV amio. - heparin - Interrogate MDT pacer in am   7. Multiple myeloma - [facility] at [facility]  8. Dementia - unclear how severe this is   Full Code for now. Family understands gravity of situation and he would not want prolonged support.   Addendum: Discussed with family again. Will make limited code. No CPR or defibrillation. (Cardioversion for AF or VT ok).   CRITICAL CARE Performed by: Glori Bickers  Total critical care time: 75 minutes  Critical care time was exclusive of separately billable procedures and treating other patients.  Critical care was necessary to treat or prevent imminent or life-threatening deterioration.  Critical care was time spent personally by me (independent of midlevel providers or residents) on the following activities: development of treatment plan with patient and/or surrogate as well as nursing, discussions with consultants, evaluation of patient's response to treatment, examination of patient, obtaining history from patient or surrogate, ordering and performing treatments and interventions, ordering and review of laboratory studies, ordering and review of radiographic studies, pulse oximetry and re-evaluation of patient's condition.    Length of Stay: 1  Glori Bickers, MD  12/10/2019, 1:01 AM  Advanced Heart Failure Team Pager [phone] (M-F; 7a - 4p)  Please contact [facility] for night-coverage after hours (4p -7a ) and weekends on [email or web address]

## 2019-12-10 NOTE — Anesthesia Procedure Notes (Signed)
Procedure Name: Intubation Date/Time: 12/10/2019 12:08 AM Performed by: Clovis Cao, CRNA Pre-anesthesia Checklist: Patient identified, Emergency Drugs available, Suction available, Patient being monitored and Timeout performed Patient Re-evaluated:Patient Re-evaluated prior to induction Oxygen Delivery Method: Ambu bag Preoxygenation: Pre-oxygenation with 100% oxygen Induction Type: IV induction, Rapid sequence and Cricoid Pressure applied Laryngoscope Size: Glidescope Grade View: Grade I Tube type: Oral Tube size: 8.0 mm Number of attempts: 1 Airway Equipment and Method: Stylet and Video-laryngoscopy Placement Confirmation: ETT inserted through vocal cords under direct vision,  CO2 detector and breath sounds checked- equal and bilateral Secured at: 23 cm Tube secured with: Tape Dental Injury: Teeth and Oropharynx as per pre-operative assessment

## 2019-12-10 NOTE — Transfer of Care (Signed)
Immediate Anesthesia Transfer of Care Note  Patient: Jeremy Parrish  Procedure(s) Performed: AN AD [place]  Patient Location: ICU  Anesthesia Type:General  Level of Consciousness: sedated and Patient remains intubated per anesthesia plan  Airway & Oxygen Therapy: Patient remains intubated per anesthesia plan and Patient placed on Ventilator (see vital sign flow sheet for setting)  Post-op Assessment: Report given to RN and Post -op Vital signs reviewed and stable  Post vital signs: Reviewed and stable  Last Vitals:  Vitals Value Taken Time  BP 95/70 12/10/19 0010  Temp    Pulse    Resp 25 12/10/19 0015  SpO2    Vitals shown include unvalidated device data.  Last Pain:  Vitals:   12/09/19 2100  TempSrc: Rectal  PainSc:          Complications: No apparent anesthesia complications

## 2019-12-10 NOTE — Progress Notes (Signed)
[facility] Progress Note Patient Name: Jeremy Parrish DOB: Mar 28, 1941 MRN: [id number]   Date of Service  12/10/2019  HPI/Events of Note  Glucose 200s-300s, no diagnosis of DM but on systemic steroids and tolerating tube feeding. Anion gap metabolic acidosis which could be explained by significant lactic acidosis an renal insufficiency.  eICU Interventions  Will start moderate dose SSI and check HBA1C. Also getting UA for presence of ketones     Intervention Category Major Interventions: Hyperglycemia - active titration of insulin therapy;Acid-Base disturbance - evaluation and management  Judd Lien 12/10/2019, 8:50 PM

## 2019-12-10 NOTE — Progress Notes (Signed)
At bedside to reaccess PAC.  Pt converted to VTach while in the room, Dr Ashley Mariner and [place] at bedside. Wife states with confidence that PAC was accessed 12/09/19 in the ED at [facility].  Will date reaccess to be a week from yesterday.

## 2019-12-10 NOTE — Significant Event (Signed)
Called by RN around 9:30 for hypothermia.   Arrived to bedside to find patient tachypneic to 40s with audible rhonchi.  BP initially 110s/80.  Extremities noted to be mottled and cool. Patient agitated - pulling at lines, though somewhat redirectable.  Ordered STAT CXR notable for cardiomegaly and some interval improvement in pulmonary edema.  ABG performed and notable for mixed metabolic acidosis and respiratory alkalosis (7.1/20). Bicarb 8.  Bipap placed for work of breathing and tachypnea.  Labs including lactate and CMP ordered.   Given newly reduced EF, ordered dobutamine and norepinephrine (RRT nurse at bedside) for inotropic support.  Patient transferred to [facility] given worsening respiratory status and invasive blood pressure monitoring for initiation of inodilator therapies.    Upon arrival, intubated for airway protection.  Lactate returned at 11 with significant worsening of AKI and ALI all consistent with SCAI D to E cardiogenic shock.   Dr. Haroldine Laws at bedside to place a-line and obtain central access.   Starla Deller K. Marletta Lor, MD

## 2019-12-11 ENCOUNTER — Other Ambulatory Visit: Payer: Self-pay

## 2019-12-11 DIAGNOSIS — J9601 Acute respiratory failure with hypoxia: Secondary | ICD-10-CM

## 2019-12-11 DIAGNOSIS — R579 Shock, unspecified: Secondary | ICD-10-CM

## 2019-12-11 LAB — BASIC METABOLIC PANEL
Anion gap: 16 — ABNORMAL HIGH (ref 5–15)
BUN: 65 mg/dL — ABNORMAL HIGH (ref 8–23)
CO2: 33 mmol/L — ABNORMAL HIGH (ref 22–32)
Calcium: 6.7 mg/dL — ABNORMAL LOW (ref 8.9–10.3)
Chloride: 88 mmol/L — ABNORMAL LOW (ref 98–111)
Creatinine, Ser: 3.01 mg/dL — ABNORMAL HIGH (ref 0.61–1.24)
GFR calc Af Amer: 22 mL/min — ABNORMAL LOW (ref >60)
GFR calc non Af Amer: 19 mL/min — ABNORMAL LOW (ref >60)
Glucose, Bld: 204 mg/dL — ABNORMAL HIGH (ref 70–99)
Potassium: 3.2 mmol/L — ABNORMAL LOW (ref 3.5–5.1)
Sodium: 137 mmol/L (ref 135–145)

## 2019-12-11 LAB — POCT I-STAT 7, (LYTES, BLD GAS, ICA,H+H)
Acid-Base Excess: 14 mmol/L — ABNORMAL HIGH (ref 0.0–2.0)
Bicarbonate: 36.1 mmol/L — ABNORMAL HIGH (ref 20.0–28.0)
Calcium, Ion: 0.81 mmol/L — CL (ref 1.15–1.40)
HCT: 34 % — ABNORMAL LOW (ref 39.0–52.0)
Hemoglobin: 11.6 g/dL — ABNORMAL LOW (ref 13.0–17.0)
O2 Saturation: 96 %
Patient temperature: 36.8
Potassium: 3.2 mmol/L — ABNORMAL LOW (ref 3.5–5.1)
Sodium: 140 mmol/L (ref 135–145)
TCO2: 37 mmol/L — ABNORMAL HIGH (ref 22–32)
pCO2 arterial: 34.5 mmHg (ref 32.0–48.0)
pH, Arterial: 7.628 (ref 7.350–7.450)
pO2, Arterial: 68 mmHg — ABNORMAL LOW (ref 83.0–108.0)

## 2019-12-11 LAB — CBC
HCT: 34.1 % — ABNORMAL LOW (ref 39.0–52.0)
HCT: 33.7 % — ABNORMAL LOW (ref 39.0–52.0)
Hemoglobin: 10.7 g/dL — ABNORMAL LOW (ref 13.0–17.0)
Hemoglobin: 10.7 g/dL — ABNORMAL LOW (ref 13.0–17.0)
MCH: 27.5 pg (ref 26.0–34.0)
MCH: 28.2 pg (ref 26.0–34.0)
MCHC: 31.4 g/dL (ref 30.0–36.0)
MCHC: 31.8 g/dL (ref 30.0–36.0)
MCV: 87.7 fL (ref 80.0–100.0)
MCV: 88.7 fL (ref 80.0–100.0)
Platelets: 72 10*3/uL — ABNORMAL LOW (ref 150–400)
Platelets: 61 10*3/uL — ABNORMAL LOW (ref 150–400)
RBC: 3.89 MIL/uL — ABNORMAL LOW (ref 4.22–5.81)
RBC: 3.8 MIL/uL — ABNORMAL LOW (ref 4.22–5.81)
RDW: 19.2 % — ABNORMAL HIGH (ref 11.5–15.5)
RDW: 19.1 % — ABNORMAL HIGH (ref 11.5–15.5)
WBC: 9 10*3/uL (ref 4.0–10.5)
WBC: 8 10*3/uL (ref 4.0–10.5)
nRBC: 1.8 % — ABNORMAL HIGH (ref 0.0–0.2)
nRBC: 1 % — ABNORMAL HIGH (ref 0.0–0.2)

## 2019-12-11 LAB — HEPATIC FUNCTION PANEL
ALT: 3197 U/L — ABNORMAL HIGH (ref 0–44)
AST: 3995 U/L — ABNORMAL HIGH (ref 15–41)
Albumin: 2.5 g/dL — ABNORMAL LOW (ref 3.5–5.0)
Alkaline Phosphatase: 70 U/L (ref 38–126)
Bilirubin, Direct: 1 mg/dL — ABNORMAL HIGH (ref 0.0–0.2)
Indirect Bilirubin: 2.7 mg/dL — ABNORMAL HIGH (ref 0.3–0.9)
Total Bilirubin: 3.7 mg/dL — ABNORMAL HIGH (ref 0.3–1.2)
Total Protein: 5.4 g/dL — ABNORMAL LOW (ref 6.5–8.1)

## 2019-12-11 LAB — GLUCOSE, CAPILLARY
Glucose-Capillary: 164 mg/dL — ABNORMAL HIGH (ref 70–99)
Glucose-Capillary: 168 mg/dL — ABNORMAL HIGH (ref 70–99)
Glucose-Capillary: 184 mg/dL — ABNORMAL HIGH (ref 70–99)
Glucose-Capillary: 185 mg/dL — ABNORMAL HIGH (ref 70–99)
Glucose-Capillary: 189 mg/dL — ABNORMAL HIGH (ref 70–99)
Glucose-Capillary: 239 mg/dL — ABNORMAL HIGH (ref 70–99)

## 2019-12-11 LAB — POTASSIUM: Potassium: 3.9 mmol/L (ref 3.5–5.1)

## 2019-12-11 LAB — MAGNESIUM
Magnesium: 1.9 mg/dL (ref 1.7–2.4)
Magnesium: 2.2 mg/dL (ref 1.7–2.4)

## 2019-12-11 LAB — COOXEMETRY PANEL
Carboxyhemoglobin: 1.1 % (ref 0.5–1.5)
Methemoglobin: 0.6 % (ref 0.0–1.5)
O2 Saturation: 60.4 %
Total hemoglobin: 11 g/dL — ABNORMAL LOW (ref 12.0–16.0)

## 2019-12-11 LAB — LACTIC ACID, PLASMA
Lactic Acid, Venous: 3.2 mmol/L (ref 0.5–1.9)
Lactic Acid, Venous: 3.1 mmol/L (ref 0.5–1.9)

## 2019-12-11 LAB — VANCOMYCIN, RANDOM: Vancomycin Rm: 17

## 2019-12-11 LAB — PHOSPHORUS
Phosphorus: 4.2 mg/dL (ref 2.5–4.6)
Phosphorus: 4.4 mg/dL (ref 2.5–4.6)

## 2019-12-11 MED ORDER — MAGNESIUM SULFATE 2 GM/50ML IV SOLN
2.0000 g | Freq: Once | INTRAVENOUS | Status: AC
  Administered 2019-12-11: 2 g via INTRAVENOUS
  Filled 2019-12-11: qty 50

## 2019-12-11 MED ORDER — POTASSIUM CHLORIDE 20 MEQ PO PACK
40.0000 meq | PACK | Freq: Once | ORAL | Status: AC
  Administered 2019-12-11: 40 meq
  Filled 2019-12-11: qty 2

## 2019-12-11 MED ORDER — VANCOMYCIN HCL 1250 MG/250ML IV SOLN
1250.0000 mg | Freq: Once | INTRAVENOUS | Status: AC
  Administered 2019-12-11: 1250 mg via INTRAVENOUS
  Filled 2019-12-11: qty 250

## 2019-12-11 MED ORDER — POTASSIUM CHLORIDE 20 MEQ/15ML (10%) PO SOLN
20.0000 meq | Freq: Once | ORAL | Status: AC
  Administered 2019-12-11: 20 meq
  Filled 2019-12-11: qty 15

## 2019-12-11 NOTE — Progress Notes (Signed)
[place] Progress Note Patient Name: Jeremy Parrish DOB: [birth year] MRN: [id number]   Date of Service  12/11/2019  HPI/Events of Note  Notified of K 3.2, Mg 1.9, creatinine 3 CO2 33 on bicarb drip  eICU Interventions   Discontinued bicarb drip as already alkalotic and will aggravate hypokalemia  Responded significantly to diureses. Consider holding am dose of furosemide  Ordered K 20 meqs     Intervention Category Intermediate Interventions: Electrolyte abnormality - evaluation and management  Judd Lien 12/11/2019, 6:09 AM

## 2019-12-11 NOTE — Progress Notes (Signed)
NAME:  Jeremy Parrish, MRN:  [id number], DOB:  [birth year], LOS: 2 ADMISSION DATE:  12/09/2019, CONSULTATION DATE:  12/09/18 REFERRING MD:  Bronson Ing  CHIEF COMPLAINT:  Vent Management   Brief History   Jeremy Parrish is a 79 y/o male, with a PMH of aortic aneurysm, depression, bladder cancer, multiple myeloma, who presented from [facility] on 1/28 with A. Fib with RVR. On 1/29 he developed shock and was intubated and placed levophed and dobutamine. ICU was consulted for vent management.  Past Medical History   Past Medical History:  Diagnosis Date  . Anemia   . Aortic aneurysm of unspecified site without mention of rupture   . Arthritis   . Bladder neck contracture   . Cancer ([place])   . Cerebral atherosclerosis    Carotid Doppler, 02/16/2013 - Bilateral Proximal ICAs,demonstrate mild plaque w/o evidence of significant diameter reduction, dissection, or any other vascular abnormality  . CHF (congestive heart failure) ([place])   . Complication of anesthesia   . COPD (chronic obstructive pulmonary disease) ([place])   . Coronary artery disease   . Depression   . Esophageal reflux   . Heart disease   . Heart murmur   . Hx of bladder cancer 10/07/2012  . Hyperlipidemia   . Hypertension   . 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 Intestinovesical fistula   . Kidney stones    history  . Lung mass   . Multiple myeloma   . Myocardial infarction [facility])    '96  . Non Hodgkin's lymphoma ([place])   . Paroxysmal atrial fibrillation ([place]) 01/02/2016  . Peripheral arterial disease ([place])   . Personal history of other diseases of circulatory system   . PONV (postoperative nausea and vomiting)   . Prostate cancer ([place]) 2000  . Shingles   . Shortness of breath   . Sleep apnea    05-02-14 cpap , not yet used- suggested settings 5  . Stroke [facility]) 2013   Speech.     Significant Hospital Events   1/28 > Admission 1/29 > O/N Intubated 2/2 to  cardiac shock. Pressures stabilizing.   Consults:  PCCM.  Procedures:  ETT 1/29 >  R radial art line 1/29 >  R IJ CVC 1/29 >   Significant Diagnostic Tests:  Echo 1/28 > EF 30-35%, mild LVH, mildly reduced RV systolic function.  Severely dilated LA, mod dilated RA.  Micro Data:  SARS CoV2 1/28 > neg. Flu 1/28 > neg. Blood 129 >> Respiratory 1/29 >> CBC, GPR, GNC >>   Antimicrobials:  Cefepime 1/29 >>  vanco 1/29 >>   Interim history/subjective:  Dobutamine stopped 1/29 Epinephrine 1 Norepinephrine 7 Vasopressin 0.04 Amiodarone infusing Fentanyl and Versed Current vent settings 0.40, PEEP 8 Broad-spectrum antibiotics added on 1/29 as above I/O+ 3.5 L total  Bicarbonate drip stopped overnight Sliding-scale insulin started overnight Morning Lasix held SvO2 60.4% this am (off dobuta)  Objective:  Blood pressure 112/76, pulse 80, temperature 97.9 F (36.6 C), resp. rate 20, height _0  (1.702 m), weight 83.3 kg, SpO2 95 %. CVP:  [9 mmHg-14 mmHg] 10 mmHg  Vent Mode: PRVC FiO2 (%):  [40 %-50 %] 40 % Set Rate:  [20 bmp] 20 bmp Vt Set:  [520 mL] 520 mL PEEP:  [8 cmH20-10 cmH20] 8 cmH20 Plateau Pressure:  [19 cmH20-21 cmH20] 21 cmH20   Intake/Output Summary (Last 24 hours) at 12/11/2019 0707 Last data filed at 12/11/2019 0600 Gross  per 24 hour  Intake 5887.62 ml  Output 4605 ml  Net 1282.62 ml   Filed Weights   12/09/19 0545 12/10/19 0630 12/11/19 0500  Weight: 79.2 kg 82.8 kg 83.3 kg    Examination: General: intubated, sedated, ill appearing, diaphoretic  Neuro: intubated and sedated HEENT: normocephalic, atraumatic Cardiovascular: RRR, no murmurs, rubs, gallops Lungs: CTAB, no wheezes, rales, rhonchi Abdomen: non distended, BS +, non tender Musculoskeletal: No pitting edema in the lower extremities bilaterally Skin: well perfused, dry andcool  Chest x-ray from 1/29 reviewed by me, hardware in good position, left basilar atelectasis versus infiltrate  noted  Assessment & Plan:   Acute Hypoxic Respiratory Failure also due to shock, lactic acidosis:  Patient with newfound LVEF of 30-35% with RV dysfunction from 40-45% (11/2018) Continue current mechanical ventilation, PRVC 8 cc/kg Adjust minute ventilation given mixed acidosis, lactic acid Follow ABG, chest x-ray VAP prevention order set  Multifactorial shock, cardiogenic and septic with associated Multi Organ Failure: Initial presentation consistent with cardiogenic shock, A. fib with RVR.  On therapy (including dobutamine) note SVR low, cardiac output improved >> probable septic component.  No source identified Dobutamine discontinued on 1/29, note significant decrease SvO2.  Question whether there may be a role to restart depending on our success with weaning other pressors Continue wean pressors, epinephrine first, then norepinephrine.  Continue vasopressin at stable shock dose Stress dose steroids added 1/29, continue Follow CVP, SVO2 Continue empiric antibiotics and tailor to culture data as available.  No current source Hold diuresis 1/30  A. Fib with RVR: Patient with PMH of A Fib IV amiodarone Not currently on systemic anticoagulation, consider restart as stabilizes Ultimately would benefit from right and left heart catheterization, repeat echocardiogram to assess for improvement in LV function (question septic component to his cardiomyopathy)  AG Metabolic Acidosis, due to lactic acidosis, acute renal failure.  Follow lactate for clearance Sodium bicarbonate discontinued 1/29 morning With hyperglycemia watch for any evolving contribution DKA  Multiple Myeloma:  Lenalidomide held, on IVIG infusions outpatient Follow CBC Question whether his port could be a septic source  Acute renal failure due to shock, ATN, hypoperfusion state:  Follow urine output, BMP Renally dose medications, avoid nephrotoxins Maintain adequate renal perfusion Notes and discussions reviewed from  [place], cardiology.  Patient would not want escalation or CVVHD  Hypomagnesemia, hypocalcemia, hypokalemia Follow BMP Replace electrolytes as indicated  Transaminitis Follow LFT, coags  Thrombocytopenia.  Platelets have been up and down but significant drop over last 24 hours Continue to follow CBC, if further drop then would stop his subcutaneous heparin, possibly substitute with alternative anticoagulation, check DIC and HITT panel   Best Practice:  Diet: NPO. Pain/Anxiety/Delirium protocol (if indicated): fentanyl gtt / midazolam gtt.  RASS goal -1. VAP protocol (if indicated): In place. DVT prophylaxis: SCD's /heparin subcutaneous GI prophylaxis: PPI. Glucose control: Sliding-scale insulin moderate scale Mobility: Bedrest. Code Status: Limited.  No CPR, no escalation.  Cardioversion or defib for acute arrhythmia acceptable Family Communication: discussed status and plans with his wife by phone 1/30.  Disposition: ICU.  Labs   CBC: Recent Labs  Lab 12/09/19 0130 12/09/19 0130 12/10/19 0058 12/10/19 0139 12/10/19 0419 12/10/19 1458 12/11/19 0419  WBC 7.2  --   --   --   --   --  9.0  NEUTROABS 5.2  --   --   --   --   --   --   HGB 11.9*   < > 12.9* 11.6* 11.2* 11.9*  10.7*  HCT 39.4   < > 38.0* 34.0* 33.0* 35.0* 34.1*  MCV 91.4  --   --   --   --   --  87.7  PLT 133*  --   --   --   --   --  72*   < > = values in this interval not displayed.   Basic Metabolic Panel: Recent Labs  Lab 12/09/19 0130 12/09/19 0130 12/09/19 0930 12/09/19 0930 12/09/19 2150 12/10/19 0058 12/10/19 0139 12/10/19 0419 12/10/19 0457 12/10/19 1453 12/10/19 1458 12/10/19 1733 12/11/19 0419  NA 135   < > 134*   < > 136   < > 138 140 140  --  138  --  137  K 4.4   < > 4.2   < > 5.8*   < > 4.5 3.7 3.9  --  3.4*  --  3.2*  CL 102  --  101  --  101  --   --   --  97*  --   --   --  88*  CO2 22  --  19*  --  10*  --   --   --  9*  --   --   --  33*  GLUCOSE 128*  --  150*  --  115*  --    --   --  206*  --   --   --  204*  BUN 36*  --  38*  --  40*  --   --   --  44*  --   --   --  65*  CREATININE 1.35*  --  1.48*  --  2.14*  --   --   --  2.70*  --   --   --  3.01*  CALCIUM 9.6  --  9.3  --  9.2  --   --   --  7.7*  --   --   --  6.7*  MG  --   --  2.5*  --   --   --   --   --  2.2 2.1  --  2.1 1.9  PHOS  --   --   --   --   --   --   --   --   --  4.7*  --  4.5 4.2   < > = values in this interval not displayed.   GFR: Estimated Creatinine Clearance: 21.2 mL/min (A) (by C-G formula based on SCr of 3.01 mg/dL (H)). Recent Labs  Lab 12/09/19 0130 12/09/19 2151 12/10/19 0103 12/10/19 0457 12/10/19 1128 12/11/19 0419  WBC 7.2  --   --   --   --  9.0  LATICACIDVEN  --  >11.0* >11.0* >11.0* 9.8*  --    Liver Function Tests: Recent Labs  Lab 12/09/19 0130 12/09/19 2150 12/10/19 0457  AST 78* 796* 3,286*  ALT 70* 694* 2,140*  ALKPHOS 83 75 77  BILITOT 0.9 2.2* 2.5*  PROT 7.6 7.0 5.2*  ALBUMIN 3.8 3.3* 2.6*   No results for input(s): LIPASE, AMYLASE in the last 168 hours. No results for input(s): AMMONIA in the last 168 hours. ABG    Component Value Date/Time   PHART 7.485 (H) 12/10/2019 1458   PCO2ART 35.7 12/10/2019 1458   PO2ART 69.0 (L) 12/10/2019 1458   HCO3 26.8 12/10/2019 1458   TCO2 28 12/10/2019 1458   ACIDBASEDEF 19.0 (H) 12/10/2019 0419   O2SAT 60.4 12/11/2019 0419  Coagulation Profile: Recent Labs  Lab 12/09/19 0930  INR 1.7*   Cardiac Enzymes: No results for input(s): CKTOTAL, CKMB, CKMBINDEX, TROPONINI in the last 168 hours. HbA1C: Hgb A1c MFr Bld  Date/Time Value Ref Range Status  12/10/2019 09:07 PM 5.6 4.8 - 5.6 % Final    Comment:    (NOTE) Pre diabetes:          5.7%-6.4% Diabetes:              >6.4% Glycemic control for   <7.0% adults with diabetes   01/08/2019 10:57 AM 5.6 4.8 - 5.6 % Final    Comment:    (NOTE) Pre diabetes:          5.7%-6.4% Diabetes:              >6.4% Glycemic control for   <7.0% adults with  diabetes    CBG: Recent Labs  Lab 12/10/19 1945 12/10/19 2357 12/11/19 0342  GLUCAP 313* 239* 185*      Critical care time: 45 min.    Baltazar Apo, MD, PhD 12/11/2019, 7:39 AM [facility] [phone] or if no answer [phone]

## 2019-12-11 NOTE — Progress Notes (Signed)
Chaplain responded to call from nurse.  Patient's wife requested prayer and support.  Today is their wedding anniversary, married 41 years.  Doristine Bosworth of the [employer] in [place] has been supporting them via phone.  Chaplain offered words of scripture and prayer and presence.  Will refer to unit chaplain.  Rev. Tamsen Snider Pager [phone]

## 2019-12-11 NOTE — Progress Notes (Signed)
Pharmacy Antibiotic Note  Jeremy Parrish is a 79 y.o. male admitted on 12/09/2019 with respiratory failure.  Pharmacy has been consulted for Vancomycin/Cefepime dosing for r/o sepsis. WBC ok but on chemo/IVIG, renal function worsening. Tm 99.3  Vancomycin random level this AM = 17 - drawn about 24 hrs after initial dose of 1500 mg x 1.  Plan: Redose vancomycin 1250 mg x 1 now. Cefepime 2g IV q24h Will recheck vancomycin random level tomorrow AM given changing renal function. F/u cultures.   Height: 5\' 7"  (170.2 cm) Weight: 183 lb 10.3 oz (83.3 kg) IBW/kg (Calculated) : 66.1  Temp (24hrs), Avg:98.3 F (36.8 C), Min:97.2 F (36.2 C), Max:99.3 F (37.4 C)  Recent Labs  Lab 12/09/19 0130 12/09/19 0930 12/09/19 2150 12/09/19 2151 12/10/19 0103 12/10/19 0457 12/10/19 1128 12/11/19 0419 12/11/19 0708  WBC 7.2  --   --   --   --   --   --  9.0  --   CREATININE 1.35* 1.48* 2.14*  --   --  2.70*  --  3.01*  --   LATICACIDVEN  --   --   --  >11.0* >11.0* >11.0* 9.8*  --  3.2*  VANCORANDOM  --   --   --   --   --   --   --  17  --     Estimated Creatinine Clearance: 21.2 mL/min (A) (by C-G formula based on SCr of 3.01 mg/dL (H)).    Allergies  Allergen Reactions  . Morphine And Related Other (See Comments)    hallucinations  . Tape Rash    Paper tape is ok   Antimicrobials this admission:  Vancoymcin 1/29 > Cefepime 1/29 >   Dose adjustments this admission:  1/30 VR = 17 (about 24 hrs after 1500 mg dose)  Microbiology results:  1/29  BCx: ngtd 1/28 MRSA PCR neg 1/28 COVID/Flu neg 1/29 TA - pending  Marguerite Olea, [place] Clinical Pharmacist Phone [phone]  12/11/2019 8:20 AM

## 2019-12-12 ENCOUNTER — Inpatient Hospital Stay (HOSPITAL_COMMUNITY): Payer: Medicare Other

## 2019-12-12 DIAGNOSIS — N170 Acute kidney failure with tubular necrosis: Secondary | ICD-10-CM

## 2019-12-12 LAB — COMPREHENSIVE METABOLIC PANEL
ALT: 1927 U/L — ABNORMAL HIGH (ref 0–44)
AST: 1047 U/L — ABNORMAL HIGH (ref 15–41)
Albumin: 2.2 g/dL — ABNORMAL LOW (ref 3.5–5.0)
Alkaline Phosphatase: 74 U/L (ref 38–126)
Anion gap: 16 — ABNORMAL HIGH (ref 5–15)
BUN: 98 mg/dL — ABNORMAL HIGH (ref 8–23)
CO2: 32 mmol/L (ref 22–32)
Calcium: 7.2 mg/dL — ABNORMAL LOW (ref 8.9–10.3)
Chloride: 94 mmol/L — ABNORMAL LOW (ref 98–111)
Creatinine, Ser: 3.61 mg/dL — ABNORMAL HIGH (ref 0.61–1.24)
GFR calc Af Amer: 18 mL/min — ABNORMAL LOW (ref >60)
GFR calc non Af Amer: 15 mL/min — ABNORMAL LOW (ref >60)
Glucose, Bld: 199 mg/dL — ABNORMAL HIGH (ref 70–99)
Potassium: 3.8 mmol/L (ref 3.5–5.1)
Sodium: 142 mmol/L (ref 135–145)
Total Bilirubin: 2.9 mg/dL — ABNORMAL HIGH (ref 0.3–1.2)
Total Protein: 4.9 g/dL — ABNORMAL LOW (ref 6.5–8.1)

## 2019-12-12 LAB — COOXEMETRY PANEL
Carboxyhemoglobin: 1.5 % (ref 0.5–1.5)
Methemoglobin: 1.2 % (ref 0.0–1.5)
O2 Saturation: 59.5 %
Total hemoglobin: 10.7 g/dL — ABNORMAL LOW (ref 12.0–16.0)

## 2019-12-12 LAB — GLUCOSE, CAPILLARY
Glucose-Capillary: 106 mg/dL — ABNORMAL HIGH (ref 70–99)
Glucose-Capillary: 147 mg/dL — ABNORMAL HIGH (ref 70–99)
Glucose-Capillary: 161 mg/dL — ABNORMAL HIGH (ref 70–99)
Glucose-Capillary: 161 mg/dL — ABNORMAL HIGH (ref 70–99)
Glucose-Capillary: 177 mg/dL — ABNORMAL HIGH (ref 70–99)
Glucose-Capillary: 184 mg/dL — ABNORMAL HIGH (ref 70–99)
Glucose-Capillary: 205 mg/dL — ABNORMAL HIGH (ref 70–99)

## 2019-12-12 LAB — CBC
HCT: 33 % — ABNORMAL LOW (ref 39.0–52.0)
Hemoglobin: 10.2 g/dL — ABNORMAL LOW (ref 13.0–17.0)
MCH: 27.6 pg (ref 26.0–34.0)
MCHC: 30.9 g/dL (ref 30.0–36.0)
MCV: 89.2 fL (ref 80.0–100.0)
Platelets: 59 10*3/uL — ABNORMAL LOW (ref 150–400)
RBC: 3.7 MIL/uL — ABNORMAL LOW (ref 4.22–5.81)
RDW: 19.3 % — ABNORMAL HIGH (ref 11.5–15.5)
WBC: 7.5 10*3/uL (ref 4.0–10.5)
nRBC: 0.5 % — ABNORMAL HIGH (ref 0.0–0.2)

## 2019-12-12 LAB — CULTURE, RESPIRATORY W GRAM STAIN

## 2019-12-12 LAB — VANCOMYCIN, RANDOM: Vancomycin Rm: 25

## 2019-12-12 MED ORDER — SODIUM CHLORIDE 0.9 % IV SOLN
1.0000 g | Freq: Two times a day (BID) | INTRAVENOUS | Status: DC
  Administered 2019-12-12 – 2019-12-14 (×5): 1 g via INTRAVENOUS
  Filled 2019-12-12 (×6): qty 1

## 2019-12-12 NOTE — Progress Notes (Signed)
NAME:  Jeremy Parrish, MRN:  [id number], DOB:  11/04/1941, LOS: 3 ADMISSION DATE:  12/09/2019, CONSULTATION DATE:  12/09/18 REFERRING MD:  Bronson Ing  CHIEF COMPLAINT:  Vent Management   Brief History   Jeremy Parrish is a 79 y/o male, with a PMH of aortic aneurysm, depression, bladder cancer, multiple myeloma, who presented from [facility] on 1/28 with A. Fib with RVR. On 1/29 he developed shock and was intubated and placed levophed and dobutamine. ICU was consulted for vent management.  Past Medical History   Past Medical History:  Diagnosis Date  . Anemia   . Aortic aneurysm of unspecified site without mention of rupture   . Arthritis   . Bladder neck contracture   . Cancer ([place])   . Cerebral atherosclerosis    Carotid Doppler, 02/16/2013 - Bilateral Proximal ICAs,demonstrate mild plaque w/o evidence of significant diameter reduction, dissection, or any other vascular abnormality  . CHF (congestive heart failure) ([place])   . Complication of anesthesia   . COPD (chronic obstructive pulmonary disease) ([place])   . Coronary artery disease   . Depression   . Esophageal reflux   . Heart disease   . Heart murmur   . Hx of bladder cancer 10/07/2012  . Hyperlipidemia   . Hypertension   . 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 Intestinovesical fistula   . Kidney stones    history  . Lung mass   . Multiple myeloma   . Myocardial infarction [facility])    '96  . Non Hodgkin's lymphoma ([place])   . Paroxysmal atrial fibrillation ([place]) 01/02/2016  . Peripheral arterial disease ([place])   . Personal history of other diseases of circulatory system   . PONV (postoperative nausea and vomiting)   . Prostate cancer ([place]) 2000  . Shingles   . Shortness of breath   . Sleep apnea    05-02-14 cpap , not yet used- suggested settings 5  . Stroke [facility]) 2013   Speech.     Significant Hospital Events   1/28 > Admission 1/29 > O/N Intubated 2/2 to  cardiac shock. Pressures stabilizing.   Consults:  PCCM.  Procedures:  ETT 1/29 >  R radial art line 1/29 >  R IJ CVC 1/29 >   Significant Diagnostic Tests:  Echo 1/28 > EF 30-35%, mild LVH, mildly reduced RV systolic function.  Severely dilated LA, mod dilated RA.  Micro Data:  SARS CoV2 1/28 > neg. Flu 1/28 > neg. Blood 129 >> Respiratory 1/29 >> Moraxella catarrhalis, beta-lactamase positive >>   Antimicrobials:  Cefepime 1/29 >> 1/31 vanco 1/29 >> 1/31 Meropenem 1/31 >>   Interim history/subjective:  Epinephrine drip weaned to off, norepinephrine at 2.  Remains on amiodarone Vasopressin remains at 0.04 Fentanyl 25, Versed 0.5 FiO2 0.40, PEEP 5 I/O+ 2.6 L total SVO2 59.5%   Objective:  Blood pressure 107/68, pulse 89, temperature 98.6 F (37 C), resp. rate 18, height 5' 7"  (1.702 m), weight 81.8 kg, SpO2 97 %. CVP:  [5 mmHg-7 mmHg] 5 mmHg  Vent Mode: PRVC FiO2 (%):  [40 %] 40 % Set Rate:  [12 bmp] 12 bmp Vt Set:  [520 mL] 520 mL PEEP:  [5 cmH20] 5 cmH20 Plateau Pressure:  [14 cmH20-17 cmH20] 15 cmH20   Intake/Output Summary (Last 24 hours) at 12/12/2019 0755 Last data filed at 12/12/2019 0700 Gross per 24 hour  Intake 2911.92 ml  Output 3375 ml  Net -463.08 ml  Filed Weights   12/10/19 0630 12/11/19 0500 12/12/19 0500  Weight: 82.8 kg 83.3 kg 81.8 kg    Examination: General: Intubated, comfortable respiratory pattern Neuro: Sedated, slight eye flickering grimace to pain but otherwise unresponsive HEENT: ET tube in good position, pupils are equal no oral lesions noted Cardiovascular: Regular, distant, no murmur Lungs: Clear bilaterally, decreased at both bases, no wheeze, no crackles Abdomen: Nondistended, positive bowel sounds Musculoskeletal: No deformities, no edema Skin: Somewhat cool, no rash  Chest x-ray from 1/31 reviewed by me, persistent bibasilar atelectasis, left pleural effusion  Assessment & Plan:   Acute Hypoxic Respiratory Failure  also due to shock, lactic acidosis:  Patient with newfound LVEF of 30-35% with RV dysfunction from 40-45% (11/2018) Continue current mechanical ventilation, PRVC 8 cc/kg As he improves hemodynamically approaching the point where we can start to try SBT's. Currently not awake enough Follow ABG, chest x-ray VAP prevention order set  Small left pleural effusion, could be parapneumonic versus transudate of due to fluid shifts Follow chest x-ray, too small to sample at this time  Multifactorial shock, cardiogenic and septic with associated Multi Organ Failure: Initial presentation consistent with cardiogenic shock, A. fib with RVR.  On therapy (including dobutamine) note SVR was low, cardiac output improved >> probable septic component.  Question hepatic source Continue wean norepinephrine as able.  I will stop vasopressin now Continue stress dose steroids Follow CVP.  Note SvO2 60%, may be some role for reinitiation inotropes going forward depending on hemodynamics and renal function, overall volume status Note respiratory cultures, Moraxella, beta-lactamase positive.  Change cefepime to meropenem.  Should be okay to stop vancomycin at this time 1/31  A. Fib with RVR: Patient with PMH of A Fib Continue IV amiodarone Anticoagulation currently on hold Ultimately will likely require right and left heart catheterization, repeat echocardiogram to assess LV function improvement (from ?  Septic component cardiomyopathy)  AG Metabolic Acidosis, due to lactic acidosis, acute renal failure.  Lactate clearing, now off sodium bicarbonate  Multiple Myeloma:  Lenalidomide held, on IVIG as an outpatient Following CBC Port less likely to be septic source now that we have identified positive respiratory culture  Acute renal failure due to shock, ATN, hypoperfusion state, increase SCr on 1/31.  Nonoliguric, 3650 cc last 24 hours Continue to follow urine output, BMP with restoration of adequate perfusion  pressures. Renally dose medications Patient would not want escalation of his care or initiation of CVVHD.  Support this decision  Hypomagnesemia, hypocalcemia, hypokalemia Continue to follow BMP, replace electrolytes as indicated  Transaminitis, improving Continue to follow LFT intermittently Follow coags  Thrombocytopenia.  Platelets have been up and down but significant drop over last 48 hours.  Suspect due to sepsis Okay to continue subcutaneous heparin for now.  Consider discontinuation heparin, DIC panel, HITT panel if drops further   Best Practice:  Diet: NPO. Pain/Anxiety/Delirium protocol (if indicated): fentanyl gtt / midazolam gtt.  RASS goal -1. VAP protocol (if indicated): In place. DVT prophylaxis: SCD's /heparin subcutaneous GI prophylaxis: PPI. Glucose control: Sliding-scale insulin moderate scale Mobility: Bedrest. Code Status: Limited.  No CPR, no escalation.  Cardioversion or defib for acute arrhythmia acceptable Family Communication: discussed status and plans with his wife by phone 1/31.  Disposition: ICU.  Labs   CBC: Recent Labs  Lab 12/09/19 0130 12/10/19 0058 12/10/19 1458 12/11/19 0419 12/11/19 0707 12/11/19 1935 12/12/19 0427  WBC 7.2  --   --  9.0  --  8.0 7.5  NEUTROABS 5.2  --   --   --   --   --   --  HGB 11.9*   < > 11.9* 10.7* 11.6* 10.7* 10.2*  HCT 39.4   < > 35.0* 34.1* 34.0* 33.7* 33.0*  MCV 91.4  --   --  87.7  --  88.7 89.2  PLT 133*  --   --  72*  --  61* 59*   < > = values in this interval not displayed.   Basic Metabolic Panel: Recent Labs  Lab 12/09/19 0930 12/09/19 0930 12/09/19 2150 12/10/19 0058 12/10/19 0457 12/10/19 0457 12/10/19 1453 12/10/19 1458 12/10/19 1733 12/11/19 0419 12/11/19 0707 12/11/19 1622 12/12/19 0427  NA 134*   < > 136   < > 140  --   --  138  --  137 140  --  142  K 4.2   < > 5.8*   < > 3.9   < >  --  3.4*  --  3.2* 3.2* 3.9 3.8  CL 101  --  101  --  97*  --   --   --   --  88*  --   --   94*  CO2 19*  --  10*  --  9*  --   --   --   --  33*  --   --  32  GLUCOSE 150*  --  115*  --  206*  --   --   --   --  204*  --   --  199*  BUN 38*  --  40*  --  44*  --   --   --   --  65*  --   --  98*  CREATININE 1.48*  --  2.14*  --  2.70*  --   --   --   --  3.01*  --   --  3.61*  CALCIUM 9.3  --  9.2  --  7.7*  --   --   --   --  6.7*  --   --  7.2*  MG 2.5*   < >  --   --  2.2  --  2.1  --  2.1 1.9  --  2.2  --   PHOS  --   --   --   --   --   --  4.7*  --  4.5 4.2  --  4.4  --    < > = values in this interval not displayed.   GFR: Estimated Creatinine Clearance: 17.5 mL/min (A) (by C-G formula based on SCr of 3.61 mg/dL (H)). Recent Labs  Lab 12/09/19 0130 12/09/19 2151 12/10/19 0457 12/10/19 1128 12/11/19 0419 12/11/19 0708 12/11/19 1023 12/11/19 1935 12/12/19 0427  WBC 7.2  --   --   --  9.0  --   --  8.0 7.5  LATICACIDVEN  --    < > >11.0* 9.8*  --  3.2* 3.1*  --   --    < > = values in this interval not displayed.   Liver Function Tests: Recent Labs  Lab 12/09/19 0130 12/09/19 2150 12/10/19 0457 12/11/19 0708 12/12/19 0427  AST 78* 796* 3,286* 3,995* 1,047*  ALT 70* 694* 2,140* 3,197* 1,927*  ALKPHOS 83 75 77 70 74  BILITOT 0.9 2.2* 2.5* 3.7* 2.9*  PROT 7.6 7.0 5.2* 5.4* 4.9*  ALBUMIN 3.8 3.3* 2.6* 2.5* 2.2*   No results for input(s): LIPASE, AMYLASE in the last 168 hours. No results for input(s): AMMONIA in the last 168 hours. ABG  Component Value Date/Time   PHART 7.628 (HH) 12/11/2019 0707   PCO2ART 34.5 12/11/2019 0707   PO2ART 68.0 (L) 12/11/2019 0707   HCO3 36.1 (H) 12/11/2019 0707   TCO2 37 (H) 12/11/2019 0707   ACIDBASEDEF 19.0 (H) 12/10/2019 0419   O2SAT 59.5 12/12/2019 0427    Coagulation Profile: Recent Labs  Lab 12/09/19 0930  INR 1.7*   Cardiac Enzymes: No results for input(s): CKTOTAL, CKMB, CKMBINDEX, TROPONINI in the last 168 hours. HbA1C: Hgb A1c MFr Bld  Date/Time Value Ref Range Status  12/10/2019 09:07 PM 5.6 4.8 -  5.6 % Final    Comment:    (NOTE) Pre diabetes:          5.7%-6.4% Diabetes:              >6.4% Glycemic control for   <7.0% adults with diabetes   01/08/2019 10:57 AM 5.6 4.8 - 5.6 % Final    Comment:    (NOTE) Pre diabetes:          5.7%-6.4% Diabetes:              >6.4% Glycemic control for   <7.0% adults with diabetes    CBG: Recent Labs  Lab 12/11/19 1536 12/11/19 1937 12/12/19 0012 12/12/19 0426 12/12/19 0748  GLUCAP 184* 168* 161* 184* 177*      Critical care time: 35 min.    Baltazar Apo, MD, PhD 12/12/2019, 7:55 AM [facility] [phone] or if no answer [phone]

## 2019-12-12 NOTE — Progress Notes (Signed)
Pharmacy Antibiotic Note  Jeremy Parrish is a 79 y.o. male admitted on 12/09/2019 with respiratory failure.  Pharmacy has been consulted initially for Vancomycin/Cefepime dosing for r/o sepsis. WBC ok but on chemo/IVIG, renal function worsening. Now afebrile.  Resp culture now growing abundant moraxella catarrhalis - beta lactamase positive.  Plan: Stop vancomycin/cefepime. Start meropenem 1g IV q 12 hrs. F/u renal function closely.   Height: 5\' 7"  (170.2 cm) Weight: 180 lb 5.4 oz (81.8 kg) IBW/kg (Calculated) : 66.1  Temp (24hrs), Avg:98.4 F (36.9 C), Min:97.5 F (36.4 C), Max:99.1 F (37.3 C)  Recent Labs  Lab 12/09/19 0130 12/09/19 0130 12/09/19 0930 12/09/19 2150 12/09/19 2151 12/10/19 0103 12/10/19 0457 12/10/19 1128 12/11/19 0419 12/11/19 0708 12/11/19 1023 12/11/19 1935 12/12/19 0427  WBC 7.2  --   --   --   --   --   --   --  9.0  --   --  8.0 7.5  CREATININE 1.35*   < > 1.48* 2.14*  --   --  2.70*  --  3.01*  --   --   --  3.61*  LATICACIDVEN  --   --   --   --    < > >11.0* >11.0* 9.8*  --  3.2* 3.1*  --   --   VANCORANDOM  --   --   --   --   --   --   --   --  17  --   --   --  25   < > = values in this interval not displayed.    Estimated Creatinine Clearance: 17.5 mL/min (A) (by C-G formula based on SCr of 3.61 mg/dL (H)).    Allergies  Allergen Reactions  . Morphine And Related Other (See Comments)    hallucinations  . Tape Rash    Paper tape is ok   Antimicrobials this admission:  Vancoymcin 1/29 > 1/31 Cefepime 1/29 > 1/31 Meropenem 1/31 >   Dose adjustments this admission:  1/30 VR = 17 (about 24 hrs after 1500 mg dose) 1/31 VR = 25 (about 17 hrs after 1250 mg dose)  Microbiology results:  1/29  BCx: ngtd 1/28 MRSA PCR neg 1/28 COVID/Flu neg 1/29 TA - abundant moraxella catarrhalis - beta lactamase +  Marguerite Olea, [facility] Clinical Pharmacist Phone [phone]  12/12/2019 8:44 AM

## 2019-12-13 ENCOUNTER — Ambulatory Visit: Payer: Medicare Other | Admitting: Medical

## 2019-12-13 LAB — COOXEMETRY PANEL
Carboxyhemoglobin: 1.5 % (ref 0.5–1.5)
Methemoglobin: 1 % (ref 0.0–1.5)
O2 Saturation: 59.8 %
Total hemoglobin: 10.8 g/dL — ABNORMAL LOW (ref 12.0–16.0)

## 2019-12-13 LAB — GLUCOSE, CAPILLARY
Glucose-Capillary: 147 mg/dL — ABNORMAL HIGH (ref 70–99)
Glucose-Capillary: 158 mg/dL — ABNORMAL HIGH (ref 70–99)
Glucose-Capillary: 159 mg/dL — ABNORMAL HIGH (ref 70–99)
Glucose-Capillary: 162 mg/dL — ABNORMAL HIGH (ref 70–99)
Glucose-Capillary: 186 mg/dL — ABNORMAL HIGH (ref 70–99)
Glucose-Capillary: 203 mg/dL — ABNORMAL HIGH (ref 70–99)

## 2019-12-13 LAB — COMPREHENSIVE METABOLIC PANEL
ALT: 1175 U/L — ABNORMAL HIGH (ref 0–44)
AST: 223 U/L — ABNORMAL HIGH (ref 15–41)
Albumin: 2.1 g/dL — ABNORMAL LOW (ref 3.5–5.0)
Alkaline Phosphatase: 87 U/L (ref 38–126)
Anion gap: 16 — ABNORMAL HIGH (ref 5–15)
BUN: 127 mg/dL — ABNORMAL HIGH (ref 8–23)
CO2: 30 mmol/L (ref 22–32)
Calcium: 8.2 mg/dL — ABNORMAL LOW (ref 8.9–10.3)
Chloride: 99 mmol/L (ref 98–111)
Creatinine, Ser: 3.77 mg/dL — ABNORMAL HIGH (ref 0.61–1.24)
GFR calc Af Amer: 17 mL/min — ABNORMAL LOW (ref >60)
GFR calc non Af Amer: 15 mL/min — ABNORMAL LOW (ref >60)
Glucose, Bld: 187 mg/dL — ABNORMAL HIGH (ref 70–99)
Potassium: 3.5 mmol/L (ref 3.5–5.1)
Sodium: 145 mmol/L (ref 135–145)
Total Bilirubin: 2 mg/dL — ABNORMAL HIGH (ref 0.3–1.2)
Total Protein: 4.8 g/dL — ABNORMAL LOW (ref 6.5–8.1)

## 2019-12-13 LAB — CBC
HCT: 33 % — ABNORMAL LOW (ref 39.0–52.0)
Hemoglobin: 10.4 g/dL — ABNORMAL LOW (ref 13.0–17.0)
MCH: 27.7 pg (ref 26.0–34.0)
MCHC: 31.5 g/dL (ref 30.0–36.0)
MCV: 88 fL (ref 80.0–100.0)
Platelets: 54 10*3/uL — ABNORMAL LOW (ref 150–400)
RBC: 3.75 MIL/uL — ABNORMAL LOW (ref 4.22–5.81)
RDW: 19.7 % — ABNORMAL HIGH (ref 11.5–15.5)
WBC: 7.8 10*3/uL (ref 4.0–10.5)
nRBC: 0.4 % — ABNORMAL HIGH (ref 0.0–0.2)

## 2019-12-13 MED ORDER — AMIODARONE HCL 200 MG PO TABS
200.0000 mg | ORAL_TABLET | Freq: Every day | ORAL | Status: DC
  Administered 2019-12-13 – 2019-12-15 (×3): 200 mg via ORAL
  Filled 2019-12-13 (×3): qty 1

## 2019-12-13 MED ORDER — POTASSIUM CHLORIDE 20 MEQ PO PACK
20.0000 meq | PACK | Freq: Once | ORAL | Status: AC
  Administered 2019-12-13: 12:00:00 20 meq via ORAL
  Filled 2019-12-13: qty 1

## 2019-12-13 NOTE — Progress Notes (Signed)
Chaplain engaged in initial visit with Jeremy Parrish and his wife Jeremy Parrish.  Chaplain and Jeremy Parrish prayed over [place].  Chaplain explained the support she offers.   Chaplain will continue to follow-up.

## 2019-12-13 NOTE — Progress Notes (Addendum)
Advanced Heart Failure Team Consult Note   Primary Physician: Celene Squibb, MD PCP-Cardiologist:  Sanda Klein, MD   Requesting: Dr. Jacinta Shoe   Reason for Consultation:  Shock (septic/cardiogenic)  HPI:    Remains intubated. FIO2 40% Over the weekend pressors weaned off. CO-OX 60%  Respiratory Cx- morcella catarrhhalis--> on meropenum   Creatinine trending up 3.0>3.6>33.7. Making > 2.5 liters urine.   On meropenum for PNA   Flow Track  SVR 831 CO 5.1 CI 2.2   Past Medical History: Past Medical History:  Diagnosis Date  . Anemia   . Aortic aneurysm of unspecified site without mention of rupture   . Arthritis   . Bladder neck contracture   . Cancer ([place])   . Cerebral atherosclerosis    Carotid Doppler, 02/16/2013 - Bilateral Proximal ICAs,demonstrate mild plaque w/o evidence of significant diameter reduction, dissection, or any other vascular abnormality  . CHF (congestive heart failure) ([place])   . Complication of anesthesia   . COPD (chronic obstructive pulmonary disease) ([place])   . Coronary artery disease   . Depression   . Esophageal reflux   . Heart disease   . Heart murmur   . Hx of bladder cancer 10/07/2012  . Hyperlipidemia   . Hypertension   . 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 Intestinovesical fistula   . Kidney stones    history  . Lung mass   . Multiple myeloma   . Myocardial infarction [facility])    '96  . Non Hodgkin's lymphoma ([place])   . Paroxysmal atrial fibrillation ([place]) 01/02/2016  . Peripheral arterial disease ([place])   . Personal history of other diseases of circulatory system   . PONV (postoperative nausea and vomiting)   . Prostate cancer ([place]) 2000  . Shingles   . Shortness of breath   . Sleep apnea    05-02-14 cpap , not yet used- suggested settings 5  . Stroke [facility]) 2013   Speech.    Past Surgical History: Past Surgical History:  Procedure Laterality Date    . BLADDER SURGERY    . BONE MARROW TRANSPLANT  2011  . CARDIOVERSION N/A 06/02/2019   Procedure: CARDIOVERSION;  Surgeon: Sanda Klein, MD;  Location: [facility] ENDOSCOPY;  Service: Cardiovascular;  Laterality: N/A;  . COLON SURGERY     colon resection  . COLONOSCOPY N/A 01/01/2013   Procedure: COLONOSCOPY;  Surgeon: Rogene Houston, MD;  Location: [facility] ENDO SUITE;  Service: Endoscopy;  Laterality: N/A;  825-moved to [place] notified pt  . COLONOSCOPY N/A 07/16/2018   Procedure: COLONOSCOPY;  Surgeon: Rogene Houston, MD;  Location: [facility] ENDO SUITE;  Service: Endoscopy;  Laterality: N/A;  1:25  . CORONARY ANGIOPLASTY  06/24/2000   PCI and stenting in mid & proximal RCA  . heart stents x 5  1999  . INGUINAL HERNIA REPAIR Right 05/04/2014   Procedure: OPEN RIGHT INGUINAL HERNIA REPAIR with mesh;  Surgeon: Edward Jolly, MD;  Location: [facility] ORS;  Service: General;  Laterality: Right;  . INSERT / REPLACE / REMOVE PACEMAKER    . left ear skin cancer removed    . NM MYOCAR PERF WALL MOTION  11/27/2007   inferior scar  . PACEMAKER INSERTION  07/22/2011   [employer]  . POLYPECTOMY  07/16/2018   Procedure: POLYPECTOMY;  Surgeon: Rogene Houston, MD;  Location: [facility];  Service: Endoscopy;;  colon  . PORTACATH PLACEMENT  07/26/2009  right chest  . PROSTATE SURGERY    . Rotator    . ROTATOR CUFF REPAIR Right   . SHOULDER ARTHROSCOPY WITH SUBACROMIAL DECOMPRESSION Right 07/21/2013   Procedure: RIGHT SHOULDER ARTHROSCOPY WITH SUBACROMIAL DECOMPRESSION AND DEBRIDEMENT & Injection of Left Shoulder;  Surgeon: Alta Corning, MD;  Location: [place];  Service: Orthopedics;  Laterality: Right;  . TEE WITHOUT CARDIOVERSION  10/13/2012   Procedure: TRANSESOPHAGEAL ECHOCARDIOGRAM (TEE);  Surgeon: Sanda Klein, MD;  Location: [facility] ENDOSCOPY;  Service: Cardiovascular;  Laterality: N/A;  pat/kay/echo notified  . US ECHOCARDIOGRAPHY  06/19/2011   RV mildly dilated,mild to mod. MR,mild AI,mild PI  . WRIST SURGERY      right    Family History: Family History  Problem Relation Age of Onset  . Cancer Father        bladder  . Heart disease Father        before age 95  . Hypertension Mother   . Cancer Brother   . Heart disease Brother        before age 56  . Heart disease Sister        before age 59  . Hyperlipidemia Sister   . Hypertension Sister   . Heart attack Sister   . Colon cancer Neg Hx   . Colon polyps Neg Hx     Social History: Social History   Socioeconomic History  . Marital status: Married    Spouse name: [name]  . Number of children: 3  . Years of education: 9th  . Highest education level: Not on file  Occupational History  . Occupation: retired   Tobacco Use  . Smoking status: Former Smoker    Packs/day: 1.00    Years: 20.00    Pack years: 20.00    Types: Cigarettes    Quit date: 11/14/1994    Years since quitting: 25.0  . Smokeless tobacco: Never Used  Substance and Sexual Activity  . Alcohol use: No    Alcohol/week: 0.0 standard drinks    Comment: previously drank but none for at least 15 years.  . Drug use: No  . Sexual activity: Not on file  Other Topics Concern  . Not on file  Social History Narrative   Patient lives at home spouse.   Caffeine Use: Occasionally   Social Determinants of Health   Financial Resource Strain:   . Difficulty of Paying Living Expenses: Not on file  Food Insecurity:   . Worried About Charity fundraiser in the Last Year: Not on file  . Ran Out of Food in the Last Year: Not on file  Transportation Needs:   . Lack of Transportation (Medical): Not on file  . Lack of Transportation (Non-Medical): Not on file  Physical Activity:   . Days of Exercise per Week: Not on file  . Minutes of Exercise per Session: Not on file  Stress:   . Feeling of Stress : Not on file  Social Connections:   . Frequency of Communication with Friends and Family: Not on file  . Frequency of Social Gatherings with Friends and Family: Not on file  .  Attends Religious Services: Not on file  . Active Member of Clubs or Organizations: Not on file  . Attends Archivist Meetings: Not on file  . Marital Status: Not on file    Allergies:  Allergies  Allergen Reactions  . Morphine And Related Other (See Comments)    hallucinations  . Tape Rash  Paper tape is ok    Objective:    Vital Signs:   Temp:  [97.9 F (36.6 C)-99.3 F (37.4 C)] 99.1 F (37.3 C) (02/01 0715) Pulse Rate:  [71-93] 79 (02/01 0730) Resp:  [15-50] 19 (02/01 0730) BP: (90-119)/(48-78) 117/56 (02/01 0730) SpO2:  [92 %-98 %] 94 % (02/01 0730) FiO2 (%):  [40 %] 40 % (02/01 0730) Weight:  [79.9 kg] 79.9 kg (02/01 0500) Last BM Date: (PTA)  Weight change: Filed Weights   12/11/19 0500 12/12/19 0500 12/13/19 0500  Weight: 83.3 kg 81.8 kg 79.9 kg    Intake/Output:   Intake/Output Summary (Last 24 hours) at 12/13/2019 0738 Last data filed at 12/13/2019 0700 Gross per 24 hour  Intake 2275.45 ml  Output 2885 ml  Net -609.55 ml      Physical Exam   CVP 5  General:  Intubated  HEENT: ETT Neck: supple. JVP 5-6 . Carotids 2+ bilat; no bruits. No lymphadenopathy or thryomegaly appreciated. Cor: PMI nondisplaced. Irregular rate & rhythm. No rubs, gallops or murmurs. Lungs: clear Abdomen: soft, nontender, nondistended. No hepatosplenomegaly. No bruits or masses. Good bowel sounds. Extremities: no cyanosis, clubbing, rash, R and LLE SCDs  edema Neuro:Intubated/sedated  Telemetry    AF V- pacing 70 with PVCs.    EKG    AF with RVR 121 Personally reviewed   Labs   Basic Metabolic Panel: Recent Labs  Lab 12/09/19 2150 12/10/19 0058 12/10/19 0457 12/10/19 0457 12/10/19 1453 12/10/19 1458 12/10/19 1733 12/11/19 0419 12/11/19 0707 12/11/19 1622 12/12/19 0427 12/13/19 0419  NA 136   < > 140   < >  --  138  --  137 140  --  142 145  K 5.8*   < > 3.9   < >  --  3.4*  --  3.2* 3.2* 3.9 3.8 3.5  CL 101  --  97*  --   --   --   --  88*   --   --  94* 99  CO2 10*  --  9*  --   --   --   --  33*  --   --  32 30  GLUCOSE 115*  --  206*  --   --   --   --  204*  --   --  199* 187*  BUN 40*  --  44*  --   --   --   --  65*  --   --  98* 127*  CREATININE 2.14*  --  2.70*  --   --   --   --  3.01*  --   --  3.61* 3.77*  CALCIUM 9.2   < > 7.7*   < >  --   --   --  6.7*  --   --  7.2* 8.2*  MG  --   --  2.2  --  2.1  --  2.1 1.9  --  2.2  --   --   PHOS  --   --   --   --  4.7*  --  4.5 4.2  --  4.4  --   --    < > = values in this interval not displayed.    Liver Function Tests: Recent Labs  Lab 12/09/19 2150 12/10/19 0457 12/11/19 0708 12/12/19 0427 12/13/19 0419  AST 796* 3,286* 3,995* 1,047* 223*  ALT 694* 2,140* 3,197* 1,927* 1,175*  ALKPHOS 75 77 70 74 87  BILITOT  2.2* 2.5* 3.7* 2.9* 2.0*  PROT 7.0 5.2* 5.4* 4.9* 4.8*  ALBUMIN 3.3* 2.6* 2.5* 2.2* 2.1*   No results for input(s): LIPASE, AMYLASE in the last 168 hours. No results for input(s): AMMONIA in the last 168 hours.  CBC: Recent Labs  Lab 12/09/19 0130 12/10/19 0058 12/11/19 0419 12/11/19 0707 12/11/19 1935 12/12/19 0427 12/13/19 0419  WBC 7.2  --  9.0  --  8.0 7.5 7.8  NEUTROABS 5.2  --   --   --   --   --   --   HGB 11.9*   < > 10.7* 11.6* 10.7* 10.2* 10.4*  HCT 39.4   < > 34.1* 34.0* 33.7* 33.0* 33.0*  MCV 91.4  --  87.7  --  88.7 89.2 88.0  PLT 133*  --  72*  --  61* 59* 54*   < > = values in this interval not displayed.    Cardiac Enzymes: No results for input(s): CKTOTAL, CKMB, CKMBINDEX, TROPONINI in the last 168 hours.  BNP: BNP (last 3 results) Recent Labs    12/09/19 0130  BNP 2,803.0*    ProBNP (last 3 results) No results for input(s): PROBNP in the last 8760 hours.   CBG: Recent Labs  Lab 12/12/19 1147 12/12/19 1534 12/12/19 1952 12/12/19 2328 12/13/19 0355  GLUCAP 106* 147* 205* 161* 147*    Coagulation Studies: No results for input(s): LABPROT, INR in the last 72 hours.   Imaging   No results  found.   Medications:     Current Medications: . atorvastatin  10 mg Oral q1800  . chlorhexidine gluconate (MEDLINE KIT)  15 mL Mouth Rinse BID  . Chlorhexidine Gluconate Cloth  6 each Topical Daily  . dextrose  50 mL Intravenous Once  . feeding supplement (PRO-STAT SUGAR FREE 64)  30 mL Per Tube BID  . heparin injection (subcutaneous)  5,000 Units Subcutaneous Q8H  . hydrocortisone sod succinate (SOLU-CORTEF) inj  50 mg Intravenous Q6H  . insulin aspart  0-15 Units Subcutaneous Q4H  . mouth rinse  15 mL Mouth Rinse 10 times per day  . pantoprazole (PROTONIX) IV  40 mg Intravenous Q12H  . sodium chloride flush  3 mL Intravenous Q12H    Infusions: . sodium chloride 10 mL/hr at 12/13/19 0700  . amiodarone 30 mg/hr (12/13/19 0700)  . DOBUTamine Stopped (12/10/19 0845)  . epinephrine Stopped (12/11/19 0748)  . feeding supplement (VITAL AF 1.2 CAL) 1,000 mL (12/12/19 1451)  . fentaNYL infusion INTRAVENOUS 25 mcg/hr (12/13/19 0700)  . meropenem (MERREM) IV Stopped (12/12/19 2203)  . midazolam 0.5 mg/hr (12/13/19 0700)  . norepinephrine (LEVOPHED) Adult infusion Stopped (12/13/19 0131)    Assessment/Plan   1. Cardiogenic shock/ Septic Shock - EF 30-35% by echo (newly down) - Unclear etiology ? AF vs ischemia.  Compounded by IV diltiazem  - Lactate > 11>9.8>3.1  - LFTs coming down.  - Respiratory Cx- moraxella - on meropenum - CO-OX 60% . Off pressors.   2. Acute systolic HF - management as above - CVP 5. No diuretics fo rnow.  - if recovers will need R/L cath  3. Acute hypoxic respiratory failure - intubated - CCM on board   4. CAD - s/p previous PCIs - hs trop 30 -> 43 -> 43. Not c/w ACS  5. AKI - Creatinine 1.3-> 2.1->3.77 - BUN trending up - Due to ATN from shock - Suspect will continue to get worse for a bit - Continue to make > 2  liters urine.   6. Chronic A fib  - IV amio for rate control and to so suppress PVCs.  - On subcutaneous heparin.  - Platelets  trending down. Check HIT panel.  - Interrogate MDT pacer in am   7. Multiple myeloma - treated at [facility]  8. Dementia - unclear how severe this is  9. Thrombocytopenia Platelets 133 on admit.  Platelets trending down to 54   Length of Stay: 4  Amy Clegg, NP  12/13/2019, 7:38 AM  Advanced Heart Failure Team Pager [phone] (M-F; 7a - 4p)  Please contact [facility] for night-coverage after hours (4p -7a ) and weekends on [email or web address]  Agree with above.  Remains on vent. Now off pressors. Co-ox 59%. Remains on amio gtt. Frequent PVCs. No further VT. Has chronic AF.   Creatinine continues to trend up but non-oliguirc. BUN 98 -> 127. LFTs coming down. Platelets 54K. No bleeding   General:  Intubated sedated HEENT: normal + ETT Neck: supple. RIJ TLC Carotids 2+ bilat; no bruits. No lymphadenopathy or thryomegaly appreciated. Cor: PMI nondisplaced. Irregular rate & rhythm. No rubs, gallops or murmurs. Lungs: clear Abdomen: soft, nontender, nondistended. No hepatosplenomegaly. No bruits or masses. Good bowel sounds. Extremities: no cyanosis, clubbing, rash, edema Neuro: intubated sedated   Suspect main issue was septic shock due to PNA with smaller contribution of cardiogenic shock. Now off pressors. Creatinine up but non-oliguric. Hopefully turns the corner today. Continue abx and amio. No need for inotropes now. Suspect thrombocytopenia from sepsis. Would continue DVT dose heparin for now. Hopefully we can wean vent soon.   CRITICAL CARE Performed by: Glori Bickers  Total critical care time: 35 minutes  Critical care time was exclusive of separately billable procedures and treating other patients.  Critical care was necessary to treat or prevent imminent or life-threatening deterioration.  Critical care was time spent personally by me (independent of midlevel providers or residents) on the following activities: development of treatment plan with patient and/or surrogate  as well as nursing, discussions with consultants, evaluation of patient's response to treatment, examination of patient, obtaining history from patient or surrogate, ordering and performing treatments and interventions, ordering and review of laboratory studies, ordering and review of radiographic studies, pulse oximetry and re-evaluation of patient's condition.  Glori Bickers, MD  8:12 AM

## 2019-12-13 NOTE — Progress Notes (Signed)
Late Entry:  Wasted 73mL of Versed and 26mL of Fentanyl with Lujean Rave, RN at 1000 after gtts D/C

## 2019-12-13 NOTE — Progress Notes (Addendum)
NAME:  Jeremy Parrish, MRN:  [id number], DOB:  [birth year], LOS: 4 ADMISSION DATE:  12/09/2019, CONSULTATION DATE:  12/09/18 REFERRING MD:  Bronson Ing  CHIEF COMPLAINT: Septic shock.  Brief History   Vasco Chong is a 79 y/o male, with a PMH of aortic aneurysm, depression, bladder cancer, multiple myeloma, who presented from [facility] on 1/28 with A. Fib with RVR. On 1/29 he developed shock and was intubated and placed levophed and dobutamine. ICU was consulted for vent management.  Past Medical History   Past Medical History:  Diagnosis Date  . Anemia   . Aortic aneurysm of unspecified site without mention of rupture   . Arthritis   . Bladder neck contracture   . Cancer ([place])   . Cerebral atherosclerosis    Carotid Doppler, 02/16/2013 - Bilateral Proximal ICAs,demonstrate mild plaque w/o evidence of significant diameter reduction, dissection, or any other vascular abnormality  . CHF (congestive heart failure) ([place])   . Complication of anesthesia   . COPD (chronic obstructive pulmonary disease) ([place])   . Coronary artery disease   . Depression   . Esophageal reflux   . Heart disease   . Heart murmur   . Hx of bladder cancer 10/07/2012  . Hyperlipidemia   . Hypertension   . 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 Intestinovesical fistula   . Kidney stones    history  . Lung mass   . Multiple myeloma   . Myocardial infarction [facility])    '96  . Non Hodgkin's lymphoma ([place])   . Paroxysmal atrial fibrillation ([place]) 01/02/2016  . Peripheral arterial disease ([place])   . Personal history of other diseases of circulatory system   . PONV (postoperative nausea and vomiting)   . Prostate cancer ([place]) 2000  . Shingles   . Shortness of breath   . Sleep apnea    05-02-14 cpap , not yet used- suggested settings 5  . Stroke [facility]) 2013   Speech.     Significant Hospital Events   1/28 > Admission 1/29 > O/N Intubated 2/2 to  cardiac shock. Pressures stabilizing.   Consults:  PCCM.  Procedures:  ETT 1/29 >  R radial art line 1/29 >  R IJ CVC 1/29 >   Significant Diagnostic Tests:  Echo 1/28 > EF 30-35%, mild LVH, mildly reduced RV systolic function.  Severely dilated LA, mod dilated RA.  Micro Data:  SARS CoV2 1/28 > neg. Flu 1/28 > neg. Blood 129 >> no growth at 2 days Respiratory 1/29 >> Moraxella catarrhalis, beta-lactamase positive >>   Antimicrobials:  Cefepime 1/29 >> 1/31 vanco 1/29 >> 1/31 Meropenem 1/31 >>   Interim history/subjective:   Successfully weaned off all vasopressors. Tolerated SBT yesterday but remains too sedated.  Objective:  Blood pressure 104/63, pulse 81, temperature 99 F (37.2 C), resp. rate 17, height '5\' 7"'$  (1.702 m), weight 79.9 kg, SpO2 95 %. CVP:  [4 mmHg-6 mmHg] 6 mmHg  Vent Mode: CPAP;PSV FiO2 (%):  [40 %] 40 % Set Rate:  [12 bmp] 12 bmp Vt Set:  [520 mL-5250 mL] 520 mL PEEP:  [5 cmH20] 5 cmH20 Pressure Support:  [10 cmH20] 10 cmH20 Plateau Pressure:  [13 cmH20-17 cmH20] 17 cmH20   Intake/Output Summary (Last 24 hours) at 12/13/2019 0951 Last data filed at 12/13/2019 0900 Gross per 24 hour  Intake 2245.29 ml  Output 2635 ml  Net -389.71 ml   Filed Weights   12/11/19  0500 12/12/19 0500 12/13/19 0500  Weight: 83.3 kg 81.8 kg 79.9 kg    Examination: General: Intubated, comfortable respiratory pattern Neuro: Sedated, slight eye flickering grimace to pain but otherwise unresponsive HEENT: ET tube in good position, pupils are equal no oral lesions noted Cardiovascular: Regular, distant, no murmur Lungs: Clear bilaterally, decreased at both bases, no wheeze, no crackles Abdomen: Nondistended, positive bowel sounds Musculoskeletal: No deformities, no edema Skin: Somewhat cool, no rash  Chest x-ray from 1/31 reviewed by me, persistent bibasilar atelectasis, left pleural effusion  Assessment & Plan:   Critically ill due to acute Hypoxic Respiratory  Failure also due to shock, lactic acidosis:  Patient with newfound LVEF of 30-35% with RV dysfunction from 40-45% (11/2018) Continue current mechanical ventilation, PRVC 8 cc/kg As he improves hemodynamically approaching the point where we can start to try SBT's. Currently not awake enough. - Stop sedation, allow to wake up and extubate.  Small left pleural effusion, could be parapneumonic versus transudate of due to fluid shifts Follow chest x-ray, too small to sample at this time  Multifactorial shock, cardiogenic and septic with associated Multi Organ Failure due to Moraxella pneumonia. Now off vasopressors.   A. Fib with RVR: Patient with PMH of A Fib Continue IV amiodarone Anticoagulation currently on hold Ultimately will likely require right and left heart catheterization, repeat echocardiogram to assess LV function improvement (from ?  Septic component cardiomyopathy)  AG Metabolic Acidosis, due to lactic acidosis, acute renal failure.  Lactate clearing, now off sodium bicarbonate  Multiple Myeloma:  Lenalidomide held, on IVIG as an outpatient Following CBC Port less likely to be septic source now that we have identified positive respiratory culture  Acute renal failure due to shock, ATN, hypoperfusion state, increase SCr on 1/31.  Nonoliguric, 3650 cc last 24 hours Continue to follow urine output, BMP with restoration of adequate perfusion pressures. Renally dose medications Patient would not want escalation of his care or initiation of CVVHD.  Support this decision  Hypomagnesemia, hypocalcemia, hypokalemia Continue to follow BMP, replace electrolytes as indicated  Transaminitis, improving Continue to follow LFT intermittently Follow coags  Thrombocytopenia.  Platelets have been up and down but significant drop over last 48 hours.  Suspect due to sepsis Okay to continue subcutaneous heparin for now.  Consider discontinuation heparin, DIC panel, HITT panel if drops  further   Best Practice:  Diet: NPO. Pain/Anxiety/Delirium protocol (if indicated): fentanyl gtt / midazolam gtt.  RASS goal -1. VAP protocol (if indicated): In place. DVT prophylaxis: SCD's /heparin subcutaneous GI prophylaxis: PPI. Glucose control: Sliding-scale insulin moderate scale Mobility: Bedrest. Code Status: Limited.  No CPR, no escalation.  Cardioversion or defib for acute arrhythmia acceptable Family Communication: discussed status and plans with his wife by phone 1/31.  Disposition: ICU.  Labs   CBC: Recent Labs  Lab 12/09/19 0130 12/10/19 0058 12/11/19 0419 12/11/19 0707 12/11/19 1935 12/12/19 0427 12/13/19 0419  WBC 7.2  --  9.0  --  8.0 7.5 7.8  NEUTROABS 5.2  --   --   --   --   --   --   HGB 11.9*   < > 10.7* 11.6* 10.7* 10.2* 10.4*  HCT 39.4   < > 34.1* 34.0* 33.7* 33.0* 33.0*  MCV 91.4  --  87.7  --  88.7 89.2 88.0  PLT 133*  --  72*  --  61* 59* 54*   < > = values in this interval not displayed.   Basic Metabolic Panel: Recent  Labs  Lab 12/09/19 2150 12/10/19 0058 12/10/19 0457 12/10/19 0457 12/10/19 1453 12/10/19 1458 12/10/19 1458 12/10/19 1733 12/11/19 0419 12/11/19 0707 12/11/19 1622 12/12/19 0427 12/13/19 0419  NA 136   < > 140   < >  --  138  --   --  137 140  --  142 145  K 5.8*   < > 3.9   < >  --  3.4*   < >  --  3.2* 3.2* 3.9 3.8 3.5  CL 101  --  97*  --   --   --   --   --  88*  --   --  94* 99  CO2 10*  --  9*  --   --   --   --   --  33*  --   --  32 30  GLUCOSE 115*  --  206*  --   --   --   --   --  204*  --   --  199* 187*  BUN 40*  --  44*  --   --   --   --   --  65*  --   --  98* 127*  CREATININE 2.14*  --  2.70*  --   --   --   --   --  3.01*  --   --  3.61* 3.77*  CALCIUM 9.2  --  7.7*  --   --   --   --   --  6.7*  --   --  7.2* 8.2*  MG  --   --  2.2  --  2.1  --   --  2.1 1.9  --  2.2  --   --   PHOS  --   --   --   --  4.7*  --   --  4.5 4.2  --  4.4  --   --    < > = values in this interval not displayed.    GFR: Estimated Creatinine Clearance: 16.6 mL/min (A) (by C-G formula based on SCr of 3.77 mg/dL (H)). Recent Labs  Lab 12/09/19 0130 12/10/19 0457 12/10/19 1128 12/11/19 0419 12/11/19 0708 12/11/19 1023 12/11/19 1935 12/12/19 0427 12/13/19 0419  WBC   < >  --   --  9.0  --   --  8.0 7.5 7.8  LATICACIDVEN  --  >11.0* 9.8*  --  3.2* 3.1*  --   --   --    < > = values in this interval not displayed.   Liver Function Tests: Recent Labs  Lab 12/09/19 2150 12/10/19 0457 12/11/19 0708 12/12/19 0427 12/13/19 0419  AST 796* 3,286* 3,995* 1,047* 223*  ALT 694* 2,140* 3,197* 1,927* 1,175*  ALKPHOS 75 77 70 74 87  BILITOT 2.2* 2.5* 3.7* 2.9* 2.0*  PROT 7.0 5.2* 5.4* 4.9* 4.8*  ALBUMIN 3.3* 2.6* 2.5* 2.2* 2.1*   No results for input(s): LIPASE, AMYLASE in the last 168 hours. No results for input(s): AMMONIA in the last 168 hours. ABG    Component Value Date/Time   PHART 7.628 (HH) 12/11/2019 0707   PCO2ART 34.5 12/11/2019 0707   PO2ART 68.0 (L) 12/11/2019 0707   HCO3 36.1 (H) 12/11/2019 0707   TCO2 37 (H) 12/11/2019 0707   ACIDBASEDEF 19.0 (H) 12/10/2019 0419   O2SAT 59.8 12/13/2019 0419    Coagulation Profile: Recent Labs  Lab 12/09/19 0930  INR 1.7*   Cardiac Enzymes: No  results for input(s): CKTOTAL, CKMB, CKMBINDEX, TROPONINI in the last 168 hours. HbA1C: Hgb A1c MFr Bld  Date/Time Value Ref Range Status  12/10/2019 09:07 PM 5.6 4.8 - 5.6 % Final    Comment:    (NOTE) Pre diabetes:          5.7%-6.4% Diabetes:              >6.4% Glycemic control for   <7.0% adults with diabetes   01/08/2019 10:57 AM 5.6 4.8 - 5.6 % Final    Comment:    (NOTE) Pre diabetes:          5.7%-6.4% Diabetes:              >6.4% Glycemic control for   <7.0% adults with diabetes    CBG: Recent Labs  Lab 12/12/19 1534 12/12/19 1952 12/12/19 2328 12/13/19 0355 12/13/19 0756  GLUCAP 147* 205* 161* 147* 159*    CRITICAL CARE Performed by: Kipp Brood   Total  critical care time: 40 minutes  Critical care time was exclusive of separately billable procedures and treating other patients.  Critical care was necessary to treat or prevent imminent or life-threatening deterioration.  Critical care was time spent personally by me on the following activities: development of treatment plan with patient and/or surrogate as well as nursing, discussions with consultants, evaluation of patient's response to treatment, examination of patient, obtaining history from patient or surrogate, ordering and performing treatments and interventions, ordering and review of laboratory studies, ordering and review of radiographic studies, pulse oximetry, re-evaluation of patient's condition and participation in multidisciplinary rounds.  Kipp Brood, MD [facility] ICU Physician [place]  Pager: [phone] Mobile: [phone] After hours: [phone].

## 2019-12-14 DIAGNOSIS — L8990 Pressure ulcer of unspecified site, unspecified stage: Secondary | ICD-10-CM | POA: Insufficient documentation

## 2019-12-14 LAB — BASIC METABOLIC PANEL
Anion gap: 14 (ref 5–15)
Anion gap: 17 — ABNORMAL HIGH (ref 5–15)
BUN: 129 mg/dL — ABNORMAL HIGH (ref 8–23)
BUN: 155 mg/dL — ABNORMAL HIGH (ref 8–23)
CO2: 25 mmol/L (ref 22–32)
CO2: 27 mmol/L (ref 22–32)
Calcium: 7.3 mg/dL — ABNORMAL LOW (ref 8.9–10.3)
Calcium: 8.9 mg/dL (ref 8.9–10.3)
Chloride: 110 mmol/L (ref 98–111)
Chloride: 108 mmol/L (ref 98–111)
Creatinine, Ser: 3.44 mg/dL — ABNORMAL HIGH (ref 0.61–1.24)
Creatinine, Ser: 3.99 mg/dL — ABNORMAL HIGH (ref 0.61–1.24)
GFR calc Af Amer: 19 mL/min — ABNORMAL LOW (ref >60)
GFR calc Af Amer: 16 mL/min — ABNORMAL LOW (ref >60)
GFR calc non Af Amer: 16 mL/min — ABNORMAL LOW (ref >60)
GFR calc non Af Amer: 14 mL/min — ABNORMAL LOW (ref >60)
Glucose, Bld: 191 mg/dL — ABNORMAL HIGH (ref 70–99)
Glucose, Bld: 223 mg/dL — ABNORMAL HIGH (ref 70–99)
Potassium: 2.7 mmol/L — CL (ref 3.5–5.1)
Potassium: 3.8 mmol/L (ref 3.5–5.1)
Sodium: 149 mmol/L — ABNORMAL HIGH (ref 135–145)
Sodium: 152 mmol/L — ABNORMAL HIGH (ref 135–145)

## 2019-12-14 LAB — GLUCOSE, CAPILLARY
Glucose-Capillary: 157 mg/dL — ABNORMAL HIGH (ref 70–99)
Glucose-Capillary: 167 mg/dL — ABNORMAL HIGH (ref 70–99)
Glucose-Capillary: 187 mg/dL — ABNORMAL HIGH (ref 70–99)
Glucose-Capillary: 188 mg/dL — ABNORMAL HIGH (ref 70–99)
Glucose-Capillary: 208 mg/dL — ABNORMAL HIGH (ref 70–99)
Glucose-Capillary: 87 mg/dL (ref 70–99)

## 2019-12-14 LAB — HEPARIN INDUCED PLATELET AB (HIT ANTIBODY): Heparin Induced Plt Ab: 0.085 OD (ref 0.000–0.400)

## 2019-12-14 LAB — CBC
HCT: 35.6 % — ABNORMAL LOW (ref 39.0–52.0)
Hemoglobin: 11.2 g/dL — ABNORMAL LOW (ref 13.0–17.0)
MCH: 27.3 pg (ref 26.0–34.0)
MCHC: 31.5 g/dL (ref 30.0–36.0)
MCV: 86.8 fL (ref 80.0–100.0)
Platelets: 51 10*3/uL — ABNORMAL LOW (ref 150–400)
RBC: 4.1 MIL/uL — ABNORMAL LOW (ref 4.22–5.81)
RDW: 20.3 % — ABNORMAL HIGH (ref 11.5–15.5)
WBC: 9.9 10*3/uL (ref 4.0–10.5)
nRBC: 0 % (ref 0.0–0.2)

## 2019-12-14 LAB — MAGNESIUM: Magnesium: 2.1 mg/dL (ref 1.7–2.4)

## 2019-12-14 MED ORDER — ACETAMINOPHEN 325 MG PO TABS
650.0000 mg | ORAL_TABLET | Freq: Four times a day (QID) | ORAL | Status: DC | PRN
  Administered 2019-12-14 – 2019-12-26 (×3): 650 mg
  Filled 2019-12-14 (×2): qty 2

## 2019-12-14 MED ORDER — ACETAMINOPHEN 650 MG RE SUPP: 650.0000 mg | Freq: Four times a day (QID) | RECTAL | Status: DC | PRN

## 2019-12-14 MED ORDER — CEFUROXIME SODIUM 1.5 G IV SOLR
1.5000 g | Freq: Two times a day (BID) | INTRAVENOUS | Status: DC
  Filled 2019-12-14: qty 1.5

## 2019-12-14 MED ORDER — POTASSIUM CHLORIDE 10 MEQ/50ML IV SOLN
10.0000 meq | INTRAVENOUS | Status: AC
  Administered 2019-12-14 (×4): 10 meq via INTRAVENOUS
  Filled 2019-12-14 (×4): qty 50

## 2019-12-14 MED ORDER — POTASSIUM CHLORIDE 20 MEQ PO PACK: 40.0000 meq | PACK | Freq: Once | ORAL | Status: DC

## 2019-12-14 MED ORDER — FREE WATER
200.0000 mL | Freq: Three times a day (TID) | Status: DC
  Administered 2019-12-14 – 2019-12-15 (×3): 200 mL

## 2019-12-14 MED ORDER — POTASSIUM CHLORIDE 20 MEQ/15ML (10%) PO SOLN
40.0000 meq | Freq: Once | ORAL | Status: AC
  Administered 2019-12-14: 14:00:00 40 meq
  Filled 2019-12-14: qty 30

## 2019-12-14 MED ORDER — SODIUM CHLORIDE 0.9 % IV SOLN
1.5000 g | Freq: Two times a day (BID) | INTRAVENOUS | Status: DC
  Administered 2019-12-14 – 2019-12-17 (×7): 1.5 g via INTRAVENOUS
  Filled 2019-12-14 (×9): qty 1.5

## 2019-12-14 MED ORDER — VITAL AF 1.2 CAL PO LIQD
1000.0000 mL | ORAL | Status: DC
  Administered 2019-12-14 – 2019-12-16 (×4): 1000 mL

## 2019-12-14 MED ORDER — FENTANYL CITRATE (PF) 100 MCG/2ML IJ SOLN
25.0000 ug | INTRAMUSCULAR | Status: DC | PRN
  Administered 2019-12-14 – 2019-12-15 (×4): 25 ug via INTRAVENOUS
  Filled 2019-12-14 (×4): qty 2

## 2019-12-14 NOTE — Progress Notes (Addendum)
Advanced Heart Failure Team Consult Note   Primary Physician: Celene Squibb, MD PCP-Cardiologist:  Sanda Klein, MD   Requesting: Dr. Jacinta Shoe   Reason for Consultation:  Shock (septic/cardiogenic)  HPI:    Remains intubated. FIO2 40%  Respiratory Cx- morcella catarrhhalis--> on meropenum   Creatinine trending up 3.0>3.6>3.7>3.4 . Making > 1.1 liters urine.   On meropenum for PNA   Past Medical History: Past Medical History:  Diagnosis Date  . Anemia   . Aortic aneurysm of unspecified site without mention of rupture   . Arthritis   . Bladder neck contracture   . Cancer ([place])   . Cerebral atherosclerosis    Carotid Doppler, 02/16/2013 - Bilateral Proximal ICAs,demonstrate mild plaque w/o evidence of significant diameter reduction, dissection, or any other vascular abnormality  . CHF (congestive heart failure) ([place])   . Complication of anesthesia   . COPD (chronic obstructive pulmonary disease) ([place])   . Coronary artery disease   . Depression   . Esophageal reflux   . Heart disease   . Heart murmur   . Hx of bladder cancer 10/07/2012  . Hyperlipidemia   . Hypertension   . 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 Intestinovesical fistula   . Kidney stones    history  . Lung mass   . Multiple myeloma   . Myocardial infarction [facility])    '96  . Non Hodgkin's lymphoma ([place])   . Paroxysmal atrial fibrillation ([place]) 01/02/2016  . Peripheral arterial disease ([place])   . Personal history of other diseases of circulatory system   . PONV (postoperative nausea and vomiting)   . Prostate cancer ([place]) 2000  . Shingles   . Shortness of breath   . Sleep apnea    05-02-14 cpap , not yet used- suggested settings 5  . Stroke [facility]) 2013   Speech.    Past Surgical History: Past Surgical History:  Procedure Laterality Date  . BLADDER SURGERY    . BONE MARROW TRANSPLANT  2011  . CARDIOVERSION N/A 06/02/2019    Procedure: CARDIOVERSION;  Surgeon: Sanda Klein, MD;  Location: [facility] ENDOSCOPY;  Service: Cardiovascular;  Laterality: N/A;  . COLON SURGERY     colon resection  . COLONOSCOPY N/A 01/01/2013   Procedure: COLONOSCOPY;  Surgeon: Rogene Houston, MD;  Location: [facility] ENDO SUITE;  Service: Endoscopy;  Laterality: N/A;  825-moved to [place] notified pt  . COLONOSCOPY N/A 07/16/2018   Procedure: COLONOSCOPY;  Surgeon: Rogene Houston, MD;  Location: [facility] ENDO SUITE;  Service: Endoscopy;  Laterality: N/A;  1:25  . CORONARY ANGIOPLASTY  06/24/2000   PCI and stenting in mid & proximal RCA  . heart stents x 5  1999  . INGUINAL HERNIA REPAIR Right 05/04/2014   Procedure: OPEN RIGHT INGUINAL HERNIA REPAIR with mesh;  Surgeon: Edward Jolly, MD;  Location: [facility] ORS;  Service: General;  Laterality: Right;  . INSERT / REPLACE / REMOVE PACEMAKER    . left ear skin cancer removed    . NM MYOCAR PERF WALL MOTION  11/27/2007   inferior scar  . PACEMAKER INSERTION  07/22/2011   [employer]  . POLYPECTOMY  07/16/2018   Procedure: POLYPECTOMY;  Surgeon: Rogene Houston, MD;  Location: [facility] END[facility];  Service: Endoscopy;;  colon  . PORTACATH PLACEMENT  07/26/2009   right chest  . PROSTATE SURGERY    . Rotator    . ROTATOR CUFF REPAIR  Right   . SHOULDER ARTHROSCOPY WITH SUBACROMIAL DECOMPRESSION Right 07/21/2013   Procedure: RIGHT SHOULDER ARTHROSCOPY WITH SUBACROMIAL DECOMPRESSION AND DEBRIDEMENT & Injection of Left Shoulder;  Surgeon: Alta Corning, MD;  Location: [address];  Service: Orthopedics;  Laterality: Right;  . TEE WITHOUT CARDIOVERSION  10/13/2012   Procedure: TRANSESOPHAGEAL ECHOCARDIOGRAM (TEE);  Surgeon: Sanda Klein, MD;  Location: [facility] ENDOSCOPY;  Service: Cardiovascular;  Laterality: N/A;  pat/kay/echo notified  . US ECHOCARDIOGRAPHY  06/19/2011   RV mildly dilated,mild to mod. MR,mild AI,mild PI  . WRIST SURGERY     right    Family History: Family History  Problem Relation Age of Onset  . Cancer  Father        bladder  . Heart disease Father        before age 68  . Hypertension Mother   . Cancer Brother   . Heart disease Brother        before age 57  . Heart disease Sister        before age 59  . Hyperlipidemia Sister   . Hypertension Sister   . Heart attack Sister   . Colon cancer Neg Hx   . Colon polyps Neg Hx     Social History: Social History   Socioeconomic History  . Marital status: Married    Spouse name: [name]  . Number of children: 3  . Years of education: 9th  . Highest education level: Not on file  Occupational History  . Occupation: retired   Tobacco Use  . Smoking status: Former Smoker    Packs/day: 1.00    Years: 20.00    Pack years: 20.00    Types: Cigarettes    Quit date: 11/14/1994    Years since quitting: 25.0  . Smokeless tobacco: Never Used  Substance and Sexual Activity  . Alcohol use: No    Alcohol/week: 0.0 standard drinks    Comment: previously drank but none for at least 15 years.  . Drug use: No  . Sexual activity: Not on file  Other Topics Concern  . Not on file  Social History Narrative   Patient lives at home spouse.   Caffeine Use: Occasionally   Social Determinants of Health   Financial Resource Strain:   . Difficulty of Paying Living Expenses: Not on file  Food Insecurity:   . Worried About Charity fundraiser in the Last Year: Not on file  . Ran Out of Food in the Last Year: Not on file  Transportation Needs:   . Lack of Transportation (Medical): Not on file  . Lack of Transportation (Non-Medical): Not on file  Physical Activity:   . Days of Exercise per Week: Not on file  . Minutes of Exercise per Session: Not on file  Stress:   . Feeling of Stress : Not on file  Social Connections:   . Frequency of Communication with Friends and Family: Not on file  . Frequency of Social Gatherings with Friends and Family: Not on file  . Attends Religious Services: Not on file  . Active Member of Clubs or Organizations: Not  on file  . Attends Archivist Meetings: Not on file  . Marital Status: Not on file    Allergies:  Allergies  Allergen Reactions  . Morphine And Related Other (See Comments)    hallucinations  . Tape Rash    Paper tape is ok    Objective:    Vital Signs:   Temp:  [97.3  F (36.3 C)-99.3 F (37.4 C)] 98.6 F (37 C) (02/02 0800) Pulse Rate:  [73-106] 98 (02/02 0800) Resp:  [15-31] 21 (02/02 0800) BP: (101-148)/(55-91) 148/91 (02/02 0800) SpO2:  [93 %-96 %] 95 % (02/02 0800) FiO2 (%):  [40 %] 40 % (02/02 0800) Weight:  [78.9 kg] 78.9 kg (02/02 0500) Last BM Date: (PTA)  Weight change: Filed Weights   12/12/19 0500 12/13/19 0500 12/14/19 0500  Weight: 81.8 kg 79.9 kg 78.9 kg    Intake/Output:   Intake/Output Summary (Last 24 hours) at 12/14/2019 0830 Last data filed at 12/14/2019 0800 Gross per 24 hour  Intake 1430.26 ml  Output 2510 ml  Net -1079.74 ml      Physical Exam   General:  Intubated HEENT: ETT Neck: supple. Difficult to assess. Carotids 2+ bilat; no bruits. No lymphadenopathy or thryomegaly appreciated. Cor: PMI nondisplaced. Irregular rate & rhythm. No rubs, gallops or murmurs. Lungs: clear Abdomen: soft, nontender, nondistended. No hepatosplenomegaly. No bruits or masses. Good bowel sounds. Extremities: no cyanosis, clubbing, rash, R and LLE edema Neuro: Intubated sedated.     Telemetry   A fib with PVCs V pacing 90-100s    EKG    AF with RVR 121 Personally reviewed   Labs   Basic Metabolic Panel: Recent Labs  Lab 12/10/19 0457 12/10/19 0457 12/10/19 1453 12/10/19 1458 12/10/19 1733 12/11/19 0419 12/11/19 0419 12/11/19 0707 12/11/19 1622 12/12/19 0427 12/13/19 0419 12/14/19 0405  NA 140   < >  --    < >  --  137  --  140  --  142 145 149*  K 3.9   < >  --    < >  --  3.2*   < > 3.2* 3.9 3.8 3.5 2.7*  CL 97*  --   --   --   --  88*  --   --   --  94* 99 110  CO2 9*  --   --   --   --  33*  --   --   --  32 30 25    GLUCOSE 206*  --   --   --   --  204*  --   --   --  199* 187* 191*  BUN 44*  --   --   --   --  65*  --   --   --  98* 127* 129*  CREATININE 2.70*  --   --   --   --  3.01*  --   --   --  3.61* 3.77* 3.44*  CALCIUM 7.7*   < >  --   --   --  6.7*   < >  --   --  7.2* 8.2* 7.3*  MG 2.2   < > 2.1  --  2.1 1.9  --   --  2.2  --   --  2.1  PHOS  --   --  4.7*  --  4.5 4.2  --   --  4.4  --   --   --    < > = values in this interval not displayed.    Liver Function Tests: Recent Labs  Lab 12/09/19 2150 12/10/19 0457 12/11/19 0708 12/12/19 0427 12/13/19 0419  AST 796* 3,286* 3,995* 1,047* 223*  ALT 694* 2,140* 3,197* 1,927* 1,175*  ALKPHOS 75 77 70 74 87  BILITOT 2.2* 2.5* 3.7* 2.9* 2.0*  PROT 7.0 5.2* 5.4* 4.9* 4.8*  ALBUMIN  3.3* 2.6* 2.5* 2.2* 2.1*   No results for input(s): LIPASE, AMYLASE in the last 168 hours. No results for input(s): AMMONIA in the last 168 hours.  CBC: Recent Labs  Lab 12/09/19 0130 12/10/19 0058 12/11/19 0419 12/11/19 0419 12/11/19 0707 12/11/19 1935 12/12/19 0427 12/13/19 0419 12/14/19 0405  WBC 7.2  --  9.0  --   --  8.0 7.5 7.8 9.9  NEUTROABS 5.2  --   --   --   --   --   --   --   --   HGB 11.9*   < > 10.7*   < > 11.6* 10.7* 10.2* 10.4* 11.2*  HCT 39.4   < > 34.1*   < > 34.0* 33.7* 33.0* 33.0* 35.6*  MCV 91.4  --  87.7  --   --  88.7 89.2 88.0 86.8  PLT 133*  --  72*  --   --  61* 59* 54* 51*   < > = values in this interval not displayed.    Cardiac Enzymes: No results for input(s): CKTOTAL, CKMB, CKMBINDEX, TROPONINI in the last 168 hours.  BNP: BNP (last 3 results) Recent Labs    12/09/19 0130  BNP 2,803.0*    ProBNP (last 3 results) No results for input(s): PROBNP in the last 8760 hours.   CBG: Recent Labs  Lab 12/13/19 1513 12/13/19 2008 12/13/19 2356 12/14/19 0420 12/14/19 0744  GLUCAP 162* 186* 203* 187* 87    Coagulation Studies: No results for input(s): LABPROT, INR in the last 72 hours.   Imaging   No  results found.   Medications:     Current Medications: . amiodarone  200 mg Oral Daily  . atorvastatin  10 mg Oral q1800  . chlorhexidine gluconate (MEDLINE KIT)  15 mL Mouth Rinse BID  . Chlorhexidine Gluconate Cloth  6 each Topical Daily  . dextrose  50 mL Intravenous Once  . feeding supplement (PRO-STAT SUGAR FREE 64)  30 mL Per Tube BID  . heparin injection (subcutaneous)  5,000 Units Subcutaneous Q8H  . hydrocortisone sod succinate (SOLU-CORTEF) inj  50 mg Intravenous Q6H  . insulin aspart  0-15 Units Subcutaneous Q4H  . mouth rinse  15 mL Mouth Rinse 10 times per day  . pantoprazole (PROTONIX) IV  40 mg Intravenous Q12H  . sodium chloride flush  3 mL Intravenous Q12H    Infusions: . sodium chloride Stopped (12/14/19 0754)  . feeding supplement (VITAL AF 1.2 CAL) 20 mL/hr at 12/14/19 0800  . meropenem (MERREM) IV 1 g (12/13/19 2209)  . potassium chloride 10 mEq (12/14/19 0754)    Assessment/Plan   1. Cardiogenic shock/ Septic Shock - EF 30-35% by echo (newly down) - Unclear etiology ? AF vs ischemia.  Compounded by IV diltiazem  - Lactate > 11>9.8>3.1  - LFTs coming down.  - Respiratory Cx- moraxella - on meropenum - Off pressors.  - CO-OX in am.   2. Acute systolic HF - management as above - ECHO 30-35%.  - Hold off on bb, arb, spiro for now. Creatinine 3.44. - No diuretics for now.  - if recovers will need R/L cath  3. Acute hypoxic respiratory failure -Remains intubated - CCM on board   4. CAD - s/p previous PCIs - hs trop 30 -> 43 -> 43. Not c/w ACS  5. AKI - Creatinine 1.3-> 2.1->3.77->3.44 - BUN trending up - Due to ATN from shock - Suspect will continue to get worse for a bit -  Continue to make > 2 liters urine.   6. Chronic A fib  - ON amio 200 mg daily and to suppress PVCs.  - On subcutaneous heparin.  - Platelets trending down.  - Interrogate MDT pacer in am   7. Multiple myeloma - treated at [facility]  8. Dementia - unclear how  severe this is  9. Thrombocytopenia Platelets 133 on admit.  Platelets trending down to 54>51   Length of Stay: 5  Amy Clegg, NP  12/14/2019, 8:30 AM  Advanced Heart Failure Team Pager [phone] (M-F; [place])  Please contact [facility] for night-coverage after hours (4p -7a ) and weekends on [email or web address]  Agree with above.   Remains intubated. Weaning from vent currently. Will awaken some. Now off pressors. Rhythms stable with chronic AF. Creatinine finally starting to improve. Co-ox not drawn today. Was 60% yesterday. Hypernatremia worse.   General:  On vent ill-appearing HEENT: normal + ETT Neck: supple. no JVD. RIJ TLC Carotids 2+ bilat; no bruits. No lymphadenopathy or thryomegaly appreciated. Cor: PMI nondisplaced. Irregular rate & rhythm. No rubs, gallops or murmurs. Lungs: clear Abdomen: soft, nontender, nondistended. No hepatosplenomegaly. No bruits or masses. Good bowel sounds. Extremities: no cyanosis, clubbing, rash, edema Neuro: will arouse on vent   Remains critically ill but now weaning from vent. Hemodynamics improved. Creatinine starting to improve. Continue abx for PNA. Hopefully can wean vent soon. Rhythm stable with chronic HF. D/w Dr. Bonne Dolores (his primery cardiologist) who said he has some aphasia at baseline but not severe dementia.   Given AKI will not plan cath in the near future unless clinical scenario changes.    Start free water.   Wife updated at bedside.   CRITICAL CARE Performed by: Glori Bickers  Total critical care time: 35 minutes  Critical care time was exclusive of separately billable procedures and treating other patients.  Critical care was necessary to treat or prevent imminent or life-threatening deterioration.  Critical care was time spent personally by me (independent of midlevel providers or residents) on the following activities: development of treatment plan with patient and/or surrogate as well as nursing, discussions  with consultants, evaluation of patient's response to treatment, examination of patient, obtaining history from patient or surrogate, ordering and performing treatments and interventions, ordering and review of laboratory studies, ordering and review of radiographic studies, pulse oximetry and re-evaluation of patient's condition.  Glori Bickers, MD  4:55 PM

## 2019-12-14 NOTE — Progress Notes (Signed)
[facility] Progress Note Patient Name: Jeremy Parrish DOB: [birth year] MRN: [id number]   Date of Service  12/14/2019  HPI/Events of Note  K+ = 2.7 and Creatinine = 3.44. Mg++ level not done.   eICU Interventions  Will order: 1. Replace K+. 2. Add Mg++ level to blood work in lab.      Intervention Category Major Interventions: Electrolyte abnormality - evaluation and management  Khrystina Bonnes [place] 12/14/2019, 5:28 AM

## 2019-12-14 NOTE — Progress Notes (Signed)
 NAME:  Jeremy Parrish, MRN:  [id number], DOB:  11/09/1941, LOS: 5 ADMISSION DATE:  12/09/2019, CONSULTATION DATE:  12/09/18 REFERRING MD:  Koneswaran  CHIEF COMPLAINT: Septic shock.  Brief History   Jeremy Parrish is a 78 y/o male, with a PMH of aortic aneurysm, depression, bladder cancer, multiple myeloma, who presented from [facility] on 1/28 with A. Fib with RVR. On 1/29 he developed shock and was intubated and placed levophed and dobutamine. He has been successfully weaned off vasopressors and sedation, but patient is still intubated due to sedation at this time.  Past Medical History   Past Medical History:  Diagnosis Date  . Anemia   . Aortic aneurysm of unspecified site without mention of rupture   . Arthritis   . Bladder neck contracture   . Cancer (HCC)   . Cerebral atherosclerosis    Carotid Doppler, 02/16/2013 - Bilateral Proximal ICAs,demonstrate mild plaque w/o evidence of significant diameter reduction, dissection, or any other vascular abnormality  . CHF (congestive heart failure) (HCC)   . Complication of anesthesia   . COPD (chronic obstructive pulmonary disease) (HCC)   . Coronary artery disease   . Depression   . Esophageal reflux   . Heart disease   . Heart murmur   . Hx of bladder cancer 10/07/2012  . Hyperlipidemia   . Hypertension   . 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 Intestinovesical fistula   . Kidney stones    history  . Lung mass   . Multiple myeloma   . Myocardial infarction (HCC)    '96  . Non Hodgkin's lymphoma (HCC)   . Paroxysmal atrial fibrillation (HCC) 01/02/2016  . Peripheral arterial disease (HCC)   . Personal history of other diseases of circulatory system   . PONV (postoperative nausea and vomiting)   . Prostate cancer (HCC) 2000  . Shingles   . Shortness of breath   . Sleep apnea    05-02-14 cpap , not yet used- suggested settings 5  . Stroke (HCC) 2013   Speech.      Significant Hospital Events   1/28 > Admission 1/29 > O/N Intubated 2/2 to cardiac shock. Pressures stabilizing.  2/1 > Successfully weaned off all vasopressors and sedation  Consults:  PCCM.  Procedures:  ETT 1/29 >  R radial art line 1/29 >  R IJ CVC 1/29 >   Significant Diagnostic Tests:  Echo 1/28 > EF 30-35%, mild LVH, mildly reduced RV systolic function.  Severely dilated LA, mod dilated RA.  Micro Data:  SARS CoV2 1/28 > neg. Flu 1/28 > neg. Blood 129 >> no growth at 2 days Respiratory 1/29 >> Moraxella catarrhalis, beta-lactamase positive >>   Antimicrobials:  Cefepime 1/29 >> 1/31 vanco 1/29 >> 1/31 Meropenem 1/31 >> 2/2 Cefuroxime 2/2 >  Interim history/subjective:  Remains too sedated to extubate.  Overnight increased ectopy and had one episode of sustained VT that lasted about 20s.  Labs showed hypokalemia, so KCl was ordered.  Increased ectopy remains, but there were no further continued or sustained runs of VT.  Objective:  Blood pressure (!) 148/91, pulse 98, temperature 98.6 F (37 C), resp. rate (!) 21, height 5' 7" (1.702 m), weight 78.9 kg, SpO2 95 %. CVP:  [5 mmHg] 5 mmHg  Vent Mode: PSV;CPAP FiO2 (%):  [40 %] 40 % Set Rate:  [12 bmp] 12 bmp Vt Set:  [520 mL] 520 mL PEEP:  [5 cmH20]   5 cmH20 Pressure Support:  [10 cmH20] 10 cmH20 Plateau Pressure:  [14 cmH20-17 cmH20] 16 cmH20   Intake/Output Summary (Last 24 hours) at 12/14/2019 0927 Last data filed at 12/14/2019 0800 Gross per 24 hour  Intake 1345.66 ml  Output 2510 ml  Net -1164.34 ml   Filed Weights   12/12/19 0500 12/13/19 0500 12/14/19 0500  Weight: 81.8 kg 79.9 kg 78.9 kg    Examination: General: Intubated, comfortable respiratory pattern Neuro: Sedated, responds to voice HEENT: ET tube in good position Cardiovascular: Irregular rate and rhythm Lungs: Minimal crackling at right lung base  Abdomen: Nondistended, soft Musculoskeletal: No deformities, no edema Skin: no  rashes  Assessment & Plan:   Hypoxic Respiratory Failure:  Patient with newfound LVEF of 30-35% with RV dysfunction from 40-45% (11/2018) Continue current mechanical ventilation, PRVC 8 cc/kg - Sedation stopped 2/1, patient still sedated, but passing SBTs > allow to wake up > extubate  Moraxella pneumonia -d/c meropenem -start cefuroxime  Symptomatic Hypokalemia: Pt had sustained VT for about 20s overnight and labs revealed hypokalemia (K+ of 2.7).  KCl was ordered (10mEq/hr over 4 hours). -KCl 40 meQ oral  -f/u BMP  A. Fib with RVR: Patient with PMH of A Fib -Continue IV amiodarone -Anticoagulation currently on hold -Ultimately will likely require right and left heart catheterization, repeat echocardiogram to assess LV function improvement (from ?  Septic component cardiomyopathy)  Multifactorial shock, cardiogenic and septic with associated Multi Organ Failure due to Moraxella pneumonia. -resolved  AG Metabolic Acidosis, due to lactic acidosis, acute renal failure.  -resolved  Multiple Myeloma:  Lenalidomide held, on IVIG as an outpatient Following CBC Port less likely to be septic source now that we have identified positive respiratory culture  Acute renal failure due to shock, ATN, hypoperfusion state.  Urine output of 2.5 L over last 24 hours.  Creatinine down-trending to 3.44 today (peak 3.77 on 2/1). -Continue to follow urine output, BMP -Renally dose medications -Patient would not want escalation of his care or initiation of CVVHD.  Support this decision  Transaminitis, improving -Continue to follow LFT intermittently -Follow coags  Thrombocytopenia.  Platelets decreased from 133 on admission (1/28) to 51 today.  Appears to be relatively stable.  Suspect due to sepsis and multiple myeloma. -f/u HIT panel results (still pending) -continue subcutaneous heparin for now   Best Practice:  Diet: NPO. Pain/Anxiety/Delirium protocol (if indicated): fentanyl gtt /  midazolam gtt.  RASS goal -1. VAP protocol (if indicated): In place. DVT prophylaxis: SCD's /heparin subcutaneous GI prophylaxis: PPI. Glucose control: Sliding-scale insulin moderate scale Mobility: Bedrest. Code Status: Limited.  No CPR, no escalation.  Cardioversion or defib for acute arrhythmia acceptable Family Communication: discussed status and plans with his wife by phone 1/31.  Disposition: ICU.  Labs   CBC: Recent Labs  Lab 12/09/19 0130 12/10/19 0058 12/11/19 0419 12/11/19 0419 12/11/19 0707 12/11/19 1935 12/12/19 0427 12/13/19 0419 12/14/19 0405  WBC 7.2  --  9.0  --   --  8.0 7.5 7.8 9.9  NEUTROABS 5.2  --   --   --   --   --   --   --   --   HGB 11.9*   < > 10.7*   < > 11.6* 10.7* 10.2* 10.4* 11.2*  HCT 39.4   < > 34.1*   < > 34.0* 33.7* 33.0* 33.0* 35.6*  MCV 91.4  --  87.7  --   --  88.7 89.2 88.0 86.8  PLT 133*  --    72*  --   --  61* 59* 54* 51*   < > = values in this interval not displayed.   Basic Metabolic Panel: Recent Labs  Lab 12/10/19 0457 12/10/19 0457 12/10/19 1453 12/10/19 1458 12/10/19 1733 12/11/19 0419 12/11/19 0419 12/11/19 0707 12/11/19 1622 12/12/19 0427 12/13/19 0419 12/14/19 0405  NA 140  --   --    < >  --  137  --  140  --  142 145 149*  K 3.9  --   --    < >  --  3.2*   < > 3.2* 3.9 3.8 3.5 2.7*  CL 97*  --   --   --   --  88*  --   --   --  94* 99 110  CO2 9*  --   --   --   --  33*  --   --   --  32 30 25  GLUCOSE 206*  --   --   --   --  204*  --   --   --  199* 187* 191*  BUN 44*  --   --   --   --  65*  --   --   --  98* 127* 129*  CREATININE 2.70*  --   --   --   --  3.01*  --   --   --  3.61* 3.77* 3.44*  CALCIUM 7.7*  --   --   --   --  6.7*  --   --   --  7.2* 8.2* 7.3*  MG 2.2   < > 2.1  --  2.1 1.9  --   --  2.2  --   --  2.1  PHOS  --   --  4.7*  --  4.5 4.2  --   --  4.4  --   --   --    < > = values in this interval not displayed.   GFR: Estimated Creatinine Clearance: 16.8 mL/min (A) (by C-G formula based  on SCr of 3.44 mg/dL (H)). Recent Labs  Lab 12/10/19 0457 12/10/19 1128 12/11/19 0419 12/11/19 0708 12/11/19 1023 12/11/19 1935 12/12/19 0427 12/13/19 0419 12/14/19 0405  WBC  --   --    < >  --   --  8.0 7.5 7.8 9.9  LATICACIDVEN >11.0* 9.8*  --  3.2* 3.1*  --   --   --   --    < > = values in this interval not displayed.   Liver Function Tests: Recent Labs  Lab 12/09/19 2150 12/10/19 0457 12/11/19 0708 12/12/19 0427 12/13/19 0419  AST 796* 3,286* 3,995* 1,047* 223*  ALT 694* 2,140* 3,197* 1,927* 1,175*  ALKPHOS 75 77 70 74 87  BILITOT 2.2* 2.5* 3.7* 2.9* 2.0*  PROT 7.0 5.2* 5.4* 4.9* 4.8*  ALBUMIN 3.3* 2.6* 2.5* 2.2* 2.1*   No results for input(s): LIPASE, AMYLASE in the last 168 hours. No results for input(s): AMMONIA in the last 168 hours. ABG    Component Value Date/Time   PHART 7.628 (HH) 12/11/2019 0707   PCO2ART 34.5 12/11/2019 0707   PO2ART 68.0 (L) 12/11/2019 0707   HCO3 36.1 (H) 12/11/2019 0707   TCO2 37 (H) 12/11/2019 0707   ACIDBASEDEF 19.0 (H) 12/10/2019 0419   O2SAT 59.8 12/13/2019 0419    Coagulation Profile: Recent Labs  Lab 12/09/19 0930  INR 1.7*   Cardiac Enzymes: No results for input(s):  CKTOTAL, CKMB, CKMBINDEX, TROPONINI in the last 168 hours. HbA1C: Hgb A1c MFr Bld  Date/Time Value Ref Range Status  12/10/2019 09:07 PM 5.6 4.8 - 5.6 % Final    Comment:    (NOTE) Pre diabetes:          5.7%-6.4% Diabetes:              >6.4% Glycemic control for   <7.0% adults with diabetes   01/08/2019 10:57 AM 5.6 4.8 - 5.6 % Final    Comment:    (NOTE) Pre diabetes:          5.7%-6.4% Diabetes:              >6.4% Glycemic control for   <7.0% adults with diabetes    CBG: Recent Labs  Lab 12/13/19 1513 12/13/19 2008 12/13/19 2356 12/14/19 0420 12/14/19 0744  GLUCAP 162* 186* 203* 187* 87    CRITICAL CARE Performed by:  ,  MS4   Total critical care time: 40 minutes  Critical care time was exclusive of  separately billable procedures and treating other patients.  Critical care was necessary to treat or prevent imminent or life-threatening deterioration.  Critical care was time spent personally by me on the following activities: development of treatment plan with patient and/or surrogate as well as nursing, discussions with consultants, evaluation of patient's response to treatment, examination of patient, obtaining history from patient or surrogate, ordering and performing treatments and interventions, ordering and review of laboratory studies, ordering and review of radiographic studies, pulse oximetry, re-evaluation of patient's condition and participation in multidisciplinary rounds.  

## 2019-12-14 NOTE — Plan of Care (Signed)
Jeremy Parrish is intubated. He responds to voice and will turn his head to make eye contact when requested. Currently he seems to weak to follow commands and use his extremities. UO wnl. Ectopy noted this shift, but a sustained run of VT occurred for about 20+seconds. [facility] contacted the RN while in another patient's room. When arriving to the bedside he had already broke out of the VT rhythm. Labs were obtained at that time per E-Link RN. Potassium lab result came back low and is now being replaced per order. Increased ectopy remains, but no continuous or sustained runs of VT have been noted since the event. Will continue to monitor.    Problem: Education: Goal: Knowledge of disease or condition will improve Outcome: Not Progressing Goal: Understanding of medication regimen will improve Outcome: Not Progressing   Problem: Activity: Goal: Ability to tolerate increased activity will improve Outcome: Not Progressing

## 2019-12-15 LAB — CULTURE, BLOOD (ROUTINE X 2)
Culture: NO GROWTH
Culture: NO GROWTH
Special Requests: ADEQUATE

## 2019-12-15 LAB — COOXEMETRY PANEL
Carboxyhemoglobin: 1 % (ref 0.5–1.5)
Methemoglobin: 0.6 % (ref 0.0–1.5)
O2 Saturation: 67.8 %
Total hemoglobin: 12.6 g/dL (ref 12.0–16.0)

## 2019-12-15 LAB — HEPATIC FUNCTION PANEL
ALT: 551 U/L — ABNORMAL HIGH (ref 0–44)
AST: 71 U/L — ABNORMAL HIGH (ref 15–41)
Albumin: 2.1 g/dL — ABNORMAL LOW (ref 3.5–5.0)
Alkaline Phosphatase: 115 U/L (ref 38–126)
Bilirubin, Direct: 0.6 mg/dL — ABNORMAL HIGH (ref 0.0–0.2)
Indirect Bilirubin: 0.9 mg/dL (ref 0.3–0.9)
Total Bilirubin: 1.5 mg/dL — ABNORMAL HIGH (ref 0.3–1.2)
Total Protein: 5.3 g/dL — ABNORMAL LOW (ref 6.5–8.1)

## 2019-12-15 LAB — GLUCOSE, CAPILLARY
Glucose-Capillary: 155 mg/dL — ABNORMAL HIGH (ref 70–99)
Glucose-Capillary: 161 mg/dL — ABNORMAL HIGH (ref 70–99)
Glucose-Capillary: 175 mg/dL — ABNORMAL HIGH (ref 70–99)
Glucose-Capillary: 188 mg/dL — ABNORMAL HIGH (ref 70–99)
Glucose-Capillary: 189 mg/dL — ABNORMAL HIGH (ref 70–99)
Glucose-Capillary: 204 mg/dL — ABNORMAL HIGH (ref 70–99)

## 2019-12-15 LAB — CBC
HCT: 39.3 % (ref 39.0–52.0)
Hemoglobin: 12.3 g/dL — ABNORMAL LOW (ref 13.0–17.0)
MCH: 27.5 pg (ref 26.0–34.0)
MCHC: 31.3 g/dL (ref 30.0–36.0)
MCV: 87.7 fL (ref 80.0–100.0)
Platelets: 50 10*3/uL — ABNORMAL LOW (ref 150–400)
RBC: 4.48 MIL/uL (ref 4.22–5.81)
RDW: 21.2 % — ABNORMAL HIGH (ref 11.5–15.5)
WBC: 10.2 10*3/uL (ref 4.0–10.5)
nRBC: 0.2 % (ref 0.0–0.2)

## 2019-12-15 LAB — BASIC METABOLIC PANEL
Anion gap: 16 — ABNORMAL HIGH (ref 5–15)
Anion gap: 13 (ref 5–15)
BUN: 165 mg/dL — ABNORMAL HIGH (ref 8–23)
BUN: 175 mg/dL — ABNORMAL HIGH (ref 8–23)
CO2: 27 mmol/L (ref 22–32)
CO2: 28 mmol/L (ref 22–32)
Calcium: 8.9 mg/dL (ref 8.9–10.3)
Calcium: 8.5 mg/dL — ABNORMAL LOW (ref 8.9–10.3)
Chloride: 111 mmol/L (ref 98–111)
Chloride: 114 mmol/L — ABNORMAL HIGH (ref 98–111)
Creatinine, Ser: 4.05 mg/dL — ABNORMAL HIGH (ref 0.61–1.24)
Creatinine, Ser: 3.82 mg/dL — ABNORMAL HIGH (ref 0.61–1.24)
GFR calc Af Amer: 15 mL/min — ABNORMAL LOW (ref >60)
GFR calc Af Amer: 17 mL/min — ABNORMAL LOW (ref >60)
GFR calc non Af Amer: 13 mL/min — ABNORMAL LOW (ref >60)
GFR calc non Af Amer: 14 mL/min — ABNORMAL LOW (ref >60)
Glucose, Bld: 211 mg/dL — ABNORMAL HIGH (ref 70–99)
Glucose, Bld: 198 mg/dL — ABNORMAL HIGH (ref 70–99)
Potassium: 3.6 mmol/L (ref 3.5–5.1)
Potassium: 3.2 mmol/L — ABNORMAL LOW (ref 3.5–5.1)
Sodium: 154 mmol/L — ABNORMAL HIGH (ref 135–145)
Sodium: 155 mmol/L — ABNORMAL HIGH (ref 135–145)

## 2019-12-15 LAB — MAGNESIUM: Magnesium: 2.7 mg/dL — ABNORMAL HIGH (ref 1.7–2.4)

## 2019-12-15 MED ORDER — DEXTROSE 5 % IV SOLN
INTRAVENOUS | Status: AC
  Administered 2019-12-15 (×2): 500 mL via INTRAVENOUS

## 2019-12-15 MED ORDER — FREE WATER
400.0000 mL | Freq: Three times a day (TID) | Status: DC
  Administered 2019-12-15 (×2): 400 mL

## 2019-12-15 MED ORDER — OXYCODONE HCL 5 MG PO TABS
5.0000 mg | ORAL_TABLET | Freq: Four times a day (QID) | ORAL | Status: DC | PRN
  Administered 2019-12-15 – 2019-12-19 (×5): 5 mg via ORAL
  Filled 2019-12-15 (×5): qty 1

## 2019-12-15 MED ORDER — PANTOPRAZOLE SODIUM 40 MG PO TBEC: 40.0000 mg | DELAYED_RELEASE_TABLET | Freq: Two times a day (BID) | ORAL | Status: DC

## 2019-12-15 MED ORDER — FREE WATER
400.0000 mL | Freq: Four times a day (QID) | Status: DC
  Administered 2019-12-15 – 2019-12-16 (×3): 400 mL

## 2019-12-15 MED ORDER — CARVEDILOL 6.25 MG PO TABS
6.2500 mg | ORAL_TABLET | Freq: Two times a day (BID) | ORAL | Status: DC
  Administered 2019-12-15 – 2019-12-16 (×2): 6.25 mg
  Filled 2019-12-15 (×2): qty 1

## 2019-12-15 MED ORDER — PANTOPRAZOLE SODIUM 40 MG IV SOLR
40.0000 mg | Freq: Two times a day (BID) | INTRAVENOUS | Status: DC
  Administered 2019-12-15 – 2019-12-19 (×9): 40 mg via INTRAVENOUS
  Filled 2019-12-15 (×9): qty 40

## 2019-12-15 NOTE — Progress Notes (Signed)
Dr. Lynetta Mare requested that lights and tv be on during the day to promote daytime wakefulness.

## 2019-12-15 NOTE — Progress Notes (Addendum)
Advanced Heart Failure Team Rounding Note   Primary Physician: Celene Squibb, MD PCP-Cardiologist:  Sanda Klein, MD   Requesting: Dr. Jacinta Shoe    HPI:    Remains intubated. FIO2 40%. Off sedation.   Respiratory Cx- morcella catarrhhalis--> on meropenum   Creatinine trending up 3.0>3.6>3.7>3.4 > 4.05 . Making > 2.7 liters of urine.   On meropenum for PNA     Past Medical History: Past Medical History:  Diagnosis Date  . Anemia   . Aortic aneurysm of unspecified site without mention of rupture   . Arthritis   . Bladder neck contracture   . Cancer ([place])   . Cerebral atherosclerosis    Carotid Doppler, 02/16/2013 - Bilateral Proximal ICAs,demonstrate mild plaque w/o evidence of significant diameter reduction, dissection, or any other vascular abnormality  . CHF (congestive heart failure) ([place])   . Complication of anesthesia   . COPD (chronic obstructive pulmonary disease) ([place])   . Coronary artery disease   . Depression   . Esophageal reflux   . Heart disease   . Heart murmur   . Hx of bladder cancer 10/07/2012  . Hyperlipidemia   . Hypertension   . 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 Intestinovesical fistula   . Kidney stones    history  . Lung mass   . Multiple myeloma   . Myocardial infarction [facility])    '96  . Non Hodgkin's lymphoma ([place])   . Paroxysmal atrial fibrillation ([place]) 01/02/2016  . Peripheral arterial disease ([place])   . Personal history of other diseases of circulatory system   . PONV (postoperative nausea and vomiting)   . Prostate cancer ([place]) 2000  . Shingles   . Shortness of breath   . Sleep apnea    05-02-14 cpap , not yet used- suggested settings 5  . Stroke [facility]) 2013   Speech.    Past Surgical History: Past Surgical History:  Procedure Laterality Date  . BLADDER SURGERY    . BONE MARROW TRANSPLANT  2011  . CARDIOVERSION N/A 06/02/2019   Procedure:  CARDIOVERSION;  Surgeon: Sanda Klein, MD;  Location: [facility] ENDOSCOPY;  Service: Cardiovascular;  Laterality: N/A;  . COLON SURGERY     colon resection  . COLONOSCOPY N/A 01/01/2013   Procedure: COLONOSCOPY;  Surgeon: Rogene Houston, MD;  Location: [facility] ENDO SUITE;  Service: Endoscopy;  Laterality: N/A;  825-moved to [place] notified pt  . COLONOSCOPY N/A 07/16/2018   Procedure: COLONOSCOPY;  Surgeon: Rogene Houston, MD;  Location: [facility] ENDO SUITE;  Service: Endoscopy;  Laterality: N/A;  1:25  . CORONARY ANGIOPLASTY  06/24/2000   PCI and stenting in mid & proximal RCA  . heart stents x 5  1999  . INGUINAL HERNIA REPAIR Right 05/04/2014   Procedure: OPEN RIGHT INGUINAL HERNIA REPAIR with mesh;  Surgeon: Edward Jolly, MD;  Location: [facility] ORS;  Service: General;  Laterality: Right;  . INSERT / REPLACE / REMOVE PACEMAKER    . left ear skin cancer removed    . NM MYOCAR PERF WALL MOTION  11/27/2007   inferior scar  . PACEMAKER INSERTION  07/22/2011   [employer]  . POLYPECTOMY  07/16/2018   Procedure: POLYPECTOMY;  Surgeon: Rogene Houston, MD;  Location: [facility] END[facility];  Service: Endoscopy;;  colon  . PORTACATH PLACEMENT  07/26/2009   right chest  . PROSTATE SURGERY    . Rotator    . ROTATOR  CUFF REPAIR Right   . SHOULDER ARTHROSCOPY WITH SUBACROMIAL DECOMPRESSION Right 07/21/2013   Procedure: RIGHT SHOULDER ARTHROSCOPY WITH SUBACROMIAL DECOMPRESSION AND DEBRIDEMENT & Injection of Left Shoulder;  Surgeon: Alta Corning, MD;  Location: [place];  Service: Orthopedics;  Laterality: Right;  . TEE WITHOUT CARDIOVERSION  10/13/2012   Procedure: TRANSESOPHAGEAL ECHOCARDIOGRAM (TEE);  Surgeon: Sanda Klein, MD;  Location: [facility] ENDOSCOPY;  Service: Cardiovascular;  Laterality: N/A;  pat/kay/echo notified  . US ECHOCARDIOGRAPHY  06/19/2011   RV mildly dilated,mild to mod. MR,mild AI,mild PI  . WRIST SURGERY     right    Family History: Family History  Problem Relation Age of Onset  . Cancer Father         bladder  . Heart disease Father        before age 46  . Hypertension Mother   . Cancer Brother   . Heart disease Brother        before age 21  . Heart disease Sister        before age 41  . Hyperlipidemia Sister   . Hypertension Sister   . Heart attack Sister   . Colon cancer Neg Hx   . Colon polyps Neg Hx     Social History: Social History   Socioeconomic History  . Marital status: Married    Spouse name: [name]  . Number of children: 3  . Years of education: 9th  . Highest education level: Not on file  Occupational History  . Occupation: retired   Tobacco Use  . Smoking status: Former Smoker    Packs/day: 1.00    Years: 20.00    Pack years: 20.00    Types: Cigarettes    Quit date: 11/14/1994    Years since quitting: 25.1  . Smokeless tobacco: Never Used  Substance and Sexual Activity  . Alcohol use: No    Alcohol/week: 0.0 standard drinks    Comment: previously drank but none for at least 15 years.  . Drug use: No  . Sexual activity: Not on file  Other Topics Concern  . Not on file  Social History Narrative   Patient lives at home spouse.   Caffeine Use: Occasionally   Social Determinants of Health   Financial Resource Strain:   . Difficulty of Paying Living Expenses: Not on file  Food Insecurity:   . Worried About Charity fundraiser in the Last Year: Not on file  . Ran Out of Food in the Last Year: Not on file  Transportation Needs:   . Lack of Transportation (Medical): Not on file  . Lack of Transportation (Non-Medical): Not on file  Physical Activity:   . Days of Exercise per Week: Not on file  . Minutes of Exercise per Session: Not on file  Stress:   . Feeling of Stress : Not on file  Social Connections:   . Frequency of Communication with Friends and Family: Not on file  . Frequency of Social Gatherings with Friends and Family: Not on file  . Attends Religious Services: Not on file  . Active Member of Clubs or Organizations: Not on file  .  Attends Archivist Meetings: Not on file  . Marital Status: Not on file    Allergies:  Allergies  Allergen Reactions  . Morphine And Related Other (See Comments)    hallucinations  . Tape Rash    Paper tape is ok    Objective:    Vital Signs:   Temp:  [  91.4 F (33 C)-99 F (37.2 C)] 99 F (37.2 C) (02/03 0600) Pulse Rate:  [82-121] 91 (02/03 0600) Resp:  [20-38] 34 (02/03 0600) BP: (105-161)/(59-100) 144/71 (02/03 0500) SpO2:  [90 %-99 %] 95 % (02/03 0600) FiO2 (%):  [40 %] 40 % (02/03 0439) Weight:  [77.7 kg] 77.7 kg (02/03 0500) Last BM Date: (PTA)  Weight change: Filed Weights   12/13/19 0500 12/14/19 0500 12/15/19 0500  Weight: 79.9 kg 78.9 kg 77.7 kg    Intake/Output:   Intake/Output Summary (Last 24 hours) at 12/15/2019 0655 Last data filed at 12/15/2019 0600 Gross per 24 hour  Intake 2078.75 ml  Output 2900 ml  Net -821.25 ml      Physical Exam  CVP 4 personally checked General:  Intubated.  HEENT: ETT Neck: supple. difficult to assess no JVD. Carotids 2+ bilat; no bruits. No lymphadenopathy or thryomegaly appreciated. Cor: PMI nondisplaced. Regular rate & rhythm. No rubs, gallops or murmurs. Lungs: clear Abdomen: soft, nontender, nondistended. No hepatosplenomegaly. No bruits or masses. Good bowel sounds. Extremities: no cyanosis, clubbing, rash, edema Neuro: Intubated   Telemetry   A fib V paced with PVCs. 110s    EKG  N/A   Labs   Basic Metabolic Panel: Recent Labs  Lab 12/10/19 0457 12/10/19 1453 12/10/19 1458 12/10/19 1733 12/11/19 0419 12/11/19 0419 12/11/19 0707 12/11/19 1622 12/12/19 0427 12/12/19 0427 12/13/19 0419 12/13/19 0419 12/14/19 0405 12/14/19 1256 12/15/19 0133  NA   < >  --    < >  --  137   < >   < >  --  142  --  145  --  149* 152* 154*  K   < >  --    < >  --  3.2*   < >   < > 3.9 3.8  --  3.5  --  2.7* 3.8 3.6  CL   < >  --   --   --  88*   < >  --   --  94*  --  99  --  110 108 111  CO2   < >   --   --   --  33*   < >  --   --  32  --  30  --  _0 GLUCOSE   < >  --   --   --  204*   < >  --   --  199*  --  187*  --  191* 223* 211*  BUN   < >  --   --   --  65*   < >  --   --  98*  --  127*  --  129* 155* 165*  CREATININE   < >  --   --   --  3.01*   < >  --   --  3.61*  --  3.77*  --  3.44* 3.99* 4.05*  CALCIUM   < >  --   --   --  6.7*   < >  --   --  7.2*   < > 8.2*   < > 7.3* 8.9 8.9  MG  --  2.1   < > 2.1 1.9  --   --  2.2  --   --   --   --  2.1  --  2.7*  PHOS  --  4.7*  --  4.5 4.2  --   --  4.4  --   --   --   --   --   --   --    < > = values in this interval not displayed.    Liver Function Tests: Recent Labs  Lab 12/10/19 0457 12/11/19 0708 12/12/19 0427 12/13/19 0419 12/15/19 0133  AST 3,286* 3,995* 1,047* 223* 71*  ALT 2,140* 3,197* 1,927* 1,175* 551*  ALKPHOS 77 70 74 87 115  BILITOT 2.5* 3.7* 2.9* 2.0* 1.5*  PROT 5.2* 5.4* 4.9* 4.8* 5.3*  ALBUMIN 2.6* 2.5* 2.2* 2.1* 2.1*   No results for input(s): LIPASE, AMYLASE in the last 168 hours. No results for input(s): AMMONIA in the last 168 hours.  CBC: Recent Labs  Lab 12/09/19 0130 12/10/19 0058 12/11/19 0419 12/11/19 0419 12/11/19 0707 12/11/19 1935 12/12/19 0427 12/13/19 0419 12/14/19 0405  WBC 7.2  --  9.0  --   --  8.0 7.5 7.8 9.9  NEUTROABS 5.2  --   --   --   --   --   --   --   --   HGB 11.9*   < > 10.7*   < > 11.6* 10.7* 10.2* 10.4* 11.2*  HCT 39.4   < > 34.1*   < > 34.0* 33.7* 33.0* 33.0* 35.6*  MCV 91.4  --  87.7  --   --  88.7 89.2 88.0 86.8  PLT 133*  --  72*  --   --  61* 59* 54* 51*   < > = values in this interval not displayed.    Cardiac Enzymes: No results for input(s): CKTOTAL, CKMB, CKMBINDEX, TROPONINI in the last 168 hours.  BNP: BNP (last 3 results) Recent Labs    12/09/19 0130  BNP 2,803.0*    ProBNP (last 3 results) No results for input(s): PROBNP in the last 8760 hours.   CBG: Recent Labs  Lab 12/14/19 1143 12/14/19 1641 12/14/19 1945  12/14/19 2338 12/15/19 0403  GLUCAP 208* 188* 167* 157* 188*    Coagulation Studies: No results for input(s): LABPROT, INR in the last 72 hours.   Imaging   No results found.   Medications:     Current Medications: . amiodarone  200 mg Oral Daily  . atorvastatin  10 mg Oral q1800  . chlorhexidine gluconate (MEDLINE KIT)  15 mL Mouth Rinse BID  . Chlorhexidine Gluconate Cloth  6 each Topical Daily  . dextrose  50 mL Intravenous Once  . feeding supplement (PRO-STAT SUGAR FREE 64)  30 mL Per Tube BID  . free water  200 mL Per Tube Q8H  . heparin injection (subcutaneous)  5,000 Units Subcutaneous Q8H  . insulin aspart  0-15 Units Subcutaneous Q4H  . mouth rinse  15 mL Mouth Rinse 10 times per day  . pantoprazole (PROTONIX) IV  40 mg Intravenous Q12H  . sodium chloride flush  3 mL Intravenous Q12H    Infusions: . sodium chloride 10 mL/hr at 12/15/19 0600  . cefUROXime (ZINACEF)  IV Stopped (12/14/19 2214)  . feeding supplement (VITAL AF 1.2 CAL) 55 mL/hr at 12/15/19 0300    Assessment/Plan   1. Cardiogenic shock/ Septic Shock - EF 30-35% by echo (newly down) - Unclear etiology ? AF vs ischemia.  Compounded by IV diltiazem  - Lactate > 11>9.8>3.1  - CBC pending.  - LFTs coming down.  - Blood CX 1/29 - No growth  - Respiratory Cx 1/29- moraxella - on meropenum - Off pressors.  - CO-OX 68%   2.  Acute systolic HF - management as above - ECHO 30-35%.  - Hold off on bb, arb, spiro for now. Creatinine continues to rise. - CVP low. No diuretics for now.  - if recovers will need R/L cath  3. Acute hypoxic respiratory failure -Remains intubated - CCM on board   4. CAD - s/p previous PCIs - hs trop 30 -> 43 -> 43. Not c/w ACS  5. AKI - Creatinine 1.3-> 2.1->3.77->3.44>4.05 - BUN continues to rise.  - Due to ATN from shock - Suspect will continue to get worse for a bit - Continue to make > 2 liters urine.   6. Chronic A fib  - ON amio 200 mg daily and to  suppress PVCs.  - On subcutaneous heparin.  - Platelets trending down.  - Interrogate MDT pacer in am   7. Multiple myeloma - treated at [facility]  8. Dementia - unclear how severe this is  9. Thrombocytopenia Platelets 133 on admit.  Platelets trending down to 54>51>pending   10. Hypernatremia Sodium trending up 149>152>154  - Receiving free water    Length of Stay: Gayle Mill, NP  12/15/2019, 6:55 AM  Advanced Heart Failure Team Pager [phone] (M-F; 7a - 4p)  Please contact [facility] for night-coverage after hours (4p -7a ) and weekends on [email or web address]  Agree with above  Remains intubated. Has been weaning on pressure support. Of pressors. Remains on meropenum for Moraxcella PNA and sepsis. Renal function improving slowly. Volume status ok. Co-ox 68%. Has chronic AF. Having frequent PVCs. (on po amio). Sodium trending up. Free water added yesterday. Creatinine now going back up   General:  On vent. Ill-appearing HEENT: normal Neck: supple. RIJ TLC. Carotids 2+ bilat; no bruits. No lymphadenopathy or thryomegaly appreciated. Cor: PMI nondisplaced. Irregular rate & rhythm. No rubs, gallops or murmurs. Lungs: coarse Abdomen: soft, nontender, nondistended. No hepatosplenomegaly. No bruits or masses. Good bowel sounds. Extremities: no cyanosis, clubbing, rash, edema Neuro: on vent arousable  Shock resolving. Suspect primarily sepsis with secondary component of cardiogenic shock. Hopefully can wean today but uremia may be limiting. Renal function getting worse again with BUN 165(!). Hypernatremia worsening as well. Will add some D5 to see if this will help  Although improving still has a long way to go and hopefully will have the reserve to recover in setting of advanced age and myeloma.   Will obviously defer cath for the near future given multiple issues. Follow PVCs. Keep K> 4.) Mg > 2.0. May need to consider mexilitene on top of amio.   CRITICAL CARE Performed by:  Glori Bickers  Total critical care time: 35 minutes  Critical care time was exclusive of separately billable procedures and treating other patients.  Critical care was necessary to treat or prevent imminent or life-threatening deterioration.  Critical care was time spent personally by me (independent of midlevel providers or residents) on the following activities: development of treatment plan with patient and/or surrogate as well as nursing, discussions with consultants, evaluation of patient's response to treatment, examination of patient, obtaining history from patient or surrogate, ordering and performing treatments and interventions, ordering and review of laboratory studies, ordering and review of radiographic studies, pulse oximetry and re-evaluation of patient's condition.  Glori Bickers, MD  8:09 AM

## 2019-12-15 NOTE — Progress Notes (Signed)
NAME:  Jeremy Parrish, MRN:  [id number], DOB:  06/21/1941, LOS: 6 ADMISSION DATE:  12/09/2019, CONSULTATION DATE:  12/09/18 REFERRING MD:  Bronson Ing  CHIEF COMPLAINT: Septic shock.  Brief History   Jeremy Parrish is a 79 y/o male, with a PMH of aortic aneurysm, depression, bladder cancer, multiple myeloma, who presented from [facility] on 1/28 with A. Fib with RVR. On 1/29 he developed shock and was intubated and placed levophed and dobutamine. He has been successfully weaned off vasopressors and sedation, but patient is still intubated due to sedation at this time.    Past Medical History   Past Medical History:  Diagnosis Date  . Anemia   . Aortic aneurysm of unspecified site without mention of rupture   . Arthritis   . Bladder neck contracture   . Cancer ([place])   . Cerebral atherosclerosis    Carotid Doppler, 02/16/2013 - Bilateral Proximal ICAs,demonstrate mild plaque w/o evidence of significant diameter reduction, dissection, or any other vascular abnormality  . CHF (congestive heart failure) ([place])   . Complication of anesthesia   . COPD (chronic obstructive pulmonary disease) ([place])   . Coronary artery disease   . Depression   . Esophageal reflux   . Heart disease   . Heart murmur   . Hx of bladder cancer 10/07/2012  . Hyperlipidemia   . Hypertension   . 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 Intestinovesical fistula   . Kidney stones    history  . Lung mass   . Multiple myeloma   . Myocardial infarction [facility])    '96  . Non Hodgkin's lymphoma ([place])   . Paroxysmal atrial fibrillation ([place]) 01/02/2016  . Peripheral arterial disease ([place])   . Personal history of other diseases of circulatory system   . PONV (postoperative nausea and vomiting)   . Prostate cancer ([place]) 2000  . Shingles   . Shortness of breath   . Sleep apnea    05-02-14 cpap , not yet used- suggested settings 5  . Stroke [facility]) 2013   Speech.      Significant Hospital Events   1/28 > Admission 1/29 > O/N Intubated 2/2 to cardiac shock. Pressures stabilizing.  2/1 > Successfully weaned off all vasopressors and sedation  Consults:  PCCM.  Procedures:  ETT 1/29 >  R radial art line 1/29 >  R IJ CVC 1/29 >   Significant Diagnostic Tests:  Echo 1/28 > EF 30-35%, mild LVH, mildly reduced RV systolic function.  Severely dilated LA, mod dilated RA.  Micro Data:  SARS CoV2 1/28 > neg. Flu 1/28 > neg. Blood 129 >> no growth at 2 days Respiratory 1/29 >> Moraxella catarrhalis, beta-lactamase positive >>   Antimicrobials:  Cefepime 1/29 >> 1/31 vanco 1/29 >> 1/31 Meropenem 1/31 >> 2/2 Cefuroxime 2/2 > 2/6 (full 7 day course)  Interim history/subjective:  Remains too sedated to extubate.  Overnight frequent PVCs.  Stat labs showed normal K+ and mildly elevated Mg.  BP got up to 170/119 at one point and did not improve with 19m of hydralizine.  Also became restless overnight and was given fentanyl.  Per nurse, patient was able to respond to commands for a short time last night.  Objective:  Blood pressure (!) 146/80, pulse (!) 112, temperature 99.1 F (37.3 C), resp. rate (!) 32, height 5' 7" (1.702 m), weight 77.7 kg, SpO2 95 %. CVP:  [2 mmHg-10 mmHg] 6 mmHg  Vent  Mode: PSV;CPAP FiO2 (%):  [40 %] 40 % Set Rate:  [12 bmp] 12 bmp Vt Set:  [520 mL] 520 mL PEEP:  [5 cmH20] 5 cmH20 Pressure Support:  [10 cmH20] 10 cmH20 Plateau Pressure:  [15 cmH20-19 cmH20] 15 cmH20   Intake/Output Summary (Last 24 hours) at 12/15/2019 1112 Last data filed at 12/15/2019 1000 Gross per 24 hour  Intake 1677.13 ml  Output 2915 ml  Net -1237.87 ml   Filed Weights   12/13/19 0500 12/14/19 0500 12/15/19 0500  Weight: 79.9 kg 78.9 kg 77.7 kg    Examination: General: Intubated, appears uncomfortable at times Neuro: Sedated, responds to voice intermittently HEENT: ET tube in good position, dry mucous membranes  Cardiovascular: RRR, no  murmurs, rubs appreciated Lungs: CTAB  Abdomen: Nondistended, soft Musculoskeletal: No deformities, no edema Skin: no rashes  Assessment & Plan:   Continued Sedation / Hypoactive delirium: Sedation stopped 2/1, patient still sedated, but passing SBTs.  Concern for sundowning as patient tends to get more agitated and restless at night. -delirium precautions - lights and TV on during the day, sit patient up in bed -d/c fentanyl -oxycodone 81m prn for severe pain -allow to wake up > extubate  Acute renal failure due to shock, ATN, hypoperfusion state.  Urine output remains good.  Creatinine 4.05 today (2/3), suspect that it is increasing 2/2 dehydration. -renal consult -D5 711mhr -free water 40071m8 hours -continue to follow urine output, BMP -renally dose medications -patient would not want escalation of his care or initiation of CVVHD.  Support this decision  HTN: Patient is having BPs in the 150s, with one episode up to 170/119. -add carvedilol 6.37m91moraxella pneumonia -cont cefuroxime  A. Fib with RVR: Patient with PMH of A Fib.  Found to have LVEF of 30-35%, with a RV dysfunction from 40-45% (11/2018) -Continue IV amiodarone -Anticoagulation currently on hold -Ultimately will likely require right and left heart catheterization, repeat echocardiogram to assess LV function improvement (from ?  Septic component cardiomyopathy)  Multiple Myeloma: Lenalidomide held, on IVIG as an outpatient -Following CBC  Transaminitis, improving -Continue to follow LFT intermittently -Follow coags  Thrombocytopenia.  Platelets decreased from 133 on admission (1/28) to 50 today.  Appears to be relatively stable.  Suspect due to sepsis and multiple myeloma.  HIT panel reassuring. -continue subcutaneous heparin for now  Multifactorial shock, cardiogenic and septic with associated Multi Organ Failure due to Moraxella pneumonia. -resolved  AG Metabolic Acidosis, due to lactic acidosis,  acute renal failure.  -resolved  Best Practice:  Diet: NPO. Pain/Anxiety/Delirium protocol (if indicated): fentanyl gtt / midazolam gtt.  RASS goal -1. VAP protocol (if indicated): In place. DVT prophylaxis: SCD's /heparin subcutaneous GI prophylaxis: PPI. Glucose control: Sliding-scale insulin moderate scale Mobility: Bedrest. Code Status: Limited.  No CPR, no escalation.  Cardioversion or defib for acute arrhythmia acceptable Family Communication: discussed status and plans with his wife by phone 1/31.  Disposition: ICU.  Labs   CBC: Recent Labs  Lab 12/09/19 0130 12/10/19 0058 12/11/19 1935 12/12/19 0427 12/13/19 0419 12/14/19 0405 12/15/19 0857  WBC 7.2   < > 8.0 7.5 7.8 9.9 10.2  NEUTROABS 5.2  --   --   --   --   --   --   HGB 11.9*   < > 10.7* 10.2* 10.4* 11.2* 12.3*  HCT 39.4   < > 33.7* 33.0* 33.0* 35.6* 39.3  MCV 91.4   < > 88.7 89.2 88.0 86.8 87.7  PLT 133*   < >  61* 59* 54* 51* 50*   < > = values in this interval not displayed.   Basic Metabolic Panel: Recent Labs  Lab 12/10/19 0457 12/10/19 1453 12/10/19 1458 12/10/19 1733 12/11/19 0419 12/11/19 0419 12/11/19 0707 12/11/19 1622 12/12/19 0427 12/13/19 0419 12/14/19 0405 12/14/19 1256 12/15/19 0133  NA   < >  --    < >  --  137   < >   < >  --  142 145 149* 152* 154*  K   < >  --    < >  --  3.2*   < >   < > 3.9 3.8 3.5 2.7* 3.8 3.6  CL   < >  --   --   --  88*   < >  --   --  94* 99 110 108 111  CO2   < >  --   --   --  33*   < >  --   --  32 _0 GLUCOSE   < >  --   --   --  204*   < >  --   --  199* 187* 191* 223* 211*  BUN   < >  --   --   --  65*   < >  --   --  98* 127* 129* 155* 165*  CREATININE   < >  --   --   --  3.01*   < >  --   --  3.61* 3.77* 3.44* 3.99* 4.05*  CALCIUM   < >  --   --   --  6.7*   < >  --   --  7.2* 8.2* 7.3* 8.9 8.9  MG  --  2.1   < > 2.1 1.9  --   --  2.2  --   --  2.1  --  2.7*  PHOS  --  4.7*  --  4.5 4.2  --   --  4.4  --   --   --   --   --    < > =  values in this interval not displayed.   GFR: Estimated Creatinine Clearance: 14.3 mL/min (A) (by C-G formula based on SCr of 4.05 mg/dL (H)). Recent Labs  Lab 12/10/19 0457 12/10/19 1128 12/11/19 0419 12/11/19 0708 12/11/19 1023 12/11/19 1935 12/12/19 0427 12/13/19 0419 12/14/19 0405 12/15/19 0857  WBC  --   --    < >  --   --    < > 7.5 7.8 9.9 10.2  LATICACIDVEN >11.0* 9.8*  --  3.2* 3.1*  --   --   --   --   --    < > = values in this interval not displayed.   Liver Function Tests: Recent Labs  Lab 12/10/19 0457 12/11/19 0708 12/12/19 0427 12/13/19 0419 12/15/19 0133  AST 3,286* 3,995* 1,047* 223* 71*  ALT 2,140* 3,197* 1,927* 1,175* 551*  ALKPHOS 77 70 74 87 115  BILITOT 2.5* 3.7* 2.9* 2.0* 1.5*  PROT 5.2* 5.4* 4.9* 4.8* 5.3*  ALBUMIN 2.6* 2.5* 2.2* 2.1* 2.1*   No results for input(s): LIPASE, AMYLASE in the last 168 hours. No results for input(s): AMMONIA in the last 168 hours. ABG    Component Value Date/Time   PHART 7.628 ([place]) 12/11/2019 0707   PCO2ART 34.5 12/11/2019 0707   PO2ART 68.0 (L) 12/11/2019 0707   HCO3 36.1 (H) 12/11/2019 2878  TCO2 37 (H) 12/11/2019 0707   ACIDBASEDEF 19.0 (H) 12/10/2019 0419   O2SAT 67.8 12/15/2019 0137    Coagulation Profile: Recent Labs  Lab 12/09/19 0930  INR 1.7*   Cardiac Enzymes: No results for input(s): CKTOTAL, CKMB, CKMBINDEX, TROPONINI in the last 168 hours. HbA1C: Hgb A1c MFr Bld  Date/Time Value Ref Range Status  12/10/2019 09:07 PM 5.6 4.8 - 5.6 % Final    Comment:    (NOTE) Pre diabetes:          5.7%-6.4% Diabetes:              >6.4% Glycemic control for   <7.0% adults with diabetes   01/08/2019 10:57 AM 5.6 4.8 - 5.6 % Final    Comment:    (NOTE) Pre diabetes:          5.7%-6.4% Diabetes:              >6.4% Glycemic control for   <7.0% adults with diabetes    CBG: Recent Labs  Lab 12/14/19 1641 12/14/19 1945 12/14/19 2338 12/15/19 0403 12/15/19 0828  GLUCAP 188* 167* 157* 188*  189*    CRITICAL CARE Performed by: Mallie Darting,  MS4   Total critical care time: 40 minutes  Critical care time was exclusive of separately billable procedures and treating other patients.  Critical care was necessary to treat or prevent imminent or life-threatening deterioration.  Critical care was time spent personally by me on the following activities: development of treatment plan with patient and/or surrogate as well as nursing, discussions with consultants, evaluation of patient's response to treatment, examination of patient, obtaining history from patient or surrogate, ordering and performing treatments and interventions, ordering and review of laboratory studies, ordering and review of radiographic studies, pulse oximetry, re-evaluation of patient's condition and participation in multidisciplinary rounds.

## 2019-12-15 NOTE — Progress Notes (Signed)
[facility] Progress Note Patient Name: Jeremy Parrish DOB: 22-Apr-1941 MRN: [id number]   Date of Service  12/15/2019  HPI/Events of Note  Frequent PVC's.  eICU Interventions  Will order: 1. BMP and Mg++ level STAT.     Intervention Category Major Interventions: Arrhythmia - evaluation and management  [name] 12/15/2019, 1:32 AM

## 2019-12-15 NOTE — Consult Note (Addendum)
[facility]  HISTORY AND PHYSICAL  Jeremy Parrish is an 79 y.o. male.    Chief Complaint: SOB  HPI: Pt is a 85M with a PMH sig for HTN, HLD, h/o lymphoma and MM, COPD, Afib, h/o prostate cancer, and CAD who is now seen in consultation at the request of Dr. Lynetta Mare for evaluation and recommendations surrounding AKI, azotemia, and hypernatremia.  Pt is intubated and history comes from chart and wife who is in the room.  Briefly, pt presented to the ED on 1/28 with 3 day history of worsening SOB.  Was placed on steroids for possible COPD exacerbation.  He was also noting LE swelling.  Ultimately was found to be in Afib with RVR and having NSVT.  He was initially diuresed; however on 1/29 he developed shock and was intubated, placed on pressors, and transferred to ICU.  Thought to be a mixed picture of cardiogenic and septic shock.  Respiratory culture from 1/29 with Moraxella catarralis. Was initially on vanc/ cefepime which has been changed to meropenem and then cefuroxime.  He is receiving Vital 1.2 TF.  Na is climbing to 154 and creatinine from 1.35 on admission--> 4.1 today with BUN up to 165.  Having adequate UOP.  On free water and D5W.    Minimal vent settings.  In this setting we are asked to see.    PMH: Past Medical History:  Diagnosis Date  . Anemia   . Aortic aneurysm of unspecified site without mention of rupture   . Arthritis   . Bladder neck contracture   . Cancer ([place])   . Cerebral atherosclerosis    Carotid Doppler, 02/16/2013 - Bilateral Proximal ICAs,demonstrate mild plaque w/o evidence of significant diameter reduction, dissection, or any other vascular abnormality  . CHF (congestive heart failure) ([place])   . Complication of anesthesia   . COPD (chronic obstructive pulmonary disease) ([place])   . Coronary artery disease   . Depression   . Esophageal reflux   . Heart disease   . Heart murmur   . Hx of bladder cancer 10/07/2012  . Hyperlipidemia   .  Hypertension   . 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 Intestinovesical fistula   . Kidney stones    history  . Lung mass   . Multiple myeloma   . Myocardial infarction [facility])    '96  . Non Hodgkin's lymphoma ([place])   . Paroxysmal atrial fibrillation ([place]) 01/02/2016  . Peripheral arterial disease ([place])   . Personal history of other diseases of circulatory system   . PONV (postoperative nausea and vomiting)   . Prostate cancer ([place]) 2000  . Shingles   . Shortness of breath   . Sleep apnea    05-02-14 cpap , not yet used- suggested settings 5  . Stroke [facility]) 2013   Speech.   PSH: Past Surgical History:  Procedure Laterality Date  . BLADDER SURGERY    . BONE MARROW TRANSPLANT  2011  . CARDIOVERSION N/A 06/02/2019   Procedure: CARDIOVERSION;  Surgeon: Sanda Klein, MD;  Location: [facility] ENDOSCOPY;  Service: Cardiovascular;  Laterality: N/A;  . COLON SURGERY     colon resection  . COLONOSCOPY N/A 01/01/2013   Procedure: COLONOSCOPY;  Surgeon: Rogene Houston, MD;  Location: [facility] ENDO SUITE;  Service: Endoscopy;  Laterality: N/A;  825-moved to [place] notified pt  . COLONOSCOPY N/A 07/16/2018   Procedure: COLONOSCOPY;  Surgeon: Rogene Houston, MD;  Location: [facility] ENDO SUITE;  Service: Endoscopy;  Laterality: N/A;  1:25  . CORONARY ANGIOPLASTY  06/24/2000   PCI and stenting in mid & proximal RCA  . heart stents x 5  1999  . INGUINAL HERNIA REPAIR Right 05/04/2014   Procedure: OPEN RIGHT INGUINAL HERNIA REPAIR with mesh;  Surgeon: Edward Jolly, MD;  Location: [facility] ORS;  Service: General;  Laterality: Right;  . INSERT / REPLACE / REMOVE PACEMAKER    . left ear skin cancer removed    . NM MYOCAR PERF WALL MOTION  11/27/2007   inferior scar  . PACEMAKER INSERTION  07/22/2011   [employer]  . POLYPECTOMY  07/16/2018   Procedure: POLYPECTOMY;  Surgeon: Rogene Houston, MD;  Location: [facility] ENDO SUITE;  Service: Endoscopy;;   colon  . PORTACATH PLACEMENT  07/26/2009   right chest  . PROSTATE SURGERY    . Rotator    . ROTATOR CUFF REPAIR Right   . SHOULDER ARTHROSCOPY WITH SUBACROMIAL DECOMPRESSION Right 07/21/2013   Procedure: RIGHT SHOULDER ARTHROSCOPY WITH SUBACROMIAL DECOMPRESSION AND DEBRIDEMENT & Injection of Left Shoulder;  Surgeon: Alta Corning, MD;  Location: [place];  Service: Orthopedics;  Laterality: Right;  . TEE WITHOUT CARDIOVERSION  10/13/2012   Procedure: TRANSESOPHAGEAL ECHOCARDIOGRAM (TEE);  Surgeon: Sanda Klein, MD;  Location: [facility] ENDOSCOPY;  Service: Cardiovascular;  Laterality: N/A;  pat/kay/echo notified  . US ECHOCARDIOGRAPHY  06/19/2011   RV mildly dilated,mild to mod. MR,mild AI,mild PI  . WRIST SURGERY     right    Past Medical History:  Diagnosis Date  . Anemia   . Aortic aneurysm of unspecified site without mention of rupture   . Arthritis   . Bladder neck contracture   . Cancer ([place])   . Cerebral atherosclerosis    Carotid Doppler, 02/16/2013 - Bilateral Proximal ICAs,demonstrate mild plaque w/o evidence of significant diameter reduction, dissection, or any other vascular abnormality  . CHF (congestive heart failure) ([place])   . Complication of anesthesia   . COPD (chronic obstructive pulmonary disease) ([place])   . Coronary artery disease   . Depression   . Esophageal reflux   . Heart disease   . Heart murmur   . Hx of bladder cancer 10/07/2012  . Hyperlipidemia   . Hypertension   . 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 Intestinovesical fistula   . Kidney stones    history  . Lung mass   . Multiple myeloma   . Myocardial infarction [facility])    '96  . Non Hodgkin's lymphoma ([place])   . Paroxysmal atrial fibrillation ([place]) 01/02/2016  . Peripheral arterial disease ([place])   . Personal history of other diseases of circulatory system   . PONV (postoperative nausea and vomiting)   . Prostate cancer ([place]) 2000  .  Shingles   . Shortness of breath   . Sleep apnea    05-02-14 cpap , not yet used- suggested settings 5  . Stroke [facility]) 2013   Speech.    Medications:   Scheduled: . amiodarone  200 mg Oral Daily  . atorvastatin  10 mg Oral q1800  . carvedilol  6.25 mg Per Tube BID WC  . chlorhexidine gluconate (MEDLINE KIT)  15 mL Mouth Rinse BID  . Chlorhexidine Gluconate Cloth  6 each Topical Daily  . dextrose  50 mL Intravenous Once  . feeding supplement (PRO-STAT SUGAR FREE 64)  30 mL Per Tube BID  . free  water  400 mL Per Tube Q8H  . heparin injection (subcutaneous)  5,000 Units Subcutaneous Q8H  . insulin aspart  0-15 Units Subcutaneous Q4H  . mouth rinse  15 mL Mouth Rinse 10 times per day  . pantoprazole (PROTONIX) IV  40 mg Intravenous Q12H  . sodium chloride flush  3 mL Intravenous Q12H    Facility-Administered Medications Prior to Admission  Medication Dose Route Frequency Provider Last Rate Last Admin  . sodium chloride flush (NS) 0.9 % injection 3 mL  3 mL Intravenous Q12H Croitoru, Mihai, MD       Medications Prior to Admission  Medication Sig Dispense Refill  . albuterol (PROVENTIL HFA;VENTOLIN HFA) 108 (90 Base) MCG/ACT inhaler Inhale 2 puffs into the lungs every 6 (six) hours as needed for wheezing or shortness of breath. 1 Inhaler 3  . albuterol (PROVENTIL) (2.5 MG/3ML) 0.083% nebulizer solution Take 2.5 mg by nebulization every 4 (four) hours as needed for wheezing or shortness of breath.     . ALPRAZolam (XANAX) 0.25 MG tablet Take 0.25 mg by mouth as needed.     Marland Kitchen amLODipine (NORVASC) 5 MG tablet Take 10 mg by mouth daily.     . clopidogrel (PLAVIX) 75 MG tablet TAKE 1 TABLET BY MOUTH EVERY DAY 90 tablet 2  . ELIQUIS 5 MG TABS tablet TAKE 1 TABLET BY MOUTH TWICE A DAY 180 tablet 2  . furosemide (LASIX) 20 MG tablet Use sparingly as needed for edema. No more than one tablet by mouth per week. 21 tablet 0  . HYDROcodone-acetaminophen (NORCO/VICODIN) 5-325 MG tablet Take 1  tablet by mouth 2 (two) times daily as needed. for pain  0  . ipratropium-albuterol (DUONEB) 0.5-2.5 (3) MG/3ML SOLN Take 3 mLs by nebulization every 6 (six) hours as needed (shortness of breath).     Marland Kitchen lenalidomide (REVLIMID) 10 MG capsule TAKE 1 CAPSULE BY MOUTH  DAILY FOR 7 DAYS ON THEN 7  DAYS OFF THEN REPEAT CYCLE 14 capsule 0  . levocetirizine (XYZAL) 5 MG tablet Take 5 mg by mouth every evening.     . methocarbamol (ROBAXIN) 750 MG tablet Take 750 mg by mouth every 8 (eight) hours as needed for muscle spasms.     . mometasone (NASONEX) 50 MCG/ACT nasal spray Place 2 sprays into the nose daily as needed (allergies).   1  . nitroGLYCERIN (NITROSTAT) 0.4 MG SL tablet PLACE 1 TABLET UNDER THE TONGUE EVERY 5 MINUTES AS NEEDED FOR CHEST PAIN. 25 tablet 4  . ondansetron (ZOFRAN) 4 MG tablet Take 4 mg by mouth every 8 (eight) hours as needed for nausea or vomiting.   1  . pantoprazole (PROTONIX) 40 MG tablet Take 40 mg by mouth daily.    . potassium chloride SA (KLOR-CON M20) 20 MEQ tablet Take 1 tablet (20 mEq total) by mouth 3 (three) times daily. 270 tablet 3  . simvastatin (ZOCOR) 20 MG tablet Take 1 tablet (20 mg total) by mouth daily at 6 PM. 90 tablet 1  . diltiazem (CARDIZEM CD) 120 MG 24 hr capsule Take 1 capsule (120 mg total) by mouth daily. (Patient not taking: Reported on 12/09/2019) 30 capsule 6  . loperamide (IMODIUM) 2 MG capsule TAKE 2 CAPSULES BY MOUTH 2 TIMES DAILY AS NEEDED FOR DIARRHEA OR LOOSE STOOLS. (Patient not taking: Reported on 10/28/2019) 90 capsule 0  . octreotide (SANDOSTATIN LAR DEPOT) 10 MG injection Inject 10 mg into the muscle every 28 (twenty-eight) days. 1 each 0  . omeprazole (  PRILOSEC) 20 MG capsule Take 20 mg by mouth daily.      ALLERGIES:   Allergies  Allergen Reactions  . Morphine And Related Other (See Comments)    hallucinations  . Tape Rash    Paper tape is ok    FAM HX: Family History  Problem Relation Age of Onset  . Cancer Father         bladder  . Heart disease Father        before age 21  . Hypertension Mother   . Cancer Brother   . Heart disease Brother        before age 79  . Heart disease Sister        before age 43  . Hyperlipidemia Sister   . Hypertension Sister   . Heart attack Sister   . Colon cancer Neg Hx   . Colon polyps Neg Hx     Social History:   reports that he quit smoking about 25 years ago. His smoking use included cigarettes. He has a 20.00 pack-year smoking history. He has never used smokeless tobacco. He reports that he does not drink alcohol or use drugs.  ROS: ROS: unobtainable d/t intubation  Blood pressure (!) 155/74, pulse (!) 115, temperature 99.3 F (37.4 C), resp. rate (!) 26, height '5\' 7"'$  (1.702 m), weight 77.7 kg, SpO2 96 %. PHYSICAL EXAM: Physical Exam  GEN: NAD, intubated, sedated HEENT ETT in place NECK no JVD PULM mechanical BS bilaterally CV tachycardic no m/r/g, loud S2 ABD  Mildly distended but soft and nontender EXT no LE edema NEURO intubated and sedated SKIN no rashes   Results for orders placed or performed during the hospital encounter of 12/09/19 (from the past 48 hour(s))  Glucose, capillary     Status: Abnormal   Collection Time: 12/13/19  8:08 PM  Result Value Ref Range   Glucose-Capillary 186 (H) 70 - 99 mg/dL  Glucose, capillary     Status: Abnormal   Collection Time: 12/13/19 11:56 PM  Result Value Ref Range   Glucose-Capillary 203 (H) 70 - 99 mg/dL  CBC     Status: Abnormal   Collection Time: 12/14/19  4:05 AM  Result Value Ref Range   WBC 9.9 4.0 - 10.5 K/uL   RBC 4.10 (L) 4.22 - 5.81 MIL/uL   Hemoglobin 11.2 (L) 13.0 - 17.0 g/dL   HCT 35.6 (L) 39.0 - 52.0 %   MCV 86.8 80.0 - 100.0 fL   MCH 27.3 26.0 - 34.0 pg   MCHC 31.5 30.0 - 36.0 g/dL   RDW 20.3 (H) 11.5 - 15.5 %   Platelets 51 (L) 150 - 400 K/uL    Comment: REPEATED TO VERIFY PLATELET COUNT CONFIRMED BY SMEAR Immature Platelet Fraction may be clinically indicated, consider ordering  this additional test [id number] CONSISTENT WITH PREVIOUS RESULT    nRBC 0.0 0.0 - 0.2 %    Comment: Performed at [facility], [address] [place],  [address]  Basic metabolic panel     Status: Abnormal   Collection Time: 12/14/19  4:05 AM  Result Value Ref Range   Sodium 149 (H) 135 - 145 mmol/L   Potassium 2.7 (LL) 3.5 - 5.1 mmol/L    Comment: CRITICAL RESULT CALLED TO, READ BACK BY AND VERIFIED WITH: CASON J,RN 12/14/19 0509 WAYK    Chloride 110 98 - 111 mmol/L   CO2 25 22 - 32 mmol/L   Glucose, Bld 191 (H) 70 - 99  mg/dL   BUN 129 (H) 8 - 23 mg/dL   Creatinine, Ser 3.44 (H) 0.61 - 1.24 mg/dL   Calcium 7.3 (L) 8.9 - 10.3 mg/dL   GFR calc non Af Amer 16 (L) >60 mL/min   GFR calc Af Amer 19 (L) >60 mL/min   Anion gap 14 5 - 15    Comment: Performed at [facility] [address] [place], [place] [address]  Magnesium     Status: None   Collection Time: 12/14/19  4:05 AM  Result Value Ref Range   Magnesium 2.1 1.7 - 2.4 mg/dL    Comment: Performed at [facility] [address], [place], [place] [address]  Glucose, capillary     Status: Abnormal   Collection Time: 12/14/19  4:20 AM  Result Value Ref Range   Glucose-Capillary 187 (H) 70 - 99 mg/dL   Comment 1 Document in Chart   Glucose, capillary     Status: None   Collection Time: 12/14/19  7:44 AM  Result Value Ref Range   Glucose-Capillary 87 70 - 99 mg/dL  Glucose, capillary     Status: Abnormal   Collection Time: 12/14/19 11:43 AM  Result Value Ref Range   Glucose-Capillary 208 (H) 70 - 99 mg/dL  Basic metabolic panel     Status: Abnormal   Collection Time: 12/14/19 12:56 PM  Result Value Ref Range   Sodium 152 (H) 135 - 145 mmol/L   Potassium 3.8 3.5 - 5.1 mmol/L   Chloride 108 98 - 111 mmol/L   CO2 27 22 - 32 mmol/L   Glucose, Bld 223 (H) 70 - 99 mg/dL   BUN 155 (H) 8 - 23 mg/dL   Creatinine, Ser 3.99 (H) 0.61 - 1.24 mg/dL   Calcium 8.9 8.9 - 10.3 mg/dL   GFR calc non Af Amer 14  (L) >60 mL/min   GFR calc Af Amer 16 (L) >60 mL/min   Anion gap 17 (H) 5 - 15    Comment: Performed at [facility], [place] [address] [place], [place] [address]  Glucose, capillary     Status: Abnormal   Collection Time: 12/14/19  4:41 PM  Result Value Ref Range   Glucose-Capillary 188 (H) 70 - 99 mg/dL  Glucose, capillary     Status: Abnormal   Collection Time: 12/14/19  7:45 PM  Result Value Ref Range   Glucose-Capillary 167 (H) 70 - 99 mg/dL  Glucose, capillary     Status: Abnormal   Collection Time: 12/14/19 11:38 PM  Result Value Ref Range   Glucose-Capillary 157 (H) 70 - 99 mg/dL  Hepatic function panel     Status: Abnormal   Collection Time: 12/15/19  1:33 AM  Result Value Ref Range   Total Protein 5.3 (L) 6.5 - 8.1 g/dL   Albumin 2.1 (L) 3.5 - 5.0 g/dL   AST 71 (H) 15 - 41 U/L   ALT 551 (H) 0 - 44 U/L   Alkaline Phosphatase 115 38 - 126 U/L   Total Bilirubin 1.5 (H) 0.3 - 1.2 mg/dL   Bilirubin, Direct 0.6 (H) 0.0 - 0.2 mg/dL   Indirect Bilirubin 0.9 0.3 - 0.9 mg/dL    Comment: Performed at [facility], [place] [address], [place], [place] [address]  Basic metabolic panel     Status: Abnormal   Collection Time: 12/15/19  1:33 AM  Result Value Ref Range   Sodium 154 (H) 135 - 145 mmol/L  Potassium 3.6 3.5 - 5.1 mmol/L   Chloride 111 98 - 111 mmol/L   CO2 27 22 - 32 mmol/L   Glucose, Bld 211 (H) 70 - 99 mg/dL   BUN 165 (H) 8 - 23 mg/dL   Creatinine, Ser 4.05 (H) 0.61 - 1.24 mg/dL   Calcium 8.9 8.9 - 10.3 mg/dL   GFR calc non Af Amer 13 (L) >60 mL/min   GFR calc Af Amer 15 (L) >60 mL/min   Anion gap 16 (H) 5 - 15    Comment: Performed at [facility] [address] [place], [place] [address]  Magnesium     Status: Abnormal   Collection Time: 12/15/19  1:33 AM  Result Value Ref Range   Magnesium 2.7 (H) 1.7 - 2.4 mg/dL    Comment: Performed at [facility] [address] [place], [place] [address]  Cooxemetry Panel (carboxy, met, total hgb,  O2 sat)     Status: None   Collection Time: 12/15/19  1:37 AM  Result Value Ref Range   Total hemoglobin 12.6 12.0 - 16.0 g/dL   O2 Saturation 67.8 %   Carboxyhemoglobin 1.0 0.5 - 1.5 %   Methemoglobin 0.6 0.0 - 1.5 %    Comment: Performed at [facility], [place] [address], [place], [place] [address]  Glucose, capillary     Status: Abnormal   Collection Time: 12/15/19  4:03 AM  Result Value Ref Range   Glucose-Capillary 188 (H) 70 - 99 mg/dL  Glucose, capillary     Status: Abnormal   Collection Time: 12/15/19  8:28 AM  Result Value Ref Range   Glucose-Capillary 189 (H) 70 - 99 mg/dL  CBC     Status: Abnormal   Collection Time: 12/15/19  8:57 AM  Result Value Ref Range   WBC 10.2 4.0 - 10.5 K/uL   RBC 4.48 4.22 - 5.81 MIL/uL   Hemoglobin 12.3 (L) 13.0 - 17.0 g/dL   HCT 39.3 39.0 - 52.0 %   MCV 87.7 80.0 - 100.0 fL   MCH 27.5 26.0 - 34.0 pg   MCHC 31.3 30.0 - 36.0 g/dL   RDW 21.2 (H) 11.5 - 15.5 %   Platelets 50 (L) 150 - 400 K/uL    Comment: REPEATED TO VERIFY Immature Platelet Fraction may be clinically indicated, consider ordering this additional test [id number] CONSISTENT WITH PREVIOUS RESULT    nRBC 0.2 0.0 - 0.2 %    Comment: Performed at [facility], [place] [address] [place], [place] [address]  Glucose, capillary     Status: Abnormal   Collection Time: 12/15/19 11:51 AM  Result Value Ref Range   Glucose-Capillary 204 (H) 70 - 99 mg/dL   *Note: Due to a large number of results and/or encounters for the requested time period, some results have not been displayed. A complete set of results can be found in Results Review.    No results found.  Assessment/Plan  1.  Acute kidney injury on CKD 3: baseline Cr 1.35 --> 4.1 today with multiple insults of shock, Afib, and multiple potentially nephrotoxic meds.  Will Send UA and UP/C.  He is making adequate urine which is reassuring.  I am hoping that if we add back some free water the hypernatremia and therefore AKI  will improve, but if the interventions made today don't help turn things around, he may need dialysis; I would favor CRRT given such a high BUN at this time.  Azotemia  likely a mixed picture from steroids, catabolic state, TF.  Discussed this with his wife.    2.  Hypernatremia:  It looks like he is making adequate urine; my suspicion is that he's catabolic from shock and has increased free water needs, combined with the osms of the tube feeds, he may be unable to excrete the sodium load needed to avoid hypernatremia.  FWF was increased to 400 q 8 today, will increase further to q 6 and will leave D5W the same for now.  PM labs pending.    3. Shock: mixed septic and cardiogenic picture, off pressors at this time.  AHF following  4.  Afib with RVR/ NSVT: on amio and coreg  5.  Moraxella pneumonia: on cefuroxime  6.  Dispo: remains in ICU   Madelon Lips 12/15/2019, 3:13 PM

## 2019-12-16 ENCOUNTER — Inpatient Hospital Stay (HOSPITAL_COMMUNITY): Payer: Medicare Other

## 2019-12-16 DIAGNOSIS — N179 Acute kidney failure, unspecified: Secondary | ICD-10-CM

## 2019-12-16 DIAGNOSIS — N189 Chronic kidney disease, unspecified: Secondary | ICD-10-CM

## 2019-12-16 DIAGNOSIS — Z452 Encounter for adjustment and management of vascular access device: Secondary | ICD-10-CM

## 2019-12-16 DIAGNOSIS — I5021 Acute systolic (congestive) heart failure: Secondary | ICD-10-CM

## 2019-12-16 LAB — RENAL FUNCTION PANEL
Albumin: 1.4 g/dL — ABNORMAL LOW (ref 3.5–5.0)
Albumin: 1.8 g/dL — ABNORMAL LOW (ref 3.5–5.0)
Albumin: 1.9 g/dL — ABNORMAL LOW (ref 3.5–5.0)
Anion gap: 12 (ref 5–15)
Anion gap: 13 (ref 5–15)
Anion gap: 13 (ref 5–15)
BUN: 144 mg/dL — ABNORMAL HIGH (ref 8–23)
BUN: 158 mg/dL — ABNORMAL HIGH (ref 8–23)
BUN: 140 mg/dL — ABNORMAL HIGH (ref 8–23)
CO2: 22 mmol/L (ref 22–32)
CO2: 27 mmol/L (ref 22–32)
CO2: 26 mmol/L (ref 22–32)
Calcium: 6.8 mg/dL — ABNORMAL LOW (ref 8.9–10.3)
Calcium: 7.7 mg/dL — ABNORMAL LOW (ref 8.9–10.3)
Calcium: 7.8 mg/dL — ABNORMAL LOW (ref 8.9–10.3)
Chloride: 119 mmol/L — ABNORMAL HIGH (ref 98–111)
Chloride: 113 mmol/L — ABNORMAL HIGH (ref 98–111)
Chloride: 112 mmol/L — ABNORMAL HIGH (ref 98–111)
Creatinine, Ser: 2.83 mg/dL — ABNORMAL HIGH (ref 0.61–1.24)
Creatinine, Ser: 3.08 mg/dL — ABNORMAL HIGH (ref 0.61–1.24)
Creatinine, Ser: 2.72 mg/dL — ABNORMAL HIGH (ref 0.61–1.24)
GFR calc Af Amer: 24 mL/min — ABNORMAL LOW (ref >60)
GFR calc Af Amer: 21 mL/min — ABNORMAL LOW (ref >60)
GFR calc Af Amer: 25 mL/min — ABNORMAL LOW (ref >60)
GFR calc non Af Amer: 21 mL/min — ABNORMAL LOW (ref >60)
GFR calc non Af Amer: 19 mL/min — ABNORMAL LOW (ref >60)
GFR calc non Af Amer: 22 mL/min — ABNORMAL LOW (ref >60)
Glucose, Bld: 162 mg/dL — ABNORMAL HIGH (ref 70–99)
Glucose, Bld: 195 mg/dL — ABNORMAL HIGH (ref 70–99)
Glucose, Bld: 177 mg/dL — ABNORMAL HIGH (ref 70–99)
Phosphorus: 4.3 mg/dL (ref 2.5–4.6)
Phosphorus: 4.9 mg/dL — ABNORMAL HIGH (ref 2.5–4.6)
Phosphorus: 4.5 mg/dL (ref 2.5–4.6)
Potassium: 2.9 mmol/L — ABNORMAL LOW (ref 3.5–5.1)
Potassium: 3.3 mmol/L — ABNORMAL LOW (ref 3.5–5.1)
Potassium: 3.5 mmol/L (ref 3.5–5.1)
Sodium: 153 mmol/L — ABNORMAL HIGH (ref 135–145)
Sodium: 153 mmol/L — ABNORMAL HIGH (ref 135–145)
Sodium: 151 mmol/L — ABNORMAL HIGH (ref 135–145)

## 2019-12-16 LAB — CBC
HCT: 37.4 % — ABNORMAL LOW (ref 39.0–52.0)
Hemoglobin: 11.6 g/dL — ABNORMAL LOW (ref 13.0–17.0)
MCH: 27.4 pg (ref 26.0–34.0)
MCHC: 31 g/dL (ref 30.0–36.0)
MCV: 88.4 fL (ref 80.0–100.0)
Platelets: 45 10*3/uL — ABNORMAL LOW (ref 150–400)
RBC: 4.23 MIL/uL (ref 4.22–5.81)
RDW: 21.5 % — ABNORMAL HIGH (ref 11.5–15.5)
WBC: 8 10*3/uL (ref 4.0–10.5)
nRBC: 0 % (ref 0.0–0.2)

## 2019-12-16 LAB — GLUCOSE, CAPILLARY
Glucose-Capillary: 118 mg/dL — ABNORMAL HIGH (ref 70–99)
Glucose-Capillary: 143 mg/dL — ABNORMAL HIGH (ref 70–99)
Glucose-Capillary: 151 mg/dL — ABNORMAL HIGH (ref 70–99)
Glucose-Capillary: 153 mg/dL — ABNORMAL HIGH (ref 70–99)
Glucose-Capillary: 162 mg/dL — ABNORMAL HIGH (ref 70–99)
Glucose-Capillary: 162 mg/dL — ABNORMAL HIGH (ref 70–99)
Glucose-Capillary: 166 mg/dL — ABNORMAL HIGH (ref 70–99)

## 2019-12-16 LAB — BASIC METABOLIC PANEL
Anion gap: 16 — ABNORMAL HIGH (ref 5–15)
Anion gap: 11 (ref 5–15)
BUN: 177 mg/dL — ABNORMAL HIGH (ref 8–23)
BUN: 173 mg/dL — ABNORMAL HIGH (ref 8–23)
CO2: 27 mmol/L (ref 22–32)
CO2: 29 mmol/L (ref 22–32)
Calcium: 8.1 mg/dL — ABNORMAL LOW (ref 8.9–10.3)
Calcium: 8.2 mg/dL — ABNORMAL LOW (ref 8.9–10.3)
Chloride: 112 mmol/L — ABNORMAL HIGH (ref 98–111)
Chloride: 117 mmol/L — ABNORMAL HIGH (ref 98–111)
Creatinine, Ser: 3.7 mg/dL — ABNORMAL HIGH (ref 0.61–1.24)
Creatinine, Ser: 3.7 mg/dL — ABNORMAL HIGH (ref 0.61–1.24)
GFR calc Af Amer: 17 mL/min — ABNORMAL LOW (ref >60)
GFR calc Af Amer: 17 mL/min — ABNORMAL LOW (ref >60)
GFR calc non Af Amer: 15 mL/min — ABNORMAL LOW (ref >60)
GFR calc non Af Amer: 15 mL/min — ABNORMAL LOW (ref >60)
Glucose, Bld: 175 mg/dL — ABNORMAL HIGH (ref 70–99)
Glucose, Bld: 186 mg/dL — ABNORMAL HIGH (ref 70–99)
Potassium: 3.1 mmol/L — ABNORMAL LOW (ref 3.5–5.1)
Potassium: 4.4 mmol/L (ref 3.5–5.1)
Sodium: 155 mmol/L — ABNORMAL HIGH (ref 135–145)
Sodium: 157 mmol/L — ABNORMAL HIGH (ref 135–145)

## 2019-12-16 LAB — COOXEMETRY PANEL
Carboxyhemoglobin: 1.4 % (ref 0.5–1.5)
Methemoglobin: 1 % (ref 0.0–1.5)
O2 Saturation: 55.3 %
Total hemoglobin: 12.1 g/dL (ref 12.0–16.0)

## 2019-12-16 LAB — URINALYSIS, ROUTINE W REFLEX MICROSCOPIC
Bilirubin Urine: NEGATIVE
Glucose, UA: 50 mg/dL — AB
Ketones, ur: NEGATIVE mg/dL
Leukocytes,Ua: NEGATIVE
Nitrite: NEGATIVE
Protein, ur: 30 mg/dL — AB
RBC / HPF: >50 RBC/hpf — ABNORMAL HIGH (ref 0–5)
Specific Gravity, Urine: 1.014 (ref 1.005–1.030)
pH: 5 (ref 5.0–8.0)

## 2019-12-16 LAB — DIC (DISSEMINATED INTRAVASCULAR COAGULATION)PANEL
D-Dimer, Quant: 2.67 ug/mL-FEU — ABNORMAL HIGH (ref 0.00–0.50)
Fibrinogen: 250 mg/dL (ref 210–475)
INR: 1.7 — ABNORMAL HIGH (ref 0.8–1.2)
Platelets: 40 10*3/uL — ABNORMAL LOW (ref 150–400)
Prothrombin Time: 19.5 seconds — ABNORMAL HIGH (ref 11.4–15.2)
Smear Review: NONE SEEN
aPTT: 80 seconds — ABNORMAL HIGH (ref 24–36)

## 2019-12-16 LAB — MAGNESIUM: Magnesium: 2.5 mg/dL — ABNORMAL HIGH (ref 1.7–2.4)

## 2019-12-16 MED ORDER — PRISMASOL BGK 4/2.5 32-4-2.5 MEQ/L REPLACEMENT SOLN: Status: DC

## 2019-12-16 MED ORDER — AMIODARONE HCL IN DEXTROSE 360-4.14 MG/200ML-% IV SOLN
60.0000 mg/h | INTRAVENOUS | Status: AC
  Administered 2019-12-16: 60 mg/h via INTRAVENOUS
  Filled 2019-12-16 (×2): qty 200

## 2019-12-16 MED ORDER — PRO-STAT SUGAR FREE PO LIQD
30.0000 mL | Freq: Every day | ORAL | Status: DC
  Administered 2019-12-17: 09:00:00 30 mL
  Filled 2019-12-16: qty 30

## 2019-12-16 MED ORDER — FREE WATER
400.0000 mL | Status: DC
  Administered 2019-12-16 – 2019-12-17 (×6): 400 mL

## 2019-12-16 MED ORDER — B COMPLEX-C PO TABS
1.0000 | ORAL_TABLET | Freq: Every day | ORAL | Status: DC
  Administered 2019-12-16 – 2019-12-27 (×11): 1 via ORAL
  Filled 2019-12-16 (×12): qty 1

## 2019-12-16 MED ORDER — AMIODARONE LOAD VIA INFUSION
150.0000 mg | Freq: Once | INTRAVENOUS | Status: AC
  Administered 2019-12-16: 150 mg via INTRAVENOUS
  Filled 2019-12-16: qty 83.34

## 2019-12-16 MED ORDER — POTASSIUM CHLORIDE 20 MEQ/15ML (10%) PO SOLN
20.0000 meq | Freq: Once | ORAL | Status: AC
  Administered 2019-12-16: 20 meq
  Filled 2019-12-16: qty 15

## 2019-12-16 MED ORDER — POTASSIUM CHLORIDE 10 MEQ/50ML IV SOLN
INTRAVENOUS | Status: AC
  Administered 2019-12-16: 10 meq via INTRAVENOUS
  Filled 2019-12-16: qty 150

## 2019-12-16 MED ORDER — AMIODARONE HCL IN DEXTROSE 360-4.14 MG/200ML-% IV SOLN
30.0000 mg/h | INTRAVENOUS | Status: DC
  Administered 2019-12-16 – 2019-12-19 (×6): 30 mg/h via INTRAVENOUS
  Filled 2019-12-16 (×6): qty 200

## 2019-12-16 MED ORDER — PRISMASOL BGK 4/2.5 32-4-2.5 MEQ/L IV SOLN: INTRAVENOUS | Status: DC

## 2019-12-16 MED ORDER — MIDAZOLAM HCL 2 MG/2ML IJ SOLN
1.0000 mg | INTRAMUSCULAR | Status: DC | PRN
  Administered 2019-12-16 – 2019-12-17 (×2): 1 mg via INTRAVENOUS
  Filled 2019-12-16 (×3): qty 2

## 2019-12-16 MED ORDER — LACTATED RINGERS IV BOLUS
500.0000 mL | Freq: Once | INTRAVENOUS | Status: AC
  Administered 2019-12-16: 500 mL via INTRAVENOUS

## 2019-12-16 MED ORDER — HEPARIN SODIUM (PORCINE) 1000 UNIT/ML DIALYSIS
1000.0000 [IU] | INTRAMUSCULAR | Status: DC | PRN
  Administered 2019-12-16: 3200 [IU] via INTRAVENOUS_CENTRAL
  Filled 2019-12-16 (×2): qty 6
  Filled 2019-12-16: qty 1
  Filled 2019-12-16: qty 3

## 2019-12-16 MED ORDER — VITAL AF 1.2 CAL PO LIQD
1000.0000 mL | ORAL | Status: DC
  Administered 2019-12-17: 1000 mL

## 2019-12-16 MED ORDER — POTASSIUM CHLORIDE 10 MEQ/50ML IV SOLN
10.0000 meq | INTRAVENOUS | Status: AC
  Administered 2019-12-16 (×3): 10 meq via INTRAVENOUS
  Filled 2019-12-16: qty 50

## 2019-12-16 NOTE — Progress Notes (Signed)
[facility] Progress Note Patient Name: Jeremy Parrish DOB: 1941-09-05 MRN: [id number]   Date of Service  12/16/2019  HPI/Events of Note  1. RN called on CRRT with good UOP w/ condom cath recently decreased . Bladder scan shows 600cc urine . Requests Foley .   2. K+ =3.5    eICU Interventions  1. Place Foley cath . Monitor strict I/O .  2. K Dur 20 meq x 1 VT . Renal panel pend for am.      Intervention Category Minor Interventions: Other:  Brenten Janney 12/16/2019, 9:08 PM

## 2019-12-16 NOTE — Progress Notes (Addendum)
Advanced Heart Failure Team Rounding Note   Primary Physician: Celene Squibb, MD PCP-Cardiologist:  Sanda Klein, MD   Requesting: Dr. Jacinta Shoe    HPI:    Remains intubated. FIO2 40%. Off sedation.   Respiratory Cx- morcella catarrhhalis--> was on meropenum but on  2/3 switched to zinacef.   Creatinine trending up 3.0>3.6>3.7>3.4 > 4.05>3.8>3.7 . Making > 2 liters of urine.   Nephrology consulted with plans to start CVVHD.   Past Medical History: Past Medical History:  Diagnosis Date  . Anemia   . Aortic aneurysm of unspecified site without mention of rupture   . Arthritis   . Bladder neck contracture   . Cancer ([place])   . Cerebral atherosclerosis    Carotid Doppler, 02/16/2013 - Bilateral Proximal ICAs,demonstrate mild plaque w/o evidence of significant diameter reduction, dissection, or any other vascular abnormality  . CHF (congestive heart failure) ([place])   . Complication of anesthesia   . COPD (chronic obstructive pulmonary disease) ([place])   . Coronary artery disease   . Depression   . Esophageal reflux   . Heart disease   . Heart murmur   . Hx of bladder cancer 10/07/2012  . Hyperlipidemia   . Hypertension   . 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 Intestinovesical fistula   . Kidney stones    history  . Lung mass   . Multiple myeloma   . Myocardial infarction [facility])    '96  . Non Hodgkin's lymphoma ([place])   . Paroxysmal atrial fibrillation ([place]) 01/02/2016  . Peripheral arterial disease ([place])   . Personal history of other diseases of circulatory system   . PONV (postoperative nausea and vomiting)   . Prostate cancer ([place]) 2000  . Shingles   . Shortness of breath   . Sleep apnea    05-02-14 cpap , not yet used- suggested settings 5  . Stroke [facility]) 2013   Speech.    Past Surgical History: Past Surgical History:  Procedure Laterality Date  . BLADDER SURGERY    . BONE MARROW  TRANSPLANT  2011  . CARDIOVERSION N/A 06/02/2019   Procedure: CARDIOVERSION;  Surgeon: Sanda Klein, MD;  Location: [facility] ENDOSCOPY;  Service: Cardiovascular;  Laterality: N/A;  . COLON SURGERY     colon resection  . COLONOSCOPY N/A 01/01/2013   Procedure: COLONOSCOPY;  Surgeon: Rogene Houston, MD;  Location: [facility] ENDO SUITE;  Service: Endoscopy;  Laterality: N/A;  825-moved to [place] notified pt  . COLONOSCOPY N/A 07/16/2018   Procedure: COLONOSCOPY;  Surgeon: Rogene Houston, MD;  Location: [facility] ENDO SUITE;  Service: Endoscopy;  Laterality: N/A;  1:25  . CORONARY ANGIOPLASTY  06/24/2000   PCI and stenting in mid & proximal RCA  . heart stents x 5  1999  . INGUINAL HERNIA REPAIR Right 05/04/2014   Procedure: OPEN RIGHT INGUINAL HERNIA REPAIR with mesh;  Surgeon: Edward Jolly, MD;  Location: [facility] ORS;  Service: General;  Laterality: Right;  . INSERT / REPLACE / REMOVE PACEMAKER    . left ear skin cancer removed    . NM MYOCAR PERF WALL MOTION  11/27/2007   inferior scar  . PACEMAKER INSERTION  07/22/2011   [employer]  . POLYPECTOMY  07/16/2018   Procedure: POLYPECTOMY;  Surgeon: Rogene Houston, MD;  Location: [facility] END[facility];  Service: Endoscopy;;  colon  . PORTACATH PLACEMENT  07/26/2009   right chest  . PROSTATE SURGERY    .  Rotator    . ROTATOR CUFF REPAIR Right   . SHOULDER ARTHROSCOPY WITH SUBACROMIAL DECOMPRESSION Right 07/21/2013   Procedure: RIGHT SHOULDER ARTHROSCOPY WITH SUBACROMIAL DECOMPRESSION AND DEBRIDEMENT & Injection of Left Shoulder;  Surgeon: Alta Corning, MD;  Location: [place];  Service: Orthopedics;  Laterality: Right;  . TEE WITHOUT CARDIOVERSION  10/13/2012   Procedure: TRANSESOPHAGEAL ECHOCARDIOGRAM (TEE);  Surgeon: Sanda Klein, MD;  Location: [facility] ENDOSCOPY;  Service: Cardiovascular;  Laterality: N/A;  pat/kay/echo notified  . US ECHOCARDIOGRAPHY  06/19/2011   RV mildly dilated,mild to mod. MR,mild AI,mild PI  . WRIST SURGERY     right    Family History: Family  History  Problem Relation Age of Onset  . Cancer Father        bladder  . Heart disease Father        before age 46  . Hypertension Mother   . Cancer Brother   . Heart disease Brother        before age 80  . Heart disease Sister        before age 49  . Hyperlipidemia Sister   . Hypertension Sister   . Heart attack Sister   . Colon cancer Neg Hx   . Colon polyps Neg Hx     Social History: Social History   Socioeconomic History  . Marital status: Married    Spouse name: [name]  . Number of children: 3  . Years of education: 9th  . Highest education level: Not on file  Occupational History  . Occupation: retired   Tobacco Use  . Smoking status: Former Smoker    Packs/day: 1.00    Years: 20.00    Pack years: 20.00    Types: Cigarettes    Quit date: 11/14/1994    Years since quitting: 25.1  . Smokeless tobacco: Never Used  Substance and Sexual Activity  . Alcohol use: No    Alcohol/week: 0.0 standard drinks    Comment: previously drank but none for at least 15 years.  . Drug use: No  . Sexual activity: Not on file  Other Topics Concern  . Not on file  Social History Narrative   Patient lives at home spouse.   Caffeine Use: Occasionally   Social Determinants of Health   Financial Resource Strain:   . Difficulty of Paying Living Expenses: Not on file  Food Insecurity:   . Worried About Charity fundraiser in the Last Year: Not on file  . Ran Out of Food in the Last Year: Not on file  Transportation Needs:   . Lack of Transportation (Medical): Not on file  . Lack of Transportation (Non-Medical): Not on file  Physical Activity:   . Days of Exercise per Week: Not on file  . Minutes of Exercise per Session: Not on file  Stress:   . Feeling of Stress : Not on file  Social Connections:   . Frequency of Communication with Friends and Family: Not on file  . Frequency of Social Gatherings with Friends and Family: Not on file  . Attends Religious Services: Not on  file  . Active Member of Clubs or Organizations: Not on file  . Attends Archivist Meetings: Not on file  . Marital Status: Not on file    Allergies:  Allergies  Allergen Reactions  . Morphine And Related Other (See Comments)    hallucinations  . Tape Rash    Paper tape is ok    Objective:  Vital Signs:   Temp:  [96.3 F (35.7 C)-99.5 F (37.5 C)] 96.3 F (35.7 C) (02/04 0700) Pulse Rate:  [79-119] 88 (02/04 0700) Resp:  [20-35] 31 (02/04 0700) BP: (93-156)/(57-106) 109/57 (02/04 0600) SpO2:  [81 %-99 %] 96 % (02/04 0700) FiO2 (%):  [40 %] 40 % (02/04 0400) Weight:  [78.2 kg] 78.2 kg (02/04 0500) Last BM Date: 12/15/19  Weight change: Filed Weights   12/14/19 0500 12/15/19 0500 12/16/19 0500  Weight: 78.9 kg 77.7 kg 78.2 kg    Intake/Output:   Intake/Output Summary (Last 24 hours) at 12/16/2019 0744 Last data filed at 12/16/2019 0600 Gross per 24 hour  Intake 1791.21 ml  Output 2475 ml  Net -683.79 ml      Physical Exam  CVP 3-4  General:Intubated. Opens eyes HEENT: normal Neck: supple. no JVD. Carotids 2+ bilat; no bruits. No lymphadenopathy or thryomegaly appreciated. Cor: PMI nondisplaced. Regular rate & rhythm. No rubs, gallops or murmurs. Lungs: clear Abdomen: soft, nontender, nondistended. No hepatosplenomegaly. No bruits or masses. Good bowel sounds. Extremities: no cyanosis, clubbing, rash, edema Neuro: intubated opens eyes does not follow commands  Telemetry   A fib with frequent PVCs. 2 episodes of NSVT 90-100s   EKG  N/A   Labs   Basic Metabolic Panel: Recent Labs  Lab 12/10/19 0457 12/10/19 1453 12/10/19 1458 12/10/19 1733 12/11/19 0419 12/11/19 0707 12/11/19 1622 12/12/19 0427 12/14/19 0405 12/14/19 0405 12/14/19 1256 12/14/19 1256 12/15/19 0133 12/15/19 0133 12/15/19 1447 12/16/19 0036 12/16/19 0444  NA   < >  --    < >  --  137   < >  --    < > 149*   < > 152*  --  154*  --  155* 155* 157*  K   < >  --     < >  --  3.2*   < > 3.9   < > 2.7*   < > 3.8  --  3.6  --  3.2* 3.1* 4.4  CL   < >  --   --   --  88*  --   --    < > 110   < > 108  --  111  --  114* 112* 117*  CO2   < >  --   --   --  33*  --   --    < > 25   < > 27  --  27  --  _0 GLUCOSE   < >  --   --   --  204*  --   --    < > 191*   < > 223*  --  211*  --  198* 175* 186*  BUN   < >  --   --   --  65*  --   --    < > 129*   < > 155*  --  165*  --  175* 177* 173*  CREATININE   < >  --   --   --  3.01*  --   --    < > 3.44*   < > 3.99*  --  4.05*  --  3.82* 3.70* 3.70*  CALCIUM   < >  --   --   --  6.7*  --   --    < > 7.3*   < > 8.9   < > 8.9   < > 8.5* 8.1*  8.2*  MG   < > 2.1   < > 2.1 1.9  --  2.2  --  2.1  --   --   --  2.7*  --   --  2.5*  --   PHOS  --  4.7*  --  4.5 4.2  --  4.4  --   --   --   --   --   --   --   --   --   --    < > = values in this interval not displayed.    Liver Function Tests: Recent Labs  Lab 12/10/19 0457 12/11/19 0708 12/12/19 0427 12/13/19 0419 12/15/19 0133  AST 3,286* 3,995* 1,047* 223* 71*  ALT 2,140* 3,197* 1,927* 1,175* 551*  ALKPHOS 77 70 74 87 115  BILITOT 2.5* 3.7* 2.9* 2.0* 1.5*  PROT 5.2* 5.4* 4.9* 4.8* 5.3*  ALBUMIN 2.6* 2.5* 2.2* 2.1* 2.1*   No results for input(s): LIPASE, AMYLASE in the last 168 hours. No results for input(s): AMMONIA in the last 168 hours.  CBC: Recent Labs  Lab 12/12/19 0427 12/13/19 0419 12/14/19 0405 12/15/19 0857 12/16/19 0444  WBC 7.5 7.8 9.9 10.2 8.0  HGB 10.2* 10.4* 11.2* 12.3* 11.6*  HCT 33.0* 33.0* 35.6* 39.3 37.4*  MCV 89.2 88.0 86.8 87.7 88.4  PLT 59* 54* 51* 50* 45*    Cardiac Enzymes: No results for input(s): CKTOTAL, CKMB, CKMBINDEX, TROPONINI in the last 168 hours.  BNP: BNP (last 3 results) Recent Labs    12/09/19 0130  BNP 2,803.0*    ProBNP (last 3 results) No results for input(s): PROBNP in the last 8760 hours.   CBG: Recent Labs  Lab 12/15/19 1151 12/15/19 1550 12/15/19 1957 12/15/19 2347 12/16/19 0348   GLUCAP 204* 175* 161* 155* 162*    Coagulation Studies: No results for input(s): LABPROT, INR in the last 72 hours.   Imaging   No results found.   Medications:     Current Medications: . amiodarone  150 mg Intravenous Once  . atorvastatin  10 mg Oral q1800  . carvedilol  6.25 mg Per Tube BID WC  . chlorhexidine gluconate (MEDLINE KIT)  15 mL Mouth Rinse BID  . Chlorhexidine Gluconate Cloth  6 each Topical Daily  . dextrose  50 mL Intravenous Once  . feeding supplement (PRO-STAT SUGAR FREE 64)  30 mL Per Tube BID  . free water  400 mL Per Tube Q6H  . heparin injection (subcutaneous)  5,000 Units Subcutaneous Q8H  . insulin aspart  0-15 Units Subcutaneous Q4H  . mouth rinse  15 mL Mouth Rinse 10 times per day  . pantoprazole (PROTONIX) IV  40 mg Intravenous Q12H  . sodium chloride flush  3 mL Intravenous Q12H    Infusions: . sodium chloride 10 mL/hr at 12/16/19 0200  . amiodarone     Followed by  . amiodarone    . cefUROXime (ZINACEF)  IV Stopped (12/15/19 2257)  . feeding supplement (VITAL AF 1.2 CAL) 55 mL/hr at 12/15/19 1800    Assessment/Plan   1. Cardiogenic shock/ Septic Shock - EF 30-35% by echo (newly down) - Unclear etiology ? AF vs ischemia.  Compounded by IV diltiazem  - Lactate > 11>9.8>3.1  - LFTs coming down.  - Blood CX 1/29 - No growth  - Respiratory Cx 1/29- moraxella - was on meropenum stopped 2/3 and switched to zinacef.  - Off pressors.  - CO-OX pending  2. Acute systolic HF -  management as above - ECHO 30-35%.  - Hold off on bb, arb, spiro for now.  - Creatinine down from 4.0>3.7  - . No diuretics for now.  - if recovers will need R/L cath  3. Acute hypoxic respiratory failure -Remains intubated - CCM on board   4. CAD - s/p previous PCIs - hs trop 30 -> 43 -> 43. Not c/w ACS  5. AKI - Creatinine 1.3-> 2.1->3.77->3.44>4.05>>3.8>3.7  - BUN 173  - Due to ATN from shock - Suspect will continue to get worse for a bit -  Continues to make > 2 liters of urine.  - Plans for CVVHD today. Discussed with Dr Hollie Salk at that bedside.    6. Chronic A fib  - Stop po amio start back on amio drip with NSVT and frequent PVCs  - On subcutaneous heparin.  - Platelets trending down.   7. Multiple myeloma - treated at [facility]  8. Dementia - unclear how severe this is  9. Thrombocytopenia Platelets 133 on admit.  Platelets trending down to 54>51>50>45   10. Hypernatremia Sodium trending up 149>152>154 >157 - Receiving free water   11. NSVT Frequent PVCs and NSVT overnight.  - K was 3.1  And received runs of K  - Stop po amio. Restart amio drip    Length of Stay: [place], NP  12/16/2019, 7:43 AM  Advanced Heart Failure Team Pager [phone] (M-F; 7a - 4p)  Please contact [facility] for night-coverage after hours (4p -7a ) and weekends on [email or web address]  Agree with above.  Remains intubated. Not waking up well. On D5 drip and FW flushes. BUN/creatinine rising. Co-ox and volume status ok. Have more ventricualr ectopy.   General:  Elderly ill appearing. On vent HEENT: normal Neck: supple. RIJ central line  Carotids 2+ bilat; no bruits. No lymphadenopathy or thryomegaly appreciated. Cor: PMI nondisplaced. Irregular rate & rhythm. No rubs, gallops or murmurs. Lungs: coarse Abdomen: soft, nontender, nondistended. No hepatosplenomegaly. No bruits or masses. Good bowel sounds. Extremities: no cyanosis, clubbing, rash, edema Neuro: on vent arousable but won't follow commands   D/w Renal. Plan for CRRT today. From cardiac perspective - output is stable off inotropes but having much more complex ectopy. Will switch amio to IV. Keep K> 4.0 MG > 2.0. He remains very tenuous.   CRITICAL CARE Performed by: Glori Bickers  Total critical care time: 35 minutes  Critical care time was exclusive of separately billable procedures and treating other patients.  Critical care was necessary to treat or prevent  imminent or life-threatening deterioration.  Critical care was time spent personally by me (independent of midlevel providers or residents) on the following activities: development of treatment plan with patient and/or surrogate as well as nursing, discussions with consultants, evaluation of patient's response to treatment, examination of patient, obtaining history from patient or surrogate, ordering and performing treatments and interventions, ordering and review of laboratory studies, ordering and review of radiographic studies, pulse oximetry and re-evaluation of patient's condition.  Glori Bickers, MD  8:19 AM

## 2019-12-16 NOTE — Progress Notes (Signed)
PCCM interval note  I spoke with the patient's wife at bedside.  Reviewed his status, plan of care.  She notes that he is waking up and indeed his mental status is improved this afternoon even from this morning.  She is concerned that he may need some sedation I think it is reasonable to start some low-dose intermittent sedation.  I do not want to negatively impact his ability to do spontaneous breathing in the next few days.  I explained that we should try to optimize his mental status, respiratory status and work towards an extubation in the next 24 to 48 hours.  She agrees.  She further indicates that she would not want him reintubated should he fail.  I support this philosophy and would plan for a one-way extubation soon, transition to comfort care if he were to fail.  I also clarified CODE STATUS with her.  He had been a limited code and I will change him to a full DNR if he were to arrest.  All medical care and current interventions are still appropriate.  Baltazar Apo, MD, PhD 12/16/2019, 5:40 PM [facility] [phone] or if no answer [phone]

## 2019-12-16 NOTE — Procedures (Signed)
Hemodialysis Catheter Insertion Procedure Note Jeremy Parrish [id number] Oct 03, 1941  Procedure: Insertion of Hemodialysis Catheter Indications: Dialysis Access   Procedure Details Consent: Risks of procedure as well as the alternatives and risks of each were explained to the (patient/caregiver).  Consent for procedure obtained. Time Out: Verified patient identification, verified procedure, site/side was marked, verified correct patient position, special equipment/implants available, medications/allergies/relevent history reviewed, required imaging and test results available.  Performed Real time [place] used to ID and cannulate vessel Maximum sterile technique was used including antiseptics, cap, gloves, gown, hand hygiene, mask and sheet. Skin prep: Chlorhexidine; local anesthetic administered Triple lumen hemodialysis catheter was inserted into right femoral vein due to patient being a dialysis patient using the Seldinger technique.  Evaluation Blood flow good Complications: No apparent complications Patient did tolerate procedure well. Chest X-ray ordered to verify placement.  CXR: not indicated .   Clementeen Graham 12/16/2019 Erick Colace ACNP-BC [place] Pager # [phone] OR # [phone] if no answer

## 2019-12-16 NOTE — Progress Notes (Signed)
[facility] Progress Note Patient Name: Jeremy Parrish DOB: November 28, 1940 MRN: [id number]   Date of Service  12/16/2019  HPI/Events of Note  K+ = 3.1, Mg++ = 2.5 and Creatinine = 3.70. Episodes of NSVT earlier.   eICU Interventions  Will replace K+.     Intervention Category Major Interventions: Electrolyte abnormality - evaluation and management  Jeremy Parrish 12/16/2019, 1:57 AM

## 2019-12-16 NOTE — Progress Notes (Signed)
NAME:  Jeremy Parrish, MRN:  [id number], DOB:  06-Dec-1940, LOS: 7 ADMISSION DATE:  12/09/2019, CONSULTATION DATE:  12/09/18 REFERRING MD:  Jeremy Parrish  CHIEF COMPLAINT: Septic shock.  Brief History   Jeremy Parrish is a 79 y/o male, with a PMH of aortic aneurysm, depression, bladder cancer, multiple myeloma, who presented from [facility] on 1/28 with A. Fib with RVR. On 1/29 he developed shock and was intubated and placed levophed and dobutamine. He has been successfully weaned off vasopressors and sedation, but patient is still intubated due to encephalopathy at this time.  Patient to start CRRT today to see if that improves his mental status and helps him liberate from the vent.  Past Medical History   Past Medical History:  Diagnosis Date  . Anemia   . Aortic aneurysm of unspecified site without mention of rupture   . Arthritis   . Bladder neck contracture   . Cancer ([place])   . Cerebral atherosclerosis    Carotid Doppler, 02/16/2013 - Bilateral Proximal ICAs,demonstrate mild plaque w/o evidence of significant diameter reduction, dissection, or any other vascular abnormality  . CHF (congestive heart failure) ([place])   . Complication of anesthesia   . COPD (chronic obstructive pulmonary disease) ([place])   . Coronary artery disease   . Depression   . Esophageal reflux   . Heart disease   . Heart murmur   . Hx of bladder cancer 10/07/2012  . Hyperlipidemia   . Hypertension   . 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 Intestinovesical fistula   . Kidney stones    history  . Lung mass   . Multiple myeloma   . Myocardial infarction [facility])    '96  . Non Hodgkin's lymphoma ([place])   . Paroxysmal atrial fibrillation ([place]) 01/02/2016  . Peripheral arterial disease ([place])   . Personal history of other diseases of circulatory system   . PONV (postoperative nausea and vomiting)   . Prostate cancer ([place]) 2000  . Shingles   . Shortness of  breath   . Sleep apnea    05-02-14 cpap , not yet used- suggested settings 5  . Stroke [facility]) 2013   Speech.     Significant Hospital Events   1/28 > Admission 1/29 > O/N Intubated 2/2 to cardiac shock. Pressures stabilizing.  2/1 > Successfully weaned off all vasopressors and sedation  Consults:  PCCM Nephrology HF   Procedures:  ETT 1/29 >  R radial art line 1/29 >  R IJ CVC 1/29 >   Significant Diagnostic Tests:  Echo 1/28 > EF 30-35%, mild LVH, mildly reduced RV systolic function.  Severely dilated LA, mod dilated RA.  Micro Data:  SARS CoV2 1/28 > neg. Flu 1/28 > neg. Blood 129 >> no growth at 2 days Respiratory 1/29 >> Moraxella catarrhalis, beta-lactamase positive >>   Antimicrobials:  Cefepime 1/29 >> 1/31 vanco 1/29 >> 1/31 Meropenem 1/31 >> 2/2 Cefuroxime 2/2 > 2/6 (full 7 day course)  Interim history/subjective:  Remains too sedated to extubate.  Overnight had NSVT.    Objective:  Blood pressure (!) 117/95, pulse 87, temperature (!) 96.3 F (35.7 C), resp. rate (!) 21, height 5' 7"  (1.702 m), weight 78.2 kg, SpO2 91 %. CVP:  [0 mmHg-6 mmHg] 2 mmHg  Vent Mode: PRVC FiO2 (%):  [40 %] 40 % Set Rate:  [12 bmp] 12 bmp Vt Set:  [520 mL] 520 mL PEEP:  [5 cmH20] 5  cmH20 Pressure Support:  [8 cmH20-10 cmH20] 8 cmH20 Plateau Pressure:  [10 cmH20-16 cmH20] 12 cmH20   Intake/Output Summary (Last 24 hours) at 12/16/2019 4431 Last data filed at 12/16/2019 0600 Gross per 24 hour  Intake 1703.68 ml  Output 2350 ml  Net -646.32 ml   Filed Weights   12/14/19 0500 12/15/19 0500 12/16/19 0500  Weight: 78.9 kg 77.7 kg 78.2 kg    Examination: General: Intubated, appears uncomfortable at times, slightly more awake Neuro: Sedated, responds to voice/commands intermittently HEENT: ET tube in good position, dry mucous membranes  Cardiovascular: RRR, no murmurs, rubs appreciated Lungs: CTAB  Abdomen: Nondistended, soft Musculoskeletal: No deformities, cyanosis, or  edema Skin: no rashes  Assessment & Plan:   Metabolic Encephalopathy / Hypoactive delirium: Sedation stopped 2/1, patient still encephalopathic.  Concern for sundowning as patient tends to get more agitated and restless at night. -see renal notes below -delirium precautions - lights and TV on during the day, sit patient up in bed -oxycodone 33m prn for severe pain -allow to wake up > extubate  Acute renal failure due to shock, ATN, hypoperfusion state.  Urine output remains good.  Creatinine 3.7 today from 4 yesterday.  I spoke with Jeremy Parrish the phone this morning (2/4).  She said that she spoke with nephrology yesterday (2/3) about the use of temporary, time-limited trial of CRRT (~48 hrs) to see if that improves his mental status and helps him liberate from the vent, and she is open to temporary CRRT at this time.  We do not believe that he is a good candidate for regular HD. -renal consult, appreciate recs -UA, Urine P/C ratio per nephrology -follow fluid recs per nephrology (FWF 400 q4, d/c D5) -renal function panel q4 hours -continue to follow urine output, BMP -renally dose medications  HTN: Improving -cont carvedilol 6.253m Moraxella pneumonia -cont cefuroxime for full 7 day course  A. Fib with RVR: Patient with PMH of A Fib.  Found to have LVEF of 30-35%, with a RV dysfunction from 40-45% (11/2018).  Some NSVT overnight (2/3-2/4).   -IV amiodarone -keep K+ >4, Mg+ >2 per cardiology -Anticoagulation currently on hold -Ultimately will likely require right and left heart catheterization, repeat echocardiogram to assess LV function improvement (from ?  Septic component cardiomyopathy)  Multiple Myeloma: Lenalidomide held, on IVIG as an outpatient -Following CBC  Transaminitis, improving -Continue to follow LFT intermittently -Follow coags  Thrombocytopenia.  Platelets decreased from 133 on admission (1/28) to 45 today (2/4).  Suspect due to sepsis and multiple myeloma.   HIT panel reassuring. -continue subcutaneous heparin for now -DIC panel  Multifactorial shock, cardiogenic and septic with associated Multi Organ Failure due to Moraxella pneumonia. -resolved  AG Metabolic Acidosis, due to lactic acidosis, acute renal failure.  -resolved  Best Practice:  Diet: NPO. Pain/Anxiety/Delirium protocol (if indicated): fentanyl gtt / midazolam gtt.  RASS goal -1. VAP protocol (if indicated): In place. DVT prophylaxis: SCD's /heparin subcutaneous GI prophylaxis: PPI. Glucose control: Sliding-scale insulin moderate scale Mobility: Bedrest. Code Status: Limited.  No CPR, no escalation.  Cardioversion or defib for acute arrhythmia acceptable Family Communication: discussed status and plans with his wife by phone 1/31.  Disposition: ICU.  Labs   CBC: Recent Labs  Lab 12/12/19 0427 12/13/19 0419 12/14/19 0405 12/15/19 0857 12/16/19 0444  WBC 7.5 7.8 9.9 10.2 8.0  HGB 10.2* 10.4* 11.2* 12.3* 11.6*  HCT 33.0* 33.0* 35.6* 39.3 37.4*  MCV 89.2 88.0 86.8 87.7 88.4  PLT  59* 54* 51* 50* 45*   Basic Metabolic Panel: Recent Labs  Lab 12/10/19 0457 12/10/19 1453 12/10/19 1458 12/10/19 1733 12/11/19 0419 12/11/19 0707 12/11/19 1622 12/12/19 0427 12/14/19 0405 12/14/19 0405 12/14/19 1256 12/15/19 0133 12/15/19 1447 12/16/19 0036 12/16/19 0444  NA   < >  --    < >  --  137   < >  --    < > 149*   < > 152* 154* 155* 155* 157*  K   < >  --    < >  --  3.2*   < > 3.9   < > 2.7*   < > 3.8 3.6 3.2* 3.1* 4.4  CL   < >  --   --   --  88*  --   --    < > 110   < > 108 111 114* 112* 117*  CO2   < >  --   --   --  33*  --   --    < > 25   < > 27 27 28 27 29   GLUCOSE   < >  --   --   --  204*  --   --    < > 191*   < > 223* 211* 198* 175* 186*  BUN   < >  --   --   --  65*  --   --    < > 129*   < > 155* 165* 175* 177* 173*  CREATININE   < >  --   --   --  3.01*  --   --    < > 3.44*   < > 3.99* 4.05* 3.82* 3.70* 3.70*  CALCIUM   < >  --   --   --  6.7*  --    --    < > 7.3*   < > 8.9 8.9 8.5* 8.1* 8.2*  MG   < > 2.1   < > 2.1 1.9  --  2.2  --  2.1  --   --  2.7*  --  2.5*  --   PHOS  --  4.7*  --  4.5 4.2  --  4.4  --   --   --   --   --   --   --   --    < > = values in this interval not displayed.   GFR: Estimated Creatinine Clearance: 15.6 mL/min (A) (by C-G formula based on SCr of 3.7 mg/dL (H)). Recent Labs  Lab 12/10/19 0457 12/10/19 1128 12/11/19 0419 12/11/19 0708 12/11/19 1023 12/11/19 1935 12/13/19 0419 12/14/19 0405 12/15/19 0857 12/16/19 0444  WBC  --   --    < >  --   --    < > 7.8 9.9 10.2 8.0  LATICACIDVEN >11.0* 9.8*  --  3.2* 3.1*  --   --   --   --   --    < > = values in this interval not displayed.   Liver Function Tests: Recent Labs  Lab 12/10/19 0457 12/11/19 0708 12/12/19 0427 12/13/19 0419 12/15/19 0133  AST 3,286* 3,995* 1,047* 223* 71*  ALT 2,140* 3,197* 1,927* 1,175* 551*  ALKPHOS 77 70 74 87 115  BILITOT 2.5* 3.7* 2.9* 2.0* 1.5*  PROT 5.2* 5.4* 4.9* 4.8* 5.3*  ALBUMIN 2.6* 2.5* 2.2* 2.1* 2.1*   No results for input(s): LIPASE, AMYLASE in the last 168 hours. No results  for input(s): AMMONIA in the last 168 hours. ABG    Component Value Date/Time   PHART 7.628 (HH) 12/11/2019 0707   PCO2ART 34.5 12/11/2019 0707   PO2ART 68.0 (L) 12/11/2019 0707   HCO3 36.1 (H) 12/11/2019 0707   TCO2 37 (H) 12/11/2019 0707   ACIDBASEDEF 19.0 (H) 12/10/2019 0419   O2SAT 67.8 12/15/2019 0137    Coagulation Profile: Recent Labs  Lab 12/09/19 0930  INR 1.7*   Cardiac Enzymes: No results for input(s): CKTOTAL, CKMB, CKMBINDEX, TROPONINI in the last 168 hours. HbA1C: Hgb A1c MFr Bld  Date/Time Value Ref Range Status  12/10/2019 09:07 PM 5.6 4.8 - 5.6 % Final    Comment:    (NOTE) Pre diabetes:          5.7%-6.4% Diabetes:              >6.4% Glycemic control for   <7.0% adults with diabetes   01/08/2019 10:57 AM 5.6 4.8 - 5.6 % Final    Comment:    (NOTE) Pre diabetes:          5.7%-6.4% Diabetes:               >6.4% Glycemic control for   <7.0% adults with diabetes    CBG: Recent Labs  Lab 12/15/19 1550 12/15/19 1957 12/15/19 2347 12/16/19 0348 12/16/19 0749  GLUCAP 175* 161* 155* 162* 162*    CRITICAL CARE Performed by: Mallie Darting,  MS4   Total critical care time: 40 minutes  Critical care time was exclusive of separately billable procedures and treating other patients.  Critical care was necessary to treat or prevent imminent or life-threatening deterioration.  Critical care was time spent personally by me on the following activities: development of treatment plan with patient and/or surrogate as well as nursing, discussions with consultants, evaluation of patient's response to treatment, examination of patient, obtaining history from patient or surrogate, ordering and performing treatments and interventions, ordering and review of laboratory studies, ordering and review of radiographic studies, pulse oximetry, re-evaluation of patient's condition and participation in multidisciplinary rounds.

## 2019-12-16 NOTE — Progress Notes (Signed)
Nutrition Follow-up  DOCUMENTATION CODES:   Not applicable  INTERVENTION:   Tube Feeding:  Vital AF 1.2 at 65 ml/hr Pro-Stat 30 mL daily Provides 132 g of protein, 1972 kcals and 1264 mL of free water  Total free water from TF and free water flushes of 400 mL q 4 hours: 3664 mL of free water  Current TF formula is low in sodium (1500 mg/day) and is composed of 81% free water  Add B-complex with C while on CRRT  NUTRITION DIAGNOSIS:   Inadequate oral intake related to acute illness as evidenced by NPO status.  Being addressed via TF   GOAL:   Patient will meet greater than or equal to 90% of their needs  Progressing  MONITOR:   TF tolerance, Vent status, Labs, Weight trends  REASON FOR ASSESSMENT:   Consult, Ventilator Enteral/tube feeding initiation and management  ASSESSMENT:   79 yo male admitted with a/fib with RVR and developed shock with multiple organ failure, acute respiratory failure requiring intubation. PMH includes CHF, COPD, HTN, CAD, HLD, hx of multiple myeloma  1/28 Admit, intubated 1/29 Trickle TF ordered 1/30 Titration of TF to goal rate 2/04 CRRT initiated  Patient is currently intubated on ventilator support, off pressors MV: 15.6 L/min Temp (24hrs), Avg:96.6 F (35.9 C), Min:93.9 F (34.4 C), Max:99.3 F (37.4 C)  Noted free water 400 mL q 4 hours ordered today. Remains hypernatremic, making>2 L of urine. Noted pt with diarrhea as well  Vital AF 1.2 at 55 ml/hr, Pro-Stat 30 mL BID per tube. Current formula is low in sodium (1500 mg/day) and 81% water  Current wt 78 kg; admission weight 79.2 kg  Labs: sodium 153 (H), potassium 3.3 (L), BUN 158, Creatinine 3.08 Meds: ss novolog  Diet Order:   Diet Order            Diet NPO time specified  Diet effective now              EDUCATION NEEDS:   Not appropriate for education at this time  Skin:  Skin Assessment: Skin Integrity Issues: Skin Integrity Issues:: Stage I, Other  (Comment) Stage I: coccyx Other: MASD: abdomen, groin, perineum  Last BM:  rectal tube  Height:   Ht Readings from Last 1 Encounters:  12/09/19 '5\' 7"'$  (1.702 m)    Weight:   Wt Readings from Last 1 Encounters:  12/16/19 78.2 kg    Ideal Body Weight:     BMI:  Body mass index is 27 kg/m.  Estimated Nutritional Needs:   Kcal:  1727 kcals  Protein:  115-145 g  Fluid:  >/= 1.7 L   Kerman Passey MS, RDN, LDN, CNSC RD Pager Number and Weekend/On-Call After Hours Pager Located in [place]

## 2019-12-16 NOTE — Progress Notes (Signed)
[facility] Progress Note    Assessment/ Plan:   1.  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  Will Send UA and UP/C.  He is making adequate urine but BUN rising; I think it's reasonable for a time- limited trial of CRRT (~48 hrs) to see if that improves mental status and helps him liberate from the vent.  Orders have been placed.  Effluent dose underdialyzed so as not to overcorrect hypernatremia.   2.  Hypernatremia:  This will improve with CRRT just because of the nature of dialysis- this will not fix the free water deficit.  To not overcorrect, have stopped D5 and modified FWF to 400 q 4.  Will need serial labs, order RFP q 4.   Would also recommend changing TF to lowest possible osms, would ask RD.  I doubt DI as he does not have polyuria.    3. Shock: mixed septic and cardiogenic picture, off pressors at this time.  AHF following  4.  Afib with RVR/ NSVT: on amio and coreg  5.  Moraxella pneumonia: on cefuroxime  6.  Dispo: remains in ICU   Subjective:    Intubated.  Although good UOP, BUN continues to rise.  Free water has been increased.     Objective:   BP (!) 109/57   Pulse 88   Temp (!) 96.3 F (35.7 C)   Resp (!) 31   Ht 5' 7" (1.702 m)   Wt 78.2 kg   SpO2 96%   BMI 27.00 kg/m   Intake/Output Summary (Last 24 hours) at 12/16/2019 0851 Last data filed at 12/16/2019 0600 Gross per 24 hour  Intake 1703.68 ml  Output 2350 ml  Net -646.32 ml   Weight change: 0.5 kg  Physical Exam: GEN: NAD, intubated, just shakes his head "no" HEENT ETT in place NECK no JVD PULM mechanical BS bilaterally CV tachycardic no m/r/g, loud S2 ABD  Mildly distended but soft and nontender EXT no LE edema NEURO intubated  SKIN no rashes  Imaging: No results found.  Labs: BMET Recent Labs  Lab 12/10/19 0457 12/10/19 1453 12/10/19 1458 12/10/19 1733  12/11/19 0419 12/11/19 0707 12/11/19 1622 12/12/19 0427 12/13/19 0419 12/14/19 0405 12/14/19 1256 12/15/19 0133 12/15/19 1447 12/16/19 0036 12/16/19 0444  NA   < >  --    < >  --  137   < >  --    < > 145 149* 152* 154* 155* 155* 157*  K   < >  --    < >  --  3.2*   < > 3.9   < > 3.5 2.7* 3.8 3.6 3.2* 3.1* 4.4  CL   < >  --   --   --  88*  --   --    < > 99 110 108 111 114* 112* 117*  CO2   < >  --   --   --  33*  --   --    < > _0 GLUCOSE   < >  --   --   --  204*  --   --    < > 187* 191* 223* 211* 198* 175* 186*  BUN   < >  --   --   --  65*  --   --    < > 127*  129* 155* 165* 175* 177* 173*  CREATININE   < >  --   --   --  3.01*  --   --    < > 3.77* 3.44* 3.99* 4.05* 3.82* 3.70* 3.70*  CALCIUM   < >  --   --   --  6.7*  --   --    < > 8.2* 7.3* 8.9 8.9 8.5* 8.1* 8.2*  PHOS  --  4.7*  --  4.5 4.2  --  4.4  --   --   --   --   --   --   --   --    < > = values in this interval not displayed.   CBC Recent Labs  Lab 12/13/19 0419 12/14/19 0405 12/15/19 0857 12/16/19 0444  WBC 7.8 9.9 10.2 8.0  HGB 10.4* 11.2* 12.3* 11.6*  HCT 33.0* 35.6* 39.3 37.4*  MCV 88.0 86.8 87.7 88.4  PLT 54* 51* 50* 45*    Medications:    . amiodarone  150 mg Intravenous Once  . atorvastatin  10 mg Oral q1800  . carvedilol  6.25 mg Per Tube BID WC  . chlorhexidine gluconate (MEDLINE KIT)  15 mL Mouth Rinse BID  . Chlorhexidine Gluconate Cloth  6 each Topical Daily  . dextrose  50 mL Intravenous Once  . feeding supplement (PRO-STAT SUGAR FREE 64)  30 mL Per Tube BID  . free water  400 mL Per Tube Q6H  . heparin injection (subcutaneous)  5,000 Units Subcutaneous Q8H  . insulin aspart  0-15 Units Subcutaneous Q4H  . mouth rinse  15 mL Mouth Rinse 10 times per day  . pantoprazole (PROTONIX) IV  40 mg Intravenous Q12H  . sodium chloride flush  3 mL Intravenous Q12H       , MD 12/16/2019, 8:51 AM  

## 2019-12-16 NOTE — Progress Notes (Signed)
[facility] Progress Note Patient Name: ZACHERI DALAL DOB: [birth year] MRN: [id number]   Date of Service  12/16/2019  HPI/Events of Note  Several episodes of NSVT - Now NSR with rate = 87.   eICU Interventions  Will order:  1. BMP and Mg++ level STAT.     Intervention Category Major Interventions: Arrhythmia - evaluation and management  Neriah Brott [place] 12/16/2019, 12:41 AM

## 2019-12-17 ENCOUNTER — Other Ambulatory Visit (HOSPITAL_COMMUNITY): Payer: Self-pay | Admitting: Hematology

## 2019-12-17 DIAGNOSIS — C9001 Multiple myeloma in remission: Secondary | ICD-10-CM

## 2019-12-17 LAB — CBC
HCT: 36.7 % — ABNORMAL LOW (ref 39.0–52.0)
Hemoglobin: 11.4 g/dL — ABNORMAL LOW (ref 13.0–17.0)
MCH: 27.3 pg (ref 26.0–34.0)
MCHC: 31.1 g/dL (ref 30.0–36.0)
MCV: 87.8 fL (ref 80.0–100.0)
Platelets: 43 10*3/uL — ABNORMAL LOW (ref 150–400)
RBC: 4.18 MIL/uL — ABNORMAL LOW (ref 4.22–5.81)
RDW: 21.5 % — ABNORMAL HIGH (ref 11.5–15.5)
WBC: 7.7 10*3/uL (ref 4.0–10.5)
nRBC: 0 % (ref 0.0–0.2)

## 2019-12-17 LAB — RENAL FUNCTION PANEL
Albumin: 1.8 g/dL — ABNORMAL LOW (ref 3.5–5.0)
Albumin: 1.9 g/dL — ABNORMAL LOW (ref 3.5–5.0)
Anion gap: 11 (ref 5–15)
Anion gap: 11 (ref 5–15)
BUN: 114 mg/dL — ABNORMAL HIGH (ref 8–23)
BUN: 105 mg/dL — ABNORMAL HIGH (ref 8–23)
CO2: 26 mmol/L (ref 22–32)
CO2: 25 mmol/L (ref 22–32)
Calcium: 7.8 mg/dL — ABNORMAL LOW (ref 8.9–10.3)
Calcium: 7.8 mg/dL — ABNORMAL LOW (ref 8.9–10.3)
Chloride: 111 mmol/L (ref 98–111)
Chloride: 107 mmol/L (ref 98–111)
Creatinine, Ser: 2.15 mg/dL — ABNORMAL HIGH (ref 0.61–1.24)
Creatinine, Ser: 2.13 mg/dL — ABNORMAL HIGH (ref 0.61–1.24)
GFR calc Af Amer: 33 mL/min — ABNORMAL LOW (ref >60)
GFR calc Af Amer: 34 mL/min — ABNORMAL LOW (ref >60)
GFR calc non Af Amer: 29 mL/min — ABNORMAL LOW (ref >60)
GFR calc non Af Amer: 29 mL/min — ABNORMAL LOW (ref >60)
Glucose, Bld: 154 mg/dL — ABNORMAL HIGH (ref 70–99)
Glucose, Bld: 130 mg/dL — ABNORMAL HIGH (ref 70–99)
Phosphorus: 4.3 mg/dL (ref 2.5–4.6)
Phosphorus: 5.4 mg/dL — ABNORMAL HIGH (ref 2.5–4.6)
Potassium: 4.1 mmol/L (ref 3.5–5.1)
Potassium: 4.1 mmol/L (ref 3.5–5.1)
Sodium: 148 mmol/L — ABNORMAL HIGH (ref 135–145)
Sodium: 143 mmol/L (ref 135–145)

## 2019-12-17 LAB — GLUCOSE, CAPILLARY
Glucose-Capillary: 132 mg/dL — ABNORMAL HIGH (ref 70–99)
Glucose-Capillary: 131 mg/dL — ABNORMAL HIGH (ref 70–99)
Glucose-Capillary: 156 mg/dL — ABNORMAL HIGH (ref 70–99)
Glucose-Capillary: 108 mg/dL — ABNORMAL HIGH (ref 70–99)
Glucose-Capillary: 165 mg/dL — ABNORMAL HIGH (ref 70–99)

## 2019-12-17 LAB — COOXEMETRY PANEL
Carboxyhemoglobin: 1.1 % (ref 0.5–1.5)
Methemoglobin: 0.9 % (ref 0.0–1.5)
O2 Saturation: 50.7 %
Total hemoglobin: 12.2 g/dL (ref 12.0–16.0)

## 2019-12-17 LAB — MAGNESIUM: Magnesium: 2.1 mg/dL (ref 1.7–2.4)

## 2019-12-17 LAB — PROTEIN / CREATININE RATIO, URINE
Creatinine, Urine: 34.72 mg/dL
Protein Creatinine Ratio: 1.44 mg/mg{Cre} — ABNORMAL HIGH (ref 0.00–0.15)
Total Protein, Urine: 50 mg/dL

## 2019-12-17 MED ORDER — FENTANYL CITRATE (PF) 100 MCG/2ML IJ SOLN
25.0000 ug | INTRAMUSCULAR | Status: DC | PRN
  Administered 2019-12-17: 50 ug via INTRAVENOUS
  Filled 2019-12-17: qty 2

## 2019-12-17 MED ORDER — FENTANYL CITRATE (PF) 100 MCG/2ML IJ SOLN
25.0000 ug | INTRAMUSCULAR | Status: DC | PRN
  Administered 2019-12-17 – 2019-12-18 (×3): 50 ug via INTRAVENOUS
  Administered 2019-12-18: 25 ug via INTRAVENOUS
  Administered 2019-12-18 – 2019-12-19 (×4): 50 ug via INTRAVENOUS
  Filled 2019-12-17 (×8): qty 2

## 2019-12-17 MED ORDER — ARTIFICIAL TEARS OPHTHALMIC OINT: TOPICAL_OINTMENT | OPHTHALMIC | Status: DC | PRN

## 2019-12-17 MED ORDER — DEXAMETHASONE SODIUM PHOSPHATE 10 MG/ML IJ SOLN
10.0000 mg | Freq: Once | INTRAMUSCULAR | Status: AC
  Administered 2019-12-17: 15:00:00 10 mg via INTRAVENOUS
  Filled 2019-12-17 (×2): qty 1

## 2019-12-17 MED ORDER — HEPARIN SODIUM (PORCINE) 1000 UNIT/ML DIALYSIS
1000.0000 [IU] | INTRAMUSCULAR | Status: DC | PRN
  Administered 2019-12-17: 12:00:00 3200 [IU] via INTRAVENOUS_CENTRAL
  Filled 2019-12-17 (×2): qty 6

## 2019-12-17 MED ORDER — NOREPINEPHRINE 16 MG/250ML-% IV SOLN
0.0000 ug/min | INTRAVENOUS | Status: DC
  Administered 2019-12-17: 09:00:00 2 ug/min via INTRAVENOUS
  Filled 2019-12-17: qty 250

## 2019-12-17 MED ORDER — PHENYLEPHRINE HCL-NACL 10-0.9 MG/250ML-% IV SOLN
0.0000 ug/min | INTRAVENOUS | Status: DC
  Administered 2019-12-17: 20 ug/min via INTRAVENOUS
  Filled 2019-12-17 (×2): qty 250

## 2019-12-17 MED ORDER — NOREPINEPHRINE 4 MG/250ML-% IV SOLN: 0.0000 ug/min | INTRAVENOUS | Status: DC

## 2019-12-17 MED ORDER — HEPARIN SODIUM (PORCINE) 1000 UNIT/ML DIALYSIS: 1000.0000 [IU] | INTRAMUSCULAR | Status: DC | PRN

## 2019-12-17 NOTE — Progress Notes (Signed)
4pm renal panel not resulted yet. Called Dr. Carolin Sicks and discussed plan of care with him. He stated they would not restart CRRT tonight unless K >6, but to page on call MD, Dr. Johnney Ou, if labs were grossly abnormal. Will continue to monitor.  Joellen Jersey, RN

## 2019-12-17 NOTE — Progress Notes (Signed)
Advanced Heart Failure Team Rounding Note   Primary Physician: Celene Squibb, MD PCP-Cardiologist:  Sanda Klein, MD   Requesting: Dr. Jacinta Shoe    HPI:    Remains intubated. Awake. Weaning on PS/CPAP at 50%. Having periods of apnea.   On CVVHD keeping even. Had urinary retention yesterday and Foley palced with 700 cc out.   Now on Neo 5.   Respiratory Cx- morcella catarrhhalis--> was on meropenum but on  2/3 switched to zinacef.   PVCs/NSVT improved with IV amio. Co-ox 51%  Past Medical History: Past Medical History:  Diagnosis Date  . Anemia   . Aortic aneurysm of unspecified site without mention of rupture   . Arthritis   . Bladder neck contracture   . Cancer ([place])   . Cerebral atherosclerosis    Carotid Doppler, 02/16/2013 - Bilateral Proximal ICAs,demonstrate mild plaque w/o evidence of significant diameter reduction, dissection, or any other vascular abnormality  . CHF (congestive heart failure) ([place])   . Complication of anesthesia   . COPD (chronic obstructive pulmonary disease) ([place])   . Coronary artery disease   . Depression   . Esophageal reflux   . Heart disease   . Heart murmur   . Hx of bladder cancer 10/07/2012  . Hyperlipidemia   . Hypertension   . 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 Intestinovesical fistula   . Kidney stones    history  . Lung mass   . Multiple myeloma   . Myocardial infarction [facility])    '96  . Non Hodgkin's lymphoma ([place])   . Paroxysmal atrial fibrillation ([place]) 01/02/2016  . Peripheral arterial disease ([place])   . Personal history of other diseases of circulatory system   . PONV (postoperative nausea and vomiting)   . Prostate cancer ([place]) 2000  . Shingles   . Shortness of breath   . Sleep apnea    05-02-14 cpap , not yet used- suggested settings 5  . Stroke [facility]) 2013   Speech.    Past Surgical History: Past Surgical History:  Procedure  Laterality Date  . BLADDER SURGERY    . BONE MARROW TRANSPLANT  2011  . CARDIOVERSION N/A 06/02/2019   Procedure: CARDIOVERSION;  Surgeon: Sanda Klein, MD;  Location: [facility] ENDOSCOPY;  Service: Cardiovascular;  Laterality: N/A;  . COLON SURGERY     colon resection  . COLONOSCOPY N/A 01/01/2013   Procedure: COLONOSCOPY;  Surgeon: Rogene Houston, MD;  Location: [facility] ENDO SUITE;  Service: Endoscopy;  Laterality: N/A;  825-moved to [place] notified pt  . COLONOSCOPY N/A 07/16/2018   Procedure: COLONOSCOPY;  Surgeon: Rogene Houston, MD;  Location: [facility] ENDO SUITE;  Service: Endoscopy;  Laterality: N/A;  1:25  . CORONARY ANGIOPLASTY  06/24/2000   PCI and stenting in mid & proximal RCA  . heart stents x 5  1999  . INGUINAL HERNIA REPAIR Right 05/04/2014   Procedure: OPEN RIGHT INGUINAL HERNIA REPAIR with mesh;  Surgeon: Edward Jolly, MD;  Location: [facility] ORS;  Service: General;  Laterality: Right;  . INSERT / REPLACE / REMOVE PACEMAKER    . left ear skin cancer removed    . NM MYOCAR PERF WALL MOTION  11/27/2007   inferior scar  . PACEMAKER INSERTION  07/22/2011   [employer]  . POLYPECTOMY  07/16/2018   Procedure: POLYPECTOMY;  Surgeon: Rogene Houston, MD;  Location: [facility] ENDO SUITE;  Service: Endoscopy;;  colon  .  PORTACATH PLACEMENT  07/26/2009   right chest  . PROSTATE SURGERY    . Rotator    . ROTATOR CUFF REPAIR Right   . SHOULDER ARTHROSCOPY WITH SUBACROMIAL DECOMPRESSION Right 07/21/2013   Procedure: RIGHT SHOULDER ARTHROSCOPY WITH SUBACROMIAL DECOMPRESSION AND DEBRIDEMENT & Injection of Left Shoulder;  Surgeon: Alta Corning, MD;  Location: [place];  Service: Orthopedics;  Laterality: Right;  . TEE WITHOUT CARDIOVERSION  10/13/2012   Procedure: TRANSESOPHAGEAL ECHOCARDIOGRAM (TEE);  Surgeon: Sanda Klein, MD;  Location: [facility] ENDOSCOPY;  Service: Cardiovascular;  Laterality: N/A;  pat/kay/echo notified  . US ECHOCARDIOGRAPHY  06/19/2011   RV mildly dilated,mild to mod. MR,mild AI,mild PI  .  WRIST SURGERY     right    Family History: Family History  Problem Relation Age of Onset  . Cancer Father        bladder  . Heart disease Father        before age 37  . Hypertension Mother   . Cancer Brother   . Heart disease Brother        before age 13  . Heart disease Sister        before age 25  . Hyperlipidemia Sister   . Hypertension Sister   . Heart attack Sister   . Colon cancer Neg Hx   . Colon polyps Neg Hx     Social History: Social History   Socioeconomic History  . Marital status: Married    Spouse name: [name]  . Number of children: 3  . Years of education: 9th  . Highest education level: Not on file  Occupational History  . Occupation: retired   Tobacco Use  . Smoking status: Former Smoker    Packs/day: 1.00    Years: 20.00    Pack years: 20.00    Types: Cigarettes    Quit date: 11/14/1994    Years since quitting: 25.1  . Smokeless tobacco: Never Used  Substance and Sexual Activity  . Alcohol use: No    Alcohol/week: 0.0 standard drinks    Comment: previously drank but none for at least 15 years.  . Drug use: No  . Sexual activity: Not on file  Other Topics Concern  . Not on file  Social History Narrative   Patient lives at home spouse.   Caffeine Use: Occasionally   Social Determinants of Health   Financial Resource Strain:   . Difficulty of Paying Living Expenses: Not on file  Food Insecurity:   . Worried About Charity fundraiser in the Last Year: Not on file  . Ran Out of Food in the Last Year: Not on file  Transportation Needs:   . Lack of Transportation (Medical): Not on file  . Lack of Transportation (Non-Medical): Not on file  Physical Activity:   . Days of Exercise per Week: Not on file  . Minutes of Exercise per Session: Not on file  Stress:   . Feeling of Stress : Not on file  Social Connections:   . Frequency of Communication with Friends and Family: Not on file  . Frequency of Social Gatherings with Friends and  Family: Not on file  . Attends Religious Services: Not on file  . Active Member of Clubs or Organizations: Not on file  . Attends Archivist Meetings: Not on file  . Marital Status: Not on file    Allergies:  Allergies  Allergen Reactions  . Morphine And Related Other (See Comments)    hallucinations  .  Tape Rash    Paper tape is ok    Objective:    Vital Signs:   Temp:  [91.8 F (33.2 C)-96.7 F (35.9 C)] 96.7 F (35.9 C) (02/05 0745) Pulse Rate:  [59-88] 65 (02/05 0900) Resp:  [14-45] 29 (02/05 0900) BP: (67-118)/(47-87) 101/71 (02/05 0900) SpO2:  [85 %-98 %] 91 % (02/05 0900) FiO2 (%):  [40 %-50 %] 50 % (02/05 0740) Weight:  [80.1 kg] 80.1 kg (02/05 0500) Last BM Date: 12/15/19  Weight change: Filed Weights   12/15/19 0500 12/16/19 0500 12/17/19 0500  Weight: 77.7 kg 78.2 kg 80.1 kg    Intake/Output:   Intake/Output Summary (Last 24 hours) at 12/17/2019 0903 Last data filed at 12/17/2019 0900 Gross per 24 hour  Intake 6966.69 ml  Output 1792 ml  Net 5174.69 ml      Physical Exam  CVP 6 General:  Intubated. Awake on vent HEENT: normal Neck: supple. RIJ sedation. Carotids 2+ bilat; no bruits. No lymphadenopathy or thryomegaly appreciated. Cor: PMI nondisplaced. Mildly irregular  Lungs: coarse Abdomen: soft, nontender, nondistended. No hepatosplenomegaly. No bruits or masses. Good bowel sounds. Extremities: no cyanosis, clubbing, rash, edema + boots Neuro: awake on vent  Telemetry   A fib with PVCs  60-70s Personally reviewed   Labs   Basic Metabolic Panel: Recent Labs  Lab 12/11/19 1622 12/12/19 0427 12/14/19 0405 12/14/19 1256 12/15/19 0133 12/15/19 1447 12/16/19 0036 12/16/19 0036 12/16/19 0444 12/16/19 0444 12/16/19 1332 12/16/19 1332 12/16/19 1530 12/16/19 2017 12/17/19 0343 12/17/19 0344  NA  --    < > 149*   < > 154*   < > 155*   < > 157*  --  153*  --  153* 151* 148*  --   K 3.9   < > 2.7*   < > 3.6   < > 3.1*   < >  4.4  --  2.9*  --  3.3* 3.5 4.1  --   CL  --    < > 110   < > 111   < > 112*   < > 117*  --  119*  --  113* 112* 111  --   CO2  --    < > 25   < > 27   < > 27   < > 29  --  22  --  27 26 26   --   GLUCOSE  --    < > 191*   < > 211*   < > 175*   < > 186*  --  162*  --  195* 177* 154*  --   BUN  --    < > 129*   < > 165*   < > 177*   < > 173*  --  144*  --  158* 140* 114*  --   CREATININE  --    < > 3.44*   < > 4.05*   < > 3.70*   < > 3.70*  --  2.83*  --  3.08* 2.72* 2.15*  --   CALCIUM  --    < > 7.3*   < > 8.9   < > 8.1*   < > 8.2*   < > 6.8*   < > 7.7* 7.8* 7.8*  --   MG 2.2  --  2.1  --  2.7*  --  2.5*  --   --   --   --   --   --   --   --  2.1  PHOS 4.4  --   --   --   --   --   --   --   --   --  4.3  --  4.9* 4.5 4.3  --    < > = values in this interval not displayed.    Liver Function Tests: Recent Labs  Lab 12/11/19 0708 12/11/19 0708 12/12/19 0427 12/12/19 0427 12/13/19 0419 12/13/19 0419 12/15/19 0133 12/16/19 1332 12/16/19 1530 12/16/19 2017 12/17/19 0343  AST 3,995*  --  1,047*  --  223*  --  71*  --   --   --   --   ALT 3,197*  --  1,927*  --  1,175*  --  551*  --   --   --   --   ALKPHOS 70  --  74  --  87  --  115  --   --   --   --   BILITOT 3.7*  --  2.9*  --  2.0*  --  1.5*  --   --   --   --   PROT 5.4*  --  4.9*  --  4.8*  --  5.3*  --   --   --   --   ALBUMIN 2.5*   < > 2.2*   < > 2.1*   < > 2.1* 1.4* 1.8* 1.9* 1.8*   < > = values in this interval not displayed.   No results for input(s): LIPASE, AMYLASE in the last 168 hours. No results for input(s): AMMONIA in the last 168 hours.  CBC: Recent Labs  Lab 12/13/19 0419 12/13/19 0419 12/14/19 0405 12/15/19 0857 12/16/19 0444 12/16/19 1530 12/17/19 0344  WBC 7.8  --  9.9 10.2 8.0  --  7.7  HGB 10.4*  --  11.2* 12.3* 11.6*  --  11.4*  HCT 33.0*  --  35.6* 39.3 37.4*  --  36.7*  MCV 88.0  --  86.8 87.7 88.4  --  87.8  PLT 54*   < > 51* 50* 45* 40* 43*   < > = values in this interval not displayed.     Cardiac Enzymes: No results for input(s): CKTOTAL, CKMB, CKMBINDEX, TROPONINI in the last 168 hours.  BNP: BNP (last 3 results) Recent Labs    12/09/19 0130  BNP 2,803.0*    ProBNP (last 3 results) No results for input(s): PROBNP in the last 8760 hours.   CBG: Recent Labs  Lab 12/16/19 1956 12/16/19 2022 12/16/19 2335 12/17/19 0318 12/17/19 0743  GLUCAP 153* 143* 151* 132* 131*    Coagulation Studies: Recent Labs    12/16/19 1530  LABPROT 19.5*  INR 1.7*     Imaging   US RENAL  Result Date: 12/16/2019 CLINICAL DATA:  79 year old male with acute renal insufficiency. EXAM: RENAL / URINARY TRACT ULTRASOUND COMPLETE COMPARISON:  Abdominal ultrasound dated 12/04/2017 and CT abdomen pelvis dated 11/18/2019. FINDINGS: Right Kidney: Renal measurements: 10.1 x 5.1 x 3.0 cm = volume: 80 mL. Mild parenchyma atrophy. Normal echogenicity. No hydronephrosis or shadowing stone. Left Kidney: Renal measurements: 11.1 x 5.0 x 4.1 cm = volume: 120 mL. Mild parenchyma atrophy. Normal echogenicity. No hydronephrosis or shadowing stone. There is a 3.8 x 4.2 x 3.5 cm left renal inferior pole cyst. Bladder: Appears normal for degree of bladder distention. Other: Fatty infiltration of the visualized liver. Trace perihepatic fluid (image 17/52 and 4/52). IMPRESSION: 1. Mild parenchyma atrophy.  No hydronephrosis or shadowing  stone. 2. Left renal inferior pole cyst. 3. Fatty liver 4. Probable trace perihepatic fluid. Electronically Signed   By: Anner Crete M.D.   On: 12/16/2019 21:14     Medications:     Current Medications: . atorvastatin  10 mg Oral q1800  . B-complex with vitamin C  1 tablet Oral Daily  . chlorhexidine gluconate (MEDLINE KIT)  15 mL Mouth Rinse BID  . Chlorhexidine Gluconate Cloth  6 each Topical Daily  . dextrose  50 mL Intravenous Once  . feeding supplement (PRO-STAT SUGAR FREE 64)  30 mL Per Tube Daily  . free water  400 mL Per Tube Q4H  . heparin injection  (subcutaneous)  5,000 Units Subcutaneous Q8H  . insulin aspart  0-15 Units Subcutaneous Q4H  . mouth rinse  15 mL Mouth Rinse 10 times per day  . pantoprazole (PROTONIX) IV  40 mg Intravenous Q12H  . sodium chloride flush  3 mL Intravenous Q12H    Infusions: .  prismasol BGK 4/2.5 400 mL/hr at 12/16/19 1255  .  prismasol BGK 4/2.5 200 mL/hr at 12/16/19 1254  . sodium chloride 10 mL/hr at 12/17/19 0900  . amiodarone 30 mg/hr (12/17/19 0900)  . cefUROXime (ZINACEF)  IV Stopped (12/16/19 2154)  . feeding supplement (VITAL AF 1.2 CAL)    . norepinephrine (LEVOPHED) Adult infusion    . prismasol BGK 4/2.5 1,000 mL/hr at 12/17/19 0804    Assessment/Plan   1. Cardiogenic shock/ Septic Shock - EF 30-35% by echo (newly down) - Unclear etiology ? AF vs ischemia.  Compounded by IV diltiazem  - Blood CX 1/29 - No growth  - Respiratory Cx 1/29- moraxella - was on meropenum stopped 2/3 and switched to zinacef.  - Back on pressors. Co-ox low at 51% - Will switch neo to NE. Follow co-ox.    2. Acute systolic HF - management as above - ECHO 30-35%.  - Hold off on bb, arb, spiro for now.  - Back on pressors. Co-ox low at 52% - Will switch neo to NE. Follow co-ox.   - if recovers will need R/L cath but given AKI likely defer   3. Acute hypoxic respiratory failure -Remains intubated - CCM on board - Attempting to wean today  4. CAD - s/p previous PCIs - hs trop 30 -> 43 -> 43. Not c/w ACS. No change  5. AKI - Creatinine 1.3-> 2.1->3.77->3.44>4.05>>3.8>3.7  - BUN 173  - Due to ATN from shock - CVVHD started 2/4 - Renal following    6. Chronic A fib  - On on amio drip with NSVT and frequent PVCs  - On subcutaneous heparin and not IV heparin  - Platelets trending down to 40k - If platelets recover can start heparin   7. Multiple myeloma - [facility] at [facility]  8. Dementia - unclear how severe this is  9. Thrombocytopenia - Platelets 133 on admit.  - Platelets trending down to  54>51>50>45-> 43 - HIT negative  10. Hypernatremia - Improving with CVVHD - Receiving free water   11. NSVT - Frequent PVCs and NSVT overnight.  - Improved with IV amio. Continue for now.  - Keep K>4.0 Mg > 2.0   CRITICAL CARE Performed by: Glori Bickers  Total critical care time: 45 minutes  Critical care time was exclusive of separately billable procedures and treating other patients.  Critical care was necessary to treat or prevent imminent or life-threatening deterioration.  Critical care was time spent personally by me (independent  of midlevel providers or residents) on the following activities: development of treatment plan with patient and/or surrogate as well as nursing, discussions with consultants, evaluation of patient's response to treatment, examination of patient, obtaining history from patient or surrogate, ordering and performing treatments and interventions, ordering and review of laboratory studies, ordering and review of radiographic studies, pulse oximetry and re-evaluation of patient's condition.   Length of Stay: [place], MD  12/17/2019, 9:03 AM  Advanced Heart Failure Team Pager [phone] (M-F; 7a - 4p)  Please contact [facility] for night-coverage after hours (4p -7a ) and weekends on [email or web address]

## 2019-12-17 NOTE — Plan of Care (Signed)
  Problem: Nutrition: Goal: Adequate nutrition will be maintained Outcome: Progressing   Problem: Clinical Measurements: Goal: Respiratory complications will improve Outcome: Not Progressing   Problem: Coping: Goal: Level of anxiety will decrease Outcome: Not Progressing   Problem: Elimination: Goal: Will not experience complications related to urinary retention Outcome: Not Progressing

## 2019-12-17 NOTE — Progress Notes (Signed)
[facility] Progress Note Patient Name: Jeremy Parrish DOB: [birth year] MRN: [id number]   Date of Service  12/17/2019  HPI/Events of Note  Hypotension - BP = 81/63 with MAP = 63. Related to sedation with Versed and +/- CRRT. Hx AFIB.  eICU Interventions  Will order: 1. Phenylephrine IV infusion. Titrate to MAP >= 65.      Intervention Category Major Interventions: Hypotension - evaluation and management  Jehu Mccauslin [place] 12/17/2019, 2:17 AM

## 2019-12-17 NOTE — Procedures (Signed)
Extubation Procedure Note  Patient Details:   Name: Jeremy Parrish DOB: 06-07-1941 MRN: [id number]   Airway Documentation:    Vent end date: 12/17/19 Vent end time: 1045   Evaluation  O2 sats: stable throughout Complications: No apparent complications Patient did tolerate procedure well. Bilateral Breath Sounds: Clear   Yes   Patient extubated to 6L [place] per MD order.  Positive cuff leak noted.  No evidence of stridor.  Patient able to speak post extubation.  Sats currently 95%.  Vitals are stable.  No complications noted at this time.  Will continue to monitor.   Judith Part 12/17/2019, 11:00 AM

## 2019-12-17 NOTE — Progress Notes (Signed)
Chaplain provided support to Jeremy Parrish, his wife and daughter.  Chaplain will follow-up.

## 2019-12-17 NOTE — Progress Notes (Signed)
Jeremy Parrish  Assessment/ Plan: Pt is a 79 y.o. yo male with cardiogenic shock, sepsis, respiratory failure with AKI and uremia. CRRT since 2/4.  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 UA with some RBC and spot UPC 1.44. He is nonoliguric however became uremic therefore he started on time- limited trial of CRRT (~48 hrs) to see if that improves mental status and helps him liberate from the vent.    Monitor sodium level.  No net UF.  #Hypernatremia:  With free water deficit.  Currently on CRRT and also receiving free water. Would also recommend changing TF to lowest possible osms.  No DI as patient is not having polyuria.  #Shock: mixed septic and cardiogenic picture, requiring phenylephrine on and off. HF following  #Afib with RVR/ NSVT: on amio and coreg  #Moraxella pneumonia: on cefuroxime  # Dispo: remains in ICU.  He is DNR.  Subjective: Seen and examined in ICU.  Tolerating CRRT well.  His wife at bedside.  Patient opens eyes.  No new event. Objective Vital signs in last 24 hours: Vitals:   12/17/19 0418 12/17/19 0500 12/17/19 0600 12/17/19 0700  BP:  95/73  96/74  Pulse:  64 75 81  Resp:  (!) 34 (!) 28 (!) 26  Temp:  (!) 95.2 F (35.1 C)    TempSrc:      SpO2: 98% (!) 88% 96% 98%  Weight:  80.1 kg    Height:       Weight change: 1.9 kg  Intake/Output Summary (Last 24 hours) at 12/17/2019 0731 Last data filed at 12/17/2019 0700 Gross per 24 hour  Intake 6478.87 ml  Output 1404 ml  Net 5074.87 ml       Labs: Basic Metabolic Panel: Recent Labs  Lab 12/16/19 1530 12/16/19 2017 12/17/19 0343  NA 153* 151* 148*  K 3.3* 3.5 4.1  CL 113* 112* 111  CO2 '27 26 26  '$ GLUCOSE 195* 177* 154*  BUN 158* 140* 114*  CREATININE 3.08* 2.72* 2.15*  CALCIUM 7.7* 7.8* 7.8*  PHOS 4.9* 4.5 4.3   Liver Function Tests: Recent  Labs  Lab 12/12/19 0427 12/12/19 0427 12/13/19 0419 12/13/19 0419 12/15/19 0133 12/16/19 1332 12/16/19 1530 12/16/19 2017 12/17/19 0343  AST 1,047*  --  223*  --  71*  --   --   --   --   ALT 1,927*  --  1,175*  --  551*  --   --   --   --   ALKPHOS 74  --  87  --  115  --   --   --   --   BILITOT 2.9*  --  2.0*  --  1.5*  --   --   --   --   PROT 4.9*  --  4.8*  --  5.3*  --   --   --   --   ALBUMIN 2.2*   < > 2.1*   < > 2.1*   < > 1.8* 1.9* 1.8*   < > = values in this interval not displayed.   No results for input(s): LIPASE, AMYLASE in the last 168 hours. No results for input(s): AMMONIA in the last 168 hours. CBC: Recent Labs  Lab 12/13/19 0419 12/13/19 0419 12/14/19 0405 12/14/19 0405 12/15/19 0857 12/15/19 0857 12/16/19 0444 12/16/19 1530 12/17/19 0344  WBC 7.8   < >  9.9   < > 10.2  --  8.0  --  7.7  HGB 10.4*   < > 11.2*   < > 12.3*  --  11.6*  --  11.4*  HCT 33.0*   < > 35.6*   < > 39.3  --  37.4*  --  36.7*  MCV 88.0  --  86.8  --  87.7  --  88.4  --  87.8  PLT 54*   < > 51*   < > 50*   < > 45* 40* 43*   < > = values in this interval not displayed.   Cardiac Enzymes: No results for input(s): CKTOTAL, CKMB, CKMBINDEX, TROPONINI in the last 168 hours. CBG: Recent Labs  Lab 12/16/19 1539 12/16/19 1956 12/16/19 2022 12/16/19 2335 12/17/19 0318  GLUCAP 166* 153* 143* 151* 132*    Iron Studies: No results for input(s): IRON, TIBC, TRANSFERRIN, FERRITIN in the last 72 hours. Studies/Results: US RENAL  Result Date: 12/16/2019 CLINICAL DATA:  79 year old male with acute renal insufficiency. EXAM: RENAL / URINARY TRACT ULTRASOUND COMPLETE COMPARISON:  Abdominal ultrasound dated 12/04/2017 and CT abdomen pelvis dated 11/18/2019. FINDINGS: Right Kidney: Renal measurements: 10.1 x 5.1 x 3.0 cm = volume: 80 mL. Mild parenchyma atrophy. Normal echogenicity. No hydronephrosis or shadowing stone. Left Kidney: Renal measurements: 11.1 x 5.0 x 4.1 cm = volume: 120 mL.  Mild parenchyma atrophy. Normal echogenicity. No hydronephrosis or shadowing stone. There is a 3.8 x 4.2 x 3.5 cm left renal inferior pole cyst. Bladder: Appears normal for degree of bladder distention. Other: Fatty infiltration of the visualized liver. Trace perihepatic fluid (image 17/52 and 4/52). IMPRESSION: 1. Mild parenchyma atrophy.  No hydronephrosis or shadowing stone. 2. Left renal inferior pole cyst. 3. Fatty liver 4. Probable trace perihepatic fluid. Electronically Signed   By: Anner Crete M.D.   On: 12/16/2019 21:14    Medications: Infusions: .  prismasol BGK 4/2.5 400 mL/hr at 12/16/19 1255  .  prismasol BGK 4/2.5 200 mL/hr at 12/16/19 1254  . sodium chloride 10 mL/hr at 12/17/19 0700  . amiodarone 30 mg/hr (12/17/19 0600)  . cefUROXime (ZINACEF)  IV Stopped (12/16/19 2154)  . feeding supplement (VITAL AF 1.2 CAL)    . phenylephrine (NEO-SYNEPHRINE) Adult infusion 5 mcg/min (12/17/19 0700)  . prismasol BGK 4/2.5 1,000 mL/hr at 12/16/19 2256    Scheduled Medications: . atorvastatin  10 mg Oral q1800  . B-complex with vitamin C  1 tablet Oral Daily  . chlorhexidine gluconate (MEDLINE KIT)  15 mL Mouth Rinse BID  . Chlorhexidine Gluconate Cloth  6 each Topical Daily  . dextrose  50 mL Intravenous Once  . feeding supplement (PRO-STAT SUGAR FREE 64)  30 mL Per Tube Daily  . free water  400 mL Per Tube Q4H  . heparin injection (subcutaneous)  5,000 Units Subcutaneous Q8H  . insulin aspart  0-15 Units Subcutaneous Q4H  . mouth rinse  15 mL Mouth Rinse 10 times per day  . pantoprazole (PROTONIX) IV  40 mg Intravenous Q12H  . sodium chloride flush  3 mL Intravenous Q12H    have reviewed scheduled and prn medications.  Physical Exam: General: Intubated, opens eyes. Heart:RRR, s1s2 nl Lungs: Coarse breath sound bilateral, Abdomen:soft, Non-tender, non-distended Extremities:No edema Dialysis Access: IJ temporary catheter.  Jeremy Parrish Jeremy Parrish 12/17/2019,7:31 AM  LOS: 8  days  Pager: [phone]

## 2019-12-17 NOTE — Progress Notes (Signed)
NAME:  Jeremy Parrish, MRN:  [id number], DOB:  [birth year], LOS: 8 ADMISSION DATE:  12/09/2019, CONSULTATION DATE:  12/09/18 REFERRING MD:  Bronson Ing  CHIEF COMPLAINT: Septic shock.  Brief History   Jeremy Parrish is a 79 y/o male, with a PMH of aortic aneurysm, depression, bladder cancer, multiple myeloma, who presented from [facility] on 1/28 with A. Fib with RVR. On 1/29 he developed shock and was intubated and placed levophed and dobutamine. He has been successfully weaned off vasopressors and sedation, but patient is still intubated due to encephalopathy at this time.  Patient started CRRT yesteray (2/4) to see if that improves his mental status and helps him liberate from the vent.  Ms. Guyette does not want him reinutbated if he fails extubation.  She also clarified CODE STATUS as full DNR. (see note from 2/4)  Heparin is currently held 2/2 thrombocytopenia - will need to restart if platelets increase.  Past Medical History   Past Medical History:  Diagnosis Date  . Anemia   . Aortic aneurysm of unspecified site without mention of rupture   . Arthritis   . Bladder neck contracture   . Cancer ([place] [place])   . Cerebral atherosclerosis    Carotid Doppler, 02/16/2013 - Bilateral Proximal ICAs,demonstrate mild plaque w/o evidence of significant diameter reduction, dissection, or any other vascular abnormality  . CHF (congestive heart failure) ([place])   . Complication of anesthesia   . COPD (chronic obstructive pulmonary disease) ([place])   . Coronary artery disease   . Depression   . Esophageal reflux   . Heart disease   . Heart murmur   . Hx of bladder cancer 10/07/2012  . Hyperlipidemia   . Hypertension   . 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 Intestinovesical fistula   . Kidney stones    history  . Lung mass   . Multiple myeloma   . Myocardial infarction [facility])    '96  . Non Hodgkin's lymphoma ([place])   . Paroxysmal atrial  fibrillation ([place]) 01/02/2016  . Peripheral arterial disease ([place])   . Personal history of other diseases of circulatory system   . PONV (postoperative nausea and vomiting)   . Prostate cancer ([place]) 2000  . Shingles   . Shortness of breath   . Sleep apnea    05-02-14 cpap , not yet used- suggested settings 5  . Stroke [facility]) 2013   Speech.     Significant Hospital Events   1/28 > Admission 1/29 > O/N Intubated 2/2 to cardiac shock. Pressures stabilizing.  2/1 > Successfully weaned off all vasopressors and sedation  Consults:  PCCM Nephrology HF   Procedures:  ETT 1/29 >  R radial art line 1/29 >  R IJ CVC 1/29 >   Significant Diagnostic Tests:  Echo 1/28 > EF 30-35%, mild LVH, mildly reduced RV systolic function.  Severely dilated LA, mod dilated RA.  Micro Data:  SARS CoV2 1/28 > neg. Flu 1/28 > neg. Blood 129 >> no growth at 2 days Respiratory 1/29 >> Moraxella catarrhalis, beta-lactamase positive >>   Antimicrobials:  Cefepime 1/29 >> 1/31 vanco 1/29 >> 1/31 Meropenem 1/31 >> 2/2 Cefuroxime 2/2 > 2/6 (full 7 day course)  Interim history/subjective:  Remains confused/sedated.  Overnight became hypotensive to 81/63, likely due to increased sedation (versed) for discomfort.  Phenylephrine was started, but switched to norepi this morning (titrated to MAP>65).  Currently restricted to low dose intermittent sedation.  Foley placed 2/2 urinary retention.  Objective:  Blood pressure 112/79, pulse 96, temperature (!) 96.7 F (35.9 C), temperature source Axillary, resp. rate (!) 36, height _0  (1.702 m), weight 80.1 kg, SpO2 92 %. CVP:  [0 mmHg-9 mmHg] 6 mmHg  Vent Mode: PSV;CPAP FiO2 (%):  [40 %-50 %] 50 % Set Rate:  [12 bmp] 12 bmp Vt Set:  [520 mL] 520 mL PEEP:  [5 cmH20] 5 cmH20 Pressure Support:  [5 cmH20] 5 cmH20 Plateau Pressure:  [11 cmH20-16 cmH20] 16 cmH20   Intake/Output Summary (Last 24 hours) at 12/17/2019 1002 Last data filed at 12/17/2019 1000 Gross per  24 hour  Intake 7006.46 ml  Output 1692 ml  Net 5314.46 ml   Filed Weights   12/15/19 0500 12/16/19 0500 12/17/19 0500  Weight: 77.7 kg 78.2 kg 80.1 kg    Examination: General: Intubated, awake Neuro: Responds to voice/commands intermittently, still confused HEENT: ET tube in good position Cardiovascular: regular rate, mildly irregular rhythm, no murmurs, rubs appreciated Lungs: ronchi bilaterally Abdomen: Nondistended, soft Musculoskeletal: No deformities, cyanosis, or edema Skin: no rashes  Assessment & Plan:   Metabolic Encephalopathy / Hypoactive delirium: Sedation stopped 2/1, patient still encephalopathic.  Concern for sundowning as patient tends to get more agitated and restless at night.   -see renal notes below -delirium precautions - lights and TV on during the day, sit patient up in bed -extubate  Acute renal failure due to shock, ATN, hypoperfusion state.  Patient is currently on time-limited trial of CRRT (~48 hrs) to see if that improves his mental status and helps him liberate from the vent.  Ms. Pindell does not want him reinutbated if he fails extubation.  She also clarified CODE STATUS as full DNR. (see note from 2/4) -renal consult, appreciate recs -follow fluid recs per nephrology (FWF 400 q4) -renal function panel q4 hours -continue to follow urine output, BMP -renally dose medications  Hypotension -norepinephrine titrated to MAP > 65  Moraxella pneumonia -cont cefuroxime for full 7 day course (last dose on 2/6)  A. Fib with RVR: Patient with PMH of A Fib.  Found to have LVEF of 30-35%, with a RV dysfunction from 40-45% (11/2018).  Some NSVT overnight (2/3-2/4).   -IV amiodarone -keep K+ >4, Mg+ >2 per cardiology -Anticoagulation currently on hold -Ultimately will likely require right and left heart catheterization, repeat echocardiogram to assess LV function improvement (from ?  Septic component cardiomyopathy)  Thrombocytopenia.  Platelets decreased  from 133 on admission (1/28) to 43 today (2/5).  Suspect due to sepsis and multiple myeloma.  HIT panel reassuring.  DIC panel 2/4 showed INR of 1.7, PTT of 80, nL fibrinogen, D-dimer of 2.67, and no schistocytes on smear. -d/c heparin ppx for now  Multiple Myeloma: Lenalidomide held, on IVIG as an outpatient -Following CBC  Transaminitis, improving -Continue to follow LFT intermittently  Multifactorial shock, cardiogenic and septic with associated Multi Organ Failure due to Moraxella pneumonia. -resolved  AG Metabolic Acidosis, due to lactic acidosis, acute renal failure.  -resolved  Best Practice:  Diet: NPO. Pain/Anxiety/Delirium protocol (if indicated): fentanyl gtt / midazolam gtt.  RASS goal -1. VAP protocol (if indicated): In place. DVT prophylaxis: SCD's /heparin subcutaneous GI prophylaxis: PPI. Glucose control: Sliding-scale insulin moderate scale Mobility: Bedrest. Code Status: Limited.  No CPR, no escalation.  Cardioversion or defib for acute arrhythmia acceptable Family Communication: discussed status and plans with his wife by phone 1/31.  Disposition: ICU.  Labs   CBC: Recent Labs  Lab 12/13/19 0419 12/13/19 0419 12/14/19 0405 12/15/19 0857 12/16/19 0444 12/16/19 1530 12/17/19 0344  WBC 7.8  --  9.9 10.2 8.0  --  7.7  HGB 10.4*  --  11.2* 12.3* 11.6*  --  11.4*  HCT 33.0*  --  35.6* 39.3 37.4*  --  36.7*  MCV 88.0  --  86.8 87.7 88.4  --  87.8  PLT 54*   < > 51* 50* 45* 40* 43*   < > = values in this interval not displayed.   Basic Metabolic Panel: Recent Labs  Lab 12/11/19 1622 12/12/19 0427 12/14/19 0405 12/14/19 1256 12/15/19 0133 12/15/19 1447 12/16/19 0036 12/16/19 0036 12/16/19 0444 12/16/19 1332 12/16/19 1530 12/16/19 2017 12/17/19 0343 12/17/19 0344  NA  --    < > 149*   < > 154*   < > 155*   < > 157* 153* 153* 151* 148*  --   K 3.9   < > 2.7*   < > 3.6   < > 3.1*   < > 4.4 2.9* 3.3* 3.5 4.1  --   CL  --    < > 110   < > 111   < >  112*   < > 117* 119* 113* 112* 111  --   CO2  --    < > 25   < > 27   < > 27   < > _0 --   GLUCOSE  --    < > 191*   < > 211*   < > 175*   < > 186* 162* 195* 177* 154*  --   BUN  --    < > 129*   < > 165*   < > 177*   < > 173* 144* 158* 140* 114*  --   CREATININE  --    < > 3.44*   < > 4.05*   < > 3.70*   < > 3.70* 2.83* 3.08* 2.72* 2.15*  --   CALCIUM  --    < > 7.3*   < > 8.9   < > 8.1*   < > 8.2* 6.8* 7.7* 7.8* 7.8*  --   MG 2.2  --  2.1  --  2.7*  --  2.5*  --   --   --   --   --   --  2.1  PHOS 4.4  --   --   --   --   --   --   --   --  4.3 4.9* 4.5 4.3  --    < > = values in this interval not displayed.   GFR: Estimated Creatinine Clearance: 29.2 mL/min (A) (by C-G formula based on SCr of 2.15 mg/dL (H)). Recent Labs  Lab 12/10/19 1128 12/11/19 0419 12/11/19 0708 12/11/19 1023 12/11/19 1935 12/14/19 0405 12/15/19 0857 12/16/19 0444 12/17/19 0344  WBC  --    < >  --   --    < > 9.9 10.2 8.0 7.7  LATICACIDVEN 9.8*  --  3.2* 3.1*  --   --   --   --   --    < > = values in this interval not displayed.   Liver Function Tests: Recent Labs  Lab 12/11/19 0708 12/11/19 0708 12/12/19 2263 12/12/19 0427 12/13/19 0419 12/13/19 0419 12/15/19 0133 12/16/19 1332 12/16/19 1530 12/16/19 2017 12/17/19 0343  AST 3,995*  --  1,047*  --  223*  --  71*  --   --   --   --   ALT 3,197*  --  1,927*  --  1,175*  --  551*  --   --   --   --   ALKPHOS 70  --  74  --  87  --  115  --   --   --   --   BILITOT 3.7*  --  2.9*  --  2.0*  --  1.5*  --   --   --   --   PROT 5.4*  --  4.9*  --  4.8*  --  5.3*  --   --   --   --   ALBUMIN 2.5*   < > 2.2*   < > 2.1*   < > 2.1* 1.4* 1.8* 1.9* 1.8*   < > = values in this interval not displayed.   No results for input(s): LIPASE, AMYLASE in the last 168 hours. No results for input(s): AMMONIA in the last 168 hours. ABG    Component Value Date/Time   PHART 7.628 (HH) 12/11/2019 0707   PCO2ART 34.5 12/11/2019 0707   PO2ART 68.0 (L)  12/11/2019 0707   HCO3 36.1 (H) 12/11/2019 0707   TCO2 37 (H) 12/11/2019 0707   ACIDBASEDEF 19.0 (H) 12/10/2019 0419   O2SAT 50.7 12/17/2019 0627    Coagulation Profile: Recent Labs  Lab 12/16/19 1530  INR 1.7*   Cardiac Enzymes: No results for input(s): CKTOTAL, CKMB, CKMBINDEX, TROPONINI in the last 168 hours. HbA1C: Hgb A1c MFr Bld  Date/Time Value Ref Range Status  12/10/2019 09:07 PM 5.6 4.8 - 5.6 % Final    Comment:    (NOTE) Pre diabetes:          5.7%-6.4% Diabetes:              >6.4% Glycemic control for   <7.0% adults with diabetes   01/08/2019 10:57 AM 5.6 4.8 - 5.6 % Final    Comment:    (NOTE) Pre diabetes:          5.7%-6.4% Diabetes:              >6.4% Glycemic control for   <7.0% adults with diabetes    CBG: Recent Labs  Lab 12/16/19 1956 12/16/19 2022 12/16/19 2335 12/17/19 0318 12/17/19 0743  GLUCAP 153* 143* 151* 132* 131*    CRITICAL CARE Performed by: Mallie Darting,  MS4   Total critical care time: 40 minutes  Critical care time was exclusive of separately billable procedures and treating other patients.  Critical care was necessary to treat or prevent imminent or life-threatening deterioration.  Critical care was time spent personally by me on the following activities: development of treatment plan with patient and/or surrogate as well as nursing, discussions with consultants, evaluation of patient's response to treatment, examination of patient, obtaining history from patient or surrogate, ordering and performing treatments and interventions, ordering and review of laboratory studies, ordering and review of radiographic studies, pulse oximetry, re-evaluation of patient's condition and participation in multidisciplinary rounds.

## 2019-12-17 NOTE — Progress Notes (Signed)
CRRT machine filter pressures drastically increased from 170's at start of shift to 380's now. CRRT stopped and blood returned to patient. Dr. Carolin Sicks notified who stated it was ok to stop for now and will reevaluate after 4pm labs.   Joellen Jersey, RN

## 2019-12-18 LAB — GLUCOSE, CAPILLARY
Glucose-Capillary: 137 mg/dL — ABNORMAL HIGH (ref 70–99)
Glucose-Capillary: 156 mg/dL — ABNORMAL HIGH (ref 70–99)
Glucose-Capillary: 160 mg/dL — ABNORMAL HIGH (ref 70–99)
Glucose-Capillary: 162 mg/dL — ABNORMAL HIGH (ref 70–99)
Glucose-Capillary: 163 mg/dL — ABNORMAL HIGH (ref 70–99)
Glucose-Capillary: 171 mg/dL — ABNORMAL HIGH (ref 70–99)
Glucose-Capillary: 220 mg/dL — ABNORMAL HIGH (ref 70–99)
Glucose-Capillary: 256 mg/dL — ABNORMAL HIGH (ref 70–99)

## 2019-12-18 LAB — CBC
HCT: 39.9 % (ref 39.0–52.0)
Hemoglobin: 12.6 g/dL — ABNORMAL LOW (ref 13.0–17.0)
MCH: 27.1 pg (ref 26.0–34.0)
MCHC: 31.6 g/dL (ref 30.0–36.0)
MCV: 85.8 fL (ref 80.0–100.0)
Platelets: 48 10*3/uL — ABNORMAL LOW (ref 150–400)
RBC: 4.65 MIL/uL (ref 4.22–5.81)
RDW: 21.2 % — ABNORMAL HIGH (ref 11.5–15.5)
WBC: 8.7 10*3/uL (ref 4.0–10.5)
nRBC: 0 % (ref 0.0–0.2)

## 2019-12-18 LAB — RENAL FUNCTION PANEL
Albumin: 1.9 g/dL — ABNORMAL LOW (ref 3.5–5.0)
Albumin: 1.8 g/dL — ABNORMAL LOW (ref 3.5–5.0)
Anion gap: 15 (ref 5–15)
Anion gap: 16 — ABNORMAL HIGH (ref 5–15)
BUN: 121 mg/dL — ABNORMAL HIGH (ref 8–23)
BUN: 131 mg/dL — ABNORMAL HIGH (ref 8–23)
CO2: 24 mmol/L (ref 22–32)
CO2: 21 mmol/L — ABNORMAL LOW (ref 22–32)
Calcium: 8.2 mg/dL — ABNORMAL LOW (ref 8.9–10.3)
Calcium: 8 mg/dL — ABNORMAL LOW (ref 8.9–10.3)
Chloride: 108 mmol/L (ref 98–111)
Chloride: 106 mmol/L (ref 98–111)
Creatinine, Ser: 2.52 mg/dL — ABNORMAL HIGH (ref 0.61–1.24)
Creatinine, Ser: 2.76 mg/dL — ABNORMAL HIGH (ref 0.61–1.24)
GFR calc Af Amer: 27 mL/min — ABNORMAL LOW (ref >60)
GFR calc Af Amer: 25 mL/min — ABNORMAL LOW (ref >60)
GFR calc non Af Amer: 24 mL/min — ABNORMAL LOW (ref >60)
GFR calc non Af Amer: 21 mL/min — ABNORMAL LOW (ref >60)
Glucose, Bld: 201 mg/dL — ABNORMAL HIGH (ref 70–99)
Glucose, Bld: 284 mg/dL — ABNORMAL HIGH (ref 70–99)
Phosphorus: 6.9 mg/dL — ABNORMAL HIGH (ref 2.5–4.6)
Phosphorus: 7.3 mg/dL — ABNORMAL HIGH (ref 2.5–4.6)
Potassium: 4.5 mmol/L (ref 3.5–5.1)
Potassium: 4 mmol/L (ref 3.5–5.1)
Sodium: 147 mmol/L — ABNORMAL HIGH (ref 135–145)
Sodium: 143 mmol/L (ref 135–145)

## 2019-12-18 LAB — BASIC METABOLIC PANEL
Anion gap: 13 (ref 5–15)
BUN: 117 mg/dL — ABNORMAL HIGH (ref 8–23)
CO2: 24 mmol/L (ref 22–32)
Calcium: 7.8 mg/dL — ABNORMAL LOW (ref 8.9–10.3)
Chloride: 111 mmol/L (ref 98–111)
Creatinine, Ser: 2.43 mg/dL — ABNORMAL HIGH (ref 0.61–1.24)
GFR calc Af Amer: 29 mL/min — ABNORMAL LOW (ref >60)
GFR calc non Af Amer: 25 mL/min — ABNORMAL LOW (ref >60)
Glucose, Bld: 194 mg/dL — ABNORMAL HIGH (ref 70–99)
Potassium: 4.5 mmol/L (ref 3.5–5.1)
Sodium: 148 mmol/L — ABNORMAL HIGH (ref 135–145)

## 2019-12-18 LAB — COOXEMETRY PANEL
Carboxyhemoglobin: 1.2 % (ref 0.5–1.5)
Methemoglobin: 0.9 % (ref 0.0–1.5)
O2 Saturation: 50.3 %
Total hemoglobin: 12.9 g/dL (ref 12.0–16.0)

## 2019-12-18 LAB — MAGNESIUM: Magnesium: 2.4 mg/dL (ref 1.7–2.4)

## 2019-12-18 MED ORDER — SODIUM CHLORIDE 0.9 % IV SOLN
1.5000 g | Freq: Three times a day (TID) | INTRAVENOUS | Status: DC
  Administered 2019-12-18 – 2019-12-19 (×3): 1.5 g via INTRAVENOUS
  Filled 2019-12-18 (×5): qty 1.5

## 2019-12-18 NOTE — Progress Notes (Signed)
Advanced Heart Failure Team Rounding Note   Primary Physician: Celene Squibb, MD PCP-Cardiologist:  Sanda Klein, MD   Requesting: Dr. Jacinta Shoe    HPI:    Extubated 2/5.   Awake but agitated. Says he hurts all over. Hard to communicate due to h/o severe apahsia.   Off CVVHD currently. Making urine. Creatinine stable 2.5 -> 2.4 BUN still high (117) Had urinary retention and Foley remains in place   In NE 1. Co-ox 50%  Respiratory Cx- morcella catarrhhalis--> was on meropenum but on  2/3 switched to zinacef.   PVCs/NSVT improved with IV amio.  [place] discussions ongoing  Past Medical History: Past Medical History:  Diagnosis Date  . Anemia   . Aortic aneurysm of unspecified site without mention of rupture   . Arthritis   . Bladder neck contracture   . Cancer ([place])   . Cerebral atherosclerosis    Carotid Doppler, 02/16/2013 - Bilateral Proximal ICAs,demonstrate mild plaque w/o evidence of significant diameter reduction, dissection, or any other vascular abnormality  . CHF (congestive heart failure) ([place])   . Complication of anesthesia   . COPD (chronic obstructive pulmonary disease) ([place])   . Coronary artery disease   . Depression   . Esophageal reflux   . Heart disease   . Heart murmur   . Hx of bladder cancer 10/07/2012  . Hyperlipidemia   . Hypertension   . 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 Intestinovesical fistula   . Kidney stones    history  . Lung mass   . Multiple myeloma   . Myocardial infarction [facility])    '96  . Non Hodgkin's lymphoma ([place])   . Paroxysmal atrial fibrillation ([place]) 01/02/2016  . Peripheral arterial disease ([place])   . Personal history of other diseases of circulatory system   . PONV (postoperative nausea and vomiting)   . Prostate cancer ([place]) 2000  . Shingles   . Shortness of breath   . Sleep apnea    05-02-14 cpap , not yet used- suggested settings 5  .  Stroke [facility]) 2013   Speech.    Past Surgical History: Past Surgical History:  Procedure Laterality Date  . BLADDER SURGERY    . BONE MARROW TRANSPLANT  2011  . CARDIOVERSION N/A 06/02/2019   Procedure: CARDIOVERSION;  Surgeon: Sanda Klein, MD;  [facility];  Service: Cardiovascular;  Laterality: N/A;  . COLON SURGERY     colon resection  . COLONOSCOPY N/A 01/01/2013   Procedure: COLONOSCOPY;  Surgeon: Rogene Houston, MD;  [facility] ENDO [facility];  Service: Endoscopy;  Laterality: N/A;  825-moved to [place] notified pt  . COLONOSCOPY N/A 07/16/2018   Procedure: COLONOSCOPY;  Surgeon: Rogene Houston, MD;  Location[facility] END[facility];  Service: Endoscopy;  Laterality: N/A;  1:25  . CORONARY ANGIOPLASTY  06/24/2000   PCI and stenting in mid & proximal RCA  . heart stents x 5  1999  . INGUINAL HERNIA REPAIR Right 05/04/2014   Procedure: OPEN RIGHT INGUINAL HERNIA REPAIR with mesh;  Surgeon: Edward Jolly, MD;  Location: [facility] ORS;  Service: General;  Laterality: Right;  . INSERT / REPLACE / REMOVE PACEMAKER    . left ear skin cancer removed    . NM MYOCAR PERF WALL MOTION  11/27/2007   inferior scar  . PACEMAKER INSERTION  07/22/2011   [employer]  . POLYPECTOMY  07/16/2018   Procedure: POLYPECTOMY;  Surgeon: Rogene Houston, MD;  [facility];  Service: Endoscopy;;  colon  . PORTACATH PLACEMENT  07/26/2009   right chest  . PROSTATE SURGERY    . Rotator    . ROTATOR CUFF REPAIR Right   . SHOULDER ARTHROSCOPY WITH SUBACROMIAL DECOMPRESSION Right 07/21/2013   Procedure: RIGHT SHOULDER ARTHROSCOPY WITH SUBACROMIAL DECOMPRESSION AND DEBRIDEMENT & Injection of Left Shoulder;  Surgeon: Alta Corning, MD;  Location: [place];  Service: Orthopedics;  Laterality: Right;  . TEE WITHOUT CARDIOVERSION  10/13/2012   Procedure: TRANSESOPHAGEAL ECHOCARDIOGRAM (TEE);  Surgeon: Sanda Klein, MD;  Location: [facility] ENDOSCOPY;  Service: Cardiovascular;  Laterality: N/A;  pat/kay/echo notified   . US ECHOCARDIOGRAPHY  06/19/2011   RV mildly dilated,mild to mod. MR,mild AI,mild PI  . WRIST SURGERY     right    Family History: Family History  Problem Relation Age of Onset  . Cancer Father        bladder  . Heart disease Father        before age 54  . Hypertension Mother   . Cancer Brother   . Heart disease Brother        before age 53  . Heart disease Sister        before age 13  . Hyperlipidemia Sister   . Hypertension Sister   . Heart attack Sister   . Colon cancer Neg Hx   . Colon polyps Neg Hx     Social History: Social History   Socioeconomic History  . Marital status: Married    Spouse name: [name]  . Number of children: 3  . Years of education: 9th  . Highest education level: Not on file  Occupational History  . Occupation: retired   Tobacco Use  . Smoking status: Former Smoker    Packs/day: 1.00    Years: 20.00    Pack years: 20.00    Types: Cigarettes    Quit date: 11/14/1994    Years since quitting: 25.1  . Smokeless tobacco: Never Used  Substance and Sexual Activity  . Alcohol use: No    Alcohol/week: 0.0 standard drinks    Comment: previously drank but none for at least 15 years.  . Drug use: No  . Sexual activity: Not on file  Other Topics Concern  . Not on file  Social History Narrative   Patient lives at home spouse.   Caffeine Use: Occasionally   Social Determinants of Health   Financial Resource Strain:   . Difficulty of Paying Living Expenses: Not on file  Food Insecurity:   . Worried About Charity fundraiser in the Last Year: Not on file  . Ran Out of Food in the Last Year: Not on file  Transportation Needs:   . Lack of Transportation (Medical): Not on file  . Lack of Transportation (Non-Medical): Not on file  Physical Activity:   . Days of Exercise per Week: Not on file  . Minutes of Exercise per Session: Not on file  Stress:   . Feeling of Stress : Not on file  Social Connections:   . Frequency of Communication with  Friends and Family: Not on file  . Frequency of Social Gatherings with Friends and Family: Not on file  . Attends Religious Services: Not on file  . Active Member of Clubs or Organizations: Not on file  . Attends Archivist Meetings: Not on file  . Marital Status: Not on file    Allergies:  Allergies  Allergen Reactions  . Morphine And Related Other (See Comments)    hallucinations  . Tape Rash    Paper tape is ok    Objective:    Vital Signs:   Temp:  [96 F (35.6 C)-97.8 F (36.6 C)] 97.6 F (36.4 C) (02/06 0800) Pulse Rate:  [65-96] 81 (02/06 0800) Resp:  [17-66] 27 (02/06 0800) BP: (90-123)/(62-98) 99/78 (02/06 0800) SpO2:  [89 %-100 %] 97 % (02/06 0800) Weight:  [79 kg] 79 kg (02/06 0500) Last BM Date: 12/18/19  Weight change: Filed Weights   12/16/19 0500 12/17/19 0500 12/18/19 0500  Weight: 78.2 kg 80.1 kg 79 kg    Intake/Output:   Intake/Output Summary (Last 24 hours) at 12/18/2019 0903 Last data filed at 12/18/2019 0800 Gross per 24 hour  Intake 890.94 ml  Output 1435 ml  Net -544.06 ml      Physical Exam   General:  Awake agitated Can communicate some Appears uncomfortable  HEENT: normal Neck: supple. no JVD. Carotids 2+ bilat; no bruits. No lymphadenopathy or thryomegaly appreciated. Cor: PMI nondisplaced. Irregular rate & rhythm. No rubs, gallops or murmurs. Lungs: clear Abdomen: soft, nontender, nondistended. No hepatosplenomegaly. No bruits or masses. Good bowel sounds. Extremities: no cyanosis, clubbing, rash, edema + right femoral HD cath Neuro: awake agitate. Moves all 4  Has expressive aphasia  Telemetry   A fib with PVCs  80s Personally reviewed   Labs   Basic Metabolic Panel: Recent Labs  Lab 12/14/19 0405 12/14/19 1256 12/15/19 0133 12/15/19 1447 12/16/19 0036 12/16/19 0444 12/16/19 1530 12/16/19 1530 12/16/19 2017 12/16/19 2017 12/17/19 0343 12/17/19 0344 12/17/19 1617 12/18/19 0406  NA 149*   < > 154*   <  > 155*   < > 153*  --  151*  --  148*  --  143 148*  147*  K 2.7*   < > 3.6   < > 3.1*   < > 3.3*  --  3.5  --  4.1  --  4.1 4.5  4.5  CL 110   < > 111   < > 112*   < > 113*  --  112*  --  111  --  107 111  108  CO2 25   < > 27   < > 27   < > 27  --  26  --  26  --  25 24  24   GLUCOSE 191*   < > 211*   < > 175*   < > 195*  --  177*  --  154*  --  130* 194*  201*  BUN 129*   < > 165*   < > 177*   < > 158*  --  140*  --  114*  --  105* 117*  121*  CREATININE 3.44*   < > 4.05*   < > 3.70*   < > 3.08*  --  2.72*  --  2.15*  --  2.13* 2.43*  2.52*  CALCIUM 7.3*   < > 8.9   < > 8.1*   < > 7.7*   < > 7.8*   < > 7.8*  --  7.8* 7.8*  8.2*  MG 2.1  --  2.7*  --  2.5*  --   --   --   --   --   --  2.1  --  2.4  PHOS  --   --   --   --   --    < >  4.9*  --  4.5  --  4.3  --  5.4* 6.9*   < > = values in this interval not displayed.    Liver Function Tests: Recent Labs  Lab 12/12/19 0427 12/12/19 0427 12/13/19 0419 12/13/19 0419 12/15/19 0133 12/16/19 1332 12/16/19 1530 12/16/19 2017 12/17/19 0343 12/17/19 1617 12/18/19 0406  AST 1,047*  --  223*  --  71*  --   --   --   --   --   --   ALT 1,927*  --  1,175*  --  551*  --   --   --   --   --   --   ALKPHOS 74  --  87  --  115  --   --   --   --   --   --   BILITOT 2.9*  --  2.0*  --  1.5*  --   --   --   --   --   --   PROT 4.9*  --  4.8*  --  5.3*  --   --   --   --   --   --   ALBUMIN 2.2*   < > 2.1*   < > 2.1*   < > 1.8* 1.9* 1.8* 1.9* 1.9*   < > = values in this interval not displayed.   No results for input(s): LIPASE, AMYLASE in the last 168 hours. No results for input(s): AMMONIA in the last 168 hours.  CBC: Recent Labs  Lab 12/14/19 0405 12/14/19 0405 12/15/19 0857 12/16/19 0444 12/16/19 1530 12/17/19 0344 12/18/19 0406  WBC 9.9  --  10.2 8.0  --  7.7 8.7  HGB 11.2*  --  12.3* 11.6*  --  11.4* 12.6*  HCT 35.6*  --  39.3 37.4*  --  36.7* 39.9  MCV 86.8  --  87.7 88.4  --  87.8 85.8  PLT 51*   < > 50* 45* 40* 43*  48*   < > = values in this interval not displayed.    Cardiac Enzymes: No results for input(s): CKTOTAL, CKMB, CKMBINDEX, TROPONINI in the last 168 hours.  BNP: BNP (last 3 results) Recent Labs    12/09/19 0130  BNP 2,803.0*    ProBNP (last 3 results) No results for input(s): PROBNP in the last 8760 hours.   CBG: Recent Labs  Lab 12/17/19 1614 12/17/19 2038 12/18/19 0036 12/18/19 0358 12/18/19 0740  GLUCAP 108* 165* 171* 156* 163*    Coagulation Studies: Recent Labs    12/16/19 1530  LABPROT 19.5*  INR 1.7*     Imaging   No results found.   Medications:     Current Medications: . atorvastatin  10 mg Oral q1800  . B-complex with vitamin C  1 tablet Oral Daily  . chlorhexidine gluconate (MEDLINE KIT)  15 mL Mouth Rinse BID  . Chlorhexidine Gluconate Cloth  6 each Topical Daily  . dextrose  50 mL Intravenous Once  . feeding supplement (PRO-STAT SUGAR FREE 64)  30 mL Per Tube Daily  . free water  400 mL Per Tube Q4H  . insulin aspart  0-15 Units Subcutaneous Q4H  . mouth rinse  15 mL Mouth Rinse 10 times per day  . pantoprazole (PROTONIX) IV  40 mg Intravenous Q12H  . sodium chloride flush  3 mL Intravenous Q12H    Infusions: .  prismasol BGK 4/2.5 400 mL/hr at 12/16/19 1255  .  prismasol BGK 4/2.5  200 mL/hr at 12/16/19 1254  . sodium chloride Stopped (12/18/19 0031)  . amiodarone 30 mg/hr (12/18/19 0800)  . cefUROXime (ZINACEF)  IV    . feeding supplement (VITAL AF 1.2 CAL) Stopped (12/17/19 1045)  . norepinephrine (LEVOPHED) Adult infusion 1 mcg/min (12/18/19 0800)  . prismasol BGK 4/2.5 1,000 mL/hr at 12/17/19 0804    Assessment/Plan   1. Cardiogenic shock/ Septic Shock - EF 30-35% by echo (newly down) - Unclear etiology ? AF vs ischemia.  Compounded by IV diltiazem  - Blood CX 1/29 - No growth  - Respiratory Cx 1/29- moraxella - was on meropenum stopped 2/3 and switched to zinacef.  - Remains on low-dose NE. Co-ox low at 50%. C/w with low  output  2. Acute systolic HF - management as above - ECHO 30-35%.  - Hold off on bb, arb, spiro for now.  - Back on pressors. Co-ox low at 50% - On low-dose NE. Can increase as needed - Volume status ok  - if recovers will need R/L cath but given AKI likely defer  -Has multi-system organ failure in setting of underlying multiple myeloma. Shock is mixed picture but cardiac output remains low with newly reduced EF. Remains agitated but assessment complicated by underlying severe expressive aphasia. Family meeting pending to determine how aggressive to be with care. Can consider increasing inotropic support but not sure that this will greatly affect short-term prognosis. Not candidate for advanced HF therapies   3. Acute hypoxic respiratory failure - Extubated 2/5 - CCM on board  4. CAD - s/p previous PCIs - hs trop 30 -> 43 -> 43. Not c/w ACS. No change  5. AKI - Creatinine 1.3-> 2.1->3.77->3.44>4.05>>3.8>3.7 -> CVVHD -> 2.4 - BUN 173 -> 121 -> 117 - Due to ATN from shock - CVVHD started 2/4. On hold now, Making urine - Renal following    6. Chronic A fib  - On on amio drip with NSVT and frequent PVCs  - On subcutaneous heparin and not IV heparin  - Platelets trending down to 40k - If platelets recover can start heparin   7. Multiple myeloma - treated at [facility]  8. Dementia - unclear how severe this is  9. Thrombocytopenia - Platelets 133 on admit.  - Platelets trending down to 54>51>50>45-> 43 -> 48 - HIT negative  10. Hypernatremia - Improving with CVVHD - Receiving free water   11. NSVT - Frequent PVCs and NSVT overnight.  - Improved with IV amio. Continue for now.  - Keep K>4.0 Mg > 2.0   Has multi-system organ failure in setting of underlying multiple myeloma. Shock is mixed picture but cardiac output remains low with newly reduced EF. Remains agitated but assessment complicated by underlying severe expressive aphasia. Family meeting pending to determine how  aggressive to be with care. Can consider increasing inotropic support but not sure that this will greatly affect short-term prognosis.   CRITICAL CARE Performed by: Glori Bickers  Total critical care time: 35 minutes  Critical care time was exclusive of separately billable procedures and treating other patients.  Critical care was necessary to treat or prevent imminent or life-threatening deterioration.  Critical care was time spent personally by me (independent of midlevel providers or residents) on the following activities: development of treatment plan with patient and/or surrogate as well as nursing, discussions with consultants, evaluation of patient's response to treatment, examination of patient, obtaining history from patient or surrogate, ordering and performing treatments and interventions, ordering and review of laboratory  studies, ordering and review of radiographic studies, pulse oximetry and re-evaluation of patient's condition.   Length of Stay: 9  Glori Bickers, MD  12/18/2019, 9:03 AM  Advanced Heart Failure Team Pager [phone] (M-F; 7a - 4p)  Please contact [facility] for night-coverage after hours (4p -7a ) and weekends on [email or web address]

## 2019-12-18 NOTE — Evaluation (Signed)
Clinical/Bedside Swallow Evaluation Patient Details  Name: Jeremy Parrish MRN: [id number] Date of Birth: 06-12-1941  Today's Date: 12/18/2019 Time: SLP Start Time (ACUTE ONLY): 5498 SLP Stop Time (ACUTE ONLY): 1230 SLP Time Calculation (min) (ACUTE ONLY): 25 min  Past Medical History:  Past Medical History:  Diagnosis Date  . Anemia   . Aortic aneurysm of unspecified site without mention of rupture   . Arthritis   . Bladder neck contracture   . Cancer ([place])   . Cerebral atherosclerosis    Carotid Doppler, 02/16/2013 - Bilateral Proximal ICAs,demonstrate mild plaque w/o evidence of significant diameter reduction, dissection, or any other vascular abnormality  . CHF (congestive heart failure) ([place])   . Complication of anesthesia   . COPD (chronic obstructive pulmonary disease) ([place])   . Coronary artery disease   . Depression   . Esophageal reflux   . Heart disease   . Heart murmur   . Hx of bladder cancer 10/07/2012  . Hyperlipidemia   . Hypertension   . 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 Intestinovesical fistula   . Kidney stones    history  . Lung mass   . Multiple myeloma   . Myocardial infarction [facility])    '96  . Non Hodgkin's lymphoma ([place])   . Paroxysmal atrial fibrillation ([place]) 01/02/2016  . Peripheral arterial disease ([place])   . Personal history of other diseases of circulatory system   . PONV (postoperative nausea and vomiting)   . Prostate cancer ([place]) 2000  . Shingles   . Shortness of breath   . Sleep apnea    05-02-14 cpap , not yet used- suggested settings 5  . Stroke [facility]) 2013   Speech.   Past Surgical History:  Past Surgical History:  Procedure Laterality Date  . BLADDER SURGERY    . BONE MARROW TRANSPLANT  2011  . CARDIOVERSION N/A 06/02/2019   Procedure: CARDIOVERSION;  Surgeon: Sanda Klein, MD;  [facility];  Service: Cardiovascular;  Laterality: N/A;  . COLON  SURGERY     colon resection  . COLONOSCOPY N/A 01/01/2013   Procedure: COLONOSCOPY;  Surgeon: Rogene Houston, MD;  [facility];  Service: Endoscopy;  Laterality: N/A;  825-moved to [place] notified pt  . COLONOSCOPY N/A 07/16/2018   Procedure: COLONOSCOPY;  Surgeon: Rogene Houston, MD;  [facility];  Service: Endoscopy;  Laterality: N/A;  1:25  . CORONARY ANGIOPLASTY  06/24/2000   PCI and stenting in mid & proximal RCA  . heart stents x 5  1999  . INGUINAL HERNIA REPAIR Right 05/04/2014   Procedure: OPEN RIGHT INGUINAL HERNIA REPAIR with mesh;  Surgeon: Edward Jolly, MD;  [facility];  Service: General;  Laterality: Right;  . INSERT / REPLACE / REMOVE PACEMAKER    . left ear skin cancer removed    . NM MYOCAR PERF WALL MOTION  11/27/2007   inferior scar  . PACEMAKER INSERTION  07/22/2011   [employer]  . POLYPECTOMY  07/16/2018   Procedure: POLYPECTOMY;  Surgeon: Rogene Houston, MD;  [facility];  Service: Endoscopy;;  colon  . PORTACATH PLACEMENT  07/26/2009   right chest  . PROSTATE SURGERY    . Rotator    . ROTATOR CUFF REPAIR Right   . SHOULDER ARTHROSCOPY WITH SUBACROMIAL DECOMPRESSION Right 07/21/2013   Procedure: RIGHT SHOULDER ARTHROSCOPY WITH SUBACROMIAL DECOMPRESSION AND DEBRIDEMENT & Injection of Left Shoulder;  Surgeon: Alta Corning, MD;  Location: [place];  Service: Orthopedics;  Laterality: Right;  . TEE WITHOUT CARDIOVERSION  10/13/2012   Procedure: TRANSESOPHAGEAL ECHOCARDIOGRAM (TEE);  Surgeon: Sanda Klein, MD;  Location: [facility] ENDOSCOPY;  Service: Cardiovascular;  Laterality: N/A;  pat/kay/echo notified  . US ECHOCARDIOGRAPHY  06/19/2011   RV mildly dilated,mild to mod. MR,mild AI,mild PI  . WRIST SURGERY     right   HPI:  79 y/o male, with a PMH of aortic aneurysm, depression, bladder cancer, multiple myeloma, 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 Pt intubated  1/29-2/5. CRRT 2/4-2/5.   Assessment / Plan / Recommendation Clinical Impression  Patient presents with a mild oropharyngeal dysphagia characterized by delayed oral manipulation and transit of solids (puree and regular), delayed swallow initiation with coughing after thin liquids. Nectar thick liquids did not result in overt s/s of aspiration or penetration. SLP Visit Diagnosis: Dysphagia, unspecified (R13.10)    Aspiration Risk  Mild aspiration risk    Diet Recommendation Dysphagia 2 (Fine chop);Nectar-thick liquid   Liquid Administration via: Cup;Straw Medication Administration: Whole meds with puree Supervision: Staff to assist with self feeding;Full supervision/cueing for compensatory strategies Compensations: Minimize environmental distractions;Slow rate;Small sips/bites Postural Changes: Seated upright at 90 degrees    Other  Recommendations Oral Care Recommendations: Oral care BID Other Recommendations: Prohibited food (jello, ice cream, thin soups);Remove water pitcher;Have oral suction available;Clarify dietary restrictions;Order thickener from pharmacy   Follow up Recommendations Skilled Nursing facility;Home health SLP      Frequency and Duration min 2x/week  1 week       Prognosis Prognosis for Safe Diet Advancement: Good      Swallow Study   General Date of Onset: 12/09/19 HPI: 79 y/o male, with a PMH of aortic aneurysm, depression, bladder cancer, multiple myeloma, 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 Pt intubated 1/29-2/5. CRRT 2/4-2/5. Type of Study: Bedside Swallow Evaluation Previous Swallow Assessment: MBS in 2019 Diet Prior to this Study: NPO Temperature Spikes Noted: No History of Recent Intubation: Yes Length of Intubations (days): 8 days Date extubated: 12/17/19 Behavior/Cognition: Alert;Cooperative;Confused;Requires cueing Oral Cavity Assessment: Within Functional Limits Oral Care  Completed by SLP: Yes Oral Cavity - Dentition: Adequate natural dentition Self-Feeding Abilities: Needs assist;Needs set up Patient Positioning: Upright in bed;Postural control adequate for testing Baseline Vocal Quality: Low vocal intensity;Normal Volitional Cough: Cognitively unable to elicit Volitional Swallow: Unable to elicit    Oral/Motor/Sensory Function Overall Oral Motor/Sensory Function: Within functional limits   Ice Chips Ice chips: Impaired Oral Phase Impairments: Poor awareness of bolus;Reduced lingual movement/coordination   Thin Liquid Thin Liquid: Impaired Presentation: Straw;Cup Pharyngeal  Phase Impairments: Suspected delayed Swallow;Cough - Delayed;Throat Clearing - Delayed Other Comments: No overt s/s of aspiration or penetration observed    Nectar Thick Nectar Thick Liquid: Within functional limits Presentation: Straw;Cup   Honey Thick     Puree Puree: Within functional limits   Solid     Solid: Impaired Oral Phase Impairments: Impaired mastication      Dannial Monarch 12/18/2019,5:56 PM    Sonia Baller, MA, CCC-SLP Speech Therapy [facility] Acute Rehab Pager: [phone]

## 2019-12-18 NOTE — Evaluation (Signed)
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 Pt intubated 1/29-2/5. CRRT 2/4-2/5.    Clinical Impression  Pt admitted with above diagnosis. PTA pt lived at home with his wife, independent. On eval, he required max assist bed mobility. He sat EOB x 6 minutes min/mod assist. He presents with deficits in strength, balance, and activity tolerance. Pt on 6L O2 with SpO2 > 90% during mobility. Pt with h/o aphasia making communication difficult. He is following simple commands consistently.  Pt currently with functional limitations due to the deficits listed below (see PT Problem List). Pt will benefit from skilled PT to increase their independence and safety with mobility to allow discharge to the venue listed below.       Follow Up Recommendations CIR    Equipment Recommendations  Other (comment)(defer to next venue)    Recommendations for Other Services       Precautions / Restrictions Precautions Precautions: Fall;Other (comment) Precaution Comments: central line, rectal tube      Mobility  Bed Mobility Overal bed mobility: Needs Assistance Bed Mobility: Supine to Sit;Sit to Supine     Supine to sit: Max assist;HOB elevated Sit to supine: Max assist;HOB elevated   General bed mobility comments: assist with BLE off bed and to elevate trunk, cues for sequencing. +2 total assist to scoot up in bed using bed pad.  Transfers                 General transfer comment: unable to progress beyond EOB  Ambulation/Gait                Stairs            Wheelchair Mobility    Modified Rankin (Stroke Patients Only)       Balance Overall balance assessment: Needs  assistance Sitting-balance support: Feet unsupported;Single extremity supported Sitting balance-Leahy Scale: Poor Sitting balance - Comments: min to mod assist to maintain EOB sitting balance x 6 minutes                                     Pertinent Vitals/Pain Pain Assessment: Faces Faces Pain Scale: Hurts a little bit Pain Location: generalized Pain Descriptors / Indicators: Discomfort;Grimacing Pain Intervention(s): Monitored during session;Repositioned    Home Living Family/patient expects to be discharged to:: Private residence Living Arrangements: Spouse/significant other Available Help at Discharge: Family;Available 24 hours/day Type of Home: House Home Access: Stairs to enter Entrance Stairs-Rails: Right;Left;Can reach both Entrance Stairs-Number of Steps: 5 Home Layout: One level Home Equipment: Cane - single point;Walker - 2 wheels;Shower seat;Grab bars - tub/shower;Hand held shower head      Prior Function Level of Independence: Independent               Hand Dominance   Dominant Hand: Right    Extremity/Trunk Assessment   Upper Extremity Assessment Upper Extremity Assessment: Generalized weakness    Lower Extremity Assessment Lower Extremity Assessment: Generalized weakness       Communication   Communication: Expressive difficulties  Cognition Arousal/Alertness: Awake/alert Behavior During Therapy: Anxious Overall Cognitive Status: Difficult to assess  General Comments: Difficult to communicate due to aphasia. Perseverative speech. Repeatedly saying "have a good bowel movement." Pt following simple commands. Pleasant and cooperative.      General Comments General comments (skin integrity, edema, etc.): Pt on 6L O2 with SpO2 ranging 91-100%. Wife present at end of session.    Exercises     Assessment/Plan    PT Assessment Patient needs continued PT services  PT Problem List  Decreased strength;Decreased mobility;Decreased safety awareness;Decreased coordination;Decreased knowledge of precautions;Decreased activity tolerance;Decreased cognition;Pain;Decreased balance       PT Treatment Interventions DME instruction;Therapeutic activities;Cognitive remediation;Gait training;Therapeutic exercise;Patient/family education;Balance training;Functional mobility training    PT Goals (Current goals can be found in the Care Plan section)  Acute Rehab PT Goals Patient Stated Goal: home per wife PT Goal Formulation: With patient/family Time For Goal Achievement: 01/01/20 Potential to Achieve Goals: Fair    Frequency Min 3X/week   Barriers to discharge        Co-evaluation               AM-PAC PT "6 Clicks" Mobility  Outcome Measure Help needed turning from your back to your side while in a flat bed without using bedrails?: A Lot Help needed moving from lying on your back to sitting on the side of a flat bed without using bedrails?: A Lot Help needed moving to and from a bed to a chair (including a wheelchair)?: Total Help needed standing up from a chair using your arms (e.g., wheelchair or bedside chair)?: Total Help needed to walk in hospital room?: Total Help needed climbing 3-5 steps with a railing? : Total 6 Click Score: 8    End of Session Equipment Utilized During Treatment: Oxygen Activity Tolerance: Patient tolerated treatment well Patient left: in bed;with call bell/phone within reach;with family/visitor present Nurse Communication: Mobility status PT Visit Diagnosis: Other abnormalities of gait and mobility (R26.89);Muscle weakness (generalized) (M62.81)    Time: 1914-7829 PT Time Calculation (min) (ACUTE ONLY): 24 min   Charges:   PT Evaluation $PT Eval Moderate Complexity: 1 Mod PT Treatments $Therapeutic Activity: 8-22 mins        Lorrin Goodell, PT  Office # [phone] Pager [phone]   Lorriane Shire 12/18/2019, 3:33  PM

## 2019-12-18 NOTE — Progress Notes (Addendum)
NAME:  KINSEY COWSERT, MRN:  [id number], DOB:  1941/10/23, LOS: 9 ADMISSION DATE:  12/09/2019, CONSULTATION DATE:  12/09/18 REFERRING MD:  Bronson Ing  CHIEF COMPLAINT: Septic shock.  Brief History   Devereaux Grayson is a 79 y/o male, with a PMH of aortic aneurysm, depression, bladder cancer, multiple myeloma, who presented from [facility] on 1/28 with septic shock due to Moraxella PNA.   On 1/29 he developed shock and was intubated and placed levophed and dobutamine. He has been successfully weaned off vasopressors and sedation, but remained intubated due to encephalopathy.  Started CRRT (2/4) to see if that improves his mental status and helps him liberate from the vent.  Heparin is currently held 2/2 thrombocytopenia - will need to restart if platelets increase.  Past Medical History   Past Medical History:  Diagnosis Date  . Anemia   . Aortic aneurysm of unspecified site without mention of rupture   . Arthritis   . Bladder neck contracture   . Cancer ([place])   . Cerebral atherosclerosis    Carotid Doppler, 02/16/2013 - Bilateral Proximal ICAs,demonstrate mild plaque w/o evidence of significant diameter reduction, dissection, or any other vascular abnormality  . CHF (congestive heart failure) ([place])   . Complication of anesthesia   . COPD (chronic obstructive pulmonary disease) ([place])   . Coronary artery disease   . Depression   . Esophageal reflux   . Heart disease   . Heart murmur   . Hx of bladder cancer 10/07/2012  . Hyperlipidemia   . Hypertension   . 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 Intestinovesical fistula   . Kidney stones    history  . Lung mass   . Multiple myeloma   . Myocardial infarction [facility])    '96  . Non Hodgkin's lymphoma ([place])   . Paroxysmal atrial fibrillation ([place]) 01/02/2016  . Peripheral arterial disease ([place])   . Personal history of other diseases of circulatory system   . PONV  (postoperative nausea and vomiting)   . Prostate cancer ([place]) 2000  . Shingles   . Shortness of breath   . Sleep apnea    05-02-14 cpap , not yet used- suggested settings 5  . Stroke [facility]) 2013   Speech.     Significant Hospital Events   1/28 > Admission 1/29 > O/N Intubated 2/2 to cardiac shock. Pressures stabilizing.  2/1 > Successfully weaned off all vasopressors and sedation  Consults:  PCCM Nephrology HF   Procedures:  ETT 1/29 > 2/5 R radial art line 1/29 >  R IJ CVC 1/29 >   Significant Diagnostic Tests:  Echo 1/28 > EF 30-35%, mild LVH, mildly reduced RV systolic function.  Severely dilated LA, mod dilated RA.  Micro Data:  SARS CoV2 1/28 > neg. Flu 1/28 > neg. Blood 129 >> no growth at 2 days Respiratory 1/29 >> Moraxella catarrhalis, beta-lactamase positive >>   Antimicrobials:  Cefepime 1/29 >> 1/31 vanco 1/29 >> 1/31 Meropenem 1/31 >> 2/2 Cefuroxime 2/2 > 2/6 (full 7 day course)  Interim history/subjective:  CRRT held yesterday -282 ml I/O, Net + 4.2 L this admission Afebrile 6 L [place] Encephalopathic on exam.  Will follow commands, but unable to answer orientation questions. Will only say "I Died". No distress.   Objective:  Blood pressure 99/78, pulse 81, temperature 97.6 F (36.4 C), temperature source Axillary, resp. rate (!) 27, height 5' 7"  (1.702 m), weight 79 kg, SpO2  97 %. CVP:  [5 mmHg-6 mmHg] 5 mmHg      Intake/Output Summary (Last 24 hours) at 12/18/2019 0855 Last data filed at 12/18/2019 0800 Gross per 24 hour  Intake 989.6 ml  Output 1617 ml  Net -627.4 ml   Filed Weights   12/16/19 0500 12/17/19 0500 12/18/19 0500  Weight: 78.2 kg 80.1 kg 79 kg    Examination: General: Adult male, awake, encephalopathic Neuro: Responds to voice. Will follow commands, unable to answer orientation questions.  HEENT: AT/[place] Cardiovascular: irregular rhythm, no murmurs, rubs appreciated. Warm and well perfused Lungs: Scattered ronchi  bilaterally Abdomen: Nondistended, non tender, soft Musculoskeletal: No deformities, cyanosis, or edema Skin:  no rashes  Assessment & Plan:   Metabolic Encephalopathy / Hypoactive delirium:  -Sedation stopped 2/1 -Remains encephalopathic this am -Delirium prevention measures  Acute renal failure due to shock, ATN, hypoperfusion state.   -CRRT 2/4-2/5 -Monitor UOP today, continue supportive care  Hypernatremia -No access for FWF, pending GOC discussions today may need DHT  Multifactorial shock, cardiogenic and septic with associated Multi Organ Failure due to Moraxella pneumonia. -norepinephrine titrated to MAP > 65, currently on 1 mcg/min -Coox 50%  Moraxella pneumonia -Cefuroxime for full 7 day course, complete today -Continue to wean FiO2 for goal SpO2 > 92%  A. Fib with RVR:  Acute systolic heart failure CAD -Known history of  A Fib -LVEF of 30-35%, with a RV dysfunction from 40-45% (11/2018).     -keep K+ >4, Mg+ >2 per cardiology -Holding home Antihypertensives and diuretics -Anticoagulation currently on hold due tho thrombocytopenia -Ultimately will likely require right and left heart catheterization, repeat echocardiogram to assess LV function improvement  -IV Amio, unable to convert to PO at this time.   Thrombocytopenia secondary to sepsis/multiple myeloma.   -Platelets decreased from 133 on admission (1/28) -HIT panel negative.  DIC panel 2/4 showed INR of 1.7, PTT of 80, nL fibrinogen, D-dimer of 2.67, and no schistocytes on smear. -holding anticoagulation  Multiple Myeloma:  -Lenalidomide held, on IVIG as an outpatient  Transaminitis, improving -Continue to follow LFT intermittently  AG Metabolic Acidosis, due to lactic acidosis, acute renal failure.  -resolved  Best Practice:  Diet: NPO. SLP Eval pending Pain/Anxiety/Delirium protocol (if indicated): NA VAP protocol (if indicated): In place. DVT prophylaxis: SCD's  GI prophylaxis: PPI. Glucose  control: Sliding-scale insulin moderate scale Mobility: Bedrest. Code Status: Limited.  No CPR, no escalation.  Cardioversion or defib for acute arrhythmia acceptable Family Communication: Plan for family meeting with palliative care to determine on going [place] today. Disposition: ICU.  Update: Goals of care discussion with patient's wife. She understands her husband has been through a lot and does not want to cause any suffering. He remains on intermittent vasopressors and we are monitoring his renal recovery.  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  She believes that his goal would be to get home, if he could not obtain this goal he would not want life prolonging measures. He is pending a SLP eval, we talked about if he were to fail SLP eval she does not want him to have a DHT/NGT.  She also does not believe that ongoing dialysis would not be in align with his wishes.  She does think that he is tired and does not want to cause any suffering, she would like to go home and talk to their kids about focusing on quality of life and not  quantity.  She suspects they may have questions and would want to meet tomorrow.    Labs   CBC: Recent Labs  Lab 12/14/19 0405 12/14/19 0405 12/15/19 0857 12/16/19 0444 12/16/19 1530 12/17/19 0344 12/18/19 0406  WBC 9.9  --  10.2 8.0  --  7.7 8.7  HGB 11.2*  --  12.3* 11.6*  --  11.4* 12.6*  HCT 35.6*  --  39.3 37.4*  --  36.7* 39.9  MCV 86.8  --  87.7 88.4  --  87.8 85.8  PLT 51*   < > 50* 45* 40* 43* 48*   < > = values in this interval not displayed.   Basic Metabolic Panel: Recent Labs  Lab 12/14/19 0405 12/14/19 1256 12/15/19 0133 12/15/19 1447 12/16/19 0036 12/16/19 0444 12/16/19 1530 12/16/19 2017 12/17/19 0343 12/17/19 0344 12/17/19 1617 12/18/19 0406  NA 149*   < > 154*   < > 155*   < > 153* 151* 148*  --  143 148*  147*  K 2.7*   < > 3.6   < > 3.1*   < > 3.3* 3.5 4.1  --   4.1 4.5  4.5  CL 110   < > 111   < > 112*   < > 113* 112* 111  --  107 111  108  CO2 25   < > 27   < > 27   < > 27 26 26   --  25 24  24   GLUCOSE 191*   < > 211*   < > 175*   < > 195* 177* 154*  --  130* 194*  201*  BUN 129*   < > 165*   < > 177*   < > 158* 140* 114*  --  105* 117*  121*  CREATININE 3.44*   < > 4.05*   < > 3.70*   < > 3.08* 2.72* 2.15*  --  2.13* 2.43*  2.52*  CALCIUM 7.3*   < > 8.9   < > 8.1*   < > 7.7* 7.8* 7.8*  --  7.8* 7.8*  8.2*  MG 2.1  --  2.7*  --  2.5*  --   --   --   --  2.1  --  2.4  PHOS  --   --   --   --   --    < > 4.9* 4.5 4.3  --  5.4* 6.9*   < > = values in this interval not displayed.   GFR: Estimated Creatinine Clearance: 23.8 mL/min (A) (by C-G formula based on SCr of 2.43 mg/dL (H)). Recent Labs  Lab 12/11/19 1023 12/11/19 1935 12/15/19 0857 12/16/19 0444 12/17/19 0344 12/18/19 0406  WBC  --    < > 10.2 8.0 7.7 8.7  LATICACIDVEN 3.1*  --   --   --   --   --    < > = values in this interval not displayed.   Liver Function Tests: Recent Labs  Lab 12/12/19 0427 12/12/19 0427 12/13/19 0419 12/13/19 0419 12/15/19 0133 12/16/19 1332 12/16/19 1530 12/16/19 2017 12/17/19 0343 12/17/19 1617 12/18/19 0406  AST 1,047*  --  223*  --  71*  --   --   --   --   --   --   ALT 1,927*  --  1,175*  --  551*  --   --   --   --   --   --   Arabella Merles  74  --  87  --  115  --   --   --   --   --   --   BILITOT 2.9*  --  2.0*  --  1.5*  --   --   --   --   --   --   PROT 4.9*  --  4.8*  --  5.3*  --   --   --   --   --   --   ALBUMIN 2.2*   < > 2.1*   < > 2.1*   < > 1.8* 1.9* 1.8* 1.9* 1.9*   < > = values in this interval not displayed.   No results for input(s): LIPASE, AMYLASE in the last 168 hours. No results for input(s): AMMONIA in the last 168 hours. ABG    Component Value Date/Time   PHART 7.628 (HH) 12/11/2019 0707   PCO2ART 34.5 12/11/2019 0707   PO2ART 68.0 (L) 12/11/2019 0707   HCO3 36.1 (H) 12/11/2019 0707   TCO2 37 (H) 12/11/2019  0707   ACIDBASEDEF 19.0 (H) 12/10/2019 0419   O2SAT 50.3 12/18/2019 0410    Coagulation Profile: Recent Labs  Lab 12/16/19 1530  INR 1.7*   Cardiac Enzymes: No results for input(s): CKTOTAL, CKMB, CKMBINDEX, TROPONINI in the last 168 hours. HbA1C: Hgb A1c MFr Bld  Date/Time Value Ref Range Status  12/10/2019 09:07 PM 5.6 4.8 - 5.6 % Final    Comment:    (NOTE) Pre diabetes:          5.7%-6.4% Diabetes:              >6.4% Glycemic control for   <7.0% adults with diabetes   01/08/2019 10:57 AM 5.6 4.8 - 5.6 % Final    Comment:    (NOTE) Pre diabetes:          5.7%-6.4% Diabetes:              >6.4% Glycemic control for   <7.0% adults with diabetes    CBG: Recent Labs  Lab 12/17/19 1614 12/17/19 2038 12/18/19 0036 12/18/19 0358 12/18/19 0740  GLUCAP 108* 165* 171* 156* 163*   CCT: 33 Paulita Fujita, ACNP [facility]  After hours pager: [phone]

## 2019-12-18 NOTE — Progress Notes (Signed)
[facility] NEPHROLOGY PROGRESS NOTE  Assessment/ Plan: Pt is a 79 y.o. yo male with cardiogenic shock, sepsis, respiratory failure with AKI and uremia. CRRT since 2/4.  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 UA with some RBC and spot UPC 1.44. -He was a started on CRRT for uremia 2/4-2/5.  The filter clotted.  The CRRT was held yesterday as he is making uremic and his mental status seems improving.   -BUN and creatinine level is stable.  No plan for CRRT today.  Noticed palliative care was consulted and he is extubated. -Keep dialysis catheter until final goals of care discussion with palliative care.  #Hypernatremia:  With free water deficit.  Continue free water.  Would also recommend changing TF to lowest possible osms.  No DI as patient is not having polyuria.  #Shock: mixed septic and cardiogenic picture, requiring phenylephrine on and off. HF following  #Afib with RVR/ NSVT: on amio and coreg  #Moraxella pneumonia: on cefuroxime  # Dispo: remains in ICU.  He is DNR.  Subjective: Seen and examined in ICU.  He had already discontinued yesterday.  He is extubated.  Confused.  Denies nausea vomiting however review of system limited. Urine output of 1255 cc.  Objective Vital signs in last 24 hours: Vitals:   12/18/19 0401 12/18/19 0500 12/18/19 0600 12/18/19 0700  BP:  109/70 107/68 (!) 120/98  Pulse:  73 71 71  Resp:  (!) 30 (!) 26 (!) 27  Temp: 97.8 F (36.6 C)     TempSrc: Axillary     SpO2:  92% 92% 94%  Weight:  79 kg    Height:       Weight change: -1.1 kg  Intake/Output Summary (Last 24 hours) at 12/18/2019 0742 Last data filed at 12/18/2019 0700 Gross per 24 hour  Intake 1470.34 ml  Output 1753 ml  Net -282.66 ml       Labs: Basic Metabolic Panel: Recent Labs  Lab 12/17/19 0343 12/17/19 1617 12/18/19 0406  NA 148* 143 148*   147*  K 4.1 4.1 4.5  4.5  CL 111 107 111  108  CO2 '26 25 24  24  '$ GLUCOSE 154* 130* 194*  201*  BUN 114* 105* 117*  121*  CREATININE 2.15* 2.13* 2.43*  2.52*  CALCIUM 7.8* 7.8* 7.8*  8.2*  PHOS 4.3 5.4* 6.9*   Liver Function Tests: Recent Labs  Lab 12/12/19 0427 12/12/19 0427 12/13/19 0419 12/13/19 0419 12/15/19 0133 12/16/19 1332 12/17/19 0343 12/17/19 1617 12/18/19 0406  AST 1,047*  --  223*  --  71*  --   --   --   --   ALT 1,927*  --  1,175*  --  551*  --   --   --   --   ALKPHOS 74  --  87  --  115  --   --   --   --   BILITOT 2.9*  --  2.0*  --  1.5*  --   --   --   --   PROT 4.9*  --  4.8*  --  5.3*  --   --   --   --   ALBUMIN 2.2*   < > 2.1*   < > 2.1*   < > 1.8* 1.9* 1.9*   < > = values in this interval not displayed.   No results for  input(s): LIPASE, AMYLASE in the last 168 hours. No results for input(s): AMMONIA in the last 168 hours. CBC: Recent Labs  Lab 12/14/19 0405 12/14/19 0405 12/15/19 0857 12/15/19 0857 12/16/19 0444 12/16/19 0444 12/16/19 1530 12/17/19 0344 12/18/19 0406  WBC 9.9   < > 10.2   < > 8.0  --   --  7.7 8.7  HGB 11.2*   < > 12.3*   < > 11.6*  --   --  11.4* 12.6*  HCT 35.6*   < > 39.3   < > 37.4*  --   --  36.7* 39.9  MCV 86.8  --  87.7  --  88.4  --   --  87.8 85.8  PLT 51*   < > 50*   < > 45*   < > 40* 43* 48*   < > = values in this interval not displayed.   Cardiac Enzymes: No results for input(s): CKTOTAL, CKMB, CKMBINDEX, TROPONINI in the last 168 hours. CBG: Recent Labs  Lab 12/17/19 1116 12/17/19 1614 12/17/19 2038 12/18/19 0036 12/18/19 0358  GLUCAP 156* 108* 165* 171* 156*    Iron Studies: No results for input(s): IRON, TIBC, TRANSFERRIN, FERRITIN in the last 72 hours. Studies/Results: US RENAL  Result Date: 12/16/2019 CLINICAL DATA:  79 year old male with acute renal insufficiency. EXAM: RENAL / URINARY TRACT ULTRASOUND COMPLETE COMPARISON:  Abdominal ultrasound dated 12/04/2017 and CT abdomen pelvis  dated 11/18/2019. FINDINGS: Right Kidney: Renal measurements: 10.1 x 5.1 x 3.0 cm = volume: 80 mL. Mild parenchyma atrophy. Normal echogenicity. No hydronephrosis or shadowing stone. Left Kidney: Renal measurements: 11.1 x 5.0 x 4.1 cm = volume: 120 mL. Mild parenchyma atrophy. Normal echogenicity. No hydronephrosis or shadowing stone. There is a 3.8 x 4.2 x 3.5 cm left renal inferior pole cyst. Bladder: Appears normal for degree of bladder distention. Other: Fatty infiltration of the visualized liver. Trace perihepatic fluid (image 17/52 and 4/52). IMPRESSION: 1. Mild parenchyma atrophy.  No hydronephrosis or shadowing stone. 2. Left renal inferior pole cyst. 3. Fatty liver 4. Probable trace perihepatic fluid. Electronically Signed   By: Anner Crete M.D.   On: 12/16/2019 21:14    Medications: Infusions: .  prismasol BGK 4/2.5 400 mL/hr at 12/16/19 1255  .  prismasol BGK 4/2.5 200 mL/hr at 12/16/19 1254  . sodium chloride Stopped (12/18/19 0031)  . amiodarone 30 mg/hr (12/18/19 0700)  . cefUROXime (ZINACEF)  IV Stopped (12/17/19 2310)  . feeding supplement (VITAL AF 1.2 CAL) Stopped (12/17/19 1045)  . norepinephrine (LEVOPHED) Adult infusion 2 mcg/min (12/18/19 0700)  . prismasol BGK 4/2.5 1,000 mL/hr at 12/17/19 0804    Scheduled Medications: . atorvastatin  10 mg Oral q1800  . B-complex with vitamin C  1 tablet Oral Daily  . chlorhexidine gluconate (MEDLINE KIT)  15 mL Mouth Rinse BID  . Chlorhexidine Gluconate Cloth  6 each Topical Daily  . dextrose  50 mL Intravenous Once  . feeding supplement (PRO-STAT SUGAR FREE 64)  30 mL Per Tube Daily  . free water  400 mL Per Tube Q4H  . insulin aspart  0-15 Units Subcutaneous Q4H  . mouth rinse  15 mL Mouth Rinse 10 times per day  . pantoprazole (PROTONIX) IV  40 mg Intravenous Q12H  . sodium chloride flush  3 mL Intravenous Q12H    have reviewed scheduled and prn medications.  Physical Exam: General: Extubated, following commands,  confused Heart:RRR, s1s2 nl Lungs: Coarse breath sound bilateral, Abdomen:soft, Non-tender, non-distended Extremities:No  edema Dialysis Access: IJ temporary catheter.  Cyndra Feinberg Prasad Cullin Dishman 12/18/2019,7:42 AM  LOS: 9 days  Pager: [phone]

## 2019-12-19 DIAGNOSIS — Z7189 Other specified counseling: Secondary | ICD-10-CM

## 2019-12-19 DIAGNOSIS — Z515 Encounter for palliative care: Secondary | ICD-10-CM

## 2019-12-19 LAB — RENAL FUNCTION PANEL
Albumin: 1.5 g/dL — ABNORMAL LOW (ref 3.5–5.0)
Anion gap: 13 (ref 5–15)
BUN: 121 mg/dL — ABNORMAL HIGH (ref 8–23)
CO2: 21 mmol/L — ABNORMAL LOW (ref 22–32)
Calcium: 7.1 mg/dL — ABNORMAL LOW (ref 8.9–10.3)
Chloride: 109 mmol/L (ref 98–111)
Creatinine, Ser: 2.48 mg/dL — ABNORMAL HIGH (ref 0.61–1.24)
GFR calc Af Amer: 28 mL/min — ABNORMAL LOW (ref >60)
GFR calc non Af Amer: 24 mL/min — ABNORMAL LOW (ref >60)
Glucose, Bld: 118 mg/dL — ABNORMAL HIGH (ref 70–99)
Phosphorus: 6.2 mg/dL — ABNORMAL HIGH (ref 2.5–4.6)
Potassium: 3.5 mmol/L (ref 3.5–5.1)
Sodium: 143 mmol/L (ref 135–145)

## 2019-12-19 LAB — COOXEMETRY PANEL
Carboxyhemoglobin: 0.8 % (ref 0.5–1.5)
Methemoglobin: 0.6 % (ref 0.0–1.5)
O2 Saturation: 51.2 %
Total hemoglobin: 10.1 g/dL — ABNORMAL LOW (ref 12.0–16.0)

## 2019-12-19 LAB — CBC
HCT: 34.3 % — ABNORMAL LOW (ref 39.0–52.0)
Hemoglobin: 11.2 g/dL — ABNORMAL LOW (ref 13.0–17.0)
MCH: 27 pg (ref 26.0–34.0)
MCHC: 32.7 g/dL (ref 30.0–36.0)
MCV: 82.7 fL (ref 80.0–100.0)
Platelets: 48 10*3/uL — ABNORMAL LOW (ref 150–400)
RBC: 4.15 MIL/uL — ABNORMAL LOW (ref 4.22–5.81)
RDW: 21.1 % — ABNORMAL HIGH (ref 11.5–15.5)
WBC: 9.7 10*3/uL (ref 4.0–10.5)
nRBC: 0 % (ref 0.0–0.2)

## 2019-12-19 LAB — GLUCOSE, CAPILLARY
Glucose-Capillary: 115 mg/dL — ABNORMAL HIGH (ref 70–99)
Glucose-Capillary: 114 mg/dL — ABNORMAL HIGH (ref 70–99)
Glucose-Capillary: 172 mg/dL — ABNORMAL HIGH (ref 70–99)
Glucose-Capillary: 147 mg/dL — ABNORMAL HIGH (ref 70–99)

## 2019-12-19 LAB — MAGNESIUM: Magnesium: 2.1 mg/dL (ref 1.7–2.4)

## 2019-12-19 MED ORDER — CEFDINIR 300 MG PO CAPS
300.0000 mg | ORAL_CAPSULE | Freq: Two times a day (BID) | ORAL | Status: AC
  Administered 2019-12-19 – 2019-12-23 (×9): 300 mg via ORAL
  Filled 2019-12-19 (×10): qty 1

## 2019-12-19 MED ORDER — GERHARDT'S BUTT CREAM
TOPICAL_CREAM | CUTANEOUS | Status: DC | PRN
  Administered 2019-12-19: 1 via TOPICAL
  Filled 2019-12-19 (×2): qty 1

## 2019-12-19 MED ORDER — INSULIN ASPART 100 UNIT/ML ~~LOC~~ SOLN
0.0000 [IU] | Freq: Three times a day (TID) | SUBCUTANEOUS | Status: DC
  Administered 2019-12-19: 12:00:00 3 [IU] via SUBCUTANEOUS
  Administered 2019-12-19: 2 [IU] via SUBCUTANEOUS
  Administered 2019-12-20: 3 [IU] via SUBCUTANEOUS
  Administered 2019-12-20 – 2019-12-21 (×3): 2 [IU] via SUBCUTANEOUS

## 2019-12-19 MED ORDER — RESOURCE THICKENUP CLEAR PO POWD
ORAL | Status: DC | PRN
  Filled 2019-12-19: qty 125

## 2019-12-19 MED ORDER — AMIODARONE HCL 200 MG PO TABS
200.0000 mg | ORAL_TABLET | Freq: Two times a day (BID) | ORAL | Status: DC
  Administered 2019-12-19 – 2019-12-27 (×16): 200 mg via ORAL
  Filled 2019-12-19 (×17): qty 1

## 2019-12-19 NOTE — Consult Note (Signed)
Consultation Note Date: 12/19/2019   Patient Name: Jeremy Parrish  DOB: 16-Dec-1940  MRN: [id number]  Age / Sex: 79 y.o., male  PCP: Celene Squibb, MD Referring Physician: Kipp Brood, MD  Reason for Consultation: Establishing goals of care  HPI/Patient Profile:  Per intensivist note--> [name] is a 79 y/o male, with a PMH of aortic aneurysm, depression, bladder cancer, multiple myeloma, who presented from [facility] on 1/28 with septic shock due to Moraxella PNA.   Palliative care was consulted on 2/5 after patient was extubated in the setting of septic shock from moraxella pneumonia to review goals of care.   Clinical Assessment and Goals of Care: I have reviewed medical records including EPIC notes, labs and imaging. Per discussion with bedside RN, Danae Chen who had [name] yesterday he has been stable. He was able to eat some yesterday.She shared that the family would like to presently stay the course. Assessed patient who was at the time sleeping, did not appear to exemplify any nonverbal signs of distress.    I called Jeremy Parrish (patients wife) to further discuss diagnosis prognosis, GOC, EOL wishes, disposition and options.   I introduced Palliative Medicine as specialized medical care for people living with serious illness. It focuses on providing relief from the symptoms and stress of a serious illness. The goal is to improve quality of life for both the patient and the family.  Jeremy Parrish stated that she has been on a "roller coaster" since last Wednesday. She said that her emotional state has been quite tenuous associated with the ups and downs of [name] hospital stay. We completed a brief review of his social and functional history. Mr. Enke and she have been married for 51 seven years, their anniversary was last Saturday. They share three children, six grandchildren, and two great grandchildren. 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 As a family they most enjoyed being outside, going camping, and being members of their church. Over the last year due to the pandemic they have not been as active in the church though. Jeremy Parrish said irregardless [name] has firm beliefs and strong faith. [name] use to smoke and drink but quite many years ago. Prior to hospitalization the patient was able to perform bADLs he utilized a cane and rollator walker to assist in mobility as of two months ago.   Since 2002 Mr. Mancillas has dealt with a multitude of health ailments per Jeremy Parrish. She said that his heart attacks, stroke, cancer, and hip replacement have been difficult on him. She said that they have not had an easy life, but it has been a good life.  I asked Jeremy Parrish if she understands his current state in the hospital. She was able to vocally review the events that have occurred since he first came in ten days ago. She said that she was convinced her was going to die a few days ago after they extubated him. She reaffirmed  that the patient should be DNR/DNI, no feeding tubes, no aggressive heroic measures. She sates that the patient himself has said I want to meet the Reita Cliche."   I asked if Jeremy Parrish and her family had discussed hospice at all. Explained that hospice is a service for all eligible patients  with a terminal diagnosis with <6 month prognosis. She stated that her mother died of brain cancer in 42 on hospice and that this was a service she had familiarity with. We discussed more about [name] and how focusing on his time left and the quality of that time truly aligns with hospice values. Jeremy Parrish said that she would be interested in getting more information about hospice. We ended by agreeing to continue the current course of care to identify if patient may improve or decline. Jeremy Parrish is realistic that if he should decline our focus at that point  would shift.   Discussed the importance of continued conversation with family and their medical providers regarding overall plan of care and treatment options, ensuring decisions are within the context of the patients values and GOCs.  Question answered.  Decision Maker: Jeremy Parrish (Spouse) [phone]  SUMMARY OF RECOMMENDATIONS   DNR/DNI, no heroic measures  Currently family would like to continue current course of treatment  TOC --> Hospice consult for informational purposes  Code Status/Advance Care Planning:  DNR  Palliative Prophylaxis:   Aspiration, Bowel Regimen, Delirium Protocol, Eye Care, Frequent Pain Assessment, Oral Care, Palliative Wound Care and Turn Reposition  Additional Recommendations (Limitations, Scope, Preferences):  Continue current scope of treatment  Psycho-social/Spiritual:   Desire for further Chaplaincy support: YES  Additional Recommendations: Caregiving  Support/Resources and Education on Hospice  Prognosis:   < 6 weeks  Discharge Planning: To Be Determined, will depend on goals. PT recommends CIR.     Primary Diagnoses: Present on Admission: . 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 Multiple myeloma in remission ([place]) . Anxiety state . Essential hypertension . Coronary atherosclerosis . Hyperlipidemia with target LDL less than 100 . Prediabetes . Obstructive sleep apnea . COPD (chronic obstructive pulmonary disease) ([place])  I have reviewed the medical record, interviewed the patient and family, and examined the patient. The following aspects are pertinent.  Past Medical History:  Diagnosis Date  . Anemia   . Aortic aneurysm of unspecified site without mention of rupture   . Arthritis   . Bladder neck contracture   . Cancer ([place])   . Cerebral atherosclerosis    Carotid Doppler, 02/16/2013 - Bilateral Proximal ICAs,demonstrate mild plaque w/o evidence of significant diameter reduction, dissection, or any other vascular  abnormality  . CHF (congestive heart failure) ([place])   . Complication of anesthesia   . COPD (chronic obstructive pulmonary disease) ([place])   . Coronary artery disease   . Depression   . Esophageal reflux   . Heart disease   . Heart murmur   . Hx of bladder cancer 10/07/2012  . Hyperlipidemia   . Hypertension   . 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 Intestinovesical fistula   . Kidney stones    history  . Lung mass   . Multiple myeloma   . Myocardial infarction [facility])    '96  . Non Hodgkin's lymphoma ([place])   . Paroxysmal atrial fibrillation ([place]) 01/02/2016  . Peripheral arterial disease ([place])   . Personal history of other diseases of circulatory system   . PONV (postoperative nausea and  vomiting)   . Prostate cancer ([place]) 2000  . Shingles   . Shortness of breath   . Sleep apnea    05-02-14 cpap , not yet used- suggested settings 5  . Stroke [facility]) 2013   Speech.   Social History   Socioeconomic History  . Marital status: Married    Spouse name: Jeremy Parrish  . Number of children: 3  . Years of education: 9th  . Highest education level: Not on file  Occupational History  . Occupation: retired   Tobacco Use  . Smoking status: Former Smoker    Packs/day: 1.00    Years: 20.00    Pack years: 20.00    Types: Cigarettes    Quit date: 11/14/1994    Years since quitting: 25.1  . Smokeless tobacco: Never Used  Substance and Sexual Activity  . Alcohol use: No    Alcohol/week: 0.0 standard drinks    Comment: previously drank but none for at least 15 years.  . Drug use: No  . Sexual activity: Not on file  Other Topics Concern  . Not on file  Social History Narrative   Patient lives at home spouse.   Caffeine Use: Occasionally   Social Determinants of Health   Financial Resource Strain:   . Difficulty of Paying Living Expenses: Not on file  Food Insecurity:   . Worried About Charity fundraiser in the  Last Year: Not on file  . Ran Out of Food in the Last Year: Not on file  Transportation Needs:   . Lack of Transportation (Medical): Not on file  . Lack of Transportation (Non-Medical): Not on file  Physical Activity:   . Days of Exercise per Week: Not on file  . Minutes of Exercise per Session: Not on file  Stress:   . Feeling of Stress : Not on file  Social Connections:   . Frequency of Communication with Friends and Family: Not on file  . Frequency of Social Gatherings with Friends and Family: Not on file  . Attends Religious Services: Not on file  . Active Member of Clubs or Organizations: Not on file  . Attends Archivist Meetings: Not on file  . Marital Status: Not on file   Family History  Problem Relation Age of Onset  . Cancer Father        bladder  . Heart disease Father        before age 79  . Hypertension Mother   . Cancer Brother   . Heart disease Brother        before age 84  . Heart disease Sister        before age 5  . Hyperlipidemia Sister   . Hypertension Sister   . Heart attack Sister   . Colon cancer Neg Hx   . Colon polyps Neg Hx    Scheduled Meds: . atorvastatin  10 mg Oral q1800  . B-complex with vitamin C  1 tablet Oral Daily  . chlorhexidine gluconate (MEDLINE KIT)  15 mL Mouth Rinse BID  . Chlorhexidine Gluconate Cloth  6 each Topical Daily  . dextrose  50 mL Intravenous Once  . insulin aspart  0-15 Units Subcutaneous Q4H  . pantoprazole (PROTONIX) IV  40 mg Intravenous Q12H  . sodium chloride flush  3 mL Intravenous Q12H   Continuous Infusions: .  prismasol BGK 4/2.5 400 mL/hr at 12/16/19 1255  .  prismasol BGK 4/2.5 200 mL/hr at 12/16/19 1254  . sodium chloride  250 mL (12/19/19 0644)  . amiodarone 30 mg/hr (12/19/19 0536)  . cefUROXime (ZINACEF)  IV 1.5 g (12/19/19 0534)  . norepinephrine (LEVOPHED) Adult infusion Stopped (12/18/19 1229)  . prismasol BGK 4/2.5 1,000 mL/hr at 12/17/19 0804   PRN Meds:.sodium chloride,  acetaminophen **OR** acetaminophen, albuterol, artificial tears, bisacodyl, fentaNYL (SUBLIMAZE) injection, Gerhardt's butt cream, heparin, loperamide, midazolam, [DISCONTINUED] ondansetron **OR** ondansetron (ZOFRAN) IV, oxyCODONE, Resource ThickenUp Clear, sodium chloride flush, sodium chloride flush Medications Prior to Admission:  Prior to Admission medications   Medication Sig Start Date End Date Taking? Authorizing Provider  albuterol (PROVENTIL HFA;VENTOLIN HFA) 108 (90 Base) MCG/ACT inhaler Inhale 2 puffs into the lungs every 6 (six) hours as needed for wheezing or shortness of breath. 11/17/17  Yes Mikey Kirschner, MD  albuterol (PROVENTIL) (2.5 MG/3ML) 0.083% nebulizer solution Take 2.5 mg by nebulization every 4 (four) hours as needed for wheezing or shortness of breath.  04/19/16  Yes [provider]  ALPRAZolam (XANAX) 0.25 MG tablet Take 0.25 mg by mouth as needed.  06/14/19  Yes [provider]  amLODipine (NORVASC) 5 MG tablet Take 10 mg by mouth daily.    Yes [provider]  clopidogrel (PLAVIX) 75 MG tablet TAKE 1 TABLET BY MOUTH EVERY DAY 07/07/19  Yes Croitoru, Mihai, MD  ELIQUIS 5 MG TABS tablet TAKE 1 TABLET BY MOUTH TWICE A DAY 10/20/19  Yes Croitoru, Mihai, MD  furosemide (LASIX) 20 MG tablet Use sparingly as needed for edema. No more than one tablet by mouth per week. 07/29/17  Yes Lorretta Harp, MD  HYDROcodone-acetaminophen (NORCO/VICODIN) 5-325 MG tablet Take 1 tablet by mouth 2 (two) times daily as needed. for pain 05/21/18  Yes [provider]  ipratropium-albuterol (DUONEB) 0.5-2.5 (3) MG/3ML SOLN Take 3 mLs by nebulization every 6 (six) hours as needed (shortness of breath).  01/27/19  Yes [provider]  lenalidomide (REVLIMID) 10 MG capsule TAKE 1 CAPSULE BY MOUTH  DAILY FOR 7 DAYS ON THEN 7  DAYS OFF THEN REPEAT CYCLE 11/19/19  Yes Derek Jack, MD  levocetirizine (XYZAL) 5 MG tablet Take 5 mg by mouth every evening.     Yes [provider]  methocarbamol (ROBAXIN) 750 MG tablet Take 750 mg by mouth every 8 (eight) hours as needed for muscle spasms.  02/06/19  Yes [provider]  mometasone (NASONEX) 50 MCG/ACT nasal spray Place 2 sprays into the nose daily as needed (allergies).  06/10/18  Yes [provider]  nitroGLYCERIN (NITROSTAT) 0.4 MG SL tablet PLACE 1 TABLET UNDER THE TONGUE EVERY 5 MINUTES AS NEEDED FOR CHEST PAIN. 08/27/19  Yes Croitoru, Mihai, MD  ondansetron (ZOFRAN) 4 MG tablet Take 4 mg by mouth every 8 (eight) hours as needed for nausea or vomiting.  04/08/18  Yes [provider]  pantoprazole (PROTONIX) 40 MG tablet Take 40 mg by mouth daily.   Yes [provider]  potassium chloride SA (KLOR-CON M20) 20 MEQ tablet Take 1 tablet (20 mEq total) by mouth 3 (three) times daily. 10/15/19  Yes Lockamy, Randi L, NP-C  simvastatin (ZOCOR) 20 MG tablet Take 1 tablet (20 mg total) by mouth daily at 6 PM. 11/24/19  Yes Croitoru, Mihai, MD  diltiazem (CARDIZEM CD) 120 MG 24 hr capsule Take 1 capsule (120 mg total) by mouth daily. Patient not taking: Reported on 12/09/2019 05/25/19   Croitoru, Mihai, MD  loperamide (IMODIUM) 2 MG capsule TAKE 2 CAPSULES BY MOUTH 2 TIMES DAILY AS NEEDED FOR DIARRHEA  OR LOOSE STOOLS. Patient not taking: Reported on 10/28/2019 10/22/19   Noralyn Pick, NP  octreotide (SANDOSTATIN LAR DEPOT) 10 MG injection Inject 10 mg into the muscle every 28 (twenty-eight) days. 06/28/19   Noralyn Pick, NP  omeprazole (PRILOSEC) 20 MG capsule Take 20 mg by mouth daily.    [provider]   Allergies  Allergen Reactions  . Morphine And Related Other (See Comments)    hallucinations  . Tape Rash    Paper tape is ok   Review of Systems  Unable to perform ROS  Physical Exam Vitals and nursing note reviewed.  HENT:     Head: Normocephalic.     Nose: Nose normal.     Mouth/Throat:     Mouth: Mucous membranes are dry.    Eyes:     Pupils: Pupils are equal, round, and reactive to light.  Cardiovascular:     Rate and Rhythm: Rhythm irregular.  Pulmonary:     Effort: Pulmonary effort is normal.  Abdominal:     Palpations: Abdomen is soft.  Skin:    General: Skin is warm and dry.     Capillary Refill: Capillary refill takes less than 2 seconds.    Vital Signs: BP 111/78 (BP Location: Right Arm)   Pulse 63   Temp (!) 94.2 F (34.6 C) (Axillary)   Resp 18   Ht '5\' 7"'$  (1.702 m)   Wt 79.6 kg   SpO2 96%   BMI 27.49 kg/m  Pain Scale: CPOT POSS *See Group Information*: 2-Acceptable,Slightly drowsy, easily aroused Pain Score: Asleep  SpO2: SpO2: 96 % O2 Device:SpO2: 96 % O2 Flow Rate: .O2 Flow Rate (L/min): 6 L/min  IO: Intake/output summary:   Intake/Output Summary (Last 24 hours) at 12/19/2019 0648 Last data filed at 12/19/2019 0400 Gross per 24 hour  Intake 827.85 ml  Output 1640 ml  Net -812.15 ml   LBM: Last BM Date: 12/19/19 Baseline Weight: Weight: 81.6 kg Most recent weight: Weight: 79.6 kg     Palliative Assessment/Data:30%   Time In: 0750 Time Out: 0900 Time Total: 70 Greater than 50%  of this time was spent counseling and coordinating care related to the above assessment and plan.  Signed by: Rosezella Rumpf, NP   Please contact Palliative Medicine Team phone at [phone] for questions and concerns.  For individual provider: See Shea Evans

## 2019-12-19 NOTE — Progress Notes (Signed)
[facility] PROGRESS NOTE  Assessment/ Plan: Pt is a 79 y.o. yo male with cardiogenic shock, sepsis, respiratory failure with AKI and uremia. CRRT since 2/4.  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 UA with some RBC and spot UPC 1.44. -He was a started on CRRT for uremia 2/4-2/5.   -He has increased urine output and creatinine level trending down today.  No plan for CRRT today.  If his renal function continue to improve by tomorrow then we can discontinue the dialysis catheter. -Noted palliative care was consulted and he is extubated.  #Hypernatremia:  Sodium level improved.  Would also recommend changing TF to lowest possible osms.  No DI as patient is not having polyuria.  #Shock: mixed septic and cardiogenic picture, requiring phenylephrine on and off. HF following  #Afib with RVR/ NSVT: on amio and coreg  #Moraxella pneumonia: on cefuroxime  # Dispo: remains in ICU.  He is DNR.  Subjective: Seen and examined in ICU.  Urine output of 2000 cc.  Patient has intermittent delirium.  No new event. Objective Vital signs in last 24 hours: Vitals:   12/19/19 0500 12/19/19 0600 12/19/19 0700 12/19/19 0731  BP: 101/81 112/76 113/69   Pulse: 76 62    Resp: (!) 29 (!) 21 (!) 42   Temp:    (!) 96.6 F (35.9 C)  TempSrc:    Oral  SpO2: 96% 95% 92%   Weight:      Height:       Weight change: 0.6 kg  Intake/Output Summary (Last 24 hours) at 12/19/2019 0851 Last data filed at 12/19/2019 0800 Gross per 24 hour  Intake 1196.9 ml  Output 2310 ml  Net -1113.1 ml       Labs: Basic Metabolic Panel: Recent Labs  Lab 12/18/19 0406 12/18/19 1522 12/19/19 0425  NA 148*  147* 143 143  K 4.5  4.5 4.0 3.5  CL 111  108 106 109  CO2 24  24 21* 21*  GLUCOSE 194*  201* 284* 118*  BUN 117*  121* 131* 121*  CREATININE 2.43*  2.52* 2.76* 2.48*   CALCIUM 7.8*  8.2* 8.0* 7.1*  PHOS 6.9* 7.3* 6.2*   Liver Function Tests: Recent Labs  Lab 12/13/19 0419 12/13/19 0419 12/15/19 0133 12/16/19 1332 12/18/19 0406 12/18/19 1522 12/19/19 0425  AST 223*  --  71*  --   --   --   --   ALT 1,175*  --  551*  --   --   --   --   ALKPHOS 87  --  115  --   --   --   --   BILITOT 2.0*  --  1.5*  --   --   --   --   PROT 4.8*  --  5.3*  --   --   --   --   ALBUMIN 2.1*   < > 2.1*   < > 1.9* 1.8* 1.5*   < > = values in this interval not displayed.   No results for input(s): LIPASE, AMYLASE in the last 168 hours. No results for input(s): AMMONIA in the last 168 hours. CBC: Recent Labs  Lab 12/15/19 0857 12/15/19 0857 12/16/19 0444 12/16/19 1530 12/17/19 0344 12/18/19 0406 12/19/19 0425  WBC 10.2   < > 8.0  --  7.7 8.7 9.7  HGB 12.3*   < > 11.6*  --  11.4* 12.6* 11.2*  HCT 39.3   < > 37.4*  --  36.7* 39.9 34.3*  MCV 87.7  --  88.4  --  87.8 85.8 82.7  PLT 50*   < > 45*   < > 43* 48* 48*   < > = values in this interval not displayed.   Cardiac Enzymes: No results for input(s): CKTOTAL, CKMB, CKMBINDEX, TROPONINI in the last 168 hours. CBG: Recent Labs  Lab 12/18/19 1921 12/18/19 2114 12/18/19 2319 12/19/19 0325 12/19/19 0727  GLUCAP 220* 160* 162* 115* 114*    Iron Studies: No results for input(s): IRON, TIBC, TRANSFERRIN, FERRITIN in the last 72 hours. Studies/Results: No results found.  Medications: Infusions: .  prismasol BGK 4/2.5 400 mL/hr at 12/16/19 1255  .  prismasol BGK 4/2.5 200 mL/hr at 12/16/19 1254  . sodium chloride 5 mL/hr at 12/19/19 0800  . amiodarone 30 mg/hr (12/19/19 0800)  . cefUROXime (ZINACEF)  IV Stopped (12/19/19 0604)  . norepinephrine (LEVOPHED) Adult infusion Stopped (12/18/19 1229)  . prismasol BGK 4/2.5 1,000 mL/hr at 12/17/19 0804    Scheduled Medications: . atorvastatin  10 mg Oral q1800  . B-complex with vitamin C  1 tablet Oral Daily  . chlorhexidine gluconate (MEDLINE KIT)   15 mL Mouth Rinse BID  . Chlorhexidine Gluconate Cloth  6 each Topical Daily  . dextrose  50 mL Intravenous Once  . insulin aspart  0-15 Units Subcutaneous Q4H  . pantoprazole (PROTONIX) IV  40 mg Intravenous Q12H  . sodium chloride flush  3 mL Intravenous Q12H    have reviewed scheduled and prn medications.  Physical Exam: General: Confused male lying in bed, not in distress Heart:RRR, s1s2 nl Lungs: Coarse breath sound bilateral, Abdomen:soft, Non-tender, non-distended Extremities:No edema Dialysis Access: IJ temporary catheter.  Tazia Illescas Prasad Geetika Laborde 12/19/2019,8:51 AM  LOS: 10 days  Pager: [phone]

## 2019-12-19 NOTE — Plan of Care (Signed)
Jeremy Parrish has been alert this shift. Expressive aphasia noted, but still able to answer questions and converse. BP wnl. Increased stool output around the flexiseal. Flexiseal assessed, irrigated, and volumed checked. Gerdhardt butt cream applied to skin along with barrier cream to help prevent skin breakdown. Family updated on POC. Day shift RN given report and updated on POC.   Problem: Education: Goal: Knowledge of disease or condition will improve Outcome: Progressing   Problem: Activity: Goal: Ability to tolerate increased activity will improve Outcome: Progressing   Problem: Cardiac: Goal: Ability to achieve and maintain adequate cardiopulmonary perfusion will improve Outcome: Progressing   Problem: Clinical Measurements: Goal: Ability to maintain clinical measurements within normal limits will improve Outcome: Progressing

## 2019-12-19 NOTE — Progress Notes (Signed)
Report called and patient transferred to 2W06 via bed. VSS. Patient in no visible distress. Wife accompanied. Chart and patient meds given to RN.

## 2019-12-19 NOTE — Progress Notes (Signed)
Advanced Heart Failure Team Rounding Note   Primary Physician: Celene Squibb, MD PCP-Cardiologist:  Sanda Klein, MD   Requesting: Dr. Jacinta Shoe    HPI:    Extubated 2/5.   Much more calm today. Comfortable. Off pressors. Denies CP or SOB. No co-ox drawn.   Creatinine improving. K ok  CVP 5.    Past Medical History: Past Medical History:  Diagnosis Date  . Anemia   . Aortic aneurysm of unspecified site without mention of rupture   . Arthritis   . Bladder neck contracture   . Cancer ([place])   . Cerebral atherosclerosis    Carotid Doppler, 02/16/2013 - Bilateral Proximal ICAs,demonstrate mild plaque w/o evidence of significant diameter reduction, dissection, or any other vascular abnormality  . CHF (congestive heart failure) ([place])   . Complication of anesthesia   . COPD (chronic obstructive pulmonary disease) ([place])   . Coronary artery disease   . Depression   . Esophageal reflux   . Heart disease   . Heart murmur   . Hx of bladder cancer 10/07/2012  . Hyperlipidemia   . Hypertension   . 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 Intestinovesical fistula   . Kidney stones    history  . Lung mass   . Multiple myeloma   . Myocardial infarction [facility])    '96  . Non Hodgkin's lymphoma ([place])   . Paroxysmal atrial fibrillation ([place]) 01/02/2016  . Peripheral arterial disease ([place])   . Personal history of other diseases of circulatory system   . PONV (postoperative nausea and vomiting)   . Prostate cancer ([place]) 2000  . Shingles   . Shortness of breath   . Sleep apnea    05-02-14 cpap , not yet used- suggested settings 5  . Stroke [facility]) 2013   Speech.    Past Surgical History: Past Surgical History:  Procedure Laterality Date  . BLADDER SURGERY    . BONE MARROW TRANSPLANT  2011  . CARDIOVERSION N/A 06/02/2019   Procedure: CARDIOVERSION;  Surgeon: Sanda Klein, MD;  Location: [facility] ENDOSCOPY;   Service: Cardiovascular;  Laterality: N/A;  . COLON SURGERY     colon resection  . COLONOSCOPY N/A 01/01/2013   Procedure: COLONOSCOPY;  Surgeon: Rogene Houston, MD;  Location: [facility] ENDO SUITE;  Service: Endoscopy;  Laterality: N/A;  825-moved to [place] notified pt  . COLONOSCOPY N/A 07/16/2018   Procedure: COLONOSCOPY;  Surgeon: Rogene Houston, MD;  Location: [facility] ENDO SUITE;  Service: Endoscopy;  Laterality: N/A;  1:25  . CORONARY ANGIOPLASTY  06/24/2000   PCI and stenting in mid & proximal RCA  . heart stents x 5  1999  . INGUINAL HERNIA REPAIR Right 05/04/2014   Procedure: OPEN RIGHT INGUINAL HERNIA REPAIR with mesh;  Surgeon: Edward Jolly, MD;  Location: [facility] ORS;  Service: General;  Laterality: Right;  . INSERT / REPLACE / REMOVE PACEMAKER    . left ear skin cancer removed    . NM MYOCAR PERF WALL MOTION  11/27/2007   inferior scar  . PACEMAKER INSERTION  07/22/2011   [employer]  . POLYPECTOMY  07/16/2018   Procedure: POLYPECTOMY;  Surgeon: Rogene Houston, MD;  Location: [facility] ENDO SUITE;  Service: Endoscopy;;  colon  . PORTACATH PLACEMENT  07/26/2009   right chest  . PROSTATE SURGERY    . Rotator    . ROTATOR CUFF REPAIR Right   . SHOULDER ARTHROSCOPY WITH  SUBACROMIAL DECOMPRESSION Right 07/21/2013   Procedure: RIGHT SHOULDER ARTHROSCOPY WITH SUBACROMIAL DECOMPRESSION AND DEBRIDEMENT & Injection of Left Shoulder;  Surgeon: Alta Corning, MD;  Location: [facility];  Service: Orthopedics;  Laterality: Right;  . TEE WITHOUT CARDIOVERSION  10/13/2012   Procedure: TRANSESOPHAGEAL ECHOCARDIOGRAM (TEE);  Surgeon: Sanda Klein, MD;  Location: [facility] ENDOSCOPY;  Service: Cardiovascular;  Laterality: N/A;  pat/kay/echo notified  . US ECHOCARDIOGRAPHY  06/19/2011   RV mildly dilated,mild to mod. MR,mild AI,mild PI  . WRIST SURGERY     right    Family History: Family History  Problem Relation Age of Onset  . Cancer Father        bladder  . Heart disease Father        before age 47  . Hypertension  Mother   . Cancer Brother   . Heart disease Brother        before age 63  . Heart disease Sister        before age 66  . Hyperlipidemia Sister   . Hypertension Sister   . Heart attack Sister   . Colon cancer Neg Hx   . Colon polyps Neg Hx     Social History: Social History   Socioeconomic History  . Marital status: Married    Spouse name: [name]  . Number of children: 3  . Years of education: 9th  . Highest education level: Not on file  Occupational History  . Occupation: retired   Tobacco Use  . Smoking status: Former Smoker    Packs/day: 1.00    Years: 20.00    Pack years: 20.00    Types: Cigarettes    Quit date: 11/14/1994    Years since quitting: 25.1  . Smokeless tobacco: Never Used  Substance and Sexual Activity  . Alcohol use: No    Alcohol/week: 0.0 standard drinks    Comment: previously drank but none for at least 15 years.  . Drug use: No  . Sexual activity: Not on file  Other Topics Concern  . Not on file  Social History Narrative   Patient lives at home spouse.   Caffeine Use: Occasionally   Social Determinants of Health   Financial Resource Strain:   . Difficulty of Paying Living Expenses: Not on file  Food Insecurity:   . Worried About Charity fundraiser in the Last Year: Not on file  . Ran Out of Food in the Last Year: Not on file  Transportation Needs:   . Lack of Transportation (Medical): Not on file  . Lack of Transportation (Non-Medical): Not on file  Physical Activity:   . Days of Exercise per Week: Not on file  . Minutes of Exercise per Session: Not on file  Stress:   . Feeling of Stress : Not on file  Social Connections:   . Frequency of Communication with Friends and Family: Not on file  . Frequency of Social Gatherings with Friends and Family: Not on file  . Attends Religious Services: Not on file  . Active Member of Clubs or Organizations: Not on file  . Attends Archivist Meetings: Not on file  . Marital Status: Not  on file    Allergies:  Allergies  Allergen Reactions  . Morphine And Related Other (See Comments)    hallucinations  . Tape Rash    Paper tape is ok    Objective:    Vital Signs:   Temp:  [94.2 F (34.6 C)-97.2 F (36.2 C)] 96.6  F (35.9 C) (02/07 0731) Pulse Rate:  [62-82] 62 (02/07 0600) Resp:  [18-42] 42 (02/07 0700) BP: (97-127)/(63-88) 113/69 (02/07 0700) SpO2:  [91 %-100 %] 92 % (02/07 0700) Weight:  [79.6 kg] 79.6 kg (02/07 0422) Last BM Date: 12/19/19  Weight change: Filed Weights   12/18/19 0500 12/19/19 0040 12/19/19 0422  Weight: 79 kg 79.6 kg 79.6 kg    Intake/Output:   Intake/Output Summary (Last 24 hours) at 12/19/2019 0902 Last data filed at 12/19/2019 0800 Gross per 24 hour  Intake 1196.9 ml  Output 2310 ml  Net -1113.1 ml      Physical Exam   General:  Elderly male. Weak appearing. Sitting up in bed . No resp difficulty HEENT: normal Neck: supple. no JVD. RIJ TLC Carotids 2+ bilat; no bruits. No lymphadenopathy or thryomegaly appreciated. Cor: PMI nondisplaced. Irregular rate & rhythm. No rubs, gallops or murmurs. + ports Lungs: clear Abdomen: soft, nontender, nondistended. No hepatosplenomegaly. No bruits or masses. Good bowel sounds. Extremities: no cyanosis, clubbing, rash, edema RFV Trialysis Neuro: alert & orientedx3, cranial nerves grossly intact. moves all 4 extremities w/o difficulty. Affect pleasant + expressive aphasia   Telemetry   A fib with PVCs  60s Personally reviewed   Labs   Basic Metabolic Panel: Recent Labs  Lab 12/15/19 0133 12/15/19 1447 12/16/19 0036 12/16/19 0444 12/17/19 0343 12/17/19 0343 12/17/19 0344 12/17/19 1617 12/17/19 1617 12/18/19 0406 12/18/19 1522 12/19/19 0425  NA 154*   < > 155*   < > 148*  --   --  143  --  148*  147* 143 143  K 3.6   < > 3.1*   < > 4.1  --   --  4.1  --  4.5  4.5 4.0 3.5  CL 111   < > 112*   < > 111  --   --  107  --  111  108 106 109  CO2 27   < > 27   < > 26  --    --  25  --  24  24 21* 21*  GLUCOSE 211*   < > 175*   < > 154*  --   --  130*  --  194*  201* 284* 118*  BUN 165*   < > 177*   < > 114*  --   --  105*  --  117*  121* 131* 121*  CREATININE 4.05*   < > 3.70*   < > 2.15*  --   --  2.13*  --  2.43*  2.52* 2.76* 2.48*  CALCIUM 8.9   < > 8.1*   < > 7.8*   < >  --  7.8*   < > 7.8*  8.2* 8.0* 7.1*  MG 2.7*  --  2.5*  --   --   --  2.1  --   --  2.4  --  2.1  PHOS  --   --   --    < > 4.3  --   --  5.4*  --  6.9* 7.3* 6.2*   < > = values in this interval not displayed.    Liver Function Tests: Recent Labs  Lab 12/13/19 0419 12/13/19 0419 12/15/19 0133 12/16/19 1332 12/17/19 0343 12/17/19 1617 12/18/19 0406 12/18/19 1522 12/19/19 0425  AST 223*  --  71*  --   --   --   --   --   --   ALT 1,175*  --  551*  --   --   --   --   --   --   ALKPHOS 87  --  115  --   --   --   --   --   --   BILITOT 2.0*  --  1.5*  --   --   --   --   --   --   PROT 4.8*  --  5.3*  --   --   --   --   --   --   ALBUMIN 2.1*   < > 2.1*   < > 1.8* 1.9* 1.9* 1.8* 1.5*   < > = values in this interval not displayed.   No results for input(s): LIPASE, AMYLASE in the last 168 hours. No results for input(s): AMMONIA in the last 168 hours.  CBC: Recent Labs  Lab 12/15/19 0857 12/15/19 0857 12/16/19 0444 12/16/19 1530 12/17/19 0344 12/18/19 0406 12/19/19 0425  WBC 10.2  --  8.0  --  7.7 8.7 9.7  HGB 12.3*  --  11.6*  --  11.4* 12.6* 11.2*  HCT 39.3  --  37.4*  --  36.7* 39.9 34.3*  MCV 87.7  --  88.4  --  87.8 85.8 82.7  PLT 50*   < > 45* 40* 43* 48* 48*   < > = values in this interval not displayed.    Cardiac Enzymes: No results for input(s): CKTOTAL, CKMB, CKMBINDEX, TROPONINI in the last 168 hours.  BNP: BNP (last 3 results) Recent Labs    12/09/19 0130  BNP 2,803.0*    ProBNP (last 3 results) No results for input(s): PROBNP in the last 8760 hours.   CBG: Recent Labs  Lab 12/18/19 1921 12/18/19 2114 12/18/19 2319 12/19/19 0325  12/19/19 0727  GLUCAP 220* 160* 162* 115* 114*    Coagulation Studies: Recent Labs    12/16/19 1530  LABPROT 19.5*  INR 1.7*     Imaging   No results found.   Medications:     Current Medications: . atorvastatin  10 mg Oral q1800  . B-complex with vitamin C  1 tablet Oral Daily  . chlorhexidine gluconate (MEDLINE KIT)  15 mL Mouth Rinse BID  . Chlorhexidine Gluconate Cloth  6 each Topical Daily  . dextrose  50 mL Intravenous Once  . insulin aspart  0-15 Units Subcutaneous Q4H  . pantoprazole (PROTONIX) IV  40 mg Intravenous Q12H  . sodium chloride flush  3 mL Intravenous Q12H    Infusions: . sodium chloride 5 mL/hr at 12/19/19 0800  . amiodarone 30 mg/hr (12/19/19 0800)  . cefUROXime (ZINACEF)  IV Stopped (12/19/19 0604)  . norepinephrine (LEVOPHED) Adult infusion Stopped (12/18/19 1229)    Assessment/Plan   1. Cardiogenic shock/ Septic Shock - EF 30-35% by echo (newly down) - Unclear etiology ? AF vs ischemia.  Compounded by IV diltiazem  - Blood CX 1/29 - No growth  - Respiratory Cx 1/29- moraxella - was on meropenum stopped 2/3 and switched to zinacef.  - Improved today. Off pressors. No co-ox drawn. Will recheck.  -  2. Acute systolic HF - management as above - ECHO 30-35%.  - Hold off on bb, arb, spiro for now.  - Off pressors. No co-ox drawn Will check - Volume status ok  - if recovers will need R/L cath but given AKI likely defer  -Has multi-system organ failure in setting of underlying multiple myeloma. Shock is mixed picture but cardiac output  remains low with newly reduced EF. Recheck  Co-ox. Not candidate for advanced HF therapies   3. Acute hypoxic respiratory failure - Extubated 2/5 - CCM on board - much improved  4. CAD - s/p previous PCIs - hs trop 30 -> 43 -> 43. Not c/w ACS. No change  5. AKI - Creatinine 1.3-> 2.1->3.77->3.44>4.05>>3.8>3.7 -> CVVHD -> 2.4 -> 2.4 - BUN 173 -> 121 -> 117 ->-> 121 - Due to ATN from shock - CVVHD  started 2/4. On hold now, Making urine - Renal following  - No current indication for further HD   6. Chronic A fib  - On on amio drip with NSVT and frequent PVCs  - On subcutaneous heparin and not IV heparin  - Platelets no nadired at 48k.  - If platelets recover can start heparin   7. Multiple myeloma - treated at [facility]  8. Dementia - unclear how severe this is  9. Thrombocytopenia - Platelets 133 on admit.  - Platelets trending down to 54>51>50>45-> 43 -> 48-> 48k - HIT negative  10. Hypernatremia - Improved with CVVHD - Receiving free water. Na 143 today  11. NSVT - Frequent PVCs and NSVT overnight.  - Improved with IV amio. Switch to 200 po bid  - Keep K>4.0 Mg > 2.0   12. DNR/DNI   Length of Stay: [place], MD  12/19/2019, 9:02 AM  Advanced Heart Failure Team Pager [phone] (M-F; 7a - 4p)  Please contact [facility] for night-coverage after hours (4p -7a ) and weekends on [email or web address]

## 2019-12-19 NOTE — Progress Notes (Signed)
NAME:  ADLEY MAZUROWSKI, MRN:  [id number], DOB:  02-01-1941, LOS: 10 ADMISSION DATE:  12/09/2019, CONSULTATION DATE:  12/09/18 REFERRING MD:  Bronson Ing  CHIEF COMPLAINT: Septic shock.  Brief History   Sisto Granillo is a 79 y/o male, with a PMH of aortic aneurysm, depression, bladder cancer, multiple myeloma, who presented from [facility] 1/28 with septic shock due to Moraxella PNA.   On 1/29 he developed shock and was intubated and placed levophed and dobutamine. He has been successfully weaned off vasopressors and sedation, but remained intubated due to encephalopathy.  Started CRRT (2/4) to see if that improves his mental status and helps him liberate from the vent.  Heparin is currently held 2/2 thrombocytopenia - will need to restart if platelets increase.  Past Medical History   Past Medical History:  Diagnosis Date  . Anemia   . Aortic aneurysm of unspecified site without mention of rupture   . Arthritis   . Bladder neck contracture   . Cancer ([place])   . Cerebral atherosclerosis    Carotid Doppler, 02/16/2013 - Bilateral Proximal ICAs,demonstrate mild plaque w/o evidence of significant diameter reduction, dissection, or any other vascular abnormality  . CHF (congestive heart failure) ([place])   . Complication of anesthesia   . COPD (chronic obstructive pulmonary disease) ([place])   . Coronary artery disease   . Depression   . Esophageal reflux   . Heart disease   . Heart murmur   . Hx of bladder cancer 10/07/2012  . Hyperlipidemia   . Hypertension   . 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 Intestinovesical fistula   . Kidney stones    history  . Lung mass   . Multiple myeloma   . Myocardial infarction [facility])    '96  . Non Hodgkin's lymphoma ([place])   . Paroxysmal atrial fibrillation ([place]) 01/02/2016  . Peripheral arterial disease ([place])   . Personal history of other diseases of circulatory system   . PONV  (postoperative nausea and vomiting)   . Prostate cancer ([place]) 2000  . Shingles   . Shortness of breath   . Sleep apnea    05-02-14 cpap , not yet used- suggested settings 5  . Stroke [facility]) 2013   Speech.     Significant Hospital Events   1/28 > Admission 1/29 > O/N Intubated 2/2 to cardiac shock. Pressures stabilizing.  2/1 > Successfully weaned off all vasopressors and sedation  Consults:  PCCM Nephrology HF   Procedures:  ETT 1/29 > 2/5 R radial art line 1/29 >  R IJ CVC 1/29 >   Significant Diagnostic Tests:  Echo 1/28 > EF 30-35%, mild LVH, mildly reduced RV systolic function.  Severely dilated LA, mod dilated RA.  Micro Data:  SARS CoV2 1/28 > neg. Flu 1/28 > neg. Blood 129 >> no growth at 2 days Respiratory 1/29 >> Moraxella catarrhalis, beta-lactamase positive >>   Antimicrobials:  Cefepime 1/29 >> 1/31 vanco 1/29 >> 1/31 Meropenem 1/31 >> 2/2 Cefuroxime 2/2 > 2/6 (full 7 day course)  Interim history/subjective:  Slow improvement since extubation.  Passed swallowing evaluation.  Denies chest pain or shortness of breath. Wife is requested no further dialysis or reintubation but is still awaiting transition to full comfort care.  Objective:  Blood pressure 104/66, pulse 63, temperature (!) 96.4 F (35.8 C), temperature source Axillary, resp. rate (!) 37, height '5\' 7"'$  (1.702 m), weight 79.6 kg, SpO2 97 %. CVP:  [  3 mmHg-7 mmHg] 5 mmHg      Intake/Output Summary (Last 24 hours) at 12/19/2019 1211 Last data filed at 12/19/2019 1100 Gross per 24 hour  Intake 1191.41 ml  Output 2435 ml  Net -1243.59 ml   Filed Weights   12/18/19 0500 12/19/19 0040 12/19/19 0422  Weight: 79 kg 79.6 kg 79.6 kg    Examination: General: Adult male, awake, encephalopathic Neuro: Responds to voice. Will follow commands, unable to answer orientation questions.  Perseverates on same topic HEENT: AT/[place] Cardiovascular: irregular rhythm, no murmurs, rubs appreciated. Warm and well  perfused Lungs: Clear with no signs of consolidation. Abdomen: Nondistended, non tender, soft Musculoskeletal: No deformities, cyanosis, or edema Skin:  no rashes  Assessment & Plan:   Metabolic Encephalopathy / Hypoactive delirium:  -Sedation stopped 2/1 -Remains encephalopathic this am -Delirium prevention measures -Delirium improved significantly once transferred off of the ICU  Acute renal failure due to shock, ATN, hypoperfusion state.   -No further plans for dialysis. -Monitor UOP today, continue supportive care   Was in multifactorial shock, cardiogenic and septic with associated Multi Organ Failure due to Moraxella pneumonia. -Now resolved  Moraxella pneumonia -Cefuroxime for full 7 day course, complete today -Continue to wean FiO2 for goal SpO2 > 92%  A. Fib with RVR:  Acute systolic heart failure CAD -Known history of  A Fib -converted to oral amiodarone  Thrombocytopenia secondary to sepsis/multiple myeloma.   -holding thromboprophylaxis  Multiple Myeloma:  -Lenalidomide held, on IVIG as an outpatient    Best Practice:  Diet: NPO. SLP recommends dysphagia 2 diet.  Family does not want feeding tube. Pain/Anxiety/Delirium protocol (if indicated): NA VAP protocol (if indicated): In place. DVT prophylaxis: SCD's  GI prophylaxis: PPI. Glucose control: Sliding-scale insulin moderate scale Mobility: Bedrest. Code Status: Limited.  No CPR, no escalation.  Cardioversion or defib for acute arrhythmia acceptable Family Communication: Plan for family meeting with palliative care to determine on going [facility] today. Disposition: ICU.  Update: Goals of care discussion with patient's wife. She understands her husband has been through a lot and does not want to cause any suffering. He remains on intermittent vasopressors and we are monitoring his renal recovery.  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  She  believes that his goal would be to get home, if he could not obtain this goal he would not want life prolonging measures. He is pending a SLP eval, we talked about if he were to fail SLP eval she does not want him to have a DHT/NGT.  She also does not believe that ongoing dialysis would not be in align with his wishes.  She does think that he is tired and does not want to cause any suffering, she would like to go home and talk to their kids about focusing on quality of life and not quantity.  She suspects they may have questions and would want to meet tomorrow.    Patient is ready for transfer.  Orders reconciled.  Hospital service contacted.  Labs   CBC: Recent Labs  Lab 12/15/19 0857 12/15/19 0857 12/16/19 0444 12/16/19 1530 12/17/19 0344 12/18/19 0406 12/19/19 0425  WBC 10.2  --  8.0  --  7.7 8.7 9.7  HGB 12.3*  --  11.6*  --  11.4* 12.6* 11.2*  HCT 39.3  --  37.4*  --  36.7* 39.9 34.3*  MCV 87.7  --  88.4  --  87.8 85.8  82.7  PLT 50*   < > 45* 40* 43* 48* 48*   < > = values in this interval not displayed.   Basic Metabolic Panel: Recent Labs  Lab 12/15/19 0133 12/15/19 1447 12/16/19 0036 12/16/19 0444 12/17/19 0343 12/17/19 0344 12/17/19 1617 12/18/19 0406 12/18/19 1522 12/19/19 0425  NA 154*   < > 155*   < > 148*  --  143 148*  147* 143 143  K 3.6   < > 3.1*   < > 4.1  --  4.1 4.5  4.5 4.0 3.5  CL 111   < > 112*   < > 111  --  107 111  108 106 109  CO2 27   < > 27   < > 26  --  '25 24  24 '$ 21* 21*  GLUCOSE 211*   < > 175*   < > 154*  --  130* 194*  201* 284* 118*  BUN 165*   < > 177*   < > 114*  --  105* 117*  121* 131* 121*  CREATININE 4.05*   < > 3.70*   < > 2.15*  --  2.13* 2.43*  2.52* 2.76* 2.48*  CALCIUM 8.9   < > 8.1*   < > 7.8*  --  7.8* 7.8*  8.2* 8.0* 7.1*  MG 2.7*  --  2.5*  --   --  2.1  --  2.4  --  2.1  PHOS  --   --   --    < > 4.3  --  5.4* 6.9* 7.3* 6.2*   < > = values in this interval not displayed.   GFR: Estimated Creatinine Clearance:  25.2 mL/min (A) (by C-G formula based on SCr of 2.48 mg/dL (H)). Recent Labs  Lab 12/16/19 0444 12/17/19 0344 12/18/19 0406 12/19/19 0425  WBC 8.0 7.7 8.7 9.7   Liver Function Tests: Recent Labs  Lab 12/13/19 0419 12/13/19 0419 12/15/19 0133 12/16/19 1332 12/17/19 0343 12/17/19 1617 12/18/19 0406 12/18/19 1522 12/19/19 0425  AST 223*  --  71*  --   --   --   --   --   --   ALT 1,175*  --  551*  --   --   --   --   --   --   ALKPHOS 87  --  115  --   --   --   --   --   --   BILITOT 2.0*  --  1.5*  --   --   --   --   --   --   PROT 4.8*  --  5.3*  --   --   --   --   --   --   ALBUMIN 2.1*   < > 2.1*   < > 1.8* 1.9* 1.9* 1.8* 1.5*   < > = values in this interval not displayed.   No results for input(s): LIPASE, AMYLASE in the last 168 hours. No results for input(s): AMMONIA in the last 168 hours. ABG    Component Value Date/Time   PHART 7.628 (HH) 12/11/2019 0707   PCO2ART 34.5 12/11/2019 0707   PO2ART 68.0 (L) 12/11/2019 0707   HCO3 36.1 (H) 12/11/2019 0707   TCO2 37 (H) 12/11/2019 0707   ACIDBASEDEF 19.0 (H) 12/10/2019 0419   O2SAT 51.2 12/19/2019 0934    Coagulation Profile: Recent Labs  Lab 12/16/19 1530  INR 1.7*   Cardiac Enzymes: No results  for input(s): CKTOTAL, CKMB, CKMBINDEX, TROPONINI in the last 168 hours. HbA1C: Hgb A1c MFr Bld  Date/Time Value Ref Range Status  12/10/2019 09:07 PM 5.6 4.8 - 5.6 % Final    Comment:    (NOTE) Pre diabetes:          5.7%-6.4% Diabetes:              >6.4% Glycemic control for   <7.0% adults with diabetes   01/08/2019 10:57 AM 5.6 4.8 - 5.6 % Final    Comment:    (NOTE) Pre diabetes:          5.7%-6.4% Diabetes:              >6.4% Glycemic control for   <7.0% adults with diabetes    CBG: Recent Labs  Lab 12/18/19 2114 12/18/19 2319 12/19/19 0325 12/19/19 0727 12/19/19 1124  GLUCAP 160* 162* 115* 114* 172*   35 min with greater than 50% of time in counseling and coordination of care.  Kipp Brood, MD [facility] ICU Physician [place]  Pager: [phone] Mobile: [phone] After hours: [phone].  12/19/2019, 12:15 PM

## 2019-12-20 DIAGNOSIS — L89301 Pressure ulcer of unspecified buttock, stage 1: Secondary | ICD-10-CM

## 2019-12-20 DIAGNOSIS — I5023 Acute on chronic systolic (congestive) heart failure: Secondary | ICD-10-CM

## 2019-12-20 DIAGNOSIS — Z8572 Personal history of non-Hodgkin lymphomas: Secondary | ICD-10-CM

## 2019-12-20 DIAGNOSIS — Z7189 Other specified counseling: Secondary | ICD-10-CM

## 2019-12-20 DIAGNOSIS — R7303 Prediabetes: Secondary | ICD-10-CM

## 2019-12-20 DIAGNOSIS — Z515 Encounter for palliative care: Secondary | ICD-10-CM

## 2019-12-20 DIAGNOSIS — C9001 Multiple myeloma in remission: Secondary | ICD-10-CM

## 2019-12-20 DIAGNOSIS — N1832 Chronic kidney disease, stage 3b: Secondary | ICD-10-CM

## 2019-12-20 LAB — CBC
HCT: 35.1 % — ABNORMAL LOW (ref 39.0–52.0)
Hemoglobin: 11.7 g/dL — ABNORMAL LOW (ref 13.0–17.0)
MCH: 27.3 pg (ref 26.0–34.0)
MCHC: 33.3 g/dL (ref 30.0–36.0)
MCV: 81.8 fL (ref 80.0–100.0)
Platelets: 57 10*3/uL — ABNORMAL LOW (ref 150–400)
RBC: 4.29 MIL/uL (ref 4.22–5.81)
RDW: 20.7 % — ABNORMAL HIGH (ref 11.5–15.5)
WBC: 8.5 10*3/uL (ref 4.0–10.5)
nRBC: 0 % (ref 0.0–0.2)

## 2019-12-20 LAB — BASIC METABOLIC PANEL
Anion gap: 19 — ABNORMAL HIGH (ref 5–15)
BUN: 125 mg/dL — ABNORMAL HIGH (ref 8–23)
CO2: 21 mmol/L — ABNORMAL LOW (ref 22–32)
Calcium: 8.1 mg/dL — ABNORMAL LOW (ref 8.9–10.3)
Chloride: 103 mmol/L (ref 98–111)
Creatinine, Ser: 2.73 mg/dL — ABNORMAL HIGH (ref 0.61–1.24)
GFR calc Af Amer: 25 mL/min — ABNORMAL LOW (ref >60)
GFR calc non Af Amer: 21 mL/min — ABNORMAL LOW (ref >60)
Glucose, Bld: 130 mg/dL — ABNORMAL HIGH (ref 70–99)
Potassium: 4 mmol/L (ref 3.5–5.1)
Sodium: 143 mmol/L (ref 135–145)

## 2019-12-20 LAB — GLUCOSE, CAPILLARY
Glucose-Capillary: 101 mg/dL — ABNORMAL HIGH (ref 70–99)
Glucose-Capillary: 130 mg/dL — ABNORMAL HIGH (ref 70–99)
Glucose-Capillary: 163 mg/dL — ABNORMAL HIGH (ref 70–99)
Glucose-Capillary: 166 mg/dL — ABNORMAL HIGH (ref 70–99)

## 2019-12-20 LAB — MAGNESIUM: Magnesium: 2.1 mg/dL (ref 1.7–2.4)

## 2019-12-20 MED ORDER — FENTANYL CITRATE (PF) 100 MCG/2ML IJ SOLN
25.0000 ug | INTRAMUSCULAR | Status: DC | PRN
  Administered 2019-12-20: 25 ug via INTRAVENOUS
  Filled 2019-12-20: qty 2

## 2019-12-20 NOTE — Progress Notes (Signed)
I responded to consult for pastoral support for Jeremy Parrish. He momentarily had his eyes open and closed them. He did not respond to me a looked like he was tired. I was present to offer him support with ministry of presence, silent prayer and words of comfort. I will follow up at a later time.   Palliative Care Resident Chaplain  Fidel Levy MA [phone]

## 2019-12-20 NOTE — Progress Notes (Signed)
Patient ID: Jeremy Parrish, male   DOB: 1941/07/27, 79 y.o.   MRN: [id number] [facility] Progress Note   Assessment/ Plan:   1. Acute kidney Injury on chronic kidney disease stage III (baseline creatinine 1.3-1.5): Appears to have suffered multifactorial acute kidney injury with cardiogenic shock/nephrotoxin exposure and transiently on CRRT for uremia between 2/4-2/5.  Nonoliguric overnight with some worsening of BUN/creatinine; no acute indications for dialysis today and temporary right IJ dialysis catheter removed yesterday.  We will continue to follow to decide on need for additional dialysis. 2.  Mixed septic/cardiogenic shock: Improving status post antibiotic therapy with transient pressor requirements. 3.  Moraxella pneumonia: Previous antibiotic therapy spectrum now narrowed down to oral cefdinir.  He remains afebrile and hemodynamically stable. 4.  Acute systolic heart failure: Appears to be euvolemic and with good response to CRRT/improving urine output with CVP of 5 noted yesterday.  Continue to follow off of diuretics at this time.  Subjective:   Complains of feeling cold and would like some warm blankets.   Objective:   BP 115/65   Pulse (!) 37   Temp (!) 96.4 F (35.8 C) (Axillary)   Resp (!) 29   Ht '5\' 7"'$  (1.702 m)   Wt 79.6 kg   SpO2 99%   BMI 27.49 kg/m   Intake/Output Summary (Last 24 hours) at 12/20/2019 0815 Last data filed at 12/19/2019 1300 Gross per 24 hour  Intake 308.84 ml  Output 325 ml  Net -16.16 ml   Weight change:   Physical Exam: Gen: Appears fatigued resting in bed, easy to awaken and engage in conversation CVS: Pulse irregular bradycardia, S1 and S2 normal Resp: Distant breath sounds bilaterally, no distinct rales/rhonchi.  Intact dressing over right IJ dialysis catheter removal site. Abd: Soft, flat, nontender Ext: No lower extremity edema.  Imaging: No results found.  Labs: BMET Recent Labs  Lab 12/16/19 1530 12/16/19 1530  12/16/19 2017 12/17/19 0343 12/17/19 1617 12/18/19 0406 12/18/19 1522 12/19/19 0425 12/20/19 0256  NA 153*   < > 151* 148* 143 148*  147* 143 143 143  K 3.3*   < > 3.5 4.1 4.1 4.5  4.5 4.0 3.5 4.0  CL 113*   < > 112* 111 107 111  108 106 109 103  CO2 27   < > '26 26 25 24  24 '$ 21* 21* 21*  GLUCOSE 195*   < > 177* 154* 130* 194*  201* 284* 118* 130*  BUN 158*   < > 140* 114* 105* 117*  121* 131* 121* 125*  CREATININE 3.08*   < > 2.72* 2.15* 2.13* 2.43*  2.52* 2.76* 2.48* 2.73*  CALCIUM 7.7*   < > 7.8* 7.8* 7.8* 7.8*  8.2* 8.0* 7.1* 8.1*  PHOS 4.9*  --  4.5 4.3 5.4* 6.9* 7.3* 6.2*  --    < > = values in this interval not displayed.   CBC Recent Labs  Lab 12/16/19 0444 12/16/19 0444 12/16/19 1530 12/17/19 0344 12/18/19 0406 12/19/19 0425  WBC 8.0  --   --  7.7 8.7 9.7  HGB 11.6*  --   --  11.4* 12.6* 11.2*  HCT 37.4*  --   --  36.7* 39.9 34.3*  MCV 88.4  --   --  87.8 85.8 82.7  PLT 45*   < > 40* 43* 48* 48*   < > = values in this interval not displayed.    Medications:    . amiodarone  200 mg Oral  BID  . atorvastatin  10 mg Oral q1800  . B-complex with vitamin C  1 tablet Oral Daily  . cefdinir  300 mg Oral Q12H  . chlorhexidine gluconate (MEDLINE KIT)  15 mL Mouth Rinse BID  . Chlorhexidine Gluconate Cloth  6 each Topical Daily  . dextrose  50 mL Intravenous Once  . insulin aspart  0-15 Units Subcutaneous TID WC  . sodium chloride flush  3 mL Intravenous Q12H      Elmarie Shiley, MD 12/20/2019, 8:15 AM

## 2019-12-20 NOTE — Plan of Care (Signed)

## 2019-12-20 NOTE — Progress Notes (Signed)
Palliative:  HPI: 79 yo male with PMH significant for aortic aneurysm, CAD, sick sinus syndrome, atrial fibrillation, COPD, depression, bladder cancer, multiple myeloma admitted from [facility] 12/09/19 with septic shock due to Moraxella pneumonia requiring transient intubation and CRRT. EF 30-35%.   I met today at Jeremy Parrish and his wife at bedside. He appears fatigued and short of breath. He does not participate in conversation but he does nod head appropriately when asked a question. We discussed that he has a slight worsening of renal function but having good urine output so far. She reports that he has been able to eat and drink well with assistance.   Clarified goals of care to continue with current therapies and they are hopeful for some improvement. They understand that he is high risk for further decline and they would NOT want escalation of care to ICU or aggressive measures (including dialysis) but would opt for comfort care. The focus of care is for comfort and quality of life.   I think he would be an ideal candidate for home hospice where he can be surrounded by all of his family. Wife shows me pictures of their great-grandchildren and they have a close and tight knot family in [place]. I believe they want to ensure that they have given Jeremy Parrish every opportunity to optimize his health before taking this step.   I will continue to follow and discuss.   Exam: Fatigued but alert/arouses to voice. HR atrial fibrillation 80s. Short of breath with labored breathing at rest. Abd flat. Generalized weakness.   Plan: - Continue with current level of care.  - Comfort focused care if declines.   U[place], NP Palliative Medicine Team Pager [phone] (Please see [email or web address] for schedule) Team Phone [phone]    Greater than 50%  of this time was spent counseling and coordinating care related to the above assessment and plan

## 2019-12-20 NOTE — TOC Initial Note (Signed)
Transition of Care [facility]) - Initial/Assessment Note    Patient Details  Name: Jeremy Parrish MRN: [id number] Date of Birth: 10-22-1941  Transition of [facility] CM/SW Contact:    Pollie Friar, RN Phone Number: 12/20/2019, 12:59 PM  Clinical Narrative:                 CM consulted to speak with family about having hospice consult them for questions they have. I called and spoke to patients spouse and answered a couple of her questions. She currently is not sure she is ready for hospice and wants to see how he does with therapies the next couple of days.  TOC following for either rehab vs hospice depending on progress.  Expected Discharge Plan: Skilled Nursing Facility Barriers to Discharge: Continued Medical Work up   Patient Goals and CMS Choice   CMS Medicare.gov Compare Post Acute Care list provided to:: Patient Represenative (must comment) Choice offered to / list presented to : Spouse  Expected Discharge Plan and Services Expected Discharge Plan: [place]   Discharge Planning Services: CM Consult   Living arrangements for the past 2 months: Single Family Home                                      Prior Living Arrangements/Services Living arrangements for the past 2 months: Single Family Home Lives with:: Spouse Patient language and need for interpreter reviewed:: Yes        Need for Family Participation in Patient Care: Yes (Comment) Care giver support system in place?: Yes (comment)   Criminal Activity/Legal Involvement Pertinent to Current Situation/Hospitalization: No - Comment as needed  Activities of Daily Living Home Assistive Devices/Equipment: None ADL Screening (condition at time of admission) Patient's cognitive ability adequate to safely complete daily activities?: Yes Is the patient deaf or have difficulty hearing?: No Does the patient have difficulty seeing, even when wearing glasses/contacts?: No Does the patient have  difficulty concentrating, remembering, or making decisions?: No Patient able to express need for assistance with ADLs?: Yes Does the patient have difficulty dressing or bathing?: No Independently performs ADLs?: Yes (appropriate for developmental age) Does the patient have difficulty walking or climbing stairs?: No Weakness of Legs: None Weakness of Arms/Hands: None  Permission Sought/Granted                  Emotional Assessment           Psych Involvement: No (comment)  Admission diagnosis:  Atrial fibrillation with RVR ([place]) [I48.91] History of chronic CHF [Z86.79] Acute dyspnea [R06.00] Patient Active Problem List   Diagnosis Date Noted  . Palliative care by specialist   . Goals of care, counseling/discussion   . Encounter for central line placement   . Acute renal failure superimposed on chronic kidney disease ([place])   . Pressure injury of skin 12/14/2019  . Shock ([place])   . Acute respiratory failure with hypoxia ([place])   . Cardiogenic shock ([place])   . Acute exacerbation of CHF (congestive heart failure) ([place]) 12/09/2019  . Atrial fibrillation with RVR ([place]) 12/09/2019  . Abdominal bloating 06/22/2019  . Other persistent atrial fibrillation ([place])   . History of colonic polyps 05/25/2018  . CAP (community acquired pneumonia) 03/11/2018  . COPD with acute exacerbation ([place]) 03/11/2018  . Vitamin B12 deficiency   . Long term current use of anticoagulant 12/25/2016  . COPD (chronic  obstructive pulmonary disease) ([place]) 04/18/2016  . Paroxysmal atrial fibrillation ([place]) 01/02/2016  . Hypokalemia 11/03/2014  . URI (upper respiratory infection) 11/03/2014  . Weakness 11/03/2014  . Obstructive sleep apnea 08/07/2014  . Central sleep apnea 08/07/2014  . Inguinal hernia 05/04/2014  . Chest pain with moderate risk for cardiac etiology 11/25/2013  . Fever 09/13/2013  . Pneumonia 09/13/2013  . Pancytopenia ([place]) 09/13/2013  . Muscle weakness (generalized) 08/04/2013  .  Status post arthroscopy of shoulder 08/04/2013  . Pain in joint, shoulder region 08/04/2013  . Rotator cuff tear arthropathy of right shoulder 07/21/2013  . Left rotator cuff tear arthropathy 07/21/2013  . Shortness of breath 07/07/2013  . 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 Diarrhea 12/14/2012  . Hyperlipidemia with target LDL less than 100 10/08/2012  . Prediabetes 10/08/2012  . Expressive aphasia 10/08/2012  . Hemiplegia affecting right dominant side ([place]) 10/08/2012  . 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 Hx of bladder cancer 10/07/2012  . 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 ASTHMA, UNSPECIFIED 03/22/2010  . Nonspecific (abnormal) findings on radiological and other examination of body structure 03/22/2010  . Hx of lymphoma 03/21/2010  . Multiple myeloma in remission ([place]) 03/21/2010  . Anxiety state 03/21/2010  . Essential hypertension 03/21/2010  . MYOCARDIAL INFARCTION 03/21/2010  . Coronary atherosclerosis 03/21/2010  . NEPHROLITHIASIS 03/21/2010  . ELEVATED PROSTATE SPECIFIC ANTIGEN 03/21/2010  . ROTATOR CUFF REPAIR, RIGHT, HX OF 03/21/2010   PCP:  Celene Squibb, MD Pharmacy:   [id number] [place] [address]: [phone]: [address] [place]Phone: [phone]: [phone] [facility] [address]Phone: [phone]: [phone]    Social Determinants of Health (SDOH) Interventions    Readmission Risk Interventions No flowsheet data found.

## 2019-12-20 NOTE — Progress Notes (Signed)
Pt's wife refuses to keep pt's boots on; heels floated on pillow - pt's wife also refused to leave. She left after discussion.

## 2019-12-20 NOTE — Progress Notes (Signed)
Advanced Heart Failure Team Rounding Note   Primary Physician: Celene Squibb, MD PCP-Cardiologist:  Sanda Klein, MD   Requesting: Dr. Jacinta Shoe    HPI:    Extubated 2/5. On [place] 6L/min. PCCM signed off.    Nonoliguric but UOP appears to be slowing. Only 55cc in UOP yesterday. Scr/BUN rising, 2.48>>2.73, 121>>125.      No complaints. Resting comfortably. Denies pain.   Past Medical History: Past Medical History:  Diagnosis Date  . Anemia   . Aortic aneurysm of unspecified site without mention of rupture   . Arthritis   . Bladder neck contracture   . Cancer ([place])   . Cerebral atherosclerosis    Carotid Doppler, 02/16/2013 - Bilateral Proximal ICAs,demonstrate mild plaque w/o evidence of significant diameter reduction, dissection, or any other vascular abnormality  . CHF (congestive heart failure) ([place])   . Complication of anesthesia   . COPD (chronic obstructive pulmonary disease) ([place])   . Coronary artery disease   . Depression   . Esophageal reflux   . Heart disease   . Heart murmur   . Hx of bladder cancer 10/07/2012  . Hyperlipidemia   . Hypertension   . 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 Intestinovesical fistula   . Kidney stones    history  . Lung mass   . Multiple myeloma   . Myocardial infarction [facility])    '96  . Non Hodgkin's lymphoma ([place])   . Paroxysmal atrial fibrillation ([place]) 01/02/2016  . Peripheral arterial disease ([place])   . Personal history of other diseases of circulatory system   . PONV (postoperative nausea and vomiting)   . Prostate cancer ([place]) 2000  . Shingles   . Shortness of breath   . Sleep apnea    05-02-14 cpap , not yet used- suggested settings 5  . Stroke [facility]) 2013   Speech.    Past Surgical History: Past Surgical History:  Procedure Laterality Date  . BLADDER SURGERY    . BONE MARROW TRANSPLANT  2011  . CARDIOVERSION N/A 06/02/2019   Procedure:  CARDIOVERSION;  Surgeon: Sanda Klein, MD;  Location: [facility] ENDOSCOPY;  Service: Cardiovascular;  Laterality: N/A;  . COLON SURGERY     colon resection  . COLONOSCOPY N/A 01/01/2013   Procedure: COLONOSCOPY;  Surgeon: Rogene Houston, MD;  Location: [facility]O SUITE;  Service: Endoscopy;  Laterality: N/A;  825-moved to [place] notified pt  . COLONOSCOPY N/A 07/16/2018   Procedure: COLONOSCOPY;  Surgeon: Rogene Houston, MD;  Location: [facility] ENDO SUITE;  Service: Endoscopy;  Laterality: N/A;  1:25  . CORONARY ANGIOPLASTY  06/24/2000   PCI and stenting in mid & proximal RCA  . heart stents x 5  1999  . INGUINAL HERNIA REPAIR Right 05/04/2014   Procedure: OPEN RIGHT INGUINAL HERNIA REPAIR with mesh;  Surgeon: Edward Jolly, MD;  Location: [facility] ORS;  Service: General;  Laterality: Right;  . INSERT / REPLACE / REMOVE PACEMAKER    . left ear skin cancer removed    . NM MYOCAR PERF WALL MOTION  11/27/2007   inferior scar  . PACEMAKER INSERTION  07/22/2011   Medtronic  . POLYPECTOMY  07/16/2018   Procedure: POLYPECTOMY;  Surgeon: Rogene Houston, MD;  Location: [facility];  Service: Endoscopy;;  colon  . PORTACATH PLACEMENT  07/26/2009   right chest  . PROSTATE SURGERY    . Rotator    .  ROTATOR CUFF REPAIR Right   . SHOULDER ARTHROSCOPY WITH SUBACROMIAL DECOMPRESSION Right 07/21/2013   Procedure: RIGHT SHOULDER ARTHROSCOPY WITH SUBACROMIAL DECOMPRESSION AND DEBRIDEMENT & Injection of Left Shoulder;  Surgeon: Alta Corning, MD;  Location: [place];  Service: Orthopedics;  Laterality: Right;  . TEE WITHOUT CARDIOVERSION  10/13/2012   Procedure: TRANSESOPHAGEAL ECHOCARDIOGRAM (TEE);  Surgeon: Sanda Klein, MD;  Location: [name] ENDOSCOPY;  Service: Cardiovascular;  Laterality: N/A;  pat/kay/echo notified  . US ECHOCARDIOGRAPHY  06/19/2011   RV mildly dilated,mild to mod. MR,mild AI,mild PI  . WRIST SURGERY     right    Family History: Family History  Problem Relation Age of Onset  . Cancer Father         bladder  . Heart disease Father        before age 65  . Hypertension Mother   . Cancer Brother   . Heart disease Brother        before age 60  . Heart disease Sister        before age 66  . Hyperlipidemia Sister   . Hypertension Sister   . Heart attack Sister   . Colon cancer Neg Hx   . Colon polyps Neg Hx     Social History: Social History   Socioeconomic History  . Marital status: Married    Spouse name: [name]  . Number of children: 3  . Years of education: 9th  . Highest education level: Not on file  Occupational History  . Occupation: retired   Tobacco Use  . Smoking status: Former Smoker    Packs/day: 1.00    Years: 20.00    Pack years: 20.00    Types: Cigarettes    Quit date: 11/14/1994    Years since quitting: 25.1  . Smokeless tobacco: Never Used  Substance and Sexual Activity  . Alcohol use: No    Alcohol/week: 0.0 standard drinks    Comment: previously drank but none for at least 15 years.  . Drug use: No  . Sexual activity: Not on file  Other Topics Concern  . Not on file  Social History Narrative   Patient lives at home spouse.   Caffeine Use: Occasionally   Social Determinants of Health   Financial Resource Strain:   . Difficulty of Paying Living Expenses: Not on file  Food Insecurity:   . Worried About Charity fundraiser in the Last Year: Not on file  . Ran Out of Food in the Last Year: Not on file  Transportation Needs:   . Lack of Transportation (Medical): Not on file  . Lack of Transportation (Non-Medical): Not on file  Physical Activity:   . Days of Exercise per Week: Not on file  . Minutes of Exercise per Session: Not on file  Stress:   . Feeling of Stress : Not on file  Social Connections:   . Frequency of Communication with Friends and Family: Not on file  . Frequency of Social Gatherings with Friends and Family: Not on file  . Attends Religious Services: Not on file  . Active Member of Clubs or Organizations: Not on file  .  Attends Archivist Meetings: Not on file  . Marital Status: Not on file    Allergies:  Allergies  Allergen Reactions  . Morphine And Related Other (See Comments)    hallucinations  . Tape Rash    Paper tape is ok    Objective:    Vital Signs:  Temp:  [96.4 F (35.8 C)-97.5 F (36.4 C)] 97.5 F (36.4 C) (02/08 0817) Pulse Rate:  [37-71] 62 (02/08 0817) Resp:  [18-37] 20 (02/08 0817) BP: (101-127)/(62-76) 127/76 (02/08 0817) SpO2:  [91 %-99 %] 96 % (02/08 0817) Last BM Date: 12/19/19  Weight change: Filed Weights   12/18/19 0500 12/19/19 0040 12/19/19 0422  Weight: 79 kg 79.6 kg 79.6 kg    Intake/Output:   Intake/Output Summary (Last 24 hours) at 12/20/2019 1056 Last data filed at 12/20/2019 2706 Gross per 24 hour  Intake 265.59 ml  Output 700 ml  Net -434.41 ml     Physical Exam    PHYSICAL EXAM: General:  Chronically ill/ weak appearing WM using [place] No respiratory difficulty HEENT: normal Neck: supple. Rt IJ CVC removed and site bandaged, no JVD. Carotids 2+ bilat; no bruits. No lymphadenopathy or thyromegaly appreciated. Cor: PMI nondisplaced. Irregular rhythm, regular rate. No rubs, gallops or murmurs. + rt sided chest port Lungs: diffuse rhonchi R>L, no rales or wheezing  Abdomen: soft, nontender, nondistended. No hepatosplenomegaly. No bruits or masses. Good bowel sounds. Extremities: no cyanosis, clubbing, rash, edema, bilateral SCDs Neuro: alert & oriented x 3, cranial nerves grossly intact. moves all 4 extremities w/o difficulty. Affect pleasant.   Telemetry   N/A  Labs   Basic Metabolic Panel: Recent Labs  Lab 12/16/19 0036 12/16/19 0444 12/17/19 0343 12/17/19 0343 12/17/19 0344 12/17/19 1617 12/17/19 1617 12/18/19 0406 12/18/19 0406 12/18/19 1522 12/19/19 0425 12/20/19 0256  NA 155*   < > 148*   < >  --  143  --  148*  147*  --  143 143 143  K 3.1*   < > 4.1   < >  --  4.1  --  4.5  4.5  --  4.0 3.5 4.0  CL 112*   < > 111    < >  --  107  --  111  108  --  106 109 103  CO2 27   < > 26   < >  --  25  --  24  24  --  21* 21* 21*  GLUCOSE 175*   < > 154*   < >  --  130*  --  194*  201*  --  284* 118* 130*  BUN 177*   < > 114*   < >  --  105*  --  117*  121*  --  131* 121* 125*  CREATININE 3.70*   < > 2.15*   < >  --  2.13*  --  2.43*  2.52*  --  2.76* 2.48* 2.73*  CALCIUM 8.1*   < > 7.8*   < >  --  7.8*   < > 7.8*  8.2*   < > 8.0* 7.1* 8.1*  MG 2.5*  --   --   --  2.1  --   --  2.4  --   --  2.1 2.1  PHOS  --    < > 4.3  --   --  5.4*  --  6.9*  --  7.3* 6.2*  --    < > = values in this interval not displayed.    Liver Function Tests: Recent Labs  Lab 12/15/19 0133 12/16/19 1332 12/17/19 0343 12/17/19 1617 12/18/19 0406 12/18/19 1522 12/19/19 0425  AST 71*  --   --   --   --   --   --   ALT 551*  --   --   --   --   --   --  ALKPHOS 115  --   --   --   --   --   --   BILITOT 1.5*  --   --   --   --   --   --   PROT 5.3*  --   --   --   --   --   --   ALBUMIN 2.1*   < > 1.8* 1.9* 1.9* 1.8* 1.5*   < > = values in this interval not displayed.   No results for input(s): LIPASE, AMYLASE in the last 168 hours. No results for input(s): AMMONIA in the last 168 hours.  CBC: Recent Labs  Lab 12/15/19 0857 12/15/19 0857 12/16/19 0444 12/16/19 1530 12/17/19 0344 12/18/19 0406 12/19/19 0425  WBC 10.2  --  8.0  --  7.7 8.7 9.7  HGB 12.3*  --  11.6*  --  11.4* 12.6* 11.2*  HCT 39.3  --  37.4*  --  36.7* 39.9 34.3*  MCV 87.7  --  88.4  --  87.8 85.8 82.7  PLT 50*   < > 45* 40* 43* 48* 48*   < > = values in this interval not displayed.    Cardiac Enzymes: No results for input(s): CKTOTAL, CKMB, CKMBINDEX, TROPONINI in the last 168 hours.  BNP: BNP (last 3 results) Recent Labs    12/09/19 0130  BNP 2,803.0*    ProBNP (last 3 results) No results for input(s): PROBNP in the last 8760 hours.   CBG: Recent Labs  Lab 12/19/19 0325 12/19/19 0727 12/19/19 1124 12/19/19 1657  12/20/19 0813  GLUCAP 115* 114* 172* 147* 101*    Coagulation Studies: No results for input(s): LABPROT, INR in the last 72 hours.   Imaging   No results found.   Medications:     Current Medications: . amiodarone  200 mg Oral BID  . atorvastatin  10 mg Oral q1800  . B-complex with vitamin C  1 tablet Oral Daily  . cefdinir  300 mg Oral Q12H  . chlorhexidine gluconate (MEDLINE KIT)  15 mL Mouth Rinse BID  . Chlorhexidine Gluconate Cloth  6 each Topical Daily  . dextrose  50 mL Intravenous Once  . insulin aspart  0-15 Units Subcutaneous TID WC  . sodium chloride flush  3 mL Intravenous Q12H    Infusions: . sodium chloride Stopped (12/19/19 1112)    Assessment/Plan   1. Cardiogenic shock/ Septic Shock - EF 30-35% by echo (newly down) - Unclear etiology ? AF vs ischemia.  Compounded by IV diltiazem  - Blood CX 1/29 - No growth  - Respiratory Cx 1/29- moraxella - was on meropenum stopped 2/3 and switched to zinacef. Completed 7 day course. Now on cefdinir, day 2/5.  - Improved. Off pressors w/ co-ox remaining stable x 72 hrs at 51% (last checked 2/7).    2. Acute systolic HF - management as above - ECHO 30-35%.  - Hold off on bb, arb, spiro for now due to recent shock + AKI - hold off on hydralazine + nitrate to allow more BP for renal perfusion   - Off pressors. Co-ox 51% yesterday - Volume status ok. Will check wt again today and follow daily.   - if recovers will need R/L cath but given AKI likely defer  -Has multi-system organ failure in setting of underlying multiple myeloma. Shock is mixed picture but cardiac output remains low with newly reduced EF. Not candidate for advanced HF therapies   3. Acute hypoxic respiratory  failure - Extubated 2/5, much improved - CCM has signed of  4. CAD - s/p previous PCIs - hs trop 30 -> 43 -> 43. Not c/w ACS. No change  5. AKI - Creatinine 1.3-> 2.1->3.77->3.44>4.05>>3.8>3.7 -> CVVHD -> 2.4 -> 2.4->2.73 - BUN 173 ->  121 -> 117 ->-> 121-->125 - Due to ATN from shock - CVVHD started 2/4. UOP slowing, only 555 cc out yesterday  - Nephrology following   - No current indication for further HD.  - Per nephrology, will continue today and will decide on need for additional dialysis   6. Chronic A fib  - continue PO amiodarone 200 bid  - a/c currently on hold  - Platelets nadired at 48k yesterday.  - Will repeat CBC. If platelets recover can start heparin   7. Multiple myeloma - [facility] at [facility]  8. Dementia - unclear how severe this is  9. Thrombocytopenia - Platelets 133 on admit.  - Platelets trending down to 54>51>50>45-> 43 -> 48-> 48k - HIT negative - repeat CBC today   10. Hypernatremia - Improved with CVVHD - Receiving free water. Na 143 today  11. NSVT - Has had frequent PVCs and NSVT this admit. Tele not ordered on transfer out of icu.  - will order telemetry for monitoring - continue amiodarone 200 po bid  - Keep K>4.0 Mg > 2.0   12. DNR/DNI   Length of Stay: [address], PA-C  12/20/2019, 10:56 AM  Advanced Heart Failure Team Pager [phone] (M-F; 7a - 4p)  Please contact [facility] for night-coverage after hours (4p -7a ) and weekends on [email or web address]

## 2019-12-20 NOTE — Progress Notes (Addendum)
  Speech Language Pathology Treatment: Dysphagia  Patient Details Name: Jeremy Parrish MRN: [id number] DOB: 11-Oct-1941 Today's Date: 12/20/2019 Time: 9390-3009 SLP Time Calculation (min) (ACUTE ONLY): 31 min  Assessment / Plan / Recommendation Clinical Impression  Patient received in bed, HOB raised, receiving oxygen via nasal cannula. Wife present throughout session and able to provide pertinent history. Patient with extended recent intubation. Voicing noted. However, patient hoarse, dysphonic with low vocal volume. Wife reports patient tolerating regular/thin liquid diet PTA.   Patient confused, able to follow approximately 50% of simple verbal commands. Pt seen with ice chips: prolonged oral phase, spontaneous double swallow, no overt s/sx aspiration. Pt seen with small sips of thin liquid (water), ?delayed swallow initiation, immediate congested coughing x1, delayed (weak) cough x3. Pt seen with cup sips of NTL: no overt s/sx aspiration. With puree solids (applesauce), pt with minimally prolonged oral phase, adequate swallow initiation and no overt s/sx aspiration. With dysphagia 2 solids (eggs), patient with prolonged mastication prior to initiation of the swallow; mild diffuse residue that cleared with subsequent (dry) swallow. No overt s/sx aspiration.   Recommend dysphagia 2 solids/NTL: sit upright for all PO intake, alternate solids/liquids, small bites/sips, frequent oral care. Meds crushed in puree. 1-2 ice chips okay following thorough oral care if patient upright/alert. Recommend oral suction be set up at bedside.  MBSS recommended in next day or so to objectively assess swallow function. Wife educated and agreeable.    Wife and RN educated re: compensatory strategies and aspiration precautions. RN and wife verbalized understanding and was was observed giving patient small cup sips of NTL adequately.   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 Pt intubated 1/29-2/5. CRRT 2/4-2/5.      SLP Plan  Continue with current plan of care;MBS       Recommendations  Diet recommendations: Dysphagia 2 (fine chop);Nectar-thick liquid Liquids provided via: Cup;Teaspoon Medication Administration: Crushed with puree Supervision: Full supervision/cueing for compensatory strategies Compensations: Minimize environmental distractions;Slow rate;Small sips/bites;Multiple dry swallows after each bite/sip Postural Changes and/or Swallow Maneuvers: Seated upright 90 degrees;Upright 30-60 min after meal                Oral Care Recommendations: Oral care QID;Oral care prior to ice chip/H20 Follow up Recommendations: Skilled Nursing facility;Home health SLP SLP Visit Diagnosis: Dysphagia, oropharyngeal phase (R13.12) Plan: Continue with current plan of care;MBS       [place], M.Ed., CCC-SLP Speech Therapy Acute Rehabilitation [phone]: Acute Rehab office [phone] - pager    Jeremy Parrish 12/20/2019, 10:46 AM

## 2019-12-20 NOTE — Progress Notes (Signed)
Advanced Heart Failure Team Rounding Note   Primary Physician: Celene Squibb, MD PCP-Cardiologist:  Sanda Klein, MD   Requesting: Dr. Jacinta Shoe    HPI:    Extubated 2/5. On [place] 6L/min. PCCM signed off.    Nonoliguric but UOP appears to be slowing. Only 55cc in UOP yesterday. Scr/BUN rising, 2.48>>2.73, 121>>125.      No complaints. Resting comfortably. Denies pain.   Past Medical History: Past Medical History:  Diagnosis Date  . Anemia   . Aortic aneurysm of unspecified site without mention of rupture   . Arthritis   . Bladder neck contracture   . Cancer ([place])   . Cerebral atherosclerosis    Carotid Doppler, 02/16/2013 - Bilateral Proximal ICAs,demonstrate mild plaque w/o evidence of significant diameter reduction, dissection, or any other vascular abnormality  . CHF (congestive heart failure) ([place])   . Complication of anesthesia   . COPD (chronic obstructive pulmonary disease) ([place])   . Coronary artery disease   . Depression   . Esophageal reflux   . Heart disease   . Heart murmur   . Hx of bladder cancer 10/07/2012  . Hyperlipidemia   . Hypertension   . 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 Intestinovesical fistula   . Kidney stones    history  . Lung mass   . Multiple myeloma   . Myocardial infarction [facility])    '96  . Non Hodgkin's lymphoma ([place])   . Paroxysmal atrial fibrillation ([place]) 01/02/2016  . Peripheral arterial disease ([place])   . Personal history of other diseases of circulatory system   . PONV (postoperative nausea and vomiting)   . Prostate cancer ([place]) 2000  . Shingles   . Shortness of breath   . Sleep apnea    05-02-14 cpap , not yet used- suggested settings 5  . Stroke [facility]) 2013   Speech.    Past Surgical History: Past Surgical History:  Procedure Laterality Date  . BLADDER SURGERY    . BONE MARROW TRANSPLANT  2011  . CARDIOVERSION N/A 06/02/2019   Procedure:  CARDIOVERSION;  Surgeon: Sanda Klein, MD;  Location: [facility] ENDOS[facility];  Service: Cardiovascular;  Laterality: N/A;  . COLON SURGERY     colon resection  . COLONOSCOPY N/A 01/01/2013   Procedure: COLONOSCOPY;  Surgeon: Rogene Houston, MD;  Location: [facility] ENDO SUITE;  Service: Endoscopy;  Laterality: N/A;  825-moved to [place] notified pt  . COLONOSCOPY N/A 07/16/2018   Procedure: COLONOSCOPY;  Surgeon: Rogene Houston, MD;  Location: [facility] END[facility];  Service: Endoscopy;  Laterality: N/A;  1:25  . CORONARY ANGIOPLASTY  06/24/2000   PCI and stenting in mid & proximal RCA  . heart stents x 5  1999  . INGUINAL HERNIA REPAIR Right 05/04/2014   Procedure: OPEN RIGHT INGUINAL HERNIA REPAIR with mesh;  Surgeon: Edward Jolly, MD;  Location: [facility] ORS;  Service: General;  Laterality: Right;  . INSERT / REPLACE / REMOVE PACEMAKER    . left ear skin cancer removed    . NM MYOCAR PERF WALL MOTION  11/27/2007   inferior scar  . PACEMAKER INSERTION  07/22/2011   [employer]  . POLYPECTOMY  07/16/2018   Procedure: POLYPECTOMY;  Surgeon: Rogene Houston, MD;  Location: [facility] END[facility];  Service: Endoscopy;;  colon  . PORTACATH PLACEMENT  07/26/2009   right chest  . PROSTATE SURGERY    . Rotator    .  ROTATOR CUFF REPAIR Right   . SHOULDER ARTHROSCOPY WITH SUBACROMIAL DECOMPRESSION Right 07/21/2013   Procedure: RIGHT SHOULDER ARTHROSCOPY WITH SUBACROMIAL DECOMPRESSION AND DEBRIDEMENT & Injection of Left Shoulder;  Surgeon: Alta Corning, MD;  Location: [place];  Service: Orthopedics;  Laterality: Right;  . TEE WITHOUT CARDIOVERSION  10/13/2012   Procedure: TRANSESOPHAGEAL ECHOCARDIOGRAM (TEE);  Surgeon: Sanda Klein, MD;  Location: [name] ENDOSCOPY;  Service: Cardiovascular;  Laterality: N/A;  pat/kay/echo notified  . US ECHOCARDIOGRAPHY  06/19/2011   RV mildly dilated,mild to mod. MR,mild AI,mild PI  . WRIST SURGERY     right    Family History: Family History  Problem Relation Age of Onset  . Cancer Father         bladder  . Heart disease Father        before age 65  . Hypertension Mother   . Cancer Brother   . Heart disease Brother        before age 60  . Heart disease Sister        before age 66  . Hyperlipidemia Sister   . Hypertension Sister   . Heart attack Sister   . Colon cancer Neg Hx   . Colon polyps Neg Hx     Social History: Social History   Socioeconomic History  . Marital status: Married    Spouse name: [name]  . Number of children: 3  . Years of education: 9th  . Highest education level: Not on file  Occupational History  . Occupation: retired   Tobacco Use  . Smoking status: Former Smoker    Packs/day: 1.00    Years: 20.00    Pack years: 20.00    Types: Cigarettes    Quit date: 11/14/1994    Years since quitting: 25.1  . Smokeless tobacco: Never Used  Substance and Sexual Activity  . Alcohol use: No    Alcohol/week: 0.0 standard drinks    Comment: previously drank but none for at least 15 years.  . Drug use: No  . Sexual activity: Not on file  Other Topics Concern  . Not on file  Social History Narrative   Patient lives at home spouse.   Caffeine Use: Occasionally   Social Determinants of Health   Financial Resource Strain:   . Difficulty of Paying Living Expenses: Not on file  Food Insecurity:   . Worried About Charity fundraiser in the Last Year: Not on file  . Ran Out of Food in the Last Year: Not on file  Transportation Needs:   . Lack of Transportation (Medical): Not on file  . Lack of Transportation (Non-Medical): Not on file  Physical Activity:   . Days of Exercise per Week: Not on file  . Minutes of Exercise per Session: Not on file  Stress:   . Feeling of Stress : Not on file  Social Connections:   . Frequency of Communication with Friends and Family: Not on file  . Frequency of Social Gatherings with Friends and Family: Not on file  . Attends Religious Services: Not on file  . Active Member of Clubs or Organizations: Not on file  .  Attends Archivist Meetings: Not on file  . Marital Status: Not on file    Allergies:  Allergies  Allergen Reactions  . Morphine And Related Other (See Comments)    hallucinations  . Tape Rash    Paper tape is ok    Objective:    Vital Signs:  Temp:  [97.5 F (36.4 C)] 97.5 F (36.4 C) (02/08 0817) Pulse Rate:  [37-63] 62 (02/08 0817) Resp:  [20-29] 20 (02/08 0817) BP: (115-127)/(65-76) 127/76 (02/08 0817) SpO2:  [96 %-99 %] 96 % (02/08 0817) Last BM Date: 12/19/19  Weight change: Filed Weights   12/18/19 0500 12/19/19 0040 12/19/19 0422  Weight: 79 kg 79.6 kg 79.6 kg    Intake/Output:   Intake/Output Summary (Last 24 hours) at 12/20/2019 1442 Last data filed at 12/20/2019 1440 Gross per 24 hour  Intake --  Output 1250 ml  Net -1250 ml     Physical Exam    PHYSICAL EXAM: General:  Chronically ill/ weak appearing WM using [place] No respiratory difficulty HEENT: normal Neck: supple. Rt IJ CVC removed and site bandaged, no JVD. Carotids 2+ bilat; no bruits. No lymphadenopathy or thyromegaly appreciated. Cor: PMI nondisplaced. Irregular rhythm, regular rate. No rubs, gallops or murmurs. + rt sided chest port Lungs: diffuse rhonchi R>L, no rales or wheezing  Abdomen: soft, nontender, nondistended. No hepatosplenomegaly. No bruits or masses. Good bowel sounds. Extremities: no cyanosis, clubbing, rash, edema, bilateral SCDs Neuro: alert & oriented x 3, cranial nerves grossly intact. moves all 4 extremities w/o difficulty. Affect pleasant.   Telemetry   N/A  Labs   Basic Metabolic Panel: Recent Labs  Lab 12/16/19 0036 12/16/19 0444 12/17/19 0343 12/17/19 0343 12/17/19 0344 12/17/19 1617 12/17/19 1617 12/18/19 0406 12/18/19 0406 12/18/19 1522 12/19/19 0425 12/20/19 0256  NA 155*   < > 148*   < >  --  143  --  148*  147*  --  143 143 143  K 3.1*   < > 4.1   < >  --  4.1  --  4.5  4.5  --  4.0 3.5 4.0  CL 112*   < > 111   < >  --  107  --  111   108  --  106 109 103  CO2 27   < > 26   < >  --  25  --  24  24  --  21* 21* 21*  GLUCOSE 175*   < > 154*   < >  --  130*  --  194*  201*  --  284* 118* 130*  BUN 177*   < > 114*   < >  --  105*  --  117*  121*  --  131* 121* 125*  CREATININE 3.70*   < > 2.15*   < >  --  2.13*  --  2.43*  2.52*  --  2.76* 2.48* 2.73*  CALCIUM 8.1*   < > 7.8*   < >  --  7.8*   < > 7.8*  8.2*   < > 8.0* 7.1* 8.1*  MG 2.5*  --   --   --  2.1  --   --  2.4  --   --  2.1 2.1  PHOS  --    < > 4.3  --   --  5.4*  --  6.9*  --  7.3* 6.2*  --    < > = values in this interval not displayed.    Liver Function Tests: Recent Labs  Lab 12/15/19 0133 12/16/19 1332 12/17/19 0343 12/17/19 1617 12/18/19 0406 12/18/19 1522 12/19/19 0425  AST 71*  --   --   --   --   --   --   ALT 551*  --   --   --   --   --   --  ALKPHOS 115  --   --   --   --   --   --   BILITOT 1.5*  --   --   --   --   --   --   PROT 5.3*  --   --   --   --   --   --   ALBUMIN 2.1*   < > 1.8* 1.9* 1.9* 1.8* 1.5*   < > = values in this interval not displayed.   No results for input(s): LIPASE, AMYLASE in the last 168 hours. No results for input(s): AMMONIA in the last 168 hours.  CBC: Recent Labs  Lab 12/16/19 0444 12/16/19 0444 12/16/19 1530 12/17/19 0344 12/18/19 0406 12/19/19 0425 12/20/19 1302  WBC 8.0  --   --  7.7 8.7 9.7 8.5  HGB 11.6*  --   --  11.4* 12.6* 11.2* 11.7*  HCT 37.4*  --   --  36.7* 39.9 34.3* 35.1*  MCV 88.4  --   --  87.8 85.8 82.7 81.8  PLT 45*   < > 40* 43* 48* 48* 57*   < > = values in this interval not displayed.    Cardiac Enzymes: No results for input(s): CKTOTAL, CKMB, CKMBINDEX, TROPONINI in the last 168 hours.  BNP: BNP (last 3 results) Recent Labs    12/09/19 0130  BNP 2,803.0*    ProBNP (last 3 results) No results for input(s): PROBNP in the last 8760 hours.   CBG: Recent Labs  Lab 12/19/19 0727 12/19/19 1124 12/19/19 1657 12/20/19 0813 12/20/19 1144  GLUCAP 114* 172*  147* 101* 130*    Coagulation Studies: No results for input(s): LABPROT, INR in the last 72 hours.   Imaging   No results found.   Medications:     Current Medications: . amiodarone  200 mg Oral BID  . atorvastatin  10 mg Oral q1800  . B-complex with vitamin C  1 tablet Oral Daily  . cefdinir  300 mg Oral Q12H  . chlorhexidine gluconate (MEDLINE KIT)  15 mL Mouth Rinse BID  . Chlorhexidine Gluconate Cloth  6 each Topical Daily  . dextrose  50 mL Intravenous Once  . insulin aspart  0-15 Units Subcutaneous TID WC  . sodium chloride flush  3 mL Intravenous Q12H    Infusions: . sodium chloride Stopped (12/19/19 1112)    Assessment/Plan   1. Cardiogenic shock/ Septic Shock - EF 30-35% by echo (newly down) - Unclear etiology ? AF vs ischemia.  Compounded by IV diltiazem  - Blood CX 1/29 - No growth  - Respiratory Cx 1/29- moraxella - was on meropenum stopped 2/3 and switched to zinacef. Completed 7 day course. Now on cefdinir, day 2/5.  - Improved. Off pressors w/ co-ox remaining stable x 72 hrs at 51% (last checked 2/7).    2. Acute systolic HF - management as above - ECHO 30-35%.  - Hold off on bb, arb, spiro for now due to recent shock + AKI - hold off on hydralazine + nitrate to allow more BP for renal perfusion   - Off pressors. Co-ox 51% yesterday - Volume status ok. Will check wt again today and follow daily.   - if recovers will need R/L cath but given AKI likely defer  -Has multi-system organ failure in setting of underlying multiple myeloma. Shock is mixed picture but cardiac output remains low with newly reduced EF. Not candidate for advanced HF therapies   3. Acute hypoxic respiratory  failure - Extubated 2/5, much improved - CCM has signed of  4. CAD - s/p previous PCIs - hs trop 30 -> 43 -> 43. Not c/w ACS. No change  5. AKI - Creatinine 1.3-> 2.1->3.77->3.44>4.05>>3.8>3.7 -> CVVHD -> 2.4 -> 2.4->2.73 - BUN 173 -> 121 -> 117 ->-> 121-->125 - Due to  ATN from shock - CVVHD started 2/4. UOP slowing, only 555 cc out yesterday  - Nephrology following   - No current indication for further HD.  - Per nephrology, will continue today and will decide on need for additional dialysis   6. Chronic A fib  - continue PO amiodarone 200 bid  - a/c currently on hold  - Platelets nadired at 48k yesterday.  - Will repeat CBC. If platelets recover can start heparin   7. Multiple myeloma - treated at [facility]  8. Dementia - unclear how severe this is  9. Thrombocytopenia - Platelets 133 on admit.  - Platelets trending down to 54>51>50>45-> 43 -> 48-> 48k - HIT negative - repeat CBC today   10. Hypernatremia - Improved with CVVHD - Receiving free water. Na 143 today  11. NSVT - Has had frequent PVCs and NSVT this admit. Tele not ordered on transfer out of icu.  - will order telemetry for monitoring - continue amiodarone 200 po bid  - Keep K>4.0 Mg > 2.0   12. DNR/DNI   Length of Stay: [place], MD  12/20/2019, 2:42 PM  Advanced Heart Failure Team Pager [phone] (M-F; [place])  Please contact [facility] for night-coverage after hours (4p -7a ) and weekends on [email or web address]   Patient seen and examined with the above-signed Advanced Practice Provider and/or Housestaff. I personally reviewed laboratory data, imaging studies and relevant notes. I independently examined the patient and formulated the important aspects of the plan. I have edited the note to reflect any of my changes or salient points. I have personally discussed the plan with the patient and/or family.  He remains very weak/lethargic. Denies CP or SOB. Co-ox remains low. Renal function starting to get worse again with BUN/cr 125/2.73. Volume doesn't look too bad. PVCs relatively suppressed on po amio.   Not sure there is much else to offer him at this point. He is not candidate for advanced therapies. We wil follow from a distance. Palliative discussions ongoing  and are very appropriate.   Glori Bickers, MD  2:44 PM

## 2019-12-20 NOTE — Progress Notes (Signed)
  D.w [facility] MD - Dr Cathlean Sauer - [facility] will call CCM back if needed . CCM can sign off    SIGNATURE    Dr. Brand Males, M.D., F.C.C.P,  Pulmonary and Critical Care Medicine Staff Physician, [place] Director - Interstitial Lung Disease  Program  [facility] [place], [place], [address]  Pager: [phone], If no answer or between  15:00h - 7:00h: call [phone] Telephone: [phone]  9:45 AM 12/20/2019

## 2019-12-20 NOTE — Progress Notes (Addendum)
PROGRESS NOTE    Jeremy Parrish  FUX:[id number] DOB: 04/08/1941 DOA: 12/09/2019 PCP: Celene Squibb, MD    Brief Narrative:  78 year old male who presented to the hospital with worsening dyspnea.  He does have significant past medical history for coronary artery disease, paroxysmal atrial fibrillation, sick sinus syndrome status post pacemaker, multiple myeloma, marginal zone lymphoma, COPD, hypertension and dyslipidemia.  Patient was noted to have progressive dyspnea for 3 days, associated with agitation, bilateral extremity edema, and orthopnea.  Patient symptoms were refractory to outpatient management with steroids.  On his initial physical examination his heart rate was 122, temperature 97.6, blood pressure 134/87, oxygen saturation 99% on 2 L per nasal cannula.  His lungs are clear to auscultation bilaterally, heart S1-S2, present, irregularly irregular, abdomen soft, 2+ bilateral lower extremity edema.  Sodium 135, potassium 4.4, chloride 102, bicarb 22, glucose 128, BUN 36, creatinine 1.35.  Chest radiograph with cardiomegaly and bibasilar atelectasis.  EKG 121 bpm, left axis deviation, underlying rhythm atrial fibrillation with intermittent ventricular pacing no significant ST segment or T wave changes.  Patient was admitted to the hospital working diagnosis of atrial fibrillation with rapid ventricular response.  Patient developed cardiogenic shock, further work-up revealed newly depressed ejection fraction 30 to 35%.  Patient was placed on vasopressors and mechanical ventilation.  Patient was sent for diagnosed with pneumonia due to Moraxella catarrhalis with sepsis.  Received broad-spectrum antibiotic therapy.  He suffered from acute tubular necrosis and underwent renal replacement therapy.  Regained hemodynamic stability but remained on mechanical ventilation due to encephalopathy.  Patient was successfully extubated February 5.   Assessment & Plan:   Principal Problem:   Atrial  fibrillation with RVR (HCC) Active Problems:   Hx of lymphoma   Multiple myeloma in remission 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 Metabolic encephalopathy with hypoactive delirium. Patient is calm and cooperating this am, able to answer simple questions and follow simple commands. Will continue supportive medical therapy, avoid sedatives and continue to encourage nutrition and out of bed with physical therapy.   2. AKI on CKD stage 3b, ATN due to shock, present on admission. Hyponatremia Patient clinically not hypervolemic, renal function with serum cr at 2,73 from 2,48 with K at 4,0 and serum bicarbonate at 21. Documented urine output over last 24 H 550 ml. Temporal HD catheter removed 02/07.   3. Shock cardiogenic and septic (moraxella pneumonia present on admission). Blood pressure stable 127/76 mmHg, wbc at 8,5. Will continue with antibiotic therapy with cefdinir #2/5.    LV EF 30 to 35%, not candidate for advanced therapies. Clinically euvolemic.   Patient continue to be at high risk for hemodynamic decompensation.   4. Acute on chronic systolic heart failure in the setting of atrial  fibrillation with RVR/ NSVT. No signs of volume overload today, will continue rate control with  amiodarone. Holding anticoagulation due to high bleeding risk in the setting of thrombocytopenia.   5. T2DM. Fasting glucose this am 130, continue glucose cover and monitoring with insulin sliding scale.   6. Stage 1 coccyx pressure ulcer, not present on admission. Continue with local wound care and frequent turning.   DVT prophylaxis: scd  Code Status:  dnr  Family Communication: no family at the bedside  Disposition Plan/ discharge barriers: patient continue critically ill.    Nutrition Status:   Skin Documentation: Pressure Injury 12/14/19 Coccyx Mid Stage 1 -  Intact skin with non-blanchable redness of a localized area usually over a bony prominence. (Active)  12/14/19 0800  Location: Coccyx  Location Orientation: Mid  Staging: Stage 1 -  Intact skin with non-blanchable redness of a localized area usually over a bony prominence.  Wound Description (Comments):   Present on Admission: No     Consultants:   Pulmonary   Cardiology   Palliative Care  Procedures:   R IJ CVC 01/29  Antimicrobials:       Subjective: Patient is more awake and reactive today, no nausea or vomiting, dyspnea is improving, no chest pain. Complains of feeling cold.   Objective: Vitals:   12/19/19 1400 12/19/19 1500 12/19/19 1604 12/20/19 0817  BP: 107/62 120/68 115/65 127/76  Pulse: 62 63 (!) 37 62  Resp: 18 (!) 29  20  Temp:    (!) 97.5 F (36.4 C)  TempSrc:      SpO2: 98% 96% 99% 96%  Weight:      Height:        Intake/Output Summary (Last 24 hours) at 12/20/2019 0835 Last data filed at 12/20/2019 7858 Gross per 24 hour  Intake 308.84 ml  Output 925 ml  Net -616.16 ml   Filed Weights   12/18/19 0500 12/19/19 0040 12/19/19 0422  Weight: 79 kg 79.6 kg 79.6 kg    Examination:   General: Not in pain or dyspnea, deconditioned  Neurology: Awake and alert, non focal, able to follow commands and answer simple questions.   E ENT: no pallor, no icterus, oral mucosa  moist Cardiovascular: No JVD. S1-S2 present, rhythmic, no gallops, rubs, or murmurs. No lower extremity edema. Pulmonary: poasitive breath sounds bilaterally, decreased air movement, no wheezing, rhonchi or rales. Poor inspiratory effort.  Gastrointestinal. Abdomen with no organomegaly, non tender, no rebound or guarding Skin. No rashes Musculoskeletal: no joint deformities/ bilateral boots.      Data Reviewed: I have personally reviewed following labs and imaging studies  CBC: Recent Labs  Lab 12/15/19 0857 12/15/19 0857 12/16/19 0444 12/16/19 1530 12/17/19 0344 12/18/19 0406 12/19/19 0425  WBC 10.2  --  8.0  --  7.7 8.7 9.7  HGB 12.3*  --  11.6*  --  11.4* 12.6* 11.2*  HCT 39.3  --  37.4*  --  36.7* 39.9 34.3*  MCV 87.7  --  88.4  --  87.8 85.8 82.7  PLT 50*   < > 45* 40* 43* 48* 48*   < > = values in this interval not displayed.   Basic Metabolic Panel: Recent Labs  Lab 12/16/19 0036 12/16/19 0444 12/17/19 0343 12/17/19 0343 12/17/19 0344 12/17/19 1617 12/18/19 0406 12/18/19 1522 12/19/19 0425 12/20/19 0256  NA 155*   < > 148*   < >  --  143 148*  147* 143 143 143  K 3.1*   < >  4.1   < >  --  4.1 4.5  4.5 4.0 3.5 4.0  CL 112*   < > 111   < >  --  107 111  108 106 109 103  CO2 27   < > 26   < >  --  _0 21* 21* 21*  GLUCOSE 175*   < > 154*   < >  --  130* 194*  201* 284* 118* 130*  BUN 177*   < > 114*   < >  --  105* 117*  121* 131* 121* 125*  CREATININE 3.70*   < > 2.15*   < >  --  2.13* 2.43*  2.52* 2.76* 2.48* 2.73*  CALCIUM 8.1*   < > 7.8*   < >  --  7.8* 7.8*  8.2* 8.0* 7.1* 8.1*  MG 2.5*  --   --   --  2.1  --  2.4  --  2.1 2.1  PHOS  --    < > 4.3  --   --  5.4* 6.9* 7.3* 6.2*  --    < > = values in this interval not displayed.   GFR: Estimated Creatinine Clearance: 22.9 mL/min (A) (by C-G formula based on SCr of 2.73 mg/dL (H)). Liver Function Tests: Recent Labs  Lab 12/15/19 0133 12/16/19 1332 12/17/19 0343 12/17/19 1617  12/18/19 0406 12/18/19 1522 12/19/19 0425  AST 71*  --   --   --   --   --   --   ALT 551*  --   --   --   --   --   --   ALKPHOS 115  --   --   --   --   --   --   BILITOT 1.5*  --   --   --   --   --   --   PROT 5.3*  --   --   --   --   --   --   ALBUMIN 2.1*   < > 1.8* 1.9* 1.9* 1.8* 1.5*   < > = values in this interval not displayed.   No results for input(s): LIPASE, AMYLASE in the last 168 hours. No results for input(s): AMMONIA in the last 168 hours. Coagulation Profile: Recent Labs  Lab 12/16/19 1530  INR 1.7*   Cardiac Enzymes: No results for input(s): CKTOTAL, CKMB, CKMBINDEX, TROPONINI in the last 168 hours. BNP (last 3 results) No results for input(s): PROBNP in the last 8760 hours. HbA1C: No results for input(s): HGBA1C in the last 72 hours. CBG: Recent Labs  Lab 12/19/19 0325 12/19/19 0727 12/19/19 1124 12/19/19 1657 12/20/19 0813  GLUCAP 115* 114* 172* 147* 101*   Lipid Profile: No results for input(s): CHOL, HDL, LDLCALC, TRIG, CHOLHDL, LDLDIRECT in the last 72 hours. Thyroid Function Tests: No results for input(s): TSH, T4TOTAL, FREET4, T3FREE, THYROIDAB in the last 72 hours. Anemia Panel: No results for input(s): VITAMINB12, FOLATE, FERRITIN, TIBC, IRON, RETICCTPCT in the last 72 hours.    Radiology Studies: I have reviewed all of the imaging during this hospital visit personally     Scheduled Meds: . amiodarone  200 mg Oral BID  . atorvastatin  10 mg Oral q1800  . B-complex with vitamin C  1 tablet Oral Daily  . cefdinir  300 mg Oral Q12H  . chlorhexidine gluconate (MEDLINE KIT)  15 mL Mouth Rinse BID  . Chlorhexidine Gluconate Cloth  6 each  Topical Daily  . dextrose  50 mL Intravenous Once  . insulin aspart  0-15 Units Subcutaneous TID WC  . sodium chloride flush  3 mL Intravenous Q12H   Continuous Infusions: . sodium chloride Stopped (12/19/19 1112)     LOS: 11 days        Jeremy Parrish Gerome Apley, MD

## 2019-12-20 NOTE — Progress Notes (Signed)
Patient Alert and oriented to self upon shift report. Denied pain. Patient flexi seal D/C per MD order. Patient tolerated meals well with wife by side. Patient resting comfortably

## 2019-12-21 LAB — GLUCOSE, CAPILLARY
Glucose-Capillary: 121 mg/dL — ABNORMAL HIGH (ref 70–99)
Glucose-Capillary: 140 mg/dL — ABNORMAL HIGH (ref 70–99)
Glucose-Capillary: 181 mg/dL — ABNORMAL HIGH (ref 70–99)
Glucose-Capillary: 169 mg/dL — ABNORMAL HIGH (ref 70–99)

## 2019-12-21 LAB — BASIC METABOLIC PANEL
Anion gap: 13 (ref 5–15)
BUN: 112 mg/dL — ABNORMAL HIGH (ref 8–23)
CO2: 22 mmol/L (ref 22–32)
Calcium: 7.9 mg/dL — ABNORMAL LOW (ref 8.9–10.3)
Chloride: 107 mmol/L (ref 98–111)
Creatinine, Ser: 2.7 mg/dL — ABNORMAL HIGH (ref 0.61–1.24)
GFR calc Af Amer: 25 mL/min — ABNORMAL LOW (ref >60)
GFR calc non Af Amer: 22 mL/min — ABNORMAL LOW (ref >60)
Glucose, Bld: 111 mg/dL — ABNORMAL HIGH (ref 70–99)
Potassium: 3.4 mmol/L — ABNORMAL LOW (ref 3.5–5.1)
Sodium: 142 mmol/L (ref 135–145)

## 2019-12-21 NOTE — Progress Notes (Signed)
PROGRESS NOTE    Jeremy Parrish  LXB:[id number] DOB: 05/31/1941 DOA: 12/09/2019 PCP: Celene Squibb, MD    Brief Narrative:   Patient initially admitted for decompensated atrial fibrillation with rapid ventricular response, complicated by cardiogenic and septic shock (pneumonia). He had prolonged hospital stay, requiring invasive mechanical ventilation and renal replacement therapy.   79 year old male who presented to the hospital with worsening dyspnea.  He does have significant past medical history for coronary artery disease, paroxysmal atrial fibrillation, sick sinus syndrome status post pacemaker, multiple myeloma, marginal zone lymphoma, COPD, hypertension and dyslipidemia.  Patient was noted to have progressive dyspnea for 3 days, associated with agitation, bilateral extremity edema, and orthopnea.  Patient symptoms were refractory to outpatient management with steroids.  On his initial physical examination his heart rate was 122, temperature 97.6, blood pressure 134/87, oxygen saturation 99% on 2 L per nasal cannula.  His lungs are clear to auscultation bilaterally, heart S1-S2, present, irregularly irregular, abdomen soft, 2+ bilateral lower extremity edema.  Sodium 135, potassium 4.4, chloride 102, bicarb 22, glucose 128, BUN 36, creatinine 1.35.  Chest radiograph with cardiomegaly and bibasilar atelectasis.  EKG 121 bpm, left axis deviation, underlying rhythm atrial fibrillation with intermittent ventricular pacing no significant ST segment or T wave changes.  Patient was admitted to the hospital working diagnosis of atrial fibrillation with rapid ventricular response.  Patient developed cardiogenic shock, further work-up revealed newly depressed ejection fraction 30 to 35%.  Patient was placed on vasopressors and mechanical ventilation.  Patient was further diagnosed with pneumonia due to Moraxella catarrhalis with sepsis (present on admission).  Received broad-spectrum antibiotic  therapy.  He suffered from acute tubular necrosis and underwent renal replacement therapy.  Regained hemodynamic stability but remained on mechanical ventilation due to encephalopathy.  Patient was successfully extubated February 5.   Transferred to [facility] on 12/20/19. Patient continue to be very weak and deconditioned. Has remained stable from the cardiovascular and renal perspective.    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 Metabolic encephalopathy with hypoactive delirium. This am patient is out of bed to chair, continue to answer to simple questions and follow simple commands. No significant confusion or agitation. Continue neuro checks per unit protocol and aspiration precautions. Dysphagia 2 diet. Will dc fentanyl for now and continue as needed oxycodone for pain control.   2. AKI on CKD stage 3b, ATN due to shock, present on admission. Hyponatremia. Temporal HD catheter removed 02/07.  Stable renal function with serum cr at 2,70 with K at 3,4 and serum bicarbonate at 22. Urine output over last 24 H  1,250 ml. Will continue to follow up on  renal function and electrolytes, avoid hypotension and nephrotoxic medications. Today with no indications for renal replacement therapy.   3. Shock cardiogenic and septic (moraxella pneumonia present on admission). LV EF 30 to 35%, not candidate for advanced therapies.  His blood pressure has remained stable 135/80 mmHg. Tolerating well antibiotic therapy with cefdinir #3/5.  No further cardiac work up at this point in time. Continue with telemetry monitoring for now.   4. Acute on chronic systolic heart failure in the setting of atrial fibrillation with RVR/ NSVT. Tolerating well amiodarone. Continue holding anticoagulation due to elevated bleeding risk and thrombocytopenia.    5. T2DM (Hgb A1c 5,6)/ dyslipidemia. Fasting glucose this am 111, capillary 166 and 140. Will hold on further insulin coverage for now. Continue with atorvastatin.   6. Stage 1 coccyx pressure ulcer, not present on admission. On local wound care, out of bed to chair tid with meals.   7. Protein calorie malnutrition. Unspecified severity. Continue nutritional supplements.   DVT prophylaxis: scd  Code Status:  dnr  Family Communication: I spoke with patient's wife at the bedside, we talked in detail about patient's condition, plan of care and prognosis and all questions were addressed. Disposition Plan/ discharge barriers: patient will need SNF in 48 to 72H if continue to be stable from the cardiovascular and renal perspective.    Nutrition Status: Nutrition Problem: Inadequate oral intake Etiology: acute illness Signs/Symptoms: NPO status Interventions: Tube feeding     Skin Documentation: Pressure Injury 12/14/19 Coccyx Mid Stage 1 -  Intact skin with non-blanchable redness of a localized area usually over a bony prominence. (Active)  12/14/19 0800  Location: Coccyx  Location Orientation: Mid  Staging: Stage 1 -  Intact skin with non-blanchable redness of a localized area usually over a bony prominence.    Wound Description (Comments):   Present on Admission: No     Consultants:   Nephrology   Cardiology   Pulmonary   Palliative care  Procedures:   Right IJ CVC 01/29     Subjective: Patient is out of bed to chair, continue to be very weak and deconditioned, no nausea or vomiting, positive dyspnea at rest, no chest pain.   Objective: Vitals:   12/20/19 0817 12/20/19 1648 12/20/19 2309 12/21/19 0753  BP: 127/76 121/70 124/68 135/80  Pulse: 62 71 71 74  Resp: _0 Temp: (!) 97.5 F (36.4 C) 97.6 F (36.4 C) 98.1 F (36.7 C) (!) 97.5 F (36.4 C)  TempSrc:      SpO2: 96% 96% 99% 96%  Weight:      Height:        Intake/Output Summary (Last 24 hours) at 12/21/2019 0929 Last data filed at 12/20/2019 1905 Gross per 24 hour  Intake --  Output 650 ml  Net -650 ml   Filed Weights   12/18/19 0500 12/19/19 0040 12/19/19 0422  Weight: 79 kg 79.6 kg 79.6 kg    Examination:   General: Not in pain or dyspnea, deconditioned  Neurology: Awake and alert, non focal  E ENT: mild pallor, no icterus, oral mucosa moist Cardiovascular: No JVD. S1-S2 present, rhythmic, no gallops, rubs, or murmurs. No lower extremity edema. Pulmonary: positive breath sounds bilaterally, decreased air movement, with poor inspiratory effort, no wheezing, rhonchi or rales. Gastrointestinal. Abdomen with no organomegaly, non tender, no rebound or guarding Skin. No rashes Musculoskeletal: no joint deformities     Data Reviewed: I have  personally reviewed following labs and imaging studies  CBC: Recent Labs  Lab 12/16/19 0444 12/16/19 0444 12/16/19 1530 12/17/19 0344 12/18/19 0406 12/19/19 0425 12/20/19 1302  WBC 8.0  --   --  7.7 8.7 9.7 8.5  HGB 11.6*  --   --  11.4* 12.6* 11.2* 11.7*  HCT 37.4*  --   --  36.7* 39.9 34.3* 35.1*  MCV 88.4  --   --  87.8 85.8 82.7 81.8  PLT 45*   < > 40* 43* 48* 48* 57*   < > = values in this interval not displayed.   Basic Metabolic  Panel: Recent Labs  Lab 12/16/19 0036 12/16/19 0444 12/17/19 0343 12/17/19 0343 12/17/19 0344 12/17/19 1617 12/17/19 1617 12/18/19 0406 12/18/19 1522 12/19/19 0425 12/20/19 0256 12/21/19 0205  NA 155*   < > 148*   < >  --  143   < > 148*  147* 143 143 143 142  K 3.1*   < > 4.1   < >  --  4.1   < > 4.5  4.5 4.0 3.5 4.0 3.4*  CL 112*   < > 111   < >  --  107   < > 111  108 106 109 103 107  CO2 27   < > 26   < >  --  25   < > 24  24 21* 21* 21* 22  GLUCOSE 175*   < > 154*   < >  --  130*   < > 194*  201* 284* 118* 130* 111*  BUN 177*   < > 114*   < >  --  105*   < > 117*  121* 131* 121* 125* 112*  CREATININE 3.70*   < > 2.15*   < >  --  2.13*   < > 2.43*  2.52* 2.76* 2.48* 2.73* 2.70*  CALCIUM 8.1*   < > 7.8*   < >  --  7.8*   < > 7.8*  8.2* 8.0* 7.1* 8.1* 7.9*  MG 2.5*  --   --   --  2.1  --   --  2.4  --  2.1 2.1  --   PHOS  --    < > 4.3  --   --  5.4*  --  6.9* 7.3* 6.2*  --   --    < > = values in this interval not displayed.   GFR: Estimated Creatinine Clearance: 23.2 mL/min (A) (by C-G formula based on SCr of 2.7 mg/dL (H)). Liver Function Tests: Recent Labs  Lab 12/15/19 0133 12/16/19 1332 12/17/19 0343 12/17/19 1617 12/18/19 0406 12/18/19 1522 12/19/19 0425  AST 71*  --   --   --   --   --   --   ALT 551*  --   --   --   --   --   --   ALKPHOS 115  --   --   --   --   --   --   BILITOT 1.5*  --   --   --   --   --   --   PROT 5.3*  --   --   --   --   --   --   ALBUMIN 2.1*   < > 1.8* 1.9* 1.9* 1.8* 1.5*   < > = values in this interval not displayed.   No results for input(s): LIPASE, AMYLASE in the last 168  hours. No results for input(s): AMMONIA in the last 168 hours. Coagulation Profile: Recent Labs  Lab 12/16/19 1530  INR 1.7*   Cardiac Enzymes: No results for input(s): CKTOTAL, CKMB, CKMBINDEX, TROPONINI in the last 168 hours. BNP (last 3 results) No results for input(s): PROBNP in the last 8760 hours. HbA1C: No results for input(s):  HGBA1C in the last 72 hours. CBG: Recent Labs  Lab 12/20/19 0813 12/20/19 1144 12/20/19 1643 12/20/19 2042 12/21/19 0749  GLUCAP 101* 130* 163* 166* 121*   Lipid Profile: No results for input(s): CHOL, HDL, LDLCALC, TRIG, CHOLHDL, LDLDIRECT in the last 72 hours. Thyroid Function Tests: No results for input(s): TSH, T4TOTAL, FREET4, T3FREE, THYROIDAB in the last 72 hours. Anemia Panel: No results for input(s): VITAMINB12, FOLATE, FERRITIN, TIBC, IRON, RETICCTPCT in the last 72 hours.    Radiology Studies: I have reviewed all of the imaging during this hospital visit personally     Scheduled Meds: . amiodarone  200 mg Oral BID  . atorvastatin  10 mg Oral q1800  . B-complex with vitamin C  1 tablet Oral Daily  . cefdinir  300 mg Oral Q12H  . chlorhexidine gluconate (MEDLINE KIT)  15 mL Mouth Rinse BID  . Chlorhexidine Gluconate Cloth  6 each Topical Daily  . dextrose  50 mL Intravenous Once  . insulin aspart  0-15 Units Subcutaneous TID WC  . sodium chloride flush  3 mL Intravenous Q12H   Continuous Infusions: . sodium chloride Stopped (12/19/19 1112)     LOS: 12 days        Harace Mccluney Gerome Apley, MD

## 2019-12-21 NOTE — Evaluation (Signed)
Occupational Therapy Evaluation Patient Details Name: Jeremy Parrish MRN: [id number] DOB: [birth year] Today's Date: 12/21/2019    History of Present Illness Pt is a 79 y.o. male admitted 12/09/19 with cardiogenic and septic shock of unclear etiology; AF vs ischemia; complicated by worsening renal function. ETT 1/29-2/5. Required CRRT 2/4-2/5. PMH includes CVA w/ resultant aphasia (2013), multiple myeloma, bladder CA, prostate CA, COPD, HTN, R rotator cuff repair.   Clinical Impression   PTA patient reports completing ADLs, mobility independently.  Admitted for above and limited by problem list below, including generalized weakness, decreased activity tolerance, impaired balance, impaired cognition (initation, sequencing, processing). Known history of expressive aphasia, but able to respond to yes/no questions fairly accurately. He currently requires max-total assist +2 for all ADLs, max assist +2 for sit to partial stand, and mod -max assist for static and dynamic sitting balance with poor tolerance and R lateral/posterior lean.  Requested soft call bell for patient to increase safety. He will benefit from intensive CIR level rehab to optimize return to PLOF with ADLs, mobility and decrease burden of care.  Will follow.     Follow Up Recommendations  CIR;Supervision/Assistance - 24 hour    Equipment Recommendations  3 in 1 bedside commode    Recommendations for Other Services Rehab consult     Precautions / Restrictions Precautions Precautions: Fall;Other (comment) Precaution Comments: H/o expressive aphasia; bowel incontinence Restrictions Weight Bearing Restrictions: No      Mobility Bed Mobility Overal bed mobility: Needs Assistance             General bed mobility comments: Received sitting in recliner  Transfers Overall transfer level: Needs assistance Equipment used: 2 person hand held assist Transfers: Sit to/from Stand Sit to Stand: Max assist;+2 physical  assistance         General transfer comment: max assist +2 to power up and steady in partial stand with B knees blocked and use of gait/belt pad to transition     Balance Overall balance assessment: Needs assistance Sitting-balance support: Bilateral upper extremity supported;Feet supported Sitting balance-Leahy Scale: Poor Sitting balance - Comments: sitting unsupported in recliner with cueing and mod-max support to initate anterior trunk translation; limited tolerance but able to maintain with B UE propped; some R lateral lean  Postural control: Posterior lean;Right lateral lean   Standing balance-Leahy Scale: Zero                             ADL either performed or assessed with clinical judgement   ADL Overall ADL's : Needs assistance/impaired     Grooming: Maximal assistance;Sitting Grooming Details (indicate cue type and reason): hand over hand support to complete facewashing                      Toileting- Clothing Manipulation and Hygiene: Total assistance;+2 for physical assistance       Functional mobility during ADLs: +2 for physical assistance;+2 for safety/equipment;Maximal assistance General ADL Comments: total assist +2 for all self care at this time      Vision         Perception     Praxis      Pertinent Vitals/Pain Pain Assessment: No/denies pain     Hand Dominance Right   Extremity/Trunk Assessment Upper Extremity Assessment Upper Extremity Assessment: Generalized weakness(PROM WFL, AROM <30* FF )   Lower Extremity Assessment Lower Extremity Assessment: Defer to PT evaluation  Communication Communication Communication: Expressive difficulties   Cognition Arousal/Alertness: Awake/alert Behavior During Therapy: Flat affect Overall Cognitive Status: Difficult to assess                                 General Comments: h/o expressive aphasia, following multiple commands with increased time and  decreased initation to task; improved during session, fairly consistent with yes/no questions    General Comments  SpO2 maintained on 6LO2, 97% at rest and down to 93% with minimal activity     Exercises Other Exercises Other Exercises: reaching for items to promote balance and functional reach with max assist    Shoulder Instructions      Home Living Family/patient expects to be discharged to:: Private residence Living Arrangements: Spouse/significant other Available Help at Discharge: Family;Available 24 hours/day Type of Home: House Home Access: Stairs to enter [employer] of Steps: 5 Entrance Stairs-Rails: Right;Left;Can reach both Home Layout: One level     Bathroom Shower/Tub: Occupational psychologist: Standard     Home Equipment: Cane - single point;Walker - 2 wheels;Shower seat;Grab bars - tub/shower;Hand held shower head          Prior Functioning/Environment Level of Independence: Independent                 OT Problem List: Decreased strength;Decreased range of motion;Decreased activity tolerance;Impaired balance (sitting and/or standing);Decreased coordination;Decreased cognition;Decreased safety awareness;Decreased knowledge of use of DME or AE;Decreased knowledge of precautions;Cardiopulmonary status limiting activity;Obesity;Impaired UE functional use;Increased edema      OT Treatment/Interventions: Self-care/ADL training;DME and/or AE instruction;Therapeutic exercise;Therapeutic activities;Balance training;Patient/family education;Cognitive remediation/compensation    OT Goals(Current goals can be found in the care plan section) Acute Rehab OT Goals Patient Stated Goal: home per wife OT Goal Formulation: With patient Time For Goal Achievement: 01/04/20 Potential to Achieve Goals: Good  OT Frequency: Min 3X/week   Barriers to D/C:            Co-evaluation PT/OT/SLP Co-Evaluation/Treatment: Yes Reason for Co-Treatment:  Necessary to address cognition/behavior during functional activity;For patient/therapist safety;To address functional/ADL transfers   OT goals addressed during session: ADL's and self-care      AM-PAC OT "6 Clicks" Daily Activity     Outcome Measure Help from another person eating meals?: A Lot Help from another person taking care of personal grooming?: A Lot Help from another person toileting, which includes using toliet, bedpan, or urinal?: Total Help from another person bathing (including washing, rinsing, drying)?: A Lot Help from another person to put on and taking off regular upper body clothing?: Total Help from another person to put on and taking off regular lower body clothing?: Total 6 Click Score: 9   End of Session Equipment Utilized During Treatment: Gait belt;Oxygen(6L) Nurse Communication: Mobility status;Other (comment)(soft call bell, needs assist for meals )  Activity Tolerance: Patient tolerated treatment well Patient left: in chair;with call bell/phone within reach;with chair alarm set  OT Visit Diagnosis: Other abnormalities of gait and mobility (R26.89);Muscle weakness (generalized) (M62.81);Other symptoms and signs involving cognitive function;Cognitive communication deficit (R41.841) Symptoms and signs involving cognitive functions: Cerebral infarction(old)                Time: 3710-6269 OT Time Calculation (min): 29 min Charges:  OT General Charges $OT Visit: 1 Visit OT Evaluation $OT Eval Moderate Complexity: [place] B, OT Acute Rehabilitation Services Pager [phone] Office [phone]    Adonis Brook  S Kanika Bungert 12/21/2019, 12:09 PM

## 2019-12-21 NOTE — Progress Notes (Signed)
Patient ID: Jeremy Parrish, male   DOB: 1941/06/05, 79 y.o.   MRN: [id number] [facility] Progress Note   Assessment/ Plan:   1. Acute kidney Injury on chronic kidney disease stage III (baseline creatinine 1.3-1.5): Appears to have suffered multifactorial acute kidney injury with cardiogenic shock/nephrotoxin exposure and transiently on CRRT for uremia between 2/4-2/5.  BUN and creatinine essentially unchanged overnight with fair urine output without any diuretics administered; plan to hold these off for another 24 hours and reevaluate treatment.  I fear that he would be a very poor candidate for chronic/maintenance hemodialysis with tenuous cardiac status and deconditioning-long-term dialysis unlikely to add much value to his quality of life. 2.  Mixed septic/cardiogenic shock: Improving status post antibiotic therapy with transient pressor requirements. 3.  Moraxella pneumonia: Previous antibiotic therapy spectrum now narrowed down to oral cefdinir.  He remains afebrile and hemodynamically stable. 4.  Acute systolic heart failure: With fair urine output overnight 1.2 L and unchanged BUN/creatinine; without indications for dialysis and will hold diuretics for another 24 hours.  Subjective:   Asking for help to change his clothes, denies any chest pain or shortness of breath.   Objective:   BP 124/68 (BP Location: Right Arm)   Pulse 71   Temp 98.1 F (36.7 C)   Resp 18   Ht '5\' 7"'$  (1.702 m)   Wt 79.6 kg   SpO2 99%   BMI 27.49 kg/m   Intake/Output Summary (Last 24 hours) at 12/21/2019 2952 Last data filed at 12/20/2019 1905 Gross per 24 hour  Intake --  Output 1250 ml  Net -1250 ml   Weight change:   Physical Exam: Gen: Appears somewhat confused tugging at his gown and visibly fatigued. CVS: Pulse irregularly irregular, S1 and S2 without murmur Resp: Coarse/transmitted breath sounds bilaterally without rales or rhonchi.  Right IJ site dressing. Abd: Soft, flat,  nontender Ext: No lower extremity edema.  Imaging: No results found.  Labs: BMET Recent Labs  Lab 12/16/19 1530 12/16/19 1530 12/16/19 2017 12/16/19 2017 12/17/19 0343 12/17/19 1617 12/18/19 0406 12/18/19 1522 12/19/19 0425 12/20/19 0256 12/21/19 0205  NA 153*   < > 151*   < > 148* 143 148*  147* 143 143 143 142  K 3.3*   < > 3.5   < > 4.1 4.1 4.5  4.5 4.0 3.5 4.0 3.4*  CL 113*   < > 112*   < > 111 107 111  108 106 109 103 107  CO2 27   < > 26   < > '26 25 24  24 '$ 21* 21* 21* 22  GLUCOSE 195*   < > 177*   < > 154* 130* 194*  201* 284* 118* 130* 111*  BUN 158*   < > 140*   < > 114* 105* 117*  121* 131* 121* 125* 112*  CREATININE 3.08*   < > 2.72*   < > 2.15* 2.13* 2.43*  2.52* 2.76* 2.48* 2.73* 2.70*  CALCIUM 7.7*   < > 7.8*   < > 7.8* 7.8* 7.8*  8.2* 8.0* 7.1* 8.1* 7.9*  PHOS 4.9*  --  4.5  --  4.3 5.4* 6.9* 7.3* 6.2*  --   --    < > = values in this interval not displayed.   CBC Recent Labs  Lab 12/17/19 0344 12/18/19 0406 12/19/19 0425 12/20/19 1302  WBC 7.7 8.7 9.7 8.5  HGB 11.4* 12.6* 11.2* 11.7*  HCT 36.7* 39.9 34.3* 35.1*  MCV 87.8  85.8 82.7 81.8  PLT 43* 48* 48* 57*    Medications:    . amiodarone  200 mg Oral BID  . atorvastatin  10 mg Oral q1800  . B-complex with vitamin C  1 tablet Oral Daily  . cefdinir  300 mg Oral Q12H  . chlorhexidine gluconate (MEDLINE KIT)  15 mL Mouth Rinse BID  . Chlorhexidine Gluconate Cloth  6 each Topical Daily  . dextrose  50 mL Intravenous Once  . insulin aspart  0-15 Units Subcutaneous TID WC  . sodium chloride flush  3 mL Intravenous Q12H   Elmarie Shiley, MD 12/21/2019, 7:42 AM

## 2019-12-21 NOTE — Progress Notes (Signed)
Palliative:  HPI: 78 yo male with PMH significant for aortic aneurysm, CAD, sick sinus syndrome, atrial fibrillation, COPD, depression, bladder cancer, multiple myeloma admitted from [place] 12/09/19 with septic shock due to Moraxella pneumonia requiring transient intubation and CRRT. EF 30-35%.   I met today with Mr. Mallick and his wife at bedside. He did sit up in recliner and had PT/OT evaluation this morning. He is eating lunch well with wife. He is very weak and fatigued. I discussed with Mrs. Brum the plan moving forward. She is struggling with giving him every chance to improve but also wants to ensure his quality of life and comfort and make sure that she is able to be with him. She would consider CIR but knows that he may not have the stamina to participate in intensive therapy. We discussed pursing rehab vs taking him home with hospice. She tells me that family is meeting this evening to discuss the plan moving forward. I encouraged them to consider these options and that my fear is he is so fragile I worry he could decline in a rehab and I would be want him to spend the time he has left with his family.   I also called and spoke with son, Dallis Jr. He had many good questions and we discussed cardiac and renal decline and the difficult balance of managing fluid status and function. We discussed expectations moving forward and rehab vs home with hospice. He had many good questions. He would like to hear from Dr. Croitoru as he is familiar with patient and family and family trust his opinion (I discussed with Dr. Croitoru and he plans to reach out to wife). I have also offered to meet together with family as needed to discuss plan moving forward. I will reach out to Zedrick Jr. in the morning to assess their desire to meet and follow up on any further questions and the outcome of their discussion this evening.   I will continue to follow and discuss. All questions/concerns addressed. Emotional  support provided.   Exam: Alert, confused at times. No distress. Extreme fatigue. Breathing less labored at rest. Abd flat. Generalized weakness.   Plan: - Family discussing next steps and considering home with hospice option.   60 min   , NP Palliative Medicine Team Pager [phone] (Please see [email or web address] for schedule) Team Phone [phone]    Greater than 50%  of this time was spent counseling and coordinating care related to the above assessment and plan  

## 2019-12-21 NOTE — Plan of Care (Signed)

## 2019-12-21 NOTE — Progress Notes (Signed)
Physical Therapy Treatment Patient Details Name: NAMARI BRETON MRN: [id number] DOB: 02-02-1941 Today's Date: 12/21/2019    History of Present Illness Pt is a 79 y.o. male admitted 12/09/19 with cardiogenic and septic shock of unclear etiology; AF vs ischemia; complicated by worsening renal function. ETT 1/29-2/5. Required CRRT 2/4-2/5. PMH includes CVA w/ resultant aphasia (2013), multiple myeloma, bladder CA, prostate CA, COPD, HTN, R rotator cuff repair.   PT Comments    Pt received sitting in recliner; per RN, was able to perform transfer with maxA+2. Today's session focused on seated balance and trunk control activities, pt requiring mod-maxA for static and dynamic sitting balance, movement initiation improving by end of session. Performed standing trial with maxA+2, pt initiating anterior trunk translation and trunk/hip extension with max, multimodal cues; difficulty achieving fully upright posture. Less perseveration noted this session, pt following majority of simple commands with increased time. Continue to recommend post-acute rehab to maximize functional mobility and decrease caregiver burden. Pt pleasant and motivated to participate.  SpO2 93-97% on 6L O2   Follow Up Recommendations  CIR;Supervision for mobility/OOB     Equipment Recommendations  (defer)    Recommendations for Other Services       Precautions / Restrictions Precautions Precautions: Fall;Other (comment) Precaution Comments: H/o expressive aphasia; bowel incontinence Restrictions Weight Bearing Restrictions: No    Mobility  Bed Mobility               General bed mobility comments: Received sitting in recliner  Transfers Overall transfer level: Needs assistance Equipment used: 2 person hand held assist Transfers: Sit to/from Stand Sit to Stand: Max assist;+2 physical assistance         General transfer comment: Pt able to assist by initiating forward trunk translation, maxA+2 to assist trunk  elevation with gait belt and bed pad; pt able to initiate trunk/hip extension with max verbal/tactile cues; difficulty achieving fully upright posture; bilateral knees blocked for instability  Ambulation/Gait                 Stairs             Wheelchair Mobility    Modified Rankin (Stroke Patients Only)       Balance Overall balance assessment: Needs assistance Sitting-balance support: Bilateral upper extremity supported;No upper extremity supported;Feet supported Sitting balance-Leahy Scale: Poor Sitting balance - Comments: Prolonged time working on seated balance at edge of recliner; pt initiating anterior trunk translation with mod-maxA and assist for UE positioning; able to maintain upright posture with bilat elbows propped     Standing balance-Leahy Scale: Zero                              Cognition Arousal/Alertness: Awake/alert Behavior During Therapy: Flat affect Overall Cognitive Status: Difficult to assess                                 General Comments: H/o expressive aphasia. Following majority of simple commands, although limited by decreased initiation, this improved throughout session. Less perseveration this session, answering yes/no and majority of questions correctly. Pleasant and cooperative. noted a couple tears during session and pt reports sad regarding current status      Exercises Other Exercises Other Exercises: Assist for forward reaching and gripping objects with BUEs    General Comments General comments (skin integrity, edema, etc.): SpO2 97% on 6L O2,  down to 93% with seated/standing activity. Pt easily fatigued requiring prolonged seated rest between activity bouts      Pertinent Vitals/Pain Pain Assessment: No/denies pain    Home Living                      Prior Function            PT Goals (current goals can now be found in the care plan section) Acute Rehab PT Goals Patient Stated  Goal: home per wife PT Goal Formulation: With patient/family Time For Goal Achievement: 01/01/20 Potential to Achieve Goals: Fair Progress towards PT goals: Progressing toward goals    Frequency    Min 3X/week      PT Plan Current plan remains appropriate    Co-evaluation PT/OT/SLP Co-Evaluation/Treatment: Yes Reason for Co-Treatment: Necessary to address cognition/behavior during functional activity;For patient/therapist safety;To address functional/ADL transfers;Complexity of the patient's impairments (multi-system involvement)          AM-PAC PT "6 Clicks" Mobility   Outcome Measure  Help needed turning from your back to your side while in a flat bed without using bedrails?: A Lot Help needed moving from lying on your back to sitting on the side of a flat bed without using bedrails?: A Lot Help needed moving to and from a bed to a chair (including a wheelchair)?: Total Help needed standing up from a chair using your arms (e.g., wheelchair or bedside chair)?: Total Help needed to walk in hospital room?: Total Help needed climbing 3-5 steps with a railing? : Total 6 Click Score: 8    End of Session Equipment Utilized During Treatment: Oxygen;Gait belt Activity Tolerance: Patient tolerated treatment well Patient left: in chair;with call bell/phone within reach;with chair alarm set Nurse Communication: Mobility status PT Visit Diagnosis: Other abnormalities of gait and mobility (R26.89);Muscle weakness (generalized) (M62.81)     Time: 5797-2820 PT Time Calculation (min) (ACUTE ONLY): 24 min  Charges:  $Therapeutic Activity: 8-22 mins                    Mabeline Caras, PT, DPT Acute Rehabilitation Services  Pager [phone] Office [place] 12/21/2019, 10:45 AM

## 2019-12-22 ENCOUNTER — Inpatient Hospital Stay (HOSPITAL_COMMUNITY): Payer: Medicare Other

## 2019-12-22 DIAGNOSIS — J441 Chronic obstructive pulmonary disease with (acute) exacerbation: Secondary | ICD-10-CM

## 2019-12-22 DIAGNOSIS — I2510 Atherosclerotic heart disease of native coronary artery without angina pectoris: Secondary | ICD-10-CM

## 2019-12-22 DIAGNOSIS — F411 Generalized anxiety disorder: Secondary | ICD-10-CM

## 2019-12-22 LAB — BASIC METABOLIC PANEL
Anion gap: 12 (ref 5–15)
BUN: 93 mg/dL — ABNORMAL HIGH (ref 8–23)
CO2: 22 mmol/L (ref 22–32)
Calcium: 8 mg/dL — ABNORMAL LOW (ref 8.9–10.3)
Chloride: 110 mmol/L (ref 98–111)
Creatinine, Ser: 2.65 mg/dL — ABNORMAL HIGH (ref 0.61–1.24)
GFR calc Af Amer: 26 mL/min — ABNORMAL LOW (ref >60)
GFR calc non Af Amer: 22 mL/min — ABNORMAL LOW (ref >60)
Glucose, Bld: 183 mg/dL — ABNORMAL HIGH (ref 70–99)
Potassium: 3.1 mmol/L — ABNORMAL LOW (ref 3.5–5.1)
Sodium: 144 mmol/L (ref 135–145)

## 2019-12-22 LAB — GLUCOSE, CAPILLARY
Glucose-Capillary: 100 mg/dL — ABNORMAL HIGH (ref 70–99)
Glucose-Capillary: 121 mg/dL — ABNORMAL HIGH (ref 70–99)
Glucose-Capillary: 90 mg/dL (ref 70–99)
Glucose-Capillary: 196 mg/dL — ABNORMAL HIGH (ref 70–99)

## 2019-12-22 MED ORDER — ENSURE ENLIVE PO LIQD
237.0000 mL | Freq: Three times a day (TID) | ORAL | Status: DC
  Administered 2019-12-22 – 2019-12-27 (×13): 237 mL via ORAL

## 2019-12-22 MED ORDER — POTASSIUM CHLORIDE CRYS ER 20 MEQ PO TBCR
40.0000 meq | EXTENDED_RELEASE_TABLET | Freq: Once | ORAL | Status: AC
  Administered 2019-12-22: 40 meq via ORAL
  Filled 2019-12-22: qty 2

## 2019-12-22 NOTE — Consult Note (Signed)
   [facility] CM Inpatient Consult   12/22/2019  Jeremy Parrish [birth year] XO:[id number]    Patientscreened for long length of stay (LLOS) and 25% high risk score for unplanned readmission and hospitalization; and to check for potential [facility]) care management needs under his Medicare/ [employer] plan.  Patient had been outreached by [facility] RN care management coordinator for EMMI follow-up call in the distant past.   Review of medical record shows MDbrief narrativeas: Patient was initially admitted for decompensated atrial fibrillation with rapid ventricular response, complicated by cardiogenic and septic shock (pneumonia). He had prolonged hospital stay, requiring invasive mechanical ventilation and renal replacement therapy.   Primary Cable with Wende Neighbors MD office.  PT/ OTnotes reviewed, currentlyrecommending CIR (Comprehensive Inpatient Rehab). Transition of care CM note showing discharge plan for Rehab vs. Hospice depending on progress.  Will follow progress and disposition recommendations with inpatient transition of care team.  Please refer to [facility]) care management if there are needs identified for follow-up as appropriate.   For questions oradditional information,please contact:  Cola Highfill A. Almer Bushey, BSN, RN-BC [facility] Liaison Cell: [phone]

## 2019-12-22 NOTE — Plan of Care (Signed)
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 Palliative NP made aware

## 2019-12-22 NOTE — Progress Notes (Signed)
Nutrition Follow-up   RD working remotely.  DOCUMENTATION CODES:   Not applicable  INTERVENTION:  Provide Ensure Enlive po TID thickened as appropriate/tolerated, each supplement provides 350 kcal and 20 grams of protein.  NUTRITION DIAGNOSIS:   Inadequate oral intake related to acute illness as evidenced by NPO status; diet advanced, progressing  GOAL:   Patient will meet greater than or equal to 90% of their needs; progresing  MONITOR:   TF tolerance, Vent status, Labs, Weight trends  REASON FOR ASSESSMENT:   Consult, Ventilator Enteral/tube feeding initiation and management  ASSESSMENT:   79 yo male admitted with a/fib with RVR and developed shock with multiple organ failure, acute respiratory failure requiring intubation. PMH includes CHF, COPD, HTN, CAD, HLD, hx of multiple myeloma CRRT 2/4-2.5. MD not recommending chronic dialysis.  Extubated 2/5.  Pt is currently on a dysphagia 2 diet with nectar thick liquids. Pt fatigued, lethargic, and weak. RD to order nutritional supplements to aid in caloric and protein needs. Per Palliative, family has decided for home with hospice upon discharge.     Labs and medications reviewed.   Diet Order:   Diet Order            DIET DYS 2 Room service appropriate? Yes with Assist; Fluid consistency: Nectar Thick  Diet effective now              EDUCATION NEEDS:   Not appropriate for education at this time  Skin:  Skin Assessment: Skin Integrity Issues: Skin Integrity Issues:: Stage I Stage I: coccyx Other: MASD: abdomen, groin, perineum  Last BM:  2/10  Height:   Ht Readings from Last 1 Encounters:  12/09/19 5' 7"  (1.702 m)    Weight:   Wt Readings from Last 1 Encounters:  12/19/19 79.6 kg    Ideal Body Weight:     BMI:  Body mass index is 27.49 kg/m.  Estimated Nutritional Needs:   Kcal:  2000-2200  Protein:  100-115 grams  Fluid:  >/= 2 L/day  Corrin Parker, MS, RD, LDN RD pager number/after  hours weekend pager number on Amion.

## 2019-12-22 NOTE — Progress Notes (Signed)
Advanced Heart Failure Team Rounding Note   Primary Physician: Celene Squibb, MD PCP-Cardiologist:  Sanda Klein, MD   Requesting: Dr. Jacinta Shoe    HPI:    Remains very weak. Hospice discussions ongoing.  Currently sleeping but will arouse to voice. No CP or SOB.   Creatinine getting slighly better.    Past Medical History: Past Medical History:  Diagnosis Date  . Anemia   . Aortic aneurysm of unspecified site without mention of rupture   . Arthritis   . Bladder neck contracture   . Cancer ([place])   . Cerebral atherosclerosis    Carotid Doppler, 02/16/2013 - Bilateral Proximal ICAs,demonstrate mild plaque w/o evidence of significant diameter reduction, dissection, or any other vascular abnormality  . CHF (congestive heart failure) ([place])   . Complication of anesthesia   . COPD (chronic obstructive pulmonary disease) ([place])   . Coronary artery disease   . Depression   . Esophageal reflux   . Heart disease   . Heart murmur   . Hx of bladder cancer 10/07/2012  . Hyperlipidemia   . Hypertension   . 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 Intestinovesical fistula   . Kidney stones    history  . Lung mass   . Multiple myeloma   . Myocardial infarction [facility])    '96  . Non Hodgkin's lymphoma ([place])   . Paroxysmal atrial fibrillation ([place]) 01/02/2016  . Peripheral arterial disease ([place])   . Personal history of other diseases of circulatory system   . PONV (postoperative nausea and vomiting)   . Prostate cancer ([place]) 2000  . Shingles   . Shortness of breath   . Sleep apnea    05-02-14 cpap , not yet used- suggested settings 5  . Stroke [facility]) 2013   Speech.    Past Surgical History: Past Surgical History:  Procedure Laterality Date  . BLADDER SURGERY    . BONE MARROW TRANSPLANT  2011  . CARDIOVERSION N/A 06/02/2019   Procedure: CARDIOVERSION;  Surgeon: Sanda Klein, MD;  Location: [facility] ENDOSCOPY;   Service: Cardiovascular;  Laterality: N/A;  . COLON SURGERY     colon resection  . COLONOSCOPY N/A 01/01/2013   Procedure: COLONOSCOPY;  Surgeon: Rogene Houston, MD;  Location: [facility] ENDO SUITE;  Service: Endoscopy;  Laterality: N/A;  825-moved to [place] notified pt  . COLONOSCOPY N/A 07/16/2018   Procedure: COLONOSCOPY;  Surgeon: Rogene Houston, MD;  Location: [facility] ENDO SUITE;  Service: Endoscopy;  Laterality: N/A;  1:25  . CORONARY ANGIOPLASTY  06/24/2000   PCI and stenting in mid & proximal RCA  . heart stents x 5  1999  . INGUINAL HERNIA REPAIR Right 05/04/2014   Procedure: OPEN RIGHT INGUINAL HERNIA REPAIR with mesh;  Surgeon: Edward Jolly, MD;  Location: [facility] ORS;  Service: General;  Laterality: Right;  . INSERT / REPLACE / REMOVE PACEMAKER    . left ear skin cancer removed    . NM MYOCAR PERF WALL MOTION  11/27/2007   inferior scar  . PACEMAKER INSERTION  07/22/2011   [employer]  . POLYPECTOMY  07/16/2018   Procedure: POLYPECTOMY;  Surgeon: Rogene Houston, MD;  Location: [facility] ENDO SUITE;  Service: Endoscopy;;  colon  . PORTACATH PLACEMENT  07/26/2009   right chest  . PROSTATE SURGERY    . Rotator    . ROTATOR CUFF REPAIR Right   . SHOULDER ARTHROSCOPY WITH SUBACROMIAL DECOMPRESSION Right  07/21/2013   Procedure: RIGHT SHOULDER ARTHROSCOPY WITH SUBACROMIAL DECOMPRESSION AND DEBRIDEMENT & Injection of Left Shoulder;  Surgeon: Alta Corning, MD;  Location: [place];  Service: Orthopedics;  Laterality: Right;  . TEE WITHOUT CARDIOVERSION  10/13/2012   Procedure: TRANSESOPHAGEAL ECHOCARDIOGRAM (TEE);  Surgeon: Sanda Klein, MD;  Location: [facility] ENDOSCOPY;  Service: Cardiovascular;  Laterality: N/A;  pat/kay/echo notified  . US ECHOCARDIOGRAPHY  06/19/2011   RV mildly dilated,mild to mod. MR,mild AI,mild PI  . WRIST SURGERY     right    Family History: Family History  Problem Relation Age of Onset  . Cancer Father        bladder  . Heart disease Father        before age 40  . Hypertension  Mother   . Cancer Brother   . Heart disease Brother        before age 83  . Heart disease Sister        before age 91  . Hyperlipidemia Sister   . Hypertension Sister   . Heart attack Sister   . Colon cancer Neg Hx   . Colon polyps Neg Hx     Social History: Social History   Socioeconomic History  . Marital status: Married    Spouse name: [name]  . Number of children: 3  . Years of education: 9th  . Highest education level: Not on file  Occupational History  . Occupation: retired   Tobacco Use  . Smoking status: Former Smoker    Packs/day: 1.00    Years: 20.00    Pack years: 20.00    Types: Cigarettes    Quit date: 11/14/1994    Years since quitting: 25.1  . Smokeless tobacco: Never Used  Substance and Sexual Activity  . Alcohol use: No    Alcohol/week: 0.0 standard drinks    Comment: previously drank but none for at least 15 years.  . Drug use: No  . Sexual activity: Not on file  Other Topics Concern  . Not on file  Social History Narrative   Patient lives at home spouse.   Caffeine Use: Occasionally   Social Determinants of Health   Financial Resource Strain:   . Difficulty of Paying Living Expenses: Not on file  Food Insecurity:   . Worried About Charity fundraiser in the Last Year: Not on file  . Ran Out of Food in the Last Year: Not on file  Transportation Needs:   . Lack of Transportation (Medical): Not on file  . Lack of Transportation (Non-Medical): Not on file  Physical Activity:   . Days of Exercise per Week: Not on file  . Minutes of Exercise per Session: Not on file  Stress:   . Feeling of Stress : Not on file  Social Connections:   . Frequency of Communication with Friends and Family: Not on file  . Frequency of Social Gatherings with Friends and Family: Not on file  . Attends Religious Services: Not on file  . Active Member of Clubs or Organizations: Not on file  . Attends Archivist Meetings: Not on file  . Marital Status: Not  on file    Allergies:  Allergies  Allergen Reactions  . Morphine And Related Other (See Comments)    hallucinations  . Tape Rash    Paper tape is ok    Objective:    Vital Signs:   Temp:  [97.9 F (36.6 C)-98 F (36.7 C)] 98 F (36.7 C) (  02/10 0821) Pulse Rate:  [40-80] 67 (02/10 0821) Resp:  [16-20] 16 (02/10 0821) BP: (121-132)/(72-80) 132/80 (02/10 0821) SpO2:  [97 %-100 %] 100 % (02/10 0821) Last BM Date: 12/22/19  Weight change: Filed Weights   12/18/19 0500 12/19/19 0040 12/19/19 0422  Weight: 79 kg 79.6 kg 79.6 kg    Intake/Output:   Intake/Output Summary (Last 24 hours) at 12/22/2019 1502 Last data filed at 12/22/2019 0800 Gross per 24 hour  Intake --  Output 1000 ml  Net -1000 ml     Physical Exam    PHYSICAL EXAM: General:  Chronically ill/ weak appearing No respiratory difficulty HEENT: normal Neck: supple. no JVD. Carotids 2+ bilat; no bruits. No lymphadenopathy or thryomegaly appreciated. Cor: PMI nondisplaced. irregular rate & rhythm. No rubs, gallops or murmurs. + port Lungs: clear Abdomen: soft, nontender, nondistended. No hepatosplenomegaly. No bruits or masses. Good bowel sounds. Extremities: no cyanosis, clubbing, rash, edema Neuro: weak but arousable and follows command. + dysaarthria   Labs   Basic Metabolic Panel: Recent Labs  Lab 12/16/19 0036 12/16/19 0444 12/17/19 0343 12/17/19 0343 12/17/19 0344 12/17/19 1617 12/17/19 1617 12/18/19 0406 12/18/19 0406 12/18/19 1522 12/18/19 1522 12/19/19 0425 12/19/19 0425 12/20/19 0256 12/21/19 0205 12/22/19 0245  NA 155*   < > 148*   < >  --  143   < > 148*  147*   < > 143  --  143  --  143 142 144  K 3.1*   < > 4.1   < >  --  4.1   < > 4.5  4.5   < > 4.0  --  3.5  --  4.0 3.4* 3.1*  CL 112*   < > 111   < >  --  107   < > 111  108   < > 106  --  109  --  103 107 110  CO2 27   < > 26   < >  --  25   < > 24  24   < > 21*  --  21*  --  21* 22 22  GLUCOSE 175*   < > 154*   < >   --  130*   < > 194*  201*   < > 284*  --  118*  --  130* 111* 183*  BUN 177*   < > 114*   < >  --  105*   < > 117*  121*   < > 131*  --  121*  --  125* 112* 93*  CREATININE 3.70*   < > 2.15*   < >  --  2.13*   < > 2.43*  2.52*   < > 2.76*  --  2.48*  --  2.73* 2.70* 2.65*  CALCIUM 8.1*   < > 7.8*   < >  --  7.8*   < > 7.8*  8.2*   < > 8.0*   < > 7.1*   < > 8.1* 7.9* 8.0*  MG 2.5*  --   --   --  2.1  --   --  2.4  --   --   --  2.1  --  2.1  --   --   PHOS  --    < > 4.3  --   --  5.4*  --  6.9*  --  7.3*  --  6.2*  --   --   --   --    < > =  values in this interval not displayed.    Liver Function Tests: Recent Labs  Lab 12/17/19 0343 12/17/19 1617 12/18/19 0406 12/18/19 1522 12/19/19 0425  ALBUMIN 1.8* 1.9* 1.9* 1.8* 1.5*   No results for input(s): LIPASE, AMYLASE in the last 168 hours. No results for input(s): AMMONIA in the last 168 hours.  CBC: Recent Labs  Lab 12/16/19 0444 12/16/19 0444 12/16/19 1530 12/17/19 0344 12/18/19 0406 12/19/19 0425 12/20/19 1302  WBC 8.0  --   --  7.7 8.7 9.7 8.5  HGB 11.6*  --   --  11.4* 12.6* 11.2* 11.7*  HCT 37.4*  --   --  36.7* 39.9 34.3* 35.1*  MCV 88.4  --   --  87.8 85.8 82.7 81.8  PLT 45*   < > 40* 43* 48* 48* 57*   < > = values in this interval not displayed.    Cardiac Enzymes: No results for input(s): CKTOTAL, CKMB, CKMBINDEX, TROPONINI in the last 168 hours.  BNP: BNP (last 3 results) Recent Labs    12/09/19 0130  BNP 2,803.0*    ProBNP (last 3 results) No results for input(s): PROBNP in the last 8760 hours.   CBG: Recent Labs  Lab 12/21/19 1200 12/21/19 1648 12/21/19 2030 12/22/19 0822 12/22/19 1140  GLUCAP 140* 181* 169* 100* 121*    Coagulation Studies: No results for input(s): LABPROT, INR in the last 72 hours.   Imaging   No results found.   Medications:     Current Medications: . amiodarone  200 mg Oral BID  . atorvastatin  10 mg Oral q1800  . B-complex with vitamin C  1 tablet  Oral Daily  . cefdinir  300 mg Oral Q12H  . chlorhexidine gluconate (MEDLINE KIT)  15 mL Mouth Rinse BID  . Chlorhexidine Gluconate Cloth  6 each Topical Daily  . dextrose  50 mL Intravenous Once  . sodium chloride flush  3 mL Intravenous Q12H    Infusions: . sodium chloride Stopped (12/19/19 1112)    Assessment/Plan   1. Cardiogenic shock/ Septic Shock - EF 30-35% by echo (newly down) - Unclear etiology ? AF vs ischemia.   - Blood CX 1/29 - No growth  - Suspect major issue was PNA but did have several low co-ox suggestive of component of cardiogenic shock   2. Acute systolic HF - ECHO 09-60%. (previosuly 45%) - EF likely worse in setting of AF/shock - Not candidate for cath with AKI  - HF therapy limited by AKI and low BP  - Can add hydral/NTG as needed. - Volume status ok   3. Acute hypoxic respiratory failure - Extubated 2/5, much improved - CCM has signed of  4. CAD - s/p previous PCIs - hs trop 30 -> 43 -> 43. Not c/w ACS. No change   5. AKI - Due to ATN from shock - Creatinine stabilized at 2/7  6. Chronic A fib  - continue PO amiodarone 200 bid  - a/c currently on hold  - Platelets 57k  - Will repeat CBC. If platelets recover can start heparin   7. Multiple myeloma - [facility]  8. Dementia - unclear how severe this is  9. DNR/DNI   Overall improved. Doubt his cardiac function is major limiting issue in his functional capacity. Would continue supportive care. Add in HF meds slowly as tolerated.   Given multiple myeloma and comorbidities agree that Hospice care is liekly most appropriate.   Length of Stay: [place]  , MD  12/22/2019, 3:02 PM  Advanced Heart Failure Team Pager [phone] (M-F; 7a - 4p)  Please contact [facility] for night-coverage after hours (4p -7a ) and weekends on [email or web address]

## 2019-12-22 NOTE — Progress Notes (Signed)
Patient ID: Jeremy Parrish, male   DOB: 27-May-1941, 79 y.o.   MRN: [id number] [facility] Progress Note   Assessment/ Plan:   1. Acute kidney Injury on chronic kidney disease stage III (baseline creatinine 1.3-1.5): Appears to have suffered multifactorial acute kidney injury with cardiogenic shock/nephrotoxin exposure and transiently on CRRT for uremia between 2/4-2/5.  Fair urine output with slight improvement of creatinine/downtrending BUN indicative of renal recovery.  With his current clinical status, would not recommend chronic hemodialysis. 2.  Mixed septic/cardiogenic shock: Improving status post antibiotic therapy with transient pressor requirements.  Appreciate discussions between palliative care and family to decide on next steps including home with hospice. 3.  Moraxella pneumonia: Previous antibiotic therapy spectrum now narrowed down to oral cefdinir.  He remains afebrile and hemodynamically stable. 4.  Acute systolic heart failure: With fair urine output overnight 1.2 L and unchanged BUN/creatinine; without indications for dialysis and will hold diuretics for another 24 hours.  Subjective:   He states "okay" when asked how he is doing and then begins incorrigible mumbling.   Objective:   BP 121/72 (BP Location: Left Arm)   Pulse (!) 40   Temp 98 F (36.7 C)   Resp 20   Ht '5\' 7"'$  (1.702 m)   Wt 79.6 kg   SpO2 98%   BMI 27.49 kg/m  No intake or output data in the 24 hours ending 12/22/19 0725 Weight change:   Physical Exam: Gen: Appears confused, resting in bed.  Mumbling. CVS: Pulse irregularly irregular, S1 and S2 without murmur Resp: Coarse/transmitted breath sounds bilaterally without rales or rhonchi. Abd: Soft, flat, nontender Ext: No lower extremity edema.  Imaging: No results found.  Labs: BMET Recent Labs  Lab 12/16/19 1530 12/16/19 1530 12/16/19 2017 12/16/19 2017 12/17/19 0343 12/17/19 0343 12/17/19 1617 12/18/19 0406 12/18/19 1522  12/19/19 0425 12/20/19 0256 12/21/19 0205 12/22/19 0245  NA 153*   < > 151*   < > 148*   < > 143 148*  147* 143 143 143 142 144  K 3.3*   < > 3.5   < > 4.1   < > 4.1 4.5  4.5 4.0 3.5 4.0 3.4* 3.1*  CL 113*   < > 112*   < > 111   < > 107 111  108 106 109 103 107 110  CO2 27   < > 26   < > 26   < > '25 24  24 '$ 21* 21* 21* 22 22  GLUCOSE 195*   < > 177*   < > 154*   < > 130* 194*  201* 284* 118* 130* 111* 183*  BUN 158*   < > 140*   < > 114*   < > 105* 117*  121* 131* 121* 125* 112* 93*  CREATININE 3.08*   < > 2.72*   < > 2.15*   < > 2.13* 2.43*  2.52* 2.76* 2.48* 2.73* 2.70* 2.65*  CALCIUM 7.7*   < > 7.8*   < > 7.8*   < > 7.8* 7.8*  8.2* 8.0* 7.1* 8.1* 7.9* 8.0*  PHOS 4.9*  --  4.5  --  4.3  --  5.4* 6.9* 7.3* 6.2*  --   --   --    < > = values in this interval not displayed.   CBC Recent Labs  Lab 12/17/19 0344 12/18/19 0406 12/19/19 0425 12/20/19 1302  WBC 7.7 8.7 9.7 8.5  HGB 11.4* 12.6* 11.2* 11.7*  HCT 36.7*  39.9 34.3* 35.1*  MCV 87.8 85.8 82.7 81.8  PLT 43* 48* 48* 57*    Medications:    . amiodarone  200 mg Oral BID  . atorvastatin  10 mg Oral q1800  . B-complex with vitamin C  1 tablet Oral Daily  . cefdinir  300 mg Oral Q12H  . chlorhexidine gluconate (MEDLINE KIT)  15 mL Mouth Rinse BID  . Chlorhexidine Gluconate Cloth  6 each Topical Daily  . dextrose  50 mL Intravenous Once  . potassium chloride  40 mEq Oral Once  . sodium chloride flush  3 mL Intravenous Q12H   Elmarie Shiley, MD 12/22/2019, 7:25 AM

## 2019-12-22 NOTE — Plan of Care (Signed)

## 2019-12-22 NOTE — Progress Notes (Signed)
Palliative:  HPI: 79 yo male with PMH significant for aortic aneurysm, CAD, sick sinus syndrome, atrial fibrillation, COPD, depression, bladder cancer, multiple myeloma admitted from [facility] 12/09/19 with septic shock due to Moraxella pneumonia requiring transient intubation and CRRT. EF 30-35%.   I reached out to son, Jeremy Parrish, as I told him I would. He indicated desire to pursue placement for Jeremy Parrish where Jeremy Parrish could visit and be with him. He understands that these options may be limited. I told him that I would speak with Jeremy Parrish further about this.   I met at Jeremy Parrish bedside with Jeremy Parrish and Dr. Bonner Puna. Discussed with Parrish about our options and she does confirm their desire to focus on Jeremy comfort and that they would not want him to go anywhere where she would not be allowed to be with him. She also agrees that he is unlikely to benefit or participate in rehab program. Unfortunately he likely does not seem quite eligible for hospice facility although this could change quickly. I offered to discuss further with hospice to see if they would be willing to take him but Parrish has decided that she would rather have him at home. I offered family meeting if desired but Parrish feels that this is not necessary and that home is the best option.   We discussed further about how to manage him at home and how hospice can assist. I do feel that they will be much more comfortable and relaxed at home. He is likely to be resting in bed and will only need assistance when needing to be cleaned or assisted with bowel movement. Catheter may be placed to assist with urine and keep him clean. She does feel like this is the best of their options. We discussed potential from home to hospice facility if home becomes too much. We also discussed the benefits of having him at home and surrounded by family.   Parrish requested defibrillator to be deactivated but RN assisted to verify with [employer] that he has only  pacemaker and no AICD function.   I attempted to call and speak with daughter, Jeremy Parrish, per request. We were initially disconnected at the beginning of our conversation. I was unable to connect with her on future attempt later in the day.   All questions/concerns addressed. Emotional support provided.   Exam: Resting comfortably. No distress. Breathing regular, unlabored. Abd flat. Generalized weakness.   Plan: - Working towards home with hospice potentially Friday.  - Will follow up tomorrow.   [place], NP Palliative Medicine Team Pager [phone] (Please see [email or web address] for schedule) Team Phone [phone]    Greater than 50%  of this time was spent counseling and coordinating care related to the above assessment and plan

## 2019-12-22 NOTE — Progress Notes (Signed)
Rehab Admissions Coordinator Note:  Per PT and OT recommendation, this patient was screened by Raechel Ache for appropriateness for an Inpatient Acute Rehab Consult. Noted pt's wife has chosen home with hospice care at [facility]. [facility] will not pursue rehab consult order at this time.    Raechel Ache 12/22/2019, 2:55 PM  I can be reached at [phone].

## 2019-12-22 NOTE — Progress Notes (Signed)
PROGRESS NOTE  Jeremy Parrish  DSK:[id number] DOB: 07-14-1941 DOA: 12/09/2019 PCP: Celene Squibb, MD   Brief Narrative: Patient initially admitted for decompensated atrial fibrillation with rapid ventricular response, complicated by cardiogenic and septic shock (pneumonia). He had prolonged hospital stay, requiring invasive mechanical ventilation and renal replacement therapy.   79 year old male who presented to the hospital with worsening dyspnea. He does have significant past medical history for coronary artery disease, paroxysmal atrial fibrillation, sick sinus syndrome status post pacemaker, multiple myeloma, marginal zone lymphoma, COPD, hypertension and dyslipidemia. Patient was noted to have progressive dyspnea for 3 days, associated with agitation, bilateral extremity edema, and orthopnea. Patient symptoms were refractory to outpatient management with steroids. On his initial physical examination his heart rate was 122, temperature 97.6, blood pressure 134/87, oxygen saturation 99% on 2 L per nasal cannula. His lungs are clear to auscultation bilaterally, heart S1-S2, present, irregularly irregular, abdomen soft, 2+ bilateral lower extremity edema. Sodium 135, potassium 4.4, chloride 102, bicarb 22, glucose 128, BUN 36, creatinine 1.35.Chest radiograph with cardiomegaly and bibasilar atelectasis. EKG 121 bpm, left axis deviation, underlying rhythm atrial fibrillation with intermittent ventricular pacing no significant ST segment or T wave changes.  Patient was admitted to the hospital working diagnosis of atrial fibrillation with rapid ventricular response.  Patient developed cardiogenic shock, further work-up revealed newly depressed ejection fraction 30 to 35%.Patient was placed on vasopressors and mechanical ventilation.  Patient was further diagnosed with pneumonia due to Moraxella catarrhalis with sepsis (present on admission). Received broad-spectrum antibiotic  therapy.He suffered from acute tubular necrosis and underwent renal replacement therapy. Regained hemodynamic stability but remained on mechanical ventilation due to encephalopathy.  Patient was successfully extubated February 5.   Transferred to [facility] on 12/20/19. Patient continue to be very weak and deconditioned. Has remained stable from the cardiovascular and renal perspective, though due to persistent encephalopathy and profound weakness palliative care was consulted. It is not felt to be likely that he will be able to participate in rehabilitation efforts based on his current level of participation. His wife's primary desire is to be with her husband which is not permitted at facilities, so she opts to take him home with hospice services following.   Assessment & Plan: Principal Problem:   Atrial fibrillation with RVR (HCC) Active Problems:   Hx of lymphoma   Multiple myeloma in remission [facility])   Anxiety state   Essential hypertension   Coronary atherosclerosis   Lt CVA with expressive aphasia Nov 2013   H/O cardiac pacemaker, Medtronic REVO, MRI conditional device, placed 07/2011 for sympyomatic bradycardia   Hx of bladder cancer   Hyperlipidemia with target LDL less than 100   Prediabetes   Obstructive sleep apnea   COPD (chronic obstructive pulmonary disease) (HCC)   Acute exacerbation of CHF (congestive heart failure) (HCC)   Acute respiratory failure with hypoxia (HCC)   Cardiogenic shock (HCC)   Shock ([place])   Pressure injury of skin   Encounter for central line placement   Acute renal failure superimposed on chronic kidney disease ([place])   Palliative care by specialist   Goals of care, counseling/discussion  Acute metabolic encephalopathy with hypoactive delirium.  - Continue neuro checks per unit protocol and aspiration precautions.  - Dysphagia 2 diet.  - Continue as needed oxycodone for pain control.   Acute hypoxic respiratory failure: Resolved. PCCM signed  off.  CAD s/p PCI previously and demand ischemia: Troponin elevation not consistent with ACS. Not a candidiate for LHC with  renal dysfunction.  - Continue current plan of care per cardiology.   AKI on CKD stage 3b, ATN due to shock, present on admission. Hyponatremia. Temporary HD catheter removed 02/07.  - Continue monitor renal function which seems to be stabilizing. UOP picking up.  - Appreciate nephrology recommendations. Hemodialysis is not recommended long term with his clinical status.   Shock cardiogenic and septic (moraxella pneumonia present on admission). LV EF 30 to 35%, not candidate for advanced therapies. Resolved shock.  - Continue cefdinir.   Acute systolic heart failure in the setting of chronic atrial fibrillation with RVR/ NSVT.  - Tolerating well amiodarone.  - Continue holding anticoagulation due to elevated bleeding risk and thrombocytopenia.    Multiple myeloma:  - Treated at [facility]  T2DM: Well-controlled with Hgb A1c 5.6%.  - Glucose at control.   Hyperlipidemia:  - Continue statin  Stage 1 coccyx pressure ulcer, not present on admission. - Continue offloading as able, local wound care   Protein calorie malnutrition. Unspecified severity.  - Continue nutritional supplements.   DVT prophylaxis: SCDs Code Status: DNR Family Communication: Wife at bedside Disposition Plan: Home with hospice depending on clinical progress over next 24 hours. SNF was recommended though the patient's wife would not be allowed to visit in setting of covid-19 pandemic precautions, so she opts to take him home.  Consultants:   Cardiology  Nephrology  PCCM  Palliative care team  Procedures:   Right IJ CVC 01/29  Subjective: The patient is still eating and drinking a fair amount, just so weak he's not participating in rehabilitation efforts. Making urine, incontinent. Denies any pain. His cardiologist called and spoke with his wife about the contraindications to  catheterization and poor prognosis.  Objective: Vitals:   12/21/19 0753 12/21/19 1652 12/21/19 2323 12/22/19 0821  BP: 135/80 128/74 121/72 132/80  Pulse: 74 80 (!) 40 67  Resp: _0 Temp: (!) 97.5 F (36.4 C) 97.9 F (36.6 C) 98 F (36.7 C) 98 F (36.7 C)  TempSrc: Oral Axillary    SpO2: 96% 97% 98% 100%  Weight:      Height:        Intake/Output Summary (Last 24 hours) at 12/22/2019 1638 Last data filed at 12/22/2019 0800 Gross per 24 hour  Intake --  Output 1000 ml  Net -1000 ml   Filed Weights   12/18/19 0500 12/19/19 0040 12/19/19 0422  Weight: 79 kg 79.6 kg 79.6 kg   Gen: Acutely and chronically ill-appearing male laying peacefully Pulm: Non-labored breathing. Clear to auscultation bilaterally.  CV: Regular rate and rhythm. No murmur, rub, or gallop. No JVD, no pitting pedal edema. GI: Abdomen soft, non-tender, non-distended, with normoactive bowel sounds. No organomegaly or masses felt. Ext: Warm, no deformities Skin: No rashes, lesions or ulcers Neuro: Alert, follows commands.  Psych: Mood & affect appropriate.   Data Reviewed: I have personally reviewed following labs and imaging studies  CBC: Recent Labs  Lab 12/16/19 0444 12/16/19 0444 12/16/19 1530 12/17/19 0344 12/18/19 0406 12/19/19 0425 12/20/19 1302  WBC 8.0  --   --  7.7 8.7 9.7 8.5  HGB 11.6*  --   --  11.4* 12.6* 11.2* 11.7*  HCT 37.4*  --   --  36.7* 39.9 34.3* 35.1*  MCV 88.4  --   --  87.8 85.8 82.7 81.8  PLT 45*   < > 40* 43* 48* 48* 57*   < > = values in this interval not displayed.  Basic Metabolic Panel: Recent Labs  Lab 12/16/19 0036 12/16/19 0444 12/17/19 0343 12/17/19 0343 12/17/19 0344 12/17/19 1617 12/17/19 1617 12/18/19 0406 12/18/19 0406 12/18/19 1522 12/19/19 0425 12/20/19 0256 12/21/19 0205 12/22/19 0245  NA 155*   < > 148*   < >  --  143   < > 148*  147*   < > 143 143 143 142 144  K 3.1*   < > 4.1   < >  --  4.1   < > 4.5  4.5   < > 4.0 3.5 4.0  3.4* 3.1*  CL 112*   < > 111   < >  --  107   < > 111  108   < > 106 109 103 107 110  CO2 27   < > 26   < >  --  25   < > 24  24   < > 21* 21* 21* 22 22  GLUCOSE 175*   < > 154*   < >  --  130*   < > 194*  201*   < > 284* 118* 130* 111* 183*  BUN 177*   < > 114*   < >  --  105*   < > 117*  121*   < > 131* 121* 125* 112* 93*  CREATININE 3.70*   < > 2.15*   < >  --  2.13*   < > 2.43*  2.52*   < > 2.76* 2.48* 2.73* 2.70* 2.65*  CALCIUM 8.1*   < > 7.8*   < >  --  7.8*   < > 7.8*  8.2*   < > 8.0* 7.1* 8.1* 7.9* 8.0*  MG 2.5*  --   --   --  2.1  --   --  2.4  --   --  2.1 2.1  --   --   PHOS  --    < > 4.3  --   --  5.4*  --  6.9*  --  7.3* 6.2*  --   --   --    < > = values in this interval not displayed.   GFR: Estimated Creatinine Clearance: 23.6 mL/min (A) (by C-G formula based on SCr of 2.65 mg/dL (H)). Liver Function Tests: Recent Labs  Lab 12/17/19 0343 12/17/19 1617 12/18/19 0406 12/18/19 1522 12/19/19 0425  ALBUMIN 1.8* 1.9* 1.9* 1.8* 1.5*   Coagulation Profile: Recent Labs  Lab 12/16/19 1530  INR 1.7*   CBG: Recent Labs  Lab 12/21/19 1200 12/21/19 1648 12/21/19 2030 12/22/19 0822 12/22/19 1140  GLUCAP 140* 181* 169* 100* 121*   Urine analysis:    Component Value Date/Time   COLORURINE YELLOW 12/16/2019 1845   APPEARANCEUR HAZY (A) 12/16/2019 1845   LABSPEC 1.014 12/16/2019 1845   PHURINE 5.0 12/16/2019 1845   GLUCOSEU 50 (A) 12/16/2019 1845   HGBUR LARGE (A) 12/16/2019 1845   BILIRUBINUR NEGATIVE 12/16/2019 1845   KETONESUR NEGATIVE 12/16/2019 1845   PROTEINUR 30 (A) 12/16/2019 1845   NITRITE NEGATIVE 12/16/2019 1845   LEUKOCYTESUR NEGATIVE 12/16/2019 1845   Radiology Studies: No results found.  Scheduled Meds: . amiodarone  200 mg Oral BID  . atorvastatin  10 mg Oral q1800  . B-complex with vitamin C  1 tablet Oral Daily  . cefdinir  300 mg Oral Q12H  . chlorhexidine gluconate (MEDLINE KIT)  15 mL Mouth Rinse BID  . Chlorhexidine Gluconate  Cloth  6 each Topical Daily  .  dextrose  50 mL Intravenous Once  . feeding supplement (ENSURE ENLIVE)  237 mL Oral TID BM  . sodium chloride flush  3 mL Intravenous Q12H   Continuous Infusions: . sodium chloride Stopped (12/19/19 1112)     LOS: 13 days   Total time spent 45 minutes in seeing, examining patient, coordinating patient care, greater than 50% of which was spent on the floor and/or at the bedside.   Patrecia Pour, MD [facility] [email or web address] 12/22/2019, 4:38 PM

## 2019-12-22 NOTE — Progress Notes (Signed)
SLP Cancellation Note  Patient Details Name: CERGIO LANDAU MRN: [id number] DOB: 19-Jan-1941   Cancelled treatment:       Reason Eval/Treat Not Completed: Fatigue/lethargy limiting ability to participate.  RN reported that pt was too lethargic to participate in an MBS today.  Will re-attempt efforts tomorrow if pt is appropriate.     Colin Mulders., M.S., [place] Acute Rehabilitation Services Office: [phone]  [place] 12/22/2019, 1:31 PM

## 2019-12-22 NOTE — TOC Initial Note (Signed)
Transition of Care [facility]) - Initial/Assessment Note    Patient Details  Name: Jeremy Parrish MRN: [id number] Date of Birth: June 30, 1941  Transition of Care [facility]) CM/SW Contact:    Carles Collet, RN Phone Number: 12/22/2019, 2:48 PM  Clinical Narrative:       Jeremy Parrish w wife at bedside. She would like patient o go home w [place]. She states she may be interested in [facility] (their residential facility) in the future as patient declines.    Spoke w Cassandra at [facility] [phone]) who accepted referral. She will order oxygen, hospital bed and table. Wife understands that they [facility]) will be calling to set up delivery of items. She states that her 3 children who live in the area all have a key and will be able to help facilitate setting up the house. Will need [facility] for transport.            Expected Discharge Plan: Home w Hospice Care Barriers to Discharge: Continued Medical Work up   Patient Goals and CMS Choice Patient states their goals for this hospitalization and ongoing recovery are:: to g home w home hospice CMS Medicare.gov Compare Post Acute Care list provided to:: Other (Comment Required) Choice offered to / list presented to : Spouse  Expected Discharge Plan and Services Expected Discharge Plan: [place]   Discharge Planning Services: CM Consult   Living arrangements for the past 2 months: [place] Agency: [facility] Date [facility] Agency Contacted: 12/22/19 Time [place]: [phone] Representative spoke with at [place]: [place] Arrangements/Services Living arrangements for the past 2 months: [place] with:: Spouse Patient language and need for interpreter reviewed:: Yes        Need for Family Participation in Patient Care: Yes (Comment) Care giver support system in place?: Yes (comment)   Criminal  Activity/Legal Involvement Pertinent to Current Situation/Hospitalization: No - Comment as needed  Activities of Daily Living Home Assistive Devices/Equipment: None ADL Screening (condition at time of admission) Patient's cognitive ability adequate to safely complete daily activities?: Yes Is the patient deaf or have difficulty hearing?: No Does the patient have difficulty seeing, even when wearing glasses/contacts?: No Does the patient have difficulty concentrating, remembering, or making decisions?: No Patient able to express need for assistance with ADLs?: Yes Does the patient have difficulty dressing or bathing?: No Independently performs ADLs?: Yes (appropriate for developmental age) Does the patient have difficulty walking or climbing stairs?: No Weakness of Legs: None Weakness of Arms/Hands: None  Permission Sought/Granted                  Emotional Assessment           Psych Involvement: No (comment)  Admission diagnosis:  Atrial fibrillation with RVR ([place]) [I48.91] History of chronic CHF [Z86.79] Acute dyspnea [R06.00] Patient Active Problem List   Diagnosis Date Noted  . Palliative care by specialist   . Goals of care, counseling/discussion   . Encounter for central line placement   . Acute renal failure superimposed on chronic kidney disease ([place])   . Pressure injury of skin 12/14/2019  . Shock ([place])   . Acute respiratory failure with hypoxia ([place])   . Cardiogenic shock ([place])   . Acute exacerbation of  CHF (congestive heart failure) ([place]) 12/09/2019  . Atrial fibrillation with RVR ([place]) 12/09/2019  . Abdominal bloating 06/22/2019  . Other persistent atrial fibrillation ([place])   . History of colonic polyps 05/25/2018  . CAP (community acquired pneumonia) 03/11/2018  . COPD with acute exacerbation ([place]) 03/11/2018  . Vitamin B12 deficiency   . Long term current use of anticoagulant 12/25/2016  . COPD (chronic obstructive pulmonary disease) ([place]) 04/18/2016   . Paroxysmal atrial fibrillation ([place]) 01/02/2016  . Hypokalemia 11/03/2014  . URI (upper respiratory infection) 11/03/2014  . Weakness 11/03/2014  . Obstructive sleep apnea 08/07/2014  . Central sleep apnea 08/07/2014  . Inguinal hernia 05/04/2014  . Chest pain with moderate risk for cardiac etiology 11/25/2013  . Fever 09/13/2013  . Pneumonia 09/13/2013  . Pancytopenia ([place]) 09/13/2013  . Muscle weakness (generalized) 08/04/2013  . Status post arthroscopy of shoulder 08/04/2013  . Pain in joint, shoulder region 08/04/2013  . Rotator cuff tear arthropathy of right shoulder 07/21/2013  . Left rotator cuff tear arthropathy 07/21/2013  . Shortness of breath 07/07/2013  . 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 Diarrhea 12/14/2012  . Hyperlipidemia with target LDL less than 100 10/08/2012  . Prediabetes 10/08/2012  . Expressive aphasia 10/08/2012  . Hemiplegia affecting right dominant side ([place]) 10/08/2012  . 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 Hx of bladder cancer 10/07/2012  . 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 ASTHMA, UNSPECIFIED 03/22/2010  . Nonspecific (abnormal) findings on radiological and other examination of body structure 03/22/2010  . Hx of lymphoma 03/21/2010  . Multiple myeloma in remission ([place]) 03/21/2010  . Anxiety state 03/21/2010  . Essential hypertension 03/21/2010  . MYOCARDIAL INFARCTION 03/21/2010  . Coronary atherosclerosis 03/21/2010  . NEPHROLITHIASIS 03/21/2010  . ELEVATED PROSTATE SPECIFIC ANTIGEN 03/21/2010  . ROTATOR CUFF REPAIR, RIGHT, HX OF 03/21/2010   PCP:  Celene Squibb, MD Pharmacy:   [id number] [place] [address]Phone: [phone]: [address] [place]Phone: [phone]: [phone] [facility] [address] [place]Phone: [phone]: [phone]    Social Determinants of Health (SDOH) Interventions    Readmission Risk Interventions No flowsheet data found.

## 2019-12-23 ENCOUNTER — Inpatient Hospital Stay (HOSPITAL_COMMUNITY): Payer: Medicare Other

## 2019-12-23 LAB — MAGNESIUM: Magnesium: 1.5 mg/dL — ABNORMAL LOW (ref 1.7–2.4)

## 2019-12-23 LAB — CBC
HCT: 33 % — ABNORMAL LOW (ref 39.0–52.0)
Hemoglobin: 10.4 g/dL — ABNORMAL LOW (ref 13.0–17.0)
MCH: 27.2 pg (ref 26.0–34.0)
MCHC: 31.5 g/dL (ref 30.0–36.0)
MCV: 86.4 fL (ref 80.0–100.0)
Platelets: 66 10*3/uL — ABNORMAL LOW (ref 150–400)
RBC: 3.82 MIL/uL — ABNORMAL LOW (ref 4.22–5.81)
RDW: 20.5 % — ABNORMAL HIGH (ref 11.5–15.5)
WBC: 5.4 10*3/uL (ref 4.0–10.5)
nRBC: 0 % (ref 0.0–0.2)

## 2019-12-23 LAB — GLUCOSE, CAPILLARY
Glucose-Capillary: 95 mg/dL (ref 70–99)
Glucose-Capillary: 180 mg/dL — ABNORMAL HIGH (ref 70–99)
Glucose-Capillary: 253 mg/dL — ABNORMAL HIGH (ref 70–99)

## 2019-12-23 LAB — BASIC METABOLIC PANEL
Anion gap: 10 (ref 5–15)
BUN: 73 mg/dL — ABNORMAL HIGH (ref 8–23)
CO2: 25 mmol/L (ref 22–32)
Calcium: 7.7 mg/dL — ABNORMAL LOW (ref 8.9–10.3)
Chloride: 111 mmol/L (ref 98–111)
Creatinine, Ser: 2.12 mg/dL — ABNORMAL HIGH (ref 0.61–1.24)
GFR calc Af Amer: 34 mL/min — ABNORMAL LOW (ref >60)
GFR calc non Af Amer: 29 mL/min — ABNORMAL LOW (ref >60)
Glucose, Bld: 123 mg/dL — ABNORMAL HIGH (ref 70–99)
Potassium: 3.1 mmol/L — ABNORMAL LOW (ref 3.5–5.1)
Sodium: 146 mmol/L — ABNORMAL HIGH (ref 135–145)

## 2019-12-23 MED ORDER — LORAZEPAM 2 MG/ML IJ SOLN
0.5000 mg | Freq: Once | INTRAMUSCULAR | Status: AC
  Administered 2019-12-23: 0.5 mg via INTRAVENOUS
  Filled 2019-12-23: qty 1

## 2019-12-23 MED ORDER — POTASSIUM CHLORIDE CRYS ER 20 MEQ PO TBCR
20.0000 meq | EXTENDED_RELEASE_TABLET | Freq: Two times a day (BID) | ORAL | Status: AC
  Administered 2019-12-23 – 2019-12-24 (×3): 20 meq via ORAL
  Filled 2019-12-23 (×3): qty 1

## 2019-12-23 NOTE — Progress Notes (Signed)
Inpatient Rehabilitation-Admissions Coordinator   Met with pt and his wife at the bedside for IP Rehab assessment. Pt expressed a desire for rehab and wants the opportunity to get stronger and go home. Based on bedside assessment, feel pt is would be a good candidate for rehab in a few days. Wife initially hesitant to accept rehab but after discussion and presentation of program, is more on board with this recommendation. Discussed case with PM&R MD, Dr. Dagoberto Ligas who did bedside assessment as well.   Will follow up with pt and his wife tomorrow to see if they have any further questions or concerns about the program and as long as pt continues to show progress with therapies will continue to pursue IP Rehab admission in the next few days.   Raechel Ache, OTR/L  Rehab Admissions Coordinator  [phone] 12/23/2019 4:05 PM

## 2019-12-23 NOTE — Progress Notes (Signed)
PROGRESS NOTE  Jeremy Parrish  ION:[id number] DOB: [birth year] DOA: 12/09/2019 PCP: Celene Squibb, MD   Brief Narrative: Patient initially admitted for decompensated atrial fibrillation with rapid ventricular response, complicated by cardiogenic and septic shock (pneumonia). He had prolonged hospital stay, requiring invasive mechanical ventilation and renal replacement therapy.   79 year old male who presented to the hospital with worsening dyspnea. He does have significant past medical history for coronary artery disease, paroxysmal atrial fibrillation, sick sinus syndrome status post pacemaker, multiple myeloma, marginal zone lymphoma, COPD, hypertension and dyslipidemia. Patient was noted to have progressive dyspnea for 3 days, associated with agitation, bilateral extremity edema, and orthopnea. Patient symptoms were refractory to outpatient management with steroids. On his initial physical examination his heart rate was 122, temperature 97.6, blood pressure 134/87, oxygen saturation 99% on 2 L per nasal cannula. His lungs are clear to auscultation bilaterally, heart S1-S2, present, irregularly irregular, abdomen soft, 2+ bilateral lower extremity edema. Sodium 135, potassium 4.4, chloride 102, bicarb 22, glucose 128, BUN 36, creatinine 1.35.Chest radiograph with cardiomegaly and bibasilar atelectasis. EKG 121 bpm, left axis deviation, underlying rhythm atrial fibrillation with intermittent ventricular pacing no significant ST segment or T wave changes.  Patient was admitted to the hospital working diagnosis of atrial fibrillation with rapid ventricular response.  Patient developed cardiogenic shock, further work-up revealed newly depressed ejection fraction 30 to 35%.Patient was placed on vasopressors and mechanical ventilation.  Patient was further diagnosed with pneumonia due to Moraxella catarrhalis with sepsis (present on admission). Received broad-spectrum antibiotic  therapy.He suffered from acute tubular necrosis and underwent renal replacement therapy. Regained hemodynamic stability but remained on mechanical ventilation due to encephalopathy.  Patient was successfully extubated February 5.   Transferred to [facility] on 12/20/19. Patient continue to be very weak and deconditioned. Has remained stable from the cardiovascular and renal perspective, though due to persistent encephalopathy and profound weakness palliative care was consulted. It is not felt to be likely that he will be able to participate in rehabilitation efforts based on his current level of participation. His wife's primary desire is to be with her husband which is not permitted at facilities, so she opts to take him home with hospice services following.   Assessment & Plan: Principal Problem:   Atrial fibrillation with RVR (HCC) Active Problems:   Hx of lymphoma   Multiple myeloma in remission [facility])   Anxiety state   Essential hypertension   Coronary atherosclerosis   Lt CVA with expressive aphasia Nov 2013   H/O cardiac pacemaker, [employer] REVO, MRI conditional device, placed 07/2011 for sympyomatic bradycardia   Hx of bladder cancer   Hyperlipidemia with target LDL less than 100   Prediabetes   Obstructive sleep apnea   COPD (chronic obstructive pulmonary disease) (HCC)   Acute exacerbation of CHF (congestive heart failure) (HCC)   Acute respiratory failure with hypoxia (HCC)   Cardiogenic shock (HCC)   Shock ([place])   Pressure injury of skin   Encounter for central line placement   Acute renal failure superimposed on chronic kidney disease ([place])   Palliative care by specialist   Goals of care, counseling/discussion  Acute metabolic encephalopathy with hypoactive delirium: Improving, possibly due to improving renal function, presence of family, recovery from severe infection. - Dysphagia 2 diet.  - Continue as needed oxycodone for pain control.   Acute hypoxic respiratory  failure: Resolved. PCCM signed off.  CAD s/p PCI previously and demand ischemia: Troponin elevation not consistent with ACS. Not a candidiate for  LHC with renal dysfunction.  - Continue current plan of care per cardiology.   AKI on CKD stage 3b, ATN due to shock, present on admission. Hyponatremia. Temporary HD catheter removed 02/07.  - Continue monitor renal function which continues to improve. UOP picking up.  - Appreciate nephrology recommendations. Hemodialysis is not recommended long term with his clinical status.   Shock cardiogenic and septic (moraxella pneumonia present on admission). LV EF 30 to 35%, not candidate for advanced therapies. Resolved shock.  - Continue cefdinir.   Acute systolic heart failure in the setting of chronic atrial fibrillation with RVR/ NSVT.  - Tolerating well amiodarone.  - Continue holding anticoagulation due to elevated bleeding risk and thrombocytopenia.    Multiple myeloma:  - Treated at [facility]  T2DM: Well-controlled with Hgb A1c 5.6%.  - Glucose at control.   Hyperlipidemia:  - Continue statin  Stage 1 coccyx pressure ulcer, not present on admission. - Continue offloading as able, local wound care   Protein calorie malnutrition. Unspecified severity. Taking better po however. - Continue nutritional supplements.   DVT prophylaxis: SCDs Code Status: DNR Family Communication: Wife at bedside Disposition Plan: CIR has been recommended, though with decreased participation previously was not felt to be a great candidate and since patient's wife did not want him to go to SNF because she would be unable to visit him, we were going to discharge home. Today his clinical picture appears to represent a more promising trajectory, and it appears the patient would be amenable and benefitted from PT/OT in CIR. We will consult PM&R formally for evaluation.   Consultants:   Cardiology  Nephrology  PCCM  Palliative care team  Procedures:    Right IJ CVC 01/29  Subjective: He continues to eat without issues, drinking well, urine output improving, labs looking generally better. Denies pain, is more engaging than yesterday. He states he does feel he could participate in rehabilitation.   Objective: Vitals:   12/22/19 1640 12/22/19 2318 12/23/19 0520 12/23/19 0803  BP: 101/84 121/67  128/63  Pulse: 77 66  75  Resp: (!) 24   17  Temp: 98.8 F (37.1 C) 98 F (36.7 C)  98.6 F (37 C)  TempSrc:      SpO2: 98% 100%  100%  Weight:   74.8 kg   Height:        Intake/Output Summary (Last 24 hours) at 12/23/2019 1524 Last data filed at 12/23/2019 0515 Gross per 24 hour  Intake 0 ml  Output 1450 ml  Net -1450 ml   Filed Weights   12/19/19 0040 12/19/19 0422 12/23/19 0520  Weight: 79.6 kg 79.6 kg 74.8 kg   Gen: Chronically ill-appearing male in no distress Pulm: Nonlabored breathing room air. Clearing mostly upper airway crackles bilaterally without wheezes. CV: Irreg irreg. No murmur, rub, or gallop. No JVD, no dependent edema. GI: Abdomen soft, non-tender, non-distended, with normoactive bowel sounds.  Ext: Warm, no deformities Skin: No rashes, lesions or ulcers on visualized skin. Neuro: Alert, more verbal than he has been in a long while. No focal neurological deficits. Psych: Mood euthymic & affect congruent. Behavior is appropriate.    Data Reviewed: I have personally reviewed following labs and imaging studies  CBC: Recent Labs  Lab 12/17/19 0344 12/18/19 0406 12/19/19 0425 12/20/19 1302 12/23/19 0226  WBC 7.7 8.7 9.7 8.5 5.4  HGB 11.4* 12.6* 11.2* 11.7* 10.4*  HCT 36.7* 39.9 34.3* 35.1* 33.0*  MCV 87.8 85.8 82.7 81.8 86.4  PLT 43*  48* 48* 57* 66*   Basic Metabolic Panel: Recent Labs  Lab 12/17/19 0343 12/17/19 0343 12/17/19 0344 12/17/19 1617 12/17/19 1617 12/18/19 0406 12/18/19 0406 12/18/19 1522 12/18/19 1522 12/19/19 0425 12/20/19 0256 12/21/19 0205 12/22/19 0245 12/23/19 0226  12/23/19 0858  NA 148*   < >  --  143   < > 148*  147*   < > 143   < > 143 143 142 144 146*  --   K 4.1   < >  --  4.1   < > 4.5  4.5   < > 4.0   < > 3.5 4.0 3.4* 3.1* 3.1*  --   CL 111   < >  --  107   < > 111  108   < > 106   < > 109 103 107 110 111  --   CO2 26   < >  --  25   < > 24  24   < > 21*   < > 21* 21* '22 22 25  '$ --   GLUCOSE 154*   < >  --  130*   < > 194*  201*   < > 284*   < > 118* 130* 111* 183* 123*  --   BUN 114*   < >  --  105*   < > 117*  121*   < > 131*   < > 121* 125* 112* 93* 73*  --   CREATININE 2.15*   < >  --  2.13*   < > 2.43*  2.52*   < > 2.76*   < > 2.48* 2.73* 2.70* 2.65* 2.12*  --   CALCIUM 7.8*   < >  --  7.8*   < > 7.8*  8.2*   < > 8.0*   < > 7.1* 8.1* 7.9* 8.0* 7.7*  --   MG  --   --  2.1  --   --  2.4  --   --   --  2.1 2.1  --   --   --  1.5*  PHOS 4.3  --   --  5.4*  --  6.9*  --  7.3*  --  6.2*  --   --   --   --   --    < > = values in this interval not displayed.   GFR: Estimated Creatinine Clearance: 27.3 mL/min (A) (by C-G formula based on SCr of 2.12 mg/dL (H)). Liver Function Tests: Recent Labs  Lab 12/17/19 0343 12/17/19 1617 12/18/19 0406 12/18/19 1522 12/19/19 0425  ALBUMIN 1.8* 1.9* 1.9* 1.8* 1.5*   Coagulation Profile: Recent Labs  Lab 12/16/19 1530  INR 1.7*   CBG: Recent Labs  Lab 12/22/19 1140 12/22/19 1638 12/22/19 2315 12/23/19 0805 12/23/19 1232  GLUCAP 121* 90 196* 95 180*   Urine analysis:    Component Value Date/Time   COLORURINE YELLOW 12/16/2019 1845   APPEARANCEUR HAZY (A) 12/16/2019 1845   LABSPEC 1.014 12/16/2019 1845   PHURINE 5.0 12/16/2019 1845   GLUCOSEU 50 (A) 12/16/2019 1845   HGBUR LARGE (A) 12/16/2019 1845   BILIRUBINUR NEGATIVE 12/16/2019 1845   KETONESUR NEGATIVE 12/16/2019 1845   PROTEINUR 30 (A) 12/16/2019 1845   NITRITE NEGATIVE 12/16/2019 1845   LEUKOCYTESUR NEGATIVE 12/16/2019 1845   Radiology Studies: No results found.  Scheduled Meds: . amiodarone  200 mg Oral BID  .  atorvastatin  10 mg Oral q1800  . B-complex with vitamin  C  1 tablet Oral Daily  . cefdinir  300 mg Oral Q12H  . chlorhexidine gluconate (MEDLINE KIT)  15 mL Mouth Rinse BID  . Chlorhexidine Gluconate Cloth  6 each Topical Daily  . dextrose  50 mL Intravenous Once  . feeding supplement (ENSURE ENLIVE)  237 mL Oral TID BM  . potassium chloride  20 mEq Oral BID  . sodium chloride flush  3 mL Intravenous Q12H   Continuous Infusions: . sodium chloride Stopped (12/19/19 1112)     LOS: 14 days   Time spent: 35 minutes.   Patrecia Pour, MD [facility] [email or web address] 12/23/2019, 3:24 PM

## 2019-12-23 NOTE — Progress Notes (Signed)
Occupational Therapy Treatment Patient Details Name: Jeremy Parrish MRN: [id number] DOB: 1941-08-08 Today's Date: 12/23/2019    History of present illness Pt is a 79 y.o. male admitted 12/09/19 with cardiogenic and septic shock of unclear etiology; AF vs ischemia; complicated by worsening renal function. ETT 1/29-2/5. Required CRRT 2/4-2/5. PMH includes CVA w/ resultant aphasia (2013), multiple myeloma, bladder CA, prostate CA, COPD, HTN, R rotator cuff repair.   OT comments  Patient seen for OT/PT session.  Completing bed mobility and transfers with mod assist +2 today. LB ADLs with total assist +2, drinking (nectar thick) with mod assist for proximal support of R UE. Patient demonstrating increased functional ROM of BUEs today, improved initiation and sequencing, and balance (min guard at EOB). He fatigues easily but is highly motivated.  Continue to recommend post acute rehab to optimize strength, endurance and decrease burden of care prior to dc home.    Follow Up Recommendations  CIR;Supervision/Assistance - 24 hour    Equipment Recommendations  3 in 1 bedside commode    Recommendations for Other Services Rehab consult    Precautions / Restrictions Precautions Precautions: Fall;Other (comment) Precaution Comments: H/o expressive aphasia; bowel incontinence Restrictions Weight Bearing Restrictions: No       Mobility Bed Mobility Overal bed mobility: Needs Assistance Bed Mobility: Supine to Sit     Supine to sit: Mod assist;+2 for physical assistance;+2 for safety/equipment     General bed mobility comments: assist for BLEs towards EOB, elevate trunk and scoot hips; pt able to initate but requires assist to complete movement due to weakness   Transfers Overall transfer level: Needs assistance Equipment used: 2 person hand held assist Transfers: Sit to/from [employer] Sit to Stand: Mod assist;+2 physical assistance;+2 safety/equipment   Squat pivot  transfers: Mod assist;+2 physical assistance;+2 safety/equipment     General transfer comment: mod assist +2 to power up and steady, pivot to recliner; limited tolerance    Balance Overall balance assessment: Needs assistance Sitting-balance support: Bilateral upper extremity supported;Feet supported Sitting balance-Leahy Scale: Poor Sitting balance - Comments: seated EOB with min guard, pt preference to UE support    Standing balance support: Bilateral upper extremity supported;During functional activity Standing balance-Leahy Scale: Poor Standing balance comment: relaint on external support                           ADL either performed or assessed with clinical judgement   ADL Overall ADL's : Needs assistance/impaired Eating/Feeding: Moderate assistance;Sitting Eating/Feeding Details (indicate cue type and reason): assist to bring hand to mouth due to proximal weakness when drinking nectar thick liquids, able to hold drink with out assist (R hand)                 Lower Body Dressing: Sit to/from stand;Total assistance;+2 for physical assistance Lower Body Dressing Details (indicate cue type and reason): assist to don socks, mod assist +2 sit to stand  Toilet Transfer: Moderate assistance;+2 for physical assistance;+2 for safety/equipment;Squat-pivot Toilet Transfer Details (indicate cue type and reason): simulated to recliner          Functional mobility during ADLs: +2 for physical assistance;+2 for safety/equipment;Moderate assistance General ADL Comments: limited by weakness, decreased activity tolerance      Vision       Perception     Praxis      Cognition Arousal/Alertness: Awake/alert Behavior During Therapy: Flat affect Overall Cognitive Status: Difficult to assess  General Comments: hx of expressive aphasia but improved verbalizations today; following commands with increased time and improved  initation in tasks today        Exercises Exercises: General Upper Extremity General Exercises - Upper Extremity Shoulder Flexion: AAROM;5 reps;Both;Supine   Shoulder Instructions       General Comments 6L Crane with VSS; spouse present during session     Pertinent Vitals/ Pain       Pain Assessment: No/denies pain  Home Living                                          Prior Functioning/Environment              Frequency  Min 3X/week        Progress Toward Goals  OT Goals(current goals can now be found in the care plan section)  Progress towards OT goals: Progressing toward goals  Acute Rehab OT Goals Patient Stated Goal: home  OT Goal Formulation: With patient  Plan Discharge plan remains appropriate;Frequency remains appropriate    Co-evaluation    PT/OT/SLP Co-Evaluation/Treatment: Yes Reason for Co-Treatment: For patient/therapist safety;To address functional/ADL transfers   OT goals addressed during session: ADL's and self-care;Strengthening/ROM      AM-PAC OT "6 Clicks" Daily Activity     Outcome Measure   Help from another person eating meals?: A Lot Help from another person taking care of personal grooming?: A Lot Help from another person toileting, which includes using toliet, bedpan, or urinal?: Total Help from another person bathing (including washing, rinsing, drying)?: A Lot Help from another person to put on and taking off regular upper body clothing?: A Lot Help from another person to put on and taking off regular lower body clothing?: Total 6 Click Score: 10    End of Session Equipment Utilized During Treatment: Oxygen(6L)  OT Visit Diagnosis: Other abnormalities of gait and mobility (R26.89);Muscle weakness (generalized) (M62.81);Other symptoms and signs involving cognitive function;Cognitive communication deficit (R41.841) Symptoms and signs involving cognitive functions: Cerebral infarction(old)   Activity  Tolerance Patient tolerated treatment well   Patient Left in chair;with call bell/phone within reach;with chair alarm set   Nurse Communication Mobility status        Time: 4144-3601 OT Time Calculation (min): 24 min  Charges: OT General Charges $OT Visit: 1 Visit OT Treatments $Self Care/Home Management : 8-22 mins  [place] Pager [phone] Office [phone]    Delight Stare 12/23/2019, 1:52 PM

## 2019-12-23 NOTE — Progress Notes (Signed)
MD asking for CIR to resee and evaluate the patient. CM has reached out to [place] with CIR and they will meet with patient and wife today.  TOC following.

## 2019-12-23 NOTE — Progress Notes (Signed)
SLP Cancellation Note  Patient Details Name: Jeremy Parrish MRN: [id number] DOB: 05/09/1941   Cancelled treatment:       Reason Eval/Treat Not Completed: Other (comment). Given plan to d/c home with hospice and ongoing lethargy, will defer any further SLP work up and plan for MBS. Will sign off at this time.    Leander Tout, Katherene Ponto 12/23/2019, 8:12 AM

## 2019-12-23 NOTE — Progress Notes (Signed)
Palliative:  HPI: 79 yo male with PMH significant for aortic aneurysm, CAD, sick sinus syndrome, atrial fibrillation, COPD, depression, bladder cancer, multiple myeloma admitted from [facility] 12/09/19 with septic shock due to Moraxella pneumonia requiring transient intubation and CRRT. EF 30-35%.   I met today at Jeremy Parrish bedside with he and his wife. She tells me that now they are looking more into CIR and she is a little frustrated with conflicting recommendations. We spent some time discussing this and the fact that Jeremy Parrish has progressed better than expected but that we know this could all change but we do not know when. He is high risk for decline but this could be tomorrow, next week, or next month and difficult to know. However, we discussed that CIR (if found to be a candidate) could be a good idea to see how much he can improve. I can continue to follow and support and help to get him home at any stage if this is their desire or if rehab is not benefiting him as expected. We also discussed that home with hospice would be a secondary plan as we both agree he would not thrive if her were in an environment where she was unable to be with him. We discussed that this will work out how it is meant to be and either way we have a plan that will support the both of them. Wife seemed more relaxed after our conversation.   I called son, Jeremy Parrish, and explained the above. He believes CIR would be a good plan if this works out. I explained that this is still under evaluation and has not been officially approved and offered. He understands and agrees with above plan as discussed with his mother.   All questions/concerns addressed. Emotional support provided. Discussed with Dr. Bonner Puna and [facility] [name].   Exam: Alert, seems more oriented (although fluctuating orientation). No distress. Breathing regular, unlabored. Generalized weakness.   Plan: - Hopeful for CIR. I will continue to follow up on Monday  12/26/19. Please call [phone] for acute palliative needs over the weekend.  - If CIR is not an option family would opt for home with hospice.   [place], NP Palliative Medicine Team Pager [phone] (Please see [email or web address] for schedule) Team Phone [phone]    Greater than 50%  of this time was spent counseling and coordinating care related to the above assessment and plan

## 2019-12-23 NOTE — Progress Notes (Addendum)
On call paged re: pt not able to be reoriented, not sure where he is, continuously yelling "mama", trying to get out of bed  0030: Pt was yelling - when asked what he saw in the room besides [place], he pointed to the ceiling and began whispering, looking intensely at the ceiling  0036: On call repaged - pt yelling, smacking bed relentlessly, trying to get out of bed. Previous one time 0.5mg  Ativan via IV ineffective at relieving pt's anxiety.

## 2019-12-23 NOTE — Progress Notes (Signed)
Patient ID: Jeremy Parrish, male   DOB: 07-09-1941, 79 y.o.   MRN: [id number] [facility] Progress Note   Assessment/ Plan:   1. Acute kidney Injury on chronic kidney disease stage III (baseline creatinine 1.3-1.5): Appears to have suffered multifactorial acute kidney injury with cardiogenic shock/nephrotoxin exposure and transiently on CRRT for uremia between 2/4-2/5.  Excellent urine output overnight with continued downtrending creatinine; no indications for dialysis.  Given his overall picture, would not recommend chronic hemodialysis as it would likely hasten his decline. 2.  Mixed septic/cardiogenic shock: Improving status post antibiotic therapy with transient pressor requirements.  Appreciate discussions between palliative care and family to decide on next steps including home with hospice. 3.  Moraxella pneumonia: Previous antibiotic therapy spectrum now narrowed down to oral cefdinir.  He remains afebrile and hemodynamically stable. 4.  Acute systolic heart failure: Improving urine output with no significant edema on exam, appears prudent to continue to hold diuretics at this point. 5.  Hypokalemia: We will replace via oral route.  Subjective:   Nodding "no" when asked whether he is in pain or having any shortness of breath.  No acute events noted overnight.   Objective:   BP 121/67 (BP Location: Right Arm)   Pulse 66   Temp 98 F (36.7 C)   Resp (!) 24   Ht '5\' 7"'$  (1.702 m)   Wt 74.8 kg   SpO2 100%   BMI 25.83 kg/m   Intake/Output Summary (Last 24 hours) at 12/23/2019 1505 Last data filed at 12/23/2019 0515 Gross per 24 hour  Intake 0 ml  Output 2450 ml  Net -2450 ml   Weight change:   Physical Exam: Gen: Awake, alert, more communicative than I have seen him in the past 2 days CVS: Pulse irregularly irregular, S1 and S2 without murmur Resp: Coarse/transmitted breath sounds bilaterally without rales or rhonchi. Abd: Soft, flat, nontender Ext: No lower  extremity edema.  Imaging: No results found.  Labs: BMET Recent Labs  Lab 12/16/19 1530 12/16/19 1530 12/16/19 2017 12/16/19 2017 12/17/19 0343 12/17/19 0343 12/17/19 1617 12/17/19 1617 12/18/19 0406 12/18/19 1522 12/19/19 0425 12/20/19 0256 12/21/19 0205 12/22/19 0245 12/23/19 0226  NA 153*   < > 151*   < > 148*   < > 143   < > 148*  147* 143 143 143 142 144 146*  K 3.3*   < > 3.5   < > 4.1   < > 4.1   < > 4.5  4.5 4.0 3.5 4.0 3.4* 3.1* 3.1*  CL 113*   < > 112*   < > 111   < > 107   < > 111  108 106 109 103 107 110 111  CO2 27   < > 26   < > 26   < > 25   < > 24  24 21* 21* 21* '22 22 25  '$ GLUCOSE 195*   < > 177*   < > 154*   < > 130*   < > 194*  201* 284* 118* 130* 111* 183* 123*  BUN 158*   < > 140*   < > 114*   < > 105*   < > 117*  121* 131* 121* 125* 112* 93* 73*  CREATININE 3.08*   < > 2.72*   < > 2.15*   < > 2.13*   < > 2.43*  2.52* 2.76* 2.48* 2.73* 2.70* 2.65* 2.12*  CALCIUM 7.7*   < > 7.8*   < >  7.8*   < > 7.8*   < > 7.8*  8.2* 8.0* 7.1* 8.1* 7.9* 8.0* 7.7*  PHOS 4.9*  --  4.5  --  4.3  --  5.4*  --  6.9* 7.3* 6.2*  --   --   --   --    < > = values in this interval not displayed.   CBC Recent Labs  Lab 12/18/19 0406 12/19/19 0425 12/20/19 1302 12/23/19 0226  WBC 8.7 9.7 8.5 5.4  HGB 12.6* 11.2* 11.7* 10.4*  HCT 39.9 34.3* 35.1* 33.0*  MCV 85.8 82.7 81.8 86.4  PLT 48* 48* 57* 66*    Medications:    . amiodarone  200 mg Oral BID  . atorvastatin  10 mg Oral q1800  . B-complex with vitamin C  1 tablet Oral Daily  . cefdinir  300 mg Oral Q12H  . chlorhexidine gluconate (MEDLINE KIT)  15 mL Mouth Rinse BID  . Chlorhexidine Gluconate Cloth  6 each Topical Daily  . dextrose  50 mL Intravenous Once  . feeding supplement (ENSURE ENLIVE)  237 mL Oral TID BM  . sodium chloride flush  3 mL Intravenous Q12H   Elmarie Shiley, MD 12/23/2019, 7:17 AM

## 2019-12-23 NOTE — Progress Notes (Signed)
Physical Therapy Treatment Patient Details Name: Jeremy Parrish MRN: [id number] DOB: [birth year] Today's Date: 12/23/2019    History of Present Illness Pt is a 79 y.o. male admitted 12/09/19 with cardiogenic and septic shock of unclear etiology; AF vs ischemia; complicated by worsening renal function. ETT 1/29-2/5. Required CRRT 2/4-2/5. PMH includes CVA w/ resultant aphasia (2013), multiple myeloma, bladder CA, prostate CA, COPD, HTN, R rotator cuff repair.    PT Comments    Pt was seen for mobility to side of bed then to pivot transfer to chair.  O2 sats were stable over 90% but on 6L O2 to attain this.  Had ROM to arms and legs in the bed, and with his tolerance of the effort could still get to side of bed and chair with help from PT and OT.  Follow acutely as planned to work on his ability to assist with standing, to increase standing tolerance and increase safety in movement to ease his transition to home.  Wife wishes to consider rehab and pt wants to get home.   Follow Up Recommendations  CIR;Supervision for mobility/OOB     Equipment Recommendations  Other (comment)    Recommendations for Other Services       Precautions / Restrictions Precautions Precautions: Fall;Other (comment) Precaution Comments: expressive aphasia Restrictions Weight Bearing Restrictions: No    Mobility  Bed Mobility Overal bed mobility: Needs Assistance Bed Mobility: Sidelying to Sit;Rolling;Supine to Sit Rolling: Mod assist;+2 for physical assistance;+2 for safety/equipment Sidelying to sit: Mod assist;+2 for physical assistance;+2 for safety/equipment Supine to sit: Mod assist;+2 for physical assistance;+2 for safety/equipment     General bed mobility comments: assisting legs then trunk with bedpad  Transfers Overall transfer level: Needs assistance Equipment used: 2 person hand held assist Transfers: Sit to/from [employer] Transfers Sit to Stand: Mod assist;+2 physical assistance;+2  safety/equipment   Squat pivot transfers: Mod assist;+2 physical assistance;+2 safety/equipment     General transfer comment: pt is unable to take a step to chair but can be pivoted from standing power up  Ambulation/Gait             General Gait Details: unable   Stairs             Wheelchair Mobility    Modified Rankin (Stroke Patients Only)       Balance Overall balance assessment: Needs assistance Sitting-balance support: Bilateral upper extremity supported;Feet supported Sitting balance-Leahy Scale: Poor Sitting balance - Comments: EOB with balance support but can shift hips to help scoot to EOB Postural control: Posterior lean;Right lateral lean Standing balance support: Bilateral upper extremity supported;During functional activity Standing balance-Leahy Scale: Poor Standing balance comment: relaint on external support                            Cognition Arousal/Alertness: Awake/alert Behavior During Therapy: Flat affect Overall Cognitive Status: Difficult to assess                                 General Comments: verbalization is understandable with greater volume when encouraged      Exercises General Exercises - Upper Extremity Shoulder Flexion: AAROM;5 reps General Exercises - Lower Extremity Ankle Circles/Pumps: AROM;AAROM;Both;10 reps Heel Slides: AAROM;Both;10 reps Hip ABduction/ADduction: AAROM;Both;10 reps    General Comments General comments (skin integrity, edema, etc.): 6L O2      Pertinent Vitals/Pain Pain Assessment: No/denies pain  Home Living                      Prior Function            PT Goals (current goals can now be found in the care plan section) Acute Rehab PT Goals Patient Stated Goal: home  Progress towards PT goals: Progressing toward goals    Frequency    Min 3X/week      PT Plan Current plan remains appropriate    Co-evaluation PT/OT/SLP  Co-Evaluation/Treatment: Yes Reason for Co-Treatment: Complexity of the patient's impairments (multi-system involvement);For patient/therapist safety;To address functional/ADL transfers PT goals addressed during session: Mobility/safety with mobility;Balance OT goals addressed during session: ADL's and self-care;Strengthening/ROM      AM-PAC PT "6 Clicks" Mobility   Outcome Measure  Help needed turning from your back to your side while in a flat bed without using bedrails?: A Lot Help needed moving from lying on your back to sitting on the side of a flat bed without using bedrails?: A Lot Help needed moving to and from a bed to a chair (including a wheelchair)?: A Lot Help needed standing up from a chair using your arms (e.g., wheelchair or bedside chair)?: A Lot Help needed to walk in hospital room?: Total Help needed climbing 3-5 steps with a railing? : Total 6 Click Score: 10    End of Session Equipment Utilized During Treatment: Oxygen;Gait belt Activity Tolerance: Patient tolerated treatment well;Patient limited by fatigue Patient left: in chair;with call bell/phone within reach;with chair alarm set;with family/visitor present Nurse Communication: Mobility status PT Visit Diagnosis: Other abnormalities of gait and mobility (R26.89);Muscle weakness (generalized) (M62.81)     Time: 3710-6269 PT Time Calculation (min) (ACUTE ONLY): 25 min  Charges:  $Therapeutic Exercise: 8-22 mins                    Ramond Dial 12/23/2019, 3:57 PM   Mee Hives, PT MS Acute Rehab Dept. Number: [place] [facility] [place]

## 2019-12-23 NOTE — Progress Notes (Signed)
SLP Cancellation Note  Patient Details Name: Jeremy Parrish MRN: [id number] DOB: [birth year]   Cancelled treatment:       Reason Eval/Treat Not Completed: Other (comment). Discussed pt with Dr. Bonner Puna who feels there is some potential for improvement today. He requests SLP f/u in the next few days to determine if progress has been made in comparison with prior assessment last weekend. Will plan to f/u in 2-4 days for clinical reassessment. No need for instrumental testing at this time.    Jeremy Parrish, Katherene Ponto 12/23/2019, 10:45 AM

## 2019-12-23 NOTE — TOC Progression Note (Signed)
Transition of Care [facility]) - Progression Note    Patient Details  Name: Jeremy Parrish MRN: [id number] Date of Birth: 01-03-1941  Transition of Care [facility]) CM/SW Contact  Pollie Friar, RN Phone Number: 12/23/2019, 4:19 PM  Clinical Narrative:    CIR feels patient will benefit from [place] but currently not medically ready. MD updated.  CM has updated [facility] of change in plans. Will reconsult hospice if needed.  TOC following.   Expected Discharge Plan: Home w Hospice Care Barriers to Discharge: Continued Medical Work up  Expected Discharge Plan and Services Expected Discharge Plan: [place]   Discharge Planning Services: CM Consult   Living arrangements for the past 2 months: [place] Date [facility] Agency Contacted: 12/22/19 Time [place]: 1448 Representative spoke with at [place]: cassandra   Social Determinants of Health ([place]) Interventions    Readmission Risk Interventions No flowsheet data found.

## 2019-12-24 ENCOUNTER — Inpatient Hospital Stay (HOSPITAL_COMMUNITY): Payer: Medicare Other

## 2019-12-24 LAB — CBC
HCT: 32.1 % — ABNORMAL LOW (ref 39.0–52.0)
Hemoglobin: 10.1 g/dL — ABNORMAL LOW (ref 13.0–17.0)
MCH: 27.2 pg (ref 26.0–34.0)
MCHC: 31.5 g/dL (ref 30.0–36.0)
MCV: 86.5 fL (ref 80.0–100.0)
Platelets: 68 10*3/uL — ABNORMAL LOW (ref 150–400)
RBC: 3.71 MIL/uL — ABNORMAL LOW (ref 4.22–5.81)
RDW: 20.2 % — ABNORMAL HIGH (ref 11.5–15.5)
WBC: 5.4 10*3/uL (ref 4.0–10.5)
nRBC: 0 % (ref 0.0–0.2)

## 2019-12-24 LAB — BASIC METABOLIC PANEL
Anion gap: 10 (ref 5–15)
BUN: 63 mg/dL — ABNORMAL HIGH (ref 8–23)
CO2: 26 mmol/L (ref 22–32)
Calcium: 7.6 mg/dL — ABNORMAL LOW (ref 8.9–10.3)
Chloride: 106 mmol/L (ref 98–111)
Creatinine, Ser: 1.99 mg/dL — ABNORMAL HIGH (ref 0.61–1.24)
GFR calc Af Amer: 36 mL/min — ABNORMAL LOW (ref >60)
GFR calc non Af Amer: 31 mL/min — ABNORMAL LOW (ref >60)
Glucose, Bld: 193 mg/dL — ABNORMAL HIGH (ref 70–99)
Potassium: 3.8 mmol/L (ref 3.5–5.1)
Sodium: 142 mmol/L (ref 135–145)

## 2019-12-24 LAB — GLUCOSE, CAPILLARY
Glucose-Capillary: 109 mg/dL — ABNORMAL HIGH (ref 70–99)
Glucose-Capillary: 147 mg/dL — ABNORMAL HIGH (ref 70–99)
Glucose-Capillary: 94 mg/dL (ref 70–99)
Glucose-Capillary: 148 mg/dL — ABNORMAL HIGH (ref 70–99)

## 2019-12-24 MED ORDER — SACUBITRIL-VALSARTAN 24-26 MG PO TABS
1.0000 | ORAL_TABLET | Freq: Two times a day (BID) | ORAL | Status: DC
  Administered 2019-12-24 – 2019-12-27 (×6): 1 via ORAL
  Filled 2019-12-24 (×8): qty 1

## 2019-12-24 MED ORDER — FUROSEMIDE 20 MG PO TABS
20.0000 mg | ORAL_TABLET | Freq: Every day | ORAL | Status: DC
  Filled 2019-12-24: qty 1

## 2019-12-24 MED ORDER — OLANZAPINE 2.5 MG PO TABS
2.5000 mg | ORAL_TABLET | Freq: Every day | ORAL | Status: DC
  Administered 2019-12-24 – 2019-12-26 (×3): 2.5 mg via ORAL
  Filled 2019-12-24 (×4): qty 1

## 2019-12-24 MED ORDER — TRAZODONE HCL 50 MG PO TABS
50.0000 mg | ORAL_TABLET | Freq: Once | ORAL | Status: AC
  Administered 2019-12-24: 50 mg via ORAL
  Filled 2019-12-24: qty 1

## 2019-12-24 MED ORDER — HYOSCYAMINE SULFATE ER 0.375 MG PO TB12
0.3750 mg | ORAL_TABLET | Freq: Two times a day (BID) | ORAL | Status: DC | PRN
  Administered 2019-12-24: 0.375 mg via ORAL
  Filled 2019-12-24 (×2): qty 1

## 2019-12-24 MED ORDER — FUROSEMIDE 10 MG/ML IJ SOLN
40.0000 mg | Freq: Once | INTRAMUSCULAR | Status: AC
  Administered 2019-12-24: 40 mg via INTRAVENOUS
  Filled 2019-12-24: qty 4

## 2019-12-24 NOTE — Evaluation (Signed)
Clinical/Bedside Swallow Evaluation Patient Details  Name: Jeremy Parrish MRN: [id number] Date of Birth: 01-14-1941  Today's Date: 12/24/2019 Time: SLP Start Time (ACUTE ONLY): 0909 SLP Stop Time (ACUTE ONLY): 0934 SLP Time Calculation (min) (ACUTE ONLY): 25 min  Past Medical History:  Past Medical History:  Diagnosis Date  . Anemia   . Aortic aneurysm of unspecified site without mention of rupture   . Arthritis   . Bladder neck contracture   . Cancer ([place])   . Cerebral atherosclerosis    Carotid Doppler, 02/16/2013 - Bilateral Proximal ICAs,demonstrate mild plaque w/o evidence of significant diameter reduction, dissection, or any other vascular abnormality  . CHF (congestive heart failure) ([place])   . Complication of anesthesia   . COPD (chronic obstructive pulmonary disease) ([place])   . Coronary artery disease   . Depression   . Esophageal reflux   . Heart disease   . Heart murmur   . Hx of bladder cancer 10/07/2012  . Hyperlipidemia   . Hypertension   . 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 Intestinovesical fistula   . Kidney stones    history  . Lung mass   . Multiple myeloma   . Myocardial infarction [facility])    '96  . Non Hodgkin's lymphoma ([place])   . Paroxysmal atrial fibrillation ([place]) 01/02/2016  . Peripheral arterial disease ([place])   . Personal history of other diseases of circulatory system   . PONV (postoperative nausea and vomiting)   . Prostate cancer ([place]) 2000  . Shingles   . Shortness of breath   . Sleep apnea    05-02-14 cpap , not yet used- suggested settings 5  . Stroke [facility]) 2013   Speech.   Past Surgical History:  Past Surgical History:  Procedure Laterality Date  . BLADDER SURGERY    . BONE MARROW TRANSPLANT  2011  . CARDIOVERSION N/A 06/02/2019   Procedure: CARDIOVERSION;  Surgeon: Sanda Klein, MD;  Location: [facility] ENDOSCOPY;  Service: Cardiovascular;  Laterality: N/A;  . COLON  SURGERY     colon resection  . COLONOSCOPY N/A 01/01/2013   Procedure: COLONOSCOPY;  Surgeon: Rogene Houston, MD;  Location: [facility] ENDO SUITE;  Service: Endoscopy;  Laterality: N/A;  825-moved to [place] notified pt  . COLONOSCOPY N/A 07/16/2018   Procedure: COLONOSCOPY;  Surgeon: Rogene Houston, MD;  Location: [facility] ENDO SUITE;  Service: Endoscopy;  Laterality: N/A;  1:25  . CORONARY ANGIOPLASTY  06/24/2000   PCI and stenting in mid & proximal RCA  . heart stents x 5  1999  . INGUINAL HERNIA REPAIR Right 05/04/2014   Procedure: OPEN RIGHT INGUINAL HERNIA REPAIR with mesh;  Surgeon: Edward Jolly, MD;  Location: [facility] ORS;  Service: General;  Laterality: Right;  . INSERT / REPLACE / REMOVE PACEMAKER    . left ear skin cancer removed    . NM MYOCAR PERF WALL MOTION  11/27/2007   inferior scar  . PACEMAKER INSERTION  07/22/2011   [employer]  . POLYPECTOMY  07/16/2018   Procedure: POLYPECTOMY;  Surgeon: Rogene Houston, MD;  Location: [facility] ENDO [facility];  Service: Endoscopy;;  colon  . PORTACATH PLACEMENT  07/26/2009   right chest  . PROSTATE SURGERY    . Rotator    . ROTATOR CUFF REPAIR Right   . SHOULDER ARTHROSCOPY WITH SUBACROMIAL DECOMPRESSION Right 07/21/2013   Procedure: RIGHT SHOULDER ARTHROSCOPY WITH SUBACROMIAL DECOMPRESSION AND DEBRIDEMENT & Injection of Left Shoulder;  Surgeon: Alta Corning, MD;  Location: [place];  Service: Orthopedics;  Laterality: Right;  . TEE WITHOUT CARDIOVERSION  10/13/2012   Procedure: TRANSESOPHAGEAL ECHOCARDIOGRAM (TEE);  Surgeon: Sanda Klein, MD;  Location: [facility] ENDOSCOPY;  Service: Cardiovascular;  Laterality: N/A;  pat/kay/echo notified  . US ECHOCARDIOGRAPHY  06/19/2011   RV mildly dilated,mild to mod. MR,mild AI,mild PI  . WRIST SURGERY     right   HPI:  79 y/o male, with a PMH of aortic aneurysm, depression, bladder cancer, multiple myeloma, 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 Pt intubated  1/29-2/5. CRRT 2/4-2/5. ST working with pt, on D2, nectar, declined with plans to discharge with hospice, ST signed off then pt status improved somewhat and MD reordered ST.     Assessment / Plan / Recommendation Clinical Impression  This was this SLP's first time meeting pt. He was alert, conversive, relaying thoughts and needs with significant dysarthria. He independently and periodically increased volume to assist with intelligibility. RN reported he did not want to eat breakfast but drank nectar thick oj. With therapist, pt politely declined (Dys 2 texture) eggs, pototoes and grits but consumed 4 oz applesauce and 2 oz of additional container. No s/s aspiration with honey thick. Pt allowed to have ice after oral care and requesting water. Allowed and observed thin water sips with SLP only and given he may be discharging home with hospice services. Coughed on 70% trials which was to be expected. Discussed pt's desire for puree texture this am with applesauce and offered to modify his order for puree for potential ease of manipulation but he understandably wished to remain on Dys 2 (minced-ground). Feel that sips thin water in between meals after oral care with known risks if reasonable if desired. If pt discharging home with hospice he can choose to lift all restrictions to food/liquid if he/family prefers if he is on comfort care. If goal is to more aggressive treatment he potentially should remain on restrictions. ST will follow up briefly next week if here.    SLP Visit Diagnosis: Dysphagia, oropharyngeal phase (R13.12)    Aspiration Risk  Moderate aspiration risk    Diet Recommendation Dysphagia 2 (Fine chop);Nectar-thick liquid   Liquid Administration via: Cup Medication Administration: Crushed with puree Supervision: Staff to assist with self feeding;Full supervision/cueing for compensatory strategies Compensations: Minimize environmental distractions;Slow rate;Small sips/bites;Multiple dry  swallows after each bite/sip Postural Changes: Seated upright at 90 degrees    Other  Recommendations Oral Care Recommendations: Oral care QID;Oral care prior to ice chip/H20   Follow up Recommendations 24 hour supervision/assistance      Frequency and Duration min 2x/week  2 weeks       Prognosis Prognosis for Safe Diet Advancement: (fair-good) Barriers to Reach Goals: Cognitive deficits      Swallow Study   General HPI: 79 y/o male, with a PMH of aortic aneurysm, depression, bladder cancer, multiple myeloma, 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 Pt intubated 1/29-2/5. CRRT 2/4-2/5. ST working with pt, on D2, nectar, declined with plans to discharge with hospice, ST signed off then pt status improved somewhat and MD reordered ST.   Type of Study: Bedside Swallow Evaluation Previous Swallow Assessment: (see HPI) Diet Prior to this Study: Dysphagia 2 (chopped);Nectar-thick liquids Temperature Spikes Noted: No Respiratory Status: Nasal cannula History of Recent Intubation: Yes Length of Intubations (days): 8  days Date extubated: 12/17/19 Behavior/Cognition: Alert;Cooperative;Requires cueing;Distractible;Pleasant mood Oral Cavity Assessment: Within Functional Limits Oral Care Completed by SLP: Yes Oral Cavity - Dentition: Adequate natural dentition Vision: Functional for self-feeding Self-Feeding Abilities: Needs assist Patient Positioning: Upright in bed Baseline Vocal Quality: Low vocal intensity Volitional Cough: Weak Volitional Swallow: Able to elicit    Oral/Motor/Sensory Function Overall Oral Motor/Sensory Function: Mild impairment(uniltateral weakness)   Ice Chips Ice chips: Not tested   Thin Liquid Thin Liquid: Impaired Presentation: Cup Oral Phase Impairments: Reduced labial seal Oral Phase Functional Implications: Left anterior spillage;Right anterior spillage Pharyngeal  Phase Impairments: Cough -  Immediate    Nectar Thick Nectar Thick Liquid: Within functional limits   Honey Thick Honey Thick Liquid: Not tested   Puree Puree: Within functional limits   Solid     Solid: Not tested      [place] Siren 12/24/2019,10:04 AM    Orbie Pyo Colvin Caroli.Ed Risk analyst [phone] Office [phone]

## 2019-12-24 NOTE — Progress Notes (Signed)
Inpatient Rehabilitation-Admissions Coordinator   Met with pt multiple times at the bedside today. He states he is not having a good day. His appears to have increased RR and looks much more fatigued today compared to assessment yesterday. Spent time with the patient asking him about his preferences for rehab. Pt confirmed multiple times today that he wants to go home and does not want to pursue rehab. Wife in the room and respectful of pt's wishes. AC has communicated with TOC team regarding pt's preferences.   AC will sign off.   Raechel Ache, OTR/L  Rehab Admissions Coordinator  [phone] 12/24/2019 5:46 PM

## 2019-12-24 NOTE — Plan of Care (Signed)
  Problem: Activity: Goal: Ability to tolerate increased activity will improve Outcome: Progressing   Problem: Cardiac: Goal: Ability to achieve and maintain adequate cardiopulmonary perfusion will improve Outcome: Progressing   Problem: Clinical Measurements: Goal: Ability to maintain clinical measurements within normal limits will improve Outcome: Progressing Goal: Will remain free from infection Outcome: Progressing Goal: Diagnostic test results will improve Outcome: Progressing Goal: Respiratory complications will improve Outcome: Progressing Goal: Cardiovascular complication will be avoided Outcome: Progressing   Problem: Nutrition: Goal: Adequate nutrition will be maintained Outcome: Progressing   Problem: Education: Goal: Knowledge of disease or condition will improve Outcome: Not Progressing Goal: Understanding of medication regimen will improve Outcome: Not Progressing   Problem: Education: Goal: Knowledge of General Education information will improve Description: Including pain rating scale, medication(s)/side effects and non-pharmacologic comfort measures Outcome: Not Progressing

## 2019-12-24 NOTE — Plan of Care (Signed)
  Problem: Education: Goal: Knowledge of disease or condition will improve Outcome: Progressing Goal: Understanding of medication regimen will improve Outcome: Progressing Goal: Individualized Educational Video(s) Outcome: Progressing   Problem: Activity: Goal: Ability to tolerate increased activity will improve Outcome: Progressing   Problem: Cardiac: Goal: Ability to achieve and maintain adequate cardiopulmonary perfusion will improve Outcome: Progressing   Problem: Health Behavior/Discharge Planning: Goal: Ability to safely manage health-related needs after discharge will improve Outcome: Progressing   Problem: Education: Goal: Knowledge of General Education information will improve Description: Including pain rating scale, medication(s)/side effects and non-pharmacologic comfort measures Outcome: Progressing   Problem: Health Behavior/Discharge Planning: Goal: Ability to manage health-related needs will improve Outcome: Progressing   Problem: Clinical Measurements: Goal: Ability to maintain clinical measurements within normal limits will improve Outcome: Progressing Goal: Will remain free from infection Outcome: Progressing Goal: Diagnostic test results will improve Outcome: Progressing Goal: Respiratory complications will improve Outcome: Progressing Goal: Cardiovascular complication will be avoided Outcome: Progressing   Problem: Activity: Goal: Risk for activity intolerance will decrease Outcome: Progressing   Problem: Nutrition: Goal: Adequate nutrition will be maintained Outcome: Progressing   Problem: Coping: Goal: Level of anxiety will decrease Outcome: Progressing

## 2019-12-24 NOTE — Progress Notes (Signed)
PROGRESS NOTE  Jeremy Parrish  JSH:[id number] DOB: 01-Jan-1941 DOA: 12/09/2019 PCP: Celene Squibb, MD   Brief Narrative: Patient initially admitted for decompensated atrial fibrillation with rapid ventricular response, complicated by cardiogenic and septic shock (pneumonia). He had prolonged hospital stay, requiring invasive mechanical ventilation and renal replacement therapy.   79 year old male who presented to the hospital with worsening dyspnea. He does have significant past medical history for coronary artery disease, paroxysmal atrial fibrillation, sick sinus syndrome status post pacemaker, multiple myeloma, marginal zone lymphoma, COPD, hypertension and dyslipidemia. Patient was noted to have progressive dyspnea for 3 days, associated with agitation, bilateral extremity edema, and orthopnea. Patient symptoms were refractory to outpatient management with steroids. On his initial physical examination his heart rate was 122, temperature 97.6, blood pressure 134/87, oxygen saturation 99% on 2 L per nasal cannula. His lungs are clear to auscultation bilaterally, heart S1-S2, present, irregularly irregular, abdomen soft, 2+ bilateral lower extremity edema. Sodium 135, potassium 4.4, chloride 102, bicarb 22, glucose 128, BUN 36, creatinine 1.35.Chest radiograph with cardiomegaly and bibasilar atelectasis. EKG 121 bpm, left axis deviation, underlying rhythm atrial fibrillation with intermittent ventricular pacing no significant ST segment or T wave changes.  Patient was admitted to the hospital working diagnosis of atrial fibrillation with rapid ventricular response.  Patient developed cardiogenic shock, further work-up revealed newly depressed ejection fraction 30 to 35%.Patient was placed on vasopressors and mechanical ventilation.  Patient was further diagnosed with pneumonia due to Moraxella catarrhalis with sepsis (present on admission). Received broad-spectrum antibiotic  therapy.He suffered from acute tubular necrosis and underwent renal replacement therapy. Regained hemodynamic stability but remained on mechanical ventilation due to encephalopathy.  Patient was successfully extubated February 5.   Transferred to [facility] on 12/20/19. Patient continue to be very weak and deconditioned. Has actually continued to improve from the cardiovascular and renal perspectives, though remains severely deconditioned. Palliative care was consulted and discharge home with hospice was considered, as the patient is sure not to do well in a facility without his wife at bedside. CIR was recommended by PT/OT and is the currently planned disposition.  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   H/O cardiac pacemaker, Medtronic REVO, MRI conditional device, placed 07/2011 for sympyomatic bradycardia   Hx of bladder cancer   Hyperlipidemia with target LDL less than 100   Prediabetes   Obstructive sleep apnea   COPD (chronic obstructive pulmonary disease) (HCC)   Acute exacerbation of CHF (congestive heart failure) (HCC)   Acute respiratory failure with hypoxia (HCC)   Cardiogenic shock (HCC)   Shock ([place])   Pressure injury of skin   Encounter for central line placement   Acute renal failure superimposed on chronic kidney disease ([place])   Palliative care by specialist   Goals of care, counseling/discussion  Acute metabolic encephalopathy with hypoactive delirium: Waxing/waning, possibly due to improving renal function, presence of family, recovery from severe infection. - Dysphagia 2 diet.  - Delirium precautions reviewed with patient, wife, and Therapist, sports. Will try to get up to chair today again. - Continue as needed oxycodone for pain control.   Acute hypoxic  respiratory failure: Improved. PCCM signed off. - Patient reporting significant shortness of breath today. Will check CXR.  - Continue supplemental oxygen as needed to  maintain normal respiratory effort and SpO2 >90%.   CAD s/p PCI previously and demand ischemia: Troponin elevation not consistent with ACS. Not a candidiate for LHC with renal dysfunction.  - Continue current plan of care per cardiology.   AKI on CKD stage 3b, ATN due to shock, present on admission. Hyponatremia. Temporary HD catheter removed 02/07.  - Continue monitor renal function which continues to improve. UOP is also significantly better. ~2L in foley bag this AM accounting for seemingly less UOP currently charted. - Nephrology has signed off. Hemodialysis is not recommended long term with his clinical status.   Shock cardiogenic and septic (moraxella pneumonia present on admission). LV EF 30 to 35%, not candidate for advanced therapies. Resolved shock.  - Continue cefdinir.   Acute systolic heart failure in the setting of chronic atrial fibrillation with RVR/ NSVT.  - Tolerating well amiodarone.  - Continue holding anticoagulation due to elevated bleeding risk and thrombocytopenia.    Multiple myeloma:  - Treated at [facility]  T2DM: Well-controlled with Hgb A1c 5.6%.  - Glucose under reasonable control.   Hyperlipidemia:  - Continue statin  Stage 1 coccyx pressure ulcer, not present on admission. - Continue offloading as able, local wound care   Protein calorie malnutrition. Unspecified severity. Taking better po however. - Continue nutritional supplements.   DVT prophylaxis: SCDs Code Status: DNR Family Communication: Wife at bedside Disposition Plan: CIR has been recommended and is currently planned pending medical stability over next few days. If decompensating at all, would DC home with hospice.  Consultants:   Cardiology  Nephrology  PCCM  Palliative care team  Procedures:   Right IJ CVC  01/29  Subjective: Patient had hallucinations and disorientation severely last night, has no recollection, reports his voice is very weak, he feels very tired, short of breath moderately at rest, has back pain and wants to get up to chair. He did eat breakfast.   Objective: Vitals:   12/23/19 1539 12/23/19 2100 12/24/19 0500 12/24/19 0810  BP: 139/77 (!) 143/76  133/85  Pulse: 73 93  89  Resp: (!) 21   16  Temp: 99.1 F (37.3 C) 98.1 F (36.7 C)  98.9 F (37.2 C)  TempSrc:  Oral  Oral  SpO2: 99% 100%  100%  Weight:   74.8 kg   Height:        Intake/Output Summary (Last 24 hours) at 12/24/2019 1142 Last data filed at 12/24/2019 1100 Gross per 24 hour  Intake 2250 ml  Output 400 ml  Net 1850 ml   Filed Weights   12/19/19 0422 12/23/19 0520 12/24/19 0500  Weight: 79.6 kg 74.8 kg 74.8 kg   Gen: Frail chronically ill-appearing male in no distress Pulm: Nonlabored breathing supplemental oxygen, clear and diminished laterally CV: Irreg irreg without murmur, rub, or gallop. No JVD, trace dependent edema. GI: Abdomen soft, non-tender, non-distended, with normoactive bowel sounds.  Ext: Warm, no deformities Skin: No new rashes, lesions or ulcers on visualized skin. Neuro: Alert, weakly verbal, moves all extremities but is diffusely very weak  Psych: Mood euthymic & affect congruent. Attention intact. Behavior is appropriate.    Data Reviewed: I have personally reviewed following labs and imaging studies  CBC: Recent Labs  Lab 12/18/19 0406 12/19/19 0425 12/20/19 1302 12/23/19 0226 12/24/19 0103  WBC 8.7 9.7 8.5 5.4 5.4  HGB 12.6* 11.2* 11.7* 10.4* 10.1*  HCT 39.9 34.3* 35.1* 33.0* 32.1*  MCV 85.8 82.7 81.8 86.4 86.5  PLT 48* 48* 57* 66*  68*   Basic Metabolic Panel: Recent Labs  Lab 12/17/19 1617 12/17/19 1617 12/18/19 0406 12/18/19 0406 12/18/19 1522 12/18/19 1522 12/19/19 0425 12/19/19 0425 12/20/19 0256 12/21/19 0205 12/22/19 0245 12/23/19 0226  12/23/19 0858 12/24/19 0103  NA 143   < > 148*  147*   < > 143   < > 143   < > 143 142 144 146*  --  142  K 4.1   < > 4.5  4.5   < > 4.0   < > 3.5   < > 4.0 3.4* 3.1* 3.1*  --  3.8  CL 107   < > 111  108   < > 106   < > 109   < > 103 107 110 111  --  106  CO2 25   < > 24  24   < > 21*   < > 21*   < > 21* '22 22 25  '$ --  26  GLUCOSE 130*   < > 194*  201*   < > 284*   < > 118*   < > 130* 111* 183* 123*  --  193*  BUN 105*   < > 117*  121*   < > 131*   < > 121*   < > 125* 112* 93* 73*  --  63*  CREATININE 2.13*   < > 2.43*  2.52*   < > 2.76*   < > 2.48*   < > 2.73* 2.70* 2.65* 2.12*  --  1.99*  CALCIUM 7.8*   < > 7.8*  8.2*   < > 8.0*   < > 7.1*   < > 8.1* 7.9* 8.0* 7.7*  --  7.6*  MG  --   --  2.4  --   --   --  2.1  --  2.1  --   --   --  1.5*  --   PHOS 5.4*  --  6.9*  --  7.3*  --  6.2*  --   --   --   --   --   --   --    < > = values in this interval not displayed.   GFR: Estimated Creatinine Clearance: 29.1 mL/min (A) (by C-G formula based on SCr of 1.99 mg/dL (H)). Liver Function Tests: Recent Labs  Lab 12/17/19 1617 12/18/19 0406 12/18/19 1522 12/19/19 0425  ALBUMIN 1.9* 1.9* 1.8* 1.5*   Coagulation Profile: No results for input(s): INR, PROTIME in the last 168 hours. CBG: Recent Labs  Lab 12/23/19 0805 12/23/19 1232 12/23/19 1538 12/24/19 0807 12/24/19 1137  GLUCAP 95 180* 253* 109* 147*   Urine analysis:    Component Value Date/Time   COLORURINE YELLOW 12/16/2019 1845   APPEARANCEUR HAZY (A) 12/16/2019 1845   LABSPEC 1.014 12/16/2019 1845   PHURINE 5.0 12/16/2019 1845   GLUCOSEU 50 (A) 12/16/2019 1845   HGBUR LARGE (A) 12/16/2019 1845   BILIRUBINUR NEGATIVE 12/16/2019 [place] 12/16/2019 1845   PROTEINUR 30 (A) 12/16/2019 1845   NITRITE NEGATIVE 12/16/2019 1845   LEUKOCYTESUR NEGATIVE 12/16/2019 1845   Radiology Studies: No results found.  Scheduled Meds: . amiodarone  200 mg Oral BID  . atorvastatin  10 mg Oral q1800  .  B-complex with vitamin C  1 tablet Oral Daily  . chlorhexidine gluconate (MEDLINE KIT)  15 mL Mouth Rinse BID  . Chlorhexidine Gluconate Cloth  6 each Topical Daily  . dextrose  50 mL  Intravenous Once  . feeding supplement (ENSURE ENLIVE)  237 mL Oral TID BM  . OLANZapine  2.5 mg Oral QHS  . sodium chloride flush  3 mL Intravenous Q12H   Continuous Infusions: . sodium chloride Stopped (12/19/19 1112)     LOS: 15 days   Time spent: 35 minutes.   Patrecia Pour, MD [facility] [email or web address] 12/24/2019, 11:42 AM

## 2019-12-24 NOTE — TOC Progression Note (Signed)
Transition of Care [facility]) - Progression Note    Patient Details  Name: Jeremy Parrish MRN: [id number] Date of Birth: 04/12/1941  Transition of Care [facility]) CM/SW Contact  Pollie Friar, RN Phone Number: 12/24/2019, 4:18 PM  Clinical Narrative:    Claiborne Billings with CIR met with them a couple times today and the patient told her he wants to d/c home. CM spoke with pt and wife and they want to d/c home tomorrow with hospice through [facility]. MD updated.  CM will call [facility] and update them on new plan.  Pt will require PTAR home.    Expected Discharge Plan: Home w Hospice Care Barriers to Discharge: Continued Medical Work up  Expected Discharge Plan and Services Expected Discharge Plan: [place]   Discharge Planning Services: CM Consult   Living arrangements for the past 2 months: [place] Date [facility] Agency Contacted: 12/22/19 Time [place]: 1448 Representative spoke with at [place]: cassandra   Social Determinants of Health ([place]) Interventions    Readmission Risk Interventions No flowsheet data found.

## 2019-12-24 NOTE — Progress Notes (Addendum)
Advanced Heart Failure Team Rounding Note   Primary Physician: Celene Squibb, MD PCP-Cardiologist:  Sanda Klein, MD   Subjective:   Has not been on lasix in a few since 1/30  His wife says he is not having a good day. Has been short of breath today.   Past Medical History: Past Medical History:  Diagnosis Date  . Anemia   . Aortic aneurysm of unspecified site without mention of rupture   . Arthritis   . Bladder neck contracture   . Cancer ([place])   . Cerebral atherosclerosis    Carotid Doppler, 02/16/2013 - Bilateral Proximal ICAs,demonstrate mild plaque w/o evidence of significant diameter reduction, dissection, or any other vascular abnormality  . CHF (congestive heart failure) ([place])   . Complication of anesthesia   . COPD (chronic obstructive pulmonary disease) ([place])   . Coronary artery disease   . Depression   . Esophageal reflux   . Heart disease   . Heart murmur   . Hx of bladder cancer 10/07/2012  . Hyperlipidemia   . Hypertension   . 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 Intestinovesical fistula   . Kidney stones    history  . Lung mass   . Multiple myeloma   . Myocardial infarction [facility])    '96  . Non Hodgkin's lymphoma ([place])   . Paroxysmal atrial fibrillation ([place]) 01/02/2016  . Peripheral arterial disease ([place])   . Personal history of other diseases of circulatory system   . PONV (postoperative nausea and vomiting)   . Prostate cancer ([place]) 2000  . Shingles   . Shortness of breath   . Sleep apnea    05-02-14 cpap , not yet used- suggested settings 5  . Stroke [facility]) 2013   Speech.    Past Surgical History: Past Surgical History:  Procedure Laterality Date  . BLADDER SURGERY    . BONE MARROW TRANSPLANT  2011  . CARDIOVERSION N/A 06/02/2019   Procedure: CARDIOVERSION;  Surgeon: Sanda Klein, MD;  Location: [facility] ENDOSCOPY;  Service: Cardiovascular;  Laterality: N/A;  . COLON  SURGERY     colon resection  . COLONOSCOPY N/A 01/01/2013   Procedure: COLONOSCOPY;  Surgeon: Rogene Houston, MD;  Location: [facility] ENDO SUITE;  Service: Endoscopy;  Laterality: N/A;  825-moved to [place] notified pt  . COLONOSCOPY N/A 07/16/2018   Procedure: COLONOSCOPY;  Surgeon: Rogene Houston, MD;  Location: [facility] ENDO SUITE;  Service: Endoscopy;  Laterality: N/A;  1:25  . CORONARY ANGIOPLASTY  06/24/2000   PCI and stenting in mid & proximal RCA  . heart stents x 5  1999  . INGUINAL HERNIA REPAIR Right 05/04/2014   Procedure: OPEN RIGHT INGUINAL HERNIA REPAIR with mesh;  Surgeon: Edward Jolly, MD;  Location: [facility] ORS;  Service: General;  Laterality: Right;  . INSERT / REPLACE / REMOVE PACEMAKER    . left ear skin cancer removed    . NM MYOCAR PERF WALL MOTION  11/27/2007   inferior scar  . PACEMAKER INSERTION  07/22/2011   Medtronic  . POLYPECTOMY  07/16/2018   Procedure: POLYPECTOMY;  Surgeon: Rogene Houston, MD;  Location: [facility] ENDO SUITE;  Service: Endoscopy;;  colon  . PORTACATH PLACEMENT  07/26/2009   right chest  . PROSTATE SURGERY    . Rotator    . ROTATOR CUFF REPAIR Right   . SHOULDER ARTHROSCOPY WITH SUBACROMIAL DECOMPRESSION Right 07/21/2013   Procedure: RIGHT  SHOULDER ARTHROSCOPY WITH SUBACROMIAL DECOMPRESSION AND DEBRIDEMENT & Injection of Left Shoulder;  Surgeon: Alta Corning, MD;  Location: [place];  Service: Orthopedics;  Laterality: Right;  . TEE WITHOUT CARDIOVERSION  10/13/2012   Procedure: TRANSESOPHAGEAL ECHOCARDIOGRAM (TEE);  Surgeon: Sanda Klein, MD;  Location: [facility] ENDOSCOPY;  Service: Cardiovascular;  Laterality: N/A;  pat/kay/echo notified  . US ECHOCARDIOGRAPHY  06/19/2011   RV mildly dilated,mild to mod. MR,mild AI,mild PI  . WRIST SURGERY     right    Family History: Family History  Problem Relation Age of Onset  . Cancer Father        bladder  . Heart disease Father        before age 37  . Hypertension Mother   . Cancer Brother   . Heart disease Brother         before age 65  . Heart disease Sister        before age 46  . Hyperlipidemia Sister   . Hypertension Sister   . Heart attack Sister   . Colon cancer Neg Hx   . Colon polyps Neg Hx     Social History: Social History   Socioeconomic History  . Marital status: Married    Spouse name: [name]  . Number of children: 3  . Years of education: 9th  . Highest education level: Not on file  Occupational History  . Occupation: retired   Tobacco Use  . Smoking status: Former Smoker    Packs/day: 1.00    Years: 20.00    Pack years: 20.00    Types: Cigarettes    Quit date: 11/14/1994    Years since quitting: 25.1  . Smokeless tobacco: Never Used  Substance and Sexual Activity  . Alcohol use: No    Alcohol/week: 0.0 standard drinks    Comment: previously drank but none for at least 15 years.  . Drug use: No  . Sexual activity: Not on file  Other Topics Concern  . Not on file  Social History Narrative   Patient lives at home spouse.   Caffeine Use: Occasionally   Social Determinants of Health   Financial Resource Strain:   . Difficulty of Paying Living Expenses: Not on file  Food Insecurity:   . Worried About Charity fundraiser in the Last Year: Not on file  . Ran Out of Food in the Last Year: Not on file  Transportation Needs:   . Lack of Transportation (Medical): Not on file  . Lack of Transportation (Non-Medical): Not on file  Physical Activity:   . Days of Exercise per Week: Not on file  . Minutes of Exercise per Session: Not on file  Stress:   . Feeling of Stress : Not on file  Social Connections:   . Frequency of Communication with Friends and Family: Not on file  . Frequency of Social Gatherings with Friends and Family: Not on file  . Attends Religious Services: Not on file  . Active Member of Clubs or Organizations: Not on file  . Attends Archivist Meetings: Not on file  . Marital Status: Not on file    Allergies:  Allergies  Allergen  Reactions  . Morphine And Related Other (See Comments)    hallucinations  . Tape Rash    Paper tape is ok    Objective:    Vital Signs:   Temp:  [98.1 F (36.7 C)-99.1 F (37.3 C)] 98.9 F (37.2 C) (02/12 0810) Pulse Rate:  [  73-93] 89 (02/12 0810) Resp:  [16-21] 16 (02/12 0810) BP: (133-143)/(76-85) 133/85 (02/12 0810) SpO2:  [99 %-100 %] 100 % (02/12 0810) Weight:  [74.8 kg] 74.8 kg (02/12 0500) Last BM Date: 12/22/19  Weight change: Filed Weights   12/19/19 0422 12/23/19 0520 12/24/19 0500  Weight: 79.6 kg 74.8 kg 74.8 kg    Intake/Output:   Intake/Output Summary (Last 24 hours) at 12/24/2019 1207 Last data filed at 12/24/2019 1100 Gross per 24 hour  Intake 1950 ml  Output 400 ml  Net 1550 ml     Physical Exam   General:  Appears chronically. No resp difficulty HEENT: normal Neck: supple. no JVD. Carotids 2+ bilat; no bruits. No lymphadenopathy or thryomegaly appreciated. Cor: PMI nondisplaced. Regular rate & rhythm. No rubs, gallops or murmurs. Lungs: Rhonchi throughout  Abdomen: soft, nontender, nondistended. No hepatosplenomegaly. No bruits or masses. Good bowel sounds. Extremities: no cyanosis, clubbing, rash, edema Neuro: alert & orientedx3, cranial nerves grossly intact. moves all 4 extremities w/o difficulty. Affect pleasant  Labs   Basic Metabolic Panel: Recent Labs  Lab 12/17/19 1617 12/17/19 1617 12/18/19 0406 12/18/19 0406 12/18/19 1522 12/18/19 1522 12/19/19 0425 12/19/19 0425 12/20/19 0256 12/20/19 0256 12/21/19 0205 12/21/19 0205 12/22/19 0245 12/23/19 0226 12/23/19 0858 12/24/19 0103  NA 143   < > 148*  147*   < > 143   < > 143   < > 143  --  142  --  144 146*  --  142  K 4.1   < > 4.5  4.5   < > 4.0   < > 3.5   < > 4.0  --  3.4*  --  3.1* 3.1*  --  3.8  CL 107   < > 111  108   < > 106   < > 109   < > 103  --  107  --  110 111  --  106  CO2 25   < > 24  24   < > 21*   < > 21*   < > 21*  --  22  --  22 25  --  26  GLUCOSE 130*    < > 194*  201*   < > 284*   < > 118*   < > 130*  --  111*  --  183* 123*  --  193*  BUN 105*   < > 117*  121*   < > 131*   < > 121*   < > 125*  --  112*  --  93* 73*  --  63*  CREATININE 2.13*   < > 2.43*  2.52*   < > 2.76*   < > 2.48*   < > 2.73*  --  2.70*  --  2.65* 2.12*  --  1.99*  CALCIUM 7.8*   < > 7.8*  8.2*   < > 8.0*   < > 7.1*   < > 8.1*   < > 7.9*   < > 8.0* 7.7*  --  7.6*  MG  --   --  2.4  --   --   --  2.1  --  2.1  --   --   --   --   --  1.5*  --   PHOS 5.4*  --  6.9*  --  7.3*  --  6.2*  --   --   --   --   --   --   --   --   --    < > =  values in this interval not displayed.    Liver Function Tests: Recent Labs  Lab 12/17/19 1617 12/18/19 0406 12/18/19 1522 12/19/19 0425  ALBUMIN 1.9* 1.9* 1.8* 1.5*   No results for input(s): LIPASE, AMYLASE in the last 168 hours. No results for input(s): AMMONIA in the last 168 hours.  CBC: Recent Labs  Lab 12/18/19 0406 12/19/19 0425 12/20/19 1302 12/23/19 0226 12/24/19 0103  WBC 8.7 9.7 8.5 5.4 5.4  HGB 12.6* 11.2* 11.7* 10.4* 10.1*  HCT 39.9 34.3* 35.1* 33.0* 32.1*  MCV 85.8 82.7 81.8 86.4 86.5  PLT 48* 48* 57* 66* 68*    Cardiac Enzymes: No results for input(s): CKTOTAL, CKMB, CKMBINDEX, TROPONINI in the last 168 hours.  BNP: BNP (last 3 results) Recent Labs    12/09/19 0130  BNP 2,803.0*    ProBNP (last 3 results) No results for input(s): PROBNP in the last 8760 hours.   CBG: Recent Labs  Lab 12/23/19 0805 12/23/19 1232 12/23/19 1538 12/24/19 0807 12/24/19 1137  GLUCAP 95 180* 253* 109* 147*    Coagulation Studies: No results for input(s): LABPROT, INR in the last 72 hours.   Imaging   No results found.   Medications:     Current Medications: . amiodarone  200 mg Oral BID  . atorvastatin  10 mg Oral q1800  . B-complex with vitamin C  1 tablet Oral Daily  . chlorhexidine gluconate (MEDLINE KIT)  15 mL Mouth Rinse BID  . Chlorhexidine Gluconate Cloth  6 each Topical Daily  .  dextrose  50 mL Intravenous Once  . feeding supplement (ENSURE ENLIVE)  237 mL Oral TID BM  . OLANZapine  2.5 mg Oral QHS  . sodium chloride flush  3 mL Intravenous Q12H    Infusions: . sodium chloride Stopped (12/19/19 1112)    Assessment/Plan   1. Cardiogenic shock/ Septic Shock - EF 30-35% by echo (newly down) - Unclear etiology ? AF vs ischemia.   - Blood CX 1/29 - No growth  - Suspect major issue was PNA but did have several low co-ox suggestive of component of cardiogenic shock   2. Acute systolic HF - ECHO 92-01%. (previosuly 45%) - EF likely worse in setting of AF/shock - Not candidate for cath with AKI  - HF therapy limited by AKI.  - SOB today. He does not look volume overloaded.  - Could give some po lasix as needed.   3. Acute hypoxic respiratory failure - Extubated 2/5, much improved - CCM has signed off  4. CAD - s/p previous PCIs - hs trop 30 -> 43 -> 43. Not c/w ACS. No change   5. AKI - Due to ATN from shock - Creatinine stabilized at 2/7 - Nephrology signed off.   6. Chronic A fib  - continue PO amiodarone 200 bid  - Rate controlled.  - a/c currently on hold  - Platelets 68   7. Multiple myeloma - treated at [facility]  8. Dementia - unclear how severe this is  9. DNR/DNI   Per Primary Team. Possible CIR versus home with Hospice. Not sure he could tolerate 3 hours of therapy. Does not seem to be making much progress.    Length of Stay: [place], NP  12/24/2019, 12:07 PM  Advanced Heart Failure Team Pager [phone] (M-F; 7a - 4p)  Please contact [facility] for night-coverage after hours (4p -7a ) and weekends on [email or web address]   Patient seen and examined with the above-signed Advanced Practice Provider  and/or Housestaff. I personally reviewed laboratory data, imaging studies and relevant notes. I independently examined the patient and formulated the important aspects of the plan. I have edited the note to reflect any of my changes or  salient points. I have personally discussed the plan with the patient and/or family.  He remains weak. More SOB today. Appears volume overloaded. Will give a dose of lasix 40 IV and start lasix 20 daily. Will start low-dose Entresto as BP tolerates.   I agree that he will likely not qualify for CIR. Home with Hospice likely best option.   Glori Bickers, MD  3:45 PM

## 2019-12-24 NOTE — Progress Notes (Signed)
Patient ID: Jeremy Parrish, male   DOB: Mar 06, 1941, 79 y.o.   MRN: [id number] [facility] Progress Note   Assessment/ Plan:   1. Acute kidney Injury on chronic kidney disease stage III (baseline creatinine 1.3-1.5): Appears to have suffered multifactorial acute kidney injury with cardiogenic shock/nephrotoxin exposure and transiently on CRRT for uremia between 2/4-2/5.  Creatinine gradually trending down with decreased urine output charted (unclear if truly low or charting error).  Given his inconsistent oral intake and current fluid status, do not feel that scheduled diuretic use indicated at this point.  Given his overall picture, would not recommend chronic hemodialysis as it would likely hasten his decline. 2.  Mixed septic/cardiogenic shock: Improving status post antibiotic therapy with transient pressor requirements.  Appreciate discussions between palliative care and family to decide on next steps including home with hospice. 3.  Moraxella pneumonia: Previous antibiotic therapy spectrum now narrowed down to oral cefdinir.  He remains afebrile and hemodynamically stable. 4.  Acute systolic heart failure: Appears to be fairly compensated with trace ankle edema.  Renal service will sign off at this time with trajectory appearing to be towards comfort/palliative care.  Please call with questions or concerns.  Subjective:   Noted to have had hallucinations overnight with disorientation/communication difficulties.   Objective:   BP (!) 143/76   Pulse 93   Temp 98.1 F (36.7 C) (Oral)   Resp (!) 21   Ht _0  (1.702 m)   Wt 74.8 kg   SpO2 100%   BMI 25.83 kg/m   Intake/Output Summary (Last 24 hours) at 12/24/2019 0742 Last data filed at 12/23/2019 1827 Gross per 24 hour  Intake 500 ml  Output 400 ml  Net 100 ml   Weight change: 0 kg  Physical Exam: Gen: Appears to be comfortable resting in bed, intermittently whispering CVS: Pulse irregularly irregular, S1 and S2  without murmur Resp: Coarse/transmitted breath sounds bilaterally without rales or rhonchi. Abd: Soft, flat, nontender Ext: Trace ankle extremity edema.  Imaging: No results found.  Labs: BMET Recent Labs  Lab 12/17/19 1617 12/17/19 1617 12/18/19 0406 12/18/19 0406 12/18/19 1522 12/19/19 0425 12/20/19 0256 12/21/19 0205 12/22/19 0245 12/23/19 0226 12/24/19 0103  NA 143   < > 148*  147*   < > 143 143 143 142 144 146* 142  K 4.1   < > 4.5  4.5   < > 4.0 3.5 4.0 3.4* 3.1* 3.1* 3.8  CL 107   < > 111  108   < > 106 109 103 107 110 111 106  CO2 25   < > 24  24   < > 21* 21* 21* _1 GLUCOSE 130*   < > 194*  201*   < > 284* 118* 130* 111* 183* 123* 193*  BUN 105*   < > 117*  121*   < > 131* 121* 125* 112* 93* 73* 63*  CREATININE 2.13*   < > 2.43*  2.52*   < > 2.76* 2.48* 2.73* 2.70* 2.65* 2.12* 1.99*  CALCIUM 7.8*   < > 7.8*  8.2*   < > 8.0* 7.1* 8.1* 7.9* 8.0* 7.7* 7.6*  PHOS 5.4*  --  6.9*  --  7.3* 6.2*  --   --   --   --   --    < > = values in this interval not displayed.   CBC Recent Labs  Lab 12/19/19 0425 12/20/19 1302 12/23/19 0226 12/24/19 0103  WBC 9.7 8.5 5.4 5.4  HGB 11.2* 11.7* 10.4* 10.1*  HCT 34.3* 35.1* 33.0* 32.1*  MCV 82.7 81.8 86.4 86.5  PLT 48* 57* 66* 68*    Medications:    . amiodarone  200 mg Oral BID  . atorvastatin  10 mg Oral q1800  . B-complex with vitamin C  1 tablet Oral Daily  . cefdinir  300 mg Oral Q12H  . chlorhexidine gluconate (MEDLINE KIT)  15 mL Mouth Rinse BID  . Chlorhexidine Gluconate Cloth  6 each Topical Daily  . dextrose  50 mL Intravenous Once  . feeding supplement (ENSURE ENLIVE)  237 mL Oral TID BM  . potassium chloride  20 mEq Oral BID  . sodium chloride flush  3 mL Intravenous Q12H   Elmarie Shiley, MD 12/24/2019, 7:42 AM

## 2019-12-25 LAB — BASIC METABOLIC PANEL
Anion gap: 9 (ref 5–15)
BUN: 52 mg/dL — ABNORMAL HIGH (ref 8–23)
CO2: 25 mmol/L (ref 22–32)
Calcium: 7.7 mg/dL — ABNORMAL LOW (ref 8.9–10.3)
Chloride: 110 mmol/L (ref 98–111)
Creatinine, Ser: 1.85 mg/dL — ABNORMAL HIGH (ref 0.61–1.24)
GFR calc Af Amer: 40 mL/min — ABNORMAL LOW (ref >60)
GFR calc non Af Amer: 34 mL/min — ABNORMAL LOW (ref >60)
Glucose, Bld: 121 mg/dL — ABNORMAL HIGH (ref 70–99)
Potassium: 3.1 mmol/L — ABNORMAL LOW (ref 3.5–5.1)
Sodium: 144 mmol/L (ref 135–145)

## 2019-12-25 LAB — CBC
HCT: 34.8 % — ABNORMAL LOW (ref 39.0–52.0)
Hemoglobin: 10.7 g/dL — ABNORMAL LOW (ref 13.0–17.0)
MCH: 27.2 pg (ref 26.0–34.0)
MCHC: 30.7 g/dL (ref 30.0–36.0)
MCV: 88.3 fL (ref 80.0–100.0)
Platelets: 64 10*3/uL — ABNORMAL LOW (ref 150–400)
RBC: 3.94 MIL/uL — ABNORMAL LOW (ref 4.22–5.81)
RDW: 20.4 % — ABNORMAL HIGH (ref 11.5–15.5)
WBC: 5.5 10*3/uL (ref 4.0–10.5)
nRBC: 0 % (ref 0.0–0.2)

## 2019-12-25 LAB — GLUCOSE, CAPILLARY
Glucose-Capillary: 94 mg/dL (ref 70–99)
Glucose-Capillary: 96 mg/dL (ref 70–99)
Glucose-Capillary: 100 mg/dL — ABNORMAL HIGH (ref 70–99)

## 2019-12-25 MED ORDER — POTASSIUM CHLORIDE CRYS ER 20 MEQ PO TBCR
20.0000 meq | EXTENDED_RELEASE_TABLET | Freq: Two times a day (BID) | ORAL | Status: AC
  Administered 2019-12-25 – 2019-12-26 (×3): 20 meq via ORAL
  Filled 2019-12-25 (×3): qty 1

## 2019-12-25 NOTE — Progress Notes (Signed)
PROGRESS NOTE  Jeremy Parrish  DJM:[id number] DOB: [birth year] DOA: 12/09/2019 PCP: Celene Squibb, MD   Brief Narrative: Patient initially admitted for decompensated atrial fibrillation with rapid ventricular response, complicated by cardiogenic and septic shock (pneumonia). He had prolonged hospital stay, requiring invasive mechanical ventilation and renal replacement therapy.   79 year old male who presented to the hospital with worsening dyspnea. He does have significant past medical history for coronary artery disease, paroxysmal atrial fibrillation, sick sinus syndrome status post pacemaker, multiple myeloma, marginal zone lymphoma, COPD, hypertension and dyslipidemia. Patient was noted to have progressive dyspnea for 3 days, associated with agitation, bilateral extremity edema, and orthopnea. Patient symptoms were refractory to outpatient management with steroids. On his initial physical examination his heart rate was 122, temperature 97.6, blood pressure 134/87, oxygen saturation 99% on 2 L per nasal cannula. His lungs are clear to auscultation bilaterally, heart S1-S2, present, irregularly irregular, abdomen soft, 2+ bilateral lower extremity edema. Sodium 135, potassium 4.4, chloride 102, bicarb 22, glucose 128, BUN 36, creatinine 1.35.Chest radiograph with cardiomegaly and bibasilar atelectasis. EKG 121 bpm, left axis deviation, underlying rhythm atrial fibrillation with intermittent ventricular pacing no significant ST segment or T wave changes.  Patient was admitted to the hospital working diagnosis of atrial fibrillation with rapid ventricular response.  Patient developed cardiogenic shock, further work-up revealed newly depressed ejection fraction 30 to 35%.Patient was placed on vasopressors and mechanical ventilation.  Patient was further diagnosed with pneumonia due to Moraxella catarrhalis with sepsis (present on admission). Received broad-spectrum antibiotic  therapy.He suffered from acute tubular necrosis and underwent renal replacement therapy. Regained hemodynamic stability but remained on mechanical ventilation due to encephalopathy.  Patient was successfully extubated February 5.   Transferred to [facility] on 12/20/19. Patient continue to be very weak and deconditioned. Has actually continued to improve from the cardiovascular and renal perspectives, though remains severely deconditioned. Palliative care was consulted and discharge home with hospice was considered, as the patient is sure not to do well in a facility without his wife at bedside. CIR was recommended by PT/OT and is the currently planned disposition.  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   H/O cardiac pacemaker, [employer] REVO, MRI conditional device, placed 07/2011 for sympyomatic bradycardia   Hx of bladder cancer   Hyperlipidemia with target LDL less than 100   Prediabetes   Obstructive sleep apnea   COPD (chronic obstructive pulmonary disease) (HCC)   Acute exacerbation of CHF (congestive heart failure) ([place])   Acute respiratory failure with hypoxia (HCC)   Cardiogenic shock (HCC)   Shock ([place])   Pressure injury of skin   Encounter for central line placement    Acute renal failure superimposed on chronic kidney disease ([place])   Palliative care by specialist   Goals of care, counseling/discussion  Acute metabolic encephalopathy with hypoactive delirium: Waxing/waning, possibly due to improving renal function, presence of family, recovery from severe infection. - Dysphagia 2 diet.  - Delirium precautions reviewed with wife, RN already- Continue as needed oxycodone for pain control.   Acute hypoxic respiratory failure: Improved. PCCM signed off. - Patient reporting significant shortness of breath today. Will check CXR.  - Continue supplemental oxygen as needed to maintain normal respiratory effort and SpO2 >90%.   CAD s/p PCI previously and demand ischemia: Troponin elevation not consistent with ACS. Not a candidiate for LHC with renal dysfunction.  - Continue current plan of care per cardiology.   AKI on CKD stage 3b, ATN due to shock, present on admission. Hyponatremia. Temporary HD catheter removed 02/07.  - Continue monitor renal function which continues to improve. UOP is also significantly better. ~2L in foley bag this AM accounting for seemingly less UOP currently charted. - Nephrology has signed off. Hemodialysis is not recommended long term with his clinical status.   Shock cardiogenic and septic (moraxella pneumonia present on admission). LV EF 30 to 35%, not candidate for advanced therapies. Resolved shock.  - Continue cefdinir.   Acute systolic heart failure in the setting of chronic atrial fibrillation with RVR/ NSVT.  - Tolerating well amiodarone.  - Continue holding anticoagulation due to elevated bleeding risk and thrombocytopenia.    Multiple myeloma:  - Treated at [facility]  T2DM: Well-controlled with Hgb A1c 5.6%.  - Glucose under reasonable control.   Hyperlipidemia:  - Continue statin  Stage 1 coccyx pressure ulcer, not present on admission. - Continue offloading as able, local wound care   Protein calorie  malnutrition. Unspecified severity. Taking better po however. - Continue nutritional supplements.   DVT prophylaxis: SCDs Code Status: DNR Family Communication: Wife at bedside Disposition Plan: CIR has been recommended and is currently planned pending medical stability over next few days. If decompensating at all, would DC home with hospice.  Consultants:   Cardiology  Nephrology  PCCM  Palliative care team  Procedures:   Right IJ CVC 01/29  Subjective: Resting comfortably, calm   objective: Vitals:   12/24/19 0810 12/24/19 1609 12/24/19 1907 12/25/19 0900  BP: 133/85 (!) 142/77 124/74 112/66  Pulse: 89 62 88 73  Resp: 16 16    Temp: 98.9 F (37.2 C) 98.1 F (36.7 C) 98.6 F (37 C) 98.2 F (36.8 C)  TempSrc: Oral  Oral Oral  SpO2: 100% 92% 100% 91%  Weight:      Height:        Intake/Output Summary (Last 24 hours) at 12/25/2019 1449 Last data filed at 12/24/2019 1900 Gross per 24 hour  Intake --  Output 200 ml  Net -200 ml   Filed Weights   12/19/19 0422 12/23/19 0520 12/24/19 0500  Weight: 79.6 kg 74.8 kg 74.8 kg   Gen: chronically ill-appearing,NAD Pulm: Nonlabored breathing supplemental oxygen, clear and diminished laterally CV: Irreg irreg without murmur, rub, or gallop. No JVD, trace dependent edema. GI: Abdomen soft, non-tender, non-distended, with normoactive bowel sounds.  Ext: Warm, no deformities Skin: No new rashes, lesions or ulcers on visualized skin. Neuro: Alert, weakly verbal, moves all extremities but is diffusely very weak  Psych: Mood euthymic & affect congruent. Attention intact. Behavior is appropriate.    Data Reviewed: I have personally reviewed following labs and imaging studies  CBC: Recent Labs  Lab 12/19/19 0425 12/20/19 1302 12/23/19 0226 12/24/19 0103 12/25/19 0320  WBC 9.7 8.5 5.4 5.4 5.5  HGB 11.2* 11.7* 10.4* 10.1* 10.7*  HCT 34.3* 35.1* 33.0* 32.1* 34.8*  MCV 82.7 81.8 86.4 86.5 88.3  PLT 48* 57* 66* 68* 64*    Basic Metabolic Panel: Recent Labs  Lab 12/18/19 1522 12/18/19 1522 12/19/19 0425 12/19/19 0425 12/20/19 0256 12/20/19 0256 12/21/19 0205 12/22/19 0245 12/23/19 0226 12/23/19 0858 12/24/19 0103 12/25/19 0320  NA 143   < > 143   < > 143   < > 142 144 146*  --  142 144  K 4.0   < > 3.5   < > 4.0   < > 3.4* 3.1* 3.1*  --  3.8 3.1*  CL 106   < > 109   < > 103   < > 107 110 111  --  106 110  CO2 21*   < > 21*   < > 21*   < > _0 --  26 25  GLUCOSE 284*   < > 118*   < > 130*   < > 111* 183* 123*  --  193* 121*  BUN 131*   < > 121*   < > 125*   < > 112* 93* 73*  --  63* 52*  CREATININE 2.76*   < > 2.48*   < > 2.73*   < > 2.70* 2.65* 2.12*  --  1.99* 1.85*  CALCIUM 8.0*   < > 7.1*   < > 8.1*   < > 7.9* 8.0* 7.7*  --  7.6* 7.7*  MG  --   --  2.1  --  2.1  --   --   --   --  1.5*  --   --   PHOS 7.3*  --  6.2*  --   --   --   --   --   --   --   --   --    < > = values in this interval not displayed.   GFR: Estimated Creatinine Clearance: 31.3 mL/min (A) (by C-G formula based on SCr of 1.85 mg/dL (H)). Liver Function Tests: Recent Labs  Lab 12/18/19 1522 12/19/19 0425  ALBUMIN 1.8* 1.5*   Coagulation Profile: No results for input(s): INR, PROTIME in the last 168 hours. CBG: Recent Labs  Lab 12/24/19 0807 12/24/19 1137 12/24/19 1611 12/24/19 1941 12/25/19 0753  GLUCAP 109* 147* 94 148* 94   Urine analysis:    Component Value Date/Time   COLORURINE YELLOW 12/16/2019 1845   APPEARANCEUR HAZY (A) 12/16/2019 1845   LABSPEC 1.014 12/16/2019 1845   PHURINE 5.0 12/16/2019 1845   GLUCOSEU 50 (A) 12/16/2019 1845   HGBUR LARGE (A) 12/16/2019 1845   BILIRUBINUR NEGATIVE 12/16/2019 [place] 12/16/2019 1845   PROTEINUR 30 (A) 12/16/2019 1845   NITRITE NEGATIVE 12/16/2019 1845   LEUKOCYTESUR NEGATIVE 12/16/2019 1845   Radiology Studies: DG CHEST PORT 1 VIEW  Result Date: 12/24/2019 CLINICAL DATA:  Acute respiratory failure, COPD EXAM: PORTABLE CHEST  1 VIEW COMPARISON:  12/12/2019 FINDINGS: Right-sided Port-A-Cath with the tip in satisfactory position. Dual lead cardiac pacemaker. No focal consolidation, pleural effusion or pneumothorax. Stable cardiomediastinal silhouette. No aggressive osseous lesion. 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 IMPRESSION: No acute cardiopulmonary disease. Electronically Signed   By: Kathreen Devoid   On: 12/24/2019 11:44    Scheduled Meds: . amiodarone  200 mg Oral BID  . atorvastatin  10 mg Oral q1800  . B-complex with vitamin C  1 tablet Oral  Daily  . chlorhexidine gluconate (MEDLINE KIT)  15 mL Mouth Rinse BID  . Chlorhexidine Gluconate Cloth  6 each Topical Daily  . dextrose  50 mL Intravenous Once  . feeding supplement (ENSURE ENLIVE)  237 mL Oral TID BM  . OLANZapine  2.5 mg Oral QHS  . potassium chloride  20 mEq Oral BID  . sacubitril-valsartan  1 tablet Oral BID  . sodium chloride flush  3 mL Intravenous Q12H   Continuous Infusions: . sodium chloride Stopped (12/19/19 1112)     LOS: 16 days   Time spent: 25 minutes.   Benito Mccreedy, MD [facility] [email or web address] 12/25/2019, 2:49 PM

## 2019-12-25 NOTE — Plan of Care (Signed)

## 2019-12-25 NOTE — Progress Notes (Signed)
Progress Note  Patient Name: Jeremy Parrish Date of Encounter: 12/25/2019  Primary Cardiologist: Sanda Klein, MD   Subjective   Alert, weak. Aphasic (chronic). Hard to understand. Nods "yes" to "thirsty?", shakes head "no" to "hungry?". Eagerly drinks Ensure per RN. Breathing comfortably fully flat in bed. Frequent BMs. Preparations for home hospice have been made. Home O2 and bed delivered. Documented UO is very low, but BUN and creatinine have improved. K 3.1.  Inpatient Medications    Scheduled Meds: . amiodarone  200 mg Oral BID  . atorvastatin  10 mg Oral q1800  . B-complex with vitamin C  1 tablet Oral Daily  . chlorhexidine gluconate (MEDLINE KIT)  15 mL Mouth Rinse BID  . Chlorhexidine Gluconate Cloth  6 each Topical Daily  . dextrose  50 mL Intravenous Once  . feeding supplement (ENSURE ENLIVE)  237 mL Oral TID BM  . OLANZapine  2.5 mg Oral QHS  . sacubitril-valsartan  1 tablet Oral BID  . sodium chloride flush  3 mL Intravenous Q12H   Continuous Infusions: . sodium chloride Stopped (12/19/19 1112)   PRN Meds: sodium chloride, acetaminophen **OR** acetaminophen, albuterol, artificial tears, bisacodyl, Gerhardt's butt cream, hyoscyamine, loperamide, [DISCONTINUED] ondansetron **OR** ondansetron (ZOFRAN) IV, oxyCODONE, Resource ThickenUp Clear, sodium chloride flush, sodium chloride flush   Vital Signs    Vitals:   12/24/19 0500 12/24/19 0810 12/24/19 1609 12/24/19 1907  BP:  133/85 (!) 142/77 124/74  Pulse:  89 62 88  Resp:  16 16   Temp:  98.9 F (37.2 C) 98.1 F (36.7 C) 98.6 F (37 C)  TempSrc:  Oral  Oral  SpO2:  100% 92% 100%  Weight: 74.8 kg     Height:        Intake/Output Summary (Last 24 hours) at 12/25/2019 0925 Last data filed at 12/24/2019 1900 Gross per 24 hour  Intake 1950 ml  Output 200 ml  Net 1750 ml   Last 3 Weights 12/24/2019 12/23/2019 12/19/2019  Weight (lbs) 164 lb 14.5 oz 164 lb 14.5 oz 175 lb 7.8 oz  Weight (kg) 74.8 kg  74.8 kg 79.6 kg      Telemetry    AF, rate controlled - Personally Reviewed  ECG    No new ECG   Physical Exam  Weak, alert and follows commands. Dry mucous membranes and skin tenting. GEN: No acute distress.   Neck: No JVD Cardiac: irregular, no murmurs, rubs, or gallops.  Respiratory: Clear to auscultation bilaterally. GI: Soft, nontender, non-distended  MS: No edema; No deformity. Neuro:  expressive aphasia Psych: Normal affect   Labs    High Sensitivity Troponin:   Recent Labs  Lab 12/09/19 0130 12/09/19 0355 12/09/19 0930 12/09/19 1321  TROPONINIHS 37* 30* 43* 43*      Chemistry Recent Labs  Lab 12/18/19 1522 12/18/19 1522 12/19/19 0425 12/20/19 0256 12/23/19 0226 12/24/19 0103 12/25/19 0320  NA 143   < > 143   < > 146* 142 144  K 4.0   < > 3.5   < > 3.1* 3.8 3.1*  CL 106   < > 109   < > 111 106 110  CO2 21*   < > 21*   < > '25 26 25  '$ GLUCOSE 284*   < > 118*   < > 123* 193* 121*  BUN 131*   < > 121*   < > 73* 63* 52*  CREATININE 2.76*   < > 2.48*   < >  2.12* 1.99* 1.85*  CALCIUM 8.0*   < > 7.1*   < > 7.7* 7.6* 7.7*  ALBUMIN 1.8*  --  1.5*  --   --   --   --   GFRNONAA 21*   < > 24*   < > 29* 31* 34*  GFRAA 25*   < > 28*   < > 34* 36* 40*  ANIONGAP 16*   < > 13   < > '10 10 9   '$ < > = values in this interval not displayed.     Hematology Recent Labs  Lab 12/23/19 0226 12/24/19 0103 12/25/19 0320  WBC 5.4 5.4 5.5  RBC 3.82* 3.71* 3.94*  HGB 10.4* 10.1* 10.7*  HCT 33.0* 32.1* 34.8*  MCV 86.4 86.5 88.3  MCH 27.2 27.2 27.2  MCHC 31.5 31.5 30.7  RDW 20.5* 20.2* 20.4*  PLT 66* 68* 64*    BNPNo results for input(s): BNP, PROBNP in the last 168 hours.   DDimer No results for input(s): DDIMER in the last 168 hours.   Radiology    DG CHEST PORT 1 VIEW  Result Date: 12/24/2019 CLINICAL DATA:  Acute respiratory failure, COPD EXAM: PORTABLE CHEST 1 VIEW COMPARISON:  12/12/2019 FINDINGS: Right-sided Port-A-Cath with the tip in satisfactory  position. Dual lead cardiac pacemaker. No focal consolidation, pleural effusion or pneumothorax. Stable cardiomediastinal silhouette. No aggressive osseous lesion. 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 IMPRESSION: No acute cardiopulmonary disease. Electronically Signed   By: Kathreen Devoid   On: 12/24/2019 11:44    Cardiac Studies   ECHO 12/09/2019 1. Left ventricular ejection fraction, by visual estimation, is 30 to  35%. The left ventricle has severely decreased function. There is mildly  increased left ventricular hypertrophy.  2. Left ventricular diastolic function could not be evaluated.  3. The left ventricle demonstrates global hypokinesis.  4. Global right ventricle has mildly reduced systolic function.The right  ventricular size is moderately enlarged. No increase in right ventricular  wall thickness.  5. Left atrial size was severely dilated.  6. Right atrial size was moderately dilated.  7. The mitral valve is grossly normal. Moderate mitral valve  regurgitation.  8. The tricuspid valve is grossly normal.  9. The tricuspid valve is grossly normal. Tricuspid valve regurgitation  is moderate.  10. The aortic valve is tricuspid. Aortic valve regurgitation is mild. No  evidence of aortic valve sclerosis or stenosis.  11. Pulmonic regurgitation is mild.  12. The pulmonic valve was grossly normal. Pulmonic valve regurgitation is  mild.  13. Mildly elevated pulmonary artery systolic pressure.  14. A pacer wire is visualized.  15. The inferior vena cava is dilated in size with <50% respiratory  variability, suggesting right atrial pressure of 15 mmHg.  16. The interatrial septum was not well visualized.   Patient Profile     79 y.o. 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  Assessment & Plan    Very poor functional status makes for poor prognosis, but the acute organ-system failure problems have all stabilized and are pretty much back at baseline. He looks "dry". Will stop the furosemide and correct the hypokalemia. Entresto restarted last night, BP tolerating it so far. Discharge with home hospice today.  May  need to resume low dose diuretic if he develops fluid retention/dyspnea-orthopnea. Prior to admission he was taking it "as needed" , usually less than weekly. [facility] will sign off.   Medication Recommendations:  Amiodarone 200 mg daily; entresto 24/26 twice daily, atorvastatin 20 mg daily Furosemide 20 mg daily as needed for edema/dyspnea. Other recommendations (labs, testing, etc):  n/a Follow up as an outpatient:  Will arrange virtual visit in 2 weeks.  For questions or updates, please contact [place] Please consult [email or web address] for contact info under        Signed, Sanda Klein, MD  12/25/2019, 9:25 AM

## 2019-12-25 NOTE — Care Management (Addendum)
9:00 Requested to call wife by staff nurse, no answer, LVM.  [facility] on call nurse to call me back to clarify if equipment has been delivered to the house.  09:15 Spoke w hospice nurse who states O2 and bed have been delivered. She requets morphine script for home, I passed on tgis request to attending. Spoke w wife who also confirms equipment delivered and that she will be home today to receive him from [place].   PTAR forms on chart.  Home is without power. Will need power to be restored in order to be discharged home with oxygen.

## 2019-12-25 NOTE — Progress Notes (Signed)
Occupational Therapy Treatment Patient Details Name: Jeremy Parrish MRN: [id number] DOB: 1941-07-01 Today's Date: 12/25/2019    History of present illness Pt is a 79 y.o. male admitted 12/09/19 with cardiogenic and septic shock of unclear etiology; AF vs ischemia; complicated by worsening renal function. ETT 1/29-2/5. Required CRRT 2/4-2/5. PMH includes CVA w/ resultant aphasia (2013), multiple myeloma, bladder CA, prostate CA, COPD, HTN, R rotator cuff repair.   OT comments  Pt presents supine in bed agreeable to working with therapy. Pt engaging in simple ADL tasks including self feeding and grooming. Pt requiring mod-maxA for task completion including support of UEs to bring towards face to drink and for face washing task. Pt overall following simple commands throughout session. He engaged in additional gentle A/AAROM to bil UE and LEs within his tolerance, pt initiating rest breaks PRN. Noted pt now with plans for returning home with hospice services. Have updated discharge recommendations to reflect. Will continue to follow while pt remains in acute setting.   Follow Up Recommendations  Supervision/Assistance - 24 hour;Other (comment)(home with hospice services)    Equipment Recommendations  3 in 1 bedside commode          Precautions / Restrictions Precautions Precautions: Fall;Other (comment) Precaution Comments: expressive aphasia Restrictions Weight Bearing Restrictions: No       Mobility Bed Mobility Overal bed mobility: Needs Assistance             General bed mobility comments: maxA to reposition in bed as pt tending to lean towards R side, use of +2 to boost towards HOB, positioned with HOB upright to sitting to optimize position for completing ADL tasks  Transfers                 General transfer comment: deferred    Balance                                           ADL either performed or assessed with clinical judgement   ADL  Overall ADL's : Needs assistance/impaired Eating/Feeding: Moderate assistance;Sitting Eating/Feeding Details (indicate cue type and reason): pt intermittently able to hold drink using both L and R hand, assist to support UE when bringing to mouth, drinking nectar thick liquids  Grooming: Wash/dry face;Moderate assistance;Sitting Grooming Details (indicate cue type and reason): requires assist to support RUE when bringing towards face, assist to grasp washcloth initially and for thoroughness                                                        Cognition Arousal/Alertness: Awake/alert Behavior During Therapy: Flat affect Overall Cognitive Status: Difficult to assess                                 General Comments: very soft voice and often mumbled speech, overall following simple commands         Exercises Exercises: General Upper Extremity;General Lower Extremity General Exercises - Upper Extremity Shoulder Flexion: AAROM;5 reps;10 reps;Both Elbow Flexion: AAROM;Both;5 reps Elbow Extension: AAROM;Both;5 reps General Exercises - Lower Extremity Ankle Circles/Pumps: AROM;AAROM;Both;5 reps   Shoulder Instructions       General Comments  Pertinent Vitals/ Pain       Pain Assessment: Faces Faces Pain Scale: Hurts a little bit Pain Location: generalized Pain Descriptors / Indicators: Discomfort Pain Intervention(s): Limited activity within patient's tolerance;Monitored during session;Repositioned  Home Living                                          Prior Functioning/Environment              Frequency  Min 3X/week        Progress Toward Goals  OT Goals(current goals can now be found in the care plan section)  Progress towards OT goals: Progressing toward goals  Acute Rehab OT Goals Patient Stated Goal: home  OT Goal Formulation: With patient Time For Goal Achievement: 01/04/20 Potential to Achieve  Goals: Good  Plan Discharge plan needs to be updated    Co-evaluation                 AM-PAC OT "6 Clicks" Daily Activity     Outcome Measure   Help from another person eating meals?: A Lot Help from another person taking care of personal grooming?: A Lot Help from another person toileting, which includes using toliet, bedpan, or urinal?: Total Help from another person bathing (including washing, rinsing, drying)?: A Lot Help from another person to put on and taking off regular upper body clothing?: A Lot Help from another person to put on and taking off regular lower body clothing?: Total 6 Click Score: 10    End of Session Equipment Utilized During Treatment: Oxygen(6L)  OT Visit Diagnosis: Other abnormalities of gait and mobility (R26.89);Muscle weakness (generalized) (M62.81);Other symptoms and signs involving cognitive function;Cognitive communication deficit (R41.841) Symptoms and signs involving cognitive functions: Cerebral infarction(previous CVA)   Activity Tolerance Patient tolerated treatment well;Patient limited by fatigue   Patient Left in bed;with call bell/phone within reach;with bed alarm set   Nurse Communication Mobility status        Time: 4383-7793 OT Time Calculation (min): 27 min  Charges: OT General Charges $OT Visit: 1 Visit OT Treatments $Self Care/Home Management : 8-22 mins $Therapeutic Activity: 8-22 mins  Lou Cal, OT [facility] Pager [phone] Office [phone]   Raymondo Band 12/25/2019, 12:10 PM

## 2019-12-26 LAB — CBC
HCT: 35.6 % — ABNORMAL LOW (ref 39.0–52.0)
Hemoglobin: 11.1 g/dL — ABNORMAL LOW (ref 13.0–17.0)
MCH: 27.5 pg (ref 26.0–34.0)
MCHC: 31.2 g/dL (ref 30.0–36.0)
MCV: 88.3 fL (ref 80.0–100.0)
Platelets: 64 10*3/uL — ABNORMAL LOW (ref 150–400)
RBC: 4.03 MIL/uL — ABNORMAL LOW (ref 4.22–5.81)
RDW: 20.4 % — ABNORMAL HIGH (ref 11.5–15.5)
WBC: 5.9 10*3/uL (ref 4.0–10.5)
nRBC: 0 % (ref 0.0–0.2)

## 2019-12-26 LAB — BASIC METABOLIC PANEL
Anion gap: 14 (ref 5–15)
BUN: 50 mg/dL — ABNORMAL HIGH (ref 8–23)
CO2: 25 mmol/L (ref 22–32)
Calcium: 7.8 mg/dL — ABNORMAL LOW (ref 8.9–10.3)
Chloride: 108 mmol/L (ref 98–111)
Creatinine, Ser: 1.66 mg/dL — ABNORMAL HIGH (ref 0.61–1.24)
GFR calc Af Amer: 45 mL/min — ABNORMAL LOW (ref >60)
GFR calc non Af Amer: 39 mL/min — ABNORMAL LOW (ref >60)
Glucose, Bld: 113 mg/dL — ABNORMAL HIGH (ref 70–99)
Potassium: 3.1 mmol/L — ABNORMAL LOW (ref 3.5–5.1)
Sodium: 147 mmol/L — ABNORMAL HIGH (ref 135–145)

## 2019-12-26 LAB — GLUCOSE, CAPILLARY
Glucose-Capillary: 177 mg/dL — ABNORMAL HIGH (ref 70–99)
Glucose-Capillary: 173 mg/dL — ABNORMAL HIGH (ref 70–99)
Glucose-Capillary: 126 mg/dL — ABNORMAL HIGH (ref 70–99)

## 2019-12-26 MED ORDER — POTASSIUM CHLORIDE CRYS ER 20 MEQ PO TBCR
20.0000 meq | EXTENDED_RELEASE_TABLET | Freq: Two times a day (BID) | ORAL | Status: AC
  Administered 2019-12-26 – 2019-12-27 (×3): 20 meq via ORAL
  Filled 2019-12-26 (×3): qty 1

## 2019-12-26 NOTE — Progress Notes (Signed)
PROGRESS NOTE  Jeremy Parrish  MRN:[id number] DOB: 04/16/1941 DOA: 12/09/2019 PCP: Hall, John Z, MD   Brief Narrative: Patient initially admitted for decompensated atrial fibrillation with rapid ventricular response, complicated by cardiogenic and septic shock (pneumonia). He had prolonged hospital stay, requiring invasive mechanical ventilation and renal replacement therapy.   78-year-old male who presented to the hospital with worsening dyspnea. He does have significant past medical history for coronary artery disease, paroxysmal atrial fibrillation, sick sinus syndrome status post pacemaker, multiple myeloma, marginal zone lymphoma, COPD, hypertension and dyslipidemia. Patient was noted to have progressive dyspnea for 3 days, associated with agitation, bilateral extremity edema, and orthopnea. Patient symptoms were refractory to outpatient management with steroids. On his initial physical examination his heart rate was 122, temperature 97.6, blood pressure 134/87, oxygen saturation 99% on 2 L per nasal cannula. His lungs are clear to auscultation bilaterally, heart S1-S2, present, irregularly irregular, abdomen soft, 2+ bilateral lower extremity edema. Sodium 135, potassium 4.4, chloride 102, bicarb 22, glucose 128, BUN 36, creatinine 1.35.Chest radiograph with cardiomegaly and bibasilar atelectasis. EKG 121 bpm, left axis deviation, underlying rhythm atrial fibrillation with intermittent ventricular pacing no significant ST segment or T wave changes.  Patient was admitted to the hospital working diagnosis of atrial fibrillation with rapid ventricular response.  Patient developed cardiogenic shock, further work-up revealed newly depressed ejection fraction 30 to 35%.Patient was placed on vasopressors and mechanical ventilation.  Patient was further diagnosed with pneumonia due to Moraxella catarrhalis with sepsis (present on admission). Received broad-spectrum antibiotic  therapy.He suffered from acute tubular necrosis and underwent renal replacement therapy. Regained hemodynamic stability but remained on mechanical ventilation due to encephalopathy.  Patient was successfully extubated February 5.   Transferred to [facility] on 12/20/19. Patient continue to be very weak and deconditioned. Has actually continued to improve from the cardiovascular and renal perspectives, though remains severely deconditioned. Palliative care was consulted and discharge home with hospice was considered, as the patient is sure not to do well in a facility without his wife at bedside. CIR was recommended by PT/OT and is the currently planned disposition.  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  He was planned for home with hospice on 12/25/2019 but does not have electricity at home and therefore discharge on hold till tomorrow   Assessment & Plan: Principal Problem:   Atrial fibrillation with RVR (HCC) Active Problems:   Hx of lymphoma   Multiple myeloma in remission (HCC)   Anxiety state   Essential hypertension   Coronary atherosclerosis   Lt CVA with expressive aphasia Nov 2013   H/O cardiac pacemaker, [employer] REVO, MRI conditional device, placed 07/2011 for sympyomatic bradycardia   Hx of bladder cancer   Hyperlipidemia with target LDL less than 100   Prediabetes   Obstructive sleep apnea   COPD (chronic obstructive pulmonary disease) (HCC)   Acute exacerbation of CHF (congestive heart failure) (HCC)   Acute respiratory failure with hypoxia (HCC)   Cardiogenic shock (HCC)   Shock (HCC)   Pressure injury of skin   Encounter for central line placement    Acute renal failure superimposed on chronic kidney disease (HCC)   Palliative care by specialist   Goals of care, counseling/discussion    Acute metabolic encephalopathy with hypoactive delirium: Waxing/waning, possibly due to improving renal function, presence of family, recovery from severe infection. - Dysphagia 2 diet.  - Delirium precautions reviewed with wife, RN already- Continue as needed oxycodone for pain control.   Acute hypoxic respiratory failure: Improved. PCCM signed off. - Patient reporting significant shortness of breath today. Will check CXR.  - Continue supplemental oxygen as needed to maintain normal respiratory effort and SpO2 >90%.   CAD s/p PCI previously and demand ischemia: Troponin elevation not consistent with ACS. Not a candidiate for LHC with renal dysfunction.  - Continue current plan of care per cardiology.   AKI on CKD stage 3b, ATN due to shock, present on admission. Hyponatremia. Temporary HD catheter removed 02/07.  - Continue monitor renal function which continues to improve. UOP is also significantly better. ~2L in foley bag this AM accounting for seemingly less UOP currently charted. - Nephrology has signed off. Hemodialysis is not recommended long term with his clinical status.   Shock cardiogenic and septic (moraxella pneumonia present on admission). LV EF 30 to 35%, not candidate for advanced therapies. Resolved shock.  - Continue cefdinir.   Acute systolic heart failure in the setting of chronic atrial fibrillation with RVR/ NSVT.  - Tolerating well amiodarone.  - Continue holding anticoagulation due to elevated bleeding risk and thrombocytopenia.    Multiple myeloma:  - Treated at [facility]  T2DM: Well-controlled with Hgb A1c 5.6%.  - Glucose under reasonable control.   Hyperlipidemia:  - Continue statin  Stage 1 coccyx pressure ulcer, not present on admission. - Continue offloading as able, local wound care   Protein calorie  malnutrition. Unspecified severity. Taking better po however. - Continue nutritional supplements.   DVT prophylaxis: SCDs Code Status: DNR Family Communication: Wife at bedside Disposition Plan:  home with hospice.  Consultants:   Cardiology  Nephrology  PCCM  Palliative care team  Procedures:   Right IJ CVC 01/29  Subjective: No acute events per overnight.  Wife states that there is still no electricity at home.  objective: Vitals:   12/25/19 1800 12/25/19 1811 12/25/19 2021 12/26/19 0733  BP:  102/64 (!) 104/58 96/60  Pulse:  76 92 62  Resp:  (!) 22 14 16  Temp: 97.7 F (36.5 C) 98.4 F (36.9 C) 98.4 F (36.9 C) 97.8 F (36.6 C)  TempSrc: Axillary  Axillary Oral  SpO2:  100% 96% 100%  Weight:      Height:        Intake/Output Summary (Last 24 hours) at 12/26/2019 1313 Last data filed at 12/26/2019 0733 Gross per 24 hour  Intake 240 ml  Output 1400 ml  Net -1160 ml   Filed Weights   12/19/19 0422 12/23/19 0520 12/24/19 0500  Weight: 79.6 kg 74.8 kg 74.8 kg   Gen: chronically ill-appearing,NAD Pulm: Nonlabored breathing supplemental oxygen, clear and diminished laterally CV: Irreg irreg without murmur, rub, or gallop. No JVD, trace dependent edema. GI: Abdomen soft, non-tender, non-distended, with normoactive bowel sounds.  Ext: Warm, no deformities Skin: No new rashes, lesions or ulcers on visualized skin. Neuro: Alert, weakly verbal, moves all extremities but is diffusely very weak  Psych: Mood euthymic & affect congruent. Attention intact. Behavior is appropriate.    Data Reviewed: I have personally reviewed following labs and imaging studies  CBC: Recent Labs  Lab 12/20/19 1302 12/23/19 0226 12/24/19 0103 12/25/19 0320 12/26/19 0239  WBC 8.5 5.4 5.4 5.5 5.9  HGB 11.7* 10.4* 10.1* 10.7* 11.1*    HCT 35.1* 33.0* 32.1* 34.8* 35.6*  MCV 81.8 86.4 86.5 88.3 88.3  PLT 57* 66* 68* 64* 64*   Basic Metabolic Panel: Recent Labs  Lab 12/20/19 0256  12/21/19 0205 12/22/19 0245 12/23/19 0226 12/23/19 0858 12/24/19 0103 12/25/19 0320 12/26/19 0239  NA 143   < > 144 146*  --  142 144 147*  K 4.0   < > 3.1* 3.1*  --  3.8 3.1* 3.1*  CL 103   < > 110 111  --  106 110 108  CO2 21*   < > 22 25  --  _0 GLUCOSE 130*   < > 183* 123*  --  193* 121* 113*  BUN 125*   < > 93* 73*  --  63* 52* 50*  CREATININE 2.73*   < > 2.65* 2.12*  --  1.99* 1.85* 1.66*  CALCIUM 8.1*   < > 8.0* 7.7*  --  7.6* 7.7* 7.8*  MG 2.1  --   --   --  1.5*  --   --   --    < > = values in this interval not displayed.   GFR: Estimated Creatinine Clearance: 34.8 mL/min (A) (by C-G formula based on SCr of 1.66 mg/dL (H)). Liver Function Tests: No results for input(s): AST, ALT, ALKPHOS, BILITOT, PROT, ALBUMIN in the last 168 hours. Coagulation Profile: No results for input(s): INR, PROTIME in the last 168 hours. CBG: Recent Labs  Lab 12/25/19 0753 12/25/19 1702 12/25/19 2017 12/26/19 0730 12/26/19 1136  GLUCAP 94 96 100* 177* 173*   Urine analysis:    Component Value Date/Time   COLORURINE YELLOW 12/16/2019 1845   APPEARANCEUR HAZY (A) 12/16/2019 1845   LABSPEC 1.014 12/16/2019 1845   PHURINE 5.0 12/16/2019 1845   GLUCOSEU 50 (A) 12/16/2019 1845   HGBUR LARGE (A) 12/16/2019 1845   BILIRUBINUR NEGATIVE 12/16/2019 [place] 12/16/2019 1845   PROTEINUR 30 (A) 12/16/2019 1845   NITRITE NEGATIVE 12/16/2019 1845   LEUKOCYTESUR NEGATIVE 12/16/2019 1845   Radiology Studies: No results found.  Scheduled Meds: . amiodarone  200 mg Oral BID  . atorvastatin  10 mg Oral q1800  . B-complex with vitamin C  1 tablet Oral Daily  . chlorhexidine gluconate (MEDLINE KIT)  15 mL Mouth Rinse BID  . Chlorhexidine Gluconate Cloth  6 each Topical Daily  . dextrose  50 mL Intravenous Once  . feeding supplement (ENSURE ENLIVE)  237 mL Oral TID BM  . OLANZapine  2.5 mg Oral QHS  . potassium chloride  20 mEq Oral BID  . sacubitril-valsartan  1  tablet Oral BID  . sodium chloride flush  3 mL Intravenous Q12H   Continuous Infusions: . sodium chloride Stopped (12/19/19 1112)     LOS: 17 days   Time spent: 25 minutes.   Benito Mccreedy, MD [facility] [email or web address] 12/26/2019, 1:13 PM

## 2019-12-26 NOTE — Progress Notes (Signed)
Progress Note  Patient Name: Jeremy Parrish Date of Encounter: 12/26/2019  Primary Cardiologist: Sanda Klein, MD   Subjective   Much more alert and communicative. Improved UO without diuretic. Improving BUN and creat. K 3.1.  Inpatient Medications    Scheduled Meds: . amiodarone  200 mg Oral BID  . atorvastatin  10 mg Oral q1800  . B-complex with vitamin C  1 tablet Oral Daily  . chlorhexidine gluconate (MEDLINE KIT)  15 mL Mouth Rinse BID  . Chlorhexidine Gluconate Cloth  6 each Topical Daily  . dextrose  50 mL Intravenous Once  . feeding supplement (ENSURE ENLIVE)  237 mL Oral TID BM  . OLANZapine  2.5 mg Oral QHS  . potassium chloride  20 mEq Oral BID  . sacubitril-valsartan  1 tablet Oral BID  . sodium chloride flush  3 mL Intravenous Q12H   Continuous Infusions: . sodium chloride Stopped (12/19/19 1112)   PRN Meds: sodium chloride, acetaminophen **OR** acetaminophen, albuterol, artificial tears, bisacodyl, Gerhardt's butt cream, hyoscyamine, loperamide, [DISCONTINUED] ondansetron **OR** ondansetron (ZOFRAN) IV, oxyCODONE, Resource ThickenUp Clear, sodium chloride flush, sodium chloride flush   Vital Signs    Vitals:   12/25/19 1800 12/25/19 1811 12/25/19 2021 12/26/19 0733  BP:  102/64 (!) 104/58 96/60  Pulse:  76 92 62  Resp:  (!) '22 14 16  '$ Temp: 97.7 F (36.5 C) 98.4 F (36.9 C) 98.4 F (36.9 C) 97.8 F (36.6 C)  TempSrc: Axillary  Axillary Oral  SpO2:  100% 96% 100%  Weight:      Height:        Intake/Output Summary (Last 24 hours) at 12/26/2019 1155 Last data filed at 12/26/2019 0733 Gross per 24 hour  Intake 240 ml  Output 1400 ml  Net -1160 ml   Last 3 Weights 12/24/2019 12/23/2019 12/19/2019  Weight (lbs) 164 lb 14.5 oz 164 lb 14.5 oz 175 lb 7.8 oz  Weight (kg) 74.8 kg 74.8 kg 79.6 kg      Telemetry    AFib, rate controlled - Personally Reviewed  ECG    No new tracing - Personally Reviewed  Physical Exam  Still appears weak, but  better than yesterday GEN: No acute distress.   Neck: No JVD Cardiac: irregular, no murmurs, rubs, or gallops.  Respiratory: Clear to auscultation bilaterally. GI: Soft, nontender, non-distended  MS: No edema; No deformity. Neuro:  Nonfocal  Psych: Normal affect   Labs    High Sensitivity Troponin:   Recent Labs  Lab 12/09/19 0130 12/09/19 0355 12/09/19 0930 12/09/19 1321  TROPONINIHS 37* 30* 43* 43*      Chemistry Recent Labs  Lab 12/24/19 0103 12/25/19 0320 12/26/19 0239  NA 142 144 147*  K 3.8 3.1* 3.1*  CL 106 110 108  CO2 '26 25 25  '$ GLUCOSE 193* 121* 113*  BUN 63* 52* 50*  CREATININE 1.99* 1.85* 1.66*  CALCIUM 7.6* 7.7* 7.8*  GFRNONAA 31* 34* 39*  GFRAA 36* 40* 45*  ANIONGAP '10 9 14     '$ Hematology Recent Labs  Lab 12/24/19 0103 12/25/19 0320 12/26/19 0239  WBC 5.4 5.5 5.9  RBC 3.71* 3.94* 4.03*  HGB 10.1* 10.7* 11.1*  HCT 32.1* 34.8* 35.6*  MCV 86.5 88.3 88.3  MCH 27.2 27.2 27.5  MCHC 31.5 30.7 31.2  RDW 20.2* 20.4* 20.4*  PLT 68* 64* 64*    BNPNo results for input(s): BNP, PROBNP in the last 168 hours.   DDimer No results for input(s): DDIMER in the  last 168 hours.   Radiology    No results found.  Cardiac Studies   ECHO 12/09/2019 1. Left ventricular ejection fraction, by visual estimation, is 30 to  35%. The left ventricle has severely decreased function. There is mildly  increased left ventricular hypertrophy.  2. Left ventricular diastolic function could not be evaluated.  3. The left ventricle demonstrates global hypokinesis.  4. Global right ventricle has mildly reduced systolic function.The right  ventricular size is moderately enlarged. No increase in right ventricular  wall thickness.  5. Left atrial size was severely dilated.  6. Right atrial size was moderately dilated.  7. The mitral valve is grossly normal. Moderate mitral valve  regurgitation.  8. The tricuspid valve is grossly normal.  9. The tricuspid valve  is grossly normal. Tricuspid valve regurgitation  is moderate.  10. The aortic valve is tricuspid. Aortic valve regurgitation is mild. No  evidence of aortic valve sclerosis or stenosis.  11. Pulmonic regurgitation is mild.  12. The pulmonic valve was grossly normal. Pulmonic valve regurgitation is  mild.  13. Mildly elevated pulmonary artery systolic pressure.  14. A pacer wire is visualized.  15. The inferior vena cava is dilated in size with <50% respiratory  variability, suggesting right atrial pressure of 15 mmHg.  16. The interatrial septum was not well visualized.    Patient Profile     79 y.o. male recovering from septic/cardiogenic shock and acute respiratory failure and acute on chronic renal failure, on background of chronic systolic HF (EF now acutely worsened to 30-35%), persistent atrial fibrillation, CAD s/p multiple PCI and inferior wall scar, expressive aphasia due to remote embolic stroke, pacemaker for SSS, multiple myeloma.  Assessment & Plan    Suspect he is in relative "polyuric" recovery phase post ATN and this explains clinical hypovolemia and persistent hypokalemia. Unable to leave due to power outage at home, but this gives [place] an opportunity to tune up his electrolytes. No diuretics today. Reassess in AM before anticipated DC.  Medication Recommendations:  Amiodarone 200 mg daily; entresto 24/26 twice daily, atorvastatin 20 mg daily Furosemide 20 mg daily only as needed for edema/dyspnea. Other recommendations (labs, testing, etc):  n/a Follow up as an outpatient:  Will arrange virtual visit in 2 weeks.   For questions or updates, please contact [place] Please consult [email or web address] for contact info under        Signed, Sanda Klein, MD  12/26/2019, 11:55 AM

## 2019-12-27 LAB — CBC
HCT: 33.9 % — ABNORMAL LOW (ref 39.0–52.0)
Hemoglobin: 10.5 g/dL — ABNORMAL LOW (ref 13.0–17.0)
MCH: 27.2 pg (ref 26.0–34.0)
MCHC: 31 g/dL (ref 30.0–36.0)
MCV: 87.8 fL (ref 80.0–100.0)
Platelets: 64 10*3/uL — ABNORMAL LOW (ref 150–400)
RBC: 3.86 MIL/uL — ABNORMAL LOW (ref 4.22–5.81)
RDW: 20.3 % — ABNORMAL HIGH (ref 11.5–15.5)
WBC: 8.5 10*3/uL (ref 4.0–10.5)
nRBC: 0 % (ref 0.0–0.2)

## 2019-12-27 LAB — BASIC METABOLIC PANEL
Anion gap: 11 (ref 5–15)
BUN: 52 mg/dL — ABNORMAL HIGH (ref 8–23)
CO2: 25 mmol/L (ref 22–32)
Calcium: 7.7 mg/dL — ABNORMAL LOW (ref 8.9–10.3)
Chloride: 110 mmol/L (ref 98–111)
Creatinine, Ser: 1.81 mg/dL — ABNORMAL HIGH (ref 0.61–1.24)
GFR calc Af Amer: 41 mL/min — ABNORMAL LOW (ref >60)
GFR calc non Af Amer: 35 mL/min — ABNORMAL LOW (ref >60)
Glucose, Bld: 141 mg/dL — ABNORMAL HIGH (ref 70–99)
Potassium: 3 mmol/L — ABNORMAL LOW (ref 3.5–5.1)
Sodium: 146 mmol/L — ABNORMAL HIGH (ref 135–145)

## 2019-12-27 LAB — GLUCOSE, CAPILLARY
Glucose-Capillary: 127 mg/dL — ABNORMAL HIGH (ref 70–99)
Glucose-Capillary: 101 mg/dL — ABNORMAL HIGH (ref 70–99)
Glucose-Capillary: 132 mg/dL — ABNORMAL HIGH (ref 70–99)

## 2019-12-27 MED ORDER — HYDROXYZINE HCL 10 MG PO TABS: 10.0000 mg | ORAL_TABLET | Freq: Three times a day (TID) | ORAL | 0 refills | Status: AC | PRN

## 2019-12-27 MED ORDER — GERHARDT'S BUTT CREAM: 1.0000 "application " | TOPICAL_CREAM | CUTANEOUS | 1 refills | Status: AC | PRN

## 2019-12-27 MED ORDER — ENSURE ENLIVE PO LIQD: 237.0000 mL | Freq: Three times a day (TID) | ORAL | 12 refills | Status: AC

## 2019-12-27 MED ORDER — RESOURCE THICKENUP CLEAR PO POWD: 1.0000 | ORAL | 0 refills | Status: AC | PRN

## 2019-12-27 MED ORDER — AMIODARONE HCL 200 MG PO TABS: 200.0000 mg | ORAL_TABLET | Freq: Two times a day (BID) | ORAL | 0 refills | Status: AC

## 2019-12-27 MED ORDER — OXYCODONE HCL 5 MG PO TABS: 5.0000 mg | ORAL_TABLET | Freq: Four times a day (QID) | ORAL | 0 refills | Status: AC | PRN

## 2019-12-27 MED ORDER — SACUBITRIL-VALSARTAN 24-26 MG PO TABS: 1.0000 | ORAL_TABLET | Freq: Two times a day (BID) | ORAL | 1 refills | Status: AC

## 2019-12-27 MED ORDER — DIPHENHYDRAMINE HCL 50 MG/ML IJ SOLN: 12.5000 mg | Freq: Once | INTRAMUSCULAR | Status: DC

## 2019-12-27 MED ORDER — CHLORHEXIDINE GLUCONATE CLOTH 2 % EX PADS: 6.0000 | MEDICATED_PAD | Freq: Every day | CUTANEOUS | 1 refills | Status: AC

## 2019-12-27 NOTE — Progress Notes (Signed)
Physical Therapy Discharge Patient Details Name: Jeremy Parrish MRN: [id number] DOB: 1941-11-05 Today's Date: 12/27/2019 Time:  -     Patient discharged from PT services secondary to patient lethargic and to discharge home with Hospice support.  Please see latest therapy progress note for current level of functioning and progress toward goals.    Hospice is involved and will provide all necessary DME. No further PT needs.   Progress and discharge plan discussed with patient and/or caregiver: Patient unable to participate in discharge planning and no caregivers available  GP     Arby Barrette, PT Pager [phone]  Rexanne Mano 12/27/2019, 11:28 AM

## 2019-12-27 NOTE — Discharge Summary (Addendum)
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   H/O cardiac pacemaker, Medtronic REVO, MRI conditional device, placed 07/2011 for sympyomatic bradycardia   Hx of bladder cancer   Hyperlipidemia with target LDL less than 100   Prediabetes   Obstructive sleep apnea   COPD (chronic obstructive pulmonary disease) (HCC)   Acute respiratory failure with hypoxia (HCC)   Shock ([place])   Pressure injury of skin   Encounter for central line placement   Acute renal failure superimposed on chronic kidney disease ([place])   Palliative care by specialist   Goals of care, counseling/discussion   Hospital Course Summary: 79 year old male with h/o multiple myeloma, marginal zone lymphoma, COPD, PAF-SSS s/p pacemaker, expressive aphasia due to remote embolic CVA, CAD s/p multiple PCI with inferior wall scar,chronic systolic HF,HTN and dyslipidemia presented to the hospital with c/o progressive dyspnea for 3 days, associated with agitation, bilateral extremity edema, and orthopnea. Patient symptoms were refractory to outpatient management with steroids.  ED Course: Afebrile heart rate was 122, blood pressure 134/87, oxygen saturation 99% on 2 L per nasal cannula. Noted to have irregular heart rhythm with 2+ bilateral lower extremity edema.Sodium 135, potassium 4.4, chloride 102, bicarb 22, glucose 128, BUN 36, creatinine 1.35.Chest radiograph with cardiomegaly and bibasilar  atelectasis. EKG 121 bpm, left axis deviation, underlying rhythm atrial fibrillation with intermittent ventricular pacing no significant ST segment or T wave changes. Hospital course: Patient admitted to [facility] with diagnosis of atrial fibrillation with RVR.Patient developed cardiogenic shock, further work-up revealed newly depressed ejection fraction 30 to 35%.Patient required vasopressors and mechanical ventilation. Patient wasfurtherdiagnosed with pneumonia due to Moraxella catarrhalis with sepsis (present on admission). Received broad-spectrum antibiotic therapy.HC complicated by ATN related renal failure requiring CRRT.He regained hemodynamic stability but required prolonged mechanical ventilation due to encephalopathy-successfully extubated February 5.  Transferred to [facility] on 12/20/19. Patient continue to be very weak and deconditioned.Has Stage 1 coccyx pressure ulcer, not present on admission. On ceftidinir, amiodarone, not felt to be candidate for anticoagulation due to bleeding risk/thrombocytopenia.Nephrology has signed off. Hemodialysis is not recommended long term with his clinical status. CIR was recommended by PT/OT.Palliative care was consulted and discharge home with hospice was considered, as it was felt patient would do poorly in a facility without his wife at bedside.He was plannedfor home with hospice on2/13/2021 but does not have electricity at home, therefore discharge plan held. Informed by case manager that his house has power now. Wife at bedside and comfortable with hospice choice/goals of care. She states hospital bed and home 02 have already been set up over the weekend. Patient is awake and talking, requiring 5 lits o2. He reports feeling somewhat anxious but in no acute distress. Wife would like current medications to be continued until followed up by Hospice MD.  Acute systolic heart failure in the setting of chronic atrial fibrillation with RVR/ NSVT.Started on  amiodarone.Tolerating well. Continue holding anticoagulation due toelevatedbleeding risk and thrombocytopenia.Wife understands risk of stroke.  Acute hypoxic respiratory failure:  Improved. PCCM signed off.- Continue supplemental oxygen 5lits, titrate as needed to maintain normal respiratory effort and SpO2 >90%. Resume home neb treatments   Shock cardiogenic and septic (moraxella pneumonia present on admission). LV EF 30 to 35%, not candidate for advanced therapies.Resolved shock. Completed antibiotics, remains on Entresto--Hospice MD to f/u regarding utility and risks of this med if his condition were to deteriorate especially if cannot do labs to monitor renal function.   Acute metabolic encephalopathy with hypoactive delirium: Inproved with zyprexa but still has intermittent confusion. Avoid benzos , okay to use hydroxyzine prn for anxiety. Mental status also improved with improving renal function, presence of family, recovery from severe infection.Dysphagia 2 diet. Delirium precautions reviewed with wife, RN already- Continue as needed oxycodone for pain control given comfort goals.    CAD s/p PCI previously and demand ischemia: Troponin elevation not consistent with ACS. Not a candidiate for LHC with renal dysfunction.  Off plavix now due to thrombocytopenia, resumed statins.   AKI on CKD stage 3b, ATN due to shock, present on admission. Hyponatremia.Temporary HD catheter removed 02/07. Serial labs for renal function show improvement. UOP is also significantly better. Nephrology has signed off. Hemodialysis is not recommended long term with his clinical status.    Multiple myeloma: - Treated at [facility]  T2DM: Well-controlled with Hgb A1c 5.6%. - Glucose under reasonable control.   Hyperlipidemia: - Continue statins for now  Stage 1 coccyx pressure ulcer, not present on admission.- Continue offloading as able, local wound care per hospice team  Protein calorie malnutrition.  Unspecified severity. Taking better po however. Continue nutritional supplements as tolerated with comfort goals.   Discharge Exam:  Vitals:   12/27/19 0300 12/27/19 0800  BP: 96/60 116/67  Pulse: 85 72  Resp:    Temp: 97.7 F (36.5 C) (!) 97.4 F (36.3 C)  SpO2: 96% 97%   Vitals:   12/26/19 1634 12/26/19 1953 12/27/19 0300 12/27/19 0800  BP: 105/74 101/61 96/60 116/67  Pulse: 64 86 85 72  Resp: 16     Temp: 98.6 F (37 C) 97.8 F (36.6 C) 97.7 F (36.5 C) (!) 97.4 F (36.3 C)  TempSrc: Oral Oral Oral Axillary  SpO2: 96% 97% 96% 97%  Weight:      Height:        General: Pt is somnolent, awake, not in acute distress Cardiovascular: RRR, S1/S2 +, no rubs, no gallops Respiratory: CTA bilaterally, no wheezing, mild basal crackles Abdominal: Soft, NT, ND, bowel sounds + Extremities: no edema, no cyanosis  Discharge Condition:Stable CODE STATUS: DNR Diet recommendation: comfort diet Recommendations for Outpatient Follow-up:  1. Follow up with PCP/Hospice MD in 5 days   Home Health services upon discharge:  Home hospice Equipment/Devices upon discharge: O2 5lits, indwelling foley for comfort   Discharge Instructions:  Discharge Instructions    (HEART FAILURE PATIENTS) Call MD:  Anytime you have any of the following symptoms: 1) 3 pound weight gain in 24 hours or 5 pounds in 1 week 2) shortness of breath, with or without a dry hacking cough 3) swelling in the hands, feet or stomach 4) if you have to sleep on extra pillows at night in order to breathe.   Complete by: As directed    Call MD for:  difficulty breathing, headache or visual disturbances   Complete by: As directed    Call MD for:  extreme fatigue   Complete by: As directed    Call MD for:  severe uncontrolled pain  Complete by: As directed    Call MD for:  temperature >100.4   Complete by: As directed    Diet - low sodium heart healthy   Complete by: As directed    Increase activity slowly   Complete  by: As directed      Allergies as of 12/27/2019      Reactions   Morphine And Related Other (See Comments)   hallucinations   Tape Rash   Paper tape is ok      Medication List    STOP taking these medications   ALPRAZolam 0.25 MG tablet Commonly known as: XANAX   amLODipine 5 MG tablet Commonly known as: NORVASC   clopidogrel 75 MG tablet Commonly known as: PLAVIX   diltiazem 120 MG 24 hr capsule Commonly known as: Cardizem CD   Eliquis 5 MG Tabs tablet Generic drug: apixaban   HYDROcodone-acetaminophen 5-325 MG tablet Commonly known as: NORCO/VICODIN   methocarbamol 750 MG tablet Commonly known as: ROBAXIN   omeprazole 20 MG capsule Commonly known as: PRILOSEC   potassium chloride SA 20 MEQ tablet Commonly known as: Klor-Con M20     TAKE these medications   albuterol (2.5 MG/3ML) 0.083% nebulizer solution Commonly known as: PROVENTIL Take 2.5 mg by nebulization every 4 (four) hours as needed for wheezing or shortness of breath.   albuterol 108 (90 Base) MCG/ACT inhaler Commonly known as: VENTOLIN HFA Inhale 2 puffs into the lungs every 6 (six) hours as needed for wheezing or shortness of breath.   amiodarone 200 MG tablet Commonly known as: PACERONE Take 1 tablet (200 mg total) by mouth 2 (two) times daily.   Chlorhexidine Gluconate Cloth 2 % Pads Apply 6 each topically daily.   feeding supplement (ENSURE ENLIVE) Liqd Take 237 mLs by mouth 3 (three) times daily between meals.   furosemide 20 MG tablet Commonly known as: LASIX Use sparingly as needed for edema. No more than one tablet by mouth per week.   Gerhardt's butt cream Crea Apply 1 application topically as needed for irritation (MASD).   hydrOXYzine 10 MG tablet Commonly known as: ATARAX/VISTARIL Take 1 tablet (10 mg total) by mouth 3 (three) times daily as needed for anxiety.   ipratropium-albuterol 0.5-2.5 (3) MG/3ML Soln Commonly known as: DUONEB Take 3 mLs by nebulization every 6  (six) hours as needed (shortness of breath).   lenalidomide 10 MG capsule Commonly known as: Revlimid TAKE 1 CAPSULE BY MOUTH  DAILY FOR 7 DAYS ON THEN 7  DAYS OFF, THEN REPEAT CYCLE What changed: additional instructions   levocetirizine 5 MG tablet Commonly known as: XYZAL Take 5 mg by mouth every evening.   loperamide 2 MG capsule Commonly known as: IMODIUM TAKE 2 CAPSULES BY MOUTH 2 TIMES DAILY AS NEEDED FOR DIARRHEA OR LOOSE STOOLS.   mometasone 50 MCG/ACT nasal spray Commonly known as: NASONEX Place 2 sprays into the nose daily as needed (allergies).   nitroGLYCERIN 0.4 MG SL tablet Commonly known as: NITROSTAT PLACE 1 TABLET UNDER THE TONGUE EVERY 5 MINUTES AS NEEDED FOR CHEST PAIN.   ondansetron 4 MG tablet Commonly known as: ZOFRAN Take 4 mg by mouth every 8 (eight) hours as needed for nausea or vomiting.   oxyCODONE 5 MG immediate release tablet Commonly known as: Oxy IR/ROXICODONE Take 1 tablet (5 mg total) by mouth every 6 (six) hours as needed for moderate pain or severe pain.   pantoprazole 40 MG tablet Commonly known as: PROTONIX Take 40 mg by mouth daily.  Resource ThickenUp Clear Powd Take 120 g by mouth as needed for up to 10 days.   sacubitril-valsartan 24-26 MG Commonly known as: ENTRESTO Take 1 tablet by mouth 2 (two) times daily.   SandoSTATIN LAR [place] 10 MG injection Generic drug: octreotide Inject 10 mg into the muscle every 28 (twenty-eight) days.   simvastatin 20 MG tablet Commonly known as: ZOCOR Take 1 tablet (20 mg total) by mouth daily at 6 PM.      Follow-up Information    Celene Squibb, MD.   Specialty: Internal Medicine Contact information: [facility] [address] [place], [facility] Follow up.   Contact information: [address] [place] [address] [phone]          Allergies  Allergen Reactions  . Morphine And Related Other (See Comments)    hallucinations   . Tape Rash    Paper tape is ok      The results of significant diagnostics from this hospitalization (including imaging, microbiology, ancillary and laboratory) are listed below for reference.    Labs: BNP (last 3 results) Recent Labs    12/09/19 0130  BNP 0,929.5*   Basic Metabolic Panel: Recent Labs  Lab 12/23/19 0226 12/23/19 0858 12/24/19 0103 12/25/19 0320 12/26/19 0239 12/27/19 0246  NA 146*  --  142 144 147* 146*  K 3.1*  --  3.8 3.1* 3.1* 3.0*  CL 111  --  106 110 108 110  CO2 25  --  _0 GLUCOSE 123*  --  193* 121* 113* 141*  BUN 73*  --  63* 52* 50* 52*  CREATININE 2.12*  --  1.99* 1.85* 1.66* 1.81*  CALCIUM 7.7*  --  7.6* 7.7* 7.8* 7.7*  MG  --  1.5*  --   --   --   --    Liver Function Tests: No results for input(s): AST, ALT, ALKPHOS, BILITOT, PROT, ALBUMIN in the last 168 hours. No results for input(s): LIPASE, AMYLASE in the last 168 hours. No results for input(s): AMMONIA in the last 168 hours. CBC: Recent Labs  Lab 12/23/19 0226 12/24/19 0103 12/25/19 0320 12/26/19 0239 12/27/19 0246  WBC 5.4 5.4 5.5 5.9 8.5  HGB 10.4* 10.1* 10.7* 11.1* 10.5*  HCT 33.0* 32.1* 34.8* 35.6* 33.9*  MCV 86.4 86.5 88.3 88.3 87.8  PLT 66* 68* 64* 64* 64*   Cardiac Enzymes: No results for input(s): CKTOTAL, CKMB, CKMBINDEX, TROPONINI in the last 168 hours. BNP: Invalid input(s): POCBNP CBG: Recent Labs  Lab 12/26/19 0730 12/26/19 1136 12/26/19 1634 12/27/19 0956 12/27/19 1311  GLUCAP 177* 173* 126* 101* 132*   D-Dimer No results for input(s): DDIMER in the last 72 hours. Hgb A1c No results for input(s): HGBA1C in the last 72 hours. Lipid Profile No results for input(s): CHOL, HDL, LDLCALC, TRIG, CHOLHDL, LDLDIRECT in the last 72 hours. Thyroid function studies No results for input(s): TSH, T4TOTAL, T3FREE, THYROIDAB in the last 72 hours.  Invalid input(s): FREET3 Anemia work up No results for input(s): VITAMINB12, FOLATE, FERRITIN,  TIBC, IRON, RETICCTPCT in the last 72 hours. Urinalysis    Component Value Date/Time   COLORURINE YELLOW 12/16/2019 1845   APPEARANCEUR HAZY (A) 12/16/2019 1845   LABSPEC 1.014 12/16/2019 1845   PHURINE 5.0 12/16/2019 1845   GLUCOSEU 50 (A) 12/16/2019 1845   HGBUR LARGE (A) 12/16/2019 1845   BILIRUBINUR NEGATIVE 12/16/2019 1845   KETONESUR NEGATIVE  12/16/2019 1845   PROTEINUR 30 (A) 12/16/2019 1845   NITRITE NEGATIVE 12/16/2019 1845   LEUKOCYTESUR NEGATIVE 12/16/2019 1845   Sepsis Labs Invalid input(s): PROCALCITONIN,  WBC,  LACTICIDVEN Microbiology No results found for this or any previous visit (from the past 240 hour(s)).  Procedures/Studies: DG Chest 2 View  Result Date: 12/06/2019 CLINICAL DATA:  79 year old male with progressive shortness of breath over the past few days. EXAM: CHEST - 2 VIEW COMPARISON:  Prior chest x-ray 10/12/2018 FINDINGS: Right IJ approach single-lumen power injectable port catheter. Catheter tip overlies the superior cavoatrial junction. Left subclavian approach cardiac rhythm maintenance device. Leads remain in unchanged position projecting over the right atrium and right ventricle. The appearance of cardiomegaly likely exaggerated secondary to frontal technique. Atherosclerotic calcifications are visible in the transverse aorta. Linear atelectasis versus scarring in the lingula. Chronic bronchitic changes remain stable. Mild hyperinflation. No pleural effusion, pneumothorax or focal airspace consolidation. Bilateral glenohumeral joint osteoarthritis. IMPRESSION: 1. Stable chest x-ray without evidence of acute cardiopulmonary process. 2. Well-positioned right IJ approach single-lumen power injectable port catheter. 3. Stable positioning of cardiac rhythm maintenance device. Electronically Signed   By: Jacqulynn Cadet M.D.   On: 12/06/2019 10:13   US RENAL  Result Date: 12/16/2019 CLINICAL DATA:  79 year old male with acute renal insufficiency. EXAM: RENAL /  URINARY TRACT ULTRASOUND COMPLETE COMPARISON:  Abdominal ultrasound dated 12/04/2017 and CT abdomen pelvis dated 11/18/2019. FINDINGS: Right Kidney: Renal measurements: 10.1 x 5.1 x 3.0 cm = volume: 80 mL. Mild parenchyma atrophy. Normal echogenicity. No hydronephrosis or shadowing stone. Left Kidney: Renal measurements: 11.1 x 5.0 x 4.1 cm = volume: 120 mL. Mild parenchyma atrophy. Normal echogenicity. No hydronephrosis or shadowing stone. There is a 3.8 x 4.2 x 3.5 cm left renal inferior pole cyst. Bladder: Appears normal for degree of bladder distention. Other: Fatty infiltration of the visualized liver. Trace perihepatic fluid (image 17/52 and 4/52). IMPRESSION: 1. Mild parenchyma atrophy.  No hydronephrosis or shadowing stone. 2. Left renal inferior pole cyst. 3. Fatty liver 4. Probable trace perihepatic fluid. Electronically Signed   By: Anner Crete M.D.   On: 12/16/2019 21:14   DG CHEST PORT 1 VIEW  Result Date: 12/24/2019 CLINICAL DATA:  Acute respiratory failure, COPD EXAM: PORTABLE CHEST 1 VIEW COMPARISON:  12/12/2019 FINDINGS: Right-sided Port-A-Cath with the tip in satisfactory position. Dual lead cardiac pacemaker. No focal consolidation, pleural effusion or pneumothorax. Stable cardiomediastinal silhouette. No aggressive osseous lesion. 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 IMPRESSION: No acute cardiopulmonary disease. Electronically Signed   By: Kathreen Devoid   On: 12/24/2019 11:44   DG Chest Port 1 View  Result Date: 12/12/2019 CLINICAL DATA:  Respiratory failure.  Ventilator support. EXAM: PORTABLE CHEST 1 VIEW COMPARISON:  12/10/2019 FINDINGS: Endotracheal tube tip is 3 cm above the carina. Orogastric or nasogastric tube enters the stomach. Power port tip in the SVC at the azygos level. Dual lead pacemaker appears unchanged. Cardiomegaly as seen previously. Enlarging effusion on the left with worsened left lower lobe atelectasis. Slight worsening of right lower lobe  atelectasis as well. IMPRESSION: Lines and tubes well position. Enlarging left effusion. Worsening basilar atelectasis. Electronically Signed   By: Nelson Chimes M.D.   On: 12/12/2019 06:31   Portable Chest xray  Result Date: 12/10/2019 CLINICAL DATA:  Respiratory failure EXAM: PORTABLE CHEST 1 VIEW COMPARISON:  Earlier today FINDINGS: Endotracheal tube tip is just below the clavicular heads. The enteric tube reaches the stomach. Right IJ line and right porta  catheter with tips at the upper cavoatrial junction. Dual-chamber pacer leads from the left. Hazy and streaky density at the lung bases. No Kerley lines or pneumothorax. Mild cardiomegaly is stable. IMPRESSION: 1. Stable hardware positioning. 2. Lower lobe atelectasis or infiltrates. Electronically Signed   By: Monte Fantasia M.D.   On: 12/10/2019 05:34   DG CHEST PORT 1 VIEW  Result Date: 12/10/2019 CLINICAL DATA:  Central line, OG placement EXAM: PORTABLE CHEST 1 VIEW COMPARISON:  12/09/2019 FINDINGS: Endotracheal tube is 5 cm above the carina. Right Port-A-Cath is unchanged. Interval placement of right central line with the tip in the SVC. No pneumothorax. NG tube is in the stomach. Cardiomegaly with vascular congestion. No confluent opacities or edema. No acute bony abnormality. IMPRESSION: Support devices in expected position as above. Cardiomegaly, vascular congestion. Electronically Signed   By: Rolm Baptise M.D.   On: 12/10/2019 01:18   DG CHEST PORT 1 VIEW  Result Date: 12/09/2019 CLINICAL DATA:  Shortness of breath EXAM: PORTABLE CHEST 1 VIEW COMPARISON:  Chest radiograph dated 12/09/2019 FINDINGS: A left subclavian approach cardiac device is redemonstrated. A right internal jugular central venous port catheter is unchanged in position, however the tip is obscured. The heart remains enlarged. Vascular calcifications are seen in the aortic arch. Mild bibasilar atelectasis is unchanged. No pleural effusion or pneumothorax. IMPRESSION:  Unchanged cardiomegaly and mild bibasilar atelectasis. Electronically Signed   By: Zerita Boers M.D.   On: 12/09/2019 21:33   DG Chest Portable 1 View  Result Date: 12/09/2019 CLINICAL DATA:  Shortness of breath EXAM: PORTABLE CHEST 1 VIEW COMPARISON:  12/06/2019 FINDINGS: Cardiomegaly. Right Port-A-Cath and left pacer remain in place, unchanged. Bibasilar scarring or atelectasis. No confluent opacities, effusions or edema. IMPRESSION: Cardiomegaly.  Bibasilar scarring or atelectasis. Electronically Signed   By: Rolm Baptise M.D.   On: 12/09/2019 01:57   ECHOCARDIOGRAM COMPLETE  Result Date: 12/09/2019   ECHOCARDIOGRAM REPORT   Patient Name:   EDIS HUISH Date of Exam: 12/09/2019 Medical Rec #:  [id number]       Height:       67.0 in Accession #:    [id number]      Weight:       174.6 lb Date of Birth:  October 21, 1941       BSA:          1.91 m Patient Age:    14 years        BP:           120/79 mmHg Patient Gender: M               HR:           98 bpm. Exam Location:  [facility] Procedure: 2D Echo, Cardiac Doppler and Color Doppler Indications:    I48.0 Paroxysmal atrial fibrillation  History:        Patient has prior history of Echocardiogram examinations, most                 recent 11/13/2018. CHF, CAD and Previous Myocardial Infarction,                 Stroke and COPD, Arrythmias:Atrial Fibrillation,                 Signs/Symptoms:Dyspnea and Murmur; Risk Factors:Hypertension,                 Dyslipidemia and Sleep Apnea. Aortic Aneurysm.  Sonographer:    Jonelle Sidle Dance Referring Phys: [id number]  M GADHIA IMPRESSIONS  1. Left ventricular ejection fraction, by visual estimation, is 30 to 35%. The left ventricle has severely decreased function. There is mildly increased left ventricular hypertrophy.  2. Left ventricular diastolic function could not be evaluated.  3. The left ventricle demonstrates global hypokinesis.  4. Global right ventricle has mildly reduced systolic function.The right ventricular  size is moderately enlarged. No increase in right ventricular wall thickness.  5. Left atrial size was severely dilated.  6. Right atrial size was moderately dilated.  7. The mitral valve is grossly normal. Moderate mitral valve regurgitation.  8. The tricuspid valve is grossly normal.  9. The tricuspid valve is grossly normal. Tricuspid valve regurgitation is moderate. 10. The aortic valve is tricuspid. Aortic valve regurgitation is mild. No evidence of aortic valve sclerosis or stenosis. 11. Pulmonic regurgitation is mild. 12. The pulmonic valve was grossly normal. Pulmonic valve regurgitation is mild. 13. Mildly elevated pulmonary artery systolic pressure. 14. A pacer wire is visualized. 15. The inferior vena cava is dilated in size with <50% respiratory variability, suggesting right atrial pressure of 15 mmHg. 16. The interatrial septum was not well visualized. FINDINGS  Left Ventricle: Left ventricular ejection fraction, by visual estimation, is 30 to 35%. The left ventricle has severely decreased function. The left ventricle demonstrates global hypokinesis. There is mildly increased left ventricular hypertrophy. Septal left ventricular hypertrophy. The left ventricular diastology could not be evaluated due to atrial fibrillation. Left ventricular diastolic function could not be evaluated. Right Ventricle: The right ventricular size is moderately enlarged. No increase in right ventricular wall thickness. Global RV systolic function is has mildly reduced systolic function. The tricuspid regurgitant velocity is 2.49 m/s, and with an assumed right atrial pressure of 15 mmHg, the estimated right ventricular systolic pressure is mildly elevated at 39.7 mmHg. Left Atrium: Left atrial size was severely dilated. Right Atrium: Right atrial size was moderately dilated Pericardium: There is no evidence of pericardial effusion. Mitral Valve: The mitral valve is grossly normal. There is mild thickening of the mitral valve  leaflet(s). Moderate mitral valve regurgitation. Tricuspid Valve: The tricuspid valve is grossly normal. Tricuspid valve regurgitation is moderate. Aortic Valve: The aortic valve is tricuspid. Aortic valve regurgitation is mild. The aortic valve is structurally normal, with no evidence of sclerosis or stenosis. Mild aortic valve annular calcification. Pulmonic Valve: The pulmonic valve was grossly normal. Pulmonic valve regurgitation is mild. Pulmonic regurgitation is mild. Aorta: The aortic root is normal in size and structure. Venous: The inferior vena cava is dilated in size with less than 50% respiratory variability, suggesting right atrial pressure of 15 mmHg. IAS/Shunts: The interatrial septum was not well visualized. Additional Comments: A pacer wire is visualized.  LEFT VENTRICLE PLAX 2D LVIDd:         7.22 cm LVIDs:         6.14 cm LV PW:         0.99 cm LV IVS:        1.11 cm LVOT diam:     2.30 cm LV SV:         84 ml LV SV Index:   43.10 LVOT Area:     4.15 cm  RIGHT VENTRICLE            IVC RV Basal diam:  4.13 cm    IVC diam: 2.53 cm RV Mid diam:    2.20 cm RV S prime:     9.79 cm/s TAPSE (M-mode): 2.1 cm LEFT ATRIUM  Index       RIGHT ATRIUM           Index LA diam:        6.40 cm  3.35 cm/m  RA Area:     32.90 cm LA Vol (A2C):   170.0 ml 89.06 ml/m RA Volume:   121.50 ml 63.65 ml/m LA Vol (A4C):   134.0 ml 70.20 ml/m LA Biplane Vol: 152.0 ml 79.63 ml/m  AORTIC VALVE LVOT Vmax:   46.70 cm/s LVOT Vmean:  28.100 cm/s LVOT VTI:    0.063 m  AORTA Ao Root diam: 3.60 cm Ao Asc diam:  3.50 cm MITRAL VALVE                       TRICUSPID VALVE MV Area (PHT): 3.91 cm            TR Peak grad:   24.7 mmHg MV PHT:        56.26 msec          TR Vmax:        257.00 cm/s MV Decel Time: 194 msec MV E velocity: 66.45 cm/s 103 cm/s SHUNTS                                    Systemic VTI:  0.06 m                                    Systemic Diam: 2.30 cm  Kate Sable MD Electronically signed by  Kate Sable MD Signature Date/Time: 12/09/2019/10:32:42 AM    Final    [place] EKG SITE RITE  Result Date: 12/09/2019 If Site Rite image not attached, placement could not be confirmed due to current cardiac rhythm.   Time coordinating discharge: Over 30 minutes  SIGNED:   Guilford Shi, MD  [facility] 12/27/2019, 1:55 PM Pager : [phone]

## 2019-12-27 NOTE — Consult Note (Signed)
   [facility] CM Inpatient Consult   12/27/2019  Jeremy Parrish [birth year] [id number]    Follow-Up Note:  Following disposition of this Medicare/ [employer] patient with high risk score for unplanned readmission.  Chart reviewed reveals inpatient transition of care CM note states that patient is discharging home with Hospice care  ([place]).  No [facility]) Community follow up needed, as patient will have full care under Hospice.   For questions and additional information, please contact:  Taysha Majewski A. Irasema Chalk, BSN, RN-BC [facility] [place] Liaison Cell: [phone]

## 2019-12-27 NOTE — TOC Transition Note (Signed)
Transition of Care [facility]) - CM/SW Discharge Note   Patient Details  Name: TIMMOTHY NONNEMACHER MRN: [id number] Date of Birth: 04-14-1941  Transition of Care [facility]) CM/SW Contact:  Pollie Friar, RN Phone Number: 12/27/2019, 2:20 PM   Clinical Narrative:    Pt [facility] home [facility] aware of d/c.  Pt to transport via [facility]. Report called, bedside RN aware. Wife is at the beside and aware of plans.  D/c packet in the patients chart.    Final next level of care: Home w Hospice Care Barriers to Discharge: No Barriers Identified   Patient Goals and CMS Choice Patient states their goals for this hospitalization and ongoing recovery are:: to g home w home hospice CMS Medicare.gov Compare Post Acute Care list provided to:: Other (Comment Required) Choice offered to / list presented to : Spouse  Discharge Placement                       Discharge Plan and Services   Discharge Planning Services: CM Consult                        [facility] Agency: [facility] Date [place]: 12/27/19 Time [place]: [phone] Representative spoke with at [place]: [place] aware of d/c home  Social Determinants of Health (SDOH) Interventions     Readmission Risk Interventions No flowsheet data found.

## 2019-12-30 ENCOUNTER — Other Ambulatory Visit: Payer: Self-pay | Admitting: *Deleted

## 2019-12-30 ENCOUNTER — Telehealth: Payer: Self-pay | Admitting: Cardiovascular Disease

## 2019-12-30 NOTE — Telephone Encounter (Signed)
Patient's wife calling in to see if patients defib has been deactivated. She stated that while in the hospital there was talk of deactivating it but she never heard anything else.

## 2019-12-30 NOTE — Telephone Encounter (Signed)
Discussed with Mrs. Robello.   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 I re-assured her that in the event of his passing, his pacemaker would not prevent him from going peacefully. She was thankful for the call and had no further questions at this time.   Legrand Como [address]" [place], [place]  12/30/2019 3:56 PM

## 2019-12-30 NOTE — Patient Outreach (Signed)
[facility] Care Management  12/30/2019  UGO BOURN Jun 01, 1941 [id number]    EMMI-GENERAL DISCHARGE DAY# 1 DATE: 12/29/2019  OUTREACH#1  RN spoke with pt's primary caregiver (spouse) who indicated pt is now with Hospice services. Wife has spoke with the CAD providers with no additional inquired.   PLAN: Will close as pt is with an external case management services at this time.  Raina Mina, RN Care Management Coordinator [place] Office [phone]

## 2020-01-03 ENCOUNTER — Other Ambulatory Visit: Payer: Self-pay | Admitting: *Deleted

## 2020-01-03 NOTE — Patient Outreach (Signed)
[facility]) Care Management  01/03/2020  Jeremy Parrish Aug 01, 1941 [id number]    Pt expired on yesterday reported by the son. No EMMI (01/01/2020)to address at this time case will be closed.  Raina Mina, RN Care Management Coordinator [place] Office [phone]

## 2020-01-06 ENCOUNTER — Inpatient Hospital Stay (HOSPITAL_COMMUNITY): Payer: Medicare Other

## 2020-01-10 DEATH — deceased

## 2020-01-12 ENCOUNTER — Ambulatory Visit (HOSPITAL_COMMUNITY): Payer: Medicare Other

## 2020-01-12 ENCOUNTER — Ambulatory Visit (HOSPITAL_COMMUNITY): Payer: Medicare Other | Admitting: Hematology

## 2020-01-14 ENCOUNTER — Ambulatory Visit: Payer: Medicare Other | Admitting: Cardiovascular Disease
